# Patient Record
Sex: Male | Born: 1970 | Race: Black or African American | Hispanic: No | Marital: Married | Smoking: Never smoker
Health system: Southern US, Community
[De-identification: ages and names within clinical notes are randomized; demographics above are authoritative.]

## PROBLEM LIST (undated history)

## (undated) DIAGNOSIS — F319 Bipolar disorder, unspecified: Secondary | ICD-10-CM

## (undated) DIAGNOSIS — G629 Polyneuropathy, unspecified: Secondary | ICD-10-CM

## (undated) DIAGNOSIS — R0600 Dyspnea, unspecified: Secondary | ICD-10-CM

## (undated) DIAGNOSIS — G459 Transient cerebral ischemic attack, unspecified: Secondary | ICD-10-CM

## (undated) DIAGNOSIS — Z531 Procedure and treatment not carried out because of patient's decision for reasons of belief and group pressure: Secondary | ICD-10-CM

## (undated) DIAGNOSIS — J45909 Unspecified asthma, uncomplicated: Secondary | ICD-10-CM

## (undated) DIAGNOSIS — I6529 Occlusion and stenosis of unspecified carotid artery: Secondary | ICD-10-CM

## (undated) DIAGNOSIS — I5032 Chronic diastolic (congestive) heart failure: Secondary | ICD-10-CM

## (undated) DIAGNOSIS — E785 Hyperlipidemia, unspecified: Secondary | ICD-10-CM

## (undated) DIAGNOSIS — F32A Depression, unspecified: Secondary | ICD-10-CM

## (undated) DIAGNOSIS — K219 Gastro-esophageal reflux disease without esophagitis: Secondary | ICD-10-CM

## (undated) DIAGNOSIS — F329 Major depressive disorder, single episode, unspecified: Secondary | ICD-10-CM

## (undated) DIAGNOSIS — H409 Unspecified glaucoma: Secondary | ICD-10-CM

## (undated) DIAGNOSIS — E119 Type 2 diabetes mellitus without complications: Secondary | ICD-10-CM

## (undated) DIAGNOSIS — G4700 Insomnia, unspecified: Secondary | ICD-10-CM

## (undated) DIAGNOSIS — R079 Chest pain, unspecified: Secondary | ICD-10-CM

## (undated) DIAGNOSIS — E559 Vitamin D deficiency, unspecified: Secondary | ICD-10-CM

## (undated) DIAGNOSIS — D649 Anemia, unspecified: Secondary | ICD-10-CM

## (undated) DIAGNOSIS — K922 Gastrointestinal hemorrhage, unspecified: Secondary | ICD-10-CM

## (undated) DIAGNOSIS — I10 Essential (primary) hypertension: Secondary | ICD-10-CM

## (undated) DIAGNOSIS — F418 Other specified anxiety disorders: Secondary | ICD-10-CM

## (undated) DIAGNOSIS — K2900 Acute gastritis without bleeding: Secondary | ICD-10-CM

## (undated) DIAGNOSIS — IMO0001 Reserved for inherently not codable concepts without codable children: Secondary | ICD-10-CM

## (undated) HISTORY — PX: INGUINAL HERNIA REPAIR: SUR1180

## (undated) HISTORY — PX: TONSILLECTOMY: SUR1361

## (undated) HISTORY — DX: Unspecified glaucoma: H40.9

## (undated) HISTORY — DX: Acute gastritis without bleeding: K29.00

## (undated) HISTORY — PX: CIRCUMCISION: SUR203

---

## 2014-08-25 ENCOUNTER — Emergency Department (HOSPITAL_COMMUNITY): Payer: Self-pay

## 2014-08-25 ENCOUNTER — Encounter (HOSPITAL_COMMUNITY): Payer: Self-pay | Admitting: Emergency Medicine

## 2014-08-25 ENCOUNTER — Inpatient Hospital Stay (HOSPITAL_COMMUNITY)
Admission: EM | Admit: 2014-08-25 | Discharge: 2014-08-26 | DRG: 069 | Disposition: A | Discharge status: Home / Self Care | Payer: Self-pay | Attending: Internal Medicine | Admitting: Internal Medicine

## 2014-08-25 ENCOUNTER — Observation Stay (HOSPITAL_COMMUNITY): Payer: Self-pay

## 2014-08-25 DIAGNOSIS — I739 Peripheral vascular disease, unspecified: Secondary | ICD-10-CM | POA: Diagnosis present

## 2014-08-25 DIAGNOSIS — R531 Weakness: Secondary | ICD-10-CM | POA: Insufficient documentation

## 2014-08-25 DIAGNOSIS — G44011 Episodic cluster headache, intractable: Secondary | ICD-10-CM

## 2014-08-25 DIAGNOSIS — R519 Headache, unspecified: Secondary | ICD-10-CM

## 2014-08-25 DIAGNOSIS — K921 Melena: Secondary | ICD-10-CM | POA: Diagnosis present

## 2014-08-25 DIAGNOSIS — I160 Hypertensive urgency: Secondary | ICD-10-CM | POA: Diagnosis present

## 2014-08-25 DIAGNOSIS — G459 Transient cerebral ischemic attack, unspecified: Principal | ICD-10-CM

## 2014-08-25 DIAGNOSIS — R51 Headache: Secondary | ICD-10-CM

## 2014-08-25 DIAGNOSIS — E119 Type 2 diabetes mellitus without complications: Secondary | ICD-10-CM

## 2014-08-25 DIAGNOSIS — R471 Dysarthria and anarthria: Secondary | ICD-10-CM | POA: Diagnosis present

## 2014-08-25 DIAGNOSIS — M6289 Other specified disorders of muscle: Secondary | ICD-10-CM

## 2014-08-25 DIAGNOSIS — I10 Essential (primary) hypertension: Secondary | ICD-10-CM

## 2014-08-25 DIAGNOSIS — I2510 Atherosclerotic heart disease of native coronary artery without angina pectoris: Secondary | ICD-10-CM | POA: Diagnosis present

## 2014-08-25 DIAGNOSIS — Z9114 Patient's other noncompliance with medication regimen: Secondary | ICD-10-CM | POA: Diagnosis present

## 2014-08-25 DIAGNOSIS — E1169 Type 2 diabetes mellitus with other specified complication: Secondary | ICD-10-CM

## 2014-08-25 DIAGNOSIS — I6529 Occlusion and stenosis of unspecified carotid artery: Secondary | ICD-10-CM | POA: Insufficient documentation

## 2014-08-25 DIAGNOSIS — E785 Hyperlipidemia, unspecified: Secondary | ICD-10-CM | POA: Insufficient documentation

## 2014-08-25 DIAGNOSIS — E876 Hypokalemia: Secondary | ICD-10-CM | POA: Diagnosis present

## 2014-08-25 DIAGNOSIS — G8194 Hemiplegia, unspecified affecting left nondominant side: Secondary | ICD-10-CM | POA: Diagnosis present

## 2014-08-25 DIAGNOSIS — Z79899 Other long term (current) drug therapy: Secondary | ICD-10-CM

## 2014-08-25 DIAGNOSIS — G451 Carotid artery syndrome (hemispheric): Secondary | ICD-10-CM | POA: Insufficient documentation

## 2014-08-25 DIAGNOSIS — I639 Cerebral infarction, unspecified: Secondary | ICD-10-CM

## 2014-08-25 HISTORY — DX: Essential (primary) hypertension: I10

## 2014-08-25 LAB — CREATININE, SERUM: CREATININE: 0.72 mg/dL (ref 0.50–1.35)

## 2014-08-25 LAB — I-STAT CHEM 8, ED
BUN: 8 mg/dL (ref 6–23)
CALCIUM ION: 1.21 mmol/L (ref 1.12–1.23)
CREATININE: 0.8 mg/dL (ref 0.50–1.35)
Chloride: 102 mmol/L (ref 96–112)
Glucose, Bld: 291 mg/dL — ABNORMAL HIGH (ref 70–99)
HCT: 46 % (ref 39.0–52.0)
Hemoglobin: 15.6 g/dL (ref 13.0–17.0)
Potassium: 4 mmol/L (ref 3.5–5.1)
SODIUM: 141 mmol/L (ref 135–145)
TCO2: 27 mmol/L (ref 0–100)

## 2014-08-25 LAB — CBC WITH DIFFERENTIAL/PLATELET
Basophils Absolute: 0 10*3/uL (ref 0.0–0.1)
Basophils Relative: 1 % (ref 0–1)
EOS PCT: 1 % (ref 0–5)
Eosinophils Absolute: 0 10*3/uL (ref 0.0–0.7)
HCT: 42.1 % (ref 39.0–52.0)
Hemoglobin: 14.3 g/dL (ref 13.0–17.0)
LYMPHS ABS: 2 10*3/uL (ref 0.7–4.0)
Lymphocytes Relative: 31 % (ref 12–46)
MCH: 30 pg (ref 26.0–34.0)
MCHC: 34 g/dL (ref 30.0–36.0)
MCV: 88.3 fL (ref 78.0–100.0)
MONOS PCT: 6 % (ref 3–12)
Monocytes Absolute: 0.4 10*3/uL (ref 0.1–1.0)
NEUTROS ABS: 4.1 10*3/uL (ref 1.7–7.7)
Neutrophils Relative %: 61 % (ref 43–77)
Platelets: 210 10*3/uL (ref 150–400)
RBC: 4.77 MIL/uL (ref 4.22–5.81)
RDW: 12.4 % (ref 11.5–15.5)
WBC: 6.6 10*3/uL (ref 4.0–10.5)

## 2014-08-25 LAB — GLUCOSE, CAPILLARY
GLUCOSE-CAPILLARY: 210 mg/dL — AB (ref 70–99)
Glucose-Capillary: 163 mg/dL — ABNORMAL HIGH (ref 70–99)

## 2014-08-25 LAB — TROPONIN I

## 2014-08-25 LAB — I-STAT TROPONIN, ED: Troponin i, poc: 0 ng/mL (ref 0.00–0.08)

## 2014-08-25 MED ORDER — HYDRALAZINE HCL 20 MG/ML IJ SOLN
10.0000 mg | Freq: Four times a day (QID) | INTRAMUSCULAR | Status: DC | PRN
  Administered 2014-08-26: 10 mg via INTRAVENOUS
  Filled 2014-08-25: qty 1

## 2014-08-25 MED ORDER — ASPIRIN 81 MG PO CHEW
324.0000 mg | CHEWABLE_TABLET | Freq: Once | ORAL | Status: DC
  Filled 2014-08-25: qty 4

## 2014-08-25 MED ORDER — CARVEDILOL 6.25 MG PO TABS: 6.2500 mg | ORAL_TABLET | Freq: Two times a day (BID) | ORAL | Status: DC

## 2014-08-25 MED ORDER — GUAIFENESIN-DM 100-10 MG/5ML PO SYRP: 5.0000 mL | ORAL_SOLUTION | ORAL | Status: DC | PRN

## 2014-08-25 MED ORDER — HEPARIN SODIUM (PORCINE) 5000 UNIT/ML IJ SOLN
5000.0000 [IU] | Freq: Three times a day (TID) | INTRAMUSCULAR | Status: DC
  Administered 2014-08-25 – 2014-08-26 (×4): 5000 [IU] via SUBCUTANEOUS
  Filled 2014-08-25 (×4): qty 1

## 2014-08-25 MED ORDER — SODIUM CHLORIDE 0.9 % IJ SOLN
3.0000 mL | Freq: Two times a day (BID) | INTRAMUSCULAR | Status: DC
  Administered 2014-08-26: 3 mL via INTRAVENOUS

## 2014-08-25 MED ORDER — CARVEDILOL 3.125 MG PO TABS
3.1250 mg | ORAL_TABLET | Freq: Two times a day (BID) | ORAL | Status: DC
  Filled 2014-08-25 (×2): qty 1

## 2014-08-25 MED ORDER — MORPHINE SULFATE 4 MG/ML IJ SOLN
4.0000 mg | Freq: Once | INTRAMUSCULAR | Status: AC
  Administered 2014-08-25: 4 mg via INTRAVENOUS
  Filled 2014-08-25: qty 1

## 2014-08-25 MED ORDER — HYDRALAZINE HCL 20 MG/ML IJ SOLN: 10.0000 mg | Freq: Four times a day (QID) | INTRAMUSCULAR | Status: DC | PRN

## 2014-08-25 MED ORDER — HYDRALAZINE HCL 20 MG/ML IJ SOLN
5.0000 mg | Freq: Once | INTRAMUSCULAR | Status: AC
  Administered 2014-08-25: 5 mg via INTRAVENOUS
  Filled 2014-08-25: qty 1

## 2014-08-25 MED ORDER — ASPIRIN 81 MG PO CHEW
81.0000 mg | CHEWABLE_TABLET | Freq: Every day | ORAL | Status: DC
  Administered 2014-08-25 – 2014-08-26 (×2): 81 mg via ORAL
  Filled 2014-08-25: qty 1

## 2014-08-25 MED ORDER — ACETAMINOPHEN 325 MG PO TABS
650.0000 mg | ORAL_TABLET | ORAL | Status: DC | PRN
  Administered 2014-08-25 – 2014-08-26 (×3): 650 mg via ORAL
  Filled 2014-08-25 (×3): qty 2

## 2014-08-25 MED ORDER — STROKE: EARLY STAGES OF RECOVERY BOOK
Freq: Once | Status: AC
  Administered 2014-08-25: 18:00:00
  Filled 2014-08-25: qty 1

## 2014-08-25 MED ORDER — NITROGLYCERIN 0.4 MG SL SUBL
0.4000 mg | SUBLINGUAL_TABLET | SUBLINGUAL | Status: DC | PRN
  Administered 2014-08-25 – 2014-08-26 (×2): 0.4 mg via SUBLINGUAL
  Filled 2014-08-25 (×2): qty 1

## 2014-08-25 MED ORDER — ONDANSETRON HCL 4 MG PO TABS
4.0000 mg | ORAL_TABLET | Freq: Four times a day (QID) | ORAL | Status: DC | PRN
Start: 2014-08-25 — End: 2014-08-26

## 2014-08-25 MED ORDER — INSULIN GLARGINE 100 UNIT/ML ~~LOC~~ SOLN
10.0000 [IU] | Freq: Every day | SUBCUTANEOUS | Status: DC
  Administered 2014-08-25 – 2014-08-26 (×2): 10 [IU] via SUBCUTANEOUS
  Filled 2014-08-25 (×2): qty 0.1

## 2014-08-25 MED ORDER — SODIUM CHLORIDE 0.9 % IV BOLUS (SEPSIS)
1000.0000 mL | Freq: Once | INTRAVENOUS | Status: AC
  Administered 2014-08-25: 1000 mL via INTRAVENOUS

## 2014-08-25 MED ORDER — ALUM & MAG HYDROXIDE-SIMETH 200-200-20 MG/5ML PO SUSP: 30.0000 mL | Freq: Four times a day (QID) | ORAL | Status: DC | PRN

## 2014-08-25 MED ORDER — INSULIN ASPART 100 UNIT/ML ~~LOC~~ SOLN
0.0000 [IU] | Freq: Three times a day (TID) | SUBCUTANEOUS | Status: DC
  Administered 2014-08-25 – 2014-08-26 (×2): 3 [IU] via SUBCUTANEOUS
  Administered 2014-08-26: 2 [IU] via SUBCUTANEOUS
  Administered 2014-08-26: 5 [IU] via SUBCUTANEOUS

## 2014-08-25 MED ORDER — ONDANSETRON HCL 4 MG/2ML IJ SOLN: 4.0000 mg | Freq: Four times a day (QID) | INTRAMUSCULAR | Status: DC | PRN

## 2014-08-25 MED ORDER — POLYETHYLENE GLYCOL 3350 17 G PO PACK: 17.0000 g | PACK | Freq: Every day | ORAL | Status: DC | PRN

## 2014-08-25 MED ORDER — AMLODIPINE BESYLATE 5 MG PO TABS: 10.0000 mg | ORAL_TABLET | Freq: Every day | ORAL | Status: DC

## 2014-08-25 NOTE — H&P (Addendum)
Patient Demographics  Toddrick Sanna, is a 44 y.o. male  MRN: [id number]   DOB - [birth year]  Admit Date - 08/25/2014  Outpatient Primary MD for the patient is No primary care provider on file.   With History of -  Past Medical History  Diagnosis Date  . Hypertension   . Diabetes mellitus without complication   . Coronary artery disease       Past Surgical History  Procedure Laterality Date  . Hernia repair      in for   Chief Complaint  Patient presents with  . Numbness  . Hypertension     HPI  Bethany Cumming  is a 44 y.o. male,  with history of hypertension and type 2 diabetes mellitus for which she's been out of medications for the last 7 months, he denies any knowledge of CAD although it's listed, he comes in with 18 hour history of mild generalized headache, left-sided chest pressure which is constant, nonradiating, no shortness of breath, no relation with exertion, he started experiencing some left-sided tingling and numbness last night, this morning he woke up and he had some weakness in the left leg as well, came to the ER where his blood pressure was over 200/110, head CT unremarkable, EKG nonacute, blood work stable I was called to admit the patient for hypertensive urgency/crisis.   Patient besides above dictated problems no other subjective complaints.    Review of Systems    In addition to the HPI above,   No Fever-chills, No Headache, No changes with Vision or hearing, No problems swallowing food or Liquids, As above Chest pain, Cough or Shortness of Breath, No Abdominal pain, No Nausea or Vommitting, Bowel movements are regular, No Blood  Urine, has noticed some blood in stool intermittently No dysuria, No new skin rashes or bruises, No new joints pains-aches,  As above weakness,  tingling, numbness , No recent weight gain or loss, No polyuria, polydypsia or polyphagia, No significant Mental Stressors.  A full 10 point Review of Systems was done, except as stated above, all other Review of Systems were negative.   Social History History  Substance Use Topics  . Smoking status: Never Smoker   . Smokeless tobacco: Never Used  . Alcohol Use: No      Family History Family History  Problem Relation Age of Onset  . Hypertension Mother   . Diabetes Mother   . Hyperlipidemia Mother   . Diabetes Father   . Hypertension Father   . Stroke Father       Prior to Admission medications   Medication Sig Start Date End Date Taking? Authorizing Provider  ibuprofen (ADVIL,MOTRIN) 200 MG tablet Take 400 mg by mouth every 6 (six) hours as needed for mild pain or moderate pain.   Yes Historical Provider, MD    No Known Allergies  Physical Exam  Vitals  Blood pressure 164/98, pulse 95, temperature 98.5 F (  36.9 C), temperature source Oral, resp. rate 16, SpO2 96 %.   1. General middle-aged African-American male lying in bed in NAD,     2. Normal affect and insight, Not Suicidal or Homicidal, Awake Alert, Oriented X 3.  3. No F.N deficits, ALL C.Nerves Intact, Strength 5/5 all 4 extremities he does have mild pronator drift on the left side, some tingling numbness in the left leg, Plantars down going.  4. Ears and Eyes appear Normal, Conjunctivae clear, PERRLA. Moist Oral Mucosa.  5. Supple Neck, No JVD, No cervical lymphadenopathy appriciated, No Carotid Bruits.  6. Symmetrical Chest wall movement, Good air movement bilaterally, CTAB.  7. RRR, No Gallops, Rubs or Murmurs, No Parasternal Heave.  8. Positive Bowel Sounds, Abdomen Soft, No tenderness, No organomegaly appriciated,No rebound -guarding or rigidity.  9.  No Cyanosis, Normal Skin Turgor, No Skin Rash or Bruise.  10. Good muscle tone,  joints appear normal , no effusions, Normal ROM.  11. No  Palpable Lymph Nodes in Neck or Axillae     Data Review  CBC  Recent Labs Lab 08/25/14 1406 08/25/14 1419  WBC 6.6  --   HGB 14.3 15.6  HCT 42.1 46.0  PLT 210  --   MCV 88.3  --   MCH 30.0  --   MCHC 34.0  --   RDW 12.4  --   LYMPHSABS 2.0  --   MONOABS 0.4  --   EOSABS 0.0  --   BASOSABS 0.0  --    ------------------------------------------------------------------------------------------------------------------  Chemistries   Recent Labs Lab 08/25/14 1419  NA 141  K 4.0  CL 102  GLUCOSE 291*  BUN 8  CREATININE 0.80   ------------------------------------------------------------------------------------------------------------------ CrCl cannot be calculated (Unknown ideal weight.). ------------------------------------------------------------------------------------------------------------------ No results for input(s): TSH, T4TOTAL, T3FREE, THYROIDAB in the last 72 hours.  Invalid input(s): FREET3   Coagulation profile No results for input(s): INR, PROTIME in the last 168 hours. ------------------------------------------------------------------------------------------------------------------- No results for input(s): DDIMER in the last 72 hours. -------------------------------------------------------------------------------------------------------------------  Cardiac Enzymes No results for input(s): CKMB, TROPONINI, MYOGLOBIN in the last 168 hours.  Invalid input(s): CK ------------------------------------------------------------------------------------------------------------------ Invalid input(s): POCBNP   ---------------------------------------------------------------------------------------------------------------  Urinalysis No results found for: COLORURINE, APPEARANCEUR, LABSPEC, PHURINE, GLUCOSEU, HGBUR, BILIRUBINUR, KETONESUR, PROTEINUR, UROBILINOGEN, NITRITE,  LEUKOCYTESUR  ----------------------------------------------------------------------------------------------------------------  Imaging results:   Dg Chest 2 View  08/25/2014   CLINICAL DATA:  Midsternal chest pain since last night. Night sweats. Hypertension. Diabetes. Nonsmoker.  EXAM: CHEST  2 VIEW  COMPARISON:  None.  FINDINGS: Midline trachea.  Normal heart size and mediastinal contours.  Sharp costophrenic angles.  No pneumothorax.  Clear lungs.  IMPRESSION: No active cardiopulmonary disease.   Electronically Signed   By: Abigail Miyamoto M.D.   On: 08/25/2014 15:02   Ct Head Wo Contrast  08/25/2014   CLINICAL DATA:  44 year old with headache and blurry vision  EXAM: CT HEAD WITHOUT CONTRAST  TECHNIQUE: Contiguous axial images were obtained from the base of the skull through the vertex without intravenous contrast.  COMPARISON:  None.  FINDINGS: No acute cortical infarct, hemorrhage, or mass lesion ispresent. Ventricles are of normal size. No significant extra-axial fluid collection is present. The paranasal sinuses andmastoid air cells are clear. The osseous skull is intact.  IMPRESSION: 1. Normal brain.   Electronically Signed   By: Kerby Moors M.D.   On: 08/25/2014 14:58    My personal review of EKG: Rhythm NSR, Rate 90 /min, mild LVH , no Acute ST changes  Assessment & Plan  1. Left-sided tingling numbness and weakness. This likely is TIA versus CVA, although hypertensive crisis cannot be completely ruled out. He will be admitted to a telemetry bed, CT head is unremarkable, we will initiate for stroke protocol, MRI MRA brain, echogram, carotid duplex, PT-OT-speech eval. Check A1c lipid panel, Aspirin 81 mg daily. Will request neurology to evaluate as well.   2. Left-sided chest pressure. Now almost resolved, this likely is due to elevated blood pressures, EKG is nonacute, cycle troponin, obtain echogram, 81 of aspirin. Hydralazine when necessary as needed for blood pressure above  200/100 as he is having chest pain.   3. DM type II. Out of medications for the last 7 months. Check A1c, low-dose Lantus and sliding scale for now.   4. Essential hypertension with possible hypertensive crisis/urgency. This could be reactionary to TIA/CVA, hence when necessary hydralazine only for now, if stroke is completely ruled out we will initiate scheduled blood pressure medications and bring blood pressure down gently.   5. Blood in stool intermittently. On exam no external hemorrhoids, no rectal mass, we will request him to follow with a GI physician post discharge and get a colonoscopy. His paternal uncle did have colon cancer. He is currently not anemic.    DVT Prophylaxis Heparin   AM Labs Ordered, also please review Full Orders  Family Communication: Admission, patients condition and plan of care including tests being ordered have been discussed with the patient  who indicates understanding and agree with the plan and Code Status.  Code Status Full  Likely DC to  home  Condition Fair  Time spent in minutes : 35    Willy Pinkerton K M.D on 08/25/2014 at 3:54 PM  Between 7am to 7pm - Pager - [phone]  After 7pm go to [email or web address] - password [facility]  [facility]  Office  [phone]

## 2014-08-25 NOTE — Consult Note (Signed)
Referring Physician: Dr. Ronnie Derby    Chief Complaint: left hemiparesis, left hemisensory impairment, dysarthria  HPI:                                                                                                                                         Shane [name] is an 44 y.o. male with a past medical history significant for HTN, DM, hyperlipidemia, CAD, poor adherence to treatment, admitted to [facility] for further evaluation of the above stated symptoms. Shane [name] stated that last night he started having a moderately severe HA that was later on followed by chest pain. He went to sleep and woke up in the middle of the night with numbness of the left arm. Went back to sleep, and when he woke again this morning the left leg was also numb and weak as well as the left arm. Wife noticed that he was slurring his words but no evidence of face weakness, no vertigo, double vision, difficulty swallowing, language or vision impairment. He presented to the ED this afternoon and was noted to have blood pressure was over 200/110. CT brain was personally reviewed and showed no acute abnormality. EKG showed no ischemia or atrial fibrillation. Of note, patient informed me that he hasn't been taking his BP medications for several months.  Date last known well: 08/25/14 Time last known well: unknown tPA Given: no, late presentation   Past Medical History  Diagnosis Date  . Hypertension   . Diabetes mellitus without complication   . Coronary artery disease     Past Surgical History  Procedure Laterality Date  . Hernia repair      Family History  Problem Relation Age of Onset  . Hypertension Mother   . Diabetes Mother   . Hyperlipidemia Mother   . Diabetes Father   . Hypertension Father   . Stroke Father    Social History:  reports that he has never smoked. He has never used smokeless tobacco. He reports that he does not drink alcohol or use illicit drugs. Family history: no epilepsy, brain tumors, or  brain aneurysms Allergies: No Known Allergies  Medications:                                                                                                                           Scheduled: .  stroke: mapping our early stages of  recovery book   Does not apply Once  . aspirin  81 mg Oral Daily  . heparin  5,000 Units Subcutaneous 3 times per day  . insulin aspart  0-9 Units Subcutaneous TID WC  . insulin glargine  10 Units Subcutaneous Daily  . sodium chloride  3 mL Intravenous Q12H    ROS:                                                                                                                                       History obtained from the patient and chart review  General ROS: negative for - chills, fatigue, fever, night sweats, weight gain or weight loss Psychological ROS: negative for - behavioral disorder, hallucinations, memory difficulties, mood swings or suicidal ideation Ophthalmic ROS: negative for - blurry vision, double vision, eye pain or loss of vision ENT ROS: negative for - epistaxis, nasal discharge, oral lesions, sore throat, tinnitus or vertigo Allergy and Immunology ROS: negative for - hives or itchy/watery eyes Hematological and Lymphatic ROS: negative for - bleeding problems, bruising or swollen lymph nodes Endocrine ROS: negative for - galactorrhea, hair pattern changes, polydipsia/polyuria or temperature intolerance Respiratory ROS: negative for - cough, hemoptysis, shortness of breath or wheezing Cardiovascular ROS: negative for - dyspnea on exertion, edema or irregular heartbeat Gastrointestinal ROS: negative for - abdominal pain, diarrhea, hematemesis, nausea/vomiting or stool incontinence Genito-Urinary ROS: negative for - dysuria, hematuria, incontinence or urinary frequency/urgency Musculoskeletal ROS: negative for - joint swelling Neurological ROS: as noted in HPI Dermatological ROS: negative for rash and skin lesion changes  Physical exam:  pleasant male in no apparent distress. Blood pressure 176/104, pulse 88, temperature 98.1 F (36.7 C), temperature source Oral, resp. rate 16, SpO2 100 %. Head: normocephalic. Neck: supple, no bruits, no JVD. Cardiac: no murmurs. Lungs: clear. Abdomen: soft, no tender, no mass. Extremities: no edema. Skin: no rash Neurologic Examination:                                                                                                      General: Mental Status: Alert, oriented, thought content appropriate.  Speech fluent without evidence of aphasia.  Able to follow 3 step commands without difficulty. Cranial Nerves: II: Discs flat bilaterally; Visual fields grossly normal, pupils equal, round, reactive to light and accommodation III,IV, VI: ptosis not present, extra-ocular motions intact bilaterally V,VII: smile symmetric, facial light touch sensation impaired in hen left face VIII: hearing normal bilaterally IX,X: uvula rises symmetrically XI: bilateral  shoulder shrug XII: midline tongue extension without atrophy or fasciculations Motor: Significant for mild left hemiparesis Tone and bulk:normal tone throughout; no atrophy noted Sensory: Pinprick and light touch diminished in the left side Deep Tendon Reflexes:  Right: Upper Extremity   Left: Upper extremity   biceps (C-5 to C-6) 2/4   biceps (C-5 to C-6) 2/4 tricep (C7) 2/4    triceps (C7) 2/4 Brachioradialis (C6) 2/4  Brachioradialis (C6) 2/4  Lower Extremity Lower Extremity  quadriceps (L-2 to L-4) 2/4   quadriceps (L-2 to L-4) 2/4 Achilles (S1) 2/4   Achilles (S1) 2/4  Plantars: Right: downgoing   Left: downgoing Cerebellar: normal finger-to-nose,  normal heel-to-shin test Gait:  No tested for safety reasons.  Results for orders placed or performed during the hospital encounter of 08/25/14 (from the past 48 hour(s))  CBC with Differential/Platelet     Status: None   Collection Time: 08/25/14  2:06 PM  Result Value  Ref Range   WBC 6.6 4.0 - 10.5 K/uL   RBC 4.77 4.22 - 5.81 MIL/uL   Hemoglobin 14.3 13.0 - 17.0 g/dL   HCT 42.1 39.0 - 52.0 %   MCV 88.3 78.0 - 100.0 fL   MCH 30.0 26.0 - 34.0 pg   MCHC 34.0 30.0 - 36.0 g/dL   RDW 12.4 11.5 - 15.5 %   Platelets 210 150 - 400 K/uL   Neutrophils Relative % 61 43 - 77 %   Neutro Abs 4.1 1.7 - 7.7 K/uL   Lymphocytes Relative 31 12 - 46 %   Lymphs Abs 2.0 0.7 - 4.0 K/uL   Monocytes Relative 6 3 - 12 %   Monocytes Absolute 0.4 0.1 - 1.0 K/uL   Eosinophils Relative 1 0 - 5 %   Eosinophils Absolute 0.0 0.0 - 0.7 K/uL   Basophils Relative 1 0 - 1 %   Basophils Absolute 0.0 0.0 - 0.1 K/uL  I-stat troponin, ED     Status: None   Collection Time: 08/25/14  2:17 PM  Result Value Ref Range   Troponin i, poc 0.00 0.00 - 0.08 ng/mL   Comment 3            Comment: Due to the release kinetics of cTnI, a negative result within the first hours of the onset of symptoms does not rule out myocardial infarction with certainty. If myocardial infarction is still suspected, repeat the test at appropriate intervals.   I-stat chem 8, ed     Status: Abnormal   Collection Time: 08/25/14  2:19 PM  Result Value Ref Range   Sodium 141 135 - 145 mmol/L   Potassium 4.0 3.5 - 5.1 mmol/L   Chloride 102 96 - 112 mmol/L   BUN 8 6 - 23 mg/dL   Creatinine, Ser 0.80 0.50 - 1.35 mg/dL   Glucose, Bld 291 (H) 70 - 99 mg/dL   Calcium, Ion 1.21 1.12 - 1.23 mmol/L   TCO2 27 0 - 100 mmol/L   Hemoglobin 15.6 13.0 - 17.0 g/dL   HCT 46.0 39.0 - 52.0 %  Creatinine, serum     Status: None   Collection Time: 08/25/14  3:48 PM  Result Value Ref Range   Creatinine, Ser 0.72 0.50 - 1.35 mg/dL   GFR calc non Af Amer >90 >90 mL/min   GFR calc Af Amer >90 >90 mL/min    Comment: (NOTE) The eGFR has been calculated using the CKD EPI equation. This calculation has not been validated in all clinical  situations. eGFR's persistently <90 mL/min signify possible Chronic Kidney Disease.    Dg  Chest 2 View  08/25/2014   CLINICAL DATA:  Midsternal chest pain since last night. Night sweats. Hypertension. Diabetes. Nonsmoker.  EXAM: CHEST  2 VIEW  COMPARISON:  None.  FINDINGS: Midline trachea.  Normal heart size and mediastinal contours.  Sharp costophrenic angles.  No pneumothorax.  Clear lungs.  IMPRESSION: No active cardiopulmonary disease.   Electronically Signed   By: Abigail Miyamoto M.D.   On: 08/25/2014 15:02   Ct Head Wo Contrast  08/25/2014   CLINICAL DATA:  44 year old with headache and blurry vision  EXAM: CT HEAD WITHOUT CONTRAST  TECHNIQUE: Contiguous axial images were obtained from the base of the skull through the vertex without intravenous contrast.  COMPARISON:  None.  FINDINGS: No acute cortical infarct, hemorrhage, or mass lesion ispresent. Ventricles are of normal size. No significant extra-axial fluid collection is present. The paranasal sinuses andmastoid air cells are clear. The osseous skull is intact.  IMPRESSION: 1. Normal brain.   Electronically Signed   By: Kerby Moors M.D.   On: 08/25/2014 14:58    Assessment: 45 y.o. male with acute onset mild left hemiparesis and hemisensory impairment in the context of a hypertensive crisis. Likely right subcortical lacunar infarct resulting of small vessel disease, poorly controlled HTN. CT brain unremarkable. Patient is out of the window for treatment with thrombolytics. Complete stroke work up. Initiate aspirin and statin after passing swallowing evaluation. Patient with poor adherence to treatment. Stroke team will follow up tomorrow.    Stroke Risk Factors - HTN, DM, hyperlipidemia  Plan: 1. HgbA1c, fasting lipid panel 2. MRI, MRA  of the brain without contrast 3. Echocardiogram 4. Carotid dopplers 5. Prophylactic therapy-aspirin after passing swallowing evaluation 6. Risk factor modification 7. Telemetry monitoring. 8. Frequent neuro checks 9. PT/OT SLP   Dorian Pod, MD [facility] [phone]  08/25/2014, 5:29 PM

## 2014-08-25 NOTE — ED Provider Notes (Signed)
CSN: [id number]     Arrival date & time 08/25/14  1324 History   First MD Initiated Contact with Patient 08/25/14 1330     Chief Complaint  Patient presents with  . Numbness  . Hypertension     (Consider location/radiation/quality/duration/timing/severity/associated sxs/prior Treatment) HPI   44 year old male with history of hypertension and diabetes brought here via EMS for evaluation of stroke symptoms. Patient report last night around 8 PM while he was sitting and watching TV he developed acute onset of sharp throbbing headache to his forehead that radiates to the back of his head. He then developed gradual onset of pain to his chest. Pain is described as a pressure heavy sensation to left side of chest, radiates down to his left arm and he also expressing tingling and numbness sensation to both left arm and left leg. Also endorsed lightheadedness and dizziness and diaphoretic. He took an ibuprofen and went to sleep.  He was having difficulty sleeping throughout the night and this morning symptoms still persist. He also endorsed blurred vision to both eyes ongoing for about a month. Currently endorses a 8 out of 10 persistent chest pain worsening with breathing. States EMS has not given him any specific treatment. He denies any history of heart disease but does endorse a family history of heart disease. He is a nonsmoker. Furthermore patient reports for the past 2 years he has had significant weight loss total of 75 pounds which he cannot account for. He also reports intermittent rectal bleeding with bright red blood. Last year was seen by his PCP and was recommended to see a GI specialist. He was scheduled for endoscopy and colonoscopy however he lost his job and cannot go for the procedure. He has no significant risk factors for PE or DVT.  No past medical history on file. No past surgical history on file. No family history on file. History  Substance Use Topics  . Smoking status: Not on  file  . Smokeless tobacco: Not on file  . Alcohol Use: Not on file    Review of Systems  All other systems reviewed and are negative.     Allergies  Review of patient's allergies indicates not on file.  Home Medications   Prior to Admission medications   Not on File   There were no vitals taken for this visit. Physical Exam  Constitutional: He is oriented to person, place, and time. He appears well-developed and well-nourished. No distress.  HENT:  Head: Atraumatic.  No facial droop  Eyes: Conjunctivae and EOM are normal. Pupils are equal, round, and reactive to light.  Neck: Normal range of motion. Neck supple.  No nuchal rigidity  Cardiovascular: Normal rate, regular rhythm and intact distal pulses.   Pulmonary/Chest: Effort normal and breath sounds normal. No respiratory distress. He has no wheezes. He exhibits tenderness (Tenderness to left anterior chest wall on palpation without crepitus or emphysema. No overlying skin changes.).  Abdominal: Soft. There is no tenderness.  Musculoskeletal: He exhibits no edema.  5/5 strength to all 4 extremities.  Neurological: He is alert and oriented to person, place, and time.  Neurologic exam:  Speech clear, pupils equal round reactive to light, extraocular movements intact  Normal peripheral visual fields Cranial nerves III through XII normal including no facial droop Follows commands, moves all extremities x4, normal strength to bilateral upper and lower extremities at all major muscle groups including grip Sensation normal to light touch and pinprick Coordination intact, no limb ataxia, finger-nose-finger normal  Rapid alternating movements normal No pronator drift Gait normal   Skin: No rash noted.  Psychiatric: He has a normal mood and affect.  Nursing note and vitals reviewed.   ED Course  Procedures (including critical care time)  Patient here with strokelike symptoms and chest pain. He has no obvious focal neuro  deficit on exam, mild left arm weakness as compare to right. He has reproducible left chest wall pain without any obvious causative factor. Per EMS, patient has initial blood pressure of 218/120 not receiving any specific treatment. His blood pressure is 166/99 in ER. Workup initiated.  3:10 PM Blood pressure is fairly increasing to 194/115. Will give hydralazine 5 mg.  A CT without acute changes, chest x-ray shows no active cardiopulmonary disease. Chest pain is reproducible, likely atypical. At this time plan to consult to have patient admitted for hypertensive urgency. He may need brain MRI to rule out stroke.  Care discussed with Dr. Aline Brochure.    3:26 PM Appreciated consult from [place], Dr. Deno Etienne who agrees to admit pt to obs, tele for further care.   2:35 PM HEART score of 3.    3:59 PM Hospitalist, Dr. Deno Etienne evaluate patient and felt that this is concerning for TIA/stroke. Patient does have a left arm drift. He request neurology consultation. I will consult neurology as requested.  4:52 PM I have consulted neurologist Dr. Armida Sans who will see and evaluate pt further.    Labs Review Labs Reviewed  I-STAT CHEM 8, ED - Abnormal; Notable for the following:    Glucose, Bld 291 (*)    All other components within normal limits  CBC WITH DIFFERENTIAL/PLATELET  Randolm Idol, ED    Imaging Review Dg Chest 2 View  08/25/2014   CLINICAL DATA:  Midsternal chest pain since last night. Night sweats. Hypertension. Diabetes. Nonsmoker.  EXAM: CHEST  2 VIEW  COMPARISON:  None.  FINDINGS: Midline trachea.  Normal heart size and mediastinal contours.  Sharp costophrenic angles.  No pneumothorax.  Clear lungs.  IMPRESSION: No active cardiopulmonary disease.   Electronically Signed   By: Abigail Miyamoto M.D.   On: 08/25/2014 15:02   Ct Head Wo Contrast  08/25/2014   CLINICAL DATA:  44 year old with headache and blurry vision  EXAM: CT HEAD WITHOUT CONTRAST  TECHNIQUE: Contiguous axial  images were obtained from the base of the skull through the vertex without intravenous contrast.  COMPARISON:  None.  FINDINGS: No acute cortical infarct, hemorrhage, or mass lesion ispresent. Ventricles are of normal size. No significant extra-axial fluid collection is present. The paranasal sinuses andmastoid air cells are clear. The osseous skull is intact.  IMPRESSION: 1. Normal brain.   Electronically Signed   By: Kerby Moors M.D.   On: 08/25/2014 14:58     EKG Interpretation   Date/Time:  Thursday August 25 2014 13:42:33 EST Ventricular Rate:  90 PR Interval:  164 QRS Duration: 92 QT Interval:  368 QTC Calculation: 450 R Axis:   76 Text Interpretation:  Sinus rhythm Consider left atrial enlargement  Consider left ventricular hypertrophy Baseline wander in lead(s) II III  aVR aVL aVF Confirmed by HARRISON  MD, FORREST ([id number]) on 08/25/2014 1:51:47  PM      MDM   Final diagnoses:  Headache  Hypertensive urgency    BP 178/103 mmHg  Pulse 89  Temp(Src) 97.7 F (36.5 C) (Oral)  Resp 16  SpO2 96%  I have reviewed nursing notes and vital signs. I personally reviewed the imaging  tests through PACS system  I reviewed available ER/hospitalization records thought the EMR     Domenic Moras, PA-C 08/25/14 [place], PA-C 08/25/14 1653  Pamella Pert, MD 08/25/14 1711

## 2014-08-25 NOTE — ED Notes (Addendum)
Pt brought to Ed via EMS due to stroke symptoms and left sided chest pain. At 2100 last night pt had sudden onset of headache progressed to left arm and leg numbness, no difficulty with walking per EMS. Pt reports taking some ibuprofen last night to see if that would make symptoms go away, however pt woke up at 0500 am this morning and symptoms have gotten worse. Blurred vision in right eye and left side of face is numb per EMS. Pt reports calling EMS this morning due to left sided sharp chest pain that radiates into his back, worse with deep breathing. BP 218/120, pt has hx of HTN, however has not taken his BP meds in over 7 months. Pt is also diabetic and does not take any medication for that, when his "sugar feels high" he takes some of his wifes metformin. CBG 262 for EMS. Pt also reports "intermittent bloody diarrhea" was told by his PCP in [place] before moving here that it was possible he has colon cancer, pt has also experienced a large amount of unintentional weight loss of 75 lbs in two years.

## 2014-08-26 DIAGNOSIS — G451 Carotid artery syndrome (hemispheric): Secondary | ICD-10-CM

## 2014-08-26 DIAGNOSIS — E785 Hyperlipidemia, unspecified: Secondary | ICD-10-CM

## 2014-08-26 DIAGNOSIS — R079 Chest pain, unspecified: Secondary | ICD-10-CM

## 2014-08-26 DIAGNOSIS — E1149 Type 2 diabetes mellitus with other diabetic neurological complication: Secondary | ICD-10-CM

## 2014-08-26 DIAGNOSIS — I6529 Occlusion and stenosis of unspecified carotid artery: Secondary | ICD-10-CM | POA: Insufficient documentation

## 2014-08-26 DIAGNOSIS — I10 Essential (primary) hypertension: Secondary | ICD-10-CM

## 2014-08-26 DIAGNOSIS — I6523 Occlusion and stenosis of bilateral carotid arteries: Secondary | ICD-10-CM

## 2014-08-26 LAB — CBC
HCT: 40.8 % (ref 39.0–52.0)
Hemoglobin: 13.7 g/dL (ref 13.0–17.0)
MCH: 29.5 pg (ref 26.0–34.0)
MCHC: 33.6 g/dL (ref 30.0–36.0)
MCV: 87.9 fL (ref 78.0–100.0)
PLATELETS: 219 10*3/uL (ref 150–400)
RBC: 4.64 MIL/uL (ref 4.22–5.81)
RDW: 12.7 % (ref 11.5–15.5)
WBC: 6.9 10*3/uL (ref 4.0–10.5)

## 2014-08-26 LAB — BASIC METABOLIC PANEL
Anion gap: 9 (ref 5–15)
BUN: 7 mg/dL (ref 6–23)
CHLORIDE: 105 mmol/L (ref 96–112)
CO2: 26 mmol/L (ref 19–32)
Calcium: 9 mg/dL (ref 8.4–10.5)
Creatinine, Ser: 0.69 mg/dL (ref 0.50–1.35)
GFR calc non Af Amer: >90 mL/min (ref >90)
Glucose, Bld: 228 mg/dL — ABNORMAL HIGH (ref 70–99)
Potassium: 3.3 mmol/L — ABNORMAL LOW (ref 3.5–5.1)
Sodium: 140 mmol/L (ref 135–145)

## 2014-08-26 LAB — LIPID PANEL
CHOLESTEROL: 203 mg/dL — AB (ref 0–200)
HDL: 39 mg/dL — ABNORMAL LOW (ref >39)
LDL CALC: 123 mg/dL — AB (ref 0–99)
Total CHOL/HDL Ratio: 5.2 RATIO
Triglycerides: 204 mg/dL — ABNORMAL HIGH (ref <150)
VLDL: 41 mg/dL — ABNORMAL HIGH (ref 0–40)

## 2014-08-26 LAB — HIV ANTIBODY (ROUTINE TESTING W REFLEX): HIV SCREEN 4TH GENERATION: NONREACTIVE

## 2014-08-26 LAB — GLUCOSE, CAPILLARY
Glucose-Capillary: 234 mg/dL — ABNORMAL HIGH (ref 70–99)
Glucose-Capillary: 270 mg/dL — ABNORMAL HIGH (ref 70–99)
Glucose-Capillary: 160 mg/dL — ABNORMAL HIGH (ref 70–99)

## 2014-08-26 MED ORDER — BLOOD GLUCOSE MONITOR KIT: PACK | Status: DC

## 2014-08-26 MED ORDER — ASPIRIN 81 MG PO CHEW: 81.0000 mg | CHEWABLE_TABLET | Freq: Every day | ORAL | Status: DC

## 2014-08-26 MED ORDER — GLUCERNA SHAKE PO LIQD
237.0000 mL | Freq: Two times a day (BID) | ORAL | Status: DC
  Administered 2014-08-26: 237 mL via ORAL

## 2014-08-26 MED ORDER — HYDROCHLOROTHIAZIDE 12.5 MG PO CAPS: 12.5000 mg | ORAL_CAPSULE | Freq: Every day | ORAL | Status: DC

## 2014-08-26 MED ORDER — PRAVASTATIN SODIUM 40 MG PO TABS: 40.0000 mg | ORAL_TABLET | Freq: Every day | ORAL | Status: DC

## 2014-08-26 MED ORDER — PNEUMOCOCCAL VAC POLYVALENT 25 MCG/0.5ML IJ INJ: 0.5000 mL | INJECTION | INTRAMUSCULAR | Status: DC

## 2014-08-26 MED ORDER — INFLUENZA VAC SPLIT QUAD 0.5 ML IM SUSY: 0.5000 mL | PREFILLED_SYRINGE | INTRAMUSCULAR | Status: DC

## 2014-08-26 MED ORDER — CARVEDILOL 12.5 MG PO TABS: 12.5000 mg | ORAL_TABLET | Freq: Two times a day (BID) | ORAL | Status: DC

## 2014-08-26 MED ORDER — LISINOPRIL 20 MG PO TABS: 40.0000 mg | ORAL_TABLET | Freq: Every day | ORAL | Status: DC

## 2014-08-26 MED ORDER — ATORVASTATIN CALCIUM 40 MG PO TABS
40.0000 mg | ORAL_TABLET | Freq: Every day | ORAL | Status: DC
  Administered 2014-08-26: 40 mg via ORAL
  Filled 2014-08-26: qty 1

## 2014-08-26 MED ORDER — METFORMIN HCL 500 MG PO TABS: 500.0000 mg | ORAL_TABLET | Freq: Two times a day (BID) | ORAL | Status: DC

## 2014-08-26 NOTE — Progress Notes (Signed)
Discharge instruction reviewed with patient/family. All questions answered at this time. Transport by family.    Sigmund Morera, RN 

## 2014-08-26 NOTE — Progress Notes (Signed)
  Echocardiogram 2D Echocardiogram has been performed.  Shane [name] 08/26/2014, 12:03 PM

## 2014-08-26 NOTE — Discharge Summary (Signed)
Physician Discharge Summary  Shane [name] GYI:[id number] DOB: [birth year] DOA: 08/25/2014  PCP: No primary care provider on file.  Admit date: 08/25/2014 Discharge date: 08/26/2014  Time spent: 30 minutes  Recommendations for Outpatient Follow-up:  1. Close follow up to patient DM and BP; adjust medications as needed 2. Arrange outpatient referral with GI for colonoscopy screening 3. Repeat BMET to reassess electrolytes and renal fucntion (as patient started on antihypertensive agents and metformin) 4. Repeat LFT's and lipid panel in 3 months and adjust statins medications as needed   Discharge Diagnoses:  Active Problems:   Hypertensive urgency   TIA (transient ischemic attack)   DM2 (diabetes mellitus, type 2) HLD Intermittent BRBPR   Discharge Condition: stable and improved. Patient discharge home with instruction to follow at [facility] center to establish care and received follow up appointment. He didn't qualify for HHPT given lack of insurance; but per PT ok to get a cane for assistance and received instruction on activities to improve strength and overcome deficit.  Diet recommendation: heart healthy and low carbohydrates   Filed Weights   08/26/14 0500  Weight: 77.9 kg (171 lb 11.8 oz)    History of present illness:  44 y.o. male, with history of hypertension and type 2 diabetes mellitus for which she's been out of medications for the last 7 months, he denies any knowledge of CAD although it's listed, he comes in with 18 hour history of mild generalized headache, left-sided chest pressure which is constant, nonradiating, no shortness of breath, no relation with exertion, he started experiencing some left-sided tingling and numbness last night, this morning he woke up and he had some weakness in the left leg as well, came to the ER where his blood pressure was over 200/110, head CT unremarkable, EKG nonacute, blood work stable I was called to admit the patient for hypertensive  urgency/crisis.   Hospital Course:  1-TIA: with left sided weakness and numbness; most likely due to small vessel disease due to uncontrolled HTN -risk factors modifications (HTN, HLD, DM) -ASA for secondary prevention -patient to follow with neurology in 2 months as an outpatient for further evaluation and rec's  2-hypertensive urgency/accelerated HTN: controlled with medications -started at discharge on [employer] menu drugs -patient advise to take medications as prescribed and to follow heart healthy diet -discharge regimen includes HCTZ 12.5 mg, lisinopril 46m and coreg 12.5 mg BID  3-diabetes mellitus type 2: patient started on metformin BID -[facility] -he has not been taking any meds for > 7 months -advise to follow low carb diet  4-HLD: started on pravachol -he will follow low fat diet  5-hx of intermittent episode of BRBPR: none seen during admission -will need outpatient GI referral for colonoscopy screening  Procedures:  Carotid dopplers:1-39% stenosis bilaterally. Antegrade vertebral flow bilaterally.   2-D Echo: - Left ventricle: The cavity size was normal. Wall thickness was increased in a pattern of moderate LVH. Systolic function was normal. The estimated ejection fraction was in the range of 55% to 60%. Wall motion was normal; there were no regional wall motion abnormalities. Doppler parameters are consistent with abnormal left ventricular relaxation (grade 1 diastolic dysfunction). - Aortic valve: There was no stenosis. - Mitral valve: There was no significant regurgitation. - Right ventricle: The cavity size was normal. Systolic function was normal. - Pulmonary arteries: No complete TR doppler jet so unable to estimate PA systolic pressure. - Inferior vena cava: The vessel was  normal in size. The respirophasic diameter changes were in the normal range (>= 50%), consistent  with normal central venous pressure.  Impressions: - Normal LV size with moderate LV hypertrophy. EF 55-60%. Normal RV size and systolic function. No significant valvular abnormalities.  Consultations:  Neurology   Discharge Exam: Filed Vitals:   08/26/14 1300  BP: 124/83  Pulse: 94  Temp: 97.7 F (36.5 C)  Resp: 16    General: no HA's, feeling better; denies CP or SOB. Patient still with mild left sided weakness  Cardiovascular: no rubs or gallops; no JVD and no murmurs appreciated Respiratory: CTA bilaterally Abd: soft, NT, ND, positive BS Extremities: no edema or cyanosis Neurologic exam: no dysarthria, positive left sided weakness (MS 4/5 upper and lower extremities), no other focal deficit appreciated.   Discharge Instructions   Discharge Instructions    Diet - low sodium heart healthy    Complete by:  As directed      Discharge instructions    Complete by:  As directed   Take medications as prescribed Follow a low sodium (< 2.5-3 grams daily) and low carbohydrates diet Follow with Dr. Leonie Man (neurology in 2 months) Follow at [facility] center as instructed Please keep yourself well hydrated Check blood sugar BID (fasting and before bedtime)     Increase activity slowly    Complete by:  As directed           Current Discharge Medication List    START taking these medications   Details  aspirin 81 MG chewable tablet Chew 1 tablet (81 mg total) by mouth daily.    blood glucose meter kit and supplies KIT Dispense based on patient and insurance preference. Use up to four times daily as directed. (FOR ICD-9 250.00, 250.01). Qty: 1 each, Refills: 0    carvedilol (COREG) 12.5 MG tablet Take 1 tablet (12.5 mg total) by mouth 2 (two) times daily with a meal. Qty: 60 tablet, Refills: 1    hydrochlorothiazide (MICROZIDE) 12.5 MG capsule Take 1 capsule (12.5 mg total) by mouth daily. Qty: 30 capsule, Refills: 1    lisinopril (PRINIVIL,ZESTRIL) 20 MG tablet  Take 2 tablets (40 mg total) by mouth daily. Qty: 60 tablet, Refills: 1    metFORMIN (GLUCOPHAGE) 500 MG tablet Take 1 tablet (500 mg total) by mouth 2 (two) times daily with a meal. Qty: 60 tablet, Refills: 1    pravastatin (PRAVACHOL) 40 MG tablet Take 1 tablet (40 mg total) by mouth daily. Qty: 30 tablet, Refills: 1      STOP taking these medications     ibuprofen (ADVIL,MOTRIN) 200 MG tablet        No Known Allergies Follow-up Information    Follow up with [facility] In 1 week.   Why:  Walk-in appointments M-F, arrive at 8:30am. Bring all discharge paperwork to your appointment.   Contact information:   [address] [place] [address] [phone]      Follow up with SETHI,PRAMOD, MD. Call in 2 months.   Specialties:  Neurology, Radiology   Why:  contact office around this time to set up appointment   Contact information:   [address] [place] [address] [phone]       The results of significant diagnostics from this hospitalization (including imaging, microbiology, ancillary and laboratory) are listed below for reference.    Significant Diagnostic Studies: Dg Chest 2 View  08/25/2014   CLINICAL DATA:  Midsternal  chest pain since last night. Night sweats. Hypertension. Diabetes. Nonsmoker.  EXAM: CHEST  2 VIEW  COMPARISON:  None.  FINDINGS: Midline trachea.  Normal heart size and mediastinal contours.  Sharp costophrenic angles.  No pneumothorax.  Clear lungs.  IMPRESSION: No active cardiopulmonary disease.   Electronically Signed   By: Abigail Miyamoto M.D.   On: 08/25/2014 15:02   Ct Head Wo Contrast  08/25/2014   CLINICAL DATA:  44 year old with headache and blurry vision  EXAM: CT HEAD WITHOUT CONTRAST  TECHNIQUE: Contiguous axial images were obtained from the base of the skull through the vertex without intravenous contrast.  COMPARISON:  None.  FINDINGS: No acute cortical infarct, hemorrhage,  or mass lesion ispresent. Ventricles are of normal size. No significant extra-axial fluid collection is present. The paranasal sinuses andmastoid air cells are clear. The osseous skull is intact.  IMPRESSION: 1. Normal brain.   Electronically Signed   By: Kerby Moors M.D.   On: 08/25/2014 14:58   Mr Brain Wo Contrast  08/25/2014   CLINICAL DATA:  44 year old male with headache and right side weakness. Initial encounter.  EXAM: MRI HEAD WITHOUT CONTRAST  MRA HEAD WITHOUT CONTRAST  TECHNIQUE: Multiplanar, multiecho pulse sequences of the brain and surrounding structures were obtained without intravenous contrast. Angiographic images of the head were obtained using MRA technique without contrast.  COMPARISON:  Head CT without contrast 1446 hours today.  FINDINGS: MRI HEAD FINDINGS  Cerebral volume is normal. No restricted diffusion to suggest acute infarction. No midline shift, mass effect, evidence of mass lesion, ventriculomegaly, extra-axial collection or acute intracranial hemorrhage. Cervicomedullary junction and pituitary are within normal limits. Negative visualized cervical spine. Major intracranial vascular flow voids are within normal limits. [place] Cables and white matter signal is within normal limits throughout the brain.  Visible internal auditory structures appear normal. Mastoids are clear. Trace paranasal sinus mucosal thickening. Visualized orbit soft tissues are within normal limits. Visualized scalp soft tissues are within normal limits. Small right retropharyngeal lymph node is within normal limits. Normal bone marrow signal.  MRA HEAD FINDINGS  Antegrade flow in the posterior circulation with codominant distal vertebral arteries. Normal right PICA origin. Patent vertebrobasilar junction. Patent AICA origins. No basilar stenosis. SCA and PCA origins are within normal limits. Normal right posterior communicating artery, the left is diminutive or absent. Bilateral PCA branches are within normal limits.   Antegrade flow in both ICA siphons. No siphon stenosis. Ophthalmic and right posterior communicating artery origins are within normal limits. Normal carotid termini, MCA and ACA origins. Normal anterior communicating artery and visualized bilateral ACA branches. Normal visualized bilateral MCA branches.  IMPRESSION: 1.  Normal noncontrast MRI appearance of the brain. 2.  Negative intracranial MRA.   Electronically Signed   By: Genevie Ann M.D.   On: 08/25/2014 19:47   Mr Jodene Nam Head/brain Wo Cm  08/25/2014   CLINICAL DATA:  44 year old male with headache and right side weakness. Initial encounter.  EXAM: MRI HEAD WITHOUT CONTRAST  MRA HEAD WITHOUT CONTRAST  TECHNIQUE: Multiplanar, multiecho pulse sequences of the brain and surrounding structures were obtained without intravenous contrast. Angiographic images of the head were obtained using MRA technique without contrast.  COMPARISON:  Head CT without contrast 1446 hours today.  FINDINGS: MRI HEAD FINDINGS  Cerebral volume is normal. No restricted diffusion to suggest acute infarction. No midline shift, mass effect, evidence of mass lesion, ventriculomegaly, extra-axial collection or acute intracranial hemorrhage. Cervicomedullary junction and pituitary are within normal limits. Negative visualized cervical  spine. Major intracranial vascular flow voids are within normal limits. [place] Cables and white matter signal is within normal limits throughout the brain.  Visible internal auditory structures appear normal. Mastoids are clear. Trace paranasal sinus mucosal thickening. Visualized orbit soft tissues are within normal limits. Visualized scalp soft tissues are within normal limits. Small right retropharyngeal lymph node is within normal limits. Normal bone marrow signal.  MRA HEAD FINDINGS  Antegrade flow in the posterior circulation with codominant distal vertebral arteries. Normal right PICA origin. Patent vertebrobasilar junction. Patent AICA origins. No basilar stenosis. SCA  and PCA origins are within normal limits. Normal right posterior communicating artery, the left is diminutive or absent. Bilateral PCA branches are within normal limits.  Antegrade flow in both ICA siphons. No siphon stenosis. Ophthalmic and right posterior communicating artery origins are within normal limits. Normal carotid termini, MCA and ACA origins. Normal anterior communicating artery and visualized bilateral ACA branches. Normal visualized bilateral MCA branches.  IMPRESSION: 1.  Normal noncontrast MRI appearance of the brain. 2.  Negative intracranial MRA.   Electronically Signed   By: Genevie Ann M.D.   On: 08/25/2014 19:47   Labs: Basic Metabolic Panel:  Recent Labs Lab 08/25/14 1419 08/25/14 1548 08/26/14 0505  NA 141  --  140  K 4.0  --  3.3*  CL 102  --  105  CO2  --   --  26  GLUCOSE 291*  --  228*  BUN 8  --  7  CREATININE 0.80 0.72 0.69  CALCIUM  --   --  9.0   CBC:  Recent Labs Lab 08/25/14 1406 08/25/14 1419 08/26/14 0505  WBC 6.6  --  6.9  NEUTROABS 4.1  --   --   HGB 14.3 15.6 13.7  HCT 42.1 46.0 40.8  MCV 88.3  --  87.9  PLT 210  --  219   Cardiac Enzymes:  Recent Labs Lab 08/25/14 2111  TROPONINI <0.03   CBG:  Recent Labs Lab 08/25/14 1731 08/25/14 2221 08/26/14 0646 08/26/14 1138 08/26/14 1656  GLUCAP 210* 163* 234* 270* 160*    Signed:  Barton Dubois  [facility] 08/26/2014, 5:17 PM

## 2014-08-26 NOTE — Progress Notes (Signed)
STROKE TEAM PROGRESS NOTE   HISTORY Shane [name] is an 44 y.o. male with a past medical history significant for HTN, DM, hyperlipidemia, CAD, poor adherence to treatment, admitted to [facility] for further evaluation of left hemiparesis, left hemisensory impairment, and dysarthria. Shane [name] stated that last night he started having a moderately severe HA that was later on followed by chest pain. He went to sleep and woke up in the middle of the night with numbness of the left arm. Went back to sleep, and when he woke again this morning the left leg was also numb and weak as well as the left arm. Wife noticed that he was slurring his words but no evidence of face weakness, no vertigo, double vision, difficulty swallowing, language or vision impairment. He presented to the ED this afternoon and was noted to have blood pressure was over 200/110. CT brain was personally reviewed and showed no acute abnormality. EKG showed no ischemia or atrial fibrillation. Of note, patient informed me that he hasn't been taking his BP medications for several months.  Date last known well: 08/25/14 Time last known well: unknown tPA Given: no, late presentation   SUBJECTIVE (INTERVAL HISTORY) No family members present. The patient reports that he stopped taking his medications months ago because he had lost his job and could not afford to pay for the medications.   OBJECTIVE Temp:  [97.6 F (36.4 C)-98.5 F (36.9 C)] 97.8 F (36.6 C) (03/11 0515) Pulse Rate:  [81-95] 92 (03/11 0515) Cardiac Rhythm:  [-] Normal sinus rhythm (03/10 2005) Resp:  [0-24] 14 (03/11 0515) BP: (130-194)/(82-115) 173/104 mmHg (03/11 0515) SpO2:  [96 %-100 %] 98 % (03/11 0515) Weight:  [77.9 kg (171 lb 11.8 oz)] 77.9 kg (171 lb 11.8 oz) (03/11 0500)   Recent Labs Lab 08/25/14 1731 08/25/14 2221 08/26/14 0646  GLUCAP 210* 163* 234*    Recent Labs Lab 08/25/14 1419 08/25/14 1548 08/26/14 0505  NA 141  --  140  K 4.0  --  3.3*   CL 102  --  105  CO2  --   --  26  GLUCOSE 291*  --  228*  BUN 8  --  7  CREATININE 0.80 0.72 0.69  CALCIUM  --   --  9.0   No results for input(s): AST, ALT, ALKPHOS, BILITOT, PROT, ALBUMIN in the last 168 hours.  Recent Labs Lab 08/25/14 1406 08/25/14 1419 08/26/14 0505  WBC 6.6  --  6.9  NEUTROABS 4.1  --   --   HGB 14.3 15.6 13.7  HCT 42.1 46.0 40.8  MCV 88.3  --  87.9  PLT 210  --  219    Recent Labs Lab 08/25/14 2111  TROPONINI <0.03   No results for input(s): LABPROT, INR in the last 72 hours. No results for input(s): COLORURINE, LABSPEC, [place], GLUCOSEU, HGBUR, BILIRUBINUR, KETONESUR, PROTEINUR, UROBILINOGEN, NITRITE, LEUKOCYTESUR in the last 72 hours.  Invalid input(s): APPERANCEUR     Component Value Date/Time   CHOL 203* 08/26/2014 0505   TRIG 204* 08/26/2014 0505   HDL 39* 08/26/2014 0505   CHOLHDL 5.2 08/26/2014 0505   VLDL 41* 08/26/2014 0505   LDLCALC 123* 08/26/2014 0505   No results found for: HGBA1C No results found for: LABOPIA, COCAINSCRNUR, LABBENZ, AMPHETMU, THCU, LABBARB  No results for input(s): ETH in the last 168 hours.  Dg Chest 2 View 08/25/2014    No active cardiopulmonary disease.     Ct Head Wo Contrast 08/25/2014  Normal brain.   MRI / MRA Brain Wo Contrast 08/25/2014    1.  Normal noncontrast MRI appearance of the brain.  2.  Negative intracranial MRA.        PHYSICAL EXAM Pleasant young african [place] male not in distress. . Afebrile. Head is nontraumatic. Neck is supple without bruit.    Cardiac exam no murmur or gallop. Lungs are clear to auscultation. Distal pulses are well felt. Neurological Exam ;  Awake  Alert oriented x 3. Normal speech and language.eye movements full without nystagmus.fundi were not visualized. Vision acuity and fields appear normal. Hearing is normal. Palatal movements are normal. Face asymmetric when smiles with left nasolabial droop.. Tongue midline. Normal strength, tone, reflexes and  coordination. Except for mild diminished fine finger movements on the left and orbits right over left upper extremity. Normal sensation. Gait deferred.    ASSESSMENT/PLAN Shane [name] is a 44 y.o. male with history of  HTN, DM, hyperlipidemia, CAD, poor adherence to treatment, presenting with  left hemiparesis, left hemisensory impairment, and dysarthria. Marland Kitchen He did not receive IV t-PA due to late presentation.   TIA:  Non-dominant small vessel disease.  Resultant  deficits resolved  MRI  no infarct  MRA  negative  Carotid Doppler  pending  2D Echo  pending  LDL 123  HgbA1c pending  Subcutaneous heparin for VTE prophylaxis  Diet Carb Modified with thin liquids  no antithrombotic prior to admission, now on aspirin 81 mg orally every day  Patient counseled to be compliant with his antithrombotic medications  Ongoing aggressive stroke risk factor management  Therapy recommendations:  Pending  (no follow-up occupational therapy)  Disposition:  Pending  Hypertension  Home meds:  No antihypertensive medications prior to admission.  Blood pressure running high.   Recommend treating   Hyperlipidemia  Home meds: No lipid lowering medications prior to admission  LDL 123, goal < 70  Add - Lipitor 40 mg daily  Continue statin at discharge  Diabetes  HgbA1c pending, goal < 7.0  Uncontrolled  Other Stroke Risk Factors  Family hx stroke (Father)  Coronary artery disease   Other Active Problems  Compliance issues  Hypokalemia   Plan  Okay to discharge when workup is complete  Follow-up Dr. Leonie Man in 2 months  Social worker consult for financial issues.   Hospital day # 1  Shane Bussing PA-C [facility] Pager [phone] 08/26/2014, 8:09 AM I have personally examined this patient, reviewed notes, independently viewed imaging studies, participated in medical decision making and plan of care. I have made any additions or  clarifications directly to the above note. Agree with note above. He presented with a left hemiparesis and sensory loss likely due to suspected small right brain subcortical infarct not visualized on MRI. Etiology likely small vessel disease. He remains at risk for recurrent strokes, TIAs and needs ongoing stroke evaluation as well as aggressive risk factor modification. Start aspirin. Patient counseled to be compliant with his medications and to seek medical care and maintain healthy lifestyle.  Antony Contras, MD Medical Director [facility] Pager: [phone] 08/26/2014 11:26 AM     To contact Stroke Continuity provider, please refer to [email or web address] After hours, contact General Neurology

## 2014-08-26 NOTE — Care Management Note (Signed)
    Page 1 of 1   08/26/2014     9:52:23 AM CARE MANAGEMENT NOTE 08/26/2014  Patient:  Shane [name], Shane [name]   Account Number:  [id number]  Date Initiated:  08/26/2014  Documentation initiated by:  Lorne Skeens  Subjective/Objective Assessment:   Patient was admitted with hypertensive urgency.  Lives at home with spouse.     Action/Plan:   Will follow for discharge needs. Patient with recent job loss, uninsured.  Reports medication non-compliance due to cost.   Anticipated DC Date:  08/26/2014   Anticipated DC Plan:  HOME/SELF CARE  In-house referral  [place]  CM consult  [facility]  Medication Assistance      Choice offered to / List presented to:             Status of service:  Completed, signed off Medicare Important Message given?   (If response is "NO", the following Medicare IM given date fields will be blank) Date Medicare IM given:   Medicare IM given by:   Date Additional Medicare IM given:   Additional Medicare IM given by:    Discharge Disposition:    Per UR Regulation:  Reviewed for med. necessity/level of care/duration of stay  If discussed at [place] of Stay Meetings, dates discussed:    Comments:  08/26/14 [place] RN, MSN, CM- Met with patient to discuss discharge needs. Patient reports that he does not have a PCP.  He is interested in following up at the [facility]. Literature was provided. Patient is aware that all new patients will be seen on a walk-in basis and should arrive at the clinic by 0830 Monday through Friday.  Dr Dyann Kief to address medication cost by selecting medications from the $4 generic list, as approriate.  [place] with financial counseling was notified of patient's admission and will contact patient prior to his probable discharge later today.  Patient verbalized understanding and is in agreement with the plan of care.

## 2014-08-26 NOTE — Evaluation (Signed)
Physical Therapy Evaluation Patient Details Name: Shane [name] MRN: [id number] DOB: April 04, 1971 Today's Date: 08/26/2014   History of Present Illness  TIA with L side weakness, numbness and tingling  Clinical Impression  Pt was seen for evaluation of his balance and stability on the hallway and to assess the need for AD.  Stairs with one rail with min guard and cues for safety.  Pt is planning to go home with wife who will be available to him all the time.  Will continue therapy until pt goes home, as HHPT is not an option due to insurance.     Follow Up Recommendations Home health PT;Supervision - Intermittent    Equipment Recommendations  Cane    Recommendations for Other Services OT consult     Precautions / Restrictions Precautions Precautions: Fall Restrictions Weight Bearing Restrictions: No      Mobility  Bed Mobility               General bed mobility comments: up when PT entered  Transfers Overall transfer level: Needs assistance Equipment used: 1 person hand held assist Transfers: Sit to/from Stand;Stand Pivot Transfers Sit to Stand: Min guard Stand pivot transfers: Min guard       General transfer comment: pt guarded with sit - stand and during ambulation with slow pace  Ambulation/Gait Ambulation/Gait assistance: Min guard Ambulation Distance (Feet): 150 Feet Assistive device: 1 person hand held assist Gait Pattern/deviations: Step-through pattern;Decreased stride length;Wide base of support;Drifts right/left Gait velocity: reduced Gait velocity interpretation: Below normal speed for age/gender    Stairs Stairs: Yes Stairs assistance: Min guard Stair Management: One rail Right;Forwards Number of Stairs: 10 General stair comments: needs extra time and supervision to safely manage stairs  Wheelchair Mobility    Modified Rankin (Stroke Patients Only)       Balance Overall balance assessment: Needs assistance Sitting-balance support:  Feet supported Sitting balance-Leahy Scale: Good   Postural control: Posterior lean Standing balance support: Single extremity supported Standing balance-Leahy Scale: Fair Standing balance comment: fair- dynamic standing                             Pertinent Vitals/Pain Pain Assessment: No/denies pain Pain Score: 0-No pain Pain Location: chest Pain Descriptors / Indicators: Pressure Pain Intervention(s): Monitored during session    Home Living Family/patient expects to be discharged to:: Private residence Living Arrangements: Spouse/significant other Available Help at Discharge: Family;Available 24 hours/day Type of Home: House Home Access: Stairs to enter Entrance Stairs-Rails: Right Entrance Stairs-Number of Steps: 2 Home Layout: One level Home Equipment: None      Prior Function Level of Independence: Independent         Comments: Previously working full-time     Journalist, newspaper   Dominant Hand: Right    Extremity/Trunk Assessment   Upper Extremity Assessment: LUE deficits/detail       LUE Deficits / Details: weakness with use for handrails on stairs   Lower Extremity Assessment: Generalized weakness      Cervical / Trunk Assessment: Normal  Communication   Communication: No difficulties  Cognition Arousal/Alertness: Awake/alert Behavior During Therapy: WFL for tasks assessed/performed Overall Cognitive Status: Within Functional Limits for tasks assessed                      General Comments General comments (skin integrity, edema, etc.): Pt demonstrates minor safety issues with need for SPC to steady at home.  Talked  with pt about HHPT but he cannot qualify as he is uninsured    Exercises Other Exercises Other Exercises: Jules Schick was 4- to 4 hips, knees 4+ to 5,  and ankles 4+      Assessment/Plan    PT Assessment Patient needs continued PT services  PT Diagnosis Generalized weakness   PT Problem List Decreased  strength;Decreased range of motion;Decreased activity tolerance;Decreased balance;Decreased mobility;Decreased coordination;Decreased knowledge of use of DME  PT Treatment Interventions DME instruction;Gait training;Stair training;Functional mobility training;Therapeutic activities;Therapeutic exercise;Balance training;Neuromuscular re-education;Patient/family education   PT Goals (Current goals can be found in the Care Plan section) Acute Rehab PT Goals Patient Stated Goal: go home PT Goal Formulation: With patient Time For Goal Achievement: 09/02/14 Potential to Achieve Goals: Good    Frequency Min 3X/week   Barriers to discharge   Needs SPC to manage home transition    Co-evaluation               End of Session Equipment Utilized During Treatment: Gait belt Activity Tolerance: Patient tolerated treatment well;Patient limited by lethargy Patient left: in chair;with call bell/phone within reach;with chair alarm set Nurse Communication: Mobility status    Functional Assessment Tool Used: clinical judgment Functional Limitation: Mobility: Walking and moving around Mobility: Walking and Moving Around Current Status [phone]): At least 20 percent but less than 40 percent impaired, limited or restricted Mobility: Walking and Moving Around Goal Status [phone]): At least 1 percent but less than 20 percent impaired, limited or restricted    Time: 1112-1139 PT Time Calculation (min) (ACUTE ONLY): 27 min   Charges:   PT Evaluation $Initial PT Evaluation Tier I: 1 Procedure PT Treatments $Gait Training: 8-22 mins   PT G Codes:   PT G-Codes **NOT FOR INPATIENT CLASS** Functional Assessment Tool Used: clinical judgment Functional Limitation: Mobility: Walking and moving around Mobility: Walking and Moving Around Current Status (H1505): At least 20 percent but less than 40 percent impaired, limited or restricted Mobility: Walking and Moving Around Goal Status [phone]): At least 1  percent but less than 20 percent impaired, limited or restricted    Ramond Dial 08/26/2014, 12:49 PM  Mee Hives, PT MS Acute Rehab Dept. Number: [phone]

## 2014-08-26 NOTE — Evaluation (Signed)
Speech Language Pathology Evaluation Patient Details Name: Shane [name] MRN: [id number] DOB: 06-04-1971 Today's Date: 08/26/2014 Time: 4098-1191 SLP Time Calculation (min) (ACUTE ONLY): 22 min  Problem List:  Patient Active Problem List   Diagnosis Date Noted  . Hypertensive urgency 08/25/2014  . TIA (transient ischemic attack) 08/25/2014  . DM2 (diabetes mellitus, type 2) 08/25/2014  . Headache   . Left-sided weakness    Past Medical History:  Past Medical History  Diagnosis Date  . Hypertension   . Diabetes mellitus without complication   . Coronary artery disease    Past Surgical History:  Past Surgical History  Procedure Laterality Date  . Hernia repair     HPI:  Shane [name] is an 44 y.o. male with a past medical history significant for HTN, DM, hyperlipidemia, CAD, poor adherence to treatment, admitted to [facility] for further evaluation of left hemiparesis, left hemisensory impairment, and dysarthria.   Assessment / Plan / Recommendation Clinical Impression  Patient appears generally [name] for speech-language and cognitive functioning. He reported a 6 month history of having difficulty with concentrating and "remembering", but he attributes that to not taking his heart or diabetes medications in this 6 month period. Patient appears to have had a TIA, and although he exhibited intermittent hesitations when answering questions, he appeared to be intact cognitively. He did not exhibit any overt speech or language problems and was able to demonstrate comprehension and ability to fully express himself at conversational level.    SLP Assessment  Patient does not need any further Speech Lanaguage Pathology Services    Follow Up Recommendations  None    Frequency and Duration        Pertinent Vitals/Pain Pain Assessment: No/denies pain Pain Score: 0-No pain   SLP Goals     SLP Evaluation Prior Functioning  Cognitive/Linguistic Baseline: Baseline deficits Baseline deficit  details: patient stated he stopped taking his BP and diabetes meds 6 months ago secondary to losing his job/insurance. He stated that he has also noticed difficulty concentrating and remembering things. He himself said, "I think it's probably caused by not taking the medications". Type of Home: House  Lives With: Spouse Available Help at Discharge: Family;Available 24 hours/day Vocation: Unemployed   Cognition  Overall Cognitive Status: History of cognitive impairments - at baseline (6 month history of patient-reported difficulty concentrating/memory) Arousal/Alertness: Awake/alert Orientation Level: Oriented X4 Attention: Alternating Alternating Attention: Appears intact Memory: Appears intact Awareness: Appears intact Problem Solving: Appears intact Executive Function: Reasoning;Organizing Reasoning: Appears intact Organizing: Impaired Organizing Impairment: Verbal complex (mild hesitations at times when answering questions) Safety/Judgment: Appears intact    Comprehension  Auditory Comprehension Overall Auditory Comprehension: Appears within functional limits for tasks assessed    Expression Expression Primary Mode of Expression: Verbal Verbal Expression Overall Verbal Expression: Appears within functional limits for tasks assessed Written Expression Dominant Hand: Right   Oral / Motor Oral Motor/Sensory Function Overall Oral Motor/Sensory Function: Appears within functional limits for tasks assessed Motor Speech Overall Motor Speech: Appears within functional limits for tasks assessed   GO     Shane [name] 08/26/2014, 3:01 PM  Shane Monarch, MA, CCC-SLP

## 2014-08-26 NOTE — Evaluation (Signed)
Occupational Therapy Evaluation Patient Details Name: Shane [name] MRN: [id number] DOB: [birth year] Today's Date: 08/26/2014    History of Present Illness Sever HA, numbnes in L UE and L LE, chest pain   Clinical Impression   Pt is Mod I with ADLs and min guard A - sup with ADL mobility with decreased balance and L side weakness. Pt provided with L UE strengthening exercises and all education completed. Recommend PT consult for function mobility, balance and safety. No further acute OT services indicated at this time. OT will sign off    Follow Up Recommendations  No OT follow up    Equipment Recommendations  None recommended by OT    Recommendations for Other Services PT consult;Speech consult     Precautions / Restrictions Precautions Precautions: None Restrictions Weight Bearing Restrictions: No      Mobility Bed Mobility               General bed mobility comments: pt up in recliner  Transfers Overall transfer level: Needs assistance   Transfers: Sit to/from Stand Sit to Stand: Min guard         General transfer comment: pt guarded with sit - stand and during ambulation with slow pace    Balance Overall balance assessment: Needs assistance Sitting-balance support: No upper extremity supported;Feet supported Sitting balance-Leahy Scale: Good     Standing balance support: No upper extremity supported;During functional activity Standing balance-Leahy Scale: Fair                              ADL Overall ADL's : Modified independent                                       General ADL Comments: guarded, slow pace, holding onto wall to step into pants     Vision Wears glasses at al times Vision Assessment?: Vision impaired- to be further tested in functional context Additional Comments: blurry vison >10 -15 feet away              Pertinent Vitals/Pain Pain Assessment: 0-10 Pain Score: 6  Pain Location:  chest Pain Descriptors / Indicators: Pressure Pain Intervention(s): Monitored during session     Hand Dominance Right   Extremity/Trunk Assessment Upper Extremity Assessment Upper Extremity Assessment: Overall WFL for tasks assessed;Generalized weakness;LUE deficits/detail LUE Deficits / Details: weakness, grossly 3+/5   Lower Extremity Assessment Lower Extremity Assessment: Defer to PT evaluation   Cervical / Trunk Assessment Cervical / Trunk Assessment: Normal   Communication Communication: no difficulty during eval Communication:  (pt states that he has difficulty putting thoughts together and finding words at times even PTA)   Cognition Arousal/Alertness: Awake/alert Behavior During Therapy: WFL for tasks assessed/performed Overall Cognitive Status: Within Functional Limits for tasks assessed                     General Comments   pt very pleasant and cooperative    Exercises   Other Exercises Other Exercises: Pt provided with L UE strengthening exercises with  level 2 theraband and squeeze ball. Pt able to return demonstrate         Home Living Family/patient expects to be discharged to:: Private residence Living Arrangements: Spouse/significant other   Type of Home: House Home Access: Stairs to enter [employer] of Steps: 3 Entrance Stairs-Rails: Right  Home Layout: One level     Bathroom Shower/Tub: Teacher, early years/pre: Standard     Home Equipment: None          Prior Functioning/Environment Level of Independence: Independent             OT Diagnosis: Generalized weakness   OT Problem List: Decreased strength;Impaired balance (sitting and/or standing);Pain   OT Treatment/Interventions:      OT Goals(Current goals can be found in the care plan section) Acute Rehab OT Goals Patient Stated Goal: go home OT Goal Formulation: With patient  OT Frequency:  none   Barriers to D/C:  none                         End of Session    Activity Tolerance: Patient tolerated treatment well Patient left: in chair;with call bell/phone within reach;Other (comment) (with case mgr)   Time: 7121-9758 OT Time Calculation (min): 35 min Charges:  OT General Charges $OT Visit: 1 Procedure OT Evaluation $Initial OT Evaluation Tier I: 1 Procedure OT Treatments $Therapeutic Activity: 8-22 mins $Therapeutic Exercise: 8-22 mins G-Codes: OT G-codes **NOT FOR INPATIENT CLASS** Functional Limitation: Self care Self Care Current Status (I3254): At least 1 percent but less than 20 percent impaired, limited or restricted Self Care Goal Status (D8264): At least 1 percent but less than 20 percent impaired, limited or restricted Self Care Discharge Status [phone]): At least 1 percent but less than 20 percent impaired, limited or restricted  [name] 08/26/2014, 10:05 AM

## 2014-08-26 NOTE — Progress Notes (Signed)
VASCULAR LAB PRELIMINARY  PRELIMINARY  PRELIMINARY  PRELIMINARY  Carotid Duplex completed.    Preliminary report:  RT ICA/CCA ratio: 1.0                                 LT ICA/CCA ratio: 0.88                                 1-39% stenosis bilaterally.                                 Antegrade vertebral flow bilaterally.  August Albino, RVT 08/26/2014, 3:50 PM

## 2014-08-26 NOTE — Progress Notes (Addendum)
Inpatient Diabetes Program Recommendations  AACE/ADA: New Consensus Statement on Inpatient Glycemic Control (2013)  Target Ranges:  Prepandial:   less than 140 mg/dL      Peak postprandial:   less than 180 mg/dL (1-2 hours)      Critically ill patients:  140 - 180 mg/dL   Results for GEARALD, STONEBRAKER (MRN [id number]) as of 08/26/2014 07:44  Ref. Range 08/25/2014 17:31 08/25/2014 22:21 08/26/2014 06:46  Glucose-Capillary Latest Range: 70-99 mg/dL 210 (H) 163 (H) 234 (H)   Diabetes history: DM2 Outpatient Diabetes medications: According to H&P, out of DM medications for 7 months Current orders for Inpatient glycemic control: Lantus 10 units daily, Novolog 0-9 units TID with meals  Inpatient Diabetes Program Recommendations Insulin - Basal: Please consider increasing Lantus to 15 units daily. Correction (SSI): Please consider increasing Novolog correction to moderate scale and adding Novolog bedtime correction scale. HgbA1C: A1C in process.   Note 08/26/14@10 :50-Spoke with patient about diabetes and home regimen for diabetes control. Patient reports that he was taking Metformin 1000 mg BID as an outpatient for diabetes control up until about 7 months ago.  Case Management has talked with patient already and the plan will be for patient to follow up at the [facility] and [facility]  Inquired about knowledge about A1C and patient reports that he knows what an A1C is but does not recall his last A1C. Explained that an A1C has been ordered and results are pending at this time. Discussed how A1C relates to actual glucose values and discussed importance of maintaining good CBG control to prevent long-term and short-term complications. Patient reports that currently him nor his wife have a job and are experiencing financially difficulty.  Inquired about testing glucose and patient reports that he has used a glucometer in the past but currently does not have a glucometer to check his  glucose. Informed patient that he can take the prescription for the glucometer and testing supplies to the [facility] and they will provide him with a glucometer and testing supplies since he will following up there. Discussed in detail how uncontrolled diabetes leads to complications due to damage inside blood vessels which increases risk of complications. Inquired about whether patient has ever taken insulin and he reports that he has been on insulin in the past and was changed to oral medications for DM control. Patient is agreeable to taking insulin as an outpatient if necessary but he will need medication assistance in getting insulin.  Patient verbalized understanding of information discussed and he states that he has no further questions at this time related to diabetes.   At time of discharge, please be sure patient receives a prescription for a glucometer and testing supplies.  Thanks, Barnie Alderman, RN, MSN, CCRN, CDE Diabetes Coordinator Inpatient Diabetes Program [phone] (Team Pager) [phone] ([facility] office) [phone] [facility] office)

## 2014-08-26 NOTE — Progress Notes (Signed)
Nutrition Brief Note  Patient identified on the Malnutrition Screening Tool (MST) Report  Wt Readings from Last 15 Encounters:  08/26/14 171 lb 11.8 oz (77.9 kg)   Shane [name] is a 44 y.o. male, with history of hypertension and type 2 diabetes mellitus for which she's been out of medications for the last 7 months, he denies any knowledge of CAD although it's listed, he comes in with 18 hour history of mild generalized headache, left-sided chest pressure which is constant, nonradiating, no shortness of breath, no relation with exertion, he started experiencing some left-sided tingling and numbness last night, this morning he woke up and he had some weakness in the left leg as well, came to the ER where his blood pressure was over 200/110, head CT unremarkable, EKG nonacute, blood work stable I was called to admit the patient for hypertensive urgency/crisis.  Pt reports very good appetite presently and PTA. He denies any changes in intake or eating habits. He complains of hunger. He is requesting Glucerna Shakes between meals- RD to order.  He reports an approximate 75# wt loss over the past year, of unknown etiology. He reveals he has dropped from a XL size to a M size. He reports that he is very active at baseline, however, no changes in activity level. He reveals that his diabetes is very poorly controlled and he suspects this is partly the reason for his weight loss. Chart review reveals that pt is uninsured and has been without medications for 7 months.   Nutrition-focused physical exam reveals no signs of fat or muscle depletion.   He denies further nutrition education needs and is very grateful for RD visit.   BMI of 26.8 meets criteria for overweight.   Current diet order is carb modified, patient is consuming approximately 100% of meals at this time. Labs and medications reviewed.   No nutrition interventions warranted at this time. If nutrition issues arise, please consult RD.    Katira Dumais A. Jimmye Norman, RD, LDN, CDE Pager: [phone] After hours Pager: [phone]

## 2014-08-27 LAB — HEMOGLOBIN A1C
HEMOGLOBIN A1C: 11.9 % — AB (ref 4.8–5.6)
Mean Plasma Glucose: 295 mg/dL

## 2014-08-29 ENCOUNTER — Inpatient Hospital Stay: Payer: Self-pay

## 2014-08-29 DIAGNOSIS — G451 Carotid artery syndrome (hemispheric): Secondary | ICD-10-CM | POA: Insufficient documentation

## 2014-08-29 NOTE — Progress Notes (Signed)
UR complete.  Jamae Tison RN, MSN 

## 2014-08-31 ENCOUNTER — Ambulatory Visit: Payer: Self-pay | Attending: Internal Medicine | Admitting: Family Medicine

## 2014-08-31 VITALS — BP 123/84 | HR 92 | Temp 97.5°F | Resp 16 | Ht 67.0 in | Wt 162.0 lb

## 2014-08-31 DIAGNOSIS — E119 Type 2 diabetes mellitus without complications: Secondary | ICD-10-CM

## 2014-08-31 DIAGNOSIS — Z8673 Personal history of transient ischemic attack (TIA), and cerebral infarction without residual deficits: Secondary | ICD-10-CM | POA: Insufficient documentation

## 2014-08-31 DIAGNOSIS — I159 Secondary hypertension, unspecified: Secondary | ICD-10-CM

## 2014-08-31 DIAGNOSIS — I10 Essential (primary) hypertension: Secondary | ICD-10-CM | POA: Insufficient documentation

## 2014-08-31 DIAGNOSIS — Z7982 Long term (current) use of aspirin: Secondary | ICD-10-CM | POA: Insufficient documentation

## 2014-08-31 DIAGNOSIS — E1165 Type 2 diabetes mellitus with hyperglycemia: Secondary | ICD-10-CM | POA: Insufficient documentation

## 2014-08-31 DIAGNOSIS — Z79899 Other long term (current) drug therapy: Secondary | ICD-10-CM | POA: Insufficient documentation

## 2014-08-31 LAB — GLUCOSE, POCT (MANUAL RESULT ENTRY): POC GLUCOSE: 275 mg/dL — AB (ref 70–99)

## 2014-08-31 MED ORDER — METFORMIN HCL 1000 MG PO TABS: 1000.0000 mg | ORAL_TABLET | Freq: Two times a day (BID) | ORAL | Status: DC

## 2014-08-31 NOTE — Progress Notes (Signed)
Pt is here today to establish care. Pt states that he has tightness of his chest and he feels weak w/ blurred vision.  Pt has a history of HTN and diabetes.

## 2014-08-31 NOTE — Patient Instructions (Signed)
Increase metformin to 2 twice a day. Continue other medications as prescribed at hospital

## 2014-08-31 NOTE — Progress Notes (Signed)
wChief Complaint: Here to establish care and follow-up hypertension and diabetes. Subjective:  Patient presents for a hospital for hospital follow-up. He was admitted with a hypertensive urgency on March 10 and was discharged on March 11.  She has a history of hypertension and diabetes but has not been on medication in about 7 months.  He was also diagnosed with aTIA.   Hospital Note:  History of present illness:  44 y.o. male, with history of hypertension and type 2 diabetes mellitus for which she's been out of medications for the last 7 months, he denies any knowledge of CAD although it's listed, he comes in with 18 hour history of mild generalized headache, left-sided chest pressure which is constant, nonradiating, no shortness of breath, no relation with exertion, he started experiencing some left-sided tingling and numbness last night, this morning he woke up and he had some weakness in the left leg as well, came to the ER where his blood pressure was over 200/110, head CT unremarkable, EKG nonacute, blood work stable I was called to admit the patient for hypertensive urgency/crisis.   Hospital Course:  1-TIA: with left sided weakness and numbness; most likely due to small vessel disease due to uncontrolled HTN -risk factors modifications (HTN, HLD, DM) -ASA for secondary prevention -patient to follow with neurology in 2 months as an outpatient for further evaluation and rec's  2-hypertensive urgency/accelerated HTN: controlled with medications -started at discharge on 4 dollar menu drugs -patient advise to take medications as prescribed and to follow heart healthy diet -discharge regimen includes HCTZ 12.5 mg, lisinopril 76m and coreg 12.5 mg BID  3-diabetes mellitus type 2: patient started on metformin BID -[facility] -he has not been taking any meds for > 7 months -advise to follow low carb diet       ROS:   GEN:   Admits to feeling chilled and sweaty last night Skin:   Denies lesions or rashes HENT:   Denies  earache, epistaxis, sore throat, or neck pain,                           Admits: headaches                LUNGS:  Denies SOB with rest     Admits to mild SOB with exertion CV:   Denies CP or palpitations. Has some musculoskeletal discomfor in rib cage ABD:   Denies abdominal pain,    Admits: nausea but no vomiting            EXT:    Denies muscle spasms or swelling; no pain in lower ext, no weakness NEURO:   Denies numbness or tingling, denies sz, Hx of stroke.   Admits to some tingling of leg.  Objective:  Filed Vitals:   08/31/14 1005  BP: 123/84  Pulse: 92  Temp: 97.5 F (36.4 C)  TempSrc: Oral  Resp: 16  Height: 5' 7"  (1.702 m)  Weight: 162 lb (73.483 kg)  SpO2: 100%    Physical Exam:  General:  in no acute distress. HEENT:  no pallor, no icterus, moist oral mucosa, no  lymphadenopathy Heart:   Normal  s1 &s2  Regular rate and rhythm, without M,G,R Lungs:   Clear to auscultation bilaterally. Abdomen:  Soft, nontender, nondistended, positive bowel sounds. Exetremeties:  No pedal edema,pedal pulses normal. Neuro:   Alert, awake, oriented x3, nonfocal.  Pertinent  Lab Results:   Medications: Prior to Admission medications   Medication Sig Start Date End Date Taking? Authorizing Provider  aspirin 81 MG chewable tablet Chew 1 tablet (81 mg total) by mouth daily. 08/26/14  Yes Barton Dubois, MD  carvedilol (COREG) 12.5 MG tablet Take 1 tablet (12.5 mg total) by mouth 2 (two) times daily with a meal. 08/26/14  Yes Barton Dubois, MD  hydrochlorothiazide (MICROZIDE) 12.5 MG capsule Take 1 capsule (12.5 mg total) by mouth daily. 08/26/14  Yes Barton Dubois, MD  lisinopril (PRINIVIL,ZESTRIL) 20 MG tablet Take 2 tablets (40 mg total) by mouth daily. 08/26/14  Yes Barton Dubois, MD  pravastatin (PRAVACHOL) 40 MG tablet Take 1 tablet (40 mg total) by mouth daily. 08/26/14  Yes  Barton Dubois, MD  blood glucose meter kit and supplies KIT Dispense based on patient and insurance preference. Use up to four times daily as directed. (FOR ICD-9 250.00, 250.01). Patient not taking: Reported on 08/31/2014 08/26/14   Barton Dubois, MD  metFORMIN (GLUCOPHAGE) 1000 MG tablet Take 1 tablet (1,000 mg total) by mouth 2 (two) times daily with a meal. 08/31/14   Micheline Chapman, NP    Assessment: 1. Hypertension 2. Diabetes, type II, uncontrolled 3. Hx of recent TIA Plan: Continue treatment started at hospital for hypertension. Will increase metformin to 1000 mg BID (blood sugar 275 today).  He was given 20 units of Novolog.  Follow up:  The patient was given clear instructions to go to ER or return to medical center if symptoms don't improve, worsen or new problems develop. The patient verbalized understanding. The patient was told to call to get lab results if they haven't heard anything in the next week.   This note has been created with Surveyor, quantity. Any transcriptional errors are unintentional.   Micheline Chapman, FNP,BC 08/31/2014, 11:06 AM

## 2014-09-14 ENCOUNTER — Ambulatory Visit: Payer: Self-pay | Attending: Internal Medicine | Admitting: Internal Medicine

## 2014-09-14 ENCOUNTER — Encounter: Payer: Self-pay | Admitting: Internal Medicine

## 2014-09-14 VITALS — BP 142/96 | HR 105 | Temp 98.1°F | Resp 16 | Wt 161.8 lb

## 2014-09-14 DIAGNOSIS — I10 Essential (primary) hypertension: Secondary | ICD-10-CM | POA: Insufficient documentation

## 2014-09-14 DIAGNOSIS — Z23 Encounter for immunization: Secondary | ICD-10-CM | POA: Insufficient documentation

## 2014-09-14 DIAGNOSIS — E139 Other specified diabetes mellitus without complications: Secondary | ICD-10-CM

## 2014-09-14 DIAGNOSIS — I2510 Atherosclerotic heart disease of native coronary artery without angina pectoris: Secondary | ICD-10-CM | POA: Insufficient documentation

## 2014-09-14 DIAGNOSIS — Z7982 Long term (current) use of aspirin: Secondary | ICD-10-CM | POA: Insufficient documentation

## 2014-09-14 DIAGNOSIS — E119 Type 2 diabetes mellitus without complications: Secondary | ICD-10-CM | POA: Insufficient documentation

## 2014-09-14 DIAGNOSIS — Z8673 Personal history of transient ischemic attack (TIA), and cerebral infarction without residual deficits: Secondary | ICD-10-CM | POA: Insufficient documentation

## 2014-09-14 LAB — COMPLETE METABOLIC PANEL WITH GFR
ALBUMIN: 4.4 g/dL (ref 3.5–5.2)
ALK PHOS: 51 U/L (ref 39–117)
ALT: 14 U/L (ref 0–53)
AST: 16 U/L (ref 0–37)
BILIRUBIN TOTAL: 0.3 mg/dL (ref 0.2–1.2)
BUN: 15 mg/dL (ref 6–23)
CO2: 30 mEq/L (ref 19–32)
Calcium: 10.1 mg/dL (ref 8.4–10.5)
Chloride: 102 mEq/L (ref 96–112)
Creat: 0.89 mg/dL (ref 0.50–1.35)
GFR, Est African American: >89 mL/min
GFR, Est Non African American: >89 mL/min
GLUCOSE: 259 mg/dL — AB (ref 70–99)
POTASSIUM: 4.5 meq/L (ref 3.5–5.3)
Sodium: 141 mEq/L (ref 135–145)
Total Protein: 7.2 g/dL (ref 6.0–8.3)

## 2014-09-14 LAB — GLUCOSE, POCT (MANUAL RESULT ENTRY): POC Glucose: 208 mg/dl — AB (ref 70–99)

## 2014-09-14 MED ORDER — CARVEDILOL 12.5 MG PO TABS: 12.5000 mg | ORAL_TABLET | Freq: Two times a day (BID) | ORAL | Status: DC

## 2014-09-14 MED ORDER — METFORMIN HCL 1000 MG PO TABS: 1000.0000 mg | ORAL_TABLET | Freq: Two times a day (BID) | ORAL | Status: DC

## 2014-09-14 MED ORDER — INSULIN GLARGINE 100 UNIT/ML SOLOSTAR PEN: 10.0000 [IU] | PEN_INJECTOR | Freq: Every day | SUBCUTANEOUS | Status: DC

## 2014-09-14 NOTE — Progress Notes (Deleted)
Patient Demographics  Shane [name], is a 44 y.o. male  ZOX:[id number]  [id number]  DOB - October 13, 1970  CC:  Chief Complaint  Patient presents with  . Establish Care       HPI: Shane [name] is a 44 y.o. male here today to establish medical care. Patient has No headache, No chest pain, No abdominal pain - No Nausea, No new weakness tingling or numbness, No Cough - SOB.  No Known Allergies Past Medical History  Diagnosis Date  . Hypertension   . Diabetes mellitus without complication   . Coronary artery disease    Current Outpatient Prescriptions on File Prior to Visit  Medication Sig Dispense Refill  . hydrochlorothiazide (MICROZIDE) 12.5 MG capsule Take 1 capsule (12.5 mg total) by mouth daily. 30 capsule 1  . lisinopril (PRINIVIL,ZESTRIL) 20 MG tablet Take 2 tablets (40 mg total) by mouth daily. 60 tablet 1  . pravastatin (PRAVACHOL) 40 MG tablet Take 1 tablet (40 mg total) by mouth daily. 30 tablet 1  . aspirin 81 MG chewable tablet Chew 1 tablet (81 mg total) by mouth daily.    . blood glucose meter kit and supplies KIT Dispense based on patient and insurance preference. Use up to four times daily as directed. (FOR ICD-9 250.00, 250.01). (Patient not taking: Reported on 08/31/2014) 1 each 0   No current facility-administered medications on file prior to visit.   Family History  Problem Relation Age of Onset  . Hypertension Mother   . Diabetes Mother   . Hyperlipidemia Mother   . Heart disease Mother   . Diabetes Father   . Hypertension Father   . Stroke Father   . Heart disease Father    History   Social History  . Marital Status: Unknown    Spouse Name: N/A  . Number of Children: N/A  . Years of Education: N/A   Occupational History  . Not on file.   Social History Main Topics  . Smoking status: Never Smoker   . Smokeless tobacco: Never Used  . Alcohol Use: No  . Drug Use: No  . Sexual Activity: Not on file   Other Topics Concern  . Not on  file   Social History Narrative    Review of Systems: Constitutional: Negative for fever, chills, diaphoresis, activity change, appetite change and fatigue. HENT: Negative for ear pain, nosebleeds, congestion, facial swelling, rhinorrhea, neck pain, neck stiffness and ear discharge.  Eyes: Negative for pain, discharge, redness, itching and visual disturbance. Respiratory: Negative for cough, choking, chest tightness, shortness of breath, wheezing and stridor.  Cardiovascular: Negative for chest pain, palpitations and leg swelling. Gastrointestinal: Negative for abdominal distention. Genitourinary: Negative for dysuria, urgency, frequency, hematuria, flank pain, decreased urine volume, difficulty urinating and dyspareunia.  Musculoskeletal: Negative for back pain, joint swelling, arthralgia and gait problem. Neurological: Negative for dizziness, tremors, seizures, syncope, facial asymmetry, speech difficulty, weakness, light-headedness, numbness and headaches.  Hematological: Negative for adenopathy. Does not bruise/bleed easily. Psychiatric/Behavioral: Negative for hallucinations, behavioral problems, confusion, dysphoric mood, decreased concentration and agitation.    Objective:   Filed Vitals:   09/14/14 0908  BP: 142/96  Pulse: 105  Temp: 98.1 F (36.7 C)  Resp: 16    Physical Exam: Constitutional: Patient appears well-developed and well-nourished. No distress. HENT: Normocephalic, atraumatic, External right and left ear normal. Oropharynx is clear and moist.  Eyes: Conjunctivae and EOM are normal. PERRLA, no scleral icterus. Neck: Normal ROM. Neck supple. No JVD. No tracheal deviation.  No thyromegaly. CVS: RRR, S1/S2 +, no murmurs, no gallops, no carotid bruit.  Pulmonary: Effort and breath sounds normal, no stridor, rhonchi, wheezes, rales.  Abdominal: Soft. BS +, no distension, tenderness, rebound or guarding.  Musculoskeletal: Normal range of motion. No edema and no  tenderness.  Neuro: Alert. Normal reflexes, muscle tone coordination. No cranial nerve deficit. Skin: Skin is warm and dry. No rash noted. Not diaphoretic. No erythema. No pallor. Psychiatric: Normal mood and affect. Behavior, judgment, thought content normal.  Lab Results  Component Value Date   WBC 6.9 08/26/2014   HGB 13.7 08/26/2014   HCT 40.8 08/26/2014   MCV 87.9 08/26/2014   PLT 219 08/26/2014   Lab Results  Component Value Date   CREATININE 0.69 08/26/2014   BUN 7 08/26/2014   NA 140 08/26/2014   K 3.3* 08/26/2014   CL 105 08/26/2014   CO2 26 08/26/2014    Lab Results  Component Value Date   HGBA1C 11.9* 08/26/2014   Lipid Panel     Component Value Date/Time   CHOL 203* 08/26/2014 0505   TRIG 204* 08/26/2014 0505   HDL 39* 08/26/2014 0505   CHOLHDL 5.2 08/26/2014 0505   VLDL 41* 08/26/2014 0505   LDLCALC 123* 08/26/2014 0505       Assessment and plan:   1. Other specified diabetes mellitus without complications *** - Glucose (CBG)  2. Essential hypertension ***  3. Need for prophylactic vaccination against Streptococcus pneumoniae (pneumococcus) ***        Health Maintenance   -Vaccinations:  Pneumovax today   Return in about 3 months (around 12/15/2014) for diabetes, hypertension, hyperipidemia, BP and CBG check in 2 weeks/Nurse Visit.    The patient was given clear instructions to go to ER or return to medical center if symptoms don't improve, worsen or new problems develop. The patient verbalized understanding. The patient was told to call to get lab results if they haven't heard anything in the next week.    This note has been created with Surveyor, quantity. Any transcriptional errors are unintentional.   Lorayne Marek, MD

## 2014-09-14 NOTE — Progress Notes (Signed)
Patient here to establish care Currently taking medication for diabetes Hypertension and cholesterol

## 2014-09-14 NOTE — Progress Notes (Signed)
Patient Demographics  Shane [name], is a 44 y.o. male  [id number]  [id number]  DOB - 1971-01-15  CC:  Chief Complaint  Patient presents with  . Establish Care       HPI: Shane [name] is a 44 y.o. male here today to establish medical care.Patient has history of hypertension, type 2 diabetes, recently hospitalized with symptoms of generalized headache left-sided chest pain, left-sided weakness numbness EMR reviewed her his blood pressure was elevated acute findings, patient was diagnosed with TIA was slightly secondary to uncontrolled hypertension, he was started on aspirin, his medications were optimized and was discharged on hydrochlorothiazide, lisinopril, Coreg, Pravachol, he was also found to have uncontrolled diabetes and was started on metformin and recently it was increased to thousand milligram twice a day, as per patient he then when he was taking this medication in the past his A1c was not well controlled. His blood pressure is borderline elevated as per patient he has not received the Coreg medication and needs the prescription. Patient has No headache, No chest pain, No abdominal pain - No Nausea, No new weakness tingling or numbness, No Cough - SOB.  No Known Allergies Past Medical History  Diagnosis Date  . Hypertension   . Diabetes mellitus without complication   . Coronary artery disease    Current Outpatient Prescriptions on File Prior to Visit  Medication Sig Dispense Refill  . hydrochlorothiazide (MICROZIDE) 12.5 MG capsule Take 1 capsule (12.5 mg total) by mouth daily. 30 capsule 1  . lisinopril (PRINIVIL,ZESTRIL) 20 MG tablet Take 2 tablets (40 mg total) by mouth daily. 60 tablet 1  . pravastatin (PRAVACHOL) 40 MG tablet Take 1 tablet (40 mg total) by mouth daily. 30 tablet 1  . aspirin 81 MG chewable tablet Chew 1 tablet (81 mg total) by mouth daily.    . blood glucose meter kit and supplies KIT Dispense based on patient and insurance preference.  Use up to four times daily as directed. (FOR ICD-9 250.00, 250.01). (Patient not taking: Reported on 08/31/2014) 1 each 0   No current facility-administered medications on file prior to visit.   Family History  Problem Relation Age of Onset  . Hypertension Mother   . Diabetes Mother   . Hyperlipidemia Mother   . Heart disease Mother   . Diabetes Father   . Hypertension Father   . Stroke Father   . Heart disease Father    History   Social History  . Marital Status: Unknown    Spouse Name: N/A  . Number of Children: N/A  . Years of Education: N/A   Occupational History  . Not on file.   Social History Main Topics  . Smoking status: Never Smoker   . Smokeless tobacco: Never Used  . Alcohol Use: No  . Drug Use: No  . Sexual Activity: Not on file   Other Topics Concern  . Not on file   Social History Narrative    Review of Systems: Constitutional: Negative for fever, chills, diaphoresis, activity change, appetite change and fatigue. HENT: Negative for ear pain, nosebleeds, congestion, facial swelling, rhinorrhea, neck pain, neck stiffness and ear discharge.  Eyes: Negative for pain, discharge, redness, itching and visual disturbance. Respiratory: Negative for cough, choking, chest tightness, shortness of breath, wheezing and stridor.  Cardiovascular: Negative for chest pain, palpitations and leg swelling. Gastrointestinal: Negative for abdominal distention. Genitourinary: Negative for dysuria, urgency, frequency, hematuria, flank pain, decreased urine volume, difficulty urinating and dyspareunia.  Musculoskeletal: Negative  for back pain, joint swelling, arthralgia and gait problem. Neurological: Negative for dizziness, tremors, seizures, syncope, facial asymmetry, speech difficulty, weakness, light-headedness, numbness and headaches.  Hematological: Negative for adenopathy. Does not bruise/bleed easily. Psychiatric/Behavioral: Negative for hallucinations, behavioral  problems, confusion, dysphoric mood, decreased concentration and agitation.    Objective:   Filed Vitals:   09/14/14 0908  BP: 142/96  Pulse: 105  Temp: 98.1 F (36.7 C)  Resp: 16    Physical Exam: Constitutional: Patient appears well-developed and well-nourished. No distress. HENT: Normocephalic, atraumatic, External right and left ear normal. Oropharynx is clear and moist.  Eyes: Conjunctivae and EOM are normal. PERRLA, no scleral icterus. Neck: Normal ROM. Neck supple. No JVD. No tracheal deviation. No thyromegaly. CVS: RRR, S1/S2 +, no murmurs, no gallops, no carotid bruit.  Pulmonary: Effort and breath sounds normal, no stridor, rhonchi, wheezes, rales.  Abdominal: Soft. BS +, no distension, tenderness, rebound or guarding.  Musculoskeletal: Normal range of motion. No edema and no tenderness.  Neuro: Alert. Normal reflexes, muscle tone coordination. No cranial nerve deficit. Skin: Skin is warm and dry. No rash noted. Not diaphoretic. No erythema. No pallor. Psychiatric: Normal mood and affect. Behavior, judgment, thought content normal.  Lab Results  Component Value Date   WBC 6.9 08/26/2014   HGB 13.7 08/26/2014   HCT 40.8 08/26/2014   MCV 87.9 08/26/2014   PLT 219 08/26/2014   Lab Results  Component Value Date   CREATININE 0.69 08/26/2014   BUN 7 08/26/2014   NA 140 08/26/2014   K 3.3* 08/26/2014   CL 105 08/26/2014   CO2 26 08/26/2014    Lab Results  Component Value Date   HGBA1C 11.9* 08/26/2014   Lipid Panel     Component Value Date/Time   CHOL 203* 08/26/2014 0505   TRIG 204* 08/26/2014 0505   HDL 39* 08/26/2014 0505   CHOLHDL 5.2 08/26/2014 0505   VLDL 41* 08/26/2014 0505   LDLCALC 123* 08/26/2014 0505       Assessment and plan:   1. Other specified diabetes mellitus without complications Results for orders placed or performed in visit on 09/14/14  Glucose (CBG)  Result Value Ref Range   POC Glucose 208 (A) 70 - 99 mg/dl    Started  patient on lantus 10 units qhs , he will continue with metformin 1000 mg bid keep the fingerstick log he'll come back in 2 weeks for nurse visit  2. Essential hypertension Advised patient for DASH diet,continue with her lisinopril, hydrochlorothiazide, he is given refill on Coreg. - COMPLETE METABOLIC PANEL WITH GFR - Vit D  25 hydroxy (rtn osteoporosis monitoring)  3. Need for prophylactic vaccination against Streptococcus pneumoniae (pneumococcus) Pneumovax given today.  4. History of TIA (transient ischemic attack) Continue with ASA and statins  follow with neurology      Health Maintenance  -Vaccinations:  Pneumovax today   Return in about 3 months (around 12/15/2014) for diabetes, hypertension, hyperipidemia, BP and CBG check in 2 weeks/Nurse Visit.    The patient was given clear instructions to go to ER or return to medical center if symptoms don't improve, worsen or new problems develop. The patient verbalized understanding. The patient was told to call to get lab results if they haven't heard anything in the next week.    This note has been created with Surveyor, quantity. Any transcriptional errors are unintentional.   Lorayne Marek, MD

## 2014-09-14 NOTE — Patient Instructions (Signed)
Diabetes Mellitus and Food It is important for you to manage your blood sugar (glucose) level. Your blood glucose level can be greatly affected by what you eat. Eating healthier foods in the appropriate amounts throughout the day at about the same time each day will help you control your blood glucose level. It can also help slow or prevent worsening of your diabetes mellitus. Healthy eating may even help you improve the level of your blood pressure and reach or maintain a healthy weight.  HOW CAN FOOD AFFECT ME? Carbohydrates Carbohydrates affect your blood glucose level more than any other type of food. Your dietitian will help you determine how many carbohydrates to eat at each meal and teach you how to count carbohydrates. Counting carbohydrates is important to keep your blood glucose at a healthy level, especially if you are using insulin or taking certain medicines for diabetes mellitus. Alcohol Alcohol can cause sudden decreases in blood glucose (hypoglycemia), especially if you use insulin or take certain medicines for diabetes mellitus. Hypoglycemia can be a life-threatening condition. Symptoms of hypoglycemia (sleepiness, dizziness, and disorientation) are similar to symptoms of having too much alcohol.  If your health care provider has given you approval to drink alcohol, do so in moderation and use the following guidelines:  Women should not have more than one drink per day, and men should not have more than two drinks per day. One drink is equal to:  12 oz of beer.  5 oz of wine.  1 oz of hard liquor.  Do not drink on an empty stomach.  Keep yourself hydrated. Have water, diet soda, or unsweetened iced tea.  Regular soda, juice, and other mixers might contain a lot of carbohydrates and should be counted. WHAT FOODS ARE NOT RECOMMENDED? As you make food choices, it is important to remember that all foods are not the same. Some foods have fewer nutrients per serving than other  foods, even though they might have the same number of calories or carbohydrates. It is difficult to get your body what it needs when you eat foods with fewer nutrients. Examples of foods that you should avoid that are high in calories and carbohydrates but low in nutrients include:  Trans fats (most processed foods list trans fats on the Nutrition Facts label).  Regular soda.  Juice.  Candy.  Sweets, such as cake, pie, doughnuts, and cookies.  Fried foods. WHAT FOODS CAN I EAT? Have nutrient-rich foods, which will nourish your body and keep you healthy. The food you should eat also will depend on several factors, including:  The calories you need.  The medicines you take.  Your weight.  Your blood glucose level.  Your blood pressure level.  Your cholesterol level. You also should eat a variety of foods, including:  Protein, such as meat, poultry, fish, tofu, nuts, and seeds (lean animal proteins are best).  Fruits.  Vegetables.  Dairy products, such as milk, cheese, and yogurt (low fat is best).  Breads, grains, pasta, cereal, rice, and beans.  Fats such as olive oil, trans fat-free margarine, canola oil, avocado, and olives. DOES EVERYONE WITH DIABETES MELLITUS HAVE THE SAME MEAL PLAN? Because every person with diabetes mellitus is different, there is not one meal plan that works for everyone. It is very important that you meet with a dietitian who will help you create a meal plan that is just right for you. Document Released: 02/28/2005 Document Revised: 06/08/2013 Document Reviewed: 04/30/2013 Exit[employer] Patient Information 2015 [employer]. This   information is not intended to replace advice given to you by your health care provider. Make sure you discuss any questions you have with your health care provider. DASH Eating Plan DASH stands for "Dietary Approaches to Stop Hypertension." The DASH eating plan is a healthy eating plan that has been shown to reduce high  blood pressure (hypertension). Additional health benefits may include reducing the risk of type 2 diabetes mellitus, heart disease, and stroke. The DASH eating plan may also help with weight loss. WHAT DO I NEED TO KNOW ABOUT THE DASH EATING PLAN? For the DASH eating plan, you will follow these general guidelines:  Choose foods with a percent daily value for sodium of less than 5% (as listed on the food label).  Use salt-free seasonings or herbs instead of table salt or sea salt.  Check with your health care provider or pharmacist before using salt substitutes.  Eat lower-sodium products, often labeled as "lower sodium" or "no salt added."  Eat fresh foods.  Eat more vegetables, fruits, and low-fat dairy products.  Choose whole grains. Look for the word "whole" as the first word in the ingredient list.  Choose fish and skinless chicken or turkey more often than red meat. Limit fish, poultry, and meat to 6 oz (170 g) each day.  Limit sweets, desserts, sugars, and sugary drinks.  Choose heart-healthy fats.  Limit cheese to 1 oz (28 g) per day.  Eat more home-cooked food and less restaurant, buffet, and fast food.  Limit fried foods.  Cook foods using methods other than frying.  Limit canned vegetables. If you do use them, rinse them well to decrease the sodium.  When eating at a restaurant, ask that your food be prepared with less salt, or no salt if possible. WHAT FOODS CAN I EAT? Seek help from a dietitian for individual calorie needs. Grains Whole grain or whole wheat bread. Brown rice. Whole grain or whole wheat pasta. Quinoa, bulgur, and whole grain cereals. Low-sodium cereals. Corn or whole wheat flour tortillas. Whole grain cornbread. Whole grain crackers. Low-sodium crackers. Vegetables Fresh or frozen vegetables (raw, steamed, roasted, or grilled). Low-sodium or reduced-sodium tomato and vegetable juices. Low-sodium or reduced-sodium tomato sauce and paste. Low-sodium  or reduced-sodium canned vegetables.  Fruits All fresh, canned (in natural juice), or frozen fruits. Meat and Other Protein Products Ground beef (85% or leaner), grass-fed beef, or beef trimmed of fat. Skinless chicken or turkey. Ground chicken or turkey. Pork trimmed of fat. All fish and seafood. Eggs. Dried beans, peas, or lentils. Unsalted nuts and seeds. Unsalted canned beans. Dairy Low-fat dairy products, such as skim or 1% milk, 2% or reduced-fat cheeses, low-fat ricotta or cottage cheese, or plain low-fat yogurt. Low-sodium or reduced-sodium cheeses. Fats and Oils Tub margarines without trans fats. Light or reduced-fat mayonnaise and salad dressings (reduced sodium). Avocado. Safflower, olive, or canola oils. Natural peanut or almond butter. Other Unsalted popcorn and pretzels. The items listed above may not be a complete list of recommended foods or beverages. Contact your dietitian for more options. WHAT FOODS ARE NOT RECOMMENDED? Grains White bread. White pasta. White rice. Refined cornbread. Bagels and croissants. Crackers that contain trans fat. Vegetables Creamed or fried vegetables. Vegetables in a cheese sauce. Regular canned vegetables. Regular canned tomato sauce and paste. Regular tomato and vegetable juices. Fruits Dried fruits. Canned fruit in light or heavy syrup. Fruit juice. Meat and Other Protein Products Fatty cuts of meat. Ribs, chicken wings, bacon, sausage, bologna, salami, chitterlings, fatback, hot   dogs, bratwurst, and packaged luncheon meats. Salted nuts and seeds. Canned beans with salt. Dairy Whole or 2% milk, cream, half-and-half, and cream cheese. Whole-fat or sweetened yogurt. Full-fat cheeses or blue cheese. Nondairy creamers and whipped toppings. Processed cheese, cheese spreads, or cheese curds. Condiments Onion and garlic salt, seasoned salt, table salt, and sea salt. Canned and packaged gravies. Worcestershire sauce. Tartar sauce. Barbecue sauce.  Teriyaki sauce. Soy sauce, including reduced sodium. Steak sauce. Fish sauce. Oyster sauce. Cocktail sauce. Horseradish. Ketchup and mustard. Meat flavorings and tenderizers. Bouillon cubes. Hot sauce. Tabasco sauce. Marinades. Taco seasonings. Relishes. Fats and Oils Butter, stick margarine, lard, shortening, ghee, and bacon fat. Coconut, palm kernel, or palm oils. Regular salad dressings. Other Pickles and olives. Salted popcorn and pretzels. The items listed above may not be a complete list of foods and beverages to avoid. Contact your dietitian for more information. WHERE CAN I FIND MORE INFORMATION? [employer], [employer]: www.nhlbi.nih.gov/health/health-topics/topics/dash/ Document Released: 05/23/2011 Document Revised: 10/18/2013 Document Reviewed: 04/07/2013 Exit[employer] Patient Information 2015 [employer]. This information is not intended to replace advice given to you by your health care provider. Make sure you discuss any questions you have with your health care provider.  

## 2014-09-15 LAB — VITAMIN D 25 HYDROXY (VIT D DEFICIENCY, FRACTURES): Vit D, 25-Hydroxy: 20 ng/mL — ABNORMAL LOW (ref 30–100)

## 2014-09-23 ENCOUNTER — Telehealth: Payer: Self-pay

## 2014-09-23 MED ORDER — VITAMIN D (ERGOCALCIFEROL) 1.25 MG (50000 UNIT) PO CAPS
50000.0000 [IU] | ORAL_CAPSULE | ORAL | Status: DC
Start: 2014-09-23 — End: 2015-08-18

## 2014-09-23 NOTE — Telephone Encounter (Signed)
-----   Message from Lorayne Marek, MD sent at 09/15/2014 11:23 AM EDT ----- Blood work reviewed, noticed low vitamin D, call patient advise to start ergocalciferol 50,000 units once a week for the duration of  12 weeks. Also his blood sugar is elevated, advise patient to take the insulin regularly which he was started on the recent visit

## 2014-09-23 NOTE — Telephone Encounter (Signed)
Patient not available Left message on voice mail to return our call 

## 2014-09-27 ENCOUNTER — Ambulatory Visit: Payer: Self-pay | Attending: Internal Medicine

## 2014-10-04 ENCOUNTER — Ambulatory Visit: Payer: Self-pay | Attending: Internal Medicine | Admitting: *Deleted

## 2014-10-04 VITALS — BP 107/72 | HR 92 | Temp 97.8°F | Resp 14

## 2014-10-04 DIAGNOSIS — Z0130 Encounter for examination of blood pressure without abnormal findings: Secondary | ICD-10-CM

## 2014-10-04 DIAGNOSIS — R410 Disorientation, unspecified: Secondary | ICD-10-CM | POA: Insufficient documentation

## 2014-10-04 DIAGNOSIS — I10 Essential (primary) hypertension: Secondary | ICD-10-CM | POA: Insufficient documentation

## 2014-10-04 DIAGNOSIS — I2510 Atherosclerotic heart disease of native coronary artery without angina pectoris: Secondary | ICD-10-CM | POA: Insufficient documentation

## 2014-10-04 DIAGNOSIS — E119 Type 2 diabetes mellitus without complications: Secondary | ICD-10-CM | POA: Insufficient documentation

## 2014-10-04 DIAGNOSIS — R42 Dizziness and giddiness: Secondary | ICD-10-CM | POA: Insufficient documentation

## 2014-10-04 MED ORDER — GLUCOSE BLOOD VI STRP: ORAL_STRIP | Status: DC

## 2014-10-04 MED ORDER — HYDROCHLOROTHIAZIDE 12.5 MG PO CAPS: 12.5000 mg | ORAL_CAPSULE | Freq: Every day | ORAL | Status: DC

## 2014-10-04 NOTE — Progress Notes (Signed)
Patient presents for BP check Med list reviewed; states taking all meds as directed except  stopped lantus after 4 days due to N/V on day 2 and day 4 Discussed need for low sodium diet and using Mrs. Dash as alternative to salt Encouraged to choose foods with 5% or less of daily value for sodium. Patient walking 60 minutes per day for exercise Patient denies headaches, blurred vision, SHOB, chest pain or pressure Has had 5 recent episodes of lightheadedness and dizziness while outside walking or playing with grandchild lasting 5 minutes C/o episodes of confusion, difficulty concentrating, finding words since August 2015 Reports AM fasting BS ranging 106-178 Patient given blood sugar log and instructed on use. Instructed to bring to all future visits. Labs from last OV reviewed. Patient will begin Vit D  Filed Vitals:   10/04/14 1030  BP: 107/72  Pulse: 92  Temp: 97.8 F (36.6 C)  Resp: 14  SpO2 100%  Patient advised to call for med refills at least 7 days before running out so as not to go without.  Encouraged patient to meet with In-house Health Coach for diabetic meal planning  Patient aware that he is to f/u with PCP 3 months from last visit (Due 12/15/14) or sooner to discuss confusion  Patient given literature on O'Brien, Fat and Cholesterol Control Diet Diabetes and Food, Diabetes and Exercise, Basic Carb Counting, Hypoglycemia, and The Plate Method

## 2014-10-04 NOTE — Patient Instructions (Signed)
Hypoglycemia Hypoglycemia occurs when the glucose in your blood is too low. Glucose is a type of sugar that is your body's main energy source. Hormones, such as insulin and glucagon, control the level of glucose in the blood. Insulin lowers blood glucose and glucagon increases blood glucose. Having too much insulin in your blood stream, or not eating enough food containing sugar, can result in hypoglycemia. Hypoglycemia can happen to people with or without diabetes. It can develop quickly and can be a medical emergency.  CAUSES   Missing or delaying meals.  Not eating enough carbohydrates at meals.  Taking too much diabetes medicine.  Not timing your oral diabetes medicine or insulin doses with meals, snacks, and exercise.  Nausea and vomiting.  Certain medicines.  Severe illnesses, such as hepatitis, kidney disorders, and certain eating disorders.  Increased activity or exercise without eating something extra or adjusting medicines.  Drinking too much alcohol.  A nerve disorder that affects body functions like your heart rate, blood pressure, and digestion (autonomic neuropathy).  A condition where the stomach muscles do not function properly (gastroparesis). Therefore, medicines and food may not absorb properly.  Rarely, a tumor of the pancreas can produce too much insulin. SYMPTOMS   Hunger.  Sweating (diaphoresis).  Change in body temperature.  Shakiness.  Headache.  Anxiety.  Lightheadedness.  Irritability.  Difficulty concentrating.  Dry mouth.  Tingling or numbness in the hands or feet.  Restless sleep or sleep disturbances.  Altered speech and coordination.  Change in mental status.  Seizures or prolonged convulsions.  Combativeness.  Drowsiness (lethargic).  Weakness.  Increased heart rate or palpitations.  Confusion.  Pale, gray skin color.  Blurred or double vision.  Fainting. DIAGNOSIS  A physical exam and medical history will be  performed. Your caregiver may make a diagnosis based on your symptoms. Blood tests and other lab tests may be performed to confirm a diagnosis. Once the diagnosis is made, your caregiver will see if your signs and symptoms go away once your blood glucose is raised.  TREATMENT  Usually, you can easily treat your hypoglycemia when you notice symptoms.  Check your blood glucose. If it is less than 70 mg/dl, take one of the following:   3-4 glucose tablets.    cup juice.    cup regular soda.   1 cup skim milk.   -1 tube of glucose gel.   5-6 hard candies.   Avoid high-fat drinks or food that may delay a rise in blood glucose levels.  Do not take more than the recommended amount of sugary foods, drinks, gel, or tablets. Doing so will cause your blood glucose to go too high.   Wait 10-15 minutes and recheck your blood glucose. If it is still less than 70 mg/dl or below your target range, repeat treatment.   Eat a snack if it is more than 1 hour until your next meal.  There may be a time when your blood glucose may go so low that you are unable to treat yourself at home when you start to notice symptoms. You may need someone to help you. You may even faint or be unable to swallow. If you cannot treat yourself, someone will need to bring you to the hospital.  HOME CARE INSTRUCTIONS  If you have diabetes, follow your diabetes management plan by:  Taking your medicines as directed.  Following your exercise plan.  Following your meal plan. Do not skip meals. Eat on time.  Testing your blood   glucose regularly. Check your blood glucose before and after exercise. If you exercise longer or different than usual, be sure to check blood glucose more frequently.  Wearing your medical alert jewelry that says you have diabetes.  Identify the cause of your hypoglycemia. Then, develop ways to prevent the recurrence of hypoglycemia.  Do not take a hot bath or shower right after an  insulin shot.  Always carry treatment with you. Glucose tablets are the easiest to carry.  If you are going to drink alcohol, drink it only with meals.  Tell friends or family members ways to keep you safe during a seizure. This may include removing hard or sharp objects from the area or turning you on your side.  Maintain a healthy weight. SEEK MEDICAL CARE IF:   You are having problems keeping your blood glucose in your target range.  You are having frequent episodes of hypoglycemia.  You feel you might be having side effects from your medicines.  You are not sure why your blood glucose is dropping so low.  You notice a change in vision or a new problem with your vision. SEEK IMMEDIATE MEDICAL CARE IF:   Confusion develops.  A change in mental status occurs.  The inability to swallow develops.  Fainting occurs. Document Released: 06/03/2005 Document Revised: 06/08/2013 Document Reviewed: 09/30/2011 [facility] Patient Information 2015 [place], [place]. This information is not intended to replace advice given to you by your health care provider. Make sure you discuss any questions you have with your health care provider. Diabetes Mellitus and Food It is important for you to manage your blood sugar (glucose) level. Your blood glucose level can be greatly affected by what you eat. Eating healthier foods in the appropriate amounts throughout the day at about the same time each day will help you control your blood glucose level. It can also help slow or prevent worsening of your diabetes mellitus. Healthy eating may even help you improve the level of your blood pressure and reach or maintain a healthy weight.  HOW CAN FOOD AFFECT ME? Carbohydrates Carbohydrates affect your blood glucose level more than any other type of food. Your dietitian will help you determine how many carbohydrates to eat at each meal and teach you how to count carbohydrates. Counting carbohydrates is important to  keep your blood glucose at a healthy level, especially if you are using insulin or taking certain medicines for diabetes mellitus. Alcohol Alcohol can cause sudden decreases in blood glucose (hypoglycemia), especially if you use insulin or take certain medicines for diabetes mellitus. Hypoglycemia can be a life-threatening condition. Symptoms of hypoglycemia (sleepiness, dizziness, and disorientation) are similar to symptoms of having too much alcohol.  If your health care provider has given you approval to drink alcohol, do so in moderation and use the following guidelines:  Women should not have more than one drink per day, and men should not have more than two drinks per day. One drink is equal to:  12 oz of beer.  5 oz of wine.  1 oz of hard liquor.  Do not drink on an empty stomach.  Keep yourself hydrated. Have water, diet soda, or unsweetened iced tea.  Regular soda, juice, and other mixers might contain a lot of carbohydrates and should be counted. WHAT FOODS ARE NOT RECOMMENDED? As you make food choices, it is important to remember that all foods are not the same. Some foods have fewer nutrients per serving than other foods, even though they might have  the same number of calories or carbohydrates. It is difficult to get your body what it needs when you eat foods with fewer nutrients. Examples of foods that you should avoid that are high in calories and carbohydrates but low in nutrients include:  Trans fats (most processed foods list trans fats on the Nutrition Facts label).  Regular soda.  Juice.  Candy.  Sweets, such as cake, pie, doughnuts, and cookies.  Fried foods. WHAT FOODS CAN I EAT? Have nutrient-rich foods, which will nourish your body and keep you healthy. The food you should eat also will depend on several factors, including:  The calories you need.  The medicines you take.  Your weight.  Your blood glucose level.  Your blood pressure level.  Your  cholesterol level. You also should eat a variety of foods, including:  Protein, such as meat, poultry, fish, tofu, nuts, and seeds (lean animal proteins are best).  Fruits.  Vegetables.  Dairy products, such as milk, cheese, and yogurt (low fat is best).  Breads, grains, pasta, cereal, rice, and beans.  Fats such as olive oil, trans fat-free margarine, canola oil, avocado, and olives. DOES EVERYONE WITH DIABETES MELLITUS HAVE THE SAME MEAL PLAN? Because every person with diabetes mellitus is different, there is not one meal plan that works for everyone. It is very important that you meet with a dietitian who will help you create a meal plan that is just right for you. Document Released: 02/28/2005 Document Revised: 06/08/2013 Document Reviewed: 04/30/2013 [facility] Patient Information 2015 [place], [place]. This information is not intended to replace advice given to you by your health care provider. Make sure you discuss any questions you have with your health care provider. Diabetes and Exercise Exercising regularly is important. It is not just about losing weight. It has many health benefits, such as:  Improving your overall fitness, flexibility, and endurance.  Increasing your bone density.  Helping with weight control.  Decreasing your body fat.  Increasing your muscle strength.  Reducing stress and tension.  Improving your overall health. People with diabetes who exercise gain additional benefits because exercise:  Reduces appetite.  Improves the body's use of blood sugar (glucose).  Helps lower or control blood glucose.  Decreases blood pressure.  Helps control blood lipids (such as cholesterol and triglycerides).  Improves the body's use of the hormone insulin by:  Increasing the body's insulin sensitivity.  Reducing the body's insulin needs.  Decreases the risk for heart disease because exercising:  Lowers cholesterol and triglycerides levels.  Increases  the levels of good cholesterol (such as high-density lipoproteins [HDL]) in the body.  Lowers blood glucose levels. YOUR ACTIVITY PLAN  Choose an activity that you enjoy and set realistic goals. Your health care provider or diabetes educator can help you make an activity plan that works for you. Exercise regularly as directed by your health care provider. This includes:  Performing resistance training twice a week such as push-ups, sit-ups, lifting weights, or using resistance bands.  Performing 150 minutes of cardio exercises each week such as walking, running, or playing sports.  Staying active and spending no more than 90 minutes at one time being inactive. Even short bursts of exercise are good for you. Three 10-minute sessions spread throughout the day are just as beneficial as a single 30-minute session. Some exercise ideas include:  Taking the dog for a walk.  Taking the stairs instead of the elevator.  Dancing to your favorite song.  Doing an exercise video.  Doing your favorite exercise with a friend. RECOMMENDATIONS FOR EXERCISING WITH TYPE 1 OR TYPE 2 DIABETES   Check your blood glucose before exercising. If blood glucose levels are greater than 240 mg/dL, check for urine ketones. Do not exercise if ketones are present.  Avoid injecting insulin into areas of the body that are going to be exercised. For example, avoid injecting insulin into:  The arms when playing tennis.  The legs when jogging.  Keep a record of:  Food intake before and after you exercise.  Expected peak times of insulin action.  Blood glucose levels before and after you exercise.  The type and amount of exercise you have done.  Review your records with your health care provider. Your health care provider will help you to develop guidelines for adjusting food intake and insulin amounts before and after exercising.  If you take insulin or oral hypoglycemic agents, watch for signs and symptoms of  hypoglycemia. They include:  Dizziness.  Shaking.  Sweating.  Chills.  Confusion.  Drink plenty of water while you exercise to prevent dehydration or heat stroke. Body water is lost during exercise and must be replaced.  Talk to your health care provider before starting an exercise program to make sure it is safe for you. Remember, almost any type of activity is better than none. Document Released: 08/24/2003 Document Revised: 10/18/2013 Document Reviewed: 11/10/2012 [facility] Patient Information 2015 [place], [place]. This information is not intended to replace advice given to you by your health care provider. Make sure you discuss any questions you have with your health care provider. Basic Carbohydrate Counting for Diabetes Mellitus Carbohydrate counting is a method for keeping track of the amount of carbohydrates you eat. Eating carbohydrates naturally increases the level of sugar (glucose) in your blood, so it is important for you to know the amount that is okay for you to have in every meal. Carbohydrate counting helps keep the level of glucose in your blood within normal limits. The amount of carbohydrates allowed is different for every person. A dietitian can help you calculate the amount that is right for you. Once you know the amount of carbohydrates you can have, you can count the carbohydrates in the foods you want to eat. Carbohydrates are found in the following foods:  Grains, such as breads and cereals.  Dried beans and soy products.  Starchy vegetables, such as potatoes, peas, and corn.  Fruit and fruit juices.  Milk and yogurt.  Sweets and snack foods, such as cake, cookies, candy, chips, soft drinks, and fruit drinks. CARBOHYDRATE COUNTING There are two ways to count the carbohydrates in your food. You can use either of the methods or a combination of both. Reading the "Nutrition Facts" on [place] The "Nutrition Facts" is an area that is included on the labels  of almost all packaged food and beverages in the [place]. It includes the serving size of that food or beverage and information about the nutrients in each serving of the food, including the grams (g) of carbohydrate per serving.  Decide the number of servings of this food or beverage that you will be able to eat or drink. Multiply that number of servings by the number of grams of carbohydrate that is listed on the label for that serving. The total will be the amount of carbohydrates you will be having when you eat or drink this food or beverage. Learning Standard Serving Sizes of Food When you eat food that is not packaged  or does not include "Nutrition Facts" on the label, you need to measure the servings in order to count the amount of carbohydrates.A serving of most carbohydrate-rich foods contains about 15 g of carbohydrates. The following list includes serving sizes of carbohydrate-rich foods that provide 15 g ofcarbohydrate per serving:   1 slice of bread (1 oz) or 1 six-inch tortilla.    of a hamburger bun or English muffin.  4-6 crackers.   cup unsweetened dry cereal.    cup hot cereal.   cup rice or pasta.    cup mashed potatoes or  of a large baked potato.  1 cup fresh fruit or one small piece of fruit.    cup canned or frozen fruit or fruit juice.  1 cup milk.   cup plain fat-free yogurt or yogurt sweetened with artificial sweeteners.   cup cooked dried beans or starchy vegetable, such as peas, corn, or potatoes.  Decide the number of standard-size servings that you will eat. Multiply that number of servings by 15 (the grams of carbohydrates in that serving). For example, if you eat 2 cups of strawberries, you will have eaten 2 servings and 30 g of carbohydrates (2 servings x 15 g = 30 g). For foods such as soups and casseroles, in which more than one food is mixed in, you will need to count the carbohydrates in each food that is included. EXAMPLE OF  CARBOHYDRATE COUNTING Sample Dinner  3 oz chicken breast.   cup of brown rice.   cup of corn.  1 cup milk.   1 cup strawberries with sugar-free whipped topping.  Carbohydrate Calculation Step 1: Identify the foods that contain carbohydrates:   Rice.   Corn.   Milk.   Strawberries. Step 2:Calculate the number of servings eaten of each:   2 servings of rice.   1 serving of corn.   1 serving of milk.   1 serving of strawberries. Step 3: Multiply each of those number of servings by 15 g:   2 servings of rice x 15 g = 30 g.   1 serving of corn x 15 g = 15 g.   1 serving of milk x 15 g = 15 g.   1 serving of strawberries x 15 g = 15 g. Step 4: Add together all of the amounts to find the total grams of carbohydrates eaten: 30 g + 15 g + 15 g + 15 g = 75 g. Document Released: 06/03/2005 Document Revised: 10/18/2013 Document Reviewed: 04/30/2013 [facility] Patient Information 2015 [place], [place]. This information is not intended to replace advice given to you by your health care provider. Make sure you discuss any questions you have with your health care provider. Fat and Cholesterol Control Diet Fat and cholesterol levels in your blood and organs are influenced by your diet. High levels of fat and cholesterol may lead to diseases of the heart, small and large blood vessels, gallbladder, liver, and pancreas. CONTROLLING FAT AND CHOLESTEROL WITH DIET Although exercise and lifestyle factors are important, your diet is key. That is because certain foods are known to raise cholesterol and others to lower it. The goal is to balance foods for their effect on cholesterol and more importantly, to replace saturated and trans fat with other types of fat, such as monounsaturated fat, polyunsaturated fat, and omega-3 fatty acids. On average, a person should consume no more than 15 to 17 g of saturated fat daily. Saturated and trans fats are considered "bad" fats,  and they  will raise LDL cholesterol. Saturated fats are primarily found in animal products such as meats, butter, and cream. However, that does not mean you need to give up all your favorite foods. Today, there are good tasting, low-fat, low-cholesterol substitutes for most of the things you like to eat. Choose low-fat or nonfat alternatives. Choose round or loin cuts of red meat. These types of cuts are lowest in fat and cholesterol. Chicken (without the skin), fish, veal, and ground [place] breast are great choices. Eliminate fatty meats, such as hot dogs and salami. Even shellfish have little or no saturated fat. Have a 3 oz (85 g) portion when you eat lean meat, poultry, or fish. Trans fats are also called "partially hydrogenated oils." They are oils that have been scientifically manipulated so that they are solid at room temperature resulting in a longer shelf life and improved taste and texture of foods in which they are added. Trans fats are found in stick margarine, some tub margarines, cookies, crackers, and baked goods.  When baking and cooking, oils are a great substitute for butter. The monounsaturated oils are especially beneficial since it is believed they lower LDL and raise HDL. The oils you should avoid entirely are saturated tropical oils, such as coconut and palm.  Remember to eat a lot from food groups that are naturally free of saturated and trans fat, including fish, fruit, vegetables, beans, grains (barley, rice, couscous, bulgur wheat), and pasta (without cream sauces).  IDENTIFYING FOODS THAT LOWER FAT AND CHOLESTEROL  Soluble fiber may lower your cholesterol. This type of fiber is found in fruits such as apples, vegetables such as broccoli, potatoes, and carrots, legumes such as beans, peas, and lentils, and grains such as barley. Foods fortified with plant sterols (phytosterol) may also lower cholesterol. You should eat at least 2 g per day of these foods for a cholesterol lowering effect.   Read package labels to identify low-saturated fats, trans fat free, and low-fat foods at the supermarket. Select cheeses that have only 2 to 3 g saturated fat per ounce. Use a heart-healthy tub margarine that is free of trans fats or partially hydrogenated oil. When buying baked goods (cookies, crackers), avoid partially hydrogenated oils. Breads and muffins should be made from whole grains (whole-wheat or whole oat flour, instead of "flour" or "enriched flour"). Buy non-creamy canned soups with reduced salt and no added fats.  FOOD PREPARATION TECHNIQUES  Never deep-fry. If you must fry, either stir-fry, which uses very little fat, or use non-stick cooking sprays. When possible, broil, bake, or roast meats, and steam vegetables. Instead of putting butter or margarine on vegetables, use lemon and herbs, applesauce, and cinnamon (for squash and sweet potatoes). Use nonfat yogurt, salsa, and low-fat dressings for salads.  LOW-SATURATED FAT / LOW-FAT FOOD SUBSTITUTES Meats / Saturated Fat (g)  Avoid: Steak, marbled (3 oz/85 g) / 11 g  Choose: Steak, lean (3 oz/85 g) / 4 g  Avoid: Hamburger (3 oz/85 g) / 7 g  Choose: Hamburger, lean (3 oz/85 g) / 5 g  Avoid: Ham (3 oz/85 g) / 6 g  Choose: Ham, lean cut (3 oz/85 g) / 2.4 g  Avoid: Chicken, with skin, dark meat (3 oz/85 g) / 4 g  Choose: Chicken, skin removed, dark meat (3 oz/85 g) / 2 g  Avoid: Chicken, with skin, light meat (3 oz/85 g) / 2.5 g  Choose: Chicken, skin removed, light meat (3 oz/85 g) / 1 g Dairy /  Saturated Fat (g)  Avoid: Whole milk (1 cup) / 5 g  Choose: Low-fat milk, 2% (1 cup) / 3 g  Choose: Low-fat milk, 1% (1 cup) / 1.5 g  Choose: Skim milk (1 cup) / 0.3 g  Avoid: Hard cheese (1 oz/28 g) / 6 g  Choose: Skim milk cheese (1 oz/28 g) / 2 to 3 g  Avoid: Cottage cheese, 4% fat (1 cup) / 6.5 g  Choose: Low-fat cottage cheese, 1% fat (1 cup) / 1.5 g  Avoid: Ice cream (1 cup) / 9 g  Choose: Sherbet (1 cup) / 2.5  g  Choose: Nonfat frozen yogurt (1 cup) / 0.3 g  Choose: Frozen fruit bar / trace  Avoid: Whipped cream (1 tbs) / 3.5 g  Choose: Nondairy whipped topping (1 tbs) / 1 g Condiments / Saturated Fat (g)  Avoid: Mayonnaise (1 tbs) / 2 g  Choose: Low-fat mayonnaise (1 tbs) / 1 g  Avoid: Butter (1 tbs) / 7 g  Choose: Extra light margarine (1 tbs) / 1 g  Avoid: Coconut oil (1 tbs) / 11.8 g  Choose: Olive oil (1 tbs) / 1.8 g  Choose: Corn oil (1 tbs) / 1.7 g  Choose: Safflower oil (1 tbs) / 1.2 g  Choose: Sunflower oil (1 tbs) / 1.4 g  Choose: Soybean oil (1 tbs) / 2.4 g  Choose: Canola oil (1 tbs) / 1 g Document Released: 06/03/2005 Document Revised: 09/28/2012 Document Reviewed: 09/01/2013 [employer] Patient Information 2015 [place], [place]. This information is not intended to replace advice given to you by your health care provider. Make sure you discuss any questions you have with your health care provider. DASH Eating Plan DASH stands for "Dietary Approaches to Stop Hypertension." The DASH eating plan is a healthy eating plan that has been shown to reduce high blood pressure (hypertension). Additional health benefits may include reducing the risk of type 2 diabetes mellitus, heart disease, and stroke. The DASH eating plan may also help with weight loss. WHAT DO I NEED TO KNOW ABOUT THE DASH EATING PLAN? For the DASH eating plan, you will follow these general guidelines:  Choose foods with a percent daily value for sodium of less than 5% (as listed on the food label).  Use salt-free seasonings or herbs instead of table salt or sea salt.  Check with your health care provider or pharmacist before using salt substitutes.  Eat lower-sodium products, often labeled as "lower sodium" or "no salt added."  Eat fresh foods.  Eat more vegetables, fruits, and low-fat dairy products.  Choose whole grains. Look for the word "whole" as the first word in the ingredient list.  Choose  fish and skinless chicken or [place] more often than red meat. Limit fish, poultry, and meat to 6 oz (170 g) each day.  Limit sweets, desserts, sugars, and sugary drinks.  Choose heart-healthy fats.  Limit cheese to 1 oz (28 g) per day.  Eat more home-cooked food and less restaurant, buffet, and fast food.  Limit fried foods.  Cook foods using methods other than frying.  Limit canned vegetables. If you do use them, rinse them well to decrease the sodium.  When eating at a restaurant, ask that your food be prepared with less salt, or no salt if possible. WHAT FOODS CAN I EAT? Seek help from a dietitian for individual calorie needs. Grains Whole grain or whole wheat bread. Brown rice. Whole grain or whole wheat pasta. Quinoa, bulgur, and whole grain cereals. Low-sodium  cereals. Corn or whole wheat flour tortillas. Whole grain cornbread. Whole grain crackers. Low-sodium crackers. Vegetables Fresh or frozen vegetables (raw, steamed, roasted, or grilled). Low-sodium or reduced-sodium tomato and vegetable juices. Low-sodium or reduced-sodium tomato sauce and paste. Low-sodium or reduced-sodium canned vegetables.  Fruits All fresh, canned (in natural juice), or frozen fruits. Meat and Other Protein Products Ground beef (85% or leaner), grass-fed beef, or beef trimmed of fat. Skinless chicken or [place]. Ground chicken or [place]. Pork trimmed of fat. All fish and seafood. Eggs. Dried beans, peas, or lentils. Unsalted nuts and seeds. Unsalted canned beans. Dairy Low-fat dairy products, such as skim or 1% milk, 2% or reduced-fat cheeses, low-fat ricotta or cottage cheese, or plain low-fat yogurt. Low-sodium or reduced-sodium cheeses. Fats and Oils Tub margarines without trans fats. Light or reduced-fat mayonnaise and salad dressings (reduced sodium). Avocado. Safflower, olive, or canola oils. Natural peanut or almond butter. Other Unsalted popcorn and pretzels. The items listed above may not be  a complete list of recommended foods or beverages. Contact your dietitian for more options. WHAT FOODS ARE NOT RECOMMENDED? Grains White bread. White pasta. White rice. Refined cornbread. Bagels and croissants. Crackers that contain trans fat. Vegetables Creamed or fried vegetables. Vegetables in a cheese sauce. Regular canned vegetables. Regular canned tomato sauce and paste. Regular tomato and vegetable juices. Fruits Dried fruits. Canned fruit in light or heavy syrup. Fruit juice. Meat and Other Protein Products Fatty cuts of meat. Ribs, chicken wings, bacon, sausage, bologna, salami, chitterlings, fatback, hot dogs, bratwurst, and packaged luncheon meats. Salted nuts and seeds. Canned beans with salt. Dairy Whole or 2% milk, cream, half-and-half, and cream cheese. Whole-fat or sweetened yogurt. Full-fat cheeses or blue cheese. Nondairy creamers and whipped toppings. Processed cheese, cheese spreads, or cheese curds. Condiments Onion and garlic salt, seasoned salt, table salt, and sea salt. Canned and packaged gravies. Worcestershire sauce. Tartar sauce. Barbecue sauce. Teriyaki sauce. Soy sauce, including reduced sodium. Steak sauce. Fish sauce. Oyster sauce. Cocktail sauce. Horseradish. Ketchup and mustard. Meat flavorings and tenderizers. Bouillon cubes. Hot sauce. Tabasco sauce. Marinades. Taco seasonings. Relishes. Fats and Oils Butter, stick margarine, lard, shortening, ghee, and bacon fat. Coconut, palm kernel, or palm oils. Regular salad dressings. Other Pickles and olives. Salted popcorn and pretzels. The items listed above may not be a complete list of foods and beverages to avoid. Contact your dietitian for more information. WHERE CAN I FIND MORE INFORMATION? National Heart, Lung, and Blood Institute: travelstabloid.com Document Released: 05/23/2011 Document Revised: 10/18/2013 Document Reviewed: 04/07/2013 [facility] Patient Information 2015  [place], [place]. This information is not intended to replace advice given to you by your health care provider. Make sure you discuss any questions you have with your health care provider.

## 2014-11-23 ENCOUNTER — Encounter: Payer: Self-pay | Admitting: Internal Medicine

## 2014-11-23 ENCOUNTER — Ambulatory Visit: Payer: Self-pay | Attending: Internal Medicine | Admitting: Internal Medicine

## 2014-11-23 VITALS — BP 124/87 | HR 100 | Temp 98.0°F | Resp 16 | Wt 170.8 lb

## 2014-11-23 DIAGNOSIS — I2510 Atherosclerotic heart disease of native coronary artery without angina pectoris: Secondary | ICD-10-CM | POA: Insufficient documentation

## 2014-11-23 DIAGNOSIS — Z79899 Other long term (current) drug therapy: Secondary | ICD-10-CM | POA: Insufficient documentation

## 2014-11-23 DIAGNOSIS — E119 Type 2 diabetes mellitus without complications: Secondary | ICD-10-CM | POA: Insufficient documentation

## 2014-11-23 DIAGNOSIS — I10 Essential (primary) hypertension: Secondary | ICD-10-CM | POA: Insufficient documentation

## 2014-11-23 DIAGNOSIS — K219 Gastro-esophageal reflux disease without esophagitis: Secondary | ICD-10-CM | POA: Insufficient documentation

## 2014-11-23 DIAGNOSIS — R1013 Epigastric pain: Secondary | ICD-10-CM

## 2014-11-23 DIAGNOSIS — E139 Other specified diabetes mellitus without complications: Secondary | ICD-10-CM

## 2014-11-23 DIAGNOSIS — Z794 Long term (current) use of insulin: Secondary | ICD-10-CM | POA: Insufficient documentation

## 2014-11-23 DIAGNOSIS — Z7982 Long term (current) use of aspirin: Secondary | ICD-10-CM | POA: Insufficient documentation

## 2014-11-23 DIAGNOSIS — K649 Unspecified hemorrhoids: Secondary | ICD-10-CM | POA: Insufficient documentation

## 2014-11-23 LAB — GLUCOSE, POCT (MANUAL RESULT ENTRY): POC Glucose: 285 mg/dl — AB (ref 70–99)

## 2014-11-23 LAB — POCT GLYCOSYLATED HEMOGLOBIN (HGB A1C): Hemoglobin A1C: 10.8

## 2014-11-23 MED ORDER — HYDROCORTISONE ACETATE 25 MG RE SUPP: 25.0000 mg | Freq: Two times a day (BID) | RECTAL | Status: DC

## 2014-11-23 MED ORDER — HYDROCORTISONE ACE-PRAMOXINE 2.5-1 % EX CREA: TOPICAL_CREAM | CUTANEOUS | Status: DC

## 2014-11-23 MED ORDER — OMEPRAZOLE 20 MG PO CPDR: 20.0000 mg | DELAYED_RELEASE_CAPSULE | Freq: Every day | ORAL | Status: DC

## 2014-11-23 MED ORDER — GLIPIZIDE 5 MG PO TABS: 5.0000 mg | ORAL_TABLET | Freq: Two times a day (BID) | ORAL | Status: DC

## 2014-11-23 NOTE — Progress Notes (Signed)
Patient complains of having stomach issues for the past year Complains of feeling gassy, having some gurgling and bubbling sounds going on Patient states when he burps he has an awful odor Patient also states he has been having blood in his stool that started about a month ago

## 2014-11-23 NOTE — Progress Notes (Signed)
MRN: [id number] Name: Shane [name]  Sex: male Age: 44 y.o. DOB: 04-Sep-1970  Allergies: Review of patient's allergies indicates no known allergies.  Chief Complaint  Patient presents with  . Gastrophageal Reflux    HPI: Patient is 44 y.o. male who has history of diabetes hypertension hyperlipidemia comes today for followup, on the last visit he was started on Lantus and as per patient he could not tolerate it caused him nausea vomiting any discontinued, currently taking metformin, as per patient his fasting blood sugars between 165-265 mg a deciliter, his hemoglobin A1c has improved but still uncontrolled, she's also complaining of reflux symptoms bloating as well as he has noticed bright red blood per rectum for the last few weeks, currently denies any nausea vomiting any abdominal pain. Patient has tried over-the-counter heartburn medication without much improvement  Past Medical History  Diagnosis Date  . Hypertension   . Diabetes mellitus without complication   . Coronary artery disease     Past Surgical History  Procedure Laterality Date  . Hernia repair    . Tonsills         Medication List       This list is accurate as of: 11/23/14  4:48 PM.  Always use your most recent med list.               aspirin 81 MG chewable tablet  Chew 1 tablet (81 mg total) by mouth daily.     blood glucose meter kit and supplies Kit  Dispense based on patient and insurance preference. Use up to four times daily as directed. (FOR ICD-9 250.00, 250.01).     carvedilol 12.5 MG tablet  Commonly known as:  COREG  Take 1 tablet (12.5 mg total) by mouth 2 (two) times daily with a meal.     glipiZIDE 5 MG tablet  Commonly known as:  GLUCOTROL  Take 1 tablet (5 mg total) by mouth 2 (two) times daily before a meal.     glucose blood test strip  Commonly known as:  ACCU-CHEK AVIVA PLUS  Use as instructed     hydrochlorothiazide 12.5 MG capsule  Commonly known as:  MICROZIDE  Take 1  capsule (12.5 mg total) by mouth daily.     hydrocortisone 25 MG suppository  Commonly known as:  ANUSOL-HC  Place 1 suppository (25 mg total) rectally 2 (two) times daily.     Insulin Glargine 100 UNIT/ML Solostar Pen  Commonly known as:  LANTUS  Inject 10 Units into the skin daily at 10 pm.     lisinopril 20 MG tablet  Commonly known as:  PRINIVIL,ZESTRIL  Take 2 tablets (40 mg total) by mouth daily.     metFORMIN 1000 MG tablet  Commonly known as:  GLUCOPHAGE  Take 1 tablet (1,000 mg total) by mouth 2 (two) times daily with a meal.     omeprazole 20 MG capsule  Commonly known as:  PRILOSEC  Take 1 capsule (20 mg total) by mouth daily.     pravastatin 40 MG tablet  Commonly known as:  PRAVACHOL  Take 1 tablet (40 mg total) by mouth daily.     Vitamin D (Ergocalciferol) 50000 UNITS Caps capsule  Commonly known as:  DRISDOL  Take 1 capsule (50,000 Units total) by mouth every 7 (seven) days.        Meds ordered this encounter  Medications  . hydrocortisone (ANUSOL-HC) 25 MG suppository    Sig: Place 1 suppository (25 mg total)  rectally 2 (two) times daily.    Dispense:  12 suppository    Refill:  0  . omeprazole (PRILOSEC) 20 MG capsule    Sig: Take 1 capsule (20 mg total) by mouth daily.    Dispense:  30 capsule    Refill:  3  . glipiZIDE (GLUCOTROL) 5 MG tablet    Sig: Take 1 tablet (5 mg total) by mouth 2 (two) times daily before a meal.    Dispense:  60 tablet    Refill:  3    Immunization History  Administered Date(s) Administered  . Pneumococcal Polysaccharide-23 09/14/2014    Family History  Problem Relation Age of Onset  . Hypertension Mother   . Diabetes Mother   . Hyperlipidemia Mother   . Heart disease Mother   . Diabetes Father   . Hypertension Father   . Stroke Father   . Heart disease Father     History  Substance Use Topics  . Smoking status: Never Smoker   . Smokeless tobacco: Never Used  . Alcohol Use: No    Review of  Systems   As noted in HPI  Filed Vitals:   11/23/14 1624  BP: 124/87  Pulse: 100  Temp: 98 F (36.7 C)  Resp: 16    Physical Exam  Physical Exam  Constitutional: No distress.  Eyes: EOM are normal. Pupils are equal, round, and reactive to light.  Cardiovascular: Normal rate and regular rhythm.   Pulmonary/Chest: Breath sounds normal. No respiratory distress. He has no wheezes. He has no rales.  Abdominal: Soft. There is no tenderness. There is no rebound.    CBC    Component Value Date/Time   WBC 6.9 08/26/2014 0505   RBC 4.64 08/26/2014 0505   HGB 13.7 08/26/2014 0505   HCT 40.8 08/26/2014 0505   PLT 219 08/26/2014 0505   MCV 87.9 08/26/2014 0505   LYMPHSABS 2.0 08/25/2014 1406   MONOABS 0.4 08/25/2014 1406   EOSABS 0.0 08/25/2014 1406   BASOSABS 0.0 08/25/2014 1406    CMP     Component Value Date/Time   NA 141 09/14/2014 0949   K 4.5 09/14/2014 0949   CL 102 09/14/2014 0949   CO2 30 09/14/2014 0949   GLUCOSE 259* 09/14/2014 0949   BUN 15 09/14/2014 0949   CREATININE 0.89 09/14/2014 0949   CREATININE 0.69 08/26/2014 0505   CALCIUM 10.1 09/14/2014 0949   PROT 7.2 09/14/2014 0949   ALBUMIN 4.4 09/14/2014 0949   AST 16 09/14/2014 0949   ALT 14 09/14/2014 0949   ALKPHOS 51 09/14/2014 0949   BILITOT 0.3 09/14/2014 0949   GFRNONAA >89 09/14/2014 0949   GFRNONAA >90 08/26/2014 0505   GFRAA >89 09/14/2014 0949   GFRAA >90 08/26/2014 0505    Lab Results  Component Value Date/Time   CHOL 203* 08/26/2014 05:05 AM    Lab Results  Component Value Date/Time   HGBA1C 10.80 11/23/2014 04:22 PM   HGBA1C 11.9* 08/26/2014 05:05 AM    Lab Results  Component Value Date/Time   AST 16 09/14/2014 09:49 AM    Assessment and Plan  Other specified diabetes mellitus without complications - Plan:  Results for orders placed or performed in visit on 11/23/14  Glucose (CBG)  Result Value Ref Range   POC Glucose 285.0 (A) 70 - 99 mg/dl  HgB A1c  Result Value Ref  Range   Hemoglobin A1C 10.80    Hemoglobin A1c has trended down but is still 10.8%, patient will  continue with metformin I have added Glucotrol, advise patient to keep the fingerstick log, advise patient for diabetes meal planning, repeat HgB A1c in 3 months , glipiZIDE (GLUCOTROL) 5 MG tablet  Essential hypertension Blood pressure is well controlled, continue with current meds  Dyspepsia - Plan: Advised patient for lifestyle modification, trial of omeprazole (PRILOSEC) 20 MG capsule  Hemorrhoids, unspecified hemorrhoid type - Plan: hydrocortisone (ANUSOL-HC) 25 MG suppository  Fasting blood work on the next visit.  Return in about 3 months (around 02/23/2015), or if symptoms worsen or fail to improve.   This note has been created with Surveyor, quantity. Any transcriptional errors are unintentional.    Lorayne Marek, MD

## 2014-11-23 NOTE — Patient Instructions (Signed)
DASH Eating Plan DASH stands for "Dietary Approaches to Stop Hypertension." The DASH eating plan is a healthy eating plan that has been shown to reduce high blood pressure (hypertension). Additional health benefits may include reducing the risk of type 2 diabetes mellitus, heart disease, and stroke. The DASH eating plan may also help with weight loss. WHAT DO I NEED TO KNOW ABOUT THE DASH EATING PLAN? For the DASH eating plan, you will follow these general guidelines:  Choose foods with a percent daily value for sodium of less than 5% (as listed on the food label).  Use salt-free seasonings or herbs instead of table salt or sea salt.  Check with your health care provider or pharmacist before using salt substitutes.  Eat lower-sodium products, often labeled as "lower sodium" or "no salt added."  Eat fresh foods.  Eat more vegetables, fruits, and low-fat dairy products.  Choose whole grains. Look for the word "whole" as the first word in the ingredient list.  Choose fish and skinless chicken or turkey more often than red meat. Limit fish, poultry, and meat to 6 oz (170 g) each day.  Limit sweets, desserts, sugars, and sugary drinks.  Choose heart-healthy fats.  Limit cheese to 1 oz (28 g) per day.  Eat more home-cooked food and less restaurant, buffet, and fast food.  Limit fried foods.  Cook foods using methods other than frying.  Limit canned vegetables. If you do use them, rinse them well to decrease the sodium.  When eating at a restaurant, ask that your food be prepared with less salt, or no salt if possible. WHAT FOODS CAN I EAT? Seek help from a dietitian for individual calorie needs. Grains Whole grain or whole wheat bread. Brown rice. Whole grain or whole wheat pasta. Quinoa, bulgur, and whole grain cereals. Low-sodium cereals. Corn or whole wheat flour tortillas. Whole grain cornbread. Whole grain crackers. Low-sodium crackers. Vegetables Fresh or frozen vegetables  (raw, steamed, roasted, or grilled). Low-sodium or reduced-sodium tomato and vegetable juices. Low-sodium or reduced-sodium tomato sauce and paste. Low-sodium or reduced-sodium canned vegetables.  Fruits All fresh, canned (in natural juice), or frozen fruits. Meat and Other Protein Products Ground beef (85% or leaner), grass-fed beef, or beef trimmed of fat. Skinless chicken or turkey. Ground chicken or turkey. Pork trimmed of fat. All fish and seafood. Eggs. Dried beans, peas, or lentils. Unsalted nuts and seeds. Unsalted canned beans. Dairy Low-fat dairy products, such as skim or 1% milk, 2% or reduced-fat cheeses, low-fat ricotta or cottage cheese, or plain low-fat yogurt. Low-sodium or reduced-sodium cheeses. Fats and Oils Tub margarines without trans fats. Light or reduced-fat mayonnaise and salad dressings (reduced sodium). Avocado. Safflower, olive, or canola oils. Natural peanut or almond butter. Other Unsalted popcorn and pretzels. The items listed above may not be a complete list of recommended foods or beverages. Contact your dietitian for more options. WHAT FOODS ARE NOT RECOMMENDED? Grains White bread. White pasta. White rice. Refined cornbread. Bagels and croissants. Crackers that contain trans fat. Vegetables Creamed or fried vegetables. Vegetables in a cheese sauce. Regular canned vegetables. Regular canned tomato sauce and paste. Regular tomato and vegetable juices. Fruits Dried fruits. Canned fruit in light or heavy syrup. Fruit juice. Meat and Other Protein Products Fatty cuts of meat. Ribs, chicken wings, bacon, sausage, bologna, salami, chitterlings, fatback, hot dogs, bratwurst, and packaged luncheon meats. Salted nuts and seeds. Canned beans with salt. Dairy Whole or 2% milk, cream, half-and-half, and cream cheese. Whole-fat or sweetened yogurt. Full-fat   cheeses or blue cheese. Nondairy creamers and whipped toppings. Processed cheese, cheese spreads, or cheese  curds. Condiments Onion and garlic salt, seasoned salt, table salt, and sea salt. Canned and packaged gravies. Worcestershire sauce. Tartar sauce. Barbecue sauce. Teriyaki sauce. Soy sauce, including reduced sodium. Steak sauce. Fish sauce. Oyster sauce. Cocktail sauce. Horseradish. Ketchup and mustard. Meat flavorings and tenderizers. Bouillon cubes. Hot sauce. Tabasco sauce. Marinades. Taco seasonings. Relishes. Fats and Oils Butter, stick margarine, lard, shortening, ghee, and bacon fat. Coconut, palm kernel, or palm oils. Regular salad dressings. Other Pickles and olives. Salted popcorn and pretzels. The items listed above may not be a complete list of foods and beverages to avoid. Contact your dietitian for more information. WHERE CAN I FIND MORE INFORMATION? National Heart, Lung, and Blood Institute: www.nhlbi.nih.gov/health/health-topics/topics/dash/ Document Released: 05/23/2011 Document Revised: 10/18/2013 Document Reviewed: 04/07/2013 [employer] Patient Information 2015 [employer]. This information is not intended to replace advice given to you by your health care provider. Make sure you discuss any questions you have with your health care provider. Diabetes Mellitus and Food It is important for you to manage your blood sugar (glucose) level. Your blood glucose level can be greatly affected by what you eat. Eating healthier foods in the appropriate amounts throughout the day at about the same time each day will help you control your blood glucose level. It can also help slow or prevent worsening of your diabetes mellitus. Healthy eating may even help you improve the level of your blood pressure and reach or maintain a healthy weight.  HOW CAN FOOD AFFECT ME? Carbohydrates Carbohydrates affect your blood glucose level more than any other type of food. Your dietitian will help you determine how many carbohydrates to eat at each meal and teach you how to count carbohydrates. Counting  carbohydrates is important to keep your blood glucose at a healthy level, especially if you are using insulin or taking certain medicines for diabetes mellitus. Alcohol Alcohol can cause sudden decreases in blood glucose (hypoglycemia), especially if you use insulin or take certain medicines for diabetes mellitus. Hypoglycemia can be a life-threatening condition. Symptoms of hypoglycemia (sleepiness, dizziness, and disorientation) are similar to symptoms of having too much alcohol.  If your health care provider has given you approval to drink alcohol, do so in moderation and use the following guidelines:  Women should not have more than one drink per day, and men should not have more than two drinks per day. One drink is equal to:  12 oz of beer.  5 oz of wine.  1 oz of hard liquor.  Do not drink on an empty stomach.  Keep yourself hydrated. Have water, diet soda, or unsweetened iced tea.  Regular soda, juice, and other mixers might contain a lot of carbohydrates and should be counted. WHAT FOODS ARE NOT RECOMMENDED? As you make food choices, it is important to remember that all foods are not the same. Some foods have fewer nutrients per serving than other foods, even though they might have the same number of calories or carbohydrates. It is difficult to get your body what it needs when you eat foods with fewer nutrients. Examples of foods that you should avoid that are high in calories and carbohydrates but low in nutrients include:  Trans fats (most processed foods list trans fats on the Nutrition Facts label).  Regular soda.  Juice.  Candy.  Sweets, such as cake, pie, doughnuts, and cookies.  Fried foods. WHAT FOODS CAN I EAT? Have nutrient-rich foods,   which will nourish your body and keep you healthy. The food you should eat also will depend on several factors, including:  The calories you need.  The medicines you take.  Your weight.  Your blood glucose level.  Your  blood pressure level.  Your cholesterol level. You also should eat a variety of foods, including:  Protein, such as meat, poultry, fish, tofu, nuts, and seeds (lean animal proteins are best).  Fruits.  Vegetables.  Dairy products, such as milk, cheese, and yogurt (low fat is best).  Breads, grains, pasta, cereal, rice, and beans.  Fats such as olive oil, trans fat-free margarine, canola oil, avocado, and olives. DOES EVERYONE WITH DIABETES MELLITUS HAVE THE SAME MEAL PLAN? Because every person with diabetes mellitus is different, there is not one meal plan that works for everyone. It is very important that you meet with a dietitian who will help you create a meal plan that is just right for you. Document Released: 02/28/2005 Document Revised: 06/08/2013 Document Reviewed: 04/30/2013 [employer] Patient Information 2015 [employer]. This information is not intended to replace advice given to you by your health care provider. Make sure you discuss any questions you have with your health care provider.  

## 2014-11-29 ENCOUNTER — Encounter (HOSPITAL_COMMUNITY): Payer: Self-pay | Admitting: *Deleted

## 2014-11-29 ENCOUNTER — Emergency Department (HOSPITAL_COMMUNITY): Payer: Self-pay

## 2014-11-29 DIAGNOSIS — Z79899 Other long term (current) drug therapy: Secondary | ICD-10-CM | POA: Insufficient documentation

## 2014-11-29 DIAGNOSIS — E119 Type 2 diabetes mellitus without complications: Secondary | ICD-10-CM | POA: Insufficient documentation

## 2014-11-29 DIAGNOSIS — Z7982 Long term (current) use of aspirin: Secondary | ICD-10-CM | POA: Insufficient documentation

## 2014-11-29 DIAGNOSIS — R110 Nausea: Secondary | ICD-10-CM | POA: Insufficient documentation

## 2014-11-29 DIAGNOSIS — H53149 Visual discomfort, unspecified: Secondary | ICD-10-CM | POA: Insufficient documentation

## 2014-11-29 DIAGNOSIS — I10 Essential (primary) hypertension: Secondary | ICD-10-CM | POA: Insufficient documentation

## 2014-11-29 DIAGNOSIS — I2510 Atherosclerotic heart disease of native coronary artery without angina pectoris: Secondary | ICD-10-CM | POA: Insufficient documentation

## 2014-11-29 DIAGNOSIS — R51 Headache: Secondary | ICD-10-CM | POA: Insufficient documentation

## 2014-11-29 DIAGNOSIS — R42 Dizziness and giddiness: Secondary | ICD-10-CM | POA: Insufficient documentation

## 2014-11-29 DIAGNOSIS — R0789 Other chest pain: Secondary | ICD-10-CM | POA: Insufficient documentation

## 2014-11-29 LAB — BASIC METABOLIC PANEL
Anion gap: 8 (ref 5–15)
BUN: 13 mg/dL (ref 6–20)
CALCIUM: 9.4 mg/dL (ref 8.9–10.3)
CO2: 26 mmol/L (ref 22–32)
CREATININE: 0.76 mg/dL (ref 0.61–1.24)
Chloride: 106 mmol/L (ref 101–111)
GFR calc Af Amer: >60 mL/min (ref >60)
GLUCOSE: 178 mg/dL — AB (ref 65–99)
POTASSIUM: 3.7 mmol/L (ref 3.5–5.1)
Sodium: 140 mmol/L (ref 135–145)

## 2014-11-29 LAB — CBC
HEMATOCRIT: 35.6 % — AB (ref 39.0–52.0)
Hemoglobin: 12 g/dL — ABNORMAL LOW (ref 13.0–17.0)
MCH: 29.5 pg (ref 26.0–34.0)
MCHC: 33.7 g/dL (ref 30.0–36.0)
MCV: 87.5 fL (ref 78.0–100.0)
PLATELETS: 217 10*3/uL (ref 150–400)
RBC: 4.07 MIL/uL — ABNORMAL LOW (ref 4.22–5.81)
RDW: 12.9 % (ref 11.5–15.5)
WBC: 8 10*3/uL (ref 4.0–10.5)

## 2014-11-29 LAB — I-STAT TROPONIN, ED: TROPONIN I, POC: 0 ng/mL (ref 0.00–0.08)

## 2014-11-29 LAB — BRAIN NATRIURETIC PEPTIDE: B Natriuretic Peptide: 39.3 pg/mL (ref 0.0–100.0)

## 2014-11-29 NOTE — ED Notes (Signed)
Pt in c/o headache since yesterday with light sensitivity, this afternoon developed chest pain, pain is to center of chest pain, worse with taking a deep breath, no distress noted

## 2014-11-30 ENCOUNTER — Emergency Department (HOSPITAL_COMMUNITY)
Admission: EM | Admit: 2014-11-30 | Discharge: 2014-11-30 | Disposition: A | Discharge status: Home / Self Care | Payer: Self-pay | Attending: Emergency Medicine | Admitting: Emergency Medicine

## 2014-11-30 ENCOUNTER — Emergency Department (HOSPITAL_COMMUNITY): Payer: Self-pay

## 2014-11-30 DIAGNOSIS — R0789 Other chest pain: Secondary | ICD-10-CM

## 2014-11-30 DIAGNOSIS — R51 Headache: Secondary | ICD-10-CM

## 2014-11-30 DIAGNOSIS — R519 Headache, unspecified: Secondary | ICD-10-CM

## 2014-11-30 MED ORDER — ONDANSETRON 8 MG PO TBDP: 8.0000 mg | ORAL_TABLET | Freq: Three times a day (TID) | ORAL | Status: DC | PRN

## 2014-11-30 MED ORDER — METOCLOPRAMIDE HCL 5 MG/ML IJ SOLN
10.0000 mg | Freq: Once | INTRAMUSCULAR | Status: AC
  Administered 2014-11-30: 10 mg via INTRAVENOUS
  Filled 2014-11-30: qty 2

## 2014-11-30 MED ORDER — KETOROLAC TROMETHAMINE 30 MG/ML IJ SOLN
30.0000 mg | Freq: Once | INTRAMUSCULAR | Status: AC
  Administered 2014-11-30: 30 mg via INTRAVENOUS
  Filled 2014-11-30: qty 1

## 2014-11-30 MED ORDER — DIPHENHYDRAMINE HCL 50 MG/ML IJ SOLN
25.0000 mg | Freq: Once | INTRAMUSCULAR | Status: AC
  Administered 2014-11-30: 25 mg via INTRAVENOUS
  Filled 2014-11-30: qty 1

## 2014-11-30 NOTE — ED Provider Notes (Signed)
CSN: [id number]     Arrival date & time 11/29/14  2228 History  This chart was scribed for Linton Flemings, MD by Rayfield Citizen, ED Scribe. This patient was seen in room A13C/A13C and the patient's care was started at 2:19 AM.    Chief Complaint  Patient presents with  . Headache  . Chest Pain   The history is provided by the patient. No language interpreter was used.    HPI Comments: Shane [name] is a 44 y.o. male with past medical history of HTN, DM, CAD (stress test in March 2016) who presents to the Emergency Department complaining of 3 days of headache with associated nausea, dizziness and photophobia. He also notes constant chest pain, beginning around 17:00 on 6/14, worse with deep breathing and palpation. Patient states he took an ibuprofen the morning of 6/14 without relief.  He denies fevers, congestion, SOB, weakness, numbness, speech difficulty. He denies recent trauma or injury. He denies a personal or familial history of migraines; he denies frequent headaches in the past.  He takes Aspirin 81mg  daily; he began a new DM medication (Glucotrol 5mg  2x daily) on 6/11.   Past Medical History  Diagnosis Date  . Hypertension   . Diabetes mellitus without complication   . Coronary artery disease    Past Surgical History  Procedure Laterality Date  . Hernia repair    . Tonsills      Family History  Problem Relation Age of Onset  . Hypertension Mother   . Diabetes Mother   . Hyperlipidemia Mother   . Heart disease Mother   . Diabetes Father   . Hypertension Father   . Stroke Father   . Heart disease Father    History  Substance Use Topics  . Smoking status: Never Smoker   . Smokeless tobacco: Never Used  . Alcohol Use: No    Review of Systems  Constitutional: Negative for fever.  HENT: Negative for congestion.   Eyes: Positive for photophobia.  Respiratory: Negative for shortness of breath.   Cardiovascular: Positive for chest pain.  Gastrointestinal: Positive for  nausea.  Neurological: Positive for dizziness and headaches. Negative for speech difficulty, weakness and numbness.  All other systems reviewed and are negative.   Allergies  Review of patient's allergies indicates no known allergies.  Home Medications   Prior to Admission medications   Medication Sig Start Date End Date Taking? Authorizing Provider  aspirin EC 81 MG tablet Take 81 mg by mouth daily.   Yes Historical Provider, MD  carvedilol (COREG) 12.5 MG tablet Take 1 tablet (12.5 mg total) by mouth 2 (two) times daily with a meal. 09/14/14  Yes Deepak Advani, MD  glipiZIDE (GLUCOTROL) 5 MG tablet Take 1 tablet (5 mg total) by mouth 2 (two) times daily before a meal. 11/23/14  Yes Deepak Advani, MD  hydrochlorothiazide (MICROZIDE) 12.5 MG capsule Take 1 capsule (12.5 mg total) by mouth daily. 10/04/14  Yes Deepak Advani, MD  lisinopril (PRINIVIL,ZESTRIL) 20 MG tablet Take 2 tablets (40 mg total) by mouth daily. 08/26/14  Yes Barton Dubois, MD  metFORMIN (GLUCOPHAGE) 1000 MG tablet Take 1 tablet (1,000 mg total) by mouth 2 (two) times daily with a meal. 09/14/14  Yes Deepak Advani, MD  omeprazole (PRILOSEC) 20 MG capsule Take 1 capsule (20 mg total) by mouth daily. 11/23/14  Yes Deepak Advani, MD  pantoprazole (PROTONIX) 40 MG tablet Take 40 mg by mouth daily.   Yes Historical Provider, MD  Vitamin D, Ergocalciferol, (DRISDOL)  50000 UNITS CAPS capsule Take 1 capsule (50,000 Units total) by mouth every 7 (seven) days. 09/23/14  Yes Lorayne Marek, MD  hydrocortisone (ANUSOL-HC) 25 MG suppository Place 1 suppository (25 mg total) rectally 2 (two) times daily. Patient not taking: Reported on 11/30/2014 11/23/14   Lorayne Marek, MD  pravastatin (PRAVACHOL) 40 MG tablet Take 1 tablet (40 mg total) by mouth daily. Patient not taking: Reported on 11/30/2014 08/26/14   Barton Dubois, MD   BP 112/69 mmHg  Pulse 79  Temp(Src) 98.3 F (36.8 C) (Oral)  Resp 32  Ht 5\' 7"  (1.702 m)  Wt 170 lb (77.111 kg)  BMI  26.62 kg/m2  SpO2 100% Physical Exam  Constitutional: He is oriented to person, place, and time. He appears well-developed and well-nourished. No distress.  HENT:  Head: Normocephalic and atraumatic.  Mouth/Throat: Oropharynx is clear and moist. No oropharyngeal exudate.  Moist mucous membranes  Eyes: EOM are normal. Pupils are equal, round, and reactive to light.  Appears photophobic  Neck: Normal range of motion. Neck supple. No JVD present.  Cardiovascular: Normal rate, regular rhythm and normal heart sounds.  Exam reveals no gallop and no friction rub.   No murmur heard. Pulmonary/Chest: Effort normal and breath sounds normal. No respiratory distress. He has no wheezes. He has no rales. He exhibits tenderness.  Left lateral chest wall pain  Abdominal: Soft. Bowel sounds are normal. He exhibits no mass. There is no tenderness. There is no rebound and no guarding.  Musculoskeletal: Normal range of motion. He exhibits no edema.  Moves all extremities normally.   Lymphadenopathy:    He has no cervical adenopathy.  Neurological: He is alert and oriented to person, place, and time. He displays normal reflexes. No cranial nerve deficit.  Skin: Skin is warm and dry. No rash noted.  Psychiatric: He has a normal mood and affect. His behavior is normal.  Nursing note and vitals reviewed.   ED Course  Procedures   DIAGNOSTIC STUDIES: Oxygen Saturation is 99% on RA, normal by my interpretation.    COORDINATION OF CARE: 2:27 AM Discussed treatment plan with pt at bedside and pt agreed to plan.   Labs Review Labs Reviewed  CBC - Abnormal; Notable for the following:    RBC 4.07 (*)    Hemoglobin 12.0 (*)    HCT 35.6 (*)    All other components within normal limits  BASIC METABOLIC PANEL - Abnormal; Notable for the following:    Glucose, Bld 178 (*)    All other components within normal limits  BRAIN NATRIURETIC PEPTIDE  I-STAT TROPOININ, ED    Imaging Review Dg Chest 2  View  11/29/2014   CLINICAL DATA:  Chest pain, shortness of breath, and headache today.  EXAM: CHEST  2 VIEW  COMPARISON:  08/25/2014  FINDINGS: Shallow inspiration. The heart size and mediastinal contours are within normal limits. Both lungs are clear. The visualized skeletal structures are unremarkable.  IMPRESSION: No active cardiopulmonary disease.   Electronically Signed   By: Lucienne Capers M.D.   On: 11/29/2014 23:21   Ct Head Wo Contrast  11/30/2014   CLINICAL DATA:  Acute onset of headache. Light sensitivity. Initial encounter.  EXAM: CT HEAD WITHOUT CONTRAST  TECHNIQUE: Contiguous axial images were obtained from the base of the skull through the vertex without intravenous contrast.  COMPARISON:  CT of the head and MRI/MRA of the brain performed 08/25/2014  FINDINGS: There is no evidence of acute infarction, mass lesion, or intra-  or extra-axial hemorrhage on CT.  Vaguely decreased attenuation at the right pons is thought to be artifactual in nature.  The posterior fossa, including the cerebellum, brainstem and fourth ventricle, is within normal limits. The third and lateral ventricles, and basal ganglia are unremarkable in appearance. The cerebral hemispheres are symmetric in appearance, with normal gray-white differentiation. No mass effect or midline shift is seen.  There is no evidence of fracture; visualized osseous structures are unremarkable in appearance. The orbits are within normal limits. The paranasal sinuses and mastoid air cells are well-aerated. No significant soft tissue abnormalities are seen.  IMPRESSION: Unremarkable noncontrast CT of the head.   Electronically Signed   By: Garald Balding M.D.   On: 11/30/2014 03:00     EKG Interpretation   Date/Time:  Tuesday November 29 2014 22:40:22 EDT Ventricular Rate:  82 PR Interval:  162 QRS Duration: 98 QT Interval:  372 QTC Calculation: 434 R Axis:   111 Text Interpretation:  Normal sinus rhythm Left posterior fascicular block   Nonspecific ST abnormality Abnormal ECG Confirmed by Bernardette Waldron  MD, Shadae Reino  ([id number]) on 11/30/2014 1:58:15 AM      MDM   Final diagnoses:  Headache  Chest wall pain    I personally performed the services described in this documentation, which was scribed in my presence. The recorded information has been reviewed and is accurate.  44 year old male with headache since Sunday, not normally having headaches.  Started on Glucotrol Friday.  Headache seems migrainous in nature with photo and phonophobia with nausea.  Patient also developed sharp chest pain today which is reproducible with palpation.  Pain has been constant since 5 or 6:00.  troponin is negative, EKG without ischemic changes.  Chest x-ray negative.  Plan for head CT, given new onset of headaches, hypertension, diabetes.  Will treat with headache cocktail.  Will have patient stop Glucotrol.  Will send a note to his primary care doctor for reevaluation     Linton Flemings, MD 11/30/14 [phone]

## 2014-11-30 NOTE — Discharge Instructions (Signed)
Your workup in the ER has not shown a specific cause for your symptoms.  As you recently started the Glucotrol, it may be due to this medication.  Hold off taking the medication until you speak with your doctor.  Rest today.  Drink plenty of fluids.   Chest Wall Pain Chest wall pain is pain felt in or around the chest bones and muscles. It may take up to 6 weeks to get better. It may take longer if you are active. Chest wall pain can happen on its own. Other times, things like germs, injury, coughing, or exercise can cause the pain. HOME CARE   Avoid activities that make you tired or cause pain. Try not to use your chest, belly (abdominal), or side muscles. Do not use heavy weights.  Put ice on the sore area.  Put ice in a plastic bag.  Place a towel between your skin and the bag.  Leave the ice on for 15-20 minutes for the first 2 days.  Only take medicine as told by your doctor. GET HELP RIGHT AWAY IF:   You have more pain or are very uncomfortable.  You have a fever.  Your chest pain gets worse.  You have new problems.  You feel sick to your stomach (nauseous) or throw up (vomit).  You start to sweat or feel lightheaded.  You have a cough with mucus (phlegm).  You cough up blood. MAKE SURE YOU:   Understand these instructions.  Will watch your condition.  Will get help right away if you are not doing well or get worse. Document Released: 11/20/2007 Document Revised: 08/26/2011 Document Reviewed: 01/28/2011 [facility] Patient Information 2015 [place], [place]. This information is not intended to replace advice given to you by your health care provider. Make sure you discuss any questions you have with your health care provider.  General Headache Without Cause A headache is pain or discomfort felt around the head or neck area. The specific cause of a headache may not be found. There are many causes and types of headaches. A few common ones are:  Tension  headaches.  Migraine headaches.  Cluster headaches.  Chronic daily headaches. HOME CARE INSTRUCTIONS   Keep all follow-up appointments with your caregiver or any specialist referral.  Only take over-the-counter or prescription medicines for pain or discomfort as directed by your caregiver.  Lie down in a dark, quiet room when you have a headache.  Keep a headache journal to find out what may trigger your migraine headaches. For example, write down:  What you eat and drink.  How much sleep you get.  Any change to your diet or medicines.  Try massage or other relaxation techniques.  Put ice packs or heat on the head and neck. Use these 3 to 4 times per day for 15 to 20 minutes each time, or as needed.  Limit stress.  Sit up straight, and do not tense your muscles.  Quit smoking if you smoke.  Limit alcohol use.  Decrease the amount of caffeine you drink, or stop drinking caffeine.  Eat and sleep on a regular schedule.  Get 7 to 9 hours of sleep, or as recommended by your caregiver.  Keep lights dim if bright lights bother you and make your headaches worse. SEEK MEDICAL CARE IF:   You have problems with the medicines you were prescribed.  Your medicines are not working.  You have a change from the usual headache.  You have nausea or vomiting. SEEK IMMEDIATE  MEDICAL CARE IF:   Your headache becomes severe.  You have a fever.  You have a stiff neck.  You have loss of vision.  You have muscular weakness or loss of muscle control.  You start losing your balance or have trouble walking.  You feel faint or pass out.  You have severe symptoms that are different from your first symptoms. MAKE SURE YOU:   Understand these instructions.  Will watch your condition.  Will get help right away if you are not doing well or get worse. Document Released: 06/03/2005 Document Revised: 08/26/2011 Document Reviewed: 06/19/2011 [facility] Patient Information 2015  [place], [place]. This information is not intended to replace advice given to you by your health care provider. Make sure you discuss any questions you have with your health care provider.

## 2014-11-30 NOTE — ED Notes (Signed)
Pt back from CT with transporter

## 2015-01-22 ENCOUNTER — Encounter (HOSPITAL_COMMUNITY): Payer: Self-pay

## 2015-01-22 ENCOUNTER — Emergency Department (HOSPITAL_COMMUNITY)
Admission: EM | Admit: 2015-01-22 | Discharge: 2015-01-22 | Disposition: A | Discharge status: Home / Self Care | Payer: Worker's Compensation | Attending: Emergency Medicine | Admitting: Emergency Medicine

## 2015-01-22 DIAGNOSIS — Z7982 Long term (current) use of aspirin: Secondary | ICD-10-CM | POA: Diagnosis not present

## 2015-01-22 DIAGNOSIS — Z79899 Other long term (current) drug therapy: Secondary | ICD-10-CM | POA: Insufficient documentation

## 2015-01-22 DIAGNOSIS — E119 Type 2 diabetes mellitus without complications: Secondary | ICD-10-CM | POA: Insufficient documentation

## 2015-01-22 DIAGNOSIS — I2510 Atherosclerotic heart disease of native coronary artery without angina pectoris: Secondary | ICD-10-CM | POA: Insufficient documentation

## 2015-01-22 DIAGNOSIS — Y9289 Other specified places as the place of occurrence of the external cause: Secondary | ICD-10-CM | POA: Diagnosis not present

## 2015-01-22 DIAGNOSIS — Y288XXA Contact with other sharp object, undetermined intent, initial encounter: Secondary | ICD-10-CM | POA: Insufficient documentation

## 2015-01-22 DIAGNOSIS — Y9389 Activity, other specified: Secondary | ICD-10-CM | POA: Insufficient documentation

## 2015-01-22 DIAGNOSIS — S81012A Laceration without foreign body, left knee, initial encounter: Secondary | ICD-10-CM

## 2015-01-22 DIAGNOSIS — Z23 Encounter for immunization: Secondary | ICD-10-CM | POA: Diagnosis not present

## 2015-01-22 DIAGNOSIS — Y990 Civilian activity done for income or pay: Secondary | ICD-10-CM | POA: Diagnosis not present

## 2015-01-22 DIAGNOSIS — I10 Essential (primary) hypertension: Secondary | ICD-10-CM | POA: Insufficient documentation

## 2015-01-22 MED ORDER — LIDOCAINE HCL 2 % IJ SOLN: 20.0000 mL | Freq: Once | INTRAMUSCULAR | Status: DC

## 2015-01-22 MED ORDER — LIDOCAINE HCL 2 % IJ SOLN
INTRAMUSCULAR | Status: AC
Start: 2015-01-22 — End: 2015-01-22
  Administered 2015-01-22: 400 mg
  Filled 2015-01-22: qty 20

## 2015-01-22 MED ORDER — TETANUS-DIPHTH-ACELL PERTUSSIS 5-2.5-18.5 LF-MCG/0.5 IM SUSP
0.5000 mL | Freq: Once | INTRAMUSCULAR | Status: AC
  Administered 2015-01-22: 0.5 mL via INTRAMUSCULAR
  Filled 2015-01-22: qty 0.5

## 2015-01-22 NOTE — ED Notes (Signed)
Pt states he was at work and was cut with a razor blade by accident. Has approxiamtely 3-4 inch laceration on his left knee. Pain 6/10

## 2015-01-22 NOTE — ED Provider Notes (Signed)
CSN: [id number]     Arrival date & time 01/22/15  2105 History  This chart was scribed for non-physician practitioner Irena Cords, PA-C working with Forde Dandy, MD by Ludger Nutting, ED Scribe. This patient was seen in room WTR6/WTR6 and the patient's care was started at 9:23 PM.    Chief Complaint  Patient presents with  . Laceration    The history is provided by the patient. No language interpreter was used.     HPI Comments: Shane [name] is a 44 y.o. male who presents to the Emergency Department complaining of a laceration above the left knee with associated mild pain that occurred earlier today. Patient was at work when he was accidentally cut by a boxcutter. He states his pain is a 6/10 currently. Bleeding is controlled with pressure. He denies any other injuries or complaints at this time.   Past Medical History  Diagnosis Date  . Hypertension   . Diabetes mellitus without complication   . Coronary artery disease    Past Surgical History  Procedure Laterality Date  . Hernia repair    . Tonsills      Family History  Problem Relation Age of Onset  . Hypertension Mother   . Diabetes Mother   . Hyperlipidemia Mother   . Heart disease Mother   . Diabetes Father   . Hypertension Father   . Stroke Father   . Heart disease Father    History  Substance Use Topics  . Smoking status: Never Smoker   . Smokeless tobacco: Never Used  . Alcohol Use: No    Review of Systems  Skin: Positive for wound (laceration).  Neurological: Negative for weakness and numbness.      Allergies  Review of patient's allergies indicates no known allergies.  Home Medications   Prior to Admission medications   Medication Sig Start Date End Date Taking? Authorizing Provider  aspirin EC 81 MG tablet Take 81 mg by mouth daily.    Historical Provider, MD  carvedilol (COREG) 12.5 MG tablet Take 1 tablet (12.5 mg total) by mouth 2 (two) times daily with a meal. 09/14/14   Lorayne Marek, MD   glipiZIDE (GLUCOTROL) 5 MG tablet Take 1 tablet (5 mg total) by mouth 2 (two) times daily before a meal. 11/23/14   Lorayne Marek, MD  hydrochlorothiazide (MICROZIDE) 12.5 MG capsule Take 1 capsule (12.5 mg total) by mouth daily. 10/04/14   Lorayne Marek, MD  hydrocortisone (ANUSOL-HC) 25 MG suppository Place 1 suppository (25 mg total) rectally 2 (two) times daily. Patient not taking: Reported on 11/30/2014 11/23/14   Lorayne Marek, MD  lisinopril (PRINIVIL,ZESTRIL) 20 MG tablet Take 2 tablets (40 mg total) by mouth daily. 08/26/14   Barton Dubois, MD  metFORMIN (GLUCOPHAGE) 1000 MG tablet Take 1 tablet (1,000 mg total) by mouth 2 (two) times daily with a meal. 09/14/14   Lorayne Marek, MD  omeprazole (PRILOSEC) 20 MG capsule Take 1 capsule (20 mg total) by mouth daily. 11/23/14   Lorayne Marek, MD  ondansetron (ZOFRAN ODT) 8 MG disintegrating tablet Take 1 tablet (8 mg total) by mouth every 8 (eight) hours as needed for nausea or vomiting. 11/30/14   Linton Flemings, MD  pantoprazole (PROTONIX) 40 MG tablet Take 40 mg by mouth daily.    Historical Provider, MD  pravastatin (PRAVACHOL) 40 MG tablet Take 1 tablet (40 mg total) by mouth daily. Patient not taking: Reported on 11/30/2014 08/26/14   Barton Dubois, MD  Vitamin D, Ergocalciferol, (DRISDOL)  50000 UNITS CAPS capsule Take 1 capsule (50,000 Units total) by mouth every 7 (seven) days. 09/23/14   Lorayne Marek, MD   BP 146/92 mmHg  Pulse 88  Temp(Src) 97.5 F (36.4 C) (Oral)  Resp 16  Ht 5\' 7"  (1.702 m)  Wt 170 lb (77.111 kg)  BMI 26.62 kg/m2  SpO2 100% Physical Exam  Constitutional: He is oriented to person, place, and time. He appears well-developed and well-nourished.  HENT:  Head: Normocephalic and atraumatic.  Cardiovascular: Normal rate.   Pulmonary/Chest: Effort normal.  Abdominal: He exhibits no distension.  Neurological: He is alert and oriented to person, place, and time.  Skin: Skin is warm and dry. Laceration noted.  3 inch laceration  superior to the left patella   Psychiatric: He has a normal mood and affect.  Nursing note and vitals reviewed.   ED Course  Procedures (including critical care time)  DIAGNOSTIC STUDIES: Oxygen Saturation is 100% on RA, normal by my interpretation.    COORDINATION OF CARE: 9:27 PM Discussed treatment plan with pt at bedside and pt agreed to plan.  9:27 PM LACERATION REPAIR PROCEDURE NOTE The patient's identification was confirmed and consent was obtained. This procedure was performed by Kamrynn Melott W  at 9:27 PM. Site: area superior to the patella Sterile procedures observed Anesthetic used (type and amt): 5 ml Suture type/size:4-0 Prolene Length:5 cm # of Sutures:11 Technique:Simple Interrupted Complexity Normal Antibx ointment applied Tetanus UTD or ordered Site anesthetized, irrigated with NS, explored without evidence of foreign body, wound well approximated, site covered with dry, sterile dressing.  Patient tolerated procedure well without complications. Instructions for care discussed verbally and patient provided with additional written instructions for homecare and f/u.    I personally performed the services described in this documentation, which was scribed in my presence. The recorded information has been reviewed and is accurate.   Dalia Heading, PA-C 01/24/15 [place], MD 01/24/15 [phone]

## 2015-01-22 NOTE — Discharge Instructions (Signed)
Have sutures out in 14 days. Keep the area clean and dry.  Clean with soap and water.  Do not use Neosporin

## 2015-01-24 ENCOUNTER — Other Ambulatory Visit: Payer: Self-pay | Admitting: Internal Medicine

## 2015-01-25 NOTE — Telephone Encounter (Signed)
Patient called requesting medication refill on lisinopril (PRINIVIL,ZESTRIL) 20 MG tablet. Patient states he is completely out of medication. Please f/u

## 2015-01-27 NOTE — Telephone Encounter (Signed)
Patient called requesting medication for Lisinopril. Please follow up.

## 2015-01-31 ENCOUNTER — Other Ambulatory Visit: Payer: Self-pay

## 2015-01-31 MED ORDER — LISINOPRIL 20 MG PO TABS: 40.0000 mg | ORAL_TABLET | Freq: Every day | ORAL | Status: DC

## 2015-02-19 ENCOUNTER — Encounter (HOSPITAL_COMMUNITY): Payer: Self-pay | Admitting: *Deleted

## 2015-02-19 ENCOUNTER — Emergency Department (HOSPITAL_COMMUNITY): Payer: Self-pay

## 2015-02-19 ENCOUNTER — Emergency Department (HOSPITAL_COMMUNITY)
Admission: EM | Admit: 2015-02-19 | Discharge: 2015-02-20 | Disposition: A | Discharge status: Home / Self Care | Payer: Self-pay | Attending: Emergency Medicine | Admitting: Emergency Medicine

## 2015-02-19 DIAGNOSIS — I2510 Atherosclerotic heart disease of native coronary artery without angina pectoris: Secondary | ICD-10-CM | POA: Insufficient documentation

## 2015-02-19 DIAGNOSIS — R079 Chest pain, unspecified: Secondary | ICD-10-CM | POA: Insufficient documentation

## 2015-02-19 DIAGNOSIS — Z7982 Long term (current) use of aspirin: Secondary | ICD-10-CM | POA: Insufficient documentation

## 2015-02-19 DIAGNOSIS — I10 Essential (primary) hypertension: Secondary | ICD-10-CM | POA: Insufficient documentation

## 2015-02-19 DIAGNOSIS — E119 Type 2 diabetes mellitus without complications: Secondary | ICD-10-CM | POA: Insufficient documentation

## 2015-02-19 DIAGNOSIS — A084 Viral intestinal infection, unspecified: Secondary | ICD-10-CM | POA: Insufficient documentation

## 2015-02-19 DIAGNOSIS — Z79899 Other long term (current) drug therapy: Secondary | ICD-10-CM | POA: Insufficient documentation

## 2015-02-19 LAB — CBC
HCT: 39.6 % (ref 39.0–52.0)
HEMOGLOBIN: 13.1 g/dL (ref 13.0–17.0)
MCH: 30.1 pg (ref 26.0–34.0)
MCHC: 33.1 g/dL (ref 30.0–36.0)
MCV: 91 fL (ref 78.0–100.0)
PLATELETS: 258 10*3/uL (ref 150–400)
RBC: 4.35 MIL/uL (ref 4.22–5.81)
RDW: 12.2 % (ref 11.5–15.5)
WBC: 6.8 10*3/uL (ref 4.0–10.5)

## 2015-02-19 LAB — URINALYSIS, ROUTINE W REFLEX MICROSCOPIC
Bilirubin Urine: NEGATIVE
Glucose, UA: 250 mg/dL — AB
KETONES UR: NEGATIVE mg/dL
LEUKOCYTES UA: NEGATIVE
Nitrite: NEGATIVE
Protein, ur: NEGATIVE mg/dL
Specific Gravity, Urine: 1.01 (ref 1.005–1.030)
UROBILINOGEN UA: 0.2 mg/dL (ref 0.0–1.0)
pH: 5.5 (ref 5.0–8.0)

## 2015-02-19 LAB — COMPREHENSIVE METABOLIC PANEL
ALT: 16 U/L — AB (ref 17–63)
ANION GAP: 5 (ref 5–15)
AST: 16 U/L (ref 15–41)
Albumin: 4 g/dL (ref 3.5–5.0)
Alkaline Phosphatase: 50 U/L (ref 38–126)
BILIRUBIN TOTAL: 0.5 mg/dL (ref 0.3–1.2)
BUN: 13 mg/dL (ref 6–20)
CO2: 31 mmol/L (ref 22–32)
CREATININE: 0.92 mg/dL (ref 0.61–1.24)
Calcium: 9.9 mg/dL (ref 8.9–10.3)
Chloride: 103 mmol/L (ref 101–111)
GFR calc Af Amer: >60 mL/min (ref >60)
GFR calc non Af Amer: >60 mL/min (ref >60)
Glucose, Bld: 179 mg/dL — ABNORMAL HIGH (ref 65–99)
Potassium: 4.7 mmol/L (ref 3.5–5.1)
Sodium: 139 mmol/L (ref 135–145)
TOTAL PROTEIN: 7.5 g/dL (ref 6.5–8.1)

## 2015-02-19 LAB — URINE MICROSCOPIC-ADD ON

## 2015-02-19 LAB — LIPASE, BLOOD: Lipase: 22 U/L (ref 22–51)

## 2015-02-19 LAB — TROPONIN I

## 2015-02-19 MED ORDER — ONDANSETRON 4 MG PO TBDP
4.0000 mg | ORAL_TABLET | Freq: Once | ORAL | Status: AC | PRN
  Administered 2015-02-19: 4 mg via ORAL

## 2015-02-19 MED ORDER — ONDANSETRON 4 MG PO TBDP
ORAL_TABLET | ORAL | Status: AC
  Filled 2015-02-19: qty 1

## 2015-02-19 MED ORDER — SODIUM CHLORIDE 0.9 % IV BOLUS (SEPSIS)
1000.0000 mL | Freq: Once | INTRAVENOUS | Status: AC
  Administered 2015-02-19: 1000 mL via INTRAVENOUS

## 2015-02-19 NOTE — ED Provider Notes (Signed)
CSN: [id number]     Arrival date & time 02/19/15  1820 History   First MD Initiated Contact with Patient 02/19/15 2207     Chief Complaint  Patient presents with  . Diarrhea  . Chest Pain   HPI   44 year old male presents today with nausea vomiting diarrhea and headache. Patient reports since Thursday he is having intermittent diarrhea. He notes last night he developed a headache, nausea, vomiting, continued diarrhea. He reports today he's had approximately 8 episodes of diarrhea and vomiting. Patient notes he has diffuse abdominal discomfort, no focal pain. He reports starting today developed left anterior chest pain, worse with palpation, worse with upper body movements; no associated shortness of breath. Patient denies fever, chills, bloody vomiting or bloody diarrhea. No exposure to abnormal food and drinks, known in-house experiencing similar symptoms.  Past Medical History  Diagnosis Date  . Hypertension   . Diabetes mellitus without complication   . Coronary artery disease    Past Surgical History  Procedure Laterality Date  . Hernia repair    . Tonsills     . Tonsillectomy     Family History  Problem Relation Age of Onset  . Hypertension Mother   . Diabetes Mother   . Hyperlipidemia Mother   . Heart disease Mother   . Diabetes Father   . Hypertension Father   . Stroke Father   . Heart disease Father    Social History  Substance Use Topics  . Smoking status: Never Smoker   . Smokeless tobacco: Never Used  . Alcohol Use: No    Review of Systems  All other systems reviewed and are negative.  Allergies  Eggs or egg-derived products and Milk-related compounds  Home Medications   Prior to Admission medications   Medication Sig Start Date End Date Taking? Authorizing Provider  aspirin EC 81 MG tablet Take 81 mg by mouth daily.   Yes Historical Provider, MD  bismuth subsalicylate (PEPTO BISMOL) 262 MG/15ML suspension Take 30 mLs by mouth every 6 (six) hours as  needed for diarrhea or loose stools (nausea).   Yes Historical Provider, MD  carvedilol (COREG) 12.5 MG tablet Take 1 tablet (12.5 mg total) by mouth 2 (two) times daily with a meal. 09/14/14  Yes Deepak Advani, MD  glipiZIDE (GLUCOTROL) 5 MG tablet Take 1 tablet (5 mg total) by mouth 2 (two) times daily before a meal. 11/23/14  Yes Deepak Advani, MD  hydrochlorothiazide (MICROZIDE) 12.5 MG capsule TAKE 1 CAPSULE BY MOUTH DAILY. 01/31/15  Yes Tresa Garter, MD  hydrocortisone (ANUSOL-HC) 25 MG suppository Place 1 suppository (25 mg total) rectally 2 (two) times daily. Patient taking differently: Place 25 mg rectally 2 (two) times daily as needed for hemorrhoids.  11/23/14  Yes Lorayne Marek, MD  lisinopril (PRINIVIL,ZESTRIL) 20 MG tablet Take 2 tablets (40 mg total) by mouth daily. 01/31/15  Yes Tresa Garter, MD  metFORMIN (GLUCOPHAGE) 1000 MG tablet Take 1 tablet (1,000 mg total) by mouth 2 (two) times daily with a meal. 09/14/14  Yes Deepak Advani, MD  omeprazole (PRILOSEC) 20 MG capsule Take 1 capsule (20 mg total) by mouth daily. 11/23/14  Yes Lorayne Marek, MD  pravastatin (PRAVACHOL) 40 MG tablet Take 1 tablet (40 mg total) by mouth daily. 08/26/14  Yes Barton Dubois, MD  Vitamin D, Ergocalciferol, (DRISDOL) 50000 UNITS CAPS capsule Take 1 capsule (50,000 Units total) by mouth every 7 (seven) days. Patient taking differently: Take 50,000 Units by mouth every 7 (seven) days.  On Sundays 09/23/14  Yes Deepak Advani, MD  ondansetron (ZOFRAN ODT) 8 MG disintegrating tablet Take 1 tablet (8 mg total) by mouth every 8 (eight) hours as needed for nausea or vomiting. Patient not taking: Reported on 02/19/2015 11/30/14   Linton Flemings, MD  ondansetron (ZOFRAN) 4 MG tablet Take 1 tablet (4 mg total) by mouth every 6 (six) hours. 02/20/15   Kali Deadwyler, PA-C   BP 167/89 mmHg  Pulse 85  Temp(Src) 97.9 F (36.6 C) (Oral)  Resp 9  Ht 5\' 7"  (1.702 m)  Wt 164 lb (74.39 kg)  BMI 25.68 kg/m2  SpO2 100%    Physical Exam  Constitutional: He is oriented to person, place, and time. He appears well-developed and well-nourished.  HENT:  Head: Normocephalic and atraumatic.  Eyes: Conjunctivae are normal. Pupils are equal, round, and reactive to light. Right eye exhibits no discharge. Left eye exhibits no discharge. No scleral icterus.  Neck: Normal range of motion. Neck supple. No JVD present. No tracheal deviation present.  Cardiovascular: Normal rate, regular rhythm, normal heart sounds and intact distal pulses.  Exam reveals no gallop and no friction rub.   No murmur heard. Pulmonary/Chest: Effort normal and breath sounds normal. No stridor. No respiratory distress. He has no wheezes. He has no rales. He exhibits no tenderness.  Tenderness to palpation of left anterior chest wall, no signs of infection, no obvious deformities, no signs of trauma.  Abdominal: Soft. Bowel sounds are increased. There is no hepatosplenomegaly, splenomegaly or hepatomegaly. There is tenderness. There is no rigidity, no rebound, no guarding, no CVA tenderness, no tenderness at McBurney's point and negative Murphy's sign.  Diffuse abdominal discomfort to palpation, no focal tenderness  Neurological: He is alert and oriented to person, place, and time. Coordination normal.  Psychiatric: He has a normal mood and affect. His behavior is normal. Judgment and thought content normal.  Nursing note and vitals reviewed.   ED Course  Procedures (including critical care time) Labs Review Labs Reviewed  COMPREHENSIVE METABOLIC PANEL - Abnormal; Notable for the following:    Glucose, Bld 179 (*)    ALT 16 (*)    All other components within normal limits  URINALYSIS, ROUTINE W REFLEX MICROSCOPIC (NOT AT [facility]) - Abnormal; Notable for the following:    Glucose, UA 250 (*)    Hgb urine dipstick SMALL (*)    All other components within normal limits  LIPASE, BLOOD  CBC  TROPONIN I  URINE MICROSCOPIC-ADD ON    Imaging  Review Dg Chest 2 View  02/20/2015   CLINICAL DATA:  Chest pain, vomiting, and diarrhea for 3 days.  EXAM: CHEST  2 VIEW  COMPARISON:  11/29/2014  FINDINGS: The heart size and mediastinal contours are within normal limits. Both lungs are clear. The visualized skeletal structures are unremarkable.  IMPRESSION: No active cardiopulmonary disease.   Electronically Signed   By: Lucienne Capers M.D.   On: 02/20/2015 00:02   I have personally reviewed and evaluated these images and lab results as part of my medical decision-making.   EKG Interpretation   Date/Time:  Sunday February 19 2015 18:48:27 EDT Ventricular Rate:  89 PR Interval:  148 QRS Duration: 88 QT Interval:  336 QTC Calculation: 408 R Axis:   90 Text Interpretation:  Normal sinus rhythm Rightward axis Borderline ECG No  significant change was found Confirmed by Wyvonnia Dusky  MD, STEPHEN [id number]) on  02/19/2015 10:24:25 PM      MDM   Final diagnoses:  Viral gastroenteritis    Labs: Urinalysis, lipase, CMP, CBC, i-STAT troponin- no significant findings  Imaging: ED EKG, chest x-ray- no significant findings  Consults:  Therapeutics: Normal saline, Zofran  Discharge Meds: Zofran  Assessment/Plan: Patient presentation most likely consistent with viral gastroenteritis. Patient's chest pain is reproduced with palpation of the chest, repeat exam shows near nonexistent abdominal pain, no chest pain. Patient is nontoxic appearing, vital signs reassuring, laboratory studies reassuring. Patient is tolerating by mouth without difficulty. Patient will be discharged home with Zofran, instructions to follow-up with his primary care provider in 3 days for reevaluation. He is encouraged to return to emergency room if worsening signs or symptoms present. Patient and his wife both verbalized her understanding and agreement for today's plan and had no further questions or concerns at time of discharge.          Okey Regal,  PA-C 02/20/15 0009  Ezequiel Essex, MD 02/20/15 [phone]

## 2015-02-19 NOTE — ED Notes (Addendum)
Pt states diarrhea x several days, last night he began having abdominal pain, vomiting and then began experiencing chest pain.  Wife came back from [place] 2 weeks ago and was experiencing joint pain, headaches, fevers chills, but none of these s/s.

## 2015-02-19 NOTE — ED Notes (Signed)
PT transported to Xray

## 2015-02-20 MED ORDER — ONDANSETRON HCL 4 MG PO TABS: 4.0000 mg | ORAL_TABLET | Freq: Four times a day (QID) | ORAL | Status: DC

## 2015-02-20 NOTE — Discharge Instructions (Signed)
Please use medication as directed, please contact your primary care provider and schedule follow-up evaluation. Please return if new or worsening signs or symptoms present.

## 2015-03-20 ENCOUNTER — Ambulatory Visit: Payer: Self-pay | Admitting: Family Medicine

## 2015-04-02 ENCOUNTER — Emergency Department (HOSPITAL_COMMUNITY): Payer: Self-pay

## 2015-04-02 ENCOUNTER — Encounter (HOSPITAL_COMMUNITY): Payer: Self-pay | Admitting: *Deleted

## 2015-04-02 ENCOUNTER — Observation Stay (HOSPITAL_COMMUNITY)
Admission: EM | Admit: 2015-04-02 | Discharge: 2015-04-04 | Disposition: A | Discharge status: Home / Self Care | Payer: Self-pay | Attending: Family Medicine | Admitting: Family Medicine

## 2015-04-02 DIAGNOSIS — Z7982 Long term (current) use of aspirin: Secondary | ICD-10-CM | POA: Insufficient documentation

## 2015-04-02 DIAGNOSIS — I10 Essential (primary) hypertension: Secondary | ICD-10-CM | POA: Insufficient documentation

## 2015-04-02 DIAGNOSIS — Z79899 Other long term (current) drug therapy: Secondary | ICD-10-CM | POA: Insufficient documentation

## 2015-04-02 DIAGNOSIS — E785 Hyperlipidemia, unspecified: Secondary | ICD-10-CM | POA: Diagnosis present

## 2015-04-02 DIAGNOSIS — Y929 Unspecified place or not applicable: Secondary | ICD-10-CM | POA: Insufficient documentation

## 2015-04-02 DIAGNOSIS — Y939 Activity, unspecified: Secondary | ICD-10-CM | POA: Insufficient documentation

## 2015-04-02 DIAGNOSIS — R208 Other disturbances of skin sensation: Secondary | ICD-10-CM

## 2015-04-02 DIAGNOSIS — R29898 Other symptoms and signs involving the musculoskeletal system: Secondary | ICD-10-CM | POA: Diagnosis present

## 2015-04-02 DIAGNOSIS — I639 Cerebral infarction, unspecified: Principal | ICD-10-CM | POA: Insufficient documentation

## 2015-04-02 DIAGNOSIS — R209 Unspecified disturbances of skin sensation: Secondary | ICD-10-CM

## 2015-04-02 DIAGNOSIS — R072 Precordial pain: Secondary | ICD-10-CM

## 2015-04-02 DIAGNOSIS — E119 Type 2 diabetes mellitus without complications: Secondary | ICD-10-CM | POA: Insufficient documentation

## 2015-04-02 DIAGNOSIS — S60221A Contusion of right hand, initial encounter: Secondary | ICD-10-CM | POA: Insufficient documentation

## 2015-04-02 DIAGNOSIS — X58XXXA Exposure to other specified factors, initial encounter: Secondary | ICD-10-CM | POA: Insufficient documentation

## 2015-04-02 DIAGNOSIS — F3481 Disruptive mood dysregulation disorder: Secondary | ICD-10-CM | POA: Diagnosis present

## 2015-04-02 DIAGNOSIS — Y999 Unspecified external cause status: Secondary | ICD-10-CM | POA: Insufficient documentation

## 2015-04-02 DIAGNOSIS — I2510 Atherosclerotic heart disease of native coronary artery without angina pectoris: Secondary | ICD-10-CM | POA: Insufficient documentation

## 2015-04-02 HISTORY — DX: Transient cerebral ischemic attack, unspecified: G45.9

## 2015-04-02 LAB — RAPID URINE DRUG SCREEN, HOSP PERFORMED
Amphetamines: NOT DETECTED
Barbiturates: NOT DETECTED
Benzodiazepines: NOT DETECTED
Cocaine: NOT DETECTED
Opiates: POSITIVE — AB
Tetrahydrocannabinol: NOT DETECTED

## 2015-04-02 LAB — CBC
HCT: 38.5 % — ABNORMAL LOW (ref 39.0–52.0)
HEMOGLOBIN: 12.5 g/dL — AB (ref 13.0–17.0)
MCH: 29.5 pg (ref 26.0–34.0)
MCHC: 32.5 g/dL (ref 30.0–36.0)
MCV: 90.8 fL (ref 78.0–100.0)
PLATELETS: 279 10*3/uL (ref 150–400)
RBC: 4.24 MIL/uL (ref 4.22–5.81)
RDW: 12.6 % (ref 11.5–15.5)
WBC: 7.3 10*3/uL (ref 4.0–10.5)

## 2015-04-02 LAB — PROTIME-INR
INR: 1.06 (ref 0.00–1.49)
Prothrombin Time: 14 s (ref 11.6–15.2)

## 2015-04-02 LAB — BASIC METABOLIC PANEL
ANION GAP: 7 (ref 5–15)
BUN: 13 mg/dL (ref 6–20)
CHLORIDE: 101 mmol/L (ref 101–111)
CO2: 30 mmol/L (ref 22–32)
CREATININE: 0.87 mg/dL (ref 0.61–1.24)
Calcium: 9.2 mg/dL (ref 8.9–10.3)
GFR calc non Af Amer: >60 mL/min (ref >60)
Glucose, Bld: 148 mg/dL — ABNORMAL HIGH (ref 65–99)
POTASSIUM: 3.7 mmol/L (ref 3.5–5.1)
SODIUM: 138 mmol/L (ref 135–145)

## 2015-04-02 LAB — GLUCOSE, CAPILLARY: Glucose-Capillary: 137 mg/dL — ABNORMAL HIGH (ref 65–99)

## 2015-04-02 LAB — APTT: aPTT: 29 s (ref 24–37)

## 2015-04-02 LAB — I-STAT CHEM 8, ED
BUN: 16 mg/dL (ref 6–20)
Calcium, Ion: 1.26 mmol/L — ABNORMAL HIGH (ref 1.12–1.23)
Chloride: 102 mmol/L (ref 101–111)
Creatinine, Ser: 0.9 mg/dL (ref 0.61–1.24)
Glucose, Bld: 146 mg/dL — ABNORMAL HIGH (ref 65–99)
HCT: 41 % (ref 39.0–52.0)
Hemoglobin: 13.9 g/dL (ref 13.0–17.0)
Potassium: 3.7 mmol/L (ref 3.5–5.1)
Sodium: 144 mmol/L (ref 135–145)
TCO2: 27 mmol/L (ref 0–100)

## 2015-04-02 LAB — TROPONIN I

## 2015-04-02 LAB — URINALYSIS, ROUTINE W REFLEX MICROSCOPIC
Bilirubin Urine: NEGATIVE
Glucose, UA: NEGATIVE mg/dL
Hgb urine dipstick: NEGATIVE
Ketones, ur: 15 mg/dL — AB
Leukocytes, UA: NEGATIVE
Nitrite: NEGATIVE
Protein, ur: 100 mg/dL — AB
Specific Gravity, Urine: 1.019 (ref 1.005–1.030)
Urobilinogen, UA: 1 mg/dL (ref 0.0–1.0)
pH: 6.5 (ref 5.0–8.0)

## 2015-04-02 LAB — ETHANOL: Alcohol, Ethyl (B): <5 mg/dL

## 2015-04-02 LAB — I-STAT TROPONIN, ED: Troponin i, poc: 0 ng/mL (ref 0.00–0.08)

## 2015-04-02 LAB — URINE MICROSCOPIC-ADD ON

## 2015-04-02 MED ORDER — ASPIRIN 325 MG PO TABS
325.0000 mg | ORAL_TABLET | Freq: Every day | ORAL | Status: DC
  Administered 2015-04-03 – 2015-04-04 (×2): 325 mg via ORAL
  Filled 2015-04-02 (×2): qty 1

## 2015-04-02 MED ORDER — INSULIN ASPART 100 UNIT/ML ~~LOC~~ SOLN
0.0000 [IU] | Freq: Three times a day (TID) | SUBCUTANEOUS | Status: DC
  Administered 2015-04-03: 3 [IU] via SUBCUTANEOUS

## 2015-04-02 MED ORDER — VITAMIN D (ERGOCALCIFEROL) 1.25 MG (50000 UNIT) PO CAPS: 50000.0000 [IU] | ORAL_CAPSULE | ORAL | Status: DC

## 2015-04-02 MED ORDER — STROKE: EARLY STAGES OF RECOVERY BOOK
Freq: Once | Status: AC
  Administered 2015-04-03: 05:00:00
  Filled 2015-04-02: qty 1

## 2015-04-02 MED ORDER — INSULIN ASPART 100 UNIT/ML ~~LOC~~ SOLN: 0.0000 [IU] | Freq: Every day | SUBCUTANEOUS | Status: DC

## 2015-04-02 MED ORDER — ONDANSETRON HCL 4 MG/2ML IJ SOLN
4.0000 mg | Freq: Four times a day (QID) | INTRAMUSCULAR | Status: DC | PRN
Start: 2015-04-02 — End: 2015-04-04

## 2015-04-02 MED ORDER — MORPHINE SULFATE (PF) 4 MG/ML IV SOLN
4.0000 mg | Freq: Once | INTRAVENOUS | Status: AC
  Administered 2015-04-02: 4 mg via INTRAVENOUS
  Filled 2015-04-02: qty 1

## 2015-04-02 MED ORDER — ACETAMINOPHEN 650 MG RE SUPP: 650.0000 mg | RECTAL | Status: DC | PRN

## 2015-04-02 MED ORDER — PRAVASTATIN SODIUM 40 MG PO TABS
40.0000 mg | ORAL_TABLET | Freq: Every day | ORAL | Status: DC
  Administered 2015-04-03: 40 mg via ORAL
  Filled 2015-04-02: qty 1

## 2015-04-02 MED ORDER — SENNOSIDES-DOCUSATE SODIUM 8.6-50 MG PO TABS: 1.0000 | ORAL_TABLET | Freq: Every evening | ORAL | Status: DC | PRN

## 2015-04-02 MED ORDER — HYDROCODONE-ACETAMINOPHEN 5-325 MG PO TABS
1.0000 | ORAL_TABLET | Freq: Four times a day (QID) | ORAL | Status: DC | PRN
  Administered 2015-04-02 – 2015-04-03 (×4): 1 via ORAL
  Filled 2015-04-02 (×4): qty 1

## 2015-04-02 MED ORDER — ACETAMINOPHEN 325 MG PO TABS: 650.0000 mg | ORAL_TABLET | ORAL | Status: DC | PRN

## 2015-04-02 MED ORDER — ENOXAPARIN SODIUM 40 MG/0.4ML ~~LOC~~ SOLN
40.0000 mg | SUBCUTANEOUS | Status: DC
  Administered 2015-04-02 – 2015-04-03 (×2): 40 mg via SUBCUTANEOUS
  Filled 2015-04-02 (×2): qty 0.4

## 2015-04-02 MED ORDER — HYDRALAZINE HCL 20 MG/ML IJ SOLN
5.0000 mg | Freq: Four times a day (QID) | INTRAMUSCULAR | Status: DC | PRN
Start: 2015-04-02 — End: 2015-04-04

## 2015-04-02 NOTE — ED Notes (Signed)
Patient transported to MRI 

## 2015-04-02 NOTE — H&P (Signed)
History and Physical  Shane [name] CNO:[id number] DOB: November 15, 1970 DOA: 04/02/2015   PCP: Lorayne Marek, MD  Referring Physician: ED/ S. Dowless, PA-C  Chief Complaint: left face numbness and left arm weakness  HPI:  44 year old male with a history of hypertension, hyperlipidemia, and diabetes mellitus presents with one-day history of numbness of the left side of the face, left arm with associated weakness of the left arm and left leg. The symptoms began around 11 AM on the morning of 04/02/2015. The patient is a poor historian and not forthcoming regarding his history. Much of this history is obtained from speaking with the patient's wife at the bedside. At the time of my evaluation, the patient states that his symptoms have completely resolved. Apparently, the patient developed a bifrontal headache, left greater than right on 03/30/2015 that has progressively worsened. When he developed his numbness and weakness of the left side of his body, he presented to the emergency department. The patient has not had any syncope or dysarthria or word finding difficulty. The patient is unable to tell me the time of onset of his symptoms other than the began on the morning of 04/02/2015. In addition, the patient also developed chest discomfort on the morning of 04/02/2015 that was constant and nonradiating without any relation to exertion.  Again, this also started around 11 AM while he was laying in bed. At the time of my evaluation, the patient was pain-free. There was no shortness of breath, palpitations, dizziness.  In review of the medical records, the patient was admitted to the hospital on 08/26/2014 with almost identical presentation. At that time, the patient is also noted to have hypertensive urgency. He was seen by neurology at that time and deemed to have a TIA secondary to small vessel disease. The patient was discharged with aspirin 81 mg daily.  During this admission, the patient was also noted  to have right hand swelling and bruising. X-ray of his right hand revealed a right fifth metacarpal fracture. Unfortunately, the patient is not forthcoming regarding how the fracture occurred.  He only states that he woke up with right hand swelling on the morning of 04/02/2015.  In the emergency department, the patient was afebrile and hemodynamically stable. BMP and CBC were unremarkable. EKG shows sinus rhythm with nonspecific T-wave changes. INR was 1.06. Chest x-ray was negative. CT of the brain without contrast was negative.  Assessment/Plan: Left upper extremity and left lower extremity weakness with sensory disturbance -Concern about TIA versus stroke -CT brain negative -MRI brain--neg for stroke -LDL -Hemoglobin A1c -11/23/2014 hemoglobin A1c 10.8 -Carotid duplex -PT/ST/OT -Neurology has been consulted by the emergency department -Aspirin 325 mg daily -UDS-positive for opiates only Hypertension -Allow for permissive hypertension in the first 24 hours -Thereafter, plan to restart carvedilol and HCTZ lisinopril, Diabetes mellitus type 2, poorly controlled  -11/23/2014 hemoglobin A1c 10.8 -NovoLog sliding scale -Hold metformin and glipizide -May need to start on insulin Right metacarpal fracture -I spoke with Dr. Burney Gauze (Hand)--place pt in ulnar gutter splint and follow up in his office this week after discharge -Norco prn pain Hyperlipidemia -Continue statin GERD -Continue PPI Atypical chest pain  -Cycle troponins  -EKG with nonspecific T-wave changes  -Continue aspirin  -Hemoglobin A1c and lipids are being drawn         Past Medical History  Diagnosis Date  . Hypertension   . Diabetes mellitus without complication ([place])   . Coronary artery disease   . TIA (transient ischemic  attack)    Past Surgical History  Procedure Laterality Date  . Hernia repair    . Tonsills     . Tonsillectomy     Social History:  reports that he has never smoked. He has never  used smokeless tobacco. He reports that he does not drink alcohol or use illicit drugs.   Family History  Problem Relation Age of Onset  . Hypertension Mother   . Diabetes Mother   . Hyperlipidemia Mother   . Heart disease Mother   . Diabetes Father   . Hypertension Father   . Stroke Father   . Heart disease Father      Allergies  Allergen Reactions  . Eggs Or Egg-Derived Products Diarrhea  . Milk-Related Compounds Diarrhea and Other (See Comments)    ANY DAIRY PRODUCTS-bloating      Prior to Admission medications   Medication Sig Start Date End Date Taking? Authorizing Provider  aspirin EC 81 MG tablet Take 81 mg by mouth daily.   Yes Historical Provider, MD  bismuth subsalicylate (PEPTO BISMOL) 262 MG/15ML suspension Take 30 mLs by mouth every 6 (six) hours as needed for diarrhea or loose stools (nausea).   Yes Historical Provider, MD  carvedilol (COREG) 12.5 MG tablet Take 1 tablet (12.5 mg total) by mouth 2 (two) times daily with a meal. 09/14/14  Yes Deepak Advani, MD  glipiZIDE (GLUCOTROL) 5 MG tablet Take 1 tablet (5 mg total) by mouth 2 (two) times daily before a meal. 11/23/14  Yes Deepak Advani, MD  hydrochlorothiazide (MICROZIDE) 12.5 MG capsule TAKE 1 CAPSULE BY MOUTH DAILY. 01/31/15  Yes Tresa Garter, MD  ibuprofen (ADVIL,MOTRIN) 200 MG tablet Take 400 mg by mouth every 6 (six) hours as needed for moderate pain.   Yes Historical Provider, MD  lisinopril (PRINIVIL,ZESTRIL) 20 MG tablet Take 2 tablets (40 mg total) by mouth daily. 01/31/15  Yes Tresa Garter, MD  metFORMIN (GLUCOPHAGE) 1000 MG tablet Take 1 tablet (1,000 mg total) by mouth 2 (two) times daily with a meal. 09/14/14  Yes Deepak Advani, MD  omeprazole (PRILOSEC) 20 MG capsule Take 1 capsule (20 mg total) by mouth daily. 11/23/14  Yes Deepak Advani, MD  ondansetron (ZOFRAN) 4 MG tablet Take 1 tablet (4 mg total) by mouth every 6 (six) hours. Patient taking differently: Take 4 mg by mouth every 8  (eight) hours as needed for nausea or vomiting.  02/20/15  Yes Okey Regal, PA-C  pravastatin (PRAVACHOL) 40 MG tablet Take 1 tablet (40 mg total) by mouth daily. 08/26/14  Yes Barton Dubois, MD  Vitamin D, Ergocalciferol, (DRISDOL) 50000 UNITS CAPS capsule Take 1 capsule (50,000 Units total) by mouth every 7 (seven) days. Patient taking differently: Take 50,000 Units by mouth every 7 (seven) days. On Sundays 09/23/14  Yes Lorayne Marek, MD  hydrocortisone (ANUSOL-HC) 25 MG suppository Place 1 suppository (25 mg total) rectally 2 (two) times daily. Patient taking differently: Place 25 mg rectally 2 (two) times daily as needed for hemorrhoids.  11/23/14   Lorayne Marek, MD  ondansetron (ZOFRAN ODT) 8 MG disintegrating tablet Take 1 tablet (8 mg total) by mouth every 8 (eight) hours as needed for nausea or vomiting. Patient not taking: Reported on 02/19/2015 11/30/14   Linton Flemings, MD    Review of Systems:  Constitutional:  No weight loss, night sweats, Fevers, chills, fatigue.  Head&Eyes: No headache.  No vision loss.  No eye pain or scotoma ENT:  No Difficulty swallowing,Tooth/dental problems,Sore throat,  No ear ache, post nasal drip,  Cardio-vascular:  No  Orthopnea, PND, swelling in lower extremities,  dizziness, palpitations  GI:  No  abdominal pain, nausea, vomiting, diarrhea, loss of appetite, hematochezia, melena, heartburn, indigestion, Resp:  No  No cough. No coughing up of blood .No wheezing.No chest wall deformity  Skin:  no rash or lesions.  GU:  no dysuria, change in color of urine, no urgency or frequency. No flank pain.  Musculoskeletal:  No joint pain or swelling. No decreased range of motion. No back pain.  Psych:  No change in mood or affect. No depression or anxiety. Neurologic: Complains of a headache, left upper extremity, left face numbness. Also complains of left upper extremity and left lower extremity weakness.   Physical Exam: Filed Vitals:   04/02/15 1715  04/02/15 1730 04/02/15 1845 04/02/15 1901  BP: 154/96 143/90 154/99 173/98  Pulse: 88 85 86 87  Temp:      TempSrc:      Resp: 12 15 16 18   Height:      Weight:      SpO2: 100% 99% 98% 98%   General:  A&O x 3, NAD, nontoxic, pleasant/cooperative Head/Eye: No conjunctival hemorrhage, no icterus, [place]/AT, No nystagmus ENT:  No icterus,  No thrush, good dentition, no pharyngeal exudate Neck:  No masses, no lymphadenpathy, no bruits CV:  RRR, no rub, no gallop, no S3 Lung:  CTAB, good air movement, no wheeze, no rhonchi Abdomen: soft/NT, +BS, nondistended, no peritoneal signs Ext: No cyanosis, No rashes, No petechiae, No lymphangitis, No edema Neuro: CNII-XII intact, strength 4/5 in bilateral upper and lower extremities, no dysmetria  Labs on Admission:  Basic Metabolic Panel:  Recent Labs Lab 04/02/15 1443 04/02/15 1536  NA 138 144  K 3.7 3.7  CL 101 102  CO2 30  --   GLUCOSE 148* 146*  BUN 13 16  CREATININE 0.87 0.90  CALCIUM 9.2  --    Liver Function Tests: No results for input(s): AST, ALT, ALKPHOS, BILITOT, PROT, ALBUMIN in the last 168 hours. No results for input(s): LIPASE, AMYLASE in the last 168 hours. No results for input(s): AMMONIA in the last 168 hours. CBC:  Recent Labs Lab 04/02/15 1443 04/02/15 1536  WBC 7.3  --   HGB 12.5* 13.9  HCT 38.5* 41.0  MCV 90.8  --   PLT 279  --    Cardiac Enzymes: No results for input(s): CKTOTAL, CKMB, CKMBINDEX, TROPONINI in the last 168 hours. BNP: Invalid input(s): POCBNP CBG: No results for input(s): GLUCAP in the last 168 hours.  Radiological Exams on Admission: Dg Chest 2 View  04/02/2015  CLINICAL DATA:  Chest pain for 1 day with swelling of the right hand, lethargy, and drooping left eye EXAM: CHEST  2 VIEW COMPARISON:  02/19/2015 FINDINGS: The heart size and mediastinal contours are within normal limits. Both lungs are clear. The visualized skeletal structures are unremarkable. IMPRESSION: No active  cardiopulmonary disease. Electronically Signed   By: Skipper Cliche M.D.   On: 04/02/2015 15:15   Ct Head Wo Contrast  04/02/2015  CLINICAL DATA:  Left-sided headache.  Possible stroke EXAM: CT HEAD WITHOUT CONTRAST TECHNIQUE: Contiguous axial images were obtained from the base of the skull through the vertex without intravenous contrast. COMPARISON:  CT head 11/30/2014 FINDINGS: Ventricle size is normal. Negative for acute or chronic infarction. Negative for hemorrhage or fluid collection. Negative for mass or edema. No shift of the midline structures. Calvarium is intact. Mild mucosal  edema in the ethmoid sinuses. IMPRESSION: Normal CT of the head.  No acute abnormality. Electronically Signed   By: Franchot Gallo M.D.   On: 04/02/2015 16:41   Mr Brain Wo Contrast  04/02/2015  CLINICAL DATA:  Chest pain last night, woke up this morning with numbness in the left side of body and face. Arm drift. EXAM: MRI HEAD WITHOUT CONTRAST TECHNIQUE: Multiplanar, multiecho pulse sequences of the brain and surrounding structures were obtained without intravenous contrast. COMPARISON:  08/25/2014 FINDINGS: Calvarium and upper cervical spine: No focal marrow signal abnormality. Orbits: No significant findings. Sinuses and Mastoids: Clear. Brain: No acute infarct, hemorrhage, hydrocephalus, or mass lesion. No evidence of large vessel occlusion. Few hyperintense foci in the cerebral white matter are allowable for age; no indication of demyelinating process. Normal cerebral volume. IMPRESSION: Stable and unremarkable brain MRI. Electronically Signed   By: Monte Fantasia M.D.   On: 04/02/2015 18:32   Dg Hand Complete Right  04/02/2015  CLINICAL DATA:  Melinda Crutch to the right hand during a fall the night of 04/01/2015. Pain. Initial encounter. EXAM: RIGHT HAND - COMPLETE 3+ VIEW COMPARISON:  None. FINDINGS: The patient has a fracture through the neck of the fifth metacarpal with mild volar and radial angulation. Soft tissue  swelling is present about the fracture. No other acute bony or joint abnormality is seen. Mild ulnar minus variance is noted. IMPRESSION: Acute fracture neck of the right fifth metacarpal. Electronically Signed   By: Inge Rise M.D.   On: 04/02/2015 16:22    EKG: Independently reviewed. Sinus nonspecific T wave abnormalities    Time spent:60 minutes Code Status:   FULL Family Communication:   Wife updated at bedside   Naiomi Musto, DO  [facility] Pager [phone]  If 7PM-7AM, please contact night-coverage [email or web address] Password [email or web address] 04/02/2015, 7:03 PM

## 2015-04-02 NOTE — ED Notes (Signed)
Attempted report to 3 W. 

## 2015-04-02 NOTE — ED Notes (Signed)
Hospitalist MD at bedside. 

## 2015-04-02 NOTE — ED Provider Notes (Signed)
CSN: [id number]     Arrival date & time 04/02/15  1435 History   First MD Initiated Contact with Patient 04/02/15 1513     Chief Complaint  Patient presents with  . Chest Pain  . Numbness     (Consider location/radiation/quality/duration/timing/severity/associated sxs/prior Treatment) HPI   Shane [name] is a 44 y.o M with a pmhx of TIA in March 2016, CAD, DM, HTN who presents the emergency department stay complaining of headache and left-sided facial numbness since Thursday 4 days ago and chest pain. Patient states that he has been having a severe headache that is remained constant for 4 days ago with associated numbness of the left side of his face. Now having intermittent blurry vision. Patient states today that he woke up with numbness sensation now extending into his left upper and left lower extremity with weakness to the left side as well as chest pain. Pain does not radiate. Patient able to ambulate without difficulty. Denies facial drooping, slurred speech, dizziness, lightheadedness, paresthesias. Denies residual left-sided weakness from previous TIA. Patient states this is all new.  Patient also complained of right hand pain and swelling that began yesterday. Hand is very painful and swollen with extensive bruising. Patient states that he does not recall hitting his hand on anything, new trauma or injury.  Past Medical History  Diagnosis Date  . Hypertension   . Diabetes mellitus without complication ([place])   . Coronary artery disease   . TIA (transient ischemic attack)    Past Surgical History  Procedure Laterality Date  . Hernia repair    . Tonsills     . Tonsillectomy     Family History  Problem Relation Age of Onset  . Hypertension Mother   . Diabetes Mother   . Hyperlipidemia Mother   . Heart disease Mother   . Diabetes Father   . Hypertension Father   . Stroke Father   . Heart disease Father    Social History  Substance Use Topics  . Smoking status: Never  Smoker   . Smokeless tobacco: Never Used  . Alcohol Use: No    Review of Systems  All other systems reviewed and are negative.     Allergies  Eggs or egg-derived products and Milk-related compounds  Home Medications   Prior to Admission medications   Medication Sig Start Date End Date Taking? Authorizing Provider  aspirin EC 81 MG tablet Take 81 mg by mouth daily.   Yes Historical Provider, MD  bismuth subsalicylate (PEPTO BISMOL) 262 MG/15ML suspension Take 30 mLs by mouth every 6 (six) hours as needed for diarrhea or loose stools (nausea).   Yes Historical Provider, MD  carvedilol (COREG) 12.5 MG tablet Take 1 tablet (12.5 mg total) by mouth 2 (two) times daily with a meal. 09/14/14  Yes Deepak Advani, MD  glipiZIDE (GLUCOTROL) 5 MG tablet Take 1 tablet (5 mg total) by mouth 2 (two) times daily before a meal. 11/23/14  Yes Deepak Advani, MD  hydrochlorothiazide (MICROZIDE) 12.5 MG capsule TAKE 1 CAPSULE BY MOUTH DAILY. 01/31/15  Yes Tresa Garter, MD  ibuprofen (ADVIL,MOTRIN) 200 MG tablet Take 400 mg by mouth every 6 (six) hours as needed for moderate pain.   Yes Historical Provider, MD  lisinopril (PRINIVIL,ZESTRIL) 20 MG tablet Take 2 tablets (40 mg total) by mouth daily. 01/31/15  Yes Tresa Garter, MD  metFORMIN (GLUCOPHAGE) 1000 MG tablet Take 1 tablet (1,000 mg total) by mouth 2 (two) times daily with a meal. 09/14/14  Yes Lorayne Marek, MD  omeprazole (PRILOSEC) 20 MG capsule Take 1 capsule (20 mg total) by mouth daily. 11/23/14  Yes Deepak Advani, MD  ondansetron (ZOFRAN) 4 MG tablet Take 1 tablet (4 mg total) by mouth every 6 (six) hours. Patient taking differently: Take 4 mg by mouth every 8 (eight) hours as needed for nausea or vomiting.  02/20/15  Yes Okey Regal, PA-C  pravastatin (PRAVACHOL) 40 MG tablet Take 1 tablet (40 mg total) by mouth daily. 08/26/14  Yes Barton Dubois, MD  Vitamin D, Ergocalciferol, (DRISDOL) 50000 UNITS CAPS capsule Take 1 capsule (50,000  Units total) by mouth every 7 (seven) days. Patient taking differently: Take 50,000 Units by mouth every 7 (seven) days. On Sundays 09/23/14  Yes Lorayne Marek, MD  hydrocortisone (ANUSOL-HC) 25 MG suppository Place 1 suppository (25 mg total) rectally 2 (two) times daily. Patient taking differently: Place 25 mg rectally 2 (two) times daily as needed for hemorrhoids.  11/23/14   Lorayne Marek, MD  ondansetron (ZOFRAN ODT) 8 MG disintegrating tablet Take 1 tablet (8 mg total) by mouth every 8 (eight) hours as needed for nausea or vomiting. Patient not taking: Reported on 02/19/2015 11/30/14   Linton Flemings, MD   BP 173/98 mmHg  Pulse 87  Temp(Src) 98.4 F (36.9 C) (Oral)  Resp 18  Ht 5\' 8"  (1.727 m)  Wt 171 lb (77.565 kg)  BMI 26.01 kg/m2  SpO2 98% Physical Exam  Constitutional: He is oriented to person, place, and time. He appears well-developed and well-nourished. No distress.  HENT:  Head: Normocephalic and atraumatic.  Mouth/Throat: Oropharynx is clear and moist. No oropharyngeal exudate.  Eyes: Conjunctivae and EOM are normal. Pupils are equal, round, and reactive to light. Right eye exhibits no discharge. Left eye exhibits no discharge. No scleral icterus.  Neck: Normal range of motion. Neck supple.  Cardiovascular: Normal rate, regular rhythm, normal heart sounds and intact distal pulses.  Exam reveals no gallop and no friction rub.   No murmur heard. Pulmonary/Chest: Effort normal and breath sounds normal. No respiratory distress. He has no wheezes. He has no rales. He exhibits tenderness.  Abdominal: Soft. He exhibits no distension and no mass. There is no tenderness. There is no rebound and no guarding.  Musculoskeletal: Normal range of motion. He exhibits no edema.  Edema and ecchymosis of dorsal and palmar surface of the later R hand. TTP of 5th metacarpal. No decrease ROM of R digits or R wrist. No evidence of tendon injury.   Lymphadenopathy:    He has no cervical adenopathy.   Neurological: He is alert and oriented to person, place, and time.  Strength 3/5 in left upper and left lower extremity.   Decreased sensation on left side of face.   Positive Pronator drift of left hand.  Skin: Skin is warm and dry. No rash noted. He is not diaphoretic. No erythema. No pallor.  Psychiatric:  Strange affect  Nursing note and vitals reviewed.   ED Course  Procedures (including critical care time) Labs Review Labs Reviewed  BASIC METABOLIC PANEL - Abnormal; Notable for the following:    Glucose, Bld 148 (*)    All other components within normal limits  CBC - Abnormal; Notable for the following:    Hemoglobin 12.5 (*)    HCT 38.5 (*)    All other components within normal limits  URINE RAPID DRUG SCREEN, HOSP PERFORMED - Abnormal; Notable for the following:    Opiates POSITIVE (*)    All  other components within normal limits  URINALYSIS, ROUTINE W REFLEX MICROSCOPIC (NOT AT [facility]) - Abnormal; Notable for the following:    Ketones, ur 15 (*)    Protein, ur 100 (*)    All other components within normal limits  GLUCOSE, CAPILLARY - Abnormal; Notable for the following:    Glucose-Capillary 137 (*)    All other components within normal limits  I-STAT CHEM 8, ED - Abnormal; Notable for the following:    Glucose, Bld 146 (*)    Calcium, Ion 1.26 (*)    All other components within normal limits  ETHANOL  PROTIME-INR  APTT  URINE MICROSCOPIC-ADD ON  TROPONIN I  HEMOGLOBIN A1C  TROPONIN I  TROPONIN I  LIPID PANEL  I-STAT TROPOININ, ED    Imaging Review Dg Chest 2 View  04/02/2015  CLINICAL DATA:  Chest pain for 1 day with swelling of the right hand, lethargy, and drooping left eye EXAM: CHEST  2 VIEW COMPARISON:  02/19/2015 FINDINGS: The heart size and mediastinal contours are within normal limits. Both lungs are clear. The visualized skeletal structures are unremarkable. IMPRESSION: No active cardiopulmonary disease. Electronically Signed   By: Skipper Cliche  M.D.   On: 04/02/2015 15:15   Ct Head Wo Contrast  04/02/2015  CLINICAL DATA:  Left-sided headache.  Possible stroke EXAM: CT HEAD WITHOUT CONTRAST TECHNIQUE: Contiguous axial images were obtained from the base of the skull through the vertex without intravenous contrast. COMPARISON:  CT head 11/30/2014 FINDINGS: Ventricle size is normal. Negative for acute or chronic infarction. Negative for hemorrhage or fluid collection. Negative for mass or edema. No shift of the midline structures. Calvarium is intact. Mild mucosal edema in the ethmoid sinuses. IMPRESSION: Normal CT of the head.  No acute abnormality. Electronically Signed   By: Franchot Gallo M.D.   On: 04/02/2015 16:41   Mr Brain Wo Contrast  04/02/2015  CLINICAL DATA:  Chest pain last night, woke up this morning with numbness in the left side of body and face. Arm drift. EXAM: MRI HEAD WITHOUT CONTRAST TECHNIQUE: Multiplanar, multiecho pulse sequences of the brain and surrounding structures were obtained without intravenous contrast. COMPARISON:  08/25/2014 FINDINGS: Calvarium and upper cervical spine: No focal marrow signal abnormality. Orbits: No significant findings. Sinuses and Mastoids: Clear. Brain: No acute infarct, hemorrhage, hydrocephalus, or mass lesion. No evidence of large vessel occlusion. Few hyperintense foci in the cerebral white matter are allowable for age; no indication of demyelinating process. Normal cerebral volume. IMPRESSION: Stable and unremarkable brain MRI. Electronically Signed   By: Monte Fantasia M.D.   On: 04/02/2015 18:32   Dg Hand Complete Right  04/02/2015  CLINICAL DATA:  Melinda Crutch to the right hand during a fall the night of 04/01/2015. Pain. Initial encounter. EXAM: RIGHT HAND - COMPLETE 3+ VIEW COMPARISON:  None. FINDINGS: The patient has a fracture through the neck of the fifth metacarpal with mild volar and radial angulation. Soft tissue swelling is present about the fracture. No other acute bony or joint  abnormality is seen. Mild ulnar minus variance is noted. IMPRESSION: Acute fracture neck of the right fifth metacarpal. Electronically Signed   By: Inge Rise M.D.   On: 04/02/2015 16:22   I have personally reviewed and evaluated these images and lab results as part of my medical decision-making.   EKG Interpretation   Date/Time:  Sunday April 02 2015 14:43:34 EDT Ventricular Rate:  100 PR Interval:  140 QRS Duration: 98 QT Interval:  334 QTC Calculation:  430 R Axis:   111 Text Interpretation:  Normal sinus rhythm Left posterior fascicular block  Abnormal ECG SINCE LAST TRACING HEART RATE HAS INCREASED inverted T wave  lead III Confirmed by BELFI  MD, MELANIE ([id number]) on 04/02/2015 3:36:08 PM      MDM   Final diagnoses:  Stroke ([place])    Pt with new onset left sided weakness and left sided facial numbness, previous history of TIA in 08/2014. Last known normal was 4 days ago. Not a candidate for TPA. Concern for new stroke. CT head wnl. Spoke with neurology who recommends admission and MRI brain. MRI also negative.   Spoke with hospitalist who will admit pt to their service for further stroke workup.  Pt also c/o chest pain. EKG wnl. Troponin normal. Will hold aspirin until return from CT. Chest pain reproducible on exam. Low suspicion ACS.   Xray right hand reveals acute fracture neck of the right 5th metacarpal. Pt placed in ulnar gutter splint    Carlos Levering, PA-C 04/03/15 0008  Malvin Johns, MD 04/03/15 1430

## 2015-04-02 NOTE — ED Notes (Signed)
Pt is aware urine is needed, urinal at bedside.  

## 2015-04-02 NOTE — ED Notes (Signed)
Patient transported to CT 

## 2015-04-02 NOTE — ED Notes (Addendum)
Pt reports having chest pain last night. This am woke up with numbness sensation to left side of body and face. Also reports headaches since Thursday. Reports numbness sensation to left arm, leg and face. No facial droop noted, +arm drift noted. Pt ambulatory at triage. ekg done.

## 2015-04-02 NOTE — Progress Notes (Signed)
Orthopedic Tech Progress Note Patient Details:  Benz Vandenberghe Jun 13, 1971 [id number]  Ortho Devices Type of Ortho Device: Ace wrap, Ulna gutter splint Ortho Device/Splint Interventions: Application   [place] Pink 04/02/2015, 5:35 PM

## 2015-04-02 NOTE — ED Notes (Signed)
Patient returned from Radiology. 

## 2015-04-02 NOTE — ED Notes (Signed)
Patient returned from MRI.

## 2015-04-03 ENCOUNTER — Observation Stay (HOSPITAL_COMMUNITY): Payer: Self-pay

## 2015-04-03 ENCOUNTER — Observation Stay (HOSPITAL_BASED_OUTPATIENT_CLINIC_OR_DEPARTMENT_OTHER): Payer: Self-pay

## 2015-04-03 DIAGNOSIS — I6789 Other cerebrovascular disease: Secondary | ICD-10-CM

## 2015-04-03 DIAGNOSIS — F3481 Disruptive mood dysregulation disorder: Secondary | ICD-10-CM | POA: Diagnosis present

## 2015-04-03 DIAGNOSIS — R45851 Suicidal ideations: Secondary | ICD-10-CM

## 2015-04-03 LAB — GLUCOSE, CAPILLARY
GLUCOSE-CAPILLARY: 153 mg/dL — AB (ref 65–99)
Glucose-Capillary: 143 mg/dL — ABNORMAL HIGH (ref 65–99)
Glucose-Capillary: 174 mg/dL — ABNORMAL HIGH (ref 65–99)

## 2015-04-03 LAB — LIPID PANEL
CHOLESTEROL: 160 mg/dL (ref 0–200)
HDL: 32 mg/dL — ABNORMAL LOW (ref >40)
LDL Cholesterol: 109 mg/dL — ABNORMAL HIGH (ref 0–99)
Total CHOL/HDL Ratio: 5 RATIO
Triglycerides: 93 mg/dL (ref <150)
VLDL: 19 mg/dL (ref 0–40)

## 2015-04-03 LAB — TROPONIN I

## 2015-04-03 MED ORDER — PANTOPRAZOLE SODIUM 40 MG PO TBEC
40.0000 mg | DELAYED_RELEASE_TABLET | Freq: Every day | ORAL | Status: DC
  Administered 2015-04-03 – 2015-04-04 (×2): 40 mg via ORAL
  Filled 2015-04-03 (×2): qty 1

## 2015-04-03 MED ORDER — METFORMIN HCL 500 MG PO TABS
1000.0000 mg | ORAL_TABLET | Freq: Two times a day (BID) | ORAL | Status: DC
  Administered 2015-04-03 – 2015-04-04 (×2): 1000 mg via ORAL
  Filled 2015-04-03 (×2): qty 2

## 2015-04-03 MED ORDER — LISINOPRIL 40 MG PO TABS
40.0000 mg | ORAL_TABLET | Freq: Every day | ORAL | Status: DC
  Administered 2015-04-03 – 2015-04-04 (×2): 40 mg via ORAL
  Filled 2015-04-03 (×2): qty 1

## 2015-04-03 MED ORDER — DIVALPROEX SODIUM 500 MG PO DR TAB
500.0000 mg | DELAYED_RELEASE_TABLET | Freq: Every day | ORAL | Status: DC
  Administered 2015-04-03: 500 mg via ORAL
  Filled 2015-04-03: qty 1

## 2015-04-03 MED ORDER — HYDROCHLOROTHIAZIDE 12.5 MG PO CAPS
12.5000 mg | ORAL_CAPSULE | Freq: Every day | ORAL | Status: DC
  Administered 2015-04-03 – 2015-04-04 (×2): 12.5 mg via ORAL
  Filled 2015-04-03 (×2): qty 1

## 2015-04-03 MED ORDER — CARVEDILOL 12.5 MG PO TABS
12.5000 mg | ORAL_TABLET | Freq: Two times a day (BID) | ORAL | Status: DC
  Administered 2015-04-03 – 2015-04-04 (×2): 12.5 mg via ORAL
  Filled 2015-04-03 (×2): qty 1

## 2015-04-03 NOTE — Progress Notes (Signed)
Echocardiogram 2D Echocardiogram has been performed.  Tresa Res 04/03/2015, 12:04 PM

## 2015-04-03 NOTE — Consult Note (Signed)
Date: 04/03/2015               Patient Name:  Shane [name] MRN: [id number]  DOB: 07/17/1970 Age / Sex: 44 y.o., male   PCP: Lorayne Marek, MD         Requesting Physician: Dr. Nita Sells, MD    Consulting Reason:  Left sided numbness     Chief Complaint:   History of Present Illness: Shane [name] is a 44 year old male with PMH of HTN, HLD, DM type 2, TIA (7 months ago) here with c/o of headache and left face, arm and leg numbness.  His headaches started four days ago and he describes them at intermittent, sharp, beginning at back of head and radiating to front with associated nausea, photophobia and phonophobia.  Initial headache four days ago (Thursday) responded to motrin and he was okay on Friday, however, headache returned on Saturday and he spent most of the day laying down.  The motrin did not help for his headache at that time.  Saturday evening he had a "slight disagreement" with his wife and hit a wall with his right fist.  On Sunday morning he woke up with chest pain and numbness on the left side of his face and a little numbness in his left arm and leg.  He reports generalized weakness since last Thursday but no focal weakness.  He also notes blurred vision but denies loss of vision or double vision.  He notes intermittent tinnitus since his headaches started.  His chest pain and left sided numbness resolved within a few hours.  His headache has been intermittent but improves with pain medication he has been receiving during this admission.  The patient denies personal or family hx of migraine.  He reports similar headache when he was admitted for hypertensive emergency and TIA in March 2016 but no headache hx prior.  He feels his headache was brought on by increasing stresses of his new job and he is concerned that he feels he does not get to spend as much time with his family now.   He denies EtOH or illicit drug use.  He is a never smoker.   BP 110s-170/70-100 this admission;  Hgb A1c 10.8 (11/2014), current A1c is pending; LDL 109, HDL 32, TG 93  Meds: Current Facility-Administered Medications  Medication Dose Route Frequency Provider Last Rate Last Dose  . acetaminophen (TYLENOL) tablet 650 mg  650 mg Oral Q4H PRN Orson Eva, MD       Or  . acetaminophen (TYLENOL) suppository 650 mg  650 mg Rectal Q4H PRN Orson Eva, MD      . aspirin tablet 325 mg  325 mg Oral Daily Orson Eva, MD   325 mg at 04/03/15 0859  . carvedilol (COREG) tablet 12.5 mg  12.5 mg Oral BID WC Nita Sells, MD      . divalproex (DEPAKOTE) DR tablet 500 mg  500 mg Oral QHS Ambrose Finland, MD      . enoxaparin (LOVENOX) injection 40 mg  40 mg Subcutaneous Q24H Orson Eva, MD   40 mg at 04/02/15 2248  . hydrALAZINE (APRESOLINE) injection 5 mg  5 mg Intravenous Q6H PRN Orson Eva, MD      . hydrochlorothiazide (MICROZIDE) capsule 12.5 mg  12.5 mg Oral Daily Nita Sells, MD   12.5 mg at 04/03/15 1417  . HYDROcodone-acetaminophen (NORCO/VICODIN) 5-325 MG per tablet 1 tablet  1 tablet Oral Q6H PRN Orson Eva, MD   1 tablet at  04/03/15 0949  . lisinopril (PRINIVIL,ZESTRIL) tablet 40 mg  40 mg Oral Daily Nita Sells, MD   40 mg at 04/03/15 1417  . metFORMIN (GLUCOPHAGE) tablet 1,000 mg  1,000 mg Oral BID WC Nita Sells, MD      . ondansetron (ZOFRAN) injection 4 mg  4 mg Intravenous Q6H PRN Orson Eva, MD      . pantoprazole (PROTONIX) EC tablet 40 mg  40 mg Oral Daily Nita Sells, MD   40 mg at 04/03/15 1416  . pravastatin (PRAVACHOL) tablet 40 mg  40 mg Oral q1800 Orson Eva, MD      . senna-docusate (Senokot-S) tablet 1 tablet  1 tablet Oral QHS PRN Orson Eva, MD      . Derrill Memo ON 04/09/2015] Vitamin D (Ergocalciferol) (DRISDOL) capsule 50,000 Units  50,000 Units Oral Q7 days Orson Eva, MD        Allergies: Allergies as of 04/02/2015 - Review Complete 04/02/2015  Allergen Reaction Noted  . Eggs or egg-derived products Diarrhea 02/19/2015  . Milk-related  compounds Diarrhea and Other (See Comments) 02/19/2015   Past Medical History  Diagnosis Date  . Hypertension   . Diabetes mellitus without complication ([place])   . Coronary artery disease   . TIA (transient ischemic attack)    Past Surgical History  Procedure Laterality Date  . Hernia repair    . Tonsills     . Tonsillectomy     Family History  Problem Relation Age of Onset  . Hypertension Mother   . Diabetes Mother   . Hyperlipidemia Mother   . Heart disease Mother   . Diabetes Father   . Hypertension Father   . Stroke Father   . Heart disease Father    Social History   Social History  . Marital Status: Unknown    Spouse Name: N/A  . Number of Children: N/A  . Years of Education: N/A   Occupational History  . Not on file.   Social History Main Topics  . Smoking status: Never Smoker   . Smokeless tobacco: Never Used  . Alcohol Use: No  . Drug Use: No  . Sexual Activity: Not on file   Other Topics Concern  . Not on file   Social History Narrative    Review of Systems: General:  He reports unexplained wt loss, change in appetite, chills. HEENT:  Per HPI Cardiac:  He had CP this admission but denies CP on a regular basis; he denies leg swelling, PND or orthopnea Pulmonary:  He denies dyspnea GI:  He reports intermittent sharp abdominal pain, N/V, melena (once monthly) GU:  He reports decreased urinary frequency; he denies dark urine Neuro:  Per HPI; he reports 4 episodes of nocturnal bowel incontinence in the past few months; he denies bladder incontinence, seizure activity or hx  Physical Exam: Blood pressure 135/82, pulse 87, temperature 97.7 F (36.5 C), temperature source Oral, resp. rate 17, height 5\' 8"  (1.727 m), weight 162 lb (73.483 kg), SpO2 99 %.  General: resting in bed in NAD HEENT: EOMI, no scleral icterus Cardiac: RRR, no rubs, murmurs or gallops Pulm: clear to auscultation bilaterally, moving normal volumes of air Abd: soft, nontender,  nondistended, BS present Ext: warm and well perfused, no pedal edema, many healed scabs on B/L shins Psych:  Appropriate mood, affect and behavior  Neurologic Exam:  Mental Status: Alert, oriented, thought content appropriate.  Speech fluent without evidence of aphasia. Able to follow 3 step commands without difficulty.  Cranial  Nerves:  II: Discs flat bilaterally. Visual fields grossly intact.  III/IV/VI: Extraocular movements intact.  Pupils sluggish but reactive bilaterally.  V/VII: Smile symmetric. Facial light touch sensation normal bilaterally.  VIII: Grossly intact.  IX/X: Uvula midline  XI: Bilateral shoulder shrug normal.  XII: Midline tongue extension normal.  Motor:  5/5 bilaterally with normal tone and bulk  Sensory:  Light touch intact throughout, bilaterally  DTRs: 2+ and symmetric throughout  Plantars:  Downgoing bilaterally  Cerebellar: Normal finger-to-nose, normal rapid alternating movements and normal heel-to-shin test.  Normal gait and station.   Lab results: Basic Metabolic Panel:  Recent Labs  04/02/15 1443 04/02/15 1536  NA 138 144  K 3.7 3.7  CL 101 102  CO2 30  --   GLUCOSE 148* 146*  BUN 13 16  CREATININE 0.87 0.90  CALCIUM 9.2  --    CBC:  Recent Labs  04/02/15 1443 04/02/15 1536  WBC 7.3  --   HGB 12.5* 13.9  HCT 38.5* 41.0  MCV 90.8  --   PLT 279  --    Cardiac Enzymes:  Recent Labs  04/02/15 2029 04/03/15 0133 04/03/15 0735  TROPONINI <0.03 <0.03 <0.03   CBG:  Recent Labs  04/02/15 2022 04/03/15 0745 04/03/15 1118  GLUCAP 137* 153* 143*   Fasting Lipid Panel:  Recent Labs  04/03/15 0133  CHOL 160  HDL 32*  LDLCALC 109*  TRIG 93  CHOLHDL 5.0   Coagulation:  Recent Labs  04/02/15 1535  LABPROT 14.0  INR 1.06   Urine Drug Screen: Drugs of Abuse     Component Value Date/Time   LABOPIA POSITIVE* 04/02/2015 1713   COCAINSCRNUR NONE DETECTED 04/02/2015 1713   LABBENZ NONE  DETECTED 04/02/2015 1713   AMPHETMU NONE DETECTED 04/02/2015 1713   THCU NONE DETECTED 04/02/2015 1713   LABBARB NONE DETECTED 04/02/2015 1713    Alcohol Level:  Recent Labs  04/02/15 1534  ETH <5   Urinalysis:  Recent Labs  04/02/15 1713  COLORURINE YELLOW  LABSPEC 1.019  PHURINE 6.5  GLUCOSEU NEGATIVE  HGBUR NEGATIVE  BILIRUBINUR NEGATIVE  KETONESUR 15*  PROTEINUR 100*  UROBILINOGEN 1.0  NITRITE NEGATIVE  LEUKOCYTESUR NEGATIVE   Imaging results:  Dg Chest 2 View  04/02/2015  CLINICAL DATA:  Chest pain for 1 day with swelling of the right hand, lethargy, and drooping left eye EXAM: CHEST  2 VIEW COMPARISON:  02/19/2015 FINDINGS: The heart size and mediastinal contours are within normal limits. Both lungs are clear. The visualized skeletal structures are unremarkable. IMPRESSION: No active cardiopulmonary disease. Electronically Signed   By: Skipper Cliche M.D.   On: 04/02/2015 15:15   Ct Head Wo Contrast  04/02/2015  CLINICAL DATA:  Left-sided headache.  Possible stroke EXAM: CT HEAD WITHOUT CONTRAST TECHNIQUE: Contiguous axial images were obtained from the base of the skull through the vertex without intravenous contrast. COMPARISON:  CT head 11/30/2014 FINDINGS: Ventricle size is normal. Negative for acute or chronic infarction. Negative for hemorrhage or fluid collection. Negative for mass or edema. No shift of the midline structures. Calvarium is intact. Mild mucosal edema in the ethmoid sinuses. IMPRESSION: Normal CT of the head.  No acute abnormality. Electronically Signed   By: Franchot Gallo M.D.   On: 04/02/2015 16:41   Mr Brain Wo Contrast  04/02/2015  CLINICAL DATA:  Chest pain last night, woke up this morning with numbness in the left side of body and face. Arm drift. EXAM: MRI HEAD WITHOUT CONTRAST TECHNIQUE:  Multiplanar, multiecho pulse sequences of the brain and surrounding structures were obtained without intravenous contrast. COMPARISON:  08/25/2014  FINDINGS: Calvarium and upper cervical spine: No focal marrow signal abnormality. Orbits: No significant findings. Sinuses and Mastoids: Clear. Brain: No acute infarct, hemorrhage, hydrocephalus, or mass lesion. No evidence of large vessel occlusion. Few hyperintense foci in the cerebral white matter are allowable for age; no indication of demyelinating process. Normal cerebral volume. IMPRESSION: Stable and unremarkable brain MRI. Electronically Signed   By: Monte Fantasia M.D.   On: 04/02/2015 18:32   Dg Hand Complete Right  04/02/2015  CLINICAL DATA:  Melinda Crutch to the right hand during a fall the night of 04/01/2015. Pain. Initial encounter. EXAM: RIGHT HAND - COMPLETE 3+ VIEW COMPARISON:  None. FINDINGS: The patient has a fracture through the neck of the fifth metacarpal with mild volar and radial angulation. Soft tissue swelling is present about the fracture. No other acute bony or joint abnormality is seen. Mild ulnar minus variance is noted. IMPRESSION: Acute fracture neck of the right fifth metacarpal. Electronically Signed   By: Inge Rise M.D.   On: 04/02/2015 16:22    Assessment, Plan, & Recommendations by Problem: 44 year old male with PMH of HTN, HLD, DM type 2 here with headache, chest pain and left sided numbness.   Left sided numbness: symptoms concerning for possible right brain TIA versus complicated migraine.  Work-up underway, awaiting carotid dopplers.  - would continue 325mg  daily ASA at discharge since he reports compliance with 81mg  prior to admission - would recommend switch to high intensity statin based on ASCVD risk >7.5%  - continue to work on risk factor reduction (HTN, HLD, DM) - follow-up with neurology in 2 months  Nocturnal incontinence:  Intermittent loss of bowel control concerning but no other neurologic deficits on exam to suggest cord compression or need for urgent MRI.  He denies waking up with blood in his mouth (tongue biting) or with muscle aches and no hx  of seizure to suggest seizure.  He is also having intermittent abdominal pain and intermittent N/V.  He is on metformin for DM.  Uncontrolled DM could also cause loss of bowel control but he does not seem to have other DM complications so I think this is less likely.  - recommend outpatient colonoscopy given incontinence and melena - he wound need further work-up to r/o seizure if GI work-up is negative  Headache:  No acute MRI findings.  Likely stress related primary headache.   - continue prn treatment but would avoid opiate given potential for overuse headache and abuse potential in general  Chest pain:  Neg troponin and chest pain has resolved.  Per primary.   Signed: Francesca Oman, DO  [facility] PGY3 on Neurology Service 04/03/2015, 2:57 PM  Patient seen and examined together with Dr Redmond Pulling, [facility] PGY3 and I concur with the assessment and plan.  Dorian Pod, MD

## 2015-04-03 NOTE — Progress Notes (Signed)
Pt suicide precautions dc'd, PER MD order. Pt also states he believes he needs a colonoscopy during hospitalization. Will Let MD know

## 2015-04-03 NOTE — Progress Notes (Addendum)
Occupational Therapy Evaluation Patient Details Name: Shane [name] MRN: [id number] DOB: 05-16-1971 Today's Date: 04/03/2015    History of Present Illness 44 y.o M with a PMH of TIA in March 2016, CAD, DM, HTN who presented to the emergency department complaining of headache and left-sided facial numbness. MRI negative for acute infarct.   Clinical Impression   Pt admitted with above. Pt independent with ADLs, PTA. Feel pt will benefit from acute OT to increase independence prior to d/c. Do not feel pt will need follow up OT upon d/c.    Follow Up Recommendations  No OT follow up    Equipment Recommendations  None recommended by OT    Recommendations for Other Services Other (comment) (social work consult-OT spoke with him)     Precautions / Restrictions Precautions Precaution Comments: no pushing, pulling, lifting with RUE (can use three digits for light tasks).  Restrictions Weight Bearing Restrictions: Yes RUE Weight Bearing: Weight bear through elbow only      Mobility Bed Mobility Overal bed mobility: Modified Independent                Transfers Overall transfer level: Needs assistance   Transfers: Sit to/from Stand Sit to Stand: Supervision         General transfer comment: cue for NWB on Rt hand    Balance      Pt reports his balance feels off-reaching to hold vital sign cart in session. No LOB in session.                                      ADL Overall ADL's : Needs assistance/impaired                  Lower Body Bathing: Supervision; Setup; Standing   Lower Body Dressing: Minimal assistance;Sit to/from stand   Toilet Transfer: Supervision/safety;Ambulation (sit to stand from bed/chair)           Functional mobility during ADLs: Supervision/safety General ADL Comments: Educated on UB dressing technique. Recommended spouse pick up items off of floor at home. Educated on edema management techniques for RUE      Vision Pt wears glasses all the time; reports blurry vision Vision Assessment?: Yes Visual Fields: No apparent deficits   Perception     Praxis      Pertinent Vitals/Pain Pain Assessment: 0-10 Pain Score: 5  Pain Location: right hand Pain Descriptors / Indicators: Sore Pain Intervention(s): Monitored during session     Hand Dominance Right   Extremity/Trunk Assessment Upper Extremity Assessment Upper Extremity Assessment: RUE deficits/detail RUE: Unable to fully assess due to immobilization   Lower Extremity Assessment Lower Extremity Assessment: Defer to PT evaluation;Overall WFL for tasks assessed       Communication Communication Communication: No difficulties   Cognition Arousal/Alertness: Awake/alert Behavior During Therapy: WFL for tasks assessed/performed Overall Cognitive Status: Within Functional Limits for tasks assessed                     General Comments       Exercises  Instructed pt to be moving first three digits.     Shoulder Instructions      Home Living Family/patient expects to be discharged to:: Private residence Living Arrangements: Spouse/significant other;Other (Comment) (step granddaughter) Available Help at Discharge: Family;Available 24 hours/day Type of Home: House Home Access: Stairs to enter [employer] of Steps: 3 Entrance  Stairs-Rails: Right Home Layout: One level     Bathroom Shower/Tub: Teacher, early years/pre: Standard     Home Equipment: None          Prior Functioning/Environment Level of Independence: Independent        Comments: works    OT Diagnosis: Acute pain   OT Problem List: Decreased range of motion;Pain;Impaired UE functional use;Decreased knowledge of precautions;Decreased knowledge of use of DME or AE;Decreased coordination;Impaired vision/perception   OT Treatment/Interventions: Self-care/ADL training;Therapeutic activities;Therapeutic exercise;DME and/or  AE instruction;Visual/perceptual remediation/compensation;Patient/family education;Balance training    OT Goals(Current goals can be found in the care plan section) Acute Rehab OT Goals Patient Stated Goal: not stated OT Goal Formulation: With patient Time For Goal Achievement: 04/10/15 Potential to Achieve Goals: Good ADL Goals Pt Will Perform Lower Body Dressing: Independently;sit to/from stand Pt Will Transfer to Toilet: Independently;ambulating;regular height toilet Pt Will Perform Tub/Shower Transfer: Tub transfer;Independently;ambulating  OT Frequency: Min 2X/week   Barriers to D/C:            Co-evaluation              End of Session Equipment Utilized During Treatment: Gait belt Nurse Communication: Mobility status;Other (comment) (overheard pt asking OT for psych consult)  Activity Tolerance: Patient tolerated treatment well Patient left: in chair;with call bell/phone within reach   Time: 4917-9150 OT Time Calculation (min): 16 min Charges:  OT General Charges $OT Visit: 1 Procedure OT Evaluation $Initial OT Evaluation Tier I: 1 Procedure G-Codes: OT G-codes **NOT FOR INPATIENT CLASS** Functional Assessment Tool Used: clinical judgment Functional Limitation: Self care Self Care Current Status (V6979): At least 1 percent but less than 20 percent impaired, limited or restricted Self Care Goal Status (Y8016): 0 percent impaired, limited or restricted  Shane [name] OTR/L [phone] 04/03/2015, 10:25 AM

## 2015-04-03 NOTE — Progress Notes (Signed)
Shane [name] LKG:[id number] DOB: May 10, 1971 DOA: 04/02/2015 PCP: Lorayne Marek, MD  Brief narrative: 44 y/o ?  Prior to admission to hospital 3/16 Probable small subcortical lacunar TIA  + hypertensive urgency Diabetes mellitus ty 2 hld  prior history of rectal bleeding and scheduled for colonoscop at last admission  recently moved from [place].  Multiple life-altering events-divorce from ex-wife, remarried <1 year, sense of loss as not with his daughter, changed 3 jobs in last year as well  Readmitted 10/16-1 day history of left-sided facial arm and leg numbness Chest discomfort noted Right hand fracture secondary to punching the wall  MRI brain unremarkable for findings   Past medical history-As per Problem list Chart reviewed as below-   Consultants:  none  Procedures:  Mri as below  Ct as below  Echo carotid pending  Antibiotics:     Subjective   Doing fair Episodic L sided numbness is now better No cp/n/v/sob tol diet No recurrence   Objective    Interim History:   Telemetry: sinur brady   Objective: Filed Vitals:   04/03/15 0014 04/03/15 0442 04/03/15 0602 04/03/15 0746  BP: 120/74 149/100 135/82   Pulse: 78 80 87   Temp:  98.5 F (36.9 C)  97.7 F (36.5 C)  TempSrc:  Oral  Oral  Resp: 12 14 17    Height:      Weight:      SpO2: 98% 100% 99%     Intake/Output Summary (Last 24 hours) at 04/03/15 1211 Last data filed at 04/03/15 1121  Gross per 24 hour  Intake    480 ml  Output   1100 ml  Net   -620 ml    Exam:  General: eomi, ncat Cardiovascular:  s1 s 2no m/r/g Respiratory: clear no added sound Abdomen:  Soft nt nd no rebound Skin intact Neuropower 5/5/, reflexes 2/3.  Sensory grossly intact  Data Reviewed: Basic Metabolic Panel:  Recent Labs Lab 04/02/15 1443 04/02/15 1536  NA 138 144  K 3.7 3.7  CL 101 102  CO2 30  --   GLUCOSE 148* 146*  BUN 13 16  CREATININE 0.87 0.90  CALCIUM 9.2  --    Liver  Function Tests: No results for input(s): AST, ALT, ALKPHOS, BILITOT, PROT, ALBUMIN in the last 168 hours. No results for input(s): LIPASE, AMYLASE in the last 168 hours. No results for input(s): AMMONIA in the last 168 hours. CBC:  Recent Labs Lab 04/02/15 1443 04/02/15 1536  WBC 7.3  --   HGB 12.5* 13.9  HCT 38.5* 41.0  MCV 90.8  --   PLT 279  --    Cardiac Enzymes:  Recent Labs Lab 04/02/15 2029 04/03/15 0133 04/03/15 0735  TROPONINI <0.03 <0.03 <0.03   BNP: Invalid input(s): POCBNP CBG:  Recent Labs Lab 04/02/15 2022 04/03/15 0745 04/03/15 1118  GLUCAP 137* 153* 143*    No results found for this or any previous visit (from the past 240 hour(s)).   Studies:              All Imaging reviewed and is as per above notation   Scheduled Meds: . aspirin  325 mg Oral Daily  . carvedilol  12.5 mg Oral BID WC  . divalproex  500 mg Oral QHS  . enoxaparin (LOVENOX) injection  40 mg Subcutaneous Q24H  . hydrochlorothiazide  12.5 mg Oral Daily  . lisinopril  40 mg Oral Daily  . metFORMIN  1,000 mg Oral BID WC  .  pantoprazole  40 mg Oral Daily  . pravastatin  40 mg Oral q1800  . [START ON 04/09/2015] Vitamin D (Ergocalciferol)  50,000 Units Oral Q7 days   Continuous Infusions:    Assessment/Plan:  1. ? TIA vs atypical HA from stress-MRI and Ct neg.  Feels reasonable to hold off on echo and carotids but have already been performed.  Await result.  Risk factor modification--never follwed up with Dr. Leonie Man  As OP.  COtn ASA 325 daily at present 2. Adjustment d/o NOS-multiple life changing events-mentions depression and SUicidality.  Psychn to eval for risk of SI and make recommendations for medications and possible counseling services moving forward.  Probably would not need 1:1 sitter but defer until seen by Psychiatry 3. DM tyII-Hold Glipizide. May use metformin as no risk hypoglycemia. Hold SSI As sugars not above 200.  Await A1c 4. Htn-Resume home meds coreg 12.5  bid, HCTZ 12.5, Lisinopril 40--monitor trends    Verneita Griffes, MD  [facility] Pager [phone] 04/03/2015, 12:11 PM

## 2015-04-03 NOTE — Evaluation (Signed)
Physical Therapy Evaluation Patient Details Name: Shane [name] MRN: [id number] DOB: [birth year] Today's Date: 04/03/2015   History of Present Illness  44 y.o M with a PMH of TIA in March 2016, CAD, DM, HTN who presented to the emergency department complaining of headache and left-sided facial numbness. MRI negative for acute infarct.  Clinical Impression  Patient presents close to functional baseline and able to ambulate community distances and negotiate steps without difficulty. Tolerated higher level balance challenges without deviations in gait. Education re: elevation for RUE and avoiding WB if possible per MD. Pt does not require further skilled therapy services due to above. Discharge from therapy. Recommend follow up with Optomologist due to vision changes/concerns.    Follow Up Recommendations No PT follow up;Supervision - Intermittent    Equipment Recommendations  None recommended by PT    Recommendations for Other Services       Precautions / Restrictions Precautions Precautions: None Precaution Comments: no pushing, pulling, lifting with RUE (can use three digits for light tasks).  Restrictions Weight Bearing Restrictions: Yes RUE Weight Bearing: Weight bear through elbow only      Mobility  Bed Mobility Overal bed mobility: Modified Independent                Transfers Overall transfer level: Needs assistance Equipment used: None Transfers: Sit to/from Stand Sit to Stand: Modified independent (Device/Increase time)         General transfer comment: Cues for NWB through Rt hand upon standing. Transferred to chair post ambulation bout.  Ambulation/Gait Ambulation/Gait assistance: Modified independent (Device/Increase time) Ambulation Distance (Feet): 500 Feet Assistive device: None Gait Pattern/deviations: Step-through pattern   Gait velocity interpretation: at or above normal speed for age/gender General Gait Details: Steady gait. Tolerated higher  level balance challenges without deviations in gait or LOB. VSS. Reports HA.   Stairs Stairs: Yes Stairs assistance: Modified independent (Device/Increase time) Stair Management: One rail Left;Alternating pattern Number of Stairs: 3 General stair comments: Cues for safety and to use LUE on rail and not RUE.  Wheelchair Mobility    Modified Rankin (Stroke Patients Only) Modified Rankin (Stroke Patients Only) Pre-Morbid Rankin Score: No symptoms Modified Rankin: No significant disability     Balance Overall balance assessment: Needs assistance Sitting-balance support: Feet supported;No upper extremity supported Sitting balance-Leahy Scale: Normal     Standing balance support: During functional activity Standing balance-Leahy Scale: Good               High level balance activites: Direction changes;Turns;Sudden stops;Head turns;Backward walking High Level Balance Comments: Tolerated above higher level balance activities without deviations in gait or LOB. Able to step over objects. No LOB.             Pertinent Vitals/Pain Pain Assessment: Faces Faces Pain Scale: Hurts a little bit Pain Location: right hand and HA Pain Descriptors / Indicators: Sore;Headache Pain Intervention(s): Monitored during session    Home Living Family/patient expects to be discharged to:: Private residence Living Arrangements: Spouse/significant other;Other (Comment) Available Help at Discharge: Family;Available 24 hours/day Type of Home: House Home Access: Stairs to enter Entrance Stairs-Rails: Right Entrance Stairs-Number of Steps: 3 Home Layout: One level Home Equipment: None      Prior Function Level of Independence: Independent         Comments: 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 Encouraged follow up with eye doctor.    Exercises        Assessment/Plan    PT Assessment Patent does not need any further PT services  PT Diagnosis Difficulty walking   PT Problem List    PT Treatment Interventions     PT Goals (Current goals can be found in the Care Plan section) Acute Rehab PT Goals Patient Stated Goal: not stated PT Goal Formulation: All assessment and education complete, DC therapy    Frequency     Barriers to discharge        Co-evaluation               End of Session Equipment Utilized During Treatment: Gait belt Activity Tolerance: Patient tolerated treatment well Patient left: in chair;with call bell/phone within reach;with nursing/sitter in room Nurse Communication: Mobility status    Functional Assessment Tool Used: Clinical judgment. Functional Limitation: Mobility: Walking and moving around Mobility: Walking and Moving Around Current Status [phone]): At least 1 percent but less than 20 percent impaired, limited or restricted Mobility: Walking and Moving Around Goal Status [phone]): At least 1 percent but less than 20 percent impaired, limited or restricted Mobility: Walking and Moving Around Discharge Status [phone]): At least 1 percent but less than 20 percent impaired, limited or restricted    Time: 1342-1356 PT Time Calculation (min) (ACUTE ONLY): 14 min   Charges:   PT Evaluation $Initial PT Evaluation Tier I: 1 Procedure     PT G Codes:   PT G-Codes **NOT FOR INPATIENT  CLASS** Functional Assessment Tool Used: Clinical judgment. Functional Limitation: Mobility: Walking and moving around Mobility: Walking and Moving Around Current Status [phone]): At least 1 percent but less than 20 percent impaired, limited or restricted Mobility: Walking and Moving Around Goal Status [phone]): At least 1 percent but less than 20 percent impaired, limited or restricted Mobility: Walking and Moving Around Discharge Status [phone]): At least 1 percent but less than 20 percent impaired, limited or restricted    [facility] 04/03/2015, 2:33 PM Wray Kearns, [place], DPT [phone]

## 2015-04-03 NOTE — Care Management Note (Signed)
Case Management Note  Patient Details  Name: Aadin Gaut MRN: [id number] Date of Birth: 05-03-1971  Subjective/Objective:       Pt admitted for Left arm weakness.              Action/Plan: Psych did f/u with pt. Pt will be d/c on po Depakote. CM did call the [facility] and medication will cost $9.55. Pt uses the [facility] and cost will be $10.00. No further needs from CM at this.   Expected Discharge Date:                  Expected Discharge Plan:  Home/Self Care  In-House Referral:  NA  Discharge planning Services  CM Consult  Post Acute Care Choice:  NA Choice offered to:  NA  DME Arranged:  N/A DME Agency:  NA  HH Arranged:  NA HH Agency:  NA  Status of Service:  Completed, signed off  Medicare Important Message Given:    Date Medicare IM Given:    Medicare IM give by:    Date Additional Medicare IM Given:    Additional Medicare Important Message give by:     If discussed at [place] of Stay Meetings, dates discussed:    Additional Comments:  Bethena Roys, RN 04/03/2015, 3:16 PM

## 2015-04-03 NOTE — Consult Note (Signed)
[facility] Consult   Reason for Consult:  Mood swings and anger out burst Referring Physician:  Dr. Verlon Au Patient Identification: Shane [name] MRN:  [id number] Principal Diagnosis: DMDD (disruptive mood dysregulation disorder) [facility]) Diagnosis:   Patient Active Problem List   Diagnosis Date Noted  . DMDD (disruptive mood dysregulation disorder) ([place]) [F34.81] 04/03/2015  . Left arm weakness [R29.898] 04/02/2015  . Sensory disturbance [R20.8] 04/02/2015  . Essential hypertension [I10] 04/02/2015  . Precordial chest pain [R07.2] 04/02/2015  . 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 HLD (hyperlipidemia) [E78.5]   . Internal carotid artery stenosis [I65.29]   . Hypertensive urgency [I16.0] 08/25/2014  . TIA (transient ischemic attack) [G45.9] 08/25/2014  . DM2 (diabetes mellitus, type 2) ([place]) [E11.9] 08/25/2014  . Headache [R51]   . Left-sided weakness [M62.89]     Total Time spent with patient: 1 hour  Subjective:   Shane [name] is a 44 y.o. male patient admitted with episodic mood swings and anger out burst.  HPI:  Shane [name] is a 44 years old male admitted to [facility] with left arm weakness, sensory disturbance and precordial chest pain. Patient reported he has been suffering with episodic mood swings and anger outbursts after having a argument and verbal altercation with his wife. Patient stated that simple things makes arguments between them which leads to irritability, agitation, aggressive behaviors like punching the wall before came to the hospital. Patient came with right hand swelling and bruising. X-ray of his right hand revealed a right fifth metacarpal fracture. Patient also has multiple healed ulcers on his leg which he reportedly picks on himself when get angry or frustrated. Patient stated his wife is asking him to seek help for mental health but he does not have resources to see a private psychiatrist. Patient reported sometimes  is able to control his anger and other times he does not and his wife walks away from home with his stepdaughter who is 28 years old. Patient was previously married and has a 27 years old daughter lives in [place] with her mother. Patient reportedly. Child support when he can. Patient denies symptoms of depression, mania, psychosis, active suicidal/homicidal ideation, intention or plans. Patient also reported occasionally he thoughts about passive suicidal thoughts like if he is gone his problems will go. Patient has no history of suicidal attempts or acute psychiatric hospitalization. Patient has no previous outpatient medication management. Patient is willing to receive medication management and also outpatient medication treatment and medically discharged from the hospital. 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   Past Psychiatric History: Patient denied history of psychiatric illness, either inpatient or outpatient treatment.  Risk to Self: Is patient at risk for suicide?: No Risk to Others:   Prior Inpatient Therapy:   Prior Outpatient Therapy:    Past Medical History:  Past Medical History  Diagnosis Date  . Hypertension   . Diabetes mellitus without complication ([place])   . Coronary artery disease   . TIA (transient ischemic attack)     Past Surgical History  Procedure Laterality Date  . Hernia repair    . Tonsills     . Tonsillectomy     Family History:  Family History  Problem Relation Age of Onset  . Hypertension Mother   . Diabetes Mother   . Hyperlipidemia Mother   . Heart disease Mother   . Diabetes Father   .  Hypertension Father   . Stroke Father   . Heart disease Father    Family Psychiatric  History: Family history significant for mental illness and his brother who has diagnosis of bipolar disorder but reportedly not taking medication. Social History:  History  Alcohol Use No      History  Drug Use No    Social History   Social History  . Marital Status: Unknown    Spouse Name: N/A  . Number of Children: N/A  . Years of Education: N/A   Social History Main Topics  . Smoking status: Never Smoker   . Smokeless tobacco: Never Used  . Alcohol Use: No  . Drug Use: No  . Sexual Activity: Not Asked   Other Topics Concern  . None   Social History Narrative   Additional Social History: Patient lives with his wife and stepdaughter in [place] and works in [place]. Patient and his family came from [place] about a year ago to have a better life. Patient has denied drug of abuse and also alcoholism.     Allergies:   Allergies  Allergen Reactions  . Eggs Or Egg-Derived Products Diarrhea  . Milk-Related Compounds Diarrhea and Other (See Comments)    ANY DAIRY PRODUCTS-bloating    Labs:  Results for orders placed or performed during the hospital encounter of 04/02/15 (from the past 48 hour(s))  Basic metabolic panel     Status: Abnormal   Collection Time: 04/02/15  2:43 PM  Result Value Ref Range   Sodium 138 135 - 145 mmol/L   Potassium 3.7 3.5 - 5.1 mmol/L   Chloride 101 101 - 111 mmol/L   CO2 30 22 - 32 mmol/L   Glucose, Bld 148 (H) 65 - 99 mg/dL   BUN 13 6 - 20 mg/dL   Creatinine, Ser 0.87 0.61 - 1.24 mg/dL   Calcium 9.2 8.9 - 10.3 mg/dL   GFR calc non Af Amer >60 >60 mL/min   GFR calc Af Amer >60 >60 mL/min    Comment: (NOTE) The eGFR has been calculated using the CKD EPI equation. This calculation has not been validated in all clinical situations. eGFR's persistently <60 mL/min signify possible Chronic Kidney Disease.    Anion gap 7 5 - 15  CBC     Status: Abnormal   Collection Time: 04/02/15  2:43 PM  Result Value Ref Range   WBC 7.3 4.0 - 10.5 K/uL   RBC 4.24 4.22 - 5.81 MIL/uL   Hemoglobin 12.5 (L) 13.0 - 17.0 g/dL   HCT 38.5 (L) 39.0 - 52.0 %   MCV 90.8 78.0 - 100.0 fL   MCH 29.5 26.0 - 34.0 pg   MCHC 32.5 30.0 - 36.0  g/dL   RDW 12.6 11.5 - 15.5 %   Platelets 279 150 - 400 K/uL  I-stat troponin, ED     Status: None   Collection Time: 04/02/15  3:19 PM  Result Value Ref Range   Troponin i, poc 0.00 0.00 - 0.08 ng/mL   Comment 3            Comment: Due to the release kinetics of cTnI, a negative result within the first hours of the onset of symptoms does not rule out myocardial infarction with certainty. If myocardial infarction is still suspected, repeat the test at appropriate intervals.   Ethanol     Status: None   Collection Time: 04/02/15  3:34 PM  Result Value Ref Range   Alcohol, Ethyl (  B) <5 <5 mg/dL    Comment:        LOWEST DETECTABLE LIMIT FOR SERUM ALCOHOL IS 5 mg/dL FOR MEDICAL PURPOSES ONLY   Protime-INR     Status: None   Collection Time: 04/02/15  3:35 PM  Result Value Ref Range   Prothrombin Time 14.0 11.6 - 15.2 seconds   INR 1.06 0.00 - 1.49  APTT     Status: None   Collection Time: 04/02/15  3:35 PM  Result Value Ref Range   aPTT 29 24 - 37 seconds  I-Stat Chem 8, ED  (not at [facility])     Status: Abnormal   Collection Time: 04/02/15  3:36 PM  Result Value Ref Range   Sodium 144 135 - 145 mmol/L   Potassium 3.7 3.5 - 5.1 mmol/L   Chloride 102 101 - 111 mmol/L   BUN 16 6 - 20 mg/dL   Creatinine, Ser 0.90 0.61 - 1.24 mg/dL   Glucose, Bld 146 (H) 65 - 99 mg/dL   Calcium, Ion 1.26 (H) 1.12 - 1.23 mmol/L   TCO2 27 0 - 100 mmol/L   Hemoglobin 13.9 13.0 - 17.0 g/dL   HCT 41.0 39.0 - 52.0 %  Urine rapid drug screen (hosp performed)not at [facility]     Status: Abnormal   Collection Time: 04/02/15  5:13 PM  Result Value Ref Range   Opiates POSITIVE (A) NONE DETECTED   Cocaine NONE DETECTED NONE DETECTED   Benzodiazepines NONE DETECTED NONE DETECTED   Amphetamines NONE DETECTED NONE DETECTED   Tetrahydrocannabinol NONE DETECTED NONE DETECTED   Barbiturates NONE DETECTED NONE DETECTED    Comment:        DRUG SCREEN FOR MEDICAL PURPOSES ONLY.  IF CONFIRMATION IS  NEEDED FOR ANY PURPOSE, NOTIFY LAB WITHIN 5 DAYS.        LOWEST DETECTABLE LIMITS FOR URINE DRUG SCREEN Drug Class       Cutoff (ng/mL) Amphetamine      1000 Barbiturate      200 Benzodiazepine   093 Tricyclics       818 Opiates          300 Cocaine          300 THC              50   Urinalysis, Routine w reflex microscopic (not at [facility])     Status: Abnormal   Collection Time: 04/02/15  5:13 PM  Result Value Ref Range   Color, Urine YELLOW YELLOW   APPearance CLEAR CLEAR   Specific Gravity, Urine 1.019 1.005 - 1.030   pH 6.5 5.0 - 8.0   Glucose, UA NEGATIVE NEGATIVE mg/dL   Hgb urine dipstick NEGATIVE NEGATIVE   Bilirubin Urine NEGATIVE NEGATIVE   Ketones, ur 15 (A) NEGATIVE mg/dL   Protein, ur 100 (A) NEGATIVE mg/dL   Urobilinogen, UA 1.0 0.0 - 1.0 mg/dL   Nitrite NEGATIVE NEGATIVE   Leukocytes, UA NEGATIVE NEGATIVE  Urine microscopic-add on     Status: None   Collection Time: 04/02/15  5:13 PM  Result Value Ref Range   RBC / HPF 0-2 <3 RBC/hpf  Glucose, capillary     Status: Abnormal   Collection Time: 04/02/15  8:22 PM  Result Value Ref Range   Glucose-Capillary 137 (H) 65 - 99 mg/dL  Troponin I (q 6hr x 3)     Status: None   Collection Time: 04/02/15  8:29 PM  Result Value Ref Range   Troponin  I <0.03 <0.031 ng/mL    Comment:        NO INDICATION OF MYOCARDIAL INJURY.   Troponin I (q 6hr x 3)     Status: None   Collection Time: 04/03/15  1:33 AM  Result Value Ref Range   Troponin I <0.03 <0.031 ng/mL    Comment:        NO INDICATION OF MYOCARDIAL INJURY.   Lipid panel     Status: Abnormal   Collection Time: 04/03/15  1:33 AM  Result Value Ref Range   Cholesterol 160 0 - 200 mg/dL   Triglycerides 93 <150 mg/dL   HDL 32 (L) >40 mg/dL   Total CHOL/HDL Ratio 5.0 RATIO   VLDL 19 0 - 40 mg/dL   LDL Cholesterol 109 (H) 0 - 99 mg/dL    Comment:        Total Cholesterol/HDL:CHD Risk Coronary Heart Disease Risk Table                     Men   Women  1/2  Average Risk   3.4   3.3  Average Risk       5.0   4.4  2 X Average Risk   9.6   7.1  3 X Average Risk  23.4   11.0        Use the calculated Patient Ratio above and the CHD Risk Table to determine the patient's CHD Risk.        ATP III CLASSIFICATION (LDL):  <100     mg/dL   Optimal  100-129  mg/dL   Near or Above                    Optimal  130-159  mg/dL   Borderline  160-189  mg/dL   High  >190     mg/dL   Very High   Troponin I (q 6hr x 3)     Status: None   Collection Time: 04/03/15  7:35 AM  Result Value Ref Range   Troponin I <0.03 <0.031 ng/mL    Comment:        NO INDICATION OF MYOCARDIAL INJURY.   Glucose, capillary     Status: Abnormal   Collection Time: 04/03/15  7:45 AM  Result Value Ref Range   Glucose-Capillary 153 (H) 65 - 99 mg/dL   Comment 1 Notify RN    Comment 2 Document in Chart   Glucose, capillary     Status: Abnormal   Collection Time: 04/03/15 11:18 AM  Result Value Ref Range   Glucose-Capillary 143 (H) 65 - 99 mg/dL   Comment 1 Notify RN    Comment 2 Document in Chart     Current Facility-Administered Medications  Medication Dose Route Frequency Provider Last Rate Last Dose  . acetaminophen (TYLENOL) tablet 650 mg  650 mg Oral Q4H PRN Orson Eva, MD       Or  . acetaminophen (TYLENOL) suppository 650 mg  650 mg Rectal Q4H PRN Orson Eva, MD      . aspirin tablet 325 mg  325 mg Oral Daily Orson Eva, MD   325 mg at 04/03/15 0859  . carvedilol (COREG) tablet 12.5 mg  12.5 mg Oral BID WC Nita Sells, MD      . divalproex (DEPAKOTE) DR tablet 500 mg  500 mg Oral QHS Ambrose Finland, MD      . enoxaparin (LOVENOX) injection 40 mg  40 mg Subcutaneous Q24H  Orson Eva, MD   40 mg at 04/02/15 2248  . hydrALAZINE (APRESOLINE) injection 5 mg  5 mg Intravenous Q6H PRN Orson Eva, MD      . hydrochlorothiazide (MICROZIDE) capsule 12.5 mg  12.5 mg Oral Daily Nita Sells, MD      . HYDROcodone-acetaminophen (NORCO/VICODIN) 5-325 MG per  tablet 1 tablet  1 tablet Oral Q6H PRN Orson Eva, MD   1 tablet at 04/03/15 0949  . lisinopril (PRINIVIL,ZESTRIL) tablet 40 mg  40 mg Oral Daily Nita Sells, MD      . metFORMIN (GLUCOPHAGE) tablet 1,000 mg  1,000 mg Oral BID WC Nita Sells, MD      . ondansetron (ZOFRAN) injection 4 mg  4 mg Intravenous Q6H PRN Orson Eva, MD      . pantoprazole (PROTONIX) EC tablet 40 mg  40 mg Oral Daily Nita Sells, MD      . pravastatin (PRAVACHOL) tablet 40 mg  40 mg Oral q1800 Orson Eva, MD      . senna-docusate (Senokot-S) tablet 1 tablet  1 tablet Oral QHS PRN Orson Eva, MD      . Derrill Memo ON 04/09/2015] Vitamin D (Ergocalciferol) (DRISDOL) capsule 50,000 Units  50,000 Units Oral Q7 days Orson Eva, MD        Musculoskeletal: Strength & Muscle Tone: within normal limits Gait & Station: normal Patient leans: N/A  Psychiatric Specialty Exam: ROS left hand pain secondary to fracture.  No Fever-chills, No Headache, No changes with Vision or hearing, reports vertigo No problems swallowing food or Liquids, No Chest pain, Cough or Shortness of Breath, No Abdominal pain, No Nausea or Vommitting, Bowel movements are regular, No Blood in stool or Urine, No dysuria, No new skin rashes or bruises, No new joints pains-aches,  No new weakness, tingling, numbness in any extremity, No recent weight gain or loss, No polyuria, polydypsia or polyphagia,   A full 10 point Review of Systems was done, except as stated above, all other Review of Systems were negative.  Blood pressure 135/82, pulse 87, temperature 97.7 F (36.5 C), temperature source Oral, resp. rate 17, height _0  (1.727 m), weight 73.483 kg (162 lb), SpO2 99 %.Body mass index is 24.64 kg/(m^2).  General Appearance: Casual  Eye Contact::  Good  Speech:  Clear and Coherent  Volume:  Normal  Mood:  Euthymic  Affect:  Appropriate, Congruent and Depressed  Thought Process:  Coherent and Goal Directed  Orientation:  Full  (Time, Place, and Person)  Thought Content:  WDL  Suicidal Thoughts:  Yes.  without intent/plan  Homicidal Thoughts:  No  Memory:  Immediate;   Fair Recent;   Fair  Judgement:  Intact  Insight:  Fair  Psychomotor Activity:  Normal  Concentration:  Good  Recall:  Good  Fund of Knowledge:Good  Language: Good  Akathisia:  Negative  Handed:  Right  AIMS (if indicated):     Assets:  Communication Skills Desire for Improvement Financial Resources/Insurance Housing Intimacy Leisure Time Physical Health Resilience Social Support Talents/Skills Transportation Vocational/Educational  ADL's:  Intact  Cognition: WNL  Sleep:      Treatment Plan Summary: Patient has been suffering with episodic anger outbursts, mood swings arguments mostly once a month with his wife over minute things for differences. Patient has been suffering with diabetes, hypertension and status post TIA and also presented with fracture of right fifth metacarpal secondary to punching the wall. Patient denies current suicidal/homicidal ideation and psychosis and willing to receive medication management  and also for outpatient follow-up.   Daily contact with patient to assess and evaluate symptoms and progress in treatment and Medication management  Disposition: Patient has no safety concerns during this evaluation Will start Depakote DR 500 mg at bedtime which can be increased to thousand milligrams at bedtime as needed for mood swings, and anger outbursts  Monitor for the valproic acid level has outpatient 5 days from now Patient will be referred to the [place] waiting to see his primary care  Patient also will be referred to outpatient psychiatric services at [facility] Patient does not meet criteria for psychiatric inpatient admission. Supportive therapy provided about ongoing stressors. Appreciate psychiatric consultation Please contact [phone] or [phone] if needs  further assistance   Shane [name],JANARDHAHA R. 04/03/2015 1:09 PM

## 2015-04-03 NOTE — Clinical Social Work Note (Addendum)
Clinical Social Work Assessment  Patient Details  Name: Shane [name] MRN: [id number] Date of Birth: [birth year]  Date of referral:  04/03/15               Reason for consult:  Suicide Risk/Attempt, Mental Health Concerns                Permission sought to share information with:    Permission granted to share information::  No  Name::        Agency::     Relationship::     Contact Information:     Housing/Transportation Living arrangements for the past 2 months:  Single Family Home Source of Information:  Patient Patient Interpreter Needed:  None Criminal Activity/Legal Involvement Pertinent to Current Situation/Hospitalization:  No - Comment as needed Significant Relationships:  Spouse Lives with:  Spouse Do you feel safe going back to the place where you live?  Yes Need for family participation in patient care:  No (Coment)  Care giving concerns:  Patient reports that he is concerned about the symptoms he has been having. He states, "I think I am bipolar."   Facilities manager / plan:  CSW met with patient at bedside to complete assessment. Patient states that he and his wife are concerned about the "monthly symptoms" the patient has been having at home. The patient endorses feeling hopeless and insignificant. He feels like he hasn't accomplished anything with his life.He reports that he has had episodes of "deep depression" where he will not get out of bed, but states this doesn't usually last more than a day. When asked about any symptoms of mania the patient states that he does have episodes where he is unable to sleep well for many days. Hi symptoms align with those of depression. The patient states that his wife tells him that he is very "moody" at times. Given the patient's symptoms, CSW assessed the patient for homicidal and suicidal ideation. The patient denies homicidal ideation but he does endorse suicide ideation. The patient does have a plan, but seems to lack means  and intent. He states that he has been thinking about "overdosing on a bunch of pills." When asked if he has access to medication to do this he reports that he does not. CSW has made RN aware of situation and has recommended having psychiatry evaluate patient. CSW did provide patient with outpatient mental health resources. He reports having a supportive wife. Unit CSW signing off at this time as the patient does not appear to have any other CSW related needs.   Employment status:  Unemployed Forensic scientist:  Self Pay (Medicaid Pending) PT Recommendations:  Not assessed at this time Information / Referral to community resources:  Outpatient Psychiatric Care (Comment Required) (Patient has been given outpatient mental health resoureces, but psychiatry will need to be consulted given his current suicidal ideation.)  Patient/Family's Response to care:  Patient was appreciative of visit and is agreeable to psychiatry coming to evaluate him.  Patient/Family's Understanding of and Emotional Response to Diagnosis, Current Treatment, and Prognosis:  Patient appears to have good understanding of reason for admission and understands his post DC needs. He believes he may be "BiPolar".   Emotional Assessment Appearance:  Appears stated age Attitude/Demeanor/Rapport:  Other (Patient was appropriate and forthcoming with information with CSW) Affect (typically observed):  Flat, Calm, Sad Orientation:  Oriented to Self, Oriented to Place, Oriented to  Time, Oriented to Situation Alcohol / Substance use:  Never Used Psych  involvement (Current and /or in the community):  No (Comment)  Discharge Needs  Concerns to be addressed:  Mental Health Concerns Readmission within the last 30 days:  No Current discharge risk:  Other (? mental health concerns) Barriers to Discharge:  Continued Medical Work up   [employer] MSW, [place], [place], [phone]

## 2015-04-04 ENCOUNTER — Encounter: Payer: Self-pay | Admitting: Family Medicine

## 2015-04-04 ENCOUNTER — Encounter (HOSPITAL_COMMUNITY): Payer: Self-pay

## 2015-04-04 LAB — HEMOGLOBIN A1C
Hgb A1c MFr Bld: 9.4 % — ABNORMAL HIGH (ref 4.8–5.6)
Mean Plasma Glucose: 223 mg/dL

## 2015-04-04 LAB — GLUCOSE, CAPILLARY: Glucose-Capillary: 138 mg/dL — ABNORMAL HIGH (ref 65–99)

## 2015-04-04 MED ORDER — ATORVASTATIN CALCIUM 80 MG PO TABS: 80.0000 mg | ORAL_TABLET | Freq: Every day | ORAL | Status: DC

## 2015-04-04 MED ORDER — ASPIRIN 325 MG PO TABS: 325.0000 mg | ORAL_TABLET | Freq: Every day | ORAL | Status: DC

## 2015-04-04 MED ORDER — DIVALPROEX SODIUM 500 MG PO DR TAB: 500.0000 mg | DELAYED_RELEASE_TABLET | Freq: Every day | ORAL | Status: DC

## 2015-04-04 NOTE — Progress Notes (Signed)
STROKE TEAM PROGRESS NOTE   HISTORY [name] is a 44 year old male with PMH of HTN, HLD, DM type 2, TIA (7 months ago) here with c/o of headache and left face, arm and leg numbness. His headaches started four days ago and he describes them at intermittent, sharp, beginning at back of head and radiating to front with associated nausea, photophobia and phonophobia. Initial headache four days ago (Thursday 03/29/2105) responded to motrin and he was okay on Friday, however, headache returned on Saturday and he spent most of the day laying down. The motrin did not help for his headache at that time. Saturday evening he had a "slight disagreement" with his wife and hit a wall with his right fist. On Sunday morning he woke up with chest pain and numbness on the left side of his face and a little numbness in his left arm and leg. He reports generalized weakness since last Thursday but no focal weakness. He also notes blurred vision but denies loss of vision or double vision. He notes intermittent tinnitus since his headaches started. His chest pain and left sided numbness resolved within a few hours. His headache has been intermittent but improves with pain medication he has been receiving during this admission.  The patient denies personal or family hx of migraine. He reports similar headache when he was admitted for hypertensive emergency and TIA in March 2016 but no headache hx prior. He feels his headache was brought on by increasing stresses of his new job and he is concerned that he feels he does not get to spend as much time with his family now. He denies EtOH or illicit drug use. He is a never smoker.   BP 110s-170/70-100 this admission; Hgb A1c 10.8 (11/2014), current A1c is pending; LDL 109, HDL 32, TG 93  Patient was not administered TPA secondary to resolved symptoms. He was admitted  for further evaluation and treatment.   SUBJECTIVE (INTERVAL HISTORY) No family is at the bedside.   Overall he feels his condition is stable.    OBJECTIVE Temp:  [97.7 F (36.5 C)-99.1 F (37.3 C)] 98.1 F (36.7 C) (10/18 0755) Pulse Rate:  [93-94] 93 (10/18 0400) Cardiac Rhythm:  [-] Normal sinus rhythm (10/18 0700) Resp:  [18] 18 (10/18 0400) BP: (145-164)/(94-98) 145/95 mmHg (10/18 0400) SpO2:  [95 %-99 %] 95 % (10/18 0400) Weight:  [74.753 kg (164 lb 12.8 oz)] 74.753 kg (164 lb 12.8 oz) (10/18 0400)  CBC:   Recent Labs Lab 04/02/15 1443 04/02/15 1536  WBC 7.3  --   HGB 12.5* 13.9  HCT 38.5* 41.0  MCV 90.8  --   PLT 279  --     Basic Metabolic Panel:   Recent Labs Lab 04/02/15 1443 04/02/15 1536  NA 138 144  K 3.7 3.7  CL 101 102  CO2 30  --   GLUCOSE 148* 146*  BUN 13 16  CREATININE 0.87 0.90  CALCIUM 9.2  --     Lipid Panel:     Component Value Date/Time   CHOL 160 04/03/2015 0133   TRIG 93 04/03/2015 0133   HDL 32* 04/03/2015 0133   CHOLHDL 5.0 04/03/2015 0133   VLDL 19 04/03/2015 0133   LDLCALC 109* 04/03/2015 0133   HgbA1c:  Lab Results  Component Value Date   HGBA1C 9.4* 04/03/2015   Urine Drug Screen:     Component Value Date/Time   LABOPIA POSITIVE* 04/02/2015 1713   COCAINSCRNUR NONE DETECTED 04/02/2015 1713  LABBENZ NONE DETECTED 04/02/2015 1713   AMPHETMU NONE DETECTED 04/02/2015 1713   THCU NONE DETECTED 04/02/2015 1713   LABBARB NONE DETECTED 04/02/2015 1713      IMAGING  Dg Chest 2 View 04/02/2015  No active cardiopulmonary disease.   Ct Head Wo Contrast 04/02/2015  Normal CT of the head.  No acute abnormality.   Mr Brain Wo Contrast 04/02/2015    Stable and unremarkable brain MRI.   Dg Hand Complete Right 04/02/2015   Acute fracture neck of the right fifth metacarpal.   2D Echocardiogram   - Left ventricle: The cavity size was normal. Systolic function wasnormal. The estimated ejection fraction was in the range of 55%to 60%. Wall motion was normal; there were no regional wallmotion abnormalities. There was  an increased relativecontribution of atrial contraction to ventricular filling.Doppler parameters are consistent with abnormal left ventricularrelaxation (grade 1 diastolic dysfunction). - Aortic valve: Trileaflet; mildly thickened leaflets. - Mitral valve: There was mild regurgitation. - Atrial septum: A patent foramen ovale cannot be excluded. - Pulmonic valve: There was trivial regurgitation. - Recommendations: Agitated saline contrast study to rule out PFO.Recommendations: Agitated saline contrast study to rule out PFO.   PHYSICAL EXAM Pleasant young Hispanic male currently not in distress. Right hand and forearm is in a splint. . Afebrile. Head is nontraumatic. Neck is supple without bruit.    Cardiac exam no murmur or gallop. Lungs are clear to auscultation. Distal pulses are well felt. Neurological Exam : ;  Awake  Alert oriented x 3. Normal speech and language.eye movements full without nystagmus.fundi were not visualized. Vision acuity and fields appear normal. Hearing is normal. Palatal movements are normal. Face symmetric. Tongue midline. Normal strength, tone, reflexes and coordination. Normal sensation. Gait deferred. ASSESSMENT/PLAN [name] is a 44 y.o. male with history of HTN, HLD, DM type 2, TIA (7 months ago) here presenting with headache and left face, arm and leg numbness. He did not receive IV t-PA due to delay in arrival.   TIA vs migraine vs stress   Resultant  Neuro deficits resolved  MRI  No acute stroke  2D EF ok, no SOE, no need from stroke standpoint to do bubble study  No need to repeat normal carotid doppler done in March 2016  LDL 109  HgbA1c 9.4  Lovenox 40 mg sq daily for VTE prophylaxis Diet Carb Modified Fluid consistency:: Thin; Room service appropriate?: Yes Diet - low sodium heart healthy  aspirin 81 mg orally every day prior to admission, now on aspirin 325 mg orally every day. Continue on full dose at discharge  Patient  counseled to be compliant with his antithrombotic medications  Ongoing aggressive stroke risk factor management  Therapy recommendations:  No PT, no OT  Disposition:  Return home. 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 I have made any additions or clarifications directly to the above note. Agree with note above.  He presented with transient left body numbness as well as headache and possible right brain TIA doubt complicated migraine. He remains at risk for neurological worsening, recurrent stroke, TIA needs ongoing stroke evaluation and aggressive risk factor modification. Antony Contras, MD Medical Director [facility] Pager: [phone] 04/04/2015 5:57 PM    To contact Stroke Continuity provider, please refer to [email or web address] After hours, contact General Neurology

## 2015-04-04 NOTE — Discharge Instructions (Signed)
Valproic Acid, Divalproex Sodium delayed or extended-release tablets What is this medicine? DIVALPROEX SODIUM (dye VAL pro ex SO dee um) is used to prevent seizures caused by some forms of epilepsy. It is also used to treat bipolar mania and to prevent migraine headaches. This medicine may be used for other purposes; ask your health care provider or pharmacist if you have questions. What should I tell my health care provider before I take this medicine? They need to know if you have any of these conditions: -blood disease -brain damage or disease -kidney disease -liver disease -low blood proteins -mitochondrial disease -suicidal thoughts, plans, or attempt; a previous suicide attempt by you or a family member -urea cycle disorder (UCD) -an unusual or allergic reaction to divalproex sodium, other medicines, foods, dyes, or preservatives -pregnant or trying to get pregnant -breast-feeding How should I use this medicine? Take this medicine by mouth with a drink of water. Follow the directions on the prescription label. Do not crush or chew. If this medicine upsets your stomach, take it with food or milk. Take your medicine at regular intervals. Do not take it more often than directed. Talk to your pediatrician regarding the use of this medicine in children. Special care may be needed. Overdosage: If you think you have taken too much of this medicine contact a poison control center or emergency room at once. NOTE: This medicine is only for you. Do not share this medicine with others. What if I miss a dose? If you miss a dose, take it as soon as you can. If it is almost time for your next dose, take only that dose. Do not take double or extra doses. What may interact with this medicine? -aspirin -barbiturates, like phenobarbital -diazepam -isoniazid -medicines for depression, anxiety, or psychotic disturbances -medicines that treat or prevent blood clots like warfarin -meropenem -other  seizure medicines -rifampin -tolbutamide -zidovudine This list may not describe all possible interactions. Give your health care provider a list of all the medicines, herbs, non-prescription drugs, or dietary supplements you use. Also tell them if you smoke, drink alcohol, or use illegal drugs. Some items may interact with your medicine. What should I watch for while using this medicine? Visit your doctor or health care professional for regular checks on your progress. If you are taking this medicine to treat epilepsy (seizures), do not stop taking it suddenly. This increases the risk of seizures. Wear a medical identification bracelet or chain to say you have epilepsy or seizures, and carry a card that lists all your medicines. You may get drowsy, dizzy, or have blurred vision. Do not drive, use machinery, or do anything that needs mental alertness until you know how this medicine affects you. To reduce dizzy or fainting spells, do not sit or stand up quickly, especially if you are an older patient. Alcohol can increase drowsiness and dizziness. Avoid alcoholic drinks. This medicine can cause blood problems. This can mean slow healing and a risk of infection. Problems can arise if you need dental work, and in the day to day care of your teeth. Try to avoid damage to your teeth and gums when you brush or floss your teeth. This medicine can make you more sensitive to the sun. Keep out of the sun. If you cannot avoid being in the sun, wear protective clothing and use sunscreen. Do not use sun lamps or tanning beds/booths. The use of this medicine may increase the chance of suicidal thoughts or actions. Pay special attention to  how you are responding while on this medicine. Any worsening of mood, or thoughts of suicide or dying should be reported to your health care professional right away. Women who become pregnant while using this medicine may enroll in the [place] Pregnancy  Registry by calling [phone]. This registry collects information about the safety of antiepileptic drug use during pregnancy. Contact your doctor or healthcare professional if you notice any part of your medicine in your stool. Your healthcare provider may want to check the amount of medicine in your blood if this happens. What side effects may I notice from receiving this medicine? Side effects that you should report to your doctor or health care professional as soon as possible: -allergic reactions like skin rash, itching or hives, swelling of the face, lips, or tongue -changes in the frequency or severity of seizures -double vision or uncontrollable eye movements -nausea and vomiting -redness, blistering, peeling or loosening of the skin, including inside the mouth -stomach pain or cramps -trembling of hands or arms -unusual bleeding or bruising or pinpoint red spots on the skin -unusual swelling of the arms or legs -unusually weak or tired -worsening of mood, thoughts or actions of suicide or dying -yellowing of skin or eyes Side effects that usually do not require medical attention (report to your doctor or health care professional if they continue or are bothersome): -change in menstrual cycle -diarrhea or constipation -headache -loss of bladder control -loss of hair or unusual growth of hair -loss or increase in appetite -weight gain or loss This list may not describe all possible side effects. Call your doctor for medical advice about side effects. You may report side effects to FDA at 1-800-FDA-1088. Where should I keep my medicine? Keep out of reach of children. Store at room temperature between 15 and 30 degrees C (59 and 86 degrees F). Keep container tightly closed. Throw away any unused medicine after the expiration date. NOTE: This sheet is a summary. It may not cover all possible information. If you have questions about this medicine, talk to your doctor, pharmacist,  or health care provider.    2016, [employer]/Gold Standard. (2012-01-28 11:50:42)

## 2015-04-04 NOTE — Discharge Summary (Addendum)
Physician Discharge Summary  Shane [name] SHF:[id number] DOB: 03-06-1971 DOA: 04/02/2015  PCP: Lorayne Marek, MD  Admit date: 04/02/2015 Discharge date: 04/04/2015  Time spent: 45 minutes  Recommendations for Outpatient Follow-up:  1. New meds  2. Aspirin 325 daily 3. Depakoate 500 DR daily--note that depkote level is needed within 5-7 days at pcp office 4. OP follow up with Dr.  Burney Gauze in 1-2 days for follow up of your hand #  Discharge Diagnoses:  Principal Problem:   DMDD (disruptive mood dysregulation disorder) ([place]) Active Problems:   HLD (hyperlipidemia)   Left arm weakness   Sensory disturbance   Essential hypertension   Precordial chest pain   Discharge Condition: fair  Diet recommendation: hh diabetic  Filed Weights   04/02/15 1444 04/02/15 2010 04/04/15 0400  Weight: 77.565 kg (171 lb) 73.483 kg (162 lb) 74.753 kg (164 lb 12.8 oz)    History of present illness:  Brief narrative: 44 y/o ? Prior to admission to hospital 3/16 Probable small subcortical lacunar TIA + hypertensive urgency Diabetes mellitus ty 2 hld  prior history of rectal bleeding and scheduled for colonoscop at last admission  recently moved from [place]. Multiple life-altering events-divorce from ex-wife, remarried <1 year, sense of loss as not with his daughter, changed 3 jobs in last year as well  Readmitted 10/16-1 day history of left-sided facial arm and leg numbness Chest discomfort noted Right hand fracture secondary to punching the wall  MRI brain unremarkable for findings  Hospital Course:    1. ? TIA vs atypical HA from stress-MRI and Ct neg.echo unchanged-carotids penmding at time of d/c. Await result. Risk factor modification--never followed up with Dr. Leonie Man As OP. ASa81--> 325 daily  eper neurology and OP follow up 2. Adjustment d/o NOS with dysruptive mood dysreg disorder multiple life changing events-mentions depression and Suicidality. Psych  Did eval for  risk of SI--Felt he should start Depakote 500 DR.  Needs depakote level ~ 5 days post d/c-routed to PCP for head's up.  Needs OP counselling and S/W in hospital to assess/offer resources  3. DM ty II-A1c 9.6 this admit-initially Held Glipizide. May use metformin as no risk hypoglycemia. Hold SSI As sugars not above 200.WOdl consider initiation Insulin as OP as per PCP  4. Htn-Resume home meds coreg 12.5 bid, HCTZ 12.5, Lisinopril 40--monitor trends 5. HLd-LDL 109-change statin Pravachol-->Atrovastatin 80 this admit 6. Fecal incontinence episodic/Episodic dark stool-note on Senna and Anusol.  not thoguht to be a central process and no recurrence in hospital.  Needs OP GI follow up.  Name of MD for this given.  Hb 13.9 without bleeding on d/c home   Procedures: Mri/ct/echo  Consultations:  Neurology  Discharge Exam: Filed Vitals:   04/04/15 0755  BP:   Pulse:   Temp: 98.1 F (36.7 C)  Resp:     General: eomii ncat Cardiovascular: s1 s 2no m/r/g Respiratory: clea rno added sound Neuro intact No weakness, no numbness  Discharge Instructions  Follow-up Information    Follow up with [facility]. Go in 1 week.   Contact information:   [address] [place] [address] [phone]      Follow up with Schuyler Amor, MD. Go in 2 days.   Specialty:  Orthopedic Surgery   Contact information:   [address] [place] [address] [phone]       Follow up with SETHI,PRAMOD, MD. Schedule an appointment as soon as possible for a visit in 2  months.   Specialties:  Neurology, Radiology   Contact information:   [place] [address] [phone]       Follow up with Beryle Beams, MD. Go in 2 months.   Specialty:  Gastroenterology   Contact information:   [address] [place] [address] [phone]        Discharge Instructions    Diet - low sodium heart healthy     Complete by:  As directed      Discharge instructions    Complete by:  As directed   Main changes to your medications are ASA 325 daily [note change in dose] Addition of Depakote 500 mg-you will need labs to monitor this as an OP. We will refer you to primary care as well as GI and you shodl also follow up in 2 months with Neurology You should also see Orthopedics for your hand issues We will give you a note to excuse you from work for 1 week. You should see Orthopedics to determine what else needs to be done and another note can be given to you for this     Increase activity slowly    Complete by:  As directed           Current Discharge Medication List    START taking these medications   Details  aspirin 325 MG tablet Take 1 tablet (325 mg total) by mouth daily. Qty: 30 tablet, Refills: 0    atorvastatin (LIPITOR) 80 MG tablet Take 1 tablet (80 mg total) by mouth daily. Qty: 30 tablet, Refills: 12    divalproex (DEPAKOTE) 500 MG DR tablet Take 1 tablet (500 mg total) by mouth at bedtime. Qty: 30 tablet, Refills: 0      CONTINUE these medications which have NOT CHANGED   Details  bismuth subsalicylate (PEPTO BISMOL) 262 MG/15ML suspension Take 30 mLs by mouth every 6 (six) hours as needed for diarrhea or loose stools (nausea).    carvedilol (COREG) 12.5 MG tablet Take 1 tablet (12.5 mg total) by mouth 2 (two) times daily with a meal. Qty: 60 tablet, Refills: 3    glipiZIDE (GLUCOTROL) 5 MG tablet Take 1 tablet (5 mg total) by mouth 2 (two) times daily before a meal. Qty: 60 tablet, Refills: 3   Associated Diagnoses: Other specified diabetes mellitus without complications (HCC)    hydrochlorothiazide (MICROZIDE) 12.5 MG capsule TAKE 1 CAPSULE BY MOUTH DAILY. Qty: 30 capsule, Refills: 2    ibuprofen (ADVIL,MOTRIN) 200 MG tablet Take 400 mg by mouth every 6 (six) hours as needed for moderate pain.    lisinopril (PRINIVIL,ZESTRIL) 20 MG tablet Take 2 tablets (40 mg total)  by mouth daily. Qty: 60 tablet, Refills: 1    metFORMIN (GLUCOPHAGE) 1000 MG tablet Take 1 tablet (1,000 mg total) by mouth 2 (two) times daily with a meal. Qty: 180 tablet, Refills: 3    omeprazole (PRILOSEC) 20 MG capsule Take 1 capsule (20 mg total) by mouth daily. Qty: 30 capsule, Refills: 3   Associated Diagnoses: Dyspepsia    Vitamin D, Ergocalciferol, (DRISDOL) 50000 UNITS CAPS capsule Take 1 capsule (50,000 Units total) by mouth every 7 (seven) days. Qty: 12 capsule, Refills: 0    hydrocortisone (ANUSOL-HC) 25 MG suppository Place 1 suppository (25 mg total) rectally 2 (two) times daily. Qty: 12 suppository, Refills: 0   Associated Diagnoses: Hemorrhoids, unspecified hemorrhoid type    ondansetron (ZOFRAN ODT) 8 MG disintegrating tablet Take 1 tablet (8 mg  total) by mouth every 8 (eight) hours as needed for nausea or vomiting. Qty: 20 tablet, Refills: 0      STOP taking these medications     aspirin EC 81 MG tablet      ondansetron (ZOFRAN) 4 MG tablet      pravastatin (PRAVACHOL) 40 MG tablet        Allergies  Allergen Reactions  . Eggs Or Egg-Derived Products Diarrhea  . Milk-Related Compounds Diarrhea and Other (See Comments)    ANY DAIRY PRODUCTS-bloating      The results of significant diagnostics from this hospitalization (including imaging, microbiology, ancillary and laboratory) are listed below for reference.    Significant Diagnostic Studies: Dg Chest 2 View  04/02/2015  CLINICAL DATA:  Chest pain for 1 day with swelling of the right hand, lethargy, and drooping left eye EXAM: CHEST  2 VIEW COMPARISON:  02/19/2015 FINDINGS: The heart size and mediastinal contours are within normal limits. Both lungs are clear. The visualized skeletal structures are unremarkable. IMPRESSION: No active cardiopulmonary disease. Electronically Signed   By: Skipper Cliche M.D.   On: 04/02/2015 15:15   Ct Head Wo Contrast  04/02/2015  CLINICAL DATA:  Left-sided headache.   Possible stroke EXAM: CT HEAD WITHOUT CONTRAST TECHNIQUE: Contiguous axial images were obtained from the base of the skull through the vertex without intravenous contrast. COMPARISON:  CT head 11/30/2014 FINDINGS: Ventricle size is normal. Negative for acute or chronic infarction. Negative for hemorrhage or fluid collection. Negative for mass or edema. No shift of the midline structures. Calvarium is intact. Mild mucosal edema in the ethmoid sinuses. IMPRESSION: Normal CT of the head.  No acute abnormality. Electronically Signed   By: Franchot Gallo M.D.   On: 04/02/2015 16:41   Mr Brain Wo Contrast  04/02/2015  CLINICAL DATA:  Chest pain last night, woke up this morning with numbness in the left side of body and face. Arm drift. EXAM: MRI HEAD WITHOUT CONTRAST TECHNIQUE: Multiplanar, multiecho pulse sequences of the brain and surrounding structures were obtained without intravenous contrast. COMPARISON:  08/25/2014 FINDINGS: Calvarium and upper cervical spine: No focal marrow signal abnormality. Orbits: No significant findings. Sinuses and Mastoids: Clear. Brain: No acute infarct, hemorrhage, hydrocephalus, or mass lesion. No evidence of large vessel occlusion. Few hyperintense foci in the cerebral white matter are allowable for age; no indication of demyelinating process. Normal cerebral volume. IMPRESSION: Stable and unremarkable brain MRI. Electronically Signed   By: Monte Fantasia M.D.   On: 04/02/2015 18:32   Dg Hand Complete Right  04/02/2015  CLINICAL DATA:  Melinda Crutch to the right hand during a fall the night of 04/01/2015. Pain. Initial encounter. EXAM: RIGHT HAND - COMPLETE 3+ VIEW COMPARISON:  None. FINDINGS: The patient has a fracture through the neck of the fifth metacarpal with mild volar and radial angulation. Soft tissue swelling is present about the fracture. No other acute bony or joint abnormality is seen. Mild ulnar minus variance is noted. IMPRESSION: Acute fracture neck of the right fifth  metacarpal. Electronically Signed   By: Inge Rise M.D.   On: 04/02/2015 16:22    Microbiology: No results found for this or any previous visit (from the past 240 hour(s)).   Labs: Basic Metabolic Panel:  Recent Labs Lab 04/02/15 1443 04/02/15 1536  NA 138 144  K 3.7 3.7  CL 101 102  CO2 30  --   GLUCOSE 148* 146*  BUN 13 16  CREATININE 0.87 0.90  CALCIUM 9.2  --  Liver Function Tests: No results for input(s): AST, ALT, ALKPHOS, BILITOT, PROT, ALBUMIN in the last 168 hours. No results for input(s): LIPASE, AMYLASE in the last 168 hours. No results for input(s): AMMONIA in the last 168 hours. CBC:  Recent Labs Lab 04/02/15 1443 04/02/15 1536  WBC 7.3  --   HGB 12.5* 13.9  HCT 38.5* 41.0  MCV 90.8  --   PLT 279  --    Cardiac Enzymes:  Recent Labs Lab 04/02/15 2029 04/03/15 0133 04/03/15 0735  TROPONINI <0.03 <0.03 <0.03   BNP: BNP (last 3 results)  Recent Labs  11/29/14 2252  BNP 39.3    ProBNP (last 3 results) No results for input(s): PROBNP in the last 8760 hours.  CBG:  Recent Labs Lab 04/02/15 2022 04/03/15 0745 04/03/15 1118 04/03/15 2050 04/04/15 0747  GLUCAP 137* 153* 143* 174* 138*       Signed:  Nita Sells  [facility] 04/04/2015, 9:42 AM

## 2015-04-06 ENCOUNTER — Other Ambulatory Visit: Payer: Self-pay | Admitting: Internal Medicine

## 2015-04-07 ENCOUNTER — Ambulatory Visit: Payer: Self-pay | Attending: Family Medicine | Admitting: Family Medicine

## 2015-04-07 ENCOUNTER — Encounter (HOSPITAL_BASED_OUTPATIENT_CLINIC_OR_DEPARTMENT_OTHER): Payer: Self-pay | Admitting: Clinical

## 2015-04-07 ENCOUNTER — Encounter: Payer: Self-pay | Admitting: Family Medicine

## 2015-04-07 VITALS — BP 139/89 | HR 85 | Temp 97.6°F | Resp 16 | Ht 67.0 in | Wt 166.0 lb

## 2015-04-07 DIAGNOSIS — F32A Depression, unspecified: Secondary | ICD-10-CM

## 2015-04-07 DIAGNOSIS — E1149 Type 2 diabetes mellitus with other diabetic neurological complication: Secondary | ICD-10-CM | POA: Insufficient documentation

## 2015-04-07 DIAGNOSIS — Z7984 Long term (current) use of oral hypoglycemic drugs: Secondary | ICD-10-CM | POA: Insufficient documentation

## 2015-04-07 DIAGNOSIS — G459 Transient cerebral ischemic attack, unspecified: Secondary | ICD-10-CM

## 2015-04-07 DIAGNOSIS — F3481 Disruptive mood dysregulation disorder: Secondary | ICD-10-CM | POA: Insufficient documentation

## 2015-04-07 DIAGNOSIS — I10 Essential (primary) hypertension: Secondary | ICD-10-CM | POA: Insufficient documentation

## 2015-04-07 DIAGNOSIS — Z79899 Other long term (current) drug therapy: Secondary | ICD-10-CM | POA: Insufficient documentation

## 2015-04-07 DIAGNOSIS — Z23 Encounter for immunization: Secondary | ICD-10-CM | POA: Insufficient documentation

## 2015-04-07 DIAGNOSIS — F329 Major depressive disorder, single episode, unspecified: Secondary | ICD-10-CM

## 2015-04-07 DIAGNOSIS — Z8673 Personal history of transient ischemic attack (TIA), and cerebral infarction without residual deficits: Secondary | ICD-10-CM | POA: Insufficient documentation

## 2015-04-07 LAB — GLUCOSE, POCT (MANUAL RESULT ENTRY): POC GLUCOSE: 190 mg/dL — AB (ref 70–99)

## 2015-04-07 MED ORDER — ASPIRIN 325 MG PO TABS: 325.0000 mg | ORAL_TABLET | Freq: Every day | ORAL | Status: DC

## 2015-04-07 MED ORDER — SITAGLIPTIN PHOSPHATE 100 MG PO TABS: 100.0000 mg | ORAL_TABLET | Freq: Every day | ORAL | Status: DC

## 2015-04-07 NOTE — Progress Notes (Signed)
ASSESSMENT: Pt currently experiencing symptoms of depression, which is likely related to mood disorder. Pt needs to f/u with PCP, [facility], and establish [place] care with psychiatrist; would benefit from psychoeducation and supportive counseling regarding coping with symptoms of depression.  Stage of Change: contemplative  PLAN: 1. F/U with behavioral health consultant in next PCP visit 2. Psychiatric Medications: Depakote (for mood). 3. Behavioral recommendation(s):   -Go to [facility] walk-in clinic -Consider reading educational materials regarding coping with symptoms of depression -Call Mobile Crisis number 24/7 if needed SUBJECTIVE: Pt. referred by Dr Adrian Blackwater for symptoms of depression:  Pt. reports the following symptoms/concerns: Pt says his wife tells him that she thinks he may have bipolar disorder; his research into the condition leads him to suspect that she is correct. He saw a psychiatrist in the ER, after he had a stroke, and was prescribed Depakote at that time for mood disorder. He says he did not tell his wife that the night prior to the stroke, he took some of his wifes' pills, does not recall name, nor how many he took. He states that at least monthly he has episodes where he is very irritable, and angers very quickly, then immediately feels remorse about his behavior. He does not feel suicidal today, but does want help.  Duration of problem: At least one year Severity: severe  OBJECTIVE: Orientation & Cognition: Oriented x3. Thought processes normal and appropriate to situation. Mood: appropriate. Affect: appropriate Appearance: appropriate Risk of harm to self or others: low risk of harm to self or others today Substance use: none known Assessments administered: PHQ9: 18/ GAD7: 10  Diagnosis: Depression CPT Code: F32.9 -------------------------------------------- Other(s) present in the room: none  Time spent with patient in exam room: 20 minutes

## 2015-04-07 NOTE — Progress Notes (Signed)
Subjective:  Patient ID: Shane [name], male    DOB: 1970-12-18  Age: 44 y.o. MRN: [id number]  CC: Hospitalization Follow-up; Diabetes; and Hypertension   HPI Shane [name] presents for   1. CHRONIC DIABETES  Disease Monitoring  Blood Sugar Ranges: < 200  Polyuria: no   Visual problems: no   Medication Compliance: yes  Medication Side Effects  Hypoglycemia: no   Preventitive Health Care  Eye Exam:   Foot Exam: done today   Diet pattern:   Exercise:   2. CHRONIC HYPERTENSION  Disease Monitoring  Blood pressure range: not checking   Chest pain: no   Dyspnea: no   Claudication: no   Medication compliance: yes  Medication Side Effects  Lightheadedness: no   Urinary frequency: no   Edema: no   Impotence:    3. HFU: patient admitted from 10/16 -10/18 for L sided weakness. R hand fracture following hitting the wall. Also labile mood that was ongoing for months. Neuroimaging was negative. Patient being followed by ortho for R hand fracture. He is on depakote for his labile mood. He is amenable to mental health. He is feeling mellow now.   Social History  Substance Use Topics  . Smoking status: Never Smoker   . Smokeless tobacco: Never Used  . Alcohol Use: No    Outpatient Prescriptions Prior to Visit  Medication Sig Dispense Refill  . aspirin 325 MG tablet Take 1 tablet (325 mg total) by mouth daily. 30 tablet 0  . atorvastatin (LIPITOR) 80 MG tablet Take 1 tablet (80 mg total) by mouth daily. 30 tablet 12  . bismuth subsalicylate (PEPTO BISMOL) 262 MG/15ML suspension Take 30 mLs by mouth every 6 (six) hours as needed for diarrhea or loose stools (nausea).    . carvedilol (COREG) 12.5 MG tablet Take 1 tablet (12.5 mg total) by mouth 2 (two) times daily with a meal. 60 tablet 3  . divalproex (DEPAKOTE) 500 MG DR tablet Take 1 tablet (500 mg total) by mouth at bedtime. 30 tablet 0  . glipiZIDE (GLUCOTROL) 5 MG tablet Take 1 tablet (5 mg total) by mouth 2 (two) times  daily before a meal. 60 tablet 3  . hydrochlorothiazide (MICROZIDE) 12.5 MG capsule TAKE 1 CAPSULE BY MOUTH DAILY. 30 capsule 2  . hydrocortisone (ANUSOL-HC) 25 MG suppository Place 1 suppository (25 mg total) rectally 2 (two) times daily. (Patient taking differently: Place 25 mg rectally 2 (two) times daily as needed for hemorrhoids. ) 12 suppository 0  . ibuprofen (ADVIL,MOTRIN) 200 MG tablet Take 400 mg by mouth every 6 (six) hours as needed for moderate pain.    Marland Kitchen lisinopril (PRINIVIL,ZESTRIL) 20 MG tablet Take 2 tablets (40 mg total) by mouth daily. 60 tablet 1  . metFORMIN (GLUCOPHAGE) 1000 MG tablet Take 1 tablet (1,000 mg total) by mouth 2 (two) times daily with a meal. 180 tablet 3  . omeprazole (PRILOSEC) 20 MG capsule Take 1 capsule (20 mg total) by mouth daily. 30 capsule 3  . ondansetron (ZOFRAN ODT) 8 MG disintegrating tablet Take 1 tablet (8 mg total) by mouth every 8 (eight) hours as needed for nausea or vomiting. (Patient not taking: Reported on 02/19/2015) 20 tablet 0  . Vitamin D, Ergocalciferol, (DRISDOL) 50000 UNITS CAPS capsule Take 1 capsule (50,000 Units total) by mouth every 7 (seven) days. (Patient taking differently: Take 50,000 Units by mouth every 7 (seven) days. On Sundays) 12 capsule 0   No facility-administered medications prior to visit.  ROS Review of Systems  Constitutional: Negative for fever, chills, fatigue and unexpected weight change.  Eyes: Negative for visual disturbance.  Respiratory: Negative for cough and shortness of breath.   Cardiovascular: Negative for chest pain, palpitations and leg swelling.  Gastrointestinal: Negative for nausea, vomiting, abdominal pain, diarrhea, constipation and blood in stool.  Endocrine: Negative for polydipsia, polyphagia and polyuria.  Musculoskeletal: Positive for arthralgias. Negative for myalgias, back pain, gait problem and neck pain.  Skin: Negative for rash.  Allergic/Immunologic: Negative for immunocompromised  state.  Neurological: Positive for numbness (numbness and tingling in feet ).  Hematological: Negative for adenopathy. Does not bruise/bleed easily.  Psychiatric/Behavioral: Negative for suicidal ideas, sleep disturbance and dysphoric mood. The patient is not nervous/anxious.   GAD-7: score of 6. 2-2,3,4.   Objective:  BP 139/89 mmHg  Pulse 85  Temp(Src) 97.6 F (36.4 C) (Oral)  Resp 16  Ht 5\' 7"  (1.702 m)  Wt 166 lb (75.297 kg)  BMI 25.99 kg/m2  SpO2 100%  BP/Weight 04/07/2015 39/08/90 08/18/74  Systolic BP 226 333 545  Diastolic BP 89 95 91  Wt. (Lbs) 166 164.8 -  BMI 25.99 25.06 -                            Physical Exam  Constitutional: He appears well-developed and well-nourished. No distress.  HENT:  Head: Normocephalic and atraumatic.  Neck: Normal range of motion. Neck supple.  Cardiovascular: Normal rate, regular rhythm, normal heart sounds and intact distal pulses.   Pulmonary/Chest: Effort normal and breath sounds normal.  Musculoskeletal: He exhibits no edema.  Neurological: He is alert.  Skin: Skin is warm and dry. No rash noted. No erythema.  Psychiatric: He has a normal mood and affect.     Lab Results  Component Value Date   HGBA1C 9.4* 04/03/2015   CBG 190  Assessment & Plan:   Kathy was seen today for hospitalization follow-up, diabetes and hypertension.  Diagnoses and all orders for this visit:  Type 2 diabetes mellitus with other neurologic complication ([place]) -     POCT glucose (manual entry) -     Microalbumin, urine -     Flu Vaccine QUAD 36+ mos IM -     sitaGLIPtin (JANUVIA) 100 MG tablet; Take 1 tablet (100 mg total) by mouth daily.  Transient cerebral ischemia, unspecified transient cerebral ischemia type -     aspirin 325 MG tablet; Take 1 tablet (325 mg total) by mouth daily.  DMDD (disruptive mood dysregulation disorder) (HCC) -     Valproic acid level; Future     No orders of the defined types were placed in this  encounter.    Follow-up: No Follow-up on file.   Boykin Nearing MD

## 2015-04-07 NOTE — Assessment & Plan Note (Signed)
Referral to mental health Counseling services available at [facility] [place], [place] and [place].   Valproic acid level check early next week

## 2015-04-07 NOTE — Progress Notes (Signed)
HFU DM HTN Fx lt hand Pain scale #7

## 2015-04-07 NOTE — Patient Instructions (Signed)
Shane [name] was seen today for hospitalization follow-up, diabetes and hypertension.  Diagnoses and all orders for this visit:  Type 2 diabetes mellitus with other neurologic complication ([place]) -     POCT glucose (manual entry) -     Microalbumin, urine -     Flu Vaccine QUAD 36+ mos IM -     sitaGLIPtin (JANUVIA) 100 MG tablet; Take 1 tablet (100 mg total) by mouth daily.  Transient cerebral ischemia, unspecified transient cerebral ischemia type -     aspirin 325 MG tablet; Take 1 tablet (325 mg total) by mouth daily.  DMDD (disruptive mood dysregulation disorder) (HCC) -     Valproic acid level; Future  Mental health resources    F/u next week Mon, Tues, Wed for depakote level F/u with me in 6 weeks for mood and diabetes check up   Dr. Adrian Blackwater

## 2015-04-07 NOTE — Assessment & Plan Note (Signed)
Improving A1c down trending  Add januvia 100 mg dialy

## 2015-04-08 LAB — MICROALBUMIN, URINE: MICROALB UR: 9.4 mg/dL

## 2015-04-11 ENCOUNTER — Other Ambulatory Visit: Payer: Self-pay

## 2015-04-12 ENCOUNTER — Encounter (HOSPITAL_BASED_OUTPATIENT_CLINIC_OR_DEPARTMENT_OTHER): Payer: Self-pay | Admitting: Clinical

## 2015-04-12 ENCOUNTER — Other Ambulatory Visit: Payer: Self-pay

## 2015-04-12 ENCOUNTER — Other Ambulatory Visit: Payer: Self-pay | Admitting: *Deleted

## 2015-04-12 ENCOUNTER — Ambulatory Visit: Payer: Self-pay | Attending: Family Medicine

## 2015-04-12 DIAGNOSIS — F3481 Disruptive mood dysregulation disorder: Secondary | ICD-10-CM

## 2015-04-12 DIAGNOSIS — R1013 Epigastric pain: Secondary | ICD-10-CM

## 2015-04-12 MED ORDER — OMEPRAZOLE 20 MG PO CPDR: 20.0000 mg | DELAYED_RELEASE_CAPSULE | Freq: Every day | ORAL | Status: DC

## 2015-04-12 NOTE — Progress Notes (Signed)
ASSESSMENT: Pt currently experiencing decreased symptoms of depression since last visit; he needs to continue [facility] med management at [place], f/u with PCP, and would benefit from f/u with [facility] for supportive counseling and psychoeducation to learn additional coping strategies.  Stage of Change: action  PLAN: 1. Consider F/U with behavioral health consultant in as needed to learn additional coping strategies 2. Psychiatric Medications: Depakote (dosage change since last visit, does not recall dosage) 3. Behavioral recommendation(s):   -Continue to attend [facility] appointments  SUBJECTIVE: Pt. referred by self for update on [facility] care:  Pt. reports the following symptoms/concerns: Pt says he went to [facility], his Depakote dosage was increased, and he has appointments set up for f/u with psychiatrist and therapist; he feels "much more calm" and has no suicidal ideation. He says he came in to thank [place] for helping him, and for recommending him to [facility].  Duration of problem: one week Severity: mild  OBJECTIVE: Orientation & Cognition: Oriented x3. Thought processes normal and appropriate to situation. Mood: appropriate. Affect: appropriate Appearance: appropriate Risk of harm to self or others: no risk of harm to self or others Substance use: none Assessments administered: PHQ9: 4/ GAD7: 0  Diagnosis: DMDD (disruptive mood dysregulation disorder) CPT Code: F34.81 -------------------------------------------- Other(s) present in the room: none  Time spent with patient in exam room: 10 minutes

## 2015-04-13 ENCOUNTER — Ambulatory Visit: Payer: MEDICAID | Attending: Orthopedic Surgery | Admitting: Occupational Therapy

## 2015-04-13 ENCOUNTER — Encounter: Payer: Self-pay | Admitting: Occupational Therapy

## 2015-04-13 DIAGNOSIS — M79641 Pain in right hand: Secondary | ICD-10-CM | POA: Insufficient documentation

## 2015-04-13 DIAGNOSIS — M6289 Other specified disorders of muscle: Secondary | ICD-10-CM | POA: Insufficient documentation

## 2015-04-13 DIAGNOSIS — M25641 Stiffness of right hand, not elsewhere classified: Secondary | ICD-10-CM | POA: Insufficient documentation

## 2015-04-13 DIAGNOSIS — R29898 Other symptoms and signs involving the musculoskeletal system: Secondary | ICD-10-CM

## 2015-04-13 LAB — VALPROIC ACID LEVEL: Valproic Acid Lvl: 30.4 ug/mL — ABNORMAL LOW (ref 50.0–100.0)

## 2015-04-13 NOTE — Patient Instructions (Signed)
SPLINT WEAR AND CARE   WEARING SCHEDULE:  Wear splint at ALL times except for hygiene care   PURPOSE:  To prevent movement and for protection until injury can heal  CARE OF SPLINT:  Keep splint away from heat sources including: stove, radiator or furnace, or a car in sunlight. The splint can melt and will no longer fit you properly  Keep away from pets and children  Clean the splint with rubbing alcohol 1-2 times per day.  * During this time, make sure you also clean your hand/arm as instructed by your therapist and/or perform dressing changes as needed. Then dry hand/arm completely before replacing splint. (When cleaning hand/arm, keep it immobilized in same position until splint is replaced)  PRECAUTIONS/POTENTIAL PROBLEMS: *If you notice or experience increased pain, swelling, numbness, or a lingering reddened area from the splint: Contact your therapist immediately by calling [phone]. You must wear the splint for protection, but we will get you scheduled for adjustments as quickly as possible.  (If only straps or hooks need to be replaced and NO adjustments to the splint need to be made, just call the office ahead and let them know you are coming in)  If you have any medical concerns or signs of infection, please call your doctor immediately    

## 2015-04-13 NOTE — Therapy (Signed)
[facility] [address] [place], [place], [address] Phone: [phone]   Fax:  [phone]  Occupational Therapy Evaluation  Patient Details  Name: Shane [name] MRN: [id number] Date of Birth: 02-24-1971 Referring Provider: Dr. Charlotte Crumb  Encounter Date: 04/13/2015      OT End of Session - 04/13/15 1306    Visit Number 1   Number of Visits 8   Authorization Type self pay   OT Start Time 0800   OT Stop Time 0900   OT Time Calculation (min) 60 min   Activity Tolerance Patient tolerated treatment well      Past Medical History  Diagnosis Date  . Hypertension   . Diabetes mellitus without complication ([place])   . Coronary artery disease   . TIA (transient ischemic attack)     Past Surgical History  Procedure Laterality Date  . Hernia repair    . Tonsills     . Tonsillectomy      There were no vitals filed for this visit.  Visit Diagnosis:  Pain of right hand - Plan: Ot plan of care cert/re-cert  Stiffness of joint, hand, right - Plan: Ot plan of care cert/re-cert  Weakness of right hand - Plan: Ot plan of care cert/re-cert      Subjective Assessment - 04/13/15 0806    Subjective  I didn't realize it was broken at first   Pertinent History see epic snapshot   Patient Stated Goals get back to work and use of my Rt dominant hand   Currently in Pain? Yes   Pain Score 7    Pain Location Hand   Pain Orientation Right   Pain Descriptors / Indicators Sharp   Pain Type Acute pain   Pain Onset 1 to 4 weeks ago   Pain Frequency Intermittent   Aggravating Factors  accidentally hitting it, sleeping   Pain Relieving Factors OTC meds, rest           [facility] OT Assessment - 04/13/15 0001    Assessment   Diagnosis Rt small metacarpal fracture   Referring Provider Dr. Charlotte Crumb   Onset Date 04/01/15  NO SURGERY   Assessment Pt arrived protected in soft cast and ace wrap, but pt reports he  has taken off ace wrap prior to appointment today   Prior Therapy none   Precautions   Precautions Other (comment)   Precaution Comments splint on at all times (except hygiene care)   Required Braces or Orthoses Other Brace/Splint   Other Brace/Splint clamshell splint with MP's and PIP's in 30 degrees flexion (ring and small finger) per MD orders.    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 Cleaned and dried hand prior to splint fabrication. Reviewed precautions (motions  to avoid, no lifting, keeping involved joints immobolized), hygiene care, and splint wear and care   Splinting   Splinting Fabricated and fitted clamshell splint (ulnar gutter style) with MP and PIP joints of 4th and 5th fingers in 30 degrees flexion per MD orders. Issued splint               OT Education - 04/13/15 0858    Education provided Yes   Education Details Splint wear and care, precautions   Person(s) Educated Patient   Methods Explanation;Demonstration;Handout   Comprehension Verbalized understanding;Returned demonstration          OT Short Term Goals - 04/13/15 1309    OT SHORT TERM GOAL #1   Title Independent with splint wear and care - due 05/13/15   Time 4   Period Weeks   Status On-going   OT SHORT TERM GOAL #2   Title Pain less than or equal to 5/10 Rt hand    Baseline 7/10   Time 4   Period Weeks   Status New            OT Long Term Goals - 04/13/15 1310    OT LONG TERM GOAL #1   Title Independent with HEP (when cleared by MD) - due 06/11/15   Time 8   Period Weeks   Status New   OT LONG TERM GOAL #2   Title Pt to return to using Rt hand as dominant hand for BADLS   Time 8   Period Weeks   Status New   OT LONG TERM GOAL #3   Title Pt to demo 90% or greater full composite flexion and extension for grasping and releasing Rt hand    Time 8   Period Weeks   Status New               Plan - 04/13/15 1306    Clinical Impression Statement Pt is a 44 y.o. male who presents to outpatient rehab s/p Rt small metacarpal fx on10/15/16 with no surgery. Pt presents today for splinting purposes   Pt will benefit from skilled therapeutic intervention in order to improve on the following deficits (Retired) Decreased coordination;Decreased range of motion;Increased edema;Impaired sensation;Decreased knowledge of precautions;Impaired UE functional use;Pain;Decreased strength   Rehab Potential Good   OT Frequency 1x / week   OT Duration 8 weeks   OT Treatment/Interventions Self-care/ADL training;Therapeutic exercise;Patient/family education;Splinting;Manual Therapy;Ultrasound;Cryotherapy;Parrafin;DME and/or AE instruction;Compression bandaging;Therapeutic activities;Electrical Stimulation;Fluidtherapy;Scar mobilization;Moist Heat;Passive range of motion   Plan splint adjustments prn, may initiate further therapy when cleared by MD   Consulted and Agree with Plan of Care Patient        Problem List Patient Active Problem List   Diagnosis Date Noted  . DMDD (disruptive mood dysregulation disorder) ([place]) 04/03/2015  . Left arm weakness 04/02/2015  . Sensory disturbance 04/02/2015  . Essential hypertension 04/02/2015  . Hemispheric carotid artery syndrome   . HLD (hyperlipidemia)   . Internal carotid artery stenosis   . TIA (transient ischemic attack) 08/25/2014  . DM2 (diabetes mellitus, type  2) ([place]) 08/25/2014  . Headache     Carey Bullocks, OTR/L 04/13/2015, 1:18 PM  [facility] [address] [place], [place], [address] Phone: [phone]   Fax:  [phone]  Name: Shane [name] MRN: [id number] Date of Birth: December 27, 1970

## 2015-04-24 ENCOUNTER — Emergency Department (HOSPITAL_COMMUNITY): Payer: Self-pay

## 2015-04-24 ENCOUNTER — Encounter (HOSPITAL_COMMUNITY): Payer: Self-pay | Admitting: Emergency Medicine

## 2015-04-24 ENCOUNTER — Observation Stay (HOSPITAL_COMMUNITY)
Admission: EM | Admit: 2015-04-24 | Discharge: 2015-04-25 | Disposition: A | Discharge status: Home / Self Care | Payer: Self-pay | Attending: Internal Medicine | Admitting: Internal Medicine

## 2015-04-24 DIAGNOSIS — I10 Essential (primary) hypertension: Secondary | ICD-10-CM | POA: Insufficient documentation

## 2015-04-24 DIAGNOSIS — E785 Hyperlipidemia, unspecified: Secondary | ICD-10-CM | POA: Diagnosis present

## 2015-04-24 DIAGNOSIS — F3481 Disruptive mood dysregulation disorder: Secondary | ICD-10-CM | POA: Diagnosis present

## 2015-04-24 DIAGNOSIS — Z7982 Long term (current) use of aspirin: Secondary | ICD-10-CM | POA: Insufficient documentation

## 2015-04-24 DIAGNOSIS — E1149 Type 2 diabetes mellitus with other diabetic neurological complication: Secondary | ICD-10-CM

## 2015-04-24 DIAGNOSIS — R42 Dizziness and giddiness: Secondary | ICD-10-CM | POA: Insufficient documentation

## 2015-04-24 DIAGNOSIS — E119 Type 2 diabetes mellitus without complications: Secondary | ICD-10-CM

## 2015-04-24 DIAGNOSIS — R079 Chest pain, unspecified: Principal | ICD-10-CM | POA: Diagnosis present

## 2015-04-24 DIAGNOSIS — N179 Acute kidney failure, unspecified: Secondary | ICD-10-CM | POA: Diagnosis present

## 2015-04-24 DIAGNOSIS — D649 Anemia, unspecified: Secondary | ICD-10-CM | POA: Insufficient documentation

## 2015-04-24 DIAGNOSIS — H538 Other visual disturbances: Secondary | ICD-10-CM | POA: Insufficient documentation

## 2015-04-24 DIAGNOSIS — Z8673 Personal history of transient ischemic attack (TIA), and cerebral infarction without residual deficits: Secondary | ICD-10-CM | POA: Insufficient documentation

## 2015-04-24 DIAGNOSIS — I2510 Atherosclerotic heart disease of native coronary artery without angina pectoris: Secondary | ICD-10-CM | POA: Insufficient documentation

## 2015-04-24 DIAGNOSIS — Z79899 Other long term (current) drug therapy: Secondary | ICD-10-CM | POA: Insufficient documentation

## 2015-04-24 HISTORY — DX: Type 2 diabetes mellitus without complications: E11.9

## 2015-04-24 HISTORY — DX: Procedure and treatment not carried out because of patient's decision for reasons of belief and group pressure: Z53.1

## 2015-04-24 HISTORY — DX: Chronic diastolic (congestive) heart failure: I50.32

## 2015-04-24 HISTORY — DX: Hyperlipidemia, unspecified: E78.5

## 2015-04-24 HISTORY — DX: Major depressive disorder, single episode, unspecified: F32.9

## 2015-04-24 HISTORY — DX: Anemia, unspecified: D64.9

## 2015-04-24 HISTORY — DX: Reserved for inherently not codable concepts without codable children: IMO0001

## 2015-04-24 HISTORY — DX: Gastro-esophageal reflux disease without esophagitis: K21.9

## 2015-04-24 HISTORY — DX: Depression, unspecified: F32.A

## 2015-04-24 HISTORY — DX: Chest pain, unspecified: R07.9

## 2015-04-24 HISTORY — DX: Bipolar disorder, unspecified: F31.9

## 2015-04-24 LAB — IRON AND TIBC
IRON: 60 ug/dL (ref 45–182)
Saturation Ratios: 19 % (ref 17.9–39.5)
TIBC: 330 ug/dL (ref 250–450)
UIBC: 264 ug/dL

## 2015-04-24 LAB — TROPONIN I
Troponin I: <0.03 ng/mL (ref <0.031)
Troponin I: <0.03 ng/mL (ref <0.031)
Troponin I: <0.03 ng/mL (ref <0.031)
Troponin I: <0.03 ng/mL (ref <0.031)

## 2015-04-24 LAB — BASIC METABOLIC PANEL
ANION GAP: 8 (ref 5–15)
BUN: 28 mg/dL — AB (ref 6–20)
CALCIUM: 8.8 mg/dL — AB (ref 8.9–10.3)
CO2: 25 mmol/L (ref 22–32)
Chloride: 107 mmol/L (ref 101–111)
Creatinine, Ser: 1.76 mg/dL — ABNORMAL HIGH (ref 0.61–1.24)
GFR calc Af Amer: 52 mL/min — ABNORMAL LOW (ref >60)
GFR, EST NON AFRICAN AMERICAN: 45 mL/min — AB (ref >60)
GLUCOSE: 181 mg/dL — AB (ref 65–99)
Potassium: 4 mmol/L (ref 3.5–5.1)
SODIUM: 140 mmol/L (ref 135–145)

## 2015-04-24 LAB — URINE MICROSCOPIC-ADD ON

## 2015-04-24 LAB — POC OCCULT BLOOD, ED: Fecal Occult Bld: NEGATIVE

## 2015-04-24 LAB — GLUCOSE, CAPILLARY
Glucose-Capillary: 92 mg/dL (ref 65–99)
Glucose-Capillary: 138 mg/dL — ABNORMAL HIGH (ref 65–99)

## 2015-04-24 LAB — URINALYSIS, ROUTINE W REFLEX MICROSCOPIC
BILIRUBIN URINE: NEGATIVE
Glucose, UA: NEGATIVE mg/dL
KETONES UR: NEGATIVE mg/dL
Leukocytes, UA: NEGATIVE
NITRITE: NEGATIVE
PROTEIN: NEGATIVE mg/dL
Specific Gravity, Urine: 1.013 (ref 1.005–1.030)
UROBILINOGEN UA: 0.2 mg/dL (ref 0.0–1.0)
pH: 5 (ref 5.0–8.0)

## 2015-04-24 LAB — I-STAT CG4 LACTIC ACID, ED
Lactic Acid, Venous: 1.29 mmol/L (ref 0.5–2.0)
Lactic Acid, Venous: 0.64 mmol/L (ref 0.5–2.0)

## 2015-04-24 LAB — CBC
HCT: 30.2 % — ABNORMAL LOW (ref 39.0–52.0)
HEMATOCRIT: 34.7 % — AB (ref 39.0–52.0)
HEMOGLOBIN: 10.3 g/dL — AB (ref 13.0–17.0)
Hemoglobin: 11.1 g/dL — ABNORMAL LOW (ref 13.0–17.0)
MCH: 30.8 pg (ref 26.0–34.0)
MCH: 29.2 pg (ref 26.0–34.0)
MCHC: 34.1 g/dL (ref 30.0–36.0)
MCHC: 32 g/dL (ref 30.0–36.0)
MCV: 90.4 fL (ref 78.0–100.0)
MCV: 91.3 fL (ref 78.0–100.0)
Platelets: 193 10*3/uL (ref 150–400)
Platelets: 236 10*3/uL (ref 150–400)
RBC: 3.34 MIL/uL — ABNORMAL LOW (ref 4.22–5.81)
RBC: 3.8 MIL/uL — ABNORMAL LOW (ref 4.22–5.81)
RDW: 12.6 % (ref 11.5–15.5)
RDW: 12.7 % (ref 11.5–15.5)
WBC: 9.6 10*3/uL (ref 4.0–10.5)
WBC: 8 10*3/uL (ref 4.0–10.5)

## 2015-04-24 LAB — FOLATE: Folate: 19.6 ng/mL (ref >5.9)

## 2015-04-24 LAB — CREATININE, SERUM
Creatinine, Ser: 1.28 mg/dL — ABNORMAL HIGH (ref 0.61–1.24)
GFR calc Af Amer: >60 mL/min (ref >60)
GFR calc non Af Amer: >60 mL/min (ref >60)

## 2015-04-24 LAB — RETICULOCYTES
RBC.: 3.8 MIL/uL — ABNORMAL LOW (ref 4.22–5.81)
Retic Count, Absolute: 22.8 10*3/uL (ref 19.0–186.0)
Retic Ct Pct: 0.6 % (ref 0.4–3.1)

## 2015-04-24 LAB — I-STAT TROPONIN, ED: TROPONIN I, POC: 0 ng/mL (ref 0.00–0.08)

## 2015-04-24 LAB — VALPROIC ACID LEVEL: VALPROIC ACID LVL: 19 ug/mL — AB (ref 50.0–100.0)

## 2015-04-24 LAB — VITAMIN B12: Vitamin B-12: 431 pg/mL (ref 180–914)

## 2015-04-24 LAB — FERRITIN: FERRITIN: 373 ng/mL — AB (ref 24–336)

## 2015-04-24 MED ORDER — HEPARIN SODIUM (PORCINE) 5000 UNIT/ML IJ SOLN
5000.0000 [IU] | Freq: Three times a day (TID) | INTRAMUSCULAR | Status: DC
  Administered 2015-04-24 – 2015-04-25 (×2): 5000 [IU] via SUBCUTANEOUS
  Filled 2015-04-24 (×2): qty 1

## 2015-04-24 MED ORDER — SODIUM CHLORIDE 0.9 % IV SOLN
INTRAVENOUS | Status: DC
  Administered 2015-04-24: 16:00:00 via INTRAVENOUS

## 2015-04-24 MED ORDER — PANTOPRAZOLE SODIUM 40 MG IV SOLR
40.0000 mg | INTRAVENOUS | Status: DC
  Administered 2015-04-24: 40 mg via INTRAVENOUS
  Filled 2015-04-24: qty 40

## 2015-04-24 MED ORDER — INSULIN ASPART 100 UNIT/ML ~~LOC~~ SOLN: 0.0000 [IU] | Freq: Every day | SUBCUTANEOUS | Status: DC

## 2015-04-24 MED ORDER — ATORVASTATIN CALCIUM 80 MG PO TABS
80.0000 mg | ORAL_TABLET | Freq: Every day | ORAL | Status: DC
  Administered 2015-04-24: 80 mg via ORAL
  Filled 2015-04-24: qty 1

## 2015-04-24 MED ORDER — MORPHINE SULFATE (PF) 2 MG/ML IV SOLN
2.0000 mg | INTRAVENOUS | Status: DC | PRN
Start: 2015-04-24 — End: 2015-04-25

## 2015-04-24 MED ORDER — SODIUM CHLORIDE 0.9 % IV BOLUS (SEPSIS)
1000.0000 mL | Freq: Once | INTRAVENOUS | Status: AC
  Administered 2015-04-24: 1000 mL via INTRAVENOUS

## 2015-04-24 MED ORDER — ONDANSETRON HCL 4 MG/2ML IJ SOLN: 4.0000 mg | Freq: Four times a day (QID) | INTRAMUSCULAR | Status: DC | PRN

## 2015-04-24 MED ORDER — ACETAMINOPHEN 325 MG PO TABS
650.0000 mg | ORAL_TABLET | ORAL | Status: DC | PRN
Start: 2015-04-24 — End: 2015-04-25

## 2015-04-24 MED ORDER — INSULIN ASPART 100 UNIT/ML ~~LOC~~ SOLN
0.0000 [IU] | Freq: Three times a day (TID) | SUBCUTANEOUS | Status: DC
  Administered 2015-04-24: 1 [IU] via SUBCUTANEOUS
  Administered 2015-04-25: 2 [IU] via SUBCUTANEOUS

## 2015-04-24 MED ORDER — CARVEDILOL 12.5 MG PO TABS
12.5000 mg | ORAL_TABLET | Freq: Two times a day (BID) | ORAL | Status: DC
  Administered 2015-04-24 – 2015-04-25 (×2): 12.5 mg via ORAL
  Filled 2015-04-24 (×2): qty 1

## 2015-04-24 MED ORDER — DIVALPROEX SODIUM 500 MG PO DR TAB
500.0000 mg | DELAYED_RELEASE_TABLET | Freq: Every day | ORAL | Status: DC
  Administered 2015-04-24: 500 mg via ORAL
  Filled 2015-04-24: qty 1

## 2015-04-24 MED ORDER — GI COCKTAIL ~~LOC~~: 30.0000 mL | Freq: Four times a day (QID) | ORAL | Status: DC | PRN

## 2015-04-24 NOTE — ED Notes (Addendum)
Pt from work via [employer] with c/o H/A, bilateral blurred vision, dizziness, with chest pain and SOB, worse during inspiration, and left arm numbness starting at approx 6 am.  Pt reports hx of TIA and DM.   Neg stroke exam by EMS.  Denies N/V/D.  Initial BP 70 palpated.  Given 400 mL NS.  Warm and dry.  Pt in NAD, A&O.

## 2015-04-24 NOTE — H&P (Signed)
[facility] History and Physical  Adyan Palau WUJ:[id number] DOB: 28-May-1971 DOA: 04/24/2015  Referring physician: Audie Pinto PCP: Minerva Ends, MD   Chief Complaint: chest pain  HPI: Nazaiah Navarrete is a 44 y.o. male with past medical history that includes diabetes, hypertension, hyperlipidemia, CAD, recent hospitalization and workup for TIA as as to the emergency department with the chief complaint of left anterior chest pain. Initial evaluation reveals anemia, acute kidney injury and is somewhat concerning for ACS.  Patient reports he was at work this morning and developed left anterior chest pain. He describes his work as not exertional. He describes the pain as sharp constant radiating to his chest. Associated symptoms include shortness of breath diaphoresis nausea without vomiting. In addition he experienced some lightheadedness and near syncope. He denies dysuria hematuria frequency or urgency. He denies any cough fever recent travel or sick contacts.  Workup in the emergency department includes a CBC with a hemoglobin of 10.3 down from 13.93 weeks ago, basic metabolic panel significant for creatinine of 1.76 serum glucose 181 BUN 28. FOBT negative, urinealysis with moderate hemoglobin and Hylan casts. Lactic acid 1.29 and then 0.64. Chest x-ray stable and negative no acute cardiopulmonary abnormality EKG SR. In the emergency department he is afebrile hemodynamically stable and not hypoxic  Review of Systems:  10 point review of systems complete and all systems are negative except as indicated in the history of present illness Past Medical History  Diagnosis Date  . Hypertension   . Diabetes mellitus without complication ([place])   . Coronary artery disease   . TIA (transient ischemic attack)   . Chest pain   . Anemia    Past Surgical History  Procedure Laterality Date  . Hernia repair    . Tonsills     . Tonsillectomy     Social History:  reports that he has never  smoked. He has never used smokeless tobacco. He reports that he does not drink alcohol or use illicit drugs. 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 He is independent with ADLs Allergies  Allergen Reactions  . Eggs Or Egg-Derived Products Diarrhea  . Milk-Related Compounds Diarrhea and Other (See Comments)    ANY DAIRY PRODUCTS-bloating    Family History  Problem Relation Age of Onset  . Hypertension Mother   . Diabetes Mother   . Hyperlipidemia Mother   . Heart disease Mother   . Diabetes Father   . Hypertension Father   . Stroke Father   . Heart disease Father     Prior to Admission medications   Medication Sig Start Date End Date Taking? Authorizing Provider  aspirin 325 MG tablet Take 1 tablet (325 mg total) by mouth daily. 04/07/15  Yes Josalyn Funches, MD  atorvastatin (LIPITOR) 80 MG tablet Take 1 tablet (80 mg total) by mouth daily. Patient taking differently: Take 80 mg by mouth daily at 6 PM.  04/04/15  Yes Nita Sells, MD  carvedilol (COREG) 12.5 MG tablet Take 1 tablet (12.5 mg total) by mouth 2 (two) times daily with a meal. 09/14/14  Yes Deepak Advani, MD  divalproex (DEPAKOTE) 500 MG DR tablet Take 1 tablet (500 mg total) by mouth at bedtime. 04/04/15  Yes Nita Sells, MD  glipiZIDE (GLUCOTROL) 5 MG tablet Take 1 tablet (5 mg total) by mouth 2 (two) times daily before a meal. 11/23/14  Yes Deepak Advani, MD  hydrochlorothiazide (MICROZIDE) 12.5 MG capsule TAKE 1 CAPSULE BY MOUTH DAILY. 01/31/15  Yes Tresa Garter, MD  ibuprofen (ADVIL,MOTRIN) 200 MG tablet Take 400 mg by mouth every 6 (six) hours as needed for moderate pain.   Yes Historical Provider, MD  lisinopril (PRINIVIL,ZESTRIL) 20 MG tablet Take 2 tablets (40 mg total) by mouth daily. Patient taking differently: Take 20 mg by mouth 2 (two) times daily.  01/31/15  Yes Tresa Garter, MD  metFORMIN (GLUCOPHAGE) 1000 MG tablet Take 1 tablet (1,000 mg total) by  mouth 2 (two) times daily with a meal. 09/14/14  Yes Deepak Advani, MD  omeprazole (PRILOSEC) 20 MG capsule Take 1 capsule (20 mg total) by mouth daily. 04/12/15  Yes Josalyn Funches, MD  sitaGLIPtin (JANUVIA) 100 MG tablet Take 1 tablet (100 mg total) by mouth daily. 04/07/15  Yes Josalyn Funches, MD  Vitamin D, Ergocalciferol, (DRISDOL) 50000 UNITS CAPS capsule Take 1 capsule (50,000 Units total) by mouth every 7 (seven) days. Patient taking differently: Take 50,000 Units by mouth every 7 (seven) days. On Sundays 09/23/14  Yes Lorayne Marek, MD  hydrocortisone (ANUSOL-HC) 25 MG suppository Place 1 suppository (25 mg total) rectally 2 (two) times daily. Patient taking differently: Place 25 mg rectally 2 (two) times daily as needed for hemorrhoids.  11/23/14   Lorayne Marek, MD   Physical Exam: Filed Vitals:   04/24/15 1300 04/24/15 1330 04/24/15 1400 04/24/15 1430  BP: 138/88 153/83 134/82 138/87  Pulse: 91 95 98 92  Temp:      TempSrc:      Resp: 19 14 18 23   SpO2: 99% 100% 100% 99%    Wt Readings from Last 3 Encounters:  04/07/15 75.297 kg (166 lb)  04/04/15 74.753 kg (164 lb 12.8 oz)  02/19/15 74.39 kg (164 lb)    General:  Appears calm and comfortable Eyes: PERRL, normal lids, irises & conjunctiva ENT: grossly normal hearing, lips & tongue Neck: no LAD, masses or thyromegaly Cardiovascular: RRR, no m/r/g. No LE edema. Telemetry: SR, no arrhythmias  Respiratory: CTA bilaterally, no w/r/r. Normal respiratory effort. Abdomen: soft, ntnd Skin: no rash or induration seen on limited exam Musculoskeletal: grossly normal tone BUE/BLE Psychiatric: grossly normal mood and affect, speech fluent and appropriate Neurologic: grossly non-focal.          Labs on Admission:  Basic Metabolic Panel:  Recent Labs Lab 04/24/15 0912  NA 140  K 4.0  CL 107  CO2 25  GLUCOSE 181*  BUN 28*  CREATININE 1.76*  CALCIUM 8.8*   Liver Function Tests: No results for input(s): AST, ALT,  ALKPHOS, BILITOT, PROT, ALBUMIN in the last 168 hours. No results for input(s): LIPASE, AMYLASE in the last 168 hours. No results for input(s): AMMONIA in the last 168 hours. CBC:  Recent Labs Lab 04/24/15 0912  WBC 9.6  HGB 10.3*  HCT 30.2*  MCV 90.4  PLT 193   Cardiac Enzymes: No results for input(s): CKTOTAL, CKMB, CKMBINDEX, TROPONINI in the last 168 hours.  BNP (last 3 results)  Recent Labs  11/29/14 2252  BNP 39.3    ProBNP (last 3 results) No results for input(s): PROBNP in the last 8760 hours.  CBG: No results for input(s): GLUCAP in the last 168 hours.  Radiological Exams on Admission: Dg Chest 2 View  04/24/2015  CLINICAL DATA:  44 year old male with chest pain and shortness of breath onset today. Initial encounter. EXAM: CHEST  2 VIEW COMPARISON:  04/02/2015 and earlier. FINDINGS: Lung volumes remain stable. Normal cardiac size and mediastinal contours. Lungs remain clear. No pneumothorax  or pleural effusion. Negative visible bowel gas pattern. Negative visualized osseous structures. IMPRESSION: Stable and negative.  No acute cardiopulmonary abnormality. Electronically Signed   By: Genevie Ann M.D.   On: 04/24/2015 08:45    EKG: Independently reviewed as above  Assessment/Plan Principal Problem:   Chest pain Active Problems:   DM2 (diabetes mellitus, type 2) ([facility])   HLD (hyperlipidemia)   Essential hypertension   DMDD (disruptive mood dysregulation disorder) ([facility])   Anemia   AKI (acute kidney injury) ([place])   Diabetes mellitus without complication ([place])   #1. Chest pain at rest. Some typical and atypical features heart score is 3. Will admit for observation to telemetry to rule out. Initial troponin negative. We'll cycle troponin get serial EKGs. Will provide supportive therapy in the form of analgesia and antimanic. GI cocktail and Protonix. A benefit from outpatient stress test once he has ruled out.  2. Acute kidney injury. He doesn't appear particularly  dehydrated at the point of admission. Home medications include hydrochlorothiazide, lisinopril, I will hold these for now. We'll very gently hydrate monitor urine output. Will recheck in the morning.  #3. Anemia. Hemoglobin on admission 10.9. It was greater than 13 3 weeks ago. FOBT negative in the emergency department. Will obtain an anemia panel. May be related to chronic disease. No signs symptoms of obvious bleeding. Will hold aspirin for now  #4. Diabetes. He is on oral agents which I will hold as his appetite is somewhat unreliable. Will use sliding scale insulin for optimal control. Will obtain a hemoglobin A1c.  #5. DM DDD. Last hospitalization 3 weeks ago evaluated by behavioral health. Started on Depakote. I will continue this and obtain a level. [place] stable at baseline    Code Status: full DVT Prophylaxis: Family Communication: none present Disposition Plan: home hopefully 24 hours  Time spent: 48 minutes  [facility]

## 2015-04-24 NOTE — ED Notes (Signed)
Myself and Santiago Glad, RN undressed patient, in gown, on monitor, on continuous pulse oximetry and blood pressure cuff

## 2015-04-24 NOTE — ED Notes (Signed)
Patient transported to X-ray 

## 2015-04-24 NOTE — ED Provider Notes (Signed)
CSN: [id number]     Arrival date & time 04/24/15  0815 History   None    Chief Complaint  Patient presents with  . Chest Pain  . Dizziness  . Blurred Vision    HPI   44 year old male with a history of hypertension, diabetes, CAD, TIA presents today with chest pain. Patient reports that he was at work this morning at 6 AM when he started to develop chest pain, blurred vision, dizziness. He reports radiation into his left shoulder. He reports dizziness, blurred vision and shortness of breath resolved prior to arrival to the emergency room. Patient reports pain with deep inspiration, describes this as sharp, throughout his entire anterior chest wall. Patient reports pain with palpation of the chest wall, denies cough, fever, chills, nausea, vomiting, abdominal pain, history of GERD, lower extremity swelling or edema. Patient denies any history of DVT or PE, no recent surgeries or trauma, no prolonged immobilization, no history of malignancy. Patient reports history of the same previously, was admitted to the hospital on 04/02/2015 with similar complaints including left-sided numbness tingling and weakness. Patient had cardiac workup while here in the ED with no significant findings. Patient had echocardiogram on 04/03/2015 with LVEF of 55-60%. Patient had carotid duplex done on 08/26/2014, along with another echocardiogram showing no significant findings. Patient also notes that over the last year he's had red blood per rectum, reports some episodes of abdominal discomfort with "cramping" and large amounts of blood. He reports that he has not seen a GI specialist for this, although his last hospital admission he was encouraged to follow up. He has appointment scheduled in January, he does not know who this is with. Patient denies any blood per rectum today.  Past Medical History  Diagnosis Date  . Hypertension   . Diabetes mellitus without complication ([place])   . Coronary artery disease   . TIA  (transient ischemic attack)   . Chest pain   . Anemia    Past Surgical History  Procedure Laterality Date  . Hernia repair    . Tonsills     . Tonsillectomy     Family History  Problem Relation Age of Onset  . Hypertension Mother   . Diabetes Mother   . Hyperlipidemia Mother   . Heart disease Mother   . Diabetes Father   . Hypertension Father   . Stroke Father   . Heart disease Father    Social History  Substance Use Topics  . Smoking status: Never Smoker   . Smokeless tobacco: Never Used  . Alcohol Use: No    Review of Systems  All other systems reviewed and are negative.   Allergies  Eggs or egg-derived products and Milk-related compounds  Home Medications   Prior to Admission medications   Medication Sig Start Date End Date Taking? Authorizing Provider  aspirin 325 MG tablet Take 1 tablet (325 mg total) by mouth daily. 04/07/15  Yes Josalyn Funches, MD  atorvastatin (LIPITOR) 80 MG tablet Take 1 tablet (80 mg total) by mouth daily. Patient taking differently: Take 80 mg by mouth daily at 6 PM.  04/04/15  Yes Nita Sells, MD  carvedilol (COREG) 12.5 MG tablet Take 1 tablet (12.5 mg total) by mouth 2 (two) times daily with a meal. 09/14/14  Yes Deepak Advani, MD  divalproex (DEPAKOTE) 500 MG DR tablet Take 1 tablet (500 mg total) by mouth at bedtime. 04/04/15  Yes Nita Sells, MD  glipiZIDE (GLUCOTROL) 5 MG tablet Take 1 tablet (  5 mg total) by mouth 2 (two) times daily before a meal. 11/23/14  Yes Deepak Advani, MD  hydrochlorothiazide (MICROZIDE) 12.5 MG capsule TAKE 1 CAPSULE BY MOUTH DAILY. 01/31/15  Yes Tresa Garter, MD  ibuprofen (ADVIL,MOTRIN) 200 MG tablet Take 400 mg by mouth every 6 (six) hours as needed for moderate pain.   Yes Historical Provider, MD  lisinopril (PRINIVIL,ZESTRIL) 20 MG tablet Take 2 tablets (40 mg total) by mouth daily. Patient taking differently: Take 20 mg by mouth 2 (two) times daily.  01/31/15  Yes Tresa Garter, MD  metFORMIN (GLUCOPHAGE) 1000 MG tablet Take 1 tablet (1,000 mg total) by mouth 2 (two) times daily with a meal. 09/14/14  Yes Deepak Advani, MD  omeprazole (PRILOSEC) 20 MG capsule Take 1 capsule (20 mg total) by mouth daily. 04/12/15  Yes Josalyn Funches, MD  sitaGLIPtin (JANUVIA) 100 MG tablet Take 1 tablet (100 mg total) by mouth daily. 04/07/15  Yes Josalyn Funches, MD  Vitamin D, Ergocalciferol, (DRISDOL) 50000 UNITS CAPS capsule Take 1 capsule (50,000 Units total) by mouth every 7 (seven) days. Patient taking differently: Take 50,000 Units by mouth every 7 (seven) days. On Sundays 09/23/14  Yes Lorayne Marek, MD  hydrocortisone (ANUSOL-HC) 25 MG suppository Place 1 suppository (25 mg total) rectally 2 (two) times daily. Patient taking differently: Place 25 mg rectally 2 (two) times daily as needed for hemorrhoids.  11/23/14   Lorayne Marek, MD   BP 136/89 mmHg  Pulse 90  Temp(Src) 98.3 F (36.8 C) (Oral)  Resp 23  Ht 5\' 8"  (1.727 m)  Wt 161 lb 1.6 oz (73.074 kg)  BMI 24.50 kg/m2  SpO2 100%   Physical Exam  Constitutional: He is oriented to person, place, and time. He appears well-developed and well-nourished.  HENT:  Head: Normocephalic and atraumatic.  Eyes: Conjunctivae are normal. Pupils are equal, round, and reactive to light. Right eye exhibits no discharge. Left eye exhibits no discharge. No scleral icterus.  Neck: Normal range of motion. No JVD present. No tracheal deviation present.  Pulmonary/Chest: Effort normal. No stridor.  Abdominal: Soft. He exhibits no mass. There is no tenderness. There is no rebound and no guarding.  Musculoskeletal: Normal range of motion. He exhibits no edema or tenderness.  Neurological: He is alert and oriented to person, place, and time. He has normal strength. No cranial nerve deficit or sensory deficit. He displays a negative Romberg sign. Coordination and gait normal. GCS eye subscore is 4. GCS verbal subscore is 5. GCS motor subscore  is 6.  Skin: Skin is warm and dry. No rash noted. No erythema. No pallor.  Psychiatric: He has a normal mood and affect. His behavior is normal. Judgment and thought content normal.  Nursing note and vitals reviewed.     ED Course  Procedures (including critical care time) Labs Review Labs Reviewed  BASIC METABOLIC PANEL - Abnormal; Notable for the following:    Glucose, Bld 181 (*)    BUN 28 (*)    Creatinine, Ser 1.76 (*)    Calcium 8.8 (*)    GFR calc non Af Amer 45 (*)    GFR calc Af Amer 52 (*)    All other components within normal limits  CBC - Abnormal; Notable for the following:    RBC 3.34 (*)    Hemoglobin 10.3 (*)    HCT 30.2 (*)    All other components within normal limits  URINALYSIS, ROUTINE W REFLEX MICROSCOPIC (NOT AT [facility]) -  Abnormal; Notable for the following:    Hgb urine dipstick MODERATE (*)    All other components within normal limits  URINE MICROSCOPIC-ADD ON - Abnormal; Notable for the following:    Casts HYALINE CASTS (*)    All other components within normal limits  TROPONIN I  GLUCOSE, CAPILLARY  HEMOGLOBIN A1C  TROPONIN I  TROPONIN I  TROPONIN I  CBC  CREATININE, SERUM  VITAMIN B12  FOLATE  IRON AND TIBC  FERRITIN  RETICULOCYTES  VALPROIC ACID LEVEL  I-STAT TROPOININ, ED  POC OCCULT BLOOD, ED  I-STAT CG4 LACTIC ACID, ED  I-STAT CG4 LACTIC ACID, ED    Imaging Review Dg Chest 2 View  04/24/2015  CLINICAL DATA:  44 year old male with chest pain and shortness of breath onset today. Initial encounter. EXAM: CHEST  2 VIEW COMPARISON:  04/02/2015 and earlier. FINDINGS: Lung volumes remain stable. Normal cardiac size and mediastinal contours. Lungs remain clear. No pneumothorax or pleural effusion. Negative visible bowel gas pattern. Negative visualized osseous structures. IMPRESSION: Stable and negative.  No acute cardiopulmonary abnormality. Electronically Signed   By: Genevie Ann M.D.   On: 04/24/2015 08:45   I have personally reviewed and  evaluated these images and lab results as part of my medical decision-making.   EKG Interpretation   Date/Time:  Monday April 24 2015 08:22:37 EST Ventricular Rate:  94 PR Interval:  158 QRS Duration: 98 QT Interval:  369 QTC Calculation: 461 R Axis:   80 Text Interpretation:  Pacemaker spikes or artifacts Sinus rhythm Probable  left ventricular hypertrophy Baseline wander in lead(s) II III aVF  Confirmed by BEATON  MD, ROBERT ([id number]) on 04/24/2015 2:04:54 PM      MDM   Final diagnoses:  Chest pain, unspecified chest pain type    Labs: I-STAT lactic acid, troponin, urinalysis, CBC, BMP- creatinine of 1.76 from 0.9 and 1016, hemoglobin 10.3 down from 13.9 and 10/16  Imaging: DG chest  Consults: Hospitalist service  Therapeutics: Normal saline  Discharge Meds:   Assessment/Plan: 43 year old male with chest pain, dizziness. Initial troponin and EKG showed no signs of acute cardiac pathology. Due to patient's persistent chest pain, elevated creatinine, I and down trending hemoglobin hospitalist service consult for further evaluation and management. Patient remained stable while here in the ED.         Okey Regal, PA-C 04/24/15 1627  Leonard Schwartz, MD 04/26/15 [phone]

## 2015-04-25 ENCOUNTER — Observation Stay (HOSPITAL_COMMUNITY): Payer: MEDICAID

## 2015-04-25 ENCOUNTER — Encounter (HOSPITAL_COMMUNITY): Payer: Self-pay | Admitting: Nurse Practitioner

## 2015-04-25 ENCOUNTER — Observation Stay (HOSPITAL_BASED_OUTPATIENT_CLINIC_OR_DEPARTMENT_OTHER): Payer: Self-pay

## 2015-04-25 ENCOUNTER — Observation Stay (HOSPITAL_COMMUNITY): Payer: Self-pay

## 2015-04-25 DIAGNOSIS — E785 Hyperlipidemia, unspecified: Secondary | ICD-10-CM

## 2015-04-25 DIAGNOSIS — N179 Acute kidney failure, unspecified: Secondary | ICD-10-CM

## 2015-04-25 DIAGNOSIS — E1149 Type 2 diabetes mellitus with other diabetic neurological complication: Secondary | ICD-10-CM

## 2015-04-25 DIAGNOSIS — R072 Precordial pain: Secondary | ICD-10-CM

## 2015-04-25 DIAGNOSIS — I10 Essential (primary) hypertension: Secondary | ICD-10-CM

## 2015-04-25 DIAGNOSIS — R079 Chest pain, unspecified: Secondary | ICD-10-CM

## 2015-04-25 LAB — GLUCOSE, CAPILLARY
GLUCOSE-CAPILLARY: 154 mg/dL — AB (ref 65–99)
Glucose-Capillary: 82 mg/dL (ref 65–99)
Glucose-Capillary: 164 mg/dL — ABNORMAL HIGH (ref 65–99)

## 2015-04-25 LAB — BASIC METABOLIC PANEL
Anion gap: 9 (ref 5–15)
BUN: 12 mg/dL (ref 6–20)
CALCIUM: 8.8 mg/dL — AB (ref 8.9–10.3)
CHLORIDE: 105 mmol/L (ref 101–111)
CO2: 27 mmol/L (ref 22–32)
CREATININE: 0.87 mg/dL (ref 0.61–1.24)
GFR calc Af Amer: >60 mL/min (ref >60)
GFR calc non Af Amer: >60 mL/min (ref >60)
Glucose, Bld: 194 mg/dL — ABNORMAL HIGH (ref 65–99)
Potassium: 3.8 mmol/L (ref 3.5–5.1)
SODIUM: 141 mmol/L (ref 135–145)

## 2015-04-25 LAB — CBC
HCT: 31.7 % — ABNORMAL LOW (ref 39.0–52.0)
Hemoglobin: 10.3 g/dL — ABNORMAL LOW (ref 13.0–17.0)
MCH: 29.4 pg (ref 26.0–34.0)
MCHC: 32.5 g/dL (ref 30.0–36.0)
MCV: 90.6 fL (ref 78.0–100.0)
PLATELETS: 212 10*3/uL (ref 150–400)
RBC: 3.5 MIL/uL — ABNORMAL LOW (ref 4.22–5.81)
RDW: 12.6 % (ref 11.5–15.5)
WBC: 6.8 10*3/uL (ref 4.0–10.5)

## 2015-04-25 LAB — HEMOGLOBIN A1C
Hgb A1c MFr Bld: 8.7 % — ABNORMAL HIGH (ref 4.8–5.6)
Mean Plasma Glucose: 203 mg/dL

## 2015-04-25 MED ORDER — REGADENOSON 0.4 MG/5ML IV SOLN: 0.4000 mg | Freq: Once | INTRAVENOUS | Status: DC

## 2015-04-25 MED ORDER — REGADENOSON 0.4 MG/5ML IV SOLN
INTRAVENOUS | Status: AC
  Filled 2015-04-25: qty 5

## 2015-04-25 MED ORDER — REGADENOSON 0.4 MG/5ML IV SOLN
0.4000 mg | Freq: Once | INTRAVENOUS | Status: DC
  Filled 2015-04-25: qty 5

## 2015-04-25 NOTE — Progress Notes (Signed)
Discharge instructions received and discussed with pt. And family. No new prescriptions. Verbalized understanding of follow up instructions.

## 2015-04-25 NOTE — Consult Note (Signed)
CARDIOLOGY CONSULT NOTE   Patient ID: Shane [name] MRN: [id number], DOB/AGE: 46-Aug-1972   Admit date: 04/24/2015 Date of Consult: 04/25/2015   Primary Physician: Minerva Ends, MD Primary Cardiologist: new - seen by B. Stanford Breed, MD   Pt. Profile  71 rolled male without prior history of coronary artery disease who presented to the emergency department on the seventh with chest pain.  Problem List  Past Medical History  Diagnosis Date  . Hypertension     a. 08/2014 Admitted with hypertensive urgency.  . Chest pain     a. 2015 Reportedly normal stress test in [place].  Marland Kitchen TIA (transient ischemic attack) 08/2014; 03/2015    a. 08/2014 in setting of hypertensive urgency.  . Anemia   . Refusal of blood transfusions as patient is Jehovah's Witness   . Hyperlipidemia   . Chronic diastolic CHF (congestive heart failure) (HCC)     a.03/2015 Echo: EF 55-60%, Gr 1 DD, mild MR, triv PR.  . Type II diabetes mellitus ([place])   . Sleep apnea   . GERD (gastroesophageal reflux disease)   . Anxiety   . Depression   . Bipolar disorder [facility])     Past Surgical History  Procedure Laterality Date  . Inguinal hernia repair Bilateral ~ 1983- ~ 1986  . Tonsillectomy  ~ 1985     Allergies  Allergies  Allergen Reactions  . Eggs Or Egg-Derived Products Diarrhea  . Milk-Related Compounds Diarrhea and Other (See Comments)    ANY DAIRY PRODUCTS-bloating    HPI   8 rolled male with a prior history of hypertension, diabetes, depression, bipolar disorder, and TIAs (see March 2016 and again in October 2016). He also has a history of chest discomfort occurring several times per year over the past 2 years, generally associated with periods of stress and anxiety, lasting up to a day in duration, and resolving spontaneously. He says that he was admitted to a hospital in [place] in 2015 and had a negative stress test at that time. He's been admitted to [facility] twice this year with TIAs, most recently in  October 2016. Echocardiogram at that time showed normal LV function. He lives locally with his wife and reports being reasonably active. He works at an [employer] and says he has a fair amount of exertion at work. He works night shift and on the evening of November 6, he decided that he was going to walk/right his bike to work. This is an 8 mile journey. After getting to work at about 1:30 in the morning, he had some water to drink and ate some chips. He went on to work his usual activities for the next 4 hours. At 6 AM, he began to do more exertion and lifting of boxes. With that, he developed substernal chest pressure associated with mild dyspnea. Symptoms lasted 5-10 minutes and resolved with rest. When he went back to lifting boxes again, he developed recurrent substernal chest discomfort with dyspnea. This again lasted 5-10 minutes and resolved with rest. He told coworker and then EMS was called. [facility] was [facility] ED where ECG was nonacute and troponin was normal. He has since ruled out for myocardial infarction. He has had no recurrence of chest discomfort.  Of note, he reports a several year history of bright red blood per rectum as well as in unintentional weight loss. He says that he has a history of internal hemorrhoids status post hemorrhoidectomy several years ago.  Inpatient Medications  .  atorvastatin  80 mg Oral q1800  . carvedilol  12.5 mg Oral BID WC  . divalproex  500 mg Oral QHS  . heparin  5,000 Units Subcutaneous 3 times per day  . insulin aspart  0-5 Units Subcutaneous QHS  . insulin aspart  0-9 Units Subcutaneous TID WC  . pantoprazole (PROTONIX) IV  40 mg Intravenous Q24H    Family History Family History  Problem Relation Age of Onset  . Hypertension Mother   . Diabetes Mother   . Hyperlipidemia Mother   . Heart disease Mother     s/p pacemaker  . Diabetes Father   . Hypertension Father   . Stroke Father   . Heart attack Father     first MI @ 84.    . Stroke Brother      Social History Social History   Social History  . Marital Status: Married    Spouse Name: N/A  . Number of Children: N/A  . Years of Education: N/A   Occupational History  . Not on file.   Social History Main Topics  . Smoking status: Never Smoker   . Smokeless tobacco: Never Used  . Alcohol Use: No  . Drug Use: No  . Sexual Activity: No   Other Topics Concern  . Not on file   Social History Narrative   Lives in [place] with wife.  Active but doesn't routinely exercise.     Review of Systems  General:  No chills, fever, night sweats or weight changes.  Cardiovascular:  +++ chest pain w/ associated dyspnea.  No edema, orthopnea, palpitations, paroxysmal nocturnal dyspnea. Dermatological: No rash, lesions/masses Respiratory: No cough, +++ dyspnea prior to admission. Urologic: No hematuria, dysuria Abdominal:   No nausea, vomiting, diarrhea, bright red blood per rectum, melena, or hematemesis Neurologic:  No visual changes, wkns, changes in mental status. All other systems reviewed and are otherwise negative except as noted above.  Physical Exam  Blood pressure 136/80, pulse 85, temperature 98.5 F (36.9 C), temperature source Oral, resp. rate 18, height 5\' 8"  (1.727 m), weight 161 lb 1.6 oz (73.074 kg), SpO2 98 %.  General: Pleasant, NAD Psych: Normal affect. Neuro: Alert and oriented X 3. Moves all extremities spontaneously. HEENT: Normal  Neck: Supple without bruits or JVD. Lungs:  Resp regular and unlabored, CTA. Heart: RRR no s3, s4, or murmurs. Abdomen: Soft, non-tender, non-distended, BS + x 4.  Extremities: No clubbing, cyanosis or edema. DP/PT/Radials 2+ and equal bilaterally.  Labs   Recent Labs  04/24/15 1356 04/24/15 1555 04/24/15 1829 04/24/15 2049  TROPONINI <0.03 <0.03 <0.03 <0.03   Lab Results  Component Value Date   WBC 6.8 04/25/2015   HGB 10.3* 04/25/2015   HCT 31.7* 04/25/2015   MCV 90.6 04/25/2015   PLT 212  04/25/2015    Recent Labs Lab 04/25/15 0825  NA 141  K 3.8  CL 105  CO2 27  BUN 12  CREATININE 0.87  CALCIUM 8.8*  GLUCOSE 194*   Lab Results  Component Value Date   CHOL 160 04/03/2015   HDL 32* 04/03/2015   LDLCALC 109* 04/03/2015   TRIG 93 04/03/2015    Radiology/Studies  Dg Chest 2 View  04/24/2015  CLINICAL DATA:  44 year old male with chest pain and shortness of breath onset today. Initial encounter. EXAM: CHEST  2 VIEW COMPARISON:  04/02/2015 and earlier. FINDINGS: Lung volumes remain stable. Normal cardiac size and mediastinal contours. Lungs remain clear. No pneumothorax or pleural effusion. Negative visible  bowel gas pattern. Negative visualized osseous structures. IMPRESSION: Stable and negative.  No acute cardiopulmonary abnormality. Electronically Signed   By: Genevie Ann M.D.   On: 04/24/2015 08:45   Dg Chest 2 View  04/02/2015  CLINICAL DATA:  Chest pain for 1 day with swelling of the right hand, lethargy, and drooping left eye EXAM: CHEST  2 VIEW COMPARISON:  02/19/2015 FINDINGS: The heart size and mediastinal contours are within normal limits. Both lungs are clear. The visualized skeletal structures are unremarkable. IMPRESSION: No active cardiopulmonary disease. Electronically Signed   By: Skipper Cliche M.D.   On: 04/02/2015 15:15   Ct Head Wo Contrast  04/02/2015  CLINICAL DATA:  Left-sided headache.  Possible stroke EXAM: CT HEAD WITHOUT CONTRAST TECHNIQUE: Contiguous axial images were obtained from the base of the skull through the vertex without intravenous contrast. COMPARISON:  CT head 11/30/2014 FINDINGS: Ventricle size is normal. Negative for acute or chronic infarction. Negative for hemorrhage or fluid collection. Negative for mass or edema. No shift of the midline structures. Calvarium is intact. Mild mucosal edema in the ethmoid sinuses. IMPRESSION: Normal CT of the head.  No acute abnormality. Electronically Signed   By: Franchot Gallo M.D.   On:  04/02/2015 16:41   Mr Brain Wo Contrast  04/02/2015  CLINICAL DATA:  Chest pain last night, woke up this morning with numbness in the left side of body and face. Arm drift. EXAM: MRI HEAD WITHOUT CONTRAST TECHNIQUE: Multiplanar, multiecho pulse sequences of the brain and surrounding structures were obtained without intravenous contrast. COMPARISON:  08/25/2014 FINDINGS: Calvarium and upper cervical spine: No focal marrow signal abnormality. Orbits: No significant findings. Sinuses and Mastoids: Clear. Brain: No acute infarct, hemorrhage, hydrocephalus, or mass lesion. No evidence of large vessel occlusion. Few hyperintense foci in the cerebral white matter are allowable for age; no indication of demyelinating process. Normal cerebral volume. IMPRESSION: Stable and unremarkable brain MRI. Electronically Signed   By: Monte Fantasia M.D.   On: 04/02/2015 18:32   Dg Hand Complete Right  04/02/2015  CLINICAL DATA:  Melinda Crutch to the right hand during a fall the night of 04/01/2015. Pain. Initial encounter. EXAM: RIGHT HAND - COMPLETE 3+ VIEW COMPARISON:  None. FINDINGS: The patient has a fracture through the neck of the fifth metacarpal with mild volar and radial angulation. Soft tissue swelling is present about the fracture. No other acute bony or joint abnormality is seen. Mild ulnar minus variance is noted. IMPRESSION: Acute fracture neck of the right fifth metacarpal. Electronically Signed   By: Inge Rise M.D.   On: 04/02/2015 16:22    ECG  RSR, 83, no acute ST/T changes.  ASSESSMENT AND PLAN  1.  Unstable Angina:  Patient presented to the [facility] emergency  department on November 7 with complaints of exertional substernal chest discomfort associated with dyspnea and resolved with rest.  He has since ruled out for myocardial infarction with normal troponins and ECG. He has had no recurrent symptoms.  Given risk factors of hypertension, history of TIA with mild carotid disease, diabetes, and  family history of premature coronary artery disease (see father developed heart disease at age 52), we'll perform an exercise Cardiolite today to rule out ischemia. Continue statin therapy. He is not currently on aspirin and given history of bright red blood per rectum, agree with holding off on initiation until we have further objective evidence of ischemia. He is on a beta blocker this has been held this morning.  2.  Essential hypertension: Stable.  3. Hyperlipidemia: On statin. Most recent LDL was 109 in October. He is now on high potency statin.  4. Bright red Blood per rectum: This is been ongoing over the past year. He also reports unintentional weight loss. He has not had recent GI evaluation. We'll need to keep this in mind if he requires further ischemic evaluation beyond stress testing. Further workup per medicine.  5. Diabetes mellitus: Per medicine.  6. Bipolar disorder: Per medicine.   Signed, Murray Hodgkins, NP 04/25/2015, 10:56 AM   As above; patient seen and examined; briefly 44 yo male with PMH of DM, HTN, hyperlipidemia, TIA, bipolar disorder for evaluation of CP. Patient developed sharp CP at work associated with dyspnea; Not pleuritic, positional or clearly exertional. There was no nausea but there was mild diaphoresis. Pain lasted several hours and resolved. He typically does not have dyspnea on exertion or exertional chest pain. Patient has ruled out with no recurrent symptoms. Electrocardiogram shows no ST changes. Multiple risk factors including 17 years of diabetes mellitus. Plan stress nuclear study for risk stratification. Further evaluation of anemia per primary care. Notes some hematochezia per patient report. Kirk Ruths

## 2015-04-25 NOTE — Progress Notes (Signed)
Nutrition Brief Note  Patient identified on the Malnutrition Screening Tool (MST) Report. Patient with some weight loss, he thinks is related to his physical job. Nutrition focused physical exam completed.  No muscle or subcutaneous fat depletion noticed.  Wt Readings from Last 15 Encounters:  04/24/15 161 lb 1.6 oz (73.074 kg)  04/07/15 166 lb (75.297 kg)  04/04/15 164 lb 12.8 oz (74.753 kg)  02/19/15 164 lb (74.39 kg)  01/22/15 170 lb (77.111 kg)  11/29/14 170 lb (77.111 kg)  11/23/14 170 lb 12.8 oz (77.474 kg)  09/14/14 161 lb 12.8 oz (73.392 kg)  08/31/14 162 lb (73.483 kg)  08/26/14 171 lb 11.8 oz (77.9 kg)    Body mass index is 24.5 kg/(m^2). Patient meets criteria for normal weight based on current BMI.   Current diet order is heart healthy CHO modified, patient is consuming approximately 100% of meals at this time. Patient reports good appetite and good PO intake. Labs and medications reviewed.   No nutrition interventions warranted at this time. If nutrition issues arise, please consult RD.   Molli Barrows, RD, LDN, [place] Pager [phone] After Hours Pager [phone]

## 2015-04-25 NOTE — Discharge Instructions (Signed)
Follow with Primary MD  Minerva Ends, MD  and other consultant as instructed your Hospitalist MD  Please seek immediate medical attention if chronic intermittent rectal bleeding worsens  Please get a complete blood count and chemistry panel checked by your Primary MD at your next visit, and again as instructed by your Primary MD.  Get Medicines reviewed and adjusted. Please take all your medications with you for your next visit with your Primary MD  Please request your Primary MD to go over all hospital tests and procedure/radiological results at the follow up, please ask your Primary MD to get all Hospital records sent to his/her office.  If you experience worsening of your admission symptoms, develop shortness of breath, life threatening emergency, suicidal or homicidal thoughts you must seek medical attention immediately by calling 911 or calling your MD immediately  if symptoms less severe.  You must read complete instructions/literature along with all the possible adverse reactions/side effects for all the Medicines you take and that have been prescribed to you. Take any new Medicines after you have completely understood and accpet all the possible adverse reactions/side effects.   Do not drive when taking Pain medications or sleeping medications (Benzodaizepines)  Do not take more than prescribed Pain, Sleep and Anxiety Medications  Special Instructions: If you have smoked or chewed Tobacco  in the last 2 yrs please stop smoking, stop any regular Alcohol  and or any Recreational drug use.  Wear Seat belts while driving.  Please note  You were cared for by a hospitalist during your hospital stay. Once you are discharged, your primary care physician will handle any further medical issues. Please note that NO REFILLS for any discharge medications will be authorized once you are discharged, as it is imperative that you return to your primary care physician (or establish a  relationship with a primary care physician if you do not have one) for your aftercare needs so that they can reassess your need for medications and monitor your lab values.

## 2015-04-25 NOTE — Discharge Summary (Addendum)
PATIENT DETAILS Name: Shane [name] Age: 44 y.o. Sex: male Date of Birth: 1970/06/19 MRN: [id number]. Admitting Physician: Cristal Ford, DO LFY:[id number], Lennox Laity, MD  Admit Date: 04/24/2015 Discharge date: 04/25/2015  Recommendations for Outpatient Follow-up:  1. Ensure follow up with GI for Colonoscopy (per patient-has follow up in Jan 2017)  2. Repeat CBC and chemistries at next visit 3. Continue to follow CBCs closely  PRIMARY DISCHARGE DIAGNOSIS:  Principal Problem:   Chest pain Active Problems:   DM2 (diabetes mellitus, type 2) (HCC)   HLD (hyperlipidemia)   Essential hypertension   DMDD (disruptive mood dysregulation disorder) (HCC)   Anemia   AKI (acute kidney injury) ([place])   Diabetes mellitus without complication ([place])      PAST MEDICAL HISTORY: Past Medical History  Diagnosis Date  . Hypertension     a. 08/2014 Admitted with hypertensive urgency.  . Chest pain     a. 2015 Reportedly normal stress test in [place].  Marland Kitchen TIA (transient ischemic attack) 08/2014; 03/2015    a. 08/2014 in setting of hypertensive urgency.  . Anemia   . Refusal of blood transfusions as patient is Jehovah's Witness   . Hyperlipidemia   . Chronic diastolic CHF (congestive heart failure) (HCC)     a.03/2015 Echo: EF 55-60%, Gr 1 DD, mild MR, triv PR.  . Type II diabetes mellitus ([facility])   . Sleep apnea   . GERD (gastroesophageal reflux disease)   . Anxiety   . Depression   . Bipolar disorder ([place])     DISCHARGE MEDICATIONS: Current Discharge Medication List    CONTINUE these medications which have NOT CHANGED   Details  aspirin 325 MG tablet Take 1 tablet (325 mg total) by mouth daily. Qty: 30 tablet, Refills: 5   Associated Diagnoses: Transient cerebral ischemia, unspecified transient cerebral ischemia type    atorvastatin (LIPITOR) 80 MG tablet Take 1 tablet (80 mg total) by mouth daily. Qty: 30 tablet, Refills: 12    carvedilol (COREG) 12.5 MG tablet Take 1 tablet (12.5 mg  total) by mouth 2 (two) times daily with a meal. Qty: 60 tablet, Refills: 3    divalproex (DEPAKOTE) 500 MG DR tablet Take 1 tablet (500 mg total) by mouth at bedtime. Qty: 30 tablet, Refills: 0    glipiZIDE (GLUCOTROL) 5 MG tablet Take 1 tablet (5 mg total) by mouth 2 (two) times daily before a meal. Qty: 60 tablet, Refills: 3   Associated Diagnoses: Other specified diabetes mellitus without complications (HCC)    hydrochlorothiazide (MICROZIDE) 12.5 MG capsule TAKE 1 CAPSULE BY MOUTH DAILY. Qty: 30 capsule, Refills: 2    lisinopril (PRINIVIL,ZESTRIL) 20 MG tablet Take 2 tablets (40 mg total) by mouth daily. Qty: 60 tablet, Refills: 1    metFORMIN (GLUCOPHAGE) 1000 MG tablet Take 1 tablet (1,000 mg total) by mouth 2 (two) times daily with a meal. Qty: 180 tablet, Refills: 3    omeprazole (PRILOSEC) 20 MG capsule Take 1 capsule (20 mg total) by mouth daily. Qty: 30 capsule, Refills: 3   Associated Diagnoses: Dyspepsia    sitaGLIPtin (JANUVIA) 100 MG tablet Take 1 tablet (100 mg total) by mouth daily. Qty: 30 tablet, Refills: 11   Associated Diagnoses: Type 2 diabetes mellitus with other neurologic complication (HCC)    Vitamin D, Ergocalciferol, (DRISDOL) 50000 UNITS CAPS capsule Take 1 capsule (50,000 Units total) by mouth every 7 (seven) days. Qty: 12 capsule, Refills: 0    hydrocortisone (ANUSOL-HC) 25 MG suppository Place 1 suppository (25  mg total) rectally 2 (two) times daily. Qty: 12 suppository, Refills: 0   Associated Diagnoses: Hemorrhoids, unspecified hemorrhoid type      STOP taking these medications     ibuprofen (ADVIL,MOTRIN) 200 MG tablet         ALLERGIES:   Allergies  Allergen Reactions  . Eggs Or Egg-Derived Products Diarrhea  . Milk-Related Compounds Diarrhea and Other (See Comments)    ANY DAIRY PRODUCTS-bloating    BRIEF HPI:  See H&P, Labs, Consult and Test reports for all details in brief, patient was admitted for evaluation of chest  pain.  CONSULTATIONS:   cardiology  PERTINENT RADIOLOGIC STUDIES: Dg Chest 2 View  04/24/2015  CLINICAL DATA:  44 year old male with chest pain and shortness of breath onset today. Initial encounter. EXAM: CHEST  2 VIEW COMPARISON:  04/02/2015 and earlier. FINDINGS: Lung volumes remain stable. Normal cardiac size and mediastinal contours. Lungs remain clear. No pneumothorax or pleural effusion. Negative visible bowel gas pattern. Negative visualized osseous structures. IMPRESSION: Stable and negative.  No acute cardiopulmonary abnormality. Electronically Signed   By: Genevie Ann M.D.   On: 04/24/2015 08:45   Dg Chest 2 View  04/02/2015  CLINICAL DATA:  Chest pain for 1 day with swelling of the right hand, lethargy, and drooping left eye EXAM: CHEST  2 VIEW COMPARISON:  02/19/2015 FINDINGS: The heart size and mediastinal contours are within normal limits. Both lungs are clear. The visualized skeletal structures are unremarkable. IMPRESSION: No active cardiopulmonary disease. Electronically Signed   By: Skipper Cliche M.D.   On: 04/02/2015 15:15   Ct Head Wo Contrast  04/02/2015  CLINICAL DATA:  Left-sided headache.  Possible stroke EXAM: CT HEAD WITHOUT CONTRAST TECHNIQUE: Contiguous axial images were obtained from the base of the skull through the vertex without intravenous contrast. COMPARISON:  CT head 11/30/2014 FINDINGS: Ventricle size is normal. Negative for acute or chronic infarction. Negative for hemorrhage or fluid collection. Negative for mass or edema. No shift of the midline structures. Calvarium is intact. Mild mucosal edema in the ethmoid sinuses. IMPRESSION: Normal CT of the head.  No acute abnormality. Electronically Signed   By: Franchot Gallo M.D.   On: 04/02/2015 16:41   Mr Brain Wo Contrast  04/02/2015  CLINICAL DATA:  Chest pain last night, woke up this morning with numbness in the left side of body and face. Arm drift. EXAM: MRI HEAD WITHOUT CONTRAST TECHNIQUE: Multiplanar,  multiecho pulse sequences of the brain and surrounding structures were obtained without intravenous contrast. COMPARISON:  08/25/2014 FINDINGS: Calvarium and upper cervical spine: No focal marrow signal abnormality. Orbits: No significant findings. Sinuses and Mastoids: Clear. Brain: No acute infarct, hemorrhage, hydrocephalus, or mass lesion. No evidence of large vessel occlusion. Few hyperintense foci in the cerebral white matter are allowable for age; no indication of demyelinating process. Normal cerebral volume. IMPRESSION: Stable and unremarkable brain MRI. Electronically Signed   By: Monte Fantasia M.D.   On: 04/02/2015 18:32   Dg Hand Complete Right  04/02/2015  CLINICAL DATA:  Melinda Crutch to the right hand during a fall the night of 04/01/2015. Pain. Initial encounter. EXAM: RIGHT HAND - COMPLETE 3+ VIEW COMPARISON:  None. FINDINGS: The patient has a fracture through the neck of the fifth metacarpal with mild volar and radial angulation. Soft tissue swelling is present about the fracture. No other acute bony or joint abnormality is seen. Mild ulnar minus variance is noted. IMPRESSION: Acute fracture neck of the right fifth metacarpal. Electronically Signed  By: Inge Rise M.D.   On: 04/02/2015 16:22     PERTINENT LAB RESULTS: CBC:  Recent Labs  04/24/15 1555 04/25/15 0825  WBC 8.0 6.8  HGB 11.1* 10.3*  HCT 34.7* 31.7*  PLT 236 212   CMET CMP     Component Value Date/Time   NA 141 04/25/2015 0825   K 3.8 04/25/2015 0825   CL 105 04/25/2015 0825   CO2 27 04/25/2015 0825   GLUCOSE 194* 04/25/2015 0825   BUN 12 04/25/2015 0825   CREATININE 0.87 04/25/2015 0825   CREATININE 0.89 09/14/2014 0949   CALCIUM 8.8* 04/25/2015 0825   PROT 7.5 02/19/2015 1904   ALBUMIN 4.0 02/19/2015 1904   AST 16 02/19/2015 1904   ALT 16* 02/19/2015 1904   ALKPHOS 50 02/19/2015 1904   BILITOT 0.5 02/19/2015 1904   GFRNONAA >60 04/25/2015 0825   GFRNONAA >89 09/14/2014 0949   GFRAA >60  04/25/2015 0825   GFRAA >89 09/14/2014 0949    GFR Estimated Creatinine Clearance: 102.6 mL/min (by C-G formula based on Cr of 0.87). No results for input(s): LIPASE, AMYLASE in the last 72 hours.  Recent Labs  04/24/15 1555 04/24/15 1829 04/24/15 2049  TROPONINI <0.03 <0.03 <0.03   Invalid input(s): POCBNP No results for input(s): DDIMER in the last 72 hours.  Recent Labs  04/24/15 1555  HGBA1C 8.7*   No results for input(s): CHOL, HDL, LDLCALC, TRIG, CHOLHDL, LDLDIRECT in the last 72 hours. No results for input(s): TSH, T4TOTAL, T3FREE, THYROIDAB in the last 72 hours.  Invalid input(s): FREET3  Recent Labs  04/24/15 1555  VITAMINB12 431  FOLATE 19.6  FERRITIN 373*  TIBC 330  IRON 60  RETICCTPCT 0.6   Coags: No results for input(s): INR in the last 72 hours.  Invalid input(s): PT Microbiology: No results found for this or any previous visit (from the past 240 hour(s)).   BRIEF HOSPITAL COURSE:   Principal Problem: Chest pain: EKG stress cardiac enzymes were negative, patient was seen in consult by cardiology-underwent nuclear stress test which was negative. No further recommendations, continue aspirin on discharge. note no further chest pain since hospitalization.   Active Problems: Mild AKI: Secondary to dehydration, resolved with IV fluids. Continue-both lisinopril/HCTZ-close outpatient follow-up with PCP-please check electrolytes at next visit-to see if medications require further adjustment.  Intermittent hematochezia: FOBT negative on admission. No frank hematochezia since hospitalized. Per patient he has had intermittent hematochezia for the past one year-per patient he has a outpatient follow-up scheduled with gastroenterology in a few months-since this is a chronic issue-FOBT negative-suspect stable for discharge with close outpatient follow-up. Suspect internal hemorrhoids may be the culprit here-continue with hydrocortisone suppository. Have instructed  patient/family to seek immediate medical attention if hematochezia becomes heavy.  Anemia: Suspect secondary to chronic disease-repeat CBC at next visit   DM2 (diabetes mellitus, type 2): continue glipizide, Januvia and metformin. Further optimization deferred to the outpatient setting   HLD (hyperlipidemia): Continue with statin-further optimization deferred to the outpatient setting. Please check LDL in 3 months.  History of TIA: Continued aspirin and statin. Ensure patient keep appointment with neurology.  Hypertension: Controlled-continue with lisinopril, HCTZ and coreg discharge.   TODAY-DAY OF DISCHARGE:  Subjective:   Lochlann Mastrangelo today has no headache,no chest abdominal pain,no new weakness tingling or numbness, feels much better wants to go home today.   Objective:   Blood pressure 178/96, pulse 81, temperature 97.7 F (36.5 C), temperature source Oral, resp. rate 18, height 5\' 8"  (  1.727 m), weight 73.074 kg (161 lb 1.6 oz), SpO2 100 %.  Intake/Output Summary (Last 24 hours) at 04/25/15 1456 Last data filed at 04/25/15 1200  Gross per 24 hour  Intake   1775 ml  Output   1250 ml  Net    525 ml   Filed Weights   04/24/15 1510  Weight: 73.074 kg (161 lb 1.6 oz)    Exam Awake Alert, Oriented *3, No new F.N deficits, Normal affect [place].AT,PERRAL Supple Neck,No JVD, No cervical lymphadenopathy appriciated.  Symmetrical Chest wall movement, Good air movement bilaterally, CTAB RRR,No Gallops,Rubs or new Murmurs, No Parasternal Heave +ve B.Sounds, Abd Soft, Non tender, No organomegaly appriciated, No rebound -guarding or rigidity. No Cyanosis, Clubbing or edema, No new Rash or bruise  DISCHARGE CONDITION: Stable  DISPOSITION: Home  DISCHARGE INSTRUCTIONS:    Activity:  As tolerated  Get Medicines reviewed and adjusted: Please take all your medications with you for your next visit with your Primary MD  Please request your Primary MD to go over all hospital tests  and procedure/radiological results at the follow up, please ask your Primary MD to get all Hospital records sent to his/her office.  If you experience worsening of your admission symptoms, develop shortness of breath, life threatening emergency, suicidal or homicidal thoughts you must seek medical attention immediately by calling 911 or calling your MD immediately  if symptoms less severe.  You must read complete instructions/literature along with all the possible adverse reactions/side effects for all the Medicines you take and that have been prescribed to you. Take any new Medicines after you have completely understood and accpet all the possible adverse reactions/side effects.   Do not drive when taking Pain medications.   Do not take more than prescribed Pain, Sleep and Anxiety Medications  Special Instructions: If you have smoked or chewed Tobacco  in the last 2 yrs please stop smoking, stop any regular Alcohol  and or any Recreational drug use.  Wear Seat belts while driving.  Please note  You were cared for by a hospitalist during your hospital stay. Once you are discharged, your primary care physician will handle any further medical issues. Please note that NO REFILLS for any discharge medications will be authorized once you are discharged, as it is imperative that you return to your primary care physician (or establish a relationship with a primary care physician if you do not have one) for your aftercare needs so that they can reassess your need for medications and monitor your lab values.  Diet recommendation: Diabetic Diet Heart Healthy diet  Discharge Instructions    Call MD for:  persistant nausea and vomiting    Complete by:  As directed      Call MD for:  severe uncontrolled pain    Complete by:  As directed      Diet - low sodium heart healthy    Complete by:  As directed      Diet Carb Modified    Complete by:  As directed      Increase activity slowly    Complete by:   As directed            Follow-up Information    Follow up with Minerva Ends, MD. Schedule an appointment as soon as possible for a visit in 1 week.   Specialty:  Family Medicine   Contact information:   [place] [address] [phone]      Total Time spent  on discharge equals 25  minutes.  SignedOren Binet 04/25/2015 2:56 PM

## 2015-04-26 LAB — GLUCOSE, CAPILLARY: Glucose-Capillary: 143 mg/dL — ABNORMAL HIGH (ref 65–99)

## 2015-05-08 ENCOUNTER — Other Ambulatory Visit: Payer: Self-pay | Admitting: Internal Medicine

## 2015-05-15 ENCOUNTER — Other Ambulatory Visit: Payer: Self-pay | Admitting: Internal Medicine

## 2015-06-15 ENCOUNTER — Other Ambulatory Visit: Payer: Self-pay | Admitting: Internal Medicine

## 2015-06-15 DIAGNOSIS — E1149 Type 2 diabetes mellitus with other diabetic neurological complication: Secondary | ICD-10-CM

## 2015-06-15 MED ORDER — GLIPIZIDE 10 MG PO TABS: 10.0000 mg | ORAL_TABLET | Freq: Two times a day (BID) | ORAL | Status: DC

## 2015-07-04 ENCOUNTER — Encounter: Payer: Self-pay | Admitting: Family Medicine

## 2015-07-04 ENCOUNTER — Ambulatory Visit: Payer: Managed Care, Other (non HMO) | Attending: Family Medicine | Admitting: Family Medicine

## 2015-07-04 VITALS — BP 159/88 | HR 77 | Temp 97.9°F | Resp 18 | Ht 68.0 in | Wt 168.8 lb

## 2015-07-04 DIAGNOSIS — K922 Gastrointestinal hemorrhage, unspecified: Secondary | ICD-10-CM | POA: Diagnosis not present

## 2015-07-04 DIAGNOSIS — D649 Anemia, unspecified: Secondary | ICD-10-CM

## 2015-07-04 DIAGNOSIS — I10 Essential (primary) hypertension: Secondary | ICD-10-CM | POA: Diagnosis not present

## 2015-07-04 DIAGNOSIS — N529 Male erectile dysfunction, unspecified: Secondary | ICD-10-CM | POA: Insufficient documentation

## 2015-07-04 DIAGNOSIS — N521 Erectile dysfunction due to diseases classified elsewhere: Secondary | ICD-10-CM

## 2015-07-04 DIAGNOSIS — E1149 Type 2 diabetes mellitus with other diabetic neurological complication: Secondary | ICD-10-CM | POA: Diagnosis not present

## 2015-07-04 HISTORY — DX: Gastrointestinal hemorrhage, unspecified: K92.2

## 2015-07-04 LAB — GLUCOSE, POCT (MANUAL RESULT ENTRY): POC GLUCOSE: 145 mg/dL — AB (ref 70–99)

## 2015-07-04 MED ORDER — METFORMIN HCL 1000 MG PO TABS: 1000.0000 mg | ORAL_TABLET | Freq: Two times a day (BID) | ORAL | Status: DC

## 2015-07-04 MED ORDER — GLIPIZIDE 10 MG PO TABS: 10.0000 mg | ORAL_TABLET | Freq: Two times a day (BID) | ORAL | Status: DC

## 2015-07-04 MED ORDER — LISINOPRIL 40 MG PO TABS: 40.0000 mg | ORAL_TABLET | Freq: Every day | ORAL | Status: DC

## 2015-07-04 MED ORDER — FERROUS SULFATE 325 (65 FE) MG PO TABS
325.0000 mg | ORAL_TABLET | Freq: Two times a day (BID) | ORAL | Status: DC
Start: 2015-07-04 — End: 2015-08-18

## 2015-07-04 MED ORDER — AMLODIPINE BESYLATE 5 MG PO TABS: 5.0000 mg | ORAL_TABLET | Freq: Every day | ORAL | Status: DC

## 2015-07-04 MED FILL — FERROUS SULFATE 325 MG TAB: 325 (65 FE) | 30 days supply | Qty: 60 | Fill #0

## 2015-07-04 MED FILL — ATORVASTATIN 80 MG TABLET: 80 | 30 days supply | Qty: 30 | Fill #3

## 2015-07-04 MED FILL — ?CARVEDILOL 12.5 MG TABLET: 12.5 | 30 days supply | Qty: 60 | Fill #1

## 2015-07-04 MED FILL — LISINOPRIL 40 MG TABLET: 40 | 30 days supply | Qty: 30 | Fill #0

## 2015-07-04 MED FILL — ?OMEPRAZOLE DR 20 MG CAPSUL: 20 | 30 days supply | Qty: 30 | Fill #3

## 2015-07-04 MED FILL — ?AMLODIPINE BESYLATE 5 MG T: 5 | 30 days supply | Qty: 30 | Fill #0

## 2015-07-04 NOTE — Assessment & Plan Note (Signed)
CBGs at goal Removed januvia from med list F/u A1c in one month Continue current regimen

## 2015-07-04 NOTE — Assessment & Plan Note (Signed)
Remains above goal Med: compliant  D.c HCTZ and change to norvasc given ED Increase lisinopril from 20 mg to 40 mg daily

## 2015-07-04 NOTE — Patient Instructions (Addendum)
Shane [name] was seen today for hypertension, diabetes and anemia.  Diagnoses and all orders for this visit:  Type 2 diabetes mellitus with other neurologic complication ([place]) -     POCT glucose (manual entry) -     Ambulatory referral to Ophthalmology -     metFORMIN (GLUCOPHAGE) 1000 MG tablet; Take 1 tablet (1,000 mg total) by mouth 2 (two) times daily with a meal. -     glipiZIDE (GLUCOTROL) 10 MG tablet; Take 1 tablet (10 mg total) by mouth 2 (two) times daily before a meal.  Lower GI bleed -     CBC -     Cancel: Ambulatory referral to Gastroenterology -     ferrous sulfate 325 (65 FE) MG tablet; Take 1 tablet (325 mg total) by mouth 2 (two) times daily with a meal.  Essential hypertension -     Basic Metabolic Panel -     lisinopril (PRINIVIL,ZESTRIL) 40 MG tablet; Take 1 tablet (40 mg total) by mouth daily. -     amLODipine (NORVASC) 5 MG tablet; Take 1 tablet (5 mg total) by mouth daily.   Keep follow up with GI for next month Great job with sugars Stop HCTZ and replace with norvasc for BP/ED Increase lisinopril 40 mg  Call if you would like to try medications specific for erectile dysfunction  F/u in 4 weeks with pharmacist for A1c check and BP check F/u with me in 3 months for HTN and diabetes   Dr. Adrian Blackwater

## 2015-07-04 NOTE — Progress Notes (Signed)
Patient here for 3 month FU  Patient denies pain at this time.  Patient states he has had no thoughts of ending his life today. Patient admits to these thoughts being in the recent past and patient states he is now seeing a psychiatrist for these concerns.  Patient has not taken his medications today.  Patient would also like to know if he needs an iron supplement being he was diagnosed with Anemia in the ED.

## 2015-07-04 NOTE — Assessment & Plan Note (Signed)
Hx of LGIB without report of ongoing bleeding Patient to keep GI appt schedule for next month

## 2015-07-04 NOTE — Progress Notes (Signed)
Subjective:  Patient ID: Shane [name], male    DOB: [birth year]  Age: 45 y.o. MRN: [id number]  CC: Hypertension; Diabetes; and Anemia   HPI Samwise Wehri presents for   1. . CHRONIC HYPERTENSION  Disease Monitoring  Blood pressure range: not checking   Chest pain: no   Dyspnea: no   Claudication: no   Medication compliance: yes  Medication Side Effects  Lightheadedness: no   Urinary frequency: no   Edema: no   Impotence: yes    2. CHRONIC DIABETES  Disease Monitoring  Blood Sugar Ranges:  90-120s  Polyuria: no   Visual problems: yes   Medication Compliance: yes, except for januvia  Medication Side Effects  Hypoglycemia: no   Preventitive Health Care  Eye Exam: due   Foot Exam: done recently   Diet pattern: low carb   Exercise: minimal   3. Anemia: noted during last hospitalization. In setting of lower GI bleed. Patient denies blood per rectum. He admits to fatigue. GI f/u had to be rescheduled to next month due to recent snow.   Social History  Substance Use Topics  . Smoking status: Never Smoker   . Smokeless tobacco: Never Used  . Alcohol Use: No    Outpatient Prescriptions Prior to Visit  Medication Sig Dispense Refill  . aspirin 325 MG tablet Take 1 tablet (325 mg total) by mouth daily. 30 tablet 5  . atorvastatin (LIPITOR) 80 MG tablet Take 1 tablet (80 mg total) by mouth daily. (Patient taking differently: Take 80 mg by mouth daily at 6 PM. ) 30 tablet 12  . carvedilol (COREG) 12.5 MG tablet TAKE 1 TABLET BY MOUTH 2 TIMES DAILY WITH A MEAL 60 tablet 3  . divalproex (DEPAKOTE) 500 MG DR tablet Take 1 tablet (500 mg total) by mouth at bedtime. 30 tablet 0  . glipiZIDE (GLUCOTROL) 10 MG tablet Take 1 tablet (10 mg total) by mouth 2 (two) times daily before a meal. 60 tablet 3  . hydrochlorothiazide (MICROZIDE) 12.5 MG capsule TAKE 1 CAPSULE BY MOUTH DAILY. 30 capsule 2  . hydrocortisone (ANUSOL-HC) 25 MG suppository Place 1 suppository (25 mg total)  rectally 2 (two) times daily. (Patient taking differently: Place 25 mg rectally 2 (two) times daily as needed for hemorrhoids. ) 12 suppository 0  . metFORMIN (GLUCOPHAGE) 1000 MG tablet Take 1 tablet (1,000 mg total) by mouth 2 (two) times daily with a meal. 180 tablet 3  . omeprazole (PRILOSEC) 20 MG capsule Take 1 capsule (20 mg total) by mouth daily. 30 capsule 3  . sitaGLIPtin (JANUVIA) 100 MG tablet Take 1 tablet (100 mg total) by mouth daily. 30 tablet 11  . Vitamin D, Ergocalciferol, (DRISDOL) 50000 UNITS CAPS capsule Take 1 capsule (50,000 Units total) by mouth every 7 (seven) days. (Patient taking differently: Take 50,000 Units by mouth every 7 (seven) days. On Sundays) 12 capsule 0  . lisinopril (PRINIVIL,ZESTRIL) 20 MG tablet TAKE 2 TABLETS BY MOUTH DAILY. 60 tablet 1   No facility-administered medications prior to visit.    ROS Review of Systems  Constitutional: Negative for fever, chills, fatigue and unexpected weight change.  Eyes: Negative for visual disturbance.  Respiratory: Negative for cough and shortness of breath.   Cardiovascular: Negative for chest pain, palpitations and leg swelling.  Gastrointestinal: Negative for nausea, vomiting, abdominal pain, diarrhea, constipation, blood in stool, abdominal distention, anal bleeding and rectal pain.  Endocrine: Negative for polydipsia, polyphagia and polyuria.  Musculoskeletal: Negative for myalgias, back pain, arthralgias,  gait problem and neck pain.  Skin: Negative for rash.  Allergic/Immunologic: Negative for immunocompromised state.  Hematological: Negative for adenopathy. Does not bruise/bleed easily.  Psychiatric/Behavioral: Positive for dysphoric mood. Negative for suicidal ideas and sleep disturbance. The patient is not nervous/anxious.     Objective:  BP 159/88 mmHg  Pulse 77  Temp(Src) 97.9 F (36.6 C) (Oral)  Resp 18  Ht 5\' 8"  (1.727 m)  Wt 168 lb 12.8 oz (76.567 kg)  BMI 25.67 kg/m2  SpO2 100%  BP/Weight  07/04/2015 04/25/2015 [id number]  Systolic BP [id number] 0000000 -  Diastolic BP 88 88 -  Wt. (Lbs) 168.8 - 161.1  BMI 25.67 - 24.5   Physical Exam  Constitutional: He appears well-developed and well-nourished. No distress.  HENT:  Head: Normocephalic and atraumatic.  Neck: Normal range of motion. Neck supple.  Cardiovascular: Normal rate, regular rhythm, normal heart sounds and intact distal pulses.   Pulmonary/Chest: Effort normal and breath sounds normal.  Musculoskeletal: He exhibits no edema.  Neurological: He is alert.  Skin: Skin is warm and dry. No rash noted. No erythema.  Psychiatric: He has a normal mood and affect.   Lab Results  Component Value Date   HGBA1C 8.7* 04/24/2015   CBG 114  Assessment & Plan:   Problem List Items Addressed This Visit    Anemia (Chronic)   Relevant Medications   ferrous sulfate 325 (65 FE) MG tablet   Other Relevant Orders   CBC   DM2 (diabetes mellitus, type 2) (HCC) - Primary (Chronic)    CBGs at goal Removed januvia from med list F/u A1c in one month Continue current regimen       Relevant Medications   lisinopril (PRINIVIL,ZESTRIL) 40 MG tablet   metFORMIN (GLUCOPHAGE) 1000 MG tablet   glipiZIDE (GLUCOTROL) 10 MG tablet   Other Relevant Orders   POCT glucose (manual entry) (Completed)   Ambulatory referral to Ophthalmology   Erectile dysfunction   Essential hypertension (Chronic)    Remains above goal Med: compliant  D.c HCTZ and change to norvasc given ED Increase lisinopril from 20 mg to 40 mg daily       Relevant Medications   lisinopril (PRINIVIL,ZESTRIL) 40 MG tablet   amLODipine (NORVASC) 5 MG tablet   Other Relevant Orders   Basic Metabolic Panel   Lower GI bleed    Hx of LGIB without report of ongoing bleeding Patient to keep GI appt schedule for next month       Relevant Medications   ferrous sulfate 325 (65 FE) MG tablet   Other Relevant Orders   CBC      No orders of the defined types were placed in this  encounter.    Follow-up: No Follow-up on file.   Boykin Nearing MD

## 2015-07-05 LAB — CBC
HCT: 30.8 % — ABNORMAL LOW (ref 39.0–52.0)
Hemoglobin: 9.9 g/dL — ABNORMAL LOW (ref 13.0–17.0)
MCH: 29.8 pg (ref 26.0–34.0)
MCHC: 32.1 g/dL (ref 30.0–36.0)
MCV: 92.8 fL (ref 78.0–100.0)
MPV: 13.3 fL — ABNORMAL HIGH (ref 8.6–12.4)
PLATELETS: 206 10*3/uL (ref 150–400)
RBC: 3.32 MIL/uL — AB (ref 4.22–5.81)
RDW: 14.1 % (ref 11.5–15.5)
WBC: 7.5 10*3/uL (ref 4.0–10.5)

## 2015-07-05 LAB — BASIC METABOLIC PANEL
BUN: 34 mg/dL — ABNORMAL HIGH (ref 7–25)
CALCIUM: 9.1 mg/dL (ref 8.6–10.3)
CO2: 26 mmol/L (ref 20–31)
CREATININE: 0.96 mg/dL (ref 0.60–1.35)
Chloride: 106 mmol/L (ref 98–110)
GLUCOSE: 177 mg/dL — AB (ref 65–99)
Potassium: 4.7 mmol/L (ref 3.5–5.3)
SODIUM: 140 mmol/L (ref 135–146)

## 2015-07-12 ENCOUNTER — Encounter: Payer: Self-pay | Admitting: Occupational Therapy

## 2015-07-12 NOTE — Therapy (Signed)
[facility] [address] [place], [place], [address] Phone: [phone]   Fax:  [phone]  Patient Details  Name: Shane [name] MRN: [id number] Date of Birth: [birth year] Referring Provider:  No ref. provider found  Encounter Date: 07/12/2015   Will d/c episode of care at this time secondary to not returning to O.T. Services after initial evaluation and splinting on 04/13/15.  Carey Bullocks, OTR/L 07/12/2015, 9:16 AM  [facility] [address] [place], [place], [address] Phone: [phone]   Fax:  [phone]

## 2015-07-14 ENCOUNTER — Telehealth: Payer: Self-pay | Admitting: Family Medicine

## 2015-07-14 NOTE — Telephone Encounter (Signed)
Called patient Verified name and DOB Gave lab results and instructions to increase iron from current dose of once a day to twice a day

## 2015-07-14 NOTE — Telephone Encounter (Signed)
-----   Message from Boykin Nearing, MD sent at 07/06/2015  9:12 AM EST ----- Hgb lower at 9.9 Take iron Keep GI follow up

## 2015-07-15 ENCOUNTER — Encounter (HOSPITAL_COMMUNITY): Payer: Self-pay | Admitting: *Deleted

## 2015-07-15 ENCOUNTER — Emergency Department (HOSPITAL_COMMUNITY): Payer: Managed Care, Other (non HMO)

## 2015-07-15 ENCOUNTER — Emergency Department (HOSPITAL_COMMUNITY)
Admission: EM | Admit: 2015-07-15 | Discharge: 2015-07-15 | Disposition: A | Discharge status: Home / Self Care | Payer: Managed Care, Other (non HMO) | Attending: Emergency Medicine | Admitting: Emergency Medicine

## 2015-07-15 DIAGNOSIS — Z8673 Personal history of transient ischemic attack (TIA), and cerebral infarction without residual deficits: Secondary | ICD-10-CM | POA: Diagnosis not present

## 2015-07-15 DIAGNOSIS — Y9289 Other specified places as the place of occurrence of the external cause: Secondary | ICD-10-CM | POA: Insufficient documentation

## 2015-07-15 DIAGNOSIS — F419 Anxiety disorder, unspecified: Secondary | ICD-10-CM | POA: Diagnosis not present

## 2015-07-15 DIAGNOSIS — I5032 Chronic diastolic (congestive) heart failure: Secondary | ICD-10-CM | POA: Diagnosis not present

## 2015-07-15 DIAGNOSIS — Y998 Other external cause status: Secondary | ICD-10-CM | POA: Diagnosis not present

## 2015-07-15 DIAGNOSIS — Z7984 Long term (current) use of oral hypoglycemic drugs: Secondary | ICD-10-CM | POA: Insufficient documentation

## 2015-07-15 DIAGNOSIS — I10 Essential (primary) hypertension: Secondary | ICD-10-CM | POA: Insufficient documentation

## 2015-07-15 DIAGNOSIS — K219 Gastro-esophageal reflux disease without esophagitis: Secondary | ICD-10-CM | POA: Insufficient documentation

## 2015-07-15 DIAGNOSIS — Y9389 Activity, other specified: Secondary | ICD-10-CM | POA: Insufficient documentation

## 2015-07-15 DIAGNOSIS — S91112A Laceration without foreign body of left great toe without damage to nail, initial encounter: Secondary | ICD-10-CM | POA: Diagnosis not present

## 2015-07-15 DIAGNOSIS — X58XXXA Exposure to other specified factors, initial encounter: Secondary | ICD-10-CM | POA: Insufficient documentation

## 2015-07-15 DIAGNOSIS — E119 Type 2 diabetes mellitus without complications: Secondary | ICD-10-CM | POA: Insufficient documentation

## 2015-07-15 DIAGNOSIS — D649 Anemia, unspecified: Secondary | ICD-10-CM | POA: Insufficient documentation

## 2015-07-15 DIAGNOSIS — Z7982 Long term (current) use of aspirin: Secondary | ICD-10-CM | POA: Diagnosis not present

## 2015-07-15 DIAGNOSIS — F319 Bipolar disorder, unspecified: Secondary | ICD-10-CM | POA: Insufficient documentation

## 2015-07-15 DIAGNOSIS — E785 Hyperlipidemia, unspecified: Secondary | ICD-10-CM | POA: Diagnosis not present

## 2015-07-15 DIAGNOSIS — M79675 Pain in left toe(s): Secondary | ICD-10-CM

## 2015-07-15 LAB — CBC WITH DIFFERENTIAL/PLATELET
BASOS PCT: 0 %
Basophils Absolute: 0 10*3/uL (ref 0.0–0.1)
EOS ABS: 0.1 10*3/uL (ref 0.0–0.7)
EOS PCT: 2 %
HCT: 30.7 % — ABNORMAL LOW (ref 39.0–52.0)
HEMOGLOBIN: 9.9 g/dL — AB (ref 13.0–17.0)
LYMPHS ABS: 2 10*3/uL (ref 0.7–4.0)
Lymphocytes Relative: 30 %
MCH: 30.2 pg (ref 26.0–34.0)
MCHC: 32.2 g/dL (ref 30.0–36.0)
MCV: 93.6 fL (ref 78.0–100.0)
MONOS PCT: 4 %
Monocytes Absolute: 0.3 10*3/uL (ref 0.1–1.0)
NEUTROS PCT: 64 %
Neutro Abs: 4.3 10*3/uL (ref 1.7–7.7)
PLATELETS: 208 10*3/uL (ref 150–400)
RBC: 3.28 MIL/uL — ABNORMAL LOW (ref 4.22–5.81)
RDW: 13.8 % (ref 11.5–15.5)
WBC: 6.8 10*3/uL (ref 4.0–10.5)

## 2015-07-15 LAB — BASIC METABOLIC PANEL
ANION GAP: 6 (ref 5–15)
BUN: 19 mg/dL (ref 6–20)
CALCIUM: 9.1 mg/dL (ref 8.9–10.3)
CO2: 25 mmol/L (ref 22–32)
Chloride: 111 mmol/L (ref 101–111)
Creatinine, Ser: 0.8 mg/dL (ref 0.61–1.24)
Glucose, Bld: 269 mg/dL — ABNORMAL HIGH (ref 65–99)
Potassium: 4.5 mmol/L (ref 3.5–5.1)
Sodium: 142 mmol/L (ref 135–145)

## 2015-07-15 MED ORDER — OXYCODONE-ACETAMINOPHEN 5-325 MG PO TABS
1.0000 | ORAL_TABLET | Freq: Once | ORAL | Status: AC
  Administered 2015-07-15: 1 via ORAL

## 2015-07-15 MED ORDER — DOXYCYCLINE HYCLATE 100 MG PO CAPS: 100.0000 mg | ORAL_CAPSULE | Freq: Two times a day (BID) | ORAL | Status: DC

## 2015-07-15 MED ORDER — OXYCODONE-ACETAMINOPHEN 5-325 MG PO TABS
ORAL_TABLET | ORAL | Status: AC
  Filled 2015-07-15: qty 1

## 2015-07-15 NOTE — Discharge Instructions (Signed)
[name],  Nice meeting you! Please follow-up with your primary care provider regarding your diabetes medications and your foot wound. Return to the emergency department if you develop fevers, increased drainage, worsening pain. Feel better soon!  S. Wendie Simmer, PA-C

## 2015-07-15 NOTE — ED Provider Notes (Signed)
CSN: [id number]     Arrival date & time 07/15/15  1505 History   First MD Initiated Contact with Patient 07/15/15 1640     Chief Complaint  Patient presents with  . Toe Pain  . Wound Infection   HPI   Shane [name] is a 45 y.o. M PMH significant for HTN, TIA, DM, HLD, CHF, anxiety, bipolar disorder presenting with a 1 week history of left big toe wound. He endorses swelling, bruising, yellow green and light pink drainage. He denies fevers, chills, HA, CP, abdominal pain, N/V, leg pain, toe pain (cannot feel his toes due to neuropathy), inability to ambulate.   He is concerned because he was hospitalized for osteomyelitis of his left big toe about 3 years ago in [place]. They removed the nail due to infection.   Past Medical History  Diagnosis Date  . Hypertension     a. 08/2014 Admitted with hypertensive urgency.  . Chest pain     a. 2015 Reportedly normal stress test in [place].  Marland Kitchen TIA (transient ischemic attack) 08/2014; 03/2015    a. 08/2014 in setting of hypertensive urgency.  . Anemia   . Refusal of blood transfusions as patient is Jehovah's Witness   . Hyperlipidemia   . Chronic diastolic CHF (congestive heart failure) (HCC)     a.03/2015 Echo: EF 55-60%, Gr 1 DD, mild MR, triv PR.  . Type II diabetes mellitus ([place])   . Sleep apnea   . GERD (gastroesophageal reflux disease)   . Anxiety   . Depression   . Bipolar disorder [facility])    Past Surgical History  Procedure Laterality Date  . Inguinal hernia repair Bilateral ~ 1983- ~ 1986  . Tonsillectomy  ~ 1985   Family History  Problem Relation Age of Onset  . Hypertension Mother   . Diabetes Mother   . Hyperlipidemia Mother   . Heart disease Mother     s/p pacemaker  . Diabetes Father   . Hypertension Father   . Stroke Father   . Heart attack Father     first MI @ 74.  . Stroke Brother    Social History  Substance Use Topics  . Smoking status: Never Smoker   . Smokeless tobacco: Never Used  . Alcohol Use: No    Review  of Systems  Ten systems are reviewed and are negative for acute change except as noted in the HPI  Allergies  Eggs or egg-derived products; Other; and Milk-related compounds  Home Medications   Prior to Admission medications   Medication Sig Start Date End Date Taking? Authorizing Provider  amLODipine (NORVASC) 5 MG tablet Take 1 tablet (5 mg total) by mouth daily. 07/04/15  Yes Boykin Nearing, MD  aspirin 325 MG tablet Take 1 tablet (325 mg total) by mouth daily. 04/07/15  Yes Josalyn Funches, MD  atorvastatin (LIPITOR) 80 MG tablet Take 1 tablet (80 mg total) by mouth daily. 04/04/15  Yes Nita Sells, MD  carvedilol (COREG) 12.5 MG tablet TAKE 1 TABLET BY MOUTH 2 TIMES DAILY WITH A MEAL 05/17/15  Yes Josalyn Funches, MD  divalproex (DEPAKOTE) 500 MG DR tablet Take 1 tablet (500 mg total) by mouth at bedtime. 04/04/15  Yes Nita Sells, MD  ferrous sulfate 325 (65 FE) MG tablet Take 1 tablet (325 mg total) by mouth 2 (two) times daily with a meal. 07/04/15  Yes Josalyn Funches, MD  glipiZIDE (GLUCOTROL) 10 MG tablet Take 1 tablet (10 mg total) by mouth 2 (two) times  daily before a meal. 07/04/15  Yes Josalyn Funches, MD  ibuprofen (ADVIL,MOTRIN) 200 MG tablet Take 400 mg by mouth every 6 (six) hours as needed for headache (pain).   Yes Historical Provider, MD  lisinopril (PRINIVIL,ZESTRIL) 40 MG tablet Take 1 tablet (40 mg total) by mouth daily. 07/04/15  Yes Boykin Nearing, MD  metFORMIN (GLUCOPHAGE) 1000 MG tablet Take 1 tablet (1,000 mg total) by mouth 2 (two) times daily with a meal. 07/04/15  Yes Josalyn Funches, MD  omeprazole (PRILOSEC) 20 MG capsule Take 1 capsule (20 mg total) by mouth daily. 04/12/15  Yes Josalyn Funches, MD  Vitamin D, Ergocalciferol, (DRISDOL) 50000 UNITS CAPS capsule Take 1 capsule (50,000 Units total) by mouth every 7 (seven) days. Patient taking differently: Take 50,000 Units by mouth every 7 (seven) days. On Sundays 09/23/14  Yes Deepak Advani, MD    BP 124/86 mmHg  Pulse 73  Temp(Src) 98.2 F (36.8 C) (Oral)  Resp 18  SpO2 99% Physical Exam  Constitutional: He appears well-developed and well-nourished. No distress.  HENT:  Head: Normocephalic and atraumatic.  Mouth/Throat: Oropharynx is clear and moist. No oropharyngeal exudate.  Eyes: Conjunctivae are normal. Pupils are equal, round, and reactive to light. Right eye exhibits no discharge. Left eye exhibits no discharge. No scleral icterus.  Neck: No tracheal deviation present.  Cardiovascular: Normal rate, regular rhythm, normal heart sounds and intact distal pulses.  Exam reveals no gallop and no friction rub.   No murmur heard. Pulmonary/Chest: Effort normal and breath sounds normal. No respiratory distress. He has no wheezes. He has no rales. He exhibits no tenderness.  Abdominal: Soft. Bowel sounds are normal. He exhibits no distension and no mass. There is no tenderness. There is no rebound and no guarding.  Musculoskeletal: Normal range of motion. He exhibits no edema or tenderness.  Decreased sensation at lower extremities (chronic d/t neuropathy).   Lymphadenopathy:    He has no cervical adenopathy.  Neurological: He is alert. Coordination normal.  Skin: Skin is warm and dry. No rash noted. He is not diaphoretic. No erythema.  2.5 cm skin tear overlying left hallux nailbed. Underlying ecchymosis with congealed blood present. No purulent drainage.   Psychiatric: He has a normal mood and affect. His behavior is normal.  Nursing note and vitals reviewed.   ED Course  Procedures  Labs Review Labs Reviewed  CBC WITH DIFFERENTIAL/PLATELET - Abnormal; Notable for the following:    RBC 3.28 (*)    Hemoglobin 9.9 (*)    HCT 30.7 (*)    All other components within normal limits  BASIC METABOLIC PANEL - Abnormal; Notable for the following:    Glucose, Bld 269 (*)    All other components within normal limits    Imaging Review Dg Toe Great Left  07/15/2015  CLINICAL  DATA:  Left 8 toe wound for 1 week with pain. EXAM: LEFT GREAT TOE COMPARISON:  None. FINDINGS: Three views study shows soft tissue swelling of the great toe without underlying bony abnormality. Vascular calcifications suggest diabetes. There is a small bony erosion at the head of the first metatarsal which is probably chronic but correlation for overlying soft tissue infection recommended. IMPRESSION: No evidence for fracture or bony destruction in the phalanges the great toe. There is a cortical erosion in the head of the first metatarsal, likely chronic although with the soft tissue wound is in this region, osteomyelitis cannot be excluded. Electronically Signed   By: Misty Stanley M.D.   On:  07/15/2015 17:44   I have personally reviewed and evaluated these images and lab results as part of my medical decision-making.  MDM   Final diagnoses:  Pain of toe of left foot   Patient non-toxic appearing and VSS. Clinically unimpressive. Possibly ruptured blood blister. Patient isn't tachycardic, no white count. He's hyperglycemic at 269 but no purulent drainage noted. Plan to irrigate, apply bacitracin and d/c home with abx.  Consult ortho for xray review and possible follow-up in clinic.  Ortho evaluated xray and advised the cortical changes are more proximal than area of concern. Advised PCP follow-up.  Medications  oxyCODONE-acetaminophen (PERCOCET/ROXICET) 5-325 MG per tablet 1 tablet (1 tablet Oral Given 07/15/15 1527)   Patient feels improved after observation and/or treatment in ED.  Patient may be safely discharged home with  Discharge Medication List as of 07/15/2015  7:46 PM    START taking these medications   Details  doxycycline (VIBRAMYCIN) 100 MG capsule Take 1 capsule (100 mg total) by mouth 2 (two) times daily., Starting 07/15/2015, Until Discontinued, Print       Dr. Rex Kras evaluated patient as well and agrees with treatment plan.  Discussed reasons for return. Patient to  follow-up with primary care provider within one week. Stressed importance of glucose management. Patient in understanding and agreement with the plan.    Sadorus Lions, PA-C 07/17/15 [place], MD 07/18/15 [phone]

## 2015-07-15 NOTE — ED Notes (Signed)
Pt stable, ambulatory, states understanding of discharge instructions 

## 2015-07-15 NOTE — ED Notes (Signed)
Pt reports wound to left big toe x 1 week with pain, swelling and drainage.

## 2015-07-18 ENCOUNTER — Other Ambulatory Visit: Payer: Self-pay | Admitting: Family Medicine

## 2015-07-18 ENCOUNTER — Other Ambulatory Visit: Payer: Self-pay | Admitting: Internal Medicine

## 2015-07-19 ENCOUNTER — Other Ambulatory Visit: Payer: Self-pay | Admitting: Internal Medicine

## 2015-07-19 ENCOUNTER — Ambulatory Visit: Payer: Self-pay | Admitting: Neurology

## 2015-07-19 MED FILL — ?AMLODIPINE BESYLATE 5 MG T: 5 | 30 days supply | Qty: 30 | Fill #0

## 2015-07-28 ENCOUNTER — Emergency Department (HOSPITAL_COMMUNITY)
Admission: EM | Admit: 2015-07-28 | Discharge: 2015-07-28 | Disposition: A | Discharge status: Home / Self Care | Payer: Managed Care, Other (non HMO) | Attending: Emergency Medicine | Admitting: Emergency Medicine

## 2015-07-28 ENCOUNTER — Emergency Department (HOSPITAL_BASED_OUTPATIENT_CLINIC_OR_DEPARTMENT_OTHER)
Admit: 2015-07-28 | Discharge: 2015-07-28 | Disposition: A | Discharge status: Home / Self Care | Payer: Managed Care, Other (non HMO) | Attending: Emergency Medicine | Admitting: Emergency Medicine

## 2015-07-28 ENCOUNTER — Encounter (HOSPITAL_COMMUNITY): Payer: Self-pay | Admitting: Emergency Medicine

## 2015-07-28 ENCOUNTER — Emergency Department (HOSPITAL_COMMUNITY): Payer: Managed Care, Other (non HMO)

## 2015-07-28 DIAGNOSIS — M7989 Other specified soft tissue disorders: Secondary | ICD-10-CM

## 2015-07-28 DIAGNOSIS — L03032 Cellulitis of left toe: Secondary | ICD-10-CM | POA: Diagnosis not present

## 2015-07-28 DIAGNOSIS — I5032 Chronic diastolic (congestive) heart failure: Secondary | ICD-10-CM | POA: Insufficient documentation

## 2015-07-28 DIAGNOSIS — E785 Hyperlipidemia, unspecified: Secondary | ICD-10-CM | POA: Diagnosis not present

## 2015-07-28 DIAGNOSIS — F319 Bipolar disorder, unspecified: Secondary | ICD-10-CM | POA: Diagnosis not present

## 2015-07-28 DIAGNOSIS — Z7984 Long term (current) use of oral hypoglycemic drugs: Secondary | ICD-10-CM | POA: Insufficient documentation

## 2015-07-28 DIAGNOSIS — F419 Anxiety disorder, unspecified: Secondary | ICD-10-CM | POA: Diagnosis not present

## 2015-07-28 DIAGNOSIS — Z79899 Other long term (current) drug therapy: Secondary | ICD-10-CM | POA: Insufficient documentation

## 2015-07-28 DIAGNOSIS — Z792 Long term (current) use of antibiotics: Secondary | ICD-10-CM | POA: Diagnosis not present

## 2015-07-28 DIAGNOSIS — L089 Local infection of the skin and subcutaneous tissue, unspecified: Secondary | ICD-10-CM | POA: Diagnosis present

## 2015-07-28 DIAGNOSIS — I10 Essential (primary) hypertension: Secondary | ICD-10-CM | POA: Insufficient documentation

## 2015-07-28 DIAGNOSIS — Z8673 Personal history of transient ischemic attack (TIA), and cerebral infarction without residual deficits: Secondary | ICD-10-CM | POA: Diagnosis not present

## 2015-07-28 DIAGNOSIS — E119 Type 2 diabetes mellitus without complications: Secondary | ICD-10-CM | POA: Diagnosis not present

## 2015-07-28 DIAGNOSIS — M79609 Pain in unspecified limb: Secondary | ICD-10-CM

## 2015-07-28 DIAGNOSIS — K219 Gastro-esophageal reflux disease without esophagitis: Secondary | ICD-10-CM | POA: Diagnosis not present

## 2015-07-28 DIAGNOSIS — Z862 Personal history of diseases of the blood and blood-forming organs and certain disorders involving the immune mechanism: Secondary | ICD-10-CM | POA: Insufficient documentation

## 2015-07-28 LAB — CBC WITH DIFFERENTIAL/PLATELET
BASOS ABS: 0 10*3/uL (ref 0.0–0.1)
Basophils Relative: 0 %
EOS ABS: 0.1 10*3/uL (ref 0.0–0.7)
EOS PCT: 1 %
HCT: 26.5 % — ABNORMAL LOW (ref 39.0–52.0)
HEMOGLOBIN: 9 g/dL — AB (ref 13.0–17.0)
LYMPHS ABS: 1.9 10*3/uL (ref 0.7–4.0)
LYMPHS PCT: 17 %
MCH: 31.7 pg (ref 26.0–34.0)
MCHC: 34 g/dL (ref 30.0–36.0)
MCV: 93.3 fL (ref 78.0–100.0)
Monocytes Absolute: 0.8 10*3/uL (ref 0.1–1.0)
Monocytes Relative: 7 %
NEUTROS PCT: 75 %
Neutro Abs: 8.5 10*3/uL — ABNORMAL HIGH (ref 1.7–7.7)
PLATELETS: 271 10*3/uL (ref 150–400)
RBC: 2.84 MIL/uL — AB (ref 4.22–5.81)
RDW: 13 % (ref 11.5–15.5)
WBC: 11.4 10*3/uL — AB (ref 4.0–10.5)

## 2015-07-28 LAB — BASIC METABOLIC PANEL
ANION GAP: 9 (ref 5–15)
BUN: 21 mg/dL — AB (ref 6–20)
CHLORIDE: 104 mmol/L (ref 101–111)
CO2: 27 mmol/L (ref 22–32)
Calcium: 9 mg/dL (ref 8.9–10.3)
Creatinine, Ser: 0.94 mg/dL (ref 0.61–1.24)
GFR calc Af Amer: >60 mL/min (ref >60)
Glucose, Bld: 203 mg/dL — ABNORMAL HIGH (ref 65–99)
POTASSIUM: 4.5 mmol/L (ref 3.5–5.1)
SODIUM: 140 mmol/L (ref 135–145)

## 2015-07-28 MED ORDER — OXYCODONE-ACETAMINOPHEN 5-325 MG PO TABS
ORAL_TABLET | ORAL | Status: DC
Start: 2015-07-28 — End: 2015-07-28
  Filled 2015-07-28: qty 1

## 2015-07-28 MED ORDER — OXYCODONE-ACETAMINOPHEN 5-325 MG PO TABS
1.0000 | ORAL_TABLET | Freq: Once | ORAL | Status: AC
  Administered 2015-07-28: 1 via ORAL

## 2015-07-28 MED ORDER — OXYCODONE-ACETAMINOPHEN 5-325 MG PO TABS: 1.0000 | ORAL_TABLET | Freq: Four times a day (QID) | ORAL | Status: DC | PRN

## 2015-07-28 NOTE — Progress Notes (Signed)
*  PRELIMINARY RESULTS* Vascular Ultrasound Left lower extremity venous duplex has been completed.  Preliminary findings: No evidence of DVT or baker's cyst.    Landry Mellow, RDMS, RVT  07/28/2015, 3:51 PM

## 2015-07-28 NOTE — ED Notes (Signed)
Pt reports left great toe infection X 2days, hx of DM, no fever, no other complaints, NAD

## 2015-07-28 NOTE — ED Provider Notes (Signed)
CSN: [id number]     Arrival date & time 07/28/15  1112 History   First MD Initiated Contact with Patient 07/28/15 1331     Chief Complaint  Patient presents with  . Wound Infection     (Consider location/radiation/quality/duration/timing/severity/associated sxs/prior Treatment) Patient is a 45 y.o. male presenting with leg pain.  Leg Pain Location:  Leg Injury: no   Leg location:  L lower leg Pain details:    Quality:  Aching   Radiates to:  Does not radiate   Severity:  Mild   Onset quality:  Gradual   Timing:  Constant   Progression:  Worsening Chronicity:  New Dislocation: no   Prior injury to area:  No Relieved by:  None tried Worsened by:  Nothing tried Ineffective treatments:  None tried Associated symptoms: no back pain, no fever and no neck pain     Past Medical History  Diagnosis Date  . Hypertension     a. 08/2014 Admitted with hypertensive urgency.  . Chest pain     a. 2015 Reportedly normal stress test in [place].  Marland Kitchen TIA (transient ischemic attack) 08/2014; 03/2015    a. 08/2014 in setting of hypertensive urgency.  . Anemia   . Refusal of blood transfusions as patient is Jehovah's Witness   . Hyperlipidemia   . Chronic diastolic CHF (congestive heart failure) (HCC)     a.03/2015 Echo: EF 55-60%, Gr 1 DD, mild MR, triv PR.  . Type II diabetes mellitus ([place])   . Sleep apnea   . GERD (gastroesophageal reflux disease)   . Anxiety   . Depression   . Bipolar disorder [facility])    Past Surgical History  Procedure Laterality Date  . Inguinal hernia repair Bilateral ~ 1983- ~ 1986  . Tonsillectomy  ~ 1985   Family History  Problem Relation Age of Onset  . Hypertension Mother   . Diabetes Mother   . Hyperlipidemia Mother   . Heart disease Mother     s/p pacemaker  . Diabetes Father   . Hypertension Father   . Stroke Father   . Heart attack Father     first MI @ 56.  . Stroke Brother    Social History  Substance Use Topics  . Smoking status: Never Smoker     . Smokeless tobacco: Never Used  . Alcohol Use: No    Review of Systems  Constitutional: Negative for fever.  Eyes: Negative for pain.  Respiratory: Negative for cough and shortness of breath.   Gastrointestinal: Negative for abdominal pain.  Musculoskeletal: Negative for back pain and neck pain.  All other systems reviewed and are negative.     Allergies  Eggs or egg-derived products; Other; and Milk-related compounds  Home Medications   Prior to Admission medications   Medication Sig Start Date End Date Taking? Authorizing Provider  amLODipine (NORVASC) 5 MG tablet TAKE 1 TABLET BY MOUTH DAILY 07/18/15  Yes Josalyn Funches, MD  atorvastatin (LIPITOR) 80 MG tablet Take 1 tablet (80 mg total) by mouth daily. Patient taking differently: Take 80 mg by mouth every morning.  04/04/15  Yes Nita Sells, MD  carvedilol (COREG) 12.5 MG tablet TAKE 1 TABLET BY MOUTH 2 TIMES DAILY WITH A MEAL 05/17/15  Yes Josalyn Funches, MD  divalproex (DEPAKOTE) 500 MG DR tablet Take 1 tablet (500 mg total) by mouth at bedtime. 04/04/15  Yes Nita Sells, MD  ferrous sulfate 325 (65 FE) MG tablet Take 1 tablet (325 mg total) by mouth  2 (two) times daily with a meal. 07/04/15  Yes Josalyn Funches, MD  glipiZIDE (GLUCOTROL) 10 MG tablet Take 1 tablet (10 mg total) by mouth 2 (two) times daily before a meal. 07/04/15  Yes Josalyn Funches, MD  ibuprofen (ADVIL,MOTRIN) 200 MG tablet Take 400 mg by mouth every 6 (six) hours as needed for headache (pain).   Yes Historical Provider, MD  lisinopril (PRINIVIL,ZESTRIL) 40 MG tablet Take 1 tablet (40 mg total) by mouth daily. 07/04/15  Yes Boykin Nearing, MD  metFORMIN (GLUCOPHAGE) 1000 MG tablet Take 1 tablet (1,000 mg total) by mouth 2 (two) times daily with a meal. 07/04/15  Yes Josalyn Funches, MD  omeprazole (PRILOSEC) 20 MG capsule Take 1 capsule (20 mg total) by mouth daily. 04/12/15  Yes Josalyn Funches, MD  Vitamin D, Ergocalciferol, (DRISDOL)  50000 UNITS CAPS capsule Take 1 capsule (50,000 Units total) by mouth every 7 (seven) days. Patient taking differently: Take 50,000 Units by mouth every 7 (seven) days. On Sundays 09/23/14  Yes Lorayne Marek, MD  aspirin 325 MG tablet Take 1 tablet (325 mg total) by mouth daily. Patient not taking: Reported on 07/28/2015 04/07/15   Boykin Nearing, MD  doxycycline (VIBRAMYCIN) 100 MG capsule Take 1 capsule (100 mg total) by mouth 2 (two) times daily. 07/15/15   Elrosa Lions, PA-C  oxyCODONE-acetaminophen (PERCOCET/ROXICET) 5-325 MG tablet Take 1 tablet by mouth every 6 (six) hours as needed for severe pain. 07/28/15   Merrily Pew, MD   BP 133/77 mmHg  Pulse 86  Temp(Src) 98.5 F (36.9 C) (Oral)  Resp 16  Ht 5\' 8"  (1.727 m)  Wt 162 lb (73.483 kg)  BMI 24.64 kg/m2  SpO2 97% Physical Exam  Constitutional: He is oriented to person, place, and time. He appears well-developed and well-nourished.  HENT:  Head: Normocephalic and atraumatic.  Neck: Normal range of motion.  Cardiovascular: Normal rate.   Pulmonary/Chest: Effort normal. No respiratory distress.  Abdominal: He exhibits no distension.  Musculoskeletal: Normal range of motion. He exhibits edema (LLE).  Neurological: He is alert and oriented to person, place, and time.  Skin: Rash noted.  Nursing note and vitals reviewed.      ED Course  Procedures (including critical care time) Labs Review Labs Reviewed  CBC WITH DIFFERENTIAL/PLATELET - Abnormal; Notable for the following:    WBC 11.4 (*)    RBC 2.84 (*)    Hemoglobin 9.0 (*)    HCT 26.5 (*)    Neutro Abs 8.5 (*)    All other components within normal limits  BASIC METABOLIC PANEL - Abnormal; Notable for the following:    Glucose, Bld 203 (*)    BUN 21 (*)    All other components within normal limits    Imaging Review Dg Foot Complete Left  07/28/2015  CLINICAL DATA:  Left great toe infection over the past 2 days. Diabetes. EXAM: LEFT FOOT - COMPLETE 3+ VIEW  COMPARISON:  07/15/2015 FINDINGS: Small erosion or degenerative subcortical cystic lesion medially along the distal first metatarsal head. Atherosclerotic vascular calcifications. There is an oblique fracture extending from the medial portion of the distal metaphysis to the distal articular surface of the proximal phalanx of the small toe. No malalignment at the Lisfranc joint. Os trigonum noted clear. Achilles calcaneal spur. Dorsal spurring of the talonavicular articulation. IMPRESSION: 1. Subcortical cyst or gouty erosion of the medial margin of the head of the first metatarsal, no change from 07/15/2015. This would be unlikely to be due to  osteomyelitis based on the appearance. 2. I suspect oblique fracture of the distal portion of the proximal phalanx of the small toe, extending into the interphalangeal joint. Electronically Signed   By: Van Clines M.D.   On: 07/28/2015 14:29   I have personally reviewed and evaluated these images and lab results as part of my medical decision-making.   EKG Interpretation None      MDM   Final diagnoses:  Cellulitis of toe of left foot   45 year old male who is also started antibiotics a week ago for cellulitis of his left lower extremity but did not progressively worsening symptoms since that time and the wound that was there is not healed. Examination he has some denuded area underneath what appears to be an old blister with a little bit of granulation tissue. It is open but there is nothing draining out of it at this time. Minimal erythema redness around it. However patient does have left lower show any swelling greater than right so we'll get a DVT study to evaluate for a blood clot.  45 year old male likely has cellulitis. Has doxycycline at home which will continue. I'll give her referral to podiatry and asked to follow-up with his primary doctor as well. No evidence of DVT on his ultrasound however with consistent symptoms may need a repeat in a  week.    Merrily Pew, MD 07/28/15 [phone]

## 2015-07-31 ENCOUNTER — Ambulatory Visit: Payer: Self-pay | Admitting: Neurology

## 2015-08-01 ENCOUNTER — Encounter (HOSPITAL_COMMUNITY): Payer: Self-pay | Admitting: Neurology

## 2015-08-01 ENCOUNTER — Emergency Department (HOSPITAL_COMMUNITY): Payer: Managed Care, Other (non HMO)

## 2015-08-01 ENCOUNTER — Encounter: Payer: Self-pay | Admitting: Neurology

## 2015-08-01 ENCOUNTER — Inpatient Hospital Stay (HOSPITAL_COMMUNITY)
Admission: EM | Admit: 2015-08-01 | Discharge: 2015-08-04 | DRG: 041 | Disposition: A | Discharge status: Home / Self Care | Payer: Managed Care, Other (non HMO) | Attending: Internal Medicine | Admitting: Internal Medicine

## 2015-08-01 DIAGNOSIS — F319 Bipolar disorder, unspecified: Secondary | ICD-10-CM | POA: Diagnosis present

## 2015-08-01 DIAGNOSIS — Z8249 Family history of ischemic heart disease and other diseases of the circulatory system: Secondary | ICD-10-CM | POA: Diagnosis not present

## 2015-08-01 DIAGNOSIS — D649 Anemia, unspecified: Secondary | ICD-10-CM | POA: Diagnosis present

## 2015-08-01 DIAGNOSIS — M86672 Other chronic osteomyelitis, left ankle and foot: Secondary | ICD-10-CM | POA: Diagnosis present

## 2015-08-01 DIAGNOSIS — E785 Hyperlipidemia, unspecified: Secondary | ICD-10-CM | POA: Diagnosis present

## 2015-08-01 DIAGNOSIS — E1140 Type 2 diabetes mellitus with diabetic neuropathy, unspecified: Principal | ICD-10-CM | POA: Diagnosis present

## 2015-08-01 DIAGNOSIS — E1142 Type 2 diabetes mellitus with diabetic polyneuropathy: Secondary | ICD-10-CM | POA: Diagnosis present

## 2015-08-01 DIAGNOSIS — L089 Local infection of the skin and subcutaneous tissue, unspecified: Secondary | ICD-10-CM | POA: Diagnosis not present

## 2015-08-01 DIAGNOSIS — L97524 Non-pressure chronic ulcer of other part of left foot with necrosis of bone: Secondary | ICD-10-CM | POA: Diagnosis present

## 2015-08-01 DIAGNOSIS — Z833 Family history of diabetes mellitus: Secondary | ICD-10-CM | POA: Diagnosis not present

## 2015-08-01 DIAGNOSIS — I10 Essential (primary) hypertension: Secondary | ICD-10-CM | POA: Diagnosis not present

## 2015-08-01 DIAGNOSIS — E1169 Type 2 diabetes mellitus with other specified complication: Secondary | ICD-10-CM | POA: Diagnosis present

## 2015-08-01 DIAGNOSIS — L02612 Cutaneous abscess of left foot: Secondary | ICD-10-CM | POA: Diagnosis present

## 2015-08-01 DIAGNOSIS — L03116 Cellulitis of left lower limb: Secondary | ICD-10-CM | POA: Diagnosis present

## 2015-08-01 DIAGNOSIS — R609 Edema, unspecified: Secondary | ICD-10-CM | POA: Diagnosis not present

## 2015-08-01 DIAGNOSIS — I5032 Chronic diastolic (congestive) heart failure: Secondary | ICD-10-CM | POA: Diagnosis present

## 2015-08-01 DIAGNOSIS — Z79899 Other long term (current) drug therapy: Secondary | ICD-10-CM

## 2015-08-01 DIAGNOSIS — K219 Gastro-esophageal reflux disease without esophagitis: Secondary | ICD-10-CM | POA: Diagnosis present

## 2015-08-01 DIAGNOSIS — L02619 Cutaneous abscess of unspecified foot: Secondary | ICD-10-CM

## 2015-08-01 DIAGNOSIS — E11628 Type 2 diabetes mellitus with other skin complications: Secondary | ICD-10-CM | POA: Diagnosis present

## 2015-08-01 DIAGNOSIS — Z7984 Long term (current) use of oral hypoglycemic drugs: Secondary | ICD-10-CM | POA: Diagnosis not present

## 2015-08-01 DIAGNOSIS — R52 Pain, unspecified: Secondary | ICD-10-CM

## 2015-08-01 DIAGNOSIS — Z91018 Allergy to other foods: Secondary | ICD-10-CM

## 2015-08-01 DIAGNOSIS — F419 Anxiety disorder, unspecified: Secondary | ICD-10-CM | POA: Diagnosis present

## 2015-08-01 DIAGNOSIS — L97529 Non-pressure chronic ulcer of other part of left foot with unspecified severity: Secondary | ICD-10-CM | POA: Diagnosis present

## 2015-08-01 DIAGNOSIS — M869 Osteomyelitis, unspecified: Secondary | ICD-10-CM | POA: Diagnosis not present

## 2015-08-01 DIAGNOSIS — E119 Type 2 diabetes mellitus without complications: Secondary | ICD-10-CM

## 2015-08-01 DIAGNOSIS — Z8673 Personal history of transient ischemic attack (TIA), and cerebral infarction without residual deficits: Secondary | ICD-10-CM | POA: Diagnosis not present

## 2015-08-01 LAB — CBC
HEMATOCRIT: 27.4 % — AB (ref 39.0–52.0)
Hemoglobin: 9 g/dL — ABNORMAL LOW (ref 13.0–17.0)
MCH: 30.9 pg (ref 26.0–34.0)
MCHC: 32.8 g/dL (ref 30.0–36.0)
MCV: 94.2 fL (ref 78.0–100.0)
Platelets: 327 10*3/uL (ref 150–400)
RBC: 2.91 MIL/uL — ABNORMAL LOW (ref 4.22–5.81)
RDW: 13.1 % (ref 11.5–15.5)
WBC: 9 10*3/uL (ref 4.0–10.5)

## 2015-08-01 LAB — BASIC METABOLIC PANEL
ANION GAP: 9 (ref 5–15)
BUN: 17 mg/dL (ref 6–20)
CALCIUM: 9.1 mg/dL (ref 8.9–10.3)
CO2: 28 mmol/L (ref 22–32)
Chloride: 106 mmol/L (ref 101–111)
Creatinine, Ser: 0.94 mg/dL (ref 0.61–1.24)
GFR calc Af Amer: >60 mL/min (ref >60)
GLUCOSE: 183 mg/dL — AB (ref 65–99)
POTASSIUM: 4.4 mmol/L (ref 3.5–5.1)
SODIUM: 143 mmol/L (ref 135–145)

## 2015-08-01 LAB — BRAIN NATRIURETIC PEPTIDE: B NATRIURETIC PEPTIDE 5: 34.7 pg/mL (ref 0.0–100.0)

## 2015-08-01 LAB — CBG MONITORING, ED: GLUCOSE-CAPILLARY: 170 mg/dL — AB (ref 65–99)

## 2015-08-01 MED ORDER — SODIUM CHLORIDE 0.9 % IV BOLUS (SEPSIS)
1000.0000 mL | Freq: Once | INTRAVENOUS | Status: AC
  Administered 2015-08-01: 1000 mL via INTRAVENOUS

## 2015-08-01 MED ORDER — VANCOMYCIN HCL IN DEXTROSE 1-5 GM/200ML-% IV SOLN
1000.0000 mg | Freq: Once | INTRAVENOUS | Status: AC
  Administered 2015-08-01: 1000 mg via INTRAVENOUS
  Filled 2015-08-01: qty 200

## 2015-08-01 MED ORDER — PIPERACILLIN-TAZOBACTAM 3.375 G IVPB
3.3750 g | Freq: Once | INTRAVENOUS | Status: AC
  Administered 2015-08-01: 3.375 g via INTRAVENOUS
  Filled 2015-08-01: qty 50

## 2015-08-01 NOTE — ED Provider Notes (Addendum)
CSN: [id number]     Arrival date & time 08/01/15  1620 History   First MD Initiated Contact with Patient 08/01/15 1953     Chief Complaint  Patient presents with  . Foot Pain     (Consider location/radiation/quality/duration/timing/severity/associated sxs/prior Treatment) HPI Patient presents with left great toe and left lower extremity swelling. Patient has had known diabetic ulcer to the toe and has been taking doxycycline for the past 4 days. There've been no improvement. Patient has purulent discharge from the tip of the toe. Admits to nausea but denies any fever or chills. Patient also noticed swelling to the right lower extremity that started yesterday. Denies shortness of breath or chest pain. Past Medical History  Diagnosis Date  . Hypertension     a. 08/2014 Admitted with hypertensive urgency.  . Chest pain     a. 2015 Reportedly normal stress test in [place].  Marland Kitchen TIA (transient ischemic attack) 08/2014; 03/2015    a. 08/2014 in setting of hypertensive urgency.  . Anemia   . Refusal of blood transfusions as patient is Jehovah's Witness   . Hyperlipidemia   . Chronic diastolic CHF (congestive heart failure) (HCC)     a.03/2015 Echo: EF 55-60%, Gr 1 DD, mild MR, triv PR.  . Type II diabetes mellitus ([place])   . Sleep apnea   . GERD (gastroesophageal reflux disease)   . Anxiety   . Depression   . Bipolar disorder [facility])    Past Surgical History  Procedure Laterality Date  . Inguinal hernia repair Bilateral ~ 1983- ~ 1986  . Tonsillectomy  ~ 1985   Family History  Problem Relation Age of Onset  . Hypertension Mother   . Diabetes Mother   . Hyperlipidemia Mother   . Heart disease Mother     s/p pacemaker  . Diabetes Father   . Hypertension Father   . Stroke Father   . Heart attack Father     first MI @ 56.  . Stroke Brother    Social History  Substance Use Topics  . Smoking status: Never Smoker   . Smokeless tobacco: Never Used  . Alcohol Use: No    Review of Systems   Constitutional: Negative for fever and chills.  Respiratory: Negative for cough and shortness of breath.   Cardiovascular: Negative for chest pain.  Gastrointestinal: Positive for nausea. Negative for vomiting, abdominal pain and abdominal distention.  Musculoskeletal: Negative for myalgias, back pain, neck pain and neck stiffness.  Skin: Positive for wound. Negative for rash.  Neurological: Negative for dizziness, weakness, light-headedness, numbness and headaches.  All other systems reviewed and are negative.     Allergies  Eggs or egg-derived products; Other; and Milk-related compounds  Home Medications   Prior to Admission medications   Medication Sig Start Date End Date Taking? Authorizing Provider  amLODipine (NORVASC) 5 MG tablet TAKE 1 TABLET BY MOUTH DAILY 07/18/15  Yes Josalyn Funches, MD  atorvastatin (LIPITOR) 80 MG tablet Take 1 tablet (80 mg total) by mouth daily. Patient taking differently: Take 80 mg by mouth every morning.  04/04/15  Yes Nita Sells, MD  carvedilol (COREG) 12.5 MG tablet TAKE 1 TABLET BY MOUTH 2 TIMES DAILY WITH A MEAL 05/17/15  Yes Josalyn Funches, MD  divalproex (DEPAKOTE) 500 MG DR tablet Take 1 tablet (500 mg total) by mouth at bedtime. 04/04/15  Yes Nita Sells, MD  ferrous sulfate 325 (65 FE) MG tablet Take 1 tablet (325 mg total) by mouth 2 (two) times  daily with a meal. 07/04/15  Yes Josalyn Funches, MD  glipiZIDE (GLUCOTROL) 10 MG tablet Take 1 tablet (10 mg total) by mouth 2 (two) times daily before a meal. 07/04/15  Yes Josalyn Funches, MD  ibuprofen (ADVIL,MOTRIN) 200 MG tablet Take 400 mg by mouth every 6 (six) hours as needed for headache (pain).   Yes Historical Provider, MD  lisinopril (PRINIVIL,ZESTRIL) 40 MG tablet Take 1 tablet (40 mg total) by mouth daily. 07/04/15  Yes Boykin Nearing, MD  metFORMIN (GLUCOPHAGE) 1000 MG tablet Take 1 tablet (1,000 mg total) by mouth 2 (two) times daily with a meal. 07/04/15  Yes Josalyn  Funches, MD  omeprazole (PRILOSEC) 20 MG capsule Take 1 capsule (20 mg total) by mouth daily. 04/12/15  Yes Josalyn Funches, MD  oxyCODONE-acetaminophen (PERCOCET/ROXICET) 5-325 MG tablet Take 1 tablet by mouth every 6 (six) hours as needed for severe pain. 07/28/15  Yes Merrily Pew, MD  Vitamin D, Ergocalciferol, (DRISDOL) 50000 UNITS CAPS capsule Take 1 capsule (50,000 Units total) by mouth every 7 (seven) days. Patient taking differently: Take 50,000 Units by mouth every 7 (seven) days. On Sundays 09/23/14  Yes Deepak Advani, MD   BP 151/95 mmHg  Pulse 85  Temp(Src) 97.6 F (36.4 C) (Oral)  Resp 18  Wt 163 lb (73.936 kg)  SpO2 100% Physical Exam  Constitutional: He is oriented to person, place, and time. He appears well-developed and well-nourished. No distress.  HENT:  Head: Normocephalic and atraumatic.  Mouth/Throat: Oropharynx is clear and moist.  Eyes: EOM are normal. Pupils are equal, round, and reactive to light.  Neck: Normal range of motion. Neck supple.  Cardiovascular: Normal rate and regular rhythm.   Pulmonary/Chest: Effort normal and breath sounds normal. No respiratory distress. He has no wheezes. He has no rales. He exhibits no tenderness.  Abdominal: Soft. Bowel sounds are normal. He exhibits no distension and no mass. There is no tenderness. There is no rebound and no guarding.  Musculoskeletal: Normal range of motion. He exhibits edema. He exhibits no tenderness.  Patient with fluctuant mass to the tip of the great toe of the left foot. There is mild surrounding erythema. There is 3+ pitting edema up to the knee. Patient has 1+ pitting edema to the right lower extremity. Distal pulses intact.  Neurological: He is alert and oriented to person, place, and time.  5/5 motor in all extremities. Patient is insensate over the great toe of the left foot.  Skin: Skin is warm and dry. No rash noted. No erythema.  Psychiatric: He has a normal mood and affect. His behavior is  normal.  Nursing note and vitals reviewed.   ED Course  Procedures (including critical care time) Labs Review Labs Reviewed  BASIC METABOLIC PANEL - Abnormal; Notable for the following:    Glucose, Bld 183 (*)    All other components within normal limits  CBC - Abnormal; Notable for the following:    RBC 2.91 (*)    Hemoglobin 9.0 (*)    HCT 27.4 (*)    All other components within normal limits  CBG MONITORING, ED - Abnormal; Notable for the following:    Glucose-Capillary 170 (*)    All other components within normal limits  BRAIN NATRIURETIC PEPTIDE    Imaging Review Dg Foot Complete Left  08/01/2015  CLINICAL DATA:  45 year old male with abscess of the great toe. EXAM: LEFT FOOT - COMPLETE 3+ VIEW COMPARISON:  Radiograph dated 07/28/2015 go FINDINGS: Stable appearing linear lucency with  distal portion of the proximal phalanx of the fifth digit may represent an age indeterminate fracture. No new fracture identified. There is diffuse soft tissue swelling of the foot. No radiopaque foreign object or soft tissue gas. IMPRESSION: Diffuse soft tissue swelling of the foot. No radiopaque foreign object or soft tissue gas. Electronically Signed   By: Anner Crete M.D.   On: 08/01/2015 20:57   I have personally reviewed and evaluated these images and lab results as part of my medical decision-making.   EKG Interpretation None      MDM   Final diagnoses:  Diabetic infection of left foot ([place])   Abscess was incised with production of some purulence. Will start on broad spectrum antibiotics and get x-ray. Suspicion for osteomyelitis. With Dr.Kakrakandy and will admt to medsurg bed   Julianne Rice, MD 08/01/15 2221  Julianne Rice, MD 08/01/15 2221

## 2015-08-01 NOTE — ED Notes (Signed)
Pt reports left foot swelling and wound to left great toe. Is a diabetic, is taking doxycycline. Is ambulatory. Afebrile 97.6

## 2015-08-01 NOTE — H&P (Signed)
[facility] History and Physical  Shane [name] [id number] DOB: January 07, 1971 DOA: 08/01/2015  Referring physician: Dr. Lita Mains. PCP: Minerva Ends, MD  Specialists: None.  Chief Complaint: Left great toe pain and swelling.  HPI: Shane [name] is a 45 y.o. male with history of diabetes mellitus type 2, hypertension, TIA and bipolar disorder presents to the ER because of worsening swelling of the left great toe and foot. Patient has been having these symptoms over last week and a half. Patient had come to the ER last week and was prescribed doxycycline despite taking which patients swelling worsened with discharge. In the ER here for lesion had slightly last the left great toe and had put on discharge. X-rays shows soft tissue swelling. Patient has been admitted for further management of left foot cellulitis with abscess. Patient has decreased sensation in the lower extremity is. Also has noticed increasing swelling. Denies any fever or chills.   Review of Systems: As presented in the history of presenting illness, rest negative.  Past Medical History  Diagnosis Date  . Hypertension     a. 08/2014 Admitted with hypertensive urgency.  . Chest pain     a. 2015 Reportedly normal stress test in [place].  Marland Kitchen TIA (transient ischemic attack) 08/2014; 03/2015    a. 08/2014 in setting of hypertensive urgency.  . Anemia   . Refusal of blood transfusions as patient is Jehovah's Witness   . Hyperlipidemia   . Chronic diastolic CHF (congestive heart failure) (HCC)     a.03/2015 Echo: EF 55-60%, Gr 1 DD, mild MR, triv PR.  . Type II diabetes mellitus ([place])   . Sleep apnea   . GERD (gastroesophageal reflux disease)   . Anxiety   . Depression   . Bipolar disorder [facility])    Past Surgical History  Procedure Laterality Date  . Inguinal hernia repair Bilateral ~ 1983- ~ 1986  . Tonsillectomy  ~ 1985   Social History:  reports that he has never smoked. He has never used smokeless tobacco. He  reports that he does not drink alcohol or use illicit drugs. Where does patient live home. Can patient participate in ADLs? Yes.  Allergies  Allergen Reactions  . Eggs Or Egg-Derived Products Diarrhea  . Other Other (See Comments)    Red meat causes stomach pains, bloating and diarrhea  . Milk-Related Compounds Diarrhea and Other (See Comments)    Any dairy products  - diarrhea and bloating    Family History:  Family History  Problem Relation Age of Onset  . Hypertension Mother   . Diabetes Mother   . Hyperlipidemia Mother   . Heart disease Mother     s/p pacemaker  . Diabetes Father   . Hypertension Father   . Stroke Father   . Heart attack Father     first MI @ 54.  . Stroke Brother       Prior to Admission medications   Medication Sig Start Date End Date Taking? Authorizing Provider  amLODipine (NORVASC) 5 MG tablet TAKE 1 TABLET BY MOUTH DAILY 07/18/15  Yes Josalyn Funches, MD  atorvastatin (LIPITOR) 80 MG tablet Take 1 tablet (80 mg total) by mouth daily. Patient taking differently: Take 80 mg by mouth every morning.  04/04/15  Yes Nita Sells, MD  carvedilol (COREG) 12.5 MG tablet TAKE 1 TABLET BY MOUTH 2 TIMES DAILY WITH A MEAL 05/17/15  Yes Josalyn Funches, MD  divalproex (DEPAKOTE) 500 MG DR tablet Take 1 tablet (500 mg total) by  mouth at bedtime. 04/04/15  Yes Nita Sells, MD  ferrous sulfate 325 (65 FE) MG tablet Take 1 tablet (325 mg total) by mouth 2 (two) times daily with a meal. 07/04/15  Yes Josalyn Funches, MD  glipiZIDE (GLUCOTROL) 10 MG tablet Take 1 tablet (10 mg total) by mouth 2 (two) times daily before a meal. 07/04/15  Yes Josalyn Funches, MD  ibuprofen (ADVIL,MOTRIN) 200 MG tablet Take 400 mg by mouth every 6 (six) hours as needed for headache (pain).   Yes Historical Provider, MD  lisinopril (PRINIVIL,ZESTRIL) 40 MG tablet Take 1 tablet (40 mg total) by mouth daily. 07/04/15  Yes Boykin Nearing, MD  metFORMIN (GLUCOPHAGE) 1000 MG tablet  Take 1 tablet (1,000 mg total) by mouth 2 (two) times daily with a meal. 07/04/15  Yes Josalyn Funches, MD  omeprazole (PRILOSEC) 20 MG capsule Take 1 capsule (20 mg total) by mouth daily. 04/12/15  Yes Josalyn Funches, MD  oxyCODONE-acetaminophen (PERCOCET/ROXICET) 5-325 MG tablet Take 1 tablet by mouth every 6 (six) hours as needed for severe pain. 07/28/15  Yes Merrily Pew, MD  Vitamin D, Ergocalciferol, (DRISDOL) 50000 UNITS CAPS capsule Take 1 capsule (50,000 Units total) by mouth every 7 (seven) days. Patient taking differently: Take 50,000 Units by mouth every 7 (seven) days. On Sundays 09/23/14  Yes Lorayne Marek, MD    Physical Exam: Filed Vitals:   08/01/15 1635 08/01/15 2215 08/01/15 2230 08/01/15 2245  BP: 151/95 158/94 158/100 159/91  Pulse: 85 74 80 78  Temp: 97.6 F (36.4 C)     TempSrc: Oral     Resp: 18     Weight: 73.936 kg (163 lb)     SpO2: 100% 100% 100% 100%     General:  Moderately built and nourished.  Eyes: Anicteric no pallor.  ENT: No discharge from the ears eyes nose or mouth.  Neck: No mass felt.  Cardiovascular: S1 and S2 heard.  Respiratory: No rhonchi or crepitations.  Abdomen: Soft nontender bowel sounds present.  Skin: Left foot is erythematous with left great toe showing small cell with discoloration.  Musculoskeletal: Bilateral lower extremity edema.  Psychiatric: Appears normal.  Neurologic: Alert awake oriented to time place and person. Moves all extremities.  Labs on Admission:  Basic Metabolic Panel:  Recent Labs Lab 07/28/15 1151 08/01/15 1714  NA 140 143  K 4.5 4.4  CL 104 106  CO2 27 28  GLUCOSE 203* 183*  BUN 21* 17  CREATININE 0.94 0.94  CALCIUM 9.0 9.1   Liver Function Tests: No results for input(s): AST, ALT, ALKPHOS, BILITOT, PROT, ALBUMIN in the last 168 hours. No results for input(s): LIPASE, AMYLASE in the last 168 hours. No results for input(s): AMMONIA in the last 168 hours. CBC:  Recent Labs Lab  07/28/15 1151 08/01/15 1714  WBC 11.4* 9.0  NEUTROABS 8.5*  --   HGB 9.0* 9.0*  HCT 26.5* 27.4*  MCV 93.3 94.2  PLT 271 327   Cardiac Enzymes: No results for input(s): CKTOTAL, CKMB, CKMBINDEX, TROPONINI in the last 168 hours.  BNP (last 3 results)  Recent Labs  11/29/14 2252 08/01/15 2106  BNP 39.3 34.7    ProBNP (last 3 results) No results for input(s): PROBNP in the last 8760 hours.  CBG:  Recent Labs Lab 08/01/15 1639  GLUCAP 170*    Radiological Exams on Admission: Dg Foot Complete Left  08/01/2015  CLINICAL DATA:  45 year old male with abscess of the great toe. EXAM: LEFT FOOT - COMPLETE 3+ VIEW  COMPARISON:  Radiograph dated 07/28/2015 go FINDINGS: Stable appearing linear lucency with distal portion of the proximal phalanx of the fifth digit may represent an age indeterminate fracture. No new fracture identified. There is diffuse soft tissue swelling of the foot. No radiopaque foreign object or soft tissue gas. IMPRESSION: Diffuse soft tissue swelling of the foot. No radiopaque foreign object or soft tissue gas. Electronically Signed   By: Anner Crete M.D.   On: 08/01/2015 20:57     Assessment/Plan Principal Problem:   Foot abscess, left Active Problems:   DM2 (diabetes mellitus, type 2) (HCC)   Essential hypertension   Diabetic infection of left foot (HCC)   Chronic anemia   1. Left foot cellulitis with left great toe abscess - at this time I have ordered MRI of the left foot to check for any deep abscesses and also osteomyelitis. For now I have placed patient on empiric antibiotics vancomycin and Zosyn. Patient probably will need orthopedic consult in a.m. Follow MRI results. 2. Diabetes mellitus type 2 - will continue glipizide. Hold metformin while inpatient. Patient is on sliding scale coverage. 3. Hypertension - continue Norvasc and lisinopril and Coreg. 4. Chronic anemia - has follow-up with gastroenterologist as per the primary care  notes. 5. History of bipolar disorder on Depakote.  Patient is in no distress have also increasing lower extremity edema for which Dopplers have been ordered.   DVT Prophylaxis SCDs in anticipation of procedure.  Code Status: Full code.  Family Communication: Discussed with patient's wife.  Disposition Plan: Admit to inpatient.    Nakeesha Bowler N. [facility] Pager [phone].  If 7PM-7AM, please contact night-coverage [email or web address] Password [facility] 08/01/2015, 11:31 PM

## 2015-08-01 NOTE — ED Notes (Signed)
Patient transported to X-ray 

## 2015-08-02 ENCOUNTER — Inpatient Hospital Stay (HOSPITAL_COMMUNITY): Payer: Managed Care, Other (non HMO)

## 2015-08-02 ENCOUNTER — Encounter (HOSPITAL_COMMUNITY): Payer: Self-pay

## 2015-08-02 LAB — BASIC METABOLIC PANEL
ANION GAP: 8 (ref 5–15)
BUN: 11 mg/dL (ref 6–20)
CALCIUM: 9.1 mg/dL (ref 8.9–10.3)
CO2: 29 mmol/L (ref 22–32)
Chloride: 105 mmol/L (ref 101–111)
Creatinine, Ser: 0.9 mg/dL (ref 0.61–1.24)
GLUCOSE: 213 mg/dL — AB (ref 65–99)
POTASSIUM: 4.5 mmol/L (ref 3.5–5.1)
SODIUM: 142 mmol/L (ref 135–145)

## 2015-08-02 LAB — CBC
HEMATOCRIT: 28.3 % — AB (ref 39.0–52.0)
HEMOGLOBIN: 9.3 g/dL — AB (ref 13.0–17.0)
MCH: 31 pg (ref 26.0–34.0)
MCHC: 32.9 g/dL (ref 30.0–36.0)
MCV: 94.3 fL (ref 78.0–100.0)
Platelets: 333 10*3/uL (ref 150–400)
RBC: 3 MIL/uL — AB (ref 4.22–5.81)
RDW: 12.9 % (ref 11.5–15.5)
WBC: 7.6 10*3/uL (ref 4.0–10.5)

## 2015-08-02 LAB — GLUCOSE, CAPILLARY
GLUCOSE-CAPILLARY: 154 mg/dL — AB (ref 65–99)
GLUCOSE-CAPILLARY: 160 mg/dL — AB (ref 65–99)
GLUCOSE-CAPILLARY: 161 mg/dL — AB (ref 65–99)
GLUCOSE-CAPILLARY: 177 mg/dL — AB (ref 65–99)
GLUCOSE-CAPILLARY: 187 mg/dL — AB (ref 65–99)
GLUCOSE-CAPILLARY: 75 mg/dL (ref 65–99)

## 2015-08-02 LAB — SURGICAL PCR SCREEN
MRSA, PCR: NEGATIVE
Staphylococcus aureus: NEGATIVE

## 2015-08-02 MED ORDER — PANTOPRAZOLE SODIUM 40 MG PO TBEC
40.0000 mg | DELAYED_RELEASE_TABLET | Freq: Every day | ORAL | Status: DC
  Administered 2015-08-02 – 2015-08-04 (×3): 40 mg via ORAL
  Filled 2015-08-02 (×4): qty 1

## 2015-08-02 MED ORDER — ACETAMINOPHEN 325 MG PO TABS
650.0000 mg | ORAL_TABLET | Freq: Four times a day (QID) | ORAL | Status: DC | PRN
  Administered 2015-08-02: 650 mg via ORAL
  Filled 2015-08-02 (×2): qty 2

## 2015-08-02 MED ORDER — GLIPIZIDE 5 MG PO TABS
10.0000 mg | ORAL_TABLET | Freq: Two times a day (BID) | ORAL | Status: DC
  Administered 2015-08-02 – 2015-08-04 (×4): 10 mg via ORAL
  Filled 2015-08-02 (×4): qty 2

## 2015-08-02 MED ORDER — VITAMIN D (ERGOCALCIFEROL) 1.25 MG (50000 UNIT) PO CAPS: 50000.0000 [IU] | ORAL_CAPSULE | ORAL | Status: DC

## 2015-08-02 MED ORDER — PIPERACILLIN-TAZOBACTAM 3.375 G IVPB
3.3750 g | Freq: Three times a day (TID) | INTRAVENOUS | Status: DC
  Administered 2015-08-02 – 2015-08-04 (×7): 3.375 g via INTRAVENOUS
  Filled 2015-08-02 (×10): qty 50

## 2015-08-02 MED ORDER — DIVALPROEX SODIUM 500 MG PO DR TAB
500.0000 mg | DELAYED_RELEASE_TABLET | Freq: Every day | ORAL | Status: DC
  Administered 2015-08-02 – 2015-08-04 (×3): 500 mg via ORAL
  Filled 2015-08-02 (×3): qty 1

## 2015-08-02 MED ORDER — CARVEDILOL 12.5 MG PO TABS
12.5000 mg | ORAL_TABLET | Freq: Two times a day (BID) | ORAL | Status: DC
  Administered 2015-08-02 – 2015-08-04 (×5): 12.5 mg via ORAL
  Filled 2015-08-02 (×5): qty 1

## 2015-08-02 MED ORDER — ONDANSETRON HCL 4 MG/2ML IJ SOLN
4.0000 mg | Freq: Four times a day (QID) | INTRAMUSCULAR | Status: DC | PRN
  Administered 2015-08-03: 4 mg via INTRAVENOUS
  Filled 2015-08-02: qty 2

## 2015-08-02 MED ORDER — ATORVASTATIN CALCIUM 80 MG PO TABS
80.0000 mg | ORAL_TABLET | Freq: Every morning | ORAL | Status: DC
  Administered 2015-08-02 – 2015-08-04 (×3): 80 mg via ORAL
  Filled 2015-08-02 (×3): qty 1

## 2015-08-02 MED ORDER — ONDANSETRON HCL 4 MG PO TABS: 4.0000 mg | ORAL_TABLET | Freq: Four times a day (QID) | ORAL | Status: DC | PRN

## 2015-08-02 MED ORDER — ACETAMINOPHEN 650 MG RE SUPP: 650.0000 mg | Freq: Four times a day (QID) | RECTAL | Status: DC | PRN

## 2015-08-02 MED ORDER — SODIUM CHLORIDE 0.9 % IV SOLN
INTRAVENOUS | Status: DC
  Administered 2015-08-02: 01:00:00 via INTRAVENOUS

## 2015-08-02 MED ORDER — LISINOPRIL 40 MG PO TABS
40.0000 mg | ORAL_TABLET | Freq: Every day | ORAL | Status: DC
  Administered 2015-08-02 – 2015-08-04 (×3): 40 mg via ORAL
  Filled 2015-08-02 (×3): qty 1

## 2015-08-02 MED ORDER — SODIUM CHLORIDE 0.45 % IV SOLN
INTRAVENOUS | Status: DC
  Administered 2015-08-03: 05:00:00 via INTRAVENOUS

## 2015-08-02 MED ORDER — OXYCODONE-ACETAMINOPHEN 5-325 MG PO TABS
1.0000 | ORAL_TABLET | Freq: Four times a day (QID) | ORAL | Status: DC | PRN
  Administered 2015-08-02 – 2015-08-03 (×2): 1 via ORAL
  Filled 2015-08-02 (×2): qty 1

## 2015-08-02 MED ORDER — INSULIN ASPART 100 UNIT/ML ~~LOC~~ SOLN
0.0000 [IU] | Freq: Three times a day (TID) | SUBCUTANEOUS | Status: DC
Start: 2015-08-02 — End: 2015-08-04
  Administered 2015-08-02 – 2015-08-03 (×4): 2 [IU] via SUBCUTANEOUS
  Administered 2015-08-04: 5 [IU] via SUBCUTANEOUS

## 2015-08-02 MED ORDER — VANCOMYCIN HCL IN DEXTROSE 750-5 MG/150ML-% IV SOLN
750.0000 mg | Freq: Three times a day (TID) | INTRAVENOUS | Status: DC
  Administered 2015-08-02 – 2015-08-03 (×4): 750 mg via INTRAVENOUS
  Filled 2015-08-02 (×7): qty 150

## 2015-08-02 MED ORDER — FERROUS SULFATE 325 (65 FE) MG PO TABS
325.0000 mg | ORAL_TABLET | Freq: Two times a day (BID) | ORAL | Status: DC
  Administered 2015-08-02 – 2015-08-04 (×4): 325 mg via ORAL
  Filled 2015-08-02 (×4): qty 1

## 2015-08-02 MED ORDER — AMLODIPINE BESYLATE 5 MG PO TABS
5.0000 mg | ORAL_TABLET | Freq: Every day | ORAL | Status: DC
  Administered 2015-08-02 – 2015-08-04 (×3): 5 mg via ORAL
  Filled 2015-08-02 (×3): qty 1

## 2015-08-02 NOTE — Progress Notes (Signed)
Pharmacy Antibiotic Note  Golan Sego is a 45 y.o. male admitted on 08/01/2015 with cellulitis and diabetic foot wound.  Pharmacy has been consulted for Vancomycin and Zosyn dosing. WBC WNL. Renal function is good. Other labs reviewed.   Plan: -Vancomycin 750 mg IV q8h -Zosyn 3.375G IV q8h to be infused over 4 hours -Trend WBC, temp, renal function  -Drug levels as indicated   Weight: 163 lb (73.936 kg)  Temp (24hrs), Avg:97.6 F (36.4 C), Min:97.6 F (36.4 C), Max:97.6 F (36.4 C)   Recent Labs Lab 07/28/15 1151 08/01/15 1714  WBC 11.4* 9.0  CREATININE 0.94 0.94    Estimated Creatinine Clearance: 95 mL/min (by C-G formula based on Cr of 0.94).    Allergies  Allergen Reactions  . Eggs Or Egg-Derived Products Diarrhea  . Other Other (See Comments)    Red meat causes stomach pains, bloating and diarrhea  . Milk-Related Compounds Diarrhea and Other (See Comments)    Any dairy products  - diarrhea and bloating   Narda Bonds 08/02/2015 12:16 AM

## 2015-08-02 NOTE — Consult Note (Signed)
ORTHOPAEDIC CONSULTATION  REQUESTING PHYSICIAN: Delfina Redwood, MD  Chief Complaint: Ulceration with chronic osteomyelitis left great toe  HPI: Shane [name] is a 45 y.o. male who presents with patient is a 45 year old gentleman with diabetic insensate neuropathy who presents with a chronic history of swelling ulceration of the left great toe  Past Medical History  Diagnosis Date  . Hypertension     a. 08/2014 Admitted with hypertensive urgency.  . Chest pain     a. 2015 Reportedly normal stress test in [place].  Marland Kitchen TIA (transient ischemic attack) 08/2014; 03/2015    a. 08/2014 in setting of hypertensive urgency.  . Anemia   . Refusal of blood transfusions as patient is Jehovah's Witness   . Hyperlipidemia   . Chronic diastolic CHF (congestive heart failure) (HCC)     a.03/2015 Echo: EF 55-60%, Gr 1 DD, mild MR, triv PR.  . Type II diabetes mellitus ([place])   . Sleep apnea   . GERD (gastroesophageal reflux disease)   . Anxiety   . Depression   . Bipolar disorder [facility])    Past Surgical History  Procedure Laterality Date  . Inguinal hernia repair Bilateral ~ 1983- ~ 1986  . Tonsillectomy  ~ 42   Social History   Social History  . Marital Status: Married    Spouse Name: N/A  . Number of Children: N/A  . Years of Education: N/A   Social History Main Topics  . Smoking status: Never Smoker   . Smokeless tobacco: Never Used  . Alcohol Use: No  . Drug Use: No  . Sexual Activity: No   Other Topics Concern  . None   Social History Narrative   Lives in [place] with wife.  Active but doesn't routinely exercise.   Family History  Problem Relation Age of Onset  . Hypertension Mother   . Diabetes Mother   . Hyperlipidemia Mother   . Heart disease Mother     s/p pacemaker  . Diabetes Father   . Hypertension Father   . Stroke Father   . Heart attack Father     first MI @ 54.  . Stroke Brother    - negative except otherwise stated in the family history  section Allergies  Allergen Reactions  . Eggs Or Egg-Derived Products Diarrhea  . Other Other (See Comments)    Red meat causes stomach pains, bloating and diarrhea  . Milk-Related Compounds Diarrhea and Other (See Comments)    Any dairy products  - diarrhea and bloating   Prior to Admission medications   Medication Sig Start Date End Date Taking? Authorizing Provider  amLODipine (NORVASC) 5 MG tablet TAKE 1 TABLET BY MOUTH DAILY 07/18/15  Yes Josalyn Funches, MD  atorvastatin (LIPITOR) 80 MG tablet Take 1 tablet (80 mg total) by mouth daily. Patient taking differently: Take 80 mg by mouth every morning.  04/04/15  Yes Nita Sells, MD  carvedilol (COREG) 12.5 MG tablet TAKE 1 TABLET BY MOUTH 2 TIMES DAILY WITH A MEAL 05/17/15  Yes Josalyn Funches, MD  divalproex (DEPAKOTE) 500 MG DR tablet Take 1 tablet (500 mg total) by mouth at bedtime. 04/04/15  Yes Nita Sells, MD  ferrous sulfate 325 (65 FE) MG tablet Take 1 tablet (325 mg total) by mouth 2 (two) times daily with a meal. 07/04/15  Yes Josalyn Funches, MD  glipiZIDE (GLUCOTROL) 10 MG tablet Take 1 tablet (10 mg total) by mouth 2 (two) times daily before a meal. 07/04/15  Yes  Boykin Nearing, MD  ibuprofen (ADVIL,MOTRIN) 200 MG tablet Take 400 mg by mouth every 6 (six) hours as needed for headache (pain).   Yes Historical Provider, MD  lisinopril (PRINIVIL,ZESTRIL) 40 MG tablet Take 1 tablet (40 mg total) by mouth daily. 07/04/15  Yes Boykin Nearing, MD  metFORMIN (GLUCOPHAGE) 1000 MG tablet Take 1 tablet (1,000 mg total) by mouth 2 (two) times daily with a meal. 07/04/15  Yes Josalyn Funches, MD  omeprazole (PRILOSEC) 20 MG capsule Take 1 capsule (20 mg total) by mouth daily. 04/12/15  Yes Josalyn Funches, MD  oxyCODONE-acetaminophen (PERCOCET/ROXICET) 5-325 MG tablet Take 1 tablet by mouth every 6 (six) hours as needed for severe pain. 07/28/15  Yes Merrily Pew, MD  Vitamin D, Ergocalciferol, (DRISDOL) 50000 UNITS CAPS capsule  Take 1 capsule (50,000 Units total) by mouth every 7 (seven) days. Patient taking differently: Take 50,000 Units by mouth every 7 (seven) days. On Sundays 09/23/14  Yes Lorayne Marek, MD   Dg Ankle Complete Right  08/02/2015  CLINICAL DATA:  Swelling over RIGHT ankle, few days duration, no injury, history hypertension, diabetes mellitus, chronic diastolic CHF EXAM: RIGHT ANKLE - COMPLETE 3+ VIEW COMPARISON:  None FINDINGS: Osseous mineralization normal. Joint spaces preserved. Lateral soft tissue swelling. No acute fracture, dislocation or bone destruction. Atherosclerotic calcifications of the dorsalis pedis and posterior tibial arteries extending into foot. IMPRESSION: No acute osseous abnormalities. Small vessel vascular calcification likely related to history of diabetes mellitus. Electronically Signed   By: Lavonia Dana M.D.   On: 08/02/2015 10:14   Mr Foot Left Wo Contrast  08/02/2015  CLINICAL DATA:  Left foot and great toe swelling for approximately 10 days in a diabetic patient. Wound with discharge on the left great toe. EXAM: MRI OF THE LEFT FOREFOOT WITHOUT CONTRAST TECHNIQUE: Multiplanar, multisequence MR imaging was performed. No intravenous contrast was administered. COMPARISON:  Plain films left foot 08/01/2015. FINDINGS: There is increased T2 signal with corresponding decreased T1 signal throughout the distal phalanx of the great toe consistent with osteomyelitis. There is some first MTP degenerative disease with mild subchondral edema seen in the medial periphery of the first metatarsal head. Bone marrow signal is otherwise unremarkable. Only a trace amount of fluid is seen in the IP joint of the great toe. The patient has a very small first MTP joint effusion. Soft tissues demonstrate diffuse subcutaneous edema which is worst over the dorsum of the foot and about the great toe. No abscess is identified. Intrinsic musculature of the foot appears intact. There is no mass. IMPRESSION: Findings  consistent with cellulitis of the left foot and osteomyelitis throughout the distal phalanx of the great toe. Negative for abscess or septic joint. Mild first MTP osteoarthritis. Electronically Signed   By: Inge Rise M.D.   On: 08/02/2015 11:29   Dg Foot Complete Left  08/01/2015  CLINICAL DATA:  45 year old male with abscess of the great toe. EXAM: LEFT FOOT - COMPLETE 3+ VIEW COMPARISON:  Radiograph dated 07/28/2015 go FINDINGS: Stable appearing linear lucency with distal portion of the proximal phalanx of the fifth digit may represent an age indeterminate fracture. No new fracture identified. There is diffuse soft tissue swelling of the foot. No radiopaque foreign object or soft tissue gas. IMPRESSION: Diffuse soft tissue swelling of the foot. No radiopaque foreign object or soft tissue gas. Electronically Signed   By: Anner Crete M.D.   On: 08/01/2015 20:57   - pertinent xrays, CT, MRI studies were reviewed and independently interpreted  Positive ROS: All other systems have been reviewed and were otherwise negative with the exception of those mentioned in the HPI and as above.  Physical Exam: General: Alert, no acute distress Cardiovascular: No pedal edema Respiratory: No cyanosis, no use of accessory musculature GI: No organomegaly, abdomen is soft and non-tender Skin: No lesions in the area of chief complaint Neurologic: Sensation intact distally Psychiatric: Patient is competent for consent with normal mood and affect Lymphatic: No axillary or cervical lymphadenopathy  MUSCULOSKELETAL:  On examination patient does have redness and swelling of the left great toe he has an ulcer which extends down to bone. Review of the MRI scan shows osteomyelitis of the entire tuft of the left great toe. Review of the radiographs shows that it changes of the entire tuft of the great toe also consistent with chronic osteomyelitis. Examination of his legs he has brawny skin color changes with  venous ulcers involving both lower extremities. Patient does have a palpable dorsalis pedis pulse.  Assessment: Assessment: Diabetic insensate neuropathy with chronic osteomyelitis of the tuft of the left great toe.    Plan: Plan: We will plan for left great toe amputation tomorrow evening. Risk and benefits were discussed including infection neurovascular injury nonhealing the wound need for additional surgery. Patient states he understands wishes to proceed at this time.  Thank you for the consult and the opportunity to see [name], [place] [phone] 6:01 PM

## 2015-08-02 NOTE — Progress Notes (Signed)
[facility] PROGRESS NOTE  Lamarion Spedding [id number] DOB: 1970-09-01 DOA: 08/01/2015 PCP: Minerva Ends, MD  Summary 45 y.o. male with history of diabetes mellitus type 2, hypertension, TIA and bipolar disorder presents to the ER because of worsening swelling of the left great toe and foot. Patient has been having these symptoms over last week and a half. Patient had come to the ER last week and was prescribed doxycycline despite taking which patients swelling worsened with discharge. In the ER here for lesion had slightly last the left great toe and had put on discharge. X-rays shows soft tissue swelling. Patient has been admitted for further management of left foot cellulitis with abscess. Patient has decreased sensation in the lower extremity is. Also has noticed increasing swelling. Denies any fever or chills.   Assessment/Plan:  Principal Problem:  Left great toe infection with foot cellulitis. Area of fluctuance. MRI pending. Will consult Dr. Sharol Given for recs. Continue Vanc, zosyn Active Problems:   DM2 (diabetes mellitus, type 2) ([place]) with neurologic complications (peripheral neuropathy), controlled   Essential hypertension: continue current and adjust prn   Diabetic infection of left foot (HCC)   Chronic anemia Right ankle pain/swelling: check xray  Code Status:  full Family Communication:  Pt is lucid Disposition Plan:  Await MRI, ortho consult  Consultants:  Sharol Given  Procedures:     Antibiotics:  Vanc, zosyn  HPI/Subjective: No pain. C/o RIGHT ankle swelling and pain started a few weeks ago. No trauma to the area  Objective: Filed Vitals:   08/02/15 0018 08/02/15 0300  BP: 166/96 153/87  Pulse: 83 85  Temp:  98 F (36.7 C)  Resp:      Intake/Output Summary (Last 24 hours) at 08/02/15 0825 Last data filed at 08/02/15 [id number]  Gross per 24 hour  Intake  877.5 ml  Output   1900 ml  Net -1022.5 ml   Filed Weights   08/01/15 1635  Weight: 73.936 kg  (163 lb)    Exam:   General:  A and o, nontoxic  Cardiovascular: RRR without MGR  Respiratory: CTA without WRR  Abdomen: S, NT, ND  Ext: left foot indurated, warm, erythematous. Left great toe without nail, callous noted and fluctuance. No drainage or odor. Right ankle with lateral swelling, no erythema or warmth  Basic Metabolic Panel:  Recent Labs Lab 07/28/15 1151 08/01/15 1714  NA 140 143  K 4.5 4.4  CL 104 106  CO2 27 28  GLUCOSE 203* 183*  BUN 21* 17  CREATININE 0.94 0.94  CALCIUM 9.0 9.1   Liver Function Tests: No results for input(s): AST, ALT, ALKPHOS, BILITOT, PROT, ALBUMIN in the last 168 hours. No results for input(s): LIPASE, AMYLASE in the last 168 hours. No results for input(s): AMMONIA in the last 168 hours. CBC:  Recent Labs Lab 07/28/15 1151 08/01/15 1714  WBC 11.4* 9.0  NEUTROABS 8.5*  --   HGB 9.0* 9.0*  HCT 26.5* 27.4*  MCV 93.3 94.2  PLT 271 327   Cardiac Enzymes: No results for input(s): CKTOTAL, CKMB, CKMBINDEX, TROPONINI in the last 168 hours. BNP (last 3 results)  Recent Labs  11/29/14 2252 08/01/15 2106  BNP 39.3 34.7    ProBNP (last 3 results) No results for input(s): PROBNP in the last 8760 hours.  CBG:  Recent Labs Lab 08/01/15 1639 08/02/15 0029 08/02/15 0554 08/02/15 0702  GLUCAP 170* 75 161* 187*    No results found for this or any previous visit (from the past  240 hour(s)).   Studies: Dg Foot Complete Left  08/01/2015  CLINICAL DATA:  45 year old male with abscess of the great toe. EXAM: LEFT FOOT - COMPLETE 3+ VIEW COMPARISON:  Radiograph dated 07/28/2015 go FINDINGS: Stable appearing linear lucency with distal portion of the proximal phalanx of the fifth digit may represent an age indeterminate fracture. No new fracture identified. There is diffuse soft tissue swelling of the foot. No radiopaque foreign object or soft tissue gas. IMPRESSION: Diffuse soft tissue swelling of the foot. No radiopaque foreign  object or soft tissue gas. Electronically Signed   By: Anner Crete M.D.   On: 08/01/2015 20:57    Scheduled Meds: . amLODipine  5 mg Oral Daily  . atorvastatin  80 mg Oral q morning - 10a  . carvedilol  12.5 mg Oral BID WC  . divalproex  500 mg Oral QHS  . ferrous sulfate  325 mg Oral BID WC  . glipiZIDE  10 mg Oral BID AC  . insulin aspart  0-9 Units Subcutaneous TID WC  . lisinopril  40 mg Oral Daily  . pantoprazole  40 mg Oral Daily  . piperacillin-tazobactam (ZOSYN)  IV  3.375 g Intravenous 3 times per day  . vancomycin  750 mg Intravenous Q8H  . [START ON 08/06/2015] Vitamin D (Ergocalciferol)  50,000 Units Oral Q7 days   Continuous Infusions: . sodium chloride 50 mL/hr at 08/02/15 0118    Time spent: 25 minutes  [facility] [email or web address], password [facility] 08/02/2015, 8:25 AM  LOS: 1 day

## 2015-08-02 NOTE — Care Management (Signed)
Utilization review completed. Devyne Hauger, RN Case Manager [phone]. 

## 2015-08-03 ENCOUNTER — Encounter (HOSPITAL_COMMUNITY): Payer: Self-pay | Admitting: General Practice

## 2015-08-03 ENCOUNTER — Inpatient Hospital Stay (HOSPITAL_COMMUNITY): Payer: Managed Care, Other (non HMO)

## 2015-08-03 ENCOUNTER — Encounter (HOSPITAL_COMMUNITY)
Admission: EM | Disposition: A | Discharge status: Home / Self Care | Payer: Self-pay | Source: Home / Self Care | Attending: Internal Medicine

## 2015-08-03 ENCOUNTER — Inpatient Hospital Stay (HOSPITAL_COMMUNITY): Payer: Managed Care, Other (non HMO) | Admitting: Anesthesiology

## 2015-08-03 DIAGNOSIS — R609 Edema, unspecified: Secondary | ICD-10-CM

## 2015-08-03 HISTORY — PX: AMPUTATION: SHX166

## 2015-08-03 LAB — GLUCOSE, CAPILLARY
GLUCOSE-CAPILLARY: 140 mg/dL — AB (ref 65–99)
GLUCOSE-CAPILLARY: 89 mg/dL (ref 65–99)
Glucose-Capillary: 174 mg/dL — ABNORMAL HIGH (ref 65–99)
Glucose-Capillary: 126 mg/dL — ABNORMAL HIGH (ref 65–99)

## 2015-08-03 LAB — VANCOMYCIN, TROUGH: Vancomycin Tr: 13 ug/mL (ref 10.0–20.0)

## 2015-08-03 SURGERY — AMPUTATION, FOOT, RAY
Anesthesia: General | Site: Toe | Laterality: Left

## 2015-08-03 MED ORDER — ACETAMINOPHEN 650 MG RE SUPP: 650.0000 mg | Freq: Four times a day (QID) | RECTAL | Status: DC | PRN

## 2015-08-03 MED ORDER — LIDOCAINE HCL (CARDIAC) 20 MG/ML IV SOLN
INTRAVENOUS | Status: DC | PRN
  Administered 2015-08-03: 100 mg via INTRAVENOUS

## 2015-08-03 MED ORDER — ONDANSETRON HCL 4 MG/2ML IJ SOLN: 4.0000 mg | Freq: Once | INTRAMUSCULAR | Status: DC | PRN

## 2015-08-03 MED ORDER — PROPOFOL 10 MG/ML IV BOLUS
INTRAVENOUS | Status: DC | PRN
Start: 2015-08-03 — End: 2015-08-03
  Administered 2015-08-03: 50 mg via INTRAVENOUS
  Administered 2015-08-03: 150 mg via INTRAVENOUS

## 2015-08-03 MED ORDER — VANCOMYCIN HCL IN DEXTROSE 1-5 GM/200ML-% IV SOLN
1000.0000 mg | Freq: Three times a day (TID) | INTRAVENOUS | Status: DC
  Administered 2015-08-03 – 2015-08-04 (×3): 1000 mg via INTRAVENOUS
  Filled 2015-08-03 (×7): qty 200

## 2015-08-03 MED ORDER — ONDANSETRON HCL 4 MG PO TABS: 4.0000 mg | ORAL_TABLET | Freq: Four times a day (QID) | ORAL | Status: DC | PRN

## 2015-08-03 MED ORDER — METOCLOPRAMIDE HCL 5 MG PO TABS: 5.0000 mg | ORAL_TABLET | Freq: Three times a day (TID) | ORAL | Status: DC | PRN

## 2015-08-03 MED ORDER — PROPOFOL 10 MG/ML IV BOLUS
INTRAVENOUS | Status: AC
  Filled 2015-08-03: qty 20

## 2015-08-03 MED ORDER — MIDAZOLAM HCL 2 MG/2ML IJ SOLN
INTRAMUSCULAR | Status: AC
  Filled 2015-08-03: qty 2

## 2015-08-03 MED ORDER — FENTANYL CITRATE (PF) 100 MCG/2ML IJ SOLN
INTRAMUSCULAR | Status: DC | PRN
  Administered 2015-08-03: 50 ug via INTRAVENOUS

## 2015-08-03 MED ORDER — FENTANYL CITRATE (PF) 250 MCG/5ML IJ SOLN
INTRAMUSCULAR | Status: AC
  Filled 2015-08-03: qty 5

## 2015-08-03 MED ORDER — METHOCARBAMOL 1000 MG/10ML IJ SOLN
500.0000 mg | Freq: Four times a day (QID) | INTRAVENOUS | Status: DC | PRN
  Filled 2015-08-03: qty 5

## 2015-08-03 MED ORDER — ACETAMINOPHEN 325 MG PO TABS: 650.0000 mg | ORAL_TABLET | Freq: Four times a day (QID) | ORAL | Status: DC | PRN

## 2015-08-03 MED ORDER — ONDANSETRON HCL 4 MG/2ML IJ SOLN
INTRAMUSCULAR | Status: AC
  Filled 2015-08-03: qty 2

## 2015-08-03 MED ORDER — ONDANSETRON HCL 4 MG/2ML IJ SOLN
INTRAMUSCULAR | Status: DC | PRN
  Administered 2015-08-03: 4 mg via INTRAVENOUS

## 2015-08-03 MED ORDER — LIDOCAINE HCL (CARDIAC) 20 MG/ML IV SOLN
INTRAVENOUS | Status: AC
  Filled 2015-08-03: qty 5

## 2015-08-03 MED ORDER — METOCLOPRAMIDE HCL 5 MG/ML IJ SOLN: 5.0000 mg | Freq: Three times a day (TID) | INTRAMUSCULAR | Status: DC | PRN

## 2015-08-03 MED ORDER — SODIUM CHLORIDE 0.9 % IV SOLN: INTRAVENOUS | Status: DC

## 2015-08-03 MED ORDER — HYDROMORPHONE HCL 1 MG/ML IJ SOLN: 1.0000 mg | INTRAMUSCULAR | Status: DC | PRN

## 2015-08-03 MED ORDER — METHOCARBAMOL 500 MG PO TABS
500.0000 mg | ORAL_TABLET | Freq: Four times a day (QID) | ORAL | Status: DC | PRN
  Administered 2015-08-04 (×2): 500 mg via ORAL
  Filled 2015-08-03 (×2): qty 1

## 2015-08-03 MED ORDER — SODIUM CHLORIDE 0.9 % IV SOLN
INTRAVENOUS | Status: DC | PRN
  Administered 2015-08-03: 20:00:00 via INTRAVENOUS

## 2015-08-03 MED ORDER — ONDANSETRON HCL 4 MG/2ML IJ SOLN: 4.0000 mg | Freq: Four times a day (QID) | INTRAMUSCULAR | Status: DC | PRN

## 2015-08-03 MED ORDER — MIDAZOLAM HCL 5 MG/5ML IJ SOLN
INTRAMUSCULAR | Status: DC | PRN
  Administered 2015-08-03: 2 mg via INTRAVENOUS

## 2015-08-03 MED ORDER — 0.9 % SODIUM CHLORIDE (POUR BTL) OPTIME
TOPICAL | Status: DC | PRN
  Administered 2015-08-03: 1000 mL

## 2015-08-03 MED ORDER — HYDROMORPHONE HCL 1 MG/ML IJ SOLN: 0.2500 mg | INTRAMUSCULAR | Status: DC | PRN

## 2015-08-03 MED ORDER — OXYCODONE HCL 5 MG PO TABS
5.0000 mg | ORAL_TABLET | ORAL | Status: DC | PRN
  Administered 2015-08-04 (×2): 5 mg via ORAL
  Filled 2015-08-03 (×2): qty 1

## 2015-08-03 MED ORDER — MEPERIDINE HCL 25 MG/ML IJ SOLN: 6.2500 mg | INTRAMUSCULAR | Status: DC | PRN

## 2015-08-03 MED ORDER — SUCCINYLCHOLINE CHLORIDE 20 MG/ML IJ SOLN
INTRAMUSCULAR | Status: AC
  Filled 2015-08-03: qty 1

## 2015-08-03 SURGICAL SUPPLY — 33 items
BLADE SAW SGTL MED 73X18.5 STR (BLADE) IMPLANT
BNDG COHESIVE 4X5 TAN STRL (GAUZE/BANDAGES/DRESSINGS) ×3 IMPLANT
BNDG GAUZE ELAST 4 BULKY (GAUZE/BANDAGES/DRESSINGS) ×3 IMPLANT
COVER SURGICAL LIGHT HANDLE (MISCELLANEOUS) ×6 IMPLANT
DRAPE U-SHAPE 47X51 STRL (DRAPES) ×6 IMPLANT
DRSG ADAPTIC 3X8 NADH LF (GAUZE/BANDAGES/DRESSINGS) ×3 IMPLANT
DRSG PAD ABDOMINAL 8X10 ST (GAUZE/BANDAGES/DRESSINGS) ×3 IMPLANT
DURAPREP 26ML APPLICATOR (WOUND CARE) ×3 IMPLANT
ELECT REM PT RETURN 9FT ADLT (ELECTROSURGICAL) ×3
ELECTRODE REM PT RTRN 9FT ADLT (ELECTROSURGICAL) ×1 IMPLANT
GAUZE SPONGE 4X4 12PLY STRL (GAUZE/BANDAGES/DRESSINGS) ×3 IMPLANT
GLOVE BIOGEL PI IND STRL 6.5 (GLOVE) ×1 IMPLANT
GLOVE BIOGEL PI IND STRL 9 (GLOVE) ×1 IMPLANT
GLOVE BIOGEL PI INDICATOR 6.5 (GLOVE) ×2
GLOVE BIOGEL PI INDICATOR 9 (GLOVE) ×2
GLOVE SKINSENSE N 8.5 STRL (GLOVE) ×3 IMPLANT
GLOVE SURG ORTHO 9.0 STRL STRW (GLOVE) ×3 IMPLANT
GOWN STRL REUS W/ TWL LRG LVL3 (GOWN DISPOSABLE) ×1 IMPLANT
GOWN STRL REUS W/ TWL XL LVL3 (GOWN DISPOSABLE) ×2 IMPLANT
GOWN STRL REUS W/TWL LRG LVL3 (GOWN DISPOSABLE) ×2
GOWN STRL REUS W/TWL XL LVL3 (GOWN DISPOSABLE) ×4
KIT BASIN OR (CUSTOM PROCEDURE TRAY) ×3 IMPLANT
KIT ROOM TURNOVER OR (KITS) ×3 IMPLANT
NS IRRIG 1000ML POUR BTL (IV SOLUTION) ×3 IMPLANT
PACK ORTHO EXTREMITY (CUSTOM PROCEDURE TRAY) ×3 IMPLANT
PAD ARMBOARD 7.5X6 YLW CONV (MISCELLANEOUS) ×6 IMPLANT
SPONGE LAP 18X18 X RAY DECT (DISPOSABLE) ×3 IMPLANT
STOCKINETTE IMPERVIOUS LG (DRAPES) IMPLANT
SUT ETHILON 2 0 PSLX (SUTURE) ×6 IMPLANT
TOWEL OR 17X24 6PK STRL BLUE (TOWEL DISPOSABLE) ×3 IMPLANT
TOWEL OR 17X26 10 PK STRL BLUE (TOWEL DISPOSABLE) ×3 IMPLANT
UNDERPAD 30X30 INCONTINENT (UNDERPADS AND DIAPERS) ×3 IMPLANT
WATER STERILE IRR 1000ML POUR (IV SOLUTION) ×3 IMPLANT

## 2015-08-03 NOTE — Anesthesia Procedure Notes (Signed)
Procedure Name: LMA Insertion Date/Time: 08/03/2015 7:38 PM Performed by: Manus Gunning, Deannah Rossi J Pre-anesthesia Checklist: Patient identified, Timeout performed, Emergency Drugs available, Suction available and Patient being monitored Patient Re-evaluated:Patient Re-evaluated prior to inductionOxygen Delivery Method: Circle system utilized Preoxygenation: Pre-oxygenation with 100% oxygen Intubation Type: IV induction Ventilation: Mask ventilation without difficulty LMA: LMA inserted LMA Size: 5.0 Number of attempts: 1 Placement Confirmation: breath sounds checked- equal and bilateral and positive ETCO2 Tube secured with: Tape Dental Injury: Teeth and Oropharynx as per pre-operative assessment

## 2015-08-03 NOTE — Progress Notes (Signed)
Pharmacy Antibiotic Note  Shane [name] is a 45 y.o. male admitted on 08/01/2015 with cellulitis and diabetic foot wound.  Pharmacy has been consulted for vancomycin and Zosyn dosing, today is day #2. MRI revealed osteo in left great toe and he is going to OR for amputation today. Vancomycin trough is 13 and subtherapeutic for osteo.    Plan: Increase Vancomycin 1000 mg IV every 8 hours.  Goal trough 15-20 mcg/mL. Zosyn 3.375g IV q8h (4 hour infusion).  Check VT at steady-state if continued Monitor renal function  Weight: 163 lb (73.936 kg)  Temp (24hrs), Avg:98.1 F (36.7 C), Min:97.8 F (36.6 C), Max:98.3 F (36.8 C)   Recent Labs Lab 07/28/15 1151 08/01/15 1714 08/02/15 1346 08/03/15 1318  WBC 11.4* 9.0 7.6  --   CREATININE 0.94 0.94 0.90  --   VANCOTROUGH  --   --   --  13    Estimated Creatinine Clearance: 99.2 mL/min (by C-G formula based on Cr of 0.9).    Allergies  Allergen Reactions  . Eggs Or Egg-Derived Products Diarrhea  . Other Other (See Comments)    Red meat causes stomach pains, bloating and diarrhea  . Milk-Related Compounds Diarrhea and Other (See Comments)    Any dairy products  - diarrhea and bloating    Antimicrobials this admission: Vanc 2/15 >>  Zosyn 2/15 >>  Dose adjustments this admission:   Microbiology results: 2/15 MRSA PCR: neg  Thank you for allowing pharmacy to be a part of this patient's care.  [place], Pharm.D., BCPS Clinical Pharmacist Pager: [phone] 08/03/2015 2:19 PM

## 2015-08-03 NOTE — Anesthesia Preprocedure Evaluation (Signed)
Anesthesia Evaluation  Patient identified by MRN, date of birth, ID band Patient awake    Reviewed: Allergy & Precautions, NPO status , Patient's Chart, lab work & pertinent test results  Airway Mallampati: I  TM Distance: >3 FB Neck ROM: Full    Dental   Pulmonary    Pulmonary exam normal        Cardiovascular hypertension, Pt. on medications Normal cardiovascular exam     Neuro/Psych Anxiety Depression Bipolar Disorder TIA   GI/Hepatic GERD  Medicated and Controlled,  Endo/Other  diabetes, Type 2, Oral Hypoglycemic Agents  Renal/GU      Musculoskeletal   Abdominal   Peds  Hematology   Anesthesia Other Findings   Reproductive/Obstetrics                             Anesthesia Physical Anesthesia Plan  ASA: II  Anesthesia Plan: General   Post-op Pain Management:    Induction: Intravenous  Airway Management Planned: LMA  Additional Equipment:   Intra-op Plan:   Post-operative Plan: Extubation in OR  Informed Consent: I have reviewed the patients History and Physical, chart, labs and discussed the procedure including the risks, benefits and alternatives for the proposed anesthesia with the patient or authorized representative who has indicated his/her understanding and acceptance.     Plan Discussed with: CRNA and Surgeon  Anesthesia Plan Comments:         Anesthesia Quick Evaluation

## 2015-08-03 NOTE — Anesthesia Postprocedure Evaluation (Signed)
Anesthesia Post Note  Patient: Shane [name]  Procedure(s) Performed: Procedure(s) (LRB): AMPUTATION LEFT GREAT TOE (Left)  Patient location during evaluation: PACU Anesthesia Type: General Level of consciousness: awake and alert Pain management: pain level controlled Vital Signs Assessment: post-procedure vital signs reviewed and stable Respiratory status: spontaneous breathing, nonlabored ventilation, respiratory function stable and patient connected to nasal cannula oxygen Cardiovascular status: blood pressure returned to baseline and stable Postop Assessment: no signs of nausea or vomiting Anesthetic complications: no    Last Vitals:  Filed Vitals:   08/03/15 2100 08/03/15 2113  BP: 141/74 129/90  Pulse: 70 69  Temp:  36.8 C  Resp: 14 12    Last Pain:  Filed Vitals:   08/03/15 2121  PainSc: 0-No pain                 Cyrus Ramsburg DAVID

## 2015-08-03 NOTE — Op Note (Signed)
08/01/2015 - 08/03/2015  7:47 PM  PATIENT:  Antony Madura    PRE-OPERATIVE DIAGNOSIS:  OSTEOMYLITIS LEFT GREAT TOE  POST-OPERATIVE DIAGNOSIS:  Same  PROCEDURE:  AMPUTATION LEFT GREAT TOE  SURGEON:  Newt Minion, MD  PHYSICIAN ASSISTANT:None ANESTHESIA:   General  PREOPERATIVE INDICATIONS:  Braeden Addicks is a  45 y.o. male with a diagnosis of OSTEOMYLITIS LEFT GREAT TOE who failed conservative measures and elected for surgical management.    The risks benefits and alternatives were discussed with the patient preoperatively including but not limited to the risks of infection, bleeding, nerve injury, cardiopulmonary complications, the need for revision surgery, among others, and the patient was willing to proceed.  OPERATIVE IMPLANTS: None  OPERATIVE FINDINGS: Minimal petechial bleeding  OPERATIVE PROCEDURE: Patient was brought to the operating room and underwent a general anesthetic. After adequate levels anesthesia obtained patient's left lower extremity was prepped using DuraPrep draped into a sterile field. A timeout was called. Elliptical incision was made just distal to the MTP joint. The left great toe was amputated through the MTP joint. Electrocautery was used for hemostasis. The wound was irrigated with normal saline. The incision was closed using 2-0 nylon. A sterile compressive dressing was applied patient was extubated taken to the PACU in stable condition.

## 2015-08-03 NOTE — Progress Notes (Signed)
VASCULAR LAB PRELIMINARY  PRELIMINARY  PRELIMINARY  PRELIMINARY  Bilateral lower extremity venous has been completed.  Bilateral:  No evidence of DVT, superficial thrombosis, or Baker's Cyst.  Janifer Adie, RVT, RDMS 08/03/2015, 11:52 AM

## 2015-08-03 NOTE — Progress Notes (Signed)
Orthopedic Tech Progress Note Patient Details:  Shane [name] 11-27-1970 [id number] Left post op shoe with pt.'s nurse for use in AM. Ortho Devices Type of Ortho Device: Postop shoe/boot Ortho Device/Splint Location: LLE   Darrol Poke 08/03/2015, 8:57 PM

## 2015-08-03 NOTE — Progress Notes (Signed)
[facility] PROGRESS NOTE  Shane [name] [id number] DOB: Mar 10, 1971 DOA: 08/01/2015 PCP: Minerva Ends, MD  Summary 45 y.o. male with history of diabetes mellitus type 2, hypertension, TIA and bipolar disorder presents to the ER because of worsening swelling of the left great toe and foot. Patient has been having these symptoms over last week and a half. Patient had come to the ER last week and was prescribed doxycycline despite taking which patients swelling worsened with discharge. In the ER here for lesion had slightly last the left great toe and had put on discharge. X-rays shows soft tissue swelling. Patient has been admitted for further management of left foot cellulitis with abscess. Patient has decreased sensation in the lower extremity is. Also has noticed increasing swelling. Denies any fever or chills.   Assessment/Plan:  Principal Problem:  Left great osteomyelitis with foot cellulitis. For great toe amputation today. Continue Vanc, zosyn. Doppler negative for DVT Active Problems:   DM2 (diabetes mellitus, type 2) (HCC) with neurologic complications (peripheral neuropathy), controlled   Essential hypertension: continue current and adjust prn   Chronic anemia Right ankle pain/swelling: xray without bony abnormality  Code Status:  full Family Communication:  wife Disposition Plan:  Await MRI, ortho consult  Consultants:  Sharol Given  Procedures:     Antibiotics:  Vanc, zosyn  HPI/Subjective: No new complaints  Objective: Filed Vitals:   08/03/15 0522 08/03/15 0903  BP: 122/87 133/80  Pulse: 71 75  Temp: 98.3 F (36.8 C)   Resp: 18     Intake/Output Summary (Last 24 hours) at 08/03/15 1123 Last data filed at 08/03/15 0522  Gross per 24 hour  Intake    720 ml  Output   1400 ml  Net   -680 ml   Filed Weights   08/01/15 1635  Weight: 73.936 kg (163 lb)    Exam:   General:  A and o, nontoxic  Cardiovascular: RRR without MGR  Respiratory:  CTA without WRR  Abdomen: S, NT, ND  Ext: left foot indurated, warm, erythematous. Left great toe without nail, callous noted and fluctuance. No drainage or odor. Right ankle with lateral swelling, no erythema or warmth  Basic Metabolic Panel:  Recent Labs Lab 07/28/15 1151 08/01/15 1714 08/02/15 1346  NA 140 143 142  K 4.5 4.4 4.5  CL 104 106 105  CO2 27 28 29   GLUCOSE 203* 183* 213*  BUN 21* 17 11  CREATININE 0.94 0.94 0.90  CALCIUM 9.0 9.1 9.1   Liver Function Tests: No results for input(s): AST, ALT, ALKPHOS, BILITOT, PROT, ALBUMIN in the last 168 hours. No results for input(s): LIPASE, AMYLASE in the last 168 hours. No results for input(s): AMMONIA in the last 168 hours. CBC:  Recent Labs Lab 07/28/15 1151 08/01/15 1714 08/02/15 1346  WBC 11.4* 9.0 7.6  NEUTROABS 8.5*  --   --   HGB 9.0* 9.0* 9.3*  HCT 26.5* 27.4* 28.3*  MCV 93.3 94.2 94.3  PLT 271 327 333   Cardiac Enzymes: No results for input(s): CKTOTAL, CKMB, CKMBINDEX, TROPONINI in the last 168 hours. BNP (last 3 results)  Recent Labs  11/29/14 2252 08/01/15 2106  BNP 39.3 34.7    ProBNP (last 3 results) No results for input(s): PROBNP in the last 8760 hours.  CBG:  Recent Labs Lab 08/02/15 0702 08/02/15 1242 08/02/15 1655 08/02/15 2240 08/03/15 0705  GLUCAP 187* 160* 154* 177* 174*    Recent Results (from the past 240 hour(s))  Surgical pcr screen  Status: None   Collection Time: 08/02/15  9:22 PM  Result Value Ref Range Status   MRSA, PCR NEGATIVE NEGATIVE Final   Staphylococcus aureus NEGATIVE NEGATIVE Final    Comment:        The Xpert SA Assay (FDA approved for NASAL specimens in patients over 16 years of age), is one component of a comprehensive surveillance program.  Test performance has been validated by [facility] for patients greater than or equal to 81 year old. It is not intended to diagnose infection nor to guide or monitor treatment.      Studies: Dg  Ankle Complete Right  08/02/2015  CLINICAL DATA:  Swelling over RIGHT ankle, few days duration, no injury, history hypertension, diabetes mellitus, chronic diastolic CHF EXAM: RIGHT ANKLE - COMPLETE 3+ VIEW COMPARISON:  None FINDINGS: Osseous mineralization normal. Joint spaces preserved. Lateral soft tissue swelling. No acute fracture, dislocation or bone destruction. Atherosclerotic calcifications of the dorsalis pedis and posterior tibial arteries extending into foot. IMPRESSION: No acute osseous abnormalities. Small vessel vascular calcification likely related to history of diabetes mellitus. Electronically Signed   By: Lavonia Dana M.D.   On: 08/02/2015 10:14   Mr Foot Left Wo Contrast  08/02/2015  CLINICAL DATA:  Left foot and great toe swelling for approximately 10 days in a diabetic patient. Wound with discharge on the left great toe. EXAM: MRI OF THE LEFT FOREFOOT WITHOUT CONTRAST TECHNIQUE: Multiplanar, multisequence MR imaging was performed. No intravenous contrast was administered. COMPARISON:  Plain films left foot 08/01/2015. FINDINGS: There is increased T2 signal with corresponding decreased T1 signal throughout the distal phalanx of the great toe consistent with osteomyelitis. There is some first MTP degenerative disease with mild subchondral edema seen in the medial periphery of the first metatarsal head. Bone marrow signal is otherwise unremarkable. Only a trace amount of fluid is seen in the IP joint of the great toe. The patient has a very small first MTP joint effusion. Soft tissues demonstrate diffuse subcutaneous edema which is worst over the dorsum of the foot and about the great toe. No abscess is identified. Intrinsic musculature of the foot appears intact. There is no mass. IMPRESSION: Findings consistent with cellulitis of the left foot and osteomyelitis throughout the distal phalanx of the great toe. Negative for abscess or septic joint. Mild first MTP osteoarthritis. Electronically  Signed   By: Inge Rise M.D.   On: 08/02/2015 11:29   Dg Foot Complete Left  08/01/2015  CLINICAL DATA:  45 year old male with abscess of the great toe. EXAM: LEFT FOOT - COMPLETE 3+ VIEW COMPARISON:  Radiograph dated 07/28/2015 go FINDINGS: Stable appearing linear lucency with distal portion of the proximal phalanx of the fifth digit may represent an age indeterminate fracture. No new fracture identified. There is diffuse soft tissue swelling of the foot. No radiopaque foreign object or soft tissue gas. IMPRESSION: Diffuse soft tissue swelling of the foot. No radiopaque foreign object or soft tissue gas. Electronically Signed   By: Anner Crete M.D.   On: 08/01/2015 20:57    Scheduled Meds: . amLODipine  5 mg Oral Daily  . atorvastatin  80 mg Oral q morning - 10a  . carvedilol  12.5 mg Oral BID WC  . divalproex  500 mg Oral QHS  . ferrous sulfate  325 mg Oral BID WC  . glipiZIDE  10 mg Oral BID AC  . insulin aspart  0-9 Units Subcutaneous TID WC  . lisinopril  40 mg Oral Daily  .  pantoprazole  40 mg Oral Daily  . piperacillin-tazobactam (ZOSYN)  IV  3.375 g Intravenous 3 times per day  . vancomycin  750 mg Intravenous Q8H  . [START ON 08/06/2015] Vitamin D (Ergocalciferol)  50,000 Units Oral Q7 days   Continuous Infusions: . sodium chloride 75 mL/hr at 08/03/15 0511    Time spent: 15 minutes  [facility] [email or web address], password [facility] 08/03/2015, 11:23 AM  LOS: 2 days

## 2015-08-03 NOTE — Transfer of Care (Signed)
Immediate Anesthesia Transfer of Care Note  Patient: Shane [name]  Procedure(s) Performed: Procedure(s): AMPUTATION LEFT GREAT TOE (Left)  Patient Location: PACU  Anesthesia Type:General  Level of Consciousness: awake  Airway & Oxygen Therapy: Patient Spontanous Breathing  Post-op Assessment: Report given to RN and Post -op Vital signs reviewed and stable  Post vital signs: Reviewed and stable  Last Vitals:  Filed Vitals:   08/03/15 1328 08/03/15 1810  BP: 136/88 148/85  Pulse: 80 74  Temp: 36.7 C 36.6 C  Resp: 16 16    Complications: No apparent anesthesia complications

## 2015-08-03 NOTE — Progress Notes (Signed)
Patient ID: Shane [name], male   DOB: 1970/10/31, 45 y.o.   MRN: [id number] Plan for amputation left great toe at the MTP joint this evening after 5 PM. Patient is nothing by mouth today. Discussed with the patient risks and benefits of surgery the surgical procedure postoperative care patient states that all his questions are answered plan for surgery.

## 2015-08-04 DIAGNOSIS — I10 Essential (primary) hypertension: Secondary | ICD-10-CM

## 2015-08-04 DIAGNOSIS — L089 Local infection of the skin and subcutaneous tissue, unspecified: Secondary | ICD-10-CM

## 2015-08-04 DIAGNOSIS — E119 Type 2 diabetes mellitus without complications: Secondary | ICD-10-CM | POA: Insufficient documentation

## 2015-08-04 DIAGNOSIS — E1169 Type 2 diabetes mellitus with other specified complication: Secondary | ICD-10-CM

## 2015-08-04 DIAGNOSIS — D649 Anemia, unspecified: Secondary | ICD-10-CM

## 2015-08-04 DIAGNOSIS — M869 Osteomyelitis, unspecified: Secondary | ICD-10-CM

## 2015-08-04 DIAGNOSIS — L03116 Cellulitis of left lower limb: Secondary | ICD-10-CM | POA: Diagnosis present

## 2015-08-04 LAB — GLUCOSE, CAPILLARY
GLUCOSE-CAPILLARY: 288 mg/dL — AB (ref 65–99)
Glucose-Capillary: 167 mg/dL — ABNORMAL HIGH (ref 65–99)

## 2015-08-04 LAB — BASIC METABOLIC PANEL
Anion gap: 10 (ref 5–15)
BUN: 11 mg/dL (ref 6–20)
CO2: 28 mmol/L (ref 22–32)
Calcium: 8.9 mg/dL (ref 8.9–10.3)
Chloride: 105 mmol/L (ref 101–111)
Creatinine, Ser: 1.02 mg/dL (ref 0.61–1.24)
GFR calc Af Amer: >60 mL/min (ref >60)
GLUCOSE: 199 mg/dL — AB (ref 65–99)
POTASSIUM: 4.3 mmol/L (ref 3.5–5.1)
Sodium: 143 mmol/L (ref 135–145)

## 2015-08-04 LAB — CBC
HEMATOCRIT: 30.1 % — AB (ref 39.0–52.0)
HEMOGLOBIN: 9.6 g/dL — AB (ref 13.0–17.0)
MCH: 29.6 pg (ref 26.0–34.0)
MCHC: 31.9 g/dL (ref 30.0–36.0)
MCV: 92.9 fL (ref 78.0–100.0)
Platelets: 359 10*3/uL (ref 150–400)
RBC: 3.24 MIL/uL — ABNORMAL LOW (ref 4.22–5.81)
RDW: 12.9 % (ref 11.5–15.5)
WBC: 9.2 10*3/uL (ref 4.0–10.5)

## 2015-08-04 MED ORDER — ATORVASTATIN CALCIUM 80 MG PO TABS: 80.0000 mg | ORAL_TABLET | Freq: Every morning | ORAL | Status: DC

## 2015-08-04 MED ORDER — SULFAMETHOXAZOLE-TRIMETHOPRIM 800-160 MG PO TABS
1.0000 | ORAL_TABLET | Freq: Two times a day (BID) | ORAL | Status: DC
  Administered 2015-08-04: 1 via ORAL
  Filled 2015-08-04: qty 1

## 2015-08-04 MED ORDER — OXYCODONE-ACETAMINOPHEN 5-325 MG PO TABS: 1.0000 | ORAL_TABLET | Freq: Four times a day (QID) | ORAL | Status: DC | PRN

## 2015-08-04 MED ORDER — SULFAMETHOXAZOLE-TRIMETHOPRIM 800-160 MG PO TABS: 1.0000 | ORAL_TABLET | Freq: Two times a day (BID) | ORAL | Status: AC

## 2015-08-04 MED ORDER — INSULIN ASPART 100 UNIT/ML ~~LOC~~ SOLN
0.0000 [IU] | Freq: Three times a day (TID) | SUBCUTANEOUS | Status: DC
  Administered 2015-08-04: 3 [IU] via SUBCUTANEOUS

## 2015-08-04 NOTE — Progress Notes (Signed)
Antony Madura to be D/C'd Home per MD order. Discussed with the patient and all questions fully answered.    Medication List    TAKE these medications        amLODipine 5 MG tablet  Commonly known as:  NORVASC  TAKE 1 TABLET BY MOUTH DAILY     atorvastatin 80 MG tablet  Commonly known as:  LIPITOR  Take 1 tablet (80 mg total) by mouth every morning.     carvedilol 12.5 MG tablet  Commonly known as:  COREG  TAKE 1 TABLET BY MOUTH 2 TIMES DAILY WITH A MEAL     divalproex 500 MG DR tablet  Commonly known as:  DEPAKOTE  Take 1 tablet (500 mg total) by mouth at bedtime.     ferrous sulfate 325 (65 FE) MG tablet  Take 1 tablet (325 mg total) by mouth 2 (two) times daily with a meal.     glipiZIDE 10 MG tablet  Commonly known as:  GLUCOTROL  Take 1 tablet (10 mg total) by mouth 2 (two) times daily before a meal.     ibuprofen 200 MG tablet  Commonly known as:  ADVIL,MOTRIN  Take 400 mg by mouth every 6 (six) hours as needed for headache (pain).     lisinopril 40 MG tablet  Commonly known as:  PRINIVIL,ZESTRIL  Take 1 tablet (40 mg total) by mouth daily.     metFORMIN 1000 MG tablet  Commonly known as:  GLUCOPHAGE  Take 1 tablet (1,000 mg total) by mouth 2 (two) times daily with a meal.     omeprazole 20 MG capsule  Commonly known as:  PRILOSEC  Take 1 capsule (20 mg total) by mouth daily.     oxyCODONE-acetaminophen 5-325 MG tablet  Commonly known as:  PERCOCET/ROXICET  Take 1-2 tablets by mouth every 6 (six) hours as needed for severe pain.     sulfamethoxazole-trimethoprim 800-160 MG tablet  Commonly known as:  BACTRIM DS,SEPTRA DS  Take 1 tablet by mouth every 12 (twelve) hours.     Vitamin D (Ergocalciferol) 50000 units Caps capsule  Commonly known as:  DRISDOL  Take 1 capsule (50,000 Units total) by mouth every 7 (seven) days.        VVS, Skin clean, dry and intact without evidence of skin break down, no evidence of skin tears noted.  IV catheter discontinued  intact. Site without signs and symptoms of complications. Dressing and pressure applied.  An After Visit Summary was printed and given to the patient.  Patient escorted via [place], and D/C home via private auto.  [place] Numbers  08/04/2015 2:29 PM

## 2015-08-04 NOTE — Care Management Note (Signed)
Case Management Note  Patient Details  Name: Korvin Woodcock MRN: [id number] Date of Birth: 1970-08-12  Subjective/Objective:    45 yr old male s/p amputation of  Left great toe secondary to osteomyilitis.              Action/Plan: patient has no home health needs, Case manager will order rolling walker.    Expected Discharge Date:    08/04/15              Expected Discharge Plan:  Home/Self Care  In-House Referral:  NA  Discharge planning Services  CM Consult  Post Acute Care Choice:  Durable Medical Equipment Choice offered to:     DME Arranged:  Walker rolling DME Agency:  [place]:  NA [place] Agency:  NA  Status of Service:  Completed, signed off  Medicare Important Message Given:    Date Medicare IM Given:    Medicare IM give by:    Date Additional Medicare IM Given:    Additional Medicare Important Message give by:     If discussed at [place] of Stay Meetings, dates discussed:    Additional Comments:  Ninfa Meeker, RN 08/04/2015, 9:57 AM

## 2015-08-04 NOTE — Discharge Summary (Signed)
Physician Discharge Summary  Shane [name] [id number] DOB: [birth year] DOA: 08/01/2015  PCP: Minerva Ends, MD  Admit date: 08/01/2015 Discharge date: 08/04/2015  Time spent: 65 minutes  Recommendations for Outpatient Follow-up:  1. Follow-up with Dr. Sharol Given orthopedics 1 week. 2. Follow-up with Minerva Ends, MD in 1-2 weeks. On follow-up patient needs a basic metabolic profile done to follow-up on electrolytes and renal function. Patient's diabetes will need to be reassessed.   Discharge Diagnoses:  Principal Problem:   Toe osteomyelitis, left (HCC) Active Problems:   Cellulitis of left foot   DM2 (diabetes mellitus, type 2) (HCC)   HLD (hyperlipidemia)   Essential hypertension   Diabetic infection of left foot (HCC)   Chronic anemia   Discharge Condition: Stable and improved.  Diet recommendation: Carb modified diet.  Filed Weights   08/01/15 1635  Weight: 73.936 kg (163 lb)    History of present illness:  Per Dr Sandie Ano is a 45 y.o. male with history of diabetes mellitus type 2, hypertension, TIA and bipolar disorder presented to the ER because of worsening swelling of the left great toe and foot. Patient had been having these symptoms over last week and a half. Patient had come to the ER last week and was prescribed doxycycline despite taking which patients swelling worsened with discharge. X-rays showed soft tissue swelling. Patient was admitted for further management of left foot cellulitis with abscess. Patient had decreased sensation in the lower extremity. Also had noticed increasing swelling. Denied any fever or chills.  Hospital Course:  #1 left great toe osteomyelitis/left foot cellulitis Patient was admitted with a left foot cellulitis and concerns for left great toe abscess. Plain films which were done showed soft tissue swelling. Patient was placed empirically on IV vancomycin and IV Zosyn. MRI of the foot obtained was consistent with  cellulitis of the left foot and osteomyelitis throughout the distal phalanx of the great toe. Negative for abscess or septic joint. Orthopedics was consulted and patient was seen in consultation by Dr. Sharol Given who recommended left great toe amputation. Patient subsequently underwent left great toe amputation 08/03/2015 per Dr. Sharol Given. Patient remained afebrile and leukocytosis resolved. Lower extremity Dopplers negative for DVT. Patient improved clinically and patient will be discharged home on Bactrim DS 1 tab twice a day 2 weeks. Patient will follow-up with orthopedics as outpatient one week post discharge.  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 Continued on home regimen of glipizide. Patient's metformin was held during the hospitalization and be resumed on discharge.   #3 hypertension Stable. Continued on home regimen of Norvasc, Coreg, lisinopril.  #4 chronic anemia H&H stable. Continued on ferrous sulfate.   Procedures:  X-ray of the left foot 08/01/2015  X-ray of the right ankle 08/02/2015  MRI of the left foot 08/02/2015  Lower extremity Dopplers 08/03/2015  Amputation left great toe or Dr. Sharol Given 08/03/2015    Consultations:  Orthopedics: Dr. Sharol Given 08/02/2015  Discharge Exam: Filed Vitals:   08/04/15 0112 08/04/15 0646  BP: 147/84 133/81  Pulse: 82 82  Temp: 98 F (36.7 C) 98.4 F (36.9 C)  Resp: 16 16    General: NAD Cardiovascular: RRR Respiratory: CTAB  Discharge Instructions   Discharge Instructions    Diet Carb Modified    Complete by:  As directed      Discharge instructions    Complete by:  As directed   Follow up with Minerva Ends, MD in 1-2 weeks. Follow up with Dr  Duda in 1 week.     Increase activity slowly    Complete by:  As directed   Non weight bearing left foot. Use postop shoe.     Non weight bearing    Complete by:  As directed   Laterality:  left  Extremity:  Lower          Current Discharge Medication  List    START taking these medications   Details  sulfamethoxazole-trimethoprim (BACTRIM DS,SEPTRA DS) 800-160 MG tablet Take 1 tablet by mouth every 12 (twelve) hours. Qty: 28 tablet, Refills: 0      CONTINUE these medications which have CHANGED   Details  atorvastatin (LIPITOR) 80 MG tablet Take 1 tablet (80 mg total) by mouth every morning. Qty: 30 tablet, Refills: 12    oxyCODONE-acetaminophen (PERCOCET/ROXICET) 5-325 MG tablet Take 1-2 tablets by mouth every 6 (six) hours as needed for severe pain. Qty: 20 tablet, Refills: 0      CONTINUE these medications which have NOT CHANGED   Details  amLODipine (NORVASC) 5 MG tablet TAKE 1 TABLET BY MOUTH DAILY Qty: 30 tablet, Refills: 0    carvedilol (COREG) 12.5 MG tablet TAKE 1 TABLET BY MOUTH 2 TIMES DAILY WITH A MEAL Qty: 60 tablet, Refills: 3    divalproex (DEPAKOTE) 500 MG DR tablet Take 1 tablet (500 mg total) by mouth at bedtime. Qty: 30 tablet, Refills: 0    ferrous sulfate 325 (65 FE) MG tablet Take 1 tablet (325 mg total) by mouth 2 (two) times daily with a meal. Qty: 60 tablet, Refills: 3   Associated Diagnoses: Lower GI bleed; Anemia, unspecified anemia type    glipiZIDE (GLUCOTROL) 10 MG tablet Take 1 tablet (10 mg total) by mouth 2 (two) times daily before a meal. Qty: 60 tablet, Refills: 5   Associated Diagnoses: Type 2 diabetes mellitus with other neurologic complication (HCC)    ibuprofen (ADVIL,MOTRIN) 200 MG tablet Take 400 mg by mouth every 6 (six) hours as needed for headache (pain).    lisinopril (PRINIVIL,ZESTRIL) 40 MG tablet Take 1 tablet (40 mg total) by mouth daily. Qty: 90 tablet, Refills: 3   Associated Diagnoses: Essential hypertension    metFORMIN (GLUCOPHAGE) 1000 MG tablet Take 1 tablet (1,000 mg total) by mouth 2 (two) times daily with a meal. Qty: 60 tablet, Refills: 5   Associated Diagnoses: Type 2 diabetes mellitus with other neurologic complication (HCC)    omeprazole (PRILOSEC) 20 MG  capsule Take 1 capsule (20 mg total) by mouth daily. Qty: 30 capsule, Refills: 3   Associated Diagnoses: Dyspepsia    Vitamin D, Ergocalciferol, (DRISDOL) 50000 UNITS CAPS capsule Take 1 capsule (50,000 Units total) by mouth every 7 (seven) days. Qty: 12 capsule, Refills: 0       Allergies  Allergen Reactions  . Eggs Or Egg-Derived Products Diarrhea  . Other Other (See Comments)    Red meat causes stomach pains, bloating and diarrhea  . Milk-Related Compounds Diarrhea and Other (See Comments)    Any dairy products  - diarrhea and bloating   Follow-up Information    Follow up with DUDA,MARCUS V, MD In 1 week.   Specialty:  Orthopedic Surgery   Contact information:   [place] [id number] [phone]       Follow up with Minerva Ends, MD. Schedule an appointment as soon as possible for a visit in 2 weeks.   Specialty:  Family Medicine   Why:  f/u in 1-2 weeks  Contact information:   [place] [id number] [phone]        The results of significant diagnostics from this hospitalization (including imaging, microbiology, ancillary and laboratory) are listed below for reference.    Significant Diagnostic Studies: Dg Ankle Complete Right  08/02/2015  CLINICAL DATA:  Swelling over RIGHT ankle, few days duration, no injury, history hypertension, diabetes mellitus, chronic diastolic CHF EXAM: RIGHT ANKLE - COMPLETE 3+ VIEW COMPARISON:  None FINDINGS: Osseous mineralization normal. Joint spaces preserved. Lateral soft tissue swelling. No acute fracture, dislocation or bone destruction. Atherosclerotic calcifications of the dorsalis pedis and posterior tibial arteries extending into foot. IMPRESSION: No acute osseous abnormalities. Small vessel vascular calcification likely related to history of diabetes mellitus. Electronically Signed   By: Lavonia Dana M.D.   On: 08/02/2015 10:14   Mr Foot Left Wo Contrast  08/02/2015  CLINICAL DATA:   Left foot and great toe swelling for approximately 10 days in a diabetic patient. Wound with discharge on the left great toe. EXAM: MRI OF THE LEFT FOREFOOT WITHOUT CONTRAST TECHNIQUE: Multiplanar, multisequence MR imaging was performed. No intravenous contrast was administered. COMPARISON:  Plain films left foot 08/01/2015. FINDINGS: There is increased T2 signal with corresponding decreased T1 signal throughout the distal phalanx of the great toe consistent with osteomyelitis. There is some first MTP degenerative disease with mild subchondral edema seen in the medial periphery of the first metatarsal head. Bone marrow signal is otherwise unremarkable. Only a trace amount of fluid is seen in the IP joint of the great toe. The patient has a very small first MTP joint effusion. Soft tissues demonstrate diffuse subcutaneous edema which is worst over the dorsum of the foot and about the great toe. No abscess is identified. Intrinsic musculature of the foot appears intact. There is no mass. IMPRESSION: Findings consistent with cellulitis of the left foot and osteomyelitis throughout the distal phalanx of the great toe. Negative for abscess or septic joint. Mild first MTP osteoarthritis. Electronically Signed   By: Inge Rise M.D.   On: 08/02/2015 11:29   Dg Foot Complete Left  08/01/2015  CLINICAL DATA:  45 year old male with abscess of the great toe. EXAM: LEFT FOOT - COMPLETE 3+ VIEW COMPARISON:  Radiograph dated 07/28/2015 go FINDINGS: Stable appearing linear lucency with distal portion of the proximal phalanx of the fifth digit may represent an age indeterminate fracture. No new fracture identified. There is diffuse soft tissue swelling of the foot. No radiopaque foreign object or soft tissue gas. IMPRESSION: Diffuse soft tissue swelling of the foot. No radiopaque foreign object or soft tissue gas. Electronically Signed   By: Anner Crete M.D.   On: 08/01/2015 20:57   Dg Foot Complete Left  07/28/2015   CLINICAL DATA:  Left great toe infection over the past 2 days. Diabetes. EXAM: LEFT FOOT - COMPLETE 3+ VIEW COMPARISON:  07/15/2015 FINDINGS: Small erosion or degenerative subcortical cystic lesion medially along the distal first metatarsal head. Atherosclerotic vascular calcifications. There is an oblique fracture extending from the medial portion of the distal metaphysis to the distal articular surface of the proximal phalanx of the small toe. No malalignment at the Lisfranc joint. Os trigonum noted clear. Achilles calcaneal spur. Dorsal spurring of the talonavicular articulation. IMPRESSION: 1. Subcortical cyst or gouty erosion of the medial margin of the head of the first metatarsal, no change from 07/15/2015. This would be unlikely to be due to osteomyelitis based on the appearance. 2. I suspect oblique fracture  of the distal portion of the proximal phalanx of the small toe, extending into the interphalangeal joint. Electronically Signed   By: Van Clines M.D.   On: 07/28/2015 14:29   Dg Toe Great Left  07/15/2015  CLINICAL DATA:  Left 8 toe wound for 1 week with pain. EXAM: LEFT GREAT TOE COMPARISON:  None. FINDINGS: Three views study shows soft tissue swelling of the great toe without underlying bony abnormality. Vascular calcifications suggest diabetes. There is a small bony erosion at the head of the first metatarsal which is probably chronic but correlation for overlying soft tissue infection recommended. IMPRESSION: No evidence for fracture or bony destruction in the phalanges the great toe. There is a cortical erosion in the head of the first metatarsal, likely chronic although with the soft tissue wound is in this region, osteomyelitis cannot be excluded. Electronically Signed   By: Misty Stanley M.D.   On: 07/15/2015 17:44    Microbiology: Recent Results (from the past 240 hour(s))  Surgical pcr screen     Status: None   Collection Time: 08/02/15  9:22 PM  Result Value Ref Range  Status   MRSA, PCR NEGATIVE NEGATIVE Final   Staphylococcus aureus NEGATIVE NEGATIVE Final    Comment:        The Xpert SA Assay (FDA approved for NASAL specimens in patients over 7 years of age), is one component of a comprehensive surveillance program.  Test performance has been validated by [facility] for patients greater than or equal to 83 year old. It is not intended to diagnose infection nor to guide or monitor treatment.      Labs: Basic Metabolic Panel:  Recent Labs Lab 07/28/15 1151 08/01/15 1714 08/02/15 1346  NA 140 143 142  K 4.5 4.4 4.5  CL 104 106 105  CO2 27 28 29   GLUCOSE 203* 183* 213*  BUN 21* 17 11  CREATININE 0.94 0.94 0.90  CALCIUM 9.0 9.1 9.1   Liver Function Tests: No results for input(s): AST, ALT, ALKPHOS, BILITOT, PROT, ALBUMIN in the last 168 hours. No results for input(s): LIPASE, AMYLASE in the last 168 hours. No results for input(s): AMMONIA in the last 168 hours. CBC:  Recent Labs Lab 07/28/15 1151 08/01/15 1714 08/02/15 1346 08/04/15 0821  WBC 11.4* 9.0 7.6 9.2  NEUTROABS 8.5*  --   --   --   HGB 9.0* 9.0* 9.3* 9.6*  HCT 26.5* 27.4* 28.3* 30.1*  MCV 93.3 94.2 94.3 92.9  PLT 271 327 333 359   Cardiac Enzymes: No results for input(s): CKTOTAL, CKMB, CKMBINDEX, TROPONINI in the last 168 hours. BNP: BNP (last 3 results)  Recent Labs  11/29/14 2252 08/01/15 2106  BNP 39.3 34.7    ProBNP (last 3 results) No results for input(s): PROBNP in the last 8760 hours.  CBG:  Recent Labs Lab 08/03/15 0705 08/03/15 1109 08/03/15 1657 08/03/15 2010 08/04/15 0645  GLUCAP 174* 126* 140* 89 288*       Signed:  THOMPSON,DANIEL MD.  [facility] 08/04/2015, 9:37 AM

## 2015-08-04 NOTE — Progress Notes (Signed)
Patient ID: Shane [name], male   DOB: 1971/04/10, 45 y.o.   MRN: [id number] Postoperative day 1 great toe amputation. Patient is doing well no complaints plan for discharge to home once he is safe with physical therapy. I will follow-up in 1 week.

## 2015-08-04 NOTE — Evaluation (Signed)
Physical Therapy Evaluation Patient Details Name: Shane [name] MRN: [id number] DOB: 09/26/1970 Today's Date: 08/04/2015   History of Present Illness  45 yo admitted with left foot abscess s/p great toe amputation. PMHx: TIA, HTN, DM, bipolar, CAD  Clinical Impression  Pt very pleasant but slightly apprehensive about mobility. Pt educated for NWB status, gait, transfers and stairs with pt able to return demonstrate all. Pt educated for LLE HEP to maintain strength while NWB as well as progression of gait. Pt with decreased gait, function and mobility who will benefit from acute therapy to maximize mobility and independence. Pt able to don post op shoe without assist.     Follow Up Recommendations No PT follow up    Equipment Recommendations  Rolling walker with 5" wheels    Recommendations for Other Services       Precautions / Restrictions Precautions Precautions: Fall Restrictions Weight Bearing Restrictions: Yes LLE Weight Bearing: Non weight bearing      Mobility  Bed Mobility Overal bed mobility: Modified Independent                Transfers Overall transfer level: Needs assistance   Transfers: Sit to/from Stand Sit to Stand: Supervision         General transfer comment: cues for hand placement, sequence and safety  Ambulation/Gait Ambulation/Gait assistance: Supervision Ambulation Distance (Feet): 60 Feet Assistive device: Rolling walker (2 wheeled) Gait Pattern/deviations: Step-to pattern;Decreased stride length   Gait velocity interpretation: Below normal speed for age/gender General Gait Details: cues for RW use sequence and safety. Encouraged shoe on RLE to absorb impact  Stairs Stairs: Yes Stairs assistance: Min assist Stair Management: With walker;Backwards Number of Stairs: 4 General stair comments: cues for sequence with assist for RW stability and Handout provided  Wheelchair Mobility    Modified Rankin (Stroke Patients Only)        Balance                                             Pertinent Vitals/Pain Pain Assessment: 0-10 Pain Score: 8  Pain Location: left foot Pain Descriptors / Indicators: Aching;Throbbing Pain Intervention(s): Limited activity within patient's tolerance;Premedicated before session;Repositioned    Home Living Family/patient expects to be discharged to:: Private residence Living Arrangements: Spouse/significant other Available Help at Discharge: Family;Available 24 hours/day Type of Home: House Home Access: Stairs to enter Entrance Stairs-Rails: Right Entrance Stairs-Number of Steps: 3 Home Layout: One level Home Equipment: None      Prior Function Level of Independence: Independent         Comments: Works at [employer] in [employer]        Extremity/Trunk Assessment   Upper Extremity Assessment: Overall WFL for tasks assessed           Lower Extremity Assessment: Overall WFL for tasks assessed      Cervical / Trunk Assessment: Normal  Communication   Communication: No difficulties  Cognition Arousal/Alertness: Awake/alert Behavior During Therapy: WFL for tasks assessed/performed Overall Cognitive Status: Within Functional Limits for tasks assessed                      General Comments      Exercises        Assessment/Plan    PT Assessment Patient needs continued PT services  PT Diagnosis Difficulty walking;Acute  pain   PT Problem List Decreased activity tolerance;Decreased balance;Pain;Decreased knowledge of use of DME;Decreased mobility  PT Treatment Interventions Gait training;Stair training;Functional mobility training;Therapeutic activities;Therapeutic exercise;Patient/family education;DME instruction   PT Goals (Current goals can be found in the Care Plan section) Acute Rehab PT Goals Patient Stated Goal: return to work PT Goal Formulation: With patient Time For Goal Achievement:  08/11/15 Potential to Achieve Goals: Good    Frequency Min 3X/week   Barriers to discharge        Co-evaluation               End of Session Equipment Utilized During Treatment: Gait belt;Other (comment) (post op shoe) Activity Tolerance: Patient tolerated treatment well Patient left: in chair;with call bell/phone within reach Nurse Communication: Mobility status;Weight bearing status         Time: [id number] PT Time Calculation (min) (ACUTE ONLY): 22 min   Charges:   PT Evaluation $PT Eval Moderate Complexity: 1 Procedure     PT G CodesMelford Aase 08/04/2015, 9:23 AM Elwyn Reach, [place]

## 2015-08-07 ENCOUNTER — Encounter (HOSPITAL_COMMUNITY): Payer: Self-pay | Admitting: Orthopedic Surgery

## 2015-08-09 ENCOUNTER — Other Ambulatory Visit: Payer: Self-pay | Admitting: Family Medicine

## 2015-08-09 MED FILL — CARVEDILOL 12.5 MG TABLET: 12.5 | 30 days supply | Qty: 60 | Fill #2

## 2015-08-09 MED FILL — ATORVASTATIN 80 MG TABLET: 80 | 30 days supply | Qty: 30 | Fill #4

## 2015-08-09 MED FILL — FERROUS SULFATE 325 MG TAB: 325 (65 FE) | 30 days supply | Qty: 60 | Fill #1

## 2015-08-13 ENCOUNTER — Emergency Department (HOSPITAL_COMMUNITY)
Admission: EM | Admit: 2015-08-13 | Discharge: 2015-08-13 | Disposition: A | Discharge status: Home / Self Care | Payer: Managed Care, Other (non HMO) | Attending: Emergency Medicine | Admitting: Emergency Medicine

## 2015-08-13 ENCOUNTER — Encounter (HOSPITAL_COMMUNITY): Payer: Self-pay | Admitting: *Deleted

## 2015-08-13 ENCOUNTER — Emergency Department (HOSPITAL_COMMUNITY): Payer: Managed Care, Other (non HMO)

## 2015-08-13 DIAGNOSIS — Z79899 Other long term (current) drug therapy: Secondary | ICD-10-CM | POA: Diagnosis not present

## 2015-08-13 DIAGNOSIS — Z7984 Long term (current) use of oral hypoglycemic drugs: Secondary | ICD-10-CM | POA: Diagnosis not present

## 2015-08-13 DIAGNOSIS — F319 Bipolar disorder, unspecified: Secondary | ICD-10-CM | POA: Insufficient documentation

## 2015-08-13 DIAGNOSIS — F419 Anxiety disorder, unspecified: Secondary | ICD-10-CM | POA: Diagnosis not present

## 2015-08-13 DIAGNOSIS — W228XXA Striking against or struck by other objects, initial encounter: Secondary | ICD-10-CM | POA: Diagnosis not present

## 2015-08-13 DIAGNOSIS — Y658 Other specified misadventures during surgical and medical care: Secondary | ICD-10-CM | POA: Insufficient documentation

## 2015-08-13 DIAGNOSIS — Z89412 Acquired absence of left great toe: Secondary | ICD-10-CM | POA: Diagnosis not present

## 2015-08-13 DIAGNOSIS — D649 Anemia, unspecified: Secondary | ICD-10-CM | POA: Insufficient documentation

## 2015-08-13 DIAGNOSIS — Y9289 Other specified places as the place of occurrence of the external cause: Secondary | ICD-10-CM | POA: Diagnosis not present

## 2015-08-13 DIAGNOSIS — S99922A Unspecified injury of left foot, initial encounter: Secondary | ICD-10-CM | POA: Insufficient documentation

## 2015-08-13 DIAGNOSIS — I5032 Chronic diastolic (congestive) heart failure: Secondary | ICD-10-CM | POA: Diagnosis not present

## 2015-08-13 DIAGNOSIS — K219 Gastro-esophageal reflux disease without esophagitis: Secondary | ICD-10-CM | POA: Diagnosis not present

## 2015-08-13 DIAGNOSIS — Y9389 Activity, other specified: Secondary | ICD-10-CM | POA: Diagnosis not present

## 2015-08-13 DIAGNOSIS — Z8669 Personal history of other diseases of the nervous system and sense organs: Secondary | ICD-10-CM | POA: Insufficient documentation

## 2015-08-13 DIAGNOSIS — Y998 Other external cause status: Secondary | ICD-10-CM | POA: Diagnosis not present

## 2015-08-13 DIAGNOSIS — M79672 Pain in left foot: Secondary | ICD-10-CM

## 2015-08-13 DIAGNOSIS — T8131XA Disruption of external operation (surgical) wound, not elsewhere classified, initial encounter: Secondary | ICD-10-CM | POA: Diagnosis not present

## 2015-08-13 DIAGNOSIS — I10 Essential (primary) hypertension: Secondary | ICD-10-CM | POA: Diagnosis not present

## 2015-08-13 DIAGNOSIS — Z8673 Personal history of transient ischemic attack (TIA), and cerebral infarction without residual deficits: Secondary | ICD-10-CM | POA: Diagnosis not present

## 2015-08-13 DIAGNOSIS — E119 Type 2 diabetes mellitus without complications: Secondary | ICD-10-CM | POA: Insufficient documentation

## 2015-08-13 LAB — CBG MONITORING, ED: Glucose-Capillary: 152 mg/dL — ABNORMAL HIGH (ref 65–99)

## 2015-08-13 MED ORDER — OXYCODONE-ACETAMINOPHEN 5-325 MG PO TABS
1.0000 | ORAL_TABLET | Freq: Once | ORAL | Status: AC
  Administered 2015-08-13: 1 via ORAL
  Filled 2015-08-13: qty 1

## 2015-08-13 NOTE — ED Provider Notes (Signed)
CSN: [id number]     Arrival date & time 08/13/15  1726 History   First MD Initiated Contact with Patient 08/13/15 1731     Chief Complaint  Patient presents with  . Foot Injury     (Consider location/radiation/quality/duration/timing/severity/associated sxs/prior Treatment) HPI   Odin Chadwick is a 45 y.o. male, with a history of diabetes and hypertension, presenting to the ED with left foot injury and surgical wound dehiscence. Pt had left great toe amputation 2/16 by Dr. Sharol Given, sutures removed 2/24, tripped and hit the toe the same day, causing the wound opening. Rates his pain 10/10, throbbing, nonradiating.  Pt has not taken anything for the pain today, but has Percocet at home. Patient states that he did not contact Dr. Jess Barters office after this occurred. Patient denies fever/chills, nausea/vomiting, pain extending up the leg, discharge from the wound, neuro deficits, or any other complaints.    Past Medical History  Diagnosis Date  . Hypertension     a. 08/2014 Admitted with hypertensive urgency.  . Chest pain     a. 2015 Reportedly normal stress test in [place].  Marland Kitchen TIA (transient ischemic attack) 08/2014; 03/2015    a. 08/2014 in setting of hypertensive urgency.  . Anemia   . Refusal of blood transfusions as patient is Jehovah's Witness   . Hyperlipidemia   . Chronic diastolic CHF (congestive heart failure) (HCC)     a.03/2015 Echo: EF 55-60%, Gr 1 DD, mild MR, triv PR.  . Type II diabetes mellitus ([place])   . Sleep apnea   . GERD (gastroesophageal reflux disease)   . Anxiety   . Depression   . Bipolar disorder [facility])    Past Surgical History  Procedure Laterality Date  . Inguinal hernia repair Bilateral ~ 1983- ~ 1986  . Tonsillectomy  ~ 1985  . Circumcision    . Amputation Left 08/03/2015    Procedure: AMPUTATION LEFT GREAT TOE;  Surgeon: Newt Minion, MD;  Location: [place];  Service: Orthopedics;  Laterality: Left;   Family History  Problem Relation Age of Onset  .  Hypertension Mother   . Diabetes Mother   . Hyperlipidemia Mother   . Heart disease Mother     s/p pacemaker  . Diabetes Father   . Hypertension Father   . Stroke Father   . Heart attack Father     first MI @ 10.  . Stroke Brother    Social History  Substance Use Topics  . Smoking status: Never Smoker   . Smokeless tobacco: Never Used  . Alcohol Use: No    Review of Systems  Skin: Positive for wound (Surgical wound dehiscence).  Neurological: Negative for numbness.      Allergies  Eggs or egg-derived products; Other; and Milk-related compounds  Home Medications   Prior to Admission medications   Medication Sig Start Date End Date Taking? Authorizing Provider  amLODipine (NORVASC) 5 MG tablet TAKE 1 TABLET BY MOUTH DAILY 07/18/15   Boykin Nearing, MD  atorvastatin (LIPITOR) 80 MG tablet Take 1 tablet (80 mg total) by mouth every morning. 08/04/15   Eugenie Filler, MD  carvedilol (COREG) 12.5 MG tablet TAKE 1 TABLET BY MOUTH 2 TIMES DAILY WITH A MEAL 05/17/15   Josalyn Funches, MD  divalproex (DEPAKOTE) 500 MG DR tablet Take 1 tablet (500 mg total) by mouth at bedtime. 04/04/15   Nita Sells, MD  ferrous sulfate 325 (65 FE) MG tablet Take 1 tablet (325 mg total) by mouth 2 (  two) times daily with a meal. 07/04/15   Josalyn Funches, MD  glipiZIDE (GLUCOTROL) 10 MG tablet Take 1 tablet (10 mg total) by mouth 2 (two) times daily before a meal. 07/04/15   Josalyn Funches, MD  ibuprofen (ADVIL,MOTRIN) 200 MG tablet Take 400 mg by mouth every 6 (six) hours as needed for headache (pain).    Historical Provider, MD  lisinopril (PRINIVIL,ZESTRIL) 40 MG tablet Take 1 tablet (40 mg total) by mouth daily. 07/04/15   Boykin Nearing, MD  metFORMIN (GLUCOPHAGE) 1000 MG tablet Take 1 tablet (1,000 mg total) by mouth 2 (two) times daily with a meal. 07/04/15   Josalyn Funches, MD  omeprazole (PRILOSEC) 20 MG capsule TAKE 1 CAPSULE BY MOUTH DAILY. 08/11/15   Boykin Nearing, MD    oxyCODONE-acetaminophen (PERCOCET/ROXICET) 5-325 MG tablet Take 1-2 tablets by mouth every 6 (six) hours as needed for severe pain. 08/04/15   Eugenie Filler, MD  sulfamethoxazole-trimethoprim (BACTRIM DS,SEPTRA DS) 800-160 MG tablet Take 1 tablet by mouth every 12 (twelve) hours. 08/04/15 08/18/15  Eugenie Filler, MD  Vitamin D, Ergocalciferol, (DRISDOL) 50000 UNITS CAPS capsule Take 1 capsule (50,000 Units total) by mouth every 7 (seven) days. Patient taking differently: Take 50,000 Units by mouth every 7 (seven) days. On Sundays 09/23/14   Lorayne Marek, MD   BP 127/76 mmHg  Pulse 88  Temp(Src) 98.2 F (36.8 C) (Oral)  Resp 18  SpO2 100% Physical Exam  Constitutional: He appears well-developed and well-nourished. No distress.  HENT:  Head: Normocephalic and atraumatic.  Eyes: Conjunctivae are normal.  Cardiovascular: Normal rate and regular rhythm.   Pulmonary/Chest: Effort normal.  Neurological: He is alert.  Skin: Skin is warm and dry. He is not diaphoretic.  First toe of the left foot has been surgically removed. Surgical scar appears well-healed. Small area of dehiscence of the first layer of scar tissue. No open space noted. No active hemorrhage. No discharge. Some tenderness to the surgical site.  Nursing note and vitals reviewed.   ED Course  Procedures (including critical care time) Labs Review Labs Reviewed  CBG MONITORING, ED    Imaging Review Dg Foot Complete Left  08/13/2015  CLINICAL DATA:  Reason great toe amputation 08/02/2016 2017. Stitches removed 08/11/2015. Fall today, opening the surgical site. Swelling. EXAM: LEFT FOOT - COMPLETE 3+ VIEW COMPARISON:  08/02/2015 FINDINGS: Amputation of the first ray at the MTP joint. Overlying soft tissues normal in thickness. No gas tracking within the soft tissues. Small subcortical cyst or erosion along the medial head of the first metatarsal, unchanged from prior exams. I do not see a fracture. Vascular calcifications  noted. No malalignment at the Lisfranc joint. Dorsal spurring of the navicular. IMPRESSION: 1. No acute findings radiographically. No gas tracking in the soft tissues or new fracture. Electronically Signed   By: Van Clines M.D.   On: 08/13/2015 18:24   I have personally reviewed and evaluated these images as part of my medical decision-making.   EKG Interpretation None        Note on the photo: The depth of the wound is very superficial, just as it appears in the photo.  MDM   Final diagnoses:  Left foot pain    Fatima Shimomura presents with a left foot injury and surgical wound dehiscence that occurred 2 days ago.  Findings and plan of care discussed with Lacretia Leigh, MD.  This patient's wound dehiscence is minor at worst, especially since this occurred 2 days ago. There is  healthy granulation tissue noted with only the top most layers of skin pushed back. There is no open space. There is no suturable area. No signs of infection. Patient is nontoxic appearing, not tachycardic upon my exam, is afebrile, and is in no apparent distress. I suspect that the patient's initial pulse of 101 was due to pain. Patient's vital signs returned to normal following Percocet administration. Patient will need to follow-up with Dr. Sharol Given tomorrow. Return precautions discussed. Patient voices understanding of these instructions. Patient voices no additional complaints upon discharge.   Filed Vitals:   08/13/15 1739 08/13/15 1840  BP: 141/88 127/76  Pulse: 101 88  Temp: 97.8 F (36.6 C) 98.2 F (36.8 C)  TempSrc: Oral Oral  Resp: 18 18  SpO2: 100% 100%       Lorayne Bender, PA-C 08/13/15 1857  Lacretia Leigh, MD 08/13/15 2150

## 2015-08-13 NOTE — Discharge Instructions (Signed)
You have been seen today for foot pain and surgical wound dehiscence. Your imaging showed no abnormalities. There are no signs of infection. The inside of the scar tissue appears to be healing well. Follow up with Dr. Sharol Given by calling his office tomorrow, Monday, February 27. Follow up with PCP as needed. Return to ED should symptoms worsen.

## 2015-08-13 NOTE — ED Notes (Signed)
Pt states he had a toe amputation 2/16. States he had his stitches out 2/24. States that he tripped and stepped on the area and thinks he reopened the area.

## 2015-08-15 ENCOUNTER — Encounter: Payer: Self-pay | Admitting: Clinical

## 2015-08-15 MED FILL — ?AMLODIPINE BESYLATE 5 MG T: 5 | 30 days supply | Qty: 30 | Fill #1

## 2015-08-15 NOTE — Progress Notes (Signed)
Depression screen [facility] 2/9 07/04/2015 04/07/2015 09/14/2014 08/31/2014  Decreased Interest 1 2 1  0  Down, Depressed, Hopeless 1 2 0 1  PHQ - 2 Score 2 4 1 1   Altered sleeping 2 3 - -  Tired, decreased energy 1 2 - -  Change in appetite 2 2 - -  Feeling bad or failure about yourself  1 3 - -  Trouble concentrating 1 2 - -  Moving slowly or fidgety/restless 1 1 - -  Suicidal thoughts 0 1 - -  PHQ-9 Score 10 18 - -    GAD 7 : Generalized Anxiety Score 07/04/2015  Nervous, Anxious, on Edge 0  Control/stop worrying 0  Worry too much - different things 0  Trouble relaxing 0  Restless 0  Easily annoyed or irritable 1  Afraid - awful might happen 1  Total GAD 7 Score 2    04-07-15: GAD7- 10 04-12-15: PHQ9- 4/ GAD7-0

## 2015-08-18 ENCOUNTER — Ambulatory Visit: Payer: Managed Care, Other (non HMO) | Attending: Family Medicine | Admitting: Family Medicine

## 2015-08-18 ENCOUNTER — Encounter: Payer: Self-pay | Admitting: Family Medicine

## 2015-08-18 VITALS — BP 95/62 | HR 78 | Temp 97.7°F | Resp 16 | Ht 67.0 in | Wt 167.0 lb

## 2015-08-18 DIAGNOSIS — F3481 Disruptive mood dysregulation disorder: Secondary | ICD-10-CM | POA: Diagnosis not present

## 2015-08-18 DIAGNOSIS — Z7984 Long term (current) use of oral hypoglycemic drugs: Secondary | ICD-10-CM | POA: Insufficient documentation

## 2015-08-18 DIAGNOSIS — K922 Gastrointestinal hemorrhage, unspecified: Secondary | ICD-10-CM | POA: Insufficient documentation

## 2015-08-18 DIAGNOSIS — Z89422 Acquired absence of other left toe(s): Secondary | ICD-10-CM

## 2015-08-18 DIAGNOSIS — S98132A Complete traumatic amputation of one left lesser toe, initial encounter: Secondary | ICD-10-CM

## 2015-08-18 DIAGNOSIS — R519 Headache, unspecified: Secondary | ICD-10-CM

## 2015-08-18 DIAGNOSIS — E1149 Type 2 diabetes mellitus with other diabetic neurological complication: Secondary | ICD-10-CM

## 2015-08-18 DIAGNOSIS — E119 Type 2 diabetes mellitus without complications: Secondary | ICD-10-CM | POA: Diagnosis not present

## 2015-08-18 DIAGNOSIS — I10 Essential (primary) hypertension: Secondary | ICD-10-CM | POA: Insufficient documentation

## 2015-08-18 DIAGNOSIS — L84 Corns and callosities: Secondary | ICD-10-CM

## 2015-08-18 DIAGNOSIS — R51 Headache: Secondary | ICD-10-CM | POA: Insufficient documentation

## 2015-08-18 DIAGNOSIS — K219 Gastro-esophageal reflux disease without esophagitis: Secondary | ICD-10-CM | POA: Diagnosis not present

## 2015-08-18 DIAGNOSIS — Z89412 Acquired absence of left great toe: Secondary | ICD-10-CM | POA: Diagnosis not present

## 2015-08-18 DIAGNOSIS — Z79899 Other long term (current) drug therapy: Secondary | ICD-10-CM | POA: Insufficient documentation

## 2015-08-18 DIAGNOSIS — D649 Anemia, unspecified: Secondary | ICD-10-CM | POA: Insufficient documentation

## 2015-08-18 DIAGNOSIS — E11628 Type 2 diabetes mellitus with other skin complications: Secondary | ICD-10-CM

## 2015-08-18 LAB — GLUCOSE, POCT (MANUAL RESULT ENTRY): POC GLUCOSE: 124 mg/dL — AB (ref 70–99)

## 2015-08-18 LAB — POCT GLYCOSYLATED HEMOGLOBIN (HGB A1C): Hemoglobin A1C: 7.1

## 2015-08-18 MED ORDER — ATORVASTATIN CALCIUM 80 MG PO TABS: 80.0000 mg | ORAL_TABLET | Freq: Every morning | ORAL | Status: DC

## 2015-08-18 MED ORDER — LISINOPRIL 40 MG PO TABS: 40.0000 mg | ORAL_TABLET | Freq: Every day | ORAL | Status: DC

## 2015-08-18 MED ORDER — IBUPROFEN 200 MG PO TABS: 400.0000 mg | ORAL_TABLET | Freq: Four times a day (QID) | ORAL | Status: DC | PRN

## 2015-08-18 MED ORDER — FERROUS SULFATE 325 (65 FE) MG PO TABS: 325.0000 mg | ORAL_TABLET | Freq: Two times a day (BID) | ORAL | Status: DC

## 2015-08-18 MED ORDER — OMEPRAZOLE 20 MG PO CPDR: 20.0000 mg | DELAYED_RELEASE_CAPSULE | Freq: Every day | ORAL | Status: DC

## 2015-08-18 MED ORDER — AMLODIPINE BESYLATE 5 MG PO TABS: 5.0000 mg | ORAL_TABLET | Freq: Every day | ORAL | Status: DC

## 2015-08-18 MED ORDER — METFORMIN HCL 1000 MG PO TABS: 1000.0000 mg | ORAL_TABLET | Freq: Two times a day (BID) | ORAL | Status: DC

## 2015-08-18 MED ORDER — DIVALPROEX SODIUM 500 MG PO DR TAB: 500.0000 mg | DELAYED_RELEASE_TABLET | Freq: Every day | ORAL | Status: DC

## 2015-08-18 MED ORDER — GLIPIZIDE 10 MG PO TABS: 10.0000 mg | ORAL_TABLET | Freq: Two times a day (BID) | ORAL | Status: DC

## 2015-08-18 MED FILL — glipiZIDE 10 MG TABS: 10 | 30 days supply | Qty: 60 | Fill #0

## 2015-08-18 MED FILL — LISINOPRIL 40 MG TABLET: 40 | 30 days supply | Qty: 30 | Fill #0

## 2015-08-18 MED FILL — OMEPRAZOLE DR 20 MG CAPSULE: 20 | 30 days supply | Qty: 30 | Fill #0

## 2015-08-18 MED FILL — DIVALPROEX SOD DR 500 MG TA: 500 | 30 days supply | Qty: 30 | Fill #0

## 2015-08-18 MED FILL — ?METFORMIN HCL 1,000 MG TAB: 1000 | 30 days supply | Qty: 60 | Fill #0

## 2015-08-18 NOTE — Progress Notes (Signed)
Subjective:  Patient ID: Shane [name], male    DOB: Oct 11, 1970  Age: 45 y.o. MRN: [id number]  CC: Follow-up   HPI Nishawn Herston presents for    1. CHRONIC DIABETES  Disease Monitoring  Blood Sugar Ranges:   Polyuria: no   Visual problems: no   Medication Compliance: yes  Medication Side Effects  Hypoglycemia: no   Preventitive Health Care  Eye Exam: due   Foot Exam: don today    2. L great toe amputation:  Following cellulitis and osteomyelitis. 2 weeks post-op. He has minimal pain. Has seen ortho twice in outpatient follow up. Almost done to bactrim. Changing dressing regularly. Scant yellow tinged drainage. No pus. Post-op swelling improving. Requesting letter for work as he works 10-12 hrs standing at [employer] and does not believe he can currently do the job. No sitting positions available.   3. Chronic HTN: taking norvasc and lisinopril. BP low today. Patient does not report dizziness or lightheadedness.    Social History  Substance Use Topics  . Smoking status: Never Smoker   . Smokeless tobacco: Never Used  . Alcohol Use: No   Past Surgical History  Procedure Laterality Date  . Inguinal hernia repair Bilateral ~ 1983- ~ 1986  . Tonsillectomy  ~ 1985  . Circumcision    . Amputation Left 08/03/2015    Procedure: AMPUTATION LEFT GREAT TOE;  Surgeon: Newt Minion, MD;  Location: [place];  Service: Orthopedics;  Laterality: Left;   Outpatient Prescriptions Prior to Visit  Medication Sig Dispense Refill  . amLODipine (NORVASC) 5 MG tablet TAKE 1 TABLET BY MOUTH DAILY 30 tablet 0  . atorvastatin (LIPITOR) 80 MG tablet Take 1 tablet (80 mg total) by mouth every morning. 30 tablet 12  . carvedilol (COREG) 12.5 MG tablet TAKE 1 TABLET BY MOUTH 2 TIMES DAILY WITH A MEAL 60 tablet 3  . divalproex (DEPAKOTE) 500 MG DR tablet Take 1 tablet (500 mg total) by mouth at bedtime. 30 tablet 0  . ferrous sulfate 325 (65 FE) MG tablet Take 1 tablet (325 mg total) by mouth 2  (two) times daily with a meal. 60 tablet 3  . glipiZIDE (GLUCOTROL) 10 MG tablet Take 1 tablet (10 mg total) by mouth 2 (two) times daily before a meal. 60 tablet 5  . ibuprofen (ADVIL,MOTRIN) 200 MG tablet Take 400 mg by mouth every 6 (six) hours as needed for headache (pain).    Marland Kitchen lisinopril (PRINIVIL,ZESTRIL) 40 MG tablet Take 1 tablet (40 mg total) by mouth daily. 90 tablet 3  . metFORMIN (GLUCOPHAGE) 1000 MG tablet Take 1 tablet (1,000 mg total) by mouth 2 (two) times daily with a meal. 60 tablet 5  . omeprazole (PRILOSEC) 20 MG capsule TAKE 1 CAPSULE BY MOUTH DAILY. 30 capsule 2  . oxyCODONE-acetaminophen (PERCOCET/ROXICET) 5-325 MG tablet Take 1-2 tablets by mouth every 6 (six) hours as needed for severe pain. 20 tablet 0  . sulfamethoxazole-trimethoprim (BACTRIM DS,SEPTRA DS) 800-160 MG tablet Take 1 tablet by mouth every 12 (twelve) hours. 28 tablet 0  . Vitamin D, Ergocalciferol, (DRISDOL) 50000 UNITS CAPS capsule Take 1 capsule (50,000 Units total) by mouth every 7 (seven) days. (Patient not taking: Reported on 08/18/2015) 12 capsule 0   No facility-administered medications prior to visit.    ROS Review of Systems  Constitutional: Negative for fever, chills, fatigue and unexpected weight change.  Eyes: Negative for visual disturbance.  Respiratory: Negative for cough and shortness of breath.   Cardiovascular:  Negative for chest pain, palpitations and leg swelling.  Gastrointestinal: Negative for nausea, vomiting, abdominal pain, diarrhea, constipation and blood in stool.  Endocrine: Negative for polydipsia, polyphagia and polyuria.  Musculoskeletal: Negative for myalgias, back pain, arthralgias, gait problem and neck pain.  Skin: Positive for wound. Negative for rash.  Allergic/Immunologic: Negative for immunocompromised state.  Hematological: Negative for adenopathy. Does not bruise/bleed easily.  Psychiatric/Behavioral: Negative for suicidal ideas, sleep disturbance and dysphoric  mood. The patient is not nervous/anxious.     Objective:  BP 95/62 mmHg  Pulse 78  Temp(Src) 97.7 F (36.5 C) (Oral)  Resp 16  Ht 5\' 7"  (1.702 m)  Wt 167 lb (75.751 kg)  BMI 26.15 kg/m2  SpO2 100%  BP/Weight 08/18/2015 08/13/2015 [id number]  Systolic BP 95 [id number]  Diastolic BP 62 76 81  Wt. (Lbs) 167 - -  BMI 26.15 - -   Physical Exam  Constitutional: He appears well-developed and well-nourished. No distress.  HENT:  Head: Normocephalic and atraumatic.  Neck: Normal range of motion. Neck supple.  Cardiovascular:  Pulses:      Dorsalis pedis pulses are 1+ on the right side, and 2+ on the left side.       Posterior tibial pulses are 1+ on the right side, and 2+ on the left side.  Pulmonary/Chest: Effort normal and breath sounds normal.  Musculoskeletal: He exhibits no edema.       Feet:  Neurological: He is alert.  Skin: Skin is warm and dry. No rash noted. No erythema.  Psychiatric: He has a normal mood and affect.     Assessment & Plan:   Andris was seen today for follow-up.  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 -     glipiZIDE (GLUCOTROL) 10 MG tablet; Take 1 tablet (10 mg total) by mouth 2 (two) times daily before a meal. -     metFORMIN (GLUCOPHAGE) 1000 MG tablet; Take 1 tablet (1,000 mg total) by mouth 2 (two) times daily with a meal.  Lower GI bleed -     ferrous sulfate 325 (65 FE) MG tablet; Take 1 tablet (325 mg total) by mouth 2 (two) times daily with a meal.  Anemia, unspecified anemia type -     ferrous sulfate 325 (65 FE) MG tablet; Take 1 tablet (325 mg total) by mouth 2 (two) times daily with a meal.  Essential hypertension -     lisinopril (PRINIVIL,ZESTRIL) 40 MG tablet; Take 1 tablet (40 mg total) by mouth daily. -     Discontinue: amLODipine  (NORVASC) 5 MG tablet; Take 1 tablet (5 mg total) by mouth daily.  Gastroesophageal reflux disease without esophagitis -     omeprazole (PRILOSEC) 20 MG capsule; Take 1 capsule (20 mg total) by mouth daily.  DMDD (disruptive mood dysregulation disorder) (HCC) -     divalproex (DEPAKOTE) 500 MG DR tablet; Take 1 tablet (500 mg total) by mouth at bedtime.  Nonintractable headache, unspecified chronicity pattern, unspecified headache type -     ibuprofen (ADVIL,MOTRIN) 200 MG tablet; Take 2 tablets (400 mg total) by mouth every 6 (six) hours as needed for headache (pain).  Amputated toe of left foot ([place])  Type 2 diabetes mellitus with pressure callus (HCC) -  Ambulatory referral to Podiatry    No orders of the defined types were placed in this encounter.    Follow-up: No Follow-up on file.   Boykin Nearing MD

## 2015-08-18 NOTE — Progress Notes (Signed)
F/U toe surgery  Pain scale #4 No tobacco user  No suicidal thoughts in the past two weeks

## 2015-08-18 NOTE — Assessment & Plan Note (Signed)
A; improved. A1c at goal P: Continue current regimen

## 2015-08-18 NOTE — Assessment & Plan Note (Signed)
A: over treated P: Stop norvasc due to low BP

## 2015-08-18 NOTE — Patient Instructions (Addendum)
Shane [name] was seen today for follow-up.  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 -     glipiZIDE (GLUCOTROL) 10 MG tablet; Take 1 tablet (10 mg total) by mouth 2 (two) times daily before a meal. -     metFORMIN (GLUCOPHAGE) 1000 MG tablet; Take 1 tablet (1,000 mg total) by mouth 2 (two) times daily with a meal.  Lower GI bleed -     ferrous sulfate 325 (65 FE) MG tablet; Take 1 tablet (325 mg total) by mouth 2 (two) times daily with a meal.  Anemia, unspecified anemia type -     ferrous sulfate 325 (65 FE) MG tablet; Take 1 tablet (325 mg total) by mouth 2 (two) times daily with a meal.  Essential hypertension -     lisinopril (PRINIVIL,ZESTRIL) 40 MG tablet; Take 1 tablet (40 mg total) by mouth daily. -     Discontinue: amLODipine (NORVASC) 5 MG tablet; Take 1 tablet (5 mg total) by mouth daily.  Gastroesophageal reflux disease without esophagitis -     omeprazole (PRILOSEC) 20 MG capsule; Take 1 capsule (20 mg total) by mouth daily.  DMDD (disruptive mood dysregulation disorder) (HCC) -     divalproex (DEPAKOTE) 500 MG DR tablet; Take 1 tablet (500 mg total) by mouth at bedtime.  Nonintractable headache, unspecified chronicity pattern, unspecified headache type -     ibuprofen (ADVIL,MOTRIN) 200 MG tablet; Take 2 tablets (400 mg total) by mouth every 6 (six) hours as needed for headache (pain).   Your blood pressure is low today, stop amlodipine 5 mg daily  F/u in 3 weeks for HTN   Dr. Adrian Blackwater

## 2015-08-18 NOTE — Assessment & Plan Note (Addendum)
A: healing w/o s/s of post-op complications. No evidence of ongoing osteomyelitis or cellulitis  P: Finish bactrim Letter for work provided

## 2015-08-23 ENCOUNTER — Encounter: Payer: Self-pay | Admitting: Clinical

## 2015-08-23 NOTE — Progress Notes (Signed)
Depression screen [facility] 2/9 08/18/2015 07/04/2015 04/07/2015 09/14/2014 08/31/2014  Decreased Interest 0 1 2 1  0  Down, Depressed, Hopeless 2 1 2  0 1  PHQ - 2 Score 2 2 4 1 1   Altered sleeping 2 2 3  - -  Tired, decreased energy 2 1 2  - -  Change in appetite 2 2 2  - -  Feeling bad or failure about yourself  2 1 3  - -  Trouble concentrating 2 1 2  - -  Moving slowly or fidgety/restless 2 1 1  - -  Suicidal thoughts 0 0 1 - -  PHQ-9 Score 14 10 18  - -    GAD 7 : Generalized Anxiety Score 08/18/2015 07/04/2015  Nervous, Anxious, on Edge 2 0  Control/stop worrying 2 0  Worry too much - different things 2 0  Trouble relaxing 2 0  Restless 2 0  Easily annoyed or irritable 2 1  Afraid - awful might happen 2 1  Total GAD 7 Score 14 2

## 2015-08-26 ENCOUNTER — Emergency Department (HOSPITAL_COMMUNITY): Payer: 59

## 2015-08-26 ENCOUNTER — Emergency Department (HOSPITAL_COMMUNITY)
Admission: EM | Admit: 2015-08-26 | Discharge: 2015-08-26 | Disposition: A | Discharge status: Home / Self Care | Payer: 59 | Attending: Emergency Medicine | Admitting: Emergency Medicine

## 2015-08-26 ENCOUNTER — Encounter (HOSPITAL_COMMUNITY): Payer: Self-pay | Admitting: Emergency Medicine

## 2015-08-26 DIAGNOSIS — R0602 Shortness of breath: Secondary | ICD-10-CM | POA: Insufficient documentation

## 2015-08-26 DIAGNOSIS — E119 Type 2 diabetes mellitus without complications: Secondary | ICD-10-CM | POA: Insufficient documentation

## 2015-08-26 DIAGNOSIS — Z7984 Long term (current) use of oral hypoglycemic drugs: Secondary | ICD-10-CM | POA: Insufficient documentation

## 2015-08-26 DIAGNOSIS — R110 Nausea: Secondary | ICD-10-CM | POA: Insufficient documentation

## 2015-08-26 DIAGNOSIS — G459 Transient cerebral ischemic attack, unspecified: Secondary | ICD-10-CM

## 2015-08-26 DIAGNOSIS — F419 Anxiety disorder, unspecified: Secondary | ICD-10-CM | POA: Insufficient documentation

## 2015-08-26 DIAGNOSIS — I5032 Chronic diastolic (congestive) heart failure: Secondary | ICD-10-CM | POA: Insufficient documentation

## 2015-08-26 DIAGNOSIS — I10 Essential (primary) hypertension: Secondary | ICD-10-CM | POA: Insufficient documentation

## 2015-08-26 DIAGNOSIS — K219 Gastro-esophageal reflux disease without esophagitis: Secondary | ICD-10-CM | POA: Insufficient documentation

## 2015-08-26 DIAGNOSIS — Z79899 Other long term (current) drug therapy: Secondary | ICD-10-CM | POA: Insufficient documentation

## 2015-08-26 DIAGNOSIS — E785 Hyperlipidemia, unspecified: Secondary | ICD-10-CM | POA: Insufficient documentation

## 2015-08-26 DIAGNOSIS — F319 Bipolar disorder, unspecified: Secondary | ICD-10-CM | POA: Insufficient documentation

## 2015-08-26 DIAGNOSIS — D649 Anemia, unspecified: Secondary | ICD-10-CM | POA: Insufficient documentation

## 2015-08-26 DIAGNOSIS — R0789 Other chest pain: Secondary | ICD-10-CM

## 2015-08-26 LAB — BASIC METABOLIC PANEL
ANION GAP: 11 (ref 5–15)
BUN: 17 mg/dL (ref 6–20)
CALCIUM: 9.1 mg/dL (ref 8.9–10.3)
CHLORIDE: 106 mmol/L (ref 101–111)
CO2: 23 mmol/L (ref 22–32)
Creatinine, Ser: 0.96 mg/dL (ref 0.61–1.24)
GFR calc Af Amer: >60 mL/min (ref >60)
GFR calc non Af Amer: >60 mL/min (ref >60)
GLUCOSE: 91 mg/dL (ref 65–99)
POTASSIUM: 4.7 mmol/L (ref 3.5–5.1)
Sodium: 140 mmol/L (ref 135–145)

## 2015-08-26 LAB — CBC
HEMATOCRIT: 32.7 % — AB (ref 39.0–52.0)
HEMOGLOBIN: 10.2 g/dL — AB (ref 13.0–17.0)
MCH: 29.7 pg (ref 26.0–34.0)
MCHC: 31.2 g/dL (ref 30.0–36.0)
MCV: 95.1 fL (ref 78.0–100.0)
Platelets: 214 10*3/uL (ref 150–400)
RBC: 3.44 MIL/uL — ABNORMAL LOW (ref 4.22–5.81)
RDW: 13.7 % (ref 11.5–15.5)
WBC: 6.3 10*3/uL (ref 4.0–10.5)

## 2015-08-26 LAB — I-STAT TROPONIN, ED
Troponin i, poc: 0 ng/mL (ref 0.00–0.08)
Troponin i, poc: 0 ng/mL (ref 0.00–0.08)

## 2015-08-26 MED ORDER — ACETAMINOPHEN 325 MG PO TABS
650.0000 mg | ORAL_TABLET | Freq: Once | ORAL | Status: AC
  Administered 2015-08-26: 650 mg via ORAL
  Filled 2015-08-26: qty 2

## 2015-08-26 MED ORDER — GADOBENATE DIMEGLUMINE 529 MG/ML IV SOLN
15.0000 mL | Freq: Once | INTRAVENOUS | Status: AC | PRN
  Administered 2015-08-26: 15 mL via INTRAVENOUS

## 2015-08-26 NOTE — ED Notes (Signed)
Patient transported to MRI 

## 2015-08-26 NOTE — ED Provider Notes (Signed)
CSN: [id number]     Arrival date & time 08/26/15  1347 History   First MD Initiated Contact with Patient 08/26/15 1630     Chief Complaint  Patient presents with  . Chest Pain  . Foot Pain     (Consider location/radiation/quality/duration/timing/severity/associated sxs/prior Treatment) HPI 44 room now presenting with multiple complaints. He reports onset of chest pain radiating around his left side his back that began this morning. Pain is sharp worse with deep inspiration and reproduced with movement of the left arm as well as with palpation. He endorses shortness of breath "because it hurts when I take a deep breath." Denies wheezing cough or fevers. No history of coronary artery disease. No history of CHF. Additionally patient reports fatigue over the last day with "pain in all my joints." No joint redness or swelling. No prior history of arthritis. No history of gout. He endorses feeling of "wheeziness" in the left side of his face with some numbness to the left lower face that he woke up with this morning. Additionally endorses some mild blurry vision in the left eye. Patient has had a TIA in the past and had a full workup here last year including MRI brain with no acute findings. He is on 325 mg of aspirin daily and was supposed to follow-up with a neurologist but did not go to his appointment. No sensory disturbances to the remainder of the body and he denies weakness. Over the last couple of weeks he has noticed lack of sensation to his right great toe. He had a recent surgery for amputation of his left great toe 3 weeks ago secondary to diabetes. His pain is well controlled with his home Percocet. Past Medical History  Diagnosis Date  . Hypertension     a. 08/2014 Admitted with hypertensive urgency.  . Chest pain     a. 2015 Reportedly normal stress test in [place].  Marland Kitchen TIA (transient ischemic attack) 08/2014; 03/2015    a. 08/2014 in setting of hypertensive urgency.  . Anemia   . Refusal of  blood transfusions as patient is Jehovah's Witness   . Hyperlipidemia   . Chronic diastolic CHF (congestive heart failure) (HCC)     a.03/2015 Echo: EF 55-60%, Gr 1 DD, mild MR, triv PR.  . Type II diabetes mellitus ([place])   . Sleep apnea   . GERD (gastroesophageal reflux disease)   . Anxiety   . Depression   . Bipolar disorder [facility])    Past Surgical History  Procedure Laterality Date  . Inguinal hernia repair Bilateral ~ 1983- ~ 1986  . Tonsillectomy  ~ 1985  . Circumcision    . Amputation Left 08/03/2015    Procedure: AMPUTATION LEFT GREAT TOE;  Surgeon: Newt Minion, MD;  Location: [place];  Service: Orthopedics;  Laterality: Left;   Family History  Problem Relation Age of Onset  . Hypertension Mother   . Diabetes Mother   . Hyperlipidemia Mother   . Heart disease Mother     s/p pacemaker  . Diabetes Father   . Hypertension Father   . Stroke Father   . Heart attack Father     first MI @ 85.  . Stroke Brother    Social History  Substance Use Topics  . Smoking status: Never Smoker   . Smokeless tobacco: Never Used  . Alcohol Use: No    Review of Systems  Constitutional: Positive for fatigue. Negative for fever, chills, activity change and appetite change.  HENT:  Negative for congestion, facial swelling, rhinorrhea and sore throat.   Eyes: Negative for visual disturbance.  Respiratory: Positive for shortness of breath ("because it hurts when I take a deep breath"). Negative for cough and wheezing.   Cardiovascular: Positive for chest pain. Negative for palpitations and leg swelling.  Gastrointestinal: Positive for nausea. Negative for vomiting, abdominal pain, diarrhea, constipation and blood in stool.  Genitourinary: Negative for dysuria, frequency, hematuria, flank pain and difficulty urinating.  Musculoskeletal: Negative for myalgias, back pain, joint swelling, arthralgias, neck pain and neck stiffness.  Skin: Negative for rash.  Neurological: Positive for  light-headedness and numbness (R great toe, decreased sensation to L chin). Negative for dizziness, facial asymmetry, weakness and headaches.  Psychiatric/Behavioral: Negative for behavioral problems, confusion and agitation.      Allergies  Eggs or egg-derived products; Other; and Milk-related compounds  Home Medications   Prior to Admission medications   Medication Sig Start Date End Date Taking? Authorizing Provider  atorvastatin (LIPITOR) 80 MG tablet Take 1 tablet (80 mg total) by mouth every morning. 08/18/15   Josalyn Funches, MD  carvedilol (COREG) 12.5 MG tablet TAKE 1 TABLET BY MOUTH 2 TIMES DAILY WITH A MEAL 05/17/15   Josalyn Funches, MD  divalproex (DEPAKOTE) 500 MG DR tablet Take 1 tablet (500 mg total) by mouth at bedtime. 08/18/15   Josalyn Funches, MD  ferrous sulfate 325 (65 FE) MG tablet Take 1 tablet (325 mg total) by mouth 2 (two) times daily with a meal. 08/18/15   Josalyn Funches, MD  glipiZIDE (GLUCOTROL) 10 MG tablet Take 1 tablet (10 mg total) by mouth 2 (two) times daily before a meal. 08/18/15   Josalyn Funches, MD  ibuprofen (ADVIL,MOTRIN) 200 MG tablet Take 2 tablets (400 mg total) by mouth every 6 (six) hours as needed for headache (pain). 08/18/15   Josalyn Funches, MD  lisinopril (PRINIVIL,ZESTRIL) 40 MG tablet Take 1 tablet (40 mg total) by mouth daily. 08/18/15   Boykin Nearing, MD  metFORMIN (GLUCOPHAGE) 1000 MG tablet Take 1 tablet (1,000 mg total) by mouth 2 (two) times daily with a meal. 08/18/15   Josalyn Funches, MD  omeprazole (PRILOSEC) 20 MG capsule Take 1 capsule (20 mg total) by mouth daily. 08/18/15   Josalyn Funches, MD  oxyCODONE-acetaminophen (PERCOCET/ROXICET) 5-325 MG tablet Take 1-2 tablets by mouth every 6 (six) hours as needed for severe pain. 08/04/15   Eugenie Filler, MD   BP 125/80 mmHg  Pulse 82  Temp(Src) 98 F (36.7 C)  Resp 18  Ht 5\' 8"  (1.727 m)  Wt 75.751 kg  BMI 25.40 kg/m2  SpO2 100% Physical Exam  Constitutional: He is oriented  to person, place, and time. He appears well-developed and well-nourished. No distress.  HENT:  Head: Normocephalic and atraumatic.  Right Ear: External ear normal.  Left Ear: External ear normal.  Nose: Nose normal.  Mouth/Throat: Oropharynx is clear and moist. No oropharyngeal exudate.  Eyes: Conjunctivae are normal. Pupils are equal, round, and reactive to light. Right eye exhibits no discharge. Left eye exhibits no discharge. No scleral icterus.  Neck: Normal range of motion. Neck supple. No tracheal deviation present.  Cardiovascular: Normal rate and regular rhythm.  Exam reveals no gallop and no friction rub.   No murmur heard. Pulmonary/Chest: Effort normal and breath sounds normal. No respiratory distress. He has no wheezes. He has no rales.  Abdominal: Soft. Bowel sounds are normal. He exhibits no distension and no mass. There is no tenderness. There is no  rebound and no guarding.  Musculoskeletal: Normal range of motion. He exhibits no edema or tenderness.  Neurological: He is alert and oriented to person, place, and time. No cranial nerve deficit. Coordination normal.  Face symmetric, tongue midline. 5/5 strength in the proximal and distal upper and lower extremities bilaterally, with intact sensation to light touch. Normal finger to nose, heel to shin, and rapid alternating movements. Normal speech. Normal gait. Decreased sensation to the left lower face. Pt reports blurry vision in the L eye but has 20/20 vision bilaterally. Lack of sensation in the R great toe, with normal ROM.    Skin: Skin is warm and dry. No rash noted. He is not diaphoretic.  Psychiatric: He has a normal mood and affect. His behavior is normal. Judgment and thought content normal.    ED Course  Procedures (including critical care time) Labs Review Labs Reviewed  CBC - Abnormal; Notable for the following:    RBC 3.44 (*)    Hemoglobin 10.2 (*)    HCT 32.7 (*)    All other components within normal limits   BASIC METABOLIC PANEL  I-STAT TROPOININ, ED    Imaging Review Dg Chest 2 View  08/26/2015  CLINICAL DATA:  45 year old male with a history of chills and chest pain EXAM: CHEST - 2 VIEW COMPARISON:  04/24/2015 FINDINGS: Cardiomediastinal silhouette projects within normal limits in size and contour. No confluent airspace disease, pneumothorax, or pleural effusion. No displaced fracture. Unremarkable appearance of the upper abdomen. IMPRESSION: No radiographic evidence of acute cardiopulmonary disease. Signed, Dulcy Fanny. Earleen Newport, DO [facility] Electronically Signed   By: Corrie Mckusick D.O.   On: 08/26/2015 14:50   I have personally reviewed and evaluated these images and lab results as part of my medical decision-making.   EKG Interpretation None      MDM   Final diagnoses:  None   On arrival the patient is afebrile and hemodynamically stable. Generally well-appearing. Lungs CTAP. Chest x-ray with no indication of pneumonia. As chest pain is pleuritic worse with movement of the left arm and completely reproducible with palpation I suspect a musculoskeletal cause of his pain such as costochondritis. Doubt PE as he has SPO2 of 100% on room air and pain is reproducible as mentioned above. EKG with no ischemic findings. First troponin undetectable but will obtain delta troponin to rule out coronary artery disease. Doubt aortic dissection given description of the pain.  Given sensory disturbance it to the bottom of the face is him present since this morning we'll obtain CT head for further evaluation. Patient is outside of the window for TPA.  I suspect that the lack of sensation in his right great toe is secondary to diabetic neuropathy. He has full range of motion.  Labs including CBC and BMP unremarkable. Doubt anemia or electrolyte disturbances as the cause of his fatigue.  Plan is to reassess after CT head as well as delta  troponin.  10:47 PM On reassessment the patient reports that his left-sided facial numbness had resolved. MRI performed that showed no acute intracranial abnormalities. Doubt CVA. Suspect the patient's symptoms are likely secondary to a TIA which she has had in the past with similar symptoms. I recommend follow up with his PCP early next week for further workup. Patient should continue to take his daily 325 mg of aspirin in the meantime.  As mentioned above chest pain is is likely musculoskeletal as it is reproducible with palpation or movement.  Troponins 2 are 0. EKG with no ischemic changes. Patient is stable for discharge home with PCP follow-up.    Zipporah Plants, MD 08/26/15 2251  Forde Dandy, MD 08/27/15 [phone]

## 2015-08-26 NOTE — ED Notes (Addendum)
Pt c/o Woke up today with left side of face feeling "lazy", and blurred vision. Pt reports then experienced pain from back to center chest pain onset about 10 am. Pt also c/o fatigue and shortness of breath onset yesterday. Pt has surgery to left toe 3 weeks ago, c/o pain to bottom of foot.

## 2015-08-29 ENCOUNTER — Emergency Department (HOSPITAL_COMMUNITY)
Admission: EM | Admit: 2015-08-29 | Discharge: 2015-08-29 | Disposition: A | Discharge status: Home / Self Care | Payer: Medicaid Other | Attending: Emergency Medicine | Admitting: Emergency Medicine

## 2015-08-29 ENCOUNTER — Encounter (HOSPITAL_COMMUNITY): Payer: Self-pay

## 2015-08-29 DIAGNOSIS — I509 Heart failure, unspecified: Secondary | ICD-10-CM | POA: Insufficient documentation

## 2015-08-29 DIAGNOSIS — M7031 Other bursitis of elbow, right elbow: Secondary | ICD-10-CM | POA: Insufficient documentation

## 2015-08-29 DIAGNOSIS — I10 Essential (primary) hypertension: Secondary | ICD-10-CM | POA: Diagnosis not present

## 2015-08-29 DIAGNOSIS — Z79899 Other long term (current) drug therapy: Secondary | ICD-10-CM | POA: Insufficient documentation

## 2015-08-29 DIAGNOSIS — D649 Anemia, unspecified: Secondary | ICD-10-CM | POA: Diagnosis not present

## 2015-08-29 DIAGNOSIS — Z8673 Personal history of transient ischemic attack (TIA), and cerebral infarction without residual deficits: Secondary | ICD-10-CM | POA: Insufficient documentation

## 2015-08-29 DIAGNOSIS — K219 Gastro-esophageal reflux disease without esophagitis: Secondary | ICD-10-CM | POA: Diagnosis not present

## 2015-08-29 DIAGNOSIS — Y9389 Activity, other specified: Secondary | ICD-10-CM | POA: Diagnosis not present

## 2015-08-29 DIAGNOSIS — E785 Hyperlipidemia, unspecified: Secondary | ICD-10-CM | POA: Diagnosis not present

## 2015-08-29 DIAGNOSIS — F319 Bipolar disorder, unspecified: Secondary | ICD-10-CM | POA: Diagnosis not present

## 2015-08-29 DIAGNOSIS — E119 Type 2 diabetes mellitus without complications: Secondary | ICD-10-CM | POA: Insufficient documentation

## 2015-08-29 DIAGNOSIS — Z8669 Personal history of other diseases of the nervous system and sense organs: Secondary | ICD-10-CM | POA: Diagnosis not present

## 2015-08-29 DIAGNOSIS — M7021 Olecranon bursitis, right elbow: Secondary | ICD-10-CM

## 2015-08-29 DIAGNOSIS — F419 Anxiety disorder, unspecified: Secondary | ICD-10-CM | POA: Insufficient documentation

## 2015-08-29 DIAGNOSIS — Z7984 Long term (current) use of oral hypoglycemic drugs: Secondary | ICD-10-CM | POA: Insufficient documentation

## 2015-08-29 DIAGNOSIS — M7989 Other specified soft tissue disorders: Secondary | ICD-10-CM | POA: Diagnosis present

## 2015-08-29 MED ORDER — IBUPROFEN 400 MG PO TABS
600.0000 mg | ORAL_TABLET | Freq: Once | ORAL | Status: AC
  Administered 2015-08-29: 600 mg via ORAL
  Filled 2015-08-29: qty 1

## 2015-08-29 MED ORDER — IBUPROFEN 600 MG PO TABS: 600.0000 mg | ORAL_TABLET | Freq: Three times a day (TID) | ORAL | Status: DC | PRN

## 2015-08-29 NOTE — ED Notes (Signed)
EDP at the bedside.  ?

## 2015-08-29 NOTE — ED Provider Notes (Signed)
CSN: [id number]     Arrival date & time 08/29/15  [id number] History   First MD Initiated Contact with Patient 08/29/15 1150     Chief Complaint  Patient presents with  . elbow swelling      (Consider location/radiation/quality/duration/timing/severity/associated sxs/prior Treatment) HPI 45 year old male presents with right elbow swelling since he woke up this morning. Patient states he does not room any trauma or excessive use of his elbow. Has not been doing significant work that would cause his elbow to be underground a lot. Patient states there is some burning at the site of swelling but not significant pain. Denies weakness or numbness. No trouble bending his elbow. No fevers. Has not taken anything for the pain.  Past Medical History  Diagnosis Date  . Hypertension     a. 08/2014 Admitted with hypertensive urgency.  . Chest pain     a. 2015 Reportedly normal stress test in [place].  Marland Kitchen TIA (transient ischemic attack) 08/2014; 03/2015    a. 08/2014 in setting of hypertensive urgency.  . Anemia   . Refusal of blood transfusions as patient is Jehovah's Witness   . Hyperlipidemia   . Chronic diastolic CHF (congestive heart failure) (HCC)     a.03/2015 Echo: EF 55-60%, Gr 1 DD, mild MR, triv PR.  . Type II diabetes mellitus ([place])   . Sleep apnea   . GERD (gastroesophageal reflux disease)   . Anxiety   . Depression   . Bipolar disorder [facility]    Past Surgical History  Procedure Laterality Date  . Inguinal hernia repair Bilateral ~ 1983- ~ 1986  . Tonsillectomy  ~ 1985  . Circumcision    . Amputation Left 08/03/2015    Procedure: AMPUTATION LEFT GREAT TOE;  Surgeon: Newt Minion, MD;  Location: [place];  Service: Orthopedics;  Laterality: Left;   Family History  Problem Relation Age of Onset  . Hypertension Mother   . Diabetes Mother   . Hyperlipidemia Mother   . Heart disease Mother     s/p pacemaker  . Diabetes Father   . Hypertension Father   . Stroke Father   . Heart attack Father      first MI @ 21.  . Stroke Brother    Social History  Substance Use Topics  . Smoking status: Never Smoker   . Smokeless tobacco: Never Used  . Alcohol Use: No    Review of Systems  Musculoskeletal: Positive for joint swelling. Negative for arthralgias.  Skin: Negative for color change, rash and wound.  Neurological: Negative for weakness and numbness.  All other systems reviewed and are negative.     Allergies  Eggs or egg-derived products; Other; and Milk-related compounds  Home Medications   Prior to Admission medications   Medication Sig Start Date End Date Taking? Authorizing Provider  atorvastatin (LIPITOR) 80 MG tablet Take 1 tablet (80 mg total) by mouth every morning. 08/18/15   Josalyn Funches, MD  carvedilol (COREG) 12.5 MG tablet TAKE 1 TABLET BY MOUTH 2 TIMES DAILY WITH A MEAL 05/17/15   Josalyn Funches, MD  divalproex (DEPAKOTE) 500 MG DR tablet Take 1 tablet (500 mg total) by mouth at bedtime. 08/18/15   Josalyn Funches, MD  ferrous sulfate 325 (65 FE) MG tablet Take 1 tablet (325 mg total) by mouth 2 (two) times daily with a meal. 08/18/15   Josalyn Funches, MD  glipiZIDE (GLUCOTROL) 10 MG tablet Take 1 tablet (10 mg total) by mouth 2 (two) times daily before  a meal. 08/18/15   Boykin Nearing, MD  ibuprofen (ADVIL,MOTRIN) 600 MG tablet Take 1 tablet (600 mg total) by mouth every 8 (eight) hours as needed. 08/29/15   Sherwood Gambler, MD  lisinopril (PRINIVIL,ZESTRIL) 40 MG tablet Take 1 tablet (40 mg total) by mouth daily. 08/18/15   Boykin Nearing, MD  metFORMIN (GLUCOPHAGE) 1000 MG tablet Take 1 tablet (1,000 mg total) by mouth 2 (two) times daily with a meal. 08/18/15   Josalyn Funches, MD  omeprazole (PRILOSEC) 20 MG capsule Take 1 capsule (20 mg total) by mouth daily. 08/18/15   Josalyn Funches, MD  oxyCODONE-acetaminophen (PERCOCET/ROXICET) 5-325 MG tablet Take 1-2 tablets by mouth every 6 (six) hours as needed for severe pain. Patient not taking: Reported on 08/26/2015  08/04/15   Eugenie Filler, MD   BP 110/73 mmHg  Pulse 94  Temp(Src) 98.2 F (36.8 C) (Oral)  Resp 18  SpO2 100% Physical Exam  Constitutional: He is oriented to person, place, and time. He appears well-developed and well-nourished.  HENT:  Head: Normocephalic and atraumatic.  Right Ear: External ear normal.  Left Ear: External ear normal.  Nose: Nose normal.  Cardiovascular: Normal rate, regular rhythm and intact distal pulses.   Pulses:      Radial pulses are 2+ on the right side.  Pulmonary/Chest: Effort normal.  Abdominal: Soft. He exhibits no distension.  Musculoskeletal:       Right elbow: He exhibits swelling (boggy swelling c/w bursitis - no redness, warmth or tenderness). He exhibits normal range of motion (full active and passive ROM) and no effusion. No tenderness found.       Right upper arm: He exhibits no tenderness.       Right forearm: He exhibits no tenderness.  Neurological: He is alert and oriented to person, place, and time.  Skin: Skin is warm and dry. No erythema.  Nursing note and vitals reviewed.   ED Course  Procedures (including critical care time) Labs Review Labs Reviewed - No data to display  Imaging Review No results found. I have personally reviewed and evaluated these images and lab results as part of my medical decision-making.   EKG Interpretation None      MDM   Final diagnoses:  Bursitis of elbow, right    Patient's exam is consistent with uncomplicated elbow bursitis. Not c/w abscess. No apparent joint effusion and has full range of motion. Does not appear to be infected. No indication for x-ray imaging given no injury, this seems very consistent with bursitis and I highly doubt a joint effusion. Plan to treat conservatively with ibuprofen, ice, and follow-up with PCP. Discussed strict return precautions.    Sherwood Gambler, MD 08/29/15 1230

## 2015-08-29 NOTE — ED Notes (Signed)
Patient here with right elbow swelling x 1 day, denies trauma

## 2015-09-01 ENCOUNTER — Ambulatory Visit (INDEPENDENT_AMBULATORY_CARE_PROVIDER_SITE_OTHER): Payer: Medicaid Other

## 2015-09-01 ENCOUNTER — Encounter: Payer: Self-pay | Admitting: Podiatry

## 2015-09-01 ENCOUNTER — Ambulatory Visit (INDEPENDENT_AMBULATORY_CARE_PROVIDER_SITE_OTHER): Payer: Medicaid Other | Admitting: Podiatry

## 2015-09-01 VITALS — BP 130/83 | HR 92 | Resp 12

## 2015-09-01 DIAGNOSIS — M79671 Pain in right foot: Secondary | ICD-10-CM

## 2015-09-01 DIAGNOSIS — E1140 Type 2 diabetes mellitus with diabetic neuropathy, unspecified: Secondary | ICD-10-CM

## 2015-09-01 DIAGNOSIS — E1149 Type 2 diabetes mellitus with other diabetic neurological complication: Secondary | ICD-10-CM

## 2015-09-01 DIAGNOSIS — Q828 Other specified congenital malformations of skin: Secondary | ICD-10-CM

## 2015-09-01 NOTE — Progress Notes (Signed)
   Subjective:    Patient ID: Shane [name], male    DOB: December 09, 1970, 45 y.o.   MRN: [id number]  HPI   PT IS DIABETIC/AMPUTATED LT GREAT TOE DONE BY DR. DUDA  AND CONCERNING ABOUT RT FOOT GREAT TOE/JOINT DOES NOT HAVE ANY SENSATION FOR ABOUT 1 WEEK.  Review of Systems  Constitutional: Positive for fatigue.  Musculoskeletal: Positive for gait problem.  Hematological: Bruises/bleeds easily.       Objective:   Physical Exam        Assessment & Plan:

## 2015-09-01 NOTE — Progress Notes (Signed)
Subjective:     Patient ID: Shane [name], male   DOB: May 19, 1971, 45 y.o.   MRN: [id number]  HPI patient states I've had a 17 year history of diabetes with poor control for a long time creating nerve pain and loss of sensation. One month ago had amputation of my left big toe and now I'm starting to develop diminished feeling in my right big toe and I have in general numbness of my right and left foot   Review of Systems  All other systems reviewed and are negative.      Objective:   Physical Exam  Constitutional: He is oriented to person, place, and time.  Cardiovascular: Intact distal pulses.   Neurological: He is oriented to person, place, and time.  Skin: Skin is warm and dry.  Nursing note and vitals reviewed.  neurological status profoundly diminished with diminishment of sharp tall vibratory and DTR reflexes. Patient's found to have circulatory status intact with good PT pulses and mild diminishment of DP pulses with patient having keratotic lesion medial side right big toe. Patient's had amputation of the left big toe with dressing still on his foot and is wearing surgical shoe with a nonweightbearing status. He does feel like there is been a fundamental change in his left big toe and he wanted to get it checked. He does have perfusion adequate with 3 second cap fill time right and no other pathology     Assessment:     Profound neuropathy with at risk diabetic with history of amputation left with keratotic lesion right that could be pre-ulcerative in it's appearance with no drainage or redness currently    Plan:     H&P and x-ray of right foot reviewed with patient. Today using sharp sterile his mentation I debrided the lesion with no iatrogenic bleeding and applied cushioning and gave him strict instructions on daily inspections of his foot and if any redness any changes or drainage were to occur to consider this a medical emergency and either see [place] that day or go to the  emergency room. Reappoint for routine care  X-ray report right indicated no indications of ostial lysis or any indications of calcifications of artery

## 2015-09-04 ENCOUNTER — Telehealth: Payer: Self-pay

## 2015-09-04 ENCOUNTER — Ambulatory Visit: Payer: Medicaid Other | Attending: Internal Medicine | Admitting: Internal Medicine

## 2015-09-04 VITALS — BP 165/100 | HR 96 | Temp 97.7°F | Resp 18 | Ht 68.0 in | Wt 176.0 lb

## 2015-09-04 DIAGNOSIS — G459 Transient cerebral ischemic attack, unspecified: Secondary | ICD-10-CM

## 2015-09-04 DIAGNOSIS — F411 Generalized anxiety disorder: Secondary | ICD-10-CM | POA: Diagnosis not present

## 2015-09-04 DIAGNOSIS — G4730 Sleep apnea, unspecified: Secondary | ICD-10-CM | POA: Insufficient documentation

## 2015-09-04 DIAGNOSIS — I10 Essential (primary) hypertension: Secondary | ICD-10-CM | POA: Insufficient documentation

## 2015-09-04 DIAGNOSIS — E0842 Diabetes mellitus due to underlying condition with diabetic polyneuropathy: Secondary | ICD-10-CM

## 2015-09-04 DIAGNOSIS — Z7982 Long term (current) use of aspirin: Secondary | ICD-10-CM | POA: Insufficient documentation

## 2015-09-04 DIAGNOSIS — K219 Gastro-esophageal reflux disease without esophagitis: Secondary | ICD-10-CM | POA: Insufficient documentation

## 2015-09-04 DIAGNOSIS — Z7984 Long term (current) use of oral hypoglycemic drugs: Secondary | ICD-10-CM | POA: Insufficient documentation

## 2015-09-04 DIAGNOSIS — I5032 Chronic diastolic (congestive) heart failure: Secondary | ICD-10-CM | POA: Insufficient documentation

## 2015-09-04 DIAGNOSIS — Z888 Allergy status to other drugs, medicaments and biological substances status: Secondary | ICD-10-CM | POA: Insufficient documentation

## 2015-09-04 DIAGNOSIS — Z8673 Personal history of transient ischemic attack (TIA), and cerebral infarction without residual deficits: Secondary | ICD-10-CM | POA: Diagnosis not present

## 2015-09-04 DIAGNOSIS — E1142 Type 2 diabetes mellitus with diabetic polyneuropathy: Secondary | ICD-10-CM | POA: Insufficient documentation

## 2015-09-04 DIAGNOSIS — E1149 Type 2 diabetes mellitus with other diabetic neurological complication: Secondary | ICD-10-CM

## 2015-09-04 DIAGNOSIS — Z531 Procedure and treatment not carried out because of patient's decision for reasons of belief and group pressure: Secondary | ICD-10-CM | POA: Insufficient documentation

## 2015-09-04 DIAGNOSIS — E785 Hyperlipidemia, unspecified: Secondary | ICD-10-CM | POA: Diagnosis not present

## 2015-09-04 DIAGNOSIS — Z79899 Other long term (current) drug therapy: Secondary | ICD-10-CM | POA: Insufficient documentation

## 2015-09-04 DIAGNOSIS — E119 Type 2 diabetes mellitus without complications: Secondary | ICD-10-CM | POA: Insufficient documentation

## 2015-09-04 DIAGNOSIS — F319 Bipolar disorder, unspecified: Secondary | ICD-10-CM | POA: Insufficient documentation

## 2015-09-04 LAB — GLUCOSE, POCT (MANUAL RESULT ENTRY): POC Glucose: 139 mg/dl — AB (ref 70–99)

## 2015-09-04 MED ORDER — ASPIRIN EC 325 MG PO TBEC: 325.0000 mg | DELAYED_RELEASE_TABLET | Freq: Every day | ORAL | Status: DC

## 2015-09-04 MED ORDER — GABAPENTIN 100 MG PO CAPS: 100.0000 mg | ORAL_CAPSULE | Freq: Three times a day (TID) | ORAL | Status: DC

## 2015-09-04 MED ORDER — AMLODIPINE BESYLATE 5 MG PO TABS: 5.0000 mg | ORAL_TABLET | Freq: Every day | ORAL | Status: DC

## 2015-09-04 NOTE — Progress Notes (Signed)
Delete note. Entered in error.

## 2015-09-04 NOTE — Progress Notes (Signed)
Patient's here for hospital f/up for TIA.   Patient states he feels better today. But he's experienced some sweating and some chest discomfort Saturday night. Pain has since dissipated.  Rated pain 8/10, described as stabbing pain.   Patient requesting refills on Lipitor and omperazole.  Patient reports taking all medications this morning.  Per Dr. Janne Napoleon, patient will not be given clonidine.

## 2015-09-04 NOTE — Patient Instructions (Signed)
- [facility] 2 wks/ htn - pick up meds - fu w/ neurology  Panic Attacks Panic attacks are sudden, short-livedsurges of severe anxiety, fear, or discomfort. They may occur for no reason when you are relaxed, when you are anxious, or when you are sleeping. Panic attacks may occur for a number of reasons:   Healthy people occasionally have panic attacks in extreme, life-threatening situations, such as war or natural disasters. Normal anxiety is a protective mechanism of the body that helps [place] react to danger (fight or flight response).  Panic attacks are often seen with anxiety disorders, such as panic disorder, social anxiety disorder, generalized anxiety disorder, and phobias. Anxiety disorders cause excessive or uncontrollable anxiety. They may interfere with your relationships or other life activities.  Panic attacks are sometimes seen with other mental illnesses, such as depression and posttraumatic stress disorder.  Certain medical conditions, prescription medicines, and drugs of abuse can cause panic attacks. SYMPTOMS  Panic attacks start suddenly, peak within 20 minutes, and are accompanied by four or more of the following symptoms:  Pounding heart or fast heart rate (palpitations).  Sweating.  Trembling or shaking.  Shortness of breath or feeling smothered.  Feeling choked.  Chest pain or discomfort.  Nausea or strange feeling in your stomach.  Dizziness, light-headedness, or feeling like you will faint.  Chills or hot flushes.  Numbness or tingling in your lips or hands and feet.  Feeling that things are not real or feeling that you are not yourself.  Fear of losing control or going crazy.  Fear of dying. Some of these symptoms can mimic serious medical conditions. For example, you may think you are having a heart attack. Although panic attacks can be very scary, they are not life threatening. DIAGNOSIS  Panic attacks are diagnosed through an assessment  by your health care provider. Your health care provider will ask questions about your symptoms, such as where and when they occurred. Your health care provider will also ask about your medical history and use of alcohol and drugs, including prescription medicines. Your health care provider may order blood tests or other studies to rule out a serious medical condition. Your health care provider may refer you to a mental health professional for further evaluation. TREATMENT   Most healthy people who have one or two panic attacks in an extreme, life-threatening situation will not require treatment.  The treatment for panic attacks associated with anxiety disorders or other mental illness typically involves counseling with a mental health professional, medicine, or a combination of both. Your health care provider will help determine what treatment is best for you.  Panic attacks due to physical illness usually go away with treatment of the illness. If prescription medicine is causing panic attacks, talk with your health care provider about stopping the medicine, decreasing the dose, or substituting another medicine.  Panic attacks due to alcohol or drug abuse go away with abstinence. Some adults need professional help in order to stop drinking or using drugs. HOME CARE INSTRUCTIONS   Take all medicines as directed by your health care provider.   Schedule and attend follow-up visits as directed by your health care provider. It is important to keep all your appointments. SEEK MEDICAL CARE IF:  You are not able to take your medicines as prescribed.  Your symptoms do not improve or get worse. SEEK IMMEDIATE MEDICAL CARE IF:   You experience panic attack symptoms that are different than your usual symptoms.  You  have serious thoughts about hurting yourself or others.  You are taking medicine for panic attacks and have a serious side effect. MAKE SURE YOU:  Understand these instructions.  Will  watch your condition.  Will get help right away if you are not doing well or get worse.   This information is not intended to replace advice given to you by your health care provider. Make sure you discuss any questions you have with your health care provider.   Document Released: 06/03/2005 Document Revised: 06/08/2013 Document Reviewed: 01/15/2013 Elsevier Interactive Patient Education [employer].

## 2015-09-04 NOTE — Telephone Encounter (Signed)
CMA called patient, patient verified name and DOB. Patient was contacted about lab rework. Patient states he's having the BMP w/ fasting on his 44mo f/up along with f/up visit. Patient misunderstood that he's to be fasting with cholesterol on next visit and BMP is to be done on today's OV . Patient verbalized he understood with no further questions. Patient will return for lab visit. Patient was transferred to the front to make a lab visit only for tomorrow, 09/05/15.

## 2015-09-04 NOTE — Progress Notes (Addendum)
Shane [name], is a 45 y.o. male  [id number]  [id number]  DOB - 22-Jun-1970  CC:  Chief Complaint  Patient presents with  . Hospitalization Follow-up  . Transient Ischemic Attack       HPI: Shane [name] is a 45 y.o. male here today to establish medical care and hospital f/u for TIA 08/26/15.  No complaints currently.     Patient has No headache, No chest pain, No abdominal pain - No Nausea, No new weakness tingling or numbness, No Cough - SOB.  Allergies  Allergen Reactions  . Eggs Or Egg-Derived Products Diarrhea  . Other Other (See Comments)    Red meat causes stomach pains, bloating and diarrhea  . Milk-Related Compounds Diarrhea and Other (See Comments)    Any dairy products  - diarrhea and bloating   Past Medical History  Diagnosis Date  . Hypertension     a. 08/2014 Admitted with hypertensive urgency.  . Chest pain     a. 2015 Reportedly normal stress test in [place].  Marland Kitchen TIA (transient ischemic attack) 08/2014; 03/2015    a. 08/2014 in setting of hypertensive urgency.  . Anemia   . Refusal of blood transfusions as patient is Jehovah's Witness   . Hyperlipidemia   . Chronic diastolic CHF (congestive heart failure) (HCC)     a.03/2015 Echo: EF 55-60%, Gr 1 DD, mild MR, triv PR.  . Type II diabetes mellitus ([facility])   . Sleep apnea   . GERD (gastroesophageal reflux disease)   . Anxiety   . Depression   . Bipolar disorder [facility])    Current Outpatient Prescriptions on File Prior to Visit  Medication Sig Dispense Refill  . atorvastatin (LIPITOR) 80 MG tablet Take 1 tablet (80 mg total) by mouth every morning. 30 tablet 5  . carvedilol (COREG) 12.5 MG tablet TAKE 1 TABLET BY MOUTH 2 TIMES DAILY WITH A MEAL 60 tablet 3  . divalproex (DEPAKOTE) 500 MG DR tablet Take 1 tablet (500 mg total) by mouth at bedtime. 30 tablet 5  . ferrous sulfate 325 (65 FE) MG tablet Take 1 tablet (325 mg total) by mouth 2 (two) times daily with a meal. 60 tablet 5  . glipiZIDE (GLUCOTROL)  10 MG tablet Take 1 tablet (10 mg total) by mouth 2 (two) times daily before a meal. 60 tablet 5  . ibuprofen (ADVIL,MOTRIN) 600 MG tablet Take 1 tablet (600 mg total) by mouth every 8 (eight) hours as needed. 30 tablet 0  . lisinopril (PRINIVIL,ZESTRIL) 40 MG tablet Take 1 tablet (40 mg total) by mouth daily. 30 tablet 5  . metFORMIN (GLUCOPHAGE) 1000 MG tablet Take 1 tablet (1,000 mg total) by mouth 2 (two) times daily with a meal. 60 tablet 5  . omeprazole (PRILOSEC) 20 MG capsule Take 1 capsule (20 mg total) by mouth daily. 30 capsule 5   No current facility-administered medications on file prior to visit.   Family History  Problem Relation Age of Onset  . Hypertension Mother   . Diabetes Mother   . Hyperlipidemia Mother   . Heart disease Mother     s/p pacemaker  . Diabetes Father   . Hypertension Father   . Stroke Father   . Heart attack Father     first MI @ 63.  . Stroke Brother    Social History   Social History  . Marital Status: Married    Spouse Name: N/A  . Number of Children: N/A  . Years of Education:  N/A   Occupational History  . Not on file.   Social History Main Topics  . Smoking status: Never Smoker   . Smokeless tobacco: Never Used  . Alcohol Use: No  . Drug Use: No  . Sexual Activity: No   Other Topics Concern  . Not on file   Social History Narrative   Lives in [place] with wife.  Active but doesn't routinely exercise.    Review of Systems: Constitutional: Negative for fever, chills, diaphoresis, activity change, appetite change and fatigue. HENT: Negative for ear pain, nosebleeds, congestion, facial swelling, rhinorrhea, neck pain, neck stiffness and ear discharge.  Eyes: Negative for pain, discharge, redness, itching and visual disturbance. Respiratory: Negative for cough, choking, chest tightness, shortness of breath, wheezing and stridor.  Cardiovascular: Negative for chest pain, palpitations and leg swelling. Gastrointestinal: Negative for  abdominal distention. Genitourinary: Negative for dysuria, urgency, frequency, hematuria, flank pain, decreased urine volume, difficulty urinating and dyspareunia.  Musculoskeletal: Negative for back pain, joint swelling, arthralgia and gait problem. Neurological: Negative for dizziness, tremors, seizures, syncope, facial asymmetry, speech difficulty, weakness, light-headedness, numbness and headaches.  Hematological: Negative for adenopathy. Does not bruise/bleed easily. Psychiatric/Behavioral: Negative for hallucinations, behavioral problems, confusion, dysphoric mood, decreased concentration and agitation.    Objective:   Filed Vitals:   09/04/15 0938  BP: 165/100  Pulse: 96  Temp: 97.7 F (36.5 C)  Resp: 18    Physical Exam: Constitutional: Patient appears well-developed and well-nourished. No distress. Aaox3, pleasant. HENT: Normocephalic, atraumatic, External right and left ear normal. Oropharynx is clear and moist.  Eyes: Conjunctivae and EOM are normal. PERRL, no scleral icterus. Neck: Normal ROM. Neck supple. No JVD. No tracheal deviation. No thyromegaly. CVS: RRR, S1/S2 +, no murmurs, no gallops, no carotid bruit.  Pulmonary: Effort and breath sounds normal, no stridor, rhonchi, wheezes, rales.  Abdominal: Soft. BS +, no distension, tenderness, rebound or guarding.  Musculoskeletal: Normal range of motion. No edema and no tenderness.  Lymphadenopathy: No lymphadenopathy noted, cervical, inguinal or axillary Neuro: Alert. Normal reflexes, muscle tone coordination. No cranial nerve deficit grossly. Skin: Skin is warm and dry. No rash noted. Not diaphoretic. No erythema. No pallor. Psychiatric: Normal mood and affect. Behavior, judgment, thought content normal.  Lab Results  Component Value Date   WBC 5.9 09/08/2015   HGB 9.7* 09/08/2015   HCT 29.9* 09/08/2015   MCV 91.4 09/08/2015   PLT 217 09/08/2015   Lab Results  Component Value Date   CREATININE 0.80 09/08/2015    BUN 15 09/08/2015   NA 142 09/08/2015   K 4.3 09/08/2015   CL 106 09/08/2015   CO2 27 09/08/2015    Lab Results  Component Value Date   HGBA1C 7.1 08/18/2015   Lipid Panel     Component Value Date/Time   CHOL 111* 09/08/2015 0929   TRIG 45 09/08/2015 0929   HDL 41 09/08/2015 0929   CHOLHDL 2.7 09/08/2015 0929   VLDL 9 09/08/2015 0929   LDLCALC 61 09/08/2015 0929       Assessment and plan:   1. 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 Essential hypertension, uncontrolled - lisinopril 40, coreg - amLODipine (NORVASC) 5 MG tablet; Take 1 tablet (5 mg total) by mouth daily.  Dispense: 90 tablet; Refill: 3 - Basic Metabolic Panel  3. Hyperlipidemia - chk lipid panel - continue statins  4. Transient cerebral ischemia, unspecified transient  cerebral ischemia type rcd asa daily, better bp control - aspirin EC 325 MG tablet; Take 1 tablet (325 mg total) by mouth daily.  Dispense: 30 tablet; Refill: 0  5. Anxiety state/anxious - breathing exercises recd, increase physical activity  6. Diabetic polyneuropathy associated with diabetes mellitus due to underlying condition (HCC) trial - gabapentin (NEURONTIN) 100 MG capsule; Take 1 capsule (100 mg total) by mouth 3 (three) times daily.  Dispense: 90 capsule; Refill: 3   Return in about 3 months (around 12/05/2015).  The patient was given clear instructions to go to ER or return to medical center if symptoms don't improve, worsen or new problems develop. The patient verbalized understanding. The patient was told to call to get lab results if they haven't heard anything in the next week.      Maren Reamer, MD, [place], [place]   09/11/2015, 9:35 AM

## 2015-09-06 ENCOUNTER — Other Ambulatory Visit: Payer: Self-pay

## 2015-09-07 MED FILL — GABAPENTIN 100 MG CAPSULE: 100 | 30 days supply | Qty: 90 | Fill #0

## 2015-09-07 MED FILL — ?AMLODIPINE BESYLATE 5 MG T: 5 | 30 days supply | Qty: 30 | Fill #0

## 2015-09-08 ENCOUNTER — Ambulatory Visit: Payer: Managed Care, Other (non HMO) | Attending: Family Medicine

## 2015-09-08 DIAGNOSIS — Z0000 Encounter for general adult medical examination without abnormal findings: Secondary | ICD-10-CM | POA: Insufficient documentation

## 2015-09-08 LAB — CBC WITH DIFFERENTIAL/PLATELET
BASOS PCT: 0 % (ref 0–1)
Basophils Absolute: 0 10*3/uL (ref 0.0–0.1)
EOS ABS: 0.3 10*3/uL (ref 0.0–0.7)
Eosinophils Relative: 5 % (ref 0–5)
HCT: 29.9 % — ABNORMAL LOW (ref 39.0–52.0)
HEMOGLOBIN: 9.7 g/dL — AB (ref 13.0–17.0)
LYMPHS ABS: 1.9 10*3/uL (ref 0.7–4.0)
Lymphocytes Relative: 32 % (ref 12–46)
MCH: 29.7 pg (ref 26.0–34.0)
MCHC: 32.4 g/dL (ref 30.0–36.0)
MCV: 91.4 fL (ref 78.0–100.0)
MONO ABS: 0.3 10*3/uL (ref 0.1–1.0)
MONOS PCT: 5 % (ref 3–12)
MPV: 12.9 fL — ABNORMAL HIGH (ref 8.6–12.4)
NEUTROS ABS: 3.4 10*3/uL (ref 1.7–7.7)
NEUTROS PCT: 58 % (ref 43–77)
Platelets: 217 10*3/uL (ref 150–400)
RBC: 3.27 MIL/uL — ABNORMAL LOW (ref 4.22–5.81)
RDW: 13.9 % (ref 11.5–15.5)
WBC: 5.9 10*3/uL (ref 4.0–10.5)

## 2015-09-08 LAB — BASIC METABOLIC PANEL
BUN: 15 mg/dL (ref 7–25)
CALCIUM: 9.1 mg/dL (ref 8.6–10.3)
CO2: 27 mmol/L (ref 20–31)
Chloride: 106 mmol/L (ref 98–110)
Creat: 0.8 mg/dL (ref 0.60–1.35)
GLUCOSE: 130 mg/dL — AB (ref 65–99)
Potassium: 4.3 mmol/L (ref 3.5–5.3)
SODIUM: 142 mmol/L (ref 135–146)

## 2015-09-08 LAB — LIPID PANEL
CHOLESTEROL: 111 mg/dL — AB (ref 125–200)
HDL: 41 mg/dL (ref >=40)
LDL Cholesterol: 61 mg/dL (ref <130)
Total CHOL/HDL Ratio: 2.7 Ratio (ref <=5.0)
Triglycerides: 45 mg/dL (ref <150)
VLDL: 9 mg/dL (ref <30)

## 2015-09-08 MED FILL — OMEPRAZOLE DR 20 MG CAPSULE: 20 | 30 days supply | Qty: 30 | Fill #1

## 2015-09-08 MED FILL — ATORVASTATIN 80 MG TABLET: 80 | 30 days supply | Qty: 30 | Fill #5

## 2015-09-11 ENCOUNTER — Telehealth: Payer: Self-pay | Admitting: *Deleted

## 2015-09-11 NOTE — Telephone Encounter (Signed)
-----   Message from Maren Reamer, MD sent at 09/11/2015  9:45 AM EDT ----- Please call patient with lab results.  He cholesterol panel looked good. Continue low fat, low salt diet.  His glucose was elevated, and we will continue to adjust his diabetes meds as needed.   He remains anemic.  Please continue iron supplements (otc 325mg  ) 2x day, and we will rechk his iron levels at next visit.  He may need to take stool softerners /high fiber diet when taking the iron pills bc they will make him constipated. thanks

## 2015-09-11 NOTE — Telephone Encounter (Signed)
Spoke to patient,verified name and date of birth gave results and instructions.

## 2015-10-06 MED FILL — OMEPRAZOLE DR 20 MG CAPSULE: 20 | 30 days supply | Qty: 30 | Fill #2

## 2015-10-06 MED FILL — DIVALPROEX SOD DR 500 MG TA: 500 | 30 days supply | Qty: 30 | Fill #1

## 2015-10-06 MED FILL — ?AMLODIPINE BESYLATE 5 MG T: 5 | 30 days supply | Qty: 30 | Fill #1

## 2015-10-06 MED FILL — ATORVASTATIN 80 MG TABLET: 80 | 30 days supply | Qty: 30 | Fill #6

## 2015-10-06 MED FILL — LISINOPRIL 40 MG TABLET: 40 | 30 days supply | Qty: 30 | Fill #1

## 2015-10-06 MED FILL — FERROUS SULFATE 325 MG TAB: 325 (65 FE) | 30 days supply | Qty: 60 | Fill #0

## 2015-10-06 MED FILL — ?METFORMIN HCL 1,000 MG TAB: 1000 | 30 days supply | Qty: 60 | Fill #1

## 2015-10-09 ENCOUNTER — Ambulatory Visit: Payer: Managed Care, Other (non HMO) | Admitting: Neurology

## 2015-10-10 ENCOUNTER — Encounter: Payer: Self-pay | Admitting: Neurology

## 2015-10-31 ENCOUNTER — Telehealth: Payer: Self-pay | Admitting: Family Medicine

## 2015-10-31 NOTE — Telephone Encounter (Signed)
Pt. Called stating that his physiatrist has him taking 2 tablets of divalproex (DEPAKOTE) 500 MG DR tablet, and his Rx is for 1 tablet a day. He called his pharmacy and he has to wait 10 days before he can get a refill. Pt. Would like to speak to his nurse. Please f/u with pt.

## 2015-11-01 NOTE — Telephone Encounter (Signed)
Patient called to follow up on previous note

## 2015-11-03 MED FILL — DIVALPROEX SOD DR 500 MG TA: 500 | 30 days supply | Qty: 30 | Fill #2

## 2015-11-06 NOTE — Telephone Encounter (Signed)
Called patient left VM Requested that he ask his psychiatrist to send an updated Rx to the pharmacy. Advised that when the dose is changed that he request his psychiatrist send an update, preferably a new Rx or at least the OV note to reflect the change.  Called the onsite pharmacy, he picked up the Rx on 11/03/15 but it was still the previous sig of 500 mg at bedtime.   So patient has enough to take 1000 mg daily for 15 days. Will await update from his psychiatrist before changing Rx to verify dose.

## 2015-11-16 MED FILL — glipiZIDE 10 MG TABS: 10 | 30 days supply | Qty: 60 | Fill #1

## 2015-11-16 MED FILL — ?OMEPRAZOLE DR 20 MG CAPSUL: 20 | 30 days supply | Qty: 30 | Fill #3

## 2015-11-16 MED FILL — ATORVASTATIN 80 MG TABLET: 80 | 30 days supply | Qty: 30 | Fill #7

## 2015-11-16 MED FILL — GABAPENTIN 100 MG CAPSULE: 100 | 30 days supply | Qty: 90 | Fill #1

## 2015-11-16 MED FILL — ?AMLODIPINE BESYLATE 5 MG T: 5 | 30 days supply | Qty: 30 | Fill #2

## 2015-11-21 ENCOUNTER — Encounter: Payer: Self-pay | Admitting: Family Medicine

## 2015-11-21 ENCOUNTER — Ambulatory Visit (HOSPITAL_COMMUNITY)
Admission: RE | Admit: 2015-11-21 | Discharge: 2015-11-21 | Disposition: A | Discharge status: Home / Self Care | Payer: Medicaid Other | Source: Ambulatory Visit | Attending: Family Medicine | Admitting: Family Medicine

## 2015-11-21 ENCOUNTER — Ambulatory Visit: Payer: Managed Care, Other (non HMO) | Attending: Family Medicine | Admitting: Family Medicine

## 2015-11-21 VITALS — BP 128/84 | HR 72 | Temp 97.5°F | Resp 16 | Ht 68.0 in | Wt 175.0 lb

## 2015-11-21 DIAGNOSIS — E1149 Type 2 diabetes mellitus with other diabetic neurological complication: Secondary | ICD-10-CM

## 2015-11-21 DIAGNOSIS — M79672 Pain in left foot: Secondary | ICD-10-CM | POA: Insufficient documentation

## 2015-11-21 DIAGNOSIS — G4700 Insomnia, unspecified: Secondary | ICD-10-CM | POA: Diagnosis not present

## 2015-11-21 DIAGNOSIS — M7989 Other specified soft tissue disorders: Secondary | ICD-10-CM | POA: Insufficient documentation

## 2015-11-21 DIAGNOSIS — Z7984 Long term (current) use of oral hypoglycemic drugs: Secondary | ICD-10-CM | POA: Insufficient documentation

## 2015-11-21 DIAGNOSIS — Z79899 Other long term (current) drug therapy: Secondary | ICD-10-CM | POA: Insufficient documentation

## 2015-11-21 DIAGNOSIS — E119 Type 2 diabetes mellitus without complications: Secondary | ICD-10-CM | POA: Insufficient documentation

## 2015-11-21 DIAGNOSIS — Z7982 Long term (current) use of aspirin: Secondary | ICD-10-CM | POA: Insufficient documentation

## 2015-11-21 LAB — CBC
HCT: 33.5 % — ABNORMAL LOW (ref 38.5–50.0)
HEMOGLOBIN: 11.1 g/dL — AB (ref 13.2–17.1)
MCH: 30.2 pg (ref 27.0–33.0)
MCHC: 33.1 g/dL (ref 32.0–36.0)
MCV: 91 fL (ref 80.0–100.0)
MPV: 12.7 fL — ABNORMAL HIGH (ref 7.5–12.5)
Platelets: 202 10*3/uL (ref 140–400)
RBC: 3.68 MIL/uL — AB (ref 4.20–5.80)
RDW: 13.5 % (ref 11.0–15.0)
WBC: 6.4 10*3/uL (ref 3.8–10.8)

## 2015-11-21 LAB — POCT GLYCOSYLATED HEMOGLOBIN (HGB A1C): Hemoglobin A1C: 7.9

## 2015-11-21 LAB — GLUCOSE, POCT (MANUAL RESULT ENTRY): POC Glucose: 171 mg/dl — AB (ref 70–99)

## 2015-11-21 MED ORDER — AMOXICILLIN-POT CLAVULANATE 875-125 MG PO TABS: 1.0000 | ORAL_TABLET | Freq: Two times a day (BID) | ORAL | Status: DC

## 2015-11-21 MED ORDER — IBUPROFEN 800 MG PO TABS: 800.0000 mg | ORAL_TABLET | Freq: Three times a day (TID) | ORAL | Status: DC | PRN

## 2015-11-21 MED ORDER — TRAZODONE HCL 50 MG PO TABS: 25.0000 mg | ORAL_TABLET | Freq: Every evening | ORAL | Status: DC | PRN

## 2015-11-21 MED FILL — traZODone HCL 50 MG TABS: 50 | 30 days supply | Qty: 30 | Fill #0

## 2015-11-21 MED FILL — AMOX-CLAV 875-125 MG TABLET: 875-125 | 10 days supply | Qty: 20 | Fill #0

## 2015-11-21 MED FILL — IBUPROFEN 800 MG TABLET: 800 | 10 days supply | Qty: 30 | Fill #0

## 2015-11-21 NOTE — Progress Notes (Signed)
C/C lt foot pain  Swollen and worm to the touch  Pain since surgery 4 month ago Pain worsen with walk,  Pian scale # 8

## 2015-11-21 NOTE — Progress Notes (Signed)
Subjective:  Patient ID: Shane [name], male    DOB: 31-Oct-1970  Age: 45 y.o. MRN: [id number]  CC: Foot Pain   HPI Shane [name] presents for    1. Foot pain: L foot. Since amputation on 08/03/15. He has mild redness, swelling and pain. His incision site has healed well. He has no fever or chill. He continues to wear his post op shoe due the pain.   2. Insomnia: he takes depakote 1000 mg nightly. He has trouble falling asleep and staying asleep. He has pain in his foot.   3. DM2: compliant with medications. No low CBGs.     Past Surgical History  Procedure Laterality Date  . Inguinal hernia repair Bilateral ~ 1983- ~ 1986  . Tonsillectomy  ~ 1985  . Circumcision    . Amputation Left 08/03/2015    Procedure: AMPUTATION LEFT GREAT TOE;  Surgeon: Newt Minion, MD;  Location: [facility];  Service: Orthopedics;  Laterality: Left;    Outpatient Prescriptions Prior to Visit  Medication Sig Dispense Refill  . amLODipine (NORVASC) 5 MG tablet Take 1 tablet (5 mg total) by mouth daily. 90 tablet 3  . aspirin EC 325 MG tablet Take 1 tablet (325 mg total) by mouth daily. 30 tablet 0  . atorvastatin (LIPITOR) 80 MG tablet Take 1 tablet (80 mg total) by mouth every morning. 30 tablet 5  . carvedilol (COREG) 12.5 MG tablet TAKE 1 TABLET BY MOUTH 2 TIMES DAILY WITH A MEAL 60 tablet 3  . divalproex (DEPAKOTE) 500 MG DR tablet Take 1 tablet (500 mg total) by mouth at bedtime. (Patient taking differently: Take 1,000 mg by mouth at bedtime. ) 30 tablet 5  . ferrous sulfate 325 (65 FE) MG tablet Take 1 tablet (325 mg total) by mouth 2 (two) times daily with a meal. 60 tablet 5  . gabapentin (NEURONTIN) 100 MG capsule Take 1 capsule (100 mg total) by mouth 3 (three) times daily. 90 capsule 3  . glipiZIDE (GLUCOTROL) 10 MG tablet Take 1 tablet (10 mg total) by mouth 2 (two) times daily before a meal. 60 tablet 5  . lisinopril (PRINIVIL,ZESTRIL) 40 MG tablet Take 1 tablet (40 mg total) by mouth daily. 30  tablet 5  . metFORMIN (GLUCOPHAGE) 1000 MG tablet Take 1 tablet (1,000 mg total) by mouth 2 (two) times daily with a meal. 60 tablet 5  . omeprazole (PRILOSEC) 20 MG capsule Take 1 capsule (20 mg total) by mouth daily. 30 capsule 5  . ibuprofen (ADVIL,MOTRIN) 600 MG tablet Take 1 tablet (600 mg total) by mouth every 8 (eight) hours as needed. 30 tablet 0   No facility-administered medications prior to visit.    ROS Review of Systems  Constitutional: Negative for fever, chills, fatigue and unexpected weight change.  Eyes: Negative for visual disturbance.  Respiratory: Negative for cough and shortness of breath.   Cardiovascular: Negative for chest pain, palpitations and leg swelling.  Gastrointestinal: Negative for nausea, vomiting, abdominal pain, diarrhea, constipation and blood in stool.  Endocrine: Negative for polydipsia, polyphagia and polyuria.  Musculoskeletal: Positive for arthralgias and gait problem. Negative for myalgias, back pain and neck pain.  Skin: Negative for rash.  Allergic/Immunologic: Negative for immunocompromised state.  Hematological: Negative for adenopathy. Does not bruise/bleed easily.  Psychiatric/Behavioral: Positive for sleep disturbance. Negative for suicidal ideas and dysphoric mood. The patient is not nervous/anxious.     Objective:  BP 128/84 mmHg  Pulse 72  Temp(Src) 97.5 F (36.4 C) (Oral)  Resp 16  Ht 5\' 8"  (1.727 m)  Wt 175 lb (79.379 kg)  BMI 26.61 kg/m2  SpO2 100%  BP/Weight 11/21/2015 09/04/2015 [id number]  Systolic BP 0000000 [id number]  Diastolic BP 84 123XX123 83  Wt. (Lbs) 175 176 -  BMI 26.61 26.77 -    Physical Exam  Constitutional: He appears well-developed and well-nourished. No distress.  HENT:  Head: Normocephalic and atraumatic.  Neck: Normal range of motion. Neck supple.  Cardiovascular: Normal rate, regular rhythm, normal heart sounds and intact distal pulses.   Pulses:      Dorsalis pedis pulses are 1+ on the right side, and 2+ on  the left side.       Posterior tibial pulses are 1+ on the right side, and 2+ on the left side.  Pulmonary/Chest: Effort normal and breath sounds normal.  Musculoskeletal: He exhibits no edema.       Feet:  Neurological: He is alert.  Skin: Skin is warm and dry. No rash noted. No erythema.  Psychiatric: He has a normal mood and affect.    Lab Results  Component Value Date   HGBA1C 7.1 08/18/2015   Lab Results  Component Value Date   HGBA1C 7.9 11/21/2015    CBG 171 Assessment & Plan:  Shane [name] was seen today for foot pain.  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 -     ibuprofen (ADVIL,MOTRIN) 800 MG tablet; Take 1 tablet (800 mg total) by mouth every 8 (eight) hours as needed. -     VAS [place] LOWER EXTREMITY VENOUS (DVT); Future  Insomnia -     traZODone (DESYREL) 50 MG tablet; Take 0.5-1 tablets (25-50 mg total) by mouth at bedtime as needed for sleep.    No orders of the defined types were placed in this encounter.    Follow-up: Return in about 2 weeks (around 12/05/2015) for L foot pain and sweling .   Boykin Nearing MD

## 2015-11-21 NOTE — Patient Instructions (Addendum)
Shane [name] was seen today for foot pain.  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 -     ibuprofen (ADVIL,MOTRIN) 800 MG tablet; Take 1 tablet (800 mg total) by mouth every 8 (eight) hours as needed.  Insomnia -     traZODone (DESYREL) 50 MG tablet; Take 0.5-1 tablets (25-50 mg total) by mouth at bedtime as needed for sleep.   Call your psychiatrist and a new Rx for your prozac 10 mg and depakote 1000 mg  be sent to the on site pharmacy.   You will be called with lab results If D dimer (clotting marker) is elevated I will order a venous doppler of your left leg Elevated your leg at rest Take the augmentin antibiotic and ibuprofen Please complete foot x-ray  F/u in 2 weeks for recheck of L foot   Dr. Adrian Blackwater

## 2015-11-22 LAB — D-DIMER, QUANTITATIVE (NOT AT ARMC)

## 2015-11-23 ENCOUNTER — Encounter (HOSPITAL_COMMUNITY): Payer: Self-pay | Admitting: Family Medicine

## 2015-11-23 ENCOUNTER — Emergency Department (HOSPITAL_COMMUNITY)
Admission: EM | Admit: 2015-11-23 | Discharge: 2015-11-24 | Disposition: A | Discharge status: Home / Self Care | Payer: Medicaid Other | Attending: Emergency Medicine | Admitting: Emergency Medicine

## 2015-11-23 DIAGNOSIS — E119 Type 2 diabetes mellitus without complications: Secondary | ICD-10-CM | POA: Diagnosis not present

## 2015-11-23 DIAGNOSIS — Z791 Long term (current) use of non-steroidal anti-inflammatories (NSAID): Secondary | ICD-10-CM | POA: Insufficient documentation

## 2015-11-23 DIAGNOSIS — I5032 Chronic diastolic (congestive) heart failure: Secondary | ICD-10-CM | POA: Diagnosis not present

## 2015-11-23 DIAGNOSIS — Z8673 Personal history of transient ischemic attack (TIA), and cerebral infarction without residual deficits: Secondary | ICD-10-CM | POA: Insufficient documentation

## 2015-11-23 DIAGNOSIS — M79675 Pain in left toe(s): Secondary | ICD-10-CM | POA: Diagnosis not present

## 2015-11-23 DIAGNOSIS — Z89412 Acquired absence of left great toe: Secondary | ICD-10-CM | POA: Insufficient documentation

## 2015-11-23 DIAGNOSIS — I110 Hypertensive heart disease with heart failure: Secondary | ICD-10-CM | POA: Insufficient documentation

## 2015-11-23 DIAGNOSIS — E785 Hyperlipidemia, unspecified: Secondary | ICD-10-CM | POA: Insufficient documentation

## 2015-11-23 DIAGNOSIS — Z79899 Other long term (current) drug therapy: Secondary | ICD-10-CM | POA: Diagnosis not present

## 2015-11-23 DIAGNOSIS — Z7984 Long term (current) use of oral hypoglycemic drugs: Secondary | ICD-10-CM | POA: Diagnosis not present

## 2015-11-23 DIAGNOSIS — M79672 Pain in left foot: Secondary | ICD-10-CM

## 2015-11-23 NOTE — Discharge Instructions (Signed)
[name],  Nice meeting you! Please follow-up with your primary care provider and orthopedic surgeon as needed. Return to the emergency department if you develop fevers, chills, nausea/vomiting, chest pain, shortness of breath, increasing pain, new/worsening symptoms. Feel better soon!  S. Wendie Simmer, PA-C

## 2015-11-23 NOTE — ED Provider Notes (Signed)
CSN: [id number]     Arrival date & time 11/23/15  [id number] History   First MD Initiated Contact with Patient 11/23/15 1003     Chief Complaint  Patient presents with  . Foot Pain   HPI   Shane [name] is a 45 y.o. male PMH significant for DM II, HTN, hyperlipidemia presenting with a 1 month history of left second toe pain. He states 4 months ago, he had his left hallux amputated. He states since then, he has had pain at the base of his left hallux as well. He has associated discoloration at his left second toenail but denies injury. He describes his pain as constant, worse with walking, 9/10 pain scale, nonradiating, sharp. He has been followed by his primary care provider for this who started him on an antibiotic initially but he was switched to Augmentin 2 days ago. He states taking 800 mg ibuprofen helps his pain. He endorses subjective fevers at home. He denies chest pain, shortness of breath, nausea, vomiting.   Past Medical History  Diagnosis Date  . Hypertension     a. 08/2014 Admitted with hypertensive urgency.  . Chest pain     a. 2015 Reportedly normal stress test in [place].  Marland Kitchen TIA (transient ischemic attack) 08/2014; 03/2015    a. 08/2014 in setting of hypertensive urgency.  . Anemia   . Refusal of blood transfusions as patient is Jehovah's Witness   . Hyperlipidemia   . Chronic diastolic CHF (congestive heart failure) (HCC)     a.03/2015 Echo: EF 55-60%, Gr 1 DD, mild MR, triv PR.  . Type II diabetes mellitus ([place])   . Sleep apnea   . GERD (gastroesophageal reflux disease)   . Anxiety   . Depression   . Bipolar disorder [facility])    Past Surgical History  Procedure Laterality Date  . Inguinal hernia repair Bilateral ~ 1983- ~ 1986  . Tonsillectomy  ~ 1985  . Circumcision    . Amputation Left 08/03/2015    Procedure: AMPUTATION LEFT GREAT TOE;  Surgeon: Newt Minion, MD;  Location: [facility];  Service: Orthopedics;  Laterality: Left;   Family History  Problem Relation Age of Onset  .  Hypertension Mother   . Diabetes Mother   . Hyperlipidemia Mother   . Heart disease Mother     s/p pacemaker  . Diabetes Father   . Hypertension Father   . Stroke Father   . Heart attack Father     first MI @ 63.  . Stroke Brother    Social History  Substance Use Topics  . Smoking status: Never Smoker   . Smokeless tobacco: Never Used  . Alcohol Use: No    Review of Systems  Ten systems are reviewed and are negative for acute change except as noted in the HPI  Allergies  Eggs or egg-derived products; Other; and Milk-related compounds  Home Medications   Prior to Admission medications   Medication Sig Start Date End Date Taking? Authorizing Provider  amLODipine (NORVASC) 5 MG tablet Take 1 tablet (5 mg total) by mouth daily. 09/04/15   Maren Reamer, MD  amoxicillin-clavulanate (AUGMENTIN) 875-125 MG tablet Take 1 tablet by mouth 2 (two) times daily. 11/21/15   Boykin Nearing, MD  aspirin EC 325 MG tablet Take 1 tablet (325 mg total) by mouth daily. 09/04/15   Maren Reamer, MD  atorvastatin (LIPITOR) 80 MG tablet Take 1 tablet (80 mg total) by mouth every morning. 08/18/15   Josalyn  Funches, MD  carvedilol (COREG) 12.5 MG tablet TAKE 1 TABLET BY MOUTH 2 TIMES DAILY WITH A MEAL 05/17/15   Josalyn Funches, MD  divalproex (DEPAKOTE) 500 MG DR tablet Take 1 tablet (500 mg total) by mouth at bedtime. Patient taking differently: Take 1,000 mg by mouth at bedtime.  08/18/15   Josalyn Funches, MD  ferrous sulfate 325 (65 FE) MG tablet Take 1 tablet (325 mg total) by mouth 2 (two) times daily with a meal. 08/18/15   Josalyn Funches, MD  FLUoxetine (PROZAC) 10 MG tablet Take 10 mg by mouth daily.    Historical Provider, MD  gabapentin (NEURONTIN) 100 MG capsule Take 1 capsule (100 mg total) by mouth 3 (three) times daily. 09/04/15   Maren Reamer, MD  glipiZIDE (GLUCOTROL) 10 MG tablet Take 1 tablet (10 mg total) by mouth 2 (two) times daily before a meal. 08/18/15   Josalyn Funches, MD    ibuprofen (ADVIL,MOTRIN) 800 MG tablet Take 1 tablet (800 mg total) by mouth every 8 (eight) hours as needed. 11/21/15   Josalyn Funches, MD  lisinopril (PRINIVIL,ZESTRIL) 40 MG tablet Take 1 tablet (40 mg total) by mouth daily. 08/18/15   Boykin Nearing, MD  metFORMIN (GLUCOPHAGE) 1000 MG tablet Take 1 tablet (1,000 mg total) by mouth 2 (two) times daily with a meal. 08/18/15   Josalyn Funches, MD  omeprazole (PRILOSEC) 20 MG capsule Take 1 capsule (20 mg total) by mouth daily. 08/18/15   Josalyn Funches, MD  traZODone (DESYREL) 50 MG tablet Take 0.5-1 tablets (25-50 mg total) by mouth at bedtime as needed for sleep. 11/21/15   Josalyn Funches, MD   BP 96/69 mmHg  Pulse 80  Temp(Src) 97.6 F (36.4 C) (Oral)  Resp 16  SpO2 100% Physical Exam  Constitutional: He appears well-developed and well-nourished. No distress.  HENT:  Head: Normocephalic and atraumatic.  Mouth/Throat: Oropharynx is clear and moist. No oropharyngeal exudate.  Eyes: Conjunctivae are normal. Pupils are equal, round, and reactive to light. Right eye exhibits no discharge. Left eye exhibits no discharge. No scleral icterus.  Neck: No tracheal deviation present.  Cardiovascular: Normal rate, regular rhythm, normal heart sounds and intact distal pulses.  Exam reveals no gallop and no friction rub.   No murmur heard. Pulmonary/Chest: Effort normal and breath sounds normal. No respiratory distress. He has no wheezes. He has no rales. He exhibits no tenderness.  Abdominal: Soft. Bowel sounds are normal. He exhibits no distension and no mass. There is no tenderness. There is no rebound and no guarding.  Musculoskeletal: Normal range of motion. He exhibits tenderness. He exhibits no edema.  NVI BL.   Lymphadenopathy:    He has no cervical adenopathy.  Neurological: He is alert. Coordination normal.  Skin: Skin is warm and dry. No rash noted. He is not diaphoretic. No erythema.  Left 2nd digit toenail with discolorations. Left hallux  s/p amputation. No erythema, edema, purulent drainage  Psychiatric: He has a normal mood and affect. His behavior is normal.  Nursing note and vitals reviewed.           ED Course  Procedures  Imaging Review Dg Foot 2 Views Left  11/22/2015  CLINICAL DATA:  Left foot pain/ swelling at site of great toe amputation EXAM: LEFT FOOT - 2 VIEW COMPARISON:  08/13/2015 FINDINGS: Status post left 1st digit amputation at the MTP joint. No fracture or dislocation is seen. The joint spaces are preserved. Mild soft tissue swelling overlying the 1st digit. No  radiopaque foreign body is seen. IMPRESSION: Mild soft tissue swelling at the site of 1st digit amputation. No radiopaque foreign body is seen. No fracture or dislocation is seen. Electronically Signed   By: Julian Hy M.D.   On: 11/22/2015 09:36   I have personally reviewed and evaluated these images and lab results as part of my medical decision-making.  MDM   Final diagnoses:  Foot pain, left   Patient nontoxic appearing, VSS. Will hold off on x-rays and lab work today, as patient just received these 2 days ago. Doubt DVT given pain is localized to toes, patient does not have calf pain, no leg edema. X-ray on 6/7 demonstrates mild soft tissue swelling at the site of first digit amputation. CBC on 6/6 demonstrates no leukocytosis. Per record review, his A1c is within normal limits. Patient may be safely discharged home. Encouraged patient to continue plan with PCP, and follow-up with ortho surgeon as needed. Discussed reasons for return. Patient to follow-up with primary care provider within one week. Patient in understanding and agreement with the plan.  Hayward Lions, PA-C 11/30/15 [id number]  Veryl Speak, MD 11/30/15 [phone]

## 2015-11-23 NOTE — ED Notes (Signed)
Pt here with pain in left foot. Pt had amputation 4 months ago on great toe. Pt 2nd toe back around nail bed and per family left foot is darker in color. sts very painful

## 2015-11-23 NOTE — ED Notes (Signed)
Pt is in stable condition upon d/c and is escorted from ED via wheelchair. 

## 2015-11-24 DIAGNOSIS — M79672 Pain in left foot: Secondary | ICD-10-CM | POA: Insufficient documentation

## 2015-11-24 NOTE — Assessment & Plan Note (Addendum)
L foot pain s/p amputation Mild erythema and swelling Neuropathy vs venous stasis vs cellulitis vs DVT  Plan Course of Augmentin CBC NSAID D dimer did not result  Plan for LLE venous doppler  Elevation

## 2015-11-28 ENCOUNTER — Ambulatory Visit (HOSPITAL_COMMUNITY)
Admission: RE | Admit: 2015-11-28 | Discharge: 2015-11-28 | Disposition: A | Discharge status: Home / Self Care | Payer: Medicaid Other | Source: Ambulatory Visit | Attending: Family Medicine | Admitting: Family Medicine

## 2015-11-28 DIAGNOSIS — I5032 Chronic diastolic (congestive) heart failure: Secondary | ICD-10-CM | POA: Diagnosis not present

## 2015-11-28 DIAGNOSIS — G4730 Sleep apnea, unspecified: Secondary | ICD-10-CM | POA: Insufficient documentation

## 2015-11-28 DIAGNOSIS — M79672 Pain in left foot: Secondary | ICD-10-CM

## 2015-11-28 DIAGNOSIS — E785 Hyperlipidemia, unspecified: Secondary | ICD-10-CM | POA: Diagnosis not present

## 2015-11-28 DIAGNOSIS — E119 Type 2 diabetes mellitus without complications: Secondary | ICD-10-CM | POA: Diagnosis not present

## 2015-11-28 DIAGNOSIS — K219 Gastro-esophageal reflux disease without esophagitis: Secondary | ICD-10-CM | POA: Diagnosis not present

## 2015-11-28 DIAGNOSIS — F419 Anxiety disorder, unspecified: Secondary | ICD-10-CM | POA: Diagnosis not present

## 2015-11-28 DIAGNOSIS — I110 Hypertensive heart disease with heart failure: Secondary | ICD-10-CM | POA: Insufficient documentation

## 2015-11-28 DIAGNOSIS — F319 Bipolar disorder, unspecified: Secondary | ICD-10-CM | POA: Diagnosis not present

## 2015-11-28 NOTE — Progress Notes (Addendum)
*  Preliminary Results* Left lower extremity venous duplex completed. Left lower extremity is negative for deep vein thrombosis. There is no evidence of left Baker's cyst.  Incidental finding: There is a heterogenous area of the left groin measuring 1.1cm, suggestive of a possible prominent inguinal lymph node.   Of note: patient expressed his concern to me regarding the arterial circulation in his left leg, describing claudication symptoms (pain in left foot and leg when walking). I explained to him that we would need a doctor's order to do an arterial evaluation (ABI). If this test is needed in the future we would be happy to complete it with a doctor's order.  Attempted to call preliminary results to [phone], however there was no answer.  11/28/2015 1:22 PM  Maudry Mayhew, RVT, RDCS, RDMS

## 2015-11-30 ENCOUNTER — Telehealth: Payer: Self-pay | Admitting: Family Medicine

## 2015-11-30 ENCOUNTER — Encounter (HOSPITAL_COMMUNITY): Payer: Self-pay | Admitting: Emergency Medicine

## 2015-11-30 ENCOUNTER — Emergency Department (HOSPITAL_COMMUNITY): Payer: Medicaid Other

## 2015-11-30 ENCOUNTER — Inpatient Hospital Stay (HOSPITAL_COMMUNITY)
Admission: EM | Admit: 2015-11-30 | Discharge: 2015-12-04 | DRG: 617 | Disposition: A | Discharge status: Home / Self Care | Payer: Medicaid Other | Attending: Internal Medicine | Admitting: Internal Medicine

## 2015-11-30 DIAGNOSIS — E11628 Type 2 diabetes mellitus with other skin complications: Secondary | ICD-10-CM

## 2015-11-30 DIAGNOSIS — Z823 Family history of stroke: Secondary | ICD-10-CM

## 2015-11-30 DIAGNOSIS — G459 Transient cerebral ischemic attack, unspecified: Secondary | ICD-10-CM | POA: Diagnosis present

## 2015-11-30 DIAGNOSIS — L0291 Cutaneous abscess, unspecified: Secondary | ICD-10-CM | POA: Diagnosis present

## 2015-11-30 DIAGNOSIS — E119 Type 2 diabetes mellitus without complications: Secondary | ICD-10-CM

## 2015-11-30 DIAGNOSIS — R0789 Other chest pain: Secondary | ICD-10-CM | POA: Diagnosis present

## 2015-11-30 DIAGNOSIS — F319 Bipolar disorder, unspecified: Secondary | ICD-10-CM | POA: Diagnosis present

## 2015-11-30 DIAGNOSIS — Z91011 Allergy to milk products: Secondary | ICD-10-CM

## 2015-11-30 DIAGNOSIS — L089 Local infection of the skin and subcutaneous tissue, unspecified: Secondary | ICD-10-CM | POA: Diagnosis present

## 2015-11-30 DIAGNOSIS — M79672 Pain in left foot: Secondary | ICD-10-CM | POA: Diagnosis present

## 2015-11-30 DIAGNOSIS — Z8673 Personal history of transient ischemic attack (TIA), and cerebral infarction without residual deficits: Secondary | ICD-10-CM

## 2015-11-30 DIAGNOSIS — I5032 Chronic diastolic (congestive) heart failure: Secondary | ICD-10-CM | POA: Diagnosis present

## 2015-11-30 DIAGNOSIS — Z7984 Long term (current) use of oral hypoglycemic drugs: Secondary | ICD-10-CM

## 2015-11-30 DIAGNOSIS — Z7982 Long term (current) use of aspirin: Secondary | ICD-10-CM

## 2015-11-30 DIAGNOSIS — Z833 Family history of diabetes mellitus: Secondary | ICD-10-CM

## 2015-11-30 DIAGNOSIS — F3481 Disruptive mood dysregulation disorder: Secondary | ICD-10-CM

## 2015-11-30 DIAGNOSIS — Z888 Allergy status to other drugs, medicaments and biological substances status: Secondary | ICD-10-CM

## 2015-11-30 DIAGNOSIS — Z8249 Family history of ischemic heart disease and other diseases of the circulatory system: Secondary | ICD-10-CM

## 2015-11-30 DIAGNOSIS — G4733 Obstructive sleep apnea (adult) (pediatric): Secondary | ICD-10-CM | POA: Diagnosis present

## 2015-11-30 DIAGNOSIS — L02612 Cutaneous abscess of left foot: Secondary | ICD-10-CM | POA: Diagnosis present

## 2015-11-30 DIAGNOSIS — I10 Essential (primary) hypertension: Secondary | ICD-10-CM | POA: Diagnosis present

## 2015-11-30 DIAGNOSIS — Z89412 Acquired absence of left great toe: Secondary | ICD-10-CM

## 2015-11-30 DIAGNOSIS — K219 Gastro-esophageal reflux disease without esophagitis: Secondary | ICD-10-CM | POA: Diagnosis present

## 2015-11-30 DIAGNOSIS — F418 Other specified anxiety disorders: Secondary | ICD-10-CM | POA: Diagnosis present

## 2015-11-30 DIAGNOSIS — L0390 Cellulitis, unspecified: Secondary | ICD-10-CM

## 2015-11-30 DIAGNOSIS — L03116 Cellulitis of left lower limb: Secondary | ICD-10-CM | POA: Diagnosis present

## 2015-11-30 DIAGNOSIS — I110 Hypertensive heart disease with heart failure: Secondary | ICD-10-CM | POA: Diagnosis present

## 2015-11-30 DIAGNOSIS — Z91012 Allergy to eggs: Secondary | ICD-10-CM

## 2015-11-30 DIAGNOSIS — E785 Hyperlipidemia, unspecified: Secondary | ICD-10-CM | POA: Diagnosis present

## 2015-11-30 DIAGNOSIS — E1149 Type 2 diabetes mellitus with other diabetic neurological complication: Secondary | ICD-10-CM

## 2015-11-30 DIAGNOSIS — Z79899 Other long term (current) drug therapy: Secondary | ICD-10-CM

## 2015-11-30 DIAGNOSIS — R079 Chest pain, unspecified: Secondary | ICD-10-CM | POA: Diagnosis present

## 2015-11-30 LAB — BASIC METABOLIC PANEL
ANION GAP: 4 — AB (ref 5–15)
BUN: 16 mg/dL (ref 6–20)
CALCIUM: 9.3 mg/dL (ref 8.9–10.3)
CHLORIDE: 109 mmol/L (ref 101–111)
CO2: 29 mmol/L (ref 22–32)
CREATININE: 0.99 mg/dL (ref 0.61–1.24)
GFR calc Af Amer: >60 mL/min (ref >60)
GFR calc non Af Amer: >60 mL/min (ref >60)
GLUCOSE: 136 mg/dL — AB (ref 65–99)
Potassium: 4.7 mmol/L (ref 3.5–5.1)
Sodium: 142 mmol/L (ref 135–145)

## 2015-11-30 LAB — CBC WITH DIFFERENTIAL/PLATELET
BASOS ABS: 0 10*3/uL (ref 0.0–0.1)
BASOS PCT: 0 %
EOS ABS: 0.3 10*3/uL (ref 0.0–0.7)
Eosinophils Relative: 3 %
HEMATOCRIT: 32.1 % — AB (ref 39.0–52.0)
Hemoglobin: 10.3 g/dL — ABNORMAL LOW (ref 13.0–17.0)
Lymphocytes Relative: 22 %
Lymphs Abs: 2 10*3/uL (ref 0.7–4.0)
MCH: 29.6 pg (ref 26.0–34.0)
MCHC: 32.1 g/dL (ref 30.0–36.0)
MCV: 92.2 fL (ref 78.0–100.0)
MONO ABS: 0.4 10*3/uL (ref 0.1–1.0)
Monocytes Relative: 5 %
NEUTROS ABS: 6.4 10*3/uL (ref 1.7–7.7)
NEUTROS PCT: 70 %
Platelets: 206 10*3/uL (ref 150–400)
RBC: 3.48 MIL/uL — ABNORMAL LOW (ref 4.22–5.81)
RDW: 13 % (ref 11.5–15.5)
WBC: 9 10*3/uL (ref 4.0–10.5)

## 2015-11-30 LAB — CBG MONITORING, ED: GLUCOSE-CAPILLARY: 147 mg/dL — AB (ref 65–99)

## 2015-11-30 NOTE — ED Notes (Signed)
Pt reports that while he was waiting his foot pain "moved up my body to my back... radiates to central chest area...then the pain has gone to my head."  He reports that he has an infection in his toe that is now numb and his pain in his joints.

## 2015-11-30 NOTE — ED Notes (Signed)
PT st's he is diabetic and started having signs of infection in second toe on left foot approx 2 weeks  Pt has been on antibiotics for 5 days with no improvement.  Pt c/o pain and swelling to left foot

## 2015-11-30 NOTE — Telephone Encounter (Signed)
Shane [name] from [facility] called stating that a specimen was sent over on 11/21/15 and that they were unable to perform the test B/c the specimen was hemolyzed. Please f/u

## 2015-11-30 NOTE — ED Provider Notes (Signed)
CSN: [id number]     Arrival date & time 11/30/15  1538 History   First MD Initiated Contact with Patient 11/30/15 2325     Chief Complaint  Patient presents with  . Foot Pain  . Chest Pain     (Consider location/radiation/quality/duration/timing/severity/associated sxs/prior Treatment) HPI   PMH of hypertension, CP, TIA, anemia, hyperlipidemia, anemia, Type II  Diabetes, CHF, depression, anxiety, amputation to left hallux amputated. He was seen on 11/23/2015 for infection to the left foot. His PCP, Dr. Adrian Blackwater had started him on Augmentin on 11/21/2015. He says that the foot is becoming more painful. The pain is spreading up his leg, it becoming more red and more swollen.  He says that he now has pain going up his left leg, into his back, into his chest and into his head due to the wait times this afternoon. Denies fevers, SOB, nausea, vomiting or diarrhea.  Past Medical History  Diagnosis Date  . Hypertension     a. 08/2014 Admitted with hypertensive urgency.  . Chest pain     a. 2015 Reportedly normal stress test in [place].  Marland Kitchen TIA (transient ischemic attack) 08/2014; 03/2015    a. 08/2014 in setting of hypertensive urgency.  . Anemia   . Refusal of blood transfusions as patient is Jehovah's Witness   . Hyperlipidemia   . Chronic diastolic CHF (congestive heart failure) (HCC)     a.03/2015 Echo: EF 55-60%, Gr 1 DD, mild MR, triv PR.  . Type II diabetes mellitus ([place])   . Sleep apnea   . GERD (gastroesophageal reflux disease)   . Anxiety   . Depression   . Bipolar disorder [facility])    Past Surgical History  Procedure Laterality Date  . Inguinal hernia repair Bilateral ~ 1983- ~ 1986  . Tonsillectomy  ~ 1985  . Circumcision    . Amputation Left 08/03/2015    Procedure: AMPUTATION LEFT GREAT TOE;  Surgeon: Newt Minion, MD;  Location: [place];  Service: Orthopedics;  Laterality: Left;   Family History  Problem Relation Age of Onset  . Hypertension Mother   . Diabetes Mother   .  Hyperlipidemia Mother   . Heart disease Mother     s/p pacemaker  . Diabetes Father   . Hypertension Father   . Stroke Father   . Heart attack Father     first MI @ 53.  . Stroke Brother    Social History  Substance Use Topics  . Smoking status: Never Smoker   . Smokeless tobacco: Never Used  . Alcohol Use: No    Review of Systems  Review of Systems All other systems negative except as documented in the HPI. All pertinent positives and negatives as reviewed in the HPI.   Allergies  Eggs or egg-derived products; Other; and Milk-related compounds  Home Medications   Prior to Admission medications   Medication Sig Start Date End Date Taking? Authorizing Provider  amLODipine (NORVASC) 5 MG tablet Take 1 tablet (5 mg total) by mouth daily. 09/04/15  Yes Maren Reamer, MD  amoxicillin-clavulanate (AUGMENTIN) 875-125 MG tablet Take 1 tablet by mouth 2 (two) times daily. 11/21/15  Yes Boykin Nearing, MD  aspirin EC 325 MG tablet Take 1 tablet (325 mg total) by mouth daily. 09/04/15  Yes Maren Reamer, MD  atorvastatin (LIPITOR) 80 MG tablet Take 1 tablet (80 mg total) by mouth every morning. 08/18/15  Yes Josalyn Funches, MD  carvedilol (COREG) 12.5 MG tablet TAKE 1 TABLET  BY MOUTH 2 TIMES DAILY WITH A MEAL 05/17/15  Yes Josalyn Funches, MD  divalproex (DEPAKOTE) 500 MG DR tablet Take 1 tablet (500 mg total) by mouth at bedtime. Patient taking differently: Take 1,000 mg by mouth at bedtime.  08/18/15  Yes Josalyn Funches, MD  ferrous sulfate 325 (65 FE) MG tablet Take 1 tablet (325 mg total) by mouth 2 (two) times daily with a meal. 08/18/15  Yes Josalyn Funches, MD  FLUoxetine (PROZAC) 10 MG tablet Take 10 mg by mouth daily.   Yes Historical Provider, MD  gabapentin (NEURONTIN) 100 MG capsule Take 1 capsule (100 mg total) by mouth 3 (three) times daily. 09/04/15  Yes Dawn Lazarus Gowda, MD  glipiZIDE (GLUCOTROL) 10 MG tablet Take 1 tablet (10 mg total) by mouth 2 (two) times daily before a  meal. 08/18/15  Yes Josalyn Funches, MD  ibuprofen (ADVIL,MOTRIN) 800 MG tablet Take 1 tablet (800 mg total) by mouth every 8 (eight) hours as needed. 11/21/15  Yes Josalyn Funches, MD  lisinopril (PRINIVIL,ZESTRIL) 40 MG tablet Take 1 tablet (40 mg total) by mouth daily. 08/18/15  Yes Boykin Nearing, MD  metFORMIN (GLUCOPHAGE) 1000 MG tablet Take 1 tablet (1,000 mg total) by mouth 2 (two) times daily with a meal. 08/18/15  Yes Josalyn Funches, MD  omeprazole (PRILOSEC) 20 MG capsule Take 1 capsule (20 mg total) by mouth daily. 08/18/15  Yes Josalyn Funches, MD  traZODone (DESYREL) 50 MG tablet Take 0.5-1 tablets (25-50 mg total) by mouth at bedtime as needed for sleep. 11/21/15  Yes Josalyn Funches, MD   BP 169/91 mmHg  Pulse 68  Temp(Src) 97.7 F (36.5 C) (Oral)  Resp 18  Ht 5\' 8"  (1.727 m)  Wt 79.833 kg  BMI 26.77 kg/m2  SpO2 100% Physical Exam  Constitutional: He appears well-developed and well-nourished. No distress.  HENT:  Head: Normocephalic and atraumatic.  Eyes: Pupils are equal, round, and reactive to light.  Neck: Normal range of motion. Neck supple.  Cardiovascular: Normal rate and regular rhythm.   Pulmonary/Chest: Effort normal.  Abdominal: Soft.  Musculoskeletal:  Left second digit toenail is absent. Left great toe amuptation. Patient has associated erythema and induration w/ tenderness to palpation of the distal foot.  Neurological: He is alert.  Skin: Skin is warm and dry.  Nursing note and vitals reviewed.         ED Course  Procedures (including critical care time) Labs Review Labs Reviewed  CBC WITH DIFFERENTIAL/PLATELET - Abnormal; Notable for the following:    RBC 3.48 (*)    Hemoglobin 10.3 (*)    HCT 32.1 (*)    All other components within normal limits  BASIC METABOLIC PANEL - Abnormal; Notable for the following:    Glucose, Bld 136 (*)    Anion gap 4 (*)    All other components within normal limits  CBG MONITORING, ED - Abnormal; Notable for the  following:    Glucose-Capillary 147 (*)    All other components within normal limits  I-STAT TROPOININ, ED    Imaging Review Dg Chest 2 View  12/01/2015  CLINICAL DATA:  Chest pain for couple of hours. EXAM: CHEST  2 VIEW COMPARISON:  08/26/2015 FINDINGS: The heart size and mediastinal contours are within normal limits. Both lungs are clear. The visualized skeletal structures are unremarkable. IMPRESSION: No active cardiopulmonary disease. Electronically Signed   By: Lucienne Capers M.D.   On: 12/01/2015 01:37   Dg Foot Complete Left  12/01/2015  CLINICAL DATA:  Left foot wound. Diabetes. Second toe nail fell off 5 days ago. EXAM: LEFT FOOT - COMPLETE 3+ VIEW COMPARISON:  11/21/2015 FINDINGS: Previous amputation of the left first toe at the metatarsal-phalangeal joint level. Soft tissues at the site of resection or prominent but unchanged since previous study. No radiopaque soft tissue foreign bodies or gas collections. Vascular calcifications. No bone erosion or sclerosis to suggest osteomyelitis. No acute fracture or dislocation. IMPRESSION: Previous amputation of the left first toe. No evidence of osteomyelitis. Electronically Signed   By: Lucienne Capers M.D.   On: 12/01/2015 00:27   I have personally reviewed and evaluated these images and lab results as part of my medical decision-making.   EKG Interpretation None     MDM   Final diagnoses:  Diabetic foot infection ([place])   Normal cardiac eval - neg Trop, normal EKG.  Patient has failed outpatient treatment and will require admission for IV abx. Vancomycin and Zosyn ordered to dose per pharmacy.  Patient is afebrile and has otherwise normal vital signs. White count is 9.0 BG is 136.  Pt admit to obs, [facility], triad hospitalist, Dr. Blaine Hamper, Tele  Filed Vitals:   11/30/15 2351 11/30/15 2352  BP: 169/91   Pulse:  68  Temp:    Resp:        Delos Haring, PA-C 12/01/15 0216  Ripley Fraise, MD 12/01/15 [phone]

## 2015-11-30 NOTE — ED Notes (Signed)
Patient transported to X-ray 

## 2015-12-01 ENCOUNTER — Observation Stay (HOSPITAL_COMMUNITY): Payer: Self-pay

## 2015-12-01 ENCOUNTER — Observation Stay (HOSPITAL_BASED_OUTPATIENT_CLINIC_OR_DEPARTMENT_OTHER): Payer: Medicaid Other

## 2015-12-01 ENCOUNTER — Observation Stay (HOSPITAL_COMMUNITY): Payer: Medicaid Other

## 2015-12-01 ENCOUNTER — Emergency Department (HOSPITAL_COMMUNITY): Payer: Medicaid Other

## 2015-12-01 DIAGNOSIS — G459 Transient cerebral ischemic attack, unspecified: Secondary | ICD-10-CM

## 2015-12-01 DIAGNOSIS — I10 Essential (primary) hypertension: Secondary | ICD-10-CM

## 2015-12-01 DIAGNOSIS — M79672 Pain in left foot: Secondary | ICD-10-CM | POA: Diagnosis present

## 2015-12-01 DIAGNOSIS — M79609 Pain in unspecified limb: Secondary | ICD-10-CM | POA: Diagnosis not present

## 2015-12-01 DIAGNOSIS — E1149 Type 2 diabetes mellitus with other diabetic neurological complication: Secondary | ICD-10-CM

## 2015-12-01 DIAGNOSIS — Z823 Family history of stroke: Secondary | ICD-10-CM | POA: Diagnosis not present

## 2015-12-01 DIAGNOSIS — F418 Other specified anxiety disorders: Secondary | ICD-10-CM | POA: Diagnosis present

## 2015-12-01 DIAGNOSIS — R079 Chest pain, unspecified: Secondary | ICD-10-CM | POA: Diagnosis present

## 2015-12-01 DIAGNOSIS — F319 Bipolar disorder, unspecified: Secondary | ICD-10-CM | POA: Diagnosis present

## 2015-12-01 DIAGNOSIS — I5032 Chronic diastolic (congestive) heart failure: Secondary | ICD-10-CM | POA: Diagnosis present

## 2015-12-01 DIAGNOSIS — R0789 Other chest pain: Secondary | ICD-10-CM | POA: Diagnosis present

## 2015-12-01 DIAGNOSIS — L03116 Cellulitis of left lower limb: Secondary | ICD-10-CM | POA: Diagnosis present

## 2015-12-01 DIAGNOSIS — Z91012 Allergy to eggs: Secondary | ICD-10-CM | POA: Diagnosis not present

## 2015-12-01 DIAGNOSIS — K219 Gastro-esophageal reflux disease without esophagitis: Secondary | ICD-10-CM | POA: Diagnosis present

## 2015-12-01 DIAGNOSIS — Z91011 Allergy to milk products: Secondary | ICD-10-CM | POA: Diagnosis not present

## 2015-12-01 DIAGNOSIS — E11628 Type 2 diabetes mellitus with other skin complications: Secondary | ICD-10-CM | POA: Diagnosis present

## 2015-12-01 DIAGNOSIS — Z8249 Family history of ischemic heart disease and other diseases of the circulatory system: Secondary | ICD-10-CM | POA: Diagnosis not present

## 2015-12-01 DIAGNOSIS — E785 Hyperlipidemia, unspecified: Secondary | ICD-10-CM | POA: Diagnosis present

## 2015-12-01 DIAGNOSIS — I110 Hypertensive heart disease with heart failure: Secondary | ICD-10-CM | POA: Diagnosis present

## 2015-12-01 DIAGNOSIS — G4733 Obstructive sleep apnea (adult) (pediatric): Secondary | ICD-10-CM | POA: Diagnosis present

## 2015-12-01 DIAGNOSIS — Z8673 Personal history of transient ischemic attack (TIA), and cerebral infarction without residual deficits: Secondary | ICD-10-CM | POA: Diagnosis not present

## 2015-12-01 DIAGNOSIS — L02612 Cutaneous abscess of left foot: Secondary | ICD-10-CM | POA: Diagnosis present

## 2015-12-01 DIAGNOSIS — E1169 Type 2 diabetes mellitus with other specified complication: Secondary | ICD-10-CM

## 2015-12-01 DIAGNOSIS — Z888 Allergy status to other drugs, medicaments and biological substances status: Secondary | ICD-10-CM | POA: Diagnosis not present

## 2015-12-01 DIAGNOSIS — Z89412 Acquired absence of left great toe: Secondary | ICD-10-CM | POA: Diagnosis not present

## 2015-12-01 DIAGNOSIS — Z7984 Long term (current) use of oral hypoglycemic drugs: Secondary | ICD-10-CM | POA: Diagnosis not present

## 2015-12-01 DIAGNOSIS — Z833 Family history of diabetes mellitus: Secondary | ICD-10-CM | POA: Diagnosis not present

## 2015-12-01 DIAGNOSIS — L089 Local infection of the skin and subcutaneous tissue, unspecified: Secondary | ICD-10-CM | POA: Diagnosis present

## 2015-12-01 DIAGNOSIS — Z79899 Other long term (current) drug therapy: Secondary | ICD-10-CM | POA: Diagnosis not present

## 2015-12-01 DIAGNOSIS — Z7982 Long term (current) use of aspirin: Secondary | ICD-10-CM | POA: Diagnosis not present

## 2015-12-01 LAB — ECHOCARDIOGRAM COMPLETE
E decel time: 169 msec
E/e' ratio: 12.59
FS: 26 % — AB (ref 28–44)
HEIGHTINCHES: 68 in
IVS/LV PW RATIO, ED: 1.06
LA ID, A-P, ES: 34 mm
LA diam end sys: 34 mm
LA vol: 55.2 mL
LADIAMINDEX: 1.78 cm/m2
LAVOLA4C: 47.5 mL
LAVOLIN: 28.9 mL/m2
LV E/e' medial: 12.59
LV TDI E'MEDIAL: 7.07
LV dias vol index: 63 mL/m2
LV e' LATERAL: 7.94 cm/s
LVDIAVOL: 121 mL (ref 62–150)
LVEEAVG: 12.59
LVOT area: 3.8 cm2
LVOTD: 22 mm
LVSYSVOL: 56 mL (ref 21–61)
LVSYSVOLIN: 29 mL/m2
MV Dec: 169
MV Peak grad: 4 mmHg
MV pk A vel: 72.8 m/s
MV pk E vel: 100 m/s
PW: 10.7 mm — AB (ref 0.6–1.1)
RV TAPSE: 21.7 mm
Simpson's disk: 54
Stroke v: 66 ml
TDI e' lateral: 7.94
WEIGHTICAEL: 2719.59 [oz_av]

## 2015-12-01 LAB — CBC
HEMATOCRIT: 32.3 % — AB (ref 39.0–52.0)
HEMOGLOBIN: 10.2 g/dL — AB (ref 13.0–17.0)
MCH: 29 pg (ref 26.0–34.0)
MCHC: 31.6 g/dL (ref 30.0–36.0)
MCV: 91.8 fL (ref 78.0–100.0)
Platelets: 205 10*3/uL (ref 150–400)
RBC: 3.52 MIL/uL — AB (ref 4.22–5.81)
RDW: 13 % (ref 11.5–15.5)
WBC: 7.9 10*3/uL (ref 4.0–10.5)

## 2015-12-01 LAB — PROTIME-INR
INR: 1.07 (ref 0.00–1.49)
PROTHROMBIN TIME: 14.1 s (ref 11.6–15.2)

## 2015-12-01 LAB — GLUCOSE, CAPILLARY
Glucose-Capillary: 119 mg/dL — ABNORMAL HIGH (ref 65–99)
Glucose-Capillary: 156 mg/dL — ABNORMAL HIGH (ref 65–99)
Glucose-Capillary: 294 mg/dL — ABNORMAL HIGH (ref 65–99)
Glucose-Capillary: 152 mg/dL — ABNORMAL HIGH (ref 65–99)

## 2015-12-01 LAB — C-REACTIVE PROTEIN: CRP: <0.5 mg/dL (ref <1.0)

## 2015-12-01 LAB — I-STAT TROPONIN, ED: Troponin i, poc: 0 ng/mL (ref 0.00–0.08)

## 2015-12-01 LAB — BASIC METABOLIC PANEL
ANION GAP: 4 — AB (ref 5–15)
BUN: 17 mg/dL (ref 6–20)
CHLORIDE: 107 mmol/L (ref 101–111)
CO2: 27 mmol/L (ref 22–32)
Calcium: 9.2 mg/dL (ref 8.9–10.3)
Creatinine, Ser: 0.86 mg/dL (ref 0.61–1.24)
Glucose, Bld: 139 mg/dL — ABNORMAL HIGH (ref 65–99)
POTASSIUM: 4.3 mmol/L (ref 3.5–5.1)
SODIUM: 138 mmol/L (ref 135–145)

## 2015-12-01 LAB — D-DIMER, QUANTITATIVE (NOT AT ARMC): D DIMER QUANT: 0.32 ug{FEU}/mL (ref 0.00–0.50)

## 2015-12-01 LAB — TROPONIN I
Troponin I: <0.03 ng/mL (ref <0.031)
Troponin I: <0.03 ng/mL (ref <0.031)
Troponin I: <0.03 ng/mL (ref <0.031)

## 2015-12-01 LAB — LACTIC ACID, PLASMA
LACTIC ACID, VENOUS: 0.7 mmol/L (ref 0.5–2.0)
Lactic Acid, Venous: 0.7 mmol/L (ref 0.5–2.0)

## 2015-12-01 LAB — SEDIMENTATION RATE: Sed Rate: 13 mm/hr (ref 0–16)

## 2015-12-01 LAB — BRAIN NATRIURETIC PEPTIDE: B NATRIURETIC PEPTIDE 5: 18.2 pg/mL (ref 0.0–100.0)

## 2015-12-01 LAB — PROCALCITONIN

## 2015-12-01 LAB — APTT: aPTT: 29 seconds (ref 24–37)

## 2015-12-01 LAB — HIV ANTIBODY (ROUTINE TESTING W REFLEX): HIV Screen 4th Generation wRfx: NONREACTIVE

## 2015-12-01 MED ORDER — TRAZODONE HCL 50 MG PO TABS
25.0000 mg | ORAL_TABLET | Freq: Every evening | ORAL | Status: DC | PRN
  Administered 2015-12-02 – 2015-12-03 (×2): 50 mg via ORAL
  Filled 2015-12-01 (×2): qty 1

## 2015-12-01 MED ORDER — PIPERACILLIN-TAZOBACTAM 3.375 G IVPB
3.3750 g | Freq: Three times a day (TID) | INTRAVENOUS | Status: DC
  Administered 2015-12-01 – 2015-12-02 (×3): 3.375 g via INTRAVENOUS
  Filled 2015-12-01 (×5): qty 50

## 2015-12-01 MED ORDER — MORPHINE SULFATE (PF) 4 MG/ML IV SOLN
4.0000 mg | Freq: Once | INTRAVENOUS | Status: AC
  Administered 2015-12-01: 4 mg via INTRAVENOUS
  Filled 2015-12-01: qty 1

## 2015-12-01 MED ORDER — LISINOPRIL 40 MG PO TABS
40.0000 mg | ORAL_TABLET | Freq: Every day | ORAL | Status: DC
  Administered 2015-12-01 – 2015-12-04 (×4): 40 mg via ORAL
  Filled 2015-12-01: qty 2
  Filled 2015-12-01 (×4): qty 1

## 2015-12-01 MED ORDER — AMLODIPINE BESYLATE 5 MG PO TABS
5.0000 mg | ORAL_TABLET | Freq: Every day | ORAL | Status: DC
  Administered 2015-12-01 – 2015-12-04 (×4): 5 mg via ORAL
  Filled 2015-12-01 (×4): qty 1

## 2015-12-01 MED ORDER — SODIUM CHLORIDE 0.9 % IV SOLN
INTRAVENOUS | Status: DC
  Administered 2015-12-01 – 2015-12-02 (×4): via INTRAVENOUS

## 2015-12-01 MED ORDER — INSULIN ASPART 100 UNIT/ML ~~LOC~~ SOLN: 0.0000 [IU] | Freq: Every day | SUBCUTANEOUS | Status: DC

## 2015-12-01 MED ORDER — CARVEDILOL 12.5 MG PO TABS
12.5000 mg | ORAL_TABLET | Freq: Two times a day (BID) | ORAL | Status: DC
  Administered 2015-12-01 – 2015-12-04 (×7): 12.5 mg via ORAL
  Filled 2015-12-01 (×7): qty 1

## 2015-12-01 MED ORDER — VANCOMYCIN HCL IN DEXTROSE 1-5 GM/200ML-% IV SOLN
1000.0000 mg | INTRAVENOUS | Status: AC
  Administered 2015-12-01: 1000 mg via INTRAVENOUS
  Filled 2015-12-01: qty 200

## 2015-12-01 MED ORDER — ASPIRIN EC 325 MG PO TBEC
325.0000 mg | DELAYED_RELEASE_TABLET | Freq: Every day | ORAL | Status: DC
  Administered 2015-12-01 – 2015-12-02 (×2): 325 mg via ORAL
  Filled 2015-12-01 (×2): qty 1

## 2015-12-01 MED ORDER — ACETAMINOPHEN 325 MG PO TABS
650.0000 mg | ORAL_TABLET | Freq: Four times a day (QID) | ORAL | Status: DC | PRN
  Administered 2015-12-01: 650 mg via ORAL
  Filled 2015-12-01: qty 2

## 2015-12-01 MED ORDER — SODIUM CHLORIDE 0.9% FLUSH
3.0000 mL | Freq: Two times a day (BID) | INTRAVENOUS | Status: DC
  Administered 2015-12-01 – 2015-12-04 (×4): 3 mL via INTRAVENOUS

## 2015-12-01 MED ORDER — ONDANSETRON HCL 4 MG/2ML IJ SOLN
4.0000 mg | Freq: Three times a day (TID) | INTRAMUSCULAR | Status: DC | PRN
  Administered 2015-12-01 – 2015-12-02 (×2): 4 mg via INTRAVENOUS
  Filled 2015-12-01 (×2): qty 2

## 2015-12-01 MED ORDER — GABAPENTIN 100 MG PO CAPS
100.0000 mg | ORAL_CAPSULE | Freq: Three times a day (TID) | ORAL | Status: DC
  Administered 2015-12-01 – 2015-12-04 (×9): 100 mg via ORAL
  Filled 2015-12-01 (×10): qty 1

## 2015-12-01 MED ORDER — VANCOMYCIN HCL IN DEXTROSE 750-5 MG/150ML-% IV SOLN
750.0000 mg | Freq: Two times a day (BID) | INTRAVENOUS | Status: DC
  Filled 2015-12-01: qty 150

## 2015-12-01 MED ORDER — ATORVASTATIN CALCIUM 80 MG PO TABS
80.0000 mg | ORAL_TABLET | Freq: Every day | ORAL | Status: DC
  Administered 2015-12-01 – 2015-12-02 (×2): 80 mg via ORAL
  Filled 2015-12-01 (×2): qty 1

## 2015-12-01 MED ORDER — DIVALPROEX SODIUM 500 MG PO DR TAB
1000.0000 mg | DELAYED_RELEASE_TABLET | Freq: Every day | ORAL | Status: DC
  Administered 2015-12-01 – 2015-12-03 (×3): 1000 mg via ORAL
  Filled 2015-12-01: qty 2
  Filled 2015-12-01: qty 4
  Filled 2015-12-01 (×2): qty 2
  Filled 2015-12-01: qty 4

## 2015-12-01 MED ORDER — ACETAMINOPHEN 650 MG RE SUPP
650.0000 mg | Freq: Four times a day (QID) | RECTAL | Status: DC | PRN
Start: 2015-12-01 — End: 2015-12-02

## 2015-12-01 MED ORDER — INSULIN ASPART 100 UNIT/ML ~~LOC~~ SOLN
0.0000 [IU] | Freq: Three times a day (TID) | SUBCUTANEOUS | Status: DC
  Administered 2015-12-01: 5 [IU] via SUBCUTANEOUS
  Administered 2015-12-01 – 2015-12-03 (×5): 2 [IU] via SUBCUTANEOUS
  Administered 2015-12-04: 1 [IU] via SUBCUTANEOUS
  Administered 2015-12-04: 2 [IU] via SUBCUTANEOUS

## 2015-12-01 MED ORDER — VANCOMYCIN HCL IN DEXTROSE 750-5 MG/150ML-% IV SOLN
750.0000 mg | Freq: Three times a day (TID) | INTRAVENOUS | Status: DC
  Administered 2015-12-01 – 2015-12-04 (×10): 750 mg via INTRAVENOUS
  Filled 2015-12-01 (×12): qty 150

## 2015-12-01 MED ORDER — FERROUS SULFATE 325 (65 FE) MG PO TABS
325.0000 mg | ORAL_TABLET | Freq: Two times a day (BID) | ORAL | Status: DC
  Administered 2015-12-01 – 2015-12-04 (×7): 325 mg via ORAL
  Filled 2015-12-01 (×7): qty 1

## 2015-12-01 MED ORDER — OXYCODONE-ACETAMINOPHEN 5-325 MG PO TABS
2.0000 | ORAL_TABLET | Freq: Four times a day (QID) | ORAL | Status: DC | PRN
  Administered 2015-12-01 – 2015-12-04 (×8): 2 via ORAL
  Filled 2015-12-01 (×8): qty 2

## 2015-12-01 MED ORDER — PANTOPRAZOLE SODIUM 40 MG PO TBEC
40.0000 mg | DELAYED_RELEASE_TABLET | Freq: Every day | ORAL | Status: DC
  Administered 2015-12-01 – 2015-12-04 (×4): 40 mg via ORAL
  Filled 2015-12-01 (×4): qty 1

## 2015-12-01 MED ORDER — ENOXAPARIN SODIUM 40 MG/0.4ML ~~LOC~~ SOLN
40.0000 mg | SUBCUTANEOUS | Status: DC
  Administered 2015-12-01: 40 mg via SUBCUTANEOUS
  Filled 2015-12-01: qty 0.4

## 2015-12-01 MED ORDER — FLUOXETINE HCL 10 MG PO CAPS
10.0000 mg | ORAL_CAPSULE | Freq: Every day | ORAL | Status: DC
  Administered 2015-12-01 – 2015-12-04 (×4): 10 mg via ORAL
  Filled 2015-12-01 (×4): qty 1

## 2015-12-01 MED ORDER — NITROGLYCERIN 0.4 MG SL SUBL
0.4000 mg | SUBLINGUAL_TABLET | SUBLINGUAL | Status: DC | PRN
Start: 2015-12-01 — End: 2015-12-04
  Administered 2015-12-01 – 2015-12-04 (×4): 0.4 mg via SUBLINGUAL
  Filled 2015-12-01 (×2): qty 1

## 2015-12-01 MED ORDER — KETOROLAC TROMETHAMINE 30 MG/ML IJ SOLN
30.0000 mg | Freq: Four times a day (QID) | INTRAMUSCULAR | Status: DC | PRN
  Administered 2015-12-01: 30 mg via INTRAVENOUS
  Filled 2015-12-01 (×3): qty 1

## 2015-12-01 MED ORDER — ONDANSETRON HCL 4 MG/2ML IJ SOLN
4.0000 mg | Freq: Once | INTRAMUSCULAR | Status: AC
  Administered 2015-12-01: 4 mg via INTRAVENOUS
  Filled 2015-12-01: qty 2

## 2015-12-01 MED ORDER — PIPERACILLIN-TAZOBACTAM 3.375 G IVPB 30 MIN
3.3750 g | INTRAVENOUS | Status: AC
  Administered 2015-12-01: 3.375 g via INTRAVENOUS
  Filled 2015-12-01: qty 50

## 2015-12-01 NOTE — Progress Notes (Signed)
Progress Note    Shane [name]  ZCH:[id number] DOB: 12-16-1970  DOA: 11/30/2015 PCP: Minerva Ends, MD    Brief Narrative:   Vinod Mikesell is an 45 y.o. male with medical history significant of hypertension, hyperlipidemia, diabetes mellitus, GERD, depression, anxiety, TIA, diastolic dysfunction, OSA not on CPAP, bipolar disorder, lower GI bleeding, Who was admitted 11/30/15 with chief complaint of left foot swelling and pain as well as chest pain. Being treated for left foot cellulitis.   Assessment/Plan:   Principal problems:  Left foot cellulitis with pain X-ray did not show osteo. Patient had lower extremity venous Doppler on 6/13 by his PCP, which was negative for DVT. D-dimer not elevated, so doubt DVT. Continue empiric vancomycin and Zosyn. Follow-up blood cultures. ESR/CRP not elevated. Follow-up MRI of the left foot. Follow-up lower extremity Dopplers/ABI.  Chest pain Chest x-ray is negative for infiltration. Initial trop negative. EKG negative for ischemic changes. Continue ASA, lipitor and coreg, and prn NTG. Follow-up 2-D echocardiogram.Pain is very atypical and that it is brought on and relieved by certain movements.  Active problems:  History of TIA Continue aspirin, Lipitor.  DM-II Last A1c 7.9 on 11/21/15, not well controled. Patient is taking metformin and glipizide at home. Currently being managed with insulin sensitive SSI. CBGs 119-147.  HLD Last LDL was 61 on 09/08/15. Continue home medications: Lipitor.  HTN: Continue Coreg, amlodipine, lisinopril.  GERD Continue Protonix.  Depression with anxiety and bipolar Stable, no suicidal or homicidal ideations. Continue home medications: Prozac and Depakote.  Chronic diastolic congestive heart failure 2-D echo 04/03/15 showed EF of 55% with grade 1 diastolic dysfunction. Patient is not taking diuretics at home. Continue aspirin and Coreg. Chest x-ray clear.   Family Communication/Anticipated D/C date and  plan/Code Status   DVT prophylaxis: Lovenox ordered. Code Status: Full Code.  Family Communication: No family currently at the bedside. Disposition Plan: Home to-3 days if medically stable.   Medical Consultants:    None.   Procedures:    Anti-Infectives:   Vancomycin 12/01/15---> Zosyn 12/01/15--->   Subjective:    Alistair Senft tells me that he does not feel any pain in his foot given his history of neuropathy. He does report some chest pain and headache. Says he has had some blurred vision. Describes the pain in his chest is 5/10, sharp in quality, intermittent, and brought on or eased off by certain movements.  Objective:    Filed Vitals:   12/01/15 0200 12/01/15 0245 12/01/15 0330 12/01/15 0434  BP: 156/97 184/96 175/101 155/90  Pulse: 81 81 73 78  Temp:    97.5 F (36.4 C)  TempSrc:    Oral  Resp: 0  15 16  Height:    5' 8"  (1.727 m)  Weight:    77.1 kg (169 lb 15.6 oz)  SpO2: 96% 98% 98% 100%   No intake or output data in the 24 hours ending 12/01/15 0822 Filed Weights   11/30/15 1614 12/01/15 0434  Weight: 79.833 kg (176 lb) 77.1 kg (169 lb 15.6 oz)    Exam: General exam: Appears calm and comfortable.  Respiratory system: Clear to auscultation. Respiratory effort normal. Cardiovascular system: S1 & S2 heard, RRR. No JVD,  rubs, gallops or clicks. No murmurs. Gastrointestinal system: Abdomen is nondistended, soft and nontender. No organomegaly or masses felt. Normal bowel sounds heard. Central nervous system: Alert and oriented. No focal neurological deficits. Extremities: No clubbing, edema, or cyanosis.Left great toe amputation noted with second left  digit toenail absent, with erythema swelling and tenderness of the left foot and left lower leg. Pulses diminished. Skin: Left lower leg as described above, extensive scars both legs.  Psychiatry: Judgement and insight appear normal. Mood & affect appropriate.   Data Reviewed:   I have personally  reviewed following labs and imaging studies:  Labs: Basic Metabolic Panel:  Recent Labs Lab 11/30/15 1639 12/01/15 0431  NA 142 138  K 4.7 4.3  CL 109 107  CO2 29 27  GLUCOSE 136* 139*  BUN 16 17  CREATININE 0.99 0.86  CALCIUM 9.3 9.2   GFR Estimated Creatinine Clearance: 103.8 mL/min (by C-G formula based on Cr of 0.86). Liver Function Tests: No results for input(s): AST, ALT, ALKPHOS, BILITOT, PROT, ALBUMIN in the last 168 hours. No results for input(s): LIPASE, AMYLASE in the last 168 hours. No results for input(s): AMMONIA in the last 168 hours. Coagulation profile  Recent Labs Lab 12/01/15 0431  INR 1.07    CBC:  Recent Labs Lab 11/30/15 1639 12/01/15 0431  WBC 9.0 7.9  NEUTROABS 6.4  --   HGB 10.3* 10.2*  HCT 32.1* 32.3*  MCV 92.2 91.8  PLT 206 205   Cardiac Enzymes:  Recent Labs Lab 12/01/15 0431  TROPONINI <0.03   BNP (last 3 results) No results for input(s): PROBNP in the last 8760 hours. CBG:  Recent Labs Lab 11/30/15 1617 12/01/15 0758  GLUCAP 147* 119*   D-Dimer:  Recent Labs  12/01/15 0431  DDIMER 0.32   Sepsis Labs:  Recent Labs Lab 11/30/15 1639 12/01/15 0431 12/01/15 0457  PROCALCITON  --  <0.10  --   WBC 9.0 7.9  --   LATICACIDVEN  --   --  0.7   Urine analysis:    Component Value Date/Time   COLORURINE YELLOW 04/24/2015 1056   APPEARANCEUR CLEAR 04/24/2015 1056   LABSPEC 1.013 04/24/2015 1056   PHURINE 5.0 04/24/2015 1056   GLUCOSEU NEGATIVE 04/24/2015 1056   HGBUR MODERATE* 04/24/2015 1056   BILIRUBINUR NEGATIVE 04/24/2015 1056   KETONESUR NEGATIVE 04/24/2015 1056   PROTEINUR NEGATIVE 04/24/2015 1056   UROBILINOGEN 0.2 04/24/2015 1056   NITRITE NEGATIVE 04/24/2015 1056   LEUKOCYTESUR NEGATIVE 04/24/2015 1056   Microbiology Recent Results (from the past 240 hour(s))  Culture, blood (x 2)     Status: None (Preliminary result)   Collection Time: 12/01/15  4:45 AM  Result Value Ref Range Status    Specimen Description BLOOD RIGHT ARM  Final   Special Requests IN PEDIATRIC BOTTLE [facility]  Final   Culture PENDING  Incomplete   Report Status PENDING  Incomplete    Radiology: Dg Chest 2 View  12/01/2015  CLINICAL DATA:  Chest pain for couple of hours. EXAM: CHEST  2 VIEW COMPARISON:  08/26/2015 FINDINGS: The heart size and mediastinal contours are within normal limits. Both lungs are clear. The visualized skeletal structures are unremarkable. IMPRESSION: No active cardiopulmonary disease. Electronically Signed   By: Lucienne Capers M.D.   On: 12/01/2015 01:37   Dg Foot Complete Left  12/01/2015  CLINICAL DATA:  Left foot wound. Diabetes. Second toe nail fell off 5 days ago. EXAM: LEFT FOOT - COMPLETE 3+ VIEW COMPARISON:  11/21/2015 FINDINGS: Previous amputation of the left first toe at the metatarsal-phalangeal joint level. Soft tissues at the site of resection or prominent but unchanged since previous study. No radiopaque soft tissue foreign bodies or gas collections. Vascular calcifications. No bone erosion or sclerosis to suggest osteomyelitis. No acute  fracture or dislocation. IMPRESSION: Previous amputation of the left first toe. No evidence of osteomyelitis. Electronically Signed   By: Lucienne Capers M.D.   On: 12/01/2015 00:27    Medications:   . amLODipine  5 mg Oral Daily  . aspirin EC  325 mg Oral Daily  . atorvastatin  80 mg Oral q1800  . carvedilol  12.5 mg Oral BID WC  . divalproex  1,000 mg Oral QHS  . enoxaparin (LOVENOX) injection  40 mg Subcutaneous Q24H  . ferrous sulfate  325 mg Oral BID WC  . FLUoxetine  10 mg Oral Daily  . gabapentin  100 mg Oral TID  . insulin aspart  0-5 Units Subcutaneous QHS  . insulin aspart  0-9 Units Subcutaneous TID WC  . lisinopril  40 mg Oral Daily  . pantoprazole  40 mg Oral Daily  . piperacillin-tazobactam (ZOSYN)  IV  3.375 g Intravenous Q8H  . sodium chloride flush  3 mL Intravenous Q12H  . vancomycin  750 mg Intravenous Q12H    Continuous Infusions: . sodium chloride 75 mL/hr at 12/01/15 0445    Time spent: 35 minutes.  The patient is medically complex with multiple co-morbidities and is at high risk for clinical deterioration and requires high complexity decision making.   [facility] Pager [phone]. If unable to reach me by pager, please call my cell phone at [phone].  *Please refer to [email or web address], password [email or web address] to get updated schedule on who will round on this patient, as hospitalists switch teams weekly. If 7PM-7AM, please contact night-coverage at [email or web address], password [email or web address] for any overnight needs.  12/01/2015, 8:22 AM

## 2015-12-01 NOTE — Progress Notes (Signed)
   12/01/15 1000  Clinical Encounter Type  Visited With Patient  Visit Type Initial;Spiritual support  Referral From Nurse  Spiritual Encounters  Spiritual Needs Other (Comment) (Initial visit as per consult)  Stress Factors  Patient Stress Factors None identified

## 2015-12-01 NOTE — H&P (Addendum)
History and Physical    Shane [name] FBP:[id number] DOB: 1971/01/18 DOA: 11/30/2015  Referring MD/NP/PA:   PCP: Minerva Ends, MD   Patient coming from:  The patient is coming from home.  At baseline, pt is independent for most of ADL.     Chief Complaint: left foot swelling and pain, and chest pain  HPI: Shane [name] is a 45 y.o. male with medical history significant of hypertension, hyperlipidemia, diabetes mellitus, GERD, depression, anxiety, TIA, diastolic dysfunction, OSA not on CPAP, bipolar disorder, lower GI bleeding, who presents with left foot swelling and pain, and chest pain.  Patient reports that he has been having left foot swelling and pain in the past 2 weeks. His left foot is also red. Patient has subjective fever and chills. He also has nausea, but no vomiting, abdominal pain or diarrhea. He has been treated by his PCP with Augmentin. He has been taking Augmentin in the past 5 days without improvement. His foot pain and swelling have been progressively getting worse. Now he has also left lower leg swelling and tenderness over left calf area. He also reports chest pain. It is located in the left chest, sharp, nonradiating, constant, 7-8 out of 10 in severity. It is not aggravated or alleviated by any known factors. It is not pleuritic. He has some mild dry cough and mild shortness of breath. Patient does not have symptoms of UTI or unilateral weakness. Of note, his PCP did LE venous doppler to his left leg on 11/28/15, which is negative for DVT.  ED Course: pt was found to have WBC 9.0, temperature normal, no tachycardia, no tachypnea, electrolytes and renal function okay. Chest x-ray is negative. X-ray of left foot is negative for osteomyelitis. Pt is placed on tele bed.  Review of Systems:   General: no fevers, chills, no changes in body weight, has fatigue HEENT: no blurry vision, hearing changes or sore throat Pulm: has dyspnea, coughing, no wheezing CV: has chest  pain, no palpitations Abd: has nausea, no vomiting, abdominal pain, diarrhea, constipation GU: no dysuria, burning on urination, increased urinary frequency, hematuria  Ext: has left foot and left leg edema Neuro: no unilateral weakness, numbness, or tingling, no vision change or hearing loss Skin: no rash MSK: No muscle spasm, no deformity, no limitation of range of movement in spin Heme: No easy bruising.  Travel history: No recent long distant travel.  Allergy:  Allergies  Allergen Reactions  . Eggs Or Egg-Derived Products Diarrhea  . Other Other (See Comments)    Red meat causes stomach pains, bloating and diarrhea  . Milk-Related Compounds Diarrhea and Other (See Comments)    Any dairy products  - diarrhea and bloating    Past Medical History  Diagnosis Date  . Hypertension     a. 08/2014 Admitted with hypertensive urgency.  . Chest pain     a. 2015 Reportedly normal stress test in [place].  Marland Kitchen TIA (transient ischemic attack) 08/2014; 03/2015    a. 08/2014 in setting of hypertensive urgency.  . Anemia   . Refusal of blood transfusions as patient is Jehovah's Witness   . Hyperlipidemia   . Chronic diastolic CHF (congestive heart failure) (HCC)     a.03/2015 Echo: EF 55-60%, Gr 1 DD, mild MR, triv PR.  . Type II diabetes mellitus ([place])   . Sleep apnea   . GERD (gastroesophageal reflux disease)   . Anxiety   . Depression   . Bipolar disorder ([place])  Past Surgical History  Procedure Laterality Date  . Inguinal hernia repair Bilateral ~ 1983- ~ 1986  . Tonsillectomy  ~ 1985  . Circumcision    . Amputation Left 08/03/2015    Procedure: AMPUTATION LEFT GREAT TOE;  Surgeon: Newt Minion, MD;  Location: [facility];  Service: Orthopedics;  Laterality: Left;    Social History:  reports that he has never smoked. He has never used smokeless tobacco. He reports that he does not drink alcohol or use illicit drugs.  Family History:  Family History  Problem Relation Age of Onset  .  Hypertension Mother   . Diabetes Mother   . Hyperlipidemia Mother   . Heart disease Mother     s/p pacemaker  . Diabetes Father   . Hypertension Father   . Stroke Father   . Heart attack Father     first MI @ 87.  . Stroke Brother      Prior to Admission medications   Medication Sig Start Date End Date Taking? Authorizing Provider  amLODipine (NORVASC) 5 MG tablet Take 1 tablet (5 mg total) by mouth daily. 09/04/15  Yes Maren Reamer, MD  amoxicillin-clavulanate (AUGMENTIN) 875-125 MG tablet Take 1 tablet by mouth 2 (two) times daily. 11/21/15  Yes Boykin Nearing, MD  aspirin EC 325 MG tablet Take 1 tablet (325 mg total) by mouth daily. 09/04/15  Yes Maren Reamer, MD  atorvastatin (LIPITOR) 80 MG tablet Take 1 tablet (80 mg total) by mouth every morning. 08/18/15  Yes Josalyn Funches, MD  carvedilol (COREG) 12.5 MG tablet TAKE 1 TABLET BY MOUTH 2 TIMES DAILY WITH A MEAL 05/17/15  Yes Josalyn Funches, MD  divalproex (DEPAKOTE) 500 MG DR tablet Take 1 tablet (500 mg total) by mouth at bedtime. Patient taking differently: Take 1,000 mg by mouth at bedtime.  08/18/15  Yes Josalyn Funches, MD  ferrous sulfate 325 (65 FE) MG tablet Take 1 tablet (325 mg total) by mouth 2 (two) times daily with a meal. 08/18/15  Yes Josalyn Funches, MD  FLUoxetine (PROZAC) 10 MG tablet Take 10 mg by mouth daily.   Yes Historical Provider, MD  gabapentin (NEURONTIN) 100 MG capsule Take 1 capsule (100 mg total) by mouth 3 (three) times daily. 09/04/15  Yes Dawn Lazarus Gowda, MD  glipiZIDE (GLUCOTROL) 10 MG tablet Take 1 tablet (10 mg total) by mouth 2 (two) times daily before a meal. 08/18/15  Yes Josalyn Funches, MD  ibuprofen (ADVIL,MOTRIN) 800 MG tablet Take 1 tablet (800 mg total) by mouth every 8 (eight) hours as needed. 11/21/15  Yes Josalyn Funches, MD  lisinopril (PRINIVIL,ZESTRIL) 40 MG tablet Take 1 tablet (40 mg total) by mouth daily. 08/18/15  Yes Boykin Nearing, MD  metFORMIN (GLUCOPHAGE) 1000 MG tablet Take 1  tablet (1,000 mg total) by mouth 2 (two) times daily with a meal. 08/18/15  Yes Josalyn Funches, MD  omeprazole (PRILOSEC) 20 MG capsule Take 1 capsule (20 mg total) by mouth daily. 08/18/15  Yes Josalyn Funches, MD  traZODone (DESYREL) 50 MG tablet Take 0.5-1 tablets (25-50 mg total) by mouth at bedtime as needed for sleep. 11/21/15  Yes Boykin Nearing, MD    Physical Exam: Filed Vitals:   11/30/15 1614 11/30/15 2026 11/30/15 2351 11/30/15 2352  BP: 100/70 114/73 169/91   Pulse: 69 64  68  Temp: 97.7 F (36.5 C)     TempSrc: Oral     Resp: 18 18    Height: 5' 8"  (1.727 m)  Weight: 79.833 kg (176 lb)     SpO2: 98% 100%  100%   General: Not in acute distress HEENT:       Eyes: PERRL, EOMI, no scleral icterus.       ENT: No discharge from the ears and nose, no pharynx injection, no tonsillar enlargement.        Neck: No JVD, no bruit, no mass felt. Heme: No neck lymph node enlargement. Cardiac: S1/S2, RRR, No murmurs, No gallops or rubs. Pulm: No rales, wheezing, rhonchi or rubs. Abd: Soft, nondistended, nontender, no rebound pain, no organomegaly, BS present. GU: No hematuria Ext: s/p of Left great toe amuptation. Left second digit toenail is absent, but no active drainage. Has erythema, swelling and tenderness in left foot. Left lower leg is also swelling. Has tenderness over left calf area. Left DP/PT pulses are difficult to palpate, but can be easily detected using portal doppler (I personally performed a Doppler). Right DP/PT pulse normal.  2+DP/PT pulse bilaterally. Musculoskeletal: No joint deformities, No joint redness or warmth, no limitation of ROM in spin. Skin: No rashes.  Neuro: Alert, oriented X3, cranial nerves II-XII grossly intact, moves all extremities normally. Psych: Patient is not psychotic, no suicidal or hemocidal ideation.  Labs on Admission: I have personally reviewed following labs and imaging studies  CBC:  Recent Labs Lab 11/30/15 1639  WBC 9.0    NEUTROABS 6.4  HGB 10.3*  HCT 32.1*  MCV 92.2  PLT 938   Basic Metabolic Panel:  Recent Labs Lab 11/30/15 1639  NA 142  K 4.7  CL 109  CO2 29  GLUCOSE 136*  BUN 16  CREATININE 0.99  CALCIUM 9.3   GFR: Estimated Creatinine Clearance: 90.2 mL/min (by C-G formula based on Cr of 0.99). Liver Function Tests: No results for input(s): AST, ALT, ALKPHOS, BILITOT, PROT, ALBUMIN in the last 168 hours. No results for input(s): LIPASE, AMYLASE in the last 168 hours. No results for input(s): AMMONIA in the last 168 hours. Coagulation Profile: No results for input(s): INR, PROTIME in the last 168 hours. Cardiac Enzymes: No results for input(s): CKTOTAL, CKMB, CKMBINDEX, TROPONINI in the last 168 hours. BNP (last 3 results) No results for input(s): PROBNP in the last 8760 hours. HbA1C: No results for input(s): HGBA1C in the last 72 hours. CBG:  Recent Labs Lab 11/30/15 1617  GLUCAP 147*   Lipid Profile: No results for input(s): CHOL, HDL, LDLCALC, TRIG, CHOLHDL, LDLDIRECT in the last 72 hours. Thyroid Function Tests: No results for input(s): TSH, T4TOTAL, FREET4, T3FREE, THYROIDAB in the last 72 hours. Anemia Panel: No results for input(s): VITAMINB12, FOLATE, FERRITIN, TIBC, IRON, RETICCTPCT in the last 72 hours. Urine analysis:    Component Value Date/Time   COLORURINE YELLOW 04/24/2015 1056   APPEARANCEUR CLEAR 04/24/2015 1056   LABSPEC 1.013 04/24/2015 1056   PHURINE 5.0 04/24/2015 1056   GLUCOSEU NEGATIVE 04/24/2015 1056   HGBUR MODERATE* 04/24/2015 1056   BILIRUBINUR NEGATIVE 04/24/2015 1056   KETONESUR NEGATIVE 04/24/2015 1056   PROTEINUR NEGATIVE 04/24/2015 1056   UROBILINOGEN 0.2 04/24/2015 1056   NITRITE NEGATIVE 04/24/2015 1056   LEUKOCYTESUR NEGATIVE 04/24/2015 1056   Sepsis Labs: @LABRCNTIP (procalcitonin:4,lacticidven:4) )No results found for this or any previous visit (from the past 240 hour(s)).   Radiological Exams on Admission: Dg Chest 2  View  12/01/2015  CLINICAL DATA:  Chest pain for couple of hours. EXAM: CHEST  2 VIEW COMPARISON:  08/26/2015 FINDINGS: The heart size and mediastinal contours are within normal limits.  Both lungs are clear. The visualized skeletal structures are unremarkable. IMPRESSION: No active cardiopulmonary disease. Electronically Signed   By: Lucienne Capers M.D.   On: 12/01/2015 01:37   Dg Foot Complete Left  12/01/2015  CLINICAL DATA:  Left foot wound. Diabetes. Second toe nail fell off 5 days ago. EXAM: LEFT FOOT - COMPLETE 3+ VIEW COMPARISON:  11/21/2015 FINDINGS: Previous amputation of the left first toe at the metatarsal-phalangeal joint level. Soft tissues at the site of resection or prominent but unchanged since previous study. No radiopaque soft tissue foreign bodies or gas collections. Vascular calcifications. No bone erosion or sclerosis to suggest osteomyelitis. No acute fracture or dislocation. IMPRESSION: Previous amputation of the left first toe. No evidence of osteomyelitis. Electronically Signed   By: Lucienne Capers M.D.   On: 12/01/2015 00:27     EKG: Not done in ED, will get one.   Assessment/Plan Principal Problem:   Left foot pain Active Problems:   TIA (transient ischemic attack)   DM2 (diabetes mellitus, type 2) (HCC)   HLD (hyperlipidemia)   Essential hypertension   GERD (gastroesophageal reflux disease)   Depression with anxiety   Chest pain   Left foot pain: pt has left foot swelling, tenderness and redness, indicating possible cellulitis in left foot, patient does not have leukocytosis or fever. X-ray did not show osteo. Another potential differential diagnosis is DVT given tenderness over left calf areas. Patient had lower extremity venous Doppler on 6/13 by his PCP, which was negative for DVT.   -will place on tele bed for obs - Empiric antimicrobial treatment with vancomycin and Zosyn per pharmacy - PRN Zofran for nausea, and Percocet for pain - Blood cultures x 2   - ESR and CRP - MRI-left foot to r/o osto - will get Procalcitonin and trend lactic acid levels - IVF: NS 75 cc/h (patient has congestive heart failure, limiting aggressive IV fluids treatment). - LE doppler to r/o DVT and ABI  Chest pain: Chest x-ray is negative for infiltration. Potential differential diagnoses include angina and pulmonary embolism. Initial trop negataive. -EKG -trop x 3 -continue ASA, lipitor and coreg, and prn NTG. - 2d ehco -check D-dimer. If positive-->will get CTA to r/o PE. -prn percoect for pain  TIA: no acute new issues. -continue aspirin, Lipitor  DM-II: Last A1c 7.9 on 11/21/15, not well controled. Patient is taking metformin and glipizide at home -SSI -Check A1c  HLD: Last LDL was 61 on 09/08/15 -Continue home medications: Lipitor  HTN: -Continue Coreg, amlodipine, lisinopril  GERD: -Protonix  Depression with anxiety and bipolar: Stable, no suicidal or homicidal ideations. -Continue home medications: Prozac and Depakote  Chronic diastolic congestive heart failure: 2-D echo 04/03/15 showed EF of 55% with grade 1 diastolic dysfunction. Patient is not taking diuretics at home. No leg edema on the right side, no JVD. CHF is compensated. -Continue aspirin and Coreg -Check BNP   DVT ppx: SQ Lovenox Code Status: Full code Family Communication: None at bed side.  Disposition Plan:  Anticipate discharge back to previous home environment Consults called:  none Admission status: Obs / tele  Date of Service 12/01/2015    Ivor Costa [facility] Pager [phone]  If 7PM-7AM, please contact night-coverage [email or web address] Password [email or web address] 12/01/2015, 2:52 AM

## 2015-12-01 NOTE — Progress Notes (Signed)
[place] responded to consult about patient. Patient was resting when I arrived.The patient seemed to be in good spirits.  I introduced myself to the patient and asked if there were ways that I may support him. He indicated that he was doing well and had no concerns at the time. The patient did not indicate a need for prayer at this time. I will follow up as needed.  Darene Lamer Rosey Eide 10:48 AM

## 2015-12-01 NOTE — Progress Notes (Signed)
Echocardiogram 2D Echocardiogram has been performed.  Joelene Millin 12/01/2015, 10:21 AM

## 2015-12-01 NOTE — ED Provider Notes (Signed)
ED ECG REPORT   Date: 12/01/2015 00:06  Rate: 69  Rhythm: normal sinus rhythm  QRS Axis: normal  Intervals: normal  ST/T Wave abnormalities: early repolarization  Conduction Disutrbances:none  Narrative Interpretation:   Old EKG Reviewed: unchanged  I have personally reviewed the EKG tracing and agree with the computerized printout as noted.   Ripley Fraise, MD 12/01/15 [phone]

## 2015-12-01 NOTE — Progress Notes (Signed)
Pharmacy Antibiotic Note  Shane [name] is a 45 y.o. male admitted on 11/30/2015 with L foot infection.  Pharmacy has been consulted for Vancomycin and Zosyn dosing. Pt on Augmentin since 6/6.  Historical dosing: VT 13 mcg/ml on 750mg  IV q8h (07/2015), SCr 1.02  Plan: Zosyn 3.375gm IV now over 30 min then 3.375gm IV q8h - subsequent doses over 4 hours Vancomycin 1gm IV now then 750mg  IV q8h Will f/u micro data, renal function, and pt's clinical condition Vanc trough prn   Height: 5\' 8"  (172.7 cm) Weight: 176 lb (79.833 kg) IBW/kg (Calculated) : 68.4  Temp (24hrs), Avg:97.7 F (36.5 C), Min:97.7 F (36.5 C), Max:97.7 F (36.5 C)   Recent Labs Lab 11/30/15 1639  WBC 9.0  CREATININE 0.99    Estimated Creatinine Clearance: 90.2 mL/min (by C-G formula based on Cr of 0.99).    Allergies  Allergen Reactions  . Eggs Or Egg-Derived Products Diarrhea  . Other Other (See Comments)    Red meat causes stomach pains, bloating and diarrhea  . Milk-Related Compounds Diarrhea and Other (See Comments)    Any dairy products  - diarrhea and bloating    Antimicrobials this admission: 6/16 Vanc >>  6/16 Zosyn >>   Dose adjustments this admission: n/a  Microbiology results: BCx x2:   Thank you for allowing pharmacy to be a part of this patient's care.  Sherlon Handing, PharmD, BCPS Clinical pharmacist, pager [phone] 12/01/2015 2:38 AM

## 2015-12-02 ENCOUNTER — Encounter (HOSPITAL_COMMUNITY)
Admission: EM | Disposition: A | Discharge status: Home / Self Care | Payer: Self-pay | Source: Home / Self Care | Attending: Internal Medicine

## 2015-12-02 ENCOUNTER — Inpatient Hospital Stay (HOSPITAL_COMMUNITY): Payer: Medicaid Other | Admitting: Anesthesiology

## 2015-12-02 ENCOUNTER — Inpatient Hospital Stay (HOSPITAL_COMMUNITY): Payer: Medicaid Other

## 2015-12-02 ENCOUNTER — Encounter (HOSPITAL_COMMUNITY): Payer: Self-pay | Admitting: Anesthesiology

## 2015-12-02 DIAGNOSIS — M79609 Pain in unspecified limb: Secondary | ICD-10-CM

## 2015-12-02 DIAGNOSIS — L0390 Cellulitis, unspecified: Secondary | ICD-10-CM

## 2015-12-02 DIAGNOSIS — L089 Local infection of the skin and subcutaneous tissue, unspecified: Secondary | ICD-10-CM | POA: Diagnosis present

## 2015-12-02 DIAGNOSIS — L0291 Cutaneous abscess, unspecified: Secondary | ICD-10-CM | POA: Diagnosis present

## 2015-12-02 HISTORY — PX: I & D EXTREMITY: SHX5045

## 2015-12-02 HISTORY — PX: AMPUTATION: SHX166

## 2015-12-02 LAB — GLUCOSE, CAPILLARY
GLUCOSE-CAPILLARY: 176 mg/dL — AB (ref 65–99)
GLUCOSE-CAPILLARY: 155 mg/dL — AB (ref 65–99)
GLUCOSE-CAPILLARY: 162 mg/dL — AB (ref 65–99)
Glucose-Capillary: 170 mg/dL — ABNORMAL HIGH (ref 65–99)
Glucose-Capillary: 190 mg/dL — ABNORMAL HIGH (ref 65–99)

## 2015-12-02 LAB — PREALBUMIN: PREALBUMIN: 34.1 mg/dL (ref 18–38)

## 2015-12-02 SURGERY — IRRIGATION AND DEBRIDEMENT EXTREMITY
Anesthesia: General | Laterality: Left

## 2015-12-02 MED ORDER — VANCOMYCIN HCL 500 MG IV SOLR
INTRAVENOUS | Status: DC | PRN
  Administered 2015-12-02: 500 mg via TOPICAL

## 2015-12-02 MED ORDER — FENTANYL CITRATE (PF) 100 MCG/2ML IJ SOLN: 25.0000 ug | INTRAMUSCULAR | Status: DC | PRN

## 2015-12-02 MED ORDER — LIDOCAINE HCL (CARDIAC) 20 MG/ML IV SOLN
INTRAVENOUS | Status: DC | PRN
  Administered 2015-12-02: 60 mg via INTRAVENOUS

## 2015-12-02 MED ORDER — SUCCINYLCHOLINE CHLORIDE 20 MG/ML IJ SOLN
INTRAMUSCULAR | Status: DC | PRN
  Administered 2015-12-02: 80 mg via INTRAVENOUS

## 2015-12-02 MED ORDER — ACETAMINOPHEN 325 MG PO TABS: 325.0000 mg | ORAL_TABLET | ORAL | Status: DC | PRN

## 2015-12-02 MED ORDER — FENTANYL CITRATE (PF) 100 MCG/2ML IJ SOLN
INTRAMUSCULAR | Status: DC | PRN
  Administered 2015-12-02: 50 ug via INTRAVENOUS

## 2015-12-02 MED ORDER — VANCOMYCIN HCL 500 MG IV SOLR
INTRAVENOUS | Status: AC
  Filled 2015-12-02: qty 500

## 2015-12-02 MED ORDER — SODIUM CHLORIDE 0.9 % IR SOLN
Status: DC | PRN
  Administered 2015-12-02: 3000 mL

## 2015-12-02 MED ORDER — PHENYLEPHRINE HCL 10 MG/ML IJ SOLN
INTRAMUSCULAR | Status: DC | PRN
  Administered 2015-12-02 (×3): 40 ug via INTRAVENOUS
  Administered 2015-12-02: 80 ug via INTRAVENOUS
  Administered 2015-12-02: 40 ug via INTRAVENOUS
  Administered 2015-12-02: 80 ug via INTRAVENOUS

## 2015-12-02 MED ORDER — PROPOFOL 10 MG/ML IV BOLUS
INTRAVENOUS | Status: DC | PRN
  Administered 2015-12-02: 140 mg via INTRAVENOUS

## 2015-12-02 MED ORDER — ONDANSETRON HCL 4 MG PO TABS: 4.0000 mg | ORAL_TABLET | Freq: Four times a day (QID) | ORAL | Status: DC | PRN

## 2015-12-02 MED ORDER — ONDANSETRON HCL 4 MG/2ML IJ SOLN
4.0000 mg | Freq: Four times a day (QID) | INTRAMUSCULAR | Status: DC | PRN
  Administered 2015-12-03: 4 mg via INTRAVENOUS
  Filled 2015-12-02: qty 2

## 2015-12-02 MED ORDER — METRONIDAZOLE 500 MG PO TABS
500.0000 mg | ORAL_TABLET | Freq: Three times a day (TID) | ORAL | Status: DC
  Administered 2015-12-02 – 2015-12-04 (×7): 500 mg via ORAL
  Filled 2015-12-02 (×8): qty 1

## 2015-12-02 MED ORDER — DEXTROSE 5 % IV SOLN
2.0000 g | INTRAVENOUS | Status: DC
  Administered 2015-12-02 – 2015-12-04 (×3): 2 g via INTRAVENOUS
  Filled 2015-12-02 (×3): qty 2

## 2015-12-02 MED ORDER — METOCLOPRAMIDE HCL 5 MG PO TABS: 5.0000 mg | ORAL_TABLET | Freq: Three times a day (TID) | ORAL | Status: DC | PRN

## 2015-12-02 MED ORDER — MIDAZOLAM HCL 2 MG/2ML IJ SOLN
INTRAMUSCULAR | Status: AC
  Filled 2015-12-02: qty 2

## 2015-12-02 MED ORDER — LIDOCAINE HCL 4 % EX SOLN
CUTANEOUS | Status: DC | PRN
  Administered 2015-12-02: 2 mL via TOPICAL

## 2015-12-02 MED ORDER — ACETAMINOPHEN 650 MG RE SUPP: 650.0000 mg | Freq: Four times a day (QID) | RECTAL | Status: DC | PRN

## 2015-12-02 MED ORDER — LIDOCAINE 2% (20 MG/ML) 5 ML SYRINGE
INTRAMUSCULAR | Status: AC
  Filled 2015-12-02: qty 10

## 2015-12-02 MED ORDER — ACETAMINOPHEN 325 MG PO TABS
650.0000 mg | ORAL_TABLET | Freq: Four times a day (QID) | ORAL | Status: DC | PRN
  Administered 2015-12-03 – 2015-12-04 (×3): 650 mg via ORAL
  Filled 2015-12-02 (×3): qty 2

## 2015-12-02 MED ORDER — ONDANSETRON HCL 4 MG/2ML IJ SOLN
INTRAMUSCULAR | Status: AC
  Filled 2015-12-02: qty 2

## 2015-12-02 MED ORDER — MIDAZOLAM HCL 5 MG/5ML IJ SOLN
INTRAMUSCULAR | Status: DC | PRN
  Administered 2015-12-02: 2 mg via INTRAVENOUS

## 2015-12-02 MED ORDER — ROCURONIUM BROMIDE 50 MG/5ML IV SOLN
INTRAVENOUS | Status: AC
  Filled 2015-12-02: qty 1

## 2015-12-02 MED ORDER — EPHEDRINE 5 MG/ML INJ
INTRAVENOUS | Status: AC
  Filled 2015-12-02: qty 10

## 2015-12-02 MED ORDER — PHENYLEPHRINE 40 MCG/ML (10ML) SYRINGE FOR IV PUSH (FOR BLOOD PRESSURE SUPPORT)
PREFILLED_SYRINGE | INTRAVENOUS | Status: AC
  Filled 2015-12-02: qty 40

## 2015-12-02 MED ORDER — OXYCODONE HCL 5 MG/5ML PO SOLN: 5.0000 mg | Freq: Once | ORAL | Status: DC | PRN

## 2015-12-02 MED ORDER — ENOXAPARIN SODIUM 30 MG/0.3ML ~~LOC~~ SOLN
30.0000 mg | SUBCUTANEOUS | Status: DC
  Administered 2015-12-04: 30 mg via SUBCUTANEOUS
  Filled 2015-12-02 (×2): qty 0.3

## 2015-12-02 MED ORDER — ACETAMINOPHEN 160 MG/5ML PO SOLN: 325.0000 mg | ORAL | Status: DC | PRN

## 2015-12-02 MED ORDER — SUCCINYLCHOLINE CHLORIDE 200 MG/10ML IV SOSY
PREFILLED_SYRINGE | INTRAVENOUS | Status: AC
  Filled 2015-12-02: qty 10

## 2015-12-02 MED ORDER — FENTANYL CITRATE (PF) 250 MCG/5ML IJ SOLN
INTRAMUSCULAR | Status: AC
  Filled 2015-12-02: qty 5

## 2015-12-02 MED ORDER — METOCLOPRAMIDE HCL 5 MG/ML IJ SOLN
5.0000 mg | Freq: Three times a day (TID) | INTRAMUSCULAR | Status: DC | PRN
  Administered 2015-12-04: 10 mg via INTRAVENOUS
  Filled 2015-12-02: qty 2

## 2015-12-02 MED ORDER — ONDANSETRON HCL 4 MG/2ML IJ SOLN
INTRAMUSCULAR | Status: DC | PRN
  Administered 2015-12-02: 4 mg via INTRAVENOUS

## 2015-12-02 MED ORDER — OXYCODONE HCL 5 MG PO TABS: 5.0000 mg | ORAL_TABLET | Freq: Once | ORAL | Status: DC | PRN

## 2015-12-02 MED ORDER — PROPOFOL 10 MG/ML IV BOLUS
INTRAVENOUS | Status: AC
  Filled 2015-12-02: qty 20

## 2015-12-02 MED ORDER — VANCOMYCIN HCL 1000 MG IV SOLR
INTRAVENOUS | Status: AC
  Filled 2015-12-02: qty 1000

## 2015-12-02 MED ORDER — GLUCERNA SHAKE PO LIQD
237.0000 mL | Freq: Two times a day (BID) | ORAL | Status: DC
  Administered 2015-12-04 (×2): 237 mL via ORAL

## 2015-12-02 MED ORDER — EPHEDRINE SULFATE 50 MG/ML IJ SOLN
INTRAMUSCULAR | Status: DC | PRN
  Administered 2015-12-02 (×2): 10 mg via INTRAVENOUS

## 2015-12-02 SURGICAL SUPPLY — 49 items
BANDAGE ACE 4X5 VEL STRL LF (GAUZE/BANDAGES/DRESSINGS) ×3 IMPLANT
BANDAGE ACE 6X5 VEL STRL LF (GAUZE/BANDAGES/DRESSINGS) ×3 IMPLANT
BANDAGE ELASTIC 4 VELCRO ST LF (GAUZE/BANDAGES/DRESSINGS) IMPLANT
BNDG COHESIVE 4X5 TAN STRL (GAUZE/BANDAGES/DRESSINGS) IMPLANT
BNDG GAUZE ELAST 4 BULKY (GAUZE/BANDAGES/DRESSINGS) ×3 IMPLANT
COVER SURGICAL LIGHT HANDLE (MISCELLANEOUS) ×3 IMPLANT
DRAIN PENROSE 1/4X12 LTX STRL (WOUND CARE) ×3 IMPLANT
DRAPE U-SHAPE 47X51 STRL (DRAPES) ×6 IMPLANT
DRSG PAD ABDOMINAL 8X10 ST (GAUZE/BANDAGES/DRESSINGS) ×3 IMPLANT
ELECT REM PT RETURN 9FT ADLT (ELECTROSURGICAL)
ELECTRODE REM PT RTRN 9FT ADLT (ELECTROSURGICAL) IMPLANT
FACESHIELD WRAPAROUND (MASK) ×3 IMPLANT
GAUZE XEROFORM 1X8 LF (GAUZE/BANDAGES/DRESSINGS) ×3 IMPLANT
GLOVE BIOGEL PI IND STRL 7.5 (GLOVE) ×1 IMPLANT
GLOVE BIOGEL PI IND STRL 8 (GLOVE) ×1 IMPLANT
GLOVE BIOGEL PI INDICATOR 7.5 (GLOVE) ×2
GLOVE BIOGEL PI INDICATOR 8 (GLOVE) ×2
GLOVE SURG ORTHO 8.0 STRL STRW (GLOVE) ×3 IMPLANT
GLOVE SURG SS PI 7.0 STRL IVOR (GLOVE) ×3 IMPLANT
GOWN STRL REUS W/ TWL LRG LVL3 (GOWN DISPOSABLE) ×2 IMPLANT
GOWN STRL REUS W/ TWL XL LVL3 (GOWN DISPOSABLE) ×1 IMPLANT
GOWN STRL REUS W/TWL LRG LVL3 (GOWN DISPOSABLE) ×4
GOWN STRL REUS W/TWL XL LVL3 (GOWN DISPOSABLE) ×2
KIT BASIN OR (CUSTOM PROCEDURE TRAY) ×3 IMPLANT
KIT ROOM TURNOVER OR (KITS) ×3 IMPLANT
MANIFOLD NEPTUNE II (INSTRUMENTS) ×3 IMPLANT
NS IRRIG 1000ML POUR BTL (IV SOLUTION) ×3 IMPLANT
PACK ORTHO EXTREMITY (CUSTOM PROCEDURE TRAY) ×3 IMPLANT
PAD ARMBOARD 7.5X6 YLW CONV (MISCELLANEOUS) ×6 IMPLANT
PAD CAST 4YDX4 CTTN HI CHSV (CAST SUPPLIES) ×1 IMPLANT
PADDING CAST COTTON 4X4 STRL (CAST SUPPLIES) ×2
SPONGE GAUZE 4X4 12PLY STER LF (GAUZE/BANDAGES/DRESSINGS) ×3 IMPLANT
SPONGE LAP 18X18 X RAY DECT (DISPOSABLE) ×9 IMPLANT
SPONGE LAP 4X18 X RAY DECT (DISPOSABLE) ×3 IMPLANT
STOCKINETTE IMPERVIOUS 9X36 MD (GAUZE/BANDAGES/DRESSINGS) ×3 IMPLANT
SUT ETHILON 2 0 FS 18 (SUTURE) IMPLANT
SUT ETHILON 3 0 PS 1 (SUTURE) ×6 IMPLANT
SUT ETHILON 4 0 PS 2 18 (SUTURE) IMPLANT
SUT PROLENE 3 0 PS 2 (SUTURE) IMPLANT
SUT VIC AB 3-0 SH 27 (SUTURE) ×2
SUT VIC AB 3-0 SH 27X BRD (SUTURE) ×1 IMPLANT
TOWEL OR 17X24 6PK STRL BLUE (TOWEL DISPOSABLE) ×3 IMPLANT
TOWEL OR 17X26 10 PK STRL BLUE (TOWEL DISPOSABLE) ×3 IMPLANT
TUBE ANAEROBIC SPECIMEN COL (MISCELLANEOUS) IMPLANT
TUBE CONNECTING 12'X1/4 (SUCTIONS) ×1
TUBE CONNECTING 12X1/4 (SUCTIONS) ×2 IMPLANT
UNDERPAD 30X30 INCONTINENT (UNDERPADS AND DIAPERS) ×3 IMPLANT
WATER STERILE IRR 1000ML POUR (IV SOLUTION) ×3 IMPLANT
YANKAUER SUCT BULB TIP NO VENT (SUCTIONS) ×3 IMPLANT

## 2015-12-02 NOTE — Progress Notes (Signed)
*  PRELIMINARY RESULTS* Vascular Ultrasound Left lower extremity venous duplex has been completed.  Preliminary findings: No evidence of DVT or baker's cyst. Unchanged from 11/29/15.   ABI completed: within normal limits.    RIGHT    LEFT    PRESSURE WAVEFORM  PRESSURE WAVEFORM  BRACHIAL 175 Tri BRACHIAL 160 Tri  DP   DP    AT 192 Tri AT 184 Tri  PT 215 Tri PT 202 Tri  PER   PER    GREAT TOE  NA GREAT TOE  NA    RIGHT LEFT  ABI 1.23 1.15     Landry Mellow, RDMS, RVT  12/02/2015, 9:42 AM

## 2015-12-02 NOTE — Anesthesia Postprocedure Evaluation (Signed)
Anesthesia Post Note  Patient: Shane [name]  Procedure(s) Performed: Procedure(s) (LRB): IRRIGATION AND DEBRIDEMENT OF FOOT; LEFT SECOND TOE AMPUTATION (Left)  Patient location during evaluation: PACU Anesthesia Type: General Level of consciousness: awake Pain management: pain level controlled Vital Signs Assessment: post-procedure vital signs reviewed and stable Respiratory status: spontaneous breathing Cardiovascular status: stable Postop Assessment: no signs of nausea or vomiting Anesthetic complications: no    Last Vitals:  Filed Vitals:   12/02/15 1615 12/02/15 1630  BP: 158/89 175/94  Pulse: 73 76  Temp: 36.4 C 36.4 C  Resp: 11 10    Last Pain:  Filed Vitals:   12/02/15 1630  PainSc: 9                  Severiano Utsey

## 2015-12-02 NOTE — Consult Note (Signed)
Reason for Consult: Left foot infection Referring Physician: Dr. Tyler Aas Shane [name] is an 45 y.o. male.  HPI: Shane [name] is a 45 year old patient who underwent left great toe amputation earlier this year.  He is currently hospitalized with elevated white count and fevers which has not responded to antibiotic treatment.  MRI scan of the left foot demonstrates dorsal abscess.  No definite osteomyelitis particularly of the second toe.  He presents now for surgical consultation.  He last had Lovenox yesterday at 2 o'clock p.m.  He last ate this morning.  Past Medical History  Diagnosis Date  . Hypertension     a. 08/2014 Admitted with hypertensive urgency.  . Chest pain     a. 2015 Reportedly normal stress test in [place].  Marland Kitchen TIA (transient ischemic attack) 08/2014; 03/2015    a. 08/2014 in setting of hypertensive urgency.  . Anemia   . Refusal of blood transfusions as patient is Jehov[employer]'s Witness   . Hyperlipidemia   . Chronic diastolic CHF (congestive heart failure) (HCC)     a.03/2015 Echo: EF 55-60%, Gr 1 DD, mild MR, triv PR.  . Type II diabetes mellitus ([place])   . Sleep apnea   . GERD (gastroesophageal reflux disease)   . Anxiety   . Depression   . Bipolar disorder [facility])     Past Surgical History  Procedure Laterality Date  . Inguinal hernia repair Bilateral ~ 1983- ~ 1986  . Tonsillectomy  ~ 1985  . Circumcision    . Amputation Left 08/03/2015    Procedure: AMPUTATION LEFT GREAT TOE;  Surgeon: Newt Minion, MD;  Location: [place];  Service: Orthopedics;  Laterality: Left;    Family History  Problem Relation Age of Onset  . Hypertension Mother   . Diabetes Mother   . Hyperlipidemia Mother   . Heart disease Mother     s/p pacemaker  . Diabetes Father   . Hypertension Father   . Stroke Father   . Heart attack Father     first MI @ 70.  . Stroke Brother     Social History:  reports that he has never smoked. He has never used smokeless tobacco. He reports that he does not drink  alcohol or use illicit drugs.  Allergies:  Allergies  Allergen Reactions  . Eggs Or Egg-Derived Products Diarrhea  . Other Other (See Comments)    Red meat causes stomach pains, bloating and diarrhea  . Milk-Related Compounds Diarrhea and Other (See Comments)    Any dairy products  - diarrhea and bloating    Medications: I have reviewed the patient's current medications.  Results for orders placed or performed during the hospital encounter of 11/30/15 (from the past 48 hour(s))  POC CBG, ED     Status: Abnormal   Collection Time: 11/30/15  4:17 PM  Result Value Ref Range   Glucose-Capillary 147 (H) 65 - 99 mg/dL  CBC with Differential     Status: Abnormal   Collection Time: 11/30/15  4:39 PM  Result Value Ref Range   WBC 9.0 4.0 - 10.5 K/uL   RBC 3.48 (L) 4.22 - 5.81 MIL/uL   Hemoglobin 10.3 (L) 13.0 - 17.0 g/dL   HCT 32.1 (L) 39.0 - 52.0 %   MCV 92.2 78.0 - 100.0 fL   MCH 29.6 26.0 - 34.0 pg   MCHC 32.1 30.0 - 36.0 g/dL   RDW 13.0 11.5 - 15.5 %   Platelets 206 150 - 400 K/uL   Neutrophils  Relative % 70 %   Neutro Abs 6.4 1.7 - 7.7 K/uL   Lymphocytes Relative 22 %   Lymphs Abs 2.0 0.7 - 4.0 K/uL   Monocytes Relative 5 %   Monocytes Absolute 0.4 0.1 - 1.0 K/uL   Eosinophils Relative 3 %   Eosinophils Absolute 0.3 0.0 - 0.7 K/uL   Basophils Relative 0 %   Basophils Absolute 0.0 0.0 - 0.1 K/uL  Basic metabolic panel     Status: Abnormal   Collection Time: 11/30/15  4:39 PM  Result Value Ref Range   Sodium 142 135 - 145 mmol/L   Potassium 4.7 3.5 - 5.1 mmol/L   Chloride 109 101 - 111 mmol/L   CO2 29 22 - 32 mmol/L   Glucose, Bld 136 (H) 65 - 99 mg/dL   BUN 16 6 - 20 mg/dL   Creatinine, Ser 0.99 0.61 - 1.24 mg/dL   Calcium 9.3 8.9 - 10.3 mg/dL   GFR calc non Af Amer >60 >60 mL/min   GFR calc Af Amer >60 >60 mL/min    Comment: (NOTE) The eGFR has been calculated using the CKD EPI equation. This calculation has not been validated in all clinical situations. eGFR's  persistently <60 mL/min signify possible Chronic Kidney Disease.    Anion gap 4 (L) 5 - 15  I-stat troponin, ED     Status: None   Collection Time: 12/01/15  1:27 AM  Result Value Ref Range   Troponin i, poc 0.00 0.00 - 0.08 ng/mL   Comment 3            Comment: Due to the release kinetics of cTnI, a negative result within the first hours of the onset of symptoms does not rule out myocardial infarction with certainty. If myocardial infarction is still suspected, repeat the test at appropriate intervals.   D-dimer, quantitative (not at [facility])     Status: None   Collection Time: 12/01/15  4:31 AM  Result Value Ref Range   D-Dimer, Quant 0.32 0.00 - 0.50 ug/mL-FEU    Comment: (NOTE) At the manufacturer cut-off of 0.50 ug/mL FEU, this assay has been documented to exclude PE with a sensitivity and negative predictive value of 97 to 99%.  At this time, this assay has not been approved by the FDA to exclude DVT/VTE. Results should be correlated with clinical presentation.   Troponin I (q 6hr x 3)     Status: None   Collection Time: 12/01/15  4:31 AM  Result Value Ref Range   Troponin I <0.03 <0.031 ng/mL    Comment:        NO INDICATION OF MYOCARDIAL INJURY.   C-reactive protein     Status: None   Collection Time: 12/01/15  4:31 AM  Result Value Ref Range   CRP <0.5 <1.0 mg/dL  Sedimentation rate     Status: None   Collection Time: 12/01/15  4:31 AM  Result Value Ref Range   Sed Rate 13 0 - 16 mm/hr  Basic metabolic panel     Status: Abnormal   Collection Time: 12/01/15  4:31 AM  Result Value Ref Range   Sodium 138 135 - 145 mmol/L   Potassium 4.3 3.5 - 5.1 mmol/L   Chloride 107 101 - 111 mmol/L   CO2 27 22 - 32 mmol/L   Glucose, Bld 139 (H) 65 - 99 mg/dL   BUN 17 6 - 20 mg/dL   Creatinine, Ser 0.86 0.61 - 1.24 mg/dL   Calcium  9.2 8.9 - 10.3 mg/dL   GFR calc non Af Amer >60 >60 mL/min   GFR calc Af Amer >60 >60 mL/min    Comment: (NOTE) The eGFR has been calculated  using the CKD EPI equation. This calculation has not been validated in all clinical situations. eGFR's persistently <60 mL/min signify possible Chronic Kidney Disease.    Anion gap 4 (L) 5 - 15  CBC     Status: Abnormal   Collection Time: 12/01/15  4:31 AM  Result Value Ref Range   WBC 7.9 4.0 - 10.5 K/uL   RBC 3.52 (L) 4.22 - 5.81 MIL/uL   Hemoglobin 10.2 (L) 13.0 - 17.0 g/dL   HCT 32.3 (L) 39.0 - 52.0 %   MCV 91.8 78.0 - 100.0 fL   MCH 29.0 26.0 - 34.0 pg   MCHC 31.6 30.0 - 36.0 g/dL   RDW 13.0 11.5 - 15.5 %   Platelets 205 150 - 400 K/uL  Procalcitonin     Status: None   Collection Time: 12/01/15  4:31 AM  Result Value Ref Range   Procalcitonin <0.10 ng/mL    Comment:        Interpretation: PCT (Procalcitonin) <= 0.5 ng/mL: Systemic infection (sepsis) is not likely. Local bacterial infection is possible. REPEATED TO VERIFY (NOTE)         ICU PCT Algorithm               Non ICU PCT Algorithm    ----------------------------     ------------------------------         PCT < 0.25 ng/mL                 PCT < 0.1 ng/mL     Stopping of antibiotics            Stopping of antibiotics       strongly encouraged.               strongly encouraged.    ----------------------------     ------------------------------       PCT level decrease by               PCT < 0.25 ng/mL       >= 80% from peak PCT       OR PCT 0.25 - 0.5 ng/mL          Stopping of antibiotics                                             encouraged.     Stopping of antibiotics           encouraged.    ----------------------------     ------------------------------       PCT level decrease by              PCT >= 0.25 ng/mL       < 80% from peak PCT        AND PCT >= 0.5 ng/mL             Continuing antibiotics                                              encouraged.       Continuing antibiotics  encouraged.    ----------------------------     ------------------------------     PCT level increase  compared          PCT > 0.5 ng/mL         with peak PCT AND          PCT >= 0.5 ng/mL             Escalation of antibiotics                                          strongly encouraged.      Escalation of antibiotics        strongly encouraged.   Protime-INR     Status: None   Collection Time: 12/01/15  4:31 AM  Result Value Ref Range   Prothrombin Time 14.1 11.6 - 15.2 seconds   INR 1.07 0.00 - 1.49  APTT     Status: None   Collection Time: 12/01/15  4:31 AM  Result Value Ref Range   aPTT 29 24 - 37 seconds  HIV antibody     Status: None   Collection Time: 12/01/15  4:31 AM  Result Value Ref Range   HIV Screen 4th Generation wRfx Non Reactive Non Reactive    Comment: (NOTE) Performed At: [facility] [address] [place], [place] [id number] Lindon Romp MD [id number]   Culture, blood (x 2)     Status: None (Preliminary result)   Collection Time: 12/01/15  4:45 AM  Result Value Ref Range   Specimen Description BLOOD RIGHT ARM    Special Requests IN PEDIATRIC BOTTLE 3CC    Culture PENDING    Report Status PENDING   Lactic acid, plasma     Status: None   Collection Time: 12/01/15  4:57 AM  Result Value Ref Range   Lactic Acid, Venous 0.7 0.5 - 2.0 mmol/L  Glucose, capillary     Status: Abnormal   Collection Time: 12/01/15  7:58 AM  Result Value Ref Range   Glucose-Capillary 119 (H) 65 - 99 mg/dL   Comment 1 Notify RN   Lactic acid, plasma     Status: None   Collection Time: 12/01/15  8:01 AM  Result Value Ref Range   Lactic Acid, Venous 0.7 0.5 - 2.0 mmol/L  Troponin I (q 6hr x 3)     Status: None   Collection Time: 12/01/15  8:01 AM  Result Value Ref Range   Troponin I <0.03 <0.031 ng/mL    Comment:        NO INDICATION OF MYOCARDIAL INJURY.   Brain natriuretic peptide     Status: None   Collection Time: 12/01/15  8:01 AM  Result Value Ref Range   B Natriuretic Peptide 18.2 0.0 - 100.0 pg/mL  Glucose, capillary     Status: Abnormal    Collection Time: 12/01/15 11:21 AM  Result Value Ref Range   Glucose-Capillary 156 (H) 65 - 99 mg/dL   Comment 1 Notify RN   Troponin I (q 6hr x 3)     Status: None   Collection Time: 12/01/15  2:23 PM  Result Value Ref Range   Troponin I <0.03 <0.031 ng/mL    Comment:        NO INDICATION OF MYOCARDIAL INJURY.   Glucose, capillary     Status: Abnormal   Collection Time: 12/01/15  4:17 PM  Result Value Ref Range   Glucose-Capillary 294 (H) 65 - 99 mg/dL   Comment 1 Notify RN   Glucose, capillary     Status: Abnormal   Collection Time: 12/01/15 10:19 PM  Result Value Ref Range   Glucose-Capillary 152 (H) 65 - 99 mg/dL   Comment 1 Notify RN    Comment 2 Document in Chart   Glucose, capillary     Status: Abnormal   Collection Time: 12/02/15  6:34 AM  Result Value Ref Range   Glucose-Capillary 176 (H) 65 - 99 mg/dL  Glucose, capillary     Status: Abnormal   Collection Time: 12/02/15 11:17 AM  Result Value Ref Range   Glucose-Capillary 170 (H) 65 - 99 mg/dL   Comment 1 Notify RN   Prealbumin     Status: None   Collection Time: 12/02/15 11:53 AM  Result Value Ref Range   Prealbumin 34.1 18 - 38 mg/dL    Dg Chest 2 View  12/01/2015  CLINICAL DATA:  Chest pain for couple of hours. EXAM: CHEST  2 VIEW COMPARISON:  08/26/2015 FINDINGS: The heart size and mediastinal contours are within normal limits. Both lungs are clear. The visualized skeletal structures are unremarkable. IMPRESSION: No active cardiopulmonary disease. Electronically Signed   By: Lucienne Capers M.D.   On: 12/01/2015 01:37   Mr Foot Left Wo Contrast  12/02/2015  CLINICAL DATA:  Left foot pain and left second toe swelling for 2 weeks. Fever and chills. Question abscess or osteomyelitis. EXAM: MRI OF THE LEFT FOREFOOT WITHOUT CONTRAST TECHNIQUE: Multiplanar, multisequence MR imaging was performed. No intravenous contrast was administered. COMPARISON:  Plain films left foot 11/30/2015. MRI left foot 08/02/2015.  FINDINGS: Amputation of the great toe is identified as seen on comparison plain films. Marked soft tissue edema is seen over the dorsum of the foot. A large area of heterogeneous signal abnormality is seen in the subcutaneous soft tissues over the dorsum of the foot. This area measures up to 4.4 cm transverse at the level of the distal metatarsals by 1.8 cm craniocaudal by 5.6 cm long. Smaller areas of similar signal abnormality are seen extending to the level of the mid foot. The coronal STIR sequence demonstrates mild appearing edema distal phalanx of the second toe but this cannot be confirmed on other sequences. Bone marrow signal is otherwise unremarkable. IMPRESSION: Intense edema over the dorsum of the foot is compatible with cellulitis. Heterogeneous areas of signal abnormality in the dorsum of the foot is worrisome for abscess. Mildly increased T2 signal in the coronal STIR sequence in the distal phalanx of the second toe cannot be confirmed on other sequences. No convincing evidence of osteomyelitis is identified. Electronically Signed   By: Inge Rise M.D.   On: 12/02/2015 08:43   Dg Foot Complete Left  12/01/2015  CLINICAL DATA:  Left foot wound. Diabetes. Second toe nail fell off 5 days ago. EXAM: LEFT FOOT - COMPLETE 3+ VIEW COMPARISON:  11/21/2015 FINDINGS: Previous amputation of the left first toe at the metatarsal-phalangeal joint level. Soft tissues at the site of resection or prominent but unchanged since previous study. No radiopaque soft tissue foreign bodies or gas collections. Vascular calcifications. No bone erosion or sclerosis to suggest osteomyelitis. No acute fracture or dislocation. IMPRESSION: Previous amputation of the left first toe. No evidence of osteomyelitis. Electronically Signed   By: Lucienne Capers M.D.   On: 12/01/2015 00:27    Review of Systems  Constitutional: Positive for fever.  HENT:  Negative.   Eyes: Negative.   Respiratory: Negative.   Cardiovascular:  Negative.   Gastrointestinal: Negative.   Genitourinary: Negative.   Musculoskeletal: Positive for joint pain.  Skin: Negative.   Neurological: Negative.   Endo/Heme/Allergies: Negative.   Psychiatric/Behavioral: Negative.    Blood pressure 165/87, pulse 77, temperature 98 F (36.7 C), temperature source Oral, resp. rate 18, height 5' 8"  (1.727 m), weight 77.1 kg (169 lb 15.6 oz), SpO2 100 %. Physical Exam  Constitutional: He appears well-developed.  HENT:  Head: Normocephalic.  Eyes: Pupils are equal, round, and reactive to light.  Neck: Normal range of motion.  Cardiovascular: Normal rate.   Respiratory: Effort normal.  Neurological: He is alert.  Skin: Skin is warm.  Psychiatric: He has a normal mood and affect.   left foot is examined.  He has well-healed surgical incision from the left great tendon amputation.  Has dorsal fluctuance but no tissue crepitus above the ankle.  The area that is in question is about 4 x 4 centimeters.  Left second toenail is off that I'll detect any sinus tracts going down to the first phalanx.  Sensation is diminished on the medial side of the foot intact on the plantar surface.  Assessment/Plan: Impression is left foot dorsal abscess without evidence of osteomyelitis plan patient needs to have this drained.  We'll plan to do that today.  We'll make the patient nothing by mouth.  Hold Lovenox.  All QUESTIONS answered.  DEAN,GREGORY SCOTT 12/02/2015, 12:54 PM

## 2015-12-02 NOTE — Progress Notes (Signed)
Initial Nutrition Assessment  DOCUMENTATION CODES:   Not applicable  INTERVENTION:  Once diet advances, provide Glucerna Shake po BID, each supplement provides 220 kcal and 10 grams of protein.  RD to continue to monitor for needs.  NUTRITION DIAGNOSIS:   Increased nutrient needs related to wound healing as evidenced by estimated needs.  GOAL:   Patient will meet greater than or equal to 90% of their needs  MONITOR:   Diet advancement, Supplement acceptance, Weight trends, Labs, I & O's, Skin  REASON FOR ASSESSMENT:   Consult Wound healing  ASSESSMENT:   45 y.o. male with medical history significant of hypertension, hyperlipidemia, diabetes mellitus, GERD, depression, anxiety, TIA, diastolic dysfunction, OSA not on CPAP, bipolar disorder, lower GI bleeding, who presents with left foot swelling and pain, and chest pain. Per MD note, left foot dorsal abscess without evidence of osteomyelitis plan patient needs to have this drained.  Pt is currently NPO for procedure today. Pt is undergoing I&D on left foot. Pt is OR during attempted time of visit. RD unable to obtain nutrition history. Previous meal completion prior to NPO status has been 100%. Weight stable per Epic weight records. RD consulted for wound healing. RD to order nutritional supplements. Nursing staff to provide once diet advances.   Unable to complete Nutrition-Focused physical exam at this time.   Labs and medications reviewed.   Diet Order:  Diet NPO time specified  Skin:   (Incision L foot)  Last BM:  6/15  Height:   Ht Readings from Last 1 Encounters:  12/01/15 5\' 8"  (1.727 m)    Weight:   Wt Readings from Last 1 Encounters:  12/01/15 169 lb 15.6 oz (77.1 kg)    Ideal Body Weight:  70 kg  BMI:  Body mass index is 25.85 kg/(m^2).  Estimated Nutritional Needs:   Kcal:  2050-2250  Protein:  100-110 grams  Fluid:  2- 2.2 L/day  EDUCATION NEEDS:   No education needs identified at this  time  Corrin Parker, MS, RD, LDN Pager # [phone] After hours/ weekend pager # [phone]

## 2015-12-02 NOTE — Progress Notes (Signed)
Patient has second toe and toenail off.  The second toe is numb.  This is essentially the clinical scenario which occurred prior to his first toe amputation on the left foot earlier this year.  He even know the MRI scan doesn't show definite evidence of osteomyelitis distally I think because of that clinical history as well as the appearance of the toe itself it would be reasonable to proceed with partial second toe amputation just to eradicate that as a possible source of injury for further infection.  Patient actually desires this surgical intervention to potentially prevent further issues with  His toes.  I think it's reasonable based on clinical history of the first toe as well as the clinical appearance of the second toe at this time with neuropathic changes and loss of the toenail itself.  This wast an atraumatic loss of the toenail.

## 2015-12-02 NOTE — Progress Notes (Signed)
Inpatient Diabetes Program Recommendations  AACE/ADA: New Consensus Statement on Inpatient Glycemic Control (2015)  Target Ranges:  Prepandial:   less than 140 mg/dL      Peak postprandial:   less than 180 mg/dL (1-2 hours)      Critically ill patients:  140 - 180 mg/dL   Lab Results  Component Value Date   GLUCAP 170* 12/02/2015   HGBA1C 7.9 11/21/2015    Review of Glycemic Control  Inpatient Diabetes Program Recommendations:  Insulin - Basal: consider adding low dose basal Lantus or Levemir 10 units  Thank you  Raoul Pitch MSN, RN,CDE Inpatient Diabetes Coordinator [phone] (team pager)

## 2015-12-02 NOTE — Progress Notes (Addendum)
Progress Note    Shane [name]  BSW:[id number] DOB: 1970-06-29  DOA: 11/30/2015 PCP: Minerva Ends, MD    Brief Narrative:   Shane [name] is an 45 y.o. male with medical history significant of hypertension, hyperlipidemia, diabetes mellitus, GERD, depression, anxiety, TIA, diastolic dysfunction, OSA not on CPAP, bipolar disorder, lower GI bleeding, Who was admitted 11/30/15 with chief complaint of left foot swelling and pain as well as chest pain. Being treated for left foot cellulitis.   Assessment/Plan:   Principal problems:  Left foot cellulitis/abscess with pain X-ray did not show osteo. Patient had lower extremity venous Doppler on 6/13 by his PCP, which was negative for DVT. D-dimer not elevated, so doubt DVT. Follow-up blood cultures. ESR/CRP not elevated. MRI of the left foot showed possible abscess, but no convincing evidence for osteomyelitis.  Will ask Dr. Sharol Given to evaluate for surgical drainage of abscess. D/C Zosyn and start Rocephin/Flagyl. Doubt at risk for Pseudomonas.  Chest pain Chest x-ray is negative for infiltration. Troponins negative 3. EKG negative for ischemic changes. Continue ASA, lipitor and coreg, and prn NTG. 2-D echocardiogram Shows EF 55-60 percent with no regional wall motion abnormalities or evidence of diastolic dysfunction. Pain is very atypical and that it is brought on and relieved by certain movements. No further workup necessary at this time. Treat symptomatically.  Active problems:  History of TIA Continue aspirin, Lipitor.  DM-II Last A1c 7.9 on 11/21/15, not well controled. Patient is taking metformin and glipizide at home. Currently being managed with insulin sensitive SSI. CBGs [id number].  HLD Last LDL was 61 on 09/08/15. Continue home medications: Lipitor.  HTN: Continue Coreg, amlodipine, lisinopril.  GERD Continue Protonix.  Depression with anxiety and bipolar Stable, no suicidal or homicidal ideations. Continue home  medications: Prozac and Depakote.  Chronic diastolic congestive heart failure 2-D echo 04/03/15 showed EF of 55% with grade 1 diastolic dysfunction. Patient is not taking diuretics at home. Continue aspirin and Coreg. Chest x-ray clear.   Family Communication/Anticipated D/C date and plan/Code Status   DVT prophylaxis: Lovenox ordered. Code Status: Full Code.  Family Communication: No family currently at the bedside. Disposition Plan: Home 2-3 days if medically stable and depending on orthopedic input.   Medical Consultants:    Orthopedic Surgery.   Procedures:   2 D Echo 12/01/15:   Study Conclusions  - Left ventricle: The cavity size was mildly dilated. Systolic  function was normal. The estimated ejection fraction was in the  range of 55% to 60%. Wall motion was normal; there were no  regional wall motion abnormalities. Left ventricular diastolic  function parameters were normal.  Anti-Infectives:   Vancomycin 12/01/15---> Zosyn 12/01/15--->   Subjective:   Liston Thum continues to report some chest pain and had some nausea/vomiting last night. Bowels moved yesterday.  No dyspnea.  Objective:    Filed Vitals:   12/01/15 1515 12/01/15 2101 12/02/15 0508 12/02/15 0845  BP: 137/80 124/97 153/88 130/89  Pulse: 78 68 70 72  Temp: 97.7 F (36.5 C) 97.6 F (36.4 C) 98 F (36.7 C)   TempSrc: Oral Oral Oral   Resp: _0 Height:      Weight:      SpO2: 9% 100% 100% 100%    Intake/Output Summary (Last 24 hours) at 12/02/15 0915 Last data filed at 12/02/15 0635  Gross per 24 hour  Intake 1371.25 ml  Output   1400 ml  Net -28.75 ml   [employer]  11/30/15 1614 12/01/15 0434  Weight: 79.833 kg (176 lb) 77.1 kg (169 lb 15.6 oz)    Exam: General exam: Appears calm and comfortable.  Respiratory system: Clear to auscultation. Respiratory effort normal. Cardiovascular system: S1 & S2 heard, RRR. No JVD,  rubs, gallops or clicks. No  murmurs. Gastrointestinal system: Abdomen is nondistended, soft and nontender. No organomegaly or masses felt. Normal bowel sounds heard. Central nervous system: Alert and oriented. No focal neurological deficits. Extremities: No clubbing, edema, or cyanosis.Left great toe amputation noted with second left digit toenail absent, with erythema swelling and tenderness of the left foot and left lower leg. Pulses diminished. No change in swelling, but less erythematous. Skin: Left lower leg as described above, extensive scars both legs.  Psychiatry: Judgement and insight appear normal. Mood & affect appropriate.   Data Reviewed:   I have personally reviewed following labs and imaging studies:  Labs: Basic Metabolic Panel:  Recent Labs Lab 11/30/15 1639 12/01/15 0431  NA 142 138  K 4.7 4.3  CL 109 107  CO2 29 27  GLUCOSE 136* 139*  BUN 16 17  CREATININE 0.99 0.86  CALCIUM 9.3 9.2   GFR Estimated Creatinine Clearance: 103.8 mL/min (by C-G formula based on Cr of 0.86). Liver Function Tests: No results for input(s): AST, ALT, ALKPHOS, BILITOT, PROT, ALBUMIN in the last 168 hours. No results for input(s): LIPASE, AMYLASE in the last 168 hours. No results for input(s): AMMONIA in the last 168 hours. Coagulation profile  Recent Labs Lab 12/01/15 0431  INR 1.07    CBC:  Recent Labs Lab 11/30/15 1639 12/01/15 0431  WBC 9.0 7.9  NEUTROABS 6.4  --   HGB 10.3* 10.2*  HCT 32.1* 32.3*  MCV 92.2 91.8  PLT 206 205   Cardiac Enzymes:  Recent Labs Lab 12/01/15 0431 12/01/15 0801 12/01/15 1423  TROPONINI <0.03 <0.03 <0.03   BNP (last 3 results) No results for input(s): PROBNP in the last 8760 hours. CBG:  Recent Labs Lab 12/01/15 0758 12/01/15 1121 12/01/15 1617 12/01/15 2219 12/02/15 0634  GLUCAP 119* 156* 294* 152* 176*   D-Dimer:  Recent Labs  12/01/15 0431  DDIMER 0.32   Sepsis Labs:  Recent Labs Lab 11/30/15 1639 12/01/15 0431 12/01/15 0457  12/01/15 0801  PROCALCITON  --  <0.10  --   --   WBC 9.0 7.9  --   --   LATICACIDVEN  --   --  0.7 0.7   Urine analysis:    Component Value Date/Time   COLORURINE YELLOW 04/24/2015 1056   APPEARANCEUR CLEAR 04/24/2015 1056   LABSPEC 1.013 04/24/2015 1056   PHURINE 5.0 04/24/2015 1056   GLUCOSEU NEGATIVE 04/24/2015 1056   HGBUR MODERATE* 04/24/2015 1056   BILIRUBINUR NEGATIVE 04/24/2015 1056   KETONESUR NEGATIVE 04/24/2015 1056   PROTEINUR NEGATIVE 04/24/2015 1056   UROBILINOGEN 0.2 04/24/2015 1056   NITRITE NEGATIVE 04/24/2015 1056   LEUKOCYTESUR NEGATIVE 04/24/2015 1056   Microbiology Recent Results (from the past 240 hour(s))  Culture, blood (x 2)     Status: None (Preliminary result)   Collection Time: 12/01/15  4:45 AM  Result Value Ref Range Status   Specimen Description BLOOD RIGHT ARM  Final   Special Requests IN PEDIATRIC BOTTLE [facility]  Final   Culture PENDING  Incomplete   Report Status PENDING  Incomplete    Radiology: Dg Chest 2 View  12/01/2015  CLINICAL DATA:  Chest pain for couple of hours. EXAM: CHEST  2 VIEW COMPARISON:  08/26/2015  FINDINGS: The heart size and mediastinal contours are within normal limits. Both lungs are clear. The visualized skeletal structures are unremarkable. IMPRESSION: No active cardiopulmonary disease. Electronically Signed   By: Lucienne Capers M.D.   On: 12/01/2015 01:37   Mr Foot Left Wo Contrast  12/02/2015  CLINICAL DATA:  Left foot pain and left second toe swelling for 2 weeks. Fever and chills. Question abscess or osteomyelitis. EXAM: MRI OF THE LEFT FOREFOOT WITHOUT CONTRAST TECHNIQUE: Multiplanar, multisequence MR imaging was performed. No intravenous contrast was administered. COMPARISON:  Plain films left foot 11/30/2015. MRI left foot 08/02/2015. FINDINGS: Amputation of the great toe is identified as seen on comparison plain films. Marked soft tissue edema is seen over the dorsum of the foot. A large area of heterogeneous signal  abnormality is seen in the subcutaneous soft tissues over the dorsum of the foot. This area measures up to 4.4 cm transverse at the level of the distal metatarsals by 1.8 cm craniocaudal by 5.6 cm long. Smaller areas of similar signal abnormality are seen extending to the level of the mid foot. The coronal STIR sequence demonstrates mild appearing edema distal phalanx of the second toe but this cannot be confirmed on other sequences. Bone marrow signal is otherwise unremarkable. IMPRESSION: Intense edema over the dorsum of the foot is compatible with cellulitis. Heterogeneous areas of signal abnormality in the dorsum of the foot is worrisome for abscess. Mildly increased T2 signal in the coronal STIR sequence in the distal phalanx of the second toe cannot be confirmed on other sequences. No convincing evidence of osteomyelitis is identified. Electronically Signed   By: Inge Rise M.D.   On: 12/02/2015 08:43   Dg Foot Complete Left  12/01/2015  CLINICAL DATA:  Left foot wound. Diabetes. Second toe nail fell off 5 days ago. EXAM: LEFT FOOT - COMPLETE 3+ VIEW COMPARISON:  11/21/2015 FINDINGS: Previous amputation of the left first toe at the metatarsal-phalangeal joint level. Soft tissues at the site of resection or prominent but unchanged since previous study. No radiopaque soft tissue foreign bodies or gas collections. Vascular calcifications. No bone erosion or sclerosis to suggest osteomyelitis. No acute fracture or dislocation. IMPRESSION: Previous amputation of the left first toe. No evidence of osteomyelitis. Electronically Signed   By: Lucienne Capers M.D.   On: 12/01/2015 00:27    Medications:   . amLODipine  5 mg Oral Daily  . aspirin EC  325 mg Oral Daily  . atorvastatin  80 mg Oral q1800  . carvedilol  12.5 mg Oral BID WC  . divalproex  1,000 mg Oral QHS  . enoxaparin (LOVENOX) injection  40 mg Subcutaneous Q24H  . ferrous sulfate  325 mg Oral BID WC  . FLUoxetine  10 mg Oral Daily  .  gabapentin  100 mg Oral TID  . insulin aspart  0-5 Units Subcutaneous QHS  . insulin aspart  0-9 Units Subcutaneous TID WC  . lisinopril  40 mg Oral Daily  . pantoprazole  40 mg Oral Daily  . piperacillin-tazobactam (ZOSYN)  IV  3.375 g Intravenous Q8H  . sodium chloride flush  3 mL Intravenous Q12H  . vancomycin  750 mg Intravenous Q8H   Continuous Infusions: . sodium chloride Stopped (12/01/15 1859)    Time spent: 25 minutes.   [facility] Pager [phone]. If unable to reach me by pager, please call my cell phone at [phone].  *Please refer to [email or web address], password [email or web address] to get updated schedule on who will  round on this patient, as hospitalists switch teams weekly. If 7PM-7AM, please contact night-coverage at [email or web address], password [email or web address] for any overnight needs.  12/02/2015, 9:15 AM

## 2015-12-02 NOTE — Anesthesia Preprocedure Evaluation (Signed)
Anesthesia Evaluation  Patient identified by MRN, date of birth, ID band Patient awake    Reviewed: Allergy & Precautions, NPO status , Patient's Chart, lab work & pertinent test results, reviewed documented beta blocker date and time   History of Anesthesia Complications Negative for: history of anesthetic complications  Airway Mallampati: IV  TM Distance: >3 FB Neck ROM: Full    Dental  (+) Teeth Intact   Pulmonary neg shortness of breath, sleep apnea , neg recent URI, neg PE   breath sounds clear to auscultation       Cardiovascular hypertension, Pt. on medications and Pt. on home beta blockers (-) angina+ Peripheral Vascular Disease and +CHF  (-) Past MI  Rhythm:Regular + Systolic murmurs    Neuro/Psych neg Seizures PSYCHIATRIC DISORDERS Anxiety Depression Bipolar Disorder TIA Neuromuscular disease    GI/Hepatic Neg liver ROS, GERD  Medicated and Controlled,  Endo/Other  diabetes, Type 2, Insulin Dependent  Renal/GU negative Renal ROS     Musculoskeletal   Abdominal   Peds  Hematology  (+) anemia ,   Anesthesia Other Findings   Reproductive/Obstetrics                             Anesthesia Physical Anesthesia Plan  ASA: III  Anesthesia Plan: General   Post-op Pain Management:    Induction: Intravenous  Airway Management Planned: Oral ETT  Additional Equipment: None  Intra-op Plan:   Post-operative Plan: Extubation in OR  Informed Consent: I have reviewed the patients History and Physical, chart, labs and discussed the procedure including the risks, benefits and alternatives for the proposed anesthesia with the patient or authorized representative who has indicated his/her understanding and acceptance.   Dental advisory given  Plan Discussed with: CRNA and Surgeon  Anesthesia Plan Comments:         Anesthesia Quick Evaluation

## 2015-12-02 NOTE — Anesthesia Procedure Notes (Signed)
Procedure Name: Intubation Date/Time: 12/02/2015 2:43 PM Performed by: Suzy Bouchard Pre-anesthesia Checklist: Patient identified, Emergency Drugs available, Suction available, Timeout performed and Patient being monitored Patient Re-evaluated:Patient Re-evaluated prior to inductionOxygen Delivery Method: Circle system utilized Preoxygenation: Pre-oxygenation with 100% oxygen Intubation Type: IV induction Ventilation: Mask ventilation without difficulty Laryngoscope Size: Miller and 2 Grade View: Grade I Tube type: Oral Laser Tube: Cuffed inflated with minimal occlusive pressure - saline Tube size: 7.5 mm Number of attempts: 1 Airway Equipment and Method: Stylet and LTA kit utilized Placement Confirmation: ETT inserted through vocal cords under direct vision,  positive ETCO2 and breath sounds checked- equal and bilateral Secured at: 23 cm Tube secured with: Tape Dental Injury: Teeth and Oropharynx as per pre-operative assessment

## 2015-12-02 NOTE — Transfer of Care (Signed)
Immediate Anesthesia Transfer of Care Note  Patient: Shane [name]  Procedure(s) Performed: Procedure(s): IRRIGATION AND DEBRIDEMENT OF FOOT; LEFT SECOND TOE AMPUTATION (Left)  Patient Location: PACU  Anesthesia Type:General  Level of Consciousness: sedated  Airway & Oxygen Therapy: Patient Spontanous Breathing and Patient connected to nasal cannula oxygen  Post-op Assessment: Report given to RN, Post -op Vital signs reviewed and stable and Patient moving all extremities  Post vital signs: Reviewed and stable  Last Vitals:  Filed Vitals:   12/02/15 1320 12/02/15 1545  BP: 163/89 130/96  Pulse: 85 76  Temp: 36.3 C 36.5 C  Resp: 16 12    Last Pain:  Filed Vitals:   12/02/15 1554  PainSc: 0-No pain      Patients Stated Pain Goal: 3 ([id number])  Complications: No apparent anesthesia complications

## 2015-12-02 NOTE — Brief Op Note (Signed)
11/30/2015 - 12/02/2015  3:33 PM  PATIENT:  Shane [name]  45 y.o. male  PRE-OPERATIVE DIAGNOSIS:  DIABETIC FOOT INFECTION  POST-OPERATIVE DIAGNOSIS:  Diabetic foot infection*  PROCEDURE:  Procedure(s): IRRIGATION AND DEBRIDEMENT OF FOOT; LEFT SECOND TOE AMPUTATION  SURGEON:  Surgeon(s): Meredith Pel, MD  ASSISTANT: none  ANESTHESIA:   general  EBL: 3 ml    Total I/O In: 240 [P.O.:240] Out: 1100 [Urine:1100]  BLOOD ADMINISTERED: none  DRAINS: Penrose drain in the foot   LOCAL MEDICATIONS USED:  none  SPECIMEN:  Toe to path - dorsal foot abcess   COUNTS:  YES  TOURNIQUET:  * No tourniquets in log *  DICTATION: .Other Dictation: Dictation Number [id number]  PLAN OF CARE: Admit to inpatient   PATIENT DISPOSITION:  PACU - hemodynamically stable

## 2015-12-03 LAB — BASIC METABOLIC PANEL
ANION GAP: 5 (ref 5–15)
BUN: 13 mg/dL (ref 6–20)
CALCIUM: 8.5 mg/dL — AB (ref 8.9–10.3)
CO2: 26 mmol/L (ref 22–32)
CREATININE: 0.98 mg/dL (ref 0.61–1.24)
Chloride: 107 mmol/L (ref 101–111)
GLUCOSE: 181 mg/dL — AB (ref 65–99)
Potassium: 4.5 mmol/L (ref 3.5–5.1)
SODIUM: 138 mmol/L (ref 135–145)

## 2015-12-03 LAB — CBC
HEMATOCRIT: 27.5 % — AB (ref 39.0–52.0)
HEMOGLOBIN: 8.9 g/dL — AB (ref 13.0–17.0)
MCH: 29.5 pg (ref 26.0–34.0)
MCHC: 32.4 g/dL (ref 30.0–36.0)
MCV: 91.1 fL (ref 78.0–100.0)
Platelets: 175 10*3/uL (ref 150–400)
RBC: 3.02 MIL/uL — AB (ref 4.22–5.81)
RDW: 12.9 % (ref 11.5–15.5)
WBC: 9.9 10*3/uL (ref 4.0–10.5)

## 2015-12-03 LAB — GLUCOSE, CAPILLARY
GLUCOSE-CAPILLARY: 161 mg/dL — AB (ref 65–99)
Glucose-Capillary: 161 mg/dL — ABNORMAL HIGH (ref 65–99)
Glucose-Capillary: 129 mg/dL — ABNORMAL HIGH (ref 65–99)
Glucose-Capillary: 191 mg/dL — ABNORMAL HIGH (ref 65–99)

## 2015-12-03 NOTE — Evaluation (Signed)
Physical Therapy Evaluation Patient Details Name: Shane [name] MRN: [id number] DOB: January 14, 1971 Today's Date: 12/03/2015   History of Present Illness  Pt is a 45 y/o M s/p [name].  Pt's PMH includes Lt great toe amputation, TIA, anemia, depression, anxiety, bipolar disorder.    Clinical Impression  Pt admitted with above diagnosis. Pt currently with functional limitations due to the deficits listed below (see PT Problem List). PTA Shane [name] was Ind w/ all mobility and ADLs using cane for safe ambulation.  He currently requires supervision for safety w/ transfers and ambulation. Completed stair training this session.  Pt will have 24/7 assist/supervision available from his wife at d/c.  Pt will benefit from skilled PT to increase their independence and safety with mobility to allow discharge to the venue listed below.      Follow Up Recommendations Home health PT;Supervision for mobility/OOB    Equipment Recommendations  None recommended by PT    Recommendations for Other Services       Precautions / Restrictions Precautions Precautions: Fall Restrictions Weight Bearing Restrictions: Yes LLE Weight Bearing: Weight bearing as tolerated (encouraged WB through heel)      Mobility  Bed Mobility Overal bed mobility: Independent             General bed mobility comments: No cues or physical assist needed  Transfers Overall transfer level: Needs assistance Equipment used: Rolling walker (2 wheeled) Transfers: Sit to/from Stand Sit to Stand: Supervision         General transfer comment: Supervision for safety.  Pt w/ safe technique.  Ambulation/Gait Ambulation/Gait assistance: Supervision Ambulation Distance (Feet): 125 Feet Assistive device: Rolling walker (2 wheeled) Gait Pattern/deviations: Step-to pattern;Decreased stride length;Decreased step length - right;Decreased weight shift to left;Antalgic   Gait velocity interpretation: Below normal speed  for age/gender General Gait Details: Pt demonstrates TDWB due to pain, encouraged pt to WB through heel.  Pt's Bil UEs fatigue after ambualting 125 ft and requests to sit.   Stairs Stairs: Yes Stairs assistance: Supervision Stair Management: One rail Right;Sideways;Step to pattern Number of Stairs: 3 General stair comments: Cues for technique and to WB through heel.  Supervision for safety.  Wheelchair Mobility    Modified Rankin (Stroke Patients Only)       Balance Overall balance assessment: Needs assistance Sitting-balance support: No upper extremity supported;Feet supported Sitting balance-Leahy Scale: Good     Standing balance support: During functional activity;Bilateral upper extremity supported Standing balance-Leahy Scale: Poor Standing balance comment: Relies on UE support                             Pertinent Vitals/Pain Pain Assessment: 0-10 Pain Score: 8  Pain Location: Dorsum of Lt foot Pain Descriptors / Indicators: Aching Pain Intervention(s): Limited activity within patient's tolerance;Monitored during session;Repositioned    Home Living Family/patient expects to be discharged to:: Private residence Living Arrangements: Spouse/significant other;Other relatives (grandchild) Available Help at Discharge: Family;Available 24 hours/day Type of Home: House Home Access: Stairs to enter Entrance Stairs-Rails: Right Entrance Stairs-Number of Steps: 3 Home Layout: One level Home Equipment: Walker - 2 wheels;Cane - single point      Prior Function Level of Independence: Independent with assistive device(s)         Comments: Uses cane at all times     Hand Dominance        Extremity/Trunk Assessment   Upper Extremity Assessment: Overall WFL for tasks assessed  Lower Extremity Assessment: LLE deficits/detail   LLE Deficits / Details: s/p [name]  Cervical / Trunk Assessment: Normal  Communication    Communication: No difficulties  Cognition Arousal/Alertness: Awake/alert Behavior During Therapy: WFL for tasks assessed/performed Overall Cognitive Status: Within Functional Limits for tasks assessed                      General Comments      Exercises        Assessment/Plan    PT Assessment Patient needs continued PT services  PT Diagnosis Difficulty walking;Acute pain   PT Problem List Decreased activity tolerance;Decreased balance;Decreased knowledge of use of DME;Decreased safety awareness;Pain  PT Treatment Interventions DME instruction;Gait training;Stair training;Functional mobility training;Therapeutic activities;Therapeutic exercise;Balance training;Patient/family education   PT Goals (Current goals can be found in the Care Plan section) Acute Rehab PT Goals Patient Stated Goal: to go home PT Goal Formulation: With patient Time For Goal Achievement: 12/17/15 Potential to Achieve Goals: Good    Frequency Min 3X/week   Barriers to discharge        Co-evaluation               End of Session Equipment Utilized During Treatment: Gait belt Activity Tolerance: Patient tolerated treatment well Patient left: in bed;with call bell/phone within reach Nurse Communication: Mobility status         Time: [id number] PT Time Calculation (min) (ACUTE ONLY): 23 min   Charges:   PT Evaluation $PT Eval Low Complexity: 1 Procedure PT Treatments $Gait Training: 8-22 mins   PT G Codes:       Collie Siad PT, DPT  Pager: [phone] Phone: [phone] 12/03/2015, 10:31 AM

## 2015-12-03 NOTE — Progress Notes (Signed)
Progress Note    Shane [name]  ZOX:[id number] DOB: Jul 16, 1970  DOA: 11/30/2015 PCP: Minerva Ends, MD    Brief Narrative:   Shane [name] is an 45 y.o. male with medical history significant of hypertension, hyperlipidemia, diabetes mellitus, GERD, depression, anxiety, TIA, diastolic dysfunction, OSA not on CPAP, bipolar disorder, lower GI bleeding, Who was admitted 11/30/15 with chief complaint of left foot swelling and pain as well as chest pain. Being treated for left foot cellulitis.   Assessment/Plan:   Principal problems:  Left foot cellulitis/abscess with pain/Diabetic foot infection X-ray did not show osteo. Patient had lower extremity venous Doppler on 6/13 by his PCP, which was negative for DVT. D-dimer not elevated, so doubt DVT. Follow-up blood cultures. ESR/CRP not elevated. MRI of the left foot showed possible abscess, but no convincing evidence for osteomyelitis.  Evaluated by Dr. Marlou Sa of orthopedic surgery 12/02/15 and taken for irrigation and debridement of the foot with the left second toe amputation. Continue Rocephin, Flagyl and vancomycin for now. We'll discharge home on oral antibiotics 12/04/15 if stable. Follow-up blood and operative wound cultures.  Chest pain Chest x-ray is negative for infiltration. Troponins negative 3. EKG negative for ischemic changes. Continue ASA, lipitor and coreg, and prn NTG. 2-D echocardiogram showed EF 55-60 percent with no regional wall motion abnormalities or evidence of diastolic dysfunction. Pain is very atypical and that it is brought on and relieved by certain movements. No further workup necessary at this time. Treat symptomatically.Reports pain is resolved.  Active problems:  History of TIA Continue aspirin, Lipitor.  DM-II Last A1c 7.9 on 11/21/15, not well controled. Patient is taking metformin and glipizide at home. Currently being managed with insulin sensitive SSI. CBGs 155-190.  HLD Last LDL was 61 on 09/08/15.  Continue home medications: Lipitor.  HTN: Continue Coreg, amlodipine, lisinopril.  GERD Continue Protonix.  Depression with anxiety and bipolar Stable, no suicidal or homicidal ideations. Continue home medications: Prozac and Depakote.  Chronic diastolic congestive heart failure 2-D echo 04/03/15 showed EF of 55% with grade 1 diastolic dysfunction. Patient is not taking diuretics at home. Continue aspirin and Coreg. Chest x-ray clear.   Family Communication/Anticipated D/C date and plan/Code Status   DVT prophylaxis: Lovenox ordered. Code Status: Full Code.  Family Communication: No family currently at the bedside. Disposition Plan: Home 12/04/15.   Medical Consultants:    Orthopedic Surgery.   Procedures:   2 D Echo 12/01/15:   Study Conclusions  - Left ventricle: The cavity size was mildly dilated. Systolic  function was normal. The estimated ejection fraction was in the  range of 55% to 60%. Wall motion was normal; there were no  regional wall motion abnormalities. Left ventricular diastolic  function parameters were normal.  12/02/15 Procedure(s): IRRIGATION AND DEBRIDEMENT OF FOOT; LEFT SECOND TOE AMPUTATION  SURGEON: Surgeon(s): Meredith Pel, MD  Anti-Infectives:   Vancomycin 12/01/15---> Zosyn 12/01/15--->12/02/15 Rocephin 12/02/15---> Flagyl 12/02/15--->  Subjective:   Shane [name] denies any further chest pain. Has some foot pain. Appetite is fair. Reports that his bowels are moving.  Objective:    Filed Vitals:   12/02/15 1630 12/02/15 2019 12/03/15 0526 12/03/15 0819  BP: 175/94 161/91 141/84 153/89  Pulse: 76 88 88 94  Temp: 97.5 F (36.4 C) 97.5 F (36.4 C) 98.1 F (36.7 C)   TempSrc:  Oral Oral   Resp: 10 16 18    Height:      Weight:      SpO2: 98% 94%  94%     Intake/Output Summary (Last 24 hours) at 12/03/15 0830 Last data filed at 12/03/15 0600  Gross per 24 hour  Intake   1360 ml  Output   2910 ml  Net  -1550 ml    Filed Weights   11/30/15 1614 12/01/15 0434  Weight: 79.833 kg (176 lb) 77.1 kg (169 lb 15.6 oz)    Exam: General exam: Appears calm and comfortable.  Respiratory system: Clear to auscultation. Respiratory effort normal. Cardiovascular system: S1 & S2 heard, RRR. No JVD,  rubs, gallops or clicks. No murmurs. Gastrointestinal system: Abdomen is nondistended, soft and nontender. No organomegaly or masses felt. Normal bowel sounds heard. Central nervous system: Alert and oriented. No focal neurological deficits. Extremities: Left foot wrapped in a Ace wrap. Skin: As above, extensive scars both legs.  Psychiatry: Judgement and insight appear normal. Mood & affect appropriate.   Data Reviewed:   I have personally reviewed following labs and imaging studies:  Labs: Basic Metabolic Panel:  Recent Labs Lab 11/30/15 1639 12/01/15 0431 12/03/15 0223  NA 142 138 138  K 4.7 4.3 4.5  CL 109 107 107  CO2 29 27 26   GLUCOSE 136* 139* 181*  BUN 16 17 13   CREATININE 0.99 0.86 0.98  CALCIUM 9.3 9.2 8.5*   GFR Estimated Creatinine Clearance: 91.1 mL/min (by C-G formula based on Cr of 0.98). Liver Function Tests: No results for input(s): AST, ALT, ALKPHOS, BILITOT, PROT, ALBUMIN in the last 168 hours. No results for input(s): LIPASE, AMYLASE in the last 168 hours. No results for input(s): AMMONIA in the last 168 hours. Coagulation profile  Recent Labs Lab 12/01/15 0431  INR 1.07    CBC:  Recent Labs Lab 11/30/15 1639 12/01/15 0431 12/03/15 0223  WBC 9.0 7.9 9.9  NEUTROABS 6.4  --   --   HGB 10.3* 10.2* 8.9*  HCT 32.1* 32.3* 27.5*  MCV 92.2 91.8 91.1  PLT 206 205 175   Cardiac Enzymes:  Recent Labs Lab 12/01/15 0431 12/01/15 0801 12/01/15 1423  TROPONINI <0.03 <0.03 <0.03   BNP (last 3 results) No results for input(s): PROBNP in the last 8760 hours. CBG:  Recent Labs Lab 12/02/15 1117 12/02/15 1404 12/02/15 1551 12/02/15 2015 12/03/15 0628  GLUCAP  170* 190* 155* 162* 161*   D-Dimer:  Recent Labs  12/01/15 0431  DDIMER 0.32   Sepsis Labs:  Recent Labs Lab 11/30/15 1639 12/01/15 0431 12/01/15 0457 12/01/15 0801 12/03/15 0223  PROCALCITON  --  <0.10  --   --   --   WBC 9.0 7.9  --   --  9.9  LATICACIDVEN  --   --  0.7 0.7  --    Urine analysis:    Component Value Date/Time   COLORURINE YELLOW 04/24/2015 1056   APPEARANCEUR CLEAR 04/24/2015 1056   LABSPEC 1.013 04/24/2015 1056   PHURINE 5.0 04/24/2015 1056   GLUCOSEU NEGATIVE 04/24/2015 1056   HGBUR MODERATE* 04/24/2015 1056   BILIRUBINUR NEGATIVE 04/24/2015 1056   KETONESUR NEGATIVE 04/24/2015 1056   PROTEINUR NEGATIVE 04/24/2015 1056   UROBILINOGEN 0.2 04/24/2015 1056   NITRITE NEGATIVE 04/24/2015 1056   LEUKOCYTESUR NEGATIVE 04/24/2015 1056   Microbiology Recent Results (from the past 240 hour(s))  Culture, blood (x 2)     Status: None (Preliminary result)   Collection Time: 12/01/15  4:45 AM  Result Value Ref Range Status   Specimen Description BLOOD RIGHT ARM  Final   Special Requests IN PEDIATRIC BOTTLE 3CC  Final   Culture NO GROWTH 1 DAY  Final   Report Status PENDING  Incomplete  Culture, blood (x 2)     Status: None (Preliminary result)   Collection Time: 12/01/15  4:55 AM  Result Value Ref Range Status   Specimen Description BLOOD RIGHT HAND  Final   Special Requests IN PEDIATRIC BOTTLE 3CC  Final   Culture NO GROWTH 1 DAY  Final   Report Status PENDING  Incomplete  Aerobic/Anaerobic Culture (surgical/deep wound)     Status: None (Preliminary result)   Collection Time: 12/02/15  5:16 PM  Result Value Ref Range Status   Specimen Description WOUND  Final   Special Requests LEFT SECOND TOE  Final   Gram Stain   Final    RARE WBC PRESENT,BOTH PMN AND MONONUCLEAR NO ORGANISMS SEEN    Culture PENDING  Incomplete   Report Status PENDING  Incomplete    Radiology: Mr Foot Left Wo Contrast  12/02/2015  CLINICAL DATA:  Left foot pain and left  second toe swelling for 2 weeks. Fever and chills. Question abscess or osteomyelitis. EXAM: MRI OF THE LEFT FOREFOOT WITHOUT CONTRAST TECHNIQUE: Multiplanar, multisequence MR imaging was performed. No intravenous contrast was administered. COMPARISON:  Plain films left foot 11/30/2015. MRI left foot 08/02/2015. FINDINGS: Amputation of the great toe is identified as seen on comparison plain films. Marked soft tissue edema is seen over the dorsum of the foot. A large area of heterogeneous signal abnormality is seen in the subcutaneous soft tissues over the dorsum of the foot. This area measures up to 4.4 cm transverse at the level of the distal metatarsals by 1.8 cm craniocaudal by 5.6 cm long. Smaller areas of similar signal abnormality are seen extending to the level of the mid foot. The coronal STIR sequence demonstrates mild appearing edema distal phalanx of the second toe but this cannot be confirmed on other sequences. Bone marrow signal is otherwise unremarkable. IMPRESSION: Intense edema over the dorsum of the foot is compatible with cellulitis. Heterogeneous areas of signal abnormality in the dorsum of the foot is worrisome for abscess. Mildly increased T2 signal in the coronal STIR sequence in the distal phalanx of the second toe cannot be confirmed on other sequences. No convincing evidence of osteomyelitis is identified. Electronically Signed   By: Inge Rise M.D.   On: 12/02/2015 08:43    Medications:   . amLODipine  5 mg Oral Daily  . atorvastatin  80 mg Oral q1800  . carvedilol  12.5 mg Oral BID WC  . cefTRIAXone (ROCEPHIN)  IV  2 g Intravenous Q24H   And  . metroNIDAZOLE  500 mg Oral Q8H  . divalproex  1,000 mg Oral QHS  . enoxaparin (LOVENOX) injection  30 mg Subcutaneous Q24H  . feeding supplement (GLUCERNA SHAKE)  237 mL Oral BID BM  . ferrous sulfate  325 mg Oral BID WC  . FLUoxetine  10 mg Oral Daily  . gabapentin  100 mg Oral TID  . insulin aspart  0-5 Units Subcutaneous  QHS  . insulin aspart  0-9 Units Subcutaneous TID WC  . lisinopril  40 mg Oral Daily  . pantoprazole  40 mg Oral Daily  . sodium chloride flush  3 mL Intravenous Q12H  . vancomycin  750 mg Intravenous Q8H   Continuous Infusions:    Time spent: 25 minutes.   [facility] Pager [phone]. If unable to reach me by pager, please call my cell phone at [phone].  *  Please refer to [email or web address], password [email or web address] to get updated schedule on who will round on this patient, as hospitalists switch teams weekly. If 7PM-7AM, please contact night-coverage at [email or web address], password [email or web address] for any overnight needs.  12/03/2015, 8:30 AM

## 2015-12-03 NOTE — Progress Notes (Signed)
Pt stable  Drain removed Ok to [employer] home tomorrow on oral abx wbat left foot F/u with me next monday

## 2015-12-04 ENCOUNTER — Encounter (HOSPITAL_COMMUNITY): Payer: Self-pay | Admitting: Orthopedic Surgery

## 2015-12-04 ENCOUNTER — Other Ambulatory Visit: Payer: Self-pay

## 2015-12-04 LAB — GLUCOSE, CAPILLARY
GLUCOSE-CAPILLARY: 194 mg/dL — AB (ref 65–99)
Glucose-Capillary: 143 mg/dL — ABNORMAL HIGH (ref 65–99)

## 2015-12-04 MED ORDER — SULFAMETHOXAZOLE-TRIMETHOPRIM 800-160 MG PO TABS: 1.0000 | ORAL_TABLET | Freq: Two times a day (BID) | ORAL | Status: DC

## 2015-12-04 MED ORDER — AMOXICILLIN-POT CLAVULANATE 875-125 MG PO TABS: 1.0000 | ORAL_TABLET | Freq: Two times a day (BID) | ORAL | Status: DC

## 2015-12-04 MED ORDER — DIVALPROEX SODIUM 500 MG PO DR TAB: 1000.0000 mg | DELAYED_RELEASE_TABLET | Freq: Every day | ORAL | Status: DC

## 2015-12-04 MED ORDER — OXYCODONE-ACETAMINOPHEN 5-325 MG PO TABS: 2.0000 | ORAL_TABLET | Freq: Four times a day (QID) | ORAL | Status: DC | PRN

## 2015-12-04 MED ORDER — GI COCKTAIL ~~LOC~~
30.0000 mL | Freq: Once | ORAL | Status: AC
  Administered 2015-12-04: 30 mL via ORAL

## 2015-12-04 MED FILL — DIVALPROEX SOD DR 500 MG TA: 500 | 30 days supply | Qty: 30 | Fill #3

## 2015-12-04 MED FILL — SULFAMETHOXAZOLE/TMP DS TAB: 800-160 | 5 days supply | Qty: 10 | Fill #0

## 2015-12-04 MED FILL — AMOX-CLAV 875-125 MG TABLET: 875-125 | 5 days supply | Qty: 10 | Fill #0

## 2015-12-04 MED FILL — ?METFORMIN HCL 1,000 MG TAB: 1000 | 30 days supply | Qty: 60 | Fill #2

## 2015-12-04 NOTE — Op Note (Signed)
Shane [name], Shane [name]                ACCOUNT NO.:  [id number]  MEDICAL RECORD NO.:  [id number]  LOCATION:                                 FACILITY:  PHYSICIAN:  Anderson Malta, M.D.    DATE OF BIRTH:  05-14-1971  DATE OF PROCEDURE:  12/02/2015 DATE OF DISCHARGE:                              OPERATIVE REPORT   PREOPERATIVE DIAGNOSIS:  Left foot and toe infection.  POSTOPERATIVE DIAGNOSIS:  Left foot and toe infection.  PROCEDURE:  Left foot I and D ,2nd toe amputation to the PIP joint.  SURGEON:  Anderson Malta, M.D.  ASSISTANT:  None.  ANESTHESIA:  General.  INDICATIONS:  Shane [name] is a 45 year old patient with left dorsal foot abscess by MRI scan, along with possible left 2nd toe infection, followed the same clinical course as his left great toe infection, presents now for operative management after explanation of risks and benefits.  PROCEDURE IN DETAIL:  The patient was brought to the operating room where general anesthetic was induced.  Preoperative antibiotics were administered.  Time-out was called. Left foot was prescrubbed with alcohol, then prepped with Hibiclens, draped in sterile manner.  The towel clip was placed on his toe, the toenail was off, bone quality distal phalanx was poor, toe was then amputated by fishmouth incision around the PIP joint.  The distal aspect of the proximal phalanx was removed with a rongeur.  The reasonable skin bleeding was achieved. Thorough irrigation performed and this toe amputation was closed with 3- 0 Vicryl, 3-0 nylon.  Dorsal incision was then made over a fluctuant pocket.  Skin and subcutaneous tissue were sharply divided.  Hematoma with potential for some fluctuance was observed.  This was sent for culture.  Thorough irrigation and excisional debridement performed with a curette.  The irrigation was performed with 3 liters of irrigating solution, Vanc powder then placed into this cavity which was then closed over a drain  using 3-0 nylon.  Bulky dressing applied.  The patient tolerated procedure well without any immediate complication, transferred to the recovery room in stable condition.     Anderson Malta, M.D.   ______________________________ Shane [name]. Shane [name], M.D.    GSD/MEDQ  D:  12/02/2015  T:  12/02/2015  Job:  [id number]

## 2015-12-04 NOTE — Discharge Summary (Signed)
Physician Discharge Summary  Shane [name] IHW:[id number] DOB: March 27, 1971 DOA: 11/30/2015  PCP: Minerva Ends, MD  Admit date: 11/30/2015 Discharge date: 12/04/2015   Recommendations for Outpatient Follow-Up:   1. Patient will follow-up with Dr. Marlou Sa in 10 days. 2. Please follow-up on final wound and blood cultures.   Discharge Diagnosis:   Principal Problem:    Cellulitis and abscess, left foot Active Problems:    History of TIA (transient ischemic attack)    DM2 (diabetes mellitus, type 2) (HCC)    HLD (hyperlipidemia)    Essential hypertension    GERD (gastroesophageal reflux disease)    Left foot pain    Depression with anxiety    Chest pain    Diabetic foot infection ([place])    Foot infection   Discharge disposition:  Home.    Discharge Condition: Improved.  Diet recommendation: Carbohydrate-modified.    Wound care: Leave dressings in place until follow-up with Dr. Marlou Sa.   History of Present Illness:   Shane [name] is an 45 y.o. male with medical history significant of hypertension, hyperlipidemia, diabetes mellitus, GERD, depression, anxiety, TIA, diastolic dysfunction, OSA not on CPAP, bipolar disorder, lower GI bleeding, Who was admitted 11/30/15 with chief complaint of left foot swelling and pain as well as chest pain. Being treated for left foot cellulitis.    Hospital Course by Problem:   Principal problems:  Left foot cellulitis/abscess with pain/Diabetic foot infection X-ray did not show osteo. Patient had lower extremity venous Doppler on 6/13 by his PCP, which was negative for DVT. D-dimer not elevated, so doubt DVT. Follow-up blood cultures. ESR/CRP not elevated. MRI of the left foot showed possible abscess, but no convincing evidence for osteomyelitis. Evaluated by Dr. Marlou Sa of orthopedic surgery 12/02/15 and taken for irrigation and debridement of the foot with the left second toe amputation. Treated with Rocephin, Flagyl and vancomycin  while in the hospital, will discharge home on an additional 5 days of treatment with Augmentin/Septra. Wound and blood cultures negative to date.  Chest pain Chest x-ray is negative for infiltration. Troponins negative 3. EKG negative for ischemic changes. Continue ASA, lipitor and coreg, and prn NTG. 2-D echocardiogram showed EF 55-60 percent with no regional wall motion abnormalities or evidence of diastolic dysfunction. Pain is very atypical and that it is brought on and relieved by certain movements. No further workup necessary at this time. Treat symptomatically.  Active problems:  History of TIA Continue aspirin, Lipitor.  DM-II Last A1c 7.9 on 11/21/15, not well controled. Patient is taking metformin and glipizide at home. Managed with insulin sensitive SSI while in the hospital. Resume usual treatment at discharge with close follow-up with PCP to assess glycemic control which may have been suboptimal secondary to infection.  HLD Last LDL was 61 on 09/08/15. Continue home medications: Lipitor.  HTN: Continue Coreg, amlodipine, lisinopril.  GERD Continue Protonix.  Depression with anxiety and bipolar Stable, no suicidal or homicidal ideations. Continue home medications: Prozac and Depakote.  Chronic diastolic congestive heart failure 2-D echo 04/03/15 showed EF of 55% with grade 1 diastolic dysfunction. Patient is not taking diuretics at home. Continue aspirin and Coreg. Chest x-ray clear.   Medical Consultants:    Orthopedic Surgery.   Discharge Exam:   Filed Vitals:   12/04/15 0935 12/04/15 1502  BP: 114/65 92/47  Pulse:  80  Temp:  98.1 F (36.7 C)  Resp:  19   Filed Vitals:   12/04/15 4917 12/04/15 0729 12/04/15 0935 12/04/15 1502  BP:  95/56 114/65 92/47  Pulse:  90  80  Temp:  98.6 F (37 C)  98.1 F (36.7 C)  TempSrc:  Oral  Oral  Resp:    19  Height:      Weight:      SpO2: 95%   92%    Gen:  NAD Cardiovascular:  RRR, No M/R/G Respiratory:  Lungs CTAB Gastrointestinal: Abdomen soft, NT/ND with normal active bowel sounds. Extremities: No C/E/C   The results of significant diagnostics from this hospitalization (including imaging, microbiology, ancillary and laboratory) are listed below for reference.     Procedures and Diagnostic Studies:   Dg Chest 2 View  12/01/2015  CLINICAL DATA:  Chest pain for couple of hours. EXAM: CHEST  2 VIEW COMPARISON:  08/26/2015 FINDINGS: The heart size and mediastinal contours are within normal limits. Both lungs are clear. The visualized skeletal structures are unremarkable. IMPRESSION: No active cardiopulmonary disease. Electronically Signed   By: Lucienne Capers M.D.   On: 12/01/2015 01:37   Mr Foot Left Wo Contrast  12/02/2015  CLINICAL DATA:  Left foot pain and left second toe swelling for 2 weeks. Fever and chills. Question abscess or osteomyelitis. EXAM: MRI OF THE LEFT FOREFOOT WITHOUT CONTRAST TECHNIQUE: Multiplanar, multisequence MR imaging was performed. No intravenous contrast was administered. COMPARISON:  Plain films left foot 11/30/2015. MRI left foot 08/02/2015. FINDINGS: Amputation of the great toe is identified as seen on comparison plain films. Marked soft tissue edema is seen over the dorsum of the foot. A large area of heterogeneous signal abnormality is seen in the subcutaneous soft tissues over the dorsum of the foot. This area measures up to 4.4 cm transverse at the level of the distal metatarsals by 1.8 cm craniocaudal by 5.6 cm long. Smaller areas of similar signal abnormality are seen extending to the level of the mid foot. The coronal STIR sequence demonstrates mild appearing edema distal phalanx of the second toe but this cannot be confirmed on other sequences. Bone marrow signal is otherwise unremarkable. IMPRESSION: Intense edema over the dorsum of the foot is compatible with cellulitis. Heterogeneous areas of signal abnormality in the dorsum of the foot is worrisome for  abscess. Mildly increased T2 signal in the coronal STIR sequence in the distal phalanx of the second toe cannot be confirmed on other sequences. No convincing evidence of osteomyelitis is identified. Electronically Signed   By: Inge Rise M.D.   On: 12/02/2015 08:43   Dg Foot Complete Left  12/01/2015  CLINICAL DATA:  Left foot wound. Diabetes. Second toe nail fell off 5 days ago. EXAM: LEFT FOOT - COMPLETE 3+ VIEW COMPARISON:  11/21/2015 FINDINGS: Previous amputation of the left first toe at the metatarsal-phalangeal joint level. Soft tissues at the site of resection or prominent but unchanged since previous study. No radiopaque soft tissue foreign bodies or gas collections. Vascular calcifications. No bone erosion or sclerosis to suggest osteomyelitis. No acute fracture or dislocation. IMPRESSION: Previous amputation of the left first toe. No evidence of osteomyelitis. Electronically Signed   By: Lucienne Capers M.D.   On: 12/01/2015 00:27   2 D Echo 12/01/15:   Study Conclusions  - Left ventricle: The cavity size was mildly dilated. Systolic  function was normal. The estimated ejection fraction was in the  range of 55% to 60%. Wall motion was normal; there were no  regional wall motion abnormalities. Left ventricular diastolic  function parameters were normal.  12/02/15 Procedure(s): IRRIGATION AND DEBRIDEMENT OF FOOT;  LEFT SECOND TOE AMPUTATION  SURGEON: Surgeon(s): Meredith Pel, MD  Labs:   Basic Metabolic Panel:  Recent Labs Lab 11/30/15 1639 12/01/15 0431 12/03/15 0223  NA 142 138 138  K 4.7 4.3 4.5  CL 109 107 107  CO2 29 27 26   GLUCOSE 136* 139* 181*  BUN 16 17 13   CREATININE 0.99 0.86 0.98  CALCIUM 9.3 9.2 8.5*   GFR Estimated Creatinine Clearance: 91.1 mL/min (by C-G formula based on Cr of 0.98). Liver Function Tests: No results for input(s): AST, ALT, ALKPHOS, BILITOT, PROT, ALBUMIN in the last 168 hours. No results for input(s): LIPASE, AMYLASE  in the last 168 hours. No results for input(s): AMMONIA in the last 168 hours. Coagulation profile  Recent Labs Lab 12/01/15 0431  INR 1.07    CBC:  Recent Labs Lab 11/30/15 1639 12/01/15 0431 12/03/15 0223  WBC 9.0 7.9 9.9  NEUTROABS 6.4  --   --   HGB 10.3* 10.2* 8.9*  HCT 32.1* 32.3* 27.5*  MCV 92.2 91.8 91.1  PLT 206 205 175   Cardiac Enzymes:  Recent Labs Lab 12/01/15 0431 12/01/15 0801 12/01/15 1423  TROPONINI <0.03 <0.03 <0.03   CBG:  Recent Labs Lab 12/03/15 1107 12/03/15 1629 12/03/15 2051 12/04/15 0604 12/04/15 1116  GLUCAP 129* 191* 161* 143* 194*   Microbiology Recent Results (from the past 240 hour(s))  Culture, blood (x 2)     Status: None (Preliminary result)   Collection Time: 12/01/15  4:45 AM  Result Value Ref Range Status   Specimen Description BLOOD RIGHT ARM  Final   Special Requests IN PEDIATRIC BOTTLE 3CC  Final   Culture NO GROWTH 2 DAYS  Final   Report Status PENDING  Incomplete  Culture, blood (x 2)     Status: None (Preliminary result)   Collection Time: 12/01/15  4:55 AM  Result Value Ref Range Status   Specimen Description BLOOD RIGHT HAND  Final   Special Requests IN PEDIATRIC BOTTLE 3CC  Final   Culture NO GROWTH 2 DAYS  Final   Report Status PENDING  Incomplete  Aerobic/Anaerobic Culture (surgical/deep wound)     Status: None (Preliminary result)   Collection Time: 12/02/15  5:16 PM  Result Value Ref Range Status   Specimen Description WOUND  Final   Special Requests LEFT SECOND TOE  Final   Gram Stain   Final    RARE WBC PRESENT,BOTH PMN AND MONONUCLEAR NO ORGANISMS SEEN    Culture NO GROWTH 2 DAYS  Final   Report Status PENDING  Incomplete     Discharge Instructions:   Discharge Instructions    Call MD for:  redness, tenderness, or signs of infection (pain, swelling, redness, odor or green/yellow discharge around incision site)    Complete by:  As directed      Call MD for:  temperature >100.4    Complete  by:  As directed      Diet Carb Modified    Complete by:  As directed      Increase activity slowly    Complete by:  As directed      Leave dressing on - Keep it clean, dry, and intact until clinic visit    Complete by:  As directed             Medication List    TAKE these medications        amLODipine 5 MG tablet  Commonly known as:  NORVASC  Take 1 tablet (5  mg total) by mouth daily.     amoxicillin-clavulanate 875-125 MG tablet  Commonly known as:  AUGMENTIN  Take 1 tablet by mouth 2 (two) times daily.     aspirin EC 325 MG tablet  Take 1 tablet (325 mg total) by mouth daily.     atorvastatin 80 MG tablet  Commonly known as:  LIPITOR  Take 1 tablet (80 mg total) by mouth every morning.     carvedilol 12.5 MG tablet  Commonly known as:  COREG  TAKE 1 TABLET BY MOUTH 2 TIMES DAILY WITH A MEAL     divalproex 500 MG DR tablet  Commonly known as:  DEPAKOTE  Take 2 tablets (1,000 mg total) by mouth at bedtime.     ferrous sulfate 325 (65 FE) MG tablet  Take 1 tablet (325 mg total) by mouth 2 (two) times daily with a meal.     FLUoxetine 10 MG tablet  Commonly known as:  PROZAC  Take 10 mg by mouth daily.     gabapentin 100 MG capsule  Commonly known as:  NEURONTIN  Take 1 capsule (100 mg total) by mouth 3 (three) times daily.     glipiZIDE 10 MG tablet  Commonly known as:  GLUCOTROL  Take 1 tablet (10 mg total) by mouth 2 (two) times daily before a meal.     ibuprofen 800 MG tablet  Commonly known as:  ADVIL,MOTRIN  Take 1 tablet (800 mg total) by mouth every 8 (eight) hours as needed.     lisinopril 40 MG tablet  Commonly known as:  PRINIVIL,ZESTRIL  Take 1 tablet (40 mg total) by mouth daily.     metFORMIN 1000 MG tablet  Commonly known as:  GLUCOPHAGE  Take 1 tablet (1,000 mg total) by mouth 2 (two) times daily with a meal.     omeprazole 20 MG capsule  Commonly known as:  PRILOSEC  Take 1 capsule (20 mg total) by mouth daily.      oxyCODONE-acetaminophen 5-325 MG tablet  Commonly known as:  PERCOCET/ROXICET  Take 2 tablets by mouth every 6 (six) hours as needed for severe pain.     sulfamethoxazole-trimethoprim 800-160 MG tablet  Commonly known as:  BACTRIM DS,SEPTRA DS  Take 1 tablet by mouth 2 (two) times daily.     traZODone 50 MG tablet  Commonly known as:  DESYREL  Take 0.5-1 tablets (25-50 mg total) by mouth at bedtime as needed for sleep.           Follow-up Information    Follow up with Meredith Pel, MD In 10 days.   Specialty:  Orthopedic Surgery   Contact information:   [place] [address] [phone]       Please follow up.   Why:  Call office for appt       Time coordinating discharge: 35 minutes.  Signed:  Haskell Rihn  Pager [phone] [facility] 12/04/2015, 3:27 PM

## 2015-12-04 NOTE — Progress Notes (Signed)
Pt reported 8/10 chest pain, given 2 L oxygen via nasal cannula, given nitro x3 with no relief, MD notified, pt recently received percocet tab x2 for foot pain,  pt has hx of GERD, reglan given, RN will notify MD if pain is unchanged after reglan, RN will continue to monitor, pt call bell within reach and bed in lowest position.      Izola Price, RN 12/04/2015

## 2015-12-04 NOTE — Progress Notes (Signed)
Pt still reports 9/10 chest pain now despite previous interventions, MD notified and orders initiated, oncoming RN notified and will continue to monitor pt, pt call bell within reach and bed in lowest position.     Izola Price, RN 12/04/2015

## 2015-12-04 NOTE — Progress Notes (Signed)
12/04/2015 5:00 PM Discharge AVS meds taken today and those due this evening reviewed.  Follow-up appointments and when to call md reviewed.  D/C IV and TELE.  Questions and concerns addressed.   D/C home per orders. [place]

## 2015-12-04 NOTE — Progress Notes (Signed)
Pharmacy Antibiotic Note  Shane [name] is a 45 y.o. male admitted on 11/30/2015 with L foot infection.  Pharmacy has been consulted for Vancomycin and Zosyn dosing. Pt on Augmentin since 6/6.  Historical dosing: VT 13 mcg/ml on 750mg  IV q8h (07/2015), SCr 1.02  No fevers, wbc normal, scr remains normal. Still c/o chest pain. Plan is for discharge in the next 24 hours on oral abx so will continue to hold off on checking vancomycin level at this time.  Plan: Continue Vancomycin 750mg  IV q8h Will f/u micro data, renal function, and pt's clinical condition Vanc trough prn   Height: 5\' 8"  (172.7 cm) Weight: 169 lb 15.6 oz (77.1 kg) IBW/kg (Calculated) : 68.4  Temp (24hrs), Avg:99 F (37.2 C), Min:98.1 F (36.7 C), Max:101.3 F (38.5 C)   Recent Labs Lab 11/30/15 1639 12/01/15 0431 12/01/15 0457 12/01/15 0801 12/03/15 0223  WBC 9.0 7.9  --   --  9.9  CREATININE 0.99 0.86  --   --  0.98  LATICACIDVEN  --   --  0.7 0.7  --     Estimated Creatinine Clearance: 91.1 mL/min (by C-G formula based on Cr of 0.98).    Allergies  Allergen Reactions  . Eggs Or Egg-Derived Products Diarrhea  . Other Other (See Comments)    Red meat causes stomach pains, bloating and diarrhea  . Milk-Related Compounds Diarrhea and Other (See Comments)    Any dairy products  - diarrhea and bloating    Antimicrobials this admission: 6/16 Vanc >>  6/17 ceftriaxone>>  Dose adjustments this admission: n/a  Microbiology results: BCx x2: ngtd Wound cx ngtd  Thank you for allowing pharmacy to be a part of this patient's care.  Erin Hearing PharmD., BCPS Clinical Pharmacist Pager [phone] 12/04/2015 11:38 AM

## 2015-12-04 NOTE — Discharge Instructions (Signed)
Ok to weight bear on left foot Keep dressing dry Cellulitis Cellulitis is an infection of the skin and the tissue beneath it. The infected area is usually red and tender. Cellulitis occurs most often in the arms and lower legs.  CAUSES  Cellulitis is caused by bacteria that enter the skin through cracks or cuts in the skin. The most common types of bacteria that cause cellulitis are staphylococci and streptococci. SIGNS AND SYMPTOMS   Redness and warmth.  Swelling.  Tenderness or pain.  Fever. DIAGNOSIS  Your health care provider can usually determine what is wrong based on a physical exam. Blood tests may also be done. TREATMENT  Treatment usually involves taking an antibiotic medicine. HOME CARE INSTRUCTIONS   Take your antibiotic medicine as directed by your health care provider. Finish the antibiotic even if you start to feel better.  Keep the infected arm or leg elevated to reduce swelling.  Apply a warm cloth to the affected area up to 4 times per day to relieve pain.  Take medicines only as directed by your health care provider.  Keep all follow-up visits as directed by your health care provider. SEEK MEDICAL CARE IF:   You notice red streaks coming from the infected area.  Your red area gets larger or turns dark in color.  Your bone or joint underneath the infected area becomes painful after the skin has healed.  Your infection returns in the same area or another area.  You notice a swollen bump in the infected area.  You develop new symptoms.  You have a fever. SEEK IMMEDIATE MEDICAL CARE IF:   You feel very sleepy.  You develop vomiting or diarrhea.  You have a general ill feeling (malaise) with muscle aches and pains.   This information is not intended to replace advice given to you by your health care provider. Make sure you discuss any questions you have with your health care provider.   Document Released: 03/13/2005 Document Revised: 02/22/2015  Document Reviewed: 08/19/2011 Elsevier Interactive Patient Education [employer].

## 2015-12-05 LAB — HIV ANTIBODY (ROUTINE TESTING W REFLEX): HIV SCREEN 4TH GENERATION: NONREACTIVE

## 2015-12-05 MED FILL — CARVEDILOL 12.5 MG TABLET: 12.5 | 30 days supply | Qty: 60 | Fill #3

## 2015-12-05 MED FILL — LISINOPRIL 40 MG TABLET: 40 | 30 days supply | Qty: 30 | Fill #2

## 2015-12-06 LAB — CULTURE, BLOOD (ROUTINE X 2)
CULTURE: NO GROWTH
CULTURE: NO GROWTH

## 2015-12-07 LAB — AEROBIC/ANAEROBIC CULTURE W GRAM STAIN (SURGICAL/DEEP WOUND)

## 2015-12-07 LAB — AEROBIC/ANAEROBIC CULTURE (SURGICAL/DEEP WOUND): CULTURE: NO GROWTH

## 2015-12-10 ENCOUNTER — Other Ambulatory Visit: Payer: Self-pay

## 2015-12-10 ENCOUNTER — Observation Stay (HOSPITAL_COMMUNITY)
Admission: EM | Admit: 2015-12-10 | Discharge: 2015-12-12 | Disposition: A | Discharge status: Home / Self Care | Payer: Medicaid Other | Attending: Internal Medicine | Admitting: Internal Medicine

## 2015-12-10 ENCOUNTER — Emergency Department (HOSPITAL_COMMUNITY): Payer: Medicaid Other

## 2015-12-10 ENCOUNTER — Encounter (HOSPITAL_COMMUNITY): Payer: Self-pay | Admitting: *Deleted

## 2015-12-10 DIAGNOSIS — Z7982 Long term (current) use of aspirin: Secondary | ICD-10-CM | POA: Insufficient documentation

## 2015-12-10 DIAGNOSIS — G459 Transient cerebral ischemic attack, unspecified: Principal | ICD-10-CM | POA: Diagnosis present

## 2015-12-10 DIAGNOSIS — G4700 Insomnia, unspecified: Secondary | ICD-10-CM

## 2015-12-10 DIAGNOSIS — I639 Cerebral infarction, unspecified: Secondary | ICD-10-CM

## 2015-12-10 DIAGNOSIS — Z89429 Acquired absence of other toe(s), unspecified side: Secondary | ICD-10-CM | POA: Diagnosis not present

## 2015-12-10 DIAGNOSIS — Z89412 Acquired absence of left great toe: Secondary | ICD-10-CM | POA: Insufficient documentation

## 2015-12-10 DIAGNOSIS — R29898 Other symptoms and signs involving the musculoskeletal system: Secondary | ICD-10-CM

## 2015-12-10 DIAGNOSIS — E785 Hyperlipidemia, unspecified: Secondary | ICD-10-CM | POA: Diagnosis not present

## 2015-12-10 DIAGNOSIS — I5032 Chronic diastolic (congestive) heart failure: Secondary | ICD-10-CM | POA: Insufficient documentation

## 2015-12-10 DIAGNOSIS — R262 Difficulty in walking, not elsewhere classified: Secondary | ICD-10-CM | POA: Insufficient documentation

## 2015-12-10 DIAGNOSIS — M79675 Pain in left toe(s): Secondary | ICD-10-CM

## 2015-12-10 DIAGNOSIS — K219 Gastro-esophageal reflux disease without esophagitis: Secondary | ICD-10-CM | POA: Diagnosis not present

## 2015-12-10 DIAGNOSIS — D649 Anemia, unspecified: Secondary | ICD-10-CM | POA: Diagnosis present

## 2015-12-10 DIAGNOSIS — I6529 Occlusion and stenosis of unspecified carotid artery: Secondary | ICD-10-CM

## 2015-12-10 DIAGNOSIS — I110 Hypertensive heart disease with heart failure: Secondary | ICD-10-CM | POA: Diagnosis not present

## 2015-12-10 DIAGNOSIS — L02612 Cutaneous abscess of left foot: Secondary | ICD-10-CM | POA: Diagnosis not present

## 2015-12-10 DIAGNOSIS — L03116 Cellulitis of left lower limb: Secondary | ICD-10-CM | POA: Diagnosis not present

## 2015-12-10 DIAGNOSIS — G451 Carotid artery syndrome (hemispheric): Secondary | ICD-10-CM

## 2015-12-10 DIAGNOSIS — Z7984 Long term (current) use of oral hypoglycemic drugs: Secondary | ICD-10-CM | POA: Diagnosis not present

## 2015-12-10 DIAGNOSIS — G44019 Episodic cluster headache, not intractable: Secondary | ICD-10-CM

## 2015-12-10 DIAGNOSIS — R200 Anesthesia of skin: Secondary | ICD-10-CM | POA: Diagnosis present

## 2015-12-10 DIAGNOSIS — D508 Other iron deficiency anemias: Secondary | ICD-10-CM

## 2015-12-10 DIAGNOSIS — E1142 Type 2 diabetes mellitus with diabetic polyneuropathy: Secondary | ICD-10-CM | POA: Insufficient documentation

## 2015-12-10 DIAGNOSIS — R1115 Cyclical vomiting syndrome unrelated to migraine: Secondary | ICD-10-CM

## 2015-12-10 DIAGNOSIS — F319 Bipolar disorder, unspecified: Secondary | ICD-10-CM | POA: Diagnosis not present

## 2015-12-10 DIAGNOSIS — R519 Headache, unspecified: Secondary | ICD-10-CM | POA: Diagnosis present

## 2015-12-10 DIAGNOSIS — N289 Disorder of kidney and ureter, unspecified: Secondary | ICD-10-CM | POA: Diagnosis not present

## 2015-12-10 DIAGNOSIS — N179 Acute kidney failure, unspecified: Secondary | ICD-10-CM

## 2015-12-10 DIAGNOSIS — E119 Type 2 diabetes mellitus without complications: Secondary | ICD-10-CM

## 2015-12-10 DIAGNOSIS — E11628 Type 2 diabetes mellitus with other skin complications: Secondary | ICD-10-CM

## 2015-12-10 DIAGNOSIS — K922 Gastrointestinal hemorrhage, unspecified: Secondary | ICD-10-CM

## 2015-12-10 DIAGNOSIS — F418 Other specified anxiety disorders: Secondary | ICD-10-CM | POA: Insufficient documentation

## 2015-12-10 DIAGNOSIS — R51 Headache: Secondary | ICD-10-CM

## 2015-12-10 DIAGNOSIS — R0789 Other chest pain: Secondary | ICD-10-CM

## 2015-12-10 DIAGNOSIS — E1149 Type 2 diabetes mellitus with other diabetic neurological complication: Secondary | ICD-10-CM

## 2015-12-10 DIAGNOSIS — L089 Local infection of the skin and subcutaneous tissue, unspecified: Secondary | ICD-10-CM

## 2015-12-10 DIAGNOSIS — E1100 Type 2 diabetes mellitus with hyperosmolarity without nonketotic hyperglycemic-hyperosmolar coma (NKHHC): Secondary | ICD-10-CM

## 2015-12-10 DIAGNOSIS — S98132A Complete traumatic amputation of one left lesser toe, initial encounter: Secondary | ICD-10-CM

## 2015-12-10 DIAGNOSIS — L0291 Cutaneous abscess, unspecified: Secondary | ICD-10-CM

## 2015-12-10 DIAGNOSIS — I10 Essential (primary) hypertension: Secondary | ICD-10-CM

## 2015-12-10 DIAGNOSIS — R112 Nausea with vomiting, unspecified: Secondary | ICD-10-CM

## 2015-12-10 DIAGNOSIS — M79672 Pain in left foot: Secondary | ICD-10-CM

## 2015-12-10 DIAGNOSIS — R209 Unspecified disturbances of skin sensation: Secondary | ICD-10-CM

## 2015-12-10 DIAGNOSIS — L0390 Cellulitis, unspecified: Secondary | ICD-10-CM

## 2015-12-10 DIAGNOSIS — F411 Generalized anxiety disorder: Secondary | ICD-10-CM

## 2015-12-10 LAB — DIFFERENTIAL
Basophils Absolute: 0 10*3/uL (ref 0.0–0.1)
Basophils Relative: 0 %
EOS PCT: 2 %
Eosinophils Absolute: 0.1 10*3/uL (ref 0.0–0.7)
LYMPHS ABS: 1.6 10*3/uL (ref 0.7–4.0)
LYMPHS PCT: 19 %
MONOS PCT: 4 %
Monocytes Absolute: 0.3 10*3/uL (ref 0.1–1.0)
NEUTROS PCT: 75 %
Neutro Abs: 6.1 10*3/uL (ref 1.7–7.7)

## 2015-12-10 LAB — I-STAT CHEM 8, ED
BUN: 22 mg/dL — ABNORMAL HIGH (ref 6–20)
CREATININE: 1.3 mg/dL — AB (ref 0.61–1.24)
Calcium, Ion: 1.18 mmol/L (ref 1.12–1.23)
Chloride: 105 mmol/L (ref 101–111)
GLUCOSE: 98 mg/dL (ref 65–99)
HCT: 34 % — ABNORMAL LOW (ref 39.0–52.0)
HEMOGLOBIN: 11.6 g/dL — AB (ref 13.0–17.0)
POTASSIUM: 4.5 mmol/L (ref 3.5–5.1)
Sodium: 142 mmol/L (ref 135–145)
TCO2: 25 mmol/L (ref 0–100)

## 2015-12-10 LAB — COMPREHENSIVE METABOLIC PANEL
ALBUMIN: 3.3 g/dL — AB (ref 3.5–5.0)
ALK PHOS: 49 U/L (ref 38–126)
ALT: 16 U/L — ABNORMAL LOW (ref 17–63)
ANION GAP: 8 (ref 5–15)
AST: 17 U/L (ref 15–41)
BILIRUBIN TOTAL: 0.4 mg/dL (ref 0.3–1.2)
BUN: 20 mg/dL (ref 6–20)
CALCIUM: 9 mg/dL (ref 8.9–10.3)
CHLORIDE: 106 mmol/L (ref 101–111)
CO2: 23 mmol/L (ref 22–32)
CREATININE: 1.32 mg/dL — AB (ref 0.61–1.24)
GFR calc Af Amer: >60 mL/min (ref >60)
Glucose, Bld: 100 mg/dL — ABNORMAL HIGH (ref 65–99)
Potassium: 4.3 mmol/L (ref 3.5–5.1)
Sodium: 137 mmol/L (ref 135–145)
Total Protein: 6.9 g/dL (ref 6.5–8.1)

## 2015-12-10 LAB — CBC
HEMATOCRIT: 31.9 % — AB (ref 39.0–52.0)
HEMOGLOBIN: 10.1 g/dL — AB (ref 13.0–17.0)
MCH: 28.9 pg (ref 26.0–34.0)
MCHC: 31.7 g/dL (ref 30.0–36.0)
MCV: 91.4 fL (ref 78.0–100.0)
Platelets: 429 10*3/uL — ABNORMAL HIGH (ref 150–400)
RBC: 3.49 MIL/uL — AB (ref 4.22–5.81)
RDW: 13.1 % (ref 11.5–15.5)
WBC: 8.1 10*3/uL (ref 4.0–10.5)

## 2015-12-10 LAB — I-STAT TROPONIN, ED: TROPONIN I, POC: 0.01 ng/mL (ref 0.00–0.08)

## 2015-12-10 LAB — APTT: aPTT: 29 seconds (ref 24–37)

## 2015-12-10 LAB — PROTIME-INR
INR: 1.13 (ref 0.00–1.49)
Prothrombin Time: 14.7 seconds (ref 11.6–15.2)

## 2015-12-10 LAB — CBG MONITORING, ED: GLUCOSE-CAPILLARY: 97 mg/dL (ref 65–99)

## 2015-12-10 LAB — GLUCOSE, CAPILLARY: Glucose-Capillary: 117 mg/dL — ABNORMAL HIGH (ref 65–99)

## 2015-12-10 MED ORDER — INSULIN ASPART 100 UNIT/ML ~~LOC~~ SOLN
0.0000 [IU] | Freq: Three times a day (TID) | SUBCUTANEOUS | Status: DC
  Administered 2015-12-11: 1 [IU] via SUBCUTANEOUS

## 2015-12-10 MED ORDER — FERROUS SULFATE 325 (65 FE) MG PO TABS
325.0000 mg | ORAL_TABLET | Freq: Two times a day (BID) | ORAL | Status: DC
  Administered 2015-12-11 – 2015-12-12 (×3): 325 mg via ORAL
  Filled 2015-12-10 (×3): qty 1

## 2015-12-10 MED ORDER — ASPIRIN EC 325 MG PO TBEC
325.0000 mg | DELAYED_RELEASE_TABLET | Freq: Every day | ORAL | Status: DC
  Administered 2015-12-11 – 2015-12-12 (×2): 325 mg via ORAL
  Filled 2015-12-10 (×2): qty 1

## 2015-12-10 MED ORDER — GLIPIZIDE 5 MG PO TABS
10.0000 mg | ORAL_TABLET | Freq: Two times a day (BID) | ORAL | Status: DC
  Administered 2015-12-11 – 2015-12-12 (×3): 10 mg via ORAL
  Filled 2015-12-10 (×3): qty 2

## 2015-12-10 MED ORDER — AMLODIPINE BESYLATE 5 MG PO TABS
5.0000 mg | ORAL_TABLET | Freq: Every day | ORAL | Status: DC
  Administered 2015-12-11 – 2015-12-12 (×2): 5 mg via ORAL
  Filled 2015-12-10 (×2): qty 1

## 2015-12-10 MED ORDER — GABAPENTIN 100 MG PO CAPS
100.0000 mg | ORAL_CAPSULE | Freq: Three times a day (TID) | ORAL | Status: DC
  Administered 2015-12-11 – 2015-12-12 (×4): 100 mg via ORAL
  Filled 2015-12-10 (×4): qty 1

## 2015-12-10 MED ORDER — STROKE: EARLY STAGES OF RECOVERY BOOK
Freq: Once | Status: AC
  Administered 2015-12-11: 01:00:00
  Filled 2015-12-10: qty 1

## 2015-12-10 MED ORDER — DIVALPROEX SODIUM 500 MG PO DR TAB
1000.0000 mg | DELAYED_RELEASE_TABLET | Freq: Every day | ORAL | Status: DC
  Administered 2015-12-11 (×2): 1000 mg via ORAL
  Filled 2015-12-10 (×2): qty 2

## 2015-12-10 MED ORDER — SULFAMETHOXAZOLE-TRIMETHOPRIM 800-160 MG PO TABS
1.0000 | ORAL_TABLET | Freq: Two times a day (BID) | ORAL | Status: DC
  Administered 2015-12-11 – 2015-12-12 (×3): 1 via ORAL
  Filled 2015-12-10 (×3): qty 1

## 2015-12-10 MED ORDER — SODIUM CHLORIDE 0.9 % IV SOLN
INTRAVENOUS | Status: AC
  Administered 2015-12-10: via INTRAVENOUS

## 2015-12-10 MED ORDER — CARVEDILOL 12.5 MG PO TABS
12.5000 mg | ORAL_TABLET | Freq: Two times a day (BID) | ORAL | Status: DC
  Administered 2015-12-11 – 2015-12-12 (×3): 12.5 mg via ORAL
  Filled 2015-12-10 (×3): qty 1

## 2015-12-10 MED ORDER — AMOXICILLIN-POT CLAVULANATE 875-125 MG PO TABS
1.0000 | ORAL_TABLET | Freq: Two times a day (BID) | ORAL | Status: DC
  Administered 2015-12-11 – 2015-12-12 (×3): 1 via ORAL
  Filled 2015-12-10 (×3): qty 1

## 2015-12-10 MED ORDER — FLUOXETINE HCL 20 MG PO TABS
10.0000 mg | ORAL_TABLET | Freq: Every day | ORAL | Status: DC
  Filled 2015-12-10: qty 1

## 2015-12-10 MED ORDER — PANTOPRAZOLE SODIUM 40 MG PO TBEC
40.0000 mg | DELAYED_RELEASE_TABLET | Freq: Every day | ORAL | Status: DC
  Administered 2015-12-11 – 2015-12-12 (×2): 40 mg via ORAL
  Filled 2015-12-10 (×2): qty 1

## 2015-12-10 MED ORDER — METFORMIN HCL 500 MG PO TABS
1000.0000 mg | ORAL_TABLET | Freq: Two times a day (BID) | ORAL | Status: DC
  Administered 2015-12-11 – 2015-12-12 (×3): 1000 mg via ORAL
  Filled 2015-12-10 (×3): qty 2

## 2015-12-10 MED ORDER — LISINOPRIL 20 MG PO TABS
40.0000 mg | ORAL_TABLET | Freq: Every day | ORAL | Status: DC
  Administered 2015-12-11 – 2015-12-12 (×2): 40 mg via ORAL
  Filled 2015-12-10 (×2): qty 2

## 2015-12-10 MED ORDER — OXYCODONE-ACETAMINOPHEN 5-325 MG PO TABS
2.0000 | ORAL_TABLET | Freq: Four times a day (QID) | ORAL | Status: DC | PRN
  Administered 2015-12-11 – 2015-12-12 (×4): 2 via ORAL
  Filled 2015-12-10 (×4): qty 2

## 2015-12-10 MED ORDER — INSULIN ASPART 100 UNIT/ML ~~LOC~~ SOLN: 0.0000 [IU] | Freq: Every day | SUBCUTANEOUS | Status: DC

## 2015-12-10 MED ORDER — TRAZODONE HCL 50 MG PO TABS
25.0000 mg | ORAL_TABLET | Freq: Every evening | ORAL | Status: DC | PRN
  Administered 2015-12-11: 50 mg via ORAL
  Filled 2015-12-10: qty 1

## 2015-12-10 MED ORDER — ATORVASTATIN CALCIUM 80 MG PO TABS
80.0000 mg | ORAL_TABLET | Freq: Every morning | ORAL | Status: DC
  Administered 2015-12-11 – 2015-12-12 (×2): 80 mg via ORAL
  Filled 2015-12-10 (×2): qty 1

## 2015-12-10 NOTE — Consult Note (Signed)
Admission H&P    Chief Complaint: Weakness and numbness involving left side.  HPI: Shane [name] is an 45 y.o. male with a history of hypertension, hyperlipidemia, diabetes mellitus, heart failure and TIAs presenting with recurrent acute left side numbness and left extremity weakness. Patient is also complaining of headache. Sensory symptoms have been occurring intermittently since 12/09/2015. He was last known well at 7:45 PM tonight. Speech has remained unchanged. No facial droop noted. He's been taking aspirin daily. He status post left great toe amputation 8 days ago. CT scan of his head showed no acute intracranial abnormality. NIH stroke score was 3. He was not considered a candidate for lytic therapy with IV TPA because of minimal clinical findings.  LSN: 7:30 PM on 12/10/2015 tPA Given: No: As stated above mRankin:  Past Medical History  Diagnosis Date  . Hypertension     a. 08/2014 Admitted with hypertensive urgency.  . Chest pain     a. 2015 Reportedly normal stress test in [place].  Marland Kitchen TIA (transient ischemic attack) 08/2014; 03/2015    a. 08/2014 in setting of hypertensive urgency.  . Anemia   . Refusal of blood transfusions as patient is Jehovah's Witness   . Hyperlipidemia   . Chronic diastolic CHF (congestive heart failure) (HCC)     a.03/2015 Echo: EF 55-60%, Gr 1 DD, mild MR, triv PR.  . Type II diabetes mellitus ([place])   . Sleep apnea   . GERD (gastroesophageal reflux disease)   . Anxiety   . Depression   . Bipolar disorder [facility])     Past Surgical History  Procedure Laterality Date  . Inguinal hernia repair Bilateral ~ 1983- ~ 1986  . Tonsillectomy  ~ 1985  . Circumcision    . Amputation Left 08/03/2015    Procedure: AMPUTATION LEFT GREAT TOE;  Surgeon: Newt Minion, MD;  Location: [facility];  Service: Orthopedics;  Laterality: Left;  . I&d extremity Left 12/02/2015    Procedure: IRRIGATION AND DEBRIDEMENT OF FOOT; LEFT SECOND TOE AMPUTATION;  Surgeon: Meredith Pel, MD;   Location: [place] [facility];  Service: Orthopedics;  Laterality: Left;    Family History  Problem Relation Age of Onset  . Hypertension Mother   . Diabetes Mother   . Hyperlipidemia Mother   . Heart disease Mother     s/p pacemaker  . Diabetes Father   . Hypertension Father   . Stroke Father   . Heart attack Father     first MI @ 56.  . Stroke Brother    Social History:  reports that he has never smoked. He has never used smokeless tobacco. He reports that he does not drink alcohol or use illicit drugs.  Allergies:  Allergies  Allergen Reactions  . Eggs Or Egg-Derived Products Diarrhea  . Other Other (See Comments)    Red meat causes stomach pains, bloating and diarrhea  . Milk-Related Compounds Diarrhea and Other (See Comments)    Any dairy products  - diarrhea and bloating    Medications: Preadmission medications were reviewed by me.  ROS: History obtained from spouse and the patient  General ROS: negative for - chills, fatigue, fever, night sweats, weight gain or weight loss Psychological ROS: negative for - behavioral disorder, hallucinations, memory difficulties, mood swings or suicidal ideation Ophthalmic ROS: negative for - blurry vision, double vision, eye pain or loss of vision ENT ROS: negative for - epistaxis, nasal discharge, oral lesions, sore throat, tinnitus or vertigo Allergy and Immunology ROS: negative for -  hives or itchy/watery eyes Hematological and Lymphatic ROS: negative for - bleeding problems, bruising or swollen lymph nodes Endocrine ROS: negative for - galactorrhea, hair pattern changes, polydipsia/polyuria or temperature intolerance Respiratory ROS: negative for - cough, hemoptysis, shortness of breath or wheezing Cardiovascular ROS: negative for - chest pain, dyspnea on exertion, edema or irregular heartbeat Gastrointestinal ROS: negative for - abdominal pain, diarrhea, hematemesis, nausea/vomiting or stool incontinence Genito-Urinary ROS: negative for -  dysuria, hematuria, incontinence or urinary frequency/urgency Musculoskeletal ROS: Continued pain and left leg and foot since great toe amputation 8 days ago Neurological ROS: as noted in HPI Dermatological ROS: negative for rash and skin lesion changes  Physical Examination: Blood pressure 115/76, pulse 80, temperature 97.7 F (36.5 C), temperature source Oral, resp. rate 16, height 5\' 8"  (1.727 m), weight 76.318 kg (168 lb 4 oz), SpO2 100 %.  HEENT-  Normocephalic, no lesions, without obvious abnormality.  Normal external eye and conjunctiva.  Normal TM's bilaterally.  Normal auditory canals and external ears. Normal external nose, mucus membranes and septum.  Normal pharynx. Neck supple with no masses, nodes, nodules or enlargement. Cardiovascular - regular rate and rhythm, S1, S2 normal, no murmur, click, rub or gallop Lungs - chest clear, no wheezing, rales, normal symmetric air entry Abdomen - soft, non-tender; bowel sounds normal; no masses,  no organomegaly Extremities - absent left great toe  Neurologic Examination: Mental Status: Alert, oriented, no acute distress.  Speech fluent without evidence of aphasia. Able to follow commands without difficulty. Cranial Nerves: II-Visual fields were normal. III/IV/VI-Pupils were equal and reacted normally to light. Extraocular movements were full and conjugate.    V/VII-reduced perception of tactile sensation over left side of the face compared to the right; no facial weakness. VIII-normal. X-normal speech and symmetrical palatal movement. XI: trapezius strength/neck flexion strength normal bilaterally XII-midline tongue extension with normal strength. Motor: Mild drift of left upper extremity; motor exam otherwise unremarkable. Sensory: Reduced perception of tactile sensation over left extremities compared to right extremities. Deep Tendon Reflexes: Trace to 1+ and symmetric. Plantars: Mute on the right Cerebellar: Normal  finger-to-nose testing.  Results for orders placed or performed during the hospital encounter of 12/10/15 (from the past 48 hour(s))  Protime-INR     Status: None   Collection Time: 12/10/15  8:39 PM  Result Value Ref Range   Prothrombin Time 14.7 11.6 - 15.2 seconds   INR 1.13 0.00 - 1.49  APTT     Status: None   Collection Time: 12/10/15  8:39 PM  Result Value Ref Range   aPTT 29 24 - 37 seconds  CBC     Status: Abnormal   Collection Time: 12/10/15  8:39 PM  Result Value Ref Range   WBC 8.1 4.0 - 10.5 K/uL   RBC 3.49 (L) 4.22 - 5.81 MIL/uL   Hemoglobin 10.1 (L) 13.0 - 17.0 g/dL   HCT 31.9 (L) 39.0 - 52.0 %   MCV 91.4 78.0 - 100.0 fL   MCH 28.9 26.0 - 34.0 pg   MCHC 31.7 30.0 - 36.0 g/dL   RDW 13.1 11.5 - 15.5 %   Platelets 429 (H) 150 - 400 K/uL  Differential     Status: None   Collection Time: 12/10/15  8:39 PM  Result Value Ref Range   Neutrophils Relative % 75 %   Neutro Abs 6.1 1.7 - 7.7 K/uL   Lymphocytes Relative 19 %   Lymphs Abs 1.6 0.7 - 4.0 K/uL   Monocytes Relative 4 %  Monocytes Absolute 0.3 0.1 - 1.0 K/uL   Eosinophils Relative 2 %   Eosinophils Absolute 0.1 0.0 - 0.7 K/uL   Basophils Relative 0 %   Basophils Absolute 0.0 0.0 - 0.1 K/uL  I-Stat Chem 8, ED     Status: Abnormal   Collection Time: 12/10/15  8:53 PM  Result Value Ref Range   Sodium 142 135 - 145 mmol/L   Potassium 4.5 3.5 - 5.1 mmol/L   Chloride 105 101 - 111 mmol/L   BUN 22 (H) 6 - 20 mg/dL   Creatinine, Ser 1.30 (H) 0.61 - 1.24 mg/dL   Glucose, Bld 98 65 - 99 mg/dL   Calcium, Ion 1.18 1.12 - 1.23 mmol/L   TCO2 25 0 - 100 mmol/L   Hemoglobin 11.6 (L) 13.0 - 17.0 g/dL   HCT 34.0 (L) 39.0 - 52.0 %  I-stat troponin, ED     Status: None   Collection Time: 12/10/15  8:58 PM  Result Value Ref Range   Troponin i, poc 0.01 0.00 - 0.08 ng/mL   Comment 3            Comment: Due to the release kinetics of cTnI, a negative result within the first hours of the onset of symptoms does not rule  out myocardial infarction with certainty. If myocardial infarction is still suspected, repeat the test at appropriate intervals.   CBG monitoring, ED     Status: None   Collection Time: 12/10/15  9:01 PM  Result Value Ref Range   Glucose-Capillary 97 65 - 99 mg/dL   Ct Head Code Stroke W/o Cm  12/10/2015  CLINICAL DATA:  45 year old male with code stroke. EXAM: CT HEAD WITHOUT CONTRAST TECHNIQUE: Contiguous axial images were obtained from the base of the skull through the vertex without intravenous contrast. COMPARISON:  Head CT dated 08/26/2015 and MRI dated 08/26/2015 FINDINGS: The ventricles and the sulci are appropriate in size for the patient's age. There is no intracranial hemorrhage. No midline shift or mass effect identified. The gray-white matter differentiation is preserved. The visualized paranasal sinuses and mastoid air cells are well aerated. The calvarium is intact. IMPRESSION: No acute intracranial pathology. These results were called by telephone at the time of interpretation on 12/10/2015 at 9:08 pm to Dr. Nicole Kindred, who verbally acknowledged these results. Electronically Signed   By: Anner Crete M.D.   On: 12/10/2015 21:16    Assessment: 45 y.o. male with multiple risk factors for stroke as well as history of TIAs resent with possible recurrent right subcortical cerebral ischemic event, TIA or possible small vessel infarction.  Stroke Risk Factors - diabetes mellitus, family history, hyperlipidemia and hypertension  Plan: 1. HgbA1c, fasting lipid panel 2. MRI, MRA  of the brain without contrast 3. PT consult, OT consult, Speech consult 4. Carotid dopplers 5. Prophylactic therapy-Antiplatelet med: Aspirin  6. Risk factor modification 7. Telemetry monitoring  C.R. Nicole Kindred, MD [facility] [phone]  12/10/2015, 9:19 PM

## 2015-12-10 NOTE — Progress Notes (Signed)
Patient admitted into [facility] 03 for stroke work up. On arrival, patient verbal and oriented. Made comfortable in bed and Safety measures put in placed. Placed on cardiac monitor and oriented to the room.

## 2015-12-10 NOTE — ED Notes (Signed)
Dr Wyvonnia Dusky asked to check this pt

## 2015-12-10 NOTE — ED Provider Notes (Signed)
CSN: [id number]     Arrival date & time 12/10/15  2010 History   First MD Initiated Contact with Patient 12/10/15 2057     Chief Complaint  Patient presents with  . Numbness  . Code Stroke   HPI Patient states that last night he began having some numbness in his left lower extremity left upper extremity. He states he has baseline weakness on the left side secondary to a stroke but that over the last hour it became worse. He arrived and a triage physician called a code stroke and he was transported to the [facility] after airway was felt to be intact. Patient denies any fevers or chills but does state that his left lower extremity is now status post amputation of his second toe and that it has had increasing pain. He denies any fevers or chills and has not removed the bandage. He is currently taking antibiotics for this.  Past Medical History  Diagnosis Date  . Hypertension     a. 08/2014 Admitted with hypertensive urgency.  . Chest pain     a. 2015 Reportedly normal stress test in [place].  Marland Kitchen TIA (transient ischemic attack) 08/2014; 03/2015    a. 08/2014 in setting of hypertensive urgency.  . Anemia   . Refusal of blood transfusions as patient is Jehovah's Witness   . Hyperlipidemia   . Chronic diastolic CHF (congestive heart failure) (HCC)     a.03/2015 Echo: EF 55-60%, Gr 1 DD, mild MR, triv PR.  . Type II diabetes mellitus ([place])   . Sleep apnea   . GERD (gastroesophageal reflux disease)   . Anxiety   . Depression   . Bipolar disorder [facility])    Past Surgical History  Procedure Laterality Date  . Inguinal hernia repair Bilateral ~ 1983- ~ 1986  . Tonsillectomy  ~ 1985  . Circumcision    . Amputation Left 08/03/2015    Procedure: AMPUTATION LEFT GREAT TOE;  Surgeon: Newt Minion, MD;  Location: [facility];  Service: Orthopedics;  Laterality: Left;  . I&d extremity Left 12/02/2015    Procedure: IRRIGATION AND DEBRIDEMENT OF FOOT; LEFT SECOND TOE AMPUTATION;  Surgeon: Meredith Pel, MD;   Location: [place];  Service: Orthopedics;  Laterality: Left;  . Amputation Left 12/02/2015    Procedure: amputation of left 2nd digit   Family History  Problem Relation Age of Onset  . Hypertension Mother   . Diabetes Mother   . Hyperlipidemia Mother   . Heart disease Mother     s/p pacemaker  . Diabetes Father   . Hypertension Father   . Stroke Father   . Heart attack Father     first MI @ 19.  . Stroke Brother    Social History  Substance Use Topics  . Smoking status: Never Smoker   . Smokeless tobacco: Never Used  . Alcohol Use: No    Review of Systems  Constitutional: Negative for fever.  Respiratory: Negative for cough and shortness of breath.   Cardiovascular: Negative for chest pain.  Genitourinary: Negative for dysuria.  Neurological: Positive for weakness, numbness and headaches. Negative for speech difficulty.  All other systems reviewed and are negative.     Allergies  Eggs or egg-derived products; Other; and Milk-related compounds  Home Medications   Prior to Admission medications   Medication Sig Start Date End Date Taking? Authorizing Provider  amLODipine (NORVASC) 5 MG tablet Take 1 tablet (5 mg total) by mouth daily. 09/04/15  Yes Dawn  Lazarus Gowda, MD  amoxicillin-clavulanate (AUGMENTIN) 875-125 MG tablet Take 1 tablet by mouth 2 (two) times daily. 12/04/15  Yes Venetia Maxon Rama, MD  aspirin EC 325 MG tablet Take 1 tablet (325 mg total) by mouth daily. 09/04/15  Yes Maren Reamer, MD  atorvastatin (LIPITOR) 80 MG tablet Take 1 tablet (80 mg total) by mouth every morning. 08/18/15  Yes Josalyn Funches, MD  carvedilol (COREG) 12.5 MG tablet TAKE 1 TABLET BY MOUTH 2 TIMES DAILY WITH A MEAL Patient taking differently: TAKE 1 TABLET BY MOUTH IN THE MORNING. 05/17/15  Yes Josalyn Funches, MD  divalproex (DEPAKOTE) 500 MG DR tablet Take 2 tablets (1,000 mg total) by mouth at bedtime. 12/04/15  Yes Venetia Maxon Rama, MD  ferrous sulfate 325 (65 FE) MG tablet Take 1  tablet (325 mg total) by mouth 2 (two) times daily with a meal. Patient taking differently: Take 325 mg by mouth daily with breakfast.  08/18/15  Yes Josalyn Funches, MD  FLUoxetine (PROZAC) 10 MG tablet Take 10 mg by mouth daily.   Yes Historical Provider, MD  gabapentin (NEURONTIN) 100 MG capsule Take 1 capsule (100 mg total) by mouth 3 (three) times daily. Patient taking differently: Take 100 mg by mouth daily.  09/04/15  Yes Dawn Lazarus Gowda, MD  glipiZIDE (GLUCOTROL) 10 MG tablet Take 1 tablet (10 mg total) by mouth 2 (two) times daily before a meal. Patient taking differently: Take 10 mg by mouth daily before breakfast.  08/18/15  Yes Josalyn Funches, MD  ibuprofen (ADVIL,MOTRIN) 800 MG tablet Take 1 tablet (800 mg total) by mouth every 8 (eight) hours as needed. Patient taking differently: Take 800 mg by mouth every 8 (eight) hours as needed for moderate pain.  11/21/15  Yes Josalyn Funches, MD  lisinopril (PRINIVIL,ZESTRIL) 40 MG tablet Take 1 tablet (40 mg total) by mouth daily. 08/18/15  Yes Boykin Nearing, MD  metFORMIN (GLUCOPHAGE) 1000 MG tablet Take 1 tablet (1,000 mg total) by mouth 2 (two) times daily with a meal. Patient taking differently: Take 1,000 mg by mouth daily with breakfast.  08/18/15  Yes Josalyn Funches, MD  omeprazole (PRILOSEC) 20 MG capsule Take 1 capsule (20 mg total) by mouth daily. 08/18/15  Yes Josalyn Funches, MD  oxyCODONE-acetaminophen (PERCOCET/ROXICET) 5-325 MG tablet Take 2 tablets by mouth every 6 (six) hours as needed for severe pain. 12/04/15  Yes Christina P Rama, MD  sulfamethoxazole-trimethoprim (BACTRIM DS,SEPTRA DS) 800-160 MG tablet Take 1 tablet by mouth 2 (two) times daily. 12/04/15  Yes Venetia Maxon Rama, MD  traZODone (DESYREL) 50 MG tablet Take 0.5-1 tablets (25-50 mg total) by mouth at bedtime as needed for sleep. 11/21/15  Yes Josalyn Funches, MD   BP 131/78 mmHg  Pulse 74  Temp(Src) 98.4 F (36.9 C) (Oral)  Resp 18  Ht 5\' 8"  (1.727 m)  Wt 76.318 kg   BMI 25.59 kg/m2  SpO2 100% Physical Exam  Constitutional: He is oriented to person, place, and time. He appears well-developed and well-nourished. No distress.  HENT:  Head: Normocephalic and atraumatic.  Left Ear: External ear normal.  Eyes: Conjunctivae are normal. Pupils are equal, round, and reactive to light. Right eye exhibits no discharge. Left eye exhibits no discharge.  Neck: Normal range of motion. Neck supple.  Cardiovascular: Normal rate and regular rhythm.   No murmur heard. Pulmonary/Chest: Effort normal and breath sounds normal. No respiratory distress.  Abdominal: Soft. Bowel sounds are normal. He exhibits no distension and no mass. There is  no tenderness. There is no rebound and no guarding.  Musculoskeletal: He exhibits no edema.  Neurological: He is alert and oriented to person, place, and time.  Endorses left sided paresthesias, weakness left > right of upper and lower extremities, able to feel x4 but states feels different on left vs right. Left facial droop (family states stable)  Skin: Skin is warm. Rash (lower extremity with first two digits missing, xeroform on second digit with well appearing wound but dorsal wounds on same foot with erythema, black tissue concerning for gangrene. no pustular discharge. <2 sec cap refill. no joint effusion. ) noted. He is not diaphoretic.  Psychiatric: He has a normal mood and affect.  Nursing note and vitals reviewed.   ED Course  Procedures (including critical care time) Labs Review Labs Reviewed  CBC - Abnormal; Notable for the following:    RBC 3.49 (*)    Hemoglobin 10.1 (*)    HCT 31.9 (*)    Platelets 429 (*)    All other components within normal limits  COMPREHENSIVE METABOLIC PANEL - Abnormal; Notable for the following:    Glucose, Bld 100 (*)    Creatinine, Ser 1.32 (*)    Albumin 3.3 (*)    ALT 16 (*)    All other components within normal limits  I-STAT CHEM 8, ED - Abnormal; Notable for the following:    BUN  22 (*)    Creatinine, Ser 1.30 (*)    Hemoglobin 11.6 (*)    HCT 34.0 (*)    All other components within normal limits  PROTIME-INR  APTT  DIFFERENTIAL  I-STAT TROPOININ, ED  CBG MONITORING, ED    Imaging Review Ct Head Code Stroke W/o Cm  12/10/2015  CLINICAL DATA:  45 year old male with code stroke. EXAM: CT HEAD WITHOUT CONTRAST TECHNIQUE: Contiguous axial images were obtained from the base of the skull through the vertex without intravenous contrast. COMPARISON:  Head CT dated 08/26/2015 and MRI dated 08/26/2015 FINDINGS: The ventricles and the sulci are appropriate in size for the patient's age. There is no intracranial hemorrhage. No midline shift or mass effect identified. The gray-white matter differentiation is preserved. The visualized paranasal sinuses and mastoid air cells are well aerated. The calvarium is intact. IMPRESSION: No acute intracranial pathology. These results were called by telephone at the time of interpretation on 12/10/2015 at 9:08 pm to Dr. Nicole Kindred, who verbally acknowledged these results. Electronically Signed   By: Anner Crete M.D.   On: 12/10/2015 21:16   I have personally reviewed and evaluated these images and lab results as part of my medical decision-making.   EKG Interpretation None      MDM   Final diagnoses:  Numbness  Pain of toe of left foot    Code stroke. MRI negative for CVA. Neurology has seen and believes TIA versus migraine given headache. Will admit for observation. Labs reviewed with mild renal insufficiency. Patient does have what appears to be cellulitis with indeterminate gangrene of his lower extremities status post amputation on the dorsal aspect. Patient is hemodynamically stable however and his white count is decreasing with outpatient antibiotics. He does not appear septic. Recent MRI negative for osteomyelitis. Likely requires close monitoring and reevaluation by surgical service tomorrow. Patient will be admitted for  further monitoring and evaluation.    Karma Greaser, MD 12/10/15 2257  Noemi Chapel, MD 12/12/15 [phone]

## 2015-12-10 NOTE — ED Notes (Signed)
CBG is 97.

## 2015-12-10 NOTE — H&P (Signed)
TRH H&P   Patient Demographics:    Shane [name], is a 45 y.o. male  MRN: [id number]   DOB - 11/20/1970  Admit Date - 12/10/2015  Outpatient Primary MD for the patient is Minerva Ends, MD  Referring MD/NP/PA:  Noemi Chapel  Outpatient Specialists: Alphon[facility] Severance  Patient coming from: home  Chief Complaint  Patient presents with  . Numbness  . Code Stroke      HPI:    Shane [name]  is a 45 y.o. male, w c/o left sided pain, weakness, numbness,  Starting last nite on the left leg and then radiated up towards chest and face. Pt presented to ED for evaluation.    In ED, CT brain negative.  Neurology evaluated patient and requested medical admission for TIA/ Migraine.  Pt noted ot have Hgb 11.6, and mild renal insufficiency w creatinine 1.3    Review of systems:    In addition to the HPI above,  No Fever-chills, , No changes hearing, slight blurred vision left eye, + headache left frontal radiating to the back.  No problems swallowing food or Liquids, No Chest pain, Cough or Shortness of Breath, No Abdominal pain, No Nausea or Vommitting, Bowel movements are regular, No Blood in stool or Urine, No dysuria, No new skin rashes or bruises, No new joints pains-aches,  No recent weight gain or loss, No polyuria, polydypsia or polyphagia, No significant Mental Stressors.  A full 10 point Review of Systems was done, except as stated above, all other Review of Systems were negative.   With Past History of the following :    Past Medical History  Diagnosis Date  . Hypertension     a. 08/2014 Admitted with hypertensive urgency.  . Chest pain     a. 2015 Reportedly normal stress test in [place].  Marland Kitchen TIA (transient ischemic attack) 08/2014; 03/2015    a. 08/2014 in setting of hypertensive urgency.  . Anemia   . Refusal of blood transfusions as patient is Jehovah's Witness   .  Hyperlipidemia   . Chronic diastolic CHF (congestive heart failure) (HCC)     a.03/2015 Echo: EF 55-60%, Gr 1 DD, mild MR, triv PR.  . Type II diabetes mellitus ([place])   . Sleep apnea   . GERD (gastroesophageal reflux disease)   . Anxiety   . Depression   . Bipolar disorder [facility])       Past Surgical History  Procedure Laterality Date  . Inguinal hernia repair Bilateral ~ 1983- ~ 1986  . Tonsillectomy  ~ 1985  . Circumcision    . Amputation Left 08/03/2015    Procedure: AMPUTATION LEFT GREAT TOE;  Surgeon: Newt Minion, MD;  Location: [facility];  Service: Orthopedics;  Laterality: Left;  . I&d extremity Left 12/02/2015    Procedure: IRRIGATION AND DEBRIDEMENT OF FOOT; LEFT SECOND TOE AMPUTATION;  Surgeon: Tonna Corner  Marlou Sa, MD;  Location: [facility];  Service: Orthopedics;  Laterality: Left;  . Amputation Left 12/02/2015    Procedure: amputation of left 2nd digit      Social History:     Social History  Substance Use Topics  . Smoking status: Never Smoker   . Smokeless tobacco: Never Used  . Alcohol Use: No     Lives - at home  Mobility -  Ambulates , walking with walker   Family History :     Family History  Problem Relation Age of Onset  . Hypertension Mother   . Diabetes Mother   . Hyperlipidemia Mother   . Heart disease Mother     s/p pacemaker  . Diabetes Father   . Hypertension Father   . Stroke Father   . Heart attack Father     first MI @ 68.  . Stroke Brother       Home Medications:   Prior to Admission medications   Medication Sig Start Date End Date Taking? Authorizing Provider  amLODipine (NORVASC) 5 MG tablet Take 1 tablet (5 mg total) by mouth daily. 09/04/15   Maren Reamer, MD  amoxicillin-clavulanate (AUGMENTIN) 875-125 MG tablet Take 1 tablet by mouth 2 (two) times daily. 12/04/15   Venetia Maxon Rama, MD  aspirin EC 325 MG tablet Take 1 tablet (325 mg total) by mouth daily. 09/04/15   Maren Reamer, MD  atorvastatin (LIPITOR) 80 MG tablet Take  1 tablet (80 mg total) by mouth every morning. 08/18/15   Josalyn Funches, MD  carvedilol (COREG) 12.5 MG tablet TAKE 1 TABLET BY MOUTH 2 TIMES DAILY WITH A MEAL 05/17/15   Josalyn Funches, MD  divalproex (DEPAKOTE) 500 MG DR tablet Take 2 tablets (1,000 mg total) by mouth at bedtime. 12/04/15   Venetia Maxon Rama, MD  ferrous sulfate 325 (65 FE) MG tablet Take 1 tablet (325 mg total) by mouth 2 (two) times daily with a meal. 08/18/15   Josalyn Funches, MD  FLUoxetine (PROZAC) 10 MG tablet Take 10 mg by mouth daily.    Historical Provider, MD  gabapentin (NEURONTIN) 100 MG capsule Take 1 capsule (100 mg total) by mouth 3 (three) times daily. 09/04/15   Maren Reamer, MD  glipiZIDE (GLUCOTROL) 10 MG tablet Take 1 tablet (10 mg total) by mouth 2 (two) times daily before a meal. 08/18/15   Josalyn Funches, MD  ibuprofen (ADVIL,MOTRIN) 800 MG tablet Take 1 tablet (800 mg total) by mouth every 8 (eight) hours as needed. 11/21/15   Josalyn Funches, MD  lisinopril (PRINIVIL,ZESTRIL) 40 MG tablet Take 1 tablet (40 mg total) by mouth daily. 08/18/15   Boykin Nearing, MD  metFORMIN (GLUCOPHAGE) 1000 MG tablet Take 1 tablet (1,000 mg total) by mouth 2 (two) times daily with a meal. 08/18/15   Josalyn Funches, MD  omeprazole (PRILOSEC) 20 MG capsule Take 1 capsule (20 mg total) by mouth daily. 08/18/15   Boykin Nearing, MD  oxyCODONE-acetaminophen (PERCOCET/ROXICET) 5-325 MG tablet Take 2 tablets by mouth every 6 (six) hours as needed for severe pain. 12/04/15   Venetia Maxon Rama, MD  sulfamethoxazole-trimethoprim (BACTRIM DS,SEPTRA DS) 800-160 MG tablet Take 1 tablet by mouth 2 (two) times daily. 12/04/15   Venetia Maxon Rama, MD  traZODone (DESYREL) 50 MG tablet Take 0.5-1 tablets (25-50 mg total) by mouth at bedtime as needed for sleep. 11/21/15   Boykin Nearing, MD     Allergies:     Allergies  Allergen Reactions  .  Eggs Or Egg-Derived Products Diarrhea  . Other Other (See Comments)    Red meat causes stomach pains,  bloating and diarrhea  . Milk-Related Compounds Diarrhea and Other (See Comments)    Any dairy products  - diarrhea and bloating     Physical Exam:   Vitals  Blood pressure 141/87, pulse 76, temperature 98.4 F (36.9 C), temperature source Oral, resp. rate 16, height 5\' 8"  (1.727 m), weight 76.318 kg (168 lb 4 oz), SpO2 99 %.   1. General  lying in bed in NAD,    2. Normal affect and insight, Not Suicidal or Homicidal, Awake Alert, Oriented X 3.  3. No F.N deficits, ALL C.Nerves Intact, Strength 5/5 all 4 extremities, Sensation intact all 4 extremities, Plantars down going.  4. Ears and Eyes appear Normal, Conjunctivae clear, PERRLA. Moist Oral Mucosa.  5. Supple Neck, No JVD, No cervical lymphadenopathy appriciated, No Carotid Bruits.  6. Symmetrical Chest wall movement, Good air movement bilaterally, CTAB.  7. RRR, No Gallops, Rubs or Murmurs, No Parasternal Heave.  8. Positive Bowel Sounds, Abdomen Soft, No tenderness, No organomegaly appriciated,No rebound -guarding or rigidity.  9.  No Cyanosis, Normal Skin Turgor, No Skin Rash or Bruise.  10. Good muscle tone,  joints appear normal , no effusions, Normal ROM.  11. No Palpable Lymph Nodes in Neck or Axillae     Data Review:    CBC  Recent Labs Lab 12/10/15 2039 12/10/15 2053  WBC 8.1  --   HGB 10.1* 11.6*  HCT 31.9* 34.0*  PLT 429*  --   MCV 91.4  --   MCH 28.9  --   MCHC 31.7  --   RDW 13.1  --   LYMPHSABS 1.6  --   MONOABS 0.3  --   EOSABS 0.1  --   BASOSABS 0.0  --    ------------------------------------------------------------------------------------------------------------------  Chemistries   Recent Labs Lab 12/10/15 2039 12/10/15 2053  NA 137 142  K 4.3 4.5  CL 106 105  CO2 23  --   GLUCOSE 100* 98  BUN 20 22*  CREATININE 1.32* 1.30*  CALCIUM 9.0  --   AST 17  --   ALT 16*  --   ALKPHOS 49  --   BILITOT 0.4  --     ------------------------------------------------------------------------------------------------------------------ estimated creatinine clearance is 68.7 mL/min (by C-G formula based on Cr of 1.3). ------------------------------------------------------------------------------------------------------------------ No results for input(s): TSH, T4TOTAL, T3FREE, THYROIDAB in the last 72 hours.  Invalid input(s): FREET3  Coagulation profile  Recent Labs Lab 12/10/15 2039  INR 1.13   ------------------------------------------------------------------------------------------------------------------- No results for input(s): DDIMER in the last 72 hours. -------------------------------------------------------------------------------------------------------------------  Cardiac Enzymes No results for input(s): CKMB, TROPONINI, MYOGLOBIN in the last 168 hours.  Invalid input(s): CK ------------------------------------------------------------------------------------------------------------------    Component Value Date/Time   BNP 18.2 12/01/2015 0801     ---------------------------------------------------------------------------------------------------------------  Urinalysis    Component Value Date/Time   COLORURINE YELLOW 04/24/2015 1056   APPEARANCEUR CLEAR 04/24/2015 1056   LABSPEC 1.013 04/24/2015 1056   PHURINE 5.0 04/24/2015 1056   GLUCOSEU NEGATIVE 04/24/2015 1056   HGBUR MODERATE* 04/24/2015 1056   BILIRUBINUR NEGATIVE 04/24/2015 1056   KETONESUR NEGATIVE 04/24/2015 1056   PROTEINUR NEGATIVE 04/24/2015 1056   UROBILINOGEN 0.2 04/24/2015 1056   NITRITE NEGATIVE 04/24/2015 1056   LEUKOCYTESUR NEGATIVE 04/24/2015 1056    ----------------------------------------------------------------------------------------------------------------   Imaging Results:    Ct Head Code Stroke W/o Cm  12/10/2015  CLINICAL DATA:  45 year old male with code stroke.  EXAM: CT HEAD WITHOUT  CONTRAST TECHNIQUE: Contiguous axial images were obtained from the base of the skull through the vertex without intravenous contrast. COMPARISON:  Head CT dated 08/26/2015 and MRI dated 08/26/2015 FINDINGS: The ventricles and the sulci are appropriate in size for the patient's age. There is no intracranial hemorrhage. No midline shift or mass effect identified. The gray-white matter differentiation is preserved. The visualized paranasal sinuses and mastoid air cells are well aerated. The calvarium is intact. IMPRESSION: No acute intracranial pathology. These results were called by telephone at the time of interpretation on 12/10/2015 at 9:08 pm to Dr. Nicole Kindred, who verbally acknowledged these results. Electronically Signed   By: Anner Crete M.D.   On: 12/10/2015 21:16       Assessment & Plan:    Active Problems:   TIA (transient ischemic attack)   DM2 (diabetes mellitus, type 2) (HCC)   Headache   Anemia   Nausea & vomiting    1. Numbness, weakness, ? TIA vs atypical migraine Aspirin, statin,  Check MRI brain Check carotid ultrasound Check cardiac 2d echo Appreciate neurology input.   2. N/v Zofran  protonix  3.  Anemia Check cbc in am  4. Renal insufficiency Hydrate with ns iv Check cmp in am  5.  Dm2 fsbs ac and qhs, iss.   6. Amputation  DVT Prophylaxis  SCDs   AM Labs Ordered, also please review Full Orders  Family Communication: Admission, patients condition and plan of care including tests being ordered have been discussed with the patient who indicate understanding and agree with the plan and Code Status.  Code Status FULL CODE  Likely DC to  home  Condition GUARDED    Consults called: neurology by ED  Admission status: observation  Time spent in minutes : 84 minute   Jani Gravel M.D on 12/10/2015 at 9:50 PM  Between 7am to 7pm - Pager - [phone]. After 7pm go to [email or web address] - password [email or web address]  [facility] - Office  [phone]

## 2015-12-10 NOTE — ED Notes (Signed)
The pt is c/o some lt sided  Numbness since last night intermittently  He has numbness form his lt foot all the way up his entire lt side since  Last night.  He is now c/o lt chest [aon  He had his lt 2nd toe removed last Saturday also c/o a headache he  Has equal frips  No arm or leg drift

## 2015-12-11 ENCOUNTER — Observation Stay (HOSPITAL_BASED_OUTPATIENT_CLINIC_OR_DEPARTMENT_OTHER): Payer: Medicaid Other

## 2015-12-11 ENCOUNTER — Observation Stay (HOSPITAL_COMMUNITY): Payer: Medicaid Other

## 2015-12-11 DIAGNOSIS — G459 Transient cerebral ischemic attack, unspecified: Secondary | ICD-10-CM

## 2015-12-11 DIAGNOSIS — E1100 Type 2 diabetes mellitus with hyperosmolarity without nonketotic hyperglycemic-hyperosmolar coma (NKHHC): Secondary | ICD-10-CM

## 2015-12-11 DIAGNOSIS — R200 Anesthesia of skin: Secondary | ICD-10-CM

## 2015-12-11 DIAGNOSIS — M79675 Pain in left toe(s): Secondary | ICD-10-CM | POA: Insufficient documentation

## 2015-12-11 DIAGNOSIS — D508 Other iron deficiency anemias: Secondary | ICD-10-CM

## 2015-12-11 DIAGNOSIS — R1110 Vomiting, unspecified: Secondary | ICD-10-CM

## 2015-12-11 LAB — LIPID PANEL
Cholesterol: 107 mg/dL (ref 0–200)
HDL: 23 mg/dL — AB (ref >40)
LDL CALC: 58 mg/dL (ref 0–99)
TRIGLYCERIDES: 129 mg/dL (ref <150)
Total CHOL/HDL Ratio: 4.7 RATIO
VLDL: 26 mg/dL (ref 0–40)

## 2015-12-11 LAB — GLUCOSE, CAPILLARY
GLUCOSE-CAPILLARY: 140 mg/dL — AB (ref 65–99)
GLUCOSE-CAPILLARY: 183 mg/dL — AB (ref 65–99)
GLUCOSE-CAPILLARY: 124 mg/dL — AB (ref 65–99)
Glucose-Capillary: 90 mg/dL (ref 65–99)

## 2015-12-11 LAB — VAS US CAROTID
LCCAPDIAS: 18 cm/s
LCCAPSYS: 83 cm/s
LEFT ECA DIAS: -9 cm/s
LEFT VERTEBRAL DIAS: -5 cm/s
Left CCA dist dias: -27 cm/s
Left CCA dist sys: -77 cm/s
Left ICA dist dias: -24 cm/s
Left ICA dist sys: -64 cm/s
Left ICA prox dias: -26 cm/s
Left ICA prox sys: -71 cm/s
RCCAPDIAS: 21 cm/s
RIGHT ECA DIAS: -10 cm/s
RIGHT VERTEBRAL DIAS: -15 cm/s
Right CCA prox sys: 88 cm/s
Right cca dist sys: -77 cm/s

## 2015-12-11 LAB — ECHOCARDIOGRAM COMPLETE
CHL CUP MV DEC (S): 201
E/e' ratio: 17.68
EWDT: 201 ms
FS: 36 % (ref 28–44)
Height: 68 in
IV/PV OW: 1.18
LA ID, A-P, ES: 37 mm
LA diam end sys: 37 mm
LA vol A4C: 73.4 ml
LADIAMINDEX: 1.88 cm/m2
LAVOL: 61.7 mL
LAVOLIN: 31.4 mL/m2
LV E/e' medial: 17.68
LVEEAVG: 17.68
LVELAT: 5.77 cm/s
LVOT area: 3.8 cm2
LVOTD: 22 mm
Lateral S' vel: 13.2 cm/s
MV Peak grad: 4 mmHg
MV pk A vel: 87.8 m/s
MVPKEVEL: 102 m/s
PW: 11 mm — AB (ref 0.6–1.1)
TDI e' lateral: 5.77
TDI e' medial: 6.53
Weight: 2800.72 oz

## 2015-12-11 MED ORDER — ONDANSETRON HCL 4 MG/2ML IJ SOLN
4.0000 mg | Freq: Four times a day (QID) | INTRAMUSCULAR | Status: DC | PRN
  Administered 2015-12-11 – 2015-12-12 (×3): 4 mg via INTRAVENOUS
  Filled 2015-12-11 (×3): qty 2

## 2015-12-11 MED ORDER — FLUOXETINE HCL 10 MG PO CAPS
10.0000 mg | ORAL_CAPSULE | Freq: Every day | ORAL | Status: DC
  Administered 2015-12-11 – 2015-12-12 (×2): 10 mg via ORAL
  Filled 2015-12-11 (×2): qty 1

## 2015-12-11 MED ORDER — HEPARIN SODIUM (PORCINE) 5000 UNIT/ML IJ SOLN
5000.0000 [IU] | Freq: Three times a day (TID) | INTRAMUSCULAR | Status: DC
  Administered 2015-12-11 – 2015-12-12 (×3): 5000 [IU] via SUBCUTANEOUS
  Filled 2015-12-11 (×3): qty 1

## 2015-12-11 NOTE — Progress Notes (Signed)
[facility] PROGRESS NOTE  Shane [name] [id number] DOB: [birth year] DOA: 12/10/2015   PCP: Minerva Ends, MD     Assessment/Plan: Active Problems:   TIA (transient ischemic attack)   DM2 (diabetes mellitus, type 2) (HCC)   Headache   Anemia   Nausea & vomiting   Numbness   Pain of toe of left foot   Shane [name] is a 44 y.o. Male with history of hypertension, hyperlipidemia, diabetes mellitus, heart failure and TIAs presenting with recurrent acute left side numbness and left extremity weakness, w c/o left sided pain, weakness, numbness, Starting last nite on the left leg and then radiated up towards chest and face. Pt presented to ED for evaluation.  In ED, CT brain negative. Neurology evaluated patient and requested medical admission for TIA/ Migraine. Pt noted ot have Hgb 11.6, and mild renal insufficiency w creatinine 1.3   Assessment and plan TIA/possible recurrent right subcortical cerebral ischemic event 1. HgbA1c pending, fasting lipid panel shows LDL of 58, triglycerides 129 2. MRI, MRA of the brain without contrast negative for acute CVA 3. PT consult, OT consult,-home health Speech consult pending 4. Carotid dopplers pending 5. Prophylactic therapy-Antiplatelet med: Aspirin  6. Risk factor modification 7. Telemetry monitoring-NSR   Left foot cellulitis/abscess with pain/Diabetic foot infection Dr. Marlou Sa of orthopedic surgery on  12/02/15 did  irrigation and debridement of the foot with the left second toe amputation.  Was Treated with Rocephin, Flagyl and vancomycin while in the hospital,   discharged home on an additional 5 days of treatment with Augmentin/Septra, which he is still  receiving , WBC nl  Dr. Marlou Sa to reevaluate  DM-II Last A1c 7.9 on 11/21/15, not well controled. Patient is taking metformin and glipizide at home. Managed with insulin sensitive SSI while in the hospital. Resume usual treatment at discharge with close follow-up  with PCP to assess glycemic control which may have been suboptimal secondary to infection.  HLD   Continue home medications: Lipitor.  HTN: Continue Coreg, amlodipine, lisinopril.  GERD Continue Protonix.  Depression with anxiety and bipolar Stable, no suicidal or homicidal ideations. Continue home medications: Prozac and Depakote.  Chronic diastolic congestive heart failure 2-D echo 04/03/15 showed EF of 55% with grade 1 diastolic dysfunction. Patient is not taking diuretics at home. Continue aspirin and Coreg. Chest x-ray clear.   DVT prophylaxsis heparin  Code Status: Full code  Family Communication: Discussed in detail with the patient, all imaging results, lab results explained to the patient   Disposition Plan:  Anticipate discharge tomorrow      Consultants:  None   Procedures None  Antibiotics: Anti-infectives    Start     Dose/Rate Route Frequency Ordered Stop   12/10/15 2330  amoxicillin-clavulanate (AUGMENTIN) 875-125 MG per tablet 1 tablet     1 tablet Oral 2 times daily 12/10/15 2318     12/10/15 2330  sulfamethoxazole-trimethoprim (BACTRIM DS,SEPTRA DS) 800-160 MG per tablet 1 tablet     1 tablet Oral 2 times daily 12/10/15 2318           HPI/Subjective: Increased pain and drainage from the left to amputation site  Objective: Filed Vitals:   12/11/15 0520 12/11/15 0730 12/11/15 0929 12/11/15 1129  BP: 130/72 128/76 132/80 148/88  Pulse: 93 82 76 78  Temp: 98.6 F (37 C) 98.2 F (36.8 C) 98 F (36.7 C) 97.8 F (36.6 C)  TempSrc: Oral Oral Oral Oral  Resp: 20 20 18  18  Height:      Weight:      SpO2: 98% 99% 98% 99%    Intake/Output Summary (Last 24 hours) at 12/11/15 1241 Last data filed at 12/11/15 0120  Gross per 24 hour  Intake      0 ml  Output    300 ml  Net   -300 ml    Exam:  Examination:  General exam: Appears calm and comfortable  Respiratory system: Clear to auscultation. Respiratory effort  normal. Cardiovascular system: S1 & S2 heard, RRR. No JVD, murmurs, rubs, gallops or clicks. No pedal edema. Gastrointestinal system: Abdomen is nondistended, soft and nontender. No organomegaly or masses felt. Normal bowel sounds heard. Central nervous system: Alert and oriented. No focal neurological deficits. Extremities: Symmetric 5 x 5 power. Skin: No rashes, lesions or ulcers Psychiatry: Judgement and insight appear normal. Mood & affect appropriate.     Data Reviewed: I have personally reviewed following labs and imaging studies  Micro Results Recent Results (from the past 240 hour(s))  Aerobic/Anaerobic Culture (surgical/deep wound)     Status: None   Collection Time: 12/02/15  5:16 PM  Result Value Ref Range Status   Specimen Description WOUND  Final   Special Requests LEFT SECOND TOE  Final   Gram Stain   Final    RARE WBC PRESENT,BOTH PMN AND MONONUCLEAR NO ORGANISMS SEEN    Culture No growth aerobically or anaerobically.  Final   Report Status 12/07/2015 FINAL  Final    Radiology Reports Dg Chest 2 View  12/11/2015  CLINICAL DATA:  TIA EXAM: CHEST  2 VIEW COMPARISON:  12/01/2015 FINDINGS: Heart and mediastinal contours are within normal limits. No focal opacities or effusions. No acute bony abnormality. IMPRESSION: No active cardiopulmonary disease. Electronically Signed   By: Rolm Baptise M.D.   On: 12/11/2015 07:40   Dg Chest 2 View  12/01/2015  CLINICAL DATA:  Chest pain for couple of hours. EXAM: CHEST  2 VIEW COMPARISON:  08/26/2015 FINDINGS: The heart size and mediastinal contours are within normal limits. Both lungs are clear. The visualized skeletal structures are unremarkable. IMPRESSION: No active cardiopulmonary disease. Electronically Signed   By: Lucienne Capers M.D.   On: 12/01/2015 01:37   Mr Foot Left Wo Contrast  12/02/2015  CLINICAL DATA:  Left foot pain and left second toe swelling for 2 weeks. Fever and chills. Question abscess or osteomyelitis.  EXAM: MRI OF THE LEFT FOREFOOT WITHOUT CONTRAST TECHNIQUE: Multiplanar, multisequence MR imaging was performed. No intravenous contrast was administered. COMPARISON:  Plain films left foot 11/30/2015. MRI left foot 08/02/2015. FINDINGS: Amputation of the great toe is identified as seen on comparison plain films. Marked soft tissue edema is seen over the dorsum of the foot. A large area of heterogeneous signal abnormality is seen in the subcutaneous soft tissues over the dorsum of the foot. This area measures up to 4.4 cm transverse at the level of the distal metatarsals by 1.8 cm craniocaudal by 5.6 cm long. Smaller areas of similar signal abnormality are seen extending to the level of the mid foot. The coronal STIR sequence demonstrates mild appearing edema distal phalanx of the second toe but this cannot be confirmed on other sequences. Bone marrow signal is otherwise unremarkable. IMPRESSION: Intense edema over the dorsum of the foot is compatible with cellulitis. Heterogeneous areas of signal abnormality in the dorsum of the foot is worrisome for abscess. Mildly increased T2 signal in the coronal STIR sequence in the distal phalanx of the  second toe cannot be confirmed on other sequences. No convincing evidence of osteomyelitis is identified. Electronically Signed   By: Inge Rise M.D.   On: 12/02/2015 08:43   Dg Foot 2 Views Left  11/22/2015  CLINICAL DATA:  Left foot pain/ swelling at site of great toe amputation EXAM: LEFT FOOT - 2 VIEW COMPARISON:  08/13/2015 FINDINGS: Status post left 1st digit amputation at the MTP joint. No fracture or dislocation is seen. The joint spaces are preserved. Mild soft tissue swelling overlying the 1st digit. No radiopaque foreign body is seen. IMPRESSION: Mild soft tissue swelling at the site of 1st digit amputation. No radiopaque foreign body is seen. No fracture or dislocation is seen. Electronically Signed   By: Julian Hy M.D.   On: 11/22/2015 09:36   Dg  Foot Complete Left  12/01/2015  CLINICAL DATA:  Left foot wound. Diabetes. Second toe nail fell off 5 days ago. EXAM: LEFT FOOT - COMPLETE 3+ VIEW COMPARISON:  11/21/2015 FINDINGS: Previous amputation of the left first toe at the metatarsal-phalangeal joint level. Soft tissues at the site of resection or prominent but unchanged since previous study. No radiopaque soft tissue foreign bodies or gas collections. Vascular calcifications. No bone erosion or sclerosis to suggest osteomyelitis. No acute fracture or dislocation. IMPRESSION: Previous amputation of the left first toe. No evidence of osteomyelitis. Electronically Signed   By: Lucienne Capers M.D.   On: 12/01/2015 00:27   Ct Head Code Stroke W/o Cm  12/10/2015  CLINICAL DATA:  45 year old male with code stroke. EXAM: CT HEAD WITHOUT CONTRAST TECHNIQUE: Contiguous axial images were obtained from the base of the skull through the vertex without intravenous contrast. COMPARISON:  Head CT dated 08/26/2015 and MRI dated 08/26/2015 FINDINGS: The ventricles and the sulci are appropriate in size for the patient's age. There is no intracranial hemorrhage. No midline shift or mass effect identified. The gray-white matter differentiation is preserved. The visualized paranasal sinuses and mastoid air cells are well aerated. The calvarium is intact. IMPRESSION: No acute intracranial pathology. These results were called by telephone at the time of interpretation on 12/10/2015 at 9:08 pm to Dr. Nicole Kindred, who verbally acknowledged these results. Electronically Signed   By: Anner Crete M.D.   On: 12/10/2015 21:16     CBC  Recent Labs Lab 12/10/15 2039 12/10/15 2053  WBC 8.1  --   HGB 10.1* 11.6*  HCT 31.9* 34.0*  PLT 429*  --   MCV 91.4  --   MCH 28.9  --   MCHC 31.7  --   RDW 13.1  --   LYMPHSABS 1.6  --   MONOABS 0.3  --   EOSABS 0.1  --   BASOSABS 0.0  --     Chemistries   Recent Labs Lab 12/10/15 2039 12/10/15 2053  NA 137 142  K 4.3  4.5  CL 106 105  CO2 23  --   GLUCOSE 100* 98  BUN 20 22*  CREATININE 1.32* 1.30*  CALCIUM 9.0  --   AST 17  --   ALT 16*  --   ALKPHOS 49  --   BILITOT 0.4  --    ------------------------------------------------------------------------------------------------------------------ estimated creatinine clearance is 68.7 mL/min (by C-G formula based on Cr of 1.3). ------------------------------------------------------------------------------------------------------------------ No results for input(s): HGBA1C in the last 72 hours. ------------------------------------------------------------------------------------------------------------------  Recent Labs  12/11/15 0830  CHOL 107  HDL 23*  LDLCALC 58  TRIG 129  CHOLHDL 4.7   ------------------------------------------------------------------------------------------------------------------ No results for  input(s): TSH, T4TOTAL, T3FREE, THYROIDAB in the last 72 hours.  Invalid input(s): FREET3 ------------------------------------------------------------------------------------------------------------------ No results for input(s): VITAMINB12, FOLATE, FERRITIN, TIBC, IRON, RETICCTPCT in the last 72 hours.  Coagulation profile  Recent Labs Lab 12/10/15 2039  INR 1.13    No results for input(s): DDIMER in the last 72 hours.  Cardiac Enzymes No results for input(s): CKMB, TROPONINI, MYOGLOBIN in the last 168 hours.  Invalid input(s): CK ------------------------------------------------------------------------------------------------------------------ Invalid input(s): POCBNP   CBG:  Recent Labs Lab 12/10/15 2101 12/10/15 2349 12/11/15 0620 12/11/15 1140  GLUCAP 97 117* 140* 183*       Studies: Dg Chest 2 View  12/11/2015  CLINICAL DATA:  TIA EXAM: CHEST  2 VIEW COMPARISON:  12/01/2015 FINDINGS: Heart and mediastinal contours are within normal limits. No focal opacities or effusions. No acute bony abnormality.  IMPRESSION: No active cardiopulmonary disease. Electronically Signed   By: Rolm Baptise M.D.   On: 12/11/2015 07:40   Ct Head Code Stroke W/o Cm  12/10/2015  CLINICAL DATA:  45 year old male with code stroke. EXAM: CT HEAD WITHOUT CONTRAST TECHNIQUE: Contiguous axial images were obtained from the base of the skull through the vertex without intravenous contrast. COMPARISON:  Head CT dated 08/26/2015 and MRI dated 08/26/2015 FINDINGS: The ventricles and the sulci are appropriate in size for the patient's age. There is no intracranial hemorrhage. No midline shift or mass effect identified. The gray-white matter differentiation is preserved. The visualized paranasal sinuses and mastoid air cells are well aerated. The calvarium is intact. IMPRESSION: No acute intracranial pathology. These results were called by telephone at the time of interpretation on 12/10/2015 at 9:08 pm to Dr. Nicole Kindred, who verbally acknowledged these results. Electronically Signed   By: Anner Crete M.D.   On: 12/10/2015 21:16      Lab Results  Component Value Date   HGBA1C 7.9 11/21/2015   HGBA1C 7.1 08/18/2015   HGBA1C 8.7* 04/24/2015   Lab Results  Component Value Date   MICROALBUR 9.4 04/07/2015   LDLCALC 58 12/11/2015   CREATININE 1.30* 12/10/2015       Scheduled Meds: . amLODipine  5 mg Oral Daily  . amoxicillin-clavulanate  1 tablet Oral BID  . aspirin EC  325 mg Oral Daily  . atorvastatin  80 mg Oral q morning - 10a  . carvedilol  12.5 mg Oral BID WC  . divalproex  1,000 mg Oral QHS  . ferrous sulfate  325 mg Oral BID WC  . FLUoxetine  10 mg Oral Daily  . gabapentin  100 mg Oral TID  . glipiZIDE  10 mg Oral BID AC  . insulin aspart  0-5 Units Subcutaneous QHS  . insulin aspart  0-9 Units Subcutaneous TID WC  . lisinopril  40 mg Oral Daily  . metFORMIN  1,000 mg Oral BID WC  . pantoprazole  40 mg Oral Daily  . sulfamethoxazole-trimethoprim  1 tablet Oral BID   Continuous Infusions:       Time  spent: >30 MINS    [facility]  [facility] Pager [phone]. If 7PM-7AM, please contact night-coverage at [email or web address], password [facility] 12/11/2015, 12:41 PM

## 2015-12-11 NOTE — Progress Notes (Addendum)
STROKE TEAM PROGRESS NOTE   HISTORY OF PRESENT ILLNESS (per record) Shane [name] is an 45 y.o. male with a history of hypertension, hyperlipidemia, diabetes mellitus, heart failure and TIAs presenting with recurrent acute left side numbness and left extremity weakness. Patient is also complaining of headache. Sensory symptoms have been occurring intermittently since 12/09/2015. He was last known well at 7:45 PM tonight 12/10/2015. Speech has remained unchanged. No facial droop noted. He's been taking aspirin daily. He status post left great toe amputation 8 days ago. CT scan of his head showed no acute intracranial abnormality. NIH stroke score was 3. He was not considered a candidate for lytic therapy with IV TPA because of minimal clinical findings. He was admitted for further evaluation and treatment.   SUBJECTIVE (INTERVAL HISTORY) He presented with left foot pain spreading upand into chest followed by left hemibody numbness which has persisted   OBJECTIVE Temp:  [97.7 F (36.5 C)-98.6 F (37 C)] 97.8 F (36.6 C) (06/26 1129) Pulse Rate:  [73-93] 78 (06/26 1129) Cardiac Rhythm:  [-] Normal sinus rhythm (06/26 0803) Resp:  [16-22] 18 (06/26 1129) BP: (115-148)/(72-88) 148/88 mmHg (06/26 1129) SpO2:  [97 %-100 %] 99 % (06/26 1129) Weight:  [76.318 kg (168 lb 4 oz)-79.4 kg (175 lb 0.7 oz)] 79.4 kg (175 lb 0.7 oz) (06/26 0000)  CBC:  Recent Labs Lab 12/10/15 2039 12/10/15 2053  WBC 8.1  --   NEUTROABS 6.1  --   HGB 10.1* 11.6*  HCT 31.9* 34.0*  MCV 91.4  --   PLT 429*  --     Basic Metabolic Panel:  Recent Labs Lab 12/10/15 2039 12/10/15 2053  NA 137 142  K 4.3 4.5  CL 106 105  CO2 23  --   GLUCOSE 100* 98  BUN 20 22*  CREATININE 1.32* 1.30*  CALCIUM 9.0  --     Lipid Panel:    Component Value Date/Time   CHOL 107 12/11/2015 0830   TRIG 129 12/11/2015 0830   HDL 23* 12/11/2015 0830   CHOLHDL 4.7 12/11/2015 0830   VLDL 26 12/11/2015 0830   LDLCALC 58 12/11/2015  0830   HgbA1c:  Lab Results  Component Value Date   HGBA1C 7.9 11/21/2015   Urine Drug Screen:    Component Value Date/Time   LABOPIA POSITIVE* 04/02/2015 1713   COCAINSCRNUR NONE DETECTED 04/02/2015 1713   LABBENZ NONE DETECTED 04/02/2015 1713   AMPHETMU NONE DETECTED 04/02/2015 1713   THCU NONE DETECTED 04/02/2015 1713   LABBARB NONE DETECTED 04/02/2015 1713      IMAGING  Dg Chest 2 View  12/11/2015  CLINICAL DATA:  TIA EXAM: CHEST  2 VIEW COMPARISON:  12/01/2015 FINDINGS: Heart and mediastinal contours are within normal limits. No focal opacities or effusions. No acute bony abnormality. IMPRESSION: No active cardiopulmonary disease. Electronically Signed   By: Rolm Baptise M.D.   On: 12/11/2015 07:40   Mr Brain Wo Contrast  12/11/2015  CLINICAL DATA:  Recurrent acute LEFT-sided numbness and weakness. Headache. EXAM: MRI HEAD WITHOUT CONTRAST TECHNIQUE: Multiplanar, multiecho pulse sequences of the brain and surrounding structures were obtained without intravenous contrast. COMPARISON:  None. FINDINGS: No evidence for acute infarction, hemorrhage, mass lesion, hydrocephalus, or extra-axial fluid. Normal cerebral volume. No significant white matter disease. Pituitary, pineal, and cerebellar tonsils unremarkable. No upper cervical lesions. Flow voids are maintained throughout the carotid, basilar, and vertebral arteries. There are no areas of chronic hemorrhage. Visualized calvarium, skull base, and upper cervical osseous structures unremarkable.  Scalp and extracranial soft tissues, orbits, sinuses, and mastoids show no acute process. IMPRESSION: Negative exam.  No acute or focal intracranial findings. Electronically Signed   By: Staci Righter M.D.   On: 12/11/2015 13:34   Ct Head Code Stroke W/o Cm  12/10/2015  CLINICAL DATA:  45 year old male with code stroke. EXAM: CT HEAD WITHOUT CONTRAST TECHNIQUE: Contiguous axial images were obtained from the base of the skull through the vertex  without intravenous contrast. COMPARISON:  Head CT dated 08/26/2015 and MRI dated 08/26/2015 FINDINGS: The ventricles and the sulci are appropriate in size for the patient's age. There is no intracranial hemorrhage. No midline shift or mass effect identified. The gray-white matter differentiation is preserved. The visualized paranasal sinuses and mastoid air cells are well aerated. The calvarium is intact. IMPRESSION: No acute intracranial pathology. These results were called by telephone at the time of interpretation on 12/10/2015 at 9:08 pm to Dr. Nicole Kindred, who verbally acknowledged these results. Electronically Signed   By: Anner Crete M.D.   On: 12/10/2015 21:16   2D Echocardiogram  - Left ventricle: The cavity size was normal. There was mild concentric hypertrophy. Systolic function was normal. The estimated ejection fraction was in the range of 55% to 60%. Wall motion was normal; there were no regional wall motion abnormalities. Features are consistent with a pseudonormal left ventricular filling pattern, with concomitant abnormal relaxation and increased filling pressure (grade 2 diastolic dysfunction). Doppler parameters are consistent with elevated ventricular end-diastolic filling pressure. - Aortic valve: Trileaflet; normal thickness leaflets. There was no regurgitation. - Aortic root: The aortic root was normal in size. - Mitral valve: Structurally normal valve. There was mild regurgitation. - Left atrium: The atrium was mildly dilated. - Right ventricle: Systolic function was normal. - Right atrium: The atrium was normal in size. - Tricuspid valve: There was mild regurgitation. - Pulmonary arteries: Systolic pressure was within the normal range. - Inferior vena cava: The vessel was normal in size. - Pericardium, extracardiac: There was no pericardial effusion. Impressions:  No cardiac source of emboli was indentified.  Carotid Doppler   There is 1-39% bilateral ICA stenosis.  Vertebral artery flow is antegrade.     PHYSICAL EXAM Pleasant middle aged male not in distress. . Afebrile. Head is nontraumatic. Neck is supple without bruit.    Cardiac exam no murmur or gallop. Lungs are clear to auscultation. Distal pulses are well felt. Neurological Exam ;  Awake  Alert oriented x 3. Normal speech and language.eye movements full without nystagmus.fundi were not visualized. Vision acuity and fields appear normal. Hearing is normal. Palatal movements are normal. Face symmetric. Tongue midline. Normal strength, tone, reflexes and coordination. Diminished left hemibody touch.pinprick,vibration sensation with splitting of midline and forehead. Gait deferred. ASSESSMENT/PLAN Mr. Alvan Erstad is a 45 y.o. male with history of hypertension, hyperlipidemia, diabetes mellitus, heart failure and TIAs presenting with HA, left side numbness and left extremity weakness. He did not receive IV t-PA due to minimal findings.   Left body subjectve numbness with nonorganic features. No acute stroke  Exam / history not supportive of stroke or TIA  MRI  No acute stroke  Carotid Doppler  No significant stenosis   2D Echo  EF 55-60%. No source of embolus   LDL 58  HgbA1c 7.9  Heparin 5000 units sq tid for VTE prophylaxis  Diet Carb Modified Fluid consistency:: Thin; Room service appropriate?: Yes  aspirin 325 mg daily prior to admission, now on aspirin 325 mg daily  Patient counseled to be compliant with his antithrombotic medications  Ongoing aggressive stroke risk factor management  Therapy recommendations:  HH PT  Disposition:  Return home  Hypertension  Stable  Permissive hypertension (OK if < 220/120) but gradually normalize in 5-7 days  Long-term BP goal normotensive  Hyperlipidemia  Home meds:  lipitor 80, resumed in hospital  LDL 58, goal < 70  Continue statin at discharge  Diabetes  HgbA1c pending , goal < 7.0  Other Stroke Risk Factors  Hx  stroke/TIA  08/2014 TIA in setting of hypertensive urgency  Family hx stroke (father, brother)  Obstructive sleep apnea,   Chronic diastolic CHF  Other Active Problems  GERD  Depression w/ anxiety d/o  Hospital day #   [place] for Pager information 12/11/2015 3:54 PM  I have personally examined this patient, reviewed notes, independently viewed imaging studies, participated in medical decision making and plan of care. I have made any additions or clarifications directly to the above note. Agree with note above. He presented with acute onset left sided numbness and headaches following pain. Episodes sounds like strokelike symptoms though onset with pain is unusual. Recommend ongoing stroke evaluation. Greater than 50% time during this 25 minute visit was spent on counseling and coordination of care about stroke risk and prevention. Continue aspirin and stroke risk factor modification. Stroke team will sign off.   Antony Contras, MD Medical Director [facility] Pager: [phone] 12/11/2015 4:45 PM    To contact Stroke Continuity provider, please refer to [email or web address] After hours, contact General Neurology

## 2015-12-11 NOTE — Progress Notes (Signed)
Pt off unit when phlebotomy attempted venipuncture. Spoke with phlebotomy staff and asking for asap venipuncture for lipid profile. Wendee Copp

## 2015-12-11 NOTE — Evaluation (Signed)
Physical Therapy Evaluation Patient Details Name: Shane [name] MRN: [id number] DOB: 1970-07-05 Today's Date: 12/11/2015   History of Present Illness  Shane [name] is a 45 y.o. male, w c/o left sided pain, weakness, numbness, Starting last nite on the left leg and then radiated up towards chest and face.  Pt with recent L 2nd toe amputation 8 days ago by Dr. Marlou Sa. CT in ED negative.  Clinical Impression  Pt admitted with above. Pt with inconsistent exam, as pt demo'd L sided weakness and grip during MMT but was able to depend on UEs during ambulation due to L LE pain. Pt was also able to use hand to scoot self back and use L hand to pull up lever on chair to recline. Pt however had no response to noxious stimuli/deep nail bed pressure on L hand. Pt to benefit from post op shoe for L foot for protection and to allow for increased support during L LE WBing during ambulation.    Follow Up Recommendations Home health PT;Supervision for mobility/OOB    Equipment Recommendations  None recommended by PT    Recommendations for Other Services       Precautions / Restrictions Precautions Precautions: Fall Restrictions Weight Bearing Restrictions: Yes LLE Weight Bearing: Weight bearing as tolerated      Mobility  Bed Mobility Overal bed mobility: Independent             General bed mobility comments: used bed rail, increased time  Transfers Overall transfer level: Needs assistance Equipment used: Rolling walker (2 wheeled) Transfers: Sit to/from Stand Sit to Stand: Supervision         General transfer comment: pt maintain L LE TDWB, v/c's for safe hand placement, inceased time  Ambulation/Gait Ambulation/Gait assistance: Min guard Ambulation Distance (Feet): 15 Feet Assistive device: Rolling walker (2 wheeled) Gait Pattern/deviations: Step-to pattern   Gait velocity interpretation: Below normal speed for age/gender General Gait Details: instructed pt to WB through L  heal, pt only able to tolerate TDWB, pt essenstially hopping on R Foot.   Stairs            Wheelchair Mobility    Modified Rankin (Stroke Patients Only) Modified Rankin (Stroke Patients Only) Pre-Morbid Rankin Score: Moderate disability Modified Rankin: Moderate disability     Balance Overall balance assessment: Needs assistance Sitting-balance support: No upper extremity supported;Feet supported Sitting balance-Leahy Scale: Good     Standing balance support: Bilateral upper extremity supported Standing balance-Leahy Scale: Poor Standing balance comment: requires use of RW                             Pertinent Vitals/Pain Pain Assessment: 0-10 Pain Score: 8  Pain Location: L foot Pain Descriptors / Indicators: Aching Pain Intervention(s): Limited activity within patient's tolerance    Home Living Family/patient expects to be discharged to:: Private residence Living Arrangements: Spouse/significant other Available Help at Discharge: Family;Available 24 hours/day Type of Home: House Home Access: Stairs to enter Entrance Stairs-Rails: Right Entrance Stairs-Number of Steps: 3 Home Layout: One level Home Equipment: Walker - 2 wheels;Cane - single point      Prior Function Level of Independence: Needs assistance   Gait / Transfers Assistance Needed: uses RW due to limited WB on L LE  ADL's / Homemaking Assistance Needed: wife sets out clothes and set up bath but pt can dress and bath self in sitting        Hand Dominance  Dominant Hand: Right    Extremity/Trunk Assessment   Upper Extremity Assessment: LUE deficits/detail       LUE Deficits / Details: questionable effort however grip and elbow extension 3-/5 however able to use functionally when using RW and used L UE to pull up level to recline chair   Lower Extremity Assessment: LLE deficits/detail   LLE Deficits / Details: s/p [name]  Cervical / Trunk Assessment:  Normal  Communication   Communication: No difficulties  Cognition Arousal/Alertness: Lethargic (pt sleepy) Behavior During Therapy: Flat affect Overall Cognitive Status: History of cognitive impairments - at baseline       Memory: Decreased short-term memory (pt reports he had TIA in march and has memory deficits since)              General Comments      Exercises        Assessment/Plan    PT Assessment Patient needs continued PT services  PT Diagnosis Difficulty walking;Acute pain   PT Problem List Decreased strength;Decreased activity tolerance;Decreased balance;Decreased mobility;Decreased coordination;Decreased cognition;Decreased knowledge of use of DME  PT Treatment Interventions DME instruction;Gait training;Stair training;Functional mobility training;Therapeutic activities;Therapeutic exercise;Balance training;Patient/family education   PT Goals (Current goals can be found in the Care Plan section) Acute Rehab PT Goals Patient Stated Goal: didn't state    Frequency Min 3X/week   Barriers to discharge        Co-evaluation               End of Session Equipment Utilized During Treatment: Gait belt Activity Tolerance: Patient tolerated treatment well Patient left: in chair;with call bell/phone within reach;with chair alarm set Nurse Communication: Mobility status    Functional Assessment Tool Used: clinical judgement Functional Limitation: Mobility: Walking and moving around Mobility: Walking and Moving Around Current Status [id number]): At least 1 percent but less than 20 percent impaired, limited or restricted Mobility: Walking and Moving Around Goal Status [id number]): At least 1 percent but less than 20 percent impaired, limited or restricted    Time: 0912-0940 PT Time Calculation (min) (ACUTE ONLY): 28 min   Charges:   PT Evaluation $PT Eval Moderate Complexity: 1 Procedure PT Treatments $Gait Training: 8-22 mins   PT G Codes:   PT G-Codes **NOT  FOR INPATIENT CLASS** Functional Assessment Tool Used: clinical judgement Functional Limitation: Mobility: Walking and moving around Mobility: Walking and Moving Around Current Status [id number]): At least 1 percent but less than 20 percent impaired, limited or restricted Mobility: Walking and Moving Around Goal Status [phone]): At least 1 percent but less than 20 percent impaired, limited or restricted    Kingsley Callander 12/11/2015, 10:07 AM   Kittie Plater, PT, DPT Pager #: [phone] Office #: [phone]

## 2015-12-11 NOTE — Progress Notes (Signed)
Occupational Therapy Evaluation Patient Details Name: Shane [name] MRN: [id number] DOB: February 20, 1971 Today's Date: 12/11/2015    History of Present Illness Shane [name] is a 45 y.o. male, w c/o left sided pain, weakness, numbness, Starting last nite on the left leg and then radiated up towards chest and face.  Pt with recent L 2nd toe amputation 8 days ago by Dr. Marlou Sa. CT in ED negative.   Clinical Impression   PTA, pt mod I with ADL and mobility. Pt presents with L sided weakness per pt and complaints of numbness LUE. Pt with inconsistent performance. Pt with 2+/% L shoulder/elbow, 3/5 hand (difficulty with making full grasp)., however, able to use LUE functionally without apparent difficulty when presented with food/drink, use of cell phone. Pt also having difficulty scanning into L visual field, but then has no problem when name called or presented with stimulus from L side. Pt would benefit from use of tub bench. Will see pt to establish HEP for LUE and facilitate safe D/C home.    Follow Up Recommendations  Home health OT;Supervision/Assistance - 24 hour    Equipment Recommendations  Tub/shower bench    Recommendations for Other Services       Precautions / Restrictions Precautions Precautions: Fall Restrictions Weight Bearing Restrictions: Yes LLE Weight Bearing: Weight bearing as tolerated      Mobility Bed Mobility Overal bed mobility: Independent             General bed mobility comments: used bed rail, increased time  Transfers                 General transfer comment: unable to assess due to pt leaving for procedure.     Balance     Sitting balance-Leahy Scale: Good                                      ADL Overall ADL's : Needs assistance/impaired     Grooming: Set up;Sitting                               Functional mobility during ADLs:  (unable to assess) General ADL Comments: Pt appears overall set up for  ADL. Pt had opened all containers form meal and able to hodl can of soda with L hand without difficulty     Vision Vision Assessment?: Vision impaired- to be further tested in functional context Additional Comments: will further assess. Pt staes "it is hard to look to the L". Pt would only scan to midline, then slowly scn into L visual field   Perception     Praxis      Pertinent Vitals/Pain Pain Assessment: 0-10 Pain Score: 6  Pain Location: L foot Pain Descriptors / Indicators: Aching     Hand Dominance Right   Extremity/Trunk Assessment Upper Extremity Assessment Upper Extremity Assessment: LUE deficits/detail LUE Deficits / Details: weak grip. poor in hand manipulation. unable to complete full AROM L shoulder or hold against however, able to use funcitonally without difficulty LUE Sensation: decreased light touch (does not respond/withdraw from pain)   Lower Extremity Assessment Lower Extremity Assessment: Defer to PT evaluation LLE Deficits / Details: s/p [name]; reports LLE feels "funny" LLE Sensation: decreased light touch   Cervical / Trunk Assessment Cervical / Trunk Assessment: Other exceptions Cervical / Trunk Exceptions: reports sensory changes  of L sid eof back   Communication Communication Communication: No difficulties   Cognition Arousal/Alertness: Awake/alert (pt sleepy) Behavior During Therapy: Flat affect Overall Cognitive Status: History of cognitive impairments - at baseline       Memory: Decreased short-term memory (pt reports he had TIA in march and has memory deficits since)             General Comments       Exercises       Shoulder Instructions      Home Living Family/patient expects to be discharged to:: Private residence Living Arrangements: Spouse/significant other Available Help at Discharge: Family;Available 24 hours/day Type of Home: House Home Access: Stairs to enter [employer] of Steps:  3 Entrance Stairs-Rails: Right Home Layout: One level     Bathroom Shower/Tub: Tub/shower unit;Curtain   Biochemist, clinical: Standard     Home Equipment: Environmental consultant - 2 wheels;Cane - single point          Prior Functioning/Environment Level of Independence: Needs assistance  Gait / Transfers Assistance Needed: uses RW due to limited WB on L LE ADL's / Homemaking Assistance Needed: wife sets out clothes and set up bath but pt can dress and bath self in sitting        OT Diagnosis: Generalized weakness;Acute pain   OT Problem List: Decreased strength;Decreased range of motion;Decreased activity tolerance;Impaired balance (sitting and/or standing);Decreased coordination;Decreased safety awareness;Impaired sensation;Impaired UE functional use;Pain   OT Treatment/Interventions: Self-care/ADL training;Therapeutic exercise;Neuromuscular education;DME and/or AE instruction;Therapeutic activities;Visual/perceptual remediation/compensation;Patient/family education;Balance training    OT Goals(Current goals can be found in the care plan section) Acute Rehab OT Goals Patient Stated Goal: none stated OT Goal Formulation: With patient Time For Goal Achievement: 12/25/15 Potential to Achieve Goals: Good  OT Frequency: Min 2X/week   Barriers to D/C:            Co-evaluation              End of Session Nurse Communication: Mobility status;Other (comment) (inconsistencies withLUE)  Activity Tolerance: Patient tolerated treatment well Patient left: in bed;with call bell/phone within reach;Other (comment) (leaving with radiology)   Time: [id number] OT Time Calculation (min): 15 min Charges:  OT General Charges $OT Visit: 1 Procedure OT Evaluation $OT Eval Moderate Complexity: 1 Procedure G-Codes: OT G-codes **NOT FOR INPATIENT CLASS** Functional Assessment Tool Used: clinical judgement Functional Limitation: Self care Self Care Current Status [id number]): At least 1 percent but less  than 20 percent impaired, limited or restricted Self Care Goal Status [id number]): At least 1 percent but less than 20 percent impaired, limited or restricted  Kyannah Climer,HILLARY 12/11/2015, 3:59 PM   [facility], OTR/L  [phone] 12/11/2015

## 2015-12-11 NOTE — Progress Notes (Signed)
Physical therapy reports patient having nausea and vomiting of small amount thin emesis X 1. Paged MD to notify and ask for prn antiemetic. Wendee Copp

## 2015-12-11 NOTE — Care Management Note (Signed)
Case Management Note  Patient Details  Name: Shane [name] MRN: [id number] Date of Birth: [birth year]  Subjective/Objective:      Pt in to r/o CVA. He is from home with his spouse. Pt with medicaid potential and no active insurance.              Action/Plan: Awaiting PT/OT recommendations. Pt is already active with [facility]. CM following for d/c needs.   Expected Discharge Date:  12/12/15               Expected Discharge Plan:     In-House Referral:     Discharge planning Services     Post Acute Care Choice:    Choice offered to:     DME Arranged:    DME Agency:     HH Arranged:    HH Agency:     Status of Service:  In process, will continue to follow  If discussed at Long Length of Stay Meetings, dates discussed:    Additional Comments:  Pollie Friar, RN 12/11/2015, 1:48 PM

## 2015-12-11 NOTE — Progress Notes (Signed)
VASCULAR LAB PRELIMINARY  PRELIMINARY  PRELIMINARY  PRELIMINARY  Carotid duplex completed.    Preliminary report:  1-39% ICA plaquing.  Vertebral artery flow is antegrade.   Yalena Colon, RVT 12/11/2015, 3:16 PM

## 2015-12-11 NOTE — Progress Notes (Signed)
Left foot ok Toe amp healing Dorsal foot wound also healing but slower No obvious re infection Ok to dc - leave open tonight - dry dressing before dc - f/u with me friday

## 2015-12-11 NOTE — Progress Notes (Signed)
  Echocardiogram 2D Echocardiogram has been performed.  Shane [name] 12/11/2015, 3:00 PM

## 2015-12-12 DIAGNOSIS — Z89422 Acquired absence of other left toe(s): Secondary | ICD-10-CM

## 2015-12-12 DIAGNOSIS — F411 Generalized anxiety disorder: Secondary | ICD-10-CM

## 2015-12-12 DIAGNOSIS — N179 Acute kidney failure, unspecified: Secondary | ICD-10-CM

## 2015-12-12 LAB — CBC
HCT: 29.7 % — ABNORMAL LOW (ref 39.0–52.0)
Hemoglobin: 9.5 g/dL — ABNORMAL LOW (ref 13.0–17.0)
MCH: 29.2 pg (ref 26.0–34.0)
MCHC: 32 g/dL (ref 30.0–36.0)
MCV: 91.4 fL (ref 78.0–100.0)
PLATELETS: 396 10*3/uL (ref 150–400)
RBC: 3.25 MIL/uL — AB (ref 4.22–5.81)
RDW: 13 % (ref 11.5–15.5)
WBC: 8.3 10*3/uL (ref 4.0–10.5)

## 2015-12-12 LAB — COMPREHENSIVE METABOLIC PANEL
ALBUMIN: 3.2 g/dL — AB (ref 3.5–5.0)
ALT: 16 U/L — ABNORMAL LOW (ref 17–63)
AST: 16 U/L (ref 15–41)
Alkaline Phosphatase: 48 U/L (ref 38–126)
Anion gap: 9 (ref 5–15)
BUN: 20 mg/dL (ref 6–20)
CHLORIDE: 103 mmol/L (ref 101–111)
CO2: 25 mmol/L (ref 22–32)
Calcium: 9 mg/dL (ref 8.9–10.3)
Creatinine, Ser: 1.41 mg/dL — ABNORMAL HIGH (ref 0.61–1.24)
GFR calc Af Amer: >60 mL/min (ref >60)
GFR, EST NON AFRICAN AMERICAN: 58 mL/min — AB (ref >60)
Glucose, Bld: 146 mg/dL — ABNORMAL HIGH (ref 65–99)
POTASSIUM: 4.6 mmol/L (ref 3.5–5.1)
SODIUM: 137 mmol/L (ref 135–145)
Total Bilirubin: 0.3 mg/dL (ref 0.3–1.2)
Total Protein: 6.5 g/dL (ref 6.5–8.1)

## 2015-12-12 LAB — HEMOGLOBIN A1C
Hgb A1c MFr Bld: 8.3 % — ABNORMAL HIGH (ref 4.8–5.6)
Mean Plasma Glucose: 192 mg/dL

## 2015-12-12 MED ORDER — SULFAMETHOXAZOLE-TRIMETHOPRIM 800-160 MG PO TABS: 1.0000 | ORAL_TABLET | Freq: Two times a day (BID) | ORAL | Status: DC

## 2015-12-12 MED ORDER — AMOXICILLIN-POT CLAVULANATE 875-125 MG PO TABS: 1.0000 | ORAL_TABLET | Freq: Two times a day (BID) | ORAL | Status: DC

## 2015-12-12 MED FILL — ?AMOX-CLAV 875-125 MG TABLE: 875-125 | 5 days supply | Qty: 10 | Fill #0

## 2015-12-12 MED FILL — SULFAMETHOXAZOLE-TMP DS TAB: 800-160 | 5 days supply | Qty: 10 | Fill #0

## 2015-12-12 NOTE — Evaluation (Addendum)
Speech Language Pathology Evaluation Patient Details Name: Shane [name] MRN: [id number] DOB: 01/18/1971 Today's Date: 12/12/2015 Time: [id number] SLP Time Calculation (min) (ACUTE ONLY): 23 min  Problem List:  Patient Active Problem List   Diagnosis Date Noted  . Pain of toe of left foot   . Nausea & vomiting 12/10/2015  . Numbness 12/10/2015  . Cellulitis and abscess, left foot 12/02/2015  . Foot infection 12/02/2015  . Depression with anxiety 12/01/2015  . Chest pain 12/01/2015  . Diabetic foot infection ([place])   . Left foot pain 11/24/2015  . Insomnia 11/21/2015  . Anxiety state 09/04/2015  . GERD (gastroesophageal reflux disease) 08/18/2015  . Amputated toe of left foot ([place]) 08/18/2015  . Chronic anemia 08/01/2015  . Lower GI bleed 07/04/2015  . Erectile dysfunction 07/04/2015  . Anemia 04/24/2015  . AKI (acute kidney injury) ([place]) 04/24/2015  . Left arm weakness 04/02/2015  . Sensory disturbance 04/02/2015  . Essential hypertension 04/02/2015  . Hemispheric carotid artery syndrome   . HLD (hyperlipidemia)   . Internal carotid artery stenosis   . TIA (transient ischemic attack) 08/25/2014  . DM2 (diabetes mellitus, type 2) ([place]) 08/25/2014  . Headache    Past Medical History:  Past Medical History  Diagnosis Date  . Hypertension     a. 08/2014 Admitted with hypertensive urgency.  . Chest pain     a. 2015 Reportedly normal stress test in [place].  Marland Kitchen TIA (transient ischemic attack) 08/2014; 03/2015    a. 08/2014 in setting of hypertensive urgency.  . Anemia   . Refusal of blood transfusions as patient is Jehovah's Witness   . Hyperlipidemia   . Chronic diastolic CHF (congestive heart failure) (HCC)     a.03/2015 Echo: EF 55-60%, Gr 1 DD, mild MR, triv PR.  . Type II diabetes mellitus ([place])   . Sleep apnea   . GERD (gastroesophageal reflux disease)   . Anxiety   . Depression   . Bipolar disorder [facility])    Past Surgical History:  Past Surgical History  Procedure  Laterality Date  . Inguinal hernia repair Bilateral ~ 1983- ~ 1986  . Tonsillectomy  ~ 1985  . Circumcision    . Amputation Left 08/03/2015    Procedure: AMPUTATION LEFT GREAT TOE;  Surgeon: Newt Minion, MD;  Location: [facility];  Service: Orthopedics;  Laterality: Left;  . I&d extremity Left 12/02/2015    Procedure: IRRIGATION AND DEBRIDEMENT OF FOOT; LEFT SECOND TOE AMPUTATION;  Surgeon: Meredith Pel, MD;  Location: [facility];  Service: Orthopedics;  Laterality: Left;  . Amputation Left 12/02/2015    Procedure: amputation of left 2nd digit   HPI:  45 y.o. male, w c/o left sided pain, weakness, numbness, Starting last nite on the left leg and then radiated up towards chest and face. Pt with recent L 2nd toe amputation 8 days ago by Dr. Marlou Sa. CT in ED negative.   Assessment / Plan / Recommendation Clinical Impression  Pt appears to be at his cognitive-lingusitic baseline, which does include mild-moderate difficulties with selective attention and calculations. Min cues provided for retrieval of new information. He reports having had difficulty with his cognition since his prior TIA about one year ago, and does not believe that this has worsened with this current admission. Of note, pt believes he has significant trouble with word-finding and linguistic tasks, but he scored WFL on related subtests on the Cognistate. No overt speech/language difficulty observed during spontaneous communication. He appears to have adequate support  at home PTA to manage his cognitive function. No acute SLP needs identified, will sign off. Recommend 24/7 supervision upon initial d/c home.    SLP Assessment  Patient does not need any further Speech Lanaguage Pathology Services    Follow Up Recommendations  24 hour supervision/assistance    Frequency and Duration           SLP Evaluation Prior Functioning  Cognitive/Linguistic Baseline: Baseline deficits Baseline deficit details: pt describes difficulty with  attention, calculations, word-finding since TIA one year prior Type of Home: House  Lives With: Spouse Vocation: Unemployed   Cognition  Overall Cognitive Status: History of cognitive impairments - at baseline    Comprehension  Auditory Comprehension Overall Auditory Comprehension: Appears within functional limits for tasks assessed    Expression Expression Primary Mode of Expression: Verbal Verbal Expression Overall Verbal Expression: Appears within functional limits for tasks assessed   Oral / Motor  Oral Motor/Sensory Function Overall Oral Motor/Sensory Function: Within functional limits Motor Speech Overall Motor Speech: Appears within functional limits for tasks assessed   GO          Functional Assessment Tool Used: skilled clinical judgment Functional Limitations: Attention Attention Current Status [id number]): At least 20 percent but less than 40 percent impaired, limited or restricted Attention Goal Status [id number]): At least 20 percent but less than 40 percent impaired, limited or restricted Attention Discharge Status [phone]): At least 20 percent but less than 40 percent impaired, limited or restricted         Shane [name], M.A. CCC-SLP [phone]  Shane [name] 12/12/2015, 11:57 AM

## 2015-12-12 NOTE — Care Management Note (Signed)
Case Management Note  Patient Details  Name: Shane [name] MRN: [id number] Date of Birth: 1971-03-29  Subjective/Objective:                    Action/Plan: Patient discharging home with orders for [facility] services. Pt does not have insurance and with his diagnosis he does not qualify for charity services. MD and patient informed. Pt is already active with the [facility] and knows to use the pharmacy to assist with his medications. Bedside RN updated.   Expected Discharge Date:  12/12/15               Expected Discharge Plan:  [place]  In-House Referral:     Discharge planning Services  CM Consult  Post Acute Care Choice:    Choice offered to:     DME Arranged:    DME Agency:     HH Arranged:    [place] Agency:     Status of Service:  Completed, signed off  If discussed at [employer], dates discussed:    Additional Comments:  Pollie Friar, RN 12/12/2015, 2:51 PM

## 2015-12-12 NOTE — Discharge Summary (Addendum)
Physician Discharge Summary  Shane [name] MRN: [id number] DOB/AGE: [birth year] 45 y.o.  PCP: Minerva Ends, MD   Admit date: 12/10/2015 Discharge date: 12/12/2015  Discharge Diagnoses:     Active Problems:   TIA (transient ischemic attack)   DM2 (diabetes mellitus, type 2) (HCC)   Headache   Anemia   Nausea & vomiting   Numbness   Pain of toe of left foot    Follow-up recommendations Follow-up with PCP in 3-5 days , including all  additional recommended appointments as below Follow-up CBC, CMP in 3-5 days Patient to follow-up with Meredith Pel, MD on 7/7 Patient is being discharged with home health-to continue with dry dressings to the left foot      Current Discharge Medication List    CONTINUE these medications which have CHANGED   Details  amoxicillin-clavulanate (AUGMENTIN) 875-125 MG tablet Take 1 tablet by mouth 2 (two) times daily. Qty: 10 tablet, Refills: 0    sulfamethoxazole-trimethoprim (BACTRIM DS,SEPTRA DS) 800-160 MG tablet Take 1 tablet by mouth 2 (two) times daily. Qty: 10 tablet, Refills: 0      CONTINUE these medications which have NOT CHANGED   Details  amLODipine (NORVASC) 5 MG tablet Take 1 tablet (5 mg total) by mouth daily. Qty: 90 tablet, Refills: 3   Associated Diagnoses: Essential hypertension    aspirin EC 325 MG tablet Take 1 tablet (325 mg total) by mouth daily. Qty: 30 tablet, Refills: 0   Associated Diagnoses: Transient cerebral ischemia, unspecified transient cerebral ischemia type    atorvastatin (LIPITOR) 80 MG tablet Take 1 tablet (80 mg total) by mouth every morning. 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 Qty: 30 tablet, Refills: 5    Associated Diagnoses: DMDD (disruptive mood dysregulation disorder) (HCC)    ferrous sulfate 325 (65 FE) MG tablet Take 1 tablet (325 mg total) by mouth 2 (two) times daily with a meal. Qty: 60 tablet, Refills: 5   Associated Diagnoses: Lower GI bleed; Anemia, unspecified anemia type    FLUoxetine (PROZAC) 10 MG tablet Take 10 mg by mouth daily.    gabapentin (NEURONTIN) 100 MG capsule Take 1 capsule (100 mg total) by mouth 3 (three) times daily. Qty: 90 capsule, Refills: 3   Associated Diagnoses: Diabetic polyneuropathy associated with diabetes mellitus due to underlying condition (HCC)    glipiZIDE (GLUCOTROL) 10 MG tablet Take 1 tablet (10 mg total) by mouth 2 (two) times daily before a meal. 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 Qty: 30 tablet, Refills: 5   Associated Diagnoses: Essential hypertension    metFORMIN (GLUCOPHAGE) 1000 MG tablet Take 1 tablet (1,000 mg total) by mouth 2 (two) times daily with a meal. Qty: 60 tablet, Refills: 5   Associated Diagnoses: Type 2 diabetes mellitus with other neurologic complication (HCC)    omeprazole (PRILOSEC) 20 MG capsule Take 1 capsule (20 mg total) by mouth daily. Qty: 30 capsule, Refills: 5   Associated Diagnoses: Gastroesophageal reflux disease without esophagitis    oxyCODONE-acetaminophen (PERCOCET/ROXICET) 5-325 MG  tablet Take 2 tablets by mouth every 6 (six) hours as needed for severe pain. Qty: 20 tablet, Refills: 0    traZODone (DESYREL) 50 MG tablet Take 0.5-1 tablets (25-50 mg total) by mouth at bedtime as needed for sleep. Qty: 30 tablet, Refills: 3   Associated Diagnoses: Insomnia      STOP taking these medications     ibuprofen (ADVIL,MOTRIN) 800 MG tablet          Discharge Condition: Stable *  Discharge Instructions Get Medicines  reviewed and adjusted: Please take all your medications with you for your next visit with your Primary MD  Please request your Primary MD to go over all hospital tests and procedure/radiological results at the follow up, please ask your Primary MD to get all Hospital records sent to his/her office.  If you experience worsening of your admission symptoms, develop shortness of breath, life threatening emergency, suicidal or homicidal thoughts you must seek medical attention immediately by calling 911 or calling your MD immediately if symptoms less severe.  You must read complete instructions/literature along with all the possible adverse reactions/side effects for all the Medicines you take and that have been prescribed to you. Take any new Medicines after you have completely understood and accpet all the possible adverse reactions/side effects.   Do not drive when taking Pain medications.   Do not take more than prescribed Pain, Sleep and Anxiety Medications  Special Instructions: If you have smoked or chewed Tobacco in the last 2 yrs please stop smoking, stop any regular Alcohol and or any Recreational drug use.  Wear Seat belts while driving.  Please note  You were cared for by a hospitalist during your hospital stay. Once you are discharged, your primary care physician will handle any further medical issues. Please note that NO REFILLS for any discharge medications will be authorized once you are discharged, as it is imperative that you return to your primary care physician (or establish a relationship with a primary care physician if you do not have one) for your aftercare needs so that they can reassess your need for medications and monitor your lab values.  Discharge Instructions    Diet - low sodium heart healthy    Complete by:  As directed      Increase activity slowly    Complete by:  As directed             Allergies  Allergen Reactions  . Eggs Or Egg-Derived Products  Diarrhea  . Other Other (See Comments)    Red meat causes stomach pains, bloating and diarrhea  . Milk-Related Compounds Diarrhea and Other (See Comments)    Any dairy products  - diarrhea and bloating      Disposition: Home with home health   Consults:  Neurology Orthopedics    Significant Diagnostic Studies:  Dg Chest 2 View  12/11/2015  CLINICAL DATA:  TIA EXAM: CHEST  2 VIEW COMPARISON:  12/01/2015 FINDINGS: Heart and mediastinal contours are within normal limits. No focal opacities or effusions. No acute bony abnormality. IMPRESSION: No active cardiopulmonary disease. Electronically Signed   By: Rolm Baptise M.D.   On: 12/11/2015 07:40   Dg Chest 2 View  12/01/2015  CLINICAL DATA:  Chest pain for couple of hours. EXAM: CHEST  2 VIEW COMPARISON:  08/26/2015 FINDINGS: The heart size and mediastinal contours are within normal limits. Both lungs are clear. The visualized skeletal structures are unremarkable. IMPRESSION: No active cardiopulmonary disease. Electronically Signed  By: Lucienne Capers M.D.   On: 12/01/2015 01:37   Mr Brain Wo Contrast  12/11/2015  CLINICAL DATA:  Recurrent acute LEFT-sided numbness and weakness. Headache. EXAM: MRI HEAD WITHOUT CONTRAST TECHNIQUE: Multiplanar, multiecho pulse sequences of the brain and surrounding structures were obtained without intravenous contrast. COMPARISON:  None. FINDINGS: No evidence for acute infarction, hemorrhage, mass lesion, hydrocephalus, or extra-axial fluid. Normal cerebral volume. No significant white matter disease. Pituitary, pineal, and cerebellar tonsils unremarkable. No upper cervical lesions. Flow voids are maintained throughout the carotid, basilar, and vertebral arteries. There are no areas of chronic hemorrhage. Visualized calvarium, skull base, and upper cervical osseous structures unremarkable. Scalp and extracranial soft tissues, orbits, sinuses, and mastoids show no acute process. IMPRESSION: Negative exam.  No  acute or focal intracranial findings. Electronically Signed   By: Staci Righter M.D.   On: 12/11/2015 13:34   Mr Foot Left Wo Contrast  12/02/2015  CLINICAL DATA:  Left foot pain and left second toe swelling for 2 weeks. Fever and chills. Question abscess or osteomyelitis. EXAM: MRI OF THE LEFT FOREFOOT WITHOUT CONTRAST TECHNIQUE: Multiplanar, multisequence MR imaging was performed. No intravenous contrast was administered. COMPARISON:  Plain films left foot 11/30/2015. MRI left foot 08/02/2015. FINDINGS: Amputation of the great toe is identified as seen on comparison plain films. Marked soft tissue edema is seen over the dorsum of the foot. A large area of heterogeneous signal abnormality is seen in the subcutaneous soft tissues over the dorsum of the foot. This area measures up to 4.4 cm transverse at the level of the distal metatarsals by 1.8 cm craniocaudal by 5.6 cm long. Smaller areas of similar signal abnormality are seen extending to the level of the mid foot. The coronal STIR sequence demonstrates mild appearing edema distal phalanx of the second toe but this cannot be confirmed on other sequences. Bone marrow signal is otherwise unremarkable. IMPRESSION: Intense edema over the dorsum of the foot is compatible with cellulitis. Heterogeneous areas of signal abnormality in the dorsum of the foot is worrisome for abscess. Mildly increased T2 signal in the coronal STIR sequence in the distal phalanx of the second toe cannot be confirmed on other sequences. No convincing evidence of osteomyelitis is identified. Electronically Signed   By: Inge Rise M.D.   On: 12/02/2015 08:43   Dg Foot 2 Views Left  11/22/2015  CLINICAL DATA:  Left foot pain/ swelling at site of great toe amputation EXAM: LEFT FOOT - 2 VIEW COMPARISON:  08/13/2015 FINDINGS: Status post left 1st digit amputation at the MTP joint. No fracture or dislocation is seen. The joint spaces are preserved. Mild soft tissue swelling overlying  the 1st digit. No radiopaque foreign body is seen. IMPRESSION: Mild soft tissue swelling at the site of 1st digit amputation. No radiopaque foreign body is seen. No fracture or dislocation is seen. Electronically Signed   By: Julian Hy M.D.   On: 11/22/2015 09:36   Dg Foot Complete Left  12/01/2015  CLINICAL DATA:  Left foot wound. Diabetes. Second toe nail fell off 5 days ago. EXAM: LEFT FOOT - COMPLETE 3+ VIEW COMPARISON:  11/21/2015 FINDINGS: Previous amputation of the left first toe at the metatarsal-phalangeal joint level. Soft tissues at the site of resection or prominent but unchanged since previous study. No radiopaque soft tissue foreign bodies or gas collections. Vascular calcifications. No bone erosion or sclerosis to suggest osteomyelitis. No acute fracture or dislocation. IMPRESSION: Previous amputation of the left first toe. No evidence of  osteomyelitis. Electronically Signed   By: Lucienne Capers M.D.   On: 12/01/2015 00:27   Ct Head Code Stroke W/o Cm  12/10/2015  CLINICAL DATA:  45 year old male with code stroke. EXAM: CT HEAD WITHOUT CONTRAST TECHNIQUE: Contiguous axial images were obtained from the base of the skull through the vertex without intravenous contrast. COMPARISON:  Head CT dated 08/26/2015 and MRI dated 08/26/2015 FINDINGS: The ventricles and the sulci are appropriate in size for the patient's age. There is no intracranial hemorrhage. No midline shift or mass effect identified. The gray-white matter differentiation is preserved. The visualized paranasal sinuses and mastoid air cells are well aerated. The calvarium is intact. IMPRESSION: No acute intracranial pathology. These results were called by telephone at the time of interpretation on 12/10/2015 at 9:08 pm to Dr. Nicole Kindred, who verbally acknowledged these results. Electronically Signed   By: Anner Crete M.D.   On: 12/10/2015 21:16     2-D echo LV EF: 55% -  60%  ------------------------------------------------------------------- Indications: TIA 435.9.  ------------------------------------------------------------------- History: PMH: Sleep apnea. Bipolar disorder. Congestive heart failure. Risk factors: Hypertension. Diabetes mellitus. Dyslipidemia.  ------------------------------------------------------------------- Study Conclusions  - Left ventricle: The cavity size was normal. There was mild  concentric hypertrophy. Systolic function was normal. The  estimated ejection fraction was in the range of 55% to 60%. Wall  motion was normal; there were no regional wall motion  abnormalities. Features are consistent with a pseudonormal left  ventricular filling pattern, with concomitant abnormal relaxation  and increased filling pressure (grade 2 diastolic dysfunction).  Doppler parameters are consistent with elevated ventricular  end-diastolic filling pressure. - Aortic valve: Trileaflet; normal thickness leaflets. There was no  regurgitation. - Aortic root: The aortic root was normal in size. - Mitral valve: Structurally normal valve. There was mild  regurgitation. - Left atrium: The atrium was mildly dilated. - Right ventricle: Systolic function was normal. - Right atrium: The atrium was normal in size. - Tricuspid valve: There was mild regurgitation. - Pulmonary arteries: Systolic pressure was within the normal  range. - Inferior vena cava: The vessel was normal in size. - Pericardium, extracardiac: There was no pericardial effusion.  Impressions:  - No cardiac source of emboli was indentified.   Filed Weights   12/10/15 2033 12/11/15 0000  Weight: 76.318 kg (168 lb 4 oz) 79.4 kg (175 lb 0.7 oz)     Microbiology: Recent Results (from the past 240 hour(s))  Aerobic/Anaerobic Culture (surgical/deep wound)     Status: None   Collection Time: 12/02/15  5:16 PM  Result Value Ref Range Status    Specimen Description WOUND  Final   Special Requests LEFT SECOND TOE  Final   Gram Stain   Final    RARE WBC PRESENT,BOTH PMN AND MONONUCLEAR NO ORGANISMS SEEN    Culture No growth aerobically or anaerobically.  Final   Report Status 12/07/2015 FINAL  Final       Blood Culture    Component Value Date/Time   SDES WOUND 12/02/2015 1716   SPECREQUEST LEFT SECOND TOE 12/02/2015 1716   CULT No growth aerobically or anaerobically. 12/02/2015 1716   REPTSTATUS 12/07/2015 FINAL 12/02/2015 1716      Labs: Results for orders placed or performed during the hospital encounter of 12/10/15 (from the past 48 hour(s))  Protime-INR     Status: None   Collection Time: 12/10/15  8:39 PM  Result Value Ref Range   Prothrombin Time 14.7 11.6 - 15.2 seconds   INR 1.13 0.00 -  1.49  APTT     Status: None   Collection Time: 12/10/15  8:39 PM  Result Value Ref Range   aPTT 29 24 - 37 seconds  CBC     Status: Abnormal   Collection Time: 12/10/15  8:39 PM  Result Value Ref Range   WBC 8.1 4.0 - 10.5 K/uL   RBC 3.49 (L) 4.22 - 5.81 MIL/uL   Hemoglobin 10.1 (L) 13.0 - 17.0 g/dL   HCT 31.9 (L) 39.0 - 52.0 %   MCV 91.4 78.0 - 100.0 fL   MCH 28.9 26.0 - 34.0 pg   MCHC 31.7 30.0 - 36.0 g/dL   RDW 13.1 11.5 - 15.5 %   Platelets 429 (H) 150 - 400 K/uL  Differential     Status: None   Collection Time: 12/10/15  8:39 PM  Result Value Ref Range   Neutrophils Relative % 75 %   Neutro Abs 6.1 1.7 - 7.7 K/uL   Lymphocytes Relative 19 %   Lymphs Abs 1.6 0.7 - 4.0 K/uL   Monocytes Relative 4 %   Monocytes Absolute 0.3 0.1 - 1.0 K/uL   Eosinophils Relative 2 %   Eosinophils Absolute 0.1 0.0 - 0.7 K/uL   Basophils Relative 0 %   Basophils Absolute 0.0 0.0 - 0.1 K/uL  Comprehensive metabolic panel     Status: Abnormal   Collection Time: 12/10/15  8:39 PM  Result Value Ref Range   Sodium 137 135 - 145 mmol/L   Potassium 4.3 3.5 - 5.1 mmol/L   Chloride 106 101 - 111 mmol/L   CO2 23 22 - 32 mmol/L    Glucose, Bld 100 (H) 65 - 99 mg/dL   BUN 20 6 - 20 mg/dL   Creatinine, Ser 1.32 (H) 0.61 - 1.24 mg/dL   Calcium 9.0 8.9 - 10.3 mg/dL   Total Protein 6.9 6.5 - 8.1 g/dL   Albumin 3.3 (L) 3.5 - 5.0 g/dL   AST 17 15 - 41 U/L   ALT 16 (L) 17 - 63 U/L   Alkaline Phosphatase 49 38 - 126 U/L   Total Bilirubin 0.4 0.3 - 1.2 mg/dL   GFR calc non Af Amer >60 >60 mL/min   GFR calc Af Amer >60 >60 mL/min    Comment: (NOTE) The eGFR has been calculated using the CKD EPI equation. This calculation has not been validated in all clinical situations. eGFR's persistently <60 mL/min signify possible Chronic Kidney Disease.    Anion gap 8 5 - 15  I-Stat Chem 8, ED     Status: Abnormal   Collection Time: 12/10/15  8:53 PM  Result Value Ref Range   Sodium 142 135 - 145 mmol/L   Potassium 4.5 3.5 - 5.1 mmol/L   Chloride 105 101 - 111 mmol/L   BUN 22 (H) 6 - 20 mg/dL   Creatinine, Ser 1.30 (H) 0.61 - 1.24 mg/dL   Glucose, Bld 98 65 - 99 mg/dL   Calcium, Ion 1.18 1.12 - 1.23 mmol/L   TCO2 25 0 - 100 mmol/L   Hemoglobin 11.6 (L) 13.0 - 17.0 g/dL   HCT 34.0 (L) 39.0 - 52.0 %  I-stat troponin, ED     Status: None   Collection Time: 12/10/15  8:58 PM  Result Value Ref Range   Troponin i, poc 0.01 0.00 - 0.08 ng/mL   Comment 3            Comment: Due to the release kinetics of cTnI, a negative  result within the first hours of the onset of symptoms does not rule out myocardial infarction with certainty. If myocardial infarction is still suspected, repeat the test at appropriate intervals.   CBG monitoring, ED     Status: None   Collection Time: 12/10/15  9:01 PM  Result Value Ref Range   Glucose-Capillary 97 65 - 99 mg/dL  Glucose, capillary     Status: Abnormal   Collection Time: 12/10/15 11:49 PM  Result Value Ref Range   Glucose-Capillary 117 (H) 65 - 99 mg/dL   Comment 1 Notify RN    Comment 2 Document in Chart   Glucose, capillary     Status: Abnormal   Collection Time: 12/11/15  6:20 AM   Result Value Ref Range   Glucose-Capillary 140 (H) 65 - 99 mg/dL  Hemoglobin A1c     Status: Abnormal   Collection Time: 12/11/15  8:30 AM  Result Value Ref Range   Hgb A1c MFr Bld 8.3 (H) 4.8 - 5.6 %    Comment: (NOTE)         Pre-diabetes: 5.7 - 6.4         Diabetes: >6.4         Glycemic control for adults with diabetes: <7.0    Mean Plasma Glucose 192 mg/dL    Comment: (NOTE) Performed At: [facility] [place], [place] [id number] Lindon Romp MD TO:[id number]   Lipid panel     Status: Abnormal   Collection Time: 12/11/15  8:30 AM  Result Value Ref Range   Cholesterol 107 0 - 200 mg/dL   Triglycerides 129 <150 mg/dL   HDL 23 (L) >40 mg/dL   Total CHOL/HDL Ratio 4.7 RATIO   VLDL 26 0 - 40 mg/dL   LDL Cholesterol 58 0 - 99 mg/dL    Comment:        Total Cholesterol/HDL:CHD Risk Coronary Heart Disease Risk Table                     Men   Women  1/2 Average Risk   3.4   3.3  Average Risk       5.0   4.4  2 X Average Risk   9.6   7.1  3 X Average Risk  23.4   11.0        Use the calculated Patient Ratio above and the CHD Risk Table to determine the patient's CHD Risk.        ATP III CLASSIFICATION (LDL):  <100     mg/dL   Optimal  100-129  mg/dL   Near or Above                    Optimal  130-159  mg/dL   Borderline  160-189  mg/dL   High  >190     mg/dL   Very High   Glucose, capillary     Status: Abnormal   Collection Time: 12/11/15 11:40 AM  Result Value Ref Range   Glucose-Capillary 183 (H) 65 - 99 mg/dL   Comment 1 Notify RN    Comment 2 Document in Chart   Glucose, capillary     Status: Abnormal   Collection Time: 12/11/15  4:50 PM  Result Value Ref Range   Glucose-Capillary 124 (H) 65 - 99 mg/dL   Comment 1 Notify RN    Comment 2 Document in Chart   Glucose, capillary     Status: None  Collection Time: 12/11/15 10:04 PM  Result Value Ref Range   Glucose-Capillary 90 65 - 99 mg/dL   Comment 1 Notify RN    Comment 2  Document in Chart   CBC     Status: Abnormal   Collection Time: 12/12/15  5:17 AM  Result Value Ref Range   WBC 8.3 4.0 - 10.5 K/uL   RBC 3.25 (L) 4.22 - 5.81 MIL/uL   Hemoglobin 9.5 (L) 13.0 - 17.0 g/dL   HCT 29.7 (L) 39.0 - 52.0 %   MCV 91.4 78.0 - 100.0 fL   MCH 29.2 26.0 - 34.0 pg   MCHC 32.0 30.0 - 36.0 g/dL   RDW 13.0 11.5 - 15.5 %   Platelets 396 150 - 400 K/uL  Comprehensive metabolic panel     Status: Abnormal   Collection Time: 12/12/15  5:17 AM  Result Value Ref Range   Sodium 137 135 - 145 mmol/L   Potassium 4.6 3.5 - 5.1 mmol/L   Chloride 103 101 - 111 mmol/L   CO2 25 22 - 32 mmol/L   Glucose, Bld 146 (H) 65 - 99 mg/dL   BUN 20 6 - 20 mg/dL   Creatinine, Ser 1.41 (H) 0.61 - 1.24 mg/dL   Calcium 9.0 8.9 - 10.3 mg/dL   Total Protein 6.5 6.5 - 8.1 g/dL   Albumin 3.2 (L) 3.5 - 5.0 g/dL   AST 16 15 - 41 U/L   ALT 16 (L) 17 - 63 U/L   Alkaline Phosphatase 48 38 - 126 U/L   Total Bilirubin 0.3 0.3 - 1.2 mg/dL   GFR calc non Af Amer 58 (L) >60 mL/min   GFR calc Af Amer >60 >60 mL/min    Comment: (NOTE) The eGFR has been calculated using the CKD EPI equation. This calculation has not been validated in all clinical situations. eGFR's persistently <60 mL/min signify possible Chronic Kidney Disease.    Anion gap 9 5 - 15     Lipid Panel     Component Value Date/Time   CHOL 107 12/11/2015 0830   TRIG 129 12/11/2015 0830   HDL 23* 12/11/2015 0830   CHOLHDL 4.7 12/11/2015 0830   VLDL 26 12/11/2015 0830   LDLCALC 58 12/11/2015 0830     Lab Results  Component Value Date   HGBA1C 8.3* 12/11/2015   HGBA1C 7.9 11/21/2015   HGBA1C 7.1 08/18/2015       44 y.o. Male with history of hypertension, hyperlipidemia, diabetes mellitus, heart failure and TIAs presenting with recurrent acute left side numbness and left extremity weakness, w c/o left sided pain, weakness, numbness, Starting last nite on the left leg and then radiated up towards chest and face. Pt  presented to ED for evaluation.  In ED, CT brain negative. Neurology evaluated patient and requested medical admission for TIA/ Migraine. Pt noted ot have Hgb 11.6, and mild renal insufficiency w creatinine 1.3.He was not considered a candidate for lytic therapy with IV TPA because of minimal clinical findings. He was admitted for further evaluation and treatment.   Hospital course TIA/possible recurrent right subcortical cerebral ischemic event 1. HgbA1c 8.3, fasting lipid panel shows LDL of 58, triglycerides 129 2. MRI, MRA of the brain without contrast negative for acute CVA 3. PT consult, OT consult,-home health, Speech therapy recommended regular diet 4. Carotid dopplers  -39% ICA plaquing. Vertebral artery flow is antegrade 5. Prophylactic therapy-Antiplatelet med: Continue aspirin 325 mg a day 6. Risk factor modification 7. Telemetry monitoring-NSR  .  He presented with acute onset left sided numbness and headaches following pain. Episodes sounds like strokelike symptoms though onset with pain is unusual   Left foot cellulitis/abscess with pain/Diabetic foot infection Dr. Marlou Sa of orthopedic surgery on 12/02/15 did irrigation and debridement of the foot with the left second toe amputation.  Was Treated with Rocephin, Flagyl and vancomycin while in the hospital, discharged home on an additional 5 days of treatment with Augmentin/Septra on 6/19, I have refilled his antibiotics for another 5 days Dr. Marlou Sa has seen patient during this hospitalization and he recommends outpatient follow-up on Friday, continue with dry dressings  DM-II Last A1c 7.9 on 11/21/15, now 8.3 not well controled. Patient is taking metformin and glipizide at home. Managed with insulin sensitive SSI while in the hospital. Resume usual treatment at discharge with close follow-up with PCP to assess glycemic control which may have been suboptimal secondary to infection.  HLD  Continue home medications:  Lipitor.  HTN: Continue Coreg, amlodipine, lisinopril.  GERD Continue Protonix.  Depression with anxiety and bipolar Stable, no suicidal or homicidal ideations. Continue home medications: Prozac and Depakote.  Chronic diastolic congestive heart failure 2-D echo 04/03/15 showed EF of 55% with grade 1 diastolic dysfunction. Patient is not taking diuretics at home. Continue aspirin and Coreg. Chest x-ray clear.     Discharge Exam:   Blood pressure 141/78, pulse 91, temperature 98.6 F (37 C), temperature source Oral, resp. rate 20, height 5' 8"  (1.727 m), weight 79.4 kg (175 lb 0.7 oz), SpO2 98 %.   General exam: Appears calm and comfortable  Respiratory system: Clear to auscultation. Respiratory effort normal. Cardiovascular system: S1 & S2 heard, RRR. No JVD, murmurs, rubs, gallops or clicks. No pedal edema. Gastrointestinal system: Abdomen is nondistended, soft and nontender. No organomegaly or masses felt. Normal bowel sounds heard. Central nervous system: Alert and oriented. No focal neurological deficits. Extremities: Symmetric 5 x 5 power.Left foot ok,Toe amp healing Skin: No rashes, lesions or ulcers Psychiatry: Judgement and insight appear normal. Mood & affect appropriate.    Follow-up Information    Follow up with Minerva Ends, MD. Schedule an appointment as soon as possible for a visit in 3 days.   Specialty:  Family Medicine   Why:  Hospital follow-up   Contact information:   [place] [address] [phone]       Follow up with Meredith Pel, MD. Schedule an appointment as soon as possible for a visit on 12/22/2015.   Specialty:  Orthopedic Surgery   Why:  Please call to confirm appointment   Contact information:   [address] [place] [address] [phone]       Signed: Reyne Dumas 12/12/2015, 2:22 PM        Time spent >45 mins

## 2015-12-14 ENCOUNTER — Encounter (HOSPITAL_COMMUNITY): Payer: Self-pay | Admitting: Emergency Medicine

## 2015-12-14 ENCOUNTER — Emergency Department (HOSPITAL_COMMUNITY): Payer: Medicaid Other

## 2015-12-14 ENCOUNTER — Emergency Department (HOSPITAL_COMMUNITY)
Admission: EM | Admit: 2015-12-14 | Discharge: 2015-12-14 | Disposition: A | Discharge status: Home / Self Care | Payer: Medicaid Other | Attending: Emergency Medicine | Admitting: Emergency Medicine

## 2015-12-14 DIAGNOSIS — Z7982 Long term (current) use of aspirin: Secondary | ICD-10-CM | POA: Insufficient documentation

## 2015-12-14 DIAGNOSIS — R200 Anesthesia of skin: Secondary | ICD-10-CM | POA: Diagnosis present

## 2015-12-14 DIAGNOSIS — I5032 Chronic diastolic (congestive) heart failure: Secondary | ICD-10-CM | POA: Insufficient documentation

## 2015-12-14 DIAGNOSIS — Z7984 Long term (current) use of oral hypoglycemic drugs: Secondary | ICD-10-CM | POA: Insufficient documentation

## 2015-12-14 DIAGNOSIS — I110 Hypertensive heart disease with heart failure: Secondary | ICD-10-CM | POA: Diagnosis not present

## 2015-12-14 DIAGNOSIS — Z79899 Other long term (current) drug therapy: Secondary | ICD-10-CM | POA: Diagnosis not present

## 2015-12-14 DIAGNOSIS — R208 Other disturbances of skin sensation: Secondary | ICD-10-CM

## 2015-12-14 DIAGNOSIS — Z8673 Personal history of transient ischemic attack (TIA), and cerebral infarction without residual deficits: Secondary | ICD-10-CM | POA: Diagnosis not present

## 2015-12-14 DIAGNOSIS — E1140 Type 2 diabetes mellitus with diabetic neuropathy, unspecified: Secondary | ICD-10-CM | POA: Diagnosis not present

## 2015-12-14 NOTE — ED Provider Notes (Signed)
CSN: [id number]     Arrival date & time 12/14/15  1148 History   First MD Initiated Contact with Patient 12/14/15 1345     Chief Complaint  Patient presents with  . Numbness     (Consider location/radiation/quality/duration/timing/severity/associated sxs/prior Treatment) HPI   Shane [name] is a 45 y.o. male, with a history of DM, peripheral neuropathy, and CHF, presenting to the ED with left foot numbness that began this morning. Went to bed normal last night. Patient woke up this morning with numbness to the left foot. Patient had a left second toe amputation and dorsal foot I&D of an abscess on June 17. Patient just saw Dr. Marlou Sa for follow-up yesterday and was told things were going well. Patient denies fever/chills, pain, weakness, any other numbness, or any other complaints.  Past Medical History  Diagnosis Date  . Hypertension     a. 08/2014 Admitted with hypertensive urgency.  . Chest pain     a. 2015 Reportedly normal stress test in [place].  Marland Kitchen TIA (transient ischemic attack) 08/2014; 03/2015    a. 08/2014 in setting of hypertensive urgency.  . Anemia   . Refusal of blood transfusions as patient is Jehovah's Witness   . Hyperlipidemia   . Chronic diastolic CHF (congestive heart failure) (HCC)     a.03/2015 Echo: EF 55-60%, Gr 1 DD, mild MR, triv PR.  . Type II diabetes mellitus ([place])   . Sleep apnea   . GERD (gastroesophageal reflux disease)   . Anxiety   . Depression   . Bipolar disorder [facility])    Past Surgical History  Procedure Laterality Date  . Inguinal hernia repair Bilateral ~ 1983- ~ 1986  . Tonsillectomy  ~ 1985  . Circumcision    . Amputation Left 08/03/2015    Procedure: AMPUTATION LEFT GREAT TOE;  Surgeon: Newt Minion, MD;  Location: [place];  Service: Orthopedics;  Laterality: Left;  . I&d extremity Left 12/02/2015    Procedure: IRRIGATION AND DEBRIDEMENT OF FOOT; LEFT SECOND TOE AMPUTATION;  Surgeon: Meredith Pel, MD;  Location: [place];  Service: Orthopedics;   Laterality: Left;  . Amputation Left 12/02/2015    Procedure: amputation of left 2nd digit   Family History  Problem Relation Age of Onset  . Hypertension Mother   . Diabetes Mother   . Hyperlipidemia Mother   . Heart disease Mother     s/p pacemaker  . Diabetes Father   . Hypertension Father   . Stroke Father   . Heart attack Father     first MI @ 46.  . Stroke Brother    Social History  Substance Use Topics  . Smoking status: Never Smoker   . Smokeless tobacco: Never Used  . Alcohol Use: No    Review of Systems  Constitutional: Negative for fever and chills.  Neurological: Positive for numbness. Negative for weakness.  All other systems reviewed and are negative.     Allergies  Eggs or egg-derived products; Other; and Milk-related compounds  Home Medications   Prior to Admission medications   Medication Sig Start Date End Date Taking? Authorizing Provider  amLODipine (NORVASC) 5 MG tablet Take 1 tablet (5 mg total) by mouth daily. 09/04/15  Yes Maren Reamer, MD  amoxicillin-clavulanate (AUGMENTIN) 875-125 MG tablet Take 1 tablet by mouth 2 (two) times daily. 12/12/15  Yes Reyne Dumas, MD  aspirin EC 325 MG tablet Take 1 tablet (325 mg total) by mouth daily. 09/04/15  Yes Lester,  MD  atorvastatin (LIPITOR) 80 MG tablet Take 1 tablet (80 mg total) by mouth every morning. 08/18/15  Yes Josalyn Funches, MD  carvedilol (COREG) 12.5 MG tablet TAKE 1 TABLET BY MOUTH 2 TIMES DAILY WITH A MEAL Patient taking differently: TAKE 1 TABLET BY MOUTH IN THE MORNING. 05/17/15  Yes Josalyn Funches, MD  divalproex (DEPAKOTE) 500 MG DR tablet Take 2 tablets (1,000 mg total) by mouth at bedtime. 12/04/15  Yes Venetia Maxon Rama, MD  ferrous sulfate 325 (65 FE) MG tablet Take 1 tablet (325 mg total) by mouth 2 (two) times daily with a meal. Patient taking differently: Take 325 mg by mouth daily with breakfast.  08/18/15  Yes Josalyn Funches, MD  FLUoxetine (PROZAC) 10 MG tablet Take  10 mg by mouth daily.   Yes Historical Provider, MD  gabapentin (NEURONTIN) 100 MG capsule Take 1 capsule (100 mg total) by mouth 3 (three) times daily. Patient taking differently: Take 100 mg by mouth daily.  09/04/15  Yes Dawn Lazarus Gowda, MD  glipiZIDE (GLUCOTROL) 10 MG tablet Take 1 tablet (10 mg total) by mouth 2 (two) times daily before a meal. Patient taking differently: Take 10 mg by mouth daily before breakfast.  08/18/15  Yes Josalyn Funches, MD  lisinopril (PRINIVIL,ZESTRIL) 40 MG tablet Take 1 tablet (40 mg total) by mouth daily. 08/18/15  Yes Boykin Nearing, MD  metFORMIN (GLUCOPHAGE) 1000 MG tablet Take 1 tablet (1,000 mg total) by mouth 2 (two) times daily with a meal. Patient taking differently: Take 1,000 mg by mouth daily with breakfast.  08/18/15  Yes Josalyn Funches, MD  omeprazole (PRILOSEC) 20 MG capsule Take 1 capsule (20 mg total) by mouth daily. 08/18/15  Yes Josalyn Funches, MD  oxyCODONE-acetaminophen (PERCOCET/ROXICET) 5-325 MG tablet Take 2 tablets by mouth every 6 (six) hours as needed for severe pain. 12/04/15  Yes Christina P Rama, MD  sulfamethoxazole-trimethoprim (BACTRIM DS,SEPTRA DS) 800-160 MG tablet Take 1 tablet by mouth 2 (two) times daily. 12/12/15  Yes Reyne Dumas, MD  traZODone (DESYREL) 50 MG tablet Take 0.5-1 tablets (25-50 mg total) by mouth at bedtime as needed for sleep. 11/21/15  Yes Josalyn Funches, MD   BP 157/91 mmHg  Pulse 89  Temp(Src) 97.7 F (36.5 C) (Oral)  Resp 20  SpO2 98% Physical Exam  Constitutional: He appears well-developed and well-nourished. No distress.  HENT:  Head: Normocephalic and atraumatic.  Eyes: Conjunctivae are normal.  Neck: Neck supple.  Cardiovascular: Normal rate, regular rhythm and intact distal pulses.   Dorsalis pedis and posterior tibial pulses intact on the left. Circulation intact to the tips of the left toes with capillary refill under 2 seconds.  Pulmonary/Chest: Effort normal. No respiratory distress.    Abdominal: There is no guarding.  Musculoskeletal: He exhibits no edema or tenderness.  Patient has full range of motion at the ankle and in the toes.  Lymphadenopathy:    He has no cervical adenopathy.  Neurological: He is alert. He has normal reflexes.  Numbness to light touch, deep touch, and pain to the entire left foot inferior to the malleoli. Strength in the patient's entire ROM of the left foot and toes is 5 out of 5.  Skin: Skin is warm and dry. He is not diaphoretic.     Psychiatric: He has a normal mood and affect. His behavior is normal.  Nursing note and vitals reviewed.   ED Course  Procedures (including critical care time)  Imaging Review Dg Foot Complete Left  12/14/2015  CLINICAL DATA:  Awoke with complete numbness LEFT foot, no feeling, no pain, surgery 1 week ago with partial amputation of second toe and I & D of infected area, swelling, history type II diabetes mellitus, CHF, hyperlipidemia, hypertension EXAM: LEFT FOOT - COMPLETE 3+ VIEW COMPARISON:  11/30/2015 FINDINGS: Osseous mineralization normal. Prior amputation great toe. Interval amputation second toe through head of proximal phalanx. Soft tissue swelling of forefoot and second toe stump. No acute fracture, dislocation, or bone destruction. Scattered small vessel vascular calcification. IMPRESSION: Interval amputation of second toe through head of proximal phalanx. Prior amputation great toe. No acute osseous abnormalities. Electronically Signed   By: Lavonia Dana M.D.   On: 12/14/2015 15:15   I have personally reviewed and evaluated these images as part of my medical decision-making.   EKG Interpretation None      MDM   Final diagnoses:  Numbness of left foot    Shane [name] presents with left foot numbness beginning this morning.  Findings and plan of care discussed with Shane Pew, MD. Dr. Dayna Barker personally evaluated and examined this patient.  The patient's numbness this reaction does not seem  to follow a dermatomal pattern or known nerve distribution. Still has full motor and strength intact. The foot is swollen, but is soft to touch and pulses and circulation are intact. 2:34 PM Spoke with Dr. Cristobal Goldmann, Neurologist, who stated that the patient's numbness pattern could be due to tarsal tunnel. Recommends Xray then outpatient EMG and neurology follow-up. 3:41 PM Spoke with Dr. Ninfa Linden, orthopedic surgeon, who states as long as the patient has motor and circulation, it does not sound like an emergent issue and does not make sense for any acute dysfunction he is aware of. States that it does not seem to fit with a acute neuro deficit, but rather fits more with stocking glove distribution of neuropathy. Recommends at home observation, elevation and follow up with Dr. Marlou Sa in the office. The above information was communicated with the patient. Return precautions discussed. Patient voiced understanding of these instructions, accepts the plan, and is comfortable with discharge.    Lorayne Bender, PA-C 12/14/15 [place], PA-C 12/14/15 1619  Shane Pew, MD 12/15/15 [phone]

## 2015-12-14 NOTE — ED Notes (Signed)
Pt is in stable condition upon d/c and is escorted from ED via wheelchair. 

## 2015-12-14 NOTE — ED Notes (Signed)
Patient transported to X-ray 

## 2015-12-14 NOTE — Discharge Instructions (Signed)
You have been seen today for foot numbness. Your imaging showed no acute abnormalities. Follow-up with orthopedics as soon as possible to reevaluate the foot and the wound. Follow up with neurology as soon as possible to evaluate the numbness in the foot. Follow up with PCP as needed. Return to ED should symptoms worsen.  Do not bear weight on the foot and keep it elevated as much as possible.

## 2015-12-14 NOTE — ED Notes (Signed)
Pt here sts can not feel left foot after having great toe amputated 1 week ago; foot is warm to touch with good pulse but pt sts still can not feel foot; drainage noted from wound and pt is able to wiggle toes

## 2015-12-15 NOTE — ED Provider Notes (Signed)
Medical screening examination/treatment/procedure(s) were conducted as a shared visit with non-physician practitioner(s) and myself.  I personally evaluated the patient during the encounter.  45 year old male with numbness to his whole foot below the ankle. Recently was admitted for concern of stroke however ultimately found to not likely have had one. Has significant swelling to that foot and has an open wound to the top has been being taken care of by Dr. Marlou Sa.   on my exam he has swelling of the foot and lower leg. He has a normal DP pulse on that side. The wound overall appears to be clean and dry and is not draining at this time. Compartments are soft. Leg not painful.  A low suspicion for compartment syndrome, central nervous system disorders. we discussed the case with on-call neurology and he felt like is more consistent with tarsal tunnel syndrome secondary to swelling. They are recommending an x-ray to ensure we were not missing a fracture and have him follow up in neurology clinic.  Merrily Pew, MD 12/15/15 [phone]

## 2015-12-16 ENCOUNTER — Encounter (HOSPITAL_COMMUNITY): Payer: Self-pay

## 2015-12-16 ENCOUNTER — Emergency Department (HOSPITAL_COMMUNITY): Payer: Medicaid Other

## 2015-12-16 ENCOUNTER — Inpatient Hospital Stay (HOSPITAL_COMMUNITY)
Admission: EM | Admit: 2015-12-16 | Discharge: 2015-12-23 | DRG: 463 | Disposition: A | Discharge status: Home Health | Payer: Medicaid Other | Attending: Internal Medicine | Admitting: Internal Medicine

## 2015-12-16 DIAGNOSIS — Z91018 Allergy to other foods: Secondary | ICD-10-CM

## 2015-12-16 DIAGNOSIS — R000 Tachycardia, unspecified: Secondary | ICD-10-CM | POA: Diagnosis present

## 2015-12-16 DIAGNOSIS — R200 Anesthesia of skin: Secondary | ICD-10-CM | POA: Diagnosis present

## 2015-12-16 DIAGNOSIS — F319 Bipolar disorder, unspecified: Secondary | ICD-10-CM | POA: Diagnosis present

## 2015-12-16 DIAGNOSIS — Z8249 Family history of ischemic heart disease and other diseases of the circulatory system: Secondary | ICD-10-CM | POA: Diagnosis not present

## 2015-12-16 DIAGNOSIS — A419 Sepsis, unspecified organism: Secondary | ICD-10-CM | POA: Diagnosis present

## 2015-12-16 DIAGNOSIS — F3481 Disruptive mood dysregulation disorder: Secondary | ICD-10-CM | POA: Diagnosis present

## 2015-12-16 DIAGNOSIS — IMO0002 Reserved for concepts with insufficient information to code with codable children: Secondary | ICD-10-CM | POA: Diagnosis present

## 2015-12-16 DIAGNOSIS — D649 Anemia, unspecified: Secondary | ICD-10-CM | POA: Diagnosis present

## 2015-12-16 DIAGNOSIS — L03116 Cellulitis of left lower limb: Secondary | ICD-10-CM | POA: Diagnosis present

## 2015-12-16 DIAGNOSIS — E1151 Type 2 diabetes mellitus with diabetic peripheral angiopathy without gangrene: Secondary | ICD-10-CM | POA: Diagnosis present

## 2015-12-16 DIAGNOSIS — E118 Type 2 diabetes mellitus with unspecified complications: Secondary | ICD-10-CM

## 2015-12-16 DIAGNOSIS — G4730 Sleep apnea, unspecified: Secondary | ICD-10-CM | POA: Diagnosis present

## 2015-12-16 DIAGNOSIS — K219 Gastro-esophageal reflux disease without esophagitis: Secondary | ICD-10-CM | POA: Diagnosis present

## 2015-12-16 DIAGNOSIS — I959 Hypotension, unspecified: Secondary | ICD-10-CM | POA: Diagnosis present

## 2015-12-16 DIAGNOSIS — E785 Hyperlipidemia, unspecified: Secondary | ICD-10-CM | POA: Diagnosis present

## 2015-12-16 DIAGNOSIS — Z91011 Allergy to milk products: Secondary | ICD-10-CM

## 2015-12-16 DIAGNOSIS — Z7984 Long term (current) use of oral hypoglycemic drugs: Secondary | ICD-10-CM | POA: Diagnosis not present

## 2015-12-16 DIAGNOSIS — T8781 Dehiscence of amputation stump: Principal | ICD-10-CM | POA: Diagnosis present

## 2015-12-16 DIAGNOSIS — Z89422 Acquired absence of other left toe(s): Secondary | ICD-10-CM | POA: Diagnosis not present

## 2015-12-16 DIAGNOSIS — E1142 Type 2 diabetes mellitus with diabetic polyneuropathy: Secondary | ICD-10-CM | POA: Diagnosis present

## 2015-12-16 DIAGNOSIS — I5032 Chronic diastolic (congestive) heart failure: Secondary | ICD-10-CM | POA: Diagnosis present

## 2015-12-16 DIAGNOSIS — R531 Weakness: Secondary | ICD-10-CM

## 2015-12-16 DIAGNOSIS — R6510 Systemic inflammatory response syndrome (SIRS) of non-infectious origin without acute organ dysfunction: Secondary | ICD-10-CM | POA: Diagnosis present

## 2015-12-16 DIAGNOSIS — Z8673 Personal history of transient ischemic attack (TIA), and cerebral infarction without residual deficits: Secondary | ICD-10-CM | POA: Diagnosis not present

## 2015-12-16 DIAGNOSIS — Z7982 Long term (current) use of aspirin: Secondary | ICD-10-CM | POA: Diagnosis not present

## 2015-12-16 DIAGNOSIS — R29898 Other symptoms and signs involving the musculoskeletal system: Secondary | ICD-10-CM

## 2015-12-16 DIAGNOSIS — Z91012 Allergy to eggs: Secondary | ICD-10-CM

## 2015-12-16 DIAGNOSIS — F419 Anxiety disorder, unspecified: Secondary | ICD-10-CM | POA: Diagnosis present

## 2015-12-16 DIAGNOSIS — L03119 Cellulitis of unspecified part of limb: Secondary | ICD-10-CM | POA: Diagnosis present

## 2015-12-16 DIAGNOSIS — E1159 Type 2 diabetes mellitus with other circulatory complications: Secondary | ICD-10-CM

## 2015-12-16 DIAGNOSIS — E1165 Type 2 diabetes mellitus with hyperglycemia: Secondary | ICD-10-CM | POA: Diagnosis present

## 2015-12-16 DIAGNOSIS — T8130XA Disruption of wound, unspecified, initial encounter: Secondary | ICD-10-CM | POA: Diagnosis present

## 2015-12-16 DIAGNOSIS — E1149 Type 2 diabetes mellitus with other diabetic neurological complication: Secondary | ICD-10-CM

## 2015-12-16 DIAGNOSIS — I82402 Acute embolism and thrombosis of unspecified deep veins of left lower extremity: Secondary | ICD-10-CM

## 2015-12-16 DIAGNOSIS — I10 Essential (primary) hypertension: Secondary | ICD-10-CM

## 2015-12-16 DIAGNOSIS — Z6825 Body mass index (BMI) 25.0-25.9, adult: Secondary | ICD-10-CM | POA: Diagnosis not present

## 2015-12-16 DIAGNOSIS — I110 Hypertensive heart disease with heart failure: Secondary | ICD-10-CM | POA: Diagnosis present

## 2015-12-16 DIAGNOSIS — Z79899 Other long term (current) drug therapy: Secondary | ICD-10-CM

## 2015-12-16 DIAGNOSIS — L089 Local infection of the skin and subcutaneous tissue, unspecified: Secondary | ICD-10-CM | POA: Diagnosis present

## 2015-12-16 DIAGNOSIS — T148XXA Other injury of unspecified body region, initial encounter: Secondary | ICD-10-CM

## 2015-12-16 LAB — COMPREHENSIVE METABOLIC PANEL
ALT: 24 U/L (ref 17–63)
AST: 20 U/L (ref 15–41)
Albumin: 3.7 g/dL (ref 3.5–5.0)
Alkaline Phosphatase: 48 U/L (ref 38–126)
Anion gap: 8 (ref 5–15)
BILIRUBIN TOTAL: 0.4 mg/dL (ref 0.3–1.2)
BUN: 21 mg/dL — AB (ref 6–20)
CO2: 24 mmol/L (ref 22–32)
CREATININE: 1.34 mg/dL — AB (ref 0.61–1.24)
Calcium: 9.6 mg/dL (ref 8.9–10.3)
Chloride: 110 mmol/L (ref 101–111)
GFR calc Af Amer: >60 mL/min (ref >60)
Glucose, Bld: 90 mg/dL (ref 65–99)
POTASSIUM: 4.5 mmol/L (ref 3.5–5.1)
Sodium: 142 mmol/L (ref 135–145)
TOTAL PROTEIN: 7.2 g/dL (ref 6.5–8.1)

## 2015-12-16 LAB — CBC WITH DIFFERENTIAL/PLATELET
BASOS PCT: 0 %
Basophils Absolute: 0 10*3/uL (ref 0.0–0.1)
EOS ABS: 0.2 10*3/uL (ref 0.0–0.7)
EOS PCT: 2 %
HCT: 33.9 % — ABNORMAL LOW (ref 39.0–52.0)
Hemoglobin: 10.7 g/dL — ABNORMAL LOW (ref 13.0–17.0)
Lymphocytes Relative: 26 %
Lymphs Abs: 1.9 10*3/uL (ref 0.7–4.0)
MCH: 30.1 pg (ref 26.0–34.0)
MCHC: 31.6 g/dL (ref 30.0–36.0)
MCV: 95.5 fL (ref 78.0–100.0)
MONO ABS: 0.3 10*3/uL (ref 0.1–1.0)
MONOS PCT: 4 %
NEUTROS PCT: 68 %
Neutro Abs: 5 10*3/uL (ref 1.7–7.7)
PLATELETS: 442 10*3/uL — AB (ref 150–400)
RBC: 3.55 MIL/uL — ABNORMAL LOW (ref 4.22–5.81)
RDW: 13.4 % (ref 11.5–15.5)
WBC: 7.4 10*3/uL (ref 4.0–10.5)

## 2015-12-16 LAB — I-STAT CG4 LACTIC ACID, ED
LACTIC ACID, VENOUS: 3.64 mmol/L — AB (ref 0.5–1.9)
LACTIC ACID, VENOUS: 1.87 mmol/L (ref 0.5–1.9)

## 2015-12-16 LAB — GLUCOSE, CAPILLARY: GLUCOSE-CAPILLARY: 121 mg/dL — AB (ref 65–99)

## 2015-12-16 MED ORDER — TRAZODONE HCL 50 MG PO TABS
25.0000 mg | ORAL_TABLET | Freq: Every evening | ORAL | Status: DC | PRN
  Administered 2015-12-17 – 2015-12-21 (×3): 50 mg via ORAL
  Administered 2015-12-22: 25 mg via ORAL
  Filled 2015-12-16 (×6): qty 1

## 2015-12-16 MED ORDER — INSULIN ASPART 100 UNIT/ML ~~LOC~~ SOLN
0.0000 [IU] | Freq: Three times a day (TID) | SUBCUTANEOUS | Status: DC
  Administered 2015-12-17 – 2015-12-18 (×3): 2 [IU] via SUBCUTANEOUS
  Administered 2015-12-18: 3 [IU] via SUBCUTANEOUS
  Administered 2015-12-18: 2 [IU] via SUBCUTANEOUS
  Administered 2015-12-19: 5 [IU] via SUBCUTANEOUS
  Administered 2015-12-19 – 2015-12-20 (×4): 2 [IU] via SUBCUTANEOUS
  Administered 2015-12-20 – 2015-12-21 (×2): 3 [IU] via SUBCUTANEOUS
  Administered 2015-12-21: 2 [IU] via SUBCUTANEOUS
  Administered 2015-12-21: 3 [IU] via SUBCUTANEOUS
  Administered 2015-12-22: 5 [IU] via SUBCUTANEOUS
  Administered 2015-12-22: 3 [IU] via SUBCUTANEOUS
  Administered 2015-12-22: 5 [IU] via SUBCUTANEOUS
  Administered 2015-12-23: 2 [IU] via SUBCUTANEOUS
  Administered 2015-12-23: 3 [IU] via SUBCUTANEOUS

## 2015-12-16 MED ORDER — GLIPIZIDE 5 MG PO TABS
10.0000 mg | ORAL_TABLET | Freq: Every day | ORAL | Status: DC
  Administered 2015-12-17: 10 mg via ORAL
  Filled 2015-12-16: qty 2

## 2015-12-16 MED ORDER — PIPERACILLIN-TAZOBACTAM 3.375 G IVPB
3.3750 g | Freq: Three times a day (TID) | INTRAVENOUS | Status: DC
  Administered 2015-12-16 – 2015-12-21 (×12): 3.375 g via INTRAVENOUS
  Filled 2015-12-16 (×18): qty 50

## 2015-12-16 MED ORDER — ATORVASTATIN CALCIUM 80 MG PO TABS
80.0000 mg | ORAL_TABLET | Freq: Every day | ORAL | Status: DC
  Administered 2015-12-17 – 2015-12-23 (×7): 80 mg via ORAL
  Filled 2015-12-16 (×7): qty 1

## 2015-12-16 MED ORDER — PANTOPRAZOLE SODIUM 40 MG PO TBEC
40.0000 mg | DELAYED_RELEASE_TABLET | Freq: Every day | ORAL | Status: DC
Start: 2015-12-17 — End: 2015-12-23
  Administered 2015-12-17 – 2015-12-23 (×7): 40 mg via ORAL
  Filled 2015-12-16 (×7): qty 1

## 2015-12-16 MED ORDER — ONDANSETRON HCL 4 MG PO TABS: 4.0000 mg | ORAL_TABLET | Freq: Four times a day (QID) | ORAL | Status: DC | PRN

## 2015-12-16 MED ORDER — ONDANSETRON HCL 4 MG/2ML IJ SOLN: 4.0000 mg | Freq: Four times a day (QID) | INTRAMUSCULAR | Status: DC | PRN

## 2015-12-16 MED ORDER — VANCOMYCIN HCL 10 G IV SOLR
1750.0000 mg | Freq: Once | INTRAVENOUS | Status: AC
  Administered 2015-12-16: 1750 mg via INTRAVENOUS
  Filled 2015-12-16: qty 1750

## 2015-12-16 MED ORDER — FERROUS SULFATE 325 (65 FE) MG PO TABS
325.0000 mg | ORAL_TABLET | Freq: Every day | ORAL | Status: DC
  Administered 2015-12-17 – 2015-12-23 (×7): 325 mg via ORAL
  Filled 2015-12-16 (×7): qty 1

## 2015-12-16 MED ORDER — SODIUM CHLORIDE 0.9 % IV SOLN
INTRAVENOUS | Status: DC
  Administered 2015-12-16 – 2015-12-17 (×2): via INTRAVENOUS

## 2015-12-16 MED ORDER — DIVALPROEX SODIUM 500 MG PO DR TAB
1000.0000 mg | DELAYED_RELEASE_TABLET | Freq: Every day | ORAL | Status: DC
  Administered 2015-12-16 – 2015-12-22 (×7): 1000 mg via ORAL
  Filled 2015-12-16 (×9): qty 2

## 2015-12-16 MED ORDER — SODIUM CHLORIDE 0.9 % IV BOLUS (SEPSIS)
1000.0000 mL | Freq: Once | INTRAVENOUS | Status: AC
  Administered 2015-12-16: 1000 mL via INTRAVENOUS

## 2015-12-16 MED ORDER — OXYCODONE-ACETAMINOPHEN 5-325 MG PO TABS
1.0000 | ORAL_TABLET | Freq: Every day | ORAL | Status: DC | PRN
  Administered 2015-12-20: 1 via ORAL
  Filled 2015-12-16: qty 1

## 2015-12-16 MED ORDER — PIPERACILLIN-TAZOBACTAM 3.375 G IVPB
3.3750 g | Freq: Once | INTRAVENOUS | Status: AC
  Administered 2015-12-16: 3.375 g via INTRAVENOUS
  Filled 2015-12-16: qty 50

## 2015-12-16 MED ORDER — FLUOXETINE HCL 20 MG PO TABS
10.0000 mg | ORAL_TABLET | Freq: Every day | ORAL | Status: DC
  Filled 2015-12-16: qty 1

## 2015-12-16 MED ORDER — VANCOMYCIN HCL IN DEXTROSE 1-5 GM/200ML-% IV SOLN
1000.0000 mg | Freq: Two times a day (BID) | INTRAVENOUS | Status: DC
  Administered 2015-12-17 – 2015-12-22 (×11): 1000 mg via INTRAVENOUS
  Filled 2015-12-16 (×13): qty 200

## 2015-12-16 MED ORDER — GABAPENTIN 100 MG PO CAPS
100.0000 mg | ORAL_CAPSULE | Freq: Every day | ORAL | Status: DC
  Administered 2015-12-17 – 2015-12-22 (×6): 100 mg via ORAL
  Filled 2015-12-16 (×6): qty 1

## 2015-12-16 MED ORDER — ACETAMINOPHEN 325 MG PO TABS: 650.0000 mg | ORAL_TABLET | Freq: Four times a day (QID) | ORAL | Status: DC | PRN

## 2015-12-16 MED ORDER — ACETAMINOPHEN 650 MG RE SUPP: 650.0000 mg | Freq: Four times a day (QID) | RECTAL | Status: DC | PRN

## 2015-12-16 MED ORDER — SODIUM CHLORIDE 0.9 % IV BOLUS (SEPSIS): 250.0000 mL | Freq: Once | INTRAVENOUS | Status: DC

## 2015-12-16 NOTE — ED Notes (Signed)
Called 5W, RN to call back for report

## 2015-12-16 NOTE — ED Notes (Addendum)
patient here for wound re-check. Seen on Thursday in ED for same, has ongoing numbness to same. Arrived in wooden shoe. Patient slightly diaphoretic, denies new drainage from foot, had toe amputation several days ago.

## 2015-12-16 NOTE — ED Notes (Signed)
Pt returns from XR.

## 2015-12-16 NOTE — ED Notes (Signed)
[facility] contacted to call Code Sepsis 

## 2015-12-16 NOTE — Progress Notes (Signed)
Pharmacy Antibiotic Note  Shane [name] is a 45 y.o. male admitted on 12/16/2015 with wound infection.  Pharmacy has been consulted for zosyn and vancomycin dosing.  Plan: Zosyn 3.375 gm iv q8h Vancomycin 1750 mg x 1 then 1q 12h Monitor renal fx, cx, vt prn  Height: 5\' 8"  (172.7 cm) Weight: 168 lb (76.204 kg) IBW/kg (Calculated) : 68.4  Temp (24hrs), Avg:98.1 F (36.7 C), Min:98 F (36.7 C), Max:98.1 F (36.7 C)   Recent Labs Lab 12/10/15 2039 12/10/15 2053 12/12/15 0517 12/16/15 1515 12/16/15 1537  WBC 8.1  --  8.3 7.4  --   CREATININE 1.32* 1.30* 1.41* 1.34*  --   LATICACIDVEN  --   --   --   --  3.64*    Estimated Creatinine Clearance: 66.6 mL/min (by C-G formula based on Cr of 1.34).    Allergies  Allergen Reactions  . Eggs Or Egg-Derived Products Diarrhea  . Other Other (See Comments)    Red meat causes stomach pains, bloating and diarrhea  . Milk-Related Compounds Diarrhea and Other (See Comments)    Any dairy products  - diarrhea and bloating    Antimicrobials this admission: Zosyn 7/1>> Vancomycin 7/1>>  Microbiology results: 7/1 Blood x2 7/1 Urine  Levester Fresh, PharmD, BCPS, [facility] Clinical Pharmacist Pager [phone] 12/16/2015 5:17 PM

## 2015-12-16 NOTE — ED Notes (Signed)
PA requests that I hold the last 2108ml of the NS Bolus at this time

## 2015-12-16 NOTE — ED Provider Notes (Signed)
CSN: [id number]     Arrival date & time 12/16/15  1454 History   First MD Initiated Contact with Patient 12/16/15 1519     Chief Complaint  Patient presents with  . foot infection    (Consider location/radiation/quality/duration/timing/severity/associated sxs/prior Treatment) HPI 45 y.o. male with a hx of DM Type 2, HTN, presents to the Emergency Department today complaining of worsening left foot numbness. Seen on 12-14-15 for similar due to new onset numbness s/p amputation x 1 week ago by Dr. Marlou Sa. Pt had second toe amputation and dorsal foot I&D of an abscess on June 17th. Visit on 12-14-15 had neurologist consulted and told that it could be tarsal tunnel. Today, pt presents due to continued symptoms. Reports ongoing chills/nightsweats. No fevers. No CP/SOB/ABD pain. Pt is unable to feel foot from below ankle. No pain. Given ABX Augmentin/Septra on 6/19 x 5day course. Of note, pt does not endorse decrease in PO intake, was not outside in heat, no N/V/D to explain hypotension on arrival. No other symptoms noted.   Past Medical History  Diagnosis Date  . Hypertension     a. 08/2014 Admitted with hypertensive urgency.  . Chest pain     a. 2015 Reportedly normal stress test in [place].  Marland Kitchen TIA (transient ischemic attack) 08/2014; 03/2015    a. 08/2014 in setting of hypertensive urgency.  . Anemia   . Refusal of blood transfusions as patient is Jehovah's Witness   . Hyperlipidemia   . Chronic diastolic CHF (congestive heart failure) (HCC)     a.03/2015 Echo: EF 55-60%, Gr 1 DD, mild MR, triv PR.  . Type II diabetes mellitus ([place])   . Sleep apnea   . GERD (gastroesophageal reflux disease)   . Anxiety   . Depression   . Bipolar disorder [facility])    Past Surgical History  Procedure Laterality Date  . Inguinal hernia repair Bilateral ~ 1983- ~ 1986  . Tonsillectomy  ~ 1985  . Circumcision    . Amputation Left 08/03/2015    Procedure: AMPUTATION LEFT GREAT TOE;  Surgeon: Newt Minion, MD;  Location:  [place];  Service: Orthopedics;  Laterality: Left;  . I&d extremity Left 12/02/2015    Procedure: IRRIGATION AND DEBRIDEMENT OF FOOT; LEFT SECOND TOE AMPUTATION;  Surgeon: Meredith Pel, MD;  Location: [place];  Service: Orthopedics;  Laterality: Left;  . Amputation Left 12/02/2015    Procedure: amputation of left 2nd digit   Family History  Problem Relation Age of Onset  . Hypertension Mother   . Diabetes Mother   . Hyperlipidemia Mother   . Heart disease Mother     s/p pacemaker  . Diabetes Father   . Hypertension Father   . Stroke Father   . Heart attack Father     first MI @ 15.  . Stroke Brother    Social History  Substance Use Topics  . Smoking status: Never Smoker   . Smokeless tobacco: Never Used  . Alcohol Use: No    Review of Systems ROS reviewed and all are negative for acute change except as noted in the HPI.   Allergies  Eggs or egg-derived products; Other; and Milk-related compounds  Home Medications   Prior to Admission medications   Medication Sig Start Date End Date Taking? Authorizing Provider  amLODipine (NORVASC) 5 MG tablet Take 1 tablet (5 mg total) by mouth daily. 09/04/15  Yes Maren Reamer, MD  amoxicillin-clavulanate (AUGMENTIN) 875-125 MG tablet Take 1 tablet by mouth  2 (two) times daily. 12/12/15  Yes Reyne Dumas, MD  aspirin EC 325 MG tablet Take 1 tablet (325 mg total) by mouth daily. Patient taking differently: Take 325 mg by mouth every morning.  09/04/15  Yes Maren Reamer, MD  atorvastatin (LIPITOR) 80 MG tablet Take 1 tablet (80 mg total) by mouth every morning. 08/18/15  Yes Josalyn Funches, MD  carvedilol (COREG) 12.5 MG tablet TAKE 1 TABLET BY MOUTH 2 TIMES DAILY WITH A MEAL Patient taking differently: TAKE 1 TABLET BY MOUTH IN THE MORNING. 05/17/15  Yes Josalyn Funches, MD  divalproex (DEPAKOTE) 500 MG DR tablet Take 2 tablets (1,000 mg total) by mouth at bedtime. 12/04/15  Yes Venetia Maxon Rama, MD  ferrous sulfate 325 (65 FE) MG  tablet Take 1 tablet (325 mg total) by mouth 2 (two) times daily with a meal. Patient taking differently: Take 325 mg by mouth daily with breakfast.  08/18/15  Yes Josalyn Funches, MD  FLUoxetine (PROZAC) 10 MG tablet Take 10 mg by mouth daily.   Yes Historical Provider, MD  gabapentin (NEURONTIN) 100 MG capsule Take 1 capsule (100 mg total) by mouth 3 (three) times daily. Patient taking differently: Take 100 mg by mouth daily.  09/04/15  Yes Dawn Lazarus Gowda, MD  glipiZIDE (GLUCOTROL) 10 MG tablet Take 1 tablet (10 mg total) by mouth 2 (two) times daily before a meal. Patient taking differently: Take 10 mg by mouth daily before breakfast.  08/18/15  Yes Josalyn Funches, MD  lisinopril (PRINIVIL,ZESTRIL) 40 MG tablet Take 1 tablet (40 mg total) by mouth daily. 08/18/15  Yes Boykin Nearing, MD  metFORMIN (GLUCOPHAGE) 1000 MG tablet Take 1 tablet (1,000 mg total) by mouth 2 (two) times daily with a meal. Patient taking differently: Take 1,000 mg by mouth daily with breakfast.  08/18/15  Yes Josalyn Funches, MD  omeprazole (PRILOSEC) 20 MG capsule Take 1 capsule (20 mg total) by mouth daily. 08/18/15  Yes Josalyn Funches, MD  oxyCODONE-acetaminophen (PERCOCET/ROXICET) 5-325 MG tablet Take 2 tablets by mouth every 6 (six) hours as needed for severe pain. Patient taking differently: Take 1 tablet by mouth daily as needed for moderate pain or severe pain.  12/04/15  Yes Christina P Rama, MD  sulfamethoxazole-trimethoprim (BACTRIM DS,SEPTRA DS) 800-160 MG tablet Take 1 tablet by mouth 2 (two) times daily. 12/12/15  Yes Reyne Dumas, MD  traZODone (DESYREL) 50 MG tablet Take 0.5-1 tablets (25-50 mg total) by mouth at bedtime as needed for sleep. 11/21/15  Yes Josalyn Funches, MD   BP 103/66 mmHg  Pulse 83  Temp(Src) 98 F (36.7 C)  Resp 18  Ht 5\' 8"  (1.727 m)  Wt 76.204 kg  BMI 25.55 kg/m2  SpO2 100%   Physical Exam  Constitutional: He is oriented to person, place, and time. He appears well-developed and  well-nourished.  HENT:  Head: Normocephalic and atraumatic.  Eyes: EOM are normal. Pupils are equal, round, and reactive to light.  Neck: Normal range of motion. Neck supple.  Cardiovascular: Normal rate, regular rhythm, normal heart sounds and intact distal pulses.   No murmur heard. Pulmonary/Chest: Effort normal and breath sounds normal. No respiratory distress. He has no wheezes. He has no rales. He exhibits no tenderness.  Abdominal: Soft.  Musculoskeletal: Normal range of motion.       Left ankle: Normal.  Distal pulses intact. Capillary refill <2 sec. Full ROM ankle and toes. Compartments soft.   Neurological: He is alert and oriented to person, place, and time.  Numbness to light/deep touch. Strength 5/5.   Skin: Skin is warm and dry.  I&D site noted on dorsum of right foot. Slight erythema. No purulence. Swelling noted. 1st and 2nd Digits surgically absent   Psychiatric: He has a normal mood and affect. His behavior is normal. Thought content normal.  Nursing note and vitals reviewed.  ED Course  Procedures (including critical care time) Labs Review Labs Reviewed  COMPREHENSIVE METABOLIC PANEL - Abnormal; Notable for the following:    BUN 21 (*)    Creatinine, Ser 1.34 (*)    All other components within normal limits  CBC WITH DIFFERENTIAL/PLATELET - Abnormal; Notable for the following:    RBC 3.55 (*)    Hemoglobin 10.7 (*)    HCT 33.9 (*)    Platelets 442 (*)    All other components within normal limits  I-STAT CG4 LACTIC ACID, ED - Abnormal; Notable for the following:    Lactic Acid, Venous 3.64 (*)    All other components within normal limits  CULTURE, BLOOD (ROUTINE X 2)  CULTURE, BLOOD (ROUTINE X 2)  URINE CULTURE  URINALYSIS, ROUTINE W REFLEX MICROSCOPIC (NOT AT [facility])  I-STAT CG4 LACTIC ACID, ED   Imaging Review Dg Chest 2 View  12/16/2015  CLINICAL DATA:  Toe amputation recently.  Possible sepsis. EXAM: CHEST  2 VIEW COMPARISON:  12/11/2015 FINDINGS: Lungs  are adequately inflated without focal consolidation, effusion or pneumothorax. Cardiomediastinal silhouette and remainder of the exam is unchanged. IMPRESSION: No acute disease. Electronically Signed   By: Marin Olp M.D.   On: 12/16/2015 18:15   Dg Foot Complete Left  12/16/2015  CLINICAL DATA:  Left foot numbness.  Rule out osteomyelitis EXAM: LEFT FOOT - COMPLETE 3+ VIEW COMPARISON:  12/14/2015 FINDINGS: Amputation of the great toe at the level of the MTP joint. Amputation of the second toe at the level of the distal aspect of the proximal phalanx. Negative for osteomyelitis. No fracture or arthropathy. No change from the recent study. Arterial calcification. Wound on the dorsum of the foot. IMPRESSION: No interval change.  Negative for osteomyelitis. Electronically Signed   By: Franchot Gallo M.D.   On: 12/16/2015 19:11   I have personally reviewed and evaluated these images and lab results as part of my medical decision-making.   EKG Interpretation None      MDM  I have reviewed and evaluated the relevant laboratory values I have reviewed and evaluated the relevant imaging studies.  I have reviewed the relevant previous healthcare records. I obtained HPI from historian. Patient discussed with supervising physician  ED Course:  Assessment: Pt is a 44yM with hx DM, peripheral neuropathy, and CHF who presents with left foot numbness. Seen on 12-14-15 for similar. Neuro consult with possible tarsal tunnel at that time. Returns due to recurrent numbness. On exam, pt in Nontoxic/nonseptic appearing. VS with hypotension 85/68. Afebrile. Lungs CTA. Heart RRR. Abdomen nontender soft. Left foot with I+D noted. Absent 1st and 2nd digits due to surgical amputation. Slight erythema and welling. Minimal drainage on bandage. iStat Lactate 3.64. WBC 7.4. Code Sepsis initiated due to hypotension, tachycardia and possible infection. Cultures drawn. Made NPO. Given Vanc/Zosyn. Given 2L NS Bolus. CXR negative.  DG left foot negative for osteomyelitis. Discussed patient with supervising physician. Sepsis reassessment was completed at this time with improvement in BP with fluids. Will admit to medicine due to possible infection with possible source on open wound of left foot. Hypotension possibly related to infection.  Disposition/Plan:  Admit Pt acknowledges and agrees with plan  Supervising Physician Gareth Morgan, MD   Final diagnoses:  Wound infection [facility])      Shary Decamp, PA-C 12/16/15 1932  Gareth Morgan, MD 12/17/15 1513

## 2015-12-16 NOTE — ED Notes (Signed)
Call to 5W, RN to call back 

## 2015-12-16 NOTE — H&P (Signed)
History and Physical    Shane [name] [id number] DOB: [birth year] DOA: 12/16/2015  PCP: Minerva Ends, MD  Patient coming from: Home.  Chief Complaint: Left foot numbness and swelling and bloody discharge.  HPI: Shane [name] is a 45 y.o. male with diabetes mellitus type 2, hypertension, hyperlipidemia who was discharged on June 19 after being treated for left foot wound infection requiring second toe amputation and wound debridement and was again admitted and discharged 3 days ago for TIA presents to the ER for the second time with complaints of increasing swelling bloody discharge and numbness of the left foot for the last 3 days. Denies any weakness of upper or lower extremities. There is no foot drop. On exam there is mild discharge on the dressing on the left foot. Left foot looks swollen. Patient states he has been also having subjective feeling of fever chills. Patient has been admitted for further management. Patient states he did follow-up with orthopedic surgeon Dr. Randel Pigg office on Friday and had dressings changed.  ED Course: Patient has been empirically started on antibiotics. On arrival patient was hypotensive tachycardic which improved with IV fluid boluses for sepsis.  Review of Systems: As per HPI, rest all negative.   Past Medical History  Diagnosis Date  . Hypertension     a. 08/2014 Admitted with hypertensive urgency.  . Chest pain     a. 2015 Reportedly normal stress test in [place].  Marland Kitchen TIA (transient ischemic attack) 08/2014; 03/2015    a. 08/2014 in setting of hypertensive urgency.  . Anemia   . Refusal of blood transfusions as patient is Jehovah's Witness   . Hyperlipidemia   . Chronic diastolic CHF (congestive heart failure) (HCC)     a.03/2015 Echo: EF 55-60%, Gr 1 DD, mild MR, triv PR.  . Type II diabetes mellitus ([place])   . Sleep apnea   . GERD (gastroesophageal reflux disease)   . Anxiety   . Depression   . Bipolar disorder [facility])     Past Surgical  History  Procedure Laterality Date  . Inguinal hernia repair Bilateral ~ 1983- ~ 1986  . Tonsillectomy  ~ 1985  . Circumcision    . Amputation Left 08/03/2015    Procedure: AMPUTATION LEFT GREAT TOE;  Surgeon: Newt Minion, MD;  Location: [place];  Service: Orthopedics;  Laterality: Left;  . I&d extremity Left 12/02/2015    Procedure: IRRIGATION AND DEBRIDEMENT OF FOOT; LEFT SECOND TOE AMPUTATION;  Surgeon: Meredith Pel, MD;  Location: [place];  Service: Orthopedics;  Laterality: Left;  . Amputation Left 12/02/2015    Procedure: amputation of left 2nd digit     reports that he has never smoked. He has never used smokeless tobacco. He reports that he does not drink alcohol or use illicit drugs.  Allergies  Allergen Reactions  . Eggs Or Egg-Derived Products Diarrhea  . Other Other (See Comments)    Red meat causes stomach pains, bloating and diarrhea  . Milk-Related Compounds Diarrhea and Other (See Comments)    Any dairy products  - diarrhea and bloating    Family History  Problem Relation Age of Onset  . Hypertension Mother   . Diabetes Mother   . Hyperlipidemia Mother   . Heart disease Mother     s/p pacemaker  . Diabetes Father   . Hypertension Father   . Stroke Father   . Heart attack Father     first MI @ 76.  . Stroke Brother  Prior to Admission medications   Medication Sig Start Date End Date Taking? Authorizing Provider  amLODipine (NORVASC) 5 MG tablet Take 1 tablet (5 mg total) by mouth daily. 09/04/15  Yes Maren Reamer, MD  amoxicillin-clavulanate (AUGMENTIN) 875-125 MG tablet Take 1 tablet by mouth 2 (two) times daily. 12/12/15  Yes Reyne Dumas, MD  aspirin EC 325 MG tablet Take 1 tablet (325 mg total) by mouth daily. Patient taking differently: Take 325 mg by mouth every morning.  09/04/15  Yes Maren Reamer, MD  atorvastatin (LIPITOR) 80 MG tablet Take 1 tablet (80 mg total) by mouth every morning. 08/18/15  Yes Josalyn Funches, MD  carvedilol (COREG)  12.5 MG tablet TAKE 1 TABLET BY MOUTH 2 TIMES DAILY WITH A MEAL Patient taking differently: TAKE 1 TABLET BY MOUTH IN THE MORNING. 05/17/15  Yes Josalyn Funches, MD  divalproex (DEPAKOTE) 500 MG DR tablet Take 2 tablets (1,000 mg total) by mouth at bedtime. 12/04/15  Yes Venetia Maxon Rama, MD  ferrous sulfate 325 (65 FE) MG tablet Take 1 tablet (325 mg total) by mouth 2 (two) times daily with a meal. Patient taking differently: Take 325 mg by mouth daily with breakfast.  08/18/15  Yes Josalyn Funches, MD  FLUoxetine (PROZAC) 10 MG tablet Take 10 mg by mouth daily.   Yes Historical Provider, MD  gabapentin (NEURONTIN) 100 MG capsule Take 1 capsule (100 mg total) by mouth 3 (three) times daily. Patient taking differently: Take 100 mg by mouth daily.  09/04/15  Yes Dawn Lazarus Gowda, MD  glipiZIDE (GLUCOTROL) 10 MG tablet Take 1 tablet (10 mg total) by mouth 2 (two) times daily before a meal. Patient taking differently: Take 10 mg by mouth daily before breakfast.  08/18/15  Yes Josalyn Funches, MD  lisinopril (PRINIVIL,ZESTRIL) 40 MG tablet Take 1 tablet (40 mg total) by mouth daily. 08/18/15  Yes Boykin Nearing, MD  metFORMIN (GLUCOPHAGE) 1000 MG tablet Take 1 tablet (1,000 mg total) by mouth 2 (two) times daily with a meal. Patient taking differently: Take 1,000 mg by mouth daily with breakfast.  08/18/15  Yes Josalyn Funches, MD  omeprazole (PRILOSEC) 20 MG capsule Take 1 capsule (20 mg total) by mouth daily. 08/18/15  Yes Josalyn Funches, MD  oxyCODONE-acetaminophen (PERCOCET/ROXICET) 5-325 MG tablet Take 2 tablets by mouth every 6 (six) hours as needed for severe pain. Patient taking differently: Take 1 tablet by mouth daily as needed for moderate pain or severe pain.  12/04/15  Yes Christina P Rama, MD  sulfamethoxazole-trimethoprim (BACTRIM DS,SEPTRA DS) 800-160 MG tablet Take 1 tablet by mouth 2 (two) times daily. 12/12/15  Yes Reyne Dumas, MD  traZODone (DESYREL) 50 MG tablet Take 0.5-1 tablets (25-50 mg  total) by mouth at bedtime as needed for sleep. 11/21/15  Yes Boykin Nearing, MD    Physical Exam: Filed Vitals:   12/16/15 1945 12/16/15 2045 12/16/15 2103 12/16/15 2151  BP: 144/84 154/97  169/94  Pulse: 86 87  83  Temp:   97.8 F (36.6 C) 97.8 F (36.6 C)  TempSrc:   Oral   Resp: 20 18  18   Height:      Weight:      SpO2: 97% 100% 100% 100%      Constitutional: Not in distress. Filed Vitals:   12/16/15 1945 12/16/15 2045 12/16/15 2103 12/16/15 2151  BP: 144/84 154/97  169/94  Pulse: 86 87  83  Temp:   97.8 F (36.6 C) 97.8 F (36.6 C)  TempSrc:  Oral   Resp: 20 18  18   Height:      Weight:      SpO2: 97% 100% 100% 100%   Eyes: Anicteric no pallor. ENMT: No discharge from the ears eyes nose or mouth. Neck: No mass felt. No JVD appreciated. Respiratory: No rhonchi or crepitations. Cardiovascular: S1 and S2 heard. Abdomen: Soft nontender bowel sounds present. Musculoskeletal: Left foot looks swollen able to dorsi and plantar flex without difficulty and is mildly discharged on stressing which appears bloody. Skin: Erythema and swelling of the left foot. Neurologic: Alert awake oriented to time place and person, moves all extremities 5 final facial asymmetry. Tongue is midline. Psychiatric: Appears normal.   Labs on Admission: I have personally reviewed following labs and imaging studies  CBC:  Recent Labs Lab 12/10/15 2039 12/10/15 2053 12/12/15 0517 12/16/15 1515  WBC 8.1  --  8.3 7.4  NEUTROABS 6.1  --   --  5.0  HGB 10.1* 11.6* 9.5* 10.7*  HCT 31.9* 34.0* 29.7* 33.9*  MCV 91.4  --  91.4 95.5  PLT 429*  --  396 [id number]*   Basic Metabolic Panel:  Recent Labs Lab 12/10/15 2039 12/10/15 2053 12/12/15 0517 12/16/15 1515  NA 137 142 137 142  K 4.3 4.5 4.6 4.5  CL 106 105 103 110  CO2 23  --  25 24  GLUCOSE 100* 98 146* 90  BUN 20 22* 20 21*  CREATININE 1.32* 1.30* 1.41* 1.34*  CALCIUM 9.0  --  9.0 9.6   GFR: Estimated Creatinine Clearance: 66.6  mL/min (by C-G formula based on Cr of 1.34). Liver Function Tests:  Recent Labs Lab 12/10/15 2039 12/12/15 0517 12/16/15 1515  AST 17 16 20   ALT 16* 16* 24  ALKPHOS 49 48 48  BILITOT 0.4 0.3 0.4  PROT 6.9 6.5 7.2  ALBUMIN 3.3* 3.2* 3.7   No results for input(s): LIPASE, AMYLASE in the last 168 hours. No results for input(s): AMMONIA in the last 168 hours. Coagulation Profile:  Recent Labs Lab 12/10/15 2039  INR 1.13   Cardiac Enzymes: No results for input(s): CKTOTAL, CKMB, CKMBINDEX, TROPONINI in the last 168 hours. BNP (last 3 results) No results for input(s): PROBNP in the last 8760 hours. HbA1C: No results for input(s): HGBA1C in the last 72 hours. CBG:  Recent Labs Lab 12/10/15 2349 12/11/15 0620 12/11/15 1140 12/11/15 1650 12/11/15 2204  GLUCAP 117* 140* 183* 124* 90   Lipid Profile: No results for input(s): CHOL, HDL, LDLCALC, TRIG, CHOLHDL, LDLDIRECT in the last 72 hours. Thyroid Function Tests: No results for input(s): TSH, T4TOTAL, FREET4, T3FREE, THYROIDAB in the last 72 hours. Anemia Panel: No results for input(s): VITAMINB12, FOLATE, FERRITIN, TIBC, IRON, RETICCTPCT in the last 72 hours. Urine analysis:    Component Value Date/Time   COLORURINE YELLOW 04/24/2015 1056   APPEARANCEUR CLEAR 04/24/2015 1056   LABSPEC 1.013 04/24/2015 1056   PHURINE 5.0 04/24/2015 1056   GLUCOSEU NEGATIVE 04/24/2015 1056   HGBUR MODERATE* 04/24/2015 1056   BILIRUBINUR NEGATIVE 04/24/2015 1056   KETONESUR NEGATIVE 04/24/2015 1056   PROTEINUR NEGATIVE 04/24/2015 1056   UROBILINOGEN 0.2 04/24/2015 1056   NITRITE NEGATIVE 04/24/2015 1056   LEUKOCYTESUR NEGATIVE 04/24/2015 1056   Sepsis Labs: @LABRCNTIP (procalcitonin:4,lacticidven:4) )No results found for this or any previous visit (from the past 240 hour(s)).   Radiological Exams on Admission: Dg Chest 2 View  12/16/2015  CLINICAL DATA:  Toe amputation recently.  Possible sepsis. EXAM: CHEST  2 VIEW COMPARISON:  12/11/2015 FINDINGS: Lungs are adequately inflated without focal consolidation, effusion or pneumothorax. Cardiomediastinal silhouette and remainder of the exam is unchanged. IMPRESSION: No acute disease. Electronically Signed   By: Marin Olp M.D.   On: 12/16/2015 18:15   Dg Foot Complete Left  12/16/2015  CLINICAL DATA:  Left foot numbness.  Rule out osteomyelitis EXAM: LEFT FOOT - COMPLETE 3+ VIEW COMPARISON:  12/14/2015 FINDINGS: Amputation of the great toe at the level of the MTP joint. Amputation of the second toe at the level of the distal aspect of the proximal phalanx. Negative for osteomyelitis. No fracture or arthropathy. No change from the recent study. Arterial calcification. Wound on the dorsum of the foot. IMPRESSION: No interval change.  Negative for osteomyelitis. Electronically Signed   By: Franchot Gallo M.D.   On: 12/16/2015 19:11     Assessment/Plan Principal Problem:   SIRS (systemic inflammatory response syndrome) (HCC) Active Problems:   Essential hypertension   Chronic anemia   Wound infection ([place])   Type 2 diabetes mellitus with vascular disease ([place])    1. SIRS most likely source could be left foot infection - foot looks swollen with some bloody discharge. I have ordered MRI to check where there is any deep abscess. For now we will place patient on empiric antibiotics. 2. Left foot numbness - patient is able to plantar and dorsiflex the foot. Patient's symptoms have been ongoing for 3 days. Patient's foot does look swollen and did discuss with on-call neurologist Dr. Wallie Char who at this time feels patient's symptoms are probably from his foot infection with some compressive neuropathy. 3. Diabetes mellitus type 2 - last hemoglobin A1c checked in 3 days ago was 8.3. Continue Glucotrol and sliding scale coverage. 4. Hypertension - continue Coreg amlodipine and lisinopril. 5. History of TIA - presently holding of aspirin due to some bloody discharge. Patient  agreeable. 6. Chronic anemia - follow CBC closely. 7. Hyperlipidemia on statins. 8. Peripheral neuropathy on Neurontin.   DVT prophylaxis: SCDs. Code Status: Full code.  Family Communication: Patient's wife at the bedside.  Disposition Plan: Home.  Consults called: None. Did discuss with neurologist.  Admission status: Inpatient. Likely stay 2-3 days.    Rise Patience MD [facility] Pager [phone].  If 7PM-7AM, please contact night-coverage [email or web address] Password [email or web address]  12/16/2015, 10:13 PM

## 2015-12-17 ENCOUNTER — Inpatient Hospital Stay (HOSPITAL_COMMUNITY): Payer: Medicaid Other

## 2015-12-17 DIAGNOSIS — T148 Other injury of unspecified body region: Secondary | ICD-10-CM

## 2015-12-17 DIAGNOSIS — T8130XA Disruption of wound, unspecified, initial encounter: Secondary | ICD-10-CM | POA: Diagnosis present

## 2015-12-17 DIAGNOSIS — T8131XA Disruption of external operation (surgical) wound, not elsewhere classified, initial encounter: Secondary | ICD-10-CM

## 2015-12-17 DIAGNOSIS — L089 Local infection of the skin and subcutaneous tissue, unspecified: Secondary | ICD-10-CM

## 2015-12-17 DIAGNOSIS — L03119 Cellulitis of unspecified part of limb: Secondary | ICD-10-CM | POA: Diagnosis present

## 2015-12-17 DIAGNOSIS — L03116 Cellulitis of left lower limb: Secondary | ICD-10-CM

## 2015-12-17 LAB — BASIC METABOLIC PANEL
Anion gap: 6 (ref 5–15)
BUN: 15 mg/dL (ref 6–20)
CHLORIDE: 108 mmol/L (ref 101–111)
CO2: 26 mmol/L (ref 22–32)
CREATININE: 0.99 mg/dL (ref 0.61–1.24)
Calcium: 9.2 mg/dL (ref 8.9–10.3)
Glucose, Bld: 199 mg/dL — ABNORMAL HIGH (ref 65–99)
POTASSIUM: 4.7 mmol/L (ref 3.5–5.1)
SODIUM: 140 mmol/L (ref 135–145)

## 2015-12-17 LAB — SEDIMENTATION RATE: SED RATE: 37 mm/h — AB (ref 0–16)

## 2015-12-17 LAB — CBC
HCT: 32.5 % — ABNORMAL LOW (ref 39.0–52.0)
Hemoglobin: 10.3 g/dL — ABNORMAL LOW (ref 13.0–17.0)
MCH: 30.2 pg (ref 26.0–34.0)
MCHC: 31.7 g/dL (ref 30.0–36.0)
MCV: 95.3 fL (ref 78.0–100.0)
PLATELETS: 411 10*3/uL — AB (ref 150–400)
RBC: 3.41 MIL/uL — AB (ref 4.22–5.81)
RDW: 13.6 % (ref 11.5–15.5)
WBC: 9.5 10*3/uL (ref 4.0–10.5)

## 2015-12-17 LAB — GLUCOSE, CAPILLARY
GLUCOSE-CAPILLARY: 167 mg/dL — AB (ref 65–99)
GLUCOSE-CAPILLARY: 185 mg/dL — AB (ref 65–99)
GLUCOSE-CAPILLARY: 125 mg/dL — AB (ref 65–99)
Glucose-Capillary: 203 mg/dL — ABNORMAL HIGH (ref 65–99)

## 2015-12-17 LAB — PROCALCITONIN: Procalcitonin: <0.1 ng/mL

## 2015-12-17 MED ORDER — FLUOXETINE HCL 10 MG PO CAPS
10.0000 mg | ORAL_CAPSULE | Freq: Every day | ORAL | Status: DC
  Administered 2015-12-17 – 2015-12-23 (×7): 10 mg via ORAL
  Filled 2015-12-17 (×7): qty 1

## 2015-12-17 MED ORDER — AMLODIPINE BESYLATE 5 MG PO TABS
5.0000 mg | ORAL_TABLET | Freq: Every day | ORAL | Status: DC
  Administered 2015-12-17 – 2015-12-23 (×7): 5 mg via ORAL
  Filled 2015-12-17 (×7): qty 1

## 2015-12-17 NOTE — Progress Notes (Signed)
Physical Therapy Wound Treatment Patient Details  Name: Shane [name] MRN: [id number] Date of Birth: 12/17/1970  Today's Date: 12/17/2015 Time: 1010-1105 Time Calculation (min): 55 min  Subjective  Subjective: Pt pleasant and agreeable to hydrotherapy Patient and Family Stated Goals: Heal wound, not have to amputate Date of Onset:  (PTA) Prior Treatments: I&D PTA per pt  Pain Score:  Pt reports no pain throughout session.   Wound Assessment  Wound / Incision (Open or Dehisced) 12/17/15 Incision - Open Foot Left Dorsum (Active)  Dressing Type Compression wrap;Gauze (Comment);Moist to dry 12/17/2015 11:23 AM  Dressing Changed New 12/17/2015 11:23 AM  Dressing Status Clean;Dry;Intact 12/17/2015 11:23 AM  Dressing Change Frequency Daily 12/17/2015 11:23 AM  Site / Wound Assessment Black;Red;Yellow 12/17/2015 11:23 AM  % Wound base Red or Granulating 75% 12/17/2015 11:23 AM  % Wound base Yellow 5% 12/17/2015 11:23 AM  % Wound base Black 20% 12/17/2015 11:23 AM  % Wound base Other (Comment) 0% 12/17/2015 11:23 AM  Peri-wound Assessment Edema;Intact 12/17/2015 11:23 AM  Wound Length (cm) 4.5 cm 12/17/2015 11:23 AM  Wound Width (cm) 3.9 cm 12/17/2015 11:23 AM  Wound Depth (cm) 0.3 cm 12/17/2015 11:23 AM  Undermining (cm) 0.5 at 2 o'clock 12/17/2015 11:23 AM  Margins Unattached edges (unapproximated) 12/17/2015 11:23 AM  Closure None 12/17/2015 11:23 AM  Drainage Amount Scant 12/17/2015 11:23 AM  Drainage Description Serous 12/17/2015 11:23 AM  Treatment Debridement (Selective);Hydrotherapy (Pulse lavage);Packing (Saline gauze) 12/17/2015 11:23 AM   Hydrotherapy Pulsed lavage therapy - wound location: L dorsum of foot Pulsed Lavage with Suction (psi): 12 psi Pulsed Lavage with Suction - Normal Saline Used: 1000 mL Pulsed Lavage Tip: Tip with splash shield Selective Debridement Selective Debridement - Location: L dorsum of foot Selective Debridement - Tools Used: Forceps;Scissors Selective Debridement - Tissue Removed:  Black and yellow necrotic tissue   Wound Assessment and Plan  Wound Therapy - Assess/Plan/Recommendations Wound Therapy - Clinical Statement: Pt presents to hydrotherapy with an open wound on the dorsum of the L foot and numbness through L foot and ankle. Per RN, to start 7/2 if able. Pt will benefit from continued hydrotherapy for selective debridement of necrotic tissue and to decrease bioburden of wound bed.  Wound Therapy - Functional Problem List: Decreased tolerance for OOB due to numbness in L foot.  Factors Delaying/Impairing Wound Healing: Altered sensation;Infection - systemic/local;Multiple medical problems Hydrotherapy Plan: Debridement;Dressing change;Patient/family education;Pulsatile lavage with suction Wound Therapy - Frequency: 6X / week Wound Therapy - Follow Up Recommendations: Home health RN Wound Plan: See above  Wound Therapy Goals- Improve the function of patient's integumentary system by progressing the wound(s) through the phases of wound healing (inflammation - proliferation - remodeling) by: Decrease Necrotic Tissue to: 0% Decrease Necrotic Tissue - Progress: Goal set today Increase Granulation Tissue to: 100% Increase Granulation Tissue - Progress: Goal set today Goals/treatment plan/discharge plan were made with and agreed upon by patient/family: Yes Time For Goal Achievement: 7 days Wound Therapy - Potential for Goals: Excellent  Goals will be updated until maximal potential achieved or discharge criteria met.  Discharge criteria: when goals achieved, discharge from hospital, MD decision/surgical intervention, no progress towards goals, refusal/missing three consecutive treatments without notification or medical reason.  GP     Rolinda Roan 12/17/2015, 11:44 AM  Rolinda Roan, PT, DPT Acute Rehabilitation Services Pager: [phone]

## 2015-12-17 NOTE — Consult Note (Signed)
ORTHOPAEDIC CONSULTATION  REQUESTING PHYSICIAN: Eugenie Filler, MD  Chief Complaint: Left foot wound  HPI: Shane [name] is a 45 y.o. male who presents with left foot wound for the last 2 days.  Was seen earlier this week by our office and sutures were removed from the dorsal foot wound.  He states the swelling is worse and the wound has started to gape open.  Endorses subjective fevers and chills.  Denies any pus.  Ortho consulted.  Past Medical History  Diagnosis Date  . Hypertension     a. 08/2014 Admitted with hypertensive urgency.  . Chest pain     a. 2015 Reportedly normal stress test in [place].  Marland Kitchen TIA (transient ischemic attack) 08/2014; 03/2015    a. 08/2014 in setting of hypertensive urgency.  . Anemia   . Refusal of blood transfusions as patient is Jehovah's Witness   . Hyperlipidemia   . Chronic diastolic CHF (congestive heart failure) (HCC)     a.03/2015 Echo: EF 55-60%, Gr 1 DD, mild MR, triv PR.  . Type II diabetes mellitus ([place])   . Sleep apnea   . GERD (gastroesophageal reflux disease)   . Anxiety   . Depression   . Bipolar disorder [facility])    Past Surgical History  Procedure Laterality Date  . Inguinal hernia repair Bilateral ~ 1983- ~ 1986  . Tonsillectomy  ~ 1985  . Circumcision    . Amputation Left 08/03/2015    Procedure: AMPUTATION LEFT GREAT TOE;  Surgeon: Newt Minion, MD;  Location: [place];  Service: Orthopedics;  Laterality: Left;  . I&d extremity Left 12/02/2015    Procedure: IRRIGATION AND DEBRIDEMENT OF FOOT; LEFT SECOND TOE AMPUTATION;  Surgeon: Meredith Pel, MD;  Location: [place];  Service: Orthopedics;  Laterality: Left;  . Amputation Left 12/02/2015    Procedure: amputation of left 2nd digit   Social History   Social History  . Marital Status: Married    Spouse Name: N/A  . Number of Children: N/A  . Years of Education: N/A   Social History Main Topics  . Smoking status: Never Smoker   . Smokeless tobacco: Never Used  . Alcohol Use:  No  . Drug Use: No  . Sexual Activity: No   Other Topics Concern  . None   Social History Narrative   Lives in [place] with wife.  Active but doesn't routinely exercise.   Family History  Problem Relation Age of Onset  . Hypertension Mother   . Diabetes Mother   . Hyperlipidemia Mother   . Heart disease Mother     s/p pacemaker  . Diabetes Father   . Hypertension Father   . Stroke Father   . Heart attack Father     first MI @ 51.  . Stroke Brother    - negative except otherwise stated in the family history section Allergies  Allergen Reactions  . Eggs Or Egg-Derived Products Diarrhea  . Other Other (See Comments)    Red meat causes stomach pains, bloating and diarrhea  . Milk-Related Compounds Diarrhea and Other (See Comments)    Any dairy products  - diarrhea and bloating   Prior to Admission medications   Medication Sig Start Date End Date Taking? Authorizing Provider  amLODipine (NORVASC) 5 MG tablet Take 1 tablet (5 mg total) by mouth daily. 09/04/15  Yes Maren Reamer, MD  amoxicillin-clavulanate (AUGMENTIN) 875-125 MG tablet Take 1 tablet by mouth 2 (two) times daily. 12/12/15  Yes  Reyne Dumas, MD  aspirin EC 325 MG tablet Take 1 tablet (325 mg total) by mouth daily. Patient taking differently: Take 325 mg by mouth every morning.  09/04/15  Yes Maren Reamer, MD  atorvastatin (LIPITOR) 80 MG tablet Take 1 tablet (80 mg total) by mouth every morning. 08/18/15  Yes Josalyn Funches, MD  carvedilol (COREG) 12.5 MG tablet TAKE 1 TABLET BY MOUTH 2 TIMES DAILY WITH A MEAL Patient taking differently: TAKE 1 TABLET BY MOUTH IN THE MORNING. 05/17/15  Yes Josalyn Funches, MD  divalproex (DEPAKOTE) 500 MG DR tablet Take 2 tablets (1,000 mg total) by mouth at bedtime. 12/04/15  Yes Venetia Maxon Rama, MD  ferrous sulfate 325 (65 FE) MG tablet Take 1 tablet (325 mg total) by mouth 2 (two) times daily with a meal. Patient taking differently: Take 325 mg by mouth daily with breakfast.   08/18/15  Yes Josalyn Funches, MD  FLUoxetine (PROZAC) 10 MG tablet Take 10 mg by mouth daily.   Yes Historical Provider, MD  gabapentin (NEURONTIN) 100 MG capsule Take 1 capsule (100 mg total) by mouth 3 (three) times daily. Patient taking differently: Take 100 mg by mouth daily.  09/04/15  Yes Dawn Lazarus Gowda, MD  glipiZIDE (GLUCOTROL) 10 MG tablet Take 1 tablet (10 mg total) by mouth 2 (two) times daily before a meal. Patient taking differently: Take 10 mg by mouth daily before breakfast.  08/18/15  Yes Josalyn Funches, MD  lisinopril (PRINIVIL,ZESTRIL) 40 MG tablet Take 1 tablet (40 mg total) by mouth daily. 08/18/15  Yes Boykin Nearing, MD  metFORMIN (GLUCOPHAGE) 1000 MG tablet Take 1 tablet (1,000 mg total) by mouth 2 (two) times daily with a meal. Patient taking differently: Take 1,000 mg by mouth daily with breakfast.  08/18/15  Yes Josalyn Funches, MD  omeprazole (PRILOSEC) 20 MG capsule Take 1 capsule (20 mg total) by mouth daily. 08/18/15  Yes Josalyn Funches, MD  oxyCODONE-acetaminophen (PERCOCET/ROXICET) 5-325 MG tablet Take 2 tablets by mouth every 6 (six) hours as needed for severe pain. Patient taking differently: Take 1 tablet by mouth daily as needed for moderate pain or severe pain.  12/04/15  Yes Christina P Rama, MD  sulfamethoxazole-trimethoprim (BACTRIM DS,SEPTRA DS) 800-160 MG tablet Take 1 tablet by mouth 2 (two) times daily. 12/12/15  Yes Reyne Dumas, MD  traZODone (DESYREL) 50 MG tablet Take 0.5-1 tablets (25-50 mg total) by mouth at bedtime as needed for sleep. 11/21/15  Yes Boykin Nearing, MD   Dg Chest 2 View  12/16/2015  CLINICAL DATA:  Toe amputation recently.  Possible sepsis. EXAM: CHEST  2 VIEW COMPARISON:  12/11/2015 FINDINGS: Lungs are adequately inflated without focal consolidation, effusion or pneumothorax. Cardiomediastinal silhouette and remainder of the exam is unchanged. IMPRESSION: No acute disease. Electronically Signed   By: Marin Olp M.D.   On: 12/16/2015 18:15     Dg Foot Complete Left  12/16/2015  CLINICAL DATA:  Left foot numbness.  Rule out osteomyelitis EXAM: LEFT FOOT - COMPLETE 3+ VIEW COMPARISON:  12/14/2015 FINDINGS: Amputation of the great toe at the level of the MTP joint. Amputation of the second toe at the level of the distal aspect of the proximal phalanx. Negative for osteomyelitis. No fracture or arthropathy. No change from the recent study. Arterial calcification. Wound on the dorsum of the foot. IMPRESSION: No interval change.  Negative for osteomyelitis. Electronically Signed   By: Franchot Gallo M.D.   On: 12/16/2015 19:11   - pertinent xrays, CT, MRI  studies were reviewed and independently interpreted  Positive ROS: All other systems have been reviewed and were otherwise negative with the exception of those mentioned in the HPI and as above.  Physical Exam: General: Alert, no acute distress Cardiovascular: No pedal edema Respiratory: No cyanosis, no use of accessory musculature GI: No organomegaly, abdomen is soft and non-tender Skin: No lesions in the area of chief complaint Neurologic: Sensation intact distally Psychiatric: Patient is competent for consent with normal mood and affect Lymphatic: No axillary or cervical lymphadenopathy  MUSCULOSKELETAL:  - 3-4 cm open wound on dorsum of foot with good bed of tissue, scant serous drainage, no frank pus - generalized edema of foot - foot is wwp, strong pulses - sensation decreased  Assessment: Left foot wound dehiscence  Plan: - agree with empiric abx  - will treat with local wound care for now, wet to dry and hydrotherapy, and allow for healing by secondary intention - will have Dr. Marlou Sa reevaluate in the morning  Thank you for the consult and the opportunity to see Shane [name] Eduard Roux, MD [place] 9:24 AM

## 2015-12-17 NOTE — Progress Notes (Signed)
PROGRESS NOTE    Shane [name]  [id number] DOB: 05/09/1971 DOA: 12/16/2015 PCP: Minerva Ends, MD    Brief Narrative:   Ewell Defusco is a 45 y.o. male with diabetes mellitus type 2, hypertension, hyperlipidemia who was discharged on June 19 after being treated for left foot wound infection requiring second toe amputation and wound debridement and was again admitted and discharged 3 days ago for TIA presents to the ER for the second time with complaints of increasing swelling bloody discharge and numbness of the left foot for the last 3 days. Denies any weakness of upper or lower extremities. There is no foot drop. On exam there is mild discharge on the dressing on the left foot. Left foot looks swollen. Patient states he has been also having subjective feeling of fever chills. Patient has been admitted for further management. Patient states he did follow-up with orthopedic surgeon Dr. Randel Pigg office on Friday and had dressings changed.  ED Course: Patient has been empirically started on antibiotics. On arrival patient was hypotensive tachycardic which improved with IV fluid boluses for sepsis.  Assessment & Plan:   Principal Problem:   SIRS (systemic inflammatory response syndrome) (HCC) Active Problems:   Wound dehiscence   Essential hypertension   Chronic anemia   Wound infection ([place])   Type 2 diabetes mellitus with vascular disease (HCC)   Cellulitis of left foot  #1 systemic inflammatory response syndrome Likely secondary to left foot infection. Left foot swollen. MRI of the left foot with marked improvement in subcutaneous abscess on the dorsum of the foot. Residual abscess deep to the incision. Blood cultures pending. Continue empiric Antibiotics. Follow.  #2 left foot wound infection/wound dehiscence MRI left foot with significant improvement in subcutaneous abscess on the dorsum of the foot. Residual abscess deep to the incision. Blood cultures pending. Patient has been  seen in consultation by orthopedics who agree on continue empiric IV antibiotics and local wound care with wet-to-dry dressing changes and hydrotherapy. Per orthopedics.  #3 hypertension Continue Coreg, Norvasc, lisinopril.  #4 diabetes mellitus type 2 Hemoglobin A1c is 8.3. CBGs have ranged from 125-185. Hold oral hypoglycemic agents. Sliding scale insulin.  #5 history of TIA Aspirin on hold secondary to bloody discharge noted on left foot.  #6 chronic anemia Follow H&H.  #7 peripheral neuropathy Neurontin.  #8 hyperlipidemia Continue statin.   DVT prophylaxis: SCDs Code Status: Full Family Communication: Updated patient. No family at bedside. Disposition Plan: Pending orthopedic evaluation.   Consultants:   Orthopedics: Dr Erlinda Hong 12/17/2015  Procedures:   MRI left foot 12/17/2015  Antimicrobials:   IV Zosyn 12/16/2015  IV vancomycin 12/16/2015   Subjective: Patient c/o numbness in RLE. No CP. No SOB.  Objective: Filed Vitals:   12/16/15 2103 12/16/15 2151 12/17/15 0616 12/17/15 1237  BP:  169/94 149/94 167/96  Pulse:  83 91 83  Temp: 97.8 F (36.6 C) 97.8 F (36.6 C) 97.4 F (36.3 C) 97.6 F (36.4 C)  TempSrc: Oral   Oral  Resp:  18 18 18   Height:      Weight:      SpO2: 100% 100% 99% 100%    Intake/Output Summary (Last 24 hours) at 12/17/15 1854 Last data filed at 12/17/15 1821  Gross per 24 hour  Intake 3481.67 ml  Output   4050 ml  Net -568.33 ml   Filed Weights   12/16/15 1502 12/16/15 1559  Weight: 72.576 kg (160 lb) 76.204 kg (168 lb)    Examination:  General  exam: Appears calm and comfortable  Respiratory system: Clear to auscultation. Respiratory effort normal. Cardiovascular system: S1 & S2 heard, RRR. No JVD, murmurs, rubs, gallops or clicks. No pedal edema. Gastrointestinal system: Abdomen is nondistended, soft and nontender. No organomegaly or masses felt. Normal bowel sounds heard. Central nervous system: Alert and oriented. No  focal neurological deficits. Extremities: LLE in bandage. Skin: No rashes, lesions or ulcers Psychiatry: Judgement and insight appear normal. Mood & affect appropriate.     Data Reviewed: I have personally reviewed following labs and imaging studies  CBC:  Recent Labs Lab 12/10/15 2039 12/10/15 2053 12/12/15 0517 12/16/15 1515 12/17/15 0405  WBC 8.1  --  8.3 7.4 9.5  NEUTROABS 6.1  --   --  5.0  --   HGB 10.1* 11.6* 9.5* 10.7* 10.3*  HCT 31.9* 34.0* 29.7* 33.9* 32.5*  MCV 91.4  --  91.4 95.5 95.3  PLT 429*  --  396 442* 123456*   Basic Metabolic Panel:  Recent Labs Lab 12/10/15 2039 12/10/15 2053 12/12/15 0517 12/16/15 1515 12/17/15 0405  NA 137 142 137 142 140  K 4.3 4.5 4.6 4.5 4.7  CL 106 105 103 110 108  CO2 23  --  25 24 26   GLUCOSE 100* 98 146* 90 199*  BUN 20 22* 20 21* 15  CREATININE 1.32* 1.30* 1.41* 1.34* 0.99  CALCIUM 9.0  --  9.0 9.6 9.2   GFR: Estimated Creatinine Clearance: 90.2 mL/min (by C-G formula based on Cr of 0.99). Liver Function Tests:  Recent Labs Lab 12/10/15 2039 12/12/15 0517 12/16/15 1515  AST 17 16 20   ALT 16* 16* 24  ALKPHOS 49 48 48  BILITOT 0.4 0.3 0.4  PROT 6.9 6.5 7.2  ALBUMIN 3.3* 3.2* 3.7   No results for input(s): LIPASE, AMYLASE in the last 168 hours. No results for input(s): AMMONIA in the last 168 hours. Coagulation Profile:  Recent Labs Lab 12/10/15 2039  INR 1.13   Cardiac Enzymes: No results for input(s): CKTOTAL, CKMB, CKMBINDEX, TROPONINI in the last 168 hours. BNP (last 3 results) No results for input(s): PROBNP in the last 8760 hours. HbA1C: No results for input(s): HGBA1C in the last 72 hours. CBG:  Recent Labs Lab 12/11/15 2204 12/16/15 2310 12/17/15 0756 12/17/15 1240 12/17/15 1743  GLUCAP 90 121* 167* 185* 125*   Lipid Profile: No results for input(s): CHOL, HDL, LDLCALC, TRIG, CHOLHDL, LDLDIRECT in the last 72 hours. Thyroid Function Tests: No results for input(s): TSH, T4TOTAL,  FREET4, T3FREE, THYROIDAB in the last 72 hours. Anemia Panel: No results for input(s): VITAMINB12, FOLATE, FERRITIN, TIBC, IRON, RETICCTPCT in the last 72 hours. Sepsis Labs:  Recent Labs Lab 12/16/15 1537 12/16/15 1831 12/16/15 2234  PROCALCITON  --   --  <0.10  LATICACIDVEN 3.64* 1.87  --     Recent Results (from the past 240 hour(s))  Blood Culture (routine x 2)     Status: None (Preliminary result)   Collection Time: 12/16/15  3:50 PM  Result Value Ref Range Status   Specimen Description BLOOD RIGHT ANTECUBITAL  Final   Special Requests BOTTLES DRAWN AEROBIC AND ANAEROBIC 5CC  Final   Culture NO GROWTH < 24 HOURS  Final   Report Status PENDING  Incomplete  Blood Culture (routine x 2)     Status: None (Preliminary result)   Collection Time: 12/16/15  3:55 PM  Result Value Ref Range Status   Specimen Description BLOOD LEFT ANTECUBITAL  Final   Special Requests BOTTLES  DRAWN AEROBIC ONLY 10CC  Final   Culture NO GROWTH < 24 HOURS  Final   Report Status PENDING  Incomplete         Radiology Studies: Dg Chest 2 View  12/16/2015  CLINICAL DATA:  Toe amputation recently.  Possible sepsis. EXAM: CHEST  2 VIEW COMPARISON:  12/11/2015 FINDINGS: Lungs are adequately inflated without focal consolidation, effusion or pneumothorax. Cardiomediastinal silhouette and remainder of the exam is unchanged. IMPRESSION: No acute disease. Electronically Signed   By: Marin Olp M.D.   On: 12/16/2015 18:15   Mr Foot Left Wo Contrast  12/17/2015  CLINICAL DATA:  Cellulitis of the left foot. Drainage of an abscess on December 02, 2015. EXAM: MRI OF THE LEFT FOOT WITHOUT CONTRAST TECHNIQUE: Multiplanar, multisequence MR imaging was performed. No intravenous contrast was administered. COMPARISON:  Radiographs dated 12/16/2015 and MRI dated 12/01/2015 FINDINGS: There has been marked reduction in the size of the subcutaneous abscess on the dorsum of the left foot. The abscess now measures approximately 4.3  cm in width and 3.9 cm in length and approximately 6 mm in depth. This communicates with the soft tissue wound on the dorsum of the foot which is centered over the abscess. Marked reduction in the subcutaneous edema on the dorsum of the foot. There is no osteomyelitis or joint effusions.  No myositis. IMPRESSION: Marked improvement in the subcutaneous abscess on the dorsum of the foot. Residual abscess deep to the incision as described. Electronically Signed   By: Lorriane Shire M.D.   On: 12/17/2015 12:16   Dg Foot Complete Left  12/16/2015  CLINICAL DATA:  Left foot numbness.  Rule out osteomyelitis EXAM: LEFT FOOT - COMPLETE 3+ VIEW COMPARISON:  12/14/2015 FINDINGS: Amputation of the great toe at the level of the MTP joint. Amputation of the second toe at the level of the distal aspect of the proximal phalanx. Negative for osteomyelitis. No fracture or arthropathy. No change from the recent study. Arterial calcification. Wound on the dorsum of the foot. IMPRESSION: No interval change.  Negative for osteomyelitis. Electronically Signed   By: Franchot Gallo M.D.   On: 12/16/2015 19:11        Scheduled Meds: . amLODipine  5 mg Oral Daily  . atorvastatin  80 mg Oral q1800  . divalproex  1,000 mg Oral QHS  . ferrous sulfate  325 mg Oral Q breakfast  . FLUoxetine  10 mg Oral Daily  . gabapentin  100 mg Oral Daily  . glipiZIDE  10 mg Oral QAC breakfast  . insulin aspart  0-9 Units Subcutaneous TID WC  . pantoprazole  40 mg Oral Daily  . piperacillin-tazobactam (ZOSYN)  IV  3.375 g Intravenous Q8H  . sodium chloride  250 mL Intravenous Once  . vancomycin  1,000 mg Intravenous Q12H   Continuous Infusions: . sodium chloride 100 mL/hr at 12/17/15 1807     LOS: 1 day    Time spent: 88 mins    Latrise Bowland, MD [facility] Pager [phone]  If 7PM-7AM, please contact night-coverage [email or web address] Password [facility] 12/17/2015, 6:54 PM

## 2015-12-18 DIAGNOSIS — T8131XD Disruption of external operation (surgical) wound, not elsewhere classified, subsequent encounter: Secondary | ICD-10-CM

## 2015-12-18 DIAGNOSIS — D649 Anemia, unspecified: Secondary | ICD-10-CM

## 2015-12-18 LAB — BASIC METABOLIC PANEL
Anion gap: 18 — ABNORMAL HIGH (ref 5–15)
BUN: 12 mg/dL (ref 6–20)
CALCIUM: 10.1 mg/dL (ref 8.9–10.3)
CO2: 27 mmol/L (ref 22–32)
CREATININE: 1.02 mg/dL (ref 0.61–1.24)
Chloride: 100 mmol/L — ABNORMAL LOW (ref 101–111)
GFR calc Af Amer: >60 mL/min (ref >60)
Glucose, Bld: 156 mg/dL — ABNORMAL HIGH (ref 65–99)
Potassium: 3.9 mmol/L (ref 3.5–5.1)
SODIUM: 145 mmol/L (ref 135–145)

## 2015-12-18 LAB — CBC
HCT: 30.3 % — ABNORMAL LOW (ref 39.0–52.0)
Hemoglobin: 9.8 g/dL — ABNORMAL LOW (ref 13.0–17.0)
MCH: 30.2 pg (ref 26.0–34.0)
MCHC: 32.3 g/dL (ref 30.0–36.0)
MCV: 93.5 fL (ref 78.0–100.0)
PLATELETS: 355 10*3/uL (ref 150–400)
RBC: 3.24 MIL/uL — ABNORMAL LOW (ref 4.22–5.81)
RDW: 13.4 % (ref 11.5–15.5)
WBC: 7.6 10*3/uL (ref 4.0–10.5)

## 2015-12-18 LAB — PROCALCITONIN: Procalcitonin: <0.1 ng/mL

## 2015-12-18 LAB — GLUCOSE, CAPILLARY
GLUCOSE-CAPILLARY: 170 mg/dL — AB (ref 65–99)
GLUCOSE-CAPILLARY: 212 mg/dL — AB (ref 65–99)
GLUCOSE-CAPILLARY: 193 mg/dL — AB (ref 65–99)
Glucose-Capillary: 159 mg/dL — ABNORMAL HIGH (ref 65–99)

## 2015-12-18 NOTE — Progress Notes (Signed)
Left foot possible deep recurrent infection Plan or tomorrow with abx bead placement and wound vac

## 2015-12-18 NOTE — Progress Notes (Signed)
PROGRESS NOTE    Shane [name]  [id number] DOB: 08-May-1971 DOA: 12/16/2015 PCP: Minerva Ends, MD    Brief Narrative:   Shane [name] is a 45 y.o. male with diabetes mellitus type 2, hypertension, hyperlipidemia who was discharged on June 19 after being treated for left foot wound infection requiring second toe amputation and wound debridement and was again admitted and discharged 3 days ago for TIA presents to the ER for the second time with complaints of increasing swelling bloody discharge and numbness of the left foot for the last 3 days. Denies any weakness of upper or lower extremities. There is no foot drop. On exam there is mild discharge on the dressing on the left foot. Left foot looks swollen. Patient states he has been also having subjective feeling of fever chills. Patient has been admitted for further management. Patient states he did follow-up with orthopedic surgeon Dr. Randel Pigg office on Friday and had dressings changed.  ED Course: Patient has been empirically started on antibiotics. On arrival patient was hypotensive tachycardic which improved with IV fluid boluses for sepsis.  Assessment & Plan:   Principal Problem:   SIRS (systemic inflammatory response syndrome) (HCC) Active Problems:   Wound dehiscence   Essential hypertension   Chronic anemia   Wound infection ([place])   Type 2 diabetes mellitus with vascular disease (HCC)   Cellulitis of left foot  #1 systemic inflammatory response syndrome Likely secondary to left foot infection. Left foot swollen. MRI of the left foot with marked improvement in subcutaneous abscess on the dorsum of the foot. Residual abscess deep to the incision. Blood cultures pending. Continue empiric Antibiotics. Follow.  #2 left foot wound infection/wound dehiscence MRI left foot with significant improvement in subcutaneous abscess on the dorsum of the foot. Residual abscess deep to the incision. Blood cultures pending. Patient has been  seen in consultation by orthopedics who agree on continue empiric IV antibiotics and local wound care with wet-to-dry dressing changes and hydrotherapy. Per orthopedics.  #3 hypertension Continue Coreg, Norvasc, lisinopril.  #4 diabetes mellitus type 2 Hemoglobin A1c is 8.3. CBGs have ranged from 125-185. Hold oral hypoglycemic agents. Sliding scale insulin.  #5 history of TIA Aspirin on hold secondary to bloody discharge noted on left foot.  #6 chronic anemia Follow H&H.  #7 peripheral neuropathy Neurontin.  #8 hyperlipidemia Continue statin.   DVT prophylaxis: SCDs Code Status: Full Family Communication: Updated patient and wife at bedside. Disposition Plan: Pending orthopedic evaluation.   Consultants:   Orthopedics: Dr Erlinda Hong 12/17/2015  Procedures:   MRI left foot 12/17/2015  ABIs pending  Antimicrobials:   IV Zosyn 12/16/2015  IV vancomycin 12/16/2015   Subjective: Patient c/o numbness in RLE from his foot to his calf region. No CP. No SOB.  Objective: Filed Vitals:   12/17/15 1237 12/17/15 2129 12/18/15 0522 12/18/15 1431  BP: 167/96 154/90 124/76 116/80  Pulse: 83 79 79 80  Temp: 97.6 F (36.4 C) 98.3 F (36.8 C) 98.3 F (36.8 C) 98.5 F (36.9 C)  TempSrc: Oral Oral Oral Oral  Resp: 18 18 18 16   Height:      Weight:      SpO2: 100% 100% 100% 97%    Intake/Output Summary (Last 24 hours) at 12/18/15 1701 Last data filed at 12/18/15 1526  Gross per 24 hour  Intake 2751.67 ml  Output   2250 ml  Net 501.67 ml   Filed Weights   12/16/15 1502 12/16/15 1559  Weight: 72.576 kg (160 lb)  76.204 kg (168 lb)    Examination:  General exam: Appears calm and comfortable  Respiratory system: Clear to auscultation. Respiratory effort normal. Cardiovascular system: S1 & S2 heard, RRR. No JVD, murmurs, rubs, gallops or clicks. No pedal edema. Gastrointestinal system: Abdomen is nondistended, soft and nontender. No organomegaly or masses felt. Normal bowel  sounds heard. Central nervous system: Alert and oriented. No focal neurological deficits. Extremities: LLE in bandage. Skin: No rashes, lesions or ulcers Psychiatry: Judgement and insight appear normal. Mood & affect appropriate.     Data Reviewed: I have personally reviewed following labs and imaging studies  CBC:  Recent Labs Lab 12/12/15 0517 12/16/15 1515 12/17/15 0405 12/18/15 0454  WBC 8.3 7.4 9.5 7.6  NEUTROABS  --  5.0  --   --   HGB 9.5* 10.7* 10.3* 9.8*  HCT 29.7* 33.9* 32.5* 30.3*  MCV 91.4 95.5 95.3 93.5  PLT 396 442* 411* [id number]   Basic Metabolic Panel:  Recent Labs Lab 12/12/15 0517 12/16/15 1515 12/17/15 0405 12/18/15 0454  NA 137 142 140 145  K 4.6 4.5 4.7 3.9  CL 103 110 108 100*  CO2 25 24 26 27   GLUCOSE 146* 90 199* 156*  BUN 20 21* 15 12  CREATININE 1.41* 1.34* 0.99 1.02  CALCIUM 9.0 9.6 9.2 10.1   GFR: Estimated Creatinine Clearance: 87.5 mL/min (by C-G formula based on Cr of 1.02). Liver Function Tests:  Recent Labs Lab 12/12/15 0517 12/16/15 1515  AST 16 20  ALT 16* 24  ALKPHOS 48 48  BILITOT 0.3 0.4  PROT 6.5 7.2  ALBUMIN 3.2* 3.7   No results for input(s): LIPASE, AMYLASE in the last 168 hours. No results for input(s): AMMONIA in the last 168 hours. Coagulation Profile: No results for input(s): INR, PROTIME in the last 168 hours. Cardiac Enzymes: No results for input(s): CKTOTAL, CKMB, CKMBINDEX, TROPONINI in the last 168 hours. BNP (last 3 results) No results for input(s): PROBNP in the last 8760 hours. HbA1C: No results for input(s): HGBA1C in the last 72 hours. CBG:  Recent Labs Lab 12/17/15 1240 12/17/15 1743 12/17/15 2125 12/18/15 0754 12/18/15 1202  GLUCAP 185* 125* 203* 159* 170*   Lipid Profile: No results for input(s): CHOL, HDL, LDLCALC, TRIG, CHOLHDL, LDLDIRECT in the last 72 hours. Thyroid Function Tests: No results for input(s): TSH, T4TOTAL, FREET4, T3FREE, THYROIDAB in the last 72 hours. Anemia  Panel: No results for input(s): VITAMINB12, FOLATE, FERRITIN, TIBC, IRON, RETICCTPCT in the last 72 hours. Sepsis Labs:  Recent Labs Lab 12/16/15 1537 12/16/15 1831 12/16/15 2234 12/18/15 0454  PROCALCITON  --   --  <0.10 <0.10  LATICACIDVEN 3.64* 1.87  --   --     Recent Results (from the past 240 hour(s))  Blood Culture (routine x 2)     Status: None (Preliminary result)   Collection Time: 12/16/15  3:50 PM  Result Value Ref Range Status   Specimen Description BLOOD RIGHT ANTECUBITAL  Final   Special Requests BOTTLES DRAWN AEROBIC AND ANAEROBIC 5CC  Final   Culture NO GROWTH 2 DAYS  Final   Report Status PENDING  Incomplete  Blood Culture (routine x 2)     Status: None (Preliminary result)   Collection Time: 12/16/15  3:55 PM  Result Value Ref Range Status   Specimen Description BLOOD LEFT ANTECUBITAL  Final   Special Requests BOTTLES DRAWN AEROBIC ONLY 10CC  Final   Culture NO GROWTH 2 DAYS  Final   Report Status PENDING  Incomplete         Radiology Studies: Dg Chest 2 View  12/16/2015  CLINICAL DATA:  Toe amputation recently.  Possible sepsis. EXAM: CHEST  2 VIEW COMPARISON:  12/11/2015 FINDINGS: Lungs are adequately inflated without focal consolidation, effusion or pneumothorax. Cardiomediastinal silhouette and remainder of the exam is unchanged. IMPRESSION: No acute disease. Electronically Signed   By: Marin Olp M.D.   On: 12/16/2015 18:15   Mr Foot Left Wo Contrast  12/17/2015  CLINICAL DATA:  Cellulitis of the left foot. Drainage of an abscess on December 02, 2015. EXAM: MRI OF THE LEFT FOOT WITHOUT CONTRAST TECHNIQUE: Multiplanar, multisequence MR imaging was performed. No intravenous contrast was administered. COMPARISON:  Radiographs dated 12/16/2015 and MRI dated 12/01/2015 FINDINGS: There has been marked reduction in the size of the subcutaneous abscess on the dorsum of the left foot. The abscess now measures approximately 4.3 cm in width and 3.9 cm in length and  approximately 6 mm in depth. This communicates with the soft tissue wound on the dorsum of the foot which is centered over the abscess. Marked reduction in the subcutaneous edema on the dorsum of the foot. There is no osteomyelitis or joint effusions.  No myositis. IMPRESSION: Marked improvement in the subcutaneous abscess on the dorsum of the foot. Residual abscess deep to the incision as described. Electronically Signed   By: Lorriane Shire M.D.   On: 12/17/2015 12:16   Dg Foot Complete Left  12/16/2015  CLINICAL DATA:  Left foot numbness.  Rule out osteomyelitis EXAM: LEFT FOOT - COMPLETE 3+ VIEW COMPARISON:  12/14/2015 FINDINGS: Amputation of the great toe at the level of the MTP joint. Amputation of the second toe at the level of the distal aspect of the proximal phalanx. Negative for osteomyelitis. No fracture or arthropathy. No change from the recent study. Arterial calcification. Wound on the dorsum of the foot. IMPRESSION: No interval change.  Negative for osteomyelitis. Electronically Signed   By: Franchot Gallo M.D.   On: 12/16/2015 19:11        Scheduled Meds: . amLODipine  5 mg Oral Daily  . atorvastatin  80 mg Oral q1800  . divalproex  1,000 mg Oral QHS  . ferrous sulfate  325 mg Oral Q breakfast  . FLUoxetine  10 mg Oral Daily  . gabapentin  100 mg Oral Daily  . insulin aspart  0-9 Units Subcutaneous TID WC  . pantoprazole  40 mg Oral Daily  . piperacillin-tazobactam (ZOSYN)  IV  3.375 g Intravenous Q8H  . sodium chloride  250 mL Intravenous Once  . vancomycin  1,000 mg Intravenous Q12H   Continuous Infusions:     LOS: 2 days    Time spent: 30 mins    Layman Gully, MD [facility] Pager [phone]  If 7PM-7AM, please contact night-coverage [email or web address] Password [email or web address] 12/18/2015, 5:01 PM

## 2015-12-18 NOTE — Progress Notes (Signed)
Physical Therapy Wound Treatment Patient Details  Name: Shane [name] MRN: [id number] Date of Birth: [birth year]  Today's Date: 12/18/2015 Time: 0923-3007 Time Calculation (min): 17 min  Subjective  Subjective: Pt reportind the numbness in RLE is from just below knee all the way down and including foot. Patient and Family Stated Goals: Heal wound, not have to amputate Date of Onset:  (PTA) Prior Treatments: I&D PTA per pt  Pain Score: Pain Score: 0-No pain  Wound Assessment  Wound / Incision (Open or Dehisced) 12/17/15 Incision - Open Foot Left Dorsum (Active)  Dressing Type Compression wrap;Gauze (Comment);Moist to dry 12/18/2015 12:08 PM  Dressing Changed Changed 12/18/2015 12:08 PM  Dressing Status Clean;Dry;Intact 12/18/2015 12:08 PM  Dressing Change Frequency Daily 12/18/2015 12:08 PM  Site / Wound Assessment Black;Red;Yellow 12/18/2015 12:08 PM  % Wound base Red or Granulating 75% 12/18/2015 12:08 PM  % Wound base Yellow 20% 12/18/2015 12:08 PM  % Wound base Black 5% 12/18/2015 12:08 PM  % Wound base Other (Comment) 0% 12/18/2015 12:08 PM  Peri-wound Assessment Edema;Intact 12/18/2015 12:08 PM  Wound Length (cm) 4.5 cm 12/17/2015 11:23 AM  Wound Width (cm) 3.9 cm 12/17/2015 11:23 AM  Wound Depth (cm) 0.3 cm 12/17/2015 11:23 AM  Undermining (cm) 0.5 at 2 o'clock 12/17/2015 11:23 AM  Margins Unattached edges (unapproximated) 12/18/2015 12:08 PM  Closure None 12/18/2015 12:08 PM  Drainage Amount Scant 12/18/2015 12:08 PM  Drainage Description Serous 12/18/2015 12:08 PM  Treatment Debridement (Selective);Hydrotherapy (Pulse lavage);Packing (Saline gauze) 12/18/2015 12:08 PM   Hydrotherapy Pulsed lavage therapy - wound location: L dorsum of foot Pulsed Lavage with Suction (psi): 8 psi Pulsed Lavage with Suction - Normal Saline Used: 1000 mL Pulsed Lavage Tip: Tip with splash shield Selective Debridement Selective Debridement - Location: L dorsum of foot Selective Debridement - Tools Used:  Forceps;Scissors Selective Debridement - Tissue Removed: Black and yellow necrotic tissue   Wound Assessment and Plan  Wound Therapy - Assess/Plan/Recommendations Wound Therapy - Clinical Statement: Wound bed looks a little dry. May benefit from hydrogel to keep wound bed moist. Wound Therapy - Functional Problem List: Decreased tolerance for OOB due to numbness in L foot.  Factors Delaying/Impairing Wound Healing: Altered sensation;Infection - systemic/local;Multiple medical problems Hydrotherapy Plan: Debridement;Dressing change;Patient/family education;Pulsatile lavage with suction Wound Therapy - Frequency: 6X / week Wound Therapy - Follow Up Recommendations: Home health RN Wound Plan: See above  Wound Therapy Goals- Improve the function of patient's integumentary system by progressing the wound(s) through the phases of wound healing (inflammation - proliferation - remodeling) by: Decrease Necrotic Tissue to: 0% Decrease Necrotic Tissue - Progress: Progressing toward goal Increase Granulation Tissue to: 100% Increase Granulation Tissue - Progress: Progressing toward goal  Goals will be updated until maximal potential achieved or discharge criteria met.  Discharge criteria: when goals achieved, discharge from hospital, MD decision/surgical intervention, no progress towards goals, refusal/missing three consecutive treatments without notification or medical reason.  [name] 12/18/2015, 12:15 PM [place]

## 2015-12-19 ENCOUNTER — Inpatient Hospital Stay (HOSPITAL_COMMUNITY): Payer: Medicaid Other | Admitting: Anesthesiology

## 2015-12-19 ENCOUNTER — Encounter (HOSPITAL_COMMUNITY)
Admission: EM | Disposition: A | Discharge status: Home Health | Payer: Self-pay | Source: Home / Self Care | Attending: Internal Medicine

## 2015-12-19 ENCOUNTER — Encounter (HOSPITAL_COMMUNITY): Payer: Self-pay | Admitting: Anesthesiology

## 2015-12-19 DIAGNOSIS — R200 Anesthesia of skin: Secondary | ICD-10-CM

## 2015-12-19 HISTORY — PX: APPLICATION OF WOUND VAC: SHX5189

## 2015-12-19 HISTORY — PX: I & D EXTREMITY: SHX5045

## 2015-12-19 LAB — GLUCOSE, CAPILLARY
GLUCOSE-CAPILLARY: 177 mg/dL — AB (ref 65–99)
GLUCOSE-CAPILLARY: 206 mg/dL — AB (ref 65–99)
Glucose-Capillary: 158 mg/dL — ABNORMAL HIGH (ref 65–99)
Glucose-Capillary: 160 mg/dL — ABNORMAL HIGH (ref 65–99)
Glucose-Capillary: 253 mg/dL — ABNORMAL HIGH (ref 65–99)

## 2015-12-19 LAB — CBC
HCT: 32.2 % — ABNORMAL LOW (ref 39.0–52.0)
Hemoglobin: 10.3 g/dL — ABNORMAL LOW (ref 13.0–17.0)
MCH: 30 pg (ref 26.0–34.0)
MCHC: 32 g/dL (ref 30.0–36.0)
MCV: 93.9 fL (ref 78.0–100.0)
PLATELETS: 384 10*3/uL (ref 150–400)
RBC: 3.43 MIL/uL — AB (ref 4.22–5.81)
RDW: 13.5 % (ref 11.5–15.5)
WBC: 7.8 10*3/uL (ref 4.0–10.5)

## 2015-12-19 LAB — BASIC METABOLIC PANEL
Anion gap: 7 (ref 5–15)
BUN: 14 mg/dL (ref 6–20)
CALCIUM: 9.3 mg/dL (ref 8.9–10.3)
CHLORIDE: 105 mmol/L (ref 101–111)
CO2: 28 mmol/L (ref 22–32)
CREATININE: 1.09 mg/dL (ref 0.61–1.24)
Glucose, Bld: 142 mg/dL — ABNORMAL HIGH (ref 65–99)
Potassium: 4.1 mmol/L (ref 3.5–5.1)
SODIUM: 140 mmol/L (ref 135–145)

## 2015-12-19 LAB — SURGICAL PCR SCREEN
MRSA, PCR: NEGATIVE
STAPHYLOCOCCUS AUREUS: NEGATIVE

## 2015-12-19 LAB — MRSA PCR SCREENING: MRSA BY PCR: NEGATIVE

## 2015-12-19 SURGERY — IRRIGATION AND DEBRIDEMENT EXTREMITY
Anesthesia: General | Site: Foot | Laterality: Left

## 2015-12-19 MED ORDER — LIDOCAINE HCL (CARDIAC) 20 MG/ML IV SOLN
INTRAVENOUS | Status: DC | PRN
  Administered 2015-12-19: 60 mg via INTRAVENOUS

## 2015-12-19 MED ORDER — GENTAMICIN SULFATE 40 MG/ML IJ SOLN
INTRAMUSCULAR | Status: AC
  Filled 2015-12-19: qty 2

## 2015-12-19 MED ORDER — ASPIRIN 325 MG PO TABS
325.0000 mg | ORAL_TABLET | Freq: Every day | ORAL | Status: DC
  Administered 2015-12-19 – 2015-12-23 (×5): 325 mg via ORAL
  Filled 2015-12-19 (×5): qty 1

## 2015-12-19 MED ORDER — LACTATED RINGERS IV SOLN
INTRAVENOUS | Status: DC | PRN
  Administered 2015-12-19: 10:00:00 via INTRAVENOUS

## 2015-12-19 MED ORDER — METOCLOPRAMIDE HCL 5 MG/ML IJ SOLN: 5.0000 mg | Freq: Three times a day (TID) | INTRAMUSCULAR | Status: DC | PRN

## 2015-12-19 MED ORDER — GENTAMICIN SULFATE 40 MG/ML IJ SOLN
INTRAMUSCULAR | Status: DC | PRN
  Administered 2015-12-19: 160 mg

## 2015-12-19 MED ORDER — MIDAZOLAM HCL 5 MG/5ML IJ SOLN
INTRAMUSCULAR | Status: DC | PRN
  Administered 2015-12-19: 2 mg via INTRAVENOUS

## 2015-12-19 MED ORDER — PROPOFOL 10 MG/ML IV BOLUS
INTRAVENOUS | Status: DC | PRN
  Administered 2015-12-19: 150 mg via INTRAVENOUS

## 2015-12-19 MED ORDER — VANCOMYCIN HCL 500 MG IV SOLR
INTRAVENOUS | Status: AC
  Filled 2015-12-19: qty 500

## 2015-12-19 MED ORDER — FENTANYL CITRATE (PF) 100 MCG/2ML IJ SOLN: 25.0000 ug | INTRAMUSCULAR | Status: DC | PRN

## 2015-12-19 MED ORDER — ROCURONIUM BROMIDE 50 MG/5ML IV SOLN
INTRAVENOUS | Status: AC
  Filled 2015-12-19: qty 1

## 2015-12-19 MED ORDER — SODIUM CHLORIDE 0.9 % IR SOLN
Status: DC | PRN
Start: 2015-12-19 — End: 2015-12-19
  Administered 2015-12-19 (×2): 1000 mL

## 2015-12-19 MED ORDER — ONDANSETRON HCL 4 MG PO TABS: 4.0000 mg | ORAL_TABLET | Freq: Four times a day (QID) | ORAL | Status: DC | PRN

## 2015-12-19 MED ORDER — HYDROMORPHONE HCL 1 MG/ML IJ SOLN: 0.2500 mg | INTRAMUSCULAR | Status: DC | PRN

## 2015-12-19 MED ORDER — ONDANSETRON HCL 4 MG/2ML IJ SOLN
INTRAMUSCULAR | Status: DC | PRN
  Administered 2015-12-19: 4 mg via INTRAVENOUS

## 2015-12-19 MED ORDER — METOCLOPRAMIDE HCL 5 MG PO TABS
5.0000 mg | ORAL_TABLET | Freq: Three times a day (TID) | ORAL | Status: DC | PRN
  Filled 2015-12-19: qty 2

## 2015-12-19 MED ORDER — ONDANSETRON HCL 4 MG/2ML IJ SOLN: 4.0000 mg | Freq: Four times a day (QID) | INTRAMUSCULAR | Status: DC | PRN

## 2015-12-19 MED ORDER — MIDAZOLAM HCL 2 MG/2ML IJ SOLN
INTRAMUSCULAR | Status: AC
  Filled 2015-12-19: qty 2

## 2015-12-19 MED ORDER — PROMETHAZINE HCL 25 MG/ML IJ SOLN: 6.2500 mg | INTRAMUSCULAR | Status: DC | PRN

## 2015-12-19 MED ORDER — FENTANYL CITRATE (PF) 250 MCG/5ML IJ SOLN
INTRAMUSCULAR | Status: AC
  Filled 2015-12-19: qty 5

## 2015-12-19 MED ORDER — ACETAMINOPHEN 325 MG PO TABS: 650.0000 mg | ORAL_TABLET | Freq: Four times a day (QID) | ORAL | Status: DC | PRN

## 2015-12-19 MED ORDER — FENTANYL CITRATE (PF) 100 MCG/2ML IJ SOLN
INTRAMUSCULAR | Status: DC | PRN
  Administered 2015-12-19: 50 ug via INTRAVENOUS

## 2015-12-19 MED ORDER — PROPOFOL 10 MG/ML IV BOLUS
INTRAVENOUS | Status: AC
  Filled 2015-12-19: qty 20

## 2015-12-19 MED ORDER — VANCOMYCIN HCL 500 MG IV SOLR
INTRAVENOUS | Status: DC | PRN
  Administered 2015-12-19: 1000 mg

## 2015-12-19 MED ORDER — PHENYLEPHRINE HCL 10 MG/ML IJ SOLN
INTRAMUSCULAR | Status: DC | PRN
  Administered 2015-12-19: 80 ug via INTRAVENOUS
  Administered 2015-12-19: 120 ug via INTRAVENOUS

## 2015-12-19 MED ORDER — ONDANSETRON HCL 4 MG/2ML IJ SOLN
INTRAMUSCULAR | Status: AC
  Filled 2015-12-19: qty 2

## 2015-12-19 MED ORDER — ACETAMINOPHEN 650 MG RE SUPP: 650.0000 mg | Freq: Four times a day (QID) | RECTAL | Status: DC | PRN

## 2015-12-19 MED ORDER — EPHEDRINE SULFATE 50 MG/ML IJ SOLN
INTRAMUSCULAR | Status: DC | PRN
  Administered 2015-12-19 (×2): 10 mg via INTRAVENOUS

## 2015-12-19 SURGICAL SUPPLY — 40 items
BANDAGE ACE 4X5 VEL STRL LF (GAUZE/BANDAGES/DRESSINGS) ×2 IMPLANT
BNDG GAUZE ELAST 4 BULKY (GAUZE/BANDAGES/DRESSINGS) IMPLANT
CANISTER WOUND CARE 500ML ATS (WOUND CARE) ×2 IMPLANT
COVER SURGICAL LIGHT HANDLE (MISCELLANEOUS) ×2 IMPLANT
DRAPE INCISE IOBAN 66X45 STRL (DRAPES) ×2 IMPLANT
DRAPE U-SHAPE 47X51 STRL (DRAPES) ×2 IMPLANT
DRSG PAD ABDOMINAL 8X10 ST (GAUZE/BANDAGES/DRESSINGS) IMPLANT
DRSG VAC ATS MED SENSATRAC (GAUZE/BANDAGES/DRESSINGS) ×2 IMPLANT
DRSG VAC ATS SM SENSATRAC (GAUZE/BANDAGES/DRESSINGS) ×2 IMPLANT
DRSG VERAFLO VAC MED (GAUZE/BANDAGES/DRESSINGS) ×2 IMPLANT
DURAPREP 26ML APPLICATOR (WOUND CARE) ×2 IMPLANT
GAUZE SPONGE 4X4 12PLY STRL (GAUZE/BANDAGES/DRESSINGS) IMPLANT
GAUZE XEROFORM 5X9 LF (GAUZE/BANDAGES/DRESSINGS) IMPLANT
GLOVE BIO SURGEON STRL SZ 6.5 (GLOVE) ×2 IMPLANT
GLOVE BIOGEL PI IND STRL 7.0 (GLOVE) ×3 IMPLANT
GLOVE BIOGEL PI IND STRL 8 (GLOVE) ×1 IMPLANT
GLOVE BIOGEL PI INDICATOR 7.0 (GLOVE) ×3
GLOVE BIOGEL PI INDICATOR 8 (GLOVE) ×1
GLOVE SURG ORTHO 8.0 STRL STRW (GLOVE) ×2 IMPLANT
GOWN STRL REUS W/ TWL LRG LVL3 (GOWN DISPOSABLE) ×2 IMPLANT
GOWN STRL REUS W/ TWL XL LVL3 (GOWN DISPOSABLE) ×1 IMPLANT
GOWN STRL REUS W/TWL LRG LVL3 (GOWN DISPOSABLE) ×2
GOWN STRL REUS W/TWL XL LVL3 (GOWN DISPOSABLE) ×1
KIT BASIN OR (CUSTOM PROCEDURE TRAY) ×2 IMPLANT
KIT ROOM TURNOVER OR (KITS) ×2 IMPLANT
KIT STIMULAN RAPID CURE 5CC (Orthopedic Implant) ×2 IMPLANT
MANIFOLD NEPTUNE II (INSTRUMENTS) ×2 IMPLANT
NS IRRIG 1000ML POUR BTL (IV SOLUTION) ×2 IMPLANT
PACK ORTHO EXTREMITY (CUSTOM PROCEDURE TRAY) ×2 IMPLANT
PAD ARMBOARD 7.5X6 YLW CONV (MISCELLANEOUS) ×2 IMPLANT
PAD CAST 4YDX4 CTTN HI CHSV (CAST SUPPLIES) IMPLANT
PADDING CAST COTTON 4X4 STRL (CAST SUPPLIES)
SPONGE LAP 18X18 X RAY DECT (DISPOSABLE) ×2 IMPLANT
STOCKINETTE IMPERVIOUS 9X36 MD (GAUZE/BANDAGES/DRESSINGS) ×2 IMPLANT
TOWEL OR 17X24 6PK STRL BLUE (TOWEL DISPOSABLE) ×2 IMPLANT
TOWEL OR 17X26 10 PK STRL BLUE (TOWEL DISPOSABLE) ×2 IMPLANT
TUBE CONNECTING 12X1/4 (SUCTIONS) ×2 IMPLANT
UNDERPAD 30X30 INCONTINENT (UNDERPADS AND DIAPERS) ×2 IMPLANT
WATER STERILE IRR 1000ML POUR (IV SOLUTION) ×2 IMPLANT
YANKAUER SUCT BULB TIP NO VENT (SUCTIONS) ×2 IMPLANT

## 2015-12-19 NOTE — Progress Notes (Signed)
Pharmacy Antibiotic Note  Shane [name] is a 45 y.o. male admitted on 12/16/2015 with wound infection.  Pharmacy has been consulted for zosyn and vancomycin dosing. Ortho excisional debridement, placement of antibiotic beads and irrigating wound VAC on 7/4  Lac 3.64>1.87, WBC wnl, AFeb SCr 1.34>0.99>1.02>1.09  Plan: -Zosyn 3.375g IV q8h -Vancomycin 1g IV 12h -Monitor renal fx, cx, vanc trough at SS    Height: 5\' 8"  (172.7 cm) Weight: 168 lb (76.204 kg) IBW/kg (Calculated) : 68.4  Temp (24hrs), Avg:98.1 F (36.7 C), Min:97.5 F (36.4 C), Max:98.5 F (36.9 C)   Recent Labs Lab 12/16/15 1515 12/16/15 1537 12/16/15 1831 12/17/15 0405 12/18/15 0454 12/19/15 0539  WBC 7.4  --   --  9.5 7.6 7.8  CREATININE 1.34*  --   --  0.99 1.02 1.09  LATICACIDVEN  --  3.64* 1.87  --   --   --     Estimated Creatinine Clearance: 81.9 mL/min (by C-G formula based on Cr of 1.09).    Allergies  Allergen Reactions  . Eggs Or Egg-Derived Products Diarrhea  . Other Other (See Comments)    Red meat causes stomach pains, bloating and diarrhea  . Milk-Related Compounds Diarrhea and Other (See Comments)    Any dairy products  - diarrhea and bloating    Antimicrobials this admission: Zosyn 7/1>> Vancomycin 7/1>>  Microbiology results: 7/1 Blood x2: ngtd 7/1 Urine: sent 7/4 MRSA PCR neg  Bennye Alm, PharmD Pharmacy Resident [phone]  12/19/2015 12:37 PM

## 2015-12-19 NOTE — Op Note (Signed)
[name], Shane [name]                ACCOUNT NO.:  [id number]  MEDICAL RECORD NO.:  [id number]  LOCATION:  [facility]                         FACILITY:  [facility]  PHYSICIAN:  Anderson Malta, M.D.    DATE OF BIRTH:  03-17-1971  DATE OF PROCEDURE: DATE OF DISCHARGE:                              OPERATIVE REPORT   PREOPERATIVE DIAGNOSIS:  Left foot infection.  POSTOP DIAGNOSIS:  Left foot infection.  PROCEDURE:  Left foot dorsal excisional debridement, placement of antibiotic beads, placement of irrigating wound VAC.  SURGEONS:  Anderson Malta, M.D.  ASSISTANT:  None.  ANESTHESIA:  General.  INDICATIONS:  [name] is a 45 year old patient with left foot infection, presents for operative management after explanation of risks and benefits.  PROCEDURE IN DETAIL:  The patient was brought to the operating room, where general anesthetic was induced.  Perioperative IV antibiotics were maintained.  The patient had a dorsal incision.  The dorsal wound on his foot at the level of 2nd, 3rd, and 4th metatarsal just proximal by about 2 cm.  Measured about 4 x 4 cm.  This area was expanded and devitalized. Skin, subcutaneous tissue and fascia was debrided both sharply and with a curette back to healthy-appearing bleeding bone.  Then extended distally to the base of the toes as well as proximally.  There was no overt purulence pocket encounter.  Deep fascia between metatarsals 2 and 3 also opened and muscle appeared viable without any evidence of infection or myositis.  This area was then irrigated and then peripherally where the skin was undermined from previous hematoma, yet viable was packed with antibiotic beads including the deep compartment, which was opened for inspection.  Then a Prevena irrigating wound VAC was placed.  Although, the patient does have palpable pedal pulses. Some of the tissue in the dorsal aspect of the foot appeared nonviable. The patient will be weightbearing as tolerated.  He  will keep that [place] wound VAC on for at least a week.     Anderson Malta, M.D.     GSD/MEDQ  D:  12/19/2015  T:  12/19/2015  Job:  [id number]

## 2015-12-19 NOTE — Anesthesia Preprocedure Evaluation (Signed)
Anesthesia Evaluation  Patient identified by MRN, date of birth, ID band Patient awake    Reviewed: Allergy & Precautions, NPO status , Patient's Chart, lab work & pertinent test results  Airway Mallampati: II  TM Distance: >3 FB Neck ROM: Full    Dental no notable dental hx.    Pulmonary neg pulmonary ROS,    Pulmonary exam normal breath sounds clear to auscultation       Cardiovascular hypertension, Pt. on medications + Peripheral Vascular Disease  Normal cardiovascular exam Rhythm:Regular Rate:Normal     Neuro/Psych Bipolar Disorder TIA   GI/Hepatic Neg liver ROS, GERD  ,  Endo/Other  diabetes, Insulin Dependent  Renal/GU Renal diseasenegative Renal ROS  negative genitourinary   Musculoskeletal negative musculoskeletal ROS (+)   Abdominal   Peds negative pediatric ROS (+)  Hematology  (+) anemia ,   Anesthesia Other Findings   Reproductive/Obstetrics negative OB ROS                             Anesthesia Physical Anesthesia Plan  ASA: III  Anesthesia Plan: General   Post-op Pain Management:    Induction: Intravenous  Airway Management Planned: LMA  Additional Equipment:   Intra-op Plan:   Post-operative Plan: Extubation in OR  Informed Consent: I have reviewed the patients History and Physical, chart, labs and discussed the procedure including the risks, benefits and alternatives for the proposed anesthesia with the patient or authorized representative who has indicated his/her understanding and acceptance.   Dental advisory given  Plan Discussed with: CRNA and Surgeon  Anesthesia Plan Comments:         Anesthesia Quick Evaluation

## 2015-12-19 NOTE — Transfer of Care (Signed)
Immediate Anesthesia Transfer of Care Note  Patient: Shane [name]  Procedure(s) Performed: Procedure(s): I & D LEFT FOOT WITH BEADS  (Left) APPLICATION OF WOUND VAC (Left)  Patient Location: PACU  Anesthesia Type:General  Level of Consciousness: awake, alert , oriented and patient cooperative  Airway & Oxygen Therapy: Patient Spontanous Breathing and Patient connected to nasal cannula oxygen  Post-op Assessment: Report given to RN and Post -op Vital signs reviewed and stable  Post vital signs: Reviewed and stable  Last Vitals:  Filed Vitals:   12/19/15 1114 12/19/15 1115  BP:  140/85  Pulse: 88 88  Temp: 36.6 C   Resp: 15 18    Last Pain:  Filed Vitals:   12/19/15 1117  PainSc: 0-No pain         Complications: No apparent anesthesia complications

## 2015-12-19 NOTE — Progress Notes (Signed)
Patient had recent ABI 12/02/15 within normal limits at rest.  Per Dr. Grandville Silos, place order on hold given recent normal results and wound location which may limit the ability to complete ABI.  12/19/2015 8:25 AM Maudry Mayhew, RVT, RDCS, RDMS

## 2015-12-19 NOTE — Brief Op Note (Signed)
12/16/2015 - 12/19/2015  11:11 AM  PATIENT:  Antony Madura  45 y.o. male  PRE-OPERATIVE DIAGNOSIS:  Infected Left Foot  POST-OPERATIVE DIAGNOSIS:  Infected Left Foot  PROCEDURE:  Procedure(s): I & D LEFT FOOT WITH BEADS  APPLICATION OF WOUND VAC  SURGEON:  Surgeon(s): Meredith Pel, MD  ASSISTANT: none  ANESTHESIA:   general  EBL: 15 ml    Total I/O In: -  Out: 150 [Urine:150]  BLOOD ADMINISTERED: none  DRAINS: provena irrigating vac   LOCAL MEDICATIONS USED:  none  SPECIMEN:  No Specimen  COUNTS:  YES  TOURNIQUET:  * No tourniquets in log *  DICTATION: .Other Dictation: Dictation Number [id number]  PLAN OF CARE: Admit to inpatient   PATIENT DISPOSITION:  PACU - hemodynamically stable

## 2015-12-19 NOTE — Progress Notes (Signed)
PT Cancellation Note  Patient Details Name: Shane [name] MRN: [id number] DOB: Aug 23, 1970   Cancelled Treatment:    Reason Eval/Treat Not Completed: Patient at procedure or test/unavailable. Pt off unit at this time. Per chart review, pt to go to OR today for placement of antibiotic beads and wound vac placement. Hydrotherapy will continue to follow for appropriateness to continue with current POC.   Rolinda Roan 12/19/2015, 9:25 AM   Rolinda Roan, PT, DPT Acute Rehabilitation Services Pager: [phone]

## 2015-12-19 NOTE — Anesthesia Postprocedure Evaluation (Signed)
Anesthesia Post Note  Patient: Shane [name]  Procedure(s) Performed: Procedure(s) (LRB): I & D LEFT FOOT WITH BEADS  (Left) APPLICATION OF WOUND VAC (Left)  Patient location during evaluation: PACU Anesthesia Type: General Level of consciousness: awake and alert Pain management: pain level controlled Vital Signs Assessment: post-procedure vital signs reviewed and stable Respiratory status: spontaneous breathing, nonlabored ventilation, respiratory function stable and patient connected to nasal cannula oxygen Cardiovascular status: blood pressure returned to baseline and stable Postop Assessment: no signs of nausea or vomiting Anesthetic complications: no    Last Vitals:  Filed Vitals:   12/19/15 1114 12/19/15 1115  BP:  140/85  Pulse: 88 88  Temp: 36.6 C   Resp: 15 18    Last Pain:  Filed Vitals:   12/19/15 1117  PainSc: 0-No pain                 Italo Banton S

## 2015-12-19 NOTE — Progress Notes (Signed)
PROGRESS NOTE    Shane [name]  [id number] DOB: 1971/01/12 DOA: 12/16/2015 PCP: Minerva Ends, MD    Brief Narrative:   Shane [name] is a 45 y.o. male with diabetes mellitus type 2, hypertension, hyperlipidemia who was discharged on June 19 after being treated for left foot wound infection requiring second toe amputation and wound debridement and was again admitted and discharged 3 days ago for TIA presents to the ER for the second time with complaints of increasing swelling bloody discharge and numbness of the left foot for the last 3 days. Denies any weakness of upper or lower extremities. There is no foot drop. On exam there is mild discharge on the dressing on the left foot. Left foot looks swollen. Patient states he has been also having subjective feeling of fever chills. Patient has been admitted for further management. Patient states he did follow-up with orthopedic surgeon Dr. Randel Pigg office on Friday and had dressings changed.  ED Course: Patient has been empirically started on antibiotics. On arrival patient was hypotensive tachycardic which improved with IV fluid boluses for sepsis.  Patient was admitted orthopedic was consulted who assessed the patient recommended wet-to-dry dressing changes as well as hydrotherapy. Patient to the OR today, 12/19/2015 for antibiotic be placement and wound VAC per orthopedics.  Assessment & Plan:   Principal Problem:   SIRS (systemic inflammatory response syndrome) (HCC) Active Problems:   Wound dehiscence   Essential hypertension   Chronic anemia   Wound infection ([place])   Type 2 diabetes mellitus with vascular disease (HCC)   Cellulitis of left foot  #1 systemic inflammatory response syndrome Likely secondary to left foot infection. Left foot swollen. MRI of the left foot with marked improvement in subcutaneous abscess on the dorsum of the foot. Residual abscess deep to the incision. Blood cultures pending. Continue empiric Antibiotics.  Patient has been seen by orthopedics and patient for antibiotic be placement and wound VAC per orthopedics today. Follow.  #2 left foot recurrent deep wound infection/wound dehiscence MRI left foot with significant improvement in subcutaneous abscess on the dorsum of the foot. Residual abscess deep to the incision. Blood cultures pending. Patient has been seen in consultation by orthopedics who agree on continue empiric IV antibiotics and local wound care with wet-to-dry dressing changes and hydrotherapy. Patient scheduled for antibiotic be placement and wound VAC today per orthopedics. Per orthopedics.  #3 left lower extremity numbness Patient complaining of numbness from foot to the calf region. Likely secondary to problem #2 with swelling. Patient had recent ABIs done 12/02/2015 which are within normal limits at rest. Will cancel order for ABI.  #4 hypertension Continue Norvasc. Coreg and lisinopril on hold.  #5 diabetes mellitus type 2 Hemoglobin A1c is 8.3. CBGs have ranged from 158-193. Hold oral hypoglycemic agents. Sliding scale insulin.  #6 history of TIA Aspirin on hold secondary to bloody discharge noted on left foot. Orthopedics to advise when aspirin may be resumed.  #7 chronic anemia Follow H&H.  #8 peripheral neuropathy Neurontin.  #9 hyperlipidemia Continue statin.    DVT prophylaxis: SCDs Code Status: Full Family Communication: Updated patient. No family at bedside. Disposition Plan: Pending orthopedic evaluation.   Consultants:   Orthopedics: Dr Erlinda Hong 12/17/2015  Procedures:   MRI left foot 12/17/2015  Antimicrobials:   IV Zosyn 12/16/2015  IV vancomycin 12/16/2015   Subjective: Patient c/o numbness in RLE from his foot to his calf region. No CP. No SOB.  Objective: Filed Vitals:   12/18/15 0522 12/18/15 1431  12/18/15 2148 12/19/15 0600  BP: 124/76 116/80 146/94 115/63  Pulse: 79 80 84 80  Temp: 98.3 F (36.8 C) 98.5 F (36.9 C) 98.2 F (36.8 C)  98.4 F (36.9 C)  TempSrc: Oral Oral Oral Oral  Resp: 18 16 17 16   Height:      Weight:      SpO2: 100% 97% 100% 98%    Intake/Output Summary (Last 24 hours) at 12/19/15 0910 Last data filed at 12/18/15 2357  Gross per 24 hour  Intake    760 ml  Output    420 ml  Net    340 ml   Filed Weights   12/16/15 1502 12/16/15 1559  Weight: 72.576 kg (160 lb) 76.204 kg (168 lb)    Examination:  General exam: Appears calm and comfortable  Respiratory system: Clear to auscultation. Respiratory effort normal. Cardiovascular system: S1 & S2 heard, RRR. No JVD, murmurs, rubs, gallops or clicks. No pedal edema. Gastrointestinal system: Abdomen is nondistended, soft and nontender. No organomegaly or masses felt. Normal bowel sounds heard. Central nervous system: Alert and oriented. No focal neurological deficits. Extremities: LLE in bandage. Skin: No rashes, lesions or ulcers Psychiatry: Judgement and insight appear normal. Mood & affect appropriate.     Data Reviewed: I have personally reviewed following labs and imaging studies  CBC:  Recent Labs Lab 12/16/15 1515 12/17/15 0405 12/18/15 0454 12/19/15 0539  WBC 7.4 9.5 7.6 7.8  NEUTROABS 5.0  --   --   --   HGB 10.7* 10.3* 9.8* 10.3*  HCT 33.9* 32.5* 30.3* 32.2*  MCV 95.5 95.3 93.5 93.9  PLT 442* 411* 355 0000000   Basic Metabolic Panel:  Recent Labs Lab 12/16/15 1515 12/17/15 0405 12/18/15 0454 12/19/15 0539  NA 142 140 145 140  K 4.5 4.7 3.9 4.1  CL 110 108 100* 105  CO2 24 26 27 28   GLUCOSE 90 199* 156* 142*  BUN 21* 15 12 14   CREATININE 1.34* 0.99 1.02 1.09  CALCIUM 9.6 9.2 10.1 9.3   GFR: Estimated Creatinine Clearance: 81.9 mL/min (by C-G formula based on Cr of 1.09). Liver Function Tests:  Recent Labs Lab 12/16/15 1515  AST 20  ALT 24  ALKPHOS 48  BILITOT 0.4  PROT 7.2  ALBUMIN 3.7   No results for input(s): LIPASE, AMYLASE in the last 168 hours. No results for input(s): AMMONIA in the last 168  hours. Coagulation Profile: No results for input(s): INR, PROTIME in the last 168 hours. Cardiac Enzymes: No results for input(s): CKTOTAL, CKMB, CKMBINDEX, TROPONINI in the last 168 hours. BNP (last 3 results) No results for input(s): PROBNP in the last 8760 hours. HbA1C: No results for input(s): HGBA1C in the last 72 hours. CBG:  Recent Labs Lab 12/18/15 1202 12/18/15 1711 12/18/15 2107 12/19/15 0747 12/19/15 0854  GLUCAP 170* 212* 193* 177* 158*   Lipid Profile: No results for input(s): CHOL, HDL, LDLCALC, TRIG, CHOLHDL, LDLDIRECT in the last 72 hours. Thyroid Function Tests: No results for input(s): TSH, T4TOTAL, FREET4, T3FREE, THYROIDAB in the last 72 hours. Anemia Panel: No results for input(s): VITAMINB12, FOLATE, FERRITIN, TIBC, IRON, RETICCTPCT in the last 72 hours. Sepsis Labs:  Recent Labs Lab 12/16/15 1537 12/16/15 1831 12/16/15 2234 12/18/15 0454  PROCALCITON  --   --  <0.10 <0.10  LATICACIDVEN 3.64* 1.87  --   --     Recent Results (from the past 240 hour(s))  Blood Culture (routine x 2)     Status: None (Preliminary  result)   Collection Time: 12/16/15  3:50 PM  Result Value Ref Range Status   Specimen Description BLOOD RIGHT ANTECUBITAL  Final   Special Requests BOTTLES DRAWN AEROBIC AND ANAEROBIC 5CC  Final   Culture NO GROWTH 2 DAYS  Final   Report Status PENDING  Incomplete  Blood Culture (routine x 2)     Status: None (Preliminary result)   Collection Time: 12/16/15  3:55 PM  Result Value Ref Range Status   Specimen Description BLOOD LEFT ANTECUBITAL  Final   Special Requests BOTTLES DRAWN AEROBIC ONLY 10CC  Final   Culture NO GROWTH 2 DAYS  Final   Report Status PENDING  Incomplete  MRSA PCR Screening     Status: None   Collection Time: 12/18/15 10:43 PM  Result Value Ref Range Status   MRSA by PCR NEGATIVE NEGATIVE Final    Comment:        The GeneXpert MRSA Assay (FDA approved for NASAL specimens only), is one component of  a comprehensive MRSA colonization surveillance program. It is not intended to diagnose MRSA infection nor to guide or monitor treatment for MRSA infections.   Surgical pcr screen     Status: None   Collection Time: 12/19/15  7:03 AM  Result Value Ref Range Status   MRSA, PCR NEGATIVE NEGATIVE Final   Staphylococcus aureus NEGATIVE NEGATIVE Final    Comment:        The Xpert SA Assay (FDA approved for NASAL specimens in patients over 64 years of age), is one component of a comprehensive surveillance program.  Test performance has been validated by [facility] for patients greater than or equal to 29 year old. It is not intended to diagnose infection nor to guide or monitor treatment.          Radiology Studies: No results found.      Scheduled Meds: . amLODipine  5 mg Oral Daily  . atorvastatin  80 mg Oral q1800  . divalproex  1,000 mg Oral QHS  . ferrous sulfate  325 mg Oral Q breakfast  . FLUoxetine  10 mg Oral Daily  . gabapentin  100 mg Oral Daily  . insulin aspart  0-9 Units Subcutaneous TID WC  . pantoprazole  40 mg Oral Daily  . piperacillin-tazobactam (ZOSYN)  IV  3.375 g Intravenous Q8H  . sodium chloride  250 mL Intravenous Once  . vancomycin  1,000 mg Intravenous Q12H   Continuous Infusions:     LOS: 3 days    Time spent: 54 mins    THOMPSON,DANIEL, MD [facility] Pager [phone]  If 7PM-7AM, please contact night-coverage [email or web address] Password [email or web address] 12/19/2015, 9:10 AM

## 2015-12-20 ENCOUNTER — Encounter (HOSPITAL_COMMUNITY): Payer: Self-pay | Admitting: Orthopedic Surgery

## 2015-12-20 ENCOUNTER — Inpatient Hospital Stay (HOSPITAL_COMMUNITY): Payer: Medicaid Other

## 2015-12-20 LAB — GLUCOSE, CAPILLARY
GLUCOSE-CAPILLARY: 242 mg/dL — AB (ref 65–99)
GLUCOSE-CAPILLARY: 153 mg/dL — AB (ref 65–99)
GLUCOSE-CAPILLARY: 258 mg/dL — AB (ref 65–99)
Glucose-Capillary: 168 mg/dL — ABNORMAL HIGH (ref 65–99)

## 2015-12-20 LAB — BASIC METABOLIC PANEL
ANION GAP: 7 (ref 5–15)
BUN: 15 mg/dL (ref 6–20)
CO2: 27 mmol/L (ref 22–32)
Calcium: 9 mg/dL (ref 8.9–10.3)
Chloride: 103 mmol/L (ref 101–111)
Creatinine, Ser: 1.1 mg/dL (ref 0.61–1.24)
GFR calc non Af Amer: >60 mL/min (ref >60)
GLUCOSE: 206 mg/dL — AB (ref 65–99)
POTASSIUM: 4.1 mmol/L (ref 3.5–5.1)
Sodium: 137 mmol/L (ref 135–145)

## 2015-12-20 LAB — CBC
HCT: 32.6 % — ABNORMAL LOW (ref 39.0–52.0)
Hemoglobin: 10.3 g/dL — ABNORMAL LOW (ref 13.0–17.0)
MCH: 30.4 pg (ref 26.0–34.0)
MCHC: 31.6 g/dL (ref 30.0–36.0)
MCV: 96.2 fL (ref 78.0–100.0)
PLATELETS: 340 10*3/uL (ref 150–400)
RBC: 3.39 MIL/uL — ABNORMAL LOW (ref 4.22–5.81)
RDW: 13.4 % (ref 11.5–15.5)
WBC: 8 10*3/uL (ref 4.0–10.5)

## 2015-12-20 LAB — PROCALCITONIN: Procalcitonin: <0.1 ng/mL

## 2015-12-20 MED ORDER — GADOBENATE DIMEGLUMINE 529 MG/ML IV SOLN
15.0000 mL | Freq: Once | INTRAVENOUS | Status: AC | PRN
  Administered 2015-12-20: 15 mL via INTRAVENOUS

## 2015-12-20 NOTE — Progress Notes (Signed)
Ascending paresthesias and leg weakness left May be DM manifestation vs disc vs lsp infection Needs lsp mri today

## 2015-12-20 NOTE — Progress Notes (Signed)
PT Cancellation Note  Patient Details Name: Shane [name] MRN: [id number] DOB: 24-Apr-1971   Cancelled Treatment:    Reason Eval/Treat Not Completed: Patient declined, no reason specified;Medical issues which prohibited therapy.  Pt stating that he now is unable to feel anything from waist down on ?R and awaiting MRI.  Pt asks to wait until results are back and understood before proceeding with evaluation. 12/20/2015  Donnella Sham, [place] [phone]  (pager)   Shamari Trostel, Tessie Fass 12/20/2015, 2:59 PM

## 2015-12-20 NOTE — Progress Notes (Signed)
Pt said he was reaching for his urinal and hit his left arm against the bedside table and the arm feel sore, i have given him PRN oxycodone and will continue to monitor

## 2015-12-20 NOTE — Progress Notes (Signed)
[facility]                                                                              Patient Demographics  Shane [name], is a 45 y.o. male, DOB - 26-May-1971, CK:[id number]  Admit date - 12/16/2015   Admitting Physician Rise Patience, MD  Outpatient Primary MD for the patient is Minerva Ends, MD  Outpatient specialists:   LOS - 4  days    Chief Complaint  Patient presents with  . foot infection        Brief summary   Shane [name] is a 45 y.o. male with diabetes mellitus type 2, hypertension, hyperlipidemia who was discharged on June 19 after being treated for left foot wound infection requiring second toe amputation and wound debridement and was again admitted and discharged 3 days ago for TIA presents to the ER for the second time with complaints of increasing swelling bloody discharge and numbness of the left foot for the last 3 days. Denies any weakness of upper or lower extremities. There is no foot drop. On exam there was mild discharge on the dressing on the left foot. Left foot looked swollen. Patient reported subjective feeling of fever chills. Patient has been admitted for further management. Patient states he did follow-up with orthopedic surgeon Dr. Randel Pigg office on Friday and had dressings changed. Patient has been empirically started on antibiotics. On arrival patient was hypotensive tachycardic which improved with IV fluid boluses for sepsis. Patient was admitted orthopedic was consulted who assessed the patient recommended wet-to-dry dressing changes as well as hydrotherapy. Patient to the OR today, 12/19/2015 for antibiotic be placement and wound VAC per orthopedics.   Assessment & Plan    Principal Problem:   SIRS (systemic inflammatory response syndrome) (HCC) - Likely secondary to left foot infection. MRI of the left foot with marked improvement in subcutaneous abscess on the dorsum of the foot. Residual abscess deep to the  incision.  - Orthopedics was consulted and patient underwent left foot dorsal excisional debridement, placement of antibiotic beads and irrigation wound VAC.  - Blood cultures negative so far. Continue IV vancomycin and Zosyn.   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 Residual abscess deep to the incision. - Underwent debridement and wound VAC placement - Follow cultures, continue IV antibiotics.   left lower extremity numbness, likely due to neuropathy and circulation Patient complaining of numbness from foot to the calf region. Likely secondary to problem #2 with swelling. Patient had recent ABIs done 12/02/2015 which are within normal limits at rest.    hypertension Continue Norvasc. Coreg and lisinopril on hold.  diabetes mellitus type 2 Hemoglobin A1c is 8.3. CBGs have ranged from 158-193. Hold oral hypoglycemic agents. Sliding scale insulin.   history of TIA Continue aspirin, statin   chronic anemia Follow H&H.   peripheral neuropathy Neurontin.   hyperlipidemia Continue statin.  Code Status: Full code DVT Prophylaxis:  SCD's Family Communication: Discussed in detail with the patient, all imaging results, lab results explained to the patient  Disposition Plan:   Time Spent in minutes   25 minutes  Procedures:  Excisional debridement, placement of antibiotic beads, wound VAC on 7/4  Consultants:   Orthopedics  Antimicrobials:   IV vancomycin  IV Zosyn   Medications  Scheduled Meds: . amLODipine  5 mg Oral Daily  . aspirin  325 mg Oral Daily  . atorvastatin  80 mg Oral q1800  . divalproex  1,000 mg Oral QHS  . ferrous sulfate  325 mg Oral Q breakfast  . FLUoxetine  10 mg Oral Daily  . gabapentin  100 mg Oral Daily  . insulin aspart  0-9 Units Subcutaneous TID WC  . pantoprazole  40 mg Oral Daily  . piperacillin-tazobactam (ZOSYN)  IV   3.375 g Intravenous Q8H  . sodium chloride  250 mL Intravenous Once  . vancomycin  1,000 mg Intravenous Q12H   Continuous Infusions:  PRN Meds:.acetaminophen **OR** acetaminophen, metoCLOPramide **OR** metoCLOPramide (REGLAN) injection, ondansetron **OR** ondansetron (ZOFRAN) IV, oxyCODONE-acetaminophen, traZODone   Antibiotics   Anti-infectives    Start     Dose/Rate Route Frequency Ordered Stop   12/19/15 0956  gentamicin (GARAMYCIN) injection  Status:  Discontinued       As needed 12/19/15 0957 12/19/15 1110   12/19/15 0949  vancomycin (VANCOCIN) powder  Status:  Discontinued       As needed 12/19/15 0949 12/19/15 1110   12/17/15 0400  vancomycin (VANCOCIN) IVPB 1000 mg/200 mL premix     1,000 mg 200 mL/hr over 60 Minutes Intravenous Every 12 hours 12/16/15 1716     12/17/15 0000  piperacillin-tazobactam (ZOSYN) IVPB 3.375 g     3.375 g 12.5 mL/hr over 240 Minutes Intravenous Every 8 hours 12/16/15 1716     12/16/15 1600  vancomycin (VANCOCIN) 1,750 mg in sodium chloride 0.9 % 500 mL IVPB     1,750 mg 250 mL/hr over 120 Minutes Intravenous  Once 12/16/15 1550 12/16/15 1828   12/16/15 1600  piperacillin-tazobactam (ZOSYN) IVPB 3.375 g     3.375 g 12.5 mL/hr over 240 Minutes Intravenous  Once 12/16/15 1550 12/16/15 1827        Subjective:   Shane [name] was seen and examined today.  Dressing intact on the left lower extremity, with wound VAC on, still feels numbness in his left lower leg.Patient denies dizziness, chest pain, shortness of breath, abdominal pain, N/V/D/C, new weakness, numbess, tingling. No acute events overnight.    Objective:   Filed Vitals:   12/19/15 1155 12/19/15 1416 12/19/15 2127 12/20/15 0633  BP: 162/86 157/97 135/78 155/89  Pulse: 88 91 87 77  Temp: 97.5 F (36.4 C) 97.9 F (36.6 C) 97.9 F (36.6 C) 98 F (36.7 C)  TempSrc: Oral   Oral  Resp: 20 16 18 18   Height:      Weight:      SpO2: 98% 97% 98% 99%    Intake/Output Summary (Last 24  hours) at 12/20/15 1227 Last data filed at 12/20/15 1220  Gross per 24 hour  Intake   1080 ml  Output   1000 ml  Net     80 ml     Wt Readings from Last 3 Encounters:  12/16/15 76.204 kg (168 lb)  12/11/15 79.4 kg (175 lb 0.7 oz)  12/01/15 77.1 kg (169 lb 15.6 oz)     Exam  General: Alert and oriented x 3, NAD  HEENT:  PERRLA, EOMI, Anicteric Sclera, mucous membranes moist.   Neck: Supple, no JVD, no masses  Cardiovascular: S1 S2 auscultated, no rubs, murmurs or gallops. Regular rate and rhythm.  Respiratory: Clear to auscultation bilaterally, no wheezing, rales or rhonchi  Gastrointestinal: Soft, nontender, nondistended, + bowel sounds  Ext: no cyanosis clubbing. Left leg dressing intact with wound VAC  Neuro: AAOx3, Cr N's II- XII. Strength 5/5 upper and lower extremities bilaterally  Skin: No rashes  Psych: Normal affect and demeanor, alert and oriented x3    Data Reviewed:  I have personally reviewed following labs and imaging studies  Micro Results Recent Results (from the past 240 hour(s))  Blood Culture (routine x 2)     Status: None (Preliminary result)   Collection Time: 12/16/15  3:50 PM  Result Value Ref Range Status   Specimen Description BLOOD RIGHT ANTECUBITAL  Final   Special Requests BOTTLES DRAWN AEROBIC AND ANAEROBIC 5CC  Final   Culture NO GROWTH 3 DAYS  Final   Report Status PENDING  Incomplete  Blood Culture (routine x 2)     Status: None (Preliminary result)   Collection Time: 12/16/15  3:55 PM  Result Value Ref Range Status   Specimen Description BLOOD LEFT ANTECUBITAL  Final   Special Requests BOTTLES DRAWN AEROBIC ONLY 10CC  Final   Culture NO GROWTH 3 DAYS  Final   Report Status PENDING  Incomplete  MRSA PCR Screening     Status: None   Collection Time: 12/18/15 10:43 PM  Result Value Ref Range Status   MRSA by PCR NEGATIVE NEGATIVE Final    Comment:        The GeneXpert MRSA Assay (FDA approved for NASAL specimens only), is  one component of a comprehensive MRSA colonization surveillance program. It is not intended to diagnose MRSA infection nor to guide or monitor treatment for MRSA infections.   Surgical pcr screen     Status: None   Collection Time: 12/19/15  7:03 AM  Result Value Ref Range Status   MRSA, PCR NEGATIVE NEGATIVE Final   Staphylococcus aureus NEGATIVE NEGATIVE Final    Comment:        The Xpert SA Assay (FDA approved for NASAL specimens in patients over 31 years of age), is one component of a comprehensive surveillance program.  Test performance has been validated by [facility] for patients greater than or equal to 66 year old. It is not intended to diagnose infection nor to guide or monitor treatment.     Radiology Reports Dg Chest 2 View  12/16/2015  CLINICAL DATA:  Toe amputation recently.  Possible sepsis. EXAM: CHEST  2 VIEW COMPARISON:  12/11/2015 FINDINGS: Lungs are adequately inflated without focal consolidation, effusion or pneumothorax. Cardiomediastinal silhouette and remainder of the exam is unchanged. IMPRESSION: No acute disease. Electronically Signed   By: Marin Olp M.D.   On: 12/16/2015 18:15   Dg Chest 2 View  12/11/2015  CLINICAL DATA:  TIA EXAM: CHEST  2 VIEW COMPARISON:  12/01/2015 FINDINGS: Heart and mediastinal contours are within normal limits. No focal opacities or effusions. No acute bony abnormality. IMPRESSION: No active cardiopulmonary disease. Electronically Signed   By: Rolm Baptise M.D.   On: 12/11/2015 07:40   Dg Chest 2 View  12/01/2015  CLINICAL DATA:  Chest pain for couple of hours. EXAM: CHEST  2 VIEW COMPARISON:  08/26/2015 FINDINGS: The heart size and mediastinal contours are within normal limits. Both lungs are clear. The visualized skeletal structures are unremarkable. IMPRESSION: No active cardiopulmonary disease. Electronically Signed   By: Oren Beckmann.D.  On: 12/01/2015 01:37   Mr Brain Wo Contrast  12/11/2015  CLINICAL DATA:   Recurrent acute LEFT-sided numbness and weakness. Headache. EXAM: MRI HEAD WITHOUT CONTRAST TECHNIQUE: Multiplanar, multiecho pulse sequences of the brain and surrounding structures were obtained without intravenous contrast. COMPARISON:  None. FINDINGS: No evidence for acute infarction, hemorrhage, mass lesion, hydrocephalus, or extra-axial fluid. Normal cerebral volume. No significant white matter disease. Pituitary, pineal, and cerebellar tonsils unremarkable. No upper cervical lesions. Flow voids are maintained throughout the carotid, basilar, and vertebral arteries. There are no areas of chronic hemorrhage. Visualized calvarium, skull base, and upper cervical osseous structures unremarkable. Scalp and extracranial soft tissues, orbits, sinuses, and mastoids show no acute process. IMPRESSION: Negative exam.  No acute or focal intracranial findings. Electronically Signed   By: Staci Righter M.D.   On: 12/11/2015 13:34   Mr Foot Left Wo Contrast  12/17/2015  CLINICAL DATA:  Cellulitis of the left foot. Drainage of an abscess on December 02, 2015. EXAM: MRI OF THE LEFT FOOT WITHOUT CONTRAST TECHNIQUE: Multiplanar, multisequence MR imaging was performed. No intravenous contrast was administered. COMPARISON:  Radiographs dated 12/16/2015 and MRI dated 12/01/2015 FINDINGS: There has been marked reduction in the size of the subcutaneous abscess on the dorsum of the left foot. The abscess now measures approximately 4.3 cm in width and 3.9 cm in length and approximately 6 mm in depth. This communicates with the soft tissue wound on the dorsum of the foot which is centered over the abscess. Marked reduction in the subcutaneous edema on the dorsum of the foot. There is no osteomyelitis or joint effusions.  No myositis. IMPRESSION: Marked improvement in the subcutaneous abscess on the dorsum of the foot. Residual abscess deep to the incision as described. Electronically Signed   By: Lorriane Shire M.D.   On: 12/17/2015 12:16     Mr Foot Left Wo Contrast  12/02/2015  CLINICAL DATA:  Left foot pain and left second toe swelling for 2 weeks. Fever and chills. Question abscess or osteomyelitis. EXAM: MRI OF THE LEFT FOREFOOT WITHOUT CONTRAST TECHNIQUE: Multiplanar, multisequence MR imaging was performed. No intravenous contrast was administered. COMPARISON:  Plain films left foot 11/30/2015. MRI left foot 08/02/2015. FINDINGS: Amputation of the great toe is identified as seen on comparison plain films. Marked soft tissue edema is seen over the dorsum of the foot. A large area of heterogeneous signal abnormality is seen in the subcutaneous soft tissues over the dorsum of the foot. This area measures up to 4.4 cm transverse at the level of the distal metatarsals by 1.8 cm craniocaudal by 5.6 cm long. Smaller areas of similar signal abnormality are seen extending to the level of the mid foot. The coronal STIR sequence demonstrates mild appearing edema distal phalanx of the second toe but this cannot be confirmed on other sequences. Bone marrow signal is otherwise unremarkable. IMPRESSION: Intense edema over the dorsum of the foot is compatible with cellulitis. Heterogeneous areas of signal abnormality in the dorsum of the foot is worrisome for abscess. Mildly increased T2 signal in the coronal STIR sequence in the distal phalanx of the second toe cannot be confirmed on other sequences. No convincing evidence of osteomyelitis is identified. Electronically Signed   By: Inge Rise M.D.   On: 12/02/2015 08:43   Dg Foot 2 Views Left  11/22/2015  CLINICAL DATA:  Left foot pain/ swelling at site of great toe amputation EXAM: LEFT FOOT - 2 VIEW COMPARISON:  08/13/2015 FINDINGS: Status post left 1st  digit amputation at the MTP joint. No fracture or dislocation is seen. The joint spaces are preserved. Mild soft tissue swelling overlying the 1st digit. No radiopaque foreign body is seen. IMPRESSION: Mild soft tissue swelling at the site of 1st  digit amputation. No radiopaque foreign body is seen. No fracture or dislocation is seen. Electronically Signed   By: Julian Hy M.D.   On: 11/22/2015 09:36   Dg Foot Complete Left  12/16/2015  CLINICAL DATA:  Left foot numbness.  Rule out osteomyelitis EXAM: LEFT FOOT - COMPLETE 3+ VIEW COMPARISON:  12/14/2015 FINDINGS: Amputation of the great toe at the level of the MTP joint. Amputation of the second toe at the level of the distal aspect of the proximal phalanx. Negative for osteomyelitis. No fracture or arthropathy. No change from the recent study. Arterial calcification. Wound on the dorsum of the foot. IMPRESSION: No interval change.  Negative for osteomyelitis. Electronically Signed   By: Franchot Gallo M.D.   On: 12/16/2015 19:11   Dg Foot Complete Left  12/14/2015  CLINICAL DATA:  Awoke with complete numbness LEFT foot, no feeling, no pain, surgery 1 week ago with partial amputation of second toe and I & D of infected area, swelling, history type II diabetes mellitus, CHF, hyperlipidemia, hypertension EXAM: LEFT FOOT - COMPLETE 3+ VIEW COMPARISON:  11/30/2015 FINDINGS: Osseous mineralization normal. Prior amputation great toe. Interval amputation second toe through head of proximal phalanx. Soft tissue swelling of forefoot and second toe stump. No acute fracture, dislocation, or bone destruction. Scattered small vessel vascular calcification. IMPRESSION: Interval amputation of second toe through head of proximal phalanx. Prior amputation great toe. No acute osseous abnormalities. Electronically Signed   By: Lavonia Dana M.D.   On: 12/14/2015 15:15   Dg Foot Complete Left  12/01/2015  CLINICAL DATA:  Left foot wound. Diabetes. Second toe nail fell off 5 days ago. EXAM: LEFT FOOT - COMPLETE 3+ VIEW COMPARISON:  11/21/2015 FINDINGS: Previous amputation of the left first toe at the metatarsal-phalangeal joint level. Soft tissues at the site of resection or prominent but unchanged since previous  study. No radiopaque soft tissue foreign bodies or gas collections. Vascular calcifications. No bone erosion or sclerosis to suggest osteomyelitis. No acute fracture or dislocation. IMPRESSION: Previous amputation of the left first toe. No evidence of osteomyelitis. Electronically Signed   By: Lucienne Capers M.D.   On: 12/01/2015 00:27   Ct Head Code Stroke W/o Cm  12/10/2015  CLINICAL DATA:  45 year old male with code stroke. EXAM: CT HEAD WITHOUT CONTRAST TECHNIQUE: Contiguous axial images were obtained from the base of the skull through the vertex without intravenous contrast. COMPARISON:  Head CT dated 08/26/2015 and MRI dated 08/26/2015 FINDINGS: The ventricles and the sulci are appropriate in size for the patient's age. There is no intracranial hemorrhage. No midline shift or mass effect identified. The gray-white matter differentiation is preserved. The visualized paranasal sinuses and mastoid air cells are well aerated. The calvarium is intact. IMPRESSION: No acute intracranial pathology. These results were called by telephone at the time of interpretation on 12/10/2015 at 9:08 pm to Dr. Nicole Kindred, who verbally acknowledged these results. Electronically Signed   By: Anner Crete M.D.   On: 12/10/2015 21:16    Lab Data:  CBC:  Recent Labs Lab 12/16/15 1515 12/17/15 0405 12/18/15 0454 12/19/15 0539 12/20/15 0531  WBC 7.4 9.5 7.6 7.8 8.0  NEUTROABS 5.0  --   --   --   --   HGB  10.7* 10.3* 9.8* 10.3* 10.3*  HCT 33.9* 32.5* 30.3* 32.2* 32.6*  MCV 95.5 95.3 93.5 93.9 96.2  PLT 442* 411* 355 384 [id number]   Basic Metabolic Panel:  Recent Labs Lab 12/16/15 1515 12/17/15 0405 12/18/15 0454 12/19/15 0539 12/20/15 0531  NA 142 140 145 140 137  K 4.5 4.7 3.9 4.1 4.1  CL 110 108 100* 105 103  CO2 24 26 27 28 27   GLUCOSE 90 199* 156* 142* 206*  BUN 21* 15 12 14 15   CREATININE 1.34* 0.99 1.02 1.09 1.10  CALCIUM 9.6 9.2 10.1 9.3 9.0   GFR: Estimated Creatinine Clearance: 81.2 mL/min  (by C-G formula based on Cr of 1.1). Liver Function Tests:  Recent Labs Lab 12/16/15 1515  AST 20  ALT 24  ALKPHOS 48  BILITOT 0.4  PROT 7.2  ALBUMIN 3.7   No results for input(s): LIPASE, AMYLASE in the last 168 hours. No results for input(s): AMMONIA in the last 168 hours. Coagulation Profile: No results for input(s): INR, PROTIME in the last 168 hours. Cardiac Enzymes: No results for input(s): CKTOTAL, CKMB, CKMBINDEX, TROPONINI in the last 168 hours. BNP (last 3 results) No results for input(s): PROBNP in the last 8760 hours. HbA1C: No results for input(s): HGBA1C in the last 72 hours. CBG:  Recent Labs Lab 12/19/15 0854 12/19/15 1120 12/19/15 1652 12/19/15 2126 12/20/15 0802  GLUCAP 158* 160* 253* 206* 168*   Lipid Profile: No results for input(s): CHOL, HDL, LDLCALC, TRIG, CHOLHDL, LDLDIRECT in the last 72 hours. Thyroid Function Tests: No results for input(s): TSH, T4TOTAL, FREET4, T3FREE, THYROIDAB in the last 72 hours. Anemia Panel: No results for input(s): VITAMINB12, FOLATE, FERRITIN, TIBC, IRON, RETICCTPCT in the last 72 hours. Urine analysis:    Component Value Date/Time   COLORURINE YELLOW 04/24/2015 1056   APPEARANCEUR CLEAR 04/24/2015 1056   LABSPEC 1.013 04/24/2015 1056   PHURINE 5.0 04/24/2015 1056   GLUCOSEU NEGATIVE 04/24/2015 1056   HGBUR MODERATE* 04/24/2015 1056   BILIRUBINUR NEGATIVE 04/24/2015 1056   KETONESUR NEGATIVE 04/24/2015 1056   PROTEINUR NEGATIVE 04/24/2015 1056   UROBILINOGEN 0.2 04/24/2015 1056   NITRITE NEGATIVE 04/24/2015 1056   LEUKOCYTESUR NEGATIVE 04/24/2015 [place] M.D. [facility] 12/20/2015, 12:27 PM  Pager: [phone] Between 7am to 7pm - call Pager - [phone]  After 7pm go to [email or web address] - password [email or web address]  Call night coverage person covering after 7pm

## 2015-12-21 ENCOUNTER — Inpatient Hospital Stay: Payer: Self-pay | Admitting: Family Medicine

## 2015-12-21 LAB — CBC
HEMATOCRIT: 31.8 % — AB (ref 39.0–52.0)
HEMOGLOBIN: 10.1 g/dL — AB (ref 13.0–17.0)
MCH: 29.9 pg (ref 26.0–34.0)
MCHC: 31.8 g/dL (ref 30.0–36.0)
MCV: 94.1 fL (ref 78.0–100.0)
Platelets: 315 10*3/uL (ref 150–400)
RBC: 3.38 MIL/uL — AB (ref 4.22–5.81)
RDW: 13.3 % (ref 11.5–15.5)
WBC: 7.4 10*3/uL (ref 4.0–10.5)

## 2015-12-21 LAB — COMPREHENSIVE METABOLIC PANEL
ALK PHOS: 46 U/L (ref 38–126)
ALT: 16 U/L — AB (ref 17–63)
AST: 13 U/L — ABNORMAL LOW (ref 15–41)
Albumin: 3.2 g/dL — ABNORMAL LOW (ref 3.5–5.0)
Anion gap: 5 (ref 5–15)
BILIRUBIN TOTAL: 0.2 mg/dL — AB (ref 0.3–1.2)
BUN: 13 mg/dL (ref 6–20)
CALCIUM: 9.4 mg/dL (ref 8.9–10.3)
CO2: 26 mmol/L (ref 22–32)
Chloride: 107 mmol/L (ref 101–111)
Creatinine, Ser: 1.08 mg/dL (ref 0.61–1.24)
GLUCOSE: 215 mg/dL — AB (ref 65–99)
Potassium: 4.2 mmol/L (ref 3.5–5.1)
Sodium: 138 mmol/L (ref 135–145)
Total Protein: 6.4 g/dL — ABNORMAL LOW (ref 6.5–8.1)

## 2015-12-21 LAB — CULTURE, BLOOD (ROUTINE X 2)
CULTURE: NO GROWTH
CULTURE: NO GROWTH

## 2015-12-21 LAB — FOLATE: Folate: 16.4 ng/mL (ref >5.9)

## 2015-12-21 LAB — GLUCOSE, CAPILLARY
GLUCOSE-CAPILLARY: 191 mg/dL — AB (ref 65–99)
Glucose-Capillary: 214 mg/dL — ABNORMAL HIGH (ref 65–99)
Glucose-Capillary: 215 mg/dL — ABNORMAL HIGH (ref 65–99)
Glucose-Capillary: 197 mg/dL — ABNORMAL HIGH (ref 65–99)

## 2015-12-21 LAB — VITAMIN B12: Vitamin B-12: 343 pg/mL (ref 180–914)

## 2015-12-21 MED ORDER — LABETALOL HCL 5 MG/ML IV SOLN
10.0000 mg | Freq: Once | INTRAVENOUS | Status: AC
  Administered 2015-12-21: 10 mg via INTRAVENOUS
  Filled 2015-12-21: qty 4

## 2015-12-21 MED ORDER — PIPERACILLIN-TAZOBACTAM 3.375 G IVPB
3.3750 g | Freq: Three times a day (TID) | INTRAVENOUS | Status: DC
  Administered 2015-12-21 – 2015-12-22 (×2): 3.375 g via INTRAVENOUS
  Filled 2015-12-21 (×4): qty 50

## 2015-12-21 NOTE — Progress Notes (Signed)
[facility]                                                                              Patient Demographics  Shane [name], is a 45 y.o. male, DOB - 1970-11-14, [id number]  Admit date - 12/16/2015   Admitting Physician Rise Patience, MD  Outpatient Primary MD for the patient is Minerva Ends, MD  Outpatient specialists:   LOS - 5  days    Chief Complaint  Patient presents with  . foot infection        Brief summary   Shane [name] is a 45 y.o. male with diabetes mellitus type 2, hypertension, hyperlipidemia who was discharged on June 19 after being treated for left foot wound infection requiring second toe amputation and wound debridement and was again admitted and discharged 3 days ago for TIA presents to the ER for the second time with complaints of increasing swelling bloody discharge and numbness of the left foot for the last 3 days. Denies any weakness of upper or lower extremities. There is no foot drop. On exam there was mild discharge on the dressing on the left foot. Left foot looked swollen. Patient reported subjective feeling of fever chills. Patient has been admitted for further management. Patient states he did follow-up with orthopedic surgeon Dr. Randel Pigg office on Friday and had dressings changed. Patient has been empirically started on antibiotics. On arrival patient was hypotensive tachycardic which improved with IV fluid boluses for sepsis. Patient was admitted orthopedic was consulted who assessed the patient recommended wet-to-dry dressing changes as well as hydrotherapy. Patient to the OR today, 12/19/2015 for antibiotic be placement and wound VAC per orthopedics.   Assessment & Plan    Principal Problem:   SIRS (systemic inflammatory response syndrome) (HCC) - Likely secondary to left foot infection. MRI of the left foot with marked improvement in subcutaneous abscess on the dorsum of the foot. Residual abscess deep to the  incision.  - Orthopedics was consulted and patient underwent left foot dorsal excisional debridement, placement of antibiotic beads and irrigation wound VAC.  - Blood cultures negative so far. Continue IV vancomycin and Zosyn.   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 Residual abscess deep to the incision. - Underwent debridement and wound VAC placement on 7/4 - Follow cultures, continue IV antibiotics.   left lower extremity numbness, weakness: Per patient worsening today Patient complaining of numbness from foot to the calf region, per patient worsening after the surgery. He was recently admitted with TIA and with left-sided numbness and weakness and was discharged on 6/27. MRI of the brain 7/2 was negative for stroke. - MRI of the lumbar spine 7/5 showed mild disc degeneration at L4-5 with mild foraminal narrowing bilaterally, no disc protrusion or spinal stenosis - Patient had recent ABIs done 12/02/2015 which are within normal limits at rest.  - Discussed with neurology, will see patient for consult and recommendations.   hypertension Continue Norvasc. Coreg and lisinopril on hold.  diabetes mellitus type 2 Hemoglobin A1c is 8.3. CBGs have ranged  from 158-193. Hold oral hypoglycemic agents. Sliding scale insulin.   history of TIA Continue aspirin, statin   chronic anemia Follow H&H.   peripheral neuropathy Neurontin.   hyperlipidemia Continue statin.  Code Status: Full code DVT Prophylaxis:  SCD's Family Communication: Discussed in detail with the patient, all imaging results, lab results explained to the patient    Disposition Plan:   Time Spent in minutes   25 minutes  Procedures:  Excisional debridement, placement of antibiotic beads, wound VAC on 7/4  Consultants:   Orthopedics Neurology  Antimicrobials:   IV vancomycin  IV  Zosyn   Medications  Scheduled Meds: . amLODipine  5 mg Oral Daily  . aspirin  325 mg Oral Daily  . atorvastatin  80 mg Oral q1800  . divalproex  1,000 mg Oral QHS  . ferrous sulfate  325 mg Oral Q breakfast  . FLUoxetine  10 mg Oral Daily  . gabapentin  100 mg Oral Daily  . insulin aspart  0-9 Units Subcutaneous TID WC  . pantoprazole  40 mg Oral Daily  . piperacillin-tazobactam (ZOSYN)  IV  3.375 g Intravenous Q8H  . sodium chloride  250 mL Intravenous Once  . vancomycin  1,000 mg Intravenous Q12H   Continuous Infusions:  PRN Meds:.acetaminophen **OR** acetaminophen, metoCLOPramide **OR** metoCLOPramide (REGLAN) injection, ondansetron **OR** ondansetron (ZOFRAN) IV, oxyCODONE-acetaminophen, traZODone   Antibiotics   Anti-infectives    Start     Dose/Rate Route Frequency Ordered Stop   12/19/15 0956  gentamicin (GARAMYCIN) injection  Status:  Discontinued       As needed 12/19/15 0957 12/19/15 1110   12/19/15 0949  vancomycin (VANCOCIN) powder  Status:  Discontinued       As needed 12/19/15 0949 12/19/15 1110   12/17/15 0400  vancomycin (VANCOCIN) IVPB 1000 mg/200 mL premix     1,000 mg 200 mL/hr over 60 Minutes Intravenous Every 12 hours 12/16/15 1716     12/17/15 0000  piperacillin-tazobactam (ZOSYN) IVPB 3.375 g     3.375 g 12.5 mL/hr over 240 Minutes Intravenous Every 8 hours 12/16/15 1716     12/16/15 1600  vancomycin (VANCOCIN) 1,750 mg in sodium chloride 0.9 % 500 mL IVPB     1,750 mg 250 mL/hr over 120 Minutes Intravenous  Once 12/16/15 1550 12/16/15 1828   12/16/15 1600  piperacillin-tazobactam (ZOSYN) IVPB 3.375 g     3.375 g 12.5 mL/hr over 240 Minutes Intravenous  Once 12/16/15 1550 12/16/15 1827        Subjective:   Shane [name] was seen and examined today.  Containing of left leg numbness and weakness worsening today. Patient denies dizziness, chest pain, shortness of breath, abdominal pain, N/V/D/C.  Objective:   Filed Vitals:   12/20/15 0633  12/20/15 1420 12/20/15 2131 12/21/15 0530  BP: 155/89 135/84 164/90 135/79  Pulse: 77 84 90 77  Temp: 98 F (36.7 C) 98.2 F (36.8 C) 97.4 F (36.3 C) 97.8 F (36.6 C)  TempSrc: Oral     Resp: 18 17 18 18   Height:      Weight:      SpO2: 99% 99% 100% 97%    Intake/Output Summary (Last 24 hours) at 12/21/15 1200 Last data filed at 12/21/15 0900  Gross per 24 hour  Intake    740 ml  Output   1625 ml  Net   -885 ml     Wt Readings from Last 3 Encounters:  12/16/15 76.204 kg (168 lb)  12/11/15 79.4 kg (175  lb 0.7 oz)  12/01/15 77.1 kg (169 lb 15.6 oz)     Exam  General: Alert and oriented x 3, NAD  HEENT:  Neck:   Cardiovascular: S1 S2 auscultated, no rubs, murmurs or gallops. Regular rate and rhythm.  Respiratory: Clear to auscultation bilaterally, no wheezing, rales or rhonchi  Gastrointestinal: Soft, nontender, nondistended, + bowel sounds  Ext: no cyanosis clubbing. Left leg dressing intact with wound VAC  Neuro: Strength 5/5 in upper extremities, right lower extremity 5/5, left LE 2/5, able to lift up some however sensation decreased  Skin: No rashes  Psych: Normal affect and demeanor, alert and oriented x3    Data Reviewed:  I have personally reviewed following labs and imaging studies  Micro Results Recent Results (from the past 240 hour(s))  Blood Culture (routine x 2)     Status: None (Preliminary result)   Collection Time: 12/16/15  3:50 PM  Result Value Ref Range Status   Specimen Description BLOOD RIGHT ANTECUBITAL  Final   Special Requests BOTTLES DRAWN AEROBIC AND ANAEROBIC 5CC  Final   Culture NO GROWTH 4 DAYS  Final   Report Status PENDING  Incomplete  Blood Culture (routine x 2)     Status: None (Preliminary result)   Collection Time: 12/16/15  3:55 PM  Result Value Ref Range Status   Specimen Description BLOOD LEFT ANTECUBITAL  Final   Special Requests BOTTLES DRAWN AEROBIC ONLY 10CC  Final   Culture NO GROWTH 4 DAYS  Final   Report  Status PENDING  Incomplete  MRSA PCR Screening     Status: None   Collection Time: 12/18/15 10:43 PM  Result Value Ref Range Status   MRSA by PCR NEGATIVE NEGATIVE Final    Comment:        The GeneXpert MRSA Assay (FDA approved for NASAL specimens only), is one component of a comprehensive MRSA colonization surveillance program. It is not intended to diagnose MRSA infection nor to guide or monitor treatment for MRSA infections.   Surgical pcr screen     Status: None   Collection Time: 12/19/15  7:03 AM  Result Value Ref Range Status   MRSA, PCR NEGATIVE NEGATIVE Final   Staphylococcus aureus NEGATIVE NEGATIVE Final    Comment:        The Xpert SA Assay (FDA approved for NASAL specimens in patients over 65 years of age), is one component of a comprehensive surveillance program.  Test performance has been validated by [facility] for patients greater than or equal to 1 year old. It is not intended to diagnose infection nor to guide or monitor treatment.     Radiology Reports Dg Chest 2 View  12/16/2015  CLINICAL DATA:  Toe amputation recently.  Possible sepsis. EXAM: CHEST  2 VIEW COMPARISON:  12/11/2015 FINDINGS: Lungs are adequately inflated without focal consolidation, effusion or pneumothorax. Cardiomediastinal silhouette and remainder of the exam is unchanged. IMPRESSION: No acute disease. Electronically Signed   By: Marin Olp M.D.   On: 12/16/2015 18:15   Dg Chest 2 View  12/11/2015  CLINICAL DATA:  TIA EXAM: CHEST  2 VIEW COMPARISON:  12/01/2015 FINDINGS: Heart and mediastinal contours are within normal limits. No focal opacities or effusions. No acute bony abnormality. IMPRESSION: No active cardiopulmonary disease. Electronically Signed   By: Rolm Baptise M.D.   On: 12/11/2015 07:40   Dg Chest 2 View  12/01/2015  CLINICAL DATA:  Chest pain for couple of hours. EXAM: CHEST  2 VIEW COMPARISON:  08/26/2015 FINDINGS: The heart size and mediastinal contours are within  normal limits. Both lungs are clear. The visualized skeletal structures are unremarkable. IMPRESSION: No active cardiopulmonary disease. Electronically Signed   By: Lucienne Capers M.D.   On: 12/01/2015 01:37   Mr Brain Wo Contrast  12/11/2015  CLINICAL DATA:  Recurrent acute LEFT-sided numbness and weakness. Headache. EXAM: MRI HEAD WITHOUT CONTRAST TECHNIQUE: Multiplanar, multiecho pulse sequences of the brain and surrounding structures were obtained without intravenous contrast. COMPARISON:  None. FINDINGS: No evidence for acute infarction, hemorrhage, mass lesion, hydrocephalus, or extra-axial fluid. Normal cerebral volume. No significant white matter disease. Pituitary, pineal, and cerebellar tonsils unremarkable. No upper cervical lesions. Flow voids are maintained throughout the carotid, basilar, and vertebral arteries. There are no areas of chronic hemorrhage. Visualized calvarium, skull base, and upper cervical osseous structures unremarkable. Scalp and extracranial soft tissues, orbits, sinuses, and mastoids show no acute process. IMPRESSION: Negative exam.  No acute or focal intracranial findings. Electronically Signed   By: Staci Righter M.D.   On: 12/11/2015 13:34   Mr Lumbar Spine W Wo Contrast  12/20/2015  CLINICAL DATA:  Left foot wound with recent toe amputation. Diabetes, hypertension, hyperlipidemia. Left leg weakness EXAM: MRI LUMBAR SPINE WITHOUT AND WITH CONTRAST TECHNIQUE: Multiplanar and multiecho pulse sequences of the lumbar spine were obtained without and with intravenous contrast. CONTRAST:  67mL MULTIHANCE GADOBENATE DIMEGLUMINE 529 MG/ML IV SOLN COMPARISON:  None. FINDINGS: Segmentation:  Normal segmentation.  Lowest disc space L5-S1. Alignment:  Normal Vertebrae: Negative for fracture or mass. No evidence of spinal infection. Normal enhancement of the lumbar spine Conus medullaris: Extends to the L1 level and appears normal. Paraspinal and other soft tissues: Paraspinous muscles  normal. No retroperitoneal mass or fluid collection. Diffuse bladder wall thickening, question cystitis. Disc levels: L1-2:  Negative L2-3:  Negative L3-4:  Negative L4-5: Mild disc and mild facet degeneration. Mild foraminal narrowing bilaterally. No significant spinal stenosis L5-S1:  Negative IMPRESSION: Mild disc degeneration at L4-5 with mild foraminal narrowing bilaterally. No disc protrusion or spinal stenosis Bladder wall thickening, question cystitis Electronically Signed   By: Franchot Gallo M.D.   On: 12/20/2015 19:13   Mr Foot Left Wo Contrast  12/17/2015  CLINICAL DATA:  Cellulitis of the left foot. Drainage of an abscess on December 02, 2015. EXAM: MRI OF THE LEFT FOOT WITHOUT CONTRAST TECHNIQUE: Multiplanar, multisequence MR imaging was performed. No intravenous contrast was administered. COMPARISON:  Radiographs dated 12/16/2015 and MRI dated 12/01/2015 FINDINGS: There has been marked reduction in the size of the subcutaneous abscess on the dorsum of the left foot. The abscess now measures approximately 4.3 cm in width and 3.9 cm in length and approximately 6 mm in depth. This communicates with the soft tissue wound on the dorsum of the foot which is centered over the abscess. Marked reduction in the subcutaneous edema on the dorsum of the foot. There is no osteomyelitis or joint effusions.  No myositis. IMPRESSION: Marked improvement in the subcutaneous abscess on the dorsum of the foot. Residual abscess deep to the incision as described. Electronically Signed   By: Lorriane Shire M.D.   On: 12/17/2015 12:16   Mr Foot Left Wo Contrast  12/02/2015  CLINICAL DATA:  Left foot pain and left second toe swelling for 2 weeks. Fever and chills. Question abscess or osteomyelitis. EXAM: MRI OF THE LEFT FOREFOOT WITHOUT CONTRAST TECHNIQUE: Multiplanar, multisequence MR imaging was performed. No intravenous contrast was administered. COMPARISON:  Plain films left foot  11/30/2015. MRI left foot 08/02/2015.  FINDINGS: Amputation of the great toe is identified as seen on comparison plain films. Marked soft tissue edema is seen over the dorsum of the foot. A large area of heterogeneous signal abnormality is seen in the subcutaneous soft tissues over the dorsum of the foot. This area measures up to 4.4 cm transverse at the level of the distal metatarsals by 1.8 cm craniocaudal by 5.6 cm long. Smaller areas of similar signal abnormality are seen extending to the level of the mid foot. The coronal STIR sequence demonstrates mild appearing edema distal phalanx of the second toe but this cannot be confirmed on other sequences. Bone marrow signal is otherwise unremarkable. IMPRESSION: Intense edema over the dorsum of the foot is compatible with cellulitis. Heterogeneous areas of signal abnormality in the dorsum of the foot is worrisome for abscess. Mildly increased T2 signal in the coronal STIR sequence in the distal phalanx of the second toe cannot be confirmed on other sequences. No convincing evidence of osteomyelitis is identified. Electronically Signed   By: Inge Rise M.D.   On: 12/02/2015 08:43   Dg Foot 2 Views Left  11/22/2015  CLINICAL DATA:  Left foot pain/ swelling at site of great toe amputation EXAM: LEFT FOOT - 2 VIEW COMPARISON:  08/13/2015 FINDINGS: Status post left 1st digit amputation at the MTP joint. No fracture or dislocation is seen. The joint spaces are preserved. Mild soft tissue swelling overlying the 1st digit. No radiopaque foreign body is seen. IMPRESSION: Mild soft tissue swelling at the site of 1st digit amputation. No radiopaque foreign body is seen. No fracture or dislocation is seen. Electronically Signed   By: Julian Hy M.D.   On: 11/22/2015 09:36   Dg Foot Complete Left  12/16/2015  CLINICAL DATA:  Left foot numbness.  Rule out osteomyelitis EXAM: LEFT FOOT - COMPLETE 3+ VIEW COMPARISON:  12/14/2015 FINDINGS: Amputation of the great toe at the level of the MTP joint.  Amputation of the second toe at the level of the distal aspect of the proximal phalanx. Negative for osteomyelitis. No fracture or arthropathy. No change from the recent study. Arterial calcification. Wound on the dorsum of the foot. IMPRESSION: No interval change.  Negative for osteomyelitis. Electronically Signed   By: Franchot Gallo M.D.   On: 12/16/2015 19:11   Dg Foot Complete Left  12/14/2015  CLINICAL DATA:  Awoke with complete numbness LEFT foot, no feeling, no pain, surgery 1 week ago with partial amputation of second toe and I & D of infected area, swelling, history type II diabetes mellitus, CHF, hyperlipidemia, hypertension EXAM: LEFT FOOT - COMPLETE 3+ VIEW COMPARISON:  11/30/2015 FINDINGS: Osseous mineralization normal. Prior amputation great toe. Interval amputation second toe through head of proximal phalanx. Soft tissue swelling of forefoot and second toe stump. No acute fracture, dislocation, or bone destruction. Scattered small vessel vascular calcification. IMPRESSION: Interval amputation of second toe through head of proximal phalanx. Prior amputation great toe. No acute osseous abnormalities. Electronically Signed   By: Lavonia Dana M.D.   On: 12/14/2015 15:15   Dg Foot Complete Left  12/01/2015  CLINICAL DATA:  Left foot wound. Diabetes. Second toe nail fell off 5 days ago. EXAM: LEFT FOOT - COMPLETE 3+ VIEW COMPARISON:  11/21/2015 FINDINGS: Previous amputation of the left first toe at the metatarsal-phalangeal joint level. Soft tissues at the site of resection or prominent but unchanged since previous study. No radiopaque soft tissue foreign bodies or gas collections. Vascular  calcifications. No bone erosion or sclerosis to suggest osteomyelitis. No acute fracture or dislocation. IMPRESSION: Previous amputation of the left first toe. No evidence of osteomyelitis. Electronically Signed   By: Lucienne Capers M.D.   On: 12/01/2015 00:27   Ct Head Code Stroke W/o Cm  12/10/2015  CLINICAL  DATA:  45 year old male with code stroke. EXAM: CT HEAD WITHOUT CONTRAST TECHNIQUE: Contiguous axial images were obtained from the base of the skull through the vertex without intravenous contrast. COMPARISON:  Head CT dated 08/26/2015 and MRI dated 08/26/2015 FINDINGS: The ventricles and the sulci are appropriate in size for the patient's age. There is no intracranial hemorrhage. No midline shift or mass effect identified. The gray-white matter differentiation is preserved. The visualized paranasal sinuses and mastoid air cells are well aerated. The calvarium is intact. IMPRESSION: No acute intracranial pathology. These results were called by telephone at the time of interpretation on 12/10/2015 at 9:08 pm to Dr. Nicole Kindred, who verbally acknowledged these results. Electronically Signed   By: Anner Crete M.D.   On: 12/10/2015 21:16    Lab Data:  CBC:  Recent Labs Lab 12/16/15 1515 12/17/15 0405 12/18/15 0454 12/19/15 0539 12/20/15 0531 12/21/15 0728  WBC 7.4 9.5 7.6 7.8 8.0 7.4  NEUTROABS 5.0  --   --   --   --   --   HGB 10.7* 10.3* 9.8* 10.3* 10.3* 10.1*  HCT 33.9* 32.5* 30.3* 32.2* 32.6* 31.8*  MCV 95.5 95.3 93.5 93.9 96.2 94.1  PLT 442* 411* 355 384 340 [id number]   Basic Metabolic Panel:  Recent Labs Lab 12/17/15 0405 12/18/15 0454 12/19/15 0539 12/20/15 0531 12/21/15 0728  NA 140 145 140 137 138  K 4.7 3.9 4.1 4.1 4.2  CL 108 100* 105 103 107  CO2 26 27 28 27 26   GLUCOSE 199* 156* 142* 206* 215*  BUN 15 12 14 15 13   CREATININE 0.99 1.02 1.09 1.10 1.08  CALCIUM 9.2 10.1 9.3 9.0 9.4   GFR: Estimated Creatinine Clearance: 82.7 mL/min (by C-G formula based on Cr of 1.08). Liver Function Tests:  Recent Labs Lab 12/16/15 1515 12/21/15 0728  AST 20 13*  ALT 24 16*  ALKPHOS 48 46  BILITOT 0.4 0.2*  PROT 7.2 6.4*  ALBUMIN 3.7 3.2*   No results for input(s): LIPASE, AMYLASE in the last 168 hours. No results for input(s): AMMONIA in the last 168 hours. Coagulation  Profile: No results for input(s): INR, PROTIME in the last 168 hours. Cardiac Enzymes: No results for input(s): CKTOTAL, CKMB, CKMBINDEX, TROPONINI in the last 168 hours. BNP (last 3 results) No results for input(s): PROBNP in the last 8760 hours. HbA1C: No results for input(s): HGBA1C in the last 72 hours. CBG:  Recent Labs Lab 12/20/15 0802 12/20/15 1211 12/20/15 1722 12/20/15 2137 12/21/15 0744  GLUCAP 168* 242* 153* 258* 214*   Lipid Profile: No results for input(s): CHOL, HDL, LDLCALC, TRIG, CHOLHDL, LDLDIRECT in the last 72 hours. Thyroid Function Tests: No results for input(s): TSH, T4TOTAL, FREET4, T3FREE, THYROIDAB in the last 72 hours. Anemia Panel:  Recent Labs  12/21/15 0728 12/21/15 0729  VITAMINB12 343  --   FOLATE  --  16.4   Urine analysis:    Component Value Date/Time   COLORURINE YELLOW 04/24/2015 [place] 04/24/2015 1056   LABSPEC 1.013 04/24/2015 1056   PHURINE 5.0 04/24/2015 1056   GLUCOSEU NEGATIVE 04/24/2015 1056   HGBUR MODERATE* 04/24/2015 1056   [place] 04/24/2015 1056  KETONESUR NEGATIVE 04/24/2015 1056   PROTEINUR NEGATIVE 04/24/2015 1056   UROBILINOGEN 0.2 04/24/2015 1056   NITRITE NEGATIVE 04/24/2015 1056   LEUKOCYTESUR NEGATIVE 04/24/2015 [place] M.D. [facility] 12/21/2015, 12:00 PM  Pager: [phone] Between 7am to 7pm - call Pager - [phone]  After 7pm go to [email or web address] - password [email or web address]  Call night coverage person covering after 7pm

## 2015-12-21 NOTE — Progress Notes (Signed)
Inpatient Diabetes Program Recommendations  AACE/ADA: New Consensus Statement on Inpatient Glycemic Control (2015)  Target Ranges:  Prepandial:   less than 140 mg/dL      Peak postprandial:   less than 180 mg/dL (1-2 hours)      Critically ill patients:  140 - 180 mg/dL   Lab Results  Component Value Date   GLUCAP 214* 12/21/2015   HGBA1C 8.3* 12/11/2015   Results for TIJON, ASEL (MRN [id number]) as of 12/21/2015 12:11  Ref. Range 12/20/2015 12:11 12/20/2015 17:22 12/20/2015 21:37 12/21/2015 07:44  Glucose-Capillary Latest Ref Range: 65-99 mg/dL 242 (H) 153 (H) 258 (H) 214 (H)   Review of Glycemic Control  Blood sugars in 200s. May benefit from low dose basal insulin.  Inpatient Diabetes Program Recommendations:    Add Lantus 10 units QHS  Will continue to follow. Thank you. Lorenda Peck, RD, LDN, CDE Inpatient Diabetes Coordinator [phone]

## 2015-12-21 NOTE — Progress Notes (Signed)
l sp mri neg Ok for dc from ortho standpoint Needs wound vac until next Thursday F/u with me next thursday

## 2015-12-21 NOTE — Discharge Instructions (Signed)
Weight bearing as tolerated with assist Wound vac until follow up next week

## 2015-12-21 NOTE — Hospital Discharge Follow-Up (Signed)
Transitional Care Clinic Care Coordination Note:  Admit date: 12/16/15 Discharge date: TBD Discharge Disposition: Home when stable Patient contact: [phone] (mobile) Emergency contact(s): Zohar Maroney (spouse)-#[phone]  This Case Manager reviewed patient's EMR and determined patient would benefit from post-discharge medical management and chronic care management services through the [facility]. Patient has a history of diabetes mellitus type 2, HTN, anemia, peripheral neuropathy. 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 Patient has had 4 inpatient admissions and 7 ED visits in the last six months. This Case Manager met with patient to discuss the services and medical management that can be provided at the [facility]. Patient verbalized understanding and agreed to receive post-discharge care at the [facility].   Patient scheduled for Transitional Care appointment on 12/28/15 at 1115 with Dr. Jarold Song.  Clinic information and appointment time provided to patient. Appointment information also placed on AVS.  Assessment:       Home Environment: Patient lives with his spouse in a private residence.       Support System: Spouse       Level of functioning: Independent       Home DME: none though patient indicates he has glucometer and diabetes testing supplies.        Home care services: none       Transportation: Patient indicates his spouse drives him to his medical appointments. No transportation barriers noted.        Food/Nutrition: Patient states he has access to needed food.        Medications: Patient gets his medications from the pharmacy at [facility]. Patient denies problems obtaining or affording needed medications.        Identified Barriers: uninsured-Patient indicates he has applied for Medicaid and application pending. Patient also  indicates he has met with Development worker, community at [place] but had not heard if eligible for the [employer] or [employer]. This Case Manager will discuss with Development worker, community and follow-up with patient.        PCP: Dr. Lenard [facility] [place]            Arranged services:        Services communicated to Whitman Hero, RN CM

## 2015-12-21 NOTE — Progress Notes (Signed)
Physical Therapy Evaluation Patient Details Name: Shane [name] MRN: [id number] DOB: 1970-12-09 Today's Date: 12/21/2015   History of Present Illness  Pt is a 45 y/o M s/p [name]. Pt's PMH includes Lt great toe amputation, TIA, anemia, depression, anxiety, bipolar disorder.  Pt returns to ED with reports of pain and numbness in the L foot.  pt s/p I and D for infection with antibiotic beeds added.  Pt reports since then that numbness has progressed up his leg to hip with associated inability to move his leg.  Clinical Impression  Pt admitted with/for L leg wound infection and leg numbness and progressive weakness.  Pt currently limited functionally due to the problems listed below.  (see problems list.)  Pt will benefit from PT to maximize function and safety to be able to get home safely with available assist .     Follow Up Recommendations Home health PT;Other (comment);Supervision/Assistance - 24 hour (vs SNF if not )    Equipment Recommendations  None recommended by PT    Recommendations for Other Services       Precautions / Restrictions Precautions Precautions: Fall      Mobility  Bed Mobility Overal bed mobility: Needs Assistance Bed Mobility: Supine to Sit;Sit to Supine     Supine to sit: Supervision Sit to supine: Min assist   General bed mobility comments: doesn't voluntarily move his L LE, but sometimes notice moving automatically  Transfers Overall transfer level: Needs assistance Equipment used: Rolling walker (2 wheeled) Transfers: Sit to/from Stand Sit to Stand: Min assist         General transfer comment: stability  and assist to come forward  Ambulation/Gait             General Gait Details: pregait activity in RW with pt stepping forward and back with R lower ext and automatically accepting weight on L LE.  Less movement to step forw/back with L LE  Stairs            Wheelchair Mobility    Modified Rankin (Stroke  Patients Only)       Balance     Sitting balance-Leahy Scale: Good       Standing balance-Leahy Scale: Poor                               Pertinent Vitals/Pain Pain Assessment: No/denies pain (but says L leg is numb)    Home Living Family/patient expects to be discharged to:: Private residence Living Arrangements: Alone;Spouse/significant other Available Help at Discharge: Family;Available 24 hours/day Type of Home: House Home Access: Stairs to enter Entrance Stairs-Rails: Right Entrance Stairs-Number of Steps: 3 Home Layout: One level Home Equipment: Walker - 2 wheels;Cane - single point      Prior Function Level of Independence: Needs assistance   Gait / Transfers Assistance Needed: uses RW due to limited WB on L LE  ADL's / Homemaking Assistance Needed: wife sets out clothes and set up bath but pt can dress and bath self in sitting        Hand Dominance   Dominant Hand: Right    Extremity/Trunk Assessment   Upper Extremity Assessment: Defer to OT evaluation (both UE's test weak but are functional)           Lower Extremity Assessment: RLE deficits/detail;LLE deficits/detail RLE Deficits / Details: tests mildly weak , but is functional LLE Deficits / Details: Initially resistance to flexion/ext.  Later pt's L LE bore weight in standing and moved when asked to put on his sock, but does not move voluntarily     Communication   Communication: No difficulties  Cognition Arousal/Alertness: Awake/alert Behavior During Therapy: Flat affect;WFL for tasks assessed/performed Overall Cognitive Status: Within Functional Limits for tasks assessed                      General Comments      Exercises        Assessment/Plan    PT Assessment Patient needs continued PT services  PT Diagnosis Difficulty walking;Generalized weakness   PT Problem List Decreased strength;Decreased activity tolerance;Decreased balance;Decreased  coordination;Decreased knowledge of use of DME;Decreased safety awareness;Impaired sensation  PT Treatment Interventions DME instruction;Gait training;Functional mobility training;Therapeutic activities;Balance training;Patient/family education   PT Goals (Current goals can be found in the Care Plan section) Acute Rehab PT Goals Patient Stated Goal: none stated PT Goal Formulation: With patient Time For Goal Achievement: 01/04/16 Potential to Achieve Goals: Good    Frequency Min 3X/week   Barriers to discharge        Co-evaluation               End of Session   Activity Tolerance: Patient tolerated treatment well Patient left: in bed;with call bell/phone within reach;with family/visitor present Nurse Communication: Mobility status         Time: [id number] PT Time Calculation (min) (ACUTE ONLY): 36 min   Charges:   PT Evaluation $PT Eval Moderate Complexity: 1 Procedure PT Treatments $Therapeutic Activity: 8-22 mins   PT G Codes:        Nadalee Neiswender, Tessie Fass 12/21/2015, 3:05 PM 12/21/2015  Donnella Sham, [place] [phone]  (pager)

## 2015-12-21 NOTE — Consult Note (Signed)
Neurology Consult Note  Reason for Consultation: LLE numbness  Requesting provider: Ripudeep Rai  CC: "My leg is numb"  HPI: This is a 45-yo RH man who was admitted on 12/16/15 for left foot numbness and swelling in setting of known diabetic wound infection. He underwent I and D of the wound with amputation of the left second toe on 12/02/15. He reports that immediately after this surgery he noted that he had numbness in his left foot to roughly the level of his ankle which has persisted. On 12/10/15, he presented to the ED complaining of weakness and numbness involving the left side. At that time, he was seen by Dr. Wallie Char, neurology, for consultation. According to Dr. Les Pou note, the patient reported headache with sensory symptoms occurring intermittently since 12/09/15. He was noted to have decreased light touch on the left side and a mild left pronator drift but no other deficits. He was admitted for evaluation of possible TIA/stroke. Workup included carotid Dopplers which showed no significant stenosis. TTE showed EF 0000000, grade 2 diastolic dysfunction, and normal wall motion and valves. MRI brain showed no acute ischemia. He was discharged on 12/12/15 with a diagnosis of TIA.   On 7/1, he returned to the ED c/o increased swelling and discharge from his left foot. He as found to have SIRS and admitted for management of his foot infection. He was placed on antibiotics with local wound care. PT Wound Treatment notes indicate that the patient had been reporting numbness in the RLE (? Side error) from the knee down. MRI of the foot was obtained and showed possible recurrent infection deep in his foot. He was taken to the OR by ortho on 7/4 for excisional debridement, placement of antibiotic beads, and a wound VAC.   The patient tells me today that his left foot was initially numb to the level of the ankle but immediately after his surgery on 7/4 the numbness extended up to the level of the knee  in a circumferential manner. He states that on the evening of 7/4, he was in bed and tried to get up and get his urinal from a table when he fell. Per RN notes at the time of this event, however, they indicate that he was reaching for his urinal and hit his left arm against the table. He reported to the RN at the time of the incident that his arm was sore and was given his PRN oxycodone. He tells me today, though, that his left leg numbness moved up to his hip after that episode. It has been unchanged since and he says that he has absolutely no sensation in the LLE below the hip. This does not involved the genitals or the buttock. In addition, he tells me that he can no longer move the leg, either. Neurology is now consulted for recommendations.   PMH:  Past Medical History  Diagnosis Date  . Hypertension     a. 08/2014 Admitted with hypertensive urgency.  . Chest pain     a. 2015 Reportedly normal stress test in [place].  Marland Kitchen TIA (transient ischemic attack) 08/2014; 03/2015    a. 08/2014 in setting of hypertensive urgency.  . Anemia   . Refusal of blood transfusions as patient is Jehovah's Witness   . Hyperlipidemia   . Chronic diastolic CHF (congestive heart failure) (HCC)     a.03/2015 Echo: EF 55-60%, Gr 1 DD, mild MR, triv PR.  . Type II diabetes mellitus ([place])   . Sleep  apnea   . GERD (gastroesophageal reflux disease)   . Anxiety   . Depression   . Bipolar disorder ([place])     PSH:  Past Surgical History  Procedure Laterality Date  . Inguinal hernia repair Bilateral ~ 1983- ~ 1986  . Tonsillectomy  ~ 1985  . Circumcision    . Amputation Left 08/03/2015    Procedure: AMPUTATION LEFT GREAT TOE;  Surgeon: Newt Minion, MD;  Location: [place];  Service: Orthopedics;  Laterality: Left;  . I&d extremity Left 12/02/2015    Procedure: IRRIGATION AND DEBRIDEMENT OF FOOT; LEFT SECOND TOE AMPUTATION;  Surgeon: Meredith Pel, MD;  Location: [place];  Service: Orthopedics;  Laterality: Left;  .  Amputation Left 12/02/2015    Procedure: amputation of left 2nd digit  . I&d extremity Left 12/19/2015    Procedure: I & D LEFT FOOT WITH BEADS ;  Surgeon: Meredith Pel, MD;  Location: [place];  Service: Orthopedics;  Laterality: Left;  . Application of wound vac Left 12/19/2015    Procedure: APPLICATION OF WOUND VAC;  Surgeon: Meredith Pel, MD;  Location: [place];  Service: Orthopedics;  Laterality: Left;    Family history: Family History  Problem Relation Age of Onset  . Hypertension Mother   . Diabetes Mother   . Hyperlipidemia Mother   . Heart disease Mother     s/p pacemaker  . Diabetes Father   . Hypertension Father   . Stroke Father   . Heart attack Father     first MI @ 53.  . Stroke Brother     Social history:  Social History   Social History  . Marital Status: Married    Spouse Name: N/A  . Number of Children: N/A  . Years of Education: N/A   Occupational History  . Not on file.   Social History Main Topics  . Smoking status: Never Smoker   . Smokeless tobacco: Never Used  . Alcohol Use: No  . Drug Use: No  . Sexual Activity: No   Other Topics Concern  . Not on file   Social History Narrative   Lives in [place] with wife.  Active but doesn't routinely exercise.    Current outpatient meds: Current Meds  Medication Sig  . amLODipine (NORVASC) 5 MG tablet Take 1 tablet (5 mg total) by mouth daily.  Marland Kitchen amoxicillin-clavulanate (AUGMENTIN) 875-125 MG tablet Take 1 tablet by mouth 2 (two) times daily.  Marland Kitchen aspirin EC 325 MG tablet Take 1 tablet (325 mg total) by mouth daily. (Patient taking differently: Take 325 mg by mouth every morning. )  . atorvastatin (LIPITOR) 80 MG tablet Take 1 tablet (80 mg total) by mouth every morning.  . carvedilol (COREG) 12.5 MG tablet TAKE 1 TABLET BY MOUTH 2 TIMES DAILY WITH A MEAL (Patient taking differently: TAKE 1 TABLET BY MOUTH IN THE MORNING.)  . divalproex (DEPAKOTE) 500 MG DR tablet Take 2 tablets (1,000 mg total) by  mouth at bedtime.  . ferrous sulfate 325 (65 FE) MG tablet Take 1 tablet (325 mg total) by mouth 2 (two) times daily with a meal. (Patient taking differently: Take 325 mg by mouth daily with breakfast. )  . FLUoxetine (PROZAC) 10 MG tablet Take 10 mg by mouth daily.  Marland Kitchen gabapentin (NEURONTIN) 100 MG capsule Take 1 capsule (100 mg total) by mouth 3 (three) times daily. (Patient taking differently: Take 100 mg by mouth daily. )  . glipiZIDE (GLUCOTROL) 10 MG tablet Take  1 tablet (10 mg total) by mouth 2 (two) times daily before a meal. (Patient taking differently: Take 10 mg by mouth daily before breakfast. )  . lisinopril (PRINIVIL,ZESTRIL) 40 MG tablet Take 1 tablet (40 mg total) by mouth daily.  . metFORMIN (GLUCOPHAGE) 1000 MG tablet Take 1 tablet (1,000 mg total) by mouth 2 (two) times daily with a meal. (Patient taking differently: Take 1,000 mg by mouth daily with breakfast. )  . omeprazole (PRILOSEC) 20 MG capsule Take 1 capsule (20 mg total) by mouth daily.  Marland Kitchen oxyCODONE-acetaminophen (PERCOCET/ROXICET) 5-325 MG tablet Take 2 tablets by mouth every 6 (six) hours as needed for severe pain. (Patient taking differently: Take 1 tablet by mouth daily as needed for moderate pain or severe pain. )  . sulfamethoxazole-trimethoprim (BACTRIM DS,SEPTRA DS) 800-160 MG tablet Take 1 tablet by mouth 2 (two) times daily.  . traZODone (DESYREL) 50 MG tablet Take 0.5-1 tablets (25-50 mg total) by mouth at bedtime as needed for sleep.    Current inpatient meds:  Current Facility-Administered Medications  Medication Dose Route Frequency Provider Last Rate Last Dose  . acetaminophen (TYLENOL) tablet 650 mg  650 mg Oral Q6H PRN Meredith Pel, MD       Or  . acetaminophen (TYLENOL) suppository 650 mg  650 mg Rectal Q6H PRN Meredith Pel, MD      . amLODipine (NORVASC) tablet 5 mg  5 mg Oral Daily Eugenie Filler, MD   5 mg at 12/21/15 0818  . aspirin tablet 325 mg  325 mg Oral Daily Meredith Pel, MD   325 mg at 12/21/15 0817  . atorvastatin (LIPITOR) tablet 80 mg  80 mg Oral q1800 Rise Patience, MD   80 mg at 12/20/15 1752  . divalproex (DEPAKOTE) DR tablet 1,000 mg  1,000 mg Oral QHS Rise Patience, MD   1,000 mg at 12/20/15 2142  . ferrous sulfate tablet 325 mg  325 mg Oral Q breakfast Rise Patience, MD   325 mg at 12/21/15 0817  . FLUoxetine (PROZAC) capsule 10 mg  10 mg Oral Daily Rise Patience, MD   10 mg at 12/21/15 0817  . gabapentin (NEURONTIN) capsule 100 mg  100 mg Oral Daily Rise Patience, MD   100 mg at 12/21/15 0818  . insulin aspart (novoLOG) injection 0-9 Units  0-9 Units Subcutaneous TID WC Rise Patience, MD   3 Units at 12/21/15 [phone]  . metoCLOPramide (REGLAN) tablet 5-10 mg  5-10 mg Oral Q8H PRN Meredith Pel, MD       Or  . metoCLOPramide (REGLAN) injection 5-10 mg  5-10 mg Intravenous Q8H PRN Meredith Pel, MD      . ondansetron [facility]) tablet 4 mg  4 mg Oral Q6H PRN Meredith Pel, MD       Or  . ondansetron [facility]) injection 4 mg  4 mg Intravenous Q6H PRN Meredith Pel, MD      . oxyCODONE-acetaminophen (PERCOCET/ROXICET) 5-325 MG per tablet 1 tablet  1 tablet Oral Daily PRN Rise Patience, MD   1 tablet at 12/20/15 0031  . pantoprazole (PROTONIX) EC tablet 40 mg  40 mg Oral Daily Rise Patience, MD   40 mg at 12/21/15 0817  . piperacillin-tazobactam (ZOSYN) IVPB 3.375 g  3.375 g Intravenous Q8H Wynell Balloon, RPH   3.375 g at 12/21/15 0818  . sodium chloride 0.9 % bolus 250 mL  250 mL Intravenous  Once Shary Decamp, PA-C   250 mL at 12/16/15 1710  . traZODone (DESYREL) tablet 25-50 mg  25-50 mg Oral QHS PRN Rise Patience, MD   50 mg at 12/21/15 0017  . vancomycin (VANCOCIN) IVPB 1000 mg/200 mL premix  1,000 mg Intravenous Q12H Wynell Balloon, RPH   1,000 mg at 12/21/15 0414    Allergies: Allergies  Allergen Reactions  . Eggs Or Egg-Derived Products Diarrhea  . Other Other (See  Comments)    Red meat causes stomach pains, bloating and diarrhea  . Milk-Related Compounds Diarrhea and Other (See Comments)    Any dairy products  - diarrhea and bloating    ROS: As per HPI. A full 14-point review of systems was performed and is otherwise unremarkable.   PE:  BP 135/79 mmHg  Pulse 77  Temp(Src) 97.8 F (36.6 C) (Oral)  Resp 18  Ht 5\' 8"  (1.727 m)  Wt 76.204 kg (168 lb)  BMI 25.55 kg/m2  SpO2 97%  General: WDWN, no acute distress. AAO x4. Speech clear, no dysarthria. No aphasia. Follows commands briskly. Affect is mildly restricted. Comportment is normal.  HEENT: Normocephalic. Neck supple without LAD. MMM, OP clear. Dentition good. Sclerae anicteric. No conjunctival injection.  CV: Regular, no murmur. Carotid pulses full and symmetric, no bruits. Distal pulses 2+ on R. Dressing with wound vac in place on L foot.  Lungs: CTAB.  Abdomen: Soft, non-distended, non-tender. Bowel sounds present x4.  Extremities: Mottled hyperpigmentation over BLE, L>R Neuro:  CN: Pupils are equal and round. They are symmetrically reactive from 3-->2 mm. EOMI without nystagmus. No reported diplopia. Facial sensation is intact to light touch. Face is symmetric at rest with normal strength an mobility. Hearing is intact to conversational voice. Palate elevates symmetrically and uvula is midline. Voice is normal in tone, pitch and quality. Bilateral SCM and trapezii are 5/5. Tongue is midline with normal bulk and mobility.  Motor: Normal bulk, tone, and strength with the exception of the LLE. He does not move the LLE to command during confrontational testing. With passive movement of the LLE, however, he actively resists me. He has a positive Hoover's on the L. He has strong L hip extension when raising the RLE off the bed. No tremor or other abnormal movements. No drift.  DTRs: 2+ in BUE, 1+ B knees, absent R ankle. L ankle not tested.  Sensation: intact to light touch and pinprick throughout  except for the LLE. He indicates absence of light touch and pinprick circumferentially from the foot to the inguinal crease anteriorly and to the gluteal fold posteriorly.  Coordination: Finger-to-nose is without dysmetria. Finger taps are normal in amplitude and speed, no decrement.  Gait: Deferred due to L foot wound VAC.    Labs:  Lab Results  Component Value Date   WBC 7.4 12/21/2015   HGB 10.1* 12/21/2015   HCT 31.8* 12/21/2015   PLT 315 12/21/2015   GLUCOSE 215* 12/21/2015   CHOL 107 12/11/2015   TRIG 129 12/11/2015   HDL 23* 12/11/2015   LDLCALC 58 12/11/2015   ALT 16* 12/21/2015   AST 13* 12/21/2015   NA 138 12/21/2015   K 4.2 12/21/2015   CL 107 12/21/2015   CREATININE 1.08 12/21/2015   BUN 13 12/21/2015   CO2 26 12/21/2015   INR 1.13 12/10/2015   HGBA1C 8.3* 12/11/2015   MICROALBUR 9.4 04/07/2015    Imaging: I have personally and independently reviewed the MRI of the brain without contrast from  12/11/15. This is unremarkable.   I have reviewed the MRI of the lumbar spine with and without contrast from 12/20/15. This reveals slight degenerative disk disease at L4-5.    Assessment and Plan:  1. LLE numbness: His pattern of numbness on today's exam in non-physiologic, as is his LLE weakness. While he almost certainly has diabetic polyneuropathy, this would not explain his complaint. This is most consistent with possible conversion disorder. MRI of the L spine is unrevealing. At this point, I shared with the patient that his symptoms do not indicate a direct neurologic pathology. Recommend PT to evaluate and treat when able from ortho perspective. Ensure tight glycemic control to minimize further peripheral nerve injury.   This was discussed with the patient. He is in agreement with the plan as stated and was given the opportunity to ask any questions. These were addressed to his satisfaction.   Melba Coon, M.D. Neurology

## 2015-12-22 ENCOUNTER — Inpatient Hospital Stay (HOSPITAL_COMMUNITY): Payer: Medicaid Other

## 2015-12-22 ENCOUNTER — Encounter (HOSPITAL_COMMUNITY): Payer: Self-pay

## 2015-12-22 DIAGNOSIS — I82402 Acute embolism and thrombosis of unspecified deep veins of left lower extremity: Secondary | ICD-10-CM

## 2015-12-22 LAB — GLUCOSE, CAPILLARY
GLUCOSE-CAPILLARY: 251 mg/dL — AB (ref 65–99)
Glucose-Capillary: 219 mg/dL — ABNORMAL HIGH (ref 65–99)
Glucose-Capillary: 288 mg/dL — ABNORMAL HIGH (ref 65–99)
Glucose-Capillary: 180 mg/dL — ABNORMAL HIGH (ref 65–99)

## 2015-12-22 MED ORDER — OXYCODONE-ACETAMINOPHEN 5-325 MG PO TABS: 1.0000 | ORAL_TABLET | Freq: Four times a day (QID) | ORAL | Status: DC | PRN

## 2015-12-22 MED ORDER — METFORMIN HCL 500 MG PO TABS
1000.0000 mg | ORAL_TABLET | Freq: Two times a day (BID) | ORAL | Status: DC
Start: 2015-12-22 — End: 2015-12-23
  Administered 2015-12-22 – 2015-12-23 (×3): 1000 mg via ORAL
  Filled 2015-12-22 (×3): qty 2

## 2015-12-22 MED ORDER — DOXYCYCLINE HYCLATE 100 MG PO TABS
100.0000 mg | ORAL_TABLET | Freq: Two times a day (BID) | ORAL | Status: DC
  Administered 2015-12-22 – 2015-12-23 (×3): 100 mg via ORAL
  Filled 2015-12-22 (×3): qty 1

## 2015-12-22 MED ORDER — DOXYCYCLINE HYCLATE 100 MG PO TABS: 100.0000 mg | ORAL_TABLET | Freq: Two times a day (BID) | ORAL | Status: DC

## 2015-12-22 MED ORDER — METFORMIN HCL 1000 MG PO TABS: 1000.0000 mg | ORAL_TABLET | Freq: Two times a day (BID) | ORAL | Status: DC

## 2015-12-22 NOTE — Progress Notes (Signed)
[facility]                                                                              Patient Demographics  Shane [name], is a 45 y.o. male, DOB - March 14, 1971, [id number]  Admit date - 12/16/2015   Admitting Physician Rise Patience, MD  Outpatient Primary MD for the patient is Minerva Ends, MD  Outpatient specialists:   LOS - 6  days    Chief Complaint  Patient presents with  . foot infection        Brief summary   Shane [name] is a 45 y.o. male with diabetes mellitus type 2, hypertension, hyperlipidemia who was discharged on June 19 after being treated for left foot wound infection requiring second toe amputation and wound debridement and was again admitted and discharged 3 days ago for TIA presents to the ER for the second time with complaints of increasing swelling bloody discharge and numbness of the left foot for the last 3 days. Denies any weakness of upper or lower extremities. There is no foot drop. On exam there was mild discharge on the dressing on the left foot. Left foot looked swollen. Patient reported subjective feeling of fever chills. Patient has been admitted for further management. Patient states he did follow-up with orthopedic surgeon Dr. Randel Pigg office on Friday and had dressings changed. Patient has been empirically started on antibiotics. On arrival patient was hypotensive tachycardic which improved with IV fluid boluses for sepsis. Patient was admitted orthopedic was consulted who assessed the patient recommended wet-to-dry dressing changes as well as hydrotherapy. Patient to the OR today, 12/19/2015 for antibiotic be placement and wound VAC per orthopedics.   Assessment & Plan    Principal Problem:   SIRS (systemic inflammatory response syndrome) (HCC) - Likely secondary to left foot infection. MRI of the left foot with marked improvement in subcutaneous abscess on the dorsum of the foot. Residual abscess deep to the  incision.  - Orthopedics was consulted and patient underwent left foot dorsal excisional debridement, placement of antibiotic beads and irrigation wound VAC.  - Blood cultures negative so far. Continue IV vancomycin and Zosyn. Will change to oral doxycycline upon dc.    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 Residual abscess deep to the incision. - Underwent debridement and wound VAC placement on 7/4 - Blood cultures negative so far, continue IV antibiotics. - Patient requesting second opinion for the left foot wound and requested for Dr. Sharol Given. D/w Dr Sharol Given who will see patient later today   left lower extremity numbness, weakness: Per patient worsening today Patient complaining of numbness from foot to the calf region, per patient worsening after the surgery. He was recently admitted with TIA and with left-sided numbness and weakness and was discharged on 6/27. MRI of the brain 7/2 was negative for stroke. - MRI of the lumbar spine 7/5 showed mild disc degeneration at L4-5 with mild foraminal narrowing bilaterally, no disc protrusion or spinal stenosis - Patient had recent ABIs done 12/02/2015 which are within normal limits at  rest.  - Doppler ultrasound of the LLE negative for DVT - Appreciate neurology recommendations, feels that patient's symptoms of left lower extremity numbness and weakness are possible conversion disorder . Recommended PT to evaluate   hypertension Continue Norvasc. Coreg and lisinopril on hold.  diabetes mellitus type 2 Hemoglobin A1c is 8.3. CBGs have ranged from 158-193. Hold oral hypoglycemic agents. Sliding scale insulin.   history of TIA Continue aspirin, statin   chronic anemia Follow H&H.   peripheral neuropathy Neurontin.   hyperlipidemia Continue statin.  Code Status: Full code DVT Prophylaxis:  SCD's Family Communication: Discussed in  detail with the patient, all imaging results, lab results explained to the patient    Disposition Plan:   Time Spent in minutes   25 minutes  Procedures:  Excisional debridement, placement of antibiotic beads, wound VAC on 7/4  Consultants:   Orthopedics Neurology  Antimicrobials:   IV vancomycin  IV Zosyn   Medications  Scheduled Meds: . amLODipine  5 mg Oral Daily  . aspirin  325 mg Oral Daily  . atorvastatin  80 mg Oral q1800  . divalproex  1,000 mg Oral QHS  . doxycycline  100 mg Oral Q12H  . ferrous sulfate  325 mg Oral Q breakfast  . FLUoxetine  10 mg Oral Daily  . gabapentin  100 mg Oral Daily  . insulin aspart  0-9 Units Subcutaneous TID WC  . metFORMIN  1,000 mg Oral BID WC  . pantoprazole  40 mg Oral Daily  . sodium chloride  250 mL Intravenous Once   Continuous Infusions:  PRN Meds:.acetaminophen **OR** acetaminophen, metoCLOPramide **OR** metoCLOPramide (REGLAN) injection, ondansetron **OR** ondansetron (ZOFRAN) IV, oxyCODONE-acetaminophen, traZODone   Antibiotics   Anti-infectives    Start     Dose/Rate Route Frequency Ordered Stop   12/22/15 1015  doxycycline (VIBRA-TABS) tablet 100 mg     100 mg Oral Every 12 hours 12/22/15 1007     12/22/15 0000  doxycycline (VIBRA-TABS) 100 MG tablet     100 mg Oral 2 times daily 12/22/15 1010     12/21/15 2000  piperacillin-tazobactam (ZOSYN) IVPB 3.375 g  Status:  Discontinued     3.375 g 12.5 mL/hr over 240 Minutes Intravenous Every 8 hours 12/21/15 1933 12/22/15 1007   12/19/15 0956  gentamicin (GARAMYCIN) injection  Status:  Discontinued       As needed 12/19/15 0957 12/19/15 1110   12/19/15 0949  vancomycin (VANCOCIN) powder  Status:  Discontinued       As needed 12/19/15 0949 12/19/15 1110   12/17/15 0400  vancomycin (VANCOCIN) IVPB 1000 mg/200 mL premix  Status:  Discontinued     1,000 mg 200 mL/hr over 60 Minutes Intravenous Every 12 hours 12/16/15 1716 12/22/15 1007   12/17/15 0000   piperacillin-tazobactam (ZOSYN) IVPB 3.375 g  Status:  Discontinued     3.375 g 12.5 mL/hr over 240 Minutes Intravenous Every 8 hours 12/16/15 1716 12/21/15 1933   12/16/15 1600  vancomycin (VANCOCIN) 1,750 mg in sodium chloride 0.9 % 500 mL IVPB     1,750 mg 250 mL/hr over 120 Minutes Intravenous  Once 12/16/15 1550 12/16/15 1828   12/16/15 1600  piperacillin-tazobactam (ZOSYN) IVPB 3.375 g     3.375 g 12.5 mL/hr over 240 Minutes Intravenous  Once 12/16/15 1550 12/16/15 1827        Subjective:   Shane [name] was seen and examined today.  Continues to complain of left leg numbness and weakness, no significant improvement. Patient denies  dizziness, chest pain, shortness of breath, abdominal pain, N/V/D/C.  Objective:   Filed Vitals:   12/21/15 2257 12/21/15 2358 12/22/15 0051 12/22/15 0525  BP: 179/96 165/92 147/84 146/84  Pulse: 84  95 91  Temp:  98.2 F (36.8 C) 97.9 F (36.6 C) 97.7 F (36.5 C)  TempSrc:  Oral Oral   Resp:  17 18 19   Height:      Weight:      SpO2:  99% 100% 100%    Intake/Output Summary (Last 24 hours) at 12/22/15 1305 Last data filed at 12/22/15 0938  Gross per 24 hour  Intake    549 ml  Output   2350 ml  Net  -1801 ml     Wt Readings from Last 3 Encounters:  12/16/15 76.204 kg (168 lb)  12/11/15 79.4 kg (175 lb 0.7 oz)  12/01/15 77.1 kg (169 lb 15.6 oz)     Exam  General: Alert and oriented x 3, NAD  HEENT:  Neck:   Cardiovascular: S1 S2 clear, RRR  Respiratory: CTAB  Gastrointestinal: Soft, nontender, nondistended, + bowel sounds  Ext: no cyanosis clubbing. Left leg dressing intact with wound VAC  Neuro: no new deficits  Skin: No rashes  Psych: Normal affect and demeanor, alert and oriented x3    Data Reviewed:  I have personally reviewed following labs and imaging studies  Micro Results Recent Results (from the past 240 hour(s))  Blood Culture (routine x 2)     Status: None   Collection Time: 12/16/15  3:50 PM   Result Value Ref Range Status   Specimen Description BLOOD RIGHT ANTECUBITAL  Final   Special Requests BOTTLES DRAWN AEROBIC AND ANAEROBIC 5CC  Final   Culture NO GROWTH 5 DAYS  Final   Report Status 12/21/2015 FINAL  Final  Blood Culture (routine x 2)     Status: None   Collection Time: 12/16/15  3:55 PM  Result Value Ref Range Status   Specimen Description BLOOD LEFT ANTECUBITAL  Final   Special Requests BOTTLES DRAWN AEROBIC ONLY 10CC  Final   Culture NO GROWTH 5 DAYS  Final   Report Status 12/21/2015 FINAL  Final  MRSA PCR Screening     Status: None   Collection Time: 12/18/15 10:43 PM  Result Value Ref Range Status   MRSA by PCR NEGATIVE NEGATIVE Final    Comment:        The GeneXpert MRSA Assay (FDA approved for NASAL specimens only), is one component of a comprehensive MRSA colonization surveillance program. It is not intended to diagnose MRSA infection nor to guide or monitor treatment for MRSA infections.   Surgical pcr screen     Status: None   Collection Time: 12/19/15  7:03 AM  Result Value Ref Range Status   MRSA, PCR NEGATIVE NEGATIVE Final   Staphylococcus aureus NEGATIVE NEGATIVE Final    Comment:        The Xpert SA Assay (FDA approved for NASAL specimens in patients over 76 years of age), is one component of a comprehensive surveillance program.  Test performance has been validated by [facility] for patients greater than or equal to 16 year old. It is not intended to diagnose infection nor to guide or monitor treatment.     Radiology Reports Dg Chest 2 View  12/16/2015  CLINICAL DATA:  Toe amputation recently.  Possible sepsis. EXAM: CHEST  2 VIEW COMPARISON:  12/11/2015 FINDINGS: Lungs are adequately inflated without focal consolidation, effusion or pneumothorax. Cardiomediastinal  silhouette and remainder of the exam is unchanged. IMPRESSION: No acute disease. Electronically Signed   By: Marin Olp M.D.   On: 12/16/2015 18:15   Dg Chest 2  View  12/11/2015  CLINICAL DATA:  TIA EXAM: CHEST  2 VIEW COMPARISON:  12/01/2015 FINDINGS: Heart and mediastinal contours are within normal limits. No focal opacities or effusions. No acute bony abnormality. IMPRESSION: No active cardiopulmonary disease. Electronically Signed   By: Rolm Baptise M.D.   On: 12/11/2015 07:40   Dg Chest 2 View  12/01/2015  CLINICAL DATA:  Chest pain for couple of hours. EXAM: CHEST  2 VIEW COMPARISON:  08/26/2015 FINDINGS: The heart size and mediastinal contours are within normal limits. Both lungs are clear. The visualized skeletal structures are unremarkable. IMPRESSION: No active cardiopulmonary disease. Electronically Signed   By: Lucienne Capers M.D.   On: 12/01/2015 01:37   Mr Brain Wo Contrast  12/11/2015  CLINICAL DATA:  Recurrent acute LEFT-sided numbness and weakness. Headache. EXAM: MRI HEAD WITHOUT CONTRAST TECHNIQUE: Multiplanar, multiecho pulse sequences of the brain and surrounding structures were obtained without intravenous contrast. COMPARISON:  None. FINDINGS: No evidence for acute infarction, hemorrhage, mass lesion, hydrocephalus, or extra-axial fluid. Normal cerebral volume. No significant white matter disease. Pituitary, pineal, and cerebellar tonsils unremarkable. No upper cervical lesions. Flow voids are maintained throughout the carotid, basilar, and vertebral arteries. There are no areas of chronic hemorrhage. Visualized calvarium, skull base, and upper cervical osseous structures unremarkable. Scalp and extracranial soft tissues, orbits, sinuses, and mastoids show no acute process. IMPRESSION: Negative exam.  No acute or focal intracranial findings. Electronically Signed   By: Staci Righter M.D.   On: 12/11/2015 13:34   Mr Lumbar Spine W Wo Contrast  12/20/2015  CLINICAL DATA:  Left foot wound with recent toe amputation. Diabetes, hypertension, hyperlipidemia. Left leg weakness EXAM: MRI LUMBAR SPINE WITHOUT AND WITH CONTRAST TECHNIQUE: Multiplanar  and multiecho pulse sequences of the lumbar spine were obtained without and with intravenous contrast. CONTRAST:  67mL MULTIHANCE GADOBENATE DIMEGLUMINE 529 MG/ML IV SOLN COMPARISON:  None. FINDINGS: Segmentation:  Normal segmentation.  Lowest disc space L5-S1. Alignment:  Normal Vertebrae: Negative for fracture or mass. No evidence of spinal infection. Normal enhancement of the lumbar spine Conus medullaris: Extends to the L1 level and appears normal. Paraspinal and other soft tissues: Paraspinous muscles normal. No retroperitoneal mass or fluid collection. Diffuse bladder wall thickening, question cystitis. Disc levels: L1-2:  Negative L2-3:  Negative L3-4:  Negative L4-5: Mild disc and mild facet degeneration. Mild foraminal narrowing bilaterally. No significant spinal stenosis L5-S1:  Negative IMPRESSION: Mild disc degeneration at L4-5 with mild foraminal narrowing bilaterally. No disc protrusion or spinal stenosis Bladder wall thickening, question cystitis Electronically Signed   By: Franchot Gallo M.D.   On: 12/20/2015 19:13   Mr Foot Left Wo Contrast  12/17/2015  CLINICAL DATA:  Cellulitis of the left foot. Drainage of an abscess on December 02, 2015. EXAM: MRI OF THE LEFT FOOT WITHOUT CONTRAST TECHNIQUE: Multiplanar, multisequence MR imaging was performed. No intravenous contrast was administered. COMPARISON:  Radiographs dated 12/16/2015 and MRI dated 12/01/2015 FINDINGS: There has been marked reduction in the size of the subcutaneous abscess on the dorsum of the left foot. The abscess now measures approximately 4.3 cm in width and 3.9 cm in length and approximately 6 mm in depth. This communicates with the soft tissue wound on the dorsum of the foot which is centered over the abscess. Marked reduction in the subcutaneous  edema on the dorsum of the foot. There is no osteomyelitis or joint effusions.  No myositis. IMPRESSION: Marked improvement in the subcutaneous abscess on the dorsum of the foot. Residual  abscess deep to the incision as described. Electronically Signed   By: Lorriane Shire M.D.   On: 12/17/2015 12:16   Mr Foot Left Wo Contrast  12/02/2015  CLINICAL DATA:  Left foot pain and left second toe swelling for 2 weeks. Fever and chills. Question abscess or osteomyelitis. EXAM: MRI OF THE LEFT FOREFOOT WITHOUT CONTRAST TECHNIQUE: Multiplanar, multisequence MR imaging was performed. No intravenous contrast was administered. COMPARISON:  Plain films left foot 11/30/2015. MRI left foot 08/02/2015. FINDINGS: Amputation of the great toe is identified as seen on comparison plain films. Marked soft tissue edema is seen over the dorsum of the foot. A large area of heterogeneous signal abnormality is seen in the subcutaneous soft tissues over the dorsum of the foot. This area measures up to 4.4 cm transverse at the level of the distal metatarsals by 1.8 cm craniocaudal by 5.6 cm long. Smaller areas of similar signal abnormality are seen extending to the level of the mid foot. The coronal STIR sequence demonstrates mild appearing edema distal phalanx of the second toe but this cannot be confirmed on other sequences. Bone marrow signal is otherwise unremarkable. IMPRESSION: Intense edema over the dorsum of the foot is compatible with cellulitis. Heterogeneous areas of signal abnormality in the dorsum of the foot is worrisome for abscess. Mildly increased T2 signal in the coronal STIR sequence in the distal phalanx of the second toe cannot be confirmed on other sequences. No convincing evidence of osteomyelitis is identified. Electronically Signed   By: Inge Rise M.D.   On: 12/02/2015 08:43   Dg Foot Complete Left  12/16/2015  CLINICAL DATA:  Left foot numbness.  Rule out osteomyelitis EXAM: LEFT FOOT - COMPLETE 3+ VIEW COMPARISON:  12/14/2015 FINDINGS: Amputation of the great toe at the level of the MTP joint. Amputation of the second toe at the level of the distal aspect of the proximal phalanx. Negative  for osteomyelitis. No fracture or arthropathy. No change from the recent study. Arterial calcification. Wound on the dorsum of the foot. IMPRESSION: No interval change.  Negative for osteomyelitis. Electronically Signed   By: Franchot Gallo M.D.   On: 12/16/2015 19:11   Dg Foot Complete Left  12/14/2015  CLINICAL DATA:  Awoke with complete numbness LEFT foot, no feeling, no pain, surgery 1 week ago with partial amputation of second toe and I & D of infected area, swelling, history type II diabetes mellitus, CHF, hyperlipidemia, hypertension EXAM: LEFT FOOT - COMPLETE 3+ VIEW COMPARISON:  11/30/2015 FINDINGS: Osseous mineralization normal. Prior amputation great toe. Interval amputation second toe through head of proximal phalanx. Soft tissue swelling of forefoot and second toe stump. No acute fracture, dislocation, or bone destruction. Scattered small vessel vascular calcification. IMPRESSION: Interval amputation of second toe through head of proximal phalanx. Prior amputation great toe. No acute osseous abnormalities. Electronically Signed   By: Lavonia Dana M.D.   On: 12/14/2015 15:15   Dg Foot Complete Left  12/01/2015  CLINICAL DATA:  Left foot wound. Diabetes. Second toe nail fell off 5 days ago. EXAM: LEFT FOOT - COMPLETE 3+ VIEW COMPARISON:  11/21/2015 FINDINGS: Previous amputation of the left first toe at the metatarsal-phalangeal joint level. Soft tissues at the site of resection or prominent but unchanged since previous study. No radiopaque soft tissue foreign bodies or  gas collections. Vascular calcifications. No bone erosion or sclerosis to suggest osteomyelitis. No acute fracture or dislocation. IMPRESSION: Previous amputation of the left first toe. No evidence of osteomyelitis. Electronically Signed   By: Lucienne Capers M.D.   On: 12/01/2015 00:27   Ct Head Code Stroke W/o Cm  12/10/2015  CLINICAL DATA:  45 year old male with code stroke. EXAM: CT HEAD WITHOUT CONTRAST TECHNIQUE: Contiguous  axial images were obtained from the base of the skull through the vertex without intravenous contrast. COMPARISON:  Head CT dated 08/26/2015 and MRI dated 08/26/2015 FINDINGS: The ventricles and the sulci are appropriate in size for the patient's age. There is no intracranial hemorrhage. No midline shift or mass effect identified. The gray-white matter differentiation is preserved. The visualized paranasal sinuses and mastoid air cells are well aerated. The calvarium is intact. IMPRESSION: No acute intracranial pathology. These results were called by telephone at the time of interpretation on 12/10/2015 at 9:08 pm to Dr. Nicole Kindred, who verbally acknowledged these results. Electronically Signed   By: Anner Crete M.D.   On: 12/10/2015 21:16    Lab Data:  CBC:  Recent Labs Lab 12/16/15 1515 12/17/15 0405 12/18/15 0454 12/19/15 0539 12/20/15 0531 12/21/15 0728  WBC 7.4 9.5 7.6 7.8 8.0 7.4  NEUTROABS 5.0  --   --   --   --   --   HGB 10.7* 10.3* 9.8* 10.3* 10.3* 10.1*  HCT 33.9* 32.5* 30.3* 32.2* 32.6* 31.8*  MCV 95.5 95.3 93.5 93.9 96.2 94.1  PLT 442* 411* 355 384 340 [id number]   Basic Metabolic Panel:  Recent Labs Lab 12/17/15 0405 12/18/15 0454 12/19/15 0539 12/20/15 0531 12/21/15 0728  NA 140 145 140 137 138  K 4.7 3.9 4.1 4.1 4.2  CL 108 100* 105 103 107  CO2 26 27 28 27 26   GLUCOSE 199* 156* 142* 206* 215*  BUN 15 12 14 15 13   CREATININE 0.99 1.02 1.09 1.10 1.08  CALCIUM 9.2 10.1 9.3 9.0 9.4   GFR: Estimated Creatinine Clearance: 82.7 mL/min (by C-G formula based on Cr of 1.08). Liver Function Tests:  Recent Labs Lab 12/16/15 1515 12/21/15 0728  AST 20 13*  ALT 24 16*  ALKPHOS 48 46  BILITOT 0.4 0.2*  PROT 7.2 6.4*  ALBUMIN 3.7 3.2*   No results for input(s): LIPASE, AMYLASE in the last 168 hours. No results for input(s): AMMONIA in the last 168 hours. Coagulation Profile: No results for input(s): INR, PROTIME in the last 168 hours. Cardiac Enzymes: No results  for input(s): CKTOTAL, CKMB, CKMBINDEX, TROPONINI in the last 168 hours. BNP (last 3 results) No results for input(s): PROBNP in the last 8760 hours. HbA1C: No results for input(s): HGBA1C in the last 72 hours. CBG:  Recent Labs Lab 12/21/15 1203 12/21/15 1657 12/21/15 2054 12/22/15 0757 12/22/15 1150  GLUCAP 215* 191* 197* 219* 288*   Lipid Profile: No results for input(s): CHOL, HDL, LDLCALC, TRIG, CHOLHDL, LDLDIRECT in the last 72 hours. Thyroid Function Tests: No results for input(s): TSH, T4TOTAL, FREET4, T3FREE, THYROIDAB in the last 72 hours. Anemia Panel:  Recent Labs  12/21/15 0728 12/21/15 0729  VITAMINB12 343  --   FOLATE  --  16.4   Urine analysis:    Component Value Date/Time   COLORURINE YELLOW 04/24/2015 1056   APPEARANCEUR CLEAR 04/24/2015 1056   LABSPEC 1.013 04/24/2015 1056   PHURINE 5.0 04/24/2015 1056   GLUCOSEU NEGATIVE 04/24/2015 1056   HGBUR MODERATE* 04/24/2015 1056   BILIRUBINUR NEGATIVE  04/24/2015 [place] 04/24/2015 1056   PROTEINUR NEGATIVE 04/24/2015 1056   UROBILINOGEN 0.2 04/24/2015 1056   NITRITE NEGATIVE 04/24/2015 1056   LEUKOCYTESUR NEGATIVE 04/24/2015 Joseph M.D. [facility] 12/22/2015, 1:05 PM  Pager: [phone] Between 7am to 7pm - call Pager - [phone]  After 7pm go to [email or web address] - password [email or web address]  Call night coverage person covering after 7pm

## 2015-12-22 NOTE — Progress Notes (Signed)
Physical Therapy Treatment Patient Details Name: Shane [name] MRN: [id number] DOB: 11-05-1970 Today's Date: 12/22/2015    History of Present Illness Pt is a 45 y/o M s/p [name]. Pt's PMH includes Lt great toe amputation, TIA, anemia, depression, anxiety, bipolar disorder.  Pt returns to ED with reports of pain and numbness in the L foot.  pt s/p I and D for infection with antibiotic beeds added.  Pt reports since then that numbness has progressed up his leg to hip with associated inability to move his leg.    PT Comments    Patient progressing slowly towards PT goals. Reports numbness throughout LLE from thigh to foot- does not respond to noxious stimuli or pressure. Not able to perform active movement on command but movement noted during functional activities. Inconsistent findings noted throughout session. Tolerated gait training with Min guard assist to manage IV pole/wound vac, fatigues quickly. Will be staying in hospital to get a second opinion from Dr. Sharol Given. Will continue to follow.   Follow Up Recommendations  Home health PT;Other (comment);Supervision/Assistance - 24 hour (vs SNF if not)     Equipment Recommendations  None recommended by PT    Recommendations for Other Services OT consult     Precautions / Restrictions Precautions Precautions: Fall Required Braces or Orthoses: Other Brace/Splint Other Brace/Splint: post op shoe Restrictions Weight Bearing Restrictions: Yes LLE Weight Bearing: Weight bearing as tolerated    Mobility  Bed Mobility Overal bed mobility: Needs Assistance Bed Mobility: Supine to Sit;Sit to Supine     Supine to sit: Supervision Sit to supine: Min assist   General bed mobility comments: doesn't voluntarily move his L LE, but sometimes notice moving automatically; cues to use RLE to hook under LLE to bring into bed. ABle to bring BLEs to EOB without assist.   Transfers Overall transfer level: Needs assistance Equipment  used: Rolling walker (2 wheeled) Transfers: Sit to/from Stand Sit to Stand: Min guard         General transfer comment: Min guard for safety upon standing.   Ambulation/Gait Ambulation/Gait assistance: Min guard Ambulation Distance (Feet): 20 Feet Assistive device: Rolling walker (2 wheeled) Gait Pattern/deviations: Step-to pattern;Shuffle   Gait velocity interpretation: Below normal speed for age/gender General Gait Details: Pt dragging LLE but able to advance without assist; step to gait with WB through LLE without knee buckling noted. Heavy use of UEs on RW. 2/4 DOE.    Stairs            Wheelchair Mobility    Modified Rankin (Stroke Patients Only)       Balance Overall balance assessment: Needs assistance Sitting-balance support: Feet supported;No upper extremity supported Sitting balance-Leahy Scale: Good     Standing balance support: During functional activity Standing balance-Leahy Scale: Poor Standing balance comment: Reliant on BUEs for support on RW.                     Cognition Arousal/Alertness: Awake/alert Behavior During Therapy: Flat affect Overall Cognitive Status: Within Functional Limits for tasks assessed                      Exercises      General Comments General comments (skin integrity, edema, etc.): Wife present in room. Dr. Sharol Given entering room to end session.      Pertinent Vitals/Pain Pain Assessment: No/denies pain (says left leg is numb)    Home Living  Prior Function            PT Goals (current goals can now be found in the care plan section) Progress towards PT goals: Progressing toward goals (slowly)    Frequency  Min 3X/week    PT Plan Current plan remains appropriate    Co-evaluation             End of Session Equipment Utilized During Treatment: Gait belt Activity Tolerance: Patient limited by fatigue;Patient tolerated treatment well Patient left: in  bed;with call bell/phone within reach;Other (comment);with family/visitor present (Dr. Sharol Given present in room.)     Time: 1405 (-1415)-1449 (-1503) PT Time Calculation (min) (ACUTE ONLY): 44 min  Charges:  $Gait Training: 8-22 mins (had to leave room when wound vac RN came in, returned to finish session)                    G Codes:      [place] 12/22/2015, 3:06 PM Wray Kearns, [place], DPT [phone]

## 2015-12-22 NOTE — Consult Note (Signed)
ORTHOPAEDIC CONSULTATION  REQUESTING PHYSICIAN: Ripudeep Krystal Eaton, MD  Chief Complaint: Lack of sensory and motor function left lower extremity.  HPI: Shane [name] is a 45 y.o. male who presents with diabetic insensate neuropathy with a abscess involving the dorsum of the left foot. Patient underwent irrigation and debridement of the abscess placement of antibiotic beads and placement of an infusion wound VAC with Dr. Marlou Sa on July 4. Since that time patient's wife states that he has had a progressive numbness that is started in his foot and extended all the way up to his hip. Patient states that this time he has no sensation in the entire left lower extremity and has no motor function.  Past Medical History  Diagnosis Date  . Hypertension     a. 08/2014 Admitted with hypertensive urgency.  . Chest pain     a. 2015 Reportedly normal stress test in [place].  Marland Kitchen TIA (transient ischemic attack) 08/2014; 03/2015    a. 08/2014 in setting of hypertensive urgency.  . Anemia   . Refusal of blood transfusions as patient is Jehovah's Witness   . Hyperlipidemia   . Chronic diastolic CHF (congestive heart failure) (HCC)     a.03/2015 Echo: EF 55-60%, Gr 1 DD, mild MR, triv PR.  . Type II diabetes mellitus ([place])   . Sleep apnea   . GERD (gastroesophageal reflux disease)   . Anxiety   . Depression   . Bipolar disorder [facility])    Past Surgical History  Procedure Laterality Date  . Inguinal hernia repair Bilateral ~ 1983- ~ 1986  . Tonsillectomy  ~ 1985  . Circumcision    . Amputation Left 08/03/2015    Procedure: AMPUTATION LEFT GREAT TOE;  Surgeon: Newt Minion, MD;  Location: [facility];  Service: Orthopedics;  Laterality: Left;  . I&d extremity Left 12/02/2015    Procedure: IRRIGATION AND DEBRIDEMENT OF FOOT; LEFT SECOND TOE AMPUTATION;  Surgeon: Meredith Pel, MD;  Location: [facility];  Service: Orthopedics;  Laterality: Left;  . Amputation Left 12/02/2015    Procedure: amputation of left 2nd digit  .  I&d extremity Left 12/19/2015    Procedure: I & D LEFT FOOT WITH BEADS ;  Surgeon: Meredith Pel, MD;  Location: [facility];  Service: Orthopedics;  Laterality: Left;  . Application of wound vac Left 12/19/2015    Procedure: APPLICATION OF WOUND VAC;  Surgeon: Meredith Pel, MD;  Location: [facility];  Service: Orthopedics;  Laterality: Left;   Social History   Social History  . Marital Status: Married    Spouse Name: N/A  . Number of Children: N/A  . Years of Education: N/A   Social History Main Topics  . Smoking status: Never Smoker   . Smokeless tobacco: Never Used  . Alcohol Use: No  . Drug Use: No  . Sexual Activity: No   Other Topics Concern  . None   Social History Narrative   Lives in [place] with wife.  Active but doesn't routinely exercise.   Family History  Problem Relation Age of Onset  . Hypertension Mother   . Diabetes Mother   . Hyperlipidemia Mother   . Heart disease Mother     s/p pacemaker  . Diabetes Father   . Hypertension Father   . Stroke Father   . Heart attack Father     first MI @ 45.  . Stroke Brother    - negative except otherwise stated in the family history section  Allergies  Allergen Reactions  . Eggs Or Egg-Derived Products Diarrhea  . Other Other (See Comments)    Red meat causes stomach pains, bloating and diarrhea  . Milk-Related Compounds Diarrhea and Other (See Comments)    Any dairy products  - diarrhea and bloating   Prior to Admission medications   Medication Sig Start Date End Date Taking? Authorizing Provider  amLODipine (NORVASC) 5 MG tablet Take 1 tablet (5 mg total) by mouth daily. 09/04/15  Yes Maren Reamer, MD  amoxicillin-clavulanate (AUGMENTIN) 875-125 MG tablet Take 1 tablet by mouth 2 (two) times daily. 12/12/15  Yes Reyne Dumas, MD  aspirin EC 325 MG tablet Take 1 tablet (325 mg total) by mouth daily. Patient taking differently: Take 325 mg by mouth every morning.  09/04/15  Yes Maren Reamer, MD  atorvastatin  (LIPITOR) 80 MG tablet Take 1 tablet (80 mg total) by mouth every morning. 08/18/15  Yes Josalyn Funches, MD  carvedilol (COREG) 12.5 MG tablet TAKE 1 TABLET BY MOUTH 2 TIMES DAILY WITH A MEAL Patient taking differently: TAKE 1 TABLET BY MOUTH IN THE MORNING. 05/17/15  Yes Josalyn Funches, MD  divalproex (DEPAKOTE) 500 MG DR tablet Take 2 tablets (1,000 mg total) by mouth at bedtime. 12/04/15  Yes Venetia Maxon Rama, MD  ferrous sulfate 325 (65 FE) MG tablet Take 1 tablet (325 mg total) by mouth 2 (two) times daily with a meal. Patient taking differently: Take 325 mg by mouth daily with breakfast.  08/18/15  Yes Josalyn Funches, MD  FLUoxetine (PROZAC) 10 MG tablet Take 10 mg by mouth daily.   Yes Historical Provider, MD  gabapentin (NEURONTIN) 100 MG capsule Take 1 capsule (100 mg total) by mouth 3 (three) times daily. Patient taking differently: Take 100 mg by mouth daily.  09/04/15  Yes Dawn Lazarus Gowda, MD  glipiZIDE (GLUCOTROL) 10 MG tablet Take 1 tablet (10 mg total) by mouth 2 (two) times daily before a meal. Patient taking differently: Take 10 mg by mouth daily before breakfast.  08/18/15  Yes Josalyn Funches, MD  lisinopril (PRINIVIL,ZESTRIL) 40 MG tablet Take 1 tablet (40 mg total) by mouth daily. 08/18/15  Yes Josalyn Funches, MD  omeprazole (PRILOSEC) 20 MG capsule Take 1 capsule (20 mg total) by mouth daily. 08/18/15  Yes Josalyn Funches, MD  sulfamethoxazole-trimethoprim (BACTRIM DS,SEPTRA DS) 800-160 MG tablet Take 1 tablet by mouth 2 (two) times daily. 12/12/15  Yes Reyne Dumas, MD  traZODone (DESYREL) 50 MG tablet Take 0.5-1 tablets (25-50 mg total) by mouth at bedtime as needed for sleep. 11/21/15  Yes Josalyn Funches, MD  doxycycline (VIBRA-TABS) 100 MG tablet Take 1 tablet (100 mg total) by mouth 2 (two) times daily. X 2 weeks 12/22/15   Ripudeep Krystal Eaton, MD  metFORMIN (GLUCOPHAGE) 1000 MG tablet Take 1 tablet (1,000 mg total) by mouth 2 (two) times daily with a meal. 12/22/15   Ripudeep Krystal Eaton, MD    oxyCODONE-acetaminophen (PERCOCET/ROXICET) 5-325 MG tablet Take 1 tablet by mouth every 6 (six) hours as needed for severe pain. 12/22/15   Ripudeep Krystal Eaton, MD   Mr Lumbar Spine W Wo Contrast  12/20/2015  CLINICAL DATA:  Left foot wound with recent toe amputation. Diabetes, hypertension, hyperlipidemia. Left leg weakness EXAM: MRI LUMBAR SPINE WITHOUT AND WITH CONTRAST TECHNIQUE: Multiplanar and multiecho pulse sequences of the lumbar spine were obtained without and with intravenous contrast. CONTRAST:  60mL MULTIHANCE GADOBENATE DIMEGLUMINE 529 MG/ML IV SOLN COMPARISON:  None. FINDINGS: Segmentation:  Normal  segmentation.  Lowest disc space L5-S1. Alignment:  Normal Vertebrae: Negative for fracture or mass. No evidence of spinal infection. Normal enhancement of the lumbar spine Conus medullaris: Extends to the L1 level and appears normal. Paraspinal and other soft tissues: Paraspinous muscles normal. No retroperitoneal mass or fluid collection. Diffuse bladder wall thickening, question cystitis. Disc levels: L1-2:  Negative L2-3:  Negative L3-4:  Negative L4-5: Mild disc and mild facet degeneration. Mild foraminal narrowing bilaterally. No significant spinal stenosis L5-S1:  Negative IMPRESSION: Mild disc degeneration at L4-5 with mild foraminal narrowing bilaterally. No disc protrusion or spinal stenosis Bladder wall thickening, question cystitis Electronically Signed   By: Franchot Gallo M.D.   On: 12/20/2015 19:13   - pertinent xrays, CT, MRI studies were reviewed and independently interpreted  Positive ROS: All other systems have been reviewed and were otherwise negative with the exception of those mentioned in the HPI and as above.  Physical Exam: General: Alert, no acute distress Cardiovascular: No pedal edema Respiratory: No cyanosis, no use of accessory musculature GI: No organomegaly, abdomen is soft and non-tender Skin: No ischemic lesions left foot. Patient's foot is warm no skin color or  temperature changes. No cellulitis. Neurologic: Patient has sensation in the right lower extremity but has no sensation involving the entire thigh and calf and foot circumferential left lower extremity. Psychiatric: Patient is competent for consent with normal mood and affect Lymphatic: No axillary or cervical lymphadenopathy  MUSCULOSKELETAL:  On examination patient has a good dorsalis pedis pulse is no evidence of any ischemic problems. His foot is warm of legs have the same color consistency and turgor. The temperature is equal in both lower extremities. With pinching and light touch patient states he has no feeling in the entire left lower extremity from his hip to the tip of his toes circumferential. He has no active motor strength in any motor groups of the left lower extremity. Patient has had an MRI scan which shows no evidence of a herniated disc no nerve root compression.  Assessment: Loss of motor and sensory function left lower extremity status post irrigation debridement abscess dorsum of the left foot.  Plan: Plan: Discussed with the patient and his wife etiology of loss of motor and sensory function. Discussed that it would be highly unlikely for the surgical debridement of the foot to affect proximal motor and sensory function. Discussed that it could affect motor and sensory function distal to the surgical site. Discussed that this most likely need to be further evaluated from a neurologic standpoint. There could be a vascular compromise to the nerve causing strokelike symptoms or a demyelinating disease.  Thank you for the consult and the opportunity to see Mr. Benjaman Pott, [place] [phone] 5:03 PM

## 2015-12-22 NOTE — Progress Notes (Signed)
CM spoke with Page Alexia Freestone @ [phone] regarding charity case for wound vac. CM faxed face sheet, H & P, and operative report (wound dimensions) to Page @ [phone]. CM instructed nurse to have prescribing MD sign  [facility] Therapy Insurance Authorization form(v.7.0), section 2 and once completed nurse is to faxed form to [phone]. Whitman Hero RN,BSN,CM [phone]

## 2015-12-22 NOTE — Progress Notes (Signed)
Ortho saw pt per pt request. Ortho recommended pt to be seen again by neurology for numbness to LLE. Rai, MD contacted and notified of recommendation.

## 2015-12-22 NOTE — Hospital Discharge Follow-Up (Signed)
Transitional [facility] Clinic at [place]:  Patient agreeable to follow-up, case management services with the [facility] after discharge. Appointment scheduled for 12/28/15 at 1115 with Dr. Jarold Song. Appointment on AVS. Patient aware of resources available at pharmacy at [facility]. Met with patient once again at bedside to follow-up on [facility] application as patient informed this Case Manager on 12/21/15 that he had not heard about status of his [employer]. This Case Manager discussed with Development worker, community at [employer] and [employer] who indicated patient had not applied for [employer] or [employer]. Updated patient, and informed patient he will need to maintain close contact with inpatient Financial Counselor regarding his pending Medicaid application. Patient verbalized understanding and indicated he had contact number for Financial Counselor handling his case. Will continue to follow patient's medical progress closely.

## 2015-12-22 NOTE — Progress Notes (Signed)
PT Cancellation Note  Patient Details Name: Shane [name] MRN: [id number] DOB: [birth year]   Cancelled Treatment:    Reason Eval/Treat Not Completed: Other (comment)  Spent 10 minutes getting pt to EOB and discussing changes in LLE function- reporting no sensation from thigh to foot; someone came in to look at wound and check wound vac. Will follow up as time allows.  Marguarite Arbour A Tierney Behl 12/22/2015, 2:33 PM Wray Kearns, [place], DPT [phone]

## 2015-12-22 NOTE — Progress Notes (Signed)
VASCULAR LAB PRELIMINARY  PRELIMINARY  PRELIMINARY  PRELIMINARY  Left lower extremity venous duplex has bee completed.    Preliminary report:  Left:  No evidence of DVT, superficial thrombosis, or Baker's cyst.   Janifer Adie, RVT, RDMS 12/22/2015, 1:03 PM

## 2015-12-22 NOTE — Care Management Note (Addendum)
Case Management Note  Patient Details  Name: Shane [name] MRN: [id number] Date of Birth: 1971/01/29  Subjective/Objective:                 Pt is a 45 y/o M s/p [name]. Pt's PMH includes Lt great toe amputation, TIA, anemia, depression, anxiety, bipolar disorder. Pt returns to ED with reports of pain and numbness in the L foot. pt s/p I and D for infection with antibiotic beeds added 12/19/2015.  Action/Plan: Plan is to discharge to home today with wound vac(Prevena).  Expected Discharge Date:    12/22/2015          Expected Discharge Plan:  Home/Self Care  In-House Referral:     Discharge planning Services  CM Consult  Post Acute Care Choice:    Choice offered to:     DME Arranged:   Prevena wound vac  DME Agency:   [facility]/ referral made with Page Walters([phone]) regarding wound vac(Prevena) for charity. Pt without insurance. Page to f/u with CM after speaking with Dr.Dean.  HH Arranged:    [place] Agency:     Status of Service:  Completed, signed off  If discussed at [place] of Stay Meetings, dates discussed:    Additional Comments:  Patient agreeable to follow-up with the [facility] after discharge. Appointment scheduled for 12/28/15 at 1115 with Dr. Jarold Song. Appointment on AVS.   Whitman Hero [place], [place] [phone] 12/22/2015, 11:44 AM

## 2015-12-23 ENCOUNTER — Inpatient Hospital Stay (HOSPITAL_COMMUNITY): Payer: Medicaid Other

## 2015-12-23 LAB — GLUCOSE, CAPILLARY
GLUCOSE-CAPILLARY: 240 mg/dL — AB (ref 65–99)
GLUCOSE-CAPILLARY: 151 mg/dL — AB (ref 65–99)
Glucose-Capillary: 95 mg/dL (ref 65–99)

## 2015-12-23 MED ORDER — GABAPENTIN 100 MG PO CAPS: 100.0000 mg | ORAL_CAPSULE | Freq: Three times a day (TID) | ORAL | Status: DC

## 2015-12-23 MED ORDER — GLIPIZIDE 5 MG PO TABS
10.0000 mg | ORAL_TABLET | Freq: Every day | ORAL | Status: DC
  Administered 2015-12-23: 10 mg via ORAL
  Filled 2015-12-23: qty 2

## 2015-12-23 MED ORDER — GABAPENTIN 100 MG PO CAPS
100.0000 mg | ORAL_CAPSULE | Freq: Three times a day (TID) | ORAL | Status: DC
  Administered 2015-12-23 (×2): 100 mg via ORAL
  Filled 2015-12-23 (×2): qty 1

## 2015-12-23 NOTE — Progress Notes (Signed)
Wife in room with patient and conversation held re:  Discharge.  Wife appears confused and adamant that patient not be discharged "because he needs an MRI of his foot to see why he has numbness".  She states that every physician she speaks with "passes it off to someone else and doesn't give me a straight answer".  She states she recorded Dr. Jess Barters conversation without his knowledge where he stated the patient  "definitely had a problem" and she was not going to go home until the "problem" has been thoroughly diagnosed.  Neither she nor the patient are willing to accept explanations for the numbness.  Dr. [name] spoke with wife via the phone and she was not satisfied with the outcome of their discussion.  She continues to state that if patient is "forced to be discharged, than we will get a lawyer".  Patient has positive pulses in BLE, which are warm to touch with excellent cap. refill. Awaiting wound vac tech to bring supplies for home therapy and to discuss this with patient and wife.  AC made aware of progression of situation.  Patient and spouse both supported throughout process.

## 2015-12-23 NOTE — Progress Notes (Signed)
Shane [name] discharged Home per MD order.  Discharge instructions reviewed and discussed with the patient, all questions and concerns answered. Copy of instructions and scripts given to patient.    Medication List    STOP taking these medications        amoxicillin-clavulanate 875-125 MG tablet  Commonly known as:  AUGMENTIN     sulfamethoxazole-trimethoprim 800-160 MG tablet  Commonly known as:  BACTRIM DS,SEPTRA DS      TAKE these medications        amLODipine 5 MG tablet  Commonly known as:  NORVASC  Take 1 tablet (5 mg total) by mouth daily.     aspirin EC 325 MG tablet  Take 1 tablet (325 mg total) by mouth daily.     atorvastatin 80 MG tablet  Commonly known as:  LIPITOR  Take 1 tablet (80 mg total) by mouth every morning.     carvedilol 12.5 MG tablet  Commonly known as:  COREG  TAKE 1 TABLET BY MOUTH 2 TIMES DAILY WITH A MEAL     divalproex 500 MG DR tablet  Commonly known as:  DEPAKOTE  Take 2 tablets (1,000 mg total) by mouth at bedtime.     doxycycline 100 MG tablet  Commonly known as:  VIBRA-TABS  Take 1 tablet (100 mg total) by mouth 2 (two) times daily. X 2 weeks     ferrous sulfate 325 (65 FE) MG tablet  Take 1 tablet (325 mg total) by mouth 2 (two) times daily with a meal.     FLUoxetine 10 MG tablet  Commonly known as:  PROZAC  Take 10 mg by mouth daily.     gabapentin 100 MG capsule  Commonly known as:  NEURONTIN  Take 1 capsule (100 mg total) by mouth 3 (three) times daily.     glipiZIDE 10 MG tablet  Commonly known as:  GLUCOTROL  Take 1 tablet (10 mg total) by mouth 2 (two) times daily before a meal.     lisinopril 40 MG tablet  Commonly known as:  PRINIVIL,ZESTRIL  Take 1 tablet (40 mg total) by mouth daily.     metFORMIN 1000 MG tablet  Commonly known as:  GLUCOPHAGE  Take 1 tablet (1,000 mg total) by mouth 2 (two) times daily with a meal.     omeprazole 20 MG capsule  Commonly known as:  PRILOSEC  Take 1 capsule (20 mg total) by  mouth daily.     oxyCODONE-acetaminophen 5-325 MG tablet  Commonly known as:  PERCOCET/ROXICET  Take 1 tablet by mouth every 6 (six) hours as needed for severe pain.     traZODone 50 MG tablet  Commonly known as:  DESYREL  Take 0.5-1 tablets (25-50 mg total) by mouth at bedtime as needed for sleep.        Patients skin is clean and dry.  Portable wound vac placed, and all questions answered in regard to its proper use. IV site discontinued and catheter remains intact. Site without signs and symptoms of complications. Dressing and pressure applied.  Patient escorted to car by NT in a wheelchair,  no distress noted upon discharge.  [name] 12/23/2015 8:28 PM

## 2015-12-23 NOTE — Care Management Note (Addendum)
Case Management Note  Patient Details  Name: Yvonne Stueber MRN: [id number] Date of Birth: Sep 22, 1970  Subjective/Objective:                  I & D LEFT FOOT WITH BEADS  APPLICATION OF WOUND VAC  Action/Plan: Cm spoke to patient at the bedside. [facility] nurse at the bedside for wound vac care. Patient said that he lives at home with his spouse and his 47 year old granddaughter. Patient's spouse able to provide needed support per the patient; he said that she is home most of the day unless she has to run out for errands. CM offered patient a list of private duty nursing agencies and patient declined. Patient to follow up with [facility] and already has an appointment. CM explained that he can get his medications filled here in the future.  [place] letter provided for current medications needed at discharge as [facility] pharmacy closed and patient said that he can't afford the out of pocket amount even with the goodrx coupon but can afford the $3/medication with [facility]. [place] letter provided to patient and program explained.[facility] accepted patient for RN and SW for charity case and patient agreeable. CM explained to patient that Aide, PT/OT is not covered by charity but will send referral for outpatient PT/OT to [place].  Patient has a walker at home but does not have a 3N1 and feels that would assist with maintaining safety. Cm called Jeneen Rinks with [facility] to request 3N1 be delivered to the bedside.   CM remains available should additional needs arise.  Expected Discharge Date:  12/23/15               Expected Discharge Plan:  [place]  In-House Referral:     Discharge planning Services  CM Consult, Medication Assistance, [facility], [facility]  Post Acute Care Choice:  Durable Medical Equipment Choice offered to:  Patient  DME Arranged:  3-N-1 DME Agency:  [place]:  RN [facility] Agency:  [place]  Status of Service:   Completed, signed off  If discussed at [place] of Stay Meetings, dates discussed:    Additional Comments:  Guido Sander, RN 12/23/2015, 12:33 PM

## 2015-12-23 NOTE — Progress Notes (Addendum)
Cm spoke to patient at the bedside along with spouse on the phone who said that she was concerned about the patient discharging today because of the left leg weakness and N/T. RN, Marge, states that she called Dr. Tana Coast to advise that patient and family would like for her to come by and speak with the patient. Cm explained to patient that outpatient PT/OT orders sent to neurorehab center as patient did not qualify for [facility] PT/OT via fax and confirmation received. RN and SW with [facility] arranged for diabetes education and wound care/wound changes. Estrella Myrtle with [place] made aware and referral accepted. Ezra Sites with [facility] contacted and faxed Vac therapy Authorization form at which she said that she received it and was going to expedite it as she knows that patient is due for discharge home today with [facility] RN and SW. Cm called Dr. [name] regarding wound vac changes and per Ezra Sites with [facility], wound vac was not a Pro[facility]a wound vac and will need dressing changes M/W/F at which Dr. [name] was agreeable. No further CM needs communicated at this time and patient should be ready for discharge per MD orders once Wound vac placed.

## 2015-12-23 NOTE — Discharge Summary (Addendum)
Physician Discharge Summary   Patient ID: Shane [name] MRN: [id number] DOB/AGE: 1970/07/01 45 y.o.  Admit date: 12/16/2015 Discharge date: 12/23/2015  Primary Care Physician:  Minerva Ends, MD  Discharge Diagnoses:   . Cellulitis of left foot . Wound dehiscence . Foot infection . Left-sided weakness, numbness, possibly conversion disorder  . SIRS (systemic inflammatory response syndrome) (HCC) . Essential hypertension . Type 2 diabetes mellitus with vascular disease ([place]) . Chronic anemia   Consults:  Orthopedics Neurology, Dr. Shon Hale  Recommendations for Outpatient Follow-up:  1. Home health PT, OT, home health aide, RN 2. Wound VAC to stay on till 7/13 3. Please repeat CBC/BMET at next visit  DIET: Carb modified diet    Allergies:   Allergies  Allergen Reactions  . Eggs Or Egg-Derived Products Diarrhea  . Other Other (See Comments)    Red meat causes stomach pains, bloating and diarrhea  . Milk-Related Compounds Diarrhea and Other (See Comments)    Any dairy products  - diarrhea and bloating     DISCHARGE MEDICATIONS: Current Discharge Medication List    START taking these medications   Details  doxycycline (VIBRA-TABS) 100 MG tablet Take 1 tablet (100 mg total) by mouth 2 (two) times daily. X 2 weeks Qty: 28 tablet, Refills: 0      CONTINUE these medications which have CHANGED   Details  gabapentin (NEURONTIN) 100 MG capsule Take 1 capsule (100 mg total) by mouth 3 (three) times daily. Qty: 90 capsule, Refills: 3    metFORMIN (GLUCOPHAGE) 1000 MG tablet Take 1 tablet (1,000 mg total) by mouth 2 (two) times daily with a meal. Qty: 60 tablet, Refills: 5   Associated Diagnoses: Type 2 diabetes mellitus with other neurologic complication (HCC)    oxyCODONE-acetaminophen (PERCOCET/ROXICET) 5-325 MG tablet Take 1 tablet by mouth every 6 (six) hours as needed for severe pain. Qty: 10 tablet, Refills: 0      CONTINUE these medications which have NOT  CHANGED   Details  amLODipine (NORVASC) 5 MG tablet Take 1 tablet (5 mg total) by mouth daily. Qty: 90 tablet, Refills: 3   Associated Diagnoses: Essential hypertension    aspirin EC 325 MG tablet Take 1 tablet (325 mg total) by mouth daily. Qty: 30 tablet, Refills: 0   Associated Diagnoses: Transient cerebral ischemia, unspecified transient cerebral ischemia type    atorvastatin (LIPITOR) 80 MG tablet Take 1 tablet (80 mg total) by mouth every morning. 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 Qty: 30 tablet, Refills: 5   Associated Diagnoses: DMDD (disruptive mood dysregulation disorder) (HCC)    ferrous sulfate 325 (65 FE) MG tablet Take 1 tablet (325 mg total) by mouth 2 (two) times daily with a meal. Qty: 60 tablet, Refills: 5   Associated Diagnoses: Lower GI bleed; Anemia, unspecified anemia type    FLUoxetine (PROZAC) 10 MG tablet Take 10 mg by mouth daily.    glipiZIDE (GLUCOTROL) 10 MG tablet Take 1 tablet (10 mg total) by mouth 2 (two) times daily before a meal. Qty: 60 tablet, Refills: 5   Associated Diagnoses: Type 2 diabetes mellitus with other neurologic complication (HCC)    lisinopril (PRINIVIL,ZESTRIL) 40 MG tablet Take  1 tablet (40 mg total) by mouth daily. Qty: 30 tablet, Refills: 5   Associated Diagnoses: Essential hypertension    omeprazole (PRILOSEC) 20 MG capsule Take 1 capsule (20 mg total) by mouth daily. Qty: 30 capsule, Refills: 5   Associated Diagnoses: Gastroesophageal reflux disease without esophagitis    traZODone (DESYREL) 50 MG tablet Take 0.5-1 tablets (25-50 mg total) by mouth at bedtime as needed for sleep. Qty: 30 tablet, Refills: 3   Associated Diagnoses:  Insomnia      STOP taking these medications     amoxicillin-clavulanate (AUGMENTIN) 875-125 MG tablet      sulfamethoxazole-trimethoprim (BACTRIM DS,SEPTRA DS) 800-160 MG tablet          Brief H and P: For complete details please refer to admission H and P, but in briefEdwin [name] is a 45 y.o. male with diabetes mellitus type 2, hypertension, hyperlipidemia who was discharged on June 19 after being treated for left foot wound infection requiring second toe amputation and wound debridement and was again admitted and discharged 3 days ago for TIA presents to the ER for the second time with complaints of increasing swelling bloody discharge and numbness of the left foot for the last 3 days. Denies any weakness of upper or lower extremities. There is no foot drop. On exam there was mild discharge on the dressing on the left foot. Left foot looked swollen. Patient reported subjective feeling of fever chills. Patient has been admitted for further management. Patient states he did follow-up with orthopedic surgeon Dr. Randel Pigg office on Friday and had dressings changed. Patient has been empirically started on antibiotics. On arrival patient was hypotensive tachycardic which improved with IV fluid boluses for sepsis. Patient was admitted orthopedic was consulted who assessed the patient recommended wet-to-dry dressing changes as well as hydrotherapy, surgery.   Hospital Course:  SIRS (systemic inflammatory response syndrome) (HCC) resolved - Likely secondary to left foot infection. MRI of the left foot with marked improvement in subcutaneous abscess on the dorsum of the foot. Residual abscess deep to the incision.  - Orthopedics was consulted and patient underwent left foot dorsal excisional debridement, placement of antibiotic beads and irrigation wound VAC.  - Blood cultures negative so far, hence sepsis ruled out. Patient was placed on IV vancomycin and Zosyn, transitioned to oral  doxycycline.  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 Residual abscess deep to the incision. - Underwent debridement and wound VAC placement on 7/4 - Blood cultures negative so far. Patient to continue oral doxycycline. Outpatient follow-up with Dr. Randel Pigg scheduled  left lower extremity numbness, weakness: Per patient worsening today Patient complaining of numbness from foot to the calf region, per patient worsening after the surgery. He was recently admitted with TIA and with left-sided numbness and weakness and was discharged on 6/27. MRI of the brain 7/2 was negative for stroke. - MRI of the lumbar spine 7/5 showed mild disc degeneration at L4-5 with mild foraminal narrowing bilaterally, no disc protrusion or spinal stenosis - Patient had recent ABIs done 12/02/2015 which are within normal limits at rest. - Doppler ultrasound of the LLE negative for DVT - Appreciate neurology (Dr Shon Hale) recommendations, feels that patient's symptoms of left lower extremity numbness and weakness are possible conversion disorder, recommended physical therapy. MRI of the brain was negative for any acute CVA. I again discussed with Dr Shon Hale today prior to the discharge who recommended physical  therapy and no further neurology workup. Per Dr. Shon Hale, patient's neurological examination was inconsistent and strongly feels that his weakness is nonphysiologic and likely conversion disorder. Per Dr. Shon Hale, patient had positive Hoover's sign on the left, strong left hip extension when raising the right lower extremity off the bed. He did not move the left lower extremity to command however actively resists on passive movements of left lower extremity. Explained the neurology recommendations to the patient who understands.  hypertension Continue outpatient antihypertensives  diabetes mellitus type 2 Hemoglobin  A1c is 8.3. Continue metformin, glipizide  history of TIA Continue aspirin, statin  chronic anemia Follow H&H outpatient.  peripheral neuropathy Continue Neurontin, increased to 100 mg 3 times a day   hyperlipidemia Continue statin.   Day of Discharge BP 142/84 mmHg  Pulse 95  Temp(Src) 97.7 F (36.5 C) (Oral)  Resp 18  Ht 5\' 8"  (1.727 m)  Wt 76.204 kg (168 lb)  BMI 25.55 kg/m2  SpO2 100%  Physical Exam: General: Alert and awake oriented x3 not in any acute distress. HEENT: anicteric sclera, pupils reactive to light and accommodation CVS: S1-S2 clear no murmur rubs or gallops Chest: clear to auscultation bilaterally, no wheezing rales or rhonchi Abdomen: soft nontender, nondistended, normal bowel sounds Extremities: no cyanosis, clubbing or edema noted bilaterally, left foot wound VAC    The results of significant diagnostics from this hospitalization (including imaging, microbiology, ancillary and laboratory) are listed below for reference.    LAB RESULTS: Basic Metabolic Panel:  Recent Labs Lab 12/20/15 0531 12/21/15 0728  NA 137 138  K 4.1 4.2  CL 103 107  CO2 27 26  GLUCOSE 206* 215*  BUN 15 13  CREATININE 1.10 1.08  CALCIUM 9.0 9.4   Liver Function Tests:  Recent Labs Lab 12/16/15 1515 12/21/15 0728  AST 20 13*  ALT 24 16*  ALKPHOS 48 46  BILITOT 0.4 0.2*  PROT 7.2 6.4*  ALBUMIN 3.7 3.2*   No results for input(s): LIPASE, AMYLASE in the last 168 hours. No results for input(s): AMMONIA in the last 168 hours. CBC:  Recent Labs Lab 12/16/15 1515  12/20/15 0531 12/21/15 0728  WBC 7.4  < > 8.0 7.4  NEUTROABS 5.0  --   --   --   HGB 10.7*  < > 10.3* 10.1*  HCT 33.9*  < > 32.6* 31.8*  MCV 95.5  < > 96.2 94.1  PLT 442*  < > 340 315  < > = values in this interval not displayed. Cardiac Enzymes: No results for input(s): CKTOTAL, CKMB, CKMBINDEX, TROPONINI in the last 168 hours. BNP: Invalid input(s): POCBNP CBG:  Recent Labs Lab  12/23/15 0817 12/23/15 1204  GLUCAP 240* 95    Significant Diagnostic Studies:  Dg Chest 2 View  12/16/2015  CLINICAL DATA:  Toe amputation recently.  Possible sepsis. EXAM: CHEST  2 VIEW COMPARISON:  12/11/2015 FINDINGS: Lungs are adequately inflated without focal consolidation, effusion or pneumothorax. Cardiomediastinal silhouette and remainder of the exam is unchanged. IMPRESSION: No acute disease. Electronically Signed   By: Marin Olp M.D.   On: 12/16/2015 18:15   Mr Foot Left Wo Contrast  12/17/2015  CLINICAL DATA:  Cellulitis of the left foot. Drainage of an abscess on December 02, 2015. EXAM: MRI OF THE LEFT FOOT WITHOUT CONTRAST TECHNIQUE: Multiplanar, multisequence MR imaging was performed. No intravenous contrast was administered. COMPARISON:  Radiographs dated 12/16/2015 and MRI dated 12/01/2015 FINDINGS: There has been marked reduction in the size of the  subcutaneous abscess on the dorsum of the left foot. The abscess now measures approximately 4.3 cm in width and 3.9 cm in length and approximately 6 mm in depth. This communicates with the soft tissue wound on the dorsum of the foot which is centered over the abscess. Marked reduction in the subcutaneous edema on the dorsum of the foot. There is no osteomyelitis or joint effusions.  No myositis. IMPRESSION: Marked improvement in the subcutaneous abscess on the dorsum of the foot. Residual abscess deep to the incision as described. Electronically Signed   By: Lorriane Shire M.D.   On: 12/17/2015 12:16   Dg Foot Complete Left  12/16/2015  CLINICAL DATA:  Left foot numbness.  Rule out osteomyelitis EXAM: LEFT FOOT - COMPLETE 3+ VIEW COMPARISON:  12/14/2015 FINDINGS: Amputation of the great toe at the level of the MTP joint. Amputation of the second toe at the level of the distal aspect of the proximal phalanx. Negative for osteomyelitis. No fracture or arthropathy. No change from the recent study. Arterial calcification. Wound on the dorsum of  the foot. IMPRESSION: No interval change.  Negative for osteomyelitis. Electronically Signed   By: Franchot Gallo M.D.   On: 12/16/2015 19:11    MRI BRAIN 12/23/15 COMPARISON: Brain MRI 12/11/2015, and earlier.  FINDINGS: Major intracranial vascular flow voids are stable. No restricted diffusion to suggest acute infarction. No midline shift, mass effect, evidence of mass lesion, ventriculomegaly, extra-axial collection or acute intracranial hemorrhage. Cervicomedullary junction and pituitary are within normal limits. Cerebral volume is normal. Pearline Cables and white matter signal remains normal. No chronic cerebral blood products.  Visible internal auditory structures appear normal. Visualized paranasal sinuses and mastoids are stable and well pneumatized. Negative orbit and scalp soft tissues. Negative visualized cervical spine. Normal visible bone marrow signal.  IMPRESSION: Stable and normal noncontrast MRI appearance of the brain.  Disposition and Follow-up: Discharge Instructions    Diet - low sodium heart healthy    Complete by:  As directed      Discharge instructions    Complete by:  As directed   Needs wound vac until next Thursday. Please follow up with [name] in 1 week.     Increase activity slowly    Complete by:  As directed             DISPOSITION: Home with home physical therapy   DISCHARGE FOLLOW-UP Follow-up Information    Follow up with [facility] On 12/28/2015.   Why:  [facility] appointment on 12/28/15 at 11:15 am with Dr. Jarold Song.   Contact information:   [address] [place] [phone]      Follow up with Meredith Pel, MD On 12/27/2015.   Specialty:  Orthopedic Surgery   Why:  for hospital follow-up///Appointment with Dr. Marlou Sa is on 12/27/15 at 10:15am   Contact information:   [place] [address] [phone]       Follow up with [facility].   Why:  Home health for registered nurse and social work.   Contact information:   [address] [place]  [address] [phone]        Time spent on Discharge: 40 minutes  Signed:   RAI,RIPUDEEP M.D. [facility] 12/23/2015, 2:47 PM Pager: [phone]

## 2015-12-23 NOTE — Progress Notes (Signed)
Spoke with patient, who stated he was "not comfortable with going home at this time" and asked that he be allowed to stay here until he is required to see his physician on Wednesday.  He is otherwise independent with his ADLs and does not require any special equipment.  He states he has "numbness" in his left leg from the hip down, but is able to ambulate on that leg.  Color is comparable to other extremity and pulses are strong.  Bilateral LE are warm to touch.  Denies pain.  Displays anxiety over prospect of having to care for wound vac himself at home, despite multiple resources available to him.  CM, MD, and wound vac representative in to speak with him.

## 2015-12-25 ENCOUNTER — Telehealth: Payer: Self-pay

## 2015-12-25 NOTE — Telephone Encounter (Signed)
Transitional [facility] Clinic Post-discharge Follow-Up Phone Call:  Date of Discharge: 12/23/2015 Principal Discharge Diagnosis(es): cellulitis left foot, wound dehiscence, SIRS, HTN, DM,  - type 2, chronic anemia Post-discharge Communication: (Clearly document all attempts clearly and date contact made)  - Call placed to the patient Call Completed: Yes                   With Whom: Patient Interpreter Needed:No     Please check all that apply:  X  Patient is knowledgeable of his/her condition(s) and/or treatment. ? Patient is caring for self at home.  X  Patient is receiving assist at home from family and/or caregiver. Family and/or caregiver is knowledgeable of patient's condition(s) and/or treatment. - He stated that he has help from his wife as needed and she is aware of his medical needs.  X  Patient is receiving home health services. If so, name of agency. - [facility]  - nurse: [name]. She saw the patient today for a VAC dressing change. The VAC is to be in place until 12/28/15.      Medication Reconciliation:  X  Medication list reviewed with patient. - he noted that his medications were reviewed by the home health nurse, [name]. He did not report any need to review the medication list at this time.  X  Patient obtained all discharge medications. If not, why? He stated that he has all of his medications including the doxycycline. He confirmed that he understands the current orders for the metformin and gabapentin. He stated that he has enough prozac at this time but will need to contact [facility] about obtaining a refill.   He said that she has the percocet but has not needed it as he does not " feel any pain."  He said that he has been checking his blood sugars and they have been " good." He stated that his blood sugars are usually "93" with a recent high of 150.    Activities of Daily Living:  ? Independent X  Needs assist (describe; ? home DME used) he [facility] 3:[facility]  and also stated that he has his walker and a wheelchair that he is borrowing from a friend.  He noted that he was getting weak using the walker and his right foot was getting tired and starting to swell as he used the walker and is not bearing weight on his left foot. He said that it is easier to use the wheelchair.  He noted that he has enough food in the home.  ? [facility] (describe, ? home DME used)   Community resources in place for patient:  ? None  X  Home Health/Home DME - [facility] - RN and SW have been ordered.  ? Assisted Living ? Support Group          Patient Education: Instructed about the signs and symptoms of infection and possible complications of his wound. He stated that he is feeling " good" but has concerns about his left foot. He noted that 3 toenails feel off of the left foot since he has been home and he has no feeling in that foot. He said that he reported these concerns to [place], [employer] and he has reported the lack of feeling  in his foot to the physicians when he was in the hospital. He said that he is concerned that he will end up in the hospital again as "this is how it started before." referring  to the cellulitus, surgeries and hospitalization. He said that the home health nurse, [name], changed the wound VAC dressing today and stated that the wound was " looking good."  He said that that the left foot remains swollen but there is no sign of infection. He stated that he is very frustrated that he still does not have any feeling in the left foot and was evaluated by ortho as well as neuro when he was in the hospital. He said that [facility] took pictures of the wound and called and left a message for [name] this morning. He again verbalized his concerns about the loss of feeling in his foot. This CM encouraged him to call [name] himself to report his symptoms/ concerns. He stated that he would call [name] and he also stated that he understood when he would need to seek medical attention  in the ED. Informed him that this CM would call [name], RN to inquire about her visit with the patient this morning and check if she has heard from [name] office.         Questions/Concerns discussed: Reminded him that he has an appointment at the [facility] on 12/28/15 @ 1115.  He stated that he was very appreciative of the call. Again encouraged him to contact [name] office with any questions about his surgery/wound.

## 2015-12-25 NOTE — Telephone Encounter (Signed)
Call placed to [facility] and spoke to [facility], nurse who saw the patient this morning. She said that she is aware of the patient's complaint of lack of feeling in his left foot and the fact that he lost 3 toenails since coming home. She noted that the wound shows no signs of infection , no redness. She did state that it remains swollen. She noted that she left a message for [name] this morning and will she will call the patient back after hearing from the doctor.

## 2015-12-27 ENCOUNTER — Telehealth: Payer: Self-pay

## 2015-12-27 MED FILL — ATORVASTATIN 80 MG TABLET: 80 | 30 days supply | Qty: 30 | Fill #8

## 2015-12-27 MED FILL — FERROUS SULFATE 325 MG TAB: 325 (65 FE) | 30 days supply | Qty: 60 | Fill #1

## 2015-12-27 MED FILL — SSD 1% CREAM: 1 | 30 days supply | Qty: 400 | Fill #0

## 2015-12-27 MED FILL — OMEPRAZOLE DR 20 MG CAPSULE: 20 | 30 days supply | Qty: 30 | Fill #4

## 2015-12-27 MED FILL — AMLODIPINE BESYLATE 5 MG TA: 5 | 30 days supply | Qty: 30 | Fill #3

## 2015-12-27 NOTE — Telephone Encounter (Signed)
Call placed to the patient and confirmed his appointment for tomorrow,12/28/15 @ 1115 at the [facility].  He also confirmed that he has transportation to the clinic.  Reminded him to bring all of his medications and his blood sugar log  to his appointment for the doctor to review and he verbalized understanding.   He stated that he saw [name] today and the [facility] was removed. He noted that he was instructed how to do the wound care going forward and stated that he dropped a prescription off at [facility] for " cream " that is to be applied to the wound daily. He also stated that he will need to obtain gauze dressings. He did now know the name of the cream that was ordered.   He reported that overall he is " doing okay."  He said that the numbness of his foot has not improved and it was not discussed at the appointment.  The patient stated that the nerves in his foot are just " too damaged."  He said that he will follow up with [name] in 3 weeks. He also reported that he will need to contact [facility] to pick up the wound VAC and stated that they will not be providing nursing services now that the wound VAC has been removed.   No other problems/questions reported at this time.

## 2015-12-27 NOTE — Telephone Encounter (Signed)
Call placed to [facility] and spoke to [place], the patient's nurse. She confirmed that the orders for RN services are 2x/week x 1 week and 3x/week x 8 weeks She stated that they can continue to provide nursing services as long as they have orders for wound care from the doctor. She also noted that they will be able to provide wound care supplies while they are seeing him.

## 2015-12-28 ENCOUNTER — Telehealth: Payer: Self-pay | Admitting: *Deleted

## 2015-12-28 ENCOUNTER — Ambulatory Visit: Payer: MEDICAID | Attending: Family Medicine | Admitting: Family Medicine

## 2015-12-28 ENCOUNTER — Inpatient Hospital Stay: Payer: Self-pay | Admitting: Family Medicine

## 2015-12-28 VITALS — BP 108/70 | HR 83 | Temp 98.0°F | Resp 18 | Ht 68.0 in | Wt 159.0 lb

## 2015-12-28 DIAGNOSIS — R208 Other disturbances of skin sensation: Secondary | ICD-10-CM

## 2015-12-28 DIAGNOSIS — L0291 Cutaneous abscess, unspecified: Secondary | ICD-10-CM

## 2015-12-28 DIAGNOSIS — R200 Anesthesia of skin: Secondary | ICD-10-CM

## 2015-12-28 DIAGNOSIS — L97529 Non-pressure chronic ulcer of other part of left foot with unspecified severity: Secondary | ICD-10-CM

## 2015-12-28 DIAGNOSIS — L0390 Cellulitis, unspecified: Secondary | ICD-10-CM

## 2015-12-28 DIAGNOSIS — E11621 Type 2 diabetes mellitus with foot ulcer: Secondary | ICD-10-CM

## 2015-12-28 DIAGNOSIS — E1100 Type 2 diabetes mellitus with hyperosmolarity without nonketotic hyperglycemic-hyperosmolar coma (NKHHC): Secondary | ICD-10-CM

## 2015-12-28 LAB — GLUCOSE, POCT (MANUAL RESULT ENTRY): POC Glucose: 189 mg/dl — AB (ref 70–99)

## 2015-12-28 MED ORDER — INSULIN GLARGINE 100 UNIT/ML SOLOSTAR PEN: 10.0000 [IU] | PEN_INJECTOR | Freq: Every day | SUBCUTANEOUS | Status: DC

## 2015-12-28 MED ORDER — INSULIN PEN NEEDLE 31G X 8 MM MISC: 1.0000 "application " | Freq: Every day | Status: DC

## 2015-12-28 NOTE — Telephone Encounter (Signed)
Patient verified DOB Patient is aware of appointment with vein and vascular of [facility] on 01/05/16 at 11:30am. Patient received the address and contact number as well. Patient had no further questions at this time.

## 2015-12-28 NOTE — Progress Notes (Signed)
Subjective:  Patient ID: Shane [name], male    DOB: 1971-05-22  Age: 45 y.o. MRN: [id number]  CC: Diabetes and Foot Ulcer   HPI Cordy Box presents for    1. L foot wound: HFU for amputation of L 2nd toe, also ID of L dorsal foot abscess. Numbness in L foot. No pain. Nails have fallen off of toes. He is followed closely by ortho. He continues daily wound changes. He continues doxycyline. He had LE venous doppler in hospital that was negative. He has b/l ABI that was normal with values >1.   2. DM2: compliant with medications. CBG 68 x 1 last week. No CBGs > 200.     Past Surgical History  Procedure Laterality Date  . Inguinal hernia repair Bilateral ~ 1983- ~ 1986  . Tonsillectomy  ~ 1985  . Circumcision    . Amputation Left 08/03/2015    Procedure: AMPUTATION LEFT GREAT TOE;  Surgeon: Newt Minion, MD;  Location: [place];  Service: Orthopedics;  Laterality: Left;  . I&d extremity Left 12/02/2015    Procedure: IRRIGATION AND DEBRIDEMENT OF FOOT; LEFT SECOND TOE AMPUTATION;  Surgeon: Meredith Pel, MD;  Location: [place];  Service: Orthopedics;  Laterality: Left;  . Amputation Left 12/02/2015    Procedure: amputation of left 2nd digit  . I&d extremity Left 12/19/2015    Procedure: I & D LEFT FOOT WITH BEADS ;  Surgeon: Meredith Pel, MD;  Location: [place];  Service: Orthopedics;  Laterality: Left;  . Application of wound vac Left 12/19/2015    Procedure: APPLICATION OF WOUND VAC;  Surgeon: Meredith Pel, MD;  Location: [place];  Service: Orthopedics;  Laterality: Left;    Outpatient Prescriptions Prior to Visit  Medication Sig Dispense Refill  . amLODipine (NORVASC) 5 MG tablet Take 1 tablet (5 mg total) by mouth daily. 90 tablet 3  . aspirin EC 325 MG tablet Take 1 tablet (325 mg total) by mouth daily. (Patient taking differently: Take 325 mg by mouth every morning. ) 30 tablet 0  . atorvastatin (LIPITOR) 80 MG tablet Take 1 tablet (80 mg total) by mouth every morning. 30  tablet 5  . carvedilol (COREG) 12.5 MG tablet TAKE 1 TABLET BY MOUTH 2 TIMES DAILY WITH A MEAL (Patient taking differently: TAKE 1 TABLET BY MOUTH IN THE MORNING.) 60 tablet 3  . divalproex (DEPAKOTE) 500 MG DR tablet Take 2 tablets (1,000 mg total) by mouth at bedtime. 30 tablet 5  . doxycycline (VIBRA-TABS) 100 MG tablet Take 1 tablet (100 mg total) by mouth 2 (two) times daily. X 2 weeks 28 tablet 0  . ferrous sulfate 325 (65 FE) MG tablet Take 1 tablet (325 mg total) by mouth 2 (two) times daily with a meal. (Patient taking differently: Take 325 mg by mouth daily with breakfast. ) 60 tablet 5  . FLUoxetine (PROZAC) 10 MG tablet Take 10 mg by mouth daily.    Marland Kitchen gabapentin (NEURONTIN) 100 MG capsule Take 1 capsule (100 mg total) by mouth 3 (three) times daily. 90 capsule 3  . glipiZIDE (GLUCOTROL) 10 MG tablet Take 1 tablet (10 mg total) by mouth 2 (two) times daily before a meal. (Patient taking differently: Take 10 mg by mouth daily before breakfast. ) 60 tablet 5  . lisinopril (PRINIVIL,ZESTRIL) 40 MG tablet Take 1 tablet (40 mg total) by mouth daily. 30 tablet 5  . metFORMIN (GLUCOPHAGE) 1000 MG tablet Take 1 tablet (1,000 mg total) by mouth  2 (two) times daily with a meal. 60 tablet 5  . omeprazole (PRILOSEC) 20 MG capsule Take 1 capsule (20 mg total) by mouth daily. 30 capsule 5  . oxyCODONE-acetaminophen (PERCOCET/ROXICET) 5-325 MG tablet Take 1 tablet by mouth every 6 (six) hours as needed for severe pain. 10 tablet 0  . traZODone (DESYREL) 50 MG tablet Take 0.5-1 tablets (25-50 mg total) by mouth at bedtime as needed for sleep. 30 tablet 3   No facility-administered medications prior to visit.    ROS Review of Systems  Constitutional: Positive for fatigue. Negative for fever, chills and unexpected weight change.  Eyes: Negative for visual disturbance.  Respiratory: Positive for cough and shortness of breath.   Cardiovascular: Positive for leg swelling. Negative for chest pain and  palpitations.  Gastrointestinal: Negative for nausea, vomiting, abdominal pain, diarrhea, constipation and blood in stool.  Endocrine: Negative for polydipsia, polyphagia and polyuria.  Musculoskeletal: Positive for arthralgias and gait problem. Negative for myalgias, back pain and neck pain.  Skin: Positive for wound. Negative for rash.  Allergic/Immunologic: Negative for immunocompromised state.  Hematological: Negative for adenopathy. Does not bruise/bleed easily.  Psychiatric/Behavioral: Negative for suicidal ideas and dysphoric mood. The patient is not nervous/anxious.     Objective:  BP 108/70 mmHg  Pulse 83  Temp(Src) 98 F (36.7 C) (Oral)  Resp 18  Ht 5\' 8"  (1.727 m)  Wt 159 lb (72.122 kg)  BMI 24.18 kg/m2  SpO2 99%  BP/Weight 12/28/2015 0000000 [id number]  Systolic BP 123XX123 [id number] -  Diastolic BP 70 84 -  Wt. (Lbs) 159 - 168  BMI 24.18 - 25.55    Physical Exam  Constitutional: He appears well-developed and well-nourished. No distress.  HENT:  Head: Normocephalic and atraumatic.  Neck: Normal range of motion. Neck supple.  Cardiovascular: Normal rate, regular rhythm, normal heart sounds and intact distal pulses.   Pulses:      Femoral pulses are 1+ on the right side, and 1+ on the left side.      Popliteal pulses are 1+ on the right side, and 1+ on the left side.       Dorsalis pedis pulses are 1+ on the right side, and 1+ on the left side.       Posterior tibial pulses are 1+ on the right side, and 1+ on the left side.  Pulmonary/Chest: Effort normal and breath sounds normal.  Musculoskeletal: He exhibits no edema.       Feet:  Neurological: He is alert.  Skin: Skin is warm and dry. No rash noted. No erythema.  Psychiatric: He has a normal mood and affect.    Lab Results  Component Value Date   HGBA1C 8.3* 12/11/2015    CBG 189 Assessment & Plan:  Elray was seen today for diabetes and foot ulcer.  Diagnoses and all orders for this visit:  Type 2 diabetes  mellitus with hyperosmolarity without coma, without long-term current use of insulin (HCC) -     Glucose (CBG) -     Insulin Glargine (LANTUS SOLOSTAR) 100 UNIT/ML Solostar Pen; Inject 10 Units into the skin daily at 10 pm. -     Insulin Pen Needle (B-D ULTRAFINE III SHORT PEN) 31G X 8 MM MISC; 1 application by Does not apply route daily.  Numbness of left foot -     Vitamin B12  Diabetic ulcer of left foot associated with type 2 diabetes mellitus ([place]) -     VAS [place] LOWER EXTREMITY ARTERIAL DUPLEX; Future  No orders of the defined types were placed in this encounter.    Follow-up: No Follow-up on file.   Boykin Nearing MD

## 2015-12-28 NOTE — Patient Instructions (Addendum)
Shane [name] was seen today for follow-up.  Diagnoses and all orders for this visit:  Type 2 diabetes mellitus with hyperosmolarity without coma, without long-term current use of insulin (HCC) -     Glucose (CBG) -     Insulin Glargine (LANTUS SOLOSTAR) 100 UNIT/ML Solostar Pen; Inject 10 Units into the skin daily at 10 pm. -     Insulin Pen Needle (B-D ULTRAFINE III SHORT PEN) 31G X 8 MM MISC; 1 application by Does not apply route daily.  Numbness of left foot -     Vitamin B12  Diabetic ulcer of left foot associated with type 2 diabetes mellitus ([place]) -     VAS [place] LOWER EXTREMITY ARTERIAL DUPLEX; Future    Arterial dopplers ordered Plan for vascular surgery referral if abnormal  Start lantus 10 U daily Decrease glipizide to 5 mg BID if you have low blood sugar < 70  Diabetes blood sugar goals  Fasting (in AM before breakfast, 8 hrs of no eating or drinking (except water or unsweetened coffee or tea): 90-110 2 hrs after meals: < 160,   No low sugars: nothing < 70   F/u in 4 weeks blood sugar review  Dr. Adrian Blackwater

## 2015-12-29 ENCOUNTER — Encounter: Payer: Self-pay | Admitting: Family Medicine

## 2015-12-29 ENCOUNTER — Other Ambulatory Visit: Payer: Self-pay | Admitting: Pharmacist

## 2015-12-29 LAB — VITAMIN B12: Vitamin B-12: 440 pg/mL (ref 200–1100)

## 2015-12-29 MED ORDER — GLUCOSE BLOOD VI STRP
ORAL_STRIP | Status: DC
Start: 2015-12-29 — End: 2016-06-13

## 2015-12-29 MED ORDER — TRUEPLUS LANCETS 28G MISC
Status: DC
Start: 2015-12-29 — End: 2016-06-13

## 2015-12-29 MED ORDER — TRUE METRIX METER W/DEVICE KIT
PACK | Status: DC
Start: 2015-12-29 — End: 2016-02-20

## 2015-12-29 MED FILL — TRUEplus LANCETS 28G MISC: 25 days supply | Qty: 100 | Fill #0

## 2015-12-29 MED FILL — !TRUE METRIX BLOOD GLUCOSE: 365 days supply | Qty: 1 | Fill #0

## 2015-12-29 MED FILL — ULTICARE PEN NDL 4MM 32G: 32G X 4 MM | 50 days supply | Qty: 50 | Fill #0

## 2015-12-29 MED FILL — !LANTUS SOLOSTAR 100UNITS/M: 100 | 30 days supply | Qty: 3 | Fill #0

## 2015-12-29 MED FILL — TRUE METRIX TEST STRIP: 25 days supply | Qty: 100 | Fill #0

## 2015-12-29 NOTE — Assessment & Plan Note (Signed)
S/p ID and amputation  Continue doxycycline Arterial duplex Continue home wound care

## 2015-12-29 NOTE — Assessment & Plan Note (Signed)
A: diabetes with A1c above goal and vascular complications P: Add lantus 10 U daily Decrease glipizide from 10 to 5 mg BID prn hypoglycemia

## 2015-12-30 ENCOUNTER — Inpatient Hospital Stay (HOSPITAL_COMMUNITY)
Admission: EM | Admit: 2015-12-30 | Discharge: 2016-01-02 | DRG: 638 | Disposition: A | Discharge status: Home / Self Care | Payer: Medicaid Other | Attending: Family Medicine | Admitting: Family Medicine

## 2015-12-30 ENCOUNTER — Emergency Department (HOSPITAL_COMMUNITY): Payer: Medicaid Other

## 2015-12-30 ENCOUNTER — Encounter (HOSPITAL_COMMUNITY): Payer: Self-pay

## 2015-12-30 ENCOUNTER — Inpatient Hospital Stay (HOSPITAL_COMMUNITY): Payer: Medicaid Other

## 2015-12-30 DIAGNOSIS — D500 Iron deficiency anemia secondary to blood loss (chronic): Secondary | ICD-10-CM | POA: Diagnosis present

## 2015-12-30 DIAGNOSIS — E1165 Type 2 diabetes mellitus with hyperglycemia: Secondary | ICD-10-CM | POA: Diagnosis present

## 2015-12-30 DIAGNOSIS — I110 Hypertensive heart disease with heart failure: Secondary | ICD-10-CM | POA: Diagnosis present

## 2015-12-30 DIAGNOSIS — R0609 Other forms of dyspnea: Secondary | ICD-10-CM | POA: Diagnosis present

## 2015-12-30 DIAGNOSIS — Z91012 Allergy to eggs: Secondary | ICD-10-CM | POA: Diagnosis not present

## 2015-12-30 DIAGNOSIS — I5032 Chronic diastolic (congestive) heart failure: Secondary | ICD-10-CM | POA: Diagnosis present

## 2015-12-30 DIAGNOSIS — E11621 Type 2 diabetes mellitus with foot ulcer: Secondary | ICD-10-CM | POA: Diagnosis present

## 2015-12-30 DIAGNOSIS — R0600 Dyspnea, unspecified: Secondary | ICD-10-CM

## 2015-12-30 DIAGNOSIS — E785 Hyperlipidemia, unspecified: Secondary | ICD-10-CM | POA: Diagnosis present

## 2015-12-30 DIAGNOSIS — Z7982 Long term (current) use of aspirin: Secondary | ICD-10-CM

## 2015-12-30 DIAGNOSIS — I10 Essential (primary) hypertension: Secondary | ICD-10-CM | POA: Diagnosis present

## 2015-12-30 DIAGNOSIS — Z89422 Acquired absence of other left toe(s): Secondary | ICD-10-CM | POA: Diagnosis not present

## 2015-12-30 DIAGNOSIS — L0291 Cutaneous abscess, unspecified: Secondary | ICD-10-CM | POA: Diagnosis present

## 2015-12-30 DIAGNOSIS — Z833 Family history of diabetes mellitus: Secondary | ICD-10-CM | POA: Diagnosis not present

## 2015-12-30 DIAGNOSIS — F419 Anxiety disorder, unspecified: Secondary | ICD-10-CM | POA: Diagnosis present

## 2015-12-30 DIAGNOSIS — Z91011 Allergy to milk products: Secondary | ICD-10-CM | POA: Diagnosis not present

## 2015-12-30 DIAGNOSIS — D649 Anemia, unspecified: Secondary | ICD-10-CM | POA: Diagnosis present

## 2015-12-30 DIAGNOSIS — T148 Other injury of unspecified body region: Secondary | ICD-10-CM

## 2015-12-30 DIAGNOSIS — R208 Other disturbances of skin sensation: Secondary | ICD-10-CM

## 2015-12-30 DIAGNOSIS — E118 Type 2 diabetes mellitus with unspecified complications: Secondary | ICD-10-CM

## 2015-12-30 DIAGNOSIS — Z8249 Family history of ischemic heart disease and other diseases of the circulatory system: Secondary | ICD-10-CM

## 2015-12-30 DIAGNOSIS — Z79899 Other long term (current) drug therapy: Secondary | ICD-10-CM | POA: Diagnosis not present

## 2015-12-30 DIAGNOSIS — L03116 Cellulitis of left lower limb: Secondary | ICD-10-CM

## 2015-12-30 DIAGNOSIS — G4733 Obstructive sleep apnea (adult) (pediatric): Secondary | ICD-10-CM | POA: Diagnosis present

## 2015-12-30 DIAGNOSIS — L02612 Cutaneous abscess of left foot: Secondary | ICD-10-CM | POA: Diagnosis present

## 2015-12-30 DIAGNOSIS — IMO0002 Reserved for concepts with insufficient information to code with codable children: Secondary | ICD-10-CM | POA: Diagnosis present

## 2015-12-30 DIAGNOSIS — F319 Bipolar disorder, unspecified: Secondary | ICD-10-CM | POA: Diagnosis present

## 2015-12-30 DIAGNOSIS — K219 Gastro-esophageal reflux disease without esophagitis: Secondary | ICD-10-CM | POA: Diagnosis present

## 2015-12-30 DIAGNOSIS — L0390 Cellulitis, unspecified: Secondary | ICD-10-CM

## 2015-12-30 DIAGNOSIS — Z794 Long term (current) use of insulin: Secondary | ICD-10-CM

## 2015-12-30 DIAGNOSIS — R209 Unspecified disturbances of skin sensation: Secondary | ICD-10-CM

## 2015-12-30 DIAGNOSIS — Z8673 Personal history of transient ischemic attack (TIA), and cerebral infarction without residual deficits: Secondary | ICD-10-CM

## 2015-12-30 DIAGNOSIS — B999 Unspecified infectious disease: Secondary | ICD-10-CM

## 2015-12-30 DIAGNOSIS — Z531 Procedure and treatment not carried out because of patient's decision for reasons of belief and group pressure: Secondary | ICD-10-CM | POA: Diagnosis present

## 2015-12-30 DIAGNOSIS — E11628 Type 2 diabetes mellitus with other skin complications: Principal | ICD-10-CM | POA: Diagnosis present

## 2015-12-30 DIAGNOSIS — L089 Local infection of the skin and subcutaneous tissue, unspecified: Secondary | ICD-10-CM | POA: Diagnosis present

## 2015-12-30 DIAGNOSIS — E1159 Type 2 diabetes mellitus with other circulatory complications: Secondary | ICD-10-CM

## 2015-12-30 DIAGNOSIS — T148XXA Other injury of unspecified body region, initial encounter: Secondary | ICD-10-CM

## 2015-12-30 DIAGNOSIS — E119 Type 2 diabetes mellitus without complications: Secondary | ICD-10-CM

## 2015-12-30 DIAGNOSIS — L97509 Non-pressure chronic ulcer of other part of unspecified foot with unspecified severity: Secondary | ICD-10-CM

## 2015-12-30 LAB — CBC
HCT: 31 % — ABNORMAL LOW (ref 39.0–52.0)
HEMOGLOBIN: 9.7 g/dL — AB (ref 13.0–17.0)
MCH: 29.8 pg (ref 26.0–34.0)
MCHC: 31.3 g/dL (ref 30.0–36.0)
MCV: 95.4 fL (ref 78.0–100.0)
PLATELETS: 235 10*3/uL (ref 150–400)
RBC: 3.25 MIL/uL — ABNORMAL LOW (ref 4.22–5.81)
RDW: 13.2 % (ref 11.5–15.5)
WBC: 8.1 10*3/uL (ref 4.0–10.5)

## 2015-12-30 LAB — COMPREHENSIVE METABOLIC PANEL
ALBUMIN: 3.6 g/dL (ref 3.5–5.0)
ALK PHOS: 59 U/L (ref 38–126)
ALT: 17 U/L (ref 17–63)
ANION GAP: 7 (ref 5–15)
AST: 20 U/L (ref 15–41)
BILIRUBIN TOTAL: 0.5 mg/dL (ref 0.3–1.2)
BUN: 18 mg/dL (ref 6–20)
CALCIUM: 9 mg/dL (ref 8.9–10.3)
CO2: 24 mmol/L (ref 22–32)
CREATININE: 1.1 mg/dL (ref 0.61–1.24)
Chloride: 106 mmol/L (ref 101–111)
GFR calc Af Amer: >60 mL/min (ref >60)
GFR calc non Af Amer: >60 mL/min (ref >60)
GLUCOSE: 194 mg/dL — AB (ref 65–99)
Potassium: 4.4 mmol/L (ref 3.5–5.1)
Sodium: 137 mmol/L (ref 135–145)
TOTAL PROTEIN: 6.7 g/dL (ref 6.5–8.1)

## 2015-12-30 LAB — GLUCOSE, CAPILLARY: Glucose-Capillary: 126 mg/dL — ABNORMAL HIGH (ref 65–99)

## 2015-12-30 LAB — I-STAT TROPONIN, ED: TROPONIN I, POC: 0 ng/mL (ref 0.00–0.08)

## 2015-12-30 LAB — CG4 I-STAT (LACTIC ACID): Lactic Acid, Venous: 1.05 mmol/L (ref 0.5–1.9)

## 2015-12-30 MED ORDER — ACETAMINOPHEN 325 MG PO TABS: 650.0000 mg | ORAL_TABLET | Freq: Four times a day (QID) | ORAL | Status: DC | PRN

## 2015-12-30 MED ORDER — GABAPENTIN 100 MG PO CAPS
100.0000 mg | ORAL_CAPSULE | Freq: Three times a day (TID) | ORAL | Status: DC
  Administered 2015-12-30 – 2016-01-02 (×8): 100 mg via ORAL
  Filled 2015-12-30 (×10): qty 1

## 2015-12-30 MED ORDER — ATORVASTATIN CALCIUM 80 MG PO TABS
80.0000 mg | ORAL_TABLET | Freq: Every morning | ORAL | Status: DC
  Administered 2015-12-31 – 2016-01-02 (×3): 80 mg via ORAL
  Filled 2015-12-30 (×4): qty 1

## 2015-12-30 MED ORDER — FLUOXETINE HCL 10 MG PO CAPS
10.0000 mg | ORAL_CAPSULE | Freq: Every day | ORAL | Status: DC
  Administered 2015-12-31 – 2016-01-02 (×3): 10 mg via ORAL
  Filled 2015-12-30 (×3): qty 1

## 2015-12-30 MED ORDER — VANCOMYCIN HCL IN DEXTROSE 1-5 GM/200ML-% IV SOLN
1000.0000 mg | Freq: Two times a day (BID) | INTRAVENOUS | Status: DC
  Administered 2015-12-31 – 2016-01-02 (×5): 1000 mg via INTRAVENOUS
  Filled 2015-12-30 (×7): qty 200

## 2015-12-30 MED ORDER — ACETAMINOPHEN 650 MG RE SUPP: 650.0000 mg | Freq: Four times a day (QID) | RECTAL | Status: DC | PRN

## 2015-12-30 MED ORDER — VANCOMYCIN HCL 10 G IV SOLR
1500.0000 mg | INTRAVENOUS | Status: AC
  Administered 2015-12-30: 1500 mg via INTRAVENOUS
  Filled 2015-12-30: qty 1500

## 2015-12-30 MED ORDER — CARVEDILOL 12.5 MG PO TABS
12.5000 mg | ORAL_TABLET | Freq: Two times a day (BID) | ORAL | Status: DC
Start: 2015-12-31 — End: 2016-01-03
  Administered 2015-12-31 – 2016-01-02 (×6): 12.5 mg via ORAL
  Filled 2015-12-30 (×6): qty 1

## 2015-12-30 MED ORDER — TRAZODONE HCL 50 MG PO TABS: 25.0000 mg | ORAL_TABLET | Freq: Every evening | ORAL | Status: DC | PRN

## 2015-12-30 MED ORDER — INSULIN GLARGINE 100 UNIT/ML ~~LOC~~ SOLN
5.0000 [IU] | Freq: Every day | SUBCUTANEOUS | Status: DC
  Administered 2015-12-30 – 2016-01-01 (×3): 5 [IU] via SUBCUTANEOUS
  Filled 2015-12-30 (×4): qty 0.05

## 2015-12-30 MED ORDER — HYDROCODONE-ACETAMINOPHEN 5-325 MG PO TABS
1.0000 | ORAL_TABLET | ORAL | Status: DC | PRN
  Administered 2015-12-30: 2 via ORAL
  Filled 2015-12-30 (×3): qty 2

## 2015-12-30 MED ORDER — FERROUS SULFATE 325 (65 FE) MG PO TABS
325.0000 mg | ORAL_TABLET | Freq: Every day | ORAL | Status: DC
  Filled 2015-12-30: qty 1

## 2015-12-30 MED ORDER — METRONIDAZOLE IN NACL 5-0.79 MG/ML-% IV SOLN
500.0000 mg | Freq: Three times a day (TID) | INTRAVENOUS | Status: DC
  Administered 2015-12-31 – 2016-01-02 (×8): 500 mg via INTRAVENOUS
  Filled 2015-12-30 (×12): qty 100

## 2015-12-30 MED ORDER — DEXTROSE 5 % IV SOLN
2.0000 g | Freq: Two times a day (BID) | INTRAVENOUS | Status: DC
  Administered 2015-12-31 – 2016-01-02 (×6): 2 g via INTRAVENOUS
  Filled 2015-12-30 (×8): qty 2

## 2015-12-30 MED ORDER — SODIUM CHLORIDE 0.9 % IV SOLN
INTRAVENOUS | Status: AC
  Administered 2015-12-31: 50 mL/h via INTRAVENOUS

## 2015-12-30 MED ORDER — ONDANSETRON 4 MG PO TBDP
4.0000 mg | ORAL_TABLET | Freq: Once | ORAL | Status: AC | PRN
  Administered 2015-12-30: 4 mg via ORAL

## 2015-12-30 MED ORDER — INSULIN ASPART 100 UNIT/ML ~~LOC~~ SOLN
0.0000 [IU] | SUBCUTANEOUS | Status: DC
  Administered 2015-12-30: 1 [IU] via SUBCUTANEOUS
  Administered 2015-12-31: 5 [IU] via SUBCUTANEOUS
  Administered 2015-12-31: 2 [IU] via SUBCUTANEOUS
  Administered 2015-12-31: 5 [IU] via SUBCUTANEOUS
  Administered 2015-12-31: 3 [IU] via SUBCUTANEOUS

## 2015-12-30 MED ORDER — ONDANSETRON HCL 4 MG/2ML IJ SOLN: 4.0000 mg | Freq: Four times a day (QID) | INTRAMUSCULAR | Status: DC | PRN

## 2015-12-30 MED ORDER — ONDANSETRON 4 MG PO TBDP
ORAL_TABLET | ORAL | Status: AC
  Filled 2015-12-30: qty 1

## 2015-12-30 MED ORDER — ONDANSETRON HCL 4 MG PO TABS: 4.0000 mg | ORAL_TABLET | Freq: Four times a day (QID) | ORAL | Status: DC | PRN

## 2015-12-30 MED ORDER — PANTOPRAZOLE SODIUM 40 MG PO TBEC
40.0000 mg | DELAYED_RELEASE_TABLET | Freq: Every day | ORAL | Status: DC
  Administered 2015-12-31 – 2016-01-02 (×3): 40 mg via ORAL
  Filled 2015-12-30 (×4): qty 1

## 2015-12-30 MED ORDER — FLUOXETINE HCL 20 MG PO TABS
10.0000 mg | ORAL_TABLET | ORAL | Status: DC
  Filled 2015-12-30: qty 1

## 2015-12-30 MED ORDER — DIVALPROEX SODIUM ER 500 MG PO TB24
1000.0000 mg | ORAL_TABLET | Freq: Every day | ORAL | Status: DC
  Administered 2015-12-30 – 2016-01-01 (×3): 1000 mg via ORAL
  Filled 2015-12-30 (×4): qty 2

## 2015-12-30 MED ORDER — LISINOPRIL 40 MG PO TABS
40.0000 mg | ORAL_TABLET | ORAL | Status: DC
  Administered 2015-12-31 – 2016-01-02 (×3): 40 mg via ORAL
  Filled 2015-12-30 (×3): qty 1

## 2015-12-30 NOTE — Progress Notes (Signed)
Attempted report x 1. Please call [phone]. Thanks.

## 2015-12-30 NOTE — H&P (Signed)
Shane [name] [id number] DOB: 1971-03-24 DOA: 12/30/2015     PCP: Minerva Ends, MD   Outpatient Specialists: [facility]  Patient coming from: home Lives   With family    Chief Complaint: left foot wound infection  HPI: Shane [name] is a 45 y.o. male with medical history significant of DM 2, chronic left foot wound, hypertension, anemia, left side left leg numbness and weakness thought to  be secondary to conversion disorder, OSA not on C Pap, diastolic dysfunction, TIA, hyperlipidemia and anxiety    Presented with bleeding from surgical site his wound VAC has been removed on 12 July Complaining of some nausea vomiting chills and fever up to 102 at home. He endorses dyspnea on exertion ever since his discharge but no chest pain.  Patient with With known left foot diabetic ulcer status post amputation of second 2 and I&D of left dorsal foot abscess followed closely by orthopedics Dr. Marlou Sa. Has been recently admitted currently on doxycycline he had had workup in the past including penis Dopplers which were negative for DVT and bilateral ABI which were normal.> 1  blood cultures have been negative patient had 20 VAC placed on July 4 in sinus been discontinued. Patient had developed progressive numbness of his foot which extended all over up to his hip. He has undergone MRI which showed no evidence of herniated disc or nerve root compression. MRI of the brain was unremarkable he was seen in consult by neurology felt that was secondary to conversion disorder with inconsistent physical exam   IN ER: Afebrile heart rate 87 blood pressure 141/84 WBC 8.1 hemoglobin 9.7 creatinine 1.1 glucose 194 Plain film showing amputation of great toe and distal aspect of second toe diffuse soft tissue swelling and soft tissue gas in the dorsal foot may represent abscess Patient was started on cefepime and Flagyl and vancomycin   Assessment discussed by EER provider with orthopedics will see  patient in consult in AM Hospitalist was called for admission for diabetic foot ulcer  Review of Systems:    Pertinent positives include:  Fevers, chills, fatigue, nausea, foot pain and swelling, dyspnea on exertion,  Constitutional:  No weight loss, night sweats,weight loss  HEENT:  No headaches, Difficulty swallowing,Tooth/dental problems,Sore throat,  No sneezing, itching, ear ache, nasal congestion, post nasal drip,  Cardio-vascular:  No chest pain, Orthopnea, PND, anasarca, dizziness, palpitations.no Bilateral lower extremity swelling  GI:  No heartburn, indigestion, abdominal pain, vomiting, diarrhea, change in bowel habits, loss of appetite, melena, blood in stool, hematemesis Resp:  no shortness of breath at rest. No No excess mucus, no productive cough, No non-productive cough, No coughing up of blood.No change in color of mucus.No wheezing. Skin:  no rash or lesions. No jaundice GU:  no dysuria, change in color of urine, no urgency or frequency. No straining to urinate.  No flank pain.  Musculoskeletal:  No joint pain or no joint swelling. No decreased range of motion. No back pain.  Psych:  No change in mood or affect. No depression or anxiety. No memory loss.  Neuro: no localizing neurological complaints, no tingling, no weakness, no double vision, no gait abnormality, no slurred speech, no confusion  As per HPI otherwise 10 point review of systems negative.   Past Medical History: Past Medical History  Diagnosis Date  . Hypertension     a. 08/2014 Admitted with hypertensive urgency.  . Chest pain     a. 2015 Reportedly normal stress test in [place].  Marland Kitchen  TIA (transient ischemic attack) 08/2014; 03/2015    a. 08/2014 in setting of hypertensive urgency.  . Anemia   . Refusal of blood transfusions as patient is Jehovah's Witness   . Hyperlipidemia   . Chronic diastolic CHF (congestive heart failure) (HCC)     a.03/2015 Echo: EF 55-60%, Gr 1 DD, mild MR, triv PR.  . Type  II diabetes mellitus ([place])   . Sleep apnea   . GERD (gastroesophageal reflux disease)   . Anxiety   . Depression   . Bipolar disorder [facility])    Past Surgical History  Procedure Laterality Date  . Inguinal hernia repair Bilateral ~ 1983- ~ 1986  . Tonsillectomy  ~ 1985  . Circumcision    . Amputation Left 08/03/2015    Procedure: AMPUTATION LEFT GREAT TOE;  Surgeon: Newt Minion, MD;  Location: [place];  Service: Orthopedics;  Laterality: Left;  . I&d extremity Left 12/02/2015    Procedure: IRRIGATION AND DEBRIDEMENT OF FOOT; LEFT SECOND TOE AMPUTATION;  Surgeon: Meredith Pel, MD;  Location: [place];  Service: Orthopedics;  Laterality: Left;  . Amputation Left 12/02/2015    Procedure: amputation of left 2nd digit  . I&d extremity Left 12/19/2015    Procedure: I & D LEFT FOOT WITH BEADS ;  Surgeon: Meredith Pel, MD;  Location: [place];  Service: Orthopedics;  Laterality: Left;  . Application of wound vac Left 12/19/2015    Procedure: APPLICATION OF WOUND VAC;  Surgeon: Meredith Pel, MD;  Location: [place];  Service: Orthopedics;  Laterality: Left;     Social History:  Ambulatory   Gilford Rile     reports that he has never smoked. He has never used smokeless tobacco. He reports that he does not drink alcohol or use illicit drugs.  Allergies:   Allergies  Allergen Reactions  . Eggs Or Egg-Derived Products Diarrhea  . Other Other (See Comments)    Red meat causes stomach pains, bloating and diarrhea  . Milk-Related Compounds Diarrhea and Other (See Comments)    Any dairy products  - diarrhea and bloating       Family History:    Family History  Problem Relation Age of Onset  . Hypertension Mother   . Diabetes Mother   . Hyperlipidemia Mother   . Heart disease Mother     s/p pacemaker  . Diabetes Father   . Hypertension Father   . Stroke Father   . Heart attack Father     first MI @ 78.  . Stroke Brother     Medications: Prior to Admission medications     Medication Sig Start Date End Date Taking? Authorizing Provider  aspirin EC 325 MG tablet Take 1 tablet (325 mg total) by mouth daily. Patient taking differently: Take 325 mg by mouth every morning.  09/04/15   Maren Reamer, MD  atorvastatin (LIPITOR) 80 MG tablet Take 1 tablet (80 mg total) by mouth every morning. 08/18/15   Josalyn Funches, MD  Blood Glucose Monitoring Suppl (TRUE METRIX METER) w/Device KIT Use as directed 12/29/15   Josalyn Funches, MD  carvedilol (COREG) 12.5 MG tablet TAKE 1 TABLET BY MOUTH 2 TIMES DAILY WITH A MEAL Patient taking differently: TAKE 1 TABLET BY MOUTH IN THE MORNING. 05/17/15   Josalyn Funches, MD  divalproex (DEPAKOTE) 500 MG DR tablet Take 2 tablets (1,000 mg total) by mouth at bedtime. 12/04/15   Venetia Maxon Rama, MD  doxycycline (VIBRA-TABS) 100 MG tablet Take 1 tablet (  100 mg total) by mouth 2 (two) times daily. X 2 weeks 12/22/15   Ripudeep Krystal Eaton, MD  ferrous sulfate 325 (65 FE) MG tablet Take 1 tablet (325 mg total) by mouth 2 (two) times daily with a meal. Patient taking differently: Take 325 mg by mouth daily with breakfast.  08/18/15   Boykin Nearing, MD  FLUoxetine (PROZAC) 10 MG tablet Take 10 mg by mouth daily.    Historical Provider, MD  gabapentin (NEURONTIN) 100 MG capsule Take 1 capsule (100 mg total) by mouth 3 (three) times daily. 12/23/15   Ripudeep Krystal Eaton, MD  glipiZIDE (GLUCOTROL) 10 MG tablet Take 1 tablet (10 mg total) by mouth 2 (two) times daily before a meal. Patient taking differently: Take 10 mg by mouth daily before breakfast.  08/18/15   Boykin Nearing, MD  glucose blood (TRUE METRIX BLOOD GLUCOSE TEST) test strip Use as instructed 12/29/15   Boykin Nearing, MD  Insulin Glargine (LANTUS SOLOSTAR) 100 UNIT/ML Solostar Pen Inject 10 Units into the skin daily at 10 pm. 12/28/15   Josalyn Funches, MD  Insulin Pen Needle (B-D ULTRAFINE III SHORT PEN) 31G X 8 MM MISC 1 application by Does not apply route daily. 12/28/15   Josalyn Funches, MD   lisinopril (PRINIVIL,ZESTRIL) 40 MG tablet Take 1 tablet (40 mg total) by mouth daily. 08/18/15   Boykin Nearing, MD  metFORMIN (GLUCOPHAGE) 1000 MG tablet Take 1 tablet (1,000 mg total) by mouth 2 (two) times daily with a meal. 12/22/15   Ripudeep K Rai, MD  omeprazole (PRILOSEC) 20 MG capsule Take 1 capsule (20 mg total) by mouth daily. 08/18/15   Josalyn Funches, MD  oxyCODONE-acetaminophen (PERCOCET/ROXICET) 5-325 MG tablet Take 1 tablet by mouth every 6 (six) hours as needed for severe pain. 12/22/15   Ripudeep Krystal Eaton, MD  traZODone (DESYREL) 50 MG tablet Take 0.5-1 tablets (25-50 mg total) by mouth at bedtime as needed for sleep. 11/21/15   Boykin Nearing, MD  TRUEPLUS LANCETS 28G MISC Use as directed 12/29/15   Boykin Nearing, MD    Physical Exam: Patient Vitals for the past 24 hrs:  BP Temp Temp src Pulse Resp SpO2 Height Weight  12/30/15 1815 141/84 mmHg - - 87 - 99 % - -  12/30/15 1745 111/74 mmHg - - 83 - 100 % - -  12/30/15 1732 121/82 mmHg - - 84 18 99 % - -  12/30/15 1730 121/82 mmHg - - - - - - -  12/30/15 1513 107/63 mmHg 97.6 F (36.4 C) Oral 88 18 100 % 5' 8"  (1.727 m) 78.926 kg (174 lb)    1. General:  in No Acute distress 2. Psychological: Alert and   Oriented 3. Head/ENT:    Dry Mucous Membranes                          Head Non traumatic, neck supple                           Poor Dentition 4. SKIN: normal  Skin turgor,  Skin clean Dry  Ulceration of the top of the foot with swelling noted    5. Heart: Regular rate and rhythm no Murmur, Rub or gallop 6. Lungs:   Clear to auscultation bilaterally, no wheezes or crackles   7. Abdomen: Soft, non-tender, Non distended 8. Lower extremities: no clubbing, cyanosis, or edema 9. Neurologically Grossly intact, moving all 4  extremities equally 10. MSK: Normal range of motion   body mass index is 26.46 kg/(m^2).  Labs on Admission:   Labs on Admission: I have personally reviewed following labs and imaging  studies  CBC:  Recent Labs Lab 12/30/15 1521  WBC 8.1  HGB 9.7*  HCT 31.0*  MCV 95.4  PLT 546   Basic Metabolic Panel:  Recent Labs Lab 12/30/15 1521  NA 137  K 4.4  CL 106  CO2 24  GLUCOSE 194*  BUN 18  CREATININE 1.10  CALCIUM 9.0   GFR: Estimated Creatinine Clearance: 81.2 mL/min (by C-G formula based on Cr of 1.1). Liver Function Tests:  Recent Labs Lab 12/30/15 1521  AST 20  ALT 17  ALKPHOS 59  BILITOT 0.5  PROT 6.7  ALBUMIN 3.6   No results for input(s): LIPASE, AMYLASE in the last 168 hours. No results for input(s): AMMONIA in the last 168 hours. Coagulation Profile: No results for input(s): INR, PROTIME in the last 168 hours. Cardiac Enzymes: No results for input(s): CKTOTAL, CKMB, CKMBINDEX, TROPONINI in the last 168 hours. BNP (last 3 results) No results for input(s): PROBNP in the last 8760 hours. HbA1C: No results for input(s): HGBA1C in the last 72 hours. CBG: No results for input(s): GLUCAP in the last 168 hours. Lipid Profile: No results for input(s): CHOL, HDL, LDLCALC, TRIG, CHOLHDL, LDLDIRECT in the last 72 hours. Thyroid Function Tests: No results for input(s): TSH, T4TOTAL, FREET4, T3FREE, THYROIDAB in the last 72 hours. Anemia Panel:  Recent Labs  12/28/15 1448  VITAMINB12 440   Urine analysis:    Component Value Date/Time   COLORURINE YELLOW 04/24/2015 1056   APPEARANCEUR CLEAR 04/24/2015 1056   LABSPEC 1.013 04/24/2015 1056   PHURINE 5.0 04/24/2015 1056   GLUCOSEU NEGATIVE 04/24/2015 1056   HGBUR MODERATE* 04/24/2015 1056   BILIRUBINUR NEGATIVE 04/24/2015 1056   KETONESUR NEGATIVE 04/24/2015 1056   PROTEINUR NEGATIVE 04/24/2015 1056   UROBILINOGEN 0.2 04/24/2015 1056   NITRITE NEGATIVE 04/24/2015 1056   LEUKOCYTESUR NEGATIVE 04/24/2015 1056   Sepsis Labs: @LABRCNTIP (procalcitonin:4,lacticidven:4) )No results found for this or any previous visit (from the past 240 hour(s)).     UA not obtained  Lab Results   Component Value Date   HGBA1C 8.3* 12/11/2015    Estimated Creatinine Clearance: 81.2 mL/min (by C-G formula based on Cr of 1.1).  BNP (last 3 results) No results for input(s): PROBNP in the last 8760 hours.   ECG REPORT Not obtained  Filed Weights   12/30/15 1513  Weight: 78.926 kg (174 lb)     Cultures:    Component Value Date/Time   SDES BLOOD LEFT ANTECUBITAL 12/16/2015 1555   SPECREQUEST BOTTLES DRAWN AEROBIC ONLY 10CC 12/16/2015 1555   CULT NO GROWTH 5 DAYS 12/16/2015 1555   REPTSTATUS 12/21/2015 FINAL 12/16/2015 1555     Radiological Exams on Admission: Dg Foot Complete Left  12/30/2015  CLINICAL DATA:  44 year old male with amputation of the left foot 3 weeks ago presenting with swelling. EXAM: LEFT FOOT - COMPLETE 3+ VIEW COMPARISON:  Left foot MRI dated 12/17/2015 FINDINGS: There is amputation of the great toe at the metatarsophalangeal joint. There is also amputation of the distal aspect of the proximal phalanx of the second toe. There is no acute fracture or dislocation. There is no bony erosions or periosteal reaction. No significant arthropathy. There is diffuse soft tissue swelling of the forefoot and over the first metatarsal head. On the lateral projection there is an area of  lucency in the soft tissues of the dorsum of the foot which may represent a skin ulcer or air within the soft tissue concerning for an abscess. Clinical correlation is recommended. No radiopaque foreign object identified. IMPRESSION: Amputation of the great toe and distal aspect of the second toe. Diffuse soft tissue swelling of the forefoot with an area of skin ulceration and soft tissue gas in the dorsum of the foot which may represent an abscess. Clinical correlation is recommended. Electronically Signed   By: Anner Crete M.D.   On: 12/30/2015 18:43    Chart has been reviewed   Assessment/Plan  45 y.o. male with medical history significant of DM 2, chronic left foot wound,  hypertension, anemia, left side left leg numbness and weakness thought to  be secondary to conversion disorder here with reccurent diabetic foot infection.     Orthopedics consult aware we'll see patient in the morning Present on Admission:  . Diabetic foot infection (HCC)/ Cellulitis and abscess, left foot - appreciate orthopedics consult. Defer to orthopedics regarding the need for MRI versus proceeding to debridement, make nothing by mouth post midnight.  . Anemia chronic patient is Jehovah's Witness continue to follow CBC, obtain anemia panel,  Diabetes mellitus with neurological complication - will order sliding scale hold by mouth medications, Lantus at 5 Hypertension - continue home medications Dyspnea likely secondary to symptomatic anemia and deconditioning no chest pain. Obtain chest x-ray start the workup History of left lower extremity numbness still reports intermittent numbness but she is able to ambulate with a walker. History of diastolic dysfunction currently appears to be euvolemic will check CXR given dyspnea Bipolar disorder continue home medications Other plan as per orders.  DVT prophylaxis:  SCD     Code Status:  FULL CODE as per patient   Jehovah's Witness no blood products  Family Communication:   Family  at  Bedside  plan of care was discussed with  Wife flora Ramero [phone]  Disposition Plan:    likely will need placement for rehabilitation                        Consults called: Orthopedics Dr. [name]  Admission status:    inpatient       Level of care    medical floor      I have spent a total of 57 min on this admission    Shane [name] 12/30/2015, 8:01 PM    [facility]  Pager [phone]   after 2 AM please page floor coverage PA If 7AM-7PM, please contact the day team taking care of the patient  [email or web address]  Password [email or web address]

## 2015-12-30 NOTE — Progress Notes (Signed)
ANTIBIOTIC CONSULT NOTE - INITIAL  Pharmacy Consult for vanc/cefepime Indication: cellulitis  Allergies  Allergen Reactions  . Eggs Or Egg-Derived Products Diarrhea  . Other Other (See Comments)    Red meat causes stomach pains, bloating and diarrhea  . Milk-Related Compounds Diarrhea and Other (See Comments)    Any dairy products  - diarrhea and bloating    Patient Measurements: Height: 5\' 8"  (172.7 cm) Weight: 174 lb (78.926 kg) IBW/kg (Calculated) : 68.4 Adjusted Body Weight:   Vital Signs: Temp: 97.6 F (36.4 C) (07/15 1513) Temp Source: Oral (07/15 1513) BP: 121/82 mmHg (07/15 1732) Pulse Rate: 84 (07/15 1732) Intake/Output from previous day:   Intake/Output from this shift:    Labs:  Recent Labs  12/30/15 1521  WBC 8.1  HGB 9.7*  PLT 235  CREATININE 1.10   Estimated Creatinine Clearance: 81.2 mL/min (by C-G formula based on Cr of 1.1). No results for input(s): VANCOTROUGH, VANCOPEAK, VANCORANDOM, GENTTROUGH, GENTPEAK, GENTRANDOM, TOBRATROUGH, TOBRAPEAK, TOBRARND, AMIKACINPEAK, AMIKACINTROU, AMIKACIN in the last 72 hours.   Microbiology: Recent Results (from the past 720 hour(s))  Culture, blood (x 2)     Status: None   Collection Time: 12/01/15  4:45 AM  Result Value Ref Range Status   Specimen Description BLOOD RIGHT ARM  Final   Special Requests IN PEDIATRIC BOTTLE 3CC  Final   Culture NO GROWTH 5 DAYS  Final   Report Status 12/06/2015 FINAL  Final  Culture, blood (x 2)     Status: None   Collection Time: 12/01/15  4:55 AM  Result Value Ref Range Status   Specimen Description BLOOD RIGHT HAND  Final   Special Requests IN PEDIATRIC BOTTLE 3CC  Final   Culture NO GROWTH 5 DAYS  Final   Report Status 12/06/2015 FINAL  Final  Aerobic/Anaerobic Culture (surgical/deep wound)     Status: None   Collection Time: 12/02/15  5:16 PM  Result Value Ref Range Status   Specimen Description WOUND  Final   Special Requests LEFT SECOND TOE  Final   Gram Stain    Final    RARE WBC PRESENT,BOTH PMN AND MONONUCLEAR NO ORGANISMS SEEN    Culture No growth aerobically or anaerobically.  Final   Report Status 12/07/2015 FINAL  Final  Blood Culture (routine x 2)     Status: None   Collection Time: 12/16/15  3:50 PM  Result Value Ref Range Status   Specimen Description BLOOD RIGHT ANTECUBITAL  Final   Special Requests BOTTLES DRAWN AEROBIC AND ANAEROBIC 5CC  Final   Culture NO GROWTH 5 DAYS  Final   Report Status 12/21/2015 FINAL  Final  Blood Culture (routine x 2)     Status: None   Collection Time: 12/16/15  3:55 PM  Result Value Ref Range Status   Specimen Description BLOOD LEFT ANTECUBITAL  Final   Special Requests BOTTLES DRAWN AEROBIC ONLY 10CC  Final   Culture NO GROWTH 5 DAYS  Final   Report Status 12/21/2015 FINAL  Final  MRSA PCR Screening     Status: None   Collection Time: 12/18/15 10:43 PM  Result Value Ref Range Status   MRSA by PCR NEGATIVE NEGATIVE Final    Comment:        The GeneXpert MRSA Assay (FDA approved for NASAL specimens only), is one component of a comprehensive MRSA colonization surveillance program. It is not intended to diagnose MRSA infection nor to guide or monitor treatment for MRSA infections.   Surgical pcr screen  Status: None   Collection Time: 12/19/15  7:03 AM  Result Value Ref Range Status   MRSA, PCR NEGATIVE NEGATIVE Final   Staphylococcus aureus NEGATIVE NEGATIVE Final    Comment:        The Xpert SA Assay (FDA approved for NASAL specimens in patients over 39 years of age), is one component of a comprehensive surveillance program.  Test performance has been validated by [facility] for patients greater than or equal to 2 year old. It is not intended to diagnose infection nor to guide or monitor treatment.     Medical History: Past Medical History  Diagnosis Date  . Hypertension     a. 08/2014 Admitted with hypertensive urgency.  . Chest pain     a. 2015 Reportedly normal stress  test in [place].  Marland Kitchen TIA (transient ischemic attack) 08/2014; 03/2015    a. 08/2014 in setting of hypertensive urgency.  . Anemia   . Refusal of blood transfusions as patient is [employer]   . Hyperlipidemia   . Chronic diastolic CHF (congestive heart failure) (HCC)     a.03/2015 Echo: EF 55-60%, Gr 1 DD, mild MR, triv PR.  . Type II diabetes mellitus ([place])   . Sleep apnea   . GERD (gastroesophageal reflux disease)   . Anxiety   . Depression   . Bipolar disorder ([place])     Medications:  Scheduled:  . ondansetron       Assessment: 45 yo who had a recent toe amputation due to diabetic foot infection. He also got wound debridement during the last admission. He has been on doxy after discharge. He presented here with bleeding from the site again with fever and chills. Empiric abx were ordered. D/w with Nicki Reaper, PA in the ED, we are going to use vanc/cefepime/flagyl instead of zosyn.   Goal of Therapy:  Vancomycin trough level 10-15 mcg/ml  Plan:   Vanc 1.5g IV x1 then 1g IV q12 Cefepime 2g IV q12 Flagyl 500mg  IV q8  Onnie Boer, PharmD Pager: [phone] 12/30/2015 6:29 PM

## 2015-12-30 NOTE — ED Notes (Signed)
Pt here c/o bleeding from surgical site. He had left foot surgery 2 weeks ago and had a wound vac removed on Wednesday and has been having bleeding at the site, n/v, chills, and fever per wife. Foot is in post-op boot, no obvious bleeding. Vital signs stable at this time.

## 2015-12-30 NOTE — ED Provider Notes (Signed)
CSN: [id number]     Arrival date & time 12/30/15  1506 History   First MD Initiated Contact with Patient 12/30/15 1731     Chief Complaint  Patient presents with  . Post-op Problem     (Consider location/radiation/quality/duration/timing/severity/associated sxs/prior Treatment) HPI Comments: Patient presents to the ED with a chief complaint of worsening foot swelling.  He has had several recent surgeries on his foot including amputations and I&D for abscess.  States that he had a wound vac on until earlier this week.  States that since having the wound vac removed he has had increased swelling and bleeding of the foot.  He reports fever to 102 at home with associated chills and nausea and vomiting.  He reports having associated throbbing pain.  The history is provided by the patient. No language interpreter was used.    Past Medical History  Diagnosis Date  . Hypertension     a. 08/2014 Admitted with hypertensive urgency.  . Chest pain     a. 2015 Reportedly normal stress test in [place].  Marland Kitchen TIA (transient ischemic attack) 08/2014; 03/2015    a. 08/2014 in setting of hypertensive urgency.  . Anemia   . Refusal of blood transfusions as patient is Jehovah's Witness   . Hyperlipidemia   . Chronic diastolic CHF (congestive heart failure) (HCC)     a.03/2015 Echo: EF 55-60%, Gr 1 DD, mild MR, triv PR.  . Type II diabetes mellitus ([place])   . Sleep apnea   . GERD (gastroesophageal reflux disease)   . Anxiety   . Depression   . Bipolar disorder [facility] [place])    Past Surgical History  Procedure Laterality Date  . Inguinal hernia repair Bilateral ~ 1983- ~ 1986  . Tonsillectomy  ~ 1985  . Circumcision    . Amputation Left 08/03/2015    Procedure: AMPUTATION LEFT GREAT TOE;  Surgeon: Newt Minion, MD;  Location: [place];  Service: Orthopedics;  Laterality: Left;  . I&d extremity Left 12/02/2015    Procedure: IRRIGATION AND DEBRIDEMENT OF FOOT; LEFT SECOND TOE AMPUTATION;  Surgeon: Meredith Pel, MD;   Location: [place];  Service: Orthopedics;  Laterality: Left;  . Amputation Left 12/02/2015    Procedure: amputation of left 2nd digit  . I&d extremity Left 12/19/2015    Procedure: I & D LEFT FOOT WITH BEADS ;  Surgeon: Meredith Pel, MD;  Location: [place];  Service: Orthopedics;  Laterality: Left;  . Application of wound vac Left 12/19/2015    Procedure: APPLICATION OF WOUND VAC;  Surgeon: Meredith Pel, MD;  Location: [place];  Service: Orthopedics;  Laterality: Left;   Family History  Problem Relation Age of Onset  . Hypertension Mother   . Diabetes Mother   . Hyperlipidemia Mother   . Heart disease Mother     s/p pacemaker  . Diabetes Father   . Hypertension Father   . Stroke Father   . Heart attack Father     first MI @ 59.  . Stroke Brother    Social History  Substance Use Topics  . Smoking status: Never Smoker   . Smokeless tobacco: Never Used  . Alcohol Use: No    Review of Systems  Musculoskeletal: Positive for joint swelling and arthralgias.  Skin: Positive for wound.  All other systems reviewed and are negative.     Allergies  Eggs or egg-derived products; Other; and Milk-related compounds  Home Medications   Prior to Admission medications  Medication Sig Start Date End Date Taking? Authorizing Provider  aspirin EC 325 MG tablet Take 1 tablet (325 mg total) by mouth daily. Patient taking differently: Take 325 mg by mouth every morning.  09/04/15   Maren Reamer, MD  atorvastatin (LIPITOR) 80 MG tablet Take 1 tablet (80 mg total) by mouth every morning. 08/18/15   Josalyn Funches, MD  Blood Glucose Monitoring Suppl (TRUE METRIX METER) w/Device KIT Use as directed 12/29/15   Josalyn Funches, MD  carvedilol (COREG) 12.5 MG tablet TAKE 1 TABLET BY MOUTH 2 TIMES DAILY WITH A MEAL Patient taking differently: TAKE 1 TABLET BY MOUTH IN THE MORNING. 05/17/15   Josalyn Funches, MD  divalproex (DEPAKOTE) 500 MG DR tablet Take 2 tablets (1,000 mg total) by mouth at  bedtime. 12/04/15   Venetia Maxon Rama, MD  doxycycline (VIBRA-TABS) 100 MG tablet Take 1 tablet (100 mg total) by mouth 2 (two) times daily. X 2 weeks 12/22/15   Ripudeep Krystal Eaton, MD  ferrous sulfate 325 (65 FE) MG tablet Take 1 tablet (325 mg total) by mouth 2 (two) times daily with a meal. Patient taking differently: Take 325 mg by mouth daily with breakfast.  08/18/15   Boykin Nearing, MD  FLUoxetine (PROZAC) 10 MG tablet Take 10 mg by mouth daily.    Historical Provider, MD  gabapentin (NEURONTIN) 100 MG capsule Take 1 capsule (100 mg total) by mouth 3 (three) times daily. 12/23/15   Ripudeep Krystal Eaton, MD  glipiZIDE (GLUCOTROL) 10 MG tablet Take 1 tablet (10 mg total) by mouth 2 (two) times daily before a meal. Patient taking differently: Take 10 mg by mouth daily before breakfast.  08/18/15   Boykin Nearing, MD  glucose blood (TRUE METRIX BLOOD GLUCOSE TEST) test strip Use as instructed 12/29/15   Boykin Nearing, MD  Insulin Glargine (LANTUS SOLOSTAR) 100 UNIT/ML Solostar Pen Inject 10 Units into the skin daily at 10 pm. 12/28/15   Josalyn Funches, MD  Insulin Pen Needle (B-D ULTRAFINE III SHORT PEN) 31G X 8 MM MISC 1 application by Does not apply route daily. 12/28/15   Josalyn Funches, MD  lisinopril (PRINIVIL,ZESTRIL) 40 MG tablet Take 1 tablet (40 mg total) by mouth daily. 08/18/15   Boykin Nearing, MD  metFORMIN (GLUCOPHAGE) 1000 MG tablet Take 1 tablet (1,000 mg total) by mouth 2 (two) times daily with a meal. 12/22/15   Ripudeep K Rai, MD  omeprazole (PRILOSEC) 20 MG capsule Take 1 capsule (20 mg total) by mouth daily. 08/18/15   Josalyn Funches, MD  oxyCODONE-acetaminophen (PERCOCET/ROXICET) 5-325 MG tablet Take 1 tablet by mouth every 6 (six) hours as needed for severe pain. 12/22/15   Ripudeep Krystal Eaton, MD  traZODone (DESYREL) 50 MG tablet Take 0.5-1 tablets (25-50 mg total) by mouth at bedtime as needed for sleep. 11/21/15   Boykin Nearing, MD  TRUEPLUS LANCETS 28G MISC Use as directed 12/29/15   Boykin Nearing, MD   BP 121/82 mmHg  Pulse 84  Temp(Src) 97.6 F (36.4 C) (Oral)  Resp 18  Ht 5' 8"  (1.727 m)  Wt 78.926 kg  BMI 26.46 kg/m2  SpO2 99% Physical Exam  Constitutional: He is oriented to person, place, and time. He appears well-developed and well-nourished.  HENT:  Head: Normocephalic and atraumatic.  Eyes: Conjunctivae and EOM are normal. Pupils are equal, round, and reactive to light. Right eye exhibits no discharge. Left eye exhibits no discharge. No scleral icterus.  Neck: Normal range of motion. Neck supple. No JVD  present.  Cardiovascular: Normal rate, regular rhythm and normal heart sounds.  Exam reveals no gallop and no friction rub.   No murmur heard. Pulmonary/Chest: Effort normal and breath sounds normal. No respiratory distress. He has no wheezes. He has no rales. He exhibits no tenderness.  Abdominal: Soft. He exhibits no distension and no mass. There is no tenderness. There is no rebound and no guarding.  No focal abdominal tenderness, no RLQ tenderness or pain at McBurney's point, no RUQ tenderness or Murphy's sign, no left-sided abdominal tenderness, no fluid wave, or signs of peritonitis   Musculoskeletal: Normal range of motion. He exhibits edema. He exhibits no tenderness.  As pictured below Left foot is very swollen, no obvious purulent discharge  Neurological: He is alert and oriented to person, place, and time.  Skin: Skin is warm and dry.  Open wound on dorsum of left foot as pictured below  Psychiatric: He has a normal mood and affect. His behavior is normal. Judgment and thought content normal.  Nursing note and vitals reviewed.   ED Course  Procedures (including critical care time) Results for orders placed or performed during the hospital encounter of 12/30/15  Comprehensive metabolic panel  Result Value Ref Range   Sodium 137 135 - 145 mmol/L   Potassium 4.4 3.5 - 5.1 mmol/L   Chloride 106 101 - 111 mmol/L   CO2 24 22 - 32 mmol/L   Glucose,  Bld 194 (H) 65 - 99 mg/dL   BUN 18 6 - 20 mg/dL   Creatinine, Ser 1.10 0.61 - 1.24 mg/dL   Calcium 9.0 8.9 - 10.3 mg/dL   Total Protein 6.7 6.5 - 8.1 g/dL   Albumin 3.6 3.5 - 5.0 g/dL   AST 20 15 - 41 U/L   ALT 17 17 - 63 U/L   Alkaline Phosphatase 59 38 - 126 U/L   Total Bilirubin 0.5 0.3 - 1.2 mg/dL   GFR calc non Af Amer >60 >60 mL/min   GFR calc Af Amer >60 >60 mL/min   Anion gap 7 5 - 15  CBC  Result Value Ref Range   WBC 8.1 4.0 - 10.5 K/uL   RBC 3.25 (L) 4.22 - 5.81 MIL/uL   Hemoglobin 9.7 (L) 13.0 - 17.0 g/dL   HCT 31.0 (L) 39.0 - 52.0 %   MCV 95.4 78.0 - 100.0 fL   MCH 29.8 26.0 - 34.0 pg   MCHC 31.3 30.0 - 36.0 g/dL   RDW 13.2 11.5 - 15.5 %   Platelets 235 150 - 400 K/uL  I-stat troponin, ED  Result Value Ref Range   Troponin i, poc 0.00 0.00 - 0.08 ng/mL   Comment 3           Dg Chest 2 View  12/16/2015  CLINICAL DATA:  Toe amputation recently.  Possible sepsis. EXAM: CHEST  2 VIEW COMPARISON:  12/11/2015 FINDINGS: Lungs are adequately inflated without focal consolidation, effusion or pneumothorax. Cardiomediastinal silhouette and remainder of the exam is unchanged. IMPRESSION: No acute disease. Electronically Signed   By: Marin Olp M.D.   On: 12/16/2015 18:15   Dg Chest 2 View  12/11/2015  CLINICAL DATA:  TIA EXAM: CHEST  2 VIEW COMPARISON:  12/01/2015 FINDINGS: Heart and mediastinal contours are within normal limits. No focal opacities or effusions. No acute bony abnormality. IMPRESSION: No active cardiopulmonary disease. Electronically Signed   By: Rolm Baptise M.D.   On: 12/11/2015 07:40   Dg Chest 2 View  12/01/2015  CLINICAL DATA:  Chest pain for couple of hours. EXAM: CHEST  2 VIEW COMPARISON:  08/26/2015 FINDINGS: The heart size and mediastinal contours are within normal limits. Both lungs are clear. The visualized skeletal structures are unremarkable. IMPRESSION: No active cardiopulmonary disease. Electronically Signed   By: Lucienne Capers M.D.   On:  12/01/2015 01:37   Mr Brain Wo Contrast  12/23/2015  CLINICAL DATA:  45 year old male with progressive foot numbness following surgery for abscess. Abnormal sensation tracking now all up to the hip and left lower extremity weakness. Initial encounter. EXAM: MRI HEAD WITHOUT CONTRAST TECHNIQUE: Multiplanar, multiecho pulse sequences of the brain and surrounding structures were obtained without intravenous contrast. COMPARISON:  Brain MRI 12/11/2015, and earlier. FINDINGS: Major intracranial vascular flow voids are stable. No restricted diffusion to suggest acute infarction. No midline shift, mass effect, evidence of mass lesion, ventriculomegaly, extra-axial collection or acute intracranial hemorrhage. Cervicomedullary junction and pituitary are within normal limits. Cerebral volume is normal. Pearline Cables and white matter signal remains normal. No chronic cerebral blood products. Visible internal auditory structures appear normal. Visualized paranasal sinuses and mastoids are stable and well pneumatized. Negative orbit and scalp soft tissues. Negative visualized cervical spine. Normal visible bone marrow signal. IMPRESSION: Stable and normal noncontrast MRI appearance of the brain. Electronically Signed   By: Genevie Ann M.D.   On: 12/23/2015 11:17   Mr Brain Wo Contrast  12/11/2015  CLINICAL DATA:  Recurrent acute LEFT-sided numbness and weakness. Headache. EXAM: MRI HEAD WITHOUT CONTRAST TECHNIQUE: Multiplanar, multiecho pulse sequences of the brain and surrounding structures were obtained without intravenous contrast. COMPARISON:  None. FINDINGS: No evidence for acute infarction, hemorrhage, mass lesion, hydrocephalus, or extra-axial fluid. Normal cerebral volume. No significant white matter disease. Pituitary, pineal, and cerebellar tonsils unremarkable. No upper cervical lesions. Flow voids are maintained throughout the carotid, basilar, and vertebral arteries. There are no areas of chronic hemorrhage. Visualized  calvarium, skull base, and upper cervical osseous structures unremarkable. Scalp and extracranial soft tissues, orbits, sinuses, and mastoids show no acute process. IMPRESSION: Negative exam.  No acute or focal intracranial findings. Electronically Signed   By: Staci Righter M.D.   On: 12/11/2015 13:34   Mr Lumbar Spine W Wo Contrast  12/20/2015  CLINICAL DATA:  Left foot wound with recent toe amputation. Diabetes, hypertension, hyperlipidemia. Left leg weakness EXAM: MRI LUMBAR SPINE WITHOUT AND WITH CONTRAST TECHNIQUE: Multiplanar and multiecho pulse sequences of the lumbar spine were obtained without and with intravenous contrast. CONTRAST:  77m MULTIHANCE GADOBENATE DIMEGLUMINE 529 MG/ML IV SOLN COMPARISON:  None. FINDINGS: Segmentation:  Normal segmentation.  Lowest disc space L5-S1. Alignment:  Normal Vertebrae: Negative for fracture or mass. No evidence of spinal infection. Normal enhancement of the lumbar spine Conus medullaris: Extends to the L1 level and appears normal. Paraspinal and other soft tissues: Paraspinous muscles normal. No retroperitoneal mass or fluid collection. Diffuse bladder wall thickening, question cystitis. Disc levels: L1-2:  Negative L2-3:  Negative L3-4:  Negative L4-5: Mild disc and mild facet degeneration. Mild foraminal narrowing bilaterally. No significant spinal stenosis L5-S1:  Negative IMPRESSION: Mild disc degeneration at L4-5 with mild foraminal narrowing bilaterally. No disc protrusion or spinal stenosis Bladder wall thickening, question cystitis Electronically Signed   By: CFranchot GalloM.D.   On: 12/20/2015 19:13   Mr Foot Left Wo Contrast  12/17/2015  CLINICAL DATA:  Cellulitis of the left foot. Drainage of an abscess on December 02, 2015. EXAM: MRI OF THE LEFT FOOT WITHOUT CONTRAST TECHNIQUE: Multiplanar,  multisequence MR imaging was performed. No intravenous contrast was administered. COMPARISON:  Radiographs dated 12/16/2015 and MRI dated 12/01/2015 FINDINGS: There  has been marked reduction in the size of the subcutaneous abscess on the dorsum of the left foot. The abscess now measures approximately 4.3 cm in width and 3.9 cm in length and approximately 6 mm in depth. This communicates with the soft tissue wound on the dorsum of the foot which is centered over the abscess. Marked reduction in the subcutaneous edema on the dorsum of the foot. There is no osteomyelitis or joint effusions.  No myositis. IMPRESSION: Marked improvement in the subcutaneous abscess on the dorsum of the foot. Residual abscess deep to the incision as described. Electronically Signed   By: Lorriane Shire M.D.   On: 12/17/2015 12:16   Mr Foot Left Wo Contrast  12/02/2015  CLINICAL DATA:  Left foot pain and left second toe swelling for 2 weeks. Fever and chills. Question abscess or osteomyelitis. EXAM: MRI OF THE LEFT FOREFOOT WITHOUT CONTRAST TECHNIQUE: Multiplanar, multisequence MR imaging was performed. No intravenous contrast was administered. COMPARISON:  Plain films left foot 11/30/2015. MRI left foot 08/02/2015. FINDINGS: Amputation of the great toe is identified as seen on comparison plain films. Marked soft tissue edema is seen over the dorsum of the foot. A large area of heterogeneous signal abnormality is seen in the subcutaneous soft tissues over the dorsum of the foot. This area measures up to 4.4 cm transverse at the level of the distal metatarsals by 1.8 cm craniocaudal by 5.6 cm long. Smaller areas of similar signal abnormality are seen extending to the level of the mid foot. The coronal STIR sequence demonstrates mild appearing edema distal phalanx of the second toe but this cannot be confirmed on other sequences. Bone marrow signal is otherwise unremarkable. IMPRESSION: Intense edema over the dorsum of the foot is compatible with cellulitis. Heterogeneous areas of signal abnormality in the dorsum of the foot is worrisome for abscess. Mildly increased T2 signal in the coronal STIR  sequence in the distal phalanx of the second toe cannot be confirmed on other sequences. No convincing evidence of osteomyelitis is identified. Electronically Signed   By: Inge Rise M.D.   On: 12/02/2015 08:43   Dg Foot Complete Left  12/30/2015  CLINICAL DATA:  45 year old male with amputation of the left foot 3 weeks ago presenting with swelling. EXAM: LEFT FOOT - COMPLETE 3+ VIEW COMPARISON:  Left foot MRI dated 12/17/2015 FINDINGS: There is amputation of the great toe at the metatarsophalangeal joint. There is also amputation of the distal aspect of the proximal phalanx of the second toe. There is no acute fracture or dislocation. There is no bony erosions or periosteal reaction. No significant arthropathy. There is diffuse soft tissue swelling of the forefoot and over the first metatarsal head. On the lateral projection there is an area of lucency in the soft tissues of the dorsum of the foot which may represent a skin ulcer or air within the soft tissue concerning for an abscess. Clinical correlation is recommended. No radiopaque foreign object identified. IMPRESSION: Amputation of the great toe and distal aspect of the second toe. Diffuse soft tissue swelling of the forefoot with an area of skin ulceration and soft tissue gas in the dorsum of the foot which may represent an abscess. Clinical correlation is recommended. Electronically Signed   By: Anner Crete M.D.   On: 12/30/2015 18:43   Dg Foot Complete Left  12/16/2015  CLINICAL DATA:  Left foot numbness.  Rule out osteomyelitis EXAM: LEFT FOOT - COMPLETE 3+ VIEW COMPARISON:  12/14/2015 FINDINGS: Amputation of the great toe at the level of the MTP joint. Amputation of the second toe at the level of the distal aspect of the proximal phalanx. Negative for osteomyelitis. No fracture or arthropathy. No change from the recent study. Arterial calcification. Wound on the dorsum of the foot. IMPRESSION: No interval change.  Negative for  osteomyelitis. Electronically Signed   By: Franchot Gallo M.D.   On: 12/16/2015 19:11   Dg Foot Complete Left  12/14/2015  CLINICAL DATA:  Awoke with complete numbness LEFT foot, no feeling, no pain, surgery 1 week ago with partial amputation of second toe and I & D of infected area, swelling, history type II diabetes mellitus, CHF, hyperlipidemia, hypertension EXAM: LEFT FOOT - COMPLETE 3+ VIEW COMPARISON:  11/30/2015 FINDINGS: Osseous mineralization normal. Prior amputation great toe. Interval amputation second toe through head of proximal phalanx. Soft tissue swelling of forefoot and second toe stump. No acute fracture, dislocation, or bone destruction. Scattered small vessel vascular calcification. IMPRESSION: Interval amputation of second toe through head of proximal phalanx. Prior amputation great toe. No acute osseous abnormalities. Electronically Signed   By: Lavonia Dana M.D.   On: 12/14/2015 15:15   Dg Foot Complete Left  12/01/2015  CLINICAL DATA:  Left foot wound. Diabetes. Second toe nail fell off 5 days ago. EXAM: LEFT FOOT - COMPLETE 3+ VIEW COMPARISON:  11/21/2015 FINDINGS: Previous amputation of the left first toe at the metatarsal-phalangeal joint level. Soft tissues at the site of resection or prominent but unchanged since previous study. No radiopaque soft tissue foreign bodies or gas collections. Vascular calcifications. No bone erosion or sclerosis to suggest osteomyelitis. No acute fracture or dislocation. IMPRESSION: Previous amputation of the left first toe. No evidence of osteomyelitis. Electronically Signed   By: Lucienne Capers M.D.   On: 12/01/2015 00:27   Ct Head Code Stroke W/o Cm  12/10/2015  CLINICAL DATA:  45 year old male with code stroke. EXAM: CT HEAD WITHOUT CONTRAST TECHNIQUE: Contiguous axial images were obtained from the base of the skull through the vertex without intravenous contrast. COMPARISON:  Head CT dated 08/26/2015 and MRI dated 08/26/2015 FINDINGS: The  ventricles and the sulci are appropriate in size for the patient's age. There is no intracranial hemorrhage. No midline shift or mass effect identified. The gray-white matter differentiation is preserved. The visualized paranasal sinuses and mastoid air cells are well aerated. The calvarium is intact. IMPRESSION: No acute intracranial pathology. These results were called by telephone at the time of interpretation on 12/10/2015 at 9:08 pm to Dr. Nicole Kindred, who verbally acknowledged these results. Electronically Signed   By: Anner Crete M.D.   On: 12/10/2015 21:16    I have personally reviewed and evaluated these images and lab results as part of my medical decision-making.       MDM   Final diagnoses:  Cellulitis of left foot    Patient with large swollen left foot.  Multiple recent surgeries for infection and amputation.  Followed by Dr. Marlou Sa of orthopedic surgery.  Had wound vac removed earlier this week and was told to carefully watch swelling and fever.    Pt. Reports fever to 102 with significant new swelling.  Patient discussed with Dr. Roderic Palau, who agrees with plan to consult orthopedics.  Discussed with Dr. [name] from ortho, who recommends admission and will consult. Start abx and check x-ray and maybe MRI.  6:19 PM Pharmacy recommends Vanc, cefepime, and Flagyl.  7:03 PM Appreciate Dr. Roel Cluck for admitting the patient.  Dr. [name] to consult in the morning.  Will hold off on MRI until ortho evaluation.   Montine Circle, PA-C 12/30/15 1903  Milton Ferguson, MD 12/30/15 2249

## 2015-12-30 NOTE — ED Notes (Signed)
Pt had surg on left foot and had wound vac removed on Wed.  Has had bleeding off and on since then.  Pt was told to come to ED if any problems   Pt here for admission

## 2015-12-31 ENCOUNTER — Inpatient Hospital Stay (HOSPITAL_COMMUNITY): Payer: Medicaid Other

## 2015-12-31 DIAGNOSIS — I10 Essential (primary) hypertension: Secondary | ICD-10-CM

## 2015-12-31 DIAGNOSIS — L0291 Cutaneous abscess, unspecified: Secondary | ICD-10-CM

## 2015-12-31 DIAGNOSIS — L0390 Cellulitis, unspecified: Secondary | ICD-10-CM

## 2015-12-31 DIAGNOSIS — D508 Other iron deficiency anemias: Secondary | ICD-10-CM

## 2015-12-31 DIAGNOSIS — E1169 Type 2 diabetes mellitus with other specified complication: Secondary | ICD-10-CM

## 2015-12-31 DIAGNOSIS — L089 Local infection of the skin and subcutaneous tissue, unspecified: Secondary | ICD-10-CM

## 2015-12-31 DIAGNOSIS — I5032 Chronic diastolic (congestive) heart failure: Secondary | ICD-10-CM

## 2015-12-31 LAB — GLUCOSE, CAPILLARY
GLUCOSE-CAPILLARY: 266 mg/dL — AB (ref 65–99)
Glucose-Capillary: 258 mg/dL — ABNORMAL HIGH (ref 65–99)
Glucose-Capillary: 165 mg/dL — ABNORMAL HIGH (ref 65–99)
Glucose-Capillary: 238 mg/dL — ABNORMAL HIGH (ref 65–99)

## 2015-12-31 LAB — COMPREHENSIVE METABOLIC PANEL
ALK PHOS: 52 U/L (ref 38–126)
ALT: 14 U/L — AB (ref 17–63)
AST: 13 U/L — AB (ref 15–41)
Albumin: 2.8 g/dL — ABNORMAL LOW (ref 3.5–5.0)
Anion gap: 4 — ABNORMAL LOW (ref 5–15)
BUN: 16 mg/dL (ref 6–20)
CALCIUM: 8.7 mg/dL — AB (ref 8.9–10.3)
CHLORIDE: 107 mmol/L (ref 101–111)
CO2: 28 mmol/L (ref 22–32)
CREATININE: 1.07 mg/dL (ref 0.61–1.24)
GFR calc Af Amer: >60 mL/min (ref >60)
Glucose, Bld: 277 mg/dL — ABNORMAL HIGH (ref 65–99)
Potassium: 4.8 mmol/L (ref 3.5–5.1)
Sodium: 139 mmol/L (ref 135–145)
Total Bilirubin: 0.3 mg/dL (ref 0.3–1.2)
Total Protein: 5.4 g/dL — ABNORMAL LOW (ref 6.5–8.1)

## 2015-12-31 LAB — RETICULOCYTES
RBC.: 3.01 MIL/uL — AB (ref 4.22–5.81)
RETIC COUNT ABSOLUTE: 30.1 10*3/uL (ref 19.0–186.0)
RETIC CT PCT: 1 % (ref 0.4–3.1)

## 2015-12-31 LAB — CBC
HCT: 28.4 % — ABNORMAL LOW (ref 39.0–52.0)
Hemoglobin: 9.1 g/dL — ABNORMAL LOW (ref 13.0–17.0)
MCH: 30.2 pg (ref 26.0–34.0)
MCHC: 32 g/dL (ref 30.0–36.0)
MCV: 94.4 fL (ref 78.0–100.0)
PLATELETS: 206 10*3/uL (ref 150–400)
RBC: 3.01 MIL/uL — ABNORMAL LOW (ref 4.22–5.81)
RDW: 13.2 % (ref 11.5–15.5)
WBC: 7.7 10*3/uL (ref 4.0–10.5)

## 2015-12-31 LAB — FOLATE: FOLATE: 12.3 ng/mL (ref >5.9)

## 2015-12-31 LAB — FERRITIN: FERRITIN: 284 ng/mL (ref 24–336)

## 2015-12-31 LAB — VITAMIN B12: Vitamin B-12: 329 pg/mL (ref 180–914)

## 2015-12-31 LAB — IRON AND TIBC
Iron: 30 ug/dL — ABNORMAL LOW (ref 45–182)
Saturation Ratios: 13 % — ABNORMAL LOW (ref 17.9–39.5)
TIBC: 224 ug/dL — ABNORMAL LOW (ref 250–450)
UIBC: 194 ug/dL

## 2015-12-31 LAB — TSH: TSH: 0.408 u[IU]/mL (ref 0.350–4.500)

## 2015-12-31 LAB — MAGNESIUM: Magnesium: 1.6 mg/dL — ABNORMAL LOW (ref 1.7–2.4)

## 2015-12-31 LAB — C-REACTIVE PROTEIN: CRP: 0.8 mg/dL (ref <1.0)

## 2015-12-31 LAB — SEDIMENTATION RATE: SED RATE: 20 mm/h — AB (ref 0–16)

## 2015-12-31 LAB — PREALBUMIN: PREALBUMIN: 30.7 mg/dL (ref 18–38)

## 2015-12-31 LAB — PHOSPHORUS: Phosphorus: 3 mg/dL (ref 2.5–4.6)

## 2015-12-31 MED ORDER — ENOXAPARIN SODIUM 40 MG/0.4ML ~~LOC~~ SOLN
40.0000 mg | SUBCUTANEOUS | Status: DC
  Administered 2015-12-31 – 2016-01-02 (×3): 40 mg via SUBCUTANEOUS
  Filled 2015-12-31 (×3): qty 0.4

## 2015-12-31 MED ORDER — FERROUS GLUCONATE 324 (38 FE) MG PO TABS
324.0000 mg | ORAL_TABLET | Freq: Three times a day (TID) | ORAL | Status: DC
  Administered 2015-12-31 – 2016-01-02 (×6): 324 mg via ORAL
  Filled 2015-12-31 (×9): qty 1

## 2015-12-31 NOTE — Progress Notes (Signed)
Patient ID: Shane [name], male   DOB: 24-Nov-1970, 45 y.o.   MRN: [id number] No definite osteo on MRI. Dr. Alphonzo Severance to see in AM. Continue IV ABX and elevation of foot.

## 2015-12-31 NOTE — Progress Notes (Signed)
[facility]  PROGRESS NOTE  Shane [name] [id number] DOB: [birth year] DOA: 12/30/2015 PCP: Minerva Ends, MD    Brief HPI:  45 y.o. male with medical history significant of DM 2, chronic left foot wound, hypertension, anemia, left side left leg numbness and weakness thought to be secondary to conversion disorder, OSA not on C Pap, diastolic dysfunction, TIA, hyperlipidemia and anxiety Presented with bleeding from surgical site his wound VAC has been removed on 12 July Complaining of some nausea vomiting chills and fever up to 102 at home. He endorses dyspnea on exertion ever since his discharge but no chest pain.  Patient with With known left foot diabetic ulcer status post amputation of second 2 and I&D of left dorsal foot abscess followed closely by orthopedics Dr. Marlou Sa. Has been recently admitted currently on doxycycline he had had workup in the past including penis Dopplers which were negative for DVT and bilateral ABI which were normal.> 1 blood cultures have been negative patient had 20 VAC placed on July 4 in sinus been discontinued. Patient had developed progressive numbness of his foot which extended all over up to his hip. He has undergone MRI which showed no evidence of herniated disc or nerve root compression. MRI of the brain was unremarkable he was seen in consult by neurology felt that was secondary to conversion disorder with inconsistent physical exam  Active Problems:   DM2 (diabetes mellitus, type 2) (HCC)   Sensory disturbance   Essential hypertension   Anemia   Diabetic foot infection ([place])   Cellulitis and abscess, left foot   Wound infection ([place])   Type 2 diabetes mellitus with vascular disease ([place])   Left foot infection   Diabetic foot ulcer (HCC)   Chronic diastolic CHF (congestive heart failure), NYHA class 1 (HCC)   Assessment/Plan: 1. Diabetic foot infection- orthopedics consulted, continue IV antibiotics vancomycin and Flagyl. 2. Chronic  anemia- hemoglobin 9.1 today, patient is Jehovah's Witness and does not want blood transfusion. Anemia panel obtained showed iron 30, saturation 13%, TIBC 224. We'll start  on ferrous gluconate 324 milligrams 3 times a day. 3. Hypertension- blood pressure is stable, continue lisinopril, Coreg 4. Diabetes mellitus- hold metformin, continue Lantus and sliding scale insulin with NovoLog 5. Bipolar disorder- continue Prozac, trazodone, Depakote. 6. History of diastolic dysfunction- currently euvolemic.    DVT prophylaxis: Lovenox Code Status: Full code Family Communication: Discussed with wife at bedside Disposition Plan: pending improvement in infection   Consultants:  Orthopedics  Procedures:  None   Antibiotics:  Vancomycin  Flagyl  Subjective: Patient seen and examined, denies pain this morning.  Objective: Filed Vitals:   12/30/15 1930 12/30/15 1959 12/31/15 0357 12/31/15 1026  BP: 140/91 154/87 133/80 159/89  Pulse: 85 91 87 85  Temp:  97.9 F (36.6 C) 97.3 F (36.3 C)   TempSrc:  Oral Oral   Resp:  18 18   Height:  5\' 8"  (1.727 m)    Weight:  72.12 kg (158 lb 15.9 oz)    SpO2: 100% 100% 99%     Intake/Output Summary (Last 24 hours) at 12/31/15 1116 Last data filed at 12/31/15 1100  Gross per 24 hour  Intake      0 ml  Output   1250 ml  Net  -1250 ml   Filed Weights   12/30/15 1513 12/30/15 1959  Weight: 78.926 kg (174 lb) 72.12 kg (158 lb 15.9 oz)    Examination:  General exam: Appears calm and comfortable  Respiratory  system: Clear to auscultation. Respiratory effort normal. Cardiovascular system: S1 & S2 heard, RRR. No JVD, murmurs, rubs, gallops or clicks. No pedal edema. Gastrointestinal system: Abdomen is nondistended, soft and nontender. No organomegaly or masses felt. Normal bowel sounds heard. Central nervous system: Alert and oriented. No focal neurological deficits. Extremities: Large ulcer noted on the dorsum of the left foot Skin: No  rashes, lesions or ulcers Psychiatry: Judgement and insight appear normal. Mood & affect appropriate.    Data Reviewed: I have personally reviewed following labs and imaging studies Basic Metabolic Panel:  Recent Labs Lab 12/30/15 1521 12/31/15 0233  NA 137 139  K 4.4 4.8  CL 106 107  CO2 24 28  GLUCOSE 194* 277*  BUN 18 16  CREATININE 1.10 1.07  CALCIUM 9.0 8.7*  MG  --  1.6*  PHOS  --  3.0   Liver Function Tests:  Recent Labs Lab 12/30/15 1521 12/31/15 0233  AST 20 13*  ALT 17 14*  ALKPHOS 59 52  BILITOT 0.5 0.3  PROT 6.7 5.4*  ALBUMIN 3.6 2.8*   CBC:  Recent Labs Lab 12/30/15 1521 12/31/15 0233  WBC 8.1 7.7  HGB 9.7* 9.1*  HCT 31.0* 28.4*  MCV 95.4 94.4  PLT 235 206   BNP (last 3 results)  Recent Labs  08/01/15 2106 12/01/15 0801  BNP 34.7 18.2    ProBNP (last 3 results) No results for input(s): PROBNP in the last 8760 hours.  CBG:  Recent Labs Lab 12/30/15 2046 12/31/15 0357  GLUCAP 126* 258*    Studies: Dg Chest 2 View  12/30/2015  CLINICAL DATA:  45 year old male with dyspnea EXAM: CHEST  2 VIEW COMPARISON:  Chest radiograph dated 12/16/2015 FINDINGS: The heart size and mediastinal contours are within normal limits. Both lungs are clear. The visualized skeletal structures are unremarkable. IMPRESSION: No active cardiopulmonary disease. Electronically Signed   By: Anner Crete M.D.   On: 12/30/2015 21:56   Dg Foot Complete Left  12/30/2015  CLINICAL DATA:  45 year old male with amputation of the left foot 3 weeks ago presenting with swelling. EXAM: LEFT FOOT - COMPLETE 3+ VIEW COMPARISON:  Left foot MRI dated 12/17/2015 FINDINGS: There is amputation of the great toe at the metatarsophalangeal joint. There is also amputation of the distal aspect of the proximal phalanx of the second toe. There is no acute fracture or dislocation. There is no bony erosions or periosteal reaction. No significant arthropathy. There is diffuse soft tissue  swelling of the forefoot and over the first metatarsal head. On the lateral projection there is an area of lucency in the soft tissues of the dorsum of the foot which may represent a skin ulcer or air within the soft tissue concerning for an abscess. Clinical correlation is recommended. No radiopaque foreign object identified. IMPRESSION: Amputation of the great toe and distal aspect of the second toe. Diffuse soft tissue swelling of the forefoot with an area of skin ulceration and soft tissue gas in the dorsum of the foot which may represent an abscess. Clinical correlation is recommended. Electronically Signed   By: Anner Crete M.D.   On: 12/30/2015 18:43    Scheduled Meds: . atorvastatin  80 mg Oral q morning - 10a  . carvedilol  12.5 mg Oral BID WC  . ceFEPime (MAXIPIME) IV  2 g Intravenous Q12H  . divalproex  1,000 mg Oral QHS  . ferrous sulfate  325 mg Oral Q breakfast  . FLUoxetine  10 mg Oral Daily  .  gabapentin  100 mg Oral TID  . insulin aspart  0-9 Units Subcutaneous Q4H  . insulin glargine  5 Units Subcutaneous Q2200  . lisinopril  40 mg Oral BH-q7a  . metronidazole  500 mg Intravenous Q8H  . pantoprazole  40 mg Oral Daily  . vancomycin  1,000 mg Intravenous Q12H   Continuous Infusions:      Time spent: 25 min    [facility] Pager [phone]. If 7PM-7AM, please contact night-coverage at [email or web address], Office  [phone]  password [email or web address] 12/31/2015, 11:16 AM  LOS: 1 day

## 2016-01-01 DIAGNOSIS — E1140 Type 2 diabetes mellitus with diabetic neuropathy, unspecified: Secondary | ICD-10-CM

## 2016-01-01 LAB — BASIC METABOLIC PANEL
ANION GAP: 5 (ref 5–15)
BUN: 13 mg/dL (ref 6–20)
CALCIUM: 8.8 mg/dL — AB (ref 8.9–10.3)
CO2: 28 mmol/L (ref 22–32)
CREATININE: 0.98 mg/dL (ref 0.61–1.24)
Chloride: 105 mmol/L (ref 101–111)
GLUCOSE: 173 mg/dL — AB (ref 65–99)
Potassium: 4.5 mmol/L (ref 3.5–5.1)
Sodium: 138 mmol/L (ref 135–145)

## 2016-01-01 LAB — CBC
HCT: 30.1 % — ABNORMAL LOW (ref 39.0–52.0)
Hemoglobin: 9.5 g/dL — ABNORMAL LOW (ref 13.0–17.0)
MCH: 29.8 pg (ref 26.0–34.0)
MCHC: 31.6 g/dL (ref 30.0–36.0)
MCV: 94.4 fL (ref 78.0–100.0)
PLATELETS: 217 10*3/uL (ref 150–400)
RBC: 3.19 MIL/uL — ABNORMAL LOW (ref 4.22–5.81)
RDW: 13.1 % (ref 11.5–15.5)
WBC: 6.8 10*3/uL (ref 4.0–10.5)

## 2016-01-01 LAB — GLUCOSE, CAPILLARY
GLUCOSE-CAPILLARY: 133 mg/dL — AB (ref 65–99)
Glucose-Capillary: 145 mg/dL — ABNORMAL HIGH (ref 65–99)
Glucose-Capillary: 250 mg/dL — ABNORMAL HIGH (ref 65–99)
Glucose-Capillary: 278 mg/dL — ABNORMAL HIGH (ref 65–99)

## 2016-01-01 LAB — HEMOGLOBIN A1C
Hgb A1c MFr Bld: 8 % — ABNORMAL HIGH (ref 4.8–5.6)
MEAN PLASMA GLUCOSE: 183 mg/dL

## 2016-01-01 MED ORDER — GLUCERNA SHAKE PO LIQD
237.0000 mL | Freq: Two times a day (BID) | ORAL | Status: DC
  Administered 2016-01-01 – 2016-01-02 (×3): 237 mL via ORAL

## 2016-01-01 MED ORDER — INSULIN ASPART 100 UNIT/ML ~~LOC~~ SOLN
0.0000 [IU] | Freq: Three times a day (TID) | SUBCUTANEOUS | Status: DC
  Administered 2016-01-02: 5 [IU] via SUBCUTANEOUS
  Administered 2016-01-02: 2 [IU] via SUBCUTANEOUS
  Administered 2016-01-02: 3 [IU] via SUBCUTANEOUS

## 2016-01-01 NOTE — Progress Notes (Signed)
Initial Nutrition Assessment  DOCUMENTATION CODES:   Not applicable  INTERVENTION:  Provide Glucerna Shake po BID, each supplement provides 220 kcal and 10 grams of protein.  Encourage adequate PO intake.   NUTRITION DIAGNOSIS:   Increased nutrient needs related to wound healing as evidenced by estimated needs.  GOAL:   Patient will meet greater than or equal to 90% of their needs  MONITOR:   PO intake, Supplement acceptance, Weight trends, Labs, I & O's, Skin  REASON FOR ASSESSMENT:   Consult Wound healing  ASSESSMENT:   45 y.o. male with medical history significant of DM 2, chronic left foot wound, hypertension, anemia, left side left leg numbness and weakness thought to be secondary to conversion disorder, OSA not on C Pap, diastolic dysfunction, TIA, hyperlipidemia and anxiety. Presents with left foot wound infection  Pt reports having a good appetite currently and PTA with usual intake of 3 meals a day with no other difficulties. During time of visit, pt has not eaten yet however reports hunger. Usual body weight reported to be ~179 lbs and reports weight has been stable. Pt was weighed on bed scale during time of visit which revealed 180 lbs. Question accuracy of most recently recorded weight. Pt with left foot wound. Pt is agreeable to nutritional supplements to aid in wound healing. RD to order.   Pt with no observed significant fat or muscle mass loss.   Labs and medications reviewed.   Diet Order:  Diet Carb Modified Fluid consistency:: Thin; Room service appropriate?: Yes  Skin:  Wound (see comment) (L foot wound infection with wound VAC)  Last BM:  7/14  Height:   Ht Readings from Last 1 Encounters:  12/30/15 5\' 8"  (1.727 m)    Weight:   Wt Readings from Last 1 Encounters:  12/30/15 158 lb 15.9 oz (72.12 kg)    Ideal Body Weight:  70 kg  BMI:  Body mass index is 24.18 kg/(m^2).  Estimated Nutritional Needs:   Kcal:  2000-2300  Protein:   100-110 grams  Fluid:  2- 2.3 L/day  EDUCATION NEEDS:   No education needs identified at this time  Corrin Parker, MS, RD, LDN Pager # [phone] After hours/ weekend pager # [phone]

## 2016-01-01 NOTE — Consult Note (Addendum)
WOC consult requested prior to ortho service involvement.  Consult note in EMR indicates [name] will follow for assessment and plan of care for foot wound; please refer to their service for further questions. Please re-consult if further assistance is needed.  Thank-you,  Julien Girt MSN, [place], [place], [place], [place]

## 2016-01-01 NOTE — Progress Notes (Signed)
Left foot looks ok Mri neg for osteo Swelling amount appropriate Gel foam and soft wrap today then 7 day provena

## 2016-01-01 NOTE — Progress Notes (Signed)
[facility]  PROGRESS NOTE  Shane [name] [id number] DOB: 10/27/1970 DOA: 12/30/2015 PCP: Minerva Ends, MD    Brief HPI:  45 y.o. male with medical history significant of DM 2, chronic left foot wound, hypertension, anemia, left side left leg numbness and weakness thought to be secondary to conversion disorder, OSA not on C Pap, diastolic dysfunction, TIA, hyperlipidemia and anxiety Presented with bleeding from surgical site his wound VAC has been removed on 12 July Complaining of some nausea vomiting chills and fever up to 102 at home. He endorses dyspnea on exertion ever since his discharge but no chest pain.  Patient with With known left foot diabetic ulcer status post amputation of second 2 and I&D of left dorsal foot abscess followed closely by orthopedics Dr. Marlou Sa. Has been recently admitted currently on doxycycline he had had workup in the past including penis Dopplers which were negative for DVT and bilateral ABI which were normal.> 1 blood cultures have been negative patient had 20 VAC placed on July 4 in sinus been discontinued. Patient had developed progressive numbness of his foot which extended all over up to his hip. He has undergone MRI which showed no evidence of herniated disc or nerve root compression. MRI of the brain was unremarkable he was seen in consult by neurology felt that was secondary to conversion disorder with inconsistent physical exam  Active Problems:   DM2 (diabetes mellitus, type 2) (HCC)   Sensory disturbance   Essential hypertension   Anemia   Diabetic foot infection ([place])   Cellulitis and abscess, left foot   Wound infection ([place])   Type 2 diabetes mellitus with vascular disease ([place])   Left foot infection   Diabetic foot ulcer (HCC)   Chronic diastolic CHF (congestive heart failure), NYHA class 1 (HCC)   Assessment/Plan: 1. Diabetic foot infection- orthopedics consulted, continue IV antibiotics vancomycin and Flagyl. [name] to see  the patient today. 2. Chronic anemia- hemoglobin 9.1 today, patient is Jehovah's Witness and does not want blood transfusion. Anemia panel obtained showed iron 30, saturation 13%, TIBC 224. We'll start  on ferrous gluconate 324 milligrams 3 times a day. 3. Hypertension- blood pressure is stable, continue lisinopril, Coreg 4. Diabetes mellitus- hold metformin, continue Lantus and sliding scale insulin with NovoLog 5. Bipolar disorder- continue Prozac, trazodone, Depakote. 6. History of diastolic dysfunction- currently euvolemic.    DVT prophylaxis: Lovenox Code Status: Full code Family Communication: Discussed with wife at bedside Disposition Plan: pending improvement in infection   Consultants:  Orthopedics  Procedures:  None   Antibiotics:  Vancomycin  Flagyl  Subjective: Patient seen and examined, denies pain this morning. Was sen by Dr [name] last night.  Objective: Filed Vitals:   12/31/15 1745 12/31/15 1749 12/31/15 2002 01/01/16 0525  BP: 174/99  157/97 151/87  Pulse:  85 82 81  Temp:   97.7 F (36.5 C) 97.9 F (36.6 C)  TempSrc:   Oral Oral  Resp:   18 18  Height:      Weight:      SpO2:   100% 98%    Intake/Output Summary (Last 24 hours) at 01/01/16 1443 Last data filed at 01/01/16 1100  Gross per 24 hour  Intake    480 ml  Output   2100 ml  Net  -1620 ml   Filed Weights   12/30/15 1513 12/30/15 1959  Weight: 78.926 kg (174 lb) 72.12 kg (158 lb 15.9 oz)    Examination:  General exam: Appears calm  and comfortable  Respiratory system: Clear to auscultation. Respiratory effort normal. Cardiovascular system: S1 & S2 heard, RRR. No JVD, murmurs, rubs, gallops or clicks. No pedal edema. Gastrointestinal system: Abdomen is nondistended, soft and nontender. No organomegaly or masses felt. Normal bowel sounds heard. Central nervous system: Alert and oriented. No focal neurological deficits. Extremities: Large ulcer noted on the dorsum of the left  foot Skin: No rashes, lesions or ulcers Psychiatry: Judgement and insight appear normal. Mood & affect appropriate.    Data Reviewed: I have personally reviewed following labs and imaging studies Basic Metabolic Panel:  Recent Labs Lab 12/30/15 1521 12/31/15 0233 01/01/16 0233  NA 137 139 138  K 4.4 4.8 4.5  CL 106 107 105  CO2 24 28 28   GLUCOSE 194* 277* 173*  BUN 18 16 13   CREATININE 1.10 1.07 0.98  CALCIUM 9.0 8.7* 8.8*  MG  --  1.6*  --   PHOS  --  3.0  --    Liver Function Tests:  Recent Labs Lab 12/30/15 1521 12/31/15 0233  AST 20 13*  ALT 17 14*  ALKPHOS 59 52  BILITOT 0.5 0.3  PROT 6.7 5.4*  ALBUMIN 3.6 2.8*   CBC:  Recent Labs Lab 12/30/15 1521 12/31/15 0233 01/01/16 0233  WBC 8.1 7.7 6.8  HGB 9.7* 9.1* 9.5*  HCT 31.0* 28.4* 30.1*  MCV 95.4 94.4 94.4  PLT 235 206 217   BNP (last 3 results)  Recent Labs  08/01/15 2106 12/01/15 0801  BNP 34.7 18.2    ProBNP (last 3 results) No results for input(s): PROBNP in the last 8760 hours.  CBG:  Recent Labs Lab 12/31/15 1010 12/31/15 1616 12/31/15 1958 01/01/16 0607 01/01/16 1218  GLUCAP 165* 266* 238* 145* 133*    Studies: Dg Chest 2 View  12/30/2015  CLINICAL DATA:  45 year old male with dyspnea EXAM: CHEST  2 VIEW COMPARISON:  Chest radiograph dated 12/16/2015 FINDINGS: The heart size and mediastinal contours are within normal limits. Both lungs are clear. The visualized skeletal structures are unremarkable. IMPRESSION: No active cardiopulmonary disease. Electronically Signed   By: Anner Crete M.D.   On: 12/30/2015 21:56   Mr Foot Left Wo Contrast  12/31/2015  CLINICAL DATA:  Bleeding at the surgical site. Fever, chills, diabetic foot ulcer EXAM: MRI OF THE LEFT FOREFOOT WITHOUT CONTRAST; MRI OF THE LEFT ANKLE WITHOUT CONTRAST TECHNIQUE: Multiplanar, multisequence MR imaging was performed. No intravenous contrast was administered. COMPARISON:  12/17/2015, 12/01/2015 FINDINGS: TENDONS  Peroneal: Peroneal longus tendon intact. Peroneal brevis intact. Posteromedial: Posterior tibial tendon intact. Flexor hallucis longus tendon intact. Flexor digitorum longus tendon intact. Anterior: Tibialis anterior tendon intact. Extensor hallucis longus tendon intact Extensor digitorum longus tendon intact. Achilles:  Intact. Plantar Fascia: Intact. LIGAMENTS Lateral: Thickening of the anterior and posterior talofibular ligaments likely secondary to prior injury without disruption. Anterior and posterior tibiofibular ligaments intact. Medial: Deltoid ligament intact. Spring ligament intact. CARTILAGE Ankle Joint: No joint effusion. Normal ankle mortise. No chondral defect. Subtalar Joints/Sinus Tarsi: Normal subtalar joints. No subtalar joint effusion. Normal sinus tarsi. Bones: Amputation of the first phalanx. Mild subcortical reactive marrow edema in the first metatarsal head without significant T1 marrow signal abnormality or cortical destruction suggesting reactive marrow edema. Amputation of the second middle and distal phalanx. Cortical irregularity of the distal aspect of the second proximal phalanx with marrow edema which may reflect osteomyelitis versus postsurgical changes. No other marrow signal abnormality. No acute fracture or dislocation. Soft Tissue: Soft tissue wound along  the dorsal aspect of the foot with underlying mild soft tissue edema involving the dorsal aspect of the foot extending into the ankle. Small amount of air is seen tracking along the dorsal soft tissues. There is severe soft tissue edema between the first and second and second and third metatarsals concerning for infectious myositis. There is diffuse T2 signal throughout the plantar musculature likely neurogenic. IMPRESSION: 1. Soft tissue wound along the dorsal aspect of the foot with underlying mild soft tissue edema involving the dorsal aspect of the foot extending into the ankle concerning for cellulitis. Small amount of air  is seen tracking along the dorsal soft tissues. Severe soft tissue edema between the first and second and second and third metatarsals concerning for infectious myositis. 2. Amputation of the first phalanx. Mild subcortical reactive marrow edema in the first metatarsal head without significant T1 marrow signal abnormality or cortical destruction suggesting reactive marrow edema. 3. Amputation of the second middle and distal phalanx. Cortical irregularity of the distal aspect of the second proximal phalanx with marrow edema which may reflect osteomyelitis versus postsurgical changes. 4. There is diffuse T2 signal throughout the plantar musculature likely neurogenic. Electronically Signed   By: Kathreen Devoid   On: 12/31/2015 15:33   Mr Ankle Left  Wo Contrast  12/31/2015  CLINICAL DATA:  Bleeding at the surgical site. Fever, chills, diabetic foot ulcer EXAM: MRI OF THE LEFT FOREFOOT WITHOUT CONTRAST; MRI OF THE LEFT ANKLE WITHOUT CONTRAST TECHNIQUE: Multiplanar, multisequence MR imaging was performed. No intravenous contrast was administered. COMPARISON:  12/17/2015, 12/01/2015 FINDINGS: TENDONS Peroneal: Peroneal longus tendon intact. Peroneal brevis intact. Posteromedial: Posterior tibial tendon intact. Flexor hallucis longus tendon intact. Flexor digitorum longus tendon intact. Anterior: Tibialis anterior tendon intact. Extensor hallucis longus tendon intact Extensor digitorum longus tendon intact. Achilles:  Intact. Plantar Fascia: Intact. LIGAMENTS Lateral: Thickening of the anterior and posterior talofibular ligaments likely secondary to prior injury without disruption. Anterior and posterior tibiofibular ligaments intact. Medial: Deltoid ligament intact. Spring ligament intact. CARTILAGE Ankle Joint: No joint effusion. Normal ankle mortise. No chondral defect. Subtalar Joints/Sinus Tarsi: Normal subtalar joints. No subtalar joint effusion. Normal sinus tarsi. Bones: Amputation of the first phalanx. Mild  subcortical reactive marrow edema in the first metatarsal head without significant T1 marrow signal abnormality or cortical destruction suggesting reactive marrow edema. Amputation of the second middle and distal phalanx. Cortical irregularity of the distal aspect of the second proximal phalanx with marrow edema which may reflect osteomyelitis versus postsurgical changes. No other marrow signal abnormality. No acute fracture or dislocation. Soft Tissue: Soft tissue wound along the dorsal aspect of the foot with underlying mild soft tissue edema involving the dorsal aspect of the foot extending into the ankle. Small amount of air is seen tracking along the dorsal soft tissues. There is severe soft tissue edema between the first and second and second and third metatarsals concerning for infectious myositis. There is diffuse T2 signal throughout the plantar musculature likely neurogenic. IMPRESSION: 1. Soft tissue wound along the dorsal aspect of the foot with underlying mild soft tissue edema involving the dorsal aspect of the foot extending into the ankle concerning for cellulitis. Small amount of air is seen tracking along the dorsal soft tissues. Severe soft tissue edema between the first and second and second and third metatarsals concerning for infectious myositis. 2. Amputation of the first phalanx. Mild subcortical reactive marrow edema in the first metatarsal head without significant T1 marrow signal abnormality or cortical destruction suggesting reactive marrow  edema. 3. Amputation of the second middle and distal phalanx. Cortical irregularity of the distal aspect of the second proximal phalanx with marrow edema which may reflect osteomyelitis versus postsurgical changes. 4. There is diffuse T2 signal throughout the plantar musculature likely neurogenic. Electronically Signed   By: Kathreen Devoid   On: 12/31/2015 15:33   Dg Foot Complete Left  12/30/2015  CLINICAL DATA:  45 year old male with amputation of  the left foot 3 weeks ago presenting with swelling. EXAM: LEFT FOOT - COMPLETE 3+ VIEW COMPARISON:  Left foot MRI dated 12/17/2015 FINDINGS: There is amputation of the great toe at the metatarsophalangeal joint. There is also amputation of the distal aspect of the proximal phalanx of the second toe. There is no acute fracture or dislocation. There is no bony erosions or periosteal reaction. No significant arthropathy. There is diffuse soft tissue swelling of the forefoot and over the first metatarsal head. On the lateral projection there is an area of lucency in the soft tissues of the dorsum of the foot which may represent a skin ulcer or air within the soft tissue concerning for an abscess. Clinical correlation is recommended. No radiopaque foreign object identified. IMPRESSION: Amputation of the great toe and distal aspect of the second toe. Diffuse soft tissue swelling of the forefoot with an area of skin ulceration and soft tissue gas in the dorsum of the foot which may represent an abscess. Clinical correlation is recommended. Electronically Signed   By: Anner Crete M.D.   On: 12/30/2015 18:43    Scheduled Meds: . atorvastatin  80 mg Oral q morning - 10a  . carvedilol  12.5 mg Oral BID WC  . ceFEPime (MAXIPIME) IV  2 g Intravenous Q12H  . divalproex  1,000 mg Oral QHS  . enoxaparin (LOVENOX) injection  40 mg Subcutaneous Q24H  . feeding supplement (GLUCERNA SHAKE)  237 mL Oral BID BM  . ferrous gluconate  324 mg Oral TID WC  . FLUoxetine  10 mg Oral Daily  . gabapentin  100 mg Oral TID  . insulin glargine  5 Units Subcutaneous Q2200  . lisinopril  40 mg Oral BH-q7a  . metronidazole  500 mg Intravenous Q8H  . pantoprazole  40 mg Oral Daily  . vancomycin  1,000 mg Intravenous Q12H   Continuous Infusions:    Time spent: 25 min    [facility] Pager [phone]. If 7PM-7AM, please contact night-coverage at [email or web address], Office  [phone]  password  TRH1 01/01/2016, 2:43 PM  LOS: 2 days

## 2016-01-01 NOTE — Progress Notes (Signed)
Inpatient Diabetes Program Recommendations  AACE/ADA: New Consensus Statement on Inpatient Glycemic Control (2015)  Target Ranges:  Prepandial:   less than 140 mg/dL      Peak postprandial:   less than 180 mg/dL (1-2 hours)      Critically ill patients:  140 - 180 mg/dL   Results for TRAEGER, DEMARCE (MRN [id number]) as of 01/01/2016 10:13  Ref. Range 12/31/2015 03:57 12/31/2015 10:10 12/31/2015 16:16 12/31/2015 19:58 01/01/2016 06:07  Glucose-Capillary Latest Ref Range: 65-99 mg/dL 258 (H) 165 (H) 266 (H) 238 (H) 145 (H)   Review of Glycemic Control  Diabetes history: DM2 Outpatient Diabetes medications: Glipizide 10 mg QAM, Metformin 1000 BID, Lantus 10 QHS (home med list has not that Lantus has not been started by patient yet) Current orders for Inpatient glycemic control:  Lantus 5 units QHS   Inpatient Diabetes Program Recommendations: Insulin - Meal Coverage: Please consider ordering Novolog 3 units TID with meals for meal coverage. Insulin-Correction: Was ordered Novolog correction scale which has been discontinued. While inpatient, please consider re-ordering Novolog correction ACHS.  Thanks, Barnie Alderman, RN, MSN, CDE Diabetes Coordinator Inpatient Diabetes Program [phone] (Team Pager from [place] to [place]) [phone] ([facility] office) [phone] [facility] office) [phone] [facility] office)

## 2016-01-01 NOTE — Hospital Discharge Follow-Up (Signed)
  Informed Ricki Miller, RN CM that the patient is known to the [facility] and had been referred to the [facility].  Met with him this afternoon and he explained that he will be discharged home with a wound VAC.  He said that he has an appointment with vascular scheduled for 01/05/16 and does not want to schedule an appointment with his PCP or the [facility] at this time.

## 2016-01-02 ENCOUNTER — Telehealth: Payer: Self-pay

## 2016-01-02 DIAGNOSIS — E11621 Type 2 diabetes mellitus with foot ulcer: Secondary | ICD-10-CM

## 2016-01-02 DIAGNOSIS — L97529 Non-pressure chronic ulcer of other part of left foot with unspecified severity: Secondary | ICD-10-CM

## 2016-01-02 LAB — HEMOGLOBIN A1C
HEMOGLOBIN A1C: 8 % — AB (ref 4.8–5.6)
Mean Plasma Glucose: 183 mg/dL

## 2016-01-02 LAB — GLUCOSE, CAPILLARY
Glucose-Capillary: 279 mg/dL — ABNORMAL HIGH (ref 65–99)
Glucose-Capillary: 244 mg/dL — ABNORMAL HIGH (ref 65–99)
Glucose-Capillary: 200 mg/dL — ABNORMAL HIGH (ref 65–99)

## 2016-01-02 MED ORDER — FLUOXETINE HCL 10 MG PO TABS: 10.0000 mg | ORAL_TABLET | ORAL | Status: DC

## 2016-01-02 MED FILL — ?FLUOXETINE HCL 10 MG TABLE: 10 | 30 days supply | Qty: 30 | Fill #0

## 2016-01-02 NOTE — Consult Note (Signed)
[facility] Nurse wound consult note Reason for Consult: surgical wound Notes reviewed after original consult on 01/01/16 at which time it was determined that the patient would be seen in consultation per ortho.  WOC reviewed ortho notes this am and it was noted that patient was to have [place] placed today, however this patient has an open wound and the Prevena is for closed incisional NPWT management.  Additionally the vendor for the NPWT devices was contacted to verify if she was aware of the patient's needs.  Reported that patient was not appropriate for Prevena but she thinks Dr. Marlou Sa wanted instillation therapy with VeraFlow device.  Attempted to contact Dr. Marlou Sa by phone at [place] decided to place NPWT with VAC Ulta and Veraflow dressing, this will give Dr. Marlou Sa the ability to use instillation therapy if he decides this is needed.   Additionally ordered Veraflow cassette for room in case needed for therapy. Contacted CM to notify of need for home VAC, patient has returned charity VAC that he had previously.  [employer] representative to work with CM to obtain new VAC for home at DC if deemed necessary.  Spoke with patient and he is concerned about DC plan, I have let him know medical and ortho team will make this decision. He is also concerned about appt. With vascular and has requested that the see him while inpatient. Reports no feeling in the LLE since this started.   Wound type: surgical  Measurement: 4.5cm x 3.5cm x 0.5cm  Wound bed: clean, pink, full thickness  Drainage (amount, consistency, odor) serosanguinous  Periwound: slightly macerated at wound edges, edematous including toes, palpable pulse.  Dressing procedure/placement/frequency: Window framed the periwound to protect this skin 1pc of Veraflow foam used to cover wound, sealed at 125mmHG. Patient tolerated well, reports he does not feel me manipulating the foot and wound.  Bolstered tubing off the skin with gauze.  ACE wrap used to cover dressing  and protect tubing from any pulling.  Patient aware to be careful when he is up.   Patient is aware he is not supposed to bear weight on the LLE, he reports having walker, but does not follow weight bearing restriction when I am in the room, up to bathroom.  WOC will follow along with you if VAC dressing to be changed Thursday and patient inpatient.  Will need bedside nurse to connect to home [facility] for DC to home when appropriate.  5N staff aware of procedure for instillation therapy.   Discussed POC with patient and bedside nurse.  Re consult if needed, will not follow at this time. Thanks  Jairon Ripberger [employer], RN,CNS, [place] [phone])

## 2016-01-02 NOTE — Discharge Summary (Signed)
Physician Discharge Summary  Shane [name] WYO:[id number] DOB: [birth year] DOA: 12/30/2015  PCP: Minerva Ends, MD  Admit date: 12/30/2015 Discharge date: 01/02/2016  Time spent: 25* minutes  Recommendations for Outpatient Follow-up:  1. Follow up [name] as outpatient  Discharge Diagnoses:  Active Problems:   DM2 (diabetes mellitus, type 2) (HCC)   Sensory disturbance   Essential hypertension   Anemia   Diabetic foot infection ([place])   Cellulitis and abscess, left foot   Wound infection ([place])   Type 2 diabetes mellitus with vascular disease (HCC)   Left foot infection   Diabetic foot ulcer (HCC)   Chronic diastolic CHF (congestive heart failure), NYHA class 1 ([place])   Discharge Condition: Stable  Diet recommendation: Diabetic diet  Filed Weights   12/30/15 1513 12/30/15 1959  Weight: 78.926 kg (174 lb) 72.12 kg (158 lb 15.9 oz)    History of present illness:  45 y.o. male with medical history significant of DM 2, chronic left foot wound, hypertension, anemia, left side left leg numbness and weakness thought to be secondary to conversion disorder, OSA not on C Pap, diastolic dysfunction, TIA, hyperlipidemia and anxiety   Presented with bleeding from surgical site his wound VAC has been removed on 12 July Complaining of some nausea vomiting chills and fever up to 102 at home. He endorses dyspnea on exertion ever since his discharge but no chest pain.  Patient with With known left foot diabetic ulcer status post amputation of second 2 and I&D of left dorsal foot abscess followed closely by orthopedics Dr. Marlou Sa. Has been recently admitted currently on doxycycline he had had workup in the past including penis Dopplers which were negative for DVT and bilateral ABI which were normal.> 1 blood cultures have been negative patient had 20 VAC placed on July 4 in sinus been discontinued. Patient had developed progressive numbness of his foot which extended all over up to his hip. He  has undergone MRI which showed no evidence of herniated disc or nerve root compression. MRI of the brain was unremarkable he was seen in consult by neurology felt that was secondary to conversion disorder with inconsistent physical exam  Hospital Course:  1. Diabetic foot infection- orthopedics consulted, continue IV antibiotics vancomycin and Flagyl. [name] saw the patient, MRI negative for osteomyelitis.Will discharge the patient on wound vac. Continue to take Doxycycline at home, he has two pills left. 2. Chronic anemia- hemoglobin 9.1 today, patient is [employer] and does not want blood transfusion. Anemia panel obtained showed iron 30, saturation 13%, TIBC 224. We'll start on ferrous gluconate 324 milligrams 3 times a day. 3. Hypertension- blood pressure is stable, continue lisinopril, Coreg 4. Diabetes mellitus- hold metformin, continue Lantus and sliding scale insulin with NovoLog 5. Bipolar disorder- continue Prozac, trazodone, Depakote. 6. History of diastolic dysfunction- currently euvolemic   Procedures    none  Consultations:  orthopedics  Discharge Exam: Filed Vitals:   01/02/16 0500 01/02/16 1330  BP: 166/92 167/90  Pulse: 93 89  Temp: 98 F (36.7 C)   Resp: 16 18    General: Appears in no acute distress Cardiovascular: S1s2 rrr Respiratory: clear bilaterally  Discharge Instructions   Discharge Instructions    Diet - low sodium heart healthy    Complete by:  As directed      Increase activity slowly    Complete by:  As directed           Current Discharge Medication List    CONTINUE  these medications which have CHANGED   Details  FLUoxetine (PROZAC) 10 MG tablet Take 1 tablet (10 mg total) by mouth every morning. Qty: 30 tablet, Refills: 3      CONTINUE these medications which have NOT CHANGED   Details  aspirin EC 325 MG tablet Take 1 tablet (325 mg total) by mouth daily. Qty: 30 tablet, Refills: 0   Associated Diagnoses: Transient  cerebral ischemia, unspecified transient cerebral ischemia type    atorvastatin (LIPITOR) 80 MG tablet Take 1 tablet (80 mg total) by mouth every morning. Qty: 30 tablet, Refills: 5   Associated Diagnoses: Type 2 diabetes mellitus with other neurologic complication (HCC)    Blood Glucose Monitoring Suppl (TRUE METRIX METER) w/Device KIT Use as directed Qty: 1 kit, Refills: 0    carvedilol (COREG) 12.5 MG tablet TAKE 1 TABLET BY MOUTH 2 TIMES DAILY WITH A MEAL Qty: 60 tablet, Refills: 3    divalproex (DEPAKOTE) 500 MG DR tablet Take 2 tablets (1,000 mg total) by mouth at bedtime. Qty: 30 tablet, Refills: 5   Associated Diagnoses: DMDD (disruptive mood dysregulation disorder) (HCC)    doxycycline (VIBRA-TABS) 100 MG tablet Take 1 tablet (100 mg total) by mouth 2 (two) times daily. X 2 weeks Qty: 28 tablet, Refills: 0    ferrous sulfate 325 (65 FE) MG tablet Take 1 tablet (325 mg total) by mouth 2 (two) times daily with a meal. Qty: 60 tablet, Refills: 5   Associated Diagnoses: Lower GI bleed; Anemia, unspecified anemia type    gabapentin (NEURONTIN) 100 MG capsule Take 1 capsule (100 mg total) by mouth 3 (three) times daily. Qty: 90 capsule, Refills: 3    glipiZIDE (GLUCOTROL) 10 MG tablet Take 1 tablet (10 mg total) by mouth 2 (two) times daily before a meal. Qty: 60 tablet, Refills: 5   Associated Diagnoses: Type 2 diabetes mellitus with other neurologic complication (HCC)    glucose blood (TRUE METRIX BLOOD GLUCOSE TEST) test strip Use as instructed Qty: 100 each, Refills: 12    Insulin Pen Needle (B-D ULTRAFINE III SHORT PEN) 31G X 8 MM MISC 1 application by Does not apply route daily. Qty: 30 each, Refills: 11   Associated Diagnoses: Type 2 diabetes mellitus with hyperosmolarity without coma, without long-term current use of insulin (HCC)    lisinopril (PRINIVIL,ZESTRIL) 40 MG tablet Take 1 tablet (40 mg total) by mouth daily. Qty: 30 tablet, Refills: 5   Associated  Diagnoses: Essential hypertension    metFORMIN (GLUCOPHAGE) 1000 MG tablet Take 1 tablet (1,000 mg total) by mouth 2 (two) times daily with a meal. Qty: 60 tablet, Refills: 5   Associated Diagnoses: Type 2 diabetes mellitus with other neurologic complication (HCC)    omeprazole (PRILOSEC) 20 MG capsule Take 1 capsule (20 mg total) by mouth daily. Qty: 30 capsule, Refills: 5   Associated Diagnoses: Gastroesophageal reflux disease without esophagitis    traZODone (DESYREL) 50 MG tablet Take 0.5-1 tablets (25-50 mg total) by mouth at bedtime as needed for sleep. Qty: 30 tablet, Refills: 3   Associated Diagnoses: Insomnia    TRUEPLUS LANCETS 28G MISC Use as directed Qty: 100 each, Refills: 12    Insulin Glargine (LANTUS SOLOSTAR) 100 UNIT/ML Solostar Pen Inject 10 Units into the skin daily at 10 pm. Qty: 5 pen, Refills: 3   Associated Diagnoses: Type 2 diabetes mellitus with hyperosmolarity without coma, without long-term current use of insulin (HCC)       Allergies  Allergen Reactions  . Other  Other (See Comments)    Red meat causes stomach pains, bloating and diarrhea  . Milk-Related Compounds Diarrhea and Other (See Comments)    Any dairy products  - diarrhea and bloating      The results of significant diagnostics from this hospitalization (including imaging, microbiology, ancillary and laboratory) are listed below for reference.    Significant Diagnostic Studies: Dg Chest 2 View  12/30/2015  CLINICAL DATA:  45 year old male with dyspnea EXAM: CHEST  2 VIEW COMPARISON:  Chest radiograph dated 12/16/2015 FINDINGS: The heart size and mediastinal contours are within normal limits. Both lungs are clear. The visualized skeletal structures are unremarkable. IMPRESSION: No active cardiopulmonary disease. Electronically Signed   By: Anner Crete M.D.   On: 12/30/2015 21:56   Dg Chest 2 View  12/16/2015  CLINICAL DATA:  Toe amputation recently.  Possible sepsis. EXAM: CHEST  2 VIEW  COMPARISON:  12/11/2015 FINDINGS: Lungs are adequately inflated without focal consolidation, effusion or pneumothorax. Cardiomediastinal silhouette and remainder of the exam is unchanged. IMPRESSION: No acute disease. Electronically Signed   By: Marin Olp M.D.   On: 12/16/2015 18:15   Dg Chest 2 View  12/11/2015  CLINICAL DATA:  TIA EXAM: CHEST  2 VIEW COMPARISON:  12/01/2015 FINDINGS: Heart and mediastinal contours are within normal limits. No focal opacities or effusions. No acute bony abnormality. IMPRESSION: No active cardiopulmonary disease. Electronically Signed   By: Rolm Baptise M.D.   On: 12/11/2015 07:40   Mr Brain Wo Contrast  12/23/2015  CLINICAL DATA:  45 year old male with progressive foot numbness following surgery for abscess. Abnormal sensation tracking now all up to the hip and left lower extremity weakness. Initial encounter. EXAM: MRI HEAD WITHOUT CONTRAST TECHNIQUE: Multiplanar, multiecho pulse sequences of the brain and surrounding structures were obtained without intravenous contrast. COMPARISON:  Brain MRI 12/11/2015, and earlier. FINDINGS: Major intracranial vascular flow voids are stable. No restricted diffusion to suggest acute infarction. No midline shift, mass effect, evidence of mass lesion, ventriculomegaly, extra-axial collection or acute intracranial hemorrhage. Cervicomedullary junction and pituitary are within normal limits. Cerebral volume is normal. [place] Cables and white matter signal remains normal. No chronic cerebral blood products. Visible internal auditory structures appear normal. Visualized paranasal sinuses and mastoids are stable and well pneumatized. Negative orbit and scalp soft tissues. Negative visualized cervical spine. Normal visible bone marrow signal. IMPRESSION: Stable and normal noncontrast MRI appearance of the brain. Electronically Signed   By: Genevie Ann M.D.   On: 12/23/2015 11:17   Mr Brain Wo Contrast  12/11/2015  CLINICAL DATA:  Recurrent acute  LEFT-sided numbness and weakness. Headache. EXAM: MRI HEAD WITHOUT CONTRAST TECHNIQUE: Multiplanar, multiecho pulse sequences of the brain and surrounding structures were obtained without intravenous contrast. COMPARISON:  None. FINDINGS: No evidence for acute infarction, hemorrhage, mass lesion, hydrocephalus, or extra-axial fluid. Normal cerebral volume. No significant white matter disease. Pituitary, pineal, and cerebellar tonsils unremarkable. No upper cervical lesions. Flow voids are maintained throughout the carotid, basilar, and vertebral arteries. There are no areas of chronic hemorrhage. Visualized calvarium, skull base, and upper cervical osseous structures unremarkable. Scalp and extracranial soft tissues, orbits, sinuses, and mastoids show no acute process. IMPRESSION: Negative exam.  No acute or focal intracranial findings. Electronically Signed   By: Staci Righter M.D.   On: 12/11/2015 13:34   Mr Lumbar Spine W Wo Contrast  12/20/2015  CLINICAL DATA:  Left foot wound with recent toe amputation. Diabetes, hypertension, hyperlipidemia. Left leg weakness EXAM: MRI LUMBAR SPINE WITHOUT  AND WITH CONTRAST TECHNIQUE: Multiplanar and multiecho pulse sequences of the lumbar spine were obtained without and with intravenous contrast. CONTRAST:  45m MULTIHANCE GADOBENATE DIMEGLUMINE 529 MG/ML IV SOLN COMPARISON:  None. FINDINGS: Segmentation:  Normal segmentation.  Lowest disc space L5-S1. Alignment:  Normal Vertebrae: Negative for fracture or mass. No evidence of spinal infection. Normal enhancement of the lumbar spine Conus medullaris: Extends to the L1 level and appears normal. Paraspinal and other soft tissues: Paraspinous muscles normal. No retroperitoneal mass or fluid collection. Diffuse bladder wall thickening, question cystitis. Disc levels: L1-2:  Negative L2-3:  Negative L3-4:  Negative L4-5: Mild disc and mild facet degeneration. Mild foraminal narrowing bilaterally. No significant spinal stenosis  L5-S1:  Negative IMPRESSION: Mild disc degeneration at L4-5 with mild foraminal narrowing bilaterally. No disc protrusion or spinal stenosis Bladder wall thickening, question cystitis Electronically Signed   By: CFranchot GalloM.D.   On: 12/20/2015 19:13   Mr Foot Left Wo Contrast  12/31/2015  CLINICAL DATA:  Bleeding at the surgical site. Fever, chills, diabetic foot ulcer EXAM: MRI OF THE LEFT FOREFOOT WITHOUT CONTRAST; MRI OF THE LEFT ANKLE WITHOUT CONTRAST TECHNIQUE: Multiplanar, multisequence MR imaging was performed. No intravenous contrast was administered. COMPARISON:  12/17/2015, 12/01/2015 FINDINGS: TENDONS Peroneal: Peroneal longus tendon intact. Peroneal brevis intact. Posteromedial: Posterior tibial tendon intact. Flexor hallucis longus tendon intact. Flexor digitorum longus tendon intact. Anterior: Tibialis anterior tendon intact. Extensor hallucis longus tendon intact Extensor digitorum longus tendon intact. Achilles:  Intact. Plantar Fascia: Intact. LIGAMENTS Lateral: Thickening of the anterior and posterior talofibular ligaments likely secondary to prior injury without disruption. Anterior and posterior tibiofibular ligaments intact. Medial: Deltoid ligament intact. Spring ligament intact. CARTILAGE Ankle Joint: No joint effusion. Normal ankle mortise. No chondral defect. Subtalar Joints/Sinus Tarsi: Normal subtalar joints. No subtalar joint effusion. Normal sinus tarsi. Bones: Amputation of the first phalanx. Mild subcortical reactive marrow edema in the first metatarsal head without significant T1 marrow signal abnormality or cortical destruction suggesting reactive marrow edema. Amputation of the second middle and distal phalanx. Cortical irregularity of the distal aspect of the second proximal phalanx with marrow edema which may reflect osteomyelitis versus postsurgical changes. No other marrow signal abnormality. No acute fracture or dislocation. Soft Tissue: Soft tissue wound along the  dorsal aspect of the foot with underlying mild soft tissue edema involving the dorsal aspect of the foot extending into the ankle. Small amount of air is seen tracking along the dorsal soft tissues. There is severe soft tissue edema between the first and second and second and third metatarsals concerning for infectious myositis. There is diffuse T2 signal throughout the plantar musculature likely neurogenic. IMPRESSION: 1. Soft tissue wound along the dorsal aspect of the foot with underlying mild soft tissue edema involving the dorsal aspect of the foot extending into the ankle concerning for cellulitis. Small amount of air is seen tracking along the dorsal soft tissues. Severe soft tissue edema between the first and second and second and third metatarsals concerning for infectious myositis. 2. Amputation of the first phalanx. Mild subcortical reactive marrow edema in the first metatarsal head without significant T1 marrow signal abnormality or cortical destruction suggesting reactive marrow edema. 3. Amputation of the second middle and distal phalanx. Cortical irregularity of the distal aspect of the second proximal phalanx with marrow edema which may reflect osteomyelitis versus postsurgical changes. 4. There is diffuse T2 signal throughout the plantar musculature likely neurogenic. Electronically Signed   By: HKathreen Devoid  On: 12/31/2015  15:33   Mr Foot Left Wo Contrast  12/17/2015  CLINICAL DATA:  Cellulitis of the left foot. Drainage of an abscess on December 02, 2015. EXAM: MRI OF THE LEFT FOOT WITHOUT CONTRAST TECHNIQUE: Multiplanar, multisequence MR imaging was performed. No intravenous contrast was administered. COMPARISON:  Radiographs dated 12/16/2015 and MRI dated 12/01/2015 FINDINGS: There has been marked reduction in the size of the subcutaneous abscess on the dorsum of the left foot. The abscess now measures approximately 4.3 cm in width and 3.9 cm in length and approximately 6 mm in depth. This  communicates with the soft tissue wound on the dorsum of the foot which is centered over the abscess. Marked reduction in the subcutaneous edema on the dorsum of the foot. There is no osteomyelitis or joint effusions.  No myositis. IMPRESSION: Marked improvement in the subcutaneous abscess on the dorsum of the foot. Residual abscess deep to the incision as described. Electronically Signed   By: Lorriane Shire M.D.   On: 12/17/2015 12:16   Mr Ankle Left  Wo Contrast  12/31/2015  CLINICAL DATA:  Bleeding at the surgical site. Fever, chills, diabetic foot ulcer EXAM: MRI OF THE LEFT FOREFOOT WITHOUT CONTRAST; MRI OF THE LEFT ANKLE WITHOUT CONTRAST TECHNIQUE: Multiplanar, multisequence MR imaging was performed. No intravenous contrast was administered. COMPARISON:  12/17/2015, 12/01/2015 FINDINGS: TENDONS Peroneal: Peroneal longus tendon intact. Peroneal brevis intact. Posteromedial: Posterior tibial tendon intact. Flexor hallucis longus tendon intact. Flexor digitorum longus tendon intact. Anterior: Tibialis anterior tendon intact. Extensor hallucis longus tendon intact Extensor digitorum longus tendon intact. Achilles:  Intact. Plantar Fascia: Intact. LIGAMENTS Lateral: Thickening of the anterior and posterior talofibular ligaments likely secondary to prior injury without disruption. Anterior and posterior tibiofibular ligaments intact. Medial: Deltoid ligament intact. Spring ligament intact. CARTILAGE Ankle Joint: No joint effusion. Normal ankle mortise. No chondral defect. Subtalar Joints/Sinus Tarsi: Normal subtalar joints. No subtalar joint effusion. Normal sinus tarsi. Bones: Amputation of the first phalanx. Mild subcortical reactive marrow edema in the first metatarsal head without significant T1 marrow signal abnormality or cortical destruction suggesting reactive marrow edema. Amputation of the second middle and distal phalanx. Cortical irregularity of the distal aspect of the second proximal phalanx with  marrow edema which may reflect osteomyelitis versus postsurgical changes. No other marrow signal abnormality. No acute fracture or dislocation. Soft Tissue: Soft tissue wound along the dorsal aspect of the foot with underlying mild soft tissue edema involving the dorsal aspect of the foot extending into the ankle. Small amount of air is seen tracking along the dorsal soft tissues. There is severe soft tissue edema between the first and second and second and third metatarsals concerning for infectious myositis. There is diffuse T2 signal throughout the plantar musculature likely neurogenic. IMPRESSION: 1. Soft tissue wound along the dorsal aspect of the foot with underlying mild soft tissue edema involving the dorsal aspect of the foot extending into the ankle concerning for cellulitis. Small amount of air is seen tracking along the dorsal soft tissues. Severe soft tissue edema between the first and second and second and third metatarsals concerning for infectious myositis. 2. Amputation of the first phalanx. Mild subcortical reactive marrow edema in the first metatarsal head without significant T1 marrow signal abnormality or cortical destruction suggesting reactive marrow edema. 3. Amputation of the second middle and distal phalanx. Cortical irregularity of the distal aspect of the second proximal phalanx with marrow edema which may reflect osteomyelitis versus postsurgical changes. 4. There is diffuse T2 signal throughout the plantar  musculature likely neurogenic. Electronically Signed   By: Kathreen Devoid   On: 12/31/2015 15:33   Dg Foot Complete Left  12/30/2015  CLINICAL DATA:  45 year old male with amputation of the left foot 3 weeks ago presenting with swelling. EXAM: LEFT FOOT - COMPLETE 3+ VIEW COMPARISON:  Left foot MRI dated 12/17/2015 FINDINGS: There is amputation of the great toe at the metatarsophalangeal joint. There is also amputation of the distal aspect of the proximal phalanx of the second toe.  There is no acute fracture or dislocation. There is no bony erosions or periosteal reaction. No significant arthropathy. There is diffuse soft tissue swelling of the forefoot and over the first metatarsal head. On the lateral projection there is an area of lucency in the soft tissues of the dorsum of the foot which may represent a skin ulcer or air within the soft tissue concerning for an abscess. Clinical correlation is recommended. No radiopaque foreign object identified. IMPRESSION: Amputation of the great toe and distal aspect of the second toe. Diffuse soft tissue swelling of the forefoot with an area of skin ulceration and soft tissue gas in the dorsum of the foot which may represent an abscess. Clinical correlation is recommended. Electronically Signed   By: Anner Crete M.D.   On: 12/30/2015 18:43   Dg Foot Complete Left  12/16/2015  CLINICAL DATA:  Left foot numbness.  Rule out osteomyelitis EXAM: LEFT FOOT - COMPLETE 3+ VIEW COMPARISON:  12/14/2015 FINDINGS: Amputation of the great toe at the level of the MTP joint. Amputation of the second toe at the level of the distal aspect of the proximal phalanx. Negative for osteomyelitis. No fracture or arthropathy. No change from the recent study. Arterial calcification. Wound on the dorsum of the foot. IMPRESSION: No interval change.  Negative for osteomyelitis. Electronically Signed   By: Franchot Gallo M.D.   On: 12/16/2015 19:11   Dg Foot Complete Left  12/14/2015  CLINICAL DATA:  Awoke with complete numbness LEFT foot, no feeling, no pain, surgery 1 week ago with partial amputation of second toe and I & D of infected area, swelling, history type II diabetes mellitus, CHF, hyperlipidemia, hypertension EXAM: LEFT FOOT - COMPLETE 3+ VIEW COMPARISON:  11/30/2015 FINDINGS: Osseous mineralization normal. Prior amputation great toe. Interval amputation second toe through head of proximal phalanx. Soft tissue swelling of forefoot and second toe stump. No  acute fracture, dislocation, or bone destruction. Scattered small vessel vascular calcification. IMPRESSION: Interval amputation of second toe through head of proximal phalanx. Prior amputation great toe. No acute osseous abnormalities. Electronically Signed   By: Lavonia Dana M.D.   On: 12/14/2015 15:15   Ct Head Code Stroke W/o Cm  12/10/2015  CLINICAL DATA:  45 year old male with code stroke. EXAM: CT HEAD WITHOUT CONTRAST TECHNIQUE: Contiguous axial images were obtained from the base of the skull through the vertex without intravenous contrast. COMPARISON:  Head CT dated 08/26/2015 and MRI dated 08/26/2015 FINDINGS: The ventricles and the sulci are appropriate in size for the patient's age. There is no intracranial hemorrhage. No midline shift or mass effect identified. The gray-white matter differentiation is preserved. The visualized paranasal sinuses and mastoid air cells are well aerated. The calvarium is intact. IMPRESSION: No acute intracranial pathology. These results were called by telephone at the time of interpretation on 12/10/2015 at 9:08 pm to Dr. Nicole Kindred, who verbally acknowledged these results. Electronically Signed   By: Anner Crete M.D.   On: 12/10/2015 21:16    Microbiology:  No results found for this or any previous visit (from the past 240 hour(s)).   Labs: Basic Metabolic Panel:  Recent Labs Lab 12/30/15 1521 12/31/15 0233 01/01/16 0233  NA 137 139 138  K 4.4 4.8 4.5  CL 106 107 105  CO2 24 28 28   GLUCOSE 194* 277* 173*  BUN 18 16 13   CREATININE 1.10 1.07 0.98  CALCIUM 9.0 8.7* 8.8*  MG  --  1.6*  --   PHOS  --  3.0  --    Liver Function Tests:  Recent Labs Lab 12/30/15 1521 12/31/15 0233  AST 20 13*  ALT 17 14*  ALKPHOS 59 52  BILITOT 0.5 0.3  PROT 6.7 5.4*  ALBUMIN 3.6 2.8*   No results for input(s): LIPASE, AMYLASE in the last 168 hours. No results for input(s): AMMONIA in the last 168 hours. CBC:  Recent Labs Lab 12/30/15 1521  12/31/15 0233 01/01/16 0233  WBC 8.1 7.7 6.8  HGB 9.7* 9.1* 9.5*  HCT 31.0* 28.4* 30.1*  MCV 95.4 94.4 94.4  PLT 235 206 217   Cardiac Enzymes: No results for input(s): CKTOTAL, CKMB, CKMBINDEX, TROPONINI in the last 168 hours. BNP: BNP (last 3 results)  Recent Labs  08/01/15 2106 12/01/15 0801  BNP 34.7 18.2    ProBNP (last 3 results) No results for input(s): PROBNP in the last 8760 hours.  CBG:  Recent Labs Lab 01/01/16 1218 01/01/16 1641 01/01/16 2125 01/02/16 0621 01/02/16 1105  GLUCAP 133* 250* 278* 279* 244*       Signed:  Oswald Hillock MD.  [facility] 01/02/2016, 3:33 PM

## 2016-01-02 NOTE — Progress Notes (Signed)
Inpatient Diabetes Program Recommendations  AACE/ADA: New Consensus Statement on Inpatient Glycemic Control (2015)  Target Ranges:  Prepandial:   less than 140 mg/dL      Peak postprandial:   less than 180 mg/dL (1-2 hours)      Critically ill patients:  140 - 180 mg/dL   Lab Results  Component Value Date   GLUCAP 279* 01/02/2016   HGBA1C 8.0* 01/01/2016    Review of Glycemic Control  Diabetes history: DM2 Outpatient Diabetes medications: Glipizide 10 mg QAM, Metformin 1000 BID, Lantus 10 QHS (home med list has not that Lantus has not been started by patient yet) Current orders for Inpatient glycemic control: Lantus 5 units QHS + Novolog correction 0-9 units tid  Inpatient Diabetes Program Recommendations: Insulin - Meal Coverage: Noted post prandial CBGs elevated. Please consider ordering Novolog 3 units TID with meals for meal coverage if eats > 50%.  Thank you, Nani Gasser. Joram Venson, RN, MSN, CDE Inpatient Glycemic Control Team Team Pager [phone] (8am-5pm) 01/02/2016 9:50 AM

## 2016-01-02 NOTE — Progress Notes (Signed)
Discharge instructions given, patient stated he understood, waiting on wound vac to arrive for patient to go home.

## 2016-01-02 NOTE — Telephone Encounter (Signed)
Call placed to Ricki Miller, RN CM informing her that the patient does not want to schedule an appointment with his PCP at this time. He wants to see vascular first. Inquired if if was possible for him to see vascular while he is still hospitalized.

## 2016-01-02 NOTE — Progress Notes (Signed)
Pharmacy Antibiotic Note Shane [name] is a 45 y.o. male admitted on 12/30/2015 with cellulitis surrounding recent L great toe amputation.  No definite osteo on MRI.  Currently on day 3 of Cefepime, Flagyl and vancomycin.   Plan: 1. Continue Cefepime 2 grams IV every 12 hours 2. Continue vancomycin 1000 mg every 12 hours; if remains stable will likely obtain VT on 7/19 to evaluate current dosing regimen; goal trough 10 - 15   Height: 5\' 8"  (172.7 cm) Weight: 158 lb 15.9 oz (72.12 kg) IBW/kg (Calculated) : 68.4  Temp (24hrs), Avg:98.4 F (36.9 C), Min:98 F (36.7 C), Max:98.9 F (37.2 C)   Recent Labs Lab 12/30/15 1521 12/30/15 1958 12/31/15 0233 01/01/16 0233  WBC 8.1  --  7.7 6.8  CREATININE 1.10  --  1.07 0.98  LATICACIDVEN  --  1.05  --   --     Estimated Creatinine Clearance: 91.1 mL/min (by C-G formula based on Cr of 0.98).    Allergies  Allergen Reactions  . Other Other (See Comments)    Red meat causes stomach pains, bloating and diarrhea  . Milk-Related Compounds Diarrhea and Other (See Comments)    Any dairy products  - diarrhea and bloating    Antimicrobials this admission: 7/15 Cefepime >>  7/15 Vancomycin  >>  7/15 Flagyl >>  Dose adjustments this admission: n/a  Microbiology results: None obtained this admission, prior cx's reviewed   Thank you for allowing pharmacy to be a part of this patient's care.  Vincenza Hews, PharmD, BCPS 01/02/2016, 9:04 AM Pager: [phone]

## 2016-01-02 NOTE — Progress Notes (Addendum)
Patient discharged home. All questions answered. Discharged with home wound vac. IV access removed. Transported home by wife.

## 2016-01-05 ENCOUNTER — Ambulatory Visit (HOSPITAL_COMMUNITY)
Admission: RE | Admit: 2016-01-05 | Discharge: 2016-01-05 | Disposition: A | Discharge status: Home / Self Care | Payer: Medicaid Other | Source: Ambulatory Visit | Attending: Vascular Surgery | Admitting: Vascular Surgery

## 2016-01-05 DIAGNOSIS — L97529 Non-pressure chronic ulcer of other part of left foot with unspecified severity: Secondary | ICD-10-CM | POA: Insufficient documentation

## 2016-01-05 DIAGNOSIS — E785 Hyperlipidemia, unspecified: Secondary | ICD-10-CM | POA: Insufficient documentation

## 2016-01-05 DIAGNOSIS — E11621 Type 2 diabetes mellitus with foot ulcer: Secondary | ICD-10-CM | POA: Diagnosis not present

## 2016-01-05 DIAGNOSIS — K219 Gastro-esophageal reflux disease without esophagitis: Secondary | ICD-10-CM | POA: Diagnosis not present

## 2016-01-05 DIAGNOSIS — F319 Bipolar disorder, unspecified: Secondary | ICD-10-CM | POA: Diagnosis not present

## 2016-01-05 DIAGNOSIS — E119 Type 2 diabetes mellitus without complications: Secondary | ICD-10-CM | POA: Insufficient documentation

## 2016-01-05 DIAGNOSIS — I110 Hypertensive heart disease with heart failure: Secondary | ICD-10-CM | POA: Insufficient documentation

## 2016-01-05 DIAGNOSIS — G4730 Sleep apnea, unspecified: Secondary | ICD-10-CM | POA: Insufficient documentation

## 2016-01-05 DIAGNOSIS — I5032 Chronic diastolic (congestive) heart failure: Secondary | ICD-10-CM | POA: Insufficient documentation

## 2016-01-05 DIAGNOSIS — F419 Anxiety disorder, unspecified: Secondary | ICD-10-CM | POA: Insufficient documentation

## 2016-01-08 MED FILL — DIVALPROEX SOD DR 500 MG TA: 500 | 30 days supply | Qty: 30 | Fill #4

## 2016-01-08 MED FILL — traZODone HCL 50 MG TABS: 50 | 30 days supply | Qty: 30 | Fill #1

## 2016-01-09 ENCOUNTER — Telehealth: Payer: Self-pay | Admitting: Family Medicine

## 2016-01-09 NOTE — Telephone Encounter (Signed)
Will forward to pcp

## 2016-01-09 NOTE — Telephone Encounter (Signed)
Patient states that he still has no feeling in left foot, and left foot is bleeding constantly and the left foot is starting to swell.

## 2016-01-09 NOTE — Telephone Encounter (Signed)
Please inform patient that arterial doppler of L from 01/05/16  was normal Normal blood flow to left leg

## 2016-01-09 NOTE — Telephone Encounter (Signed)
Patient should return to his orthopaedic surgeon

## 2016-01-09 NOTE — Telephone Encounter (Signed)
Pt. Called requesting his ultrasound results. Please f/u

## 2016-01-11 ENCOUNTER — Emergency Department (HOSPITAL_COMMUNITY): Payer: Medicaid Other

## 2016-01-11 ENCOUNTER — Inpatient Hospital Stay (HOSPITAL_COMMUNITY)
Admission: EM | Admit: 2016-01-11 | Discharge: 2016-01-23 | DRG: 987 | Disposition: A | Discharge status: Home Health | Payer: Medicaid Other | Attending: Internal Medicine | Admitting: Internal Medicine

## 2016-01-11 ENCOUNTER — Encounter (HOSPITAL_COMMUNITY): Payer: Self-pay | Admitting: Emergency Medicine

## 2016-01-11 DIAGNOSIS — E11621 Type 2 diabetes mellitus with foot ulcer: Principal | ICD-10-CM

## 2016-01-11 DIAGNOSIS — E11628 Type 2 diabetes mellitus with other skin complications: Secondary | ICD-10-CM | POA: Diagnosis present

## 2016-01-11 DIAGNOSIS — R079 Chest pain, unspecified: Secondary | ICD-10-CM

## 2016-01-11 DIAGNOSIS — I110 Hypertensive heart disease with heart failure: Secondary | ICD-10-CM | POA: Diagnosis present

## 2016-01-11 DIAGNOSIS — Z792 Long term (current) use of antibiotics: Secondary | ICD-10-CM

## 2016-01-11 DIAGNOSIS — L02611 Cutaneous abscess of right foot: Secondary | ICD-10-CM | POA: Diagnosis present

## 2016-01-11 DIAGNOSIS — L089 Local infection of the skin and subcutaneous tissue, unspecified: Secondary | ICD-10-CM

## 2016-01-11 DIAGNOSIS — IMO0002 Reserved for concepts with insufficient information to code with codable children: Secondary | ICD-10-CM | POA: Diagnosis present

## 2016-01-11 DIAGNOSIS — D649 Anemia, unspecified: Secondary | ICD-10-CM | POA: Diagnosis present

## 2016-01-11 DIAGNOSIS — F411 Generalized anxiety disorder: Secondary | ICD-10-CM

## 2016-01-11 DIAGNOSIS — M25474 Effusion, right foot: Secondary | ICD-10-CM

## 2016-01-11 DIAGNOSIS — G4730 Sleep apnea, unspecified: Secondary | ICD-10-CM | POA: Diagnosis present

## 2016-01-11 DIAGNOSIS — Z7982 Long term (current) use of aspirin: Secondary | ICD-10-CM

## 2016-01-11 DIAGNOSIS — M25476 Effusion, unspecified foot: Secondary | ICD-10-CM

## 2016-01-11 DIAGNOSIS — L0291 Cutaneous abscess, unspecified: Secondary | ICD-10-CM | POA: Diagnosis present

## 2016-01-11 DIAGNOSIS — E1165 Type 2 diabetes mellitus with hyperglycemia: Secondary | ICD-10-CM | POA: Diagnosis present

## 2016-01-11 DIAGNOSIS — E118 Type 2 diabetes mellitus with unspecified complications: Secondary | ICD-10-CM

## 2016-01-11 DIAGNOSIS — I5032 Chronic diastolic (congestive) heart failure: Secondary | ICD-10-CM | POA: Diagnosis present

## 2016-01-11 DIAGNOSIS — F419 Anxiety disorder, unspecified: Secondary | ICD-10-CM | POA: Diagnosis present

## 2016-01-11 DIAGNOSIS — K648 Other hemorrhoids: Secondary | ICD-10-CM | POA: Diagnosis present

## 2016-01-11 DIAGNOSIS — L97519 Non-pressure chronic ulcer of other part of right foot with unspecified severity: Secondary | ICD-10-CM | POA: Diagnosis present

## 2016-01-11 DIAGNOSIS — D509 Iron deficiency anemia, unspecified: Secondary | ICD-10-CM

## 2016-01-11 DIAGNOSIS — L97509 Non-pressure chronic ulcer of other part of unspecified foot with unspecified severity: Secondary | ICD-10-CM

## 2016-01-11 DIAGNOSIS — Z79899 Other long term (current) drug therapy: Secondary | ICD-10-CM

## 2016-01-11 DIAGNOSIS — E1140 Type 2 diabetes mellitus with diabetic neuropathy, unspecified: Secondary | ICD-10-CM | POA: Diagnosis present

## 2016-01-11 DIAGNOSIS — E1143 Type 2 diabetes mellitus with diabetic autonomic (poly)neuropathy: Secondary | ICD-10-CM | POA: Diagnosis present

## 2016-01-11 DIAGNOSIS — Z8673 Personal history of transient ischemic attack (TIA), and cerebral infarction without residual deficits: Secondary | ICD-10-CM

## 2016-01-11 DIAGNOSIS — Z91018 Allergy to other foods: Secondary | ICD-10-CM

## 2016-01-11 DIAGNOSIS — E8770 Fluid overload, unspecified: Secondary | ICD-10-CM | POA: Diagnosis not present

## 2016-01-11 DIAGNOSIS — E08621 Diabetes mellitus due to underlying condition with foot ulcer: Secondary | ICD-10-CM

## 2016-01-11 DIAGNOSIS — E785 Hyperlipidemia, unspecified: Secondary | ICD-10-CM | POA: Diagnosis present

## 2016-01-11 DIAGNOSIS — L02612 Cutaneous abscess of left foot: Secondary | ICD-10-CM | POA: Diagnosis present

## 2016-01-11 DIAGNOSIS — L03116 Cellulitis of left lower limb: Secondary | ICD-10-CM | POA: Diagnosis present

## 2016-01-11 DIAGNOSIS — Z09 Encounter for follow-up examination after completed treatment for conditions other than malignant neoplasm: Secondary | ICD-10-CM

## 2016-01-11 DIAGNOSIS — Z794 Long term (current) use of insulin: Secondary | ICD-10-CM

## 2016-01-11 DIAGNOSIS — J9601 Acute respiratory failure with hypoxia: Secondary | ICD-10-CM | POA: Diagnosis not present

## 2016-01-11 DIAGNOSIS — K3184 Gastroparesis: Secondary | ICD-10-CM | POA: Diagnosis present

## 2016-01-11 DIAGNOSIS — Z8249 Family history of ischemic heart disease and other diseases of the circulatory system: Secondary | ICD-10-CM

## 2016-01-11 DIAGNOSIS — L97529 Non-pressure chronic ulcer of other part of left foot with unspecified severity: Secondary | ICD-10-CM | POA: Diagnosis present

## 2016-01-11 DIAGNOSIS — I10 Essential (primary) hypertension: Secondary | ICD-10-CM | POA: Diagnosis present

## 2016-01-11 DIAGNOSIS — F319 Bipolar disorder, unspecified: Secondary | ICD-10-CM | POA: Diagnosis present

## 2016-01-11 DIAGNOSIS — K921 Melena: Secondary | ICD-10-CM | POA: Diagnosis not present

## 2016-01-11 DIAGNOSIS — K219 Gastro-esophageal reflux disease without esophagitis: Secondary | ICD-10-CM | POA: Diagnosis present

## 2016-01-11 DIAGNOSIS — L03115 Cellulitis of right lower limb: Secondary | ICD-10-CM

## 2016-01-11 DIAGNOSIS — Z89422 Acquired absence of other left toe(s): Secondary | ICD-10-CM

## 2016-01-11 DIAGNOSIS — L0390 Cellulitis, unspecified: Secondary | ICD-10-CM

## 2016-01-11 DIAGNOSIS — R0789 Other chest pain: Secondary | ICD-10-CM

## 2016-01-11 DIAGNOSIS — Z91011 Allergy to milk products: Secondary | ICD-10-CM

## 2016-01-11 HISTORY — DX: Other specified anxiety disorders: F41.8

## 2016-01-11 HISTORY — DX: Vitamin D deficiency, unspecified: E55.9

## 2016-01-11 NOTE — ED Triage Notes (Signed)
Pt. tripped and injured his right foot this evening , reports pain with swelling , pain increases with movement .

## 2016-01-12 ENCOUNTER — Encounter (HOSPITAL_COMMUNITY): Payer: Self-pay

## 2016-01-12 ENCOUNTER — Inpatient Hospital Stay (HOSPITAL_COMMUNITY): Payer: Medicaid Other

## 2016-01-12 DIAGNOSIS — K219 Gastro-esophageal reflux disease without esophagitis: Secondary | ICD-10-CM | POA: Diagnosis present

## 2016-01-12 DIAGNOSIS — E8770 Fluid overload, unspecified: Secondary | ICD-10-CM | POA: Diagnosis not present

## 2016-01-12 DIAGNOSIS — J9601 Acute respiratory failure with hypoxia: Secondary | ICD-10-CM | POA: Diagnosis not present

## 2016-01-12 DIAGNOSIS — I10 Essential (primary) hypertension: Secondary | ICD-10-CM | POA: Diagnosis not present

## 2016-01-12 DIAGNOSIS — L02612 Cutaneous abscess of left foot: Secondary | ICD-10-CM | POA: Diagnosis present

## 2016-01-12 DIAGNOSIS — E08621 Diabetes mellitus due to underlying condition with foot ulcer: Secondary | ICD-10-CM | POA: Diagnosis not present

## 2016-01-12 DIAGNOSIS — F419 Anxiety disorder, unspecified: Secondary | ICD-10-CM | POA: Diagnosis present

## 2016-01-12 DIAGNOSIS — I5032 Chronic diastolic (congestive) heart failure: Secondary | ICD-10-CM | POA: Diagnosis present

## 2016-01-12 DIAGNOSIS — L97519 Non-pressure chronic ulcer of other part of right foot with unspecified severity: Secondary | ICD-10-CM | POA: Diagnosis present

## 2016-01-12 DIAGNOSIS — Z7982 Long term (current) use of aspirin: Secondary | ICD-10-CM | POA: Diagnosis not present

## 2016-01-12 DIAGNOSIS — L03115 Cellulitis of right lower limb: Secondary | ICD-10-CM | POA: Diagnosis present

## 2016-01-12 DIAGNOSIS — Z8673 Personal history of transient ischemic attack (TIA), and cerebral infarction without residual deficits: Secondary | ICD-10-CM | POA: Diagnosis not present

## 2016-01-12 DIAGNOSIS — K921 Melena: Secondary | ICD-10-CM | POA: Diagnosis not present

## 2016-01-12 DIAGNOSIS — L089 Local infection of the skin and subcutaneous tissue, unspecified: Secondary | ICD-10-CM | POA: Diagnosis not present

## 2016-01-12 DIAGNOSIS — L03116 Cellulitis of left lower limb: Secondary | ICD-10-CM | POA: Diagnosis present

## 2016-01-12 DIAGNOSIS — E785 Hyperlipidemia, unspecified: Secondary | ICD-10-CM | POA: Diagnosis present

## 2016-01-12 DIAGNOSIS — E11621 Type 2 diabetes mellitus with foot ulcer: Secondary | ICD-10-CM | POA: Diagnosis present

## 2016-01-12 DIAGNOSIS — L97529 Non-pressure chronic ulcer of other part of left foot with unspecified severity: Secondary | ICD-10-CM | POA: Diagnosis present

## 2016-01-12 DIAGNOSIS — F319 Bipolar disorder, unspecified: Secondary | ICD-10-CM | POA: Diagnosis present

## 2016-01-12 DIAGNOSIS — D649 Anemia, unspecified: Secondary | ICD-10-CM | POA: Diagnosis not present

## 2016-01-12 DIAGNOSIS — Z89422 Acquired absence of other left toe(s): Secondary | ICD-10-CM | POA: Diagnosis not present

## 2016-01-12 DIAGNOSIS — E1159 Type 2 diabetes mellitus with other circulatory complications: Secondary | ICD-10-CM | POA: Diagnosis not present

## 2016-01-12 DIAGNOSIS — Z794 Long term (current) use of insulin: Secondary | ICD-10-CM | POA: Diagnosis not present

## 2016-01-12 DIAGNOSIS — E1140 Type 2 diabetes mellitus with diabetic neuropathy, unspecified: Secondary | ICD-10-CM | POA: Diagnosis present

## 2016-01-12 DIAGNOSIS — D509 Iron deficiency anemia, unspecified: Secondary | ICD-10-CM | POA: Diagnosis not present

## 2016-01-12 DIAGNOSIS — L0390 Cellulitis, unspecified: Secondary | ICD-10-CM | POA: Diagnosis not present

## 2016-01-12 DIAGNOSIS — L02611 Cutaneous abscess of right foot: Secondary | ICD-10-CM | POA: Diagnosis present

## 2016-01-12 DIAGNOSIS — Z792 Long term (current) use of antibiotics: Secondary | ICD-10-CM | POA: Diagnosis not present

## 2016-01-12 DIAGNOSIS — E1169 Type 2 diabetes mellitus with other specified complication: Secondary | ICD-10-CM | POA: Diagnosis not present

## 2016-01-12 DIAGNOSIS — L0291 Cutaneous abscess, unspecified: Secondary | ICD-10-CM | POA: Diagnosis not present

## 2016-01-12 DIAGNOSIS — I110 Hypertensive heart disease with heart failure: Secondary | ICD-10-CM | POA: Diagnosis present

## 2016-01-12 DIAGNOSIS — G4730 Sleep apnea, unspecified: Secondary | ICD-10-CM | POA: Diagnosis present

## 2016-01-12 DIAGNOSIS — K3184 Gastroparesis: Secondary | ICD-10-CM | POA: Diagnosis present

## 2016-01-12 LAB — URINE MICROSCOPIC-ADD ON: WBC, UA: NONE SEEN WBC/hpf (ref 0–5)

## 2016-01-12 LAB — GLUCOSE, CAPILLARY
GLUCOSE-CAPILLARY: 271 mg/dL — AB (ref 65–99)
GLUCOSE-CAPILLARY: 239 mg/dL — AB (ref 65–99)
Glucose-Capillary: 244 mg/dL — ABNORMAL HIGH (ref 65–99)
Glucose-Capillary: 275 mg/dL — ABNORMAL HIGH (ref 65–99)

## 2016-01-12 LAB — CBC WITH DIFFERENTIAL/PLATELET
BASOS ABS: 0 10*3/uL (ref 0.0–0.1)
Basophils Relative: 0 %
EOS PCT: 3 %
Eosinophils Absolute: 0.3 10*3/uL (ref 0.0–0.7)
HEMATOCRIT: 28.3 % — AB (ref 39.0–52.0)
HEMOGLOBIN: 9.3 g/dL — AB (ref 13.0–17.0)
LYMPHS ABS: 1.9 10*3/uL (ref 0.7–4.0)
LYMPHS PCT: 21 %
MCH: 30.7 pg (ref 26.0–34.0)
MCHC: 32.9 g/dL (ref 30.0–36.0)
MCV: 93.4 fL (ref 78.0–100.0)
MONOS PCT: 8 %
Monocytes Absolute: 0.7 10*3/uL (ref 0.1–1.0)
Neutro Abs: 6.2 10*3/uL (ref 1.7–7.7)
Neutrophils Relative %: 68 %
PLATELETS: 273 10*3/uL (ref 150–400)
RBC: 3.03 MIL/uL — AB (ref 4.22–5.81)
RDW: 13.1 % (ref 11.5–15.5)
WBC: 9.1 10*3/uL (ref 4.0–10.5)

## 2016-01-12 LAB — BASIC METABOLIC PANEL
ANION GAP: 8 (ref 5–15)
BUN: 16 mg/dL (ref 6–20)
CHLORIDE: 105 mmol/L (ref 101–111)
CO2: 26 mmol/L (ref 22–32)
Calcium: 9.2 mg/dL (ref 8.9–10.3)
Creatinine, Ser: 1.12 mg/dL (ref 0.61–1.24)
GFR calc Af Amer: >60 mL/min (ref >60)
GFR calc non Af Amer: >60 mL/min (ref >60)
GLUCOSE: 288 mg/dL — AB (ref 65–99)
POTASSIUM: 4.9 mmol/L (ref 3.5–5.1)
Sodium: 139 mmol/L (ref 135–145)

## 2016-01-12 LAB — URINALYSIS, ROUTINE W REFLEX MICROSCOPIC
BILIRUBIN URINE: NEGATIVE
Glucose, UA: >1000 mg/dL — AB
Hgb urine dipstick: NEGATIVE
KETONES UR: NEGATIVE mg/dL
LEUKOCYTES UA: NEGATIVE
NITRITE: NEGATIVE
PH: 6 (ref 5.0–8.0)
PROTEIN: 30 mg/dL — AB
Specific Gravity, Urine: 1.022 (ref 1.005–1.030)

## 2016-01-12 LAB — I-STAT CG4 LACTIC ACID, ED: Lactic Acid, Venous: 1.14 mmol/L (ref 0.5–1.9)

## 2016-01-12 MED ORDER — METRONIDAZOLE 500 MG PO TABS
500.0000 mg | ORAL_TABLET | Freq: Three times a day (TID) | ORAL | Status: DC
  Administered 2016-01-12 (×2): 500 mg via ORAL
  Filled 2016-01-12 (×2): qty 1

## 2016-01-12 MED ORDER — LISINOPRIL 40 MG PO TABS
40.0000 mg | ORAL_TABLET | Freq: Every day | ORAL | Status: DC
  Administered 2016-01-12 – 2016-01-23 (×11): 40 mg via ORAL
  Filled 2016-01-12 (×11): qty 1

## 2016-01-12 MED ORDER — ATORVASTATIN CALCIUM 80 MG PO TABS
80.0000 mg | ORAL_TABLET | Freq: Every day | ORAL | Status: DC
  Administered 2016-01-12 – 2016-01-22 (×11): 80 mg via ORAL
  Filled 2016-01-12 (×11): qty 1

## 2016-01-12 MED ORDER — CEFAZOLIN IN D5W 1 GM/50ML IV SOLN
1.0000 g | Freq: Three times a day (TID) | INTRAVENOUS | Status: DC
  Administered 2016-01-12 – 2016-01-15 (×9): 1 g via INTRAVENOUS
  Filled 2016-01-12 (×12): qty 50

## 2016-01-12 MED ORDER — GABAPENTIN 100 MG PO CAPS
100.0000 mg | ORAL_CAPSULE | Freq: Three times a day (TID) | ORAL | Status: DC
  Administered 2016-01-12 – 2016-01-16 (×13): 100 mg via ORAL
  Filled 2016-01-12 (×13): qty 1

## 2016-01-12 MED ORDER — PIPERACILLIN-TAZOBACTAM 3.375 G IVPB 30 MIN
3.3750 g | Freq: Once | INTRAVENOUS | Status: AC
  Administered 2016-01-12: 3.375 g via INTRAVENOUS
  Filled 2016-01-12: qty 50

## 2016-01-12 MED ORDER — ASPIRIN EC 325 MG PO TBEC
325.0000 mg | DELAYED_RELEASE_TABLET | Freq: Every day | ORAL | Status: DC
  Administered 2016-01-12 – 2016-01-15 (×4): 325 mg via ORAL
  Filled 2016-01-12 (×4): qty 1

## 2016-01-12 MED ORDER — INSULIN ASPART 100 UNIT/ML ~~LOC~~ SOLN
0.0000 [IU] | Freq: Three times a day (TID) | SUBCUTANEOUS | Status: DC
  Administered 2016-01-12: 5 [IU] via SUBCUTANEOUS
  Administered 2016-01-12 (×2): 8 [IU] via SUBCUTANEOUS
  Administered 2016-01-13: 3 [IU] via SUBCUTANEOUS
  Administered 2016-01-13: 2 [IU] via SUBCUTANEOUS
  Administered 2016-01-13: 11 [IU] via SUBCUTANEOUS
  Administered 2016-01-14: 3 [IU] via SUBCUTANEOUS
  Administered 2016-01-14: 5 [IU] via SUBCUTANEOUS
  Administered 2016-01-14: 3 [IU] via SUBCUTANEOUS
  Administered 2016-01-15: 2 [IU] via SUBCUTANEOUS
  Administered 2016-01-15: 3 [IU] via SUBCUTANEOUS
  Administered 2016-01-15: 5 [IU] via SUBCUTANEOUS
  Administered 2016-01-16: 2 [IU] via SUBCUTANEOUS
  Administered 2016-01-16: 3 [IU] via SUBCUTANEOUS
  Administered 2016-01-17: 11 [IU] via SUBCUTANEOUS
  Administered 2016-01-17 – 2016-01-18 (×3): 2 [IU] via SUBCUTANEOUS
  Administered 2016-01-19: 3 [IU] via SUBCUTANEOUS
  Administered 2016-01-19: 2 [IU] via SUBCUTANEOUS
  Administered 2016-01-23: 3 [IU] via SUBCUTANEOUS

## 2016-01-12 MED ORDER — FERROUS SULFATE 325 (65 FE) MG PO TABS
325.0000 mg | ORAL_TABLET | Freq: Every day | ORAL | Status: DC
  Administered 2016-01-12 – 2016-01-18 (×6): 325 mg via ORAL
  Filled 2016-01-12 (×6): qty 1

## 2016-01-12 MED ORDER — METFORMIN HCL 500 MG PO TABS: 1000.0000 mg | ORAL_TABLET | Freq: Two times a day (BID) | ORAL | Status: DC

## 2016-01-12 MED ORDER — VANCOMYCIN HCL IN DEXTROSE 1-5 GM/200ML-% IV SOLN
1000.0000 mg | Freq: Once | INTRAVENOUS | Status: AC
  Administered 2016-01-12: 1000 mg via INTRAVENOUS
  Filled 2016-01-12: qty 200

## 2016-01-12 MED ORDER — ENOXAPARIN SODIUM 40 MG/0.4ML ~~LOC~~ SOLN
40.0000 mg | SUBCUTANEOUS | Status: DC
  Administered 2016-01-12 – 2016-01-15 (×4): 40 mg via SUBCUTANEOUS
  Filled 2016-01-12 (×4): qty 0.4

## 2016-01-12 MED ORDER — SACCHAROMYCES BOULARDII 250 MG PO CAPS
250.0000 mg | ORAL_CAPSULE | Freq: Two times a day (BID) | ORAL | Status: DC
  Administered 2016-01-12 – 2016-01-23 (×19): 250 mg via ORAL
  Filled 2016-01-12 (×19): qty 1

## 2016-01-12 MED ORDER — TRAZODONE HCL 50 MG PO TABS
25.0000 mg | ORAL_TABLET | Freq: Every evening | ORAL | Status: DC | PRN
  Administered 2016-01-15: 50 mg via ORAL
  Filled 2016-01-12: qty 1

## 2016-01-12 MED ORDER — DEXTROSE 5 % IV SOLN
2.0000 g | INTRAVENOUS | Status: DC
  Administered 2016-01-12: 2 g via INTRAVENOUS
  Filled 2016-01-12 (×2): qty 2

## 2016-01-12 MED ORDER — CARVEDILOL 12.5 MG PO TABS
12.5000 mg | ORAL_TABLET | Freq: Every day | ORAL | Status: DC
  Administered 2016-01-12 – 2016-01-21 (×10): 12.5 mg via ORAL
  Filled 2016-01-12 (×11): qty 1

## 2016-01-12 MED ORDER — HYDROCODONE-ACETAMINOPHEN 5-325 MG PO TABS
1.0000 | ORAL_TABLET | ORAL | Status: AC | PRN
  Administered 2016-01-12 – 2016-01-15 (×11): 2 via ORAL
  Filled 2016-01-12 (×12): qty 2

## 2016-01-12 MED ORDER — GLUCERNA SHAKE PO LIQD
237.0000 mL | Freq: Two times a day (BID) | ORAL | Status: DC
  Administered 2016-01-13 – 2016-01-18 (×9): 237 mL via ORAL

## 2016-01-12 MED ORDER — FLUOXETINE HCL 10 MG PO CAPS
10.0000 mg | ORAL_CAPSULE | Freq: Every day | ORAL | Status: DC
  Administered 2016-01-12 – 2016-01-16 (×5): 10 mg via ORAL
  Filled 2016-01-12 (×5): qty 1

## 2016-01-12 MED ORDER — PANTOPRAZOLE SODIUM 40 MG PO TBEC
40.0000 mg | DELAYED_RELEASE_TABLET | Freq: Every day | ORAL | Status: DC
Start: 2016-01-12 — End: 2016-01-23
  Administered 2016-01-12 – 2016-01-23 (×10): 40 mg via ORAL
  Filled 2016-01-12 (×10): qty 1

## 2016-01-12 MED ORDER — INSULIN GLARGINE 100 UNIT/ML ~~LOC~~ SOLN
10.0000 [IU] | Freq: Every day | SUBCUTANEOUS | Status: DC
  Administered 2016-01-12: 10 [IU] via SUBCUTANEOUS
  Filled 2016-01-12 (×2): qty 0.1

## 2016-01-12 MED ORDER — DIVALPROEX SODIUM 500 MG PO DR TAB
1000.0000 mg | DELAYED_RELEASE_TABLET | Freq: Every day | ORAL | Status: DC
  Administered 2016-01-12 – 2016-01-22 (×11): 1000 mg via ORAL
  Filled 2016-01-12 (×11): qty 2

## 2016-01-12 MED ORDER — VANCOMYCIN HCL IN DEXTROSE 750-5 MG/150ML-% IV SOLN
750.0000 mg | Freq: Three times a day (TID) | INTRAVENOUS | Status: DC
  Administered 2016-01-12 – 2016-01-16 (×12): 750 mg via INTRAVENOUS
  Filled 2016-01-12 (×15): qty 150

## 2016-01-12 MED ORDER — GLIPIZIDE 10 MG PO TABS: 10.0000 mg | ORAL_TABLET | Freq: Every day | ORAL | Status: DC

## 2016-01-12 NOTE — ED Provider Notes (Signed)
[facility] DEPT Provider Note   CSN: [id number] Arrival date & time: 01/11/16  2324  First Provider Contact:  First MD Initiated Contact with Patient 01/12/16 0021      History   Chief Complaint Chief Complaint  Patient presents with  . Foot Pain    HPI Shane [name] is a 45 y.o. male.  The history is provided by the patient, medical records and the spouse. No language interpreter was used.     Shane [name] is a 45 y.o. male  with a PMH of DM2, HTN, HLD, CHF who presents to the Emergency Department complaining of right foot pain that began tonight. Patient states he was trying to move from wheelchair when he tripped. He did not think that he had sustained an injury at that time, but approximately an hour later he noticed that his right foot was throbbing. When he looked at the foot, it was red with some bruising and very swollen which prompted him to come to ED for evaluation. Admits to warmth to the touch of the area. Denies fever, chills. Of note, patient was admitted on 7/15 and discharged on 7/18 for cellulitis and abscess of the LEFT foot where he was on IV vanc and flagyl, then discharged home on Doxycycline which he has successfully completed. Sugar's running in 300's the last few days per wife at bedside.    Past Medical History:  Diagnosis Date  . Anemia   . Anxiety   . Bipolar disorder ([place])   . Chest pain    a. 2015 Reportedly normal stress test in [place].  Marland Kitchen Chronic diastolic CHF (congestive heart failure) (HCC)    a.03/2015 Echo: EF 55-60%, Gr 1 DD, mild MR, triv PR.  . Depression   . GERD (gastroesophageal reflux disease)   . Hyperlipidemia   . Hypertension    a. 08/2014 Admitted with hypertensive urgency.  . Refusal of blood transfusions as patient is Jehovah's Witness   . Sleep apnea   . TIA (transient ischemic attack) 08/2014; 03/2015   a. 08/2014 in setting of hypertensive urgency.  . Type II diabetes mellitus [facility])     Patient Active Problem List   Diagnosis Date Noted  . Left foot infection 12/30/2015  . Diabetic foot ulcer ([place]) 12/30/2015  . Chronic diastolic CHF (congestive heart failure), NYHA class 1 ([place]) 12/30/2015  . Cellulitis of left foot   . Wound dehiscence   . Wound infection ([place]) 12/16/2015  . SIRS (systemic inflammatory response syndrome) ([place]) 12/16/2015  . Type 2 diabetes mellitus with vascular disease ([place]) 12/16/2015  . Pain of toe of left foot   . Nausea & vomiting 12/10/2015  . Numbness 12/10/2015  . Cellulitis and abscess, left foot 12/02/2015  . Foot infection 12/02/2015  . Depression with anxiety 12/01/2015  . Chest pain 12/01/2015  . Diabetic foot infection ([place])   . Left foot pain 11/24/2015  . Insomnia 11/21/2015  . Anxiety state 09/04/2015  . GERD (gastroesophageal reflux disease) 08/18/2015  . Amputated toe of left foot ([place]) 08/18/2015  . Chronic anemia 08/01/2015  . Lower GI bleed 07/04/2015  . Erectile dysfunction 07/04/2015  . Anemia 04/24/2015  . AKI (acute kidney injury) ([place]) 04/24/2015  . Left arm weakness 04/02/2015  . Sensory disturbance 04/02/2015  . Essential hypertension 04/02/2015  . Hemispheric carotid artery syndrome   . HLD (hyperlipidemia)   . Internal carotid artery stenosis   . TIA (transient ischemic attack) 08/25/2014  . DM2 (diabetes mellitus, type 2) ([place]) 08/25/2014  .  Headache     Past Surgical History:  Procedure Laterality Date  . AMPUTATION Left 08/03/2015   Procedure: AMPUTATION LEFT GREAT TOE;  Surgeon: Newt Minion, MD;  Location: [facility];  Service: Orthopedics;  Laterality: Left;  . AMPUTATION Left 12/02/2015   Procedure: amputation of left 2nd digit  . APPLICATION OF WOUND VAC Left 12/19/2015   Procedure: APPLICATION OF WOUND VAC;  Surgeon: Meredith Pel, MD;  Location: [facility];  Service: Orthopedics;  Laterality: Left;  . CIRCUMCISION    . I&D EXTREMITY Left 12/02/2015   Procedure: IRRIGATION AND DEBRIDEMENT OF FOOT; LEFT SECOND TOE AMPUTATION;   Surgeon: Meredith Pel, MD;  Location: [facility];  Service: Orthopedics;  Laterality: Left;  . I&D EXTREMITY Left 12/19/2015   Procedure: I & D LEFT FOOT WITH BEADS ;  Surgeon: Meredith Pel, MD;  Location: [facility];  Service: Orthopedics;  Laterality: Left;  . INGUINAL HERNIA REPAIR Bilateral ~ 1983- ~ 1986  . TONSILLECTOMY  ~ 1985       Home Medications    Prior to Admission medications   Medication Sig Start Date End Date Taking? Authorizing Provider  aspirin EC 325 MG tablet Take 1 tablet (325 mg total) by mouth daily. Patient taking differently: Take 325 mg by mouth every morning.  09/04/15   Maren Reamer, MD  atorvastatin (LIPITOR) 80 MG tablet Take 1 tablet (80 mg total) by mouth every morning. 08/18/15   Josalyn Funches, MD  Blood Glucose Monitoring Suppl (TRUE METRIX METER) w/Device KIT Use as directed 12/29/15   Josalyn Funches, MD  carvedilol (COREG) 12.5 MG tablet TAKE 1 TABLET BY MOUTH 2 TIMES DAILY WITH A MEAL Patient taking differently: TAKE 1 TABLET BY MOUTH IN THE MORNING. 05/17/15   Josalyn Funches, MD  divalproex (DEPAKOTE) 500 MG DR tablet Take 2 tablets (1,000 mg total) by mouth at bedtime. 12/04/15   Venetia Maxon Rama, MD  doxycycline (VIBRA-TABS) 100 MG tablet Take 1 tablet (100 mg total) by mouth 2 (two) times daily. X 2 weeks 12/22/15   Ripudeep Krystal Eaton, MD  ferrous sulfate 325 (65 FE) MG tablet Take 1 tablet (325 mg total) by mouth 2 (two) times daily with a meal. Patient taking differently: Take 325 mg by mouth daily with breakfast.  08/18/15   Boykin Nearing, MD  FLUoxetine (PROZAC) 10 MG tablet Take 1 tablet (10 mg total) by mouth every morning. 01/02/16   Oswald Hillock, MD  gabapentin (NEURONTIN) 100 MG capsule Take 1 capsule (100 mg total) by mouth 3 (three) times daily. 12/23/15   Ripudeep Krystal Eaton, MD  glipiZIDE (GLUCOTROL) 10 MG tablet Take 1 tablet (10 mg total) by mouth 2 (two) times daily before a meal. Patient taking differently: Take 10 mg by mouth daily before  breakfast.  08/18/15   Boykin Nearing, MD  glucose blood (TRUE METRIX BLOOD GLUCOSE TEST) test strip Use as instructed 12/29/15   Boykin Nearing, MD  Insulin Glargine (LANTUS SOLOSTAR) 100 UNIT/ML Solostar Pen Inject 10 Units into the skin daily at 10 pm. 12/28/15   Josalyn Funches, MD  Insulin Pen Needle (B-D ULTRAFINE III SHORT PEN) 31G X 8 MM MISC 1 application by Does not apply route daily. 12/28/15   Josalyn Funches, MD  lisinopril (PRINIVIL,ZESTRIL) 40 MG tablet Take 1 tablet (40 mg total) by mouth daily. Patient taking differently: Take 40 mg by mouth every morning.  08/18/15   Josalyn Funches, MD  metFORMIN (GLUCOPHAGE) 1000 MG tablet Take 1  tablet (1,000 mg total) by mouth 2 (two) times daily with a meal. 12/22/15   Ripudeep Krystal Eaton, MD  omeprazole (PRILOSEC) 20 MG capsule Take 1 capsule (20 mg total) by mouth daily. 08/18/15   Josalyn Funches, MD  traZODone (DESYREL) 50 MG tablet Take 0.5-1 tablets (25-50 mg total) by mouth at bedtime as needed for sleep. 11/21/15   Boykin Nearing, MD  TRUEPLUS LANCETS 28G MISC Use as directed 12/29/15   Boykin Nearing, MD    Family History Family History  Problem Relation Age of Onset  . Hypertension Mother   . Diabetes Mother   . Hyperlipidemia Mother   . Heart disease Mother     s/p pacemaker  . Diabetes Father   . Hypertension Father   . Stroke Father   . Heart attack Father     first MI @ 45.  . Stroke Brother     Social History Social History  Substance Use Topics  . Smoking status: Never Smoker  . Smokeless tobacco: Never Used  . Alcohol use No     Allergies   Other and Milk-related compounds   Review of Systems Review of Systems  Constitutional: Negative for chills and fever.  Cardiovascular: Positive for leg swelling. Negative for chest pain and palpitations.  Musculoskeletal: Positive for arthralgias.  Skin: Positive for color change.   10 Systems reviewed and are negative for acute change except as noted in the  HPI.   Physical Exam Updated Vital Signs BP 132/86 (BP Location: Right Arm)   Pulse 95   Temp 97.8 F (36.6 C) (Oral)   SpO2 99%   Physical Exam  Constitutional: He is oriented to person, place, and time. He appears well-developed and well-nourished. No distress.  HENT:  Head: Normocephalic and atraumatic.  Cardiovascular: Normal rate, regular rhythm, normal heart sounds and intact distal pulses.   Pulmonary/Chest: Effort normal and breath sounds normal. No respiratory distress.  Abdominal: Soft. Bowel sounds are normal. He exhibits no distension. There is no tenderness.  Musculoskeletal:  Right foot with diffuse swelling, erythema, and warmth. Full ROM. 2+ DP pulse. Sensation intact.   Neurological: He is alert and oriented to person, place, and time.  Skin: Skin is warm and dry. He is not diaphoretic.  Nursing note and vitals reviewed.    ED Treatments / Results  Labs (all labs ordered are listed, but only abnormal results are displayed) Labs Reviewed  CBC WITH DIFFERENTIAL/PLATELET  BASIC METABOLIC PANEL    EKG  EKG Interpretation None       Radiology Dg Foot Complete Right  Result Date: 01/11/2016 CLINICAL DATA:  45 year old male with right foot pain and swelling. EXAM: RIGHT FOOT COMPLETE - 3+ VIEW COMPARISON:  Radiograph dated 09/01/2015 the FINDINGS: There is no acute fracture or dislocation. The bones are well mineralized. No arthritic changes. There is diffuse soft tissue swelling of the foot. No radiopaque foreign object or soft tissue gas identified. IMPRESSION: No acute osseous pathology. Diffuse soft tissue swelling of the foot. Electronically Signed   By: Anner Crete M.D.   On: 01/11/2016 23:59   Procedures Procedures (including critical care time)  Medications Ordered in ED Medications - No data to display   Initial Impression / Assessment and Plan / ED Course  I have reviewed the triage vital signs and the nursing notes.  Pertinent labs &  imaging results that were available during my care of the patient were reviewed by me and considered in my medical decision making (see  chart for details).  Clinical Course   Gilles Trimpe presents to ED fast track with complaints of right foot pain. On exam, right foot with diffuse swelling, erythema, and tenderness concerning for infection. Patient with PMH of DM and recent left foot infection and abscess requiring admission. X-ray obtained which was reviewed, showing no bony abnormalities but diffuse soft tissue swelling of the foot. Given exam findings and patient's extensive past medical history, will transfer to acute side of emergency department for further workup and evaluation. Case discussed with attending Dr. Betsey Holiday.   Final Clinical Impressions(s) / ED Diagnoses   Final diagnoses:  None    New Prescriptions New Prescriptions   No medications on file     [facility] Schuyler Olden, PA-C 01/12/16 0111    Orpah Greek, MD 01/12/16 [id number]

## 2016-01-12 NOTE — H&P (Addendum)
History and Physical    Shane [name] MRN:[id number] DOB: 03/05/1971 DOA: 01/11/2016   PCP: FUNCHES, JOSALYN C, MD Chief Complaint:  Chief Complaint  Patient presents with  . Foot Pain    HPI: Shane [name] is a 44 y.o. male with medical history significant of DM2.  Patient recently hospitalized at the start of this month for cellulitis and abscess of the left foot, has open wound that has been being treated with wound vac to dorsal surface.  Seeing Dr. Dean and Dr. Duda.  He has been on Doxycyline since discharge he reports.  He does report that recently he has been noticing more drainage from the LEFT wound site; however, what really brings him in tonight is that he now has erythema, edema, to the RIGHT foot and ankle on the dorsal surface (contralateral foot) that he noticed tonight.  ED Course: Patient started on vancomycin.  Review of Systems: As per HPI otherwise 10 point review of systems negative.    Past Medical History:  Diagnosis Date  . Anemia   . Anxiety   . Bipolar disorder (HCC)   . Chest pain    a. 2015 Reportedly normal stress test in [place].  . Chronic diastolic CHF (congestive heart failure) (HCC)    a.03/2015 Echo: EF 55-60%, Gr 1 DD, mild MR, triv PR.  . Depression   . GERD (gastroesophageal reflux disease)   . Hyperlipidemia   . Hypertension    a. 08/2014 Admitted with hypertensive urgency.  . Refusal of blood transfusions as patient is Jehovah's Witness   . Sleep apnea   . TIA (transient ischemic attack) 08/2014; 03/2015   a. 08/2014 in setting of hypertensive urgency.  . Type II diabetes mellitus (HCC)     Past Surgical History:  Procedure Laterality Date  . AMPUTATION Left 08/03/2015   Procedure: AMPUTATION LEFT GREAT TOE;  Surgeon: Marcus Duda V, MD;  Location: [facility] OR;  Service: Orthopedics;  Laterality: Left;  . AMPUTATION Left 12/02/2015   Procedure: amputation of left 2nd digit  . APPLICATION OF WOUND VAC Left 12/19/2015   Procedure: APPLICATION OF WOUND  VAC;  Surgeon: Scott Gregory Dean, MD;  Location: [facility] OR;  Service: Orthopedics;  Laterality: Left;  . CIRCUMCISION    . I&D EXTREMITY Left 12/02/2015   Procedure: IRRIGATION AND DEBRIDEMENT OF FOOT; LEFT SECOND TOE AMPUTATION;  Surgeon: Scott Gregory Dean, MD;  Location: [facility] OR;  Service: Orthopedics;  Laterality: Left;  . I&D EXTREMITY Left 12/19/2015   Procedure: I & D LEFT FOOT WITH BEADS ;  Surgeon: Scott Gregory Dean, MD;  Location: [facility] OR;  Service: Orthopedics;  Laterality: Left;  . INGUINAL HERNIA REPAIR Bilateral ~ 1983- ~ 1986  . TONSILLECTOMY  ~ 1985     reports that he has never smoked. He has never used smokeless tobacco. He reports that he does not drink alcohol or use drugs.  Allergies  Allergen Reactions  . Other Other (See Comments)    Red meat causes stomach pains, bloating and diarrhea  . Milk-Related Compounds Diarrhea and Other (See Comments)    Any dairy products  - diarrhea and bloating    Family History  Problem Relation Age of Onset  . Hypertension Mother   . Diabetes Mother   . Hyperlipidemia Mother   . Heart disease Mother     s/p pacemaker  . Diabetes Father   . Hypertension Father   . Stroke Father   . Heart attack Father     first MI @   49.  . Stroke Brother      Prior to Admission medications   Medication Sig Start Date End Date Taking? Authorizing Provider  aspirin EC 325 MG tablet Take 1 tablet (325 mg total) by mouth daily. 09/04/15  Yes Dawn T Langeland, MD  atorvastatin (LIPITOR) 80 MG tablet Take 1 tablet (80 mg total) by mouth every morning. 08/18/15  Yes Josalyn Funches, MD  carvedilol (COREG) 12.5 MG tablet TAKE 1 TABLET BY MOUTH 2 TIMES DAILY WITH A MEAL Patient taking differently: TAKE 1 TABLET BY MOUTH IN THE MORNING. 05/17/15  Yes Josalyn Funches, MD  divalproex (DEPAKOTE) 500 MG DR tablet Take 2 tablets (1,000 mg total) by mouth at bedtime. 12/04/15  Yes Christina P Rama, MD  ferrous sulfate 325 (65 FE) MG tablet Take 1 tablet (325 mg  total) by mouth 2 (two) times daily with a meal. Patient taking differently: Take 325 mg by mouth daily with breakfast.  08/18/15  Yes Josalyn Funches, MD  FLUoxetine (PROZAC) 10 MG tablet Take 1 tablet (10 mg total) by mouth every morning. 01/02/16  Yes Gagan S Lama, MD  gabapentin (NEURONTIN) 100 MG capsule Take 1 capsule (100 mg total) by mouth 3 (three) times daily. 12/23/15  Yes Ripudeep K Rai, MD  glipiZIDE (GLUCOTROL) 10 MG tablet Take 1 tablet (10 mg total) by mouth 2 (two) times daily before a meal. Patient taking differently: Take 10 mg by mouth daily before breakfast.  08/18/15  Yes Josalyn Funches, MD  Insulin Glargine (LANTUS SOLOSTAR) 100 UNIT/ML Solostar Pen Inject 10 Units into the skin daily at 10 pm. 12/28/15  Yes Josalyn Funches, MD  lisinopril (PRINIVIL,ZESTRIL) 40 MG tablet Take 1 tablet (40 mg total) by mouth daily. 08/18/15  Yes Josalyn Funches, MD  metFORMIN (GLUCOPHAGE) 1000 MG tablet Take 1 tablet (1,000 mg total) by mouth 2 (two) times daily with a meal. 12/22/15  Yes Ripudeep K Rai, MD  omeprazole (PRILOSEC) 20 MG capsule Take 1 capsule (20 mg total) by mouth daily. 08/18/15  Yes Josalyn Funches, MD  traZODone (DESYREL) 50 MG tablet Take 0.5-1 tablets (25-50 mg total) by mouth at bedtime as needed for sleep. 11/21/15  Yes Josalyn Funches, MD  Blood Glucose Monitoring Suppl (TRUE METRIX METER) w/Device KIT Use as directed 12/29/15   Josalyn Funches, MD  glucose blood (TRUE METRIX BLOOD GLUCOSE TEST) test strip Use as instructed 12/29/15   Josalyn Funches, MD  Insulin Pen Needle (B-D ULTRAFINE III SHORT PEN) 31G X 8 MM MISC 1 application by Does not apply route daily. 12/28/15   Josalyn Funches, MD  TRUEPLUS LANCETS 28G MISC Use as directed 12/29/15   Josalyn Funches, MD    Physical Exam: Vitals:   01/12/16 0245 01/12/16 0300 01/12/16 0315 01/12/16 0330  BP: 105/70 144/97 137/97 132/87  Pulse: 87 87 87 87  Temp:      TempSrc:      SpO2: 99% 98% 100% 99%      Constitutional: NAD,  calm, comfortable Eyes: PERRL, lids and conjunctivae normal ENMT: Mucous membranes are moist. Posterior pharynx clear of any exudate or lesions.Normal dentition.  Neck: normal, supple, no masses, no thyromegaly Respiratory: clear to auscultation bilaterally, no wheezing, no crackles. Normal respiratory effort. No accessory muscle use.  Cardiovascular: Regular rate and rhythm, no murmurs / rubs / gallops. No extremity edema. 2+ pedal pulses. No carotid bruits.  Abdomen: no tenderness, no masses palpated. No hepatosplenomegaly. Bowel sounds positive.  Musculoskeletal: no clubbing / cyanosis. No joint deformity upper   and lower extremities. Good ROM, no contractures. Normal muscle tone.  Skin: Left foot has ulcer with granulation tissue and some clear discharge on dorsal surface, significant edema.  Right foot has no ulcer, but has erythema of foot, ankle, and shin. Neurologic: CN 2-12 grossly intact. Sensation intact, DTR normal. Strength 5/5 in all 4.  Psychiatric: Normal judgment and insight. Alert and oriented x 3. Normal mood.    Labs on Admission: I have personally reviewed following labs and imaging studies  CBC:  Recent Labs Lab 01/12/16 0110  WBC 9.1  NEUTROABS 6.2  HGB 9.3*  HCT 28.3*  MCV 93.4  PLT 273   Basic Metabolic Panel:  Recent Labs Lab 01/12/16 0110  NA 139  K 4.9  CL 105  CO2 26  GLUCOSE 288*  BUN 16  CREATININE 1.12  CALCIUM 9.2   GFR: Estimated Creatinine Clearance: 79.7 mL/min (by C-G formula based on SCr of 1.12 mg/dL). Liver Function Tests: No results for input(s): AST, ALT, ALKPHOS, BILITOT, PROT, ALBUMIN in the last 168 hours. No results for input(s): LIPASE, AMYLASE in the last 168 hours. No results for input(s): AMMONIA in the last 168 hours. Coagulation Profile: No results for input(s): INR, PROTIME in the last 168 hours. Cardiac Enzymes: No results for input(s): CKTOTAL, CKMB, CKMBINDEX, TROPONINI in the last 168 hours. BNP (last 3  results) No results for input(s): PROBNP in the last 8760 hours. HbA1C: No results for input(s): HGBA1C in the last 72 hours. CBG: No results for input(s): GLUCAP in the last 168 hours. Lipid Profile: No results for input(s): CHOL, HDL, LDLCALC, TRIG, CHOLHDL, LDLDIRECT in the last 72 hours. Thyroid Function Tests: No results for input(s): TSH, T4TOTAL, FREET4, T3FREE, THYROIDAB in the last 72 hours. Anemia Panel: No results for input(s): VITAMINB12, FOLATE, FERRITIN, TIBC, IRON, RETICCTPCT in the last 72 hours. Urine analysis:    Component Value Date/Time   COLORURINE YELLOW 04/24/2015 1056   APPEARANCEUR CLEAR 04/24/2015 1056   LABSPEC 1.013 04/24/2015 1056   PHURINE 5.0 04/24/2015 1056   GLUCOSEU NEGATIVE 04/24/2015 1056   HGBUR MODERATE (A) 04/24/2015 1056   BILIRUBINUR NEGATIVE 04/24/2015 1056   KETONESUR NEGATIVE 04/24/2015 1056   PROTEINUR NEGATIVE 04/24/2015 1056   UROBILINOGEN 0.2 04/24/2015 1056   NITRITE NEGATIVE 04/24/2015 1056   LEUKOCYTESUR NEGATIVE 04/24/2015 1056   Sepsis Labs: @LABRCNTIP(procalcitonin:4,lacticidven:4) )No results found for this or any previous visit (from the past 240 hour(s)).   Radiological Exams on Admission: Dg Foot Complete Right  Result Date: 01/11/2016 CLINICAL DATA:  44-year-old male with right foot pain and swelling. EXAM: RIGHT FOOT COMPLETE - 3+ VIEW COMPARISON:  Radiograph dated 09/01/2015 the FINDINGS: There is no acute fracture or dislocation. The bones are well mineralized. No arthritic changes. There is diffuse soft tissue swelling of the foot. No radiopaque foreign object or soft tissue gas identified. IMPRESSION: No acute osseous pathology. Diffuse soft tissue swelling of the foot. Electronically Signed   By: Arash  Radparvar M.D.   On: 01/11/2016 23:59   EKG: Independently reviewed.  Assessment/Plan Principal Problem:   Cellulitis of right ankle Active Problems:   Essential hypertension   Diabetic foot infection (HCC)    Cellulitis and abscess, left foot   Type 2 diabetes mellitus with vascular disease (HCC)   Cellulitis of left ankle   Cellulitis of right ankle -  Occurred while still on doxycycline to treat cellulitis and abscess of left ankle from hospital stay earlier this month  Putting patient on broad spectrum coverage   with rocephin, flagyl, and vanc.  BCx ordered but these will be collected post vanc that he has already received in the ED  X ray of right foot is unimpressive other than soft tissue swelling   Increased drainage from left foot wound -  Re-imaging  Increased ABx coverage as above  Diabetic foot pathway and wound care  Day team may wish to d/w Dr. Sharol Given depending especially on results of X-ray of left foot which is pending.  HTN - continue home meds  DM2 - BGLs running higher than usual at home over recent days  Holding metformin and glipizide  Continue lantus 10  Adding moderate scale SSI AC/HS   DVT prophylaxis: Lovenox Code Status: Full Family Communication: No family at bedside Consults called: None Admission status: Admit to inpatient   Etta Quill DO [facility] Pager [phone] from 7PM-7AM  If 7AM-7PM, please contact the day physician for the patient [email or web address] Password [facility]  01/12/2016, 4:19 AM

## 2016-01-12 NOTE — Progress Notes (Signed)
Received order for ABI. Patient had ABI 12/02/15 that was within normal limits.   Landry Mellow, RDMS, RVT Vascular lab

## 2016-01-12 NOTE — ED Notes (Signed)
Pt's foot cellulitic.  Pt will require IV maintenance and possible MRI.  MD given report by PA

## 2016-01-12 NOTE — ED Notes (Signed)
Patient transported to X-ray 

## 2016-01-12 NOTE — Consult Note (Addendum)
Shane [name] wound consult note Reason for Consult: Right leg and foot with cellulitis; generalized edema and erythremia, no open wounds.  Pt is already on IV systemic coverage and no topical treatment is needed at this time for RLE.  Consult requested for left foot wound.  Pt has been followed by Dr Sharol Given prior to admission and wound was assessed in his office on Monday.   Wound type: Chronic full thickness wound to left anterior foot Measurement: 5X4X.3cm Wound bed: 85% red, interspersed with 15% yellow throughout the wound bed Drainage (amount, consistency, odor) Small amt yellow drainage, no odor Periwound: Intact skin surrounding Dressing procedure/placement/frequency: Pt is well informed regarding Dr Jess Barters plan of care; he ordered special wound healing socks and a dressing is not supposed to be used underneath.  Pt states his wife is bringing the stockings in from home.  Please refer to Dr Sharol Given for further questions regarding plan of care. Please re-consult if further assistance is needed.  Thank-you,  Julien Girt MSN, [place], [place], [place], [place]

## 2016-01-12 NOTE — ED Notes (Signed)
Attempted to call report

## 2016-01-12 NOTE — Progress Notes (Signed)
Pharmacy Antibiotic Note  Shane [name] is a 45 y.o. male admitted on 01/11/2016 with diabetic infection w/ high risk of MRSA.  Pharmacy has been consulted for vancomycin dosing.  Has previously been hospitalized for cellulitis and abscess, was sent home on doxycycline, course not yet completed.  Plan: Vancomycin 1000mg  in ED then 750mg  IV every 8 hours.  Goal trough 15-20 mcg/mL.   Temp (24hrs), Avg:97.8 F (36.6 C), Min:97.8 F (36.6 C), Max:97.8 F (36.6 C)   Recent Labs Lab 01/12/16 0110 01/12/16 0256  WBC 9.1  --   CREATININE 1.12  --   LATICACIDVEN  --  1.14    Estimated Creatinine Clearance: 79.7 mL/min (by C-G formula based on SCr of 1.12 mg/dL).    Allergies  Allergen Reactions  . Other Other (See Comments)    Red meat causes stomach pains, bloating and diarrhea  . Milk-Related Compounds Diarrhea and Other (See Comments)    Any dairy products  - diarrhea and bloating    Thank you for allowing pharmacy to be a part of this patient's care.  Wynona Neat, PharmD, BCPS  01/12/2016 4:15 AM

## 2016-01-12 NOTE — Progress Notes (Signed)
Pharmacy Antibiotic Note  Shane [name] is a 45 y.o. male admitted on 01/11/2016 with diabetic infection w/ high risk of MRSA.  Pharmacy has been consulted for vancomycin dosing.  Has previously been hospitalized for cellulitis and abscess, was sent home on doxycycline, course not yet completed.   Pharmacy is consulted to dose cefazolin for cellulitis. Per Dr. Rise Mu note, will continue vancomycin for 1 more day then narrow.   Plan: Vancomycin 750mg  IV every 8 hours.  Goal trough 15-20 mcg/mL. Cefazolin 1g IV q8h  Monitor culture data, renal function and clinical course   Temp (24hrs), Avg:98.1 F (36.7 C), Min:97.8 F (36.6 C), Max:98.4 F (36.9 C)   Recent Labs Lab 01/12/16 0110 01/12/16 0256  WBC 9.1  --   CREATININE 1.12  --   LATICACIDVEN  --  1.14    Estimated Creatinine Clearance: 79.7 mL/min (by C-G formula based on SCr of 1.12 mg/dL).    Allergies  Allergen Reactions  . Other Other (See Comments)    Red meat causes stomach pains, bloating and diarrhea  . Milk-Related Compounds Diarrhea and Other (See Comments)    Any dairy products  - diarrhea and bloating     Andrey Cota. Diona Foley, PharmD, [place] Clinical Pharmacist Pager [phone] 01/12/2016 4:20 PM

## 2016-01-12 NOTE — Progress Notes (Signed)
PROGRESS NOTE  Shane [name]  [id number] DOB: [birth year] DOA: 01/11/2016 PCP: Minerva Ends, MD  Brief Narrative:   Shane [name] is a 45 y.o. male with medical history significant of DM2.  Patient recently hospitalized at the start of this month for cellulitis and abscess of the left foot and had an open wound that was treated with wound vac to dorsal surface.  Seeing Dr. Marlou Sa and Dr. Sharol Given.  He was discharged on Doxycyline.  He reports increased ulceration of the distal portion of the ulcer which previously was flush with the surface of his skin, but no increased erythema, induration, odor, or discharge.  He presented due to increased erythema of the right lower extremity which he had injured on his wheelchair.    Assessment & Plan:   Principal Problem:   Cellulitis of right ankle Active Problems:   Essential hypertension   Diabetic foot infection (HCC)   Cellulitis and abscess, left foot   Type 2 diabetes mellitus with vascular disease (HCC)   Cellulitis of left ankle  Cellulitis of the right ankle, line drawn to mark area of the erythema.  Source likely the traumatic wound to the dorsum of his foot.  Most likely this reflects strep infection which is not as well treated by doxycycline -  Change ceftriaxone and flagyl to ancef  -  Continue vancomycin until tomorrow, then resume doxycycline if cultures are negative -  Start florastor -  Start hydrocodone for pain  Possible increased drainage and worsening ulceration of the left foot -  Dr. Sharol Given notified -  X-ray negative for bony involvement or subcutaneous air  Hypertension, blood pressures stable -  Continue carvedilol and lisinopril  Diabetes mellitus type 2, CBG higher than usual over the last few days.  Diabetic neuropathy -  Hold metformin and glipizide -  Continue lantus 10 units and SSI -  Continue gabapentin  GERD, stable, continue PPI  Depression/anxiety, stable, continue fluoxetine  Urinary frequency,  may be due to elevated CBG causing osmotic diuresis -  Check UA and urine culture  DVT prophylaxis:  lovenox Code Status:  full Family Communication:  Patient alone Disposition Plan:  Pending clinical improvement in cellulitis of the right foot.  Dr. Sharol Given to reevaluate the left foot   Consultants:   Dr. Sharol Given, Orthopedics  Procedures:  none  Antimicrobials:   Vancomycin 7/28 >   Ceftriaxone and flagyl 7/28  Ancef 7/28 >     Subjective:  Persistent erythema of the right foot which does not appear to be worsening.  Pain in the right foot.  Denies increased discharge to the left foot ulcer, redness of the left foot ulcer.  Has been having some increased urinary frequency this week and chills.  More frequent BMs but not diarrhea.    Objective: Vitals:   01/12/16 0415 01/12/16 0430 01/12/16 0520 01/12/16 1400  BP: 143/81 138/95 138/79 109/61  Pulse: 87 84 85 92  Resp:   16 18  Temp:   98 F (36.7 C) 98.4 F (36.9 C)  TempSrc:   Oral   SpO2: 100% 100% 100% 95%   No intake or output data in the 24 hours ending 01/12/16 1606 There were no vitals filed for this visit.  Examination:  General exam:  Adult male.  No acute distress.  HEENT:  NCAT, MMM Respiratory system: Clear to auscultation bilaterally Cardiovascular system: Regular rate and rhythm, normal S1/S2. No murmurs, rubs, gallops or clicks.  Warm extremities Gastrointestinal system: Normal active  bowel sounds, soft, nondistended, nontender. MSK:  Pink erythema of the right foot sparing the dorsum of the foot where he has some partially healed abrasion with surrounding bruising.  Pinkness extends up the leg to the mid-calf region and was marked at the time of my exam.  Left foot ulcer is about 5cm in diameter with overall healthy appearing granulation tissue but does have some ulceration/early tunneling of the distal portion of the ulcer.  No foul odor.   Neuro:  Grossly intact    Data Reviewed: I have personally  reviewed following labs and imaging studies  CBC:  Recent Labs Lab 01/12/16 0110  WBC 9.1  NEUTROABS 6.2  HGB 9.3*  HCT 28.3*  MCV 93.4  PLT 123456   Basic Metabolic Panel:  Recent Labs Lab 01/12/16 0110  NA 139  K 4.9  CL 105  CO2 26  GLUCOSE 288*  BUN 16  CREATININE 1.12  CALCIUM 9.2   GFR: Estimated Creatinine Clearance: 79.7 mL/min (by C-G formula based on SCr of 1.12 mg/dL). Liver Function Tests: No results for input(s): AST, ALT, ALKPHOS, BILITOT, PROT, ALBUMIN in the last 168 hours. No results for input(s): LIPASE, AMYLASE in the last 168 hours. No results for input(s): AMMONIA in the last 168 hours. Coagulation Profile: No results for input(s): INR, PROTIME in the last 168 hours. Cardiac Enzymes: No results for input(s): CKTOTAL, CKMB, CKMBINDEX, TROPONINI in the last 168 hours. BNP (last 3 results) No results for input(s): PROBNP in the last 8760 hours. HbA1C: No results for input(s): HGBA1C in the last 72 hours. CBG:  Recent Labs Lab 01/12/16 0706 01/12/16 1150  GLUCAP 271* 244*   Lipid Profile: No results for input(s): CHOL, HDL, LDLCALC, TRIG, CHOLHDL, LDLDIRECT in the last 72 hours. Thyroid Function Tests: No results for input(s): TSH, T4TOTAL, FREET4, T3FREE, THYROIDAB in the last 72 hours. Anemia Panel: No results for input(s): VITAMINB12, FOLATE, FERRITIN, TIBC, IRON, RETICCTPCT in the last 72 hours. Urine analysis:    Component Value Date/Time   COLORURINE YELLOW 04/24/2015 1056   APPEARANCEUR CLEAR 04/24/2015 1056   LABSPEC 1.013 04/24/2015 1056   PHURINE 5.0 04/24/2015 1056   GLUCOSEU NEGATIVE 04/24/2015 1056   HGBUR MODERATE (A) 04/24/2015 1056   BILIRUBINUR NEGATIVE 04/24/2015 1056   KETONESUR NEGATIVE 04/24/2015 1056   PROTEINUR NEGATIVE 04/24/2015 1056   UROBILINOGEN 0.2 04/24/2015 1056   NITRITE NEGATIVE 04/24/2015 1056   LEUKOCYTESUR NEGATIVE 04/24/2015 1056   Sepsis Labs: @LABRCNTIP (procalcitonin:4,lacticidven:4)  )No  results found for this or any previous visit (from the past 240 hour(s)).    Radiology Studies: Dg Foot Complete Left  Result Date: 01/12/2016 CLINICAL DATA:  45 year old male with open diabetic ulcer on top of the left foot EXAM: LEFT FOOT - COMPLETE 3+ VIEW COMPARISON:  Foot MRI dated 12/31/2015 and radiograph dated 12/30/2015 FINDINGS: There is amputation of the first metatarsophalangeal joint as well as amputation of the distal aspect of the proximal phalanx of the second toe. There is soft tissue thickening over the amputation site without soft tissue gas. There is no periosteal reaction or bony erosion to suggest osteomyelitis. No acute fracture or dislocation identified. There is again diffuse soft tissue swelling of the foot primarily over the dorsum of the foot. There is a skin wound in the dorsum of the forefoot as seen on the prior study. No radiopaque foreign object identified. IMPRESSION: Diffuse soft tissue swelling of the foot with a wound in the dorsal aspect of the forefoot as seen on the  prior radiograph. No interval change since the prior radiograph. Electronically Signed   By: Anner Crete M.D.   On: 01/12/2016 05:31  Dg Foot Complete Right  Result Date: 01/11/2016 CLINICAL DATA:  45 year old male with right foot pain and swelling. EXAM: RIGHT FOOT COMPLETE - 3+ VIEW COMPARISON:  Radiograph dated 09/01/2015 the FINDINGS: There is no acute fracture or dislocation. The bones are well mineralized. No arthritic changes. There is diffuse soft tissue swelling of the foot. No radiopaque foreign object or soft tissue gas identified. IMPRESSION: No acute osseous pathology. Diffuse soft tissue swelling of the foot. Electronically Signed   By: Anner Crete M.D.   On: 01/11/2016 23:59    Scheduled Meds: . aspirin EC  325 mg Oral Daily  . atorvastatin  80 mg Oral q1800  . carvedilol  12.5 mg Oral Q breakfast  . cefTRIAXone (ROCEPHIN)  IV  2 g Intravenous Q24H   And  . metroNIDAZOLE   500 mg Oral Q8H  . divalproex  1,000 mg Oral QHS  . enoxaparin (LOVENOX) injection  40 mg Subcutaneous Q24H  . feeding supplement (GLUCERNA SHAKE)  237 mL Oral BID BM  . ferrous sulfate  325 mg Oral Q breakfast  . FLUoxetine  10 mg Oral Daily  . gabapentin  100 mg Oral TID  . insulin aspart  0-15 Units Subcutaneous TID WC  . insulin glargine  10 Units Subcutaneous Q2200  . lisinopril  40 mg Oral Daily  . pantoprazole  40 mg Oral Daily  . vancomycin  750 mg Intravenous Q8H   Continuous Infusions:    LOS: 0 days    Time spent: 30 min    Janece Canterbury, MD [facility] Pager [phone]  If 7PM-7AM, please contact night-coverage [email or web address] Password [email or web address] 01/12/2016, 4:06 PM

## 2016-01-12 NOTE — Consult Note (Signed)
ORTHOPAEDIC CONSULTATION  REQUESTING PHYSICIAN: Janece Canterbury, MD  Chief Complaint: Cellulitis swelling pain right foot  HPI: Shane [name] is a 45 y.o. male who presents with diabetic insensate neuropathy status post foot salvage intervention on the left with toe amputations who presents with cellulitis swelling and pain of the right foot. Patient denies any acute trauma. States that his sugars were running in the 300s at home and now they're in the 200s in the hospital.  Past Medical History:  Diagnosis Date  . Anemia   . Anxiety   . Bipolar disorder ([place])   . Chest pain    a. 2015 Reportedly normal stress test in [place].  Marland Kitchen Chronic diastolic CHF (congestive heart failure) (HCC)    a.03/2015 Echo: EF 55-60%, Gr 1 DD, mild MR, triv PR.  . Depression   . GERD (gastroesophageal reflux disease)   . Hyperlipidemia   . Hypertension    a. 08/2014 Admitted with hypertensive urgency.  . Refusal of blood transfusions as patient is Jehovah's Witness   . Sleep apnea   . TIA (transient ischemic attack) 08/2014; 03/2015   a. 08/2014 in setting of hypertensive urgency.  . Type II diabetes mellitus ([place])    Past Surgical History:  Procedure Laterality Date  . AMPUTATION Left 08/03/2015   Procedure: AMPUTATION LEFT GREAT TOE;  Surgeon: Newt Minion, MD;  Location: [facility];  Service: Orthopedics;  Laterality: Left;  . AMPUTATION Left 12/02/2015   Procedure: amputation of left 2nd digit  . APPLICATION OF WOUND VAC Left 12/19/2015   Procedure: APPLICATION OF WOUND VAC;  Surgeon: Meredith Pel, MD;  Location: [facility];  Service: Orthopedics;  Laterality: Left;  . CIRCUMCISION    . I&D EXTREMITY Left 12/02/2015   Procedure: IRRIGATION AND DEBRIDEMENT OF FOOT; LEFT SECOND TOE AMPUTATION;  Surgeon: Meredith Pel, MD;  Location: [facility];  Service: Orthopedics;  Laterality: Left;  . I&D EXTREMITY Left 12/19/2015   Procedure: I & D LEFT FOOT WITH BEADS ;  Surgeon: Meredith Pel, MD;  Location: [facility];  Service: Orthopedics;  Laterality: Left;  . INGUINAL HERNIA REPAIR Bilateral ~ 1983- ~ 1986  . TONSILLECTOMY  ~ 64   Social History   Social History  . Marital status: Married    Spouse name: N/A  . Number of children: N/A  . Years of education: N/A   Social History Main Topics  . Smoking status: Never Smoker  . Smokeless tobacco: Never Used  . Alcohol use No  . Drug use: No  . Sexual activity: No   Other Topics Concern  . None   Social History Narrative   Lives in [place] with wife.  Active but doesn't routinely exercise.   Family History  Problem Relation Age of Onset  . Hypertension Mother   . Diabetes Mother   . Hyperlipidemia Mother   . Heart disease Mother     s/p pacemaker  . Diabetes Father   . Hypertension Father   . Stroke Father   . Heart attack Father     first MI @ 62.  . Stroke Brother    - negative except otherwise stated in the family history section Allergies  Allergen Reactions  . Other Other (See Comments)    Red meat causes stomach pains, bloating and diarrhea  . Milk-Related Compounds Diarrhea and Other (See Comments)    Any dairy products  - diarrhea and bloating   Prior to Admission medications   Medication Sig  Start Date End Date Taking? Authorizing Provider  aspirin EC 325 MG tablet Take 1 tablet (325 mg total) by mouth daily. 09/04/15  Yes Maren Reamer, MD  atorvastatin (LIPITOR) 80 MG tablet Take 1 tablet (80 mg total) by mouth every morning. 08/18/15  Yes Josalyn Funches, MD  carvedilol (COREG) 12.5 MG tablet TAKE 1 TABLET BY MOUTH 2 TIMES DAILY WITH A MEAL Patient taking differently: TAKE 1 TABLET BY MOUTH IN THE MORNING. 05/17/15  Yes Josalyn Funches, MD  divalproex (DEPAKOTE) 500 MG DR tablet Take 2 tablets (1,000 mg total) by mouth at bedtime. 12/04/15  Yes Venetia Maxon Rama, MD  ferrous sulfate 325 (65 FE) MG tablet Take 1 tablet (325 mg total) by mouth 2 (two) times daily with a meal. Patient taking differently: Take 325 mg by  mouth daily with breakfast.  08/18/15  Yes Josalyn Funches, MD  FLUoxetine (PROZAC) 10 MG tablet Take 1 tablet (10 mg total) by mouth every morning. 01/02/16  Yes Oswald Hillock, MD  gabapentin (NEURONTIN) 100 MG capsule Take 1 capsule (100 mg total) by mouth 3 (three) times daily. 12/23/15  Yes Ripudeep Krystal Eaton, MD  glipiZIDE (GLUCOTROL) 10 MG tablet Take 1 tablet (10 mg total) by mouth 2 (two) times daily before a meal. Patient taking differently: Take 10 mg by mouth daily before breakfast.  08/18/15  Yes Josalyn Funches, MD  Insulin Glargine (LANTUS SOLOSTAR) 100 UNIT/ML Solostar Pen Inject 10 Units into the skin daily at 10 pm. 12/28/15  Yes Josalyn Funches, MD  lisinopril (PRINIVIL,ZESTRIL) 40 MG tablet Take 1 tablet (40 mg total) by mouth daily. 08/18/15  Yes Boykin Nearing, MD  metFORMIN (GLUCOPHAGE) 1000 MG tablet Take 1 tablet (1,000 mg total) by mouth 2 (two) times daily with a meal. 12/22/15  Yes Ripudeep K Rai, MD  omeprazole (PRILOSEC) 20 MG capsule Take 1 capsule (20 mg total) by mouth daily. 08/18/15  Yes Josalyn Funches, MD  traZODone (DESYREL) 50 MG tablet Take 0.5-1 tablets (25-50 mg total) by mouth at bedtime as needed for sleep. 11/21/15  Yes Josalyn Funches, MD  Blood Glucose Monitoring Suppl (TRUE METRIX METER) w/Device KIT Use as directed 12/29/15   Boykin Nearing, MD  glucose blood (TRUE METRIX BLOOD GLUCOSE TEST) test strip Use as instructed 12/29/15   Josalyn Funches, MD  Insulin Pen Needle (B-D ULTRAFINE III SHORT PEN) 31G X 8 MM MISC 1 application by Does not apply route daily. 12/28/15   Boykin Nearing, MD  TRUEPLUS LANCETS 28G MISC Use as directed 12/29/15   Boykin Nearing, MD   Dg Foot Complete Left  Result Date: 01/12/2016 CLINICAL DATA:  45 year old male with open diabetic ulcer on top of the left foot EXAM: LEFT FOOT - COMPLETE 3+ VIEW COMPARISON:  Foot MRI dated 12/31/2015 and radiograph dated 12/30/2015 FINDINGS: There is amputation of the first metatarsophalangeal joint as well as  amputation of the distal aspect of the proximal phalanx of the second toe. There is soft tissue thickening over the amputation site without soft tissue gas. There is no periosteal reaction or bony erosion to suggest osteomyelitis. No acute fracture or dislocation identified. There is again diffuse soft tissue swelling of the foot primarily over the dorsum of the foot. There is a skin wound in the dorsum of the forefoot as seen on the prior study. No radiopaque foreign object identified. IMPRESSION: Diffuse soft tissue swelling of the foot with a wound in the dorsal aspect of the forefoot as seen on the prior radiograph.  No interval change since the prior radiograph. Electronically Signed   By: Anner Crete M.D.   On: 01/12/2016 05:31  Dg Foot Complete Right  Result Date: 01/11/2016 CLINICAL DATA:  45 year old male with right foot pain and swelling. EXAM: RIGHT FOOT COMPLETE - 3+ VIEW COMPARISON:  Radiograph dated 09/01/2015 the FINDINGS: There is no acute fracture or dislocation. The bones are well mineralized. No arthritic changes. There is diffuse soft tissue swelling of the foot. No radiopaque foreign object or soft tissue gas identified. IMPRESSION: No acute osseous pathology. Diffuse soft tissue swelling of the foot. Electronically Signed   By: Anner Crete M.D.   On: 01/11/2016 23:59  - pertinent xrays, CT, MRI studies were reviewed and independently interpreted  Positive ROS: All other systems have been reviewed and were otherwise negative with the exception of those mentioned in the HPI and as above.  Physical Exam: General: Alert, no acute distress Cardiovascular: Edema right foot Respiratory: No cyanosis, no use of accessory musculature GI: No organomegaly, abdomen is soft and non-tender Skin: Cellulitis right foot and ankle with blistering dorsally on the foot. Neurologic: Patient does not have protective sensation. Psychiatric: Patient is competent for consent with normal mood  and affect Lymphatic: No axillary or cervical lymphadenopathy  MUSCULOSKELETAL:  On examination patient does have a palpable dorsalis pedis pulse he has a fluctuant swelling of the right foot dorsally with cellulitis just above the ankle. There is no induration of the skin. I cannot localize an abscess there is no open wounds. No signs of any penetrating trauma. After informed consent an 18-gauge needle was inserted into the foot after sterile prepping over the area of fluctuance to see if patient had an abscess. Clear serous sanguinous fluid was drained from the foot with some massage no further fluid other than clear serosanguineous fluid could be expressed from the foot. There is no clinical signs of an abscess. Radiographs shows no free air.  Assessment: Assessment: Diabetic insensate neuropathy with cellulitis of the right foot.  Plan: Plan: Would continue IV antibiotics. I will reevaluate the patient on Monday. If he is still symptomatic on Monday would recommend ordering an MRI scan on Monday to evaluate for osteomyelitis or abscess right foot  Thank you for the consult and the opportunity to see Mr. Benjaman Pott, MD [facility] [phone] 5:01 PM

## 2016-01-12 NOTE — Progress Notes (Signed)
Initial Nutrition Assessment  DOCUMENTATION CODES:   Not applicable  INTERVENTION:  Provide Glucerna Shake po BID, each supplement provides 220 kcal and 10 grams of protein.  Encourage adequate PO intake.   Recommend obtaining new weight to fully assess weight trends.   NUTRITION DIAGNOSIS:   Increased nutrient needs related to wound healing as evidenced by estimated needs.  GOAL:   Patient will meet greater than or equal to 90% of their needs  MONITOR:   PO intake, Supplement acceptance, Labs, Weight trends, Skin, I & O's  REASON FOR ASSESSMENT:   Consult Wound healing  ASSESSMENT:   45 y.o. male with medical history significant of DM2.  Patient recently hospitalized at the start of this month for cellulitis and abscess of the left foot, has open wound that has been being treated with wound vac to dorsal surface. He does report that recently he has been noticing more drainage from the LEFT wound site; however, what really brings him in is that he now has erythema, edema, to the RIGHT foot and ankle on the dorsal surface (contralateral foot)  Pt familiar to this RD from previous hospital admission. Pt reports having a good appetite currently and PTA with usual consumption of at least 3 meals a day with no other difficulties. No percent meal completion recorded, however pt reports eating well at meals. Noted no new weight recorded. Pt reports weight has been stable at ~180 lbs. Pt is agreeable to nutritional supplements to aid in wound healing. RD to order.   Pt with no observed significant fat or muscle mass loss.   Labs and medications reviewed.   Diet Order:  Diet Carb Modified Fluid consistency: Thin; Room service appropriate? Yes  Skin:  Wound (see comment) (L foot wound, R foot ankle cellulitis)  Last BM:  Unknown  Height:   Ht Readings from Last 1 Encounters:  12/30/15 5\' 8"  (1.727 m)    Weight:   Wt Readings from Last 1 Encounters:  12/30/15 158 lb 15.9  oz (72.1 kg)    Ideal Body Weight:  70 kg  BMI:  There is no height or weight on file to calculate BMI.  Estimated Nutritional Needs:   Kcal:  2000-2300  Protein:  100-110 grams  Fluid:  2- 2.3 L/day  EDUCATION NEEDS:   No education needs identified at this time  Corrin Parker, MS, RD, LDN Pager # [phone] After hours/ weekend pager # [phone]

## 2016-01-12 NOTE — Progress Notes (Signed)
Inpatient Diabetes Program Recommendations  AACE/ADA: New Consensus Statement on Inpatient Glycemic Control (2015)  Target Ranges:  Prepandial:   less than 140 mg/dL      Peak postprandial:   less than 180 mg/dL (1-2 hours)      Critically ill patients:  140 - 180 mg/dL   Results for Shane [name], Shane [name] (MRN [id number]) as of 01/12/2016 12:04  Ref. Range 01/12/2016 07:06  Glucose-Capillary Latest Ref Range: 65 - 99 mg/dL 271 (H)   Results for Shane [name], Shane [name] (MRN [id number]) as of 01/12/2016 12:04  Ref. Range 01/01/2016 02:33  Hemoglobin A1C Latest Ref Range: 4.8 - 5.6 % 8.0 (H)    Admit with: Cellulitis R Ankle  History: DM, CHF  Home DM Meds: Glipizide 10 mg daily       Metformin 1000 mg bid       Lantus 10 units QHS  Current Insulin Orders: Lantus 10 units QHS      Novolog Moderate Correction Scale/ SSI (0-15 units) TID AC    -Through Chart Review: Patient saw PCP at [facility] on 12/28/15.  Lantus 10 units daily was started at that PCP visit.  No insulin at home prior to 12/28/15.  -Patient hospitalized 07/15-07/18 for Foot ulcer.  -Note Lantus and Novolog started today.   --Will follow patient during hospitalization--  Wyn Quaker RN, MSN, CDE Diabetes Coordinator Inpatient Glycemic Control Team Team Pager: [phone] (8a-5p)

## 2016-01-13 LAB — CBC
HCT: 24.5 % — ABNORMAL LOW (ref 39.0–52.0)
HEMATOCRIT: 28.2 % — AB (ref 39.0–52.0)
HEMOGLOBIN: 7.7 g/dL — AB (ref 13.0–17.0)
HEMOGLOBIN: 8.9 g/dL — AB (ref 13.0–17.0)
MCH: 29.6 pg (ref 26.0–34.0)
MCH: 30 pg (ref 26.0–34.0)
MCHC: 31.4 g/dL (ref 30.0–36.0)
MCHC: 31.6 g/dL (ref 30.0–36.0)
MCV: 94.2 fL (ref 78.0–100.0)
MCV: 94.9 fL (ref 78.0–100.0)
Platelets: 256 10*3/uL (ref 150–400)
Platelets: 293 10*3/uL (ref 150–400)
RBC: 2.6 MIL/uL — AB (ref 4.22–5.81)
RBC: 2.97 MIL/uL — ABNORMAL LOW (ref 4.22–5.81)
RDW: 13.1 % (ref 11.5–15.5)
RDW: 13 % (ref 11.5–15.5)
WBC: 7.3 10*3/uL (ref 4.0–10.5)
WBC: 9.9 10*3/uL (ref 4.0–10.5)

## 2016-01-13 LAB — GLUCOSE, CAPILLARY
GLUCOSE-CAPILLARY: 141 mg/dL — AB (ref 65–99)
Glucose-Capillary: 196 mg/dL — ABNORMAL HIGH (ref 65–99)
Glucose-Capillary: 307 mg/dL — ABNORMAL HIGH (ref 65–99)
Glucose-Capillary: 230 mg/dL — ABNORMAL HIGH (ref 65–99)

## 2016-01-13 LAB — URINE CULTURE: Culture: NO GROWTH

## 2016-01-13 MED ORDER — CYANOCOBALAMIN 1000 MCG/ML IJ SOLN
1000.0000 ug | Freq: Once | INTRAMUSCULAR | Status: AC
  Administered 2016-01-13: 1000 ug via INTRAMUSCULAR
  Filled 2016-01-13: qty 1

## 2016-01-13 MED ORDER — ADULT MULTIVITAMIN W/MINERALS CH
1.0000 | ORAL_TABLET | Freq: Every day | ORAL | Status: DC
  Administered 2016-01-13 – 2016-01-23 (×8): 1 via ORAL
  Filled 2016-01-13 (×8): qty 1

## 2016-01-13 MED ORDER — INSULIN GLARGINE 100 UNIT/ML ~~LOC~~ SOLN
15.0000 [IU] | Freq: Every day | SUBCUTANEOUS | Status: DC
  Administered 2016-01-13: 15 [IU] via SUBCUTANEOUS
  Filled 2016-01-13 (×2): qty 0.15

## 2016-01-13 MED ORDER — INSULIN ASPART 100 UNIT/ML ~~LOC~~ SOLN
3.0000 [IU] | Freq: Three times a day (TID) | SUBCUTANEOUS | Status: DC
  Administered 2016-01-13 – 2016-01-15 (×6): 3 [IU] via SUBCUTANEOUS

## 2016-01-13 MED ORDER — SODIUM CHLORIDE 0.9 % IV SOLN
510.0000 mg | Freq: Once | INTRAVENOUS | Status: AC
  Administered 2016-01-13: 510 mg via INTRAVENOUS
  Filled 2016-01-13: qty 17

## 2016-01-13 NOTE — Progress Notes (Signed)
PROGRESS NOTE  Shane [name]  [id number] DOB: [birth year] DOA: 01/11/2016 PCP: Minerva Ends, MD  Brief Narrative:   Shane [name] is a 45 y.o. male with medical history significant of DM2.  Patient recently hospitalized at the start of this month for cellulitis and abscess of the left foot and had an open wound that was treated with wound vac to dorsal surface.  Seeing Dr. Marlou Sa and Dr. Sharol Given.  He was discharged on Doxycyline.  He reports increased ulceration of the distal portion of the ulcer which previously was flush with the surface of his skin, but no increased erythema, induration, odor, or discharge.  He presented due to increased erythema of the right lower extremity which he had injured on his wheelchair.    Assessment & Plan:   Principal Problem:   Cellulitis of right ankle Active Problems:   Essential hypertension   Diabetic foot infection (HCC)   Cellulitis and abscess, left foot   Type 2 diabetes mellitus with vascular disease (HCC)   Cellulitis of left ankle  Cellulitis of the right ankle, line drawn to mark area of the erythema.  Source likely the traumatic wound to the dorsum of his foot.  Most likely this reflects strep infection which is not as well treated by doxycycline -  Continue ancef  -  Started florastor -  Continue hydrocodone for pain -  Bedside drainage of serosanguinous fluid by Dr. Sharol Given on 7/28 -  Increased fluctuance to dorsum of foot today with blistering areas -  Continue IV abx for additional 24 hours and will obtain MRI later on Sunday so results are available for orthopedics to review on Monday for possible I&D  Left foot ulcer appears stable.  -  Dr. Sharol Given notified -  X-ray negative for bony involvement or subcutaneous air -  Continue vancomycin for now as may need surgical debridement of the right foot  Hypertension, blood pressures stable -  Continue carvedilol and lisinopril  Diabetes mellitus type 2, CBG higher than usual over the  last few days.  Diabetic neuropathy -  Hold metformin and glipizide -  Increase lantus to 15 units -  Add standing aspart 3 units with meals -  Continue SSI -  Continue gabapentin  GERD, stable, continue PPI  Depression/anxiety, stable, continue fluoxetine  Urinary frequency, may be due to elevated CBG causing osmotic diuresis.  UA negative for infection but demonstrates copious glucosuria.    DVT prophylaxis:  lovenox Code Status:  full Family Communication:  Patient alone Disposition Plan:   Continue IV antibiotics and except inpatient through at least Monday and suspect he will need a surgical debridement by orthopedics   Consultants:   Dr. Sharol Given, Orthopedics  Procedures:  none  Antimicrobials:   Vancomycin 7/28 >   Ceftriaxone and flagyl 7/28  Ancef 7/28 >     Subjective:  Erythema of the right foot is improving, but the swelling on the dorsum of the foot has worsened.    Objective: Vitals:   01/12/16 0520 01/12/16 1400 01/12/16 1937 01/13/16 0546  BP: 138/79 109/61 (!) 107/59 127/77  Pulse: 85 92 92 87  Resp: 16 18 18 18   Temp: 98 F (36.7 C) 98.4 F (36.9 C) 98.5 F (36.9 C) 97.5 F (36.4 C)  TempSrc: Oral  Oral Oral  SpO2: 100% 95% 99% 93%    Intake/Output Summary (Last 24 hours) at 01/13/16 1253 Last data filed at 01/13/16 0900  Gross per 24 hour  Intake  480 ml  Output              300 ml  Net              180 ml   There were no vitals filed for this visit.  Examination:  General exam:  Adult male.  No acute distress.  HEENT:  NCAT, MMM Respiratory system: Clear to auscultation bilaterally Cardiovascular system: Regular rate and rhythm, normal S1/S2. No murmurs, rubs, gallops or clicks.  Warm extremities Gastrointestinal system: Normal active bowel sounds, soft, nondistended, nontender. MSK:  Pink erythema of the right foot is receding.  Fluctuance of the dorsum of the right foot.  Two areas where needle was inserted by Dr.  Sharol Given yesterday oozing serosanguinous fluid.   Neuro:  Grossly intact    Data Reviewed: I have personally reviewed following labs and imaging studies  CBC:  Recent Labs Lab 01/12/16 0110 01/13/16 0252 01/13/16 1156  WBC 9.1 7.3 9.9  NEUTROABS 6.2  --   --   HGB 9.3* 7.7* 8.9*  HCT 28.3* 24.5* 28.2*  MCV 93.4 94.2 94.9  PLT 273 256 0000000   Basic Metabolic Panel:  Recent Labs Lab 01/12/16 0110  NA 139  K 4.9  CL 105  CO2 26  GLUCOSE 288*  BUN 16  CREATININE 1.12  CALCIUM 9.2   GFR: Estimated Creatinine Clearance: 79.7 mL/min (by C-G formula based on SCr of 1.12 mg/dL). Liver Function Tests: No results for input(s): AST, ALT, ALKPHOS, BILITOT, PROT, ALBUMIN in the last 168 hours. No results for input(s): LIPASE, AMYLASE in the last 168 hours. No results for input(s): AMMONIA in the last 168 hours. Coagulation Profile: No results for input(s): INR, PROTIME in the last 168 hours. Cardiac Enzymes: No results for input(s): CKTOTAL, CKMB, CKMBINDEX, TROPONINI in the last 168 hours. BNP (last 3 results) No results for input(s): PROBNP in the last 8760 hours. HbA1C: No results for input(s): HGBA1C in the last 72 hours. CBG:  Recent Labs Lab 01/12/16 1150 01/12/16 1747 01/12/16 2118 01/13/16 0609 01/13/16 1134  GLUCAP 244* 275* 239* 196* 307*   Lipid Profile: No results for input(s): CHOL, HDL, LDLCALC, TRIG, CHOLHDL, LDLDIRECT in the last 72 hours. Thyroid Function Tests: No results for input(s): TSH, T4TOTAL, FREET4, T3FREE, THYROIDAB in the last 72 hours. Anemia Panel: No results for input(s): VITAMINB12, FOLATE, FERRITIN, TIBC, IRON, RETICCTPCT in the last 72 hours. Urine analysis:    Component Value Date/Time   COLORURINE YELLOW 01/12/2016 1756   APPEARANCEUR CLEAR 01/12/2016 1756   LABSPEC 1.022 01/12/2016 1756   PHURINE 6.0 01/12/2016 1756   GLUCOSEU >1000 (A) 01/12/2016 1756   HGBUR NEGATIVE 01/12/2016 1756   BILIRUBINUR NEGATIVE 01/12/2016 1756    KETONESUR NEGATIVE 01/12/2016 1756   PROTEINUR 30 (A) 01/12/2016 1756   UROBILINOGEN 0.2 04/24/2015 1056   NITRITE NEGATIVE 01/12/2016 1756   LEUKOCYTESUR NEGATIVE 01/12/2016 1756   Sepsis Labs: @LABRCNTIP (procalcitonin:4,lacticidven:4)  ) Recent Results (from the past 240 hour(s))  Culture, blood (Routine X 2) w Reflex to ID Panel     Status: None (Preliminary result)   Collection Time: 01/12/16  2:45 AM  Result Value Ref Range Status   Specimen Description BLOOD RIGHT ARM  Final   Special Requests BOTTLES DRAWN AEROBIC AND ANAEROBIC 10ML  Final   Culture NO GROWTH 1 DAY  Final   Report Status PENDING  Incomplete  Culture, blood (Routine X 2) w Reflex to ID Panel     Status: None (Preliminary result)  Collection Time: 01/12/16  2:55 AM  Result Value Ref Range Status   Specimen Description BLOOD RIGHT ARM  Final   Special Requests IN PEDIATRIC BOTTLE 4ML  Final   Culture NO GROWTH 1 DAY  Final   Report Status PENDING  Incomplete      Radiology Studies: Dg Foot Complete Left  Result Date: 01/12/2016 CLINICAL DATA:  45 year old male with open diabetic ulcer on top of the left foot EXAM: LEFT FOOT - COMPLETE 3+ VIEW COMPARISON:  Foot MRI dated 12/31/2015 and radiograph dated 12/30/2015 FINDINGS: There is amputation of the first metatarsophalangeal joint as well as amputation of the distal aspect of the proximal phalanx of the second toe. There is soft tissue thickening over the amputation site without soft tissue gas. There is no periosteal reaction or bony erosion to suggest osteomyelitis. No acute fracture or dislocation identified. There is again diffuse soft tissue swelling of the foot primarily over the dorsum of the foot. There is a skin wound in the dorsum of the forefoot as seen on the prior study. No radiopaque foreign object identified. IMPRESSION: Diffuse soft tissue swelling of the foot with a wound in the dorsal aspect of the forefoot as seen on the prior radiograph. No  interval change since the prior radiograph. Electronically Signed   By: Anner Crete M.D.   On: 01/12/2016 05:31  Dg Foot Complete Right  Result Date: 01/11/2016 CLINICAL DATA:  45 year old male with right foot pain and swelling. EXAM: RIGHT FOOT COMPLETE - 3+ VIEW COMPARISON:  Radiograph dated 09/01/2015 the FINDINGS: There is no acute fracture or dislocation. The bones are well mineralized. No arthritic changes. There is diffuse soft tissue swelling of the foot. No radiopaque foreign object or soft tissue gas identified. IMPRESSION: No acute osseous pathology. Diffuse soft tissue swelling of the foot. Electronically Signed   By: Anner Crete M.D.   On: 01/11/2016 23:59    Scheduled Meds: . aspirin EC  325 mg Oral Daily  . atorvastatin  80 mg Oral q1800  . carvedilol  12.5 mg Oral Q breakfast  .  ceFAZolin (ANCEF) IV  1 g Intravenous Q8H  . divalproex  1,000 mg Oral QHS  . enoxaparin (LOVENOX) injection  40 mg Subcutaneous Q24H  . feeding supplement (GLUCERNA SHAKE)  237 mL Oral BID BM  . ferrous sulfate  325 mg Oral Q breakfast  . FLUoxetine  10 mg Oral Daily  . gabapentin  100 mg Oral TID  . insulin aspart  0-15 Units Subcutaneous TID WC  . insulin glargine  10 Units Subcutaneous Q2200  . lisinopril  40 mg Oral Daily  . multivitamin with minerals  1 tablet Oral Daily  . pantoprazole  40 mg Oral Daily  . saccharomyces boulardii  250 mg Oral BID  . vancomycin  750 mg Intravenous Q8H   Continuous Infusions:    LOS: 1 day    Time spent: 30 min    Janece Canterbury, MD [facility] Pager [phone]  If 7PM-7AM, please contact night-coverage [email or web address] Password [facility] 01/13/2016, 12:53 PM

## 2016-01-14 ENCOUNTER — Inpatient Hospital Stay (HOSPITAL_COMMUNITY): Payer: Medicaid Other

## 2016-01-14 LAB — GLUCOSE, CAPILLARY
GLUCOSE-CAPILLARY: 157 mg/dL — AB (ref 65–99)
Glucose-Capillary: 220 mg/dL — ABNORMAL HIGH (ref 65–99)
Glucose-Capillary: 179 mg/dL — ABNORMAL HIGH (ref 65–99)
Glucose-Capillary: 198 mg/dL — ABNORMAL HIGH (ref 65–99)

## 2016-01-14 LAB — VANCOMYCIN, TROUGH: Vancomycin Tr: 16 ug/mL (ref 15–20)

## 2016-01-14 MED ORDER — GADOBENATE DIMEGLUMINE 529 MG/ML IV SOLN
17.0000 mL | Freq: Once | INTRAVENOUS | Status: AC | PRN
  Administered 2016-01-14: 17 mL via INTRAVENOUS

## 2016-01-14 MED ORDER — SENNA 8.6 MG PO TABS
2.0000 | ORAL_TABLET | Freq: Every day | ORAL | Status: DC
  Administered 2016-01-14 – 2016-01-21 (×7): 17.2 mg via ORAL
  Filled 2016-01-14 (×9): qty 2

## 2016-01-14 MED ORDER — INSULIN GLARGINE 100 UNIT/ML ~~LOC~~ SOLN
20.0000 [IU] | Freq: Every day | SUBCUTANEOUS | Status: DC
  Administered 2016-01-14: 20 [IU] via SUBCUTANEOUS
  Filled 2016-01-14 (×2): qty 0.2

## 2016-01-14 MED ORDER — POLYETHYLENE GLYCOL 3350 17 G PO PACK
17.0000 g | PACK | Freq: Every day | ORAL | Status: DC
  Administered 2016-01-16: 17 g via ORAL
  Filled 2016-01-14 (×3): qty 1

## 2016-01-14 NOTE — Progress Notes (Signed)
Pharmacy Antibiotic Note Shane [name] is a 46 y.o. male admitted on 01/11/2016 with diabetic infection w/ high risk of MRSA. Currently on day 4 of Ancef and vancomycin.  VT this evening is 16, drawn correctly on dose of vancomycin 750 mg every 8 hours. SCr 1.12    Plan: 1. Continue Vancomycin 750 mg every 8 hours for now 2. Weekly vancomycin trough unless clinical course dictates otherwise 3. BMP for 7/31      Temp (24hrs), Avg:98.7 F (37.1 C), Min:98.3 F (36.8 C), Max:99.5 F (37.5 C)   Recent Labs Lab 01/12/16 0110 01/12/16 0256 01/13/16 0252 01/13/16 1156 01/14/16 1957  WBC 9.1  --  7.3 9.9  --   CREATININE 1.12  --   --   --   --   LATICACIDVEN  --  1.14  --   --   --   VANCOTROUGH  --   --   --   --  16    CrCl cannot be calculated (Unknown ideal weight.).    Allergies  Allergen Reactions  . Other Other (See Comments)    Red meat causes stomach pains, bloating and diarrhea  . Milk-Related Compounds Diarrhea and Other (See Comments)    Any dairy products  - diarrhea and bloating   Antibiotics this admission:  Zosyn 7/28 x1 CTX 7/28 x1 Flagyl 7/2 x2  Vanc 7/28>>   Ancef 7/28>>  Pertinent culture data:  7/28 BCx: ngtd 7/28 UCx: ngF  Vincenza Hews, PharmD, BCPS 01/14/2016, 9:06 PM Pager: [phone]

## 2016-01-14 NOTE — Progress Notes (Signed)
PROGRESS NOTE  Shane [name]  [id number] DOB: Aug 10, 1970 DOA: 01/11/2016 PCP: Minerva Ends, MD  Brief Narrative:   Shane [name] is a 45 y.o. male with medical history significant of DM2.  Patient recently hospitalized at the start of this month for cellulitis and abscess of the left foot and had an open wound that was treated with wound vac to dorsal surface.  Seeing Dr. Marlou Sa and Dr. Sharol Given.  He was discharged on Doxycyline.  He reports increased ulceration of the distal portion of the ulcer which previously was flush with the surface of his skin, but no increased erythema, induration, odor, or discharge.  He presented due to increased erythema of the right lower extremity which he had injured on his wheelchair.    Assessment & Plan:   Principal Problem:   Cellulitis of right ankle Active Problems:   Essential hypertension   Diabetic foot infection (HCC)   Cellulitis and abscess, left foot   Type 2 diabetes mellitus with vascular disease (HCC)   Cellulitis of left ankle  Cellulitis of the right ankle.  Source likely the traumatic wound to the dorsum of his foot.  Most likely this reflects strep infection which is not as well treated by doxycycline.  Erythema resolving, but area of fluctuance developing on dorsum of foot -  Continue ancef  -  Started florastor -  Continue hydrocodone for pain -  Bedside drainage of serosanguinous fluid by Dr. Sharol Given on 7/28 -  MRI today -  Appreciate orthopedics assistance  Left foot ulcer appears stable.  -  X-ray negative for bony involvement or subcutaneous air -  Continue vancomycin for now as may need surgical debridement of the right foot  Hypertension, blood pressures stable -  Continue carvedilol and lisinopril  Diabetes mellitus type 2, CBG higher than usual over the last few days.  Diabetic neuropathy -  Hold metformin and glipizide -  Increase lantus to 20 units -  Add standing aspart 3 units with meals -  Continue SSI -   Continue gabapentin  GERD, stable, continue PPI  Depression/anxiety, stable, continue fluoxetine  Urinary frequency, may be due to elevated CBG causing osmotic diuresis.  UA negative for infection but demonstrates copious glucosuria.    DVT prophylaxis:  lovenox Code Status:  full Family Communication:  Patient alone Disposition Plan:   Continue IV antibiotics and except inpatient through at least Monday and suspect he will need a surgical debridement by orthopedics   Consultants:   Dr. Sharol Given, Orthopedics  Procedures:  none  Antimicrobials:   Vancomycin 7/28 >   Ceftriaxone and flagyl 7/28  Ancef 7/28 >     Subjective:  Erythema of the right foot is improving, but the swelling on the dorsum of the foot has worsened.    Objective: Vitals:   01/13/16 0546 01/13/16 1300 01/13/16 1946 01/14/16 0354  BP: 127/77 130/70 136/79 131/65  Pulse: 87 88 (!) 101 80  Resp: 18 18 18 16   Temp: 97.5 F (36.4 C) 98.4 F (36.9 C) 98.2 F (36.8 C) 98.4 F (36.9 C)  TempSrc: Oral Oral Oral Oral  SpO2: 93% 93% 99% 95%    Intake/Output Summary (Last 24 hours) at 01/14/16 1218 Last data filed at 01/14/16 0842  Gross per 24 hour  Intake             2040 ml  Output             2650 ml  Net             -  610 ml   There were no vitals filed for this visit.  Examination:  General exam:  Adult male.  No acute distress.  HEENT:  NCAT, MMM Respiratory system: Clear to auscultation bilaterally Cardiovascular system: Regular rate and rhythm, normal S1/S2. No murmurs, rubs, gallops or clicks.  Warm extremities Gastrointestinal system: Normal active bowel sounds, soft, nondistended, nontender. MSK:  Pink erythema of the right foot is receding.  Increasing fluctuance of the dorsum of the right foot.  Two areas where needle was inserted by Dr. Sharol Given still oozing serosanguinous fluid.  Top of foot is denuded.   Neuro:  Grossly intact    Data Reviewed: I have personally reviewed following  labs and imaging studies  CBC:  Recent Labs Lab 01/12/16 0110 01/13/16 0252 01/13/16 1156  WBC 9.1 7.3 9.9  NEUTROABS 6.2  --   --   HGB 9.3* 7.7* 8.9*  HCT 28.3* 24.5* 28.2*  MCV 93.4 94.2 94.9  PLT 273 256 0000000   Basic Metabolic Panel:  Recent Labs Lab 01/12/16 0110  NA 139  K 4.9  CL 105  CO2 26  GLUCOSE 288*  BUN 16  CREATININE 1.12  CALCIUM 9.2   GFR: CrCl cannot be calculated (Unknown ideal weight.). Liver Function Tests: No results for input(s): AST, ALT, ALKPHOS, BILITOT, PROT, ALBUMIN in the last 168 hours. No results for input(s): LIPASE, AMYLASE in the last 168 hours. No results for input(s): AMMONIA in the last 168 hours. Coagulation Profile: No results for input(s): INR, PROTIME in the last 168 hours. Cardiac Enzymes: No results for input(s): CKTOTAL, CKMB, CKMBINDEX, TROPONINI in the last 168 hours. BNP (last 3 results) No results for input(s): PROBNP in the last 8760 hours. HbA1C: No results for input(s): HGBA1C in the last 72 hours. CBG:  Recent Labs Lab 01/13/16 1134 01/13/16 1642 01/13/16 2103 01/14/16 0607 01/14/16 1115  GLUCAP 307* 141* 230* 220* 179*   Lipid Profile: No results for input(s): CHOL, HDL, LDLCALC, TRIG, CHOLHDL, LDLDIRECT in the last 72 hours. Thyroid Function Tests: No results for input(s): TSH, T4TOTAL, FREET4, T3FREE, THYROIDAB in the last 72 hours. Anemia Panel: No results for input(s): VITAMINB12, FOLATE, FERRITIN, TIBC, IRON, RETICCTPCT in the last 72 hours. Urine analysis:    Component Value Date/Time   COLORURINE YELLOW 01/12/2016 1756   APPEARANCEUR CLEAR 01/12/2016 1756   LABSPEC 1.022 01/12/2016 1756   PHURINE 6.0 01/12/2016 1756   GLUCOSEU >1000 (A) 01/12/2016 1756   HGBUR NEGATIVE 01/12/2016 1756   BILIRUBINUR NEGATIVE 01/12/2016 1756   KETONESUR NEGATIVE 01/12/2016 1756   PROTEINUR 30 (A) 01/12/2016 1756   UROBILINOGEN 0.2 04/24/2015 1056   NITRITE NEGATIVE 01/12/2016 1756   LEUKOCYTESUR  NEGATIVE 01/12/2016 1756   Sepsis Labs: @LABRCNTIP (procalcitonin:4,lacticidven:4)  ) Recent Results (from the past 240 hour(s))  Culture, blood (Routine X 2) w Reflex to ID Panel     Status: None (Preliminary result)   Collection Time: 01/12/16  2:45 AM  Result Value Ref Range Status   Specimen Description BLOOD RIGHT ARM  Final   Special Requests BOTTLES DRAWN AEROBIC AND ANAEROBIC 10ML  Final   Culture NO GROWTH 2 DAYS  Final   Report Status PENDING  Incomplete  Culture, blood (Routine X 2) w Reflex to ID Panel     Status: None (Preliminary result)   Collection Time: 01/12/16  2:55 AM  Result Value Ref Range Status   Specimen Description BLOOD RIGHT ARM  Final   Special Requests IN PEDIATRIC BOTTLE 4ML  Final  Culture NO GROWTH 2 DAYS  Final   Report Status PENDING  Incomplete  Culture, Urine     Status: None   Collection Time: 01/12/16  5:56 PM  Result Value Ref Range Status   Specimen Description URINE, RANDOM  Final   Special Requests NONE  Final   Culture NO GROWTH  Final   Report Status 01/13/2016 FINAL  Final      Radiology Studies: No results found.   Scheduled Meds: . aspirin EC  325 mg Oral Daily  . atorvastatin  80 mg Oral q1800  . carvedilol  12.5 mg Oral Q breakfast  .  ceFAZolin (ANCEF) IV  1 g Intravenous Q8H  . divalproex  1,000 mg Oral QHS  . enoxaparin (LOVENOX) injection  40 mg Subcutaneous Q24H  . feeding supplement (GLUCERNA SHAKE)  237 mL Oral BID BM  . ferrous sulfate  325 mg Oral Q breakfast  . FLUoxetine  10 mg Oral Daily  . gabapentin  100 mg Oral TID  . insulin aspart  0-15 Units Subcutaneous TID WC  . insulin aspart  3 Units Subcutaneous TID WC  . insulin glargine  15 Units Subcutaneous Q2200  . lisinopril  40 mg Oral Daily  . multivitamin with minerals  1 tablet Oral Daily  . pantoprazole  40 mg Oral Daily  . polyethylene glycol  17 g Oral Daily  . saccharomyces boulardii  250 mg Oral BID  . senna  2 tablet Oral QHS  . vancomycin   750 mg Intravenous Q8H   Continuous Infusions:    LOS: 2 days    Time spent: 30 min    Janece Canterbury, MD [facility] Pager [phone]  If 7PM-7AM, please contact night-coverage [email or web address] Password [email or web address] 01/14/2016, 12:18 PM

## 2016-01-15 LAB — GLUCOSE, CAPILLARY
GLUCOSE-CAPILLARY: 237 mg/dL — AB (ref 65–99)
Glucose-Capillary: 186 mg/dL — ABNORMAL HIGH (ref 65–99)
Glucose-Capillary: 129 mg/dL — ABNORMAL HIGH (ref 65–99)
Glucose-Capillary: 264 mg/dL — ABNORMAL HIGH (ref 65–99)

## 2016-01-15 LAB — BASIC METABOLIC PANEL
ANION GAP: 7 (ref 5–15)
BUN: 17 mg/dL (ref 6–20)
CHLORIDE: 103 mmol/L (ref 101–111)
CO2: 28 mmol/L (ref 22–32)
Calcium: 8.9 mg/dL (ref 8.9–10.3)
Creatinine, Ser: 1.07 mg/dL (ref 0.61–1.24)
Glucose, Bld: 225 mg/dL — ABNORMAL HIGH (ref 65–99)
POTASSIUM: 4.3 mmol/L (ref 3.5–5.1)
SODIUM: 138 mmol/L (ref 135–145)

## 2016-01-15 LAB — CBC
HEMATOCRIT: 24.8 % — AB (ref 39.0–52.0)
HEMOGLOBIN: 8 g/dL — AB (ref 13.0–17.0)
MCH: 30.3 pg (ref 26.0–34.0)
MCHC: 32.3 g/dL (ref 30.0–36.0)
MCV: 93.9 fL (ref 78.0–100.0)
Platelets: 274 10*3/uL (ref 150–400)
RBC: 2.64 MIL/uL — AB (ref 4.22–5.81)
RDW: 13.2 % (ref 11.5–15.5)
WBC: 8.1 10*3/uL (ref 4.0–10.5)

## 2016-01-15 MED ORDER — HYDROCODONE-ACETAMINOPHEN 5-325 MG PO TABS
1.0000 | ORAL_TABLET | Freq: Four times a day (QID) | ORAL | Status: DC | PRN
  Administered 2016-01-15 – 2016-01-16 (×4): 1 via ORAL
  Filled 2016-01-15 (×4): qty 1

## 2016-01-15 MED ORDER — AMOXICILLIN-POT CLAVULANATE 875-125 MG PO TABS: 1.0000 | ORAL_TABLET | Freq: Two times a day (BID) | ORAL | 0 refills | Status: DC

## 2016-01-15 MED ORDER — SACCHAROMYCES BOULARDII 250 MG PO CAPS: 250.0000 mg | ORAL_CAPSULE | Freq: Two times a day (BID) | ORAL | 3 refills | Status: DC

## 2016-01-15 MED ORDER — IBUPROFEN 200 MG PO TABS: 400.0000 mg | ORAL_TABLET | Freq: Three times a day (TID) | ORAL | Status: DC

## 2016-01-15 MED ORDER — SODIUM CHLORIDE 0.9 % IV SOLN
1.5000 g | Freq: Four times a day (QID) | INTRAVENOUS | Status: DC
  Administered 2016-01-15 – 2016-01-16 (×2): 1.5 g via INTRAVENOUS
  Filled 2016-01-15 (×5): qty 1.5

## 2016-01-15 MED ORDER — MUPIROCIN 2 % EX OINT
TOPICAL_OINTMENT | Freq: Every day | CUTANEOUS | Status: DC
Start: 2016-01-15 — End: 2016-01-17
  Administered 2016-01-15 – 2016-01-16 (×2): via TOPICAL
  Filled 2016-01-15: qty 22

## 2016-01-15 MED ORDER — INSULIN ASPART 100 UNIT/ML ~~LOC~~ SOLN
5.0000 [IU] | Freq: Three times a day (TID) | SUBCUTANEOUS | Status: DC
  Administered 2016-01-15 – 2016-01-23 (×10): 5 [IU] via SUBCUTANEOUS

## 2016-01-15 MED ORDER — IBUPROFEN 200 MG PO TABS
400.0000 mg | ORAL_TABLET | Freq: Three times a day (TID) | ORAL | Status: DC
  Administered 2016-01-15 (×3): 400 mg via ORAL
  Filled 2016-01-15 (×3): qty 2

## 2016-01-15 MED ORDER — HYDROCODONE-ACETAMINOPHEN 5-325 MG PO TABS: 1.0000 | ORAL_TABLET | Freq: Four times a day (QID) | ORAL | Status: DC | PRN

## 2016-01-15 MED ORDER — INSULIN GLARGINE 100 UNIT/ML ~~LOC~~ SOLN
25.0000 [IU] | Freq: Every day | SUBCUTANEOUS | Status: DC
  Administered 2016-01-15 – 2016-01-23 (×8): 25 [IU] via SUBCUTANEOUS
  Filled 2016-01-15 (×9): qty 0.25

## 2016-01-15 MED ORDER — MUPIROCIN 2 % EX OINT: TOPICAL_OINTMENT | Freq: Every day | CUTANEOUS | 0 refills | Status: DC

## 2016-01-15 NOTE — Progress Notes (Signed)
Patient ID: Shane [name], male   DOB: [birth year], 45 y.o.   MRN: [id number] Patient seen in follow-up for cellulitis right foot. Examination patient does have thin blistering of the skin dorsally. The fluctuance in the foot has improved. His initial aspirate on Friday showed clear serosanguineous fluid no signs of abscess. The MRI scan was reviewed this morning which also shows the cellulitis but shows no discrete abscess no osteomyelitis.  Assessment improving cellulitis right foot.  Plan: Would continue the IV antibiotics. Patient has thin blistering skin dorsally and any surgical incision would compromise the circulation and patient could be at risk of a transtibial amputation with loss of the dorsal soft tissue envelope. I will follow-up during his hospitalization and as an outpatient.

## 2016-01-15 NOTE — Progress Notes (Addendum)
Pharmacy Antibiotic Note  Shane [name] is a 45 y.o. male admitted on 01/11/2016 with diabetic infection w/ high risk of MRSA. Currently on day #4 of Ancef and vancomycin.  Ancef discontinued tonight and pharmacy consulted for Unasyn dosing for cellulitis.   Plan: Start Unasyn 1.5 gm IV q6h Continue Vancomycin 750mg  IV q8h  Weekly vancomycin trough unless clinical course dictates otherwise   Height: 5\' 8"  (172.7 cm) (recorded on 12/30/15) Weight: 158 lb 15.9 oz (72.1 kg) (recorded on 12/30/15) IBW/kg (Calculated) : 68.4  Temp (24hrs), Avg:98.9 F (37.2 C), Min:98.3 F (36.8 C), Max:99.5 F (37.5 C)   Recent Labs Lab 01/12/16 0110 01/12/16 0256 01/13/16 0252 01/13/16 1156 01/14/16 1957 01/15/16 0322  WBC 9.1  --  7.3 9.9  --  8.1  CREATININE 1.12  --   --   --   --  1.07  LATICACIDVEN  --  1.14  --   --   --   --   VANCOTROUGH  --   --   --   --  16  --     Estimated Creatinine Clearance: 83.5 mL/min (by C-G formula based on SCr of 1.07 mg/dL).    Allergies  Allergen Reactions  . Other Other (See Comments)    Red meat causes stomach pains, bloating and diarrhea  . Milk-Related Compounds Diarrhea and Other (See Comments)    Any dairy products  - diarrhea and bloating    Antimicrobials this admission: Zosyn 7/28 x1 CTX 7/28 x1 Flagyl 7/2 x2  Vanc 7/28>>  Ancef 7/28>>7/31  Unasyn 7/31>>  Vancomycin trough levels:  VT 16 on 01/14/16, drawn correctly on dose of vancomycin 750 mg every 8 hours. SCr 1.12   Dose adjustments this admission:  No adjustments   Microbiology results: 7/28 BCx: ngtd 7/28 UCx: ngF  Thank you for allowing pharmacy to be a part of this patient's care.  Nicole Cella, RPh Clinical Pharmacist Pager: [phone] 01/15/2016 6:35 PM

## 2016-01-15 NOTE — Discharge Instructions (Signed)

## 2016-01-15 NOTE — Progress Notes (Signed)
PROGRESS NOTE  Shane [name]  [id number] DOB: 09-Dec-1970 DOA: 01/11/2016 PCP: Minerva Ends, MD  Brief Narrative:   Shane [name] is a 45 y.o. male with medical history significant of DM2.  Patient recently hospitalized at the start of this month for cellulitis and abscess of the left foot and had an open wound that was treated with wound vac to dorsal surface.  Seeing Dr. Marlou Sa and Dr. Sharol Given.  He was discharged on Doxycyline.  He reports increased ulceration of the distal portion of the ulcer which previously was flush with the surface of his skin, but no increased erythema, induration, odor, or discharge.  He presented due to increased erythema of the right lower extremity which he had injured on his wheelchair.    Assessment & Plan:   Principal Problem:   Cellulitis of right ankle Active Problems:   Essential hypertension   Diabetic foot infection (HCC)   Cellulitis and abscess, left foot   Type 2 diabetes mellitus with vascular disease (HCC)   Cellulitis of left ankle  Cellulitis of the right ankle.  Source likely the traumatic wound to the dorsum of his foot.  Most likely this reflects strep infection which is not as well treated by doxycycline.  Erythema resolving, but area of fluctuance developing on dorsum of foot -  Continue ancef  -  Started florastor -  Continue hydrocodone for pain -  Bedside drainage of serosanguinous fluid by Dr. Sharol Given on 7/28 -  MRI demonstrates some subcutaneous air (may have been introduced by needle), but no fluid collection  -  Appreciate orthopedics assistance  Left foot ulcer appears stable.  -  X-ray negative for bony involvement or subcutaneous air -  Continue vancomycin for now as may need surgical debridement of the right foot  Hypertension, blood pressures stable -  Continue carvedilol and lisinopril  Diabetes mellitus type 2, CBG still elevated.  Diabetic neuropathy -  Hold metformin and glipizide -  Increase lantus to 25 units -   Increase to standing aspart 5 units with meals -  Continue SSI -  Continue gabapentin  GERD, stable, continue PPI  Depression/anxiety, stable, continue fluoxetine  Urinary frequency, may be due to elevated CBG causing osmotic diuresis.  UA negative for infection but demonstrates copious glucosuria.    DVT prophylaxis:  lovenox Code Status:  full Family Communication:  Patient alone Disposition Plan:   Continue IV antibiotics   Consultants:   Dr. Sharol Given, Orthopedics  Procedures:  none  Antimicrobials:   Vancomycin 7/28 >   Ceftriaxone and flagyl 7/28  Ancef 7/28 >     Subjective:  Foot is hurting more today.  (Vicodin order expired).  Disappointed that he will not be having surgery to relieve his infection.    Objective: Vitals:   01/14/16 0354 01/14/16 1431 01/14/16 2022 01/15/16 0400  BP: 131/65 134/72 (!) 143/85 (!) 166/96  Pulse: 80 84 93 89  Resp: 16 16    Temp: 98.4 F (36.9 C) 98.3 F (36.8 C) 99.5 F (37.5 C) 99 F (37.2 C)  TempSrc: Oral Oral Oral Oral  SpO2: 95% 96% 99% 96%    Intake/Output Summary (Last 24 hours) at 01/15/16 1226 Last data filed at 01/14/16 2300  Gross per 24 hour  Intake              880 ml  Output                0 ml  Net  880 ml   There were no vitals filed for this visit.  Examination:  General exam:  Adult male.  No acute distress.  HEENT:  NCAT, MMM Respiratory system: Clear to auscultation bilaterally Cardiovascular system: Regular rate and rhythm, normal S1/S2. No murmurs, rubs, gallops or clicks.  Warm extremities Gastrointestinal system: Normal active bowel sounds, soft, nondistended, nontender. MSK:  RIGHT:  Pink erythema of the right foot localized to dorsum of foot.  Edema on top of foot feels firmer today.  Top of foot, skin is sloughing and oozing serosanguinous fluid.  LEFT:  5cm dorsum ulcer appears to have healthy granulation tissue proximally and some slough along the distal portion Neuro:   Grossly intact    Data Reviewed: I have personally reviewed following labs and imaging studies  CBC:  Recent Labs Lab 01/12/16 0110 01/13/16 0252 01/13/16 1156 01/15/16 0322  WBC 9.1 7.3 9.9 8.1  NEUTROABS 6.2  --   --   --   HGB 9.3* 7.7* 8.9* 8.0*  HCT 28.3* 24.5* 28.2* 24.8*  MCV 93.4 94.2 94.9 93.9  PLT 273 256 293 [id number]   Basic Metabolic Panel:  Recent Labs Lab 01/12/16 0110 01/15/16 0322  NA 139 138  K 4.9 4.3  CL 105 103  CO2 26 28  GLUCOSE 288* 225*  BUN 16 17  CREATININE 1.12 1.07  CALCIUM 9.2 8.9   GFR: CrCl cannot be calculated (Unknown ideal weight.). Liver Function Tests: No results for input(s): AST, ALT, ALKPHOS, BILITOT, PROT, ALBUMIN in the last 168 hours. No results for input(s): LIPASE, AMYLASE in the last 168 hours. No results for input(s): AMMONIA in the last 168 hours. Coagulation Profile: No results for input(s): INR, PROTIME in the last 168 hours. Cardiac Enzymes: No results for input(s): CKTOTAL, CKMB, CKMBINDEX, TROPONINI in the last 168 hours. BNP (last 3 results) No results for input(s): PROBNP in the last 8760 hours. HbA1C: No results for input(s): HGBA1C in the last 72 hours. CBG:  Recent Labs Lab 01/14/16 1115 01/14/16 1638 01/14/16 2052 01/15/16 0710 01/15/16 1140  GLUCAP 179* 157* 198* 186* 237*   Lipid Profile: No results for input(s): CHOL, HDL, LDLCALC, TRIG, CHOLHDL, LDLDIRECT in the last 72 hours. Thyroid Function Tests: No results for input(s): TSH, T4TOTAL, FREET4, T3FREE, THYROIDAB in the last 72 hours. Anemia Panel: No results for input(s): VITAMINB12, FOLATE, FERRITIN, TIBC, IRON, RETICCTPCT in the last 72 hours. Urine analysis:    Component Value Date/Time   COLORURINE YELLOW 01/12/2016 1756   APPEARANCEUR CLEAR 01/12/2016 1756   LABSPEC 1.022 01/12/2016 1756   PHURINE 6.0 01/12/2016 1756   GLUCOSEU >1000 (A) 01/12/2016 1756   HGBUR NEGATIVE 01/12/2016 1756   BILIRUBINUR NEGATIVE 01/12/2016 1756    KETONESUR NEGATIVE 01/12/2016 1756   PROTEINUR 30 (A) 01/12/2016 1756   UROBILINOGEN 0.2 04/24/2015 1056   NITRITE NEGATIVE 01/12/2016 1756   LEUKOCYTESUR NEGATIVE 01/12/2016 1756   Sepsis Labs: @LABRCNTIP (procalcitonin:4,lacticidven:4)  ) Recent Results (from the past 240 hour(s))  Culture, blood (Routine X 2) w Reflex to ID Panel     Status: None (Preliminary result)   Collection Time: 01/12/16  2:45 AM  Result Value Ref Range Status   Specimen Description BLOOD RIGHT ARM  Final   Special Requests BOTTLES DRAWN AEROBIC AND ANAEROBIC 10ML  Final   Culture NO GROWTH 2 DAYS  Final   Report Status PENDING  Incomplete  Culture, blood (Routine X 2) w Reflex to ID Panel     Status: None (Preliminary result)  Collection Time: 01/12/16  2:55 AM  Result Value Ref Range Status   Specimen Description BLOOD RIGHT ARM  Final   Special Requests IN PEDIATRIC BOTTLE 4ML  Final   Culture NO GROWTH 2 DAYS  Final   Report Status PENDING  Incomplete  Culture, Urine     Status: None   Collection Time: 01/12/16  5:56 PM  Result Value Ref Range Status   Specimen Description URINE, RANDOM  Final   Special Requests NONE  Final   Culture NO GROWTH  Final   Report Status 01/13/2016 FINAL  Final      Radiology Studies: Mr Foot Right W Wo Contrast  Result Date: 01/14/2016 CLINICAL DATA:  Pain and swelling. Diabetic foot ulcer on the dorsum of the foot. EXAM: MRI OF THE RIGHT FOREFOOT WITHOUT AND WITH CONTRAST TECHNIQUE: Multiplanar, multisequence MR imaging was performed both before and after administration of intravenous contrast. CONTRAST:  20mL MULTIHANCE GADOBENATE DIMEGLUMINE 529 MG/ML IV SOLN COMPARISON:  Radiographs 01/11/2016. FINDINGS: Diffuse subcutaneous soft tissue swelling/ edema suggesting cellulitis. Gas is noted in the dorsal soft tissues which could be due to the open wound for gas producing infection. No discrete drainable soft tissue abscess. No definite MR findings for septic arthritis  or osteomyelitis. Mild myofasciitis. No findings for pyomyositis. IMPRESSION: Diffuse and severe cellulitis along the dorsum of the foot without discrete drainable soft tissue abscess, pyomyositis, septic arthritis or osteomyelitis. As demonstrated on the plain films there is gas in the dorsal subcutaneous tissues. Electronically Signed   By: Marijo Sanes M.D.   On: 01/14/2016 13:03    Scheduled Meds: . aspirin EC  325 mg Oral Daily  . atorvastatin  80 mg Oral q1800  . carvedilol  12.5 mg Oral Q breakfast  .  ceFAZolin (ANCEF) IV  1 g Intravenous Q8H  . divalproex  1,000 mg Oral QHS  . enoxaparin (LOVENOX) injection  40 mg Subcutaneous Q24H  . feeding supplement (GLUCERNA SHAKE)  237 mL Oral BID BM  . ferrous sulfate  325 mg Oral Q breakfast  . FLUoxetine  10 mg Oral Daily  . gabapentin  100 mg Oral TID  . ibuprofen  400 mg Oral TID  . insulin aspart  0-15 Units Subcutaneous TID WC  . insulin aspart  3 Units Subcutaneous TID WC  . insulin glargine  20 Units Subcutaneous Q2200  . lisinopril  40 mg Oral Daily  . multivitamin with minerals  1 tablet Oral Daily  . mupirocin ointment   Topical Daily  . pantoprazole  40 mg Oral Daily  . polyethylene glycol  17 g Oral Daily  . saccharomyces boulardii  250 mg Oral BID  . senna  2 tablet Oral QHS  . vancomycin  750 mg Intravenous Q8H   Continuous Infusions:    LOS: 3 days    Time spent: 30 min    Janece Canterbury, MD [facility] Pager [phone]  If 7PM-7AM, please contact night-coverage [email or web address] Password [facility] 01/15/2016, 12:26 PM

## 2016-01-16 DIAGNOSIS — L97529 Non-pressure chronic ulcer of other part of left foot with unspecified severity: Secondary | ICD-10-CM

## 2016-01-16 DIAGNOSIS — L97519 Non-pressure chronic ulcer of other part of right foot with unspecified severity: Secondary | ICD-10-CM

## 2016-01-16 DIAGNOSIS — E11621 Type 2 diabetes mellitus with foot ulcer: Principal | ICD-10-CM

## 2016-01-16 DIAGNOSIS — B9689 Other specified bacterial agents as the cause of diseases classified elsewhere: Secondary | ICD-10-CM

## 2016-01-16 DIAGNOSIS — M25476 Effusion, unspecified foot: Secondary | ICD-10-CM

## 2016-01-16 DIAGNOSIS — L03115 Cellulitis of right lower limb: Secondary | ICD-10-CM

## 2016-01-16 LAB — CBC WITH DIFFERENTIAL/PLATELET
BASOS ABS: 0 10*3/uL (ref 0.0–0.1)
BASOS PCT: 0 %
EOS ABS: 0.4 10*3/uL (ref 0.0–0.7)
EOS PCT: 4 %
HCT: 26.1 % — ABNORMAL LOW (ref 39.0–52.0)
Hemoglobin: 8.3 g/dL — ABNORMAL LOW (ref 13.0–17.0)
LYMPHS ABS: 2 10*3/uL (ref 0.7–4.0)
Lymphocytes Relative: 21 %
MCH: 30.2 pg (ref 26.0–34.0)
MCHC: 31.8 g/dL (ref 30.0–36.0)
MCV: 94.9 fL (ref 78.0–100.0)
Monocytes Absolute: 0.7 10*3/uL (ref 0.1–1.0)
Monocytes Relative: 7 %
NEUTROS PCT: 69 %
Neutro Abs: 6.7 10*3/uL (ref 1.7–7.7)
PLATELETS: 327 10*3/uL (ref 150–400)
RBC: 2.75 MIL/uL — AB (ref 4.22–5.81)
RDW: 13.3 % (ref 11.5–15.5)
WBC: 9.7 10*3/uL (ref 4.0–10.5)

## 2016-01-16 LAB — BASIC METABOLIC PANEL
Anion gap: 6 (ref 5–15)
BUN: 20 mg/dL (ref 6–20)
CHLORIDE: 107 mmol/L (ref 101–111)
CO2: 28 mmol/L (ref 22–32)
CREATININE: 1.04 mg/dL (ref 0.61–1.24)
Calcium: 9.1 mg/dL (ref 8.9–10.3)
GFR calc Af Amer: >60 mL/min (ref >60)
GFR calc non Af Amer: >60 mL/min (ref >60)
GLUCOSE: 114 mg/dL — AB (ref 65–99)
POTASSIUM: 4.3 mmol/L (ref 3.5–5.1)
SODIUM: 141 mmol/L (ref 135–145)

## 2016-01-16 LAB — PROTIME-INR
INR: 1.06
PROTHROMBIN TIME: 13.8 s (ref 11.4–15.2)

## 2016-01-16 LAB — CBC
HEMATOCRIT: 20.6 % — AB (ref 39.0–52.0)
Hemoglobin: 6.5 g/dL — CL (ref 13.0–17.0)
MCH: 29.7 pg (ref 26.0–34.0)
MCHC: 31.6 g/dL (ref 30.0–36.0)
MCV: 94.1 fL (ref 78.0–100.0)
PLATELETS: 383 10*3/uL (ref 150–400)
RBC: 2.19 MIL/uL — ABNORMAL LOW (ref 4.22–5.81)
RDW: 13.5 % (ref 11.5–15.5)
WBC: 10.3 10*3/uL (ref 4.0–10.5)

## 2016-01-16 LAB — RETICULOCYTES
RBC.: 2.75 MIL/uL — ABNORMAL LOW (ref 4.22–5.81)
RETIC CT PCT: 2.1 % (ref 0.4–3.1)
Retic Count, Absolute: 57.8 10*3/uL (ref 19.0–186.0)

## 2016-01-16 LAB — GLUCOSE, CAPILLARY
GLUCOSE-CAPILLARY: 120 mg/dL — AB (ref 65–99)
GLUCOSE-CAPILLARY: 141 mg/dL — AB (ref 65–99)
GLUCOSE-CAPILLARY: 186 mg/dL — AB (ref 65–99)
Glucose-Capillary: 209 mg/dL — ABNORMAL HIGH (ref 65–99)

## 2016-01-16 LAB — FIBRINOGEN: Fibrinogen: 759 mg/dL — ABNORMAL HIGH (ref 210–475)

## 2016-01-16 LAB — DIRECT ANTIGLOBULIN TEST (NOT AT ARMC)
DAT, IgG: NEGATIVE
DAT, complement: NEGATIVE

## 2016-01-16 LAB — SAVE SMEAR

## 2016-01-16 LAB — LACTATE DEHYDROGENASE: LDH: 265 U/L — AB (ref 98–192)

## 2016-01-16 MED ORDER — ENOXAPARIN SODIUM 40 MG/0.4ML ~~LOC~~ SOLN
40.0000 mg | SUBCUTANEOUS | Status: DC
  Administered 2016-01-16 – 2016-01-17 (×2): 40 mg via SUBCUTANEOUS
  Filled 2016-01-16 (×2): qty 0.4

## 2016-01-16 MED ORDER — IBUPROFEN 200 MG PO TABS
400.0000 mg | ORAL_TABLET | Freq: Three times a day (TID) | ORAL | Status: DC
Start: 2016-01-16 — End: 2016-01-19
  Administered 2016-01-17 – 2016-01-19 (×5): 400 mg via ORAL
  Filled 2016-01-16 (×6): qty 2

## 2016-01-16 MED ORDER — PIPERACILLIN-TAZOBACTAM 3.375 G IVPB
3.3750 g | Freq: Three times a day (TID) | INTRAVENOUS | Status: DC
  Administered 2016-01-16 – 2016-01-19 (×10): 3.375 g via INTRAVENOUS
  Filled 2016-01-16 (×13): qty 50

## 2016-01-16 MED ORDER — CHLORHEXIDINE GLUCONATE 4 % EX LIQD
60.0000 mL | Freq: Once | CUTANEOUS | Status: AC
  Administered 2016-01-16: 4 via TOPICAL
  Filled 2016-01-16: qty 60

## 2016-01-16 MED ORDER — SODIUM CHLORIDE 0.45 % IV SOLN
INTRAVENOUS | Status: DC
  Administered 2016-01-16: 22:00:00 via INTRAVENOUS

## 2016-01-16 MED ORDER — LINEZOLID 600 MG/300ML IV SOLN
600.0000 mg | Freq: Two times a day (BID) | INTRAVENOUS | Status: DC
  Administered 2016-01-16 – 2016-01-22 (×12): 600 mg via INTRAVENOUS
  Filled 2016-01-16 (×15): qty 300

## 2016-01-16 MED ORDER — HYDROCODONE-ACETAMINOPHEN 5-325 MG PO TABS
2.0000 | ORAL_TABLET | Freq: Four times a day (QID) | ORAL | Status: AC | PRN
  Administered 2016-01-16 – 2016-01-17 (×5): 2 via ORAL
  Filled 2016-01-16 (×5): qty 2

## 2016-01-16 MED ORDER — GABAPENTIN 100 MG PO CAPS
200.0000 mg | ORAL_CAPSULE | Freq: Three times a day (TID) | ORAL | Status: DC
  Administered 2016-01-16 – 2016-01-23 (×18): 200 mg via ORAL
  Filled 2016-01-16 (×21): qty 2

## 2016-01-16 NOTE — Progress Notes (Signed)
Pharmacy Antibiotic Note  Shane [name] is a 45 y.o. male admitted on 01/11/2016 for R ankle cellulitis. Recent left foot cellulitis and abscess for which he was discharged on doxycycline. Pharmacy consulted for vancomycin dosing and to change Unasyn to Zosyn. He remains afebrile, WBC down to normal, and renal function is stable.  Plan: Continue vancomycin 750 mg IV q8h  Zosyn 3.375 g IV q8h to be infused over 4 hours Monitor renal function, clinical progress, and culture data Weekly vancomycin trough unless clinical course dictates otherwise   Height: 5\' 8"  (172.7 cm) (recorded on 12/30/15) Weight: 158 lb 15.9 oz (72.1 kg) (recorded on 12/30/15) IBW/kg (Calculated) : 68.4  Temp (24hrs), Avg:98 F (36.7 C), Min:97.7 F (36.5 C), Max:98.3 F (36.8 C)   Recent Labs Lab 01/12/16 0110 01/12/16 0256 01/13/16 0252 01/13/16 1156 01/14/16 1957 01/15/16 0322 01/16/16 0609 01/16/16 0914  WBC 9.1  --  7.3 9.9  --  8.1 10.3 9.7  CREATININE 1.12  --   --   --   --  1.07  --   --   LATICACIDVEN  --  1.14  --   --   --   --   --   --   VANCOTROUGH  --   --   --   --  16  --   --   --     Estimated Creatinine Clearance: 83.5 mL/min (by C-G formula based on SCr of 1.07 mg/dL).    Allergies  Allergen Reactions  . Other Other (See Comments)    Red meat causes stomach pains, bloating and diarrhea  . Milk-Related Compounds Diarrhea and Other (See Comments)    Any dairy products  - diarrhea and bloating    Antimicrobials this admission: Zosyn 7/28 x1, 8/1>> CTX 7/28 x1 Flagyl 7/2 x2 Vanc 7/28 >>     7/30 VT = 16 (Continue 750mg  IV Q8) Ancef 7/28 >>7/31 Unasyn 7/31>>8/1  Vancomycin trough levels:  VT 16 on 01/14/16, drawn correctly on dose of vancomycin 750 mg every 8 hours. SCr 1.12   Dose adjustments this admission:  No adjustments   Microbiology results: 7/28 BCx: ngtd 7/28 UCx: ngF  Thank you for allowing pharmacy to be a part of this patient's care.  Renold Genta,  PharmD, BCPS Clinical Pharmacist Phone for today - [place] - [phone] 01/16/2016 11:10 AM

## 2016-01-16 NOTE — Progress Notes (Signed)
ANTICOAGULATION CONSULT NOTE - Initial Consult  Pharmacy Consult for Lovenox  Indication: VTE prophylaxis  Allergies  Allergen Reactions  . Other Other (See Comments)    Red meat causes stomach pains, bloating and diarrhea  . Milk-Related Compounds Diarrhea and Other (See Comments)    Any dairy products  - diarrhea and bloating    Patient Measurements: Height: _0  (172.7 cm) (recorded on 12/30/15) Weight: 158 lb 15.9 oz (72.1 kg) (recorded on 12/30/15) IBW/kg (Calculated) : 68.4 Heparin Dosing Weight:   Vital Signs: Temp: 99.1 F (37.3 C) (08/01 1300) BP: 149/84 (08/01 1300) Pulse Rate: 104 (08/01 1300)  Labs:  Recent Labs  01/15/16 0322 01/16/16 0609 01/16/16 0914  HGB 8.0* 6.5* 8.3*  HCT 24.8* 20.6* 26.1*  PLT 274 383 327  LABPROT  --   --  13.8  INR  --   --  1.06  CREATININE 1.07 1.04  --     Estimated Creatinine Clearance: 85.9 mL/min (by C-G formula based on SCr of 1.04 mg/dL).   Medical History: Past Medical History:  Diagnosis Date  . Anemia   . Anxiety   . Bipolar disorder ([place])   . Chest pain    a. 2015 Reportedly normal stress test in [place].  Marland Kitchen Chronic diastolic CHF (congestive heart failure) (HCC)    a.03/2015 Echo: EF 55-60%, Gr 1 DD, mild MR, triv PR.  . Depression   . GERD (gastroesophageal reflux disease)   . Hyperlipidemia   . Hypertension    a. 08/2014 Admitted with hypertensive urgency.  . Refusal of blood transfusions as patient is Jehovah's Witness   . Sleep apnea   . TIA (transient ischemic attack) 08/2014; 03/2015   a. 08/2014 in setting of hypertensive urgency.  . Type II diabetes mellitus ([place])     Medications:  Prescriptions Prior to Admission  Medication Sig Dispense Refill Last Dose  . aspirin EC 325 MG tablet Take 1 tablet (325 mg total) by mouth daily. 30 tablet 0 01/11/2016 at Unknown time  . atorvastatin (LIPITOR) 80 MG tablet Take 1 tablet (80 mg total) by mouth every morning. 30 tablet 5 01/11/2016 at Unknown time  .  carvedilol (COREG) 12.5 MG tablet TAKE 1 TABLET BY MOUTH 2 TIMES DAILY WITH A MEAL (Patient taking differently: TAKE 1 TABLET BY MOUTH IN THE MORNING.) 60 tablet 3 01/11/2016 at 0800  . divalproex (DEPAKOTE) 500 MG DR tablet Take 2 tablets (1,000 mg total) by mouth at bedtime. 30 tablet 5 01/11/2016 at Unknown time  . ferrous sulfate 325 (65 FE) MG tablet Take 1 tablet (325 mg total) by mouth 2 (two) times daily with a meal. (Patient taking differently: Take 325 mg by mouth daily with breakfast. ) 60 tablet 5 01/11/2016 at Unknown time  . FLUoxetine (PROZAC) 10 MG tablet Take 1 tablet (10 mg total) by mouth every morning. 30 tablet 3 01/11/2016 at Unknown time  . gabapentin (NEURONTIN) 100 MG capsule Take 1 capsule (100 mg total) by mouth 3 (three) times daily. 90 capsule 3 01/11/2016 at Unknown time  . glipiZIDE (GLUCOTROL) 10 MG tablet Take 1 tablet (10 mg total) by mouth 2 (two) times daily before a meal. (Patient taking differently: Take 10 mg by mouth daily before breakfast. ) 60 tablet 5 01/11/2016 at Unknown time  . Insulin Glargine (LANTUS SOLOSTAR) 100 UNIT/ML Solostar Pen Inject 10 Units into the skin daily at 10 pm. 5 pen 3 01/11/2016 at Unknown time  . lisinopril (PRINIVIL,ZESTRIL) 40 MG tablet Take  1 tablet (40 mg total) by mouth daily. 30 tablet 5 01/11/2016 at Unknown time  . metFORMIN (GLUCOPHAGE) 1000 MG tablet Take 1 tablet (1,000 mg total) by mouth 2 (two) times daily with a meal. 60 tablet 5 01/11/2016 at Unknown time  . omeprazole (PRILOSEC) 20 MG capsule Take 1 capsule (20 mg total) by mouth daily. 30 capsule 5 01/11/2016 at Unknown time  . traZODone (DESYREL) 50 MG tablet Take 0.5-1 tablets (25-50 mg total) by mouth at bedtime as needed for sleep. 30 tablet 3 01/11/2016 at Unknown time  . Blood Glucose Monitoring Suppl (TRUE METRIX METER) w/Device KIT Use as directed 1 kit 0   . glucose blood (TRUE METRIX BLOOD GLUCOSE TEST) test strip Use as instructed 100 each 12   . Insulin Pen Needle  (B-D ULTRAFINE III SHORT PEN) 31G X 8 MM MISC 1 application by Does not apply route daily. 30 each 11   . TRUEPLUS LANCETS 28G MISC Use as directed 100 each 12     Assessment: 45 y.o male here with cellultis of right ankle and he was admitted on 01/11/16. He has received lovenox 26m SQ q24h for VTE prophylaxis since 01/13/16. Hgb this AM was 6.5 and lovenox was discontinued this morning.   Last given on 7/31 @ 17:15.  Hgb rechecked today was 8.3. Pharmacy consulted tonight for lovenox dosing for VTE prophylaxis.  Pltc wnl and SCr = 1.04, CrCl 85 ml/min, weight = 72kg.   Goal of Therapy:  antiXa level 0.3-0.6 units/ml 4 hours after LMWH dose Monitor platelets by anticoagulation protocol: Yes   Plan:  Resume previous order for lovenox 445mSQ q24h for VTE prophylaxis.  Pharmacy will sign off.  RuNicole CellaRPh Clinical Pharmacist Pager: [phone]/06/2015,8:17 PM

## 2016-01-16 NOTE — Progress Notes (Signed)
CRITICAL VALUE ALERT  Critical value received:  hemoglbin 6.5  Date of notification:  01/16/16  Time of notification: 0712  Critical value read back:Yes.    Nurse who received alert:  Lowella Grip  MD notified (1st page):  Short.M TRH  Time of first page:  0715  MD notified (2nd page): Short M TRH    Time of second page: [phone]  Responding MD:  Short. M  Time MD responded:  0825; at bedside

## 2016-01-16 NOTE — Consult Note (Signed)
Date of Admission:  01/11/2016  Date of Consult:  01/16/2016  Reason for Consult: Worsening diabetic foot infection with likely abscess development Referring Physician: Dr. Sheran Fava   HPI: Shane [name] is an 45 y.o. male with PMHx signifcant for DM with diabetic foot ulcers with need for a first toe amputation second toe partially dictation having been performed by Dr. Sharol Given and the past 2 was admitted to the hospital with onset of a blistering diabetic foot ulcer on the dorsum of his right foot. He had aggressive imaging with an MRI that did not show evidence of osteomyelitis. Was no abscess seen either though there was gas seen in the subcutaneous tissues both on plain films and by MRI. The patient was seen by Dr. Sharol Given who felt that surgical intervention when he saw the patient could've been deleterious to the patient and results of the patient requiring amputation. However since having been seen the patient's condition has worsened dramatically particularly the last 24 hours. His foot has swollen up dramatically as has his ankle and his toenail popped off this morning is had increased drainage from his wound is well and he has had fevers despite appropriate antibiotics by his were broadened from recent antibiotic therapy with vancomycin and cefazolin then vancomycin and use of Unasyn to vancomycin and Zosyn today.  I do not think that rearranged antibiotics this point times would make any difference I think unfortunate patient does need a surgical intervention at I think he likely has a developing abscess in his foot that will require debridement at which point time deep cultures can be taken to guide further therapy.  Past Medical History:  Diagnosis Date  . Anemia   . Anxiety   . Bipolar disorder ([place])   . Chest pain    a. 2015 Reportedly normal stress test in [place].  Marland Kitchen Chronic diastolic CHF (congestive heart failure) (HCC)    a.03/2015 Echo: EF 55-60%, Gr 1 DD, mild MR, triv PR.  .  Depression   . GERD (gastroesophageal reflux disease)   . Hyperlipidemia   . Hypertension    a. 08/2014 Admitted with hypertensive urgency.  . Refusal of blood transfusions as patient is Jehovah's Witness   . Sleep apnea   . TIA (transient ischemic attack) 08/2014; 03/2015   a. 08/2014 in setting of hypertensive urgency.  . Type II diabetes mellitus ([place])     Past Surgical History:  Procedure Laterality Date  . AMPUTATION Left 08/03/2015   Procedure: AMPUTATION LEFT GREAT TOE;  Surgeon: Newt Minion, MD;  Location: [facility];  Service: Orthopedics;  Laterality: Left;  . AMPUTATION Left 12/02/2015   Procedure: amputation of left 2nd digit  . APPLICATION OF WOUND VAC Left 12/19/2015   Procedure: APPLICATION OF WOUND VAC;  Surgeon: Meredith Pel, MD;  Location: [facility];  Service: Orthopedics;  Laterality: Left;  . CIRCUMCISION    . I&D EXTREMITY Left 12/02/2015   Procedure: IRRIGATION AND DEBRIDEMENT OF FOOT; LEFT SECOND TOE AMPUTATION;  Surgeon: Meredith Pel, MD;  Location: [facility];  Service: Orthopedics;  Laterality: Left;  . I&D EXTREMITY Left 12/19/2015   Procedure: I & D LEFT FOOT WITH BEADS ;  Surgeon: Meredith Pel, MD;  Location: [facility];  Service: Orthopedics;  Laterality: Left;  . INGUINAL HERNIA REPAIR Bilateral ~ 1983- ~ 1986  . TONSILLECTOMY  ~ 24    Social History:  reports that he has never smoked. He has never used smokeless tobacco.  He reports that he does not drink alcohol or use drugs.   Family History  Problem Relation Age of Onset  . Hypertension Mother   . Diabetes Mother   . Hyperlipidemia Mother   . Heart disease Mother     s/p pacemaker  . Diabetes Father   . Hypertension Father   . Stroke Father   . Heart attack Father     first MI @ 89.  . Stroke Brother     Allergies  Allergen Reactions  . Other Other (See Comments)    Red meat causes stomach pains, bloating and diarrhea  . Milk-Related Compounds Diarrhea and Other (See Comments)    Any  dairy products  - diarrhea and bloating     Medications: I have reviewed patients current medications as documented in Epic Anti-infectives    Start     Dose/Rate Route Frequency Ordered Stop   01/16/16 1000  piperacillin-tazobactam (ZOSYN) IVPB 3.375 g     3.375 g 12.5 mL/hr over 240 Minutes Intravenous Every 8 hours 01/16/16 0850     01/15/16 1930  ampicillin-sulbactam (UNASYN) 1.5 g in sodium chloride 0.9 % 50 mL IVPB  Status:  Discontinued     1.5 g 100 mL/hr over 30 Minutes Intravenous Every 6 hours 01/15/16 1841 01/16/16 0849   01/15/16 0000  amoxicillin-clavulanate (AUGMENTIN) 875-125 MG tablet     1 tablet Oral 2 times daily 01/15/16 1629     01/12/16 1700  ceFAZolin (ANCEF) IVPB 1 g/50 mL premix  Status:  Discontinued     1 g 100 mL/hr over 30 Minutes Intravenous Every 8 hours 01/12/16 1624 01/15/16 1808   01/12/16 1200  vancomycin (VANCOCIN) IVPB 750 mg/150 ml premix     750 mg 150 mL/hr over 60 Minutes Intravenous Every 8 hours 01/12/16 0422     01/12/16 0600  metroNIDAZOLE (FLAGYL) tablet 500 mg  Status:  Discontinued     500 mg Oral Every 8 hours 01/12/16 0413 01/12/16 1613   01/12/16 0500  cefTRIAXone (ROCEPHIN) 2 g in dextrose 5 % 50 mL IVPB  Status:  Discontinued     2 g 100 mL/hr over 30 Minutes Intravenous Every 24 hours 01/12/16 0413 01/12/16 1613   01/12/16 0245  vancomycin (VANCOCIN) IVPB 1000 mg/200 mL premix     1,000 mg 200 mL/hr over 60 Minutes Intravenous  Once 01/12/16 0238 01/12/16 0426   01/12/16 0245  piperacillin-tazobactam (ZOSYN) IVPB 3.375 g     3.375 g 100 mL/hr over 30 Minutes Intravenous  Once 01/12/16 0238 01/12/16 0343         ROS:  as in HPI otherwise remainder of 12 point Review of Systems is negative   Blood pressure (!) 149/84, pulse (!) 104, temperature 99.1 F (37.3 C), resp. rate 18, height 5' 8"  (1.727 m), weight 158 lb 15.9 oz (72.1 kg), SpO2 98 %. General: Alert and awake, oriented x3, not in any acute distress. HEENT:  anicteric sclera,  EOMI, oropharynx clear and without exudate Cardiovascular: regular rate, normal r,  no murmur rubs or gallops Pulmonary: clear to auscultation bilaterally, no wheezing, rales or rhonchi Gastrointestinal: soft nontender, nondistended, normal bowel sounds, Musculoskeletal:Skin,  01/16/16:  Right foot       Neuro: nonfocal, strength and sensation intact   Results for orders placed or performed during the hospital encounter of 01/11/16 (from the past 48 hour(s))  Glucose, capillary     Status: Abnormal   Collection Time: 01/14/16  4:38 PM  Result Value  Ref Range   Glucose-Capillary 157 (H) 65 - 99 mg/dL   Comment 1 Notify RN   Vancomycin, trough     Status: None   Collection Time: 01/14/16  7:57 PM  Result Value Ref Range   Vancomycin Tr 16 15 - 20 ug/mL  Glucose, capillary     Status: Abnormal   Collection Time: 01/14/16  8:52 PM  Result Value Ref Range   Glucose-Capillary 198 (H) 65 - 99 mg/dL  Basic metabolic panel     Status: Abnormal   Collection Time: 01/15/16  3:22 AM  Result Value Ref Range   Sodium 138 135 - 145 mmol/L   Potassium 4.3 3.5 - 5.1 mmol/L   Chloride 103 101 - 111 mmol/L   CO2 28 22 - 32 mmol/L   Glucose, Bld 225 (H) 65 - 99 mg/dL   BUN 17 6 - 20 mg/dL   Creatinine, Ser 1.07 0.61 - 1.24 mg/dL   Calcium 8.9 8.9 - 10.3 mg/dL   GFR calc non Af Amer >60 >60 mL/min   GFR calc Af Amer >60 >60 mL/min    Comment: (NOTE) The eGFR has been calculated using the CKD EPI equation. This calculation has not been validated in all clinical situations. eGFR's persistently <60 mL/min signify possible Chronic Kidney Disease.    Anion gap 7 5 - 15  CBC     Status: Abnormal   Collection Time: 01/15/16  3:22 AM  Result Value Ref Range   WBC 8.1 4.0 - 10.5 K/uL   RBC 2.64 (L) 4.22 - 5.81 MIL/uL   Hemoglobin 8.0 (L) 13.0 - 17.0 g/dL   HCT 24.8 (L) 39.0 - 52.0 %   MCV 93.9 78.0 - 100.0 fL   MCH 30.3 26.0 - 34.0 pg   MCHC 32.3 30.0 - 36.0 g/dL    RDW 13.2 11.5 - 15.5 %   Platelets 274 150 - 400 K/uL  Glucose, capillary     Status: Abnormal   Collection Time: 01/15/16  7:10 AM  Result Value Ref Range   Glucose-Capillary 186 (H) 65 - 99 mg/dL  Glucose, capillary     Status: Abnormal   Collection Time: 01/15/16 11:40 AM  Result Value Ref Range   Glucose-Capillary 237 (H) 65 - 99 mg/dL   Comment 1 Repeat Test    Comment 2 Document in Chart   Glucose, capillary     Status: Abnormal   Collection Time: 01/15/16  5:06 PM  Result Value Ref Range   Glucose-Capillary 129 (H) 65 - 99 mg/dL   Comment 1 Repeat Test    Comment 2 Document in Chart   Glucose, capillary     Status: Abnormal   Collection Time: 01/15/16 10:55 PM  Result Value Ref Range   Glucose-Capillary 264 (H) 65 - 99 mg/dL   Comment 1 Notify RN    Comment 2 Document in Chart   Glucose, capillary     Status: Abnormal   Collection Time: 01/16/16  5:50 AM  Result Value Ref Range   Glucose-Capillary 120 (H) 65 - 99 mg/dL   Comment 1 Notify RN    Comment 2 Document in Chart   Basic metabolic panel     Status: Abnormal   Collection Time: 01/16/16  6:09 AM  Result Value Ref Range   Sodium 141 135 - 145 mmol/L   Potassium 4.3 3.5 - 5.1 mmol/L   Chloride 107 101 - 111 mmol/L   CO2 28 22 - 32 mmol/L   Glucose,  Bld 114 (H) 65 - 99 mg/dL   BUN 20 6 - 20 mg/dL   Creatinine, Ser 1.04 0.61 - 1.24 mg/dL   Calcium 9.1 8.9 - 10.3 mg/dL   GFR calc non Af Amer >60 >60 mL/min   GFR calc Af Amer >60 >60 mL/min    Comment: (NOTE) The eGFR has been calculated using the CKD EPI equation. This calculation has not been validated in all clinical situations. eGFR's persistently <60 mL/min signify possible Chronic Kidney Disease.    Anion gap 6 5 - 15  CBC     Status: Abnormal   Collection Time: 01/16/16  6:09 AM  Result Value Ref Range   WBC 10.3 4.0 - 10.5 K/uL   RBC 2.19 (L) 4.22 - 5.81 MIL/uL   Hemoglobin 6.5 (LL) 13.0 - 17.0 g/dL    Comment: REPEATED TO VERIFY SPECIMEN  CHECKED FOR CLOTS CRITICAL RESULT CALLED TO, READ BACK BY AND VERIFIED WITH: E ECHI,RN BY KBARR 01/16/16 BY K BARR CALLED AT 0713 01/16/16    HCT 20.6 (L) 39.0 - 52.0 %   MCV 94.1 78.0 - 100.0 fL   MCH 29.7 26.0 - 34.0 pg   MCHC 31.6 30.0 - 36.0 g/dL   RDW 13.5 11.5 - 15.5 %   Platelets 383 150 - 400 K/uL  CBC with Differential/Platelet     Status: Abnormal   Collection Time: 01/16/16  9:14 AM  Result Value Ref Range   WBC 9.7 4.0 - 10.5 K/uL   RBC 2.75 (L) 4.22 - 5.81 MIL/uL   Hemoglobin 8.3 (L) 13.0 - 17.0 g/dL    Comment: REPEATED TO VERIFY RESULTS VERIFIED VIA RECOLLECT    HCT 26.1 (L) 39.0 - 52.0 %   MCV 94.9 78.0 - 100.0 fL   MCH 30.2 26.0 - 34.0 pg   MCHC 31.8 30.0 - 36.0 g/dL   RDW 13.3 11.5 - 15.5 %   Platelets 327 150 - 400 K/uL   Neutrophils Relative % 69 %   Neutro Abs 6.7 1.7 - 7.7 K/uL   Lymphocytes Relative 21 %   Lymphs Abs 2.0 0.7 - 4.0 K/uL   Monocytes Relative 7 %   Monocytes Absolute 0.7 0.1 - 1.0 K/uL   Eosinophils Relative 4 %   Eosinophils Absolute 0.4 0.0 - 0.7 K/uL   Basophils Relative 0 %   Basophils Absolute 0.0 0.0 - 0.1 K/uL  Save smear     Status: None   Collection Time: 01/16/16  9:14 AM  Result Value Ref Range   Smear Review SMEAR STAINED AND AVAILABLE FOR REVIEW   Lactate dehydrogenase     Status: Abnormal   Collection Time: 01/16/16  9:14 AM  Result Value Ref Range   LDH 265 (H) 98 - 192 U/L  Fibrinogen     Status: Abnormal   Collection Time: 01/16/16  9:14 AM  Result Value Ref Range   Fibrinogen 759 (H) 210 - 475 mg/dL  Protime-INR     Status: None   Collection Time: 01/16/16  9:14 AM  Result Value Ref Range   Prothrombin Time 13.8 11.4 - 15.2 seconds   INR 1.06   Direct antiglobulin test (not at [facility])     Status: None   Collection Time: 01/16/16  9:14 AM  Result Value Ref Range   DAT, complement NEG    DAT, IgG NEG   Reticulocytes     Status: Abnormal   Collection Time: 01/16/16  9:14 AM  Result Value Ref Range  Retic Ct  Pct 2.1 0.4 - 3.1 %   RBC. 2.75 (L) 4.22 - 5.81 MIL/uL   Retic Count, Manual 57.8 19.0 - 186.0 K/uL  Glucose, capillary     Status: Abnormal   Collection Time: 01/16/16 11:34 AM  Result Value Ref Range   Glucose-Capillary 141 (H) 65 - 99 mg/dL   Comment 1 Notify RN    @BRIEFLABTABLE (sdes,specrequest,cult,reptstatus)   ) Recent Results (from the past 720 hour(s))  MRSA PCR Screening     Status: None   Collection Time: 12/18/15 10:43 PM  Result Value Ref Range Status   MRSA by PCR NEGATIVE NEGATIVE Final    Comment:        The GeneXpert MRSA Assay (FDA approved for NASAL specimens only), is one component of a comprehensive MRSA colonization surveillance program. It is not intended to diagnose MRSA infection nor to guide or monitor treatment for MRSA infections.   Surgical pcr screen     Status: None   Collection Time: 12/19/15  7:03 AM  Result Value Ref Range Status   MRSA, PCR NEGATIVE NEGATIVE Final   Staphylococcus aureus NEGATIVE NEGATIVE Final    Comment:        The Xpert SA Assay (FDA approved for NASAL specimens in patients over 18 years of age), is one component of a comprehensive surveillance program.  Test performance has been validated by [facility] for patients greater than or equal to 75 year old. It is not intended to diagnose infection nor to guide or monitor treatment.   Culture, blood (Routine X 2) w Reflex to ID Panel     Status: None (Preliminary result)   Collection Time: 01/12/16  2:45 AM  Result Value Ref Range Status   Specimen Description BLOOD RIGHT ARM  Final   Special Requests BOTTLES DRAWN AEROBIC AND ANAEROBIC 10ML  Final   Culture NO GROWTH 4 DAYS  Final   Report Status PENDING  Incomplete  Culture, blood (Routine X 2) w Reflex to ID Panel     Status: None (Preliminary result)   Collection Time: 01/12/16  2:55 AM  Result Value Ref Range Status   Specimen Description BLOOD RIGHT ARM  Final   Special Requests IN PEDIATRIC BOTTLE 4ML   Final   Culture NO GROWTH 4 DAYS  Final   Report Status PENDING  Incomplete  Culture, Urine     Status: None   Collection Time: 01/12/16  5:56 PM  Result Value Ref Range Status   Specimen Description URINE, RANDOM  Final   Special Requests NONE  Final   Culture NO GROWTH  Final   Report Status 01/13/2016 FINAL  Final     Impression/Recommendation  Principal Problem:   Cellulitis of right ankle Active Problems:   Essential hypertension   Diabetic foot infection (HCC)   Cellulitis and abscess, left foot   Type 2 diabetes mellitus with vascular disease (HCC)   Cellulitis of left ankle   Shane [name] is a 45 y.o. male with  Diabetic foot infection and progression of his soft tissue infection despite appropriate broad spectrum antimicrobials  #1 Worsening DFU:  The most critical issue is to re-engage Dr. Sharol Given and I think this patient will need debridement in the OR  I will change his vancomycin to zyvox to engage the toxin inhibiting properties of this abx at level of the ribosome  I VERY much doubt that he requires pseudomonal coverage and we could very likely change back from zosyn to unasyn soon  I hope we can get some preoperative culture to help guide therapy.  #2 screening he has been screen multiple times for HIV able check for viral hepatitides.     01/16/2016, 3:59 PM   Thank you so much for this interesting consult  [place] [phone] (pager) [phone] (office) 01/16/2016, 3:59 PM  Rhina [place] 01/16/2016, 3:59 PM

## 2016-01-16 NOTE — Progress Notes (Signed)
PROGRESS NOTE  Shane [name]  [id number] DOB: 26-Feb-1971 DOA: 01/11/2016 PCP: Minerva Ends, MD  Brief Narrative:   Shane [name] is a 45 y.o. male with medical history significant of DM2.  Patient recently hospitalized at the start of this month for cellulitis and abscess of the left foot and had an open wound that was treated with wound vac to dorsal surface.  Seeing Dr. Marlou Sa and Dr. Sharol Given.  He was discharged on Doxycyline.  He reports increased ulceration of the distal portion of the ulcer which previously was flush with the surface of his skin, but no increased erythema, induration, odor, or discharge.  He presented due to increased erythema of the right lower extremity which he had injured on his wheelchair.    Assessment & Plan:   Principal Problem:   Cellulitis of right ankle Active Problems:   Essential hypertension   Diabetic foot infection (HCC)   Cellulitis and abscess, left foot   Type 2 diabetes mellitus with vascular disease (HCC)   Cellulitis of left ankle   Diabetic ulcer of left foot associated with type 2 diabetes mellitus (HCC)   Swelling of foot joint  Cellulitis of the right ankle.  Erythema, pain and swelling dramatically worse over the last 24-48h.  Toe nail fell off on 2nd digit.   -  ID consult appreciated:  Vancomycin changed to zyvox, continued zosyn  -  Continue florastor -  Elevate extremity -  Increase to 2 tabs hydrocodone for pain -  Increase gabapentin -  Notified Dr. Sharol Given of worsening condition: will reevaluate on 8/2 -  MRI 7/30 demonstrated some subcutaneous air (may have been introduced by needle), but no fluid collection   Left foot ulcer appears stable.  -  X-ray negative for bony involvement or subcutaneous air -  Continue vancomycin for now as may need surgical debridement of the right foot  Hypertension, blood pressures elevated, likely due to pain -  Control pain -  Continue carvedilol and lisinopril  Diabetes mellitus type 2,  CBG better controlled.  Diabetic neuropathy -  Hold metformin and glipizide -  Continue lantus to 25 units -  Continue standing aspart 5 units with meals -  Continue SSI  GERD, stable, continue PPI  Depression/anxiety, stable, continue fluoxetine  Urinary frequency, may be due to elevated CBG causing osmotic diuresis.  UA negative for infection but demonstrates copious glucosuria.    Normocytic anemia, likely due to chronic disease.  Iron studies suggest possible iron deficiency, vitamin b12 329, folate and TSH wnl.  Jehovah's Witness.  Acute drop in hgb this AM.   -  Received feraheme on 7/29 -  Repeat CBC:  Stable, AM labs were spurious -  No evidence of hemolysis or DIC  DVT prophylaxis:  lovenox Code Status:  full Family Communication:  Patient alone Disposition Plan:   Continue IV antibiotics   Consultants:   Dr. Sharol Given, Orthopedics  Infectious disease  Procedures:  none  Antimicrobials:   Vancomycin 7/28 > 8/1  Ceftriaxone and flagyl 7/28  Ancef 7/28 > 7/31  Unasyn 7/31 > 8/1  Zosyn 8/1 >   Linezolid 8/1 >    Subjective:  Foot is hurting a lot more today, oozing more and 2nd toe nail fell off.  Redness continues to spread despite escalation of antibiotics yesterday.  Has been very fatigued.    Objective: Vitals:   01/15/16 1800 01/15/16 2100 01/16/16 0445 01/16/16 1300  BP:  (!) 155/88 (!) 176/92 (!) 149/84  Pulse:  92 84 (!) 104  Resp:  18 18 18   Temp:  98.1 F (36.7 C) 97.7 F (36.5 C) 99.1 F (37.3 C)  TempSrc:  Oral Oral   SpO2:  95% 100% 98%  Weight: 72.1 kg (158 lb 15.9 oz)     Height: 5\' 8"  (1.727 m)       Intake/Output Summary (Last 24 hours) at 01/16/16 1954 Last data filed at 01/16/16 1700  Gross per 24 hour  Intake             1450 ml  Output              280 ml  Net             1170 ml   Filed Weights   01/15/16 1800  Weight: 72.1 kg (158 lb 15.9 oz)    Examination:  General exam:  Adult male.  No acute distress.  HEENT:   NCAT, MMM Respiratory system: Clear to auscultation bilaterally Cardiovascular system: Regular rate and rhythm, normal S1/S2. No murmurs, rubs, gallops or clicks.  Warm extremities Gastrointestinal system: Normal active bowel sounds, soft, nondistended, nontender. MSK:  RIGHT:  Erythema along lateral and anterior ankle, over dorsum of foot, extending down first and second and third toes is new compared to two days ago.  Top of foot, skin is sloughing and oozing serosanguinous fluid.  2nd toe nail fell off and is oozing blood.  LEFT:  5cm dorsum ulcer appears to have healthy granulation tissue proximally and some slough along the distal portion Neuro:  Grossly intact    Data Reviewed: I have personally reviewed following labs and imaging studies  CBC:  Recent Labs Lab 01/12/16 0110 01/13/16 0252 01/13/16 1156 01/15/16 0322 01/16/16 0609 01/16/16 0914  WBC 9.1 7.3 9.9 8.1 10.3 9.7  NEUTROABS 6.2  --   --   --   --  6.7  HGB 9.3* 7.7* 8.9* 8.0* 6.5* 8.3*  HCT 28.3* 24.5* 28.2* 24.8* 20.6* 26.1*  MCV 93.4 94.2 94.9 93.9 94.1 94.9  PLT 273 256 293 274 383 [id number]   Basic Metabolic Panel:  Recent Labs Lab 01/12/16 0110 01/15/16 0322 01/16/16 0609  NA 139 138 141  K 4.9 4.3 4.3  CL 105 103 107  CO2 26 28 28   GLUCOSE 288* 225* 114*  BUN 16 17 20   CREATININE 1.12 1.07 1.04  CALCIUM 9.2 8.9 9.1   GFR: Estimated Creatinine Clearance: 85.9 mL/min (by C-G formula based on SCr of 1.04 mg/dL). Liver Function Tests: No results for input(s): AST, ALT, ALKPHOS, BILITOT, PROT, ALBUMIN in the last 168 hours. No results for input(s): LIPASE, AMYLASE in the last 168 hours. No results for input(s): AMMONIA in the last 168 hours. Coagulation Profile:  Recent Labs Lab 01/16/16 0914  INR 1.06   Cardiac Enzymes: No results for input(s): CKTOTAL, CKMB, CKMBINDEX, TROPONINI in the last 168 hours. BNP (last 3 results) No results for input(s): PROBNP in the last 8760 hours. HbA1C: No  results for input(s): HGBA1C in the last 72 hours. CBG:  Recent Labs Lab 01/15/16 1706 01/15/16 2255 01/16/16 0550 01/16/16 1134 01/16/16 1609  GLUCAP 129* 264* 120* 141* 186*   Lipid Profile: No results for input(s): CHOL, HDL, LDLCALC, TRIG, CHOLHDL, LDLDIRECT in the last 72 hours. Thyroid Function Tests: No results for input(s): TSH, T4TOTAL, FREET4, T3FREE, THYROIDAB in the last 72 hours. Anemia Panel:  Recent Labs  01/16/16 0914  RETICCTPCT 2.1   Urine analysis:  Component Value Date/Time   COLORURINE YELLOW 01/12/2016 1756   APPEARANCEUR CLEAR 01/12/2016 1756   LABSPEC 1.022 01/12/2016 1756   PHURINE 6.0 01/12/2016 1756   GLUCOSEU >1000 (A) 01/12/2016 1756   HGBUR NEGATIVE 01/12/2016 1756   BILIRUBINUR NEGATIVE 01/12/2016 1756   KETONESUR NEGATIVE 01/12/2016 1756   PROTEINUR 30 (A) 01/12/2016 1756   UROBILINOGEN 0.2 04/24/2015 1056   NITRITE NEGATIVE 01/12/2016 1756   LEUKOCYTESUR NEGATIVE 01/12/2016 1756   Sepsis Labs: @LABRCNTIP (procalcitonin:4,lacticidven:4)  ) Recent Results (from the past 240 hour(s))  Culture, blood (Routine X 2) w Reflex to ID Panel     Status: None (Preliminary result)   Collection Time: 01/12/16  2:45 AM  Result Value Ref Range Status   Specimen Description BLOOD RIGHT ARM  Final   Special Requests BOTTLES DRAWN AEROBIC AND ANAEROBIC 10ML  Final   Culture NO GROWTH 4 DAYS  Final   Report Status PENDING  Incomplete  Culture, blood (Routine X 2) w Reflex to ID Panel     Status: None (Preliminary result)   Collection Time: 01/12/16  2:55 AM  Result Value Ref Range Status   Specimen Description BLOOD RIGHT ARM  Final   Special Requests IN PEDIATRIC BOTTLE 4ML  Final   Culture NO GROWTH 4 DAYS  Final   Report Status PENDING  Incomplete  Culture, Urine     Status: None   Collection Time: 01/12/16  5:56 PM  Result Value Ref Range Status   Specimen Description URINE, RANDOM  Final   Special Requests NONE  Final   Culture NO  GROWTH  Final   Report Status 01/13/2016 FINAL  Final      Radiology Studies: No results found.   Scheduled Meds: . atorvastatin  80 mg Oral q1800  . carvedilol  12.5 mg Oral Q breakfast  . divalproex  1,000 mg Oral QHS  . feeding supplement (GLUCERNA SHAKE)  237 mL Oral BID BM  . ferrous sulfate  325 mg Oral Q breakfast  . gabapentin  200 mg Oral TID  . insulin aspart  0-15 Units Subcutaneous TID WC  . insulin aspart  5 Units Subcutaneous TID WC  . insulin glargine  25 Units Subcutaneous Q2200  . linezolid (ZYVOX) IV  600 mg Intravenous Q12H  . lisinopril  40 mg Oral Daily  . multivitamin with minerals  1 tablet Oral Daily  . mupirocin ointment   Topical Daily  . pantoprazole  40 mg Oral Daily  . piperacillin-tazobactam (ZOSYN)  IV  3.375 g Intravenous Q8H  . polyethylene glycol  17 g Oral Daily  . saccharomyces boulardii  250 mg Oral BID  . senna  2 tablet Oral QHS   Continuous Infusions:    LOS: 4 days    Time spent: 30 min    Janece Canterbury, MD [facility] Pager [phone]  If 7PM-7AM, please contact night-coverage [email or web address] Password [facility] 01/16/2016, 7:54 PM

## 2016-01-16 NOTE — Progress Notes (Signed)
Physician at bedside; updated Short on pt status this morning and critical lab value.

## 2016-01-17 ENCOUNTER — Inpatient Hospital Stay (HOSPITAL_COMMUNITY): Payer: Medicaid Other | Admitting: Anesthesiology

## 2016-01-17 ENCOUNTER — Encounter (HOSPITAL_COMMUNITY)
Admission: EM | Disposition: A | Discharge status: Home Health | Payer: Self-pay | Source: Home / Self Care | Attending: Internal Medicine

## 2016-01-17 HISTORY — PX: INCISION AND DRAINAGE FOOT: SHX1800

## 2016-01-17 HISTORY — PX: I & D EXTREMITY: SHX5045

## 2016-01-17 LAB — CULTURE, BLOOD (ROUTINE X 2)
Culture: NO GROWTH
Culture: NO GROWTH

## 2016-01-17 LAB — CBC
HCT: 23.7 % — ABNORMAL LOW (ref 39.0–52.0)
Hemoglobin: 7.3 g/dL — ABNORMAL LOW (ref 13.0–17.0)
MCH: 29.4 pg (ref 26.0–34.0)
MCHC: 30.8 g/dL (ref 30.0–36.0)
MCV: 95.6 fL (ref 78.0–100.0)
PLATELETS: 340 10*3/uL (ref 150–400)
RBC: 2.48 MIL/uL — AB (ref 4.22–5.81)
RDW: 13.6 % (ref 11.5–15.5)
WBC: 10.1 10*3/uL (ref 4.0–10.5)

## 2016-01-17 LAB — BASIC METABOLIC PANEL
Anion gap: 6 (ref 5–15)
BUN: 21 mg/dL — AB (ref 6–20)
CALCIUM: 8.4 mg/dL — AB (ref 8.9–10.3)
CHLORIDE: 103 mmol/L (ref 101–111)
CO2: 29 mmol/L (ref 22–32)
CREATININE: 1.21 mg/dL (ref 0.61–1.24)
Glucose, Bld: 150 mg/dL — ABNORMAL HIGH (ref 65–99)
POTASSIUM: 4.1 mmol/L (ref 3.5–5.1)
Sodium: 138 mmol/L (ref 135–145)

## 2016-01-17 LAB — GLUCOSE, CAPILLARY
GLUCOSE-CAPILLARY: 137 mg/dL — AB (ref 65–99)
Glucose-Capillary: 155 mg/dL — ABNORMAL HIGH (ref 65–99)
Glucose-Capillary: 211 mg/dL — ABNORMAL HIGH (ref 65–99)
Glucose-Capillary: 324 mg/dL — ABNORMAL HIGH (ref 65–99)

## 2016-01-17 LAB — HAPTOGLOBIN: HAPTOGLOBIN: 220 mg/dL — AB (ref 34–200)

## 2016-01-17 LAB — SURGICAL PCR SCREEN
MRSA, PCR: NEGATIVE
STAPHYLOCOCCUS AUREUS: POSITIVE — AB

## 2016-01-17 SURGERY — IRRIGATION AND DEBRIDEMENT EXTREMITY
Anesthesia: General | Laterality: Right

## 2016-01-17 MED ORDER — METOCLOPRAMIDE HCL 5 MG PO TABS: 5.0000 mg | ORAL_TABLET | Freq: Three times a day (TID) | ORAL | Status: DC | PRN

## 2016-01-17 MED ORDER — ONDANSETRON HCL 4 MG/2ML IJ SOLN
4.0000 mg | Freq: Four times a day (QID) | INTRAMUSCULAR | Status: DC | PRN
  Administered 2016-01-19: 4 mg via INTRAVENOUS
  Filled 2016-01-17: qty 2

## 2016-01-17 MED ORDER — PROPOFOL 10 MG/ML IV BOLUS
INTRAVENOUS | Status: AC
  Filled 2016-01-17: qty 20

## 2016-01-17 MED ORDER — ACETAMINOPHEN 325 MG PO TABS
650.0000 mg | ORAL_TABLET | Freq: Four times a day (QID) | ORAL | Status: DC | PRN
Start: 2016-01-17 — End: 2016-01-23
  Administered 2016-01-20: 650 mg via ORAL
  Filled 2016-01-17: qty 2

## 2016-01-17 MED ORDER — METHOCARBAMOL 500 MG PO TABS
500.0000 mg | ORAL_TABLET | Freq: Four times a day (QID) | ORAL | Status: DC | PRN
  Administered 2016-01-18 – 2016-01-21 (×4): 500 mg via ORAL
  Filled 2016-01-17 (×5): qty 1

## 2016-01-17 MED ORDER — ROCURONIUM BROMIDE 50 MG/5ML IV SOLN
INTRAVENOUS | Status: AC
  Filled 2016-01-17: qty 1

## 2016-01-17 MED ORDER — SODIUM CHLORIDE 0.9 % IV SOLN: INTRAVENOUS | Status: DC

## 2016-01-17 MED ORDER — ONDANSETRON HCL 4 MG/2ML IJ SOLN
INTRAMUSCULAR | Status: AC
  Filled 2016-01-17: qty 4

## 2016-01-17 MED ORDER — LIDOCAINE HCL (CARDIAC) 20 MG/ML IV SOLN
INTRAVENOUS | Status: DC | PRN
  Administered 2016-01-17: 60 mg via INTRATRACHEAL

## 2016-01-17 MED ORDER — LIDOCAINE 2% (20 MG/ML) 5 ML SYRINGE
INTRAMUSCULAR | Status: AC
  Filled 2016-01-17: qty 10

## 2016-01-17 MED ORDER — PHENYLEPHRINE 40 MCG/ML (10ML) SYRINGE FOR IV PUSH (FOR BLOOD PRESSURE SUPPORT)
PREFILLED_SYRINGE | INTRAVENOUS | Status: AC
  Filled 2016-01-17: qty 10

## 2016-01-17 MED ORDER — SODIUM CHLORIDE 0.9 % IR SOLN
Status: DC | PRN
  Administered 2016-01-17: 3000 mL

## 2016-01-17 MED ORDER — MUPIROCIN 2 % EX OINT
TOPICAL_OINTMENT | Freq: Two times a day (BID) | CUTANEOUS | Status: DC
  Administered 2016-01-17 – 2016-01-23 (×10): via NASAL
  Filled 2016-01-17: qty 22

## 2016-01-17 MED ORDER — ONDANSETRON HCL 4 MG/2ML IJ SOLN
INTRAMUSCULAR | Status: DC | PRN
  Administered 2016-01-17: 4 mg via INTRAVENOUS

## 2016-01-17 MED ORDER — ACETAMINOPHEN 650 MG RE SUPP: 650.0000 mg | Freq: Four times a day (QID) | RECTAL | Status: DC | PRN

## 2016-01-17 MED ORDER — OXYCODONE HCL 5 MG PO TABS
5.0000 mg | ORAL_TABLET | ORAL | Status: DC | PRN
  Administered 2016-01-18: 10 mg via ORAL
  Administered 2016-01-18 (×2): 5 mg via ORAL
  Filled 2016-01-17: qty 1
  Filled 2016-01-17: qty 2
  Filled 2016-01-17: qty 1

## 2016-01-17 MED ORDER — ONDANSETRON HCL 4 MG PO TABS: 4.0000 mg | ORAL_TABLET | Freq: Four times a day (QID) | ORAL | Status: DC | PRN

## 2016-01-17 MED ORDER — PROPOFOL 10 MG/ML IV BOLUS
INTRAVENOUS | Status: DC | PRN
  Administered 2016-01-17: 150 mg via INTRAVENOUS

## 2016-01-17 MED ORDER — 0.9 % SODIUM CHLORIDE (POUR BTL) OPTIME
TOPICAL | Status: DC | PRN
  Administered 2016-01-17: 1000 mL

## 2016-01-17 MED ORDER — DEXTROSE 5 % IV SOLN
500.0000 mg | Freq: Four times a day (QID) | INTRAVENOUS | Status: DC | PRN
  Filled 2016-01-17: qty 5

## 2016-01-17 MED ORDER — FENTANYL CITRATE (PF) 250 MCG/5ML IJ SOLN
INTRAMUSCULAR | Status: AC
  Filled 2016-01-17: qty 5

## 2016-01-17 MED ORDER — METOCLOPRAMIDE HCL 5 MG/ML IJ SOLN: 5.0000 mg | Freq: Three times a day (TID) | INTRAMUSCULAR | Status: DC | PRN

## 2016-01-17 MED ORDER — FENTANYL CITRATE (PF) 250 MCG/5ML IJ SOLN
INTRAMUSCULAR | Status: DC | PRN
  Administered 2016-01-17: 100 ug via INTRAVENOUS

## 2016-01-17 MED ORDER — LACTATED RINGERS IV SOLN
INTRAVENOUS | Status: DC
Start: 2016-01-17 — End: 2016-01-23
  Administered 2016-01-17: 10:00:00 via INTRAVENOUS

## 2016-01-17 SURGICAL SUPPLY — 40 items
BLADE SURG 10 STRL SS (BLADE) IMPLANT
BLADE SURG 21 STRL SS (BLADE) ×3 IMPLANT
BNDG COHESIVE 4X5 TAN STRL (GAUZE/BANDAGES/DRESSINGS) IMPLANT
BNDG COHESIVE 6X5 TAN STRL LF (GAUZE/BANDAGES/DRESSINGS) IMPLANT
BNDG GAUZE ELAST 4 BULKY (GAUZE/BANDAGES/DRESSINGS) ×6 IMPLANT
COVER SURGICAL LIGHT HANDLE (MISCELLANEOUS) ×6 IMPLANT
DRAPE U-SHAPE 47X51 STRL (DRAPES) ×3 IMPLANT
DRSG ADAPTIC 3X8 NADH LF (GAUZE/BANDAGES/DRESSINGS) ×3 IMPLANT
DURAPREP 26ML APPLICATOR (WOUND CARE) ×3 IMPLANT
ELECT CAUTERY BLADE 6.4 (BLADE) IMPLANT
ELECT REM PT RETURN 9FT ADLT (ELECTROSURGICAL)
ELECTRODE REM PT RTRN 9FT ADLT (ELECTROSURGICAL) IMPLANT
GAUZE SPONGE 4X4 12PLY STRL (GAUZE/BANDAGES/DRESSINGS) ×3 IMPLANT
GLOVE BIOGEL PI IND STRL 9 (GLOVE) ×1 IMPLANT
GLOVE BIOGEL PI INDICATOR 9 (GLOVE) ×2
GLOVE SURG ORTHO 9.0 STRL STRW (GLOVE) ×3 IMPLANT
GOWN STRL REUS W/ TWL LRG LVL3 (GOWN DISPOSABLE) ×1 IMPLANT
GOWN STRL REUS W/ TWL XL LVL3 (GOWN DISPOSABLE) ×2 IMPLANT
GOWN STRL REUS W/TWL LRG LVL3 (GOWN DISPOSABLE) ×2
GOWN STRL REUS W/TWL XL LVL3 (GOWN DISPOSABLE) ×4
HANDPIECE INTERPULSE COAX TIP (DISPOSABLE)
KIT BASIN OR (CUSTOM PROCEDURE TRAY) ×3 IMPLANT
KIT ROOM TURNOVER OR (KITS) ×3 IMPLANT
MANIFOLD NEPTUNE II (INSTRUMENTS) ×3 IMPLANT
NS IRRIG 1000ML POUR BTL (IV SOLUTION) ×3 IMPLANT
PACK ORTHO EXTREMITY (CUSTOM PROCEDURE TRAY) ×3 IMPLANT
PAD ARMBOARD 7.5X6 YLW CONV (MISCELLANEOUS) ×6 IMPLANT
PREVENA INCISION MGT 90 150 (MISCELLANEOUS) ×3 IMPLANT
SET HNDPC FAN SPRY TIP SCT (DISPOSABLE) IMPLANT
STOCKINETTE IMPERVIOUS 9X36 MD (GAUZE/BANDAGES/DRESSINGS) IMPLANT
SUT ETHILON 2 0 PSLX (SUTURE) ×3 IMPLANT
SWAB COLLECTION DEVICE MRSA (MISCELLANEOUS) ×3 IMPLANT
TOWEL OR 17X24 6PK STRL BLUE (TOWEL DISPOSABLE) ×3 IMPLANT
TOWEL OR 17X26 10 PK STRL BLUE (TOWEL DISPOSABLE) ×3 IMPLANT
TUBE ANAEROBIC SPECIMEN COL (MISCELLANEOUS) IMPLANT
TUBE CONNECTING 12'X1/4 (SUCTIONS) ×1
TUBE CONNECTING 12X1/4 (SUCTIONS) ×2 IMPLANT
UNDERPAD 30X30 INCONTINENT (UNDERPADS AND DIAPERS) ×3 IMPLANT
WATER STERILE IRR 1000ML POUR (IV SOLUTION) ×3 IMPLANT
YANKAUER SUCT BULB TIP NO VENT (SUCTIONS) ×3 IMPLANT

## 2016-01-17 NOTE — H&P (View-Only) (Signed)
Patient ID: Shane [name], male   DOB: [birth year], 45 y.o.   MRN: [id number] Clinically patient's right foot is getting worse. There is increased blistering dorsally on the foot. We will plan for irrigation and debridement surgery today. Possibly this morning if operating time is available otherwise after 5 PM.

## 2016-01-17 NOTE — Op Note (Signed)
01/11/2016 - 01/17/2016  11:56 AM  PATIENT:  Shane [name]    PRE-OPERATIVE DIAGNOSIS:  right foot abscess  POST-OPERATIVE DIAGNOSIS:  Same  PROCEDURE:  IRRIGATION AND DEBRIDEMENT RIGHT FOOT Application Prevena wound VAC Hematoma sent for cultures  SURGEON:  Newt Minion, MD  PHYSICIAN ASSISTANT:None ANESTHESIA:   General  PREOPERATIVE INDICATIONS:  Tell Demchak is a  45 y.o. male with a diagnosis of right foot abscess who failed conservative measures and elected for surgical management.    The risks benefits and alternatives were discussed with the patient preoperatively including but not limited to the risks of infection, bleeding, nerve injury, cardiopulmonary complications, the need for revision surgery, among others, and the patient was willing to proceed.  OPERATIVE IMPLANTS: Prevena wound VAC  OPERATIVE FINDINGS: Patient had a large hematoma there was no purulence no abscess. Hematoma was sent for cultures.  OPERATIVE PROCEDURE: Patient was brought to the operating room and underwent a general anesthetic. After adequate levels anesthesia obtained patient's right lower extremity was prepped using DuraPrep draped into a sterile field a timeout was called. A dorsal incision was made along the first webspace. Patient had a large hematoma this was evacuated and hematoma sent for cultures. The wound was irrigated with pulsatile lavage. Nonviable soft tissue was removed. Electrocautery was used for hemostasis. There was no acute blood loss. After the entire hematoma was evacuated the wound was closed using 2-0 nylon a Prevena wound VAC was applied set to 75 mm of suction continuous. And anticipate discharge with the wound VAC in place.

## 2016-01-17 NOTE — Anesthesia Preprocedure Evaluation (Addendum)
Anesthesia Evaluation  Patient identified by MRN, date of birth, ID band Patient awake    Airway Mallampati: I  TM Distance: >3 FB Neck ROM: Full    Dental  (+) Teeth Intact   Pulmonary sleep apnea ,    breath sounds clear to auscultation       Cardiovascular hypertension, + Peripheral Vascular Disease and +CHF   Rhythm:Regular Rate:Normal     Neuro/Psych TIA   GI/Hepatic GERD  ,  Endo/Other  diabetes  Renal/GU      Musculoskeletal   Abdominal (+) + obese,   Peds  Hematology   Anesthesia Other Findings   Reproductive/Obstetrics                            Anesthesia Physical Anesthesia Plan  ASA: III  Anesthesia Plan: General   Post-op Pain Management:    Induction: Intravenous  Airway Management Planned: LMA  Additional Equipment:   Intra-op Plan:   Post-operative Plan: Extubation in OR  Informed Consent:   Plan Discussed with: CRNA  Anesthesia Plan Comments:         Anesthesia Quick Evaluation

## 2016-01-17 NOTE — Anesthesia Postprocedure Evaluation (Signed)
Anesthesia Post Note  Patient: Shane [name]  Procedure(s) Performed: Procedure(s) (LRB): IRRIGATION AND DEBRIDEMENT RIGHT FOOT (Right)  Patient location during evaluation: PACU Anesthesia Type: General Level of consciousness: awake and alert Pain management: pain level controlled Vital Signs Assessment: post-procedure vital signs reviewed and stable Respiratory status: spontaneous breathing, nonlabored ventilation, respiratory function stable and patient connected to nasal cannula oxygen Cardiovascular status: blood pressure returned to baseline and stable Postop Assessment: no signs of nausea or vomiting Anesthetic complications: no    Last Vitals:  Vitals:   01/17/16 1230 01/17/16 1245  BP: 112/68 115/70  Pulse: 74 75  Resp: 11 14  Temp:  36.8 C    Last Pain:  Vitals:   01/17/16 1245  TempSrc:   PainSc: 1         RLE Motor Response: Purposeful movement (01/17/16 1245) RLE Sensation: No tingling;No numbness;No pain (01/17/16 1245)      Nissan Frazzini,JAMES TERRILL

## 2016-01-17 NOTE — Progress Notes (Signed)
Rept to Dr Sharol Given and Dr. Karleen Hampshire. Pt's hemoglobin this AM 7.3. Hgb 8/1 8.3. Pt is a Jehovah's Witness. Pt refused blood. No new orders at this time. Will continue to monitor.

## 2016-01-17 NOTE — Progress Notes (Signed)
Nutrition Follow-up  DOCUMENTATION CODES:   Not applicable  INTERVENTION:  Continue Glucerna Shake po BID, each supplement provides 220 kcal and 10 grams of protein.  Encourage adequate PO intake.   NUTRITION DIAGNOSIS:   Increased nutrient needs related to wound healing as evidenced by estimated needs; ongoing  GOAL:   Patient will meet greater than or equal to 90% of their needs; progressing  MONITOR:   PO intake, Supplement acceptance, Labs, Weight trends, Skin, I & O's  REASON FOR ASSESSMENT:   Consult Wound healing  ASSESSMENT:   45 y.o. male with medical history significant of DM2.  Patient recently hospitalized at the start of this month for cellulitis and abscess of the left foot, has open wound that has been being treated with wound vac to dorsal surface. He does report that recently he has been noticing more drainage from the LEFT wound site; however, what really brings him in is that he now has erythema, edema, to the RIGHT foot and ankle on the dorsal surface (contralateral foot)  Procedure (8/2): IRRIGATION AND DEBRIDEMENT RIGHT FOOT (Right)  Pt was in procedure during attempted time of visit. Diet has been advanced. Meal completion since admission has been 50-100% with most intake usually at 100%. Pt has Glucerna Shake ordered and has been consuming them. RD to continue with current orders to aid in wound healing. Labs and medications reviewed.   Diet Order:  Diet Carb Modified Fluid consistency: Thin; Room service appropriate? Yes  Skin:  Wound (see comment) (Incision on L foot with wound VAC)  Last BM:  8/1  Height:   Ht Readings from Last 1 Encounters:  01/15/16 5\' 8"  (1.727 m)    Weight:   Wt Readings from Last 1 Encounters:  01/15/16 158 lb 15.9 oz (72.1 kg)    Ideal Body Weight:  70 kg  BMI:  Body mass index is 24.18 kg/m.  Estimated Nutritional Needs:   Kcal:  2000-2300  Protein:  100-110 grams  Fluid:  2- 2.3 L/day  EDUCATION  NEEDS:   No education needs identified at this time  Corrin Parker, MS, RD, LDN Pager # [phone] After hours/ weekend pager # [phone]

## 2016-01-17 NOTE — Progress Notes (Signed)
Pt transferred to preop holding area for I and D of R foot to be done by Dr. Sharol Given via hospital bed.

## 2016-01-17 NOTE — Progress Notes (Signed)
Rept to [employer] in the preop holding area.

## 2016-01-17 NOTE — Progress Notes (Signed)
PROGRESS NOTE    Shane [name]  [id number] DOB: [birth year] DOA: 01/11/2016 PCP: Minerva Ends, MD    Brief Narrative: Shane [name] a 45 y.o.malewith medical history significant of DM2 admitted for a diabetic right foot ulcer.    Assessment & Plan:   Principal Problem:   Cellulitis of right ankle Active Problems:   Essential hypertension   Chronic anemia   Diabetic foot infection (HCC)   Cellulitis and abscess, left foot   Type 2 diabetes mellitus with vascular disease (HCC)   Cellulitis of left ankle   Diabetic ulcer of left foot associated with type 2 diabetes mellitus (HCC)   Swelling of foot joint   Cellulitis and possible abscess of the right foot: S/p I&D on 8/2, resume IV antibiotics and wound vac, he will need another exploration and debridement on Friday as per Dr Sharol Given.  Pain control.   Left foot ulcer stable.   Hypertension: Better controlled today.  Resume lisinopril and coreg.    Iron deficient/ normocytic anemia: Get iron levels , he is Jehovah's witness and will not take any blood products, but will use IV iron if needed. Continue to monitor hemoglobin.  Discussed the risks of having anemia .    Diabetes mellitus: CBG (last 3)   Recent Labs  01/17/16 0833 01/17/16 1203 01/17/16 1632  GLUCAP 155* 211* 324*    Resume SSI. Continue the lantus of 25 units and novolog TIDAC.    DVT prophylaxis: (Lovenox) Code Status: (Full/ Family Communication: family at bedside.  Disposition Plan: pending further eval.   Consultants:   Orthopedics Dr Sharol Given  ID   Procedures: I&D on 8/2   Antimicrobials:  Vancomycin 7/28 > 8/1  Ceftriaxone and flagyl 7/28  Ancef 7/28 > 7/31  Unasyn 7/31 > 8/1  Zosyn 8/1 >   Linezolid 8/1 >  Subjective: Reports painmeds are wearing off,   Objective: Vitals:   01/17/16 1157 01/17/16 1215 01/17/16 1230 01/17/16 1245  BP: 103/67 111/66 112/68 115/70  Pulse: 77 75 74 75  Resp: 10 11 11 14     Temp: 98.3 F (36.8 C)   98.2 F (36.8 C)  TempSrc:      SpO2: 99% 99% 97% 95%  Weight:      Height:        Intake/Output Summary (Last 24 hours) at 01/17/16 1842 Last data filed at 01/17/16 1157  Gross per 24 hour  Intake              325 ml  Output              460 ml  Net             -135 ml   Filed Weights   01/15/16 1800  Weight: 72.1 kg (158 lb 15.9 oz)    Examination:  General exam: Appears calm and comfortable  Respiratory system: Clear to auscultation. Respiratory effort normal. Cardiovascular system: S1 & S2 heard, RRR. No JVD, murmurs, rubs, gallops or clicks. No pedal edema. Gastrointestinal system: Abdomen is nondistended, soft and nontender. No organomegaly or masses felt. Normal bowel sounds heard. Central nervous system: Alert and oriented. No focal neurological deficits. Extremities: right lower extremity erythematous, s/p I&D , wound vac in place.  Left lower extremity  Ulcer  Psychiatry: Judgement and insight appear normal. Mood & affect appropriate.     Data Reviewed: I have personally reviewed following labs and imaging studies  CBC:  Recent Labs Lab 01/12/16 0110  01/13/16 1156 01/15/16  [id number] 01/16/16 0609 01/16/16 0914 01/17/16 0620  WBC 9.1  < > 9.9 8.1 10.3 9.7 10.1  NEUTROABS 6.2  --   --   --   --  6.7  --   HGB 9.3*  < > 8.9* 8.0* 6.5* 8.3* 7.3*  HCT 28.3*  < > 28.2* 24.8* 20.6* 26.1* 23.7*  MCV 93.4  < > 94.9 93.9 94.1 94.9 95.6  PLT 273  < > 293 274 383 327 340  < > = values in this interval not displayed. Basic Metabolic Panel:  Recent Labs Lab 01/12/16 0110 01/15/16 0322 01/16/16 0609 01/17/16 0620  NA 139 138 141 138  K 4.9 4.3 4.3 4.1  CL 105 103 107 103  CO2 26 28 28 29   GLUCOSE 288* 225* 114* 150*  BUN 16 17 20  21*  CREATININE 1.12 1.07 1.04 1.21  CALCIUM 9.2 8.9 9.1 8.4*   GFR: Estimated Creatinine Clearance: 73.8 mL/min (by C-G formula based on SCr of 1.21 mg/dL). Liver Function Tests: No results for  input(s): AST, ALT, ALKPHOS, BILITOT, PROT, ALBUMIN in the last 168 hours. No results for input(s): LIPASE, AMYLASE in the last 168 hours. No results for input(s): AMMONIA in the last 168 hours. Coagulation Profile:  Recent Labs Lab 01/16/16 0914  INR 1.06   Cardiac Enzymes: No results for input(s): CKTOTAL, CKMB, CKMBINDEX, TROPONINI in the last 168 hours. BNP (last 3 results) No results for input(s): PROBNP in the last 8760 hours. HbA1C: No results for input(s): HGBA1C in the last 72 hours. CBG:  Recent Labs Lab 01/16/16 2136 01/17/16 0638 01/17/16 0833 01/17/16 1203 01/17/16 1632  GLUCAP 209* 137* 155* 211* 324*   Lipid Profile: No results for input(s): CHOL, HDL, LDLCALC, TRIG, CHOLHDL, LDLDIRECT in the last 72 hours. Thyroid Function Tests: No results for input(s): TSH, T4TOTAL, FREET4, T3FREE, THYROIDAB in the last 72 hours. Anemia Panel:  Recent Labs  01/16/16 0914  RETICCTPCT 2.1   Sepsis Labs:  Recent Labs Lab 01/12/16 0256  LATICACIDVEN 1.14    Recent Results (from the past 240 hour(s))  Culture, blood (Routine X 2) w Reflex to ID Panel     Status: None   Collection Time: 01/12/16  2:45 AM  Result Value Ref Range Status   Specimen Description BLOOD RIGHT ARM  Final   Special Requests BOTTLES DRAWN AEROBIC AND ANAEROBIC 10ML  Final   Culture NO GROWTH 5 DAYS  Final   Report Status 01/17/2016 FINAL  Final  Culture, blood (Routine X 2) w Reflex to ID Panel     Status: None   Collection Time: 01/12/16  2:55 AM  Result Value Ref Range Status   Specimen Description BLOOD RIGHT ARM  Final   Special Requests IN PEDIATRIC BOTTLE 4ML  Final   Culture NO GROWTH 5 DAYS  Final   Report Status 01/17/2016 FINAL  Final  Culture, Urine     Status: None   Collection Time: 01/12/16  5:56 PM  Result Value Ref Range Status   Specimen Description URINE, RANDOM  Final   Special Requests NONE  Final   Culture NO GROWTH  Final   Report Status 01/13/2016 FINAL   Final  Surgical pcr screen     Status: Abnormal   Collection Time: 01/16/16 11:43 PM  Result Value Ref Range Status   MRSA, PCR NEGATIVE NEGATIVE Final   Staphylococcus aureus POSITIVE (A) NEGATIVE Final    Comment:        The Xpert SA Assay (FDA  approved for NASAL specimens in patients over 21 years of age), is one component of a comprehensive surveillance program.  Test performance has been validated by [facility] for patients greater than or equal to 21 year old. It is not intended to diagnose infection nor to guide or monitor treatment.   Aerobic/Anaerobic Culture (surgical/deep wound)     Status: None (Preliminary result)   Collection Time: 01/17/16 11:26 AM  Result Value Ref Range Status   Specimen Description ABSCESS RIGHT FOOT  Final   Special Requests POF ZOSYN  Final   Gram Stain   Final    MODERATE WBC PRESENT,BOTH PMN AND MONONUCLEAR NO ORGANISMS SEEN    Culture PENDING  Incomplete   Report Status PENDING  Incomplete         Radiology Studies: No results found.      Scheduled Meds: . atorvastatin  80 mg Oral q1800  . carvedilol  12.5 mg Oral Q breakfast  . divalproex  1,000 mg Oral QHS  . enoxaparin (LOVENOX) injection  40 mg Subcutaneous Q24H  . feeding supplement (GLUCERNA SHAKE)  237 mL Oral BID BM  . ferrous sulfate  325 mg Oral Q breakfast  . gabapentin  200 mg Oral TID  . ibuprofen  400 mg Oral TID  . insulin aspart  0-15 Units Subcutaneous TID WC  . insulin aspart  5 Units Subcutaneous TID WC  . insulin glargine  25 Units Subcutaneous Q2200  . linezolid (ZYVOX) IV  600 mg Intravenous Q12H  . lisinopril  40 mg Oral Daily  . multivitamin with minerals  1 tablet Oral Daily  . mupirocin ointment   Topical Daily  . mupirocin ointment   Nasal BID  . pantoprazole  40 mg Oral Daily  . piperacillin-tazobactam (ZOSYN)  IV  3.375 g Intravenous Q8H  . polyethylene glycol  17 g Oral Daily  . saccharomyces boulardii  250 mg Oral BID  . senna  2  tablet Oral QHS   Continuous Infusions: . lactated ringers 10 mL/hr at 01/17/16 0940     LOS: 5 days    Time spent: 30 minutes.     Hosie Poisson, MD [facility] Pager [phone]  If 7PM-7AM, please contact night-coverage [email or web address] Password [facility] 01/17/2016, 6:42 PM

## 2016-01-17 NOTE — Transfer of Care (Signed)
Immediate Anesthesia Transfer of Care Note  Patient: Verden Donini  Procedure(s) Performed: Procedure(s): IRRIGATION AND DEBRIDEMENT RIGHT FOOT (Right)  Patient Location: PACU  Anesthesia Type:General  Level of Consciousness: awake, alert  and oriented  Airway & Oxygen Therapy: Patient Spontanous Breathing and Patient connected to nasal cannula oxygen  Post-op Assessment: Report given to RN, Post -op Vital signs reviewed and stable and Patient moving all extremities X 4  Post vital signs: Reviewed and stable  Last Vitals:  Vitals:   01/17/16 0834 01/17/16 1157  BP: (!) 161/83   Pulse: 92 77  Resp: 20   Temp: 37 C 36.8 C    Last Pain:  Vitals:   01/17/16 0834  TempSrc: Oral  PainSc:       Patients Stated Pain Goal: 0 ([id number])  Complications: No apparent anesthesia complications

## 2016-01-17 NOTE — Anesthesia Procedure Notes (Signed)
Procedure Name: LMA Insertion Date/Time: 01/17/2016 11:14 AM Performed by: Mariea Clonts Pre-anesthesia Checklist: Patient identified, Emergency Drugs available, Suction available, Patient being monitored and Timeout performed Patient Re-evaluated:Patient Re-evaluated prior to inductionOxygen Delivery Method: Circle system utilized Preoxygenation: Pre-oxygenation with 100% oxygen Intubation Type: IV induction LMA: LMA inserted LMA Size: 5.0 Placement Confirmation: positive ETCO2 and breath sounds checked- equal and bilateral Tube secured with: Tape Dental Injury: Teeth and Oropharynx as per pre-operative assessment

## 2016-01-17 NOTE — Progress Notes (Signed)
Patient ID: Shane [name], male   DOB: [birth year], 45 y.o.   MRN: [id number] Clinically patient's right foot is getting worse. There is increased blistering dorsally on the foot. We will plan for irrigation and debridement surgery today. Possibly this morning if operating time is available otherwise after 5 PM.

## 2016-01-17 NOTE — Plan of Care (Signed)
Problem: Safety: Goal: Ability to remain free from injury will improve Outcome: Progressing No fall or injury noted, this shift, fall preventions and safety precautions maintaind  Problem: Physical Regulation: Goal: Ability to maintain clinical measurements within normal limits will improve Outcome: Progressing Patient is very weak and has difficulty moving in the bed Goal: Will remain free from infection Outcome: Progressing No S/S of infection noted, VS WNL  Problem: Skin Integrity: Goal: Risk for impaired skin integrity will decrease Outcome: Progressing Both feet with wounds, left foot with a wound on the top, wound drained moderate amount of serosanguinous drainage, wound cleaned with NS and covered with Tela and ACE wrap, tissues look beefy red, no odor noted  Problem: Bowel/Gastric: Goal: Will not experience complications related to bowel motility No bowel issues reported

## 2016-01-17 NOTE — Interval H&P Note (Signed)
History and Physical Interval Note:  01/17/2016 6:54 AM  Shane [name]  has presented today for surgery, with the diagnosis of right foot abscess  The various methods of treatment have been discussed with the patient and family. After consideration of risks, benefits and other options for treatment, the patient has consented to  Procedure(s): IRRIGATION AND DEBRIDEMENT RIGHT FOOT (Right) as a surgical intervention .  The patient's history has been reviewed, patient examined, no change in status, stable for surgery.  I have reviewed the patient's chart and labs.  Questions were answered to the patient's satisfaction.     Darron Stuck V

## 2016-01-18 ENCOUNTER — Encounter (HOSPITAL_COMMUNITY): Payer: Self-pay | Admitting: General Practice

## 2016-01-18 ENCOUNTER — Inpatient Hospital Stay (HOSPITAL_COMMUNITY): Payer: Medicaid Other

## 2016-01-18 DIAGNOSIS — K921 Melena: Secondary | ICD-10-CM

## 2016-01-18 DIAGNOSIS — D509 Iron deficiency anemia, unspecified: Secondary | ICD-10-CM

## 2016-01-18 DIAGNOSIS — L089 Local infection of the skin and subcutaneous tissue, unspecified: Secondary | ICD-10-CM

## 2016-01-18 DIAGNOSIS — E1169 Type 2 diabetes mellitus with other specified complication: Secondary | ICD-10-CM

## 2016-01-18 LAB — HEPATITIS PANEL, ACUTE
HEP A IGM: NEGATIVE
HEP B C IGM: NEGATIVE
Hepatitis B Surface Ag: NEGATIVE

## 2016-01-18 LAB — GLUCOSE, CAPILLARY
GLUCOSE-CAPILLARY: 143 mg/dL — AB (ref 65–99)
GLUCOSE-CAPILLARY: 150 mg/dL — AB (ref 65–99)
Glucose-Capillary: 122 mg/dL — ABNORMAL HIGH (ref 65–99)
Glucose-Capillary: 126 mg/dL — ABNORMAL HIGH (ref 65–99)
Glucose-Capillary: 230 mg/dL — ABNORMAL HIGH (ref 65–99)

## 2016-01-18 LAB — CBC
HCT: 22.8 % — ABNORMAL LOW (ref 39.0–52.0)
Hemoglobin: 7.1 g/dL — ABNORMAL LOW (ref 13.0–17.0)
MCH: 30 pg (ref 26.0–34.0)
MCHC: 31.1 g/dL (ref 30.0–36.0)
MCV: 96.2 fL (ref 78.0–100.0)
Platelets: 323 10*3/uL (ref 150–400)
RBC: 2.37 MIL/uL — ABNORMAL LOW (ref 4.22–5.81)
RDW: 13.9 % (ref 11.5–15.5)
WBC: 9.6 10*3/uL (ref 4.0–10.5)

## 2016-01-18 LAB — BASIC METABOLIC PANEL WITH GFR
Anion gap: 7 (ref 5–15)
BUN: 20 mg/dL (ref 6–20)
CO2: 29 mmol/L (ref 22–32)
Calcium: 8.4 mg/dL — ABNORMAL LOW (ref 8.9–10.3)
Chloride: 104 mmol/L (ref 101–111)
Creatinine, Ser: 1.07 mg/dL (ref 0.61–1.24)
GFR calc Af Amer: >60 mL/min
GFR calc non Af Amer: >60 mL/min
Glucose, Bld: 130 mg/dL — ABNORMAL HIGH (ref 65–99)
Potassium: 4.7 mmol/L (ref 3.5–5.1)
Sodium: 140 mmol/L (ref 135–145)

## 2016-01-18 MED ORDER — MORPHINE SULFATE (PF) 2 MG/ML IV SOLN
0.5000 mg | Freq: Once | INTRAVENOUS | Status: AC
  Administered 2016-01-19: 0.5 mg via INTRAVENOUS
  Filled 2016-01-18: qty 1

## 2016-01-18 MED ORDER — GLUCERNA SHAKE PO LIQD
237.0000 mL | Freq: Two times a day (BID) | ORAL | Status: DC
  Administered 2016-01-23: 237 mL via ORAL

## 2016-01-18 MED ORDER — ENOXAPARIN SODIUM 40 MG/0.4ML ~~LOC~~ SOLN: 40.0000 mg | SUBCUTANEOUS | Status: DC

## 2016-01-18 MED ORDER — HYDROCODONE-ACETAMINOPHEN 5-325 MG PO TABS
1.0000 | ORAL_TABLET | ORAL | Status: DC | PRN
  Administered 2016-01-18 – 2016-01-19 (×3): 2 via ORAL
  Filled 2016-01-18 (×3): qty 2

## 2016-01-18 MED ORDER — PEG-KCL-NACL-NASULF-NA ASC-C 100 G PO SOLR
0.5000 | Freq: Once | ORAL | Status: AC
  Administered 2016-01-19: 100 g via ORAL
  Filled 2016-01-18: qty 1

## 2016-01-18 MED ORDER — LABETALOL HCL 5 MG/ML IV SOLN
5.0000 mg | INTRAVENOUS | Status: DC | PRN
  Administered 2016-01-19 – 2016-01-22 (×3): 5 mg via INTRAVENOUS
  Filled 2016-01-18 (×8): qty 4

## 2016-01-18 MED ORDER — PEG-KCL-NACL-NASULF-NA ASC-C 100 G PO SOLR: 1.0000 | Freq: Once | ORAL | Status: DC

## 2016-01-18 MED ORDER — PEG-KCL-NACL-NASULF-NA ASC-C 100 G PO SOLR
0.5000 | Freq: Once | ORAL | Status: AC
  Administered 2016-01-18: 100 g via ORAL
  Filled 2016-01-18: qty 1

## 2016-01-18 NOTE — Progress Notes (Signed)
Test paged [facility] regarding pt having blurry vision, faintness, and slightly less chest pressure. BP 155/97. Awaiting return call and/or orders.

## 2016-01-18 NOTE — Progress Notes (Signed)
Rapid Response RN at bedside.  

## 2016-01-18 NOTE — Progress Notes (Signed)
Patient ID: Shane [name], male   DOB: February 09, 1971, 45 y.o.   MRN: [id number] Postoperative day 1 status post irrigation and debridement abscess dorsum of the right foot. Patient had a large hematoma there was no abscess no purulence. The hematoma was sent for cultures. Postoperatively patient has 150 mL in the wound VAC canister. Patient can be discharged with the incisional wound VAC with the portable pop. I will follow-up in the office in 1 week. Do not see an indication for returning to the operating room at this time.

## 2016-01-18 NOTE — Progress Notes (Signed)
Pt called to nurses station and stated he thinks his wound vac is bleeding. Upon entry into patient small drops of blood seen on floor. Patient stated he got out of bed and went to the bathroom and then back to bed and that's when he noticed the blood on the floor. Suction cap of tubing was noted to be partially dislodged from the wound vac dressing. The wound vac was turned off to find the source of the leak. The wound suction cap tubing was reinforced with tegaderm dressing. The wound vac was turned back on and seal leakage noted as low with active drainage going into the canister. No active noted around wound vac dressing site. Will continue to monitor for signs leakage around the wound vac site.

## 2016-01-18 NOTE — Progress Notes (Signed)
Charge RN called [facility] regarding inability to reach [facility] oncall. Attemped secondary avenue of contacting medical provider through [facility]. Awaiting response at this time.   Pt currently watching tv and rating chest pressure/tightness 5/10. Still c/o blurred vision and HA. BP 162/93.  Will continue to assess.

## 2016-01-18 NOTE — Evaluation (Signed)
Physical Therapy Evaluation Patient Details Name: Shane [name] MRN: [id number] DOB: Dec 11, 1970 Today's Date: 01/18/2016   History of Present Illness  45 y.o. male now s/p status post I&D abscess dorsum of the right foot on 01/18/16. PMH: DM, TIA, hypertension, CHF,bipolar disorder.   Clinical Impression  Pt mobilizing well during initial PT session, able to ambulate 5 ft with rw and min guard assist. Pt reporting that he is planning to D/C to home with his wife's assistance. He describes that he uses a w/c for the majority of his mobility at home and uses his rw for only short distances and transfers. Pt will need to attempt stairs prior to D/C. PT to continue to follow and progress mobility as tolerated.     Follow Up Recommendations Home health PT;Supervision for mobility/OOB    Equipment Recommendations  Crutches (anticipate need for stairs)    Recommendations for Other Services       Precautions / Restrictions Precautions Precautions: Fall Required Braces or Orthoses: Other Brace/Splint Other Brace/Splint: post op shoe Restrictions Weight Bearing Restrictions: Yes RLE Weight Bearing: Touchdown weight bearing      Mobility  Bed Mobility Overal bed mobility: Needs Assistance Bed Mobility: Supine to Sit     Supine to sit: Supervision     General bed mobility comments: supervision for safety  Transfers Overall transfer level: Needs assistance Equipment used: Rolling walker (2 wheeled) Transfers: Sit to/from Stand Sit to Stand: Min guard         General transfer comment: safe technique demonstrated  Ambulation/Gait Ambulation/Gait assistance: Min guard Ambulation Distance (Feet): 5 Feet Assistive device: Rolling walker (2 wheeled) Gait Pattern/deviations:  (swing-to pattern) Gait velocity: decreased   General Gait Details: Pt NWB through Rt LE during ambulation.   Stairs            Wheelchair Mobility    Modified Rankin (Stroke Patients Only)        Balance Overall balance assessment: Needs assistance Sitting-balance support: No upper extremity supported Sitting balance-Leahy Scale: Good     Standing balance support: Bilateral upper extremity supported Standing balance-Leahy Scale: Poor Standing balance comment: using rw                             Pertinent Vitals/Pain Pain Assessment: 0-10 Pain Score: 8  Pain Location: Rt foot Pain Descriptors / Indicators: Sore Pain Intervention(s): Limited activity within patient's tolerance;Monitored during session    Home Living Family/patient expects to be discharged to:: Private residence Living Arrangements: Spouse/significant other;Other relatives Available Help at Discharge: Family;Available 24 hours/day Type of Home: House Home Access: Stairs to enter Entrance Stairs-Rails: Right Entrance Stairs-Number of Steps: 3 Home Layout: One level Home Equipment: Walker - 2 wheels;Cane - single point;Wheelchair - manual      Prior Function Level of Independence: Needs assistance   Gait / Transfers Assistance Needed: using rw for short ambulation, primarily using w/c inside home.   ADL's / Homemaking Assistance Needed: states that his wife assists with dressing and bathing tasks.         Hand Dominance        Extremity/Trunk Assessment   Upper Extremity Assessment: Overall WFL for tasks assessed           Lower Extremity Assessment: RLE deficits/detail RLE Deficits / Details: able to lift and move independently.        Communication   Communication: No difficulties  Cognition Arousal/Alertness: Awake/alert Behavior During Therapy: [facility]  for tasks assessed/performed Overall Cognitive Status: Within Functional Limits for tasks assessed                      General Comments      Exercises        Assessment/Plan    PT Assessment Patient needs continued PT services  PT Diagnosis Difficulty walking   PT Problem List Decreased  strength;Decreased range of motion;Decreased activity tolerance;Decreased balance;Decreased mobility;Decreased safety awareness  PT Treatment Interventions DME instruction;Gait training;Stair training;Functional mobility training;Therapeutic activities;Therapeutic exercise;Patient/family education   PT Goals (Current goals can be found in the Care Plan section) Acute Rehab PT Goals Patient Stated Goal: go home PT Goal Formulation: With patient Time For Goal Achievement: 02/01/16 Potential to Achieve Goals: Good    Frequency Min 3X/week   Barriers to discharge        Co-evaluation               End of Session Equipment Utilized During Treatment: Gait belt Activity Tolerance: Patient tolerated treatment well Patient left: in chair;with call bell/phone within reach (Rt LE elevated) Nurse Communication: Mobility status;Weight bearing status         Time: [id number] PT Time Calculation (min) (ACUTE ONLY): 23 min   Charges:   PT Evaluation $PT Eval Moderate Complexity: 1 Procedure PT Treatments $Therapeutic Activity: 8-22 mins   PT G Codes:        Cassell Clement, PT, CSCS Pager [phone] Office [phone]  01/18/2016, 1:36 PM

## 2016-01-18 NOTE — Progress Notes (Signed)
Orthopedic Tech Progress Note Patient Details:  Shane [name] April 11, 1971 [id number]  Ortho Devices Type of Ortho Device: Postop shoe/boot Ortho Device/Splint Interventions: Application   Shane [name] 01/18/2016, 5:31 PM

## 2016-01-18 NOTE — Progress Notes (Signed)
PROGRESS NOTE    Shane [name]  [id number] DOB: 1971-03-05 DOA: 01/11/2016 PCP: Minerva Ends, MD    Brief Narrative: Shane [name] a 45 y.o.malewith medical history significant of DM2 admitted for a diabetic right foot ulcer.    Assessment & Plan:   Principal Problem:   Cellulitis of right ankle Active Problems:   Essential hypertension   Chronic anemia   Diabetic foot infection (HCC)   Cellulitis and abscess, left foot   Type 2 diabetes mellitus with vascular disease (HCC)   Cellulitis of left ankle   Diabetic ulcer of left foot associated with type 2 diabetes mellitus (HCC)   Swelling of foot joint   Cellulitis and possible abscess of the right foot: S/p I&D on 8/2, resume IV antibiotics and wound vac,  Pain control.  Changed oxy to vicodin.   Left foot ulcer stable.   Hypertension: Well controlled.  Resume lisinopril and coreg.    Iron deficient/ normocytic anemia: Low iron levels from 7/16, he is Jehovah's witness and will not take any blood products, but will use IV iron if needed.  Pt reports blood in the stools occasionally has been going on for a year now, never had any work up done with an endoscopy or colonoscopy.  Stool for occult blood ordered. Gastroenterology consulted , to see if needs EGD or colonoscopy .    Diabetes mellitus: CBG (last 3)   Recent Labs  01/17/16 2131 01/18/16 0616 01/18/16 1135  GLUCAP 143* 122* 150*    Resume SSI. Continue the lantus of 25 units and novolog TIDAC.  No new changes to meds.    DVT prophylaxis: (Lovenox) Code Status: (Full/ Family Communication: family at bedside.  Disposition Plan: pending further eval.   Consultants:   Orthopedics Dr Sharol Given  ID  gastroenterology   Procedures: I&D on 8/2   Antimicrobials:  Vancomycin 7/28 > 8/1  Ceftriaxone and flagyl 7/28  Ancef 7/28 > 7/31  Unasyn 7/31 > 8/1  Zosyn 8/1 >   Linezolid 8/1 >  Subjective: Reports oxy is not working,  requesting for vicodin.   Objective: Vitals:   01/17/16 2011 01/18/16 0039 01/18/16 0355 01/18/16 1300  BP: 127/71 (!) 158/85 (!) 167/86 139/78  Pulse: 86 85 80 81  Resp: 16 18 18 20   Temp: 98.2 F (36.8 C) 98 F (36.7 C) 98 F (36.7 C) 97.8 F (36.6 C)  TempSrc: Oral Oral Oral   SpO2: 97% 99% 98% 97%  Weight:      Height:        Intake/Output Summary (Last 24 hours) at 01/18/16 1531 Last data filed at 01/18/16 1300  Gross per 24 hour  Intake              840 ml  Output             1400 ml  Net             -560 ml   Filed Weights   01/15/16 1800  Weight: 72.1 kg (158 lb 15.9 oz)    Examination:  General exam: Appears calm and comfortable  Respiratory system: Clear to auscultation. Respiratory effort normal. Cardiovascular system: S1 & S2 heard, RRR. No JVD, murmurs, rubs, gallops or clicks. No pedal edema. Gastrointestinal system: Abdomen is nondistended, soft and nontender. No organomegaly or masses felt. Normal bowel sounds heard. Central nervous system: Alert and oriented. No focal neurological deficits. Extremities: right lower extremity erythematous, s/p I&D , wound vac in place SLIGHTLY leaking.  Psychiatry: Judgement and insight appear normal. Mood & affect appropriate.     Data Reviewed: I have personally reviewed following labs and imaging studies  CBC:  Recent Labs Lab 01/12/16 0110  01/15/16 0322 01/16/16 0609 01/16/16 0914 01/17/16 0620 01/18/16 0435  WBC 9.1  < > 8.1 10.3 9.7 10.1 9.6  NEUTROABS 6.2  --   --   --  6.7  --   --   HGB 9.3*  < > 8.0* 6.5* 8.3* 7.3* 7.1*  HCT 28.3*  < > 24.8* 20.6* 26.1* 23.7* 22.8*  MCV 93.4  < > 93.9 94.1 94.9 95.6 96.2  PLT 273  < > 274 383 327 340 323  < > = values in this interval not displayed. Basic Metabolic Panel:  Recent Labs Lab 01/12/16 0110 01/15/16 0322 01/16/16 0609 01/17/16 0620 01/18/16 0435  NA 139 138 141 138 140  K 4.9 4.3 4.3 4.1 4.7  CL 105 103 107 103 104  CO2 26 28 28 29 29     GLUCOSE 288* 225* 114* 150* 130*  BUN 16 17 20  21* 20  CREATININE 1.12 1.07 1.04 1.21 1.07  CALCIUM 9.2 8.9 9.1 8.4* 8.4*   GFR: Estimated Creatinine Clearance: 83.5 mL/min (by C-G formula based on SCr of 1.07 mg/dL). Liver Function Tests: No results for input(s): AST, ALT, ALKPHOS, BILITOT, PROT, ALBUMIN in the last 168 hours. No results for input(s): LIPASE, AMYLASE in the last 168 hours. No results for input(s): AMMONIA in the last 168 hours. Coagulation Profile:  Recent Labs Lab 01/16/16 0914  INR 1.06   Cardiac Enzymes: No results for input(s): CKTOTAL, CKMB, CKMBINDEX, TROPONINI in the last 168 hours. BNP (last 3 results) No results for input(s): PROBNP in the last 8760 hours. HbA1C: No results for input(s): HGBA1C in the last 72 hours. CBG:  Recent Labs Lab 01/17/16 1203 01/17/16 1632 01/17/16 2131 01/18/16 0616 01/18/16 1135  GLUCAP 211* 324* 143* 122* 150*   Lipid Profile: No results for input(s): CHOL, HDL, LDLCALC, TRIG, CHOLHDL, LDLDIRECT in the last 72 hours. Thyroid Function Tests: No results for input(s): TSH, T4TOTAL, FREET4, T3FREE, THYROIDAB in the last 72 hours. Anemia Panel:  Recent Labs  01/16/16 0914  RETICCTPCT 2.1   Sepsis Labs:  Recent Labs Lab 01/12/16 0256  LATICACIDVEN 1.14    Recent Results (from the past 240 hour(s))  Culture, blood (Routine X 2) w Reflex to ID Panel     Status: None   Collection Time: 01/12/16  2:45 AM  Result Value Ref Range Status   Specimen Description BLOOD RIGHT ARM  Final   Special Requests BOTTLES DRAWN AEROBIC AND ANAEROBIC 10ML  Final   Culture NO GROWTH 5 DAYS  Final   Report Status 01/17/2016 FINAL  Final  Culture, blood (Routine X 2) w Reflex to ID Panel     Status: None   Collection Time: 01/12/16  2:55 AM  Result Value Ref Range Status   Specimen Description BLOOD RIGHT ARM  Final   Special Requests IN PEDIATRIC BOTTLE 4ML  Final   Culture NO GROWTH 5 DAYS  Final   Report Status  01/17/2016 FINAL  Final  Culture, Urine     Status: None   Collection Time: 01/12/16  5:56 PM  Result Value Ref Range Status   Specimen Description URINE, RANDOM  Final   Special Requests NONE  Final   Culture NO GROWTH  Final   Report Status 01/13/2016 FINAL  Final  Surgical pcr screen  Status: Abnormal   Collection Time: 01/16/16 11:43 PM  Result Value Ref Range Status   MRSA, PCR NEGATIVE NEGATIVE Final   Staphylococcus aureus POSITIVE (A) NEGATIVE Final    Comment:        The Xpert SA Assay (FDA approved for NASAL specimens in patients over 34 years of age), is one component of a comprehensive surveillance program.  Test performance has been validated by [facility] for patients greater than or equal to 72 year old. It is not intended to diagnose infection nor to guide or monitor treatment.   Aerobic/Anaerobic Culture (surgical/deep wound)     Status: None (Preliminary result)   Collection Time: 01/17/16 11:26 AM  Result Value Ref Range Status   Specimen Description ABSCESS RIGHT FOOT  Final   Special Requests POF ZOSYN  Final   Gram Stain   Final    MODERATE WBC PRESENT,BOTH PMN AND MONONUCLEAR NO ORGANISMS SEEN    Culture NO GROWTH < 24 HOURS  Final   Report Status PENDING  Incomplete         Radiology Studies: No results found.      Scheduled Meds: . atorvastatin  80 mg Oral q1800  . carvedilol  12.5 mg Oral Q breakfast  . divalproex  1,000 mg Oral QHS  . enoxaparin (LOVENOX) injection  40 mg Subcutaneous Q24H  . feeding supplement (GLUCERNA SHAKE)  237 mL Oral BID BM  . ferrous sulfate  325 mg Oral Q breakfast  . gabapentin  200 mg Oral TID  . ibuprofen  400 mg Oral TID  . insulin aspart  0-15 Units Subcutaneous TID WC  . insulin aspart  5 Units Subcutaneous TID WC  . insulin glargine  25 Units Subcutaneous Q2200  . linezolid (ZYVOX) IV  600 mg Intravenous Q12H  . lisinopril  40 mg Oral Daily  . multivitamin with minerals  1 tablet Oral Daily   . mupirocin ointment   Nasal BID  . pantoprazole  40 mg Oral Daily  . piperacillin-tazobactam (ZOSYN)  IV  3.375 g Intravenous Q8H  . polyethylene glycol  17 g Oral Daily  . saccharomyces boulardii  250 mg Oral BID  . senna  2 tablet Oral QHS   Continuous Infusions: . sodium chloride    . lactated ringers 10 mL/hr at 01/17/16 0940     LOS: 6 days    Time spent: 30 minutes.     Hosie Poisson, MD [facility] Pager [phone]  If 7PM-7AM, please contact night-coverage [email or web address] Password [email or web address] 01/18/2016, 3:31 PM

## 2016-01-18 NOTE — Consult Note (Signed)
Referring Provider: Hosie Poisson, MD Primary Care Physician:  Minerva Ends, MD Primary Gastroenterologist:  Althia Forts   Reason for Consultation:  Iron deficiency anemia  HPI: Shane [name] is a 45 y.o. male with a history of DM2, HTN, CHF, and TIA. Was found to have cellulitis on 01/12/16 that did not respond well to conservative measures requiring surgical management yesterday, followed by wound VAC. Now found to have normocytic anemia and refuses blood transfusion as he is a Jehovah's witness.  He endorses that he has cramping abdominal, diarrhea streaked with bright red blood  3 x a month for the past 2 years, only lasting for 1 bowel movement at a time. He has also noticed 8 episodes of fecal incontinence over night for the past year, described as diarrhea.He associates fecal incontinence when he eats fried foods or corn.  His uncle had colon cancer and he feels like he needs a colonoscopy. Since the beginning of this year his PCP started him on iron supplementation for his anemia. Since then he has noticed that his bowel movements have been hard, small, round balls in between diarrhea episodes. He denies any nausea or vomiting, but foul smelling burps describes as "something died".  Has intermittent chills and night sweats for the past year. He is tired most days and worse when he get active since his diagnosis of anemia.      Past Medical History:  Diagnosis Date  . Anemia   . Anxiety   . Bipolar disorder ([place])   . Chest pain    a. 2015 Reportedly normal stress test in [place].  Marland Kitchen Chronic diastolic CHF (congestive heart failure) (HCC)    a.03/2015 Echo: EF 55-60%, Gr 1 DD, mild MR, triv PR.  . Depression   . GERD (gastroesophageal reflux disease)   . Hyperlipidemia   . Hypertension    a. 08/2014 Admitted with hypertensive urgency.  . Refusal of blood transfusions as patient is Jehovah's Witness   . Sleep apnea   . TIA (transient ischemic attack) 08/2014; 03/2015   a. 08/2014 in  setting of hypertensive urgency.  . Type II diabetes mellitus ([place])     Past Surgical History:  Procedure Laterality Date  . AMPUTATION Left 08/03/2015   Procedure: AMPUTATION LEFT GREAT TOE;  Surgeon: Newt Minion, MD;  Location: [facility];  Service: Orthopedics;  Laterality: Left;  . AMPUTATION Left 12/02/2015   Procedure: amputation of left 2nd digit  . APPLICATION OF WOUND VAC Left 12/19/2015   Procedure: APPLICATION OF WOUND VAC;  Surgeon: Meredith Pel, MD;  Location: [facility];  Service: Orthopedics;  Laterality: Left;  . CIRCUMCISION    . I&D EXTREMITY Left 12/02/2015   Procedure: IRRIGATION AND DEBRIDEMENT OF FOOT; LEFT SECOND TOE AMPUTATION;  Surgeon: Meredith Pel, MD;  Location: [facility];  Service: Orthopedics;  Laterality: Left;  . I&D EXTREMITY Left 12/19/2015   Procedure: I & D LEFT FOOT WITH BEADS ;  Surgeon: Meredith Pel, MD;  Location: [facility];  Service: Orthopedics;  Laterality: Left;  . I&D EXTREMITY Right 01/17/2016   Procedure: IRRIGATION AND DEBRIDEMENT RIGHT FOOT;  Surgeon: Newt Minion, MD;  Location: [facility];  Service: Orthopedics;  Laterality: Right;  . INCISION AND DRAINAGE FOOT Right 01/17/2016  . INGUINAL HERNIA REPAIR Bilateral ~ 1983- ~ 1986  . TONSILLECTOMY  ~ 1985    Prior to Admission medications   Medication Sig Start Date End Date Taking? Authorizing Provider  aspirin EC 325 MG tablet  Take 1 tablet (325 mg total) by mouth daily. 09/04/15  Yes Pete Glatter, MD  atorvastatin (LIPITOR) 80 MG tablet Take 1 tablet (80 mg total) by mouth every morning. 08/18/15  Yes Josalyn Funches, MD  carvedilol (COREG) 12.5 MG tablet TAKE 1 TABLET BY MOUTH 2 TIMES DAILY WITH A MEAL Patient taking differently: TAKE 1 TABLET BY MOUTH IN THE MORNING. 05/17/15  Yes Josalyn Funches, MD  divalproex (DEPAKOTE) 500 MG DR tablet Take 2 tablets (1,000 mg total) by mouth at bedtime. 12/04/15  Yes Maryruth Bun Rama, MD  ferrous sulfate 325 (65 FE) MG tablet Take 1 tablet (325 mg total)  by mouth 2 (two) times daily with a meal. Patient taking differently: Take 325 mg by mouth daily with breakfast.  08/18/15  Yes Josalyn Funches, MD  FLUoxetine (PROZAC) 10 MG tablet Take 1 tablet (10 mg total) by mouth every morning. 01/02/16  Yes Meredeth Ide, MD  gabapentin (NEURONTIN) 100 MG capsule Take 1 capsule (100 mg total) by mouth 3 (three) times daily. 12/23/15  Yes Ripudeep Jenna Luo, MD  glipiZIDE (GLUCOTROL) 10 MG tablet Take 1 tablet (10 mg total) by mouth 2 (two) times daily before a meal. Patient taking differently: Take 10 mg by mouth daily before breakfast.  08/18/15  Yes Josalyn Funches, MD  Insulin Glargine (LANTUS SOLOSTAR) 100 UNIT/ML Solostar Pen Inject 10 Units into the skin daily at 10 pm. 12/28/15  Yes Josalyn Funches, MD  lisinopril (PRINIVIL,ZESTRIL) 40 MG tablet Take 1 tablet (40 mg total) by mouth daily. 08/18/15  Yes Dessa Phi, MD  metFORMIN (GLUCOPHAGE) 1000 MG tablet Take 1 tablet (1,000 mg total) by mouth 2 (two) times daily with a meal. 12/22/15  Yes Ripudeep K Rai, MD  omeprazole (PRILOSEC) 20 MG capsule Take 1 capsule (20 mg total) by mouth daily. 08/18/15  Yes Josalyn Funches, MD  traZODone (DESYREL) 50 MG tablet Take 0.5-1 tablets (25-50 mg total) by mouth at bedtime as needed for sleep. 11/21/15  Yes Josalyn Funches, MD  amoxicillin-clavulanate (AUGMENTIN) 875-125 MG tablet Take 1 tablet by mouth 2 (two) times daily. 01/15/16   Renae Fickle, MD  Blood Glucose Monitoring Suppl (TRUE METRIX METER) w/Device KIT Use as directed 12/29/15   Dessa Phi, MD  glucose blood (TRUE METRIX BLOOD GLUCOSE TEST) test strip Use as instructed 12/29/15   Josalyn Funches, MD  Insulin Pen Needle (B-D ULTRAFINE III SHORT PEN) 31G X 8 MM MISC 1 application by Does not apply route daily. 12/28/15   Dessa Phi, MD  mupirocin ointment (BACTROBAN) 2 % Apply topically daily. Tops of feet 01/15/16   Renae Fickle, MD  saccharomyces boulardii (FLORASTOR) 250 MG capsule Take 1 capsule (250 mg  total) by mouth 2 (two) times daily. 01/15/16   Renae Fickle, MD  TRUEPLUS LANCETS 28G MISC Use as directed 12/29/15   Dessa Phi, MD    Current Facility-Administered Medications  Medication Dose Route Frequency Provider Last Rate Last Dose  . 0.9 %  sodium chloride infusion   Intravenous Continuous Nadara Mustard, MD      . acetaminophen (TYLENOL) tablet 650 mg  650 mg Oral Q6H PRN Nadara Mustard, MD       Or  . acetaminophen (TYLENOL) suppository 650 mg  650 mg Rectal Q6H PRN Nadara Mustard, MD      . atorvastatin (LIPITOR) tablet 80 mg  80 mg Oral q1800 Hillary Bow, DO   80 mg at 01/17/16 1718  . carvedilol (COREG) tablet  12.5 mg  12.5 mg Oral Q breakfast Etta Quill, DO   12.5 mg at 01/18/16 8299  . divalproex (DEPAKOTE) DR tablet 1,000 mg  1,000 mg Oral QHS Etta Quill, DO   1,000 mg at 01/17/16 2246  . enoxaparin (LOVENOX) injection 40 mg  40 mg Subcutaneous Q24H Janece Canterbury, MD   40 mg at 01/17/16 2258  . feeding supplement (GLUCERNA SHAKE) (GLUCERNA SHAKE) liquid 237 mL  237 mL Oral BID BM Janece Canterbury, MD   237 mL at 01/18/16 0941  . ferrous sulfate tablet 325 mg  325 mg Oral Q breakfast Etta Quill, DO   325 mg at 01/18/16 3716  . gabapentin (NEURONTIN) capsule 200 mg  200 mg Oral TID Janece Canterbury, MD   200 mg at 01/18/16 0940  . HYDROcodone-acetaminophen (NORCO/VICODIN) 5-325 MG per tablet 1-2 tablet  1-2 tablet Oral Q4H PRN Hosie Poisson, MD      . ibuprofen (ADVIL,MOTRIN) tablet 400 mg  400 mg Oral TID Janece Canterbury, MD   400 mg at 01/18/16 0940  . insulin aspart (novoLOG) injection 0-15 Units  0-15 Units Subcutaneous TID WC Etta Quill, DO   2 Units at 01/18/16 1304  . insulin aspart (novoLOG) injection 5 Units  5 Units Subcutaneous TID WC Janece Canterbury, MD   5 Units at 01/18/16 1304  . insulin glargine (LANTUS) injection 25 Units  25 Units Subcutaneous Q2200 Janece Canterbury, MD   25 Units at 01/17/16 2247  . lactated ringers infusion    Intravenous Continuous Newt Minion, MD 10 mL/hr at 01/17/16 0940    . linezolid (ZYVOX) IVPB 600 mg  600 mg Intravenous Q12H Truman Hayward, MD   600 mg at 01/18/16 9678  . lisinopril (PRINIVIL,ZESTRIL) tablet 40 mg  40 mg Oral Daily Etta Quill, DO   40 mg at 01/18/16 0940  . methocarbamol (ROBAXIN) tablet 500 mg  500 mg Oral Q6H PRN Newt Minion, MD   500 mg at 01/18/16 9381   Or  . methocarbamol (ROBAXIN) 500 mg in dextrose 5 % 50 mL IVPB  500 mg Intravenous Q6H PRN Newt Minion, MD      . metoCLOPramide (REGLAN) tablet 5-10 mg  5-10 mg Oral Q8H PRN Newt Minion, MD       Or  . metoCLOPramide (REGLAN) injection 5-10 mg  5-10 mg Intravenous Q8H PRN Newt Minion, MD      . multivitamin with minerals tablet 1 tablet  1 tablet Oral Daily Janece Canterbury, MD   1 tablet at 01/18/16 0940  . mupirocin ointment (BACTROBAN) 2 %   Nasal BID Newt Minion, MD      . ondansetron [facility]) tablet 4 mg  4 mg Oral Q6H PRN Newt Minion, MD       Or  . ondansetron [facility]) injection 4 mg  4 mg Intravenous Q6H PRN Newt Minion, MD      . pantoprazole (PROTONIX) EC tablet 40 mg  40 mg Oral Daily Etta Quill, DO   40 mg at 01/18/16 0940  . piperacillin-tazobactam (ZOSYN) IVPB 3.375 g  3.375 g Intravenous Q8H Donalynn Furlong Carlyss, RPH   3.375 g at 01/18/16 0175  . polyethylene glycol (MIRALAX / GLYCOLAX) packet 17 g  17 g Oral Daily Janece Canterbury, MD   17 g at 01/16/16 0854  . saccharomyces boulardii (FLORASTOR) capsule 250 mg  250 mg Oral BID Janece Canterbury, MD   250  mg at 01/18/16 0940  . senna (SENOKOT) tablet 17.2 mg  2 tablet Oral QHS Janece Canterbury, MD   17.2 mg at 01/17/16 2246  . traZODone (DESYREL) tablet 25-50 mg  25-50 mg Oral QHS PRN Etta Quill, DO   50 mg at 01/15/16 2246    Allergies as of 01/11/2016 - Review Complete 01/11/2016  Allergen Reaction Noted  . Other Other (See Comments) 07/15/2015  . Milk-related compounds Diarrhea and Other (See Comments) 02/19/2015     Family History  Problem Relation Age of Onset  . Hypertension Mother   . Diabetes Mother   . Hyperlipidemia Mother   . Heart disease Mother     s/p pacemaker  . Diabetes Father   . Hypertension Father   . Stroke Father   . Heart attack Father     first MI @ 73.  . Stroke Brother     Social History   Social History  . Marital status: Married    Spouse name: N/A  . Number of children: N/A  . Years of education: N/A   Occupational History  . Not on file.   Social History Main Topics  . Smoking status: Never Smoker  . Smokeless tobacco: Never Used  . Alcohol use No  . Drug use: No  . Sexual activity: No   Other Topics Concern  . Not on file   Social History Narrative   Lives in [place] with wife.  Active but doesn't routinely exercise.    Review of Systems: Gen: chills, tiredness, and night sweats CV: Denies chest pain Resp: Denies dyspnea GI: Denies vomiting blood, jaundice   Denies dysphagia or odynophagia. GU : Denies urinary burning, blood in urine, urinary frequency MS: Denies joint pain.  Derm: Denies rash Psych: Denies confusion or memory loss Heme: rectal bleeding Neuro:  Denies any headaches and  dizziness   Physical Exam: Vital signs in last 24 hours: Temp:  [98 F (36.7 C)-98.2 F (36.8 C)] 98 F (36.7 C) (08/03 0355) Pulse Rate:  [80-86] 80 (08/03 0355) Resp:  [16-18] 18 (08/03 0355) BP: (127-167)/(71-86) 167/86 (08/03 0355) SpO2:  [97 %-99 %] 98 % (08/03 0355) Last BM Date: 01/16/16 General:   Alert,  Well-developed, well-nourished, pleasant and cooperative in NAD Head:  Normocephalic and atraumatic. Eyes:  Sclera clear, no icterus.   Conjunctiva pallor. Ears:  Normal auditory acuity. Nose:  No deformity, discharge,  or lesions. Mouth:  No deformity or lesions.   Neck:  Supple; no masses or thyromegaly. Lungs:  Clear throughout to auscultation.   No wheezes, crackles, or rhonchi. Heart:  Regular rate and rhythm; no murmurs, clicks,  rubs,  or gallops. Abdomen: diffuse tenderness and mildly distented, BS active,nonpalp mass or hsm.   Rectal:  Deferred  Msk:  Symmetrical without gross deformities. . Pulses:  Normal pulses noted. Extremities:  Without clubbing or edema. Partial left foot amputation Neurologic:  Alert and  oriented x3;  grossly normal neurologically. Skin:  Intact without significant lesions or rashes.. Psych:  Alert and cooperative. Normal mood and affect.  Intake/Output from previous day: 08/02 0701 - 08/03 0700 In: 445 [I.V.:445] Out: 1060 [Urine:950; Drains:100; Blood:10] Intake/Output this shift: Total I/O In: -  Out: 550 [Urine:550]  Lab Results:  Recent Labs  01/16/16 0914 01/17/16 0620 01/18/16 0435  WBC 9.7 10.1 9.6  HGB 8.3* 7.3* 7.1*  HCT 26.1* 23.7* 22.8*  PLT 327 340 323   BMET  Recent Labs  01/16/16 0609 01/17/16 0620 01/18/16 0435  NA 141 138 140  K 4.3 4.1 4.7  CL 107 103 104  CO2 _0 GLUCOSE 114* 150* 130*  BUN 20 21* 20  CREATININE 1.04 1.21 1.07  CALCIUM 9.1 8.4* 8.4*   LFT No results for input(s): PROT, ALBUMIN, AST, ALT, ALKPHOS, BILITOT, BILIDIR, IBILI in the last 72 hours. PT/INR  Recent Labs  01/16/16 0914  LABPROT 13.8  INR 1.06   Hepatitis Panel  Recent Labs  01/17/16 0620  HEPBSAG Negative  HCVAB <0.1  HEPAIGM Negative  HEPBIGM Negative      Studies/Results: No results found.  IMPRESSION:  * Rectal Bleeding: appears to be present for the past 2 years. Hemoglobin is 7.1 today.  Vitals ar stable.   *Normocytic anemia: hemoglobin; 8 on 7/31>>> 6.5 on 8/1>>> today 7.1. Ferritin from 12/31/15 within normal limits. Likely secondary to rectal bleeding.   PLAN: *Discuss EGD and colonoscopy with Dr. Havery Moros.  Grayland Ormond PA-S 01/18/2016, 2:01 PM

## 2016-01-18 NOTE — Progress Notes (Addendum)
Charge RN text paged [facility] on-call regard patient status. 9/10 chest pressure and feeling fatigued. Patient states this has happened about a year ago. NT to obtain EKG. Vitals HR 88, 163/93. Rapid Response RN called and updated about situation. Will continue to assess.

## 2016-01-19 ENCOUNTER — Inpatient Hospital Stay (HOSPITAL_COMMUNITY): Payer: Medicaid Other | Admitting: Certified Registered Nurse Anesthetist

## 2016-01-19 ENCOUNTER — Encounter (HOSPITAL_COMMUNITY)
Admission: EM | Disposition: A | Discharge status: Home Health | Payer: Self-pay | Source: Home / Self Care | Attending: Internal Medicine

## 2016-01-19 ENCOUNTER — Inpatient Hospital Stay (HOSPITAL_COMMUNITY): Payer: Medicaid Other

## 2016-01-19 ENCOUNTER — Encounter (HOSPITAL_COMMUNITY): Payer: Self-pay | Admitting: Certified Registered Nurse Anesthetist

## 2016-01-19 DIAGNOSIS — I10 Essential (primary) hypertension: Secondary | ICD-10-CM

## 2016-01-19 DIAGNOSIS — L02611 Cutaneous abscess of right foot: Secondary | ICD-10-CM

## 2016-01-19 DIAGNOSIS — Z9889 Other specified postprocedural states: Secondary | ICD-10-CM

## 2016-01-19 DIAGNOSIS — D649 Anemia, unspecified: Secondary | ICD-10-CM

## 2016-01-19 HISTORY — PX: ESOPHAGOGASTRODUODENOSCOPY: SHX5428

## 2016-01-19 LAB — COMPREHENSIVE METABOLIC PANEL
ALBUMIN: 3.1 g/dL — AB (ref 3.5–5.0)
ALK PHOS: 45 U/L (ref 38–126)
ALT: 25 U/L (ref 17–63)
ANION GAP: 7 (ref 5–15)
AST: 22 U/L (ref 15–41)
BILIRUBIN TOTAL: 0.2 mg/dL — AB (ref 0.3–1.2)
BUN: 19 mg/dL (ref 6–20)
CO2: 28 mmol/L (ref 22–32)
Calcium: 8.4 mg/dL — ABNORMAL LOW (ref 8.9–10.3)
Chloride: 105 mmol/L (ref 101–111)
Creatinine, Ser: 1.22 mg/dL (ref 0.61–1.24)
GFR calc Af Amer: >60 mL/min (ref >60)
GLUCOSE: 239 mg/dL — AB (ref 65–99)
Potassium: 4.6 mmol/L (ref 3.5–5.1)
Sodium: 140 mmol/L (ref 135–145)
TOTAL PROTEIN: 6.9 g/dL (ref 6.5–8.1)

## 2016-01-19 LAB — BASIC METABOLIC PANEL
ANION GAP: 6 (ref 5–15)
BUN: 17 mg/dL (ref 6–20)
CHLORIDE: 105 mmol/L (ref 101–111)
CO2: 29 mmol/L (ref 22–32)
Calcium: 8.4 mg/dL — ABNORMAL LOW (ref 8.9–10.3)
Creatinine, Ser: 1.1 mg/dL (ref 0.61–1.24)
GFR calc non Af Amer: >60 mL/min (ref >60)
GLUCOSE: 146 mg/dL — AB (ref 65–99)
Potassium: 4.6 mmol/L (ref 3.5–5.1)
Sodium: 140 mmol/L (ref 135–145)

## 2016-01-19 LAB — GLUCOSE, CAPILLARY
GLUCOSE-CAPILLARY: 138 mg/dL — AB (ref 65–99)
GLUCOSE-CAPILLARY: 188 mg/dL — AB (ref 65–99)
GLUCOSE-CAPILLARY: 112 mg/dL — AB (ref 65–99)
Glucose-Capillary: 137 mg/dL — ABNORMAL HIGH (ref 65–99)

## 2016-01-19 LAB — TROPONIN I: Troponin I: <0.03 ng/mL (ref <0.03)

## 2016-01-19 LAB — CBC
HEMATOCRIT: 23.4 % — AB (ref 39.0–52.0)
HEMATOCRIT: 24.4 % — AB (ref 39.0–52.0)
HEMOGLOBIN: 7.3 g/dL — AB (ref 13.0–17.0)
Hemoglobin: 7.6 g/dL — ABNORMAL LOW (ref 13.0–17.0)
MCH: 30.3 pg (ref 26.0–34.0)
MCH: 30.5 pg (ref 26.0–34.0)
MCHC: 31.1 g/dL (ref 30.0–36.0)
MCHC: 31.2 g/dL (ref 30.0–36.0)
MCV: 97.1 fL (ref 78.0–100.0)
MCV: 98 fL (ref 78.0–100.0)
Platelets: 343 10*3/uL (ref 150–400)
Platelets: 345 10*3/uL (ref 150–400)
RBC: 2.41 MIL/uL — AB (ref 4.22–5.81)
RBC: 2.49 MIL/uL — ABNORMAL LOW (ref 4.22–5.81)
RDW: 14 % (ref 11.5–15.5)
RDW: 14.2 % (ref 11.5–15.5)
WBC: 10.1 10*3/uL (ref 4.0–10.5)
WBC: 8.7 10*3/uL (ref 4.0–10.5)

## 2016-01-19 LAB — D-DIMER, QUANTITATIVE: D-Dimer, Quant: 2.35 ug/mL-FEU — ABNORMAL HIGH (ref 0.00–0.50)

## 2016-01-19 LAB — BRAIN NATRIURETIC PEPTIDE: B Natriuretic Peptide: 131.1 pg/mL — ABNORMAL HIGH (ref 0.0–100.0)

## 2016-01-19 SURGERY — EGD (ESOPHAGOGASTRODUODENOSCOPY)
Anesthesia: Monitor Anesthesia Care

## 2016-01-19 MED ORDER — LACTATED RINGERS IV SOLN
INTRAVENOUS | Status: DC
  Administered 2016-01-19: 1000 mL via INTRAVENOUS

## 2016-01-19 MED ORDER — SODIUM CHLORIDE 0.9 % IV SOLN
3.0000 g | Freq: Three times a day (TID) | INTRAVENOUS | Status: DC
  Administered 2016-01-19 – 2016-01-21 (×6): 3 g via INTRAVENOUS
  Filled 2016-01-19 (×8): qty 3

## 2016-01-19 MED ORDER — PROPOFOL 500 MG/50ML IV EMUL
INTRAVENOUS | Status: DC | PRN
  Administered 2016-01-19: 75 ug/kg/min via INTRAVENOUS

## 2016-01-19 MED ORDER — GI COCKTAIL ~~LOC~~
30.0000 mL | Freq: Two times a day (BID) | ORAL | Status: DC | PRN
  Filled 2016-01-19: qty 30

## 2016-01-19 MED ORDER — BUTAMBEN-TETRACAINE-BENZOCAINE 2-2-14 % EX AERO
INHALATION_SPRAY | CUTANEOUS | Status: DC | PRN
  Administered 2016-01-19: 2 via TOPICAL

## 2016-01-19 MED ORDER — IOPAMIDOL (ISOVUE-370) INJECTION 76%
INTRAVENOUS | Status: AC
  Administered 2016-01-19: 100 mL
  Filled 2016-01-19: qty 100

## 2016-01-19 MED ORDER — LACTATED RINGERS IV SOLN
INTRAVENOUS | Status: DC | PRN
  Administered 2016-01-19: 08:00:00 via INTRAVENOUS

## 2016-01-19 MED ORDER — ENOXAPARIN SODIUM 40 MG/0.4ML ~~LOC~~ SOLN
40.0000 mg | SUBCUTANEOUS | Status: DC
  Administered 2016-01-20 – 2016-01-22 (×3): 40 mg via SUBCUTANEOUS
  Filled 2016-01-19 (×3): qty 0.4

## 2016-01-19 MED ORDER — PEG-KCL-NACL-NASULF-NA ASC-C 100 G PO SOLR
1.0000 | Freq: Once | ORAL | Status: AC
  Administered 2016-01-19: 200 g via ORAL
  Filled 2016-01-19: qty 1

## 2016-01-19 MED ORDER — GI COCKTAIL ~~LOC~~
30.0000 mL | Freq: Once | ORAL | Status: AC
Start: 2016-01-19 — End: 2016-01-19
  Administered 2016-01-19: 30 mL via ORAL
  Filled 2016-01-19 (×2): qty 30

## 2016-01-19 MED ORDER — SODIUM CHLORIDE 0.9 % IV SOLN: INTRAVENOUS | Status: DC

## 2016-01-19 MED ORDER — LIDOCAINE 2% (20 MG/ML) 5 ML SYRINGE
INTRAMUSCULAR | Status: DC | PRN
  Administered 2016-01-19: 40 mg via INTRAVENOUS

## 2016-01-19 MED ORDER — NITROGLYCERIN 0.4 MG SL SUBL: 0.4000 mg | SUBLINGUAL_TABLET | SUBLINGUAL | Status: DC | PRN

## 2016-01-19 MED ORDER — METOCLOPRAMIDE HCL 5 MG/ML IJ SOLN
10.0000 mg | Freq: Four times a day (QID) | INTRAMUSCULAR | Status: AC
  Administered 2016-01-19 – 2016-01-20 (×2): 10 mg via INTRAVENOUS
  Filled 2016-01-19 (×2): qty 2

## 2016-01-19 MED ORDER — FUROSEMIDE 10 MG/ML IJ SOLN
40.0000 mg | Freq: Once | INTRAMUSCULAR | Status: AC
  Administered 2016-01-19: 40 mg via INTRAVENOUS
  Filled 2016-01-19: qty 4

## 2016-01-19 NOTE — Interval H&P Note (Signed)
History and Physical Interval Note:  01/19/2016 8:41 AM  Shane [name]  has presented today for surgery, with the diagnosis of anemia, rectal bleeding  The various methods of treatment have been discussed with the patient and family. After consideration of risks, benefits and other options for treatment, the patient has consented to  Procedure(s): ESOPHAGOGASTRODUODENOSCOPY (EGD) (N/A) COLONOSCOPY (N/A) as a surgical intervention .  The patient's history has been reviewed, patient examined, no change in status, stable for surgery.  I have reviewed the patient's chart and labs.  Questions were answered to the patient's satisfaction.     [name]

## 2016-01-19 NOTE — H&P (View-Only) (Signed)
Referring Provider: Hosie Poisson, MD Primary Care Physician:  Minerva Ends, MD Primary Gastroenterologist:  Althia Forts   Reason for Consultation:  Iron deficiency anemia  HPI: Shane [name] is a 45 y.o. male with a history of DM2, HTN, CHF, and TIA. Was found to have cellulitis on 01/12/16 that did not respond well to conservative measures requiring surgical management yesterday, followed by wound VAC. Now found to have normocytic anemia and refuses blood transfusion as he is a Jehovah's witness.  He endorses that he has cramping abdominal, diarrhea streaked with bright red blood  3 x a month for the past 2 years, only lasting for 1 bowel movement at a time. He has also noticed 8 episodes of fecal incontinence over night for the past year, described as diarrhea.He associates fecal incontinence when he eats fried foods or corn.  His uncle had colon cancer and he feels like he needs a colonoscopy. Since the beginning of this year his PCP started him on iron supplementation for his anemia. Since then he has noticed that his bowel movements have been hard, small, round balls in between diarrhea episodes. He denies any nausea or vomiting, but foul smelling burps describes as "something died".  Has intermittent chills and night sweats for the past year. He is tired most days and worse when he get active since his diagnosis of anemia.      Past Medical History:  Diagnosis Date  . Anemia   . Anxiety   . Bipolar disorder ([place])   . Chest pain    a. 2015 Reportedly normal stress test in [place].  Marland Kitchen Chronic diastolic CHF (congestive heart failure) (HCC)    a.03/2015 Echo: EF 55-60%, Gr 1 DD, mild MR, triv PR.  . Depression   . GERD (gastroesophageal reflux disease)   . Hyperlipidemia   . Hypertension    a. 08/2014 Admitted with hypertensive urgency.  . Refusal of blood transfusions as patient is Jehovah's Witness   . Sleep apnea   . TIA (transient ischemic attack) 08/2014; 03/2015   a. 08/2014 in  setting of hypertensive urgency.  . Type II diabetes mellitus ([place])     Past Surgical History:  Procedure Laterality Date  . AMPUTATION Left 08/03/2015   Procedure: AMPUTATION LEFT GREAT TOE;  Surgeon: Newt Minion, MD;  Location: [place];  Service: Orthopedics;  Laterality: Left;  . AMPUTATION Left 12/02/2015   Procedure: amputation of left 2nd digit  . APPLICATION OF WOUND VAC Left 12/19/2015   Procedure: APPLICATION OF WOUND VAC;  Surgeon: Meredith Pel, MD;  Location: [facility];  Service: Orthopedics;  Laterality: Left;  . CIRCUMCISION    . I&D EXTREMITY Left 12/02/2015   Procedure: IRRIGATION AND DEBRIDEMENT OF FOOT; LEFT SECOND TOE AMPUTATION;  Surgeon: Meredith Pel, MD;  Location: [facility];  Service: Orthopedics;  Laterality: Left;  . I&D EXTREMITY Left 12/19/2015   Procedure: I & D LEFT FOOT WITH BEADS ;  Surgeon: Meredith Pel, MD;  Location: [facility];  Service: Orthopedics;  Laterality: Left;  . I&D EXTREMITY Right 01/17/2016   Procedure: IRRIGATION AND DEBRIDEMENT RIGHT FOOT;  Surgeon: Newt Minion, MD;  Location: [facility];  Service: Orthopedics;  Laterality: Right;  . INCISION AND DRAINAGE FOOT Right 01/17/2016  . INGUINAL HERNIA REPAIR Bilateral ~ 1983- ~ 1986  . TONSILLECTOMY  ~ 1985    Prior to Admission medications   Medication Sig Start Date End Date Taking? Authorizing Provider  aspirin EC 325 MG tablet  Take 1 tablet (325 mg total) by mouth daily. 09/04/15  Yes Maren Reamer, MD  atorvastatin (LIPITOR) 80 MG tablet Take 1 tablet (80 mg total) by mouth every morning. 08/18/15  Yes Josalyn Funches, MD  carvedilol (COREG) 12.5 MG tablet TAKE 1 TABLET BY MOUTH 2 TIMES DAILY WITH A MEAL Patient taking differently: TAKE 1 TABLET BY MOUTH IN THE MORNING. 05/17/15  Yes Josalyn Funches, MD  divalproex (DEPAKOTE) 500 MG DR tablet Take 2 tablets (1,000 mg total) by mouth at bedtime. 12/04/15  Yes Venetia Maxon Rama, MD  ferrous sulfate 325 (65 FE) MG tablet Take 1 tablet (325 mg total)  by mouth 2 (two) times daily with a meal. Patient taking differently: Take 325 mg by mouth daily with breakfast.  08/18/15  Yes Josalyn Funches, MD  FLUoxetine (PROZAC) 10 MG tablet Take 1 tablet (10 mg total) by mouth every morning. 01/02/16  Yes Oswald Hillock, MD  gabapentin (NEURONTIN) 100 MG capsule Take 1 capsule (100 mg total) by mouth 3 (three) times daily. 12/23/15  Yes Ripudeep Krystal Eaton, MD  glipiZIDE (GLUCOTROL) 10 MG tablet Take 1 tablet (10 mg total) by mouth 2 (two) times daily before a meal. Patient taking differently: Take 10 mg by mouth daily before breakfast.  08/18/15  Yes Josalyn Funches, MD  Insulin Glargine (LANTUS SOLOSTAR) 100 UNIT/ML Solostar Pen Inject 10 Units into the skin daily at 10 pm. 12/28/15  Yes Josalyn Funches, MD  lisinopril (PRINIVIL,ZESTRIL) 40 MG tablet Take 1 tablet (40 mg total) by mouth daily. 08/18/15  Yes Boykin Nearing, MD  metFORMIN (GLUCOPHAGE) 1000 MG tablet Take 1 tablet (1,000 mg total) by mouth 2 (two) times daily with a meal. 12/22/15  Yes Ripudeep K Rai, MD  omeprazole (PRILOSEC) 20 MG capsule Take 1 capsule (20 mg total) by mouth daily. 08/18/15  Yes Josalyn Funches, MD  traZODone (DESYREL) 50 MG tablet Take 0.5-1 tablets (25-50 mg total) by mouth at bedtime as needed for sleep. 11/21/15  Yes Josalyn Funches, MD  amoxicillin-clavulanate (AUGMENTIN) 875-125 MG tablet Take 1 tablet by mouth 2 (two) times daily. 01/15/16   Janece Canterbury, MD  Blood Glucose Monitoring Suppl (TRUE METRIX METER) w/Device KIT Use as directed 12/29/15   Boykin Nearing, MD  glucose blood (TRUE METRIX BLOOD GLUCOSE TEST) test strip Use as instructed 12/29/15   Josalyn Funches, MD  Insulin Pen Needle (B-D ULTRAFINE III SHORT PEN) 31G X 8 MM MISC 1 application by Does not apply route daily. 12/28/15   Boykin Nearing, MD  mupirocin ointment (BACTROBAN) 2 % Apply topically daily. Tops of feet 01/15/16   Janece Canterbury, MD  saccharomyces boulardii (FLORASTOR) 250 MG capsule Take 1 capsule (250 mg  total) by mouth 2 (two) times daily. 01/15/16   Janece Canterbury, MD  TRUEPLUS LANCETS 28G MISC Use as directed 12/29/15   Boykin Nearing, MD    Current Facility-Administered Medications  Medication Dose Route Frequency Provider Last Rate Last Dose  . 0.9 %  sodium chloride infusion   Intravenous Continuous Newt Minion, MD      . acetaminophen (TYLENOL) tablet 650 mg  650 mg Oral Q6H PRN Newt Minion, MD       Or  . acetaminophen (TYLENOL) suppository 650 mg  650 mg Rectal Q6H PRN Newt Minion, MD      . atorvastatin (LIPITOR) tablet 80 mg  80 mg Oral q1800 Etta Quill, DO   80 mg at 01/17/16 1718  . carvedilol (COREG) tablet  12.5 mg  12.5 mg Oral Q breakfast Etta Quill, DO   12.5 mg at 01/18/16 8299  . divalproex (DEPAKOTE) DR tablet 1,000 mg  1,000 mg Oral QHS Etta Quill, DO   1,000 mg at 01/17/16 2246  . enoxaparin (LOVENOX) injection 40 mg  40 mg Subcutaneous Q24H Janece Canterbury, MD   40 mg at 01/17/16 2258  . feeding supplement (GLUCERNA SHAKE) (GLUCERNA SHAKE) liquid 237 mL  237 mL Oral BID BM Janece Canterbury, MD   237 mL at 01/18/16 0941  . ferrous sulfate tablet 325 mg  325 mg Oral Q breakfast Etta Quill, DO   325 mg at 01/18/16 3716  . gabapentin (NEURONTIN) capsule 200 mg  200 mg Oral TID Janece Canterbury, MD   200 mg at 01/18/16 0940  . HYDROcodone-acetaminophen (NORCO/VICODIN) 5-325 MG per tablet 1-2 tablet  1-2 tablet Oral Q4H PRN Hosie Poisson, MD      . ibuprofen (ADVIL,MOTRIN) tablet 400 mg  400 mg Oral TID Janece Canterbury, MD   400 mg at 01/18/16 0940  . insulin aspart (novoLOG) injection 0-15 Units  0-15 Units Subcutaneous TID WC Etta Quill, DO   2 Units at 01/18/16 1304  . insulin aspart (novoLOG) injection 5 Units  5 Units Subcutaneous TID WC Janece Canterbury, MD   5 Units at 01/18/16 1304  . insulin glargine (LANTUS) injection 25 Units  25 Units Subcutaneous Q2200 Janece Canterbury, MD   25 Units at 01/17/16 2247  . lactated ringers infusion    Intravenous Continuous Newt Minion, MD 10 mL/hr at 01/17/16 0940    . linezolid (ZYVOX) IVPB 600 mg  600 mg Intravenous Q12H Truman Hayward, MD   600 mg at 01/18/16 9678  . lisinopril (PRINIVIL,ZESTRIL) tablet 40 mg  40 mg Oral Daily Etta Quill, DO   40 mg at 01/18/16 0940  . methocarbamol (ROBAXIN) tablet 500 mg  500 mg Oral Q6H PRN Newt Minion, MD   500 mg at 01/18/16 9381   Or  . methocarbamol (ROBAXIN) 500 mg in dextrose 5 % 50 mL IVPB  500 mg Intravenous Q6H PRN Newt Minion, MD      . metoCLOPramide (REGLAN) tablet 5-10 mg  5-10 mg Oral Q8H PRN Newt Minion, MD       Or  . metoCLOPramide (REGLAN) injection 5-10 mg  5-10 mg Intravenous Q8H PRN Newt Minion, MD      . multivitamin with minerals tablet 1 tablet  1 tablet Oral Daily Janece Canterbury, MD   1 tablet at 01/18/16 0940  . mupirocin ointment (BACTROBAN) 2 %   Nasal BID Newt Minion, MD      . ondansetron [facility]) tablet 4 mg  4 mg Oral Q6H PRN Newt Minion, MD       Or  . ondansetron [facility]) injection 4 mg  4 mg Intravenous Q6H PRN Newt Minion, MD      . pantoprazole (PROTONIX) EC tablet 40 mg  40 mg Oral Daily Etta Quill, DO   40 mg at 01/18/16 0940  . piperacillin-tazobactam (ZOSYN) IVPB 3.375 g  3.375 g Intravenous Q8H Donalynn Furlong Carlyss, RPH   3.375 g at 01/18/16 0175  . polyethylene glycol (MIRALAX / GLYCOLAX) packet 17 g  17 g Oral Daily Janece Canterbury, MD   17 g at 01/16/16 0854  . saccharomyces boulardii (FLORASTOR) capsule 250 mg  250 mg Oral BID Janece Canterbury, MD   250  mg at 01/18/16 0940  . senna (SENOKOT) tablet 17.2 mg  2 tablet Oral QHS Janece Canterbury, MD   17.2 mg at 01/17/16 2246  . traZODone (DESYREL) tablet 25-50 mg  25-50 mg Oral QHS PRN Etta Quill, DO   50 mg at 01/15/16 2246    Allergies as of 01/11/2016 - Review Complete 01/11/2016  Allergen Reaction Noted  . Other Other (See Comments) 07/15/2015  . Milk-related compounds Diarrhea and Other (See Comments) 02/19/2015     Family History  Problem Relation Age of Onset  . Hypertension Mother   . Diabetes Mother   . Hyperlipidemia Mother   . Heart disease Mother     s/p pacemaker  . Diabetes Father   . Hypertension Father   . Stroke Father   . Heart attack Father     first MI @ 73.  . Stroke Brother     Social History   Social History  . Marital status: Married    Spouse name: N/A  . Number of children: N/A  . Years of education: N/A   Occupational History  . Not on file.   Social History Main Topics  . Smoking status: Never Smoker  . Smokeless tobacco: Never Used  . Alcohol use No  . Drug use: No  . Sexual activity: No   Other Topics Concern  . Not on file   Social History Narrative   Lives in [place] with wife.  Active but doesn't routinely exercise.    Review of Systems: Gen: chills, tiredness, and night sweats CV: Denies chest pain Resp: Denies dyspnea GI: Denies vomiting blood, jaundice   Denies dysphagia or odynophagia. GU : Denies urinary burning, blood in urine, urinary frequency MS: Denies joint pain.  Derm: Denies rash Psych: Denies confusion or memory loss Heme: rectal bleeding Neuro:  Denies any headaches and  dizziness   Physical Exam: Vital signs in last 24 hours: Temp:  [98 F (36.7 C)-98.2 F (36.8 C)] 98 F (36.7 C) (08/03 0355) Pulse Rate:  [80-86] 80 (08/03 0355) Resp:  [16-18] 18 (08/03 0355) BP: (127-167)/(71-86) 167/86 (08/03 0355) SpO2:  [97 %-99 %] 98 % (08/03 0355) Last BM Date: 01/16/16 General:   Alert,  Well-developed, well-nourished, pleasant and cooperative in NAD Head:  Normocephalic and atraumatic. Eyes:  Sclera clear, no icterus.   Conjunctiva pallor. Ears:  Normal auditory acuity. Nose:  No deformity, discharge,  or lesions. Mouth:  No deformity or lesions.   Neck:  Supple; no masses or thyromegaly. Lungs:  Clear throughout to auscultation.   No wheezes, crackles, or rhonchi. Heart:  Regular rate and rhythm; no murmurs, clicks,  rubs,  or gallops. Abdomen: diffuse tenderness and mildly distented, BS active,nonpalp mass or hsm.   Rectal:  Deferred  Msk:  Symmetrical without gross deformities. . Pulses:  Normal pulses noted. Extremities:  Without clubbing or edema. Partial left foot amputation Neurologic:  Alert and  oriented x3;  grossly normal neurologically. Skin:  Intact without significant lesions or rashes.. Psych:  Alert and cooperative. Normal mood and affect.  Intake/Output from previous day: 08/02 0701 - 08/03 0700 In: 445 [I.V.:445] Out: 1060 [Urine:950; Drains:100; Blood:10] Intake/Output this shift: Total I/O In: -  Out: 550 [Urine:550]  Lab Results:  Recent Labs  01/16/16 0914 01/17/16 0620 01/18/16 0435  WBC 9.7 10.1 9.6  HGB 8.3* 7.3* 7.1*  HCT 26.1* 23.7* 22.8*  PLT 327 340 323   BMET  Recent Labs  01/16/16 0609 01/17/16 0620 01/18/16 0435  NA 141 138 140  K 4.3 4.1 4.7  CL 107 103 104  CO2 _0 GLUCOSE 114* 150* 130*  BUN 20 21* 20  CREATININE 1.04 1.21 1.07  CALCIUM 9.1 8.4* 8.4*   LFT No results for input(s): PROT, ALBUMIN, AST, ALT, ALKPHOS, BILITOT, BILIDIR, IBILI in the last 72 hours. PT/INR  Recent Labs  01/16/16 0914  LABPROT 13.8  INR 1.06   Hepatitis Panel  Recent Labs  01/17/16 0620  HEPBSAG Negative  HCVAB <0.1  HEPAIGM Negative  HEPBIGM Negative      Studies/Results: No results found.  IMPRESSION:  * Rectal Bleeding: appears to be present for the past 2 years. Hemoglobin is 7.1 today.  Vitals ar stable.   *Normocytic anemia: hemoglobin; 8 on 7/31>>> 6.5 on 8/1>>> today 7.1. Ferritin from 12/31/15 within normal limits. Likely secondary to rectal bleeding.   PLAN: *Discuss EGD and colonoscopy with Dr. Havery Moros.  Grayland Ormond PA-S 01/18/2016, 2:01 PM

## 2016-01-19 NOTE — Progress Notes (Signed)
Pharmacy Antibiotic Note  Shane [name] is a 45 y.o. male admitted on 01/11/2016 with a diabetic foot infection. Day #8 of abx currently on Zosyn and Zyvox. S/P I&D of right foot abscess 8/2. Found to have a large hematoma; no abscess, no purulence. Hematoma sent for cultures - NG x2 days. All other cultures neg. Wound vac in place. Remains afeb, WBC WNL.   Plan: -Continue Zyvox 600 q12  -Continue Zosyn 3.375g q8h  -Can likely de-escalated ABX   Empiric Zosyn for short term pseudomonal coverage, likely change back to unasyn per ID  No abscess/purulence found per ortho - Cultures remain neg x2 days -F/U C&S, abx deescalation / LOT   Height: 5\' 8"  (172.7 cm) (recorded on 12/30/15) Weight: 158 lb 15.9 oz (72.1 kg) (recorded on 12/30/15) IBW/kg (Calculated) : 68.4  Temp (24hrs), Avg:98.1 F (36.7 C), Min:97.5 F (36.4 C), Max:98.5 F (36.9 C)   Recent Labs Lab 01/14/16 1957  01/16/16 0609 01/16/16 0914 01/17/16 0620 01/18/16 0435 01/19/16 0000 01/19/16 0511  WBC  --   < > 10.3 9.7 10.1 9.6 10.1 8.7  CREATININE  --   < > 1.04  --  1.21 1.07 1.22 1.10  VANCOTROUGH 16  --   --   --   --   --   --   --   < > = values in this interval not displayed.  Estimated Creatinine Clearance: 81.2 mL/min (by C-G formula based on SCr of 1.1 mg/dL).    Allergies  Allergen Reactions  . Other Other (See Comments)    Red meat causes stomach pains, bloating and diarrhea  . Milk-Related Compounds Diarrhea and Other (See Comments)    Any dairy products  - diarrhea and bloating    Antimicrobials this admission: Zosyn 7/28 x1, 8/1>> CTX 7/28 x1 Flagyl 7/2 x2 Vanc 7/28 >> 8/1    7/30 VT = 16 (Continue 750mg  IV Q8) Ancef 7/28 >>7/31 Unasyn 7/31>>8/1 Zyvox 8/1>>  Microbiology results: 7/28 BCx: negF 7/28 UCx: negF 8/2 tissue/abscess cx: ngtd 8/1 surgical pcr: mrsa neg, SA +  Thank you for allowing pharmacy to be a part of this patient's care.  Stephens November, PharmD Clinical  Pharmacist 2:48 PM, 01/19/2016

## 2016-01-19 NOTE — Progress Notes (Signed)
Patient c/o of sob, oxygen is 100% on 3 L, fine crackles in bilateral bases, paged MD

## 2016-01-19 NOTE — Progress Notes (Signed)
Patient c/o of chest pain, stating it hurts more when Its pushed on, bp 172/91, heart rate of 104, paged Dr Karleen Hampshire with patients symptoms, new orders received

## 2016-01-19 NOTE — Progress Notes (Signed)
MD made aware of BNP result and BP post labetalol. Order: Once off commode, recheck BP. If SBP > 160 give one time dose IV labetalol 5mg .

## 2016-01-19 NOTE — Progress Notes (Signed)
Pt transferred off floor to CTA. VSS. Transported on 2L O2.

## 2016-01-19 NOTE — Progress Notes (Addendum)
Pharmacy Antibiotic Note  Shane Arnau is a 45 y.o. male admitted on 01/11/2016 with Diabetic foot infection.  Pharmacy has been consulted for Unasyn dosing, transitioning from Zosyn as planned per ID.  No renal adjustments necessary.  Last dose of Zosyn hung at ~3PM & will infuse over 4hr.  Plan: Complete last Zosyn dose (communicated with RN) Unasyn 3g IV q8, start at 10PM  Height: 5\' 8"  (172.7 cm) (recorded on 12/30/15) Weight: 158 lb 15.9 oz (72.1 kg) (recorded on 12/30/15) IBW/kg (Calculated) : 68.4  Temp (24hrs), Avg:98.1 F (36.7 C), Min:97.5 F (36.4 C), Max:98.5 F (36.9 C)   Recent Labs Lab 01/14/16 1957  01/16/16 0609 01/16/16 0914 01/17/16 0620 01/18/16 0435 01/19/16 0000 01/19/16 0511  WBC  --   < > 10.3 9.7 10.1 9.6 10.1 8.7  CREATININE  --   < > 1.04  --  1.21 1.07 1.22 1.10  VANCOTROUGH 16  --   --   --   --   --   --   --   < > = values in this interval not displayed.  Estimated Creatinine Clearance: 81.2 mL/min (by C-G formula based on SCr of 1.1 mg/dL).    Allergies  Allergen Reactions  . Other Other (See Comments)    Red meat causes stomach pains, bloating and diarrhea  . Milk-Related Compounds Diarrhea and Other (See Comments)    Any dairy products  - diarrhea and bloating     Thank you for allowing pharmacy to be a part of this patient's care.   Gracy Bruins, PharmD Clinical Pharmacist [facility]

## 2016-01-19 NOTE — Transfer of Care (Signed)
Immediate Anesthesia Transfer of Care Note  Patient: Shane [name]  Procedure(s) Performed: Procedure(s): ESOPHAGOGASTRODUODENOSCOPY (EGD) (N/A) COLONOSCOPY (N/A)  Patient Location: Endoscopy Unit  Anesthesia Type:MAC  Level of Consciousness: awake, alert , oriented and patient cooperative  Airway & Oxygen Therapy: Patient Spontanous Breathing and Patient connected to nasal cannula oxygen  Post-op Assessment: Report given to RN and Post -op Vital signs reviewed and stable  Post vital signs: Reviewed and stable  Last Vitals:  Vitals:   01/19/16 0634 01/19/16 0829  BP: (!) 152/78 (!) 183/93  Pulse:    Resp:  15  Temp:  36.8 C    Last Pain:  Vitals:   01/19/16 0829  TempSrc: Oral  PainSc: 8       Patients Stated Pain Goal: 5 ([id number])  Complications: No apparent anesthesia complications

## 2016-01-19 NOTE — Progress Notes (Signed)
    Called by Dr. Karleen Hampshire. To discuss this patient with atypical CP   Patient was admitted with a diabetic foot ulcer  Has had some atypical CP Pain is internittant.   Seems to be relieved with GI cocktail.  Troponin levels are negative.  ECG shows no ECG changes  Will get a stress myoview Saturday am.  Dr. Karleen Hampshire will call us back if the myoview is abnormal .     Mertie Moores, MD  01/19/2016 5:51 PM    [address],  [place], [place]  [id number] Pager [phone] Phone: [phone]; Fax: [phone]

## 2016-01-19 NOTE — Anesthesia Procedure Notes (Signed)
Procedure Name: MAC Date/Time: 01/19/2016 8:57 AM Performed by: Bethel Born Pre-anesthesia Checklist: Patient identified, Emergency Drugs available, Suction available and Patient being monitored Patient Re-evaluated:Patient Re-evaluated prior to inductionOxygen Delivery Method: Nasal cannula

## 2016-01-19 NOTE — Progress Notes (Signed)
PROGRESS NOTE    Shane [name]  JME:[id number] DOB: November 21, 1970 DOA: 01/11/2016 PCP: Minerva Ends, MD    Brief Narrative: [name] a 45 y.o.malewith medical history significant of DM2 admitted for a diabetic right foot ulcer.    Assessment & Plan:   Principal Problem:   Cellulitis of right ankle Active Problems:   Essential hypertension   Chronic anemia   Diabetic foot infection (HCC)   Cellulitis and abscess, left foot   Type 2 diabetes mellitus with vascular disease (HCC)   Cellulitis of left ankle   Diabetic ulcer of left foot associated with type 2 diabetes mellitus (HCC)   Swelling of foot joint   Anemia, iron deficiency   Hematochezia   Cellulitis and possible abscess of the right foot: S/p I&D on 8/2, resume IV antibiotics and wound vac,  Pain control.  Changed oxy to vicodin.  Improving.   Left foot ulcer stable.   Hypertension: Well controlled.  Resume lisinopril and coreg.    Iron deficient/ normocytic anemia: Low iron levels from 7/16, he is Jehovah's witness and will not take any blood products, but will use IV iron if needed.  Pt reports blood in the stools occasionally has been going on for a year now, never had any work up done with an endoscopy or colonoscopy.  Stool for occult blood ordered. Gastroenterology consulted , to see if needs EGD or colonoscopy . Plan EGD in am.   Intermittent chest pain; very atypical, ? GERD vs musculoskeletal.  Serial troponins are negative.  EKG does not show any ischemic changes.  Resolves spontaneously and sometimes with gi cocktail.  Requested cardiology for assistance.  Echocardiogram ordered.    Diabetes mellitus: CBG (last 3)   Recent Labs  01/19/16 0727 01/19/16 1223 01/19/16 1648  GLUCAP 138* 188* 137*    Resume SSI. Continue the lantus of 25 units and novolog TIDAC.  No new changes to meds.    DVT prophylaxis: (Lovenox) Code Status: (Full/ Family Communication: family at  bedside.  Disposition Plan: pending further eval.   Consultants:   Orthopedics Dr Sharol Given  ID  gastroenterology   Procedures: I&D on 8/2   Antimicrobials:  Vancomycin 7/28 > 8/1  Ceftriaxone and flagyl 7/28  Ancef 7/28 > 7/31  Unasyn 7/31 > 8/1  Zosyn 8/1 >   Linezolid 8/1 >  Subjective: Reports intermittent chest pain.   Objective: Vitals:   01/19/16 1030 01/19/16 1119 01/19/16 1222 01/19/16 1715  BP: (!) 162/91 (!) 172/91 123/72 132/79  Pulse: (!) 107 (!) 104 99 82  Resp:   18 18  Temp:      TempSrc:      SpO2: (!) 9%  94% 94%  Weight:      Height:        Intake/Output Summary (Last 24 hours) at 01/19/16 1734 Last data filed at 01/19/16 1100  Gross per 24 hour  Intake             1920 ml  Output             1100 ml  Net              820 ml   Filed Weights   01/15/16 1800  Weight: 72.1 kg (158 lb 15.9 oz)    Examination:  General exam: Appears calm and comfortable  Respiratory system: Clear to auscultation. Respiratory effort normal. Cardiovascular system: S1 & S2 heard, RRR. No JVD, murmurs, rubs, gallops or clicks. No pedal edema. Gastrointestinal  system: Abdomen is nondistended, soft and nontender. No organomegaly or masses felt. Normal bowel sounds heard. Central nervous system: Alert and oriented. No focal neurological deficits. Extremities: right lower extremity erythematous, s/p I&D , wound vac in place SLIGHTLY leaking.  Psychiatry: Judgement and insight appear normal. Mood & affect appropriate.     Data Reviewed: I have personally reviewed following labs and imaging studies  CBC:  Recent Labs Lab 01/16/16 0914 01/17/16 0620 01/18/16 0435 01/19/16 0000 01/19/16 0511  WBC 9.7 10.1 9.6 10.1 8.7  NEUTROABS 6.7  --   --   --   --   HGB 8.3* 7.3* 7.1* 7.6* 7.3*  HCT 26.1* 23.7* 22.8* 24.4* 23.4*  MCV 94.9 95.6 96.2 98.0 97.1  PLT 327 340 323 345 754   Basic Metabolic Panel:  Recent Labs Lab 01/16/16 0609 01/17/16 0620  01/18/16 0435 01/19/16 0000 01/19/16 0511  NA 141 138 140 140 140  K 4.3 4.1 4.7 4.6 4.6  CL 107 103 104 105 105  CO2 28 29 29 28 29   GLUCOSE 114* 150* 130* 239* 146*  BUN 20 21* 20 19 17   CREATININE 1.04 1.21 1.07 1.22 1.10  CALCIUM 9.1 8.4* 8.4* 8.4* 8.4*   GFR: Estimated Creatinine Clearance: 81.2 mL/min (by C-G formula based on SCr of 1.1 mg/dL). Liver Function Tests:  Recent Labs Lab 01/19/16 0000  AST 22  ALT 25  ALKPHOS 45  BILITOT 0.2*  PROT 6.9  ALBUMIN 3.1*   No results for input(s): LIPASE, AMYLASE in the last 168 hours. No results for input(s): AMMONIA in the last 168 hours. Coagulation Profile:  Recent Labs Lab 01/16/16 0914  INR 1.06   Cardiac Enzymes:  Recent Labs Lab 01/19/16 0000 01/19/16 0511 01/19/16 1105  TROPONINI <0.03 <0.03 <0.03   BNP (last 3 results) No results for input(s): PROBNP in the last 8760 hours. HbA1C: No results for input(s): HGBA1C in the last 72 hours. CBG:  Recent Labs Lab 01/18/16 1606 01/18/16 2136 01/19/16 0727 01/19/16 1223 01/19/16 1648  GLUCAP 126* 230* 138* 188* 137*   Lipid Profile: No results for input(s): CHOL, HDL, LDLCALC, TRIG, CHOLHDL, LDLDIRECT in the last 72 hours. Thyroid Function Tests: No results for input(s): TSH, T4TOTAL, FREET4, T3FREE, THYROIDAB in the last 72 hours. Anemia Panel: No results for input(s): VITAMINB12, FOLATE, FERRITIN, TIBC, IRON, RETICCTPCT in the last 72 hours. Sepsis Labs: No results for input(s): PROCALCITON, LATICACIDVEN in the last 168 hours.  Recent Results (from the past 240 hour(s))  Culture, blood (Routine X 2) w Reflex to ID Panel     Status: None   Collection Time: 01/12/16  2:45 AM  Result Value Ref Range Status   Specimen Description BLOOD RIGHT ARM  Final   Special Requests BOTTLES DRAWN AEROBIC AND ANAEROBIC 10ML  Final   Culture NO GROWTH 5 DAYS  Final   Report Status 01/17/2016 FINAL  Final  Culture, blood (Routine X 2) w Reflex to ID Panel      Status: None   Collection Time: 01/12/16  2:55 AM  Result Value Ref Range Status   Specimen Description BLOOD RIGHT ARM  Final   Special Requests IN PEDIATRIC BOTTLE 4ML  Final   Culture NO GROWTH 5 DAYS  Final   Report Status 01/17/2016 FINAL  Final  Culture, Urine     Status: None   Collection Time: 01/12/16  5:56 PM  Result Value Ref Range Status   Specimen Description URINE, RANDOM  Final   Special Requests  NONE  Final   Culture NO GROWTH  Final   Report Status 01/13/2016 FINAL  Final  Surgical pcr screen     Status: Abnormal   Collection Time: 01/16/16 11:43 PM  Result Value Ref Range Status   MRSA, PCR NEGATIVE NEGATIVE Final   Staphylococcus aureus POSITIVE (A) NEGATIVE Final    Comment:        The Xpert SA Assay (FDA approved for NASAL specimens in patients over 27 years of age), is one component of a comprehensive surveillance program.  Test performance has been validated by [facility] for patients greater than or equal to 73 year old. It is not intended to diagnose infection nor to guide or monitor treatment.   Aerobic/Anaerobic Culture (surgical/deep wound)     Status: None (Preliminary result)   Collection Time: 01/17/16 11:26 AM  Result Value Ref Range Status   Specimen Description ABSCESS RIGHT FOOT  Final   Special Requests POF ZOSYN  Final   Gram Stain   Final    MODERATE WBC PRESENT,BOTH PMN AND MONONUCLEAR NO ORGANISMS SEEN    Culture NO GROWTH 2 DAYS  Final   Report Status PENDING  Incomplete         Radiology Studies: Ct Angio Chest Pe W Or Wo Contrast  Result Date: 01/19/2016 CLINICAL DATA:  Chest pain. Status post recent amputation lower extremity. History of diabetes, hypertension. EXAM: CT ANGIOGRAPHY CHEST WITH CONTRAST TECHNIQUE: Multidetector CT imaging of the chest was performed using the standard protocol during bolus administration of intravenous contrast. Multiplanar CT image reconstructions and MIPs were obtained to evaluate the  vascular anatomy. CONTRAST:  100 cc Isovue 370 COMPARISON:  Chest radiograph January 18, 2016 FINDINGS: PULMONARY ARTERY: Adequate contrast opacification of the pulmonary artery's. Main pulmonary artery is not enlarged. No pulmonary arterial filling defects to the level of the subsegmental branches. MEDIASTINUM: Heart is mildly enlarged, no right heart strain. No pericardial effusion. Thoracic aorta is normal course and caliber, unremarkable. No lymphadenopathy by CT size criteria. LUNGS: Tracheobronchial tree is patent, no pneumothorax. Diffuse bronchial wall thickening. Interlobular septal thickening with patchy ground-glass opacities, scattered small centrilobular nodules. Small bilateral pleural effusions. Discoid atelectasis LEFT lower lobe. SOFT TISSUES AND OSSEOUS STRUCTURES: Included view of the abdomen is nonacute, and small hiatal hernia and gas distended esophagus associated with reflux disease. Visualized soft tissues and included osseous structures appear normal. Review of the MIP images confirms the above findings. IMPRESSION: No acute pulmonary embolism. Mild cardiomegaly and CT findings of pulmonary edema with small pleural effusions. Electronically Signed   By: Elon Alas M.D.   On: 01/19/2016 04:29   Dg Chest Port 1 View  Result Date: 01/19/2016 CLINICAL DATA:  Chest pain tonight. EXAM: PORTABLE CHEST 1 VIEW COMPARISON:  12/30/2015 FINDINGS: Lung volumes are low. Development of diffuse reticular opacities and peribronchial cuffing from prior exam. Linear atelectasis at both lung bases. The heart size and mediastinal contours are unchanged. No evidence of pleural effusion or pneumothorax. Unchanged osseous structures. IMPRESSION: Low lung volumes with development of diffuse reticular opacities and peribronchial cuffing suspicious for pulmonary edema. Atypical infection could have a similar appearance. Bibasilar atelectasis. Electronically Signed   By: Jeb Levering M.D.   On: 01/19/2016  00:01        Scheduled Meds: . ampicillin-sulbactam (UNASYN) IV  3 g Intravenous Q8H  . atorvastatin  80 mg Oral q1800  . carvedilol  12.5 mg Oral Q breakfast  . divalproex  1,000 mg Oral QHS  . [  START ON 01/20/2016] enoxaparin (LOVENOX) injection  40 mg Subcutaneous Q24H  . [START ON 01/20/2016] feeding supplement (GLUCERNA SHAKE)  237 mL Oral BID BM  . gabapentin  200 mg Oral TID  . insulin aspart  0-15 Units Subcutaneous TID WC  . insulin aspart  5 Units Subcutaneous TID WC  . insulin glargine  25 Units Subcutaneous Q2200  . linezolid (ZYVOX) IV  600 mg Intravenous Q12H  . lisinopril  40 mg Oral Daily  . metoCLOPramide (REGLAN) injection  10 mg Intravenous Q6H  . multivitamin with minerals  1 tablet Oral Daily  . mupirocin ointment   Nasal BID  . pantoprazole  40 mg Oral Daily  . peg 3350 powder  1 kit Oral Once  . polyethylene glycol  17 g Oral Daily  . saccharomyces boulardii  250 mg Oral BID  . senna  2 tablet Oral QHS   Continuous Infusions: . sodium chloride    . lactated ringers 10 mL/hr at 01/17/16 0940     LOS: 7 days    Time spent: 30 minutes.     Hosie Poisson, MD [facility] Pager [phone]  If 7PM-7AM, please contact night-coverage [email or web address] Password [facility] 01/19/2016, 5:34 PM

## 2016-01-19 NOTE — Progress Notes (Signed)
Pt returned to floor and placed back on telemetry.

## 2016-01-19 NOTE — Significant Event (Signed)
Rapid Response Event Note Call received per floor RN regarding Pt with chest pressure/tightness/pain. EKG in process, initial VS stable. RN request I see Patient while rounding. [facility] Provider D. Crosley text paged.  Overview: Time Called: 2200 Arrival Time: 2230 Event Type: Cardiac  Initial Focused Assessment: Pt seen at 2230, resting in bed with eyes closed, awakens easily and complains of medial  chest pain and mid back pain non radiating 8/10. EKG reviewed NSR. Alert oriented x4, MAE well except right foot weakness. Complains of blurred vision. Right foot wound vac intact. HR 87, BP 155/97, RR 15, Po2 100% on 4 LNC. Lungs clear, heart tones WNL.  ABD soft mildly distended, mild tenderness to touch only.   Interventions: RN advised to Page [facility] a third time and notify  Hospital Administrator and myself if no return call received. Pt left resting in bed, RN to call if patient worsens while awaiting MD.   2345-Dr. Hal Hope reached and orders received for labs, repeat EKG, CXR, morphine and labetalol PRN. Morphine 0.5 mg given  0045-Pt seen in bed lethargic, falls asleep easily. D.dimer elevated 2.3, trop negative,  Dr. Hal Hope paged per floor RN and updated on all labs and repeat EKG, MD to place STAT order for CTA. Pt resting, awakened to complain of pain 5/10 but drifted off to sleep easily. RN advised to monitor closely and update Provider and myself for worsening changes and results.   Plan of Care (if not transferred): Pt for CTA, Plan of care pending results. RN advised to monitor closely and update Provider and myself for worsening changes and results.   Event Summary: Name of Physician Notified: [facility] Provider paged at 2150 and 2220 per floor RN at      at       Event End Time: [place]

## 2016-01-19 NOTE — Progress Notes (Signed)
Complete EGD unable to be done due to retained food in stomach.  Colonoscopy cancelled due to same.  Will be rescheduled.  Vista Lawman, RN

## 2016-01-19 NOTE — Progress Notes (Signed)
PT Cancellation Note  Patient Details Name: Yong Zahorik MRN: [id number] DOB: 09-06-1970   Cancelled Treatment:    Reason Eval/Treat Not Completed: Medical issues which prohibited therapy. Nursing reports that patient having chest pain again, will hold PT at this time. Will continue to follow.    Cassell Clement, PT, CSCS Pager [phone] Office [phone]  01/19/2016, 12:41 PM

## 2016-01-19 NOTE — Progress Notes (Signed)
MD aware of BP: 189/99. RN to give labetalol once dose received from pharmacy.

## 2016-01-19 NOTE — Progress Notes (Signed)
Subjective: No new complaints   Antibiotics:  Anti-infectives    Start     Dose/Rate Route Frequency Ordered Stop   01/19/16 2200  Ampicillin-Sulbactam (UNASYN) 3 g in sodium chloride 0.9 % 100 mL IVPB     3 g 100 mL/hr over 60 Minutes Intravenous Every 8 hours 01/19/16 1640     01/16/16 1800  linezolid (ZYVOX) IVPB 600 mg     600 mg 300 mL/hr over 60 Minutes Intravenous Every 12 hours 01/16/16 1608     01/16/16 1000  piperacillin-tazobactam (ZOSYN) IVPB 3.375 g  Status:  Discontinued     3.375 g 12.5 mL/hr over 240 Minutes Intravenous Every 8 hours 01/16/16 0850 01/19/16 1628   01/15/16 1930  ampicillin-sulbactam (UNASYN) 1.5 g in sodium chloride 0.9 % 50 mL IVPB  Status:  Discontinued     1.5 g 100 mL/hr over 30 Minutes Intravenous Every 6 hours 01/15/16 1841 01/16/16 0849   01/15/16 0000  amoxicillin-clavulanate (AUGMENTIN) 875-125 MG tablet     1 tablet Oral 2 times daily 01/15/16 1629     01/12/16 1700  ceFAZolin (ANCEF) IVPB 1 g/50 mL premix  Status:  Discontinued     1 g 100 mL/hr over 30 Minutes Intravenous Every 8 hours 01/12/16 1624 01/15/16 1808   01/12/16 1200  vancomycin (VANCOCIN) IVPB 750 mg/150 ml premix  Status:  Discontinued     750 mg 150 mL/hr over 60 Minutes Intravenous Every 8 hours 01/12/16 0422 01/16/16 1608   01/12/16 0600  metroNIDAZOLE (FLAGYL) tablet 500 mg  Status:  Discontinued     500 mg Oral Every 8 hours 01/12/16 0413 01/12/16 1613   01/12/16 0500  cefTRIAXone (ROCEPHIN) 2 g in dextrose 5 % 50 mL IVPB  Status:  Discontinued     2 g 100 mL/hr over 30 Minutes Intravenous Every 24 hours 01/12/16 0413 01/12/16 1613   01/12/16 0245  vancomycin (VANCOCIN) IVPB 1000 mg/200 mL premix     1,000 mg 200 mL/hr over 60 Minutes Intravenous  Once 01/12/16 0238 01/12/16 0426   01/12/16 0245  piperacillin-tazobactam (ZOSYN) IVPB 3.375 g     3.375 g 100 mL/hr over 30 Minutes Intravenous  Once 01/12/16 0238 01/12/16 0343       Medications: Scheduled Meds: . ampicillin-sulbactam (UNASYN) IV  3 g Intravenous Q8H  . atorvastatin  80 mg Oral q1800  . carvedilol  12.5 mg Oral Q breakfast  . divalproex  1,000 mg Oral QHS  . [START ON 01/20/2016] enoxaparin (LOVENOX) injection  40 mg Subcutaneous Q24H  . [START ON 01/20/2016] feeding supplement (GLUCERNA SHAKE)  237 mL Oral BID BM  . gabapentin  200 mg Oral TID  . insulin aspart  0-15 Units Subcutaneous TID WC  . insulin aspart  5 Units Subcutaneous TID WC  . insulin glargine  25 Units Subcutaneous Q2200  . linezolid (ZYVOX) IV  600 mg Intravenous Q12H  . lisinopril  40 mg Oral Daily  . metoCLOPramide (REGLAN) injection  10 mg Intravenous Q6H  . multivitamin with minerals  1 tablet Oral Daily  . mupirocin ointment   Nasal BID  . pantoprazole  40 mg Oral Daily  . polyethylene glycol  17 g Oral Daily  . saccharomyces boulardii  250 mg Oral BID  . senna  2 tablet Oral QHS   Continuous Infusions: . sodium chloride    . lactated ringers 10 mL/hr at 01/17/16 0940   PRN Meds:.acetaminophen **OR** acetaminophen, gi cocktail, HYDROcodone-acetaminophen,  labetalol, methocarbamol **OR** methocarbamol (ROBAXIN)  IV, metoCLOPramide **OR** metoCLOPramide (REGLAN) injection, nitroGLYCERIN, ondansetron **OR** ondansetron (ZOFRAN) IV, traZODone    Objective: Weight change:   Intake/Output Summary (Last 24 hours) at 01/19/16 1831 Last data filed at 01/19/16 1530  Gross per 24 hour  Intake             2490 ml  Output             1100 ml  Net             1390 ml   Blood pressure 132/79, pulse 82, temperature 98.3 F (36.8 C), temperature source Oral, resp. rate 18, height 5\' 8"  (1.727 m), weight 158 lb 15.9 oz (72.1 kg), SpO2 94 %. Temp:  [97.5 F (36.4 C)-98.5 F (36.9 C)] 98.3 F (36.8 C) (08/04 0829) Pulse Rate:  [81-107] 82 (08/04 1715) Resp:  [15-18] 18 (08/04 1715) BP: (123-189)/(72-109) 132/79 (08/04 1715) SpO2:  [9 %-100 %] 94 % (08/04 1715)  Physical  Exam: General: Alert and awake, oriented x3, not in any acute distress. HEENT: anicteric sclera, , EOMI CVS regular rate, normal r,  no murmur rubs or gallops Chest: , no wheezingAbdomen: soft nontender Skin: vacuum dressing on the right Neuro: nonfocal  CBC:  CBC Latest Ref Rng & Units 01/19/2016 01/19/2016 01/18/2016  WBC 4.0 - 10.5 K/uL 8.7 10.1 9.6  Hemoglobin 13.0 - 17.0 g/dL 7.3(L) 7.6(L) 7.1(L)  Hematocrit 39.0 - 52.0 % 23.4(L) 24.4(L) 22.8(L)  Platelets 150 - 400 K/uL 343 345 323     BMET  Recent Labs  01/19/16 0000 01/19/16 0511  NA 140 140  K 4.6 4.6  CL 105 105  CO2 28 29  GLUCOSE 239* 146*  BUN 19 17  CREATININE 1.22 1.10  CALCIUM 8.4* 8.4*     Liver Panel   Recent Labs  01/19/16 0000  PROT 6.9  ALBUMIN 3.1*  AST 22  ALT 25  ALKPHOS 45  BILITOT 0.2*       Sedimentation Rate No results for input(s): ESRSEDRATE in the last 72 hours. C-Reactive Protein No results for input(s): CRP in the last 72 hours.  Micro Results: Recent Results (from the past 720 hour(s))  Culture, blood (Routine X 2) w Reflex to ID Panel     Status: None   Collection Time: 01/12/16  2:45 AM  Result Value Ref Range Status   Specimen Description BLOOD RIGHT ARM  Final   Special Requests BOTTLES DRAWN AEROBIC AND ANAEROBIC 10ML  Final   Culture NO GROWTH 5 DAYS  Final   Report Status 01/17/2016 FINAL  Final  Culture, blood (Routine X 2) w Reflex to ID Panel     Status: None   Collection Time: 01/12/16  2:55 AM  Result Value Ref Range Status   Specimen Description BLOOD RIGHT ARM  Final   Special Requests IN PEDIATRIC BOTTLE 4ML  Final   Culture NO GROWTH 5 DAYS  Final   Report Status 01/17/2016 FINAL  Final  Culture, Urine     Status: None   Collection Time: 01/12/16  5:56 PM  Result Value Ref Range Status   Specimen Description URINE, RANDOM  Final   Special Requests NONE  Final   Culture NO GROWTH  Final   Report Status 01/13/2016 FINAL  Final  Surgical pcr  screen     Status: Abnormal   Collection Time: 01/16/16 11:43 PM  Result Value Ref Range Status   MRSA, PCR NEGATIVE NEGATIVE Final  Staphylococcus aureus POSITIVE (A) NEGATIVE Final    Comment:        The Xpert SA Assay (FDA approved for NASAL specimens in patients over 9 years of age), is one component of a comprehensive surveillance program.  Test performance has been validated by [facility] for patients greater than or equal to 81 year old. It is not intended to diagnose infection nor to guide or monitor treatment.   Aerobic/Anaerobic Culture (surgical/deep wound)     Status: None (Preliminary result)   Collection Time: 01/17/16 11:26 AM  Result Value Ref Range Status   Specimen Description ABSCESS RIGHT FOOT  Final   Special Requests POF ZOSYN  Final   Gram Stain   Final    MODERATE WBC PRESENT,BOTH PMN AND MONONUCLEAR NO ORGANISMS SEEN    Culture NO GROWTH 2 DAYS  Final   Report Status PENDING  Incomplete    Studies/Results: Ct Angio Chest Pe W Or Wo Contrast  Result Date: 01/19/2016 CLINICAL DATA:  Chest pain. Status post recent amputation lower extremity. History of diabetes, hypertension. EXAM: CT ANGIOGRAPHY CHEST WITH CONTRAST TECHNIQUE: Multidetector CT imaging of the chest was performed using the standard protocol during bolus administration of intravenous contrast. Multiplanar CT image reconstructions and MIPs were obtained to evaluate the vascular anatomy. CONTRAST:  100 cc Isovue 370 COMPARISON:  Chest radiograph January 18, 2016 FINDINGS: PULMONARY ARTERY: Adequate contrast opacification of the pulmonary artery's. Main pulmonary artery is not enlarged. No pulmonary arterial filling defects to the level of the subsegmental branches. MEDIASTINUM: Heart is mildly enlarged, no right heart strain. No pericardial effusion. Thoracic aorta is normal course and caliber, unremarkable. No lymphadenopathy by CT size criteria. LUNGS: Tracheobronchial tree is patent, no  pneumothorax. Diffuse bronchial wall thickening. Interlobular septal thickening with patchy ground-glass opacities, scattered small centrilobular nodules. Small bilateral pleural effusions. Discoid atelectasis LEFT lower lobe. SOFT TISSUES AND OSSEOUS STRUCTURES: Included view of the abdomen is nonacute, and small hiatal hernia and gas distended esophagus associated with reflux disease. Visualized soft tissues and included osseous structures appear normal. Review of the MIP images confirms the above findings. IMPRESSION: No acute pulmonary embolism. Mild cardiomegaly and CT findings of pulmonary edema with small pleural effusions. Electronically Signed   By: Elon Alas M.D.   On: 01/19/2016 04:29   Dg Chest Port 1 View  Result Date: 01/19/2016 CLINICAL DATA:  Chest pain tonight. EXAM: PORTABLE CHEST 1 VIEW COMPARISON:  12/30/2015 FINDINGS: Lung volumes are low. Development of diffuse reticular opacities and peribronchial cuffing from prior exam. Linear atelectasis at both lung bases. The heart size and mediastinal contours are unchanged. No evidence of pleural effusion or pneumothorax. Unchanged osseous structures. IMPRESSION: Low lung volumes with development of diffuse reticular opacities and peribronchial cuffing suspicious for pulmonary edema. Atypical infection could have a similar appearance. Bibasilar atelectasis. Electronically Signed   By: Jeb Levering M.D.   On: 01/19/2016 00:01      Assessment/Plan:  INTERVAL HISTORY:   Sp surgery by Dr. Sharol Given  Sp EGD today   Principal Problem:   Cellulitis of right ankle Active Problems:   Essential hypertension   Chronic anemia   Diabetic foot infection ([place])   Cellulitis and abscess, left foot   Type 2 diabetes mellitus with vascular disease ([place])   Cellulitis of left ankle   Diabetic ulcer of left foot associated with type 2 diabetes mellitus (HCC)   Swelling of foot joint   Anemia, iron deficiency   Hematochezia    Christean Grief  Nolette is a 45 y.o. male with  Diabetic foot infection and progression of his soft tissue infection despite appropriate broad spectrum antimicrobials now improved after I and D of foot abscess  #1 DFU with abscess sp I and D: cultures unrevealing  --narrowing to unasyn and zyvox  Dr. Megan Salon available on the weekend for questions.      LOS: 7 days   Alcide Evener 01/19/2016, 6:31 PM

## 2016-01-19 NOTE — Op Note (Signed)
[facility] Patient Name: Shane [name] Date : 01/19/2016 MRN: [id number] Attending MD: Shane [name]. Shane [name] , MD Date of Birth: 1970/12/18 CSN: [id number] Age: 45 Admit Type: Inpatient [name]:                Upper GI endoscopy Indications:              Iron deficiency anemia Providers:                Shane Lipps P. Shane Moros, MD, Shane Lawman, RN, Shane [name], Technician, Shane Argue CRNA, CRNA Referring MD:              Medicines:                Monitored Anesthesia Care Complications:            No immediate complications. Estimated blood loss:                            None. Estimated Blood Loss:     Estimated blood loss: none. [name]:                Pre-Anesthesia Assessment:                           - Prior to the [name], a History and Physical                            was performed, and patient medications and                            allergies were reviewed. The patient's tolerance of                            previous anesthesia was also reviewed. The risks                            and benefits of the [name] and the sedation                            options and risks were discussed with the patient.                            All questions were answered, and informed consent                            was obtained. Prior Anticoagulants: The patient has                            taken no previous anticoagulant or antiplatelet                            agents. ASA Grade Assessment: III - A patient with  severe systemic disease. After reviewing the risks                            and benefits, the patient was deemed in                            satisfactory condition to undergo the [name].                           After obtaining informed consent, the endoscope was                            passed under direct vision. Throughout the   [name], the patient's blood pressure, pulse, and                            oxygen saturations were monitored continuously. The                            [id number]) scope was introduced through the                            mouth, and advanced to the antrum of the stomach.                            The upper GI endoscopy was accomplished without                            difficulty. The patient tolerated the [name]                            well. Scope In: Scope Out: Findings:      The examined esophagus was normal.      A large amount of food (residue) was found in the entire examined       stomach, concerning for underlying gastroparesis. [name] aborted in       this light and colonoscopy cancelled. Impression:               - Normal esophagus.                           - A large amount of food (residue) in the stomach,                            concerning for gastroparesis. Moderate Sedation:      No moderate sedation, case performed with MAC Recommendation:           - Return patient to hospital ward for ongoing care.                           - Clear liquid diet today                           - Repeat bowel prep this this evening                           -  Trial of reglan today                           - Repeat EGD and colonoscopy tomorrow                           - Continue present medications [name] Code(s):        --- Professional ---                           [phone], 52, Esophagogastroduodenoscopy, flexible,                            transoral; diagnostic, including collection of                            specimen(s) by brushing or washing, when performed                            (separate [name]) Diagnosis Code(s):        --- Professional ---                           D50.9, Iron deficiency anemia, unspecified CPT copyright 2016 [employer]. All rights reserved. The codes documented in this report are preliminary and upon  coder review may  be revised to meet current compliance requirements. Shane Lipps P. Brielynn Sekula, MD 01/19/2016 9:23:06 AM This report has been signed electronically. Number of Addenda: 0

## 2016-01-19 NOTE — Anesthesia Postprocedure Evaluation (Signed)
Anesthesia Post Note  Patient: Shane [name]  Procedure(s) Performed: Procedure(s) (LRB): ESOPHAGOGASTRODUODENOSCOPY (EGD) (N/A) CANCELLED PROCEDURE  Patient location during evaluation: PACU Anesthesia Type: MAC Level of consciousness: awake and alert Pain management: pain level controlled Vital Signs Assessment: post-procedure vital signs reviewed and stable Respiratory status: spontaneous breathing, nonlabored ventilation, respiratory function stable and patient connected to nasal cannula oxygen Cardiovascular status: stable and blood pressure returned to baseline Anesthetic complications: no    Last Vitals:  Vitals:   01/19/16 1119 01/19/16 1222  BP: (!) 172/91 123/72  Pulse: (!) 104 99  Resp:  18  Temp:      Last Pain:  Vitals:   01/19/16 1032  TempSrc:   PainSc: 6                  Amiliah Campisi J

## 2016-01-19 NOTE — Anesthesia Preprocedure Evaluation (Signed)
Anesthesia Evaluation  Patient identified by MRN, date of birth, ID band Patient awake    Airway Mallampati: I  TM Distance: >3 FB Neck ROM: Full    Dental  (+) Teeth Intact   Pulmonary sleep apnea ,    breath sounds clear to auscultation       Cardiovascular hypertension, + Peripheral Vascular Disease and +CHF   Rhythm:Regular Rate:Normal     Neuro/Psych PSYCHIATRIC DISORDERS Anxiety Bipolar Disorder TIA   GI/Hepatic GERD  ,  Endo/Other  diabetes  Renal/GU Renal disease     Musculoskeletal   Abdominal (+) + obese,   Peds  Hematology  (+) anemia , REFUSES BLOOD PRODUCTS,   Anesthesia Other Findings   Reproductive/Obstetrics                             Anesthesia Physical  Anesthesia Plan  ASA: III  Anesthesia Plan: MAC   Post-op Pain Management:    Induction: Intravenous  Airway Management Planned: Nasal Cannula  Additional Equipment:   Intra-op Plan:   Post-operative Plan: Extubation in OR  Informed Consent: I have reviewed the patients History and Physical, chart, labs and discussed the procedure including the risks, benefits and alternatives for the proposed anesthesia with the patient or authorized representative who has indicated his/her understanding and acceptance.     Plan Discussed with: CRNA  Anesthesia Plan Comments:         Anesthesia Quick Evaluation

## 2016-01-20 ENCOUNTER — Encounter (HOSPITAL_COMMUNITY)
Admission: EM | Disposition: A | Discharge status: Home Health | Payer: Self-pay | Source: Home / Self Care | Attending: Internal Medicine

## 2016-01-20 ENCOUNTER — Encounter (HOSPITAL_COMMUNITY): Payer: Self-pay

## 2016-01-20 ENCOUNTER — Inpatient Hospital Stay (HOSPITAL_COMMUNITY): Payer: Medicaid Other

## 2016-01-20 DIAGNOSIS — R079 Chest pain, unspecified: Secondary | ICD-10-CM

## 2016-01-20 LAB — ECHOCARDIOGRAM COMPLETE
Height: 68 in
WEIGHTICAEL: 2543.93 [oz_av]

## 2016-01-20 LAB — BASIC METABOLIC PANEL
Anion gap: 6 (ref 5–15)
BUN: 8 mg/dL (ref 6–20)
CHLORIDE: 109 mmol/L (ref 101–111)
CO2: 28 mmol/L (ref 22–32)
Calcium: 8.7 mg/dL — ABNORMAL LOW (ref 8.9–10.3)
Creatinine, Ser: 1.02 mg/dL (ref 0.61–1.24)
GFR calc Af Amer: >60 mL/min (ref >60)
GFR calc non Af Amer: >60 mL/min (ref >60)
GLUCOSE: 92 mg/dL (ref 65–99)
POTASSIUM: 3.8 mmol/L (ref 3.5–5.1)
Sodium: 143 mmol/L (ref 135–145)

## 2016-01-20 LAB — NM MYOCAR MULTI W/SPECT W/WALL MOTION / EF
CSEPEW: 1 METS
CSEPPHR: 114 {beats}/min
Exercise duration (min): 0 min
Exercise duration (sec): 0 s
MPHR: 174 {beats}/min
Percent HR: 65 %
Rest HR: 100 {beats}/min

## 2016-01-20 LAB — CBC
HEMATOCRIT: 22.4 % — AB (ref 39.0–52.0)
Hemoglobin: 7 g/dL — ABNORMAL LOW (ref 13.0–17.0)
MCH: 30.2 pg (ref 26.0–34.0)
MCHC: 31.3 g/dL (ref 30.0–36.0)
MCV: 96.6 fL (ref 78.0–100.0)
Platelets: 358 10*3/uL (ref 150–400)
RBC: 2.32 MIL/uL — ABNORMAL LOW (ref 4.22–5.81)
RDW: 14.6 % (ref 11.5–15.5)
WBC: 8.5 10*3/uL (ref 4.0–10.5)

## 2016-01-20 LAB — GLUCOSE, CAPILLARY
GLUCOSE-CAPILLARY: 82 mg/dL (ref 65–99)
GLUCOSE-CAPILLARY: 205 mg/dL — AB (ref 65–99)
Glucose-Capillary: 87 mg/dL (ref 65–99)

## 2016-01-20 SURGERY — CANCELLED PROCEDURE

## 2016-01-20 MED ORDER — REGADENOSON 0.4 MG/5ML IV SOLN
INTRAVENOUS | Status: AC
  Administered 2016-01-20: 0.4 mg via INTRAVENOUS
  Filled 2016-01-20: qty 5

## 2016-01-20 MED ORDER — TECHNETIUM TC 99M TETROFOSMIN IV KIT
10.0000 | PACK | Freq: Once | INTRAVENOUS | Status: AC | PRN
  Administered 2016-01-20: 10 via INTRAVENOUS

## 2016-01-20 MED ORDER — FUROSEMIDE 10 MG/ML IJ SOLN
40.0000 mg | Freq: Every day | INTRAMUSCULAR | Status: DC
  Administered 2016-01-20 – 2016-01-21 (×2): 40 mg via INTRAVENOUS
  Filled 2016-01-20 (×2): qty 4

## 2016-01-20 MED ORDER — TECHNETIUM TC 99M TETROFOSMIN IV KIT
30.0000 | PACK | Freq: Once | INTRAVENOUS | Status: AC | PRN
  Administered 2016-01-20: 30 via INTRAVENOUS

## 2016-01-20 MED ORDER — HYDROCODONE-ACETAMINOPHEN 5-325 MG PO TABS
1.0000 | ORAL_TABLET | Freq: Four times a day (QID) | ORAL | Status: DC | PRN
  Administered 2016-01-20 – 2016-01-23 (×5): 1 via ORAL
  Filled 2016-01-20 (×5): qty 1

## 2016-01-20 MED ORDER — REGADENOSON 0.4 MG/5ML IV SOLN
0.4000 mg | Freq: Once | INTRAVENOUS | Status: AC
  Administered 2016-01-20: 0.4 mg via INTRAVENOUS
  Filled 2016-01-20: qty 5

## 2016-01-20 NOTE — Progress Notes (Signed)
Patient attached to monitor.  O2 saturation was found to be 71%  on room air.  Oxygen applied via [place].  Pt now on 6L and a saturation of 98% MD at bedside and spoke with patient about cancelling for now d/t safety concerns with his heart and oxygen levels.  Patient stated understanding.  Report called to nurse on floor and patient returned to unit.

## 2016-01-20 NOTE — Progress Notes (Signed)
GI UPDATE:  See my prior note from this AM for details of his case. New oxygen requirement overnight, appears volume overloaded and getting lasix. Also with recent chest pain and undergoing stress test and echocardiogram today. Given these issues his EGD / colonscopy was cancelled this morning.   Spoke with Dr. Karleen Hampshire about his case. I think we can give him a clear liquid diet this afternoon and reassess him in the morning. Pending his stress test / echo result and his response to lasix, we may be able to perform his procedures tomorrow. If he has an active cardiac issue we will have to assess when he is ready for endoscopy. Would keep on clear liquids today / tonight in case we proceed with procedures tomorrow.   Sims Cellar, MD [facility] Gastroenterology Pager [phone]

## 2016-01-20 NOTE — Progress Notes (Signed)
  Echocardiogram 2D Echocardiogram has been performed.  Jennette Dubin 01/20/2016, 2:51 PM

## 2016-01-20 NOTE — Progress Notes (Signed)
Progress Note   Subjective  Patient had episode of chest pain last night. CT ruled out PE, cardiology consulted. CT shows some pulmonary edema with pleural effusions. He is scheduled for a stress myoview this AM. HE denies chest pain currently but noted to have hypoxia in endoscopy requiring supplemental oxygen   Objective   Vital signs in last 24 hours: Temp:  [98.3 F (36.8 C)-99.5 F (37.5 C)] 99.5 F (37.5 C) (08/05 0514) Pulse Rate:  [82-121] 121 (08/05 0514) Resp:  [14-18] 14 (08/05 0514) BP: (123-186)/(72-109) 153/84 (08/05 0514) SpO2:  [9 %-98 %] 98 % (08/05 0719) Last BM Date: 01/19/16 General:    AA male in NAD Heart:  Tachycardia, regular ; no murmurs Lungs: Respirations even and unlabored, decreased BS at bases B Abdomen:  Soft, nontender and nondistended. Normal bowel sounds. Extremities:  Without edema. Neurologic:  Alert and oriented,  grossly normal neurologically. Psych:  Cooperative. Normal mood and affect.  Intake/Output from previous day: 08/04 0701 - 08/05 0700 In: [id number] [P.O.:570; I.V.:300] Out: 325 [Urine:325] Intake/Output this shift: No intake/output data recorded.  Lab Results:  Recent Labs  01/19/16 0000 01/19/16 0511 01/20/16 0504  WBC 10.1 8.7 8.5  HGB 7.6* 7.3* 7.0*  HCT 24.4* 23.4* 22.4*  PLT 345 343 358   BMET  Recent Labs  01/19/16 0000 01/19/16 0511 01/20/16 0504  NA 140 140 143  K 4.6 4.6 3.8  CL 105 105 109  CO2 28 29 28   GLUCOSE 239* 146* 92  BUN 19 17 8   CREATININE 1.22 1.10 1.02  CALCIUM 8.4* 8.4* 8.7*   LFT  Recent Labs  01/19/16 0000  PROT 6.9  ALBUMIN 3.1*  AST 22  ALT 25  ALKPHOS 45  BILITOT 0.2*   PT/INR No results for input(s): LABPROT, INR in the last 72 hours.  Studies/Results: Ct Angio Chest Pe W Or Wo Contrast  Result Date: 01/19/2016 CLINICAL DATA:  Chest pain. Status post recent amputation lower extremity. History of diabetes, hypertension. EXAM: CT ANGIOGRAPHY CHEST WITH CONTRAST  TECHNIQUE: Multidetector CT imaging of the chest was performed using the standard protocol during bolus administration of intravenous contrast. Multiplanar CT image reconstructions and MIPs were obtained to evaluate the vascular anatomy. CONTRAST:  100 cc Isovue 370 COMPARISON:  Chest radiograph January 18, 2016 FINDINGS: PULMONARY ARTERY: Adequate contrast opacification of the pulmonary artery's. Main pulmonary artery is not enlarged. No pulmonary arterial filling defects to the level of the subsegmental branches. MEDIASTINUM: Heart is mildly enlarged, no right heart strain. No pericardial effusion. Thoracic aorta is normal course and caliber, unremarkable. No lymphadenopathy by CT size criteria. LUNGS: Tracheobronchial tree is patent, no pneumothorax. Diffuse bronchial wall thickening. Interlobular septal thickening with patchy ground-glass opacities, scattered small centrilobular nodules. Small bilateral pleural effusions. Discoid atelectasis LEFT lower lobe. SOFT TISSUES AND OSSEOUS STRUCTURES: Included view of the abdomen is nonacute, and small hiatal hernia and gas distended esophagus associated with reflux disease. Visualized soft tissues and included osseous structures appear normal. Review of the MIP images confirms the above findings. IMPRESSION: No acute pulmonary embolism. Mild cardiomegaly and CT findings of pulmonary edema with small pleural effusions. Electronically Signed   By: Elon Alas M.D.   On: 01/19/2016 04:29   Dg Chest Port 1 View  Result Date: 01/19/2016 CLINICAL DATA:  Chest pain tonight. EXAM: PORTABLE CHEST 1 VIEW COMPARISON:  12/30/2015 FINDINGS: Lung volumes are low. Development of diffuse reticular opacities and peribronchial cuffing from prior exam. Linear atelectasis  at both lung bases. The heart size and mediastinal contours are unchanged. No evidence of pleural effusion or pneumothorax. Unchanged osseous structures. IMPRESSION: Low lung volumes with development of diffuse  reticular opacities and peribronchial cuffing suspicious for pulmonary edema. Atypical infection could have a similar appearance. Bibasilar atelectasis. Electronically Signed   By: Jeb Levering M.D.   On: 01/19/2016 00:01       Assessment / Plan:   45 y/o male Jahova's witness with normocytic anemia progressing to Hgb of 6-7s. He is declining blood transfusions. Iron levels in July were low, with normal ferritin at that time. He has some longstanding intermittent rectal bleeding, and rare "dark stools"  His EGD was aborted yesterday given significant retention of food in the stomach, with aspiration risk. He had another bowel prep yesterday in preparation for EGD / colonoscopy today. Overnight events noted, cardiology recommending stress myoview. He has a new oxygen requirement.   At this time, recommend he hold off on EGD / colonoscopy until his cardiac workup is complete. If stress test is normal this AM, pending his status we may be able to proceed with EGD and colonoscopy later today. Please keep him NPO in this light and we will reassess him later.    Cellar, MD [facility] Gastroenterology Pager [phone]

## 2016-01-20 NOTE — Progress Notes (Addendum)
IMPRESSION: 1. No reversible ischemia or infarction.  2. Normal left ventricular wall motion. Mild LEFT ventricular dilatation  3. Left ventricular ejection fraction 44%  4. Non invasive risk stratification*: Low  Myoview low risk. Despite having mildly low EF on myoview, but his echo on 12/11/2015 showed normal EF. Myoview tend to over or underestimate true EF.  Addendum: Echo today shows normal EF, echo is more accurate in assess EF than myoview.   Hilbert Corrigan PA Pager: [phone]

## 2016-01-20 NOTE — Progress Notes (Signed)
PT Cancellation Note  Patient Details Name: Shane [name] MRN: [id number] DOB: 1971/05/27   Cancelled Treatment:    Reason Eval/Treat Not Completed: Medical issues which prohibited therapy;Patient at procedure or test/unavailable. Will check back this afternoon.    [place], [place] 01/20/2016, 11:34 AM

## 2016-01-20 NOTE — Progress Notes (Signed)
PROGRESS NOTE    Shane [name]  [id number] DOB: [birth year] DOA: 01/11/2016 PCP: Shane Ends, MD    Brief Narrative: Shane [name] a 45 y.o.malewith medical history significant of DM2 admitted for a diabetic right foot ulcer. He underwent I&D on 8/2 and was to be discharged the next day. But his hemoglobin started dropping and GI consulted for evaluation of iron deficiency anemia. On 8/4 pt started having  Intermittent substernal chest pain resolved with GI cocktail and sometimes spontaneously. Stress test was ordered for 8/5 .    Assessment & Plan:   Principal Problem:   Cellulitis of right ankle Active Problems:   Essential hypertension   Chronic anemia   Diabetic foot infection (HCC)   Cellulitis and abscess, left foot   Type 2 diabetes mellitus with vascular disease (HCC)   Cellulitis of left ankle   Diabetic ulcer of left foot associated with type 2 diabetes mellitus (HCC)   Swelling of foot joint   Anemia, iron deficiency   Hematochezia   Cellulitis and possible abscess of the right foot: S/p I&D on 8/2, resume IV antibiotics and wound vac,  Pain control. Improving.   Left foot ulcer stable.   Hypertension: Well controlled.  Resume lisinopril and coreg.    Iron deficient/ normocytic anemia: Low iron levels from 7/16, he is Jehovah's witness and will not take any blood products, but will use IV iron if needed.  Pt reports blood in the stools occasionally has been going on for a year now, never had any work up done with an endoscopy or colonoscopy.  Stool for occult blood ordered. Gastroenterology consulted , to see if needs EGD or colonoscopy .  Hemoglobin this am is 7.   Intermittent chest pain; very atypical, ? GERD vs musculoskeletal.  Serial troponins are negative.  EKG does not show any ischemic changes.  Resolves spontaneously and sometimes with gi cocktail.  Requested cardiology for assistance.  Echocardiogram ordered.  Stress test in am.    NPO after midnight, and if stress is negative , can go for the EGD and colonoscopy later on today.    Diabetes mellitus: CBG (last 3)   Recent Labs  01/19/16 1648 01/19/16 2227 01/20/16 0816  GLUCAP 137* 112* 87    Resume SSI. Continue the lantus of 25 units and novolog TIDAC.  No new changes to meds.   Hypoxia/ acute respiratory failure with hypoxia: CT chest shows pulmonary edema, lasix 40 mg IV ordered on 8/4, repeat CXR today . D dimer was elevated and CT angio ruled out pulmonary embolism, elevated bnp 131.   Currently requiring up to 6 lt of [place] oxygen.  ? Chf, serial troponins negative, and echocardiogram pending.  No pedal edema.  Continue to monitor.    DVT prophylaxis: (Lovenox) Code Status: (Full/ Family Communication: family at bedside.  Disposition Plan: pending further eval.   Consultants:   Orthopedics Dr Shane [name]  ID  Gastroenterology  Dr Shane [name]   Procedures: I&D on 8/2   Antimicrobials:  Vancomycin 7/28 > 8/1  Ceftriaxone and flagyl 7/28  Ancef 7/28 > 7/31  Unasyn 7/31 > 8/1  Zosyn 8/1 >   Linezolid 8/1 >  Subjective: Sleepy, reports having a sleepless night.   Objective: Vitals:   01/19/16 2110 01/20/16 0514 01/20/16 0710 01/20/16 0719  BP: (!) 171/95 (!) 153/84    Pulse: (!) 112 (!) 121    Resp: 16 14    Temp: 98.4 F (36.9 C) 99.5 F (37.5 C)  TempSrc: Axillary Axillary    SpO2: (!) 86% (!) 88% (!) 71% 98%  Weight:      Height:        Intake/Output Summary (Last 24 hours) at 01/20/16 1013 Last data filed at 01/19/16 1530  Gross per 24 hour  Intake              450 ml  Output              325 ml  Net              125 ml   Filed Weights   01/15/16 1800  Weight: 72.1 kg (158 lb 15.9 oz)    Examination:  General exam: Appears calm and comfortable on 5 lit of [place] oxygen.  Respiratory system: Clear to auscultation. No wheezing or rhonchi.  Cardiovascular system: S1 & S2 heard, RRR. No JVD, murmurs, rubs, gallops  or clicks. No pedal edema. Gastrointestinal system: Abdomen is nondistended, soft and nontender. No organomegaly or masses felt. Normal bowel sounds heard. Central nervous system: Alert and oriented. No focal neurological deficits. Extremities: right lower extremity s/p I&D , wound vac in place, no leakage.      Data Reviewed: I have personally reviewed following labs and imaging studies  CBC:  Recent Labs Lab 01/16/16 0914 01/17/16 0620 01/18/16 0435 01/19/16 0000 01/19/16 0511 01/20/16 0504  WBC 9.7 10.1 9.6 10.1 8.7 8.5  NEUTROABS 6.7  --   --   --   --   --   HGB 8.3* 7.3* 7.1* 7.6* 7.3* 7.0*  HCT 26.1* 23.7* 22.8* 24.4* 23.4* 22.4*  MCV 94.9 95.6 96.2 98.0 97.1 96.6  PLT 327 340 323 345 343 [id number]   Basic Metabolic Panel:  Recent Labs Lab 01/17/16 0620 01/18/16 0435 01/19/16 0000 01/19/16 0511 01/20/16 0504  NA 138 140 140 140 143  K 4.1 4.7 4.6 4.6 3.8  CL 103 104 105 105 109  CO2 29 29 28 29 28   GLUCOSE 150* 130* 239* 146* 92  BUN 21* 20 19 17 8   CREATININE 1.21 1.07 1.22 1.10 1.02  CALCIUM 8.4* 8.4* 8.4* 8.4* 8.7*   GFR: Estimated Creatinine Clearance: 87.5 mL/min (by C-G formula based on SCr of 1.02 mg/dL). Liver Function Tests:  Recent Labs Lab 01/19/16 0000  AST 22  ALT 25  ALKPHOS 45  BILITOT 0.2*  PROT 6.9  ALBUMIN 3.1*   No results for input(s): LIPASE, AMYLASE in the last 168 hours. No results for input(s): AMMONIA in the last 168 hours. Coagulation Profile:  Recent Labs Lab 01/16/16 0914  INR 1.06   Cardiac Enzymes:  Recent Labs Lab 01/19/16 0000 01/19/16 0511 01/19/16 1105  TROPONINI <0.03 <0.03 <0.03   BNP (last 3 results) No results for input(s): PROBNP in the last 8760 hours. HbA1C: No results for input(s): HGBA1C in the last 72 hours. CBG:  Recent Labs Lab 01/19/16 0727 01/19/16 1223 01/19/16 1648 01/19/16 2227 01/20/16 0816  GLUCAP 138* 188* 137* 112* 87   Lipid Profile: No results for input(s): CHOL, HDL,  LDLCALC, TRIG, CHOLHDL, LDLDIRECT in the last 72 hours. Thyroid Function Tests: No results for input(s): TSH, T4TOTAL, FREET4, T3FREE, THYROIDAB in the last 72 hours. Anemia Panel: No results for input(s): VITAMINB12, FOLATE, FERRITIN, TIBC, IRON, RETICCTPCT in the last 72 hours. Sepsis Labs: No results for input(s): PROCALCITON, LATICACIDVEN in the last 168 hours.  Recent Results (from the past 240 hour(s))  Culture, blood (Routine X 2) w Reflex to ID Panel  Status: None   Collection Time: 01/12/16  2:45 AM  Result Value Ref Range Status   Specimen Description BLOOD RIGHT ARM  Final   Special Requests BOTTLES DRAWN AEROBIC AND ANAEROBIC 10ML  Final   Culture NO GROWTH 5 DAYS  Final   Report Status 01/17/2016 FINAL  Final  Culture, blood (Routine X 2) w Reflex to ID Panel     Status: None   Collection Time: 01/12/16  2:55 AM  Result Value Ref Range Status   Specimen Description BLOOD RIGHT ARM  Final   Special Requests IN PEDIATRIC BOTTLE 4ML  Final   Culture NO GROWTH 5 DAYS  Final   Report Status 01/17/2016 FINAL  Final  Culture, Urine     Status: None   Collection Time: 01/12/16  5:56 PM  Result Value Ref Range Status   Specimen Description URINE, RANDOM  Final   Special Requests NONE  Final   Culture NO GROWTH  Final   Report Status 01/13/2016 FINAL  Final  Surgical pcr screen     Status: Abnormal   Collection Time: 01/16/16 11:43 PM  Result Value Ref Range Status   MRSA, PCR NEGATIVE NEGATIVE Final   Staphylococcus aureus POSITIVE (A) NEGATIVE Final    Comment:        The Xpert SA Assay (FDA approved for NASAL specimens in patients over 43 years of age), is one component of a comprehensive surveillance program.  Test performance has been validated by [facility] for patients greater than or equal to 44 year old. It is not intended to diagnose infection nor to guide or monitor treatment.   Aerobic/Anaerobic Culture (surgical/deep wound)     Status: None  (Preliminary result)   Collection Time: 01/17/16 11:26 AM  Result Value Ref Range Status   Specimen Description ABSCESS RIGHT FOOT  Final   Special Requests POF ZOSYN  Final   Gram Stain   Final    MODERATE WBC PRESENT,BOTH PMN AND MONONUCLEAR NO ORGANISMS SEEN    Culture NO GROWTH 2 DAYS  Final   Report Status PENDING  Incomplete         Radiology Studies: Ct Angio Chest Pe W Or Wo Contrast  Result Date: 01/19/2016 CLINICAL DATA:  Chest pain. Status post recent amputation lower extremity. History of diabetes, hypertension. EXAM: CT ANGIOGRAPHY CHEST WITH CONTRAST TECHNIQUE: Multidetector CT imaging of the chest was performed using the standard protocol during bolus administration of intravenous contrast. Multiplanar CT image reconstructions and MIPs were obtained to evaluate the vascular anatomy. CONTRAST:  100 cc Isovue 370 COMPARISON:  Chest radiograph January 18, 2016 FINDINGS: PULMONARY ARTERY: Adequate contrast opacification of the pulmonary artery's. Main pulmonary artery is not enlarged. No pulmonary arterial filling defects to the level of the subsegmental branches. MEDIASTINUM: Heart is mildly enlarged, no right heart strain. No pericardial effusion. Thoracic aorta is normal course and caliber, unremarkable. No lymphadenopathy by CT size criteria. LUNGS: Tracheobronchial tree is patent, no pneumothorax. Diffuse bronchial wall thickening. Interlobular septal thickening with patchy ground-glass opacities, scattered small centrilobular nodules. Small bilateral pleural effusions. Discoid atelectasis LEFT lower lobe. SOFT TISSUES AND OSSEOUS STRUCTURES: Included view of the abdomen is nonacute, and small hiatal hernia and gas distended esophagus associated with reflux disease. Visualized soft tissues and included osseous structures appear normal. Review of the MIP images confirms the above findings. IMPRESSION: No acute pulmonary embolism. Mild cardiomegaly and CT findings of pulmonary edema  with small pleural effusions. Electronically Signed   By: Sandie Ano  Bloomer M.D.   On: 01/19/2016 04:29   Dg Chest Port 1 View  Result Date: 01/19/2016 CLINICAL DATA:  Chest pain tonight. EXAM: PORTABLE CHEST 1 VIEW COMPARISON:  12/30/2015 FINDINGS: Lung volumes are low. Development of diffuse reticular opacities and peribronchial cuffing from prior exam. Linear atelectasis at both lung bases. The heart size and mediastinal contours are unchanged. No evidence of pleural effusion or pneumothorax. Unchanged osseous structures. IMPRESSION: Low lung volumes with development of diffuse reticular opacities and peribronchial cuffing suspicious for pulmonary edema. Atypical infection could have a similar appearance. Bibasilar atelectasis. Electronically Signed   By: Jeb Levering M.D.   On: 01/19/2016 00:01        Scheduled Meds: . ampicillin-sulbactam (UNASYN) IV  3 g Intravenous Q8H  . atorvastatin  80 mg Oral q1800  . carvedilol  12.5 mg Oral Q breakfast  . divalproex  1,000 mg Oral QHS  . enoxaparin (LOVENOX) injection  40 mg Subcutaneous Q24H  . feeding supplement (GLUCERNA SHAKE)  237 mL Oral BID BM  . gabapentin  200 mg Oral TID  . insulin aspart  0-15 Units Subcutaneous TID WC  . insulin aspart  5 Units Subcutaneous TID WC  . insulin glargine  25 Units Subcutaneous Q2200  . linezolid (ZYVOX) IV  600 mg Intravenous Q12H  . lisinopril  40 mg Oral Daily  . metoCLOPramide (REGLAN) injection  10 mg Intravenous Q6H  . multivitamin with minerals  1 tablet Oral Daily  . mupirocin ointment   Nasal BID  . pantoprazole  40 mg Oral Daily  . polyethylene glycol  17 g Oral Daily  . saccharomyces boulardii  250 mg Oral BID  . senna  2 tablet Oral QHS   Continuous Infusions: . sodium chloride    . lactated ringers 10 mL/hr at 01/17/16 0940     LOS: 8 days    Time spent: 30 minutes.     Hosie Poisson, MD [facility] Pager [phone]  If 7PM-7AM, please contact  night-coverage [email or web address] Password [email or web address] 01/20/2016, 10:13 AM

## 2016-01-21 ENCOUNTER — Encounter (HOSPITAL_COMMUNITY)
Admission: EM | Disposition: A | Discharge status: Home Health | Payer: Self-pay | Source: Home / Self Care | Attending: Internal Medicine

## 2016-01-21 ENCOUNTER — Encounter (HOSPITAL_COMMUNITY): Payer: Self-pay | Admitting: Gastroenterology

## 2016-01-21 LAB — GLUCOSE, CAPILLARY
GLUCOSE-CAPILLARY: 108 mg/dL — AB (ref 65–99)
Glucose-Capillary: 60 mg/dL — ABNORMAL LOW (ref 65–99)
Glucose-Capillary: 78 mg/dL (ref 65–99)
Glucose-Capillary: 63 mg/dL — ABNORMAL LOW (ref 65–99)
Glucose-Capillary: 144 mg/dL — ABNORMAL HIGH (ref 65–99)
Glucose-Capillary: 199 mg/dL — ABNORMAL HIGH (ref 65–99)

## 2016-01-21 LAB — CBC
HEMATOCRIT: 22.6 % — AB (ref 39.0–52.0)
Hemoglobin: 7 g/dL — ABNORMAL LOW (ref 13.0–17.0)
MCH: 29.9 pg (ref 26.0–34.0)
MCHC: 31 g/dL (ref 30.0–36.0)
MCV: 96.6 fL (ref 78.0–100.0)
PLATELETS: 345 10*3/uL (ref 150–400)
RBC: 2.34 MIL/uL — ABNORMAL LOW (ref 4.22–5.81)
RDW: 14.5 % (ref 11.5–15.5)
WBC: 6.7 10*3/uL (ref 4.0–10.5)

## 2016-01-21 LAB — BASIC METABOLIC PANEL
Anion gap: 6 (ref 5–15)
BUN: 6 mg/dL (ref 6–20)
CO2: 30 mmol/L (ref 22–32)
CREATININE: 1.01 mg/dL (ref 0.61–1.24)
Calcium: 8.7 mg/dL — ABNORMAL LOW (ref 8.9–10.3)
Chloride: 106 mmol/L (ref 101–111)
GFR calc Af Amer: >60 mL/min (ref >60)
GLUCOSE: 67 mg/dL (ref 65–99)
POTASSIUM: 3.8 mmol/L (ref 3.5–5.1)
SODIUM: 142 mmol/L (ref 135–145)

## 2016-01-21 SURGERY — CANCELLED PROCEDURE

## 2016-01-21 MED ORDER — EPINEPHRINE HCL 0.1 MG/ML IJ SOSY
PREFILLED_SYRINGE | INTRAMUSCULAR | Status: AC
  Filled 2016-01-21: qty 10

## 2016-01-21 MED ORDER — LABETALOL HCL 5 MG/ML IV SOLN
INTRAVENOUS | Status: AC
  Filled 2016-01-21: qty 4

## 2016-01-21 MED ORDER — PEG-KCL-NACL-NASULF-NA ASC-C 100 G PO SOLR
0.5000 | Freq: Once | ORAL | Status: DC
  Filled 2016-01-21 (×2): qty 1

## 2016-01-21 MED ORDER — AMLODIPINE BESYLATE 5 MG PO TABS: 5.0000 mg | ORAL_TABLET | Freq: Every day | ORAL | Status: DC

## 2016-01-21 MED ORDER — MIDAZOLAM HCL 5 MG/ML IJ SOLN
INTRAMUSCULAR | Status: AC
  Filled 2016-01-21: qty 2

## 2016-01-21 MED ORDER — LABETALOL HCL 5 MG/ML IV SOLN
5.0000 mg | Freq: Once | INTRAVENOUS | Status: AC
  Administered 2016-01-21: 5 mg via INTRAVENOUS

## 2016-01-21 MED ORDER — PEG-KCL-NACL-NASULF-NA ASC-C 100 G PO SOLR: 0.5000 | Freq: Once | ORAL | Status: DC

## 2016-01-21 MED ORDER — CARVEDILOL 12.5 MG PO TABS
12.5000 mg | ORAL_TABLET | Freq: Two times a day (BID) | ORAL | Status: DC
  Administered 2016-01-21 – 2016-01-23 (×4): 12.5 mg via ORAL
  Filled 2016-01-21 (×4): qty 1

## 2016-01-21 MED ORDER — SPOT INK MARKER SYRINGE KIT
PACK | SUBMUCOSAL | Status: AC
  Filled 2016-01-21: qty 5

## 2016-01-21 MED ORDER — DIPHENHYDRAMINE HCL 50 MG/ML IJ SOLN
INTRAMUSCULAR | Status: AC
  Filled 2016-01-21: qty 1

## 2016-01-21 MED ORDER — FENTANYL CITRATE (PF) 100 MCG/2ML IJ SOLN
INTRAMUSCULAR | Status: AC
  Filled 2016-01-21: qty 4

## 2016-01-21 MED ORDER — DEXTROSE 50 % IV SOLN
INTRAVENOUS | Status: AC
  Administered 2016-01-21: 50 mL
  Filled 2016-01-21: qty 50

## 2016-01-21 MED ORDER — GLUCAGON HCL RDNA (DIAGNOSTIC) 1 MG IJ SOLR
INTRAMUSCULAR | Status: AC
  Filled 2016-01-21: qty 1

## 2016-01-21 NOTE — Progress Notes (Signed)
GI UPDATE:  Patient came to endoscopy for EGD / colonscopy. BPs elevated to 200-230s/110s. Labetalol given x 2 without any significant change. Unfortunately procedures cancelled due to hypertension, unsafe to sedate with this degree of hypertension. Will discuss options with primary team. Patient wishes to eat at this time, given fasting for the past few days, can consider allowing him to eat and prep again tonight for procedures tomorrow.   [place]town Cellar, MD [facility]

## 2016-01-21 NOTE — Progress Notes (Signed)
Hypoglycemia Patients finger stick blood sugar is 60 this morning. Patient is asymptomatic and orange juice was offered. Patient refused the orange juice because he does not want the G.I procedure he will be having this morning to be cancelled.  D50 was administered instered. Will continue to monitor blood sugar.

## 2016-01-21 NOTE — Progress Notes (Signed)
BP 202/109 after second dose of IV labetalol, Dr. Havery Moros aware, he dicussed with patient need to cancel procedure at this time due to elevated BP. Patient understands and agrees. Dr. Havery Moros will discuss plan with hospitalist and update patient at later time. Report called to primary RN, Mickel Baas, RN.

## 2016-01-21 NOTE — Progress Notes (Signed)
Patient ID: Shane [name], male   DOB: 05/09/1971, 45 y.o.   MRN: [id number]    Progress Note   Subjective   Frustrated with waiting for procedures, hungry and weak Says breathing fine and has been diuresing- off oxygen - -sat 95 % on RA earlier  Stress Myoview low risk EF was 40% on myoview but felt normal on echo HGB 7.0   Objective   Vital signs in last 24 hours: Temp:  [98.1 F (36.7 C)-98.9 F (37.2 C)] 98.1 F (36.7 C) (08/06 0642) Pulse Rate:  [96-113] 96 (08/06 0642) Resp:  [16-18] 18 (08/06 0642) BP: (133-187)/(73-101) 187/99 (08/06 0642) SpO2:  [91 %-99 %] 95 % (08/06 0642) Last BM Date: 01/20/16 General:   hispanic male in NAD Heart:  Regular rate and rhythm; no murmurs Lungs: Respirations even and unlabored -fine basilar rales bilat Abdomen:  Soft, nontender and nondistended. Normal bowel sounds. Extremities:  Without edema. Neurologic:  Alert and oriented,  grossly normal neurologically. Psych:  Cooperative. Normal mood and affect.  Intake/Output from previous day: 08/05 0701 - 08/06 0700 In: 260 [P.O.:260] Out: 360 [Urine:360] Intake/Output this shift: No intake/output data recorded.  Lab Results:  Recent Labs  01/19/16 0511 01/20/16 0504 01/21/16 0535  WBC 8.7 8.5 6.7  HGB 7.3* 7.0* 7.0*  HCT 23.4* 22.4* 22.6*  PLT 343 358 345   BMET  Recent Labs  01/19/16 0511 01/20/16 0504 01/21/16 0535  NA 140 143 142  K 4.6 3.8 3.8  CL 105 109 106  CO2 29 28 30   GLUCOSE 146* 92 67  BUN 17 8 6   CREATININE 1.10 1.02 1.01  CALCIUM 8.4* 8.7* 8.7*   LFT  Recent Labs  01/19/16 0000  PROT 6.9  ALBUMIN 3.1*  AST 22  ALT 25  ALKPHOS 45  BILITOT 0.2*   PT/INR No results for input(s): LABPROT, INR in the last 72 hours.  Studies/Results: Nm Myocar Multi W/spect W/wall Motion / Ef  Result Date: 01/20/2016 CLINICAL DATA:  For 70-year-old male with chest pain and diabetes. EXAM: MYOCARDIAL IMAGING WITH SPECT (REST AND PHARMACOLOGIC-STRESS) GATED  LEFT VENTRICULAR WALL MOTION STUDY LEFT VENTRICULAR EJECTION FRACTION TECHNIQUE: Standard myocardial SPECT imaging was performed after resting intravenous injection of 10 mCi Tc-38m tetrofosmin. Subsequently, intravenous infusion of Lexiscan was performed under the supervision of the Cardiology staff. At peak effect of the drug, 30 mCi Tc-71m tetrofosmin was injected intravenously and standard myocardial SPECT imaging was performed. Quantitative gated imaging was also performed to evaluate left ventricular wall motion, and estimate left ventricular ejection fraction. COMPARISON:  Nuclear myocardial stress exam 04/25/2015 FINDINGS: Perfusion: No decreased activity in the left ventricle on stress imaging to suggest reversible ischemia or infarction. Decrease counts in the inferior wall on rest and stress are favored diaphragmatic attenuation. Wall Motion: Normal left ventricular wall motion. Mild LEFT ventricular dilatation. Left Ventricular Ejection Fraction: 44 % End diastolic volume 0000000 ml End systolic volume 76 ml IMPRESSION: 1. No reversible ischemia or infarction. 2. Normal left ventricular wall motion. Mild LEFT ventricular dilatation 3. Left ventricular ejection fraction 44% 4. Non invasive risk stratification*: Low *2012 Appropriate Use Criteria for Coronary Revascularization Focused Update: J Am Coll Cardiol. [id number]. http://content.airportbarriers.com.aspx?articleid=[email or web address] Electronically Signed   By: Suzy Bouchard M.D.   On: 01/20/2016 14:00   Dg Chest Port 1 View  Result Date: 01/20/2016 CLINICAL DATA:  45 year old male with a history of pulmonary edema and shortness of breath EXAM: PORTABLE CHEST 1 VIEW COMPARISON:  CT chest 01/19/2016, chest x-ray  01/18/2016. FINDINGS: Cardiomediastinal silhouette unchanged. Lung volumes remain low. Diffuse interlobular septal thickening with mixed interstitial airspace opacities at the lung bases. No large pleural effusion. No pneumothorax.  IMPRESSION: Evidence of persisting edema.  No large pleural effusion. Signed, Dulcy Fanny. Earleen Newport, DO [facility] Electronically Signed   By: Corrie Mckusick D.O.   On: 01/20/2016 11:06       Assessment / Plan:    #1 45 yo male with iron deficiency anemia- hgb 7.0 #2 Pulm edema- resolving - sats fine on room air, exam still with fine rales #3 diabetic foot ulcer  #4 aytpical CP- cardiac workup negative with echo, myoview  Plan; will proceed with colon /EGD today     Principal Problem:   Cellulitis of right ankle Active Problems:   Essential hypertension   Chronic anemia   Diabetic foot infection (HCC)   Cellulitis and abscess, left foot   Type 2 diabetes mellitus with vascular disease (HCC)   Cellulitis of left ankle   Diabetic ulcer of left foot associated with type 2 diabetes mellitus (HCC)   Swelling of foot joint   Anemia, iron deficiency   Hematochezia     LOS: 9 days   Abby Tucholski  01/21/2016, 9:30 AM

## 2016-01-21 NOTE — Progress Notes (Signed)
PT Cancellation Note  Patient Details Name: Shane [name] MRN: [id number] DOB: 08-28-1970   Cancelled Treatment:    Reason Eval/Treat Not Completed: Patient at procedure or test/unavailable, will follow as able .    Cassell Clement, PT, CSCS Pager [phone] Office [phone]  01/21/2016, 12:56 PM

## 2016-01-21 NOTE — Progress Notes (Signed)
PT Cancellation Note  Patient Details Name: Shane [name] MRN: [id number] DOB: 09-Sep-1970   Cancelled Treatment:    Reason Eval/Treat Not Completed: Patient declined, states that he does not want to get up right now. PT to continue to follow as able.    Cassell Clement, PT, CSCS Pager [phone] Office [phone]  01/21/2016, 3:59 PM

## 2016-01-21 NOTE — Progress Notes (Signed)
BP 238/122 HR 96 in sinus tach, 10 minutes after Labetalol 5 mg IV given. Dr. Havery Moros notified, verbal order to give second dose of Labetalol 5 mg IV x1 now. This was given. Will await effect.

## 2016-01-21 NOTE — Progress Notes (Signed)
Patient arrived to endoscopy for EGD and colonoscopy with Dr. Havery Moros. BP 224/119 HR 108 rhythm sinus tachycardia. Patient denies any pain at this time. Dr. Havery Moros notified. Labetalol 5mg  IV given per existing PRN order. Will await effect. Dr. Havery Moros discussed with patient need for controlling BP prior to proceeding with procedure.

## 2016-01-21 NOTE — Progress Notes (Addendum)
PROGRESS NOTE    Shane [name]  [id number] DOB: 01-08-1971 DOA: 01/11/2016 PCP: Minerva Ends, MD    Brief Narrative: Shane [name] a 45 y.o.malewith medical history significant of DM2 admitted for a diabetic right foot ulcer. He underwent I&D on 8/2 and was to be discharged the next day. But his hemoglobin started dropping and GI consulted for evaluation of iron deficiency anemia. On 8/4 pt started having  Intermittent substernal chest pain resolved with GI cocktail and sometimes spontaneously. Stress test was ordered for 8/5 .    Assessment & Plan:   Principal Problem:   Cellulitis of right ankle Active Problems:   Essential hypertension   Chronic anemia   Diabetic foot infection (HCC)   Cellulitis and abscess, left foot   Type 2 diabetes mellitus with vascular disease (HCC)   Cellulitis of left ankle   Diabetic ulcer of left foot associated with type 2 diabetes mellitus (HCC)   Swelling of foot joint   Anemia, iron deficiency   Hematochezia   Cellulitis and possible abscess of the right foot: S/p I&D on 8/2, resume IV antibiotics and wound vac,  Pain control. Improving.   Left foot ulcer stable.   Hypertension: Not well controlled, increased coreg to [facility],  Resume lisinopril and added on norvasc.    Iron deficient/ normocytic anemia: Low iron levels from 7/16, he is Jehovah's witness and will not take any blood products, but will use IV iron if needed.  Pt reports blood in the stools occasionally has been going on for a year now, never had any work up done with an endoscopy or colonoscopy.  Stool for occult blood ordered. Gastroenterology consulted , to see if needs EGD or colonoscopy . EGD and colonoscopy planned for today, but couldn't be done as his BP was elevated and it wouldnot improve even after 2 doses of labetalol.  Hemoglobin this am is 7.   Intermittent chest pain; very atypical, Resolved. ? GERD vs musculoskeletal.  Serial troponins are  negative.  EKG does not show any ischemic changes.  Resolves spontaneously and sometimes with gi cocktail.  Requested cardiology for assistance.  Echocardiogram ordered, showed normal LVEF and no regional wall abn.   Stress test is negative for inducible ischemia and low yield study.     Diabetes mellitus: CBG (last 3)   Recent Labs  01/21/16 0646 01/21/16 0728 01/21/16 1133  GLUCAP 60* 108* 78    Resume SSI. Continue the lantus of 25 units and novolog TIDAC.  No new changes to meds.   Hypoxia/ acute respiratory failure with hypoxia: CT chest shows pulmonary edema, lasix 40 mg IV ordered on 8/4, repeat CXR today . D dimer was elevated and CT angio ruled out pulmonary embolism, elevated bnp 131.   Was able to wean off oxygen.  ? Chf, serial troponins negative, and echocardiogram shows normal EF,  No pedal edema.  Continue to monitor.    DVT prophylaxis: (Lovenox) Code Status: (Full/ Family Communication: family at bedside.  Disposition Plan: pending further eval.   Consultants:   Orthopedics Dr Sharol Given  ID  Gastroenterology  Dr Acie Fredrickson   Procedures: I&D on 8/2  EGD and colonoscopy scheduled on 8/7.    Antimicrobials:  Vancomycin 7/28 > 8/1  Ceftriaxone and flagyl 7/28  Ancef 7/28 > 7/31  Unasyn 7/31 > 8/1  Zosyn 8/1 >   Linezolid 8/1 >  Subjective: RESTLESS and frustrated /   Objective: Vitals:   01/21/16 1250 01/21/16 1255 01/21/16 1300 01/21/16 1425  BP: (!) 220/112 (!) 214/115 (!) 202/109 (!) 157/80  Pulse: 95 94 93 (!) 104  Resp: (!) 22 20 19 20   Temp:    98.3 F (36.8 C)  TempSrc:      SpO2: 98% 98% 100% 99%  Weight:      Height:        Intake/Output Summary (Last 24 hours) at 01/21/16 1536 Last data filed at 01/21/16 1425  Gross per 24 hour  Intake              660 ml  Output             2760 ml  Net            -2100 ml   Filed Weights   01/15/16 1800  Weight: 72.1 kg (158 lb 15.9 oz)    Examination:  General exam:  Appears calm and comfortable on RA Respiratory system: Clear to auscultation. No wheezing or rhonchi.  Cardiovascular system: S1 & S2 heard, RRR. No JVD, murmurs, rubs, gallops or clicks. No pedal edema. Gastrointestinal system: Abdomen is nondistended, soft and nontender. No organomegaly or masses felt. Normal bowel sounds heard. Central nervous system: Alert and oriented. No focal neurological deficits. Extremities: right lower extremity s/p I&D , wound vac in place, no leakage.      Data Reviewed: I have personally reviewed following labs and imaging studies  CBC:  Recent Labs Lab 01/16/16 0914  01/18/16 0435 01/19/16 0000 01/19/16 0511 01/20/16 0504 01/21/16 0535  WBC 9.7  < > 9.6 10.1 8.7 8.5 6.7  NEUTROABS 6.7  --   --   --   --   --   --   HGB 8.3*  < > 7.1* 7.6* 7.3* 7.0* 7.0*  HCT 26.1*  < > 22.8* 24.4* 23.4* 22.4* 22.6*  MCV 94.9  < > 96.2 98.0 97.1 96.6 96.6  PLT 327  < > 323 345 343 358 345  < > = values in this interval not displayed. Basic Metabolic Panel:  Recent Labs Lab 01/18/16 0435 01/19/16 0000 01/19/16 0511 01/20/16 0504 01/21/16 0535  NA 140 140 140 143 142  K 4.7 4.6 4.6 3.8 3.8  CL 104 105 105 109 106  CO2 29 28 29 28 30   GLUCOSE 130* 239* 146* 92 67  BUN 20 19 17 8 6   CREATININE 1.07 1.22 1.10 1.02 1.01  CALCIUM 8.4* 8.4* 8.4* 8.7* 8.7*   GFR: Estimated Creatinine Clearance: 88.4 mL/min (by C-G formula based on SCr of 1.01 mg/dL). Liver Function Tests:  Recent Labs Lab 01/19/16 0000  AST 22  ALT 25  ALKPHOS 45  BILITOT 0.2*  PROT 6.9  ALBUMIN 3.1*   No results for input(s): LIPASE, AMYLASE in the last 168 hours. No results for input(s): AMMONIA in the last 168 hours. Coagulation Profile:  Recent Labs Lab 01/16/16 0914  INR 1.06   Cardiac Enzymes:  Recent Labs Lab 01/19/16 0000 01/19/16 0511 01/19/16 1105  TROPONINI <0.03 <0.03 <0.03   BNP (last 3 results) No results for input(s): PROBNP in the last 8760  hours. HbA1C: No results for input(s): HGBA1C in the last 72 hours. CBG:  Recent Labs Lab 01/20/16 1606 01/20/16 2125 01/21/16 0646 01/21/16 0728 01/21/16 1133  GLUCAP 82 205* 60* 108* 78   Lipid Profile: No results for input(s): CHOL, HDL, LDLCALC, TRIG, CHOLHDL, LDLDIRECT in the last 72 hours. Thyroid Function Tests: No results for input(s): TSH, T4TOTAL, FREET4, T3FREE, THYROIDAB in the last 72 hours.  Anemia Panel: No results for input(s): VITAMINB12, FOLATE, FERRITIN, TIBC, IRON, RETICCTPCT in the last 72 hours. Sepsis Labs: No results for input(s): PROCALCITON, LATICACIDVEN in the last 168 hours.  Recent Results (from the past 240 hour(s))  Culture, blood (Routine X 2) w Reflex to ID Panel     Status: None   Collection Time: 01/12/16  2:45 AM  Result Value Ref Range Status   Specimen Description BLOOD RIGHT ARM  Final   Special Requests BOTTLES DRAWN AEROBIC AND ANAEROBIC 10ML  Final   Culture NO GROWTH 5 DAYS  Final   Report Status 01/17/2016 FINAL  Final  Culture, blood (Routine X 2) w Reflex to ID Panel     Status: None   Collection Time: 01/12/16  2:55 AM  Result Value Ref Range Status   Specimen Description BLOOD RIGHT ARM  Final   Special Requests IN PEDIATRIC BOTTLE 4ML  Final   Culture NO GROWTH 5 DAYS  Final   Report Status 01/17/2016 FINAL  Final  Culture, Urine     Status: None   Collection Time: 01/12/16  5:56 PM  Result Value Ref Range Status   Specimen Description URINE, RANDOM  Final   Special Requests NONE  Final   Culture NO GROWTH  Final   Report Status 01/13/2016 FINAL  Final  Surgical pcr screen     Status: Abnormal   Collection Time: 01/16/16 11:43 PM  Result Value Ref Range Status   MRSA, PCR NEGATIVE NEGATIVE Final   Staphylococcus aureus POSITIVE (A) NEGATIVE Final    Comment:        The Xpert SA Assay (FDA approved for NASAL specimens in patients over 28 years of age), is one component of a comprehensive surveillance program.  Test  performance has been validated by [facility] for patients greater than or equal to 17 year old. It is not intended to diagnose infection nor to guide or monitor treatment.   Aerobic/Anaerobic Culture (surgical/deep wound)     Status: None (Preliminary result)   Collection Time: 01/17/16 11:26 AM  Result Value Ref Range Status   Specimen Description ABSCESS RIGHT FOOT  Final   Special Requests POF ZOSYN  Final   Gram Stain   Final    MODERATE WBC PRESENT,BOTH PMN AND MONONUCLEAR NO ORGANISMS SEEN    Culture NO GROWTH 2 DAYS  Final   Report Status PENDING  Incomplete         Radiology Studies: Nm Myocar Multi W/spect W/wall Motion / Ef  Result Date: 01/20/2016 CLINICAL DATA:  For 3-year-old male with chest pain and diabetes. EXAM: MYOCARDIAL IMAGING WITH SPECT (REST AND PHARMACOLOGIC-STRESS) GATED LEFT VENTRICULAR WALL MOTION STUDY LEFT VENTRICULAR EJECTION FRACTION TECHNIQUE: Standard myocardial SPECT imaging was performed after resting intravenous injection of 10 mCi Tc-49m tetrofosmin. Subsequently, intravenous infusion of Lexiscan was performed under the supervision of the Cardiology staff. At peak effect of the drug, 30 mCi Tc-44m tetrofosmin was injected intravenously and standard myocardial SPECT imaging was performed. Quantitative gated imaging was also performed to evaluate left ventricular wall motion, and estimate left ventricular ejection fraction. COMPARISON:  Nuclear myocardial stress exam 04/25/2015 FINDINGS: Perfusion: No decreased activity in the left ventricle on stress imaging to suggest reversible ischemia or infarction. Decrease counts in the inferior wall on rest and stress are favored diaphragmatic attenuation. Wall Motion: Normal left ventricular wall motion. Mild LEFT ventricular dilatation. Left Ventricular Ejection Fraction: 44 % End diastolic volume 0000000 ml End systolic volume 76 ml IMPRESSION: 1.  No reversible ischemia or infarction. 2. Normal left ventricular  wall motion. Mild LEFT ventricular dilatation 3. Left ventricular ejection fraction 44% 4. Non invasive risk stratification*: Low *2012 Appropriate Use Criteria for Coronary Revascularization Focused Update: J Am Coll Cardiol. [id number]. http://content.airportbarriers.com.aspx?articleid=[email or web address] Electronically Signed   By: Suzy Bouchard M.D.   On: 01/20/2016 14:00   Dg Chest Port 1 View  Result Date: 01/20/2016 CLINICAL DATA:  45 year old male with a history of pulmonary edema and shortness of breath EXAM: PORTABLE CHEST 1 VIEW COMPARISON:  CT chest 01/19/2016, chest x-ray 01/18/2016. FINDINGS: Cardiomediastinal silhouette unchanged. Lung volumes remain low. Diffuse interlobular septal thickening with mixed interstitial airspace opacities at the lung bases. No large pleural effusion. No pneumothorax. IMPRESSION: Evidence of persisting edema.  No large pleural effusion. Signed, Dulcy Fanny. Earleen Newport, DO [facility] Radiology Electronically Signed   By: Corrie Mckusick D.O.   On: 01/20/2016 11:06        Scheduled Meds: . ampicillin-sulbactam (UNASYN) IV  3 g Intravenous Q8H  . atorvastatin  80 mg Oral q1800  . carvedilol  12.5 mg Oral Q breakfast  . divalproex  1,000 mg Oral QHS  . enoxaparin (LOVENOX) injection  40 mg Subcutaneous Q24H  . feeding supplement (GLUCERNA SHAKE)  237 mL Oral BID BM  . furosemide  40 mg Intravenous Daily  . gabapentin  200 mg Oral TID  . insulin aspart  0-15 Units Subcutaneous TID WC  . insulin aspart  5 Units Subcutaneous TID WC  . insulin glargine  25 Units Subcutaneous Q2200  . linezolid (ZYVOX) IV  600 mg Intravenous Q12H  . lisinopril  40 mg Oral Daily  . multivitamin with minerals  1 tablet Oral Daily  . mupirocin ointment   Nasal BID  . pantoprazole  40 mg Oral Daily  . polyethylene glycol  17 g Oral Daily  . saccharomyces boulardii  250 mg Oral BID  . senna  2 tablet Oral QHS   Continuous  Infusions: . sodium chloride    . lactated ringers 10 mL/hr at 01/17/16 0940     LOS: 9 days    Time spent: 30 minutes.     Hosie Poisson, MD [facility] Pager [phone]  If 7PM-7AM, please contact night-coverage [email or web address] Password [facility] 01/21/2016, 3:36 PM

## 2016-01-22 ENCOUNTER — Encounter (HOSPITAL_COMMUNITY)
Admission: EM | Disposition: A | Discharge status: Home Health | Payer: Self-pay | Source: Home / Self Care | Attending: Internal Medicine

## 2016-01-22 ENCOUNTER — Encounter (HOSPITAL_COMMUNITY): Payer: Self-pay

## 2016-01-22 ENCOUNTER — Inpatient Hospital Stay (HOSPITAL_COMMUNITY): Payer: Medicaid Other | Admitting: Critical Care Medicine

## 2016-01-22 ENCOUNTER — Telehealth: Payer: Self-pay | Admitting: Family Medicine

## 2016-01-22 DIAGNOSIS — L0291 Cutaneous abscess, unspecified: Secondary | ICD-10-CM

## 2016-01-22 DIAGNOSIS — E08621 Diabetes mellitus due to underlying condition with foot ulcer: Secondary | ICD-10-CM

## 2016-01-22 DIAGNOSIS — L97529 Non-pressure chronic ulcer of other part of left foot with unspecified severity: Secondary | ICD-10-CM

## 2016-01-22 HISTORY — PX: ESOPHAGOGASTRODUODENOSCOPY: SHX5428

## 2016-01-22 HISTORY — PX: COLONOSCOPY: SHX5424

## 2016-01-22 LAB — GLUCOSE, CAPILLARY
GLUCOSE-CAPILLARY: 76 mg/dL (ref 65–99)
GLUCOSE-CAPILLARY: 75 mg/dL (ref 65–99)
GLUCOSE-CAPILLARY: 71 mg/dL (ref 65–99)
Glucose-Capillary: 64 mg/dL — ABNORMAL LOW (ref 65–99)

## 2016-01-22 LAB — CBC
HCT: 21.7 % — ABNORMAL LOW (ref 39.0–52.0)
HEMOGLOBIN: 6.9 g/dL — AB (ref 13.0–17.0)
MCH: 30.5 pg (ref 26.0–34.0)
MCHC: 31.8 g/dL (ref 30.0–36.0)
MCV: 96 fL (ref 78.0–100.0)
PLATELETS: 362 10*3/uL (ref 150–400)
RBC: 2.26 MIL/uL — AB (ref 4.22–5.81)
RDW: 14.4 % (ref 11.5–15.5)
WBC: 6.7 10*3/uL (ref 4.0–10.5)

## 2016-01-22 LAB — BASIC METABOLIC PANEL
ANION GAP: 6 (ref 5–15)
BUN: 5 mg/dL — ABNORMAL LOW (ref 6–20)
CALCIUM: 8.6 mg/dL — AB (ref 8.9–10.3)
CO2: 29 mmol/L (ref 22–32)
CREATININE: 0.99 mg/dL (ref 0.61–1.24)
Chloride: 105 mmol/L (ref 101–111)
Glucose, Bld: 87 mg/dL (ref 65–99)
Potassium: 3.9 mmol/L (ref 3.5–5.1)
SODIUM: 140 mmol/L (ref 135–145)

## 2016-01-22 LAB — AEROBIC/ANAEROBIC CULTURE (SURGICAL/DEEP WOUND): CULTURE: NO GROWTH

## 2016-01-22 LAB — AEROBIC/ANAEROBIC CULTURE W GRAM STAIN (SURGICAL/DEEP WOUND)

## 2016-01-22 SURGERY — EGD (ESOPHAGOGASTRODUODENOSCOPY)
Anesthesia: Monitor Anesthesia Care

## 2016-01-22 MED ORDER — PROPOFOL 10 MG/ML IV BOLUS
INTRAVENOUS | Status: DC | PRN
  Administered 2016-01-22 (×2): 10 mg via INTRAVENOUS

## 2016-01-22 MED ORDER — AMLODIPINE BESYLATE 10 MG PO TABS
10.0000 mg | ORAL_TABLET | Freq: Every day | ORAL | Status: DC
Start: 2016-01-22 — End: 2016-01-23
  Administered 2016-01-23: 10 mg via ORAL
  Filled 2016-01-22: qty 1

## 2016-01-22 MED ORDER — BUTAMBEN-TETRACAINE-BENZOCAINE 2-2-14 % EX AERO
INHALATION_SPRAY | CUTANEOUS | Status: DC | PRN
  Administered 2016-01-22: 2 via TOPICAL

## 2016-01-22 MED ORDER — PROPOFOL 500 MG/50ML IV EMUL
INTRAVENOUS | Status: DC | PRN
  Administered 2016-01-22: 100 ug/kg/min via INTRAVENOUS

## 2016-01-22 MED ORDER — LINEZOLID 600 MG PO TABS
600.0000 mg | ORAL_TABLET | Freq: Two times a day (BID) | ORAL | Status: DC
  Administered 2016-01-22 – 2016-01-23 (×2): 600 mg via ORAL
  Filled 2016-01-22 (×3): qty 1

## 2016-01-22 NOTE — Progress Notes (Signed)
Inpatient Diabetes Program Recommendations  AACE/ADA: New Consensus Statement on Inpatient Glycemic Control (2015)  Target Ranges:  Prepandial:   less than 140 mg/dL      Peak postprandial:   less than 180 mg/dL (1-2 hours)      Critically ill patients:  140 - 180 mg/dL  Results for PARVIN, CRUZE (MRN [id number]) as of 01/22/2016 10:26  Ref. Range 01/21/2016 06:46 01/21/2016 07:28 01/21/2016 11:33 01/21/2016 16:39 01/21/2016 17:41 01/21/2016 21:35 01/22/2016 06:38  Glucose-Capillary Latest Ref Range: 65 - 99 mg/dL 60 (L) 108 (H) 78 63 (L) 144 (H) 199 (H) 76    Inpatient Diabetes Program Recommendations:  Reviewed CBGs. Please consider: -Decrease in Lantus insulin to 20 units daily -Decrease meal coverage to 4 units tid if eats 50% -Decrease in Novolog correction to sensitive 0-9 units tid  Thank you, Nani Gasser. Whitaker Holderman, RN, MSN, CDE Inpatient Glycemic Control Team Team Pager [phone] (8am-5pm) 01/22/2016 10:34 AM

## 2016-01-22 NOTE — Progress Notes (Signed)
Black wound sock removed from left foot, pink foam dressing applied over ulcer on anterior left foot Shane [name] A Navon Kotowski, RN

## 2016-01-22 NOTE — Anesthesia Postprocedure Evaluation (Signed)
Anesthesia Post Note  Patient: Shane [name]  Procedure(s) Performed: Procedure(s) (LRB): ESOPHAGOGASTRODUODENOSCOPY (EGD) (N/A) COLONOSCOPY (N/A)  Patient location during evaluation: PACU Anesthesia Type: MAC Level of consciousness: awake and alert Pain management: pain level controlled Vital Signs Assessment: post-procedure vital signs reviewed and stable Respiratory status: spontaneous breathing, nonlabored ventilation, respiratory function stable and patient connected to nasal cannula oxygen Cardiovascular status: stable and blood pressure returned to baseline Anesthetic complications: no    Last Vitals:  Vitals:   01/22/16 1457 01/22/16 1510  BP: (!) 188/100 (!) 198/102  Pulse: 88 93  Resp: 18 (!) 23  Temp:      Last Pain:  Vitals:   01/22/16 1448  TempSrc: Oral  PainSc:                  Milcah Dulany DAVID

## 2016-01-22 NOTE — Anesthesia Preprocedure Evaluation (Addendum)
Anesthesia Evaluation  Patient identified by MRN, date of birth, ID band Patient awake    Airway Mallampati: I  TM Distance: >3 FB Neck ROM: Full    Dental  (+) Dental Advisory Given, Teeth Intact   Pulmonary sleep apnea ,    Pulmonary exam normal        Cardiovascular hypertension, + Peripheral Vascular Disease and +CHF  Normal cardiovascular exam     Neuro/Psych  Headaches, Anxiety Bipolar Disorder TIA   GI/Hepatic GERD  ,  Endo/Other  diabetes  Renal/GU      Musculoskeletal   Abdominal   Peds  Hematology  (+) Blood dyscrasia, anemia , Hgb 6.9 - refusal of blood products   Anesthesia Other Findings   Reproductive/Obstetrics                          Anesthesia Physical Anesthesia Plan  ASA: III  Anesthesia Plan: MAC   Post-op Pain Management:    Induction: Intravenous  Airway Management Planned: Natural Airway and Nasal Cannula  Additional Equipment:   Intra-op Plan:   Post-operative Plan:   Informed Consent: I have reviewed the patients History and Physical, chart, labs and discussed the procedure including the risks, benefits and alternatives for the proposed anesthesia with the patient or authorized representative who has indicated his/her understanding and acceptance.   Dental advisory given  Plan Discussed with: Surgeon and CRNA  Anesthesia Plan Comments:        Anesthesia Quick Evaluation

## 2016-01-22 NOTE — Telephone Encounter (Signed)
Pt's wife came to the facility requesting to have forms from [employer] filled out for patient. Forms need to be filled out by 01/25/16 and faxed [phone]). Forms will be placed in mailbox. Please f/u with pt.  Thank you, Johanna B.

## 2016-01-22 NOTE — Op Note (Signed)
[facility] Patient Name: Shane [name] Procedure Date : 01/22/2016 MRN: [id number] Attending MD: Gatha Mayer , MD Date of Birth: 1970/07/31 CSN: [id number] Age: 45 Admit Type: Inpatient Procedure:                Upper GI endoscopy Indications:              Hematochezia Providers:                Gatha Mayer, MD, Kingsley Plan, RN, Corliss Parish, Technician Referring MD:              Medicines:                Monitored Anesthesia Care Complications:            No immediate complications. Estimated Blood Loss:     Estimated blood loss: none. Procedure:                Pre-Anesthesia Assessment:                           - Prior to the procedure, a History and Physical                            was performed, and patient medications and                            allergies were reviewed. The patient's tolerance of                            previous anesthesia was also reviewed. The risks                            and benefits of the procedure and the sedation                            options and risks were discussed with the patient.                            All questions were answered, and informed consent                            was obtained. Prior Anticoagulants: The patient has                            taken no previous anticoagulant or antiplatelet                            agents. ASA Grade Assessment: III - A patient with                            severe systemic disease. After reviewing the risks  and benefits, the patient was deemed in                            satisfactory condition to undergo the procedure.                           After obtaining informed consent, the endoscope was                            passed under direct vision. Throughout the                            procedure, the patient's blood pressure, pulse, and                            oxygen saturations were  monitored continuously. The                            [id number]) scope was introduced through the                            mouth, and advanced to the second part of duodenum.                            The upper GI endoscopy was accomplished without                            difficulty. The patient tolerated the procedure                            well. Scope In: Scope Out: Findings:      The esophagus was normal.      The stomach was normal.      The examined duodenum was normal. Impression:               - Normal esophagus.                           - Normal stomach.                           - Normal examined duodenum.                           - No specimens collected. Moderate Sedation:      Please see anesthesia notes, moderate sedation not given Recommendation:           - See the other procedure note for documentation of                            additional recommendations. Colonoscopy was next                           - Resume regular diet and diabetic (ADA) diet                            [  Duration].                           - Continue present medications. Procedure Code(s):        --- Professional ---                           [phone], Esophagogastroduodenoscopy, flexible,                            transoral; diagnostic, including collection of                            specimen(s) by brushing or washing, when performed                            (separate procedure) Diagnosis Code(s):        --- Professional ---                           K92.1, Melena (includes Hematochezia) CPT copyright 2016 American Medical Association. All rights reserved. The codes documented in this report are preliminary and upon coder review may  be revised to meet current compliance requirements. Gatha Mayer, MD 01/22/2016 2:47:46 PM This report has been signed electronically. Number of Addenda: 0

## 2016-01-22 NOTE — Op Note (Signed)
[facility] Patient Name: Shane [name] Procedure Date : 01/22/2016 MRN: [id number] Attending MD: Gatha Mayer , MD Date of Birth: [birth year] CSN: [id number] Age: 45 Admit Type: Inpatient Procedure:                Colonoscopy Indications:              Hematochezia Providers:                Gatha Mayer, MD, Kingsley Plan, RN, Corliss Parish, Technician Referring MD:              Medicines:                Propofol per Anesthesia, Monitored Anesthesia Care Complications:            No immediate complications. Estimated blood loss:                            None. Estimated Blood Loss:     Estimated blood loss: none. Procedure:                Pre-Anesthesia Assessment:                           - Prior to the procedure, a History and Physical                            was performed, and patient medications and                            allergies were reviewed. The patient's tolerance of                            previous anesthesia was also reviewed. The risks                            and benefits of the procedure and the sedation                            options and risks were discussed with the patient.                            All questions were answered, and informed consent                            was obtained. Prior Anticoagulants: The patient has                            taken no previous anticoagulant or antiplatelet                            agents. ASA Grade Assessment: III - A patient with                            severe  systemic disease. After reviewing the risks                            and benefits, the patient was deemed in                            satisfactory condition to undergo the procedure.                           After obtaining informed consent, the colonoscope                            was passed under direct vision. Throughout the                            procedure, the patient's blood  pressure, pulse, and                            oxygen saturations were monitored continuously. The                            [id number]) scope was introduced through                            the anus and advanced to the the cecum, identified                            by appendiceal orifice and ileocecal valve. The                            quality of the bowel preparation was adequate to                            identify polyps 6 mm and larger in size. The                            colonoscopy was performed without difficulty. The                            patient tolerated the procedure well. The bowel                            preparation used was Miralax. The ileocecal valve,                            appendiceal orifice, and rectum were photographed. Scope In: 2:22:52 PM Scope Out: 2:30:29 PM Scope Withdrawal Time: 0 hours 0 minutes 13 seconds  Total Procedure Duration: 0 hours 7 minutes 37 seconds  Findings:      The perianal and digital rectal examinations were normal.      The colon (entire examined portion) appeared normal.      Internal hemorrhoids were found during retroflexion. Impression:               - The entire examined colon  is normal.                           - Internal hemorrhoids.                           - No specimens collected. Moderate Sedation:      Please see anesthesia notes, moderate sedation not given Recommendation:           - Repeat colonoscopy in 5 years for screening                            purposes. Dr. Havery Moros recall                           - Patient has a contact number available for                            emergencies. The signs and symptoms of potential                            delayed complications were discussed with the                            patient. Return to normal activities tomorrow.                            Written discharge instructions were provided to the                            patient.                            - Resume previous diet.                           - Continue present medications.                           GI signing off Procedure Code(s):        --- Professional ---                           [phone], Colonoscopy, flexible; diagnostic, including                            collection of specimen(s) by brushing or washing,                            when performed (separate procedure) Diagnosis Code(s):        --- Professional ---                           K92.1, Melena (includes Hematochezia) CPT copyright 2016 American Medical Association. All rights reserved. The codes documented in this report are preliminary and upon coder review may  be revised to meet current compliance requirements. Gatha Mayer, MD 01/22/2016 2:50:41 PM This  report has been signed electronically. Number of Addenda: 0

## 2016-01-22 NOTE — Progress Notes (Signed)
Crackers and soda given for low blood sugar, dinner ordered

## 2016-01-22 NOTE — Progress Notes (Signed)
Physical Therapy Treatment Patient Details Name: Shane [name] MRN: [id number] DOB: 1970-11-12 Today's Date: 01/22/2016    History of Present Illness 45 y.o. male now s/p status post I&D abscess dorsum of the right foot on 01/18/16. PMH: DM, TIA, hypertension, CHF,bipolar disorder.     PT Comments    Pt performed increased mobility and encouragement to perform gait training.  Pt required repeated education on weight bearing and safety with mobility.  Follow Up Recommendations  Home health PT;Supervision for mobility/OOB     Equipment Recommendations  Rolling walker with 5" wheels    Recommendations for Other Services       Precautions / Restrictions Precautions Precautions: Fall Required Braces or Orthoses: Other Brace/Splint Other Brace/Splint: post op shoe Restrictions Weight Bearing Restrictions: Yes RLE Weight Bearing: Touchdown weight bearing    Mobility  Bed Mobility Overal bed mobility: Needs Assistance Bed Mobility: Supine to Sit     Supine to sit: Supervision Sit to supine: Supervision   General bed mobility comments: Supervision for safety and cues for sequencing to maintain R TDWB.    Transfers Overall transfer level: Needs assistance Equipment used: Rolling walker (2 wheeled) Transfers: Sit to/from Stand Sit to Stand: Min guard         General transfer comment: Mildly unsteady catching balance on back of bed to prevent posterior LOB.  Pt educated to stand with device tomaintain balance/safety  Ambulation/Gait Ambulation/Gait assistance: Min guard Ambulation Distance (Feet): 30 Feet Assistive device: Rolling walker (2 wheeled) Gait Pattern/deviations: Step-to pattern;Trunk flexed Gait velocity: decreased Gait velocity interpretation: Below normal speed for age/gender General Gait Details: Unsteady with gait but no true LOB during gait.  Pt remains to present with R NWB with RW.  Pt performs hop to gait pattern.     Stairs Stairs: Yes Stairs  assistance: Min assist Stair Management: One rail Right;No rails;With walker (Performed first time with R rail and unable to manage weight bearing status.  Next performed with RW and improved safety noted with improved ability to maintain NWB.  ) Number of Stairs: 6 (x3 with R rail and x3 with RW.  ) General stair comments: Cues for sequencing, RW placement, hand placement, maintaining NWB and maintaining safety.  Pt educated and issued hand out on stair negotiation.  PTA re-emphasized need for 2nd person to stabilize RW.    Wheelchair Mobility    Modified Rankin (Stroke Patients Only)       Balance     Sitting balance-Leahy Scale: Good       Standing balance-Leahy Scale: Poor                      Cognition Arousal/Alertness: Awake/alert Behavior During Therapy: WFL for tasks assessed/performed Overall Cognitive Status: Within Functional Limits for tasks assessed                      Exercises      General Comments        Pertinent Vitals/Pain Pain Assessment: 0-10 Pain Score: 6  Pain Location: R foot and HA.   Pain Descriptors / Indicators: Sore Pain Intervention(s): Monitored during session;Repositioned    Home Living                      Prior Function            PT Goals (current goals can now be found in the care plan section) Acute Rehab PT Goals Patient Stated Goal:  go home Potential to Achieve Goals: Good Progress towards PT goals: Progressing toward goals    Frequency  Min 3X/week    PT Plan Current plan remains appropriate    Co-evaluation             End of Session Equipment Utilized During Treatment: Gait belt Activity Tolerance: Patient tolerated treatment well Patient left: in chair;with call bell/phone within reach     Time: 1007-1027 PT Time Calculation (min) (ACUTE ONLY): 20 min  Charges:  $Gait Training: 8-22 mins                    G Codes:      Cristela Blue Feb 09, 2016, 11:42 AM Governor Rooks, PTA pager [phone]

## 2016-01-22 NOTE — Progress Notes (Signed)
Per Dr Sharol Given remove wound vac put 4 x 4 on and wrap in a ace wrap, pt foot had been in a wound vac with no wound vac attachment since this am, pt states infectious disease dr disconnected the tube from the machine, wound vac and sponge remove with necrosis odor and color.

## 2016-01-22 NOTE — Progress Notes (Signed)
Subjective:   C/o bleeding from operative site   Antibiotics:  Anti-infectives    Start     Dose/Rate Route Frequency Ordered Stop   01/19/16 2200  Ampicillin-Sulbactam (UNASYN) 3 g in sodium chloride 0.9 % 100 mL IVPB  Status:  Discontinued     3 g 100 mL/hr over 60 Minutes Intravenous Every 8 hours 01/19/16 1640 01/21/16 1624   01/16/16 1800  linezolid (ZYVOX) IVPB 600 mg     600 mg 300 mL/hr over 60 Minutes Intravenous Every 12 hours 01/16/16 1608     01/16/16 1000  piperacillin-tazobactam (ZOSYN) IVPB 3.375 g  Status:  Discontinued     3.375 g 12.5 mL/hr over 240 Minutes Intravenous Every 8 hours 01/16/16 0850 01/19/16 1628   01/15/16 1930  ampicillin-sulbactam (UNASYN) 1.5 g in sodium chloride 0.9 % 50 mL IVPB  Status:  Discontinued     1.5 g 100 mL/hr over 30 Minutes Intravenous Every 6 hours 01/15/16 1841 01/16/16 0849   01/15/16 0000  amoxicillin-clavulanate (AUGMENTIN) 875-125 MG tablet     1 tablet Oral 2 times daily 01/15/16 1629     01/12/16 1700  ceFAZolin (ANCEF) IVPB 1 g/50 mL premix  Status:  Discontinued     1 g 100 mL/hr over 30 Minutes Intravenous Every 8 hours 01/12/16 1624 01/15/16 1808   01/12/16 1200  vancomycin (VANCOCIN) IVPB 750 mg/150 ml premix  Status:  Discontinued     750 mg 150 mL/hr over 60 Minutes Intravenous Every 8 hours 01/12/16 0422 01/16/16 1608   01/12/16 0600  metroNIDAZOLE (FLAGYL) tablet 500 mg  Status:  Discontinued     500 mg Oral Every 8 hours 01/12/16 0413 01/12/16 1613   01/12/16 0500  cefTRIAXone (ROCEPHIN) 2 g in dextrose 5 % 50 mL IVPB  Status:  Discontinued     2 g 100 mL/hr over 30 Minutes Intravenous Every 24 hours 01/12/16 0413 01/12/16 1613   01/12/16 0245  vancomycin (VANCOCIN) IVPB 1000 mg/200 mL premix     1,000 mg 200 mL/hr over 60 Minutes Intravenous  Once 01/12/16 0238 01/12/16 0426   01/12/16 0245  piperacillin-tazobactam (ZOSYN) IVPB 3.375 g     3.375 g 100 mL/hr over 30 Minutes Intravenous  Once  01/12/16 0238 01/12/16 0343      Medications: Scheduled Meds: . amLODipine  10 mg Oral Daily  . atorvastatin  80 mg Oral q1800  . carvedilol  12.5 mg Oral BID WC  . divalproex  1,000 mg Oral QHS  . enoxaparin (LOVENOX) injection  40 mg Subcutaneous Q24H  . feeding supplement (GLUCERNA SHAKE)  237 mL Oral BID BM  . gabapentin  200 mg Oral TID  . insulin aspart  0-15 Units Subcutaneous TID WC  . insulin aspart  5 Units Subcutaneous TID WC  . insulin glargine  25 Units Subcutaneous Q2200  . linezolid (ZYVOX) IV  600 mg Intravenous Q12H  . lisinopril  40 mg Oral Daily  . multivitamin with minerals  1 tablet Oral Daily  . mupirocin ointment   Nasal BID  . pantoprazole  40 mg Oral Daily  . peg 3350 powder  0.5 kit Oral Once  . polyethylene glycol  17 g Oral Daily  . saccharomyces boulardii  250 mg Oral BID  . senna  2 tablet Oral QHS   Continuous Infusions: . sodium chloride    . lactated ringers 10 mL/hr at 01/17/16 0940   PRN Meds:.acetaminophen **OR** acetaminophen, gi cocktail, HYDROcodone-acetaminophen,  labetalol, methocarbamol **OR** methocarbamol (ROBAXIN)  IV, metoCLOPramide **OR** metoCLOPramide (REGLAN) injection, nitroGLYCERIN, ondansetron **OR** ondansetron (ZOFRAN) IV, traZODone    Objective: Weight change:   Intake/Output Summary (Last 24 hours) at 01/22/16 2004 Last data filed at 01/22/16 1935  Gross per 24 hour  Intake             1520 ml  Output              880 ml  Net              640 ml   Blood pressure (!) 189/97, pulse 93, temperature 97.9 F (36.6 C), temperature source Oral, resp. rate 20, height 5' 8"  (1.727 m), weight 158 lb 15.9 oz (72.1 kg), SpO2 95 %. Temp:  [97.9 F (36.6 C)-98.6 F (37 C)] 97.9 F (36.6 C) (08/07 1800) Pulse Rate:  [85-93] 93 (08/07 1800) Resp:  [16-23] 20 (08/07 1800) BP: (153-198)/(89-102) 189/97 (08/07 1800) SpO2:  [92 %-98 %] 95 % (08/07 1800)  Physical Exam: General: Alert and awake, oriented x3, not in any acute  distress. HEENT: anicteric sclera, , EOMI CVS regular rate, normal r,  no murmur rubs or gallops Chest: , no wheezingAbdomen: soft nontender Skin: vacuum dressing on the right  Neuro: nonfocal  CBC:  CBC Latest Ref Rng & Units 01/22/2016 01/21/2016 01/20/2016  WBC 4.0 - 10.5 K/uL 6.7 6.7 8.5  Hemoglobin 13.0 - 17.0 g/dL 6.9(LL) 7.0(L) 7.0(L)  Hematocrit 39.0 - 52.0 % 21.7(L) 22.6(L) 22.4(L)  Platelets 150 - 400 K/uL 362 345 358     BMET  Recent Labs  01/21/16 0535 01/22/16 0432  NA 142 140  K 3.8 3.9  CL 106 105  CO2 30 29  GLUCOSE 67 87  BUN 6 5*  CREATININE 1.01 0.99  CALCIUM 8.7* 8.6*     Liver Panel  No results for input(s): PROT, ALBUMIN, AST, ALT, ALKPHOS, BILITOT, BILIDIR, IBILI in the last 72 hours.     Sedimentation Rate No results for input(s): ESRSEDRATE in the last 72 hours. C-Reactive Protein No results for input(s): CRP in the last 72 hours.  Micro Results: Recent Results (from the past 720 hour(s))  Culture, blood (Routine X 2) w Reflex to ID Panel     Status: None   Collection Time: 01/12/16  2:45 AM  Result Value Ref Range Status   Specimen Description BLOOD RIGHT ARM  Final   Special Requests BOTTLES DRAWN AEROBIC AND ANAEROBIC 10ML  Final   Culture NO GROWTH 5 DAYS  Final   Report Status 01/17/2016 FINAL  Final  Culture, blood (Routine X 2) w Reflex to ID Panel     Status: None   Collection Time: 01/12/16  2:55 AM  Result Value Ref Range Status   Specimen Description BLOOD RIGHT ARM  Final   Special Requests IN PEDIATRIC BOTTLE 4ML  Final   Culture NO GROWTH 5 DAYS  Final   Report Status 01/17/2016 FINAL  Final  Culture, Urine     Status: None   Collection Time: 01/12/16  5:56 PM  Result Value Ref Range Status   Specimen Description URINE, RANDOM  Final   Special Requests NONE  Final   Culture NO GROWTH  Final   Report Status 01/13/2016 FINAL  Final  Surgical pcr screen     Status: Abnormal   Collection Time: 01/16/16 11:43 PM    Result Value Ref Range Status   MRSA, PCR NEGATIVE NEGATIVE Final   Staphylococcus aureus POSITIVE (A)  NEGATIVE Final    Comment:        The Xpert SA Assay (FDA approved for NASAL specimens in patients over 71 years of age), is one component of a comprehensive surveillance program.  Test performance has been validated by [facility] for patients greater than or equal to 52 year old. It is not intended to diagnose infection nor to guide or monitor treatment.   Aerobic/Anaerobic Culture (surgical/deep wound)     Status: None   Collection Time: 01/17/16 11:26 AM  Result Value Ref Range Status   Specimen Description ABSCESS RIGHT FOOT  Final   Special Requests POF ZOSYN  Final   Gram Stain   Final    MODERATE WBC PRESENT,BOTH PMN AND MONONUCLEAR NO ORGANISMS SEEN    Culture No growth aerobically or anaerobically.  Final   Report Status 01/22/2016 FINAL  Final    Studies/Results: No results found.    Assessment/Plan:  INTERVAL HISTORY:   Sp surgery by Dr. Sharol Given  Sp EGD today   Principal Problem:   Cellulitis of right ankle Active Problems:   Essential hypertension   Chronic anemia   Diabetic foot infection ([place])   Cellulitis and abscess, left foot   Type 2 diabetes mellitus with vascular disease ([place])   Cellulitis of left ankle   Diabetic ulcer of left foot associated with type 2 diabetes mellitus (HCC)   Swelling of foot joint   Anemia, iron deficiency   Hematochezia    Shane [name] is a 45 y.o. male with  Diabetic foot infection and progression of his soft tissue infection despite appropriate broad spectrum antimicrobials now improved after I and D of foot abscess  #1 DFU with abscess sp I and D: cultures unrevealing  ---narrowed to zyvox and will convert po and would give 10-14 days of post op zyvox  I will sign off for now  Please call with further questions.        LOS: 10 days   Alcide Evener 01/22/2016, 8:04 PM

## 2016-01-22 NOTE — Care Management Note (Signed)
Case Management Note  Patient Details  Name: Shaunte Erdman MRN: [id number] Date of Birth: 21-Aug-1970  Subjective/Objective:  45 yr old gentleman  Admitted with cellulitis of right foot and ankle, s/p I & D.                 Action/Plan: Patient is active with [facility], they will resume his care at discharge.  Expected Discharge Date:    01/24/16              Expected Discharge Plan:  [place]  In-House Referral:     Discharge planning Services  CM Consult  Post Acute Care Choice:  Home Health, Resumption of Svcs/PTA Provider Choice offered to:  Patient  DME Arranged:    DME Agency:     HH Arranged:  RN [place] Agency:  [facility]  Status of Service:     If discussed at [place] of Stay Meetings, dates discussed:    Additional Comments:  Ninfa Meeker, RN 01/22/2016, 11:35 AM

## 2016-01-22 NOTE — Transfer of Care (Signed)
Immediate Anesthesia Transfer of Care Note  Patient: Shane [name]  Procedure(s) Performed: Procedure(s): ESOPHAGOGASTRODUODENOSCOPY (EGD) (N/A) COLONOSCOPY (N/A)  Patient Location: Endoscopy Unit  Anesthesia Type:MAC  Level of Consciousness: awake, alert  and oriented  Airway & Oxygen Therapy: Patient Spontanous Breathing and Patient connected to nasal cannula oxygen  Post-op Assessment: Report given to RN and Post -op Vital signs reviewed and stable  Post vital signs: Reviewed and stable  Last Vitals:  Vitals:   01/22/16 0639 01/22/16 1253  BP: (!) 160/94 (!) 190/100  Pulse: 89 85  Resp: 16 16  Temp: 37 C 36.8 C    Last Pain:  Vitals:   01/22/16 1253  TempSrc: Oral  PainSc: 7       Patients Stated Pain Goal: 2 ([id number])  Complications: No apparent anesthesia complications

## 2016-01-22 NOTE — Progress Notes (Addendum)
PROGRESS NOTE    Shane [name]  [id number]:[id number] DOB: 1970-09-18 DOA: 01/11/2016 PCP: Minerva Ends, MD    Brief Narrative: Shane [name] a 45 y.o.malewith medical history significant of DM2 admitted for a diabetic right foot ulcer. He underwent I&D on 8/2 and was to be discharged the next day. But his hemoglobin started dropping and GI consulted for evaluation of iron deficiency anemia. On 8/4 pt started having  Intermittent substernal chest pain resolved with GI cocktail and sometimes spontaneously. Stress test was ordered for 8/5, was negative for inducible ischemia .    Assessment & Plan:   Principal Problem:   Cellulitis of right ankle Active Problems:   Essential hypertension   Chronic anemia   Diabetic foot infection (HCC)   Cellulitis and abscess, left foot   Type 2 diabetes mellitus with vascular disease (HCC)   Cellulitis of left ankle   Diabetic ulcer of left foot associated with type 2 diabetes mellitus (HCC)   Swelling of foot joint   Anemia, iron deficiency   Hematochezia   Cellulitis and possible abscess of the right foot: S/p I&D on 8/2, resume IV antibiotics and wound vac,  Pain control. Improving.  Further recommendations as per Dr Sharol Given.  Left foot ulcer stable.   Hypertension: Not well controlled, increased coreg to [facility],  Resume lisinopril and added on norvasc.    Iron deficient/ normocytic anemia: Low iron levels from 7/16, he is Jehovah's witness and will not take any blood products, but will use IV iron if needed.  Pt reports blood in the stools occasionally has been going on for a year now, never had any work up done with an endoscopy or colonoscopy.  Stool for occult blood ordered. Gastroenterology consulted , to see if needs EGD or colonoscopy . EGD and colonoscopy planned for today, but couldn't be done as his BP was elevated and it wouldnot improve even after 2 doses of labetalol.  He underwent EGD and colonoscopy today, which were  essentially normal except for internal hemorrhoids.  Resumed his carb modified diet.  His hemoglobin is 6.9, but he is refusing any blood transfusion.  Will continue to watch his hemoglobin.   Intermittent chest pain; very atypical, Resolved. ? GERD vs musculoskeletal.  Serial troponins are negative.  EKG does not show any ischemic changes.  Resolves spontaneously and sometimes with gi cocktail.  Requested cardiology for assistance.  Echocardiogram ordered, showed normal LVEF and no regional wall abn.   Stress test is negative for inducible ischemia and low yield study.     Diabetes mellitus: CBG (last 3)   Recent Labs  01/22/16 1147 01/22/16 1302 01/22/16 1625  GLUCAP 75 71 64*    Resume SSI. Continue the lantus of 25 units and novolog TIDAC.  No new changes to meds.   Hypoxia/ acute respiratory failure with hypoxia: CT chest shows pulmonary edema, lasix 40 mg IV ordered on 8/4, repeat CXR shows some pulm edema.  D dimer was elevated and CT angio ruled out pulmonary embolism, elevated bnp 131.   Was able to wean off oxygen.  ? Chf, serial troponins negative, and echocardiogram shows normal EF,  No pedal edema. Discontinued the IVlasix.   DVT prophylaxis: (Lovenox) Code Status: (Full/ Family Communication: family at bedside.  Disposition Plan: pending further eval.   Consultants:   Orthopedics Dr Sharol Given  ID  Gastroenterology  Dr Acie Fredrickson   Procedures: I&D on 8/2  EGD and colonoscopy scheduled on 8/7.    Antimicrobials:  Vancomycin 7/28 >  8/1  Ceftriaxone and flagyl 7/28  Ancef 7/28 > 7/31  Unasyn 7/31 > 8/1  Zosyn 8/1 >   Linezolid 8/1 >  Subjective: Reports he feels good today.  Looking forward to the procedure.   Objective: Vitals:   01/22/16 1253 01/22/16 1448 01/22/16 1457 01/22/16 1510  BP: (!) 190/100 (!) 187/99 (!) 188/100 (!) 198/102  Pulse: 85 89 88 93  Resp: 16 18 18  (!) 23  Temp: 98.2 F (36.8 C) 98.1 F (36.7 C)      TempSrc: Oral Oral    SpO2: 95% 98% 95% 93%  Weight:      Height:        Intake/Output Summary (Last 24 hours) at 01/22/16 1842 Last data filed at 01/22/16 1734  Gross per 24 hour  Intake              440 ml  Output              880 ml  Net             -440 ml   Filed Weights   01/15/16 1800  Weight: 72.1 kg (158 lb 15.9 oz)    Examination:  General exam: Appears calm and comfortable on RA Respiratory system: Clear to auscultation. No wheezing or rhonchi.  Cardiovascular system: S1 & S2 heard, RRR. No JVD, murmurs, rubs, gallops or clicks. No pedal edema. Gastrointestinal system: Abdomen is nondistended, soft and nontender. No organomegaly or masses felt. Normal bowel sounds heard. Central nervous system: Alert and oriented. No focal neurological deficits. Extremities: right lower extremity s/p I&D , wound vac in place, no leakage.      Data Reviewed: I have personally reviewed following labs and imaging studies  CBC:  Recent Labs Lab 01/16/16 0914  01/19/16 0000 01/19/16 0511 01/20/16 0504 01/21/16 0535 01/22/16 0432  WBC 9.7  < > 10.1 8.7 8.5 6.7 6.7  NEUTROABS 6.7  --   --   --   --   --   --   HGB 8.3*  < > 7.6* 7.3* 7.0* 7.0* 6.9*  HCT 26.1*  < > 24.4* 23.4* 22.4* 22.6* 21.7*  MCV 94.9  < > 98.0 97.1 96.6 96.6 96.0  PLT 327  < > 345 343 358 345 362  < > = values in this interval not displayed. Basic Metabolic Panel:  Recent Labs Lab 01/19/16 0000 01/19/16 0511 01/20/16 0504 01/21/16 0535 01/22/16 0432  NA 140 140 143 142 140  K 4.6 4.6 3.8 3.8 3.9  CL 105 105 109 106 105  CO2 28 29 28 30 29   GLUCOSE 239* 146* 92 67 87  BUN 19 17 8 6  5*  CREATININE 1.22 1.10 1.02 1.01 0.99  CALCIUM 8.4* 8.4* 8.7* 8.7* 8.6*   GFR: Estimated Creatinine Clearance: 90.2 mL/min (by C-G formula based on SCr of 0.99 mg/dL). Liver Function Tests:  Recent Labs Lab 01/19/16 0000  AST 22  ALT 25  ALKPHOS 45  BILITOT 0.2*  PROT 6.9  ALBUMIN 3.1*   No results  for input(s): LIPASE, AMYLASE in the last 168 hours. No results for input(s): AMMONIA in the last 168 hours. Coagulation Profile:  Recent Labs Lab 01/16/16 0914  INR 1.06   Cardiac Enzymes:  Recent Labs Lab 01/19/16 0000 01/19/16 0511 01/19/16 1105  TROPONINI <0.03 <0.03 <0.03   BNP (last 3 results) No results for input(s): PROBNP in the last 8760 hours. HbA1C: No results for input(s): HGBA1C in the last 72 hours.  CBG:  Recent Labs Lab 01/21/16 2135 01/22/16 0638 01/22/16 1147 01/22/16 1302 01/22/16 1625  GLUCAP 199* 76 75 71 64*   Lipid Profile: No results for input(s): CHOL, HDL, LDLCALC, TRIG, CHOLHDL, LDLDIRECT in the last 72 hours. Thyroid Function Tests: No results for input(s): TSH, T4TOTAL, FREET4, T3FREE, THYROIDAB in the last 72 hours. Anemia Panel: No results for input(s): VITAMINB12, FOLATE, FERRITIN, TIBC, IRON, RETICCTPCT in the last 72 hours. Sepsis Labs: No results for input(s): PROCALCITON, LATICACIDVEN in the last 168 hours.  Recent Results (from the past 240 hour(s))  Surgical pcr screen     Status: Abnormal   Collection Time: 01/16/16 11:43 PM  Result Value Ref Range Status   MRSA, PCR NEGATIVE NEGATIVE Final   Staphylococcus aureus POSITIVE (A) NEGATIVE Final    Comment:        The Xpert SA Assay (FDA approved for NASAL specimens in patients over 15 years of age), is one component of a comprehensive surveillance program.  Test performance has been validated by [facility] for patients greater than or equal to 89 year old. It is not intended to diagnose infection nor to guide or monitor treatment.   Aerobic/Anaerobic Culture (surgical/deep wound)     Status: None   Collection Time: 01/17/16 11:26 AM  Result Value Ref Range Status   Specimen Description ABSCESS RIGHT FOOT  Final   Special Requests POF ZOSYN  Final   Gram Stain   Final    MODERATE WBC PRESENT,BOTH PMN AND MONONUCLEAR NO ORGANISMS SEEN    Culture No growth  aerobically or anaerobically.  Final   Report Status 01/22/2016 FINAL  Final         Radiology Studies: No results found.      Scheduled Meds: . amLODipine  10 mg Oral Daily  . atorvastatin  80 mg Oral q1800  . carvedilol  12.5 mg Oral BID WC  . divalproex  1,000 mg Oral QHS  . enoxaparin (LOVENOX) injection  40 mg Subcutaneous Q24H  . feeding supplement (GLUCERNA SHAKE)  237 mL Oral BID BM  . furosemide  40 mg Intravenous Daily  . gabapentin  200 mg Oral TID  . insulin aspart  0-15 Units Subcutaneous TID WC  . insulin aspart  5 Units Subcutaneous TID WC  . insulin glargine  25 Units Subcutaneous Q2200  . linezolid (ZYVOX) IV  600 mg Intravenous Q12H  . lisinopril  40 mg Oral Daily  . multivitamin with minerals  1 tablet Oral Daily  . mupirocin ointment   Nasal BID  . pantoprazole  40 mg Oral Daily  . peg 3350 powder  0.5 kit Oral Once  . polyethylene glycol  17 g Oral Daily  . saccharomyces boulardii  250 mg Oral BID  . senna  2 tablet Oral QHS   Continuous Infusions: . sodium chloride    . lactated ringers 10 mL/hr at 01/17/16 0940     LOS: 10 days    Time spent: 30 minutes.     Hosie Poisson, MD [facility] Pager [phone]  If 7PM-7AM, please contact night-coverage [email or web address] Password [email or web address] 01/22/2016, 6:42 PM

## 2016-01-22 NOTE — Progress Notes (Signed)
Called Dr Sharol Given regarding Mr Kuder wound Vac and his ulcer on his right foot was stuck to his wound sock according to the pt.is suppose to wear daily.  Left message with Dr Sharol Given regarding these situations.

## 2016-01-22 NOTE — Anesthesia Procedure Notes (Signed)
Procedure Name: MAC Date/Time: 01/22/2016 2:07 PM Performed by: Merrilyn Puma B Pre-anesthesia Checklist: Patient identified, Emergency Drugs available, Suction available, Patient being monitored and Timeout performed Patient Re-evaluated:Patient Re-evaluated prior to inductionOxygen Delivery Method: Nasal cannula Intubation Type: IV induction Placement Confirmation: CO2 detector,  positive ETCO2 and breath sounds checked- equal and bilateral Dental Injury: Teeth and Oropharynx as per pre-operative assessment

## 2016-01-23 ENCOUNTER — Encounter (HOSPITAL_COMMUNITY): Payer: Self-pay | Admitting: Internal Medicine

## 2016-01-23 DIAGNOSIS — L0390 Cellulitis, unspecified: Secondary | ICD-10-CM

## 2016-01-23 LAB — CBC
HEMATOCRIT: 24.9 % — AB (ref 39.0–52.0)
HEMOGLOBIN: 7.7 g/dL — AB (ref 13.0–17.0)
MCH: 30 pg (ref 26.0–34.0)
MCHC: 30.9 g/dL (ref 30.0–36.0)
MCV: 96.9 fL (ref 78.0–100.0)
Platelets: 400 10*3/uL (ref 150–400)
RBC: 2.57 MIL/uL — AB (ref 4.22–5.81)
RDW: 14.5 % (ref 11.5–15.5)
WBC: 8.3 10*3/uL (ref 4.0–10.5)

## 2016-01-23 LAB — BASIC METABOLIC PANEL
ANION GAP: 8 (ref 5–15)
BUN: 8 mg/dL (ref 6–20)
CALCIUM: 8.7 mg/dL — AB (ref 8.9–10.3)
CO2: 27 mmol/L (ref 22–32)
Chloride: 105 mmol/L (ref 101–111)
Creatinine, Ser: 1.16 mg/dL (ref 0.61–1.24)
GLUCOSE: 213 mg/dL — AB (ref 65–99)
Potassium: 4.3 mmol/L (ref 3.5–5.1)
Sodium: 140 mmol/L (ref 135–145)

## 2016-01-23 LAB — GLUCOSE, CAPILLARY
GLUCOSE-CAPILLARY: 108 mg/dL — AB (ref 65–99)
GLUCOSE-CAPILLARY: 177 mg/dL — AB (ref 65–99)
GLUCOSE-CAPILLARY: 222 mg/dL — AB (ref 65–99)
GLUCOSE-CAPILLARY: 287 mg/dL — AB (ref 65–99)

## 2016-01-23 MED ORDER — GABAPENTIN 100 MG PO CAPS: 200.0000 mg | ORAL_CAPSULE | Freq: Three times a day (TID) | ORAL | 0 refills | Status: DC

## 2016-01-23 MED ORDER — AMLODIPINE BESYLATE 10 MG PO TABS: 10.0000 mg | ORAL_TABLET | Freq: Every day | ORAL | 0 refills | Status: DC

## 2016-01-23 MED ORDER — HYDROCODONE-ACETAMINOPHEN 5-325 MG PO TABS: 1.0000 | ORAL_TABLET | Freq: Four times a day (QID) | ORAL | 0 refills | Status: DC | PRN

## 2016-01-23 MED ORDER — GLUCERNA SHAKE PO LIQD: 237.0000 mL | Freq: Two times a day (BID) | ORAL | 0 refills | Status: DC

## 2016-01-23 MED ORDER — LINEZOLID 600 MG PO TABS: 600.0000 mg | ORAL_TABLET | Freq: Two times a day (BID) | ORAL | 0 refills | Status: DC

## 2016-01-23 MED FILL — GABAPENTIN 100 MG CAPSULE: 100 | 15 days supply | Qty: 90 | Fill #0

## 2016-01-23 MED FILL — ?AMLODIPINE BESYLATE 10 MG: 10 | 30 days supply | Qty: 30 | Fill #0

## 2016-01-23 NOTE — Care Management (Signed)
Case manager notified Pamala Hurry, [place] Liaison of patient's discharge scheduled for today. The will resume Home Health services.

## 2016-01-23 NOTE — Progress Notes (Signed)
Discharge instruction RX's and follow up appts explained and provided to patient, verbalized understanding. Patient left floor via wheelchair accompanied by staff no c/o pain or shortness of breath at discharge. Advanced home care set up for patient contact information provided to patient.  Hannah Strader, Tivis Ringer, RN

## 2016-01-23 NOTE — Progress Notes (Signed)
Patient ID: Shane [name], male   DOB: Mar 19, 1971, 45 y.o.   MRN: [id number] Right foot dressing was changed this morning. Patient's incision is intact still has a small amount of serosanguineous drainage. Dry dressing with 4 x 4 and Ace wrap applied. This is to be changed daily. Patient's final cultures were negative for the deep wound hematoma. Patient's left foot is healing well with wearing the medical compression sock. Patient should change the sock where it becomes soiled. Patient is pleased with the progress of the wound healing with the left foot.

## 2016-01-23 NOTE — Discharge Summary (Signed)
Physician Discharge Summary  Shane [name] GUR:[id number] DOB: 1970/12/15 DOA: 01/11/2016  PCP: Minerva Ends, MD  Admit date: 01/11/2016 Discharge date: 01/23/2016  Admitted From: home.  Disposition: home.   Recommendations for Outpatient Follow-up:  1. Follow up with PCP in 1-2 weeks 2. Please obtain BMP/CBC in one week 3. Please follow up with hematology for iron deficiency anemia 4. Please follow upw ith orthopedics as recommended.   Home Health:yes   Discharge Condition:stable.  CODE STATUS:full code.  Diet recommendation: Heart Healthy / Carb Modified /  Brief/Interim Summary: Shane Romerois a 45 y.o.malewith medical history significant of DM2 admitted for a diabetic right foot ulcer. He underwent I&D on 8/2 and was to be discharged the next day. But his hemoglobin started dropping and GI consulted for evaluation of iron deficiency anemia. On 8/4 pt started having  Intermittent substernal chest pain resolved with GI cocktail and sometimes spontaneously. Stress test was ordered for 8/5, was negative for inducible ischemia .   Discharge Diagnoses:  Principal Problem:   Cellulitis of right ankle Active Problems:   Essential hypertension   Chronic anemia   Diabetic foot infection (HCC)   Cellulitis and abscess, left foot   Type 2 diabetes mellitus with vascular disease (HCC)   Cellulitis of left ankle   Diabetic ulcer of left foot associated with type 2 diabetes mellitus (HCC)   Swelling of foot joint   Anemia, iron deficiency   Hematochezia  Cellulitis and possible abscess of the right foot: S/p I&D on 8/2, resume IV antibiotics and wound vac,  Pain control. Improving.  Further recommendations as per Dr Sharol Given.discharged on oral antibiotics to complete the course.   Left foot ulcer stable.   Hypertension: Not well controlled, increased coreg to [facility],  Resume lisinopril and added on norvasc.    Iron deficient/ normocytic anemia: Low iron levels from 7/16,  he is Jehovah's witness and will not take any blood products, but will use IV iron if needed.  Pt reports blood in the stools occasionally has been going on for a year now, never had any work up done with an endoscopy or colonoscopy.  Stool for occult blood ordered. Gastroenterology consulted , to see if needs EGD or colonoscopy . EGD and colonoscopy planned for today, but couldn't be done as his BP was elevated and it wouldnot improve even after 2 doses of labetalol.  He underwent EGD and colonoscopy today, which were essentially normal except for internal hemorrhoids.  Resumed his carb modified diet.  Repeat hemoglobin at7.7 without any transfusions.  Recommend outpatient follow up with hematology. Referral provided.   Intermittent chest pain; very atypical, Resolved. ? GERD vs musculoskeletal.  Serial troponins are negative.  EKG does not show any ischemic changes.  Resolves spontaneously and sometimes with gi cocktail.  Requested cardiology for assistance.  Echocardiogram ordered, showed normal LVEF and no regional wall abn.   Stress test is negative for inducible ischemia and low yield study.     Diabetes mellitus: CBG (last 3)   Recent Labs (last 2 labs)    Recent Labs  01/22/16 1147 01/22/16 1302 01/22/16 1625  GLUCAP 75 71 64*      Resume SSI. Continue the lantus of 25 units and novolog TIDAC.  No new changes to meds.   Hypoxia/ acute respiratory failure with hypoxia: CT chest shows pulmonary edema, lasix 40 mg IV ordered on 8/4, repeat CXR shows some pulm edema.  D dimer was elevated and CT angio ruled out pulmonary embolism,  elevated bnp 131.   Was able to wean off oxygen.  ? Chf, serial troponins negative, and echocardiogram shows normal EF,  No pedal edema. Weaned him off oxygen , and discontinued the lasix.  The pulm edema probably from the fluids.    Discharge Instructions  Discharge Instructions    Diet - low sodium heart healthy    Complete  by:  As directed   Discharge instructions    Complete by:  As directed   Follow up with hematologist in 1 to 2 weeks.  Please follow up with PCP in one week.  Please obtain cbc in one week to check hemoglobin.       Medication List    TAKE these medications   amLODipine 10 MG tablet Commonly known as:  NORVASC Take 1 tablet (10 mg total) by mouth daily.   aspirin EC 325 MG tablet Take 1 tablet (325 mg total) by mouth daily.   atorvastatin 80 MG tablet Commonly known as:  LIPITOR Take 1 tablet (80 mg total) by mouth every morning.   carvedilol 12.5 MG tablet Commonly known as:  COREG TAKE 1 TABLET BY MOUTH 2 TIMES DAILY WITH A MEAL What changed:  See the new instructions.   divalproex 500 MG DR tablet Commonly known as:  DEPAKOTE Take 2 tablets (1,000 mg total) by mouth at bedtime.   feeding supplement (GLUCERNA SHAKE) Liqd Take 237 mLs by mouth 2 (two) times daily between meals.   ferrous sulfate 325 (65 FE) MG tablet Take 1 tablet (325 mg total) by mouth 2 (two) times daily with a meal. What changed:  when to take this   FLUoxetine 10 MG tablet Commonly known as:  PROZAC Take 1 tablet (10 mg total) by mouth every morning.   gabapentin 100 MG capsule Commonly known as:  NEURONTIN Take 2 capsules (200 mg total) by mouth 3 (three) times daily. What changed:  how much to take   glipiZIDE 10 MG tablet Commonly known as:  GLUCOTROL Take 1 tablet (10 mg total) by mouth 2 (two) times daily before a meal. What changed:  when to take this   glucose blood test strip Commonly known as:  TRUE METRIX BLOOD GLUCOSE TEST Use as instructed   HYDROcodone-acetaminophen 5-325 MG tablet Commonly known as:  NORCO/VICODIN Take 1 tablet by mouth every 6 (six) hours as needed for moderate pain.   Insulin Glargine 100 UNIT/ML Solostar Pen Commonly known as:  LANTUS SOLOSTAR Inject 10 Units into the skin daily at 10 pm.   Insulin Pen Needle 31G X 8 MM Misc Commonly known as:   B-D ULTRAFINE III SHORT PEN 1 application by Does not apply route daily.   linezolid 600 MG tablet Commonly known as:  ZYVOX Take 1 tablet (600 mg total) by mouth 2 (two) times daily.   lisinopril 40 MG tablet Commonly known as:  PRINIVIL,ZESTRIL Take 1 tablet (40 mg total) by mouth daily.   metFORMIN 1000 MG tablet Commonly known as:  GLUCOPHAGE Take 1 tablet (1,000 mg total) by mouth 2 (two) times daily with a meal.   mupirocin ointment 2 % Commonly known as:  BACTROBAN Apply topically daily. Tops of feet   omeprazole 20 MG capsule Commonly known as:  PRILOSEC Take 1 capsule (20 mg total) by mouth daily.   saccharomyces boulardii 250 MG capsule Commonly known as:  FLORASTOR Take 1 capsule (250 mg total) by mouth 2 (two) times daily.   traZODone 50 MG tablet Commonly known as:  DESYREL Take 0.5-1 tablets (25-50 mg total) by mouth at bedtime as needed for sleep.   TRUE METRIX METER w/Device Kit Use as directed   TRUEPLUS LANCETS 28G Misc Use as directed      Follow-up Information    DUDA,MARCUS V, MD Follow up in 1 week(s).   Specialty:  Orthopedic Surgery Contact information: [place] [address] [phone]        [place] .   Why:  [facility] will contact you to resume your services.  Contact information: [address] [place] [address] [phone]        Minerva Ends, MD. Schedule an appointment as soon as possible for a visit in 1 week(s).   Specialty:  Family Medicine Why:  please check cbc inone week.  you will need referral for hematologist for evaluation of anemia in 1 to 2 weeks.  Contact information: [place] [address] [phone]          Allergies  Allergen Reactions  . Other Other (See Comments)    Red meat causes stomach pains, bloating and diarrhea  . Milk-Related Compounds Diarrhea and Other (See Comments)    Any dairy products  -  diarrhea and bloating    Consultations: Orthopedics.  Gastroenterology   Procedures/Studies: Dg Chest 2 View  Result Date: 12/30/2015 CLINICAL DATA:  46 year old male with dyspnea EXAM: CHEST  2 VIEW COMPARISON:  Chest radiograph dated 12/16/2015 FINDINGS: The heart size and mediastinal contours are within normal limits. Both lungs are clear. The visualized skeletal structures are unremarkable. IMPRESSION: No active cardiopulmonary disease. Electronically Signed   By: Anner Crete M.D.   On: 12/30/2015 21:56   Ct Angio Chest Pe W Or Wo Contrast  Result Date: 01/19/2016 CLINICAL DATA:  Chest pain. Status post recent amputation lower extremity. History of diabetes, hypertension. EXAM: CT ANGIOGRAPHY CHEST WITH CONTRAST TECHNIQUE: Multidetector CT imaging of the chest was performed using the standard protocol during bolus administration of intravenous contrast. Multiplanar CT image reconstructions and MIPs were obtained to evaluate the vascular anatomy. CONTRAST:  100 cc Isovue 370 COMPARISON:  Chest radiograph January 18, 2016 FINDINGS: PULMONARY ARTERY: Adequate contrast opacification of the pulmonary artery's. Main pulmonary artery is not enlarged. No pulmonary arterial filling defects to the level of the subsegmental branches. MEDIASTINUM: Heart is mildly enlarged, no right heart strain. No pericardial effusion. Thoracic aorta is normal course and caliber, unremarkable. No lymphadenopathy by CT size criteria. LUNGS: Tracheobronchial tree is patent, no pneumothorax. Diffuse bronchial wall thickening. Interlobular septal thickening with patchy ground-glass opacities, scattered small centrilobular nodules. Small bilateral pleural effusions. Discoid atelectasis LEFT lower lobe. SOFT TISSUES AND OSSEOUS STRUCTURES: Included view of the abdomen is nonacute, and small hiatal hernia and gas distended esophagus associated with reflux disease. Visualized soft tissues and included osseous structures appear  normal. Review of the MIP images confirms the above findings. IMPRESSION: No acute pulmonary embolism. Mild cardiomegaly and CT findings of pulmonary edema with small pleural effusions. Electronically Signed   By: Elon Alas M.D.   On: 01/19/2016 04:29   Mr Foot Left Wo Contrast  Result Date: 12/31/2015 CLINICAL DATA:  Bleeding at the surgical site. Fever, chills, diabetic foot ulcer EXAM: MRI OF THE LEFT FOREFOOT WITHOUT CONTRAST; MRI OF THE LEFT ANKLE WITHOUT CONTRAST TECHNIQUE: Multiplanar, multisequence MR imaging was performed. No intravenous contrast was administered. COMPARISON:  12/17/2015, 12/01/2015 FINDINGS: TENDONS Peroneal: Peroneal longus tendon intact. Peroneal brevis intact.  Posteromedial: Posterior tibial tendon intact. Flexor hallucis longus tendon intact. Flexor digitorum longus tendon intact. Anterior: Tibialis anterior tendon intact. Extensor hallucis longus tendon intact Extensor digitorum longus tendon intact. Achilles:  Intact. Plantar Fascia: Intact. LIGAMENTS Lateral: Thickening of the anterior and posterior talofibular ligaments likely secondary to prior injury without disruption. Anterior and posterior tibiofibular ligaments intact. Medial: Deltoid ligament intact. Spring ligament intact. CARTILAGE Ankle Joint: No joint effusion. Normal ankle mortise. No chondral defect. Subtalar Joints/Sinus Tarsi: Normal subtalar joints. No subtalar joint effusion. Normal sinus tarsi. Bones: Amputation of the first phalanx. Mild subcortical reactive marrow edema in the first metatarsal head without significant T1 marrow signal abnormality or cortical destruction suggesting reactive marrow edema. Amputation of the second middle and distal phalanx. Cortical irregularity of the distal aspect of the second proximal phalanx with marrow edema which may reflect osteomyelitis versus postsurgical changes. No other marrow signal abnormality. No acute fracture or dislocation. Soft Tissue: Soft tissue  wound along the dorsal aspect of the foot with underlying mild soft tissue edema involving the dorsal aspect of the foot extending into the ankle. Small amount of air is seen tracking along the dorsal soft tissues. There is severe soft tissue edema between the first and second and second and third metatarsals concerning for infectious myositis. There is diffuse T2 signal throughout the plantar musculature likely neurogenic. IMPRESSION: 1. Soft tissue wound along the dorsal aspect of the foot with underlying mild soft tissue edema involving the dorsal aspect of the foot extending into the ankle concerning for cellulitis. Small amount of air is seen tracking along the dorsal soft tissues. Severe soft tissue edema between the first and second and second and third metatarsals concerning for infectious myositis. 2. Amputation of the first phalanx. Mild subcortical reactive marrow edema in the first metatarsal head without significant T1 marrow signal abnormality or cortical destruction suggesting reactive marrow edema. 3. Amputation of the second middle and distal phalanx. Cortical irregularity of the distal aspect of the second proximal phalanx with marrow edema which may reflect osteomyelitis versus postsurgical changes. 4. There is diffuse T2 signal throughout the plantar musculature likely neurogenic. Electronically Signed   By: Kathreen Devoid   On: 12/31/2015 15:33   Mr Foot Right W Wo Contrast  Result Date: 01/14/2016 CLINICAL DATA:  Pain and swelling. Diabetic foot ulcer on the dorsum of the foot. EXAM: MRI OF THE RIGHT FOREFOOT WITHOUT AND WITH CONTRAST TECHNIQUE: Multiplanar, multisequence MR imaging was performed both before and after administration of intravenous contrast. CONTRAST:  67m MULTIHANCE GADOBENATE DIMEGLUMINE 529 MG/ML IV SOLN COMPARISON:  Radiographs 01/11/2016. FINDINGS: Diffuse subcutaneous soft tissue swelling/ edema suggesting cellulitis. Gas is noted in the dorsal soft tissues which could  be due to the open wound for gas producing infection. No discrete drainable soft tissue abscess. No definite MR findings for septic arthritis or osteomyelitis. Mild myofasciitis. No findings for pyomyositis. IMPRESSION: Diffuse and severe cellulitis along the dorsum of the foot without discrete drainable soft tissue abscess, pyomyositis, septic arthritis or osteomyelitis. As demonstrated on the plain films there is gas in the dorsal subcutaneous tissues. Electronically Signed   By: PMarijo SanesM.D.   On: 01/14/2016 13:03  Mr Ankle Left  Wo Contrast  Result Date: 12/31/2015 CLINICAL DATA:  Bleeding at the surgical site. Fever, chills, diabetic foot ulcer EXAM: MRI OF THE LEFT FOREFOOT WITHOUT CONTRAST; MRI OF THE LEFT ANKLE WITHOUT CONTRAST TECHNIQUE: Multiplanar, multisequence MR imaging was performed. No intravenous contrast was administered. COMPARISON:  12/17/2015, 12/01/2015  FINDINGS: TENDONS Peroneal: Peroneal longus tendon intact. Peroneal brevis intact. Posteromedial: Posterior tibial tendon intact. Flexor hallucis longus tendon intact. Flexor digitorum longus tendon intact. Anterior: Tibialis anterior tendon intact. Extensor hallucis longus tendon intact Extensor digitorum longus tendon intact. Achilles:  Intact. Plantar Fascia: Intact. LIGAMENTS Lateral: Thickening of the anterior and posterior talofibular ligaments likely secondary to prior injury without disruption. Anterior and posterior tibiofibular ligaments intact. Medial: Deltoid ligament intact. Spring ligament intact. CARTILAGE Ankle Joint: No joint effusion. Normal ankle mortise. No chondral defect. Subtalar Joints/Sinus Tarsi: Normal subtalar joints. No subtalar joint effusion. Normal sinus tarsi. Bones: Amputation of the first phalanx. Mild subcortical reactive marrow edema in the first metatarsal head without significant T1 marrow signal abnormality or cortical destruction suggesting reactive marrow edema. Amputation of the second middle  and distal phalanx. Cortical irregularity of the distal aspect of the second proximal phalanx with marrow edema which may reflect osteomyelitis versus postsurgical changes. No other marrow signal abnormality. No acute fracture or dislocation. Soft Tissue: Soft tissue wound along the dorsal aspect of the foot with underlying mild soft tissue edema involving the dorsal aspect of the foot extending into the ankle. Small amount of air is seen tracking along the dorsal soft tissues. There is severe soft tissue edema between the first and second and second and third metatarsals concerning for infectious myositis. There is diffuse T2 signal throughout the plantar musculature likely neurogenic. IMPRESSION: 1. Soft tissue wound along the dorsal aspect of the foot with underlying mild soft tissue edema involving the dorsal aspect of the foot extending into the ankle concerning for cellulitis. Small amount of air is seen tracking along the dorsal soft tissues. Severe soft tissue edema between the first and second and second and third metatarsals concerning for infectious myositis. 2. Amputation of the first phalanx. Mild subcortical reactive marrow edema in the first metatarsal head without significant T1 marrow signal abnormality or cortical destruction suggesting reactive marrow edema. 3. Amputation of the second middle and distal phalanx. Cortical irregularity of the distal aspect of the second proximal phalanx with marrow edema which may reflect osteomyelitis versus postsurgical changes. 4. There is diffuse T2 signal throughout the plantar musculature likely neurogenic. Electronically Signed   By: Kathreen Devoid   On: 12/31/2015 15:33   Nm Myocar Multi W/spect Tamela Oddi Motion / Ef  Result Date: 01/20/2016 CLINICAL DATA:  For 62-year-old male with chest pain and diabetes. EXAM: MYOCARDIAL IMAGING WITH SPECT (REST AND PHARMACOLOGIC-STRESS) GATED LEFT VENTRICULAR WALL MOTION STUDY LEFT VENTRICULAR EJECTION FRACTION TECHNIQUE:  Standard myocardial SPECT imaging was performed after resting intravenous injection of 10 mCi Tc-55mtetrofosmin. Subsequently, intravenous infusion of Lexiscan was performed under the supervision of the Cardiology staff. At peak effect of the drug, 30 mCi Tc-947metrofosmin was injected intravenously and standard myocardial SPECT imaging was performed. Quantitative gated imaging was also performed to evaluate left ventricular wall motion, and estimate left ventricular ejection fraction. COMPARISON:  Nuclear myocardial stress exam 04/25/2015 FINDINGS: Perfusion: No decreased activity in the left ventricle on stress imaging to suggest reversible ischemia or infarction. Decrease counts in the inferior wall on rest and stress are favored diaphragmatic attenuation. Wall Motion: Normal left ventricular wall motion. Mild LEFT ventricular dilatation. Left Ventricular Ejection Fraction: 44 % End diastolic volume 13579l End systolic volume 76 ml IMPRESSION: 1. No reversible ischemia or infarction. 2. Normal left ventricular wall motion. Mild LEFT ventricular dilatation 3. Left ventricular ejection fraction 44% 4. Non invasive risk stratification*: Low *2012 Appropriate Use Criteria for  Coronary Revascularization Focused Update: J Am Coll Cardiol. 1610;96(0):454-098. http://content.[email or web address].aspx?articleid=[email or web address] Electronically Signed   By: Suzy Bouchard M.D.   On: 01/20/2016 14:00   Dg Chest Port 1 View  Result Date: 01/20/2016 CLINICAL DATA:  45 year old male with a history of pulmonary edema and shortness of breath EXAM: PORTABLE CHEST 1 VIEW COMPARISON:  CT chest 01/19/2016, chest x-ray 01/18/2016. FINDINGS: Cardiomediastinal silhouette unchanged. Lung volumes remain low. Diffuse interlobular septal thickening with mixed interstitial airspace opacities at the lung bases. No large pleural effusion. No pneumothorax. IMPRESSION: Evidence of persisting edema.  No large pleural effusion. Signed, Dulcy Fanny.  Earleen Newport, DO [facility] Electronically Signed   By: Corrie Mckusick D.O.   On: 01/20/2016 11:06   Dg Chest Port 1 View  Result Date: 01/19/2016 CLINICAL DATA:  Chest pain tonight. EXAM: PORTABLE CHEST 1 VIEW COMPARISON:  12/30/2015 FINDINGS: Lung volumes are low. Development of diffuse reticular opacities and peribronchial cuffing from prior exam. Linear atelectasis at both lung bases. The heart size and mediastinal contours are unchanged. No evidence of pleural effusion or pneumothorax. Unchanged osseous structures. IMPRESSION: Low lung volumes with development of diffuse reticular opacities and peribronchial cuffing suspicious for pulmonary edema. Atypical infection could have a similar appearance. Bibasilar atelectasis. Electronically Signed   By: Jeb Levering M.D.   On: 01/19/2016 00:01   Dg Foot Complete Left  Result Date: 01/12/2016 CLINICAL DATA:  45 year old male with open diabetic ulcer on top of the left foot EXAM: LEFT FOOT - COMPLETE 3+ VIEW COMPARISON:  Foot MRI dated 12/31/2015 and radiograph dated 12/30/2015 FINDINGS: There is amputation of the first metatarsophalangeal joint as well as amputation of the distal aspect of the proximal phalanx of the second toe. There is soft tissue thickening over the amputation site without soft tissue gas. There is no periosteal reaction or bony erosion to suggest osteomyelitis. No acute fracture or dislocation identified. There is again diffuse soft tissue swelling of the foot primarily over the dorsum of the foot. There is a skin wound in the dorsum of the forefoot as seen on the prior study. No radiopaque foreign object identified. IMPRESSION: Diffuse soft tissue swelling of the foot with a wound in the dorsal aspect of the forefoot as seen on the prior radiograph. No interval change since the prior radiograph. Electronically Signed   By: Anner Crete M.D.   On: 01/12/2016 05:31  Dg Foot  Complete Left  Result Date: 12/30/2015 CLINICAL DATA:  45 year old male with amputation of the left foot 3 weeks ago presenting with swelling. EXAM: LEFT FOOT - COMPLETE 3+ VIEW COMPARISON:  Left foot MRI dated 12/17/2015 FINDINGS: There is amputation of the great toe at the metatarsophalangeal joint. There is also amputation of the distal aspect of the proximal phalanx of the second toe. There is no acute fracture or dislocation. There is no bony erosions or periosteal reaction. No significant arthropathy. There is diffuse soft tissue swelling of the forefoot and over the first metatarsal head. On the lateral projection there is an area of lucency in the soft tissues of the dorsum of the foot which may represent a skin ulcer or air within the soft tissue concerning for an abscess. Clinical correlation is recommended. No radiopaque foreign object identified. IMPRESSION: Amputation of the great toe and distal aspect of the second toe. Diffuse soft tissue swelling of the forefoot with an area of skin ulceration and soft tissue gas in the dorsum of the foot which may represent an  abscess. Clinical correlation is recommended. Electronically Signed   By: Anner Crete M.D.   On: 12/30/2015 18:43   Dg Foot Complete Right  Result Date: 01/11/2016 CLINICAL DATA:  45 year old male with right foot pain and swelling. EXAM: RIGHT FOOT COMPLETE - 3+ VIEW COMPARISON:  Radiograph dated 09/01/2015 the FINDINGS: There is no acute fracture or dislocation. The bones are well mineralized. No arthritic changes. There is diffuse soft tissue swelling of the foot. No radiopaque foreign object or soft tissue gas identified. IMPRESSION: No acute osseous pathology. Diffuse soft tissue swelling of the foot. Electronically Signed   By: Anner Crete M.D.   On: 01/11/2016 23:59      Subjective: No new complaints.   Discharge Exam: Vitals:   01/23/16 0016 01/23/16 0616  BP: (!) 152/85 (!) 149/90  Pulse: 91 100  Resp: 18  17  Temp:  98.1 F (36.7 C)   Vitals:   01/22/16 1800 01/22/16 2020 01/23/16 0016 01/23/16 0616  BP: (!) 189/97 (!) 165/85 (!) 152/85 (!) 149/90  Pulse: 93 99 91 100  Resp: 20 19 18 17   Temp: 97.9 F (36.6 C) 97.3 F (36.3 C)  98.1 F (36.7 C)  TempSrc: Oral Oral  Oral  SpO2: 95% 97% 95% 94%  Weight:      Height:        General: Pt is alert, awake, not in acute distress Cardiovascular: RRR, S1/S2 +, no rubs, no gallops Respiratory: CTA bilaterally, no wheezing, no rhonchi Abdominal: Soft, NT, ND, bowel sounds + Extremities: no edema, no cyanosis    The results of significant diagnostics from this hospitalization (including imaging, microbiology, ancillary and laboratory) are listed below for reference.     Microbiology: Recent Results (from the past 240 hour(s))  Surgical pcr screen     Status: Abnormal   Collection Time: 01/16/16 11:43 PM  Result Value Ref Range Status   MRSA, PCR NEGATIVE NEGATIVE Final   Staphylococcus aureus POSITIVE (A) NEGATIVE Final    Comment:        The Xpert SA Assay (FDA approved for NASAL specimens in patients over 68 years of age), is one component of a comprehensive surveillance program.  Test performance has been validated by [facility] for patients greater than or equal to 61 year old. It is not intended to diagnose infection nor to guide or monitor treatment.   Aerobic/Anaerobic Culture (surgical/deep wound)     Status: None   Collection Time: 01/17/16 11:26 AM  Result Value Ref Range Status   Specimen Description ABSCESS RIGHT FOOT  Final   Special Requests POF ZOSYN  Final   Gram Stain   Final    MODERATE WBC PRESENT,BOTH PMN AND MONONUCLEAR NO ORGANISMS SEEN    Culture No growth aerobically or anaerobically.  Final   Report Status 01/22/2016 FINAL  Final     Labs: BNP (last 3 results)  Recent Labs  08/01/15 2106 12/01/15 0801 01/19/16 0511  BNP 34.7 18.2 248.2*   Basic Metabolic Panel:  Recent Labs Lab  01/19/16 0511 01/20/16 0504 01/21/16 0535 01/22/16 0432 01/23/16 0300  NA 140 143 142 140 140  K 4.6 3.8 3.8 3.9 4.3  CL 105 109 106 105 105  CO2 29 28 30 29 27   GLUCOSE 146* 92 67 87 213*  BUN 17 8 6  5* 8  CREATININE 1.10 1.02 1.01 0.99 1.16  CALCIUM 8.4* 8.7* 8.7* 8.6* 8.7*   Liver Function Tests:  Recent Labs Lab 01/19/16 0000  AST  22  ALT 25  ALKPHOS 45  BILITOT 0.2*  PROT 6.9  ALBUMIN 3.1*   No results for input(s): LIPASE, AMYLASE in the last 168 hours. No results for input(s): AMMONIA in the last 168 hours. CBC:  Recent Labs Lab 01/19/16 0511 01/20/16 0504 01/21/16 0535 01/22/16 0432 01/23/16 0300  WBC 8.7 8.5 6.7 6.7 8.3  HGB 7.3* 7.0* 7.0* 6.9* 7.7*  HCT 23.4* 22.4* 22.6* 21.7* 24.9*  MCV 97.1 96.6 96.6 96.0 96.9  PLT 343 358 345 362 400   Cardiac Enzymes:  Recent Labs Lab 01/19/16 0000 01/19/16 0511 01/19/16 1105  TROPONINI <0.03 <0.03 <0.03   BNP: Invalid input(s): POCBNP CBG:  Recent Labs Lab 01/22/16 1625 01/22/16 2212 01/23/16 0036 01/23/16 0716 01/23/16 1134  GLUCAP 64* 222* 287* 108* 177*   D-Dimer No results for input(s): DDIMER in the last 72 hours. Hgb A1c No results for input(s): HGBA1C in the last 72 hours. Lipid Profile No results for input(s): CHOL, HDL, LDLCALC, TRIG, CHOLHDL, LDLDIRECT in the last 72 hours. Thyroid function studies No results for input(s): TSH, T4TOTAL, T3FREE, THYROIDAB in the last 72 hours.  Invalid input(s): FREET3 Anemia work up No results for input(s): VITAMINB12, FOLATE, FERRITIN, TIBC, IRON, RETICCTPCT in the last 72 hours. Urinalysis    Component Value Date/Time   COLORURINE YELLOW 01/12/2016 1756   APPEARANCEUR CLEAR 01/12/2016 1756   LABSPEC 1.022 01/12/2016 1756   PHURINE 6.0 01/12/2016 1756   GLUCOSEU >1000 (A) 01/12/2016 1756   HGBUR NEGATIVE 01/12/2016 1756   BILIRUBINUR NEGATIVE 01/12/2016 1756   KETONESUR NEGATIVE 01/12/2016 1756   PROTEINUR 30 (A) 01/12/2016 1756    UROBILINOGEN 0.2 04/24/2015 1056   NITRITE NEGATIVE 01/12/2016 1756   LEUKOCYTESUR NEGATIVE 01/12/2016 1756   Sepsis Labs Invalid input(s): PROCALCITONIN,  WBC,  LACTICIDVEN Microbiology Recent Results (from the past 240 hour(s))  Surgical pcr screen     Status: Abnormal   Collection Time: 01/16/16 11:43 PM  Result Value Ref Range Status   MRSA, PCR NEGATIVE NEGATIVE Final   Staphylococcus aureus POSITIVE (A) NEGATIVE Final    Comment:        The Xpert SA Assay (FDA approved for NASAL specimens in patients over 57 years of age), is one component of a comprehensive surveillance program.  Test performance has been validated by [facility] for patients greater than or equal to 36 year old. It is not intended to diagnose infection nor to guide or monitor treatment.   Aerobic/Anaerobic Culture (surgical/deep wound)     Status: None   Collection Time: 01/17/16 11:26 AM  Result Value Ref Range Status   Specimen Description ABSCESS RIGHT FOOT  Final   Special Requests POF ZOSYN  Final   Gram Stain   Final    MODERATE WBC PRESENT,BOTH PMN AND MONONUCLEAR NO ORGANISMS SEEN    Culture No growth aerobically or anaerobically.  Final   Report Status 01/22/2016 FINAL  Final     Time coordinating discharge: Over 30 minutes  SIGNED:   Hosie Poisson, MD  [facility] 01/23/2016, 2:07 PM Pager   If 7PM-7AM, please contact night-coverage [email or web address] Password [email or web address]

## 2016-01-24 NOTE — Telephone Encounter (Signed)
Form completed and needs to be faxed and scanned into patient's chart Note,  form is front and back.  Please also call patient to ask if he is L or R handed.

## 2016-01-25 NOTE — Telephone Encounter (Signed)
Patient was informed of faxed paperwork

## 2016-01-29 ENCOUNTER — Other Ambulatory Visit: Payer: Self-pay | Admitting: Family Medicine

## 2016-01-29 ENCOUNTER — Inpatient Hospital Stay: Payer: Self-pay

## 2016-01-29 MED FILL — ATORVASTATIN 80 MG TABLET: 80 | 30 days supply | Qty: 30 | Fill #9

## 2016-01-29 MED FILL — FERROUS SULFATE 325 MG TAB: 325 (65 FE) | 30 days supply | Qty: 60 | Fill #2

## 2016-01-29 MED FILL — ?OMEPRAZOLE DR 20 MG CAPSUL: 20 | 30 days supply | Qty: 30 | Fill #5

## 2016-01-29 MED FILL — ?LISINOPRIL 40 MG TABLET: 40 MG | 30 days supply | Qty: 30 | Fill #3

## 2016-01-29 MED FILL — glipiZIDE 10 MG TABS: 10 | 30 days supply | Qty: 60 | Fill #2

## 2016-01-30 MED FILL — FLUoxetine HCL 10 MG CAPS: 10 | 30 days supply | Qty: 30 | Fill #0

## 2016-01-30 MED FILL — ?CARVEDILOL 12.5 MG TABLET: 12.5 | 30 days supply | Qty: 60 | Fill #0

## 2016-01-30 MED FILL — traZODone HCL 50 MG TABS: 50 | 30 days supply | Qty: 30 | Fill #2

## 2016-01-31 MED FILL — TRUE METRIX TEST STRIP: 25 days supply | Qty: 100 | Fill #1

## 2016-02-06 ENCOUNTER — Telehealth: Payer: Self-pay | Admitting: Hematology and Oncology

## 2016-02-06 NOTE — Telephone Encounter (Signed)
Patient returned Sara's call about an appt. Appt has been scheduled with Gudena on 8/25 at 9am. Patient aware to arrive 30 minutes early. Directions was given.

## 2016-02-07 MED FILL — ?METFORMIN HCL 1,000 MG TAB: 1000 | 30 days supply | Qty: 60 | Fill #3

## 2016-02-09 ENCOUNTER — Telehealth: Payer: Self-pay

## 2016-02-09 ENCOUNTER — Encounter: Payer: Self-pay | Admitting: Hematology and Oncology

## 2016-02-09 ENCOUNTER — Ambulatory Visit (HOSPITAL_BASED_OUTPATIENT_CLINIC_OR_DEPARTMENT_OTHER): Payer: Medicaid Other

## 2016-02-09 ENCOUNTER — Telehealth: Payer: Self-pay | Admitting: Hematology and Oncology

## 2016-02-09 ENCOUNTER — Ambulatory Visit (HOSPITAL_BASED_OUTPATIENT_CLINIC_OR_DEPARTMENT_OTHER): Payer: Medicaid Other | Admitting: Hematology and Oncology

## 2016-02-09 DIAGNOSIS — E785 Hyperlipidemia, unspecified: Secondary | ICD-10-CM | POA: Diagnosis not present

## 2016-02-09 DIAGNOSIS — D638 Anemia in other chronic diseases classified elsewhere: Secondary | ICD-10-CM

## 2016-02-09 DIAGNOSIS — E119 Type 2 diabetes mellitus without complications: Secondary | ICD-10-CM | POA: Diagnosis not present

## 2016-02-09 DIAGNOSIS — B999 Unspecified infectious disease: Secondary | ICD-10-CM

## 2016-02-09 DIAGNOSIS — I5032 Chronic diastolic (congestive) heart failure: Secondary | ICD-10-CM | POA: Diagnosis not present

## 2016-02-09 DIAGNOSIS — I10 Essential (primary) hypertension: Secondary | ICD-10-CM | POA: Diagnosis not present

## 2016-02-09 LAB — CBC & DIFF AND RETIC
BASO%: 0.7 % (ref 0.0–2.0)
BASOS ABS: 0.1 10*3/uL (ref 0.0–0.1)
EOS%: 3.5 % (ref 0.0–7.0)
Eosinophils Absolute: 0.3 10*3/uL (ref 0.0–0.5)
HEMATOCRIT: 30.7 % — AB (ref 38.4–49.9)
HGB: 9.8 g/dL — ABNORMAL LOW (ref 13.0–17.1)
Immature Retic Fract: 4 % (ref 3.00–10.60)
LYMPH#: 1.9 10*3/uL (ref 0.9–3.3)
LYMPH%: 25.1 % (ref 14.0–49.0)
MCH: 30.2 pg (ref 27.2–33.4)
MCHC: 31.9 g/dL — ABNORMAL LOW (ref 32.0–36.0)
MCV: 94.5 fL (ref 79.3–98.0)
MONO#: 0.4 10*3/uL (ref 0.1–0.9)
MONO%: 5.3 % (ref 0.0–14.0)
NEUT#: 4.9 10*3/uL (ref 1.5–6.5)
NEUT%: 65.4 % (ref 39.0–75.0)
PLATELETS: 296 10*3/uL (ref 140–400)
RBC: 3.25 10*6/uL — ABNORMAL LOW (ref 4.20–5.82)
RDW: 14.3 % (ref 11.0–14.6)
RETIC CT ABS: 33.48 10*3/uL — AB (ref 34.80–93.90)
Retic %: 1.03 % (ref 0.80–1.80)
WBC: 7.5 10*3/uL (ref 4.0–10.3)

## 2016-02-09 LAB — IRON AND TIBC
%SAT: 28 % (ref 20–55)
IRON: 66 ug/dL (ref 42–163)
TIBC: 239 ug/dL (ref 202–409)
UIBC: 172 ug/dL (ref 117–376)

## 2016-02-09 LAB — FERRITIN: Ferritin: 560 ng/ml — ABNORMAL HIGH (ref 22–316)

## 2016-02-09 NOTE — Telephone Encounter (Signed)
Per Dr. Lindi Adie, let pt know IV iron not needed.  Pt to remain on oral iron supplement.  Let pt know we will call him when erythropoietin results avail and can make a determination on aranesp.  Let pt know Dr. Lindi Adie wants to see him in one month.  Pt voiced understanding.

## 2016-02-09 NOTE — Telephone Encounter (Signed)
appt made and avs printed °

## 2016-02-09 NOTE — Telephone Encounter (Signed)
03/07/2016 APPOINTMENT RESCHEDULED FOR 03/08/2016 PER PATIENT REQUEST. SCHEDULING CONVENIENCE.

## 2016-02-09 NOTE — Assessment & Plan Note (Signed)
Patient with diabetes mellitus, essential hypertension, CHF, recent left foot cellulitis and abscess Hospitalization 01/11/2016 to 01/23/2016 Upper endoscopy and colonoscopy were normal Cardiac stress test 01/20/2016 was negative for ischemia [employer] Iron studies reveal moderate iron deficiency.  Recommendation: 1. IV iron therapy 2. treat underlying infection from cellulitis 3. Better control of diabetes and hypertension  Return to clinic 1 month after IV iron therapy to recheck on the response to treatment.

## 2016-02-09 NOTE — Progress Notes (Signed)
[facility] NOTE  Patient Care Team: Boykin Nearing, MD as PCP - General (Family Medicine)  CHIEF COMPLAINTS/PURPOSE OF CONSULTATION:  Normocytic anemia  HISTORY OF PRESENTING ILLNESS:  Shane [name] 45 y.o. male is here because of recent diagnosis of severe normocytic anemia. 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 He had normal hemoglobin about 10 months ago and ever since his foot infection started his hemoglobin declined over time. The lowest when he was in the hospital was 6.5. He was given IV iron 2 doses in the hospital and apparently it helped it a little bit but then it went down. Patient was discharged home but he continues to feel fatigue and shortness of breath to minimal exertion. He also underwent upper endoscopy and colonoscopy both of which were normal. He is a diabetic and also has hypertension and hyperlipidemia. Chronic leg cellulitis. He also has chronic diastolic heart failure.  MEDICAL HISTORY:  Past Medical History:  Diagnosis Date  . Anemia   . Bipolar disorder ([place])   . Chest pain    a. 2015 Reportedly normal stress test in [place].  Marland Kitchen Chronic diastolic CHF (congestive heart failure) (HCC)    a.03/2015 Echo: EF 55-60%, Gr 1 DD, mild MR, triv PR.  . Depression with anxiety   . GERD (gastroesophageal reflux disease)   . Hyperlipidemia   . Hypertension    a. 08/2014 Admitted with hypertensive urgency.  . Refusal of blood transfusions as patient is Jehovah's Witness   . Sleep apnea   . TIA (transient ischemic attack) 08/2014; 03/2015   a. 08/2014 in setting of hypertensive urgency.  . Type II diabetes mellitus ([place])    started insulin spring 2016  . Vitamin D deficiency spring 2016    SURGICAL HISTORY: Past Surgical History:  Procedure Laterality Date  . AMPUTATION Left 08/03/2015   Procedure: AMPUTATION LEFT GREAT TOE;  Surgeon: Newt Minion, MD;  Location: [place];   Service: Orthopedics;  Laterality: Left;  . AMPUTATION Left 12/02/2015   Procedure: amputation of left 2nd digit  . APPLICATION OF WOUND VAC Left 12/19/2015   Procedure: APPLICATION OF WOUND VAC;  Surgeon: Meredith Pel, MD;  Location: [facility];  Service: Orthopedics;  Laterality: Left;  . CIRCUMCISION    . COLONOSCOPY N/A 01/22/2016   Procedure: COLONOSCOPY;  Surgeon: Gatha Mayer, MD;  Location: [place];  Service: Endoscopy;  Laterality: N/A;  . ESOPHAGOGASTRODUODENOSCOPY N/A 01/19/2016   Procedure: ESOPHAGOGASTRODUODENOSCOPY (EGD);  Surgeon: Manus Gunning, MD;  Location: [facility];  Service: Gastroenterology;  Laterality: N/A;  . ESOPHAGOGASTRODUODENOSCOPY N/A 01/22/2016   Procedure: ESOPHAGOGASTRODUODENOSCOPY (EGD);  Surgeon: Gatha Mayer, MD;  Location[facility] ENDOSCOPY;  Service: Endoscopy;  Laterality: N/A;  . I&D EXTREMITY Left 12/02/2015   Procedure: IRRIGATION AND DEBRIDEMENT OF FOOT; LEFT SECOND TOE AMPUTATION;  Surgeon: Meredith Pel, MD;  Location: [facility];  Service: Orthopedics;  Laterality: Left;  . I&D EXTREMITY Left 12/19/2015   Procedure: I & D LEFT FOOT WITH BEADS ;  Surgeon: Meredith Pel, MD;  Location: [facility];  Service: Orthopedics;  Laterality: Left;  . I&D EXTREMITY Right 01/17/2016   Procedure: IRRIGATION AND DEBRIDEMENT RIGHT FOOT;  Surgeon: Newt Minion, MD;  Location: [place];  Service: Orthopedics;  Laterality: Right;  . INCISION AND DRAINAGE FOOT Right 01/17/2016  . INGUINAL HERNIA REPAIR Bilateral ~ 1983- ~ 1986  . TONSILLECTOMY  ~ 1985  SOCIAL HISTORY: Social History   Social History  . Marital status: Married    Spouse name: N/A  . Number of children: N/A  . Years of education: N/A   Occupational History  . Not on file.   Social History Main Topics  . Smoking status: Never Smoker  . Smokeless tobacco: Never Used  . Alcohol use No  . Drug use: No  . Sexual activity: No   Other Topics Concern  . Not on file   Social History  Narrative   Lives in [place] with wife.  Active but doesn't routinely exercise.    FAMILY HISTORY: Family History  Problem Relation Age of Onset  . Hypertension Mother   . Diabetes Mother   . Hyperlipidemia Mother   . Heart disease Mother     s/p pacemaker  . Diabetes Father   . Hypertension Father   . Stroke Father   . Heart attack Father     first MI @ 70.  . Stroke Brother     ALLERGIES:  is allergic to other and milk-related compounds.  MEDICATIONS:  Current Outpatient Prescriptions  Medication Sig Dispense Refill  . amLODipine (NORVASC) 10 MG tablet Take 1 tablet (10 mg total) by mouth daily. 30 tablet 0  . aspirin EC 325 MG tablet Take 1 tablet (325 mg total) by mouth daily. 30 tablet 0  . atorvastatin (LIPITOR) 80 MG tablet Take 1 tablet (80 mg total) by mouth every morning. 30 tablet 5  . Blood Glucose Monitoring Suppl (TRUE METRIX METER) w/Device KIT Use as directed 1 kit 0  . carvedilol (COREG) 12.5 MG tablet TAKE 1 TABLET BY MOUTH 2 TIMES DAILY WITH A MEAL 60 tablet 3  . divalproex (DEPAKOTE) 500 MG DR tablet Take 2 tablets (1,000 mg total) by mouth at bedtime. 30 tablet 5  . feeding supplement, GLUCERNA SHAKE, (GLUCERNA SHAKE) LIQD Take 237 mLs by mouth 2 (two) times daily between meals.  0  . ferrous sulfate 325 (65 FE) MG tablet Take 1 tablet (325 mg total) by mouth 2 (two) times daily with a meal. (Patient taking differently: Take 325 mg by mouth daily with breakfast. ) 60 tablet 5  . FLUoxetine (PROZAC) 10 MG tablet Take 1 tablet (10 mg total) by mouth every morning. 30 tablet 3  . gabapentin (NEURONTIN) 100 MG capsule Take 2 capsules (200 mg total) by mouth 3 (three) times daily. 90 capsule 0  . glipiZIDE (GLUCOTROL) 10 MG tablet Take 1 tablet (10 mg total) by mouth 2 (two) times daily before a meal. (Patient taking differently: Take 10 mg by mouth daily before breakfast. ) 60 tablet 5  . glucose blood (TRUE METRIX BLOOD GLUCOSE TEST) test strip Use as instructed 100  each 12  . HYDROcodone-acetaminophen (NORCO/VICODIN) 5-325 MG tablet Take 1 tablet by mouth every 6 (six) hours as needed for moderate pain. 12 tablet 0  . Insulin Glargine (LANTUS SOLOSTAR) 100 UNIT/ML Solostar Pen Inject 10 Units into the skin daily at 10 pm. 5 pen 3  . Insulin Pen Needle (B-D ULTRAFINE III SHORT PEN) 31G X 8 MM MISC 1 application by Does not apply route daily. 30 each 11  . linezolid (ZYVOX) 600 MG tablet Take 1 tablet (600 mg total) by mouth 2 (two) times daily. 14 tablet 0  . lisinopril (PRINIVIL,ZESTRIL) 40 MG tablet Take 1 tablet (40 mg total) by mouth daily. 30 tablet 5  . metFORMIN (GLUCOPHAGE) 1000 MG tablet Take 1 tablet (1,000 mg total) by  mouth 2 (two) times daily with a meal. 60 tablet 5  . mupirocin ointment (BACTROBAN) 2 % Apply topically daily. Tops of feet 22 g 0  . omeprazole (PRILOSEC) 20 MG capsule Take 1 capsule (20 mg total) by mouth daily. 30 capsule 5  . saccharomyces boulardii (FLORASTOR) 250 MG capsule Take 1 capsule (250 mg total) by mouth 2 (two) times daily. 60 capsule 3  . traZODone (DESYREL) 50 MG tablet Take 0.5-1 tablets (25-50 mg total) by mouth at bedtime as needed for sleep. 30 tablet 3  . TRUEPLUS LANCETS 28G MISC Use as directed 100 each 12   No current facility-administered medications for this visit.     REVIEW OF SYSTEMS:   Constitutional: Denies fevers, chills or abnormal night sweats Eyes: Denies blurriness of vision, double vision or watery eyes Ears, nose, mouth, throat, and face: Denies mucositis or sore throat Respiratory: Denies cough, dyspnea or wheezes Cardiovascular: Denies palpitation, chest discomfort or lower extremity swelling Gastrointestinal:  Denies nausea, heartburn or change in bowel habits Skin: Denies abnormal skin rashes Lymphatics: Denies new lymphadenopathy or easy bruising Neurological:Neuropathy and extremities Behavioral/Psych: Mood is stable, no new changes  All other systems were reviewed with the  patient and are negative.  PHYSICAL EXAMINATION: ECOG PERFORMANCE STATUS: 1 - Symptomatic but completely ambulatory  Vitals:   02/09/16 0851  BP: (!) 177/96  Pulse: 92  Resp: 19  Temp: 98.2 F (36.8 C)   Filed Weights   02/09/16 0851  Weight: 183 lb 12.8 oz (83.4 kg)    GENERAL:alert, no distress and comfortable SKIN: skin color, texture, turgor are normal, no rashes or significant lesions EYES: normal, conjunctiva are pink and non-injected, sclera clear OROPHARYNX:no exudate, no erythema and lips, buccal mucosa, and tongue normal  NECK: supple, thyroid normal size, non-tender, without nodularity LYMPH:  no palpable lymphadenopathy in the cervical, axillary or inguinal LUNGS: clear to auscultation and percussion with normal breathing effort HEART: regular rate & rhythm and no murmurs and no lower extremity edema ABDOMEN:abdomen soft, non-tender and normal bowel sounds Musculoskeletal:no cyanosis of digits and no clubbing  PSYCH: alert & oriented x 3 with fluent speech NEURO: no focal motor/sensory deficits   LABORATORY DATA:  I have reviewed the data as listed Lab Results  Component Value Date   WBC 7.5 02/09/2016   HGB 9.8 (L) 02/09/2016   HCT 30.7 (L) 02/09/2016   MCV 94.5 02/09/2016   PLT 296 02/09/2016   Lab Results  Component Value Date   NA 140 01/23/2016   K 4.3 01/23/2016   CL 105 01/23/2016   CO2 27 01/23/2016    RADIOGRAPHIC STUDIES: I have personally reviewed the radiological reports and agreed with the findings in the report.  ASSESSMENT AND PLAN:  Anemia of infection and chronic disease Patient with diabetes mellitus, essential hypertension, CHF, recent left foot cellulitis and abscess Hospitalization 01/11/2016 to 01/23/2016 Upper endoscopy and colonoscopy were normal Cardiac stress test 01/20/2016 was negative for ischemia [employer] Witness Iron studies reveal moderate iron deficiency In the hospital and he received IV iron and is currently  on oral iron.  Labs from today: Hemoglobin 9.8, reticulocyte count 33.48, 1%, ferritin 560, iron saturation 28%, normal WBC and normal platelet counts  Recommendation: 1. No indication for IV iron therapy. I recommended continuing oral iron 2. treat underlying infection from cellulitis 3. Better control of diabetes and hypertension 4. I'm checking erythropoietin level. If his erythropoietin level is below 50 we will consider giving him Aranesp injections.  Return to clinic 1 month to recheck on the response to treatment.  All questions were answered. The patient knows to call the clinic with any problems, questions or concerns.    Rulon Eisenmenger, MD 02/09/16

## 2016-02-10 LAB — ERYTHROPOIETIN: Erythropoietin: 17.4 m[IU]/mL (ref 2.6–18.5)

## 2016-02-10 LAB — VITAMIN B12: Vitamin B12: 534 pg/mL (ref 211–946)

## 2016-02-12 ENCOUNTER — Other Ambulatory Visit: Payer: Self-pay | Admitting: Family Medicine

## 2016-02-12 DIAGNOSIS — M79672 Pain in left foot: Secondary | ICD-10-CM

## 2016-02-12 MED FILL — IBUPROFEN 800 MG TABLET: 800 | 10 days supply | Qty: 30 | Fill #0

## 2016-02-12 MED FILL — DIVALPROEX SOD DR 500 MG TA: 500 | 30 days supply | Qty: 30 | Fill #5

## 2016-02-20 ENCOUNTER — Encounter (HOSPITAL_COMMUNITY): Payer: Self-pay | Admitting: *Deleted

## 2016-02-20 ENCOUNTER — Emergency Department (HOSPITAL_COMMUNITY): Payer: Medicaid Other

## 2016-02-20 ENCOUNTER — Inpatient Hospital Stay (HOSPITAL_COMMUNITY)
Admission: EM | Admit: 2016-02-20 | Discharge: 2016-02-23 | DRG: 920 | Disposition: A | Discharge status: Home / Self Care | Payer: Medicaid Other | Attending: Internal Medicine | Admitting: Internal Medicine

## 2016-02-20 DIAGNOSIS — F418 Other specified anxiety disorders: Secondary | ICD-10-CM | POA: Diagnosis present

## 2016-02-20 DIAGNOSIS — Z9119 Patient's noncompliance with other medical treatment and regimen: Secondary | ICD-10-CM

## 2016-02-20 DIAGNOSIS — IMO0002 Reserved for concepts with insufficient information to code with codable children: Secondary | ICD-10-CM | POA: Diagnosis present

## 2016-02-20 DIAGNOSIS — D509 Iron deficiency anemia, unspecified: Secondary | ICD-10-CM | POA: Diagnosis present

## 2016-02-20 DIAGNOSIS — E559 Vitamin D deficiency, unspecified: Secondary | ICD-10-CM | POA: Diagnosis present

## 2016-02-20 DIAGNOSIS — L97519 Non-pressure chronic ulcer of other part of right foot with unspecified severity: Secondary | ICD-10-CM | POA: Diagnosis present

## 2016-02-20 DIAGNOSIS — T8131XD Disruption of external operation (surgical) wound, not elsewhere classified, subsequent encounter: Secondary | ICD-10-CM

## 2016-02-20 DIAGNOSIS — T8131XA Disruption of external operation (surgical) wound, not elsewhere classified, initial encounter: Principal | ICD-10-CM | POA: Diagnosis present

## 2016-02-20 DIAGNOSIS — Z8673 Personal history of transient ischemic attack (TIA), and cerebral infarction without residual deficits: Secondary | ICD-10-CM

## 2016-02-20 DIAGNOSIS — I5032 Chronic diastolic (congestive) heart failure: Secondary | ICD-10-CM | POA: Diagnosis present

## 2016-02-20 DIAGNOSIS — E119 Type 2 diabetes mellitus without complications: Secondary | ICD-10-CM

## 2016-02-20 DIAGNOSIS — E1151 Type 2 diabetes mellitus with diabetic peripheral angiopathy without gangrene: Secondary | ICD-10-CM | POA: Diagnosis present

## 2016-02-20 DIAGNOSIS — F319 Bipolar disorder, unspecified: Secondary | ICD-10-CM | POA: Diagnosis present

## 2016-02-20 DIAGNOSIS — E1140 Type 2 diabetes mellitus with diabetic neuropathy, unspecified: Secondary | ICD-10-CM | POA: Diagnosis present

## 2016-02-20 DIAGNOSIS — E1165 Type 2 diabetes mellitus with hyperglycemia: Secondary | ICD-10-CM | POA: Diagnosis present

## 2016-02-20 DIAGNOSIS — I110 Hypertensive heart disease with heart failure: Secondary | ICD-10-CM | POA: Diagnosis present

## 2016-02-20 DIAGNOSIS — E118 Type 2 diabetes mellitus with unspecified complications: Secondary | ICD-10-CM

## 2016-02-20 DIAGNOSIS — K219 Gastro-esophageal reflux disease without esophagitis: Secondary | ICD-10-CM | POA: Diagnosis present

## 2016-02-20 DIAGNOSIS — L97513 Non-pressure chronic ulcer of other part of right foot with necrosis of muscle: Secondary | ICD-10-CM

## 2016-02-20 DIAGNOSIS — L03115 Cellulitis of right lower limb: Secondary | ICD-10-CM | POA: Diagnosis present

## 2016-02-20 DIAGNOSIS — E785 Hyperlipidemia, unspecified: Secondary | ICD-10-CM | POA: Diagnosis present

## 2016-02-20 DIAGNOSIS — T8130XA Disruption of wound, unspecified, initial encounter: Secondary | ICD-10-CM | POA: Diagnosis present

## 2016-02-20 DIAGNOSIS — Z7982 Long term (current) use of aspirin: Secondary | ICD-10-CM

## 2016-02-20 DIAGNOSIS — I10 Essential (primary) hypertension: Secondary | ICD-10-CM | POA: Diagnosis present

## 2016-02-20 DIAGNOSIS — Z7984 Long term (current) use of oral hypoglycemic drugs: Secondary | ICD-10-CM

## 2016-02-20 DIAGNOSIS — Z23 Encounter for immunization: Secondary | ICD-10-CM

## 2016-02-20 DIAGNOSIS — G4730 Sleep apnea, unspecified: Secondary | ICD-10-CM | POA: Diagnosis present

## 2016-02-20 DIAGNOSIS — E11621 Type 2 diabetes mellitus with foot ulcer: Secondary | ICD-10-CM | POA: Diagnosis present

## 2016-02-20 HISTORY — DX: Gastrointestinal hemorrhage, unspecified: K92.2

## 2016-02-20 HISTORY — DX: Occlusion and stenosis of unspecified carotid artery: I65.29

## 2016-02-20 LAB — CBC WITH DIFFERENTIAL/PLATELET
BASOS PCT: 0 %
Basophils Absolute: 0 10*3/uL (ref 0.0–0.1)
Eosinophils Absolute: 0.2 10*3/uL (ref 0.0–0.7)
Eosinophils Relative: 2 %
HEMATOCRIT: 28.7 % — AB (ref 39.0–52.0)
HEMOGLOBIN: 9.1 g/dL — AB (ref 13.0–17.0)
Lymphocytes Relative: 19 %
Lymphs Abs: 2.1 10*3/uL (ref 0.7–4.0)
MCH: 29.9 pg (ref 26.0–34.0)
MCHC: 31.7 g/dL (ref 30.0–36.0)
MCV: 94.4 fL (ref 78.0–100.0)
MONOS PCT: 5 %
Monocytes Absolute: 0.5 10*3/uL (ref 0.1–1.0)
NEUTROS ABS: 7.9 10*3/uL — AB (ref 1.7–7.7)
NEUTROS PCT: 74 %
Platelets: 278 10*3/uL (ref 150–400)
RBC: 3.04 MIL/uL — ABNORMAL LOW (ref 4.22–5.81)
RDW: 14.4 % (ref 11.5–15.5)
WBC: 10.6 10*3/uL — ABNORMAL HIGH (ref 4.0–10.5)

## 2016-02-20 LAB — COMPREHENSIVE METABOLIC PANEL
ALBUMIN: 3.5 g/dL (ref 3.5–5.0)
ALK PHOS: 57 U/L (ref 38–126)
ALT: 14 U/L — ABNORMAL LOW (ref 17–63)
AST: 18 U/L (ref 15–41)
Anion gap: 6 (ref 5–15)
BILIRUBIN TOTAL: 0.4 mg/dL (ref 0.3–1.2)
BUN: 25 mg/dL — AB (ref 6–20)
CALCIUM: 9.1 mg/dL (ref 8.9–10.3)
CO2: 25 mmol/L (ref 22–32)
CREATININE: 1.11 mg/dL (ref 0.61–1.24)
Chloride: 107 mmol/L (ref 101–111)
GFR calc Af Amer: >60 mL/min (ref >60)
GFR calc non Af Amer: >60 mL/min (ref >60)
GLUCOSE: 135 mg/dL — AB (ref 65–99)
Potassium: 4.5 mmol/L (ref 3.5–5.1)
Sodium: 138 mmol/L (ref 135–145)
TOTAL PROTEIN: 6.7 g/dL (ref 6.5–8.1)

## 2016-02-20 LAB — SEDIMENTATION RATE: Sed Rate: 48 mm/hr — ABNORMAL HIGH (ref 0–16)

## 2016-02-20 LAB — C-REACTIVE PROTEIN: CRP: 0.5 mg/dL (ref <1.0)

## 2016-02-20 NOTE — ED Triage Notes (Signed)
Pt reports that he has surgery in June on his foot. Pt states that he has had pain, bleeding, swelling starting today. Pt states that he has been seeing ortho since surgery. Pt noted to have dressing that has been soaked through with blood in 30 minutes,. Pt reports decreased sensation in toes starting today. Warmth and swelling also noted.

## 2016-02-21 ENCOUNTER — Encounter (HOSPITAL_COMMUNITY): Payer: Self-pay | Admitting: Internal Medicine

## 2016-02-21 DIAGNOSIS — E11621 Type 2 diabetes mellitus with foot ulcer: Secondary | ICD-10-CM

## 2016-02-21 DIAGNOSIS — L97513 Non-pressure chronic ulcer of other part of right foot with necrosis of muscle: Secondary | ICD-10-CM | POA: Diagnosis not present

## 2016-02-21 DIAGNOSIS — E1159 Type 2 diabetes mellitus with other circulatory complications: Secondary | ICD-10-CM | POA: Diagnosis not present

## 2016-02-21 DIAGNOSIS — D509 Iron deficiency anemia, unspecified: Secondary | ICD-10-CM | POA: Diagnosis present

## 2016-02-21 DIAGNOSIS — I5032 Chronic diastolic (congestive) heart failure: Secondary | ICD-10-CM | POA: Diagnosis present

## 2016-02-21 DIAGNOSIS — E1140 Type 2 diabetes mellitus with diabetic neuropathy, unspecified: Secondary | ICD-10-CM | POA: Diagnosis not present

## 2016-02-21 DIAGNOSIS — Z9119 Patient's noncompliance with other medical treatment and regimen: Secondary | ICD-10-CM | POA: Diagnosis not present

## 2016-02-21 DIAGNOSIS — E1151 Type 2 diabetes mellitus with diabetic peripheral angiopathy without gangrene: Secondary | ICD-10-CM | POA: Diagnosis present

## 2016-02-21 DIAGNOSIS — L97519 Non-pressure chronic ulcer of other part of right foot with unspecified severity: Secondary | ICD-10-CM | POA: Diagnosis present

## 2016-02-21 DIAGNOSIS — K219 Gastro-esophageal reflux disease without esophagitis: Secondary | ICD-10-CM | POA: Diagnosis present

## 2016-02-21 DIAGNOSIS — L03115 Cellulitis of right lower limb: Secondary | ICD-10-CM | POA: Diagnosis present

## 2016-02-21 DIAGNOSIS — E785 Hyperlipidemia, unspecified: Secondary | ICD-10-CM | POA: Diagnosis present

## 2016-02-21 DIAGNOSIS — Z23 Encounter for immunization: Secondary | ICD-10-CM | POA: Diagnosis not present

## 2016-02-21 DIAGNOSIS — Z7984 Long term (current) use of oral hypoglycemic drugs: Secondary | ICD-10-CM | POA: Diagnosis not present

## 2016-02-21 DIAGNOSIS — Z8673 Personal history of transient ischemic attack (TIA), and cerebral infarction without residual deficits: Secondary | ICD-10-CM | POA: Diagnosis not present

## 2016-02-21 DIAGNOSIS — Z7982 Long term (current) use of aspirin: Secondary | ICD-10-CM | POA: Diagnosis not present

## 2016-02-21 DIAGNOSIS — T8131XA Disruption of external operation (surgical) wound, not elsewhere classified, initial encounter: Secondary | ICD-10-CM | POA: Diagnosis present

## 2016-02-21 DIAGNOSIS — G4730 Sleep apnea, unspecified: Secondary | ICD-10-CM | POA: Diagnosis present

## 2016-02-21 DIAGNOSIS — M79671 Pain in right foot: Secondary | ICD-10-CM | POA: Diagnosis not present

## 2016-02-21 DIAGNOSIS — I110 Hypertensive heart disease with heart failure: Secondary | ICD-10-CM | POA: Diagnosis present

## 2016-02-21 DIAGNOSIS — E559 Vitamin D deficiency, unspecified: Secondary | ICD-10-CM | POA: Diagnosis present

## 2016-02-21 DIAGNOSIS — F319 Bipolar disorder, unspecified: Secondary | ICD-10-CM | POA: Diagnosis present

## 2016-02-21 DIAGNOSIS — F418 Other specified anxiety disorders: Secondary | ICD-10-CM | POA: Diagnosis not present

## 2016-02-21 LAB — GLUCOSE, CAPILLARY
GLUCOSE-CAPILLARY: 180 mg/dL — AB (ref 65–99)
GLUCOSE-CAPILLARY: 212 mg/dL — AB (ref 65–99)
Glucose-Capillary: 96 mg/dL (ref 65–99)
Glucose-Capillary: 246 mg/dL — ABNORMAL HIGH (ref 65–99)

## 2016-02-21 MED ORDER — OXYCODONE HCL 5 MG PO TABS
10.0000 mg | ORAL_TABLET | ORAL | Status: DC | PRN
  Administered 2016-02-21 – 2016-02-23 (×6): 10 mg via ORAL
  Filled 2016-02-21 (×7): qty 2

## 2016-02-21 MED ORDER — OXYCODONE HCL 5 MG PO TABS
5.0000 mg | ORAL_TABLET | ORAL | Status: DC | PRN
  Administered 2016-02-21: 5 mg via ORAL
  Filled 2016-02-21: qty 1

## 2016-02-21 MED ORDER — PIPERACILLIN-TAZOBACTAM 3.375 G IVPB 30 MIN
3.3750 g | Freq: Once | INTRAVENOUS | Status: AC
  Administered 2016-02-21: 3.375 g via INTRAVENOUS
  Filled 2016-02-21: qty 50

## 2016-02-21 MED ORDER — VANCOMYCIN HCL IN DEXTROSE 1-5 GM/200ML-% IV SOLN
1000.0000 mg | Freq: Two times a day (BID) | INTRAVENOUS | Status: DC
  Administered 2016-02-21 – 2016-02-23 (×4): 1000 mg via INTRAVENOUS
  Filled 2016-02-21 (×5): qty 200

## 2016-02-21 MED ORDER — SACCHAROMYCES BOULARDII 250 MG PO CAPS
250.0000 mg | ORAL_CAPSULE | Freq: Two times a day (BID) | ORAL | Status: DC
  Administered 2016-02-21 – 2016-02-23 (×5): 250 mg via ORAL
  Filled 2016-02-21 (×5): qty 1

## 2016-02-21 MED ORDER — INSULIN ASPART 100 UNIT/ML ~~LOC~~ SOLN
0.0000 [IU] | Freq: Three times a day (TID) | SUBCUTANEOUS | Status: DC
  Administered 2016-02-21: 3 [IU] via SUBCUTANEOUS
  Administered 2016-02-21: 5 [IU] via SUBCUTANEOUS
  Administered 2016-02-22: 3 [IU] via SUBCUTANEOUS

## 2016-02-21 MED ORDER — TRAMADOL HCL 50 MG PO TABS
100.0000 mg | ORAL_TABLET | Freq: Four times a day (QID) | ORAL | Status: DC | PRN
  Administered 2016-02-22: 100 mg via ORAL
  Filled 2016-02-21: qty 2

## 2016-02-21 MED ORDER — LISINOPRIL 40 MG PO TABS
40.0000 mg | ORAL_TABLET | Freq: Every day | ORAL | Status: DC
  Administered 2016-02-21 – 2016-02-23 (×3): 40 mg via ORAL
  Filled 2016-02-21 (×3): qty 1

## 2016-02-21 MED ORDER — ONDANSETRON HCL 4 MG/2ML IJ SOLN
4.0000 mg | Freq: Once | INTRAMUSCULAR | Status: AC
  Administered 2016-02-21: 4 mg via INTRAVENOUS
  Filled 2016-02-21: qty 2

## 2016-02-21 MED ORDER — DIVALPROEX SODIUM 500 MG PO DR TAB
1000.0000 mg | DELAYED_RELEASE_TABLET | Freq: Every day | ORAL | Status: DC
  Administered 2016-02-21 – 2016-02-22 (×2): 1000 mg via ORAL
  Filled 2016-02-21 (×2): qty 2

## 2016-02-21 MED ORDER — ENOXAPARIN SODIUM 40 MG/0.4ML ~~LOC~~ SOLN
40.0000 mg | SUBCUTANEOUS | Status: DC
  Administered 2016-02-21 – 2016-02-23 (×3): 40 mg via SUBCUTANEOUS
  Filled 2016-02-21 (×3): qty 0.4

## 2016-02-21 MED ORDER — SILVER SULFADIAZINE 1 % EX CREA
TOPICAL_CREAM | Freq: Every day | CUTANEOUS | Status: DC
  Administered 2016-02-21 – 2016-02-23 (×3): via TOPICAL
  Filled 2016-02-21: qty 85

## 2016-02-21 MED ORDER — SODIUM CHLORIDE 0.9 % IV SOLN
1500.0000 mg | Freq: Once | INTRAVENOUS | Status: AC
  Administered 2016-02-21: 1500 mg via INTRAVENOUS
  Filled 2016-02-21: qty 1500

## 2016-02-21 MED ORDER — ATORVASTATIN CALCIUM 80 MG PO TABS
80.0000 mg | ORAL_TABLET | Freq: Every day | ORAL | Status: DC
  Administered 2016-02-21 – 2016-02-22 (×2): 80 mg via ORAL
  Filled 2016-02-21 (×2): qty 1

## 2016-02-21 MED ORDER — FERROUS SULFATE 325 (65 FE) MG PO TABS
325.0000 mg | ORAL_TABLET | Freq: Every day | ORAL | Status: DC
  Administered 2016-02-21 – 2016-02-23 (×3): 325 mg via ORAL
  Filled 2016-02-21 (×3): qty 1

## 2016-02-21 MED ORDER — GLIPIZIDE 10 MG PO TABS
10.0000 mg | ORAL_TABLET | Freq: Every day | ORAL | Status: DC
  Administered 2016-02-21: 10 mg via ORAL
  Filled 2016-02-21: qty 2
  Filled 2016-02-21: qty 1

## 2016-02-21 MED ORDER — MORPHINE SULFATE (PF) 4 MG/ML IV SOLN
4.0000 mg | Freq: Once | INTRAVENOUS | Status: AC
  Administered 2016-02-21: 4 mg via INTRAVENOUS
  Filled 2016-02-21: qty 1

## 2016-02-21 MED ORDER — TRAZODONE HCL 50 MG PO TABS: 25.0000 mg | ORAL_TABLET | Freq: Every evening | ORAL | Status: DC | PRN

## 2016-02-21 MED ORDER — ASPIRIN EC 325 MG PO TBEC
325.0000 mg | DELAYED_RELEASE_TABLET | Freq: Every day | ORAL | Status: DC
  Administered 2016-02-21 – 2016-02-23 (×3): 325 mg via ORAL
  Filled 2016-02-21 (×4): qty 1

## 2016-02-21 MED ORDER — FLUOXETINE HCL 10 MG PO CAPS
10.0000 mg | ORAL_CAPSULE | Freq: Every day | ORAL | Status: DC
  Administered 2016-02-21 – 2016-02-23 (×3): 10 mg via ORAL
  Filled 2016-02-21 (×4): qty 1

## 2016-02-21 MED ORDER — KETOROLAC TROMETHAMINE 15 MG/ML IJ SOLN
15.0000 mg | Freq: Four times a day (QID) | INTRAMUSCULAR | Status: DC | PRN
Start: 2016-02-21 — End: 2016-02-23
  Administered 2016-02-21 – 2016-02-22 (×2): 15 mg via INTRAVENOUS
  Filled 2016-02-21 (×2): qty 1

## 2016-02-21 MED ORDER — INSULIN ASPART 100 UNIT/ML ~~LOC~~ SOLN
0.0000 [IU] | Freq: Every day | SUBCUTANEOUS | Status: DC
  Administered 2016-02-21: 2 [IU] via SUBCUTANEOUS

## 2016-02-21 MED ORDER — IBUPROFEN 200 MG PO TABS: 800.0000 mg | ORAL_TABLET | Freq: Three times a day (TID) | ORAL | Status: DC | PRN

## 2016-02-21 MED ORDER — PANTOPRAZOLE SODIUM 40 MG PO TBEC
40.0000 mg | DELAYED_RELEASE_TABLET | Freq: Every day | ORAL | Status: DC
  Administered 2016-02-21 – 2016-02-23 (×3): 40 mg via ORAL
  Filled 2016-02-21 (×3): qty 1

## 2016-02-21 MED ORDER — AMLODIPINE BESYLATE 10 MG PO TABS
10.0000 mg | ORAL_TABLET | Freq: Every day | ORAL | Status: DC
  Administered 2016-02-21 – 2016-02-23 (×3): 10 mg via ORAL
  Filled 2016-02-21 (×2): qty 1

## 2016-02-21 MED ORDER — CARVEDILOL 12.5 MG PO TABS
12.5000 mg | ORAL_TABLET | Freq: Two times a day (BID) | ORAL | Status: DC
  Administered 2016-02-21 – 2016-02-23 (×5): 12.5 mg via ORAL
  Filled 2016-02-21 (×5): qty 1

## 2016-02-21 MED ORDER — GABAPENTIN 100 MG PO CAPS
200.0000 mg | ORAL_CAPSULE | Freq: Three times a day (TID) | ORAL | Status: DC
  Administered 2016-02-21 – 2016-02-23 (×7): 200 mg via ORAL
  Filled 2016-02-21 (×7): qty 2

## 2016-02-21 MED ORDER — PIPERACILLIN-TAZOBACTAM 3.375 G IVPB
3.3750 g | Freq: Three times a day (TID) | INTRAVENOUS | Status: DC
  Administered 2016-02-21 – 2016-02-23 (×7): 3.375 g via INTRAVENOUS
  Filled 2016-02-21 (×10): qty 50

## 2016-02-21 MED ORDER — METFORMIN HCL 500 MG PO TABS: 1000.0000 mg | ORAL_TABLET | Freq: Two times a day (BID) | ORAL | Status: DC

## 2016-02-21 MED ORDER — SODIUM CHLORIDE 0.9 % IV BOLUS (SEPSIS)
1000.0000 mL | Freq: Once | INTRAVENOUS | Status: AC
  Administered 2016-02-21: 1000 mL via INTRAVENOUS

## 2016-02-21 NOTE — ED Provider Notes (Signed)
[facility] DEPT Provider Note   CSN: [id number] Arrival date & time: 02/20/16  1757     History   Chief Complaint Chief Complaint  Patient presents with  . Foot Pain    HPI Shane [name] is a 45 y.o. male.  HPI   Patient is a 45 year old male history of diabetes, hyperlipidemia, hypertension, anemia, peripheral vascular disease, diabetic ulcers to bilateral feet, status post left first and second toe amputation, he presents to the emergency Department complaining of bilateral foot pain and swelling, worse in the right foot than in the left that began yesterday. He states that he is supposed to be nonweightbearing on his right foot however yesterday he did walk and do yard work. He then had gradual onset of severe pain and new bleeding and drainage from bilateral dorsal foot wounds. The drainage is clear and bloody. He states that he has soaked through the gauze dressing in 30 minutes. His pain is rated 10 out of 10, worse with touch, movement or ambulation. He has taken ibuprofen without much relief. He has had sweats and subjective fever that began last night, and feels generally unwell. He has no other associated symptoms, including no shortness of breath, chest pain, near syncope, weakness, nausea, vomiting.  Past Medical History:  Diagnosis Date  . Anemia   . Bipolar disorder ([place])   . Chest pain    a. 2015 Reportedly normal stress test in [place].  Marland Kitchen Chronic diastolic CHF (congestive heart failure) (HCC)    a.03/2015 Echo: EF 55-60%, Gr 1 DD, mild MR, triv PR.  . Depression with anxiety   . GERD (gastroesophageal reflux disease)   . Hyperlipidemia   . Hypertension    a. 08/2014 Admitted with hypertensive urgency.  . Refusal of blood transfusions as patient is Jehovah's Witness   . Sleep apnea   . TIA (transient ischemic attack) 08/2014; 03/2015   a. 08/2014 in setting of hypertensive urgency.  . Type II diabetes mellitus ([place])    started insulin spring 2016  . Vitamin D  deficiency spring 2016    Patient Active Problem List   Diagnosis Date Noted  . Anemia, iron deficiency   . Hematochezia   . Diabetic ulcer of left foot associated with type 2 diabetes mellitus ([place])   . Swelling of foot joint   . Cellulitis of right ankle 01/12/2016  . Cellulitis of left ankle 01/12/2016  . Left foot infection 12/30/2015  . Diabetic foot ulcer ([place]) 12/30/2015  . Chronic diastolic CHF (congestive heart failure), NYHA class 1 ([place]) 12/30/2015  . Cellulitis of left foot   . Wound dehiscence   . Wound infection ([place]) 12/16/2015  . SIRS (systemic inflammatory response syndrome) ([place]) 12/16/2015  . Type 2 diabetes mellitus with vascular disease ([place]) 12/16/2015  . Pain of toe of left foot   . Nausea & vomiting 12/10/2015  . Numbness 12/10/2015  . Cellulitis and abscess, left foot 12/02/2015  . Foot infection 12/02/2015  . Depression with anxiety 12/01/2015  . Chest pain 12/01/2015  . Diabetic foot infection ([place])   . Left foot pain 11/24/2015  . Insomnia 11/21/2015  . Anxiety state 09/04/2015  . GERD (gastroesophageal reflux disease) 08/18/2015  . Amputated toe of left foot ([place]) 08/18/2015  . Lower GI bleed 07/04/2015  . Erectile dysfunction 07/04/2015  . Anemia of infection and chronic disease 04/24/2015  . AKI (acute kidney injury) ([place]) 04/24/2015  . Left arm weakness 04/02/2015  . Sensory disturbance 04/02/2015  . Essential hypertension  04/02/2015  . Hemispheric carotid artery syndrome   . HLD (hyperlipidemia)   . Internal carotid artery stenosis   . TIA (transient ischemic attack) 08/25/2014  . DM2 (diabetes mellitus, type 2) ([place]) 08/25/2014  . Headache     Past Surgical History:  Procedure Laterality Date  . AMPUTATION Left 08/03/2015   Procedure: AMPUTATION LEFT GREAT TOE;  Surgeon: Newt Minion, MD;  Location: [facility];  Service: Orthopedics;  Laterality: Left;  . AMPUTATION Left 12/02/2015   Procedure: amputation of left 2nd digit  .  APPLICATION OF WOUND VAC Left 12/19/2015   Procedure: APPLICATION OF WOUND VAC;  Surgeon: Meredith Pel, MD;  Location: [facility];  Service: Orthopedics;  Laterality: Left;  . CIRCUMCISION    . COLONOSCOPY N/A 01/22/2016   Procedure: COLONOSCOPY;  Surgeon: Gatha Mayer, MD;  Location: [facility];  Service: Endoscopy;  Laterality: N/A;  . ESOPHAGOGASTRODUODENOSCOPY N/A 01/19/2016   Procedure: ESOPHAGOGASTRODUODENOSCOPY (EGD);  Surgeon: Manus Gunning, MD;  Location: [facility];  Service: Gastroenterology;  Laterality: N/A;  . ESOPHAGOGASTRODUODENOSCOPY N/A 01/22/2016   Procedure: ESOPHAGOGASTRODUODENOSCOPY (EGD);  Surgeon: Gatha Mayer, MD;  Location[facility] ENDOSCOPY;  Service: Endoscopy;  Laterality: N/A;  . I&D EXTREMITY Left 12/02/2015   Procedure: IRRIGATION AND DEBRIDEMENT OF FOOT; LEFT SECOND TOE AMPUTATION;  Surgeon: Meredith Pel, MD;  Location: [facility];  Service: Orthopedics;  Laterality: Left;  . I&D EXTREMITY Left 12/19/2015   Procedure: I & D LEFT FOOT WITH BEADS ;  Surgeon: Meredith Pel, MD;  Location: [facility];  Service: Orthopedics;  Laterality: Left;  . I&D EXTREMITY Right 01/17/2016   Procedure: IRRIGATION AND DEBRIDEMENT RIGHT FOOT;  Surgeon: Newt Minion, MD;  Location: [facility];  Service: Orthopedics;  Laterality: Right;  . INCISION AND DRAINAGE FOOT Right 01/17/2016  . INGUINAL HERNIA REPAIR Bilateral ~ 1983- ~ 1986  . TONSILLECTOMY  ~ 1985       Home Medications    Prior to Admission medications   Medication Sig Start Date End Date Taking? Authorizing Provider  amLODipine (NORVASC) 10 MG tablet Take 1 tablet (10 mg total) by mouth daily. 01/23/16  Yes Hosie Poisson, MD  aspirin EC 325 MG tablet Take 1 tablet (325 mg total) by mouth daily. 09/04/15  Yes Maren Reamer, MD  atorvastatin (LIPITOR) 80 MG tablet Take 1 tablet (80 mg total) by mouth every morning. 08/18/15  Yes Josalyn Funches, MD  carvedilol (COREG) 12.5 MG tablet TAKE 1 TABLET BY MOUTH 2 TIMES DAILY  WITH A MEAL 01/30/16  Yes Josalyn Funches, MD  divalproex (DEPAKOTE) 500 MG DR tablet Take 2 tablets (1,000 mg total) by mouth at bedtime. 12/04/15  Yes Venetia Maxon Rama, MD  ferrous sulfate 325 (65 FE) MG tablet Take 1 tablet (325 mg total) by mouth 2 (two) times daily with a meal. Patient taking differently: Take 325 mg by mouth daily with breakfast.  08/18/15  Yes Josalyn Funches, MD  FLUoxetine (PROZAC) 10 MG tablet Take 1 tablet (10 mg total) by mouth every morning. 01/02/16  Yes Oswald Hillock, MD  gabapentin (NEURONTIN) 100 MG capsule Take 2 capsules (200 mg total) by mouth 3 (three) times daily. 01/23/16  Yes Hosie Poisson, MD  glipiZIDE (GLUCOTROL) 10 MG tablet Take 1 tablet (10 mg total) by mouth 2 (two) times daily before a meal. Patient taking differently: Take 10 mg by mouth daily before breakfast.  08/18/15  Yes Josalyn Funches, MD  glucose blood (TRUE METRIX BLOOD GLUCOSE TEST)  test strip Use as instructed 12/29/15  Yes Josalyn Funches, MD  HYDROcodone-acetaminophen (NORCO/VICODIN) 5-325 MG tablet Take 1 tablet by mouth every 6 (six) hours as needed for moderate pain. 01/23/16  Yes Hosie Poisson, MD  ibuprofen (ADVIL,MOTRIN) 800 MG tablet TAKE 1 TABLET BY MOUTH EVERY 8 HOURS AS NEEDED. Patient taking differently: TAKE 1 TABLET BY MOUTH EVERY 8 HOURS AS NEEDED FOR PAIN 02/12/16  Yes Josalyn Funches, MD  lisinopril (PRINIVIL,ZESTRIL) 40 MG tablet Take 1 tablet (40 mg total) by mouth daily. 08/18/15  Yes Boykin Nearing, MD  metFORMIN (GLUCOPHAGE) 1000 MG tablet Take 1 tablet (1,000 mg total) by mouth 2 (two) times daily with a meal. 12/22/15  Yes Ripudeep K Rai, MD  omeprazole (PRILOSEC) 20 MG capsule Take 1 capsule (20 mg total) by mouth daily. 08/18/15  Yes Josalyn Funches, MD  saccharomyces boulardii (FLORASTOR) 250 MG capsule Take 1 capsule (250 mg total) by mouth 2 (two) times daily. 01/15/16  Yes Janece Canterbury, MD  traZODone (DESYREL) 50 MG tablet Take 0.5-1 tablets (25-50 mg total) by mouth at bedtime  as needed for sleep. 11/21/15  Yes Boykin Nearing, MD  TRUEPLUS LANCETS 28G MISC Use as directed 12/29/15  Yes Boykin Nearing, MD    Family History Family History  Problem Relation Age of Onset  . Hypertension Mother   . Diabetes Mother   . Hyperlipidemia Mother   . Heart disease Mother     s/p pacemaker  . Diabetes Father   . Hypertension Father   . Stroke Father   . Heart attack Father     first MI @ 37.  . Stroke Brother     Social History Social History  Substance Use Topics  . Smoking status: Never Smoker  . Smokeless tobacco: Never Used  . Alcohol use No     Allergies   Other and Milk-related compounds   Review of Systems Review of Systems  All other systems reviewed and are negative.    Physical Exam Updated Vital Signs BP 154/93   Pulse 88   Temp 98 F (36.7 C) (Oral)   Resp 18   Ht 5\' 8"  (1.727 m)   Wt 78.5 kg   SpO2 99%   BMI 26.30 kg/m   Physical Exam  Constitutional: He is oriented to person, place, and time. He appears well-developed and well-nourished. No distress.  HENT:  Head: Normocephalic and atraumatic.  Right Ear: External ear normal.  Left Ear: External ear normal.  Nose: Nose normal.  Mouth/Throat: Oropharynx is clear and moist. No oropharyngeal exudate.  Eyes: Conjunctivae and EOM are normal. Pupils are equal, round, and reactive to light. Right eye exhibits no discharge. Left eye exhibits no discharge. No scleral icterus.  Neck: Normal range of motion. Neck supple. No tracheal deviation present.  Cardiovascular: Normal rate and regular rhythm.   Pulmonary/Chest: Effort normal and breath sounds normal. No stridor. No respiratory distress.  Musculoskeletal: He exhibits edema and tenderness.  Lymphadenopathy:    He has no cervical adenopathy.  Neurological: He is alert and oriented to person, place, and time. He exhibits normal muscle tone. Coordination normal.  Skin: Skin is warm. No rash noted. He is not diaphoretic. There is  erythema. No pallor.  Psychiatric: He has a normal mood and affect. His behavior is normal. Judgment and thought content normal.  Nursing note and vitals reviewed.        ED Treatments / Results  Labs (all labs ordered are listed, but only abnormal results are  displayed) Labs Reviewed  CBC WITH DIFFERENTIAL/PLATELET - Abnormal; Notable for the following:       Result Value   WBC 10.6 (*)    RBC 3.04 (*)    Hemoglobin 9.1 (*)    HCT 28.7 (*)    Neutro Abs 7.9 (*)    All other components within normal limits  COMPREHENSIVE METABOLIC PANEL - Abnormal; Notable for the following:    Glucose, Bld 135 (*)    BUN 25 (*)    ALT 14 (*)    All other components within normal limits  SEDIMENTATION RATE - Abnormal; Notable for the following:    Sed Rate 48 (*)    All other components within normal limits  C-REACTIVE PROTEIN    EKG  EKG Interpretation None       Radiology Dg Foot Complete Right  Result Date: 02/20/2016 CLINICAL DATA:  Right foot pain. EXAM: RIGHT FOOT COMPLETE - 3+ VIEW COMPARISON:  Radiographs of January 11, 2016. FINDINGS: There is no evidence of fracture or dislocation. There is no evidence of arthropathy or other focal bone abnormality. Vascular calcifications are noted. Dressing is seen overlying dorsal soft tissues. IMPRESSION: No definite osseous abnormality seen. Dorsal soft tissue swelling is noted. Electronically Signed   By: Marijo Conception, M.D.   On: 02/20/2016 19:15    Procedures Procedures (including critical care time)  Medications Ordered in ED Medications  piperacillin-tazobactam (ZOSYN) IVPB 3.375 g (not administered)  sodium chloride 0.9 % bolus 1,000 mL (not administered)  ondansetron (ZOFRAN) injection 4 mg (not administered)  morphine 4 MG/ML injection 4 mg (not administered)     Initial Impression / Assessment and Plan / ED Course  I have reviewed the triage vital signs and the nursing notes.  Pertinent labs & imaging results that  were available during my care of the patient were reviewed by me and considered in my medical decision making (see chart for details).  Clinical Course  The patient with diabetes, peripheral vascular disease, also bilaterally and feet, with rapid onset yesterday of right foot pain and swelling with bleeding and clear to bloody drainage from wound on dorsum of his foot. He has stable wound healing on his left foot. He states he's been working with Dr. Sharol Given, and wound clinic for several months, however he ambulated yesterday when he was instructed not to, and had severe pain and swelling and drainage in his right foot.   Appears concerning for cellulitis, he is high risk given his diabetes, multiple ulcers and abscesses. Basic lab work is pertinent for mild leukocytosis, plain films of right foot pertinent for dorsal soft tissue swelling, no evidence of arthropathy or focal bone abnormality, no fracture dislocation.  Patient will be admitted by Dr. Joette Catching for cellulitis, wound culture obtained of right foot ulcer and patient was given vague and Zosyn IV.  Will need orthopedic consult in the morning.     Final Clinical Impressions(s) / ED Diagnoses   Final diagnoses:  Cellulitis of right lower extremity    New Prescriptions New Prescriptions   No medications on file     Delsa Grana, PA-C 02/22/16 0545    Merryl Hacker, MD 02/23/16 [phone]

## 2016-02-21 NOTE — H&P (Signed)
History and Physical    Shane [name] [id number] DOB: [birth year] DOA: 02/20/2016   PCP: Minerva Ends, MD Chief Complaint:  Chief Complaint  Patient presents with  . Foot Pain    HPI: Shane [name] is a 45 y.o. male with medical history significant of diabetes, hyperlipidemia, hypertension, anemia, peripheral vascular disease, diabetic ulcers to bilateral feet, status post left first and second toe amputation.  In early July he developed cellulitis and abscess of his LEFT foot dorsum.  This was ultimately treated with I+D followed by wound dehiscence, surgical wound vac.  This done by Dr. Marlou Sa.  In late July despite ongoing slow healing of the LEFT foot wound (which has continued through today), he developed cellulitis and abscess of his RIGHT foot dorsum.  This was also treated with I+D, he was discharged on 8/8, he followed up with Dr. Sharol Given (who performed this surgery), and had stitches removed in office.  After stitches were removed the wound began to dehiss and he has been seeing wound care since that time.  He is supposed to be NWB on that foot.  Yesterday, however, he was outside bearing weight on the foot and doing yard work (which he now admits he wasn't supposed to be doing).  He then had gradual onset of severe pain and now bleeding and drainage from the foot wound on the RIGHT foot.  Pain is severe, 10/10 worse with touch, movement, ambulation.  Sweats and subjective fever that onset last night.  ED Course: Started on zosyn and vanc.  Review of Systems: As per HPI otherwise 10 point review of systems negative.    Past Medical History:  Diagnosis Date  . Anemia   . Bipolar disorder ([place])   . Chest pain    a. 2015 Reportedly normal stress test in [place].  Marland Kitchen Chronic diastolic CHF (congestive heart failure) (HCC)    a.03/2015 Echo: EF 55-60%, Gr 1 DD, mild MR, triv PR.  . Depression with anxiety   . GERD (gastroesophageal reflux disease)   . Hyperlipidemia   .  Hypertension    a. 08/2014 Admitted with hypertensive urgency.  . Refusal of blood transfusions as patient is Jehovah's Witness   . Sleep apnea   . TIA (transient ischemic attack) 08/2014; 03/2015   a. 08/2014 in setting of hypertensive urgency.  . Type II diabetes mellitus ([place])    started insulin spring 2016  . Vitamin D deficiency spring 2016    Past Surgical History:  Procedure Laterality Date  . AMPUTATION Left 08/03/2015   Procedure: AMPUTATION LEFT GREAT TOE;  Surgeon: Newt Minion, MD;  Location: [facility];  Service: Orthopedics;  Laterality: Left;  . AMPUTATION Left 12/02/2015   Procedure: amputation of left 2nd digit  . APPLICATION OF WOUND VAC Left 12/19/2015   Procedure: APPLICATION OF WOUND VAC;  Surgeon: Meredith Pel, MD;  Location: [facility];  Service: Orthopedics;  Laterality: Left;  . CIRCUMCISION    . COLONOSCOPY N/A 01/22/2016   Procedure: COLONOSCOPY;  Surgeon: Gatha Mayer, MD;  Location: [facility];  Service: Endoscopy;  Laterality: N/A;  . ESOPHAGOGASTRODUODENOSCOPY N/A 01/19/2016   Procedure: ESOPHAGOGASTRODUODENOSCOPY (EGD);  Surgeon: Manus Gunning, MD;  Location: [facility];  Service: Gastroenterology;  Laterality: N/A;  . ESOPHAGOGASTRODUODENOSCOPY N/A 01/22/2016   Procedure: ESOPHAGOGASTRODUODENOSCOPY (EGD);  Surgeon: Gatha Mayer, MD;  [facility] ENDOSCOPY;  Service: Endoscopy;  Laterality: N/A;  . I&D EXTREMITY Left 12/02/2015   Procedure: IRRIGATION AND DEBRIDEMENT OF FOOT; LEFT SECOND TOE  AMPUTATION;  Surgeon: Meredith Pel, MD;  Location: [facility];  Service: Orthopedics;  Laterality: Left;  . I&D EXTREMITY Left 12/19/2015   Procedure: I & D LEFT FOOT WITH BEADS ;  Surgeon: Meredith Pel, MD;  Location: [facility];  Service: Orthopedics;  Laterality: Left;  . I&D EXTREMITY Right 01/17/2016   Procedure: IRRIGATION AND DEBRIDEMENT RIGHT FOOT;  Surgeon: Newt Minion, MD;  Location: [facility];  Service: Orthopedics;  Laterality: Right;  . INCISION AND  DRAINAGE FOOT Right 01/17/2016  . INGUINAL HERNIA REPAIR Bilateral ~ 1983- ~ 1986  . TONSILLECTOMY  ~ 1985     reports that he has never smoked. He has never used smokeless tobacco. He reports that he does not drink alcohol or use drugs.  Allergies  Allergen Reactions  . Other Other (See Comments)    Red meat causes stomach pains, bloating and diarrhea  . Milk-Related Compounds Diarrhea and Other (See Comments)    Any dairy products  - diarrhea and bloating    Family History  Problem Relation Age of Onset  . Hypertension Mother   . Diabetes Mother   . Hyperlipidemia Mother   . Heart disease Mother     s/p pacemaker  . Diabetes Father   . Hypertension Father   . Stroke Father   . Heart attack Father     first MI @ 83.  . Stroke Brother       Prior to Admission medications   Medication Sig Start Date End Date Taking? Authorizing Provider  amLODipine (NORVASC) 10 MG tablet Take 1 tablet (10 mg total) by mouth daily. 01/23/16  Yes Hosie Poisson, MD  aspirin EC 325 MG tablet Take 1 tablet (325 mg total) by mouth daily. 09/04/15  Yes Maren Reamer, MD  atorvastatin (LIPITOR) 80 MG tablet Take 1 tablet (80 mg total) by mouth every morning. 08/18/15  Yes Josalyn Funches, MD  carvedilol (COREG) 12.5 MG tablet TAKE 1 TABLET BY MOUTH 2 TIMES DAILY WITH A MEAL 01/30/16  Yes Josalyn Funches, MD  divalproex (DEPAKOTE) 500 MG DR tablet Take 2 tablets (1,000 mg total) by mouth at bedtime. 12/04/15  Yes Venetia Maxon Rama, MD  ferrous sulfate 325 (65 FE) MG tablet Take 1 tablet (325 mg total) by mouth 2 (two) times daily with a meal. Patient taking differently: Take 325 mg by mouth daily with breakfast.  08/18/15  Yes Josalyn Funches, MD  FLUoxetine (PROZAC) 10 MG tablet Take 1 tablet (10 mg total) by mouth every morning. 01/02/16  Yes Oswald Hillock, MD  gabapentin (NEURONTIN) 100 MG capsule Take 2 capsules (200 mg total) by mouth 3 (three) times daily. 01/23/16  Yes Hosie Poisson, MD  glipiZIDE  (GLUCOTROL) 10 MG tablet Take 1 tablet (10 mg total) by mouth 2 (two) times daily before a meal. Patient taking differently: Take 10 mg by mouth daily before breakfast.  08/18/15  Yes Josalyn Funches, MD  glucose blood (TRUE METRIX BLOOD GLUCOSE TEST) test strip Use as instructed 12/29/15  Yes Josalyn Funches, MD  HYDROcodone-acetaminophen (NORCO/VICODIN) 5-325 MG tablet Take 1 tablet by mouth every 6 (six) hours as needed for moderate pain. 01/23/16  Yes Hosie Poisson, MD  ibuprofen (ADVIL,MOTRIN) 800 MG tablet TAKE 1 TABLET BY MOUTH EVERY 8 HOURS AS NEEDED. Patient taking differently: TAKE 1 TABLET BY MOUTH EVERY 8 HOURS AS NEEDED FOR PAIN 02/12/16  Yes Josalyn Funches, MD  lisinopril (PRINIVIL,ZESTRIL) 40 MG tablet Take 1 tablet (40 mg total) by mouth  daily. 08/18/15  Yes Boykin Nearing, MD  metFORMIN (GLUCOPHAGE) 1000 MG tablet Take 1 tablet (1,000 mg total) by mouth 2 (two) times daily with a meal. 12/22/15  Yes Ripudeep K Rai, MD  omeprazole (PRILOSEC) 20 MG capsule Take 1 capsule (20 mg total) by mouth daily. 08/18/15  Yes Josalyn Funches, MD  saccharomyces boulardii (FLORASTOR) 250 MG capsule Take 1 capsule (250 mg total) by mouth 2 (two) times daily. 01/15/16  Yes Janece Canterbury, MD  traZODone (DESYREL) 50 MG tablet Take 0.5-1 tablets (25-50 mg total) by mouth at bedtime as needed for sleep. 11/21/15  Yes Boykin Nearing, MD  TRUEPLUS LANCETS 28G MISC Use as directed 12/29/15  Yes Boykin Nearing, MD    Physical Exam: Vitals:   02/21/16 0100 02/21/16 0115 02/21/16 0130 02/21/16 0145  BP: 161/99 165/98 168/97 152/87  Pulse: 86 89 91 89  Resp:      Temp:      TempSrc:      SpO2: 100% 100% 98% 97%  Weight:      Height:          Constitutional: NAD, calm, comfortable Eyes: PERRL, lids and conjunctivae normal ENMT: Mucous membranes are moist. Posterior pharynx clear of any exudate or lesions.Normal dentition.  Neck: normal, supple, no masses, no thyromegaly Respiratory: clear to auscultation  bilaterally, no wheezing, no crackles. Normal respiratory effort. No accessory muscle use.  Cardiovascular: Regular rate and rhythm, no murmurs / rubs / gallops. No extremity edema. Cannot palpate DP on the R side due to massive wound in the way. No carotid bruits.  Abdomen: no tenderness, no masses palpated. No hepatosplenomegaly. Bowel sounds positive.  Musculoskeletal: no clubbing / cyanosis. No joint deformity upper and lower extremities. Good ROM, no contractures. Normal muscle tone.  Skin:    Neurologic: CN 2-12 grossly intact. Sensation intact, DTR normal. Strength 5/5 in all 4.  Psychiatric: Normal judgment and insight. Alert and oriented x 3. Normal mood.    Labs on Admission: I have personally reviewed following labs and imaging studies  CBC:  Recent Labs Lab 02/20/16 1810  WBC 10.6*  NEUTROABS 7.9*  HGB 9.1*  HCT 28.7*  MCV 94.4  PLT 0000000   Basic Metabolic Panel:  Recent Labs Lab 02/20/16 1810  NA 138  K 4.5  CL 107  CO2 25  GLUCOSE 135*  BUN 25*  CREATININE 1.11  CALCIUM 9.1   GFR: Estimated Creatinine Clearance: 80.5 mL/min (by C-G formula based on SCr of 1.11 mg/dL). Liver Function Tests:  Recent Labs Lab 02/20/16 1810  AST 18  ALT 14*  ALKPHOS 57  BILITOT 0.4  PROT 6.7  ALBUMIN 3.5   No results for input(s): LIPASE, AMYLASE in the last 168 hours. No results for input(s): AMMONIA in the last 168 hours. Coagulation Profile: No results for input(s): INR, PROTIME in the last 168 hours. Cardiac Enzymes: No results for input(s): CKTOTAL, CKMB, CKMBINDEX, TROPONINI in the last 168 hours. BNP (last 3 results) No results for input(s): PROBNP in the last 8760 hours. HbA1C: No results for input(s): HGBA1C in the last 72 hours. CBG: No results for input(s): GLUCAP in the last 168 hours. Lipid Profile: No results for input(s): CHOL, HDL, LDLCALC, TRIG, CHOLHDL, LDLDIRECT in the last 72 hours. Thyroid Function Tests: No results for input(s): TSH,  T4TOTAL, FREET4, T3FREE, THYROIDAB in the last 72 hours. Anemia Panel: No results for input(s): VITAMINB12, FOLATE, FERRITIN, TIBC, IRON, RETICCTPCT in the last 72 hours. Urine analysis:    Component  Value Date/Time   COLORURINE YELLOW 01/12/2016 1756   APPEARANCEUR CLEAR 01/12/2016 1756   LABSPEC 1.022 01/12/2016 1756   PHURINE 6.0 01/12/2016 1756   GLUCOSEU >1000 (A) 01/12/2016 1756   HGBUR NEGATIVE 01/12/2016 1756   BILIRUBINUR NEGATIVE 01/12/2016 1756   KETONESUR NEGATIVE 01/12/2016 1756   PROTEINUR 30 (A) 01/12/2016 1756   UROBILINOGEN 0.2 04/24/2015 1056   NITRITE NEGATIVE 01/12/2016 1756   LEUKOCYTESUR NEGATIVE 01/12/2016 1756   Sepsis Labs: @LABRCNTIP (procalcitonin:4,lacticidven:4) )No results found for this or any previous visit (from the past 240 hour(s)).   Radiological Exams on Admission: Dg Foot Complete Right  Result Date: 02/20/2016 CLINICAL DATA:  Right foot pain. EXAM: RIGHT FOOT COMPLETE - 3+ VIEW COMPARISON:  Radiographs of January 11, 2016. FINDINGS: There is no evidence of fracture or dislocation. There is no evidence of arthropathy or other focal bone abnormality. Vascular calcifications are noted. Dressing is seen overlying dorsal soft tissues. IMPRESSION: No definite osseous abnormality seen. Dorsal soft tissue swelling is noted. Electronically Signed   By: Marijo Conception, M.D.   On: 02/20/2016 19:15    EKG: Independently reviewed.  Assessment/Plan Principal Problem:   Diabetic ulcer of right foot associated with type 2 diabetes mellitus, with necrosis of muscle (HCC) Active Problems:   DM2 (diabetes mellitus, type 2) ([place])   Surgical wound dehiscence    1. Surgical wound dehissance of R foot wound - 1. Empiric zosyn and vanc for the moment 2. Call Dr. Sharol Given in AM 3. Wound care consult 4. Has okay capillary refill on the foot, obvious bleeding from wound, and sensation in toes is intact but cannot assess DP pulse due to ulcer being in the  way. 2. Surgical wound dehissance of L foot wound - 1. Although healing is very slow it does look slightly better than it looked when he was admitted back a month ago 2. Continue wound care for now 3. DM2 - continue home meds for now   DVT prophylaxis: Lovenox Code Status: Full Family Communication: No family in room Consults called: None Admission status: Admit to inpatient   Etta Quill DO [facility] Pager [phone] from 7PM-7AM  If 7AM-7PM, please contact the day physician for the patient [email or web address] Password [email or web address]  02/21/2016, 3:19 AM

## 2016-02-21 NOTE — ED Notes (Signed)
Attempted report 

## 2016-02-21 NOTE — Progress Notes (Signed)
Progress Note    Shane [name]  [id number] DOB: 08-11-1970  DOA: 02/20/2016 PCP: Shane Ends, MD    Brief Narrative:    Chief complaint: Right foot pain secondary to wound dehise  Shane [name] is an 45 y.o. male with a PMH of suboptimally controlled diabetes (last hemoglobin A1c 8% on 01/01/16), peripheral vascular disease complicated by diabetic foot ulcers bilaterally and a history of left first and second toe amputation, and hypertension who was admitted 02/20/16 with right foot pain. Of note, the patient was recently admitted to the hospital for cellulitis and abscess of the right dorsal foot, status post I&D by Shane [name]. It was recommended that the patient be NWB however he has been noncompliant with this. In the ED, he was started on vancomycin and Zosyn.  Assessment/Plan:   Principal Problem:   Diabetic ulcer of right foot associated with type 2 diabetes mellitus, with necrosis of muscle (Shane [name]) and surgical wound dehiscence Continue empiric vancomycin and Zosyn. Wound care team has deferred wound care recommendations to orthopedic surgeon. Consult orthopedic surgery.Spoke with Shane [name], Shane [name] P.A. Who will see the patient in consultation. Follow-up wound cultures. Plain films personally reviewed. Soft tissue swelling evident, but no evidence of bony destruction indicative of osteomyelitis.  Active Problems:   Iron deficiency anemia Evaluated by hematologist on 02/09/16. Iron supplementation initiated. Erythropoietin level 17.4. Hemoglobin currently stable with no current indication for transfusion. Follow-up with Shane [name].    DM2 (diabetes mellitus, type 2) ([place]) with complications Hold Glucotrol and metformin. Use moderate scale SSI. Continue Neurontin for diabetic neuropathy.    Hypertension Continue lisinopril, Norvasc and Coreg.    Hyperlipidemia Continue Lipitor.   Family Communication/Anticipated D/C date and plan/Code Status   DVT prophylaxis:  Lovenox ordered. Code Status: Full Code.  Family Communication: Wife, Shane [name], updated at bedside. Disposition Plan: Anticipate another 2-3 days in the hospital depending on orthopedic surgery's recommendations.   Medical Consultants:    Orthopedic Surgery   Procedures:    None  Anti-Infectives:    Vancomycin 02/20/16 --->  Zosyn 02/20/16 --->  Subjective:   The patient reports that He continues to have pain, sharp in quality, rated 9/10 in intensity, and that increases with movement. Review of symptoms is negative for nausea, vomiting, and diarrhea. Reports that his appetite is fair and that his bowels last moved on 02/19/16.  Objective:    Vitals:   02/21/16 0345 02/21/16 0400 02/21/16 0415 02/21/16 0451  BP: 152/89 144/87 145/93 (!) 159/93  Pulse: 87 86  87  Resp:    18  Temp:    97.7 F (36.5 C)  TempSrc:    Oral  SpO2: 100% 98%  100%  Weight:      Height:        Intake/Output Summary (Last 24 hours) at 02/21/16 0737 Last data filed at 02/21/16 0650  Gross per 24 hour  Intake                0 ml  Output              300 ml  Net             -300 ml   Filed Weights   02/20/16 1802  Weight: 78.5 kg (173 lb)    Exam: General exam: Appears calm and comfortable.  Respiratory system: Clear to auscultation. Respiratory effort normal. Cardiovascular system: S1 & S2 heard, RRR. No JVD,  rubs, gallops or clicks. No murmurs.  Gastrointestinal system: Abdomen is nondistended, soft and nontender. No organomegaly or masses felt. Normal bowel sounds heard. Central nervous system: Alert and oriented. No focal neurological deficits. Extremities: No clubbing,  or cyanosis. No edema. Skin: Diabetic wounds as pictured below.  Psychiatry: Judgement and insight appear normal. Mood & affect appropriate.        Data Reviewed:   I have personally reviewed following labs and imaging studies:  Labs: Basic Metabolic Panel:  Recent Labs Lab 02/20/16 1810  NA 138  K 4.5    CL 107  CO2 25  GLUCOSE 135*  BUN 25*  CREATININE 1.11  CALCIUM 9.1   GFR Estimated Creatinine Clearance: 80.5 mL/min (by C-G formula based on SCr of 1.11 mg/dL). Liver Function Tests:  Recent Labs Lab 02/20/16 1810  AST 18  ALT 14*  ALKPHOS 57  BILITOT 0.4  PROT 6.7  ALBUMIN 3.5   CBC:  Recent Labs Lab 02/20/16 1810  WBC 10.6*  NEUTROABS 7.9*  HGB 9.1*  HCT 28.7*  MCV 94.4  PLT 278   CBG:  Recent Labs Lab 02/21/16 0650  GLUCAP 180*    Microbiology Recent Results (from the past 240 hour(s))  Wound or Superficial Culture     Status: None (Preliminary result)   Collection Time: 02/21/16  1:04 AM  Result Value Ref Range Status   Specimen Description WOUND  Final   Special Requests RIGHT DORSAL FOOT  Final   Gram Stain   Final    RARE WBC PRESENT, PREDOMINANTLY MONONUCLEAR RARE GRAM POSITIVE COCCI IN PAIRS    Culture PENDING  Incomplete   Report Status PENDING  Incomplete    Radiology: Dg Foot Complete Right  Result Date: 02/20/2016 CLINICAL DATA:  Right foot pain. EXAM: RIGHT FOOT COMPLETE - 3+ VIEW COMPARISON:  Radiographs of January 11, 2016. FINDINGS: There is no evidence of fracture or dislocation. There is no evidence of arthropathy or other focal bone abnormality. Vascular calcifications are noted. Dressing is seen overlying dorsal soft tissues. IMPRESSION: No definite osseous abnormality seen. Dorsal soft tissue swelling is noted. Electronically Signed   By: Marijo Conception, M.D.   On: 02/20/2016 19:15    Medications:   . amLODipine  10 mg Oral Daily  . aspirin EC  325 mg Oral Daily  . atorvastatin  80 mg Oral q1800  . carvedilol  12.5 mg Oral BID WC  . divalproex  1,000 mg Oral QHS  . enoxaparin (LOVENOX) injection  40 mg Subcutaneous Q24H  . ferrous sulfate  325 mg Oral Q breakfast  . FLUoxetine  10 mg Oral Daily  . gabapentin  200 mg Oral TID  . glipiZIDE  10 mg Oral QAC breakfast  . lisinopril  40 mg Oral Daily  . metFORMIN  1,000 mg  Oral BID WC  . pantoprazole  40 mg Oral Daily  . piperacillin-tazobactam (ZOSYN)  IV  3.375 g Intravenous Q8H  . saccharomyces boulardii  250 mg Oral BID  . vancomycin  1,000 mg Intravenous Q12H   Continuous Infusions:   Medical decision making is of high complexity and this patient is at high risk of deterioration,however the patient was seen after midnight therefore this is a follow-up visit with no charge.    LOS: 0 days   Rozelle Caudle  [facility] Pager [phone]. If unable to reach me by pager, please call my cell phone at [phone].  *Please refer to [email or web address], password [email or web address] to get updated schedule on who will round on this patient,  as hospitalists switch teams weekly. If 7PM-7AM, please contact night-coverage at [email or web address], password [email or web address] for any overnight needs.  02/21/2016, 7:37 AM

## 2016-02-21 NOTE — Consult Note (Addendum)
[facility] Nurse wound consult note WOC consult requested for bilat feet.  Dr Sharol Given has followed pt prior to admission, according to the EMR. Dressing procedure/placement/frequency: Dr. Rockne Menghini informed patient that Dr. Sharol Given has been called and his assistant, Junie Panning will be seeing him this am.  Please refer to ortho team for further plan of care and  re-consult if further assistance is needed.  Thank-you,  Julien Girt MSN, [place], [place], [place], [place]

## 2016-02-21 NOTE — Consult Note (Signed)
ORTHOPAEDIC CONSULTATION  REQUESTING PHYSICIAN: Venetia Maxon Rama, MD  Chief Complaint: bilateral dorsal foot ulcers  HPI: Shane [name] is a 45 y.o. male with medical history significant of diabetes, hyperlipidemia, hypertension, anemia, peripheral vascular disease, diabetic ulcers to bilateral feet, status post left first and second toe amputation.  In early July he developed cellulitis and abscess of his LEFT foot dorsum.  This was ultimately treated with I+D followed by wound dehiscence, surgical wound vac.  This done by Dr. Marlou Sa.  In late July despite ongoing slow healing of the LEFT foot wound (which has continued through today), he developed cellulitis and abscess of his RIGHT foot dorsum.  This was also treated with I+D, he was discharged on 8/8, he followed up with Dr. Sharol Given (who performed this surgery), and had stitches removed in office.  After stitches were removed the wound began to dehiss and he has been seeing wound care since that time.  Has been noncompliant with NWB. Yesterday, however, he was outside bearing weight on the foot and doing yard work (which he now admits he wasn't supposed to be doing).  He then had gradual onset of severe pain and now bleeding and drainage from the foot wound on the RIGHT foot.  Pain is severe, 10/10 worse with touch, movement, ambulation.  Sweats and subjective fever that onset last night.    Past Medical History:  Diagnosis Date  . Anemia   . Bipolar disorder ([place])   . Chest pain    a. 2015 Reportedly normal stress test in [place].  Marland Kitchen Chronic diastolic CHF (congestive heart failure) (HCC)    a.03/2015 Echo: EF 55-60%, Gr 1 DD, mild MR, triv PR.  . Depression with anxiety   . GERD (gastroesophageal reflux disease)   . Hyperlipidemia   . Hypertension    a. 08/2014 Admitted with hypertensive urgency.  . Internal carotid artery stenosis   . Lower GI bleed 07/04/2015  . Refusal of blood transfusions as patient is Jehovah's Witness   . Sleep  apnea   . TIA (transient ischemic attack) 08/2014; 03/2015   a. 08/2014 in setting of hypertensive urgency.  . Type II diabetes mellitus ([place])    started insulin spring 2016  . Vitamin D deficiency spring 2016   Past Surgical History:  Procedure Laterality Date  . AMPUTATION Left 08/03/2015   Procedure: AMPUTATION LEFT GREAT TOE;  Surgeon: Newt Minion, MD;  Location: [facility];  Service: Orthopedics;  Laterality: Left;  . AMPUTATION Left 12/02/2015   Procedure: amputation of left 2nd digit  . APPLICATION OF WOUND VAC Left 12/19/2015   Procedure: APPLICATION OF WOUND VAC;  Surgeon: Meredith Pel, MD;  Location: [facility];  Service: Orthopedics;  Laterality: Left;  . CIRCUMCISION    . COLONOSCOPY N/A 01/22/2016   Procedure: COLONOSCOPY;  Surgeon: Gatha Mayer, MD;  Location: [facility];  Service: Endoscopy;  Laterality: N/A;  . ESOPHAGOGASTRODUODENOSCOPY N/A 01/19/2016   Procedure: ESOPHAGOGASTRODUODENOSCOPY (EGD);  Surgeon: Manus Gunning, MD;  Location: [facility];  Service: Gastroenterology;  Laterality: N/A;  . ESOPHAGOGASTRODUODENOSCOPY N/A 01/22/2016   Procedure: ESOPHAGOGASTRODUODENOSCOPY (EGD);  Surgeon: Gatha Mayer, MD;  Location: [facility] ENDOSCOPY;  Service: Endoscopy;  Laterality: N/A;  . I&D EXTREMITY Left 12/02/2015   Procedure: IRRIGATION AND DEBRIDEMENT OF FOOT; LEFT SECOND TOE AMPUTATION;  Surgeon: Meredith Pel, MD;  Location: [facility];  Service: Orthopedics;  Laterality: Left;  . I&D EXTREMITY Left 12/19/2015   Procedure: I & D LEFT FOOT WITH BEADS ;  Surgeon: Meredith Pel, MD;  Location: [facility];  Service: Orthopedics;  Laterality: Left;  . I&D EXTREMITY Right 01/17/2016   Procedure: IRRIGATION AND DEBRIDEMENT RIGHT FOOT;  Surgeon: Newt Minion, MD;  Location: [facility];  Service: Orthopedics;  Laterality: Right;  . INCISION AND DRAINAGE FOOT Right 01/17/2016  . INGUINAL HERNIA REPAIR Bilateral ~ 1983- ~ 1986  . TONSILLECTOMY  ~ 62   Social History   Social History    . Marital status: Married    Spouse name: N/A  . Number of children: N/A  . Years of education: N/A   Social History Main Topics  . Smoking status: Never Smoker  . Smokeless tobacco: Never Used  . Alcohol use No  . Drug use: No  . Sexual activity: No   Other Topics Concern  . None   Social History Narrative   Lives in [place] with wife.  Active but doesn't routinely exercise.   Family History  Problem Relation Age of Onset  . Hypertension Mother   . Diabetes Mother   . Hyperlipidemia Mother   . Heart disease Mother     s/p pacemaker  . Diabetes Father   . Hypertension Father   . Stroke Father   . Heart attack Father     first MI @ 46.  . Stroke Brother    - negative except otherwise stated in the family history section Allergies  Allergen Reactions  . Other Other (See Comments)    Red meat causes stomach pains, bloating and diarrhea  . Milk-Related Compounds Diarrhea and Other (See Comments)    Any dairy products  - diarrhea and bloating   Prior to Admission medications   Medication Sig Start Date End Date Taking? Authorizing Provider  amLODipine (NORVASC) 10 MG tablet Take 1 tablet (10 mg total) by mouth daily. 01/23/16  Yes Hosie Poisson, MD  aspirin EC 325 MG tablet Take 1 tablet (325 mg total) by mouth daily. 09/04/15  Yes Maren Reamer, MD  atorvastatin (LIPITOR) 80 MG tablet Take 1 tablet (80 mg total) by mouth every morning. 08/18/15  Yes Josalyn Funches, MD  carvedilol (COREG) 12.5 MG tablet TAKE 1 TABLET BY MOUTH 2 TIMES DAILY WITH A MEAL 01/30/16  Yes Josalyn Funches, MD  divalproex (DEPAKOTE) 500 MG DR tablet Take 2 tablets (1,000 mg total) by mouth at bedtime. 12/04/15  Yes Venetia Maxon Rama, MD  ferrous sulfate 325 (65 FE) MG tablet Take 1 tablet (325 mg total) by mouth 2 (two) times daily with a meal. Patient taking differently: Take 325 mg by mouth daily with breakfast.  08/18/15  Yes Josalyn Funches, MD  FLUoxetine (PROZAC) 10 MG tablet Take 1 tablet (10 mg  total) by mouth every morning. 01/02/16  Yes Oswald Hillock, MD  gabapentin (NEURONTIN) 100 MG capsule Take 2 capsules (200 mg total) by mouth 3 (three) times daily. 01/23/16  Yes Hosie Poisson, MD  glipiZIDE (GLUCOTROL) 10 MG tablet Take 1 tablet (10 mg total) by mouth 2 (two) times daily before a meal. Patient taking differently: Take 10 mg by mouth daily before breakfast.  08/18/15  Yes Josalyn Funches, MD  glucose blood (TRUE METRIX BLOOD GLUCOSE TEST) test strip Use as instructed 12/29/15  Yes Josalyn Funches, MD  HYDROcodone-acetaminophen (NORCO/VICODIN) 5-325 MG tablet Take 1 tablet by mouth every 6 (six) hours as needed for moderate pain. 01/23/16  Yes Hosie Poisson, MD  ibuprofen (ADVIL,MOTRIN) 800 MG tablet TAKE 1 TABLET BY MOUTH EVERY 8 HOURS AS  NEEDED. Patient taking differently: TAKE 1 TABLET BY MOUTH EVERY 8 HOURS AS NEEDED FOR PAIN 02/12/16  Yes Josalyn Funches, MD  lisinopril (PRINIVIL,ZESTRIL) 40 MG tablet Take 1 tablet (40 mg total) by mouth daily. 08/18/15  Yes Boykin Nearing, MD  metFORMIN (GLUCOPHAGE) 1000 MG tablet Take 1 tablet (1,000 mg total) by mouth 2 (two) times daily with a meal. 12/22/15  Yes Ripudeep K Rai, MD  omeprazole (PRILOSEC) 20 MG capsule Take 1 capsule (20 mg total) by mouth daily. 08/18/15  Yes Josalyn Funches, MD  saccharomyces boulardii (FLORASTOR) 250 MG capsule Take 1 capsule (250 mg total) by mouth 2 (two) times daily. 01/15/16  Yes Janece Canterbury, MD  traZODone (DESYREL) 50 MG tablet Take 0.5-1 tablets (25-50 mg total) by mouth at bedtime as needed for sleep. 11/21/15  Yes Boykin Nearing, MD  TRUEPLUS LANCETS 28G MISC Use as directed 12/29/15  Yes Boykin Nearing, MD   Dg Foot Complete Right  Result Date: 02/20/2016 CLINICAL DATA:  Right foot pain. EXAM: RIGHT FOOT COMPLETE - 3+ VIEW COMPARISON:  Radiographs of January 11, 2016. FINDINGS: There is no evidence of fracture or dislocation. There is no evidence of arthropathy or other focal bone abnormality. Vascular  calcifications are noted. Dressing is seen overlying dorsal soft tissues. IMPRESSION: No definite osseous abnormality seen. Dorsal soft tissue swelling is noted. Electronically Signed   By: Marijo Conception, M.D.   On: 02/20/2016 19:15   - pertinent xrays, CT, MRI studies were reviewed and independently interpreted  Positive ROS: All other systems have been reviewed and were otherwise negative with the exception of those mentioned in the HPI and as above.  Physical Exam: General: Alert, no acute distress Cardiovascular: No pedal edema Respiratory: No cyanosis, no use of accessory musculature GI: No organomegaly, abdomen is soft and non-tender Skin: No lesions in the area of chief complaint Neurologic: Sensation intact distally Psychiatric: Patient is competent for consent with normal mood and affect Lymphatic: No axillary or cervical lymphadenopathy  MUSCULOSKELETAL:  Left foot: Dorsal ulcer that is 2 cm in diameter. 90% beefy granulation tissue. No drainage, surrounding erythema, swelling or pain. No sign of infection. PT 2+. Right foot: Moderate swelling and erythema to foot. Dorsal ulcer that is approximately 5 cm x 3 cm. There is tendon exposure. There is 75% fibrinous exudative tissue. No purulence. No ascending cellulitis. PT 2+.  Assessment: Cellulitis to right foot. Wagner grade 3 ulcer dorsum, right foot.  Wagner grade 1 ulcer, left dorsal foot.  Plan: Daily wound cleansing with dial soap to bilateral dorsal foot wounds. Will wear the Vive compression sock on left lower extremity. Directly over the wound. Silvadene dressings to the right foot ulcer.  Transition to oral antibiotics.  Will see him back in the office in one week.  Thank you for the consult and the opportunity to see Shane [name], [place] [phone] 2:55 PM

## 2016-02-21 NOTE — Progress Notes (Signed)
Pharmacy Antibiotic Note  Chales Umemoto is a 45 y.o. male admitted on 02/20/2016 with R foot wound infection.  Pharmacy has been consulted for Vancomycin and Zosyn dosing.  Pt received Vanc 1500mg  and Zosyn 3.375gm in ED ~0100.  Plan: Zosyn 3.375gm IV q8h - doses over 4 hours Vancomycin 1gm IV q12h Will f/u micro data, renal function, and pt's clinical condition Vanc trough prn  Height: 5\' 8"  (172.7 cm) Weight: 173 lb (78.5 kg) IBW/kg (Calculated) : 68.4  Temp (24hrs), Avg:97.9 F (36.6 C), Min:97.7 F (36.5 C), Max:98 F (36.7 C)   Recent Labs Lab 02/20/16 1810  WBC 10.6*  CREATININE 1.11    Estimated Creatinine Clearance: 80.5 mL/min (by C-G formula based on SCr of 1.11 mg/dL).    Allergies  Allergen Reactions  . Other Other (See Comments)    Red meat causes stomach pains, bloating and diarrhea  . Milk-Related Compounds Diarrhea and Other (See Comments)    Any dairy products  - diarrhea and bloating    Antimicrobials this admission: 9/6 Vanc >>  9/6 Zosyn >>   Dose adjustments this admission: n/a  Microbiology results: 9/6 Wound cx:  Thank you for allowing pharmacy to be a part of this patient's care.  Sherlon Handing, PharmD, BCPS Clinical pharmacist, pager [phone] 02/21/2016 3:11 AM

## 2016-02-22 DIAGNOSIS — F418 Other specified anxiety disorders: Secondary | ICD-10-CM

## 2016-02-22 DIAGNOSIS — E1159 Type 2 diabetes mellitus with other circulatory complications: Secondary | ICD-10-CM

## 2016-02-22 DIAGNOSIS — D509 Iron deficiency anemia, unspecified: Secondary | ICD-10-CM

## 2016-02-22 LAB — GLUCOSE, CAPILLARY
GLUCOSE-CAPILLARY: 135 mg/dL — AB (ref 65–99)
Glucose-Capillary: 152 mg/dL — ABNORMAL HIGH (ref 65–99)
Glucose-Capillary: 189 mg/dL — ABNORMAL HIGH (ref 65–99)
Glucose-Capillary: 118 mg/dL — ABNORMAL HIGH (ref 65–99)

## 2016-02-22 MED ORDER — INSULIN ASPART 100 UNIT/ML ~~LOC~~ SOLN
4.0000 [IU] | Freq: Three times a day (TID) | SUBCUTANEOUS | Status: DC
  Administered 2016-02-22 – 2016-02-23 (×4): 4 [IU] via SUBCUTANEOUS

## 2016-02-22 MED ORDER — INSULIN ASPART 100 UNIT/ML ~~LOC~~ SOLN
0.0000 [IU] | Freq: Three times a day (TID) | SUBCUTANEOUS | Status: DC
  Administered 2016-02-22 – 2016-02-23 (×3): 3 [IU] via SUBCUTANEOUS

## 2016-02-22 MED ORDER — INSULIN ASPART 100 UNIT/ML ~~LOC~~ SOLN: 0.0000 [IU] | Freq: Every day | SUBCUTANEOUS | Status: DC

## 2016-02-22 MED ORDER — DIPHENHYDRAMINE HCL 25 MG PO CAPS
25.0000 mg | ORAL_CAPSULE | ORAL | Status: DC | PRN
  Administered 2016-02-22 (×2): 25 mg via ORAL
  Filled 2016-02-22 (×2): qty 1

## 2016-02-22 MED ORDER — PNEUMOCOCCAL VAC POLYVALENT 25 MCG/0.5ML IJ INJ
0.5000 mL | INJECTION | INTRAMUSCULAR | Status: AC
  Administered 2016-02-23: 0.5 mL via INTRAMUSCULAR
  Filled 2016-02-22: qty 0.5

## 2016-02-22 NOTE — Plan of Care (Signed)
Problem: Pain Managment: Goal: General experience of comfort will improve Outcome: Progressing Medicated twice for pain this shift  Problem: Activity: Goal: Risk for activity intolerance will decrease Outcome: Progressing Resting in the bed this shift  Problem: Bowel/Gastric: Goal: Will not experience complications related to bowel motility Outcome: Progressing No bowel issues noted

## 2016-02-22 NOTE — Progress Notes (Signed)
Progress Note    Shane [name]  [id number] DOB: 1970-11-08  DOA: 02/20/2016 PCP: Minerva Ends, MD    Brief Narrative:    Chief complaint: Right foot pain secondary to wound dehise  Ki Akerman is an 45 y.o. male with a PMH of suboptimally controlled diabetes (last hemoglobin A1c 8% on 01/01/16), peripheral vascular disease complicated by diabetic foot ulcers bilaterally and a history of left first and second toe amputation, and hypertension who was admitted 02/20/16 with right foot pain. Of note, the patient was recently admitted to the hospital for cellulitis and abscess of the right dorsal foot, status post I&D by Dr. Sharol Given. It was recommended that the patient be NWB however he has been noncompliant with this. In the ED, he was started on vancomycin and Zosyn.  Assessment/Plan:   Principal Problem:   Diabetic ulcer of right foot associated with type 2 diabetes mellitus, with necrosis of muscle ([place]) and surgical wound dehiscence Continue empiric vancomycin and Zosyn.Plain films personally reviewed. Soft tissue swelling evident, but no evidence of bony destruction indicative of osteomyelitis. Evaluated by orthopedic surgery P.A. with recommendations for wound care and transition to oral antibiotics, however wound still looks infected and there is erythema along the right medial forefoot/right great toe, so I will keep on IV antibiotics for now while awaiting culture results. Check renal function in the morning given risk of Vancomycin renal toxicity. Continue wound care as outlined by ortho.  Active Problems:   Iron deficiency anemia Evaluated by hematologist on 02/09/16. Iron supplementation initiated. Erythropoietin level 17.4. Hemoglobin currently stable with no current indication for transfusion. Follow-up with Dr. Lindi Adie.    DM2 (diabetes mellitus, type 2) ([place]) with complications Hold Glucotrol and metformin. Currently managing with moderate scale SSI. CBGs 96-246. Add 4  units meal coverage Q AC. Continue Neurontin for diabetic neuropathy.    Hypertension Continue lisinopril, Norvasc and Coreg.    Hyperlipidemia Continue Lipitor.   Family Communication/Anticipated D/C date and plan/Code Status   DVT prophylaxis: Lovenox ordered. Code Status: Full Code.  Family Communication: Wife, [name], updated at bedside 02/21/16. Disposition Plan: Anticipate another 1-2 days in the hospital due to ongoing need for IV antibiotics.   Medical Consultants:    Orthopedic Surgery   Procedures:    None  Anti-Infectives:    Vancomycin 02/20/16 --->  Zosyn 02/20/16 --->  Subjective:   The patient reports that He continues to have pain, sharp in quality, rated 10+/10 in intensity, and that increases with movement. Pain meds bring pain down to a level 6/10. Review of symptoms is negative for fever/chills, nausea, vomiting, and diarrhea. Reports that his appetite is good and that his bowels last moved on 02/21/16.  Objective:    Vitals:   02/21/16 0451 02/21/16 1548 02/21/16 2025 02/22/16 0500  BP: (!) 159/93 123/77 129/84 124/75  Pulse: 87 82 86 90  Resp: 18 18 20 20   Temp: 97.7 F (36.5 C) 97.9 F (36.6 C) 97.3 F (36.3 C) 98.5 F (36.9 C)  TempSrc: Oral Oral Oral Oral  SpO2: 100% 98% 100% 93%  Weight:      Height:        Intake/Output Summary (Last 24 hours) at 02/22/16 0819 Last data filed at 02/22/16 0635  Gross per 24 hour  Intake             1870 ml  Output             3650 ml  Net            -  1780 ml   Filed Weights   02/20/16 1802  Weight: 78.5 kg (173 lb)    Exam: General exam: Appears calm and comfortable.  Respiratory system: Clear to auscultation. Respiratory effort normal. Cardiovascular system: S1 & S2 heard, RRR. No JVD,  rubs, gallops or clicks. No murmurs. Gastrointestinal system: Abdomen is nondistended, soft and nontender. No organomegaly or masses felt. Normal bowel sounds heard. Central nervous system: Alert and  oriented. No focal neurological deficits. Extremities: BLE wounds, right wound pictured below. Skin: Diabetic wound RLE as pictured below.  Psychiatry: Judgement and insight appear normal. Mood & affect appropriate.      Data Reviewed:   I have personally reviewed following labs and imaging studies:  Labs: Basic Metabolic Panel:  Recent Labs Lab 02/20/16 1810  NA 138  K 4.5  CL 107  CO2 25  GLUCOSE 135*  BUN 25*  CREATININE 1.11  CALCIUM 9.1   GFR Estimated Creatinine Clearance: 80.5 mL/min (by C-G formula based on SCr of 1.11 mg/dL). Liver Function Tests:  Recent Labs Lab 02/20/16 1810  AST 18  ALT 14*  ALKPHOS 57  BILITOT 0.4  PROT 6.7  ALBUMIN 3.5   CBC:  Recent Labs Lab 02/20/16 1810  WBC 10.6*  NEUTROABS 7.9*  HGB 9.1*  HCT 28.7*  MCV 94.4  PLT 278   CBG:  Recent Labs Lab 02/21/16 0650 02/21/16 1124 02/21/16 1626 02/21/16 2127 02/22/16 0705  GLUCAP 180* 212* 96 246* 152*    Microbiology Recent Results (from the past 240 hour(s))  Wound or Superficial Culture     Status: None (Preliminary result)   Collection Time: 02/21/16  1:04 AM  Result Value Ref Range Status   Specimen Description WOUND  Final   Special Requests RIGHT DORSAL FOOT  Final   Gram Stain   Final    RARE WBC PRESENT, PREDOMINANTLY MONONUCLEAR RARE GRAM POSITIVE COCCI IN PAIRS    Culture PENDING  Incomplete   Report Status PENDING  Incomplete    Radiology: Dg Foot Complete Right  Result Date: 02/20/2016 CLINICAL DATA:  Right foot pain. EXAM: RIGHT FOOT COMPLETE - 3+ VIEW COMPARISON:  Radiographs of January 11, 2016. FINDINGS: There is no evidence of fracture or dislocation. There is no evidence of arthropathy or other focal bone abnormality. Vascular calcifications are noted. Dressing is seen overlying dorsal soft tissues. IMPRESSION: No definite osseous abnormality seen. Dorsal soft tissue swelling is noted. Electronically Signed   By: Marijo Conception, M.D.   On:  02/20/2016 19:15    Medications:   . amLODipine  10 mg Oral Daily  . aspirin EC  325 mg Oral Daily  . atorvastatin  80 mg Oral q1800  . carvedilol  12.5 mg Oral BID WC  . divalproex  1,000 mg Oral QHS  . enoxaparin (LOVENOX) injection  40 mg Subcutaneous Q24H  . ferrous sulfate  325 mg Oral Q breakfast  . FLUoxetine  10 mg Oral Daily  . gabapentin  200 mg Oral TID  . insulin aspart  0-15 Units Subcutaneous TID WC  . insulin aspart  0-5 Units Subcutaneous QHS  . lisinopril  40 mg Oral Daily  . pantoprazole  40 mg Oral Daily  . piperacillin-tazobactam (ZOSYN)  IV  3.375 g Intravenous Q8H  . saccharomyces boulardii  250 mg Oral BID  . silver sulfADIAZINE   Topical Daily  . vancomycin  1,000 mg Intravenous Q12H   Continuous Infusions:   Medical decision making is of high complexity and  this patient is at high risk of deterioration, with no change in presenting problem, uncontrolled DM requiring insulin adjustment and stable chronic problems.    LOS: 1 day   Elesia Pemberton  [facility] Pager [phone]. If unable to reach me by pager, please call my cell phone at [phone].  *Please refer to [email or web address], password [email or web address] to get updated schedule on who will round on this patient, as hospitalists switch teams weekly. If 7PM-7AM, please contact night-coverage at [email or web address], password [email or web address] for any overnight needs.  02/22/2016, 8:19 AM

## 2016-02-23 DIAGNOSIS — T8131XD Disruption of external operation (surgical) wound, not elsewhere classified, subsequent encounter: Secondary | ICD-10-CM

## 2016-02-23 DIAGNOSIS — I10 Essential (primary) hypertension: Secondary | ICD-10-CM

## 2016-02-23 DIAGNOSIS — E785 Hyperlipidemia, unspecified: Secondary | ICD-10-CM

## 2016-02-23 LAB — GLUCOSE, CAPILLARY
GLUCOSE-CAPILLARY: 173 mg/dL — AB (ref 65–99)
Glucose-Capillary: 165 mg/dL — ABNORMAL HIGH (ref 65–99)
Glucose-Capillary: 160 mg/dL — ABNORMAL HIGH (ref 65–99)

## 2016-02-23 LAB — AEROBIC CULTURE W GRAM STAIN (SUPERFICIAL SPECIMEN)

## 2016-02-23 LAB — AEROBIC CULTURE  (SUPERFICIAL SPECIMEN)

## 2016-02-23 LAB — BASIC METABOLIC PANEL
ANION GAP: 8 (ref 5–15)
BUN: 18 mg/dL (ref 6–20)
CALCIUM: 8.3 mg/dL — AB (ref 8.9–10.3)
CO2: 29 mmol/L (ref 22–32)
CREATININE: 1.22 mg/dL (ref 0.61–1.24)
Chloride: 102 mmol/L (ref 101–111)
Glucose, Bld: 186 mg/dL — ABNORMAL HIGH (ref 65–99)
Potassium: 4.2 mmol/L (ref 3.5–5.1)
SODIUM: 139 mmol/L (ref 135–145)

## 2016-02-23 MED ORDER — SILVER SULFADIAZINE 1 % EX CREA: TOPICAL_CREAM | Freq: Every day | CUTANEOUS | 0 refills | Status: DC

## 2016-02-23 MED ORDER — DOXYCYCLINE HYCLATE 100 MG PO TABS
100.0000 mg | ORAL_TABLET | Freq: Two times a day (BID) | ORAL | 0 refills | Status: DC
Start: 2016-02-23 — End: 2016-04-26

## 2016-02-23 MED ORDER — DOXYCYCLINE HYCLATE 100 MG PO TABS
100.0000 mg | ORAL_TABLET | Freq: Two times a day (BID) | ORAL | Status: DC
  Administered 2016-02-23: 100 mg via ORAL
  Filled 2016-02-23: qty 1

## 2016-02-23 NOTE — Discharge Instructions (Signed)

## 2016-02-23 NOTE — Progress Notes (Signed)
This patient is no longer active with [facility]. Unable to reach Case Manager to inform her at this time.  Edwinna Areola [phone]

## 2016-02-23 NOTE — Discharge Summary (Signed)
Physician Discharge Summary  Shane [name] [id number] DOB: 1970/07/23 DOA: 02/20/2016  PCP: Minerva Ends, MD  Admit date: 02/20/2016 Discharge date: 02/23/2016  Admitted From: Home Discharge disposition: Home   Recommendations for Outpatient Follow-Up:   1. Home health nurse set up. The patient has scheduled follow-up with Dr. Sharol Given.   Discharge Diagnosis:   Principal Problem:    Diabetic ulcer of right foot associated with type 2 diabetes mellitus, with necrosis of muscle ([place]) Active Problems:    HLD (hyperlipidemia)    Essential hypertension    Depression with anxiety    Type 2 diabetes mellitus with vascular disease (HCC)    Anemia, iron deficiency    Surgical wound dehiscence  Discharge Condition: Improved.  Diet recommendation: Low sodium, heart healthy.    Wound care: Cleanse with Dial soap, apply Silvadene cream to wound and cover with dry dressing daily.   History of Present Illness:   Shane [name] is an 45 y.o. male with a PMH of suboptimally controlled diabetes (last hemoglobin A1c 8% on 01/01/16), peripheral vascular disease complicated by diabetic foot ulcers bilaterally and a history of left first and second toe amputation, and hypertension who was admitted 02/20/16 with right foot pain. Of note, the patient was recently admitted to the hospital for cellulitis and abscess of the right dorsal foot, status post I&D by Dr. Sharol Given. It was recommended that the patient be NWB however he has been noncompliant with this. In the ED, he was started on vancomycin and Zosyn.  Hospital Course by Problem:   Principal Problem:   Diabetic ulcer of right foot associated with type 2 diabetes mellitus, with necrosis of muscle ([place]) and surgical wound dehiscence Soft tissue swelling evident on plain films, but no evidence of bony destruction indicative of osteomyelitis. Initially treated with IV vancomycin and Zosyn. Evaluated by orthopedic surgery P.A. with  recommendations for wound care and transition to oral antibiotics. Wound cultures growing MSSA, doxycycline sensitive. We'll discharge home on 1 week of therapy with doxycycline.  Active Problems:   Iron deficiency anemia Evaluated by hematologist on 02/09/16. Iron supplementation initiated. Erythropoietin level 17.4. Hemoglobin currently stable with no current indication for transfusion. Follow-up with Dr. Lindi Adie.    DM2 (diabetes mellitus, type 2) ([place]) with complications Hold Glucotrol and metformin. Currently managing with moderate scale SSI and 4 units of meal coverage. CBGs 118-189.  Continue Neurontin for diabetic neuropathy. Resume home regimen at discharge.    Hypertension Continue lisinopril, Norvasc and Coreg.    Hyperlipidemia Continue Lipitor.   Medical Consultants:    Orthopedic Surgery   Discharge Exam:   Vitals:   02/23/16 0455 02/23/16 1355  BP: (!) 146/88 (!) 156/95  Pulse: 88 88  Resp: 18 19  Temp: 98.3 F (36.8 C) 97.7 F (36.5 C)   Vitals:   02/22/16 1500 02/22/16 2050 02/23/16 0455 02/23/16 1355  BP: 116/79 139/80 (!) 146/88 (!) 156/95  Pulse: 89 94 88 88  Resp: 19 18 18 19   Temp: 98.9 F (37.2 C) 98.7 F (37.1 C) 98.3 F (36.8 C) 97.7 F (36.5 C)  TempSrc: Oral Oral Oral Oral  SpO2: 98% 92% 94% 94%  Weight:      Height:       General exam: Appears calm and comfortable.  Respiratory system: Clear to auscultation. Respiratory effort normal. Cardiovascular system: S1 & S2 heard, RRR. No JVD,  rubs, gallops or clicks. No murmurs. Gastrointestinal system: Abdomen is nondistended, soft and nontender. No organomegaly or masses felt.  Normal bowel sounds heard. Central nervous system: Alert and oriented. No focal neurological deficits. Extremities: BLE wounds, right wound pictured below. Skin: Diabetic wound RLE as pictured below.  Psychiatry: Judgement and insight appear normal. Mood & affect appropriate.      The results of significant  diagnostics from this hospitalization (including imaging, microbiology, ancillary and laboratory) are listed below for reference.     Procedures and Diagnostic Studies:   Dg Foot Complete Right  Result Date: 02/20/2016 CLINICAL DATA:  Right foot pain. EXAM: RIGHT FOOT COMPLETE - 3+ VIEW COMPARISON:  Radiographs of January 11, 2016. FINDINGS: There is no evidence of fracture or dislocation. There is no evidence of arthropathy or other focal bone abnormality. Vascular calcifications are noted. Dressing is seen overlying dorsal soft tissues. IMPRESSION: No definite osseous abnormality seen. Dorsal soft tissue swelling is noted. Electronically Signed   By: Marijo Conception, M.D.   On: 02/20/2016 19:15     Labs:   Basic Metabolic Panel:  Recent Labs Lab 02/20/16 1810 02/23/16 0444  NA 138 139  K 4.5 4.2  CL 107 102  CO2 25 29  GLUCOSE 135* 186*  BUN 25* 18  CREATININE 1.11 1.22  CALCIUM 9.1 8.3*   GFR Estimated Creatinine Clearance: 73.2 mL/min (by C-G formula based on SCr of 1.22 mg/dL). Liver Function Tests:  Recent Labs Lab 02/20/16 1810  AST 18  ALT 14*  ALKPHOS 57  BILITOT 0.4  PROT 6.7  ALBUMIN 3.5   CBC:  Recent Labs Lab 02/20/16 1810  WBC 10.6*  NEUTROABS 7.9*  HGB 9.1*  HCT 28.7*  MCV 94.4  PLT 278   CBG:  Recent Labs Lab 02/22/16 1123 02/22/16 1702 02/22/16 2134 02/23/16 0628 02/23/16 1140  GLUCAP 189* 118* 135* 165* 173*   Microbiology Recent Results (from the past 240 hour(s))  Wound or Superficial Culture     Status: None   Collection Time: 02/21/16  1:04 AM  Result Value Ref Range Status   Specimen Description WOUND  Final   Special Requests RIGHT DORSAL FOOT  Final   Gram Stain   Final    RARE WBC PRESENT, PREDOMINANTLY MONONUCLEAR RARE GRAM POSITIVE COCCI IN PAIRS    Culture   Final    ABUNDANT STAPHYLOCOCCUS AUREUS MODERATE STREPTOCOCCUS AGALACTIAE TESTING AGAINST S. AGALACTIAE NOT ROUTINELY PERFORMED DUE TO PREDICTABILITY OF  AMP/PEN/VAN SUSCEPTIBILITY. MULTIPLE ORGANISMS PRESENT, NONE PREDOMINANT    Report Status 02/23/2016 FINAL  Final   Organism ID, Bacteria STAPHYLOCOCCUS AUREUS  Final      Susceptibility   Staphylococcus aureus - MIC*    CIPROFLOXACIN <=0.5 SENSITIVE Sensitive     ERYTHROMYCIN <=0.25 SENSITIVE Sensitive     GENTAMICIN <=0.5 SENSITIVE Sensitive     OXACILLIN 0.5 SENSITIVE Sensitive     TETRACYCLINE <=1 SENSITIVE Sensitive     VANCOMYCIN <=0.5 SENSITIVE Sensitive     TRIMETH/SULFA <=10 SENSITIVE Sensitive     CLINDAMYCIN <=0.25 SENSITIVE Sensitive     RIFAMPIN <=0.5 SENSITIVE Sensitive     Inducible Clindamycin NEGATIVE Sensitive     * ABUNDANT STAPHYLOCOCCUS AUREUS     Discharge Instructions:   Discharge Instructions    Call MD for:  severe uncontrolled pain    Complete by:  As directed   Call MD for:  temperature >100.4    Complete by:  As directed   Diet - low sodium heart healthy    Complete by:  As directed   Discharge wound care:    Complete by:  As directed   Wash with dial soap, apply silvadene daily and keep wound dressed with clean and dry gauze.   Face-to-face encounter (required for Medicare/Medicaid patients)    Complete by:  As directed   I Alliya Marcon certify that this patient is under my care and that I, or a nurse practitioner or physician's assistant working with me, had a face-to-face encounter that meets the physician face-to-face encounter requirements with this patient on 02/23/2016. The encounter with the patient was in whole, or in part for the following medical condition(s) which is the primary reason for home health care (List medical condition):  Foot wounds, needs skilled RN to assess, teach how to care for wound.  Wash with dial soap daily, apply silvadene and a dry dressing.   The encounter with the patient was in whole, or in part, for the following medical condition, which is the primary reason for home health care:  staph wound infection foot   I  certify that, based on my findings, the following services are medically necessary home health services:  Nursing   Reason for Medically Necessary Home Health Services:  Skilled Nursing- Assessment and Observation of Wound Status   My clinical findings support the need for the above services:  Unable to leave home safely without assistance and/or assistive device   Further, I certify that my clinical findings support that this patient is homebound due to:  Unable to leave home safely without assistance   Home Health    Complete by:  As directed   To provide the following care/treatments:  RN   Increase activity slowly    Complete by:  As directed   No weight bearing on right foot.       Medication List    TAKE these medications   amLODipine 10 MG tablet Commonly known as:  NORVASC Take 1 tablet (10 mg total) by mouth daily.   aspirin EC 325 MG tablet Take 1 tablet (325 mg total) by mouth daily.   atorvastatin 80 MG tablet Commonly known as:  LIPITOR Take 1 tablet (80 mg total) by mouth every morning.   carvedilol 12.5 MG tablet Commonly known as:  COREG TAKE 1 TABLET BY MOUTH 2 TIMES DAILY WITH A MEAL   divalproex 500 MG DR tablet Commonly known as:  DEPAKOTE Take 2 tablets (1,000 mg total) by mouth at bedtime.   doxycycline 100 MG tablet Commonly known as:  VIBRA-TABS Take 1 tablet (100 mg total) by mouth 2 (two) times daily.   ferrous sulfate 325 (65 FE) MG tablet Take 1 tablet (325 mg total) by mouth 2 (two) times daily with a meal. What changed:  when to take this   FLUoxetine 10 MG tablet Commonly known as:  PROZAC Take 1 tablet (10 mg total) by mouth every morning.   gabapentin 100 MG capsule Commonly known as:  NEURONTIN Take 2 capsules (200 mg total) by mouth 3 (three) times daily.   glipiZIDE 10 MG tablet Commonly known as:  GLUCOTROL Take 1 tablet (10 mg total) by mouth 2 (two) times daily before a meal. What changed:  when to take this   glucose blood  test strip Commonly known as:  TRUE METRIX BLOOD GLUCOSE TEST Use as instructed   HYDROcodone-acetaminophen 5-325 MG tablet Commonly known as:  NORCO/VICODIN Take 1 tablet by mouth every 6 (six) hours as needed for moderate pain.   ibuprofen 800 MG tablet Commonly known as:  ADVIL,MOTRIN TAKE 1 TABLET BY MOUTH EVERY 8  HOURS AS NEEDED. What changed:  See the new instructions.   lisinopril 40 MG tablet Commonly known as:  PRINIVIL,ZESTRIL Take 1 tablet (40 mg total) by mouth daily.   metFORMIN 1000 MG tablet Commonly known as:  GLUCOPHAGE Take 1 tablet (1,000 mg total) by mouth 2 (two) times daily with a meal.   omeprazole 20 MG capsule Commonly known as:  PRILOSEC Take 1 capsule (20 mg total) by mouth daily.   saccharomyces boulardii 250 MG capsule Commonly known as:  FLORASTOR Take 1 capsule (250 mg total) by mouth 2 (two) times daily.   silver sulfADIAZINE 1 % cream Commonly known as:  SILVADENE Apply topically daily.   traZODone 50 MG tablet Commonly known as:  DESYREL Take 0.5-1 tablets (25-50 mg total) by mouth at bedtime as needed for sleep.   TRUEPLUS LANCETS 28G Misc Use as directed      Follow-up Information    Newt Minion, MD Follow up on 02/29/2016.   Specialty:  Orthopedic Surgery Why:  At your scheduled appt time. Contact information: [place] [address] [place], [place] .   Specialty:  Orthopedic Surgery Contact information: [place] [address] [phone]            Time coordinating discharge: Greater than 35 minutes.  Signed:  Scheryl Sanborn  Pager [phone] [facility] 02/23/2016, 2:51 PM

## 2016-02-23 NOTE — Care Management Note (Signed)
Case Management Note  Patient Details  Name: Shane [name] MRN: [id number] Date of Birth: 23-Apr-1971  Subjective/Objective:  33 yr gentleman admitted with right foot wound deheiscence.                  Action/Plan: Case manager spoke with concerning home health needs. He is currently active with [facility], No changes. Case manager called to notiffy [place] of patient's discharge.   Expected Discharge Date:    02/23/16              Expected Discharge Plan:  [place]  In-House Referral:     Discharge planning Services  CM Consult  Post Acute Care Choice:  Durable Medical Equipment, Home Health, Resumption of Svcs/PTA Provider Choice offered to:  Patient  DME Arranged:  [facility] DME Agency:  [place] Arranged:  RN, Disease Management [place] Agency:  [place]  Status of Service:  Completed, signed off  If discussed at [place] of Stay Meetings, dates discussed:    Additional Comments:  Ninfa Meeker, RN 02/23/2016, 4:11 PM

## 2016-02-23 NOTE — Plan of Care (Signed)
Problem: Physical Regulation: Goal: Will remain free from infection Outcome: Progressing No s/s of infection noted, patient is on IV antibiotics  Problem: Skin Integrity: Goal: Risk for impaired skin integrity will decrease Outcome: Not Progressing Ulceration to right foot  Problem: Nutrition: Goal: Adequate nutrition will be maintained Outcome: Progressing Good appetite  Problem: Bowel/Gastric: Goal: Will not experience complications related to bowel motility Outcome: Progressing No bowel issues reported

## 2016-02-23 NOTE — Progress Notes (Signed)
Discharge teaching completed this time. Patient is receptive to discharge teaching and express understanding of teaching. Patient will be going home with home health

## 2016-02-26 ENCOUNTER — Encounter: Payer: Self-pay | Admitting: Family Medicine

## 2016-02-26 ENCOUNTER — Other Ambulatory Visit: Payer: Self-pay

## 2016-02-26 ENCOUNTER — Ambulatory Visit: Payer: Medicaid Other | Attending: Family Medicine | Admitting: Family Medicine

## 2016-02-26 VITALS — BP 129/80 | HR 73 | Temp 97.8°F | Resp 18 | Ht 68.0 in | Wt 187.4 lb

## 2016-02-26 DIAGNOSIS — IMO0002 Reserved for concepts with insufficient information to code with codable children: Secondary | ICD-10-CM

## 2016-02-26 DIAGNOSIS — K219 Gastro-esophageal reflux disease without esophagitis: Secondary | ICD-10-CM

## 2016-02-26 DIAGNOSIS — Z7984 Long term (current) use of oral hypoglycemic drugs: Secondary | ICD-10-CM | POA: Insufficient documentation

## 2016-02-26 DIAGNOSIS — R0602 Shortness of breath: Secondary | ICD-10-CM | POA: Diagnosis not present

## 2016-02-26 DIAGNOSIS — L97513 Non-pressure chronic ulcer of other part of right foot with necrosis of muscle: Secondary | ICD-10-CM

## 2016-02-26 DIAGNOSIS — Z79899 Other long term (current) drug therapy: Secondary | ICD-10-CM | POA: Diagnosis not present

## 2016-02-26 DIAGNOSIS — E11621 Type 2 diabetes mellitus with foot ulcer: Secondary | ICD-10-CM | POA: Diagnosis not present

## 2016-02-26 DIAGNOSIS — G4700 Insomnia, unspecified: Secondary | ICD-10-CM

## 2016-02-26 DIAGNOSIS — Z7982 Long term (current) use of aspirin: Secondary | ICD-10-CM | POA: Insufficient documentation

## 2016-02-26 DIAGNOSIS — E118 Type 2 diabetes mellitus with unspecified complications: Secondary | ICD-10-CM | POA: Diagnosis not present

## 2016-02-26 DIAGNOSIS — Z89422 Acquired absence of other left toe(s): Secondary | ICD-10-CM

## 2016-02-26 DIAGNOSIS — R42 Dizziness and giddiness: Secondary | ICD-10-CM

## 2016-02-26 DIAGNOSIS — D509 Iron deficiency anemia, unspecified: Secondary | ICD-10-CM | POA: Diagnosis not present

## 2016-02-26 DIAGNOSIS — S98132A Complete traumatic amputation of one left lesser toe, initial encounter: Secondary | ICD-10-CM

## 2016-02-26 DIAGNOSIS — E1165 Type 2 diabetes mellitus with hyperglycemia: Secondary | ICD-10-CM

## 2016-02-26 DIAGNOSIS — R079 Chest pain, unspecified: Secondary | ICD-10-CM | POA: Insufficient documentation

## 2016-02-26 LAB — CBC
HCT: 31.3 % — ABNORMAL LOW (ref 38.5–50.0)
HEMOGLOBIN: 10 g/dL — AB (ref 13.2–17.1)
MCH: 29.7 pg (ref 27.0–33.0)
MCHC: 31.9 g/dL — ABNORMAL LOW (ref 32.0–36.0)
MCV: 92.9 fL (ref 80.0–100.0)
MPV: 12.9 fL — ABNORMAL HIGH (ref 7.5–12.5)
Platelets: 316 10*3/uL (ref 140–400)
RBC: 3.37 MIL/uL — AB (ref 4.20–5.80)
RDW: 14.3 % (ref 11.0–15.0)
WBC: 8.5 10*3/uL (ref 3.8–10.8)

## 2016-02-26 LAB — GLUCOSE, POCT (MANUAL RESULT ENTRY): POC Glucose: 178 mg/dl — AB (ref 70–99)

## 2016-02-26 MED ORDER — ONDANSETRON 8 MG PO TBDP: 8.0000 mg | ORAL_TABLET | Freq: Three times a day (TID) | ORAL | 0 refills | Status: DC | PRN

## 2016-02-26 MED ORDER — PANTOPRAZOLE SODIUM 40 MG PO TBEC: 40.0000 mg | DELAYED_RELEASE_TABLET | Freq: Every day | ORAL | 5 refills | Status: DC

## 2016-02-26 MED ORDER — TRAZODONE HCL 100 MG PO TABS: 100.0000 mg | ORAL_TABLET | Freq: Every day | ORAL | 5 refills | Status: DC

## 2016-02-26 MED FILL — DOXYCYCLINE 100 MG TABLET: 100 | 7 days supply | Qty: 14 | Fill #0

## 2016-02-26 MED FILL — SILVER SULFADIAZINE 1% CRM: 1 | 20 days supply | Qty: 50 | Fill #0

## 2016-02-26 NOTE — Patient Instructions (Addendum)
Shane [name] was seen today for hospitalization follow-up.  Diagnoses and all orders for this visit:  Diabetic ulcer of right foot associated with type 2 diabetes mellitus, with necrosis of muscle (HCC) -     Glucose (CBG)  Gastroesophageal reflux disease without esophagitis -     ondansetron (ZOFRAN ODT) 8 MG disintegrating tablet; Take 1 tablet (8 mg total) by mouth every 8 (eight) hours as needed for nausea or vomiting. -     pantoprazole (PROTONIX) 40 MG tablet; Take 1 tablet (40 mg total) by mouth daily.  Chest pain, unspecified chest pain type -     EKG 12-Lead  Dizziness -     CBC -     Vitamin D, 25-hydroxy  Insomnia -     traZODone (DESYREL) 100 MG tablet; Take 1 tablet (100 mg total) by mouth at bedtime.  Shortness of breath -     DG Chest 2 View; Future -     Brain natriuretic peptide  EKG is normal I suspect chest pain is GERD related and shortness of breath is anemia related Will assess for fluid in lungs with chest x-ray  Checking labs as well, CBC and vit D and BNP  stop omeprazole and take Protonix  Return to ED if chest pains or shortness of breath become severe and do not resolve with rest   F/u in  2 weeks for chest pain and shortness of breath   Dr. Adrian Blackwater

## 2016-02-26 NOTE — Progress Notes (Signed)
Subjective:  Patient ID: Shane [name], male    DOB: 09/16/1970  Age: 45 y.o. MRN: [id number]  CC: Hospitalization Follow-up   HPI Shane [name] has depression, poorly controlled diabetes, insomnia,  HTN,   presents for    1. HFU diabetic foot ulcers wounds: he has b/l dorsal diabetic foot wounds. His R foot wound grew out MSSA he is taking doxycycline. He was hospitalized from 9/5-02/23/2016. He has f/u with ortho in 3 days. He continues to take doxycycline.  Today he reports dizziness, chest pains, shortness of breath with activity, pain and bleeding from dorsum of his feet. He reports he had a fall yesterday while walking around with walker. He denies fever and GI bleed. He has known IDA and is compliant with iron therapy. His chest pain is with rest and exacerbated by lying flat. He has GERD and is taking omeprazole. His lowest blood sugar at home has been  98.   2. DM2: compliant with glipizide 10 mg BID and metformin 1000 mg BID.  CBG 68 x 1 last week. No CBGs > 200.     Social History  Substance Use Topics  . Smoking status: Never Smoker  . Smokeless tobacco: Never Used  . Alcohol use No   Past Surgical History:  Procedure Laterality Date  . AMPUTATION Left 08/03/2015   Procedure: AMPUTATION LEFT GREAT TOE;  Surgeon: Newt Minion, MD;  Location: [facility];  Service: Orthopedics;  Laterality: Left;  . AMPUTATION Left 12/02/2015   Procedure: amputation of left 2nd digit  . APPLICATION OF WOUND VAC Left 12/19/2015   Procedure: APPLICATION OF WOUND VAC;  Surgeon: Meredith Pel, MD;  Location: [facility];  Service: Orthopedics;  Laterality: Left;  . CIRCUMCISION    . COLONOSCOPY N/A 01/22/2016   Procedure: COLONOSCOPY;  Surgeon: Gatha Mayer, MD;  Location: [facility];  Service: Endoscopy;  Laterality: N/A;  . ESOPHAGOGASTRODUODENOSCOPY N/A 01/19/2016   Procedure: ESOPHAGOGASTRODUODENOSCOPY (EGD);  Surgeon: Manus Gunning, MD;  Location: [facility];  Service: Gastroenterology;   Laterality: N/A;  . ESOPHAGOGASTRODUODENOSCOPY N/A 01/22/2016   Procedure: ESOPHAGOGASTRODUODENOSCOPY (EGD);  Surgeon: Gatha Mayer, MD;  Location[facility] END[facility]COPY;  Service: Endoscopy;  Laterality: N/A;  . I&D EXTREMITY Left 12/02/2015   Procedure: IRRIGATION AND DEBRIDEMENT OF FOOT; LEFT SECOND TOE AMPUTATION;  Surgeon: Meredith Pel, MD;  Location: [facility];  Service: Orthopedics;  Laterality: Left;  . I&D EXTREMITY Left 12/19/2015   Procedure: I & D LEFT FOOT WITH BEADS ;  Surgeon: Meredith Pel, MD;  Location: [facility];  Service: Orthopedics;  Laterality: Left;  . I&D EXTREMITY Right 01/17/2016   Procedure: IRRIGATION AND DEBRIDEMENT RIGHT FOOT;  Surgeon: Newt Minion, MD;  Location: [facility];  Service: Orthopedics;  Laterality: Right;  . INCISION AND DRAINAGE FOOT Right 01/17/2016  . INGUINAL HERNIA REPAIR Bilateral ~ 1983- ~ 1986  . TONSILLECTOMY  ~ 1985    Outpatient Medications Prior to Visit  Medication Sig Dispense Refill  . amLODipine (NORVASC) 10 MG tablet Take 1 tablet (10 mg total) by mouth daily. 30 tablet 0  . aspirin EC 325 MG tablet Take 1 tablet (325 mg total) by mouth daily. 30 tablet 0  . atorvastatin (LIPITOR) 80 MG tablet Take 1 tablet (80 mg total) by mouth every morning. 30 tablet 5  . carvedilol (COREG) 12.5 MG tablet TAKE 1 TABLET BY MOUTH 2 TIMES DAILY WITH A MEAL 60 tablet 3  . divalproex (DEPAKOTE) 500 MG DR tablet Take  2 tablets (1,000 mg total) by mouth at bedtime. 30 tablet 5  . doxycycline (VIBRA-TABS) 100 MG tablet Take 1 tablet (100 mg total) by mouth 2 (two) times daily. 14 tablet 0  . ferrous sulfate 325 (65 FE) MG tablet Take 1 tablet (325 mg total) by mouth 2 (two) times daily with a meal. (Patient taking differently: Take 325 mg by mouth daily with breakfast. ) 60 tablet 5  . FLUoxetine (PROZAC) 10 MG tablet Take 1 tablet (10 mg total) by mouth every morning. 30 tablet 3  . gabapentin (NEURONTIN) 100 MG capsule Take 2 capsules (200 mg total) by mouth 3  (three) times daily. 90 capsule 0  . glipiZIDE (GLUCOTROL) 10 MG tablet Take 1 tablet (10 mg total) by mouth 2 (two) times daily before a meal. (Patient taking differently: Take 10 mg by mouth daily before breakfast. ) 60 tablet 5  . glucose blood (TRUE METRIX BLOOD GLUCOSE TEST) test strip Use as instructed 100 each 12  . HYDROcodone-acetaminophen (NORCO/VICODIN) 5-325 MG tablet Take 1 tablet by mouth every 6 (six) hours as needed for moderate pain. 12 tablet 0  . ibuprofen (ADVIL,MOTRIN) 800 MG tablet TAKE 1 TABLET BY MOUTH EVERY 8 HOURS AS NEEDED. (Patient taking differently: TAKE 1 TABLET BY MOUTH EVERY 8 HOURS AS NEEDED FOR PAIN) 30 tablet 0  . lisinopril (PRINIVIL,ZESTRIL) 40 MG tablet Take 1 tablet (40 mg total) by mouth daily. 30 tablet 5  . metFORMIN (GLUCOPHAGE) 1000 MG tablet Take 1 tablet (1,000 mg total) by mouth 2 (two) times daily with a meal. 60 tablet 5  . omeprazole (PRILOSEC) 20 MG capsule Take 1 capsule (20 mg total) by mouth daily. 30 capsule 5  . saccharomyces boulardii (FLORASTOR) 250 MG capsule Take 1 capsule (250 mg total) by mouth 2 (two) times daily. 60 capsule 3  . silver sulfADIAZINE (SILVADENE) 1 % cream Apply topically daily. 50 g 0  . traZODone (DESYREL) 50 MG tablet Take 0.5-1 tablets (25-50 mg total) by mouth at bedtime as needed for sleep. 30 tablet 3  . TRUEPLUS LANCETS 28G MISC Use as directed 100 each 12   No facility-administered medications prior to visit.     ROS Review of Systems  Constitutional: Positive for fatigue. Negative for chills, fever and unexpected weight change.  Eyes: Negative for visual disturbance.  Respiratory: Positive for cough and shortness of breath.   Cardiovascular: Positive for chest pain and leg swelling. Negative for palpitations.  Gastrointestinal: Negative for abdominal pain, blood in stool, constipation, diarrhea, nausea and vomiting.  Endocrine: Negative for polydipsia, polyphagia and polyuria.  Musculoskeletal: Positive  for arthralgias and gait problem. Negative for back pain, myalgias and neck pain.  Skin: Positive for wound. Negative for rash.  Allergic/Immunologic: Negative for immunocompromised state.  Neurological: Positive for dizziness.  Hematological: Negative for adenopathy. Does not bruise/bleed easily.  Psychiatric/Behavioral: Negative for dysphoric mood and suicidal ideas. The patient is not nervous/anxious.     Objective:  BP 129/80 (BP Location: Right Arm, Patient Position: Sitting, Cuff Size: Normal)   Pulse 73   Temp 97.8 F (36.6 C) (Oral)   Resp 18   Ht 5\' 8"  (1.727 m)   Wt 187 lb 6.4 oz (85 kg)   SpO2 99%   BMI 28.49 kg/m   BP/Weight 02/26/2016 [id number]  Systolic BP [id number] -  Diastolic BP 80 95 -  Wt. (Lbs) 187.4 - 173  BMI 28.49 - 26.3    Physical Exam  Constitutional: He appears  well-developed and well-nourished. No distress.  HENT:  Head: Normocephalic and atraumatic.  Neck: Normal range of motion. Neck supple.  Cardiovascular: Normal rate, regular rhythm, normal heart sounds and intact distal pulses.   Pulses:      Femoral pulses are 1+ on the right side, and 1+ on the left side.      Popliteal pulses are 1+ on the right side, and 1+ on the left side.       Dorsalis pedis pulses are 1+ on the right side, and 1+ on the left side.       Posterior tibial pulses are 1+ on the right side, and 1+ on the left side.  Pulmonary/Chest: Effort normal and breath sounds normal.  Musculoskeletal: He exhibits no edema.       Feet:  Neurological: He is alert.  Skin: Skin is warm and dry. No rash noted. No erythema.  Psychiatric: He has a normal mood and affect.    Lab Results  Component Value Date   HGBA1C 8.0 (H) 01/01/2016    CBG 189  EKG: normal EKG, normal sinus rhythm, unchanged from previous tracings.  Assessment & Plan:  Shane [name] was seen today for hospitalization follow-up.  Diagnoses and all orders for this visit:  Uncontrolled type 2 diabetes  mellitus with complication, without long-term current use of insulin (HCC) -     Glucose (CBG) -     Insulin Glargine (LANTUS SOLOSTAR) 100 UNIT/ML Solostar Pen; Inject 10 Units into the skin daily. -     glipiZIDE (GLIPIZIDE XL) 10 MG 24 hr tablet; Take 1 tablet (10 mg total) by mouth daily with breakfast. -     Insulin Pen Needle (B-D ULTRAFINE III SHORT PEN) 31G X 8 MM MISC; 1 application by Does not apply route daily.  Diabetic ulcer of right foot associated with type 2 diabetes mellitus, with necrosis of muscle (HCC)  Gastroesophageal reflux disease without esophagitis -     ondansetron (ZOFRAN ODT) 8 MG disintegrating tablet; Take 1 tablet (8 mg total) by mouth every 8 (eight) hours as needed for nausea or vomiting. -     pantoprazole (PROTONIX) 40 MG tablet; Take 1 tablet (40 mg total) by mouth daily.  Chest pain, unspecified chest pain type -     EKG 12-Lead -     Ambulatory referral to Cardiology  Dizziness -     CBC -     Vitamin D, 25-hydroxy  Insomnia -     traZODone (DESYREL) 100 MG tablet; Take 1 tablet (100 mg total) by mouth at bedtime.  Shortness of breath -     DG Chest 2 View; Future -     Brain natriuretic peptide  Amputated toe of left foot (HCC)  Anemia, iron deficiency    No orders of the defined types were placed in this encounter.   Follow-up: Return in about 2 weeks (around 03/11/2016) for chest pains and shortness of breath .   Boykin Nearing MD

## 2016-02-26 NOTE — Progress Notes (Signed)
Patient is here for HFU  Patient complains of bilateral foot pain at his wound sites. Pain is scaled at a 5.  Patient has taken medication today and patient has eaten today.  Patient had a fall yesterday which caused his wounds to bleed.

## 2016-02-27 LAB — VITAMIN D 25 HYDROXY (VIT D DEFICIENCY, FRACTURES): Vit D, 25-Hydroxy: 25 ng/mL — ABNORMAL LOW (ref 30–100)

## 2016-02-27 LAB — BRAIN NATRIURETIC PEPTIDE: Brain Natriuretic Peptide: 22.4 pg/mL (ref <100)

## 2016-02-27 MED FILL — PANTOPRAZOLE SOD DR 40 MG T: 40 | 30 days supply | Qty: 30 | Fill #0

## 2016-02-27 MED FILL — ONDANSETRON ODT 8 MG TABLET: 8 | 10 days supply | Qty: 30 | Fill #0

## 2016-02-27 MED FILL — traZODone HCL 100 MG TABS: 100 | 30 days supply | Qty: 30 | Fill #0

## 2016-02-28 ENCOUNTER — Telehealth: Payer: Self-pay | Admitting: Family Medicine

## 2016-02-28 ENCOUNTER — Telehealth: Payer: Self-pay | Admitting: Hematology and Oncology

## 2016-02-28 DIAGNOSIS — L97513 Non-pressure chronic ulcer of other part of right foot with necrosis of muscle: Principal | ICD-10-CM

## 2016-02-28 DIAGNOSIS — E11621 Type 2 diabetes mellitus with foot ulcer: Secondary | ICD-10-CM

## 2016-02-28 MED FILL — NITROGLYCERIN 0.2 MG/HR PTC: 0.2 | 30 days supply | Qty: 30 | Fill #0

## 2016-02-28 NOTE — Telephone Encounter (Signed)
03/08/2016 Appointment rescheduled to 03/12/2016 per patient request. Patient asked to be schedule later in afternoon.

## 2016-02-28 NOTE — Telephone Encounter (Signed)
Will route to PCP. He needs this referral for his feet

## 2016-02-28 NOTE — Telephone Encounter (Signed)
Pt called requesting a call back from nurse, states he need to be referred out to infectious disease. Please f/up

## 2016-02-29 ENCOUNTER — Ambulatory Visit (HOSPITAL_COMMUNITY)
Admission: RE | Admit: 2016-02-29 | Discharge: 2016-02-29 | Disposition: A | Discharge status: Home / Self Care | Payer: Medicaid Other | Source: Ambulatory Visit | Attending: Family Medicine | Admitting: Family Medicine

## 2016-02-29 DIAGNOSIS — R0602 Shortness of breath: Secondary | ICD-10-CM | POA: Diagnosis present

## 2016-02-29 DIAGNOSIS — R918 Other nonspecific abnormal finding of lung field: Secondary | ICD-10-CM | POA: Diagnosis not present

## 2016-02-29 NOTE — Telephone Encounter (Signed)
ID referral placed

## 2016-03-03 ENCOUNTER — Telehealth: Payer: Self-pay | Admitting: Family Medicine

## 2016-03-03 DIAGNOSIS — E559 Vitamin D deficiency, unspecified: Secondary | ICD-10-CM

## 2016-03-03 DIAGNOSIS — R42 Dizziness and giddiness: Secondary | ICD-10-CM | POA: Insufficient documentation

## 2016-03-03 MED ORDER — INSULIN PEN NEEDLE 31G X 8 MM MISC: 1.0000 "application " | Freq: Every day | 11 refills | Status: DC

## 2016-03-03 MED ORDER — INSULIN GLARGINE 100 UNIT/ML SOLOSTAR PEN: 10.0000 [IU] | PEN_INJECTOR | Freq: Every day | SUBCUTANEOUS | 5 refills | Status: DC

## 2016-03-03 MED ORDER — GLIPIZIDE ER 10 MG PO TB24: 10.0000 mg | ORAL_TABLET | Freq: Every day | ORAL | 3 refills | Status: DC

## 2016-03-03 NOTE — Assessment & Plan Note (Signed)
11/2015 ABI all > 1 12/2015 normal vascular ultrasound

## 2016-03-03 NOTE — Assessment & Plan Note (Signed)
Dizziness with reported hypoglycemia Hemodynamically stable hgb stable Vit D improved to 25  Plan: Stop glipizide 10 mg BID Change to XL glipizide

## 2016-03-03 NOTE — Assessment & Plan Note (Signed)
Diabetic foot ulcers in both feet Plan; F/u with ortho Infectious disease referral placed

## 2016-03-03 NOTE — Assessment & Plan Note (Signed)
A; insomnia with depression  P: Increase trazodone to 100 mg nightly

## 2016-03-03 NOTE — Assessment & Plan Note (Signed)
A: severe anemia on iron therapy P:  repeat CBC stable  Continue iron therapy

## 2016-03-03 NOTE — Telephone Encounter (Signed)
Please call patient 1. Changed glipizide to 10 mg XL once daily in order to avoid hypoglycemia 2. Added lantus 10 U once daily insulin to reach A1c goal < 7 3. Placed referral to cardiology for chest pains in order to have coronary arteries checked

## 2016-03-03 NOTE — Assessment & Plan Note (Signed)
Complaint of CP in diabetes, HTN, HLD patient with normal exam Stable EKG Normal bnp Stable anemia Suspect GERD, patient is high risk for CAD. Taking statin, ASA and beta blocker  Plan: Change prilosec to protonix Card referral

## 2016-03-03 NOTE — Assessment & Plan Note (Signed)
A: uncontrolled diabetes with diabetic foot wounds and hypoglycemic episode P: Change glipizide from 10 mg BID to 10 mg XL daily Continue metformin 1000 mg BID Add lantus 10 U daily  Goal: A1c < 7

## 2016-03-04 MED ORDER — VITAMIN D (ERGOCALCIFEROL) 1.25 MG (50000 UNIT) PO CAPS: 50000.0000 [IU] | ORAL_CAPSULE | ORAL | 0 refills | Status: DC

## 2016-03-04 MED FILL — LANTUS SOLOSTAR 100 UNITS/M: 100 | 30 days supply | Qty: 3 | Fill #0

## 2016-03-04 MED FILL — glipiZIDE ER 10 MG TB24: 10 | 30 days supply | Qty: 30 | Fill #0

## 2016-03-04 MED FILL — BD PEN NDL MINI 31GX5MM: 31G X 5 MM | 30 days supply | Qty: 100 | Fill #0

## 2016-03-04 NOTE — Telephone Encounter (Signed)
Called patient Verified name and DOB  Gave update plan regarding diabetes and chest pain, patient agreed with plan and voiced understanding.  Please call patient 1. Changed glipizide to 10 mg XL once daily in order to avoid hypoglycemia 2. Added lantus 10 U once daily insulin to reach A1c goal < 7 3. Placed referral to cardiology for chest pains in order to have coronary arteries checked. Patient informed me he had a normal stress test during one of his hospitalization from 01/11/16 to 01/23/2016 "Stress test is negative for inducible ischemia and low yield study"  Referral canceled.   Gave CXR results and instructions X ray with low lung volumes but no bronchitis or pneumonia Use incentive spirometry to help open airway this is a clear tube with a big straw on the end you suck on You may have gotten one from the hospital, if not you can get it for $15 at [place] with One-Way Valve   Gave lab results  Vit D has improved to 25, continue vitamin D aiming  Added vit D 3 50000 IU monthly   Hemoglobin has improved to 10, continue iron  Be sure to drink plenty of water Up as tolerated only Elevate feet as much as possible to reduce swelling and promote wound healing  Normal BNP No sign of heart failure

## 2016-03-07 ENCOUNTER — Ambulatory Visit: Payer: Self-pay | Admitting: Hematology and Oncology

## 2016-03-08 ENCOUNTER — Emergency Department (HOSPITAL_COMMUNITY): Payer: Medicaid Other

## 2016-03-08 ENCOUNTER — Observation Stay (HOSPITAL_COMMUNITY)
Admission: EM | Admit: 2016-03-08 | Discharge: 2016-03-09 | Disposition: A | Discharge status: Home / Self Care | Payer: Medicaid Other | Attending: Internal Medicine | Admitting: Internal Medicine

## 2016-03-08 ENCOUNTER — Observation Stay (HOSPITAL_COMMUNITY): Payer: Medicaid Other

## 2016-03-08 ENCOUNTER — Ambulatory Visit: Payer: Self-pay | Admitting: Hematology and Oncology

## 2016-03-08 ENCOUNTER — Encounter (HOSPITAL_COMMUNITY): Payer: Self-pay | Admitting: Emergency Medicine

## 2016-03-08 DIAGNOSIS — R55 Syncope and collapse: Secondary | ICD-10-CM | POA: Diagnosis not present

## 2016-03-08 DIAGNOSIS — R531 Weakness: Secondary | ICD-10-CM

## 2016-03-08 DIAGNOSIS — Z7982 Long term (current) use of aspirin: Secondary | ICD-10-CM | POA: Diagnosis not present

## 2016-03-08 DIAGNOSIS — Z7984 Long term (current) use of oral hypoglycemic drugs: Secondary | ICD-10-CM | POA: Diagnosis not present

## 2016-03-08 DIAGNOSIS — M6281 Muscle weakness (generalized): Secondary | ICD-10-CM | POA: Diagnosis not present

## 2016-03-08 DIAGNOSIS — Z794 Long term (current) use of insulin: Secondary | ICD-10-CM | POA: Insufficient documentation

## 2016-03-08 DIAGNOSIS — W1830XA Fall on same level, unspecified, initial encounter: Secondary | ICD-10-CM | POA: Diagnosis not present

## 2016-03-08 DIAGNOSIS — L97413 Non-pressure chronic ulcer of right heel and midfoot with necrosis of muscle: Secondary | ICD-10-CM | POA: Insufficient documentation

## 2016-03-08 DIAGNOSIS — E1165 Type 2 diabetes mellitus with hyperglycemia: Secondary | ICD-10-CM | POA: Diagnosis not present

## 2016-03-08 DIAGNOSIS — Z79899 Other long term (current) drug therapy: Secondary | ICD-10-CM | POA: Insufficient documentation

## 2016-03-08 DIAGNOSIS — M6289 Other specified disorders of muscle: Secondary | ICD-10-CM | POA: Diagnosis not present

## 2016-03-08 DIAGNOSIS — L03119 Cellulitis of unspecified part of limb: Secondary | ICD-10-CM | POA: Diagnosis present

## 2016-03-08 DIAGNOSIS — R0789 Other chest pain: Secondary | ICD-10-CM | POA: Insufficient documentation

## 2016-03-08 DIAGNOSIS — E11621 Type 2 diabetes mellitus with foot ulcer: Secondary | ICD-10-CM | POA: Insufficient documentation

## 2016-03-08 DIAGNOSIS — I10 Essential (primary) hypertension: Secondary | ICD-10-CM | POA: Diagnosis present

## 2016-03-08 DIAGNOSIS — I5032 Chronic diastolic (congestive) heart failure: Secondary | ICD-10-CM | POA: Insufficient documentation

## 2016-03-08 DIAGNOSIS — E118 Type 2 diabetes mellitus with unspecified complications: Secondary | ICD-10-CM | POA: Diagnosis not present

## 2016-03-08 DIAGNOSIS — I110 Hypertensive heart disease with heart failure: Secondary | ICD-10-CM | POA: Diagnosis not present

## 2016-03-08 DIAGNOSIS — Y939 Activity, unspecified: Secondary | ICD-10-CM | POA: Diagnosis not present

## 2016-03-08 DIAGNOSIS — R079 Chest pain, unspecified: Secondary | ICD-10-CM

## 2016-03-08 DIAGNOSIS — Y929 Unspecified place or not applicable: Secondary | ICD-10-CM | POA: Insufficient documentation

## 2016-03-08 DIAGNOSIS — S99921A Unspecified injury of right foot, initial encounter: Secondary | ICD-10-CM | POA: Diagnosis present

## 2016-03-08 DIAGNOSIS — F418 Other specified anxiety disorders: Secondary | ICD-10-CM | POA: Diagnosis present

## 2016-03-08 DIAGNOSIS — IMO0002 Reserved for concepts with insufficient information to code with codable children: Secondary | ICD-10-CM | POA: Diagnosis present

## 2016-03-08 DIAGNOSIS — S91301A Unspecified open wound, right foot, initial encounter: Secondary | ICD-10-CM | POA: Diagnosis not present

## 2016-03-08 DIAGNOSIS — Y999 Unspecified external cause status: Secondary | ICD-10-CM | POA: Insufficient documentation

## 2016-03-08 DIAGNOSIS — Z8673 Personal history of transient ischemic attack (TIA), and cerebral infarction without residual deficits: Secondary | ICD-10-CM | POA: Insufficient documentation

## 2016-03-08 DIAGNOSIS — I639 Cerebral infarction, unspecified: Secondary | ICD-10-CM

## 2016-03-08 LAB — URINALYSIS, ROUTINE W REFLEX MICROSCOPIC
Bilirubin Urine: NEGATIVE
Glucose, UA: 100 mg/dL — AB
Ketones, ur: NEGATIVE mg/dL
Leukocytes, UA: NEGATIVE
Nitrite: NEGATIVE
Protein, ur: 100 mg/dL — AB
Specific Gravity, Urine: 1.04 — ABNORMAL HIGH (ref 1.005–1.030)
pH: 6.5 (ref 5.0–8.0)

## 2016-03-08 LAB — DIFFERENTIAL
Basophils Absolute: 0 10*3/uL (ref 0.0–0.1)
Basophils Relative: 0 %
Eosinophils Absolute: 0.1 10*3/uL (ref 0.0–0.7)
Eosinophils Relative: 2 %
Lymphocytes Relative: 27 %
Lymphs Abs: 1.6 10*3/uL (ref 0.7–4.0)
Monocytes Absolute: 0.4 10*3/uL (ref 0.1–1.0)
Monocytes Relative: 6 %
Neutro Abs: 3.9 10*3/uL (ref 1.7–7.7)
Neutrophils Relative %: 65 %

## 2016-03-08 LAB — RAPID URINE DRUG SCREEN, HOSP PERFORMED
Amphetamines: NOT DETECTED
Barbiturates: NOT DETECTED
Benzodiazepines: NOT DETECTED
Cocaine: NOT DETECTED
Opiates: NOT DETECTED
Tetrahydrocannabinol: NOT DETECTED

## 2016-03-08 LAB — CBC
HCT: 31.8 % — ABNORMAL LOW (ref 39.0–52.0)
Hemoglobin: 10 g/dL — ABNORMAL LOW (ref 13.0–17.0)
MCH: 29.9 pg (ref 26.0–34.0)
MCHC: 31.4 g/dL (ref 30.0–36.0)
MCV: 94.9 fL (ref 78.0–100.0)
Platelets: 241 10*3/uL (ref 150–400)
RBC: 3.35 MIL/uL — ABNORMAL LOW (ref 4.22–5.81)
RDW: 14.3 % (ref 11.5–15.5)
WBC: 6 10*3/uL (ref 4.0–10.5)

## 2016-03-08 LAB — I-STAT TROPONIN, ED: Troponin i, poc: 0.01 ng/mL (ref 0.00–0.08)

## 2016-03-08 LAB — COMPREHENSIVE METABOLIC PANEL
ALT: 12 U/L — ABNORMAL LOW (ref 17–63)
AST: 17 U/L (ref 15–41)
Albumin: 3.3 g/dL — ABNORMAL LOW (ref 3.5–5.0)
Alkaline Phosphatase: 54 U/L (ref 38–126)
Anion gap: 10 (ref 5–15)
BUN: 22 mg/dL — ABNORMAL HIGH (ref 6–20)
CO2: 23 mmol/L (ref 22–32)
Calcium: 9.1 mg/dL (ref 8.9–10.3)
Chloride: 106 mmol/L (ref 101–111)
Creatinine, Ser: 1.21 mg/dL (ref 0.61–1.24)
GFR calc Af Amer: >60 mL/min (ref >60)
GFR calc non Af Amer: >60 mL/min (ref >60)
Glucose, Bld: 224 mg/dL — ABNORMAL HIGH (ref 65–99)
Potassium: 4.8 mmol/L (ref 3.5–5.1)
Sodium: 139 mmol/L (ref 135–145)
Total Bilirubin: 0.2 mg/dL — ABNORMAL LOW (ref 0.3–1.2)
Total Protein: 6.5 g/dL (ref 6.5–8.1)

## 2016-03-08 LAB — I-STAT CHEM 8, ED
BUN: 25 mg/dL — ABNORMAL HIGH (ref 6–20)
Calcium, Ion: 1.14 mmol/L — ABNORMAL LOW (ref 1.15–1.40)
Chloride: 103 mmol/L (ref 101–111)
Creatinine, Ser: 1.2 mg/dL (ref 0.61–1.24)
Glucose, Bld: 222 mg/dL — ABNORMAL HIGH (ref 65–99)
HCT: 32 % — ABNORMAL LOW (ref 39.0–52.0)
Hemoglobin: 10.9 g/dL — ABNORMAL LOW (ref 13.0–17.0)
Potassium: 4.7 mmol/L (ref 3.5–5.1)
Sodium: 140 mmol/L (ref 135–145)
TCO2: 25 mmol/L (ref 0–100)

## 2016-03-08 LAB — URINE MICROSCOPIC-ADD ON: Squamous Epithelial / LPF: NONE SEEN

## 2016-03-08 LAB — ETHANOL: Alcohol, Ethyl (B): <5 mg/dL (ref <5)

## 2016-03-08 LAB — CBG MONITORING, ED
GLUCOSE-CAPILLARY: 134 mg/dL — AB (ref 65–99)
Glucose-Capillary: 214 mg/dL — ABNORMAL HIGH (ref 65–99)

## 2016-03-08 LAB — PROTIME-INR
INR: 1.08
Prothrombin Time: 14.1 seconds (ref 11.4–15.2)

## 2016-03-08 LAB — TROPONIN I
Troponin I: <0.03 ng/mL (ref <0.03)
Troponin I: <0.03 ng/mL (ref <0.03)

## 2016-03-08 LAB — GLUCOSE, CAPILLARY
GLUCOSE-CAPILLARY: 126 mg/dL — AB (ref 65–99)
Glucose-Capillary: 114 mg/dL — ABNORMAL HIGH (ref 65–99)

## 2016-03-08 LAB — APTT: aPTT: 26 seconds (ref 24–36)

## 2016-03-08 LAB — VALPROIC ACID LEVEL: Valproic Acid Lvl: 21 ug/mL — ABNORMAL LOW (ref 50.0–100.0)

## 2016-03-08 MED ORDER — ACETAMINOPHEN 325 MG PO TABS: 650.0000 mg | ORAL_TABLET | ORAL | Status: DC | PRN

## 2016-03-08 MED ORDER — SACCHAROMYCES BOULARDII 250 MG PO CAPS
250.0000 mg | ORAL_CAPSULE | Freq: Two times a day (BID) | ORAL | Status: DC
  Administered 2016-03-08 – 2016-03-09 (×2): 250 mg via ORAL
  Filled 2016-03-08 (×2): qty 1

## 2016-03-08 MED ORDER — INSULIN GLARGINE 100 UNIT/ML ~~LOC~~ SOLN
10.0000 [IU] | Freq: Every day | SUBCUTANEOUS | Status: DC
  Filled 2016-03-08: qty 0.1

## 2016-03-08 MED ORDER — HYDROCODONE-ACETAMINOPHEN 5-325 MG PO TABS
1.0000 | ORAL_TABLET | Freq: Four times a day (QID) | ORAL | Status: DC | PRN
  Administered 2016-03-09 (×2): 1 via ORAL
  Filled 2016-03-08 (×2): qty 1

## 2016-03-08 MED ORDER — ONDANSETRON HCL 4 MG/2ML IJ SOLN: 4.0000 mg | Freq: Four times a day (QID) | INTRAMUSCULAR | Status: DC | PRN

## 2016-03-08 MED ORDER — IOPAMIDOL (ISOVUE-370) INJECTION 76%
INTRAVENOUS | Status: AC
  Administered 2016-03-08: 50 mL
  Filled 2016-03-08: qty 50

## 2016-03-08 MED ORDER — LISINOPRIL 40 MG PO TABS
40.0000 mg | ORAL_TABLET | Freq: Every day | ORAL | Status: DC
  Administered 2016-03-09: 40 mg via ORAL
  Filled 2016-03-08: qty 1

## 2016-03-08 MED ORDER — CARVEDILOL 12.5 MG PO TABS
12.5000 mg | ORAL_TABLET | Freq: Two times a day (BID) | ORAL | Status: DC
  Administered 2016-03-09: 12.5 mg via ORAL
  Filled 2016-03-08: qty 1

## 2016-03-08 MED ORDER — ENOXAPARIN SODIUM 40 MG/0.4ML ~~LOC~~ SOLN
40.0000 mg | SUBCUTANEOUS | Status: DC
  Administered 2016-03-08: 40 mg via SUBCUTANEOUS
  Filled 2016-03-08: qty 0.4

## 2016-03-08 MED ORDER — DIVALPROEX SODIUM 500 MG PO DR TAB
1000.0000 mg | DELAYED_RELEASE_TABLET | Freq: Every day | ORAL | Status: DC
  Administered 2016-03-08: 1000 mg via ORAL
  Filled 2016-03-08: qty 2

## 2016-03-08 MED ORDER — TRAZODONE HCL 100 MG PO TABS
100.0000 mg | ORAL_TABLET | Freq: Every day | ORAL | Status: DC
  Administered 2016-03-08: 100 mg via ORAL
  Filled 2016-03-08: qty 1

## 2016-03-08 MED ORDER — FLUOXETINE HCL 10 MG PO CAPS
10.0000 mg | ORAL_CAPSULE | ORAL | Status: DC
  Administered 2016-03-09: 10 mg via ORAL
  Filled 2016-03-08: qty 1

## 2016-03-08 MED ORDER — GABAPENTIN 100 MG PO CAPS
200.0000 mg | ORAL_CAPSULE | Freq: Three times a day (TID) | ORAL | Status: DC
  Administered 2016-03-08 – 2016-03-09 (×2): 200 mg via ORAL
  Filled 2016-03-08 (×2): qty 2

## 2016-03-08 MED ORDER — INSULIN ASPART 100 UNIT/ML ~~LOC~~ SOLN
0.0000 [IU] | SUBCUTANEOUS | Status: DC
  Administered 2016-03-08: 1 [IU] via SUBCUTANEOUS
  Administered 2016-03-09: 7 [IU] via SUBCUTANEOUS
  Administered 2016-03-09: 3 [IU] via SUBCUTANEOUS
  Administered 2016-03-09: 1 [IU] via SUBCUTANEOUS
  Administered 2016-03-09: 5 [IU] via SUBCUTANEOUS

## 2016-03-08 MED ORDER — HYDRALAZINE HCL 20 MG/ML IJ SOLN
10.0000 mg | Freq: Four times a day (QID) | INTRAMUSCULAR | Status: DC | PRN
Start: 2016-03-08 — End: 2016-03-09

## 2016-03-08 MED ORDER — DOXYCYCLINE HYCLATE 100 MG PO TABS
100.0000 mg | ORAL_TABLET | Freq: Two times a day (BID) | ORAL | Status: DC
  Administered 2016-03-08 – 2016-03-09 (×2): 100 mg via ORAL
  Filled 2016-03-08 (×2): qty 1

## 2016-03-08 MED ORDER — FLUOXETINE HCL 20 MG PO TABS
10.0000 mg | ORAL_TABLET | ORAL | Status: DC
  Filled 2016-03-08: qty 1

## 2016-03-08 MED ORDER — ASPIRIN EC 325 MG PO TBEC
325.0000 mg | DELAYED_RELEASE_TABLET | Freq: Every day | ORAL | Status: DC
  Administered 2016-03-09: 325 mg via ORAL
  Filled 2016-03-08: qty 1

## 2016-03-08 MED ORDER — ASPIRIN 81 MG PO CHEW: 324.0000 mg | CHEWABLE_TABLET | Freq: Once | ORAL | Status: DC

## 2016-03-08 MED ORDER — ATORVASTATIN CALCIUM 80 MG PO TABS
80.0000 mg | ORAL_TABLET | Freq: Every morning | ORAL | Status: DC
  Administered 2016-03-09: 80 mg via ORAL
  Filled 2016-03-08: qty 1

## 2016-03-08 MED ORDER — IBUPROFEN 800 MG PO TABS: 800.0000 mg | ORAL_TABLET | Freq: Three times a day (TID) | ORAL | Status: DC | PRN

## 2016-03-08 MED ORDER — PANTOPRAZOLE SODIUM 40 MG PO TBEC
40.0000 mg | DELAYED_RELEASE_TABLET | Freq: Every day | ORAL | Status: DC
  Administered 2016-03-09: 40 mg via ORAL
  Filled 2016-03-08: qty 1

## 2016-03-08 MED ORDER — AMLODIPINE BESYLATE 10 MG PO TABS
10.0000 mg | ORAL_TABLET | Freq: Every day | ORAL | Status: DC
  Administered 2016-03-09: 10 mg via ORAL
  Filled 2016-03-08: qty 1

## 2016-03-08 NOTE — Consult Note (Signed)
Requesting Physician: Dr. Wilson Singer    Chief Complaint: code stroke  History obtained from:  Patient    HPI:                                                                                                                                         Shane [name] is an 45 y.o. male with a hx of TIA's, DM and HLP presents today after feeling dizzy and falling, following the fall realized he had L arm and L leg weakness along with CP  Date last known well: 03/08/16 Time last known well: 0900 tPA Given: No: exam inconsistent, MRI negative  Past Medical History:  Diagnosis Date  . Anemia   . Bipolar disorder ([place])   . Chest pain    a. 2015 Reportedly normal stress test in [place].  Marland Kitchen Chronic diastolic CHF (congestive heart failure) ([facility])    a.03/2015 Echo: EF 55-60%, Gr 1 DD, mild MR, triv PR.  . Depression with anxiety   . GERD (gastroesophageal reflux disease)   . Hyperlipidemia   . Hypertension    a. 08/2014 Admitted with hypertensive urgency.  . Internal carotid artery stenosis   . Lower GI bleed 07/04/2015  . Refusal of blood transfusions as patient is Jehov[employer]'s Witness   . Sleep apnea   . TIA (transient ischemic attack) 08/2014; 03/2015   a. 08/2014 in setting of hypertensive urgency.  . Type II diabetes mellitus ([place])    started insulin spring 2016  . Vitamin D deficiency spring 2016    Past Surgical History:  Procedure Laterality Date  . AMPUTATION Left 08/03/2015   Procedure: AMPUTATION LEFT GREAT TOE;  Surgeon: Newt Minion, MD;  Location: [place];  Service: Orthopedics;  Laterality: Left;  . AMPUTATION Left 12/02/2015   Procedure: amputation of left 2nd digit  . APPLICATION OF WOUND VAC Left 12/19/2015   Procedure: APPLICATION OF WOUND VAC;  Surgeon: Meredith Pel, MD;  Location: [place];  Service: Orthopedics;  Laterality: Left;  . CIRCUMCISION    . COLONOSCOPY N/A 01/22/2016   Procedure: COLONOSCOPY;  Surgeon: Gatha Mayer, MD;  Location: [place];  Service: Endoscopy;   Laterality: N/A;  . ESOPHAGOGASTRODUODENOSCOPY N/A 01/19/2016   Procedure: ESOPHAGOGASTRODUODENOSCOPY (EGD);  Surgeon: Manus Gunning, MD;  Location: [place];  Service: Gastroenterology;  Laterality: N/A;  . ESOPHAGOGASTRODUODENOSCOPY N/A 01/22/2016   Procedure: ESOPHAGOGASTRODUODENOSCOPY (EGD);  Surgeon: Gatha Mayer, MD;  Location[facility] ENDOSCOPY;  Service: Endoscopy;  Laterality: N/A;  . I&D EXTREMITY Left 12/02/2015   Procedure: IRRIGATION AND DEBRIDEMENT OF FOOT; LEFT SECOND TOE AMPUTATION;  Surgeon: Meredith Pel, MD;  Location: [place];  Service: Orthopedics;  Laterality: Left;  . I&D EXTREMITY Left 12/19/2015   Procedure: I & D LEFT FOOT WITH BEADS ;  Surgeon: Meredith Pel, MD;  Location: [place];  Service: Orthopedics;  Laterality: Left;  . I&D EXTREMITY  Right 01/17/2016   Procedure: IRRIGATION AND DEBRIDEMENT RIGHT FOOT;  Surgeon: Newt Minion, MD;  Location: [place];  Service: Orthopedics;  Laterality: Right;  . INCISION AND DRAINAGE FOOT Right 01/17/2016  . INGUINAL HERNIA REPAIR Bilateral ~ 1983- ~ 1986  . TONSILLECTOMY  ~ 36    Family History  Problem Relation Age of Onset  . Hypertension Mother   . Diabetes Mother   . Hyperlipidemia Mother   . Heart disease Mother     s/p pacemaker  . Diabetes Father   . Hypertension Father   . Stroke Father   . Heart attack Father     first MI @ 67.  . Stroke Brother    Social History:  reports that he has never smoked. He has never used smokeless tobacco. He reports that he does not drink alcohol or use drugs.  Allergies:  Allergies  Allergen Reactions  . Other Other (See Comments)    Red meat causes stomach pains, bloating and diarrhea  . Milk-Related Compounds Diarrhea and Other (See Comments)    Any dairy products  - diarrhea and bloating    Medications:                                                                                                                           I have reviewed the patient's  current medications.  ROS:                                                                                                                                       History obtained from chart review and the patient  General ROS: negative for - chills, fatigue, fever, night sweats, weight gain or weight loss Psychological ROS: negative for - behavioral disorder, hallucinations, memory difficulties, mood swings or suicidal ideation Ophthalmic ROS: negative for - blurry vision, double vision, eye pain or loss of vision ENT ROS: negative for - epistaxis, nasal discharge, oral lesions, sore throat, tinnitus or vertigo Allergy and Immunology ROS: negative for - hives or itchy/watery eyes Hematological and Lymphatic ROS: negative for - bleeding problems, bruising or swollen lymph nodes Endocrine ROS: negative for - galactorrhea, hair pattern changes, polydipsia/polyuria or temperature intolerance Respiratory ROS: negative for - cough, hemoptysis, shortness of breath or wheezing Cardiovascular ROS: negative for - chest pain, dyspnea on exertion, edema or irregular heartbeat Gastrointestinal ROS: negative for -  abdominal pain, diarrhea, hematemesis, nausea/vomiting or stool incontinence Genito-Urinary ROS: negative for - dysuria, hematuria, incontinence or urinary frequency/urgency Musculoskeletal ROS: negative for - joint swelling or muscular weakness Neurological ROS: as noted in HPI Dermatological ROS: negative for rash and skin lesion changes  Neurologic Examination:                                                                                                      Height 5\' 8"  (1.727 m), weight 85.2 kg (187 lb 13.3 oz).  HEENT-  Normocephalic, no lesions, without obvious abnormality.  Normal external eye and conjunctiva.  Normal TM's bilaterally.  Normal auditory canals and external ears. Normal external nose, mucus membranes and septum.  Normal pharynx. Cardiovascular- regular rate and rhythm, S1,  S2 normal, no murmur, click, rub or gallop, pulses palpable throughout   Lungs- chest clear, no wheezing, rales, normal symmetric air entry, Heart exam - S1, S2 normal, no murmur, no gallop, rate regular Abdomen- soft, non-tender; bowel sounds normal; no masses,  no organomegaly   Neurological Examination Mental Status: Alert, oriented, thought content appropriate.  Speech fluent without evidence of aphasia.  Able to follow 3 step commands without difficulty. Cranial Nerves: II:  Visual fields grossly normal, pupils equal, round, reactive to light and accommodation III,IV, VI: ptosis not present, extra-ocular motions intact bilaterally V,VII: smile symmetric, facial light touch sensation normal bilaterally VIII: hearing normal bilaterally IX,X: uvula rises symmetrically XI: bilateral shoulder shrug XII: midline tongue extension Motor: Right : Upper extremity   5/5    Left:     Upper extremity   2/5  Lower extremity   5/5     Lower extremity   2/5 Tone and bulk:normal tone throughout; no atrophy noted Sensory: Diminished L hemibody  Cerebellar: normal finger-to-nose on the R  NIHSS 6  Exam had some inconsistent weakness, giveaway, will get MRI         Lab Results: Basic Metabolic Panel:  Recent Labs Lab 03/08/16 1057  NA 140  K 4.7  CL 103  GLUCOSE 222*  BUN 25*  CREATININE 1.20    Liver Function Tests: No results for input(s): AST, ALT, ALKPHOS, BILITOT, PROT, ALBUMIN in the last 168 hours. No results for input(s): LIPASE, AMYLASE in the last 168 hours. No results for input(s): AMMONIA in the last 168 hours.  CBC:  Recent Labs Lab 03/08/16 1049 03/08/16 1057  WBC 6.0  --   NEUTROABS 3.9  --   HGB 10.0* 10.9*  HCT 31.8* 32.0*  MCV 94.9  --   PLT 241  --     Cardiac Enzymes: No results for input(s): CKTOTAL, CKMB, CKMBINDEX, TROPONINI in the last 168 hours.  Lipid Panel: No results for input(s): CHOL, TRIG, HDL, CHOLHDL, VLDL, LDLCALC in the last  168 hours.  CBG:  Recent Labs Lab 03/08/16 1047  GLUCAP 214*    Microbiology: Results for orders placed or performed during the hospital encounter of 02/20/16  Wound or Superficial Culture     Status: None   Collection Time: 02/21/16  1:04 AM  Result  Value Ref Range Status   Specimen Description WOUND  Final   Special Requests RIGHT DORSAL FOOT  Final   Gram Stain   Final    RARE WBC PRESENT, PREDOMINANTLY MONONUCLEAR RARE GRAM POSITIVE COCCI IN PAIRS    Culture   Final    ABUNDANT STAPHYLOCOCCUS AUREUS MODERATE STREPTOCOCCUS AGALACTIAE TESTING AGAINST S. AGALACTIAE NOT ROUTINELY PERFORMED DUE TO PREDICTABILITY OF AMP/PEN/VAN SUSCEPTIBILITY. MULTIPLE ORGANISMS PRESENT, NONE PREDOMINANT    Report Status 02/23/2016 FINAL  Final   Organism ID, Bacteria STAPHYLOCOCCUS AUREUS  Final      Susceptibility   Staphylococcus aureus - MIC*    CIPROFLOXACIN <=0.5 SENSITIVE Sensitive     ERYTHROMYCIN <=0.25 SENSITIVE Sensitive     GENTAMICIN <=0.5 SENSITIVE Sensitive     OXACILLIN 0.5 SENSITIVE Sensitive     TETRACYCLINE <=1 SENSITIVE Sensitive     VANCOMYCIN <=0.5 SENSITIVE Sensitive     TRIMETH/SULFA <=10 SENSITIVE Sensitive     CLINDAMYCIN <=0.25 SENSITIVE Sensitive     RIFAMPIN <=0.5 SENSITIVE Sensitive     Inducible Clindamycin NEGATIVE Sensitive     * ABUNDANT STAPHYLOCOCCUS AUREUS    Coagulation Studies:  Recent Labs  03/08/16 1049  LABPROT 14.1  INR 1.08    Imaging: Ct Head Code Stroke W/o Cm  Result Date: 03/08/2016 CLINICAL DATA:  Code stroke. Left-sided weakness beginning a acutely with fall. EXAM: CT HEAD WITHOUT CONTRAST TECHNIQUE: Contiguous axial images were obtained from the base of the skull through the vertex without intravenous contrast. COMPARISON:  MRI 12/23/2015.  CT 12/10/2015. FINDINGS: The brain has a normal appearance without evidence for hemorrhage, acute infarction, hydrocephalus, or mass lesion. There is no extra axial fluid collection. The skull  and paranasal sinuses are normal. ASPECTS ([place] Stroke Program Early CT Score) - Ganglionic level infarction (caudate, lentiform nuclei, internal capsule, insula, M1-M3 cortex): 7 - Supraganglionic infarction (M4-M6 cortex): 3 Total score (0-10 with 10 being normal): 10 IMPRESSION: 1. Normal head CT 2. ASPECTS is 10 These results were called by telephone at the time of interpretation on 03/08/2016 at 10:57 am to Dr. Norman Clay , who verbally acknowledged these results. Electronically Signed   By: Nelson Chimes M.D.   On: 03/08/2016 10:59        Assessment: 45 y.o. male with a hx of TIA's, DM and HLP presents today after feeling dizzy and falling, following the fall realized he had L arm and L leg weakness along with CP  CTH/CTA negative  MRI diffusion pending for possible TPA  Addendum- MRI negative for acute stroke  Stroke Risk Factors - diabetes mellitus, hyperlipidemia and hypertension   1. HgbA1c, fasting lipid panel 2. MRI, MRA  of the brain without contrast 3. PT consult, OT consult, Speech consult 4. Echocardiogram 5. Carotid dopplers 6. Prophylactic therapy-Antiplatelet med: Aspirin - dose 325 7. Risk factor modification 8. Telemetry monitoring 9. Frequent neuro checks 10 NPO until passes stroke swallow screen 11 please page stroke NP  Or  PA  Or MD from 8am -4 pm  as this patient from this time will be  followed by the stroke.   You can look them up on [email or web address]  Password [email or web address] 12. Will need CP workup, trend Troponis and EKG

## 2016-03-08 NOTE — ED Notes (Signed)
Pt returned from MRI °

## 2016-03-08 NOTE — ED Triage Notes (Signed)
Pt to ER BIB [facility] from home as activated code stroke after experiencing a fall this morning due to left sided weakness and dizziness, no LOC. On arrival patient is showing weakness to left side but also complaining of 6/10 substernal chest pain non-radiating. Pt received 324 mg aspirin in route as well as 3 nitro. Pt has known diabetic foot ulcer to right foot, re-injured foot this morning with fall. A/O x4.

## 2016-03-08 NOTE — H&P (Signed)
History and Physical    Shane [name] [id number] DOB: 1971/03/06 DOA: 03/08/2016  PCP: Minerva Ends, MD  Patient coming from:   Home  Chief Complaint:  Left sided weakness  HPI: Shane [name] is a 45 y.o. male with a medical history significant for, but not  limited to, diabetes, depression, hypertension, and chronic diastolic heart failure. He was PVD is s/p amputations of some of his left toes. He was hospitalized last month with cellulitis /abscess of right foot, s/p I+D. Patient uses walker at home for ambulation. This am after taking out the trash he lost his balance and fell. Patient states he hit his head on the cement and sometime after that noticed left sided weakness and chest pain. Chest pain worse with deep inspiration and cough. He received ASA and 3 NTG on way to ED. He had similar right sided weakness in March 2016 with negative workup. In October 2016 he had right sided weakness, again with negative workup.   ED Course:  Vitals stable on arrival to ED. BP has since been elevated, most recent reading 175/100.  CTA head / neck and brain MRI done and unrevealing.  Persistent left sided weakness.  Review of Systems: As per HPI, otherwise 10 point review of systems negative.    Past Medical History:  Diagnosis Date  . Anemia   . Bipolar disorder ([place])   . Chest pain    a. 2015 Reportedly normal stress test in [place].  Marland Kitchen Chronic diastolic CHF (congestive heart failure) (HCC)    a.03/2015 Echo: EF 55-60%, Gr 1 DD, mild MR, triv PR.  . Depression with anxiety   . GERD (gastroesophageal reflux disease)   . Hyperlipidemia   . Hypertension    a. 08/2014 Admitted with hypertensive urgency.  . Internal carotid artery stenosis   . Lower GI bleed 07/04/2015  . Refusal of blood transfusions as patient is Jehovah's Witness   . Sleep apnea   . TIA (transient ischemic attack) 08/2014; 03/2015   a. 08/2014 in setting of hypertensive urgency.  . Type II diabetes mellitus ([place])    started insulin spring 2016  . Vitamin D deficiency spring 2016    Past Surgical History:  Procedure Laterality Date  . AMPUTATION Left 08/03/2015   Procedure: AMPUTATION LEFT GREAT TOE;  Surgeon: Newt Minion, MD;  Location: [place];  Service: Orthopedics;  Laterality: Left;  . AMPUTATION Left 12/02/2015   Procedure: amputation of left 2nd digit  . APPLICATION OF WOUND VAC Left 12/19/2015   Procedure: APPLICATION OF WOUND VAC;  Surgeon: Meredith Pel, MD;  Location: [place];  Service: Orthopedics;  Laterality: Left;  . CIRCUMCISION    . COLONOSCOPY N/A 01/22/2016   Procedure: COLONOSCOPY;  Surgeon: Gatha Mayer, MD;  Location: [place];  Service: Endoscopy;  Laterality: N/A;  . ESOPHAGOGASTRODUODENOSCOPY N/A 01/19/2016   Procedure: ESOPHAGOGASTRODUODENOSCOPY (EGD);  Surgeon: Manus Gunning, MD;  Location: [place];  Service: Gastroenterology;  Laterality: N/A;  . ESOPHAGOGASTRODUODENOSCOPY N/A 01/22/2016   Procedure: ESOPHAGOGASTRODUODENOSCOPY (EGD);  Surgeon: Gatha Mayer, MD;  [facility];  Service: Endoscopy;  Laterality: N/A;  . I&D EXTREMITY Left 12/02/2015   Procedure: IRRIGATION AND DEBRIDEMENT OF FOOT; LEFT SECOND TOE AMPUTATION;  Surgeon: Meredith Pel, MD;  Location: [place];  Service: Orthopedics;  Laterality: Left;  . I&D EXTREMITY Left 12/19/2015   Procedure: I & D LEFT FOOT WITH BEADS ;  Surgeon: Meredith Pel, MD;  Location: [place];  Service: Orthopedics;  Laterality: Left;  . I&D EXTREMITY Right 01/17/2016   Procedure: IRRIGATION AND DEBRIDEMENT RIGHT FOOT;  Surgeon: Newt Minion, MD;  Location: [place];  Service: Orthopedics;  Laterality: Right;  . INCISION AND DRAINAGE FOOT Right 01/17/2016  . INGUINAL HERNIA REPAIR Bilateral ~ 1983- ~ 1986  . TONSILLECTOMY  ~ 61    Social History   Social History  . Marital status: Married    Spouse name: N/A  . Number of children: N/A  . Years of education: N/A   Occupational History  . Not on  file.   Social History Main Topics  . Smoking status: Never Smoker  . Smokeless tobacco: Never Used  . Alcohol use No  . Drug use: No  . Sexual activity: No   Other Topics Concern  . Not on file   Social History Narrative   Lives in [place] with wife.  Active but doesn't routinely exercise.   Lives at home with wife. Uses walker for ambulation Allergies  Allergen Reactions  . Other Other (See Comments)    Red meat causes stomach pains, bloating and diarrhea  . Milk-Related Compounds Diarrhea and Other (See Comments)    Any dairy products  - diarrhea and bloating    Family History  Problem Relation Age of Onset  . Hypertension Mother   . Diabetes Mother   . Hyperlipidemia Mother   . Heart disease Mother     s/p pacemaker  . Diabetes Father   . Hypertension Father   . Stroke Father   . Heart attack Father     first MI @ 41.  . Stroke Brother     Prior to Admission medications   Medication Sig Start Date End Date Taking? Authorizing Provider  amLODipine (NORVASC) 10 MG tablet Take 1 tablet (10 mg total) by mouth daily. 01/23/16  Yes Hosie Poisson, MD  aspirin EC 325 MG tablet Take 1 tablet (325 mg total) by mouth daily. 09/04/15  Yes Maren Reamer, MD  atorvastatin (LIPITOR) 80 MG tablet Take 1 tablet (80 mg total) by mouth every morning. 08/18/15  Yes Josalyn Funches, MD  carvedilol (COREG) 12.5 MG tablet TAKE 1 TABLET BY MOUTH 2 TIMES DAILY WITH A MEAL 01/30/16  Yes Josalyn Funches, MD  divalproex (DEPAKOTE) 500 MG DR tablet Take 2 tablets (1,000 mg total) by mouth at bedtime. 12/04/15  Yes Venetia Maxon Rama, MD  doxycycline (VIBRA-TABS) 100 MG tablet Take 1 tablet (100 mg total) by mouth 2 (two) times daily. 02/23/16  Yes Venetia Maxon Rama, MD  ferrous sulfate 325 (65 FE) MG tablet Take 1 tablet (325 mg total) by mouth 2 (two) times daily with a meal. Patient taking differently: Take 325 mg by mouth daily with breakfast.  08/18/15  Yes Josalyn Funches, MD  FLUoxetine (PROZAC) 10 MG  tablet Take 1 tablet (10 mg total) by mouth every morning. 01/02/16  Yes Oswald Hillock, MD  gabapentin (NEURONTIN) 100 MG capsule Take 2 capsules (200 mg total) by mouth 3 (three) times daily. 01/23/16  Yes Hosie Poisson, MD  glipiZIDE (GLIPIZIDE XL) 10 MG 24 hr tablet Take 1 tablet (10 mg total) by mouth daily with breakfast. 03/03/16  Yes Boykin Nearing, MD  HYDROcodone-acetaminophen (NORCO/VICODIN) 5-325 MG tablet Take 1 tablet by mouth every 6 (six) hours as needed for moderate pain. 01/23/16  Yes Hosie Poisson, MD  ibuprofen (ADVIL,MOTRIN) 800 MG tablet TAKE 1 TABLET BY MOUTH EVERY 8 HOURS AS NEEDED. Patient taking differently: TAKE 1 TABLET BY  MOUTH EVERY 8 HOURS AS NEEDED FOR PAIN 02/12/16  Yes Josalyn Funches, MD  Insulin Glargine (LANTUS SOLOSTAR) 100 UNIT/ML Solostar Pen Inject 10 Units into the skin daily. 03/03/16  Yes Josalyn Funches, MD  lisinopril (PRINIVIL,ZESTRIL) 40 MG tablet Take 1 tablet (40 mg total) by mouth daily. 08/18/15  Yes Boykin Nearing, MD  metFORMIN (GLUCOPHAGE) 1000 MG tablet Take 1 tablet (1,000 mg total) by mouth 2 (two) times daily with a meal. 12/22/15  Yes Ripudeep K Rai, MD  ondansetron (ZOFRAN ODT) 8 MG disintegrating tablet Take 1 tablet (8 mg total) by mouth every 8 (eight) hours as needed for nausea or vomiting. 02/26/16  Yes Josalyn Funches, MD  pantoprazole (PROTONIX) 40 MG tablet Take 1 tablet (40 mg total) by mouth daily. 02/26/16  Yes Josalyn Funches, MD  saccharomyces boulardii (FLORASTOR) 250 MG capsule Take 1 capsule (250 mg total) by mouth 2 (two) times daily. 01/15/16  Yes Janece Canterbury, MD  traZODone (DESYREL) 100 MG tablet Take 1 tablet (100 mg total) by mouth at bedtime. 02/26/16  Yes Josalyn Funches, MD  Vitamin D, Ergocalciferol, (DRISDOL) 50000 units CAPS capsule Take 1 capsule (50,000 Units total) by mouth every 30 (thirty) days. Patient taking differently: Take 50,000 Units by mouth every 30 (thirty) days. Every 30 days 03/04/16  Yes Josalyn Funches, MD    glucose blood (TRUE METRIX BLOOD GLUCOSE TEST) test strip Use as instructed 12/29/15   Josalyn Funches, MD  Insulin Pen Needle (B-D ULTRAFINE III SHORT PEN) 31G X 8 MM MISC 1 application by Does not apply route daily. 03/03/16   Josalyn Funches, MD  silver sulfADIAZINE (SILVADENE) 1 % cream Apply topically daily. Patient not taking: Reported on 03/08/2016 02/23/16   Venetia Maxon Rama, MD  TRUEPLUS LANCETS 28G MISC Use as directed Patient not taking: Reported on 03/08/2016 12/29/15   Boykin Nearing, MD    Physical Exam: Vitals:   03/08/16 1545 03/08/16 1615 03/08/16 1645 03/08/16 1700  BP: (!) 181/104 (!) 188/103 (!) 188/103 (!) 175/110  Pulse: 82 82 84 83  Resp: 18 21 12 14   SpO2: 100% 100% 100% 100%  Weight:      Height:        Constitutional:  NAD, calm, comfortable Vitals:   03/08/16 1545 03/08/16 1615 03/08/16 1645 03/08/16 1700  BP: (!) 181/104 (!) 188/103 (!) 188/103 (!) 175/110  Pulse: 82 82 84 83  Resp: 18 21 12 14   SpO2: 100% 100% 100% 100%  Weight:      Height:       Eyes: PER, lids and conjunctivae normal ENMT: Mucous membranes are moist. Posterior pharynx clear of any exudate or lesions..  Neck: normal, supple, no masses Respiratory: clear to auscultation bilaterally, no wheezing, no crackles. Normal respiratory effort. No accessory muscle use.  Cardiovascular: Regular rate and rhythm, no murmurs / rubs / gallops. No extremity edema. 2+ dorsal pedis pulses.   Abdomen: no tenderness, no masses palpated. No hepatomegaly. Bowel sounds positive.  Musculoskeletal: no clubbing / cyanosis. No joint deformity upper and lower extremities. Good ROM, no contractures. Normal muscle tone.  Skin: no rashes, lesions, ulcers.  Neurologic: CN 2-12 grossly intact. Sensation intact, Strength 5/5 in all 4.  Psychiatric: Normal judgment and insight. Alert and oriented x 3. Normal mood.    Labs on Admission: I have personally reviewed following labs and imaging studies  Urine analysis:     Component Value Date/Time   COLORURINE YELLOW 03/08/2016 [place] 03/08/2016 1232  LABSPEC 1.040 (H) 03/08/2016 1232   PHURINE 6.5 03/08/2016 1232   GLUCOSEU 100 (A) 03/08/2016 1232   HGBUR TRACE (A) 03/08/2016 [place] 03/08/2016 1232   KETONESUR NEGATIVE 03/08/2016 1232   PROTEINUR 100 (A) 03/08/2016 1232   UROBILINOGEN 0.2 04/24/2015 1056   NITRITE NEGATIVE 03/08/2016 1232   LEUKOCYTESUR NEGATIVE 03/08/2016 1232    Radiological Exams on Admission: Ct Angio Head W Or Wo Contrast  Result Date: 03/08/2016 CLINICAL DATA:  Acute presentation with left-sided weakness. EXAM: CT ANGIOGRAPHY HEAD AND NECK TECHNIQUE: Multidetector CT imaging of the head and neck was performed using the standard protocol during bolus administration of intravenous contrast. Multiplanar CT image reconstructions and MIPs were obtained to evaluate the vascular anatomy. Carotid stenosis measurements (when applicable) are obtained utilizing NASCET criteria, using the distal internal carotid diameter as the denominator. CONTRAST:  50 cc Isovue 370 COMPARISON:  CT earlier same day FINDINGS: CTA NECK Aortic arch: Normal.  No atherosclerosis, aneurysm or dissection. Right carotid system: Common carotid artery widely patent to the bifurcation. No carotid bifurcation atherosclerosis, narrowing or irregularity. Cervical internal carotid artery is normal. Left carotid system: Common carotid artery widely patent to the bifurcation. No carotid bifurcation atherosclerosis, narrowing or irregularity. Cervical internal carotid artery is normal. Vertebral arteries:Normal Skeleton: Normal Other neck: No soft tissue lesion Upper chest: Poor inspiration.  No focal finding. CTA HEAD Anterior circulation: Both internal carotid arteries are widely patent through the skullbase and siphon region. There is some atherosclerotic calcification of the carotid siphon with narrowing of the proximal siphon on the left  of 30%. The anterior and middle cerebral vessels are patent without proximal stenosis, aneurysm or vascular malformation. No evidence of embolic occlusion. Posterior circulation: Both vertebral arteries widely patent to the basilar. No basilar stenosis. Posterior circulation branch vessels are normal. Venous sinuses: Patent and normal. Anatomic variants: None significant Delayed phase: No abnormal enhancement IMPRESSION: No atherosclerotic disease in the neck. No intracranial large vessel occlusion. Atherosclerotic change in the carotid siphon regions with 30% stenosis in the proximal siphon on the left. These results were called by telephone at the time of interpretation on 03/08/2016 at 11:15 am to Dr. Norman Clay , who verbally acknowledged these results. Electronically Signed   By: Nelson Chimes M.D.   On: 03/08/2016 11:17   Ct Angio Neck W Or Wo Contrast  Result Date: 03/08/2016 CLINICAL DATA:  Acute presentation with left-sided weakness. EXAM: CT ANGIOGRAPHY HEAD AND NECK TECHNIQUE: Multidetector CT imaging of the head and neck was performed using the standard protocol during bolus administration of intravenous contrast. Multiplanar CT image reconstructions and MIPs were obtained to evaluate the vascular anatomy. Carotid stenosis measurements (when applicable) are obtained utilizing NASCET criteria, using the distal internal carotid diameter as the denominator. CONTRAST:  50 cc Isovue 370 COMPARISON:  CT earlier same day FINDINGS: CTA NECK Aortic arch: Normal.  No atherosclerosis, aneurysm or dissection. Right carotid system: Common carotid artery widely patent to the bifurcation. No carotid bifurcation atherosclerosis, narrowing or irregularity. Cervical internal carotid artery is normal. Left carotid system: Common carotid artery widely patent to the bifurcation. No carotid bifurcation atherosclerosis, narrowing or irregularity. Cervical internal carotid artery is normal. Vertebral arteries:Normal  Skeleton: Normal Other neck: No soft tissue lesion Upper chest: Poor inspiration.  No focal finding. CTA HEAD Anterior circulation: Both internal carotid arteries are widely patent through the skullbase and siphon region. There is some atherosclerotic calcification of the carotid siphon with narrowing of the proximal siphon  on the left of 30%. The anterior and middle cerebral vessels are patent without proximal stenosis, aneurysm or vascular malformation. No evidence of embolic occlusion. Posterior circulation: Both vertebral arteries widely patent to the basilar. No basilar stenosis. Posterior circulation branch vessels are normal. Venous sinuses: Patent and normal. Anatomic variants: None significant Delayed phase: No abnormal enhancement IMPRESSION: No atherosclerotic disease in the neck. No intracranial large vessel occlusion. Atherosclerotic change in the carotid siphon regions with 30% stenosis in the proximal siphon on the left. These results were called by telephone at the time of interpretation on 03/08/2016 at 11:15 am to Dr. Norman Clay , who verbally acknowledged these results. Electronically Signed   By: Nelson Chimes M.D.   On: 03/08/2016 11:17   Mr Brain Wo Contrast  Result Date: 03/08/2016 CLINICAL DATA:  Dizziness and fall. Left arm and leg weakness with chest pain. EXAM: MRI HEAD WITHOUT CONTRAST TECHNIQUE: Multiplanar, multiecho pulse sequences of the brain and surrounding structures were obtained without intravenous contrast. COMPARISON:  12/23/2015 FINDINGS: Brain: No acute infarction, hemorrhage, hydrocephalus, extra-axial collection or mass lesion. No white matter disease or atrophy. Chronic single focus of susceptibility in the left frontal pole which is incidental in isolation. Vascular: Normal flow voids. Skull and upper cervical spine: Normal marrow signal. Sinuses/Orbits: Negative. Other: None. IMPRESSION: Stable, negative exam.  No explanation for weakness. Electronically Signed    By: Monte Fantasia M.D.   On: 03/08/2016 12:19   Ct Head Code Stroke W/o Cm  Result Date: 03/08/2016 CLINICAL DATA:  Code stroke. Left-sided weakness beginning a acutely with fall. EXAM: CT HEAD WITHOUT CONTRAST TECHNIQUE: Contiguous axial images were obtained from the base of the skull through the vertex without intravenous contrast. COMPARISON:  MRI 12/23/2015.  CT 12/10/2015. FINDINGS: The brain has a normal appearance without evidence for hemorrhage, acute infarction, hydrocephalus, or mass lesion. There is no extra axial fluid collection. The skull and paranasal sinuses are normal. ASPECTS ([place] Stroke Program Early CT Score) - Ganglionic level infarction (caudate, lentiform nuclei, internal capsule, insula, M1-M3 cortex): 7 - Supraganglionic infarction (M4-M6 cortex): 3 Total score (0-10 with 10 being normal): 10 IMPRESSION: 1. Normal head CT 2. ASPECTS is 10 These results were called by telephone at the time of interpretation on 03/08/2016 at 10:57 am to Dr. Norman Clay , who verbally acknowledged these results. Electronically Signed   By: Nelson Chimes M.D.   On: 03/08/2016 10:59    EKG: Independently reviewed.   EKG Interpretation  Date/Time:  Friday March 08 2016 11:08:44 EDT Ventricular Rate:  88 PR Interval:    QRS Duration: 89 QT Interval:  352 QTC Calculation: 426 R Axis:   61 Text Interpretation:  Sinus rhythm Left ventricular hypertrophy Inferior infarct, age indeterminate Confirmed by KOHUT  MD, [place] [phone]) on 03/08/2016 11:58:48 AM      Carotid U/S June 2017 Summary:   RT ICA/CCA ratio: 0.85  LT ICA/CCA ratio: 0.88.  - No significant extracranial carotid artery stenosis demonstrated. Vertebrals are patent with antegrade flow. - 1-39% stenosis bilaterally.    Assessment/Plan   Active Problems:   Left-sided weakness   Essential hypertension   Depression with anxiety   Diabetes mellitus type 2, uncontrolled, with complications (HCC)    Cellulitis of foot   Chronic diastolic CHF (congestive heart failure), NYHA class 1 (HCC)      Left sided weakness / dizziness. Code stroke activated by EMS but head head CT and MRI without acute findings. UDS negative. Similar episodes of  weakness last year, ? TIA. Takes daily ASA  Of note, patient has had five negative brain MRIs over last 1.5 years for neurologic symptoms -admit to tele OBS -appreciate Neurology's assistance -No need for carotid u/s. CTA head and neck today negative for significant disease -follow up on A1c, lipids -PT, OT evaluation  Chest pain, intermittent and non-exertional. .Heart Score 2. EKG ----> Sinus rhythm Left ventricular hypertrophy, Inferior infarct, age indeterminate. Lateral leads are also involved -trop negative x2, will trend -monitor on tele  Right foot cellulitis, s/p I+D last month. Followed outpatient by Dr. Sharol Given  -continue home Vibramycin  Hx of grade 2 diastolic heart failure with preserved EF on echo June 2017. Most recent echo was August 2017 but study wasn't technically sufficient to evaluate for LV diastolic dysfunction. No evidence for decompensation.at present -Continue coreg -Daily weights -intake and output.  HTN. BP elevated in ED over last few hours.  -Ruled out for MRI so will continue home prinivil and  Norvasc.  -add prn hydralazine  DM2 with peripheral neuropathy -hold home oral diabetic meds -resume home lantus in am -add SSI -A1c -continue neurotin   Depression / bipolor disorder  . -continue home prozac, depakote -obtain depakote level  DVT prophylaxis:   Lovenox   Code Status:     Full code   Family Communication:    none  Disposition Plan:   Discharge in  24-48 hours hours              Consults called:  Neurology Admission status:   Observation - Telemetry     Tye Savoy NP [facility] Pager [phone]  If 7PM-7AM, please contact night-coverage [email or web address] Password [email or web address]  03/08/2016, 5:10  PM

## 2016-03-08 NOTE — ED Notes (Signed)
Pt to transport to MRI with this RN and stroke team

## 2016-03-08 NOTE — ED Notes (Signed)
CBG 134 

## 2016-03-08 NOTE — ED Provider Notes (Signed)
[facility] DEPT Provider Note   CSN: [id number] Arrival date & time: 03/08/16  1045   An emergency department physician performed an initial assessment on this suspected stroke patient at 1045.  History   Chief Complaint Chief Complaint  Patient presents with  . Code Stroke  . Chest Pain    HPI Shane [name] is a 45 y.o. male.  HPI   45 year old male presenting after a fall. Happened just before arrival. Patient was ambulating with the use of assistive device which is his baseline. He has baseline unsteadiness. He began feeling dizzy and fell. He does not think he completely lost consciousness. Upon getting up he noticed left arm and left leg weakness as well as some chest pain. He thinks the chest pain began after a fall but is not completely sure. He describes a sharp pain near the center of his chest. Has waxed and waned since onset. Last a few minutes without appreciable exacerbating relieving factors. His left-sided weakness has been stable since he first noticed it. He also noticed some bleeding from his right foot after the fall. He had surgery there about 1 month ago by Dr Sharol Given. .  Past Medical History:  Diagnosis Date  . Anemia   . Bipolar disorder ([place])   . Chest pain    a. 2015 Reportedly normal stress test in [place].  Marland Kitchen Chronic diastolic CHF (congestive heart failure) (HCC)    a.03/2015 Echo: EF 55-60%, Gr 1 DD, mild MR, triv PR.  . Depression with anxiety   . GERD (gastroesophageal reflux disease)   . Hyperlipidemia   . Hypertension    a. 08/2014 Admitted with hypertensive urgency.  . Internal carotid artery stenosis   . Lower GI bleed 07/04/2015  . Refusal of blood transfusions as patient is Jehovah's Witness   . Sleep apnea   . TIA (transient ischemic attack) 08/2014; 03/2015   a. 08/2014 in setting of hypertensive urgency.  . Type II diabetes mellitus ([place])    started insulin spring 2016  . Vitamin D deficiency spring 2016    Patient Active Problem List   Diagnosis Date Noted  . Dizziness 03/03/2016  . Diabetic ulcer of right foot associated with type 2 diabetes mellitus, with necrosis of muscle ([place]) 02/21/2016  . Surgical wound dehiscence 02/21/2016  . Anemia, iron deficiency   . Swelling of foot joint   . Left foot infection 12/30/2015  . Chronic diastolic CHF (congestive heart failure), NYHA class 1 ([place]) 12/30/2015  . Cellulitis of left foot   . Diabetes mellitus type 2, uncontrolled, with complications ([place]) [id number]  . Depression with anxiety 12/01/2015  . Pain in the chest 12/01/2015  . Left foot pain 11/24/2015  . Insomnia 11/21/2015  . GERD (gastroesophageal reflux disease) 08/18/2015  . Amputated toe of left foot ([place]) 08/18/2015  . Erectile dysfunction 07/04/2015  . AKI (acute kidney injury) ([place]) 04/24/2015  . Sensory disturbance 04/02/2015  . Essential hypertension 04/02/2015  . Hemispheric carotid artery syndrome   . HLD (hyperlipidemia)   . Headache     Past Surgical History:  Procedure Laterality Date  . AMPUTATION Left 08/03/2015   Procedure: AMPUTATION LEFT GREAT TOE;  Surgeon: Newt Minion, MD;  Location: [facility];  Service: Orthopedics;  Laterality: Left;  . AMPUTATION Left 12/02/2015   Procedure: amputation of left 2nd digit  . APPLICATION OF WOUND VAC Left 12/19/2015   Procedure: APPLICATION OF WOUND VAC;  Surgeon: Meredith Pel, MD;  Location: [facility];  Service: Orthopedics;  Laterality: Left;  . CIRCUMCISION    . COLONOSCOPY N/A 01/22/2016   Procedure: COLONOSCOPY;  Surgeon: Gatha Mayer, MD;  Location: [facility];  Service: Endoscopy;  Laterality: N/A;  . ESOPHAGOGASTRODUODENOSCOPY N/A 01/19/2016   Procedure: ESOPHAGOGASTRODUODENOSCOPY (EGD);  Surgeon: Manus Gunning, MD;  Location: [facility];  Service: Gastroenterology;  Laterality: N/A;  . ESOPHAGOGASTRODUODENOSCOPY N/A 01/22/2016   Procedure: ESOPHAGOGASTRODUODENOSCOPY (EGD);  Surgeon: Gatha Mayer, MD;  Location[facility] ENDOSCOPY;  Service:  Endoscopy;  Laterality: N/A;  . I&D EXTREMITY Left 12/02/2015   Procedure: IRRIGATION AND DEBRIDEMENT OF FOOT; LEFT SECOND TOE AMPUTATION;  Surgeon: Meredith Pel, MD;  Location: [facility];  Service: Orthopedics;  Laterality: Left;  . I&D EXTREMITY Left 12/19/2015   Procedure: I & D LEFT FOOT WITH BEADS ;  Surgeon: Meredith Pel, MD;  Location: [facility];  Service: Orthopedics;  Laterality: Left;  . I&D EXTREMITY Right 01/17/2016   Procedure: IRRIGATION AND DEBRIDEMENT RIGHT FOOT;  Surgeon: Newt Minion, MD;  Location: [facility];  Service: Orthopedics;  Laterality: Right;  . INCISION AND DRAINAGE FOOT Right 01/17/2016  . INGUINAL HERNIA REPAIR Bilateral ~ 1983- ~ 1986  . TONSILLECTOMY  ~ 1985       Home Medications    Prior to Admission medications   Medication Sig Start Date End Date Taking? Authorizing Provider  amLODipine (NORVASC) 10 MG tablet Take 1 tablet (10 mg total) by mouth daily. 01/23/16  Yes Hosie Poisson, MD  aspirin EC 325 MG tablet Take 1 tablet (325 mg total) by mouth daily. 09/04/15  Yes Maren Reamer, MD  atorvastatin (LIPITOR) 80 MG tablet Take 1 tablet (80 mg total) by mouth every morning. 08/18/15  Yes Josalyn Funches, MD  carvedilol (COREG) 12.5 MG tablet TAKE 1 TABLET BY MOUTH 2 TIMES DAILY WITH A MEAL 01/30/16  Yes Josalyn Funches, MD  divalproex (DEPAKOTE) 500 MG DR tablet Take 2 tablets (1,000 mg total) by mouth at bedtime. 12/04/15  Yes Venetia Maxon Rama, MD  doxycycline (VIBRA-TABS) 100 MG tablet Take 1 tablet (100 mg total) by mouth 2 (two) times daily. 02/23/16  Yes Venetia Maxon Rama, MD  ferrous sulfate 325 (65 FE) MG tablet Take 1 tablet (325 mg total) by mouth 2 (two) times daily with a meal. Patient taking differently: Take 325 mg by mouth daily with breakfast.  08/18/15  Yes Josalyn Funches, MD  FLUoxetine (PROZAC) 10 MG tablet Take 1 tablet (10 mg total) by mouth every morning. 01/02/16  Yes Oswald Hillock, MD  gabapentin (NEURONTIN) 100 MG capsule Take 2 capsules (200 mg  total) by mouth 3 (three) times daily. 01/23/16  Yes Hosie Poisson, MD  glipiZIDE (GLIPIZIDE XL) 10 MG 24 hr tablet Take 1 tablet (10 mg total) by mouth daily with breakfast. 03/03/16  Yes Boykin Nearing, MD  HYDROcodone-acetaminophen (NORCO/VICODIN) 5-325 MG tablet Take 1 tablet by mouth every 6 (six) hours as needed for moderate pain. 01/23/16  Yes Hosie Poisson, MD  ibuprofen (ADVIL,MOTRIN) 800 MG tablet TAKE 1 TABLET BY MOUTH EVERY 8 HOURS AS NEEDED. Patient taking differently: TAKE 1 TABLET BY MOUTH EVERY 8 HOURS AS NEEDED FOR PAIN 02/12/16  Yes Josalyn Funches, MD  Insulin Glargine (LANTUS SOLOSTAR) 100 UNIT/ML Solostar Pen Inject 10 Units into the skin daily. 03/03/16  Yes Josalyn Funches, MD  lisinopril (PRINIVIL,ZESTRIL) 40 MG tablet Take 1 tablet (40 mg total) by mouth daily. 08/18/15  Yes Josalyn Funches, MD  metFORMIN (GLUCOPHAGE) 1000 MG tablet Take 1 tablet (1,000  mg total) by mouth 2 (two) times daily with a meal. 12/22/15  Yes Ripudeep K Rai, MD  ondansetron (ZOFRAN ODT) 8 MG disintegrating tablet Take 1 tablet (8 mg total) by mouth every 8 (eight) hours as needed for nausea or vomiting. 02/26/16  Yes Josalyn Funches, MD  pantoprazole (PROTONIX) 40 MG tablet Take 1 tablet (40 mg total) by mouth daily. 02/26/16  Yes Josalyn Funches, MD  saccharomyces boulardii (FLORASTOR) 250 MG capsule Take 1 capsule (250 mg total) by mouth 2 (two) times daily. 01/15/16  Yes Janece Canterbury, MD  traZODone (DESYREL) 100 MG tablet Take 1 tablet (100 mg total) by mouth at bedtime. 02/26/16  Yes Josalyn Funches, MD  Vitamin D, Ergocalciferol, (DRISDOL) 50000 units CAPS capsule Take 1 capsule (50,000 Units total) by mouth every 30 (thirty) days. Patient taking differently: Take 50,000 Units by mouth every 30 (thirty) days. Every 30 days 03/04/16  Yes Josalyn Funches, MD  glucose blood (TRUE METRIX BLOOD GLUCOSE TEST) test strip Use as instructed 12/29/15   Josalyn Funches, MD  Insulin Pen Needle (B-D ULTRAFINE III SHORT PEN)  31G X 8 MM MISC 1 application by Does not apply route daily. 03/03/16   Josalyn Funches, MD  silver sulfADIAZINE (SILVADENE) 1 % cream Apply topically daily. Patient not taking: Reported on 03/08/2016 02/23/16   Venetia Maxon Rama, MD  TRUEPLUS LANCETS 28G MISC Use as directed Patient not taking: Reported on 03/08/2016 12/29/15   Boykin Nearing, MD    Family History Family History  Problem Relation Age of Onset  . Hypertension Mother   . Diabetes Mother   . Hyperlipidemia Mother   . Heart disease Mother     s/p pacemaker  . Diabetes Father   . Hypertension Father   . Stroke Father   . Heart attack Father     first MI @ 41.  . Stroke Brother     Social History Social History  Substance Use Topics  . Smoking status: Never Smoker  . Smokeless tobacco: Never Used  . Alcohol use No     Allergies   Other and Milk-related compounds   Review of Systems Review of Systems  All systems reviewed and negative, other than as noted in HPI.   Physical Exam Updated Vital Signs BP 172/99   Pulse 87   Resp 22   Ht 5\' 8"  (1.727 m)   Wt 187 lb 13.3 oz (85.2 kg)   SpO2 100%   BMI 28.56 kg/m   Physical Exam  Constitutional: He appears well-developed and well-nourished. No distress.  HENT:  Head: Normocephalic and atraumatic.  Eyes: Conjunctivae are normal. Right eye exhibits no discharge. Left eye exhibits no discharge.  Neck: Neck supple.  Cardiovascular: Normal rate, regular rhythm and normal heart sounds.  Exam reveals no gallop and no friction rub.   No murmur heard. Pulmonary/Chest: Effort normal and breath sounds normal. No respiratory distress.  Abdominal: Soft. He exhibits no distension. There is no tenderness.  Musculoskeletal: He exhibits no edema or tenderness.  Wound to the dorsal aspect of the right foot with minimal bleeding.  Neurological: He is alert.  Speech is clear. Content appropriate. Follows commands. Cranial nerves II through XII are intact. Strength is 5  out of 5 right upper and right lower extremity. 2 out of 5 left upper and left lower extremity. Sensation is intact to light touch.  Skin: Skin is warm and dry.  Psychiatric: He has a normal mood and affect. His behavior is normal. Thought content  normal.  Nursing note and vitals reviewed.    ED Treatments / Results  Labs (all labs ordered are listed, but only abnormal results are displayed) Labs Reviewed  CBC - Abnormal; Notable for the following:       Result Value   RBC 3.35 (*)    Hemoglobin 10.0 (*)    HCT 31.8 (*)    All other components within normal limits  COMPREHENSIVE METABOLIC PANEL - Abnormal; Notable for the following:    Glucose, Bld 224 (*)    BUN 22 (*)    Albumin 3.3 (*)    ALT 12 (*)    Total Bilirubin 0.2 (*)    All other components within normal limits  URINALYSIS, ROUTINE W REFLEX MICROSCOPIC (NOT AT [facility]) - Abnormal; Notable for the following:    Specific Gravity, Urine 1.040 (*)    Glucose, UA 100 (*)    Hgb urine dipstick TRACE (*)    Protein, ur 100 (*)    All other components within normal limits  URINE MICROSCOPIC-ADD ON - Abnormal; Notable for the following:    Bacteria, UA RARE (*)    Casts HYALINE CASTS (*)    All other components within normal limits  CBG MONITORING, ED - Abnormal; Notable for the following:    Glucose-Capillary 214 (*)    All other components within normal limits  I-STAT CHEM 8, ED - Abnormal; Notable for the following:    BUN 25 (*)    Glucose, Bld 222 (*)    Calcium, Ion 1.14 (*)    Hemoglobin 10.9 (*)    HCT 32.0 (*)    All other components within normal limits  ETHANOL  PROTIME-INR  APTT  DIFFERENTIAL  URINE RAPID DRUG SCREEN, HOSP PERFORMED  I-STAT TROPOININ, ED    EKG  EKG Interpretation  Date/Time:  Friday March 08 2016 11:08:44 EDT Ventricular Rate:  88 PR Interval:    QRS Duration: 89 QT Interval:  352 QTC Calculation: 426 R Axis:   61 Text Interpretation:  Sinus rhythm Left ventricular  hypertrophy Inferior infarct, age indeterminate Confirmed by Henchy Mccauley  MD, Isaiha Asare [phone]) on 03/08/2016 11:58:48 AM       Radiology Ct Angio Head W Or Wo Contrast  Result Date: 03/08/2016 CLINICAL DATA:  Acute presentation with left-sided weakness. EXAM: CT ANGIOGRAPHY HEAD AND NECK TECHNIQUE: Multidetector CT imaging of the head and neck was performed using the standard protocol during bolus administration of intravenous contrast. Multiplanar CT image reconstructions and MIPs were obtained to evaluate the vascular anatomy. Carotid stenosis measurements (when applicable) are obtained utilizing NASCET criteria, using the distal internal carotid diameter as the denominator. CONTRAST:  50 cc Isovue 370 COMPARISON:  CT earlier same day FINDINGS: CTA NECK Aortic arch: Normal.  No atherosclerosis, aneurysm or dissection. Right carotid system: Common carotid artery widely patent to the bifurcation. No carotid bifurcation atherosclerosis, narrowing or irregularity. Cervical internal carotid artery is normal. Left carotid system: Common carotid artery widely patent to the bifurcation. No carotid bifurcation atherosclerosis, narrowing or irregularity. Cervical internal carotid artery is normal. Vertebral arteries:Normal Skeleton: Normal Other neck: No soft tissue lesion Upper chest: Poor inspiration.  No focal finding. CTA HEAD Anterior circulation: Both internal carotid arteries are widely patent through the skullbase and siphon region. There is some atherosclerotic calcification of the carotid siphon with narrowing of the proximal siphon on the left of 30%. The anterior and middle cerebral vessels are patent without proximal stenosis, aneurysm or vascular malformation. No evidence of embolic  occlusion. Posterior circulation: Both vertebral arteries widely patent to the basilar. No basilar stenosis. Posterior circulation branch vessels are normal. Venous sinuses: Patent and normal. Anatomic variants: None significant  Delayed phase: No abnormal enhancement IMPRESSION: No atherosclerotic disease in the neck. No intracranial large vessel occlusion. Atherosclerotic change in the carotid siphon regions with 30% stenosis in the proximal siphon on the left. These results were called by telephone at the time of interpretation on 03/08/2016 at 11:15 am to Dr. Norman Clay , who verbally acknowledged these results. Electronically Signed   By: Nelson Chimes M.D.   On: 03/08/2016 11:17   Ct Angio Neck W Or Wo Contrast  Result Date: 03/08/2016 CLINICAL DATA:  Acute presentation with left-sided weakness. EXAM: CT ANGIOGRAPHY HEAD AND NECK TECHNIQUE: Multidetector CT imaging of the head and neck was performed using the standard protocol during bolus administration of intravenous contrast. Multiplanar CT image reconstructions and MIPs were obtained to evaluate the vascular anatomy. Carotid stenosis measurements (when applicable) are obtained utilizing NASCET criteria, using the distal internal carotid diameter as the denominator. CONTRAST:  50 cc Isovue 370 COMPARISON:  CT earlier same day FINDINGS: CTA NECK Aortic arch: Normal.  No atherosclerosis, aneurysm or dissection. Right carotid system: Common carotid artery widely patent to the bifurcation. No carotid bifurcation atherosclerosis, narrowing or irregularity. Cervical internal carotid artery is normal. Left carotid system: Common carotid artery widely patent to the bifurcation. No carotid bifurcation atherosclerosis, narrowing or irregularity. Cervical internal carotid artery is normal. Vertebral arteries:Normal Skeleton: Normal Other neck: No soft tissue lesion Upper chest: Poor inspiration.  No focal finding. CTA HEAD Anterior circulation: Both internal carotid arteries are widely patent through the skullbase and siphon region. There is some atherosclerotic calcification of the carotid siphon with narrowing of the proximal siphon on the left of 30%. The anterior and middle  cerebral vessels are patent without proximal stenosis, aneurysm or vascular malformation. No evidence of embolic occlusion. Posterior circulation: Both vertebral arteries widely patent to the basilar. No basilar stenosis. Posterior circulation branch vessels are normal. Venous sinuses: Patent and normal. Anatomic variants: None significant Delayed phase: No abnormal enhancement IMPRESSION: No atherosclerotic disease in the neck. No intracranial large vessel occlusion. Atherosclerotic change in the carotid siphon regions with 30% stenosis in the proximal siphon on the left. These results were called by telephone at the time of interpretation on 03/08/2016 at 11:15 am to Dr. Norman Clay , who verbally acknowledged these results. Electronically Signed   By: Nelson Chimes M.D.   On: 03/08/2016 11:17   Mr Brain Wo Contrast  Result Date: 03/08/2016 CLINICAL DATA:  Dizziness and fall. Left arm and leg weakness with chest pain. EXAM: MRI HEAD WITHOUT CONTRAST TECHNIQUE: Multiplanar, multiecho pulse sequences of the brain and surrounding structures were obtained without intravenous contrast. COMPARISON:  12/23/2015 FINDINGS: Brain: No acute infarction, hemorrhage, hydrocephalus, extra-axial collection or mass lesion. No white matter disease or atrophy. Chronic single focus of susceptibility in the left frontal pole which is incidental in isolation. Vascular: Normal flow voids. Skull and upper cervical spine: Normal marrow signal. Sinuses/Orbits: Negative. Other: None. IMPRESSION: Stable, negative exam.  No explanation for weakness. Electronically Signed   By: Monte Fantasia M.D.   On: 03/08/2016 12:19   Ct Head Code Stroke W/o Cm  Result Date: 03/08/2016 CLINICAL DATA:  Code stroke. Left-sided weakness beginning a acutely with fall. EXAM: CT HEAD WITHOUT CONTRAST TECHNIQUE: Contiguous axial images were obtained from the base of the skull through the vertex without intravenous  contrast. COMPARISON:  MRI  12/23/2015.  CT 12/10/2015. FINDINGS: The brain has a normal appearance without evidence for hemorrhage, acute infarction, hydrocephalus, or mass lesion. There is no extra axial fluid collection. The skull and paranasal sinuses are normal. ASPECTS ([place] Stroke Program Early CT Score) - Ganglionic level infarction (caudate, lentiform nuclei, internal capsule, insula, M1-M3 cortex): 7 - Supraganglionic infarction (M4-M6 cortex): 3 Total score (0-10 with 10 being normal): 10 IMPRESSION: 1. Normal head CT 2. ASPECTS is 10 These results were called by telephone at the time of interpretation on 03/08/2016 at 10:57 am to Dr. Norman Clay , who verbally acknowledged these results. Electronically Signed   By: Nelson Chimes M.D.   On: 03/08/2016 10:59    Procedures Procedures (including critical care time)  Medications Ordered in ED Medications  aspirin chewable tablet 324 mg (324 mg Oral Not Given 03/08/16 1509)  iopamidol (ISOVUE-370) 76 % injection (50 mLs  Contrast Given 03/08/16 1057)     Initial Impression / Assessment and Plan / ED Course  I have reviewed the triage vital signs and the nursing notes.  Pertinent labs & imaging results that were available during my care of the patient were reviewed by me and considered in my medical decision making (see chart for details).  Clinical Course    45 year old male presenting after fall. Sounds like he had near syncope. When he woke up though he noticed some left-sided weakness as well as chest pain. He was made a code stroke and evaluated by neurology. Imaging has been negative for acute stroke but neurology recommending admission for further workup. His chest pain seems atypical for ACS although his EKG does have some changes inferiorly which appears new. Initial troponin is normal. He will be given aspirin for his possible TIA symptoms as well as his chest pain. Additionally he had some bleeding to wound to his right foot. This controlled. No overt  infection. This should be fine with local wound care at this time.   Final Clinical Impressions(s) / ED Diagnoses   Final diagnoses:  Left-sided weakness  Chest pain, unspecified chest pain type    New Prescriptions New Prescriptions   No medications on file     Virgel Manifold, MD 03/08/16 1520

## 2016-03-08 NOTE — Code Documentation (Signed)
45yo male arriving to [facility] via [place] at 23.  Patient was at home and went to take the trash out at 0900.  Patient reports dizziness and sustained a fall.  Patient went in the house and took a shower and then developed left sided weakness.  EMS was called and assessed left facial droop and left sided weakness and activated a code stroke.  Stroke team at the bedside on patient arrival.  Labs drawn and patient cleared for CT by Dr. Wilson Singer.  Patient to CT with team.  CT completed.  CTA head and neck ordered and completed.  NIHSS 6, see documentation for details and code stroke times.  Patient with inconsistent exam which appears effort dependent, however, left arm unable to sustain gravity and patient does not lift left leg off the bed.  Patient reports decreased sensation on the left side.  Dr. Cristobal Goldmann at the bedside.  STAT MRI DWI ordered and patient transported to MRI with team.  MRI negative for acute infarct per MD.  No acute stroke treatment at this time.  Code stroke canceled. Handoff with ED RN Danelle Berry.

## 2016-03-09 DIAGNOSIS — L03119 Cellulitis of unspecified part of limb: Secondary | ICD-10-CM | POA: Diagnosis not present

## 2016-03-09 DIAGNOSIS — M6289 Other specified disorders of muscle: Secondary | ICD-10-CM | POA: Diagnosis not present

## 2016-03-09 LAB — TROPONIN I

## 2016-03-09 LAB — LIPID PANEL
CHOL/HDL RATIO: 3.3 ratio
CHOLESTEROL: 101 mg/dL (ref 0–200)
HDL: 31 mg/dL — ABNORMAL LOW (ref >40)
LDL Cholesterol: 54 mg/dL (ref 0–99)
TRIGLYCERIDES: 79 mg/dL (ref <150)
VLDL: 16 mg/dL (ref 0–40)

## 2016-03-09 LAB — GLUCOSE, CAPILLARY
GLUCOSE-CAPILLARY: 140 mg/dL — AB (ref 65–99)
GLUCOSE-CAPILLARY: 210 mg/dL — AB (ref 65–99)
GLUCOSE-CAPILLARY: 282 mg/dL — AB (ref 65–99)
Glucose-Capillary: 339 mg/dL — ABNORMAL HIGH (ref 65–99)

## 2016-03-09 NOTE — Discharge Summary (Signed)
Shane [name] [id number] DOB: October 02, 1970 DOA: 03/08/2016  PCP: Minerva Ends, MD  Admit date: 03/08/2016  Discharge date: 03/09/2016  Admitted From: home   Disposition:  Home   Recommendations for Outpatient Follow-up:   Follow up with PCP in 1-2 weeks  PCP Please obtain BMP/CBC, 2 view CXR in 1week,  (see Discharge instructions)   PCP Please follow up on the following pending results: Follow pending A1c levels    Home Health: None   Equipment/Devices: None  Consultations: Neuro Discharge Condition: Stable   CODE STATUS: Full   Diet Recommendation: Heart Healthy Low Carb   Chief Complaint  Patient presents with  . Code Stroke  . Chest Pain     Brief history of present illness from the day of admission and additional interim summary    45 year old male with history of depression, diabetes, hypertension, chronic CHF, PVD status post amputation of some of his left toes, patient presents with left-sided weakness, MRI brain negative for acute CVA, left-sided weakness is inconsistent on physical exam, and he appears at some points not putting any effort.  Hospital issues addressed    1.Left-sided weakness. Questionable presentation with poor effort on exam in the ER, entire workup was negative, underwent CT head CT angiogram head and neck and MRI brain all negative, exam upon admission was inconsistent per admitting M.D., this morning normal exam, LDL stable, recent A1c was 8 but he is following with PCP for diabetes management and glycemic control, continue aspirin and high intensity statin, follow pending A1c levels, follow with PCP for secondary risk factor modification, follow with neuro outpatient post discharge. No reason for PT-OT & speech evaluations as all symptoms have resolved.  2. DM type II on  insulin and continue home regimen and follow with PCP for vital control.  3. 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 Sharol Given. Continue the same. Continue chronic doxycycline.  4. Chronic diastolic dysfunction EF [id number] on recent echocardiogram fume months ago. Compensated continue Coreg.  5. Hypertension. Continue home regimen unchanged.  6. Depression/bipolar disorder. Not suicidal homicidal, no acute issues, continue Prozac, Depakote at home dose.   Discharge diagnosis     Active Problems:   Left-sided weakness   Essential hypertension   Depression with anxiety   Diabetes mellitus type 2, uncontrolled, with complications (HCC)   Cellulitis of foot   Chronic diastolic CHF (congestive heart failure), NYHA class 1 [facility])    Discharge instructions    Discharge Instructions    Discharge instructions    Complete by:  As directed    Follow with Primary MD Minerva Ends, MD and Dr Sharol Given in 7 days   Get CBC, CMP, 2 view Chest X ray checked  by Primary MD or SNF MD in 5-7 days ( we routinely change or add medications that can affect your baseline labs and fluid status, therefore we recommend that you get the mentioned basic workup next  visit with your PCP, your PCP may decide not to get them or add new tests based on their clinical decision)   Activity: As tolerated with Full fall precautions use walker/cane & assistance as needed   Disposition Home    Diet:   Heart Healthy low carb.  Accuchecks 4 times/day, Once in AM empty stomach and then before each meal. Log in all results and show them to your Prim.MD in 3 days. If any glucose reading is under 80 or above 300 call your Prim MD immidiately. Follow Low glucose instructions for glucose under 80 as instructed.   For Heart failure patients - Check your Weight same time everyday, if you gain over 2 pounds, or you  develop in leg swelling, experience more shortness of breath or chest pain, call your Primary MD immediately. Follow Cardiac Low Salt Diet and 1.5 lit/day fluid restriction.   On your next visit with your primary care physician please Get Medicines reviewed and adjusted.   Please request your Prim.MD to go over all Hospital Tests and Procedure/Radiological results at the follow up, please get all Hospital records sent to your Prim MD by signing hospital release before you go home.   If you experience worsening of your admission symptoms, develop shortness of breath, life threatening emergency, suicidal or homicidal thoughts you must seek medical attention immediately by calling 911 or calling your MD immediately  if symptoms less severe.  You Must read complete instructions/literature along with all the possible adverse reactions/side effects for all the Medicines you take and that have been prescribed to you. Take any new Medicines after you have completely understood and accpet all the possible adverse reactions/side effects.   Do not drive, operate heavy machinery, perform activities at heights, swimming or participation in water activities or provide baby sitting services if your were admitted for syncope or siezures until you have seen by Primary MD or a Neurologist and advised to do so again.  Do not drive when taking Pain medications.    Do not take more than prescribed Pain, Sleep and Anxiety Medications  Special Instructions: If you have smoked or chewed Tobacco  in the last 2 yrs please stop smoking, stop any regular Alcohol  and or any Recreational drug use.  Wear Seat belts while driving.   Please note  You were cared for by a hospitalist during your hospital stay. If you have any questions about your discharge medications or the care you received while you were in the hospital after you are discharged, you can call the unit and asked to speak with the hospitalist on call if  the hospitalist that took care of you is not available. Once you are discharged, your primary care physician will handle any further medical issues. Please note that NO REFILLS for any discharge medications will be authorized once you are discharged, as it is imperative that you return to your primary care physician (or establish a relationship with a primary care physician if you do not have one) for your aftercare needs so that they can reassess your need for medications and monitor your lab values.   Increase activity slowly    Complete by:  As directed       Discharge Medications     Medication List    TAKE these medications   amLODipine 10 MG tablet Commonly known as:  NORVASC Take 1 tablet (10 mg total) by mouth daily.   aspirin EC 325 MG tablet Take 1 tablet (325 mg  total) by mouth daily.   atorvastatin 80 MG tablet Commonly known as:  LIPITOR Take 1 tablet (80 mg total) by mouth every morning.   carvedilol 12.5 MG tablet Commonly known as:  COREG TAKE 1 TABLET BY MOUTH 2 TIMES DAILY WITH A MEAL   divalproex 500 MG DR tablet Commonly known as:  DEPAKOTE Take 2 tablets (1,000 mg total) by mouth at bedtime.   doxycycline 100 MG tablet Commonly known as:  VIBRA-TABS Take 1 tablet (100 mg total) by mouth 2 (two) times daily.   ferrous sulfate 325 (65 FE) MG tablet Take 1 tablet (325 mg total) by mouth 2 (two) times daily with a meal. What changed:  when to take this   FLUoxetine 10 MG tablet Commonly known as:  PROZAC Take 1 tablet (10 mg total) by mouth every morning.   gabapentin 100 MG capsule Commonly known as:  NEURONTIN Take 2 capsules (200 mg total) by mouth 3 (three) times daily.   glipiZIDE 10 MG 24 hr tablet Commonly known as:  GLIPIZIDE XL Take 1 tablet (10 mg total) by mouth daily with breakfast.   glucose blood test strip Commonly known as:  TRUE METRIX BLOOD GLUCOSE TEST Use as instructed   HYDROcodone-acetaminophen 5-325 MG tablet Commonly  known as:  NORCO/VICODIN Take 1 tablet by mouth every 6 (six) hours as needed for moderate pain.   ibuprofen 800 MG tablet Commonly known as:  ADVIL,MOTRIN TAKE 1 TABLET BY MOUTH EVERY 8 HOURS AS NEEDED. What changed:  See the new instructions.   Insulin Glargine 100 UNIT/ML Solostar Pen Commonly known as:  LANTUS SOLOSTAR Inject 10 Units into the skin daily.   Insulin Pen Needle 31G X 8 MM Misc Commonly known as:  B-D ULTRAFINE III SHORT PEN 1 application by Does not apply route daily.   lisinopril 40 MG tablet Commonly known as:  PRINIVIL,ZESTRIL Take 1 tablet (40 mg total) by mouth daily.   metFORMIN 1000 MG tablet Commonly known as:  GLUCOPHAGE Take 1 tablet (1,000 mg total) by mouth 2 (two) times daily with a meal.   ondansetron 8 MG disintegrating tablet Commonly known as:  ZOFRAN ODT Take 1 tablet (8 mg total) by mouth every 8 (eight) hours as needed for nausea or vomiting.   pantoprazole 40 MG tablet Commonly known as:  PROTONIX Take 1 tablet (40 mg total) by mouth daily.   saccharomyces boulardii 250 MG capsule Commonly known as:  FLORASTOR Take 1 capsule (250 mg total) by mouth 2 (two) times daily.   silver sulfADIAZINE 1 % cream Commonly known as:  SILVADENE Apply topically daily.   traZODone 100 MG tablet Commonly known as:  DESYREL Take 1 tablet (100 mg total) by mouth at bedtime.   TRUEPLUS LANCETS 28G Misc Use as directed   Vitamin D (Ergocalciferol) 50000 units Caps capsule Commonly known as:  DRISDOL Take 1 capsule (50,000 Units total) by mouth every 30 (thirty) days. What changed:  additional instructions       Follow-up Information    Minerva Ends, MD. Schedule an appointment as soon as possible for a visit in 1 week(s).   Specialty:  Family Medicine Why:  and Dr Donna Christen information: [place] [address] [phone]        SETHI,PRAMOD, MD. Schedule an appointment as soon as possible for a visit in 2  week(s).   Specialties:  Neurology, Radiology Contact information: [address] [place] [address] [phone]  Major procedures and Radiology Reports - PLEASE review detailed and final reports thoroughly  -      TTE 01-20-16  - Left ventricle: The cavity size was normal. Wall thickness was increased in a pattern of mild LVH. Systolic function was normal. The estimated ejection fraction was in the range of 55% to 60%. Wall motion was normal; there were no regional wall motion abnormalities. The study is not technically sufficient to allow evaluation of LV diastolic function. - Aortic valve: There was trivial regurgitation. - Mitral valve: There was mild regurgitation. - Left atrium: The atrium was mildly dilated. - Pericardium, extracardiac: A trivial pericardial effusion was identified.  Impressions:  - Normal LV systolic function; mild LVH; trace AI; mild MR; mild  LAE.   Ct Angio Head W Or Wo Contrast  Result Date: 03/08/2016 CLINICAL DATA:  Acute presentation with left-sided weakness. EXAM: CT ANGIOGRAPHY HEAD AND NECK TECHNIQUE: Multidetector CT imaging of the head and neck was performed using the standard protocol during bolus administration of intravenous contrast. Multiplanar CT image reconstructions and MIPs were obtained to evaluate the vascular anatomy. Carotid stenosis measurements (when applicable) are obtained utilizing NASCET criteria, using the distal internal carotid diameter as the denominator. CONTRAST:  50 cc Isovue 370 COMPARISON:  CT earlier same day FINDINGS: CTA NECK Aortic arch: Normal.  No atherosclerosis, aneurysm or dissection. Right carotid system: Common carotid artery widely patent to the bifurcation. No carotid bifurcation atherosclerosis, narrowing or irregularity. Cervical internal carotid artery is normal. Left carotid system: Common carotid artery widely patent to the bifurcation. No carotid bifurcation atherosclerosis,  narrowing or irregularity. Cervical internal carotid artery is normal. Vertebral arteries:Normal Skeleton: Normal Other neck: No soft tissue lesion Upper chest: Poor inspiration.  No focal finding. CTA HEAD Anterior circulation: Both internal carotid arteries are widely patent through the skullbase and siphon region. There is some atherosclerotic calcification of the carotid siphon with narrowing of the proximal siphon on the left of 30%. The anterior and middle cerebral vessels are patent without proximal stenosis, aneurysm or vascular malformation. No evidence of embolic occlusion. Posterior circulation: Both vertebral arteries widely patent to the basilar. No basilar stenosis. Posterior circulation branch vessels are normal. Venous sinuses: Patent and normal. Anatomic variants: None significant Delayed phase: No abnormal enhancement IMPRESSION: No atherosclerotic disease in the neck. No intracranial large vessel occlusion. Atherosclerotic change in the carotid siphon regions with 30% stenosis in the proximal siphon on the left. These results were called by telephone at the time of interpretation on 03/08/2016 at 11:15 am to Dr. Norman Clay , who verbally acknowledged these results. Electronically Signed   By: Nelson Chimes M.D.   On: 03/08/2016 11:17   Dg Chest 2 View  Result Date: 02/29/2016 CLINICAL DATA:  Shortness of breath for 1 week. Lung crackles. History of hypertension, diabetes, CHF. EXAM: CHEST  2 VIEW COMPARISON:  Chest x-rays dated 01/20/2016 in 01/18/2016. FINDINGS: Study is hypoinspiratory with crowding of the interstitial markings. Given the low lung volumes, lungs appear clear. No evidence of pneumonia. No pleural effusion or pneumothorax seen. Heart size is normal. Osseous structures about the chest are unremarkable. IMPRESSION: Low lung volumes. No active cardiopulmonary disease. No evidence of pneumonia. Electronically Signed   By: Franki Cabot M.D.   On: 02/29/2016 17:18   Ct  Angio Neck W Or Wo Contrast  Result Date: 03/08/2016 CLINICAL DATA:  Acute presentation with left-sided weakness. EXAM: CT ANGIOGRAPHY HEAD AND NECK TECHNIQUE: Multidetector CT imaging of the head and neck  was performed using the standard protocol during bolus administration of intravenous contrast. Multiplanar CT image reconstructions and MIPs were obtained to evaluate the vascular anatomy. Carotid stenosis measurements (when applicable) are obtained utilizing NASCET criteria, using the distal internal carotid diameter as the denominator. CONTRAST:  50 cc Isovue 370 COMPARISON:  CT earlier same day FINDINGS: CTA NECK Aortic arch: Normal.  No atherosclerosis, aneurysm or dissection. Right carotid system: Common carotid artery widely patent to the bifurcation. No carotid bifurcation atherosclerosis, narrowing or irregularity. Cervical internal carotid artery is normal. Left carotid system: Common carotid artery widely patent to the bifurcation. No carotid bifurcation atherosclerosis, narrowing or irregularity. Cervical internal carotid artery is normal. Vertebral arteries:Normal Skeleton: Normal Other neck: No soft tissue lesion Upper chest: Poor inspiration.  No focal finding. CTA HEAD Anterior circulation: Both internal carotid arteries are widely patent through the skullbase and siphon region. There is some atherosclerotic calcification of the carotid siphon with narrowing of the proximal siphon on the left of 30%. The anterior and middle cerebral vessels are patent without proximal stenosis, aneurysm or vascular malformation. No evidence of embolic occlusion. Posterior circulation: Both vertebral arteries widely patent to the basilar. No basilar stenosis. Posterior circulation branch vessels are normal. Venous sinuses: Patent and normal. Anatomic variants: None significant Delayed phase: No abnormal enhancement IMPRESSION: No atherosclerotic disease in the neck. No intracranial large vessel occlusion.  Atherosclerotic change in the carotid siphon regions with 30% stenosis in the proximal siphon on the left. These results were called by telephone at the time of interpretation on 03/08/2016 at 11:15 am to Dr. Norman Clay , who verbally acknowledged these results. Electronically Signed   By: Nelson Chimes M.D.   On: 03/08/2016 11:17   Mr Jodene Nam Head Wo Contrast  Result Date: 03/09/2016 CLINICAL DATA:  Persistent LEFT-sided weakness. Follow-up brain MRI. History of hypertension, diabetes. EXAM: MRA HEAD WITHOUT CONTRAST TECHNIQUE: Angiographic images of the Circle of Willis were obtained using MRA technique without intravenous contrast. COMPARISON:  MRI of the head March 08, 2016 at 1157 hours, CTA HEAD March 08, 2016 at 1117 hours and MRA head August 25, 2014 FINDINGS: ANTERIOR CIRCULATION: Normal flow related enhancement of the included cervical, petrous, cavernous and supraclinoid internal carotid arteries. Mild narrowing of the LEFT cavernous internal carotid artery. Patent anterior communicating artery. Normal flow related enhancement of the anterior and middle cerebral arteries, including distal segments. No large vessel occlusion, high-grade stenosis, abnormal luminal irregularity, aneurysm. POSTERIOR CIRCULATION: Codominant vertebral arteries. Basilar artery is patent, with normal flow related enhancement of the main branch vessels. Normal flow related enhancement of the posterior cerebral arteries. Small RIGHT posterior communicating artery present. No large vessel occlusion, high-grade stenosis, abnormal luminal irregularity, aneurysm. IMPRESSION: No emergent large vessel occlusion or high-grade stenosis. Mild stenosis LEFT cavernous internal carotid artery unchanged from today's CTA HEAD. Electronically Signed   By: Elon Alas M.D.   On: 03/09/2016 02:58   Mr Brain Wo Contrast  Result Date: 03/08/2016 CLINICAL DATA:  Dizziness and fall. Left arm and leg weakness with chest pain.  EXAM: MRI HEAD WITHOUT CONTRAST TECHNIQUE: Multiplanar, multiecho pulse sequences of the brain and surrounding structures were obtained without intravenous contrast. COMPARISON:  12/23/2015 FINDINGS: Brain: No acute infarction, hemorrhage, hydrocephalus, extra-axial collection or mass lesion. No white matter disease or atrophy. Chronic single focus of susceptibility in the left frontal pole which is incidental in isolation. Vascular: Normal flow voids. Skull and upper cervical spine: Normal marrow signal. Sinuses/Orbits: Negative. Other: None. IMPRESSION: Stable, negative  exam.  No explanation for weakness. Electronically Signed   By: Monte Fantasia M.D.   On: 03/08/2016 12:19   Dg Foot Complete Right  Result Date: 02/20/2016 CLINICAL DATA:  Right foot pain. EXAM: RIGHT FOOT COMPLETE - 3+ VIEW COMPARISON:  Radiographs of January 11, 2016. FINDINGS: There is no evidence of fracture or dislocation. There is no evidence of arthropathy or other focal bone abnormality. Vascular calcifications are noted. Dressing is seen overlying dorsal soft tissues. IMPRESSION: No definite osseous abnormality seen. Dorsal soft tissue swelling is noted. Electronically Signed   By: Marijo Conception, M.D.   On: 02/20/2016 19:15   Ct Head Code Stroke W/o Cm  Result Date: 03/08/2016 CLINICAL DATA:  Code stroke. Left-sided weakness beginning a acutely with fall. EXAM: CT HEAD WITHOUT CONTRAST TECHNIQUE: Contiguous axial images were obtained from the base of the skull through the vertex without intravenous contrast. COMPARISON:  MRI 12/23/2015.  CT 12/10/2015. FINDINGS: The brain has a normal appearance without evidence for hemorrhage, acute infarction, hydrocephalus, or mass lesion. There is no extra axial fluid collection. The skull and paranasal sinuses are normal. ASPECTS ([place] Stroke Program Early CT Score) - Ganglionic level infarction (caudate, lentiform nuclei, internal capsule, insula, M1-M3 cortex): 7 - Supraganglionic  infarction (M4-M6 cortex): 3 Total score (0-10 with 10 being normal): 10 IMPRESSION: 1. Normal head CT 2. ASPECTS is 10 These results were called by telephone at the time of interpretation on 03/08/2016 at 10:57 am to Dr. Norman Clay , who verbally acknowledged these results. Electronically Signed   By: Nelson Chimes M.D.   On: 03/08/2016 10:59    Micro Results    No results found for this or any previous visit (from the past 240 hour(s)).  Today   Subjective    Shane [name] today has no headache,no chest abdominal pain,no new weakness tingling or numbness, feels much better wants to go home today.    Objective   Blood pressure (!) 154/97, pulse 83, temperature 97.8 F (36.6 C), temperature source Oral, resp. rate 15, height 5\' 8"  (1.727 m), weight 80.2 kg (176 lb 14.4 oz), SpO2 100 %.  No intake or output data in the 24 hours ending 03/09/16 0846  Exam Awake Alert, Oriented x 3, No new F.N deficits, Normal affect [place].AT,PERRAL Supple Neck,No JVD, No cervical lymphadenopathy appriciated.  Symmetrical Chest wall movement, Good air movement bilaterally, CTAB RRR,No Gallops,Rubs or new Murmurs, No Parasternal Heave +ve B.Sounds, Abd Soft, Non tender, No organomegaly appriciated, No rebound -guarding or rigidity. No Cyanosis, Clubbing or edema, No new Rash or bruise,Both feet under bandage, multiple toe amputation and the left foot   Data Review   CBC w Diff:  Lab Results  Component Value Date   WBC 6.0 03/08/2016   HGB 10.9 (L) 03/08/2016   HGB 9.8 (L) 02/09/2016   HCT 32.0 (L) 03/08/2016   HCT 30.7 (L) 02/09/2016   PLT 241 03/08/2016   PLT 296 02/09/2016   LYMPHOPCT 27 03/08/2016   LYMPHOPCT 25.1 02/09/2016   MONOPCT 6 03/08/2016   MONOPCT 5.3 02/09/2016   EOSPCT 2 03/08/2016   EOSPCT 3.5 02/09/2016   BASOPCT 0 03/08/2016   BASOPCT 0.7 02/09/2016    CMP:  Lab Results  Component Value Date   NA 140 03/08/2016   K 4.7 03/08/2016   CL 103 03/08/2016   CO2 23  03/08/2016   BUN 25 (H) 03/08/2016   CREATININE 1.20 03/08/2016   CREATININE 0.80 09/08/2015   PROT 6.5  03/08/2016   ALBUMIN 3.3 (L) 03/08/2016   BILITOT 0.2 (L) 03/08/2016   ALKPHOS 54 03/08/2016   AST 17 03/08/2016   ALT 12 (L) 03/08/2016  . Lab Results  Component Value Date   CHOL 101 03/09/2016   HDL 31 (L) 03/09/2016   LDLCALC 54 03/09/2016   TRIG 79 03/09/2016   CHOLHDL 3.3 03/09/2016   Lab Results  Component Value Date   HGBA1C 8.0 (H) 01/01/2016     Total Time in preparing paper work, data evaluation and todays exam - 35 minutes  Thurnell Lose M.D on 03/09/2016 at 8:46 AM  [facility]   Office  [phone]

## 2016-03-09 NOTE — Care Management Note (Addendum)
Case Management Note  Patient Details  Name: Shane [name] MRN: [id number] Date of Birth: July 08, 1970  Subjective/Objective: 45 yo M with hx of depression, diabetes, hypertension, chronic CHF, PVD s/p amputation of some of his L left toes. He fell at home and presents with L sided weakness and CP. MRI brain negative for acute CVA                   Action/Plan: received call from RN - pt D/C and he is active for [facility]   Expected Discharge Date:   03/09/16                Expected Discharge Plan:  [place]  In-House Referral:     Discharge planning Services  CM Consult  Post Acute Care Choice:    Choice offered to:     DME Arranged:    DME Agency:     HH Arranged:    [name] Agency:     Status of Service:  Completed, signed off  If discussed at [employer], dates discussed:    Additional Comments: per previous CM note, pt is active with [facility]. Met with pt at bedside. He is going home today. He has the support of his wife. He reports that he is active with [facility] for wound care. Contacted Jamaine at [facility] and made him aware of pt d/c order. Informed RN that pt needs an order to resume [employer].  Norina Buzzard, RN 03/09/2016, 11:28 AM

## 2016-03-09 NOTE — Progress Notes (Signed)
PT Cancellation Note  Patient Details Name: Shane [name] MRN: [id number] DOB: 1971/04/04   Cancelled Treatment:    Reason Eval/Treat Not Completed: Other (comment) (Per MD note pt back at baseline and no PT eval needed)   Shane [name] 03/09/2016, 11:03 AM [facility] PT [phone]

## 2016-03-09 NOTE — Discharge Instructions (Signed)
Follow with Primary MD Minerva Ends, MD and Dr Sharol Given in 7 days   Get CBC, CMP, 2 view Chest X ray checked  by Primary MD or SNF MD in 5-7 days ( we routinely change or add medications that can affect your baseline labs and fluid status, therefore we recommend that you get the mentioned basic workup next visit with your PCP, your PCP may decide not to get them or add new tests based on their clinical decision)   Activity: As tolerated with Full fall precautions use walker/cane & assistance as needed   Disposition Home    Diet:   Heart Healthy low carb.  Accuchecks 4 times/day, Once in AM empty stomach and then before each meal. Log in all results and show them to your Prim.MD in 3 days. If any glucose reading is under 80 or above 300 call your Prim MD immidiately. Follow Low glucose instructions for glucose under 80 as instructed.   For Heart failure patients - Check your Weight same time everyday, if you gain over 2 pounds, or you develop in leg swelling, experience more shortness of breath or chest pain, call your Primary MD immediately. Follow Cardiac Low Salt Diet and 1.5 lit/day fluid restriction.   On your next visit with your primary care physician please Get Medicines reviewed and adjusted.   Please request your Prim.MD to go over all Hospital Tests and Procedure/Radiological results at the follow up, please get all Hospital records sent to your Prim MD by signing hospital release before you go home.   If you experience worsening of your admission symptoms, develop shortness of breath, life threatening emergency, suicidal or homicidal thoughts you must seek medical attention immediately by calling 911 or calling your MD immediately  if symptoms less severe.  You Must read complete instructions/literature along with all the possible adverse reactions/side effects for all the Medicines you take and that have been prescribed to you. Take any new Medicines after you have  completely understood and accpet all the possible adverse reactions/side effects.   Do not drive, operate heavy machinery, perform activities at heights, swimming or participation in water activities or provide baby sitting services if your were admitted for syncope or siezures until you have seen by Primary MD or a Neurologist and advised to do so again.  Do not drive when taking Pain medications.    Do not take more than prescribed Pain, Sleep and Anxiety Medications  Special Instructions: If you have smoked or chewed Tobacco  in the last 2 yrs please stop smoking, stop any regular Alcohol  and or any Recreational drug use.  Wear Seat belts while driving.   Please note  You were cared for by a hospitalist during your hospital stay. If you have any questions about your discharge medications or the care you received while you were in the hospital after you are discharged, you can call the unit and asked to speak with the hospitalist on call if the hospitalist that took care of you is not available. Once you are discharged, your primary care physician will handle any further medical issues. Please note that NO REFILLS for any discharge medications will be authorized once you are discharged, as it is imperative that you return to your primary care physician (or establish a relationship with a primary care physician if you do not have one) for your aftercare needs so that they can reassess your need for medications and monitor your lab values.

## 2016-03-09 NOTE — Progress Notes (Signed)
STROKE TEAM PROGRESS NOTE   HISTORY OF PRESENT ILLNESS (per record) Shane [name] is an 45 y.o. male with a hx of TIA's, DM and HLP presents today after feeling dizzy and falling, following the fall realized he had L arm and L leg weakness along with CP  Date last known well: 03/08/16 Time last known well: 0900 tPA Given: No: exam inconsistent, MRI negative   SUBJECTIVE (INTERVAL HISTORY) States that weakness is mostly improved   OBJECTIVE Temp:  [97.8 F (36.6 C)] 97.8 F (36.6 C) (09/23 0742) Pulse Rate:  [82-93] 83 (09/23 0742) Cardiac Rhythm: Normal sinus rhythm (09/22 2004) Resp:  [12-28] 15 (09/23 0742) BP: (131-188)/(89-110) 154/97 (09/23 0742) SpO2:  [96 %-100 %] 100 % (09/23 0742) Weight:  [80.2 kg (176 lb 14.4 oz)-85.2 kg (187 lb 13.3 oz)] 80.2 kg (176 lb 14.4 oz) (09/23 0457)  CBC:  Recent Labs Lab 03/08/16 1049 03/08/16 1057  WBC 6.0  --   NEUTROABS 3.9  --   HGB 10.0* 10.9*  HCT 31.8* 32.0*  MCV 94.9  --   PLT 241  --     Basic Metabolic Panel:  Recent Labs Lab 03/08/16 1049 03/08/16 1057  NA 139 140  K 4.8 4.7  CL 106 103  CO2 23  --   GLUCOSE 224* 222*  BUN 22* 25*  CREATININE 1.21 1.20  CALCIUM 9.1  --     Lipid Panel:    Component Value Date/Time   CHOL 101 03/09/2016 0127   TRIG 79 03/09/2016 0127   HDL 31 (L) 03/09/2016 0127   CHOLHDL 3.3 03/09/2016 0127   VLDL 16 03/09/2016 0127   LDLCALC 54 03/09/2016 0127   HgbA1c:  Lab Results  Component Value Date   HGBA1C 8.0 (H) 01/01/2016   Urine Drug Screen:    Component Value Date/Time   LABOPIA NONE DETECTED 03/08/2016 1232   COCAINSCRNUR NONE DETECTED 03/08/2016 1232   LABBENZ NONE DETECTED 03/08/2016 1232   AMPHETMU NONE DETECTED 03/08/2016 1232   THCU NONE DETECTED 03/08/2016 1232   LABBARB NONE DETECTED 03/08/2016 1232      IMAGING  Ct Angio Head W Or Wo Contrast 03/08/2016 No atherosclerotic disease in the neck. No intracranial large vessel occlusion. Atherosclerotic  change in the carotid siphon regions with 30% stenosis in the proximal siphon on the left.    Ct Angio Neck W Or Wo Contrast 03/08/2016 No atherosclerotic disease in the neck. No intracranial large vessel occlusion. Atherosclerotic change in the carotid siphon regions with 30% stenosis in the proximal siphon on the left.     Mr Jodene Nam Head Wo Contrast 03/09/2016 No emergent large vessel occlusion or high-grade stenosis. Mild stenosis LEFT cavernous internal carotid artery unchanged from today's CTA HEAD.   Mr Brain Wo Contrast 03/08/2016 Stable, negative exam.  No explanation for weakness.   Ct Head Code Stroke W/o Cm 03/08/2016 1. Normal head CT  2. ASPECTS is 10     PHYSICAL EXAM Alert and oriented 3 Cranial nerves II-12 intact Motor exam right side is within normal limits, left side has mild giveaway weakness. Sensations are intact Finger to nose test is normal Speech is fluent Gait was not assessed       ASSESSMENT/PLAN [name] is a 45 y.o. male with history of diabetes mellitus, TIAs, obstructive sleep apnea, Jehovah's Witness, history of lower GI bleed, carotid artery disease, hypertension, hyperlipidemia, congestive heart failure, and bipolar disorder presenting with dizziness, subsequent fall, with left-sided weakness and chest pain.  He did not receive IV t-PA due to inconsistent exam and negative MRI.   Patient exam and diagnostic studies are consistent with psychogenic weakness. Findings discussed with the patient in detail, patient agrees to follow up with his psychiatrist, has history of bipolar disorder and  MRI - negative for stroke  MRA - no significant stenosis  CTA H&N - no significant stenosis  2D Echo - EF 55-60%. No cardiac source of emboli identified.  LDL - 54  HgbA1c pending  VTE prophylaxis - Lovenox  Diet Heart Room service appropriate? Yes; Fluid consistency: Thin  aspirin 325 mg daily prior to admission, now on aspirin 325 mg  daily  Patient counseled to be compliant with his antithrombotic medications  Ongoing aggressive stroke risk factor management  Therapy recommendations: Pending  Disposition: Pending  Hypertension  Stable   Hyperlipidemia  Home meds:  Lipitor 80 mg daily resumed in hospital  LDL 54, goal < 70  Continue statin at discharge  Diabetes  HgbA1c pending, goal < 7.0  Uncontrolled  Other Stroke Risk Factors  Hx stroke/TIA  Family hx stroke (Father)  Obstructive sleep apnea  Other Active Problems  Anemia  Hx of Gi bleed  BUN - 25    To contact Stroke Continuity provider, please refer to [email or web address] After hours, contact General Neurology

## 2016-03-10 LAB — HEMOGLOBIN A1C
Hgb A1c MFr Bld: 7.8 % — ABNORMAL HIGH (ref 4.8–5.6)
MEAN PLASMA GLUCOSE: 177 mg/dL

## 2016-03-11 ENCOUNTER — Ambulatory Visit: Payer: Medicaid Other | Attending: Family Medicine | Admitting: Family Medicine

## 2016-03-11 ENCOUNTER — Encounter: Payer: Self-pay | Admitting: Family Medicine

## 2016-03-11 VITALS — BP 106/69 | HR 78 | Temp 98.0°F | Resp 17 | Ht 68.0 in | Wt 184.6 lb

## 2016-03-11 DIAGNOSIS — Z7982 Long term (current) use of aspirin: Secondary | ICD-10-CM | POA: Insufficient documentation

## 2016-03-11 DIAGNOSIS — F418 Other specified anxiety disorders: Secondary | ICD-10-CM

## 2016-03-11 DIAGNOSIS — Z79899 Other long term (current) drug therapy: Secondary | ICD-10-CM | POA: Diagnosis not present

## 2016-03-11 DIAGNOSIS — R079 Chest pain, unspecified: Secondary | ICD-10-CM | POA: Diagnosis not present

## 2016-03-11 DIAGNOSIS — Z23 Encounter for immunization: Secondary | ICD-10-CM | POA: Diagnosis not present

## 2016-03-11 DIAGNOSIS — E118 Type 2 diabetes mellitus with unspecified complications: Secondary | ICD-10-CM

## 2016-03-11 DIAGNOSIS — M2550 Pain in unspecified joint: Secondary | ICD-10-CM

## 2016-03-11 DIAGNOSIS — Z7984 Long term (current) use of oral hypoglycemic drugs: Secondary | ICD-10-CM | POA: Diagnosis not present

## 2016-03-11 DIAGNOSIS — L97513 Non-pressure chronic ulcer of other part of right foot with necrosis of muscle: Secondary | ICD-10-CM

## 2016-03-11 DIAGNOSIS — Z794 Long term (current) use of insulin: Secondary | ICD-10-CM | POA: Insufficient documentation

## 2016-03-11 DIAGNOSIS — E11621 Type 2 diabetes mellitus with foot ulcer: Secondary | ICD-10-CM

## 2016-03-11 DIAGNOSIS — R0789 Other chest pain: Secondary | ICD-10-CM | POA: Insufficient documentation

## 2016-03-11 DIAGNOSIS — E1165 Type 2 diabetes mellitus with hyperglycemia: Secondary | ICD-10-CM | POA: Diagnosis not present

## 2016-03-11 DIAGNOSIS — IMO0002 Reserved for concepts with insufficient information to code with codable children: Secondary | ICD-10-CM

## 2016-03-11 LAB — GLUCOSE, POCT (MANUAL RESULT ENTRY): POC Glucose: 279 mg/dl — AB (ref 70–99)

## 2016-03-11 MED ORDER — FLUOXETINE HCL 10 MG PO TABS: 20.0000 mg | ORAL_TABLET | ORAL | 5 refills | Status: DC

## 2016-03-11 MED FILL — ATORVASTATIN 80 MG TABLET: 80 | 30 days supply | Qty: 30 | Fill #10

## 2016-03-11 MED FILL — FLUoxetine HCL 10 MG CAPS: 10 | 15 days supply | Qty: 30 | Fill #0

## 2016-03-11 MED FILL — LISINOPRIL 40 MG TABLET: 40 | 30 days supply | Qty: 30 | Fill #4

## 2016-03-11 NOTE — Progress Notes (Signed)
Pt is in today for a hospital follow up  Presents with complaints of chest pain & shortness of breath Pt has not eaten today but has had his medications  Pt needs refill on prozac, states it should be 20 mg but is prescribed at 10 mg  Pt confirms pain in right foot due to fall on Friday  Pt consents to flu shot today  cgb 279

## 2016-03-11 NOTE — Patient Instructions (Addendum)
Shane [name] was seen today for hospitalization follow-up and chest pain.  Diagnoses and all orders for this visit:  Uncontrolled type 2 diabetes mellitus with complication, without long-term current use of insulin (HCC) -     Glucose (CBG)  Depression with anxiety -     FLUoxetine (PROZAC) 10 MG tablet; Take 2 tablets (20 mg total) by mouth every morning.  Joint pain -     Cancel: Lyme Ab/Western Blot Reflex -     Lyme Ab/Western Blot Reflex  Other orders -     Flu Vaccine QUAD 36+ mos IM   Please return in one week for repeat CBC, BMP, lab visit  Repeat CXR is also to be done in one week   F/u with me 2-3 weeks to review labs   Dr. Adrian Blackwater

## 2016-03-11 NOTE — Assessment & Plan Note (Signed)
Improving  Continue wound care Continue diabetes control

## 2016-03-11 NOTE — Progress Notes (Signed)
Subjective:  Patient ID: Shane [name], male    DOB: [birth year]  Age: 45 y.o. MRN: [id number]  CC: Hospitalization Follow-up and Chest Pain   HPI Ayrton Zimmerle has diabetes, bipolar depression, anxiety he presents for   1. HFU chest pain and L sided weakness: symptoms hospitalized from 9/22-9/23/17. Stroke work up was negative.  She denies recurrent L sided weakness. She still has chest pains.   2. ? 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 He is concerned about lyme disease due to fatigue, chest pains, joint pain and intermittent neuropathies without evidence of CAD or CVA.   3. Diabetic foot ulcers: healing. He is still on doxycyline. He follow up with orthopaedic surgery for wound care every 2 weeks. He numbness in feet so denies pain.   Social History  Substance Use Topics  . Smoking status: Never Smoker  . Smokeless tobacco: Never Used  . Alcohol use No    Outpatient Medications Prior to Visit  Medication Sig Dispense Refill  . amLODipine (NORVASC) 10 MG tablet Take 1 tablet (10 mg total) by mouth daily. 30 tablet 0  . aspirin EC 325 MG tablet Take 1 tablet (325 mg total) by mouth daily. 30 tablet 0  . atorvastatin (LIPITOR) 80 MG tablet Take 1 tablet (80 mg total) by mouth every morning. 30 tablet 5  . carvedilol (COREG) 12.5 MG tablet TAKE 1 TABLET BY MOUTH 2 TIMES DAILY WITH A MEAL 60 tablet 3  . divalproex (DEPAKOTE) 500 MG DR tablet Take 2 tablets (1,000 mg total) by mouth at bedtime. 30 tablet 5  . doxycycline (VIBRA-TABS) 100 MG tablet Take 1 tablet (100 mg total) by mouth 2 (two) times daily. 14 tablet 0  . ferrous sulfate 325 (65 FE) MG tablet Take 1 tablet (325 mg total) by mouth 2 (two) times daily with a meal. (Patient taking differently: Take 325 mg by mouth daily with breakfast. ) 60 tablet 5  . FLUoxetine (PROZAC) 10 MG tablet Take 1 tablet (10 mg total) by mouth every morning. 30 tablet 3  . gabapentin  (NEURONTIN) 100 MG capsule Take 2 capsules (200 mg total) by mouth 3 (three) times daily. 90 capsule 0  . glipiZIDE (GLIPIZIDE XL) 10 MG 24 hr tablet Take 1 tablet (10 mg total) by mouth daily with breakfast. 90 tablet 3  . glucose blood (TRUE METRIX BLOOD GLUCOSE TEST) test strip Use as instructed 100 each 12  . HYDROcodone-acetaminophen (NORCO/VICODIN) 5-325 MG tablet Take 1 tablet by mouth every 6 (six) hours as needed for moderate pain. 12 tablet 0  . ibuprofen (ADVIL,MOTRIN) 800 MG tablet TAKE 1 TABLET BY MOUTH EVERY 8 HOURS AS NEEDED. (Patient taking differently: TAKE 1 TABLET BY MOUTH EVERY 8 HOURS AS NEEDED FOR PAIN) 30 tablet 0  . Insulin Glargine (LANTUS SOLOSTAR) 100 UNIT/ML Solostar Pen Inject 10 Units into the skin daily. 1 pen 5  . Insulin Pen Needle (B-D ULTRAFINE III SHORT PEN) 31G X 8 MM MISC 1 application by Does not apply route daily. 30 each 11  . lisinopril (PRINIVIL,ZESTRIL) 40 MG tablet Take 1 tablet (40 mg total) by mouth daily. 30 tablet 5  . metFORMIN (GLUCOPHAGE) 1000 MG tablet Take 1 tablet (1,000 mg total) by mouth 2 (two) times daily with a meal. 60 tablet 5  . ondansetron (ZOFRAN ODT) 8 MG disintegrating tablet Take 1 tablet (8 mg total) by mouth every 8 (eight) hours as needed  for nausea or vomiting. 30 tablet 0  . pantoprazole (PROTONIX) 40 MG tablet Take 1 tablet (40 mg total) by mouth daily. 30 tablet 5  . saccharomyces boulardii (FLORASTOR) 250 MG capsule Take 1 capsule (250 mg total) by mouth 2 (two) times daily. 60 capsule 3  . silver sulfADIAZINE (SILVADENE) 1 % cream Apply topically daily. (Patient not taking: Reported on 03/08/2016) 50 g 0  . traZODone (DESYREL) 100 MG tablet Take 1 tablet (100 mg total) by mouth at bedtime. 30 tablet 5  . TRUEPLUS LANCETS 28G MISC Use as directed (Patient not taking: Reported on 03/08/2016) 100 each 12  . Vitamin D, Ergocalciferol, (DRISDOL) 50000 units CAPS capsule Take 1 capsule (50,000 Units total) by mouth every 30 (thirty)  days. (Patient taking differently: Take 50,000 Units by mouth every 30 (thirty) days. Every 30 days) 12 capsule 0   No facility-administered medications prior to visit.     ROS Review of Systems  Constitutional: Positive for fatigue. Negative for chills, fever and unexpected weight change.  Eyes: Negative for visual disturbance.  Respiratory: Positive for chest tightness. Negative for cough and shortness of breath.   Cardiovascular: Negative for chest pain, palpitations and leg swelling.  Gastrointestinal: Negative for abdominal pain, blood in stool, constipation, diarrhea, nausea and vomiting.  Endocrine: Negative for polydipsia, polyphagia and polyuria.  Musculoskeletal: Positive for arthralgias. Negative for back pain, gait problem, joint swelling, myalgias and neck pain.  Skin: Positive for wound. Negative for rash.  Allergic/Immunologic: Negative for immunocompromised state.  Neurological: Negative for dizziness and numbness.  Hematological: Negative for adenopathy. Does not bruise/bleed easily.  Psychiatric/Behavioral: Negative for dysphoric mood, sleep disturbance and suicidal ideas. The patient is not nervous/anxious.     Objective:  BP 106/69 (BP Location: Right Arm, Patient Position: Sitting, Cuff Size: Small)   Pulse 78   Temp 98 F (36.7 C) (Oral)   Resp 17   Ht 5\' 8"  (1.727 m)   Wt 184 lb 9.6 oz (83.7 kg)   SpO2 100%   BMI 28.07 kg/m   BP/Weight 03/11/2016 03/09/2016 [id number]  Systolic BP [id number] -  Diastolic BP 69 97 -  Wt. (Lbs) 184.6 176.9 -  BMI 28.07 - 26.9    Physical Exam  Constitutional: He appears well-developed and well-nourished. No distress.  HENT:  Head: Normocephalic and atraumatic.  Neck: Normal range of motion. Neck supple.  Cardiovascular: Normal rate, regular rhythm, normal heart sounds and intact distal pulses.   Pulses:      Femoral pulses are 1+ on the right side, and 1+ on the left side.      Popliteal pulses are 1+ on the right side,  and 1+ on the left side.       Dorsalis pedis pulses are 1+ on the right side, and 1+ on the left side.       Posterior tibial pulses are 1+ on the right side, and 1+ on the left side.  Pulmonary/Chest: Effort normal and breath sounds normal.  Musculoskeletal: He exhibits no edema.       Feet:  Neurological: He is alert.  Skin: Skin is warm and dry. No rash noted. No erythema.  Psychiatric: He has a normal mood and affect.   Lab Results  Component Value Date   HGBA1C 7.8 (H) 03/09/2016   CBG 279   Assessment & Plan:  Alee was seen today for hospitalization follow-up and chest pain.  Diagnoses and all orders for this visit:  Uncontrolled type 2 diabetes  mellitus with complication, without long-term current use of insulin (HCC) -     Glucose (CBG)  Depression with anxiety -     FLUoxetine (PROZAC) 10 MG tablet; Take 2 tablets (20 mg total) by mouth every morning.  Joint pain -     Cancel: Lyme Ab/Western Blot Reflex -     Lyme Ab/Western Blot Reflex  Intermittent chest pain -     Basic Metabolic Panel; Future -     CBC; Future -     DG Chest 2 View; Future  Other orders -     Flu Vaccine QUAD 36+ mos IM   There are no diagnoses linked to this encounter.  No orders of the defined types were placed in this encounter.   Follow-up: No Follow-up on file.   Boykin Nearing MD

## 2016-03-11 NOTE — Assessment & Plan Note (Signed)
Joints pain with chest pain and neurological symptoms without clear definitive cause Checking lyme disease titers with reflex western blot

## 2016-03-12 ENCOUNTER — Encounter: Payer: Self-pay | Admitting: Hematology and Oncology

## 2016-03-12 ENCOUNTER — Ambulatory Visit (HOSPITAL_BASED_OUTPATIENT_CLINIC_OR_DEPARTMENT_OTHER): Payer: Medicaid Other | Admitting: Hematology and Oncology

## 2016-03-12 DIAGNOSIS — I10 Essential (primary) hypertension: Secondary | ICD-10-CM

## 2016-03-12 DIAGNOSIS — E119 Type 2 diabetes mellitus without complications: Secondary | ICD-10-CM | POA: Diagnosis not present

## 2016-03-12 DIAGNOSIS — D638 Anemia in other chronic diseases classified elsewhere: Secondary | ICD-10-CM | POA: Diagnosis not present

## 2016-03-12 DIAGNOSIS — B999 Unspecified infectious disease: Secondary | ICD-10-CM | POA: Diagnosis present

## 2016-03-12 DIAGNOSIS — I5032 Chronic diastolic (congestive) heart failure: Secondary | ICD-10-CM | POA: Diagnosis not present

## 2016-03-12 LAB — LYME AB/WESTERN BLOT REFLEX

## 2016-03-12 NOTE — Progress Notes (Signed)
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 Shane [name] had mild iron deficiency was in the hospital and received IV iron treatment. Shane [name] was found to have severe deficiency of erythropoietin. During the hospitalization his hemoglobin had gone down to 6.7. 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 With iron infusion and treatment of his infection is hemoglobin went up to 10.9. Shane [name] came here for a posthospitalization follow-up for his anemia. Patient complains of fatigue  REVIEW OF SYSTEMS:   Constitutional: Denies fevers, chills or abnormal weight loss Eyes: Denies blurriness of vision Ears, nose, mouth, throat, and face: Denies mucositis or sore throat Respiratory: Denies cough, dyspnea or wheezes Cardiovascular: Denies palpitation, chest discomfort Gastrointestinal:  Denies nausea, heartburn or change in bowel habits Skin: Denies abnormal skin rashes Lymphatics: Denies new lymphadenopathy or easy bruising Neurological:Denies numbness, tingling or new weaknesses Behavioral/Psych: Mood is stable, no new changes  Extremities: Lower extremity ulcers  All other systems were reviewed with the patient and are negative.  I have reviewed the past medical history, past surgical history, social history and family history with the patient and they are unchanged from previous note.  ALLERGIES:  is allergic to other and milk-related compounds.  MEDICATIONS:  Current Outpatient Prescriptions  Medication Sig Dispense Refill  . amLODipine (NORVASC) 10 MG tablet Take 1 tablet (10 mg total) by mouth daily. 30 tablet 0  . aspirin EC 325 MG tablet Take 1 tablet (325 mg total) by mouth daily. 30 tablet 0  . atorvastatin (LIPITOR) 80 MG tablet Take 1  tablet (80 mg total) by mouth every morning. 30 tablet 5  . carvedilol (COREG) 12.5 MG tablet TAKE 1 TABLET BY MOUTH 2 TIMES DAILY WITH A MEAL 60 tablet 3  . divalproex (DEPAKOTE) 500 MG DR tablet Take 2 tablets (1,000 mg total) by mouth at bedtime. 30 tablet 5  . doxycycline (VIBRA-TABS) 100 MG tablet Take 1 tablet (100 mg total) by mouth 2 (two) times daily. 14 tablet 0  . ferrous sulfate 325 (65 FE) MG tablet Take 1 tablet (325 mg total) by mouth 2 (two) times daily with a meal. (Patient taking differently: Take 325 mg by mouth daily with breakfast. ) 60 tablet 5  . FLUoxetine (PROZAC) 10 MG tablet Take 2 tablets (20 mg total) by mouth every morning. 30 tablet 5  . gabapentin (NEURONTIN) 100 MG capsule Take 2 capsules (200 mg total) by mouth 3 (three) times daily. 90 capsule 0  . glipiZIDE (GLIPIZIDE XL) 10 MG 24 hr tablet Take 1 tablet (10 mg total) by mouth daily with breakfast. 90 tablet 3  . glucose blood (TRUE METRIX BLOOD GLUCOSE TEST) test strip Use as instructed 100 each 12  . HYDROcodone-acetaminophen (NORCO/VICODIN) 5-325 MG tablet Take 1 tablet by mouth every 6 (six) hours as needed for moderate pain. 12 tablet 0  . ibuprofen (ADVIL,MOTRIN) 800 MG tablet TAKE 1 TABLET BY MOUTH EVERY 8 HOURS AS NEEDED. (Patient taking differently: TAKE 1 TABLET BY MOUTH EVERY 8 HOURS AS NEEDED FOR PAIN) 30 tablet 0  . Insulin Glargine (LANTUS SOLOSTAR) 100 UNIT/ML Solostar Pen Inject 10 Units into the skin daily. 1 pen 5  . Insulin Pen Needle (B-D ULTRAFINE III SHORT PEN) 31G X 8 MM MISC 1 application by Does not apply  route daily. 30 each 11  . lisinopril (PRINIVIL,ZESTRIL) 40 MG tablet Take 1 tablet (40 mg total) by mouth daily. 30 tablet 5  . metFORMIN (GLUCOPHAGE) 1000 MG tablet Take 1 tablet (1,000 mg total) by mouth 2 (two) times daily with a meal. 60 tablet 5  . ondansetron (ZOFRAN ODT) 8 MG disintegrating tablet Take 1 tablet (8 mg total) by mouth every 8 (eight) hours as needed for nausea or  vomiting. 30 tablet 0  . pantoprazole (PROTONIX) 40 MG tablet Take 1 tablet (40 mg total) by mouth daily. 30 tablet 5  . saccharomyces boulardii (FLORASTOR) 250 MG capsule Take 1 capsule (250 mg total) by mouth 2 (two) times daily. 60 capsule 3  . silver sulfADIAZINE (SILVADENE) 1 % cream Apply topically daily. 50 g 0  . traZODone (DESYREL) 100 MG tablet Take 1 tablet (100 mg total) by mouth at bedtime. 30 tablet 5  . TRUEPLUS LANCETS 28G MISC Use as directed 100 each 12  . Vitamin D, Ergocalciferol, (DRISDOL) 50000 units CAPS capsule Take 1 capsule (50,000 Units total) by mouth every 30 (thirty) days. (Patient taking differently: Take 50,000 Units by mouth every 30 (thirty) days. Every 30 days) 12 capsule 0   No current facility-administered medications for this visit.     PHYSICAL EXAMINATION: ECOG PERFORMANCE STATUS: 1 - Symptomatic but completely ambulatory  There were no vitals filed for this visit. There were no vitals filed for this visit.  GENERAL:alert, no distress and comfortable SKIN: skin color, texture, turgor are normal, no rashes or significant lesions EYES: normal, Conjunctiva are pink and non-injected, sclera clear OROPHARYNX:no exudate, no erythema and lips, buccal mucosa, and tongue normal  NECK: supple, thyroid normal size, non-tender, without nodularity LYMPH:  no palpable lymphadenopathy in the cervical, axillary or inguinal LUNGS: clear to auscultation and percussion with normal breathing effort HEART: regular rate & rhythm and no murmurs and no lower extremity edema ABDOMEN:abdomen soft, non-tender and normal bowel sounds MUSCULOSKELETAL:no cyanosis of digits and no clubbing  NEURO: alert & oriented x 3 with fluent speech, no focal motor/sensory deficits EXTREMITIES: Bilateral lower extremity bandages  LABORATORY DATA:  I have reviewed the data as listed   Chemistry      Component Value Date/Time   NA 140 03/08/2016 1057   K 4.7 03/08/2016 1057   CL 103  03/08/2016 1057   CO2 23 03/08/2016 1049   BUN 25 (H) 03/08/2016 1057   CREATININE 1.20 03/08/2016 1057   CREATININE 0.80 09/08/2015 0929      Component Value Date/Time   CALCIUM 9.1 03/08/2016 1049   ALKPHOS 54 03/08/2016 1049   AST 17 03/08/2016 1049   ALT 12 (L) 03/08/2016 1049   BILITOT 0.2 (L) 03/08/2016 1049       Lab Results  Component Value Date   WBC 6.0 03/08/2016   HGB 10.9 (L) 03/08/2016   HCT 32.0 (L) 03/08/2016   MCV 94.9 03/08/2016   PLT 241 03/08/2016   NEUTROABS 3.9 03/08/2016   On ASSESSMENT & PLAN:  Anemia of infection and chronic disease Patient with diabetes mellitus, essential hypertension, CHF, recent left foot cellulitis and abscess Hospitalization 01/11/2016 to 01/23/2016 Upper endoscopy and colonoscopy were normal Cardiac stress test 01/20/2016 was negative for ischemia Jehovah's Witness Iron studies reveal moderate iron deficiency In the hospital and Shane [name] received IV iron and is currently on oral iron. Erythropoietin level is only 17, profound erythropoietin deficiency  Labs 03/08/2016: Hemoglobin 10.9, normal WBC and normal platelet counts  Plan:  1. Follow-up with primary care physician. 2. if his hemoglobin drops below 10 g, Shane [name] could be considered for Aranesp 300 g subcutaneous every 3 weeks  Return to clinic as needed.   No orders of the defined types were placed in this encounter.  The patient has a good understanding of the overall plan. Shane [name] agrees with it. Shane [name] will call with any problems that may develop before the next visit here.   Rulon Eisenmenger, MD 03/12/16

## 2016-03-12 NOTE — Assessment & Plan Note (Signed)
Patient with diabetes mellitus, essential hypertension, CHF, recent left foot cellulitis and abscess Hospitalization 01/11/2016 to 01/23/2016 Upper endoscopy and colonoscopy were normal Cardiac stress test 01/20/2016 was negative for ischemia [employer]'s Witness Iron studies reveal moderate iron deficiency In the hospital and he received IV iron and is currently on oral iron. Erythropoietin level is only 17, profound erythropoietin deficiency  Labs from today: Hemoglobin 9.8, reticulocyte count 33.48, 1%, ferritin 560, iron saturation 28%, normal WBC and normal platelet counts  Plan: Aranesp 300 g subcutaneous every 3 weeks Return to clinic in 3 weeks for second injection. I will see the patient every 6 weeks for follow-up.

## 2016-03-18 ENCOUNTER — Ambulatory Visit (INDEPENDENT_AMBULATORY_CARE_PROVIDER_SITE_OTHER): Payer: Medicaid Other | Admitting: Orthopedic Surgery

## 2016-03-18 ENCOUNTER — Telehealth: Payer: Self-pay | Admitting: Family Medicine

## 2016-03-18 DIAGNOSIS — L97411 Non-pressure chronic ulcer of right heel and midfoot limited to breakdown of skin: Secondary | ICD-10-CM

## 2016-03-18 DIAGNOSIS — E1142 Type 2 diabetes mellitus with diabetic polyneuropathy: Secondary | ICD-10-CM

## 2016-03-18 NOTE — Telephone Encounter (Signed)
Patient would like lab results. °Please follow up. ° °

## 2016-03-19 NOTE — Telephone Encounter (Signed)
Pt was called on 10/03 and informed of negative lyme disease resutls, other results are still pending.

## 2016-03-25 ENCOUNTER — Ambulatory Visit: Payer: Medicaid Other | Attending: Family Medicine | Admitting: Family Medicine

## 2016-03-25 ENCOUNTER — Encounter: Payer: Self-pay | Admitting: Family Medicine

## 2016-03-25 VITALS — BP 112/66 | HR 75 | Temp 97.9°F | Ht 68.0 in | Wt 186.8 lb

## 2016-03-25 DIAGNOSIS — IMO0002 Reserved for concepts with insufficient information to code with codable children: Secondary | ICD-10-CM

## 2016-03-25 DIAGNOSIS — E1165 Type 2 diabetes mellitus with hyperglycemia: Secondary | ICD-10-CM | POA: Insufficient documentation

## 2016-03-25 DIAGNOSIS — F419 Anxiety disorder, unspecified: Secondary | ICD-10-CM | POA: Insufficient documentation

## 2016-03-25 DIAGNOSIS — F319 Bipolar disorder, unspecified: Secondary | ICD-10-CM | POA: Diagnosis not present

## 2016-03-25 DIAGNOSIS — E118 Type 2 diabetes mellitus with unspecified complications: Secondary | ICD-10-CM | POA: Diagnosis not present

## 2016-03-25 DIAGNOSIS — Z79899 Other long term (current) drug therapy: Secondary | ICD-10-CM | POA: Insufficient documentation

## 2016-03-25 DIAGNOSIS — E11621 Type 2 diabetes mellitus with foot ulcer: Secondary | ICD-10-CM | POA: Diagnosis not present

## 2016-03-25 DIAGNOSIS — F418 Other specified anxiety disorders: Secondary | ICD-10-CM

## 2016-03-25 DIAGNOSIS — H9311 Tinnitus, right ear: Secondary | ICD-10-CM | POA: Diagnosis not present

## 2016-03-25 DIAGNOSIS — Z794 Long term (current) use of insulin: Secondary | ICD-10-CM | POA: Diagnosis not present

## 2016-03-25 DIAGNOSIS — Z7982 Long term (current) use of aspirin: Secondary | ICD-10-CM | POA: Insufficient documentation

## 2016-03-25 LAB — GLUCOSE, POCT (MANUAL RESULT ENTRY): POC GLUCOSE: 108 mg/dL — AB (ref 70–99)

## 2016-03-25 MED ORDER — ASPIRIN EC 81 MG PO TBEC: 81.0000 mg | DELAYED_RELEASE_TABLET | Freq: Every day | ORAL | 11 refills | Status: DC

## 2016-03-25 MED ORDER — FLUOXETINE HCL 20 MG PO TABS: 20.0000 mg | ORAL_TABLET | ORAL | 5 refills | Status: DC

## 2016-03-25 MED FILL — PANTOPRAZOLE SOD DR 40 MG T: 40 | 30 days supply | Qty: 30 | Fill #1

## 2016-03-25 MED FILL — AMLODIPINE BESYLATE 5 MG TA: 5 | 30 days supply | Qty: 30 | Fill #4

## 2016-03-25 NOTE — Patient Instructions (Addendum)
Ryyan was seen today for follow-up and diabetes.  Diagnoses and all orders for this visit:  Uncontrolled type 2 diabetes mellitus with complication, without long-term current use of insulin (HCC) -     POCT glucose (manual entry) -     Ambulatory referral to Ophthalmology -     aspirin EC 81 MG tablet; Take 1 tablet (81 mg total) by mouth daily.  Depression with anxiety -     FLUoxetine (PROZAC) 20 MG tablet; Take 1 tablet (20 mg total) by mouth every morning.  Buzzing in ear, right -     Ambulatory referral to Audiology   F/u in 6 weeks for foot ulcers   Dr. Adrian Blackwater

## 2016-03-25 NOTE — Progress Notes (Signed)
Pt has been getting a buzzing sound in right ear.  Pt needs correct instruction on prozac medication, and pt wants to know if you can proscribe Asprin.  Flu complete.

## 2016-03-25 NOTE — Progress Notes (Signed)
Subjective:  Patient ID: Shane [name], male    DOB: 1970/12/17  Age: 45 y.o. MRN: [id number]  CC: Follow-up and Diabetes   HPI Shivan Enerson has diabetes, bipolar depression, anxiety he presents for   1. ? 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 He is concerned about lyme disease due to fatigue, chest pains, joint pain and intermittent neuropathies without evidence of CAD or CVA. He has blood work done last visit. Lyme disease titers were negative.   2. Diabetic foot ulcers : healing. He is still on doxycyline. He follow up with orthopaedic surgery for wound care every 2 weeks. He numbness in feet so denies pain. Scant bleeding for ulcers.   3. Ringing in R ear: with decreased hearing for past month of so. No ear pain or dizziness. Normal hearing in L ear. Denies excessive ASA or NSAID use.    Social History  Substance Use Topics  . Smoking status: Never Smoker  . Smokeless tobacco: Never Used  . Alcohol use No    Outpatient Medications Prior to Visit  Medication Sig Dispense Refill  . amLODipine (NORVASC) 10 MG tablet Take 1 tablet (10 mg total) by mouth daily. 30 tablet 0  . aspirin EC 325 MG tablet Take 1 tablet (325 mg total) by mouth daily. 30 tablet 0  . atorvastatin (LIPITOR) 80 MG tablet Take 1 tablet (80 mg total) by mouth every morning. 30 tablet 5  . carvedilol (COREG) 12.5 MG tablet TAKE 1 TABLET BY MOUTH 2 TIMES DAILY WITH A MEAL 60 tablet 3  . divalproex (DEPAKOTE) 500 MG DR tablet Take 2 tablets (1,000 mg total) by mouth at bedtime. 30 tablet 5  . doxycycline (VIBRA-TABS) 100 MG tablet Take 1 tablet (100 mg total) by mouth 2 (two) times daily. 14 tablet 0  . ferrous sulfate 325 (65 FE) MG tablet Take 1 tablet (325 mg total) by mouth 2 (two) times daily with a meal. (Patient taking differently: Take 325 mg by mouth daily with breakfast. ) 60 tablet 5  . FLUoxetine (PROZAC) 10 MG tablet Take 2 tablets (20 mg  total) by mouth every morning. 30 tablet 5  . gabapentin (NEURONTIN) 100 MG capsule Take 2 capsules (200 mg total) by mouth 3 (three) times daily. 90 capsule 0  . glipiZIDE (GLIPIZIDE XL) 10 MG 24 hr tablet Take 1 tablet (10 mg total) by mouth daily with breakfast. 90 tablet 3  . glucose blood (TRUE METRIX BLOOD GLUCOSE TEST) test strip Use as instructed 100 each 12  . HYDROcodone-acetaminophen (NORCO/VICODIN) 5-325 MG tablet Take 1 tablet by mouth every 6 (six) hours as needed for moderate pain. 12 tablet 0  . ibuprofen (ADVIL,MOTRIN) 800 MG tablet TAKE 1 TABLET BY MOUTH EVERY 8 HOURS AS NEEDED. (Patient taking differently: TAKE 1 TABLET BY MOUTH EVERY 8 HOURS AS NEEDED FOR PAIN) 30 tablet 0  . Insulin Glargine (LANTUS SOLOSTAR) 100 UNIT/ML Solostar Pen Inject 10 Units into the skin daily. 1 pen 5  . Insulin Pen Needle (B-D ULTRAFINE III SHORT PEN) 31G X 8 MM MISC 1 application by Does not apply route daily. 30 each 11  . lisinopril (PRINIVIL,ZESTRIL) 40 MG tablet Take 1 tablet (40 mg total) by mouth daily. 30 tablet 5  . metFORMIN (GLUCOPHAGE) 1000 MG tablet Take 1 tablet (1,000 mg total) by mouth 2 (two) times daily with a meal. 60 tablet 5  . ondansetron (ZOFRAN ODT) 8 MG  disintegrating tablet Take 1 tablet (8 mg total) by mouth every 8 (eight) hours as needed for nausea or vomiting. 30 tablet 0  . pantoprazole (PROTONIX) 40 MG tablet Take 1 tablet (40 mg total) by mouth daily. 30 tablet 5  . saccharomyces boulardii (FLORASTOR) 250 MG capsule Take 1 capsule (250 mg total) by mouth 2 (two) times daily. 60 capsule 3  . silver sulfADIAZINE (SILVADENE) 1 % cream Apply topically daily. 50 g 0  . traZODone (DESYREL) 100 MG tablet Take 1 tablet (100 mg total) by mouth at bedtime. 30 tablet 5  . TRUEPLUS LANCETS 28G MISC Use as directed 100 each 12  . Vitamin D, Ergocalciferol, (DRISDOL) 50000 units CAPS capsule Take 1 capsule (50,000 Units total) by mouth every 30 (thirty) days. (Patient taking  differently: Take 50,000 Units by mouth every 30 (thirty) days. Every 30 days) 12 capsule 0   No facility-administered medications prior to visit.     ROS Review of Systems  Constitutional: Positive for fatigue. Negative for chills, fever and unexpected weight change.  HENT: Positive for hearing loss and tinnitus.   Eyes: Negative for visual disturbance.  Respiratory: Positive for chest tightness. Negative for cough and shortness of breath.   Cardiovascular: Negative for chest pain, palpitations and leg swelling.  Gastrointestinal: Negative for abdominal pain, blood in stool, constipation, diarrhea, nausea and vomiting.  Endocrine: Negative for polydipsia, polyphagia and polyuria.  Musculoskeletal: Positive for arthralgias. Negative for back pain, gait problem, joint swelling, myalgias and neck pain.  Skin: Positive for wound. Negative for rash.  Allergic/Immunologic: Negative for immunocompromised state.  Neurological: Negative for dizziness and numbness.  Hematological: Negative for adenopathy. Does not bruise/bleed easily.  Psychiatric/Behavioral: Negative for dysphoric mood, sleep disturbance and suicidal ideas. The patient is not nervous/anxious.     Objective:  BP 112/66 (BP Location: Right Arm, Patient Position: Sitting, Cuff Size: Small)   Pulse 75   Temp 97.9 F (36.6 C) (Oral)   Ht 5\' 8"  (1.727 m)   Wt 186 lb 12.8 oz (84.7 kg)   SpO2 100%   BMI 28.40 kg/m   BP/Weight 03/25/2016 03/12/2016 0000000  Systolic BP [id number] [id number]  Diastolic BP 66 80 69  Wt. (Lbs) 186.8 183.2 184.6  BMI 28.4 27.86 28.07    Physical Exam  Constitutional: He appears well-developed and well-nourished. No distress.  HENT:  Head: Normocephalic and atraumatic.  Right Ear: Tympanic membrane, external ear and ear canal normal.  Left Ear: Tympanic membrane, external ear and ear canal normal.  Neck: Normal range of motion. Neck supple.  Cardiovascular: Normal rate, regular rhythm, normal heart  sounds and intact distal pulses.   Pulses:      Femoral pulses are 1+ on the right side, and 1+ on the left side.      Popliteal pulses are 1+ on the right side, and 1+ on the left side.       Dorsalis pedis pulses are 1+ on the right side, and 1+ on the left side.       Posterior tibial pulses are 1+ on the right side, and 1+ on the left side.  Pulmonary/Chest: Effort normal and breath sounds normal.  Musculoskeletal: He exhibits no edema.       Feet:  Neurological: He is alert.  Skin: Skin is warm and dry. No rash noted. No erythema.  Psychiatric: He has a normal mood and affect.   Lab Results  Component Value Date   HGBA1C 7.8 (H) 03/09/2016  CBG 108   Assessment & Plan:  Lilbern was seen today for follow-up and diabetes.  Diagnoses and all orders for this visit:  Uncontrolled type 2 diabetes mellitus with complication, without long-term current use of insulin (HCC) -     POCT glucose (manual entry) -     Ambulatory referral to Ophthalmology -     aspirin EC 81 MG tablet; Take 1 tablet (81 mg total) by mouth daily.  Depression with anxiety -     FLUoxetine (PROZAC) 20 MG tablet; Take 1 tablet (20 mg total) by mouth every morning.  Buzzing in ear, right -     Ambulatory referral to Audiology   There are no diagnoses linked to this encounter.  No orders of the defined types were placed in this encounter.   Follow-up: Return in about 6 weeks (around 05/06/2016) for foot ulcers .   Boykin Nearing MD

## 2016-03-26 DIAGNOSIS — H9311 Tinnitus, right ear: Secondary | ICD-10-CM | POA: Insufficient documentation

## 2016-03-26 NOTE — Assessment & Plan Note (Signed)
Buzzing in ear, normal exam Audiology  referral placed

## 2016-04-01 ENCOUNTER — Ambulatory Visit (INDEPENDENT_AMBULATORY_CARE_PROVIDER_SITE_OTHER): Payer: Medicaid Other | Admitting: Orthopedic Surgery

## 2016-04-01 DIAGNOSIS — M86272 Subacute osteomyelitis, left ankle and foot: Secondary | ICD-10-CM

## 2016-04-01 DIAGNOSIS — L02611 Cutaneous abscess of right foot: Secondary | ICD-10-CM

## 2016-04-01 DIAGNOSIS — E1142 Type 2 diabetes mellitus with diabetic polyneuropathy: Secondary | ICD-10-CM

## 2016-04-06 ENCOUNTER — Other Ambulatory Visit (HOSPITAL_COMMUNITY): Payer: Self-pay | Admitting: Family

## 2016-04-09 ENCOUNTER — Ambulatory Visit (INDEPENDENT_AMBULATORY_CARE_PROVIDER_SITE_OTHER): Payer: Medicaid Other | Admitting: Orthopedic Surgery

## 2016-04-09 ENCOUNTER — Encounter (HOSPITAL_COMMUNITY): Payer: Self-pay | Admitting: *Deleted

## 2016-04-09 ENCOUNTER — Encounter (INDEPENDENT_AMBULATORY_CARE_PROVIDER_SITE_OTHER): Payer: Self-pay | Admitting: Orthopedic Surgery

## 2016-04-09 DIAGNOSIS — M86172 Other acute osteomyelitis, left ankle and foot: Secondary | ICD-10-CM | POA: Diagnosis not present

## 2016-04-09 NOTE — Progress Notes (Signed)
Wound Care Note   Patient: Shane [name]           Date of Birth: 1971/03/07           MRN: [id number]             PCP: Minerva Ends, MD Visit Date: 04/09/2016   Assessment & Plan: Visit Diagnoses:  1. Acute osteomyelitis of toe of left foot ([place])     Plan: Plan for amputation left foot second toe tomorrow. Discussed with an abscess is extending down to the third toe that we would need to consider amputation of the second and third toe. The abrasion on the right foot appears stable and no surgical intervention necessary.   Follow-Up Instructions: Return in about 1 week (around 04/16/2016).  Orders:  No orders of the defined types were placed in this encounter.  Meds ordered this encounter  Medications  . Escitalopram Oxalate (LEXAPRO PO)    Sig: Take by mouth.      Procedures: No notes on file   Clinical Data: No additional findings.   No images are attached to the encounter.   Subjective: Chief Complaint  Patient presents with  . Right Foot - Follow-up  . Follow-up    Right foot I&D abscess    Patient presents today with third toe pain beginning onset on 04/06/16 . He is actually schedule for left 2nd toe amputation at MTP joint. He states his toe is very tender to touch, with warmth and concerned about questionable infection. He had a similar feeling before a prior toe amputation. He also had a fall yesterday in the tub falling backward. He did hit right foot on faucet, and had bleeding for wound. He is also here today wanting to follow up on right foot I/D abscess on 01/17/16. He is 12 weeks post op from this. There is healthy granulation tissue at wound bed. He is wearing the medicated vive compression stockings bilaterally.     Review of Systems  Miscellaneous:  -Home Health Care: [facility] coming twice a week for wound monitoring  -Physical Therapy: no  -Out of Work?: yes  Objective: Vital Signs: There were no vitals taken for this visit.  Physical  Exam: On examination patient has an abrasion over the wound dorsum of the right foot. There is good callus tissue no cellulitis no exposed tendon or bone no signs of infection. Patient does have swelling and pain in the third toe left foot there is no signs of cellulitis no signs of infection radiographs were reviewed which shows no evidence of osteomyelitis in the third toe. Patient does have osteomyelitis in the second toe.  Specialty Comments: No specialty comments available.   PMFS History: Patient Active Problem List   Diagnosis Date Noted  . Buzzing in ear, right 03/26/2016  . Joint pain 03/11/2016  . Weakness 03/08/2016  . Dizziness 03/03/2016  . Diabetic ulcer of right foot associated with type 2 diabetes mellitus, with necrosis of muscle ([place]) 02/21/2016  . Anemia, iron deficiency   . Chronic diastolic CHF (congestive heart failure), NYHA class 1 ([place]) 12/30/2015  . Diabetes mellitus type 2, uncontrolled, with complications ([place]) [id number]  . Depression with anxiety 12/01/2015  . Pain in the chest 12/01/2015  . Insomnia 11/21/2015  . GERD (gastroesophageal reflux disease) 08/18/2015  . Amputated toe of left foot ([place]) 08/18/2015  . Erectile dysfunction 07/04/2015  . Anemia of infection and chronic disease 04/24/2015  . Left arm weakness 04/02/2015  . Sensory disturbance  04/02/2015  . Essential hypertension 04/02/2015  . Hemispheric carotid artery syndrome   . HLD (hyperlipidemia)   . Headache    Past Medical History:  Diagnosis Date  . Anemia   . Bipolar disorder ([place])   . Chest pain    a. 2015 Reportedly normal stress test in [place].  Marland Kitchen Chronic diastolic CHF (congestive heart failure) (HCC)    a.03/2015 Echo: EF 55-60%, Gr 1 DD, mild MR, triv PR.  . Depression   . Depression with anxiety   . Dyspnea    with exertion  . GERD (gastroesophageal reflux disease)   . Hyperlipidemia   . Hypertension    a. 08/2014 Admitted with hypertensive urgency.  . Insomnia   .  Internal carotid artery stenosis   . Lower GI bleed 07/04/2015  . Neuropathy ([place])   . Refusal of blood transfusions as patient is Jehovah's Witness   . TIA (transient ischemic attack) 08/2014; 03/2015   a. 08/2014 in setting of hypertensive urgency.  . Type II diabetes mellitus ([place])    started insulin spring 2016, Type II  . Vitamin D deficiency spring 2016    Family History  Problem Relation Age of Onset  . Hypertension Mother   . Diabetes Mother   . Hyperlipidemia Mother   . Heart disease Mother     s/p pacemaker  . Diabetes Father   . Hypertension Father   . Stroke Father   . Heart attack Father     first MI @ 17.  . Stroke Brother    Past Surgical History:  Procedure Laterality Date  . AMPUTATION Left 08/03/2015   Procedure: AMPUTATION LEFT GREAT TOE;  Surgeon: Newt Minion, MD;  Location: [place];  Service: Orthopedics;  Laterality: Left;  . AMPUTATION Left 12/02/2015   Procedure: amputation of left 2nd digit  . APPLICATION OF WOUND VAC Left 12/19/2015   Procedure: APPLICATION OF WOUND VAC;  Surgeon: Meredith Pel, MD;  Location: [place];  Service: Orthopedics;  Laterality: Left;  . CIRCUMCISION    . COLONOSCOPY N/A 01/22/2016   Procedure: COLONOSCOPY;  Surgeon: Gatha Mayer, MD;  Location: [place];  Service: Endoscopy;  Laterality: N/A;  . ESOPHAGOGASTRODUODENOSCOPY N/A 01/19/2016   Procedure: ESOPHAGOGASTRODUODENOSCOPY (EGD);  Surgeon: Manus Gunning, MD;  Location: [place];  Service: Gastroenterology;  Laterality: N/A;  . ESOPHAGOGASTRODUODENOSCOPY N/A 01/22/2016   Procedure: ESOPHAGOGASTRODUODENOSCOPY (EGD);  Surgeon: Gatha Mayer, MD;  Location[facility] ENDOSCOPY;  Service: Endoscopy;  Laterality: N/A;  . I&D EXTREMITY Left 12/02/2015   Procedure: IRRIGATION AND DEBRIDEMENT OF FOOT; LEFT SECOND TOE AMPUTATION;  Surgeon: Meredith Pel, MD;  Location: [place];  Service: Orthopedics;  Laterality: Left;  . I&D EXTREMITY Left 12/19/2015   Procedure: I & D LEFT  FOOT WITH BEADS ;  Surgeon: Meredith Pel, MD;  Location: [place];  Service: Orthopedics;  Laterality: Left;  . I&D EXTREMITY Right 01/17/2016   Procedure: IRRIGATION AND DEBRIDEMENT RIGHT FOOT;  Surgeon: Newt Minion, MD;  Location: [place];  Service: Orthopedics;  Laterality: Right;  . INCISION AND DRAINAGE FOOT Right 01/17/2016  . INGUINAL HERNIA REPAIR Bilateral ~ 1983- ~ 1986  . TONSILLECTOMY  ~ 95   Social History   Occupational History  . Not on file.   Social History Main Topics  . Smoking status: Never Smoker  . Smokeless tobacco: Never Used  . Alcohol use No  . Drug use: No  . Sexual activity: No

## 2016-04-09 NOTE — Progress Notes (Signed)
Shane [name] reported that CBG was 115 this am, but was 298 last night.  I encouraged patient to check CBG more frequently to see if something he was eating is making CBG be so high..  I instructed patient to take 1/2 of Lantus this evening, 5 units. I instructed patient to check CBG to check CBG and if it is less than 70 to treat it with Glucose Gel, Glucose tablets or 1/2 cup of clear juice like apple juice or cranberry juice, or 1/2 cup of regular soda. (not cream soda). I instructed patient to recheck CBG in 15 minutes and if CBG is not greater than 70, to  Call [phone] (pre- op). If it is before pre-op opens to retreat as before and recheck CBG in 15 minutes. I told patient to make note of time that liquid is taken and amount, that surgical time may have to be adjusted.   Shane [name] reports that he had chest pain in 02/2016 and was seen at [facility], patient does not see a cardiologist and has had not further chest pain.

## 2016-04-10 ENCOUNTER — Encounter (HOSPITAL_COMMUNITY): Payer: Self-pay | Admitting: *Deleted

## 2016-04-10 ENCOUNTER — Ambulatory Visit (HOSPITAL_COMMUNITY): Payer: Medicaid Other | Admitting: Anesthesiology

## 2016-04-10 ENCOUNTER — Ambulatory Visit (HOSPITAL_COMMUNITY)
Admission: RE | Admit: 2016-04-10 | Discharge: 2016-04-10 | Disposition: A | Discharge status: Home / Self Care | Payer: Medicaid Other | Source: Ambulatory Visit | Attending: Orthopedic Surgery | Admitting: Orthopedic Surgery

## 2016-04-10 ENCOUNTER — Encounter (HOSPITAL_COMMUNITY)
Admission: RE | Disposition: A | Discharge status: Home / Self Care | Payer: Self-pay | Source: Ambulatory Visit | Attending: Orthopedic Surgery

## 2016-04-10 DIAGNOSIS — E785 Hyperlipidemia, unspecified: Secondary | ICD-10-CM | POA: Diagnosis not present

## 2016-04-10 DIAGNOSIS — M869 Osteomyelitis, unspecified: Secondary | ICD-10-CM | POA: Diagnosis not present

## 2016-04-10 DIAGNOSIS — F419 Anxiety disorder, unspecified: Secondary | ICD-10-CM | POA: Insufficient documentation

## 2016-04-10 DIAGNOSIS — Z7982 Long term (current) use of aspirin: Secondary | ICD-10-CM | POA: Diagnosis not present

## 2016-04-10 DIAGNOSIS — I110 Hypertensive heart disease with heart failure: Secondary | ICD-10-CM | POA: Diagnosis not present

## 2016-04-10 DIAGNOSIS — M868X7 Other osteomyelitis, ankle and foot: Secondary | ICD-10-CM | POA: Insufficient documentation

## 2016-04-10 DIAGNOSIS — F319 Bipolar disorder, unspecified: Secondary | ICD-10-CM | POA: Diagnosis not present

## 2016-04-10 DIAGNOSIS — E11621 Type 2 diabetes mellitus with foot ulcer: Secondary | ICD-10-CM | POA: Diagnosis not present

## 2016-04-10 DIAGNOSIS — I5032 Chronic diastolic (congestive) heart failure: Secondary | ICD-10-CM | POA: Diagnosis not present

## 2016-04-10 DIAGNOSIS — I509 Heart failure, unspecified: Secondary | ICD-10-CM | POA: Insufficient documentation

## 2016-04-10 DIAGNOSIS — K219 Gastro-esophageal reflux disease without esophagitis: Secondary | ICD-10-CM | POA: Insufficient documentation

## 2016-04-10 DIAGNOSIS — I739 Peripheral vascular disease, unspecified: Secondary | ICD-10-CM | POA: Insufficient documentation

## 2016-04-10 DIAGNOSIS — G4730 Sleep apnea, unspecified: Secondary | ICD-10-CM | POA: Insufficient documentation

## 2016-04-10 DIAGNOSIS — Z8673 Personal history of transient ischemic attack (TIA), and cerebral infarction without residual deficits: Secondary | ICD-10-CM | POA: Diagnosis not present

## 2016-04-10 DIAGNOSIS — E1140 Type 2 diabetes mellitus with diabetic neuropathy, unspecified: Secondary | ICD-10-CM | POA: Insufficient documentation

## 2016-04-10 DIAGNOSIS — Z794 Long term (current) use of insulin: Secondary | ICD-10-CM | POA: Insufficient documentation

## 2016-04-10 DIAGNOSIS — Z89422 Acquired absence of other left toe(s): Secondary | ICD-10-CM | POA: Insufficient documentation

## 2016-04-10 DIAGNOSIS — E1169 Type 2 diabetes mellitus with other specified complication: Secondary | ICD-10-CM | POA: Insufficient documentation

## 2016-04-10 DIAGNOSIS — Z89412 Acquired absence of left great toe: Secondary | ICD-10-CM | POA: Diagnosis not present

## 2016-04-10 HISTORY — PX: AMPUTATION: SHX166

## 2016-04-10 HISTORY — DX: Dyspnea, unspecified: R06.00

## 2016-04-10 HISTORY — DX: Insomnia, unspecified: G47.00

## 2016-04-10 HISTORY — DX: Polyneuropathy, unspecified: G62.9

## 2016-04-10 LAB — COMPREHENSIVE METABOLIC PANEL
ALBUMIN: 3.2 g/dL — AB (ref 3.5–5.0)
ALT: 16 U/L — ABNORMAL LOW (ref 17–63)
ANION GAP: 7 (ref 5–15)
AST: 16 U/L (ref 15–41)
Alkaline Phosphatase: 54 U/L (ref 38–126)
BUN: 17 mg/dL (ref 6–20)
CHLORIDE: 105 mmol/L (ref 101–111)
CO2: 29 mmol/L (ref 22–32)
Calcium: 8.8 mg/dL — ABNORMAL LOW (ref 8.9–10.3)
Creatinine, Ser: 1.07 mg/dL (ref 0.61–1.24)
GFR calc Af Amer: >60 mL/min (ref >60)
GFR calc non Af Amer: >60 mL/min (ref >60)
GLUCOSE: 194 mg/dL — AB (ref 65–99)
POTASSIUM: 4.6 mmol/L (ref 3.5–5.1)
SODIUM: 141 mmol/L (ref 135–145)
TOTAL PROTEIN: 6.7 g/dL (ref 6.5–8.1)
Total Bilirubin: 0.4 mg/dL (ref 0.3–1.2)

## 2016-04-10 LAB — GLUCOSE, CAPILLARY
GLUCOSE-CAPILLARY: 170 mg/dL — AB (ref 65–99)
Glucose-Capillary: 176 mg/dL — ABNORMAL HIGH (ref 65–99)

## 2016-04-10 LAB — CBC
HEMATOCRIT: 32.2 % — AB (ref 39.0–52.0)
HEMOGLOBIN: 10.2 g/dL — AB (ref 13.0–17.0)
MCH: 29.1 pg (ref 26.0–34.0)
MCHC: 31.7 g/dL (ref 30.0–36.0)
MCV: 92 fL (ref 78.0–100.0)
Platelets: 217 10*3/uL (ref 150–400)
RBC: 3.5 MIL/uL — ABNORMAL LOW (ref 4.22–5.81)
RDW: 14.1 % (ref 11.5–15.5)
WBC: 6.5 10*3/uL (ref 4.0–10.5)

## 2016-04-10 SURGERY — AMPUTATION DIGIT
Anesthesia: General | Laterality: Left

## 2016-04-10 MED ORDER — 0.9 % SODIUM CHLORIDE (POUR BTL) OPTIME
TOPICAL | Status: DC | PRN
  Administered 2016-04-10: 1000 mL

## 2016-04-10 MED ORDER — CEFAZOLIN SODIUM-DEXTROSE 2-4 GM/100ML-% IV SOLN
2.0000 g | INTRAVENOUS | Status: AC
  Administered 2016-04-10: 2 g via INTRAVENOUS
  Filled 2016-04-10: qty 100

## 2016-04-10 MED ORDER — FENTANYL CITRATE (PF) 100 MCG/2ML IJ SOLN
INTRAMUSCULAR | Status: DC | PRN
  Administered 2016-04-10: 50 ug via INTRAVENOUS

## 2016-04-10 MED ORDER — FENTANYL CITRATE (PF) 100 MCG/2ML IJ SOLN
INTRAMUSCULAR | Status: AC
  Filled 2016-04-10: qty 2

## 2016-04-10 MED ORDER — ONDANSETRON HCL 4 MG/2ML IJ SOLN
INTRAMUSCULAR | Status: AC
  Filled 2016-04-10: qty 2

## 2016-04-10 MED ORDER — PROMETHAZINE HCL 25 MG/ML IJ SOLN: 6.2500 mg | INTRAMUSCULAR | Status: DC | PRN

## 2016-04-10 MED ORDER — CHLORHEXIDINE GLUCONATE 4 % EX LIQD: 60.0000 mL | Freq: Once | CUTANEOUS | Status: DC

## 2016-04-10 MED ORDER — MIDAZOLAM HCL 2 MG/2ML IJ SOLN
INTRAMUSCULAR | Status: AC
  Filled 2016-04-10: qty 2

## 2016-04-10 MED ORDER — ONDANSETRON HCL 4 MG/2ML IJ SOLN
INTRAMUSCULAR | Status: DC | PRN
  Administered 2016-04-10: 4 mg via INTRAVENOUS

## 2016-04-10 MED ORDER — LACTATED RINGERS IV SOLN
INTRAVENOUS | Status: DC
  Administered 2016-04-10 (×2): via INTRAVENOUS

## 2016-04-10 MED ORDER — PROPOFOL 10 MG/ML IV BOLUS
INTRAVENOUS | Status: DC | PRN
  Administered 2016-04-10: 150 mg via INTRAVENOUS

## 2016-04-10 MED ORDER — LIDOCAINE 2% (20 MG/ML) 5 ML SYRINGE
INTRAMUSCULAR | Status: AC
  Filled 2016-04-10: qty 10

## 2016-04-10 MED ORDER — PHENYLEPHRINE HCL 10 MG/ML IJ SOLN
INTRAMUSCULAR | Status: DC | PRN
  Administered 2016-04-10 (×3): 40 ug via INTRAVENOUS

## 2016-04-10 MED ORDER — OXYCODONE-ACETAMINOPHEN 5-325 MG PO TABS: 1.0000 | ORAL_TABLET | ORAL | 0 refills | Status: DC | PRN

## 2016-04-10 MED ORDER — HYDROMORPHONE HCL 1 MG/ML IJ SOLN: 0.2500 mg | INTRAMUSCULAR | Status: DC | PRN

## 2016-04-10 MED ORDER — LIDOCAINE HCL (CARDIAC) 20 MG/ML IV SOLN
INTRAVENOUS | Status: DC | PRN
Start: 2016-04-10 — End: 2016-04-10
  Administered 2016-04-10: 60 mg via INTRAVENOUS

## 2016-04-10 MED ORDER — MIDAZOLAM HCL 5 MG/5ML IJ SOLN
INTRAMUSCULAR | Status: DC | PRN
  Administered 2016-04-10: 2 mg via INTRAVENOUS

## 2016-04-10 SURGICAL SUPPLY — 32 items
BLADE SURG 21 STRL SS (BLADE) ×3 IMPLANT
BNDG COHESIVE 4X5 TAN STRL (GAUZE/BANDAGES/DRESSINGS) ×3 IMPLANT
BNDG GAUZE ELAST 4 BULKY (GAUZE/BANDAGES/DRESSINGS) ×3 IMPLANT
COVER SURGICAL LIGHT HANDLE (MISCELLANEOUS) ×3 IMPLANT
DRAPE U-SHAPE 47X51 STRL (DRAPES) ×3 IMPLANT
DRSG EMULSION OIL 3X3 NADH (GAUZE/BANDAGES/DRESSINGS) ×3 IMPLANT
DRSG PAD ABDOMINAL 8X10 ST (GAUZE/BANDAGES/DRESSINGS) ×3 IMPLANT
DURAPREP 26ML APPLICATOR (WOUND CARE) ×3 IMPLANT
ELECT REM PT RETURN 9FT ADLT (ELECTROSURGICAL) ×3
ELECTRODE REM PT RTRN 9FT ADLT (ELECTROSURGICAL) ×1 IMPLANT
GAUZE SPONGE 4X4 12PLY STRL (GAUZE/BANDAGES/DRESSINGS) ×3 IMPLANT
GLOVE BIOGEL PI IND STRL 6.5 (GLOVE) ×1 IMPLANT
GLOVE BIOGEL PI IND STRL 7.5 (GLOVE) ×1 IMPLANT
GLOVE BIOGEL PI IND STRL 9 (GLOVE) ×1 IMPLANT
GLOVE BIOGEL PI INDICATOR 6.5 (GLOVE) ×2
GLOVE BIOGEL PI INDICATOR 7.5 (GLOVE) ×2
GLOVE BIOGEL PI INDICATOR 9 (GLOVE) ×2
GLOVE SURG ORTHO 9.0 STRL STRW (GLOVE) ×3 IMPLANT
GLOVE SURG SS PI 6.0 STRL IVOR (GLOVE) ×3 IMPLANT
GLOVE SURG SS PI 6.5 STRL IVOR (GLOVE) ×3 IMPLANT
GOWN STRL REUS W/ TWL XL LVL3 (GOWN DISPOSABLE) ×2 IMPLANT
GOWN STRL REUS W/TWL XL LVL3 (GOWN DISPOSABLE) ×4
KIT BASIN OR (CUSTOM PROCEDURE TRAY) ×3 IMPLANT
KIT ROOM TURNOVER OR (KITS) ×3 IMPLANT
NS IRRIG 1000ML POUR BTL (IV SOLUTION) ×3 IMPLANT
PACK ORTHO EXTREMITY (CUSTOM PROCEDURE TRAY) ×3 IMPLANT
PAD ABD 8X10 STRL (GAUZE/BANDAGES/DRESSINGS) ×3 IMPLANT
PAD ARMBOARD 7.5X6 YLW CONV (MISCELLANEOUS) ×6 IMPLANT
SPONGE GAUZE 4X4 12PLY STER LF (GAUZE/BANDAGES/DRESSINGS) ×3 IMPLANT
SUT ETHILON 2 0 PSLX (SUTURE) ×3 IMPLANT
TOWEL OR 17X24 6PK STRL BLUE (TOWEL DISPOSABLE) ×3 IMPLANT
TOWEL OR 17X26 10 PK STRL BLUE (TOWEL DISPOSABLE) ×3 IMPLANT

## 2016-04-10 NOTE — Anesthesia Procedure Notes (Signed)
Procedure Name: LMA Insertion Date/Time: 04/10/2016 9:48 AM Performed by: Ollen Bowl Pre-anesthesia Checklist: Patient identified, Emergency Drugs available, Suction available, Patient being monitored and Timeout performed Patient Re-evaluated:Patient Re-evaluated prior to inductionOxygen Delivery Method: Circle system utilized and Simple face mask Preoxygenation: Pre-oxygenation with 100% oxygen Intubation Type: IV induction Ventilation: Mask ventilation without difficulty LMA: LMA inserted LMA Size: 5.0 Number of attempts: 1 Airway Equipment and Method: Patient positioned with wedge pillow Placement Confirmation: positive ETCO2 and breath sounds checked- equal and bilateral Tube secured with: Tape Dental Injury: Teeth and Oropharynx as per pre-operative assessment

## 2016-04-10 NOTE — Transfer of Care (Signed)
Immediate Anesthesia Transfer of Care Note  Patient: Shane [name]  Procedure(s) Performed: Procedure(s): Left 2nd Toe Amputation at MTP Joint (Left)  Patient Location: PACU  Anesthesia Type:General  Level of Consciousness: awake and alert   Airway & Oxygen Therapy: Patient Spontanous Breathing and Patient connected to nasal cannula oxygen  Post-op Assessment: Report given to RN and Post -op Vital signs reviewed and stable  Post vital signs: Reviewed and stable  Last Vitals:  Vitals:   04/10/16 0731 04/10/16 1011  BP: (!) 179/98   Pulse: 78   Resp: 20   Temp: 36.7 C 36.6 C    Last Pain:  Vitals:   04/10/16 1011  TempSrc:   PainSc: Asleep         Complications: No apparent anesthesia complications

## 2016-04-10 NOTE — Anesthesia Preprocedure Evaluation (Signed)
Anesthesia Evaluation  Patient identified by MRN, date of birth, ID band Patient awake    Reviewed: Allergy & Precautions, NPO status , Patient's Chart, lab work & pertinent test results  Airway Mallampati: I  TM Distance: >3 FB Neck ROM: Full    Dental  (+) Dental Advisory Given, Teeth Intact   Pulmonary sleep apnea ,    Pulmonary exam normal        Cardiovascular hypertension, Pt. on medications and Pt. on home beta blockers + Peripheral Vascular Disease and +CHF  Normal cardiovascular exam     Neuro/Psych  Headaches, Anxiety Bipolar Disorder TIA   GI/Hepatic Neg liver ROS, GERD  ,  Endo/Other  diabetes  Renal/GU negative Renal ROS     Musculoskeletal negative musculoskeletal ROS (+)   Abdominal   Peds  Hematology  (+) Blood dyscrasia, anemia , Hgb 6.9 - refusal of blood products   Anesthesia Other Findings   Reproductive/Obstetrics                             Lab Results  Component Value Date   WBC 6.5 04/10/2016   HGB 10.2 (L) 04/10/2016   HCT 32.2 (L) 04/10/2016   MCV 92.0 04/10/2016   PLT 217 04/10/2016   Lab Results  Component Value Date   CREATININE 1.07 04/10/2016   BUN 17 04/10/2016   NA 141 04/10/2016   K 4.6 04/10/2016   CL 105 04/10/2016   CO2 29 04/10/2016    Anesthesia Physical  Anesthesia Plan  ASA: III  Anesthesia Plan: General   Post-op Pain Management:    Induction: Intravenous  Airway Management Planned: LMA  Additional Equipment:   Intra-op Plan:   Post-operative Plan: Extubation in OR  Informed Consent: I have reviewed the patients History and Physical, chart, labs and discussed the procedure including the risks, benefits and alternatives for the proposed anesthesia with the patient or authorized representative who has indicated his/her understanding and acceptance.   Dental advisory given  Plan Discussed with: Surgeon and  CRNA  Anesthesia Plan Comments:         Anesthesia Quick Evaluation

## 2016-04-10 NOTE — H&P (Signed)
Shane [name] is an 45 y.o. male.   Chief Complaint: Osteomyelitis ulceration swelling left foot second toe HPI: Patient is a 50 shell gentleman diabetic insensate neuropathy status post great toe amputation who has had progressive cellulitis ulceration left foot second toe and has failed conservative care.  Past Medical History:  Diagnosis Date  . Anemia   . Bipolar disorder ([place])   . Chest pain    a. 2015 Reportedly normal stress test in [place].  Marland Kitchen Chronic diastolic CHF (congestive heart failure) (HCC)    a.03/2015 Echo: EF 55-60%, Gr 1 DD, mild MR, triv PR.  . Depression   . Depression with anxiety   . Dyspnea    with exertion  . GERD (gastroesophageal reflux disease)   . Hyperlipidemia   . Hypertension    a. 08/2014 Admitted with hypertensive urgency.  . Insomnia   . Internal carotid artery stenosis   . Lower GI bleed 07/04/2015  . Neuropathy ([place])   . Refusal of blood transfusions as patient is Jehovah's Witness   . TIA (transient ischemic attack) 08/2014; 03/2015   a. 08/2014 in setting of hypertensive urgency.  . Type II diabetes mellitus ([place])    started insulin spring 2016, Type II  . Vitamin D deficiency spring 2016    Past Surgical History:  Procedure Laterality Date  . AMPUTATION Left 08/03/2015   Procedure: AMPUTATION LEFT GREAT TOE;  Surgeon: Newt Minion, MD;  Location: [place];  Service: Orthopedics;  Laterality: Left;  . AMPUTATION Left 12/02/2015   Procedure: amputation of left 2nd digit  . APPLICATION OF WOUND VAC Left 12/19/2015   Procedure: APPLICATION OF WOUND VAC;  Surgeon: Meredith Pel, MD;  Location: [place];  Service: Orthopedics;  Laterality: Left;  . CIRCUMCISION    . COLONOSCOPY N/A 01/22/2016   Procedure: COLONOSCOPY;  Surgeon: Gatha Mayer, MD;  Location: [place];  Service: Endoscopy;  Laterality: N/A;  . ESOPHAGOGASTRODUODENOSCOPY N/A 01/19/2016   Procedure: ESOPHAGOGASTRODUODENOSCOPY (EGD);  Surgeon: Manus Gunning, MD;  Location: [place];  Service: Gastroenterology;  Laterality: N/A;  . ESOPHAGOGASTRODUODENOSCOPY N/A 01/22/2016   Procedure: ESOPHAGOGASTRODUODENOSCOPY (EGD);  Surgeon: Gatha Mayer, MD;  [facility] ENDOSCOPY;  Service: Endoscopy;  Laterality: N/A;  . I&D EXTREMITY Left 12/02/2015   Procedure: IRRIGATION AND DEBRIDEMENT OF FOOT; LEFT SECOND TOE AMPUTATION;  Surgeon: Meredith Pel, MD;  Location: [place];  Service: Orthopedics;  Laterality: Left;  . I&D EXTREMITY Left 12/19/2015   Procedure: I & D LEFT FOOT WITH BEADS ;  Surgeon: Meredith Pel, MD;  Location: [place];  Service: Orthopedics;  Laterality: Left;  . I&D EXTREMITY Right 01/17/2016   Procedure: IRRIGATION AND DEBRIDEMENT RIGHT FOOT;  Surgeon: Newt Minion, MD;  Location: [place];  Service: Orthopedics;  Laterality: Right;  . INCISION AND DRAINAGE FOOT Right 01/17/2016  . INGUINAL HERNIA REPAIR Bilateral ~ 1983- ~ 1986  . TONSILLECTOMY  ~ 31    Family History  Problem Relation Age of Onset  . Hypertension Mother   . Diabetes Mother   . Hyperlipidemia Mother   . Heart disease Mother     s/p pacemaker  . Diabetes Father   . Hypertension Father   . Stroke Father   . Heart attack Father     first MI @ 44.  . Stroke Brother    Social History:  reports that he has never smoked. He has never used smokeless tobacco. He reports that he does not drink alcohol or  use drugs.  Allergies:  Allergies  Allergen Reactions  . Other Other (See Comments)    Red meat causes stomach pains, bloating and diarrhea  . Milk-Related Compounds Diarrhea and Other (See Comments)    Any dairy products  - diarrhea and bloating    Medications Prior to Admission  Medication Sig Dispense Refill  . amLODipine (NORVASC) 10 MG tablet Take 1 tablet (10 mg total) by mouth daily. 30 tablet 0  . aspirin EC 81 MG tablet Take 1 tablet (81 mg total) by mouth daily. 30 tablet 11  . atorvastatin (LIPITOR) 80 MG tablet Take 1 tablet (80 mg total) by mouth every  morning. 30 tablet 5  . carvedilol (COREG) 12.5 MG tablet TAKE 1 TABLET BY MOUTH 2 TIMES DAILY WITH A MEAL 60 tablet 3  . divalproex (DEPAKOTE) 500 MG DR tablet Take 2 tablets (1,000 mg total) by mouth at bedtime. 30 tablet 5  . doxycycline (VIBRA-TABS) 100 MG tablet Take 1 tablet (100 mg total) by mouth 2 (two) times daily. 14 tablet 0  . ferrous sulfate 325 (65 FE) MG tablet Take 1 tablet (325 mg total) by mouth 2 (two) times daily with a meal. (Patient taking differently: Take 325 mg by mouth daily with breakfast. ) 60 tablet 5  . FLUoxetine (PROZAC) 20 MG tablet Take 1 tablet (20 mg total) by mouth every morning. 30 tablet 5  . gabapentin (NEURONTIN) 100 MG capsule Take 2 capsules (200 mg total) by mouth 3 (three) times daily. 90 capsule 0  . glipiZIDE (GLIPIZIDE XL) 10 MG 24 hr tablet Take 1 tablet (10 mg total) by mouth daily with breakfast. 90 tablet 3  . HYDROcodone-acetaminophen (NORCO/VICODIN) 5-325 MG tablet Take 1 tablet by mouth every 6 (six) hours as needed for moderate pain. 12 tablet 0  . ibuprofen (ADVIL,MOTRIN) 800 MG tablet TAKE 1 TABLET BY MOUTH EVERY 8 HOURS AS NEEDED. (Patient taking differently: TAKE 1 TABLET BY MOUTH EVERY 8 HOURS AS NEEDED FOR PAIN) 30 tablet 0  . Insulin Glargine (LANTUS SOLOSTAR) 100 UNIT/ML Solostar Pen Inject 10 Units into the skin daily. (Patient taking differently: Inject 10 Units into the skin every evening. ) 1 pen 5  . lisinopril (PRINIVIL,ZESTRIL) 40 MG tablet Take 1 tablet (40 mg total) by mouth daily. 30 tablet 5  . metFORMIN (GLUCOPHAGE) 1000 MG tablet Take 1 tablet (1,000 mg total) by mouth 2 (two) times daily with a meal. 60 tablet 5  . ondansetron (ZOFRAN ODT) 8 MG disintegrating tablet Take 1 tablet (8 mg total) by mouth every 8 (eight) hours as needed for nausea or vomiting. 30 tablet 0  . pantoprazole (PROTONIX) 40 MG tablet Take 1 tablet (40 mg total) by mouth daily. 30 tablet 5  . traZODone (DESYREL) 100 MG tablet Take 1 tablet (100 mg  total) by mouth at bedtime. 30 tablet 5  . Vitamin D, Ergocalciferol, (DRISDOL) 50000 units CAPS capsule Take 1 capsule (50,000 Units total) by mouth every 30 (thirty) days. (Patient taking differently: Take 50,000 Units by mouth every 30 (thirty) days. Every 30 days) 12 capsule 0  . Escitalopram Oxalate (LEXAPRO PO) Take by mouth.    Marland Kitchen glucose blood (TRUE METRIX BLOOD GLUCOSE TEST) test strip Use as instructed 100 each 12  . Insulin Pen Needle (B-D ULTRAFINE III SHORT PEN) 31G X 8 MM MISC 1 application by Does not apply route daily. 30 each 11  . saccharomyces boulardii (FLORASTOR) 250 MG capsule Take 1 capsule (250 mg total) by  mouth 2 (two) times daily. 60 capsule 3  . silver sulfADIAZINE (SILVADENE) 1 % cream Apply topically daily. 50 g 0  . TRUEPLUS LANCETS 28G MISC Use as directed 100 each 12    No results found for this or any previous visit (from the past 48 hour(s)). No results found.  ROS  There were no vitals taken for this visit. Physical Exam  On examination patient is alert No adenopathy well-dressed normal affect normal respiratory effort he does have an antalgic gait. Examination he has sausage digit swelling ulceration redness left foot second toe. Radiograph shows destructive bony changes. Assessment/Plan Assessment: Diabetic insensate neuropathy with osteomyelitis ulceration left foot second toe.  Plan: We'll plan for left second toe amputation. Risk and benefits were discussed including risk of the wound not healing. Patient also complains of some increasing the third toe. If the abscess extends to the third toe we will also consider third toe amputation. Plan for discharge to home after surgery.  Newt Minion, MD 04/10/2016, 6:34 AM

## 2016-04-10 NOTE — Anesthesia Postprocedure Evaluation (Signed)
Anesthesia Post Note  Patient: Joandry Wolz  Procedure(s) Performed: Procedure(s) (LRB): Left 2nd Toe Amputation at MTP Joint (Left)  Patient location during evaluation: PACU Anesthesia Type: General Level of consciousness: awake and alert Pain management: pain level controlled Vital Signs Assessment: post-procedure vital signs reviewed and stable Respiratory status: spontaneous breathing, nonlabored ventilation, respiratory function stable and patient connected to nasal cannula oxygen Cardiovascular status: blood pressure returned to baseline and stable Postop Assessment: no signs of nausea or vomiting Anesthetic complications: no    Last Vitals:  Vitals:   04/10/16 1041 04/10/16 1056  BP: (!) 147/93 (!) 154/99  Pulse: 72 72  Resp: 15   Temp:      Last Pain:  Vitals:   04/10/16 1040  TempSrc:   PainSc: 0-No pain                 Tiajuana Amass

## 2016-04-10 NOTE — Progress Notes (Signed)
Orthopedic Tech Progress Note Patient Details:  Shane [name] 21-Apr-1971 [id number]  Ortho Devices Type of Ortho Device: Postop shoe/boot Ortho Device/Splint Location: bilateral Ortho Device/Splint Interventions: Application   Duayne Brideau 04/10/2016, 10:48 AM Viewed order from doctor's order list

## 2016-04-10 NOTE — Op Note (Signed)
04/10/2016  10:09 AM  PATIENT:  Shane [name]    PRE-OPERATIVE DIAGNOSIS:  Osteomyelitis Left 2nd Toe  POST-OPERATIVE DIAGNOSIS:  Same  PROCEDURE:  Left 2nd Toe Amputation at MTP Joint  SURGEON:  Newt Minion, MD  PHYSICIAN ASSISTANT:None ANESTHESIA:   General  PREOPERATIVE INDICATIONS:  [name] is a  45 y.o. male with a diagnosis of Osteomyelitis Left 2nd Toe who failed conservative measures and elected for surgical management.    The risks benefits and alternatives were discussed with the patient preoperatively including but not limited to the risks of infection, bleeding, nerve injury, cardiopulmonary complications, the need for revision surgery, among others, and the patient was willing to proceed.  OPERATIVE IMPLANTS: None  OPERATIVE FINDINGS: Good petechial bleeding no abscess  OPERATIVE PROCEDURE: Patient was brought to the operating room and underwent a general anesthetic. After adequate anesthesia obtained patient's right lower extremity was prepped using DuraPrep draped into a sterile field a timeout was called. A racquet incision was made around the second toe. The toe was amputated through the MTP joint there is no signs of abscess or infection. Electrocautery was used for hemostasis wound was irrigated with normal saline 2-0 nylon was used for skin closure sterile dressing was applied patient was taken to the PACU in stable condition prescription for Percocet follow-up in the office in 1 week

## 2016-04-11 ENCOUNTER — Encounter (HOSPITAL_COMMUNITY): Payer: Self-pay | Admitting: Orthopedic Surgery

## 2016-04-12 ENCOUNTER — Other Ambulatory Visit: Payer: Self-pay | Admitting: Family Medicine

## 2016-04-12 DIAGNOSIS — M79672 Pain in left foot: Secondary | ICD-10-CM

## 2016-04-17 ENCOUNTER — Ambulatory Visit (INDEPENDENT_AMBULATORY_CARE_PROVIDER_SITE_OTHER): Payer: Medicaid Other | Admitting: Orthopedic Surgery

## 2016-04-17 ENCOUNTER — Encounter (INDEPENDENT_AMBULATORY_CARE_PROVIDER_SITE_OTHER): Payer: Self-pay | Admitting: Orthopedic Surgery

## 2016-04-17 VITALS — Ht 68.0 in | Wt 186.0 lb

## 2016-04-17 DIAGNOSIS — S98132A Complete traumatic amputation of one left lesser toe, initial encounter: Secondary | ICD-10-CM

## 2016-04-17 DIAGNOSIS — Z89422 Acquired absence of other left toe(s): Secondary | ICD-10-CM

## 2016-04-17 NOTE — Progress Notes (Signed)
Shane [name] - 45 y.o. male MRN [id number]  Date of birth: 04-21-1971  Office Visit Note: Visit Date: 04/17/2016 PCP: Minerva Ends, MD Referred by: Boykin Nearing, MD  Subjective: Chief Complaint  Patient presents with  . Left Foot - Routine Post Op  . Right 2nd toe   HPI: Patient wearing postop shoe.  States going good.  Looks good. Stitches are still intact.  Little drainage and swollen.    ROS Otherwise per HPI.  Assessment & Plan: Visit Diagnoses:  1. Amputated toe of left foot ([place])     Plan: No additional findings. Harvest sutures apply Steri-Strips wash with soap and water daily apply Band-Aid touchdown weightbearing with walker  Meds & Orders: No orders of the defined types were placed in this encounter.  No orders of the defined types were placed in this encounter.   Follow-up: Return in about 2 weeks (around 05/01/2016).   Procedures: No procedures performed  No notes on file   Clinical History: No specialty comments available.  He reports that he has never smoked. He has never used smokeless tobacco.   Recent Labs  12/31/15 0233 01/01/16 0233 03/09/16 0127  HGBA1C 8.0* 8.0* 7.8*    Objective:  VS:  HT:5\' 8"  (172.7 cm)   WT:186 lb (84.4 kg)  BMI:28.3    BP:   HR: bpm  TEMP: ( )  RESP:  Physical Exam on examination incision is healing quite nicely there is no redness no synovitis no drainage no signs of infection. Ortho Exam Imaging: No results found.  Past Medical/Family/Surgical/Social History: Medications & Allergies reviewed per EMR Patient Active Problem List   Diagnosis Date Noted  . Osteomyelitis of toe of right foot ([place])   . Buzzing in ear, right 03/26/2016  . Joint pain 03/11/2016  . Weakness 03/08/2016  . Dizziness 03/03/2016  . Diabetic ulcer of right foot associated with type 2 diabetes mellitus, with necrosis of muscle ([place]) 02/21/2016  . Anemia, iron deficiency   . Chronic diastolic CHF (congestive heart failure),  NYHA class 1 ([place]) 12/30/2015  . Diabetes mellitus type 2, uncontrolled, with complications ([place]) [id number]  . Depression with anxiety 12/01/2015  . Pain in the chest 12/01/2015  . Insomnia 11/21/2015  . GERD (gastroesophageal reflux disease) 08/18/2015  . Amputated toe of left foot ([place]) 08/18/2015  . Erectile dysfunction 07/04/2015  . Anemia of infection and chronic disease 04/24/2015  . Left arm weakness 04/02/2015  . Sensory disturbance 04/02/2015  . Essential hypertension 04/02/2015  . Hemispheric carotid artery syndrome   . HLD (hyperlipidemia)   . Headache    Past Medical History:  Diagnosis Date  . Anemia   . Bipolar disorder ([place])   . Chest pain    a. 2015 Reportedly normal stress test in [place].  Marland Kitchen Chronic diastolic CHF (congestive heart failure) (HCC)    a.03/2015 Echo: EF 55-60%, Gr 1 DD, mild MR, triv PR.  . Depression   . Depression with anxiety   . Dyspnea    with exertion  . GERD (gastroesophageal reflux disease)   . Hyperlipidemia   . Hypertension    a. 08/2014 Admitted with hypertensive urgency.  . Insomnia   . Internal carotid artery stenosis   . Lower GI bleed 07/04/2015  . Neuropathy ([place])   . Refusal of blood transfusions as patient is Jehovah's Witness   . TIA (transient ischemic attack) 08/2014; 03/2015   a. 08/2014 in setting of hypertensive urgency.  . Type II diabetes mellitus ([place])  started insulin spring 2016, Type II  . Vitamin D deficiency spring 2016   Family History  Problem Relation Age of Onset  . Hypertension Mother   . Diabetes Mother   . Hyperlipidemia Mother   . Heart disease Mother     s/p pacemaker  . Diabetes Father   . Hypertension Father   . Stroke Father   . Heart attack Father     first MI @ 50.  . Stroke Brother    Past Surgical History:  Procedure Laterality Date  . AMPUTATION Left 08/03/2015   Procedure: AMPUTATION LEFT GREAT TOE;  Surgeon: Newt Minion, MD;  Location: [place];  Service: Orthopedics;  Laterality:  Left;  . AMPUTATION Left 12/02/2015   Procedure: amputation of left 2nd digit  . AMPUTATION Left 04/10/2016   Procedure: Left 2nd Toe Amputation at MTP Joint;  Surgeon: Newt Minion, MD;  Location: [place];  Service: Orthopedics;  Laterality: Left;  . APPLICATION OF WOUND VAC Left 12/19/2015   Procedure: APPLICATION OF WOUND VAC;  Surgeon: Meredith Pel, MD;  Location: [place];  Service: Orthopedics;  Laterality: Left;  . CIRCUMCISION    . COLONOSCOPY N/A 01/22/2016   Procedure: COLONOSCOPY;  Surgeon: Gatha Mayer, MD;  Location: [place];  Service: Endoscopy;  Laterality: N/A;  . ESOPHAGOGASTRODUODENOSCOPY N/A 01/19/2016   Procedure: ESOPHAGOGASTRODUODENOSCOPY (EGD);  Surgeon: Manus Gunning, MD;  Location: [place];  Service: Gastroenterology;  Laterality: N/A;  . ESOPHAGOGASTRODUODENOSCOPY N/A 01/22/2016   Procedure: ESOPHAGOGASTRODUODENOSCOPY (EGD);  Surgeon: Gatha Mayer, MD;  Location: [facility] ENDOSCOPY;  Service: Endoscopy;  Laterality: N/A;  . I&D EXTREMITY Left 12/02/2015   Procedure: IRRIGATION AND DEBRIDEMENT OF FOOT; LEFT SECOND TOE AMPUTATION;  Surgeon: Meredith Pel, MD;  Location: [place];  Service: Orthopedics;  Laterality: Left;  . I&D EXTREMITY Left 12/19/2015   Procedure: I & D LEFT FOOT WITH BEADS ;  Surgeon: Meredith Pel, MD;  Location: [place];  Service: Orthopedics;  Laterality: Left;  . I&D EXTREMITY Right 01/17/2016   Procedure: IRRIGATION AND DEBRIDEMENT RIGHT FOOT;  Surgeon: Newt Minion, MD;  Location: [place];  Service: Orthopedics;  Laterality: Right;  . INCISION AND DRAINAGE FOOT Right 01/17/2016  . INGUINAL HERNIA REPAIR Bilateral ~ 1983- ~ 1986  . TONSILLECTOMY  ~ 37   Social History   Occupational History  . Not on file.   Social History Main Topics  . Smoking status: Never Smoker  . Smokeless tobacco: Never Used  . Alcohol use No  . Drug use: No  . Sexual activity: No

## 2016-04-23 MED FILL — ATORVASTATIN 80 MG TABLET: 80 | 30 days supply | Qty: 30 | Fill #0

## 2016-04-23 MED FILL — traZODone HCL 100 MG TABS: 100 | 30 days supply | Qty: 30 | Fill #1

## 2016-04-23 MED FILL — IBUPROFEN 800 MG TABLET: 800 | 10 days supply | Qty: 30 | Fill #0

## 2016-04-23 MED FILL — PANTOPRAZOLE SOD DR 40 MG T: 40 | 30 days supply | Qty: 30 | Fill #2

## 2016-04-23 MED FILL — FLUoxetine HCL 10 MG CAPS: 10 | 15 days supply | Qty: 30 | Fill #1

## 2016-04-23 MED FILL — AMLODIPINE BESYLATE 5 MG TA: 5 | 30 days supply | Qty: 30 | Fill #5

## 2016-04-23 MED FILL — LISINOPRIL 40 MG TABLET: 40 | 30 days supply | Qty: 30 | Fill #5

## 2016-04-23 MED FILL — LANTUS SOLOSTAR 100 UNITS/M: 100 | 30 days supply | Qty: 3 | Fill #1

## 2016-04-24 MED FILL — glipiZIDE XL 10 MG TB24: 10 | 30 days supply | Qty: 30 | Fill #1

## 2016-04-26 ENCOUNTER — Encounter: Payer: Self-pay | Admitting: Family Medicine

## 2016-04-26 ENCOUNTER — Ambulatory Visit: Payer: Medicaid Other | Attending: Family Medicine | Admitting: Family Medicine

## 2016-04-26 VITALS — BP 131/86 | HR 80 | Temp 97.9°F | Ht 68.0 in | Wt 191.4 lb

## 2016-04-26 DIAGNOSIS — Z794 Long term (current) use of insulin: Secondary | ICD-10-CM | POA: Diagnosis not present

## 2016-04-26 DIAGNOSIS — E118 Type 2 diabetes mellitus with unspecified complications: Secondary | ICD-10-CM | POA: Diagnosis not present

## 2016-04-26 DIAGNOSIS — E559 Vitamin D deficiency, unspecified: Secondary | ICD-10-CM | POA: Diagnosis not present

## 2016-04-26 DIAGNOSIS — E1165 Type 2 diabetes mellitus with hyperglycemia: Secondary | ICD-10-CM

## 2016-04-26 DIAGNOSIS — Z89422 Acquired absence of other left toe(s): Secondary | ICD-10-CM | POA: Insufficient documentation

## 2016-04-26 DIAGNOSIS — Z7982 Long term (current) use of aspirin: Secondary | ICD-10-CM | POA: Diagnosis not present

## 2016-04-26 DIAGNOSIS — E1159 Type 2 diabetes mellitus with other circulatory complications: Secondary | ICD-10-CM | POA: Insufficient documentation

## 2016-04-26 DIAGNOSIS — F329 Major depressive disorder, single episode, unspecified: Secondary | ICD-10-CM | POA: Diagnosis not present

## 2016-04-26 DIAGNOSIS — L309 Dermatitis, unspecified: Secondary | ICD-10-CM | POA: Insufficient documentation

## 2016-04-26 DIAGNOSIS — Z89412 Acquired absence of left great toe: Secondary | ICD-10-CM | POA: Diagnosis not present

## 2016-04-26 DIAGNOSIS — Z79899 Other long term (current) drug therapy: Secondary | ICD-10-CM | POA: Diagnosis not present

## 2016-04-26 DIAGNOSIS — S98132A Complete traumatic amputation of one left lesser toe, initial encounter: Secondary | ICD-10-CM

## 2016-04-26 DIAGNOSIS — IMO0002 Reserved for concepts with insufficient information to code with codable children: Secondary | ICD-10-CM

## 2016-04-26 DIAGNOSIS — I10 Essential (primary) hypertension: Secondary | ICD-10-CM | POA: Diagnosis not present

## 2016-04-26 LAB — GLUCOSE, POCT (MANUAL RESULT ENTRY): POC Glucose: 240 mg/dl — AB (ref 70–99)

## 2016-04-26 MED ORDER — VITAMIN D (ERGOCALCIFEROL) 1.25 MG (50000 UNIT) PO CAPS
50000.0000 [IU] | ORAL_CAPSULE | ORAL | 1 refills | Status: DC
Start: 2016-04-26 — End: 2017-08-11

## 2016-04-26 MED ORDER — INSULIN GLARGINE 100 UNIT/ML SOLOSTAR PEN: 10.0000 [IU] | PEN_INJECTOR | Freq: Every evening | SUBCUTANEOUS | 11 refills | Status: DC

## 2016-04-26 MED ORDER — TRIAMCINOLONE ACETONIDE 0.5 % EX OINT: 1.0000 "application " | TOPICAL_OINTMENT | Freq: Two times a day (BID) | CUTANEOUS | 0 refills | Status: DC

## 2016-04-26 MED FILL — VIT D2 1.25 MG (50,000 UNIT: 1.25 MG | 30 days supply | Qty: 1 | Fill #0

## 2016-04-26 MED FILL — metFORMIN HCL 1000 MG TABS: 1000 | 30 days supply | Qty: 60 | Fill #4

## 2016-04-26 NOTE — Progress Notes (Signed)
Subjective:  Patient ID: Shane [name], male    DOB: Sep 19, 1970  Age: 45 y.o. MRN: [id number]  CC: No chief complaint on file.   HPI Shane [name] has diabetes with vascular complications, depression, H TN he presents for   1. F/u L 2nd toe amputation: L 2nd toe amputated on 04/10/2016. He has seen Dr. Sharol Given in follow up on 04/17/2016. His wound is healing well. His pain is minimal. He has return of sensation in his feet.   2. Diabetes type 2: he has improved sugars since starting lantus. Fasting CBGs in 110-150. He needs lantus refill. He denies low sugars.   Social History  Substance Use Topics  . Smoking status: Never Smoker  . Smokeless tobacco: Never Used  . Alcohol use No   Past Surgical History:  Procedure Laterality Date  . AMPUTATION Left 08/03/2015   Procedure: AMPUTATION LEFT GREAT TOE;  Surgeon: Shane Minion, MD;  Location: [place];  Service: Orthopedics;  Laterality: Left;  . AMPUTATION Left 12/02/2015   Procedure: amputation of left 2nd digit  . AMPUTATION Left 04/10/2016   Procedure: Left 2nd Toe Amputation at MTP Joint;  Surgeon: Shane Minion, MD;  Location: [place];  Service: Orthopedics;  Laterality: Left;  . APPLICATION OF WOUND VAC Left 12/19/2015   Procedure: APPLICATION OF WOUND VAC;  Surgeon: Meredith Pel, MD;  Location: [place];  Service: Orthopedics;  Laterality: Left;  . CIRCUMCISION    . COLONOSCOPY N/A 01/22/2016   Procedure: COLONOSCOPY;  Surgeon: Gatha Mayer, MD;  Location: [place];  Service: Endoscopy;  Laterality: N/A;  . ESOPHAGOGASTRODUODENOSCOPY N/A 01/19/2016   Procedure: ESOPHAGOGASTRODUODENOSCOPY (EGD);  Surgeon: Manus Gunning, MD;  Location: [place];  Service: Gastroenterology;  Laterality: N/A;  . ESOPHAGOGASTRODUODENOSCOPY N/A 01/22/2016   Procedure: ESOPHAGOGASTRODUODENOSCOPY (EGD);  Surgeon: Gatha Mayer, MD;  Location: [facility] ENDOSCOPY;  Service: Endoscopy;  Laterality: N/A;  . I&D EXTREMITY Left 12/02/2015   Procedure:  IRRIGATION AND DEBRIDEMENT OF FOOT; LEFT SECOND TOE AMPUTATION;  Surgeon: Meredith Pel, MD;  Location: [place];  Service: Orthopedics;  Laterality: Left;  . I&D EXTREMITY Left 12/19/2015   Procedure: I & D LEFT FOOT WITH BEADS ;  Surgeon: Meredith Pel, MD;  Location: [place];  Service: Orthopedics;  Laterality: Left;  . I&D EXTREMITY Right 01/17/2016   Procedure: IRRIGATION AND DEBRIDEMENT RIGHT FOOT;  Surgeon: Shane Minion, MD;  Location: [place];  Service: Orthopedics;  Laterality: Right;  . INCISION AND DRAINAGE FOOT Right 01/17/2016  . INGUINAL HERNIA REPAIR Bilateral ~ 1983- ~ 1986  . TONSILLECTOMY  ~ 1985    Outpatient Medications Prior to Visit  Medication Sig Dispense Refill  . amLODipine (NORVASC) 10 MG tablet Take 1 tablet (10 mg total) by mouth daily. 30 tablet 0  . aspirin EC 81 MG tablet Take 1 tablet (81 mg total) by mouth daily. 30 tablet 11  . atorvastatin (LIPITOR) 80 MG tablet Take 1 tablet (80 mg total) by mouth every morning. 30 tablet 5  . carvedilol (COREG) 12.5 MG tablet TAKE 1 TABLET BY MOUTH 2 TIMES DAILY WITH A MEAL 60 tablet 3  . divalproex (DEPAKOTE) 500 MG DR tablet Take 2 tablets (1,000 mg total) by mouth at bedtime. 30 tablet 5  . doxycycline (VIBRA-TABS) 100 MG tablet Take 1 tablet (100 mg total) by mouth 2 (two) times daily. 14 tablet 0  . Escitalopram Oxalate (LEXAPRO PO) Take by mouth.    . ferrous sulfate 325 (  65 FE) MG tablet Take 1 tablet (325 mg total) by mouth 2 (two) times daily with a meal. (Patient taking differently: Take 325 mg by mouth daily with breakfast. ) 60 tablet 5  . FLUoxetine (PROZAC) 20 MG tablet Take 1 tablet (20 mg total) by mouth every morning. 30 tablet 5  . gabapentin (NEURONTIN) 100 MG capsule Take 2 capsules (200 mg total) by mouth 3 (three) times daily. 90 capsule 0  . glipiZIDE (GLIPIZIDE XL) 10 MG 24 hr tablet Take 1 tablet (10 mg total) by mouth daily with breakfast. 90 tablet 3  . glucose blood (TRUE METRIX BLOOD GLUCOSE  TEST) test strip Use as instructed 100 each 12  . HYDROcodone-acetaminophen (NORCO/VICODIN) 5-325 MG tablet Take 1 tablet by mouth every 6 (six) hours as needed for moderate pain. 12 tablet 0  . ibuprofen (ADVIL,MOTRIN) 800 MG tablet TAKE 1 TABLET BY MOUTH EVERY 8 HOURS AS NEEDED. 30 tablet 0  . Insulin Glargine (LANTUS SOLOSTAR) 100 UNIT/ML Solostar Pen Inject 10 Units into the skin daily. (Patient taking differently: Inject 10 Units into the skin every evening. ) 1 pen 5  . Insulin Pen Needle (B-D ULTRAFINE III SHORT PEN) 31G X 8 MM MISC 1 application by Does not apply route daily. 30 each 11  . lisinopril (PRINIVIL,ZESTRIL) 40 MG tablet Take 1 tablet (40 mg total) by mouth daily. 30 tablet 5  . metFORMIN (GLUCOPHAGE) 1000 MG tablet Take 1 tablet (1,000 mg total) by mouth 2 (two) times daily with a meal. 60 tablet 5  . ondansetron (ZOFRAN ODT) 8 MG disintegrating tablet Take 1 tablet (8 mg total) by mouth every 8 (eight) hours as needed for nausea or vomiting. 30 tablet 0  . oxyCODONE-acetaminophen (ROXICET) 5-325 MG tablet Take 1 tablet by mouth every 4 (four) hours as needed for severe pain. 60 tablet 0  . pantoprazole (PROTONIX) 40 MG tablet Take 1 tablet (40 mg total) by mouth daily. 30 tablet 5  . traZODone (DESYREL) 100 MG tablet Take 1 tablet (100 mg total) by mouth at bedtime. 30 tablet 5  . TRUEPLUS LANCETS 28G MISC Use as directed 100 each 12  . Vitamin D, Ergocalciferol, (DRISDOL) 50000 units CAPS capsule Take 1 capsule (50,000 Units total) by mouth every 30 (thirty) days. (Patient taking differently: Take 50,000 Units by mouth every 30 (thirty) days. Every 30 days) 12 capsule 0   No facility-administered medications prior to visit.     ROS Review of Systems  Constitutional: Positive for fatigue. Negative for chills, fever and unexpected weight change.  HENT: Positive for hearing loss and tinnitus.   Eyes: Negative for visual disturbance.  Respiratory: Negative for cough, chest  tightness and shortness of breath.   Cardiovascular: Negative for chest pain, palpitations and leg swelling.  Gastrointestinal: Negative for abdominal pain, blood in stool, constipation, diarrhea, nausea and vomiting.  Endocrine: Negative for polydipsia, polyphagia and polyuria.  Musculoskeletal: Positive for arthralgias. Negative for back pain, gait problem, joint swelling, myalgias and neck pain.  Skin: Positive for wound (dorsum on R foot ). Negative for rash.  Allergic/Immunologic: Negative for immunocompromised state.  Neurological: Negative for dizziness and numbness.  Hematological: Negative for adenopathy. Does not bruise/bleed easily.  Psychiatric/Behavioral: Negative for dysphoric mood, sleep disturbance and suicidal ideas. The patient is not nervous/anxious.     Objective:  BP 131/86 (BP Location: Right Arm, Patient Position: Sitting, Cuff Size: Small)   Pulse 80   Temp 97.9 F (36.6 C) (Oral)   Ht 5'  8" (1.727 m)   Wt 191 lb 6.4 oz (86.8 kg)   SpO2 98%   BMI 29.10 kg/m   BP/Weight 04/26/2016 04/17/2016 0000000  Systolic BP A999333 - 123456  Diastolic BP 86 - 99  Wt. (Lbs) 191.4 186 184.63  BMI 29.1 28.28 28.07   Physical Exam  Constitutional: He appears well-developed and well-nourished. No distress.  HENT:  Head: Normocephalic and atraumatic.  Right Ear: Tympanic membrane, external ear and ear canal normal.  Left Ear: Tympanic membrane, external ear and ear canal normal.  Neck: Normal range of motion. Neck supple.  Cardiovascular: Normal rate, regular rhythm, normal heart sounds and intact distal pulses.   Pulses:      Femoral pulses are 1+ on the right side, and 1+ on the left side.      Popliteal pulses are 1+ on the right side, and 1+ on the left side.       Dorsalis pedis pulses are 1+ on the right side, and 1+ on the left side.       Posterior tibial pulses are 1+ on the right side, and 1+ on the left side.  Pulmonary/Chest: Effort normal and breath sounds  normal.  Musculoskeletal: He exhibits no edema.       Feet:  Neurological: He is alert.  Skin: Skin is warm and dry. No rash noted. No erythema.  Dry and flaking in lower legs   Psychiatric: He has a normal mood and affect.   Lab Results  Component Value Date   HGBA1C 7.8 (H) 03/09/2016   CBG 240  Assessment & Plan:  Shane [name] was seen today for follow-up.  Diagnoses and all orders for this visit:  Uncontrolled type 2 diabetes mellitus with complication, without long-term current use of insulin (HCC) -     Glucose (CBG) -     Insulin Glargine (LANTUS SOLOSTAR) 100 UNIT/ML Solostar Pen; Inject 10-30 Units into the skin every evening. Titration of lantus  Vitamin D insufficiency -     Vitamin D, Ergocalciferol, (DRISDOL) 50000 units CAPS capsule; Take 1 capsule (50,000 Units total) by mouth every 30 (thirty) days. Every 30 days  Amputated toe of left foot (HCC)  Dermatitis -     triamcinolone ointment (KENALOG) 0.5 %; Apply 1 application topically 2 (two) times daily.   There are no diagnoses linked to this encounter.  No orders of the defined types were placed in this encounter.   Follow-up: Return in about 6 weeks (around 06/07/2016) for diabetes/A1c check .   Boykin Nearing MD

## 2016-04-26 NOTE — Assessment & Plan Note (Signed)
Dermatitis of lower legs following venous stasis  Plan: Steroid cream Emollient

## 2016-04-26 NOTE — Assessment & Plan Note (Signed)
Healing of amputated L 2nd toe

## 2016-04-26 NOTE — Patient Instructions (Addendum)
Pietro was seen today for follow-up.  Diagnoses and all orders for this visit:  Uncontrolled type 2 diabetes mellitus with complication, without long-term current use of insulin (HCC) -     Glucose (CBG)  Vitamin D insufficiency -     Vitamin D, Ergocalciferol, (DRISDOL) 50000 units CAPS capsule; Take 1 capsule (50,000 Units total) by mouth every 30 (thirty) days. Every 30 days   Your A1c goal is < 7 Lab Results  Component Value Date   HGBA1C 7.8 (H) 03/09/2016     Continue to monitor sugars if fasting sugar goal is 80-130 If sugar is above goal increase lantus from 10 to 12 U, increase by 2 units every 2 days until fasting sugars stay in goal range 80-130.    F/u in 6 weeks for repeat A1c/diabetes check   Dr. Adrian Blackwater

## 2016-04-26 NOTE — Assessment & Plan Note (Signed)
Improving Continue lantus lantus titration to fasting goal of 80-130

## 2016-04-29 MED FILL — TRIAMCINOLONE 0.5% OINTMENT: 0.5 | 15 days supply | Qty: 60 | Fill #0

## 2016-05-01 ENCOUNTER — Ambulatory Visit (INDEPENDENT_AMBULATORY_CARE_PROVIDER_SITE_OTHER): Payer: Medicaid Other | Admitting: Family

## 2016-05-01 ENCOUNTER — Encounter (INDEPENDENT_AMBULATORY_CARE_PROVIDER_SITE_OTHER): Payer: Self-pay | Admitting: Orthopedic Surgery

## 2016-05-01 VITALS — Ht 68.0 in | Wt 191.0 lb

## 2016-05-01 DIAGNOSIS — S98132A Complete traumatic amputation of one left lesser toe, initial encounter: Secondary | ICD-10-CM

## 2016-05-01 DIAGNOSIS — Z89422 Acquired absence of other left toe(s): Secondary | ICD-10-CM

## 2016-05-01 DIAGNOSIS — E11621 Type 2 diabetes mellitus with foot ulcer: Secondary | ICD-10-CM

## 2016-05-01 DIAGNOSIS — L97413 Non-pressure chronic ulcer of right heel and midfoot with necrosis of muscle: Secondary | ICD-10-CM

## 2016-05-01 NOTE — Progress Notes (Signed)
Wound Care Note   Patient: Shane [name]           Date of Birth: Sep 09, 1970           MRN: [id number]             PCP: Minerva Ends, MD Visit Date: 05/01/2016   Assessment & Plan: Visit Diagnoses:  1. Diabetic ulcer of right midfoot associated with type 2 diabetes mellitus, with necrosis of muscle ([place])   2. Amputated toe of left foot ([place])     Plan: Continue with Vive [place] stockings daily. Remove these once daily to cleanse ulcer. Provided him with an order to [facility] for a custom orthotics with a spacer on the left.   Follow-Up Instructions: Return in about 3 weeks (around 05/22/2016).  Orders:  No orders of the defined types were placed in this encounter.  No orders of the defined types were placed in this encounter.     Procedures: No notes on file   Clinical Data: No additional findings.   No images are attached to the encounter.   Subjective: Chief Complaint  Patient presents with  . Right Foot - Wound Check    01/17/16 right foot incision and drainage prevena wound vac    Patient is 14 weeks s/p a right foot incision and drainage with prevena wound vac application to the right foot. The pt is weight bearing with a walker in a post op shoe and wearing a Vive compression sock. He has a small open area that is pink granulating tissue. No swelling and no redness. The patient feel swell is not taking any abx and does not have any questions or concerns.    Review of Systems  Constitutional: Negative for chills and fever.  Cardiovascular: Negative for leg swelling.  Skin: Positive for wound.  All other systems reviewed and are negative.     Objective: Vital Signs: Ht 5\' 8"  (1.727 m)   Wt 191 lb (86.6 kg)   BMI 29.04 kg/m   Physical Exam: Mild swelling bilateral lowe extremities. No weeping. Remarkable improvement in right foot dorsal ulcer. This is now just nickel sized, completely filled in with beefy granulation tissue which is bleeding. No  drainage no erythema no sign of infection. The left foot great and second toe amputations have completely healed.      Specialty Comments: No specialty comments available.   PMFS History: Patient Active Problem List   Diagnosis Date Noted  . Dermatitis 04/26/2016  . Buzzing in ear, right 03/26/2016  . Joint pain 03/11/2016  . Weakness 03/08/2016  . Dizziness 03/03/2016  . Diabetic ulcer of right foot associated with type 2 diabetes mellitus, with necrosis of muscle ([place]) 02/21/2016  . Anemia, iron deficiency   . Chronic diastolic CHF (congestive heart failure), NYHA class 1 ([place]) 12/30/2015  . Diabetes mellitus type 2, uncontrolled, with complications ([place]) [id number]  . Depression with anxiety 12/01/2015  . Pain in the chest 12/01/2015  . Insomnia 11/21/2015  . GERD (gastroesophageal reflux disease) 08/18/2015  . Amputated toe of left foot ([place]) 08/18/2015  . Erectile dysfunction 07/04/2015  . Anemia of infection and chronic disease 04/24/2015  . Left arm weakness 04/02/2015  . Sensory disturbance 04/02/2015  . Essential hypertension 04/02/2015  . Hemispheric carotid artery syndrome   . HLD (hyperlipidemia)   . Headache    Past Medical History:  Diagnosis Date  . Anemia   . Bipolar disorder ([place])   . Chest pain  a. 2015 Reportedly normal stress test in [place].  [place] Kitchen Chronic diastolic CHF (congestive heart failure) (HCC)    a.03/2015 Echo: EF 55-60%, Gr 1 DD, mild MR, triv PR.  . Depression   . Depression with anxiety   . Dyspnea    with exertion  . GERD (gastroesophageal reflux disease)   . Hyperlipidemia   . Hypertension    a. 08/2014 Admitted with hypertensive urgency.  . Insomnia   . Internal carotid artery stenosis   . Lower GI bleed 07/04/2015  . Neuropathy ([place])   . Refusal of blood transfusions as patient is Jehovah's Witness   . TIA (transient ischemic attack) 08/2014; 03/2015   a. 08/2014 in setting of hypertensive urgency.  . Type II diabetes mellitus ([place])     started insulin spring 2016, Type II  . Vitamin D deficiency spring 2016    Family History  Problem Relation Age of Onset  . Hypertension Mother   . Diabetes Mother   . Hyperlipidemia Mother   . Heart disease Mother     s/p pacemaker  . Diabetes Father   . Hypertension Father   . Stroke Father   . Heart attack Father     first MI @ 56.  . Stroke Brother    Past Surgical History:  Procedure Laterality Date  . AMPUTATION Left 08/03/2015   Procedure: AMPUTATION LEFT GREAT TOE;  Surgeon: Newt Minion, MD;  Location: [place];  Service: Orthopedics;  Laterality: Left;  . AMPUTATION Left 12/02/2015   Procedure: amputation of left 2nd digit  . AMPUTATION Left 04/10/2016   Procedure: Left 2nd Toe Amputation at MTP Joint;  Surgeon: Newt Minion, MD;  Location: [place];  Service: Orthopedics;  Laterality: Left;  . APPLICATION OF WOUND VAC Left 12/19/2015   Procedure: APPLICATION OF WOUND VAC;  Surgeon: Meredith Pel, MD;  Location: [place];  Service: Orthopedics;  Laterality: Left;  . CIRCUMCISION    . COLONOSCOPY N/A 01/22/2016   Procedure: COLONOSCOPY;  Surgeon: Gatha Mayer, MD;  Location: [place];  Service: Endoscopy;  Laterality: N/A;  . ESOPHAGOGASTRODUODENOSCOPY N/A 01/19/2016   Procedure: ESOPHAGOGASTRODUODENOSCOPY (EGD);  Surgeon: Manus Gunning, MD;  Location: [place];  Service: Gastroenterology;  Laterality: N/A;  . ESOPHAGOGASTRODUODENOSCOPY N/A 01/22/2016   Procedure: ESOPHAGOGASTRODUODENOSCOPY (EGD);  Surgeon: Gatha Mayer, MD;  [facility] ENDOSCOPY;  Service: Endoscopy;  Laterality: N/A;  . I&D EXTREMITY Left 12/02/2015   Procedure: IRRIGATION AND DEBRIDEMENT OF FOOT; LEFT SECOND TOE AMPUTATION;  Surgeon: Meredith Pel, MD;  Location: [place];  Service: Orthopedics;  Laterality: Left;  . I&D EXTREMITY Left 12/19/2015   Procedure: I & D LEFT FOOT WITH BEADS ;  Surgeon: Meredith Pel, MD;  Location: [place];  Service: Orthopedics;  Laterality: Left;  . I&D  EXTREMITY Right 01/17/2016   Procedure: IRRIGATION AND DEBRIDEMENT RIGHT FOOT;  Surgeon: Newt Minion, MD;  Location: [place];  Service: Orthopedics;  Laterality: Right;  . INCISION AND DRAINAGE FOOT Right 01/17/2016  . INGUINAL HERNIA REPAIR Bilateral ~ 1983- ~ 1986  . TONSILLECTOMY  ~ 60   Social History   Occupational History  . Not on file.   Social History Main Topics  . Smoking status: Never Smoker  . Smokeless tobacco: Never Used  . Alcohol use No  . Drug use: No  . Sexual activity: No

## 2016-05-13 ENCOUNTER — Other Ambulatory Visit: Payer: Self-pay | Admitting: Family Medicine

## 2016-05-13 MED FILL — ?CARVEDILOL 12.5 MG TABLET: 12.5 | 30 days supply | Qty: 60 | Fill #1

## 2016-05-13 MED FILL — TRUE METRIX TEST STRIP: 25 days supply | Qty: 100 | Fill #2

## 2016-05-20 ENCOUNTER — Ambulatory Visit (INDEPENDENT_AMBULATORY_CARE_PROVIDER_SITE_OTHER): Payer: Medicaid Other | Admitting: Podiatry

## 2016-05-20 ENCOUNTER — Other Ambulatory Visit: Payer: Self-pay | Admitting: Family Medicine

## 2016-05-20 ENCOUNTER — Encounter: Payer: Self-pay | Admitting: Podiatry

## 2016-05-20 VITALS — BP 153/93 | HR 87

## 2016-05-20 DIAGNOSIS — E1143 Type 2 diabetes mellitus with diabetic autonomic (poly)neuropathy: Secondary | ICD-10-CM

## 2016-05-20 DIAGNOSIS — Z89419 Acquired absence of unspecified great toe: Secondary | ICD-10-CM

## 2016-05-20 DIAGNOSIS — Z89422 Acquired absence of other left toe(s): Secondary | ICD-10-CM

## 2016-05-20 DIAGNOSIS — I10 Essential (primary) hypertension: Secondary | ICD-10-CM

## 2016-05-20 MED FILL — glipiZIDE XL 10 MG TB24: 10 | 30 days supply | Qty: 30 | Fill #2

## 2016-05-20 MED FILL — ATORVASTATIN 80 MG TABLET: 80 | 30 days supply | Qty: 30 | Fill #1

## 2016-05-20 MED FILL — PANTOPRAZOLE SOD DR 40 MG T: 40 | 30 days supply | Qty: 30 | Fill #3

## 2016-05-20 MED FILL — AMLODIPINE BESYLATE 5 MG TA: 5 | 30 days supply | Qty: 30 | Fill #6

## 2016-05-20 NOTE — Progress Notes (Signed)
Subjective:  Patient presents today for routine diabetic lower extremity evaluation and is wanting prescription for diabetic shoes with Plastizote inserts. Patient currently has wounds on the dorsum of his bilateral feet which is being managed by Dr. Sharol Given. Patient presents today for follow-up treatment and evaluation    Objective/Physical Exam General: The patient is alert and oriented x3 in no acute distress.  Dermatology: Skin is warm, dry and supple bilateral lower extremities. Negative for open lesions or macerations.  Vascular: Palpable pedal pulses bilaterally. No edema or erythema noted. Capillary refill within normal limits.  Neurological: Epicritic and protective threshold diminished bilaterally.   Musculoskeletal Exam: History of toe amputations to digits 1 and 2 of the left foot-healed.  Range of motion within normal limits to all pedal and ankle joints bilateral. Muscle strength 5/5 in all groups bilateral.   Assessment: #1 diabetes mellitus with an for stations of peripheral neuropathy #2 history of toe amputations digits 1 and 2 left foot  Plan of Care:  #1 Patient was evaluated. #2 stressed the importance of maintaining appropriate glucose levels in regards to foot health. Also discussed conservative management including appropriate shoe gear and to check her feet daily for any changes. #3 prescription for diabetic shoes through [facility] lab #4 maintain routine care with Dr. Sharol Given for wound management  #5 return to clinic when necessary   Dr. Edrick Kins, [place]

## 2016-05-21 MED FILL — LISINOPRIL 40 MG TABLET: 40 | 30 days supply | Qty: 30 | Fill #0

## 2016-05-22 ENCOUNTER — Ambulatory Visit (INDEPENDENT_AMBULATORY_CARE_PROVIDER_SITE_OTHER): Payer: Medicaid Other | Admitting: Family

## 2016-05-22 ENCOUNTER — Encounter (INDEPENDENT_AMBULATORY_CARE_PROVIDER_SITE_OTHER): Payer: Self-pay | Admitting: Orthopedic Surgery

## 2016-05-22 VITALS — Ht 68.0 in | Wt 191.0 lb

## 2016-05-22 DIAGNOSIS — E11621 Type 2 diabetes mellitus with foot ulcer: Secondary | ICD-10-CM

## 2016-05-22 DIAGNOSIS — L97413 Non-pressure chronic ulcer of right heel and midfoot with necrosis of muscle: Secondary | ICD-10-CM | POA: Diagnosis not present

## 2016-05-22 DIAGNOSIS — L97521 Non-pressure chronic ulcer of other part of left foot limited to breakdown of skin: Secondary | ICD-10-CM

## 2016-05-22 DIAGNOSIS — L97529 Non-pressure chronic ulcer of other part of left foot with unspecified severity: Secondary | ICD-10-CM | POA: Insufficient documentation

## 2016-05-22 NOTE — Progress Notes (Signed)
Office Visit Note   Patient: Shane [name]           Date of Birth: [birth year]           MRN: [id number] Visit Date: 05/22/2016              Requested by: Shane Nearing, MD [address] [place], [place] [address] PCP: Shane Ends, MD   Assessment & Plan: Visit Diagnoses: No diagnosis found.  Plan: Continue vive compression stockings with sleeves daily. May advance weightbearing in regular shoe wear. States [employer] is currently fabricating some custom shoes with orthotics for him. Continue daily wound care follow-up in the office in one month. May use hydrocortisone over the dermatitis.  Follow-Up Instructions: No Follow-up on file.   Orders:  No orders of the defined types were placed in this encounter.  No orders of the defined types were placed in this encounter.     Procedures: No procedures performed   Clinical Data: No additional findings.   Subjective: Chief Complaint  Patient presents with  . Right Foot - Wound Check    Right foot dorsal wound, status post I&D 12/19/15  . Left Foot - Follow-up    Left 2nd toe amputation at MTP joint 04/10/16    Patient is a 45 year old gentleman who is here for follow up right foot dorsal wound status post right foot I&D 12/19/15. He is wearing vive compression stocking. He states lately he has been having issues with the socks. He feels he has increased itching feeling. He feels skin has discoloration he is concerned about. He denies any pain. There is excellent resolution on wound and decrease in size. Ulceration over the left foot dorsally has completely healed.   Wound Check     Review of Systems  Constitutional: Negative for chills and fever.  Cardiovascular: Negative for leg swelling.  Skin: Positive for rash and wound.     Objective: Vital Signs: Ht 5\' 8"  (1.727 m)   Wt 191 lb (86.6 kg)   BMI 29.04 kg/m   Physical Exam  Constitutional: He is oriented to person, place, and time. He appears  well-developed and well-nourished.  Pulmonary/Chest: Effort normal.  Neurological: He is alert and oriented to person, place, and time.  Skin: Rash noted.  Dorsal right foot with a 5 mm in diameter ulceration. This filled in with beefy granulation tissue. Some bleeding. No purulence no surrounding erythema or sign of infection. Does have some dermatitis on the bilateral lower extremities. There is no erythema or sign of infection.  Dorsal left foot ulcer has completely healed.  Psychiatric: He has a normal mood and affect.  Nursing note reviewed.   Ortho Exam  Specialty Comments:  No specialty comments available.  Imaging: No results found.   PMFS History: Patient Active Problem List   Diagnosis Date Noted  . Dermatitis 04/26/2016  . Buzzing in ear, right 03/26/2016  . Joint pain 03/11/2016  . Weakness 03/08/2016  . Dizziness 03/03/2016  . Diabetic ulcer of right foot associated with type 2 diabetes mellitus, with necrosis of muscle ([place]) 02/21/2016  . Anemia, iron deficiency   . Chronic diastolic CHF (congestive heart failure), NYHA class 1 ([place]) 12/30/2015  . Diabetes mellitus type 2, uncontrolled, with complications ([place]) [id number]  . Depression with anxiety 12/01/2015  . Pain in the chest 12/01/2015  . Insomnia 11/21/2015  . GERD (gastroesophageal reflux disease) 08/18/2015  . Amputated toe of left foot ([place]) 08/18/2015  . Erectile dysfunction 07/04/2015  .  Anemia of infection and chronic disease 04/24/2015  . Left arm weakness 04/02/2015  . Sensory disturbance 04/02/2015  . Essential hypertension 04/02/2015  . Hemispheric carotid artery syndrome   . HLD (hyperlipidemia)   . Headache    Past Medical History:  Diagnosis Date  . Anemia   . Bipolar disorder ([place])   . Chest pain    a. 2015 Reportedly normal stress test in [place].  [place] Chronic diastolic CHF (congestive heart failure) (HCC)    a.03/2015 Echo: EF 55-60%, Gr 1 DD, mild MR, triv PR.  . Depression   .  Depression with anxiety   . Dyspnea    with exertion  . GERD (gastroesophageal reflux disease)   . Hyperlipidemia   . Hypertension    a. 08/2014 Admitted with hypertensive urgency.  . Insomnia   . Internal carotid artery stenosis   . Lower GI bleed 07/04/2015  . Neuropathy ([place])   . Refusal of blood transfusions as patient is Jehovah's Witness   . TIA (transient ischemic attack) 08/2014; 03/2015   a. 08/2014 in setting of hypertensive urgency.  . Type II diabetes mellitus ([place])    started insulin spring 2016, Type II  . Vitamin D deficiency spring 2016    Family History  Problem Relation Age of Onset  . Hypertension Mother   . Diabetes Mother   . Hyperlipidemia Mother   . Heart disease Mother     s/p pacemaker  . Diabetes Father   . Hypertension Father   . Stroke Father   . Heart attack Father     first MI @ 45.  . Stroke Brother     Past Surgical History:  Procedure Laterality Date  . AMPUTATION Left 08/03/2015   Procedure: AMPUTATION LEFT GREAT TOE;  Surgeon: Shane Minion, MD;  Location: [facility];  Service: Orthopedics;  Laterality: Left;  . AMPUTATION Left 12/02/2015   Procedure: amputation of left 2nd digit  . AMPUTATION Left 04/10/2016   Procedure: Left 2nd Toe Amputation at MTP Joint;  Surgeon: Shane Minion, MD;  Location: [facility];  Service: Orthopedics;  Laterality: Left;  . APPLICATION OF WOUND VAC Left 12/19/2015   Procedure: APPLICATION OF WOUND VAC;  Surgeon: Shane Pel, MD;  Location: [facility];  Service: Orthopedics;  Laterality: Left;  . CIRCUMCISION    . COLONOSCOPY N/A 01/22/2016   Procedure: COLONOSCOPY;  Surgeon: Shane Mayer, MD;  Location: [facility];  Service: Endoscopy;  Laterality: N/A;  . ESOPHAGOGASTRODUODENOSCOPY N/A 01/19/2016   Procedure: ESOPHAGOGASTRODUODENOSCOPY (EGD);  Surgeon: Shane Gunning, MD;  Location: [facility];  Service: Gastroenterology;  Laterality: N/A;  . ESOPHAGOGASTRODUODENOSCOPY N/A 01/22/2016   Procedure:  ESOPHAGOGASTRODUODENOSCOPY (EGD);  Surgeon: Shane Mayer, MD;  Location: [facility]COPY;  Service: Endoscopy;  Laterality: N/A;  . I&D EXTREMITY Left 12/02/2015   Procedure: IRRIGATION AND DEBRIDEMENT OF FOOT; LEFT SECOND TOE AMPUTATION;  Surgeon: Shane Pel, MD;  Location: [facility];  Service: Orthopedics;  Laterality: Left;  . I&D EXTREMITY Left 12/19/2015   Procedure: I & D LEFT FOOT WITH BEADS ;  Surgeon: Shane Pel, MD;  Location: [facility];  Service: Orthopedics;  Laterality: Left;  . I&D EXTREMITY Right 01/17/2016   Procedure: IRRIGATION AND DEBRIDEMENT RIGHT FOOT;  Surgeon: Shane Minion, MD;  Location: [facility];  Service: Orthopedics;  Laterality: Right;  . INCISION AND DRAINAGE FOOT Right 01/17/2016  . INGUINAL HERNIA REPAIR Bilateral ~ 1983- ~ 1986  . TONSILLECTOMY  ~ 1985  Social History   Occupational History  . Not on file.   Social History Main Topics  . Smoking status: Never Smoker  . Smokeless tobacco: Never Used  . Alcohol use No  . Drug use: No  . Sexual activity: No

## 2016-05-27 ENCOUNTER — Other Ambulatory Visit: Payer: Self-pay | Admitting: Family Medicine

## 2016-05-27 DIAGNOSIS — M79672 Pain in left foot: Secondary | ICD-10-CM

## 2016-05-27 MED FILL — metFORMIN HCL 1000 MG TABS: 1000 | 30 days supply | Qty: 60 | Fill #5

## 2016-05-27 MED FILL — traZODone HCL 100 MG TABS: 100 | 30 days supply | Qty: 30 | Fill #2

## 2016-05-28 MED FILL — IBUPROFEN 800 MG TABLET: 800 | 10 days supply | Qty: 30 | Fill #0

## 2016-05-30 ENCOUNTER — Ambulatory Visit: Payer: Medicaid Other | Attending: Family Medicine | Admitting: Family Medicine

## 2016-05-30 ENCOUNTER — Encounter: Payer: Self-pay | Admitting: Family Medicine

## 2016-05-30 VITALS — BP 160/90 | HR 79 | Temp 97.8°F | Ht 68.0 in | Wt 187.8 lb

## 2016-05-30 DIAGNOSIS — E1159 Type 2 diabetes mellitus with other circulatory complications: Secondary | ICD-10-CM | POA: Diagnosis not present

## 2016-05-30 DIAGNOSIS — K3184 Gastroparesis: Secondary | ICD-10-CM | POA: Diagnosis not present

## 2016-05-30 DIAGNOSIS — E119 Type 2 diabetes mellitus without complications: Secondary | ICD-10-CM | POA: Diagnosis present

## 2016-05-30 DIAGNOSIS — F329 Major depressive disorder, single episode, unspecified: Secondary | ICD-10-CM | POA: Diagnosis not present

## 2016-05-30 DIAGNOSIS — Z79899 Other long term (current) drug therapy: Secondary | ICD-10-CM | POA: Diagnosis not present

## 2016-05-30 DIAGNOSIS — E118 Type 2 diabetes mellitus with unspecified complications: Secondary | ICD-10-CM

## 2016-05-30 DIAGNOSIS — Z7982 Long term (current) use of aspirin: Secondary | ICD-10-CM | POA: Diagnosis not present

## 2016-05-30 DIAGNOSIS — Z794 Long term (current) use of insulin: Secondary | ICD-10-CM | POA: Diagnosis not present

## 2016-05-30 DIAGNOSIS — IMO0002 Reserved for concepts with insufficient information to code with codable children: Secondary | ICD-10-CM

## 2016-05-30 DIAGNOSIS — E1143 Type 2 diabetes mellitus with diabetic autonomic (poly)neuropathy: Secondary | ICD-10-CM | POA: Diagnosis not present

## 2016-05-30 DIAGNOSIS — E1165 Type 2 diabetes mellitus with hyperglycemia: Secondary | ICD-10-CM

## 2016-05-30 DIAGNOSIS — I10 Essential (primary) hypertension: Secondary | ICD-10-CM | POA: Diagnosis not present

## 2016-05-30 LAB — GLUCOSE, POCT (MANUAL RESULT ENTRY)
POC GLUCOSE: 52 mg/dL — AB (ref 70–99)
POC GLUCOSE: 116 mg/dL — AB (ref 70–99)

## 2016-05-30 LAB — POCT GLYCOSYLATED HEMOGLOBIN (HGB A1C): Hemoglobin A1C: 8.4

## 2016-05-30 MED ORDER — INSULIN GLARGINE 100 UNIT/ML SOLOSTAR PEN: 10.0000 [IU] | PEN_INJECTOR | SUBCUTANEOUS | 11 refills | Status: DC

## 2016-05-30 MED ORDER — METOCLOPRAMIDE HCL 5 MG PO TABS: 5.0000 mg | ORAL_TABLET | Freq: Three times a day (TID) | ORAL | 2 refills | Status: DC

## 2016-05-30 NOTE — Patient Instructions (Addendum)
Shane [name] was seen today for diabetes.  Diagnoses and all orders for this visit:  Uncontrolled type 2 diabetes mellitus with complication, without long-term current use of insulin (HCC) -     Glucose (CBG) -     HgB A1c -     Insulin Glargine (LANTUS SOLOSTAR) 100 UNIT/ML Solostar Pen; Inject 10 Units into the skin every morning.  Diabetic gastroparesis associated with type 2 diabetes mellitus (HCC) -     metoCLOPramide (REGLAN) 5 MG tablet; Take 1 tablet (5 mg total) by mouth 3 (three) times daily before meals.   Your A1c went up a bit  Stop glipizide due to low sugars even on XL Change lantus to AM stick with 10 U for now  Your EGD  From 8/7/2017did show signs of gastroparesis with large amount of food in stomach. Diabetic gastroparesis- start Reglan with meals once diarrhea improves, I anticipate next 3-5 days    Viral GI illness- drink plenty of fluids, continue Zofran for nausea, lomotil for diarrhea  Diabetes blood sugar goals  Fasting (in AM before breakfast, 8 hrs of no eating or drinking (except water or unsweetened coffee or tea): 90-110 2 hrs after meals: < 160,   No low sugars: nothing < 70   F/u in 4 weeks for diabetes and gastroparesis   Dr. Adrian [place]    Gastroparesis Introduction Gastroparesis, also called delayed gastric emptying, is a condition in which food takes longer than normal to empty from the stomach. The condition is usually long-lasting (chronic). What are the causes? This condition may be caused by:  An endocrine disorder, such as hypothyroidism or diabetes. Diabetes is the most common cause of this condition.  A nervous system disease, such as Parkinson disease or multiple sclerosis.  Cancer, infection, or surgery of the stomach or vagus nerve.  A connective tissue disorder, such as scleroderma.  Certain medicines. In most cases, the cause is not known. What increases the risk? This condition is more likely to develop in:  People with certain  disorders, including endocrine disorders, eating disorders, amyloidosis, and scleroderma.  People with certain diseases, including Parkinson disease or multiple sclerosis.  People with cancer or infection of the stomach or vagus nerve.  People who have had surgery on the stomach or vagus nerve.  People who take certain medicines.  Women. What are the signs or symptoms? Symptoms of this condition include:  An early feeling of fullness when eating.  Nausea.  Weight loss.  Vomiting.  Heartburn.  Abdominal bloating.  Inconsistent blood glucose levels.  Lack of appetite.  Acid from the stomach coming up into the esophagus (gastroesophageal reflux).  Spasms of the stomach. Symptoms may come and go. How is this diagnosed? This condition is diagnosed with tests, such as:  Tests that check how long it takes food to move through the stomach and intestines. These tests include:  Upper gastrointestinal (GI) series. In this test, X-rays of the intestines are taken after you drink a liquid. The liquid makes the intestines show up better on the X-rays.  Gastric emptying scintigraphy. In this test, scans are taken after you eat food that contains a small amount of radioactive material.  Wireless capsule GI monitoring system. This test involves swallowing a capsule that records information about movement through the stomach.  Gastric manometry. This test measures electrical and muscular activity in the stomach. It is done with a thin tube that is passed down the throat and into the stomach.  Endoscopy. This test checks for  abnormalities in the lining of the stomach. It is done with a long, thin tube that is passed down the throat and into the stomach.  An ultrasound. This test can help rule out gallbladder disease or pancreatitis as a cause of your symptoms. It uses sound waves to take pictures of the inside of your body. How is this treated? There is no cure for gastroparesis. This  condition may be managed with:  Treatment of the underlying condition causing the gastroparesis.  Lifestyle changes, including exercise and dietary changes. Dietary changes can include:  Changes in what and when you eat.  Eating smaller meals more often.  Eating low-fat foods.  Eating low-fiber forms of high-fiber foods, such as cooked vegetables instead of raw vegetables.  Having liquid foods in place of solid foods. Liquid foods are easier to digest.  Medicines. These may be given to control nausea and vomiting and to stimulate stomach muscles.  Getting food through a feeding tube. This may be done in severe cases.  A gastric neurostimulator. This is a device that is inserted into the body with surgery. It helps improve stomach emptying and control nausea and vomiting. Follow these instructions at home:  Follow your health care provider's instructions about exercise and diet.  Take medicines only as directed by your health care provider. Contact a health care provider if:  Your symptoms do not improve with treatment.  You have new symptoms. Get help right away if:  You have severe abdominal pain that does not improve with treatment.  You have nausea that does not go away.  You cannot keep fluids down. This information is not intended to replace advice given to you by your health care provider. Make sure you discuss any questions you have with your health care provider. Document Released: 06/03/2005 Document Revised: 11/09/2015 Document Reviewed: 05/30/2014  2017 Elsevier

## 2016-05-30 NOTE — Progress Notes (Signed)
Pt is here to follow up on diabetes. Pt is having abdominal pain, vomiting, and fatigue.symptoms since last Thursday.

## 2016-05-30 NOTE — Progress Notes (Signed)
Subjective:  Patient ID: Shane [name], male    DOB: March 17, 1971  Age: 45 y.o. MRN: [id number]  CC: Diabetes   HPI Shane [name] has diabetes with vascular complications, depression, H TN he presents for   1.  Diabetes type 2: he has improved sugars since starting lantus. Fasting CBGs in 110-150.  He still has poor appetite, intermittent low sugars and feeling of early satiety. He continues to take glipizide. He had an EGD on 01/2016 that revealed a large amount of residual food concerning for gastroparesis.  2. GI illness: for past 3 days. Nausea, emesis and diarrhea. Non-bloody. Last episode this AM. Wife with stomach upset. No fever or chills. Taking OTC imodium and zofran for symptoms.   Social History  Substance Use Topics  . Smoking status: Never Smoker  . Smokeless tobacco: Never Used  . Alcohol use No   Past Surgical History:  Procedure Laterality Date  . AMPUTATION Left 08/03/2015   Procedure: AMPUTATION LEFT GREAT TOE;  Surgeon: Newt Minion, MD;  Location: [place];  Service: Orthopedics;  Laterality: Left;  . AMPUTATION Left 12/02/2015   Procedure: amputation of left 2nd digit  . AMPUTATION Left 04/10/2016   Procedure: Left 2nd Toe Amputation at MTP Joint;  Surgeon: Newt Minion, MD;  Location: [facility];  Service: Orthopedics;  Laterality: Left;  . APPLICATION OF WOUND VAC Left 12/19/2015   Procedure: APPLICATION OF WOUND VAC;  Surgeon: Meredith Pel, MD;  Location: [facility];  Service: Orthopedics;  Laterality: Left;  . CIRCUMCISION    . COLONOSCOPY N/A 01/22/2016   Procedure: COLONOSCOPY;  Surgeon: Gatha Mayer, MD;  Location: [facility];  Service: Endoscopy;  Laterality: N/A;  . ESOPHAGOGASTRODUODENOSCOPY N/A 01/19/2016   Procedure: ESOPHAGOGASTRODUODENOSCOPY (EGD);  Surgeon: Manus Gunning, MD;  Location: [facility];  Service: Gastroenterology;  Laterality: N/A;  . ESOPHAGOGASTRODUODENOSCOPY N/A 01/22/2016   Procedure: ESOPHAGOGASTRODUODENOSCOPY (EGD);  Surgeon:  Gatha Mayer, MD;  Location: [facility] ENDOSCOPY;  Service: Endoscopy;  Laterality: N/A;  . I&D EXTREMITY Left 12/02/2015   Procedure: IRRIGATION AND DEBRIDEMENT OF FOOT; LEFT SECOND TOE AMPUTATION;  Surgeon: Meredith Pel, MD;  Location: [facility];  Service: Orthopedics;  Laterality: Left;  . I&D EXTREMITY Left 12/19/2015   Procedure: I & D LEFT FOOT WITH BEADS ;  Surgeon: Meredith Pel, MD;  Location: [place];  Service: Orthopedics;  Laterality: Left;  . I&D EXTREMITY Right 01/17/2016   Procedure: IRRIGATION AND DEBRIDEMENT RIGHT FOOT;  Surgeon: Newt Minion, MD;  Location: [facility];  Service: Orthopedics;  Laterality: Right;  . INCISION AND DRAINAGE FOOT Right 01/17/2016  . INGUINAL HERNIA REPAIR Bilateral ~ 1983- ~ 1986  . TONSILLECTOMY  ~ 1985    Outpatient Medications Prior to Visit  Medication Sig Dispense Refill  . amLODipine (NORVASC) 10 MG tablet Take 1 tablet (10 mg total) by mouth daily. 30 tablet 0  . aspirin EC 81 MG tablet Take 1 tablet (81 mg total) by mouth daily. 30 tablet 11  . atorvastatin (LIPITOR) 80 MG tablet Take 1 tablet (80 mg total) by mouth every morning. 30 tablet 5  . Blood Glucose Monitoring Suppl (TRUE METRIX METER) w/Device KIT Use as directed 1 kit 0  . carvedilol (COREG) 12.5 MG tablet TAKE 1 TABLET BY MOUTH 2 TIMES DAILY WITH A MEAL 60 tablet 3  . divalproex (DEPAKOTE) 500 MG DR tablet Take 2 tablets (1,000 mg total) by mouth at bedtime. 30 tablet 5  . ferrous sulfate 325 (  65 FE) MG tablet Take 1 tablet (325 mg total) by mouth 2 (two) times daily with a meal. (Patient taking differently: Take 325 mg by mouth daily with breakfast. ) 60 tablet 5  . FLUoxetine (PROZAC) 20 MG tablet Take 1 tablet (20 mg total) by mouth every morning. 30 tablet 5  . gabapentin (NEURONTIN) 100 MG capsule Take 2 capsules (200 mg total) by mouth 3 (three) times daily. 90 capsule 0  . glipiZIDE (GLIPIZIDE XL) 10 MG 24 hr tablet Take 1 tablet (10 mg total) by mouth daily with breakfast. 90  tablet 3  . glucose blood (TRUE METRIX BLOOD GLUCOSE TEST) test strip Use as instructed 100 each 12  . ibuprofen (ADVIL,MOTRIN) 800 MG tablet TAKE 1 TABLET BY MOUTH EVERY 8 HOURS AS NEEDED. 30 tablet 0  . Insulin Glargine (LANTUS SOLOSTAR) 100 UNIT/ML Solostar Pen Inject 10-30 Units into the skin every evening. Titration of lantus 3 pen 11  . Insulin Pen Needle (B-D ULTRAFINE III SHORT PEN) 31G X 8 MM MISC 1 application by Does not apply route daily. 30 each 11  . lisinopril (PRINIVIL,ZESTRIL) 40 MG tablet TAKE 1 TABLET BY MOUTH DAILY. 30 tablet 2  . metFORMIN (GLUCOPHAGE) 1000 MG tablet Take 1 tablet (1,000 mg total) by mouth 2 (two) times daily with a meal. 60 tablet 5  . ondansetron (ZOFRAN ODT) 8 MG disintegrating tablet Take 1 tablet (8 mg total) by mouth every 8 (eight) hours as needed for nausea or vomiting. 30 tablet 0  . pantoprazole (PROTONIX) 40 MG tablet Take 10 mg by mouth daily.  5  . traZODone (DESYREL) 100 MG tablet Take 1 tablet (100 mg total) by mouth at bedtime. 30 tablet 5  . triamcinolone ointment (KENALOG) 0.5 % Apply 1 application topically 2 (two) times daily. 60 g 0  . TRUEPLUS LANCETS 28G MISC Use as directed 100 each 12  . Vitamin D, Ergocalciferol, (DRISDOL) 50000 units CAPS capsule Take 1 capsule (50,000 Units total) by mouth every 30 (thirty) days. Every 30 days 12 capsule 1   No facility-administered medications prior to visit.     ROS Review of Systems  Constitutional: Positive for fatigue. Negative for chills, fever and unexpected weight change.  HENT: Positive for hearing loss and tinnitus.   Eyes: Negative for visual disturbance.  Respiratory: Negative for cough, chest tightness and shortness of breath.   Cardiovascular: Negative for chest pain, palpitations and leg swelling.  Gastrointestinal: Positive for abdominal pain, diarrhea, nausea and vomiting. Negative for abdominal distention, anal bleeding, blood in stool, constipation and rectal pain.    Endocrine: Negative for polydipsia, polyphagia and polyuria.  Musculoskeletal: Positive for arthralgias. Negative for back pain, gait problem, joint swelling, myalgias and neck pain.  Skin: Positive for wound (dorsum on R foot ). Negative for rash.  Allergic/Immunologic: Negative for immunocompromised state.  Neurological: Negative for dizziness and numbness.  Hematological: Negative for adenopathy. Does not bruise/bleed easily.  Psychiatric/Behavioral: Negative for dysphoric mood, sleep disturbance and suicidal ideas. The patient is not nervous/anxious.     Objective:  BP (!) 160/90 (BP Location: Right Arm, Patient Position: Sitting, Cuff Size: Small)   Pulse 79   Temp 97.8 F (36.6 C) (Oral)   Ht 5' 8"  (1.727 m)   Wt 187 lb 12.8 oz (85.2 kg)   SpO2 100%   BMI 28.55 kg/m   BP/Weight 05/30/2016 05/22/2016 85/01/8501  Systolic BP 774 - 128  Diastolic BP 90 - 93  Wt. (Lbs) 187.8 191 -  BMI 28.55 29.04 -   Physical Exam  Constitutional: He appears well-developed and well-nourished. No distress.  HENT:  Head: Normocephalic and atraumatic.  Right Ear: Tympanic membrane, external ear and ear canal normal.  Left Ear: Tympanic membrane, external ear and ear canal normal.  Neck: Normal range of motion. Neck supple.  Cardiovascular: Normal rate, regular rhythm, normal heart sounds and intact distal pulses.   Pulses:      Femoral pulses are 1+ on the right side, and 1+ on the left side.      Popliteal pulses are 1+ on the right side, and 1+ on the left side.       Dorsalis pedis pulses are 1+ on the right side, and 1+ on the left side.       Posterior tibial pulses are 1+ on the right side, and 1+ on the left side.  Pulmonary/Chest: Effort normal and breath sounds normal.  Musculoskeletal: He exhibits no edema.       Feet:  Neurological: He is alert.  Skin: Skin is warm and dry. No rash noted. No erythema.  Dry and flaking in lower legs   Psychiatric: He has a normal mood and  affect.   Lab Results  Component Value Date   HGBA1C 7.8 (H) 03/09/2016    Lab Results  Component Value Date   HGBA1C 8.4 05/30/2016    CBG 58  Treated with soda and crackers  Repeat CBG 116   Assessment & Plan:  Shane [name] was seen today for diabetes.  Diagnoses and all orders for this visit:  Uncontrolled type 2 diabetes mellitus with complication, without long-term current use of insulin (HCC) -     Glucose (CBG) -     HgB A1c -     Insulin Glargine (LANTUS SOLOSTAR) 100 UNIT/ML Solostar Pen; Inject 10 Units into the skin every morning. -     POCT glucose (manual entry)  Diabetic gastroparesis associated with type 2 diabetes mellitus (HCC) -     metoCLOPramide (REGLAN) 5 MG tablet; Take 1 tablet (5 mg total) by mouth 3 (three) times daily before meals.   There are no diagnoses linked to this encounter.  No orders of the defined types were placed in this encounter.   Follow-up: Return in about 4 weeks (around 06/27/2016) for diabetes  and gastroparesis .   Boykin Nearing MD

## 2016-05-31 MED FILL — METOCLOPRAMIDE 5 MG TABLET: 5 | 30 days supply | Qty: 90 | Fill #0

## 2016-05-31 MED FILL — LANTUS SOLOSTAR 100 UNITS/M: 100 | 30 days supply | Qty: 3 | Fill #0

## 2016-06-02 NOTE — Assessment & Plan Note (Signed)
Diabetes with decline in control Rise in A1c  Plan: D.c glipizide due to low sugars Continue metfomrin Continue lantus 10 U daily

## 2016-06-02 NOTE — Assessment & Plan Note (Signed)
Gastroparesis in chronic uncontrolled diabetes  Add reglan Continue CBG control

## 2016-06-04 ENCOUNTER — Ambulatory Visit: Payer: Medicaid Other | Attending: Family Medicine | Admitting: Audiology

## 2016-06-04 DIAGNOSIS — H903 Sensorineural hearing loss, bilateral: Secondary | ICD-10-CM | POA: Diagnosis present

## 2016-06-04 DIAGNOSIS — R292 Abnormal reflex: Secondary | ICD-10-CM

## 2016-06-04 DIAGNOSIS — H9192 Unspecified hearing loss, left ear: Secondary | ICD-10-CM | POA: Diagnosis present

## 2016-06-04 DIAGNOSIS — H93299 Other abnormal auditory perceptions, unspecified ear: Secondary | ICD-10-CM

## 2016-06-04 DIAGNOSIS — H748X9 Other specified disorders of middle ear and mastoid, unspecified ear: Secondary | ICD-10-CM

## 2016-06-04 DIAGNOSIS — H9191 Unspecified hearing loss, right ear: Secondary | ICD-10-CM

## 2016-06-04 NOTE — Patient Instructions (Signed)
Shane [name] has a moderate hearing loss on the right side and a moderately-severe hearing loss on the left side. The hearing loss is sensorineural bilaterally.  Shane [name] has excellent word recognition on the right side and good word recognition on the left side at loud levels, equivalent to shouting within a few inches of his ear.  Shane [name] also has recruitment on the right side.  Since this change has occurred over the past year, referral to an Ear, Nose and Throat specialist is needed.  Shane [name] also needs a hearing aid evaluation.  [employer] was discussed and they will be contacting him in a few weeks for a free home amplified telephone.  [name] L. Heide Spark, Au.D., CCC-A Doctor of Audiology 06/04/2016

## 2016-06-04 NOTE — Procedures (Signed)
[facility]  [place] [place], [place] [address]  [phone]   Audiological Evaluation  Patient Name: Shane [name]   Status: Outpatient   DOB: Dec 11, 1970    Diagnosis: Hearing Loss                 MRN: [id number] Date:  06/04/2016     Referent: Minerva Ends, MD  History: Shane [name] was seen for an audiological evaluation. Accompanied by: self Primary Concern: Gradual hearing loss since January 2016 has noticed that he hasn't heard, or repeats something different than what people are saying to him.  The TV volume is higher.  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  Had a "minor stroke in March 2016" since then has "noticed hearing and eye sight going".  History of hearing problems: N History of ear infections:  Y - as a child, none recently. History of ear surgery or "tubes" : N History of dizziness/vertigo:   Y - about two years. Feels like "room is spinning or moving". Can happen when standing or walking - sitting down helps. History of balance issues:  Y developed in past 1-2 years - especially after having the "stroke". Had "two toes amputated because of diabetes this year". Tinnitus: Y - Buzzing (like a bee or vibration) or ringing in one or both ears - sometimes with a "sharp pain".  Sound sensitivity: Y - this is a new symptom.   History of occupational noise exposure: N History of hypertension: Y History of diabetes:  Y - injections and pills. Family history of hearing loss:  N Other concerns: Anemia,  Heart disease (lack of oxygen).  Evaluation: Conventional pure tone audiometry from 250Hz  - 8000Hz  with using insert earphones. The hearing loss is sensorineural bilaterally Hearing Thresholds: Right ear:  Thresholds of 40-50 dBHL  Left ear:    Thresholds of 55-70 dBHL Reliability is good Speech detection thresholds using recorded multitalker noise:  Right ear:  35/40 dBHL.  Left ear:  55 dBHL Word recognition (at comfortably loud volumes) using recorded NU-6 word lists, in quiet.  Right ear: 94% at 80DBHL with 70 dBHL contralateral masking.  Left ear:   80% at 90 dBHL with 80 dBHL contralateral masking.  Tympanometry (middle ear function) shows normal middle ear volume and pressure with shallow compliance bilaterally (Type As).  Ipsilateral acoustic reflexes range from present and within normal limits at 500Hz  - 10000Hz , elevated at 2000Hz  and nor response at 4000Hz  bilaterally.  CONCLUSION:    Shane [name] has a moderate hearing loss on the right side and a moderately-severe hearing loss on the left side. The hearing loss is sensorineural bilaterally.  Shane [name] has excellent word recognition on the right side and good word recognition on the left side at loud levels, equivalent to shouting within a few inches of his ear.  Shane [name] also has recruitment on the right side.  Since this change has occurred over the past year, referral to an Ear, Nose and Throat specialist is needed for the hearing loss and complaints of vertigo and tinnitus.  Shane [name] also needs a hearing aid evaluation. Amplification helps make the signal louder and often improves hearing and word recognition.  Amplification has many forms including hearing aids in one or both ears, an assistive listening device which have a microphone and speaker such as a small handheld device and/or even a surround sound system of speakers.  Amplification may be  covered by some insurances, but not all.  It is important to note that hearing aids must be individually fit according to the hearing test results and the ear shape.  Audiologists and hearing aid dealers in [place] must be licensed in order to dispense hearing aids.  In addition, a trial period is mandated by law in our state because often amplification must be tried and then evaluated in order to determine benefit.  There are many excellent choices when  it comes to amplification in our area and providers are listed in the phone book under hearing aids, there ar audiologists in private practice, those affiliated with Ear, Nose and Throat physicians, and there are audiologists located at [employer].    Finally, to help [place] hear on the telephone, [facility] telephone was discussed.  Antony Madura requested for them to contact him in a few weeks to discuss the free home amplified telephone.  RECOMMENDATIONS: 1.   Refer to [facility] and Throat specialist for further evaluation of the a) vertigo and b) recently developing hearing loss, that may or may not be progressive and c) tinnitus. 2.   Monitor hearing closely with a repeat audiological evaluation in 3-6 months (earlier if there is any change in hearing or ear pressure).  This appointment may be completed her or at the ENT office. 3.   A hearing aid evaluation following evaluation by the ENT. 4.  Strategies that help improve hearing include: A) Face the speaker directly. Optimal is having the speakers face well - lit.  Unless amplified, being within 3-6 feet of the speaker will enhance word recognition. B) Avoid having the speaker back-lit as this will minimize the ability to use cues from lip-reading, facial expression and gestures. C)  Word recognition is poorer in background noise. For optimal word recognition, turn off the TV, radio or noisy fan when engaging in conversation. In a restaurant, try to sit away from noise sources and close to the primary speaker.  D)  Ask for topic clarification from time to time in order to remain in the conversation.  Most people don't mind repeating or clarifying a point when asked.  If needed, explain the difficulty hearing in background noise or hearing loss. 5.   Use hearing protection during noisy activities such as using a weed eater, moving the lawn, shooting, etc.    Musician's plugs, are available from [employer] for music related hearing protection  because there is no distortion.  Other hearing protection, such as sponge plugs (available at pharmacies) or earmuffs (available at sporting goods stores or department stores such as [employer]) are useful for noisy activities and venues.  Deborah L. Heide Spark, Au.D., CCC-A Doctor of Audiology 06/04/2016

## 2016-06-05 ENCOUNTER — Ambulatory Visit: Payer: Medicaid Other | Admitting: Audiology

## 2016-06-06 ENCOUNTER — Encounter (INDEPENDENT_AMBULATORY_CARE_PROVIDER_SITE_OTHER): Payer: Self-pay | Admitting: Orthopedic Surgery

## 2016-06-06 ENCOUNTER — Ambulatory Visit: Payer: Medicaid Other | Admitting: Audiology

## 2016-06-06 ENCOUNTER — Ambulatory Visit (INDEPENDENT_AMBULATORY_CARE_PROVIDER_SITE_OTHER): Payer: Medicaid Other | Admitting: Orthopedic Surgery

## 2016-06-06 VITALS — Ht 68.0 in | Wt 187.0 lb

## 2016-06-06 DIAGNOSIS — L02612 Cutaneous abscess of left foot: Secondary | ICD-10-CM

## 2016-06-06 NOTE — Progress Notes (Signed)
Office Visit Note   Patient: Shane [name]           Date of Birth: 11/27/1970           MRN: [id number] Visit Date: 06/06/2016              Requested by: Boykin Nearing, MD [address] [place], [place] [address] PCP: Minerva Ends, MD   Assessment & Plan: Visit Diagnoses:  1. Abscess of third toe, left     Plan: We'll have the patient take doxycycline 100 mg twice a day using antibiotic ointment and a Band-Aid. There is no sausage digit swelling the ulcer does not probe to bone most likely this was a paronychial infection reevaluate in 2 weeks  Follow-Up Instructions: Return in about 2 weeks (around 06/20/2016).   Orders:  No orders of the defined types were placed in this encounter.  No orders of the defined types were placed in this encounter.     Procedures: No procedures performed   Clinical Data: No additional findings.   Subjective: Chief Complaint  Patient presents with  . Right Foot - Follow-up    Right foot dorsal wound, status post I&D 12/19/15   . Left Foot - Follow-up    Left 2nd toe amputation at MTP joint 04/10/16      Right foot dorsal wound, status post I&D 12/19/15  and Left 2nd toe amputation at MTP joint 04/10/16 ambulates with crutches and post op shoes. Pt states that the nail on the third toe of the left foot came off this morning and that the toe has no feeling at all. He states that the 4th and 5th toes are painful and swollen. He is afraid that the foot may be infected and wants to have this checked.       Review of Systems   Objective: Vital Signs: Ht 5\' 8"  (1.727 m)   Wt 187 lb (84.8 kg)   BMI 28.43 kg/m   Physical Exam examination patient is alert oriented no adenopathy well-dressed normal affect normal respiratory effort ambulates with a rolling walker. Patient status post great toe and second toe amputation the left and this is healed nicely. Patient has good pulses he does not have protective sensation he denies any  pain of the third toe. Examination the nail is completely been avulsed there is good granulation tissue this does not probe to bone there is no purulent drainage at this time the patient reports he did have some drainage. Patient either has a resolving paronychial infection or may have deep osteomyelitis from the toe.  Ortho Exam  Specialty Comments:  No specialty comments available.  Imaging: No results found.   PMFS History: Patient Active Problem List   Diagnosis Date Noted  . Diabetic gastroparesis ([facility]) 05/30/2016  . Chronic ulcer of left foot ([facility]) 05/22/2016  . Dermatitis 04/26/2016  . Buzzing in ear, right 03/26/2016  . Joint pain 03/11/2016  . Weakness 03/08/2016  . Dizziness 03/03/2016  . Diabetic ulcer of right foot associated with type 2 diabetes mellitus, with necrosis of muscle ([facility]) 02/21/2016  . Anemia, iron deficiency   . Chronic diastolic CHF (congestive heart failure), NYHA class 1 ([facility]) 12/30/2015  . Diabetes mellitus type 2, uncontrolled, with complications ([facility]) [id number]  . Depression with anxiety 12/01/2015  . Pain in the chest 12/01/2015  . Insomnia 11/21/2015  . GERD (gastroesophageal reflux disease) 08/18/2015  . Amputated toe of left foot ([facility]) 08/18/2015  . Abscess of third toe,  left 08/01/2015  . Erectile dysfunction 07/04/2015  . Anemia of infection and chronic disease 04/24/2015  . Left arm weakness 04/02/2015  . Sensory disturbance 04/02/2015  . Essential hypertension 04/02/2015  . Hemispheric carotid artery syndrome   . HLD (hyperlipidemia)   . Headache    Past Medical History:  Diagnosis Date  . Anemia   . Bipolar disorder ([facility])   . Chest pain    a. 2015 Reportedly normal stress test in [place].  [facility] Chronic diastolic CHF (congestive heart failure) ([facility])    a.03/2015 Echo: EF 55-60%, Gr 1 DD, mild MR, triv PR.  . Depression   . Depression with anxiety   . Dyspnea    with exertion  . GERD (gastroesophageal reflux disease)   .  Hyperlipidemia   . Hypertension    a. 08/2014 Admitted with hypertensive urgency.  . Insomnia   . Internal carotid artery stenosis   . Lower GI bleed 07/04/2015  . Neuropathy ([facility])   . Refusal of blood transfusions as patient is Jehovah's Witness   . TIA (transient ischemic attack) 08/2014; 03/2015   a. 08/2014 in setting of hypertensive urgency.  . Type II diabetes mellitus ([facility])    started insulin spring 2016, Type II  . Vitamin D deficiency spring 2016    Family History  Problem Relation Age of Onset  . Hypertension Mother   . Diabetes Mother   . Hyperlipidemia Mother   . Heart disease Mother     s/p pacemaker  . Diabetes Father   . Hypertension Father   . Stroke Father   . Heart attack Father     first MI @ 28.  . Stroke Brother     Past Surgical History:  Procedure Laterality Date  . AMPUTATION Left 08/03/2015   Procedure: AMPUTATION LEFT GREAT TOE;  Surgeon: Newt Minion, MD;  Location: [facility];  Service: Orthopedics;  Laterality: Left;  . AMPUTATION Left 12/02/2015   Procedure: amputation of left 2nd digit  . AMPUTATION Left 04/10/2016   Procedure: Left 2nd Toe Amputation at MTP Joint;  Surgeon: Newt Minion, MD;  Location: [facility];  Service: Orthopedics;  Laterality: Left;  . APPLICATION OF WOUND VAC Left 12/19/2015   Procedure: APPLICATION OF WOUND VAC;  Surgeon: Meredith Pel, MD;  Location: [facility];  Service: Orthopedics;  Laterality: Left;  . CIRCUMCISION    . COLONOSCOPY N/A 01/22/2016   Procedure: COLONOSCOPY;  Surgeon: Gatha Mayer, MD;  Location: [facility];  Service: Endoscopy;  Laterality: N/A;  . ESOPHAGOGASTRODUODENOSCOPY N/A 01/19/2016   Procedure: ESOPHAGOGASTRODUODENOSCOPY (EGD);  Surgeon: Manus Gunning, MD;  Location: [facility];  Service: Gastroenterology;  Laterality: N/A;  . ESOPHAGOGASTRODUODENOSCOPY N/A 01/22/2016   Procedure: ESOPHAGOGASTRODUODENOSCOPY (EGD);  Surgeon: Gatha Mayer, MD;  Location: [facility];  Service: Endoscopy;   Laterality: N/A;  . I&D EXTREMITY Left 12/02/2015   Procedure: IRRIGATION AND DEBRIDEMENT OF FOOT; LEFT SECOND TOE AMPUTATION;  Surgeon: Meredith Pel, MD;  Location: [facility];  Service: Orthopedics;  Laterality: Left;  . I&D EXTREMITY Left 12/19/2015   Procedure: I & D LEFT FOOT WITH BEADS ;  Surgeon: Meredith Pel, MD;  Location: [facility];  Service: Orthopedics;  Laterality: Left;  . I&D EXTREMITY Right 01/17/2016   Procedure: IRRIGATION AND DEBRIDEMENT RIGHT FOOT;  Surgeon: Newt Minion, MD;  Location: [facility];  Service: Orthopedics;  Laterality: Right;  . INCISION AND DRAINAGE FOOT Right 01/17/2016  . INGUINAL HERNIA REPAIR Bilateral ~ 1983- ~  [place]  ~ 1985   Social History   Occupational History  . Not on file.   Social History Main Topics  . Smoking status: Never Smoker  . Smokeless tobacco: Never Used  . Alcohol use No  . Drug use: No  . Sexual activity: No

## 2016-06-07 ENCOUNTER — Telehealth: Payer: Self-pay | Admitting: Family Medicine

## 2016-06-07 DIAGNOSIS — H903 Sensorineural hearing loss, bilateral: Secondary | ICD-10-CM

## 2016-06-07 NOTE — Telephone Encounter (Signed)
Patient called the office to speak with PCP regarding his results from the Audiology as well as be referred to EMT. Please follow up.   Thank you.

## 2016-06-08 ENCOUNTER — Encounter (HOSPITAL_COMMUNITY): Payer: Self-pay | Admitting: Emergency Medicine

## 2016-06-08 ENCOUNTER — Emergency Department (HOSPITAL_COMMUNITY): Payer: Medicaid Other

## 2016-06-08 ENCOUNTER — Inpatient Hospital Stay (HOSPITAL_COMMUNITY)
Admission: EM | Admit: 2016-06-08 | Discharge: 2016-06-10 | DRG: 617 | Disposition: A | Discharge status: Home / Self Care | Payer: Medicaid Other | Attending: Internal Medicine | Admitting: Internal Medicine

## 2016-06-08 DIAGNOSIS — E1151 Type 2 diabetes mellitus with diabetic peripheral angiopathy without gangrene: Secondary | ICD-10-CM | POA: Diagnosis present

## 2016-06-08 DIAGNOSIS — Z89412 Acquired absence of left great toe: Secondary | ICD-10-CM

## 2016-06-08 DIAGNOSIS — Z91018 Allergy to other foods: Secondary | ICD-10-CM | POA: Diagnosis not present

## 2016-06-08 DIAGNOSIS — E118 Type 2 diabetes mellitus with unspecified complications: Secondary | ICD-10-CM

## 2016-06-08 DIAGNOSIS — Z7982 Long term (current) use of aspirin: Secondary | ICD-10-CM | POA: Diagnosis not present

## 2016-06-08 DIAGNOSIS — I5032 Chronic diastolic (congestive) heart failure: Secondary | ICD-10-CM

## 2016-06-08 DIAGNOSIS — E11622 Type 2 diabetes mellitus with other skin ulcer: Secondary | ICD-10-CM | POA: Diagnosis present

## 2016-06-08 DIAGNOSIS — K3184 Gastroparesis: Secondary | ICD-10-CM | POA: Diagnosis present

## 2016-06-08 DIAGNOSIS — D649 Anemia, unspecified: Secondary | ICD-10-CM | POA: Diagnosis present

## 2016-06-08 DIAGNOSIS — F319 Bipolar disorder, unspecified: Secondary | ICD-10-CM | POA: Diagnosis not present

## 2016-06-08 DIAGNOSIS — E785 Hyperlipidemia, unspecified: Secondary | ICD-10-CM | POA: Diagnosis present

## 2016-06-08 DIAGNOSIS — Z89422 Acquired absence of other left toe(s): Secondary | ICD-10-CM

## 2016-06-08 DIAGNOSIS — Z9109 Other allergy status, other than to drugs and biological substances: Secondary | ICD-10-CM

## 2016-06-08 DIAGNOSIS — Z8349 Family history of other endocrine, nutritional and metabolic diseases: Secondary | ICD-10-CM

## 2016-06-08 DIAGNOSIS — L98496 Non-pressure chronic ulcer of skin of other sites with bone involvement without evidence of necrosis: Secondary | ICD-10-CM | POA: Diagnosis present

## 2016-06-08 DIAGNOSIS — B9689 Other specified bacterial agents as the cause of diseases classified elsewhere: Secondary | ICD-10-CM

## 2016-06-08 DIAGNOSIS — E875 Hyperkalemia: Secondary | ICD-10-CM

## 2016-06-08 DIAGNOSIS — Z794 Long term (current) use of insulin: Secondary | ICD-10-CM

## 2016-06-08 DIAGNOSIS — Z8673 Personal history of transient ischemic attack (TIA), and cerebral infarction without residual deficits: Secondary | ICD-10-CM

## 2016-06-08 DIAGNOSIS — E1143 Type 2 diabetes mellitus with diabetic autonomic (poly)neuropathy: Secondary | ICD-10-CM | POA: Diagnosis present

## 2016-06-08 DIAGNOSIS — F418 Other specified anxiety disorders: Secondary | ICD-10-CM | POA: Diagnosis present

## 2016-06-08 DIAGNOSIS — E1140 Type 2 diabetes mellitus with diabetic neuropathy, unspecified: Secondary | ICD-10-CM

## 2016-06-08 DIAGNOSIS — R739 Hyperglycemia, unspecified: Secondary | ICD-10-CM

## 2016-06-08 DIAGNOSIS — I110 Hypertensive heart disease with heart failure: Secondary | ICD-10-CM

## 2016-06-08 DIAGNOSIS — M86172 Other acute osteomyelitis, left ankle and foot: Secondary | ICD-10-CM | POA: Diagnosis present

## 2016-06-08 DIAGNOSIS — Z833 Family history of diabetes mellitus: Secondary | ICD-10-CM

## 2016-06-08 DIAGNOSIS — M86272 Subacute osteomyelitis, left ankle and foot: Secondary | ICD-10-CM | POA: Diagnosis present

## 2016-06-08 DIAGNOSIS — K219 Gastro-esophageal reflux disease without esophagitis: Secondary | ICD-10-CM | POA: Diagnosis present

## 2016-06-08 DIAGNOSIS — Z91011 Allergy to milk products: Secondary | ICD-10-CM | POA: Diagnosis not present

## 2016-06-08 DIAGNOSIS — M79672 Pain in left foot: Secondary | ICD-10-CM | POA: Diagnosis present

## 2016-06-08 DIAGNOSIS — Z8249 Family history of ischemic heart disease and other diseases of the circulatory system: Secondary | ICD-10-CM

## 2016-06-08 DIAGNOSIS — M869 Osteomyelitis, unspecified: Secondary | ICD-10-CM | POA: Diagnosis not present

## 2016-06-08 DIAGNOSIS — Z79899 Other long term (current) drug therapy: Secondary | ICD-10-CM

## 2016-06-08 DIAGNOSIS — E1169 Type 2 diabetes mellitus with other specified complication: Secondary | ICD-10-CM | POA: Diagnosis present

## 2016-06-08 DIAGNOSIS — I10 Essential (primary) hypertension: Secondary | ICD-10-CM | POA: Diagnosis present

## 2016-06-08 DIAGNOSIS — Z823 Family history of stroke: Secondary | ICD-10-CM

## 2016-06-08 DIAGNOSIS — E1165 Type 2 diabetes mellitus with hyperglycemia: Secondary | ICD-10-CM | POA: Diagnosis present

## 2016-06-08 DIAGNOSIS — IMO0002 Reserved for concepts with insufficient information to code with codable children: Secondary | ICD-10-CM | POA: Diagnosis present

## 2016-06-08 LAB — BASIC METABOLIC PANEL
Anion gap: 5 (ref 5–15)
BUN: 18 mg/dL (ref 6–20)
CHLORIDE: 103 mmol/L (ref 101–111)
CO2: 29 mmol/L (ref 22–32)
CREATININE: 1.19 mg/dL (ref 0.61–1.24)
Calcium: 9.3 mg/dL (ref 8.9–10.3)
GFR calc Af Amer: >60 mL/min (ref >60)
GFR calc non Af Amer: >60 mL/min (ref >60)
GLUCOSE: 303 mg/dL — AB (ref 65–99)
Potassium: 5.5 mmol/L — ABNORMAL HIGH (ref 3.5–5.1)
Sodium: 137 mmol/L (ref 135–145)

## 2016-06-08 LAB — CBC WITH DIFFERENTIAL/PLATELET
BASOS ABS: 0 10*3/uL (ref 0.0–0.1)
Basophils Relative: 0 %
Eosinophils Absolute: 0.2 10*3/uL (ref 0.0–0.7)
Eosinophils Relative: 3 %
HEMATOCRIT: 31 % — AB (ref 39.0–52.0)
Hemoglobin: 10 g/dL — ABNORMAL LOW (ref 13.0–17.0)
LYMPHS PCT: 22 %
Lymphs Abs: 1.7 10*3/uL (ref 0.7–4.0)
MCH: 29.2 pg (ref 26.0–34.0)
MCHC: 32.3 g/dL (ref 30.0–36.0)
MCV: 90.6 fL (ref 78.0–100.0)
MONO ABS: 0.4 10*3/uL (ref 0.1–1.0)
MONOS PCT: 6 %
NEUTROS ABS: 5.5 10*3/uL (ref 1.7–7.7)
Neutrophils Relative %: 69 %
Platelets: 235 10*3/uL (ref 150–400)
RBC: 3.42 MIL/uL — ABNORMAL LOW (ref 4.22–5.81)
RDW: 14.9 % (ref 11.5–15.5)
WBC: 7.8 10*3/uL (ref 4.0–10.5)

## 2016-06-08 LAB — LACTIC ACID, PLASMA
Lactic Acid, Venous: 1.6 mmol/L (ref 0.5–1.9)
Lactic Acid, Venous: 0.7 mmol/L (ref 0.5–1.9)

## 2016-06-08 LAB — SEDIMENTATION RATE: SED RATE: 27 mm/h — AB (ref 0–16)

## 2016-06-08 LAB — GLUCOSE, CAPILLARY: Glucose-Capillary: 153 mg/dL — ABNORMAL HIGH (ref 65–99)

## 2016-06-08 MED ORDER — HYDROMORPHONE HCL 2 MG/ML IJ SOLN
1.0000 mg | INTRAMUSCULAR | Status: DC | PRN
  Administered 2016-06-08 – 2016-06-09 (×3): 1 mg via INTRAVENOUS
  Filled 2016-06-08 (×3): qty 1

## 2016-06-08 MED ORDER — SODIUM POLYSTYRENE SULFONATE 15 GM/60ML PO SUSP
15.0000 g | Freq: Once | ORAL | Status: AC
  Administered 2016-06-08: 15 g via ORAL
  Filled 2016-06-08: qty 60

## 2016-06-08 MED ORDER — GABAPENTIN 100 MG PO CAPS
200.0000 mg | ORAL_CAPSULE | Freq: Three times a day (TID) | ORAL | Status: DC
  Administered 2016-06-08 – 2016-06-10 (×5): 200 mg via ORAL
  Filled 2016-06-08 (×5): qty 2

## 2016-06-08 MED ORDER — MORPHINE SULFATE (PF) 4 MG/ML IV SOLN
4.0000 mg | Freq: Once | INTRAVENOUS | Status: AC
  Administered 2016-06-08: 4 mg via INTRAVENOUS
  Filled 2016-06-08: qty 1

## 2016-06-08 MED ORDER — FLUOXETINE HCL 20 MG PO CAPS
20.0000 mg | ORAL_CAPSULE | Freq: Every day | ORAL | Status: DC
  Administered 2016-06-08 – 2016-06-10 (×3): 20 mg via ORAL
  Filled 2016-06-08 (×5): qty 1

## 2016-06-08 MED ORDER — LISINOPRIL 40 MG PO TABS
40.0000 mg | ORAL_TABLET | Freq: Every day | ORAL | Status: DC
  Administered 2016-06-08 – 2016-06-10 (×3): 40 mg via ORAL
  Filled 2016-06-08 (×3): qty 1

## 2016-06-08 MED ORDER — OXYCODONE HCL 5 MG PO TABS
5.0000 mg | ORAL_TABLET | ORAL | Status: DC | PRN
  Administered 2016-06-09: 5 mg via ORAL
  Filled 2016-06-08: qty 1

## 2016-06-08 MED ORDER — TRAZODONE HCL 100 MG PO TABS
100.0000 mg | ORAL_TABLET | Freq: Every day | ORAL | Status: DC
  Administered 2016-06-08 – 2016-06-09 (×2): 100 mg via ORAL
  Filled 2016-06-08 (×2): qty 1

## 2016-06-08 MED ORDER — PANTOPRAZOLE SODIUM 40 MG PO TBEC
40.0000 mg | DELAYED_RELEASE_TABLET | Freq: Every day | ORAL | Status: DC
  Administered 2016-06-08 – 2016-06-10 (×3): 40 mg via ORAL
  Filled 2016-06-08 (×3): qty 1

## 2016-06-08 MED ORDER — METOCLOPRAMIDE HCL 10 MG PO TABS
5.0000 mg | ORAL_TABLET | Freq: Three times a day (TID) | ORAL | Status: DC
  Administered 2016-06-09 – 2016-06-10 (×4): 5 mg via ORAL
  Filled 2016-06-08 (×4): qty 1

## 2016-06-08 MED ORDER — INSULIN GLARGINE 100 UNIT/ML ~~LOC~~ SOLN
10.0000 [IU] | Freq: Every day | SUBCUTANEOUS | Status: DC
  Administered 2016-06-09 – 2016-06-10 (×2): 10 [IU] via SUBCUTANEOUS
  Filled 2016-06-08 (×2): qty 0.1

## 2016-06-08 MED ORDER — VANCOMYCIN HCL IN DEXTROSE 1-5 GM/200ML-% IV SOLN
1000.0000 mg | Freq: Once | INTRAVENOUS | Status: AC
  Administered 2016-06-08: 1000 mg via INTRAVENOUS
  Filled 2016-06-08: qty 200

## 2016-06-08 MED ORDER — INSULIN ASPART 100 UNIT/ML ~~LOC~~ SOLN
0.0000 [IU] | Freq: Three times a day (TID) | SUBCUTANEOUS | Status: DC
Start: 2016-06-09 — End: 2016-06-10
  Administered 2016-06-09: 3 [IU] via SUBCUTANEOUS
  Administered 2016-06-09: 2 [IU] via SUBCUTANEOUS
  Administered 2016-06-10 (×2): 3 [IU] via SUBCUTANEOUS

## 2016-06-08 MED ORDER — DIVALPROEX SODIUM 500 MG PO DR TAB
1000.0000 mg | DELAYED_RELEASE_TABLET | Freq: Every day | ORAL | Status: DC
  Administered 2016-06-08 – 2016-06-09 (×2): 1000 mg via ORAL
  Filled 2016-06-08 (×2): qty 2

## 2016-06-08 MED ORDER — ASPIRIN EC 81 MG PO TBEC
81.0000 mg | DELAYED_RELEASE_TABLET | Freq: Every day | ORAL | Status: DC
  Administered 2016-06-08 – 2016-06-10 (×3): 81 mg via ORAL
  Filled 2016-06-08 (×3): qty 1

## 2016-06-08 MED ORDER — SODIUM CHLORIDE 0.9 % IV BOLUS (SEPSIS)
1000.0000 mL | Freq: Once | INTRAVENOUS | Status: AC
  Administered 2016-06-08: 1000 mL via INTRAVENOUS

## 2016-06-08 MED ORDER — VITAMIN D (ERGOCALCIFEROL) 1.25 MG (50000 UNIT) PO CAPS: 50000.0000 [IU] | ORAL_CAPSULE | ORAL | Status: DC

## 2016-06-08 MED ORDER — AMLODIPINE BESYLATE 10 MG PO TABS
10.0000 mg | ORAL_TABLET | Freq: Every day | ORAL | Status: DC
  Administered 2016-06-08 – 2016-06-10 (×3): 10 mg via ORAL
  Filled 2016-06-08 (×3): qty 1

## 2016-06-08 MED ORDER — CARVEDILOL 12.5 MG PO TABS
12.5000 mg | ORAL_TABLET | Freq: Two times a day (BID) | ORAL | Status: DC
  Administered 2016-06-08 – 2016-06-10 (×4): 12.5 mg via ORAL
  Filled 2016-06-08 (×4): qty 1

## 2016-06-08 MED ORDER — HEPARIN SODIUM (PORCINE) 5000 UNIT/ML IJ SOLN
5000.0000 [IU] | Freq: Three times a day (TID) | INTRAMUSCULAR | Status: DC
  Administered 2016-06-08 – 2016-06-09 (×2): 5000 [IU] via SUBCUTANEOUS
  Filled 2016-06-08 (×2): qty 1

## 2016-06-08 MED ORDER — ATORVASTATIN CALCIUM 80 MG PO TABS
80.0000 mg | ORAL_TABLET | Freq: Every day | ORAL | Status: DC
  Administered 2016-06-08 – 2016-06-10 (×3): 80 mg via ORAL
  Filled 2016-06-08 (×3): qty 1

## 2016-06-08 NOTE — ED Notes (Signed)
EKG given to Dr. Schlossman 

## 2016-06-08 NOTE — ED Notes (Signed)
Report given to 5W RN. Patient denies any questions or pain at this time.  Belongings taken to the floor with the patient.

## 2016-06-08 NOTE — Progress Notes (Signed)
Patient ID: Shane [name], male   DOB: 08/05/1970, 45 y.o.   MRN: [id number] X-ray consistent with Osteo of 3rd distal phalanx left foot. If MRI shows osteomyelitis I can proceed with surgery on Sunday late AM. Hyperglycemia , poor diabetic control. IV ABX per Hospitalist . I will see him in early AM. If medically OK for surgery he can be NPO after Midnight.  My cell [phone]

## 2016-06-08 NOTE — ED Triage Notes (Signed)
Pt c/o left foot pain onset Tuesday. Pt has had multiple surgeries to same foot. Pt reports that 2 toe nails have spontaneously falling off recently with white pus.

## 2016-06-08 NOTE — ED Provider Notes (Signed)
[facility] DEPT Provider Note   CSN: [id number] Arrival date & time: 06/08/16  1013     History   Chief Complaint Chief Complaint  Patient presents with  . Foot Pain    HPI Shane [name] is a 45 y.o. male.  HPI   45 year old male with history of insulin-dependent diabetes, EHL, bipolar, neuropathy presenting complaining of left foot pain. Patient report throbbing achy burning sensation to his left foot most significant to the third and fourth toe that has been persistent for the past 5 days. Pain is becoming increasingly more intense. He also notice that the toenails on the third toe has falling off 3 days ago and now the toenail on the fourth toe just fell off without any specific injury. No associated fever but he does report having sweats at night. No chest pain shortness of breath or back pain. He was seen by his orthopedist Dr. Sharol Given 4 days ago for this. It was concerning for potential osteomyelitis and patient was placed on doxycycline twice daily for 2 weeks. He has been compliant with his medication but symptoms persist and worsen. He has had his first and second left toe amputation within this past year due to osteomyelitis secondary to uncontrolled diabetes. Patient states his blood sugar has been well controlled as he has been compliant with his medication recently. He is concern for potential cellulitis of these affected toes.  Past Medical History:  Diagnosis Date  . Anemia   . Bipolar disorder ([place])   . Chest pain    a. 2015 Reportedly normal stress test in [place].  Marland Kitchen Chronic diastolic CHF (congestive heart failure) (HCC)    a.03/2015 Echo: EF 55-60%, Gr 1 DD, mild MR, triv PR.  . Depression   . Depression with anxiety   . Dyspnea    with exertion  . GERD (gastroesophageal reflux disease)   . Hyperlipidemia   . Hypertension    a. 08/2014 Admitted with hypertensive urgency.  . Insomnia   . Internal carotid artery stenosis   . Lower GI bleed 07/04/2015  . Neuropathy  ([place])   . Refusal of blood transfusions as patient is Jehovah's Witness   . TIA (transient ischemic attack) 08/2014; 03/2015   a. 08/2014 in setting of hypertensive urgency.  . Type II diabetes mellitus ([place])    started insulin spring 2016, Type II  . Vitamin D deficiency spring 2016    Patient Active Problem List   Diagnosis Date Noted  . Diabetic gastroparesis ([place]) 05/30/2016  . Chronic ulcer of left foot ([place]) 05/22/2016  . Dermatitis 04/26/2016  . Buzzing in ear, right 03/26/2016  . Joint pain 03/11/2016  . Weakness 03/08/2016  . Dizziness 03/03/2016  . Diabetic ulcer of right foot associated with type 2 diabetes mellitus, with necrosis of muscle ([place]) 02/21/2016  . Anemia, iron deficiency   . Chronic diastolic CHF (congestive heart failure), NYHA class 1 ([place]) 12/30/2015  . Diabetes mellitus type 2, uncontrolled, with complications ([place]) 22/07/5425  . Depression with anxiety 12/01/2015  . Pain in the chest 12/01/2015  . Insomnia 11/21/2015  . GERD (gastroesophageal reflux disease) 08/18/2015  . Amputated toe of left foot ([place]) 08/18/2015  . Abscess of third toe, left 08/01/2015  . Erectile dysfunction 07/04/2015  . Anemia of infection and chronic disease 04/24/2015  . Left arm weakness 04/02/2015  . Sensory disturbance 04/02/2015  . Essential hypertension 04/02/2015  . Hemispheric carotid artery syndrome   . HLD (hyperlipidemia)   . Headache  Past Surgical History:  Procedure Laterality Date  . AMPUTATION Left 08/03/2015   Procedure: AMPUTATION LEFT GREAT TOE;  Surgeon: Newt Minion, MD;  Location: [facility];  Service: Orthopedics;  Laterality: Left;  . AMPUTATION Left 12/02/2015   Procedure: amputation of left 2nd digit  . AMPUTATION Left 04/10/2016   Procedure: Left 2nd Toe Amputation at MTP Joint;  Surgeon: Newt Minion, MD;  Location: [facility];  Service: Orthopedics;  Laterality: Left;  . APPLICATION OF WOUND VAC Left 12/19/2015   Procedure: APPLICATION OF WOUND VAC;   Surgeon: Meredith Pel, MD;  Location: [facility];  Service: Orthopedics;  Laterality: Left;  . CIRCUMCISION    . COLONOSCOPY N/A 01/22/2016   Procedure: COLONOSCOPY;  Surgeon: Gatha Mayer, MD;  Location: [facility];  Service: Endoscopy;  Laterality: N/A;  . ESOPHAGOGASTRODUODENOSCOPY N/A 01/19/2016   Procedure: ESOPHAGOGASTRODUODENOSCOPY (EGD);  Surgeon: Manus Gunning, MD;  Location: [facility];  Service: Gastroenterology;  Laterality: N/A;  . ESOPHAGOGASTRODUODENOSCOPY N/A 01/22/2016   Procedure: ESOPHAGOGASTRODUODENOSCOPY (EGD);  Surgeon: Gatha Mayer, MD;  [facility] END[facility]COPY;  Service: Endoscopy;  Laterality: N/A;  . I&D EXTREMITY Left 12/02/2015   Procedure: IRRIGATION AND DEBRIDEMENT OF FOOT; LEFT SECOND TOE AMPUTATION;  Surgeon: Meredith Pel, MD;  Location: [facility];  Service: Orthopedics;  Laterality: Left;  . I&D EXTREMITY Left 12/19/2015   Procedure: I & D LEFT FOOT WITH BEADS ;  Surgeon: Meredith Pel, MD;  Location: [facility];  Service: Orthopedics;  Laterality: Left;  . I&D EXTREMITY Right 01/17/2016   Procedure: IRRIGATION AND DEBRIDEMENT RIGHT FOOT;  Surgeon: Newt Minion, MD;  Location: [facility];  Service: Orthopedics;  Laterality: Right;  . INCISION AND DRAINAGE FOOT Right 01/17/2016  . INGUINAL HERNIA REPAIR Bilateral ~ 1983- ~ 1986  . TONSILLECTOMY  ~ 1985       Home Medications    Prior to Admission medications   Medication Sig Start Date End Date Taking? Authorizing Provider  amLODipine (NORVASC) 10 MG tablet Take 1 tablet (10 mg total) by mouth daily. 01/23/16   Hosie Poisson, MD  aspirin EC 81 MG tablet Take 1 tablet (81 mg total) by mouth daily. 03/25/16   Josalyn Funches, MD  atorvastatin (LIPITOR) 80 MG tablet Take 1 tablet (80 mg total) by mouth every morning. 08/18/15   Josalyn Funches, MD  Blood Glucose Monitoring Suppl (TRUE METRIX METER) w/Device KIT Use as directed 05/13/16   Boykin Nearing, MD  carvedilol (COREG) 12.5 MG tablet TAKE 1 TABLET BY  MOUTH 2 TIMES DAILY WITH A MEAL 01/30/16   Josalyn Funches, MD  divalproex (DEPAKOTE) 500 MG DR tablet Take 2 tablets (1,000 mg total) by mouth at bedtime. 12/04/15   Venetia Maxon Rama, MD  ferrous sulfate 325 (65 FE) MG tablet Take 1 tablet (325 mg total) by mouth 2 (two) times daily with a meal. 08/18/15   Josalyn Funches, MD  FLUoxetine (PROZAC) 20 MG tablet Take 1 tablet (20 mg total) by mouth every morning. 03/25/16   Josalyn Funches, MD  gabapentin (NEURONTIN) 100 MG capsule Take 2 capsules (200 mg total) by mouth 3 (three) times daily. 01/23/16   Hosie Poisson, MD  glucose blood (TRUE METRIX BLOOD GLUCOSE TEST) test strip Use as instructed 12/29/15   Boykin Nearing, MD  ibuprofen (ADVIL,MOTRIN) 800 MG tablet TAKE 1 TABLET BY MOUTH EVERY 8 HOURS AS NEEDED. 05/28/16   Boykin Nearing, MD  Insulin Glargine (LANTUS SOLOSTAR) 100 UNIT/ML Solostar Pen Inject 10 Units into  the skin every morning. 05/30/16   Josalyn Funches, MD  Insulin Pen Needle (B-D ULTRAFINE III SHORT PEN) 31G X 8 MM MISC 1 application by Does not apply route daily. 03/03/16   Josalyn Funches, MD  lisinopril (PRINIVIL,ZESTRIL) 40 MG tablet TAKE 1 TABLET BY MOUTH DAILY. 05/21/16   Boykin Nearing, MD  metFORMIN (GLUCOPHAGE) 1000 MG tablet Take 1 tablet (1,000 mg total) by mouth 2 (two) times daily with a meal. 12/22/15   Ripudeep K Rai, MD  metoCLOPramide (REGLAN) 5 MG tablet Take 1 tablet (5 mg total) by mouth 3 (three) times daily before meals. 05/30/16   Josalyn Funches, MD  ondansetron (ZOFRAN ODT) 8 MG disintegrating tablet Take 1 tablet (8 mg total) by mouth every 8 (eight) hours as needed for nausea or vomiting. 02/26/16   Boykin Nearing, MD  pantoprazole (PROTONIX) 40 MG tablet Take 10 mg by mouth daily. 04/23/16   Historical Provider, MD  traZODone (DESYREL) 100 MG tablet Take 1 tablet (100 mg total) by mouth at bedtime. 02/26/16   Josalyn Funches, MD  triamcinolone ointment (KENALOG) 0.5 % Apply 1 application topically 2 (two) times  daily. 04/26/16   Boykin Nearing, MD  TRUEPLUS LANCETS 28G MISC Use as directed 12/29/15   Boykin Nearing, MD  Vitamin D, Ergocalciferol, (DRISDOL) 50000 units CAPS capsule Take 1 capsule (50,000 Units total) by mouth every 30 (thirty) days. Every 30 days 04/26/16   Boykin Nearing, MD    Family History Family History  Problem Relation Age of Onset  . Hypertension Mother   . Diabetes Mother   . Hyperlipidemia Mother   . Heart disease Mother     s/p pacemaker  . Diabetes Father   . Hypertension Father   . Stroke Father   . Heart attack Father     first MI @ 76.  . Stroke Brother     Social History Social History  Substance Use Topics  . Smoking status: Never Smoker  . Smokeless tobacco: Never Used  . Alcohol use No     Allergies   Other and Milk-related compounds   Review of Systems Review of Systems  Constitutional: Negative for fever.  Musculoskeletal: Positive for arthralgias.  Skin: Positive for wound.  All other systems reviewed and are negative.    Physical Exam Updated Vital Signs BP 163/94   Pulse 85   Temp 98.1 F (36.7 C) (Oral)   Resp 16   Ht 5' 7"  (1.702 m)   Wt 84.8 kg   SpO2 100%   BMI 29.29 kg/m   Physical Exam  Constitutional: He appears well-developed and well-nourished. No distress.  HENT:  Head: Atraumatic.  Eyes: Conjunctivae are normal.  Neck: Neck supple.  Cardiovascular: Normal rate and regular rhythm.   Pulmonary/Chest: Effort normal and breath sounds normal.  Abdominal: Soft. There is no tenderness.  Musculoskeletal: He exhibits tenderness (Left foot: Status post first and second toe amputation. Third toe with a 48m ulceration to the dorsum of the toe with scab, TTP. Missing toenail. Fourth toe is tender to palpation that is mildly edematous no obvious erythema. Missing toenail).  Neurological: He is alert.  Skin: No rash noted.  Psychiatric: He has a normal mood and affect.  Nursing note and vitals reviewed.    ED  Treatments / Results  Labs (all labs ordered are listed, but only abnormal results are displayed) Labs Reviewed  BASIC METABOLIC PANEL - Abnormal; Notable for the following:       Result Value  Potassium 5.5 (*)    Glucose, Bld 303 (*)    All other components within normal limits  CBC WITH DIFFERENTIAL/PLATELET - Abnormal; Notable for the following:    RBC 3.42 (*)    Hemoglobin 10.0 (*)    HCT 31.0 (*)    All other components within normal limits  CULTURE, BLOOD (ROUTINE X 2)  CULTURE, BLOOD (ROUTINE X 2)  LACTIC ACID, PLASMA  LACTIC ACID, PLASMA  SEDIMENTATION RATE    EKG  EKG Interpretation None       Radiology Dg Foot Complete Left  Result Date: 06/08/2016 CLINICAL DATA:  Pain and swelling of the left foot for 5 days. EXAM: LEFT FOOT - COMPLETE 3+ VIEW COMPARISON:  04/01/2016 FINDINGS: There has been a prior first and second digit amputation at the level of the metatarsophalangeal joints. There is a slight cortical irregularity and disruption of the distal most portion of the distal third phalanx, which may represent an area of osteomyelitis. Soft tissue swelling and skin disruption is also seen overlying this abnormality. Erosive changes are seen at the third proximal interphalangeal joint, which may represent either osteolytic changes or marginal erosions due to arthropathy. IMPRESSION: Osteolytic changes at the tip of the distal third phalanx which may represent focal osteomyelitis, with overlying soft tissue abnormality. Erosive changes at the third proximal interphalangeal joint, which may represent osteolytic changes due to osteomyelitis or marginal erosions due to arthropathy. Electronically Signed   By: Fidela Salisbury M.D.   On: 06/08/2016 13:35    Procedures Procedures (including critical care time)  Medications Ordered in ED Medications  sodium polystyrene (KAYEXALATE) 15 GM/60ML suspension 15 g (not administered)  sodium chloride 0.9 % bolus 1,000 mL (not  administered)  vancomycin (VANCOCIN) IVPB 1000 mg/200 mL premix (1,000 mg Intravenous New Bag/Given 06/08/16 1402)  morphine 4 MG/ML injection 4 mg (4 mg Intravenous Given 06/08/16 1401)     Initial Impression / Assessment and Plan / ED Course  I have reviewed the triage vital signs and the nursing notes.  Pertinent labs & imaging results that were available during my care of the patient were reviewed by me and considered in my medical decision making (see chart for details).  Clinical Course     BP 151/92   Pulse 85   Temp 98.1 F (36.7 C) (Oral)   Resp 16   Ht 5' 7"  (1.702 m)   Wt 84.8 kg   SpO2 94%   BMI 29.29 kg/m    Final Clinical Impressions(s) / ED Diagnoses   Final diagnoses:  Other acute osteomyelitis of left foot (HCC)  Hyperglycemia  Hyperkalemia    New Prescriptions Current Discharge Medication List     12:56 PM Patient with history of uncontrolled diabetes here with pain and  swelling to his left third and fourth toes as well as recently atraumatic loss of toenails to the affected toes. This is concerning for osteomyelitis as he has had in the past requiring amputation Dr. Sharol Given. He continues to endorse pain and swelling despite taking doxycycline at home. It appears that patient has failed outpatient management for his condition. I am concerning for osteomyelitis. Will give vancomycin IV antibiotic here, obtain x-ray of left foot, and anticipate consultation with Dr. Sharol Given for further management.   3:00 PM CBG 303, normal anion gap.  Elevated K+ 5.5 without EKG changes.  NS IVF and kayexalate given.  Xray of L foot demonstates osteolytic changes at the distal and proximal third phalanx which may represents  osteomyelitis.  Pt currently receiving vancomycin IV.  Will check lactic acid level   3:09 PM Appreciate consultation from oncall orthopedist Dr. [name] who request medical admission, as pt needs to have his CBG under control, and to obtain MRI of L foot for  further evaluation.  He will round on pt tomorrow morning.     3:30 PM Appreciate consultation from Internal medicine resident who agrees to see pt in the ER.  Pt to be admit to medical surgical floor under attending Dr. Heber [facility].     Domenic Moras, PA-C 06/09/16 [id number]    Gareth Morgan, MD 06/10/16 [phone]

## 2016-06-08 NOTE — H&P (Signed)
Date: 06/08/2016               Patient Name:  Shane [name] MRN: [id number]  DOB: Oct 30, 1970 Age / Sex: 45 y.o., male   PCP: Boykin Nearing, MD         Medical Service: Internal Medicine Teaching Service         Attending Physician: Dr. Rayne Du att. providers found    First Contact: Dr. Ophelia Shoulder Pager: [phone]  Second Contact: Dr. Berline Lopes Pager: [phone]       After Hours (After 5p/  First Contact Pager: [phone]  weekends / holidays): Second Contact Pager: [phone]   Chief Complaint: Foot pain  History of Present Illness: [name] is a 45 year old gentleman with a past medical history of insulin-dependent diabetes, bipolar, neuropathy, hypertension, hyperlipidemia and chronic HFpEF ( Last echo EF 55-60%) who presents with left foot pain. The patient reports that this pain has been throbbing, achy and a burning sensation to his left foot that is most significant on his third and fourth toe. This pain became persistent approximately 5 days ago. However, the pain has becoming increasingly more intense. He also notes that his toenails have been following off. Also of note, the patient states he was started on doxycycline to treat this infection on Wednesday of last week. The patient also states that over the past 2 weeks he has had multiple episodes of chills and has felt nauseous with 3 episodes of nonbloody nonbilious emesis. He also endorses diarrhea and constipation. He had one episode of chest pain that was brief occurring last week and denies shortness of breath. He has not taken his temperature at home but has felt febrile. He has not been around sick contacts.  In the emergency department the patient was afebrile and hemodynamically stable. Laboratory evaluation revealed a glucose of 303 and a potassium of 5.5. CBC with differential showed anemia with hemoglobin of 10.0. Lactic acid was within normal limits. Diagnostic imaging of the patient's left foot showed erosive changes  at the third proximal interphalangeal joint which may represent osteolytic changes due to osteomyelitis. Ortho surgery was consulted and reviewed the imaging which showed third distal phalanx potential osteomyelitis. An MRI was then ordered. The patient was then admitted to the [facility] internal medicine teaching service for third distal phalanx osteomyelitis of the left foot.  Meds:  Current Meds  Medication Sig  . amLODipine (NORVASC) 10 MG tablet Take 1 tablet (10 mg total) by mouth daily.  Marland Kitchen aspirin EC 81 MG tablet Take 1 tablet (81 mg total) by mouth daily.  Marland Kitchen atorvastatin (LIPITOR) 80 MG tablet Take 1 tablet (80 mg total) by mouth every morning. (Patient taking differently: Take 80 mg by mouth daily. )  . carvedilol (COREG) 12.5 MG tablet TAKE 1 TABLET BY MOUTH 2 TIMES DAILY WITH A MEAL  . divalproex (DEPAKOTE) 500 MG DR tablet Take 2 tablets (1,000 mg total) by mouth at bedtime.  Marland Kitchen doxycycline (VIBRA-TABS) 100 MG tablet Take 100 mg by mouth 2 (two) times daily.  Marland Kitchen FLUoxetine (PROZAC) 20 MG tablet Take 1 tablet (20 mg total) by mouth every morning. (Patient taking differently: Take 20 mg by mouth daily. )  . gabapentin (NEURONTIN) 100 MG capsule Take 2 capsules (200 mg total) by mouth 3 (three) times daily.  Marland Kitchen ibuprofen (ADVIL,MOTRIN) 800 MG tablet TAKE 1 TABLET BY MOUTH EVERY 8 HOURS AS NEEDED. (Patient taking differently: TAKE 1 TABLET BY MOUTH EVERY 8 HOURS AS NEEDED  FOR PAIN)  . Insulin Glargine (LANTUS SOLOSTAR) 100 UNIT/ML Solostar Pen Inject 10 Units into the skin every morning. (Patient taking differently: Inject 10 Units into the skin daily before breakfast. )  . lisinopril (PRINIVIL,ZESTRIL) 40 MG tablet TAKE 1 TABLET BY MOUTH DAILY.  . metFORMIN (GLUCOPHAGE) 1000 MG tablet Take 1 tablet (1,000 mg total) by mouth 2 (two) times daily with a meal.  . metoCLOPramide (REGLAN) 5 MG tablet Take 1 tablet (5 mg total) by mouth 3 (three) times daily before meals.  . ondansetron (ZOFRAN  ODT) 8 MG disintegrating tablet Take 1 tablet (8 mg total) by mouth every 8 (eight) hours as needed for nausea or vomiting.  . pantoprazole (PROTONIX) 40 MG tablet Take 40 mg by mouth daily.   . traZODone (DESYREL) 100 MG tablet Take 1 tablet (100 mg total) by mouth at bedtime.  . triamcinolone ointment (KENALOG) 0.5 % Apply 1 application topically 2 (two) times daily. (Patient taking differently: Apply 1 application topically at bedtime as needed (rash). )  . Vitamin D, Ergocalciferol, (DRISDOL) 50000 units CAPS capsule Take 1 capsule (50,000 Units total) by mouth every 30 (thirty) days. Every 30 days (Patient taking differently: Take 50,000 Units by mouth every 30 (thirty) days. On or about the 1st day of each month)     Allergies: Allergies as of 06/08/2016 - Review Complete 06/08/2016  Allergen Reaction Noted  . Lactose intolerance (gi) Diarrhea and Other (See Comments) 06/08/2016  . Other Other (See Comments) 07/15/2015  . Milk-related compounds Diarrhea and Other (See Comments) 02/19/2015   Past Medical History:  Diagnosis Date  . Anemia   . Bipolar disorder ([place])   . Chest pain    a. 2015 Reportedly normal stress test in [place].  Marland Kitchen Chronic diastolic CHF (congestive heart failure) (HCC)    a.03/2015 Echo: EF 55-60%, Gr 1 DD, mild MR, triv PR.  . Depression   . Depression with anxiety   . Dyspnea    with exertion  . GERD (gastroesophageal reflux disease)   . Hyperlipidemia   . Hypertension    a. 08/2014 Admitted with hypertensive urgency.  . Insomnia   . Internal carotid artery stenosis   . Lower GI bleed 07/04/2015  . Neuropathy ([place])   . Refusal of blood transfusions as patient is Jehovah's Witness   . TIA (transient ischemic attack) 08/2014; 03/2015   a. 08/2014 in setting of hypertensive urgency.  . Type II diabetes mellitus ([employer])    started insulin spring 2016, Type II  . Vitamin D deficiency spring 2016    Family History:  Family History  Problem Relation Age of Onset    . Hypertension Mother   . Diabetes Mother   . Hyperlipidemia Mother   . Heart disease Mother     s/p pacemaker  . Diabetes Father   . Hypertension Father   . Stroke Father   . Heart attack Father     first MI @ 42.  . Stroke Brother      Social History: Denies tobacco, alcohol or illicit drug use  Review of Systems: A complete ROS was negative except as per HPI.   Physical Exam: Blood pressure 162/99, pulse 78, temperature 98.1 F (36.7 C), temperature source Oral, resp. rate 16, height 5\' 7"  (1.702 m), weight 187 lb (84.8 kg), SpO2 97 %. Physical Exam  Constitutional: He appears well-developed and well-nourished.  Resting comfortably in bed in no acute distress  HENT:  Head: Normocephalic and atraumatic.  Cardiovascular: Normal  rate and regular rhythm.   Murmur heard. Grade 1/6 systolic murmur  Respiratory: Effort normal and breath sounds normal. No respiratory distress. He has no wheezes.  GI: Soft. Bowel sounds are normal. He exhibits no distension. There is tenderness.  Mild tenderness to palpation in the left lower quadrant  Musculoskeletal:  Patient's left great toe and second toe missing. Patient's third and fourth toe without nail. Foot is somewhat cold to touch.     EKG: No acute ST segment elevation  CXR: Not obtained  Assessment & Plan by Problem: Active Problems:   * No active hospital problems. *   Mr. Squillante is a 45 year old gentleman with a past medical history of hypertension, hyperlipidemia and uncontrolled diabetes who presents with left foot pain with imaging concerning for a third distal phalanx osteomyelitis of the left foot.  # Osteomyelitis Patient with poorly controlled diabetes and prior diabetic foot wound status post left great toe and second toe amputation. Presents with worsening foot pain and imaging concerning for potential osteomyelitis.No leukocytosis. No fever. I am not concerned for bacteremia at this point. However, the emergency  department ordered blood cultures and we'll follow these up. -- MRI left foot -- Follow-up orthopedic surgery recommendations -- Given 1 dose of vancomycin in the emergency department -- Hydromorphone 1 mg IV every 3 hours when necessary pain -- Follow-up blood cultures  #I nsulin-dependent diabetes mellitus -- Insulin glargine 10 units nightly before bed -- Sliding scale insulin -- Gabapentin 200 mg 3 times a day   # HFpEF ## Hypertension ## Hyperlipidemia -- Lisinopril 40 mg once daily -- Carvedilol 12.5 mg twice a day -- Atorvastatin 80 mg once daily -- Amlodipine 10 mg once daily -- Hold aspirin in case patient needs surgery  # Bipolar disorder -- Depakote 1000 mg once daily -- Fluoxetine 20 mg once daily  DVT/PE prophylaxis: Lovenox FEN/GI: Nothing by mouth at midnight Code: Full code  Dispo: Admit patient to Inpatient with expected length of stay greater than 2 midnights.  Signed: Ophelia Shoulder, MD 06/08/2016, 4:54 PM  Pager: [phone]

## 2016-06-09 ENCOUNTER — Encounter (HOSPITAL_COMMUNITY)
Admission: EM | Disposition: A | Discharge status: Home / Self Care | Payer: Self-pay | Source: Home / Self Care | Attending: Internal Medicine

## 2016-06-09 ENCOUNTER — Inpatient Hospital Stay (HOSPITAL_COMMUNITY): Payer: Medicaid Other | Admitting: Certified Registered"

## 2016-06-09 ENCOUNTER — Inpatient Hospital Stay (HOSPITAL_COMMUNITY): Payer: Medicaid Other

## 2016-06-09 DIAGNOSIS — M869 Osteomyelitis, unspecified: Secondary | ICD-10-CM

## 2016-06-09 HISTORY — PX: AMPUTATION: SHX166

## 2016-06-09 LAB — GLUCOSE, CAPILLARY
GLUCOSE-CAPILLARY: 162 mg/dL — AB (ref 65–99)
Glucose-Capillary: 154 mg/dL — ABNORMAL HIGH (ref 65–99)
Glucose-Capillary: 124 mg/dL — ABNORMAL HIGH (ref 65–99)
Glucose-Capillary: 123 mg/dL — ABNORMAL HIGH (ref 65–99)
Glucose-Capillary: 158 mg/dL — ABNORMAL HIGH (ref 65–99)

## 2016-06-09 LAB — HEMOGLOBIN A1C
HEMOGLOBIN A1C: 8.7 % — AB (ref 4.8–5.6)
MEAN PLASMA GLUCOSE: 203 mg/dL

## 2016-06-09 LAB — CREATININE, SERUM
Creatinine, Ser: 1 mg/dL (ref 0.61–1.24)
GFR calc Af Amer: >60 mL/min (ref >60)
GFR calc non Af Amer: >60 mL/min (ref >60)

## 2016-06-09 LAB — CBC
HEMATOCRIT: 31.3 % — AB (ref 39.0–52.0)
Hemoglobin: 10.1 g/dL — ABNORMAL LOW (ref 13.0–17.0)
MCH: 29.4 pg (ref 26.0–34.0)
MCHC: 32.3 g/dL (ref 30.0–36.0)
MCV: 91.3 fL (ref 78.0–100.0)
Platelets: 216 10*3/uL (ref 150–400)
RBC: 3.43 MIL/uL — ABNORMAL LOW (ref 4.22–5.81)
RDW: 14.7 % (ref 11.5–15.5)
WBC: 7.3 10*3/uL (ref 4.0–10.5)

## 2016-06-09 LAB — POCT I-STAT 4, (NA,K, GLUC, HGB,HCT)
GLUCOSE: 138 mg/dL — AB (ref 65–99)
HCT: 34 % — ABNORMAL LOW (ref 39.0–52.0)
Hemoglobin: 11.6 g/dL — ABNORMAL LOW (ref 13.0–17.0)
POTASSIUM: 4.6 mmol/L (ref 3.5–5.1)
Sodium: 141 mmol/L (ref 135–145)

## 2016-06-09 SURGERY — AMPUTATION, FOOT, RAY
Anesthesia: General | Site: Foot | Laterality: Left

## 2016-06-09 MED ORDER — ONDANSETRON HCL 4 MG/2ML IJ SOLN
INTRAMUSCULAR | Status: AC
  Filled 2016-06-09: qty 2

## 2016-06-09 MED ORDER — SODIUM CHLORIDE 0.9 % IV SOLN
INTRAVENOUS | Status: DC | PRN
  Administered 2016-06-09: 13:00:00 via INTRAVENOUS

## 2016-06-09 MED ORDER — 0.9 % SODIUM CHLORIDE (POUR BTL) OPTIME
TOPICAL | Status: DC | PRN
  Administered 2016-06-09: 1000 mL

## 2016-06-09 MED ORDER — FENTANYL CITRATE (PF) 100 MCG/2ML IJ SOLN
INTRAMUSCULAR | Status: DC | PRN
  Administered 2016-06-09: 25 ug via INTRAVENOUS
  Administered 2016-06-09: 100 ug via INTRAVENOUS
  Administered 2016-06-09 (×3): 25 ug via INTRAVENOUS

## 2016-06-09 MED ORDER — METOCLOPRAMIDE HCL 5 MG/ML IJ SOLN: 5.0000 mg | Freq: Three times a day (TID) | INTRAMUSCULAR | Status: DC | PRN

## 2016-06-09 MED ORDER — ACETAMINOPHEN 650 MG RE SUPP: 650.0000 mg | Freq: Four times a day (QID) | RECTAL | Status: DC | PRN

## 2016-06-09 MED ORDER — LIDOCAINE 2% (20 MG/ML) 5 ML SYRINGE
INTRAMUSCULAR | Status: AC
  Filled 2016-06-09: qty 5

## 2016-06-09 MED ORDER — LACTATED RINGERS IV SOLN
INTRAVENOUS | Status: DC
  Administered 2016-06-09: 13:00:00 via INTRAVENOUS

## 2016-06-09 MED ORDER — ONDANSETRON HCL 4 MG PO TABS: 4.0000 mg | ORAL_TABLET | Freq: Four times a day (QID) | ORAL | Status: DC | PRN

## 2016-06-09 MED ORDER — ENOXAPARIN SODIUM 40 MG/0.4ML ~~LOC~~ SOLN
40.0000 mg | SUBCUTANEOUS | Status: DC
  Filled 2016-06-09: qty 0.4

## 2016-06-09 MED ORDER — MIDAZOLAM HCL 2 MG/2ML IJ SOLN
INTRAMUSCULAR | Status: AC
  Filled 2016-06-09: qty 2

## 2016-06-09 MED ORDER — METOCLOPRAMIDE HCL 5 MG PO TABS
5.0000 mg | ORAL_TABLET | Freq: Three times a day (TID) | ORAL | Status: DC | PRN
  Filled 2016-06-09: qty 2

## 2016-06-09 MED ORDER — PROPOFOL 10 MG/ML IV BOLUS
INTRAVENOUS | Status: DC | PRN
  Administered 2016-06-09: 120 mg via INTRAVENOUS

## 2016-06-09 MED ORDER — FENTANYL CITRATE (PF) 100 MCG/2ML IJ SOLN
INTRAMUSCULAR | Status: AC
  Filled 2016-06-09: qty 2

## 2016-06-09 MED ORDER — ONDANSETRON HCL 4 MG/2ML IJ SOLN: 4.0000 mg | Freq: Four times a day (QID) | INTRAMUSCULAR | Status: DC | PRN

## 2016-06-09 MED ORDER — HYDROCODONE-ACETAMINOPHEN 5-325 MG PO TABS
1.0000 | ORAL_TABLET | ORAL | Status: DC | PRN
  Administered 2016-06-09: 1 via ORAL
  Administered 2016-06-10: 2 via ORAL
  Administered 2016-06-10: 1 via ORAL
  Filled 2016-06-09 (×2): qty 2
  Filled 2016-06-09: qty 1

## 2016-06-09 MED ORDER — EPHEDRINE SULFATE 50 MG/ML IJ SOLN
INTRAMUSCULAR | Status: DC | PRN
  Administered 2016-06-09: 15 mg via INTRAVENOUS
  Administered 2016-06-09: 10 mg via INTRAVENOUS
  Administered 2016-06-09: 15 mg via INTRAVENOUS

## 2016-06-09 MED ORDER — VANCOMYCIN HCL IN DEXTROSE 1-5 GM/200ML-% IV SOLN
INTRAVENOUS | Status: AC
  Filled 2016-06-09: qty 200

## 2016-06-09 MED ORDER — MIDAZOLAM HCL 2 MG/2ML IJ SOLN
INTRAMUSCULAR | Status: DC | PRN
  Administered 2016-06-09: 2 mg via INTRAVENOUS

## 2016-06-09 MED ORDER — VANCOMYCIN HCL 1000 MG IV SOLR
INTRAVENOUS | Status: DC | PRN
  Administered 2016-06-09: 1000 mg via INTRAVENOUS

## 2016-06-09 MED ORDER — SODIUM CHLORIDE 0.45 % IV SOLN
INTRAVENOUS | Status: DC
  Administered 2016-06-09: 16:00:00 via INTRAVENOUS

## 2016-06-09 MED ORDER — PROPOFOL 10 MG/ML IV BOLUS
INTRAVENOUS | Status: AC
  Filled 2016-06-09: qty 20

## 2016-06-09 MED ORDER — ACETAMINOPHEN 325 MG PO TABS: 650.0000 mg | ORAL_TABLET | Freq: Four times a day (QID) | ORAL | Status: DC | PRN

## 2016-06-09 MED ORDER — LIDOCAINE 2% (20 MG/ML) 5 ML SYRINGE
INTRAMUSCULAR | Status: DC | PRN
  Administered 2016-06-09: 60 mg via INTRAVENOUS

## 2016-06-09 MED ORDER — HYDROMORPHONE HCL 1 MG/ML IJ SOLN: 0.2500 mg | INTRAMUSCULAR | Status: DC | PRN

## 2016-06-09 MED ORDER — MORPHINE SULFATE (PF) 2 MG/ML IV SOLN
2.0000 mg | INTRAVENOUS | Status: DC | PRN
  Administered 2016-06-09 – 2016-06-10 (×2): 2 mg via INTRAVENOUS
  Filled 2016-06-09 (×2): qty 1

## 2016-06-09 MED ORDER — ONDANSETRON HCL 4 MG/2ML IJ SOLN
INTRAMUSCULAR | Status: DC | PRN
  Administered 2016-06-09: 4 mg via INTRAVENOUS

## 2016-06-09 SURGICAL SUPPLY — 42 items
BANDAGE ACE 6X5 VEL STRL LF (GAUZE/BANDAGES/DRESSINGS) ×3 IMPLANT
BANDAGE ESMARK 6X9 LF (GAUZE/BANDAGES/DRESSINGS) IMPLANT
BLADE AVERAGE 25MMX9MM (BLADE) ×1
BLADE AVERAGE 25X9 (BLADE) ×2 IMPLANT
BNDG COHESIVE 3X5 TAN STRL LF (GAUZE/BANDAGES/DRESSINGS) ×3 IMPLANT
BNDG COHESIVE 4X5 TAN STRL (GAUZE/BANDAGES/DRESSINGS) ×3 IMPLANT
BNDG ESMARK 6X9 LF (GAUZE/BANDAGES/DRESSINGS)
CUFF TOURNIQUET SINGLE 34IN LL (TOURNIQUET CUFF) IMPLANT
CUFF TOURNIQUET SINGLE 44IN (TOURNIQUET CUFF) IMPLANT
DRAPE U-SHAPE 47X51 STRL (DRAPES) ×6 IMPLANT
DRSG ADAPTIC 3X8 NADH LF (GAUZE/BANDAGES/DRESSINGS) ×3 IMPLANT
DURAPREP 26ML APPLICATOR (WOUND CARE) ×3 IMPLANT
ELECT REM PT RETURN 9FT ADLT (ELECTROSURGICAL) ×3
ELECTRODE REM PT RTRN 9FT ADLT (ELECTROSURGICAL) ×1 IMPLANT
GAUZE SPONGE 4X4 12PLY STRL (GAUZE/BANDAGES/DRESSINGS) ×3 IMPLANT
GLOVE BIOGEL PI IND STRL 8 (GLOVE) ×1 IMPLANT
GLOVE BIOGEL PI INDICATOR 8 (GLOVE) ×2
GLOVE ORTHO TXT STRL SZ7.5 (GLOVE) ×3 IMPLANT
GOWN STRL REUS W/ TWL LRG LVL3 (GOWN DISPOSABLE) ×2 IMPLANT
GOWN STRL REUS W/TWL 2XL LVL3 (GOWN DISPOSABLE) ×3 IMPLANT
GOWN STRL REUS W/TWL LRG LVL3 (GOWN DISPOSABLE) ×4
KIT BASIN OR (CUSTOM PROCEDURE TRAY) ×3 IMPLANT
KIT ROOM TURNOVER OR (KITS) ×3 IMPLANT
MANIFOLD NEPTUNE II (INSTRUMENTS) ×3 IMPLANT
NS IRRIG 1000ML POUR BTL (IV SOLUTION) ×3 IMPLANT
PACK ORTHO EXTREMITY (CUSTOM PROCEDURE TRAY) ×3 IMPLANT
PAD ARMBOARD 7.5X6 YLW CONV (MISCELLANEOUS) ×6 IMPLANT
PAD CAST 4YDX4 CTTN HI CHSV (CAST SUPPLIES) ×1 IMPLANT
PADDING CAST COTTON 4X4 STRL (CAST SUPPLIES) ×2
SPONGE LAP 18X18 X RAY DECT (DISPOSABLE) ×3 IMPLANT
STAPLER VISISTAT 35W (STAPLE) ×3 IMPLANT
STOCKINETTE IMPERVIOUS LG (DRAPES) IMPLANT
SUCTION FRAZIER HANDLE 10FR (MISCELLANEOUS) ×2
SUCTION TUBE FRAZIER 10FR DISP (MISCELLANEOUS) ×1 IMPLANT
SUT ETHILON 2 0 PSLX (SUTURE) ×6 IMPLANT
SUT ETHILON 3 0 PS 1 (SUTURE) ×3 IMPLANT
TOWEL OR 17X24 6PK STRL BLUE (TOWEL DISPOSABLE) ×3 IMPLANT
TOWEL OR 17X26 10 PK STRL BLUE (TOWEL DISPOSABLE) ×3 IMPLANT
TUBE CONNECTING 12'X1/4 (SUCTIONS) ×1
TUBE CONNECTING 12X1/4 (SUCTIONS) ×2 IMPLANT
UNDERPAD 30X30 (UNDERPADS AND DIAPERS) ×3 IMPLANT
WATER STERILE IRR 1000ML POUR (IV SOLUTION) ×3 IMPLANT

## 2016-06-09 NOTE — Progress Notes (Signed)
   Subjective: No acute events overnight. Still having some pain in the left foot. Denies n/v or abdominal pain.   Objective:  Vital signs in last 24 hours: Vitals:   06/08/16 1846 06/08/16 2032 06/09/16 0011 06/09/16 0623  BP: (!) 164/94 (!) 178/94 (!) 149/81 (!) 150/79  Pulse: 80 (!) 44 85 100  Resp:  19 20 19   Temp:  98.4 F (36.9 C) 98.2 F (36.8 C) 98.4 F (36.9 C)  TempSrc:   Oral Oral  SpO2:  100% 96% 95%  Weight: 190 lb 11.2 oz (86.5 kg)     Height: 5\' 7"  (1.702 m)      Physical Exam  Constitutional: He is oriented to person, place, and time. He appears well-developed and well-nourished.  HENT:  Head: Normocephalic and atraumatic.  Cardiovascular: Normal rate and regular rhythm.  Exam reveals no gallop and no friction rub.   No murmur heard. Respiratory: Effort normal and breath sounds normal. No respiratory distress. He has no wheezes.  GI: Soft. Bowel sounds are normal. He exhibits no distension. There is no tenderness.  Musculoskeletal: He exhibits no edema.  Status post left great and second toe amputation.  Neurological: He is alert and oriented to person, place, and time.     Assessment/Plan:  Active Problems:   Osteomyelitis of toe of left foot [facility])  Mr. Barresi is a 45 year old gentleman with a past medical history of hypertension, hyperlipidemia and uncontrolled diabetes who presents with left foot pain with imaging concerning for a third distal phalanx osteomyelitis of the left foot.  In summary, patient did well overnight. MRI scan consistent with distal phalanx third toe osteomyelitis. Has been seen by orthopedic surgery. Per their note, will plan for operation late this morning or early afternoon.  # Osteomyelitis Patient with poorly controlled diabetes and prior diabetic foot wound status post left great toe and second toe amputation. Presents with worsening foot pain and imaging concerning for potential osteomyelitis.MRI shows marrow edema  throughout the distal phalanx of the third toe consistent with osteomyelitis. Milder degree of marrow edema in the head of the third metatarsal also worrisome for osteomyelitis -- Per surgery's note, the patient will have surgical procedure this morning -- Hydromorphone 1 mg IV every 3 hours when necessary pain -- Oxycodone IR 5 mg every 4 hours as needed for pain -- Follow-up blood cultures- NGTD  #I nsulin-dependent diabetes mellitus -- Insulin glargine 10 units nightly before bed -- Sliding scale insulin -- Gabapentin 200 mg 3 times a day   # HFpEF ## Hypertension ## Hyperlipidemia -- Lisinopril 40 mg once daily -- Carvedilol 12.5 mg twice a day -- Atorvastatin 80 mg once daily -- Amlodipine 10 mg once daily -- Aspirin 81mg  once daily   # Bipolar disorder -- Depakote 1000 mg once daily -- Fluoxetine 20 mg once daily -- Trazodone 100 mg once daily in the evening   DVT/PE prophylaxis:  heparin FEN/GI: Nothing by mouth at midnight Code: Full code  Dispo: Anticipated discharge s/p surgery and recovery.   Ophelia Shoulder, MD 06/09/2016, 9:41 AM Pager: [phone]

## 2016-06-09 NOTE — Consult Note (Signed)
 Reason for Consult: Diabetic left third toe infection with osteomyelitis of the third toe Referring Physician: E. Hoffman M.D.  Casmere Governale is an 45 y.o. male.  HPI: 45-year-old male followed by Dr. Duda with insulin-dependent diabetes and increased pain and drainage left third toe with plain radiographs and MRI scan done yesterday showing left third toe osteomyelitis. Patient has other medical problems including history of congestive heart failure, depression, bipolar disorder, GERD, hypertension, and diabetes with peripheral arterial disease and neuropathy.  Past Medical History:  Diagnosis Date  . Anemia   . Bipolar disorder (HCC)   . Chest pain    a. 2015 Reportedly normal stress test in [place].  . Chronic diastolic CHF (congestive heart failure) (HCC)    a.03/2015 Echo: EF 55-60%, Gr 1 DD, mild MR, triv PR.  . Depression   . Depression with anxiety   . Dyspnea    with exertion  . GERD (gastroesophageal reflux disease)   . Hyperlipidemia   . Hypertension    a. 08/2014 Admitted with hypertensive urgency.  . Insomnia   . Internal carotid artery stenosis   . Lower GI bleed 07/04/2015  . Neuropathy (HCC)   . Refusal of blood transfusions as patient is Jehov[employer]'s Witness   . TIA (transient ischemic attack) 08/2014; 03/2015   a. 08/2014 in setting of hypertensive urgency.  . Type II diabetes mellitus (HCC)    started insulin spring 2016, Type II  . Vitamin D deficiency spring 2016    Past Surgical History:  Procedure Laterality Date  . AMPUTATION Left 08/03/2015   Procedure: AMPUTATION LEFT GREAT TOE;  Surgeon: Marcus Duda V, MD;  Location: [facility] OR;  Service: Orthopedics;  Laterality: Left;  . AMPUTATION Left 12/02/2015   Procedure: amputation of left 2nd digit  . AMPUTATION Left 04/10/2016   Procedure: Left 2nd Toe Amputation at MTP Joint;  Surgeon: Marcus V Duda, MD;  Location: [facility] OR;  Service: Orthopedics;  Laterality: Left;  . APPLICATION OF WOUND VAC Left 12/19/2015   Procedure:  APPLICATION OF WOUND VAC;  Surgeon: Scott Gregory Dean, MD;  Location: [facility] OR;  Service: Orthopedics;  Laterality: Left;  . CIRCUMCISION    . COLONOSCOPY N/A 01/22/2016   Procedure: COLONOSCOPY;  Surgeon: Carl E Gessner, MD;  Location: [facility];  Service: Endoscopy;  Laterality: N/A;  . ESOPHAGOGASTRODUODENOSCOPY N/A 01/19/2016   Procedure: ESOPHAGOGASTRODUODENOSCOPY (EGD);  Surgeon: Steven Paul Armbruster, MD;  Location: [facility];  Service: Gastroenterology;  Laterality: N/A;  . ESOPHAGOGASTRODUODENOSCOPY N/A 01/22/2016   Procedure: ESOPHAGOGASTRODUODENOSCOPY (EGD);  Surgeon: Carl E Gessner, MD;  Location: [facility];  Service: Endoscopy;  Laterality: N/A;  . I&D EXTREMITY Left 12/02/2015   Procedure: IRRIGATION AND DEBRIDEMENT OF FOOT; LEFT SECOND TOE AMPUTATION;  Surgeon: Scott Gregory Dean, MD;  Location: [facility];  Service: Orthopedics;  Laterality: Left;  . I&D EXTREMITY Left 12/19/2015   Procedure: I & D LEFT FOOT WITH BEADS ;  Surgeon: Scott Gregory Dean, MD;  Location: [facility];  Service: Orthopedics;  Laterality: Left;  . I&D EXTREMITY Right 01/17/2016   Procedure: IRRIGATION AND DEBRIDEMENT RIGHT FOOT;  Surgeon: Marcus V Duda, MD;  Location: [facility] OR;  Service: Orthopedics;  Laterality: Right;  . INCISION AND DRAINAGE FOOT Right 01/17/2016  . INGUINAL HERNIA REPAIR Bilateral ~ 1983- ~ 1986  . TONSILLECTOMY  ~ 1985    Family History  Problem Relation Age of Onset  . Hypertension Mother   . Diabetes Mother   . Hyperlipidemia Mother   . Heart   disease Mother     s/p pacemaker  . Diabetes Father   . Hypertension Father   . Stroke Father   . Heart attack Father     first MI @ 49.  . Stroke Brother     Social History:  reports that he has never smoked. He has never used smokeless tobacco. He reports that he does not drink alcohol or use drugs.  Allergies:  Allergies  Allergen Reactions  . Lactose Intolerance (Gi) Diarrhea and Other (See Comments)    bloating  . Other Other (See  Comments)    Red meat causes stomach pains, bloating and diarrhea  . Milk-Related Compounds Diarrhea and Other (See Comments)    Any dairy products  - diarrhea and bloating    Medications: I have reviewed the patient's current medications.  Results for orders placed or performed during the hospital encounter of 06/08/16 (from the past 48 hour(s))  Culture, blood (routine x 2)     Status: None (Preliminary result)   Collection Time: 06/08/16  1:40 PM  Result Value Ref Range   Specimen Description BLOOD RIGHT FOREARM    Special Requests BOTTLES DRAWN AEROBIC AND ANAEROBIC 5CC    Culture NO GROWTH < 24 HOURS    Report Status PENDING   Basic metabolic panel     Status: Abnormal   Collection Time: 06/08/16  1:50 PM  Result Value Ref Range   Sodium 137 135 - 145 mmol/L   Potassium 5.5 (H) 3.5 - 5.1 mmol/L   Chloride 103 101 - 111 mmol/L   CO2 29 22 - 32 mmol/L   Glucose, Bld 303 (H) 65 - 99 mg/dL   BUN 18 6 - 20 mg/dL   Creatinine, Ser 1.19 0.61 - 1.24 mg/dL   Calcium 9.3 8.9 - 10.3 mg/dL   GFR calc non Af Amer >60 >60 mL/min   GFR calc Af Amer >60 >60 mL/min    Comment: (NOTE) The eGFR has been calculated using the CKD EPI equation. This calculation has not been validated in all clinical situations. eGFR's persistently <60 mL/min signify possible Chronic Kidney Disease.    Anion gap 5 5 - 15  CBC with Differential     Status: Abnormal   Collection Time: 06/08/16  1:50 PM  Result Value Ref Range   WBC 7.8 4.0 - 10.5 K/uL   RBC 3.42 (L) 4.22 - 5.81 MIL/uL   Hemoglobin 10.0 (L) 13.0 - 17.0 g/dL   HCT 31.0 (L) 39.0 - 52.0 %   MCV 90.6 78.0 - 100.0 fL   MCH 29.2 26.0 - 34.0 pg   MCHC 32.3 30.0 - 36.0 g/dL   RDW 14.9 11.5 - 15.5 %   Platelets 235 150 - 400 K/uL   Neutrophils Relative % 69 %   Neutro Abs 5.5 1.7 - 7.7 K/uL   Lymphocytes Relative 22 %   Lymphs Abs 1.7 0.7 - 4.0 K/uL   Monocytes Relative 6 %   Monocytes Absolute 0.4 0.1 - 1.0 K/uL   Eosinophils Relative 3 %    Eosinophils Absolute 0.2 0.0 - 0.7 K/uL   Basophils Relative 0 %   Basophils Absolute 0.0 0.0 - 0.1 K/uL  Lactic acid, plasma     Status: None   Collection Time: 06/08/16  1:50 PM  Result Value Ref Range   Lactic Acid, Venous 1.6 0.5 - 1.9 mmol/L  Culture, blood (routine x 2)     Status: None (Preliminary result)   Collection Time: 06/08/16    1:50 PM  Result Value Ref Range   Specimen Description BLOOD LEFT ANTECUBITAL    Special Requests BOTTLES DRAWN AEROBIC AND ANAEROBIC 5CC    Culture NO GROWTH < 24 HOURS    Report Status PENDING   Sedimentation rate     Status: Abnormal   Collection Time: 06/08/16  3:58 PM  Result Value Ref Range   Sed Rate 27 (H) 0 - 16 mm/hr  Lactic acid, plasma     Status: None   Collection Time: 06/08/16  5:10 PM  Result Value Ref Range   Lactic Acid, Venous 0.7 0.5 - 1.9 mmol/L  Hemoglobin A1c     Status: Abnormal   Collection Time: 06/08/16  6:30 PM  Result Value Ref Range   Hgb A1c MFr Bld 8.7 (H) 4.8 - 5.6 %    Comment: (NOTE)         Pre-diabetes: 5.7 - 6.4         Diabetes: >6.4         Glycemic control for adults with diabetes: <7.0    Mean Plasma Glucose 203 mg/dL    Comment: (NOTE) Performed At: [facility] [address] , [place] [id number] [place] William F MD Ph:[phone]   Glucose, capillary     Status: Abnormal   Collection Time: 06/08/16  8:26 PM  Result Value Ref Range   Glucose-Capillary 153 (H) 65 - 99 mg/dL  Glucose, capillary     Status: Abnormal   Collection Time: 06/09/16  8:01 AM  Result Value Ref Range   Glucose-Capillary 162 (H) 65 - 99 mg/dL  Glucose, capillary     Status: Abnormal   Collection Time: 06/09/16 11:29 AM  Result Value Ref Range   Glucose-Capillary 154 (H) 65 - 99 mg/dL    Mr Foot Left Wo Contrast  Result Date: 06/09/2016 CLINICAL DATA:  Left foot pain, fall while being and burning centered about the third and fourth toes for 5 days, worsening. History of prior first and second toe  amputation for osteomyelitis. Diabetic patient. EXAM: MRI OF THE LEFT FOOT WITHOUT CONTRAST TECHNIQUE: Multiplanar, multisequence MR imaging of the left foot was performed. No intravenous contrast was administered. COMPARISON:  Plain films left foot 06/09/2015. MRI left foot 12/31/2015. FINDINGS: Bones/Joint/Cartilage Since the most recent MRI, the patient has undergone amputation of the second toe. Prior great toe amputation is noted. Mild to moderate edema is seen in the head of the second metatarsal and proximal 1.4 cm of the third toe. More intense marrow edema is also seen throughout the distal phalanx of the third toe. Bone marrow signal is otherwise unremarkable. Ligaments Negative. Muscles and Tendons Mild atrophy of the intrinsic musculature the foot is unchanged. No intramuscular fluid collection. Soft tissues Subcutaneous edema is present diffusely about the foot. No focal fluid collection is identified. Tiny amount of fluid off the head of the second metatarsal is likely postoperative. IMPRESSION: Marrow edema throughout the distal phalanx of the third toe is consistent with osteomyelitis. Milder degree of marrow edema in the head of the third metatarsal and proximal 1.5 cm of the third toe is also worrisome for osteomyelitis. Status post amputation of the first and second toes. Subcutaneous edema about the foot compatible with dependent change and/or cellulitis. No abscess is identified. Electronically Signed   By: Thomas  Dalessio M.D.   On: 06/09/2016 08:29   Dg Foot Complete Left  Result Date: 06/08/2016 CLINICAL DATA:  Pain and swelling of the left foot for 5 days.   EXAM: LEFT FOOT - COMPLETE 3+ VIEW COMPARISON:  04/01/2016 FINDINGS: There has been a prior first and second digit amputation at the level of the metatarsophalangeal joints. There is a slight cortical irregularity and disruption of the distal most portion of the distal third phalanx, which may represent an area of osteomyelitis. Soft  tissue swelling and skin disruption is also seen overlying this abnormality. Erosive changes are seen at the third proximal interphalangeal joint, which may represent either osteolytic changes or marginal erosions due to arthropathy. IMPRESSION: Osteolytic changes at the tip of the distal third phalanx which may represent focal osteomyelitis, with overlying soft tissue abnormality. Erosive changes at the third proximal interphalangeal joint, which may represent osteolytic changes due to osteomyelitis or marginal erosions due to arthropathy. Electronically Signed   By: Dobrinka  Dimitrova M.D.   On: 06/08/2016 13:35    ROS 14 part review of systems updated and is negative other than the history of present illness changes noted above. He's had previous great toe and second toe amputation on the left from osteomyelitis by Dr. Duda. He was seen 3 days ago started on doxycycline but had increased pain and drainage. I had discussed case with the Dr. Taylor who works with Dr. Hoffman an MRI was performed which shows the osteomyelitis. Blood pressure (!) 151/99, pulse 72, temperature 97.5 F (36.4 C), temperature source Oral, resp. rate 19, height 5' 7" (1.702 m), weight 190 lb 11.2 oz (86.5 kg), SpO2 98 %. Physical Exam  General patient's alert and oriented WD WN, in NAD. No chills or fever.  HEENT PERRLA EOMI no icterus  Cardiac RRand R  Pulmonary: No audible rales no rhonchi.  Abdomen soft  Extremities: No plantar flexed foot lesions right foot. Left foot has absent second and great toe from previous amputation. Ulcer at the tip of the third toe with ulceration. He does have palpable pulses. Stocking decreased sensation consistent with his neuropathy. Gastrocsoleus and anterior tib is active.  Assessment/Plan: Discussed with patient options. He's having some pain in his fourth and fifth toe but there is no evidence of osteomyelitis in the fourth and fifth toe. Third toe, left on MRI shows  osteomyelitis of the distal phalanx. He does have some increased changes on T2 signal in the metatarsal head but no definite osteomyelitis. No soft tissue abscess noted. We discussed options for treatment is artery been on antibiotics. With the osteomyelitis involving the third toe will proceed with third toe amputation. Questions were answered he understands and requests we proceed.  Shanyn Preisler C Kambra Beachem 06/09/2016, 1:09 PM      

## 2016-06-09 NOTE — Interval H&P Note (Signed)
History and Physical Interval Note:  06/09/2016 1:21 PM  Shane [name]  has presented today for surgery, with the diagnosis of /OSTEOMYLITIS LEFT THIRD TOE  The various methods of treatment have been discussed with the patient and family. After consideration of risks, benefits and other options for treatment, the patient has consented to  Procedure(s): AMPUTATION LEFT THIRD TOE (Left) as a surgical intervention .  The patient's history has been reviewed, patient examined, no change in status, stable for surgery.  I have reviewed the patient's chart and labs.  Questions were answered to the patient's satisfaction.     [name]

## 2016-06-09 NOTE — Transfer of Care (Signed)
Immediate Anesthesia Transfer of Care Note  Patient: Shane [name]  Procedure(s) Performed: Procedure(s): AMPUTATION LEFT THIRD TOE (Left)  Patient Location: PACU  Anesthesia Type:General  Level of Consciousness: sedated  Airway & Oxygen Therapy: Patient Spontanous Breathing and Patient connected to nasal cannula oxygen  Post-op Assessment: Report given to RN and Post -op Vital signs reviewed and stable  Post vital signs: Reviewed and stable  Last Vitals:  Vitals:   06/09/16 0623 06/09/16 1225  BP: (!) 150/79 (!) 151/99  Pulse: 100 72  Resp: 19   Temp: 36.9 C 36.4 C    Last Pain:  Vitals:   06/09/16 1225  TempSrc: Oral  PainSc:       Patients Stated Pain Goal: 2 ([id number])  Complications: No apparent anesthesia complications

## 2016-06-09 NOTE — Anesthesia Postprocedure Evaluation (Signed)
Anesthesia Post Note  Patient: Shane [name]  Procedure(s) Performed: Procedure(s) (LRB): AMPUTATION LEFT THIRD TOE (Left)  Patient location during evaluation: PACU Anesthesia Type: General Level of consciousness: awake and alert Pain management: pain level controlled Vital Signs Assessment: post-procedure vital signs reviewed and stable Respiratory status: spontaneous breathing, nonlabored ventilation, respiratory function stable and patient connected to nasal cannula oxygen Cardiovascular status: blood pressure returned to baseline and stable Postop Assessment: no signs of nausea or vomiting Anesthetic complications: no       Last Vitals:  Vitals:   06/09/16 1438 06/09/16 1453  BP: 107/80 122/84  Pulse: 68 67  Resp: 12 10  Temp:  36.7 C    Last Pain:  Vitals:   06/09/16 1225  TempSrc: Oral  PainSc:                  Shane [name],W. EDMOND

## 2016-06-09 NOTE — H&P (View-Only) (Signed)
Reason for Consult: Diabetic left third toe infection with osteomyelitis of the third toe Referring Physician: E. Heber Erskine M.D.  Shane [name] is an 45 y.o. male.  HPI: 45 year old male followed by Dr. Sharol Given with insulin-dependent diabetes and increased pain and drainage left third toe with plain radiographs and MRI scan done yesterday showing left third toe osteomyelitis. Patient has other medical problems including history of congestive heart failure, depression, bipolar disorder, GERD, hypertension, and diabetes with peripheral arterial disease and neuropathy.  Past Medical History:  Diagnosis Date  . Anemia   . Bipolar disorder ([place])   . Chest pain    a. 2015 Reportedly normal stress test in [place].  Marland Kitchen Chronic diastolic CHF (congestive heart failure) (HCC)    a.03/2015 Echo: EF 55-60%, Gr 1 DD, mild MR, triv PR.  . Depression   . Depression with anxiety   . Dyspnea    with exertion  . GERD (gastroesophageal reflux disease)   . Hyperlipidemia   . Hypertension    a. 08/2014 Admitted with hypertensive urgency.  . Insomnia   . Internal carotid artery stenosis   . Lower GI bleed 07/04/2015  . Neuropathy ([place])   . Refusal of blood transfusions as patient is Jehovah's Witness   . TIA (transient ischemic attack) 08/2014; 03/2015   a. 08/2014 in setting of hypertensive urgency.  . Type II diabetes mellitus ([place])    started insulin spring 2016, Type II  . Vitamin D deficiency spring 2016    Past Surgical History:  Procedure Laterality Date  . AMPUTATION Left 08/03/2015   Procedure: AMPUTATION LEFT GREAT TOE;  Surgeon: Newt Minion, MD;  Location: [place];  Service: Orthopedics;  Laterality: Left;  . AMPUTATION Left 12/02/2015   Procedure: amputation of left 2nd digit  . AMPUTATION Left 04/10/2016   Procedure: Left 2nd Toe Amputation at MTP Joint;  Surgeon: Newt Minion, MD;  Location: [place];  Service: Orthopedics;  Laterality: Left;  . APPLICATION OF WOUND VAC Left 12/19/2015   Procedure:  APPLICATION OF WOUND VAC;  Surgeon: Meredith Pel, MD;  Location: [place];  Service: Orthopedics;  Laterality: Left;  . CIRCUMCISION    . COLONOSCOPY N/A 01/22/2016   Procedure: COLONOSCOPY;  Surgeon: Gatha Mayer, MD;  Location: [place];  Service: Endoscopy;  Laterality: N/A;  . ESOPHAGOGASTRODUODENOSCOPY N/A 01/19/2016   Procedure: ESOPHAGOGASTRODUODENOSCOPY (EGD);  Surgeon: Manus Gunning, MD;  Location: [place];  Service: Gastroenterology;  Laterality: N/A;  . ESOPHAGOGASTRODUODENOSCOPY N/A 01/22/2016   Procedure: ESOPHAGOGASTRODUODENOSCOPY (EGD);  Surgeon: Gatha Mayer, MD;  Location: [facility] ENDOSCOPY;  Service: Endoscopy;  Laterality: N/A;  . I&D EXTREMITY Left 12/02/2015   Procedure: IRRIGATION AND DEBRIDEMENT OF FOOT; LEFT SECOND TOE AMPUTATION;  Surgeon: Meredith Pel, MD;  Location: [place];  Service: Orthopedics;  Laterality: Left;  . I&D EXTREMITY Left 12/19/2015   Procedure: I & D LEFT FOOT WITH BEADS ;  Surgeon: Meredith Pel, MD;  Location: [place];  Service: Orthopedics;  Laterality: Left;  . I&D EXTREMITY Right 01/17/2016   Procedure: IRRIGATION AND DEBRIDEMENT RIGHT FOOT;  Surgeon: Newt Minion, MD;  Location: [place];  Service: Orthopedics;  Laterality: Right;  . INCISION AND DRAINAGE FOOT Right 01/17/2016  . INGUINAL HERNIA REPAIR Bilateral ~ 1983- ~ 1986  . TONSILLECTOMY  ~ 4    Family History  Problem Relation Age of Onset  . Hypertension Mother   . Diabetes Mother   . Hyperlipidemia Mother   . Heart  disease Mother     s/p pacemaker  . Diabetes Father   . Hypertension Father   . Stroke Father   . Heart attack Father     first MI @ 56.  . Stroke Brother     Social History:  reports that he has never smoked. He has never used smokeless tobacco. He reports that he does not drink alcohol or use drugs.  Allergies:  Allergies  Allergen Reactions  . Lactose Intolerance (Gi) Diarrhea and Other (See Comments)    bloating  . Other Other (See  Comments)    Red meat causes stomach pains, bloating and diarrhea  . Milk-Related Compounds Diarrhea and Other (See Comments)    Any dairy products  - diarrhea and bloating    Medications: I have reviewed the patient's current medications.  Results for orders placed or performed during the hospital encounter of 06/08/16 (from the past 48 hour(s))  Culture, blood (routine x 2)     Status: None (Preliminary result)   Collection Time: 06/08/16  1:40 PM  Result Value Ref Range   Specimen Description BLOOD RIGHT FOREARM    Special Requests BOTTLES DRAWN AEROBIC AND ANAEROBIC 5CC    Culture NO GROWTH < 24 HOURS    Report Status PENDING   Basic metabolic panel     Status: Abnormal   Collection Time: 06/08/16  1:50 PM  Result Value Ref Range   Sodium 137 135 - 145 mmol/L   Potassium 5.5 (H) 3.5 - 5.1 mmol/L   Chloride 103 101 - 111 mmol/L   CO2 29 22 - 32 mmol/L   Glucose, Bld 303 (H) 65 - 99 mg/dL   BUN 18 6 - 20 mg/dL   Creatinine, Ser 1.19 0.61 - 1.24 mg/dL   Calcium 9.3 8.9 - 10.3 mg/dL   GFR calc non Af Amer >60 >60 mL/min   GFR calc Af Amer >60 >60 mL/min    Comment: (NOTE) The eGFR has been calculated using the CKD EPI equation. This calculation has not been validated in all clinical situations. eGFR's persistently <60 mL/min signify possible Chronic Kidney Disease.    Anion gap 5 5 - 15  CBC with Differential     Status: Abnormal   Collection Time: 06/08/16  1:50 PM  Result Value Ref Range   WBC 7.8 4.0 - 10.5 K/uL   RBC 3.42 (L) 4.22 - 5.81 MIL/uL   Hemoglobin 10.0 (L) 13.0 - 17.0 g/dL   HCT 31.0 (L) 39.0 - 52.0 %   MCV 90.6 78.0 - 100.0 fL   MCH 29.2 26.0 - 34.0 pg   MCHC 32.3 30.0 - 36.0 g/dL   RDW 14.9 11.5 - 15.5 %   Platelets 235 150 - 400 K/uL   Neutrophils Relative % 69 %   Neutro Abs 5.5 1.7 - 7.7 K/uL   Lymphocytes Relative 22 %   Lymphs Abs 1.7 0.7 - 4.0 K/uL   Monocytes Relative 6 %   Monocytes Absolute 0.4 0.1 - 1.0 K/uL   Eosinophils Relative 3 %    Eosinophils Absolute 0.2 0.0 - 0.7 K/uL   Basophils Relative 0 %   Basophils Absolute 0.0 0.0 - 0.1 K/uL  Lactic acid, plasma     Status: None   Collection Time: 06/08/16  1:50 PM  Result Value Ref Range   Lactic Acid, Venous 1.6 0.5 - 1.9 mmol/L  Culture, blood (routine x 2)     Status: None (Preliminary result)   Collection Time: 06/08/16  1:50 PM  Result Value Ref Range   Specimen Description BLOOD LEFT ANTECUBITAL    Special Requests BOTTLES DRAWN AEROBIC AND ANAEROBIC 5CC    Culture NO GROWTH < 24 HOURS    Report Status PENDING   Sedimentation rate     Status: Abnormal   Collection Time: 06/08/16  3:58 PM  Result Value Ref Range   Sed Rate 27 (H) 0 - 16 mm/hr  Lactic acid, plasma     Status: None   Collection Time: 06/08/16  5:10 PM  Result Value Ref Range   Lactic Acid, Venous 0.7 0.5 - 1.9 mmol/L  Hemoglobin A1c     Status: Abnormal   Collection Time: 06/08/16  6:30 PM  Result Value Ref Range   Hgb A1c MFr Bld 8.7 (H) 4.8 - 5.6 %    Comment: (NOTE)         Pre-diabetes: 5.7 - 6.4         Diabetes: >6.4         Glycemic control for adults with diabetes: <7.0    Mean Plasma Glucose 203 mg/dL    Comment: (NOTE) Performed At: [facility] [address] [place], [place] [id number] Lindon Romp MD MW:[id number]   Glucose, capillary     Status: Abnormal   Collection Time: 06/08/16  8:26 PM  Result Value Ref Range   Glucose-Capillary 153 (H) 65 - 99 mg/dL  Glucose, capillary     Status: Abnormal   Collection Time: 06/09/16  8:01 AM  Result Value Ref Range   Glucose-Capillary 162 (H) 65 - 99 mg/dL  Glucose, capillary     Status: Abnormal   Collection Time: 06/09/16 11:29 AM  Result Value Ref Range   Glucose-Capillary 154 (H) 65 - 99 mg/dL    Mr Foot Left Wo Contrast  Result Date: 06/09/2016 CLINICAL DATA:  Left foot pain, fall while being and burning centered about the third and fourth toes for 5 days, worsening. History of prior first and second toe  amputation for osteomyelitis. Diabetic patient. EXAM: MRI OF THE LEFT FOOT WITHOUT CONTRAST TECHNIQUE: Multiplanar, multisequence MR imaging of the left foot was performed. No intravenous contrast was administered. COMPARISON:  Plain films left foot 06/09/2015. MRI left foot 12/31/2015. FINDINGS: Bones/Joint/Cartilage Since the most recent MRI, the patient has undergone amputation of the second toe. Prior great toe amputation is noted. Mild to moderate edema is seen in the head of the second metatarsal and proximal 1.4 cm of the third toe. More intense marrow edema is also seen throughout the distal phalanx of the third toe. Bone marrow signal is otherwise unremarkable. Ligaments Negative. Muscles and Tendons Mild atrophy of the intrinsic musculature the foot is unchanged. No intramuscular fluid collection. Soft tissues Subcutaneous edema is present diffusely about the foot. No focal fluid collection is identified. Tiny amount of fluid off the head of the second metatarsal is likely postoperative. IMPRESSION: Marrow edema throughout the distal phalanx of the third toe is consistent with osteomyelitis. Milder degree of marrow edema in the head of the third metatarsal and proximal 1.5 cm of the third toe is also worrisome for osteomyelitis. Status post amputation of the first and second toes. Subcutaneous edema about the foot compatible with dependent change and/or cellulitis. No abscess is identified. Electronically Signed   By: Inge Rise M.D.   On: 06/09/2016 08:29   Dg Foot Complete Left  Result Date: 06/08/2016 CLINICAL DATA:  Pain and swelling of the left foot for 5 days.  EXAM: LEFT FOOT - COMPLETE 3+ VIEW COMPARISON:  04/01/2016 FINDINGS: There has been a prior first and second digit amputation at the level of the metatarsophalangeal joints. There is a slight cortical irregularity and disruption of the distal most portion of the distal third phalanx, which may represent an area of osteomyelitis. Soft  tissue swelling and skin disruption is also seen overlying this abnormality. Erosive changes are seen at the third proximal interphalangeal joint, which may represent either osteolytic changes or marginal erosions due to arthropathy. IMPRESSION: Osteolytic changes at the tip of the distal third phalanx which may represent focal osteomyelitis, with overlying soft tissue abnormality. Erosive changes at the third proximal interphalangeal joint, which may represent osteolytic changes due to osteomyelitis or marginal erosions due to arthropathy. Electronically Signed   By: Fidela Salisbury M.D.   On: 06/08/2016 13:35    ROS 14 part review of systems updated and is negative other than the history of present illness changes noted above. He's had previous great toe and second toe amputation on the left from osteomyelitis by Dr. Sharol Given. He was seen 3 days ago started on doxycycline but had increased pain and drainage. I had discussed case with the Dr. Lovena Le who works with Dr. Heber Converse an MRI was performed which shows the osteomyelitis. Blood pressure (!) 151/99, pulse 72, temperature 97.5 F (36.4 C), temperature source Oral, resp. rate 19, height _0  (1.702 m), weight 190 lb 11.2 oz (86.5 kg), SpO2 98 %. Physical Exam  General patient's alert and oriented WD WN, in NAD. No chills or fever.  HEENT PERRLA EOMI no icterus  Cardiac RRand R  Pulmonary: No audible rales no rhonchi.  Abdomen soft  Extremities: No plantar flexed foot lesions right foot. Left foot has absent second and great toe from previous amputation. Ulcer at the tip of the third toe with ulceration. He does have palpable pulses. Stocking decreased sensation consistent with his neuropathy. Gastrocsoleus and anterior tib is active.  Assessment/Plan: Discussed with patient options. He's having some pain in his fourth and fifth toe but there is no evidence of osteomyelitis in the fourth and fifth toe. Third toe, left on MRI shows  osteomyelitis of the distal phalanx. He does have some increased changes on T2 signal in the metatarsal head but no definite osteomyelitis. No soft tissue abscess noted. We discussed options for treatment is artery been on antibiotics. With the osteomyelitis involving the third toe will proceed with third toe amputation. Questions were answered he understands and requests we proceed.  [name] 06/09/2016, 1:09 PM

## 2016-06-09 NOTE — Discharge Summary (Signed)
Name: Shane [name] MRN: [id number] DOB: [birth year] 45 y.o. PCP: Shane Nearing, MD  Date of Admission: 06/08/2016 11:11 AM Date of Discharge: 06/10/2016 Attending Physician: Shane Groves, DO  Discharge Diagnosis: 1. Left third toe osteomyelitis 2. Chronic medical conditions Active Problems:   Essential hypertension   Diabetes mellitus type 2, uncontrolled, with complications (HCC)   Osteomyelitis of toe of left foot [facility])   Discharge Medications: Allergies as of 06/10/2016      Reactions   Lactose Intolerance (gi) Diarrhea, Other (See Comments)   bloating   Other Other (See Comments)   Red meat causes stomach pains, bloating and diarrhea   Milk-related Compounds Diarrhea, Other (See Comments)   Any dairy products  - diarrhea and bloating      Medication List    TAKE these medications   amLODipine 10 MG tablet Commonly known as:  NORVASC Take 1 tablet (10 mg total) by mouth daily.   aspirin EC 81 MG tablet Take 1 tablet (81 mg total) by mouth daily.   atorvastatin 80 MG tablet Commonly known as:  LIPITOR Take 1 tablet (80 mg total) by mouth every morning. What changed:  when to take this   carvedilol 12.5 MG tablet Commonly known as:  COREG TAKE 1 TABLET BY MOUTH 2 TIMES DAILY WITH A MEAL   divalproex 500 MG DR tablet Commonly known as:  DEPAKOTE Take 2 tablets (1,000 mg total) by mouth at bedtime.   doxycycline 100 MG tablet Commonly known as:  VIBRA-TABS Take 1 tablet (100 mg total) by mouth every 12 (twelve) hours. What changed:  when to take this   ferrous sulfate 325 (65 FE) MG tablet Take 1 tablet (325 mg total) by mouth 2 (two) times daily with a meal.   FLUoxetine 20 MG tablet Commonly known as:  PROZAC Take 1 tablet (20 mg total) by mouth every morning. What changed:  when to take this   gabapentin 100 MG capsule Commonly known as:  NEURONTIN Take 2 capsules (200 mg total) by mouth 3 (three) times daily.   glucose blood test  strip Commonly known as:  TRUE METRIX BLOOD GLUCOSE TEST Use as instructed   HYDROcodone-acetaminophen 5-325 MG tablet Commonly known as:  NORCO/VICODIN Take 1 tablet by mouth every 6 (six) hours as needed for moderate pain (breakthrough pain).   ibuprofen 800 MG tablet Commonly known as:  ADVIL,MOTRIN TAKE 1 TABLET BY MOUTH EVERY 8 HOURS AS NEEDED. What changed:  See the new instructions.   Insulin Glargine 100 UNIT/ML Solostar Pen Commonly known as:  LANTUS SOLOSTAR Inject 10 Units into the skin every morning. What changed:  when to take this   Insulin Pen Needle 31G X 8 MM Misc Commonly known as:  B-D ULTRAFINE III SHORT PEN 1 application by Does not apply route daily.   lisinopril 40 MG tablet Commonly known as:  PRINIVIL,ZESTRIL TAKE 1 TABLET BY MOUTH DAILY.   metFORMIN 1000 MG tablet Commonly known as:  GLUCOPHAGE Take 1 tablet (1,000 mg total) by mouth 2 (two) times daily with a meal.   metoCLOPramide 5 MG tablet Commonly known as:  REGLAN Take 1 tablet (5 mg total) by mouth 3 (three) times daily before meals.   ondansetron 8 MG disintegrating tablet Commonly known as:  ZOFRAN ODT Take 1 tablet (8 mg total) by mouth every 8 (eight) hours as needed for nausea or vomiting.   pantoprazole 40 MG tablet Commonly known as:  PROTONIX Take 40 mg by mouth daily.  traZODone 100 MG tablet Commonly known as:  DESYREL Take 1 tablet (100 mg total) by mouth at bedtime.   triamcinolone ointment 0.5 % Commonly known as:  KENALOG Apply 1 application topically 2 (two) times daily. What changed:  when to take this  reasons to take this   TRUE METRIX METER w/Device Kit Use as directed   TRUEPLUS LANCETS 28G Misc Use as directed   Vitamin D (Ergocalciferol) 50000 units Caps capsule Commonly known as:  DRISDOL Take 1 capsule (50,000 Units total) by mouth every 30 (thirty) days. Every 30 days What changed:  additional instructions       Disposition and follow-up:    Mr.Shane [name] was discharged from [facility] in Good condition.  At the hospital follow up visit please address:  1.  Ensure he has finished is antibiotics.   2.  Labs / imaging needed at time of follow-up: None  3.  Pending labs/ test needing follow-up: None  Follow-up Appointments: Follow-up Information    Shane Killings, MD Follow up in 1 week(s).   Specialty:  Orthopedic Surgery Contact information: [place] [address] [phone]         1. Will follow-up with his primary care physician at [place] by problem list: Active Problems:   Essential hypertension   Diabetes mellitus type 2, uncontrolled, with complications ([place])   Osteomyelitis of toe of left foot ([place])   1. Left third toe osteomyelitis Status post amputation The patient presented to the [facility] emergency department on 06/08/2016 with a chief complaint of left foot pain. He has a medical history of diabetes with prior foot infection requiring left great toe and second toe amputation. He has had pain in this foot for a while but it acutely worsened over the last week. Additionally, the patient stated that he has felt extremely tired and had several episodes of nausea and vomiting over the prior week. In the emergency department the patient was afebrile and hemodynamically stable. X-ray of the left foot was concerning for potential osteomyelitis. MRI of the left foot showed increased edema in the third phalanx of the left foot consistent with osteomyelitis. The patient was seen by orthopedic surgery and underwent third toe amputation. Following the surgery he recovered appropriately. He'll be discharged on his home medication regimen with follow-up in the [facility] health and wellness clinic for further diabetic control. Additionally, he'll be discharged on a seven-day course of oral doxycycline for the treatment of osteomyelitis of the foot in a  diabetic patient. His antibiotic stop date will be 06/16/2016. On the day of discharge the patient was afebrile, hemodynamically stable and his pain was well-controlled.  2. Chronic medical conditions The patient has several chronic medical conditions including insulin-dependent diabetes mellitus, hypertension, hyperlipidemia, heart failure with preserved ejection fraction and bipolar disorder. While in the inpatient setting all the patient's home medications were continued for the treatment of these conditions. We made no additional adjustments to medications treating his chronic medical conditions.  Discharge Vitals:   BP 101/61 (BP Location: Left Arm)   Pulse 87   Temp 98.3 F (36.8 C) (Oral)   Resp 18   Ht 5' 7"  (1.702 m)   Wt 190 lb 11.2 oz (86.5 kg)   SpO2 92%   BMI 29.87 kg/m   Pertinent Labs, Studies, and Procedures:  1. Diagnostic left foot radiograph-osteolytic changes at the tip of the distal third phalanx which may represent  focal osteomyelitis 2. Diagnostic MRI left foot without contrast - marrow edema throughout the distal failings of the third toes consistent with osteomyelitis. Milder degree of marrow edema in the head of the third metatarsal and proximal 1.5 cm with her toes also worrisome for osteomyelitis. 3. Status post left third toe amputation.  Discharge Instructions: Discharge Instructions    Diet - low sodium heart healthy    Complete by:  As directed    Discharge instructions    Complete by:  As directed    We have given you an antibiotic for your foot infection. Please take 1 pill twice daily for 7 days.  For pain control I have prescribed Norco. You can take 1 pill every 6 hours as needed for pain. Also, you make take ibuprofen 600 mg up to three times a day for pain when it is more severe.  Please follow-up with your PCP and the surgeon as scheduled.   Increase activity slowly    Complete by:  As directed       Signed: Ophelia Shoulder, MD 06/10/2016,  9:22 AM   Pager: [phone]

## 2016-06-09 NOTE — Brief Op Note (Signed)
06/08/2016 - 06/09/2016  2:08 PM  PATIENT:  Antony Madura  45 y.o. male  PRE-OPERATIVE DIAGNOSIS:  /OSTEOMYLITIS LEFT THIRD TOE  POST-OPERATIVE DIAGNOSIS:  /OSTEOMYLITIS LEFT THIRD TOE  PROCEDURE:  Procedure(s): AMPUTATION LEFT THIRD TOE (Left)  SURGEON:  Surgeon(s) and Role:    * [name], MD - Primary  PHYSICIAN ASSISTANT:   ASSISTANTS: none   ANESTHESIA:   general  EBL:  Total I/O In: 0  Out: 403 [Urine:400; Blood:3]  BLOOD ADMINISTERED:none  DRAINS: none   LOCAL MEDICATIONS USED:  NONE  SPECIMEN:  No Specimen  DISPOSITION OF SPECIMEN:  N/A  COUNTS:  YES  TOURNIQUET:    DICTATION: .Dragon Dictation  PLAN OF CARE: alreadyIP  PATIENT DISPOSITION:  PACU - hemodynamically stable.   Delay start of Pharmacological VTE agent (>24hrs) due to surgical blood loss or risk of bleeding: not applicable

## 2016-06-09 NOTE — Anesthesia Procedure Notes (Signed)
Procedure Name: LMA Insertion Date/Time: 06/09/2016 1:33 PM Performed by: Marinda Elk A Pre-anesthesia Checklist: Patient identified, Emergency Drugs available, Suction available, Patient being monitored and Timeout performed Patient Re-evaluated:Patient Re-evaluated prior to inductionOxygen Delivery Method: Circle System Utilized and Circle system utilized Preoxygenation: Pre-oxygenation with 100% oxygen Intubation Type: IV induction LMA: LMA inserted LMA Size: 4.0 Number of attempts: 1 Placement Confirmation: positive ETCO2 Tube secured with: Tape Dental Injury: Teeth and Oropharynx as per pre-operative assessment

## 2016-06-09 NOTE — Anesthesia Preprocedure Evaluation (Addendum)
Anesthesia Evaluation  Patient identified by MRN, date of birth, ID band Patient awake    Reviewed: Allergy & Precautions, H&P , NPO status , Patient's Chart, lab work & pertinent test results, reviewed documented beta blocker date and time   Airway Mallampati: II  TM Distance: >3 FB Neck ROM: Full    Dental no notable dental hx. (+) Teeth Intact, Dental Advisory Given   Pulmonary neg pulmonary ROS,    Pulmonary exam normal breath sounds clear to auscultation       Cardiovascular hypertension, Pt. on medications and Pt. on home beta blockers + Peripheral Vascular Disease and +CHF   Rhythm:Regular Rate:Normal     Neuro/Psych  Headaches, Depression Bipolar Disorder TIA   GI/Hepatic Neg liver ROS, GERD  Medicated and Controlled,  Endo/Other  diabetes, Insulin Dependent, Oral Hypoglycemic Agents  Renal/GU   negative genitourinary   Musculoskeletal   Abdominal   Peds  Hematology negative hematology ROS (+) anemia ,   Anesthesia Other Findings   Reproductive/Obstetrics negative OB ROS                            Anesthesia Physical Anesthesia Plan  ASA: III  Anesthesia Plan: General   Post-op Pain Management:    Induction: Intravenous  Airway Management Planned: LMA  Additional Equipment:   Intra-op Plan:   Post-operative Plan: Extubation in OR  Informed Consent: I have reviewed the patients History and Physical, chart, labs and discussed the procedure including the risks, benefits and alternatives for the proposed anesthesia with the patient or authorized representative who has indicated his/her understanding and acceptance.   Dental advisory given  Plan Discussed with: CRNA  Anesthesia Plan Comments:         Anesthesia Quick Evaluation

## 2016-06-09 NOTE — Progress Notes (Signed)
Patient ID: Shane [name], male   DOB: 07/01/1970, 45 y.o.   MRN: [id number] From Orthopedic standpoint would cover until xmas day with IV ABX then OK for discharge home with PO ABX times one week. ( resume doxy OK )  ROV one week  Dr. [name]

## 2016-06-10 ENCOUNTER — Encounter (HOSPITAL_COMMUNITY): Payer: Self-pay | Admitting: Orthopaedic Surgery

## 2016-06-10 LAB — GLUCOSE, CAPILLARY
Glucose-Capillary: 183 mg/dL — ABNORMAL HIGH (ref 65–99)
Glucose-Capillary: 199 mg/dL — ABNORMAL HIGH (ref 65–99)

## 2016-06-10 MED ORDER — DOXYCYCLINE HYCLATE 100 MG PO TABS: 100.0000 mg | ORAL_TABLET | Freq: Two times a day (BID) | ORAL | 0 refills | Status: AC

## 2016-06-10 MED ORDER — DOXYCYCLINE HYCLATE 100 MG PO TABS
100.0000 mg | ORAL_TABLET | Freq: Two times a day (BID) | ORAL | Status: DC
  Administered 2016-06-10: 100 mg via ORAL
  Filled 2016-06-10: qty 1

## 2016-06-10 MED ORDER — HYDROCODONE-ACETAMINOPHEN 5-325 MG PO TABS: 1.0000 | ORAL_TABLET | Freq: Four times a day (QID) | ORAL | 0 refills | Status: AC | PRN

## 2016-06-10 NOTE — Progress Notes (Signed)
   Subjective: No acute events overnight. S/p Left third toe amputation for osteomyelitis.  Doing well and pain well controlled. No n/v or abdominal pain. Ready to go home this morning.   Objective:  Vital signs in last 24 hours: Vitals:   06/09/16 1643 06/09/16 1818 06/09/16 2035 06/10/16 0516  BP: 126/86 135/89 116/80 101/61  Pulse: 70 74 94 87  Resp: 20 20 18    Temp: 97.3 F (36.3 C) 97.5 F (36.4 C) 97.6 F (36.4 C) 98.3 F (36.8 C)  TempSrc: Oral Oral Oral Oral  SpO2: 94% 98% 95% 92%  Weight:      Height:       Physical Exam  Constitutional: He is oriented to person, place, and time. He appears well-developed and well-nourished.  HENT:  Head: Normocephalic and atraumatic.  Cardiovascular: Normal rate and regular rhythm.  Exam reveals no gallop and no friction rub.   No murmur heard. Respiratory: Effort normal and breath sounds normal. No respiratory distress. He has no wheezes.  GI: Soft. Bowel sounds are normal. He exhibits no distension. There is no tenderness.  Musculoskeletal: He exhibits no edema.  Status post left great, second and third toe amputation. Left foot wrapped.  Neurological: He is alert and oriented to person, place, and time.     Assessment/Plan:  Active Problems:   Essential hypertension   Diabetes mellitus type 2, uncontrolled, with complications (HCC)   Osteomyelitis of toe of left foot [facility])  Shane [name] is a 45 year old gentleman with a past medical history of hypertension, hyperlipidemia and uncontrolled diabetes who presents with left foot pain with imaging concerning for a third distal phalanx osteomyelitis of the left foot.  In summary, patient is status post left third toe amputation for osteomyelitis. He is postop day 1 and afebrile as well as hemodynamically stable. He will be discharged today with a 7 day course of oral doxycycline and follow-up with orthopedic surgery in approximately 1 week for ongoing care.  # Osteomyelitis Status  post left third toe amputation for osteomyelitis. Postop day #1 afebrile and hemodynamically stable. Will be discharged with a 7 day course of oral doxycycline and follow-up with orthopedic surgery in one week. -- Hydromorphone 1 mg IV every 3 hours when necessary pain -- Norco 1-2 tablets every 4 hours as needed for pain -- Morphine 2 mg IV every 2 hours as needed for pain -- Follow-up blood cultures- NGTD -- Doxycycline 100 mg twice a day 7 days, antibiotic stop date will be 06/16/2016  # Insulin-dependent diabetes mellitus -- Insulin glargine 10 units nightly before bed -- Sliding scale insulin -- Gabapentin 200 mg 3 times a day   # HFpEF ## Hypertension ## Hyperlipidemia -- Lisinopril 40 mg once daily -- Carvedilol 12.5 mg twice a day -- Atorvastatin 80 mg once daily -- Amlodipine 10 mg once daily -- Aspirin 81mg  once daily   # Bipolar disorder -- Depakote 1000 mg once daily -- Fluoxetine 20 mg once daily -- Trazodone 100 mg once daily in the evening   DVT/PE prophylaxis:  Lovenox FEN/GI: Nothing by mouth at midnight Code: Full code  Dispo: Anticipated discharge this afternoon.  Shane Shoulder, MD 06/10/2016, 7:13 AM Pager: [phone]

## 2016-06-10 NOTE — Progress Notes (Signed)
Pt given discharge instructions, prescriptions, and care notes. Pt verbalized understanding AEB no further questions or concerns at this time. IV was discontinued, no redness, pain, or swelling noted at this time. Pt left the floor via wheelchair with staff in stable condition. 

## 2016-06-10 NOTE — Progress Notes (Signed)
Orthopedic Tech Progress Note Patient Details:  Shane [name] 1970/08/24 [id number]  Ortho Devices Type of Ortho Device: Postop shoe/boot Ortho Device/Splint Interventions: Application   Shane [name] 06/10/2016, 9:13 AM

## 2016-06-10 NOTE — Progress Notes (Signed)
Orthopedic Tech Progress Note Patient Details:  Shane Stiteler Jun 02, 1971 [id number]  Ortho Devices Type of Ortho Device: Postop shoe/boot Ortho Device/Splint Location: Replacement post op shoe Ortho Device/Splint Interventions: Application   [place] Pink 06/10/2016, 6:27 PM

## 2016-06-11 NOTE — Op Note (Signed)
Preop diagnosis: Left third toe osteomyelitis.  Postop diagnosis: Same  Procedure: left third toe amputation through metatarsal phalangeal joint.  Surgeon: Rodell Perna M.D.  Anesthesia: Gen.  Brief history this 45 year old male followed by Dr. Sharol Given who started had great toe second toe amputations from diabetic ulcerations with bone involvement. He had increased pain third toe ulcer with the tip x-ray and MRI scan confirmed osteomyelitis of the distal phalanx. Patient had an ulcer at the tip of the third toe.  Procedure: After induction anesthesia prepping with DuraPrep Esmarch tourniquet timeout procedure patient was artery on double antibiotics and no re-dose was necessary. Skin outlined with skin marker and incision was made using some plantar and some dorsal skin. Ampicillin performed through the midshaft the proximal phalanx and falling back the remaining portion of the proximal phalanx was removed with visualization of the third metatarsal head which did not have any evidence of infection at the metatarsal phalangeal joint. Copious irrigation. Esmarch was removed and hemostasis obtained with the bipolar there was adequate skin bleeding and skin was reapproximated with a small nylon interrupted sutures. Dressing was applied and patient was transferred recovery room

## 2016-06-12 NOTE — Telephone Encounter (Signed)
Good morning Alinda Sierras do you have any information on Mr. Patella's referral to ENT. Please follow up with me and let me know

## 2016-06-12 NOTE — Telephone Encounter (Signed)
Good Morning  I don't have an ent  Referral

## 2016-06-12 NOTE — Telephone Encounter (Signed)
Pt is calling to request information about a referral to ENT. Please F/U

## 2016-06-13 ENCOUNTER — Encounter (HOSPITAL_COMMUNITY): Payer: Self-pay | Admitting: *Deleted

## 2016-06-13 ENCOUNTER — Emergency Department (HOSPITAL_COMMUNITY): Payer: Medicaid Other

## 2016-06-13 ENCOUNTER — Emergency Department (HOSPITAL_COMMUNITY)
Admission: EM | Admit: 2016-06-13 | Discharge: 2016-06-13 | Disposition: A | Discharge status: Home / Self Care | Payer: Medicaid Other | Attending: Emergency Medicine | Admitting: Emergency Medicine

## 2016-06-13 DIAGNOSIS — Z7982 Long term (current) use of aspirin: Secondary | ICD-10-CM | POA: Insufficient documentation

## 2016-06-13 DIAGNOSIS — M79672 Pain in left foot: Secondary | ICD-10-CM | POA: Diagnosis present

## 2016-06-13 DIAGNOSIS — I5032 Chronic diastolic (congestive) heart failure: Secondary | ICD-10-CM | POA: Diagnosis not present

## 2016-06-13 DIAGNOSIS — G8918 Other acute postprocedural pain: Secondary | ICD-10-CM | POA: Diagnosis not present

## 2016-06-13 DIAGNOSIS — R079 Chest pain, unspecified: Secondary | ICD-10-CM | POA: Insufficient documentation

## 2016-06-13 DIAGNOSIS — I110 Hypertensive heart disease with heart failure: Secondary | ICD-10-CM | POA: Diagnosis not present

## 2016-06-13 DIAGNOSIS — Z79899 Other long term (current) drug therapy: Secondary | ICD-10-CM | POA: Insufficient documentation

## 2016-06-13 DIAGNOSIS — R0789 Other chest pain: Secondary | ICD-10-CM

## 2016-06-13 DIAGNOSIS — E1140 Type 2 diabetes mellitus with diabetic neuropathy, unspecified: Secondary | ICD-10-CM | POA: Insufficient documentation

## 2016-06-13 DIAGNOSIS — Z8673 Personal history of transient ischemic attack (TIA), and cerebral infarction without residual deficits: Secondary | ICD-10-CM | POA: Diagnosis not present

## 2016-06-13 DIAGNOSIS — Z794 Long term (current) use of insulin: Secondary | ICD-10-CM | POA: Insufficient documentation

## 2016-06-13 LAB — COMPREHENSIVE METABOLIC PANEL
ALK PHOS: 49 U/L (ref 38–126)
ALT: 23 U/L (ref 17–63)
ANION GAP: 8 (ref 5–15)
AST: 20 U/L (ref 15–41)
Albumin: 3.2 g/dL — ABNORMAL LOW (ref 3.5–5.0)
BILIRUBIN TOTAL: 0.2 mg/dL — AB (ref 0.3–1.2)
BUN: 12 mg/dL (ref 6–20)
CALCIUM: 9 mg/dL (ref 8.9–10.3)
CO2: 26 mmol/L (ref 22–32)
CREATININE: 0.93 mg/dL (ref 0.61–1.24)
Chloride: 104 mmol/L (ref 101–111)
Glucose, Bld: 193 mg/dL — ABNORMAL HIGH (ref 65–99)
Potassium: 4.5 mmol/L (ref 3.5–5.1)
Sodium: 138 mmol/L (ref 135–145)
TOTAL PROTEIN: 7.1 g/dL (ref 6.5–8.1)

## 2016-06-13 LAB — CBC WITH DIFFERENTIAL/PLATELET
BASOS PCT: 0 %
Basophils Absolute: 0 10*3/uL (ref 0.0–0.1)
EOS ABS: 0.2 10*3/uL (ref 0.0–0.7)
EOS PCT: 2 %
HCT: 31.7 % — ABNORMAL LOW (ref 39.0–52.0)
Hemoglobin: 10.3 g/dL — ABNORMAL LOW (ref 13.0–17.0)
LYMPHS ABS: 1.3 10*3/uL (ref 0.7–4.0)
Lymphocytes Relative: 16 %
MCH: 30 pg (ref 26.0–34.0)
MCHC: 32.5 g/dL (ref 30.0–36.0)
MCV: 92.4 fL (ref 78.0–100.0)
Monocytes Absolute: 0.4 10*3/uL (ref 0.1–1.0)
Monocytes Relative: 4 %
Neutro Abs: 6.3 10*3/uL (ref 1.7–7.7)
Neutrophils Relative %: 78 %
PLATELETS: 240 10*3/uL (ref 150–400)
RBC: 3.43 MIL/uL — AB (ref 4.22–5.81)
RDW: 15 % (ref 11.5–15.5)
WBC: 8.2 10*3/uL (ref 4.0–10.5)

## 2016-06-13 LAB — URINALYSIS, ROUTINE W REFLEX MICROSCOPIC
BILIRUBIN URINE: NEGATIVE
Glucose, UA: NEGATIVE mg/dL
KETONES UR: NEGATIVE mg/dL
Leukocytes, UA: NEGATIVE
Nitrite: NEGATIVE
Protein, ur: 100 mg/dL — AB
SPECIFIC GRAVITY, URINE: 1.016 (ref 1.005–1.030)
SQUAMOUS EPITHELIAL / LPF: NONE SEEN
pH: 6 (ref 5.0–8.0)

## 2016-06-13 LAB — CULTURE, BLOOD (ROUTINE X 2)
CULTURE: NO GROWTH
Culture: NO GROWTH

## 2016-06-13 LAB — I-STAT CG4 LACTIC ACID, ED
LACTIC ACID, VENOUS: 1.97 mmol/L — AB (ref 0.5–1.9)
LACTIC ACID, VENOUS: 1.36 mmol/L (ref 0.5–1.9)

## 2016-06-13 LAB — I-STAT TROPONIN, ED
TROPONIN I, POC: 0 ng/mL (ref 0.00–0.08)
TROPONIN I, POC: 0 ng/mL (ref 0.00–0.08)

## 2016-06-13 LAB — D-DIMER, QUANTITATIVE (NOT AT ARMC): D DIMER QUANT: 0.86 ug{FEU}/mL — AB (ref 0.00–0.50)

## 2016-06-13 MED ORDER — IOPAMIDOL (ISOVUE-370) INJECTION 76%
INTRAVENOUS | Status: AC
  Administered 2016-06-13: 100 mL
  Filled 2016-06-13: qty 100

## 2016-06-13 MED ORDER — OXYCODONE-ACETAMINOPHEN 5-325 MG PO TABS
1.0000 | ORAL_TABLET | Freq: Once | ORAL | Status: AC
  Administered 2016-06-13: 1 via ORAL
  Filled 2016-06-13: qty 1

## 2016-06-13 MED ORDER — SODIUM CHLORIDE 0.9 % IV BOLUS (SEPSIS)
1000.0000 mL | Freq: Once | INTRAVENOUS | Status: AC
  Administered 2016-06-13: 1000 mL via INTRAVENOUS

## 2016-06-13 NOTE — Telephone Encounter (Signed)
ENT referral placed for moderate hearing loss on the right side and a moderately-severe hearing loss on the left side. The hearing loss is sensorineural bilaterally.    Patient had audiology assessment on 06/04/2016

## 2016-06-13 NOTE — Telephone Encounter (Signed)
Sent Referral to [facility] ph. # [phone] address [address] .They will  contact the patient to schedule an appointment ° °

## 2016-06-13 NOTE — ED Notes (Signed)
Patient transported to CT 

## 2016-06-13 NOTE — ED Triage Notes (Addendum)
Pt reports recent amputation of toes on left foot. Pt states that he has continued to have pain on the remaining toes on the foot. Pt states that since the surgery he has had nausea vomiting and fevers. Pt reports reddness, swelling and increased drainage from site

## 2016-06-13 NOTE — ED Provider Notes (Signed)
[facility] DEPT Provider Note   CSN: [id number] Arrival date & time: 06/13/16 1132     History    Chief Complaint  Patient presents with  . Foot Pain     HPI Shane [name] is a 45 y.o. male.   45yo M w/ T2DM, dCHF, TIA, Recent left toe amputation who presents with left foot pain and nausea, vomiting, and fevers. The patient was admitted recently and discharged on 06/10/16 after hospitalization for left third toe amputation due to osteomyelitis. He was discharged with doxycycline which he has been taking as directed. He states that he initially felt okay when he went home but yesterday he began having nausea, vomiting, and intermittent subjective fevers/sweats. He has continued to have pain of his left fourth and fifth toes and has noticed increased drainage and redness at the amputation site of his left third toe. He also incidentally reports intermittent, central, sharp chest pain that occurs at rest and is associated with shortness of breath. It lasts a few minutes at a time and seems to be worse when he vomits. It also occurs when he is not vomiting. He endorses a lot of burning reflux and notes his symptoms are worse in the morning. He reports taking nitroglycerin with some relief, last dose was last night. He denies any personal history of heart disease but mother with heart problems in her 74s. He denies any personal history of blood clots. His blood sugars have been running slightly higher than usual, in the 280s yesterday.  Pt also notes that he has been walking a lot since discharge.    Past Medical History:  Diagnosis Date  . Anemia   . Bipolar disorder ([place])   . Chest pain    a. 2015 Reportedly normal stress test in [place].  Marland Kitchen Chronic diastolic CHF (congestive heart failure) (HCC)    a.03/2015 Echo: EF 55-60%, Gr 1 DD, mild MR, triv PR.  . Depression   . Depression with anxiety   . Dyspnea    with exertion  . GERD (gastroesophageal reflux disease)   .  Hyperlipidemia   . Hypertension    a. 08/2014 Admitted with hypertensive urgency.  . Insomnia   . Internal carotid artery stenosis   . Lower GI bleed 07/04/2015  . Neuropathy ([place])   . Refusal of blood transfusions as patient is Jehovah's Witness   . TIA (transient ischemic attack) 08/2014; 03/2015   a. 08/2014 in setting of hypertensive urgency.  . Type II diabetes mellitus ([place])    started insulin spring 2016, Type II  . Vitamin D deficiency spring 2016     Patient Active Problem List   Diagnosis Date Noted  . Osteomyelitis of toe of left foot ([place]) 06/08/2016  . Diabetic gastroparesis ([place]) 05/30/2016  . Chronic ulcer of left foot ([place]) 05/22/2016  . Dermatitis 04/26/2016  . Buzzing in ear, right 03/26/2016  . Joint pain 03/11/2016  . Weakness 03/08/2016  . Dizziness 03/03/2016  . Diabetic ulcer of right foot associated with type 2 diabetes mellitus, with necrosis of muscle ([place]) 02/21/2016  . Anemia, iron deficiency   . Chronic diastolic CHF (congestive heart failure), NYHA class 1 ([place]) 12/30/2015  . Diabetes mellitus type 2, uncontrolled, with complications ([place]) [id number]  . Depression with anxiety 12/01/2015  . Pain in the chest 12/01/2015  . Insomnia 11/21/2015  . GERD (gastroesophageal reflux disease) 08/18/2015  . Amputated toe of left foot ([place]) 08/18/2015  . Abscess of third toe, left 08/01/2015  . Erectile  dysfunction 07/04/2015  . Anemia of infection and chronic disease 04/24/2015  . Left arm weakness 04/02/2015  . Sensory disturbance 04/02/2015  . Essential hypertension 04/02/2015  . Hemispheric carotid artery syndrome   . HLD (hyperlipidemia)   . Headache     Past Surgical History:  Procedure Laterality Date  . AMPUTATION Left 08/03/2015   Procedure: AMPUTATION LEFT GREAT TOE;  Surgeon: Newt Minion, MD;  Location: [place];  Service: Orthopedics;  Laterality: Left;  . AMPUTATION Left 12/02/2015   Procedure: amputation of left 2nd digit  . AMPUTATION Left  04/10/2016   Procedure: Left 2nd Toe Amputation at MTP Joint;  Surgeon: Newt Minion, MD;  Location: [place];  Service: Orthopedics;  Laterality: Left;  . AMPUTATION Left 06/09/2016   Procedure: AMPUTATION LEFT THIRD TOE;  Surgeon: [name], MD;  Location: [place];  Service: Orthopedics;  Laterality: Left;  . APPLICATION OF WOUND VAC Left 12/19/2015   Procedure: APPLICATION OF WOUND VAC;  Surgeon: Meredith Pel, MD;  Location: [place];  Service: Orthopedics;  Laterality: Left;  . CIRCUMCISION    . COLONOSCOPY N/A 01/22/2016   Procedure: COLONOSCOPY;  Surgeon: Gatha Mayer, MD;  Location: [place];  Service: Endoscopy;  Laterality: N/A;  . ESOPHAGOGASTRODUODENOSCOPY N/A 01/19/2016   Procedure: ESOPHAGOGASTRODUODENOSCOPY (EGD);  Surgeon: Manus Gunning, MD;  Location: [facility];  Service: Gastroenterology;  Laterality: N/A;  . ESOPHAGOGASTRODUODENOSCOPY N/A 01/22/2016   Procedure: ESOPHAGOGASTRODUODENOSCOPY (EGD);  Surgeon: Gatha Mayer, MD;  Location: [facility] ENDOSCOPY;  Service: Endoscopy;  Laterality: N/A;  . I&D EXTREMITY Left 12/02/2015   Procedure: IRRIGATION AND DEBRIDEMENT OF FOOT; LEFT SECOND TOE AMPUTATION;  Surgeon: Meredith Pel, MD;  Location: [place];  Service: Orthopedics;  Laterality: Left;  . I&D EXTREMITY Left 12/19/2015   Procedure: I & D LEFT FOOT WITH BEADS ;  Surgeon: Meredith Pel, MD;  Location: [place];  Service: Orthopedics;  Laterality: Left;  . I&D EXTREMITY Right 01/17/2016   Procedure: IRRIGATION AND DEBRIDEMENT RIGHT FOOT;  Surgeon: Newt Minion, MD;  Location: [place];  Service: Orthopedics;  Laterality: Right;  . INCISION AND DRAINAGE FOOT Right 01/17/2016  . INGUINAL HERNIA REPAIR Bilateral ~ 1983- ~ 1986  . TONSILLECTOMY  ~ 1985        Home Medications    Prior to Admission medications   Medication Sig Start Date End Date Taking? Authorizing Provider  amLODipine (NORVASC) 10 MG tablet Take 1 tablet (10 mg total) by mouth daily. 01/23/16  Yes  Hosie Poisson, MD  aspirin EC 81 MG tablet Take 1 tablet (81 mg total) by mouth daily. 03/25/16  Yes Josalyn Funches, MD  atorvastatin (LIPITOR) 80 MG tablet Take 1 tablet (80 mg total) by mouth every morning. Patient taking differently: Take 80 mg by mouth daily.  08/18/15  Yes Josalyn Funches, MD  carvedilol (COREG) 12.5 MG tablet TAKE 1 TABLET BY MOUTH 2 TIMES DAILY WITH A MEAL 01/30/16  Yes Josalyn Funches, MD  divalproex (DEPAKOTE) 500 MG DR tablet Take 2 tablets (1,000 mg total) by mouth at bedtime. 12/04/15  Yes Venetia Maxon Rama, MD  doxycycline (VIBRA-TABS) 100 MG tablet Take 1 tablet (100 mg total) by mouth every 12 (twelve) hours. 06/10/16 06/17/16 Yes Ophelia Shoulder, MD  FLUoxetine (PROZAC) 20 MG tablet Take 1 tablet (20 mg total) by mouth every morning. 03/25/16  Yes Josalyn Funches, MD  gabapentin (NEURONTIN) 100 MG capsule Take 2 capsules (200 mg total) by mouth 3 (three) times  daily. 01/23/16  Yes Hosie Poisson, MD  HYDROcodone-acetaminophen (NORCO/VICODIN) 5-325 MG tablet Take 1 tablet by mouth every 6 (six) hours as needed for moderate pain (breakthrough pain). 06/10/16 06/17/16 Yes Ophelia Shoulder, MD  ibuprofen (ADVIL,MOTRIN) 800 MG tablet TAKE 1 TABLET BY MOUTH EVERY 8 HOURS AS NEEDED. Patient taking differently: TAKE 1 TABLET BY MOUTH EVERY 8 HOURS AS NEEDED FOR PAIN 05/28/16  Yes Josalyn Funches, MD  Insulin Glargine (LANTUS SOLOSTAR) 100 UNIT/ML Solostar Pen Inject 10 Units into the skin every morning. Patient taking differently: Inject 10 Units into the skin daily before breakfast.  05/30/16  Yes Josalyn Funches, MD  lisinopril (PRINIVIL,ZESTRIL) 40 MG tablet TAKE 1 TABLET BY MOUTH DAILY. 05/21/16  Yes Boykin Nearing, MD  metFORMIN (GLUCOPHAGE) 1000 MG tablet Take 1 tablet (1,000 mg total) by mouth 2 (two) times daily with a meal. 12/22/15  Yes Ripudeep K Rai, MD  metoCLOPramide (REGLAN) 5 MG tablet Take 1 tablet (5 mg total) by mouth 3 (three) times daily before meals. 05/30/16  Yes Josalyn  Funches, MD  ondansetron (ZOFRAN ODT) 8 MG disintegrating tablet Take 1 tablet (8 mg total) by mouth every 8 (eight) hours as needed for nausea or vomiting. 02/26/16  Yes Josalyn Funches, MD  pantoprazole (PROTONIX) 40 MG tablet Take 40 mg by mouth daily.  04/23/16  Yes Historical Provider, MD  traZODone (DESYREL) 100 MG tablet Take 1 tablet (100 mg total) by mouth at bedtime. 02/26/16  Yes Josalyn Funches, MD  triamcinolone ointment (KENALOG) 0.5 % Apply 1 application topically 2 (two) times daily. Patient taking differently: Apply 1 application topically at bedtime as needed (rash).  04/26/16  Yes Josalyn Funches, MD  Vitamin D, Ergocalciferol, (DRISDOL) 50000 units CAPS capsule Take 1 capsule (50,000 Units total) by mouth every 30 (thirty) days. Every 30 days Patient taking differently: Take 50,000 Units by mouth every 30 (thirty) days. On or about the 1st day of each month 04/26/16  Yes Boykin Nearing, MD      Family History  Problem Relation Age of Onset  . Hypertension Mother   . Diabetes Mother   . Hyperlipidemia Mother   . Heart disease Mother     s/p pacemaker  . Diabetes Father   . Hypertension Father   . Stroke Father   . Heart attack Father     first MI @ 37.  . Stroke Brother      Social History  Substance Use Topics  . Smoking status: Never Smoker  . Smokeless tobacco: Never Used  . Alcohol use No     Allergies     Lactose intolerance (gi); Other; and Milk-related compounds    Review of Systems  10 Systems reviewed and are negative for acute change except as noted in the HPI.   Physical Exam Updated Vital Signs BP (!) 178/113   Pulse 95   Temp 98 F (36.7 C) (Oral)   Resp 19   Ht 5\' 7"  (1.702 m)   Wt 192 lb (87.1 kg)   SpO2 98%   BMI 30.07 kg/m   Physical Exam  Constitutional: He is oriented to person, place, and time. He appears well-developed and well-nourished. No distress.  HENT:  Head: Normocephalic and atraumatic.  Moist mucous  membranes  Eyes: Conjunctivae are normal. Pupils are equal, round, and reactive to light.  Neck: Neck supple.  Cardiovascular: Normal rate, regular rhythm, normal heart sounds and intact distal pulses.   No murmur heard. Pulmonary/Chest: Effort normal and breath sounds normal.  Abdominal: Soft. Bowel sounds are normal. He exhibits no distension. There is no tenderness.  Musculoskeletal: He exhibits no edema.  Sutures in place over L 3rd toe amputation stump w/ dried blood, mild swelling but no active drainage  Neurological: He is alert and oriented to person, place, and time.  Fluent speech  Skin: Skin is warm and dry.  See photo  Psychiatric: He has a normal mood and affect. Judgment normal.  Nursing note and vitals reviewed.       ED Treatments / Results  Labs (all labs ordered are listed, but only abnormal results are displayed) Labs Reviewed  COMPREHENSIVE METABOLIC PANEL - Abnormal; Notable for the following:       Result Value   Glucose, Bld 193 (*)    Albumin 3.2 (*)    Total Bilirubin 0.2 (*)    All other components within normal limits  CBC WITH DIFFERENTIAL/PLATELET - Abnormal; Notable for the following:    RBC 3.43 (*)    Hemoglobin 10.3 (*)    HCT 31.7 (*)    All other components within normal limits  D-DIMER, QUANTITATIVE (NOT AT [facility]) - Abnormal; Notable for the following:    D-Dimer, Quant 0.86 (*)    All other components within normal limits  URINALYSIS, ROUTINE W REFLEX MICROSCOPIC - Abnormal; Notable for the following:    Hgb urine dipstick SMALL (*)    Protein, ur 100 (*)    Bacteria, UA RARE (*)    All other components within normal limits  I-STAT CG4 LACTIC ACID, ED - Abnormal; Notable for the following:    Lactic Acid, Venous 1.97 (*)    All other components within normal limits  CULTURE, BLOOD (ROUTINE X 2)  CULTURE, BLOOD (ROUTINE X 2)  URINE CULTURE  I-STAT CG4 LACTIC ACID, ED  I-STAT TROPOININ, ED  I-STAT TROPOININ, ED     EKG  EKG  Interpretation  Date/Time:    Ventricular Rate:    PR Interval:    QRS Duration:   QT Interval:    QTC Calculation:   R Axis:     Text Interpretation:           Radiology Dg Chest 2 View  Result Date: 06/13/2016 CLINICAL DATA:  Intermittent chest pain. EXAM: CHEST  2 VIEW COMPARISON:  02/29/2016 FINDINGS: Normal heart size and mediastinal contours. No acute infiltrate or edema. No effusion or pneumothorax. No acute osseous findings. IMPRESSION: Negative chest. Electronically Signed   By: Monte Fantasia M.D.   On: 06/13/2016 16:25   Ct Angio Chest Pe W/cm &/or Wo Cm  Result Date: 06/13/2016 CLINICAL DATA:  45 year old male with intermittent chest pain and shortness of breath. Elevated D-dimer. Recent toe amputation surgery. EXAM: CT ANGIOGRAPHY CHEST WITH CONTRAST TECHNIQUE: Multidetector CT imaging of the chest was performed using the standard protocol during bolus administration of intravenous contrast. Multiplanar CT image reconstructions and MIPs were obtained to evaluate the vascular anatomy. CONTRAST:  100 cc Isovue 370 IV. COMPARISON:  Chest radiograph from earlier today. 01/19/2016 chest CT angiogram. FINDINGS: Cardiovascular: The study is high quality for the evaluation of pulmonary embolism. There are no filling defects in the central, lobar, segmental or subsegmental pulmonary artery branches to suggest acute pulmonary embolism. Great vessels are normal in course and caliber. Normal heart size. No significant pericardial fluid/thickening. Left anterior descending, left circumflex and right coronary atherosclerosis . Mediastinum/Nodes: No discrete thyroid nodules. Small fluid level in the mid thoracic esophagus. No pathologically enlarged axillary, mediastinal or hilar  lymph nodes. Lungs/Pleura: No pneumothorax. No pleural effusion. Mild hypoventilatory changes in the dependent lungs. No acute consolidative airspace disease, lung masses or significant pulmonary nodules. Upper  abdomen: Unremarkable. Musculoskeletal:  No aggressive appearing focal osseous lesions. Review of the MIP images confirms the above findings. IMPRESSION: 1. No pulmonary embolism.  No active pulmonary disease. 2. Three-vessel coronary atherosclerosis. 3. Small fluid level in the mid thoracic esophagus, which may indicate esophageal dysmotility and/or gastroesophageal reflux disease. Electronically Signed   By: Ilona Sorrel M.D.   On: 06/13/2016 18:36    Procedures Procedures (including critical care time) Procedures  Medications Ordered in ED  Medications  sodium chloride 0.9 % bolus 1,000 mL (0 mLs Intravenous Stopped 06/13/16 2212)  oxyCODONE-acetaminophen (PERCOCET/ROXICET) 5-325 MG per tablet 1 tablet (1 tablet Oral Given 06/13/16 1746)  iopamidol (ISOVUE-370) 76 % injection (100 mLs  Contrast Given 06/13/16 1759)     Initial Impression / Assessment and Plan / ED Course  I have reviewed the triage vital signs and the nursing notes.  Pertinent labs & imaging results that were available during my care of the patient were reviewed by me and considered in my medical decision making (see chart for details).  Clinical Course    PT w/ T2DM, recent toe amputation 2/2 osteomyelitis who p/w ongoing L foot pain and N/V, subjective fevers. Incidentally notes occasional CP that is worse after vomiting. He was comfortable on exam, afebrile, vital signs notable for hypertension. He had sutures in place over his amputation stump on left foot with some dried blood but no active drainage, no cellulitic changes. Mild swelling of foot. Obtained above lab work which showed initial lactate of 1.97, repeat was 1.36 prior to finishing fluid bolus, normal serial troponins. Normal WBC count. Obtained d-dimer because of the patient's recent hospitalization; was elevated therefore obtained CTA of chest which was negative for PE. CTA does note esophageal changes suggestive of dysmotility or reflux. On further  questioning, the patient does endorse significant reflux symptoms and states that his chest pain is worse with vomiting therefore I suspect noncardiac etiology of his pain. Regarding his foot, I discussed with orthopedic surgeon on call, Dr. Sharol Given, who recommended continuing doxycycline and following up with Dr. [name] in clinic. He felt worsening infection was unlikely given the appearance of wound and normal WBC count. On reexamination, the patient was well-appearing. I discussed importance of elevating foot; patient states that he has been walking around a lot and I suspect this may be exacerbating his pain. Recommended follow-up with PCP and adding Zantac to his current medications. Patient voiced understanding of return precautions and was discharged in satisfactory condition.  Final Clinical Impressions(s) / ED Diagnoses   Final diagnoses:  Post-operative pain  Burning chest pain     Discharge Medication List as of 06/13/2016  9:35 PM         Sharlett Iles, MD 06/14/16 0004

## 2016-06-13 NOTE — Discharge Instructions (Signed)
Start taking zantac daily in addition to protonix and follow up with your primary care provider regarding your reflux. Follow up with Dr. [name] regarding foot and continue doxycycline. Return immediately if you have a fever greater than 100.4, severely elevated blood sugars, worsening drainage, or worsening pain of your foot. It is very important for you to elevate your foot as often as possible.

## 2016-06-13 NOTE — ED Notes (Signed)
Patient transported to X-ray 

## 2016-06-13 NOTE — ED Notes (Signed)
Pt returned from X-ray.  

## 2016-06-14 ENCOUNTER — Encounter: Payer: Self-pay | Admitting: Family Medicine

## 2016-06-14 ENCOUNTER — Telehealth: Payer: Self-pay

## 2016-06-14 ENCOUNTER — Ambulatory Visit: Payer: Medicaid Other | Attending: Family Medicine | Admitting: Family Medicine

## 2016-06-14 VITALS — BP 141/85 | HR 82 | Temp 97.7°F | Ht 68.0 in | Wt 192.4 lb

## 2016-06-14 DIAGNOSIS — I10 Essential (primary) hypertension: Secondary | ICD-10-CM | POA: Insufficient documentation

## 2016-06-14 DIAGNOSIS — L97401 Non-pressure chronic ulcer of unspecified heel and midfoot limited to breakdown of skin: Secondary | ICD-10-CM | POA: Insufficient documentation

## 2016-06-14 DIAGNOSIS — Z794 Long term (current) use of insulin: Secondary | ICD-10-CM | POA: Diagnosis not present

## 2016-06-14 DIAGNOSIS — E1165 Type 2 diabetes mellitus with hyperglycemia: Secondary | ICD-10-CM | POA: Diagnosis not present

## 2016-06-14 DIAGNOSIS — E11621 Type 2 diabetes mellitus with foot ulcer: Secondary | ICD-10-CM | POA: Diagnosis not present

## 2016-06-14 DIAGNOSIS — Z79899 Other long term (current) drug therapy: Secondary | ICD-10-CM | POA: Diagnosis not present

## 2016-06-14 DIAGNOSIS — Z89422 Acquired absence of other left toe(s): Secondary | ICD-10-CM | POA: Insufficient documentation

## 2016-06-14 DIAGNOSIS — IMO0002 Reserved for concepts with insufficient information to code with codable children: Secondary | ICD-10-CM

## 2016-06-14 DIAGNOSIS — L608 Other nail disorders: Secondary | ICD-10-CM | POA: Insufficient documentation

## 2016-06-14 DIAGNOSIS — K219 Gastro-esophageal reflux disease without esophagitis: Secondary | ICD-10-CM | POA: Insufficient documentation

## 2016-06-14 DIAGNOSIS — E118 Type 2 diabetes mellitus with unspecified complications: Secondary | ICD-10-CM | POA: Insufficient documentation

## 2016-06-14 DIAGNOSIS — Z7982 Long term (current) use of aspirin: Secondary | ICD-10-CM | POA: Insufficient documentation

## 2016-06-14 DIAGNOSIS — E08621 Diabetes mellitus due to underlying condition with foot ulcer: Secondary | ICD-10-CM

## 2016-06-14 DIAGNOSIS — Z4781 Encounter for orthopedic aftercare following surgical amputation: Secondary | ICD-10-CM | POA: Diagnosis present

## 2016-06-14 DIAGNOSIS — S98132A Complete traumatic amputation of one left lesser toe, initial encounter: Secondary | ICD-10-CM

## 2016-06-14 LAB — GLUCOSE, POCT (MANUAL RESULT ENTRY): POC Glucose: 200 mg/dl — AB (ref 70–99)

## 2016-06-14 LAB — URINE CULTURE
CULTURE: NO GROWTH
Special Requests: NORMAL

## 2016-06-14 MED ORDER — INSULIN GLARGINE 100 UNIT/ML SOLOSTAR PEN: 13.0000 [IU] | PEN_INJECTOR | SUBCUTANEOUS | 11 refills | Status: DC

## 2016-06-14 MED ORDER — RANITIDINE HCL 300 MG PO TABS: 300.0000 mg | ORAL_TABLET | Freq: Every day | ORAL | 5 refills | Status: DC

## 2016-06-14 MED ORDER — CARVEDILOL 25 MG PO TABS: 25.0000 mg | ORAL_TABLET | Freq: Two times a day (BID) | ORAL | 5 refills | Status: DC

## 2016-06-14 NOTE — Progress Notes (Signed)
Subjective:  Patient ID: Shane [name], male    DOB: October 01, 1970  Age: 45 y.o. MRN: [id number]  CC: Routine Post Op   HPI Shane [name] has diabetes with complications,  Anemia, HTN, depression and anxiety, chronic diastolic dysfunction he presents for    1. Post op f/u for amputation of L 3rd toe on 06/09/2016: patient has diabetes. This is his 4th amputation from his L foot. He required amputation due to abscess and osteomyelitis.  His pain is well controlled. He continues to take doxycyline. He is concerned about some pain in his R 2nd toe and discoloration of the toenail.  2. Diabetes: compliant with lantus. No low CBGs. A1c was elevated during recent hospitalization for toe amputation surgery. His early satiety has improved with reglan. He reports that it was recommended that he also start zantac for GERD in addition to the pantoprazole he already takes.    Past Surgical History:  Procedure Laterality Date  . AMPUTATION Left 08/03/2015   Procedure: AMPUTATION LEFT GREAT TOE;  Surgeon: Newt Minion, MD;  Location: [place];  Service: Orthopedics;  Laterality: Left;  . AMPUTATION Left 12/02/2015   Procedure: amputation of left 2nd digit  . AMPUTATION Left 04/10/2016   Procedure: Left 2nd Toe Amputation at MTP Joint;  Surgeon: Newt Minion, MD;  Location: [place];  Service: Orthopedics;  Laterality: Left;  . AMPUTATION Left 06/09/2016   Procedure: AMPUTATION LEFT THIRD TOE;  Surgeon: [name], MD;  Location: [place];  Service: Orthopedics;  Laterality: Left;  . APPLICATION OF WOUND VAC Left 12/19/2015   Procedure: APPLICATION OF WOUND VAC;  Surgeon: Meredith Pel, MD;  Location: [place];  Service: Orthopedics;  Laterality: Left;  . CIRCUMCISION    . COLONOSCOPY N/A 01/22/2016   Procedure: COLONOSCOPY;  Surgeon: Gatha Mayer, MD;  Location: [place];  Service: Endoscopy;  Laterality: N/A;  . ESOPHAGOGASTRODUODENOSCOPY N/A 01/19/2016   Procedure: ESOPHAGOGASTRODUODENOSCOPY (EGD);   Surgeon: Manus Gunning, MD;  Location: [place];  Service: Gastroenterology;  Laterality: N/A;  . ESOPHAGOGASTRODUODENOSCOPY N/A 01/22/2016   Procedure: ESOPHAGOGASTRODUODENOSCOPY (EGD);  Surgeon: Gatha Mayer, MD;  Location: [facility] ENDOSCOPY;  Service: Endoscopy;  Laterality: N/A;  . I&D EXTREMITY Left 12/02/2015   Procedure: IRRIGATION AND DEBRIDEMENT OF FOOT; LEFT SECOND TOE AMPUTATION;  Surgeon: Meredith Pel, MD;  Location: [place];  Service: Orthopedics;  Laterality: Left;  . I&D EXTREMITY Left 12/19/2015   Procedure: I & D LEFT FOOT WITH BEADS ;  Surgeon: Meredith Pel, MD;  Location: [place];  Service: Orthopedics;  Laterality: Left;  . I&D EXTREMITY Right 01/17/2016   Procedure: IRRIGATION AND DEBRIDEMENT RIGHT FOOT;  Surgeon: Newt Minion, MD;  Location: [place];  Service: Orthopedics;  Laterality: Right;  . INCISION AND DRAINAGE FOOT Right 01/17/2016  . INGUINAL HERNIA REPAIR Bilateral ~ 1983- ~ 1986  . TONSILLECTOMY  ~ 62    Social History  Substance Use Topics  . Smoking status: Never Smoker  . Smokeless tobacco: Never Used  . Alcohol use No    Outpatient Medications Prior to Visit  Medication Sig Dispense Refill  . amLODipine (NORVASC) 10 MG tablet Take 1 tablet (10 mg total) by mouth daily. 30 tablet 0  . aspirin EC 81 MG tablet Take 1 tablet (81 mg total) by mouth daily. 30 tablet 11  . atorvastatin (LIPITOR) 80 MG tablet Take 1 tablet (80 mg total) by mouth every morning. (Patient taking differently: Take 80 mg by  mouth daily. ) 30 tablet 5  . carvedilol (COREG) 12.5 MG tablet TAKE 1 TABLET BY MOUTH 2 TIMES DAILY WITH A MEAL 60 tablet 3  . divalproex (DEPAKOTE) 500 MG DR tablet Take 2 tablets (1,000 mg total) by mouth at bedtime. 30 tablet 5  . doxycycline (VIBRA-TABS) 100 MG tablet Take 1 tablet (100 mg total) by mouth every 12 (twelve) hours. 14 tablet 0  . FLUoxetine (PROZAC) 20 MG tablet Take 1 tablet (20 mg total) by mouth every morning. 30 tablet 5  .  gabapentin (NEURONTIN) 100 MG capsule Take 2 capsules (200 mg total) by mouth 3 (three) times daily. 90 capsule 0  . HYDROcodone-acetaminophen (NORCO/VICODIN) 5-325 MG tablet Take 1 tablet by mouth every 6 (six) hours as needed for moderate pain (breakthrough pain). 30 tablet 0  . ibuprofen (ADVIL,MOTRIN) 800 MG tablet TAKE 1 TABLET BY MOUTH EVERY 8 HOURS AS NEEDED. (Patient taking differently: TAKE 1 TABLET BY MOUTH EVERY 8 HOURS AS NEEDED FOR PAIN) 30 tablet 0  . Insulin Glargine (LANTUS SOLOSTAR) 100 UNIT/ML Solostar Pen Inject 10 Units into the skin every morning. (Patient taking differently: Inject 10 Units into the skin daily before breakfast. ) 3 pen 11  . lisinopril (PRINIVIL,ZESTRIL) 40 MG tablet TAKE 1 TABLET BY MOUTH DAILY. 30 tablet 2  . metFORMIN (GLUCOPHAGE) 1000 MG tablet Take 1 tablet (1,000 mg total) by mouth 2 (two) times daily with a meal. 60 tablet 5  . metoCLOPramide (REGLAN) 5 MG tablet Take 1 tablet (5 mg total) by mouth 3 (three) times daily before meals. 90 tablet 2  . ondansetron (ZOFRAN ODT) 8 MG disintegrating tablet Take 1 tablet (8 mg total) by mouth every 8 (eight) hours as needed for nausea or vomiting. 30 tablet 0  . pantoprazole (PROTONIX) 40 MG tablet Take 40 mg by mouth daily.   5  . traZODone (DESYREL) 100 MG tablet Take 1 tablet (100 mg total) by mouth at bedtime. 30 tablet 5  . triamcinolone ointment (KENALOG) 0.5 % Apply 1 application topically 2 (two) times daily. (Patient taking differently: Apply 1 application topically at bedtime as needed (rash). ) 60 g 0  . Vitamin D, Ergocalciferol, (DRISDOL) 50000 units CAPS capsule Take 1 capsule (50,000 Units total) by mouth every 30 (thirty) days. Every 30 days (Patient taking differently: Take 50,000 Units by mouth every 30 (thirty) days. On or about the 1st day of each month) 12 capsule 1   No facility-administered medications prior to visit.     ROS Review of Systems  Constitutional: Positive for fatigue.  Negative for chills, fever and unexpected weight change.  HENT: Positive for hearing loss and tinnitus.   Eyes: Negative for visual disturbance.  Respiratory: Negative for cough, chest tightness and shortness of breath.   Cardiovascular: Negative for chest pain, palpitations and leg swelling.  Gastrointestinal: Negative for abdominal distention, abdominal pain, anal bleeding, blood in stool, constipation, diarrhea, nausea, rectal pain and vomiting.  Endocrine: Negative for polydipsia, polyphagia and polyuria.  Musculoskeletal: Positive for arthralgias. Negative for back pain, gait problem, joint swelling, myalgias and neck pain.  Skin: Positive for wound (dorsum on R foot and L foot ). Negative for rash.  Allergic/Immunologic: Negative for immunocompromised state.  Neurological: Negative for dizziness and numbness.  Hematological: Negative for adenopathy. Does not bruise/bleed easily.  Psychiatric/Behavioral: Negative for dysphoric mood, sleep disturbance and suicidal ideas. The patient is not nervous/anxious.     Objective:  BP (!) 141/85 (BP Location: Left Arm, Patient Position:  Sitting, Cuff Size: Small)   Pulse 82   Temp 97.7 F (36.5 C) (Oral)   Ht 5\' 8"  (1.727 m)   Wt 192 lb 6.4 oz (87.3 kg)   SpO2 99%   BMI 29.25 kg/m   BP/Weight 06/14/2016 06/13/2016 [id number]  Systolic BP [id number] 0000000 [id number]  Diastolic BP 85 123456 61  Wt. (Lbs) 192.4 192 -  BMI 29.25 30.07 -   Physical Exam  Constitutional: He appears well-developed and well-nourished. No distress.  HENT:  Head: Normocephalic and atraumatic.  Neck: Normal range of motion. Neck supple.  Cardiovascular: Normal rate, regular rhythm, normal heart sounds and intact distal pulses.   Pulmonary/Chest: Effort normal and breath sounds normal.  Musculoskeletal: He exhibits no edema.       Feet:  Neurological: He is alert.  Skin: Skin is warm and dry. No rash noted. No erythema.  Psychiatric: He has a normal mood and affect.   Lab  Results  Component Value Date   HGBA1C 8.7 (H) 06/08/2016   CBG 200  Assessment & Plan:   Shane [name] was seen today for routine post op.  Diagnoses and all orders for this visit:  Uncontrolled type 2 diabetes mellitus with complication, without long-term current use of insulin (HCC) -     POCT glucose (manual entry) -     Insulin Glargine (LANTUS SOLOSTAR) 100 UNIT/ML Solostar Pen; Inject 13 Units into the skin every morning. -     Ambulatory referral to [place] toe of left foot ([place]) -     Ambulatory referral to Home Health  Gastroesophageal reflux disease without esophagitis -     ranitidine (ZANTAC) 300 MG tablet; Take 1 tablet (300 mg total) by mouth at bedtime.  Essential hypertension -     carvedilol (COREG) 25 MG tablet; Take 1 tablet (25 mg total) by mouth 2 (two) times daily with a meal.  Diabetic ulcer of midfoot associated with diabetes mellitus due to underlying condition, limited to breakdown of skin, unspecified laterality ([place]) -     DG Foot Complete Right; Future -     Ambulatory referral to [place]     No orders of the defined types were placed in this encounter.   Follow-up: No Follow-up on file.   Boykin Nearing MD

## 2016-06-14 NOTE — Telephone Encounter (Signed)
Dr. Adrian Blackwater wrote [facility] RN orders for wound care for patient. This Case Manager placed call to patient to discuss. Patient agreeable to home health services, and he indicated he would like [facility] RN services with [place]. Patient indicated he has had [facility] services with [place] in the past. Dr. Adrian Blackwater' office note pending. Spoke with Chesley at [facility] who indicated referral must be sent with office note. Awaiting completed office note from 06/14/16.

## 2016-06-14 NOTE — Progress Notes (Signed)
Pt needs a referral to ENT. Pt is requesting a nurse aid for the home.

## 2016-06-14 NOTE — Patient Instructions (Addendum)
Traden was seen today for routine post op.  Diagnoses and all orders for this visit:  Uncontrolled type 2 diabetes mellitus with complication, without long-term current use of insulin (HCC) -     POCT glucose (manual entry) -     Insulin Glargine (LANTUS SOLOSTAR) 100 UNIT/ML Solostar Pen; Inject 13 Units into the skin every morning. -     Ambulatory referral to [place] toe of left foot ([place]) -     Ambulatory referral to Home Health  Gastroesophageal reflux disease without esophagitis -     ranitidine (ZANTAC) 300 MG tablet; Take 1 tablet (300 mg total) by mouth at bedtime.  Essential hypertension -     carvedilol (COREG) 25 MG tablet; Take 1 tablet (25 mg total) by mouth 2 (two) times daily with a meal.  Diabetic ulcer of midfoot associated with diabetes mellitus due to underlying condition, limited to breakdown of skin, unspecified laterality (HCC) -     DG Foot Complete Right; Future -     Ambulatory referral to [place]   F/u in  6 weeks for diabetes   Dr. Adrian Blackwater

## 2016-06-16 NOTE — Assessment & Plan Note (Signed)
Slight decline with rise in A1c Increase lantus to 13 U daily from 10 U

## 2016-06-16 NOTE — Assessment & Plan Note (Signed)
S/p amputation Home health order placed Patient to continue doxycyline and keep close f/u with ortho

## 2016-06-18 ENCOUNTER — Telehealth: Payer: Self-pay

## 2016-06-18 ENCOUNTER — Ambulatory Visit (HOSPITAL_COMMUNITY)
Admission: RE | Admit: 2016-06-18 | Discharge: 2016-06-18 | Disposition: A | Discharge status: Home / Self Care | Payer: Medicaid Other | Source: Ambulatory Visit | Attending: Family Medicine | Admitting: Family Medicine

## 2016-06-18 ENCOUNTER — Encounter (INDEPENDENT_AMBULATORY_CARE_PROVIDER_SITE_OTHER): Payer: Self-pay | Admitting: Orthopaedic Surgery

## 2016-06-18 ENCOUNTER — Ambulatory Visit (INDEPENDENT_AMBULATORY_CARE_PROVIDER_SITE_OTHER): Payer: Medicaid Other | Admitting: Orthopaedic Surgery

## 2016-06-18 VITALS — BP 150/96 | HR 84 | Ht 67.0 in | Wt 192.0 lb

## 2016-06-18 DIAGNOSIS — E11621 Type 2 diabetes mellitus with foot ulcer: Secondary | ICD-10-CM | POA: Insufficient documentation

## 2016-06-18 DIAGNOSIS — L97401 Non-pressure chronic ulcer of unspecified heel and midfoot limited to breakdown of skin: Secondary | ICD-10-CM | POA: Diagnosis not present

## 2016-06-18 DIAGNOSIS — S98132A Complete traumatic amputation of one left lesser toe, initial encounter: Secondary | ICD-10-CM

## 2016-06-18 DIAGNOSIS — Z89422 Acquired absence of other left toe(s): Secondary | ICD-10-CM

## 2016-06-18 DIAGNOSIS — E08621 Diabetes mellitus due to underlying condition with foot ulcer: Secondary | ICD-10-CM

## 2016-06-18 LAB — CULTURE, BLOOD (ROUTINE X 2)
CULTURE: NO GROWTH
CULTURE: NO GROWTH

## 2016-06-18 NOTE — Progress Notes (Signed)
Post-Op Visit Note   Patient: Shane [name]           Date of Birth: 07/29/1970           MRN: [id number] Visit Date: 06/18/2016 PCP: Minerva Ends, MD   Assessment & Plan:  Chief Complaint:  Chief Complaint  Patient presents with  . Left Foot - Routine Post Op   Visit Diagnoses:  1. Amputated toe of left foot ([place])     Plan: Return 2 weeks for wound check and suture removal.  Follow-Up Instructions: Return in about 2 weeks (around 07/02/2016).   Orders:  No orders of the defined types were placed in this encounter.  No orders of the defined types were placed in this encounter.  HPI Patient is status post left third toe amputation 06/09/2016.  He is nine days post op.  He is taking Hydrocodone for pain. He states that it helps a little bit. He continues to take Doxycycline. Dressing was changed one week ago in the ER when patient went due to increased pain. Sutures intact.  PMFS History: Patient Active Problem List   Diagnosis Date Noted  . Osteomyelitis of toe of left foot ([place]) 06/08/2016  . Diabetic gastroparesis ([place]) 05/30/2016  . Chronic ulcer of left foot ([place]) 05/22/2016  . Dermatitis 04/26/2016  . Buzzing in ear, right 03/26/2016  . Joint pain 03/11/2016  . Weakness 03/08/2016  . Dizziness 03/03/2016  . Diabetic ulcer of right foot associated with type 2 diabetes mellitus, with necrosis of muscle ([place]) 02/21/2016  . Anemia, iron deficiency   . Chronic diastolic CHF (congestive heart failure), NYHA class 1 ([place]) 12/30/2015  . Diabetes mellitus type 2, uncontrolled, with complications ([place]) [id number]  . Depression with anxiety 12/01/2015  . Pain in the chest 12/01/2015  . Insomnia 11/21/2015  . GERD (gastroesophageal reflux disease) 08/18/2015  . Amputated toe of left foot ([place]) 08/18/2015  . Abscess of third toe, left 08/01/2015  . Erectile dysfunction 07/04/2015  . Anemia of infection and chronic disease 04/24/2015  . Left arm weakness 04/02/2015   . Sensory disturbance 04/02/2015  . Essential hypertension 04/02/2015  . Hemispheric carotid artery syndrome   . HLD (hyperlipidemia)   . Headache    Past Medical History:  Diagnosis Date  . Anemia   . Bipolar disorder ([place])   . Chest pain    a. 2015 Reportedly normal stress test in [place].  [place] Chronic diastolic CHF (congestive heart failure) (HCC)    a.03/2015 Echo: EF 55-60%, Gr 1 DD, mild MR, triv PR.  . Depression   . Depression with anxiety   . Dyspnea    with exertion  . GERD (gastroesophageal reflux disease)   . Hyperlipidemia   . Hypertension    a. 08/2014 Admitted with hypertensive urgency.  . Insomnia   . Internal carotid artery stenosis   . Lower GI bleed 07/04/2015  . Neuropathy ([employer])   . Refusal of blood transfusions as patient is Jehovah's Witness   . TIA (transient ischemic attack) 08/2014; 03/2015   a. 08/2014 in setting of hypertensive urgency.  . Type II diabetes mellitus ([employer])    started insulin spring 2016, Type II  . Vitamin D deficiency spring 2016    Family History  Problem Relation Age of Onset  . Hypertension Mother   . Diabetes Mother   . Hyperlipidemia Mother   . Heart disease Mother     s/p pacemaker  . Diabetes Father   . Hypertension Father   .  Stroke Father   . Heart attack Father     first MI @ 53.  . Stroke Brother     Past Surgical History:  Procedure Laterality Date  . AMPUTATION Left 08/03/2015   Procedure: AMPUTATION LEFT GREAT TOE;  Surgeon: Newt Minion, MD;  Location: [place];  Service: Orthopedics;  Laterality: Left;  . AMPUTATION Left 12/02/2015   Procedure: amputation of left 2nd digit  . AMPUTATION Left 04/10/2016   Procedure: Left 2nd Toe Amputation at MTP Joint;  Surgeon: Newt Minion, MD;  Location: [place];  Service: Orthopedics;  Laterality: Left;  . AMPUTATION Left 06/09/2016   Procedure: AMPUTATION LEFT THIRD TOE;  Surgeon: [name], MD;  Location: [place];  Service: Orthopedics;  Laterality: Left;  . APPLICATION OF  WOUND VAC Left 12/19/2015   Procedure: APPLICATION OF WOUND VAC;  Surgeon: Meredith Pel, MD;  Location: [place];  Service: Orthopedics;  Laterality: Left;  . CIRCUMCISION    . COLONOSCOPY N/A 01/22/2016   Procedure: COLONOSCOPY;  Surgeon: Gatha Mayer, MD;  Location: [place];  Service: Endoscopy;  Laterality: N/A;  . ESOPHAGOGASTRODUODENOSCOPY N/A 01/19/2016   Procedure: ESOPHAGOGASTRODUODENOSCOPY (EGD);  Surgeon: Manus Gunning, MD;  Location: [place];  Service: Gastroenterology;  Laterality: N/A;  . ESOPHAGOGASTRODUODENOSCOPY N/A 01/22/2016   Procedure: ESOPHAGOGASTRODUODENOSCOPY (EGD);  Surgeon: Gatha Mayer, MD;  Location: [facility] ENDOSCOPY;  Service: Endoscopy;  Laterality: N/A;  . I&D EXTREMITY Left 12/02/2015   Procedure: IRRIGATION AND DEBRIDEMENT OF FOOT; LEFT SECOND TOE AMPUTATION;  Surgeon: Meredith Pel, MD;  Location: [place];  Service: Orthopedics;  Laterality: Left;  . I&D EXTREMITY Left 12/19/2015   Procedure: I & D LEFT FOOT WITH BEADS ;  Surgeon: Meredith Pel, MD;  Location: [place];  Service: Orthopedics;  Laterality: Left;  . I&D EXTREMITY Right 01/17/2016   Procedure: IRRIGATION AND DEBRIDEMENT RIGHT FOOT;  Surgeon: Newt Minion, MD;  Location: [place];  Service: Orthopedics;  Laterality: Right;  . INCISION AND DRAINAGE FOOT Right 01/17/2016  . INGUINAL HERNIA REPAIR Bilateral ~ 1983- ~ 1986  . TONSILLECTOMY  ~ 69   Social History   Occupational History  . Not on file.   Social History Main Topics  . Smoking status: Never Smoker  . Smokeless tobacco: Never Used  . Alcohol use No  . Drug use: No  . Sexual activity: No

## 2016-06-18 NOTE — Telephone Encounter (Signed)
Complete referral , including provider note, faxed to [place].

## 2016-06-18 NOTE — Telephone Encounter (Signed)
Yes his wife can be taught to do wound care He is advised to have dry dressing with coban  He is advised to change dressing every 2-3 days

## 2016-06-18 NOTE — Telephone Encounter (Signed)
Wound care orders faxed to [facility].

## 2016-06-18 NOTE — Telephone Encounter (Signed)
Call received from [facility] with [place] # [phone] inquiring what the wound care orders are and and how frequently the dressing needs to be changed. She would also like to know if there is a caregiver that can be instructed how to do the wound care as his insurance will most likely not cover daily visits for the nurse to change a dressing. Informed her that this information would be routed to Dr Adrian Blackwater.

## 2016-06-18 NOTE — Telephone Encounter (Signed)
Call placed to [facility] at [facility] # [phone] and informed her of the wound care orders. She was appreciative of the information and requested that a written order be obtained and be faxed to [facility].

## 2016-06-18 NOTE — Telephone Encounter (Signed)
Orders written on prescription pad

## 2016-06-19 ENCOUNTER — Telehealth: Payer: Self-pay

## 2016-06-19 NOTE — Telephone Encounter (Signed)
Call placed to [place] to determine if they received wound care orders, and if they are able to provide home health services. Spoke with Rachel Moulds who indicated wound care orders were received. [facility] able to provide [facility] services. Start of care scheduled for 06/19/16 or 06/20/16. No additional needs identified.

## 2016-06-20 ENCOUNTER — Ambulatory Visit (INDEPENDENT_AMBULATORY_CARE_PROVIDER_SITE_OTHER): Payer: Medicaid Other | Admitting: Orthopedic Surgery

## 2016-06-20 ENCOUNTER — Encounter (INDEPENDENT_AMBULATORY_CARE_PROVIDER_SITE_OTHER): Payer: Self-pay

## 2016-06-23 ENCOUNTER — Encounter (HOSPITAL_COMMUNITY): Payer: Self-pay

## 2016-06-23 ENCOUNTER — Inpatient Hospital Stay (HOSPITAL_COMMUNITY)
Admission: EM | Admit: 2016-06-23 | Discharge: 2016-07-03 | DRG: 617 | Disposition: A | Discharge status: Skilled Nursing Facility | Payer: Medicaid Other | Attending: Internal Medicine | Admitting: Internal Medicine

## 2016-06-23 ENCOUNTER — Emergency Department (HOSPITAL_COMMUNITY): Payer: Medicaid Other

## 2016-06-23 DIAGNOSIS — N179 Acute kidney failure, unspecified: Secondary | ICD-10-CM | POA: Diagnosis present

## 2016-06-23 DIAGNOSIS — E785 Hyperlipidemia, unspecified: Secondary | ICD-10-CM | POA: Diagnosis present

## 2016-06-23 DIAGNOSIS — K5900 Constipation, unspecified: Secondary | ICD-10-CM | POA: Diagnosis not present

## 2016-06-23 DIAGNOSIS — K219 Gastro-esophageal reflux disease without esophagitis: Secondary | ICD-10-CM | POA: Diagnosis present

## 2016-06-23 DIAGNOSIS — F418 Other specified anxiety disorders: Secondary | ICD-10-CM | POA: Diagnosis present

## 2016-06-23 DIAGNOSIS — E1110 Type 2 diabetes mellitus with ketoacidosis without coma: Secondary | ICD-10-CM

## 2016-06-23 DIAGNOSIS — M869 Osteomyelitis, unspecified: Secondary | ICD-10-CM

## 2016-06-23 DIAGNOSIS — M79662 Pain in left lower leg: Secondary | ICD-10-CM

## 2016-06-23 DIAGNOSIS — R0602 Shortness of breath: Secondary | ICD-10-CM

## 2016-06-23 DIAGNOSIS — Z89422 Acquired absence of other left toe(s): Secondary | ICD-10-CM

## 2016-06-23 DIAGNOSIS — E1143 Type 2 diabetes mellitus with diabetic autonomic (poly)neuropathy: Secondary | ICD-10-CM | POA: Diagnosis present

## 2016-06-23 DIAGNOSIS — Z8739 Personal history of other diseases of the musculoskeletal system and connective tissue: Secondary | ICD-10-CM

## 2016-06-23 DIAGNOSIS — R509 Fever, unspecified: Secondary | ICD-10-CM

## 2016-06-23 DIAGNOSIS — R0989 Other specified symptoms and signs involving the circulatory and respiratory systems: Secondary | ICD-10-CM

## 2016-06-23 DIAGNOSIS — E1169 Type 2 diabetes mellitus with other specified complication: Principal | ICD-10-CM | POA: Diagnosis present

## 2016-06-23 DIAGNOSIS — F319 Bipolar disorder, unspecified: Secondary | ICD-10-CM | POA: Diagnosis present

## 2016-06-23 DIAGNOSIS — IMO0002 Reserved for concepts with insufficient information to code with codable children: Secondary | ICD-10-CM

## 2016-06-23 DIAGNOSIS — R0600 Dyspnea, unspecified: Secondary | ICD-10-CM

## 2016-06-23 DIAGNOSIS — E1142 Type 2 diabetes mellitus with diabetic polyneuropathy: Secondary | ICD-10-CM | POA: Diagnosis present

## 2016-06-23 DIAGNOSIS — Z22322 Carrier or suspected carrier of Methicillin resistant Staphylococcus aureus: Secondary | ICD-10-CM

## 2016-06-23 DIAGNOSIS — M868X7 Other osteomyelitis, ankle and foot: Secondary | ICD-10-CM | POA: Diagnosis present

## 2016-06-23 DIAGNOSIS — Z7982 Long term (current) use of aspirin: Secondary | ICD-10-CM

## 2016-06-23 DIAGNOSIS — L03116 Cellulitis of left lower limb: Secondary | ICD-10-CM | POA: Diagnosis present

## 2016-06-23 DIAGNOSIS — M7989 Other specified soft tissue disorders: Secondary | ICD-10-CM

## 2016-06-23 DIAGNOSIS — R0902 Hypoxemia: Secondary | ICD-10-CM

## 2016-06-23 DIAGNOSIS — Z89432 Acquired absence of left foot: Secondary | ICD-10-CM

## 2016-06-23 DIAGNOSIS — Z8673 Personal history of transient ischemic attack (TIA), and cerebral infarction without residual deficits: Secondary | ICD-10-CM

## 2016-06-23 DIAGNOSIS — G4700 Insomnia, unspecified: Secondary | ICD-10-CM | POA: Diagnosis present

## 2016-06-23 DIAGNOSIS — J9811 Atelectasis: Secondary | ICD-10-CM | POA: Diagnosis not present

## 2016-06-23 DIAGNOSIS — E869 Volume depletion, unspecified: Secondary | ICD-10-CM | POA: Diagnosis not present

## 2016-06-23 DIAGNOSIS — K3184 Gastroparesis: Secondary | ICD-10-CM | POA: Diagnosis present

## 2016-06-23 DIAGNOSIS — E1165 Type 2 diabetes mellitus with hyperglycemia: Secondary | ICD-10-CM | POA: Diagnosis present

## 2016-06-23 DIAGNOSIS — E118 Type 2 diabetes mellitus with unspecified complications: Secondary | ICD-10-CM

## 2016-06-23 DIAGNOSIS — L0390 Cellulitis, unspecified: Secondary | ICD-10-CM | POA: Diagnosis present

## 2016-06-23 DIAGNOSIS — R42 Dizziness and giddiness: Secondary | ICD-10-CM

## 2016-06-23 DIAGNOSIS — D638 Anemia in other chronic diseases classified elsewhere: Secondary | ICD-10-CM | POA: Diagnosis present

## 2016-06-23 DIAGNOSIS — Z794 Long term (current) use of insulin: Secondary | ICD-10-CM

## 2016-06-23 DIAGNOSIS — I5032 Chronic diastolic (congestive) heart failure: Secondary | ICD-10-CM | POA: Diagnosis present

## 2016-06-23 DIAGNOSIS — I110 Hypertensive heart disease with heart failure: Secondary | ICD-10-CM | POA: Diagnosis present

## 2016-06-23 DIAGNOSIS — D649 Anemia, unspecified: Secondary | ICD-10-CM | POA: Diagnosis present

## 2016-06-23 DIAGNOSIS — Z79899 Other long term (current) drug therapy: Secondary | ICD-10-CM

## 2016-06-23 LAB — CBC WITH DIFFERENTIAL/PLATELET
BASOS ABS: 0 10*3/uL (ref 0.0–0.1)
BASOS PCT: 0 %
Eosinophils Absolute: 0.3 10*3/uL (ref 0.0–0.7)
Eosinophils Relative: 4 %
HEMATOCRIT: 29.7 % — AB (ref 39.0–52.0)
HEMOGLOBIN: 9.7 g/dL — AB (ref 13.0–17.0)
LYMPHS PCT: 24 %
Lymphs Abs: 2.1 10*3/uL (ref 0.7–4.0)
MCH: 30.4 pg (ref 26.0–34.0)
MCHC: 32.7 g/dL (ref 30.0–36.0)
MCV: 93.1 fL (ref 78.0–100.0)
MONO ABS: 0.5 10*3/uL (ref 0.1–1.0)
MONOS PCT: 5 %
NEUTROS ABS: 5.8 10*3/uL (ref 1.7–7.7)
NEUTROS PCT: 67 %
Platelets: 230 10*3/uL (ref 150–400)
RBC: 3.19 MIL/uL — ABNORMAL LOW (ref 4.22–5.81)
RDW: 14.6 % (ref 11.5–15.5)
WBC: 8.8 10*3/uL (ref 4.0–10.5)

## 2016-06-23 MED ORDER — HYDROMORPHONE HCL 2 MG/ML IJ SOLN
0.5000 mg | Freq: Once | INTRAMUSCULAR | Status: AC
Start: 2016-06-23 — End: 2016-06-23
  Administered 2016-06-23: 0.5 mg via INTRAVENOUS
  Filled 2016-06-23: qty 1

## 2016-06-23 MED ORDER — VANCOMYCIN HCL IN DEXTROSE 1-5 GM/200ML-% IV SOLN
1000.0000 mg | Freq: Once | INTRAVENOUS | Status: AC
  Administered 2016-06-23: 1000 mg via INTRAVENOUS
  Filled 2016-06-23: qty 200

## 2016-06-23 MED ORDER — SODIUM CHLORIDE 0.9 % IV BOLUS (SEPSIS)
500.0000 mL | Freq: Once | INTRAVENOUS | Status: AC
  Administered 2016-06-23: 500 mL via INTRAVENOUS

## 2016-06-23 NOTE — ED Provider Notes (Signed)
[facility] DEPT Provider Note   CSN: [id number] Arrival date & time: 06/23/16  2004     History   Chief Complaint Chief Complaint  Patient presents with  . Leg Pain    HPI Shane [name] is a 46 y.o. male.  Patient with history of poorly controlled T2DM, TIA, CHF, GERD, HTN, HLD, osteomyelitis requiring amputations presents with complaint of left LE redness, pain and swelling. He underwent a 3rd toe amputation on 06/10/16 [name]) and has been doing well post-surgery with localized pain only. Yesterday, the pain increased and extended to proximal lower leg and was associated with mild swelling of the leg and foot, and erythema of foot and leg. He reports tactile fever x 2 days. No vomiting, diarrhea, cough, congestion or urinary symptoms.    The history is provided by the patient. No language interpreter was used.  Leg Pain      Past Medical History:  Diagnosis Date  . Anemia   . Bipolar disorder ([place])   . Chest pain    a. 2015 Reportedly normal stress test in [place].  Marland Kitchen Chronic diastolic CHF (congestive heart failure) (HCC)    a.03/2015 Echo: EF 55-60%, Gr 1 DD, mild MR, triv PR.  . Depression   . Depression with anxiety   . Dyspnea    with exertion  . GERD (gastroesophageal reflux disease)   . Hyperlipidemia   . Hypertension    a. 08/2014 Admitted with hypertensive urgency.  . Insomnia   . Internal carotid artery stenosis   . Lower GI bleed 07/04/2015  . Neuropathy ([place])   . Refusal of blood transfusions as patient is Jehovah's Witness   . TIA (transient ischemic attack) 08/2014; 03/2015   a. 08/2014 in setting of hypertensive urgency.  . Type II diabetes mellitus ([place])    started insulin spring 2016, Type II  . Vitamin D deficiency spring 2016    Patient Active Problem List   Diagnosis Date Noted  . Osteomyelitis of toe of left foot ([place]) 06/08/2016  . Diabetic gastroparesis ([place]) 05/30/2016  . Chronic ulcer of left foot ([place]) 05/22/2016  . Dermatitis 04/26/2016    . Buzzing in ear, right 03/26/2016  . Joint pain 03/11/2016  . Weakness 03/08/2016  . Dizziness 03/03/2016  . Diabetic ulcer of right foot associated with type 2 diabetes mellitus, with necrosis of muscle ([place]) 02/21/2016  . Anemia, iron deficiency   . Chronic diastolic CHF (congestive heart failure), NYHA class 1 ([place]) 12/30/2015  . Diabetes mellitus type 2, uncontrolled, with complications ([place]) [id number]  . Depression with anxiety 12/01/2015  . Pain in the chest 12/01/2015  . Insomnia 11/21/2015  . GERD (gastroesophageal reflux disease) 08/18/2015  . Amputated toe of left foot ([place]) 08/18/2015  . Abscess of third toe, left 08/01/2015  . Erectile dysfunction 07/04/2015  . Anemia of infection and chronic disease 04/24/2015  . Left arm weakness 04/02/2015  . Sensory disturbance 04/02/2015  . Essential hypertension 04/02/2015  . Hemispheric carotid artery syndrome   . HLD (hyperlipidemia)   . Headache     Past Surgical History:  Procedure Laterality Date  . AMPUTATION Left 08/03/2015   Procedure: AMPUTATION LEFT GREAT TOE;  Surgeon: Newt Minion, MD;  Location: [facility];  Service: Orthopedics;  Laterality: Left;  . AMPUTATION Left 12/02/2015   Procedure: amputation of left 2nd digit  . AMPUTATION Left 04/10/2016   Procedure: Left 2nd Toe Amputation at MTP Joint;  Surgeon: Newt Minion, MD;  Location: [facility];  Service: Orthopedics;  Laterality: Left;  . AMPUTATION Left 06/09/2016   Procedure: AMPUTATION LEFT THIRD TOE;  Surgeon: [name], MD;  Location: [facility];  Service: Orthopedics;  Laterality: Left;  . APPLICATION OF WOUND VAC Left 12/19/2015   Procedure: APPLICATION OF WOUND VAC;  Surgeon: Meredith Pel, MD;  Location: [facility];  Service: Orthopedics;  Laterality: Left;  . CIRCUMCISION    . COLONOSCOPY N/A 01/22/2016   Procedure: COLONOSCOPY;  Surgeon: Gatha Mayer, MD;  Location: [facility];  Service: Endoscopy;  Laterality: N/A;  . ESOPHAGOGASTRODUODENOSCOPY N/A  01/19/2016   Procedure: ESOPHAGOGASTRODUODENOSCOPY (EGD);  Surgeon: Manus Gunning, MD;  Location: [facility];  Service: Gastroenterology;  Laterality: N/A;  . ESOPHAGOGASTRODUODENOSCOPY N/A 01/22/2016   Procedure: ESOPHAGOGASTRODUODENOSCOPY (EGD);  Surgeon: Gatha Mayer, MD;  Location: [facility];  Service: Endoscopy;  Laterality: N/A;  . I&D EXTREMITY Left 12/02/2015   Procedure: IRRIGATION AND DEBRIDEMENT OF FOOT; LEFT SECOND TOE AMPUTATION;  Surgeon: Meredith Pel, MD;  Location: [facility];  Service: Orthopedics;  Laterality: Left;  . I&D EXTREMITY Left 12/19/2015   Procedure: I & D LEFT FOOT WITH BEADS ;  Surgeon: Meredith Pel, MD;  [facility];  Service: Orthopedics;  Laterality: Left;  . I&D EXTREMITY Right 01/17/2016   Procedure: IRRIGATION AND DEBRIDEMENT RIGHT FOOT;  Surgeon: Newt Minion, MD;  Location: [facility];  Service: Orthopedics;  Laterality: Right;  . INCISION AND DRAINAGE FOOT Right 01/17/2016  . INGUINAL HERNIA REPAIR Bilateral ~ 1983- ~ 1986  . TONSILLECTOMY  ~ 1985       Home Medications    Prior to Admission medications   Medication Sig Start Date End Date Taking? Authorizing Provider  amLODipine (NORVASC) 10 MG tablet Take 1 tablet (10 mg total) by mouth daily. 01/23/16  Yes Hosie Poisson, MD  aspirin EC 81 MG tablet Take 1 tablet (81 mg total) by mouth daily. 03/25/16  Yes Josalyn Funches, MD  atorvastatin (LIPITOR) 80 MG tablet Take 1 tablet (80 mg total) by mouth every morning. Patient taking differently: Take 80 mg by mouth daily.  08/18/15  Yes Josalyn Funches, MD  carvedilol (COREG) 25 MG tablet Take 1 tablet (25 mg total) by mouth 2 (two) times daily with a meal. 06/14/16  Yes Josalyn Funches, MD  divalproex (DEPAKOTE) 500 MG DR tablet Take 2 tablets (1,000 mg total) by mouth at bedtime. 12/04/15  Yes Christina P Rama, MD  FLUoxetine (PROZAC) 20 MG tablet Take 1 tablet (20 mg total) by mouth every morning. 03/25/16  Yes Josalyn Funches, MD  gabapentin  (NEURONTIN) 100 MG capsule Take 2 capsules (200 mg total) by mouth 3 (three) times daily. 01/23/16  Yes Hosie Poisson, MD  ibuprofen (ADVIL,MOTRIN) 800 MG tablet TAKE 1 TABLET BY MOUTH EVERY 8 HOURS AS NEEDED. Patient taking differently: TAKE 1 TABLET BY MOUTH EVERY 8 HOURS AS NEEDED FOR PAIN 05/28/16  Yes Josalyn Funches, MD  Insulin Glargine (LANTUS SOLOSTAR) 100 UNIT/ML Solostar Pen Inject 13 Units into the skin every morning. Patient taking differently: Inject 10 Units into the skin every morning.  06/14/16  Yes Josalyn Funches, MD  lisinopril (PRINIVIL,ZESTRIL) 40 MG tablet TAKE 1 TABLET BY MOUTH DAILY. 05/21/16  Yes Boykin Nearing, MD  metFORMIN (GLUCOPHAGE) 1000 MG tablet Take 1 tablet (1,000 mg total) by mouth 2 (two) times daily with a meal. 12/22/15  Yes Ripudeep K Rai, MD  metoCLOPramide (REGLAN) 5 MG tablet Take 1 tablet (5 mg total) by mouth 3 (three)  times daily before meals. 05/30/16  Yes Josalyn Funches, MD  ondansetron (ZOFRAN ODT) 8 MG disintegrating tablet Take 1 tablet (8 mg total) by mouth every 8 (eight) hours as needed for nausea or vomiting. 02/26/16  Yes Josalyn Funches, MD  pantoprazole (PROTONIX) 40 MG tablet Take 40 mg by mouth daily.  04/23/16  Yes Historical Provider, MD  ranitidine (ZANTAC) 300 MG tablet Take 1 tablet (300 mg total) by mouth at bedtime. 06/14/16  Yes Josalyn Funches, MD  traZODone (DESYREL) 100 MG tablet Take 1 tablet (100 mg total) by mouth at bedtime. 02/26/16  Yes Josalyn Funches, MD  triamcinolone ointment (KENALOG) 0.5 % Apply 1 application topically 2 (two) times daily. Patient taking differently: Apply 1 application topically at bedtime as needed (rash).  04/26/16  Yes Josalyn Funches, MD  Vitamin D, Ergocalciferol, (DRISDOL) 50000 units CAPS capsule Take 1 capsule (50,000 Units total) by mouth every 30 (thirty) days. Every 30 days Patient taking differently: Take 50,000 Units by mouth every 30 (thirty) days. On or about the 1st day of each month 04/26/16   Yes Boykin Nearing, MD    Family History Family History  Problem Relation Age of Onset  . Hypertension Mother   . Diabetes Mother   . Hyperlipidemia Mother   . Heart disease Mother     s/p pacemaker  . Diabetes Father   . Hypertension Father   . Stroke Father   . Heart attack Father     first MI @ 69.  . Stroke Brother     Social History Social History  Substance Use Topics  . Smoking status: Never Smoker  . Smokeless tobacco: Never Used  . Alcohol use No     Allergies   Lactose intolerance (gi); Other; and Milk-related compounds   Review of Systems Review of Systems  Constitutional: Positive for chills.  HENT: Negative.  Negative for congestion and sore throat.   Respiratory: Negative.  Negative for shortness of breath.   Cardiovascular: Negative.  Negative for chest pain.  Gastrointestinal: Negative.  Negative for vomiting.  Genitourinary: Negative.  Negative for dysuria.  Musculoskeletal:       See HPI.  Skin: Positive for color change. Negative for wound.  Neurological: Negative.      Physical Exam Updated Vital Signs BP 130/80   Pulse 80   Temp 98 F (36.7 C) (Oral)   Resp (!) 29   SpO2 99%   Physical Exam  Constitutional: He is oriented to person, place, and time. He appears well-developed and well-nourished. No distress.  HENT:  Head: Normocephalic.  Neck: Normal range of motion. Neck supple.  Cardiovascular: Normal rate, regular rhythm and intact distal pulses.   Pulses present in left foot, thready DP pulse.  Pulmonary/Chest: Effort normal and breath sounds normal. He has no wheezes. He has no rales.  Abdominal: Soft. Bowel sounds are normal. There is no tenderness. There is no rebound and no guarding.  Musculoskeletal: Normal range of motion.  Left LE: s/p 3rd, 2nd and great toe amputations, site unremarkable. There is extensive erythema at the medial proximal foot into the majority of the lower leg. The leg and foot are generally tender  without specific calf tenderness. No wounds or drainage. Warm to touch.   Neurological: He is alert and oriented to person, place, and time.  Skin: Skin is warm and dry. No rash noted.  Psychiatric: He has a normal mood and affect.     ED Treatments / Results  Labs (all labs  ordered are listed, but only abnormal results are displayed) Labs Reviewed  CBC WITH DIFFERENTIAL/PLATELET  COMPREHENSIVE METABOLIC PANEL  LACTIC ACID, PLASMA  LACTIC ACID, PLASMA    EKG  EKG Interpretation None       Radiology Dg Foot Complete Left  Result Date: 06/23/2016 CLINICAL DATA:  Redness and swelling beginning today extending from the left ankle to the midshaft left tibia and fibula. Previous amputation of left toes. History of osteomyelitis. EXAM: LEFT FOOT - COMPLETE 3+ VIEW COMPARISON:  MRI left foot 06/09/2016.  Left foot 06/08/2016. FINDINGS: Postoperative changes with amputations of the first, second, and third toes at the level of the metatarsal phalangeal joint. Amputation of the third toe is new since previous study. Periosteal reaction along the shaft of the second metatarsal is unchanged since prior study and probably represents chronic process. No evidence of acute cortical erosion or sclerosis to suggest residual or recurrent osteomyelitis. No focal bone lesion or bone destruction. Degenerative changes in the intertarsal joints. Prominent vascular calcifications. IMPRESSION: Amputations of the left first, second, and third toes. No acute bone erosions to suggest residual or recurrent osteomyelitis. Electronically Signed   By: Lucienne Capers M.D.   On: 06/23/2016 23:35    Procedures Procedures (including critical care time)  Medications Ordered in ED Medications  HYDROmorphone (DILAUDID) injection 0.5 mg (not administered)  vancomycin (VANCOCIN) IVPB 1000 mg/200 mL premix (1,000 mg Intravenous New Bag/Given 06/23/16 2349)  sodium chloride 0.9 % bolus 500 mL (500 mLs Intravenous New  Bag/Given 06/23/16 2348)     Initial Impression / Assessment and Plan / ED Course  I have reviewed the triage vital signs and the nursing notes.  Pertinent labs & imaging results that were available during my care of the patient were reviewed by me and considered in my medical decision making (see chart for details).  Clinical Course     Patient presents with redness and pain to left lower extremity. History of uncontrolled DM and osteomyelitis. DDx: DVT, less likely vs cellulitis, suspected with subjective fever, color change and pain in the setting of prior history. Lab pending. Anticipate admission. IV started, Vancomycin ordered.   No leukocytosis. Mildly elevated lactate. Discussed with OPC (bounceback from previous admission) who accepts for admission.  Final Clinical Impressions(s) / ED Diagnoses   Final diagnoses:  History of osteomyelitis  Cellulitis, left LE  New Prescriptions New Prescriptions   No medications on file     Charlann Lange, PA-C 06/24/16 0154    Nat Christen, MD 06/24/16 1315

## 2016-06-23 NOTE — ED Notes (Signed)
Patient transported to X-ray 

## 2016-06-23 NOTE — ED Triage Notes (Signed)
Pt complaining of L calf pain and swelling that radiates to L ankle. Pt states recent amputation d/t infection. Pt denies any current bleeding/discharge from foot. Pt states fever and chills at night.

## 2016-06-24 ENCOUNTER — Inpatient Hospital Stay (HOSPITAL_COMMUNITY): Payer: Medicaid Other

## 2016-06-24 ENCOUNTER — Inpatient Hospital Stay (HOSPITAL_COMMUNITY)
Admit: 2016-06-24 | Discharge: 2016-06-24 | Disposition: A | Discharge status: Home / Self Care | Payer: Medicaid Other | Attending: Oncology | Admitting: Oncology

## 2016-06-24 ENCOUNTER — Encounter (HOSPITAL_COMMUNITY): Payer: Self-pay | Admitting: General Practice

## 2016-06-24 DIAGNOSIS — L03116 Cellulitis of left lower limb: Secondary | ICD-10-CM | POA: Diagnosis present

## 2016-06-24 DIAGNOSIS — E785 Hyperlipidemia, unspecified: Secondary | ICD-10-CM | POA: Diagnosis present

## 2016-06-24 DIAGNOSIS — Z79899 Other long term (current) drug therapy: Secondary | ICD-10-CM | POA: Diagnosis not present

## 2016-06-24 DIAGNOSIS — N179 Acute kidney failure, unspecified: Secondary | ICD-10-CM

## 2016-06-24 DIAGNOSIS — M7989 Other specified soft tissue disorders: Secondary | ICD-10-CM

## 2016-06-24 DIAGNOSIS — M79609 Pain in unspecified limb: Secondary | ICD-10-CM | POA: Diagnosis not present

## 2016-06-24 DIAGNOSIS — E1142 Type 2 diabetes mellitus with diabetic polyneuropathy: Secondary | ICD-10-CM | POA: Diagnosis present

## 2016-06-24 DIAGNOSIS — G4700 Insomnia, unspecified: Secondary | ICD-10-CM | POA: Diagnosis present

## 2016-06-24 DIAGNOSIS — Z89422 Acquired absence of other left toe(s): Secondary | ICD-10-CM

## 2016-06-24 DIAGNOSIS — Z22322 Carrier or suspected carrier of Methicillin resistant Staphylococcus aureus: Secondary | ICD-10-CM | POA: Diagnosis not present

## 2016-06-24 DIAGNOSIS — E1143 Type 2 diabetes mellitus with diabetic autonomic (poly)neuropathy: Secondary | ICD-10-CM | POA: Diagnosis present

## 2016-06-24 DIAGNOSIS — M868X7 Other osteomyelitis, ankle and foot: Secondary | ICD-10-CM | POA: Diagnosis present

## 2016-06-24 DIAGNOSIS — M86272 Subacute osteomyelitis, left ankle and foot: Secondary | ICD-10-CM | POA: Diagnosis not present

## 2016-06-24 DIAGNOSIS — E1165 Type 2 diabetes mellitus with hyperglycemia: Secondary | ICD-10-CM | POA: Diagnosis present

## 2016-06-24 DIAGNOSIS — E1169 Type 2 diabetes mellitus with other specified complication: Secondary | ICD-10-CM | POA: Diagnosis present

## 2016-06-24 DIAGNOSIS — Z9981 Dependence on supplemental oxygen: Secondary | ICD-10-CM | POA: Diagnosis not present

## 2016-06-24 DIAGNOSIS — J9811 Atelectasis: Secondary | ICD-10-CM | POA: Diagnosis not present

## 2016-06-24 DIAGNOSIS — Z8249 Family history of ischemic heart disease and other diseases of the circulatory system: Secondary | ICD-10-CM

## 2016-06-24 DIAGNOSIS — L0390 Cellulitis, unspecified: Secondary | ICD-10-CM | POA: Diagnosis present

## 2016-06-24 DIAGNOSIS — Z794 Long term (current) use of insulin: Secondary | ICD-10-CM

## 2016-06-24 DIAGNOSIS — Z823 Family history of stroke: Secondary | ICD-10-CM

## 2016-06-24 DIAGNOSIS — D649 Anemia, unspecified: Secondary | ICD-10-CM | POA: Diagnosis not present

## 2016-06-24 DIAGNOSIS — D638 Anemia in other chronic diseases classified elsewhere: Secondary | ICD-10-CM | POA: Diagnosis present

## 2016-06-24 DIAGNOSIS — B9689 Other specified bacterial agents as the cause of diseases classified elsewhere: Secondary | ICD-10-CM

## 2016-06-24 DIAGNOSIS — F319 Bipolar disorder, unspecified: Secondary | ICD-10-CM

## 2016-06-24 DIAGNOSIS — I10 Essential (primary) hypertension: Secondary | ICD-10-CM | POA: Diagnosis not present

## 2016-06-24 DIAGNOSIS — Z7982 Long term (current) use of aspirin: Secondary | ICD-10-CM | POA: Diagnosis not present

## 2016-06-24 DIAGNOSIS — J9589 Other postprocedural complications and disorders of respiratory system, not elsewhere classified: Secondary | ICD-10-CM | POA: Diagnosis not present

## 2016-06-24 DIAGNOSIS — Z9109 Other allergy status, other than to drugs and biological substances: Secondary | ICD-10-CM

## 2016-06-24 DIAGNOSIS — K3184 Gastroparesis: Secondary | ICD-10-CM

## 2016-06-24 DIAGNOSIS — R110 Nausea: Secondary | ICD-10-CM

## 2016-06-24 DIAGNOSIS — I110 Hypertensive heart disease with heart failure: Secondary | ICD-10-CM

## 2016-06-24 DIAGNOSIS — F418 Other specified anxiety disorders: Secondary | ICD-10-CM | POA: Diagnosis present

## 2016-06-24 DIAGNOSIS — Z91011 Allergy to milk products: Secondary | ICD-10-CM

## 2016-06-24 DIAGNOSIS — I5032 Chronic diastolic (congestive) heart failure: Secondary | ICD-10-CM

## 2016-06-24 DIAGNOSIS — K5900 Constipation, unspecified: Secondary | ICD-10-CM | POA: Diagnosis not present

## 2016-06-24 DIAGNOSIS — Z8673 Personal history of transient ischemic attack (TIA), and cerebral infarction without residual deficits: Secondary | ICD-10-CM | POA: Diagnosis not present

## 2016-06-24 DIAGNOSIS — Z8739 Personal history of other diseases of the musculoskeletal system and connective tissue: Secondary | ICD-10-CM | POA: Diagnosis not present

## 2016-06-24 DIAGNOSIS — M79672 Pain in left foot: Secondary | ICD-10-CM | POA: Diagnosis present

## 2016-06-24 DIAGNOSIS — Z89432 Acquired absence of left foot: Secondary | ICD-10-CM | POA: Diagnosis not present

## 2016-06-24 DIAGNOSIS — K219 Gastro-esophageal reflux disease without esophagitis: Secondary | ICD-10-CM | POA: Diagnosis present

## 2016-06-24 LAB — URINALYSIS, ROUTINE W REFLEX MICROSCOPIC
BILIRUBIN URINE: NEGATIVE
GLUCOSE, UA: NEGATIVE mg/dL
HGB URINE DIPSTICK: NEGATIVE
KETONES UR: 5 mg/dL — AB
LEUKOCYTES UA: NEGATIVE
Nitrite: NEGATIVE
PROTEIN: 100 mg/dL — AB
SQUAMOUS EPITHELIAL / LPF: NONE SEEN
Specific Gravity, Urine: 1.025 (ref 1.005–1.030)
pH: 5.5 (ref 5.0–8.0)

## 2016-06-24 LAB — COMPREHENSIVE METABOLIC PANEL
ALT: 15 U/L — ABNORMAL LOW (ref 17–63)
AST: 19 U/L (ref 15–41)
Albumin: 3.5 g/dL (ref 3.5–5.0)
Alkaline Phosphatase: 46 U/L (ref 38–126)
Anion gap: 8 (ref 5–15)
BILIRUBIN TOTAL: 0.1 mg/dL — AB (ref 0.3–1.2)
BUN: 28 mg/dL — ABNORMAL HIGH (ref 6–20)
CHLORIDE: 105 mmol/L (ref 101–111)
CO2: 26 mmol/L (ref 22–32)
CREATININE: 1.42 mg/dL — AB (ref 0.61–1.24)
Calcium: 9.1 mg/dL (ref 8.9–10.3)
GFR, EST NON AFRICAN AMERICAN: 58 mL/min — AB (ref >60)
Glucose, Bld: 227 mg/dL — ABNORMAL HIGH (ref 65–99)
POTASSIUM: 4.5 mmol/L (ref 3.5–5.1)
Sodium: 139 mmol/L (ref 135–145)
TOTAL PROTEIN: 6.7 g/dL (ref 6.5–8.1)

## 2016-06-24 LAB — BASIC METABOLIC PANEL
Anion gap: 8 (ref 5–15)
BUN: 26 mg/dL — AB (ref 6–20)
CALCIUM: 8.9 mg/dL (ref 8.9–10.3)
CO2: 25 mmol/L (ref 22–32)
Chloride: 107 mmol/L (ref 101–111)
Creatinine, Ser: 1.18 mg/dL (ref 0.61–1.24)
GFR calc Af Amer: >60 mL/min (ref >60)
GLUCOSE: 202 mg/dL — AB (ref 65–99)
POTASSIUM: 4.6 mmol/L (ref 3.5–5.1)
SODIUM: 140 mmol/L (ref 135–145)

## 2016-06-24 LAB — LACTIC ACID, PLASMA
LACTIC ACID, VENOUS: 0.8 mmol/L (ref 0.5–1.9)
LACTIC ACID, VENOUS: 2 mmol/L — AB (ref 0.5–1.9)

## 2016-06-24 LAB — GLUCOSE, CAPILLARY
GLUCOSE-CAPILLARY: 271 mg/dL — AB (ref 65–99)
GLUCOSE-CAPILLARY: 188 mg/dL — AB (ref 65–99)
Glucose-Capillary: 231 mg/dL — ABNORMAL HIGH (ref 65–99)

## 2016-06-24 LAB — SEDIMENTATION RATE: Sed Rate: 22 mm/hr — ABNORMAL HIGH (ref 0–16)

## 2016-06-24 LAB — D-DIMER, QUANTITATIVE (NOT AT ARMC): D DIMER QUANT: 0.79 ug{FEU}/mL — AB (ref 0.00–0.50)

## 2016-06-24 LAB — C-REACTIVE PROTEIN: CRP: <0.8 mg/dL (ref <1.0)

## 2016-06-24 MED ORDER — CARVEDILOL 25 MG PO TABS
25.0000 mg | ORAL_TABLET | Freq: Two times a day (BID) | ORAL | Status: DC
  Administered 2016-06-24 – 2016-07-03 (×19): 25 mg via ORAL
  Filled 2016-06-24 (×19): qty 1

## 2016-06-24 MED ORDER — MORPHINE SULFATE (PF) 4 MG/ML IV SOLN
2.0000 mg | INTRAVENOUS | Status: DC | PRN
  Administered 2016-06-24: 2 mg via INTRAVENOUS
  Filled 2016-06-24: qty 1

## 2016-06-24 MED ORDER — PIPERACILLIN-TAZOBACTAM 3.375 G IVPB
3.3750 g | Freq: Three times a day (TID) | INTRAVENOUS | Status: DC
  Administered 2016-06-24 – 2016-06-29 (×15): 3.375 g via INTRAVENOUS
  Filled 2016-06-24 (×17): qty 50

## 2016-06-24 MED ORDER — TRAZODONE HCL 100 MG PO TABS
100.0000 mg | ORAL_TABLET | Freq: Every day | ORAL | Status: DC
Start: 2016-06-24 — End: 2016-07-03
  Administered 2016-06-24 – 2016-07-02 (×9): 100 mg via ORAL
  Filled 2016-06-24 (×9): qty 1

## 2016-06-24 MED ORDER — ASPIRIN EC 81 MG PO TBEC
81.0000 mg | DELAYED_RELEASE_TABLET | Freq: Every day | ORAL | Status: DC
  Administered 2016-06-24 – 2016-07-03 (×10): 81 mg via ORAL
  Filled 2016-06-24 (×10): qty 1

## 2016-06-24 MED ORDER — GABAPENTIN 100 MG PO CAPS
200.0000 mg | ORAL_CAPSULE | Freq: Three times a day (TID) | ORAL | Status: DC
  Administered 2016-06-24 – 2016-07-03 (×28): 200 mg via ORAL
  Filled 2016-06-24 (×28): qty 2

## 2016-06-24 MED ORDER — ENOXAPARIN SODIUM 40 MG/0.4ML ~~LOC~~ SOLN
40.0000 mg | SUBCUTANEOUS | Status: DC
  Administered 2016-06-24 – 2016-06-25 (×2): 40 mg via SUBCUTANEOUS
  Filled 2016-06-24 (×2): qty 0.4

## 2016-06-24 MED ORDER — OXYCODONE-ACETAMINOPHEN 5-325 MG PO TABS
1.0000 | ORAL_TABLET | Freq: Four times a day (QID) | ORAL | Status: DC | PRN
  Administered 2016-06-24: 1 via ORAL
  Administered 2016-06-24 – 2016-06-28 (×8): 2 via ORAL
  Filled 2016-06-24 (×9): qty 2

## 2016-06-24 MED ORDER — SODIUM CHLORIDE 0.9 % IV SOLN
INTRAVENOUS | Status: AC
  Administered 2016-06-24: 05:00:00 via INTRAVENOUS

## 2016-06-24 MED ORDER — INSULIN ASPART 100 UNIT/ML ~~LOC~~ SOLN
0.0000 [IU] | Freq: Three times a day (TID) | SUBCUTANEOUS | Status: DC
  Administered 2016-06-24: 5 [IU] via SUBCUTANEOUS
  Administered 2016-06-24: 2 [IU] via SUBCUTANEOUS
  Administered 2016-06-25 (×2): 3 [IU] via SUBCUTANEOUS
  Administered 2016-06-25: 5 [IU] via SUBCUTANEOUS
  Administered 2016-06-26: 2 [IU] via SUBCUTANEOUS
  Administered 2016-06-26: 3 [IU] via SUBCUTANEOUS
  Administered 2016-06-26: 1 [IU] via SUBCUTANEOUS
  Administered 2016-06-27: 3 [IU] via SUBCUTANEOUS

## 2016-06-24 MED ORDER — ACETAMINOPHEN 650 MG RE SUPP: 650.0000 mg | Freq: Four times a day (QID) | RECTAL | Status: DC | PRN

## 2016-06-24 MED ORDER — ONDANSETRON HCL 4 MG/2ML IJ SOLN: 4.0000 mg | Freq: Four times a day (QID) | INTRAMUSCULAR | Status: DC | PRN

## 2016-06-24 MED ORDER — ONDANSETRON HCL 4 MG PO TABS: 4.0000 mg | ORAL_TABLET | Freq: Four times a day (QID) | ORAL | Status: DC | PRN

## 2016-06-24 MED ORDER — METOCLOPRAMIDE HCL 5 MG PO TABS
5.0000 mg | ORAL_TABLET | Freq: Three times a day (TID) | ORAL | Status: DC
  Administered 2016-06-24 – 2016-07-03 (×28): 5 mg via ORAL
  Filled 2016-06-24 (×27): qty 1

## 2016-06-24 MED ORDER — DOCUSATE SODIUM 100 MG PO CAPS
100.0000 mg | ORAL_CAPSULE | Freq: Two times a day (BID) | ORAL | Status: DC
  Administered 2016-06-24: 100 mg via ORAL
  Filled 2016-06-24: qty 1

## 2016-06-24 MED ORDER — PANTOPRAZOLE SODIUM 40 MG PO TBEC
40.0000 mg | DELAYED_RELEASE_TABLET | Freq: Every day | ORAL | Status: DC
Start: 2016-06-24 — End: 2016-07-03
  Administered 2016-06-24 – 2016-07-03 (×10): 40 mg via ORAL
  Filled 2016-06-24 (×10): qty 1

## 2016-06-24 MED ORDER — INSULIN GLARGINE 100 UNIT/ML ~~LOC~~ SOLN
10.0000 [IU] | Freq: Every day | SUBCUTANEOUS | Status: DC
  Administered 2016-06-24: 10 [IU] via SUBCUTANEOUS
  Filled 2016-06-24 (×2): qty 0.1

## 2016-06-24 MED ORDER — AMLODIPINE BESYLATE 10 MG PO TABS
10.0000 mg | ORAL_TABLET | Freq: Every day | ORAL | Status: DC
Start: 2016-06-24 — End: 2016-07-03
  Administered 2016-06-24 – 2016-07-03 (×10): 10 mg via ORAL
  Filled 2016-06-24 (×10): qty 1

## 2016-06-24 MED ORDER — SENNOSIDES-DOCUSATE SODIUM 8.6-50 MG PO TABS
1.0000 | ORAL_TABLET | Freq: Two times a day (BID) | ORAL | Status: DC
  Filled 2016-06-24 (×2): qty 1

## 2016-06-24 MED ORDER — FLUOXETINE HCL 20 MG PO TABS
20.0000 mg | ORAL_TABLET | Freq: Every day | ORAL | Status: DC
  Administered 2016-06-24: 20 mg via ORAL
  Filled 2016-06-24 (×3): qty 1

## 2016-06-24 MED ORDER — POLYETHYLENE GLYCOL 3350 17 G PO PACK: 17.0000 g | PACK | Freq: Every day | ORAL | Status: DC | PRN

## 2016-06-24 MED ORDER — VANCOMYCIN HCL IN DEXTROSE 1-5 GM/200ML-% IV SOLN
1000.0000 mg | Freq: Two times a day (BID) | INTRAVENOUS | Status: DC
  Administered 2016-06-24 – 2016-06-26 (×5): 1000 mg via INTRAVENOUS
  Filled 2016-06-24 (×6): qty 200

## 2016-06-24 MED ORDER — HYDROMORPHONE HCL 2 MG/ML IJ SOLN
0.5000 mg | Freq: Once | INTRAMUSCULAR | Status: AC
  Administered 2016-06-24: 0.5 mg via INTRAVENOUS

## 2016-06-24 MED ORDER — MORPHINE SULFATE (PF) 4 MG/ML IV SOLN
2.0000 mg | INTRAVENOUS | Status: DC | PRN
  Administered 2016-06-24 – 2016-06-27 (×11): 2 mg via INTRAVENOUS
  Filled 2016-06-24 (×11): qty 1

## 2016-06-24 MED ORDER — ATORVASTATIN CALCIUM 80 MG PO TABS
80.0000 mg | ORAL_TABLET | Freq: Every day | ORAL | Status: DC
  Administered 2016-06-24 – 2016-07-02 (×9): 80 mg via ORAL
  Filled 2016-06-24 (×9): qty 1

## 2016-06-24 MED ORDER — DIVALPROEX SODIUM 500 MG PO DR TAB
1000.0000 mg | DELAYED_RELEASE_TABLET | Freq: Every day | ORAL | Status: DC
  Administered 2016-06-24 – 2016-07-02 (×9): 1000 mg via ORAL
  Filled 2016-06-24 (×9): qty 2

## 2016-06-24 MED ORDER — FAMOTIDINE 20 MG PO TABS
20.0000 mg | ORAL_TABLET | Freq: Every day | ORAL | Status: DC
  Administered 2016-06-24 – 2016-07-03 (×10): 20 mg via ORAL
  Filled 2016-06-24 (×10): qty 1

## 2016-06-24 MED ORDER — SODIUM CHLORIDE 0.9 % IV BOLUS (SEPSIS)
500.0000 mL | Freq: Once | INTRAVENOUS | Status: AC
  Administered 2016-06-24: 500 mL via INTRAVENOUS

## 2016-06-24 MED ORDER — INSULIN GLARGINE 100 UNIT/ML ~~LOC~~ SOLN
6.0000 [IU] | Freq: Every day | SUBCUTANEOUS | Status: DC
  Administered 2016-06-25: 6 [IU] via SUBCUTANEOUS
  Filled 2016-06-24: qty 0.06

## 2016-06-24 MED ORDER — ACETAMINOPHEN 325 MG PO TABS: 650.0000 mg | ORAL_TABLET | Freq: Four times a day (QID) | ORAL | Status: DC | PRN

## 2016-06-24 MED ORDER — GADOBENATE DIMEGLUMINE 529 MG/ML IV SOLN
20.0000 mL | Freq: Once | INTRAVENOUS | Status: AC
  Administered 2016-06-24: 20 mL via INTRAVENOUS

## 2016-06-24 MED ORDER — INSULIN GLARGINE 100 UNIT/ML SOLOSTAR PEN: 10.0000 [IU] | PEN_INJECTOR | SUBCUTANEOUS | Status: DC

## 2016-06-24 NOTE — ED Notes (Signed)
Attempted to call report

## 2016-06-24 NOTE — Progress Notes (Signed)
Subjective: Continues to experience severe LLE pain from his mid calf to foot and toes. Very swollen, tight, erythematous, and warm with patchy skin changes and scars from previous surgeries. Reports that pain persisted since his left 3rd toe digit amputation a few weeks ago and then the erythema spread from site of that surgical incision and swelling over the past few days. He reports resolution of his nausea/vomiting, reports one episode of diarrhea last week but no BMs in 5 days.   Objective: Vital signs in last 24 hours: Vitals:   06/24/16 0700 06/24/16 0730 06/24/16 0745 06/24/16 0900  BP: 111/93 151/87  (!) 176/87  Pulse: 82  84 84  Resp: 19 (!) 31 22 (!) 21  Temp:    97.5 F (36.4 C)  TempSrc:    Oral  SpO2: 98%  98% 99%    Intake/Output Summary (Last 24 hours) at 06/24/16 1055 Last data filed at 06/24/16 1036  Gross per 24 hour  Intake              700 ml  Output              300 ml  Net              400 ml    Physical Exam General appearance: Young-appearing gentleman resting in bed on cell-phone, in no apparent distress HENT: Normocephalic, atraumatic, moist mucous membranes Cardiovascular: Regular rate and rhythm, no murmurs, rubs, gallops Respiratory/Chest: Clear to ausculation bilaterally, normal work of breathing Abdomen: Soft, non-tender, mild distension Extremities: Left foot s/p amputation of 1-3rd toes with sutures and intact incision over previous base of 3rd toe. Tight swelling, discoloration, and tenderness of entirety of foot 3/4ths up the tibia with erythema and warthms. ~2x3cm violaceous area on dorsum of left foot. Unable to palpate DP/PT pulses due to swelling but foot is warm with cap refill <2 sec. Skin: Warm, dry, intact - LLE skin changes per above Psych: Normal affect  Labs / Imaging / Procedures: CBC Latest Ref Rng & Units 06/23/2016 06/13/2016 06/09/2016  WBC 4.0 - 10.5 K/uL 8.8 8.2 7.3  Hemoglobin 13.0 - 17.0 g/dL 9.7(L) 10.3(L) 10.1(L)    Hematocrit 39.0 - 52.0 % 29.7(L) 31.7(L) 31.3(L)  Platelets 150 - 400 K/uL 230 240 216   BMP Latest Ref Rng & Units 06/24/2016 06/23/2016 06/13/2016  Glucose 65 - 99 mg/dL 202(H) 227(H) 193(H)  BUN 6 - 20 mg/dL 26(H) 28(H) 12  Creatinine 0.61 - 1.24 mg/dL 1.18 1.42(H) 0.93  Sodium 135 - 145 mmol/L 140 139 138  Potassium 3.5 - 5.1 mmol/L 4.6 4.5 4.5  Chloride 101 - 111 mmol/L 107 105 104  CO2 22 - 32 mmol/L 25 26 26   Calcium 8.9 - 10.3 mg/dL 8.9 9.1 9.0   Dg Foot Complete Left  Result Date: 06/23/2016 CLINICAL DATA:  Redness and swelling beginning today extending from the left ankle to the midshaft left tibia and fibula. Previous amputation of left toes. History of osteomyelitis. EXAM: LEFT FOOT - COMPLETE 3+ VIEW COMPARISON:  MRI left foot 06/09/2016.  Left foot 06/08/2016. FINDINGS: Postoperative changes with amputations of the first, second, and third toes at the level of the metatarsal phalangeal joint. Amputation of the third toe is new since previous study. Periosteal reaction along the shaft of the second metatarsal is unchanged since prior study and probably represents chronic process. No evidence of acute cortical erosion or sclerosis to suggest residual or recurrent osteomyelitis. No focal bone lesion or bone destruction.  Degenerative changes in the intertarsal joints. Prominent vascular calcifications. IMPRESSION: Amputations of the left first, second, and third toes. No acute bone erosions to suggest residual or recurrent osteomyelitis. Electronically Signed   By: Lucienne Capers M.D.   On: 06/23/2016 23:35   Assessment/Plan:  Shane [name] is a 46 y.o. man with PMH HTN, dCHF, DM2, HLD, anemia, osteomyelitis s/p amputation of left 3rd toe on 06/09/2016 admitted for cellulitis of his left lower leg and foot.  Cellulitis: Patient with rapidly spreading erythema and swelling of left LE two weeks following toe amputation and full course of doxycycline for osteomyelitis. Patient with  subjective fevers and chills, though afebrile here. Site of surgery looks well; there is local violaceous region on dorsum of left foot that is separate from rest of erythema and edema that could be concerning for abscess. Foot XR does not show evidence of recurrent osteomyelitis. Patient does have Well's score of 3, venous doppler prelim read negative for DVT. History of MRSA colonization, also T2DM moderately well controlled. Tight swelling of foot/ankle and severe pain seems out of proportion to cellulitis, concerning for possible underlying abscess or impending compartment syndrome. Patient has scars on the dorsum of both feet from previous incision/fasciotomy/abscess drainage. --Vanc / Zosyn per pharmacy -- Obtaining stat MRI w/wo contrast of foot, ankle, tib/fib -- 500 cc bolus pre-hydration -- Low threshold for contacting Dr. [name] / ortho for concern of compartment syndrome -- Oxycodone-APAP 5-325 mg1-2 tablets Q6H PRN for moderate to severe pain -- Morphine 2mg  IV Q2H PRN for severe pain -- Zofran 4mg  Q6H PRN for nausea -- f/u blood cultures   Constipation, reports no BM in past 5 days, attributes this to pain medications, mild abdominal distension but no overt tenderness -- Senokot-S BID -- MiraLax QD PRN  AKI, resolving. Creatinine1.42 on admission from baseline 1.0 as well as new onset of urgency/difficulty initiating stream likely due to volume depletion. Cr now 1.18 follow IV fluid rehydration. --s/p 500cc NS IV bolus x2; 75cc/hr NS x 4hrs -- Trend BMP  T2DM with gastroparesis and peripheral neuropathy, last A1c 8.7 in 05/2016. Patient on home regimen of metformin 1000mg  BID, glipizide 5mg  daily and Lantus 10U qhs. CBG on admission was 227. Poor po intake recently -- Hold metformin and glipizide -- Lantus 6 units QD starting tomorrow given poor po intake --SSI-S TID wc --continue reglan and gabapentin   HTN, bp up to 176/87 this AM, was normotensive on admission. Patient on  home regimen of amlodipine 10mg  daily, lisinopril 40mg  daily and coreg 12.5mg  BID.  --continue home amlodipine 10mg  daily and coreg 12.5mg  BID --hold lisinopril 40mg  daily in setting of AKI, can restart on d/c  Chronic diastolic heart failure, LVEF 55-60% with grade 2 diastolic dysfunction in [id number]. Home regimen of lisinopril 40mg  daily, coreg 12.5mg  BID; on exam he appears euvolemic. He received 500cc NS bolus for AKI and mild increase in lactic acid that resolved with fluids. --monitor fluid status --s/p 500cc NS IV bolus x2 ; 75cc/hr NS x 4hrs  Bipolar disorder, patient on home regimen of depakote and fluoxetine. --continue depakote 1000mg  daily and fluoxetine 20mg  daily.  Dispo: Anticipated discharge in approximately 2-3 day(s).   LOS: 0 days   Asencion Partridge, MD 06/24/2016, 10:55 AM Pager: [phone]

## 2016-06-24 NOTE — Progress Notes (Signed)
VASCULAR LAB PRELIMINARY  PRELIMINARY  PRELIMINARY  PRELIMINARY  Left lower extremity venous duplex completed.    Preliminary report:  There is no DVT or SVT noted in the left lower extremity. Patient has had 5 additional negative studies since 07/28/15.  Korrie Hofbauer, RVT 06/24/2016, 8:45 AM

## 2016-06-24 NOTE — H&P (Signed)
Date: 06/24/2016               Patient Name:  Shane [name] MRN: [id number]  DOB: 07-12-1970 Age / Sex: 46 y.o., male   PCP: Boykin Nearing, MD         Medical Service: Internal Medicine Teaching Service         Attending Physician: Dr. Annia Belt, MD    First Contact: Dr. Wynetta Emery Pager: [phone]  Second Contact: Dr. Tiburcio Pea Pager: [phone]       After Hours (After 5p/  First Contact Pager: [phone]  weekends / holidays): Second Contact Pager: [phone]   Chief Complaint: left foot pain  History of Present Illness:  Mr. Rathgeber is a 46yo male with PMH of HTN, chronic diastolic CHF, [id number], HLD, bipolar disorder, anemia, and osteomyelitis presenting with left foot pain.   Patient was recently admitted and discharged on 06/10/2016 for osteomyelitis of left 3rd toe s/p amputation and was discharged on additional 7 day course of doxycycline which he was compliant. Patient has had residual pain in his left foot since surgery and has been seen in the ED and by Dr. [name] with ortho since then with no signs of infection or wound dehiscence. Patient states that pain has been progressively worse, especially in the last few days; he denies trauma to the leg or surgery site. In the last day, he has noticed swelling and redness that began at his ankle and spread up to his knee within the day, with significant increase in his pain from previous. He endorses one episode of chills and subjective fever 2 days ago, nausea and vomiting (nonbloody, non bilious; last emesis episode on 1/5), epigastric abdominal discomfort, and one day of diarrhea last week. Patient has had decreased appetite and PO intake. He endorses urinary urgency, decreased volume and trouble beginning a stream in the last couple of days, but denies hematuria, pyuria, flank pain or previous similar circumstances. He endorses some new PND, but no cough, chest pain, shortness of breath on exertion.   He denies melena, hematochezia,  headaches, vision or hearing changes, rashes, or new parasthesias.    Meds:  Current Meds  Medication Sig  . amLODipine (NORVASC) 10 MG tablet Take 1 tablet (10 mg total) by mouth daily.  Marland Kitchen aspirin EC 81 MG tablet Take 1 tablet (81 mg total) by mouth daily.  Marland Kitchen atorvastatin (LIPITOR) 80 MG tablet Take 1 tablet (80 mg total) by mouth every morning. (Patient taking differently: Take 80 mg by mouth daily. )  . carvedilol (COREG) 25 MG tablet Take 1 tablet (25 mg total) by mouth 2 (two) times daily with a meal.  . divalproex (DEPAKOTE) 500 MG DR tablet Take 2 tablets (1,000 mg total) by mouth at bedtime.  Marland Kitchen FLUoxetine (PROZAC) 20 MG tablet Take 1 tablet (20 mg total) by mouth every morning.  . gabapentin (NEURONTIN) 100 MG capsule Take 2 capsules (200 mg total) by mouth 3 (three) times daily.  Marland Kitchen ibuprofen (ADVIL,MOTRIN) 800 MG tablet TAKE 1 TABLET BY MOUTH EVERY 8 HOURS AS NEEDED. (Patient taking differently: TAKE 1 TABLET BY MOUTH EVERY 8 HOURS AS NEEDED FOR PAIN)  . Insulin Glargine (LANTUS SOLOSTAR) 100 UNIT/ML Solostar Pen Inject 13 Units into the skin every morning. (Patient taking differently: Inject 10 Units into the skin every morning. )  . lisinopril (PRINIVIL,ZESTRIL) 40 MG tablet TAKE 1 TABLET BY MOUTH DAILY.  . metFORMIN (GLUCOPHAGE) 1000 MG tablet Take 1 tablet (1,000 mg total)  by mouth 2 (two) times daily with a meal.  . metoCLOPramide (REGLAN) 5 MG tablet Take 1 tablet (5 mg total) by mouth 3 (three) times daily before meals.  . ondansetron (ZOFRAN ODT) 8 MG disintegrating tablet Take 1 tablet (8 mg total) by mouth every 8 (eight) hours as needed for nausea or vomiting.  . pantoprazole (PROTONIX) 40 MG tablet Take 40 mg by mouth daily.   . ranitidine (ZANTAC) 300 MG tablet Take 1 tablet (300 mg total) by mouth at bedtime.  . traZODone (DESYREL) 100 MG tablet Take 1 tablet (100 mg total) by mouth at bedtime.  . triamcinolone ointment (KENALOG) 0.5 % Apply 1 application topically 2  (two) times daily. (Patient taking differently: Apply 1 application topically at bedtime as needed (rash). )  . Vitamin D, Ergocalciferol, (DRISDOL) 50000 units CAPS capsule Take 1 capsule (50,000 Units total) by mouth every 30 (thirty) days. Every 30 days (Patient taking differently: Take 50,000 Units by mouth every 30 (thirty) days. On or about the 1st day of each month)    Allergies: Allergies as of 06/23/2016 - Review Complete 06/23/2016  Allergen Reaction Noted  . Lactose intolerance (gi) Diarrhea and Other (See Comments) 06/08/2016  . Other Other (See Comments) 07/15/2015  . Milk-related compounds Diarrhea and Other (See Comments) 02/19/2015   Past Medical History:  Diagnosis Date  . Anemia   . Bipolar disorder ([place])   . Chest pain    a. 2015 Reportedly normal stress test in [place].  Marland Kitchen Chronic diastolic CHF (congestive heart failure) ([facility])    a.03/2015 Echo: EF 55-60%, Gr 1 DD, mild MR, triv PR.  . Depression   . Depression with anxiety   . Dyspnea    with exertion  . GERD (gastroesophageal reflux disease)   . Hyperlipidemia   . Hypertension    a. 08/2014 Admitted with hypertensive urgency.  . Insomnia   . Internal carotid artery stenosis   . Lower GI bleed 07/04/2015  . Neuropathy ([place])   . Refusal of blood transfusions as patient is Jehovah's Witness   . TIA (transient ischemic attack) 08/2014; 03/2015   a. 08/2014 in setting of hypertensive urgency.  . Type II diabetes mellitus ([place])    started insulin spring 2016, Type II  . Vitamin D deficiency spring 2016    Family History: Mother with heart problems, Father with CVA's  Social History: Denies past or current alcohol, tobacco or drug use  Review of Systems: A complete ROS was negative except as per HPI.   Physical Exam: Blood pressure 146/100, pulse 77, temperature 98 F (36.7 C), temperature source Oral, resp. rate 14, SpO2 99 %. General: alert, well-developed, and cooperative to examination.  Head: normocephalic  and atraumatic.  Eyes: vision grossly intact, pupils equal, pupils round, pupils reactive to light, no injection and anicteric.  Mouth: pharynx pink and moist, no erythema, and no exudates.  Neck: supple, full ROM, no thyromegaly, no JVD.  Lungs: normal respiratory effort, no accessory muscle use, normal breath sounds, no crackles, and no wheezes. Heart: normal rate, regular rhythm, systolic ejection murmur, no gallop, and no rub.  Abdomen: soft, mild epigastric tenderness, normal bowel sounds, mild distention, no guarding, no rebound tenderness, no hepatomegaly, and no splenomegaly.  Msk: no joint swelling, no joint warmth, and no redness over joints. Left foot s/p amputation of 1-3rd toes with sutures and intact incision over 3rd toe. Swelling and tenderness of entirety of foot 3/4ths up the tibia with erythema anterior-laterally between ankle  and knee. ~2x3cm violaceous area on dorsum of left foot. Pulses: 2+ DP/PT pulses bilaterally Extremities: No cyanosis, clubbing;  Neurologic: alert & oriented X3, cranial nerves II-XII grossly intact, strength normal in all extremities, sensation intact to light touch, and gait normal.  Skin: turgor normal and no rashes.  Psych: Oriented X3, memory intact for recent and remote, normally interactive, good eye contact, not anxious appearing, and not depressed appearing  LABS: WBC 8.8, Hgb 9.7 (b/l 10-11), Hct 29.7, Plts 230 Na 139, K 4.5, Cl 105, CO2 24, BUN 28, Cr 1.42, Glu 227 Lactic acid 2.0>1.5 UA small ketones and proteiuria  Foot XR: No acute changes signifying recurrent or remaining osteomyelitis  Assessment & Plan by Problem: Active Problems:   Cellulitis  Cellulitis: Patient with rapidly spreading erythema and swelling of left LE two weeks following toe amputation and full course of doxycycline for osteomyelitis. Patient with subjective fevers and chills, though afebrile here. Site of surgery looks well; there is local violaceous region on  dorsum of left foot that is separate from rest of erythema and edema that could be concerning for abscess. Foot XR does not show evidence of recurrent osteomyelitis. Patient does have Well's score of 3 so we will be pursuing a venous doppler to r/o DVT.  --started on IV vanc in ED - had MRSA positive nasal swab; could probably switch to cefepime tomorrow. --venous doppler u/s --morphine IV PRN for pain; colace BID --f/u blood cultures   AKI: Patient with increase of creatinine to 1.42 from 1 at baseline as well as new onset of urgency/difficulty initiating stream likely due to volume depletion.  --s/p 500cc NS IV bolus; 75cc/hr NS x 4hrs --f/u AM BMP  Nausea: Patient with continued nausea but no vomiting in last couple of days. He does have a history of diabetic gastroparesis. --continue home regimen of reglan 5mg  TID before meals and pepcid 20mg  daily --zofran 4mg  q6hrs PRN   T2DM with gastroparesis and peripheral neuropathy: Patient on home regimen of metformin 1000mg  BID, glipizide 5mg  daily and Lantus 10U qhs. CBG on admission was 227. --Hold metformin and glipizide --Continue Lantus --SSI-S TID wc --continue reglan and gabapentin   HTN: Patient on home regimen of amlodipine 10mg  daily, lisinopril 40mg  daily and coreg 12.5mg  BID. Patient normotensive at admission.  --continue home amlodipine 10mg  daily and coreg 12.5mg  BID --hold lisinopril 40mg  daily in setting of AKI, can restart on d/c  Chronic diastolic heart failure: Patient on home regimen of lisinopril 40mg  daily, coreg 12.5mg  BID; on exam he appears euvolemic. He received 500cc NS bolus for AKI and mild increase in lactic acid that resolved with fluids. --monitor fluid status --s/p 500cc NS IV bolus; 75cc/hr NS x 4hrs  Bipolar disorder: Patient on home regimen of depakote and fluoxetine. --continue depakote 1000mg  daily and fluoxetine 20mg  daily.  DVT: lovenox Diet: Carb Mod IVF: 28ml/hr x 4 hrs (ends at 7AM) Code:  Full - no transfusions   Dispo: Admit patient to Inpatient with expected length of stay greater than 2 midnights.  Signed: Alphonzo Grieve, MD 06/24/2016, 2:59 AM  Pager [phone]

## 2016-06-24 NOTE — ED Notes (Addendum)
Informed PA on critical value.

## 2016-06-24 NOTE — Progress Notes (Signed)
Pharmacy Antibiotic Note  Shane [name] is a 46 y.o. male admitted on 06/23/2016 with cellulitis.  Pharmacy has been consulted for vancomycin and zosyn dosing.  Per MD "Patient with history of poorly controlled T2DM, TIA, CHF, GERD, HTN, HLD, osteomyelitis requiring amputations presents with complaint of left LE redness, pain and swelling. He underwent a 3rd toe amputation on 06/10/16 [name]) and has been doing well post-surgery with localized pain only. Yesterday, the pain increased and extended to proximal lower leg and was associated with mild swelling of the leg and foot, and erythema of foot and leg. He reports tactile fever x 2 days."  Wt 87 kg, creat 1.42>>1.18, Lactate 2>>0.8, AF, given vanc 1 gm in ED 1/7 at 2349.   Plan: Vancomycin 1000 mg IV every 12 hours.  Goal trough 10-15 mcg/mL. Zosyn 3.375g IV q8h (4 hour infusion).  F/u renal fxn, wbc, temp,  Vancomycin levels as needed    Temp (24hrs), Avg:97.8 F (36.6 C), Min:97.5 F (36.4 C), Max:98 F (36.7 C)   Recent Labs Lab 06/23/16 2346 06/24/16 0127 06/24/16 0715  WBC 8.8  --   --   CREATININE 1.42*  --  1.18  LATICACIDVEN 2.0* 0.8  --     Estimated Creatinine Clearance: 81.6 mL/min (by C-G formula based on SCr of 1.18 mg/dL).    Allergies  Allergen Reactions  . Lactose Intolerance (Gi) Diarrhea and Other (See Comments)    bloating  . Other Other (See Comments)    Red meat causes stomach pains, bloating and diarrhea  . Milk-Related Compounds Diarrhea and Other (See Comments)    Any dairy products  - diarrhea and bloating   Eudelia Bunch, Pharm.D. [id number] 06/24/2016 10:01 AM

## 2016-06-25 ENCOUNTER — Telehealth (INDEPENDENT_AMBULATORY_CARE_PROVIDER_SITE_OTHER): Payer: Self-pay | Admitting: *Deleted

## 2016-06-25 DIAGNOSIS — D649 Anemia, unspecified: Secondary | ICD-10-CM

## 2016-06-25 LAB — GLUCOSE, CAPILLARY
GLUCOSE-CAPILLARY: 252 mg/dL — AB (ref 65–99)
Glucose-Capillary: 217 mg/dL — ABNORMAL HIGH (ref 65–99)
Glucose-Capillary: 206 mg/dL — ABNORMAL HIGH (ref 65–99)
Glucose-Capillary: 221 mg/dL — ABNORMAL HIGH (ref 65–99)

## 2016-06-25 LAB — BASIC METABOLIC PANEL
ANION GAP: 6 (ref 5–15)
BUN: 27 mg/dL — ABNORMAL HIGH (ref 6–20)
CALCIUM: 8.4 mg/dL — AB (ref 8.9–10.3)
CO2: 26 mmol/L (ref 22–32)
Chloride: 107 mmol/L (ref 101–111)
Creatinine, Ser: 1.31 mg/dL — ABNORMAL HIGH (ref 0.61–1.24)
GLUCOSE: 237 mg/dL — AB (ref 65–99)
Potassium: 4.5 mmol/L (ref 3.5–5.1)
Sodium: 139 mmol/L (ref 135–145)

## 2016-06-25 LAB — CBC
HCT: 25.5 % — ABNORMAL LOW (ref 39.0–52.0)
HEMOGLOBIN: 8.2 g/dL — AB (ref 13.0–17.0)
MCH: 29.5 pg (ref 26.0–34.0)
MCHC: 32.2 g/dL (ref 30.0–36.0)
MCV: 91.7 fL (ref 78.0–100.0)
PLATELETS: 220 10*3/uL (ref 150–400)
RBC: 2.78 MIL/uL — ABNORMAL LOW (ref 4.22–5.81)
RDW: 14.3 % (ref 11.5–15.5)
WBC: 8.4 10*3/uL (ref 4.0–10.5)

## 2016-06-25 MED ORDER — SENNOSIDES-DOCUSATE SODIUM 8.6-50 MG PO TABS
1.0000 | ORAL_TABLET | Freq: Every evening | ORAL | Status: DC | PRN
  Administered 2016-06-27: 1 via ORAL
  Filled 2016-06-25: qty 1

## 2016-06-25 MED ORDER — FLUOXETINE HCL 20 MG PO CAPS
20.0000 mg | ORAL_CAPSULE | Freq: Every day | ORAL | Status: DC
  Administered 2016-06-25 – 2016-07-03 (×9): 20 mg via ORAL
  Filled 2016-06-25 (×9): qty 1

## 2016-06-25 MED ORDER — INSULIN GLARGINE 100 UNIT/ML ~~LOC~~ SOLN
10.0000 [IU] | Freq: Every day | SUBCUTANEOUS | Status: DC
  Administered 2016-06-26 – 2016-06-27 (×2): 10 [IU] via SUBCUTANEOUS
  Filled 2016-06-25 (×2): qty 0.1

## 2016-06-25 MED ORDER — SODIUM CHLORIDE 0.9 % IV BOLUS (SEPSIS)
500.0000 mL | Freq: Once | INTRAVENOUS | Status: AC
  Administered 2016-06-25: 500 mL via INTRAVENOUS

## 2016-06-25 MED ORDER — SODIUM CHLORIDE 0.9 % IV SOLN
INTRAVENOUS | Status: AC
  Administered 2016-06-25: 18:00:00 via INTRAVENOUS

## 2016-06-25 NOTE — Telephone Encounter (Signed)
Pt called, he is in hosptial at [facility]. Asking if Dr Sharol Given can come by or call pt, pt stated Dr. [name] came by but he thinks the infection is getting worse.  CB: [phone]

## 2016-06-25 NOTE — Progress Notes (Signed)
Inpatient Diabetes Program Recommendations  AACE/ADA: New Consensus Statement on Inpatient Glycemic Control (2015)  Target Ranges:  Prepandial:   less than 140 mg/dL      Peak postprandial:   less than 180 mg/dL (1-2 hours)      Critically ill patients:  140 - 180 mg/dL   Results for DAYNA, EIBEN (MRN [id number]) as of 06/25/2016 11:46  Ref. Range 06/24/2016 11:57 06/24/2016 17:21 06/24/2016 22:24 06/25/2016 07:44  Glucose-Capillary Latest Ref Range: 65 - 99 mg/dL 271 (H) 188 (H) 231 (H) 252 (H)   Review of Glycemic Control  Diabetes history: DM 2 Outpatient Diabetes medications: Lantus 10 units, Metformin 1000 mg BID Current orders for Inpatient glycemic control: Lantus 6 units, Novolog Sensitive TID  Inpatient Diabetes Program Recommendations:   Glucose 250's this am. (Patient received 10 of lantus yesterday, fasting still elevated this am) Please consider increasing Lantus back up to 10 units Daily, give additional 4 units today, and increase correction to Novolog Moderate Correction + HS scale.  Thanks,  Tama Headings RN, MSN, [facility] Inpatient Diabetes Coordinator Team Pager [phone] (8a-5p)

## 2016-06-25 NOTE — Consult Note (Signed)
ORTHOPAEDIC CONSULTATION  REQUESTING PHYSICIAN: Sid Falcon, MD  Chief Complaint: Pain [name] foot and cellulitis [name] leg  HPI: Shane [name] is a 46 y.o. male who presents with increasing pain and cellulitis [name] foot and [name] leg. Patient reports an acute history of pain along the lateral aspect of his [name] foot status post third toe amputation approximately 2 weeks ago.  Past Medical History:  Diagnosis Date  . Anemia   . Bipolar disorder ([place])   . Cellulitis and abscess of [name] leg 06/2016  . Chest pain    a. 2015 Reportedly normal stress test in [place].  Marland Kitchen Chronic diastolic CHF (congestive heart failure) (HCC)    a.03/2015 Echo: EF 55-60%, Gr 1 DD, mild Shane, triv PR.  . Depression   . Depression with anxiety   . Dyspnea    with exertion  . GERD (gastroesophageal reflux disease)   . Hyperlipidemia   . Hypertension    a. 08/2014 Admitted with hypertensive urgency.  . Insomnia   . Internal carotid artery stenosis   . Lower GI bleed 07/04/2015  . Neuropathy ([place])   . Refusal of blood transfusions as patient is Jehovah's Witness   . TIA (transient ischemic attack) 08/2014; 03/2015   a. 08/2014 in setting of hypertensive urgency.  . Type II diabetes mellitus ([place])    started insulin spring 2016, Type II  . Vitamin D deficiency spring 2016   Past Surgical History:  Procedure Laterality Date  . AMPUTATION [name] 08/03/2015   Procedure: AMPUTATION [name] GREAT TOE;  Surgeon: Newt Minion, MD;  Location: [place];  Service: Orthopedics;  Laterality: [name];  . AMPUTATION [name] 12/02/2015   Procedure: amputation of [name] 2nd digit  . AMPUTATION [name] 04/10/2016   Procedure: [name] 2nd Toe Amputation at MTP Joint;  Surgeon: Newt Minion, MD;  Location: [facility];  Service: Orthopedics;  Laterality: [name];  . AMPUTATION [name] 06/09/2016   Procedure: AMPUTATION [name] THIRD TOE;  Surgeon: [name], MD;  Location: [place];  Service: Orthopedics;  Laterality: [name];  . APPLICATION OF WOUND VAC [name]  12/19/2015   Procedure: APPLICATION OF WOUND VAC;  Surgeon: Meredith Pel, MD;  Location: [place];  Service: Orthopedics;  Laterality: [name];  . CIRCUMCISION    . COLONOSCOPY N/A 01/22/2016   Procedure: COLONOSCOPY;  Surgeon: Gatha Mayer, MD;  Location: [place];  Service: Endoscopy;  Laterality: N/A;  . ESOPHAGOGASTRODUODENOSCOPY N/A 01/19/2016   Procedure: ESOPHAGOGASTRODUODENOSCOPY (EGD);  Surgeon: Manus Gunning, MD;  Location: [place];  Service: Gastroenterology;  Laterality: N/A;  . ESOPHAGOGASTRODUODENOSCOPY N/A 01/22/2016   Procedure: ESOPHAGOGASTRODUODENOSCOPY (EGD);  Surgeon: Gatha Mayer, MD;  [facility] END[facility]COPY;  Service: Endoscopy;  Laterality: N/A;  . I&D EXTREMITY [name] 12/02/2015   Procedure: IRRIGATION AND DEBRIDEMENT OF FOOT; [name] SECOND TOE AMPUTATION;  Surgeon: Meredith Pel, MD;  Location: [place];  Service: Orthopedics;  Laterality: [name];  . I&D EXTREMITY [name] 12/19/2015   Procedure: I & D [name] FOOT WITH BEADS ;  Surgeon: Meredith Pel, MD;  Location: [place];  Service: Orthopedics;  Laterality: [name];  . I&D EXTREMITY Right 01/17/2016   Procedure: IRRIGATION AND DEBRIDEMENT RIGHT FOOT;  Surgeon: Newt Minion, MD;  Location: [place];  Service: Orthopedics;  Laterality: Right;  . INCISION AND DRAINAGE FOOT Right 01/17/2016  . INGUINAL HERNIA REPAIR Bilateral ~ 1983- ~ 1986  . TONSILLECTOMY  ~ 5   Social History   Social History  . Marital  status: Married    Spouse name: N/A  . Number of children: N/A  . Years of education: N/A   Social History Main Topics  . Smoking status: Never Smoker  . Smokeless tobacco: Never Used  . Alcohol use No  . Drug use: No  . Sexual activity: No   Other Topics Concern  . None   Social History Narrative   Lives in [place] with wife.  Active but doesn't routinely exercise.   Family History  Problem Relation Age of Onset  . Hypertension Mother   . Diabetes Mother   . Hyperlipidemia Mother   . Heart disease  Mother     s/p pacemaker  . Diabetes Father   . Hypertension Father   . Stroke Father   . Heart attack Father     first MI @ 38.  . Stroke Brother    - negative except otherwise stated in the family history section Allergies  Allergen Reactions  . Lactose Intolerance (Gi) Diarrhea and Other (See Comments)    bloating  . Other Other (See Comments)    Red meat causes stomach pains, bloating and diarrhea  . Milk-Related Compounds Diarrhea and Other (See Comments)    Any dairy products  - diarrhea and bloating   Prior to Admission medications   Medication Sig Start Date End Date Taking? Authorizing Provider  amLODipine (NORVASC) 10 MG tablet Take 1 tablet (10 mg total) by mouth daily. 01/23/16  Yes Hosie Poisson, MD  aspirin EC 81 MG tablet Take 1 tablet (81 mg total) by mouth daily. 03/25/16  Yes Josalyn Funches, MD  atorvastatin (LIPITOR) 80 MG tablet Take 1 tablet (80 mg total) by mouth every morning. Patient taking differently: Take 80 mg by mouth daily.  08/18/15  Yes Josalyn Funches, MD  carvedilol (COREG) 25 MG tablet Take 1 tablet (25 mg total) by mouth 2 (two) times daily with a meal. 06/14/16  Yes Josalyn Funches, MD  divalproex (DEPAKOTE) 500 MG DR tablet Take 2 tablets (1,000 mg total) by mouth at bedtime. 12/04/15  Yes Christina P Rama, MD  FLUoxetine (PROZAC) 20 MG tablet Take 1 tablet (20 mg total) by mouth every morning. 03/25/16  Yes Josalyn Funches, MD  gabapentin (NEURONTIN) 100 MG capsule Take 2 capsules (200 mg total) by mouth 3 (three) times daily. 01/23/16  Yes Hosie Poisson, MD  ibuprofen (ADVIL,MOTRIN) 800 MG tablet TAKE 1 TABLET BY MOUTH EVERY 8 HOURS AS NEEDED. Patient taking differently: TAKE 1 TABLET BY MOUTH EVERY 8 HOURS AS NEEDED FOR PAIN 05/28/16  Yes Josalyn Funches, MD  Insulin Glargine (LANTUS SOLOSTAR) 100 UNIT/ML Solostar Pen Inject 13 Units into the skin every morning. Patient taking differently: Inject 10 Units into the skin every morning.  06/14/16  Yes  Josalyn Funches, MD  lisinopril (PRINIVIL,ZESTRIL) 40 MG tablet TAKE 1 TABLET BY MOUTH DAILY. 05/21/16  Yes Boykin Nearing, MD  metFORMIN (GLUCOPHAGE) 1000 MG tablet Take 1 tablet (1,000 mg total) by mouth 2 (two) times daily with a meal. 12/22/15  Yes Ripudeep K Rai, MD  metoCLOPramide (REGLAN) 5 MG tablet Take 1 tablet (5 mg total) by mouth 3 (three) times daily before meals. 05/30/16  Yes Josalyn Funches, MD  ondansetron (ZOFRAN ODT) 8 MG disintegrating tablet Take 1 tablet (8 mg total) by mouth every 8 (eight) hours as needed for nausea or vomiting. 02/26/16  Yes Josalyn Funches, MD  pantoprazole (PROTONIX) 40 MG tablet Take 40 mg by mouth daily.  04/23/16  Yes Historical Provider, MD  ranitidine (ZANTAC) 300 MG tablet Take 1 tablet (300 mg total) by mouth at bedtime. 06/14/16  Yes Josalyn Funches, MD  traZODone (DESYREL) 100 MG tablet Take 1 tablet (100 mg total) by mouth at bedtime. 02/26/16  Yes Josalyn Funches, MD  triamcinolone ointment (KENALOG) 0.5 % Apply 1 application topically 2 (two) times daily. Patient taking differently: Apply 1 application topically at bedtime as needed (rash).  04/26/16  Yes Josalyn Funches, MD  Vitamin D, Ergocalciferol, (DRISDOL) 50000 units CAPS capsule Take 1 capsule (50,000 Units total) by mouth every 30 (thirty) days. Every 30 days Patient taking differently: Take 50,000 Units by mouth every 30 (thirty) days. On or about the 1st day of each month 04/26/16  Yes Boykin Nearing, MD   Shane [name] W Wo Contrast  Result Date: 06/24/2016 CLINICAL DATA:  History of prior amputations. The most recent was the third toe. Pain, swelling and erythema. EXAM: MRI OF LOWER [name] EXTREMITY WITHOUT AND WITH CONTRAST; MRI OF THE [name] ANKLE WITHOUT AND WITH CONTRAST; MRI OF THE [name] FOREFOOT WITHOUT AND WITH CONTRAST TECHNIQUE: Multiplanar, multisequence Shane imaging of the [name] lower extremity was performed both before and after administration of intravenous contrast. CONTRAST:  20 cc  MultiHance COMPARISON:  Radiographs 06/23/2016 FINDINGS: Extensive subcutaneous soft tissue swelling/ edema/ fluid extending from the [name] knee all way down to the forefoot. No significant findings for myositis involving the calf. There is a 4 cm hematoma noted along the medial aspect of the calf adjacent to the medial gastrocs and soleus muscles. No findings to suggest septic arthritis at the knee or ankle joints. No findings for osteomyelitis involving the tibia or fibula. There is a 13 x 6 mm rim enhancing fluid collection in the region of the recent third toe amputation most consistent with a postoperative abscess. There is also a small fluid collection along the second metatarsal head with some enhancement. Could not exclude a small/ developing abscess. Signal abnormality and enhancement in the second and third metatarsal heads is suspicious for osteomyelitis. Fairly extensive cellulitis and myofasciitis involving the forefoot and midfoot regions. The mid and hindfoot bony structures are intact. Incidental note is made of a chronically torn flexor hallucis longus tendon. IMPRESSION: Severe diffuse cellulitis involving the entire imaged [name] lower extremity. Two small abscesses distal to the second and third metatarsal heads. Suspect osteomyelitis involving the second and third metatarsal heads. Cellulitis and diffuse myofasciitis involving the forefoot and midfoot regions. Electronically Signed   By: Marijo Sanes M.D.   On: 06/24/2016 15:29   Shane Tibia Fibula [name] W Wo Contrast  Result Date: 06/24/2016 CLINICAL DATA:  History of prior amputations. The most recent was the third toe. Pain, swelling and erythema. EXAM: MRI OF LOWER [name] EXTREMITY WITHOUT AND WITH CONTRAST; MRI OF THE [name] ANKLE WITHOUT AND WITH CONTRAST; MRI OF THE [name] FOREFOOT WITHOUT AND WITH CONTRAST TECHNIQUE: Multiplanar, multisequence Shane imaging of the [name] lower extremity was performed both before and after administration of intravenous  contrast. CONTRAST:  20 cc MultiHance COMPARISON:  Radiographs 06/23/2016 FINDINGS: Extensive subcutaneous soft tissue swelling/ edema/ fluid extending from the [name] knee all way down to the forefoot. No significant findings for myositis involving the calf. There is a 4 cm hematoma noted along the medial aspect of the calf adjacent to the medial gastrocs and soleus muscles. No findings to suggest septic arthritis at the knee or ankle joints. No findings for osteomyelitis involving the tibia or fibula. There is a 13 x 6 mm  rim enhancing fluid collection in the region of the recent third toe amputation most consistent with a postoperative abscess. There is also a small fluid collection along the second metatarsal head with some enhancement. Could not exclude a small/ developing abscess. Signal abnormality and enhancement in the second and third metatarsal heads is suspicious for osteomyelitis. Fairly extensive cellulitis and myofasciitis involving the forefoot and midfoot regions. The mid and hindfoot bony structures are intact. Incidental note is made of a chronically torn flexor hallucis longus tendon. IMPRESSION: Severe diffuse cellulitis involving the entire imaged [name] lower extremity. Two small abscesses distal to the second and third metatarsal heads. Suspect osteomyelitis involving the second and third metatarsal heads. Cellulitis and diffuse myofasciitis involving the forefoot and midfoot regions. Electronically Signed   By: Marijo Sanes M.D.   On: 06/24/2016 15:29   Shane Ankle [name] W Wo Contrast  Result Date: 06/24/2016 CLINICAL DATA:  History of prior amputations. The most recent was the third toe. Pain, swelling and erythema. EXAM: MRI OF LOWER [name] EXTREMITY WITHOUT AND WITH CONTRAST; MRI OF THE [name] ANKLE WITHOUT AND WITH CONTRAST; MRI OF THE [name] FOREFOOT WITHOUT AND WITH CONTRAST TECHNIQUE: Multiplanar, multisequence Shane imaging of the [name] lower extremity was performed both before and after  administration of intravenous contrast. CONTRAST:  20 cc MultiHance COMPARISON:  Radiographs 06/23/2016 FINDINGS: Extensive subcutaneous soft tissue swelling/ edema/ fluid extending from the [name] knee all way down to the forefoot. No significant findings for myositis involving the calf. There is a 4 cm hematoma noted along the medial aspect of the calf adjacent to the medial gastrocs and soleus muscles. No findings to suggest septic arthritis at the knee or ankle joints. No findings for osteomyelitis involving the tibia or fibula. There is a 13 x 6 mm rim enhancing fluid collection in the region of the recent third toe amputation most consistent with a postoperative abscess. There is also a small fluid collection along the second metatarsal head with some enhancement. Could not exclude a small/ developing abscess. Signal abnormality and enhancement in the second and third metatarsal heads is suspicious for osteomyelitis. Fairly extensive cellulitis and myofasciitis involving the forefoot and midfoot regions. The mid and hindfoot bony structures are intact. Incidental note is made of a chronically torn flexor hallucis longus tendon. IMPRESSION: Severe diffuse cellulitis involving the entire imaged [name] lower extremity. Two small abscesses distal to the second and third metatarsal heads. Suspect osteomyelitis involving the second and third metatarsal heads. Cellulitis and diffuse myofasciitis involving the forefoot and midfoot regions. Electronically Signed   By: Marijo Sanes M.D.   On: 06/24/2016 15:29   Dg Foot Complete [name]  Result Date: 06/23/2016 CLINICAL DATA:  Redness and swelling beginning today extending from the [name] ankle to the midshaft [name] tibia and fibula. Previous amputation of [name] toes. History of osteomyelitis. EXAM: [name] FOOT - COMPLETE 3+ VIEW COMPARISON:  MRI [name] foot 06/09/2016.  [name] foot 06/08/2016. FINDINGS: Postoperative changes with amputations of the first, second, and third toes at  the level of the metatarsal phalangeal joint. Amputation of the third toe is new since previous study. Periosteal reaction along the shaft of the second metatarsal is unchanged since prior study and probably represents chronic process. No evidence of acute cortical erosion or sclerosis to suggest residual or recurrent osteomyelitis. No focal bone lesion or bone destruction. Degenerative changes in the intertarsal joints. Prominent vascular calcifications. IMPRESSION: Amputations of the [name] first, second, and third toes. No acute bone erosions to suggest  residual or recurrent osteomyelitis. Electronically Signed   By: Lucienne Capers M.D.   On: 06/23/2016 23:35   - pertinent xrays, CT, MRI studies were reviewed and independently interpreted  Positive ROS: All other systems have been reviewed and were otherwise negative with the exception of those mentioned in the HPI and as above.  Physical Exam: General: Alert, no acute distress Psychiatric: Patient is competent for consent with normal mood and affect Lymphatic: No axillary or cervical lymphadenopathy Cardiovascular: No pedal edema Respiratory: No cyanosis, no use of accessory musculature GI: No organomegaly, abdomen is soft and non-tender  Skin: Examination patient has cellulitis which is now proximal to the marked line on his [name] calf his cellulitis extends down to his mid thigh on the [name]. Patient has tenderness to palpation across the [name] forefoot.   Neurologic: Patient does not have protective sensation bilateral lower extremities.   MUSCULOSKELETAL:  Examination patient has swelling in the foot and ankle and leg on the [name] lower extremity. Ultrasound negative for DVT. I cannot palpate a pulse due to the swelling. Patient has tenderness to palpation across the forefoot. There is no purulent drainage there is cellulitis that extends along the plantar aspect of his foot.The thigh and calf are soft nontender to palpation. Patient has no  crepitation no signs or symptoms of necrotizing fasciitis. Review the MRI scan of the leg ankle and foot shows abscess and osteomyelitis of the second and third metatarsal heads [name] foot. Patient has no abscess in the foot and ankle or leg. Patient does have a hematoma in the [name] calf this is. It is not tender to palpation in this does not enhance with contrast.  Assessment: Assessment: Osteomyelitis small focal abscess second and third metatarsal heads [name] foot status post third toe amputation with diabetic insensate neuropathy with progressive cellulitis in the [name] leg and [name] thigh.  Plan: Plan: We'll plan for a [name] transmetatarsal amputation tomorrow. I am unsure why the patient has progressive cellulitis. This should resolve once the source of the infection has been removed.  Thank you for the consult and the opportunity to see Shane. Benjaman Pott, MD [facility] [phone] 9:57 AM

## 2016-06-25 NOTE — Telephone Encounter (Signed)
I called patient he is in room 6n23, recently admitted on Sunday for left foot/leg cellulitis. Seen by Dr. [name], patient requesting consult with Dr. Sharol Given in hospital. I advised patient I would make Dr. Sharol Given aware of this and he would see him, could not give exact time because Dr. Sharol Given is in surgery right now.

## 2016-06-25 NOTE — Care Management Note (Signed)
Case Management Note  Patient Details  Name: Shane [name] MRN: [id number] Date of Birth: 20-Jan-1971  Subjective/Objective:                    Action/Plan:  Plan for left transmetatarsal amputation tomorrow , will follow for home health needs. Patient has Medicaid and non qualifying Medicaid DX for home health PT.   If Home health RN needed will need wound care instructions.    Expected Discharge Date:                  Expected Discharge Plan:     In-House Referral:     Discharge planning Services     Post Acute Care Choice:    Choice offered to:     DME Arranged:    DME Agency:     HH Arranged:    HH Agency:     Status of Service:  In process, will continue to follow  If discussed at Long Length of Stay Meetings, dates discussed:    Additional Comments:  Marilu Favre, RN 06/25/2016, 11:13 AM

## 2016-06-25 NOTE — Progress Notes (Signed)
Subjective: Shane [name] is doing well today, in good spirits. Recently visited by his primary orthopedic surgeon Dr. Sharol Given and stated understanding of the need for surgery tomorrow. Continues to experience pain and rates it 8/10 - advised to use more PRNs which he is requesting sparingly. Swelling in his LLE has improved, erythema has spread above his left knee but shade of redness has greatly improved. Overall leg appears better than on presentation, seems to be responding to antibiotics.  Objective: Vital signs in last 24 hours: Vitals:   06/24/16 0900 06/24/16 2041 06/24/16 2138 06/25/16 0504  BP: (!) 176/87 (!) 99/59 (!) 103/56 117/63  Pulse: 84 74 74 93  Resp: (!) 21 20  18   Temp: 97.5 F (36.4 C) 98.4 F (36.9 C)  99.5 F (37.5 C)  TempSrc: Oral Oral  Oral  SpO2: 99% 98%  93%  Weight:    196 lb 6.9 oz (89.1 kg)    Intake/Output Summary (Last 24 hours) at 06/25/16 0845 Last data filed at 06/25/16 0505  Gross per 24 hour  Intake             1550 ml  Output             1350 ml  Net              200 ml   Physical Exam General appearance: Young-appearing gentleman resting in bed on cell-phone, in no apparent distress HENT: Normocephalic, atraumatic, moist mucous membranes Cardiovascular: Regular rate and rhythm, no murmurs, rubs, gallops Respiratory/Chest: Clear to ausculation bilaterally, normal work of breathing Abdomen: Soft, non-tender, mild distension Extremities: Left foot s/p amputation of 1-3rd toes with sutures and intact incision over previous base of 3rd toe. Tight swelling, discoloration, and tenderness of entirety of foot 3/4ths up the tibia with improved erythema and warthm. Erythema reaches 1/3 way up left thigh. ~2x3cm violaceous area on dorsum of left foot. Unable to palpate DP/PT pulses due to swelling but foot is warm with cap refill <2 sec. Skin: Warm, dry, intact - LLE skin changes per above Psych: Normal affect  Labs / Imaging / Procedures: CBC Latest Ref  Rng & Units 06/23/2016 06/13/2016 06/09/2016  WBC 4.0 - 10.5 K/uL 8.8 8.2 7.3  Hemoglobin 13.0 - 17.0 g/dL 9.7(L) 10.3(L) 10.1(L)  Hematocrit 39.0 - 52.0 % 29.7(L) 31.7(L) 31.3(L)  Platelets 150 - 400 K/uL 230 240 216   BMP Latest Ref Rng & Units 06/24/2016 06/23/2016 06/13/2016  Glucose 65 - 99 mg/dL 202(H) 227(H) 193(H)  BUN 6 - 20 mg/dL 26(H) 28(H) 12  Creatinine 0.61 - 1.24 mg/dL 1.18 1.42(H) 0.93  Sodium 135 - 145 mmol/L 140 139 138  Potassium 3.5 - 5.1 mmol/L 4.6 4.5 4.5  Chloride 101 - 111 mmol/L 107 105 104  CO2 22 - 32 mmol/L 25 26 26   Calcium 8.9 - 10.3 mg/dL 8.9 9.1 9.0   Mr Foot Left W Wo Contrast  Result Date: 06/24/2016 CLINICAL DATA:  History of prior amputations. The most recent was the third toe. Pain, swelling and erythema. EXAM: MRI OF LOWER LEFT EXTREMITY WITHOUT AND WITH CONTRAST; MRI OF THE LEFT ANKLE WITHOUT AND WITH CONTRAST; MRI OF THE LEFT FOREFOOT WITHOUT AND WITH CONTRAST TECHNIQUE: Multiplanar, multisequence MR imaging of the left lower extremity was performed both before and after administration of intravenous contrast. CONTRAST:  20 cc MultiHance COMPARISON:  Radiographs 06/23/2016 FINDINGS: Extensive subcutaneous soft tissue swelling/ edema/ fluid extending from the left knee all way down to the forefoot.  No significant findings for myositis involving the calf. There is a 4 cm hematoma noted along the medial aspect of the calf adjacent to the medial gastrocs and soleus muscles. No findings to suggest septic arthritis at the knee or ankle joints. No findings for osteomyelitis involving the tibia or fibula. There is a 13 x 6 mm rim enhancing fluid collection in the region of the recent third toe amputation most consistent with a postoperative abscess. There is also a small fluid collection along the second metatarsal head with some enhancement. Could not exclude a small/ developing abscess. Signal abnormality and enhancement in the second and third metatarsal heads is  suspicious for osteomyelitis. Fairly extensive cellulitis and myofasciitis involving the forefoot and midfoot regions. The mid and hindfoot bony structures are intact. Incidental note is made of a chronically torn flexor hallucis longus tendon. IMPRESSION: Severe diffuse cellulitis involving the entire imaged left lower extremity. Two small abscesses distal to the second and third metatarsal heads. Suspect osteomyelitis involving the second and third metatarsal heads. Cellulitis and diffuse myofasciitis involving the forefoot and midfoot regions. Electronically Signed   By: Marijo Sanes M.D.   On: 06/24/2016 15:29   Mr Tibia Fibula Left W Wo Contrast  Result Date: 06/24/2016 CLINICAL DATA:  History of prior amputations. The most recent was the third toe. Pain, swelling and erythema. EXAM: MRI OF LOWER LEFT EXTREMITY WITHOUT AND WITH CONTRAST; MRI OF THE LEFT ANKLE WITHOUT AND WITH CONTRAST; MRI OF THE LEFT FOREFOOT WITHOUT AND WITH CONTRAST TECHNIQUE: Multiplanar, multisequence MR imaging of the left lower extremity was performed both before and after administration of intravenous contrast. CONTRAST:  20 cc MultiHance COMPARISON:  Radiographs 06/23/2016 FINDINGS: Extensive subcutaneous soft tissue swelling/ edema/ fluid extending from the left knee all way down to the forefoot. No significant findings for myositis involving the calf. There is a 4 cm hematoma noted along the medial aspect of the calf adjacent to the medial gastrocs and soleus muscles. No findings to suggest septic arthritis at the knee or ankle joints. No findings for osteomyelitis involving the tibia or fibula. There is a 13 x 6 mm rim enhancing fluid collection in the region of the recent third toe amputation most consistent with a postoperative abscess. There is also a small fluid collection along the second metatarsal head with some enhancement. Could not exclude a small/ developing abscess. Signal abnormality and enhancement in the second  and third metatarsal heads is suspicious for osteomyelitis. Fairly extensive cellulitis and myofasciitis involving the forefoot and midfoot regions. The mid and hindfoot bony structures are intact. Incidental note is made of a chronically torn flexor hallucis longus tendon. IMPRESSION: Severe diffuse cellulitis involving the entire imaged left lower extremity. Two small abscesses distal to the second and third metatarsal heads. Suspect osteomyelitis involving the second and third metatarsal heads. Cellulitis and diffuse myofasciitis involving the forefoot and midfoot regions. Electronically Signed   By: Marijo Sanes M.D.   On: 06/24/2016 15:29   Mr Ankle Left W Wo Contrast  Result Date: 06/24/2016 CLINICAL DATA:  History of prior amputations. The most recent was the third toe. Pain, swelling and erythema. EXAM: MRI OF LOWER LEFT EXTREMITY WITHOUT AND WITH CONTRAST; MRI OF THE LEFT ANKLE WITHOUT AND WITH CONTRAST; MRI OF THE LEFT FOREFOOT WITHOUT AND WITH CONTRAST TECHNIQUE: Multiplanar, multisequence MR imaging of the left lower extremity was performed both before and after administration of intravenous contrast. CONTRAST:  20 cc MultiHance COMPARISON:  Radiographs 06/23/2016 FINDINGS: Extensive subcutaneous soft  tissue swelling/ edema/ fluid extending from the left knee all way down to the forefoot. No significant findings for myositis involving the calf. There is a 4 cm hematoma noted along the medial aspect of the calf adjacent to the medial gastrocs and soleus muscles. No findings to suggest septic arthritis at the knee or ankle joints. No findings for osteomyelitis involving the tibia or fibula. There is a 13 x 6 mm rim enhancing fluid collection in the region of the recent third toe amputation most consistent with a postoperative abscess. There is also a small fluid collection along the second metatarsal head with some enhancement. Could not exclude a small/ developing abscess. Signal abnormality and  enhancement in the second and third metatarsal heads is suspicious for osteomyelitis. Fairly extensive cellulitis and myofasciitis involving the forefoot and midfoot regions. The mid and hindfoot bony structures are intact. Incidental note is made of a chronically torn flexor hallucis longus tendon. IMPRESSION: Severe diffuse cellulitis involving the entire imaged left lower extremity. Two small abscesses distal to the second and third metatarsal heads. Suspect osteomyelitis involving the second and third metatarsal heads. Cellulitis and diffuse myofasciitis involving the forefoot and midfoot regions. Electronically Signed   By: Marijo Sanes M.D.   On: 06/24/2016 15:29   Assessment/Plan:  Shane [name] is a 46 y.o. man with PMH HTN, dCHF, DM2, HLD, anemia, osteomyelitis s/p amputation of left 3rd toe on 06/09/2016 admitted for cellulitis of his left lower leg and foot.  Severe LLE cellulitis, suspected osteomyelitis of 2nd and 3rd metatarsal heads Patient with rapidly spreading erythema and swelling of left LE two weeks following toe amputation and full course of doxycycline for osteomyelitis. Patient with subjective fevers and chills, though afebrile here. Site of surgery looks well; there is local violaceous region on dorsum of left foot that is separate from rest of erythema and edema that could be concerning for abscess. Foot XR does not show evidence of recurrent osteomyelitis. Patient does have Well's score of 3, venous doppler prelim read negative for DVT. History of MRSA colonization, also T2DM moderately well controlled. Tight swelling of foot/ankle and severe pain seems out of proportion to cellulitis, concerning for possible underlying abscess or impending compartment syndrome. MRI on 11/8 showed severe cellulitis, myofasciitis, small abscesses adjacent to 2nd/3rd metatarsal heads and changes concerning for osteomyelitis. -- Scheduled for transmetatarsal amputation tomorrow with Dr. Sharol Given, NPO at  midnight, light IV fluids tonight -- Continue Vanc / Zosyn per pharmacy -- [facility] orthopedics consulted, appreciating recs -- Will likely require prolonged antibiotic course, will have ID involved after sugery -- Oxycodone-APAP 5-325 mg1-2 tablets Q6H PRN for moderate to severe pain -- Morphine 2mg  IV Q2H PRN for severe pain -- Zofran 4mg  Q6H PRN for nausea -- f/u blood cultures   T2DM with gastroparesis and peripheral neuropathy, last A1c 8.7 in 05/2016. Patient on home regimen of metformin 1000mg  BID, glipizide 5mg  daily and Lantus 10U qhs. CBG on admission was 227. Poor po intake recently -- Hold metformin and glipizide -- Lantus 6 units QD --SSI-S TID WC --continue reglan and gabapentin   Constipation, resolved, LBM 06/24/16 - had reported no BM 5 days, attributed to pain medications, mild abdominal distension but no overt tenderness -- Senokot-S QHS PRN -- MiraLax QD PRN  AKI, mild. Creatinine1.42 on admission from baseline 1.0 as well as new onset of urgency/difficulty initiating stream likely due to volume depletion. Cr now 1.3 follow IV fluid rehydration, going for surgery tomorrow --500cc NS IV bolus; and mIVF  100 cc/hr for 8 hours -- Trend BMP  Anemia, Hb 8.2 with MCV 92, from 9.7 earlier this admission (baseline around 10). Elevated ferritin in 01/2016. Likely anemia of chronic disease due to recurrent infections and dilutional component this hospital stay from IV fluids - Trend CBC  HTN, resolved with starting home medications. Patient on home regimen of amlodipine 10mg  daily, lisinopril 40mg  daily and coreg 12.5mg  BID.  --continue home amlodipine 10mg  daily and coreg 12.5mg  BID --hold lisinopril 40mg  daily in setting of AKI, can restart on d/c  Chronic diastolic heart failure, LVEF 55-60% with grade 2 diastolic dysfunction in [id number]. Home regimen of lisinopril 40mg  daily, coreg 12.5mg  BID; on exam he appears euvolemic. He received 500cc NS bolus for AKI and mild increase  in lactic acid that resolved with fluids. --monitor fluid status  Bipolar disorder, patient on home regimen of depakote and fluoxetine. --continue depakote 1000mg  daily and fluoxetine 20mg  daily.  Dispo: Anticipated discharge in approximately 2-3 day(s).   LOS: 1 day   Asencion Partridge, MD 06/25/2016, 8:45 AM Pager: [phone]

## 2016-06-25 NOTE — Consult Note (Signed)
Reason for Consult:cellulitis,previous toe amputation Referring Physician: Daryll Drown MD  Shane [name] is an 46 y.o. male.  HPI: patient had left 3rd toe amputation for osteomyelitis on 06/09/16. He has been readmitted with cellulitis up to his tibial tubercle. I saw him on 06/18/16 and foot looked good with no drainage. He was to follow up 2 wks later ( about 1/16). He has developed foot swelling , no drainage with cellulitis up to his tibial tubercle.  On IV ABX.   Past Medical History:  Diagnosis Date  . Anemia   . Bipolar disorder ([place])   . Cellulitis and abscess of left leg 06/2016  . Chest pain    a. 2015 Reportedly normal stress test in [place].  Marland Kitchen Chronic diastolic CHF (congestive heart failure) (HCC)    a.03/2015 Echo: EF 55-60%, Gr 1 DD, mild MR, triv PR.  . Depression   . Depression with anxiety   . Dyspnea    with exertion  . GERD (gastroesophageal reflux disease)   . Hyperlipidemia   . Hypertension    a. 08/2014 Admitted with hypertensive urgency.  . Insomnia   . Internal carotid artery stenosis   . Lower GI bleed 07/04/2015  . Neuropathy ([place])   . Refusal of blood transfusions as patient is Jehovah's Witness   . TIA (transient ischemic attack) 08/2014; 03/2015   a. 08/2014 in setting of hypertensive urgency.  . Type II diabetes mellitus ([place])    started insulin spring 2016, Type II  . Vitamin D deficiency spring 2016    Past Surgical History:  Procedure Laterality Date  . AMPUTATION Left 08/03/2015   Procedure: AMPUTATION LEFT GREAT TOE;  Surgeon: Newt Minion, MD;  Location: [facility];  Service: Orthopedics;  Laterality: Left;  . AMPUTATION Left 12/02/2015   Procedure: amputation of left 2nd digit  . AMPUTATION Left 04/10/2016   Procedure: Left 2nd Toe Amputation at MTP Joint;  Surgeon: Newt Minion, MD;  Location: [place];  Service: Orthopedics;  Laterality: Left;  . AMPUTATION Left 06/09/2016   Procedure: AMPUTATION LEFT THIRD TOE;  Surgeon: [name], MD;  Location: [place];  Service: Orthopedics;  Laterality: Left;  . APPLICATION OF WOUND VAC Left 12/19/2015   Procedure: APPLICATION OF WOUND VAC;  Surgeon: Meredith Pel, MD;  Location: [facility];  Service: Orthopedics;  Laterality: Left;  . CIRCUMCISION    . COLONOSCOPY N/A 01/22/2016   Procedure: COLONOSCOPY;  Surgeon: Gatha Mayer, MD;  Location: [place];  Service: Endoscopy;  Laterality: N/A;  . ESOPHAGOGASTRODUODENOSCOPY N/A 01/19/2016   Procedure: ESOPHAGOGASTRODUODENOSCOPY (EGD);  Surgeon: Manus Gunning, MD;  Location: [facility];  Service: Gastroenterology;  Laterality: N/A;  . ESOPHAGOGASTRODUODENOSCOPY N/A 01/22/2016   Procedure: ESOPHAGOGASTRODUODENOSCOPY (EGD);  Surgeon: Gatha Mayer, MD;  Location: [facility] ENDOSCOPY;  Service: Endoscopy;  Laterality: N/A;  . I&D EXTREMITY Left 12/02/2015   Procedure: IRRIGATION AND DEBRIDEMENT OF FOOT; LEFT SECOND TOE AMPUTATION;  Surgeon: Meredith Pel, MD;  Location: [facility];  Service: Orthopedics;  Laterality: Left;  . I&D EXTREMITY Left 12/19/2015   Procedure: I & D LEFT FOOT WITH BEADS ;  Surgeon: Meredith Pel, MD;  Location: [place];  Service: Orthopedics;  Laterality: Left;  . I&D EXTREMITY Right 01/17/2016   Procedure: IRRIGATION AND DEBRIDEMENT RIGHT FOOT;  Surgeon: Newt Minion, MD;  Location: [place];  Service: Orthopedics;  Laterality: Right;  . INCISION AND DRAINAGE FOOT Right 01/17/2016  . INGUINAL HERNIA REPAIR Bilateral ~ 1983- ~  1986  . TONSILLECTOMY  ~ 1985    Family History  Problem Relation Age of Onset  . Hypertension Mother   . Diabetes Mother   . Hyperlipidemia Mother   . Heart disease Mother     s/p pacemaker  . Diabetes Father   . Hypertension Father   . Stroke Father   . Heart attack Father     first MI @ 16.  . Stroke Brother     Social History:  reports that he has never smoked. He has never used smokeless tobacco. He reports that he does not drink alcohol or use drugs.  Allergies:  Allergies  Allergen  Reactions  . Lactose Intolerance (Gi) Diarrhea and Other (See Comments)    bloating  . Other Other (See Comments)    Red meat causes stomach pains, bloating and diarrhea  . Milk-Related Compounds Diarrhea and Other (See Comments)    Any dairy products  - diarrhea and bloating    Medications: I have reviewed the patient's current medications.  Results for orders placed or performed during the hospital encounter of 06/23/16 (from the past 48 hour(s))  CBC with Differential     Status: Abnormal   Collection Time: 06/23/16 11:46 PM  Result Value Ref Range   WBC 8.8 4.0 - 10.5 K/uL   RBC 3.19 (L) 4.22 - 5.81 MIL/uL   Hemoglobin 9.7 (L) 13.0 - 17.0 g/dL   HCT 29.7 (L) 39.0 - 52.0 %   MCV 93.1 78.0 - 100.0 fL   MCH 30.4 26.0 - 34.0 pg   MCHC 32.7 30.0 - 36.0 g/dL   RDW 14.6 11.5 - 15.5 %   Platelets 230 150 - 400 K/uL   Neutrophils Relative % 67 %   Neutro Abs 5.8 1.7 - 7.7 K/uL   Lymphocytes Relative 24 %   Lymphs Abs 2.1 0.7 - 4.0 K/uL   Monocytes Relative 5 %   Monocytes Absolute 0.5 0.1 - 1.0 K/uL   Eosinophils Relative 4 %   Eosinophils Absolute 0.3 0.0 - 0.7 K/uL   Basophils Relative 0 %   Basophils Absolute 0.0 0.0 - 0.1 K/uL  Comprehensive metabolic panel     Status: Abnormal   Collection Time: 06/23/16 11:46 PM  Result Value Ref Range   Sodium 139 135 - 145 mmol/L   Potassium 4.5 3.5 - 5.1 mmol/L   Chloride 105 101 - 111 mmol/L   CO2 26 22 - 32 mmol/L   Glucose, Bld 227 (H) 65 - 99 mg/dL   BUN 28 (H) 6 - 20 mg/dL   Creatinine, Ser 1.42 (H) 0.61 - 1.24 mg/dL   Calcium 9.1 8.9 - 10.3 mg/dL   Total Protein 6.7 6.5 - 8.1 g/dL   Albumin 3.5 3.5 - 5.0 g/dL   AST 19 15 - 41 U/L   ALT 15 (L) 17 - 63 U/L   Alkaline Phosphatase 46 38 - 126 U/L   Total Bilirubin 0.1 (L) 0.3 - 1.2 mg/dL   GFR calc non Af Amer 58 (L) >60 mL/min   GFR calc Af Amer >60 >60 mL/min    Comment: (NOTE) The eGFR has been calculated using the CKD EPI equation. This calculation has not been  validated in all clinical situations. eGFR's persistently <60 mL/min signify possible Chronic Kidney Disease.    Anion gap 8 5 - 15  Lactic acid, plasma     Status: Abnormal   Collection Time: 06/23/16 11:46 PM  Result Value Ref Range   Lactic  Acid, Venous 2.0 (HH) 0.5 - 1.9 mmol/L    Comment: CRITICAL RESULT CALLED TO, READ BACK BY AND VERIFIED WITH: TOBIAS M,RN 06/24/16 0032 WAYK   Lactic acid, plasma     Status: None   Collection Time: 06/24/16  1:27 AM  Result Value Ref Range   Lactic Acid, Venous 0.8 0.5 - 1.9 mmol/L  Sedimentation rate     Status: Abnormal   Collection Time: 06/24/16  1:27 AM  Result Value Ref Range   Sed Rate 22 (H) 0 - 16 mm/hr  C-reactive protein     Status: None   Collection Time: 06/24/16  1:27 AM  Result Value Ref Range   CRP <0.8 <1.0 mg/dL  D-dimer, quantitative (not at [facility])     Status: Abnormal   Collection Time: 06/24/16  1:27 AM  Result Value Ref Range   D-Dimer, Quant 0.79 (H) 0.00 - 0.50 ug/mL-FEU    Comment: (NOTE) At the manufacturer cut-off of 0.50 ug/mL FEU, this assay has been documented to exclude PE with a sensitivity and negative predictive value of 97 to 99%.  At this time, this assay has not been approved by the FDA to exclude DVT/VTE. Results should be correlated with clinical presentation.   Urinalysis, Routine w reflex microscopic     Status: Abnormal   Collection Time: 06/24/16  5:30 AM  Result Value Ref Range   Color, Urine YELLOW YELLOW   APPearance CLEAR CLEAR   Specific Gravity, Urine 1.025 1.005 - 1.030   pH 5.5 5.0 - 8.0   Glucose, UA NEGATIVE NEGATIVE mg/dL   Hgb urine dipstick NEGATIVE NEGATIVE   Bilirubin Urine NEGATIVE NEGATIVE   Ketones, ur 5 (A) NEGATIVE mg/dL   Protein, ur 100 (A) NEGATIVE mg/dL   Nitrite NEGATIVE NEGATIVE   Leukocytes, UA NEGATIVE NEGATIVE   RBC / HPF 0-5 0 - 5 RBC/hpf   WBC, UA 0-5 0 - 5 WBC/hpf   Bacteria, UA RARE (A) NONE SEEN   Squamous Epithelial / LPF NONE SEEN NONE SEEN    Mucous PRESENT    Hyaline Casts, UA PRESENT    Sperm, UA PRESENT   Basic metabolic panel     Status: Abnormal   Collection Time: 06/24/16  7:15 AM  Result Value Ref Range   Sodium 140 135 - 145 mmol/L   Potassium 4.6 3.5 - 5.1 mmol/L   Chloride 107 101 - 111 mmol/L   CO2 25 22 - 32 mmol/L   Glucose, Bld 202 (H) 65 - 99 mg/dL   BUN 26 (H) 6 - 20 mg/dL   Creatinine, Ser 1.18 0.61 - 1.24 mg/dL   Calcium 8.9 8.9 - 10.3 mg/dL   GFR calc non Af Amer >60 >60 mL/min   GFR calc Af Amer >60 >60 mL/min    Comment: (NOTE) The eGFR has been calculated using the CKD EPI equation. This calculation has not been validated in all clinical situations. eGFR's persistently <60 mL/min signify possible Chronic Kidney Disease.    Anion gap 8 5 - 15  Glucose, capillary     Status: Abnormal   Collection Time: 06/24/16 11:57 AM  Result Value Ref Range   Glucose-Capillary 271 (H) 65 - 99 mg/dL  Glucose, capillary     Status: Abnormal   Collection Time: 06/24/16  5:21 PM  Result Value Ref Range   Glucose-Capillary 188 (H) 65 - 99 mg/dL  Glucose, capillary     Status: Abnormal   Collection Time: 06/24/16 10:24 PM  Result Value  Ref Range   Glucose-Capillary 231 (H) 65 - 99 mg/dL  Glucose, capillary     Status: Abnormal   Collection Time: 06/25/16  7:44 AM  Result Value Ref Range   Glucose-Capillary 252 (H) 65 - 99 mg/dL   Comment 1 Notify RN   Basic metabolic panel     Status: Abnormal   Collection Time: 06/25/16  7:54 AM  Result Value Ref Range   Sodium 139 135 - 145 mmol/L   Potassium 4.5 3.5 - 5.1 mmol/L   Chloride 107 101 - 111 mmol/L   CO2 26 22 - 32 mmol/L   Glucose, Bld 237 (H) 65 - 99 mg/dL   BUN 27 (H) 6 - 20 mg/dL   Creatinine, Ser 1.31 (H) 0.61 - 1.24 mg/dL   Calcium 8.4 (L) 8.9 - 10.3 mg/dL   GFR calc non Af Amer >60 >60 mL/min   GFR calc Af Amer >60 >60 mL/min    Comment: (NOTE) The eGFR has been calculated using the CKD EPI equation. This calculation has not been validated in  all clinical situations. eGFR's persistently <60 mL/min signify possible Chronic Kidney Disease.    Anion gap 6 5 - 15  CBC     Status: Abnormal   Collection Time: 06/25/16  7:54 AM  Result Value Ref Range   WBC 8.4 4.0 - 10.5 K/uL   RBC 2.78 (L) 4.22 - 5.81 MIL/uL   Hemoglobin 8.2 (L) 13.0 - 17.0 g/dL   HCT 25.5 (L) 39.0 - 52.0 %   MCV 91.7 78.0 - 100.0 fL   MCH 29.5 26.0 - 34.0 pg   MCHC 32.2 30.0 - 36.0 g/dL   RDW 14.3 11.5 - 15.5 %   Platelets 220 150 - 400 K/uL  Glucose, capillary     Status: Abnormal   Collection Time: 06/25/16 11:51 AM  Result Value Ref Range   Glucose-Capillary 217 (H) 65 - 99 mg/dL   Comment 1 Notify RN    Comment 2 Document in Chart     Mr Foot Left W Wo Contrast  Result Date: 06/24/2016 CLINICAL DATA:  History of prior amputations. The most recent was the third toe. Pain, swelling and erythema. EXAM: MRI OF LOWER LEFT EXTREMITY WITHOUT AND WITH CONTRAST; MRI OF THE LEFT ANKLE WITHOUT AND WITH CONTRAST; MRI OF THE LEFT FOREFOOT WITHOUT AND WITH CONTRAST TECHNIQUE: Multiplanar, multisequence MR imaging of the left lower extremity was performed both before and after administration of intravenous contrast. CONTRAST:  20 cc MultiHance COMPARISON:  Radiographs 06/23/2016 FINDINGS: Extensive subcutaneous soft tissue swelling/ edema/ fluid extending from the left knee all way down to the forefoot. No significant findings for myositis involving the calf. There is a 4 cm hematoma noted along the medial aspect of the calf adjacent to the medial gastrocs and soleus muscles. No findings to suggest septic arthritis at the knee or ankle joints. No findings for osteomyelitis involving the tibia or fibula. There is a 13 x 6 mm rim enhancing fluid collection in the region of the recent third toe amputation most consistent with a postoperative abscess. There is also a small fluid collection along the second metatarsal head with some enhancement. Could not exclude a small/  developing abscess. Signal abnormality and enhancement in the second and third metatarsal heads is suspicious for osteomyelitis. Fairly extensive cellulitis and myofasciitis involving the forefoot and midfoot regions. The mid and hindfoot bony structures are intact. Incidental note is made of a chronically torn flexor hallucis longus tendon. IMPRESSION:  Severe diffuse cellulitis involving the entire imaged left lower extremity. Two small abscesses distal to the second and third metatarsal heads. Suspect osteomyelitis involving the second and third metatarsal heads. Cellulitis and diffuse myofasciitis involving the forefoot and midfoot regions. Electronically Signed   By: Marijo Sanes M.D.   On: 06/24/2016 15:29   Mr Tibia Fibula Left W Wo Contrast  Result Date: 06/24/2016 CLINICAL DATA:  History of prior amputations. The most recent was the third toe. Pain, swelling and erythema. EXAM: MRI OF LOWER LEFT EXTREMITY WITHOUT AND WITH CONTRAST; MRI OF THE LEFT ANKLE WITHOUT AND WITH CONTRAST; MRI OF THE LEFT FOREFOOT WITHOUT AND WITH CONTRAST TECHNIQUE: Multiplanar, multisequence MR imaging of the left lower extremity was performed both before and after administration of intravenous contrast. CONTRAST:  20 cc MultiHance COMPARISON:  Radiographs 06/23/2016 FINDINGS: Extensive subcutaneous soft tissue swelling/ edema/ fluid extending from the left knee all way down to the forefoot. No significant findings for myositis involving the calf. There is a 4 cm hematoma noted along the medial aspect of the calf adjacent to the medial gastrocs and soleus muscles. No findings to suggest septic arthritis at the knee or ankle joints. No findings for osteomyelitis involving the tibia or fibula. There is a 13 x 6 mm rim enhancing fluid collection in the region of the recent third toe amputation most consistent with a postoperative abscess. There is also a small fluid collection along the second metatarsal head with some enhancement.  Could not exclude a small/ developing abscess. Signal abnormality and enhancement in the second and third metatarsal heads is suspicious for osteomyelitis. Fairly extensive cellulitis and myofasciitis involving the forefoot and midfoot regions. The mid and hindfoot bony structures are intact. Incidental note is made of a chronically torn flexor hallucis longus tendon. IMPRESSION: Severe diffuse cellulitis involving the entire imaged left lower extremity. Two small abscesses distal to the second and third metatarsal heads. Suspect osteomyelitis involving the second and third metatarsal heads. Cellulitis and diffuse myofasciitis involving the forefoot and midfoot regions. Electronically Signed   By: Marijo Sanes M.D.   On: 06/24/2016 15:29   Mr Ankle Left W Wo Contrast  Result Date: 06/24/2016 CLINICAL DATA:  History of prior amputations. The most recent was the third toe. Pain, swelling and erythema. EXAM: MRI OF LOWER LEFT EXTREMITY WITHOUT AND WITH CONTRAST; MRI OF THE LEFT ANKLE WITHOUT AND WITH CONTRAST; MRI OF THE LEFT FOREFOOT WITHOUT AND WITH CONTRAST TECHNIQUE: Multiplanar, multisequence MR imaging of the left lower extremity was performed both before and after administration of intravenous contrast. CONTRAST:  20 cc MultiHance COMPARISON:  Radiographs 06/23/2016 FINDINGS: Extensive subcutaneous soft tissue swelling/ edema/ fluid extending from the left knee all way down to the forefoot. No significant findings for myositis involving the calf. There is a 4 cm hematoma noted along the medial aspect of the calf adjacent to the medial gastrocs and soleus muscles. No findings to suggest septic arthritis at the knee or ankle joints. No findings for osteomyelitis involving the tibia or fibula. There is a 13 x 6 mm rim enhancing fluid collection in the region of the recent third toe amputation most consistent with a postoperative abscess. There is also a small fluid collection along the second metatarsal head  with some enhancement. Could not exclude a small/ developing abscess. Signal abnormality and enhancement in the second and third metatarsal heads is suspicious for osteomyelitis. Fairly extensive cellulitis and myofasciitis involving the forefoot and midfoot regions. The mid and hindfoot bony structures  are intact. Incidental note is made of a chronically torn flexor hallucis longus tendon. IMPRESSION: Severe diffuse cellulitis involving the entire imaged left lower extremity. Two small abscesses distal to the second and third metatarsal heads. Suspect osteomyelitis involving the second and third metatarsal heads. Cellulitis and diffuse myofasciitis involving the forefoot and midfoot regions. Electronically Signed   By: Marijo Sanes M.D.   On: 06/24/2016 15:29   Dg Foot Complete Left  Result Date: 06/23/2016 CLINICAL DATA:  Redness and swelling beginning today extending from the left ankle to the midshaft left tibia and fibula. Previous amputation of left toes. History of osteomyelitis. EXAM: LEFT FOOT - COMPLETE 3+ VIEW COMPARISON:  MRI left foot 06/09/2016.  Left foot 06/08/2016. FINDINGS: Postoperative changes with amputations of the first, second, and third toes at the level of the metatarsal phalangeal joint. Amputation of the third toe is new since previous study. Periosteal reaction along the shaft of the second metatarsal is unchanged since prior study and probably represents chronic process. No evidence of acute cortical erosion or sclerosis to suggest residual or recurrent osteomyelitis. No focal bone lesion or bone destruction. Degenerative changes in the intertarsal joints. Prominent vascular calcifications. IMPRESSION: Amputations of the left first, second, and third toes. No acute bone erosions to suggest residual or recurrent osteomyelitis. Electronically Signed   By: Lucienne Capers M.D.   On: 06/23/2016 23:35    ROS updated and unchange from last admission except as noted with  cellulitis. Blood pressure 130/75, pulse 93, temperature 99.5 F (37.5 C), temperature source Oral, resp. rate 18, weight 196 lb 6.9 oz (89.1 kg), SpO2 93 %. Physical Exam  Constitutional: He appears well-developed and well-nourished.  HENT:  Head: Normocephalic.  Eyes: Pupils are equal, round, and reactive to light.  Neck: Normal range of motion.  Cardiovascular: Normal rate.   Respiratory: Effort normal and breath sounds normal. He has no wheezes.  GI: Soft.  Neurological:  Decreased sensation bilat stocking distribution. Amputated great toe, second and now 3rd toe. Sutures intact. Swelling of foot , ankle with cellulitis up to tibial tubercle.   No drainage. Unable to express fluid. Knee and ankle ROM without pain. Normal hip ROM    Assessment/Plan: xrays show no osteomyelitis, MRI suggestive but not conclusive for possible osteo 2nd and 3rd MT head.  Agree with IV ABX , options discussed with patient. With a trans met amputation he would be limited in ambulation distances. We discussed BKA and he understands this may be his best option. Will follow closely with you.   My cell [phone].   ( surgery time available Friday if he does not improve. )  I discussed with him that I would go with IV ABX and then restart doxy as he did before with  The possibility that there is no osteo and he could avoid more proximal surgery. I will check him daily this week.   [name] 06/25/2016, 12:09 PM

## 2016-06-25 NOTE — Progress Notes (Signed)
  Date: 06/25/2016  Patient name: Shane [name]  Medical record number: [id number]  Date of birth: 16-May-1971   I have personally seen and evaluated this patient and I have discussed the plan of care with the house staff. Please see Dr. Durenda Age note for complete details. I concur with his findings.  Erythema on leg improved, but some redness extending to upper thigh today.  Have reviewed orthopedic notes and plan for surgery tomorrow.  Will need to define appropriate course of Abx after surgery given likely osteomyelitis as seen on MRI.  We discussed these plans with the patient this morning.    Sid Falcon, MD 06/25/2016, 5:00 PM

## 2016-06-26 ENCOUNTER — Inpatient Hospital Stay (HOSPITAL_COMMUNITY): Payer: Medicaid Other | Admitting: Certified Registered"

## 2016-06-26 ENCOUNTER — Encounter (HOSPITAL_COMMUNITY): Payer: Self-pay | Admitting: Certified Registered"

## 2016-06-26 ENCOUNTER — Encounter (HOSPITAL_COMMUNITY)
Admission: EM | Disposition: A | Discharge status: Skilled Nursing Facility | Payer: Self-pay | Source: Home / Self Care | Attending: Internal Medicine

## 2016-06-26 ENCOUNTER — Other Ambulatory Visit (INDEPENDENT_AMBULATORY_CARE_PROVIDER_SITE_OTHER): Payer: Self-pay | Admitting: Family

## 2016-06-26 DIAGNOSIS — M86272 Subacute osteomyelitis, left ankle and foot: Secondary | ICD-10-CM

## 2016-06-26 DIAGNOSIS — L03116 Cellulitis of left lower limb: Secondary | ICD-10-CM

## 2016-06-26 HISTORY — PX: AMPUTATION: SHX166

## 2016-06-26 LAB — GLUCOSE, CAPILLARY
GLUCOSE-CAPILLARY: 194 mg/dL — AB (ref 65–99)
Glucose-Capillary: 238 mg/dL — ABNORMAL HIGH (ref 65–99)
Glucose-Capillary: 185 mg/dL — ABNORMAL HIGH (ref 65–99)
Glucose-Capillary: 148 mg/dL — ABNORMAL HIGH (ref 65–99)
Glucose-Capillary: 190 mg/dL — ABNORMAL HIGH (ref 65–99)

## 2016-06-26 LAB — CBC
HEMATOCRIT: 20.1 % — AB (ref 39.0–52.0)
HEMATOCRIT: 21.4 % — AB (ref 39.0–52.0)
HEMOGLOBIN: 6.7 g/dL — AB (ref 13.0–17.0)
HEMOGLOBIN: 7.1 g/dL — AB (ref 13.0–17.0)
MCH: 30.9 pg (ref 26.0–34.0)
MCH: 30.5 pg (ref 26.0–34.0)
MCHC: 33.3 g/dL (ref 30.0–36.0)
MCHC: 33.2 g/dL (ref 30.0–36.0)
MCV: 92.6 fL (ref 78.0–100.0)
MCV: 91.8 fL (ref 78.0–100.0)
Platelets: 163 10*3/uL (ref 150–400)
Platelets: 192 10*3/uL (ref 150–400)
RBC: 2.17 MIL/uL — ABNORMAL LOW (ref 4.22–5.81)
RBC: 2.33 MIL/uL — ABNORMAL LOW (ref 4.22–5.81)
RDW: 15 % (ref 11.5–15.5)
RDW: 14.7 % (ref 11.5–15.5)
WBC: 8.4 10*3/uL (ref 4.0–10.5)
WBC: 8.8 10*3/uL (ref 4.0–10.5)

## 2016-06-26 LAB — BASIC METABOLIC PANEL
Anion gap: 5 (ref 5–15)
BUN: 31 mg/dL — AB (ref 6–20)
CHLORIDE: 107 mmol/L (ref 101–111)
CO2: 24 mmol/L (ref 22–32)
Calcium: 7.8 mg/dL — ABNORMAL LOW (ref 8.9–10.3)
Creatinine, Ser: 1.6 mg/dL — ABNORMAL HIGH (ref 0.61–1.24)
GFR calc Af Amer: 58 mL/min — ABNORMAL LOW (ref >60)
GFR, EST NON AFRICAN AMERICAN: 50 mL/min — AB (ref >60)
GLUCOSE: 234 mg/dL — AB (ref 65–99)
POTASSIUM: 5 mmol/L (ref 3.5–5.1)
Sodium: 136 mmol/L (ref 135–145)

## 2016-06-26 LAB — SURGICAL PCR SCREEN
MRSA, PCR: NEGATIVE
Staphylococcus aureus: POSITIVE — AB

## 2016-06-26 SURGERY — AMPUTATION, FOOT, PARTIAL
Anesthesia: General | Site: Foot | Laterality: Left

## 2016-06-26 MED ORDER — TRANEXAMIC ACID 1000 MG/10ML IV SOLN
1000.0000 mg | INTRAVENOUS | Status: DC
  Filled 2016-06-26: qty 10

## 2016-06-26 MED ORDER — HYDROMORPHONE HCL 2 MG/ML IJ SOLN: 1.0000 mg | INTRAMUSCULAR | Status: DC | PRN

## 2016-06-26 MED ORDER — ONDANSETRON HCL 4 MG/2ML IJ SOLN
INTRAMUSCULAR | Status: DC | PRN
  Administered 2016-06-26: 4 mg via INTRAVENOUS

## 2016-06-26 MED ORDER — PHENYLEPHRINE 40 MCG/ML (10ML) SYRINGE FOR IV PUSH (FOR BLOOD PRESSURE SUPPORT)
PREFILLED_SYRINGE | INTRAVENOUS | Status: AC
  Filled 2016-06-26: qty 10

## 2016-06-26 MED ORDER — ONDANSETRON HCL 4 MG/2ML IJ SOLN
4.0000 mg | Freq: Four times a day (QID) | INTRAMUSCULAR | Status: DC | PRN
  Administered 2016-06-28 – 2016-06-29 (×2): 4 mg via INTRAVENOUS
  Filled 2016-06-26 (×2): qty 2

## 2016-06-26 MED ORDER — VANCOMYCIN HCL 10 G IV SOLR
1500.0000 mg | INTRAVENOUS | Status: DC
  Administered 2016-06-27: 1500 mg via INTRAVENOUS
  Filled 2016-06-26 (×2): qty 1500

## 2016-06-26 MED ORDER — EPHEDRINE 5 MG/ML INJ
INTRAVENOUS | Status: AC
  Filled 2016-06-26: qty 10

## 2016-06-26 MED ORDER — FENTANYL CITRATE (PF) 100 MCG/2ML IJ SOLN
INTRAMUSCULAR | Status: DC | PRN
  Administered 2016-06-26: 50 ug via INTRAVENOUS
  Administered 2016-06-26 (×2): 25 ug via INTRAVENOUS

## 2016-06-26 MED ORDER — HYDROMORPHONE HCL 1 MG/ML IJ SOLN
0.2500 mg | INTRAMUSCULAR | Status: DC | PRN
  Administered 2016-06-26 (×3): 0.5 mg via INTRAVENOUS

## 2016-06-26 MED ORDER — HYDROMORPHONE HCL 1 MG/ML IJ SOLN
INTRAMUSCULAR | Status: AC
  Filled 2016-06-26: qty 1

## 2016-06-26 MED ORDER — LIDOCAINE 2% (20 MG/ML) 5 ML SYRINGE
INTRAMUSCULAR | Status: DC | PRN
  Administered 2016-06-26: 25 mg via INTRAVENOUS
  Administered 2016-06-26: 60 mg via INTRAVENOUS

## 2016-06-26 MED ORDER — ROCURONIUM BROMIDE 50 MG/5ML IV SOSY
PREFILLED_SYRINGE | INTRAVENOUS | Status: AC
Start: 2016-06-26 — End: 2016-06-26
  Filled 2016-06-26: qty 5

## 2016-06-26 MED ORDER — 0.9 % SODIUM CHLORIDE (POUR BTL) OPTIME
TOPICAL | Status: DC | PRN
  Administered 2016-06-26: 1000 mL

## 2016-06-26 MED ORDER — PROPOFOL 10 MG/ML IV BOLUS
INTRAVENOUS | Status: DC | PRN
  Administered 2016-06-26: 160 mg via INTRAVENOUS

## 2016-06-26 MED ORDER — METOCLOPRAMIDE HCL 5 MG PO TABS: 5.0000 mg | ORAL_TABLET | Freq: Three times a day (TID) | ORAL | Status: DC | PRN

## 2016-06-26 MED ORDER — FENTANYL CITRATE (PF) 100 MCG/2ML IJ SOLN
INTRAMUSCULAR | Status: AC
  Filled 2016-06-26: qty 2

## 2016-06-26 MED ORDER — TRANEXAMIC ACID 1000 MG/10ML IV SOLN
INTRAVENOUS | Status: DC | PRN
  Administered 2016-06-26: 2000 mg via TOPICAL

## 2016-06-26 MED ORDER — HYDROMORPHONE HCL 1 MG/ML IJ SOLN
INTRAMUSCULAR | Status: AC
  Filled 2016-06-26: qty 0.5

## 2016-06-26 MED ORDER — SODIUM CHLORIDE 0.9 % IV SOLN
2000.0000 mg | Freq: Once | INTRAVENOUS | Status: DC
  Filled 2016-06-26: qty 20

## 2016-06-26 MED ORDER — ONDANSETRON HCL 4 MG PO TABS: 4.0000 mg | ORAL_TABLET | Freq: Four times a day (QID) | ORAL | Status: DC | PRN

## 2016-06-26 MED ORDER — ACETAMINOPHEN 325 MG PO TABS
650.0000 mg | ORAL_TABLET | Freq: Four times a day (QID) | ORAL | Status: DC | PRN
  Administered 2016-06-27 – 2016-07-03 (×5): 650 mg via ORAL
  Filled 2016-06-26 (×5): qty 2

## 2016-06-26 MED ORDER — METOCLOPRAMIDE HCL 5 MG/ML IJ SOLN: 5.0000 mg | Freq: Three times a day (TID) | INTRAMUSCULAR | Status: DC | PRN

## 2016-06-26 MED ORDER — LACTATED RINGERS IV SOLN
INTRAVENOUS | Status: DC
  Administered 2016-06-26: 09:00:00 via INTRAVENOUS

## 2016-06-26 MED ORDER — METHOCARBAMOL 1000 MG/10ML IJ SOLN: 500.0000 mg | Freq: Four times a day (QID) | INTRAVENOUS | Status: DC | PRN

## 2016-06-26 MED ORDER — METHOCARBAMOL 500 MG PO TABS
500.0000 mg | ORAL_TABLET | Freq: Four times a day (QID) | ORAL | Status: DC | PRN
  Administered 2016-06-26 – 2016-07-03 (×7): 500 mg via ORAL
  Filled 2016-06-26 (×7): qty 1

## 2016-06-26 MED ORDER — LIDOCAINE 2% (20 MG/ML) 5 ML SYRINGE
INTRAMUSCULAR | Status: AC
  Filled 2016-06-26: qty 5

## 2016-06-26 MED ORDER — ACETAMINOPHEN 650 MG RE SUPP: 650.0000 mg | Freq: Four times a day (QID) | RECTAL | Status: DC | PRN

## 2016-06-26 MED ORDER — SODIUM CHLORIDE 0.9 % IV SOLN
INTRAVENOUS | Status: DC
  Administered 2016-06-26: 12:00:00 via INTRAVENOUS

## 2016-06-26 MED ORDER — PROPOFOL 10 MG/ML IV BOLUS
INTRAVENOUS | Status: AC
Start: 2016-06-26 — End: 2016-06-26
  Filled 2016-06-26: qty 20

## 2016-06-26 SURGICAL SUPPLY — 35 items
BAG DECANTER FOR FLEXI CONT (MISCELLANEOUS) ×3 IMPLANT
BENZOIN TINCTURE PRP APPL 2/3 (GAUZE/BANDAGES/DRESSINGS) IMPLANT
BLADE SAW SGTL HD 18.5X60.5X1. (BLADE) ×3 IMPLANT
BLADE SURG 21 STRL SS (BLADE) ×3 IMPLANT
BNDG COHESIVE 4X5 TAN STRL (GAUZE/BANDAGES/DRESSINGS) IMPLANT
BNDG GAUZE ELAST 4 BULKY (GAUZE/BANDAGES/DRESSINGS) IMPLANT
COVER SURGICAL LIGHT HANDLE (MISCELLANEOUS) ×3 IMPLANT
DRAPE INCISE IOBAN 66X45 STRL (DRAPES) ×3 IMPLANT
DRAPE U-SHAPE 47X51 STRL (DRAPES) ×3 IMPLANT
DRSG ADAPTIC 3X8 NADH LF (GAUZE/BANDAGES/DRESSINGS) IMPLANT
DRSG PAD ABDOMINAL 8X10 ST (GAUZE/BANDAGES/DRESSINGS) IMPLANT
DURAPREP 26ML APPLICATOR (WOUND CARE) ×3 IMPLANT
ELECT REM PT RETURN 9FT ADLT (ELECTROSURGICAL) ×3
ELECTRODE REM PT RTRN 9FT ADLT (ELECTROSURGICAL) ×1 IMPLANT
GAUZE SPONGE 4X4 12PLY STRL (GAUZE/BANDAGES/DRESSINGS) IMPLANT
GLOVE BIOGEL PI IND STRL 9 (GLOVE) ×1 IMPLANT
GLOVE BIOGEL PI INDICATOR 9 (GLOVE) ×2
GLOVE SURG ORTHO 9.0 STRL STRW (GLOVE) ×3 IMPLANT
GOWN STRL REUS W/ TWL XL LVL3 (GOWN DISPOSABLE) ×3 IMPLANT
GOWN STRL REUS W/TWL XL LVL3 (GOWN DISPOSABLE) ×6
KIT BASIN OR (CUSTOM PROCEDURE TRAY) ×3 IMPLANT
KIT ROOM TURNOVER OR (KITS) ×3 IMPLANT
NS IRRIG 1000ML POUR BTL (IV SOLUTION) ×3 IMPLANT
PACK ORTHO EXTREMITY (CUSTOM PROCEDURE TRAY) ×3 IMPLANT
PAD ARMBOARD 7.5X6 YLW CONV (MISCELLANEOUS) ×6 IMPLANT
PREVENA INCISION MGT 90 150 (MISCELLANEOUS) ×3 IMPLANT
SPONGE LAP 18X18 X RAY DECT (DISPOSABLE) IMPLANT
SUT ETHILON 2 0 PSLX (SUTURE) ×6 IMPLANT
SUT VIC AB 2-0 CTB1 (SUTURE) IMPLANT
TOWEL OR 17X24 6PK STRL BLUE (TOWEL DISPOSABLE) ×3 IMPLANT
TOWEL OR 17X26 10 PK STRL BLUE (TOWEL DISPOSABLE) ×3 IMPLANT
TUBE CONNECTING 12'X1/4 (SUCTIONS) ×1
TUBE CONNECTING 12X1/4 (SUCTIONS) ×2 IMPLANT
WATER STERILE IRR 1000ML POUR (IV SOLUTION) ×3 IMPLANT
YANKAUER SUCT BULB TIP NO VENT (SUCTIONS) ×3 IMPLANT

## 2016-06-26 NOTE — Progress Notes (Signed)
Patient ID: Shane [name], male   DOB: 1970-12-16, 46 y.o.   MRN: [id number] Medical service had called me and also Dr. Sharol Given. Both of [place] have seen patient in the past for toe amputation. I will sign off.

## 2016-06-26 NOTE — Anesthesia Procedure Notes (Signed)
Procedure Name: LMA Insertion Date/Time: 06/26/2016 9:02 AM Performed by: Myna Bright Pre-anesthesia Checklist: Patient identified, Emergency Drugs available, Suction available and Patient being monitored Patient Re-evaluated:Patient Re-evaluated prior to inductionOxygen Delivery Method: Circle system utilized Preoxygenation: Pre-oxygenation with 100% oxygen Intubation Type: IV induction Ventilation: Mask ventilation without difficulty LMA: LMA inserted LMA Size: 4.0 Tube type: Oral Number of attempts: 1 Placement Confirmation: positive ETCO2 and breath sounds checked- equal and bilateral Tube secured with: Tape Dental Injury: Teeth and Oropharynx as per pre-operative assessment

## 2016-06-26 NOTE — Progress Notes (Signed)
Report given to Dannielle Burn RN short stay. Patient going to surgery. OR and short stay aware order for surgery and consent was not in computer yet.

## 2016-06-26 NOTE — Care Management (Signed)
left transmetatarsal amputation today  , will follow for home health needs. Patient has Medicaid and non qualifying Medicaid DX for home health PT.    Prevena wound VAC .  If home IV ABX needed will need prescription , following MD, and home health RN order and face to face.  Thanks   Magdalen Spatz RN BSN [phone]

## 2016-06-26 NOTE — Anesthesia Postprocedure Evaluation (Signed)
Anesthesia Post Note  Patient: Shane [name]  Procedure(s) Performed: Procedure(s) (LRB): Left Foot Transmetatarsal Amputation (Left)  Patient location during evaluation: PACU Anesthesia Type: General Level of consciousness: awake and alert Pain management: pain level controlled Vital Signs Assessment: post-procedure vital signs reviewed and stable Respiratory status: spontaneous breathing, nonlabored ventilation, respiratory function stable and patient connected to nasal cannula oxygen Cardiovascular status: blood pressure returned to baseline and stable Postop Assessment: no signs of nausea or vomiting Anesthetic complications: no       Last Vitals:  Vitals:   06/26/16 1030 06/26/16 1047  BP:  130/69  Pulse: 88 92  Resp: 13 16  Temp: 37.5 C 37.3 C    Last Pain:  Vitals:   06/26/16 1047  TempSrc: Oral  PainSc:                  Breeona Waid,W. EDMOND

## 2016-06-26 NOTE — Progress Notes (Signed)
Orthopedic Tech Progress Note Patient Details:  Shane [name] [birth year] [id number]  Ortho Devices Type of Ortho Device: Postop shoe/boot Ortho Device/Splint Location: lle Ortho Device/Splint Interventions: Application   Shane [name] 06/26/2016, 10:32 AM

## 2016-06-26 NOTE — Transfer of Care (Signed)
Immediate Anesthesia Transfer of Care Note  Patient: Shane [name]  Procedure(s) Performed: Procedure(s): Left Foot Transmetatarsal Amputation (Left)  Patient Location: PACU  Anesthesia Type:General  Level of Consciousness: sedated and responds to stimulation  Airway & Oxygen Therapy: Patient Spontanous Breathing and Patient connected to nasal cannula oxygen  Post-op Assessment: Report given to RN and Post -op Vital signs reviewed and stable  Post vital signs: Reviewed and stable  Last Vitals:  Vitals:   06/26/16 0613 06/26/16 0806  BP: (!) 147/74 137/67  Pulse: (!) 110 (!) 106  Resp:  18  Temp: (!) 38.2 C 37.8 C    Last Pain:  Vitals:   06/26/16 0806  TempSrc: Oral  PainSc:       Patients Stated Pain Goal: 2 ([id number])  Complications: No apparent anesthesia complications

## 2016-06-26 NOTE — Progress Notes (Signed)
Pharmacy Antibiotic Note  Shane [name] is a 46 y.o. male admitted on 06/23/2016 with cellulitis.  Pharmacy has been consulted for vancomycin and zosyn dosing.  S/p trans-met amputation.  Creatinine 1.31>>1.5.  WBC WNL, Tmax 100.8. MRSA PCR neg, MSSA PCR pos.  Plan: Change vanc to 1500 mg IV q24 due to incr creat,  Goal trough 10-15 mcg/mL. Zosyn 3.375g IV q8h (4 hour infusion).  F/u renal fxn, wbc, temp,  Vancomycin levels as needed S/p trans met amputation 1/10 Per Ortho notes 1/9 plan to dc home on Doxy as before  Weight: 203 lb 7.8 oz (92.3 kg)  Temp (24hrs), Avg:99.4 F (37.4 C), Min:98.4 F (36.9 C), Max:100.8 F (38.2 C)   Recent Labs Lab 06/23/16 2346 06/24/16 0127 06/24/16 0715 06/25/16 0754 06/26/16 0331 06/26/16 0601  WBC 8.8  --   --  8.4 8.4 8.8  CREATININE 1.42*  --  1.18 1.31* 1.60*  --   LATICACIDVEN 2.0* 0.8  --   --   --   --     Estimated Creatinine Clearance: 61.8 mL/min (by C-G formula based on SCr of 1.6 mg/dL (H)).    Allergies  Allergen Reactions  . Lactose Intolerance (Gi) Diarrhea and Other (See Comments)    bloating  . Other Other (See Comments)    Red meat causes stomach pains, bloating and diarrhea  . Milk-Related Compounds Diarrhea and Other (See Comments)    Any dairy products  - diarrhea and bloating   Antimicrobials this admission:  Vanc 1/8>> Zosyn 1/8>>  Dose adjustments this admission:  1/10 vanc dose change  Microbiology results:  1/8 BC x2>>ngtd 1/10 MRSA pcr neg, MSSA pos  Eudelia Bunch, Pharm.D. [phone] 06/26/2016 2:12 PM

## 2016-06-26 NOTE — Progress Notes (Signed)
CRITICAL VALUE ALERT  Critical value received:Hbg=6.7  Date of notification:06-26-16  Time of notification:  0404  Critical value read back:yes  Nurse who received alert:Deshawna Mcneece,RN  MD notified (1st page):   Time of first page: [phone]  MD notified (2nd page):  Time of second page:  Responding MD:  Alphonzo Grieve, MD  Time MD responded:  [phone]

## 2016-06-26 NOTE — Interval H&P Note (Signed)
History and Physical Interval Note:  06/26/2016 7:36 AM  Shane [name]  has presented today for surgery, with the diagnosis of Left Foot Osteomyelitis and Abscess  The various methods of treatment have been discussed with the patient and family. After consideration of risks, benefits and other options for treatment, the patient has consented to  Procedure(s): Left Foot Transmetatarsal Amputation (Left) as a surgical intervention .  The patient's history has been reviewed, patient examined, no change in status, stable for surgery.  I have reviewed the patient's chart and labs.  Questions were answered to the patient's satisfaction.     Newt Minion

## 2016-06-26 NOTE — Progress Notes (Signed)
Subjective: Underwent transmetatarsal amputation via Dr. Sharol Given this morning, minimal EBL. Prevena wound vac in place, running with trace serosanguinous fluid collected. Received IV fluids overnight. Per chart - spiked low fever to 100.8 coinciding with tachycardia to 110. Hb dropped from 8.2 to 6.7 overnight, 7.1 on recheck - no observed bleeding. Only symptom is dizziness, no dyspnea or chest pain.  Continues to endorse LLE pain but seems no worse than previous. Reports that the ortho post-op boot feels awkward and heavy on his foot, so will take some getting used to. In good spirits overall and eating lunch at the time of our visit.   Objective: Vital signs in last 24 hours: Vitals:   06/25/16 2156 06/26/16 0613 06/26/16 0622 06/26/16 0806  BP: 115/67 (!) 147/74  137/67  Pulse: 82 (!) 110  (!) 106  Resp: 17   18  Temp: 98.4 F (36.9 C) (!) 100.8 F (38.2 C)  100 F (37.8 C)  TempSrc: Oral Oral  Oral  SpO2: 96% (!) 81% 98% 95%  Weight:  203 lb 7.8 oz (92.3 kg)      Intake/Output Summary (Last 24 hours) at 06/26/16 0902 Last data filed at 06/25/16 2140  Gross per 24 hour  Intake           901.67 ml  Output              500 ml  Net           401.67 ml   Physical Exam General appearance: Young-appearing gentleman resting in bed, in no apparent distress, conversational and polite HENT: Normocephalic, atraumatic, moist mucous membranes Cardiovascular: Regular rate and rhythm, no murmurs, rubs, gallops Respiratory/Chest: Clear to ausculation bilaterally, normal work of breathing Abdomen: Soft, non-tender, non-distended Extremities: s/p left transmetatarsal amputation, wound vac in place over foot stump, resolving swelling and erythema of foot/ankle, calf, distal thigh - remains tender and warm to touch Skin: Warm, dry, intact - LLE skin changes per above Psych: Normal affect  Labs / Imaging / Procedures: CBC Latest Ref Rng & Units 06/26/2016 06/26/2016 06/25/2016  WBC 4.0 - 10.5  K/uL 8.8 8.4 8.4  Hemoglobin 13.0 - 17.0 g/dL 7.1(L) 6.7(LL) 8.2(L)  Hematocrit 39.0 - 52.0 % 21.4(L) 20.1(L) 25.5(L)  Platelets 150 - 400 K/uL 192 163 220   BMP Latest Ref Rng & Units 06/26/2016 06/25/2016 06/24/2016  Glucose 65 - 99 mg/dL 234(H) 237(H) 202(H)  BUN 6 - 20 mg/dL 31(H) 27(H) 26(H)  Creatinine 0.61 - 1.24 mg/dL 1.60(H) 1.31(H) 1.18  Sodium 135 - 145 mmol/L 136 139 140  Potassium 3.5 - 5.1 mmol/L 5.0 4.5 4.6  Chloride 101 - 111 mmol/L 107 107 107  CO2 22 - 32 mmol/L 24 26 25   Calcium 8.9 - 10.3 mg/dL 7.8(L) 8.4(L) 8.9   No results found. Assessment/Plan:  Shane [name] is a 46 y.o. man with PMH HTN, dCHF, DM2, HLD, anemia, osteomyelitis s/p amputation of left 3rd toe on 06/09/2016 admitted for cellulitis of his left lower leg and foot.  Severe LLE cellulitis, suspected osteomyelitis of 2nd and 3rd metatarsal heads Patient with rapidly spreading erythema and swelling of left LE two weeks following toe amputation and full course of doxycycline for osteomyelitis. Patient with subjective fevers and chills, though afebrile here. Site of surgery looks well; there is local violaceous region on dorsum of left foot that is separate from rest of erythema and edema that could be concerning for abscess. Foot XR does not show evidence of recurrent osteomyelitis.  Patient does have Well's score of 3, venous doppler prelim read negative for DVT. History of MRSA colonization, also T2DM moderately well controlled. Tight swelling of foot/ankle and severe pain seems out of proportion to cellulitis, concerning for possible underlying abscess or impending compartment syndrome. MRI on 11/8 showed severe cellulitis, myofasciitis, small abscesses adjacent to 2nd/3rd metatarsal heads and changes concerning for osteomyelitis. Underwent transmetatarsal amputation on 1/10 with Dr. Sharol Given. -- S/p transmetatarsal amputation 1/10 with Dr. Sharol Given -- Continue Vanc / Zosyn per pharmacy, plan to transition to oral antibiotic  tomorrow -- [facility] orthopedics consulted, appreciating recs -- Oxycodone-APAP 5-325 mg 1-2 tablets Q6H PRN for moderate to severe pain -- Morphine 2mg  IV Q2H PRN for severe pain, can increase to 4mg  IV or give 0.5 mg Dilaudid IV if pain control insufficient -- Zofran 4mg  Q6H PRN for nausea -- blood cultures drawn 1/8 NGTD  T2DM with gastroparesis and peripheral neuropathy, last A1c 8.7 in 05/2016. Patient on home regimen of metformin 1000mg  BID, glipizide 5mg  daily and Lantus 10U qhs. CBG on admission was 227. Poor po intake recently -- Hold metformin and glipizide -- Lantus 10 units QD -- SSI-S TID WC --continue reglan and gabapentin   Constipation, resolved, LBM 06/24/16 - had reported no BM 5 days, attributed to pain medications, mild abdominal distension but no overt tenderness -- Senokot-S QHS PRN -- MiraLax QD PRN  AKI, mild. Creatinine1.42 on admission from baseline 1.0 as well as new onset of urgency/difficulty initiating stream likely due to volume depletion. Cr now 1.8 despite IV fluid rehydration, may be renal injury secondary to IV antibiotics. -- Trend BMP  Anemia, Hb 6.7-7.1, normocytic, from 9.7 earlier this admission (baseline around 10). Asymptomatic. Elevated ferritin in 01/2016. Likely anemia of chronic disease due to recurrent infections and dilutional component this hospital stay from IV fluids -- Trend CBC, patient is Jehov[employer]'s Witness, NO transfusions.  -- States will accept Aranesp or IV iron  HTN, resolved with starting home medications. Patient on home regimen of amlodipine 10mg  daily, lisinopril 40mg  daily and coreg 12.5mg  BID.  --continue home amlodipine 10mg  daily and coreg 12.5mg  BID --hold lisinopril 40mg  daily in setting of AKI, can restart on d/c  Chronic diastolic heart failure, LVEF 55-60% with grade 2 diastolic dysfunction in [id number]. Home regimen of lisinopril 40mg  daily, coreg 12.5mg  BID; on exam he appears euvolemic. He received 500cc NS bolus for  AKI and mild increase in lactic acid that resolved with fluids. --monitor fluid status  Bipolar disorder, patient on home regimen of depakote and fluoxetine. --continue depakote 1000mg  daily and fluoxetine 20mg  daily.  Dispo: Anticipated discharge in approximately 2-3 day(s).   LOS: 2 days   Asencion Partridge, MD 06/26/2016, 9:02 AM Pager: [phone]

## 2016-06-26 NOTE — Op Note (Signed)
     Date of Surgery: 06/26/2016  INDICATIONS: Mr. Mill is a 46 y.o.-year-old male who is status post great toe second toe and third toe amputation. Patient has a sending cellulitis of his tibia and thigh. MRI scan shows osteomyelitis of the second and third metatarsal heads with a abscess. Patient cellulitis in the thigh and calf has resolved and patient presents at this time for transmetatarsal amputation.Marland Kitchen  PREOPERATIVE DIAGNOSIS: Abscess osteomyelitis left foot second and third metatarsal head  POSTOPERATIVE DIAGNOSIS: Same.  PROCEDURE: Transmetatarsal amputation Application of Prevena wound VAC TXA   IV 1 g and topically 2 g  SURGEON: Sharol Given, M.D.  ANESTHESIA:  general  IV FLUIDS AND URINE: See anesthesia.  ESTIMATED BLOOD LOSS: Minimal mL.  COMPLICATIONS: None.  DESCRIPTION OF PROCEDURE: The patient was brought to the operating room and underwent a general anesthetic. After adequate levels of anesthesia were obtained patient's lower extremity was prepped using DuraPrep draped into a sterile field. A timeout was called.  A fishmouth incision was made just proximal to the ulcerative nonviable tissue. This was carried sharply down to bone. A oscillating saw was used to perform a transmetatarsal amputation with a gentle cascade of the metatarsals and beveled plantarly. Electrocautery was used for hemostasis. The wound was irrigated with normal saline. The incision was closed using 2-0 nylon. A Prevena wound VAC was applied. This had a good suction fit. Patient was taken to the PACU in stable condition.  Meridee Score, MD [place] 9:35 AM

## 2016-06-26 NOTE — Anesthesia Preprocedure Evaluation (Addendum)
Anesthesia Evaluation  Patient identified by MRN, date of birth, ID band Patient awake    Reviewed: Allergy & Precautions, H&P , NPO status , Patient's Chart, lab work & pertinent test results, reviewed documented beta blocker date and time   Airway Mallampati: II  TM Distance: >3 FB Neck ROM: Full    Dental no notable dental hx. (+) Teeth Intact, Dental Advisory Given   Pulmonary neg pulmonary ROS,    Pulmonary exam normal breath sounds clear to auscultation       Cardiovascular hypertension, Pt. on medications and Pt. on home beta blockers +CHF  negative cardio ROS   Rhythm:Regular Rate:Normal     Neuro/Psych  Headaches, Depression Bipolar Disorder TIAnegative psych ROS   GI/Hepatic Neg liver ROS, GERD  Medicated and Controlled,  Endo/Other  diabetes, Insulin Dependent  Renal/GU negative Renal ROS  negative genitourinary   Musculoskeletal   Abdominal   Peds  Hematology  (+) anemia , JEHOVAH'S WITNESS  Anesthesia Other Findings   Reproductive/Obstetrics negative OB ROS                            Anesthesia Physical Anesthesia Plan  ASA: III  Anesthesia Plan: General   Post-op Pain Management:    Induction: Intravenous  Airway Management Planned: LMA  Additional Equipment:   Intra-op Plan:   Post-operative Plan: Extubation in OR  Informed Consent: I have reviewed the patients History and Physical, chart, labs and discussed the procedure including the risks, benefits and alternatives for the proposed anesthesia with the patient or authorized representative who has indicated his/her understanding and acceptance.   Dental advisory given  Plan Discussed with: CRNA  Anesthesia Plan Comments:         Anesthesia Quick Evaluation

## 2016-06-26 NOTE — H&P (View-Only) (Signed)
ORTHOPAEDIC CONSULTATION  REQUESTING PHYSICIAN: Sid Falcon, MD  Chief Complaint: Pain left foot and cellulitis left leg  HPI: Shane [name] is a 46 y.o. male who presents with increasing pain and cellulitis left foot and left leg. Patient reports an acute history of pain along the lateral aspect of his left foot status post third toe amputation approximately 2 weeks ago.  Past Medical History:  Diagnosis Date  . Anemia   . Bipolar disorder ([place])   . Cellulitis and abscess of left leg 06/2016  . Chest pain    a. 2015 Reportedly normal stress test in [place].  Marland Kitchen Chronic diastolic CHF (congestive heart failure) (HCC)    a.03/2015 Echo: EF 55-60%, Gr 1 DD, mild MR, triv PR.  . Depression   . Depression with anxiety   . Dyspnea    with exertion  . GERD (gastroesophageal reflux disease)   . Hyperlipidemia   . Hypertension    a. 08/2014 Admitted with hypertensive urgency.  . Insomnia   . Internal carotid artery stenosis   . Lower GI bleed 07/04/2015  . Neuropathy ([place])   . Refusal of blood transfusions as patient is Jehovah's Witness   . TIA (transient ischemic attack) 08/2014; 03/2015   a. 08/2014 in setting of hypertensive urgency.  . Type II diabetes mellitus ([place])    started insulin spring 2016, Type II  . Vitamin D deficiency spring 2016   Past Surgical History:  Procedure Laterality Date  . AMPUTATION Left 08/03/2015   Procedure: AMPUTATION LEFT GREAT TOE;  Surgeon: Newt Minion, MD;  Location: [place];  Service: Orthopedics;  Laterality: Left;  . AMPUTATION Left 12/02/2015   Procedure: amputation of left 2nd digit  . AMPUTATION Left 04/10/2016   Procedure: Left 2nd Toe Amputation at MTP Joint;  Surgeon: Newt Minion, MD;  Location: [place];  Service: Orthopedics;  Laterality: Left;  . AMPUTATION Left 06/09/2016   Procedure: AMPUTATION LEFT THIRD TOE;  Surgeon: [name], MD;  Location: [place];  Service: Orthopedics;  Laterality: Left;  . APPLICATION OF WOUND VAC Left  12/19/2015   Procedure: APPLICATION OF WOUND VAC;  Surgeon: Meredith Pel, MD;  Location: [place];  Service: Orthopedics;  Laterality: Left;  . CIRCUMCISION    . COLONOSCOPY N/A 01/22/2016   Procedure: COLONOSCOPY;  Surgeon: Gatha Mayer, MD;  Location: [place];  Service: Endoscopy;  Laterality: N/A;  . ESOPHAGOGASTRODUODENOSCOPY N/A 01/19/2016   Procedure: ESOPHAGOGASTRODUODENOSCOPY (EGD);  Surgeon: Manus Gunning, MD;  Location: [place];  Service: Gastroenterology;  Laterality: N/A;  . ESOPHAGOGASTRODUODENOSCOPY N/A 01/22/2016   Procedure: ESOPHAGOGASTRODUODENOSCOPY (EGD);  Surgeon: Gatha Mayer, MD;  Location: [facility] ENDOSCOPY;  Service: Endoscopy;  Laterality: N/A;  . I&D EXTREMITY Left 12/02/2015   Procedure: IRRIGATION AND DEBRIDEMENT OF FOOT; LEFT SECOND TOE AMPUTATION;  Surgeon: Meredith Pel, MD;  Location: [place];  Service: Orthopedics;  Laterality: Left;  . I&D EXTREMITY Left 12/19/2015   Procedure: I & D LEFT FOOT WITH BEADS ;  Surgeon: Meredith Pel, MD;  Location: [place];  Service: Orthopedics;  Laterality: Left;  . I&D EXTREMITY Right 01/17/2016   Procedure: IRRIGATION AND DEBRIDEMENT RIGHT FOOT;  Surgeon: Newt Minion, MD;  Location: [place];  Service: Orthopedics;  Laterality: Right;  . INCISION AND DRAINAGE FOOT Right 01/17/2016  . INGUINAL HERNIA REPAIR Bilateral ~ 1983- ~ 1986  . TONSILLECTOMY  ~ 58   Social History   Social History  . Marital  status: Married    Spouse name: N/A  . Number of children: N/A  . Years of education: N/A   Social History Main Topics  . Smoking status: Never Smoker  . Smokeless tobacco: Never Used  . Alcohol use No  . Drug use: No  . Sexual activity: No   Other Topics Concern  . None   Social History Narrative   Lives in [place] with wife.  Active but doesn't routinely exercise.   Family History  Problem Relation Age of Onset  . Hypertension Mother   . Diabetes Mother   . Hyperlipidemia Mother   . Heart disease  Mother     s/p pacemaker  . Diabetes Father   . Hypertension Father   . Stroke Father   . Heart attack Father     first MI @ 72.  . Stroke Brother    - negative except otherwise stated in the family history section Allergies  Allergen Reactions  . Lactose Intolerance (Gi) Diarrhea and Other (See Comments)    bloating  . Other Other (See Comments)    Red meat causes stomach pains, bloating and diarrhea  . Milk-Related Compounds Diarrhea and Other (See Comments)    Any dairy products  - diarrhea and bloating   Prior to Admission medications   Medication Sig Start Date End Date Taking? Authorizing Provider  amLODipine (NORVASC) 10 MG tablet Take 1 tablet (10 mg total) by mouth daily. 01/23/16  Yes Hosie Poisson, MD  aspirin EC 81 MG tablet Take 1 tablet (81 mg total) by mouth daily. 03/25/16  Yes Josalyn Funches, MD  atorvastatin (LIPITOR) 80 MG tablet Take 1 tablet (80 mg total) by mouth every morning. Patient taking differently: Take 80 mg by mouth daily.  08/18/15  Yes Josalyn Funches, MD  carvedilol (COREG) 25 MG tablet Take 1 tablet (25 mg total) by mouth 2 (two) times daily with a meal. 06/14/16  Yes Josalyn Funches, MD  divalproex (DEPAKOTE) 500 MG DR tablet Take 2 tablets (1,000 mg total) by mouth at bedtime. 12/04/15  Yes Christina P Rama, MD  FLUoxetine (PROZAC) 20 MG tablet Take 1 tablet (20 mg total) by mouth every morning. 03/25/16  Yes Josalyn Funches, MD  gabapentin (NEURONTIN) 100 MG capsule Take 2 capsules (200 mg total) by mouth 3 (three) times daily. 01/23/16  Yes Hosie Poisson, MD  ibuprofen (ADVIL,MOTRIN) 800 MG tablet TAKE 1 TABLET BY MOUTH EVERY 8 HOURS AS NEEDED. Patient taking differently: TAKE 1 TABLET BY MOUTH EVERY 8 HOURS AS NEEDED FOR PAIN 05/28/16  Yes Josalyn Funches, MD  Insulin Glargine (LANTUS SOLOSTAR) 100 UNIT/ML Solostar Pen Inject 13 Units into the skin every morning. Patient taking differently: Inject 10 Units into the skin every morning.  06/14/16  Yes  Josalyn Funches, MD  lisinopril (PRINIVIL,ZESTRIL) 40 MG tablet TAKE 1 TABLET BY MOUTH DAILY. 05/21/16  Yes Boykin Nearing, MD  metFORMIN (GLUCOPHAGE) 1000 MG tablet Take 1 tablet (1,000 mg total) by mouth 2 (two) times daily with a meal. 12/22/15  Yes Ripudeep K Rai, MD  metoCLOPramide (REGLAN) 5 MG tablet Take 1 tablet (5 mg total) by mouth 3 (three) times daily before meals. 05/30/16  Yes Josalyn Funches, MD  ondansetron (ZOFRAN ODT) 8 MG disintegrating tablet Take 1 tablet (8 mg total) by mouth every 8 (eight) hours as needed for nausea or vomiting. 02/26/16  Yes Josalyn Funches, MD  pantoprazole (PROTONIX) 40 MG tablet Take 40 mg by mouth daily.  04/23/16  Yes Historical Provider, MD  ranitidine (ZANTAC) 300 MG tablet Take 1 tablet (300 mg total) by mouth at bedtime. 06/14/16  Yes Josalyn Funches, MD  traZODone (DESYREL) 100 MG tablet Take 1 tablet (100 mg total) by mouth at bedtime. 02/26/16  Yes Josalyn Funches, MD  triamcinolone ointment (KENALOG) 0.5 % Apply 1 application topically 2 (two) times daily. Patient taking differently: Apply 1 application topically at bedtime as needed (rash).  04/26/16  Yes Josalyn Funches, MD  Vitamin D, Ergocalciferol, (DRISDOL) 50000 units CAPS capsule Take 1 capsule (50,000 Units total) by mouth every 30 (thirty) days. Every 30 days Patient taking differently: Take 50,000 Units by mouth every 30 (thirty) days. On or about the 1st day of each month 04/26/16  Yes Boykin Nearing, MD   Mr Foot Left W Wo Contrast  Result Date: 06/24/2016 CLINICAL DATA:  History of prior amputations. The most recent was the third toe. Pain, swelling and erythema. EXAM: MRI OF LOWER LEFT EXTREMITY WITHOUT AND WITH CONTRAST; MRI OF THE LEFT ANKLE WITHOUT AND WITH CONTRAST; MRI OF THE LEFT FOREFOOT WITHOUT AND WITH CONTRAST TECHNIQUE: Multiplanar, multisequence MR imaging of the left lower extremity was performed both before and after administration of intravenous contrast. CONTRAST:  20 cc  MultiHance COMPARISON:  Radiographs 06/23/2016 FINDINGS: Extensive subcutaneous soft tissue swelling/ edema/ fluid extending from the left knee all way down to the forefoot. No significant findings for myositis involving the calf. There is a 4 cm hematoma noted along the medial aspect of the calf adjacent to the medial gastrocs and soleus muscles. No findings to suggest septic arthritis at the knee or ankle joints. No findings for osteomyelitis involving the tibia or fibula. There is a 13 x 6 mm rim enhancing fluid collection in the region of the recent third toe amputation most consistent with a postoperative abscess. There is also a small fluid collection along the second metatarsal head with some enhancement. Could not exclude a small/ developing abscess. Signal abnormality and enhancement in the second and third metatarsal heads is suspicious for osteomyelitis. Fairly extensive cellulitis and myofasciitis involving the forefoot and midfoot regions. The mid and hindfoot bony structures are intact. Incidental note is made of a chronically torn flexor hallucis longus tendon. IMPRESSION: Severe diffuse cellulitis involving the entire imaged left lower extremity. Two small abscesses distal to the second and third metatarsal heads. Suspect osteomyelitis involving the second and third metatarsal heads. Cellulitis and diffuse myofasciitis involving the forefoot and midfoot regions. Electronically Signed   By: Marijo Sanes M.D.   On: 06/24/2016 15:29   Mr Tibia Fibula Left W Wo Contrast  Result Date: 06/24/2016 CLINICAL DATA:  History of prior amputations. The most recent was the third toe. Pain, swelling and erythema. EXAM: MRI OF LOWER LEFT EXTREMITY WITHOUT AND WITH CONTRAST; MRI OF THE LEFT ANKLE WITHOUT AND WITH CONTRAST; MRI OF THE LEFT FOREFOOT WITHOUT AND WITH CONTRAST TECHNIQUE: Multiplanar, multisequence MR imaging of the left lower extremity was performed both before and after administration of intravenous  contrast. CONTRAST:  20 cc MultiHance COMPARISON:  Radiographs 06/23/2016 FINDINGS: Extensive subcutaneous soft tissue swelling/ edema/ fluid extending from the left knee all way down to the forefoot. No significant findings for myositis involving the calf. There is a 4 cm hematoma noted along the medial aspect of the calf adjacent to the medial gastrocs and soleus muscles. No findings to suggest septic arthritis at the knee or ankle joints. No findings for osteomyelitis involving the tibia or fibula. There is a 13 x 6 mm  rim enhancing fluid collection in the region of the recent third toe amputation most consistent with a postoperative abscess. There is also a small fluid collection along the second metatarsal head with some enhancement. Could not exclude a small/ developing abscess. Signal abnormality and enhancement in the second and third metatarsal heads is suspicious for osteomyelitis. Fairly extensive cellulitis and myofasciitis involving the forefoot and midfoot regions. The mid and hindfoot bony structures are intact. Incidental note is made of a chronically torn flexor hallucis longus tendon. IMPRESSION: Severe diffuse cellulitis involving the entire imaged left lower extremity. Two small abscesses distal to the second and third metatarsal heads. Suspect osteomyelitis involving the second and third metatarsal heads. Cellulitis and diffuse myofasciitis involving the forefoot and midfoot regions. Electronically Signed   By: Marijo Sanes M.D.   On: 06/24/2016 15:29   Mr Ankle Left W Wo Contrast  Result Date: 06/24/2016 CLINICAL DATA:  History of prior amputations. The most recent was the third toe. Pain, swelling and erythema. EXAM: MRI OF LOWER LEFT EXTREMITY WITHOUT AND WITH CONTRAST; MRI OF THE LEFT ANKLE WITHOUT AND WITH CONTRAST; MRI OF THE LEFT FOREFOOT WITHOUT AND WITH CONTRAST TECHNIQUE: Multiplanar, multisequence MR imaging of the left lower extremity was performed both before and after  administration of intravenous contrast. CONTRAST:  20 cc MultiHance COMPARISON:  Radiographs 06/23/2016 FINDINGS: Extensive subcutaneous soft tissue swelling/ edema/ fluid extending from the left knee all way down to the forefoot. No significant findings for myositis involving the calf. There is a 4 cm hematoma noted along the medial aspect of the calf adjacent to the medial gastrocs and soleus muscles. No findings to suggest septic arthritis at the knee or ankle joints. No findings for osteomyelitis involving the tibia or fibula. There is a 13 x 6 mm rim enhancing fluid collection in the region of the recent third toe amputation most consistent with a postoperative abscess. There is also a small fluid collection along the second metatarsal head with some enhancement. Could not exclude a small/ developing abscess. Signal abnormality and enhancement in the second and third metatarsal heads is suspicious for osteomyelitis. Fairly extensive cellulitis and myofasciitis involving the forefoot and midfoot regions. The mid and hindfoot bony structures are intact. Incidental note is made of a chronically torn flexor hallucis longus tendon. IMPRESSION: Severe diffuse cellulitis involving the entire imaged left lower extremity. Two small abscesses distal to the second and third metatarsal heads. Suspect osteomyelitis involving the second and third metatarsal heads. Cellulitis and diffuse myofasciitis involving the forefoot and midfoot regions. Electronically Signed   By: Marijo Sanes M.D.   On: 06/24/2016 15:29   Dg Foot Complete Left  Result Date: 06/23/2016 CLINICAL DATA:  Redness and swelling beginning today extending from the left ankle to the midshaft left tibia and fibula. Previous amputation of left toes. History of osteomyelitis. EXAM: LEFT FOOT - COMPLETE 3+ VIEW COMPARISON:  MRI left foot 06/09/2016.  Left foot 06/08/2016. FINDINGS: Postoperative changes with amputations of the first, second, and third toes at  the level of the metatarsal phalangeal joint. Amputation of the third toe is new since previous study. Periosteal reaction along the shaft of the second metatarsal is unchanged since prior study and probably represents chronic process. No evidence of acute cortical erosion or sclerosis to suggest residual or recurrent osteomyelitis. No focal bone lesion or bone destruction. Degenerative changes in the intertarsal joints. Prominent vascular calcifications. IMPRESSION: Amputations of the left first, second, and third toes. No acute bone erosions to suggest  residual or recurrent osteomyelitis. Electronically Signed   By: Lucienne Capers M.D.   On: 06/23/2016 23:35   - pertinent xrays, CT, MRI studies were reviewed and independently interpreted  Positive ROS: All other systems have been reviewed and were otherwise negative with the exception of those mentioned in the HPI and as above.  Physical Exam: General: Alert, no acute distress Psychiatric: Patient is competent for consent with normal mood and affect Lymphatic: No axillary or cervical lymphadenopathy Cardiovascular: No pedal edema Respiratory: No cyanosis, no use of accessory musculature GI: No organomegaly, abdomen is soft and non-tender  Skin: Examination patient has cellulitis which is now proximal to the marked line on his left calf his cellulitis extends down to his mid thigh on the left. Patient has tenderness to palpation across the left forefoot.   Neurologic: Patient does not have protective sensation bilateral lower extremities.   MUSCULOSKELETAL:  Examination patient has swelling in the foot and ankle and leg on the left lower extremity. Ultrasound negative for DVT. I cannot palpate a pulse due to the swelling. Patient has tenderness to palpation across the forefoot. There is no purulent drainage there is cellulitis that extends along the plantar aspect of his foot.The thigh and calf are soft nontender to palpation. Patient has no  crepitation no signs or symptoms of necrotizing fasciitis. Review the MRI scan of the leg ankle and foot shows abscess and osteomyelitis of the second and third metatarsal heads left foot. Patient has no abscess in the foot and ankle or leg. Patient does have a hematoma in the left calf this is. It is not tender to palpation in this does not enhance with contrast.  Assessment: Assessment: Osteomyelitis small focal abscess second and third metatarsal heads left foot status post third toe amputation with diabetic insensate neuropathy with progressive cellulitis in the left leg and left thigh.  Plan: Plan: We'll plan for a left transmetatarsal amputation tomorrow. I am unsure why the patient has progressive cellulitis. This should resolve once the source of the infection has been removed.  Thank you for the consult and the opportunity to see Mr. Shane Pott, MD [facility] [phone] 9:57 AM

## 2016-06-27 ENCOUNTER — Inpatient Hospital Stay (HOSPITAL_COMMUNITY): Payer: Medicaid Other

## 2016-06-27 ENCOUNTER — Encounter (HOSPITAL_COMMUNITY): Payer: Self-pay | Admitting: Orthopedic Surgery

## 2016-06-27 LAB — BASIC METABOLIC PANEL
Anion gap: 5 (ref 5–15)
BUN: 26 mg/dL — AB (ref 6–20)
CALCIUM: 8.1 mg/dL — AB (ref 8.9–10.3)
CHLORIDE: 106 mmol/L (ref 101–111)
CO2: 25 mmol/L (ref 22–32)
Creatinine, Ser: 1.47 mg/dL — ABNORMAL HIGH (ref 0.61–1.24)
GFR calc non Af Amer: 55 mL/min — ABNORMAL LOW (ref >60)
Glucose, Bld: 222 mg/dL — ABNORMAL HIGH (ref 65–99)
Potassium: 4.9 mmol/L (ref 3.5–5.1)
SODIUM: 136 mmol/L (ref 135–145)

## 2016-06-27 LAB — CBC
HCT: 19.5 % — ABNORMAL LOW (ref 39.0–52.0)
Hemoglobin: 6.5 g/dL — CL (ref 13.0–17.0)
MCH: 31 pg (ref 26.0–34.0)
MCHC: 33.3 g/dL (ref 30.0–36.0)
MCV: 92.9 fL (ref 78.0–100.0)
PLATELETS: 166 10*3/uL (ref 150–400)
RBC: 2.1 MIL/uL — AB (ref 4.22–5.81)
RDW: 14.9 % (ref 11.5–15.5)
WBC: 10 10*3/uL (ref 4.0–10.5)

## 2016-06-27 LAB — GLUCOSE, CAPILLARY
GLUCOSE-CAPILLARY: 240 mg/dL — AB (ref 65–99)
Glucose-Capillary: 301 mg/dL — ABNORMAL HIGH (ref 65–99)
Glucose-Capillary: 160 mg/dL — ABNORMAL HIGH (ref 65–99)
Glucose-Capillary: 219 mg/dL — ABNORMAL HIGH (ref 65–99)

## 2016-06-27 LAB — TROPONIN I
Troponin I: 0.08 ng/mL (ref <0.03)
Troponin I: 0.07 ng/mL (ref <0.03)

## 2016-06-27 MED ORDER — FOLIC ACID 1 MG PO TABS
1.0000 mg | ORAL_TABLET | Freq: Every day | ORAL | Status: DC
  Administered 2016-06-27 – 2016-07-03 (×7): 1 mg via ORAL
  Filled 2016-06-27 (×7): qty 1

## 2016-06-27 MED ORDER — POLYETHYLENE GLYCOL 3350 17 G PO PACK: 17.0000 g | PACK | Freq: Two times a day (BID) | ORAL | Status: DC | PRN

## 2016-06-27 MED ORDER — CHLORHEXIDINE GLUCONATE CLOTH 2 % EX PADS
6.0000 | MEDICATED_PAD | Freq: Every day | CUTANEOUS | Status: AC
  Administered 2016-06-27 – 2016-07-01 (×5): 6 via TOPICAL

## 2016-06-27 MED ORDER — INSULIN ASPART 100 UNIT/ML ~~LOC~~ SOLN
0.0000 [IU] | Freq: Every day | SUBCUTANEOUS | Status: DC
  Administered 2016-06-27 – 2016-07-02 (×3): 2 [IU] via SUBCUTANEOUS

## 2016-06-27 MED ORDER — VITAMIN B-12 1000 MCG PO TABS
1000.0000 ug | ORAL_TABLET | Freq: Every day | ORAL | Status: DC
  Administered 2016-06-27 – 2016-07-03 (×7): 1000 ug via ORAL
  Filled 2016-06-27 (×7): qty 1

## 2016-06-27 MED ORDER — INSULIN GLARGINE 100 UNIT/ML ~~LOC~~ SOLN
12.0000 [IU] | Freq: Every day | SUBCUTANEOUS | Status: DC
  Administered 2016-06-28: 12 [IU] via SUBCUTANEOUS
  Filled 2016-06-27 (×2): qty 0.12

## 2016-06-27 MED ORDER — MUPIROCIN 2 % EX OINT
1.0000 "application " | TOPICAL_OINTMENT | Freq: Two times a day (BID) | CUTANEOUS | Status: AC
  Administered 2016-06-27 – 2016-07-01 (×10): 1 via NASAL
  Filled 2016-06-27 (×2): qty 22

## 2016-06-27 MED ORDER — INSULIN ASPART 100 UNIT/ML ~~LOC~~ SOLN
0.0000 [IU] | Freq: Three times a day (TID) | SUBCUTANEOUS | Status: DC
  Administered 2016-06-27: 3 [IU] via SUBCUTANEOUS
  Administered 2016-06-27: 11 [IU] via SUBCUTANEOUS
  Administered 2016-06-28 – 2016-06-29 (×4): 3 [IU] via SUBCUTANEOUS
  Administered 2016-06-29: 2 [IU] via SUBCUTANEOUS
  Administered 2016-06-29: 3 [IU] via SUBCUTANEOUS
  Administered 2016-06-30 (×2): 2 [IU] via SUBCUTANEOUS
  Administered 2016-06-30 – 2016-07-01 (×2): 8 [IU] via SUBCUTANEOUS
  Administered 2016-07-02 (×2): 3 [IU] via SUBCUTANEOUS
  Administered 2016-07-02: 5 [IU] via SUBCUTANEOUS
  Administered 2016-07-03: 3 [IU] via SUBCUTANEOUS
  Administered 2016-07-03: 8 [IU] via SUBCUTANEOUS

## 2016-06-27 MED ORDER — IOPAMIDOL (ISOVUE-370) INJECTION 76%
INTRAVENOUS | Status: AC
  Administered 2016-06-27: 100 mL
  Filled 2016-06-27: qty 100

## 2016-06-27 MED ORDER — SENNOSIDES-DOCUSATE SODIUM 8.6-50 MG PO TABS
2.0000 | ORAL_TABLET | Freq: Every day | ORAL | Status: DC
  Administered 2016-06-27: 2 via ORAL
  Filled 2016-06-27 (×4): qty 2

## 2016-06-27 MED ORDER — SODIUM CHLORIDE 0.9 % IV BOLUS (SEPSIS)
250.0000 mL | Freq: Once | INTRAVENOUS | Status: AC
  Administered 2016-06-27: 250 mL via INTRAVENOUS

## 2016-06-27 NOTE — Progress Notes (Signed)
ECG completed and resulted "normal sinus rhythm normal ECG". MD paged to make aware.

## 2016-06-27 NOTE — Care Management Note (Signed)
Case Management Note  Patient Details  Name: Shane [name] MRN: [id number] Date of Birth: [birth year]  Subjective/Objective:                    Action/Plan:   Expected Discharge Date:                  Expected Discharge Plan:  [place]  In-House Referral:  Clinical Social Work  Discharge planning Services     Post Acute Care Choice:    Choice offered to:     DME Arranged:    DME Agency:     HH Arranged:    [place] Agency:     Status of Service:  In process, will continue to follow  If discussed at Long Length of Stay Meetings, dates discussed:    Additional Comments:  Marilu Favre, RN 06/27/2016, 2:24 PM

## 2016-06-27 NOTE — Progress Notes (Signed)
Subjective: Febrile to 102.4 this AM, endorsing more dizziness, fatigue, dyspnea- Hb 6.5.. Has chronic sensation of chest pressure when laying supine/flat at night. Leg appears better, has been ambulatory in his room but with orthostatic symptoms. He has developed a patch of ecchymosis over posterior left thigh that is tender. Reports that pain is well controlled with the Morphine and Percocet he has available.   Increased fatigue, dyspnea, pleuritic chest pain, new O2 requirement - no visible leg edema but has not had DVT ppx since before surgery.   Objective: Vital signs in last 24 hours: Vitals:   06/27/16 0619 06/27/16 0743 06/27/16 0746 06/27/16 0831  BP: 128/74   126/60  Pulse: (!) 107 (!) 102  91  Resp: 15 20  18   Temp: (!) 102.4 F (39.1 C) (!) 101.9 F (38.8 C)  99.9 F (37.7 C)  TempSrc: Oral Oral  Oral  SpO2: 94% (!) 86% 95% 99%  Weight:      Height:        Intake/Output Summary (Last 24 hours) at 06/27/16 0916 Last data filed at 06/27/16 0636  Gross per 24 hour  Intake           1257.5 ml  Output              745 ml  Net            512.5 ml   Physical Exam General appearance: Young-appearing gentleman resting in bed, in no apparent distress, conversational and polite HENT: Normocephalic, atraumatic, moist mucous membranes Cardiovascular: Regular rate and rhythm, no murmurs, rubs, gallops Respiratory/Chest: Clear to ausculation bilaterally, normal work of breathing Abdomen: Soft, non-tender, non-distended Extremities: s/p left transmetatarsal amputation, wound vac in place over foot stump, resolving swelling and erythema of foot/ankle/calf,  Skin: Warm, dry, intact - new area of ecchymoses over left posterior distal thigh Psych: Normal affect  Labs / Imaging / Procedures: CBC Latest Ref Rng & Units 06/27/2016 06/26/2016 06/26/2016  WBC 4.0 - 10.5 K/uL 10.0 8.8 8.4  Hemoglobin 13.0 - 17.0 g/dL 6.5(LL) 7.1(L) 6.7(LL)  Hematocrit 39.0 - 52.0 % 19.5(L) 21.4(L)  20.1(L)  Platelets 150 - 400 K/uL 166 192 163   BMP Latest Ref Rng & Units 06/27/2016 06/26/2016 06/25/2016  Glucose 65 - 99 mg/dL 222(H) 234(H) 237(H)  BUN 6 - 20 mg/dL 26(H) 31(H) 27(H)  Creatinine 0.61 - 1.24 mg/dL 1.47(H) 1.60(H) 1.31(H)  Sodium 135 - 145 mmol/L 136 136 139  Potassium 3.5 - 5.1 mmol/L 4.9 5.0 4.5  Chloride 101 - 111 mmol/L 106 107 107  CO2 22 - 32 mmol/L 25 24 26   Calcium 8.9 - 10.3 mg/dL 8.1(L) 7.8(L) 8.4(L)   No results found. Assessment/Plan:  Dawsyn Kalkman is a 46 y.o. man with PMH HTN, dCHF, DM2, HLD, anemia, osteomyelitis s/p amputation of left 3rd toe on 06/09/2016 admitted for cellulitis of his left lower leg and foot.  Pleuritic chest pain, worsening this AM with dyspnea, new O2 requirement of 2L via nasal cannula. Concerning for demand ischemia in setting of anemia (HB 6.5) vs pneumonia vs pulmonary embolism - CXR this AM with LLL atelectasis vs pneumonia - remains on broad spectrum Abx - Stat Troponin and EKG this AM, depending on results may require further imaging  - SCDs - May need CTA chest, heparin gtt if mounting concern for PE  Severe LLE cellulitis and osteomyelitis of 2nd and 3rd metatarsal heads Patient with rapidly spreading erythema and swelling of left LE two weeks following toe  amputation and full course of doxycycline for osteomyelitis. Patient with subjective fevers and chills, though afebrile here. Site of surgery looks well; there is local violaceous region on dorsum of left foot that is separate from rest of erythema and edema that could be concerning for abscess. Foot XR does not show evidence of recurrent osteomyelitis. Patient does have Well's score of 3, venous doppler prelim read negative for DVT. History of MRSA colonization, also T2DM moderately well controlled. Tight swelling of foot/ankle and severe pain seems out of proportion to cellulitis, concerning for possible underlying abscess or impending compartment syndrome. MRI on 11/8 showed  severe cellulitis, myofasciitis, small abscesses adjacent to 2nd/3rd metatarsal heads and changes concerning for osteomyelitis. Underwent transmetatarsal amputation on 1/10 with Dr. Sharol Given. -- POD1, spiked fever this AM - rpt blood cx, check urine cx, CXR -- advised ice packs to thigh bruise -- Continue Vanc / Zosyn per pharmacy -- [facility] orthopedics consulted, to follow up with Dr. Sharol Given -- Oxycodone-APAP 5-325 mg 1-2 tablets Q6H PRN for moderate to severe pain -- Morphine 2mg  IV Q2H PRN for severe pain, can increase to 4mg  IV or give 0.5 mg Dilaudid IV if pain control insufficient -- Zofran 4mg  Q6H PRN for nausea -- blood cultures drawn 1/8 NGTD -- PT eval and treat  T2DM with gastroparesis and peripheral neuropathy, last A1c 8.7 in 05/2016. Patient on home regimen of metformin 1000mg  BID, glipizide 5mg  daily and Lantus 10U qhs. CBGs elevated in 200s -- Hold metformin and glipizide -- Increase Lantus 12 units QD -- Adjusted to moderate SSI --continue reglan and gabapentin   Constipation, persistent LBM 06/24/16 - had reported no BM 5 days, attributed to pain medications, abdominal distension with generalized tenderness again  -- Senokot-S 2 tablet QHS -- MiraLax BID PRN  AKI, mild. Creatinine1.42 on admission from baseline 1.0 as well as new onset of urgency/difficulty initiating stream likely due to volume depletion. Cr now 1.8 despite IV fluid rehydration, may be renal injury secondary to IV antibiotics. -- Trend BMP  Anemia, Hb 6.5, normocytic, from 9.7 earlier this admission (baseline around 10). Asymptomatic. Elevated ferritin in 01/2016. Likely anemia of chronic disease due to recurrent infections and dilutional component this hospital stay from IV fluids -- Trend CBC, patient is Jehovah's Witness, NO transfusions.  -- States will accept Aranesp or IV iron  HTN, resolved with starting home medications. Patient on home regimen of amlodipine 10mg  daily, lisinopril 40mg  daily and  coreg 12.5mg  BID.  --continue home amlodipine 10mg  daily and coreg 12.5mg  BID --hold lisinopril 40mg  daily in setting of AKI, can restart on d/c  Chronic diastolic heart failure, LVEF 55-60% with grade 2 diastolic dysfunction in [id number]. Home regimen of lisinopril 40mg  daily, coreg 12.5mg  BID; on exam he appears euvolemic. He received 500cc NS bolus for AKI and mild increase in lactic acid that resolved with fluids. --monitor fluid status  Bipolar disorder, patient on home regimen of depakote and fluoxetine. --continue depakote 1000mg  daily and fluoxetine 20mg  daily.  Dispo: Anticipated discharge in approximately 2-3 day(s).   LOS: 3 days   Asencion Partridge, MD 06/27/2016, 9:16 AM Pager: [phone]

## 2016-06-27 NOTE — Progress Notes (Signed)
SATURATION QUALIFICATIONS: (This note is used to comply with regulatory documentation for home oxygen)  Patient Saturations on Room Air at Rest = 80%  Patient Saturations on Room Air while Ambulating = did not attempt as pt desats on RA  Patient Saturations on 2 Liters of oxygen while Ambulating = 93%  Please briefly explain why patient needs home oxygen: Pt desats without O2 on room air  Tonia Brooms, SPT [phone]

## 2016-06-27 NOTE — Progress Notes (Signed)
Lab contacted RN and stated troponin 0.08. MD paged to make aware.

## 2016-06-27 NOTE — Progress Notes (Signed)
   06/27/16 1806  What Happened  Was fall witnessed? No  Was patient injured? Unsure  Patient found on floor  Found by Staff-comment (dietary)  Stated prior activity bathroom-unassisted  Follow Up  MD notified dr. Wynetta Emery   Time MD notified [phone]  Family notified Yes-comment (patient himself)  Time family notified 1807  Additional tests No  Simple treatment (pt refused)  Progress note created (see row info) Yes  Adult Fall Risk Assessment  Risk Factor Category (scoring not indicated) Fall has occurred during this admission (document High fall risk)  Patient's Fall Risk High Fall Risk (>13 points)  Adult Fall Risk Interventions  Required Bundle Interventions *See Row Information* High fall risk - low, moderate, and high requirements implemented  Screening for Fall Injury Risk  Risk For Fall Injury- See Row Information  Nurse judgement  Pain Assessment  Pain Assessment 0-10  Pain Score 8  Pain Type Surgical pain  Pain Location Foot  Pain Orientation Left  Pain Descriptors / Indicators Aching  Pain Frequency Constant  Pain Onset With Activity  Patients Stated Pain Goal 4  Pain Intervention(s) Repositioned  Neurological  Level of Consciousness Alert  Orientation Level Oriented X4  Cognition Follows commands  Speech Clear  Pupil Assessment  Yes  R Pupil Size (mm) 2  R Pupil Shape Round  R Pupil Reaction Brisk  L Pupil Size (mm) 2  L Pupil Shape Round  L Pupil Reaction Brisk  Additional Pupil Assessments No  Neuro Additional Assessments No  Musculoskeletal  Musculoskeletal (WDL) X  Generalized Weakness Yes  Weight Bearing Restrictions Yes  LLE Weight Bearing TWB  Musculoskeletal Details  RUE Full movement  LUE Full movement  RLE Full movement  LLE Amputated toes;Surgery  Integumentary  Integumentary (WDL) (no new hematomas of skin problems r/t fall)

## 2016-06-27 NOTE — Progress Notes (Signed)
PT Cancellation Note  Patient Details Name: Shane [name] MRN: [id number] DOB: Nov 10, 1970   Cancelled Treatment:    Reason Eval/Treat Not Completed: Patient at procedure or test/unavailable Pt at diagnostic radiology. Will check back as time allows.  Tonia Brooms 06/27/2016, 9:45 AM Tonia Brooms, SPT [phone]

## 2016-06-27 NOTE — Progress Notes (Signed)
Physical Therapy Evaluation Patient Details Name: Shane [name] MRN: [id number] DOB: [birth year] Today's Date: 06/27/2016   History of Present Illness  Shane [name] is a 46 y.o. male admitted on 06/23/2016 with cellulitis.  Pharmacy has been consulted for vancomycin and zosyn dosing.  S/p trans-met amputation. PMH DMII, HTN, bipolar, dyspnea, and chest pain   Clinical Impression  PTA, pt was independent with all ADLs and ambulated with RW. Pt currently lives with wife who has back problems but will likely be able to provide minimal physical assist 24/7. Pt becomes orthostatic upon sitting EOB (BP 115/68) and standing (BP 80/53). Spoke with RN who agrees this is likely 2/2 low HgB status. Pt agreeable to pivot to chair and was able to do so with min A and good compliance with TWB status. BP increased to 107/68 after 3 minutes seated in rest. Pt agreeable to go to SNF for increased time to heal before return to home. Pt understands that he will not receive therapy at SNF. PT will continue to follow to address decreased mobility and activity tolerance before d/c to next venue of care.   SATURATION QUALIFICATIONS: (This note is used to comply with regulatory documentation for home oxygen)  Patient Saturations on Room Air at Rest = 80%  Patient Saturations on Room Air while Ambulating = did not attempt as pt desats on RA  Patient Saturations on 2 Liters of oxygen while Ambulating = 93%  Please briefly explain why patient needs home oxygen: Pt desats without O2 on room air   Follow Up Recommendations SNF;Supervision for mobility/OOB (pt aware that therapy is not covered)    Equipment Recommendations  Wheelchair (measurements PT);Wheelchair cushion (measurements PT) (18"x18", pressure relieving cushion, antitippers) elevating leg rests, desk arms   Recommendations for Other Services       Precautions / Restrictions Precautions Precautions: Fall Restrictions Weight Bearing Restrictions:  Yes LLE Weight Bearing: Touchdown weight bearing      Mobility  Bed Mobility Overal bed mobility: Needs Assistance Bed Mobility: Rolling;Sidelying to Sit Rolling: Min guard Sidelying to sit: Min assist       General bed mobility comments: Pt requires min A for LE mobility OOB. Pt with good UE strength to push up into sitting. VCs for hand placement and sequencing. Pt becomes orthostatic with sitting EOB.  Transfers Overall transfer level: Needs assistance Equipment used: Rolling walker (2 wheeled) Transfers: Sit to/from [facility] Sit to Stand: Min assist Stand pivot transfers: Min assist       General transfer comment: Pt with good UE strength to push up into standing. Min A to power up to stand and maintain standing balance. Pt with good compliance with TWB status. Pt able to take small steps to pivot to chair. Pt becomes orthostatic with standing.  Ambulation/Gait                Stairs            Wheelchair Mobility    Modified Rankin (Stroke Patients Only)       Balance Overall balance assessment: Needs assistance Sitting-balance support: No upper extremity supported;Feet supported Sitting balance-Leahy Scale: Good Sitting balance - Comments: Able to sit EOB without UE assist   Standing balance support: Bilateral upper extremity supported;During functional activity Standing balance-Leahy Scale: Poor Standing balance comment: Reliant on UE to maintain TWB status  Pertinent Vitals/Pain Pain Assessment: 0-10 Pain Score: 10-Worst pain ever Pain Location: surgical site Pain Descriptors / Indicators: Aching;Grimacing;Operative site guarding Pain Intervention(s): Limited activity within patient's tolerance;Monitored during session;Premedicated before session;Repositioned  BP EOB 115/68 BP standing 90/53 BP sitting 3 minutes post transfer 107/64  Home Living Family/patient expects to be  discharged to:: Private residence Living Arrangements: Spouse/significant other Available Help at Discharge: Family;Available 24 hours/day Type of Home: House Home Access: Stairs to enter Entrance Stairs-Rails: Right Entrance Stairs-Number of Steps: 3 Home Layout: One level Home Equipment: Walker - 2 wheels;Cane - single point;Bedside commode      Prior Function Level of Independence: Needs assistance   Gait / Transfers Assistance Needed: Used RW prior to admission  ADL's / Homemaking Assistance Needed: states that wife did not assist with ADLs        Hand Dominance        Extremity/Trunk Assessment   Upper Extremity Assessment Upper Extremity Assessment: Overall WFL for tasks assessed    Lower Extremity Assessment Lower Extremity Assessment: LLE deficits/detail LLE Deficits / Details: Painful and limited ROM as expected post-operatively       Communication   Communication: No difficulties  Cognition Arousal/Alertness: Awake/alert Behavior During Therapy: WFL for tasks assessed/performed Overall Cognitive Status: Within Functional Limits for tasks assessed                      General Comments      Exercises     Assessment/Plan    PT Assessment Patient needs continued PT services  PT Problem List Decreased strength;Decreased range of motion;Decreased activity tolerance;Decreased balance;Decreased mobility;Decreased knowledge of use of DME;Decreased safety awareness;Decreased knowledge of precautions;Pain          PT Treatment Interventions DME instruction;Gait training;Therapeutic activities;Therapeutic exercise;Functional mobility training;Patient/family education;Balance training    PT Goals (Current goals can be found in the Care Plan section)  Acute Rehab PT Goals Patient Stated Goal: to walk PT Goal Formulation: With patient Time For Goal Achievement: 07/11/16 Potential to Achieve Goals: Good    Frequency Min 3X/week   Barriers to  discharge        Co-evaluation               End of Session Equipment Utilized During Treatment: Gait belt;Oxygen Activity Tolerance: Patient limited by pain Patient left: in chair;with call bell/phone within reach Nurse Communication: Mobility status         Time: 5329-9242 PT Time Calculation (min) (ACUTE ONLY): 40 min   Charges:   PT Evaluation $PT Eval Moderate Complexity: 1 Procedure PT Treatments $Therapeutic Activity: 8-22 mins   PT G Codes:        Tonia Brooms 07/08/16, 12:43 PM Tonia Brooms, SPT [phone]

## 2016-06-27 NOTE — Progress Notes (Signed)
Patient ID: Shane [name], male   DOB: [birth year], 46 y.o.   MRN: [id number] Postoperative day 1 left transmetatarsal amputation. Patient is ambulating in the room. The wound VAC dressing is clean and dry there is no blood in the canister the VAC is working well. Patient may discharge to home at this time does not need antibiotics I will follow-up in the office in 1-2 weeks

## 2016-06-27 NOTE — Progress Notes (Addendum)
Patient has been calling to nurses and tech throughout day when he need assistance. Patient did not call out an attempted to get out of bed to use urinal when earlier he stated he was okay using it in bed. Patient was found sitting on floor and stated he did hit his head. Patient denied any headache or new pain from fall. No new skin injuries. PEERLA. VSS. Dr. Wynetta Emery made aware of fall and no new orders received at this time. Patient assisted back to bed by RN and NT. Safety plan reviewed with patient and bed alarm placed. Yellow socks and arm band placed.    6:36 PM  patient now stating he was a little disoriented after the fall and he was down for "a good five minutes". Patient now stating he has a headache 8/10 throbbing in the back of his head where he hit the ground. MD paged to make aware of updates. Dr. Wynetta Emery stated that he will come assess patient and to give prn tylenol.   7:27 PM  Patient stated headache improving with tylenol

## 2016-06-27 NOTE — Progress Notes (Signed)
IMTS Cross Cover Progress Note  Shane [name] is a 46yo male admitted for osteomyelitis and cellulitis of left foot and is s/p transmetatarsal amputation. He was evaluated at bedside after a fall. Patient states that he was trying to sit up to use his urinal, became lightheaded and fell. He states that he hit the back of his head and developed a headache. He denies vision or hearing changes (has chronic blurry vision despite correction), numbness, tingling, focal weakness, confusion, or other symptoms that are new compared to prior in the day. He is still having some shortness of breath, chest discomfort, dizziness even when supine, and general weakness. He states that his headache is almost resolved after Tylenol.  Physical Exam: Constitutional: NAD, some mental slowing similar to exam earlier in the day.  Head: atraumatic, nontender MSK: wound vac in place; cervical spine atraumatic and nontender; decreased strength in L leg even at hip that is unchanged from earlier today per patient - could be protective of of surgical site Neuro: A&Ox3, CN 2-12 intact  Assessment & Plan: Patient with lightheadedness and fall likely 2/2 acute on chronic anemia. Patient is Jehova's witness and does not wish to receive transfusions. At this time, no focal neurological deficits are apparent and headache is almost resolved. Patient continues to use incentive spirometer with great effort (750cc). Admitting team was trending troponins due to patient having chest pain; they were 0.08 and 0.07, likely from demand. --Patient states understanding that his symptoms are from anemia --No head imaging indicated at this time as neurologically stable from previous; will continue monitoring for developing symptoms --No further troponin trend unless new symptoms arise --encouraged use of incentive spirometer --encouraged patient to continued getting nursing assistance; he is on bed alarm, in non-slip socks and arm band  placed  Alphonzo Grieve, MD IMTS - PGY1 Pager [phone]

## 2016-06-28 ENCOUNTER — Ambulatory Visit (INDEPENDENT_AMBULATORY_CARE_PROVIDER_SITE_OTHER): Payer: Medicaid Other | Admitting: Family

## 2016-06-28 LAB — CBC
HEMATOCRIT: 19.6 % — AB (ref 39.0–52.0)
HEMOGLOBIN: 6.4 g/dL — AB (ref 13.0–17.0)
MCH: 30.2 pg (ref 26.0–34.0)
MCHC: 32.7 g/dL (ref 30.0–36.0)
MCV: 92.5 fL (ref 78.0–100.0)
Platelets: 204 10*3/uL (ref 150–400)
RBC: 2.12 MIL/uL — ABNORMAL LOW (ref 4.22–5.81)
RDW: 14.8 % (ref 11.5–15.5)
WBC: 11.7 10*3/uL — ABNORMAL HIGH (ref 4.0–10.5)

## 2016-06-28 LAB — GLUCOSE, CAPILLARY
GLUCOSE-CAPILLARY: 194 mg/dL — AB (ref 65–99)
Glucose-Capillary: 178 mg/dL — ABNORMAL HIGH (ref 65–99)
Glucose-Capillary: 170 mg/dL — ABNORMAL HIGH (ref 65–99)
Glucose-Capillary: 237 mg/dL — ABNORMAL HIGH (ref 65–99)

## 2016-06-28 LAB — IRON AND TIBC
Iron: 11 ug/dL — ABNORMAL LOW (ref 45–182)
SATURATION RATIOS: 5 % — AB (ref 17.9–39.5)
TIBC: 221 ug/dL — AB (ref 250–450)
UIBC: 210 ug/dL

## 2016-06-28 LAB — URINE CULTURE: Culture: NO GROWTH

## 2016-06-28 LAB — FERRITIN: Ferritin: 436 ng/mL — ABNORMAL HIGH (ref 24–336)

## 2016-06-28 LAB — NO BLOOD PRODUCTS

## 2016-06-28 MED ORDER — ENOXAPARIN SODIUM 40 MG/0.4ML ~~LOC~~ SOLN
40.0000 mg | SUBCUTANEOUS | Status: DC
  Administered 2016-06-28 – 2016-07-03 (×6): 40 mg via SUBCUTANEOUS
  Filled 2016-06-28 (×6): qty 0.4

## 2016-06-28 MED ORDER — VANCOMYCIN HCL IN DEXTROSE 1-5 GM/200ML-% IV SOLN
1000.0000 mg | Freq: Two times a day (BID) | INTRAVENOUS | Status: DC
  Administered 2016-06-28 – 2016-06-29 (×2): 1000 mg via INTRAVENOUS
  Filled 2016-06-28 (×3): qty 200

## 2016-06-28 MED ORDER — SODIUM CHLORIDE 0.9 % IV SOLN
510.0000 mg | Freq: Once | INTRAVENOUS | Status: AC
  Administered 2016-06-28: 510 mg via INTRAVENOUS
  Filled 2016-06-28: qty 17

## 2016-06-28 MED ORDER — SENNA 8.6 MG PO TABS
1.0000 | ORAL_TABLET | Freq: Once | ORAL | Status: DC
  Filled 2016-06-28 (×2): qty 1

## 2016-06-28 MED ORDER — OXYCODONE-ACETAMINOPHEN 5-325 MG PO TABS
1.0000 | ORAL_TABLET | Freq: Three times a day (TID) | ORAL | Status: DC | PRN
  Administered 2016-06-28 – 2016-07-03 (×10): 1 via ORAL
  Filled 2016-06-28 (×11): qty 1

## 2016-06-28 MED ORDER — POLYETHYLENE GLYCOL 3350 17 G PO PACK
17.0000 g | PACK | Freq: Two times a day (BID) | ORAL | Status: DC
  Administered 2016-07-01: 17 g via ORAL
  Filled 2016-06-28 (×6): qty 1

## 2016-06-28 MED ORDER — MORPHINE SULFATE (PF) 4 MG/ML IV SOLN
2.0000 mg | INTRAVENOUS | Status: DC | PRN
  Administered 2016-06-29 – 2016-07-02 (×8): 2 mg via INTRAVENOUS
  Filled 2016-06-28 (×10): qty 1

## 2016-06-28 NOTE — Progress Notes (Signed)
Subjective: Feeling alright this morning. Persistent dizziness, fatigue, worked with PT this AM but still requiring a great deal of assistance. Chest discomfort unchanged. Fell yesterday evening and hit his head, had mild headache that resolved with Tylenol, no neuro deficits and headache resolved. Reports loss of appetite, abdominal bloating, and no BM in several days.  Objective: Vital signs in last 24 hours: Vitals:   06/27/16 2144 06/28/16 0404 06/28/16 0546 06/28/16 0945  BP: 126/71  134/76 (!) 142/78  Pulse: 82  93   Resp: 18  18   Temp: 97.8 F (36.6 C)  99.1 F (37.3 C)   TempSrc: Oral  Oral   SpO2: 96%  91%   Weight:  212 lb 8.4 oz (96.4 kg)    Height:        Intake/Output Summary (Last 24 hours) at 06/28/16 1235 Last data filed at 06/28/16 1109  Gross per 24 hour  Intake              700 ml  Output             1500 ml  Net             -800 ml   Physical Exam General appearance: Young-appearing gentleman resting in bed, in no apparent distress, conversational and polite HENT: Normocephalic, atraumatic, moist mucous membranes Cardiovascular: Regular rate and rhythm, no murmurs, rubs, gallops Respiratory/Chest: Clear to ausculation bilaterally, normal work of breathing Abdomen: Soft, moderately distended, mild generalized tenderness to palpation Extremities: s/p left transmetatarsal amputation, wound vac in place over foot stump, resolved erythema of foot/ankle/calf, some LLE swelling remains Skin: Warm, dry, intact - stable area of ecchymoses over left posterior distal thigh Psych: Normal affect  Labs / Imaging / Procedures: CBC Latest Ref Rng & Units 06/28/2016 06/27/2016 06/26/2016  WBC 4.0 - 10.5 K/uL 11.7(H) 10.0 8.8  Hemoglobin 13.0 - 17.0 g/dL 6.4(LL) 6.5(LL) 7.1(L)  Hematocrit 39.0 - 52.0 % 19.6(L) 19.5(L) 21.4(L)  Platelets 150 - 400 K/uL 204 166 192   BMP Latest Ref Rng & Units 06/27/2016 06/26/2016 06/25/2016  Glucose 65 - 99 mg/dL 222(H) 234(H) 237(H)  BUN  6 - 20 mg/dL 26(H) 31(H) 27(H)  Creatinine 0.61 - 1.24 mg/dL 1.47(H) 1.60(H) 1.31(H)  Sodium 135 - 145 mmol/L 136 136 139  Potassium 3.5 - 5.1 mmol/L 4.9 5.0 4.5  Chloride 101 - 111 mmol/L 106 107 107  CO2 22 - 32 mmol/L 25 24 26   Calcium 8.9 - 10.3 mg/dL 8.1(L) 7.8(L) 8.4(L)   Ct Angio Chest Pe W Or Wo Contrast  Result Date: 06/27/2016 CLINICAL DATA:  Hypoxia, tachycardia. Upper chest pain. Symptoms x 1 week. EXAM: CT ANGIOGRAPHY CHEST WITH CONTRAST TECHNIQUE: Multidetector CT imaging of the chest was performed using the standard protocol during bolus administration of intravenous contrast. Multiplanar CT image reconstructions and MIPs were obtained to evaluate the vascular anatomy. CONTRAST:  100 mL Isovue 370 IV COMPARISON:  06/13/2016 FINDINGS: Cardiovascular: Right arm IV contrast administration. SVC patent. Right atrium nondilated. Right ventricle nondilated. Satisfactory opacification of pulmonary arteries noted, and there is no evidence of pulmonary emboli. Patent bilateral pulmonary veins drain into the left atrium. Scattered coronary calcifications. Adequate contrast opacification of the thoracic aorta with no evidence of dissection, aneurysm, or stenosis. There is bovine variant brachiocephalic arch anatomy without proximal stenosis. Mediastinum/Nodes: No enlarged mediastinal, hilar, or axillary lymph nodes. Thyroid gland, trachea, and esophagus demonstrate no significant findings. Lungs/Pleura: Relatively low volumes. Patchy airspace consolidation/ atelectasis in both lower lobes,  new since prior study. Some linear scarring/ atelectasis in the lingula inferiorly, new since previous. No pleural effusion. No pneumothorax. Upper Abdomen: No acute abnormality. Musculoskeletal: No chest wall abnormality. No acute or significant osseous findings. Obesity. Review of the MIP images confirms the above findings. IMPRESSION: 1. Negative for acute PE or thoracic aortic dissection. 2. Patchy areas of  bilateral lower lobe atelectasis/consolidation, new since previous exam. Electronically Signed   By: Lucrezia Europe M.D.   On: 06/27/2016 16:18   Assessment/Plan:  Shane [name] is a 46 y.o. man with PMH HTN, dCHF, DM2, HLD, anemia, osteomyelitis s/p amputation of left 3rd toe on 06/09/2016 admitted for cellulitis of his left lower leg and foot.  Pleuritic chest pain, worsening this AM with dyspnea,  O2 requirement of 2L via nasal cannula on 1/11. Concerning for demand ischemia in setting of anemia (HB 6.5) vs pneumonia vs pulmonary embolism. Troponins 0.08 > 0.07. CXR with LLL atelectasis vs pneumonia, CTA chest showed no PE. EKG NSR. - supplemental O2 as needed, maintain O2 sats > 92%  Severe LLE cellulitis and osteomyelitis of 2nd and 3rd metatarsal heads Patient with rapidly spreading erythema and swelling of left LE two weeks following toe amputation and full course of doxycycline for osteomyelitis. Patient with subjective fevers and chills, though afebrile here. Site of surgery looks well; there is local violaceous region on dorsum of left foot that is separate from rest of erythema and edema that could be concerning for abscess. Foot XR does not show evidence of recurrent osteomyelitis. Patient does have Well's score of 3, venous doppler prelim read negative for DVT. History of MRSA colonization, also T2DM moderately well controlled. Tight swelling of foot/ankle and severe pain seems out of proportion to cellulitis, concerning for possible underlying abscess or impending compartment syndrome. MRI on 11/8 showed severe cellulitis, myofasciitis, small abscesses adjacent to 2nd/3rd metatarsal heads and changes concerning for osteomyelitis. Underwent transmetatarsal amputation on 1/10 with Dr. Sharol Given. -- Continue Vanc / Zosyn per pharmacy -- [place] appreciated, to follow up with Dr. Sharol Given -- Oxycodone-APAP 5-325 mg Q8H PRN for moderate to severe pain -- Morphine 2mg  IV Q4H PRN for severe  pain -- Zofran 4mg  Q6H PRN for nausea -- blood cultures drawn 1/8 NGTD, 1/11 pending -- PT eval and recommend SNF  T2DM with gastroparesis and peripheral neuropathy, last A1c 8.7 in 05/2016. Patient on home regimen of metformin 1000mg  BID, glipizide 5mg  daily and Lantus 10U qhs. CBGs elevated in 200s -- Hold metformin and glipizide -- Assess control on Lantus 12 units QD -- moderate SSI --continue reglan and gabapentin   Constipation, persistent LBM 06/24/16 - had reported no BM 5 days, attributed to pain medications, abdominal distension with generalized tenderness again. Persistent issue -- 1x extra dose of Senna this AM -- Senokot-S 2 tablet QHS -- MiraLax BID Scheduled  AKI, mild. Creatinine1.42 on admission from baseline 1.0 as well as new onset of urgency/difficulty initiating stream likely due to volume depletion. Cr now 1.8 despite IV fluid rehydration, may be renal injury secondary to IV antibiotics. -- Abstaining from trending BMP, minimizing lab draws  Symptomatic anemia, steady around Hb 6.5, normocytic, from 9.7 earlier this admission (baseline around 10). Asymptomatic. Elevated ferritin in 01/2016. Likely anemia of chronic disease due to recurrent infections and dilutional component this hospital stay from IV fluids -- Stable Hb, hold lab draws to minimize iatrogenic blood loss -- Folate and B12 supplement daily -- Recheck ferritin and iron panel today - remains consistent  with ACD -- Will provide 1x dose Feraheme -- Next check CBC on 06/30/16 -- Patient is Jehovah's Witness, NO transfusions.  -- May accept albumin-free EPO, not available on formulary  HTN, resolved with starting home medications. Patient on home regimen of amlodipine 10mg  daily, lisinopril 40mg  daily and coreg 12.5mg  BID.  --continue home amlodipine 10mg  daily and coreg 12.5mg  BID --hold lisinopril 40mg  daily in setting of AKI, can restart on d/c  Chronic diastolic heart failure, LVEF 55-60% with grade  2 diastolic dysfunction in [id number]. Home regimen of lisinopril 40mg  daily, coreg 12.5mg  BID; on exam he appears euvolemic. He received 500cc NS bolus for AKI and mild increase in lactic acid that resolved with fluids. --monitor fluid status  Bipolar disorder, patient on home regimen of depakote and fluoxetine. --continue depakote 1000mg  daily and fluoxetine 20mg  daily.  DVT: restart Lovenox today  Dispo: Anticipated discharge in approximately 2-3 day(s).   LOS: 4 days   Asencion Partridge, MD 06/28/2016, 12:35 PM Pager: [phone]

## 2016-06-28 NOTE — Progress Notes (Signed)
Pharmacy Antibiotic Note  Shane [name] is a 46 y.o. male admitted on 06/23/2016 with cellulitis and toe osteo. The patient underwent a TMA on 1/10 and has noted improvement in cellulitis. Post-op the patient was noted to have fever spikes so antibiotics have continued. Pharmacy is on board for Vancomycin + Zosyn dosing.  SCr 1.47 << 1.6, CrCl~60-70 ml/min. Will adjust Vancomycin today for improving renal function.   Plan: 1. Adjust Vancomycin to 1g IV every 12 hours 2. Continue Zosyn 3.375g/8h EI 3. Will continue to follow renal function, culture results, LOT, and antibiotic de-escalation plans   Height: 5\' 7"  (170.2 cm) Weight: 212 lb 8.4 oz (96.4 kg) IBW/kg (Calculated) : 66.1  Temp (24hrs), Avg:98.5 F (36.9 C), Min:97.8 F (36.6 C), Max:99.1 F (37.3 C)   Recent Labs Lab 06/23/16 2346 06/24/16 0127 06/24/16 0715 06/25/16 0754 06/26/16 0331 06/26/16 0601 06/27/16 0457 06/28/16 0407  WBC 8.8  --   --  8.4 8.4 8.8 10.0 11.7*  CREATININE 1.42*  --  1.18 1.31* 1.60*  --  1.47*  --   LATICACIDVEN 2.0* 0.8  --   --   --   --   --   --     Estimated Creatinine Clearance: 68.7 mL/min (by C-G formula based on SCr of 1.47 mg/dL (H)).    Allergies  Allergen Reactions  . Lactose Intolerance (Gi) Diarrhea and Other (See Comments)    bloating  . Other Other (See Comments)    Red meat causes stomach pains, bloating and diarrhea  . Milk-Related Compounds Diarrhea and Other (See Comments)    Any dairy products  - diarrhea and bloating    Antimicrobials this admission:  Vanc 1/8>> Zosyn 1/8>>  Dose adjustments this admission:  1/10 vanc dose change 1/12 Dose adj for renal fxn  Microbiology results:  1/8 BC x2>>ngtd 1/10 MRSA pcr neg, MSSA pos 1/11 UCx >> neg 1/11 BCx2>>  Thank you for allowing pharmacy to be a part of this patient's care.  Alycia Rossetti, PharmD, BCPS Clinical Pharmacist Pager: [phone] Clinical phone for 06/28/2016 from 7a-3:30p: [phone] If after  3:30p, please call main pharmacy at: [phone] 06/28/2016 12:38 PM

## 2016-06-28 NOTE — Progress Notes (Signed)
Pharmacy: Lovenox for VTE prophylaxis  OBJECTIVE:  Wt: 96.4 kg   Ht: 67 inches  BMI~33 SCr 1.47 CrCl~60-70 ml/min  ASSESSMENT:  38 YOM to start lovenox for VTE prophylaxis. Normally would require dose adjustment to 0.5 mg/kg for BMI>30 however will start conservatively at only 40 mg given the fact that the patient is a Jehovah's Witness, BMI is only 33, and Hgb is currently 6.4. The patient will need to monitored closely. Noted CTA checked 1/11 and was negative for PE.   PLAN:  1. Start Lovenox 40 mg SQ every 24 hours 2. Pharmacy will monitor peripherally for s/sx of bleeding and any necessary dose adjustments   Thank you for allowing pharmacy to be a part of this patient's care.  Alycia Rossetti, PharmD, BCPS Clinical Pharmacist Pager: [phone] Clinical phone for 06/28/2016 from 7a-3:30p: [phone] If after 3:30p, please call main pharmacy at: [phone] 06/28/2016 12:17 PM

## 2016-06-28 NOTE — Consult Note (Addendum)
WOC consult requested to assist with troubleshooting Prevena Vac machine to left foot.  Vac dressing was applied in the OR on 1/10 by Dr Sharol Given, according to the EMR.  The battery is no longer functioning and Prevena machines do not allow it to be changed.  Dressing left in place and applied a new Prevena machine.  It is "revving" a large amount although dressing appears to be intact with a good seal.  If this second machine is no longer functioning later, then bedside nurse should call Dr Sharol Given for further wound care orders. Please re-consult if further assistance is needed.  Thank-you,  Julien Girt MSN, [place], [place], [place], [place]

## 2016-06-28 NOTE — Progress Notes (Signed)
Physical Therapy Treatment Patient Details Name: Shane [name] MRN: [id number] DOB: 1971-02-07 Today's Date: 06/28/2016    History of Present Illness Shane [name] is a 45 y.o. male admitted on 06/23/2016 with cellulitis.  Pharmacy has been consulted for vancomycin and zosyn dosing.  S/p trans-met amputation. PMH DMII, HTN, bipolar, dyspnea, and chest pain     PT Comments    Pt limited by dizziness today. Pt with good UE strength to mobilize to EOB but upon sitting up pt c/o dizziness and sensations of the room spinning. Was not able to take immediate BP as BP cuff was missing from room. BP 142/78 after 4 minutes of sitting. Pt able to preform sit<>stand transfer with Min A and good UE strength to push up to stand. 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 Recommending SNF to allow increased time to heal before return to home to reduce caregiver burden. PT will continue to follow acutely.   Follow Up Recommendations  SNF;Supervision for mobility/OOB (pt aware that therapy is not covered)     Equipment Recommendations  Wheelchair (measurements PT);Wheelchair cushion (measurements PT) (18"x18", antitippers, pressure reliving cushion) desk arms, elevating leg rests   Recommendations for Other Services       Precautions / Restrictions Precautions Precautions: Fall Restrictions Weight Bearing Restrictions: No LLE Weight Bearing: Touchdown weight bearing    Mobility  Bed Mobility Overal bed mobility: Needs Assistance Bed Mobility: Supine to Sit     Supine to sit: Min assist     General bed mobility comments: Min A for LE mobility OOB. Good UE strength to push up into sitting. Pt c/o dizziness upon sitting EOB. BP cuff initially missing from room so was not able to take immediate BP. Pt c/o room spinning  Transfers Overall transfer level: Needs assistance Equipment used: Rolling walker  (2 wheeled) Transfers: Sit to/from Stand Sit to Stand: Min assist Stand pivot transfers: Min assist       General transfer comment: Pt with good UE strength to push up into standing. Min A to power up to stand and maintain standing balance. VC to maintain TWB status- pt stated "I forgot." Pt able to take small steps to pivot to chair.   Ambulation/Gait                 Stairs            Wheelchair Mobility    Modified Rankin (Stroke Patients Only)       Balance Overall balance assessment: Needs assistance Sitting-balance support: No upper extremity supported;Feet supported Sitting balance-Leahy Scale: Good Sitting balance - Comments: Able to sit EOB without UE assist   Standing balance support: Bilateral upper extremity supported;During functional activity Standing balance-Leahy Scale: Poor Standing balance comment: Reliant on UE to maintain TWB status                    Cognition Arousal/Alertness: Lethargic Behavior During Therapy: Flat affect Overall Cognitive Status: No family/caregiver present to determine baseline cognitive functioning                      Exercises Other Exercises Other Exercises: x1 vestibular exercises in sitting- 3x/day    General Comments        Pertinent Vitals/Pain Pain Assessment: 0-10 Pain Score: 10-Worst pain ever Pain Location: surgical site Pain Descriptors / Indicators: Aching;Grimacing;Operative site guarding Pain Intervention(s): Limited activity  within patient's tolerance;Monitored during session;RN gave pain meds during session;Repositioned   BP142/78 Home Living                      Prior Function            PT Goals (current goals can now be found in the care plan section) Acute Rehab PT Goals Patient Stated Goal: to walk PT Goal Formulation: With patient Time For Goal Achievement: 07/11/16 Potential to Achieve Goals: Good Progress towards PT goals: Progressing toward  goals    Frequency    Min 3X/week      PT Plan Current plan remains appropriate    Co-evaluation             End of Session Equipment Utilized During Treatment: Gait belt;Oxygen Activity Tolerance: Patient limited by pain;Patient limited by lethargy (Limited by dizziness) Patient left: in chair;with call bell/phone within reach;with chair alarm set     Time: 0919-0950 PT Time Calculation (min) (ACUTE ONLY): 31 min  Charges:  $Therapeutic Exercise: 8-22 mins $Therapeutic Activity: 8-22 mins                    G Codes:      Tonia Brooms July 16, 2016, 12:25 PM Tonia Brooms, SPT [phone]

## 2016-06-29 DIAGNOSIS — J9811 Atelectasis: Secondary | ICD-10-CM

## 2016-06-29 DIAGNOSIS — J9589 Other postprocedural complications and disorders of respiratory system, not elsewhere classified: Secondary | ICD-10-CM

## 2016-06-29 DIAGNOSIS — I10 Essential (primary) hypertension: Secondary | ICD-10-CM

## 2016-06-29 DIAGNOSIS — Z9981 Dependence on supplemental oxygen: Secondary | ICD-10-CM

## 2016-06-29 LAB — CULTURE, BLOOD (ROUTINE X 2)
Culture: NO GROWTH
Culture: NO GROWTH

## 2016-06-29 LAB — GLUCOSE, CAPILLARY
GLUCOSE-CAPILLARY: 161 mg/dL — AB (ref 65–99)
GLUCOSE-CAPILLARY: 178 mg/dL — AB (ref 65–99)
GLUCOSE-CAPILLARY: 135 mg/dL — AB (ref 65–99)
GLUCOSE-CAPILLARY: 169 mg/dL — AB (ref 65–99)

## 2016-06-29 MED ORDER — INSULIN GLARGINE 100 UNIT/ML ~~LOC~~ SOLN
15.0000 [IU] | Freq: Every day | SUBCUTANEOUS | Status: DC
  Administered 2016-06-29 – 2016-07-01 (×3): 15 [IU] via SUBCUTANEOUS
  Filled 2016-06-29 (×4): qty 0.15

## 2016-06-29 MED ORDER — CEPHALEXIN 500 MG PO CAPS
500.0000 mg | ORAL_CAPSULE | Freq: Four times a day (QID) | ORAL | Status: AC
  Administered 2016-06-29 – 2016-07-01 (×11): 500 mg via ORAL
  Filled 2016-06-29 (×11): qty 1

## 2016-06-29 NOTE — Progress Notes (Signed)
Subjective:  No acute events overnight.  Had some nausea earlier, and headache and some diarrhea. Feels very weak.  He had to go up to 4 L on [place] and satting at 92%, per nursing.  Denies any pain. Had no appetite yesterday, but feels like eating.   Objective: Vital signs in last 24 hours: Vitals:   06/28/16 1401 06/28/16 2106 06/28/16 2236 06/29/16 0547  BP: 137/73 132/71  133/85  Pulse: 83 95  88  Resp: 18 18  18   Temp: 98 F (36.7 C)   98.4 F (36.9 C)  TempSrc: Oral Oral  Oral  SpO2: 95% 90%  90%  Weight:   220 lb 10.9 oz (100.1 kg)   Height:        Intake/Output Summary (Last 24 hours) at 06/29/16 0934 Last data filed at 06/28/16 1802  Gross per 24 hour  Intake              710 ml  Output             1000 ml  Net             -290 ml   Physical Exam General appearance: Young-appearing gentleman resting in bed, feeling weak, , conversational and polite Cardiovascular: Regular rate and rhythm, no murmurs, rubs, gallops Respiratory/Chest: Clear to ausculation bilaterally, normal work of breathing Abdomen: Soft, non tender nondistended, normal bowel sounds Extremities: s/p left transmetatarsal amputation, wound vac in place over foot stump, resolved erythema of foot/ankle/calf, some LLE swelling remains Skin: Warm, dry, intact -  Labs / Imaging / Procedures: CBC Latest Ref Rng & Units 06/28/2016 06/27/2016 06/26/2016  WBC 4.0 - 10.5 K/uL 11.7(H) 10.0 8.8  Hemoglobin 13.0 - 17.0 g/dL 6.4(LL) 6.5(LL) 7.1(L)  Hematocrit 39.0 - 52.0 % 19.6(L) 19.5(L) 21.4(L)  Platelets 150 - 400 K/uL 204 166 192   BMP Latest Ref Rng & Units 06/27/2016 06/26/2016 06/25/2016  Glucose 65 - 99 mg/dL 222(H) 234(H) 237(H)  BUN 6 - 20 mg/dL 26(H) 31(H) 27(H)  Creatinine 0.61 - 1.24 mg/dL 1.47(H) 1.60(H) 1.31(H)  Sodium 135 - 145 mmol/L 136 136 139  Potassium 3.5 - 5.1 mmol/L 4.9 5.0 4.5  Chloride 101 - 111 mmol/L 106 107 107  CO2 22 - 32 mmol/L 25 24 26   Calcium 8.9 - 10.3 mg/dL 8.1(L) 7.8(L)  8.4(L)   No results found. Assessment/Plan:  Shane [name] is a 46 y.o. man with PMH HTN, dCHF, DM2, HLD, anemia, osteomyelitis s/p amputation of left 3rd toe on 06/09/2016 admitted for cellulitis of his left lower leg and foot.  Severe LLE cellulitis and osteomyelitis of 2nd and 3rd metatarsal heads: Patient is POD3 s/p left transmetatarsal amputation. Wound vac dressing is clean, dry. Being followed by orthopedics. Blood cultures from 1/8 continue to show NGTD. From 1/10 is still in process. MRSA nares positive.  -appreciate ortho recs -stopped vanc and zosyn and switched to Po keflex  -norco PRN -morphine PRN -zofran PRn -going to SNF.  New oxygen requirement: Multifactorial. Postop atelectasis and anemia.  Tonight patient was on 4L at 92%, which went up from 2L. Earlier a CT angio was done which ruled out PE but showed some atelectasis. EKG had shown NSR. Troponins unremarkable. CXR could not exclude PNA however pt already received vanc and zosyn. Could be due to anemia causing increased oxygen requirement.   -continue to monitor oxygen requirement.  -maintain O2 sats above 92% -OOB and in the chair  -spirometer    T2DM with gastroparesis  and peripheral neuropathy, last A1c 8.7 in 05/2016. Patient on home regimen of metformin 1000mg  BID, glipizide 5mg  daily and Lantus 10U qhs. CBGs 150-200s  -increased lantus to 15 units daily  -SSI-M -reglan and gabapentin  Symptomatic anemia, steady around Hb 6.5, normocytic, from 9.7 earlier this admission (baseline around 10). Elevated ferritin in 01/2016. Likely anemia of chronic disease due to recurrent infections and dilutional component this hospital stay from IV fluids. Minimal blood loss in surgery. Ferritin elevated at 460. Given 1 dose of feraheme. Pt is Jehovas witness- no transfusions  -- Stable Hb at 6.5 as of 1/12, we are minimizing blood draws- next one 1/14 -- Folate and B12 supplement daily -- Next check CBC on 06/30/16 -- May  accept albumin-free EPO, not available on formulary  HTN, resolved with starting home medications. Patient on home regimen of amlodipine 10mg  daily, lisinopril 40mg  daily and coreg 12.5mg  BID.  --continue home amlodipine 10mg  daily and coreg 12.5mg  BID --hold lisinopril 40mg  daily in setting of AKI, can restart on d/c  Bipolar disorder, patient on home regimen of depakote and fluoxetine. --continue depakote 1000mg  daily and fluoxetine 20mg  daily.  DVT: Lovenox   Dispo: Anticipated discharge in approximately 2-3 days pending SNF  LOS: 5 days   Burgess Estelle, MD 06/29/2016, 9:34 AM

## 2016-06-29 NOTE — Progress Notes (Signed)
PHARMACY NOTE:  ANTIMICROBIAL RENAL DOSAGE ADJUSTMENT  Current antimicrobial regimen includes a mismatch between antimicrobial dosage and estimated renal function.  As per policy approved by the Pharmacy & Therapeutics and Medical Executive Committees, the antimicrobial dosage will be adjusted accordingly.  Antimicrobial dosage that was originally prescribed (encountered in verification queue): keflex 500 mg PO q8h  Indication: cellulitis  Renal Function:   Estimated Creatinine Clearance: 70 mL/min (by C-G formula based on SCr of 1.47 mg/dL (H)). []      On intermittent HD, scheduled: []      On CRRT    Antimicrobial dosage has been changed to:  Keflex 500 mg q6h  Additional comments: Pt was transitioned from vanc/zosyn to keflex today (1/13). The original order for keflex PO 500 q8h was modified to q6h prior to verification.   Thank you for allowing pharmacy to be a part of this patient's care.  Carlean Jews, Pharm.D. PGY1 Pharmacy Resident 1/13/201811:21 AM Pager [phone]

## 2016-06-29 NOTE — Progress Notes (Signed)
Prevena 125 completely shut off and not working.  Called Dr. Marlou Sa about situation and he stated to apply regular wound vac at 125.  Order placed and portable called for machine.

## 2016-06-29 NOTE — Progress Notes (Signed)
Physical Therapy Treatment Patient Details Name: Shane [name] MRN: [id number] DOB: Sep 14, 1970 Today's Date: 06/29/2016    History of Present Illness Shane [name] is a 46 y.o. male admitted on 06/23/2016 with cellulitis.  Pharmacy has been consulted for vancomycin and zosyn dosing.  S/p trans-met amputation. PMH DMII, HTN, bipolar, dyspnea, and chest pain     PT Comments    Patient progressing with ambulation and able to maintain weight bearing.  Still reports significant pain with swelling noted L LE.  Also reports continued constant dizziness that is 10/10 (though non verbals not suggesting that severity.)  Did perform adaptation exercises with some increased symptoms.  Will need continued 24 hour assist as high fall risk with limited weight bearing and dizziness.  Follow Up Recommendations  SNF;Supervision for mobility/OOB     Equipment Recommendations  Wheelchair (measurements PT);Wheelchair cushion (measurements PT) (18x18, w/c with cushion)    Recommendations for Other Services       Precautions / Restrictions Precautions Precautions: Fall Precaution Comments: O2 dependent Restrictions LLE Weight Bearing: Touchdown weight bearing    Mobility  Bed Mobility Overal bed mobility: Needs Assistance Bed Mobility: Supine to Sit     Supine to sit: Supervision     General bed mobility comments: for safety, line management  Transfers Overall transfer level: Needs assistance Equipment used: Rolling walker (2 wheeled) Transfers: Sit to/from Stand Sit to Stand: Min guard         General transfer comment: maintaining NWB L with sit to stand, though reported dizziness 10/10 able to stand without much complaint or LOB  Ambulation/Gait Ambulation/Gait assistance: Min assist Ambulation Distance (Feet): 50 Feet Assistive device: Rolling walker (2 wheeled) Gait Pattern/deviations: Step-to pattern;Decreased stride length     General Gait Details: keeping NWB mostly on L, but  occasional touch down for balance, min A for safety/balance and chair following.  Able to push distance with cues   Stairs            Wheelchair Mobility    Modified Rankin (Stroke Patients Only)       Balance Overall balance assessment: Needs assistance   Sitting balance-Leahy Scale: Good     Standing balance support: Bilateral upper extremity supported Standing balance-Leahy Scale: Poor Standing balance comment: UE support for balance keeping L LE NWB or TWB                    Cognition Arousal/Alertness: Awake/alert Behavior During Therapy: WFL for tasks assessed/performed Overall Cognitive Status: Within Functional Limits for tasks assessed                      Exercises Other Exercises Other Exercises: performed x1 gaze stability exercises seated on edge of chair no UE support x 30 sec with head side to side with dizziness worse, then 30 sec head up and down with less report of dizziness.    General Comments General comments (skin integrity, edema, etc.): notes SpO2 after O2 removed fluctuated 84% to 94%, therefore ambulation on 3L O2; reports increased swelling L foot since wound nurse added tape to dressing; also noted wound vac making loud noise with function and may need adjustment; RN aware      Pertinent Vitals/Pain Pain Score: 10-Worst pain ever Pain Location: L foot Pain Descriptors / Indicators: Discomfort;Tightness Pain Intervention(s): Monitored during session;Repositioned    Home Living  Prior Function            PT Goals (current goals can now be found in the care plan section) Progress towards PT goals: Progressing toward goals    Frequency    Min 3X/week      PT Plan Current plan remains appropriate    Co-evaluation             End of Session Equipment Utilized During Treatment: Gait belt;Oxygen Activity Tolerance: Patient limited by pain;Patient limited by lethargy Patient  left: in chair;with call bell/phone within reach;with chair alarm set     Time: 5488-4573 PT Time Calculation (min) (ACUTE ONLY): 24 min  Charges:  $Gait Training: 8-22 mins $Neuromuscular Re-education: 8-22 mins                    G Codes:      Reginia Naas 07-10-2016, 11:01 AM  Magda Kiel, [place] 07-10-16

## 2016-06-30 LAB — CBC
HCT: 18.1 % — ABNORMAL LOW (ref 39.0–52.0)
HEMOGLOBIN: 5.9 g/dL — AB (ref 13.0–17.0)
MCH: 30.1 pg (ref 26.0–34.0)
MCHC: 32.6 g/dL (ref 30.0–36.0)
MCV: 92.3 fL (ref 78.0–100.0)
Platelets: 278 10*3/uL (ref 150–400)
RBC: 1.96 MIL/uL — AB (ref 4.22–5.81)
RDW: 15.1 % (ref 11.5–15.5)
WBC: 10.3 10*3/uL (ref 4.0–10.5)

## 2016-06-30 LAB — GLUCOSE, CAPILLARY
GLUCOSE-CAPILLARY: 132 mg/dL — AB (ref 65–99)
GLUCOSE-CAPILLARY: 273 mg/dL — AB (ref 65–99)
GLUCOSE-CAPILLARY: 131 mg/dL — AB (ref 65–99)
GLUCOSE-CAPILLARY: 157 mg/dL — AB (ref 65–99)

## 2016-06-30 NOTE — Progress Notes (Signed)
Wound VAC at 125 circle pulled off, obtained new one and placed and reinforced, no leak detected.

## 2016-06-30 NOTE — Progress Notes (Signed)
Received critical hemoglobin report, Dr. Lovena Le notified. Patient alert, denies SOB/chest pain, scant output from NPWT(dressing D/I).  Patient given PRN pain medicine per request for foot pain.

## 2016-06-30 NOTE — Progress Notes (Signed)
Subjective:  No acute events overnight.  He has been using the spirometer and was OOB in chair yesterday   Objective: Vital signs in last 24 hours: Vitals:   06/29/16 1336 06/29/16 2034 06/30/16 0456 06/30/16 0500  BP: 136/74 129/69 135/87   Pulse: 87 88 84   Resp: 20 19 18    Temp: 98.1 F (36.7 C) 98 F (36.7 C) 99 F (37.2 C)   TempSrc: Oral Oral Oral   SpO2: 99% 99% (!) 89% 91%  Weight:      Height:        Intake/Output Summary (Last 24 hours) at 06/30/16 1052 Last data filed at 06/29/16 1920  Gross per 24 hour  Intake                0 ml  Output              600 ml  Net             -600 ml   Physical Exam General appearance: Young-appearing gentleman resting in bed, feeling weak, , conversational and polite Cardiovascular: Regular rate and rhythm, no murmurs, rubs, gallops Respiratory/Chest: Clear to ausculation bilaterally, normal work of breathing Abdomen: Soft, non tender nondistended, normal bowel sounds Extremities: s/p left transmetatarsal amputation, wound vac in place over foot stump, resolved erythema of foot/ankle/calf, some LLE swelling remains Skin: Warm, dry, intact -  Labs / Imaging / Procedures: CBC Latest Ref Rng & Units 06/30/2016 06/28/2016 06/27/2016  WBC 4.0 - 10.5 K/uL 10.3 11.7(H) 10.0  Hemoglobin 13.0 - 17.0 g/dL 5.9(LL) 6.4(LL) 6.5(LL)  Hematocrit 39.0 - 52.0 % 18.1(L) 19.6(L) 19.5(L)  Platelets 150 - 400 K/uL 278 204 166   BMP Latest Ref Rng & Units 06/27/2016 06/26/2016 06/25/2016  Glucose 65 - 99 mg/dL 222(H) 234(H) 237(H)  BUN 6 - 20 mg/dL 26(H) 31(H) 27(H)  Creatinine 0.61 - 1.24 mg/dL 1.47(H) 1.60(H) 1.31(H)  Sodium 135 - 145 mmol/L 136 136 139  Potassium 3.5 - 5.1 mmol/L 4.9 5.0 4.5  Chloride 101 - 111 mmol/L 106 107 107  CO2 22 - 32 mmol/L 25 24 26   Calcium 8.9 - 10.3 mg/dL 8.1(L) 7.8(L) 8.4(L)   No results found. Assessment/Plan:  Shane [name] is a 46 y.o. man with PMH HTN, dCHF, DM2, HLD, anemia, osteomyelitis s/p amputation  of left 3rd toe on 06/09/2016 admitted for cellulitis of his left lower leg and foot.  Severe LLE cellulitis and osteomyelitis of 2nd and 3rd metatarsal heads: Patient is POD4 s/p left transmetatarsal amputation. Wound vac dressing is clean, dry. Being followed by orthopedics. Blood cultures from 1/8 continue to show NGTD. From 1/11 is NGTD. MRSA nares positive.  -ordered LE dopplers  -appreciate ortho recs - Po keflex q6 hours -norco PRN -morphine PRN -zofran PRn -going to SNF.  New oxygen requirement: Multifactorial. Postop atelectasis and anemia.  Tonight patient was on 4L-->3L at 92%. Earlier a CT angio was done which ruled out PE but showed some atelectasis. EKG had shown NSR. Troponins unremarkable. Could be due to anemia causing increased oxygen requirement.  Pt started using spirometer and has been OOB   -continue to monitor oxygen requirement.  -maintain O2 sats above 92% -OOB and in the chair  -spirometer    T2DM with gastroparesis and peripheral neuropathy, last A1c 8.7 in 05/2016. Patient on home regimen of metformin 1000mg  BID, glipizide 5mg  daily and Lantus 10U qhs. CBGs 150s  -lantus to 15 units daily  -SSI-M -reglan and gabapentin  Symptomatic  anemia, 9.7-->6.5--> 5.9  Today. Baseline around 10. Elevated ferritin in 01/2016. Likely anemia of chronic disease due to recurrent infections and dilutional component this hospital stay from IV fluids. Minimal blood loss in surgery. Ferritin elevated at 460. Given 1 dose of feraheme. Pt is Jehovas witness- no transfusions.  We will continue the prophylactic lovenox dose as pt is high risk of getting DVT.   -- Folate and B12 supplement daily -- Next check CBC on 07/02/16 -- May accept albumin-free EPO, not available on formulary  HTN, resolved with starting home medications. Patient on home regimen of amlodipine 10mg  daily, lisinopril 40mg  daily and coreg 12.5mg  BID.  --continue home amlodipine 10mg  daily and coreg 12.5mg   BID --hold lisinopril 40mg  daily in setting of AKI, can restart on d/c  Bipolar disorder, patient on home regimen of depakote and fluoxetine. --continue depakote 1000mg  daily and fluoxetine 20mg  daily.  DVT: Lovenox   Dispo: Anticipated discharge in approximately 2-3 days pending SNF  LOS: 6 days   Burgess Estelle, MD 06/30/2016, 10:52 AM

## 2016-07-01 ENCOUNTER — Inpatient Hospital Stay (HOSPITAL_COMMUNITY): Payer: Medicaid Other

## 2016-07-01 DIAGNOSIS — M7989 Other specified soft tissue disorders: Secondary | ICD-10-CM

## 2016-07-01 LAB — RETICULOCYTES
RBC.: 1.92 MIL/uL — ABNORMAL LOW (ref 4.22–5.81)
Retic Count, Absolute: 59.5 10*3/uL (ref 19.0–186.0)
Retic Ct Pct: 3.1 % (ref 0.4–3.1)

## 2016-07-01 LAB — CBC
HEMATOCRIT: 18.2 % — AB (ref 39.0–52.0)
Hemoglobin: 5.9 g/dL — CL (ref 13.0–17.0)
MCH: 30.4 pg (ref 26.0–34.0)
MCHC: 32.4 g/dL (ref 30.0–36.0)
MCV: 93.8 fL (ref 78.0–100.0)
PLATELETS: 326 10*3/uL (ref 150–400)
RBC: 1.94 MIL/uL — ABNORMAL LOW (ref 4.22–5.81)
RDW: 15.2 % (ref 11.5–15.5)
WBC: 11.3 10*3/uL — ABNORMAL HIGH (ref 4.0–10.5)

## 2016-07-01 LAB — GLUCOSE, CAPILLARY
GLUCOSE-CAPILLARY: 270 mg/dL — AB (ref 65–99)
Glucose-Capillary: 169 mg/dL — ABNORMAL HIGH (ref 65–99)
Glucose-Capillary: 158 mg/dL — ABNORMAL HIGH (ref 65–99)

## 2016-07-01 MED ORDER — INSULIN GLARGINE 100 UNIT/ML ~~LOC~~ SOLN
18.0000 [IU] | Freq: Every day | SUBCUTANEOUS | Status: DC
  Administered 2016-07-02 – 2016-07-03 (×2): 18 [IU] via SUBCUTANEOUS
  Filled 2016-07-01 (×2): qty 0.18

## 2016-07-01 NOTE — NC FL2 (Signed)
[place] LEVEL OF CARE SCREENING TOOL     IDENTIFICATION  Patient Name: Shane [name] Birthdate: Oct 18, 1970 Sex: male Admission Date (Current Location): 06/23/2016  [facility] and [place] Number:  Herbalist and Address:  [facility] [place], [place] [address] [place],  [address]      Provider Number: [id number]  Attending Physician Name and Address:  Sid Falcon, MD  Relative Name and Phone Number:       Current Level of Care: Hospital Recommended Level of Care: [place] Prior Approval Number:    Date Approved/Denied:   PASRR Number:    Discharge Plan: Home    Current Diagnoses: Patient Active Problem List   Diagnosis Date Noted  . Cellulitis 06/24/2016  . Cellulitis of left lower leg 06/24/2016  . Cellulitis of left lower extremity   . Subacute osteomyelitis, left ankle and foot ([place]) 06/08/2016  . Diabetic gastroparesis ([place]) 05/30/2016  . Chronic ulcer of left foot ([place]) 05/22/2016  . Dermatitis 04/26/2016  . Buzzing in ear, right 03/26/2016  . Joint pain 03/11/2016  . Weakness 03/08/2016  . Dizziness 03/03/2016  . Diabetic ulcer of right foot associated with type 2 diabetes mellitus, with necrosis of muscle ([place]) 02/21/2016  . Anemia, iron deficiency   . Chronic diastolic CHF (congestive heart failure), NYHA class 1 ([place]) 12/30/2015  . Diabetes mellitus type 2, uncontrolled, with complications ([place]) [id number]  . Depression with anxiety 12/01/2015  . Pain in the chest 12/01/2015  . Insomnia 11/21/2015  . GERD (gastroesophageal reflux disease) 08/18/2015  . Amputated toe of left foot ([place]) 08/18/2015  . Abscess of third toe, left 08/01/2015  . Erectile dysfunction 07/04/2015  . Anemia of infection and chronic disease 04/24/2015  . Left arm weakness 04/02/2015  . Sensory disturbance 04/02/2015  . Essential hypertension 04/02/2015  . Hemispheric carotid artery syndrome   . HLD (hyperlipidemia)    . Headache     Orientation RESPIRATION BLADDER Height & Weight     Self, Time, Situation, Place  O2 (3L O2) Continent Weight: 220 lb 10.9 oz (100.1 kg) Height:  5\' 7"  (170.2 cm)  BEHAVIORAL SYMPTOMS/MOOD NEUROLOGICAL BOWEL NUTRITION STATUS      Continent Diet (Carb Modified, Thin Liquids)  AMBULATORY STATUS COMMUNICATION OF NEEDS Skin   Limited Assist Verbally Surgical wounds, Wound Vac                       Personal Care Assistance Level of Assistance  Bathing, Dressing, Feeding Bathing Assistance: Limited assistance Feeding assistance: Independent Dressing Assistance: Limited assistance     Functional Limitations Info  Sight, Hearing, Speech Sight Info: Adequate Hearing Info: Adequate Speech Info: Adequate    SPECIAL CARE FACTORS FREQUENCY  PT (By licensed PT)                    Contractures Contractures Info: Not present    Additional Factors Info  Code Status, Allergies, Psychotropic Code Status Info: Full Code Allergies Info: Lactose Intolerance (Gi), Other, Milk-related Compounds Psychotropic Info: Prozac, Depakote         Current Medications (07/01/2016):  This is the current hospital active medication list Current Facility-Administered Medications  Medication Dose Route Frequency Provider Last Rate Last Dose  . 0.9 %  sodium chloride infusion   Intravenous Continuous Newt Minion, MD 10 mL/hr at 06/26/16 1143    . acetaminophen (TYLENOL) tablet 650 mg  650 mg Oral Q6H PRN  Newt Minion, MD   650 mg at 07/01/16 [id number]   Or  . acetaminophen (TYLENOL) suppository 650 mg  650 mg Rectal Q6H PRN Newt Minion, MD      . amLODipine (NORVASC) tablet 10 mg  10 mg Oral Daily Shela Leff, MD   10 mg at 07/01/16 1027  . aspirin EC tablet 81 mg  81 mg Oral Daily Shela Leff, MD   81 mg at 07/01/16 1027  . atorvastatin (LIPITOR) tablet 80 mg  80 mg Oral q1800 Shela Leff, MD   80 mg at 06/30/16 1711  . carvedilol (COREG) tablet 25 mg  25 mg  Oral BID WC Shela Leff, MD   25 mg at 07/01/16 1027  . cephALEXin (KEFLEX) capsule 500 mg  500 mg Oral Q6H Asencion Partridge, MD   500 mg at 07/01/16 1026  . divalproex (DEPAKOTE) DR tablet 1,000 mg  1,000 mg Oral QHS Shela Leff, MD   1,000 mg at 06/30/16 2204  . enoxaparin (LOVENOX) injection 40 mg  40 mg Subcutaneous Q24H Rolla Flatten, RPH   40 mg at 07/01/16 1355  . famotidine (PEPCID) tablet 20 mg  20 mg Oral Daily Shela Leff, MD   20 mg at 07/01/16 1027  . FLUoxetine (PROZAC) capsule 20 mg  20 mg Oral Daily Sid Falcon, MD   20 mg at 0000000 [id number]  . folic acid (FOLVITE) tablet 1 mg  1 mg Oral Daily Asencion Partridge, MD   1 mg at 07/01/16 1027  . gabapentin (NEURONTIN) capsule 200 mg  200 mg Oral TID Shela Leff, MD   200 mg at 07/01/16 1026  . insulin aspart (novoLOG) injection 0-15 Units  0-15 Units Subcutaneous TID WC Asencion Partridge, MD   8 Units at 07/01/16 1355  . insulin aspart (novoLOG) injection 0-5 Units  0-5 Units Subcutaneous QHS Asencion Partridge, MD   2 Units at 06/28/16 2142  . [START ON 07/02/2016] insulin glargine (LANTUS) injection 18 Units  18 Units Subcutaneous Daily Asencion Partridge, MD      . lactated ringers infusion   Intravenous Continuous Roderic Palau, MD 10 mL/hr at 06/26/16 [phone]    . methocarbamol (ROBAXIN) tablet 500 mg  500 mg Oral Q6H PRN Newt Minion, MD   500 mg at 06/30/16 0447   Or  . methocarbamol (ROBAXIN) 500 mg in dextrose 5 % 50 mL IVPB  500 mg Intravenous Q6H PRN Newt Minion, MD      . metoCLOPramide (REGLAN) tablet 5-10 mg  5-10 mg Oral Q8H PRN Newt Minion, MD       Or  . metoCLOPramide (REGLAN) injection 5-10 mg  5-10 mg Intravenous Q8H PRN Newt Minion, MD      . metoCLOPramide (REGLAN) tablet 5 mg  5 mg Oral TID AC Shela Leff, MD   5 mg at 07/01/16 1355  . morphine 4 MG/ML injection 2 mg  2 mg Intravenous Q4H PRN Asencion Partridge, MD   2 mg at 07/01/16 1401  . mupirocin ointment (BACTROBAN) 2 % 1 application  1  application Nasal BID Sid Falcon, MD   1 application at 0000000 1354  . ondansetron (ZOFRAN) tablet 4 mg  4 mg Oral Q6H PRN Newt Minion, MD       Or  . ondansetron [facility]) injection 4 mg  4 mg Intravenous Q6H PRN Newt Minion, MD   4 mg at 06/29/16 1916  . oxyCODONE-acetaminophen (PERCOCET/ROXICET) 5-325 MG per tablet  1 tablet  1 tablet Oral Q8H PRN Asencion Partridge, MD   1 tablet at 07/01/16 1027  . pantoprazole (PROTONIX) EC tablet 40 mg  40 mg Oral Daily Shela Leff, MD   40 mg at 07/01/16 1027  . polyethylene glycol (MIRALAX / GLYCOLAX) packet 17 g  17 g Oral BID Asencion Partridge, MD   17 g at 07/01/16 1027  . senna (SENOKOT) tablet 8.6 mg  1 tablet Oral Once Asencion Partridge, MD      . senna-docusate (Senokot-S) tablet 2 tablet  2 tablet Oral QHS Asencion Partridge, MD   2 tablet at 06/27/16 2247  . traZODone (DESYREL) tablet 100 mg  100 mg Oral QHS Shela Leff, MD   100 mg at 06/30/16 2205  . vitamin B-12 (CYANOCOBALAMIN) tablet 1,000 mcg  1,000 mcg Oral Daily Asencion Partridge, MD   1,000 mcg at 07/01/16 1026     Discharge Medications: Please see discharge summary for a list of discharge medications.  Relevant Imaging Results:  Relevant Lab Results:   Additional Information SSN:  [id number]  Darden Dates, LCSW

## 2016-07-01 NOTE — Progress Notes (Signed)
Preliminary results by tech - Venous Duplex Lower Ext. Completed. Negative for deep and superficial vein thrombosis. A cystic structure was noted in the left medical, proximal calf area measuring 4.8 x 3.8 cm, which may represent a hematoma. Oda Cogan, BS, RDMS, RVT

## 2016-07-01 NOTE — Progress Notes (Signed)
Subjective: Remains short of breath, dyspneic with minimal exertion and at night. Concerned about left leg swelling. Reports had BM yesterday, first in many days.   Asked him if he'd be willing to receive Aranesp infusion, which is albumin-free at our hospital, and he stated that he could not receive this medication due religious reasons.   Objective: Vital signs in last 24 hours: Vitals:   06/30/16 1745 06/30/16 2113 07/01/16 0533 07/01/16 1105  BP: 132/78 127/69 132/71 135/77  Pulse: 84 80 96 89  Resp: 18 18 17 18   Temp: 98.3 F (36.8 C) 98.5 F (36.9 C) 99.7 F (37.6 C) 98.8 F (37.1 C)  TempSrc: Oral Oral Oral Oral  SpO2: 90% 92% 90% 97%  Weight:      Height:        Intake/Output Summary (Last 24 hours) at 07/01/16 1218 Last data filed at 07/01/16 1052  Gross per 24 hour  Intake             1280 ml  Output             1975 ml  Net             -695 ml   Physical Exam General appearance: Young-appearing gentleman resting in bed, feeling weak, conversational and polite Cardiovascular: Regular rate and rhythm, soft systolic murmur present, no rubs, gallops Respiratory/Chest: Clear to ausculation bilaterally, normal work of breathing Abdomen: Soft, non tender nondistended, normal bowel sounds Extremities: s/p left transmetatarsal amputation, wound vac in place over foot stump, resolved erythema of foot/ankle/calf, some LLE swelling remains Skin: Warm, dry, intact   Labs / Imaging / Procedures: CBC Latest Ref Rng & Units 07/01/2016 06/30/2016 06/28/2016  WBC 4.0 - 10.5 K/uL 11.3(H) 10.3 11.7(H)  Hemoglobin 13.0 - 17.0 g/dL 5.9(LL) 5.9(LL) 6.4(LL)  Hematocrit 39.0 - 52.0 % 18.2(L) 18.1(L) 19.6(L)  Platelets 150 - 400 K/uL 326 278 204   BMP Latest Ref Rng & Units 06/27/2016 06/26/2016 06/25/2016  Glucose 65 - 99 mg/dL 222(H) 234(H) 237(H)  BUN 6 - 20 mg/dL 26(H) 31(H) 27(H)  Creatinine 0.61 - 1.24 mg/dL 1.47(H) 1.60(H) 1.31(H)  Sodium 135 - 145 mmol/L 136 136 139  Potassium  3.5 - 5.1 mmol/L 4.9 5.0 4.5  Chloride 101 - 111 mmol/L 106 107 107  CO2 22 - 32 mmol/L 25 24 26   Calcium 8.9 - 10.3 mg/dL 8.1(L) 7.8(L) 8.4(L)   No results found. Assessment/Plan:  Shane [name] is a 46 y.o. man with PMH HTN, dCHF, DM2, HLD, anemia, osteomyelitis s/p amputation of left 3rd toe on 06/09/2016 admitted for cellulitis of his left lower leg and foot.  Severe LLE cellulitis and osteomyelitis of 2nd and 3rd metatarsal heads: MRI on 11/8 showed severe cellulitis, questionable myofasciitis, small abscesses adjacent to 2nd/3rd metatarsal heads and changes concerning for osteomyelitis. Underwent transmetatarsal amputation on 1/10 with Dr. Sharol Given. Patient is POD5 s/p left transmetatarsal amputation. Vanc/zosyn from 1/8 until 1/13, then transitioned to po Keflex. Blood cultures from 1/8 continue to show NGTD. From 1/11 is NGTD. MRSA nares positive. - LE dopplers negative for DVT, a cystic structure was noted in the left medical, proximal calf area measuring 4.8 x 3.8 cm, which may represent a hematoma. -Appreciate ortho recs - Po keflex q6 hours, end by today to complete 7 day course ABX for cellulitis -norco PRN -morphine PRN -zofran PRN -going to SNF.  New oxygen requirement: Multifactorial. Postop atelectasis and anemia.  Tonight patient 3L at 90-92%. Earlier a CT angio was  done which ruled out PE but showed some atelectasis. EKG had shown NSR. Troponins unremarkable. Could be due to anemia causing increased oxygen requirement.  Pt started using spirometer and has been OOB -continue to monitor oxygen requirement.  -maintain O2 sats above 92% -OOB and in the chair  -spirometer   T2DM with gastroparesis and peripheral neuropathy, last A1c 8.7 in 05/2016. Patient on home regimen of metformin 1000mg  BID, glipizide 5mg  daily and Lantus 10U qhs. CBGs 150s -Increase Lantus to 18 units daily  -SSI-M -reglan and gabapentin  Symptomatic anemia, 9.7-->6.5--> 5.9 > 5.9 . Baseline around 10.  Elevated ferritin in 01/2016. Likely anemia of chronic disease due to recurrent infections and dilutional component this hospital stay from IV fluids. Minimal blood loss in surgery. Ferritin elevated at 460. Given 1 dose of Feraheme. Pt is Jehovas Witness- no transfusions.  We will continue the prophylactic lovenox dose as pt is high risk of getting DVT.  -- Folate and B12 supplement daily -- S/p feraheme on 1/12 -- Next check CBC on 07/02/16 -- Will not accept blood or EPO, regardless of albumin-free  HTN, resolved with starting home medications. Patient on home regimen of amlodipine 10mg  daily, lisinopril 40mg  daily and coreg 12.5mg  BID.  --continue home amlodipine 10mg  daily and coreg 12.5mg  BID --hold lisinopril 40mg  daily in setting of AKI, can restart on d/c  Bipolar disorder, patient on home regimen of depakote and fluoxetine. --continue depakote 1000mg  daily and fluoxetine 20mg  daily.  DVT: Lovenox   Dispo: Anticipated discharge in approximately 2-3 days pending SNF  LOS: 7 days   Asencion Partridge, MD 07/01/2016, 12:18 PM

## 2016-07-02 ENCOUNTER — Telehealth (INDEPENDENT_AMBULATORY_CARE_PROVIDER_SITE_OTHER): Payer: Self-pay | Admitting: Orthopedic Surgery

## 2016-07-02 ENCOUNTER — Inpatient Hospital Stay (HOSPITAL_COMMUNITY): Payer: Medicaid Other

## 2016-07-02 ENCOUNTER — Ambulatory Visit (INDEPENDENT_AMBULATORY_CARE_PROVIDER_SITE_OTHER): Payer: Medicaid Other | Admitting: Orthopaedic Surgery

## 2016-07-02 ENCOUNTER — Encounter (INDEPENDENT_AMBULATORY_CARE_PROVIDER_SITE_OTHER): Payer: Self-pay

## 2016-07-02 DIAGNOSIS — R0602 Shortness of breath: Secondary | ICD-10-CM

## 2016-07-02 DIAGNOSIS — Z89432 Acquired absence of left foot: Secondary | ICD-10-CM

## 2016-07-02 DIAGNOSIS — K5900 Constipation, unspecified: Secondary | ICD-10-CM

## 2016-07-02 DIAGNOSIS — M869 Osteomyelitis, unspecified: Secondary | ICD-10-CM

## 2016-07-02 LAB — BASIC METABOLIC PANEL WITH GFR
Anion gap: 8 (ref 5–15)
BUN: 20 mg/dL (ref 6–20)
CO2: 25 mmol/L (ref 22–32)
Calcium: 8.6 mg/dL — ABNORMAL LOW (ref 8.9–10.3)
Chloride: 103 mmol/L (ref 101–111)
Creatinine, Ser: 1.24 mg/dL (ref 0.61–1.24)
GFR calc Af Amer: >60 mL/min
GFR calc non Af Amer: >60 mL/min
Glucose, Bld: 154 mg/dL — ABNORMAL HIGH (ref 65–99)
Potassium: 4.3 mmol/L (ref 3.5–5.1)
Sodium: 136 mmol/L (ref 135–145)

## 2016-07-02 LAB — GLUCOSE, CAPILLARY
GLUCOSE-CAPILLARY: 204 mg/dL — AB (ref 65–99)
Glucose-Capillary: 77 mg/dL (ref 65–99)
Glucose-Capillary: 184 mg/dL — ABNORMAL HIGH (ref 65–99)
Glucose-Capillary: 180 mg/dL — ABNORMAL HIGH (ref 65–99)
Glucose-Capillary: 201 mg/dL — ABNORMAL HIGH (ref 65–99)

## 2016-07-02 LAB — CULTURE, BLOOD (ROUTINE X 2)
Culture: NO GROWTH
Culture: NO GROWTH

## 2016-07-02 LAB — HIV ANTIBODY (ROUTINE TESTING W REFLEX): HIV Screen 4th Generation wRfx: NONREACTIVE

## 2016-07-02 MED ORDER — FUROSEMIDE 10 MG/ML IJ SOLN
40.0000 mg | Freq: Once | INTRAMUSCULAR | Status: AC
  Administered 2016-07-02: 40 mg via INTRAVENOUS
  Filled 2016-07-02: qty 4

## 2016-07-02 MED ORDER — POLYETHYLENE GLYCOL 3350 17 G PO PACK: 17.0000 g | PACK | Freq: Every day | ORAL | Status: DC | PRN

## 2016-07-02 MED ORDER — SENNOSIDES-DOCUSATE SODIUM 8.6-50 MG PO TABS
1.0000 | ORAL_TABLET | Freq: Every day | ORAL | Status: DC
  Administered 2016-07-02: 1 via ORAL
  Filled 2016-07-02: qty 1

## 2016-07-02 NOTE — Progress Notes (Deleted)
Discussed wound care with staff from Dr. [facility] clinic. They state that Prevena wound vac will alarm or run out of battery about 1 week post-op and means it is safe for removal. This should occur for [name] sometime tomorrow. After wound vac removal his foot stump can be managed with daily dry dressing changes.

## 2016-07-02 NOTE — Progress Notes (Signed)
   07/02/16 1000  Clinical Encounter Type  Visited With Patient  Visit Type Initial  Referral From Nurse    Nurse referred, offered prayer, pt declined.

## 2016-07-02 NOTE — Telephone Encounter (Signed)
This pt is s/p a transmet 06/27/16

## 2016-07-02 NOTE — Telephone Encounter (Signed)
Called and s/w adam johnson - to d/c vac if alarms or batteries run out, begin daily dry dressing changes daily

## 2016-07-02 NOTE — Clinical Social Work Note (Signed)
Pt seen for New SNF. Pt will discharge to [facility] when medically stable. Assessment to follow. CSW will continue to follow.  Darden Dates, MSW, LCSW  Clinical Social Worker  [phone]

## 2016-07-02 NOTE — Progress Notes (Signed)
Physical Therapy Treatment Patient Details Name: Shane [name] MRN: [id number] DOB: 24-Feb-1971 Today's Date: 07/02/2016    History of Present Illness Shane [name] is a 46 y.o. male admitted on 06/23/2016 with cellulitis.  Pharmacy has been consulted for vancomycin and zosyn dosing.  S/p trans-met amputation. PMH DMII, HTN, bipolar, dyspnea, and chest pain     PT Comments    Pt without c/o dizziness/lightheadedness throughout session. Pt with initial good ability to maintain TWB status on L but required frequent cues to lift and offload LLE as pt fatigued. Pt desats to 88% on 4L O2 with ambulation. Increased O2 to 6L and O2 improved to 91%. Verbally reviewed and demonstrated HEP with pt. Pt verbalized understanding. Pt reports he has not yet been able to perform x1 exercises for 1 minute but is continuing to progress himself as tolerated. Pt continues to be an appropriate candidate for SNF to allow for increased healing time before return to home. PT will continue to follow acutely.    Follow Up Recommendations  SNF;Supervision for mobility/OOB     Equipment Recommendations  Wheelchair (measurements PT);Wheelchair cushion (measurements PT) (18x18, w/c with cushion)    Recommendations for Other Services       Precautions / Restrictions Precautions Precautions: Fall Precaution Comments: O2 dependent Restrictions Weight Bearing Restrictions: Yes LLE Weight Bearing: Touchdown weight bearing    Mobility  Bed Mobility Overal bed mobility: Needs Assistance Bed Mobility: Supine to Sit     Supine to sit: Supervision     General bed mobility comments: for safety, line management  Transfers Overall transfer level: Needs assistance Equipment used: Rolling walker (2 wheeled)   Sit to Stand: Min guard         General transfer comment: Min guard for safety. Good ability to maintain NWB L with sit to stand. No c/o dizziness or pain  Ambulation/Gait Ambulation/Gait assistance: Min  assist Ambulation Distance (Feet): 50 Feet Assistive device: Rolling walker (2 wheeled) Gait Pattern/deviations: Step-to pattern;Decreased stride length Gait velocity: decreased Gait velocity interpretation: Below normal speed for age/gender General Gait Details: Poor ability to keep TWB status with increased fatigue. Pt insistant on continuing ambulation. Min A for maintainign TWB status. No c/o dizziness.    Stairs            Wheelchair Mobility    Modified Rankin (Stroke Patients Only)       Balance Overall balance assessment: Needs assistance Sitting-balance support: No upper extremity supported;Feet supported Sitting balance-Leahy Scale: Good     Standing balance support: Bilateral upper extremity supported Standing balance-Leahy Scale: Poor Standing balance comment: UE support for balance keeping L LE NWB or TWB                    Cognition Arousal/Alertness: Awake/alert Behavior During Therapy: WFL for tasks assessed/performed Overall Cognitive Status: Within Functional Limits for tasks assessed                      Exercises General Exercises - Lower Extremity Ankle Circles/Pumps: AROM;Both;10 reps Long Arc Quad: AROM;Both;10 reps;Seated Hip Flexion/Marching: AROM;Both;10 reps;Seated    General Comments        Pertinent Vitals/Pain Pain Assessment: 0-10 Pain Score: 9  Pain Location: L foot Pain Descriptors / Indicators: Discomfort;Tightness Pain Intervention(s): Limited activity within patient's tolerance;Monitored during session;Repositioned    Home Living                      Prior Function  PT Goals (current goals can now be found in the care plan section) Acute Rehab PT Goals Patient Stated Goal: to walk PT Goal Formulation: With patient Time For Goal Achievement: 07/11/16 Potential to Achieve Goals: Good Progress towards PT goals: Progressing toward goals    Frequency    Min 3X/week      PT  Plan Current plan remains appropriate    Co-evaluation             End of Session Equipment Utilized During Treatment: Gait belt;Oxygen Activity Tolerance: Patient limited by fatigue;Patient limited by pain Patient left: in chair;with call bell/phone within reach     Time: 1152-1221 PT Time Calculation (min) (ACUTE ONLY): 29 min  Charges:  $Gait Training: 8-22 mins $Therapeutic Exercise: 8-22 mins                    G Codes:      Tonia Brooms 2016/07/30, 1:05 PM Tonia Brooms, SPT [phone]

## 2016-07-02 NOTE — Progress Notes (Signed)
Subjective: Feels the same today, fatigued and dizzy, continues to experience dyspnea with exertion with chest tightness and both resolve with rest. No abdominal pain, had BM this AM. Reports no dyspnea at rest on or off oxygen.   Objective: Vital signs in last 24 hours: Vitals:   07/02/16 0418 07/02/16 0909 07/02/16 0910 07/02/16 1009  BP: 100/66   132/75  Pulse: 100     Resp: 19     Temp: 97.9 F (36.6 C)     TempSrc: Oral     SpO2: (!) 80% (!) 88% 96%   Weight:      Height:        Intake/Output Summary (Last 24 hours) at 07/02/16 1259 Last data filed at 07/02/16 1038  Gross per 24 hour  Intake              960 ml  Output              500 ml  Net              460 ml   Physical Exam General appearance: Young-appearing gentleman resting in bed, feeling weak, conversational and polite HENT: Atraumatic, moist mucous membranes, conjunctival pallor present Cardiovascular: Regular rate and rhythm, soft systolic murmur present, no rubs, gallops Respiratory/Chest: Mild bibasilar crackles, normal work of breathing Abdomen: Soft, non tender, mildly distended, normal bowel sounds Extremities: s/p left transmetatarsal amputation, wound vac in place over foot stump, resolved erythema of foot/ankle/calf, 1+ pitting edema of LLE remains, mild bruising present over posterior left calf and thigh Skin: Warm, dry, intact   Labs / Imaging / Procedures: CBC Latest Ref Rng & Units 07/01/2016 06/30/2016 06/28/2016  WBC 4.0 - 10.5 K/uL 11.3(H) 10.3 11.7(H)  Hemoglobin 13.0 - 17.0 g/dL 5.9(LL) 5.9(LL) 6.4(LL)  Hematocrit 39.0 - 52.0 % 18.2(L) 18.1(L) 19.6(L)  Platelets 150 - 400 K/uL 326 278 204   BMP Latest Ref Rng & Units 06/27/2016 06/26/2016 06/25/2016  Glucose 65 - 99 mg/dL 222(H) 234(H) 237(H)  BUN 6 - 20 mg/dL 26(H) 31(H) 27(H)  Creatinine 0.61 - 1.24 mg/dL 1.47(H) 1.60(H) 1.31(H)  Sodium 135 - 145 mmol/L 136 136 139  Potassium 3.5 - 5.1 mmol/L 4.9 5.0 4.5  Chloride 101 - 111 mmol/L 106  107 107  CO2 22 - 32 mmol/L 25 24 26   Calcium 8.9 - 10.3 mg/dL 8.1(L) 7.8(L) 8.4(L)   No results found. Assessment/Plan:  Shane [name] is a 46 y.o. man with PMH HTN, dCHF, DM2, HLD, anemia, osteomyelitis s/p amputation of left 3rd toe on 06/09/2016 admitted for cellulitis of his left lower leg and foot.  Severe LLE cellulitis and osteomyelitis of 2nd and 3rd metatarsal heads: MRI on 11/8 showed severe cellulitis, questionable myofasciitis, small abscesses adjacent to 2nd/3rd metatarsal heads and changes concerning for osteomyelitis. Underwent transmetatarsal amputation on 1/10 with Dr. Sharol Given. Patient is POD5 s/p left transmetatarsal amputation. Vanc/zosyn from 1/8 until 1/13, then transitioned to po Keflex. Blood cultures from 1/8 continue to show NGTD. From 1/11 is NGTD. MRSA nares positive. - LE dopplers negative for DVT x2, but evidence of hematoma - Appreciate ortho recs, will follow up with Dr. Sharol Given - Completed keflex on 1/15 -norco PRN - dc IV morphine PRN -zofran PRN -going to SNF, [facility] today  Persistent oxygen requirement, Multifactorial. Postop atelectasis and anemia. Requiring 3L at 90-92%. Earlier a CT angio was done which ruled out PE but showed some atelectasis. EKG had shown NSR. Troponins unremarkable. Could be due to anemia causing  increased oxygen requirement.  Pt using spirometer and has been OOB. -Some bibasilar crackles, CXR 2v today -continue to monitor oxygen requirement.  -maintain O2 sats above 92% -OOB and in the chair  -spirometer   T2DM with gastroparesis and peripheral neuropathy, last A1c 8.7 in 05/2016. Patient on home regimen of metformin 1000mg  BID, glipizide 5mg  daily and Lantus 10U qhs. CBGs 150s - Lantus to 18 units daily  -SSI-M -reglan and gabapentin  Symptomatic anemia, 9.7-->6.5--> 5.9 > 5.9 . Baseline around 10. Elevated ferritin in 01/2016. Likely anemia of chronic disease due to recurrent infections and dilutional component this hospital  stay from IV fluids. Minimal blood loss in surgery. Ferritin elevated at 460. Given 1 dose of Feraheme. Pt is Jehovas Witness- no transfusions.  We will continue the prophylactic lovenox dose as pt is high risk of getting DVT. Retics barely elevated 3.1 upper limit of normal.   -- Folate and B12 supplement daily -- S/p feraheme on 1/12 -- Will not accept blood or EPO, regardless of albumin-free  HTN, resolved with starting home medications. Patient on home regimen of amlodipine 10mg  daily, lisinopril 40mg  daily and coreg 12.5mg  BID.  --continue home amlodipine 10mg  daily and coreg 12.5mg  BID --hold lisinopril 40mg  daily in setting of AKI, normotensive, can restart outpatient  Bipolar disorder, patient on home regimen of depakote and fluoxetine. --continue depakote 1000mg  daily and fluoxetine 20mg  daily.  DVT: Lovenox   Dispo: Anticipated discharge to SNF in approximately 0-1 days.  LOS: 8 days   Asencion Partridge, MD 07/02/2016, 12:59 PM

## 2016-07-02 NOTE — Discharge Summary (Signed)
Name: Shane [name] MRN: [id number] DOB: 01-12-1971 46 y.o. PCP: Boykin Nearing, MD  Date of Admission: 06/23/2016  9:40 PM Date of Discharge: 07/03/2016 Attending Physician: Sid Falcon, MD  Discharge Diagnosis: 1. Left metatarsal osteomyelitis 2. Cellulitis of left foot and leg 3. S/p transmetatarsal amputation of left foot 4. Symptomatic anemia 5. Anemia of chronic disease 6. Chronic diastolic CHF 7. Constipation 8. AKI 9. Type 2 diabetes mellitus  Principal Problem:   S/P transmetatarsal amputation of foot, left (HCC) Active Problems:   Symptomatic anemia   AKI (acute kidney injury) ([place])   Diabetes mellitus type 2, uncontrolled, with complications (HCC)   Chronic diastolic CHF (congestive heart failure), NYHA class 1 (HCC)   Dizziness   Cellulitis of left lower leg   Constipation   Osteomyelitis of left foot (HCC)   Dyspnea on exertion   Discharge Medications: Allergies as of 07/03/2016      Reactions   Lactose Intolerance (gi) Diarrhea, Other (See Comments)   bloating   Other Other (See Comments)   Red meat causes stomach pains, bloating and diarrhea   Milk-related Compounds Diarrhea, Other (See Comments)   Any dairy products  - diarrhea and bloating      Medication List    STOP taking these medications   lisinopril 40 MG tablet Commonly known as:  PRINIVIL,ZESTRIL     TAKE these medications   amLODipine 10 MG tablet Commonly known as:  NORVASC Take 1 tablet (10 mg total) by mouth daily.   aspirin EC 81 MG tablet Take 1 tablet (81 mg total) by mouth daily.   atorvastatin 80 MG tablet Commonly known as:  LIPITOR Take 1 tablet (80 mg total) by mouth every morning. What changed:  when to take this   carvedilol 25 MG tablet Commonly known as:  COREG Take 1 tablet (25 mg total) by mouth 2 (two) times daily with a meal.   cyanocobalamin 1000 MCG tablet Take 1 tablet (1,000 mcg total) by mouth daily. Start taking on:  07/04/2016   divalproex  500 MG DR tablet Commonly known as:  DEPAKOTE Take 2 tablets (1,000 mg total) by mouth at bedtime.   FLUoxetine 20 MG tablet Commonly known as:  PROZAC Take 1 tablet (20 mg total) by mouth every morning.   folic acid 1 MG tablet Commonly known as:  FOLVITE Take 1 tablet (1 mg total) by mouth daily. Start taking on:  07/04/2016   furosemide 40 MG tablet Commonly known as:  LASIX Take 1 tablet (40 mg total) by mouth daily as needed for fluid or edema (or weight gain 3 lbs in 1 day or 5 lbs in 1 week).   gabapentin 100 MG capsule Commonly known as:  NEURONTIN Take 2 capsules (200 mg total) by mouth 3 (three) times daily.   ibuprofen 800 MG tablet Commonly known as:  ADVIL,MOTRIN TAKE 1 TABLET BY MOUTH EVERY 8 HOURS AS NEEDED. What changed:  See the new instructions.   Insulin Glargine 100 UNIT/ML Solostar Pen Commonly known as:  LANTUS SOLOSTAR Inject 18 Units into the skin every morning. What changed:  how much to take   metFORMIN 1000 MG tablet Commonly known as:  GLUCOPHAGE Take 1 tablet (1,000 mg total) by mouth 2 (two) times daily with a meal.   metoCLOPramide 5 MG tablet Commonly known as:  REGLAN Take 1 tablet (5 mg total) by mouth 3 (three) times daily before meals.   ondansetron 8 MG disintegrating tablet Commonly known as:  ZOFRAN ODT Take 1 tablet (8 mg total) by mouth every 8 (eight) hours as needed for nausea or vomiting.   oxyCODONE-acetaminophen 5-325 MG tablet Commonly known as:  PERCOCET/ROXICET Take 1-2 tablets by mouth every 8 (eight) hours as needed for moderate pain or severe pain.   pantoprazole 40 MG tablet Commonly known as:  PROTONIX Take 40 mg by mouth daily.   polyethylene glycol packet Commonly known as:  MIRALAX / GLYCOLAX Take 17 g by mouth daily as needed for moderate constipation.   ranitidine 300 MG tablet Commonly known as:  ZANTAC Take 1 tablet (300 mg total) by mouth at bedtime.   senna-docusate 8.6-50 MG tablet Commonly  known as:  Senokot-S Take 1 tablet by mouth at bedtime.   traZODone 100 MG tablet Commonly known as:  DESYREL Take 1 tablet (100 mg total) by mouth at bedtime.   triamcinolone ointment 0.5 % Commonly known as:  KENALOG Apply 1 application topically 2 (two) times daily. What changed:  when to take this  reasons to take this   Vitamin D (Ergocalciferol) 50000 units Caps capsule Commonly known as:  DRISDOL Take 1 capsule (50,000 Units total) by mouth every 30 (thirty) days. Every 30 days What changed:  additional instructions       Disposition and follow-up:   Shane [name] was discharged from [facility] in Stable condition.  At the hospital follow up visit please address:  1.  Left metatarsal osteomyelitis and foot cellulitis s/p transmetatarsal amputation on 1/10 /18 - completed course of antibiotics, assess for resolution of cellulitis symptoms in left leg, assess for healing after recent surgical amputation, assess ambulatory ability and exercise tolerance. Wean off pain control. Will need to follow up with [place] / Dr. Sharol Given.   Symptomatic anemia - assess for improvement, signs and symptoms of anemia, dyspnea, dizziness, fatigue. Hemoglobin stable at 5.9 for several days. Patient is Jehovah's Witness and will not accept transfusion or Aranesp / erythropoietin injection (even albumin-free). Minimizing blood draws / iatrogenic blood loss.   Anemia of chronic disease - persistently low hemoglobin, normocytic, iron studies consistent with chronic disease, likely from recurrent osteomyelitis infections throughout this year, now some dilutional component from IV fluids during hospital stay. Received one dose of IV feraheme and started on daily folate and B12 supplements. Assess iron studies  Chronic diastolic CHF - developed weight gain and mild pulmonary edema during hospital stay - assess weight, extremity edema, respiratory and fluid status, dry weight  approx 192 lbs  Constipation - likely secondary to poor mobility and pain medications, assess frequency of BMs and need for motility agents  AKI - presented with increase in creatinine from baseline 1.0, improved with fluids, assess renal function and urine output  Type 2 diabetes mellitus - last A1c 8.7 in 05/2016, hyperglycemic during early hospital stay to 2-300s, required increase in long-acting insulin doses and moderate sliding scale while inpatient,  assess glycemic control   2.  Labs / imaging needed at time of follow-up: CBC, BMP, CBG, iron panel  3.  Pending labs/ test needing follow-up: None  Follow-up Appointments:  Contact information for follow-up providers    Newt Minion, MD. Go on 07/08/2016.   Specialty:  Orthopedic Surgery Why:  At 2:15 PM , please arrive 15 minutes early Contact information: [place] [address] [phone]            Contact information for after-discharge care    Destination  [facility] Follow up.   Specialty:  [place] information: [address] [place] [facility] Course by problem list: Principal Problem:   S/P transmetatarsal amputation of foot, left (HCC) Active Problems:   Symptomatic anemia   AKI (acute kidney injury) ([place])   Diabetes mellitus type 2, uncontrolled, with complications (HCC)   Chronic diastolic CHF (congestive heart failure), NYHA class 1 (HCC)   Dizziness   Cellulitis of left lower leg   Constipation   Osteomyelitis of left foot (HCC)   Dyspnea on exertion   1. Left metatarsal osteomyelitis and foot cellulitis Shane [name] is a 46 y.o. man with PMH HTN, dCHF, DM2, HLD, anemia, osteomyelitis s/p amputation of left 3rd toe on 06/09/2016 who presented on 1/8 with 1 week of progressive left leg erythema, swelling, and pain spreading from the recent surgical site. He was admitted for  cellulitis and started on broad spectrum antibiotics (Vancomycin, Zosyn). MRI on 11/8 showed severe cellulitis, questionable myofasciitis, small abscesses adjacent to 2nd/3rd metatarsal heads and changes concerning for osteomyelitis. [name] orthopedic surgeons consulted and planned patient for surgery.  He underwent transmetatarsal amputation on 1/10 via Dr. Sharol Given. Antibiotics de-escalated on 1/13 to oral Keflex and took this complete his 7 day course of antibiotics for cellulitis on 1/15. Blood cultures from 1/8 and 1/11 with no growth to date, no fever since 1/10. Wound vac removed on 1/17 and started on dry dressing changes daily. Scheduled to follow up with Dr. Sharol Given.  Symptomatic anemia, anemia of chronic disease - presented with Hemoglobin at 9.7, and gradually dropped to 5.9 and stabilized. Normocytic, iron studies reflect anemia of chronic disease likely due to  recurrent osteomyelitis infections, and now dilutional component from IV fluids and excess fluid during hospital stay. No source of bleeding. Received one dose of IV feraheme and started on daily folate and B12 supplements. 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 His hemoglobin remains stable and will gradually recover. Continues to experience dizziness, dyspnea on exertion, and intermittent transient chest tightness.   Chronic diastolic CHF - developed weight gain and mild pulmonary edema during hospital stay, dry weight approx 192 llbs and weight climbed to 220 lbs. Received IV Lasix on 1/16 and 1/17, discharged with prn Lasix po.  Constipation - intermittent difficulties during hospital stay, due to immobility and narcotic pain medications, unable to have daily BMs without Senna and Miralax, Rx provided  AKI - improved with fluids and good po intake to near baseline during hospital stay  Type 2 diabetes mellitus - last A1c 8.7 in 05/2016, hyperglycemic during early hospital stay to 2-300s,  required increase in long-acting insulin doses and moderate sliding scale while inpatient, glucose control improved throughout hospital stay.  Discharge Vitals:   BP 122/64 (BP Location: Right Arm)   Pulse 72   Temp 98.2 F (36.8 C) (Oral)   Resp 18   Ht 5\' 7"  (1.702 m)   Wt 213 lb 8 oz (96.8 kg)   SpO2 96%   BMI 33.44 kg/m    Filed Weights   06/28/16 0404 06/28/16 2236 07/02/16 1650  Weight: 212 lb 8.4 oz (96.4 kg) 220 lb 10.9 oz (100.1 kg) 213 lb 8 oz (96.8 kg)   Pertinent Labs, Studies, and Procedures:  CBC Latest Ref Rng & Units 07/03/2016 07/01/2016 06/30/2016  WBC 4.0 - 10.5 K/uL  9.8 11.3(H) 10.3  Hemoglobin 13.0 - 17.0 g/dL 5.9(LL) 5.9(LL) 5.9(LL)  Hematocrit 39.0 - 52.0 % 18.8(L) 18.2(L) 18.1(L)  Platelets 150 - 400 K/uL 397 326 278   BMP Latest Ref Rng & Units 07/02/2016 06/27/2016 06/26/2016  Glucose 65 - 99 mg/dL 154(H) 222(H) 234(H)  BUN 6 - 20 mg/dL 20 26(H) 31(H)  Creatinine 0.61 - 1.24 mg/dL 1.24 1.47(H) 1.60(H)  Sodium 135 - 145 mmol/L 136 136 136  Potassium 3.5 - 5.1 mmol/L 4.3 4.9 5.0  Chloride 101 - 111 mmol/L 103 106 107  CO2 22 - 32 mmol/L 25 25 24   Calcium 8.9 - 10.3 mg/dL 8.6(L) 8.1(L) 7.8(L)    Recent Labs Lab 07/02/16 0736 07/02/16 1153 07/02/16 1718 07/02/16 2134 07/03/16 0728  GLUCAP 184* 204* 180* 201* 166*   Results for JALENE, DIBLASI (MRN [id number]) as of 07/02/2016 15:01  Ref. Range 07/01/2016 10:58  Retic Ct Pct Latest Ref Range: 0.4 - 3.1 % 3.1   Results for TAZ, WAGGLE (MRN [id number]) as of 07/02/2016 15:01  Ref. Range 06/28/2016 11:41  Iron Latest Ref Range: 45 - 182 ug/dL 11 (L)  UIBC Latest Units: ug/dL 210  TIBC Latest Ref Range: 250 - 450 ug/dL 221 (L)  Saturation Ratios Latest Ref Range: 17.9 - 39.5 % 5 (L)  Ferritin Latest Ref Range: 24 - 336 ng/mL 436 (H)   Results for KENYATA, SITTLER (MRN [id number]) as of 07/02/2016 15:01  Ref. Range 07/01/2016 11:28  HIV Latest Ref Range: Non Reactive  Non Reactive   Mr Foot Left, ANKLE,  TIB/FIB W Wo Contrast  Result Date: 06/24/2016 CLINICAL DATA:  History of prior amputations. The most recent was the third toe. Pain, swelling and erythema. EXAM: MRI OF LOWER LEFT EXTREMITY WITHOUT AND WITH CONTRAST; MRI OF THE LEFT ANKLE WITHOUT AND WITH CONTRAST; MRI OF THE LEFT FOREFOOT WITHOUT AND WITH CONTRAST TECHNIQUE: Multiplanar, multisequence MR imaging of the left lower extremity was performed both before and after administration of intravenous contrast. CONTRAST:  20 cc MultiHance COMPARISON:  Radiographs 06/23/2016 FINDINGS: Extensive subcutaneous soft tissue swelling/ edema/ fluid extending from the left knee all way down to the forefoot. No significant findings for myositis involving the calf. There is a 4 cm hematoma noted along the medial aspect of the calf adjacent to the medial gastrocs and soleus muscles. No findings to suggest septic arthritis at the knee or ankle joints. No findings for osteomyelitis involving the tibia or fibula. There is a 13 x 6 mm rim enhancing fluid collection in the region of the recent third toe amputation most consistent with a postoperative abscess. There is also a small fluid collection along the second metatarsal head with some enhancement. Could not exclude a small/ developing abscess. Signal abnormality and enhancement in the second and third metatarsal heads is suspicious for osteomyelitis. Fairly extensive cellulitis and myofasciitis involving the forefoot and midfoot regions. The mid and hindfoot bony structures are intact. Incidental note is made of a chronically torn flexor hallucis longus tendon. IMPRESSION: Severe diffuse cellulitis involving the entire imaged left lower extremity. Two small abscesses distal to the second and third metatarsal heads. Suspect osteomyelitis involving the second and third metatarsal heads. Cellulitis and diffuse myofasciitis involving the forefoot and midfoot regions. Electronically Signed   By: Marijo Sanes M.D.   On:  06/24/2016 15:29   Dg Chest 2 View  Result Date: 07/02/2016 CLINICAL DATA:  Shortness of breath, bibasilar crackle, history of foot amputation 1 week ago EXAM: CHEST  2 VIEW  COMPARISON:  Chest x-ray of 06/27/2016 FINDINGS: There is mild linear atelectasis at the left lung base. Also there do appear to be small pleural effusions present. There is a small amount of fluid in the fissures on the lateral view and very mild congestive heart failure cannot be excluded. The heart is mildly enlarged. No bony abnormality is seen. IMPRESSION: 1. Small bilateral pleural effusions and left basilar linear atelectasis. 2. Cardiomegaly.  Cannot exclude mild pulmonary vascular congestion. Electronically Signed   By: Ivar Drape M.D.   On: 07/02/2016 15:00    Discharge Instructions: Discharge Instructions    Diet - low sodium heart healthy    Complete by:  As directed    Discharge instructions    Complete by:  As directed    Please continue to take your medications as prescribed and do your best to work with physical therapy in the future. This will improve your recovery time.   During your hospital stay you developed excess fluid weight. We have prescribed a new medication, Lasix 40 mg tablets to take once daily as needed for any worsening swelling in your arms/legs, and especially for weight gain of 3 pounds in 1 day, or 5 pounds over 1 week. It is important to check your weight daily to monitor for this.  For your persistent anemia we have prescribed folate and vitamin B12 supplements to help your body produce more red blood cells.  For pain from your surgical wound we have prescribe a short course of Oxycodone-Acetaminophen (Percocet) to take 1-2 tablets up to three times daily as needed for pain. This is a temporary medication and you should NOT take it longer than 2-3 weeks after your surgery, but instead transition to using Ibuprofen or Tylenol alone.   We have prescribed Senokot-S tablets and MiraLax  solution to help treat constipation as well.  Please attend all follow up appointments with Dr. Sharol Given and your PCP.   Increase activity slowly    Complete by:  As directed    Touch down weight bearing    Complete by:  As directed    Laterality:  left   Extremity:  Lower      Signed: Asencion Partridge, MD 07/03/2016, 12:22 PM   Pager: [phone]

## 2016-07-03 DIAGNOSIS — D638 Anemia in other chronic diseases classified elsewhere: Secondary | ICD-10-CM

## 2016-07-03 DIAGNOSIS — K5900 Constipation, unspecified: Secondary | ICD-10-CM

## 2016-07-03 DIAGNOSIS — M868X7 Other osteomyelitis, ankle and foot: Secondary | ICD-10-CM

## 2016-07-03 LAB — CBC
HEMATOCRIT: 18.8 % — AB (ref 39.0–52.0)
HEMOGLOBIN: 5.9 g/dL — AB (ref 13.0–17.0)
MCH: 30.1 pg (ref 26.0–34.0)
MCHC: 31.4 g/dL (ref 30.0–36.0)
MCV: 95.9 fL (ref 78.0–100.0)
Platelets: 397 10*3/uL (ref 150–400)
RBC: 1.96 MIL/uL — ABNORMAL LOW (ref 4.22–5.81)
RDW: 16.1 % — ABNORMAL HIGH (ref 11.5–15.5)
WBC: 9.8 10*3/uL (ref 4.0–10.5)

## 2016-07-03 LAB — GLUCOSE, CAPILLARY
GLUCOSE-CAPILLARY: 286 mg/dL — AB (ref 65–99)
Glucose-Capillary: 166 mg/dL — ABNORMAL HIGH (ref 65–99)

## 2016-07-03 MED ORDER — FUROSEMIDE 10 MG/ML IJ SOLN
40.0000 mg | Freq: Once | INTRAMUSCULAR | Status: AC
  Administered 2016-07-03: 40 mg via INTRAVENOUS
  Filled 2016-07-03: qty 4

## 2016-07-03 MED ORDER — POLYETHYLENE GLYCOL 3350 17 G PO PACK: 17.0000 g | PACK | Freq: Every day | ORAL | 0 refills | Status: AC | PRN

## 2016-07-03 MED ORDER — INSULIN GLARGINE 100 UNIT/ML SOLOSTAR PEN: 18.0000 [IU] | PEN_INJECTOR | SUBCUTANEOUS | 11 refills | Status: DC

## 2016-07-03 MED ORDER — OXYCODONE-ACETAMINOPHEN 5-325 MG PO TABS: 1.0000 | ORAL_TABLET | Freq: Three times a day (TID) | ORAL | 0 refills | Status: DC | PRN

## 2016-07-03 MED ORDER — FOLIC ACID 1 MG PO TABS: 1.0000 mg | ORAL_TABLET | Freq: Every day | ORAL | 3 refills | Status: DC

## 2016-07-03 MED ORDER — CYANOCOBALAMIN 1000 MCG PO TABS: 1000.0000 ug | ORAL_TABLET | Freq: Every day | ORAL | 3 refills | Status: DC

## 2016-07-03 MED ORDER — SENNOSIDES-DOCUSATE SODIUM 8.6-50 MG PO TABS: 1.0000 | ORAL_TABLET | Freq: Every day | ORAL | 2 refills | Status: AC

## 2016-07-03 MED ORDER — FUROSEMIDE 40 MG PO TABS: 40.0000 mg | ORAL_TABLET | Freq: Every day | ORAL | 1 refills | Status: DC | PRN

## 2016-07-03 NOTE — Clinical Social Work Note (Signed)
Pt is ready for discharge today and will go to [facility]. Pt is aware and agreeable to discharge plan. Facility is ready to admit pt. RN to call report. [facility] will provide transportation. CSW is signing off as no further needs identified.

## 2016-07-03 NOTE — Progress Notes (Signed)
Subjective: Mr. Ballard is sleeping comfortably this morning. Awakens and conversational, asking about the snowy weather. CXR showed some pulm vascular congestion yesterday and received Lasix, he reports copious urine output yesterday, 2 x BM, and pain is controlled. He continues to have bibasilar crackles on exam today with oxygen requirement.   Objective: Vital signs in last 24 hours: Vitals:   07/02/16 1315 07/02/16 1650 07/02/16 2042 07/03/16 0442  BP: 120/80  125/77 122/64  Pulse: 82  83 72  Resp: 17  19 18   Temp: 98.3 F (36.8 C)  98.2 F (36.8 C) 98.2 F (36.8 C)  TempSrc: Oral  Oral Oral  SpO2: 95%  96% 96%  Weight:  213 lb 8 oz (96.8 kg)    Height:        Intake/Output Summary (Last 24 hours) at 07/03/16 0758 Last data filed at 07/02/16 1504  Gross per 24 hour  Intake              480 ml  Output              600 ml  Net             -120 ml   Filed Weights   06/28/16 0404 06/28/16 2236 07/02/16 1650  Weight: 212 lb 8.4 oz (96.4 kg) 220 lb 10.9 oz (100.1 kg) 213 lb 8 oz (96.8 kg)   Physical Exam General appearance: Young-appearing gentleman resting in bed, feeling weak, conversational and polite HENT: Atraumatic, moist mucous membranes, conjunctival pallor present Cardiovascular: Regular rate and rhythm, soft systolic murmur present, no rubs, gallops Respiratory/Chest: Mild bibasilar crackles, normal work of breathing Abdomen: Soft, non tender, moderately distended, normal bowel sounds Extremities: s/p left transmetatarsal amputation, wound vac in place over foot stump, resolved erythema of foot/ankle/calf, trace pitting edema of LLE remains, mild bruising present over posterior left calf and thigh Skin: Warm, dry, intact   Labs / Imaging / Procedures: CBC Latest Ref Rng & Units 07/03/2016 07/01/2016 06/30/2016  WBC 4.0 - 10.5 K/uL 9.8 11.3(H) 10.3  Hemoglobin 13.0 - 17.0 g/dL 5.9(LL) 5.9(LL) 5.9(LL)  Hematocrit 39.0 - 52.0 % 18.8(L) 18.2(L) 18.1(L)  Platelets 150  - 400 K/uL 397 326 278   BMP Latest Ref Rng & Units 07/02/2016 06/27/2016 06/26/2016  Glucose 65 - 99 mg/dL 154(H) 222(H) 234(H)  BUN 6 - 20 mg/dL 20 26(H) 31(H)  Creatinine 0.61 - 1.24 mg/dL 1.24 1.47(H) 1.60(H)  Sodium 135 - 145 mmol/L 136 136 136  Potassium 3.5 - 5.1 mmol/L 4.3 4.9 5.0  Chloride 101 - 111 mmol/L 103 106 107  CO2 22 - 32 mmol/L 25 25 24   Calcium 8.9 - 10.3 mg/dL 8.6(L) 8.1(L) 7.8(L)   Dg Chest 2 View  Result Date: 07/02/2016 CLINICAL DATA:  Shortness of breath, bibasilar crackle, history of foot amputation 1 week ago EXAM: CHEST  2 VIEW COMPARISON:  Chest x-ray of 06/27/2016 FINDINGS: There is mild linear atelectasis at the left lung base. Also there do appear to be small pleural effusions present. There is a small amount of fluid in the fissures on the lateral view and very mild congestive heart failure cannot be excluded. The heart is mildly enlarged. No bony abnormality is seen. IMPRESSION: 1. Small bilateral pleural effusions and left basilar linear atelectasis. 2. Cardiomegaly.  Cannot exclude mild pulmonary vascular congestion. Electronically Signed   By: Ivar Drape M.D.   On: 07/02/2016 15:00   Assessment/Plan:  York Harcum is a 46 y.o. man with PMH HTN,  dCHF, DM2, HLD, anemia, osteomyelitis s/p amputation of left 3rd toe on 06/09/2016 admitted for cellulitis of his left lower leg and foot.  Severe LLE cellulitis and osteomyelitis of 2nd and 3rd metatarsal heads: MRI on 11/8 showed severe cellulitis, questionable myofasciitis, small abscesses adjacent to 2nd/3rd metatarsal heads and changes concerning for osteomyelitis. Underwent transmetatarsal amputation on 1/10 with Dr. Sharol Given. Patient is POD 7 s/p left transmetatarsal amputation. Vanc/zosyn from 1/8 until 1/13, then transitioned to po Keflex which he completed on 1/15. Blood cultures from 1/8 continue to show NGTD. From 1/11 is NGTD. MRSA nares positive. - DC wound vac today, dry daily dressing changes - LE dopplers  negative for DVT x2, but evidence of hematoma - Appreciate ortho recs, will follow up with Dr. Sharol Given -norco PRN -zofran PRN -going to SNF today  Persistent oxygen requirement, Multifactorial. Postop atelectasis and anemia. Requiring 3L at 90-92%. Earlier a CT angio was done which ruled out PE but showed some atelectasis. EKG had shown NSR. Troponins unremarkable. Could be due to anemia causing increased oxygen requirement.  Pt using spirometer and has been OOB. CXR on 1/16 revealed persistent cardiomegaly, small bl pleural effusions, and questionable pulm vascular congestion.  - Redose 1x Lasix 40 mg  - Will provided prn Lasix Rx for weight gain 3 lb / 24 hr or 5 lbs in 1 week -continue to monitor oxygen requirement.  -maintain O2 sats above 92% -OOB and in the chair  -spirometer   T2DM with gastroparesis and peripheral neuropathy, last A1c 8.7 in 05/2016. Patient on home regimen of metformin 1000mg  BID, glipizide 5mg  daily and Lantus 10U qhs. CBGs 150s - Lantus 18 units daily  -SSI-M -reglan and gabapentin  Symptomatic anemia, 9.7-->6.5--> 5.9 > 5.9 ~ . Baseline around 10. Elevated ferritin in 01/2016. Likely anemia of chronic disease due to recurrent infections and dilutional component this hospital stay from IV fluids. Minimal blood loss in surgery. Ferritin elevated at 460. Given 1 dose of Feraheme. Pt is Jehovas Witness- no transfusions.  We will continue the prophylactic lovenox dose as pt is high risk of getting DVT. Retics barely elevated 3.1 upper limit of normal.   -- Check CBC weekly -- Folate and B12 supplement daily -- S/p feraheme on 1/12 -- Will not accept blood or EPO, regardless of albumin-free  HTN, resolved with starting home medications. Patient on home regimen of amlodipine 10mg  daily, lisinopril 40mg  daily and coreg 12.5mg  BID.  --continue home amlodipine 10mg  daily and coreg 12.5mg  BID --hold lisinopril 40mg  daily in setting of AKI, normotensive, can restart  outpatient  Bipolar disorder, patient on home regimen of depakote and fluoxetine. --continue depakote 1000mg  daily and fluoxetine 20mg  daily.  DVT: Lovenox   Dispo: Anticipated discharge to SNF today.  LOS: 9 days   Asencion Partridge, MD 07/03/2016, 7:58 AM

## 2016-07-03 NOTE — Progress Notes (Signed)
Made two calls to [facility] to give report on the patient, but was not able to get through. On one occasion the nurse was too busy to take report, and a 2nd occasion I was put on hold for 15 minutes and no one answered the call. Patient was discharged by [facility] in good condition.

## 2016-07-04 ENCOUNTER — Other Ambulatory Visit: Payer: Self-pay | Admitting: *Deleted

## 2016-07-04 MED ORDER — OXYCODONE-ACETAMINOPHEN 5-325 MG PO TABS: 1.0000 | ORAL_TABLET | Freq: Three times a day (TID) | ORAL | 0 refills | Status: DC | PRN

## 2016-07-04 NOTE — Telephone Encounter (Signed)
Shane [name]

## 2016-07-08 ENCOUNTER — Ambulatory Visit (INDEPENDENT_AMBULATORY_CARE_PROVIDER_SITE_OTHER): Payer: Medicaid Other | Admitting: Orthopedic Surgery

## 2016-07-08 ENCOUNTER — Encounter: Payer: Self-pay | Admitting: Adult Health

## 2016-07-08 ENCOUNTER — Non-Acute Institutional Stay (SKILLED_NURSING_FACILITY): Payer: Medicaid Other | Admitting: Adult Health

## 2016-07-08 DIAGNOSIS — K219 Gastro-esophageal reflux disease without esophagitis: Secondary | ICD-10-CM | POA: Diagnosis not present

## 2016-07-08 DIAGNOSIS — E785 Hyperlipidemia, unspecified: Secondary | ICD-10-CM

## 2016-07-08 DIAGNOSIS — IMO0002 Reserved for concepts with insufficient information to code with codable children: Secondary | ICD-10-CM

## 2016-07-08 DIAGNOSIS — Z89432 Acquired absence of left foot: Secondary | ICD-10-CM

## 2016-07-08 DIAGNOSIS — I10 Essential (primary) hypertension: Secondary | ICD-10-CM | POA: Diagnosis not present

## 2016-07-08 DIAGNOSIS — E1165 Type 2 diabetes mellitus with hyperglycemia: Secondary | ICD-10-CM | POA: Diagnosis not present

## 2016-07-08 DIAGNOSIS — K3184 Gastroparesis: Secondary | ICD-10-CM

## 2016-07-08 DIAGNOSIS — E1143 Type 2 diabetes mellitus with diabetic autonomic (poly)neuropathy: Secondary | ICD-10-CM

## 2016-07-08 DIAGNOSIS — M869 Osteomyelitis, unspecified: Secondary | ICD-10-CM

## 2016-07-08 DIAGNOSIS — Z794 Long term (current) use of insulin: Secondary | ICD-10-CM

## 2016-07-08 DIAGNOSIS — F418 Other specified anxiety disorders: Secondary | ICD-10-CM

## 2016-07-08 DIAGNOSIS — I5032 Chronic diastolic (congestive) heart failure: Secondary | ICD-10-CM

## 2016-07-08 DIAGNOSIS — E1169 Type 2 diabetes mellitus with other specified complication: Secondary | ICD-10-CM

## 2016-07-08 DIAGNOSIS — E118 Type 2 diabetes mellitus with unspecified complications: Secondary | ICD-10-CM

## 2016-07-08 NOTE — Progress Notes (Signed)
Location:  [facility] Room Number: 158 A Place of Service:  SNF (31)   CODE STATUS: Full Code  Allergies  Allergen Reactions  . Lactose Intolerance (Gi) Diarrhea and Other (See Comments)    bloating  . Other Other (See Comments)    Red meat causes stomach pains, bloating and diarrhea  . Milk-Related Compounds Diarrhea and Other (See Comments)    Any dairy products  - diarrhea and bloating    Chief Complaint  Patient presents with  . Hospitalization Follow-up    [facility] stay from 06/23/16 to 07/03/16.     HPI:  He has been hospitalized for left metatarsal osteomyelitis and foot cellulitis with a status post left transmetatarsal amputation. He has chronic anemia: he will not receive blood transfusions her is Jehovah's Witness. He is here for short term rehab with his goal to return back home.   Past Medical History:  Diagnosis Date  . Anemia   . Bipolar disorder ([place])   . Cellulitis and abscess of left leg 06/2016  . Chest pain    a. 2015 Reportedly normal stress test in [place].  Marland Kitchen Chronic diastolic CHF (congestive heart failure) (HCC)    a.03/2015 Echo: EF 55-60%, Gr 1 DD, mild MR, triv PR.  . Depression   . Depression with anxiety   . Dyspnea    with exertion  . GERD (gastroesophageal reflux disease)   . Hyperlipidemia   . Hypertension    a. 08/2014 Admitted with hypertensive urgency.  . Insomnia   . Internal carotid artery stenosis   . Lower GI bleed 07/04/2015  . Neuropathy ([place])   . Refusal of blood transfusions as patient is Jehovah's Witness   . TIA (transient ischemic attack) 08/2014; 03/2015   a. 08/2014 in setting of hypertensive urgency.  . Type II diabetes mellitus ([place])    started insulin spring 2016, Type II  . Vitamin D deficiency spring 2016    Past Surgical History:  Procedure Laterality Date  . AMPUTATION Left 08/03/2015   Procedure: AMPUTATION LEFT GREAT TOE;  Surgeon: Newt Minion, MD;  Location: [place];  Service: Orthopedics;  Laterality:  Left;  . AMPUTATION Left 12/02/2015   Procedure: amputation of left 2nd digit  . AMPUTATION Left 04/10/2016   Procedure: Left 2nd Toe Amputation at MTP Joint;  Surgeon: Newt Minion, MD;  Location: [place];  Service: Orthopedics;  Laterality: Left;  . AMPUTATION Left 06/09/2016   Procedure: AMPUTATION LEFT THIRD TOE;  Surgeon: [name], MD;  Location: [place];  Service: Orthopedics;  Laterality: Left;  . AMPUTATION Left 06/26/2016   Procedure: Left Foot Transmetatarsal Amputation;  Surgeon: Newt Minion, MD;  Location: [place];  Service: Orthopedics;  Laterality: Left;  . APPLICATION OF WOUND VAC Left 12/19/2015   Procedure: APPLICATION OF WOUND VAC;  Surgeon: Meredith Pel, MD;  Location: [place];  Service: Orthopedics;  Laterality: Left;  . CIRCUMCISION    . COLONOSCOPY N/A 01/22/2016   Procedure: COLONOSCOPY;  Surgeon: Gatha Mayer, MD;  Location: [place];  Service: Endoscopy;  Laterality: N/A;  . ESOPHAGOGASTRODUODENOSCOPY N/A 01/19/2016   Procedure: ESOPHAGOGASTRODUODENOSCOPY (EGD);  Surgeon: Manus Gunning, MD;  Location: [place];  Service: Gastroenterology;  Laterality: N/A;  . ESOPHAGOGASTRODUODENOSCOPY N/A 01/22/2016   Procedure: ESOPHAGOGASTRODUODENOSCOPY (EGD);  Surgeon: Gatha Mayer, MD;  Location: [facility] ENDOSCOPY;  Service: Endoscopy;  Laterality: N/A;  . I&D EXTREMITY Left 12/02/2015   Procedure: IRRIGATION AND DEBRIDEMENT OF FOOT; LEFT SECOND  TOE AMPUTATION;  Surgeon: Meredith Pel, MD;  Location: [place];  Service: Orthopedics;  Laterality: Left;  . I&D EXTREMITY Left 12/19/2015   Procedure: I & D LEFT FOOT WITH BEADS ;  Surgeon: Meredith Pel, MD;  Location: [place];  Service: Orthopedics;  Laterality: Left;  . I&D EXTREMITY Right 01/17/2016   Procedure: IRRIGATION AND DEBRIDEMENT RIGHT FOOT;  Surgeon: Newt Minion, MD;  Location: [place];  Service: Orthopedics;  Laterality: Right;  . INCISION AND DRAINAGE FOOT Right 01/17/2016  . INGUINAL HERNIA REPAIR Bilateral  ~ 1983- ~ 1986  . TONSILLECTOMY  ~ 6    Social History   Social History  . Marital status: Married    Spouse name: N/A  . Number of children: N/A  . Years of education: N/A   Occupational History  . Not on file.   Social History Main Topics  . Smoking status: Never Smoker  . Smokeless tobacco: Never Used  . Alcohol use No  . Drug use: No  . Sexual activity: No   Other Topics Concern  . Not on file   Social History Narrative   Lives in [place] with wife.  Active but doesn't routinely exercise.   Family History  Problem Relation Age of Onset  . Hypertension Mother   . Diabetes Mother   . Hyperlipidemia Mother   . Heart disease Mother     s/p pacemaker  . Diabetes Father   . Hypertension Father   . Stroke Father   . Heart attack Father     first MI @ 37.  . Stroke Brother       VITAL SIGNS BP 132/78   Pulse 78   Temp 97.6 F (36.4 C)   Resp 18   Ht 5\' 7"  (1.702 m)   Wt 216 lb 8 oz (98.2 kg)   SpO2 93%   BMI 33.91 kg/m   Patient's Medications  New Prescriptions   No medications on file  Previous Medications   AMLODIPINE (NORVASC) 10 MG TABLET    Take 1 tablet (10 mg total) by mouth daily.   ASPIRIN EC 81 MG TABLET    Take 1 tablet (81 mg total) by mouth daily.   ATORVASTATIN (LIPITOR) 80 MG TABLET    Take 1 tablet (80 mg total) by mouth every morning.   CARVEDILOL (COREG) 25 MG TABLET    Take 1 tablet (25 mg total) by mouth 2 (two) times daily with a meal.   DIVALPROEX (DEPAKOTE) 500 MG DR TABLET    Take 2 tablets (1,000 mg total) by mouth at bedtime.   FLUOXETINE (PROZAC) 20 MG TABLET    Take 1 tablet (20 mg total) by mouth every morning.   FOLIC ACID (FOLVITE) 1 MG TABLET    Take 1 tablet (1 mg total) by mouth daily.   FUROSEMIDE (LASIX) 40 MG TABLET    Take 1 tablet (40 mg total) by mouth daily as needed for fluid or edema (or weight gain 3 lbs in 1 day or 5 lbs in 1 week).   GABAPENTIN (NEURONTIN) 100 MG CAPSULE    Take 2 capsules (200 mg total) by  mouth 3 (three) times daily.   IBUPROFEN (ADVIL,MOTRIN) 800 MG TABLET    TAKE 1 TABLET BY MOUTH EVERY 8 HOURS AS NEEDED.   INSULIN GLARGINE (LANTUS SOLOSTAR) 100 UNIT/ML SOLOSTAR PEN    Inject 18 Units into the skin every morning.   METFORMIN (GLUCOPHAGE) 1000 MG TABLET    Take  1 tablet (1,000 mg total) by mouth 2 (two) times daily with a meal.   METOCLOPRAMIDE (REGLAN) 5 MG TABLET    Take 1 tablet (5 mg total) by mouth 3 (three) times daily before meals.   ONDANSETRON (ZOFRAN ODT) 8 MG DISINTEGRATING TABLET    Take 1 tablet (8 mg total) by mouth every 8 (eight) hours as needed for nausea or vomiting.   OXYCODONE-ACETAMINOPHEN (PERCOCET/ROXICET) 5-325 MG TABLET    Take 1-2 tablets by mouth every 8 (eight) hours as needed for moderate pain or severe pain. Do not exceed 3gm of APAP from all sources/24hrs   PANTOPRAZOLE (PROTONIX) 40 MG TABLET    Take 40 mg by mouth daily.    POLYETHYLENE GLYCOL (MIRALAX / GLYCOLAX) PACKET    Take 17 g by mouth daily as needed for moderate constipation.   RANITIDINE (ZANTAC) 300 MG TABLET    Take 1 tablet (300 mg total) by mouth at bedtime.   SENNA-DOCUSATE (SENOKOT-S) 8.6-50 MG TABLET    Take 1 tablet by mouth at bedtime.   TRAZODONE (DESYREL) 100 MG TABLET    Take 1 tablet (100 mg total) by mouth at bedtime.   TRIAMCINOLONE CREAM (KENALOG) 0.5 %    Apply to affected area topically every day and evening shift for affected area.   VITAMIN B-12 1000 MCG TABLET    Take 1 tablet (1,000 mcg total) by mouth daily.   VITAMIN D, ERGOCALCIFEROL, (DRISDOL) 50000 UNITS CAPS CAPSULE    Take 1 capsule (50,000 Units total) by mouth every 30 (thirty) days. Every 30 days  Modified Medications   No medications on file  Discontinued Medications   TRIAMCINOLONE OINTMENT (KENALOG) 0.5 %    Apply 1 application topically 2 (two) times daily.     SIGNIFICANT DIAGNOSTIC EXAMS  06-24-16: left lower extremity foot and ankle MRI: Severe diffuse cellulitis involving the entire imaged left  lower extremity. Two small abscesses distal to the second and third metatarsal heads. Suspect osteomyelitis involving the second and third metatarsal heads.  Cellulitis and diffuse myofasciitis involving the forefoot and midfoot regions.  06-27-16: ct angio of chest: 1. Negative for acute PE or thoracic aortic dissection. 2. Patchy areas of bilateral lower lobe atelectasis/consolidation, new since previous exam.   07-02-16: chest x-ray: 1. Small bilateral pleural effusions and left basilar linear atelectasis. 2. Cardiomegaly.  Cannot exclude mild pulmonary vascular congestion.   LABS REVIEWED;   06-23-16: wbc 8.8 hgb 9.7; hct 29.7; mcv 93.1; plt 230; glucose 227 bun 28; creat 1.42; k+ 4.5; na++ 139; liver normal albumin 3.5 06-27-16: wbc 10.0; hgb 6.5; hct 19.5; mcv 92.9; plt 166; glucose 222; bun 26; creat 1.47; k+ 4.9; na++ 136 06-28-16: iron 11; tibc 221; ferritin 436 07-03-16: wbc 9.8; hgb 5.9; hct 18.8; mcv 95.9; plt 397   Review of Systems  Constitutional: Negative for malaise/fatigue.  Respiratory: Negative for cough and shortness of breath.   Cardiovascular: Negative for chest pain, palpitations and leg swelling.  Gastrointestinal: Negative for abdominal pain, constipation and heartburn.  Musculoskeletal: Negative for back pain, joint pain and myalgias.  Skin: Negative.   Neurological: Negative for dizziness.  Psychiatric/Behavioral: The patient is not nervous/anxious.       Physical Exam  Constitutional: No distress.  Eyes: Conjunctivae are normal.  Neck: Neck supple. No JVD present. No thyromegaly present.  Cardiovascular: Normal rate, regular rhythm and intact distal pulses.   Respiratory: Effort normal and breath sounds normal. No respiratory distress. He has no wheezes.  GI: Soft.  Bowel sounds are normal. He exhibits no distension. There is no tenderness.  Musculoskeletal: He exhibits no edema.  Able to move all extremities  Status post left trans metatarsal amputation    Lymphadenopathy:    He has no cervical adenopathy.  Neurological: He is alert.  Skin: Skin is warm and dry. He is not diaphoretic.  Incision line without signs of infection   Psychiatric: He has a normal mood and affect.     ASSESSMENT/ PLAN:  1. Diastolic heart failure: will continue coreg 25 mg twice daily and lasix 40 mg daily as needed  2. Hypertension: will continue norvasc 10 mg daily coreg 25 mg twice daily   3. Diabetic gastroparesis: with GERD: will continue reglan 5 mg with meals; protonix 40 mg in the AM and zantac 300 mg in the PM  4. Dyslipidemia: will continue lipitor 80 mg daily   5. Diabetes: will continue metformin 1 gm twice daily and lantus 18 units nightly   6. Osteomyelitis of left foot: is status post left transmetatarsal amputation: will continue therapy as directed; will monitor has percocet 5/325 mg 1 or 2 tabs every 8 hours as needed for pain   7. Depression with anxiety: will continue prozac 20 mg daily; trazodone 100 mg nightly and takes depakote 1 gm nightly to stabilize mood.   8, peripheral neuropathy: will continue neurontin 200 mg three times daily   9. Anemia of chronic disease; no transfusions as is Jehovah Witness; hgb 5.9; will monitor     Time spent with patient  50  minutes >50% time spent counseling; reviewing medical record; tests; labs; and developing future plan of care   MD is aware of resident's narcotic use and is in agreement with current plan of care. We will attempt to wean resident as apropriate   Ok Edwards NP [facility]  Contact [phone] Monday through Friday 8am- 5pm  After hours call [phone]

## 2016-07-08 NOTE — Progress Notes (Signed)
Office Visit Note   Patient: Shane [name]           Date of Birth: 09/11/1970           MRN: [id number] Visit Date: 07/08/2016              Requested by: Shane Nearing, MD [place], [place] [address] PCP: Shane Ends, MD  No chief complaint on file.   HPI: Patient is a 46 year old gentleman seen today about 3 weeks status post left transmetatarsal amputation. Has been residing at [facility]. Sutures remain in place. Is healing well. Has been nonweight bearing in a wheelchair.    Assessment & Plan: Visit Diagnoses:  1. S/P transmetatarsal amputation of foot, left ([place])     Plan: Sutures harvested today without incident. Advised him to keep this wrapped with a dry dressing until his wife is able to bring him his vive compression stockings. Will wear these daily. Have provided him with an order for a carbon fiber plate with a spacer next reduction shoes to [facility]. Follow-up in the office in 2 more weeks  Follow-Up Instructions: Return in about 2 weeks (around 07/22/2016).   Ortho Exam The foot incision well approximated with sutures healing well. There is no gaping no depths no drainage today. No surrounding erythema or odor. Does have moderate swelling to the foot.  Imaging: No results found.  Orders:  No orders of the defined types were placed in this encounter.  No orders of the defined types were placed in this encounter.    Procedures: No procedures performed  Clinical Data: No additional findings.  Subjective: Review of Systems  Constitutional: Negative for chills and fever.    Objective: Vital Signs: There were no vitals taken for this visit.  Specialty Comments:  No specialty comments available.  PMFS History: Patient Active Problem List   Diagnosis Date Noted  . Constipation 07/02/2016  . Osteomyelitis of left foot ([place]) 07/02/2016  . S/P transmetatarsal amputation of foot, left ([place]) 07/02/2016  . Dyspnea on exertion  07/02/2016  . Cellulitis of left lower leg 06/24/2016  . Cellulitis of left lower extremity   . Subacute osteomyelitis, left ankle and foot ([place]) 06/08/2016  . Diabetic gastroparesis ([place]) 05/30/2016  . Chronic ulcer of left foot ([place]) 05/22/2016  . Dermatitis 04/26/2016  . Buzzing in ear, right 03/26/2016  . Joint pain 03/11/2016  . Weakness 03/08/2016  . Dizziness 03/03/2016  . Diabetic ulcer of right foot associated with type 2 diabetes mellitus, with necrosis of muscle ([place]) 02/21/2016  . Chronic diastolic CHF (congestive heart failure), NYHA class 1 ([place]) 12/30/2015  . Diabetes mellitus type 2, uncontrolled, with complications ([place]) [id number]  . Depression with anxiety 12/01/2015  . Pain in the chest 12/01/2015  . Insomnia 11/21/2015  . GERD (gastroesophageal reflux disease) 08/18/2015  . Abscess of third toe, left 08/01/2015  . Erectile dysfunction 07/04/2015  . Symptomatic anemia 04/24/2015  . AKI (acute kidney injury) ([place]) 04/24/2015  . Left arm weakness 04/02/2015  . Sensory disturbance 04/02/2015  . Essential hypertension 04/02/2015  . Hemispheric carotid artery syndrome   . HLD (hyperlipidemia)   . Headache    Past Medical History:  Diagnosis Date  . Anemia   . Bipolar disorder ([place])   . Cellulitis and abscess of left leg 06/2016  . Chest pain    a. 2015 Reportedly normal stress test in [place].  Marland Kitchen Chronic diastolic CHF (congestive heart failure) ([place])  a.03/2015 Echo: EF 55-60%, Gr 1 DD, mild MR, triv PR.  . Depression   . Depression with anxiety   . Dyspnea    with exertion  . GERD (gastroesophageal reflux disease)   . Hyperlipidemia   . Hypertension    a. 08/2014 Admitted with hypertensive urgency.  . Insomnia   . Internal carotid artery stenosis   . Lower GI bleed 07/04/2015  . Neuropathy ([place])   . Refusal of blood transfusions as patient is Jehovah's Witness   . TIA (transient ischemic attack) 08/2014; 03/2015   a. 08/2014 in setting of hypertensive  urgency.  . Type II diabetes mellitus ([place])    started insulin spring 2016, Type II  . Vitamin D deficiency spring 2016    Family History  Problem Relation Age of Onset  . Hypertension Mother   . Diabetes Mother   . Hyperlipidemia Mother   . Heart disease Mother     s/p pacemaker  . Diabetes Father   . Hypertension Father   . Stroke Father   . Heart attack Father     first MI @ 77.  . Stroke Brother     Past Surgical History:  Procedure Laterality Date  . AMPUTATION Left 08/03/2015   Procedure: AMPUTATION LEFT GREAT TOE;  Surgeon: Newt Minion, MD;  Location: [place];  Service: Orthopedics;  Laterality: Left;  . AMPUTATION Left 12/02/2015   Procedure: amputation of left 2nd digit  . AMPUTATION Left 04/10/2016   Procedure: Left 2nd Toe Amputation at MTP Joint;  Surgeon: Newt Minion, MD;  Location: [place];  Service: Orthopedics;  Laterality: Left;  . AMPUTATION Left 06/09/2016   Procedure: AMPUTATION LEFT THIRD TOE;  Surgeon: [name], MD;  Location: [place];  Service: Orthopedics;  Laterality: Left;  . AMPUTATION Left 06/26/2016   Procedure: Left Foot Transmetatarsal Amputation;  Surgeon: Newt Minion, MD;  Location: [place];  Service: Orthopedics;  Laterality: Left;  . APPLICATION OF WOUND VAC Left 12/19/2015   Procedure: APPLICATION OF WOUND VAC;  Surgeon: Meredith Pel, MD;  Location: [place];  Service: Orthopedics;  Laterality: Left;  . CIRCUMCISION    . COLONOSCOPY N/A 01/22/2016   Procedure: COLONOSCOPY;  Surgeon: Gatha Mayer, MD;  Location: [place];  Service: Endoscopy;  Laterality: N/A;  . ESOPHAGOGASTRODUODENOSCOPY N/A 01/19/2016   Procedure: ESOPHAGOGASTRODUODENOSCOPY (EGD);  Surgeon: Manus Gunning, MD;  Location: [place];  Service: Gastroenterology;  Laterality: N/A;  . ESOPHAGOGASTRODUODENOSCOPY N/A 01/22/2016   Procedure: ESOPHAGOGASTRODUODENOSCOPY (EGD);  Surgeon: Gatha Mayer, MD;  Location: [facility] ENDOSCOPY;  Service: Endoscopy;  Laterality: N/A;  .  I&D EXTREMITY Left 12/02/2015   Procedure: IRRIGATION AND DEBRIDEMENT OF FOOT; LEFT SECOND TOE AMPUTATION;  Surgeon: Meredith Pel, MD;  Location: [place];  Service: Orthopedics;  Laterality: Left;  . I&D EXTREMITY Left 12/19/2015   Procedure: I & D LEFT FOOT WITH BEADS ;  Surgeon: Meredith Pel, MD;  Location: [place];  Service: Orthopedics;  Laterality: Left;  . I&D EXTREMITY Right 01/17/2016   Procedure: IRRIGATION AND DEBRIDEMENT RIGHT FOOT;  Surgeon: Newt Minion, MD;  Location: [place];  Service: Orthopedics;  Laterality: Right;  . INCISION AND DRAINAGE FOOT Right 01/17/2016  . INGUINAL HERNIA REPAIR Bilateral ~ 1983- ~ 1986  . TONSILLECTOMY  ~ 43   Social History   Occupational History  . Not on file.   Social History Main Topics  . Smoking status: Never Smoker  . Smokeless tobacco: Never  Used  . Alcohol use No  . Drug use: No  . Sexual activity: No

## 2016-07-09 ENCOUNTER — Encounter: Payer: Self-pay | Admitting: Internal Medicine

## 2016-07-09 ENCOUNTER — Non-Acute Institutional Stay (SKILLED_NURSING_FACILITY): Payer: Medicaid Other | Admitting: Internal Medicine

## 2016-07-09 DIAGNOSIS — I5032 Chronic diastolic (congestive) heart failure: Secondary | ICD-10-CM | POA: Diagnosis not present

## 2016-07-09 DIAGNOSIS — E1165 Type 2 diabetes mellitus with hyperglycemia: Secondary | ICD-10-CM | POA: Diagnosis not present

## 2016-07-09 DIAGNOSIS — E785 Hyperlipidemia, unspecified: Secondary | ICD-10-CM

## 2016-07-09 DIAGNOSIS — D649 Anemia, unspecified: Secondary | ICD-10-CM

## 2016-07-09 DIAGNOSIS — N183 Chronic kidney disease, stage 3 unspecified: Secondary | ICD-10-CM

## 2016-07-09 DIAGNOSIS — K219 Gastro-esophageal reflux disease without esophagitis: Secondary | ICD-10-CM | POA: Diagnosis not present

## 2016-07-09 DIAGNOSIS — F418 Other specified anxiety disorders: Secondary | ICD-10-CM

## 2016-07-09 DIAGNOSIS — Z89432 Acquired absence of left foot: Secondary | ICD-10-CM | POA: Diagnosis not present

## 2016-07-09 DIAGNOSIS — IMO0002 Reserved for concepts with insufficient information to code with codable children: Secondary | ICD-10-CM

## 2016-07-09 DIAGNOSIS — Z794 Long term (current) use of insulin: Secondary | ICD-10-CM

## 2016-07-09 DIAGNOSIS — I10 Essential (primary) hypertension: Secondary | ICD-10-CM | POA: Diagnosis not present

## 2016-07-09 DIAGNOSIS — E1169 Type 2 diabetes mellitus with other specified complication: Secondary | ICD-10-CM | POA: Diagnosis not present

## 2016-07-09 DIAGNOSIS — E118 Type 2 diabetes mellitus with unspecified complications: Secondary | ICD-10-CM

## 2016-07-09 NOTE — Progress Notes (Signed)
Patient ID: Shane [name], male   DOB: 28-Apr-1971, 46 y.o.   MRN: [id number]    HISTORY AND PHYSICAL   DATE:  07/09/2016  Location:    [facility] Room Number: 158 A Place of Service: SNF (31)   Extended Emergency Contact Information Primary Emergency Contact: Soden,Flora Address: [address]          [place], [place] [address] Johnnette Litter of [place] Phone: [phone] Mobile Phone: [phone] Relation: Spouse  Advanced Directive information Does Patient Have a Medical Advance Directive?: No  Chief Complaint  Patient presents with  . 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 He is a Jehovah Witness. He underwent a left TMA on 1/10th by Dr Sharol Given. Hgb dropped to 5.9 (from 9.7) and he declined PRBC transfusion due to religious beliefs. He rec'd 1 dose of IV feraheme. Ferritin 436; serum iron 11; A1c 8.7% (05/2016); Cr 1.42 -->1.24; albumin 3.5 at d/c. He presents to SNF for short term rehab.  Today he reports no SOB. He was seen by Ortho yesterday and staples removed. No f/c. No HA/dizziness. Appetite ok. sleeps well. Pain stable on percocet 5/325 mg 1 or 2 tabs every 8 hours as needed. He does benefit form this dose.  Chronic Diastolic heart failure - stable on coreg 25 mg twice daily and lasix 40 mg daily as needed.   Hypertension - stable on norvasc 10 mg daily; coreg 25 mg twice daily   GERD - stable on protonix 40 mg in the AM and zantac 300 mg in the PM  Dyslipidemia - stable on lipitor 80 mg daily. LDL 54   DM - poorly controlled. A1c 8.7%. CBG 187 today.  He takes metformin 1 gm twice daily and lantus 18 units nightly. He has neuropathy which is stable on neurontin 200 mg three times daily. He also has gastroparesis that is stable on reglan qAC  Depression with anxiety - mood stable on prozac  20 mg daily; trazodone 100 mg nightly; depakote 1 gm nightly    Anemia of chronic disease - no PRBC transfusions as he is a Jehovah Witness; Hgb 5.9 at d/c with ferritin 436 and serum iron 11. He has rec'd 1 dose of IV feraheme  Past Medical History:  Diagnosis Date  . Anemia   . Bipolar disorder ([facility])   . Cellulitis and abscess of left leg 06/2016  . Chest pain    a. 2015 Reportedly normal stress test in [place].  Marland Kitchen Chronic diastolic CHF (congestive heart failure) ([facility])    a.03/2015 Echo: EF 55-60%, Gr 1 DD, mild MR, triv PR.  . Depression   . Depression with anxiety   . Dyspnea    with exertion  . GERD (gastroesophageal reflux disease)   . Hyperlipidemia   . Hypertension    a. 08/2014 Admitted with hypertensive urgency.  . Insomnia   . Internal carotid artery stenosis   . Lower GI bleed 07/04/2015  . Neuropathy ([facility])   . Refusal of blood transfusions as patient is Jehovah's Witness   . TIA (transient ischemic attack) 08/2014; 03/2015   a. 08/2014 in setting of hypertensive urgency.  . Type II diabetes mellitus ([facility])    started insulin spring 2016, Type II  . Vitamin D deficiency spring 2016    Past Surgical History:  Procedure  Laterality Date  . AMPUTATION Left 08/03/2015   Procedure: AMPUTATION LEFT GREAT TOE;  Surgeon: Newt Minion, MD;  Location: [facility];  Service: Orthopedics;  Laterality: Left;  . AMPUTATION Left 12/02/2015   Procedure: amputation of left 2nd digit  . AMPUTATION Left 04/10/2016   Procedure: Left 2nd Toe Amputation at MTP Joint;  Surgeon: Newt Minion, MD;  Location: [facility];  Service: Orthopedics;  Laterality: Left;  . AMPUTATION Left 06/09/2016   Procedure: AMPUTATION LEFT THIRD TOE;  Surgeon: [name], MD;  Location: [facility];  Service: Orthopedics;  Laterality: Left;  . AMPUTATION Left 06/26/2016   Procedure: Left Foot Transmetatarsal Amputation;  Surgeon: Newt Minion, MD;  Location: [facility];  Service: Orthopedics;  Laterality: Left;  . APPLICATION OF WOUND  VAC Left 12/19/2015   Procedure: APPLICATION OF WOUND VAC;  Surgeon: Meredith Pel, MD;  Location: [facility];  Service: Orthopedics;  Laterality: Left;  . CIRCUMCISION    . COLONOSCOPY N/A 01/22/2016   Procedure: COLONOSCOPY;  Surgeon: Gatha Mayer, MD;  Location: [facility];  Service: Endoscopy;  Laterality: N/A;  . ESOPHAGOGASTRODUODENOSCOPY N/A 01/19/2016   Procedure: ESOPHAGOGASTRODUODENOSCOPY (EGD);  Surgeon: Manus Gunning, MD;  Location: [facility];  Service: Gastroenterology;  Laterality: N/A;  . ESOPHAGOGASTRODUODENOSCOPY N/A 01/22/2016   Procedure: ESOPHAGOGASTRODUODENOSCOPY (EGD);  Surgeon: Gatha Mayer, MD;  [facility] END[facility]COPY;  Service: Endoscopy;  Laterality: N/A;  . I&D EXTREMITY Left 12/02/2015   Procedure: IRRIGATION AND DEBRIDEMENT OF FOOT; LEFT SECOND TOE AMPUTATION;  Surgeon: Meredith Pel, MD;  Location: [facility];  Service: Orthopedics;  Laterality: Left;  . I&D EXTREMITY Left 12/19/2015   Procedure: I & D LEFT FOOT WITH BEADS ;  Surgeon: Meredith Pel, MD;  Location: [facility];  Service: Orthopedics;  Laterality: Left;  . I&D EXTREMITY Right 01/17/2016   Procedure: IRRIGATION AND DEBRIDEMENT RIGHT FOOT;  Surgeon: Newt Minion, MD;  Location: [facility];  Service: Orthopedics;  Laterality: Right;  . INCISION AND DRAINAGE FOOT Right 01/17/2016  . INGUINAL HERNIA REPAIR Bilateral ~ 1983- ~ 1986  . TONSILLECTOMY  ~ 1985    Patient Care Team: Boykin Nearing, MD as PCP - General (Family Medicine)  Social History   Social History  . Marital status: Married    Spouse name: N/A  . Number of children: N/A  . Years of education: N/A   Occupational History  . Not on file.   Social History Main Topics  . Smoking status: Never Smoker  . Smokeless tobacco: Never Used  . Alcohol use No  . Drug use: No  . Sexual activity: No   Other Topics Concern  . Not on file   Social History Narrative   Lives in [place] with wife.  Active but doesn't routinely exercise.      reports that he has never smoked. He has never used smokeless tobacco. He reports that he does not drink alcohol or use drugs.  Family History  Problem Relation Age of Onset  . Hypertension Mother   . Diabetes Mother   . Hyperlipidemia Mother   . Heart disease Mother     s/p pacemaker  . Diabetes Father   . Hypertension Father   . Stroke Father   . Heart attack Father     first MI @ 7.  . Stroke Brother    Family Status  Relation Status  . Mother Alive  . Father Deceased  . Brother     Immunization History  Administered Date(s) Administered  . Influenza,inj,Quad PF,36+ Mos 04/07/2015, 03/11/2016  . Pneumococcal Polysaccharide-23 09/14/2014, 02/23/2016  . Tdap 01/22/2015    Allergies  Allergen Reactions  . Lactose Intolerance (Gi) Diarrhea and Other (See Comments)    bloating  . Other Other (See Comments)    Red meat causes stomach pains, bloating and diarrhea  . Milk-Related Compounds Diarrhea and Other (See Comments)    Any dairy products  - diarrhea and bloating    Medications: Patient's Medications  New Prescriptions   No medications on file  Previous Medications   AMLODIPINE (NORVASC) 10 MG TABLET    Take 1 tablet (10 mg total) by mouth daily.   ASPIRIN EC 81 MG TABLET    Take 1 tablet (81 mg total) by mouth daily.   ATORVASTATIN (LIPITOR) 80 MG TABLET    Take 1 tablet (80 mg total) by mouth every morning.   CARVEDILOL (COREG) 25 MG TABLET    Take 1 tablet (25 mg total) by mouth 2 (two) times daily with a meal.   DIVALPROEX (DEPAKOTE) 500 MG DR TABLET    Take 2 tablets (1,000 mg total) by mouth at bedtime.   FLUOXETINE (PROZAC) 20 MG TABLET    Take 1 tablet (20 mg total) by mouth every morning.   FOLIC ACID (FOLVITE) 1 MG TABLET    Take 1 tablet (1 mg total) by mouth daily.   FUROSEMIDE (LASIX) 40 MG TABLET    Take 1 tablet (40 mg total) by mouth daily as needed for fluid or edema (or weight gain 3 lbs in 1 day or 5 lbs in 1 week).   GABAPENTIN  (NEURONTIN) 100 MG CAPSULE    Take 2 capsules (200 mg total) by mouth 3 (three) times daily.   IBUPROFEN (ADVIL,MOTRIN) 800 MG TABLET    TAKE 1 TABLET BY MOUTH EVERY 8 HOURS AS NEEDED.   INSULIN GLARGINE (LANTUS SOLOSTAR) 100 UNIT/ML SOLOSTAR PEN    Inject 18 Units into the skin every morning.   METFORMIN (GLUCOPHAGE) 1000 MG TABLET    Take 1 tablet (1,000 mg total) by mouth 2 (two) times daily with a meal.   METOCLOPRAMIDE (REGLAN) 5 MG TABLET    Take 1 tablet (5 mg total) by mouth 3 (three) times daily before meals.   ONDANSETRON (ZOFRAN ODT) 8 MG DISINTEGRATING TABLET    Take 1 tablet (8 mg total) by mouth every 8 (eight) hours as needed for nausea or vomiting.   OXYCODONE-ACETAMINOPHEN (PERCOCET/ROXICET) 5-325 MG TABLET    Take 1-2 tablets by mouth every 8 (eight) hours as needed for moderate pain or severe pain. Do not exceed 3gm of APAP from all sources/24hrs   PANTOPRAZOLE (PROTONIX) 40 MG TABLET    Take 40 mg by mouth daily.    POLYETHYLENE GLYCOL (MIRALAX / GLYCOLAX) PACKET    Take 17 g by mouth daily as needed for moderate constipation.   RANITIDINE (ZANTAC) 300 MG TABLET    Take 1 tablet (300 mg total) by mouth at bedtime.   SENNA-DOCUSATE (SENOKOT-S) 8.6-50 MG TABLET    Take 1 tablet by mouth at bedtime.   TRAZODONE (DESYREL) 100 MG TABLET    Take 1 tablet (100 mg total) by mouth at bedtime.   TRIAMCINOLONE CREAM (KENALOG) 0.5 %    Apply to affected area topically every day and evening shift for affected area.   VITAMIN B-12 1000 MCG TABLET    Take 1 tablet (1,000 mcg total) by mouth daily.   VITAMIN D,  ERGOCALCIFEROL, (DRISDOL) 50000 UNITS CAPS CAPSULE    Take 1 capsule (50,000 Units total) by mouth every 30 (thirty) days. Every 30 days  Modified Medications   No medications on file  Discontinued Medications   No medications on file    Review of Systems  Respiratory: Negative for shortness of breath.   Musculoskeletal: Positive for gait problem.  Skin: Positive for wound.   Psychiatric/Behavioral: Positive for dysphoric mood.  All other systems reviewed and are negative.   Vitals:   07/09/16 1208  BP: 132/78  Pulse: 78  Resp: 18  Temp: 97.6 F (36.4 C)  TempSrc: Oral  SpO2: 93%  Weight: 188 lb 12.8 oz (85.6 kg)  Height: 5' 7"  (1.702 m)   Body mass index is 29.57 kg/m.  Physical Exam  Constitutional: He is oriented to person, place, and time. He appears well-developed and well-nourished.  Looks tired in NAD  HENT:  Mouth/Throat: Oropharynx is clear and moist. No oropharyngeal exudate.  MMM; no oral thrush  Eyes: Pupils are equal, round, and reactive to light. No scleral icterus.  Pale conjunctivae b/l  Neck: Neck supple. Carotid bruit is not present. No thyromegaly present.  Cardiovascular: Normal rate, regular rhythm and intact distal pulses.  Exam reveals no gallop and no friction rub.   Murmur (1/6 SEM) heard. Trace LLE edema. No RLE edema. No calf TTP b/l  Pulmonary/Chest: Effort normal and breath sounds normal. He has no wheezes. He has no rales. He exhibits no tenderness.  Abdominal: Soft. Bowel sounds are normal. He exhibits no distension, no abdominal bruit, no pulsatile midline mass and no mass. There is no hepatomegaly. There is no tenderness. There is no rebound and no guarding.  Musculoskeletal: He exhibits edema and deformity (left TMA).  Lymphadenopathy:    He has no cervical adenopathy.  Neurological: He is alert and oriented to person, place, and time.  Skin: Skin is warm and dry. No rash noted.  Left TMA dsg c/d/I. No increased redness or d/c noted. Followed by wound care  Psychiatric: He has a normal mood and affect. His behavior is normal. Judgment and thought content normal.     Labs reviewed: Admission on 06/23/2016, Discharged on 07/03/2016  No results displayed because visit has over 200 results.    Office Visit on 06/14/2016  Component Date Value Ref Range Status  . POC Glucose 06/14/2016 200* 70 - 99 mg/dl Final   Admission on 06/13/2016, Discharged on 06/13/2016  Component Date Value Ref Range Status  . Sodium 06/13/2016 138  135 - 145 mmol/L Final  . Potassium 06/13/2016 4.5  3.5 - 5.1 mmol/L Final  . Chloride 06/13/2016 104  101 - 111 mmol/L Final  . CO2 06/13/2016 26  22 - 32 mmol/L Final  . Glucose, Bld 06/13/2016 193* 65 - 99 mg/dL Final  . BUN 06/13/2016 12  6 - 20 mg/dL Final  . Creatinine, Ser 06/13/2016 0.93  0.61 - 1.24 mg/dL Final  . Calcium 06/13/2016 9.0  8.9 - 10.3 mg/dL Final  . Total Protein 06/13/2016 7.1  6.5 - 8.1 g/dL Final  . Albumin 06/13/2016 3.2* 3.5 - 5.0 g/dL Final  . AST 06/13/2016 20  15 - 41 U/L Final  . ALT 06/13/2016 23  17 - 63 U/L Final  . Alkaline Phosphatase 06/13/2016 49  38 - 126 U/L Final  . Total Bilirubin 06/13/2016 0.2* 0.3 - 1.2 mg/dL Final  . GFR calc non Af Amer 06/13/2016 >60  >60 mL/min Final  . GFR  calc Af Amer 06/13/2016 >60  >60 mL/min Final   Comment: (NOTE) The eGFR has been calculated using the CKD EPI equation. This calculation has not been validated in all clinical situations. eGFR's persistently <60 mL/min signify possible Chronic Kidney Disease.   . Anion gap 06/13/2016 8  5 - 15 Final  . Lactic Acid, Venous 06/13/2016 1.97* 0.5 - 1.9 mmol/L Final  . WBC 06/13/2016 8.2  4.0 - 10.5 K/uL Final  . RBC 06/13/2016 3.43* 4.22 - 5.81 MIL/uL Final  . Hemoglobin 06/13/2016 10.3* 13.0 - 17.0 g/dL Final  . HCT 06/13/2016 31.7* 39.0 - 52.0 % Final  . MCV 06/13/2016 92.4  78.0 - 100.0 fL Final  . MCH 06/13/2016 30.0  26.0 - 34.0 pg Final  . MCHC 06/13/2016 32.5  30.0 - 36.0 g/dL Final  . RDW 06/13/2016 15.0  11.5 - 15.5 % Final  . Platelets 06/13/2016 240  150 - 400 K/uL Final  . Neutrophils Relative % 06/13/2016 78  % Final  . Neutro Abs 06/13/2016 6.3  1.7 - 7.7 K/uL Final  . Lymphocytes Relative 06/13/2016 16  % Final  . Lymphs Abs 06/13/2016 1.3  0.7 - 4.0 K/uL Final  . Monocytes Relative 06/13/2016 4  % Final  . Monocytes Absolute  06/13/2016 0.4  0.1 - 1.0 K/uL Final  . Eosinophils Relative 06/13/2016 2  % Final  . Eosinophils Absolute 06/13/2016 0.2  0.0 - 0.7 K/uL Final  . Basophils Relative 06/13/2016 0  % Final  . Basophils Absolute 06/13/2016 0.0  0.0 - 0.1 K/uL Final  . Lactic Acid, Venous 06/13/2016 1.36  0.5 - 1.9 mmol/L Final  . Troponin i, poc 06/13/2016 0.00  0.00 - 0.08 ng/mL Final  . Comment 3 06/13/2016          Final   Comment: Due to the release kinetics of cTnI, a negative result within the first hours of the onset of symptoms does not rule out myocardial infarction with certainty. If myocardial infarction is still suspected, repeat the test at appropriate intervals.   Marland Kitchen D-Dimer, Quant 06/13/2016 0.86* 0.00 - 0.50 ug/mL-FEU Final   Comment: (NOTE) At the manufacturer cut-off of 0.50 ug/mL FEU, this assay has been documented to exclude PE with a sensitivity and negative predictive value of 97 to 99%.  At this time, this assay has not been approved by the FDA to exclude DVT/VTE. Results should be correlated with clinical presentation.   Marland Kitchen Specimen Description 06/13/2016 BLOOD RIGHT ARM   Final  . Special Requests 06/13/2016 BOTTLES DRAWN AEROBIC AND ANAEROBIC 5CC   Final  . Culture 06/13/2016 NO GROWTH 5 DAYS   Final  . Report Status 06/13/2016 06/18/2016 FINAL   Final  . Specimen Description 06/13/2016 BLOOD LEFT ARM   Final  . Special Requests 06/13/2016 IN PEDIATRIC BOTTLE 1CC   Final  . Culture 06/13/2016 NO GROWTH 5 DAYS   Final  . Report Status 06/13/2016 06/18/2016 FINAL   Final  . Specimen Description 06/13/2016 URINE, CLEAN CATCH   Final  . Special Requests 06/13/2016 Normal   Final  . Culture 06/13/2016 NO GROWTH   Final  . Report Status 06/13/2016 06/14/2016 FINAL   Final  . Color, Urine 06/13/2016 YELLOW  YELLOW Final  . APPearance 06/13/2016 CLEAR  CLEAR Final  . Specific Gravity, Urine 06/13/2016 1.016  1.005 - 1.030 Final  . pH 06/13/2016 6.0  5.0 - 8.0 Final  . Glucose, UA  06/13/2016 NEGATIVE  NEGATIVE mg/dL Final  .  Hgb urine dipstick 06/13/2016 SMALL* NEGATIVE Final  . Bilirubin Urine 06/13/2016 NEGATIVE  NEGATIVE Final  . Ketones, ur 06/13/2016 NEGATIVE  NEGATIVE mg/dL Final  . Protein, ur 06/13/2016 100* NEGATIVE mg/dL Final  . Nitrite 06/13/2016 NEGATIVE  NEGATIVE Final  . Leukocytes, UA 06/13/2016 NEGATIVE  NEGATIVE Final  . RBC / HPF 06/13/2016 0-5  0 - 5 RBC/hpf Final  . WBC, UA 06/13/2016 0-5  0 - 5 WBC/hpf Final  . Bacteria, UA 06/13/2016 RARE* NONE SEEN Final  . Squamous Epithelial / LPF 06/13/2016 NONE SEEN  NONE SEEN Final  . Mucous 06/13/2016 PRESENT   Final  . Hyaline Casts, UA 06/13/2016 PRESENT   Final  . Troponin i, poc 06/13/2016 0.00  0.00 - 0.08 ng/mL Final  . Comment 3 06/13/2016          Final   Comment: Due to the release kinetics of cTnI, a negative result within the first hours of the onset of symptoms does not rule out myocardial infarction with certainty. If myocardial infarction is still suspected, repeat the test at appropriate intervals.   Admission on 06/08/2016, Discharged on 06/10/2016  Component Date Value Ref Range Status  . Sodium 06/08/2016 137  135 - 145 mmol/L Final  . Potassium 06/08/2016 5.5* 3.5 - 5.1 mmol/L Final  . Chloride 06/08/2016 103  101 - 111 mmol/L Final  . CO2 06/08/2016 29  22 - 32 mmol/L Final  . Glucose, Bld 06/08/2016 303* 65 - 99 mg/dL Final  . BUN 06/08/2016 18  6 - 20 mg/dL Final  . Creatinine, Ser 06/08/2016 1.19  0.61 - 1.24 mg/dL Final  . Calcium 06/08/2016 9.3  8.9 - 10.3 mg/dL Final  . GFR calc non Af Amer 06/08/2016 >60  >60 mL/min Final  . GFR calc Af Amer 06/08/2016 >60  >60 mL/min Final   Comment: (NOTE) The eGFR has been calculated using the CKD EPI equation. This calculation has not been validated in all clinical situations. eGFR's persistently <60 mL/min signify possible Chronic Kidney Disease.   . Anion gap 06/08/2016 5  5 - 15 Final  . WBC 06/08/2016 7.8  4.0 - 10.5 K/uL  Final  . RBC 06/08/2016 3.42* 4.22 - 5.81 MIL/uL Final  . Hemoglobin 06/08/2016 10.0* 13.0 - 17.0 g/dL Final  . HCT 06/08/2016 31.0* 39.0 - 52.0 % Final  . MCV 06/08/2016 90.6  78.0 - 100.0 fL Final  . MCH 06/08/2016 29.2  26.0 - 34.0 pg Final  . MCHC 06/08/2016 32.3  30.0 - 36.0 g/dL Final  . RDW 06/08/2016 14.9  11.5 - 15.5 % Final  . Platelets 06/08/2016 235  150 - 400 K/uL Final  . Neutrophils Relative % 06/08/2016 69  % Final  . Neutro Abs 06/08/2016 5.5  1.7 - 7.7 K/uL Final  . Lymphocytes Relative 06/08/2016 22  % Final  . Lymphs Abs 06/08/2016 1.7  0.7 - 4.0 K/uL Final  . Monocytes Relative 06/08/2016 6  % Final  . Monocytes Absolute 06/08/2016 0.4  0.1 - 1.0 K/uL Final  . Eosinophils Relative 06/08/2016 3  % Final  . Eosinophils Absolute 06/08/2016 0.2  0.0 - 0.7 K/uL Final  . Basophils Relative 06/08/2016 0  % Final  . Basophils Absolute 06/08/2016 0.0  0.0 - 0.1 K/uL Final  . Lactic Acid, Venous 06/08/2016 1.6  0.5 - 1.9 mmol/L Final  . Lactic Acid, Venous 06/08/2016 0.7  0.5 - 1.9 mmol/L Final  . Specimen Description 06/08/2016 BLOOD RIGHT FOREARM   Final  .  Special Requests 06/08/2016 BOTTLES DRAWN AEROBIC AND ANAEROBIC 5CC   Final  . Culture 06/08/2016 NO GROWTH 5 DAYS   Final  . Report Status 06/08/2016 06/13/2016 FINAL   Final  . Specimen Description 06/08/2016 BLOOD LEFT ANTECUBITAL   Final  . Special Requests 06/08/2016 BOTTLES DRAWN AEROBIC AND ANAEROBIC 5CC   Final  . Culture 06/08/2016 NO GROWTH 5 DAYS   Final  . Report Status 06/08/2016 06/13/2016 FINAL   Final  . Sed Rate 06/08/2016 27* 0 - 16 mm/hr Final  . Hgb A1c MFr Bld 06/08/2016 8.7* 4.8 - 5.6 % Final   Comment: (NOTE)         Pre-diabetes: 5.7 - 6.4         Diabetes: >6.4         Glycemic control for adults with diabetes: <7.0   . Mean Plasma Glucose 06/08/2016 203  mg/dL Final   Comment: (NOTE) Performed At: [facility] [place], [place] [id number] Lindon Romp MD  [id number]   . Glucose-Capillary 06/08/2016 153* 65 - 99 mg/dL Final  . Glucose-Capillary 06/09/2016 162* 65 - 99 mg/dL Final  . Glucose-Capillary 06/09/2016 154* 65 - 99 mg/dL Final  . Sodium 06/09/2016 141  135 - 145 mmol/L Final  . Potassium 06/09/2016 4.6  3.5 - 5.1 mmol/L Final  . Glucose, Bld 06/09/2016 138* 65 - 99 mg/dL Final  . HCT 06/09/2016 34.0* 39.0 - 52.0 % Final  . Hemoglobin 06/09/2016 11.6* 13.0 - 17.0 g/dL Final  . Glucose-Capillary 06/09/2016 124* 65 - 99 mg/dL Final  . WBC 06/09/2016 7.3  4.0 - 10.5 K/uL Final  . RBC 06/09/2016 3.43* 4.22 - 5.81 MIL/uL Final  . Hemoglobin 06/09/2016 10.1* 13.0 - 17.0 g/dL Final  . HCT 06/09/2016 31.3* 39.0 - 52.0 % Final  . MCV 06/09/2016 91.3  78.0 - 100.0 fL Final  . MCH 06/09/2016 29.4  26.0 - 34.0 pg Final  . MCHC 06/09/2016 32.3  30.0 - 36.0 g/dL Final  . RDW 06/09/2016 14.7  11.5 - 15.5 % Final  . Platelets 06/09/2016 216  150 - 400 K/uL Final  . Creatinine, Ser 06/09/2016 1.00  0.61 - 1.24 mg/dL Final  . GFR calc non Af Amer 06/09/2016 >60  >60 mL/min Final  . GFR calc Af Amer 06/09/2016 >60  >60 mL/min Final   Comment: (NOTE) The eGFR has been calculated using the CKD EPI equation. This calculation has not been validated in all clinical situations. eGFR's persistently <60 mL/min signify possible Chronic Kidney Disease.   . Glucose-Capillary 06/09/2016 123* 65 - 99 mg/dL Final  . Glucose-Capillary 06/09/2016 158* 65 - 99 mg/dL Final  . Glucose-Capillary 06/10/2016 183* 65 - 99 mg/dL Final  . Glucose-Capillary 06/10/2016 199* 65 - 99 mg/dL Final  Office Visit on 05/30/2016  Component Date Value Ref Range Status  . POC Glucose 05/30/2016 52* 70 - 99 mg/dl Final  . Hemoglobin A1C 05/30/2016 8.4   Final  . POC Glucose 05/30/2016 116* 70 - 99 mg/dl Final  Office Visit on 04/26/2016  Component Date Value Ref Range Status  . POC Glucose 04/26/2016 240* 70 - 99 mg/dl Final  Admission on 04/10/2016, Discharged on  04/10/2016  Component Date Value Ref Range Status  . Sodium 04/10/2016 141  135 - 145 mmol/L Final  . Potassium 04/10/2016 4.6  3.5 - 5.1 mmol/L Final  . Chloride 04/10/2016 105  101 - 111 mmol/L Final  . CO2 04/10/2016 29  22 - 32  mmol/L Final  . Glucose, Bld 04/10/2016 194* 65 - 99 mg/dL Final  . BUN 04/10/2016 17  6 - 20 mg/dL Final  . Creatinine, Ser 04/10/2016 1.07  0.61 - 1.24 mg/dL Final  . Calcium 04/10/2016 8.8* 8.9 - 10.3 mg/dL Final  . Total Protein 04/10/2016 6.7  6.5 - 8.1 g/dL Final  . Albumin 04/10/2016 3.2* 3.5 - 5.0 g/dL Final  . AST 04/10/2016 16  15 - 41 U/L Final  . ALT 04/10/2016 16* 17 - 63 U/L Final  . Alkaline Phosphatase 04/10/2016 54  38 - 126 U/L Final  . Total Bilirubin 04/10/2016 0.4  0.3 - 1.2 mg/dL Final  . GFR calc non Af Amer 04/10/2016 >60  >60 mL/min Final  . GFR calc Af Amer 04/10/2016 >60  >60 mL/min Final   Comment: (NOTE) The eGFR has been calculated using the CKD EPI equation. This calculation has not been validated in all clinical situations. eGFR's persistently <60 mL/min signify possible Chronic Kidney Disease.   . Anion gap 04/10/2016 7  5 - 15 Final  . WBC 04/10/2016 6.5  4.0 - 10.5 K/uL Final  . RBC 04/10/2016 3.50* 4.22 - 5.81 MIL/uL Final  . Hemoglobin 04/10/2016 10.2* 13.0 - 17.0 g/dL Final  . HCT 04/10/2016 32.2* 39.0 - 52.0 % Final  . MCV 04/10/2016 92.0  78.0 - 100.0 fL Final  . MCH 04/10/2016 29.1  26.0 - 34.0 pg Final  . MCHC 04/10/2016 31.7  30.0 - 36.0 g/dL Final  . RDW 04/10/2016 14.1  11.5 - 15.5 % Final  . Platelets 04/10/2016 217  150 - 400 K/uL Final  . Glucose-Capillary 04/10/2016 176* 65 - 99 mg/dL Final  . Comment 1 04/10/2016 Notify RN   Final  . Comment 2 04/10/2016 Document in Chart   Final  . Glucose-Capillary 04/10/2016 170* 65 - 99 mg/dL Final  . Comment 1 04/10/2016 Notify RN   Final  . Comment 2 04/10/2016 Document in Chart   Final    Dg Chest 2 View  Result Date: 07/02/2016 CLINICAL DATA:  Shortness  of breath, bibasilar crackle, history of foot amputation 1 week ago EXAM: CHEST  2 VIEW COMPARISON:  Chest x-ray of 06/27/2016 FINDINGS: There is mild linear atelectasis at the left lung base. Also there do appear to be small pleural effusions present. There is a small amount of fluid in the fissures on the lateral view and very mild congestive heart failure cannot be excluded. The heart is mildly enlarged. No bony abnormality is seen. IMPRESSION: 1. Small bilateral pleural effusions and left basilar linear atelectasis. 2. Cardiomegaly.  Cannot exclude mild pulmonary vascular congestion. Electronically Signed   By: Ivar Drape M.D.   On: 07/02/2016 15:00   Dg Chest 2 View  Result Date: 06/27/2016 CLINICAL DATA:  Chest tightness. EXAM: CHEST  2 VIEW COMPARISON:  CT 06/13/2016. FINDINGS: Mediastinum and hilar structures are normal. Low lung volumes with mild bibasilar subsegmental atelectasis. Mild left base infiltrate cannot be excluded. No pleural effusion or pneumothorax. Heart size normal. IMPRESSION: Low lung volumes with mild bibasilar subsegmental atelectasis . Mild left base infiltrate cannot be excluded . Electronically Signed   By: Marcello Moores  Register   On: 06/27/2016 09:26   Dg Chest 2 View  Result Date: 06/13/2016 CLINICAL DATA:  Intermittent chest pain. EXAM: CHEST  2 VIEW COMPARISON:  02/29/2016 FINDINGS: Normal heart size and mediastinal contours. No acute infiltrate or edema. No effusion or pneumothorax. No acute osseous findings. IMPRESSION: Negative chest. Electronically  Signed   By: Monte Fantasia M.D.   On: 06/13/2016 16:25   Ct Angio Chest Pe W Or Wo Contrast  Result Date: 06/27/2016 CLINICAL DATA:  Hypoxia, tachycardia. Upper chest pain. Symptoms x 1 week. EXAM: CT ANGIOGRAPHY CHEST WITH CONTRAST TECHNIQUE: Multidetector CT imaging of the chest was performed using the standard protocol during bolus administration of intravenous contrast. Multiplanar CT image reconstructions and MIPs  were obtained to evaluate the vascular anatomy. CONTRAST:  100 mL Isovue 370 IV COMPARISON:  06/13/2016 FINDINGS: Cardiovascular: Right arm IV contrast administration. SVC patent. Right atrium nondilated. Right ventricle nondilated. Satisfactory opacification of pulmonary arteries noted, and there is no evidence of pulmonary emboli. Patent bilateral pulmonary veins drain into the left atrium. Scattered coronary calcifications. Adequate contrast opacification of the thoracic aorta with no evidence of dissection, aneurysm, or stenosis. There is bovine variant brachiocephalic arch anatomy without proximal stenosis. Mediastinum/Nodes: No enlarged mediastinal, hilar, or axillary lymph nodes. Thyroid gland, trachea, and esophagus demonstrate no significant findings. Lungs/Pleura: Relatively low volumes. Patchy airspace consolidation/ atelectasis in both lower lobes, new since prior study. Some linear scarring/ atelectasis in the lingula inferiorly, new since previous. No pleural effusion. No pneumothorax. Upper Abdomen: No acute abnormality. Musculoskeletal: No chest wall abnormality. No acute or significant osseous findings. Obesity. Review of the MIP images confirms the above findings. IMPRESSION: 1. Negative for acute PE or thoracic aortic dissection. 2. Patchy areas of bilateral lower lobe atelectasis/consolidation, new since previous exam. Electronically Signed   By: Lucrezia Europe M.D.   On: 06/27/2016 16:18   Ct Angio Chest Pe W/cm &/or Wo Cm  Result Date: 06/13/2016 CLINICAL DATA:  46 year old male with intermittent chest pain and shortness of breath. Elevated D-dimer. Recent toe amputation surgery. EXAM: CT ANGIOGRAPHY CHEST WITH CONTRAST TECHNIQUE: Multidetector CT imaging of the chest was performed using the standard protocol during bolus administration of intravenous contrast. Multiplanar CT image reconstructions and MIPs were obtained to evaluate the vascular anatomy. CONTRAST:  100 cc Isovue 370 IV.  COMPARISON:  Chest radiograph from earlier today. 01/19/2016 chest CT angiogram. FINDINGS: Cardiovascular: The study is high quality for the evaluation of pulmonary embolism. There are no filling defects in the central, lobar, segmental or subsegmental pulmonary artery branches to suggest acute pulmonary embolism. Great vessels are normal in course and caliber. Normal heart size. No significant pericardial fluid/thickening. Left anterior descending, left circumflex and right coronary atherosclerosis . Mediastinum/Nodes: No discrete thyroid nodules. Small fluid level in the mid thoracic esophagus. No pathologically enlarged axillary, mediastinal or hilar lymph nodes. Lungs/Pleura: No pneumothorax. No pleural effusion. Mild hypoventilatory changes in the dependent lungs. No acute consolidative airspace disease, lung masses or significant pulmonary nodules. Upper abdomen: Unremarkable. Musculoskeletal:  No aggressive appearing focal osseous lesions. Review of the MIP images confirms the above findings. IMPRESSION: 1. No pulmonary embolism.  No active pulmonary disease. 2. Three-vessel coronary atherosclerosis. 3. Small fluid level in the mid thoracic esophagus, which may indicate esophageal dysmotility and/or gastroesophageal reflux disease. Electronically Signed   By: Ilona Sorrel M.D.   On: 06/13/2016 18:36   Mr Foot Left W Wo Contrast  Result Date: 06/24/2016 CLINICAL DATA:  History of prior amputations. The most recent was the third toe. Pain, swelling and erythema. EXAM: MRI OF LOWER LEFT EXTREMITY WITHOUT AND WITH CONTRAST; MRI OF THE LEFT ANKLE WITHOUT AND WITH CONTRAST; MRI OF THE LEFT FOREFOOT WITHOUT AND WITH CONTRAST TECHNIQUE: Multiplanar, multisequence MR imaging of the left lower extremity was performed both before and after  administration of intravenous contrast. CONTRAST:  20 cc MultiHance COMPARISON:  Radiographs 06/23/2016 FINDINGS: Extensive subcutaneous soft tissue swelling/ edema/ fluid  extending from the left knee all way down to the forefoot. No significant findings for myositis involving the calf. There is a 4 cm hematoma noted along the medial aspect of the calf adjacent to the medial gastrocs and soleus muscles. No findings to suggest septic arthritis at the knee or ankle joints. No findings for osteomyelitis involving the tibia or fibula. There is a 13 x 6 mm rim enhancing fluid collection in the region of the recent third toe amputation most consistent with a postoperative abscess. There is also a small fluid collection along the second metatarsal head with some enhancement. Could not exclude a small/ developing abscess. Signal abnormality and enhancement in the second and third metatarsal heads is suspicious for osteomyelitis. Fairly extensive cellulitis and myofasciitis involving the forefoot and midfoot regions. The mid and hindfoot bony structures are intact. Incidental note is made of a chronically torn flexor hallucis longus tendon. IMPRESSION: Severe diffuse cellulitis involving the entire imaged left lower extremity. Two small abscesses distal to the second and third metatarsal heads. Suspect osteomyelitis involving the second and third metatarsal heads. Cellulitis and diffuse myofasciitis involving the forefoot and midfoot regions. Electronically Signed   By: Marijo Sanes M.D.   On: 06/24/2016 15:29   Mr Tibia Fibula Left W Wo Contrast  Result Date: 06/24/2016 CLINICAL DATA:  History of prior amputations. The most recent was the third toe. Pain, swelling and erythema. EXAM: MRI OF LOWER LEFT EXTREMITY WITHOUT AND WITH CONTRAST; MRI OF THE LEFT ANKLE WITHOUT AND WITH CONTRAST; MRI OF THE LEFT FOREFOOT WITHOUT AND WITH CONTRAST TECHNIQUE: Multiplanar, multisequence MR imaging of the left lower extremity was performed both before and after administration of intravenous contrast. CONTRAST:  20 cc MultiHance COMPARISON:  Radiographs 06/23/2016 FINDINGS: Extensive subcutaneous soft  tissue swelling/ edema/ fluid extending from the left knee all way down to the forefoot. No significant findings for myositis involving the calf. There is a 4 cm hematoma noted along the medial aspect of the calf adjacent to the medial gastrocs and soleus muscles. No findings to suggest septic arthritis at the knee or ankle joints. No findings for osteomyelitis involving the tibia or fibula. There is a 13 x 6 mm rim enhancing fluid collection in the region of the recent third toe amputation most consistent with a postoperative abscess. There is also a small fluid collection along the second metatarsal head with some enhancement. Could not exclude a small/ developing abscess. Signal abnormality and enhancement in the second and third metatarsal heads is suspicious for osteomyelitis. Fairly extensive cellulitis and myofasciitis involving the forefoot and midfoot regions. The mid and hindfoot bony structures are intact. Incidental note is made of a chronically torn flexor hallucis longus tendon. IMPRESSION: Severe diffuse cellulitis involving the entire imaged left lower extremity. Two small abscesses distal to the second and third metatarsal heads. Suspect osteomyelitis involving the second and third metatarsal heads. Cellulitis and diffuse myofasciitis involving the forefoot and midfoot regions. Electronically Signed   By: Marijo Sanes M.D.   On: 06/24/2016 15:29   Mr Ankle Left W Wo Contrast  Result Date: 06/24/2016 CLINICAL DATA:  History of prior amputations. The most recent was the third toe. Pain, swelling and erythema. EXAM: MRI OF LOWER LEFT EXTREMITY WITHOUT AND WITH CONTRAST; MRI OF THE LEFT ANKLE WITHOUT AND WITH CONTRAST; MRI OF THE LEFT FOREFOOT WITHOUT AND WITH CONTRAST TECHNIQUE:  Multiplanar, multisequence MR imaging of the left lower extremity was performed both before and after administration of intravenous contrast. CONTRAST:  20 cc MultiHance COMPARISON:  Radiographs 06/23/2016 FINDINGS:  Extensive subcutaneous soft tissue swelling/ edema/ fluid extending from the left knee all way down to the forefoot. No significant findings for myositis involving the calf. There is a 4 cm hematoma noted along the medial aspect of the calf adjacent to the medial gastrocs and soleus muscles. No findings to suggest septic arthritis at the knee or ankle joints. No findings for osteomyelitis involving the tibia or fibula. There is a 13 x 6 mm rim enhancing fluid collection in the region of the recent third toe amputation most consistent with a postoperative abscess. There is also a small fluid collection along the second metatarsal head with some enhancement. Could not exclude a small/ developing abscess. Signal abnormality and enhancement in the second and third metatarsal heads is suspicious for osteomyelitis. Fairly extensive cellulitis and myofasciitis involving the forefoot and midfoot regions. The mid and hindfoot bony structures are intact. Incidental note is made of a chronically torn flexor hallucis longus tendon. IMPRESSION: Severe diffuse cellulitis involving the entire imaged left lower extremity. Two small abscesses distal to the second and third metatarsal heads. Suspect osteomyelitis involving the second and third metatarsal heads. Cellulitis and diffuse myofasciitis involving the forefoot and midfoot regions. Electronically Signed   By: Marijo Sanes M.D.   On: 06/24/2016 15:29   Dg Foot Complete Left  Result Date: 06/23/2016 CLINICAL DATA:  Redness and swelling beginning today extending from the left ankle to the midshaft left tibia and fibula. Previous amputation of left toes. History of osteomyelitis. EXAM: LEFT FOOT - COMPLETE 3+ VIEW COMPARISON:  MRI left foot 06/09/2016.  Left foot 06/08/2016. FINDINGS: Postoperative changes with amputations of the first, second, and third toes at the level of the metatarsal phalangeal joint. Amputation of the third toe is new since previous study. Periosteal  reaction along the shaft of the second metatarsal is unchanged since prior study and probably represents chronic process. No evidence of acute cortical erosion or sclerosis to suggest residual or recurrent osteomyelitis. No focal bone lesion or bone destruction. Degenerative changes in the intertarsal joints. Prominent vascular calcifications. IMPRESSION: Amputations of the left first, second, and third toes. No acute bone erosions to suggest residual or recurrent osteomyelitis. Electronically Signed   By: Lucienne Capers M.D.   On: 06/23/2016 23:35   Dg Foot Complete Right  Result Date: 06/18/2016 CLINICAL DATA:  Diabetes and right foot wound. EXAM: RIGHT FOOT COMPLETE - 3+ VIEW COMPARISON:  02/20/2016 FINDINGS: Mild dorsal forefoot soft tissue swelling. Patient's ulcer is not clearly visualized. No evidence of osteomyelitis or opaque foreign body. Negative for fracture. Arterial calcification correlating with diabetes history. IMPRESSION: No evidence of osteomyelitis or foreign body. Electronically Signed   By: Monte Fantasia M.D.   On: 06/18/2016 12:50     Assessment/Plan   ICD-9-CM ICD-10-CM   1. Symptomatic anemia 285.9 D64.9    Hgb 5.9; s/p IV feraheme x 1  2. S/P transmetatarsal amputation of foot, left (HCC) V49.73 Z89.432   3. Uncontrolled type 2 diabetes mellitus with complication, with long-term current use of insulin (HCC) 250.82 E11.8    V58.67 E11.65     Z79.4    with gastroparesis, neuropathy and CKD  4. Essential hypertension 401.9 I10   5. Chronic diastolic CHF (congestive heart failure), NYHA class 1 (HCC) 428.32 I50.32   6. Gastroesophageal reflux disease without esophagitis  530.81 K21.9   7. Depression with anxiety 300.4 F41.8   8. Dyslipidemia associated with type 2 diabetes mellitus (HCC) 250.80 E11.69    272.4 E78.5   9. CKD (chronic kidney disease) stage 3, GFR 30-59 ml/min 585.3 N18.3     Check cbc, bmp  Cont current meds as ordered. Pain controlled with current  pain regimen  F/u with Ortho as scheduled  Wound care as ordered  PT/OT/ST as ordered  f/u with other specialists as indicated  NO PRBC transfusion due to he is a [employer] Witness  GOAL: short term rehab and d/c home when medically appropriate. Communicated with pt and nursing.  Will follow  Sammy Douthitt S. Perlie Gold  [facility] [address] [place], [place] [address] [phone] Cell (Monday-Friday 8 AM - 5 PM) [phone] After 5 PM and follow prompts

## 2016-07-22 ENCOUNTER — Ambulatory Visit (INDEPENDENT_AMBULATORY_CARE_PROVIDER_SITE_OTHER): Payer: Medicaid Other | Admitting: Orthopedic Surgery

## 2016-07-22 ENCOUNTER — Encounter: Payer: Self-pay | Admitting: Adult Health

## 2016-07-22 ENCOUNTER — Non-Acute Institutional Stay (SKILLED_NURSING_FACILITY): Payer: Medicaid Other | Admitting: Adult Health

## 2016-07-22 ENCOUNTER — Telehealth: Payer: Self-pay | Admitting: Family Medicine

## 2016-07-22 ENCOUNTER — Encounter (INDEPENDENT_AMBULATORY_CARE_PROVIDER_SITE_OTHER): Payer: Self-pay | Admitting: Orthopedic Surgery

## 2016-07-22 VITALS — Ht 67.0 in | Wt 174.0 lb

## 2016-07-22 DIAGNOSIS — E1165 Type 2 diabetes mellitus with hyperglycemia: Secondary | ICD-10-CM | POA: Diagnosis not present

## 2016-07-22 DIAGNOSIS — E118 Type 2 diabetes mellitus with unspecified complications: Secondary | ICD-10-CM | POA: Diagnosis not present

## 2016-07-22 DIAGNOSIS — IMO0002 Reserved for concepts with insufficient information to code with codable children: Secondary | ICD-10-CM

## 2016-07-22 DIAGNOSIS — I5032 Chronic diastolic (congestive) heart failure: Secondary | ICD-10-CM | POA: Diagnosis not present

## 2016-07-22 DIAGNOSIS — Z794 Long term (current) use of insulin: Secondary | ICD-10-CM

## 2016-07-22 DIAGNOSIS — Z89432 Acquired absence of left foot: Secondary | ICD-10-CM | POA: Diagnosis not present

## 2016-07-22 DIAGNOSIS — E1143 Type 2 diabetes mellitus with diabetic autonomic (poly)neuropathy: Secondary | ICD-10-CM | POA: Diagnosis not present

## 2016-07-22 DIAGNOSIS — K3184 Gastroparesis: Secondary | ICD-10-CM | POA: Diagnosis not present

## 2016-07-22 DIAGNOSIS — E119 Type 2 diabetes mellitus without complications: Secondary | ICD-10-CM | POA: Insufficient documentation

## 2016-07-22 DIAGNOSIS — M869 Osteomyelitis, unspecified: Secondary | ICD-10-CM | POA: Diagnosis not present

## 2016-07-22 MED ORDER — ACCU-CHEK AVIVA PLUS W/DEVICE KIT: PACK | 0 refills | Status: DC

## 2016-07-22 MED ORDER — GLUCOSE BLOOD VI STRP: ORAL_STRIP | 12 refills | Status: DC

## 2016-07-22 MED ORDER — ACCU-CHEK SOFTCLIX LANCETS MISC: 12 refills | Status: DC

## 2016-07-22 MED ORDER — ACCU-CHEK SOFTCLIX LANCET DEV MISC: 0 refills | Status: DC

## 2016-07-22 NOTE — Telephone Encounter (Signed)
Pt called requesting change of glucose meter due to insurance coverage. Pt would need new test strips. Please f/up

## 2016-07-22 NOTE — Progress Notes (Signed)
 Location:   [facility]    Place of Service:  SNF (31)    CODE STATUS: full code   Allergies  Allergen Reactions  . Lactose Intolerance (Gi) Diarrhea and Other (See Comments)    bloating  . Other Other (See Comments)    Red meat causes stomach pains, bloating and diarrhea  . Milk-Related Compounds Diarrhea and Other (See Comments)    Any dairy products  - diarrhea and bloating    Chief Complaint  Patient presents with  . Discharge Note    HPI:  He is being discharged to home with home health for rn for medication and wound management. He will need his prescriptions to be written and will need to follow up with his medical provider. He will need a rollator; tub bench and reacher. He had been hospitalized for a left metatarsal amputation   Past Medical History:  Diagnosis Date  . Anemia   . Bipolar disorder (HCC)   . Cellulitis and abscess of left leg 06/2016  . Chest pain    a. 2015 Reportedly normal stress test in [place].  . Chronic diastolic CHF (congestive heart failure) (HCC)    a.03/2015 Echo: EF 55-60%, Gr 1 DD, mild MR, triv PR.  . Depression   . Depression with anxiety   . Dyspnea    with exertion  . GERD (gastroesophageal reflux disease)   . Hyperlipidemia   . Hypertension    a. 08/2014 Admitted with hypertensive urgency.  . Insomnia   . Internal carotid artery stenosis   . Lower GI bleed 07/04/2015  . Neuropathy (HCC)   . Refusal of blood transfusions as patient is Jehovah's Witness   . TIA (transient ischemic attack) 08/2014; 03/2015   a. 08/2014 in setting of hypertensive urgency.  . Type II diabetes mellitus (HCC)    started insulin spring 2016, Type II  . Vitamin D deficiency spring 2016    Past Surgical History:  Procedure Laterality Date  . AMPUTATION Left 08/03/2015   Procedure: AMPUTATION LEFT GREAT TOE;  Surgeon: Marcus Duda V, MD;  [facility];  Service: Orthopedics;  Laterality: Left;  . AMPUTATION Left 12/02/2015   Procedure:  amputation of left 2nd digit  . AMPUTATION Left 04/10/2016   Procedure: Left 2nd Toe Amputation at MTP Joint;  Surgeon: Marcus V Duda, MD;  [facility];  Service: Orthopedics;  Laterality: Left;  . AMPUTATION Left 06/09/2016   Procedure: AMPUTATION LEFT THIRD TOE;  Surgeon: Mark C Yates, MD;  [facility];  Service: Orthopedics;  Laterality: Left;  . AMPUTATION Left 06/26/2016   Procedure: Left Foot Transmetatarsal Amputation;  Surgeon: Marcus V Duda, MD;  [facility];  Service: Orthopedics;  Laterality: Left;  . APPLICATION OF WOUND VAC Left 12/19/2015   Procedure: APPLICATION OF WOUND VAC;  Surgeon: Scott Gregory Dean, MD;  [facility];  Service: Orthopedics;  Laterality: Left;  . CIRCUMCISION    . COLONOSCOPY N/A 01/22/2016   Procedure: COLONOSCOPY;  Surgeon: Carl E Gessner, MD;  [facility];  Service: Endoscopy;  Laterality: N/A;  . ESOPHAGOGASTRODUODENOSCOPY N/A 01/19/2016   Procedure: ESOPHAGOGASTRODUODENOSCOPY (EGD);  Surgeon: Steven Paul Armbruster, MD;  [facility];  Service: Gastroenterology;  Laterality: N/A;  . ESOPHAGOGASTRODUODENOSCOPY N/A 01/22/2016   Procedure: ESOPHAGOGASTRODUODENOSCOPY (EGD);  Surgeon: Carl E Gessner, MD;  [facility];  Service: Endoscopy;  Laterality: N/A;  . I&D EXTREMITY Left 12/02/2015   Procedure: IRRIGATION AND DEBRIDEMENT OF FOOT; LEFT SECOND TOE AMPUTATION;    Surgeon: Scott Gregory Dean, MD;  [facility];  Service: Orthopedics;  Laterality: Left;  . I&D EXTREMITY Left 12/19/2015   Procedure: I & D LEFT FOOT WITH BEADS ;  Surgeon: Scott Gregory Dean, MD;  [facility];  Service: Orthopedics;  Laterality: Left;  . I&D EXTREMITY Right 01/17/2016   Procedure: IRRIGATION AND DEBRIDEMENT RIGHT FOOT;  Surgeon: Marcus V Duda, MD;  [facility];  Service: Orthopedics;  Laterality: Right;  . INCISION AND DRAINAGE FOOT Right 01/17/2016  . INGUINAL HERNIA REPAIR Bilateral ~ 1983- ~ 1986  . TONSILLECTOMY  ~ 1985     Social History   Social History  . Marital status: Married    Spouse name: N/A  . Number of children: N/A  . Years of education: N/A   Occupational History  . Not on file.   Social History Main Topics  . Smoking status: Never Smoker  . Smokeless tobacco: Never Used  . Alcohol use No  . Drug use: No  . Sexual activity: No   Other Topics Concern  . Not on file   Social History Narrative   Lives in [place] with wife.  Active but doesn't routinely exercise.   Family History  Problem Relation Age of Onset  . Hypertension Mother   . Diabetes Mother   . Hyperlipidemia Mother   . Heart disease Mother     s/p pacemaker  . Diabetes Father   . Hypertension Father   . Stroke Father   . Heart attack Father     first MI @ 49.  . Stroke Brother     VITAL SIGNS BP 140/85   Pulse 82   Temp 98.5 F (36.9 C)   Resp 18   Ht 5' 7" (1.702 m)   Wt 174 lb 3.2 oz (79 kg)   SpO2 93%   BMI 27.28 kg/m   Patient's Medications  New Prescriptions   No medications on file  Previous Medications   ACCU-CHEK SOFTCLIX LANCETS LANCETS    Use as instructed   AMLODIPINE (NORVASC) 10 MG TABLET    Take 1 tablet (10 mg total) by mouth daily.   ASPIRIN EC 81 MG TABLET    Take 1 tablet (81 mg total) by mouth daily.   ATORVASTATIN (LIPITOR) 80 MG TABLET    Take 1 tablet (80 mg total) by mouth every morning.   BLOOD GLUCOSE MONITORING SUPPL (ACCU-CHEK AVIVA PLUS) W/DEVICE KIT    Use as directed   CARVEDILOL (COREG) 25 MG TABLET    Take 1 tablet (25 mg total) by mouth 2 (two) times daily with a meal.   DIVALPROEX (DEPAKOTE) 500 MG DR TABLET    Take 2 tablets (1,000 mg total) by mouth at bedtime.   FLUOXETINE (PROZAC) 20 MG TABLET    Take 1 tablet (20 mg total) by mouth every morning.   FOLIC ACID (FOLVITE) 1 MG TABLET    Take 1 tablet (1 mg total) by mouth daily.   FUROSEMIDE (LASIX) 40 MG TABLET    Take 1 tablet (40 mg total) by mouth daily as needed for fluid or edema (or weight gain 3 lbs in 1  day or 5 lbs in 1 week).   GABAPENTIN (NEURONTIN) 100 MG CAPSULE    Take 2 capsules (200 mg total) by mouth 3 (three) times daily.   GLUCOSE BLOOD (ACCU-CHEK AVIVA PLUS) TEST STRIP    Use as instructed   IBUPROFEN (ADVIL,MOTRIN) 800 MG TABLET    TAKE 1 TABLET   BY MOUTH EVERY 8 HOURS AS NEEDED.   INSULIN GLARGINE (LANTUS SOLOSTAR) 100 UNIT/ML SOLOSTAR PEN    Inject 18 Units into the skin every morning.   LANCET DEVICES (ACCU-CHEK SOFTCLIX) LANCETS    Use as instructed   METFORMIN (GLUCOPHAGE) 1000 MG TABLET    Take 1 tablet (1,000 mg total) by mouth 2 (two) times daily with a meal.   METOCLOPRAMIDE (REGLAN) 5 MG TABLET    Take 1 tablet (5 mg total) by mouth 3 (three) times daily before meals.   ONDANSETRON (ZOFRAN ODT) 8 MG DISINTEGRATING TABLET    Take 1 tablet (8 mg total) by mouth every 8 (eight) hours as needed for nausea or vomiting.   OXYCODONE-ACETAMINOPHEN (PERCOCET/ROXICET) 5-325 MG TABLET    Take 1-2 tablets by mouth every 8 (eight) hours as needed for moderate pain or severe pain. Do not exceed 3gm of APAP from all sources/24hrs   PANTOPRAZOLE (PROTONIX) 40 MG TABLET    Take 40 mg by mouth daily.    POLYETHYLENE GLYCOL (MIRALAX / GLYCOLAX) PACKET    Take 17 g by mouth daily as needed for moderate constipation.   RANITIDINE (ZANTAC) 300 MG TABLET    Take 1 tablet (300 mg total) by mouth at bedtime.   SENNA-DOCUSATE (SENOKOT-S) 8.6-50 MG TABLET    Take 1 tablet by mouth at bedtime.   TRAZODONE (DESYREL) 100 MG TABLET    Take 1 tablet (100 mg total) by mouth at bedtime.   TRIAMCINOLONE CREAM (KENALOG) 0.5 %    Apply to affected area topically every day and evening shift for affected area.   VITAMIN B-12 1000 MCG TABLET    Take 1 tablet (1,000 mcg total) by mouth daily.   VITAMIN D, ERGOCALCIFEROL, (DRISDOL) 50000 UNITS CAPS CAPSULE    Take 1 capsule (50,000 Units total) by mouth every 30 (thirty) days. Every 30 days  Modified Medications   No medications on file  Discontinued Medications    No medications on file     SIGNIFICANT DIAGNOSTIC EXAMS   06-24-16: left lower extremity foot and ankle MRI: Severe diffuse cellulitis involving the entire imaged left lower extremity. Two small abscesses distal to the second and third metatarsal heads. Suspect osteomyelitis involving the second and third metatarsal heads.  Cellulitis and diffuse myofasciitis involving the forefoot and midfoot regions.  06-27-16: ct angio of chest: 1. Negative for acute PE or thoracic aortic dissection. 2. Patchy areas of bilateral lower lobe atelectasis/consolidation, new since previous exam.   07-02-16: chest x-ray: 1. Small bilateral pleural effusions and left basilar linear atelectasis. 2. Cardiomegaly.  Cannot exclude mild pulmonary vascular congestion.   LABS REVIEWED;   06-23-16: wbc 8.8 hgb 9.7; hct 29.7; mcv 93.1; plt 230; glucose 227 bun 28; creat 1.42; k+ 4.5; na++ 139; liver normal albumin 3.5 06-27-16: wbc 10.0; hgb 6.5; hct 19.5; mcv 92.9; plt 166; glucose 222; bun 26; creat 1.47; k+ 4.9; na++ 136 06-28-16: iron 11; tibc 221; ferritin 436 07-03-16: wbc 9.8; hgb 5.9; hct 18.8; mcv 95.9; plt 397   Review of Systems  Constitutional: Negative for malaise/fatigue.  Respiratory: Negative for cough and shortness of breath.   Cardiovascular: Negative for chest pain, palpitations and leg swelling.  Gastrointestinal: Negative for abdominal pain, constipation and heartburn.  Musculoskeletal: Negative for back pain, joint pain and myalgias.  Skin: Negative.   Neurological: Negative for dizziness.  Psychiatric/Behavioral: The patient is not nervous/anxious.       Physical Exam  Constitutional: No distress.  Eyes: Conjunctivae   are normal.  Neck: Neck supple. No JVD present. No thyromegaly present.  Cardiovascular: Normal rate, regular rhythm and intact distal pulses.   Respiratory: Effort normal and breath sounds normal. No respiratory distress. He has no wheezes.  GI: Soft. Bowel sounds are  normal. He exhibits no distension. There is no tenderness.  Musculoskeletal: He exhibits no edema.  Able to move all extremities  Status post left trans metatarsal amputation  Lymphadenopathy:    He has no cervical adenopathy.  Neurological: He is alert.  Skin: Skin is warm and dry. He is not diaphoretic.  Incision line without signs of infection   Psychiatric: He has a normal mood and affect.         ASSESSMENT/ PLAN:   Patient is being discharged with the following home health services:  rn to evaluate and treat as indicated for medication management and wound management   Patient is being discharged with the following durable medical equipment:  rollator in order to allow patient to maintain current level of independence with his adls. He will need a tub bench and reacher   Patient has been advised to f/u with their PCP in 1-2 weeks to bring them up to date on their rehab stay.  Social services at facility was responsible for arranging this appointment.  Pt was provided with a 30 day supply of prescriptions for medications and refills must be obtained from their PCP.  For controlled substances, a more limited supply may be provided adequate until PCP appointment only. #20 percocet 5/325 mg tabs   Time spent with patient  40  minutes >50% time spent counseling; reviewing medical record; tests; labs; and developing future plan of care    Deborah Green NP [facility]  Contact [phone] Monday through Friday 8am- 5pm  After hours call [phone]   

## 2016-07-22 NOTE — Telephone Encounter (Signed)
Patient appears to have medicaid so will fill accu-chek meter and supplies

## 2016-07-22 NOTE — Progress Notes (Signed)
Office Visit Note   Patient: Shane [name]           Date of Birth: 21-Feb-1971           MRN: [id number] Visit Date: 07/22/2016              Requested by: Boykin Nearing, MD [address] [place], [place] [address] PCP: Minerva Ends, MD  Chief Complaint  Patient presents with  . Left Foot - Routine Post Op    Left foot transmet amputation     HPI: Patient is s/p left transmetatarsal amputation. Non weight bearing in a wheelchair. The pt has applied a dry dressing to the area daily and wears a post op shoe. He resides at [facility] and rehab. The incision is well healed and pt has no concerns at this time. Pamella Pert, RMA    Assessment & Plan: Visit Diagnoses:  1. S/P transmetatarsal amputation of foot, left ([place])     Plan: Follow-up in 4 weeks. Patient does have a prescription for extra-depth shoes and orthotics and spacer from [facility] he will follow up then to obtain issues until then he will use his Darco shoe he was given instructions for physical therapy progressive ambulation weightbearing as tolerated on the left moisturizing lotion on the left foot as well as heel cord stretching.  Follow-Up Instructions: Return in about 4 weeks (around 08/19/2016).   Ortho Exam Examination the transmetatarsal amputation is well-healed there is no cellulitis no open wound or drainage no signs of infection. Patient does have a little bit of heel cord tightness to neutral and is given instructions and demonstrated heel cord stretching. He will continue with his Darco shoe  Imaging: No results found.  Orders:  No orders of the defined types were placed in this encounter.  No orders of the defined types were placed in this encounter.    Procedures: No procedures performed  Clinical Data: No additional findings.  Subjective: Review of Systems  Objective: Vital Signs: Ht 5\' 7"  (1.702 m)   Wt 174 lb (78.9 kg)   BMI 27.25 kg/m   Specialty Comments:  No  specialty comments available.  PMFS History: Patient Active Problem List   Diagnosis Date Noted  . Dyslipidemia associated with type 2 diabetes mellitus ([place]) 07/22/2016  . Constipation 07/02/2016  . Osteomyelitis of left foot ([place]) 07/02/2016  . S/P transmetatarsal amputation of foot, left ([place]) 07/02/2016  . Dyspnea on exertion 07/02/2016  . Cellulitis of left lower leg 06/24/2016  . Cellulitis of left lower extremity   . Subacute osteomyelitis, left ankle and foot ([place]) 06/08/2016  . Diabetic gastroparesis ([place]) 05/30/2016  . Chronic ulcer of left foot ([place]) 05/22/2016  . Dermatitis 04/26/2016  . Buzzing in ear, right 03/26/2016  . Joint pain 03/11/2016  . Weakness 03/08/2016  . Dizziness 03/03/2016  . Diabetic ulcer of right foot associated with type 2 diabetes mellitus, with necrosis of muscle ([place]) 02/21/2016  . Chronic diastolic CHF (congestive heart failure), NYHA class 1 ([place]) 12/30/2015  . Diabetes mellitus type 2, uncontrolled, with complications ([place]) [id number]  . Depression with anxiety 12/01/2015  . Pain in the chest 12/01/2015  . Insomnia 11/21/2015  . GERD (gastroesophageal reflux disease) 08/18/2015  . Abscess of third toe, left 08/01/2015  . Erectile dysfunction 07/04/2015  . Symptomatic anemia 04/24/2015  . AKI (acute kidney injury) ([place]) 04/24/2015  . Left arm weakness 04/02/2015  . Sensory disturbance 04/02/2015  . Essential hypertension 04/02/2015  .  Hemispheric carotid artery syndrome   . HLD (hyperlipidemia)   . Headache    Past Medical History:  Diagnosis Date  . Anemia   . Bipolar disorder ([place])   . Cellulitis and abscess of left leg 06/2016  . Chest pain    a. 2015 Reportedly normal stress test in [place].  [place] Kitchen Chronic diastolic CHF (congestive heart failure) (HCC)    a.03/2015 Echo: EF 55-60%, Gr 1 DD, mild MR, triv PR.  . Depression   . Depression with anxiety   . Dyspnea    with exertion  . GERD (gastroesophageal reflux disease)   .  Hyperlipidemia   . Hypertension    a. 08/2014 Admitted with hypertensive urgency.  . Insomnia   . Internal carotid artery stenosis   . Lower GI bleed 07/04/2015  . Neuropathy ([place])   . Refusal of blood transfusions as patient is Jehovah's Witness   . TIA (transient ischemic attack) 08/2014; 03/2015   a. 08/2014 in setting of hypertensive urgency.  . Type II diabetes mellitus ([place])    started insulin spring 2016, Type II  . Vitamin D deficiency spring 2016    Family History  Problem Relation Age of Onset  . Hypertension Mother   . Diabetes Mother   . Hyperlipidemia Mother   . Heart disease Mother     s/p pacemaker  . Diabetes Father   . Hypertension Father   . Stroke Father   . Heart attack Father     first MI @ 41.  . Stroke Brother     Past Surgical History:  Procedure Laterality Date  . AMPUTATION Left 08/03/2015   Procedure: AMPUTATION LEFT GREAT TOE;  Surgeon: Newt Minion, MD;  Location: [place];  Service: Orthopedics;  Laterality: Left;  . AMPUTATION Left 12/02/2015   Procedure: amputation of left 2nd digit  . AMPUTATION Left 04/10/2016   Procedure: Left 2nd Toe Amputation at MTP Joint;  Surgeon: Newt Minion, MD;  [place]  Service: Orthopedics;  Laterality: Left;  . AMPUTATION Left 06/09/2016   Procedure: AMPUTATION LEFT THIRD TOE;  Surgeon: [name], MD;  Location: [place];  Service: Orthopedics;  Laterality: Left;  . AMPUTATION Left 06/26/2016   Procedure: Left Foot Transmetatarsal Amputation;  Surgeon: Newt Minion, MD;  Location: [place];  Service: Orthopedics;  Laterality: Left;  . APPLICATION OF WOUND VAC Left 12/19/2015   Procedure: APPLICATION OF WOUND VAC;  Surgeon: Meredith Pel, MD;  Location: [place];  Service: Orthopedics;  Laterality: Left;  . CIRCUMCISION    . COLONOSCOPY N/A 01/22/2016   Procedure: COLONOSCOPY;  Surgeon: Gatha Mayer, MD;  Location: [place];  Service: Endoscopy;  Laterality: N/A;  . ESOPHAGOGASTRODUODENOSCOPY N/A 01/19/2016     Procedure: ESOPHAGOGASTRODUODENOSCOPY (EGD);  Surgeon: Manus Gunning, MD;  Location: [place];  Service: Gastroenterology;  Laterality: N/A;  . ESOPHAGOGASTRODUODENOSCOPY N/A 01/22/2016   Procedure: ESOPHAGOGASTRODUODENOSCOPY (EGD);  Surgeon: Gatha Mayer, MD;  Location: [facility] ENDOSCOPY;  Service: Endoscopy;  Laterality: N/A;  . I&D EXTREMITY Left 12/02/2015   Procedure: IRRIGATION AND DEBRIDEMENT OF FOOT; LEFT SECOND TOE AMPUTATION;  Surgeon: Meredith Pel, MD;  Location: [place];  Service: Orthopedics;  Laterality: Left;  . I&D EXTREMITY Left 12/19/2015   Procedure: I & D LEFT FOOT WITH BEADS ;  Surgeon: Meredith Pel, MD;  Location: [facility];  Service: Orthopedics;  Laterality: Left;  . I&D EXTREMITY Right 01/17/2016   Procedure: IRRIGATION AND DEBRIDEMENT RIGHT FOOT;  Surgeon: Newt Minion, MD;  Location: [place];  Service: Orthopedics;  Laterality: Right;  . INCISION AND DRAINAGE FOOT Right 01/17/2016  . INGUINAL HERNIA REPAIR Bilateral ~ 1983- ~ 1986  . TONSILLECTOMY  ~ 35   Social History   Occupational History  . Not on file.   Social History Main Topics  . Smoking status: Never Smoker  . Smokeless tobacco: Never Used  . Alcohol use No  . Drug use: No  . Sexual activity: No

## 2016-07-24 ENCOUNTER — Telehealth (INDEPENDENT_AMBULATORY_CARE_PROVIDER_SITE_OTHER): Payer: Self-pay | Admitting: Orthopedic Surgery

## 2016-07-24 MED FILL — TRUE METRIX TEST STRIP: 25 days supply | Qty: 100 | Fill #3

## 2016-07-24 NOTE — Telephone Encounter (Signed)
Estill Bamberg (nurse)  from [facility] called needing verbal orders for plan of care approval.  Twice a wk for 2 wks and once a wk for 1 wk for- observation and assessment for wound and meds. Management. The number to contact her is [phone]

## 2016-07-25 NOTE — Telephone Encounter (Signed)
Called for verbal consent of POC spoke with Shane [name]

## 2016-07-29 MED FILL — AMLODIPINE BESYLATE 5 MG TA: 5 | 30 days supply | Qty: 30 | Fill #7

## 2016-07-29 MED FILL — LISINOPRIL 40 MG TABLET: 40 | 30 days supply | Qty: 30 | Fill #1

## 2016-07-29 MED FILL — GABAPENTIN 100 MG CAPSULE: 100 | 30 days supply | Qty: 90 | Fill #2

## 2016-07-29 MED FILL — PANTOPRAZOLE SOD DR 40 MG T: 40 | 30 days supply | Qty: 30 | Fill #4

## 2016-07-29 MED FILL — glipiZIDE ER 10 MG TB24: 10 | 30 days supply | Qty: 30 | Fill #3

## 2016-07-29 MED FILL — ATORVASTATIN 80 MG TABLET: 80 | 30 days supply | Qty: 30 | Fill #2

## 2016-08-02 ENCOUNTER — Encounter: Payer: Self-pay | Admitting: Family Medicine

## 2016-08-02 ENCOUNTER — Ambulatory Visit: Payer: Medicaid Other | Attending: Family Medicine | Admitting: Family Medicine

## 2016-08-02 VITALS — BP 119/82 | HR 86 | Temp 97.5°F | Ht 67.0 in | Wt 192.2 lb

## 2016-08-02 DIAGNOSIS — D649 Anemia, unspecified: Secondary | ICD-10-CM | POA: Diagnosis not present

## 2016-08-02 DIAGNOSIS — F419 Anxiety disorder, unspecified: Secondary | ICD-10-CM | POA: Diagnosis not present

## 2016-08-02 DIAGNOSIS — I5032 Chronic diastolic (congestive) heart failure: Secondary | ICD-10-CM | POA: Insufficient documentation

## 2016-08-02 DIAGNOSIS — E119 Type 2 diabetes mellitus without complications: Secondary | ICD-10-CM | POA: Diagnosis present

## 2016-08-02 DIAGNOSIS — Z79899 Other long term (current) drug therapy: Secondary | ICD-10-CM | POA: Insufficient documentation

## 2016-08-02 DIAGNOSIS — N179 Acute kidney failure, unspecified: Secondary | ICD-10-CM | POA: Diagnosis not present

## 2016-08-02 DIAGNOSIS — I110 Hypertensive heart disease with heart failure: Secondary | ICD-10-CM | POA: Insufficient documentation

## 2016-08-02 DIAGNOSIS — IMO0002 Reserved for concepts with insufficient information to code with codable children: Secondary | ICD-10-CM

## 2016-08-02 DIAGNOSIS — Z89422 Acquired absence of other left toe(s): Secondary | ICD-10-CM | POA: Diagnosis not present

## 2016-08-02 DIAGNOSIS — Z794 Long term (current) use of insulin: Secondary | ICD-10-CM | POA: Insufficient documentation

## 2016-08-02 DIAGNOSIS — Z7982 Long term (current) use of aspirin: Secondary | ICD-10-CM | POA: Diagnosis not present

## 2016-08-02 DIAGNOSIS — E118 Type 2 diabetes mellitus with unspecified complications: Secondary | ICD-10-CM | POA: Insufficient documentation

## 2016-08-02 DIAGNOSIS — E1165 Type 2 diabetes mellitus with hyperglycemia: Secondary | ICD-10-CM | POA: Diagnosis not present

## 2016-08-02 DIAGNOSIS — F329 Major depressive disorder, single episode, unspecified: Secondary | ICD-10-CM | POA: Diagnosis not present

## 2016-08-02 LAB — FERRITIN: Ferritin: 547 ng/mL — ABNORMAL HIGH (ref 20–380)

## 2016-08-02 LAB — BASIC METABOLIC PANEL WITH GFR
BUN: 22 mg/dL (ref 7–25)
CALCIUM: 8.7 mg/dL (ref 8.6–10.3)
CHLORIDE: 103 mmol/L (ref 98–110)
CO2: 26 mmol/L (ref 20–31)
Creat: 1.79 mg/dL — ABNORMAL HIGH (ref 0.60–1.35)
GFR, EST NON AFRICAN AMERICAN: 44 mL/min — AB (ref >=60)
GFR, Est African American: 51 mL/min — ABNORMAL LOW (ref >=60)
Glucose, Bld: 240 mg/dL — ABNORMAL HIGH (ref 65–99)
Potassium: 5.1 mmol/L (ref 3.5–5.3)
SODIUM: 140 mmol/L (ref 135–146)

## 2016-08-02 LAB — GLUCOSE, POCT (MANUAL RESULT ENTRY): POC Glucose: 201 mg/dl — AB (ref 70–99)

## 2016-08-02 LAB — IRON AND TIBC
%SAT: 40 % (ref 15–60)
Iron: 106 ug/dL (ref 50–180)
TIBC: 266 ug/dL (ref 250–425)
UIBC: 160 ug/dL (ref 125–400)

## 2016-08-02 LAB — CBC
HEMATOCRIT: 32.8 % — AB (ref 38.5–50.0)
Hemoglobin: 10.3 g/dL — ABNORMAL LOW (ref 13.2–17.1)
MCH: 30.4 pg (ref 27.0–33.0)
MCHC: 31.4 g/dL — ABNORMAL LOW (ref 32.0–36.0)
MCV: 96.8 fL (ref 80.0–100.0)
MPV: 13.5 fL — AB (ref 7.5–12.5)
PLATELETS: 193 10*3/uL (ref 140–400)
RBC: 3.39 MIL/uL — ABNORMAL LOW (ref 4.20–5.80)
RDW: 13.7 % (ref 11.0–15.0)
WBC: 10.4 10*3/uL (ref 3.8–10.8)

## 2016-08-02 MED ORDER — INSULIN PEN NEEDLE 31G X 8 MM MISC: 1.0000 "application " | Freq: Three times a day (TID) | 11 refills | Status: DC

## 2016-08-02 MED ORDER — EXENATIDE 5 MCG/0.02ML ~~LOC~~ SOPN: 5.0000 ug | PEN_INJECTOR | Freq: Two times a day (BID) | SUBCUTANEOUS | 2 refills | Status: DC

## 2016-08-02 MED ORDER — ACCU-CHEK AVIVA PLUS W/DEVICE KIT: PACK | 0 refills | Status: DC

## 2016-08-02 MED FILL — BYETTA 5 MCG DOSE PEN INJ: 5 | 30 days supply | Qty: 1 | Fill #0

## 2016-08-02 MED FILL — BD PEN NDL SHORT 31GX5/16: 31G X 8 MM | 33 days supply | Qty: 100 | Fill #0

## 2016-08-02 NOTE — Progress Notes (Signed)
Subjective:  Patient ID: Shane [name], male    DOB: 06-11-1971  Age: 46 y.o. MRN: [id number]  CC: No chief complaint on file.   HPI Shane [name] has diabetes with complications,  Anemia, HTN, depression and anxiety, chronic diastolic dysfunction he presents for    1. Post op f/u for left transmetarsal amputation on 06/26/2016. Patient has diabetes. This is his 5th amputation from his L foot. He required amputation due to cellulitis, abscess and osteomyelitis.  His pain is well controlled.   2. Diastolic CHF: taking lasix. Wonders if he needs referral for cardiology. Last ECHO was 01/2016. Had some leg swelling. No CP, SOB or cough.   3. DM2: reports improved blood sugar control but excessive appetite on lantus. Has gained weight. He would like help controlling his appetite and weight.    Past Surgical History:  Procedure Laterality Date  . AMPUTATION Left 08/03/2015   Procedure: AMPUTATION LEFT GREAT TOE;  Surgeon: Newt Minion, MD;  Location: [place];  Service: Orthopedics;  Laterality: Left;  . AMPUTATION Left 12/02/2015   Procedure: amputation of left 2nd digit  . AMPUTATION Left 04/10/2016   Procedure: Left 2nd Toe Amputation at MTP Joint;  Surgeon: Newt Minion, MD;  Location: [place];  Service: Orthopedics;  Laterality: Left;  . AMPUTATION Left 06/09/2016   Procedure: AMPUTATION LEFT THIRD TOE;  Surgeon: [name], MD;  Location: [place];  Service: Orthopedics;  Laterality: Left;  . AMPUTATION Left 06/26/2016   Procedure: Left Foot Transmetatarsal Amputation;  Surgeon: Newt Minion, MD;  Location: [place];  Service: Orthopedics;  Laterality: Left;  . APPLICATION OF WOUND VAC Left 12/19/2015   Procedure: APPLICATION OF WOUND VAC;  Surgeon: Meredith Pel, MD;  Location: [place];  Service: Orthopedics;  Laterality: Left;  . CIRCUMCISION    . COLONOSCOPY N/A 01/22/2016   Procedure: COLONOSCOPY;  Surgeon: Gatha Mayer, MD;  Location: [place];  Service: Endoscopy;  Laterality: N/A;   . ESOPHAGOGASTRODUODENOSCOPY N/A 01/19/2016   Procedure: ESOPHAGOGASTRODUODENOSCOPY (EGD);  Surgeon: Manus Gunning, MD;  Location: [place];  Service: Gastroenterology;  Laterality: N/A;  . ESOPHAGOGASTRODUODENOSCOPY N/A 01/22/2016   Procedure: ESOPHAGOGASTRODUODENOSCOPY (EGD);  Surgeon: Gatha Mayer, MD;  [facility] ENDOSCOPY;  Service: Endoscopy;  Laterality: N/A;  . I&D EXTREMITY Left 12/02/2015   Procedure: IRRIGATION AND DEBRIDEMENT OF FOOT; LEFT SECOND TOE AMPUTATION;  Surgeon: Meredith Pel, MD;  Location: [place];  Service: Orthopedics;  Laterality: Left;  . I&D EXTREMITY Left 12/19/2015   Procedure: I & D LEFT FOOT WITH BEADS ;  Surgeon: Meredith Pel, MD;  Location: [place];  Service: Orthopedics;  Laterality: Left;  . I&D EXTREMITY Right 01/17/2016   Procedure: IRRIGATION AND DEBRIDEMENT RIGHT FOOT;  Surgeon: Newt Minion, MD;  Location: [place];  Service: Orthopedics;  Laterality: Right;  . INCISION AND DRAINAGE FOOT Right 01/17/2016  . INGUINAL HERNIA REPAIR Bilateral ~ 1983- ~ 1986  . TONSILLECTOMY  ~ 65    Social History  Substance Use Topics  . Smoking status: Never Smoker  . Smokeless tobacco: Never Used  . Alcohol use No    Outpatient Medications Prior to Visit  Medication Sig Dispense Refill  . ACCU-CHEK SOFTCLIX LANCETS lancets Use as instructed 100 each 12  . amLODipine (NORVASC) 10 MG tablet Take 1 tablet (10 mg total) by mouth daily. 30 tablet 0  . aspirin EC 81 MG tablet Take 1 tablet (81 mg total) by mouth daily. [place]  tablet 11  . atorvastatin (LIPITOR) 80 MG tablet Take 1 tablet (80 mg total) by mouth every morning. 30 tablet 5  . Blood Glucose Monitoring Suppl (ACCU-CHEK AVIVA PLUS) w/Device KIT Use as directed 1 kit 0  . carvedilol (COREG) 25 MG tablet Take 1 tablet (25 mg total) by mouth 2 (two) times daily with a meal. 60 tablet 5  . divalproex (DEPAKOTE) 500 MG DR tablet Take 2 tablets (1,000 mg total) by mouth at bedtime. 30 tablet 5  .  FLUoxetine (PROZAC) 20 MG tablet Take 1 tablet (20 mg total) by mouth every morning. 30 tablet 5  . folic acid (FOLVITE) 1 MG tablet Take 1 tablet (1 mg total) by mouth daily. 30 tablet 3  . furosemide (LASIX) 40 MG tablet Take 1 tablet (40 mg total) by mouth daily as needed for fluid or edema (or weight gain 3 lbs in 1 day or 5 lbs in 1 week). 30 tablet 1  . gabapentin (NEURONTIN) 100 MG capsule Take 2 capsules (200 mg total) by mouth 3 (three) times daily. 90 capsule 0  . glucose blood (ACCU-CHEK AVIVA PLUS) test strip Use as instructed 100 each 12  . ibuprofen (ADVIL,MOTRIN) 800 MG tablet TAKE 1 TABLET BY MOUTH EVERY 8 HOURS AS NEEDED. 30 tablet 0  . Insulin Glargine (LANTUS SOLOSTAR) 100 UNIT/ML Solostar Pen Inject 18 Units into the skin every morning. 3 pen 11  . Lancet Devices (ACCU-CHEK SOFTCLIX) lancets Use as instructed 1 each 0  . metFORMIN (GLUCOPHAGE) 1000 MG tablet Take 1 tablet (1,000 mg total) by mouth 2 (two) times daily with a meal. 60 tablet 5  . metoCLOPramide (REGLAN) 5 MG tablet Take 1 tablet (5 mg total) by mouth 3 (three) times daily before meals. 90 tablet 2  . ondansetron (ZOFRAN ODT) 8 MG disintegrating tablet Take 1 tablet (8 mg total) by mouth every 8 (eight) hours as needed for nausea or vomiting. 30 tablet 0  . oxyCODONE-acetaminophen (PERCOCET/ROXICET) 5-325 MG tablet Take 1-2 tablets by mouth every 8 (eight) hours as needed for moderate pain or severe pain. Do not exceed 3gm of APAP from all sources/24hrs 180 tablet 0  . pantoprazole (PROTONIX) 40 MG tablet Take 40 mg by mouth daily.   5  . polyethylene glycol (MIRALAX / GLYCOLAX) packet Take 17 g by mouth daily as needed for moderate constipation. 14 each 0  . ranitidine (ZANTAC) 300 MG tablet Take 1 tablet (300 mg total) by mouth at bedtime. 30 tablet 5  . senna-docusate (SENOKOT-S) 8.6-50 MG tablet Take 1 tablet by mouth at bedtime. 60 tablet 2  . traZODone (DESYREL) 100 MG tablet Take 1 tablet (100 mg total) by  mouth at bedtime. 30 tablet 5  . triamcinolone cream (KENALOG) 0.5 % Apply to affected area topically every day and evening shift for affected area.    . vitamin B-12 1000 MCG tablet Take 1 tablet (1,000 mcg total) by mouth daily. 30 tablet 3  . Vitamin D, Ergocalciferol, (DRISDOL) 50000 units CAPS capsule Take 1 capsule (50,000 Units total) by mouth every 30 (thirty) days. Every 30 days 12 capsule 1   No facility-administered medications prior to visit.     ROS Review of Systems  Constitutional: Positive for fatigue. Negative for chills, fever and unexpected weight change.  HENT: Positive for hearing loss and tinnitus.   Eyes: Negative for visual disturbance.  Respiratory: Negative for cough, chest tightness and shortness of breath.   Cardiovascular: Negative for chest pain, palpitations and  leg swelling.  Gastrointestinal: Negative for abdominal distention, abdominal pain, anal bleeding, blood in stool, constipation, diarrhea, nausea, rectal pain and vomiting.  Endocrine: Negative for polydipsia, polyphagia and polyuria.  Musculoskeletal: Positive for arthralgias. Negative for back pain, gait problem, joint swelling, myalgias and neck pain.  Skin: Positive for wound (dorsum on R foot and L foot ). Negative for rash.  Allergic/Immunologic: Negative for immunocompromised state.  Neurological: Negative for dizziness and numbness.  Hematological: Negative for adenopathy. Does not bruise/bleed easily.  Psychiatric/Behavioral: Negative for dysphoric mood, sleep disturbance and suicidal ideas. The patient is not nervous/anxious.     Objective:  BP 119/82 (BP Location: Left Arm, Patient Position: Sitting, Cuff Size: Small)   Pulse 86   Temp 97.5 F (36.4 C) (Oral)   Ht _0  (1.702 m)   Wt 192 lb 3.2 oz (87.2 kg)   SpO2 100%   BMI 30.10 kg/m   BP/Weight 08/02/2016 0/12/5730 07/22/6718  Systolic BP 919 802 -  Diastolic BP 82 85 -  Wt. (Lbs) 192.2 174.2 174  BMI 30.1 27.28 27.25    Physical Exam  Constitutional: He appears well-developed and well-nourished. No distress.  HENT:  Head: Normocephalic and atraumatic.  Neck: Normal range of motion. Neck supple.  Cardiovascular: Normal rate, regular rhythm, normal heart sounds and intact distal pulses.   Pulmonary/Chest: Effort normal and breath sounds normal.  Musculoskeletal: He exhibits no edema.       Feet:  Neurological: He is alert.  Skin: Skin is warm and dry. No rash noted. No erythema.  Psychiatric: He has a normal mood and affect.   Lab Results  Component Value Date   HGBA1C 8.7 (H) 06/08/2016   CBG 201  Assessment & Plan:   Diagnoses and all orders for this visit:  Uncontrolled type 2 diabetes mellitus with complication, with long-term current use of insulin (HCC) -     POCT glucose (manual entry) -     BASIC METABOLIC PANEL WITH GFR -     Blood Glucose Monitoring Suppl (ACCU-CHEK AVIVA PLUS) w/Device KIT; Use as directed -     exenatide (BYETTA 5 MCG PEN) 5 MCG/0.02ML SOPN injection; Inject 0.02 mLs (5 mcg total) into the skin 2 (two) times daily with a meal. -     Insulin Pen Needle (B-D ULTRAFINE III SHORT PEN) 31G X 8 MM MISC; 1 application by Does not apply route 3 (three) times daily.  Chronic diastolic CHF (congestive heart failure), NYHA class 1 (HCC) -     BASIC METABOLIC PANEL WITH GFR -     ECHOCARDIOGRAM COMPLETE; Future  Symptomatic anemia -     CBC -     Iron and TIBC -     Ferritin     No orders of the defined types were placed in this encounter.   Follow-up: Return in about 3 months (around 10/30/2016) for diabetes .   Boykin Nearing MD

## 2016-08-02 NOTE — Patient Instructions (Addendum)
Diagnoses and all orders for this visit:  Uncontrolled type 2 diabetes mellitus with complication, with long-term current use of insulin (HCC) -     POCT glucose (manual entry) -     BASIC METABOLIC PANEL WITH GFR -     Blood Glucose Monitoring Suppl (ACCU-CHEK AVIVA PLUS) w/Device KIT; Use as directed -     exenatide (BYETTA 5 MCG PEN) 5 MCG/0.02ML SOPN injection; Inject 0.02 mLs (5 mcg total) into the skin 2 (two) times daily with a meal. -     Insulin Pen Needle (B-D ULTRAFINE III SHORT PEN) 31G X 8 MM MISC; 1 application by Does not apply route 3 (three) times daily.  Chronic diastolic CHF (congestive heart failure), NYHA class 1 (HCC) -     BASIC METABOLIC PANEL WITH GFR -     ECHOCARDIOGRAM COMPLETE; Future  Symptomatic anemia -     CBC -     Iron and TIBC -     Ferritin  check and write down blood sugars  Diabetes blood sugar goals  Fasting (in AM before breakfast, 8 hrs of no eating or drinking (except water or unsweetened coffee or tea): 90-130 2 hrs after meals: < 160,   No low sugars: nothing < 70   Be sure to use this med list as your active and current medication list  F/u in 3 weeks for diabetes/ CBG review since starting victoza.   Dr. Adrian Blackwater

## 2016-08-05 ENCOUNTER — Other Ambulatory Visit: Payer: Self-pay | Admitting: Family Medicine

## 2016-08-05 ENCOUNTER — Inpatient Hospital Stay: Payer: Self-pay | Admitting: Family Medicine

## 2016-08-05 NOTE — Assessment & Plan Note (Signed)
Add byetta for sugar and appetite control

## 2016-08-05 NOTE — Assessment & Plan Note (Addendum)
Continue lasix Patient is fluid balanced per exam Repeat ECHO  Creatinine elevated in setting of daily lasix use, change to every other day

## 2016-08-06 ENCOUNTER — Other Ambulatory Visit: Payer: Self-pay

## 2016-08-06 DIAGNOSIS — E118 Type 2 diabetes mellitus with unspecified complications: Principal | ICD-10-CM

## 2016-08-06 DIAGNOSIS — Z794 Long term (current) use of insulin: Principal | ICD-10-CM

## 2016-08-06 DIAGNOSIS — IMO0002 Reserved for concepts with insufficient information to code with codable children: Secondary | ICD-10-CM

## 2016-08-06 DIAGNOSIS — E1165 Type 2 diabetes mellitus with hyperglycemia: Secondary | ICD-10-CM

## 2016-08-06 MED ORDER — ACCU-CHEK AVIVA PLUS W/DEVICE KIT: PACK | 0 refills | Status: DC

## 2016-08-06 MED ORDER — FUROSEMIDE 40 MG PO TABS: 40.0000 mg | ORAL_TABLET | ORAL | 1 refills | Status: DC

## 2016-08-06 MED ORDER — ACCU-CHEK SOFTCLIX LANCET DEV MISC: 0 refills | Status: DC

## 2016-08-06 MED FILL — ACCU-CHEK AVIVA PLUS METER: W/DEVICE | 365 days supply | Qty: 1 | Fill #0

## 2016-08-06 MED FILL — metFORMIN HCL 1000 MG TABS: 1000 | 30 days supply | Qty: 60 | Fill #0

## 2016-08-06 MED FILL — FUROSEMIDE 40 MG TABLET: 40 | 60 days supply | Qty: 30 | Fill #0

## 2016-08-06 NOTE — Assessment & Plan Note (Signed)
AKI Recent addition of lasix Taper down on lasix to every other day Avoid NSAIDs Monitor renal function closely

## 2016-08-06 NOTE — Addendum Note (Signed)
Addended by: Boykin Nearing on: 08/06/2016 08:26 AM   Modules accepted: Orders

## 2016-08-07 ENCOUNTER — Telehealth: Payer: Self-pay

## 2016-08-07 NOTE — Telephone Encounter (Signed)
Pt was called to go over lab results, pt VM did not pick up to leave a message.

## 2016-08-07 NOTE — Telephone Encounter (Signed)
Pt was called and informed of lab results and change in medication. 

## 2016-08-08 ENCOUNTER — Other Ambulatory Visit: Payer: Self-pay

## 2016-08-08 DIAGNOSIS — E118 Type 2 diabetes mellitus with unspecified complications: Principal | ICD-10-CM

## 2016-08-08 DIAGNOSIS — E1165 Type 2 diabetes mellitus with hyperglycemia: Secondary | ICD-10-CM

## 2016-08-08 DIAGNOSIS — IMO0002 Reserved for concepts with insufficient information to code with codable children: Secondary | ICD-10-CM

## 2016-08-08 MED ORDER — GLUCOSE BLOOD VI STRP: ORAL_STRIP | 12 refills | Status: DC

## 2016-08-09 MED FILL — traZODone HCL 100 MG TABS: 100 | 30 days supply | Qty: 30 | Fill #3

## 2016-08-13 ENCOUNTER — Ambulatory Visit (HOSPITAL_COMMUNITY)
Admission: RE | Admit: 2016-08-13 | Discharge: 2016-08-13 | Disposition: A | Discharge status: Home / Self Care | Payer: Medicaid Other | Source: Ambulatory Visit | Attending: Family Medicine | Admitting: Family Medicine

## 2016-08-13 DIAGNOSIS — I5032 Chronic diastolic (congestive) heart failure: Secondary | ICD-10-CM | POA: Insufficient documentation

## 2016-08-13 LAB — ECHOCARDIOGRAM COMPLETE
FS: 34 % (ref 28–44)
IV/PV OW: 0.85
LA vol A4C: 42.6 ml
LA vol: 45.6 mL
LADIAMINDEX: 2.06 cm/m2
LASIZE: 41 mm
LAVOLIN: 22.9 mL/m2
LDCA: 4.52 cm2
LEFT ATRIUM END SYS DIAM: 41 mm
LV PW d: 12.5 mm — AB (ref 0.6–1.1)
LVELAT: 10.6 cm/s
LVOT SV: 59 mL
LVOT VTI: 13.1 cm
LVOT diameter: 24 mm
LVOT peak grad rest: 3 mmHg
LVOTPV: 86.5 cm/s
RV LATERAL S' VELOCITY: 15.1 cm/s
TAPSE: 19.5 mm
TDI e' lateral: 10.6
TDI e' medial: 6.53

## 2016-08-13 NOTE — Progress Notes (Signed)
  Echocardiogram 2D Echocardiogram has been performed.  Shane [name] 08/13/2016, 1:45 PM

## 2016-08-16 ENCOUNTER — Telehealth: Payer: Self-pay

## 2016-08-16 NOTE — Telephone Encounter (Signed)
Pt was called and informed of ECHO results and to continue taking lasix medication.

## 2016-08-19 ENCOUNTER — Encounter (INDEPENDENT_AMBULATORY_CARE_PROVIDER_SITE_OTHER): Payer: Self-pay | Admitting: Orthopedic Surgery

## 2016-08-19 ENCOUNTER — Ambulatory Visit (INDEPENDENT_AMBULATORY_CARE_PROVIDER_SITE_OTHER): Payer: Medicaid Other | Admitting: Orthopedic Surgery

## 2016-08-19 VITALS — Ht 67.0 in | Wt 192.0 lb

## 2016-08-19 DIAGNOSIS — Z89432 Acquired absence of left foot: Secondary | ICD-10-CM

## 2016-08-19 NOTE — Progress Notes (Signed)
Office Visit Note   Patient: Shane [name]           Date of Birth: 18-Mar-1971           MRN: [id number] Visit Date: 08/19/2016              Requested by: Boykin Nearing, MD [address] [place], [place] [address] PCP: Minerva Ends, MD  Chief Complaint  Patient presents with  . Left Foot - Routine Post Op    06/26/16 left foot transmet amputation 54 days post op.     HPI: Patient pt is almost 8 weeks out from a left transmetatarsal amputation. Pt is well healed and states that he has been to [employer] for his extra depth shoe and accessories he is weight bearing in a [place] shoe and with a walker today. Pamella Pert, RMA    Assessment & Plan: Visit Diagnoses: No diagnosis found.  Plan: He will advance to his extra depth shoes custom orthotics and spacer from [employer]. Follow-up in 4 weeks. We'll evaluate for possible need for a carbon plate. Patient is given instructions to work on heel cord stretching.  Follow-Up Instructions: Return in about 4 weeks (around 09/16/2016).   Ortho Exam Examination incision is well-healed there is no redness no cellulitis no ulcers no drainage. ROS: Complete review of system negative except as mentioned in history of present illness Imaging: No results found.  Labs: Lab Results  Component Value Date   HGBA1C 8.7 (H) 06/08/2016   HGBA1C 8.4 05/30/2016   HGBA1C 7.8 (H) 03/09/2016   ESRSEDRATE 22 (H) 06/24/2016   ESRSEDRATE 27 (H) 06/08/2016   ESRSEDRATE 48 (H) 02/20/2016   CRP <0.8 06/24/2016   CRP 0.5 02/20/2016   CRP 0.8 12/31/2015   REPTSTATUS 06/28/2016 FINAL 06/27/2016   GRAMSTAIN  02/21/2016    RARE WBC PRESENT, PREDOMINANTLY MONONUCLEAR RARE GRAM POSITIVE COCCI IN PAIRS    CULT NO GROWTH 06/27/2016   LABORGA STAPHYLOCOCCUS AUREUS 02/21/2016    Orders:  No orders of the defined types were placed in this encounter.  No orders of the defined types were placed in this encounter.    Procedures: No procedures  performed  Clinical Data: No additional findings.  Subjective: Review of Systems  Objective: Vital Signs: Ht 5\' 7"  (1.702 m)   Wt 192 lb (87.1 kg)   BMI 30.07 kg/m   Specialty Comments:  No specialty comments available.  PMFS History: Patient Active Problem List   Diagnosis Date Noted  . Dyslipidemia associated with type 2 diabetes mellitus ([place]) 07/22/2016  . Constipation 07/02/2016  . Osteomyelitis of left foot ([place]) 07/02/2016  . S/P transmetatarsal amputation of foot, left ([place]) 07/02/2016  . Dyspnea on exertion 07/02/2016  . Cellulitis of left lower leg 06/24/2016  . Cellulitis of left lower extremity   . Subacute osteomyelitis, left ankle and foot ([place]) 06/08/2016  . Diabetic gastroparesis ([place]) 05/30/2016  . Chronic ulcer of left foot ([place]) 05/22/2016  . Dermatitis 04/26/2016  . Buzzing in ear, right 03/26/2016  . Joint pain 03/11/2016  . Weakness 03/08/2016  . Dizziness 03/03/2016  . Diabetic ulcer of right foot associated with type 2 diabetes mellitus, with necrosis of muscle ([place]) 02/21/2016  . Chronic diastolic CHF (congestive heart failure), NYHA class 1 ([place]) 12/30/2015  . Diabetes mellitus type 2, uncontrolled, with complications ([place]) [id number]  . Depression with anxiety 12/01/2015  . Pain in the chest 12/01/2015  . Insomnia 11/21/2015  . GERD (gastroesophageal reflux disease) 08/18/2015  .  Abscess of third toe, left 08/01/2015  . Erectile dysfunction 07/04/2015  . Symptomatic anemia 04/24/2015  . AKI (acute kidney injury) ([place]) 04/24/2015  . Left arm weakness 04/02/2015  . Sensory disturbance 04/02/2015  . Essential hypertension 04/02/2015  . Hemispheric carotid artery syndrome   . HLD (hyperlipidemia)   . Headache    Past Medical History:  Diagnosis Date  . Anemia   . Bipolar disorder ([place])   . Cellulitis and abscess of left leg 06/2016  . Chest pain    a. 2015 Reportedly normal stress test in [place].  [place] Kitchen Chronic diastolic CHF (congestive heart  failure) (HCC)    a.03/2015 Echo: EF 55-60%, Gr 1 DD, mild MR, triv PR.  . Depression   . Depression with anxiety   . Dyspnea    with exertion  . GERD (gastroesophageal reflux disease)   . Hyperlipidemia   . Hypertension    a. 08/2014 Admitted with hypertensive urgency.  . Insomnia   . Internal carotid artery stenosis   . Lower GI bleed 07/04/2015  . Neuropathy ([place])   . Refusal of blood transfusions as patient is Jehovah's Witness   . TIA (transient ischemic attack) 08/2014; 03/2015   a. 08/2014 in setting of hypertensive urgency.  . Type II diabetes mellitus ([place])    started insulin spring 2016, Type II  . Vitamin D deficiency spring 2016    Family History  Problem Relation Age of Onset  . Hypertension Mother   . Diabetes Mother   . Hyperlipidemia Mother   . Heart disease Mother     s/p pacemaker  . Diabetes Father   . Hypertension Father   . Stroke Father   . Heart attack Father     first MI @ 70.  . Stroke Brother     Past Surgical History:  Procedure Laterality Date  . AMPUTATION Left 08/03/2015   Procedure: AMPUTATION LEFT GREAT TOE;  Surgeon: Newt Minion, MD;  Location: [place];  Service: Orthopedics;  Laterality: Left;  . AMPUTATION Left 12/02/2015   Procedure: amputation of left 2nd digit  . AMPUTATION Left 04/10/2016   Procedure: Left 2nd Toe Amputation at MTP Joint;  Surgeon: Newt Minion, MD;  Location: [place];  Service: Orthopedics;  Laterality: Left;  . AMPUTATION Left 06/09/2016   Procedure: AMPUTATION LEFT THIRD TOE;  Surgeon: [name], MD;  Location: [place];  Service: Orthopedics;  Laterality: Left;  . AMPUTATION Left 06/26/2016   Procedure: Left Foot Transmetatarsal Amputation;  Surgeon: Newt Minion, MD;  Location: [place];  Service: Orthopedics;  Laterality: Left;  . APPLICATION OF WOUND VAC Left 12/19/2015   Procedure: APPLICATION OF WOUND VAC;  Surgeon: Meredith Pel, MD;  Location: [place];  Service: Orthopedics;  Laterality: Left;  . CIRCUMCISION     . COLONOSCOPY N/A 01/22/2016   Procedure: COLONOSCOPY;  Surgeon: Gatha Mayer, MD;  Location: [place];  Service: Endoscopy;  Laterality: N/A;  . ESOPHAGOGASTRODUODENOSCOPY N/A 01/19/2016   Procedure: ESOPHAGOGASTRODUODENOSCOPY (EGD);  Surgeon: Manus Gunning, MD;  Location: [place];  Service: Gastroenterology;  Laterality: N/A;  . ESOPHAGOGASTRODUODENOSCOPY N/A 01/22/2016   Procedure: ESOPHAGOGASTRODUODENOSCOPY (EGD);  Surgeon: Gatha Mayer, MD;  Location[facility] ENDOSCOPY;  Service: Endoscopy;  Laterality: N/A;  . I&D EXTREMITY Left 12/02/2015   Procedure: IRRIGATION AND DEBRIDEMENT OF FOOT; LEFT SECOND TOE AMPUTATION;  Surgeon: Meredith Pel, MD;  Location: [place];  Service: Orthopedics;  Laterality: Left;  . I&D EXTREMITY Left 12/19/2015  Procedure: I & D LEFT FOOT WITH BEADS ;  Surgeon: Meredith Pel, MD;  Location: [place];  Service: Orthopedics;  Laterality: Left;  . I&D EXTREMITY Right 01/17/2016   Procedure: IRRIGATION AND DEBRIDEMENT RIGHT FOOT;  Surgeon: Newt Minion, MD;  Location: [place];  Service: Orthopedics;  Laterality: Right;  . INCISION AND DRAINAGE FOOT Right 01/17/2016  . INGUINAL HERNIA REPAIR Bilateral ~ 1983- ~ 1986  . TONSILLECTOMY  ~ 60   Social History   Occupational History  . Not on file.   Social History Main Topics  . Smoking status: Never Smoker  . Smokeless tobacco: Never Used  . Alcohol use No  . Drug use: No  . Sexual activity: No

## 2016-08-20 MED FILL — ?CARVEDILOL 12.5 MG TABLET: 12.5 | 30 days supply | Qty: 60 | Fill #2

## 2016-08-23 ENCOUNTER — Encounter: Payer: Self-pay | Admitting: Family Medicine

## 2016-08-23 ENCOUNTER — Ambulatory Visit: Payer: Medicaid Other | Attending: Family Medicine | Admitting: Family Medicine

## 2016-08-23 VITALS — BP 110/80 | HR 91 | Temp 97.5°F | Ht 67.0 in | Wt 195.8 lb

## 2016-08-23 DIAGNOSIS — F419 Anxiety disorder, unspecified: Secondary | ICD-10-CM | POA: Insufficient documentation

## 2016-08-23 DIAGNOSIS — E118 Type 2 diabetes mellitus with unspecified complications: Secondary | ICD-10-CM | POA: Insufficient documentation

## 2016-08-23 DIAGNOSIS — K219 Gastro-esophageal reflux disease without esophagitis: Secondary | ICD-10-CM | POA: Insufficient documentation

## 2016-08-23 DIAGNOSIS — I10 Essential (primary) hypertension: Secondary | ICD-10-CM | POA: Diagnosis not present

## 2016-08-23 DIAGNOSIS — E1165 Type 2 diabetes mellitus with hyperglycemia: Secondary | ICD-10-CM

## 2016-08-23 DIAGNOSIS — IMO0002 Reserved for concepts with insufficient information to code with codable children: Secondary | ICD-10-CM

## 2016-08-23 DIAGNOSIS — Z794 Long term (current) use of insulin: Secondary | ICD-10-CM | POA: Insufficient documentation

## 2016-08-23 DIAGNOSIS — N183 Chronic kidney disease, stage 3 unspecified: Secondary | ICD-10-CM

## 2016-08-23 DIAGNOSIS — E1122 Type 2 diabetes mellitus with diabetic chronic kidney disease: Secondary | ICD-10-CM | POA: Insufficient documentation

## 2016-08-23 DIAGNOSIS — Z7982 Long term (current) use of aspirin: Secondary | ICD-10-CM | POA: Insufficient documentation

## 2016-08-23 DIAGNOSIS — I129 Hypertensive chronic kidney disease with stage 1 through stage 4 chronic kidney disease, or unspecified chronic kidney disease: Secondary | ICD-10-CM | POA: Insufficient documentation

## 2016-08-23 DIAGNOSIS — F329 Major depressive disorder, single episode, unspecified: Secondary | ICD-10-CM | POA: Insufficient documentation

## 2016-08-23 DIAGNOSIS — Z79899 Other long term (current) drug therapy: Secondary | ICD-10-CM | POA: Insufficient documentation

## 2016-08-23 LAB — POCT GLYCOSYLATED HEMOGLOBIN (HGB A1C): HEMOGLOBIN A1C: 7.6

## 2016-08-23 LAB — GLUCOSE, POCT (MANUAL RESULT ENTRY): POC Glucose: 331 mg/dl — AB (ref 70–99)

## 2016-08-23 MED ORDER — INSULIN ASPART 100 UNIT/ML ~~LOC~~ SOLN
10.0000 [IU] | Freq: Once | SUBCUTANEOUS | Status: AC
  Administered 2016-08-23: 10 [IU] via SUBCUTANEOUS

## 2016-08-23 MED ORDER — INSULIN GLARGINE 100 UNIT/ML SOLOSTAR PEN: 10.0000 [IU] | PEN_INJECTOR | SUBCUTANEOUS | 11 refills | Status: DC

## 2016-08-23 MED ORDER — EXENATIDE 10 MCG/0.04ML ~~LOC~~ SOPN: 10.0000 ug | PEN_INJECTOR | Freq: Two times a day (BID) | SUBCUTANEOUS | 5 refills | Status: DC

## 2016-08-23 MED ORDER — RANITIDINE HCL 300 MG PO TABS: 300.0000 mg | ORAL_TABLET | Freq: Every day | ORAL | 5 refills | Status: DC

## 2016-08-23 NOTE — Progress Notes (Signed)
Subjective:  Patient ID: Shane [name], male    DOB: Mar 15, 1971  Age: 46 y.o. MRN: [id number]  CC: Diabetes   HPI Shane [name] has diabetes with complications,  Anemia, HTN, depression and anxiety, chronic diastolic dysfunction he presents for  presents for   1. CHRONIC DIABETES  Disease Monitoring  Blood Sugar Ranges:   Fasting: 96-112  Postprandial: 41-150  Polyuria: yes   Visual problems: yes   Medication Compliance: no, stopped lantus one week ago due to low sugar with byetta   Medication Side Effects  Hypoglycemia: yes, down to [place] Exam: due    Social History  Substance Use Topics  . Smoking status: Never Smoker  . Smokeless tobacco: Never Used  . Alcohol use No    Outpatient Medications Prior to Visit  Medication Sig Dispense Refill  . ACCU-CHEK SOFTCLIX LANCETS lancets Use as instructed 100 each 12  . amLODipine (NORVASC) 10 MG tablet Take 1 tablet (10 mg total) by mouth daily. 30 tablet 0  . aspirin EC 81 MG tablet Take 1 tablet (81 mg total) by mouth daily. 30 tablet 11  . atorvastatin (LIPITOR) 80 MG tablet Take 1 tablet (80 mg total) by mouth every morning. 30 tablet 5  . Blood Glucose Monitoring Suppl (ACCU-CHEK AVIVA PLUS) w/Device KIT Use as directed 1 kit 0  . carvedilol (COREG) 25 MG tablet Take 1 tablet (25 mg total) by mouth 2 (two) times daily with a meal. 60 tablet 5  . divalproex (DEPAKOTE) 500 MG DR tablet Take 2 tablets (1,000 mg total) by mouth at bedtime. 30 tablet 5  . exenatide (BYETTA 5 MCG PEN) 5 MCG/0.02ML SOPN injection Inject 0.02 mLs (5 mcg total) into the skin 2 (two) times daily with a meal. 1.2 mL 2  . FLUoxetine (PROZAC) 20 MG tablet Take 1 tablet (20 mg total) by mouth every morning. 30 tablet 5  . folic acid (FOLVITE) 1 MG tablet Take 1 tablet (1 mg total) by mouth daily. 30 tablet 3  . furosemide (LASIX) 40 MG tablet Take 1 tablet (40 mg total) by mouth every other day. 30 tablet 1  . gabapentin  (NEURONTIN) 100 MG capsule Take 2 capsules (200 mg total) by mouth 3 (three) times daily. 90 capsule 0  . glucose blood (ACCU-CHEK AVIVA PLUS) test strip Use as instructed 100 each 12  . Insulin Glargine (LANTUS SOLOSTAR) 100 UNIT/ML Solostar Pen Inject 18 Units into the skin every morning. 3 pen 11  . Insulin Pen Needle (B-D ULTRAFINE III SHORT PEN) 31G X 8 MM MISC 1 application by Does not apply route 3 (three) times daily. 90 each 11  . Lancet Devices (ACCU-CHEK SOFTCLIX) lancets Use as instructed 1 each 0  . metFORMIN (GLUCOPHAGE) 1000 MG tablet TAKE 1 TABLET BY MOUTH 2 TIMES DAILY WITH A MEAL. 60 tablet 0  . metoCLOPramide (REGLAN) 5 MG tablet Take 1 tablet (5 mg total) by mouth 3 (three) times daily before meals. 90 tablet 2  . ondansetron (ZOFRAN ODT) 8 MG disintegrating tablet Take 1 tablet (8 mg total) by mouth every 8 (eight) hours as needed for nausea or vomiting. 30 tablet 0  . oxyCODONE-acetaminophen (PERCOCET/ROXICET) 5-325 MG tablet Take 1-2 tablets by mouth every 8 (eight) hours as needed for moderate pain or severe pain. Do not exceed 3gm of APAP from all sources/24hrs 180 tablet 0  . pantoprazole (PROTONIX) 40 MG tablet Take 40 mg by mouth daily.   5  .  polyethylene glycol (MIRALAX / GLYCOLAX) packet Take 17 g by mouth daily as needed for moderate constipation. 14 each 0  . ranitidine (ZANTAC) 300 MG tablet Take 1 tablet (300 mg total) by mouth at bedtime. 30 tablet 5  . senna-docusate (SENOKOT-S) 8.6-50 MG tablet Take 1 tablet by mouth at bedtime. 60 tablet 2  . traZODone (DESYREL) 100 MG tablet Take 1 tablet (100 mg total) by mouth at bedtime. 30 tablet 5  . triamcinolone cream (KENALOG) 0.5 % Apply to affected area topically every day and evening shift for affected area.    . vitamin B-12 1000 MCG tablet Take 1 tablet (1,000 mcg total) by mouth daily. 30 tablet 3  . Vitamin D, Ergocalciferol, (DRISDOL) 50000 units CAPS capsule Take 1 capsule (50,000 Units total) by mouth every 30  (thirty) days. Every 30 days 12 capsule 1   No facility-administered medications prior to visit.     ROS Review of Systems  Constitutional: Negative for chills, fatigue, fever and unexpected weight change.  Eyes: Negative for visual disturbance.  Respiratory: Negative for cough and shortness of breath.   Cardiovascular: Negative for chest pain, palpitations and leg swelling.  Gastrointestinal: Negative for abdominal pain, blood in stool, constipation, diarrhea, nausea and vomiting.  Endocrine: Negative for polydipsia, polyphagia and polyuria.  Musculoskeletal: Negative for arthralgias, back pain, gait problem, myalgias and neck pain.  Skin: Negative for rash.  Allergic/Immunologic: Negative for immunocompromised state.  Hematological: Negative for adenopathy. Does not bruise/bleed easily.  Psychiatric/Behavioral: Negative for dysphoric mood, sleep disturbance and suicidal ideas. The patient is not nervous/anxious.     Objective:  BP 110/80 (BP Location: Left Arm, Patient Position: Sitting, Cuff Size: Small)   Pulse 91   Temp 97.5 F (36.4 C) (Oral)   Ht _0  (1.702 m)   Wt 195 lb 12.8 oz (88.8 kg)   SpO2 100%   BMI 30.67 kg/m   BP/Weight 08/23/2016 08/19/2016 5/72/6203  Systolic BP 559 - 741  Diastolic BP 80 - 82  Wt. (Lbs) 195.8 192 192.2  BMI 30.67 30.07 30.1    Physical Exam  Constitutional: He appears well-developed and well-nourished. No distress.  HENT:  Head: Normocephalic and atraumatic.  Neck: Normal range of motion. Neck supple.  Cardiovascular: Normal rate, regular rhythm, normal heart sounds and intact distal pulses.   Pulmonary/Chest: Effort normal and breath sounds normal.  Musculoskeletal: He exhibits no edema.  Neurological: He is alert.  Skin: Skin is warm and dry. No rash noted. No erythema.  Psychiatric: He has a normal mood and affect.   Lab Results  Component Value Date   HGBA1C 8.7 (H) 06/08/2016   CBG 331  Treated with 10 U of  novolog  Assessment & Plan:   [name] was seen today for diabetes.  Diagnoses and all orders for this visit:  Uncontrolled type 2 diabetes mellitus with complication, with long-term current use of insulin (HCC) -     POCT glucose (manual entry) -     POCT glycosylated hemoglobin (Hb A1C) -     insulin aspart (novoLOG) injection 10 Units; Inject 0.1 mLs (10 Units total) into the skin once. -     Insulin Glargine (LANTUS SOLOSTAR) 100 UNIT/ML Solostar Pen; Inject 10 Units into the skin every morning. -     exenatide (BYETTA 10 MCG PEN) 10 MCG/0.04ML SOPN injection; Inject 0.04 mLs (10 mcg total) into the skin 2 (two) times daily with a meal.  Gastroesophageal reflux disease without esophagitis -  ranitidine (ZANTAC) 300 MG tablet; Take 1 tablet (300 mg total) by mouth at bedtime.  Essential hypertension  CKD (chronic kidney disease) stage 3, GFR 30-59 ml/min    Meds ordered this encounter  Medications  . insulin aspart (novoLOG) injection 10 Units    Follow-up: Return in about 6 weeks (around 10/04/2016) for diabetes .   Boykin Nearing MD

## 2016-08-23 NOTE — Patient Instructions (Addendum)
Shane [name] was seen today for diabetes.  Diagnoses and all orders for this visit:  Uncontrolled type 2 diabetes mellitus with complication, with long-term current use of insulin (HCC) -     POCT glucose (manual entry) -     POCT glycosylated hemoglobin (Hb A1C) -     insulin aspart (novoLOG) injection 10 Units; Inject 0.1 mLs (10 Units total) into the skin once. -     Insulin Glargine (LANTUS SOLOSTAR) 100 UNIT/ML Solostar Pen; Inject 10 Units into the skin every morning. -     exenatide (BYETTA 10 MCG PEN) 10 MCG/0.04ML SOPN injection; Inject 0.04 mLs (10 mcg total) into the skin 2 (two) times daily with a meal.  Gastroesophageal reflux disease without esophagitis -     ranitidine (ZANTAC) 300 MG tablet; Take 1 tablet (300 mg total) by mouth at bedtime.  Essential hypertension   Continue lasix every other day 40 mg  Continue coreg 2 mg twice daily  Reduce lantus to 5 U daily if blood sugars get low  F/u in 6 week for diabetes  Dr. Adrian Blackwater

## 2016-08-27 DIAGNOSIS — N183 Chronic kidney disease, stage 3 unspecified: Secondary | ICD-10-CM | POA: Insufficient documentation

## 2016-08-27 NOTE — Assessment & Plan Note (Signed)
A: uncontrolled with hyperglycemia P: Increase byetta to 10 mcg BID Restart lantus, reduce to 10 U daily

## 2016-09-01 ENCOUNTER — Emergency Department (HOSPITAL_COMMUNITY)
Admission: EM | Admit: 2016-09-01 | Discharge: 2016-09-01 | Disposition: A | Discharge status: Home / Self Care | Payer: Self-pay | Attending: Emergency Medicine | Admitting: Emergency Medicine

## 2016-09-01 ENCOUNTER — Emergency Department (HOSPITAL_COMMUNITY): Payer: Self-pay

## 2016-09-01 ENCOUNTER — Encounter (HOSPITAL_COMMUNITY): Payer: Self-pay | Admitting: Emergency Medicine

## 2016-09-01 DIAGNOSIS — Z794 Long term (current) use of insulin: Secondary | ICD-10-CM | POA: Insufficient documentation

## 2016-09-01 DIAGNOSIS — I5032 Chronic diastolic (congestive) heart failure: Secondary | ICD-10-CM | POA: Insufficient documentation

## 2016-09-01 DIAGNOSIS — Z7982 Long term (current) use of aspirin: Secondary | ICD-10-CM | POA: Insufficient documentation

## 2016-09-01 DIAGNOSIS — I130 Hypertensive heart and chronic kidney disease with heart failure and stage 1 through stage 4 chronic kidney disease, or unspecified chronic kidney disease: Secondary | ICD-10-CM | POA: Insufficient documentation

## 2016-09-01 DIAGNOSIS — E1140 Type 2 diabetes mellitus with diabetic neuropathy, unspecified: Secondary | ICD-10-CM | POA: Insufficient documentation

## 2016-09-01 DIAGNOSIS — R030 Elevated blood-pressure reading, without diagnosis of hypertension: Secondary | ICD-10-CM

## 2016-09-01 DIAGNOSIS — N183 Chronic kidney disease, stage 3 (moderate): Secondary | ICD-10-CM | POA: Insufficient documentation

## 2016-09-01 DIAGNOSIS — Z8673 Personal history of transient ischemic attack (TIA), and cerebral infarction without residual deficits: Secondary | ICD-10-CM | POA: Insufficient documentation

## 2016-09-01 DIAGNOSIS — Z79899 Other long term (current) drug therapy: Secondary | ICD-10-CM | POA: Insufficient documentation

## 2016-09-01 DIAGNOSIS — E1122 Type 2 diabetes mellitus with diabetic chronic kidney disease: Secondary | ICD-10-CM | POA: Insufficient documentation

## 2016-09-01 DIAGNOSIS — M79674 Pain in right toe(s): Secondary | ICD-10-CM | POA: Insufficient documentation

## 2016-09-01 DIAGNOSIS — R739 Hyperglycemia, unspecified: Secondary | ICD-10-CM

## 2016-09-01 DIAGNOSIS — E1165 Type 2 diabetes mellitus with hyperglycemia: Secondary | ICD-10-CM | POA: Insufficient documentation

## 2016-09-01 DIAGNOSIS — R0981 Nasal congestion: Secondary | ICD-10-CM | POA: Insufficient documentation

## 2016-09-01 LAB — URINALYSIS, ROUTINE W REFLEX MICROSCOPIC
Bilirubin Urine: NEGATIVE
Ketones, ur: NEGATIVE mg/dL
LEUKOCYTES UA: NEGATIVE
NITRITE: NEGATIVE
PH: 6 (ref 5.0–8.0)
PROTEIN: 100 mg/dL — AB
SPECIFIC GRAVITY, URINE: 1.011 (ref 1.005–1.030)
Squamous Epithelial / LPF: NONE SEEN

## 2016-09-01 LAB — CBC
HCT: 32.3 % — ABNORMAL LOW (ref 39.0–52.0)
Hemoglobin: 10.5 g/dL — ABNORMAL LOW (ref 13.0–17.0)
MCH: 30.8 pg (ref 26.0–34.0)
MCHC: 32.5 g/dL (ref 30.0–36.0)
MCV: 94.7 fL (ref 78.0–100.0)
Platelets: 204 10*3/uL (ref 150–400)
RBC: 3.41 MIL/uL — ABNORMAL LOW (ref 4.22–5.81)
RDW: 13.4 % (ref 11.5–15.5)
WBC: 7.1 10*3/uL (ref 4.0–10.5)

## 2016-09-01 LAB — BASIC METABOLIC PANEL
ANION GAP: 11 (ref 5–15)
BUN: 22 mg/dL — ABNORMAL HIGH (ref 6–20)
CHLORIDE: 99 mmol/L — AB (ref 101–111)
CO2: 27 mmol/L (ref 22–32)
Calcium: 8.8 mg/dL — ABNORMAL LOW (ref 8.9–10.3)
Creatinine, Ser: 1.54 mg/dL — ABNORMAL HIGH (ref 0.61–1.24)
GFR calc non Af Amer: 52 mL/min — ABNORMAL LOW (ref >60)
Glucose, Bld: 243 mg/dL — ABNORMAL HIGH (ref 65–99)
Potassium: 4.5 mmol/L (ref 3.5–5.1)
Sodium: 137 mmol/L (ref 135–145)

## 2016-09-01 LAB — CBG MONITORING, ED
GLUCOSE-CAPILLARY: 224 mg/dL — AB (ref 65–99)
GLUCOSE-CAPILLARY: 238 mg/dL — AB (ref 65–99)

## 2016-09-01 MED ORDER — HYDRALAZINE HCL 20 MG/ML IJ SOLN
10.0000 mg | Freq: Once | INTRAMUSCULAR | Status: AC
  Administered 2016-09-01: 10 mg via INTRAVENOUS
  Filled 2016-09-01: qty 1

## 2016-09-01 MED ORDER — FLUTICASONE PROPIONATE 50 MCG/ACT NA SUSP: 2.0000 | Freq: Every day | NASAL | 0 refills | Status: DC

## 2016-09-01 MED ORDER — SODIUM CHLORIDE 0.9 % IV BOLUS (SEPSIS)
1000.0000 mL | Freq: Once | INTRAVENOUS | Status: AC
  Administered 2016-09-01: 1000 mL via INTRAVENOUS

## 2016-09-01 MED ORDER — LORATADINE 10 MG PO TABS: 10.0000 mg | ORAL_TABLET | Freq: Every day | ORAL | 0 refills | Status: DC

## 2016-09-01 MED ORDER — CLINDAMYCIN PHOSPHATE 600 MG/50ML IV SOLN
600.0000 mg | Freq: Once | INTRAVENOUS | Status: AC
  Administered 2016-09-01: 600 mg via INTRAVENOUS
  Filled 2016-09-01: qty 50

## 2016-09-01 MED ORDER — CLINDAMYCIN HCL 300 MG PO CAPS: 300.0000 mg | ORAL_CAPSULE | Freq: Four times a day (QID) | ORAL | 0 refills | Status: DC

## 2016-09-01 MED ORDER — ACETAMINOPHEN 500 MG PO TABS
1000.0000 mg | ORAL_TABLET | Freq: Four times a day (QID) | ORAL | Status: DC | PRN
  Administered 2016-09-01: 1000 mg via ORAL
  Filled 2016-09-01: qty 2

## 2016-09-01 NOTE — ED Notes (Signed)
PT states understanding of care given, follow up care, and medication prescribed. PT ambulated from ED to car with a steady gait. 

## 2016-09-01 NOTE — ED Notes (Signed)
CHECKED CBG 238 RN BRIAN INFORMED

## 2016-09-01 NOTE — ED Notes (Signed)
Pt is aware he needs a urine sample 

## 2016-09-01 NOTE — ED Notes (Signed)
Attempted IV failed

## 2016-09-01 NOTE — ED Notes (Signed)
Patient transported to X-ray 

## 2016-09-01 NOTE — ED Provider Notes (Signed)
[facility] DEPT Provider Note   CSN: [id number] Arrival date & time: 09/01/16  [place]     History   Chief Complaint Chief Complaint  Patient presents with  . Hypertension  . Hyperglycemia    HPI Shane [name] is a 46 y.o. male.  HPI Patient states he's had several days of elevated blood sugars and blood pressure. This associated with lightheadedness and generalized headache. He also complains of right foot pain is worsening for last few days. No known injury. Denies any lower extremity swelling. Denies shortness of breath or chest pain. No focal weakness or numbness. States he is taking his medications as prescribed. No recent medication changes. Admits to nasal congestion and sinus pressure. Denies sore throat. Past Medical History:  Diagnosis Date  . Anemia   . Bipolar disorder ([place])   . Cellulitis and abscess of left leg 06/2016  . Chest pain    a. 2015 Reportedly normal stress test in [place].  Marland Kitchen Chronic diastolic CHF (congestive heart failure) (HCC)    a.03/2015 Echo: EF 55-60%, Gr 1 DD, mild MR, triv PR.  . Depression   . Depression with anxiety   . Dyspnea    with exertion  . GERD (gastroesophageal reflux disease)   . Hyperlipidemia   . Hypertension    a. 08/2014 Admitted with hypertensive urgency.  . Insomnia   . Internal carotid artery stenosis   . Lower GI bleed 07/04/2015  . Neuropathy ([place])   . Refusal of blood transfusions as patient is Jehovah's Witness   . TIA (transient ischemic attack) 08/2014; 03/2015   a. 08/2014 in setting of hypertensive urgency.  . Type II diabetes mellitus ([place])    started insulin spring 2016, Type II  . Vitamin D deficiency spring 2016    Patient Active Problem List   Diagnosis Date Noted  . Cellulitis of right foot 09/03/2016  . CKD (chronic kidney disease) stage 3, GFR 30-59 ml/min 08/27/2016  . Dyslipidemia associated with type 2 diabetes mellitus ([place]) 07/22/2016  . Constipation 07/02/2016  . Osteomyelitis of left foot ([place])  07/02/2016  . S/P transmetatarsal amputation of foot, left ([place]) 07/02/2016  . Dyspnea on exertion 07/02/2016  . Subacute osteomyelitis, left ankle and foot ([place]) 06/08/2016  . Diabetic gastroparesis ([place]) 05/30/2016  . Chronic ulcer of left foot ([place]) 05/22/2016  . Dermatitis 04/26/2016  . Buzzing in ear, right 03/26/2016  . Joint pain 03/11/2016  . Weakness 03/08/2016  . Dizziness 03/03/2016  . Diabetic ulcer of right foot associated with type 2 diabetes mellitus, with necrosis of muscle ([place]) 02/21/2016  . Chronic diastolic CHF (congestive heart failure), NYHA class 1 ([place]) 12/30/2015  . Diabetes mellitus type 2, uncontrolled, with complications ([place]) 95/18/8416  . Depression with anxiety 12/01/2015  . Pain in the chest 12/01/2015  . Insomnia 11/21/2015  . GERD (gastroesophageal reflux disease) 08/18/2015  . Abscess of third toe, left 08/01/2015  . Erectile dysfunction 07/04/2015  . Symptomatic anemia 04/24/2015  . AKI (acute kidney injury) ([place]) 04/24/2015  . Left arm weakness 04/02/2015  . Sensory disturbance 04/02/2015  . Essential hypertension 04/02/2015  . Hemispheric carotid artery syndrome   . HLD (hyperlipidemia)   . Headache     Past Surgical History:  Procedure Laterality Date  . AMPUTATION Left 08/03/2015   Procedure: AMPUTATION LEFT GREAT TOE;  Surgeon: Newt Minion, MD;  Location: [facility];  Service: Orthopedics;  Laterality: Left;  . AMPUTATION Left 12/02/2015   Procedure: amputation of left 2nd digit  .  AMPUTATION Left 04/10/2016   Procedure: Left 2nd Toe Amputation at MTP Joint;  Surgeon: Newt Minion, MD;  Location: [facility];  Service: Orthopedics;  Laterality: Left;  . AMPUTATION Left 06/09/2016   Procedure: AMPUTATION LEFT THIRD TOE;  Surgeon: [name], MD;  Location: [facility];  Service: Orthopedics;  Laterality: Left;  . AMPUTATION Left 06/26/2016   Procedure: Left Foot Transmetatarsal Amputation;  Surgeon: Newt Minion, MD;  Location: [facility];  Service:  Orthopedics;  Laterality: Left;  . APPLICATION OF WOUND VAC Left 12/19/2015   Procedure: APPLICATION OF WOUND VAC;  Surgeon: Meredith Pel, MD;  Location: [facility];  Service: Orthopedics;  Laterality: Left;  . CIRCUMCISION    . COLONOSCOPY N/A 01/22/2016   Procedure: COLONOSCOPY;  Surgeon: Gatha Mayer, MD;  Location: [facility];  Service: Endoscopy;  Laterality: N/A;  . ESOPHAGOGASTRODUODENOSCOPY N/A 01/19/2016   Procedure: ESOPHAGOGASTRODUODENOSCOPY (EGD);  Surgeon: Manus Gunning, MD;  Location: [facility];  Service: Gastroenterology;  Laterality: N/A;  . ESOPHAGOGASTRODUODENOSCOPY N/A 01/22/2016   Procedure: ESOPHAGOGASTRODUODENOSCOPY (EGD);  Surgeon: Gatha Mayer, MD;  Location: [facility] ENDOSCOPY;  Service: Endoscopy;  Laterality: N/A;  . I&D EXTREMITY Left 12/02/2015   Procedure: IRRIGATION AND DEBRIDEMENT OF FOOT; LEFT SECOND TOE AMPUTATION;  Surgeon: Meredith Pel, MD;  Location: [facility];  Service: Orthopedics;  Laterality: Left;  . I&D EXTREMITY Left 12/19/2015   Procedure: I & D LEFT FOOT WITH BEADS ;  Surgeon: Meredith Pel, MD;  Location: [facility];  Service: Orthopedics;  Laterality: Left;  . I&D EXTREMITY Right 01/17/2016   Procedure: IRRIGATION AND DEBRIDEMENT RIGHT FOOT;  Surgeon: Newt Minion, MD;  Location: [facility];  Service: Orthopedics;  Laterality: Right;  . INCISION AND DRAINAGE FOOT Right 01/17/2016  . INGUINAL HERNIA REPAIR Bilateral ~ 1983- ~ 1986  . TONSILLECTOMY  ~ 1985       Home Medications    Prior to Admission medications   Medication Sig Start Date End Date Taking? Authorizing Provider  ACCU-CHEK SOFTCLIX LANCETS lancets Use as instructed 07/22/16  Yes Boykin Nearing, MD  aspirin EC 81 MG tablet Take 1 tablet (81 mg total) by mouth daily. 03/25/16  Yes Josalyn Funches, MD  Blood Glucose Monitoring Suppl (ACCU-CHEK AVIVA PLUS) w/Device KIT Use as directed 08/06/16  Yes Josalyn Funches, MD  carvedilol (COREG) 12.5 MG tablet Take 12.5 mg by mouth 2 (two)  times daily.   Yes Historical Provider, MD  divalproex (DEPAKOTE) 500 MG DR tablet Take 2 tablets (1,000 mg total) by mouth at bedtime. 12/04/15  Yes Christina P Rama, MD  FLUoxetine (PROZAC) 20 MG tablet Take 1 tablet (20 mg total) by mouth every morning. 03/25/16  Yes Josalyn Funches, MD  folic acid (FOLVITE) 1 MG tablet Take 1 tablet (1 mg total) by mouth daily. 07/04/16  Yes Asencion Partridge, MD  furosemide (LASIX) 40 MG tablet Take 1 tablet (40 mg total) by mouth every other day. 08/06/16  Yes Josalyn Funches, MD  gabapentin (NEURONTIN) 100 MG capsule Take 2 capsules (200 mg total) by mouth 3 (three) times daily. Patient taking differently: Take 100 mg by mouth every morning.  01/23/16  Yes Hosie Poisson, MD  glucose blood (ACCU-CHEK AVIVA PLUS) test strip Use as instructed 08/08/16  Yes Josalyn Funches, MD  ibuprofen (ADVIL,MOTRIN) 800 MG tablet Take 800 mg by mouth every 8 (eight) hours as needed (for pain).   Yes Historical Provider, MD  Insulin Pen Needle (B-D ULTRAFINE III SHORT PEN) 31G  X 8 MM MISC 1 application by Does not apply route 3 (three) times daily. 08/02/16  Yes Boykin Nearing, MD  Lancet Devices [facility]) lancets Use as instructed 08/06/16  Yes Josalyn Funches, MD  lisinopril (PRINIVIL,ZESTRIL) 40 MG tablet Take 40 mg by mouth daily. 07/29/16  Yes Historical Provider, MD  metFORMIN (GLUCOPHAGE) 1000 MG tablet TAKE 1 TABLET BY MOUTH 2 TIMES DAILY WITH A MEAL. Patient taking differently: Take 1,000 mg by mouth in the morning 08/06/16  Yes Josalyn Funches, MD  metoCLOPramide (REGLAN) 5 MG tablet Take 1 tablet (5 mg total) by mouth 3 (three) times daily before meals. 05/30/16  Yes Josalyn Funches, MD  ondansetron (ZOFRAN ODT) 8 MG disintegrating tablet Take 1 tablet (8 mg total) by mouth every 8 (eight) hours as needed for nausea or vomiting. 02/26/16  Yes Josalyn Funches, MD  pantoprazole (PROTONIX) 40 MG tablet Take 40 mg by mouth daily.  04/23/16  Yes Historical Provider, MD    polyethylene glycol (MIRALAX / GLYCOLAX) packet Take 17 g by mouth daily as needed for moderate constipation. 07/03/16  Yes Asencion Partridge, MD  ranitidine (ZANTAC) 300 MG tablet Take 1 tablet (300 mg total) by mouth at bedtime. 08/23/16  Yes Josalyn Funches, MD  senna-docusate (SENOKOT-S) 8.6-50 MG tablet Take 1 tablet by mouth at bedtime. Patient taking differently: Take 1 tablet by mouth at bedtime as needed for mild constipation.  07/03/16  Yes Asencion Partridge, MD  traZODone (DESYREL) 100 MG tablet Take 1 tablet (100 mg total) by mouth at bedtime. 02/26/16  Yes Josalyn Funches, MD  Vitamin D, Ergocalciferol, (DRISDOL) 50000 units CAPS capsule Take 1 capsule (50,000 Units total) by mouth every 30 (thirty) days. Every 30 days Patient taking differently: Take 50,000 Units by mouth every 30 (thirty) days.  04/26/16  Yes Josalyn Funches, MD  atorvastatin (LIPITOR) 80 MG tablet TAKE 1 TABLET BY MOUTH EVERY MORNING. 09/02/16   Boykin Nearing, MD  clindamycin (CLEOCIN) 300 MG capsule Take 1 capsule (300 mg total) by mouth 4 (four) times daily. X 7 days 09/03/16   Boykin Nearing, MD  exenatide (BYETTA 10 MCG PEN) 10 MCG/0.04ML SOPN injection Inject 0.04 mLs (10 mcg total) into the skin 2 (two) times daily with a meal. 09/03/16   Josalyn Funches, MD  fluticasone (FLONASE) 50 MCG/ACT nasal spray Place 2 sprays into both nostrils daily. 09/01/16   Julianne Rice, MD  glipiZIDE (GLUCOTROL XL) 10 MG 24 hr tablet Take 2 tablets (20 mg total) by mouth daily with breakfast. 09/03/16   Boykin Nearing, MD  Insulin Glargine (LANTUS SOLOSTAR) 100 UNIT/ML Solostar Pen Inject 20 Units into the skin every morning. 09/03/16   Josalyn Funches, MD  loratadine (CLARITIN) 10 MG tablet Take 1 tablet (10 mg total) by mouth daily. 09/01/16   Julianne Rice, MD  vitamin B-12 1000 MCG tablet Take 1 tablet (1,000 mcg total) by mouth daily. Patient not taking: Reported on 09/01/2016 07/04/16   Asencion Partridge, MD    Family History Family  History  Problem Relation Age of Onset  . Hypertension Mother   . Diabetes Mother   . Hyperlipidemia Mother   . Heart disease Mother     s/p pacemaker  . Diabetes Father   . Hypertension Father   . Stroke Father   . Heart attack Father     first MI @ 93.  . Stroke Brother     Social History Social History  Substance Use Topics  . Smoking status: Never Smoker  .  Smokeless tobacco: Never Used  . Alcohol use No     Allergies   Lactose intolerance (gi); Other; and Milk-related compounds   Review of Systems Review of Systems  Constitutional: Positive for fatigue. Negative for chills and fever.  HENT: Positive for congestion, sinus pain and sinus pressure. Negative for sore throat.   Eyes: Negative for visual disturbance.  Respiratory: Negative for cough, shortness of breath and wheezing.   Cardiovascular: Negative for chest pain and leg swelling.  Gastrointestinal: Negative for abdominal pain, diarrhea, nausea and vomiting.  Genitourinary: Positive for frequency. Negative for difficulty urinating, dysuria and flank pain.  Musculoskeletal: Positive for arthralgias. Negative for back pain, myalgias, neck pain and neck stiffness.  Skin: Negative for rash and wound.  Neurological: Positive for dizziness, light-headedness and headaches. Negative for syncope, weakness and numbness.  All other systems reviewed and are negative.    Physical Exam Updated Vital Signs BP (!) 143/97   Pulse 88   Temp 97.8 F (36.6 C) (Oral)   Resp (!) 23   Ht 5' 7" (1.702 m)   Wt 190 lb (86.2 kg)   SpO2 96%   BMI 29.76 kg/m   Physical Exam  Constitutional: He is oriented to person, place, and time. He appears well-developed and well-nourished. No distress.  HENT:  Head: Normocephalic and atraumatic.  Mouth/Throat: Oropharynx is clear and moist.  Bilateral nasal mucosal edema. Patient has some diffuse sinus tenderness to percussion. Oropharynx is clear.  Eyes: EOM are normal. Pupils are  equal, round, and reactive to light.  Neck: Normal range of motion. Neck supple.  Cardiovascular: Normal rate and regular rhythm.  Exam reveals no gallop and no friction rub.   No murmur heard. Pulmonary/Chest: Effort normal and breath sounds normal. No respiratory distress. He has no wheezes. He has no rales. He exhibits no tenderness.  Abdominal: Soft. Bowel sounds are normal. There is no tenderness. There is no rebound and no guarding.  Musculoskeletal: Normal range of motion. He exhibits tenderness. He exhibits no edema.  Patient has a well-healed scar to the dorsum of the right foot. Mild tenderness overlying the site. There is no obvious erythema or swelling or warmth. No ulcerations present. No calf swelling or tenderness. Left foot amputation to the midfoot.  Neurological: He is alert and oriented to person, place, and time.  Moving all extremities without deficit. Sensation fully intact.  Skin: Skin is warm and dry. Capillary refill takes less than 2 seconds. No rash noted. No erythema.  Psychiatric: He has a normal mood and affect. His behavior is normal.  Nursing note and vitals reviewed.    ED Treatments / Results  Labs (all labs ordered are listed, but only abnormal results are displayed) Labs Reviewed  BASIC METABOLIC PANEL - Abnormal; Notable for the following:       Result Value   Chloride 99 (*)    Glucose, Bld 243 (*)    BUN 22 (*)    Creatinine, Ser 1.54 (*)    Calcium 8.8 (*)    GFR calc non Af Amer 52 (*)    All other components within normal limits  CBC - Abnormal; Notable for the following:    RBC 3.41 (*)    Hemoglobin 10.5 (*)    HCT 32.3 (*)    All other components within normal limits  URINALYSIS, ROUTINE W REFLEX MICROSCOPIC - Abnormal; Notable for the following:    Color, Urine STRAW (*)    Glucose, UA >=500 (*)  Hgb urine dipstick SMALL (*)    Protein, ur 100 (*)    Bacteria, UA RARE (*)    All other components within normal limits  CBG  MONITORING, ED - Abnormal; Notable for the following:    Glucose-Capillary 224 (*)    All other components within normal limits  CBG MONITORING, ED - Abnormal; Notable for the following:    Glucose-Capillary 238 (*)    All other components within normal limits    EKG  EKG Interpretation  Date/Time:  Sunday September 01 2016 20:29:20 EDT Ventricular Rate:  85 PR Interval:    QRS Duration: 90 QT Interval:  367 QTC Calculation: 437 R Axis:   70 Text Interpretation:  Sinus rhythm ST elev, probable normal early repol pattern Confirmed by Christy Gentles  MD, DONALD ([id number]) on 09/02/2016 10:29:14 AM       Radiology No results found.  Procedures Procedures (including critical care time)  Medications Ordered in ED Medications  sodium chloride 0.9 % bolus 1,000 mL (0 mLs Intravenous Stopped 09/01/16 2243)  clindamycin (CLEOCIN) IVPB 600 mg (0 mg Intravenous Stopped 09/01/16 2243)  hydrALAZINE (APRESOLINE) injection 10 mg (10 mg Intravenous Given 09/01/16 2157)     Initial Impression / Assessment and Plan / ED Course  I have reviewed the triage vital signs and the nursing notes.  Pertinent labs & imaging results that were available during my care of the patient were reviewed by me and considered in my medical decision making (see chart for details).    Patient does have some tenderness to the base of the fifth digit of the right foot which corresponds to the swelling on x-ray. This may be the start of early cellulitis. We'll give IV dose of antibiotics and will need close follow-up as an outpatient.   Final Clinical Impressions(s) / ED Diagnoses   Final diagnoses:  Hyperglycemia  Transient elevated blood pressure  Nasal sinus congestion  Toe pain, right    New Prescriptions Discharge Medication List as of 09/01/2016 10:57 PM    START taking these medications   Details  fluticasone (FLONASE) 50 MCG/ACT nasal spray Place 2 sprays into both nostrils daily., Starting Sun 09/01/2016,  Print    loratadine (CLARITIN) 10 MG tablet Take 1 tablet (10 mg total) by mouth daily., Starting Sun 09/01/2016, Print    clindamycin (CLEOCIN) 300 MG capsule Take 1 capsule (300 mg total) by mouth 4 (four) times daily. X 7 days, Starting Sun 09/01/2016, Print         Julianne Rice, MD 09/04/16 2159

## 2016-09-01 NOTE — ED Triage Notes (Signed)
Pt c/o high blood pressure and high blood sugar onset last night. Pt BP reading was 154/100, CBG was 391 at home. Pt took 18 Units lantus PTA and took his blood pressure medications this morning.

## 2016-09-02 ENCOUNTER — Other Ambulatory Visit: Payer: Self-pay | Admitting: Family Medicine

## 2016-09-02 MED FILL — LISINOPRIL 40 MG TABLET: 40 | 30 days supply | Qty: 30 | Fill #2

## 2016-09-02 MED FILL — ATORVASTATIN 80 MG TABLET: 80 | 30 days supply | Qty: 30 | Fill #0

## 2016-09-02 MED FILL — glipiZIDE ER 10 MG TB24: 10 | 30 days supply | Qty: 30 | Fill #4

## 2016-09-02 MED FILL — ?AMLODIPINE BESYLATE 5 MG T: 5 | 30 days supply | Qty: 30 | Fill #8

## 2016-09-02 MED FILL — ?PANTOPRAZOLE SOD DR 40MG: 40 MG | 30 days supply | Qty: 30 | Fill #5

## 2016-09-03 ENCOUNTER — Ambulatory Visit: Payer: Self-pay | Attending: Family Medicine | Admitting: Family Medicine

## 2016-09-03 ENCOUNTER — Encounter: Payer: Self-pay | Admitting: Family Medicine

## 2016-09-03 VITALS — BP 120/86 | HR 85 | Temp 97.9°F | Ht 67.0 in | Wt 197.2 lb

## 2016-09-03 DIAGNOSIS — I10 Essential (primary) hypertension: Secondary | ICD-10-CM | POA: Insufficient documentation

## 2016-09-03 DIAGNOSIS — L03115 Cellulitis of right lower limb: Secondary | ICD-10-CM | POA: Insufficient documentation

## 2016-09-03 DIAGNOSIS — IMO0002 Reserved for concepts with insufficient information to code with codable children: Secondary | ICD-10-CM

## 2016-09-03 DIAGNOSIS — D649 Anemia, unspecified: Secondary | ICD-10-CM | POA: Insufficient documentation

## 2016-09-03 DIAGNOSIS — F329 Major depressive disorder, single episode, unspecified: Secondary | ICD-10-CM | POA: Insufficient documentation

## 2016-09-03 DIAGNOSIS — Z794 Long term (current) use of insulin: Secondary | ICD-10-CM | POA: Insufficient documentation

## 2016-09-03 DIAGNOSIS — Z79899 Other long term (current) drug therapy: Secondary | ICD-10-CM | POA: Insufficient documentation

## 2016-09-03 DIAGNOSIS — E1165 Type 2 diabetes mellitus with hyperglycemia: Secondary | ICD-10-CM

## 2016-09-03 DIAGNOSIS — F419 Anxiety disorder, unspecified: Secondary | ICD-10-CM | POA: Insufficient documentation

## 2016-09-03 DIAGNOSIS — E118 Type 2 diabetes mellitus with unspecified complications: Secondary | ICD-10-CM | POA: Insufficient documentation

## 2016-09-03 DIAGNOSIS — Z7982 Long term (current) use of aspirin: Secondary | ICD-10-CM | POA: Insufficient documentation

## 2016-09-03 LAB — GLUCOSE, POCT (MANUAL RESULT ENTRY)
POC GLUCOSE: 374 mg/dL — AB (ref 70–99)
POC GLUCOSE: 160 mg/dL — AB (ref 70–99)

## 2016-09-03 MED ORDER — INSULIN GLARGINE 100 UNIT/ML SOLOSTAR PEN: 20.0000 [IU] | PEN_INJECTOR | SUBCUTANEOUS | 11 refills | Status: DC

## 2016-09-03 MED ORDER — CLINDAMYCIN HCL 300 MG PO CAPS: 300.0000 mg | ORAL_CAPSULE | Freq: Four times a day (QID) | ORAL | 0 refills | Status: DC

## 2016-09-03 MED ORDER — GLIPIZIDE ER 10 MG PO TB24: 20.0000 mg | ORAL_TABLET | Freq: Every day | ORAL | 5 refills | Status: DC

## 2016-09-03 MED ORDER — EXENATIDE 10 MCG/0.04ML ~~LOC~~ SOPN: 10.0000 ug | PEN_INJECTOR | Freq: Two times a day (BID) | SUBCUTANEOUS | 5 refills | Status: DC

## 2016-09-03 MED ORDER — INSULIN ASPART 100 UNIT/ML ~~LOC~~ SOLN
20.0000 [IU] | Freq: Once | SUBCUTANEOUS | Status: AC
  Administered 2016-09-03: 20 [IU] via SUBCUTANEOUS

## 2016-09-03 NOTE — Patient Instructions (Addendum)
Shane [name] was seen today for hospitalization follow-up.  Diagnoses and all orders for this visit:  Uncontrolled type 2 diabetes mellitus with complication, with long-term current use of insulin (HCC) -     POCT glucose (manual entry) -     insulin aspart (novoLOG) injection 20 Units; Inject 0.2 mLs (20 Units total) into the skin once. -     exenatide (BYETTA 10 MCG PEN) 10 MCG/0.04ML SOPN injection; Inject 0.04 mLs (10 mcg total) into the skin 2 (two) times daily with a meal. -     glipiZIDE (GLUCOTROL XL) 10 MG 24 hr tablet; Take 2 tablets (20 mg total) by mouth daily with breakfast. -     Insulin Glargine (LANTUS SOLOSTAR) 100 UNIT/ML Solostar Pen; Inject 20 Units into the skin every morning.  Cellulitis of right foot -     Discontinue: clindamycin (CLEOCIN) 300 MG capsule; Take 1 capsule (300 mg total) by mouth 4 (four) times daily. X 7 days -     clindamycin (CLEOCIN) 300 MG capsule; Take 1 capsule (300 mg total) by mouth 4 (four) times daily. X 7 days   Shane [name], we investigated the problem with the byetta coverage. We were informed that you no longer have medicaid coverage.  I have sent byetta and clindmaycin to onsite pharmacy  For now, please increase glipizide to 20 mg daily and lantus to 20 mg daily  f/u in 2 weeks for recheck of R foot cellulitis  Dr. Adrian Blackwater

## 2016-09-03 NOTE — Assessment & Plan Note (Signed)
A: diabetes with hyperglycemia P: increase lantus to 20 U daily Increase glipizide to 20 mg daily Sent byetta to onsite pharmacy

## 2016-09-03 NOTE — Progress Notes (Signed)
Pt is here for a hospital follow up. Pt is having pain in his right pinky toe.

## 2016-09-03 NOTE — Progress Notes (Signed)
Subjective:  Patient ID: Shane [name], male    DOB: 05-24-1971  Age: 46 y.o. MRN: [id number]  CC: Hospitalization Follow-up   HPI Shane [name] has diabetes with complications,  Anemia, HTN, depression and anxiety, chronic diastolic dysfunction he presents for  presents for   1. CHRONIC DIABETES, he was seen in ED on 09/01/2016 for hyperglycemia with CBG of 286. His CBG was 391 prior to going to the ED.   Disease Monitoring  Blood Sugar Ranges:   Fasting: 96-112  Postprandial: 41-150  Polyuria: yes   Visual problems: yes   Medication Compliance: no,taking lantus increase to 15 U, has not had byetta 10 mcg BID (investigated reasons, patient's medicaid has lapsed)  Medication Side Effects  Hypoglycemia:   Preventitive Health Care  Eye Exam: due   2. R foot pain: started 2 days ago. Pain with swelling. X-ray revealed soft tissue swelling. He has history of abscess in his R foot s/p  I&D. He was prescribed clindamycin but has not started it.    Social History  Substance Use Topics  . Smoking status: Never Smoker  . Smokeless tobacco: Never Used  . Alcohol use No    Outpatient Medications Prior to Visit  Medication Sig Dispense Refill  . ACCU-CHEK SOFTCLIX LANCETS lancets Use as instructed 100 each 12  . aspirin EC 81 MG tablet Take 1 tablet (81 mg total) by mouth daily. 30 tablet 11  . atorvastatin (LIPITOR) 80 MG tablet TAKE 1 TABLET BY MOUTH EVERY MORNING. 30 tablet 5  . Blood Glucose Monitoring Suppl (ACCU-CHEK AVIVA PLUS) w/Device KIT Use as directed 1 kit 0  . carvedilol (COREG) 12.5 MG tablet Take 12.5 mg by mouth 2 (two) times daily.    . clindamycin (CLEOCIN) 300 MG capsule Take 1 capsule (300 mg total) by mouth 4 (four) times daily. X 7 days 28 capsule 0  . divalproex (DEPAKOTE) 500 MG DR tablet Take 2 tablets (1,000 mg total) by mouth at bedtime. 30 tablet 5  . exenatide (BYETTA 5 MCG PEN) 5 MCG/0.02ML SOPN injection Inject 0.02 mLs (5 mcg total) into the skin 2  (two) times daily with a meal. 1.2 mL 2  . FLUoxetine (PROZAC) 20 MG tablet Take 1 tablet (20 mg total) by mouth every morning. 30 tablet 5  . fluticasone (FLONASE) 50 MCG/ACT nasal spray Place 2 sprays into both nostrils daily. 16 g 0  . folic acid (FOLVITE) 1 MG tablet Take 1 tablet (1 mg total) by mouth daily. 30 tablet 3  . furosemide (LASIX) 40 MG tablet Take 1 tablet (40 mg total) by mouth every other day. 30 tablet 1  . gabapentin (NEURONTIN) 100 MG capsule Take 2 capsules (200 mg total) by mouth 3 (three) times daily. (Patient taking differently: Take 100 mg by mouth every morning. ) 90 capsule 0  . glipiZIDE (GLUCOTROL XL) 10 MG 24 hr tablet Take 10 mg by mouth daily with breakfast.    . glucose blood (ACCU-CHEK AVIVA PLUS) test strip Use as instructed 100 each 12  . ibuprofen (ADVIL,MOTRIN) 800 MG tablet Take 800 mg by mouth every 8 (eight) hours as needed (for pain).    . Insulin Glargine (LANTUS SOLOSTAR) 100 UNIT/ML Solostar Pen Inject 10 Units into the skin every morning. (Patient taking differently: Inject 18 Units into the skin at bedtime. ) 3 pen 11  . Insulin Pen Needle (B-D ULTRAFINE III SHORT PEN) 31G X 8 MM MISC 1 application by Does not apply route 3 (three)  times daily. 90 each 11  . Lancet Devices (ACCU-CHEK SOFTCLIX) lancets Use as instructed 1 each 0  . lisinopril (PRINIVIL,ZESTRIL) 40 MG tablet Take 40 mg by mouth daily.  2  . loratadine (CLARITIN) 10 MG tablet Take 1 tablet (10 mg total) by mouth daily. 30 tablet 0  . metFORMIN (GLUCOPHAGE) 1000 MG tablet TAKE 1 TABLET BY MOUTH 2 TIMES DAILY WITH A MEAL. (Patient taking differently: Take 1,000 mg by mouth in the morning) 60 tablet 0  . metoCLOPramide (REGLAN) 5 MG tablet Take 1 tablet (5 mg total) by mouth 3 (three) times daily before meals. 90 tablet 2  . ondansetron (ZOFRAN ODT) 8 MG disintegrating tablet Take 1 tablet (8 mg total) by mouth every 8 (eight) hours as needed for nausea or vomiting. 30 tablet 0  .  pantoprazole (PROTONIX) 40 MG tablet Take 40 mg by mouth daily.   5  . polyethylene glycol (MIRALAX / GLYCOLAX) packet Take 17 g by mouth daily as needed for moderate constipation. 14 each 0  . ranitidine (ZANTAC) 300 MG tablet Take 1 tablet (300 mg total) by mouth at bedtime. 30 tablet 5  . senna-docusate (SENOKOT-S) 8.6-50 MG tablet Take 1 tablet by mouth at bedtime. (Patient taking differently: Take 1 tablet by mouth at bedtime as needed for mild constipation. ) 60 tablet 2  . traZODone (DESYREL) 100 MG tablet Take 1 tablet (100 mg total) by mouth at bedtime. 30 tablet 5  . Vitamin D, Ergocalciferol, (DRISDOL) 50000 units CAPS capsule Take 1 capsule (50,000 Units total) by mouth every 30 (thirty) days. Every 30 days (Patient taking differently: Take 50,000 Units by mouth every 30 (thirty) days. ) 12 capsule 1  . carvedilol (COREG) 25 MG tablet Take 1 tablet (25 mg total) by mouth 2 (two) times daily with a meal. (Patient not taking: Reported on 09/01/2016) 60 tablet 5  . exenatide (BYETTA 10 MCG PEN) 10 MCG/0.04ML SOPN injection Inject 0.04 mLs (10 mcg total) into the skin 2 (two) times daily with a meal. (Patient not taking: Reported on 09/01/2016) 2.4 mL 5  . oxyCODONE-acetaminophen (PERCOCET/ROXICET) 5-325 MG tablet Take 1-2 tablets by mouth every 8 (eight) hours as needed for moderate pain or severe pain. Do not exceed 3gm of APAP from all sources/24hrs (Patient not taking: Reported on 09/01/2016) 180 tablet 0  . vitamin B-12 1000 MCG tablet Take 1 tablet (1,000 mcg total) by mouth daily. (Patient not taking: Reported on 09/01/2016) 30 tablet 3   No facility-administered medications prior to visit.     ROS Review of Systems  Constitutional: Negative for chills, fatigue, fever and unexpected weight change.  Eyes: Negative for visual disturbance.  Respiratory: Negative for cough and shortness of breath.   Cardiovascular: Negative for chest pain, palpitations and leg swelling.  Gastrointestinal:  Negative for abdominal pain, blood in stool, constipation, diarrhea, nausea and vomiting.  Endocrine: Negative for polydipsia, polyphagia and polyuria.  Musculoskeletal: Positive for arthralgias. Negative for back pain, gait problem, myalgias and neck pain.  Skin: Negative for rash.  Allergic/Immunologic: Negative for immunocompromised state.  Hematological: Negative for adenopathy. Does not bruise/bleed easily.  Psychiatric/Behavioral: Negative for dysphoric mood, sleep disturbance and suicidal ideas. The patient is not nervous/anxious.     Objective:  BP (!) 147/96   Pulse 85   Temp 97.9 F (36.6 C) (Oral)   Ht 5' 7"  (1.702 m)   Wt 197 lb 3.2 oz (89.4 kg)   SpO2 99%   BMI 30.89 kg/m  BP/Weight 09/03/2016 07/29/9415 4/0/8144  Systolic BP 818 563 149  Diastolic BP 96 97 80  Wt. (Lbs) 197.2 190 195.8  BMI 30.89 29.76 30.67    Physical Exam  Constitutional: He appears well-developed and well-nourished. No distress.  HENT:  Head: Normocephalic and atraumatic.  Neck: Normal range of motion. Neck supple.  Cardiovascular: Normal rate, regular rhythm, normal heart sounds and intact distal pulses.   Pulmonary/Chest: Effort normal and breath sounds normal.  Musculoskeletal: He exhibits no edema.       Feet:  Neurological: He is alert.  Skin: Skin is warm and dry. No rash noted. No erythema.  Psychiatric: He has a normal mood and affect.   Lab Results  Component Value Date   HGBA1C 7.6 08/23/2016   CBG 331  Treated with 10 U of novolog  Repeat CBG   Assessment & Plan:   Shane [name] was seen today for hospitalization follow-up.  Diagnoses and all orders for this visit:  Uncontrolled type 2 diabetes mellitus with complication, with long-term current use of insulin (HCC) -     POCT glucose (manual entry) -     insulin aspart (novoLOG) injection 20 Units; Inject 0.2 mLs (20 Units total) into the skin once. -     exenatide (BYETTA 10 MCG PEN) 10 MCG/0.04ML SOPN injection; Inject  0.04 mLs (10 mcg total) into the skin 2 (two) times daily with a meal. -     glipiZIDE (GLUCOTROL XL) 10 MG 24 hr tablet; Take 2 tablets (20 mg total) by mouth daily with breakfast. -     Insulin Glargine (LANTUS SOLOSTAR) 100 UNIT/ML Solostar Pen; Inject 20 Units into the skin every morning.  Cellulitis of right foot -     Discontinue: clindamycin (CLEOCIN) 300 MG capsule; Take 1 capsule (300 mg total) by mouth 4 (four) times daily. X 7 days -     clindamycin (CLEOCIN) 300 MG capsule; Take 1 capsule (300 mg total) by mouth 4 (four) times daily. X 7 days    Meds ordered this encounter  Medications  . insulin aspart (novoLOG) injection 20 Units  . exenatide (BYETTA 10 MCG PEN) 10 MCG/0.04ML SOPN injection    Sig: Inject 0.04 mLs (10 mcg total) into the skin 2 (two) times daily with a meal.    Dispense:  2.4 mL    Refill:  5  . glipiZIDE (GLUCOTROL XL) 10 MG 24 hr tablet    Sig: Take 2 tablets (20 mg total) by mouth daily with breakfast.    Dispense:  60 tablet    Refill:  5  . Insulin Glargine (LANTUS SOLOSTAR) 100 UNIT/ML Solostar Pen    Sig: Inject 20 Units into the skin every morning.    Dispense:  3 pen    Refill:  11  . DISCONTD: clindamycin (CLEOCIN) 300 MG capsule    Sig: Take 1 capsule (300 mg total) by mouth 4 (four) times daily. X 7 days    Dispense:  28 capsule    Refill:  0  . clindamycin (CLEOCIN) 300 MG capsule    Sig: Take 1 capsule (300 mg total) by mouth 4 (four) times daily. X 7 days    Dispense:  28 capsule    Refill:  0    Follow-up: Return in about 2 weeks (around 09/17/2016) for R foot cellulitis .   Boykin Nearing MD

## 2016-09-03 NOTE — Addendum Note (Signed)
Addended by: Gomez Cleverly on: 09/03/2016 03:44 PM   Modules accepted: Orders

## 2016-09-03 NOTE — Assessment & Plan Note (Signed)
Patient prescribed clindamycin Advised to start No abscess

## 2016-09-05 ENCOUNTER — Ambulatory Visit: Payer: Medicaid Other | Attending: Family Medicine

## 2016-09-05 ENCOUNTER — Other Ambulatory Visit: Payer: Self-pay | Admitting: Pharmacist

## 2016-09-05 MED ORDER — INSULIN DETEMIR 100 UNIT/ML FLEXPEN: 20.0000 [IU] | PEN_INJECTOR | Freq: Every morning | SUBCUTANEOUS | 0 refills | Status: DC

## 2016-09-16 ENCOUNTER — Ambulatory Visit: Payer: Self-pay | Attending: Family Medicine | Admitting: Family Medicine

## 2016-09-16 ENCOUNTER — Encounter: Payer: Self-pay | Admitting: Family Medicine

## 2016-09-16 VITALS — BP 125/84 | HR 86 | Temp 97.8°F | Ht 67.0 in | Wt 195.2 lb

## 2016-09-16 DIAGNOSIS — M79661 Pain in right lower leg: Secondary | ICD-10-CM | POA: Insufficient documentation

## 2016-09-16 DIAGNOSIS — I10 Essential (primary) hypertension: Secondary | ICD-10-CM | POA: Insufficient documentation

## 2016-09-16 DIAGNOSIS — Z794 Long term (current) use of insulin: Secondary | ICD-10-CM | POA: Insufficient documentation

## 2016-09-16 DIAGNOSIS — L03115 Cellulitis of right lower limb: Secondary | ICD-10-CM

## 2016-09-16 DIAGNOSIS — Z79899 Other long term (current) drug therapy: Secondary | ICD-10-CM | POA: Insufficient documentation

## 2016-09-16 DIAGNOSIS — M79662 Pain in left lower leg: Secondary | ICD-10-CM

## 2016-09-16 DIAGNOSIS — F329 Major depressive disorder, single episode, unspecified: Secondary | ICD-10-CM | POA: Insufficient documentation

## 2016-09-16 DIAGNOSIS — E1165 Type 2 diabetes mellitus with hyperglycemia: Secondary | ICD-10-CM

## 2016-09-16 DIAGNOSIS — F419 Anxiety disorder, unspecified: Secondary | ICD-10-CM | POA: Insufficient documentation

## 2016-09-16 DIAGNOSIS — Z7982 Long term (current) use of aspirin: Secondary | ICD-10-CM | POA: Insufficient documentation

## 2016-09-16 DIAGNOSIS — E118 Type 2 diabetes mellitus with unspecified complications: Secondary | ICD-10-CM | POA: Insufficient documentation

## 2016-09-16 DIAGNOSIS — M7989 Other specified soft tissue disorders: Secondary | ICD-10-CM | POA: Insufficient documentation

## 2016-09-16 DIAGNOSIS — IMO0002 Reserved for concepts with insufficient information to code with codable children: Secondary | ICD-10-CM

## 2016-09-16 DIAGNOSIS — D649 Anemia, unspecified: Secondary | ICD-10-CM | POA: Insufficient documentation

## 2016-09-16 DIAGNOSIS — M79671 Pain in right foot: Secondary | ICD-10-CM | POA: Insufficient documentation

## 2016-09-16 LAB — GLUCOSE, POCT (MANUAL RESULT ENTRY): POC GLUCOSE: 222 mg/dL — AB (ref 70–99)

## 2016-09-16 MED ORDER — INSULIN DETEMIR 100 UNIT/ML FLEXPEN: 24.0000 [IU] | PEN_INJECTOR | Freq: Every morning | SUBCUTANEOUS | 0 refills | Status: DC

## 2016-09-16 MED ORDER — ASPIRIN EC 81 MG PO TBEC: 81.0000 mg | DELAYED_RELEASE_TABLET | Freq: Every day | ORAL | 11 refills | Status: AC

## 2016-09-16 NOTE — Progress Notes (Signed)
Subjective:  Patient ID: Shane [name], male    DOB: 12-Apr-1971  Age: 46 y.o. MRN: [id number]  CC: Diabetes and Cellulitis   HPI Tigran Haynie has diabetes with complications, s/p L transmetatarsal amputation, anemia, HTN, depression and anxiety, chronic diastolic dysfunction he presents for  presents for   1. CHRONIC DIABETES Disease Monitoring  Blood Sugar Ranges:   Fasting: 159   Postprandial: 41-150  Polyuria: yes   Visual problems: yes   Medication Compliance: yes ,taking levemir increase to 20 U,  Medication Side Effects  Hypoglycemia: no, lowest CBG 90   Preventitive Health Care  Eye Exam: due   2. R foot cellulitis: started 2 days ago. Pain with swelling. X-ray revealed soft tissue swelling. He has history of abscess in his R foot s/p  I&D. He has completed course of clindamycin. He still has pain in R lateral foot along 5th metatarsal. No wounds. No drainage. Slight swelling.    Social History  Substance Use Topics  . Smoking status: Never Smoker  . Smokeless tobacco: Never Used  . Alcohol use No    Outpatient Medications Prior to Visit  Medication Sig Dispense Refill  . ACCU-CHEK SOFTCLIX LANCETS lancets Use as instructed 100 each 12  . aspirin EC 81 MG tablet Take 1 tablet (81 mg total) by mouth daily. 30 tablet 11  . atorvastatin (LIPITOR) 80 MG tablet TAKE 1 TABLET BY MOUTH EVERY MORNING. 30 tablet 5  . Blood Glucose Monitoring Suppl (ACCU-CHEK AVIVA PLUS) w/Device KIT Use as directed 1 kit 0  . carvedilol (COREG) 12.5 MG tablet Take 12.5 mg by mouth 2 (two) times daily.    . clindamycin (CLEOCIN) 300 MG capsule Take 1 capsule (300 mg total) by mouth 4 (four) times daily. X 7 days 28 capsule 0  . divalproex (DEPAKOTE) 500 MG DR tablet Take 2 tablets (1,000 mg total) by mouth at bedtime. 30 tablet 5  . exenatide (BYETTA 10 MCG PEN) 10 MCG/0.04ML SOPN injection Inject 0.04 mLs (10 mcg total) into the skin 2 (two) times daily with a meal. 2.4 mL 5  . FLUoxetine  (PROZAC) 20 MG tablet Take 1 tablet (20 mg total) by mouth every morning. 30 tablet 5  . fluticasone (FLONASE) 50 MCG/ACT nasal spray Place 2 sprays into both nostrils daily. 16 g 0  . folic acid (FOLVITE) 1 MG tablet Take 1 tablet (1 mg total) by mouth daily. 30 tablet 3  . furosemide (LASIX) 40 MG tablet Take 1 tablet (40 mg total) by mouth every other day. 30 tablet 1  . gabapentin (NEURONTIN) 100 MG capsule Take 2 capsules (200 mg total) by mouth 3 (three) times daily. (Patient taking differently: Take 100 mg by mouth every morning. ) 90 capsule 0  . glipiZIDE (GLUCOTROL XL) 10 MG 24 hr tablet Take 2 tablets (20 mg total) by mouth daily with breakfast. 60 tablet 5  . glucose blood (ACCU-CHEK AVIVA PLUS) test strip Use as instructed 100 each 12  . ibuprofen (ADVIL,MOTRIN) 800 MG tablet Take 800 mg by mouth every 8 (eight) hours as needed (for pain).    . Insulin Detemir (LEVEMIR FLEXPEN) 100 UNIT/ML Pen Inject 20 Units into the skin every morning. 6 mL 0  . Insulin Glargine (LANTUS SOLOSTAR) 100 UNIT/ML Solostar Pen Inject 20 Units into the skin every morning. 3 pen 11  . Insulin Pen Needle (B-D ULTRAFINE III SHORT PEN) 31G X 8 MM MISC 1 application by Does not apply route 3 (three) times daily.  90 each 11  . Lancet Devices (ACCU-CHEK SOFTCLIX) lancets Use as instructed 1 each 0  . lisinopril (PRINIVIL,ZESTRIL) 40 MG tablet Take 40 mg by mouth daily.  2  . loratadine (CLARITIN) 10 MG tablet Take 1 tablet (10 mg total) by mouth daily. 30 tablet 0  . metFORMIN (GLUCOPHAGE) 1000 MG tablet TAKE 1 TABLET BY MOUTH 2 TIMES DAILY WITH A MEAL. (Patient taking differently: Take 1,000 mg by mouth in the morning) 60 tablet 0  . metoCLOPramide (REGLAN) 5 MG tablet Take 1 tablet (5 mg total) by mouth 3 (three) times daily before meals. 90 tablet 2  . ondansetron (ZOFRAN ODT) 8 MG disintegrating tablet Take 1 tablet (8 mg total) by mouth every 8 (eight) hours as needed for nausea or vomiting. 30 tablet 0  .  pantoprazole (PROTONIX) 40 MG tablet Take 40 mg by mouth daily.   5  . polyethylene glycol (MIRALAX / GLYCOLAX) packet Take 17 g by mouth daily as needed for moderate constipation. 14 each 0  . ranitidine (ZANTAC) 300 MG tablet Take 1 tablet (300 mg total) by mouth at bedtime. 30 tablet 5  . senna-docusate (SENOKOT-S) 8.6-50 MG tablet Take 1 tablet by mouth at bedtime. (Patient taking differently: Take 1 tablet by mouth at bedtime as needed for mild constipation. ) 60 tablet 2  . traZODone (DESYREL) 100 MG tablet Take 1 tablet (100 mg total) by mouth at bedtime. 30 tablet 5  . Vitamin D, Ergocalciferol, (DRISDOL) 50000 units CAPS capsule Take 1 capsule (50,000 Units total) by mouth every 30 (thirty) days. Every 30 days (Patient taking differently: Take 50,000 Units by mouth every 30 (thirty) days. ) 12 capsule 1  . vitamin B-12 1000 MCG tablet Take 1 tablet (1,000 mcg total) by mouth daily. (Patient not taking: Reported on 09/01/2016) 30 tablet 3   No facility-administered medications prior to visit.     ROS Review of Systems  Constitutional: Negative for chills, fatigue, fever and unexpected weight change.  Eyes: Negative for visual disturbance.  Respiratory: Negative for cough and shortness of breath.   Cardiovascular: Positive for leg swelling. Negative for chest pain and palpitations.  Gastrointestinal: Negative for abdominal pain, blood in stool, constipation, diarrhea, nausea and vomiting.  Endocrine: Negative for polydipsia, polyphagia and polyuria.  Musculoskeletal: Positive for arthralgias. Negative for back pain, gait problem, myalgias and neck pain.  Skin: Negative for rash.  Allergic/Immunologic: Negative for immunocompromised state.  Neurological: Positive for light-headedness.  Hematological: Negative for adenopathy. Does not bruise/bleed easily.  Psychiatric/Behavioral: Negative for dysphoric mood, sleep disturbance and suicidal ideas. The patient is not nervous/anxious.      Objective:  BP 125/84   Pulse 86   Temp 97.8 F (36.6 C) (Oral)   Ht 5' 7"  (1.702 m)   Wt 195 lb 3.2 oz (88.5 kg)   SpO2 99%   BMI 30.57 kg/m   BP/Weight 09/16/2016 09/03/2016 2/37/6283  Systolic BP 151 761 607  Diastolic BP 84 86 97  Wt. (Lbs) 195.2 197.2 190  BMI 30.57 30.89 29.76    Physical Exam  Constitutional: He appears well-developed and well-nourished. No distress.  HENT:  Head: Normocephalic and atraumatic.  Neck: Normal range of motion. Neck supple.  Cardiovascular: Normal rate, regular rhythm, normal heart sounds and intact distal pulses.   Pulmonary/Chest: Effort normal and breath sounds normal.  Musculoskeletal: He exhibits edema (trace L leg with tenderness and palpable cord L calf ).       Feet:  Neurological: He is  alert.  Skin: Skin is warm and dry. No rash noted. No erythema.  Psychiatric: He has a normal mood and affect.   Lab Results  Component Value Date   HGBA1C 7.6 08/23/2016   CBG 222  Assessment & Plan:   Maurice was seen today for diabetes and cellulitis.  Diagnoses and all orders for this visit:  Uncontrolled type 2 diabetes mellitus with complication, with long-term current use of insulin (HCC) -     POCT glucose (manual entry) -     Discontinue: Insulin Detemir (LEVEMIR FLEXPEN) 100 UNIT/ML Pen; Inject 24 Units into the skin every morning. -     Insulin Detemir (LEVEMIR FLEXPEN) 100 UNIT/ML Pen; Inject 24 Units into the skin every morning. -     aspirin EC 81 MG tablet; Take 1 tablet (81 mg total) by mouth daily.  Pain and swelling of left lower leg -     VAS [place] LOWER EXTREMITY VENOUS (DVT); Future  Right foot pain -     DG Foot Complete Right; Future  Uncontrolled type 2 diabetes mellitus with complication, without long-term current use of insulin (HCC)    No orders of the defined types were placed in this encounter.   Follow-up: Return in about 4 weeks (around 10/14/2016) for diabetes. R foot pain .   Boykin Nearing  MD

## 2016-09-16 NOTE — Patient Instructions (Addendum)
Diagnoses and all orders for this visit:  Uncontrolled type 2 diabetes mellitus with complication, with long-term current use of insulin (HCC) -     POCT glucose (manual entry) -     Discontinue: Insulin Detemir (LEVEMIR FLEXPEN) 100 UNIT/ML Pen; Inject 24 Units into the skin every morning. -     Insulin Detemir (LEVEMIR FLEXPEN) 100 UNIT/ML Pen; Inject 24 Units into the skin every morning. -     aspirin EC 81 MG tablet; Take 1 tablet (81 mg total) by mouth daily.  Pain and swelling of left lower leg -     VAS [place] LOWER EXTREMITY VENOUS (DVT); Future  Right foot pain -     DG Foot Complete Right; Future  Uncontrolled type 2 diabetes mellitus with complication, without long-term current use of insulin ([place])   Call back to [facility] and inquire about diabetic shoes  Be sure to take daily baby aspirin, 81 mg   You will be called with ultrasound and x-ray results  f/u in 4 weeks for diabetes, R foot pain and leg swelling   Dr. Adrian Blackwater

## 2016-09-16 NOTE — Assessment & Plan Note (Signed)
Improved Continue levemir, increased to 24 U daily Continue metfomrin 1000 mg BID Continue glipizide 20 mg daily Not taking byetta

## 2016-09-16 NOTE — Assessment & Plan Note (Signed)
R foot cellulitis Improved with clindamycin Repeat x-ray

## 2016-09-16 NOTE — Assessment & Plan Note (Signed)
Pain with mid swelling Tenderness in posterior calf Plan: Venous doppler

## 2016-09-17 ENCOUNTER — Ambulatory Visit (HOSPITAL_COMMUNITY)
Admission: RE | Admit: 2016-09-17 | Discharge: 2016-09-17 | Disposition: A | Discharge status: Home / Self Care | Payer: Self-pay | Source: Ambulatory Visit | Attending: Family Medicine | Admitting: Family Medicine

## 2016-09-17 ENCOUNTER — Ambulatory Visit (INDEPENDENT_AMBULATORY_CARE_PROVIDER_SITE_OTHER): Payer: Self-pay | Admitting: Orthopedic Surgery

## 2016-09-17 ENCOUNTER — Telehealth: Payer: Self-pay | Admitting: *Deleted

## 2016-09-17 ENCOUNTER — Ambulatory Visit (INDEPENDENT_AMBULATORY_CARE_PROVIDER_SITE_OTHER): Payer: Self-pay

## 2016-09-17 ENCOUNTER — Encounter (INDEPENDENT_AMBULATORY_CARE_PROVIDER_SITE_OTHER): Payer: Self-pay | Admitting: Orthopedic Surgery

## 2016-09-17 DIAGNOSIS — M79662 Pain in left lower leg: Secondary | ICD-10-CM | POA: Insufficient documentation

## 2016-09-17 DIAGNOSIS — M79671 Pain in right foot: Secondary | ICD-10-CM

## 2016-09-17 DIAGNOSIS — M7989 Other specified soft tissue disorders: Secondary | ICD-10-CM | POA: Insufficient documentation

## 2016-09-17 DIAGNOSIS — I5032 Chronic diastolic (congestive) heart failure: Secondary | ICD-10-CM

## 2016-09-17 DIAGNOSIS — M6701 Short Achilles tendon (acquired), right ankle: Secondary | ICD-10-CM | POA: Insufficient documentation

## 2016-09-17 MED ORDER — FUROSEMIDE 20 MG PO TABS: 20.0000 mg | ORAL_TABLET | Freq: Every day | ORAL | 2 refills | Status: DC

## 2016-09-17 NOTE — Progress Notes (Signed)
VASCULAR LAB PRELIMINARY  PRELIMINARY  PRELIMINARY  PRELIMINARY  Left lower extremity venous duplex completed.    Preliminary report:  Left:  No evidence of DVT, superficial thrombosis, or Baker's cyst.  Benuel Ly, RVS 09/17/2016, 3:13 PM

## 2016-09-17 NOTE — Telephone Encounter (Signed)
[place] with Vascular Lab at [facility] call report for  LLE venous doppler.   Test neg  for DVT

## 2016-09-17 NOTE — Telephone Encounter (Signed)
Noted, negative venous doppler  Patient advised to change lasix to 20 mg daily for leg swelling and BP control Please inform patient

## 2016-09-17 NOTE — Progress Notes (Signed)
Office Visit Note   Patient: Shane [name]           Date of Birth: 14-May-1971           MRN: [id number] Visit Date: 09/17/2016              Requested by: Boykin Nearing, MD [address] [place], [place] [address] PCP: Minerva Ends, MD  Chief Complaint  Patient presents with  . Right Foot - Pain  . Left Foot - Follow-up    Left transmetatarsal amputation      HPI: Patient is a 46 year old gentleman who presents today status post a left transmetatarsal amputation feels that his foot is doing well he noticed acute pain in the right foot over the lateral border of his foot he went to his primary care physician who recommended a follow-up here for consideration for possible Charcot collapse of the right foot.  Assessment & Plan: Visit Diagnoses:  1. Pain in right foot   2. Achilles tendon contracture, right     Plan: We'll have patient continue with his protective shoe wear and custom orthotics. Patient was given instructions to demonstrate heel cord stretching on the right most of his symptoms seem to be coming from the heel cord contracture on the right.  Follow-Up Instructions: Return in about 3 months (around 12/17/2016).   Ortho Exam  Patient is alert, oriented, no adenopathy, well-dressed, normal affect, normal respiratory effort. Patient has an antalgic gait. Examination the left foot the transmetatarsal amputation is well-healed there is no redness no cellulitis no open wounds he has good dorsiflexion of the ankle. Examination the right foot is palpable dorsalis pedis pulse he has significant heel cord contracture the right with dorsiflexion about 10 short of neutral with his knee extended. He has good dorsiflexion with the knee flexed. There is no redness no cellulitis no warmth no open wounds no signs of infection no signs of Charcot collapse.  Imaging: Xr Foot Complete Right  Result Date: 09/17/2016 Three-view radiographs the right foot shows calcification of  the posterior tibial artery as well as the dorsalis pedis artery. There are no bony changes no evidence of a Charcot arthropathy no evidence of infection.   Labs: Lab Results  Component Value Date   HGBA1C 7.6 08/23/2016   HGBA1C 8.7 (H) 06/08/2016   HGBA1C 8.4 05/30/2016   ESRSEDRATE 22 (H) 06/24/2016   ESRSEDRATE 27 (H) 06/08/2016   ESRSEDRATE 48 (H) 02/20/2016   CRP <0.8 06/24/2016   CRP 0.5 02/20/2016   CRP 0.8 12/31/2015   REPTSTATUS 06/28/2016 FINAL 06/27/2016   GRAMSTAIN  02/21/2016    RARE WBC PRESENT, PREDOMINANTLY MONONUCLEAR RARE GRAM POSITIVE COCCI IN PAIRS    CULT NO GROWTH 06/27/2016   LABORGA STAPHYLOCOCCUS AUREUS 02/21/2016    Orders:  Orders Placed This Encounter  Procedures  . XR Foot Complete Right   No orders of the defined types were placed in this encounter.    Procedures: No procedures performed  Clinical Data: No additional findings.  ROS:  All other systems negative, except as noted in the HPI. Review of Systems  Objective: Vital Signs: There were no vitals taken for this visit.  Specialty Comments:  No specialty comments available.  PMFS History: Patient Active Problem List   Diagnosis Date Noted  . Achilles tendon contracture, right 09/17/2016  . Pain and swelling of left lower leg 09/16/2016  . Cellulitis of right foot 09/03/2016  . CKD (chronic kidney disease) stage 3, GFR 30-59  ml/min 08/27/2016  . Dyslipidemia associated with type 2 diabetes mellitus ([place]) 07/22/2016  . Constipation 07/02/2016  . Osteomyelitis of left foot ([place]) 07/02/2016  . S/P transmetatarsal amputation of foot, left ([place]) 07/02/2016  . Dyspnea on exertion 07/02/2016  . Subacute osteomyelitis, left ankle and foot ([place]) 06/08/2016  . Diabetic gastroparesis ([place]) 05/30/2016  . Chronic ulcer of left foot ([place]) 05/22/2016  . Dermatitis 04/26/2016  . Buzzing in ear, right 03/26/2016  . Joint pain 03/11/2016  . Weakness 03/08/2016  . Dizziness 03/03/2016    . Diabetic ulcer of right foot associated with type 2 diabetes mellitus, with necrosis of muscle ([place]) 02/21/2016  . Chronic diastolic CHF (congestive heart failure), NYHA class 1 ([place]) 12/30/2015  . Diabetes mellitus type 2, uncontrolled, with complications ([place]) 63/06/6008  . Depression with anxiety 12/01/2015  . Pain in the chest 12/01/2015  . Insomnia 11/21/2015  . GERD (gastroesophageal reflux disease) 08/18/2015  . Erectile dysfunction 07/04/2015  . Symptomatic anemia 04/24/2015  . AKI (acute kidney injury) ([place]) 04/24/2015  . Left arm weakness 04/02/2015  . Sensory disturbance 04/02/2015  . Essential hypertension 04/02/2015  . Hemispheric carotid artery syndrome   . HLD (hyperlipidemia)   . Headache    Past Medical History:  Diagnosis Date  . Anemia   . Bipolar disorder ([place])   . Cellulitis and abscess of left leg 06/2016  . Chest pain    a. 2015 Reportedly normal stress test in [place].  Marland Kitchen Chronic diastolic CHF (congestive heart failure) ([facility])    a.03/2015 Echo: EF 55-60%, Gr 1 DD, mild MR, triv PR.  . Depression   . Depression with anxiety   . Dyspnea    with exertion  . GERD (gastroesophageal reflux disease)   . Hyperlipidemia   . Hypertension    a. 08/2014 Admitted with hypertensive urgency.  . Insomnia   . Internal carotid artery stenosis   . Lower GI bleed 07/04/2015  . Neuropathy ([place])   . Refusal of blood transfusions as patient is Jehovah's Witness   . TIA (transient ischemic attack) 08/2014; 03/2015   a. 08/2014 in setting of hypertensive urgency.  . Type II diabetes mellitus ([place])    started insulin spring 2016, Type II  . Vitamin D deficiency spring 2016    Family History  Problem Relation Age of Onset  . Hypertension Mother   . Diabetes Mother   . Hyperlipidemia Mother   . Heart disease Mother     s/p pacemaker  . Diabetes Father   . Hypertension Father   . Stroke Father   . Heart attack Father     first MI @ 60.  . Stroke Brother     Past  Surgical History:  Procedure Laterality Date  . AMPUTATION Left 08/03/2015   Procedure: AMPUTATION LEFT GREAT TOE;  Surgeon: Newt Minion, MD;  Location: [facility];  Service: Orthopedics;  Laterality: Left;  . AMPUTATION Left 12/02/2015   Procedure: amputation of left 2nd digit  . AMPUTATION Left 04/10/2016   Procedure: Left 2nd Toe Amputation at MTP Joint;  Surgeon: Newt Minion, MD;  Location: [facility];  Service: Orthopedics;  Laterality: Left;  . AMPUTATION Left 06/09/2016   Procedure: AMPUTATION LEFT THIRD TOE;  Surgeon: [name], MD;  Location: [facility];  Service: Orthopedics;  Laterality: Left;  . AMPUTATION Left 06/26/2016   Procedure: Left Foot Transmetatarsal Amputation;  Surgeon: Newt Minion, MD;  Location: [facility];  Service: Orthopedics;  Laterality: Left;  .  APPLICATION OF WOUND VAC Left 12/19/2015   Procedure: APPLICATION OF WOUND VAC;  Surgeon: Meredith Pel, MD;  Location: [facility];  Service: Orthopedics;  Laterality: Left;  . CIRCUMCISION    . COLONOSCOPY N/A 01/22/2016   Procedure: COLONOSCOPY;  Surgeon: Gatha Mayer, MD;  Location: [facility];  Service: Endoscopy;  Laterality: N/A;  . ESOPHAGOGASTRODUODENOSCOPY N/A 01/19/2016   Procedure: ESOPHAGOGASTRODUODENOSCOPY (EGD);  Surgeon: Manus Gunning, MD;  Location: [facility];  Service: Gastroenterology;  Laterality: N/A;  . ESOPHAGOGASTRODUODENOSCOPY N/A 01/22/2016   Procedure: ESOPHAGOGASTRODUODENOSCOPY (EGD);  Surgeon: Gatha Mayer, MD;  Location: [facility];  Service: Endoscopy;  Laterality: N/A;  . I&D EXTREMITY Left 12/02/2015   Procedure: IRRIGATION AND DEBRIDEMENT OF FOOT; LEFT SECOND TOE AMPUTATION;  Surgeon: Meredith Pel, MD;  Location: [facility];  Service: Orthopedics;  Laterality: Left;  . I&D EXTREMITY Left 12/19/2015   Procedure: I & D LEFT FOOT WITH BEADS ;  Surgeon: Meredith Pel, MD;  Location: [facility];  Service: Orthopedics;  Laterality: Left;  . I&D EXTREMITY Right 01/17/2016   Procedure: IRRIGATION  AND DEBRIDEMENT RIGHT FOOT;  Surgeon: Newt Minion, MD;  Location: [facility];  Service: Orthopedics;  Laterality: Right;  . INCISION AND DRAINAGE FOOT Right 01/17/2016  . INGUINAL HERNIA REPAIR Bilateral ~ 1983- ~ 1986  . TONSILLECTOMY  ~ 66   Social History   Occupational History  . Not on file.   Social History Main Topics  . Smoking status: Never Smoker  . Smokeless tobacco: Never Used  . Alcohol use No  . Drug use: No  . Sexual activity: No

## 2016-09-19 ENCOUNTER — Encounter (HOSPITAL_COMMUNITY): Payer: Self-pay | Admitting: Emergency Medicine

## 2016-09-19 DIAGNOSIS — E1122 Type 2 diabetes mellitus with diabetic chronic kidney disease: Secondary | ICD-10-CM | POA: Insufficient documentation

## 2016-09-19 DIAGNOSIS — Z7982 Long term (current) use of aspirin: Secondary | ICD-10-CM | POA: Insufficient documentation

## 2016-09-19 DIAGNOSIS — Z8673 Personal history of transient ischemic attack (TIA), and cerebral infarction without residual deficits: Secondary | ICD-10-CM | POA: Insufficient documentation

## 2016-09-19 DIAGNOSIS — I130 Hypertensive heart and chronic kidney disease with heart failure and stage 1 through stage 4 chronic kidney disease, or unspecified chronic kidney disease: Secondary | ICD-10-CM | POA: Insufficient documentation

## 2016-09-19 DIAGNOSIS — Z794 Long term (current) use of insulin: Secondary | ICD-10-CM | POA: Insufficient documentation

## 2016-09-19 DIAGNOSIS — R600 Localized edema: Secondary | ICD-10-CM | POA: Insufficient documentation

## 2016-09-19 DIAGNOSIS — N183 Chronic kidney disease, stage 3 (moderate): Secondary | ICD-10-CM | POA: Insufficient documentation

## 2016-09-19 DIAGNOSIS — I5032 Chronic diastolic (congestive) heart failure: Secondary | ICD-10-CM | POA: Insufficient documentation

## 2016-09-19 DIAGNOSIS — E1140 Type 2 diabetes mellitus with diabetic neuropathy, unspecified: Secondary | ICD-10-CM | POA: Insufficient documentation

## 2016-09-19 NOTE — ED Triage Notes (Signed)
Patient with left leg pain.  Patient had half of foot amputated 3 months ago, started having pain in that foot on Wednesday, getting worse with redness and lumps and swelling going up his leg.

## 2016-09-20 ENCOUNTER — Emergency Department (HOSPITAL_COMMUNITY): Payer: Self-pay

## 2016-09-20 ENCOUNTER — Emergency Department (HOSPITAL_COMMUNITY)
Admission: EM | Admit: 2016-09-20 | Discharge: 2016-09-20 | Disposition: A | Discharge status: Home / Self Care | Payer: Self-pay | Attending: Emergency Medicine | Admitting: Emergency Medicine

## 2016-09-20 DIAGNOSIS — R609 Edema, unspecified: Secondary | ICD-10-CM

## 2016-09-20 LAB — COMPREHENSIVE METABOLIC PANEL
ALT: 15 U/L — AB (ref 17–63)
AST: 20 U/L (ref 15–41)
Albumin: 3.7 g/dL (ref 3.5–5.0)
Alkaline Phosphatase: 47 U/L (ref 38–126)
Anion gap: 9 (ref 5–15)
BILIRUBIN TOTAL: 0.5 mg/dL (ref 0.3–1.2)
BUN: 23 mg/dL — AB (ref 6–20)
CHLORIDE: 103 mmol/L (ref 101–111)
CO2: 27 mmol/L (ref 22–32)
CREATININE: 1.24 mg/dL (ref 0.61–1.24)
Calcium: 8.9 mg/dL (ref 8.9–10.3)
Glucose, Bld: 144 mg/dL — ABNORMAL HIGH (ref 65–99)
Potassium: 4.4 mmol/L (ref 3.5–5.1)
SODIUM: 139 mmol/L (ref 135–145)
TOTAL PROTEIN: 6.7 g/dL (ref 6.5–8.1)

## 2016-09-20 LAB — CBC WITH DIFFERENTIAL/PLATELET
BASOS ABS: 0 10*3/uL (ref 0.0–0.1)
Basophils Relative: 0 %
EOS ABS: 0.3 10*3/uL (ref 0.0–0.7)
EOS PCT: 3 %
HCT: 32.4 % — ABNORMAL LOW (ref 39.0–52.0)
Hemoglobin: 10.3 g/dL — ABNORMAL LOW (ref 13.0–17.0)
Lymphocytes Relative: 29 %
Lymphs Abs: 2.3 10*3/uL (ref 0.7–4.0)
MCH: 29.7 pg (ref 26.0–34.0)
MCHC: 31.8 g/dL (ref 30.0–36.0)
MCV: 93.4 fL (ref 78.0–100.0)
Monocytes Absolute: 0.5 10*3/uL (ref 0.1–1.0)
Monocytes Relative: 6 %
Neutro Abs: 4.8 10*3/uL (ref 1.7–7.7)
Neutrophils Relative %: 62 %
PLATELETS: 198 10*3/uL (ref 150–400)
RBC: 3.47 MIL/uL — AB (ref 4.22–5.81)
RDW: 13.3 % (ref 11.5–15.5)
WBC: 7.8 10*3/uL (ref 4.0–10.5)

## 2016-09-20 LAB — I-STAT CG4 LACTIC ACID, ED: LACTIC ACID, VENOUS: 0.81 mmol/L (ref 0.5–1.9)

## 2016-09-20 MED ORDER — HYDROCODONE-ACETAMINOPHEN 5-325 MG PO TABS: 1.0000 | ORAL_TABLET | ORAL | 0 refills | Status: DC | PRN

## 2016-09-20 MED ORDER — T.E.D. KNEE LENGTH/S-REGULAR MISC: 0 refills | Status: DC

## 2016-09-20 MED ORDER — FUROSEMIDE 40 MG PO TABS: 40.0000 mg | ORAL_TABLET | Freq: Every day | ORAL | 0 refills | Status: DC

## 2016-09-20 NOTE — ED Notes (Signed)
Pt departed in NAD, refused use of wheelchair.  

## 2016-09-20 NOTE — ED Provider Notes (Signed)
[facility] DEPT Provider Note   CSN: [id number] Arrival date & time: 09/19/16  2346  By signing my name below, I, Shane Bitter., attest that this documentation has been prepared under the direction and in the presence of Shane Greek, MD. Electronically signed: [name]., ED Scribe. 09/20/16. 2:13 AM.   History   Chief Complaint Chief Complaint  Patient presents with  . Leg Pain    HPI Shane [name] is a 46 y.o. male who presents to the Emergency Department complaining of constant mild L leg pain with onset x3 days. Pt states that he developed pain and swelling to the L lower extremity x3 days ago. Pt notes that he had his L foot amputated x3 months ago and has had issues with his L leg for the past x1 year. He has taken ibuprofen with no relief. Pt denies any other complaints. He denies hx of blood clot, last test being x2 days ago. Of note, pt is on lasix 20 mg daily. Pt was recently on doxycycline for an infection. Pt states that the last time he had surgery, the surgeons wanted to amputate from the knee down.    The history is provided by the patient. No language interpreter was used.    Past Medical History:  Diagnosis Date  . Anemia   . Bipolar disorder ([place])   . Cellulitis and abscess of left leg 06/2016  . Chest pain    a. 2015 Reportedly normal stress test in [place].  Marland Kitchen Chronic diastolic CHF (congestive heart failure) (HCC)    a.03/2015 Echo: EF 55-60%, Gr 1 DD, mild MR, triv PR.  . Depression   . Depression with anxiety   . Dyspnea    with exertion  . GERD (gastroesophageal reflux disease)   . Hyperlipidemia   . Hypertension    a. 08/2014 Admitted with hypertensive urgency.  . Insomnia   . Internal carotid artery stenosis   . Lower GI bleed 07/04/2015  . Neuropathy ([place])   . Refusal of blood transfusions as patient is Jehovah's Witness   . TIA (transient ischemic attack) 08/2014; 03/2015   a. 08/2014 in setting of hypertensive  urgency.  . Type II diabetes mellitus ([place])    started insulin spring 2016, Type II  . Vitamin D deficiency spring 2016    Patient Active Problem List   Diagnosis Date Noted  . Achilles tendon contracture, right 09/17/2016  . Pain and swelling of left lower leg 09/16/2016  . Cellulitis of right foot 09/03/2016  . CKD (chronic kidney disease) stage 3, GFR 30-59 ml/min 08/27/2016  . Dyslipidemia associated with type 2 diabetes mellitus ([place]) 07/22/2016  . Constipation 07/02/2016  . Osteomyelitis of left foot ([place]) 07/02/2016  . S/P transmetatarsal amputation of foot, left ([place]) 07/02/2016  . Dyspnea on exertion 07/02/2016  . Subacute osteomyelitis, left ankle and foot ([place]) 06/08/2016  . Diabetic gastroparesis ([place]) 05/30/2016  . Chronic ulcer of left foot ([place]) 05/22/2016  . Dermatitis 04/26/2016  . Buzzing in ear, right 03/26/2016  . Joint pain 03/11/2016  . Weakness 03/08/2016  . Dizziness 03/03/2016  . Diabetic ulcer of right foot associated with type 2 diabetes mellitus, with necrosis of muscle ([place]) 02/21/2016  . Chronic diastolic CHF (congestive heart failure), NYHA class 1 ([place]) 12/30/2015  . Diabetes mellitus type 2, uncontrolled, with complications ([place]) 07/62/2633  . Depression with anxiety 12/01/2015  . Pain in the chest 12/01/2015  . Insomnia 11/21/2015  . GERD (gastroesophageal reflux disease)  08/18/2015  . Erectile dysfunction 07/04/2015  . Symptomatic anemia 04/24/2015  . AKI (acute kidney injury) ([place]) 04/24/2015  . Left arm weakness 04/02/2015  . Sensory disturbance 04/02/2015  . Essential hypertension 04/02/2015  . Hemispheric carotid artery syndrome   . HLD (hyperlipidemia)   . Headache     Past Surgical History:  Procedure Laterality Date  . AMPUTATION Left 08/03/2015   Procedure: AMPUTATION LEFT GREAT TOE;  Surgeon: Newt Minion, MD;  Location: [facility];  Service: Orthopedics;  Laterality: Left;  . AMPUTATION Left 12/02/2015   Procedure: amputation of  left 2nd digit  . AMPUTATION Left 04/10/2016   Procedure: Left 2nd Toe Amputation at MTP Joint;  Surgeon: Newt Minion, MD;  Location: [facility];  Service: Orthopedics;  Laterality: Left;  . AMPUTATION Left 06/09/2016   Procedure: AMPUTATION LEFT THIRD TOE;  Surgeon: [name], MD;  Location: [facility];  Service: Orthopedics;  Laterality: Left;  . AMPUTATION Left 06/26/2016   Procedure: Left Foot Transmetatarsal Amputation;  Surgeon: Newt Minion, MD;  Location: [facility];  Service: Orthopedics;  Laterality: Left;  . APPLICATION OF WOUND VAC Left 12/19/2015   Procedure: APPLICATION OF WOUND VAC;  Surgeon: Meredith Pel, MD;  Location: [facility];  Service: Orthopedics;  Laterality: Left;  . CIRCUMCISION    . COLONOSCOPY N/A 01/22/2016   Procedure: COLONOSCOPY;  Surgeon: Gatha Mayer, MD;  Location: [facility];  Service: Endoscopy;  Laterality: N/A;  . ESOPHAGOGASTRODUODENOSCOPY N/A 01/19/2016   Procedure: ESOPHAGOGASTRODUODENOSCOPY (EGD);  Surgeon: Manus Gunning, MD;  Location: [facility];  Service: Gastroenterology;  Laterality: N/A;  . ESOPHAGOGASTRODUODENOSCOPY N/A 01/22/2016   Procedure: ESOPHAGOGASTRODUODENOSCOPY (EGD);  Surgeon: Gatha Mayer, MD;  Location: [facility] ENDOSCOPY;  Service: Endoscopy;  Laterality: N/A;  . I&D EXTREMITY Left 12/02/2015   Procedure: IRRIGATION AND DEBRIDEMENT OF FOOT; LEFT SECOND TOE AMPUTATION;  Surgeon: Meredith Pel, MD;  Location: [facility];  Service: Orthopedics;  Laterality: Left;  . I&D EXTREMITY Left 12/19/2015   Procedure: I & D LEFT FOOT WITH BEADS ;  Surgeon: Meredith Pel, MD;  Location: [facility];  Service: Orthopedics;  Laterality: Left;  . I&D EXTREMITY Right 01/17/2016   Procedure: IRRIGATION AND DEBRIDEMENT RIGHT FOOT;  Surgeon: Newt Minion, MD;  Location: [facility];  Service: Orthopedics;  Laterality: Right;  . INCISION AND DRAINAGE FOOT Right 01/17/2016  . INGUINAL HERNIA REPAIR Bilateral ~ 1983- ~ 1986  . TONSILLECTOMY  ~ 1985       Home  Medications    Prior to Admission medications   Medication Sig Start Date End Date Taking? Authorizing Provider  ACCU-CHEK SOFTCLIX LANCETS lancets Use as instructed 07/22/16   Boykin Nearing, MD  aspirin EC 81 MG tablet Take 1 tablet (81 mg total) by mouth daily. 09/16/16   Josalyn Funches, MD  atorvastatin (LIPITOR) 80 MG tablet TAKE 1 TABLET BY MOUTH EVERY MORNING. 09/02/16   Boykin Nearing, MD  Blood Glucose Monitoring Suppl (ACCU-CHEK AVIVA PLUS) w/Device KIT Use as directed 08/06/16   Boykin Nearing, MD  carvedilol (COREG) 12.5 MG tablet Take 12.5 mg by mouth 2 (two) times daily.    Historical Provider, MD  divalproex (DEPAKOTE) 500 MG DR tablet Take 2 tablets (1,000 mg total) by mouth at bedtime. 12/04/15   Christina P Rama, MD  exenatide (BYETTA 10 MCG PEN) 10 MCG/0.04ML SOPN injection Inject 0.04 mLs (10 mcg total) into the skin 2 (two) times daily with a meal. 09/03/16   Josalyn Funches,  MD  FLUoxetine (PROZAC) 20 MG tablet Take 1 tablet (20 mg total) by mouth every morning. 03/25/16   Josalyn Funches, MD  fluticasone (FLONASE) 50 MCG/ACT nasal spray Place 2 sprays into both nostrils daily. 09/01/16   Julianne Rice, MD  folic acid (FOLVITE) 1 MG tablet Take 1 tablet (1 mg total) by mouth daily. 07/04/16   Asencion Partridge, MD  furosemide (LASIX) 20 MG tablet Take 1 tablet (20 mg total) by mouth daily. 09/17/16   Josalyn Funches, MD  furosemide (LASIX) 40 MG tablet Take 1 tablet (40 mg total) by mouth daily. 09/20/16   Shane Greek, MD  gabapentin (NEURONTIN) 100 MG capsule Take 2 capsules (200 mg total) by mouth 3 (three) times daily. Patient taking differently: Take 100 mg by mouth every morning.  01/23/16   Hosie Poisson, MD  glipiZIDE (GLUCOTROL XL) 10 MG 24 hr tablet Take 2 tablets (20 mg total) by mouth daily with breakfast. 09/03/16   Boykin Nearing, MD  glucose blood (ACCU-CHEK AVIVA PLUS) test strip Use as instructed 08/08/16   Boykin Nearing, MD  HYDROcodone-acetaminophen  (NORCO/VICODIN) 5-325 MG tablet Take 1 tablet by mouth every 4 (four) hours as needed for moderate pain. 09/20/16   Shane Greek, MD  ibuprofen (ADVIL,MOTRIN) 800 MG tablet Take 800 mg by mouth every 8 (eight) hours as needed (for pain).    Historical Provider, MD  Insulin Detemir (LEVEMIR FLEXPEN) 100 UNIT/ML Pen Inject 24 Units into the skin every morning. 09/16/16   Josalyn Funches, MD  Insulin Pen Needle (B-D ULTRAFINE III SHORT PEN) 31G X 8 MM MISC 1 application by Does not apply route 3 (three) times daily. 08/02/16   Boykin Nearing, MD  Lancet Devices [facility]) lancets Use as instructed 08/06/16   Boykin Nearing, MD  LEVEMIR FLEXPEN 100 UNIT/ML Pen  09/05/16   Historical Provider, MD  lisinopril (PRINIVIL,ZESTRIL) 40 MG tablet Take 40 mg by mouth daily. 07/29/16   Historical Provider, MD  loratadine (CLARITIN) 10 MG tablet Take 1 tablet (10 mg total) by mouth daily. 09/01/16   Julianne Rice, MD  metFORMIN (GLUCOPHAGE) 1000 MG tablet TAKE 1 TABLET BY MOUTH 2 TIMES DAILY WITH A MEAL. Patient taking differently: Take 1,000 mg by mouth in the morning 08/06/16   Boykin Nearing, MD  metoCLOPramide (REGLAN) 5 MG tablet Take 1 tablet (5 mg total) by mouth 3 (three) times daily before meals. 05/30/16   Josalyn Funches, MD  ondansetron (ZOFRAN ODT) 8 MG disintegrating tablet Take 1 tablet (8 mg total) by mouth every 8 (eight) hours as needed for nausea or vomiting. 02/26/16   Boykin Nearing, MD  pantoprazole (PROTONIX) 40 MG tablet Take 40 mg by mouth daily.  04/23/16   Historical Provider, MD  polyethylene glycol (MIRALAX / GLYCOLAX) packet Take 17 g by mouth daily as needed for moderate constipation. 07/03/16   Asencion Partridge, MD  ranitidine (ZANTAC) 300 MG tablet Take 1 tablet (300 mg total) by mouth at bedtime. 08/23/16   Josalyn Funches, MD  senna-docusate (SENOKOT-S) 8.6-50 MG tablet Take 1 tablet by mouth at bedtime. Patient taking differently: Take 1 tablet by mouth at bedtime as needed  for mild constipation.  07/03/16   Asencion Partridge, MD  traZODone (DESYREL) 100 MG tablet Take 1 tablet (100 mg total) by mouth at bedtime. 02/26/16   Josalyn Funches, MD  vitamin B-12 1000 MCG tablet Take 1 tablet (1,000 mcg total) by mouth daily. 07/04/16   Asencion Partridge, MD  Vitamin D, Ergocalciferol, (DRISDOL)  50000 units CAPS capsule Take 1 capsule (50,000 Units total) by mouth every 30 (thirty) days. Every 30 days Patient taking differently: Take 50,000 Units by mouth every 30 (thirty) days.  04/26/16   Boykin Nearing, MD    Family History Family History  Problem Relation Age of Onset  . Hypertension Mother   . Diabetes Mother   . Hyperlipidemia Mother   . Heart disease Mother     s/p pacemaker  . Diabetes Father   . Hypertension Father   . Stroke Father   . Heart attack Father     first MI @ 49.  . Stroke Brother     Social History Social History  Substance Use Topics  . Smoking status: Never Smoker  . Smokeless tobacco: Never Used  . Alcohol use No     Allergies   Lactose intolerance (gi); Other; and Milk-related compounds   Review of Systems Review of Systems  Constitutional: Negative for fever.  Musculoskeletal: Positive for arthralgias (L leg).  All other systems reviewed and are negative.    Physical Exam Updated Vital Signs BP (!) 145/97 (BP Location: Left Arm)   Pulse (!) 102   Temp 98.5 F (36.9 C) (Oral)   Resp 18   SpO2 100%   Physical Exam  Constitutional: He is oriented to person, place, and time. He appears well-developed and well-nourished. No distress.  HENT:  Head: Normocephalic and atraumatic.  Right Ear: Hearing normal.  Left Ear: Hearing normal.  Nose: Nose normal.  Mouth/Throat: Oropharynx is clear and moist and mucous membranes are normal.  Eyes: Conjunctivae and EOM are normal. Pupils are equal, round, and reactive to light.  Neck: Normal range of motion. Neck supple.  Cardiovascular: Regular rhythm, S1 normal and S2 normal.  Exam  reveals no gallop and no friction rub.   No murmur heard. Pulmonary/Chest: Effort normal and breath sounds normal. No respiratory distress. He exhibits no tenderness.  Abdominal: Soft. Normal appearance and bowel sounds are normal. There is no hepatosplenomegaly. There is no tenderness. There is no rebound, no guarding, no tenderness at McBurney's point and negative Murphy's sign. No hernia.  Musculoskeletal: Normal range of motion.       Left lower leg: He exhibits edema.  Diffuse non-pitting edema of the L lower leg from the knee down. Transmetatarsal amputation that is well healed without signs of infection.  Neurological: He is alert and oriented to person, place, and time. He has normal strength. No cranial nerve deficit or sensory deficit. Coordination normal. GCS eye subscore is 4. GCS verbal subscore is 5. GCS motor subscore is 6.  Skin: Skin is warm, dry and intact. No rash noted. No cyanosis.  Psychiatric: He has a normal mood and affect. His speech is normal and behavior is normal. Thought content normal.  Nursing note and vitals reviewed.    ED Treatments / Results   DIAGNOSTIC STUDIES: Oxygen Saturation is 100% on RA, normal by my interpretation.   COORDINATION OF CARE: 2:13 AM-Discussed next steps with pt. Pt verbalized understanding and is agreeable with the plan.    Labs (all labs ordered are listed, but only abnormal results are displayed) Labs Reviewed  CBC WITH DIFFERENTIAL/PLATELET - Abnormal; Notable for the following:       Result Value   RBC 3.47 (*)    Hemoglobin 10.3 (*)    HCT 32.4 (*)    All other components within normal limits  COMPREHENSIVE METABOLIC PANEL - Abnormal; Notable for the following:  Glucose, Bld 144 (*)    BUN 23 (*)    ALT 15 (*)    All other components within normal limits  I-STAT CG4 LACTIC ACID, ED    EKG  EKG Interpretation None       Radiology Dg Tibia/fibula Left  Result Date: 09/20/2016 CLINICAL DATA:  46 y/o M;  history of amputation of left distal foot with redness and swelling from the left foot to the knee. EXAM: LEFT TIBIA AND FIBULA - 2 VIEW COMPARISON:  06/24/2016 left foot MRI. FINDINGS: No acute fracture or dislocation. No focal lucency of tibia or fibula to suggest osteomyelitis. Knee and ankle joints are grossly maintained. Extensive vascular calcification. Soft tissue swelling noted diffusely. IMPRESSION: No acute osseous abnormality.  Soft tissue swelling noted diffusely. Electronically Signed   By: Kristine Garbe M.D.   On: 09/20/2016 00:51   Dg Foot Complete Left  Result Date: 09/20/2016 CLINICAL DATA:  Erythema and swelling of the left lower extremity EXAM: LEFT FOOT - COMPLETE 3+ VIEW COMPARISON:  06/23/2016, 06/24/2016 FINDINGS: Transmetatarsal amputation. No frank bony destruction. No soft tissue gas. No radiopaque foreign body. No postamputation radiographs for comparison. IMPRESSION: Transmetatarsal amputation. No radiographic evidence of osteomyelitis. MRI will be more sensitive if additional imaging is clinically warranted. Electronically Signed   By: Andreas Newport M.D.   On: 09/20/2016 00:50    Procedures Procedures (including critical care time)  Medications Ordered in ED Medications - No data to display   Initial Impression / Assessment and Plan / ED Course  I have reviewed the triage vital signs and the nursing notes.  Pertinent labs & imaging results that were available during my care of the patient were reviewed by me and considered in my medical decision making (see chart for details).     Patient presents with complaints of pain and swelling of left lower leg. Patient had transmetatarsal amputation of left foot 3 months ago. Over the last couple of days he has had pain and swelling of the foot. He was sent for outpatient duplex ultrasound yesterday that revealed no evidence of DVT. Blood work is normal today. Examination reveals a well-healed wound without any  outward signs of infection. X-ray unremarkable as well. No evidence of arterial occlusion. No evidence of DVT. No evidence of infection, patient can continue conservative management. Will give analgesia. Increase Lasix from 20-40 mg daily. Elevate foot and follow-up with Dr. Sharol Given.  Final Clinical Impressions(s) / ED Diagnoses   Final diagnoses:  Peripheral edema    New Prescriptions New Prescriptions   FUROSEMIDE (LASIX) 40 MG TABLET    Take 1 tablet (40 mg total) by mouth daily.   HYDROCODONE-ACETAMINOPHEN (NORCO/VICODIN) 5-325 MG TABLET    Take 1 tablet by mouth every 4 (four) hours as needed for moderate pain.   I personally performed the services described in this documentation, which was scribed in my presence. The recorded information has been reviewed and is accurate.     Shane Greek, MD 09/20/16 [phone]

## 2016-09-23 ENCOUNTER — Other Ambulatory Visit: Payer: Self-pay | Admitting: Family Medicine

## 2016-09-23 ENCOUNTER — Other Ambulatory Visit: Payer: Self-pay | Admitting: Internal Medicine

## 2016-09-23 DIAGNOSIS — I10 Essential (primary) hypertension: Secondary | ICD-10-CM

## 2016-09-23 DIAGNOSIS — K219 Gastro-esophageal reflux disease without esophagitis: Secondary | ICD-10-CM

## 2016-09-23 MED FILL — PANTOPRAZOLE SOD DR 40 MG T: 40 | 30 days supply | Qty: 30 | Fill #0

## 2016-09-23 MED FILL — METOCLOPRAMIDE 5 MG TABLET: 5 | 30 days supply | Qty: 90 | Fill #1

## 2016-09-24 MED FILL — metFORMIN HCL 1000 MG TABS: 1000 | 30 days supply | Qty: 60 | Fill #0

## 2016-09-26 ENCOUNTER — Telehealth: Payer: Self-pay | Admitting: Family Medicine

## 2016-09-26 DIAGNOSIS — F3481 Disruptive mood dysregulation disorder: Secondary | ICD-10-CM

## 2016-09-26 DIAGNOSIS — F418 Other specified anxiety disorders: Secondary | ICD-10-CM

## 2016-09-26 NOTE — Telephone Encounter (Signed)
Patient calling requesting to speak to nurse regarding a referral to a mental health facility

## 2016-09-26 NOTE — Telephone Encounter (Signed)
Will route to PCP 

## 2016-09-27 ENCOUNTER — Other Ambulatory Visit: Payer: Self-pay | Admitting: Family Medicine

## 2016-09-27 DIAGNOSIS — F3481 Disruptive mood dysregulation disorder: Secondary | ICD-10-CM

## 2016-09-27 MED FILL — ATORVASTATIN 80 MG TABLET: 80 | 30 days supply | Qty: 30 | Fill #1

## 2016-09-27 MED FILL — glipiZIDE ER 10 MG TB24: 10 | 30 days supply | Qty: 30 | Fill #5

## 2016-09-27 MED FILL — LISINOPRIL 40 MG TABLET: 40 | 30 days supply | Qty: 30 | Fill #0

## 2016-09-27 NOTE — Telephone Encounter (Signed)
Referral placed However, patient can also self refer  I high recommend [facility]

## 2016-10-02 NOTE — Telephone Encounter (Signed)
Pt was called and informed of referral. Pt is also requesting a a refill on Depakote.

## 2016-10-03 MED ORDER — DIVALPROEX SODIUM 500 MG PO DR TAB
1000.0000 mg | DELAYED_RELEASE_TABLET | Freq: Every day | ORAL | 2 refills | Status: DC
Start: 2016-10-03 — End: 2016-10-07

## 2016-10-03 NOTE — Telephone Encounter (Signed)
Depakote refilled.

## 2016-10-06 NOTE — Assessment & Plan Note (Signed)
Restart norvasc 5 mg daily for BP 145/93

## 2016-10-07 ENCOUNTER — Other Ambulatory Visit: Payer: Self-pay | Admitting: Family Medicine

## 2016-10-07 DIAGNOSIS — F3481 Disruptive mood dysregulation disorder: Secondary | ICD-10-CM

## 2016-10-07 MED FILL — DIVALPROEX SOD DR 500 MG TA: 500 | 30 days supply | Qty: 60 | Fill #0

## 2016-10-07 MED FILL — AMLODIPINE BESYLATE 5 MG TA: 5 | 30 days supply | Qty: 30 | Fill #0

## 2016-10-14 ENCOUNTER — Other Ambulatory Visit: Payer: Self-pay | Admitting: Family Medicine

## 2016-10-14 DIAGNOSIS — M79672 Pain in left foot: Secondary | ICD-10-CM

## 2016-10-18 ENCOUNTER — Ambulatory Visit: Payer: Self-pay | Admitting: Family Medicine

## 2016-10-18 MED FILL — IBUPROFEN 800 MG TABLET: 800 | 10 days supply | Qty: 30 | Fill #0

## 2016-10-21 MED FILL — FUROSEMIDE 40 MG TABLET: 40 | 60 days supply | Qty: 30 | Fill #1

## 2016-10-21 MED FILL — CARVEDILOL 12.5 MG TABLET: 12.5 | 30 days supply | Qty: 60 | Fill #3

## 2016-10-28 ENCOUNTER — Other Ambulatory Visit: Payer: Self-pay | Admitting: Internal Medicine

## 2016-10-28 DIAGNOSIS — E0842 Diabetes mellitus due to underlying condition with diabetic polyneuropathy: Secondary | ICD-10-CM

## 2016-10-28 MED FILL — AMLODIPINE BESYLATE 5 MG TA: 5 | 30 days supply | Qty: 30 | Fill #1

## 2016-10-28 MED FILL — glipiZIDE ER 10 MG TB24: 10 | 30 days supply | Qty: 30 | Fill #6

## 2016-10-28 MED FILL — metFORMIN HCL 1000 MG TABS: 1000 | 30 days supply | Qty: 60 | Fill #1

## 2016-10-28 MED FILL — GABAPENTIN 100 MG CAPSULE: 100 | 30 days supply | Qty: 90 | Fill #0

## 2016-10-28 MED FILL — ATORVASTATIN 80 MG TABLET: 80 | 30 days supply | Qty: 30 | Fill #2

## 2016-10-28 MED FILL — LISINOPRIL 40 MG TABLET: 40 | 30 days supply | Qty: 30 | Fill #1

## 2016-10-28 MED FILL — PANTOPRAZOLE SOD DR 40 MG T: 40 | 30 days supply | Qty: 30 | Fill #1

## 2016-10-30 ENCOUNTER — Encounter: Payer: Self-pay | Admitting: Family Medicine

## 2016-11-04 ENCOUNTER — Ambulatory Visit (INDEPENDENT_AMBULATORY_CARE_PROVIDER_SITE_OTHER): Payer: No Typology Code available for payment source | Admitting: Psychiatry

## 2016-11-04 ENCOUNTER — Encounter (HOSPITAL_COMMUNITY): Payer: Self-pay | Admitting: Psychiatry

## 2016-11-04 DIAGNOSIS — F5104 Psychophysiologic insomnia: Secondary | ICD-10-CM

## 2016-11-04 DIAGNOSIS — Z818 Family history of other mental and behavioral disorders: Secondary | ICD-10-CM

## 2016-11-04 DIAGNOSIS — Z810 Family history of intellectual disabilities: Secondary | ICD-10-CM

## 2016-11-04 DIAGNOSIS — F418 Other specified anxiety disorders: Secondary | ICD-10-CM

## 2016-11-04 MED ORDER — ARIPIPRAZOLE 2 MG PO TABS: 4.0000 mg | ORAL_TABLET | Freq: Every day | ORAL | 1 refills | Status: DC

## 2016-11-04 MED ORDER — TRAZODONE HCL 100 MG PO TABS: 200.0000 mg | ORAL_TABLET | Freq: Every day | ORAL | 1 refills | Status: DC

## 2016-11-04 MED ORDER — FLUOXETINE HCL 40 MG PO CAPS: 40.0000 mg | ORAL_CAPSULE | Freq: Every day | ORAL | 1 refills | Status: DC

## 2016-11-04 MED FILL — ARIPiprazole 2 MG TABS: 2 | 30 days supply | Qty: 60 | Fill #0

## 2016-11-04 MED FILL — traZODone HCL 100 MG TABS: 100 | 30 days supply | Qty: 60 | Fill #0

## 2016-11-04 MED FILL — FLUoxetine HCL 40 MG CAPS: 40 | 30 days supply | Qty: 30 | Fill #0

## 2016-11-04 NOTE — Patient Instructions (Signed)
Take 1/2 tablet of depakote for 1 week, then stop  Start Abilify 2 mg daily for 1 week, then increase to 4 mg daily if you feel like you need to  Incraese prozac to 40 mg daily

## 2016-11-04 NOTE — Progress Notes (Signed)
Psychiatric Initial Adult Assessment   Patient Identification: Shane [name] MRN:  [id number] Date of Evaluation:  11/04/2016 Referral Source: self Chief Complaint:   Chief Complaint    Anxiety; Depression     Visit Diagnosis:    ICD-9-CM ICD-10-CM   1. Psychophysiological insomnia 307.42 F51.04 traZODone (DESYREL) 100 MG tablet     FLUoxetine (PROZAC) 40 MG capsule  2. Depression with anxiety 300.4 F41.8 FLUoxetine (PROZAC) 40 MG capsule     ARIPiprazole (ABILIFY) 2 MG tablet    History of Present Illness:  Shane [name] presents today for psychiatric evaluation.  He has a history of depression and self-reported bipolar disorder, has been recently followed by [facility] behavioral. I spent time reviewing the patient's history of bipolar disorder, and it appears that it's most consistent with mood lability not attributable to a bipolar illness. The patient agrees with this, and reports that that has been a significant concern of his.  Spent time reviewing his ongoing mood symptoms, namely poor self-esteem, depression, difficulty expressing himself when angry or irritable, sleep difficulties, decreased interest in activities, and passive thoughts that he would be better off dead. He denies any intention to harm himself. He has never been psychiatrically hospitalized. He denies any alcohol or drug use.  To struggle with up-and-down mood throughout the day, particularly if he feels like he is being stifled or offended. He reports that he and his wife of 3 years (this is his third wife) have had a rocky relationship due to their communication difficulties. He reports that this is his wife's fourth marriage. He admits that they both have issues and anger and expressing themselves.  He is agreeable to participating in therapy to work on his ability to express dissatisfaction and frustration.  Regarding medications, he feels like Prozac has been helpful in part. He feels like Depakote just makes him  tired and groggy and has contributed to weight gain. He takes one 500 mg tablet at night, and we agreed to taper and discontinue. We agreed to increase Prozac to 40 mg, and initiate Abilify 2 mg daily to augment. He continues to take trazodone 100-200 mg at night for sleep.    Associated Signs/Symptoms: Depression Symptoms:  depressed mood, anhedonia, insomnia, hypersomnia, psychomotor agitation, psychomotor retardation, fatigue, feelings of worthlessness/guilt, difficulty concentrating, hopelessness, impaired memory, recurrent thoughts of death, (Hypo) Manic Symptoms:  Irritable Mood, Anxiety Symptoms:  Social Anxiety, Psychotic Symptoms:  none PTSD Symptoms: Negative  Past Psychiatric History: No psychiatric hospitalizations. Outpatient psychiatric management   Previous Psychotropic Medications: Yes   Substance Abuse History in the last 12 months:  No.  Consequences of Substance Abuse: Negative  Past Medical History:  Past Medical History:  Diagnosis Date  . Anemia   . Bipolar disorder ([place])   . Cellulitis and abscess of left leg 06/2016  . Chest pain    a. 2015 Reportedly normal stress test in [place].  Marland Kitchen Chronic diastolic CHF (congestive heart failure) (HCC)    a.03/2015 Echo: EF 55-60%, Gr 1 DD, mild MR, triv PR.  . Depression   . Depression with anxiety   . Dyspnea    with exertion  . GERD (gastroesophageal reflux disease)   . Hyperlipidemia   . Hypertension    a. 08/2014 Admitted with hypertensive urgency.  . Insomnia   . Internal carotid artery stenosis   . Lower GI bleed 07/04/2015  . Neuropathy   . Refusal of blood transfusions as patient is Jehovah's Witness   . TIA (transient ischemic attack)  08/2014; 03/2015   a. 08/2014 in setting of hypertensive urgency.  . Type II diabetes mellitus ([place])    started insulin spring 2016, Type II  . Vitamin D deficiency spring 2016    Past Surgical History:  Procedure Laterality Date  . AMPUTATION Left 08/03/2015    Procedure: AMPUTATION LEFT GREAT TOE;  Surgeon: Newt Minion, MD;  Location: [place];  Service: Orthopedics;  Laterality: Left;  . AMPUTATION Left 12/02/2015   Procedure: amputation of left 2nd digit  . AMPUTATION Left 04/10/2016   Procedure: Left 2nd Toe Amputation at MTP Joint;  Surgeon: Newt Minion, MD;  Location: [place];  Service: Orthopedics;  Laterality: Left;  . AMPUTATION Left 06/09/2016   Procedure: AMPUTATION LEFT THIRD TOE;  Surgeon: [name], MD;  Location: [place];  Service: Orthopedics;  Laterality: Left;  . AMPUTATION Left 06/26/2016   Procedure: Left Foot Transmetatarsal Amputation;  Surgeon: Newt Minion, MD;  Location: [place];  Service: Orthopedics;  Laterality: Left;  . APPLICATION OF WOUND VAC Left 12/19/2015   Procedure: APPLICATION OF WOUND VAC;  Surgeon: Meredith Pel, MD;  Location: [place];  Service: Orthopedics;  Laterality: Left;  . CIRCUMCISION    . COLONOSCOPY N/A 01/22/2016   Procedure: COLONOSCOPY;  Surgeon: Gatha Mayer, MD;  Location: [place];  Service: Endoscopy;  Laterality: N/A;  . ESOPHAGOGASTRODUODENOSCOPY N/A 01/19/2016   Procedure: ESOPHAGOGASTRODUODENOSCOPY (EGD);  Surgeon: Manus Gunning, MD;  Location: [place];  Service: Gastroenterology;  Laterality: N/A;  . ESOPHAGOGASTRODUODENOSCOPY N/A 01/22/2016   Procedure: ESOPHAGOGASTRODUODENOSCOPY (EGD);  Surgeon: Gatha Mayer, MD;  Location[facility]COPY;  Service: Endoscopy;  Laterality: N/A;  . I&D EXTREMITY Left 12/02/2015   Procedure: IRRIGATION AND DEBRIDEMENT OF FOOT; LEFT SECOND TOE AMPUTATION;  Surgeon: Meredith Pel, MD;  Location: [place];  Service: Orthopedics;  Laterality: Left;  . I&D EXTREMITY Left 12/19/2015   Procedure: I & D LEFT FOOT WITH BEADS ;  Surgeon: Meredith Pel, MD;  Location: [place];  Service: Orthopedics;  Laterality: Left;  . I&D EXTREMITY Right 01/17/2016   Procedure: IRRIGATION AND DEBRIDEMENT RIGHT FOOT;  Surgeon: Newt Minion, MD;  Location: [place];   Service: Orthopedics;  Laterality: Right;  . INCISION AND DRAINAGE FOOT Right 01/17/2016  . INGUINAL HERNIA REPAIR Bilateral ~ 1983- ~ 1986  . TONSILLECTOMY  ~ 48    Family Psychiatric History: below  Family History:  Family History  Problem Relation Age of Onset  . Hypertension Mother   . Diabetes Mother   . Hyperlipidemia Mother   . Heart disease Mother        s/p pacemaker  . Diabetes Father   . Hypertension Father   . Stroke Father   . Heart attack Father        first MI @ 45.  . Depression Father   . Dementia Father   . Stroke Brother   . ADD / ADHD Brother   . Anxiety disorder Brother   . Bipolar disorder Brother   . OCD Brother   . Sexual abuse Brother     Social History:   Social History   Social History  . Marital status: Married    Spouse name: N/A  . Number of children: N/A  . Years of education: N/A   Social History Main Topics  . Smoking status: Never Smoker  . Smokeless tobacco: Never Used  . Alcohol use No  . Drug use: No  . Sexual activity:  No   Other Topics Concern  . None   Social History Narrative   Lives in [place] with wife.  Active but doesn't routinely exercise.    Additional Social History: disabled due to medical issues  Allergies:   Allergies  Allergen Reactions  . Lactose Intolerance (Gi) Diarrhea and Other (See Comments)    Bloating (also)  . Other Other (See Comments)    Red meat causes stomach pains, bloating and diarrhea  . Milk-Related Compounds Diarrhea and Other (See Comments)    Any dairy products  - diarrhea and bloating    Metabolic Disorder Labs: Lab Results  Component Value Date   HGBA1C 7.6 08/23/2016   MPG 203 06/08/2016   MPG 177 03/09/2016   No results found for: PROLACTIN Lab Results  Component Value Date   CHOL 101 03/09/2016   TRIG 79 03/09/2016   HDL 31 (L) 03/09/2016   CHOLHDL 3.3 03/09/2016   VLDL 16 03/09/2016   LDLCALC 54 03/09/2016   LDLCALC 58 12/11/2015     Current  Medications: Current Outpatient Prescriptions  Medication Sig Dispense Refill  . ACCU-CHEK SOFTCLIX LANCETS lancets Use as instructed 100 each 12  . amLODipine (NORVASC) 5 MG tablet TAKE 1 TABLET (5 MG TOTAL) BY MOUTH DAILY. 30 tablet 3  . aspirin EC 81 MG tablet Take 1 tablet (81 mg total) by mouth daily. 30 tablet 11  . atorvastatin (LIPITOR) 80 MG tablet TAKE 1 TABLET BY MOUTH EVERY MORNING. 30 tablet 5  . Blood Glucose Monitoring Suppl (ACCU-CHEK AVIVA PLUS) w/Device KIT Use as directed 1 kit 0  . carvedilol (COREG) 12.5 MG tablet Take 12.5 mg by mouth 2 (two) times daily.    . divalproex (DEPAKOTE) 500 MG DR tablet Take 2 tablets (1,000 mg total) by mouth at bedtime. 60 tablet 2  . Elastic Bandages & Supports (T.E.D. KNEE LENGTH/S-REGULAR) MISC Wear when awake 1 each 0  . exenatide (BYETTA 10 MCG PEN) 10 MCG/0.04ML SOPN injection Inject 0.04 mLs (10 mcg total) into the skin 2 (two) times daily with a meal. 2.4 mL 5  . fluticasone (FLONASE) 50 MCG/ACT nasal spray Place 2 sprays into both nostrils daily. 16 g 0  . folic acid (FOLVITE) 1 MG tablet Take 1 tablet (1 mg total) by mouth daily. 30 tablet 3  . furosemide (LASIX) 20 MG tablet Take 1 tablet (20 mg total) by mouth daily. 30 tablet 2  . gabapentin (NEURONTIN) 100 MG capsule TAKE 1 CAPSULE BY MOUTH 3 TIMES DAILY. 90 capsule 3  . glipiZIDE (GLUCOTROL XL) 10 MG 24 hr tablet Take 2 tablets (20 mg total) by mouth daily with breakfast. 60 tablet 5  . glucose blood (ACCU-CHEK AVIVA PLUS) test strip Use as instructed 100 each 12  . HYDROcodone-acetaminophen (NORCO/VICODIN) 5-325 MG tablet Take 1 tablet by mouth every 4 (four) hours as needed for moderate pain. 10 tablet 0  . ibuprofen (ADVIL,MOTRIN) 800 MG tablet TAKE 1 TABLET BY MOUTH EVERY 8 HOURS AS NEEDED. 30 tablet 0  . Insulin Detemir (LEVEMIR FLEXPEN) 100 UNIT/ML Pen Inject 24 Units into the skin every morning. 6 mL 0  . Insulin Pen Needle (B-D ULTRAFINE III SHORT PEN) 31G X 8 MM MISC 1  application by Does not apply route 3 (three) times daily. 90 each 11  . Lancet Devices (ACCU-CHEK SOFTCLIX) lancets Use as instructed 1 each 0  . lisinopril (PRINIVIL,ZESTRIL) 40 MG tablet TAKE 1 TABLET BY MOUTH DAILY. 30 tablet 2  . loratadine (CLARITIN) 10  MG tablet Take 1 tablet (10 mg total) by mouth daily. 30 tablet 0  . metFORMIN (GLUCOPHAGE) 1000 MG tablet TAKE 1 TABLET BY MOUTH 2 TIMES DAILY WITH A MEAL. 60 tablet 2  . metoCLOPramide (REGLAN) 5 MG tablet Take 1 tablet (5 mg total) by mouth 3 (three) times daily before meals. 90 tablet 2  . ondansetron (ZOFRAN ODT) 8 MG disintegrating tablet Take 1 tablet (8 mg total) by mouth every 8 (eight) hours as needed for nausea or vomiting. 30 tablet 0  . pantoprazole (PROTONIX) 40 MG tablet TAKE 1 TABLET BY MOUTH DAILY. 30 tablet 2  . polyethylene glycol (MIRALAX / GLYCOLAX) packet Take 17 g by mouth daily as needed for moderate constipation. 14 each 0  . ranitidine (ZANTAC) 300 MG tablet Take 1 tablet (300 mg total) by mouth at bedtime. 30 tablet 5  . senna-docusate (SENOKOT-S) 8.6-50 MG tablet Take 1 tablet by mouth at bedtime. (Patient taking differently: Take 1 tablet by mouth at bedtime as needed for mild constipation. ) 60 tablet 2  . traZODone (DESYREL) 100 MG tablet Take 2 tablets (200 mg total) by mouth at bedtime. 90 tablet 1  . vitamin B-12 1000 MCG tablet Take 1 tablet (1,000 mcg total) by mouth daily. 30 tablet 3  . Vitamin D, Ergocalciferol, (DRISDOL) 50000 units CAPS capsule Take 1 capsule (50,000 Units total) by mouth every 30 (thirty) days. Every 30 days (Patient taking differently: Take 50,000 Units by mouth every 30 (thirty) days. ) 12 capsule 1  . ARIPiprazole (ABILIFY) 2 MG tablet Take 2 tablets (4 mg total) by mouth daily. 90 tablet 1  . FLUoxetine (PROZAC) 40 MG capsule Take 1 capsule (40 mg total) by mouth daily. 90 capsule 1  . furosemide (LASIX) 40 MG tablet Take 1 tablet (40 mg total) by mouth daily. (Patient not taking:  Reported on 11/04/2016) 7 tablet 0  . LEVEMIR FLEXPEN 100 UNIT/ML Pen   0   No current facility-administered medications for this visit.     Neurologic: Headache: Negative Seizure: Negative Paresthesias:Negative  Musculoskeletal: Strength & Muscle Tone: within normal limits Gait & Station: normal Patient leans: N/A  Walks with cane  Psychiatric Specialty Exam: Review of Systems  Constitutional: Negative.   HENT: Negative.   Cardiovascular: Negative.   Gastrointestinal: Negative.   Musculoskeletal: Positive for joint pain.  Neurological: Positive for tingling.  Psychiatric/Behavioral: Positive for depression. The patient is nervous/anxious and has insomnia.     Blood pressure 108/78, pulse 80, height 5' 7.5" (1.715 m), weight 196 lb (88.9 kg), SpO2 95 %.Body mass index is 30.24 kg/m.  General Appearance: Casual and Fairly Groomed  Eye Contact:  Good  Speech:  Normal Rate  Volume:  Normal  Mood:  Depressed and Dysphoric  Affect:  Congruent  Thought Process:  Coherent and Goal Directed  Orientation:  Full (Time, Place, and Person)  Thought Content:  Logical  Suicidal Thoughts:  No  Homicidal Thoughts:  No  Memory:  Immediate;   Fair  Judgement:  Intact  Insight:  Fair  Psychomotor Activity:  Normal  Concentration:  Concentration: Fair  Recall:  NA  Fund of Knowledge:Good  Language: Good  Akathisia:  Negative  Handed:  Right  AIMS (if indicated):  0  Assets:  Communication Skills Desire for Improvement Housing Social Support  ADL's:  Intact  Cognition: WNL  Sleep:  5-6 hours    Treatment Plan Summary: Shane [name] is a 46 year old male with major depressive disorder, with comorbid anxiety.  He has a history of reported bipolar disorder, but based on his clinical history this is not consistent. I believe it is more consistent with mood lability associated with depression symptoms, and a generally poor communication style. I believe he would benefit from a higher  dose of SSRI along with an augmenting dose of Abilify.  We will taper and discontinue Depakote which has been with significant side effects for him. No acute safety issues at this time, and he agrees to follow-up in therapy.  1. Psychophysiological insomnia   2. Depression with anxiety    Prozac increased to 40 mg Depakote taper to discontinue Initiate Abilify 2 mg daily, increase to 4 mg as tolerated Continue trazodone 200 mg nightly for sleep Follow-up in 2-3 months Range for therapy in this clinic  Aundra Dubin, MD 5/21/201811:52 AM

## 2016-11-05 ENCOUNTER — Other Ambulatory Visit: Payer: Self-pay | Admitting: Family Medicine

## 2016-11-05 DIAGNOSIS — I5032 Chronic diastolic (congestive) heart failure: Secondary | ICD-10-CM

## 2016-11-12 ENCOUNTER — Encounter: Payer: Self-pay | Admitting: Family Medicine

## 2016-11-12 ENCOUNTER — Ambulatory Visit: Payer: Self-pay | Attending: Family Medicine | Admitting: Family Medicine

## 2016-11-12 VITALS — BP 119/77 | HR 88 | Temp 97.6°F | Wt 191.8 lb

## 2016-11-12 DIAGNOSIS — F419 Anxiety disorder, unspecified: Secondary | ICD-10-CM | POA: Insufficient documentation

## 2016-11-12 DIAGNOSIS — E1143 Type 2 diabetes mellitus with diabetic autonomic (poly)neuropathy: Secondary | ICD-10-CM

## 2016-11-12 DIAGNOSIS — I10 Essential (primary) hypertension: Secondary | ICD-10-CM | POA: Insufficient documentation

## 2016-11-12 DIAGNOSIS — E118 Type 2 diabetes mellitus with unspecified complications: Secondary | ICD-10-CM

## 2016-11-12 DIAGNOSIS — Z7982 Long term (current) use of aspirin: Secondary | ICD-10-CM | POA: Insufficient documentation

## 2016-11-12 DIAGNOSIS — D649 Anemia, unspecified: Secondary | ICD-10-CM | POA: Insufficient documentation

## 2016-11-12 DIAGNOSIS — K3184 Gastroparesis: Secondary | ICD-10-CM

## 2016-11-12 DIAGNOSIS — F329 Major depressive disorder, single episode, unspecified: Secondary | ICD-10-CM | POA: Insufficient documentation

## 2016-11-12 DIAGNOSIS — IMO0002 Reserved for concepts with insufficient information to code with codable children: Secondary | ICD-10-CM

## 2016-11-12 DIAGNOSIS — R1030 Lower abdominal pain, unspecified: Secondary | ICD-10-CM | POA: Insufficient documentation

## 2016-11-12 DIAGNOSIS — K219 Gastro-esophageal reflux disease without esophagitis: Secondary | ICD-10-CM | POA: Insufficient documentation

## 2016-11-12 DIAGNOSIS — E1165 Type 2 diabetes mellitus with hyperglycemia: Secondary | ICD-10-CM | POA: Insufficient documentation

## 2016-11-12 DIAGNOSIS — K0510 Chronic gingivitis, plaque induced: Secondary | ICD-10-CM

## 2016-11-12 DIAGNOSIS — Z79899 Other long term (current) drug therapy: Secondary | ICD-10-CM | POA: Insufficient documentation

## 2016-11-12 DIAGNOSIS — Z794 Long term (current) use of insulin: Secondary | ICD-10-CM | POA: Insufficient documentation

## 2016-11-12 LAB — GLUCOSE, POCT (MANUAL RESULT ENTRY): POC GLUCOSE: 232 mg/dL — AB (ref 70–99)

## 2016-11-12 LAB — POCT URINALYSIS DIPSTICK
Bilirubin, UA: NEGATIVE
KETONES UA: NEGATIVE
Leukocytes, UA: NEGATIVE
Nitrite, UA: NEGATIVE
SPEC GRAV UA: 1.02 (ref 1.010–1.025)
UROBILINOGEN UA: 0.2 U/dL
pH, UA: 5 (ref 5.0–8.0)

## 2016-11-12 LAB — POCT GLYCOSYLATED HEMOGLOBIN (HGB A1C): Hemoglobin A1C: 8.3

## 2016-11-12 MED ORDER — INSULIN DETEMIR 100 UNIT/ML FLEXPEN: 15.0000 [IU] | PEN_INJECTOR | Freq: Every morning | SUBCUTANEOUS | 3 refills | Status: DC

## 2016-11-12 MED ORDER — ONDANSETRON 8 MG PO TBDP: 8.0000 mg | ORAL_TABLET | Freq: Three times a day (TID) | ORAL | 0 refills | Status: DC | PRN

## 2016-11-12 MED FILL — TRUE METRIX TEST STRIP: 30 days supply | Qty: 100 | Fill #4

## 2016-11-12 NOTE — Patient Instructions (Addendum)
Shane [name] was seen today for diabetes.  Diagnoses and all orders for this visit:  Uncontrolled type 2 diabetes mellitus with complication, with long-term current use of insulin (HCC) -     POCT glucose (manual entry) -     POCT glycosylated hemoglobin (Hb A1C) -     POCT urinalysis dipstick -     Insulin Detemir (LEVEMIR FLEXPEN) 100 UNIT/ML Pen; Inject 15 Units into the skin every morning.  Gastroesophageal reflux disease without esophagitis -     ondansetron (ZOFRAN ODT) 8 MG disintegrating tablet; Take 1 tablet (8 mg total) by mouth every 8 (eight) hours as needed for nausea or vomiting.  Lower abdominal pain -     Urinalysis, microscopic only -     Urine culture   If sugars are still elevated increase levemir to 20 U daily  Diabetes blood sugar goals  Fasting (in AM before breakfast, 8 hrs of no eating or drinking (except water or unsweetened coffee or tea): 90-130 2 hrs after meals: < 160,   No low sugars: nothing < 70   You can take ibuprofen 400 mg twice daily with food for pain   F/u in 6 weeks for diabetes  Dr. Adrian Blackwater

## 2016-11-12 NOTE — Progress Notes (Signed)
Subjective:  Patient ID: Shane [name], male    DOB: 1970-11-25  Age: 46 y.o. MRN: [id number]  CC: Diabetes   HPI Leamon Palau has diabetes with complications, s/p L transmetatarsal amputation, anemia, HTN, depression and anxiety, chronic diastolic dysfunction he presents for  presents for   1. CHRONIC DIABETES Disease Monitoring  Blood Sugar Ranges:   Fasting: 159   Postprandial: 300s  Polyuria: yes   Visual problems: yes   Medication Compliance: yes ,taking levemir increase to 10 U daily, metformin and glipizide. Not taking byetta.  Medication Side Effects  Hypoglycemia: no, lowest CBG 90   Preventitive Health Care  Eye Exam: due    He reports elevated blood sugar, abdominal pain, nausea and decreased appetite since starting Abilify 2 weeks ago. His Reglan was stopped by his psychiatrist due avoid interaction with Abilify. His depakote was tapered off. His prozac dose was increased.  He denies fever. He has chronic chills. No emesis.    Social History  Substance Use Topics  . Smoking status: Never Smoker  . Smokeless tobacco: Never Used  . Alcohol use No    Outpatient Medications Prior to Visit  Medication Sig Dispense Refill  . ACCU-CHEK SOFTCLIX LANCETS lancets Use as instructed 100 each 12  . amLODipine (NORVASC) 5 MG tablet TAKE 1 TABLET (5 MG TOTAL) BY MOUTH DAILY. 30 tablet 3  . ARIPiprazole (ABILIFY) 2 MG tablet Take 2 tablets (4 mg total) by mouth daily. 90 tablet 1  . aspirin EC 81 MG tablet Take 1 tablet (81 mg total) by mouth daily. 30 tablet 11  . atorvastatin (LIPITOR) 80 MG tablet TAKE 1 TABLET BY MOUTH EVERY MORNING. 30 tablet 5  . Blood Glucose Monitoring Suppl (ACCU-CHEK AVIVA PLUS) w/Device KIT Use as directed 1 kit 0  . carvedilol (COREG) 12.5 MG tablet Take 12.5 mg by mouth 2 (two) times daily.    . divalproex (DEPAKOTE) 500 MG DR tablet Take 2 tablets (1,000 mg total) by mouth at bedtime. 60 tablet 2  . Elastic Bandages & Supports (T.E.D. KNEE  LENGTH/S-REGULAR) MISC Wear when awake 1 each 0  . exenatide (BYETTA 10 MCG PEN) 10 MCG/0.04ML SOPN injection Inject 0.04 mLs (10 mcg total) into the skin 2 (two) times daily with a meal. 2.4 mL 5  . FLUoxetine (PROZAC) 40 MG capsule Take 1 capsule (40 mg total) by mouth daily. 90 capsule 1  . fluticasone (FLONASE) 50 MCG/ACT nasal spray Place 2 sprays into both nostrils daily. 16 g 0  . folic acid (FOLVITE) 1 MG tablet Take 1 tablet (1 mg total) by mouth daily. 30 tablet 3  . furosemide (LASIX) 40 MG tablet Take 1 tablet (40 mg total) by mouth daily. (Patient not taking: Reported on 11/04/2016) 7 tablet 0  . furosemide (LASIX) 40 MG tablet TAKE 1 TABLET BY MOUTH EVERY OTHER DAY. 30 tablet 1  . gabapentin (NEURONTIN) 100 MG capsule TAKE 1 CAPSULE BY MOUTH 3 TIMES DAILY. 90 capsule 3  . glipiZIDE (GLUCOTROL XL) 10 MG 24 hr tablet Take 2 tablets (20 mg total) by mouth daily with breakfast. 60 tablet 5  . glucose blood (ACCU-CHEK AVIVA PLUS) test strip Use as instructed 100 each 12  . HYDROcodone-acetaminophen (NORCO/VICODIN) 5-325 MG tablet Take 1 tablet by mouth every 4 (four) hours as needed for moderate pain. 10 tablet 0  . ibuprofen (ADVIL,MOTRIN) 800 MG tablet TAKE 1 TABLET BY MOUTH EVERY 8 HOURS AS NEEDED. 30 tablet 0  . Insulin Detemir (LEVEMIR FLEXPEN)  100 UNIT/ML Pen Inject 24 Units into the skin every morning. 6 mL 0  . Insulin Pen Needle (B-D ULTRAFINE III SHORT PEN) 31G X 8 MM MISC 1 application by Does not apply route 3 (three) times daily. 90 each 11  . Lancet Devices (ACCU-CHEK SOFTCLIX) lancets Use as instructed 1 each 0  . LEVEMIR FLEXPEN 100 UNIT/ML Pen   0  . lisinopril (PRINIVIL,ZESTRIL) 40 MG tablet TAKE 1 TABLET BY MOUTH DAILY. 30 tablet 2  . loratadine (CLARITIN) 10 MG tablet Take 1 tablet (10 mg total) by mouth daily. 30 tablet 0  . metFORMIN (GLUCOPHAGE) 1000 MG tablet TAKE 1 TABLET BY MOUTH 2 TIMES DAILY WITH A MEAL. 60 tablet 2  . metoCLOPramide (REGLAN) 5 MG tablet Take 1  tablet (5 mg total) by mouth 3 (three) times daily before meals. 90 tablet 2  . ondansetron (ZOFRAN ODT) 8 MG disintegrating tablet Take 1 tablet (8 mg total) by mouth every 8 (eight) hours as needed for nausea or vomiting. 30 tablet 0  . pantoprazole (PROTONIX) 40 MG tablet TAKE 1 TABLET BY MOUTH DAILY. 30 tablet 2  . polyethylene glycol (MIRALAX / GLYCOLAX) packet Take 17 g by mouth daily as needed for moderate constipation. 14 each 0  . ranitidine (ZANTAC) 300 MG tablet Take 1 tablet (300 mg total) by mouth at bedtime. 30 tablet 5  . senna-docusate (SENOKOT-S) 8.6-50 MG tablet Take 1 tablet by mouth at bedtime. (Patient taking differently: Take 1 tablet by mouth at bedtime as needed for mild constipation. ) 60 tablet 2  . traZODone (DESYREL) 100 MG tablet Take 2 tablets (200 mg total) by mouth at bedtime. 90 tablet 1  . vitamin B-12 1000 MCG tablet Take 1 tablet (1,000 mcg total) by mouth daily. 30 tablet 3  . Vitamin D, Ergocalciferol, (DRISDOL) 50000 units CAPS capsule Take 1 capsule (50,000 Units total) by mouth every 30 (thirty) days. Every 30 days (Patient taking differently: Take 50,000 Units by mouth every 30 (thirty) days. ) 12 capsule 1   No facility-administered medications prior to visit.     ROS Review of Systems  Constitutional: Positive for appetite change and chills. Negative for fatigue, fever and unexpected weight change.  Eyes: Negative for visual disturbance.  Respiratory: Negative for cough and shortness of breath.   Cardiovascular: Negative for chest pain, palpitations and leg swelling.  Gastrointestinal: Positive for abdominal pain and nausea. Negative for blood in stool, diarrhea and vomiting.  Endocrine: Negative for polydipsia, polyphagia and polyuria.  Musculoskeletal: Negative for arthralgias, back pain, gait problem, myalgias and neck pain.  Skin: Negative for rash.  Allergic/Immunologic: Negative for immunocompromised state.  Neurological: Negative for  light-headedness.  Hematological: Negative for adenopathy. Does not bruise/bleed easily.  Psychiatric/Behavioral: Negative for dysphoric mood, sleep disturbance and suicidal ideas. The patient is not nervous/anxious.     Objective:  BP 119/77   Pulse 88   Temp 97.6 F (36.4 C) (Oral)   Wt 191 lb 12.8 oz (87 kg)   SpO2 96%   BMI 29.60 kg/m   BP/Weight 11/12/2016 6/0/6301 6/0/1093  Systolic BP 235 573 220  Diastolic BP 77 97 84  Wt. (Lbs) 191.8 - 195.2  BMI 29.6 - 30.57  Some encounter information is confidential and restricted. Go to Review Flowsheets activity to see all data.    Physical Exam  Constitutional: He appears well-developed and well-nourished. No distress.  HENT:  Head: Normocephalic and atraumatic.  Mouth/Throat:    Neck: Normal range of motion.  Neck supple.  Cardiovascular: Normal rate, regular rhythm, normal heart sounds and intact distal pulses.   Pulmonary/Chest: Effort normal and breath sounds normal.  Abdominal: There is generalized tenderness. There is CVA tenderness. There is no rigidity, no rebound, no guarding, no tenderness at McBurney's point and negative Murphy's sign.  Musculoskeletal:       Feet:  Neurological: He is alert.  Skin: Skin is warm and dry. No rash noted. No erythema.  Psychiatric: He has a normal mood and affect.   Lab Results  Component Value Date   HGBA1C 7.6 08/23/2016   Lab Results  Component Value Date   HGBA1C 8.3 11/12/2016    CBG 232 UA:  250 glucose, 100 protein, small RBC U micro 0-2 RBC, no bacteria U culture negative   Assessment & Plan:  Nicoles was seen today for diabetes.  Diagnoses and all orders for this visit:  Uncontrolled type 2 diabetes mellitus with complication, with long-term current use of insulin (HCC) -     POCT glucose (manual entry) -     POCT glycosylated hemoglobin (Hb A1C) -     POCT urinalysis dipstick -     Insulin Detemir (LEVEMIR FLEXPEN) 100 UNIT/ML Pen; Inject 15 Units into the  skin every morning.  Gastroesophageal reflux disease without esophagitis -     ondansetron (ZOFRAN ODT) 8 MG disintegrating tablet; Take 1 tablet (8 mg total) by mouth every 8 (eight) hours as needed for nausea or vomiting.  Lower abdominal pain -     Urinalysis, microscopic only -     Urine culture   There are no diagnoses linked to this encounter.  No orders of the defined types were placed in this encounter.   Follow-up: No Follow-up on file.   Boykin Nearing MD

## 2016-11-13 LAB — URINALYSIS, MICROSCOPIC ONLY
BACTERIA UA: NONE SEEN
CASTS: NONE SEEN /LPF
Epithelial Cells (non renal): NONE SEEN /hpf (ref 0–10)

## 2016-11-14 DIAGNOSIS — R109 Unspecified abdominal pain: Secondary | ICD-10-CM | POA: Insufficient documentation

## 2016-11-14 DIAGNOSIS — K0510 Chronic gingivitis, plaque induced: Secondary | ICD-10-CM | POA: Insufficient documentation

## 2016-11-14 LAB — URINE CULTURE: Organism ID, Bacteria: NO GROWTH

## 2016-11-14 MED ORDER — AMOXICILLIN-POT CLAVULANATE 875-125 MG PO TABS: 1.0000 | ORAL_TABLET | Freq: Two times a day (BID) | ORAL | 0 refills | Status: DC

## 2016-11-14 NOTE — Assessment & Plan Note (Signed)
Pain without mass, fever, emesis Negative urine culture Suspect gastroparesis and hyperglycemia as major cause Also Abilify and increased Prozac doses would cause GI upset initially  Treat blood sugar refilled Zofran

## 2016-11-14 NOTE — Assessment & Plan Note (Signed)
Broken molar with gingivitis elevated CBGs Will treat with Augmentin x 10 day course  Dental referral

## 2016-11-14 NOTE — Assessment & Plan Note (Signed)
reglan stopped with abilify started Plan: zofran

## 2016-11-14 NOTE — Assessment & Plan Note (Signed)
A; decline in control P: Increase levemir to 15 U daily Continue metformin 1000 mg BID Continue glipizide XL 20 mg  Daily

## 2016-11-18 ENCOUNTER — Ambulatory Visit (HOSPITAL_COMMUNITY): Payer: Self-pay | Admitting: Psychiatry

## 2016-11-19 MED FILL — ?FUROSEMIDE 40 MG TABLET: 40 | 30 days supply | Qty: 15 | Fill #0

## 2016-11-20 ENCOUNTER — Telehealth: Payer: Self-pay

## 2016-11-20 NOTE — Telephone Encounter (Signed)
Pt was called and informed of lab results. 

## 2016-11-25 MED FILL — traZODone HCL 100 MG TABS: 100 | 30 days supply | Qty: 60 | Fill #1

## 2016-11-27 ENCOUNTER — Other Ambulatory Visit: Payer: Self-pay | Admitting: Family Medicine

## 2016-11-27 DIAGNOSIS — I10 Essential (primary) hypertension: Secondary | ICD-10-CM

## 2016-11-27 DIAGNOSIS — K219 Gastro-esophageal reflux disease without esophagitis: Secondary | ICD-10-CM

## 2016-11-27 MED FILL — LISINOPRIL 40 MG TABLET: 40 | 30 days supply | Qty: 30 | Fill #2

## 2016-11-27 MED FILL — ATORVASTATIN 80 MG TABLET: 80 | 30 days supply | Qty: 30 | Fill #3

## 2016-11-27 MED FILL — ?PANTOPRAZOLE SOD DR 40MG: 40 MG | 30 days supply | Qty: 30 | Fill #2

## 2016-11-27 MED FILL — glipiZIDE ER 10 MG TB24: 10 | 30 days supply | Qty: 30 | Fill #7

## 2016-11-27 MED FILL — AMLODIPINE BESYLATE 5 MG TA: 5 | 30 days supply | Qty: 30 | Fill #2

## 2016-11-28 ENCOUNTER — Ambulatory Visit (INDEPENDENT_AMBULATORY_CARE_PROVIDER_SITE_OTHER): Payer: No Typology Code available for payment source | Admitting: Licensed Clinical Social Worker

## 2016-11-28 ENCOUNTER — Encounter (HOSPITAL_COMMUNITY): Payer: Self-pay | Admitting: Licensed Clinical Social Worker

## 2016-11-28 DIAGNOSIS — F418 Other specified anxiety disorders: Secondary | ICD-10-CM
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
## 2016-12-03 ENCOUNTER — Other Ambulatory Visit: Payer: Self-pay | Admitting: Family Medicine

## 2016-12-03 MED FILL — ?METFORMIN HCL 1,000 MG TAB: 1000 | 30 days supply | Qty: 60 | Fill #2

## 2016-12-04 MED FILL — ?FUROSEMIDE 40 MG TABLET: 40 | 30 days supply | Qty: 15 | Fill #1

## 2016-12-04 MED FILL — FLUoxetine HCL 40 MG CAPS: 40 | 30 days supply | Qty: 30 | Fill #1

## 2016-12-11 ENCOUNTER — Telehealth: Payer: Self-pay | Admitting: Family Medicine

## 2016-12-11 ENCOUNTER — Other Ambulatory Visit: Payer: Self-pay | Admitting: Family Medicine

## 2016-12-11 NOTE — Telephone Encounter (Signed)
PT called to speak with the nurse since his blood count is low and he is getting tired, please follow up

## 2016-12-17 ENCOUNTER — Ambulatory Visit (HOSPITAL_COMMUNITY): Payer: Self-pay | Admitting: Psychiatry

## 2016-12-17 ENCOUNTER — Ambulatory Visit (INDEPENDENT_AMBULATORY_CARE_PROVIDER_SITE_OTHER): Payer: Self-pay | Admitting: Orthopedic Surgery

## 2016-12-17 ENCOUNTER — Other Ambulatory Visit: Payer: Self-pay | Admitting: Family Medicine

## 2016-12-17 DIAGNOSIS — I10 Essential (primary) hypertension: Secondary | ICD-10-CM

## 2016-12-17 DIAGNOSIS — K219 Gastro-esophageal reflux disease without esophagitis: Secondary | ICD-10-CM

## 2016-12-30 ENCOUNTER — Other Ambulatory Visit: Payer: Self-pay | Admitting: Family Medicine

## 2016-12-30 ENCOUNTER — Other Ambulatory Visit (HOSPITAL_COMMUNITY): Payer: Self-pay | Admitting: Psychiatry

## 2016-12-30 DIAGNOSIS — I10 Essential (primary) hypertension: Secondary | ICD-10-CM

## 2016-12-30 DIAGNOSIS — F5104 Psychophysiologic insomnia: Secondary | ICD-10-CM

## 2016-12-30 MED FILL — ATORVASTATIN 80 MG TABLET: 80 | 30 days supply | Qty: 30 | Fill #4

## 2016-12-30 MED FILL — FUROSEMIDE 40 MG TABLET: 40 | 30 days supply | Qty: 15 | Fill #2

## 2016-12-30 MED FILL — FLUoxetine HCL 40 MG CAPS: 40 | 30 days supply | Qty: 30 | Fill #2

## 2016-12-30 MED FILL — AMLODIPINE BESYLATE 5 MG TA: 5 | 30 days supply | Qty: 30 | Fill #3

## 2016-12-30 MED FILL — traZODone HCL 100 MG TABS: 100 | 30 days supply | Qty: 60 | Fill #2

## 2016-12-30 MED FILL — ?METFORMIN HCL 1,000 MG TAB: 1000 | 30 days supply | Qty: 60 | Fill #0

## 2016-12-30 MED FILL — ?PANTOPRAZOLE SOD DR 40MG: 40 MG | 30 days supply | Qty: 30 | Fill #0

## 2016-12-30 MED FILL — glipiZIDE ER 10 MG TB24: 10 | 30 days supply | Qty: 30 | Fill #8

## 2016-12-31 MED FILL — CARVEDILOL 12.5 MG TABLET: 12.5 | 30 days supply | Qty: 60 | Fill #0

## 2017-01-09 ENCOUNTER — Other Ambulatory Visit: Payer: Self-pay | Admitting: Family Medicine

## 2017-01-09 ENCOUNTER — Ambulatory Visit (INDEPENDENT_AMBULATORY_CARE_PROVIDER_SITE_OTHER): Payer: No Typology Code available for payment source | Admitting: Psychiatry

## 2017-01-09 VITALS — BP 126/78 | HR 94 | Ht 68.0 in | Wt 195.6 lb

## 2017-01-09 DIAGNOSIS — F418 Other specified anxiety disorders: Secondary | ICD-10-CM

## 2017-01-09 DIAGNOSIS — Z818 Family history of other mental and behavioral disorders: Secondary | ICD-10-CM

## 2017-01-09 DIAGNOSIS — M79672 Pain in left foot: Secondary | ICD-10-CM

## 2017-01-09 DIAGNOSIS — F5104 Psychophysiologic insomnia: Secondary | ICD-10-CM

## 2017-01-09 DIAGNOSIS — Z810 Family history of intellectual disabilities: Secondary | ICD-10-CM

## 2017-01-09 NOTE — Progress Notes (Signed)
[facility] MD/PA/NP OP Progress Note  01/09/2017 1:37 PM Shane [name]  MRN:  [id number]  Chief Complaint:  follow-up med management  Subjective:  Shane [name] presents for medication management follow-up. He reports that the Prozac increase is been tolerated well, and he feels like this is helped with his anxiety and depressive symptoms substantially.  He is off of Depakote which has helped with him feeling less sedated.   He reports that the Abilify has also helped with him not feeling so irritable and having as much verbal acting out. He reports that he does continue to feel sometimes quite frustrated, but he is better able to calm himself down before he reacts verbally. He denies any suicidal thoughts or unsafe thoughts. He reports that he and his wife continue to work on their relationship, but it has been improving.  He has specific concerns about sexual function, and difficulty with sustaining an erection. This is not necessarily due to drive, but due to physical difficulty in sustaining erection. This is also complicated by his wife going through menopause, and her having fairly low sex drive at the time. He is interested to try Viagra if this is a feasible option. Given that he is on multiple antihypertensives, I suggested to him that he defer to his primary care physician on this. Discussed that this would be a reasonable option if medically safe, given that SSRI plus Abilify would have significant diminishing of sex function as well.    He agrees to follow-up in 3 months and reports that he's can arrange for therapy follow-up visit in clinic as well. No other significant questions or concerns at this time.  Visit Diagnosis:    ICD-10-CM   1. Depression with anxiety F41.8   2. Psychophysiological insomnia F51.04     Past Psychiatric History: See intake H&P for full details. Reviewed, with no updates at this time.   Past Medical History:  Past Medical History:  Diagnosis Date  . Anemia   .  Bipolar disorder ([place])   . Cellulitis and abscess of left leg 06/2016  . Chest pain    a. 2015 Reportedly normal stress test in [place].  Marland Kitchen Chronic diastolic CHF (congestive heart failure) (HCC)    a.03/2015 Echo: EF 55-60%, Gr 1 DD, mild MR, triv PR.  . Depression   . Depression with anxiety   . Dyspnea    with exertion  . GERD (gastroesophageal reflux disease)   . Hyperlipidemia   . Hypertension    a. 08/2014 Admitted with hypertensive urgency.  . Insomnia   . Internal carotid artery stenosis   . Lower GI bleed 07/04/2015  . Neuropathy   . Refusal of blood transfusions as patient is Jehovah's Witness   . TIA (transient ischemic attack) 08/2014; 03/2015   a. 08/2014 in setting of hypertensive urgency.  . Type II diabetes mellitus ([place])    started insulin spring 2016, Type II  . Vitamin D deficiency spring 2016    Past Surgical History:  Procedure Laterality Date  . AMPUTATION Left 08/03/2015   Procedure: AMPUTATION LEFT GREAT TOE;  Surgeon: Newt Minion, MD;  Location: [facility];  Service: Orthopedics;  Laterality: Left;  . AMPUTATION Left 12/02/2015   Procedure: amputation of left 2nd digit  . AMPUTATION Left 04/10/2016   Procedure: Left 2nd Toe Amputation at MTP Joint;  Surgeon: Newt Minion, MD;  Location: [facility];  Service: Orthopedics;  Laterality: Left;  . AMPUTATION Left 06/09/2016   Procedure: AMPUTATION LEFT THIRD  TOE;  Surgeon: [name], MD;  Location: [facility];  Service: Orthopedics;  Laterality: Left;  . AMPUTATION Left 06/26/2016   Procedure: Left Foot Transmetatarsal Amputation;  Surgeon: Newt Minion, MD;  Location: [facility];  Service: Orthopedics;  Laterality: Left;  . APPLICATION OF WOUND VAC Left 12/19/2015   Procedure: APPLICATION OF WOUND VAC;  Surgeon: Meredith Pel, MD;  Location: [facility];  Service: Orthopedics;  Laterality: Left;  . CIRCUMCISION    . COLONOSCOPY N/A 01/22/2016   Procedure: COLONOSCOPY;  Surgeon: Gatha Mayer, MD;  Location: [facility];  Service:  Endoscopy;  Laterality: N/A;  . ESOPHAGOGASTRODUODENOSCOPY N/A 01/19/2016   Procedure: ESOPHAGOGASTRODUODENOSCOPY (EGD);  Surgeon: Manus Gunning, MD;  Location: [facility];  Service: Gastroenterology;  Laterality: N/A;  . ESOPHAGOGASTRODUODENOSCOPY N/A 01/22/2016   Procedure: ESOPHAGOGASTRODUODENOSCOPY (EGD);  Surgeon: Gatha Mayer, MD;  [facility] ENDOSCOPY;  Service: Endoscopy;  Laterality: N/A;  . I&D EXTREMITY Left 12/02/2015   Procedure: IRRIGATION AND DEBRIDEMENT OF FOOT; LEFT SECOND TOE AMPUTATION;  Surgeon: Meredith Pel, MD;  Location: [facility];  Service: Orthopedics;  Laterality: Left;  . I&D EXTREMITY Left 12/19/2015   Procedure: I & D LEFT FOOT WITH BEADS ;  Surgeon: Meredith Pel, MD;  Location: [facility];  Service: Orthopedics;  Laterality: Left;  . I&D EXTREMITY Right 01/17/2016   Procedure: IRRIGATION AND DEBRIDEMENT RIGHT FOOT;  Surgeon: Newt Minion, MD;  Location: [facility];  Service: Orthopedics;  Laterality: Right;  . INCISION AND DRAINAGE FOOT Right 01/17/2016  . INGUINAL HERNIA REPAIR Bilateral ~ 1983- ~ 1986  . TONSILLECTOMY  ~ 57    Family Psychiatric History: See intake H&P for full details. Reviewed, with no updates at this time.   Family History:  Family History  Problem Relation Age of Onset  . Hypertension Mother   . Diabetes Mother   . Hyperlipidemia Mother   . Heart disease Mother        s/p pacemaker  . Diabetes Father   . Hypertension Father   . Stroke Father   . Heart attack Father        first MI @ 48.  . Depression Father   . Dementia Father   . Stroke Brother   . ADD / ADHD Brother   . Anxiety disorder Brother   . Bipolar disorder Brother   . OCD Brother   . Sexual abuse Brother     Social History:  Social History   Social History  . Marital status: Married    Spouse name: N/A  . Number of children: N/A  . Years of education: N/A   Social History Main Topics  . Smoking status: Never Smoker  . Smokeless tobacco: Never  Used  . Alcohol use No  . Drug use: No  . Sexual activity: No   Other Topics Concern  . Not on file   Social History Narrative   Lives in [place] with wife.  Active but doesn't routinely exercise.    Allergies:  Allergies  Allergen Reactions  . Lactose Intolerance (Gi) Diarrhea and Other (See Comments)    Bloating (also)  . Other Other (See Comments)    Red meat causes stomach pains, bloating and diarrhea  . Milk-Related Compounds Diarrhea and Other (See Comments)    Any dairy products  - diarrhea and bloating    Metabolic Disorder Labs: Lab Results  Component Value Date   HGBA1C 8.3 11/12/2016   MPG 203 06/08/2016   MPG  177 03/09/2016   No results found for: PROLACTIN Lab Results  Component Value Date   CHOL 101 03/09/2016   TRIG 79 03/09/2016   HDL 31 (L) 03/09/2016   CHOLHDL 3.3 03/09/2016   VLDL 16 03/09/2016   LDLCALC 54 03/09/2016   LDLCALC 58 12/11/2015     Current Medications: Current Outpatient Prescriptions  Medication Sig Dispense Refill  . ACCU-CHEK SOFTCLIX LANCETS lancets Use as instructed 100 each 12  . amLODipine (NORVASC) 5 MG tablet TAKE 1 TABLET BY MOUTH DAILY. 30 tablet 2  . amoxicillin-clavulanate (AUGMENTIN) 875-125 MG tablet Take 1 tablet by mouth 2 (two) times daily. 20 tablet 0  . ARIPiprazole (ABILIFY) 2 MG tablet Take 2 tablets (4 mg total) by mouth daily. 90 tablet 1  . aspirin EC 81 MG tablet Take 1 tablet (81 mg total) by mouth daily. 30 tablet 11  . atorvastatin (LIPITOR) 80 MG tablet TAKE 1 TABLET BY MOUTH EVERY MORNING. 30 tablet 5  . Blood Glucose Monitoring Suppl (ACCU-CHEK AVIVA PLUS) w/Device KIT Use as directed 1 kit 0  . carvedilol (COREG) 12.5 MG tablet TAKE 1 TABLET BY MOUTH 2 TIMES DAILY WITH A MEAL 60 tablet 3  . Elastic Bandages & Supports (T.E.D. KNEE LENGTH/S-REGULAR) MISC Wear when awake 1 each 0  . FLUoxetine (PROZAC) 40 MG capsule Take 1 capsule (40 mg total) by mouth daily. 90 capsule 1  . fluticasone (FLONASE) 50  MCG/ACT nasal spray Place 2 sprays into both nostrils daily. 16 g 0  . folic acid (FOLVITE) 1 MG tablet Take 1 tablet (1 mg total) by mouth daily. 30 tablet 3  . furosemide (LASIX) 40 MG tablet TAKE 1 TABLET BY MOUTH EVERY OTHER DAY. 30 tablet 1  . gabapentin (NEURONTIN) 100 MG capsule TAKE 1 CAPSULE BY MOUTH 3 TIMES DAILY. 90 capsule 3  . glipiZIDE (GLUCOTROL XL) 10 MG 24 hr tablet Take 2 tablets (20 mg total) by mouth daily with breakfast. 60 tablet 5  . glucose blood (ACCU-CHEK AVIVA PLUS) test strip Use as instructed 100 each 12  . HYDROcodone-acetaminophen (NORCO/VICODIN) 5-325 MG tablet Take 1 tablet by mouth every 4 (four) hours as needed for moderate pain. 10 tablet 0  . ibuprofen (ADVIL,MOTRIN) 800 MG tablet TAKE 1 TABLET BY MOUTH EVERY 8 HOURS AS NEEDED. 30 tablet 0  . Insulin Detemir (LEVEMIR FLEXPEN) 100 UNIT/ML Pen Inject 15 Units into the skin every morning. 6 mL 3  . Insulin Pen Needle (B-D ULTRAFINE III SHORT PEN) 31G X 8 MM MISC 1 application by Does not apply route 3 (three) times daily. 90 each 11  . Lancet Devices (ACCU-CHEK SOFTCLIX) lancets Use as instructed 1 each 0  . lisinopril (PRINIVIL,ZESTRIL) 40 MG tablet TAKE 1 TABLET BY MOUTH DAILY. 30 tablet 2  . loratadine (CLARITIN) 10 MG tablet Take 1 tablet (10 mg total) by mouth daily. (Patient not taking: Reported on 11/12/2016) 30 tablet 0  . metFORMIN (GLUCOPHAGE) 1000 MG tablet TAKE 1 TABLET BY MOUTH 2 TIMES DAILY WITH A MEAL. 60 tablet 2  . ondansetron (ZOFRAN ODT) 8 MG disintegrating tablet Take 1 tablet (8 mg total) by mouth every 8 (eight) hours as needed for nausea or vomiting. 30 tablet 0  . pantoprazole (PROTONIX) 40 MG tablet TAKE 1 TABLET BY MOUTH DAILY. 30 tablet 2  . polyethylene glycol (MIRALAX / GLYCOLAX) packet Take 17 g by mouth daily as needed for moderate constipation. 14 each 0  . ranitidine (ZANTAC) 300 MG tablet Take 1 tablet (  300 mg total) by mouth at bedtime. 30 tablet 5  . senna-docusate (SENOKOT-S)  8.6-50 MG tablet Take 1 tablet by mouth at bedtime. (Patient taking differently: Take 1 tablet by mouth at bedtime as needed for mild constipation. ) 60 tablet 2  . traZODone (DESYREL) 100 MG tablet TAKE 2 TABLETS (200 MG TOTAL) BY MOUTH AT BEDTIME. 90 tablet 1  . vitamin B-12 1000 MCG tablet Take 1 tablet (1,000 mcg total) by mouth daily. (Patient not taking: Reported on 11/12/2016) 30 tablet 3  . Vitamin D, Ergocalciferol, (DRISDOL) 50000 units CAPS capsule Take 1 capsule (50,000 Units total) by mouth every 30 (thirty) days. Every 30 days (Patient taking differently: Take 50,000 Units by mouth every 30 (thirty) days. ) 12 capsule 1   No current facility-administered medications for this visit.     Neurologic: Headache: Negative Seizure: Negative Paresthesias: Negative  Musculoskeletal: Strength & Muscle Tone: within normal limits Gait & Station: normal, with cane Patient leans: N/A  Psychiatric Specialty Exam: ROS  Blood pressure 126/78, pulse 94, height 5' 8" (1.727 m), weight 195 lb 9.6 oz (88.7 kg).Body mass index is 29.74 kg/m.  General Appearance: Casual and Fairly Groomed  Eye Contact:  Good  Speech:  Clear and Coherent  Volume:  Normal  Mood:  Euthymic  Affect:  Appropriate and Congruent  Thought Process:  Goal Directed  Orientation:  Full (Time, Place, and Person)  Thought Content: Logical   Suicidal Thoughts:  No  Homicidal Thoughts:  No  Memory:  Immediate;   Good  Judgement:  Good  Insight:  Good  Psychomotor Activity:  Normal  Concentration:  Attention Span: Good  Recall:  Good  Fund of Knowledge: Good  Language: Good  Akathisia:  Negative  Handed:  Right  AIMS (if indicated):  0  Assets:  Communication Skills Desire for Improvement Financial Resources/Insurance Housing Leisure Time Resilience Social Support Transportation  ADL's:  Intact  Cognition: WNL  Sleep:  7-8 hours     Treatment Plan Summary: Shane [name] is a 46 year old male with a  history of major depressive disorder, and multiple medical problems who presents today for psychiatric med management follow-up. He has had a good response to interval increase in Prozac to 40 mg, augmenting with Abilify 2 mg daily, and trazodone 100-200 mg nightly for sleep. He does not have any acute safety issues and has good support from his wife.  His primary concern today is related to erectile dysfunction, and he is specifically wondering about whether or not he would be an appropriate candidate for Viagra. He describes erectile dysfunction secondary to physical impairment, not mental or emotional impairment.  I will defer to his primary care physician given his multiple medical issues, and the potential interaction of Viagra with his antihypertensives.  1. Depression with anxiety   2. Psychophysiological insomnia     Continue Abilify 2 mg daily Continue Prozac 40 mg daily Continue trazodone 100 200 mg nightly Return to clinic in 3 months  Aundra Dubin, MD 01/09/2017, 1:37 PM

## 2017-01-13 ENCOUNTER — Ambulatory Visit (HOSPITAL_COMMUNITY): Payer: Self-pay | Admitting: Licensed Clinical Social Worker

## 2017-01-14 ENCOUNTER — Ambulatory Visit (INDEPENDENT_AMBULATORY_CARE_PROVIDER_SITE_OTHER): Payer: Self-pay | Admitting: Orthopedic Surgery

## 2017-01-14 ENCOUNTER — Encounter (INDEPENDENT_AMBULATORY_CARE_PROVIDER_SITE_OTHER): Payer: Self-pay | Admitting: Orthopedic Surgery

## 2017-01-14 VITALS — Ht 68.0 in | Wt 195.0 lb

## 2017-01-14 DIAGNOSIS — M6701 Short Achilles tendon (acquired), right ankle: Secondary | ICD-10-CM

## 2017-01-14 DIAGNOSIS — Z89432 Acquired absence of left foot: Secondary | ICD-10-CM

## 2017-01-14 DIAGNOSIS — M6702 Short Achilles tendon (acquired), left ankle: Secondary | ICD-10-CM

## 2017-01-14 NOTE — Progress Notes (Signed)
Office Visit Note   Patient: Shane [name]           Date of Birth: 09-03-1970           MRN: [id number] Visit Date: 01/14/2017              Requested by: Boykin Nearing, MD [address] [place], [place] [address] PCP: Boykin Nearing, MD  Chief Complaint  Patient presents with  . Right Foot - Follow-up    Achilles contracture.   . Left Foot - Follow-up    transmet amputation       HPI: Patient is a 46 year old gentleman presents in follow-up for bilateral Achilles tendon contractures with transmetatarsal amputation left foot. Patient is in regular shoewear he states he does have custom orthotics but is not wearing them today. He is ambulating with a cane he states that he is doing much better.  Assessment & Plan: Visit Diagnoses:  1. S/P transmetatarsal amputation of foot, left ([place])   2. Achilles tendon contracture, left   3. Achilles tendon contracture, right     Plan: Recommended continued heel cord stretching recommend using the orthotics is much as possible. Will follow-up as needed if he develops any blisters or ulcers.  Follow-Up Instructions: Return if symptoms worsen or fail to improve.   Ortho Exam  Patient is alert, oriented, no adenopathy, well-dressed, normal affect, normal respiratory effort. Examination patient is a slight antalgic gait using a cane for ambulation. With pressure he has dorsiflexion of both feet about 10 past neutral he has good pulses bilaterally there is no plantar ulcers or calluses. No redness no cellulitis in either lower extremity.  Imaging: No results found.  Labs: Lab Results  Component Value Date   HGBA1C 8.3 11/12/2016   HGBA1C 7.6 08/23/2016   HGBA1C 8.7 (H) 06/08/2016   ESRSEDRATE 22 (H) 06/24/2016   ESRSEDRATE 27 (H) 06/08/2016   ESRSEDRATE 48 (H) 02/20/2016   CRP <0.8 06/24/2016   CRP 0.5 02/20/2016   CRP 0.8 12/31/2015   REPTSTATUS 06/28/2016 FINAL 06/27/2016   GRAMSTAIN  02/21/2016    RARE WBC PRESENT,  PREDOMINANTLY MONONUCLEAR RARE GRAM POSITIVE COCCI IN PAIRS    CULT NO GROWTH 06/27/2016   LABORGA STAPHYLOCOCCUS AUREUS 02/21/2016    Orders:  No orders of the defined types were placed in this encounter.  No orders of the defined types were placed in this encounter.    Procedures: No procedures performed  Clinical Data: No additional findings.  ROS:  All other systems negative, except as noted in the HPI. Review of Systems  Objective: Vital Signs: Ht 5\' 8"  (1.727 m)   Wt 195 lb (88.5 kg)   BMI 29.65 kg/m   Specialty Comments:  No specialty comments available.  PMFS History: Patient Active Problem List   Diagnosis Date Noted  . Achilles tendon contracture, left 01/14/2017  . Gingivitis 11/14/2016  . Lower abdominal pain 11/14/2016  . Achilles tendon contracture, right 09/17/2016  . Pain and swelling of left lower leg 09/16/2016  . CKD (chronic kidney disease) stage 3, GFR 30-59 ml/min 08/27/2016  . Dyslipidemia associated with type 2 diabetes mellitus ([place]) 07/22/2016  . Constipation 07/02/2016  . S/P transmetatarsal amputation of foot, left ([place]) 07/02/2016  . Dyspnea on exertion 07/02/2016  . Subacute osteomyelitis, left ankle and foot ([place]) 06/08/2016  . Diabetic gastroparesis ([place]) 05/30/2016  . Dermatitis 04/26/2016  . Buzzing in ear, right 03/26/2016  . Joint pain 03/11/2016  . Weakness 03/08/2016  . Dizziness  03/03/2016  . Chronic diastolic CHF (congestive heart failure), NYHA class 1 ([place]) 12/30/2015  . Diabetes mellitus type 2, uncontrolled, with complications ([place]) 28/78/6767  . Depression with anxiety 12/01/2015  . Pain in the chest 12/01/2015  . Insomnia 11/21/2015  . GERD (gastroesophageal reflux disease) 08/18/2015  . Erectile dysfunction 07/04/2015  . Symptomatic anemia 04/24/2015  . AKI (acute kidney injury) ([place]) 04/24/2015  . Left arm weakness 04/02/2015  . Sensory disturbance 04/02/2015  . Essential hypertension 04/02/2015  .  Hemispheric carotid artery syndrome   . HLD (hyperlipidemia)   . Headache    Past Medical History:  Diagnosis Date  . Anemia   . Bipolar disorder ([place])   . Cellulitis and abscess of left leg 06/2016  . Chest pain    a. 2015 Reportedly normal stress test in [place].  Marland Kitchen Chronic diastolic CHF (congestive heart failure) ([facility])    a.03/2015 Echo: EF 55-60%, Gr 1 DD, mild MR, triv PR.  . Depression   . Depression with anxiety   . Dyspnea    with exertion  . GERD (gastroesophageal reflux disease)   . Hyperlipidemia   . Hypertension    a. 08/2014 Admitted with hypertensive urgency.  . Insomnia   . Internal carotid artery stenosis   . Lower GI bleed 07/04/2015  . Neuropathy   . Refusal of blood transfusions as patient is Jehovah's Witness   . TIA (transient ischemic attack) 08/2014; 03/2015   a. 08/2014 in setting of hypertensive urgency.  . Type II diabetes mellitus ([place])    started insulin spring 2016, Type II  . Vitamin D deficiency spring 2016    Family History  Problem Relation Age of Onset  . Hypertension Mother   . Diabetes Mother   . Hyperlipidemia Mother   . Heart disease Mother        s/p pacemaker  . Diabetes Father   . Hypertension Father   . Stroke Father   . Heart attack Father        first MI @ 37.  . Depression Father   . Dementia Father   . Stroke Brother   . ADD / ADHD Brother   . Anxiety disorder Brother   . Bipolar disorder Brother   . OCD Brother   . Sexual abuse Brother     Past Surgical History:  Procedure Laterality Date  . AMPUTATION Left 08/03/2015   Procedure: AMPUTATION LEFT GREAT TOE;  Surgeon: Newt Minion, MD;  Location: [facility];  Service: Orthopedics;  Laterality: Left;  . AMPUTATION Left 12/02/2015   Procedure: amputation of left 2nd digit  . AMPUTATION Left 04/10/2016   Procedure: Left 2nd Toe Amputation at MTP Joint;  Surgeon: Newt Minion, MD;  Location: [facility];  Service: Orthopedics;  Laterality: Left;  . AMPUTATION Left 06/09/2016    Procedure: AMPUTATION LEFT THIRD TOE;  Surgeon: [name], MD;  Location: [facility];  Service: Orthopedics;  Laterality: Left;  . AMPUTATION Left 06/26/2016   Procedure: Left Foot Transmetatarsal Amputation;  Surgeon: Newt Minion, MD;  Location: [facility];  Service: Orthopedics;  Laterality: Left;  . APPLICATION OF WOUND VAC Left 12/19/2015   Procedure: APPLICATION OF WOUND VAC;  Surgeon: Meredith Pel, MD;  Location: [facility];  Service: Orthopedics;  Laterality: Left;  . CIRCUMCISION    . COLONOSCOPY N/A 01/22/2016   Procedure: COLONOSCOPY;  Surgeon: Gatha Mayer, MD;  Location: [facility];  Service: Endoscopy;  Laterality: N/A;  . ESOPHAGOGASTRODUODENOSCOPY N/A 01/19/2016  Procedure: ESOPHAGOGASTRODUODENOSCOPY (EGD);  Surgeon: Manus Gunning, MD;  Location: [facility];  Service: Gastroenterology;  Laterality: N/A;  . ESOPHAGOGASTRODUODENOSCOPY N/A 01/22/2016   Procedure: ESOPHAGOGASTRODUODENOSCOPY (EGD);  Surgeon: Gatha Mayer, MD;  Location: [facility] ENDOSCOPY;  Service: Endoscopy;  Laterality: N/A;  . I&D EXTREMITY Left 12/02/2015   Procedure: IRRIGATION AND DEBRIDEMENT OF FOOT; LEFT SECOND TOE AMPUTATION;  Surgeon: Meredith Pel, MD;  Location: [facility];  Service: Orthopedics;  Laterality: Left;  . I&D EXTREMITY Left 12/19/2015   Procedure: I & D LEFT FOOT WITH BEADS ;  Surgeon: Meredith Pel, MD;  Location: [facility];  Service: Orthopedics;  Laterality: Left;  . I&D EXTREMITY Right 01/17/2016   Procedure: IRRIGATION AND DEBRIDEMENT RIGHT FOOT;  Surgeon: Newt Minion, MD;  Location: [facility];  Service: Orthopedics;  Laterality: Right;  . INCISION AND DRAINAGE FOOT Right 01/17/2016  . INGUINAL HERNIA REPAIR Bilateral ~ 1983- ~ 1986  . TONSILLECTOMY  ~ 36   Social History   Occupational History  . Not on file.   Social History Main Topics  . Smoking status: Never Smoker  . Smokeless tobacco: Never Used  . Alcohol use No  . Drug use: No  . Sexual activity: No

## 2017-01-20 ENCOUNTER — Encounter: Payer: Self-pay | Admitting: Family Medicine

## 2017-01-20 ENCOUNTER — Ambulatory Visit: Payer: Self-pay | Attending: Family Medicine | Admitting: Family Medicine

## 2017-01-20 ENCOUNTER — Other Ambulatory Visit: Payer: Self-pay

## 2017-01-20 VITALS — BP 140/78 | HR 102 | Temp 98.5°F | Ht 68.0 in | Wt 196.0 lb

## 2017-01-20 DIAGNOSIS — Z7982 Long term (current) use of aspirin: Secondary | ICD-10-CM | POA: Insufficient documentation

## 2017-01-20 DIAGNOSIS — IMO0002 Reserved for concepts with insufficient information to code with codable children: Secondary | ICD-10-CM

## 2017-01-20 DIAGNOSIS — E739 Lactose intolerance, unspecified: Secondary | ICD-10-CM | POA: Insufficient documentation

## 2017-01-20 DIAGNOSIS — F329 Major depressive disorder, single episode, unspecified: Secondary | ICD-10-CM | POA: Insufficient documentation

## 2017-01-20 DIAGNOSIS — R002 Palpitations: Secondary | ICD-10-CM

## 2017-01-20 DIAGNOSIS — N529 Male erectile dysfunction, unspecified: Secondary | ICD-10-CM | POA: Insufficient documentation

## 2017-01-20 DIAGNOSIS — E1143 Type 2 diabetes mellitus with diabetic autonomic (poly)neuropathy: Secondary | ICD-10-CM | POA: Insufficient documentation

## 2017-01-20 DIAGNOSIS — I10 Essential (primary) hypertension: Secondary | ICD-10-CM

## 2017-01-20 DIAGNOSIS — D649 Anemia, unspecified: Secondary | ICD-10-CM | POA: Insufficient documentation

## 2017-01-20 DIAGNOSIS — R0609 Other forms of dyspnea: Secondary | ICD-10-CM

## 2017-01-20 DIAGNOSIS — E1122 Type 2 diabetes mellitus with diabetic chronic kidney disease: Secondary | ICD-10-CM | POA: Insufficient documentation

## 2017-01-20 DIAGNOSIS — R197 Diarrhea, unspecified: Secondary | ICD-10-CM | POA: Insufficient documentation

## 2017-01-20 DIAGNOSIS — K3184 Gastroparesis: Secondary | ICD-10-CM | POA: Insufficient documentation

## 2017-01-20 DIAGNOSIS — N183 Chronic kidney disease, stage 3 (moderate): Secondary | ICD-10-CM | POA: Insufficient documentation

## 2017-01-20 DIAGNOSIS — E1165 Type 2 diabetes mellitus with hyperglycemia: Secondary | ICD-10-CM

## 2017-01-20 DIAGNOSIS — I129 Hypertensive chronic kidney disease with stage 1 through stage 4 chronic kidney disease, or unspecified chronic kidney disease: Secondary | ICD-10-CM | POA: Insufficient documentation

## 2017-01-20 DIAGNOSIS — Z79899 Other long term (current) drug therapy: Secondary | ICD-10-CM | POA: Insufficient documentation

## 2017-01-20 DIAGNOSIS — E118 Type 2 diabetes mellitus with unspecified complications: Secondary | ICD-10-CM

## 2017-01-20 DIAGNOSIS — F419 Anxiety disorder, unspecified: Secondary | ICD-10-CM | POA: Insufficient documentation

## 2017-01-20 DIAGNOSIS — M7989 Other specified soft tissue disorders: Secondary | ICD-10-CM | POA: Insufficient documentation

## 2017-01-20 DIAGNOSIS — Z794 Long term (current) use of insulin: Secondary | ICD-10-CM

## 2017-01-20 LAB — POCT GLYCOSYLATED HEMOGLOBIN (HGB A1C): Hemoglobin A1C: 9.4

## 2017-01-20 LAB — GLUCOSE, POCT (MANUAL RESULT ENTRY): POC GLUCOSE: 210 mg/dL — AB (ref 70–99)

## 2017-01-20 MED ORDER — INSULIN DETEMIR 100 UNIT/ML FLEXPEN: 15.0000 [IU] | PEN_INJECTOR | Freq: Every day | SUBCUTANEOUS | 3 refills | Status: DC

## 2017-01-20 MED ORDER — AMLODIPINE BESYLATE 10 MG PO TABS: 10.0000 mg | ORAL_TABLET | Freq: Every day | ORAL | 11 refills | Status: DC

## 2017-01-20 MED FILL — AMLODIPINE BESYLATE 10 MG T: 10 | 30 days supply | Qty: 30 | Fill #0

## 2017-01-20 NOTE — Progress Notes (Signed)
Subjective:  Patient ID: Shane [name], male    DOB: May 19, 1971  Age: 46 y.o. MRN: [id number]  CC: Diabetes and Leg Swelling   HPI Yvette Roark has diabetes with complications (CKD stage 2-3, gastroparesis)  s/p L transmetatarsal amputation (followed by [facility], Dr. Sharol Given), anemia, HTN, depression and anxiety (treated at [facility]), chronic diastolic dysfunction he presents for  presents for   1. CHRONIC DIABETES Disease Monitoring  Blood Sugar Ranges:   Fasting: 105-123  Postprandial: 140-150s  Polyuria: yes   Visual problems: yes   Medication Compliance: not taking levemir increase to 15 U daily (for 3 weeks), taking  metformin 1000 mg BID and glipizide XL 20 mg daily. .  Medication Side Effects  Hypoglycemia: no  Preventitive Health Care   Eye Exam: due    2. Erectile dysfunction: he reports getting a erection is easy, but he is not keeping it. This has been going on for about one month. His mood is doing well. He does take Prozac for depression.   3. Lactose intolerance: for the last 2 months he notices diarrhea and bloating after eating dairy. He denies emesis and blood in stool.    4. CHRONIC HYPERTENSION: he is compliant with regimen. He denies HA and chest pain. He has dyspnea on exetion.  Has some leg swelling.    Social History  Substance Use Topics  . Smoking status: Never Smoker  . Smokeless tobacco: Never Used  . Alcohol use No    Outpatient Medications Prior to Visit  Medication Sig Dispense Refill  . ACCU-CHEK SOFTCLIX LANCETS lancets Use as instructed 100 each 12  . amLODipine (NORVASC) 5 MG tablet TAKE 1 TABLET BY MOUTH DAILY. 30 tablet 2  . amoxicillin-clavulanate (AUGMENTIN) 875-125 MG tablet Take 1 tablet by mouth 2 (two) times daily. 20 tablet 0  . ARIPiprazole (ABILIFY) 2 MG tablet Take 2 tablets (4 mg total) by mouth daily. 90 tablet 1  . aspirin EC 81 MG tablet Take 1 tablet (81 mg total) by mouth daily. 30 tablet 11  .  atorvastatin (LIPITOR) 80 MG tablet TAKE 1 TABLET BY MOUTH EVERY MORNING. 30 tablet 5  . Blood Glucose Monitoring Suppl (ACCU-CHEK AVIVA PLUS) w/Device KIT Use as directed 1 kit 0  . carvedilol (COREG) 12.5 MG tablet TAKE 1 TABLET BY MOUTH 2 TIMES DAILY WITH A MEAL 60 tablet 3  . Elastic Bandages & Supports (T.E.D. KNEE LENGTH/S-REGULAR) MISC Wear when awake 1 each 0  . FLUoxetine (PROZAC) 40 MG capsule Take 1 capsule (40 mg total) by mouth daily. 90 capsule 1  . fluticasone (FLONASE) 50 MCG/ACT nasal spray Place 2 sprays into both nostrils daily. 16 g 0  . folic acid (FOLVITE) 1 MG tablet Take 1 tablet (1 mg total) by mouth daily. 30 tablet 3  . furosemide (LASIX) 40 MG tablet TAKE 1 TABLET BY MOUTH EVERY OTHER DAY. 30 tablet 1  . gabapentin (NEURONTIN) 100 MG capsule TAKE 1 CAPSULE BY MOUTH 3 TIMES DAILY. 90 capsule 3  . glipiZIDE (GLUCOTROL XL) 10 MG 24 hr tablet Take 2 tablets (20 mg total) by mouth daily with breakfast. 60 tablet 5  . glucose blood (ACCU-CHEK AVIVA PLUS) test strip Use as instructed 100 each 12  . HYDROcodone-acetaminophen (NORCO/VICODIN) 5-325 MG tablet Take 1 tablet by mouth every 4 (four) hours as needed for moderate pain. 10 tablet 0  . ibuprofen (ADVIL,MOTRIN) 800 MG tablet TAKE 1 TABLET BY MOUTH EVERY 8 HOURS AS NEEDED. 30 tablet  0  . Insulin Detemir (LEVEMIR FLEXPEN) 100 UNIT/ML Pen Inject 15 Units into the skin every morning. 6 mL 3  . Insulin Pen Needle (B-D ULTRAFINE III SHORT PEN) 31G X 8 MM MISC 1 application by Does not apply route 3 (three) times daily. 90 each 11  . Lancet Devices (ACCU-CHEK SOFTCLIX) lancets Use as instructed 1 each 0  . lisinopril (PRINIVIL,ZESTRIL) 40 MG tablet TAKE 1 TABLET BY MOUTH DAILY. 30 tablet 2  . loratadine (CLARITIN) 10 MG tablet Take 1 tablet (10 mg total) by mouth daily. 30 tablet 0  . metFORMIN (GLUCOPHAGE) 1000 MG tablet TAKE 1 TABLET BY MOUTH 2 TIMES DAILY WITH A MEAL. 60 tablet 2  . ondansetron (ZOFRAN ODT) 8 MG  disintegrating tablet Take 1 tablet (8 mg total) by mouth every 8 (eight) hours as needed for nausea or vomiting. 30 tablet 0  . pantoprazole (PROTONIX) 40 MG tablet TAKE 1 TABLET BY MOUTH DAILY. 30 tablet 2  . polyethylene glycol (MIRALAX / GLYCOLAX) packet Take 17 g by mouth daily as needed for moderate constipation. 14 each 0  . ranitidine (ZANTAC) 300 MG tablet Take 1 tablet (300 mg total) by mouth at bedtime. 30 tablet 5  . senna-docusate (SENOKOT-S) 8.6-50 MG tablet Take 1 tablet by mouth at bedtime. (Patient taking differently: Take 1 tablet by mouth at bedtime as needed for mild constipation. ) 60 tablet 2  . traZODone (DESYREL) 100 MG tablet TAKE 2 TABLETS (200 MG TOTAL) BY MOUTH AT BEDTIME. 90 tablet 1  . vitamin B-12 1000 MCG tablet Take 1 tablet (1,000 mcg total) by mouth daily. 30 tablet 3  . Vitamin D, Ergocalciferol, (DRISDOL) 50000 units CAPS capsule Take 1 capsule (50,000 Units total) by mouth every 30 (thirty) days. Every 30 days (Patient taking differently: Take 50,000 Units by mouth every 30 (thirty) days. ) 12 capsule 1   No facility-administered medications prior to visit.     ROS Review of Systems  Constitutional: Negative for appetite change, chills, fatigue, fever and unexpected weight change.  Eyes: Negative for visual disturbance.  Respiratory: Positive for shortness of breath. Negative for cough.   Cardiovascular: Positive for leg swelling. Negative for chest pain and palpitations.  Gastrointestinal: Negative for abdominal pain, blood in stool, diarrhea, nausea and vomiting.  Endocrine: Negative for polydipsia, polyphagia and polyuria.  Musculoskeletal: Negative for arthralgias, back pain, gait problem, myalgias and neck pain.  Skin: Negative for rash.  Allergic/Immunologic: Negative for immunocompromised state.  Neurological: Negative for light-headedness.  Hematological: Negative for adenopathy. Does not bruise/bleed easily.  Psychiatric/Behavioral: Negative for  dysphoric mood, sleep disturbance and suicidal ideas. The patient is not nervous/anxious.     Objective:  BP 140/78   Pulse (!) 102   Temp 98.5 F (36.9 C) (Oral)   Ht 5' 8"  (1.727 m)   Wt 196 lb (88.9 kg)   SpO2 100%   BMI 29.80 kg/m   BP/Weight 01/20/2017 01/14/2017 9/67/5916  Systolic BP 384 - 665  Diastolic BP 78 - 78  Wt. (Lbs) 196 195 195.6  BMI 29.8 29.65 29.74   BP Readings from Last 3 Encounters:  01/20/17 140/78  01/09/17 126/78  11/12/16 119/77     Physical Exam  Constitutional: He appears well-developed and well-nourished. No distress.  HENT:  Head: Normocephalic and atraumatic.  Neck: Normal range of motion. Neck supple.  Cardiovascular: Normal rate, regular rhythm, normal heart sounds and intact distal pulses.   Pulmonary/Chest: Effort normal and breath sounds normal.  Musculoskeletal:  Feet:  Neurological: He is alert.  Skin: Skin is warm and dry. No rash noted. No erythema.  Psychiatric: He has a normal mood and affect.   Lab Results  Component Value Date   HGBA1C 8.3 11/12/2016   Lab Results  Component Value Date   HGBA1C 9.4 01/20/2017    CBG 210   Lab Results  Component Value Date   WBC 9.9 01/20/2017   HGB 10.6 (L) 01/20/2017   HCT 32.4 (L) 01/20/2017   MCV 93 01/20/2017   PLT 301 01/20/2017     Chemistry      Component Value Date/Time   NA 139 09/20/2016 0002   K 4.4 09/20/2016 0002   CL 103 09/20/2016 0002   CO2 27 09/20/2016 0002   BUN 23 (H) 09/20/2016 0002   CREATININE 1.24 09/20/2016 0002   CREATININE 1.79 (H) 08/02/2016 1409      Component Value Date/Time   CALCIUM 8.9 09/20/2016 0002   ALKPHOS 47 09/20/2016 0002   AST 20 09/20/2016 0002   ALT 15 (L) 09/20/2016 0002   BILITOT 0.5 09/20/2016 0002      EKG: normal EKG, normal sinus rhythm.  Assessment & Plan:  Lovis was seen today for diabetes and leg swelling.  Diagnoses and all orders for this visit:  Uncontrolled type 2 diabetes mellitus with  complication, with long-term current use of insulin ([facility]) -     Glucose (CBG) -     HgB A1c -     Discontinue: Insulin Detemir (LEVEMIR FLEXPEN) 100 UNIT/ML Pen; Inject 15 Units into the skin daily at 10 pm. -     Insulin Detemir (LEVEMIR FLEXPEN) 100 UNIT/ML Pen; Inject 15 Units into the skin daily at 10 pm.  Heart palpitations -     CBC -     EKG 12-Lead -     Check Pulse Oximetry while ambulating  Dyspnea on exertion -     CBC -     EKG 12-Lead -     Check Pulse Oximetry while ambulating -     Ferritin -     Iron and TIBC  Essential hypertension -     amLODipine (NORVASC) 10 MG tablet; Take 1 tablet (10 mg total) by mouth daily.   No orders of the defined types were placed in this encounter.   Follow-up: Return in about 6 weeks (around 03/03/2017) for HTN and diabetes.   Boykin Nearing MD

## 2017-01-20 NOTE — Patient Instructions (Addendum)
Sabino was seen today for diabetes and leg swelling.  Diagnoses and all orders for this visit:  Uncontrolled type 2 diabetes mellitus with complication, with long-term current use of insulin ([facility]) -     Glucose (CBG) -     HgB A1c -     Discontinue: Insulin Detemir (LEVEMIR FLEXPEN) 100 UNIT/ML Pen; Inject 15 Units into the skin daily at 10 pm. -     Insulin Detemir (LEVEMIR FLEXPEN) 100 UNIT/ML Pen; Inject 15 Units into the skin daily at 10 pm.  Heart palpitations -     CBC -     EKG 12-Lead -     Check Pulse Oximetry while ambulating  Dyspnea on exertion -     CBC -     EKG 12-Lead -     Check Pulse Oximetry while ambulating -     Ferritin -     Iron and TIBC  Essential hypertension -     amLODipine (NORVASC) 10 MG tablet; Take 1 tablet (10 mg total) by mouth daily.   Increase norvasc to 10 mg daily Restart levemir 15 U nightly  Diabetes blood sugar goals  Fasting (in AM before breakfast, 8 hrs of no eating or drinking (except water or unsweetened coffee or tea): 90-110 2 hrs after meals: < 160,   No low sugars: nothing < 70   F/u in 6 weeks for flu shot and HTN, you will be called results   Dr. Adrian Blackwater

## 2017-01-21 LAB — CBC
Hematocrit: 32.4 % — ABNORMAL LOW (ref 37.5–51.0)
Hemoglobin: 10.6 g/dL — ABNORMAL LOW (ref 13.0–17.7)
MCH: 30.5 pg (ref 26.6–33.0)
MCHC: 32.7 g/dL (ref 31.5–35.7)
MCV: 93 fL (ref 79–97)
Platelets: 301 10*3/uL (ref 150–379)
RBC: 3.48 x10E6/uL — ABNORMAL LOW (ref 4.14–5.80)
RDW: 13.1 % (ref 12.3–15.4)
WBC: 9.9 10*3/uL (ref 3.4–10.8)

## 2017-01-21 LAB — IRON AND TIBC
IRON SATURATION: 23 % (ref 15–55)
Iron: 60 ug/dL (ref 38–169)
TIBC: 266 ug/dL (ref 250–450)
UIBC: 206 ug/dL (ref 111–343)

## 2017-01-21 LAB — FERRITIN: Ferritin: 410 ng/mL — ABNORMAL HIGH (ref 30–400)

## 2017-01-21 NOTE — Assessment & Plan Note (Signed)
A: hypertensive Med: compliant P: Increase Norvasc to 10 mg daily Continue  Carvedilol 12.5 mg BID Lisinopril 40 mg daily

## 2017-01-21 NOTE — Assessment & Plan Note (Signed)
A: diabetes with hyperglycemia on Abilify Med: non compliant. He has not taken levemir for 3 weeks P: Restart levemir 15 U dail Continue metformin 1000 mg BID Continue glipizide XL 20 mg daily

## 2017-01-21 NOTE — Assessment & Plan Note (Signed)
Stable Hgb with mild anemia

## 2017-01-22 MED FILL — FUROSEMIDE 40 MG TABLET: 40 | 30 days supply | Qty: 15 | Fill #3

## 2017-01-27 ENCOUNTER — Telehealth: Payer: Self-pay

## 2017-01-27 ENCOUNTER — Encounter (HOSPITAL_COMMUNITY): Payer: Self-pay | Admitting: Licensed Clinical Social Worker

## 2017-01-27 ENCOUNTER — Ambulatory Visit (INDEPENDENT_AMBULATORY_CARE_PROVIDER_SITE_OTHER): Payer: No Typology Code available for payment source | Admitting: Licensed Clinical Social Worker

## 2017-01-27 DIAGNOSIS — F418 Other specified anxiety disorders: Secondary | ICD-10-CM

## 2017-01-27 MED FILL — GABAPENTIN 100 MG CAP: 100 | 30 days supply | Qty: 90 | Fill #1

## 2017-01-27 NOTE — Telephone Encounter (Signed)
Pt was called and informed of lab results. 

## 2017-01-27 NOTE — Progress Notes (Signed)
   THERAPIST PROGRESS NOTE  Session Time: 1:10-2pm  Participation Level: Active  Behavioral Response: CasualAlertDepressed  Type of Therapy: Individual Therapy  Treatment Goals addressed: Coping  Interventions: CBT  Summary: Shane [name] is a 46 y.o. male who presents for his first individual counseling session. Much of today was spent building a trusting therapeutic relationship. Pt reports his depression is usually on a scale of 0-10, with 10 being the worse depression, a 6 or 8 unless he is at church. He is a Designer, television/film set. Pt presented sad. Pt reports he is lonely as he has no friends, he is inactive due to his health issues, has intimacy issues with his wife, on disability. Talked with pt at length about IOP. He says his wife has concerns about group process. Will continue to see pt individually unless he changes his mind about IOP program.   Suicidal/Homicidal: Nowithout intent/plan  Therapist Response: Assessed pt's current functioning for baseline. Assisted pt in building a trusting therapeutic relationship.  Plan: Return again in 2 weeks. Again discuss IOP program. Complete tx plan.  Diagnosis: Axis 1: Depression with Anxiety    Blue Winther S, LCAS 01/27/2017

## 2017-01-29 ENCOUNTER — Emergency Department (HOSPITAL_COMMUNITY): Payer: Self-pay

## 2017-01-29 ENCOUNTER — Encounter (HOSPITAL_COMMUNITY): Payer: Self-pay | Admitting: *Deleted

## 2017-01-29 ENCOUNTER — Emergency Department (HOSPITAL_COMMUNITY)
Admission: EM | Admit: 2017-01-29 | Discharge: 2017-01-29 | Disposition: A | Discharge status: Home / Self Care | Payer: Self-pay | Attending: Emergency Medicine | Admitting: Emergency Medicine

## 2017-01-29 DIAGNOSIS — E785 Hyperlipidemia, unspecified: Secondary | ICD-10-CM | POA: Insufficient documentation

## 2017-01-29 DIAGNOSIS — Z79899 Other long term (current) drug therapy: Secondary | ICD-10-CM | POA: Insufficient documentation

## 2017-01-29 DIAGNOSIS — Z8673 Personal history of transient ischemic attack (TIA), and cerebral infarction without residual deficits: Secondary | ICD-10-CM | POA: Insufficient documentation

## 2017-01-29 DIAGNOSIS — I5032 Chronic diastolic (congestive) heart failure: Secondary | ICD-10-CM | POA: Insufficient documentation

## 2017-01-29 DIAGNOSIS — I110 Hypertensive heart disease with heart failure: Secondary | ICD-10-CM | POA: Insufficient documentation

## 2017-01-29 DIAGNOSIS — R1013 Epigastric pain: Secondary | ICD-10-CM | POA: Insufficient documentation

## 2017-01-29 DIAGNOSIS — R197 Diarrhea, unspecified: Secondary | ICD-10-CM | POA: Insufficient documentation

## 2017-01-29 DIAGNOSIS — R109 Unspecified abdominal pain: Secondary | ICD-10-CM

## 2017-01-29 DIAGNOSIS — Z7984 Long term (current) use of oral hypoglycemic drugs: Secondary | ICD-10-CM | POA: Insufficient documentation

## 2017-01-29 DIAGNOSIS — E119 Type 2 diabetes mellitus without complications: Secondary | ICD-10-CM | POA: Insufficient documentation

## 2017-01-29 LAB — CBC
HCT: 33.5 % — ABNORMAL LOW (ref 39.0–52.0)
HEMOGLOBIN: 10.8 g/dL — AB (ref 13.0–17.0)
MCH: 30 pg (ref 26.0–34.0)
MCHC: 32.2 g/dL (ref 30.0–36.0)
MCV: 93.1 fL (ref 78.0–100.0)
Platelets: 227 10*3/uL (ref 150–400)
RBC: 3.6 MIL/uL — AB (ref 4.22–5.81)
RDW: 12.2 % (ref 11.5–15.5)
WBC: 9.5 10*3/uL (ref 4.0–10.5)

## 2017-01-29 LAB — URINALYSIS, ROUTINE W REFLEX MICROSCOPIC
Bilirubin Urine: NEGATIVE
Glucose, UA: NEGATIVE mg/dL
Ketones, ur: NEGATIVE mg/dL
Leukocytes, UA: NEGATIVE
Nitrite: NEGATIVE
Protein, ur: 100 mg/dL — AB
SPECIFIC GRAVITY, URINE: 1.015 (ref 1.005–1.030)
pH: 6 (ref 5.0–8.0)

## 2017-01-29 LAB — COMPREHENSIVE METABOLIC PANEL
ALT: 16 U/L — ABNORMAL LOW (ref 17–63)
ANION GAP: 7 (ref 5–15)
AST: 17 U/L (ref 15–41)
Albumin: 3.6 g/dL (ref 3.5–5.0)
Alkaline Phosphatase: 52 U/L (ref 38–126)
BUN: 17 mg/dL (ref 6–20)
CALCIUM: 9.1 mg/dL (ref 8.9–10.3)
CHLORIDE: 104 mmol/L (ref 101–111)
CO2: 28 mmol/L (ref 22–32)
Creatinine, Ser: 1.29 mg/dL — ABNORMAL HIGH (ref 0.61–1.24)
GFR calc non Af Amer: >60 mL/min (ref >60)
Glucose, Bld: 165 mg/dL — ABNORMAL HIGH (ref 65–99)
Potassium: 4.3 mmol/L (ref 3.5–5.1)
SODIUM: 139 mmol/L (ref 135–145)
Total Bilirubin: 0.4 mg/dL (ref 0.3–1.2)
Total Protein: 6.6 g/dL (ref 6.5–8.1)

## 2017-01-29 LAB — URINALYSIS, MICROSCOPIC (REFLEX)
BACTERIA UA: NONE SEEN
SQUAMOUS EPITHELIAL / LPF: NONE SEEN
WBC, UA: NONE SEEN WBC/hpf (ref 0–5)

## 2017-01-29 LAB — LIPASE, BLOOD: LIPASE: 21 U/L (ref 11–51)

## 2017-01-29 MED ORDER — GI COCKTAIL ~~LOC~~
30.0000 mL | Freq: Once | ORAL | Status: AC
  Administered 2017-01-29: 30 mL via ORAL
  Filled 2017-01-29: qty 30

## 2017-01-29 MED ORDER — SUCRALFATE 1 G PO TABS: 1.0000 g | ORAL_TABLET | Freq: Three times a day (TID) | ORAL | 0 refills | Status: DC

## 2017-01-29 NOTE — Discharge Instructions (Signed)
Continue all current medications. Carafate in addition to help with pain. Follow up with gastroenterologist. Return if worsening symptoms.

## 2017-01-29 NOTE — ED Provider Notes (Signed)
[name] DEPT Provider Note   CSN: [id number] Arrival date & time: 01/29/17  1155     History   Chief Complaint Chief Complaint  Patient presents with  . Abdominal Pain    HPI Shane [name] is a 46 y.o. male.  HPI Shane [name] is a 46 y.o. male with hx of DM, HTN, anemia, bipolar disorder, GERD, Presents to emergency department complaining of abdominal pain, nausea, vomiting, diarrhea. Patient states that he has had symptoms for approximately a month but they are getting worse. He states now anytime he eats any solid food, he immediately has nausea, pain, diarrhea, and at times vomiting. He denies any blood in his stool or emesis. He states his stool is liquid and dark brown. His abdominal pain is epigastric. It does not radiate. His last bowel movement was this morning. Last episode of emesis was yesterday. He is able to keep liquids down. He states that he is taking Zofran for nausea which helps. He is also taking Imodium which does not help. He states he mentioned this to his doctor who told him that he is lactose intolerant and that is why he is having diarrhea.  Past Medical History:  Diagnosis Date  . Anemia   . Bipolar disorder ([place])   . Cellulitis and abscess of left leg 06/2016  . Chest pain    a. 2015 Reportedly normal stress test in [place].  Marland Kitchen Chronic diastolic CHF (congestive heart failure) (HCC)    a.03/2015 Echo: EF 55-60%, Gr 1 DD, mild MR, triv PR.  . Depression   . Depression with anxiety   . Dyspnea    with exertion  . GERD (gastroesophageal reflux disease)   . Hyperlipidemia   . Hypertension    a. 08/2014 Admitted with hypertensive urgency.  . Insomnia   . Internal carotid artery stenosis   . Lower GI bleed 07/04/2015  . Neuropathy   . Refusal of blood transfusions as patient is Jehovah's Witness   . TIA (transient ischemic attack) 08/2014; 03/2015   a. 08/2014 in setting of hypertensive urgency.  . Type II diabetes mellitus ([place])    started insulin  spring 2016, Type II  . Vitamin D deficiency spring 2016    Patient Active Problem List   Diagnosis Date Noted  . Achilles tendon contracture, left 01/14/2017  . Gingivitis 11/14/2016  . Achilles tendon contracture, right 09/17/2016  . Pain and swelling of left lower leg 09/16/2016  . CKD (chronic kidney disease) stage 3, GFR 30-59 ml/min 08/27/2016  . Dyslipidemia associated with type 2 diabetes mellitus ([place]) 07/22/2016  . Constipation 07/02/2016  . S/P transmetatarsal amputation of foot, left ([place]) 07/02/2016  . Dyspnea on exertion 07/02/2016  . Diabetic gastroparesis ([place]) 05/30/2016  . Buzzing in ear, right 03/26/2016  . Joint pain 03/11/2016  . Weakness 03/08/2016  . Dizziness 03/03/2016  . Chronic diastolic CHF (congestive heart failure), NYHA class 1 ([place]) 12/30/2015  . Diabetes mellitus type 2, uncontrolled, with complications ([place]) 03/54/6568  . Depression with anxiety 12/01/2015  . Pain in the chest 12/01/2015  . Insomnia 11/21/2015  . GERD (gastroesophageal reflux disease) 08/18/2015  . Erectile dysfunction 07/04/2015  . Symptomatic anemia 04/24/2015  . Left arm weakness 04/02/2015  . Sensory disturbance 04/02/2015  . Essential hypertension 04/02/2015  . Hemispheric carotid artery syndrome   . HLD (hyperlipidemia)   . Headache     Past Surgical History:  Procedure Laterality Date  . AMPUTATION Left 08/03/2015   Procedure: AMPUTATION LEFT GREAT TOE;  Surgeon: Newt Minion, MD;  Location: [facility];  Service: Orthopedics;  Laterality: Left;  . AMPUTATION Left 12/02/2015   Procedure: amputation of left 2nd digit  . AMPUTATION Left 04/10/2016   Procedure: Left 2nd Toe Amputation at MTP Joint;  Surgeon: Newt Minion, MD;  Location: [facility];  Service: Orthopedics;  Laterality: Left;  . AMPUTATION Left 06/09/2016   Procedure: AMPUTATION LEFT THIRD TOE;  Surgeon: [name], MD;  Location: [facility];  Service: Orthopedics;  Laterality: Left;  . AMPUTATION Left 06/26/2016    Procedure: Left Foot Transmetatarsal Amputation;  Surgeon: Newt Minion, MD;  Location: [facility];  Service: Orthopedics;  Laterality: Left;  . APPLICATION OF WOUND VAC Left 12/19/2015   Procedure: APPLICATION OF WOUND VAC;  Surgeon: Meredith Pel, MD;  Location: [facility];  Service: Orthopedics;  Laterality: Left;  . CIRCUMCISION    . COLONOSCOPY N/A 01/22/2016   Procedure: COLONOSCOPY;  Surgeon: Gatha Mayer, MD;  Location: [facility];  Service: Endoscopy;  Laterality: N/A;  . ESOPHAGOGASTRODUODENOSCOPY N/A 01/19/2016   Procedure: ESOPHAGOGASTRODUODENOSCOPY (EGD);  Surgeon: Manus Gunning, MD;  Location: [facility];  Service: Gastroenterology;  Laterality: N/A;  . ESOPHAGOGASTRODUODENOSCOPY N/A 01/22/2016   Procedure: ESOPHAGOGASTRODUODENOSCOPY (EGD);  Surgeon: Gatha Mayer, MD;  Location: [facility] ENDOSCOPY;  Service: Endoscopy;  Laterality: N/A;  . I&D EXTREMITY Left 12/02/2015   Procedure: IRRIGATION AND DEBRIDEMENT OF FOOT; LEFT SECOND TOE AMPUTATION;  Surgeon: Meredith Pel, MD;  Location: [facility];  Service: Orthopedics;  Laterality: Left;  . I&D EXTREMITY Left 12/19/2015   Procedure: I & D LEFT FOOT WITH BEADS ;  Surgeon: Meredith Pel, MD;  Location: [facility];  Service: Orthopedics;  Laterality: Left;  . I&D EXTREMITY Right 01/17/2016   Procedure: IRRIGATION AND DEBRIDEMENT RIGHT FOOT;  Surgeon: Newt Minion, MD;  Location: [facility];  Service: Orthopedics;  Laterality: Right;  . INCISION AND DRAINAGE FOOT Right 01/17/2016  . INGUINAL HERNIA REPAIR Bilateral ~ 1983- ~ 1986  . TONSILLECTOMY  ~ 1985       Home Medications    Prior to Admission medications   Medication Sig Start Date End Date Taking? Authorizing Provider  ACCU-CHEK SOFTCLIX LANCETS lancets Use as instructed 07/22/16  Yes Funches, Josalyn, MD  amLODipine (NORVASC) 10 MG tablet Take 1 tablet (10 mg total) by mouth daily. 01/20/17  Yes Funches, Josalyn, MD  ARIPiprazole (ABILIFY) 2 MG tablet Take 2 tablets (4 mg total) by  mouth daily. 11/04/16  Yes Eksir, Richard Miu, MD  aspirin EC 81 MG tablet Take 1 tablet (81 mg total) by mouth daily. 09/16/16  Yes Funches, Josalyn, MD  atorvastatin (LIPITOR) 80 MG tablet TAKE 1 TABLET BY MOUTH EVERY MORNING. 09/02/16  Yes Funches, Josalyn, MD  Blood Glucose Monitoring Suppl (ACCU-CHEK AVIVA PLUS) w/Device KIT Use as directed 08/06/16  Yes Funches, Josalyn, MD  carvedilol (COREG) 12.5 MG tablet TAKE 1 TABLET BY MOUTH 2 TIMES DAILY WITH A MEAL 12/17/16  Yes Funches, Josalyn, MD  FLUoxetine (PROZAC) 40 MG capsule Take 1 capsule (40 mg total) by mouth daily. 11/04/16  Yes Eksir, Richard Miu, MD  folic acid (FOLVITE) 1 MG tablet Take 1 tablet (1 mg total) by mouth daily. 07/04/16  Yes Asencion Partridge, MD  furosemide (LASIX) 40 MG tablet TAKE 1 TABLET BY MOUTH EVERY OTHER DAY. 11/05/16  Yes Funches, Josalyn, MD  gabapentin (NEURONTIN) 100 MG capsule TAKE 1 CAPSULE BY MOUTH 3 TIMES DAILY. 10/28/16  Yes Boykin Nearing, MD  glipiZIDE (GLUCOTROL XL) 10 MG 24 hr tablet Take 2 tablets (20 mg total) by mouth daily with breakfast. 09/03/16  Yes Funches, Josalyn, MD  glucose blood (ACCU-CHEK AVIVA PLUS) test strip Use as instructed 08/08/16  Yes Funches, Josalyn, MD  ibuprofen (ADVIL,MOTRIN) 800 MG tablet TAKE 1 TABLET BY MOUTH EVERY 8 HOURS AS NEEDED. Patient taking differently: TAKE 1 TABLET BY MOUTH EVERY 8 HOURS AS NEEDED FOR PAIN 01/10/17  Yes Funches, Josalyn, MD  Insulin Detemir (LEVEMIR FLEXPEN) 100 UNIT/ML Pen Inject 15 Units into the skin daily at 10 pm. 01/20/17  Yes Funches, Josalyn, MD  Insulin Pen Needle (B-D ULTRAFINE III SHORT PEN) 31G X 8 MM MISC 1 application by Does not apply route 3 (three) times daily. 08/02/16  Yes Boykin Nearing, MD  Lancet Devices [facility]) lancets Use as instructed 08/06/16  Yes Funches, Josalyn, MD  lisinopril (PRINIVIL,ZESTRIL) 40 MG tablet TAKE 1 TABLET BY MOUTH DAILY. 12/17/16  Yes Funches, Josalyn, MD  metFORMIN (GLUCOPHAGE) 1000 MG tablet TAKE 1  TABLET BY MOUTH 2 TIMES DAILY WITH A MEAL. 12/30/16  Yes Funches, Adriana Mccallum, MD  ondansetron (ZOFRAN ODT) 8 MG disintegrating tablet Take 1 tablet (8 mg total) by mouth every 8 (eight) hours as needed for nausea or vomiting. 11/12/16  Yes Funches, Josalyn, MD  pantoprazole (PROTONIX) 40 MG tablet TAKE 1 TABLET BY MOUTH DAILY. 12/17/16  Yes Funches, Josalyn, MD  polyethylene glycol (MIRALAX / GLYCOLAX) packet Take 17 g by mouth daily as needed for moderate constipation. 07/03/16  Yes Asencion Partridge, MD  ranitidine (ZANTAC) 300 MG tablet Take 1 tablet (300 mg total) by mouth at bedtime. 08/23/16  Yes Funches, Josalyn, MD  senna-docusate (SENOKOT-S) 8.6-50 MG tablet Take 1 tablet by mouth at bedtime. Patient taking differently: Take 1 tablet by mouth at bedtime as needed for mild constipation.  07/03/16  Yes Asencion Partridge, MD  traZODone (DESYREL) 100 MG tablet TAKE 2 TABLETS (200 MG TOTAL) BY MOUTH AT BEDTIME. 12/30/16  Yes Eksir, Richard Miu, MD  Vitamin D, Ergocalciferol, (DRISDOL) 50000 units CAPS capsule Take 1 capsule (50,000 Units total) by mouth every 30 (thirty) days. Every 30 days 04/26/16  Yes Boykin Nearing, MD  Elastic Bandages & Supports (T.E.D. KNEE LENGTH/S-REGULAR) MISC Wear when awake Patient not taking: Reported on 01/29/2017 09/20/16   Orpah Greek, MD  fluticasone (FLONASE) 50 MCG/ACT nasal spray Place 2 sprays into both nostrils daily. Patient not taking: Reported on 01/29/2017 09/01/16   Julianne Rice, MD    Family History Family History  Problem Relation Age of Onset  . Hypertension Mother   . Diabetes Mother   . Hyperlipidemia Mother   . Heart disease Mother        s/p pacemaker  . Diabetes Father   . Hypertension Father   . Stroke Father   . Heart attack Father        first MI @ 51.  . Depression Father   . Dementia Father   . Stroke Brother   . ADD / ADHD Brother   . Anxiety disorder Brother   . Bipolar disorder Brother   . OCD Brother   . Sexual abuse  Brother     Social History Social History  Substance Use Topics  . Smoking status: Never Smoker  . Smokeless tobacco: Never Used  . Alcohol use No     Allergies   Lactose intolerance (gi); Other; and Milk-related compounds   Review of Systems Review of Systems  Constitutional: Negative for chills  and fever.  Respiratory: Negative for cough, chest tightness and shortness of breath.   Cardiovascular: Negative for chest pain, palpitations and leg swelling.  Gastrointestinal: Positive for abdominal pain, diarrhea, nausea and vomiting. Negative for abdominal distention.  Genitourinary: Negative for dysuria, frequency, hematuria and urgency.  Musculoskeletal: Negative for arthralgias, myalgias, neck pain and neck stiffness.  Skin: Negative for rash.  Allergic/Immunologic: Negative for immunocompromised state.  Neurological: Negative for dizziness, weakness, light-headedness, numbness and headaches.  All other systems reviewed and are negative.    Physical Exam Updated Vital Signs BP 115/79   Pulse 79   Temp 98.1 F (36.7 C) (Oral)   Resp 16   Ht 5' 8"  (1.727 m)   Wt 88.9 kg (196 lb)   SpO2 100%   BMI 29.80 kg/m   Physical Exam  Constitutional: He appears well-developed and well-nourished. No distress.  HENT:  Head: Normocephalic and atraumatic.  Eyes: Conjunctivae are normal.  Neck: Neck supple.  Cardiovascular: Normal rate, regular rhythm and normal heart sounds.   Pulmonary/Chest: Effort normal. No respiratory distress. He has no wheezes. He has no rales.  Abdominal: Soft. Bowel sounds are normal. He exhibits no distension. There is no tenderness. There is no rebound.  Epigastric and RUQ tenderness  Musculoskeletal: He exhibits no edema.  Neurological: He is alert.  Skin: Skin is warm and dry.  Nursing note and vitals reviewed.    ED Treatments / Results  Labs (all labs ordered are listed, but only abnormal results are displayed) Labs Reviewed    COMPREHENSIVE METABOLIC PANEL - Abnormal; Notable for the following:       Result Value   Glucose, Bld 165 (*)    Creatinine, Ser 1.29 (*)    ALT 16 (*)    All other components within normal limits  CBC - Abnormal; Notable for the following:    RBC 3.60 (*)    Hemoglobin 10.8 (*)    HCT 33.5 (*)    All other components within normal limits  URINALYSIS, ROUTINE W REFLEX MICROSCOPIC - Abnormal; Notable for the following:    Hgb urine dipstick SMALL (*)    Protein, ur 100 (*)    All other components within normal limits  GASTROINTESTINAL PANEL BY PCR, STOOL (REPLACES STOOL CULTURE)  C DIFFICILE QUICK SCREEN W PCR REFLEX  LIPASE, BLOOD  URINALYSIS, MICROSCOPIC (REFLEX)    EKG  EKG Interpretation None       Radiology No results found.  Procedures Procedures (including critical care time)  Medications Ordered in ED Medications - No data to display   Initial Impression / Assessment and Plan / ED Course  I have reviewed the triage vital signs and the nursing notes.  Pertinent labs & imaging results that were available during my care of the patient were reviewed by me and considered in my medical decision making (see chart for details).     Patient seen and examined. Patient with worsening nausea, vomiting, diarrhea, abdominal pain that is worse after eating. Patient is nontoxic-appearing. Vital signs are normal at this time. He is afebrile. Abdomen is tender in epigastric area and right upper quadrant. Concerning for possible cholecystitis versus food sensitivity, versus infectious process. Will get stool cultures and test for C. difficile. We'll get ultrasound to look at the gallbladder. Labs already obtained at triage, no acute findings.  5:39 PM Patient is unable to provide [place] with a stool sample here in emergency department after several tries. His ultrasound is normal, gallbladder appears to be  normal. I suspect he either has infectious diarrhea, versus IBS, versus  gastritis. His pain continues to be just epigastric. I will place him on Carafate in addition to his Zantac and Protonix for his gastritis. Will have him follow-up with gastroenterologist. His abdomen is soft, VS normal, he is not vomiting, white blood cell count is normal, symptoms for a week, do not think he needs any further imaging on emergent basis.  Vitals:   01/29/17 1515 01/29/17 1530 01/29/17 1600 01/29/17 1630  BP: (!) 128/91 121/79 (!) 117/93 116/84  Pulse: 78 76 79 76  Resp:      Temp:      TempSrc:      SpO2: 100% 96% 96% 97%  Weight:      Height:         Final Clinical Impressions(s) / ED Diagnoses   Final diagnoses:  Abdominal pain  Epigastric pain  Diarrhea, unspecified type    New Prescriptions New Prescriptions   SUCRALFATE (CARAFATE) 1 G TABLET    Take 1 tablet (1 g total) by mouth 4 (four) times daily -  with meals and at bedtime.     Jeannett Senior, PA-C 01/29/17 1743    Lacretia Leigh, MD 01/30/17 2210

## 2017-01-29 NOTE — ED Triage Notes (Signed)
PT states epigastric pain, vomiting and diarrhea every x he eats x 1 month.  Was seen by pcp last week and was given zofran with no relief.  States blood sugars have in 130's at home.

## 2017-01-30 ENCOUNTER — Encounter: Payer: Self-pay | Admitting: Physician Assistant

## 2017-01-31 MED FILL — ARIPiprazole 2 MG TABS: 2 | 30 days supply | Qty: 60 | Fill #1

## 2017-02-06 ENCOUNTER — Other Ambulatory Visit: Payer: Self-pay | Admitting: Family Medicine

## 2017-02-06 MED FILL — ?PANTOPRAZOLE SOD DR 40MG: 40 MG | 30 days supply | Qty: 30 | Fill #1

## 2017-02-06 MED FILL — FLUoxetine HCL 40 MG CAPS: 40 | 30 days supply | Qty: 30 | Fill #3

## 2017-02-06 MED FILL — glipiZIDE ER 10 MG TB24: 10 | 30 days supply | Qty: 30 | Fill #9

## 2017-02-06 MED FILL — ATORVASTATIN 80 MG TABLET: 80 | 30 days supply | Qty: 30 | Fill #5

## 2017-02-14 ENCOUNTER — Encounter: Payer: Self-pay | Admitting: Physician Assistant

## 2017-02-14 ENCOUNTER — Ambulatory Visit (INDEPENDENT_AMBULATORY_CARE_PROVIDER_SITE_OTHER): Payer: No Typology Code available for payment source | Admitting: Physician Assistant

## 2017-02-14 VITALS — BP 146/96 | HR 88 | Ht 67.5 in | Wt 192.1 lb

## 2017-02-14 DIAGNOSIS — R1013 Epigastric pain: Secondary | ICD-10-CM

## 2017-02-14 DIAGNOSIS — R197 Diarrhea, unspecified: Secondary | ICD-10-CM

## 2017-02-14 DIAGNOSIS — R112 Nausea with vomiting, unspecified: Secondary | ICD-10-CM

## 2017-02-14 DIAGNOSIS — K219 Gastro-esophageal reflux disease without esophagitis: Secondary | ICD-10-CM

## 2017-02-14 MED ORDER — PANTOPRAZOLE SODIUM 40 MG PO TBEC: 40.0000 mg | DELAYED_RELEASE_TABLET | Freq: Two times a day (BID) | ORAL | 3 refills | Status: DC

## 2017-02-14 MED ORDER — SUCRALFATE 1 G PO TABS: 1.0000 g | ORAL_TABLET | Freq: Three times a day (TID) | ORAL | 0 refills | Status: DC

## 2017-02-14 MED FILL — ?PANTOPRAZOLE SOD DR 40MG: 40 MG | 30 days supply | Qty: 60 | Fill #0

## 2017-02-14 MED FILL — SUCRALFATE 1 GM TABLET: 1 | 30 days supply | Qty: 120 | Fill #0

## 2017-02-14 NOTE — Progress Notes (Signed)
Agree with assessment and plan as outlined. Of note, patient with a history of ongoing mild anemia with normal iron stores. S/p EGD and colonoscopy without abnormality. Agree with recommendations as outlined. He can use immodium PRN for diarrhea. Sounds like he may have IBS, may consider course of Rifaximin pending his course.

## 2017-02-14 NOTE — Patient Instructions (Signed)
Please discuss stopping Metformin with Dr. Wynetta Emery.   Increase Pantoprazole to 40 mg twice a day. 30-60 mins before breakfast and dinner.   Try Carafate as prescribed one hour before or 2 hours after other medications.

## 2017-02-14 NOTE — Progress Notes (Signed)
Chief Complaint: Epigastric abdominal pain, diarrhea, nausea and vomiting  HPI:  [name] is a 46 year old African-American male with a past medical history as listed below, who regularly follows with Dr. Havery Moros and was referred to me by Boykin Nearing, MD for a complaint of epigastric abdominal pain, diarrhea, nausea and vomiting .     Patient had a recent EGD and colonoscopy by Dr. Carlean Purl while hospitalized. These were completed 01/22/16. Colonoscopy revealed internal hemorrhoids and was otherwise normal. EGD was normal.   Patient was seen in the ER 01/29/17 for abdominal pain. He had an ultrasound which was normal. He also had labs including a CMP and CBC and urinalysis. CBC showed a hemoglobin minimally decreased to 10.8, though this is around patient's average.   Today, the patient presents to clinic and is a poor historian. He describes that when he eats pretty much anything he will have epigastric abdominal pain accompanied by diarrhea as well as occasionally some nausea and gas and bloating. Typically he only has nausea sometimes upon waking in the morning and occasionally vomits. Patient exaplins that particularly dairy seems to bother him so he has been trying to avoid this, but has also noticed reaction with now all the foods that he is eating. Patient tells me even occasionally he has some diarrhea while he is asleep" I don't know it and I wake up and it's there". He also describes some subjective fever and chills. Patient tells me all of these symptoms have been going on for least 3 or 4 years. Interestingly, the patient was started on Metformin around that time. Patient is currently on Pantoprazole 40 mg once daily as well as Zantac 300 mg at bedtime. He occasionally uses Ibuprofen 800 mg but this is only once every few weeks. Patient was recently prescribed Carafate when the ED but has not started this yet.   Patient denies blood in his stool, melena, weight loss, anorexia, dysphagia  or symptoms that awaken him at night.  Past Medical History:  Diagnosis Date  . Anemia   . Bipolar disorder ([place])   . Cellulitis and abscess of left leg 06/2016  . Chest pain    a. 2015 Reportedly normal stress test in [place].  Marland Kitchen Chronic diastolic CHF (congestive heart failure) (H[facility])    a.03/2015 Echo: EF 55-60%, Gr 1 DD, mild MR, triv PR.  . Depression   . Depression with anxiety   . Dyspnea    with exertion  . GERD (gastroesophageal reflux disease)   . Glaucoma   . Hyperlipidemia   . Hypertension    a. 08/2014 Admitted with hypertensive urgency.  . Insomnia   . Internal carotid artery stenosis   . Lower GI bleed 07/04/2015  . Neuropathy   . Refusal of blood transfusions as patient is Jehovah's Witness   . TIA (transient ischemic attack) 08/2014; 03/2015   a. 08/2014 in setting of hypertensive urgency.  . Type II diabetes mellitus ([place])    started insulin spring 2016, Type II  . Vitamin D deficiency spring 2016    Past Surgical History:  Procedure Laterality Date  . AMPUTATION Left 08/03/2015   Procedure: AMPUTATION LEFT GREAT TOE;  Surgeon: Newt Minion, MD;  Location: [facility];  Service: Orthopedics;  Laterality: Left;  . AMPUTATION Left 12/02/2015   Procedure: amputation of left 2nd digit  . AMPUTATION Left 04/10/2016   Procedure: Left 2nd Toe Amputation at MTP Joint;  Surgeon: Newt Minion, MD;  Location: [place];  Service: Orthopedics;  Laterality: Left;  . AMPUTATION Left 06/09/2016   Procedure: AMPUTATION LEFT THIRD TOE;  Surgeon: [name], MD;  Location: [place];  Service: Orthopedics;  Laterality: Left;  . AMPUTATION Left 06/26/2016   Procedure: Left Foot Transmetatarsal Amputation;  Surgeon: Newt Minion, MD;  Location: [place];  Service: Orthopedics;  Laterality: Left;  . APPLICATION OF WOUND VAC Left 12/19/2015   Procedure: APPLICATION OF WOUND VAC;  Surgeon: Meredith Pel, MD;  Location: [place];  Service: Orthopedics;  Laterality: Left;  . CIRCUMCISION    .  COLONOSCOPY N/A 01/22/2016   Procedure: COLONOSCOPY;  Surgeon: Gatha Mayer, MD;  Location: [facility];  Service: Endoscopy;  Laterality: N/A;  . ESOPHAGOGASTRODUODENOSCOPY N/A 01/19/2016   Procedure: ESOPHAGOGASTRODUODENOSCOPY (EGD);  Surgeon: Manus Gunning, MD;  Location: [facility];  Service: Gastroenterology;  Laterality: N/A;  . ESOPHAGOGASTRODUODENOSCOPY N/A 01/22/2016   Procedure: ESOPHAGOGASTRODUODENOSCOPY (EGD);  Surgeon: Gatha Mayer, MD;  Location: [facility] ENDOSCOPY;  Service: Endoscopy;  Laterality: N/A;  . I&D EXTREMITY Left 12/02/2015   Procedure: IRRIGATION AND DEBRIDEMENT OF FOOT; LEFT SECOND TOE AMPUTATION;  Surgeon: Meredith Pel, MD;  Location: [place];  Service: Orthopedics;  Laterality: Left;  . I&D EXTREMITY Left 12/19/2015   Procedure: I & D LEFT FOOT WITH BEADS ;  Surgeon: Meredith Pel, MD;  Location: [place];  Service: Orthopedics;  Laterality: Left;  . I&D EXTREMITY Right 01/17/2016   Procedure: IRRIGATION AND DEBRIDEMENT RIGHT FOOT;  Surgeon: Newt Minion, MD;  Location: [place];  Service: Orthopedics;  Laterality: Right;  . INCISION AND DRAINAGE FOOT Right 01/17/2016  . INGUINAL HERNIA REPAIR Bilateral ~ 1983- ~ 1986  . TONSILLECTOMY  ~ 1985    Current Outpatient Prescriptions  Medication Sig Dispense Refill  . ACCU-CHEK SOFTCLIX LANCETS lancets Use as instructed 100 each 12  . amLODipine (NORVASC) 10 MG tablet Take 1 tablet (10 mg total) by mouth daily. 30 tablet 11  . ARIPiprazole (ABILIFY) 2 MG tablet Take 2 tablets (4 mg total) by mouth daily. 90 tablet 1  . aspirin EC 81 MG tablet Take 1 tablet (81 mg total) by mouth daily. 30 tablet 11  . atorvastatin (LIPITOR) 80 MG tablet TAKE 1 TABLET BY MOUTH EVERY MORNING. 30 tablet 2  . Blood Glucose Monitoring Suppl (ACCU-CHEK AVIVA PLUS) w/Device KIT Use as directed 1 kit 0  . carvedilol (COREG) 12.5 MG tablet TAKE 1 TABLET BY MOUTH 2 TIMES DAILY WITH A MEAL 60 tablet 3  . Elastic Bandages & Supports (T.E.D.  KNEE LENGTH/S-REGULAR) MISC Wear when awake 1 each 0  . FLUoxetine (PROZAC) 40 MG capsule Take 1 capsule (40 mg total) by mouth daily. 90 capsule 1  . fluticasone (FLONASE) 50 MCG/ACT nasal spray Place 2 sprays into both nostrils daily. 16 g 0  . folic acid (FOLVITE) 1 MG tablet Take 1 tablet (1 mg total) by mouth daily. 30 tablet 3  . furosemide (LASIX) 40 MG tablet TAKE 1 TABLET BY MOUTH EVERY OTHER DAY. 30 tablet 1  . gabapentin (NEURONTIN) 100 MG capsule TAKE 1 CAPSULE BY MOUTH 3 TIMES DAILY. 90 capsule 3  . glipiZIDE (GLUCOTROL XL) 10 MG 24 hr tablet Take 2 tablets (20 mg total) by mouth daily with breakfast. 60 tablet 5  . glucose blood (ACCU-CHEK AVIVA PLUS) test strip Use as instructed 100 each 12  . ibuprofen (ADVIL,MOTRIN) 800 MG tablet TAKE 1 TABLET BY MOUTH EVERY 8 HOURS AS NEEDED. (Patient taking differently:  TAKE 1 TABLET BY MOUTH EVERY 8 HOURS AS NEEDED FOR PAIN) 30 tablet 0  . Insulin Detemir (LEVEMIR FLEXPEN) 100 UNIT/ML Pen Inject 15 Units into the skin daily at 10 pm. 6 mL 3  . Insulin Pen Needle (B-D ULTRAFINE III SHORT PEN) 31G X 8 MM MISC 1 application by Does not apply route 3 (three) times daily. 90 each 11  . Lancet Devices (ACCU-CHEK SOFTCLIX) lancets Use as instructed 1 each 0  . lisinopril (PRINIVIL,ZESTRIL) 40 MG tablet TAKE 1 TABLET BY MOUTH DAILY. 30 tablet 2  . metFORMIN (GLUCOPHAGE) 1000 MG tablet TAKE 1 TABLET BY MOUTH 2 TIMES DAILY WITH A MEAL. 60 tablet 2  . ondansetron (ZOFRAN ODT) 8 MG disintegrating tablet Take 1 tablet (8 mg total) by mouth every 8 (eight) hours as needed for nausea or vomiting. 30 tablet 0  . polyethylene glycol (MIRALAX / GLYCOLAX) packet Take 17 g by mouth daily as needed for moderate constipation. 14 each 0  . ranitidine (ZANTAC) 300 MG tablet Take 1 tablet (300 mg total) by mouth at bedtime. 30 tablet 5  . senna-docusate (SENOKOT-S) 8.6-50 MG tablet Take 1 tablet by mouth at bedtime. (Patient taking differently: Take 1 tablet by mouth at  bedtime as needed for mild constipation. ) 60 tablet 2  . sucralfate (CARAFATE) 1 g tablet Take 1 tablet (1 g total) by mouth 4 (four) times daily -  with meals and at bedtime. 120 tablet 0  . traZODone (DESYREL) 100 MG tablet TAKE 2 TABLETS (200 MG TOTAL) BY MOUTH AT BEDTIME. 90 tablet 1  . Vitamin D, Ergocalciferol, (DRISDOL) 50000 units CAPS capsule Take 1 capsule (50,000 Units total) by mouth every 30 (thirty) days. Every 30 days 12 capsule 1  . pantoprazole (PROTONIX) 40 MG tablet Take 1 tablet (40 mg total) by mouth 2 (two) times daily before a meal. 60 tablet 3   No current facility-administered medications for this visit.     Allergies as of 02/14/2017 - Review Complete 02/14/2017  Allergen Reaction Noted  . Lactose intolerance (gi) Diarrhea and Other (See Comments) 06/08/2016  . Other Other (See Comments) 07/15/2015  . Milk-related compounds Diarrhea and Other (See Comments) 02/19/2015    Family History  Problem Relation Age of Onset  . Hypertension Mother   . Diabetes Mother   . Hyperlipidemia Mother   . Heart disease Mother        s/p pacemaker  . Diabetes Father   . Hypertension Father   . Stroke Father   . Heart attack Father        first MI @ 81.  . Depression Father   . Dementia Father   . Stroke Brother   . ADD / ADHD Brother   . Anxiety disorder Brother   . Bipolar disorder Brother   . OCD Brother   . Sexual abuse Brother     Social History   Social History  . Marital status: Married    Spouse name: N/A  . Number of children: 1  . Years of education: N/A   Occupational History  . disabled    Social History Main Topics  . Smoking status: Never Smoker  . Smokeless tobacco: Never Used  . Alcohol use No  . Drug use: No  . Sexual activity: No   Other Topics Concern  . Not on file   Social History Narrative   Lives in [place] with wife.  Active but doesn't routinely exercise.    Review of Systems:  Constitutional: No weight loss Skin: No rash    Cardiovascular: No chest pain Respiratory: No SOB  Gastrointestinal: See HPI and otherwise negative Genitourinary: No dysuria Neurological: No headache Musculoskeletal: No new muscle or joint pain Hematologic: No bleeding Psychiatric: Positive for depression, anxiety and bipolar disorder   Physical Exam:  Vital signs: BP (!) 146/96 (BP Location: Left Arm, Patient Position: Sitting, Cuff Size: Normal)   Pulse 88   Ht 5' 7.5" (1.715 m) Comment: height measured without shoes  Wt 192 lb 2 oz (87.1 kg)   BMI 29.65 kg/m   Constitutional:   Pleasant AA male appears to be in NAD, Well developed, Well nourished, alert and cooperative Head:  Normocephalic and atraumatic. Eyes:   PEERL, EOMI. No icterus. Conjunctiva pink. Ears:  Normal auditory acuity. Neck:  Supple Throat: Oral cavity and pharynx without inflammation, swelling or lesion.  Respiratory: Respirations even and unlabored. Lungs clear to auscultation bilaterally.   No wheezes, crackles, or rhonchi.  Cardiovascular: Normal S1, S2. No MRG. Regular rate and rhythm. No peripheral edema, cyanosis or pallor.  Gastrointestinal:  Soft, nondistended, mild epigastric ttp. No rebound or guarding. Normal bowel sounds. No appreciable masses or hepatomegaly. Rectal:  Not performed.  Msk:  Symmetrical without gross deformities. Without edema, no deformity or joint abnormality. Ambulates with a cane Neurologic:  Alert and  oriented x4;  grossly normal neurologically.  Skin:   Dry and intact without significant lesions or rashes. Psychiatric:  Demonstrates good judgement and reason without abnormal affect or behaviors.  MOST RECENT LABS AND IMAGING: CBC    Component Value Date/Time   WBC 9.5 01/29/2017 1200   RBC 3.60 (L) 01/29/2017 1200   HGB 10.8 (L) 01/29/2017 1200   HGB 10.6 (L) 01/20/2017 1032   HGB 9.8 (L) 02/09/2016 1030   HCT 33.5 (L) 01/29/2017 1200   HCT 32.4 (L) 01/20/2017 1032   HCT 30.7 (L) 02/09/2016 1030   PLT 227  01/29/2017 1200   PLT 301 01/20/2017 1032   MCV 93.1 01/29/2017 1200   MCV 93 01/20/2017 1032   MCV 94.5 02/09/2016 1030   MCH 30.0 01/29/2017 1200   MCHC 32.2 01/29/2017 1200   RDW 12.2 01/29/2017 1200   RDW 13.1 01/20/2017 1032   RDW 14.3 02/09/2016 1030   LYMPHSABS 2.3 09/20/2016 0002   LYMPHSABS 1.9 02/09/2016 1030   MONOABS 0.5 09/20/2016 0002   MONOABS 0.4 02/09/2016 1030   EOSABS 0.3 09/20/2016 0002   EOSABS 0.3 02/09/2016 1030   BASOSABS 0.0 09/20/2016 0002   BASOSABS 0.1 02/09/2016 1030    CMP     Component Value Date/Time   NA 139 01/29/2017 1200   K 4.3 01/29/2017 1200   CL 104 01/29/2017 1200   CO2 28 01/29/2017 1200   GLUCOSE 165 (H) 01/29/2017 1200   BUN 17 01/29/2017 1200   CREATININE 1.29 (H) 01/29/2017 1200   CREATININE 1.79 (H) 08/02/2016 1409   CALCIUM 9.1 01/29/2017 1200   PROT 6.6 01/29/2017 1200   ALBUMIN 3.6 01/29/2017 1200   AST 17 01/29/2017 1200   ALT 16 (L) 01/29/2017 1200   ALKPHOS 52 01/29/2017 1200   BILITOT 0.4 01/29/2017 1200   GFRNONAA >60 01/29/2017 1200   GFRNONAA 44 (L) 08/02/2016 1409   GFRAA >60 01/29/2017 1200   GFRAA 51 (L) 08/02/2016 1409   ABDOMEN ULTRASOUND COMPLETE 01/29/17  COMPARISON:  None.  FINDINGS: Gallbladder: No gallstones or wall thickening visualized. No sonographic Murphy sign noted by sonographer.  Common bile duct: Diameter:  2.9 mm  Liver: No focal lesion identified. Within normal limits in parenchymal echogenicity.  IVC: No abnormality visualized.  Pancreas: Not visualized due to overlying bowel gas.  Spleen: Size and appearance within normal limits.  Right Kidney: Length: 11.9 cm. Echogenicity within normal limits. No mass or hydronephrosis visualized.  Left Kidney: Length: 11.9 cm. Echogenicity within normal limits. No mass or hydronephrosis visualized.  Abdominal aorta: No aneurysm visualized. Aorta from mid aorta to the bifurcation is not visualized due to bowel gas.  Other  findings: None.  IMPRESSION: Normal abdominal ultrasound.   Electronically Signed   By: Kristine Garbe M.D.   On: 01/29/2017 17:02  Assessment: 1. Diarrhea: Started around 3-4 years ago with all of his other GI symptoms, around the same time that he started Metformin, question relation to Metformin versus IBS 2. GERD: With all of above, patient describes epigastric pain and nausea upon waking 3. Epigastric pain: See above 4. Nausea and vomiting: See above  Plan: 1. Would recommend the patient discuss stopping his Metformin with his primary care provider today. He does have an appointment with them on the 17th. It may be that his constellation of GI issues is a result of this medication. 2. Would recommend the patient start Carafate as prescribed. He should take this medication an hour before or 2 hours after other medications. 3. Increased patient's Pantoprazole to 40 mg twice a day, 30-60 minutes before eating. 4. Reviewed antireflux diet and lifestyle modifications. 5. Discussed a dairy free diet with the patient and the use of Lactaid 6. Patient to follow in clinic with [place] in 3-4 weeks. Either with Dr. Havery Moros or myself.  Ellouise Newer, PA-C [facility] 02/14/2017, 11:51 AM  Cc: Boykin Nearing, MD

## 2017-02-17 IMAGING — MR MR ANKLE*L* W/O CM
6 of 8 series · 28 of 40 positions shown · non-contrast
Comparison: 12/17/2015, 12/01/2015

CLINICAL DATA: Bleeding at the surgical site. Fever, chills,
diabetic foot ulcer

EXAM:
MRI OF THE LEFT FOREFOOT WITHOUT CONTRAST; MRI OF THE LEFT ANKLE
WITHOUT CONTRAST
TECHNIQUE: Multiplanar, multisequence MR imaging was performed. No intravenous
contrast was administered.

[Series 4: T1 · axial · 3.0mm · 0.29mm/px · z∈[-80,+55]mm · 6 of 42 slices shown (1 of 2)]
[im 1/42]
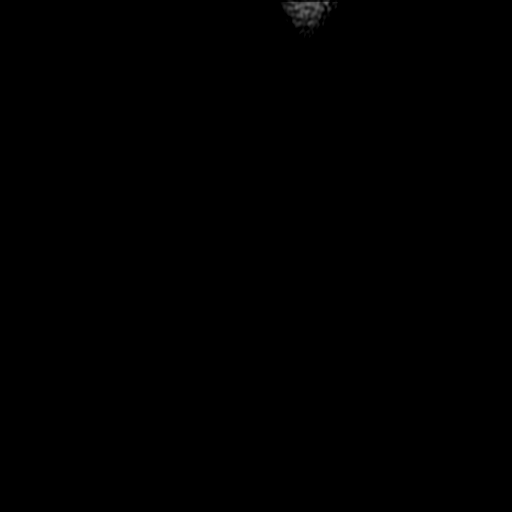
[im 9/42]
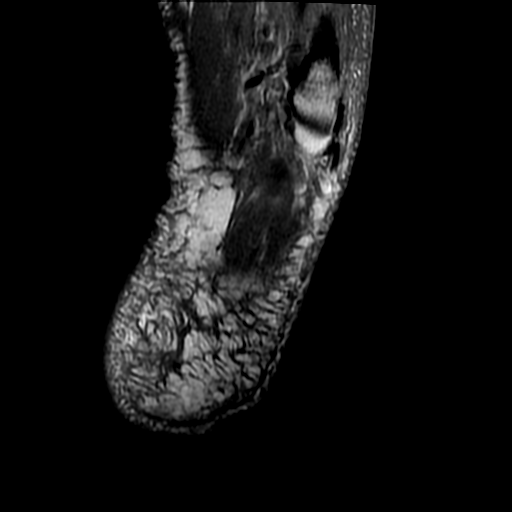
[im 17/42]
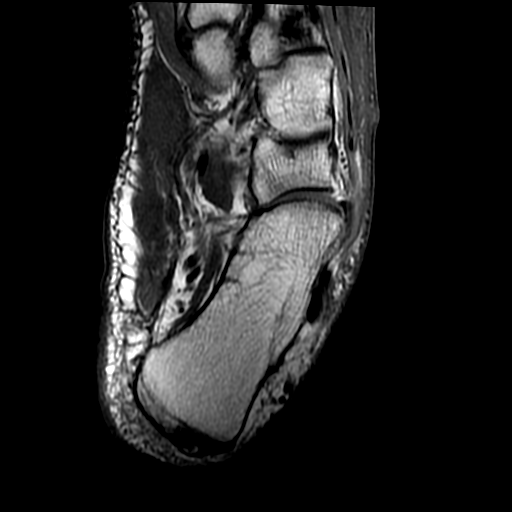
[im 25/42]
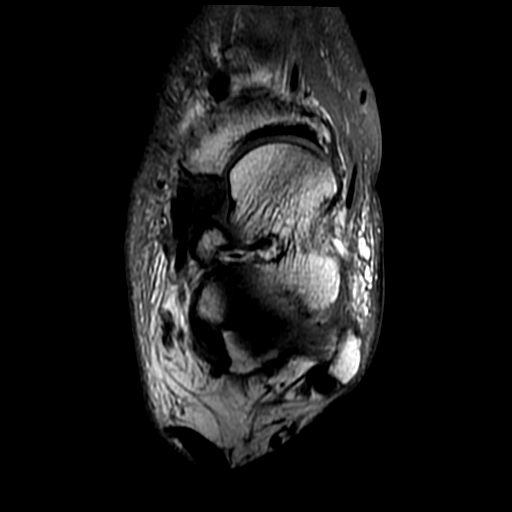
[im 33/42]
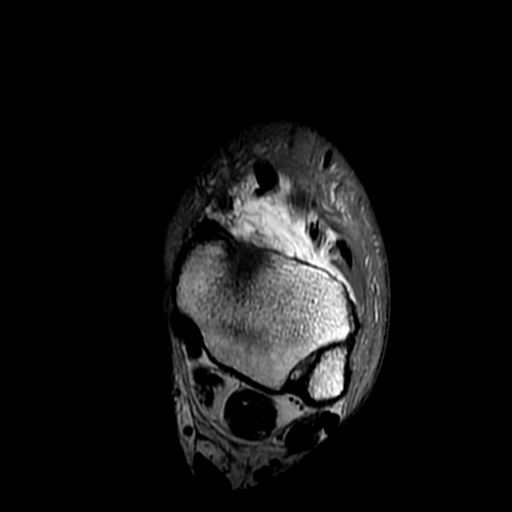
[im 42/42]
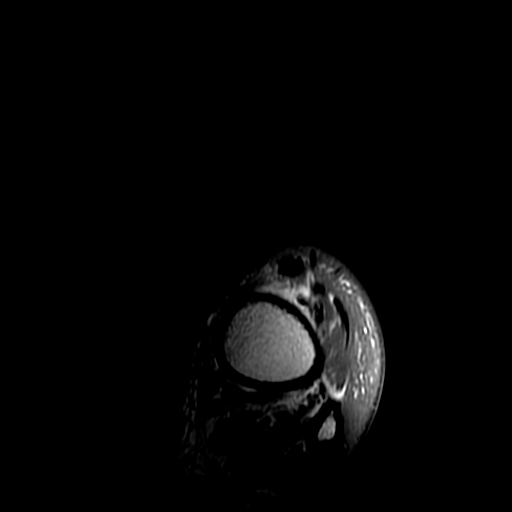

[Series 5: T1 fat-sat · axial · non-contrast · 3.0mm · 0.29mm/px · z∈[-80,+55]mm · 5 of 42 slices shown]
[im 1/42]
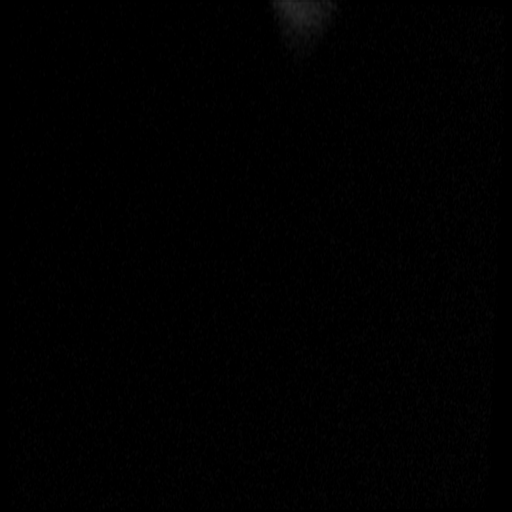
[im 11/42]
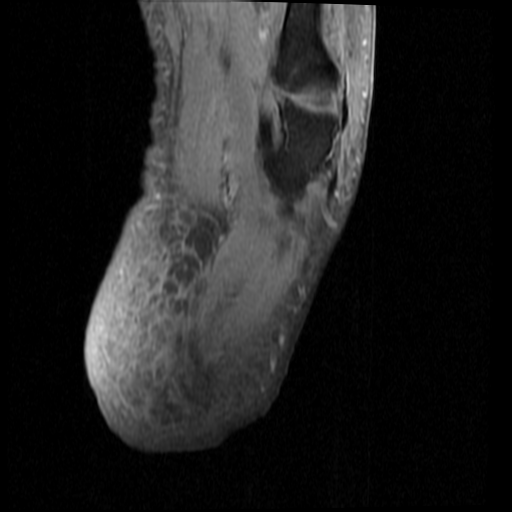
[im 21/42]
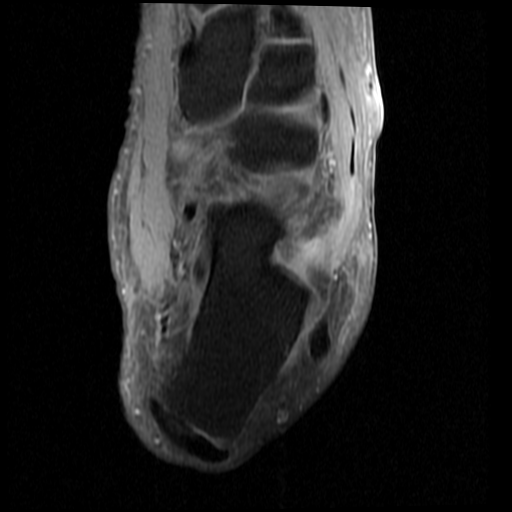
[im 31/42]
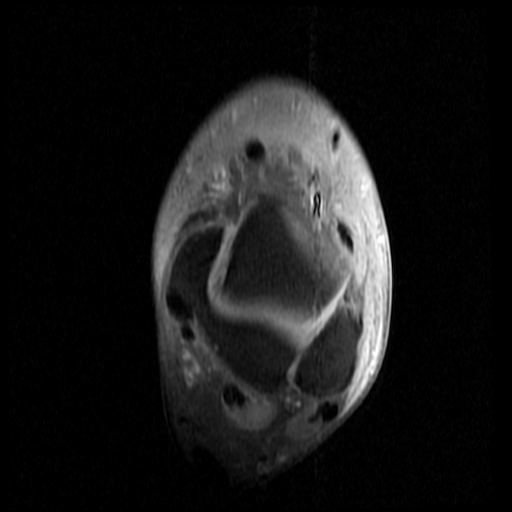
[im 42/42]
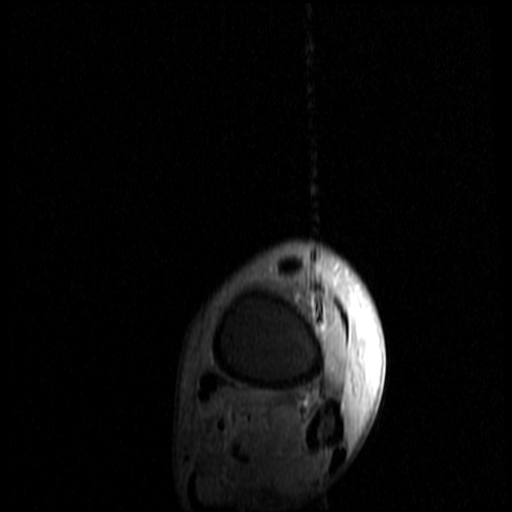

[Series 6: PD fat-sat · axial · 3.0mm · 0.59mm/px · z∈[-80,+55]mm · 5 of 42 slices shown]
[im 1/42]
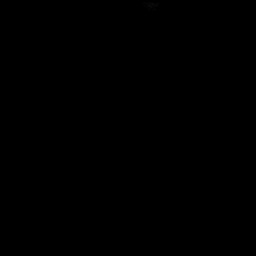
[im 11/42]
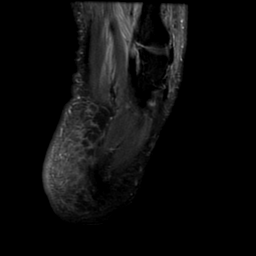
[im 21/42]
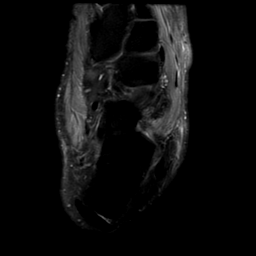
[im 31/42]
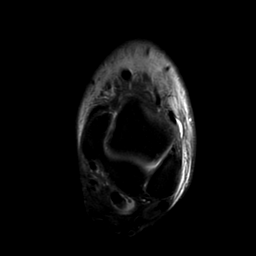
[im 42/42]
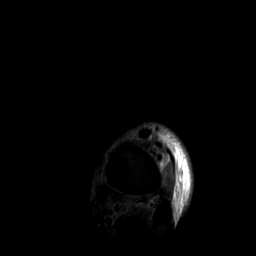

[Series 7: T2 fat-sat · axial · 3.0mm · 0.59mm/px · z∈[-80,+55]mm · 5 of 42 slices shown (1 of 2)]
[im 1/42]
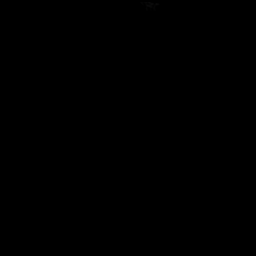
[im 11/42]
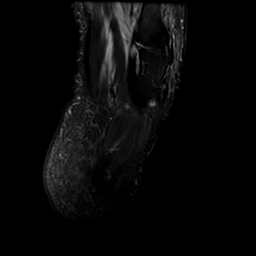
[im 21/42]
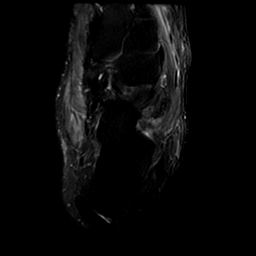
[im 31/42]
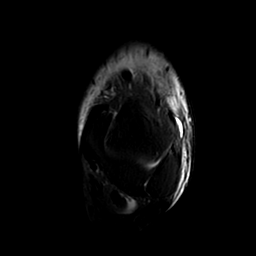
[im 42/42]
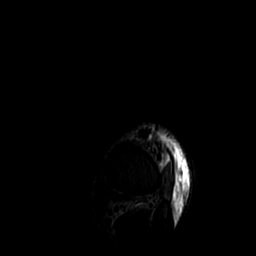

[Series 11: T1 · coronal · 3.0mm · 0.29mm/px · 2 of 44 slices shown (2 of 2)]
[im 1/44]
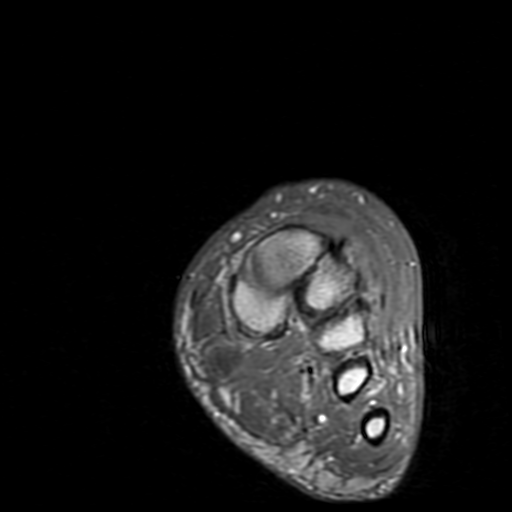
[im 11/44]
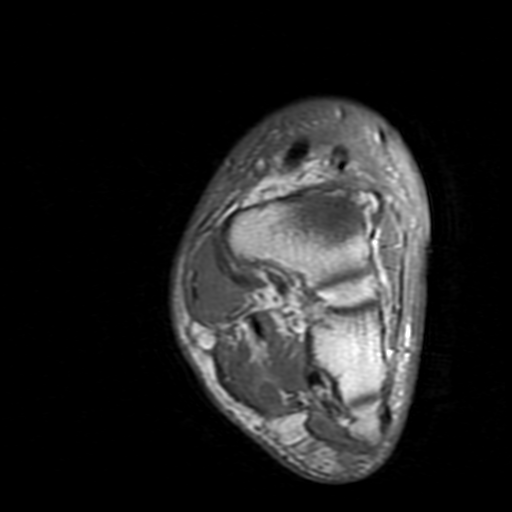

[Series 13: T2 fat-sat · coronal · 3.0mm · 0.59mm/px · 5 of 44 slices shown (2 of 2)]
[im 1/44]
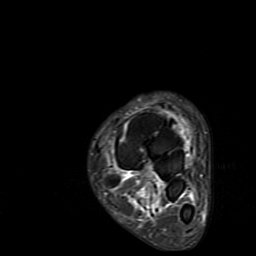
[im 11/44]
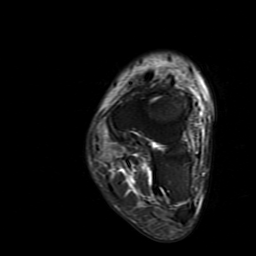
[im 22/44]
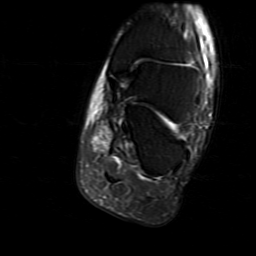
[im 33/44]
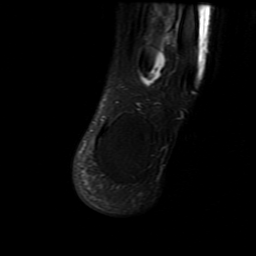
[im 44/44]
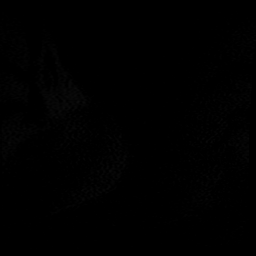

[28 of 40 positions shown; findings below may reference images not displayed]

FINDINGS: TENDONS

Peroneal: Peroneal longus tendon intact. Peroneal brevis intact.

Posteromedial: Posterior tibial tendon intact. Flexor hallucis
longus tendon intact. Flexor digitorum longus tendon intact.

Anterior: Tibialis anterior tendon intact. Extensor hallucis longus
tendon intact Extensor digitorum longus tendon intact.

Achilles:  Intact.

Plantar Fascia: Intact.

LIGAMENTS

Lateral: Thickening of the anterior and posterior talofibular
ligaments likely secondary to prior injury without disruption.
Anterior and posterior tibiofibular ligaments intact.

Medial: Deltoid ligament intact. Spring ligament intact.

CARTILAGE

Ankle Joint: No joint effusion. Normal ankle mortise. No chondral
defect.

Subtalar Joints/Sinus Tarsi: Normal subtalar joints. No subtalar
joint effusion. Normal sinus tarsi.

Bones: Amputation of the first phalanx. Mild subcortical reactive
marrow edema in the first metatarsal head without significant T1
marrow signal abnormality or cortical destruction suggesting
reactive marrow edema. Amputation of the second middle and distal
phalanx. Cortical irregularity of the distal aspect of the second
proximal phalanx with marrow edema which may reflect osteomyelitis
versus postsurgical changes.

No other marrow signal abnormality. No acute fracture or
dislocation.

Soft Tissue: Soft tissue wound along the dorsal aspect of the foot
with underlying mild soft tissue edema involving the dorsal aspect
of the foot extending into the ankle. Small amount of air is seen
tracking along the dorsal soft tissues. There is severe soft tissue
edema between the first and second and second and third metatarsals
concerning for infectious myositis. There is diffuse T2 signal
throughout the plantar musculature likely neurogenic.
IMPRESSION: 1. Soft tissue wound along the dorsal aspect of the foot with
underlying mild soft tissue edema involving the dorsal aspect of the
foot extending into the ankle concerning for cellulitis. Small
amount of air is seen tracking along the dorsal soft tissues. Severe
soft tissue edema between the first and second and second and third
metatarsals concerning for infectious myositis.
2. Amputation of the first phalanx. Mild subcortical reactive marrow
edema in the first metatarsal head without significant T1 marrow
signal abnormality or cortical destruction suggesting reactive
marrow edema.
3. Amputation of the second middle and distal phalanx. Cortical
irregularity of the distal aspect of the second proximal phalanx
with marrow edema which may reflect osteomyelitis versus
postsurgical changes.
4. There is diffuse T2 signal throughout the plantar musculature
likely neurogenic.

## 2017-02-17 IMAGING — MR MR FOOT*L* W/O CM
4 of 7 series · 23 of 40 positions shown · non-contrast
Comparison: 12/17/2015, 12/01/2015

CLINICAL DATA: Bleeding at the surgical site. Fever, chills,
diabetic foot ulcer

EXAM:
MRI OF THE LEFT FOREFOOT WITHOUT CONTRAST; MRI OF THE LEFT ANKLE
WITHOUT CONTRAST
TECHNIQUE: Multiplanar, multisequence MR imaging was performed. No intravenous
contrast was administered.

[Series 8: T1 · coronal · 3.0mm · 0.27mm/px · 7 of 57 slices shown]
[im 1/57]
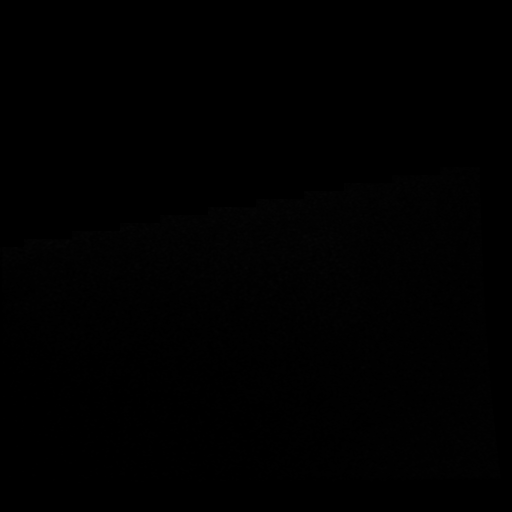
[im 10/57]
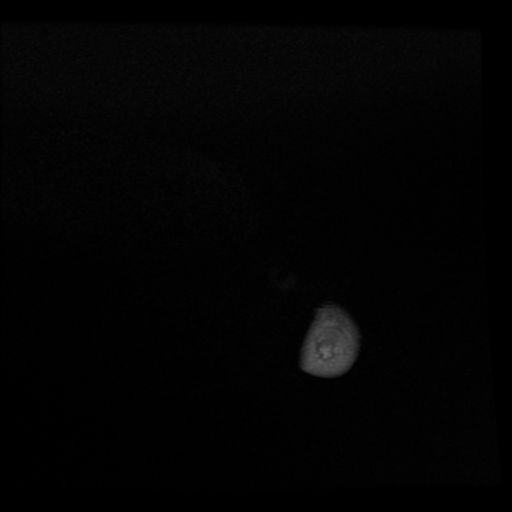
[im 19/57]
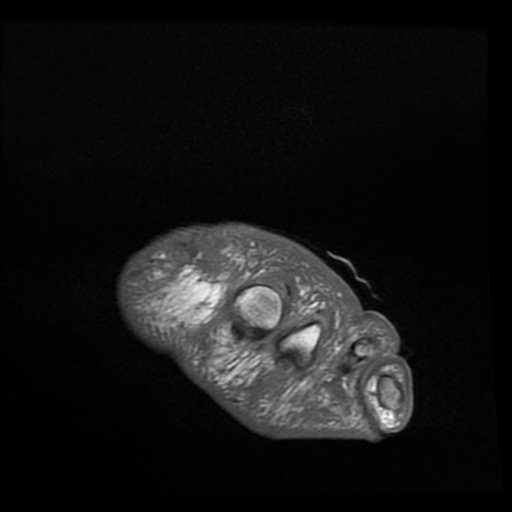
[im 29/57]
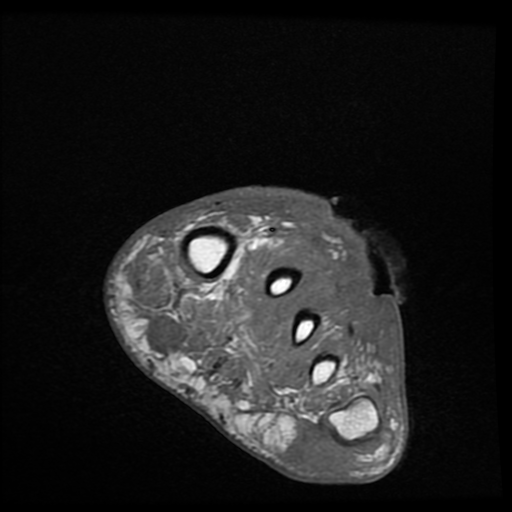
[im 38/57]
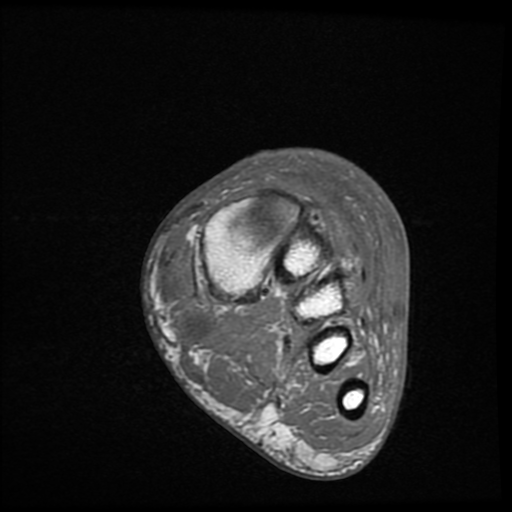
[im 47/57]
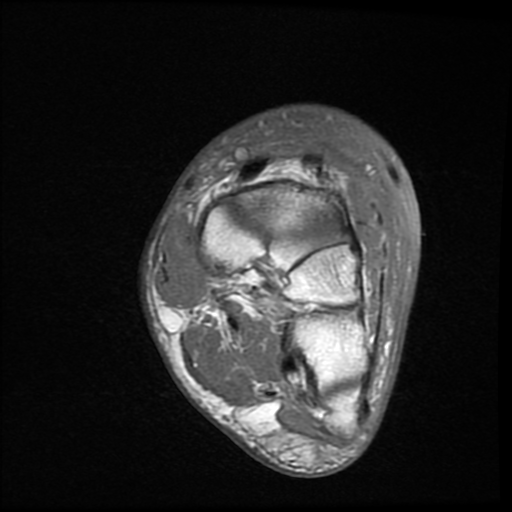
[im 57/57]
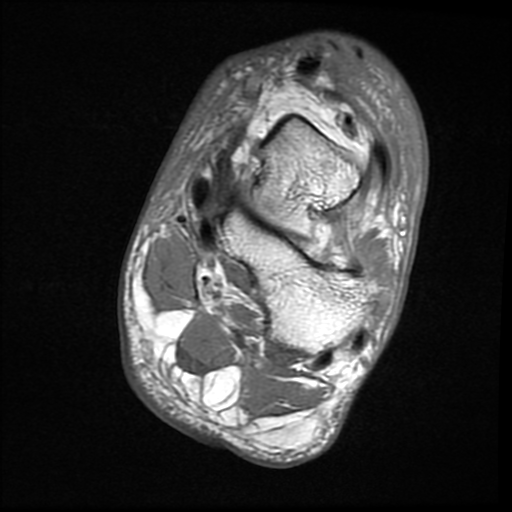

[Series 9: T1 fat-sat · coronal · non-contrast · 3.0mm · 0.27mm/px · 4 of 57 slices shown]
[im 1/57]
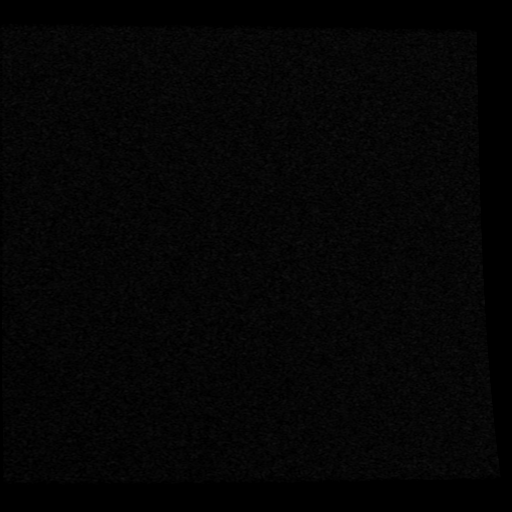
[im 9/57]
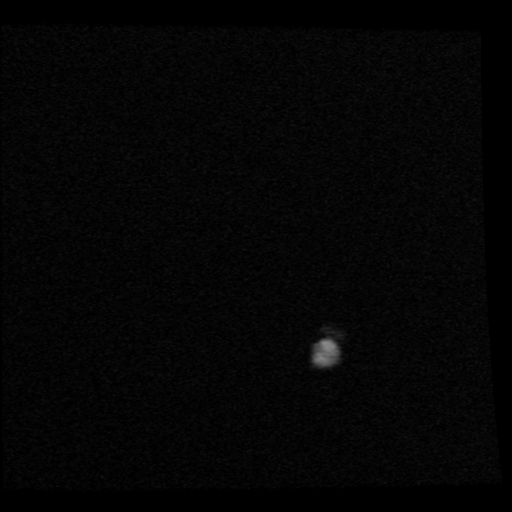
[im 33/57]
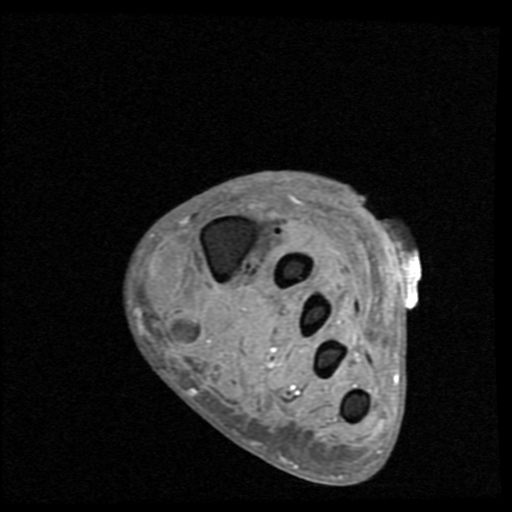
[im 49/57]
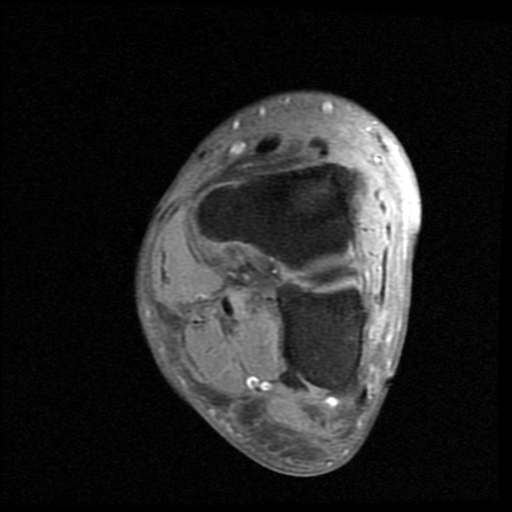

[Series 11: T2 fat-sat · coronal · 3.0mm · 0.55mm/px · 8 of 57 slices shown (1 of 2)]
[im 1/57]
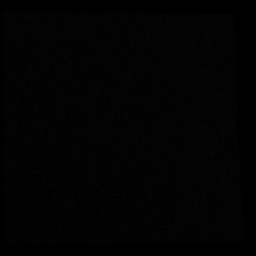
[im 9/57]
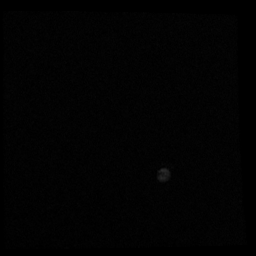
[im 17/57]
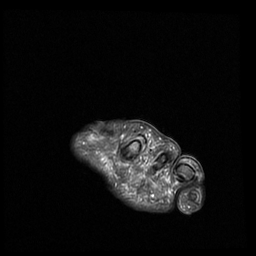
[im 25/57]
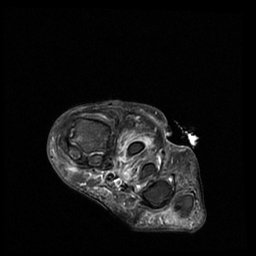
[im 33/57]
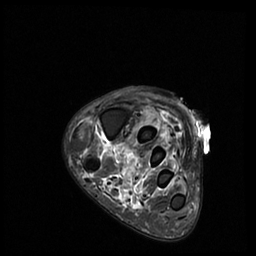
[im 41/57]
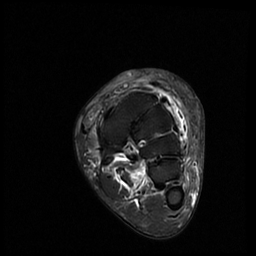
[im 49/57]
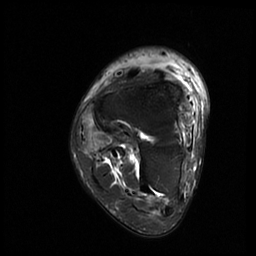
[im 57/57]
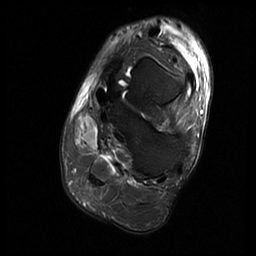

[Series 14: T2 fat-sat · oblique · 3.0mm · 0.29mm/px · 4 of 27 slices shown (2 of 2)]
[im 1/27]
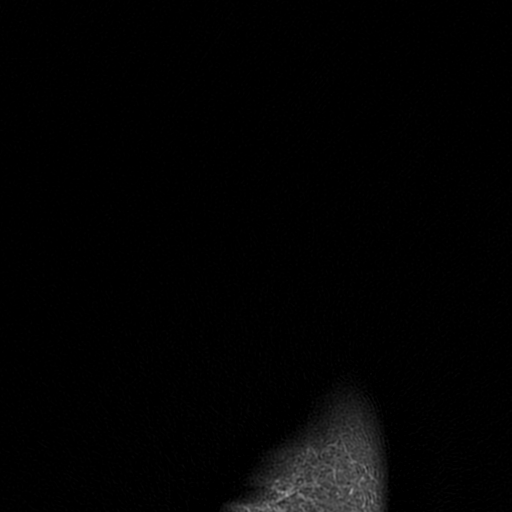
[im 9/27]
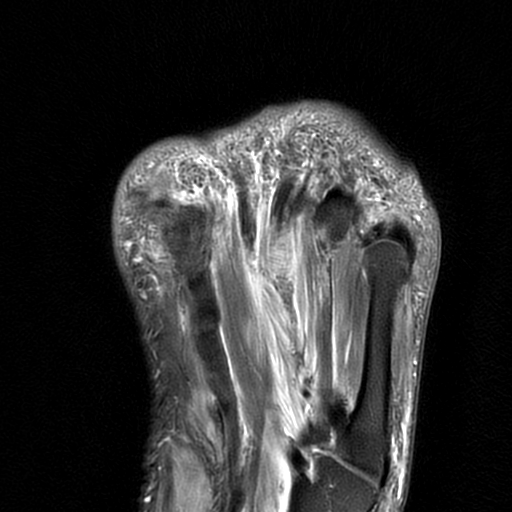
[im 18/27]
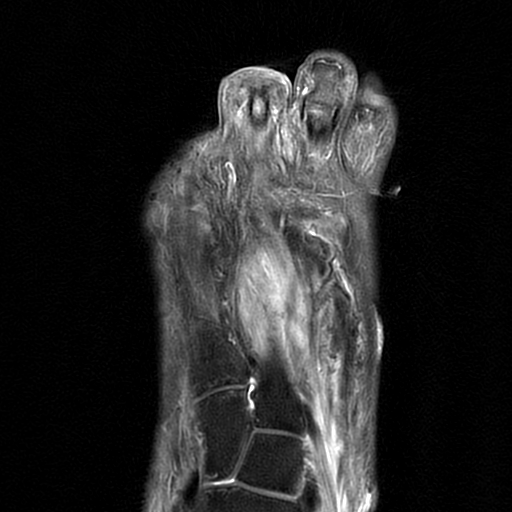
[im 27/27]
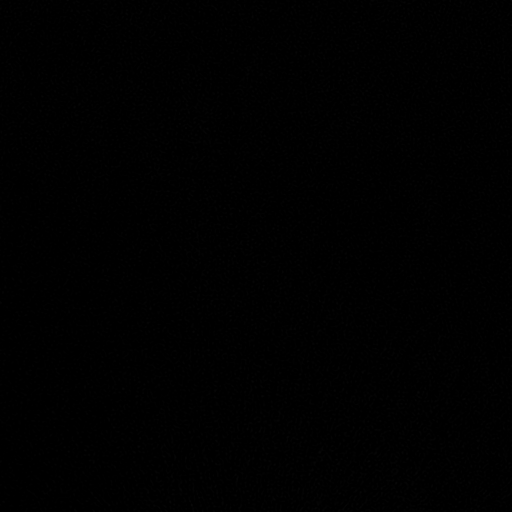

[23 of 40 positions shown; findings below may reference images not displayed]

FINDINGS: TENDONS

Peroneal: Peroneal longus tendon intact. Peroneal brevis intact.

Posteromedial: Posterior tibial tendon intact. Flexor hallucis
longus tendon intact. Flexor digitorum longus tendon intact.

Anterior: Tibialis anterior tendon intact. Extensor hallucis longus
tendon intact Extensor digitorum longus tendon intact.

Achilles:  Intact.

Plantar Fascia: Intact.

LIGAMENTS

Lateral: Thickening of the anterior and posterior talofibular
ligaments likely secondary to prior injury without disruption.
Anterior and posterior tibiofibular ligaments intact.

Medial: Deltoid ligament intact. Spring ligament intact.

CARTILAGE

Ankle Joint: No joint effusion. Normal ankle mortise. No chondral
defect.

Subtalar Joints/Sinus Tarsi: Normal subtalar joints. No subtalar
joint effusion. Normal sinus tarsi.

Bones: Amputation of the first phalanx. Mild subcortical reactive
marrow edema in the first metatarsal head without significant T1
marrow signal abnormality or cortical destruction suggesting
reactive marrow edema. Amputation of the second middle and distal
phalanx. Cortical irregularity of the distal aspect of the second
proximal phalanx with marrow edema which may reflect osteomyelitis
versus postsurgical changes.

No other marrow signal abnormality. No acute fracture or
dislocation.

Soft Tissue: Soft tissue wound along the dorsal aspect of the foot
with underlying mild soft tissue edema involving the dorsal aspect
of the foot extending into the ankle. Small amount of air is seen
tracking along the dorsal soft tissues. There is severe soft tissue
edema between the first and second and second and third metatarsals
concerning for infectious myositis. There is diffuse T2 signal
throughout the plantar musculature likely neurogenic.
IMPRESSION: 1. Soft tissue wound along the dorsal aspect of the foot with
underlying mild soft tissue edema involving the dorsal aspect of the
foot extending into the ankle concerning for cellulitis. Small
amount of air is seen tracking along the dorsal soft tissues. Severe
soft tissue edema between the first and second and second and third
metatarsals concerning for infectious myositis.
2. Amputation of the first phalanx. Mild subcortical reactive marrow
edema in the first metatarsal head without significant T1 marrow
signal abnormality or cortical destruction suggesting reactive
marrow edema.
3. Amputation of the second middle and distal phalanx. Cortical
irregularity of the distal aspect of the second proximal phalanx
with marrow edema which may reflect osteomyelitis versus
postsurgical changes.
4. There is diffuse T2 signal throughout the plantar musculature
likely neurogenic.

## 2017-02-21 ENCOUNTER — Other Ambulatory Visit (HOSPITAL_COMMUNITY): Payer: Self-pay | Admitting: Psychiatry

## 2017-02-21 DIAGNOSIS — F5104 Psychophysiologic insomnia: Secondary | ICD-10-CM

## 2017-02-21 MED FILL — AMLODIPINE BESYLATE 10 MG T: 10 | 30 days supply | Qty: 30 | Fill #1

## 2017-02-24 ENCOUNTER — Telehealth: Payer: Self-pay | Admitting: Family Medicine

## 2017-02-24 MED ORDER — GLUCOSE BLOOD VI STRP: ORAL_STRIP | 12 refills | Status: DC

## 2017-02-24 MED FILL — traZODone HCL 100 MG TABS: 100 | 30 days supply | Qty: 60 | Fill #0

## 2017-02-24 NOTE — Telephone Encounter (Signed)
Test strips refilled

## 2017-02-24 NOTE — Telephone Encounter (Signed)
Pt called to request a refill for  glucose blood (ACCU-CHEK AVIVA PLUS) test strip  He will see his new PCP next week and he would like some refill until he see the pcp, please follow up

## 2017-03-03 ENCOUNTER — Encounter: Payer: Self-pay | Admitting: Family Medicine

## 2017-03-03 ENCOUNTER — Ambulatory Visit (HOSPITAL_COMMUNITY): Payer: Self-pay | Admitting: Licensed Clinical Social Worker

## 2017-03-03 ENCOUNTER — Ambulatory Visit: Payer: Self-pay | Attending: Family Medicine | Admitting: Family Medicine

## 2017-03-03 VITALS — BP 140/90 | HR 84 | Temp 97.5°F | Ht 67.5 in | Wt 194.4 lb

## 2017-03-03 DIAGNOSIS — Z7982 Long term (current) use of aspirin: Secondary | ICD-10-CM | POA: Insufficient documentation

## 2017-03-03 DIAGNOSIS — F418 Other specified anxiety disorders: Secondary | ICD-10-CM | POA: Insufficient documentation

## 2017-03-03 DIAGNOSIS — K219 Gastro-esophageal reflux disease without esophagitis: Secondary | ICD-10-CM | POA: Insufficient documentation

## 2017-03-03 DIAGNOSIS — E1142 Type 2 diabetes mellitus with diabetic polyneuropathy: Secondary | ICD-10-CM | POA: Insufficient documentation

## 2017-03-03 DIAGNOSIS — Z794 Long term (current) use of insulin: Secondary | ICD-10-CM | POA: Insufficient documentation

## 2017-03-03 DIAGNOSIS — E118 Type 2 diabetes mellitus with unspecified complications: Secondary | ICD-10-CM

## 2017-03-03 DIAGNOSIS — E0842 Diabetes mellitus due to underlying condition with diabetic polyneuropathy: Secondary | ICD-10-CM

## 2017-03-03 DIAGNOSIS — F319 Bipolar disorder, unspecified: Secondary | ICD-10-CM | POA: Insufficient documentation

## 2017-03-03 DIAGNOSIS — H409 Unspecified glaucoma: Secondary | ICD-10-CM | POA: Insufficient documentation

## 2017-03-03 DIAGNOSIS — G4700 Insomnia, unspecified: Secondary | ICD-10-CM | POA: Insufficient documentation

## 2017-03-03 DIAGNOSIS — Z8673 Personal history of transient ischemic attack (TIA), and cerebral infarction without residual deficits: Secondary | ICD-10-CM | POA: Insufficient documentation

## 2017-03-03 DIAGNOSIS — IMO0002 Reserved for concepts with insufficient information to code with codable children: Secondary | ICD-10-CM

## 2017-03-03 DIAGNOSIS — I5032 Chronic diastolic (congestive) heart failure: Secondary | ICD-10-CM | POA: Insufficient documentation

## 2017-03-03 DIAGNOSIS — E785 Hyperlipidemia, unspecified: Secondary | ICD-10-CM | POA: Insufficient documentation

## 2017-03-03 DIAGNOSIS — I10 Essential (primary) hypertension: Secondary | ICD-10-CM

## 2017-03-03 DIAGNOSIS — I110 Hypertensive heart disease with heart failure: Secondary | ICD-10-CM | POA: Insufficient documentation

## 2017-03-03 DIAGNOSIS — E1165 Type 2 diabetes mellitus with hyperglycemia: Secondary | ICD-10-CM | POA: Insufficient documentation

## 2017-03-03 DIAGNOSIS — E559 Vitamin D deficiency, unspecified: Secondary | ICD-10-CM | POA: Insufficient documentation

## 2017-03-03 LAB — POCT URINALYSIS DIPSTICK
BILIRUBIN UA: NEGATIVE
Glucose, UA: 500
Ketones, UA: NEGATIVE
LEUKOCYTES UA: NEGATIVE
NITRITE UA: NEGATIVE
PH UA: 5.5 (ref 5.0–8.0)
Protein, UA: >=300
Spec Grav, UA: 1.01 (ref 1.010–1.025)
UROBILINOGEN UA: 0.2 U/dL

## 2017-03-03 LAB — GLUCOSE, POCT (MANUAL RESULT ENTRY)
POC GLUCOSE: 472 mg/dL — AB (ref 70–99)
POC Glucose: 446 mg/dl — AB (ref 70–99)

## 2017-03-03 MED ORDER — GABAPENTIN 300 MG PO CAPS: 300.0000 mg | ORAL_CAPSULE | Freq: Every day | ORAL | 5 refills | Status: DC

## 2017-03-03 MED ORDER — ATORVASTATIN CALCIUM 80 MG PO TABS: 80.0000 mg | ORAL_TABLET | Freq: Every morning | ORAL | 5 refills | Status: DC

## 2017-03-03 MED ORDER — INSULIN ASPART 100 UNIT/ML ~~LOC~~ SOLN
30.0000 [IU] | Freq: Once | SUBCUTANEOUS | Status: AC
  Administered 2017-03-03: 30 [IU] via SUBCUTANEOUS

## 2017-03-03 MED ORDER — GLIPIZIDE ER 10 MG PO TB24: 20.0000 mg | ORAL_TABLET | Freq: Every day | ORAL | 5 refills | Status: DC

## 2017-03-03 MED ORDER — INSULIN DETEMIR 100 UNIT/ML FLEXPEN: 20.0000 [IU] | PEN_INJECTOR | Freq: Every day | SUBCUTANEOUS | 5 refills | Status: DC

## 2017-03-03 MED ORDER — CARVEDILOL 12.5 MG PO TABS: ORAL_TABLET | ORAL | 5 refills | Status: DC

## 2017-03-03 MED ORDER — LISINOPRIL 40 MG PO TABS: 40.0000 mg | ORAL_TABLET | Freq: Every day | ORAL | 5 refills | Status: DC

## 2017-03-03 NOTE — Patient Instructions (Signed)
Diabetes Mellitus and Food It is important for you to manage your blood sugar (glucose) level. Your blood glucose level can be greatly affected by what you eat. Eating healthier foods in the appropriate amounts throughout the day at about the same time each day will help you control your blood glucose level. It can also help slow or prevent worsening of your diabetes mellitus. Healthy eating may even help you improve the level of your blood pressure and reach or maintain a healthy weight. General recommendations for healthful eating and cooking habits include:  Eating meals and snacks regularly. Avoid going long periods of time without eating to lose weight.  Eating a diet that consists mainly of plant-based foods, such as fruits, vegetables, nuts, legumes, and whole grains.  Using low-heat cooking methods, such as baking, instead of high-heat cooking methods, such as deep frying.  Work with your dietitian to make sure you understand how to use the Nutrition Facts information on food labels. How can food affect me? Carbohydrates Carbohydrates affect your blood glucose level more than any other type of food. Your dietitian will help you determine how many carbohydrates to eat at each meal and teach you how to count carbohydrates. Counting carbohydrates is important to keep your blood glucose at a healthy level, especially if you are using insulin or taking certain medicines for diabetes mellitus. Alcohol Alcohol can cause sudden decreases in blood glucose (hypoglycemia), especially if you use insulin or take certain medicines for diabetes mellitus. Hypoglycemia can be a life-threatening condition. Symptoms of hypoglycemia (sleepiness, dizziness, and disorientation) are similar to symptoms of having too much alcohol. If your health care provider has given you approval to drink alcohol, do so in moderation and use the following guidelines:  Women should not have more than one drink per day, and men  should not have more than two drinks per day. One drink is equal to: ? 12 oz of beer. ? 5 oz of wine. ? 1 oz of hard liquor.  Do not drink on an empty stomach.  Keep yourself hydrated. Have water, diet soda, or unsweetened iced tea.  Regular soda, juice, and other mixers might contain a lot of carbohydrates and should be counted.  What foods are not recommended? As you make food choices, it is important to remember that all foods are not the same. Some foods have fewer nutrients per serving than other foods, even though they might have the same number of calories or carbohydrates. It is difficult to get your body what it needs when you eat foods with fewer nutrients. Examples of foods that you should avoid that are high in calories and carbohydrates but low in nutrients include:  Trans fats (most processed foods list trans fats on the Nutrition Facts label).  Regular soda.  Juice.  Candy.  Sweets, such as cake, pie, doughnuts, and cookies.  Fried foods.  What foods can I eat? Eat nutrient-rich foods, which will nourish your body and keep you healthy. The food you should eat also will depend on several factors, including:  The calories you need.  The medicines you take.  Your weight.  Your blood glucose level.  Your blood pressure level.  Your cholesterol level.  You should eat a variety of foods, including:  Protein. ? Lean cuts of meat. ? Proteins low in saturated fats, such as fish, egg whites, and beans. Avoid processed meats.  Fruits and vegetables. ? Fruits and vegetables that may help control blood glucose levels, such as apples,   mangoes, and yams.  Dairy products. ? Choose fat-free or low-fat dairy products, such as milk, yogurt, and cheese.  Grains, bread, pasta, and rice. ? Choose whole grain products, such as multigrain bread, whole oats, and brown rice. These foods may help control blood pressure.  Fats. ? Foods containing healthful fats, such as  nuts, avocado, olive oil, canola oil, and fish.  Does everyone with diabetes mellitus have the same meal plan? Because every person with diabetes mellitus is different, there is not one meal plan that works for everyone. It is very important that you meet with a dietitian who will help you create a meal plan that is just right for you. This information is not intended to replace advice given to you by your health care provider. Make sure you discuss any questions you have with your health care provider. Document Released: 02/28/2005 Document Revised: 11/09/2015 Document Reviewed: 04/30/2013 Elsevier Interactive Patient Education  2017 Elsevier Inc.  

## 2017-03-03 NOTE — Progress Notes (Signed)
Subjective:  Patient ID: Shane [name], male    DOB: 25-May-1971  Age: 46 y.o. MRN: [id number]  CC: Diabetes and Hypertension   HPI Shane [name] is a 46 year old male with a history of type 2 diabetes mellitus (A1c 9.4), hypertension, diabetic neuropathy, depression and anxiety who presents today to establish care with me. He was previously followed by Dr. Adrian Blackwater.  He discontinued metformin due to GI side effects and reports resolution of symptoms ever since he did but has been compliant with his Glipizide and Levemir 15 units at bedtime and his blood sugar is 472 in the clinic today and 30 units of NovoLog has been administered.  He does have visual complaints and has been seen by ophthalmology's (his last visit was in 09/2016); referred to a retinal specialist but he will need medical coverage for this visit which he does not have at this time (he hopes by 04/2017 he will have medical coverage). His diabetic neuropathy is controlled on gabapentin but he complains of somnolence with it.  His anxiety and depression are managed by his psychiatrist; he denies suicidal ideation or intent at this time and his symptoms are stable.  Past Medical History:  Diagnosis Date  . Anemia   . Bipolar disorder ([place])   . Cellulitis and abscess of left leg 06/2016  . Chest pain    a. 2015 Reportedly normal stress test in [place].  Marland Kitchen Chronic diastolic CHF (congestive heart failure) ([facility])    a.03/2015 Echo: EF 55-60%, Gr 1 DD, mild MR, triv PR.  . Depression   . Depression with anxiety   . Dyspnea    with exertion  . GERD (gastroesophageal reflux disease)   . Glaucoma   . Hyperlipidemia   . Hypertension    a. 08/2014 Admitted with hypertensive urgency.  . Insomnia   . Internal carotid artery stenosis   . Lower GI bleed 07/04/2015  . Neuropathy   . Refusal of blood transfusions as patient is Jehovah's Witness   . TIA (transient ischemic attack) 08/2014; 03/2015   a. 08/2014 in setting of hypertensive  urgency.  . Type II diabetes mellitus ([place])    started insulin spring 2016, Type II  . Vitamin D deficiency spring 2016    Past Surgical History:  Procedure Laterality Date  . AMPUTATION Left 08/03/2015   Procedure: AMPUTATION LEFT GREAT TOE;  Surgeon: Newt Minion, MD;  Location: [facility];  Service: Orthopedics;  Laterality: Left;  . AMPUTATION Left 12/02/2015   Procedure: amputation of left 2nd digit  . AMPUTATION Left 04/10/2016   Procedure: Left 2nd Toe Amputation at MTP Joint;  Surgeon: Newt Minion, MD;  Location: [facility];  Service: Orthopedics;  Laterality: Left;  . AMPUTATION Left 06/09/2016   Procedure: AMPUTATION LEFT THIRD TOE;  Surgeon: [name], MD;  Location: [facility];  Service: Orthopedics;  Laterality: Left;  . AMPUTATION Left 06/26/2016   Procedure: Left Foot Transmetatarsal Amputation;  Surgeon: Newt Minion, MD;  Location: [facility];  Service: Orthopedics;  Laterality: Left;  . APPLICATION OF WOUND VAC Left 12/19/2015   Procedure: APPLICATION OF WOUND VAC;  Surgeon: Meredith Pel, MD;  Location: [facility];  Service: Orthopedics;  Laterality: Left;  . CIRCUMCISION    . COLONOSCOPY N/A 01/22/2016   Procedure: COLONOSCOPY;  Surgeon: Gatha Mayer, MD;  Location: [facility];  Service: Endoscopy;  Laterality: N/A;  . ESOPHAGOGASTRODUODENOSCOPY N/A 01/19/2016   Procedure: ESOPHAGOGASTRODUODENOSCOPY (EGD);  Surgeon: Manus Gunning, MD;  Location:  [facility];  Service: Gastroenterology;  Laterality: N/A;  . ESOPHAGOGASTRODUODENOSCOPY N/A 01/22/2016   Procedure: ESOPHAGOGASTRODUODENOSCOPY (EGD);  Surgeon: Gatha Mayer, MD;  [facility];  Service: Endoscopy;  Laterality: N/A;  . I&D EXTREMITY Left 12/02/2015   Procedure: IRRIGATION AND DEBRIDEMENT OF FOOT; LEFT SECOND TOE AMPUTATION;  Surgeon: Meredith Pel, MD;  Location: [facility];  Service: Orthopedics;  Laterality: Left;  . I&D EXTREMITY Left 12/19/2015   Procedure: I & D LEFT FOOT WITH BEADS ;  Surgeon: Meredith Pel, MD;  Location: [facility];  Service: Orthopedics;  Laterality: Left;  . I&D EXTREMITY Right 01/17/2016   Procedure: IRRIGATION AND DEBRIDEMENT RIGHT FOOT;  Surgeon: Newt Minion, MD;  Location: [facility];  Service: Orthopedics;  Laterality: Right;  . INCISION AND DRAINAGE FOOT Right 01/17/2016  . INGUINAL HERNIA REPAIR Bilateral ~ 1983- ~ 1986  . TONSILLECTOMY  ~ 1985    Allergies  Allergen Reactions  . Lactose Intolerance (Gi) Diarrhea and Other (See Comments)    Bloating (also)  . Other Other (See Comments)    Red meat causes stomach pains, bloating and diarrhea  . Milk-Related Compounds Diarrhea and Other (See Comments)    Any dairy products  - diarrhea and bloating     Outpatient Medications Prior to Visit  Medication Sig Dispense Refill  . ACCU-CHEK SOFTCLIX LANCETS lancets Use as instructed 100 each 12  . amLODipine (NORVASC) 10 MG tablet Take 1 tablet (10 mg total) by mouth daily. 30 tablet 11  . ARIPiprazole (ABILIFY) 2 MG tablet Take 2 tablets (4 mg total) by mouth daily. 90 tablet 1  . aspirin EC 81 MG tablet Take 1 tablet (81 mg total) by mouth daily. 30 tablet 11  . Blood Glucose Monitoring Suppl (ACCU-CHEK AVIVA PLUS) w/Device KIT Use as directed 1 kit 0  . Elastic Bandages & Supports (T.E.D. KNEE LENGTH/S-REGULAR) MISC Wear when awake 1 each 0  . FLUoxetine (PROZAC) 40 MG capsule Take 1 capsule (40 mg total) by mouth daily. 90 capsule 1  . fluticasone (FLONASE) 50 MCG/ACT nasal spray Place 2 sprays into both nostrils daily. 16 g 0  . folic acid (FOLVITE) 1 MG tablet Take 1 tablet (1 mg total) by mouth daily. 30 tablet 3  . glucose blood (ACCU-CHEK AVIVA PLUS) test strip Use as instructed 100 each 12  . ibuprofen (ADVIL,MOTRIN) 800 MG tablet TAKE 1 TABLET BY MOUTH EVERY 8 HOURS AS NEEDED. (Patient taking differently: TAKE 1 TABLET BY MOUTH EVERY 8 HOURS AS NEEDED FOR PAIN) 30 tablet 0  . Insulin Pen Needle (B-D ULTRAFINE III SHORT PEN) 31G X 8 MM MISC 1 application by  Does not apply route 3 (three) times daily. 90 each 11  . Lancet Devices (ACCU-CHEK SOFTCLIX) lancets Use as instructed 1 each 0  . ondansetron (ZOFRAN ODT) 8 MG disintegrating tablet Take 1 tablet (8 mg total) by mouth every 8 (eight) hours as needed for nausea or vomiting. 30 tablet 0  . pantoprazole (PROTONIX) 40 MG tablet Take 1 tablet (40 mg total) by mouth 2 (two) times daily before a meal. 60 tablet 3  . polyethylene glycol (MIRALAX / GLYCOLAX) packet Take 17 g by mouth daily as needed for moderate constipation. 14 each 0  . senna-docusate (SENOKOT-S) 8.6-50 MG tablet Take 1 tablet by mouth at bedtime. (Patient taking differently: Take 1 tablet by mouth at bedtime as needed for mild constipation. ) 60 tablet 2  . sucralfate (CARAFATE) 1 g tablet Take 1  tablet (1 g total) by mouth 4 (four) times daily -  with meals and at bedtime. 120 tablet 0  . traZODone (DESYREL) 100 MG tablet TAKE 2 TABLETS (200 MG TOTAL) BY MOUTH AT BEDTIME. 90 tablet 1  . traZODone (DESYREL) 100 MG tablet TAKE 2 TABLETS (200 MG TOTAL) BY MOUTH AT BEDTIME. 90 tablet 1  . Vitamin D, Ergocalciferol, (DRISDOL) 50000 units CAPS capsule Take 1 capsule (50,000 Units total) by mouth every 30 (thirty) days. Every 30 days 12 capsule 1  . atorvastatin (LIPITOR) 80 MG tablet TAKE 1 TABLET BY MOUTH EVERY MORNING. 30 tablet 2  . carvedilol (COREG) 12.5 MG tablet TAKE 1 TABLET BY MOUTH 2 TIMES DAILY WITH A MEAL 60 tablet 3  . gabapentin (NEURONTIN) 100 MG capsule TAKE 1 CAPSULE BY MOUTH 3 TIMES DAILY. 90 capsule 3  . glipiZIDE (GLUCOTROL XL) 10 MG 24 hr tablet Take 2 tablets (20 mg total) by mouth daily with breakfast. 60 tablet 5  . Insulin Detemir (LEVEMIR FLEXPEN) 100 UNIT/ML Pen Inject 15 Units into the skin daily at 10 pm. 6 mL 3  . lisinopril (PRINIVIL,ZESTRIL) 40 MG tablet TAKE 1 TABLET BY MOUTH DAILY. 30 tablet 2  . ranitidine (ZANTAC) 300 MG tablet Take 1 tablet (300 mg total) by mouth at bedtime. 30 tablet 5  . furosemide  (LASIX) 40 MG tablet TAKE 1 TABLET BY MOUTH EVERY OTHER DAY. (Patient not taking: Reported on 03/03/2017) 30 tablet 1  . metFORMIN (GLUCOPHAGE) 1000 MG tablet TAKE 1 TABLET BY MOUTH 2 TIMES DAILY WITH A MEAL. (Patient not taking: Reported on 03/03/2017) 60 tablet 2   No facility-administered medications prior to visit.     ROS Review of Systems  Constitutional: Negative for activity change and appetite change.  HENT: Negative for sinus pressure and sore throat.   Eyes: Positive for visual disturbance.  Respiratory: Negative for cough, chest tightness and shortness of breath.   Cardiovascular: Negative for chest pain and leg swelling.  Gastrointestinal: Negative for abdominal distention, abdominal pain, constipation and diarrhea.  Endocrine: Negative.   Genitourinary: Negative for dysuria.  Musculoskeletal: Negative for joint swelling and myalgias.  Skin: Negative for rash.  Allergic/Immunologic: Negative.   Neurological: Positive for numbness. Negative for weakness and light-headedness.  Psychiatric/Behavioral: Negative for dysphoric mood and suicidal ideas.    Objective:  BP 140/90   Pulse 84   Temp (!) 97.5 F (36.4 C) (Oral)   Ht 5' 7.5" (1.715 m)   Wt 194 lb 6.4 oz (88.2 kg)   SpO2 100%   BMI 30.00 kg/m   BP/Weight 03/03/2017 02/14/2017 3/71/6967  Systolic BP 893 810 175  Diastolic BP 90 96 86  Wt. (Lbs) 194.4 192.13 196  BMI 30 29.65 29.8  Some encounter information is confidential and restricted. Go to Review Flowsheets activity to see all data.      Physical Exam  Constitutional: He is oriented to person, place, and time. He appears well-developed and well-nourished.  Cardiovascular: Normal rate, normal heart sounds and intact distal pulses.   No murmur heard. Pulmonary/Chest: Effort normal and breath sounds normal. He has no wheezes. He has no rales. He exhibits no tenderness.  Abdominal: Soft. Bowel sounds are normal. He exhibits no distension and no mass. There  is no tenderness.  Musculoskeletal: Normal range of motion.  Neurological: He is alert and oriented to person, place, and time.  Skin: Skin is warm and dry.  Psychiatric: He has a normal mood and affect.  Lab Results  Component Value Date   HGBA1C 9.4 01/20/2017    CMP Latest Ref Rng & Units 01/29/2017 09/20/2016 09/01/2016  Glucose 65 - 99 mg/dL 165(H) 144(H) 243(H)  BUN 6 - 20 mg/dL 17 23(H) 22(H)  Creatinine 0.61 - 1.24 mg/dL 1.29(H) 1.24 1.54(H)  Sodium 135 - 145 mmol/L 139 139 137  Potassium 3.5 - 5.1 mmol/L 4.3 4.4 4.5  Chloride 101 - 111 mmol/L 104 103 99(L)  CO2 22 - 32 mmol/L _0 Calcium 8.9 - 10.3 mg/dL 9.1 8.9 8.8(L)  Total Protein 6.5 - 8.1 g/dL 6.6 6.7 -  Total Bilirubin 0.3 - 1.2 mg/dL 0.4 0.5 -  Alkaline Phos 38 - 126 U/L 52 47 -  AST 15 - 41 U/L 17 20 -  ALT 17 - 63 U/L 16(L) 15(L) -    Lipid Panel     Component Value Date/Time   CHOL 101 03/09/2016 0127   TRIG 79 03/09/2016 0127   HDL 31 (L) 03/09/2016 0127   CHOLHDL 3.3 03/09/2016 0127   VLDL 16 03/09/2016 0127   LDLCALC 54 03/09/2016 0127    Assessment & Plan:   1. Uncontrolled type 2 diabetes mellitus with complication, with long-term current use of insulin ([place]) Uncontrolled Diabetes Mellitus with A1c of 9.4 Increase Levemir to 20 units twice daily (advised on down titrating by 2 units twice daily in the event of hypoglycemia) Adhere to a diabetic diet. Advised to keep blood sugar log fasting, 2 hours post lunch and bedtime which will be reviewed at the next office visit. CBG of 472 in the clinic-NovoLog 30 units administered and patient observed for 45 minutes, CBG repeated prior to discharge Keep blood sugar logs with fasting goals of 80-120 mg/dl, random of less than 180 and in the event of sugars less than 60 mg/dl or greater than 400 mg/dl please notify the clinic ASAP. It is recommended that you undergo annual eye exams and annual foot exams. Pneumovax is recommended every 5 years  before the age of 61 and once for a lifetime at or after the age of 91. - POCT glucose (manual entry) - POCT urinalysis dipstick - insulin aspart (novoLOG) injection 30 Units; Inject 0.3 mLs (30 Units total) into the skin once. - glipiZIDE (GLUCOTROL XL) 10 MG 24 hr tablet; Take 2 tablets (20 mg total) by mouth daily with breakfast.  Dispense: 60 tablet; Refill: 5 - Insulin Detemir (LEVEMIR FLEXPEN) 100 UNIT/ML Pen; Inject 20 Units into the skin daily at 10 pm.  Dispense: 6 pen; Refill: 5  2. Essential hypertension Slightly elevated above goal of less than 130/80 Low-sodium diet - lisinopril (PRINIVIL,ZESTRIL) 40 MG tablet; Take 1 tablet (40 mg total) by mouth daily.  Dispense: 30 tablet; Refill: 5  3. Diabetic polyneuropathy associated with diabetes mellitus due to underlying condition ([place]) Controlled We'll combine multiple daily dosing to one bedtime dose due to somnolence - gabapentin (NEURONTIN) 300 MG capsule; Take 1 capsule (300 mg total) by mouth at bedtime.  Dispense: 30 capsule; Refill: 5  4. Depression with anxiety Controlled on trazodone, Abilify Management as per psych   Meds ordered this encounter  Medications  . insulin aspart (novoLOG) injection 30 Units  . lisinopril (PRINIVIL,ZESTRIL) 40 MG tablet    Sig: Take 1 tablet (40 mg total) by mouth daily.    Dispense:  30 tablet    Refill:  5  . carvedilol (COREG) 12.5 MG tablet    Sig: TAKE 1 TABLET BY MOUTH 2  TIMES DAILY WITH A MEAL    Dispense:  60 tablet    Refill:  5  . atorvastatin (LIPITOR) 80 MG tablet    Sig: Take 1 tablet (80 mg total) by mouth every morning.    Dispense:  30 tablet    Refill:  5  . glipiZIDE (GLUCOTROL XL) 10 MG 24 hr tablet    Sig: Take 2 tablets (20 mg total) by mouth daily with breakfast.    Dispense:  60 tablet    Refill:  5  . Insulin Detemir (LEVEMIR FLEXPEN) 100 UNIT/ML Pen    Sig: Inject 20 Units into the skin daily at 10 pm.    Dispense:  6 pen    Refill:  5    Discontinue  previous dose  . gabapentin (NEURONTIN) 300 MG capsule    Sig: Take 1 capsule (300 mg total) by mouth at bedtime.    Dispense:  30 capsule    Refill:  5    Follow-up: No Follow-up on file.   Arnoldo Morale MD

## 2017-03-04 MED FILL — ?CARVEDILOL 12.5 MG TABLET: 12.5 | 30 days supply | Qty: 60 | Fill #0

## 2017-03-04 MED FILL — GABAPENTIN 300 MG CAPSULE: 300 | 30 days supply | Qty: 30 | Fill #0

## 2017-03-04 MED FILL — ATORVASTATIN 80 MG TABLET: 80 | 30 days supply | Qty: 30 | Fill #0

## 2017-03-04 MED FILL — LISINOPRIL 40 MG TABLET: 40 | 30 days supply | Qty: 30 | Fill #0

## 2017-03-04 MED FILL — glipiZIDE XL 10 MG TB24: 10 | 30 days supply | Qty: 60 | Fill #0

## 2017-03-04 MED FILL — !LEVEMIR FLEXPEN 100UNITS/M: 100U/ML (3) | 30 days supply | Qty: 6 | Fill #0

## 2017-03-11 ENCOUNTER — Telehealth: Payer: Self-pay | Admitting: Family Medicine

## 2017-03-11 DIAGNOSIS — Z794 Long term (current) use of insulin: Principal | ICD-10-CM

## 2017-03-11 DIAGNOSIS — IMO0002 Reserved for concepts with insufficient information to code with codable children: Secondary | ICD-10-CM

## 2017-03-11 DIAGNOSIS — E1165 Type 2 diabetes mellitus with hyperglycemia: Secondary | ICD-10-CM

## 2017-03-11 DIAGNOSIS — E118 Type 2 diabetes mellitus with unspecified complications: Principal | ICD-10-CM

## 2017-03-11 MED ORDER — INSULIN DETEMIR 100 UNIT/ML FLEXPEN: 20.0000 [IU] | PEN_INJECTOR | Freq: Two times a day (BID) | SUBCUTANEOUS | 5 refills | Status: DC

## 2017-03-11 NOTE — Telephone Encounter (Signed)
I called but was unable to reach him. Increase Levemir to 20 units twice daily.

## 2017-03-11 NOTE — Telephone Encounter (Signed)
Patient called the office asking to speak with nurse regarding his sugar levels. Sugar levels has been from 300-500. Patient stopped taking metformin as per PCP's request and started the injections. Patient is concerned.

## 2017-03-12 MED ORDER — INSULIN DETEMIR 100 UNIT/ML FLEXPEN: 30.0000 [IU] | PEN_INJECTOR | Freq: Two times a day (BID) | SUBCUTANEOUS | 5 refills | Status: DC

## 2017-03-12 NOTE — Telephone Encounter (Signed)
I spoke with the patient on the phone and advised him to increase his Levemir to 30 units twice daily

## 2017-03-12 NOTE — Telephone Encounter (Signed)
Pt was called and informed to increase Levemir to 20 units BID. Pt states that he has been doing that and blood sugars are still running high.

## 2017-03-14 ENCOUNTER — Ambulatory Visit: Payer: Self-pay | Admitting: Physician Assistant

## 2017-03-17 ENCOUNTER — Other Ambulatory Visit: Payer: Self-pay

## 2017-03-17 ENCOUNTER — Ambulatory Visit (INDEPENDENT_AMBULATORY_CARE_PROVIDER_SITE_OTHER): Payer: No Typology Code available for payment source | Admitting: Licensed Clinical Social Worker

## 2017-03-17 ENCOUNTER — Encounter (HOSPITAL_COMMUNITY): Payer: Self-pay | Admitting: Licensed Clinical Social Worker

## 2017-03-17 DIAGNOSIS — F418 Other specified anxiety disorders: Secondary | ICD-10-CM

## 2017-03-17 MED ORDER — GLUCOSE BLOOD VI STRP: 1.0000 | ORAL_STRIP | Freq: Three times a day (TID) | 12 refills | Status: DC

## 2017-03-17 MED ORDER — TRUEPLUS LANCETS 28G MISC: 1.0000 | Freq: Three times a day (TID) | 11 refills | Status: DC

## 2017-03-17 MED ORDER — TRUE METRIX METER DEVI: 1.0000 | Freq: Three times a day (TID) | 0 refills | Status: DC

## 2017-03-17 MED FILL — TRUEplus LANCETS 28G MISC: 30 days supply | Qty: 100 | Fill #0

## 2017-03-17 MED FILL — FLUoxetine HCL 40 MG CAPS: 40 | 30 days supply | Qty: 30 | Fill #4

## 2017-03-17 MED FILL — AMLODIPINE BESYLATE 10 MG T: 10 | 30 days supply | Qty: 30 | Fill #2

## 2017-03-17 MED FILL — TRUE METRIX TEST STRIP: 30 days supply | Qty: 100 | Fill #0

## 2017-03-17 MED FILL — ARIPiprazole 2 MG TABS: 2 | 30 days supply | Qty: 60 | Fill #2

## 2017-03-17 MED FILL — !TRUE METRIX BLOOD GLUCOSE: 30 days supply | Qty: 1 | Fill #0

## 2017-03-17 NOTE — Progress Notes (Signed)
   THERAPIST PROGRESS NOTE  Session Time: 2:10-3pm  Participation Level: Active  Behavioral Response: CasualAlertDepressed  Type of Therapy: Individual Therapy  Treatment Goals addressed: Coping  Interventions: CBT  Summary: Shane [name] is a 46 y.o. male. Pt discussed his psychiatric symptoms and current life events. Pt reports his Abilify seems to be working as his moods have stabilized. Pt reports he and his wife have been talking more about intimacy. He moved back into the bedroom and is trying to take back control of his life. He feels his wife pushes him around. 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 Pt reports he is still lonely as he has no friends, except for the men in the church. Pt spends a lot of time talking to his mother daily. SHe is his support system. Encouraged pt to find additional people for support. Taught pt basic cbt concepts and the thought emotion connection with alternative perspectives. Pt reports he listens to music for his stressors.  Suicidal/Homicidal: Nowithout intent/plan  Therapist Response: Assessed pt's current functioning by self report and assessed progress. Assisted pt processing relationships, support system, cbt concepts, alternative perspectives.  Plan: Return again in 2 weeks. Diagnosis: Axis 1: Depression with [place], LCAS 03/17/2017

## 2017-03-21 ENCOUNTER — Ambulatory Visit (INDEPENDENT_AMBULATORY_CARE_PROVIDER_SITE_OTHER): Payer: No Typology Code available for payment source | Admitting: Physician Assistant

## 2017-03-21 ENCOUNTER — Encounter: Payer: Self-pay | Admitting: Physician Assistant

## 2017-03-21 VITALS — BP 140/84 | HR 84 | Ht 67.0 in | Wt 194.0 lb

## 2017-03-21 DIAGNOSIS — R197 Diarrhea, unspecified: Secondary | ICD-10-CM

## 2017-03-21 DIAGNOSIS — K219 Gastro-esophageal reflux disease without esophagitis: Secondary | ICD-10-CM

## 2017-03-21 DIAGNOSIS — R10816 Epigastric abdominal tenderness: Secondary | ICD-10-CM

## 2017-03-21 DIAGNOSIS — R110 Nausea: Secondary | ICD-10-CM

## 2017-03-21 NOTE — Progress Notes (Signed)
Agree with assessment and plan as outlined.  

## 2017-03-21 NOTE — Patient Instructions (Signed)
You may stop the Carafate.  Continue Protonix 40 mg  , take 1 tab twice daily for 2 weeks then go to 1 tablet in the morning before breakfast.  Follow up with Amy or Dr.Armbruster as needed.   You will need a colonoscopy at age 46 mg.

## 2017-03-21 NOTE — Progress Notes (Signed)
Subjective:    Patient ID: Shane [name], male    DOB: [birth year], 46 y.o.   MRN: [id number]  HPI Shane [name] is a pleasant 46 year old African-American male, established with Dr. Havery Moros who was last seen in the office on 02/14/2017 by Ellouise Newer PA-C. Patient has history of hypertension, congestive heart failure, GERD, adult-onset diabetes mellitus, insulin-dependent, chronic kidney disease stage III, and peripheral vascular disease status post partial amputations of the left foot. Patient was seen with complaints of epigastric pain diarrhea and nausea and vomiting. Patient had been having diarrhea for 3-4 years, along with nausea and epigastric discomfort intermittently. Patient had been on Protonix 40 mg once a day which was increased to twice a day It was felt that metformin may be causing the diarrhea and patient was asked to discuss stopping the metformin with his PCP. Previous EGD and colonoscopy both done in August 2017 were unremarkable with the exception of internal hemorrhoids. Patient comes back today stating that he feels much better. He says she did stop the metformin and all of his GI symptoms have resolved. He says his back to having normal bowel movements having any nausea or epigastric discomfort. His appetite has been good. He denies any heartburn or indigestion He does mention that he sometimes has gas bloating etc. when he drinks milk and this was further discussed. He is also asking whether he should continue Carafate and twice daily Protonix.  Review of Systems Pertinent positive and negative review of systems were noted in the above HPI section.  All other review of systems was otherwise negative.  Outpatient Encounter Prescriptions as of 03/21/2017  Medication Sig  . amLODipine (NORVASC) 10 MG tablet Take 1 tablet (10 mg total) by mouth daily.  . ARIPiprazole (ABILIFY) 2 MG tablet Take 2 tablets (4 mg total) by mouth daily.  Marland Kitchen aspirin EC 81 MG tablet Take 1 tablet (81 mg  total) by mouth daily.  Marland Kitchen atorvastatin (LIPITOR) 80 MG tablet Take 1 tablet (80 mg total) by mouth every morning.  . Blood Glucose Monitoring Suppl (TRUE METRIX METER) DEVI 1 each by Does not apply route 3 (three) times daily.  . carvedilol (COREG) 12.5 MG tablet TAKE 1 TABLET BY MOUTH 2 TIMES DAILY WITH A MEAL  . Elastic Bandages & Supports (T.E.D. KNEE LENGTH/S-REGULAR) MISC Wear when awake  . FLUoxetine (PROZAC) 40 MG capsule Take 1 capsule (40 mg total) by mouth daily.  . fluticasone (FLONASE) 50 MCG/ACT nasal spray Place 2 sprays into both nostrils daily.  . folic acid (FOLVITE) 1 MG tablet Take 1 tablet (1 mg total) by mouth daily.  Marland Kitchen gabapentin (NEURONTIN) 300 MG capsule Take 1 capsule (300 mg total) by mouth at bedtime.  Marland Kitchen glipiZIDE (GLUCOTROL XL) 10 MG 24 hr tablet Take 2 tablets (20 mg total) by mouth daily with breakfast.  . glucose blood (ACCU-CHEK AVIVA PLUS) test strip Use as instructed  . glucose blood (TRUE METRIX BLOOD GLUCOSE TEST) test strip 1 each by Other route 3 (three) times daily.  Marland Kitchen ibuprofen (ADVIL,MOTRIN) 800 MG tablet TAKE 1 TABLET BY MOUTH EVERY 8 HOURS AS NEEDED. (Patient taking differently: TAKE 1 TABLET BY MOUTH EVERY 8 HOURS AS NEEDED FOR PAIN)  . Insulin Detemir (LEVEMIR FLEXPEN) 100 UNIT/ML Pen Inject 30 Units into the skin 2 (two) times daily.  . Insulin Pen Needle (B-D ULTRAFINE III SHORT PEN) 31G X 8 MM MISC 1 application by Does not apply route 3 (three) times daily.  [employer] ([place])  lancets Use as instructed  . lisinopril (PRINIVIL,ZESTRIL) 40 MG tablet Take 1 tablet (40 mg total) by mouth daily.  . ondansetron (ZOFRAN ODT) 8 MG disintegrating tablet Take 1 tablet (8 mg total) by mouth every 8 (eight) hours as needed for nausea or vomiting.  . pantoprazole (PROTONIX) 40 MG tablet Take 1 tablet (40 mg total) by mouth 2 (two) times daily before a meal.  . polyethylene glycol (MIRALAX / GLYCOLAX) packet Take 17 g by mouth daily as needed  for moderate constipation.  . senna-docusate (SENOKOT-S) 8.6-50 MG tablet Take 1 tablet by mouth at bedtime. (Patient taking differently: Take 1 tablet by mouth at bedtime as needed for mild constipation. )  . sucralfate (CARAFATE) 1 g tablet Take 1 tablet (1 g total) by mouth 4 (four) times daily -  with meals and at bedtime.  . traZODone (DESYREL) 100 MG tablet TAKE 2 TABLETS (200 MG TOTAL) BY MOUTH AT BEDTIME.  . traZODone (DESYREL) 100 MG tablet TAKE 2 TABLETS (200 MG TOTAL) BY MOUTH AT BEDTIME.  Marland Kitchen TRUEPLUS LANCETS 28G MISC 1 each by Does not apply route 3 (three) times daily.  . Vitamin D, Ergocalciferol, (DRISDOL) 50000 units CAPS capsule Take 1 capsule (50,000 Units total) by mouth every 30 (thirty) days. Every 30 days   No facility-administered encounter medications on file as of 03/21/2017.    Allergies  Allergen Reactions  . Lactose Intolerance (Gi) Diarrhea and Other (See Comments)    Bloating (also)  . Other Other (See Comments)    Red meat causes stomach pains, bloating and diarrhea  . Milk-Related Compounds Diarrhea and Other (See Comments)    Any dairy products  - diarrhea and bloating   Patient Active Problem List   Diagnosis Date Noted  . Achilles tendon contracture, left 01/14/2017  . Gingivitis 11/14/2016  . Achilles tendon contracture, right 09/17/2016  . Pain and swelling of left lower leg 09/16/2016  . CKD (chronic kidney disease) stage 3, GFR 30-59 ml/min (HCC) 08/27/2016  . Dyslipidemia associated with type 2 diabetes mellitus ([place]) 07/22/2016  . Constipation 07/02/2016  . S/P transmetatarsal amputation of foot, left ([place]) 07/02/2016  . Dyspnea on exertion 07/02/2016  . Diabetic gastroparesis ([place]) 05/30/2016  . Buzzing in ear, right 03/26/2016  . Joint pain 03/11/2016  . Weakness 03/08/2016  . Dizziness 03/03/2016  . Chronic diastolic CHF (congestive heart failure), NYHA class 1 ([place]) 12/30/2015  . Diabetes mellitus type 2, uncontrolled, with complications  ([place]) 24/26/8341  . Depression with anxiety 12/01/2015  . Pain in the chest 12/01/2015  . Insomnia 11/21/2015  . GERD (gastroesophageal reflux disease) 08/18/2015  . Erectile dysfunction 07/04/2015  . Symptomatic anemia 04/24/2015  . Left arm weakness 04/02/2015  . Sensory disturbance 04/02/2015  . Essential hypertension 04/02/2015  . Hemispheric carotid artery syndrome   . HLD (hyperlipidemia)   . Headache    Social History   Social History  . Marital status: Married    Spouse name: N/A  . Number of children: 1  . Years of education: N/A   Occupational History  . disabled    Social History Main Topics  . Smoking status: Never Smoker  . Smokeless tobacco: Never Used  . Alcohol use No  . Drug use: No  . Sexual activity: No   Other Topics Concern  . Not on file   Social History Narrative   Lives in [place] with wife.  Active but doesn't routinely exercise.    Mr. Demasi's family history includes ADD /  ADHD in his brother; Anxiety disorder in his brother; Bipolar disorder in his brother; Dementia in his father; Depression in his father; Diabetes in his father and mother; Heart attack in his father; Heart disease in his mother; Hyperlipidemia in his mother; Hypertension in his father and mother; OCD in his brother; Sexual abuse in his brother; Stroke in his brother and father.      Objective:    Vitals:   03/21/17 0949  BP: 140/84  Pulse: 84    Physical Exam well-developed male in no acute distress, pleasant, blood pressure 140/84 pulse 84, BMI 30.3. HEENT; nontraumatic normocephalic EOMI PERRLA sclera anicteric Cardiovascular; regular rate and rhythm with S1-S2 no murmur or gallop, Pulmonary; clear bilaterally, Abdomen; soft, nontender nondistended bowel sounds are active, Neuropsych; mood and affect appropriate       Assessment & Plan:   #46 46 year old African-American male diabetic who had presented with complaints of nausea, epigastric discomfort and diarrhea of  which have resolved since stopping metformin. GERD-stable #3 congestive heart failure #4 peripheral vascular disease #5 chronic kidney disease stage III #6 hypertension #7 colon cancer screening-negative colonoscopy August 2017 #8 lactose intolerance   Plan; patient will stop Carafate Continue Protonix 40 mg by mouth twice a day for 2 more weeks then decrease back to once daily by mouth before meals breakfast. Patient advised to try Lactaid milk, or Lactaid tablets if he is going to consume lactose-containing products.  He will follow-up with Dr. Havery Moros on an as-needed basis, and we discussed screening colonoscopy at age 37.    Shane [name] S Nathanyel Defenbaugh PA-C 03/21/2017   Cc: Arnoldo Morale, MD

## 2017-03-31 ENCOUNTER — Ambulatory Visit (HOSPITAL_COMMUNITY): Payer: Self-pay | Admitting: Licensed Clinical Social Worker

## 2017-04-09 ENCOUNTER — Ambulatory Visit (HOSPITAL_COMMUNITY): Payer: No Typology Code available for payment source | Admitting: Psychiatry

## 2017-04-11 ENCOUNTER — Ambulatory Visit: Payer: Self-pay | Admitting: Family Medicine

## 2017-04-14 ENCOUNTER — Ambulatory Visit (HOSPITAL_COMMUNITY): Payer: Self-pay | Admitting: Licensed Clinical Social Worker

## 2017-04-18 MED FILL — GABAPENTIN 300 MG CAPSULE: 300 | 30 days supply | Qty: 30 | Fill #1

## 2017-04-18 MED FILL — traZODone HCL 100 MG TABS: 100 | 30 days supply | Qty: 60 | Fill #1

## 2017-04-21 ENCOUNTER — Ambulatory Visit (INDEPENDENT_AMBULATORY_CARE_PROVIDER_SITE_OTHER): Payer: No Typology Code available for payment source | Admitting: Licensed Clinical Social Worker

## 2017-04-21 ENCOUNTER — Encounter (HOSPITAL_COMMUNITY): Payer: Self-pay | Admitting: Licensed Clinical Social Worker

## 2017-04-21 DIAGNOSIS — F418 Other specified anxiety disorders: Secondary | ICD-10-CM

## 2017-04-21 MED FILL — TRUE METRIX TEST STRIP: 30 days supply | Qty: 100 | Fill #1

## 2017-04-21 MED FILL — ATORVASTATIN 80 MG TABLET: 80 | 30 days supply | Qty: 30 | Fill #1

## 2017-04-21 MED FILL — FLUoxetine HCL 40 MG CAPS: 40 | 30 days supply | Qty: 30 | Fill #5

## 2017-04-21 NOTE — Progress Notes (Signed)
   THERAPIST PROGRESS NOTE  Session Time: 2:10-3pm  Participation Level: Active  Behavioral Response: CasualAlert   Type of Therapy: Individual Therapy  Treatment Goals addressed: Coping  Interventions: CBT  Summary: Shane [name] is a 46 y.o. male. Pt discussed his psychiatric symptoms and current life events. Pt reports his Abilify seems to be working as his moods have stabilized. Pt came in a new and different person today. He and his wife has a discussed their current issues. He used the communication skills practiced and reminded her he was the "head of the household." She told him she was waiting on hm to take over that position. Used immediate feedback and radical honesty in his communication efforts and the outcome. Pt reports they are now communicating on intimacy. He has a dr appt and his wife has a dr appt to both discuss their physical intimacy issues. Pt reports he still feels guilty about his daughter in [place] who is currently living with her maternal grandparents. Used emotional reflection in discussing guilt. Pt reports they have moved to a better neighborhood, better school district and closer to his church. He feels his live is progressing more positively.     Suicidal/Homicidal: Nowithout intent/plan  Therapist Response: Assessed pt's current functioning by self report and assessed progress. Assisted pt processing relationships,guilt, alternative perspectives.  Plan: Return again in 2 weeks. Diagnosis: Axis 1: Depression with Anxiety    MACKENZIE,LISBETH S, LCAS 04/21/2017

## 2017-05-05 ENCOUNTER — Ambulatory Visit (HOSPITAL_COMMUNITY): Payer: Self-pay | Admitting: Licensed Clinical Social Worker

## 2017-05-05 MED FILL — AMLODIPINE BESYLATE 10 MG T: 10 | 30 days supply | Qty: 30 | Fill #3

## 2017-05-07 ENCOUNTER — Encounter: Payer: Self-pay | Admitting: Family Medicine

## 2017-05-07 ENCOUNTER — Ambulatory Visit: Payer: Self-pay | Attending: Family Medicine | Admitting: Family Medicine

## 2017-05-07 VITALS — BP 140/92 | HR 79 | Temp 97.5°F | Ht 67.0 in | Wt 193.0 lb

## 2017-05-07 DIAGNOSIS — Z89422 Acquired absence of other left toe(s): Secondary | ICD-10-CM | POA: Insufficient documentation

## 2017-05-07 DIAGNOSIS — Z23 Encounter for immunization: Secondary | ICD-10-CM | POA: Insufficient documentation

## 2017-05-07 DIAGNOSIS — K922 Gastrointestinal hemorrhage, unspecified: Secondary | ICD-10-CM | POA: Insufficient documentation

## 2017-05-07 DIAGNOSIS — Z791 Long term (current) use of non-steroidal anti-inflammatories (NSAID): Secondary | ICD-10-CM | POA: Insufficient documentation

## 2017-05-07 DIAGNOSIS — F319 Bipolar disorder, unspecified: Secondary | ICD-10-CM | POA: Insufficient documentation

## 2017-05-07 DIAGNOSIS — K219 Gastro-esophageal reflux disease without esophagitis: Secondary | ICD-10-CM | POA: Insufficient documentation

## 2017-05-07 DIAGNOSIS — M7989 Other specified soft tissue disorders: Secondary | ICD-10-CM | POA: Insufficient documentation

## 2017-05-07 DIAGNOSIS — Z91018 Allergy to other foods: Secondary | ICD-10-CM | POA: Insufficient documentation

## 2017-05-07 DIAGNOSIS — I110 Hypertensive heart disease with heart failure: Secondary | ICD-10-CM | POA: Insufficient documentation

## 2017-05-07 DIAGNOSIS — Z79899 Other long term (current) drug therapy: Secondary | ICD-10-CM | POA: Insufficient documentation

## 2017-05-07 DIAGNOSIS — E1142 Type 2 diabetes mellitus with diabetic polyneuropathy: Secondary | ICD-10-CM | POA: Insufficient documentation

## 2017-05-07 DIAGNOSIS — E0842 Diabetes mellitus due to underlying condition with diabetic polyneuropathy: Secondary | ICD-10-CM

## 2017-05-07 DIAGNOSIS — F418 Other specified anxiety disorders: Secondary | ICD-10-CM | POA: Insufficient documentation

## 2017-05-07 DIAGNOSIS — M79605 Pain in left leg: Secondary | ICD-10-CM | POA: Insufficient documentation

## 2017-05-07 DIAGNOSIS — E118 Type 2 diabetes mellitus with unspecified complications: Secondary | ICD-10-CM

## 2017-05-07 DIAGNOSIS — E785 Hyperlipidemia, unspecified: Secondary | ICD-10-CM | POA: Insufficient documentation

## 2017-05-07 DIAGNOSIS — IMO0002 Reserved for concepts with insufficient information to code with codable children: Secondary | ICD-10-CM

## 2017-05-07 DIAGNOSIS — E559 Vitamin D deficiency, unspecified: Secondary | ICD-10-CM | POA: Insufficient documentation

## 2017-05-07 DIAGNOSIS — I5032 Chronic diastolic (congestive) heart failure: Secondary | ICD-10-CM | POA: Insufficient documentation

## 2017-05-07 DIAGNOSIS — Z794 Long term (current) use of insulin: Secondary | ICD-10-CM | POA: Insufficient documentation

## 2017-05-07 DIAGNOSIS — Z7982 Long term (current) use of aspirin: Secondary | ICD-10-CM | POA: Insufficient documentation

## 2017-05-07 DIAGNOSIS — Z8673 Personal history of transient ischemic attack (TIA), and cerebral infarction without residual deficits: Secondary | ICD-10-CM | POA: Insufficient documentation

## 2017-05-07 DIAGNOSIS — I10 Essential (primary) hypertension: Secondary | ICD-10-CM

## 2017-05-07 DIAGNOSIS — Z91011 Allergy to milk products: Secondary | ICD-10-CM | POA: Insufficient documentation

## 2017-05-07 DIAGNOSIS — Z9889 Other specified postprocedural states: Secondary | ICD-10-CM | POA: Insufficient documentation

## 2017-05-07 DIAGNOSIS — G4700 Insomnia, unspecified: Secondary | ICD-10-CM | POA: Insufficient documentation

## 2017-05-07 DIAGNOSIS — Z89412 Acquired absence of left great toe: Secondary | ICD-10-CM | POA: Insufficient documentation

## 2017-05-07 DIAGNOSIS — E1165 Type 2 diabetes mellitus with hyperglycemia: Secondary | ICD-10-CM | POA: Insufficient documentation

## 2017-05-07 DIAGNOSIS — H409 Unspecified glaucoma: Secondary | ICD-10-CM | POA: Insufficient documentation

## 2017-05-07 LAB — GLUCOSE, POCT (MANUAL RESULT ENTRY)
POC GLUCOSE: 300 mg/dL — AB (ref 70–99)
POC Glucose: 343 mg/dl — AB (ref 70–99)

## 2017-05-07 LAB — POCT GLYCOSYLATED HEMOGLOBIN (HGB A1C): HEMOGLOBIN A1C: 11.4

## 2017-05-07 MED ORDER — INSULIN ASPART 100 UNIT/ML ~~LOC~~ SOLN
8.0000 [IU] | Freq: Once | SUBCUTANEOUS | Status: AC
  Administered 2017-05-07: 8 [IU] via SUBCUTANEOUS

## 2017-05-07 MED ORDER — GABAPENTIN 300 MG PO CAPS: 300.0000 mg | ORAL_CAPSULE | Freq: Two times a day (BID) | ORAL | 5 refills | Status: DC

## 2017-05-07 MED ORDER — INSULIN GLARGINE 100 UNIT/ML SOLOSTAR PEN
30.0000 [IU] | PEN_INJECTOR | Freq: Two times a day (BID) | SUBCUTANEOUS | 3 refills | Status: DC
Start: 2017-05-07 — End: 2017-07-04

## 2017-05-07 MED FILL — GABAPENTIN 300 MG CAPSULE: 300 | 30 days supply | Qty: 60 | Fill #0

## 2017-05-07 MED FILL — !LANTUS SOLOSTAR 100UNITS/M: 100 | 25 days supply | Qty: 15 | Fill #0

## 2017-05-07 NOTE — Patient Instructions (Signed)
Diabetes Mellitus and Food It is important for you to manage your blood sugar (glucose) level. Your blood glucose level can be greatly affected by what you eat. Eating healthier foods in the appropriate amounts throughout the day at about the same time each day will help you control your blood glucose level. It can also help slow or prevent worsening of your diabetes mellitus. Healthy eating may even help you improve the level of your blood pressure and reach or maintain a healthy weight. General recommendations for healthful eating and cooking habits include:  Eating meals and snacks regularly. Avoid going long periods of time without eating to lose weight.  Eating a diet that consists mainly of plant-based foods, such as fruits, vegetables, nuts, legumes, and whole grains.  Using low-heat cooking methods, such as baking, instead of high-heat cooking methods, such as deep frying.  Work with your dietitian to make sure you understand how to use the Nutrition Facts information on food labels. How can food affect me? Carbohydrates Carbohydrates affect your blood glucose level more than any other type of food. Your dietitian will help you determine how many carbohydrates to eat at each meal and teach you how to count carbohydrates. Counting carbohydrates is important to keep your blood glucose at a healthy level, especially if you are using insulin or taking certain medicines for diabetes mellitus. Alcohol Alcohol can cause sudden decreases in blood glucose (hypoglycemia), especially if you use insulin or take certain medicines for diabetes mellitus. Hypoglycemia can be a life-threatening condition. Symptoms of hypoglycemia (sleepiness, dizziness, and disorientation) are similar to symptoms of having too much alcohol. If your health care provider has given you approval to drink alcohol, do so in moderation and use the following guidelines:  Women should not have more than one drink per day, and men  should not have more than two drinks per day. One drink is equal to: ? 12 oz of beer. ? 5 oz of wine. ? 1 oz of hard liquor.  Do not drink on an empty stomach.  Keep yourself hydrated. Have water, diet soda, or unsweetened iced tea.  Regular soda, juice, and other mixers might contain a lot of carbohydrates and should be counted.  What foods are not recommended? As you make food choices, it is important to remember that all foods are not the same. Some foods have fewer nutrients per serving than other foods, even though they might have the same number of calories or carbohydrates. It is difficult to get your body what it needs when you eat foods with fewer nutrients. Examples of foods that you should avoid that are high in calories and carbohydrates but low in nutrients include:  Trans fats (most processed foods list trans fats on the Nutrition Facts label).  Regular soda.  Juice.  Candy.  Sweets, such as cake, pie, doughnuts, and cookies.  Fried foods.  What foods can I eat? Eat nutrient-rich foods, which will nourish your body and keep you healthy. The food you should eat also will depend on several factors, including:  The calories you need.  The medicines you take.  Your weight.  Your blood glucose level.  Your blood pressure level.  Your cholesterol level.  You should eat a variety of foods, including:  Protein. ? Lean cuts of meat. ? Proteins low in saturated fats, such as fish, egg whites, and beans. Avoid processed meats.  Fruits and vegetables. ? Fruits and vegetables that may help control blood glucose levels, such as apples,   mangoes, and yams.  Dairy products. ? Choose fat-free or low-fat dairy products, such as milk, yogurt, and cheese.  Grains, bread, pasta, and rice. ? Choose whole grain products, such as multigrain bread, whole oats, and brown rice. These foods may help control blood pressure.  Fats. ? Foods containing healthful fats, such as  nuts, avocado, olive oil, canola oil, and fish.  Does everyone with diabetes mellitus have the same meal plan? Because every person with diabetes mellitus is different, there is not one meal plan that works for everyone. It is very important that you meet with a dietitian who will help you create a meal plan that is just right for you. This information is not intended to replace advice given to you by your health care provider. Make sure you discuss any questions you have with your health care provider. Document Released: 02/28/2005 Document Revised: 11/09/2015 Document Reviewed: 04/30/2013 Elsevier Interactive Patient Education  2017 Elsevier Inc.  

## 2017-05-07 NOTE — Progress Notes (Signed)
Subjective:  Patient ID: Shane [name], male    DOB: 01-May-1971  Age: 46 y.o. MRN: [id number]  CC: Diabetes and Foot Pain   HPI Shane [name]  is a 46 year old male with a history of type 2 diabetes mellitus (A1c 11 up from 9.4), hypertension, diabetic neuropathy, depression and anxiety who presents today for a follow-up visit  Complains that his fasting blood sugars have been in the 300s and his sugars are never less than 200 despite taking his insulin.  He was prescribed Levemir 30 units twice daily but ran out of Levemir and has been taking leftover Lantus at home anywhere from 15-20 units. He complains of worsening neuropathy in his feet with burning and pain in the soles of his feet. His CBG is 343 and 8 units of NovoLog has been administered. He informs me that his Levemir is not working; endorses a previous history of shaking with Levemir but then goes on to tell me he was on Lantus previously which did not work as well and it just caused him to eat and gain weight.  He complains of left leg swelling and pain and denies presence of fever.  He described pain as a sticking sensation; he is unsure of the duration of his symptoms. Denies history of trauma. He endorses compliance with his antihypertensives and all his other medications.  Past Medical History:  Diagnosis Date  . Anemia   . Bipolar disorder ([place])   . Cellulitis and abscess of left leg 06/2016  . Chest pain    a. 2015 Reportedly normal stress test in [place].  Marland Kitchen Chronic diastolic CHF (congestive heart failure) (HCC)    a.03/2015 Echo: EF 55-60%, Gr 1 DD, mild MR, triv PR.  . Depression   . Depression with anxiety   . Dyspnea    with exertion  . GERD (gastroesophageal reflux disease)   . Glaucoma   . Hyperlipidemia   . Hypertension    a. 08/2014 Admitted with hypertensive urgency.  . Insomnia   . Internal carotid artery stenosis   . Lower GI bleed 07/04/2015  . Neuropathy   . Refusal of blood transfusions as patient  is Jehovah's Witness   . TIA (transient ischemic attack) 08/2014; 03/2015   a. 08/2014 in setting of hypertensive urgency.  . Type II diabetes mellitus ([place])    started insulin spring 2016, Type II  . Vitamin D deficiency spring 2016    Past Surgical History:  Procedure Laterality Date  . AMPUTATION Left 08/03/2015   Procedure: AMPUTATION LEFT GREAT TOE;  Surgeon: Newt Minion, MD;  Location: [facility];  Service: Orthopedics;  Laterality: Left;  . AMPUTATION Left 12/02/2015   Procedure: amputation of left 2nd digit  . AMPUTATION Left 04/10/2016   Procedure: Left 2nd Toe Amputation at MTP Joint;  Surgeon: Newt Minion, MD;  Location: [facility];  Service: Orthopedics;  Laterality: Left;  . AMPUTATION Left 06/09/2016   Procedure: AMPUTATION LEFT THIRD TOE;  Surgeon: [name], MD;  Location: [facility];  Service: Orthopedics;  Laterality: Left;  . AMPUTATION Left 06/26/2016   Procedure: Left Foot Transmetatarsal Amputation;  Surgeon: Newt Minion, MD;  Location: [facility];  Service: Orthopedics;  Laterality: Left;  . APPLICATION OF WOUND VAC Left 12/19/2015   Procedure: APPLICATION OF WOUND VAC;  Surgeon: Meredith Pel, MD;  Location: [facility];  Service: Orthopedics;  Laterality: Left;  . CIRCUMCISION    . COLONOSCOPY N/A 01/22/2016   Procedure: COLONOSCOPY;  Surgeon:  Gatha Mayer, MD;  Location: [facility];  Service: Endoscopy;  Laterality: N/A;  . ESOPHAGOGASTRODUODENOSCOPY N/A 01/19/2016   Procedure: ESOPHAGOGASTRODUODENOSCOPY (EGD);  Surgeon: Manus Gunning, MD;  Location: [facility];  Service: Gastroenterology;  Laterality: N/A;  . ESOPHAGOGASTRODUODENOSCOPY N/A 01/22/2016   Procedure: ESOPHAGOGASTRODUODENOSCOPY (EGD);  Surgeon: Gatha Mayer, MD;  [facility];  Service: Endoscopy;  Laterality: N/A;  . I&D EXTREMITY Left 12/02/2015   Procedure: IRRIGATION AND DEBRIDEMENT OF FOOT; LEFT SECOND TOE AMPUTATION;  Surgeon: Meredith Pel, MD;  Location: [facility];  Service: Orthopedics;   Laterality: Left;  . I&D EXTREMITY Left 12/19/2015   Procedure: I & D LEFT FOOT WITH BEADS ;  Surgeon: Meredith Pel, MD;  Location: [facility];  Service: Orthopedics;  Laterality: Left;  . I&D EXTREMITY Right 01/17/2016   Procedure: IRRIGATION AND DEBRIDEMENT RIGHT FOOT;  Surgeon: Newt Minion, MD;  Location: [facility];  Service: Orthopedics;  Laterality: Right;  . INCISION AND DRAINAGE FOOT Right 01/17/2016  . INGUINAL HERNIA REPAIR Bilateral ~ 1983- ~ 1986  . TONSILLECTOMY  ~ 1985    Allergies  Allergen Reactions  . Lactose Intolerance (Gi) Diarrhea and Other (See Comments)    Bloating (also)  . Other Other (See Comments)    Red meat causes stomach pains, bloating and diarrhea  . Milk-Related Compounds Diarrhea and Other (See Comments)    Any dairy products  - diarrhea and bloating     Outpatient Medications Prior to Visit  Medication Sig Dispense Refill  . amLODipine (NORVASC) 10 MG tablet Take 1 tablet (10 mg total) by mouth daily. 30 tablet 11  . ARIPiprazole (ABILIFY) 2 MG tablet Take 2 tablets (4 mg total) by mouth daily. 90 tablet 1  . aspirin EC 81 MG tablet Take 1 tablet (81 mg total) by mouth daily. 30 tablet 11  . atorvastatin (LIPITOR) 80 MG tablet Take 1 tablet (80 mg total) by mouth every morning. 30 tablet 5  . Blood Glucose Monitoring Suppl (TRUE METRIX METER) DEVI 1 each by Does not apply route 3 (three) times daily. 1 Device 0  . carvedilol (COREG) 12.5 MG tablet TAKE 1 TABLET BY MOUTH 2 TIMES DAILY WITH A MEAL 60 tablet 5  . Elastic Bandages & Supports (T.E.D. KNEE LENGTH/S-REGULAR) MISC Wear when awake 1 each 0  . FLUoxetine (PROZAC) 40 MG capsule Take 1 capsule (40 mg total) by mouth daily. 90 capsule 1  . fluticasone (FLONASE) 50 MCG/ACT nasal spray Place 2 sprays into both nostrils daily. 16 g 0  . folic acid (FOLVITE) 1 MG tablet Take 1 tablet (1 mg total) by mouth daily. 30 tablet 3  . glipiZIDE (GLUCOTROL XL) 10 MG 24 hr tablet Take 2 tablets (20 mg total) by  mouth daily with breakfast. 60 tablet 5  . glucose blood (ACCU-CHEK AVIVA PLUS) test strip Use as instructed 100 each 12  . glucose blood (TRUE METRIX BLOOD GLUCOSE TEST) test strip 1 each by Other route 3 (three) times daily. 100 each 12  . ibuprofen (ADVIL,MOTRIN) 800 MG tablet TAKE 1 TABLET BY MOUTH EVERY 8 HOURS AS NEEDED. (Patient taking differently: TAKE 1 TABLET BY MOUTH EVERY 8 HOURS AS NEEDED FOR PAIN) 30 tablet 0  . Insulin Pen Needle (B-D ULTRAFINE III SHORT PEN) 31G X 8 MM MISC 1 application by Does not apply route 3 (three) times daily. 90 each 11  . Lancet Devices (ACCU-CHEK SOFTCLIX) lancets Use as instructed 1 each 0  . lisinopril (PRINIVIL,ZESTRIL)  40 MG tablet Take 1 tablet (40 mg total) by mouth daily. 30 tablet 5  . ondansetron (ZOFRAN ODT) 8 MG disintegrating tablet Take 1 tablet (8 mg total) by mouth every 8 (eight) hours as needed for nausea or vomiting. 30 tablet 0  . pantoprazole (PROTONIX) 40 MG tablet Take 1 tablet (40 mg total) by mouth 2 (two) times daily before a meal. 60 tablet 3  . polyethylene glycol (MIRALAX / GLYCOLAX) packet Take 17 g by mouth daily as needed for moderate constipation. 14 each 0  . senna-docusate (SENOKOT-S) 8.6-50 MG tablet Take 1 tablet by mouth at bedtime. (Patient taking differently: Take 1 tablet by mouth at bedtime as needed for mild constipation. ) 60 tablet 2  . sucralfate (CARAFATE) 1 g tablet Take 1 tablet (1 g total) by mouth 4 (four) times daily -  with meals and at bedtime. 120 tablet 0  . traZODone (DESYREL) 100 MG tablet TAKE 2 TABLETS (200 MG TOTAL) BY MOUTH AT BEDTIME. 90 tablet 1  . traZODone (DESYREL) 100 MG tablet TAKE 2 TABLETS (200 MG TOTAL) BY MOUTH AT BEDTIME. 90 tablet 1  . TRUEPLUS LANCETS 28G MISC 1 each by Does not apply route 3 (three) times daily. 100 each 11  . gabapentin (NEURONTIN) 300 MG capsule Take 1 capsule (300 mg total) by mouth at bedtime. 30 capsule 5  . Insulin Detemir (LEVEMIR FLEXPEN) 100 UNIT/ML Pen  Inject 30 Units into the skin 2 (two) times daily. 6 pen 5  . Vitamin D, Ergocalciferol, (DRISDOL) 50000 units CAPS capsule Take 1 capsule (50,000 Units total) by mouth every 30 (thirty) days. Every 30 days (Patient not taking: Reported on 05/07/2017) 12 capsule 1   No facility-administered medications prior to visit.     ROS Review of Systems  Constitutional: Negative for activity change and appetite change.  HENT: Negative for sinus pressure and sore throat.   Eyes: Negative for visual disturbance.  Respiratory: Negative for cough, chest tightness and shortness of breath.   Cardiovascular: Negative for chest pain and leg swelling.  Gastrointestinal: Negative for abdominal distention, abdominal pain, constipation and diarrhea.  Endocrine: Negative.   Genitourinary: Negative for dysuria.  Musculoskeletal:       See hpi  Skin: Negative for rash.  Allergic/Immunologic: Negative.   Neurological: Negative for weakness, light-headedness and numbness.  Psychiatric/Behavioral: Negative for dysphoric mood and suicidal ideas.    Objective:  BP (!) 140/92   Pulse 79   Temp (!) 97.5 F (36.4 C) (Oral)   Ht _0  (1.702 m)   Wt 193 lb (87.5 kg)   SpO2 98%   BMI 30.23 kg/m   BP/Weight 05/07/2017 03/21/2017 5/63/8756  Systolic BP 433 295 188  Diastolic BP 92 84 90  Wt. (Lbs) 193 194 194.4  BMI 30.23 30.38 30  Some encounter information is confidential and restricted. Go to Review Flowsheets activity to see all data.      Physical Exam  Constitutional: He is oriented to person, place, and time. He appears well-developed and well-nourished.  Cardiovascular: Normal rate, normal heart sounds and intact distal pulses.  No murmur heard. Pulmonary/Chest: Effort normal and breath sounds normal. He has no wheezes. He has no rales. He exhibits no tenderness.  Abdominal: Soft. Bowel sounds are normal. He exhibits no distension and no mass. There is no tenderness.  Musculoskeletal: He  exhibits edema (2+ non pitting pedal edema, unequivocal Homan's sign in left leg) and tenderness.  Left transmetatarsal amputation  Neurological: He is  alert and oriented to person, place, and time.  Skin: Skin is warm and dry.  Psychiatric: He has a normal mood and affect.    Lab Results  Component Value Date   HGBA1C 11.4 05/07/2017     Assessment & Plan:   1. Diabetes mellitus type 2, uncontrolled, with complications ([place]) Uncontrolled with A1c of 11.4 Switch from Levemir to Lantus Patient to call the clinic back next week with his blood sugar logs and we will instruct him on titrating up his dose of Lantus Diabetic diet Consider addition of Victoza at his next visit - POCT glucose (manual entry) - POCT glycosylated hemoglobin (Hb A1C) - insulin aspart (novoLOG) injection 8 Units - POCT glucose (manual entry) - CMP14+EGFR; Future - Lipid panel; Future  2. Diabetic polyneuropathy associated with diabetes mellitus due to underlying condition (HCC) Uncontrolled Increase dose of gabapentin - gabapentin (NEURONTIN) 300 MG capsule; Take 1 capsule (300 mg total) by mouth 2 (two) times daily.  Dispense: 60 capsule; Refill: 5  3. Leg swelling We will need to exclude DVT Low suspicion for cellulitis Could also have underlying venous insufficiency - VAS [place] LOWER EXTREMITY VENOUS (DVT); Future  4. Need for influenza vaccination - Flu Vaccine QUAD 36+ mos IM  5. Essential hypertension Likely above goal of less than 130/80 Low-sodium diet Continue current regimen   Meds ordered this encounter  Medications  . Insulin Glargine (LANTUS SOLOSTAR) 100 UNIT/ML Solostar Pen    Sig: Inject 30 Units into the skin 2 (two) times daily.    Dispense:  5 pen    Refill:  3  . gabapentin (NEURONTIN) 300 MG capsule    Sig: Take 1 capsule (300 mg total) by mouth 2 (two) times daily.    Dispense:  60 capsule    Refill:  5    Discontinue previous dose  . insulin aspart (novoLOG) injection  8 Units    Follow-up: Return in about 1 month (around 06/06/2017) for Follow-up on diabetes mellitus.   Arnoldo Morale MD

## 2017-05-12 ENCOUNTER — Ambulatory Visit: Payer: Self-pay | Attending: Family Medicine

## 2017-05-12 ENCOUNTER — Ambulatory Visit (HOSPITAL_COMMUNITY)
Admission: RE | Admit: 2017-05-12 | Discharge: 2017-05-12 | Disposition: A | Discharge status: Home / Self Care | Payer: Self-pay | Source: Ambulatory Visit | Attending: Family Medicine | Admitting: Family Medicine

## 2017-05-12 DIAGNOSIS — M7989 Other specified soft tissue disorders: Secondary | ICD-10-CM | POA: Insufficient documentation

## 2017-05-12 DIAGNOSIS — E118 Type 2 diabetes mellitus with unspecified complications: Secondary | ICD-10-CM

## 2017-05-12 DIAGNOSIS — IMO0002 Reserved for concepts with insufficient information to code with codable children: Secondary | ICD-10-CM

## 2017-05-12 DIAGNOSIS — M79662 Pain in left lower leg: Secondary | ICD-10-CM | POA: Insufficient documentation

## 2017-05-12 DIAGNOSIS — E1165 Type 2 diabetes mellitus with hyperglycemia: Secondary | ICD-10-CM | POA: Insufficient documentation

## 2017-05-12 NOTE — Progress Notes (Signed)
VASCULAR LAB PRELIMINARY  PRELIMINARY  PRELIMINARY  PRELIMINARY  Left lower extremity venous duplex completed.    Preliminary report:  There is no DVT or SVT noted in the left lower extremity.   Oakley Kossman, RVT 05/12/2017, 11:44 AM

## 2017-05-12 NOTE — Progress Notes (Signed)
Patient here for lab visit  

## 2017-05-13 LAB — CMP14+EGFR
A/G RATIO: 1.3 (ref 1.2–2.2)
ALBUMIN: 4.2 g/dL (ref 3.5–5.5)
ALK PHOS: 79 IU/L (ref 39–117)
ALT: 30 IU/L (ref 0–44)
AST: 24 IU/L (ref 0–40)
BUN / CREAT RATIO: 12 (ref 9–20)
BUN: 16 mg/dL (ref 6–24)
Bilirubin Total: <0.2 mg/dL (ref 0.0–1.2)
CO2: 26 mmol/L (ref 20–29)
CREATININE: 1.35 mg/dL — AB (ref 0.76–1.27)
Calcium: 9.8 mg/dL (ref 8.7–10.2)
Chloride: 105 mmol/L (ref 96–106)
GFR calc Af Amer: 71 mL/min/{1.73_m2} (ref >59)
GFR, EST NON AFRICAN AMERICAN: 62 mL/min/{1.73_m2} (ref >59)
GLOBULIN, TOTAL: 3.2 g/dL (ref 1.5–4.5)
Glucose: 84 mg/dL (ref 65–99)
POTASSIUM: 4.7 mmol/L (ref 3.5–5.2)
SODIUM: 146 mmol/L — AB (ref 134–144)
Total Protein: 7.4 g/dL (ref 6.0–8.5)

## 2017-05-13 LAB — LIPID PANEL
CHOLESTEROL TOTAL: 137 mg/dL (ref 100–199)
Chol/HDL Ratio: 3.3 ratio (ref 0.0–5.0)
HDL: 41 mg/dL (ref >39)
LDL CALC: 79 mg/dL (ref 0–99)
TRIGLYCERIDES: 86 mg/dL (ref 0–149)
VLDL CHOLESTEROL CAL: 17 mg/dL (ref 5–40)

## 2017-05-14 ENCOUNTER — Telehealth: Payer: Self-pay | Admitting: Family Medicine

## 2017-05-14 NOTE — Telephone Encounter (Signed)
Pt called and states that blood sugars are doing better , but he has noticed a little weight gain and would like a medication to control his weight.

## 2017-05-14 NOTE — Telephone Encounter (Signed)
Pt. Called to speak with the nurse regarding his blood sugar, pt. States that his PCP told him to call if he had any problems with his DM. Please f/u

## 2017-05-15 NOTE — Telephone Encounter (Signed)
Pt was called and informed of response for weight loss medication.

## 2017-05-15 NOTE — Telephone Encounter (Signed)
Great. Medication for weight gain could be discussed at his upcoming appointment next month.

## 2017-05-18 ENCOUNTER — Emergency Department (HOSPITAL_COMMUNITY): Payer: Medicaid Other

## 2017-05-18 ENCOUNTER — Inpatient Hospital Stay (HOSPITAL_COMMUNITY)
Admission: EM | Admit: 2017-05-18 | Discharge: 2017-05-22 | DRG: 638 | Disposition: A | Discharge status: Skilled Nursing Facility | Payer: Medicaid Other | Attending: Internal Medicine | Admitting: Internal Medicine

## 2017-05-18 ENCOUNTER — Encounter (HOSPITAL_COMMUNITY): Payer: Self-pay | Admitting: *Deleted

## 2017-05-18 ENCOUNTER — Other Ambulatory Visit: Payer: Self-pay

## 2017-05-18 DIAGNOSIS — L03115 Cellulitis of right lower limb: Secondary | ICD-10-CM | POA: Diagnosis present

## 2017-05-18 DIAGNOSIS — M5116 Intervertebral disc disorders with radiculopathy, lumbar region: Secondary | ICD-10-CM | POA: Diagnosis present

## 2017-05-18 DIAGNOSIS — F319 Bipolar disorder, unspecified: Secondary | ICD-10-CM | POA: Diagnosis present

## 2017-05-18 DIAGNOSIS — F339 Major depressive disorder, recurrent, unspecified: Secondary | ICD-10-CM

## 2017-05-18 DIAGNOSIS — E1140 Type 2 diabetes mellitus with diabetic neuropathy, unspecified: Secondary | ICD-10-CM | POA: Diagnosis present

## 2017-05-18 DIAGNOSIS — L03119 Cellulitis of unspecified part of limb: Secondary | ICD-10-CM

## 2017-05-18 DIAGNOSIS — E739 Lactose intolerance, unspecified: Secondary | ICD-10-CM

## 2017-05-18 DIAGNOSIS — R29898 Other symptoms and signs involving the musculoskeletal system: Secondary | ICD-10-CM

## 2017-05-18 DIAGNOSIS — IMO0002 Reserved for concepts with insufficient information to code with codable children: Secondary | ICD-10-CM | POA: Diagnosis present

## 2017-05-18 DIAGNOSIS — G4733 Obstructive sleep apnea (adult) (pediatric): Secondary | ICD-10-CM | POA: Diagnosis present

## 2017-05-18 DIAGNOSIS — Z8249 Family history of ischemic heart disease and other diseases of the circulatory system: Secondary | ICD-10-CM

## 2017-05-18 DIAGNOSIS — D631 Anemia in chronic kidney disease: Secondary | ICD-10-CM | POA: Diagnosis present

## 2017-05-18 DIAGNOSIS — I69392 Facial weakness following cerebral infarction: Secondary | ICD-10-CM

## 2017-05-18 DIAGNOSIS — L02415 Cutaneous abscess of right lower limb: Secondary | ICD-10-CM | POA: Diagnosis present

## 2017-05-18 DIAGNOSIS — D649 Anemia, unspecified: Secondary | ICD-10-CM | POA: Diagnosis present

## 2017-05-18 DIAGNOSIS — Z818 Family history of other mental and behavioral disorders: Secondary | ICD-10-CM

## 2017-05-18 DIAGNOSIS — H409 Unspecified glaucoma: Secondary | ICD-10-CM | POA: Diagnosis present

## 2017-05-18 DIAGNOSIS — I69312 Visuospatial deficit and spatial neglect following cerebral infarction: Secondary | ICD-10-CM

## 2017-05-18 DIAGNOSIS — I6529 Occlusion and stenosis of unspecified carotid artery: Secondary | ICD-10-CM

## 2017-05-18 DIAGNOSIS — Z833 Family history of diabetes mellitus: Secondary | ICD-10-CM

## 2017-05-18 DIAGNOSIS — E1165 Type 2 diabetes mellitus with hyperglycemia: Secondary | ICD-10-CM | POA: Diagnosis present

## 2017-05-18 DIAGNOSIS — R531 Weakness: Secondary | ICD-10-CM | POA: Diagnosis present

## 2017-05-18 DIAGNOSIS — Z89412 Acquired absence of left great toe: Secondary | ICD-10-CM

## 2017-05-18 DIAGNOSIS — I5032 Chronic diastolic (congestive) heart failure: Secondary | ICD-10-CM

## 2017-05-18 DIAGNOSIS — N183 Chronic kidney disease, stage 3 (moderate): Secondary | ICD-10-CM

## 2017-05-18 DIAGNOSIS — K219 Gastro-esophageal reflux disease without esophagitis: Secondary | ICD-10-CM | POA: Diagnosis present

## 2017-05-18 DIAGNOSIS — E785 Hyperlipidemia, unspecified: Secondary | ICD-10-CM | POA: Diagnosis present

## 2017-05-18 DIAGNOSIS — L0390 Cellulitis, unspecified: Secondary | ICD-10-CM | POA: Diagnosis present

## 2017-05-18 DIAGNOSIS — Z79899 Other long term (current) drug therapy: Secondary | ICD-10-CM

## 2017-05-18 DIAGNOSIS — E11628 Type 2 diabetes mellitus with other skin complications: Principal | ICD-10-CM | POA: Diagnosis present

## 2017-05-18 DIAGNOSIS — Z89422 Acquired absence of other left toe(s): Secondary | ICD-10-CM

## 2017-05-18 DIAGNOSIS — Z91011 Allergy to milk products: Secondary | ICD-10-CM

## 2017-05-18 DIAGNOSIS — Z794 Long term (current) use of insulin: Secondary | ICD-10-CM

## 2017-05-18 DIAGNOSIS — E118 Type 2 diabetes mellitus with unspecified complications: Secondary | ICD-10-CM

## 2017-05-18 DIAGNOSIS — I130 Hypertensive heart and chronic kidney disease with heart failure and stage 1 through stage 4 chronic kidney disease, or unspecified chronic kidney disease: Secondary | ICD-10-CM | POA: Diagnosis present

## 2017-05-18 DIAGNOSIS — E1122 Type 2 diabetes mellitus with diabetic chronic kidney disease: Secondary | ICD-10-CM | POA: Diagnosis present

## 2017-05-18 LAB — COMPREHENSIVE METABOLIC PANEL
ALK PHOS: 76 U/L (ref 38–126)
ALT: 31 U/L (ref 17–63)
ANION GAP: 5 (ref 5–15)
AST: 25 U/L (ref 15–41)
Albumin: 3.3 g/dL — ABNORMAL LOW (ref 3.5–5.0)
BILIRUBIN TOTAL: 0.4 mg/dL (ref 0.3–1.2)
BUN: 24 mg/dL — ABNORMAL HIGH (ref 6–20)
CALCIUM: 8.7 mg/dL — AB (ref 8.9–10.3)
CO2: 27 mmol/L (ref 22–32)
Chloride: 105 mmol/L (ref 101–111)
Creatinine, Ser: 1.56 mg/dL — ABNORMAL HIGH (ref 0.61–1.24)
GFR calc Af Amer: 59 mL/min — ABNORMAL LOW (ref >60)
GFR calc non Af Amer: 51 mL/min — ABNORMAL LOW (ref >60)
GLUCOSE: 236 mg/dL — AB (ref 65–99)
Potassium: 4.5 mmol/L (ref 3.5–5.1)
Sodium: 137 mmol/L (ref 135–145)
Total Protein: 6.4 g/dL — ABNORMAL LOW (ref 6.5–8.1)

## 2017-05-18 LAB — CBC WITH DIFFERENTIAL/PLATELET
Basophils Absolute: 0 10*3/uL (ref 0.0–0.1)
Basophils Relative: 0 %
Eosinophils Absolute: 0.3 10*3/uL (ref 0.0–0.7)
Eosinophils Relative: 3 %
HEMATOCRIT: 34.8 % — AB (ref 39.0–52.0)
HEMOGLOBIN: 10.8 g/dL — AB (ref 13.0–17.0)
LYMPHS ABS: 1.5 10*3/uL (ref 0.7–4.0)
LYMPHS PCT: 17 %
MCH: 29 pg (ref 26.0–34.0)
MCHC: 31 g/dL (ref 30.0–36.0)
MCV: 93.3 fL (ref 78.0–100.0)
MONOS PCT: 4 %
Monocytes Absolute: 0.4 10*3/uL (ref 0.1–1.0)
NEUTROS ABS: 6.7 10*3/uL (ref 1.7–7.7)
NEUTROS PCT: 76 %
Platelets: 219 10*3/uL (ref 150–400)
RBC: 3.73 MIL/uL — ABNORMAL LOW (ref 4.22–5.81)
RDW: 13.4 % (ref 11.5–15.5)
WBC: 8.8 10*3/uL (ref 4.0–10.5)

## 2017-05-18 LAB — GLUCOSE, CAPILLARY
GLUCOSE-CAPILLARY: 216 mg/dL — AB (ref 65–99)
GLUCOSE-CAPILLARY: 226 mg/dL — AB (ref 65–99)

## 2017-05-18 LAB — CBG MONITORING, ED
GLUCOSE-CAPILLARY: 118 mg/dL — AB (ref 65–99)
Glucose-Capillary: 236 mg/dL — ABNORMAL HIGH (ref 65–99)

## 2017-05-18 MED ORDER — PANTOPRAZOLE SODIUM 40 MG PO TBEC
40.0000 mg | DELAYED_RELEASE_TABLET | Freq: Two times a day (BID) | ORAL | Status: DC
  Administered 2017-05-18 – 2017-05-22 (×8): 40 mg via ORAL
  Filled 2017-05-18 (×8): qty 1

## 2017-05-18 MED ORDER — POLYETHYLENE GLYCOL 3350 17 G PO PACK: 17.0000 g | PACK | Freq: Every day | ORAL | Status: DC | PRN

## 2017-05-18 MED ORDER — GABAPENTIN 300 MG PO CAPS
300.0000 mg | ORAL_CAPSULE | Freq: Two times a day (BID) | ORAL | Status: DC
  Administered 2017-05-18 – 2017-05-22 (×8): 300 mg via ORAL
  Filled 2017-05-18 (×8): qty 1

## 2017-05-18 MED ORDER — LISINOPRIL 40 MG PO TABS
40.0000 mg | ORAL_TABLET | Freq: Every day | ORAL | Status: DC
  Administered 2017-05-19 – 2017-05-22 (×4): 40 mg via ORAL
  Filled 2017-05-18 (×4): qty 1

## 2017-05-18 MED ORDER — SENNOSIDES-DOCUSATE SODIUM 8.6-50 MG PO TABS: 1.0000 | ORAL_TABLET | Freq: Every evening | ORAL | Status: DC | PRN

## 2017-05-18 MED ORDER — TRAMADOL HCL 50 MG PO TABS
50.0000 mg | ORAL_TABLET | Freq: Four times a day (QID) | ORAL | Status: DC | PRN
  Administered 2017-05-18 – 2017-05-21 (×10): 50 mg via ORAL
  Filled 2017-05-18 (×10): qty 1

## 2017-05-18 MED ORDER — PIPERACILLIN-TAZOBACTAM 3.375 G IVPB 30 MIN
3.3750 g | Freq: Once | INTRAVENOUS | Status: AC
  Administered 2017-05-18: 3.375 g via INTRAVENOUS
  Filled 2017-05-18: qty 50

## 2017-05-18 MED ORDER — ACETAMINOPHEN 650 MG RE SUPP: 650.0000 mg | Freq: Four times a day (QID) | RECTAL | Status: DC | PRN

## 2017-05-18 MED ORDER — FLUTICASONE PROPIONATE 50 MCG/ACT NA SUSP
2.0000 | Freq: Every day | NASAL | Status: DC
  Administered 2017-05-20: 2 via NASAL
  Filled 2017-05-18: qty 16

## 2017-05-18 MED ORDER — ASPIRIN EC 81 MG PO TBEC
81.0000 mg | DELAYED_RELEASE_TABLET | Freq: Every day | ORAL | Status: DC
  Administered 2017-05-19 – 2017-05-22 (×4): 81 mg via ORAL
  Filled 2017-05-18 (×4): qty 1

## 2017-05-18 MED ORDER — INSULIN ASPART 100 UNIT/ML ~~LOC~~ SOLN
0.0000 [IU] | Freq: Three times a day (TID) | SUBCUTANEOUS | Status: DC
  Administered 2017-05-19 (×2): 3 [IU] via SUBCUTANEOUS
  Administered 2017-05-19: 8 [IU] via SUBCUTANEOUS
  Administered 2017-05-20: 5 [IU] via SUBCUTANEOUS

## 2017-05-18 MED ORDER — GLIPIZIDE ER 10 MG PO TB24
10.0000 mg | ORAL_TABLET | Freq: Two times a day (BID) | ORAL | Status: DC
  Administered 2017-05-18 – 2017-05-21 (×6): 10 mg via ORAL
  Filled 2017-05-18 (×7): qty 1

## 2017-05-18 MED ORDER — ENOXAPARIN SODIUM 40 MG/0.4ML ~~LOC~~ SOLN
40.0000 mg | SUBCUTANEOUS | Status: DC
  Administered 2017-05-18 – 2017-05-21 (×4): 40 mg via SUBCUTANEOUS
  Filled 2017-05-18 (×4): qty 0.4

## 2017-05-18 MED ORDER — FUROSEMIDE 20 MG PO TABS
40.0000 mg | ORAL_TABLET | Freq: Once | ORAL | Status: AC
Start: 2017-05-18 — End: 2017-05-18
  Administered 2017-05-18: 40 mg via ORAL
  Filled 2017-05-18: qty 2

## 2017-05-18 MED ORDER — FLUOXETINE HCL 20 MG PO CAPS
40.0000 mg | ORAL_CAPSULE | Freq: Every day | ORAL | Status: DC
  Administered 2017-05-19 – 2017-05-22 (×4): 40 mg via ORAL
  Filled 2017-05-18 (×4): qty 2

## 2017-05-18 MED ORDER — ATORVASTATIN CALCIUM 80 MG PO TABS
80.0000 mg | ORAL_TABLET | Freq: Every day | ORAL | Status: DC
  Administered 2017-05-19 – 2017-05-21 (×3): 80 mg via ORAL
  Filled 2017-05-18 (×3): qty 1

## 2017-05-18 MED ORDER — INSULIN GLARGINE 100 UNIT/ML ~~LOC~~ SOLN
15.0000 [IU] | Freq: Two times a day (BID) | SUBCUTANEOUS | Status: DC
  Administered 2017-05-18 – 2017-05-22 (×8): 15 [IU] via SUBCUTANEOUS
  Filled 2017-05-18 (×9): qty 0.15

## 2017-05-18 MED ORDER — FOLIC ACID 1 MG PO TABS
1.0000 mg | ORAL_TABLET | Freq: Every day | ORAL | Status: DC
  Administered 2017-05-19 – 2017-05-22 (×4): 1 mg via ORAL
  Filled 2017-05-18 (×4): qty 1

## 2017-05-18 MED ORDER — ARIPIPRAZOLE 2 MG PO TABS
4.0000 mg | ORAL_TABLET | Freq: Every day | ORAL | Status: DC
  Administered 2017-05-19 – 2017-05-22 (×4): 4 mg via ORAL
  Filled 2017-05-18 (×4): qty 2

## 2017-05-18 MED ORDER — ACETAMINOPHEN 325 MG PO TABS: 650.0000 mg | ORAL_TABLET | Freq: Four times a day (QID) | ORAL | Status: DC | PRN

## 2017-05-18 MED ORDER — AMLODIPINE BESYLATE 10 MG PO TABS
10.0000 mg | ORAL_TABLET | Freq: Every day | ORAL | Status: DC
  Administered 2017-05-19 – 2017-05-22 (×4): 10 mg via ORAL
  Filled 2017-05-18 (×4): qty 1

## 2017-05-18 MED ORDER — CARVEDILOL 12.5 MG PO TABS
12.5000 mg | ORAL_TABLET | Freq: Two times a day (BID) | ORAL | Status: DC
  Administered 2017-05-18 – 2017-05-22 (×8): 12.5 mg via ORAL
  Filled 2017-05-18 (×8): qty 1

## 2017-05-18 NOTE — ED Notes (Signed)
CBG at 17:00 was 118.

## 2017-05-18 NOTE — H&P (Signed)
Date: 05/18/2017               Patient Name:  Shane [name] MRN: [id number]  DOB: [birth year] Age / Sex: 46 y.o., male   PCP: Arnoldo Morale, MD         Medical Service: Internal Medicine Teaching Service         Attending Physician: Dr. Lucious Groves, DO    First Contact: Dr. Lars Mage Pager: [phone]  Second Contact: Dr. Alphonzo Grieve Pager: [phone]       After Hours (After 5p/  First Contact Pager: [phone]  weekends / holidays): Second Contact Pager: [phone]   Chief Complaint: Right foot pain and swelling  History of Present Illness: Mr. Heart is a 46 year old male with a past medical history of hypertension, hyperlipidemia, chronic diastolic heart failure, internal carotid stenosis, type 2 diabetes mellitus, bipolar disorder who presents with right foot pain and swelling for the past 24 hours. The patient states that the right foot pain is present on the dorsal aspect of his foot, travels up the right calf, it is 10/10 in intensity, achy in nature, worsens when he places pressure on his foot, and alleviated when he lays down. The patient used one dose of ibuprofen 800mg  once last night which helped. He states that he has also noticed some drainage from the foot that is white in color and foul smelling. He states that he has noticed some redness at the site as well.  He has had some tingling in his lower extremities.  He states that he has not had any trauma to the area.  The patient had a transmetatarsal amputation on 06/26/2016 by Dr. Sharol Given. The patient had lower extremity Doppler done on 05/12/2017 which showed no evidence of DVT in the left lower extremity or SVT.  There was also no evidence of common femoral vein obstruction  The patient states that his blood glucose measurements at home usually range 70-298 and are usually in the 120s.  The patient states that he is compliant with his medication and has not missed any doses recently.  He states that he has occasional  headaches, had epigastric abdominal pain the last 1 hour and yesterday night,  3 episodes of diarrhea, and fatigue.  The patient reports gaining 10 pounds in the past 3 weeks.  He states that he does not eat salty foods but he eats canned beans nightly.  The patient denies any nausea, vomiting.  ED Course:  CT right foot-right foot soft tissue swelling. No evidence of osteomyelitis -started on zosyn  -Admitted for IV antibiotics   Meds:  Current Meds  Medication Sig  . amLODipine (NORVASC) 10 MG tablet Take 1 tablet (10 mg total) by mouth daily.  . ARIPiprazole (ABILIFY) 2 MG tablet Take 2 tablets (4 mg total) by mouth daily.  Marland Kitchen aspirin EC 81 MG tablet Take 1 tablet (81 mg total) by mouth daily.  Marland Kitchen atorvastatin (LIPITOR) 80 MG tablet Take 1 tablet (80 mg total) by mouth every morning.  . carvedilol (COREG) 12.5 MG tablet TAKE 1 TABLET BY MOUTH 2 TIMES DAILY WITH A MEAL (Patient taking differently: Take 12.5 mg by mouth 2 (two) times daily with a meal. )  . FLUoxetine (PROZAC) 40 MG capsule Take 1 capsule (40 mg total) by mouth daily.  . fluticasone (FLONASE) 50 MCG/ACT nasal spray Place 2 sprays into both nostrils daily.  . folic acid (FOLVITE) 1 MG tablet Take 1 tablet (1 mg total) by  mouth daily.  Marland Kitchen gabapentin (NEURONTIN) 300 MG capsule Take 1 capsule (300 mg total) by mouth 2 (two) times daily.  Marland Kitchen glipiZIDE (GLUCOTROL XL) 10 MG 24 hr tablet Take 2 tablets (20 mg total) by mouth daily with breakfast. (Patient taking differently: Take 10 mg by mouth 2 (two) times daily. )  . ibuprofen (ADVIL,MOTRIN) 800 MG tablet TAKE 1 TABLET BY MOUTH EVERY 8 HOURS AS NEEDED. (Patient taking differently: TAKE 1 TABLET (800mg ) BY MOUTH EVERY 8 HOURS AS NEEDED FOR PAIN)  . Insulin Glargine (LANTUS SOLOSTAR) 100 UNIT/ML Solostar Pen Inject 30 Units into the skin 2 (two) times daily.  Marland Kitchen lisinopril (PRINIVIL,ZESTRIL) 40 MG tablet Take 1 tablet (40 mg total) by mouth daily.  . ondansetron (ZOFRAN ODT) 8 MG  disintegrating tablet Take 1 tablet (8 mg total) by mouth every 8 (eight) hours as needed for nausea or vomiting.  . pantoprazole (PROTONIX) 40 MG tablet Take 1 tablet (40 mg total) by mouth 2 (two) times daily before a meal.  . polyethylene glycol (MIRALAX / GLYCOLAX) packet Take 17 g by mouth daily as needed for moderate constipation.  . senna-docusate (SENOKOT-S) 8.6-50 MG tablet Take 1 tablet by mouth at bedtime. (Patient taking differently: Take 1 tablet by mouth at bedtime as needed for mild constipation. )  . traZODone (DESYREL) 100 MG tablet TAKE 2 TABLETS (200 MG TOTAL) BY MOUTH AT BEDTIME.     Allergies: Allergies as of 05/18/2017 - Review Complete 05/18/2017  Allergen Reaction Noted  . Lactose intolerance (gi) Diarrhea and Other (See Comments) 06/08/2016  . Other Other (See Comments) 07/15/2015  . Milk-related compounds Diarrhea and Other (See Comments) 02/19/2015   Past Medical History:  Diagnosis Date  . Anemia   . Bipolar disorder ([place])   . Cellulitis and abscess of left leg 06/2016  . Chest pain    a. 2015 Reportedly normal stress test in [place].  Marland Kitchen Chronic diastolic CHF (congestive heart failure) ([facility])    a.03/2015 Echo: EF 55-60%, Gr 1 DD, mild MR, triv PR.  . Depression   . Depression with anxiety   . Dyspnea    with exertion  . GERD (gastroesophageal reflux disease)   . Glaucoma   . Hyperlipidemia   . Hypertension    a. 08/2014 Admitted with hypertensive urgency.  . Insomnia   . Internal carotid artery stenosis   . Lower GI bleed 07/04/2015  . Neuropathy   . Refusal of blood transfusions as patient is Jehov[employer]'s Witness   . TIA (transient ischemic attack) 08/2014; 03/2015   a. 08/2014 in setting of hypertensive urgency.  . Type II diabetes mellitus ([place])    started insulin spring 2016, Type II  . Vitamin D deficiency spring 2016    Family History:  Family History  Problem Relation Age of Onset  . Hypertension Mother   . Diabetes Mother   . Hyperlipidemia  Mother   . Heart disease Mother        s/p pacemaker  . Diabetes Father   . Hypertension Father   . Stroke Father   . Heart attack Father        first MI @ 96.  . Depression Father   . Dementia Father   . Stroke Brother   . ADD / ADHD Brother   . Anxiety disorder Brother   . Bipolar disorder Brother   . OCD Brother   . Sexual abuse Brother    Social History:   Social History   Socioeconomic History  .  Marital status: Married    Spouse name: None  . Number of children: 1  . Years of education: None  . Highest education level: None  Social Needs  . Financial resource strain: None  . Food insecurity - worry: None  . Food insecurity - inability: None  . Transportation needs - medical: None  . Transportation needs - non-medical: None  Occupational History  . Occupation: disabled  Tobacco Use  . Smoking status: Never Smoker  . Smokeless tobacco: Never Used  Substance and Sexual Activity  . Alcohol use: No    Alcohol/week: 0.0 oz  . Drug use: No  . Sexual activity: No  Other Topics Concern  . None  Social History Narrative   Lives in [place] with wife.  Active but doesn't routinely exercise.   Lives with wife and granddaughter in [place] so.  Has never drank, no smoking, no drugs.  Review of Systems: A complete ROS was negative except as per HPI.   Physical Exam: Blood pressure (!) 144/88, pulse 78, temperature 97.9 F (36.6 C), temperature source Oral, resp. rate (!) 21, height 5\' 7"  (1.702 m), weight 193 lb (87.5 kg), SpO2 96 %.  Physical Exam  Constitutional: He appears well-developed and well-nourished. No distress.  HENT:  Head: Normocephalic and atraumatic.  Eyes: Conjunctivae are normal.  Cardiovascular: Normal rate, regular rhythm, normal heart sounds and intact distal pulses.  Respiratory: Effort normal and breath sounds normal. No respiratory distress. He has no wheezes.  GI: Soft. Bowel sounds are normal. He exhibits no distension. There is no tenderness  (Left lower quadrant at site of insulin injection).  Small amount of bruising noted in the left lower quadrant  Musculoskeletal:  Left foot transmetatarsal amputation  Neurological: He is alert.  5/5 bilateral upper extremity strength.  4/5 left lower extremity strength. 2/5 right lower extremity strength.  The patient was not able to extend his right leg easily after told to flex it.  Absent sensation on dorsal aspect of right foot up to the right ankle. Babinski absent on the right foot.  Skin: He is not diaphoretic.  7 cm linear hypopigmented hyperkeloid scar noted on the dorsal aspect of the right foot without any erythema or drainage noted.    Psychiatric: He has a normal mood and affect. His behavior is normal. Judgment and thought content normal.   BMP Latest Ref Rng & Units 05/18/2017 05/12/2017 01/29/2017  Glucose 65 - 99 mg/dL 236(H) 84 165(H)  BUN 6 - 20 mg/dL 24(H) 16 17  Creatinine 0.61 - 1.24 mg/dL 1.56(H) 1.35(H) 1.29(H)  BUN/Creat Ratio 9 - 20 - 12 -  Sodium 135 - 145 mmol/L 137 146(H) 139  Potassium 3.5 - 5.1 mmol/L 4.5 4.7 4.3  Chloride 101 - 111 mmol/L 105 105 104  CO2 22 - 32 mmol/L 27 26 28   Calcium 8.9 - 10.3 mg/dL 8.7(L) 9.8 9.1    CBC    Component Value Date/Time   WBC 8.8 05/18/2017 1246   RBC 3.73 (L) 05/18/2017 1246   HGB 10.8 (L) 05/18/2017 1246   HGB 10.6 (L) 01/20/2017 1032   HGB 9.8 (L) 02/09/2016 1030   HCT 34.8 (L) 05/18/2017 1246   HCT 32.4 (L) 01/20/2017 1032   HCT 30.7 (L) 02/09/2016 1030   PLT 219 05/18/2017 1246   PLT 301 01/20/2017 1032   MCV 93.3 05/18/2017 1246   MCV 93 01/20/2017 1032   MCV 94.5 02/09/2016 1030   MCH 29.0 05/18/2017 1246   MCHC 31.0  05/18/2017 1246   RDW 13.4 05/18/2017 1246   RDW 13.1 01/20/2017 1032   RDW 14.3 02/09/2016 1030   LYMPHSABS 1.5 05/18/2017 1246   LYMPHSABS 1.9 02/09/2016 1030   MONOABS 0.4 05/18/2017 1246   MONOABS 0.4 02/09/2016 1030   EOSABS 0.3 05/18/2017 1246   EOSABS 0.3 02/09/2016 1030    BASOSABS 0.0 05/18/2017 1246   BASOSABS 0.1 02/09/2016 1030   CMP     Component Value Date/Time   NA 137 05/18/2017 1246   NA 146 (H) 05/12/2017 1003   K 4.5 05/18/2017 1246   CL 105 05/18/2017 1246   CO2 27 05/18/2017 1246   GLUCOSE 236 (H) 05/18/2017 1246   BUN 24 (H) 05/18/2017 1246   BUN 16 05/12/2017 1003   CREATININE 1.56 (H) 05/18/2017 1246   CREATININE 1.79 (H) 08/02/2016 1409   CALCIUM 8.7 (L) 05/18/2017 1246   PROT 6.4 (L) 05/18/2017 1246   PROT 7.4 05/12/2017 1003   ALBUMIN 3.3 (L) 05/18/2017 1246   ALBUMIN 4.2 05/12/2017 1003   AST 25 05/18/2017 1246   ALT 31 05/18/2017 1246   ALKPHOS 76 05/18/2017 1246   BILITOT 0.4 05/18/2017 1246   BILITOT <0.2 05/12/2017 1003   GFRNONAA 51 (L) 05/18/2017 1246   GFRNONAA 44 (L) 08/02/2016 1409   GFRAA 59 (L) 05/18/2017 1246   GFRAA 51 (L) 08/02/2016 1409    Cbc=236,   CT right foot without contrast (05/18/17): No evidence of osteomyelitis, no drainable fluid collection or subcutaneous emphysema, soft tissue swelling in the dorsal aspect of the right foot present.  Assessment & Plan by Problem:  46 year old male with past medical history of essential hypertension, hyperlipidemia, diabetes mellitus type 2, CKD 3 presents with right dorsal foot pain and sensation deficits in distal right foot.   Dorsal right foot cellulitis The patient presented with a 1 day history of right foot swelling, erythema, and pain.  CT of the right foot did not show any evidence for osteomyelitis but there was some soft tissue swelling noted. The patient likely has cellulitis on the dorsal aspect of his right foot. The patient was afebrile on admission and white blood cell count was normal at 8.8.  -The patient was given zosyn in the ED -May consider cephalexin for cellulitis tomorrow 05/19/17 -tramadol 50mg  [id number] prn  Diastolic heart failure The patient's last echo was 08/13/2016 which showed improved ejection fraction from 55-60% to 60-65%, no  regional wall motion abnormality right ventricular size normal, no diastolic dysfunction noted.   The patient was previously on lasix 40mg  every other day, but per chart review the patient reported not taking it in September 2018.   -Gave one time dose of lasix 40mg    Diabetes mellitus The patient's last A1c was 11.4 on 05/07/2017.  Patient takes glipizide 10 mg bid and Lantus 30 mg twice daily at home.   -Lantus 15 units twice daily -Continue glipizide 10 mg twice daily -Continue aspirin 81 mg daily -Continue atorvastatin 81 mg daily -Continue gabapentin 300mg  bid -SSI  Hypertension The patient blood pressure since admission has ranged 118-155/80-95 -Continue carvedilol 12.5 mg twice daily, lisinopril 40 mg daily  Normocytic Anemia  The patient's hb=10.8 and hct=34.8 on admission with mcv=93. Ferritin=410 in 01/20/17. The patient's cellulitis and ckd 3 place the patient at risk for anemia of chronic disease.   Depression: -continue aripiprazole 4mg  qd   DVT PPX: lovenox 40mg  qd   Dispo: Admit patient to Inpatient with expected length of stay greater than 2  midnights.  Signed: Lars Mage, MD Internal Medicine PGY1 Pager:[phone] 05/18/2017, 4:58 PM

## 2017-05-18 NOTE — ED Notes (Signed)
Attempted Report 

## 2017-05-18 NOTE — Plan of Care (Signed)
  Clinical Measurements: Diagnostic test results will improve 05/18/2017 1740 - Progressing by Williams Che, RN   Nutrition: Adequate nutrition will be maintained 05/18/2017 1740 - Progressing by Williams Che, RN   Coping: Level of anxiety will decrease 05/18/2017 1740 - Progressing by Williams Che, RN   Pain Managment: General experience of comfort will improve 05/18/2017 1740 - Progressing by Williams Che, RN   Safety: Ability to remain free from injury will improve 05/18/2017 1740 - Progressing by Williams Che, RN

## 2017-05-18 NOTE — ED Provider Notes (Signed)
[facility] EMERGENCY DEPARTMENT Provider Note   CSN: [id number] Arrival date & time: 05/18/17  1209     History   Chief Complaint Chief Complaint  Patient presents with  . Foot Pain    HPI   Blood pressure 119/82, pulse 71, temperature 97.9 F (36.6 C), temperature source Oral, resp. rate (!) 21, height 5\' 7"  (1.702 m), weight 87.5 kg (193 lb), SpO2 95 %.  Shane [name] is a 46 y.o. male sling dependent diabetic with CHF, bipolar status post multiple toe amputations on the left foot by Dr. due to in the remote past complaining of right foot pain and swelling worsening x24 hours.  He states that he cannot feel his toes.  There was no known inciting trauma.  There is no associated pain foot.  He reports subjective fever and chills last night with associated palpitations and no nausea, vomiting.  He states his blood sugars have been running slightly higher than normal.  Past Medical History:  Diagnosis Date  . Anemia   . Bipolar disorder ([place])   . Cellulitis and abscess of left leg 06/2016  . Chest pain    a. 2015 Reportedly normal stress test in [place].  Marland Kitchen Chronic diastolic CHF (congestive heart failure) (HCC)    a.03/2015 Echo: EF 55-60%, Gr 1 DD, mild MR, triv PR.  . Depression   . Depression with anxiety   . Dyspnea    with exertion  . GERD (gastroesophageal reflux disease)   . Glaucoma   . Hyperlipidemia   . Hypertension    a. 08/2014 Admitted with hypertensive urgency.  . Insomnia   . Internal carotid artery stenosis   . Lower GI bleed 07/04/2015  . Neuropathy   . Refusal of blood transfusions as patient is Jehovah's Witness   . TIA (transient ischemic attack) 08/2014; 03/2015   a. 08/2014 in setting of hypertensive urgency.  . Type II diabetes mellitus ([place])    started insulin spring 2016, Type II  . Vitamin D deficiency spring 2016    Patient Active Problem List   Diagnosis Date Noted  . Achilles tendon contracture, left 01/14/2017  . Gingivitis  11/14/2016  . Achilles tendon contracture, right 09/17/2016  . Pain and swelling of left lower leg 09/16/2016  . CKD (chronic kidney disease) stage 3, GFR 30-59 ml/min (HCC) 08/27/2016  . Dyslipidemia associated with type 2 diabetes mellitus ([place]) 07/22/2016  . Constipation 07/02/2016  . S/P transmetatarsal amputation of foot, left ([place]) 07/02/2016  . Dyspnea on exertion 07/02/2016  . Diabetic gastroparesis ([place]) 05/30/2016  . Buzzing in ear, right 03/26/2016  . Joint pain 03/11/2016  . Weakness 03/08/2016  . Dizziness 03/03/2016  . Chronic diastolic CHF (congestive heart failure), NYHA class 1 ([place]) 12/30/2015  . Diabetes mellitus type 2, uncontrolled, with complications ([place]) 40/98/1191  . Depression with anxiety 12/01/2015  . Pain in the chest 12/01/2015  . Insomnia 11/21/2015  . GERD (gastroesophageal reflux disease) 08/18/2015  . Erectile dysfunction 07/04/2015  . Symptomatic anemia 04/24/2015  . Left arm weakness 04/02/2015  . Sensory disturbance 04/02/2015  . Essential hypertension 04/02/2015  . Hemispheric carotid artery syndrome   . HLD (hyperlipidemia)   . Headache     Past Surgical History:  Procedure Laterality Date  . AMPUTATION Left 08/03/2015   Procedure: AMPUTATION LEFT GREAT TOE;  Surgeon: Newt Minion, MD;  Location: [facility];  Service: Orthopedics;  Laterality: Left;  . AMPUTATION Left 12/02/2015   Procedure: amputation of left 2nd  digit  . AMPUTATION Left 04/10/2016   Procedure: Left 2nd Toe Amputation at MTP Joint;  Surgeon: Newt Minion, MD;  Location: [facility];  Service: Orthopedics;  Laterality: Left;  . AMPUTATION Left 06/09/2016   Procedure: AMPUTATION LEFT THIRD TOE;  Surgeon: [name], MD;  Location: [facility];  Service: Orthopedics;  Laterality: Left;  . AMPUTATION Left 06/26/2016   Procedure: Left Foot Transmetatarsal Amputation;  Surgeon: Newt Minion, MD;  Location: [facility];  Service: Orthopedics;  Laterality: Left;  . APPLICATION OF WOUND VAC Left  12/19/2015   Procedure: APPLICATION OF WOUND VAC;  Surgeon: Meredith Pel, MD;  Location: [facility];  Service: Orthopedics;  Laterality: Left;  . CIRCUMCISION    . COLONOSCOPY N/A 01/22/2016   Procedure: COLONOSCOPY;  Surgeon: Gatha Mayer, MD;  Location: [facility];  Service: Endoscopy;  Laterality: N/A;  . ESOPHAGOGASTRODUODENOSCOPY N/A 01/19/2016   Procedure: ESOPHAGOGASTRODUODENOSCOPY (EGD);  Surgeon: Manus Gunning, MD;  Location: [facility];  Service: Gastroenterology;  Laterality: N/A;  . ESOPHAGOGASTRODUODENOSCOPY N/A 01/22/2016   Procedure: ESOPHAGOGASTRODUODENOSCOPY (EGD);  Surgeon: Gatha Mayer, MD;  Location[facility] ENDOSCOPY;  Service: Endoscopy;  Laterality: N/A;  . I&D EXTREMITY Left 12/02/2015   Procedure: IRRIGATION AND DEBRIDEMENT OF FOOT; LEFT SECOND TOE AMPUTATION;  Surgeon: Meredith Pel, MD;  Location: [facility];  Service: Orthopedics;  Laterality: Left;  . I&D EXTREMITY Left 12/19/2015   Procedure: I & D LEFT FOOT WITH BEADS ;  Surgeon: Meredith Pel, MD;  Location: [facility];  Service: Orthopedics;  Laterality: Left;  . I&D EXTREMITY Right 01/17/2016   Procedure: IRRIGATION AND DEBRIDEMENT RIGHT FOOT;  Surgeon: Newt Minion, MD;  Location: [facility];  Service: Orthopedics;  Laterality: Right;  . INCISION AND DRAINAGE FOOT Right 01/17/2016  . INGUINAL HERNIA REPAIR Bilateral ~ 1983- ~ 1986  . TONSILLECTOMY  ~ 1985       Home Medications    Prior to Admission medications   Medication Sig Start Date End Date Taking? Authorizing Provider  amLODipine (NORVASC) 10 MG tablet Take 1 tablet (10 mg total) by mouth daily. 01/20/17   Funches, Adriana Mccallum, MD  ARIPiprazole (ABILIFY) 2 MG tablet Take 2 tablets (4 mg total) by mouth daily. 11/04/16   Aundra Dubin, MD  aspirin EC 81 MG tablet Take 1 tablet (81 mg total) by mouth daily. 09/16/16   Funches, Adriana Mccallum, MD  atorvastatin (LIPITOR) 80 MG tablet Take 1 tablet (80 mg total) by mouth every morning. 03/03/17   Arnoldo Morale,  MD  Blood Glucose Monitoring Suppl (TRUE METRIX METER) DEVI 1 each by Does not apply route 3 (three) times daily. 03/17/17   Arnoldo Morale, MD  carvedilol (COREG) 12.5 MG tablet TAKE 1 TABLET BY MOUTH 2 TIMES DAILY WITH A MEAL 03/03/17   Arnoldo Morale, MD  Elastic Bandages & Supports (T.E.D. KNEE LENGTH/S-REGULAR) MISC Wear when awake 09/20/16   Pollina, Gwenyth Allegra, MD  FLUoxetine (PROZAC) 40 MG capsule Take 1 capsule (40 mg total) by mouth daily. 11/04/16   Aundra Dubin, MD  fluticasone (FLONASE) 50 MCG/ACT nasal spray Place 2 sprays into both nostrils daily. 09/01/16   Julianne Rice, MD  folic acid (FOLVITE) 1 MG tablet Take 1 tablet (1 mg total) by mouth daily. 07/04/16   Asencion Partridge, MD  gabapentin (NEURONTIN) 300 MG capsule Take 1 capsule (300 mg total) by mouth 2 (two) times daily. 05/07/17   Arnoldo Morale, MD  glipiZIDE (GLUCOTROL XL) 10 MG 24 hr  tablet Take 2 tablets (20 mg total) by mouth daily with breakfast. 03/03/17   Arnoldo Morale, MD  glucose blood (ACCU-CHEK AVIVA PLUS) test strip Use as instructed 02/24/17   Arnoldo Morale, MD  glucose blood (TRUE METRIX BLOOD GLUCOSE TEST) test strip 1 each by Other route 3 (three) times daily. 03/17/17   Arnoldo Morale, MD  ibuprofen (ADVIL,MOTRIN) 800 MG tablet TAKE 1 TABLET BY MOUTH EVERY 8 HOURS AS NEEDED. Patient taking differently: TAKE 1 TABLET BY MOUTH EVERY 8 HOURS AS NEEDED FOR PAIN 01/10/17   Boykin Nearing, MD  Insulin Glargine (LANTUS SOLOSTAR) 100 UNIT/ML Solostar Pen Inject 30 Units into the skin 2 (two) times daily. 05/07/17   Arnoldo Morale, MD  Insulin Pen Needle (B-D ULTRAFINE III SHORT PEN) 31G X 8 MM MISC 1 application by Does not apply route 3 (three) times daily. 08/02/16   Boykin Nearing, MD  Lancet Devices [facility]) lancets Use as instructed 08/06/16   Boykin Nearing, MD  lisinopril (PRINIVIL,ZESTRIL) 40 MG tablet Take 1 tablet (40 mg total) by mouth daily. 03/03/17   Arnoldo Morale, MD  ondansetron (ZOFRAN  ODT) 8 MG disintegrating tablet Take 1 tablet (8 mg total) by mouth every 8 (eight) hours as needed for nausea or vomiting. 11/12/16   Boykin Nearing, MD  pantoprazole (PROTONIX) 40 MG tablet Take 1 tablet (40 mg total) by mouth 2 (two) times daily before a meal. 02/14/17   Lemmon, Lavone Nian, PA  polyethylene glycol (MIRALAX / GLYCOLAX) packet Take 17 g by mouth daily as needed for moderate constipation. 07/03/16   Asencion Partridge, MD  senna-docusate (SENOKOT-S) 8.6-50 MG tablet Take 1 tablet by mouth at bedtime. Patient taking differently: Take 1 tablet by mouth at bedtime as needed for mild constipation.  07/03/16   Asencion Partridge, MD  sucralfate (CARAFATE) 1 g tablet Take 1 tablet (1 g total) by mouth 4 (four) times daily -  with meals and at bedtime. 02/14/17   Levin Erp, PA  traZODone (DESYREL) 100 MG tablet TAKE 2 TABLETS (200 MG TOTAL) BY MOUTH AT BEDTIME. 12/30/16   Aundra Dubin, MD  traZODone (DESYREL) 100 MG tablet TAKE 2 TABLETS (200 MG TOTAL) BY MOUTH AT BEDTIME. 02/24/17   Eksir, Richard Miu, MD  TRUEPLUS LANCETS 28G MISC 1 each by Does not apply route 3 (three) times daily. 03/17/17   Arnoldo Morale, MD  Vitamin D, Ergocalciferol, (DRISDOL) 50000 units CAPS capsule Take 1 capsule (50,000 Units total) by mouth every 30 (thirty) days. Every 30 days Patient not taking: Reported on 05/07/2017 04/26/16   Boykin Nearing, MD    Family History Family History  Problem Relation Age of Onset  . Hypertension Mother   . Diabetes Mother   . Hyperlipidemia Mother   . Heart disease Mother        s/p pacemaker  . Diabetes Father   . Hypertension Father   . Stroke Father   . Heart attack Father        first MI @ 65.  . Depression Father   . Dementia Father   . Stroke Brother   . ADD / ADHD Brother   . Anxiety disorder Brother   . Bipolar disorder Brother   . OCD Brother   . Sexual abuse Brother     Social History Social History   Tobacco Use  . Smoking  status: Never Smoker  . Smokeless tobacco: Never Used  Substance Use Topics  . Alcohol use: No    Alcohol/week:  0.0 oz  . Drug use: No     Allergies   Lactose intolerance (gi); Other; and Milk-related compounds   Review of Systems Review of Systems  A complete review of systems was obtained and all systems are negative except as noted in the HPI and PMH.    Physical Exam Updated Vital Signs BP (!) 137/92   Pulse 77   Temp 97.9 F (36.6 C) (Oral)   Resp (!) 21   Ht 5\' 7"  (1.702 m)   Wt 87.5 kg (193 lb)   SpO2 99%   BMI 30.23 kg/m   Physical Exam  Constitutional: He is oriented to person, place, and time. He appears well-developed and well-nourished. No distress.  HENT:  Head: Normocephalic and atraumatic.  Mouth/Throat: Oropharynx is clear and moist.  Eyes: Conjunctivae and EOM are normal. Pupils are equal, round, and reactive to light.  Neck: Normal range of motion.  Cardiovascular: Normal rate, regular rhythm and intact distal pulses.  Pulmonary/Chest: Effort normal and breath sounds normal.  Abdominal: Soft. There is no tenderness.  Musculoskeletal: Normal range of motion.  Left foot with amputation of all toes.  Right foot with edema, surgical scar on the dorsum, DP and PT pulses auscultated will by Doppler.  Cap refill brisk, foot is warm and well perfused.  Patient has complete anesthesia to sensation on x5 toes.  Neurological: He is alert and oriented to person, place, and time.  Skin: He is not diaphoretic.  Psychiatric: He has a normal mood and affect.  Nursing note and vitals reviewed.    ED Treatments / Results  Labs (all labs ordered are listed, but only abnormal results are displayed) Labs Reviewed  CBC WITH DIFFERENTIAL/PLATELET - Abnormal; Notable for the following components:      Result Value   RBC 3.73 (*)    Hemoglobin 10.8 (*)    HCT 34.8 (*)    All other components within normal limits  COMPREHENSIVE METABOLIC PANEL - Abnormal; Notable  for the following components:   Glucose, Bld 236 (*)    BUN 24 (*)    Creatinine, Ser 1.56 (*)    Calcium 8.7 (*)    Total Protein 6.4 (*)    Albumin 3.3 (*)    GFR calc non Af Amer 51 (*)    GFR calc Af Amer 59 (*)    All other components within normal limits  CBG MONITORING, ED - Abnormal; Notable for the following components:   Glucose-Capillary 236 (*)    All other components within normal limits    EKG  EKG Interpretation None       Radiology Ct Foot Right Wo Contrast  Result Date: 05/18/2017 CLINICAL DATA:  Right foot pain and swelling. EXAM: CT OF THE RIGHT FOOT WITHOUT CONTRAST TECHNIQUE: Multidetector CT imaging of the right foot was performed according to the standard protocol. Multiplanar CT image reconstructions were also generated. COMPARISON:  Right foot x-rays dated September 17, 2016. FINDINGS: Bones/Joint/Cartilage No acute fracture or malalignment. No significant degenerative changes. No cortical destruction or osteolysis. No tibiotalar joint effusion. Bone mineralization is normal. Ligaments Suboptimally assessed by CT. Muscles and Tendons No focal abnormality.  No evidence of tenosynovitis. Soft tissues Prominent dorsal forefoot soft tissue swelling without drainable fluid collection. No subcutaneous emphysema. Atherosclerotic vascular calcifications. IMPRESSION: 1. Prominent dorsal forefoot soft tissue swelling as can be seen with cellulitis. No drainable fluid collection or subcutaneous emphysema. 2. No acute osseous abnormality.  No CT evidence of osteomyelitis. Electronically Signed  By: Titus Dubin M.D.   On: 05/18/2017 14:25    Procedures Procedures (including critical care time)  Medications Ordered in ED Medications  piperacillin-tazobactam (ZOSYN) IVPB 3.375 g (3.375 g Intravenous New Bag/Given 05/18/17 1444)     Initial Impression / Assessment and Plan / ED Course  I have reviewed the triage vital signs and the nursing notes.  Pertinent labs &  imaging results that were available during my care of the patient were reviewed by me and considered in my medical decision making (see chart for details).     Vitals:   05/18/17 1220 05/18/17 1345 05/18/17 1428 05/18/17 1429  BP: 119/82  (!) 137/92   Pulse: 76 71  77  Resp: 18 (!) 21    Temp: 97.9 F (36.6 C)     TempSrc: Oral     SpO2: 100% 95%  99%  Weight: 87.5 kg (193 lb)     Height: 5\' 7"  (1.702 m)       Medications  piperacillin-tazobactam (ZOSYN) IVPB 3.375 g (3.375 g Intravenous New Bag/Given 05/18/17 1444)    Diamonte Stavely is 46 y.o. male presenting with rapidly progressing edema and anesthesia to the right lower extremity, patient has good pulses, foot is warm and well perfused, there is significant edema along the metatarsals.  Mild warmth.  CT with no convincing signs of osteomyelitis, patient started on Zosyn (MRSA by PCR negative on last admission) given his rapidly progressing symptoms will need admission for IV antibiotics.  Unassigned admission to internal medicine teaching service.  Final Clinical Impressions(s) / ED Diagnoses   Final diagnoses:  Cellulitis in diabetic foot [facility])    ED Discharge Orders    None       Waynetta Pean 05/18/17 1453    Pattricia Boss, MD 05/18/17 [phone]

## 2017-05-18 NOTE — ED Triage Notes (Signed)
Pt reports he has swelling in  His feet and drainage on the rt foot

## 2017-05-19 ENCOUNTER — Other Ambulatory Visit (HOSPITAL_COMMUNITY): Payer: Self-pay | Admitting: Psychiatry

## 2017-05-19 ENCOUNTER — Ambulatory Visit (HOSPITAL_COMMUNITY): Payer: Self-pay | Admitting: Licensed Clinical Social Worker

## 2017-05-19 DIAGNOSIS — Z79899 Other long term (current) drug therapy: Secondary | ICD-10-CM | POA: Diagnosis not present

## 2017-05-19 DIAGNOSIS — Z8619 Personal history of other infectious and parasitic diseases: Secondary | ICD-10-CM

## 2017-05-19 DIAGNOSIS — E1140 Type 2 diabetes mellitus with diabetic neuropathy, unspecified: Secondary | ICD-10-CM

## 2017-05-19 DIAGNOSIS — I130 Hypertensive heart and chronic kidney disease with heart failure and stage 1 through stage 4 chronic kidney disease, or unspecified chronic kidney disease: Secondary | ICD-10-CM | POA: Diagnosis present

## 2017-05-19 DIAGNOSIS — R531 Weakness: Secondary | ICD-10-CM | POA: Diagnosis present

## 2017-05-19 DIAGNOSIS — M5116 Intervertebral disc disorders with radiculopathy, lumbar region: Secondary | ICD-10-CM | POA: Diagnosis present

## 2017-05-19 DIAGNOSIS — Z794 Long term (current) use of insulin: Secondary | ICD-10-CM | POA: Diagnosis not present

## 2017-05-19 DIAGNOSIS — H409 Unspecified glaucoma: Secondary | ICD-10-CM | POA: Diagnosis present

## 2017-05-19 DIAGNOSIS — Z91011 Allergy to milk products: Secondary | ICD-10-CM | POA: Diagnosis not present

## 2017-05-19 DIAGNOSIS — D631 Anemia in chronic kidney disease: Secondary | ICD-10-CM | POA: Diagnosis present

## 2017-05-19 DIAGNOSIS — I5030 Unspecified diastolic (congestive) heart failure: Secondary | ICD-10-CM

## 2017-05-19 DIAGNOSIS — L03115 Cellulitis of right lower limb: Secondary | ICD-10-CM

## 2017-05-19 DIAGNOSIS — L02415 Cutaneous abscess of right lower limb: Secondary | ICD-10-CM | POA: Diagnosis present

## 2017-05-19 DIAGNOSIS — G4733 Obstructive sleep apnea (adult) (pediatric): Secondary | ICD-10-CM | POA: Diagnosis present

## 2017-05-19 DIAGNOSIS — I5032 Chronic diastolic (congestive) heart failure: Secondary | ICD-10-CM | POA: Diagnosis present

## 2017-05-19 DIAGNOSIS — F319 Bipolar disorder, unspecified: Secondary | ICD-10-CM | POA: Diagnosis present

## 2017-05-19 DIAGNOSIS — Z89422 Acquired absence of other left toe(s): Secondary | ICD-10-CM | POA: Diagnosis not present

## 2017-05-19 DIAGNOSIS — E785 Hyperlipidemia, unspecified: Secondary | ICD-10-CM | POA: Diagnosis present

## 2017-05-19 DIAGNOSIS — Z833 Family history of diabetes mellitus: Secondary | ICD-10-CM | POA: Diagnosis not present

## 2017-05-19 DIAGNOSIS — I110 Hypertensive heart disease with heart failure: Secondary | ICD-10-CM

## 2017-05-19 DIAGNOSIS — F5104 Psychophysiologic insomnia: Secondary | ICD-10-CM

## 2017-05-19 DIAGNOSIS — Z89412 Acquired absence of left great toe: Secondary | ICD-10-CM | POA: Diagnosis not present

## 2017-05-19 DIAGNOSIS — D649 Anemia, unspecified: Secondary | ICD-10-CM | POA: Diagnosis present

## 2017-05-19 DIAGNOSIS — E11628 Type 2 diabetes mellitus with other skin complications: Secondary | ICD-10-CM | POA: Diagnosis present

## 2017-05-19 DIAGNOSIS — E1122 Type 2 diabetes mellitus with diabetic chronic kidney disease: Secondary | ICD-10-CM | POA: Diagnosis present

## 2017-05-19 DIAGNOSIS — I69312 Visuospatial deficit and spatial neglect following cerebral infarction: Secondary | ICD-10-CM | POA: Diagnosis not present

## 2017-05-19 DIAGNOSIS — R29898 Other symptoms and signs involving the musculoskeletal system: Secondary | ICD-10-CM | POA: Diagnosis present

## 2017-05-19 DIAGNOSIS — K219 Gastro-esophageal reflux disease without esophagitis: Secondary | ICD-10-CM | POA: Diagnosis present

## 2017-05-19 DIAGNOSIS — F418 Other specified anxiety disorders: Secondary | ICD-10-CM

## 2017-05-19 DIAGNOSIS — N183 Chronic kidney disease, stage 3 (moderate): Secondary | ICD-10-CM | POA: Diagnosis present

## 2017-05-19 DIAGNOSIS — I69392 Facial weakness following cerebral infarction: Secondary | ICD-10-CM | POA: Diagnosis not present

## 2017-05-19 LAB — BASIC METABOLIC PANEL
Anion gap: 5 (ref 5–15)
BUN: 24 mg/dL — ABNORMAL HIGH (ref 6–20)
CHLORIDE: 106 mmol/L (ref 101–111)
CO2: 29 mmol/L (ref 22–32)
CREATININE: 1.45 mg/dL — AB (ref 0.61–1.24)
Calcium: 8.5 mg/dL — ABNORMAL LOW (ref 8.9–10.3)
GFR calc non Af Amer: 56 mL/min — ABNORMAL LOW (ref >60)
Glucose, Bld: 293 mg/dL — ABNORMAL HIGH (ref 65–99)
POTASSIUM: 4.2 mmol/L (ref 3.5–5.1)
SODIUM: 140 mmol/L (ref 135–145)

## 2017-05-19 LAB — CBC
HEMATOCRIT: 32.9 % — AB (ref 39.0–52.0)
HEMOGLOBIN: 10.3 g/dL — AB (ref 13.0–17.0)
MCH: 28.7 pg (ref 26.0–34.0)
MCHC: 31.3 g/dL (ref 30.0–36.0)
MCV: 91.6 fL (ref 78.0–100.0)
Platelets: 220 10*3/uL (ref 150–400)
RBC: 3.59 MIL/uL — AB (ref 4.22–5.81)
RDW: 13 % (ref 11.5–15.5)
WBC: 6.6 10*3/uL (ref 4.0–10.5)

## 2017-05-19 LAB — GLUCOSE, CAPILLARY
GLUCOSE-CAPILLARY: 121 mg/dL — AB (ref 65–99)
GLUCOSE-CAPILLARY: 130 mg/dL — AB (ref 65–99)
Glucose-Capillary: 263 mg/dL — ABNORMAL HIGH (ref 65–99)
Glucose-Capillary: 156 mg/dL — ABNORMAL HIGH (ref 65–99)
Glucose-Capillary: 163 mg/dL — ABNORMAL HIGH (ref 65–99)

## 2017-05-19 MED ORDER — DEXTROSE 5 % IV SOLN
1.0000 g | INTRAVENOUS | Status: DC
  Administered 2017-05-19 – 2017-05-21 (×3): 1 g via INTRAVENOUS
  Filled 2017-05-19 (×4): qty 10

## 2017-05-19 MED FILL — FLUoxetine HCL 40 MG CAPS: 40 | 30 days supply | Qty: 30 | Fill #0

## 2017-05-19 MED FILL — CARVEDILOL 12.5 MG TABLET: 12.5 | 30 days supply | Qty: 60 | Fill #1

## 2017-05-19 MED FILL — glipiZIDE XL 10 MG TB24: 10 | 30 days supply | Qty: 60 | Fill #1

## 2017-05-19 MED FILL — ATORVASTATIN 80 MG TABLET: 80 | 30 days supply | Qty: 30 | Fill #2

## 2017-05-19 MED FILL — ?PANTOPRAZOLE SOD DR 40MG: 40 MG | 30 days supply | Qty: 60 | Fill #1

## 2017-05-19 MED FILL — ?ARIPIPRAZOLE 2MG TABLET: 2 | 30 days supply | Qty: 60 | Fill #0

## 2017-05-19 NOTE — Care Management Note (Signed)
Case Management Note  Patient Details  Name: Nyheem Binette MRN: [id number] Date of Birth: [birth year]  Subjective/Objective:  Right foot cellulitis, hx CHF, DM                 Action/Plan: Discharge Planning: NCM spoke to pt and states he goes to [facility]. He has an appt on 06/06/2017 at 2:45 pm. Plans to keep his appt. He is able to get medications from [facility]. Will continue to follow for dc needs.    PCP  Arnoldo Morale MD   Expected Discharge Date:                 Expected Discharge Plan:  Home/Self Care  In-House Referral:  NA  Discharge planning Services  CM Consult  Post Acute Care Choice:  NA Choice offered to:  NA  DME Arranged:  N/A DME Agency:  NA  HH Arranged:  NA HH Agency:  NA  Status of Service:  Completed, signed off  If discussed at [place] of Stay Meetings, dates discussed:    Additional Comments:  Erenest Rasher, RN 05/19/2017, 4:29 PM

## 2017-05-19 NOTE — Progress Notes (Signed)
Subjective: Mr. Blades was seen laying in his bed this morning. He states that he still has pain in his right dorsal foot. He has taken two doses of tramadol today. He denies any lightheadedness, sob, and chest pain.   Objective:  Vital signs in last 24 hours: Vitals:   05/18/17 1600 05/18/17 1715 05/18/17 2145 05/19/17 0552  BP: (!) 144/88 (!) 145/89 124/81 (!) 141/85  Pulse: 78 81 85 84  Resp:  16 17 16   Temp:  97.7 F (36.5 C) 98 F (36.7 C) 98.4 F (36.9 C)  TempSrc:  Oral Oral Oral  SpO2: 96% 97% 99% 94%  Weight:      Height:       Physical Exam  Constitutional: He appears well-developed and well-nourished. No distress.  HENT:  Head: Normocephalic and atraumatic.  Eyes: Conjunctivae are normal.  Cardiovascular: Normal rate, regular rhythm, normal heart sounds and intact distal pulses.  Pulmonary/Chest: Effort normal and breath sounds normal. No stridor. No respiratory distress.  Abdominal: Soft. Bowel sounds are normal. He exhibits no distension. There is no tenderness.  Neurological: He is alert. He displays abnormal reflex. A sensory deficit (distal right foot numbness on all sides (dorsal, plantar, medial, and lateral aspects)) is present.  No babinski present on right lower extremity, patellar reflex absent in right lower extremity. Rigid lower extremity muscle strength 2/5 intensity.   Left lower extremity remains 5/5 and bilateral upper extremity muscle strength 5/5  Skin: He is not diaphoretic.  7cm linear scar present on dorsal aspect of the right foot that is from prior surgery to remove abscess. The scar is slightly inflammed.   Psychiatric: He has a normal mood and affect. His behavior is normal. Judgment and thought content normal.     Assessment/Plan:  Dorsal right foot cellulitis The patient continues to state that he has right foot pain at the site of his presumed cellulitis based on soft tissue swelling noted on CT of right foot. He is afebrile and  does not have any leukocytosis (wbc=6.6). The patient does not have any erythema or discharge noted. The patient has significant risk factors causing poor vasculature such as diabetes and hypertension.    -stop zosyn -started IV ceftriaxone  -continue tramadol 50mg  q6hrs prn  Right lower extremity weakness The patient has noted a 3 day history of selective right lower extremity numbness. The patient is noted to have numbness and tingling likely due to diabetic neuropathy for which he uses gabapentin 300mg  bid. The patient denies hip pain or back pain.   The patient is unlikely to have stroke as he has numbness and weakness, but also has pain. He does not have any back pain making radiculopathy less likely. Patient is unlikely to have periodic paralysis as his potassium is normal at 4.2. The patient is most likely to have neurological deficits from radiculopathy, diabetic neuropathy, mononeuritis. If all else is excluded can think of conversion disorder. Will do hoover's test to check for this.  -Consulted neurology -Continue gabapentin 300mg  bid  Diastolic heart failure The patient appears to have normal EF and no diastolic dysfunction noted.   Diabetes mellitus The patient's blood glucose measurements have been ranging 118-296.   -Lantus 15 units twice daily -Continue glipizide 10 mg twice daily -Continue aspirin 81 mg daily -Continue atorvastatin 81 mg daily -SSI  Hypertension The patient blood pressure since admission has ranged 119-155/82-95.  -Continue carvedilol 12.5 mg twice daily, lisinopril 40 mg daily  Depression: -continue aripiprazole 4mg  qd  Dispo: Anticipated discharge in approximately 1-2 day(s).   Marland Kitchen

## 2017-05-19 NOTE — Plan of Care (Signed)
  Progressing Pain Managment: General experience of comfort will improve 05/19/2017 0602 - Progressing by [place], RN Safety: Ability to remain free from injury will improve 05/19/2017 0602 - Progressing by [place] Pugh, RN

## 2017-05-19 NOTE — Plan of Care (Signed)
  Progressing Education: Knowledge of General Education information will improve 05/19/2017 1135 - Progressing by Harlin Heys, RN Health Behavior/Discharge Planning: Ability to manage health-related needs will improve 05/19/2017 1135 - Progressing by Harlin Heys, RN Clinical Measurements: Ability to maintain clinical measurements within normal limits will improve 05/19/2017 1135 - Progressing by Harlin Heys, RN Will remain free from infection 05/19/2017 1135 - Progressing by Harlin Heys, RN Diagnostic test results will improve 05/19/2017 1135 - Progressing by Harlin Heys, RN Respiratory complications will improve 05/19/2017 1135 - Progressing by Harlin Heys, RN Cardiovascular complication will be avoided 05/19/2017 1135 - Progressing by Harlin Heys, RN Activity: Risk for activity intolerance will decrease 05/19/2017 1135 - Progressing by Harlin Heys, RN Nutrition: Adequate nutrition will be maintained 05/19/2017 1135 - Progressing by Harlin Heys, RN Coping: Level of anxiety will decrease 05/19/2017 1135 - Progressing by Harlin Heys, RN Elimination: Will not experience complications related to bowel motility 05/19/2017 1135 - Progressing by Harlin Heys, RN Will not experience complications related to urinary retention 05/19/2017 1135 - Progressing by Harlin Heys, RN Pain Managment: General experience of comfort will improve 05/19/2017 1135 - Progressing by Harlin Heys, RN Safety: Ability to remain free from injury will improve 05/19/2017 1135 - Progressing by Harlin Heys, RN Skin Integrity: Risk for impaired skin integrity will decrease 05/19/2017 1135 - Progressing by Harlin Heys, RN

## 2017-05-19 NOTE — Progress Notes (Signed)
Inpatient Diabetes Program Recommendations  AACE/ADA: New Consensus Statement on Inpatient Glycemic Control (2015)  Target Ranges:  Prepandial:   less than 140 mg/dL      Peak postprandial:   less than 180 mg/dL (1-2 hours)      Critically ill patients:  140 - 180 mg/dL   Lab Results  Component Value Date   GLUCAP 156 (H) 05/19/2017   HGBA1C 11.4 05/07/2017    Review of Glycemic ControlResults for TALLEN, SCHNORR (MRN [id number]) as of 05/19/2017 12:31  Ref. Range 05/18/2017 17:25 05/18/2017 21:36 05/18/2017 21:43 05/19/2017 06:51 05/19/2017 11:55  Glucose-Capillary Latest Ref Range: 65 - 99 mg/dL 121 (H) 226 (H) 216 (H) 263 (H) 156 (H)   Diabetes history: Type 2 DM Outpatient Diabetes medications: Lantus 30 units bid- Just restarted by MD on 11/21 due to elevated blood sugars Current orders for Inpatient glycemic control:  Novolog moderate tid with meals, Lantus 15 units bid, Glucotrol XL 10 mg bid  Inpatient Diabetes Program Recommendations:    May consider holding Glucotrol while patient is in the hospital.  May consider increasing Lantus to 20 units bid.  Will follow.  Thanks, Adah Perl, RN, BC-ADM Inpatient Diabetes Coordinator Pager [phone] (8a-5p)

## 2017-05-20 ENCOUNTER — Inpatient Hospital Stay (HOSPITAL_COMMUNITY): Payer: Medicaid Other

## 2017-05-20 DIAGNOSIS — R29898 Other symptoms and signs involving the musculoskeletal system: Secondary | ICD-10-CM

## 2017-05-20 DIAGNOSIS — M5126 Other intervertebral disc displacement, lumbar region: Secondary | ICD-10-CM

## 2017-05-20 DIAGNOSIS — L0390 Cellulitis, unspecified: Secondary | ICD-10-CM

## 2017-05-20 DIAGNOSIS — R197 Diarrhea, unspecified: Secondary | ICD-10-CM

## 2017-05-20 DIAGNOSIS — I10 Essential (primary) hypertension: Secondary | ICD-10-CM

## 2017-05-20 DIAGNOSIS — Z7982 Long term (current) use of aspirin: Secondary | ICD-10-CM

## 2017-05-20 LAB — CBC
HCT: 34.2 % — ABNORMAL LOW (ref 39.0–52.0)
Hemoglobin: 10.7 g/dL — ABNORMAL LOW (ref 13.0–17.0)
MCH: 28.8 pg (ref 26.0–34.0)
MCHC: 31.3 g/dL (ref 30.0–36.0)
MCV: 92.2 fL (ref 78.0–100.0)
PLATELETS: 233 10*3/uL (ref 150–400)
RBC: 3.71 MIL/uL — AB (ref 4.22–5.81)
RDW: 13.4 % (ref 11.5–15.5)
WBC: 7.8 10*3/uL (ref 4.0–10.5)

## 2017-05-20 LAB — BASIC METABOLIC PANEL
Anion gap: 8 (ref 5–15)
BUN: 21 mg/dL — AB (ref 6–20)
CALCIUM: 8.8 mg/dL — AB (ref 8.9–10.3)
CO2: 29 mmol/L (ref 22–32)
CREATININE: 1.45 mg/dL — AB (ref 0.61–1.24)
Chloride: 103 mmol/L (ref 101–111)
GFR calc Af Amer: >60 mL/min (ref >60)
GFR, EST NON AFRICAN AMERICAN: 56 mL/min — AB (ref >60)
GLUCOSE: 106 mg/dL — AB (ref 65–99)
Potassium: 4.2 mmol/L (ref 3.5–5.1)
SODIUM: 140 mmol/L (ref 135–145)

## 2017-05-20 LAB — GLUCOSE, CAPILLARY
GLUCOSE-CAPILLARY: 106 mg/dL — AB (ref 65–99)
Glucose-Capillary: 113 mg/dL — ABNORMAL HIGH (ref 65–99)
Glucose-Capillary: 223 mg/dL — ABNORMAL HIGH (ref 65–99)
Glucose-Capillary: 236 mg/dL — ABNORMAL HIGH (ref 65–99)

## 2017-05-20 MED ORDER — DICLOFENAC SODIUM 1 % TD GEL
2.0000 g | Freq: Four times a day (QID) | TRANSDERMAL | Status: DC | PRN
  Administered 2017-05-20 – 2017-05-21 (×2): 2 g via TOPICAL
  Filled 2017-05-20: qty 100

## 2017-05-20 MED FILL — TRUE METRIX TEST STRIP: 30 days supply | Qty: 100 | Fill #2

## 2017-05-20 NOTE — Evaluation (Signed)
Physical Therapy Evaluation Patient Details Name: Shane [name] MRN: [id number] DOB: [birth year] Today's Date: 05/20/2017   History of Present Illness  Shane [name] is an 46 y.o. male who presented with RLE foot pain and swelling. He was diagnosed with cellulitis of his right foot and started on IV Zosyn. On examination by admitting team, RLE weakness was noted that was disproportionate to any leg swelling and was not felt to be attributable to pain or infection. He describes a 4 day history of increased tingling in his lower extremities as well as new numbness of his right foot with complete sensory loss to the distal 1/3 of his foot including his toes. He endorses about 1-2 weeks of weakness involving his entire RLE. He denies RUE weakness, new right facial droop, dysphasia or confusion.   Clinical Impression  Patient received supine in bed, pleasant and willing to participate in skilled PT services; he confirms acute onset R LE weakness and numbness that is very concerning to him. Note severe weakness R LE per MMT, 2 to 2+/5 at best; L LE is [facility] for strength testing today, patient impaired to light touch testing R foot and LE. Able to complete functional transfers with min guard however fatigues very easily with gait with RW. Patient reports that he gives himself a grade of "D" and is very concerned about being able to go home safely, he feels quite far from his baseline. At this point recommend SNF care moving forward due to acute functional impairments effecting ability to safely return home, patient encouraged to express concerns about return home to MDs as well. Patient left in bed with RN present and attending to patient, RN aware of need for walker with mobility due to fatigue and fall risk.     Follow Up Recommendations SNF;Other (comment)(patient very concerned about being able to go home safely  due to new weakness R LE and balance; at bare minimum he will require HHPT )    Equipment  Recommendations  None recommended by PT    Recommendations for Other Services       Precautions / Restrictions Precautions Precautions: Fall Restrictions Weight Bearing Restrictions: No      Mobility  Bed Mobility Overal bed mobility: Needs Assistance Bed Mobility: Supine to Sit;Sit to Supine     Supine to sit: Supervision Sit to supine: Min assist   General bed mobility comments: to take weak R LE up into bed   Transfers Overall transfer level: Needs assistance Equipment used: Rolling walker (2 wheeled) Transfers: Sit to/from Stand Sit to Stand: Min guard            Ambulation/Gait Ambulation/Gait assistance: Min guard Ambulation Distance (Feet): 30 Feet Assistive device: Rolling walker (2 wheeled) Gait Pattern/deviations: Decreased step length - left;Decreased stance time - right;Decreased dorsiflexion - right;Decreased dorsiflexion - left;Trunk flexed     General Gait Details: limited by fatigue; note pre-existing transmetarsal amputation L foot  Stairs            Wheelchair Mobility    Modified Rankin (Stroke Patients Only)       Balance Overall balance assessment: Needs assistance Sitting-balance support: No upper extremity supported;Feet supported Sitting balance-Leahy Scale: Good     Standing balance support: Bilateral upper extremity supported Standing balance-Leahy Scale: Fair                               Pertinent Vitals/Pain Pain Assessment: 0-10 Pain Score:  10-Worst pain ever Pain Location: R foot  Pain Descriptors / Indicators: Sharp Pain Intervention(s): Limited activity within patient's tolerance;Monitored during session    [place] expects to be discharged to:: Private residence Living Arrangements: Spouse/significant other;Other (Comment)(62 year old grandchild ) Available Help at Discharge: Family;Available 24 hours/day Type of Home: Apartment Home Access: Level entry     Home Layout:  One level Home Equipment: Cane - single point;Walker - 4 wheels Additional Comments: patient reports no other equipment at home     Prior Function Level of Independence: Independent with assistive device(s)               Hand Dominance        Extremity/Trunk Assessment   Upper Extremity Assessment Upper Extremity Assessment: Overall WFL for tasks assessed    Lower Extremity Assessment Lower Extremity Assessment: RLE deficits/detail RLE Deficits / Details: R LE MMT generally 2 to 2+/5 at best; L LE appears generally WFL strength wise  RLE Sensation: decreased light touch    Cervical / Trunk Assessment Cervical / Trunk Assessment: Normal  Communication   Communication: No difficulties  Cognition Arousal/Alertness: Awake/alert Behavior During Therapy: WFL for tasks assessed/performed Overall Cognitive Status: Within Functional Limits for tasks assessed                                        General Comments General comments (skin integrity, edema, etc.): prior transmetatarsal amputation L foot; severe weakness R LE; limited by fatigue at baseline     Exercises     Assessment/Plan    PT Assessment Patient needs continued PT services  PT Problem List Decreased strength;Decreased mobility;Decreased coordination;Decreased activity tolerance;Decreased balance       PT Treatment Interventions DME instruction;Therapeutic activities;Gait training;Therapeutic exercise;Patient/family education;Balance training;Functional mobility training;Neuromuscular re-education    PT Goals (Current goals can be found in the Care Plan section)  Acute Rehab PT Goals Patient Stated Goal: to go home safely PT Goal Formulation: With patient Time For Goal Achievement: 06/03/17 Potential to Achieve Goals: Good    Frequency Min 3X/week   Barriers to discharge        Co-evaluation               AM-PAC PT "6 Clicks" Daily Activity  Outcome Measure Difficulty  turning over in bed (including adjusting bedclothes, sheets and blankets)?: Unable Difficulty moving from lying on back to sitting on the side of the bed? : Unable Difficulty sitting down on and standing up from a chair with arms (e.g., wheelchair, bedside commode, etc,.)?: Unable Help needed moving to and from a bed to chair (including a wheelchair)?: A Little Help needed walking in hospital room?: A Little Help needed climbing 3-5 steps with a railing? : A Lot 6 Click Score: 11    End of Session Equipment Utilized During Treatment: Gait belt Activity Tolerance: Patient limited by fatigue Patient left: in bed;with call bell/phone within reach;with nursing/sitter in room Nurse Communication: Mobility status;Other (comment)(need for RW with mobility due to fall risk ) PT Visit Diagnosis: Unsteadiness on feet (R26.81);Muscle weakness (generalized) (M62.81);Difficulty in walking, not elsewhere classified (R26.2)    Time: 0102-7253 PT Time Calculation (min) (ACUTE ONLY): 20 min   Charges:   PT Evaluation $PT Eval Low Complexity: 1 Low     PT G Codes:   PT G-Codes **NOT FOR INPATIENT CLASS** Functional Assessment Tool Used: AM-PAC 6 Clicks  Basic Mobility;Clinical judgement Functional Limitation: Mobility: Walking and moving around Mobility: Walking and Moving Around Current Status [phone]): At least 40 percent but less than 60 percent impaired, limited or restricted Mobility: Walking and Moving Around Goal Status [id number]): At least 20 percent but less than 40 percent impaired, limited or restricted    Deniece Ree PT, DPT, CBIS  Supplemental Physical Therapist [facility]

## 2017-05-20 NOTE — Progress Notes (Signed)
Subjective: No significant complaints today.  No change in symptoms.  Objective: Current vital signs: BP (!) 152/91 (BP Location: Left Arm)   Pulse 77   Temp 98 F (36.7 C) (Oral)   Resp 16   Ht 5\' 7"  (1.702 m)   Wt 87.5 kg (193 lb)   SpO2 96%   BMI 30.23 kg/m  Vital signs in last 24 hours: Temp:  [98 F (36.7 C)] 98 F (36.7 C) (12/04 0512) Pulse Rate:  [77-84] 77 (12/04 0512) Resp:  [16] 16 (12/04 0512) BP: (128-152)/(79-91) 152/91 (12/04 0512) SpO2:  [94 %-97 %] 96 % (12/04 0512)  Intake/Output from previous day: 12/03 0701 - 12/04 0700 In: -  Out: 550 [Urine:550] Intake/Output this shift: Total I/O In: 222 [P.O.:222] Out: -  Nutritional status: Diet Carb Modified Fluid consistency: Thin; Room service appropriate? Yes    General Examination:                                                                                                      HEENT-  /AT   Lungs- Respirations unlabored Extremities- Chronic trophic changes to skin of bilateral lower ext below knees. Amputation of distal left foot noted.   Neurological Examination--no significant change in physical exam today Mental Status: Alert, fully oriented, thought content appropriate.  Speech fluent without evidence of aphasia.  Able to follow all commands without difficulty. Cranial Nerves: --No significant change and physical exam II: Homonymous left superior quadrantanopsia. Crescentic visual field cut bilaterally involving temporal aspect of left lower quadrant of left eye and nasal aspect of LLQ of right eye. PERRL.  III,IV, VI: Saccadic visual pursuits noted, with full horizontal and vertical EOM.  V,VII: Mild left facial droop. Facial temp sensation intact bilaterally VIII: hearing intact to voice IX,X: No hypophonia XI: Symmetric XII: midline tongue extension Motor: RUE 5/5 proximal and distal  LUE 5/5 proximal and distal  RLE: 2/5 hip flexion--able to get the quadricep about 2 inches off the  bed. 3+/5 knee flexion and extension. 1-2/5 ankle dorsiflexion.  Plantar flexion 4+/5 LLE: 4+/5 proximal and distal Sensory: Normal FT and temp sensation bilateral upper ext.  Decreased temp sensation LLE in stocking distribution with proximal distal gradient. Decreased temp sensation RLE below knee, worse distally, with absent FT and temp sensation to distal last 1/3 of right foot including all toes. Absent proprioception to large toe of right foot.  Vibration sensation; Mild/subtle decrease to knuckles of hands bilaterally. Moderate decrease patellae; moderate to severe decrease ankles bilaterally; absent vibration sensation to toes of right foot.  Deep Tendon Reflexes: Symmetrically hypoactive throughout--did not elicit any knee jerk or ankle jerk Plantars: Right toe mute. Unable to test on left due to amputation of toes.  Cerebellar: No ataxia with FNF bilaterally. No ataxia with left H-S. Unable to perform H- S with RLE secondary to weakness.  Gait: Unable to assess     Lab Results: Basic Metabolic Panel: Recent Labs  Lab 05/18/17 1246 05/19/17 0532 05/20/17 0605  NA 137 140 140  K 4.5 4.2 4.2  CL 105 106  103  CO2 27 29 29   GLUCOSE 236* 293* 106*  BUN 24* 24* 21*  CREATININE 1.56* 1.45* 1.45*  CALCIUM 8.7* 8.5* 8.8*    Liver Function Tests: Recent Labs  Lab 05/18/17 1246  AST 25  ALT 31  ALKPHOS 76  BILITOT 0.4  PROT 6.4*  ALBUMIN 3.3*   No results for input(s): LIPASE, AMYLASE in the last 168 hours. No results for input(s): AMMONIA in the last 168 hours.  CBC: Recent Labs  Lab 05/18/17 1246 05/19/17 0532 05/20/17 0605  WBC 8.8 6.6 7.8  NEUTROABS 6.7  --   --   HGB 10.8* 10.3* 10.7*  HCT 34.8* 32.9* 34.2*  MCV 93.3 91.6 92.2  PLT 219 220 233    Cardiac Enzymes: No results for input(s): CKTOTAL, CKMB, CKMBINDEX, TROPONINI in the last 168 hours.  Lipid Panel: No results for input(s): CHOL, TRIG, HDL, CHOLHDL, VLDL, LDLCALC in the last 168  hours.  CBG: Recent Labs  Lab 05/19/17 0651 05/19/17 1155 05/19/17 1624 05/19/17 2107 05/20/17 0623  GLUCAP 263* 156* 163* 130* 106*    Microbiology: Results for orders placed or performed in visit on 11/12/16  Urine culture     Status: None   Collection Time: 11/12/16  3:57 PM  Result Value Ref Range Status   Urine Culture, Routine Final report  Final   Organism ID, Bacteria No growth  Final    Coagulation Studies: No results for input(s): LABPROT, INR in the last 72 hours.  Imaging: Mr Brain Wo Contrast  Result Date: 05/20/2017 CLINICAL DATA:  Stroke. Right-sided weakness with tingling and numbness EXAM: MRI HEAD WITHOUT CONTRAST TECHNIQUE: Multiplanar, multiecho pulse sequences of the brain and surrounding structures were obtained without intravenous contrast. COMPARISON:  MRI 03/08/2016 FINDINGS: Brain: Negative for acute infarct. Tiny hyperintensity left frontal white matter unchanged. Otherwise negative white matter. Brainstem and cerebellum normal. Basal ganglia negative. Negative for hemorrhage or mass. Vascular: Normal arterial flow voids. Skull and upper cervical spine: Negative Sinuses/Orbits: Mild mucosal edema paranasal sinuses.  Normal orbit Other: None IMPRESSION: Negative for acute infarct. Small hyperintensity left frontal white matter is nonspecific but unchanged from 2017. Possible chronic ischemia or migraine headache related. Electronically Signed   By: Franchot Gallo M.D.   On: 05/20/2017 09:55   Mr Lumbar Spine Wo Contrast  Result Date: 05/20/2017 CLINICAL DATA:  Radiculopathy on the right. Right lower extremity weakness, numbness, and tingling. EXAM: MRI LUMBAR SPINE WITHOUT CONTRAST TECHNIQUE: Multiplanar, multisequence MR imaging of the lumbar spine was performed. No intravenous contrast was administered. COMPARISON:  12/20/2015 FINDINGS: Segmentation:  Standard. Alignment:  Physiologic. Vertebrae: No fracture, evidence of discitis, or bone lesion. Stable  marrow signal Conus medullaris and cauda equina: Conus extends to the T12 level. Conus and cauda equina appear normal. Paraspinal and other soft tissues: Negative Disc levels: T12- L1: Unremarkable. L1-L2: Unremarkable. L2-L3: Unremarkable. L3-L4: Small left extraforaminal disc protrusion with left L3 nerve root contact. L4-L5: Disc narrowing and bulging. Right foraminal/extraforaminal protrusion contacting the right L4 nerve root that is chronic. Tiny central disc protrusion. Mild facet spurring. L5-S1:Unremarkable. IMPRESSION: 1. No acute finding. 2. 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 3. L3-4 small left extraforaminal protrusion contacting the L3 nerve root, contralateral to symptoms. 4. Diffusely patent spinal canal. Electronically Signed   By: Monte Fantasia M.D.   On: 05/20/2017 10:39   Ct Foot Right Wo Contrast  Result Date: 05/18/2017 CLINICAL DATA:  Right foot pain and swelling.  EXAM: CT OF THE RIGHT FOOT WITHOUT CONTRAST TECHNIQUE: Multidetector CT imaging of the right foot was performed according to the standard protocol. Multiplanar CT image reconstructions were also generated. COMPARISON:  Right foot x-rays dated September 17, 2016. FINDINGS: Bones/Joint/Cartilage No acute fracture or malalignment. No significant degenerative changes. No cortical destruction or osteolysis. No tibiotalar joint effusion. Bone mineralization is normal. Ligaments Suboptimally assessed by CT. Muscles and Tendons No focal abnormality.  No evidence of tenosynovitis. Soft tissues Prominent dorsal forefoot soft tissue swelling without drainable fluid collection. No subcutaneous emphysema. Atherosclerotic vascular calcifications. IMPRESSION: 1. Prominent dorsal forefoot soft tissue swelling as can be seen with cellulitis. No drainable fluid collection or subcutaneous emphysema. 2. No acute osseous abnormality.  No CT evidence of osteomyelitis. Electronically Signed    By: Titus Dubin M.D.   On: 05/18/2017 14:25    Medications:  Scheduled: . amLODipine  10 mg Oral Daily  . ARIPiprazole  4 mg Oral Daily  . aspirin EC  81 mg Oral Daily  . atorvastatin  80 mg Oral q1800  . carvedilol  12.5 mg Oral BID WC  . enoxaparin (LOVENOX) injection  40 mg Subcutaneous Q24H  . FLUoxetine  40 mg Oral Daily  . fluticasone  2 spray Each Nare Daily  . folic acid  1 mg Oral Daily  . gabapentin  300 mg Oral BID  . glipiZIDE  10 mg Oral BID WC  . insulin aspart  0-15 Units Subcutaneous TID WC  . insulin glargine  15 Units Subcutaneous BID  . lisinopril  40 mg Oral Daily  . pantoprazole  40 mg Oral BID AC   Continuous: . cefTRIAXone (ROCEPHIN)  IV Stopped (05/19/17 1530)   WIO:[id number] **OR** acetaminophen, diclofenac sodium, polyethylene glycol, senna-docusate, traMADol  Assessment/Plan: Weakness of right lower extremity.  MRI of lumbar spine does show L4-5 right foraminal and extraforaminal disc protrusion with L4 impingement-although this is been stable since July this could be producing some of his weakness distally however it would not explain the proximal hip weakness.  MRI brain shows no acute abnormalities  Agree with EMG nerve conduction study as an outpatient  Agree with physical therapy   Etta Quill PA-C [facility] [phone]  05/20/2017, 11:01 AM    NEUROHOSPITALIST ADDENDUM Seen and examined the patient this AM. Formulated plan as documented above. Recommendations as above.   Patient has only 2/5 strength in LE, involving both proximal and distal muscles. MRI L spine shows L4-L5 foraminal protrusion however would not cause the degree of weakness as well proximal RUE weakness at the hip.  No findings on MRI Brain to explain patient's symptoms. Pulses felt and leg was not cold, so ischemic limb unlikely as well.  Patient appeared to perform better when walking with PT therapist and I do feel there may be a degree of  embellishment of symptoms. Exam and history not really consistent with  polyradiculoneuropathy and therefore would not a candidate for IVIG.   Recommend PT/OT and outpatient neurology follow up with EMG/NCS. Unfortunately inpatient EMG/NCS not available. Please obtain arterial and venous doppler of leg although likely will be normal.  Neurology will sign off.     Karena Addison Aroor MD [facility] [phone]  If 7pm to 7am, please call on call as listed on AMION.

## 2017-05-20 NOTE — H&P (Signed)
NEURO HOSPITALIST CONSULT NOTE   Requestig physician: Dr. Heber Strawberry  Reason for Consult: RLE weakness  History obtained from:  Patient and Chart     HPI:                                                                                                                                          Shane [name] is an 46 y.o. male who presented with RLE foot pain and swelling. He was diagnosed with cellulitis of his right foot and started on IV Zosyn.   On examination by admitting team, RLE weakness was noted that was disproportionate to any leg swelling and was not felt to be attributable to pain or infection. He describes a 4 day history of increased tingling in his lower extremities as well as new numbness of his right foot with complete sensory loss to the distal 1/3 of his foot including his toes. He endorses about 1-2 weeks of weakness involving his entire RLE. He denies RUE weakness, new right facial droop, dysphasia or confusion.   PMHx includes prior stroke with residual left facial droop and left homonymous visual field deficit, HTN, DM2, HLD, OSA and chronic diastolic heart failure.   Past Medical History:  Diagnosis Date  . Anemia   . Bipolar disorder ([place])   . Cellulitis and abscess of left leg 06/2016  . Chest pain    a. 2015 Reportedly normal stress test in [place].  Marland Kitchen Chronic diastolic CHF (congestive heart failure) (H[facility])    a.03/2015 Echo: EF 55-60%, Gr 1 DD, mild MR, triv PR.  . Depression   . Depression with anxiety   . Dyspnea    with exertion  . GERD (gastroesophageal reflux disease)   . Glaucoma   . Hyperlipidemia   . Hypertension    a. 08/2014 Admitted with hypertensive urgency.  . Insomnia   . Internal carotid artery stenosis   . Lower GI bleed 07/04/2015  . Neuropathy   . Refusal of blood transfusions as patient is Jehovah's Witness   . TIA (transient ischemic attack) 08/2014; 03/2015   a. 08/2014 in setting of hypertensive urgency.  . Type II  diabetes mellitus ([place])    started insulin spring 2016, Type II  . Vitamin D deficiency spring 2016    Past Surgical History:  Procedure Laterality Date  . AMPUTATION Left 08/03/2015   Procedure: AMPUTATION LEFT GREAT TOE;  Surgeon: Newt Minion, MD;  Location: [facility];  Service: Orthopedics;  Laterality: Left;  . AMPUTATION Left 12/02/2015   Procedure: amputation of left 2nd digit  . AMPUTATION Left 04/10/2016   Procedure: Left 2nd Toe Amputation at MTP Joint;  Surgeon: Newt Minion, MD;  Location: [facility];  Service: Orthopedics;  Laterality: Left;  . AMPUTATION Left 06/09/2016  Procedure: AMPUTATION LEFT THIRD TOE;  Surgeon: [name], MD;  Location: [facility];  Service: Orthopedics;  Laterality: Left;  . AMPUTATION Left 06/26/2016   Procedure: Left Foot Transmetatarsal Amputation;  Surgeon: Newt Minion, MD;  Location: [facility];  Service: Orthopedics;  Laterality: Left;  . APPLICATION OF WOUND VAC Left 12/19/2015   Procedure: APPLICATION OF WOUND VAC;  Surgeon: Meredith Pel, MD;  Location: [facility];  Service: Orthopedics;  Laterality: Left;  . CIRCUMCISION    . COLONOSCOPY N/A 01/22/2016   Procedure: COLONOSCOPY;  Surgeon: Gatha Mayer, MD;  Location: [facility];  Service: Endoscopy;  Laterality: N/A;  . ESOPHAGOGASTRODUODENOSCOPY N/A 01/19/2016   Procedure: ESOPHAGOGASTRODUODENOSCOPY (EGD);  Surgeon: Manus Gunning, MD;  Location: [facility];  Service: Gastroenterology;  Laterality: N/A;  . ESOPHAGOGASTRODUODENOSCOPY N/A 01/22/2016   Procedure: ESOPHAGOGASTRODUODENOSCOPY (EGD);  Surgeon: Gatha Mayer, MD;  Location: [facility] ENDOSCOPY;  Service: Endoscopy;  Laterality: N/A;  . I&D EXTREMITY Left 12/02/2015   Procedure: IRRIGATION AND DEBRIDEMENT OF FOOT; LEFT SECOND TOE AMPUTATION;  Surgeon: Meredith Pel, MD;  Location: [facility];  Service: Orthopedics;  Laterality: Left;  . I&D EXTREMITY Left 12/19/2015   Procedure: I & D LEFT FOOT WITH BEADS ;  Surgeon: Meredith Pel, MD;   Location: [facility];  Service: Orthopedics;  Laterality: Left;  . I&D EXTREMITY Right 01/17/2016   Procedure: IRRIGATION AND DEBRIDEMENT RIGHT FOOT;  Surgeon: Newt Minion, MD;  Location: [facility];  Service: Orthopedics;  Laterality: Right;  . INCISION AND DRAINAGE FOOT Right 01/17/2016  . INGUINAL HERNIA REPAIR Bilateral ~ 1983- ~ 1986  . TONSILLECTOMY  ~ 35    Family History  Problem Relation Age of Onset  . Hypertension Mother   . Diabetes Mother   . Hyperlipidemia Mother   . Heart disease Mother        s/p pacemaker  . Diabetes Father   . Hypertension Father   . Stroke Father   . Heart attack Father        first MI @ 62.  . Depression Father   . Dementia Father   . Stroke Brother   . ADD / ADHD Brother   . Anxiety disorder Brother   . Bipolar disorder Brother   . OCD Brother   . Sexual abuse Brother     Social History:  reports that  has never smoked. he has never used smokeless tobacco. He reports that he does not drink alcohol or use drugs.  Allergies  Allergen Reactions  . Lactose Intolerance (Gi) Diarrhea and Other (See Comments)    Bloating (also)  . Other Other (See Comments)    Red meat causes stomach pains, bloating and diarrhea  . Milk-Related Compounds Diarrhea and Other (See Comments)    Any dairy products  - diarrhea and bloating    MEDICATIONS:  Prior to Admission:  Medications Prior to Admission  Medication Sig Dispense Refill Last Dose  . amLODipine (NORVASC) 10 MG tablet Take 1 tablet (10 mg total) by mouth daily. 30 tablet 11 05/18/2017 at Unknown time  . aspirin EC 81 MG tablet Take 1 tablet (81 mg total) by mouth daily. 30 tablet 11 05/18/2017 at Unknown time  . atorvastatin (LIPITOR) 80 MG tablet Take 1 tablet (80 mg total) by mouth every morning. 30 tablet 5 05/18/2017 at Unknown time  . carvedilol (COREG) 12.5 MG tablet TAKE 1 TABLET BY  MOUTH 2 TIMES DAILY WITH A MEAL (Patient taking differently: Take 12.5 mg by mouth 2 (two) times daily with a meal. ) 60 tablet 5 05/18/2017 at 0800  . fluticasone (FLONASE) 50 MCG/ACT nasal spray Place 2 sprays into both nostrils daily. 16 g 0 05/18/2017 at Unknown time  . folic acid (FOLVITE) 1 MG tablet Take 1 tablet (1 mg total) by mouth daily. 30 tablet 3 05/18/2017 at Unknown time  . gabapentin (NEURONTIN) 300 MG capsule Take 1 capsule (300 mg total) by mouth 2 (two) times daily. 60 capsule 5 05/18/2017 at 0800  . glipiZIDE (GLUCOTROL XL) 10 MG 24 hr tablet Take 2 tablets (20 mg total) by mouth daily with breakfast. (Patient taking differently: Take 10 mg by mouth 2 (two) times daily. ) 60 tablet 5 05/18/2017 at 0800  . ibuprofen (ADVIL,MOTRIN) 800 MG tablet TAKE 1 TABLET BY MOUTH EVERY 8 HOURS AS NEEDED. (Patient taking differently: TAKE 1 TABLET (800mg ) BY MOUTH EVERY 8 HOURS AS NEEDED FOR PAIN) 30 tablet 0 05/17/2017 at Unknown time  . Insulin Glargine (LANTUS SOLOSTAR) 100 UNIT/ML Solostar Pen Inject 30 Units into the skin 2 (two) times daily. 5 pen 3 05/18/2017 at 0800  . lisinopril (PRINIVIL,ZESTRIL) 40 MG tablet Take 1 tablet (40 mg total) by mouth daily. 30 tablet 5 05/18/2017 at Unknown time  . ondansetron (ZOFRAN ODT) 8 MG disintegrating tablet Take 1 tablet (8 mg total) by mouth every 8 (eight) hours as needed for nausea or vomiting. 30 tablet 0  at PRN  . pantoprazole (PROTONIX) 40 MG tablet Take 1 tablet (40 mg total) by mouth 2 (two) times daily before a meal. 60 tablet 3 05/18/2017 at 0800  . polyethylene glycol (MIRALAX / GLYCOLAX) packet Take 17 g by mouth daily as needed for moderate constipation. 14 each 0  at PRN  . senna-docusate (SENOKOT-S) 8.6-50 MG tablet Take 1 tablet by mouth at bedtime. (Patient taking differently: Take 1 tablet by mouth at bedtime as needed for mild constipation. ) 60 tablet 2  at PRN  . traZODone (DESYREL) 100 MG tablet TAKE 2 TABLETS (200 MG TOTAL) BY MOUTH AT  BEDTIME. 90 tablet 1 05/17/2017 at Unknown time  . Blood Glucose Monitoring Suppl (TRUE METRIX METER) DEVI 1 each by Does not apply route 3 (three) times daily. 1 Device 0 Taking  . Elastic Bandages & Supports (T.E.D. KNEE LENGTH/S-REGULAR) MISC Wear when awake 1 each 0 Taking  . glucose blood (ACCU-CHEK AVIVA PLUS) test strip Use as instructed 100 each 12 Taking  . glucose blood (TRUE METRIX BLOOD GLUCOSE TEST) test strip 1 each by Other route 3 (three) times daily. 100 each 12 Taking  . Insulin Pen Needle (B-D ULTRAFINE III SHORT PEN) 31G X 8 MM MISC 1 application by Does not apply route 3 (three) times daily. 90 each 11 Taking  . Lancet Devices (ACCU-CHEK SOFTCLIX) lancets Use as instructed 1 each 0 Taking  .  sucralfate (CARAFATE) 1 g tablet Take 1 tablet (1 g total) by mouth 4 (four) times daily -  with meals and at bedtime. (Patient not taking: Reported on 05/18/2017) 120 tablet 0 Not Taking at Unknown time  . TRUEPLUS LANCETS 28G MISC 1 each by Does not apply route 3 (three) times daily. 100 each 11 Taking  . Vitamin D, Ergocalciferol, (DRISDOL) 50000 units CAPS capsule Take 1 capsule (50,000 Units total) by mouth every 30 (thirty) days. Every 30 days (Patient not taking: Reported on 05/07/2017) 12 capsule 1 Completed Course at Unknown time   Scheduled: . amLODipine  10 mg Oral Daily  . ARIPiprazole  4 mg Oral Daily  . aspirin EC  81 mg Oral Daily  . atorvastatin  80 mg Oral q1800  . carvedilol  12.5 mg Oral BID WC  . enoxaparin (LOVENOX) injection  40 mg Subcutaneous Q24H  . FLUoxetine  40 mg Oral Daily  . fluticasone  2 spray Each Nare Daily  . folic acid  1 mg Oral Daily  . gabapentin  300 mg Oral BID  . glipiZIDE  10 mg Oral BID WC  . insulin aspart  0-15 Units Subcutaneous TID WC  . insulin glargine  15 Units Subcutaneous BID  . lisinopril  40 mg Oral Daily  . pantoprazole  40 mg Oral BID AC   Continuous: . cefTRIAXone (ROCEPHIN)  IV Stopped (05/19/17 1530)     ROS:                                                                                                                                        Endorses chronic unilateral visual impairment in both eyes secondary to prior stroke. Endorses chronic left facial droop. Other ROS as per HPI.   Blood pressure (!) 142/89, pulse 84, temperature 98 F (36.7 C), temperature source Oral, resp. rate 16, height 5\' 7"  (1.702 m), weight 87.5 kg (193 lb), SpO2 94 %.  General Examination:                                                                                                      HEENT-  [place]/AT   Lungs- Respirations unlabored Extremities- Chronic trophic changes to skin of bilateral lower ext below knees. Amputation of distal left foot noted.   Neurological Examination Mental Status: Alert, fully oriented, thought content appropriate.  Speech fluent without evidence of aphasia.  Able to follow all commands without difficulty. Cranial Nerves:  II: Homonymous left superior  quadrantanopsia. Crescentic visual field cut bilaterally involving temporal aspect of left lower quadrant of left eye and nasal aspect of LLQ of right eye. PERRL.  III,IV, VI: Saccadic visual pursuits noted, with full horizontal and vertical EOM.  V,VII: Mild left facial droop. Facial temp sensation intact bilaterally VIII: hearing intact to voice IX,X: No hypophonia XI: Symmetric XII: midline tongue extension Motor: RUE 5/5 proximal and distal  LUE 5/5 proximal and distal  RLE: 2/5 hip flexion. 3/5 knee flexion and extension. 1-2/5 ankle dorsiflexion.  LLE: 4+/5 proximal and distal Sensory: Normal FT and temp sensation bilateral upper ext.  Decreased temp sensation LLE in stocking distribution with proximal distal gradient. Decreased temp sensation RLE below knee, worse distally, with absent FT and temp sensation to distal last 1/3 of right foot including all toes. Absent proprioception to large toe of right foot.  Vibration sensation; Mild/subtle  decrease to knuckles of hands bilaterally. Moderate decrease patellae; moderate to severe decrease ankles bilaterally; absent vibration sensation to toes of right foot.  Deep Tendon Reflexes: Symmetrically hypoactive throughout Plantars: Right toe mute. Unable to test on left.  Cerebellar: No ataxia with FNF bilaterally. No ataxia with left H-S. Unable to perform H- S with RLE secondary to weakness.  Gait: Unable to assess  Lab Results: Basic Metabolic Panel: Recent Labs  Lab 05/18/17 1246 05/19/17 0532  NA 137 140  K 4.5 4.2  CL 105 106  CO2 27 29  GLUCOSE 236* 293*  BUN 24* 24*  CREATININE 1.56* 1.45*  CALCIUM 8.7* 8.5*    Liver Function Tests: Recent Labs  Lab 05/18/17 1246  AST 25  ALT 31  ALKPHOS 76  BILITOT 0.4  PROT 6.4*  ALBUMIN 3.3*   No results for input(s): LIPASE, AMYLASE in the last 168 hours. No results for input(s): AMMONIA in the last 168 hours.  CBC: Recent Labs  Lab 05/18/17 1246 05/19/17 0532  WBC 8.8 6.6  NEUTROABS 6.7  --   HGB 10.8* 10.3*  HCT 34.8* 32.9*  MCV 93.3 91.6  PLT 219 220    Cardiac Enzymes: No results for input(s): CKTOTAL, CKMB, CKMBINDEX, TROPONINI in the last 168 hours.  Lipid Panel: No results for input(s): CHOL, TRIG, HDL, CHOLHDL, VLDL, LDLCALC in the last 168 hours.  CBG: Recent Labs  Lab 05/18/17 2143 05/19/17 0651 05/19/17 1155 05/19/17 1624 05/19/17 2107  GLUCAP 216* 263* 156* 163* 130*    Microbiology: Results for orders placed or performed in visit on 11/12/16  Urine culture     Status: None   Collection Time: 11/12/16  3:57 PM  Result Value Ref Range Status   Urine Culture, Routine Final report  Final   Organism ID, Bacteria No growth  Final    Coagulation Studies: No results for input(s): LABPROT, INR in the last 72 hours.  Imaging: Ct Foot Right Wo Contrast  Result Date: 05/18/2017 CLINICAL DATA:  Right foot pain and swelling. EXAM: CT OF THE RIGHT FOOT WITHOUT CONTRAST TECHNIQUE:  Multidetector CT imaging of the right foot was performed according to the standard protocol. Multiplanar CT image reconstructions were also generated. COMPARISON:  Right foot x-rays dated September 17, 2016. FINDINGS: Bones/Joint/Cartilage No acute fracture or malalignment. No significant degenerative changes. No cortical destruction or osteolysis. No tibiotalar joint effusion. Bone mineralization is normal. Ligaments Suboptimally assessed by CT. Muscles and Tendons No focal abnormality.  No evidence of tenosynovitis. Soft tissues Prominent dorsal forefoot soft tissue swelling without drainable fluid collection. No subcutaneous emphysema. Atherosclerotic vascular calcifications.  IMPRESSION: 1. Prominent dorsal forefoot soft tissue swelling as can be seen with cellulitis. No drainable fluid collection or subcutaneous emphysema. 2. No acute osseous abnormality.  No CT evidence of osteomyelitis. Electronically Signed   By: Titus Dubin M.D.   On: 05/18/2017 14:25   Assessment: 46 year old male with new RLE weakness and sensory numbness 1. A component of the sensory numbness present on his examination most likely is secondary to diabetic neuropathy. The worsened sensory deficit may be due to the cellulitis. The RLE weakness, however is not attributable to neuropathy or the cellulitis. The weakness involves the proximal and distal RLE. DDx includes spinal stenosis with right worse than left nerve root impingements, lumbar plexopathy and left ACA stroke.  2. Prior history of stroke. Left superior quadrantanopsia and left facial droop are chronic sequelae of prior stroke, per patient.   Recommendations: 1. MRI of brain and lumbar spine. If unrevealing, the patient will likely need EMG/NCS, which can only be obtained as an outpatient.  2. Continue ASA and atorvastatin.  3. PT   Electronically signed: Dr. Kerney Elbe 05/20/2017, 1:32 AM

## 2017-05-20 NOTE — Progress Notes (Signed)
Subjective: Ms. Brookens was seen doing well this morning. He states that he had one episode of non-bloody diarrhea. His right lower extremity weakness has not changed. His right foot continues to be non-erythematous and without discharge.   Objective:  Vital signs in last 24 hours: Vitals:   05/19/17 1621 05/19/17 2100 05/20/17 0512 05/20/17 1300  BP: 128/79 (!) 142/89 (!) 152/91 (!) 142/81  Pulse: 79 84 77 79  Resp:  16 16 16   Temp: 98 F (36.7 C) 98 F (36.7 C) 98 F (36.7 C) 98.6 F (37 C)  TempSrc: Oral Oral Oral Oral  SpO2: 97% 94% 96% 94%  Weight:      Height:       Physical Exam  Constitutional: He appears well-developed and well-nourished. No distress.  HENT:  Head: Normocephalic and atraumatic.  Eyes: Conjunctivae are normal.  Cardiovascular: Normal rate, regular rhythm, normal heart sounds and intact distal pulses.  Respiratory: Effort normal and breath sounds normal. No respiratory distress. He has no wheezes.  GI: Soft. Bowel sounds are normal. He exhibits no distension. There is no tenderness.  Neurological: He is alert.  Wrist test negative.  Right lower extremity weakness 2/5 muscle strength.  Bilateral upper extremity strength 5/5.  Right lower extremity strength 5/5.  Sensation deficits in distal right foot.  Skin: He is not diaphoretic.  Psychiatric: He has a normal mood and affect. His behavior is normal. Judgment and thought content normal.    Assessment/Plan:  Dorsal right foot cellulitis The patient states that he noticed some white discharge from the dorsal aspect of his foot.  -Continue IV ceftriaxone and will continue cefalexin outpatient  -continue tramadol 50mg  q6hrs prn  Right lower extremity weakness Patient has sudden onset of right lower extremity weakness for the past 4 days.  MRI brain and MRI spine was done today. MRI brain was negative for any acute infarct, small hyperintensity of the left frontal white matter which is changed from  previous imaging in 2017.  MRI of the lumbar spine showed L4-L5 right foraminal and extraforaminal disc protrusion with L4 impingement.  L3-L4 small left extraforaminal protrusion contracting the L3 nerve root.    Spoke to neurosurgery Dr. Trenton Gammon who reviewed the patient's MRI of brain and MRI of the lumbar spine and stated that the findings were not consistent with physical exam.  Although there is some nerve impingement that was seen on imaging this is not likely to explain the right lower extremity weakness.  Diabetic neuropathy and cellulitis can help explain the patient's sensory deficits however they cannot explain the motor deficits.  Hoover test was done today and it was negative for any indication of conversion.  It is possible that the patient may also have extrapyramidal symptoms from his use of aripiprazole, but it is unlikely that dystonia-like symptoms can explain weakness in such a large muscle group.  -PT recommended snf.  Social work consult made. -EMG/NCS with outpatient neurology recommended -Consulted neurology -Continue gabapentin 300mg  bid  Diabetes mellitus The patient's blood glucose measurements have been ranging 106-223  -Lantus 15 units twice daily -Continue glipizide 10 mg twice daily -Continue aspirin 81 mgdaily -Continue atorvastatin 81 mg daily -SSI  Hypertension The patient blood pressure over the past 24 hours has ranged 140-150/70-80  -Continue carvedilol 12.5 mg twice daily, lisinopril 40 mg daily  Depression: -continue aripiprazole 4mg  qd    Dispo: Anticipated discharge in 1 day pending placement at skilled nursing facility.  Lars Mage, MD Internal Medicine PGY1  GNFAO:[phone] 05/20/2017, 1:58 PM

## 2017-05-21 ENCOUNTER — Encounter (HOSPITAL_COMMUNITY): Payer: Self-pay | Admitting: General Practice

## 2017-05-21 LAB — GLUCOSE, CAPILLARY
GLUCOSE-CAPILLARY: 137 mg/dL — AB (ref 65–99)
GLUCOSE-CAPILLARY: 232 mg/dL — AB (ref 65–99)
GLUCOSE-CAPILLARY: 189 mg/dL — AB (ref 65–99)
Glucose-Capillary: 167 mg/dL — ABNORMAL HIGH (ref 65–99)

## 2017-05-21 MED ORDER — INSULIN ASPART 100 UNIT/ML ~~LOC~~ SOLN
3.0000 [IU] | Freq: Three times a day (TID) | SUBCUTANEOUS | Status: DC
  Administered 2017-05-21 – 2017-05-22 (×3): 3 [IU] via SUBCUTANEOUS

## 2017-05-21 MED ORDER — INSULIN ASPART 100 UNIT/ML ~~LOC~~ SOLN
0.0000 [IU] | Freq: Three times a day (TID) | SUBCUTANEOUS | Status: DC
  Administered 2017-05-21: 2 [IU] via SUBCUTANEOUS
  Administered 2017-05-21: 5 [IU] via SUBCUTANEOUS

## 2017-05-21 MED ORDER — INSULIN ASPART 100 UNIT/ML ~~LOC~~ SOLN: 0.0000 [IU] | Freq: Three times a day (TID) | SUBCUTANEOUS | Status: DC

## 2017-05-21 MED ORDER — INSULIN ASPART 100 UNIT/ML ~~LOC~~ SOLN: 0.0000 [IU] | Freq: Every day | SUBCUTANEOUS | Status: DC

## 2017-05-21 NOTE — Social Work (Signed)
CSW received call from clinical team about patient placement. Pt is uninsured and CSW will have to find options for SNF placement and get approval for LOG SNF placement.  CSW will f/u.  Elissa Hefty, LCSW Clinical Social Worker [phone]

## 2017-05-21 NOTE — Clinical Social Work Note (Signed)
Clinical Social Work Assessment  Patient Details  Name: Shane [name] MRN: [id number] Date of Birth: July 05, 1970  Date of referral:  05/21/17               Reason for consult:  Facility Placement                Permission sought to share information with:  Chartered certified accountant granted to share information::  Yes, Verbal Permission Granted  Name::     [facility]  Agency::  SNF  Relationship::  spouse  Contact Information:     Housing/Transportation Living arrangements for the past 2 months:  Apartment Source of Information:  Patient Patient Interpreter Needed:  None Criminal Activity/Legal Involvement Pertinent to Current Situation/Hospitalization:  No - Comment as needed Significant Relationships:  Spouse, Adult Children, Other Family Members Lives with:  Spouse, Other (Comment)(granddaughter) Do you feel safe going back to the place where you live?  No Need for family participation in patient care:  No (Coment)  Care giving concerns:  Pt resides with spouse and granddaughter at home. Pt confirmed that he receives disability and does not work. Pt indicated that he had medicaid in the past and when he started receiving his disability income, medicaid was terminated as he did not meet income guidelines. Pt stated with ssdi, he will be eligible for insurance in 2019. Pt indicated that he has family support at home of spouse and granddaguhter and could return home if CSW cannot find him a SNF bed in the [place] area. Pt declined to go out of local area as it may be difficult for spouse to get to him.    SW will f/u as Dr. Maricela Bo called to discuss SNF placement and CSW discussed barriers.   Social Worker assessment / plan:  CSW will complete FL2 and fax out to see if SNF will offer a bed with LOG. CSW will f/u.  Employment status:  Disabled (Comment on whether or not currently receiving Disability) Insurance information:  Self Pay (Medicaid Pending) PT  Recommendations:  [place] / Referral to community resources:  [place]  Patient/Family's Response to care:  Patient appreciative of CSW assistance with SNF process. No issues or concerns identified.  Patient/Family's Understanding of and Emotional Response to Diagnosis, Current Treatment, and Prognosis:  Patient has good understanding of diagnosis, current treatment and prognosis as he is willing to go to SNF and understands that he may need additional support at home. He is understanding of his limitations and accepts that he may have to go home with home assistance. No other issues or concerns identified.  Emotional Assessment Appearance:  Appears stated age Attitude/Demeanor/Rapport:  (Cooperative) Affect (typically observed):  Accepting, Appropriate Orientation:  Oriented to Situation, Oriented to  Time, Oriented to Place, Oriented to Self Alcohol / Substance use:  Not Applicable Psych involvement (Current and /or in the community):  No (Comment)  Discharge Needs  Concerns to be addressed:  Discharge Planning Concerns Readmission within the last 30 days:  No Current discharge risk:  Dependent with Mobility, Physical Impairment Barriers to Discharge:  No Barriers Identified   Normajean Baxter, LCSW 05/21/2017, 9:03 AM

## 2017-05-21 NOTE — NC FL2 (Signed)
[place] LEVEL OF CARE SCREENING TOOL     IDENTIFICATION  Patient Name: Shane [name] Birthdate: [birth year] Sex: male Admission Date (Current Location): 05/18/2017  [facility] and [place] Number:  Herbalist and Address:  [facility], [place] [address], [place], [place] [address]      Provider Number: [id number]  Attending Physician Name and Address:  Lucious Groves, DO  Relative Name and Phone Number:  Corey Laski, [phone]    Current Level of Care: Hospital Recommended Level of Care: [place] Prior Approval Number:    Date Approved/Denied:   PASRR Number: pending  Discharge Plan: SNF    Current Diagnoses: Patient Active Problem List   Diagnosis Date Noted  . Weakness of right lower extremity 05/19/2017  . Cellulitis 05/18/2017  . Achilles tendon contracture, left 01/14/2017  . Gingivitis 11/14/2016  . Achilles tendon contracture, right 09/17/2016  . Pain and swelling of left lower leg 09/16/2016  . CKD (chronic kidney disease) stage 3, GFR 30-59 ml/min (HCC) 08/27/2016  . Dyslipidemia associated with type 2 diabetes mellitus ([place]) 07/22/2016  . Constipation 07/02/2016  . S/P transmetatarsal amputation of foot, left ([place]) 07/02/2016  . Dyspnea on exertion 07/02/2016  . Diabetic gastroparesis ([place]) 05/30/2016  . Buzzing in ear, right 03/26/2016  . Joint pain 03/11/2016  . Weakness 03/08/2016  . Dizziness 03/03/2016  . Chronic diastolic CHF (congestive heart failure), NYHA class 1 ([place]) 12/30/2015  . Diabetes mellitus type 2, uncontrolled, with complications ([place]) 28/78/6767  . Depression with anxiety 12/01/2015  . Pain in the chest 12/01/2015  . Insomnia 11/21/2015  . GERD (gastroesophageal reflux disease) 08/18/2015  . Erectile dysfunction 07/04/2015  . Symptomatic anemia 04/24/2015  . Left arm weakness 04/02/2015  . Sensory disturbance 04/02/2015  . Essential hypertension 04/02/2015  .  Hemispheric carotid artery syndrome   . HLD (hyperlipidemia)   . Headache     Orientation RESPIRATION BLADDER Height & Weight     Self, Time, Situation, Place  Normal Continent Weight: 241 lb 2.9 oz (109.4 kg) Height:  5\' 7"  (170.2 cm)  BEHAVIORAL SYMPTOMS/MOOD NEUROLOGICAL BOWEL NUTRITION STATUS      Continent Diet(See DC Summary)  AMBULATORY STATUS COMMUNICATION OF NEEDS Skin   Limited Assist Verbally Bruising                       Personal Care Assistance Level of Assistance  Dressing, Feeding, Bathing Bathing Assistance: Limited assistance Feeding assistance: Independent Dressing Assistance: Limited assistance     Functional Limitations Info  Sight, Hearing, Speech Sight Info: Adequate Hearing Info: Adequate      SPECIAL CARE FACTORS FREQUENCY  PT (By licensed PT)     PT Frequency: 5x week              Contractures Contractures Info: Not present    Additional Factors Info  Code Status, Allergies Code Status Info: Full Allergies Info: LACTOSE INTOLERANCE (GI), OTHER, MILK-RELATED COMPOUNDS           Current Medications (05/21/2017):  This is the current hospital active medication list Current Facility-Administered Medications  Medication Dose Route Frequency Provider Last Rate Last Dose  . acetaminophen (TYLENOL) tablet 650 mg  650 mg Oral Q6H PRN Alphonzo Grieve, MD       Or  . acetaminophen (TYLENOL) suppository 650 mg  650 mg Rectal Q6H PRN Alphonzo Grieve, MD      . amLODipine (NORVASC) tablet 10 mg  10 mg Oral Daily Alphonzo Grieve, MD   10 mg at 05/21/17 0919  . ARIPiprazole (ABILIFY) tablet 4 mg  4 mg Oral Daily Alphonzo Grieve, MD   4 mg at 05/21/17 9622  . aspirin EC tablet 81 mg  81 mg Oral Daily Alphonzo Grieve, MD   81 mg at 05/21/17 2979  . atorvastatin (LIPITOR) tablet 80 mg  80 mg Oral q1800 Alphonzo Grieve, MD   80 mg at 05/20/17 1641  . carvedilol (COREG) tablet 12.5 mg  12.5 mg Oral BID WC Alphonzo Grieve, MD   12.5 mg at 05/21/17  0919  . cefTRIAXone (ROCEPHIN) 1 g in dextrose 5 % 50 mL IVPB  1 g Intravenous Q24H Alphonzo Grieve, MD   Stopped at 05/20/17 1536  . diclofenac sodium (VOLTAREN) 1 % transdermal gel 2 g  2 g Topical Q6H PRN Chundi, Verne Spurr, MD   2 g at 05/20/17 2021  . enoxaparin (LOVENOX) injection 40 mg  40 mg Subcutaneous Q24H Alphonzo Grieve, MD   40 mg at 05/20/17 2022  . FLUoxetine (PROZAC) capsule 40 mg  40 mg Oral Daily Alphonzo Grieve, MD   40 mg at 05/21/17 0919  . fluticasone (FLONASE) 50 MCG/ACT nasal spray 2 spray  2 spray Each Nare Daily Alphonzo Grieve, MD   2 spray at 05/20/17 0828  . folic acid (FOLVITE) tablet 1 mg  1 mg Oral Daily Alphonzo Grieve, MD   1 mg at 05/21/17 0919  . gabapentin (NEURONTIN) capsule 300 mg  300 mg Oral BID Alphonzo Grieve, MD   300 mg at 05/21/17 0921  . glipiZIDE (GLUCOTROL XL) 24 hr tablet 10 mg  10 mg Oral BID WC Alphonzo Grieve, MD   10 mg at 05/21/17 0920  . insulin aspart (novoLOG) injection 0-15 Units  0-15 Units Subcutaneous TID WC Chundi, Vahini, MD      . insulin glargine (LANTUS) injection 15 Units  15 Units Subcutaneous BID Alphonzo Grieve, MD   15 Units at 05/21/17 0921  . lisinopril (PRINIVIL,ZESTRIL) tablet 40 mg  40 mg Oral Daily Alphonzo Grieve, MD   40 mg at 05/21/17 0919  . pantoprazole (PROTONIX) EC tablet 40 mg  40 mg Oral BID AC Alphonzo Grieve, MD   40 mg at 05/21/17 0920  . polyethylene glycol (MIRALAX / GLYCOLAX) packet 17 g  17 g Oral Daily PRN Alphonzo Grieve, MD      . senna-docusate (Senokot-S) tablet 1 tablet  1 tablet Oral QHS PRN Alphonzo Grieve, MD      . traMADol (ULTRAM) tablet 50 mg  50 mg Oral Q6H PRN Alphonzo Grieve, MD   50 mg at 05/20/17 2132     Discharge Medications: Please see discharge summary for a list of discharge medications.  Relevant Imaging Results:  Relevant Lab Results:   Additional Information SS#:[id number] Haskell, LCSW

## 2017-05-21 NOTE — Progress Notes (Signed)
   Subjective: Shane [name] was seen laying in his bed this morning doing well. He states that his foot pain is somewhat alleviated. He denies any diarrhea or shortness of breath today.   Objective:  Vital signs in last 24 hours: Vitals:   05/20/17 0512 05/20/17 1300 05/20/17 2124 05/21/17 0505  BP: (!) 152/91 (!) 142/81 (!) 138/91 (!) 146/98  Pulse: 77 79 81 89  Resp: 16 16 18 18   Temp: 98 F (36.7 C) 98.6 F (37 C) 98.1 F (36.7 C) 98.2 F (36.8 C)  TempSrc: Oral Oral Oral Oral  SpO2: 96% 94% 95% 98%  Weight:    241 lb 2.9 oz (109.4 kg)  Height:       Physical Exam  Constitutional: He appears well-developed and well-nourished.  HENT:  Head: Normocephalic and atraumatic.  Eyes: Conjunctivae are normal.  Cardiovascular: Normal rate, regular rhythm and normal heart sounds.  Respiratory: Effort normal and breath sounds normal. No respiratory distress. He has no wheezes.  GI: Soft. Bowel sounds are normal. He exhibits no distension. There is no tenderness.  Musculoskeletal: Normal range of motion.  Neurological:  Right lower extremity strength 3/5, bilateral upper extremity strength 5/5, distal medial and lateral sensation intact however dorsal and plantar sensation not intact   Psychiatric: He has a normal mood and affect. His behavior is normal. Judgment and thought content normal.    Assessment/Plan: Dorsal right foot cellulitis The patient states that he noticed some white discharge from the dorsal aspect of his foot.  -Continue IV ceftriaxone (12/3-present) and will continue cefalexin outpatient  -continuetramadol 50mg  q6hrs prn  Right lower extremity weakness Patient has sudden onset of right lower extremity weakness for past week. Imaging has not shown any findings that can explain the symptoms. Will need outpatient EMG/NCS and outpatient neurology for further workup. Diabetic neuropathy does not completely explain motor findings. Cellulitis and diabetic neuropathy  maybe contributing to the sensory component.   -outpatient neurology follow up -outpatient psychiatry follow up regarding aripiprazole and possible EPS -Social work working on placement -Continue gabapentin 300mg  bid  Diabetes mellitus The patient'sblood glucose measurements have been ranging 106-223.  The patient had elevated blood glucose readings of 223 at 18:00 and 236 at 21:00   -Lantus 15 units twice daily -Stopped glipizide 10 mg twice daily -Started aspart 3 units tidwc+ ssi -Continue aspirin 81 mgdaily -Continue atorvastatin 81 mg daily  Hypertension The patient blood pressure has been well controlled.  -Continue carvedilol 12.5 mg twice daily, lisinopril 40 mg daily  Depression: -continue aripiprazole 4mg  qd  -outpatient psychiatry follow up regarding aripiprazole and possible EPS  Dispo: Anticipated discharge in approximately 0-1 days.   Lars Mage, MD Internal Medicine PGY1 [phone] 05/21/2017, 6:52 AM

## 2017-05-21 NOTE — Social Work (Signed)
CSW obtained 30 day note signed by doctor.  CSW faxed requested documents to [employer] to obtain PASSR number.  CSW will continue to follow.  Elissa Hefty, LCSW Clinical Social Worker [phone]

## 2017-05-21 NOTE — Evaluation (Signed)
Occupational Therapy Evaluation Patient Details Name: Shane [name] MRN: [id number] DOB: [birth year] Today's Date: 05/21/2017    History of Present Illness Shane [name] is an 46 y.o. male who presented with RLE foot pain and swelling. He was diagnosed with cellulitis of his right foot and started on IV Zosyn. On examination by admitting team, RLE weakness was noted that was disproportionate to any leg swelling and was not felt to be attributable to pain or infection. He describes a 4 day history of increased tingling in his lower extremities as well as new numbness of his right foot with complete sensory loss to the distal 1/3 of his foot including his toes. He endorses about 1-2 weeks of weakness involving his entire RLE. He denies RUE weakness, new right facial droop, dysphasia or confusion.    Clinical Impression   Pt with decline in function and safety with ADLs and ADL mobility with decreased balance and endurance. Pt limited by pain in R foot/LE. Pt would benefit from acute OT services to address impairments to maximize level of function and safety    Follow Up Recommendations  SNF    Equipment Recommendations  Tub/shower seat    Recommendations for Other Services       Precautions / Restrictions        Mobility Bed Mobility Overal bed mobility: Needs Assistance Bed Mobility: Supine to Sit;Sit to Supine     Supine to sit: Supervision Sit to supine: Supervision      Transfers Overall transfer level: Needs assistance Equipment used: Rolling walker (2 wheeled) Transfers: Sit to/from Stand Sit to Stand: Min guard              Balance Overall balance assessment: Needs assistance Sitting-balance support: No upper extremity supported;Feet supported Sitting balance-Leahy Scale: Good     Standing balance support: Bilateral upper extremity supported;During functional activity;Single extremity supported Standing balance-Leahy Scale: Fair                             ADL either performed or assessed with clinical judgement   ADL Overall ADL's : Needs assistance/impaired                         Toilet Transfer: Nature conservation officer;Ambulation;Grab bars;RW   Toileting- Water quality scientist and Hygiene: Min guard;Sit to/from stand Toileting - Clothing Manipulation Details (indicate cue type and reason): pt using grab bars to steady self Tub/ Shower Transfer: Min guard;Ambulation;Rolling walker;3 in 1;Grab bars     General ADL Comments: min guard A with LB ADLs, pt reports that he steadies himself using wall at home during these tasks     Vision Baseline Vision/History: Wears glasses Wears Glasses: At all times Patient Visual Report: No change from baseline       Perception     Praxis      Pertinent Vitals/Pain Pain Assessment: 0-10 Pain Score: 5  Pain Location: R foot  Pain Descriptors / Indicators: Sharp Pain Intervention(s): Monitored during session;Premedicated before session;Repositioned;Relaxation     Hand Dominance Right   Extremity/Trunk Assessment Upper Extremity Assessment Upper Extremity Assessment: Overall WFL for tasks assessed   Lower Extremity Assessment Lower Extremity Assessment: Defer to PT evaluation   Cervical / Trunk Assessment Cervical / Trunk Assessment: Normal   Communication Communication Communication: No difficulties   Cognition Arousal/Alertness: Awake/alert Behavior During Therapy: WFL for tasks assessed/performed Overall Cognitive Status: Within Functional Limits for tasks assessed  General Comments   pt pleasant and cooperative    Exercises     Shoulder Instructions      Home Living Family/patient expects to be discharged to:: Private residence Living Arrangements: Spouse/significant other Available Help at Discharge: Family;Available 24 hours/day Type of Home: Apartment Home Access: Level entry     Home  Layout: One level     Bathroom Shower/Tub: Tub/shower unit;Curtain   Biochemist, clinical: Standard     Home Equipment: Cane - single point;Walker - 4 wheels   Additional Comments: patient reports no other equipment at home       Prior Functioning/Environment Level of Independence: Independent with assistive device(s)                 OT Problem List: Decreased activity tolerance;Pain;Impaired balance (sitting and/or standing);Decreased knowledge of use of DME or AE      OT Treatment/Interventions: Self-care/ADL training;DME and/or AE instruction;Therapeutic activities;Patient/family education    OT Goals(Current goals can be found in the care plan section) Acute Rehab OT Goals Patient Stated Goal: to go home safely OT Goal Formulation: With patient Time For Goal Achievement: 05/28/17 Potential to Achieve Goals: Good ADL Goals Pt Will Perform Grooming: with supervision;with set-up;standing;with caregiver independent in assisting Additional ADL Goal #1: pt will compeet LB bathing and dressing with sup and set up using A/E prn Additional ADL Goal #2: Pt will transfer to toilet and shower with sup  OT Frequency: Min 2X/week   Barriers to D/C:            Co-evaluation              AM-PAC PT "6 Clicks" Daily Activity     Outcome Measure Help from another person eating meals?: None Help from another person taking care of personal grooming?: A Little Help from another person toileting, which includes using toliet, bedpan, or urinal?: A Little Help from another person bathing (including washing, rinsing, drying)?: A Little Help from another person to put on and taking off regular upper body clothing?: A Little Help from another person to put on and taking off regular lower body clothing?: A Little 6 Click Score: 19   End of Session Equipment Utilized During Treatment: Rolling walker  Activity Tolerance: Patient tolerated treatment well Patient left: in bed;with call  bell/phone within reach  OT Visit Diagnosis: Unsteadiness on feet (R26.81);Other abnormalities of gait and mobility (R26.89);Pain                Time: 1000-1018 OT Time Calculation (min): 18 min Charges:  OT General Charges $OT Visit: 1 Visit OT Evaluation $OT Eval Low Complexity: 1 Low G-Codes: OT G-codes **NOT FOR INPATIENT CLASS** Functional Assessment Tool Used: AM-PAC 6 Clicks Daily Activity     [name] 05/21/2017, 1:54 PM

## 2017-05-22 DIAGNOSIS — K5229 Other allergic and dietetic gastroenteritis and colitis: Secondary | ICD-10-CM

## 2017-05-22 DIAGNOSIS — E119 Type 2 diabetes mellitus without complications: Secondary | ICD-10-CM

## 2017-05-22 DIAGNOSIS — Z91011 Allergy to milk products: Secondary | ICD-10-CM

## 2017-05-22 LAB — CBC
HEMATOCRIT: 34.8 % — AB (ref 39.0–52.0)
HEMOGLOBIN: 11 g/dL — AB (ref 13.0–17.0)
MCH: 28.9 pg (ref 26.0–34.0)
MCHC: 31.6 g/dL (ref 30.0–36.0)
MCV: 91.6 fL (ref 78.0–100.0)
Platelets: 243 10*3/uL (ref 150–400)
RBC: 3.8 MIL/uL — ABNORMAL LOW (ref 4.22–5.81)
RDW: 13.5 % (ref 11.5–15.5)
WBC: 7.7 10*3/uL (ref 4.0–10.5)

## 2017-05-22 LAB — GLUCOSE, CAPILLARY
Glucose-Capillary: 101 mg/dL — ABNORMAL HIGH (ref 65–99)
Glucose-Capillary: 97 mg/dL (ref 65–99)

## 2017-05-22 MED ORDER — CEPHALEXIN 500 MG PO CAPS: 500.0000 mg | ORAL_CAPSULE | Freq: Two times a day (BID) | ORAL | 0 refills | Status: AC

## 2017-05-22 MED ORDER — CEPHALEXIN 500 MG PO CAPS
500.0000 mg | ORAL_CAPSULE | Freq: Two times a day (BID) | ORAL | Status: DC
  Filled 2017-05-22: qty 1

## 2017-05-22 MED ORDER — CEPHALEXIN 500 MG PO CAPS: 500.0000 mg | ORAL_CAPSULE | Freq: Two times a day (BID) | ORAL | Status: DC

## 2017-05-22 MED FILL — CEPHALEXIN 500 MG CAPSULE: 500 | 2 days supply | Qty: 4 | Fill #0

## 2017-05-22 NOTE — Discharge Instructions (Signed)
It was a pleasure to take care of you [name].   -Please continue to take keflex for 2 days -Please look for any signs of redness, new pain, and discharge -Please follow up with your neurologist, primary care physician, and psychiatrist

## 2017-05-22 NOTE — Progress Notes (Signed)
   Subjective: Mr. Zerkle was seen resting in his bed this morning. He states that he is doing much better and that his sensory and motor deficits have improved. He states that he had 6 episodes of watery diarrhea yesterday and had another watery bowel movement this morning.  Objective:  Vital signs in last 24 hours: Vitals:   05/21/17 0505 05/21/17 1655 05/21/17 2134 05/22/17 0655  BP: (!) 146/98 (!) 143/86 133/73 (!) 140/92  Pulse: 89 82 83 82  Resp: 18   18  Temp: 98.2 F (36.8 C)  98.5 F (36.9 C) 98.3 F (36.8 C)  TempSrc: Oral  Oral Oral  SpO2: 98%  94% 95%  Weight: 241 lb 2.9 oz (109.4 kg)   237 lb 10.5 oz (107.8 kg)  Height:       Physical Exam  Constitutional: He appears well-developed and well-nourished. No distress.  HENT:  Head: Normocephalic and atraumatic.  Eyes: Conjunctivae are normal.  Cardiovascular: Normal rate, regular rhythm and normal heart sounds.  Respiratory: Effort normal and breath sounds normal. No respiratory distress. He has no wheezes.  GI: Soft. Bowel sounds are normal. He exhibits no distension. There is no tenderness.  Musculoskeletal: Normal range of motion.  Neurological: He is alert.  Right lower extremity weakness has improved to 4/5 muscle strength. Left lower extremity muscle strength 5/5. Bilateral muscle strength 5/5. No sensation deficits present in right foot today.   Skin: He is not diaphoretic.  Psychiatric: He has a normal mood and affect. His behavior is normal. Judgment and thought content normal.    Assessment/Plan:  Dorsal right foot cellulitis The patient states that he noticed some white discharge from the dorsal aspect of his foot.  -StoppedIV ceftriaxone (12/3-12/6)  -Started Cefalexin 500mg  q12hrs (12/6-127) for total 5 day antibiotic therapy duration -continuetramadol 50mg  q6hrs prn  Right lower extremity weakness Patient's weakness has improved. Will request PT/OT to re-evaluate necessity for SNF and discharge  home today if appropriate.  -Continue gabapentin 300mg  bid  Diabetes mellitus The patient'sblood glucose measurements appear to be better controlled with change in insulin regimen yesterday. The blood glucose measurements have ranged 101-189.  -Lantus 15 units twice daily -Continue glipizide 10 mg twice daily -Continue aspart 3 units tidwc+ ssi -Continue aspirin 81 mgdaily -Continue atorvastatin 81 mg daily  Hypertension The patient blood pressure has been well controlled ranging 133-143/73-86.  -Continue carvedilol 12.5 mg twice daily, lisinopril 40 mg daily  Depression: -continue aripiprazole 4mg  qd -outpatient psychiatry follow up regarding aripiprazole and possible EPS    Dispo: Anticipated discharge in approximately 0-1 day.   Lars Mage, MD Internal Medicine PGY1 Pager:[phone] 05/22/2017, 9:10 AM

## 2017-05-22 NOTE — Discharge Summary (Signed)
Name: Shane [name] MRN: [id number] DOB: 08-09-1970 46 y.o. PCP: Arnoldo Morale, MD  Date of Admission: 05/18/2017  1:45 PM Date of Discharge: 05/22/2017 Attending Physician: Dr. Joni Reining  Discharge Diagnosis:  Principal Problem: Dorsal Right Foot Cellulitis Right lower extremity weakness   Discharge Medications: Allergies as of 05/22/2017      Reactions   Lactose Intolerance (gi) Diarrhea, Other (See Comments)   Bloating (also)   Other Other (See Comments)   Red meat causes stomach pains, bloating and diarrhea   Milk-related Compounds Diarrhea, Other (See Comments)   Any dairy products  - diarrhea and bloating      Medication List    TAKE these medications   accu-chek softclix lancets Use as instructed   amLODipine 10 MG tablet Commonly known as:  NORVASC Take 1 tablet (10 mg total) by mouth daily.   ARIPiprazole 2 MG tablet Commonly known as:  ABILIFY TAKE 2 TABLETS (4 MG TOTAL) BY MOUTH DAILY.   aspirin EC 81 MG tablet Take 1 tablet (81 mg total) by mouth daily.   atorvastatin 80 MG tablet Commonly known as:  LIPITOR Take 1 tablet (80 mg total) by mouth every morning.   carvedilol 12.5 MG tablet Commonly known as:  COREG TAKE 1 TABLET BY MOUTH 2 TIMES DAILY WITH A MEAL What changed:    how much to take  how to take this  when to take this  additional instructions   cephALEXin 500 MG capsule Commonly known as:  KEFLEX Take 1 capsule (500 mg total) by mouth every 12 (twelve) hours for 2 days.   FLUoxetine 40 MG capsule Commonly known as:  PROZAC TAKE 1 CAPSULE (40 MG TOTAL) BY MOUTH DAILY.   fluticasone 50 MCG/ACT nasal spray Commonly known as:  FLONASE Place 2 sprays into both nostrils daily.   folic acid 1 MG tablet Commonly known as:  FOLVITE Take 1 tablet (1 mg total) by mouth daily.   gabapentin 300 MG capsule Commonly known as:  NEURONTIN Take 1 capsule (300 mg total) by mouth 2 (two) times daily.   glipiZIDE 10 MG 24 hr  tablet Commonly known as:  GLUCOTROL XL Take 2 tablets (20 mg total) by mouth daily with breakfast. What changed:    how much to take  when to take this   glucose blood test strip Commonly known as:  ACCU-CHEK AVIVA PLUS Use as instructed   glucose blood test strip Commonly known as:  TRUE METRIX BLOOD GLUCOSE TEST 1 each by Other route 3 (three) times daily.   ibuprofen 800 MG tablet Commonly known as:  ADVIL,MOTRIN TAKE 1 TABLET BY MOUTH EVERY 8 HOURS AS NEEDED. What changed:    how much to take  how to take this  when to take this   Insulin Glargine 100 UNIT/ML Solostar Pen Commonly known as:  LANTUS SOLOSTAR Inject 30 Units into the skin 2 (two) times daily.   Insulin Pen Needle 31G X 8 MM Misc Commonly known as:  B-D ULTRAFINE III SHORT PEN 1 application by Does not apply route 3 (three) times daily.   lisinopril 40 MG tablet Commonly known as:  PRINIVIL,ZESTRIL Take 1 tablet (40 mg total) by mouth daily.   ondansetron 8 MG disintegrating tablet Commonly known as:  ZOFRAN ODT Take 1 tablet (8 mg total) by mouth every 8 (eight) hours as needed for nausea or vomiting.   pantoprazole 40 MG tablet Commonly known as:  PROTONIX Take 1 tablet (40 mg total) by  mouth 2 (two) times daily before a meal.   polyethylene glycol packet Commonly known as:  MIRALAX / GLYCOLAX Take 17 g by mouth daily as needed for moderate constipation.   senna-docusate 8.6-50 MG tablet Commonly known as:  Senokot-S Take 1 tablet by mouth at bedtime. What changed:    when to take this  reasons to take this   sucralfate 1 g tablet Commonly known as:  CARAFATE Take 1 tablet (1 g total) by mouth 4 (four) times daily -  with meals and at bedtime.   T.E.D. KNEE LENGTH/S-REGULAR Misc Wear when awake   traZODone 100 MG tablet Commonly known as:  DESYREL TAKE 2 TABLETS (200 MG TOTAL) BY MOUTH AT BEDTIME.   TRUE METRIX METER Devi 1 each by Does not apply route 3 (three) times  daily.   TRUEPLUS LANCETS 28G Misc 1 each by Does not apply route 3 (three) times daily.   Vitamin D (Ergocalciferol) 50000 units Caps capsule Commonly known as:  DRISDOL Take 1 capsule (50,000 Units total) by mouth every 30 (thirty) days. Every 30 days       Disposition and follow-up:   Mr.Aden Kolbe was discharged from [facility] in good condition.  At the hospital follow up visit please address:  1.  Cellulitis-monitor site of cellulitis to ensure there   Right lower extremity weakness: follow up with neurology and do EMG/NCS to determine reason for weakness.    2.  Labs / imaging needed at time of follow-up: none  3.  Pending labs/ test needing follow-up: none  Follow-up Appointments: Follow-up Information    community Follow up.        [place] Follow up.   Why:  Keep your scheduled appointment for 06/06/17 at 2:45pm Contact information: [place] [phone]          Hospital Course by problem list:  Dorsal right foot cellulitis:  The patient presented with a one day history of right foot swelling, erythema, and pain. CT of the right foot did not show any evidence for osteomyelitis but there was some soft tissue swelling noted. The patient likely has cellulitis on the dorsal aspect of his right foot. The patient was afebrile on admission and white blood cell count was normal at 8.8. The patient was given zosyn for 2 days, ceftriaxone for 3 days, and cefalexin for 2 days. The patient's foot looked well on day of discharge with out any erythema or discharge.   Right lower extremity weakness: The patient has noted a 3 day history of right lower extremity numbness in dorsal, plantar, medial and lateral sides of the distal right foot. The patient also had 2/5 right lower extremity muscular weakness form the right foot up till the right knee. The patient was evaluated for  a stroke, but imaging was negative for any stroke findings. Neurology was consulted and they suggested that the patient may have possible radiculopathy, diabetic neuropathy, and mononeuritis. The patient did not have a positive hoover's test when testing for conversion disorder.   On the day of discharge the patient had regained his sensation and most of his muscular weakness was regained. The patient had 4/5 muscle strength in the right lower extremity. Physical therapy felt that the patient was good to be discharged home and he was recommended to do exercises at home.  Although the patient's right lower extremity weakness was not know his weakness subsided. He should  get a EMG/NCS outpatient.    Discharge Vitals:   BP 140/80 (BP Location: Left Arm)   Pulse 90   Temp 98.4 F (36.9 C) (Oral)   Resp 18   Ht 5\' 7"  (1.702 m)   Wt 237 lb 10.5 oz (107.8 kg)   SpO2 100%   BMI 37.22 kg/m   Pertinent Labs, Studies, and Procedures:  CMP     Component Value Date/Time   NA 140 05/20/2017 0605   NA 146 (H) 05/12/2017 1003   K 4.2 05/20/2017 0605   CL 103 05/20/2017 0605   CO2 29 05/20/2017 0605   GLUCOSE 106 (H) 05/20/2017 0605   BUN 21 (H) 05/20/2017 0605   BUN 16 05/12/2017 1003   CREATININE 1.45 (H) 05/20/2017 0605   CREATININE 1.79 (H) 08/02/2016 1409   CALCIUM 8.8 (L) 05/20/2017 0605   PROT 6.4 (L) 05/18/2017 1246   PROT 7.4 05/12/2017 1003   ALBUMIN 3.3 (L) 05/18/2017 1246   ALBUMIN 4.2 05/12/2017 1003   AST 25 05/18/2017 1246   ALT 31 05/18/2017 1246   ALKPHOS 76 05/18/2017 1246   BILITOT 0.4 05/18/2017 1246   BILITOT <0.2 05/12/2017 1003   GFRNONAA 56 (L) 05/20/2017 0605   GFRNONAA 44 (L) 08/02/2016 1409   GFRAA >60 05/20/2017 0605   GFRAA 51 (L) 08/02/2016 1409   BMP Latest Ref Rng & Units 05/20/2017 05/19/2017 05/18/2017  Glucose 65 - 99 mg/dL 106(H) 293(H) 236(H)  BUN 6 - 20 mg/dL 21(H) 24(H) 24(H)  Creatinine 0.61 - 1.24 mg/dL 1.45(H) 1.45(H) 1.56(H)  BUN/Creat Ratio 9 -  20 - - -  Sodium 135 - 145 mmol/L 140 140 137  Potassium 3.5 - 5.1 mmol/L 4.2 4.2 4.5  Chloride 101 - 111 mmol/L 103 106 105  CO2 22 - 32 mmol/L 29 29 27   Calcium 8.9 - 10.3 mg/dL 8.8(L) 8.5(L) 8.7(L)   CT right foot without contrast (05/18/17): No evidence of osteomyelitis, no drainable fluid collection or subcutaneous emphysema, soft tissue swelling in the dorsal aspect of the right foot present.  MRI Brain (05/20/17): Negative for acute infarct. Small hyperintensity left frontal white matter is nonspecific but unchanged from 2017. Possible chronic ischemia or migraine headache related  MRI Lumbar spine (05/20/17): 1. No acute finding. 2. 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 3. L3-4 small left extraforaminal protrusion contacting the L3 nerve root, contralateral to symptoms. 4. Diffusely patent spinal canal.   Discharge Instructions: Discharge Instructions    Call MD for:  extreme fatigue   Complete by:  As directed    Call MD for:  persistant dizziness or light-headedness   Complete by:  As directed    Call MD for:  persistant nausea and vomiting   Complete by:  As directed    Call MD for:  redness, tenderness, or signs of infection (pain, swelling, redness, odor or green/yellow discharge around incision site)   Complete by:  As directed    Call MD for:  severe uncontrolled pain   Complete by:  As directed    Diet - low sodium heart healthy   Complete by:  As directed    Face-to-face encounter (required for Medicare/Medicaid patients)   Complete by:  As directed    I Safi Culotta certify that this patient is under my care and that I, or a nurse practitioner or physician's assistant working with me, had a face-to-face encounter that meets the physician face-to-face encounter requirements with this patient  on 05/22/2017. The encounter with the patient was in whole, or in part for the following medical condition(s)  which is the primary reason for home health care (List medical condition): Right lower extremity weakness   The encounter with the patient was in whole, or in part, for the following medical condition, which is the primary reason for home health care:  Right lower extremity weakness   I certify that, based on my findings, the following services are medically necessary home health services:  Physical therapy   Reason for Medically Necessary Home Health Services:  Therapy- Therapeutic Exercises to Increase Strength and Endurance   My clinical findings support the need for the above services:  Unsafe ambulation due to balance issues   Further, I certify that my clinical findings support that this patient is homebound due to:  Unsafe ambulation due to balance issues   Home Health   Complete by:  As directed    To provide the following care/treatments:  PT   Increase activity slowly   Complete by:  As directed       Signed: Lars Mage, MD Internal Medicine PGY1 Pager:[phone]

## 2017-05-22 NOTE — Plan of Care (Signed)
  Progressing Nutrition: Adequate nutrition will be maintained 05/22/2017 0111 - Progressing by West Pugh, RN Coping: Level of anxiety will decrease 05/22/2017 0111 - Progressing by West Pugh, RN Pain Managment: General experience of comfort will improve 05/22/2017 0111 - Progressing by West Pugh, RN Safety: Ability to remain free from injury will improve 05/22/2017 0111 - Progressing by West Pugh, RN

## 2017-05-22 NOTE — Progress Notes (Signed)
Physical Therapy Treatment Patient Details Name: Shane [name] MRN: [id number] DOB: 12-11-1970 Today's Date: 05/22/2017    History of Present Illness 46 yo male who presented with R LE pain and edema, dx with cellulits and put on IV Zosyn. Noted weakness and sensation loss R LE acutely. PMH bipolar, depression, HTN, neuropathy, TIA, hx L transmetarsal amputation     PT Comments    Patient received in bed, pleasant and reporting he is feeling much better, he has been able to get to the bathroom with his cane without issues now. MMT retesting reveals general grades of 4- to 4/5 R LE grossly, patient also able to ambulate with S and SPC in hospital room without fall risk or safety concerns. Patient appears to be returning to baseline; assigned HEP exercises to assist in further promoting strength return in R LE. MD present at end of session and PT discussed patient's ability to return home safely at this point as well as general patient status. Patient is appropriate for safe return home from PT perspective but may benefit from HHPT if able to obtain. Patient left in room with MD team attending to patient.     Follow Up Recommendations  Home health PT     Equipment Recommendations  None recommended by PT    Recommendations for Other Services       Precautions / Restrictions Precautions Precautions: Fall;Other (comment) Precaution Comments: enteric precautions  Restrictions Weight Bearing Restrictions: No    Mobility  Bed Mobility Overal bed mobility: Modified Independent                Transfers Overall transfer level: Modified independent Equipment used: Straight cane Transfers: Sit to/from Stand              Ambulation/Gait Ambulation/Gait assistance: Supervision 65ft SPC      Gait Pattern/deviations: Step-through pattern;Decreased step length - left;Decreased stance time - right;Decreased weight shift to right;Trunk flexed         Stairs             Wheelchair Mobility    Modified Rankin (Stroke Patients Only)       Balance Overall balance assessment: No apparent balance deficits (not formally assessed) Sitting-balance support: No upper extremity supported Sitting balance-Leahy Scale: Good     Standing balance support: Single extremity supported Standing balance-Leahy Scale: Good                              Cognition Arousal/Alertness: Awake/alert Behavior During Therapy: WFL for tasks assessed/performed Overall Cognitive Status: Within Functional Limits for tasks assessed                                        Exercises      General Comments        Pertinent Vitals/Pain Pain Assessment: No/denies pain    Home Living                      Prior Function            PT Goals (current goals can now be found in the care plan section) Acute Rehab PT Goals Patient Stated Goal: to go home safely PT Goal Formulation: With patient Time For Goal Achievement: 06/03/17 Potential to Achieve Goals: Good Progress towards PT goals: Progressing toward goals  Frequency    Min 2X/week      PT Plan Discharge plan needs to be updated    Co-evaluation              AM-PAC PT "6 Clicks" Daily Activity  Outcome Measure  Difficulty turning over in bed (including adjusting bedclothes, sheets and blankets)?: None Difficulty moving from lying on back to sitting on the side of the bed? : None Difficulty sitting down on and standing up from a chair with arms (e.g., wheelchair, bedside commode, etc,.)?: None Help needed moving to and from a bed to chair (including a wheelchair)?: None Help needed walking in hospital room?: None Help needed climbing 3-5 steps with a railing? : A Little 6 Click Score: 23    End of Session Equipment Utilized During Treatment: Gait belt Activity Tolerance: Patient tolerated treatment well Patient left: in bed;Other (comment)(MD attending  ) Nurse Communication: Other (comment)(MD aware of patient recommendations ) PT Visit Diagnosis: Muscle weakness (generalized) (M62.81)     Time: 5009-3818 PT Time Calculation (min) (ACUTE ONLY): 26 min  Charges:  $Gait Training: 8-22 mins $Self Care/Home Management: 8-22                    G Codes:       Deniece Ree PT, DPT, CBIS  Supplemental Physical Therapist [place]

## 2017-05-26 ENCOUNTER — Other Ambulatory Visit (HOSPITAL_COMMUNITY): Payer: Self-pay | Admitting: Psychiatry

## 2017-05-26 DIAGNOSIS — F5104 Psychophysiologic insomnia: Secondary | ICD-10-CM

## 2017-05-26 MED FILL — traZODone HCL 100 MG TABS: 100 | 30 days supply | Qty: 60 | Fill #2

## 2017-05-29 ENCOUNTER — Telehealth: Payer: Self-pay | Admitting: Family Medicine

## 2017-05-29 MED FILL — TRUEPLUS PEN NDL 31GX5/16: 31G X 8 MM | 33 days supply | Qty: 100 | Fill #1

## 2017-05-29 MED FILL — TRUEPLUS PEN NDL 31GX5/16": 31G X 8 MM | 33 days supply | Qty: 100 | Fill #1

## 2017-05-29 NOTE — Telephone Encounter (Signed)
Pt was called and pt states that he will not be able to make upcoming appointment due to being out of town. Pt states that he is still concerned about weight gain and would like some type of medication sent to his pharmacy since he wont be able to make appointment to discuss medication.

## 2017-05-29 NOTE — Telephone Encounter (Signed)
Pt called to request medication advice (He was just released from the ED.

## 2017-05-30 NOTE — Telephone Encounter (Signed)
Pt was called and informed that an OV is needed for medication.

## 2017-05-30 NOTE — Telephone Encounter (Signed)
He can reschedule when he is back in town. That will still need to be discussed at an OV. Thanks

## 2017-06-02 ENCOUNTER — Ambulatory Visit (HOSPITAL_COMMUNITY): Payer: Self-pay | Admitting: Licensed Clinical Social Worker

## 2017-06-06 ENCOUNTER — Ambulatory Visit: Payer: Self-pay | Admitting: Family Medicine

## 2017-06-16 ENCOUNTER — Ambulatory Visit (HOSPITAL_COMMUNITY): Payer: Self-pay | Admitting: Licensed Clinical Social Worker

## 2017-06-26 MED FILL — ATORVASTATIN 80 MG TABLET: 80 | 30 days supply | Qty: 30 | Fill #3

## 2017-06-26 MED FILL — FLUoxetine HCL 40 MG CAPS: 40 | 30 days supply | Qty: 30 | Fill #1

## 2017-06-26 MED FILL — !LANTUS SOLOSTAR 100UNITS/M: 100 | 25 days supply | Qty: 15 | Fill #1

## 2017-07-03 ENCOUNTER — Ambulatory Visit (HOSPITAL_COMMUNITY): Payer: No Typology Code available for payment source | Admitting: Psychiatry

## 2017-07-04 ENCOUNTER — Encounter: Payer: Self-pay | Admitting: Family Medicine

## 2017-07-04 ENCOUNTER — Ambulatory Visit: Payer: Self-pay | Attending: Family Medicine | Admitting: Family Medicine

## 2017-07-04 VITALS — BP 165/112 | HR 77 | Temp 97.5°F | Ht 67.0 in | Wt 208.6 lb

## 2017-07-04 DIAGNOSIS — I110 Hypertensive heart disease with heart failure: Secondary | ICD-10-CM | POA: Insufficient documentation

## 2017-07-04 DIAGNOSIS — E1165 Type 2 diabetes mellitus with hyperglycemia: Secondary | ICD-10-CM | POA: Insufficient documentation

## 2017-07-04 DIAGNOSIS — Z89432 Acquired absence of left foot: Secondary | ICD-10-CM

## 2017-07-04 DIAGNOSIS — E1140 Type 2 diabetes mellitus with diabetic neuropathy, unspecified: Secondary | ICD-10-CM | POA: Insufficient documentation

## 2017-07-04 DIAGNOSIS — M5126 Other intervertebral disc displacement, lumbar region: Secondary | ICD-10-CM | POA: Insufficient documentation

## 2017-07-04 DIAGNOSIS — Z7982 Long term (current) use of aspirin: Secondary | ICD-10-CM | POA: Insufficient documentation

## 2017-07-04 DIAGNOSIS — Z89412 Acquired absence of left great toe: Secondary | ICD-10-CM | POA: Insufficient documentation

## 2017-07-04 DIAGNOSIS — M5136 Other intervertebral disc degeneration, lumbar region: Secondary | ICD-10-CM | POA: Insufficient documentation

## 2017-07-04 DIAGNOSIS — I5032 Chronic diastolic (congestive) heart failure: Secondary | ICD-10-CM | POA: Insufficient documentation

## 2017-07-04 DIAGNOSIS — E118 Type 2 diabetes mellitus with unspecified complications: Secondary | ICD-10-CM

## 2017-07-04 DIAGNOSIS — Z79899 Other long term (current) drug therapy: Secondary | ICD-10-CM | POA: Insufficient documentation

## 2017-07-04 DIAGNOSIS — IMO0002 Reserved for concepts with insufficient information to code with codable children: Secondary | ICD-10-CM

## 2017-07-04 DIAGNOSIS — I10 Essential (primary) hypertension: Secondary | ICD-10-CM

## 2017-07-04 DIAGNOSIS — Z89422 Acquired absence of other left toe(s): Secondary | ICD-10-CM | POA: Insufficient documentation

## 2017-07-04 DIAGNOSIS — Z9889 Other specified postprocedural states: Secondary | ICD-10-CM | POA: Insufficient documentation

## 2017-07-04 DIAGNOSIS — E11621 Type 2 diabetes mellitus with foot ulcer: Secondary | ICD-10-CM | POA: Insufficient documentation

## 2017-07-04 LAB — GLUCOSE, POCT (MANUAL RESULT ENTRY): POC GLUCOSE: 108 mg/dL — AB (ref 70–99)

## 2017-07-04 LAB — POCT GLYCOSYLATED HEMOGLOBIN (HGB A1C): Hemoglobin A1C: 10.1

## 2017-07-04 MED ORDER — INSULIN GLARGINE 100 UNIT/ML SOLOSTAR PEN: PEN_INJECTOR | SUBCUTANEOUS | 3 refills | Status: DC

## 2017-07-04 MED ORDER — CARVEDILOL 25 MG PO TABS: ORAL_TABLET | ORAL | 5 refills | Status: DC

## 2017-07-04 MED ORDER — LIRAGLUTIDE 18 MG/3ML ~~LOC~~ SOPN: 1.8000 mg | PEN_INJECTOR | Freq: Every day | SUBCUTANEOUS | 3 refills | Status: DC

## 2017-07-04 NOTE — Patient Instructions (Signed)
Diabetes Mellitus and Nutrition When you have diabetes (diabetes mellitus), it is very important to have healthy eating habits because your blood sugar (glucose) levels are greatly affected by what you eat and drink. Eating healthy foods in the appropriate amounts, at about the same times every day, can help you:  Control your blood glucose.  Lower your risk of heart disease.  Improve your blood pressure.  Reach or maintain a healthy weight.  Every person with diabetes is different, and each person has different needs for a meal plan. Your health care provider may recommend that you work with a diet and nutrition specialist (dietitian) to make a meal plan that is best for you. Your meal plan may vary depending on factors such as:  The calories you need.  The medicines you take.  Your weight.  Your blood glucose, blood pressure, and cholesterol levels.  Your activity level.  Other health conditions you have, such as heart or kidney disease.  How do carbohydrates affect me? Carbohydrates affect your blood glucose level more than any other type of food. Eating carbohydrates naturally increases the amount of glucose in your blood. Carbohydrate counting is a method for keeping track of how many carbohydrates you eat. Counting carbohydrates is important to keep your blood glucose at a healthy level, especially if you use insulin or take certain oral diabetes medicines. It is important to know how many carbohydrates you can safely have in each meal. This is different for every person. Your dietitian can help you calculate how many carbohydrates you should have at each meal and for snack. Foods that contain carbohydrates include:  Bread, cereal, rice, pasta, and crackers.  Potatoes and corn.  Peas, beans, and lentils.  Milk and yogurt.  Fruit and juice.  Desserts, such as cakes, cookies, ice cream, and candy.  How does alcohol affect me? Alcohol can cause a sudden decrease in blood  glucose (hypoglycemia), especially if you use insulin or take certain oral diabetes medicines. Hypoglycemia can be a life-threatening condition. Symptoms of hypoglycemia (sleepiness, dizziness, and confusion) are similar to symptoms of having too much alcohol. If your health care provider says that alcohol is safe for you, follow these guidelines:  Limit alcohol intake to no more than 1 drink per day for nonpregnant women and 2 drinks per day for men. One drink equals 12 oz of beer, 5 oz of wine, or 1 oz of hard liquor.  Do not drink on an empty stomach.  Keep yourself hydrated with water, diet soda, or unsweetened iced tea.  Keep in mind that regular soda, juice, and other mixers may contain a lot of sugar and must be counted as carbohydrates.  What are tips for following this plan? Reading food labels  Start by checking the serving size on the label. The amount of calories, carbohydrates, fats, and other nutrients listed on the label are based on one serving of the food. Many foods contain more than one serving per package.  Check the total grams (g) of carbohydrates in one serving. You can calculate the number of servings of carbohydrates in one serving by dividing the total carbohydrates by 15. For example, if a food has 30 g of total carbohydrates, it would be equal to 2 servings of carbohydrates.  Check the number of grams (g) of saturated and trans fats in one serving. Choose foods that have low or no amount of these fats.  Check the number of milligrams (mg) of sodium in one serving. Most people   should limit total sodium intake to less than 2,300 mg per day.  Always check the nutrition information of foods labeled as "low-fat" or "nonfat". These foods may be higher in added sugar or refined carbohydrates and should be avoided.  Talk to your dietitian to identify your daily goals for nutrients listed on the label. Shopping  Avoid buying canned, premade, or processed foods. These  foods tend to be high in fat, sodium, and added sugar.  Shop around the outside edge of the grocery store. This includes fresh fruits and vegetables, bulk grains, fresh meats, and fresh dairy. Cooking  Use low-heat cooking methods, such as baking, instead of high-heat cooking methods like deep frying.  Cook using healthy oils, such as olive, canola, or sunflower oil.  Avoid cooking with butter, cream, or high-fat meats. Meal planning  Eat meals and snacks regularly, preferably at the same times every day. Avoid going long periods of time without eating.  Eat foods high in fiber, such as fresh fruits, vegetables, beans, and whole grains. Talk to your dietitian about how many servings of carbohydrates you can eat at each meal.  Eat 4-6 ounces of lean protein each day, such as lean meat, chicken, fish, eggs, or tofu. 1 ounce is equal to 1 ounce of meat, chicken, or fish, 1 egg, or 1/4 cup of tofu.  Eat some foods each day that contain healthy fats, such as avocado, nuts, seeds, and fish. Lifestyle   Check your blood glucose regularly.  Exercise at least 30 minutes 5 or more days each week, or as told by your health care provider.  Take medicines as told by your health care provider.  Do not use any products that contain nicotine or tobacco, such as cigarettes and e-cigarettes. If you need help quitting, ask your health care provider.  Work with a counselor or diabetes educator to identify strategies to manage stress and any emotional and social challenges. What are some questions to ask my health care provider?  Do I need to meet with a diabetes educator?  Do I need to meet with a dietitian?  What number can I call if I have questions?  When are the best times to check my blood glucose? Where to find more information:  American Diabetes Association: diabetes.org/food-and-fitness/food  Academy of Nutrition and Dietetics:  www.eatright.org/resources/health/diseases-and-conditions/diabetes  National Institute of Diabetes and Digestive and Kidney Diseases (NIH): www.niddk.nih.gov/health-information/diabetes/overview/diet-eating-physical-activity Summary  A healthy meal plan will help you control your blood glucose and maintain a healthy lifestyle.  Working with a diet and nutrition specialist (dietitian) can help you make a meal plan that is best for you.  Keep in mind that carbohydrates and alcohol have immediate effects on your blood glucose levels. It is important to count carbohydrates and to use alcohol carefully. This information is not intended to replace advice given to you by your health care provider. Make sure you discuss any questions you have with your health care provider. Document Released: 02/28/2005 Document Revised: 07/08/2016 Document Reviewed: 07/08/2016 Elsevier Interactive Patient Education  2018 [employer]  

## 2017-07-04 NOTE — Progress Notes (Signed)
Subjective:  Patient ID: Shane [name], male    DOB: 02-18-1971  Age: 47 y.o. MRN: [id number]  CC: Diabetes   HPI Shane [name] is a 47 year old male with a history of type 2 diabetes mellitus (A1c 10.1), hypertension, diabetic neuropathy, depression and anxiety who presents today for a follow-up visit.  He was hospitalized at [facility] from 05/18/17 through 05/22/17 for right foot cellulitis and right lower extremity weakness and was discharged on Keflex which he has completed.    MRI of the lumbar spine from 05/20/17: IMPRESSION: 1. No acute finding. 2. 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 3. L3-4 small left extraforaminal protrusion contacting the L3 nerve root, contralateral to symptoms. 4. Diffusely patent spinal canal.  He is accompanied by his spouse today and reports that her right foot cellulitis has resolved, weakness has improved and strength is back to normal.  He has intermittent back pain only when he attempts to lift but denies pain at this time. His blood sugars have improved to the point where he has had some hypoglycemia  in the mornings which led to his decreasing his evening dose of Lantus from 30 units to 15 units but he remains under 30 units of Lantus in the morning. His fasting sugars have been in the 91 range and his random sugars in the 200-220.  He is concerned about weight gain which he has experienced and would like to have something to help with  weight loss. Of note he is on Abilify which he receives from mental health.  Past Medical History:  Diagnosis Date  . Anemia   . Bipolar disorder ([place])   . Cellulitis 05/2017   RIGHT FOOT   DIABETIC ULCER   . Cellulitis and abscess of left leg 06/2016  . Chest pain    a. 2015 Reportedly normal stress test in [place].  Marland Kitchen Chronic diastolic CHF (congestive heart failure) (HCC)    a.03/2015 Echo: EF 55-60%, Gr 1 DD, mild MR, triv PR.  .  Depression   . Depression with anxiety   . Dyspnea    with exertion  . GERD (gastroesophageal reflux disease)   . Glaucoma   . Hyperlipidemia   . Hypertension    a. 08/2014 Admitted with hypertensive urgency.  . Insomnia   . Internal carotid artery stenosis   . Lower GI bleed 07/04/2015  . Neuropathy   . Refusal of blood transfusions as patient is Jehov[employer]'s Witness   . TIA (transient ischemic attack) 08/2014; 03/2015   a. 08/2014 in setting of hypertensive urgency.  . Type II diabetes mellitus ([place])    started insulin spring 2016, Type II  . Vitamin D deficiency spring 2016    Past Surgical History:  Procedure Laterality Date  . AMPUTATION Left 08/03/2015   Procedure: AMPUTATION LEFT GREAT TOE;  Surgeon: Newt Minion, MD;  Location: [place];  Service: Orthopedics;  Laterality: Left;  . AMPUTATION Left 12/02/2015   Procedure: amputation of left 2nd digit  . AMPUTATION Left 04/10/2016   Procedure: Left 2nd Toe Amputation at MTP Joint;  Surgeon: Newt Minion, MD;  Location: [place];  Service: Orthopedics;  Laterality: Left;  . AMPUTATION Left 06/09/2016   Procedure: AMPUTATION LEFT THIRD TOE;  Surgeon: [name], MD;  Location: [place];  Service: Orthopedics;  Laterality: Left;  . AMPUTATION Left 06/26/2016   Procedure: Left Foot Transmetatarsal Amputation;  Surgeon: Newt Minion, MD;  Location: [place];  Service: Orthopedics;  Laterality: Left;  . APPLICATION OF WOUND VAC Left 12/19/2015   Procedure: APPLICATION OF WOUND VAC;  Surgeon: Meredith Pel, MD;  Location: [place];  Service: Orthopedics;  Laterality: Left;  . CIRCUMCISION    . COLONOSCOPY N/A 01/22/2016   Procedure: COLONOSCOPY;  Surgeon: Gatha Mayer, MD;  Location: [place];  Service: Endoscopy;  Laterality: N/A;  . ESOPHAGOGASTRODUODENOSCOPY N/A 01/19/2016   Procedure: ESOPHAGOGASTRODUODENOSCOPY (EGD);  Surgeon: Manus Gunning, MD;  Location: [place];  Service: Gastroenterology;  Laterality: N/A;  .  ESOPHAGOGASTRODUODENOSCOPY N/A 01/22/2016   Procedure: ESOPHAGOGASTRODUODENOSCOPY (EGD);  Surgeon: Gatha Mayer, MD;  Location: [facility] ENDOSCOPY;  Service: Endoscopy;  Laterality: N/A;  . I&D EXTREMITY Left 12/02/2015   Procedure: IRRIGATION AND DEBRIDEMENT OF FOOT; LEFT SECOND TOE AMPUTATION;  Surgeon: Meredith Pel, MD;  Location: [place];  Service: Orthopedics;  Laterality: Left;  . I&D EXTREMITY Left 12/19/2015   Procedure: I & D LEFT FOOT WITH BEADS ;  Surgeon: Meredith Pel, MD;  Location: [place];  Service: Orthopedics;  Laterality: Left;  . I&D EXTREMITY Right 01/17/2016   Procedure: IRRIGATION AND DEBRIDEMENT RIGHT FOOT;  Surgeon: Newt Minion, MD;  Location: [place];  Service: Orthopedics;  Laterality: Right;  . INCISION AND DRAINAGE FOOT Right 01/17/2016  . INGUINAL HERNIA REPAIR Bilateral ~ 1983- ~ 1986  . TONSILLECTOMY  ~ 1985    Allergies  Allergen Reactions  . Lactose Intolerance (Gi) Diarrhea and Other (See Comments)    Bloating (also)  . Other Other (See Comments)    Red meat causes stomach pains, bloating and diarrhea  . Milk-Related Compounds Diarrhea and Other (See Comments)    Any dairy products  - diarrhea and bloating     Outpatient Medications Prior to Visit  Medication Sig Dispense Refill  . amLODipine (NORVASC) 10 MG tablet Take 1 tablet (10 mg total) by mouth daily. 30 tablet 11  . ARIPiprazole (ABILIFY) 2 MG tablet TAKE 2 TABLETS (4 MG TOTAL) BY MOUTH DAILY. 60 tablet 0  . aspirin EC 81 MG tablet Take 1 tablet (81 mg total) by mouth daily. 30 tablet 11  . atorvastatin (LIPITOR) 80 MG tablet Take 1 tablet (80 mg total) by mouth every morning. 30 tablet 5  . Blood Glucose Monitoring Suppl (TRUE METRIX METER) DEVI 1 each by Does not apply route 3 (three) times daily. 1 Device 0  . Elastic Bandages & Supports (T.E.D. KNEE LENGTH/S-REGULAR) MISC Wear when awake 1 each 0  . FLUoxetine (PROZAC) 40 MG capsule TAKE 1 CAPSULE (40 MG TOTAL) BY MOUTH DAILY. 60 capsule 0    . fluticasone (FLONASE) 50 MCG/ACT nasal spray Place 2 sprays into both nostrils daily. 16 g 0  . gabapentin (NEURONTIN) 300 MG capsule Take 1 capsule (300 mg total) by mouth 2 (two) times daily. 60 capsule 5  . glipiZIDE (GLUCOTROL XL) 10 MG 24 hr tablet Take 2 tablets (20 mg total) by mouth daily with breakfast. (Patient taking differently: Take 10 mg by mouth 2 (two) times daily. ) 60 tablet 5  . glucose blood (ACCU-CHEK AVIVA PLUS) test strip Use as instructed 100 each 12  . glucose blood (TRUE METRIX BLOOD GLUCOSE TEST) test strip 1 each by Other route 3 (three) times daily. 100 each 12  . ibuprofen (ADVIL,MOTRIN) 800 MG tablet TAKE 1 TABLET BY MOUTH EVERY 8 HOURS AS NEEDED. (Patient taking differently: TAKE 1 TABLET (800mg ) BY MOUTH EVERY 8 HOURS AS NEEDED FOR PAIN) 30  tablet 0  . Insulin Pen Needle (B-D ULTRAFINE III SHORT PEN) 31G X 8 MM MISC 1 application by Does not apply route 3 (three) times daily. 90 each 11  . Lancet Devices (ACCU-CHEK SOFTCLIX) lancets Use as instructed 1 each 0  . lisinopril (PRINIVIL,ZESTRIL) 40 MG tablet Take 1 tablet (40 mg total) by mouth daily. 30 tablet 5  . ondansetron (ZOFRAN ODT) 8 MG disintegrating tablet Take 1 tablet (8 mg total) by mouth every 8 (eight) hours as needed for nausea or vomiting. 30 tablet 0  . pantoprazole (PROTONIX) 40 MG tablet Take 1 tablet (40 mg total) by mouth 2 (two) times daily before a meal. 60 tablet 3  . polyethylene glycol (MIRALAX / GLYCOLAX) packet Take 17 g by mouth daily as needed for moderate constipation. 14 each 0  . traZODone (DESYREL) 100 MG tablet TAKE 2 TABLETS BY MOUTH AT BEDTIME. 90 tablet 1  . TRUEPLUS LANCETS 28G MISC 1 each by Does not apply route 3 (three) times daily. 100 each 11  . carvedilol (COREG) 12.5 MG tablet TAKE 1 TABLET BY MOUTH 2 TIMES DAILY WITH A MEAL (Patient taking differently: Take 12.5 mg by mouth 2 (two) times daily with a meal. ) 60 tablet 5  . Insulin Glargine (LANTUS SOLOSTAR) 100 UNIT/ML  Solostar Pen Inject 30 Units into the skin 2 (two) times daily. 5 pen 3  . folic acid (FOLVITE) 1 MG tablet Take 1 tablet (1 mg total) by mouth daily. (Patient not taking: Reported on 07/04/2017) 30 tablet 3  . senna-docusate (SENOKOT-S) 8.6-50 MG tablet Take 1 tablet by mouth at bedtime. (Patient not taking: Reported on 07/04/2017) 60 tablet 2  . sucralfate (CARAFATE) 1 g tablet Take 1 tablet (1 g total) by mouth 4 (four) times daily -  with meals and at bedtime. (Patient not taking: Reported on 05/18/2017) 120 tablet 0  . Vitamin D, Ergocalciferol, (DRISDOL) 50000 units CAPS capsule Take 1 capsule (50,000 Units total) by mouth every 30 (thirty) days. Every 30 days (Patient not taking: Reported on 05/07/2017) 12 capsule 1   No facility-administered medications prior to visit.     ROS Review of Systems  Constitutional: Negative for activity change and appetite change.  HENT: Negative for sinus pressure and sore throat.   Eyes: Negative for visual disturbance.  Respiratory: Negative for cough, chest tightness and shortness of breath.   Cardiovascular: Negative for chest pain and leg swelling.  Gastrointestinal: Negative for abdominal distention, abdominal pain, constipation and diarrhea.  Endocrine: Negative.   Genitourinary: Negative for dysuria.  Musculoskeletal: Negative for joint swelling and myalgias.  Skin: Negative for rash.  Allergic/Immunologic: Negative.   Neurological: Negative for weakness, light-headedness and numbness.  Psychiatric/Behavioral: Negative for dysphoric mood and suicidal ideas.    Objective:  BP (!) 165/112   Pulse 77   Temp (!) 97.5 F (36.4 C) (Oral)   Ht 5\' 7"  (1.702 m)   Wt 208 lb 9.6 oz (94.6 kg)   SpO2 99%   BMI 32.67 kg/m   BP/Weight 07/04/2017 05/22/2017 61/95/0932  Systolic BP 671 245 809  Diastolic BP 983 80 92  Wt. (Lbs) 208.6 237.66 193  BMI 32.67 37.22 30.23  Some encounter information is confidential and restricted. Go to Review Flowsheets  activity to see all data.      Physical Exam  Constitutional: He is oriented to person, place, and time. He appears well-developed and well-nourished.  Cardiovascular: Normal rate, normal heart sounds and intact distal pulses.  No  murmur heard. Pulmonary/Chest: Effort normal and breath sounds normal. He has no wheezes. He has no rales. He exhibits no tenderness.  Abdominal: Soft. Bowel sounds are normal. He exhibits no distension and no mass. There is no tenderness.  Musculoskeletal: Normal range of motion.  Left transmetatarsal foot amputation  Neurological: He is alert and oriented to person, place, and time.  Skin: Skin is warm and dry.  Psychiatric: He has a normal mood and affect.     Lab Results  Component Value Date   HGBA1C 10.1 07/04/2017    Assessment & Plan:   1. Diabetes mellitus type 2, uncontrolled, with complications ([place]) Controlled with A1c of 10.0 Victoza added to regimen which will also help with weight Discussed instructions regarding titrating down dose of Lantus if blood sugars tend to run in the hypoglycemic range while uptitrating Victoza dose Counseled on Diabetic diet, my plate method, 098 minutes of moderate intensity exercise/week Keep blood sugar logs with fasting goals of 80-120 mg/dl, random of less than 180 and in the event of sugars less than 60 mg/dl or greater than 400 mg/dl please notify the clinic ASAP. It is recommended that you undergo annual eye exams and annual foot exams. Pneumovax is recommended every 5 years before the age of 37 and once for a lifetime at or after the age of 52. - POCT glucose (manual entry) - POCT glycosylated hemoglobin (Hb A1C) - Insulin Glargine (LANTUS SOLOSTAR) 100 UNIT/ML Solostar Pen; Inject subcutaneously 30 units in the morning and 15 units in the evening  Dispense: 5 pen; Refill: 3 - liraglutide (VICTOZA) 18 MG/3ML SOPN; Inject 0.3 mLs (1.8 mg total) into the skin daily with breakfast. Start with 0.1 mls for  1 week then 0.2 mls for 1 week then 0.3 mls thereafter  Dispense: 3 pen; Refill: 3  2. Essential hypertension Uncontrolled Increase dose of carvedilol - carvedilol (COREG) 25 MG tablet; TAKE 1 TABLET BY MOUTH 2 TIMES DAILY WITH A MEAL  Dispense: 60 tablet; Refill: 5  3. Bulging lumbar disc Asymptomatic at this time High risk for falls- ambulate with the aid of a cane  4. S/P transmetatarsal amputation of foot, left (HCC) Risk factor modification   Meds ordered this encounter  Medications  . carvedilol (COREG) 25 MG tablet    Sig: TAKE 1 TABLET BY MOUTH 2 TIMES DAILY WITH A MEAL    Dispense:  60 tablet    Refill:  5    Discontinue previous dose  . Insulin Glargine (LANTUS SOLOSTAR) 100 UNIT/ML Solostar Pen    Sig: Inject subcutaneously 30 units in the morning and 15 units in the evening    Dispense:  5 pen    Refill:  3    Discontinue previous dose  . liraglutide (VICTOZA) 18 MG/3ML SOPN    Sig: Inject 0.3 mLs (1.8 mg total) into the skin daily with breakfast. Start with 0.1 mls for 1 week then 0.2 mls for 1 week then 0.3 mls thereafter    Dispense:  3 pen    Refill:  3    Follow-up: Return in about 3 months (around 10/02/2017) for Follow-up of chronic medical conditions.   Arnoldo Morale MD

## 2017-07-08 ENCOUNTER — Telehealth (HOSPITAL_COMMUNITY): Payer: Self-pay

## 2017-07-08 NOTE — Telephone Encounter (Signed)
Ideally we can taper abilify off and discontinue. This would depend on his mood of course.  If he is feeling ready to begin tapering the abilify, he can decrease to 2 mg (1 tablet) daily, and then we can further decrease or discontinue after his follow-up with me in February.

## 2017-07-08 NOTE — Telephone Encounter (Signed)
Medication problem - Telephone call with patient after he left a message he has an increase in appetite and weight, more than 20 pounds since starting Abilify. Pt. last seen 01/09/17 as cancelled 04/09/17 and 07/03/17. Rescheduled for 08/06/17. Informed patient this nurse would send request with concern to Dr. Daron Offer but also reminded patient he needed to keep rescheduled next appointment as he has not been seen for follow-up since 01/09/17 and patient agreed with plan.  Informed would request Dr. Daron Offer contact patient back to discuss if he would be willing to try him on something else due to patient's concern for weight gain or if would need to wait until appointment to discuss.

## 2017-07-11 NOTE — Telephone Encounter (Signed)
Medication management - Telephone call with patient to discuss plan to possibly taper Abilify down to 2 mg a day, one tablet of the 2 mg and patient reported this is what he has been taking since his last evaluation and never took 2 tablets a day.  Agreed to send this message to Dr. Daron Offer to see if he wanted him to taper any further until he sees him and will contact patient back once Dr. Daron Offer reviews.  Patient agreed with plan and denied any acute problems with this plan at present.

## 2017-07-13 ENCOUNTER — Encounter (HOSPITAL_COMMUNITY): Payer: Self-pay

## 2017-07-13 ENCOUNTER — Other Ambulatory Visit: Payer: Self-pay

## 2017-07-13 ENCOUNTER — Emergency Department (HOSPITAL_COMMUNITY)
Admission: EM | Admit: 2017-07-13 | Discharge: 2017-07-13 | Disposition: A | Discharge status: Home / Self Care | Payer: Self-pay | Attending: Emergency Medicine | Admitting: Emergency Medicine

## 2017-07-13 ENCOUNTER — Emergency Department (HOSPITAL_COMMUNITY): Payer: Self-pay

## 2017-07-13 DIAGNOSIS — N183 Chronic kidney disease, stage 3 (moderate): Secondary | ICD-10-CM | POA: Insufficient documentation

## 2017-07-13 DIAGNOSIS — I10 Essential (primary) hypertension: Secondary | ICD-10-CM

## 2017-07-13 DIAGNOSIS — I5032 Chronic diastolic (congestive) heart failure: Secondary | ICD-10-CM | POA: Insufficient documentation

## 2017-07-13 DIAGNOSIS — I130 Hypertensive heart and chronic kidney disease with heart failure and stage 1 through stage 4 chronic kidney disease, or unspecified chronic kidney disease: Secondary | ICD-10-CM | POA: Insufficient documentation

## 2017-07-13 DIAGNOSIS — E1122 Type 2 diabetes mellitus with diabetic chronic kidney disease: Secondary | ICD-10-CM | POA: Insufficient documentation

## 2017-07-13 DIAGNOSIS — Z8673 Personal history of transient ischemic attack (TIA), and cerebral infarction without residual deficits: Secondary | ICD-10-CM | POA: Insufficient documentation

## 2017-07-13 DIAGNOSIS — Z794 Long term (current) use of insulin: Secondary | ICD-10-CM | POA: Insufficient documentation

## 2017-07-13 DIAGNOSIS — M79671 Pain in right foot: Secondary | ICD-10-CM | POA: Insufficient documentation

## 2017-07-13 DIAGNOSIS — R519 Headache, unspecified: Secondary | ICD-10-CM

## 2017-07-13 DIAGNOSIS — R51 Headache: Secondary | ICD-10-CM | POA: Insufficient documentation

## 2017-07-13 DIAGNOSIS — Z7982 Long term (current) use of aspirin: Secondary | ICD-10-CM | POA: Insufficient documentation

## 2017-07-13 DIAGNOSIS — Z79899 Other long term (current) drug therapy: Secondary | ICD-10-CM | POA: Insufficient documentation

## 2017-07-13 LAB — CBC WITH DIFFERENTIAL/PLATELET
BASOS ABS: 0.1 10*3/uL (ref 0.0–0.1)
Basophils Relative: 1 %
EOS ABS: 0.3 10*3/uL (ref 0.0–0.7)
EOS PCT: 4 %
HCT: 36 % — ABNORMAL LOW (ref 39.0–52.0)
Hemoglobin: 11.5 g/dL — ABNORMAL LOW (ref 13.0–17.0)
Lymphocytes Relative: 23 %
Lymphs Abs: 1.9 10*3/uL (ref 0.7–4.0)
MCH: 29.4 pg (ref 26.0–34.0)
MCHC: 31.9 g/dL (ref 30.0–36.0)
MCV: 92.1 fL (ref 78.0–100.0)
Monocytes Absolute: 0.5 10*3/uL (ref 0.1–1.0)
Monocytes Relative: 6 %
Neutro Abs: 5.2 10*3/uL (ref 1.7–7.7)
Neutrophils Relative %: 66 %
PLATELETS: 216 10*3/uL (ref 150–400)
RBC: 3.91 MIL/uL — AB (ref 4.22–5.81)
RDW: 13.2 % (ref 11.5–15.5)
WBC: 7.9 10*3/uL (ref 4.0–10.5)

## 2017-07-13 LAB — BASIC METABOLIC PANEL
ANION GAP: 8 (ref 5–15)
BUN: 17 mg/dL (ref 6–20)
CO2: 25 mmol/L (ref 22–32)
Calcium: 8.6 mg/dL — ABNORMAL LOW (ref 8.9–10.3)
Chloride: 105 mmol/L (ref 101–111)
Creatinine, Ser: 1.27 mg/dL — ABNORMAL HIGH (ref 0.61–1.24)
GFR calc Af Amer: >60 mL/min (ref >60)
Glucose, Bld: 259 mg/dL — ABNORMAL HIGH (ref 65–99)
POTASSIUM: 3.9 mmol/L (ref 3.5–5.1)
SODIUM: 138 mmol/L (ref 135–145)

## 2017-07-13 MED ORDER — PROCHLORPERAZINE EDISYLATE 5 MG/ML IJ SOLN
10.0000 mg | Freq: Once | INTRAMUSCULAR | Status: AC
  Administered 2017-07-13: 10 mg via INTRAVENOUS
  Filled 2017-07-13: qty 2

## 2017-07-13 MED ORDER — SODIUM CHLORIDE 0.9 % IV BOLUS (SEPSIS)
500.0000 mL | Freq: Once | INTRAVENOUS | Status: AC
  Administered 2017-07-13: 500 mL via INTRAVENOUS

## 2017-07-13 MED ORDER — TETRACAINE HCL 0.5 % OP SOLN
2.0000 [drp] | Freq: Once | OPHTHALMIC | Status: AC
  Administered 2017-07-13: 2 [drp] via OPHTHALMIC
  Filled 2017-07-13: qty 4

## 2017-07-13 MED ORDER — KETOROLAC TROMETHAMINE 30 MG/ML IJ SOLN
30.0000 mg | Freq: Once | INTRAMUSCULAR | Status: AC
  Administered 2017-07-13: 30 mg via INTRAVENOUS
  Filled 2017-07-13: qty 1

## 2017-07-13 MED ORDER — DIPHENHYDRAMINE HCL 50 MG/ML IJ SOLN
12.5000 mg | Freq: Once | INTRAMUSCULAR | Status: AC
  Administered 2017-07-13: 12.5 mg via INTRAVENOUS
  Filled 2017-07-13: qty 1

## 2017-07-13 MED ORDER — CEPHALEXIN 500 MG PO CAPS: 500.0000 mg | ORAL_CAPSULE | Freq: Three times a day (TID) | ORAL | 0 refills | Status: DC

## 2017-07-13 NOTE — ED Provider Notes (Signed)
Assumed care of patient from [facility] PA-C at shift change, please see her note for full H&P, briefly patient is here for evaluation of headache, and waking up and realizing that 3 of his toenails have fallen off.  Plan is to follow-up his response to migraine/headache treatment.  I introduced myself to patient, and explained my role in his care.  He reports that his headache is better, however still present and he wishes to go home at this time.  He was given return precautions and states his understanding.  Patient is resting in bed in no obvious distress, well-appearing.  Patient is awake and alert and interacting appropriately.  Will discharge patient, PCP follow up.    Lorin Glass, PA-C 07/13/17 1909    Hayden Rasmussen, MD 07/15/17 [id number]

## 2017-07-13 NOTE — ED Triage Notes (Signed)
Pt presents for evaluation of headache and HTN x 3 weeks. Reports saw PCP last week. States woke up this morning and noticed that three toenails had fallen off the right foot. Pt reports mild pain to foot and no discoloration, does have hx of diabetes.

## 2017-07-13 NOTE — ED Notes (Signed)
Pt able to stand beside of bed and use urinal.

## 2017-07-13 NOTE — Discharge Instructions (Addendum)
It was my pleasure taking care of you today!   Please take all of your antibiotics until finished! Please wash right foot with soap and water twice daily. Dry foot well. This will help prevent infection.   Call the eye doctor listed (Dr. Noel Journey) tomorrow morning to schedule a follow up appointment.   Continue to take your blood pressure medication as directed by your primary care physician (25 mg twice daily).  Please call your primary care doctor tomorrow to schedule a blood pressure check follow up.   Return to ER for new or worsening symptoms, any additional concerns.

## 2017-07-13 NOTE — ED Provider Notes (Signed)
[name] Provider Note   CSN: [id number] Arrival date & time: 07/13/17  5809     History   Chief Complaint Chief Complaint  Patient presents with  . Headache  . Toe Pain    HPI Shane [name] is a 47 y.o. male.  The history is provided by the patient and medical records. No language interpreter was used.  Headache    Toe Pain  Associated symptoms include headaches.   Shane [name] is a 47 y.o. male  with a PMH of HTN, HLD, DM2 who presents to the Emergency Department with two complaints:    1. Generalized constant headache x 3 weeks. Feels like a sharp pain. He has tried taking ibuprofen 200mg  about every other day with little improvement. He states that he has had worsening vision bilaterally over the last month. He has blurred vision at baseline which he contributes to prior retinal detachment but feels as if vision is getting worse over the last 3-4 weeks. He was suppose to see retinal specialist in April, but due to insurance reasons, was unable to follow up.  2. Throbbing right-sided foot pain x 3 weeks. Pain comes and goes. He denies any injury or trauma, but this morning the toe nails of his 3rd-5th toes just fell off. No redness or swelling.   Past Medical History:  Diagnosis Date  . Anemia   . Bipolar disorder ([place])   . Cellulitis 05/2017   RIGHT FOOT   DIABETIC ULCER   . Cellulitis and abscess of left leg 06/2016  . Chest pain    a. 2015 Reportedly normal stress test in [place].  Marland Kitchen Chronic diastolic CHF (congestive heart failure) (HCC)    a.03/2015 Echo: EF 55-60%, Gr 1 DD, mild MR, triv PR.  . Depression   . Depression with anxiety   . Dyspnea    with exertion  . GERD (gastroesophageal reflux disease)   . Glaucoma   . Hyperlipidemia   . Hypertension    a. 08/2014 Admitted with hypertensive urgency.  . Insomnia   . Internal carotid artery stenosis   . Lower GI bleed 07/04/2015  . Neuropathy   . Refusal of blood  transfusions as patient is Jehovah's Witness   . TIA (transient ischemic attack) 08/2014; 03/2015   a. 08/2014 in setting of hypertensive urgency.  . Type II diabetes mellitus ([place])    started insulin spring 2016, Type II  . Vitamin D deficiency spring 2016    Patient Active Problem List   Diagnosis Date Noted  . Bulging lumbar disc 07/04/2017  . Weakness of right lower extremity 05/19/2017  . Cellulitis 05/18/2017  . Achilles tendon contracture, left 01/14/2017  . Gingivitis 11/14/2016  . Achilles tendon contracture, right 09/17/2016  . Pain and swelling of left lower leg 09/16/2016  . CKD (chronic kidney disease) stage 3, GFR 30-59 ml/min (HCC) 08/27/2016  . Dyslipidemia associated with type 2 diabetes mellitus ([place]) 07/22/2016  . Constipation 07/02/2016  . S/P transmetatarsal amputation of foot, left ([place]) 07/02/2016  . Dyspnea on exertion 07/02/2016  . Diabetic gastroparesis ([place]) 05/30/2016  . Buzzing in ear, right 03/26/2016  . Joint pain 03/11/2016  . Weakness 03/08/2016  . Dizziness 03/03/2016  . Chronic diastolic CHF (congestive heart failure), NYHA class 1 ([place]) 12/30/2015  . Diabetes mellitus type 2, uncontrolled, with complications ([place]) 98/33/8250  . Depression with anxiety 12/01/2015  . Pain in the chest 12/01/2015  . Insomnia 11/21/2015  . GERD (gastroesophageal reflux disease)  08/18/2015  . Erectile dysfunction 07/04/2015  . Symptomatic anemia 04/24/2015  . Left arm weakness 04/02/2015  . Sensory disturbance 04/02/2015  . Essential hypertension 04/02/2015  . Hemispheric carotid artery syndrome   . HLD (hyperlipidemia)   . Headache     Past Surgical History:  Procedure Laterality Date  . AMPUTATION Left 08/03/2015   Procedure: AMPUTATION LEFT GREAT TOE;  Surgeon: Newt Minion, MD;  Location: [facility];  Service: Orthopedics;  Laterality: Left;  . AMPUTATION Left 12/02/2015   Procedure: amputation of left 2nd digit  . AMPUTATION Left 04/10/2016   Procedure:  Left 2nd Toe Amputation at MTP Joint;  Surgeon: Newt Minion, MD;  Location: [facility];  Service: Orthopedics;  Laterality: Left;  . AMPUTATION Left 06/09/2016   Procedure: AMPUTATION LEFT THIRD TOE;  Surgeon: [name], MD;  Location: [facility];  Service: Orthopedics;  Laterality: Left;  . AMPUTATION Left 06/26/2016   Procedure: Left Foot Transmetatarsal Amputation;  Surgeon: Newt Minion, MD;  Location: [facility];  Service: Orthopedics;  Laterality: Left;  . APPLICATION OF WOUND VAC Left 12/19/2015   Procedure: APPLICATION OF WOUND VAC;  Surgeon: Meredith Pel, MD;  Location: [facility];  Service: Orthopedics;  Laterality: Left;  . CIRCUMCISION    . COLONOSCOPY N/A 01/22/2016   Procedure: COLONOSCOPY;  Surgeon: Gatha Mayer, MD;  Location: [facility];  Service: Endoscopy;  Laterality: N/A;  . ESOPHAGOGASTRODUODENOSCOPY N/A 01/19/2016   Procedure: ESOPHAGOGASTRODUODENOSCOPY (EGD);  Surgeon: Manus Gunning, MD;  Location: [facility];  Service: Gastroenterology;  Laterality: N/A;  . ESOPHAGOGASTRODUODENOSCOPY N/A 01/22/2016   Procedure: ESOPHAGOGASTRODUODENOSCOPY (EGD);  Surgeon: Gatha Mayer, MD;  Location: [facility] ENDOSCOPY;  Service: Endoscopy;  Laterality: N/A;  . I&D EXTREMITY Left 12/02/2015   Procedure: IRRIGATION AND DEBRIDEMENT OF FOOT; LEFT SECOND TOE AMPUTATION;  Surgeon: Meredith Pel, MD;  Location: [facility];  Service: Orthopedics;  Laterality: Left;  . I&D EXTREMITY Left 12/19/2015   Procedure: I & D LEFT FOOT WITH BEADS ;  Surgeon: Meredith Pel, MD;  Location: [place];  Service: Orthopedics;  Laterality: Left;  . I&D EXTREMITY Right 01/17/2016   Procedure: IRRIGATION AND DEBRIDEMENT RIGHT FOOT;  Surgeon: Newt Minion, MD;  Location: [place];  Service: Orthopedics;  Laterality: Right;  . INCISION AND DRAINAGE FOOT Right 01/17/2016  . INGUINAL HERNIA REPAIR Bilateral ~ 1983- ~ 1986  . TONSILLECTOMY  ~ 1985       Home Medications    Prior to Admission medications   Medication Sig  Start Date End Date Taking? Authorizing Provider  amLODipine (NORVASC) 10 MG tablet Take 1 tablet (10 mg total) by mouth daily. 01/20/17  Yes Funches, Josalyn, MD  ARIPiprazole (ABILIFY) 2 MG tablet TAKE 2 TABLETS (4 MG TOTAL) BY MOUTH DAILY. 05/19/17  Yes Eksir, Richard Miu, MD  aspirin EC 81 MG tablet Take 1 tablet (81 mg total) by mouth daily. 09/16/16  Yes Funches, Josalyn, MD  atorvastatin (LIPITOR) 80 MG tablet Take 1 tablet (80 mg total) by mouth every morning. 03/03/17  Yes Newlin, Charlane Ferretti, MD  carvedilol (COREG) 25 MG tablet TAKE 1 TABLET BY MOUTH 2 TIMES DAILY WITH A MEAL 07/04/17  Yes Newlin, Enobong, MD  FLUoxetine (PROZAC) 40 MG capsule TAKE 1 CAPSULE (40 MG TOTAL) BY MOUTH DAILY. 05/19/17  Yes Eksir, Richard Miu, MD  gabapentin (NEURONTIN) 300 MG capsule Take 1 capsule (300 mg total) by mouth 2 (two) times daily. 05/07/17  Yes Charlott Rakes, MD  glipiZIDE (  GLUCOTROL XL) 10 MG 24 hr tablet Take 2 tablets (20 mg total) by mouth daily with breakfast. Patient taking differently: Take 10 mg by mouth 2 (two) times daily.  03/03/17  Yes Charlott Rakes, MD  Insulin Glargine (LANTUS SOLOSTAR) 100 UNIT/ML Solostar Pen Inject subcutaneously 30 units in the morning and 15 units in the evening 07/04/17  Yes Newlin, Enobong, MD  liraglutide (VICTOZA) 18 MG/3ML SOPN Inject 0.3 mLs (1.8 mg total) into the skin daily with breakfast. Start with 0.1 mls for 1 week then 0.2 mls for 1 week then 0.3 mls thereafter 07/04/17  Yes Newlin, Enobong, MD  lisinopril (PRINIVIL,ZESTRIL) 40 MG tablet Take 1 tablet (40 mg total) by mouth daily. 03/03/17  Yes Charlott Rakes, MD  pantoprazole (PROTONIX) 40 MG tablet Take 1 tablet (40 mg total) by mouth 2 (two) times daily before a meal. 02/14/17  Yes Lemmon, Lavone Nian, PA  polyethylene glycol (MIRALAX / GLYCOLAX) packet Take 17 g by mouth daily as needed for moderate constipation. 07/03/16  Yes Asencion Partridge, MD  traZODone (DESYREL) 100 MG tablet TAKE 2 TABLETS BY MOUTH  AT BEDTIME. Patient taking differently: TAKE 2 TABLETS (200 mg)  BY MOUTH AT BEDTIME. 05/26/17  Yes Eksir, Richard Miu, MD  Blood Glucose Monitoring Suppl (TRUE METRIX METER) DEVI 1 each by Does not apply route 3 (three) times daily. 03/17/17   Charlott Rakes, MD  cephALEXin (KEFLEX) 500 MG capsule Take 1 capsule (500 mg total) by mouth 3 (three) times daily. 07/13/17   Nikola Blackston, Ozella Almond, PA-C  Elastic Bandages & Supports (T.E.D. KNEE LENGTH/S-REGULAR) MISC Wear when awake 09/20/16   Pollina, Gwenyth Allegra, MD  fluticasone (FLONASE) 50 MCG/ACT nasal spray Place 2 sprays into both nostrils daily. 09/01/16   Julianne Rice, MD  folic acid (FOLVITE) 1 MG tablet Take 1 tablet (1 mg total) by mouth daily. Patient not taking: Reported on 07/04/2017 07/04/16   Asencion Partridge, MD  glucose blood (ACCU-CHEK AVIVA PLUS) test strip Use as instructed 02/24/17   Charlott Rakes, MD  glucose blood (TRUE METRIX BLOOD GLUCOSE TEST) test strip 1 each by Other route 3 (three) times daily. 03/17/17   Charlott Rakes, MD  ibuprofen (ADVIL,MOTRIN) 800 MG tablet TAKE 1 TABLET BY MOUTH EVERY 8 HOURS AS NEEDED. Patient taking differently: TAKE 1 TABLET (800mg ) BY MOUTH EVERY 8 HOURS AS NEEDED FOR PAIN 01/10/17   Funches, Adriana Mccallum, MD  ondansetron (ZOFRAN ODT) 8 MG disintegrating tablet Take 1 tablet (8 mg total) by mouth every 8 (eight) hours as needed for nausea or vomiting. Patient not taking: Reported on 07/13/2017 11/12/16   Boykin Nearing, MD  senna-docusate (SENOKOT-S) 8.6-50 MG tablet Take 1 tablet by mouth at bedtime. Patient not taking: Reported on 07/04/2017 07/03/16   Asencion Partridge, MD  sucralfate (CARAFATE) 1 g tablet Take 1 tablet (1 g total) by mouth 4 (four) times daily -  with meals and at bedtime. Patient not taking: Reported on 05/18/2017 02/14/17   Levin Erp, PA  Vitamin D, Ergocalciferol, (DRISDOL) 50000 units CAPS capsule Take 1 capsule (50,000 Units total) by mouth every 30 (thirty) days. Every  30 days Patient not taking: Reported on 05/07/2017 04/26/16   Boykin Nearing, MD    Family History Family History  Problem Relation Age of Onset  . Hypertension Mother   . Diabetes Mother   . Hyperlipidemia Mother   . Heart disease Mother        s/p pacemaker  . Diabetes Father   . Hypertension Father   .  Stroke Father   . Heart attack Father        first MI @ 17.  . Depression Father   . Dementia Father   . Stroke Brother   . ADD / ADHD Brother   . Anxiety disorder Brother   . Bipolar disorder Brother   . OCD Brother   . Sexual abuse Brother     Social History Social History   Tobacco Use  . Smoking status: Never Smoker  . Smokeless tobacco: Never Used  Substance Use Topics  . Alcohol use: No    Alcohol/week: 0.0 oz  . Drug use: No     Allergies   Lactose intolerance (gi); Other; and Milk-related compounds   Review of Systems Review of Systems  Musculoskeletal: Positive for arthralgias.  Skin: Positive for wound (Toe nails fell off).  Neurological: Positive for headaches. Negative for dizziness and syncope.  All other systems reviewed and are negative.    Physical Exam Updated Vital Signs BP (!) 189/114   Pulse 80   Temp 97.6 F (36.4 C) (Oral)   Resp 17   SpO2 95%   Physical Exam  Constitutional: He is oriented to person, place, and time. He appears well-developed and well-nourished. No distress.  HENT:  Head: Normocephalic and atraumatic.  Eyes: EOM are normal.  Right pupil irregular, elliptical shape. Left normal.  IOP 12 bilaterally.  Cardiovascular: Normal rate, regular rhythm and normal heart sounds.  No murmur heard. Pulmonary/Chest: Effort normal and breath sounds normal. No respiratory distress.  Abdominal: Soft. He exhibits no distension. There is no tenderness.  Musculoskeletal:  Tenderness to palpation of the right 3rd-5th digits. Toenails missing. No erythema, warmth or drainage. 2+ DP on the right. Sensation intact. Full ROM.    Neurological: He is alert and oriented to person, place, and time.  Alert, oriented, thought content appropriate, able to give a coherent history. Speech is clear and goal oriented, able to follow commands.  Cranial Nerves:  II:  Peripheral visual fields grossly normal. Irregular right pupil.  III, IV, VI: EOM intact bilaterally, ptosis not present V,VII: smile symmetric, eyes kept closed tightly against resistance, facial light touch sensation equal VIII: hearing grossly normal IX, X: symmetric soft palate movement, uvula elevates symmetrically  XI: bilateral shoulder shrug symmetric and strong XII: midline tongue extension 5/5 muscle strength in bilateral upper and left lower extremities bilaterally including strong and equal grip strength and dorsiflexion/plantar flexion. 4/5 muscle strength in RLE which appears baseline per patient and also per chart review.  Sensory to light touch normal in all four extremities. No drift.  Skin: Skin is warm and dry.  Nursing note and vitals reviewed.    ED Treatments / Results  Labs (all labs ordered are listed, but only abnormal results are displayed) Labs Reviewed  CBC WITH DIFFERENTIAL/PLATELET - Abnormal; Notable for the following components:      Result Value   RBC 3.91 (*)    Hemoglobin 11.5 (*)    HCT 36.0 (*)    All other components within normal limits  BASIC METABOLIC PANEL - Abnormal; Notable for the following components:   Glucose, Bld 259 (*)    Creatinine, Ser 1.27 (*)    Calcium 8.6 (*)    All other components within normal limits    EKG  EKG Interpretation None       Radiology Ct Head Wo Contrast  Result Date: 07/13/2017 CLINICAL DATA:  Headache for 2 weeks EXAM: CT HEAD WITHOUT CONTRAST TECHNIQUE: Contiguous  axial images were obtained from the base of the skull through the vertex without intravenous contrast. COMPARISON:  05/20/2017 FINDINGS: Brain: No evidence of acute infarction, hemorrhage, hydrocephalus,  extra-axial collection or mass lesion/mass effect. Vascular: No hyperdense vessel or unexpected calcification. Skull: Normal. Negative for fracture or focal lesion. Sinuses/Orbits: No acute finding. Other: None. IMPRESSION: No acute abnormality noted. Electronically Signed   By: Inez Catalina M.D.   On: 07/13/2017 12:40    Procedures Procedures (including critical care time)  Medications Ordered in ED Medications  tetracaine (PONTOCAINE) 0.5 % ophthalmic solution 2 drop (2 drops Both Eyes Given by Other 07/13/17 1313)  ketorolac (TORADOL) 30 MG/ML injection 30 mg (30 mg Intravenous Given 07/13/17 1549)    And  prochlorperazine (COMPAZINE) injection 10 mg (10 mg Intravenous Given 07/13/17 1544)    And  diphenhydrAMINE (BENADRYL) injection 12.5 mg (12.5 mg Intravenous Given 07/13/17 1547)    And  sodium chloride 0.9 % bolus 500 mL (500 mLs Intravenous New Bag/Given 07/13/17 1554)     Initial Impression / Assessment and Plan / ED Course  I have reviewed the triage vital signs and the nursing notes.  Pertinent labs & imaging results that were available during my care of the patient were reviewed by me and considered in my medical decision making (see chart for details).  Clinical Course as of Jul 13 1634  Sun Jul 13, 4952  2070 47 year old male with poorly controlled diabetes and history of retinal detachment cataracts here with complaint of headache and elevated blood pressure.  He states his baseline blurry vision is more blurry.  There is no history of any trauma.  His toenails on his right foot #3 4 and 5 have come out and there is some bleeding there.  He states his foot is painful similar to when he was admitted for a foot infection in early December.  His eye exam shows an irregular pupil on the right and I am unable to visualize the retina.  The anterior chamber is clear.  Left side does have a circular reactive pupil and able to visualize the retina but vessels are indistinct.  He is getting  some screening labs and a CAT scan and we will check acuity and pressures in the eyes.  [MB]  4132 Plan: Follow up on migraine cocktail results.   [EH]    Clinical Course User Index [EH] Lorin Glass, PA-C [MB] Hayden Rasmussen, MD   Ossie Beltran is a 47 y.o. male who presents to ED for multiple complaints:  1. Right foot pain / lost toe nails on digits 3-5. Denies trauma.  No signs of active infection currently. Given hx of uncontrolled DM and recent admission for cellulitis. Will cover with Keflex.   2. Headache x 3 weeks. Has baseline blurred vision to the right eye. Feels as if this is progressively worsening over the last month. Hypertensive in ED, however appears near baseline per chart review from last office visit. PCP did just increase his BP medications on 1/18. CT head negative. IOP normal. Labs baseline. Received migraine cocktail just prior to shift change. Care assumed by oncoming PA [facility]. Will re-evaluate after migraine cocktail. If improved, likely discharge home. Will follow up with ophtho (referral information included in discharge summary) and PCP.   Patient seen by and discussed with Dr. Melina Copa who agrees with treatment plan.    Final Clinical Impressions(s) / ED Diagnoses   Final diagnoses:  Right foot pain  Essential hypertension  Bad  headache    ED Discharge Orders        Ordered    cephALEXin (KEFLEX) 500 MG capsule  3 times daily     07/13/17 1609       Philip Eckersley, Ozella Almond, PA-C 07/13/17 1636    Hayden Rasmussen, MD 07/15/17 [phone]

## 2017-07-14 ENCOUNTER — Ambulatory Visit: Payer: Self-pay | Attending: Family Medicine

## 2017-07-14 MED FILL — CEPHALEXIN 500 MG CAPSULE: 500 | 7 days supply | Qty: 21 | Fill #0

## 2017-07-14 MED FILL — TRUE METRIX TEST STRIP: 30 days supply | Qty: 100 | Fill #3

## 2017-07-14 MED FILL — GABAPENTIN 300 MG CAPSULE: 300 | 30 days supply | Qty: 60 | Fill #1

## 2017-07-14 MED FILL — ?CARVEDILOL 25 MG TABLET: 25 | 30 days supply | Qty: 60 | Fill #0

## 2017-07-14 NOTE — Telephone Encounter (Signed)
Medication management - Telephone call with patient to inform Dr. Daron Offer wanted him to further decrease Abilify 2 mg to 0.5 tablets a day, 1 mg total until he sees him again on 08/06/17. Pt. agreed with plan and will call if any problems prior to then.

## 2017-07-14 NOTE — Telephone Encounter (Signed)
Yes - that would be fine

## 2017-07-23 ENCOUNTER — Ambulatory Visit (HOSPITAL_COMMUNITY): Payer: Self-pay | Admitting: Licensed Clinical Social Worker

## 2017-07-23 ENCOUNTER — Emergency Department (HOSPITAL_COMMUNITY)
Admission: EM | Admit: 2017-07-23 | Discharge: 2017-07-23 | Disposition: A | Discharge status: Home / Self Care | Payer: Medicaid Other | Attending: Emergency Medicine | Admitting: Emergency Medicine

## 2017-07-23 ENCOUNTER — Encounter (HOSPITAL_COMMUNITY): Payer: Self-pay

## 2017-07-23 DIAGNOSIS — Z7982 Long term (current) use of aspirin: Secondary | ICD-10-CM | POA: Diagnosis not present

## 2017-07-23 DIAGNOSIS — G44209 Tension-type headache, unspecified, not intractable: Secondary | ICD-10-CM | POA: Insufficient documentation

## 2017-07-23 DIAGNOSIS — I5032 Chronic diastolic (congestive) heart failure: Secondary | ICD-10-CM | POA: Diagnosis not present

## 2017-07-23 DIAGNOSIS — N183 Chronic kidney disease, stage 3 (moderate): Secondary | ICD-10-CM | POA: Insufficient documentation

## 2017-07-23 DIAGNOSIS — I130 Hypertensive heart and chronic kidney disease with heart failure and stage 1 through stage 4 chronic kidney disease, or unspecified chronic kidney disease: Secondary | ICD-10-CM | POA: Insufficient documentation

## 2017-07-23 DIAGNOSIS — Z79899 Other long term (current) drug therapy: Secondary | ICD-10-CM | POA: Diagnosis not present

## 2017-07-23 DIAGNOSIS — E1122 Type 2 diabetes mellitus with diabetic chronic kidney disease: Secondary | ICD-10-CM | POA: Insufficient documentation

## 2017-07-23 DIAGNOSIS — I10 Essential (primary) hypertension: Secondary | ICD-10-CM

## 2017-07-23 DIAGNOSIS — R51 Headache: Secondary | ICD-10-CM | POA: Diagnosis present

## 2017-07-23 DIAGNOSIS — Z794 Long term (current) use of insulin: Secondary | ICD-10-CM | POA: Insufficient documentation

## 2017-07-23 DIAGNOSIS — F319 Bipolar disorder, unspecified: Secondary | ICD-10-CM | POA: Diagnosis not present

## 2017-07-23 DIAGNOSIS — Z8673 Personal history of transient ischemic attack (TIA), and cerebral infarction without residual deficits: Secondary | ICD-10-CM | POA: Insufficient documentation

## 2017-07-23 DIAGNOSIS — F419 Anxiety disorder, unspecified: Secondary | ICD-10-CM | POA: Insufficient documentation

## 2017-07-23 LAB — BASIC METABOLIC PANEL
Anion gap: 13 (ref 5–15)
BUN: 16 mg/dL (ref 6–20)
CHLORIDE: 103 mmol/L (ref 101–111)
CO2: 27 mmol/L (ref 22–32)
Calcium: 9.1 mg/dL (ref 8.9–10.3)
Creatinine, Ser: 1.36 mg/dL — ABNORMAL HIGH (ref 0.61–1.24)
GFR calc Af Amer: >60 mL/min (ref >60)
GFR calc non Af Amer: >60 mL/min (ref >60)
GLUCOSE: 97 mg/dL (ref 65–99)
POTASSIUM: 3.6 mmol/L (ref 3.5–5.1)
Sodium: 143 mmol/L (ref 135–145)

## 2017-07-23 LAB — CBC
HEMATOCRIT: 40.3 % (ref 39.0–52.0)
HEMOGLOBIN: 13 g/dL (ref 13.0–17.0)
MCH: 30 pg (ref 26.0–34.0)
MCHC: 32.3 g/dL (ref 30.0–36.0)
MCV: 92.9 fL (ref 78.0–100.0)
Platelets: 235 10*3/uL (ref 150–400)
RBC: 4.34 MIL/uL (ref 4.22–5.81)
RDW: 13.2 % (ref 11.5–15.5)
WBC: 7.3 10*3/uL (ref 4.0–10.5)

## 2017-07-23 LAB — URINALYSIS, ROUTINE W REFLEX MICROSCOPIC
BILIRUBIN URINE: NEGATIVE
Glucose, UA: NEGATIVE mg/dL
KETONES UR: NEGATIVE mg/dL
LEUKOCYTES UA: NEGATIVE
Nitrite: NEGATIVE
PH: 5 (ref 5.0–8.0)
Protein, ur: 100 mg/dL — AB
Specific Gravity, Urine: 1.021 (ref 1.005–1.030)

## 2017-07-23 LAB — CBG MONITORING, ED: Glucose-Capillary: 122 mg/dL — ABNORMAL HIGH (ref 65–99)

## 2017-07-23 MED ORDER — SODIUM CHLORIDE 0.9 % IV BOLUS (SEPSIS)
500.0000 mL | Freq: Once | INTRAVENOUS | Status: AC
  Administered 2017-07-23: 500 mL via INTRAVENOUS

## 2017-07-23 MED ORDER — PROCHLORPERAZINE EDISYLATE 5 MG/ML IJ SOLN
10.0000 mg | Freq: Once | INTRAMUSCULAR | Status: AC
  Administered 2017-07-23: 10 mg via INTRAVENOUS
  Filled 2017-07-23: qty 2

## 2017-07-23 MED ORDER — DIPHENHYDRAMINE HCL 50 MG/ML IJ SOLN
12.5000 mg | Freq: Once | INTRAMUSCULAR | Status: AC
Start: 2017-07-23 — End: 2017-07-23
  Administered 2017-07-23: 12.5 mg via INTRAVENOUS
  Filled 2017-07-23: qty 1

## 2017-07-23 NOTE — ED Notes (Signed)
ED Provider at bedside. 

## 2017-07-23 NOTE — ED Provider Notes (Signed)
[facility] EMERGENCY DEPARTMENT Provider Note   CSN: [id number] Arrival date & time: 07/23/17  0907     History   Chief Complaint No chief complaint on file.   HPI Shane [name] is a 47 y.o. male with past medical history of hypertension, hyperlipidemia, diabetes, who presents to ED for evaluation of headache, R sided blurry vision that has been intermittent for the past 4 weeks. He describes the blurry vision as "fogginess." He was seen and evaluated here approximately a week and a half ago with worsening of his blurry vision and was discharged home with migraine cocktail, negative head CT, negative eye exam.  States that his primary care provider adjusted the dose for his Coreg 2 weeks ago but he continues to have high blood pressure readings at home.  He states that the highest has been 409W systolic and 119 diastolic.  He reports compliance with his home blood pressure and diabetes medications.  He takes ibuprofen intermittently with his last dose last night for his headaches.  He also reports lightheadedness and dizziness while walking.  He denies any head injuries, loss of consciousness, numbness in arms or legs, vomiting, neck pain, fevers, shortness of breath.  HPI  Past Medical History:  Diagnosis Date  . Anemia   . Bipolar disorder ([place])   . Cellulitis 05/2017   RIGHT FOOT   DIABETIC ULCER   . Cellulitis and abscess of left leg 06/2016  . Chest pain    a. 2015 Reportedly normal stress test in [place].  Marland Kitchen Chronic diastolic CHF (congestive heart failure) (HCC)    a.03/2015 Echo: EF 55-60%, Gr 1 DD, mild MR, triv PR.  . Depression   . Depression with anxiety   . Dyspnea    with exertion  . GERD (gastroesophageal reflux disease)   . Glaucoma   . Hyperlipidemia   . Hypertension    a. 08/2014 Admitted with hypertensive urgency.  . Insomnia   . Internal carotid artery stenosis   . Lower GI bleed 07/04/2015  . Neuropathy   . Refusal of blood transfusions as  patient is Jehovah's Witness   . TIA (transient ischemic attack) 08/2014; 03/2015   a. 08/2014 in setting of hypertensive urgency.  . Type II diabetes mellitus ([place])    started insulin spring 2016, Type II  . Vitamin D deficiency spring 2016    Patient Active Problem List   Diagnosis Date Noted  . Bulging lumbar disc 07/04/2017  . Weakness of right lower extremity 05/19/2017  . Cellulitis 05/18/2017  . Achilles tendon contracture, left 01/14/2017  . Gingivitis 11/14/2016  . Achilles tendon contracture, right 09/17/2016  . Pain and swelling of left lower leg 09/16/2016  . CKD (chronic kidney disease) stage 3, GFR 30-59 ml/min (HCC) 08/27/2016  . Dyslipidemia associated with type 2 diabetes mellitus ([place]) 07/22/2016  . Constipation 07/02/2016  . S/P transmetatarsal amputation of foot, left ([place]) 07/02/2016  . Dyspnea on exertion 07/02/2016  . Diabetic gastroparesis ([place]) 05/30/2016  . Buzzing in ear, right 03/26/2016  . Joint pain 03/11/2016  . Weakness 03/08/2016  . Dizziness 03/03/2016  . Chronic diastolic CHF (congestive heart failure), NYHA class 1 ([place]) 12/30/2015  . Diabetes mellitus type 2, uncontrolled, with complications ([place]) 14/78/2956  . Depression with anxiety 12/01/2015  . Pain in the chest 12/01/2015  . Insomnia 11/21/2015  . GERD (gastroesophageal reflux disease) 08/18/2015  . Erectile dysfunction 07/04/2015  . Symptomatic anemia 04/24/2015  . Left arm weakness 04/02/2015  . Sensory  disturbance 04/02/2015  . Essential hypertension 04/02/2015  . Hemispheric carotid artery syndrome   . HLD (hyperlipidemia)   . Headache     Past Surgical History:  Procedure Laterality Date  . AMPUTATION Left 08/03/2015   Procedure: AMPUTATION LEFT GREAT TOE;  Surgeon: Newt Minion, MD;  Location: [facility];  Service: Orthopedics;  Laterality: Left;  . AMPUTATION Left 12/02/2015   Procedure: amputation of left 2nd digit  . AMPUTATION Left 04/10/2016   Procedure: Left 2nd Toe  Amputation at MTP Joint;  Surgeon: Newt Minion, MD;  Location: [facility];  Service: Orthopedics;  Laterality: Left;  . AMPUTATION Left 06/09/2016   Procedure: AMPUTATION LEFT THIRD TOE;  Surgeon: [name], MD;  Location: [facility];  Service: Orthopedics;  Laterality: Left;  . AMPUTATION Left 06/26/2016   Procedure: Left Foot Transmetatarsal Amputation;  Surgeon: Newt Minion, MD;  Location: [facility];  Service: Orthopedics;  Laterality: Left;  . APPLICATION OF WOUND VAC Left 12/19/2015   Procedure: APPLICATION OF WOUND VAC;  Surgeon: Meredith Pel, MD;  Location: [place];  Service: Orthopedics;  Laterality: Left;  . CIRCUMCISION    . COLONOSCOPY N/A 01/22/2016   Procedure: COLONOSCOPY;  Surgeon: Gatha Mayer, MD;  Location: [facility];  Service: Endoscopy;  Laterality: N/A;  . ESOPHAGOGASTRODUODENOSCOPY N/A 01/19/2016   Procedure: ESOPHAGOGASTRODUODENOSCOPY (EGD);  Surgeon: Manus Gunning, MD;  Location: [place];  Service: Gastroenterology;  Laterality: N/A;  . ESOPHAGOGASTRODUODENOSCOPY N/A 01/22/2016   Procedure: ESOPHAGOGASTRODUODENOSCOPY (EGD);  Surgeon: Gatha Mayer, MD;  Location: [facility];  Service: Endoscopy;  Laterality: N/A;  . I&D EXTREMITY Left 12/02/2015   Procedure: IRRIGATION AND DEBRIDEMENT OF FOOT; LEFT SECOND TOE AMPUTATION;  Surgeon: Meredith Pel, MD;  Location: [place];  Service: Orthopedics;  Laterality: Left;  . I&D EXTREMITY Left 12/19/2015   Procedure: I & D LEFT FOOT WITH BEADS ;  Surgeon: Meredith Pel, MD;  Location: [facility];  Service: Orthopedics;  Laterality: Left;  . I&D EXTREMITY Right 01/17/2016   Procedure: IRRIGATION AND DEBRIDEMENT RIGHT FOOT;  Surgeon: Newt Minion, MD;  Location: [facility];  Service: Orthopedics;  Laterality: Right;  . INCISION AND DRAINAGE FOOT Right 01/17/2016  . INGUINAL HERNIA REPAIR Bilateral ~ 1983- ~ 1986  . TONSILLECTOMY  ~ 1985       Home Medications    Prior to Admission medications   Medication Sig Start Date  End Date Taking? Authorizing Provider  amLODipine (NORVASC) 10 MG tablet Take 1 tablet (10 mg total) by mouth daily. 01/20/17   Funches, Adriana Mccallum, MD  ARIPiprazole (ABILIFY) 2 MG tablet TAKE 2 TABLETS (4 MG TOTAL) BY MOUTH DAILY. 05/19/17   Aundra Dubin, MD  aspirin EC 81 MG tablet Take 1 tablet (81 mg total) by mouth daily. 09/16/16   Funches, Adriana Mccallum, MD  atorvastatin (LIPITOR) 80 MG tablet Take 1 tablet (80 mg total) by mouth every morning. 03/03/17   Charlott Rakes, MD  Blood Glucose Monitoring Suppl (TRUE METRIX METER) DEVI 1 each by Does not apply route 3 (three) times daily. 03/17/17   Charlott Rakes, MD  carvedilol (COREG) 25 MG tablet TAKE 1 TABLET BY MOUTH 2 TIMES DAILY WITH A MEAL 07/04/17   Charlott Rakes, MD  cephALEXin (KEFLEX) 500 MG capsule Take 1 capsule (500 mg total) by mouth 3 (three) times daily. 07/13/17   Ward, Ozella Almond, PA-C  Elastic Bandages & Supports (T.E.D. KNEE LENGTH/S-REGULAR) MISC Wear when awake 09/20/16   Joseph Berkshire  J, MD  FLUoxetine (PROZAC) 40 MG capsule TAKE 1 CAPSULE (40 MG TOTAL) BY MOUTH DAILY. 05/19/17   Aundra Dubin, MD  fluticasone (FLONASE) 50 MCG/ACT nasal spray Place 2 sprays into both nostrils daily. 09/01/16   Julianne Rice, MD  folic acid (FOLVITE) 1 MG tablet Take 1 tablet (1 mg total) by mouth daily. Patient not taking: Reported on 07/04/2017 07/04/16   Asencion Partridge, MD  gabapentin (NEURONTIN) 300 MG capsule Take 1 capsule (300 mg total) by mouth 2 (two) times daily. 05/07/17   Charlott Rakes, MD  glipiZIDE (GLUCOTROL XL) 10 MG 24 hr tablet Take 2 tablets (20 mg total) by mouth daily with breakfast. Patient taking differently: Take 10 mg by mouth 2 (two) times daily.  03/03/17   Charlott Rakes, MD  glucose blood (ACCU-CHEK AVIVA PLUS) test strip Use as instructed 02/24/17   Charlott Rakes, MD  glucose blood (TRUE METRIX BLOOD GLUCOSE TEST) test strip 1 each by Other route 3 (three) times daily. 03/17/17   Charlott Rakes, MD    ibuprofen (ADVIL,MOTRIN) 800 MG tablet TAKE 1 TABLET BY MOUTH EVERY 8 HOURS AS NEEDED. Patient taking differently: TAKE 1 TABLET (800mg ) BY MOUTH EVERY 8 HOURS AS NEEDED FOR PAIN 01/10/17   Boykin Nearing, MD  Insulin Glargine (LANTUS SOLOSTAR) 100 UNIT/ML Solostar Pen Inject subcutaneously 30 units in the morning and 15 units in the evening 07/04/17   Charlott Rakes, MD  liraglutide (VICTOZA) 18 MG/3ML SOPN Inject 0.3 mLs (1.8 mg total) into the skin daily with breakfast. Start with 0.1 mls for 1 week then 0.2 mls for 1 week then 0.3 mls thereafter 07/04/17   Charlott Rakes, MD  lisinopril (PRINIVIL,ZESTRIL) 40 MG tablet Take 1 tablet (40 mg total) by mouth daily. 03/03/17   Charlott Rakes, MD  ondansetron (ZOFRAN ODT) 8 MG disintegrating tablet Take 1 tablet (8 mg total) by mouth every 8 (eight) hours as needed for nausea or vomiting. Patient not taking: Reported on 07/13/2017 11/12/16   Boykin Nearing, MD  pantoprazole (PROTONIX) 40 MG tablet Take 1 tablet (40 mg total) by mouth 2 (two) times daily before a meal. 02/14/17   Lemmon, Lavone Nian, PA  polyethylene glycol (MIRALAX / GLYCOLAX) packet Take 17 g by mouth daily as needed for moderate constipation. 07/03/16   Asencion Partridge, MD  senna-docusate (SENOKOT-S) 8.6-50 MG tablet Take 1 tablet by mouth at bedtime. Patient not taking: Reported on 07/04/2017 07/03/16   Asencion Partridge, MD  sucralfate (CARAFATE) 1 g tablet Take 1 tablet (1 g total) by mouth 4 (four) times daily -  with meals and at bedtime. Patient not taking: Reported on 05/18/2017 02/14/17   Levin Erp, PA  traZODone (DESYREL) 100 MG tablet TAKE 2 TABLETS BY MOUTH AT BEDTIME. Patient taking differently: TAKE 2 TABLETS (200 mg)  BY MOUTH AT BEDTIME. 05/26/17   Eksir, Richard Miu, MD  Vitamin D, Ergocalciferol, (DRISDOL) 50000 units CAPS capsule Take 1 capsule (50,000 Units total) by mouth every 30 (thirty) days. Every 30 days Patient not taking: Reported on 05/07/2017  04/26/16   Boykin Nearing, MD    Family History Family History  Problem Relation Age of Onset  . Hypertension Mother   . Diabetes Mother   . Hyperlipidemia Mother   . Heart disease Mother        s/p pacemaker  . Diabetes Father   . Hypertension Father   . Stroke Father   . Heart attack Father        first  MI @ 67.  . Depression Father   . Dementia Father   . Stroke Brother   . ADD / ADHD Brother   . Anxiety disorder Brother   . Bipolar disorder Brother   . OCD Brother   . Sexual abuse Brother     Social History Social History   Tobacco Use  . Smoking status: Never Smoker  . Smokeless tobacco: Never Used  Substance Use Topics  . Alcohol use: No    Alcohol/week: 0.0 oz  . Drug use: No     Allergies   Lactose intolerance (gi); Other; and Milk-related compounds   Review of Systems Review of Systems  Constitutional: Negative for appetite change, chills and fever.  HENT: Negative for ear pain, rhinorrhea, sneezing and sore throat.   Eyes: Positive for visual disturbance. Negative for photophobia.  Respiratory: Negative for cough, chest tightness, shortness of breath and wheezing.   Cardiovascular: Negative for chest pain and palpitations.  Gastrointestinal: Positive for diarrhea. Negative for abdominal pain, blood in stool, constipation, nausea and vomiting.  Genitourinary: Negative for dysuria, hematuria and urgency.  Musculoskeletal: Negative for myalgias.  Skin: Negative for rash.  Neurological: Positive for dizziness, light-headedness and headaches. Negative for weakness.     Physical Exam Updated Vital Signs BP (!) 162/105 (BP Location: Right Arm)   Pulse 84   Temp 97.6 F (36.4 C) (Oral)   Resp 16   SpO2 97%   Physical Exam  Constitutional: He is oriented to person, place, and time. He appears well-developed and well-nourished. No distress.  Nontoxic appearing and in no acute distress.  HENT:  Head: Normocephalic and atraumatic.  Nose: Nose  normal.  Eyes: Conjunctivae and EOM are normal. Right eye exhibits no discharge. Left eye exhibits no discharge. No scleral icterus.  Irregular pupil on R side. L pupil reactive.  Neck: Normal range of motion. Neck supple.  Cardiovascular: Normal rate, regular rhythm, normal heart sounds and intact distal pulses. Exam reveals no gallop and no friction rub.  No murmur heard. Pulmonary/Chest: Effort normal and breath sounds normal. No respiratory distress.  Abdominal: Soft. Bowel sounds are normal. He exhibits no distension. There is no tenderness. There is no guarding.  Musculoskeletal: Normal range of motion. He exhibits no edema.  Neurological: He is alert and oriented to person, place, and time. No cranial nerve deficit or sensory deficit. He exhibits normal muscle tone. Coordination normal.   No facial asymmetry noted. Cranial nerves appear grossly intact. Sensation intact to light touch on face, BUE and BLE. Strength 5/5 in BUE, LLE. Strength slightly decreased in RLE, which appears similar to baseline per chart review. Normal finger to nose coordination bilaterally. L sided toe amputation.  Skin: Skin is warm and dry. No rash noted.  Psychiatric: He has a normal mood and affect.  Nursing note and vitals reviewed.    ED Treatments / Results  Labs (all labs ordered are listed, but only abnormal results are displayed) Labs Reviewed  BASIC METABOLIC PANEL - Abnormal; Notable for the following components:      Result Value   Creatinine, Ser 1.36 (*)    All other components within normal limits  CBC  URINALYSIS, ROUTINE W REFLEX MICROSCOPIC  CBG MONITORING, ED    EKG  EKG Interpretation None       Radiology No results found.  Procedures Procedures (including critical care time)  Medications Ordered in ED Medications  prochlorperazine (COMPAZINE) injection 10 mg (not administered)  diphenhydrAMINE (BENADRYL) injection 12.5 mg (not  administered)  sodium chloride 0.9 %  bolus 500 mL (not administered)     Initial Impression / Assessment and Plan / ED Course  I have reviewed the triage vital signs and the nursing notes.  Pertinent labs & imaging results that were available during my care of the patient were reviewed by me and considered in my medical decision making (see chart for details).     Patient presents to ED for evaluation of headache, intermittent blurry vision for the past 4 weeks. Also reports high BP readings at home. He saw his PCP 2 weeks ago who increased his dose of Coreg. He reports compliance with this. He was seen and evaluated here 1.5 weeks ago for similar symptoms with negative workup, including head CT and improvement with migraine cocktail. He has been taking ibuprofen with no improvement in symptoms. Today he is hypertensive to 162/105, which appears similar to baseline. On physical exam, there is an irregular pupil on the R side that appears similar to baseline, per chart review. Otherwise, no deficits on neurological exam. His cranial nerves appear grossly intact. He denies any neck pain, fevers, numbness in arms or legs, head injuries or vomiting. He appears overall well. Labwork today including CBC, BMP unremarkable with the exception of slight bump in Cr to 1.36, although lower than baseline which appears to be ~1.40. I do not believe there is a need for additional head CT at this time, based on similarity of symptoms to his last visit last week with negative workup. Will obtain UA, give migraine cocktail and reassess.  Care handed off to Dr. Ashok Cordia pending reassessment after medications.  I anticipate disposition home with PCP follow-up if symptoms improve.  Final Clinical Impressions(s) / ED Diagnoses   Final diagnoses:  None    ED Discharge Orders    None       Delia Heady, PA-C 07/23/17 1420    Lajean Saver, MD 07/23/17 1544

## 2017-07-23 NOTE — Discharge Instructions (Addendum)
It was our pleasure to provide your ER care today - we hope that you feel better.  Continue your blood pressure medication as prescribed.  See hypertension instructions, including eating plan to help with hypertension.    Follow up with your doctor in the next couple of days for recheck of blood pressure, and to discuss possible adjustment of blood pressure medication and/or doses.   Return to ER if worse, new symptoms, trouble breathing, other concern.

## 2017-07-23 NOTE — ED Triage Notes (Signed)
Patient reports that his BP has been running high for several weeks. His primary MD altered his coreg for same. Now having intermittent headaches with same, reports mostly frontal. NAD

## 2017-07-23 NOTE — ED Notes (Signed)
Pt made aware that we need to collect a UA when he is able.

## 2017-07-25 ENCOUNTER — Other Ambulatory Visit (HOSPITAL_COMMUNITY): Payer: Self-pay | Admitting: Psychiatry

## 2017-07-25 DIAGNOSIS — F418 Other specified anxiety disorders: Secondary | ICD-10-CM

## 2017-07-25 DIAGNOSIS — F5104 Psychophysiologic insomnia: Secondary | ICD-10-CM

## 2017-07-25 MED FILL — FLUoxetine HCL 40 MG CAPS: 40 | 30 days supply | Qty: 30 | Fill #0

## 2017-07-25 MED FILL — glipiZIDE ER 10 MG TB24: 10 | 30 days supply | Qty: 60 | Fill #2

## 2017-07-25 MED FILL — ATORVASTATIN 80 MG TABLET: 80 | 30 days supply | Qty: 30 | Fill #4

## 2017-07-25 MED FILL — ?PANTOPRAZOLE SOD DR 40MG: 40 MG | 30 days supply | Qty: 60 | Fill #2

## 2017-07-28 MED FILL — !LANTUS SOLOSTAR 100UNITS/M: 100 | 25 days supply | Qty: 15 | Fill #2

## 2017-07-30 ENCOUNTER — Ambulatory Visit (INDEPENDENT_AMBULATORY_CARE_PROVIDER_SITE_OTHER): Payer: Self-pay | Admitting: Orthopedic Surgery

## 2017-07-30 ENCOUNTER — Encounter (INDEPENDENT_AMBULATORY_CARE_PROVIDER_SITE_OTHER): Payer: Self-pay | Admitting: Orthopedic Surgery

## 2017-07-30 VITALS — Ht 67.0 in | Wt 208.0 lb

## 2017-07-30 DIAGNOSIS — Z89432 Acquired absence of left foot: Secondary | ICD-10-CM

## 2017-07-30 DIAGNOSIS — S91209A Unspecified open wound of unspecified toe(s) with damage to nail, initial encounter: Secondary | ICD-10-CM

## 2017-07-30 DIAGNOSIS — M25571 Pain in right ankle and joints of right foot: Secondary | ICD-10-CM

## 2017-07-30 DIAGNOSIS — E1142 Type 2 diabetes mellitus with diabetic polyneuropathy: Secondary | ICD-10-CM

## 2017-07-30 MED FILL — traZODone HCL 100 MG TABS: 100 | 30 days supply | Qty: 60 | Fill #0

## 2017-07-30 NOTE — Progress Notes (Signed)
Office Visit Note   Patient: Shane [name]           Date of Birth: 09-29-1970           MRN: [id number] Visit Date: 07/30/2017              Requested by: Charlott Rakes, MD [place], [place] [address] PCP: Charlott Rakes, MD  Chief Complaint  Patient presents with  . Left Foot - Follow-up    06/26/16 left foot transmet  . Right Foot - Pain      HPI: Patient is a 47 year old gentleman with diabetic insensate neuropathy patient states that he has numbness in the toes of the right foot he states that his left transmetatarsal amputation is doing well.  He states that recently his toenails have fallen off of all of his toes he went to the emergency room he was treated with Keflex.  Patient states that he has headaches and that his blood sugar is sky high.  Patient states that his hemoglobin A1c is around 11.  Assessment & Plan: Visit Diagnoses:  1. S/P transmetatarsal amputation of foot, left ([place])   2. Diabetic polyneuropathy associated with type 2 diabetes mellitus ([place])   3. Nail avulsion of toe, initial encounter     Plan: Recommended following up with primary care for better control of his diabetes and blood pressure.  Recommended antibiotic ointment on the nail beds where the nails have avulsed off.  Discussed that this numbness and nail avulsion is most likely due to his diabetic neuropathy.  Follow-Up Instructions: Return if symptoms worsen or fail to improve.   Ortho Exam  Patient is alert, oriented, no adenopathy, well-dressed, normal affect, normal respiratory effort. Examination patient has a normal gait.  He has a well-healed transmetatarsal amputation of the left.  His foot is plantigrade there are no calluses no ulcers no signs of infection.  Semination the right foot the nails have been avulsed there is good granulation tissue in the wound bed there is no paronychial infection there is no swelling of the toes there is no exposed bone no signs of  infection.  Patient has a palpable dorsalis pedis and posterior tibial pulse.  He has good circulation down to the ankle.  Imaging: No results found. No images are attached to the encounter.  Labs: Lab Results  Component Value Date   HGBA1C 10.1 07/04/2017   HGBA1C 11.4 05/07/2017   HGBA1C 9.4 01/20/2017   ESRSEDRATE 22 (H) 06/24/2016   ESRSEDRATE 27 (H) 06/08/2016   ESRSEDRATE 48 (H) 02/20/2016   CRP <0.8 06/24/2016   CRP 0.5 02/20/2016   CRP 0.8 12/31/2015   REPTSTATUS 06/28/2016 FINAL 06/27/2016   GRAMSTAIN  02/21/2016    RARE WBC PRESENT, PREDOMINANTLY MONONUCLEAR RARE GRAM POSITIVE COCCI IN PAIRS    CULT NO GROWTH 06/27/2016   LABORGA STAPHYLOCOCCUS AUREUS 02/21/2016    @LABSALLVALUES (HGBA1)@  Body mass index is 32.58 kg/m.  Orders:  No orders of the defined types were placed in this encounter.  No orders of the defined types were placed in this encounter.    Procedures: No procedures performed  Clinical Data: No additional findings.  ROS:  All other systems negative, except as noted in the HPI. Review of Systems  Objective: Vital Signs: Ht 5\' 7"  (1.702 m)   Wt 208 lb (94.3 kg)   BMI 32.58 kg/m   Specialty Comments:  No specialty comments available.  PMFS History: Patient Active Problem List   Diagnosis  Date Noted  . Bulging lumbar disc 07/04/2017  . Weakness of right lower extremity 05/19/2017  . Cellulitis 05/18/2017  . Achilles tendon contracture, left 01/14/2017  . Gingivitis 11/14/2016  . Achilles tendon contracture, right 09/17/2016  . Pain and swelling of left lower leg 09/16/2016  . CKD (chronic kidney disease) stage 3, GFR 30-59 ml/min (HCC) 08/27/2016  . Dyslipidemia associated with type 2 diabetes mellitus ([place]) 07/22/2016  . Constipation 07/02/2016  . S/P transmetatarsal amputation of foot, left ([place]) 07/02/2016  . Dyspnea on exertion 07/02/2016  . Diabetic gastroparesis ([place]) 05/30/2016  . Buzzing in ear, right 03/26/2016  .  Joint pain 03/11/2016  . Weakness 03/08/2016  . Dizziness 03/03/2016  . Chronic diastolic CHF (congestive heart failure), NYHA class 1 ([place]) 12/30/2015  . Diabetes mellitus type 2, uncontrolled, with complications ([place]) 50/53/9767  . Depression with anxiety 12/01/2015  . Pain in the chest 12/01/2015  . Insomnia 11/21/2015  . GERD (gastroesophageal reflux disease) 08/18/2015  . Erectile dysfunction 07/04/2015  . Symptomatic anemia 04/24/2015  . Left arm weakness 04/02/2015  . Sensory disturbance 04/02/2015  . Essential hypertension 04/02/2015  . Hemispheric carotid artery syndrome   . HLD (hyperlipidemia)   . Headache    Past Medical History:  Diagnosis Date  . Anemia   . Bipolar disorder ([place])   . Cellulitis 05/2017   RIGHT FOOT   DIABETIC ULCER   . Cellulitis and abscess of left leg 06/2016  . Chest pain    a. 2015 Reportedly normal stress test in [place].  Marland Kitchen Chronic diastolic CHF (congestive heart failure) (HCC)    a.03/2015 Echo: EF 55-60%, Gr 1 DD, mild MR, triv PR.  . Depression   . Depression with anxiety   . Dyspnea    with exertion  . GERD (gastroesophageal reflux disease)   . Glaucoma   . Hyperlipidemia   . Hypertension    a. 08/2014 Admitted with hypertensive urgency.  . Insomnia   . Internal carotid artery stenosis   . Lower GI bleed 07/04/2015  . Neuropathy   . Refusal of blood transfusions as patient is Jehovah's Witness   . TIA (transient ischemic attack) 08/2014; 03/2015   a. 08/2014 in setting of hypertensive urgency.  . Type II diabetes mellitus ([place])    started insulin spring 2016, Type II  . Vitamin D deficiency spring 2016    Family History  Problem Relation Age of Onset  . Hypertension Mother   . Diabetes Mother   . Hyperlipidemia Mother   . Heart disease Mother        s/p pacemaker  . Diabetes Father   . Hypertension Father   . Stroke Father   . Heart attack Father        first MI @ 51.  . Depression Father   . Dementia Father   . Stroke  Brother   . ADD / ADHD Brother   . Anxiety disorder Brother   . Bipolar disorder Brother   . OCD Brother   . Sexual abuse Brother     Past Surgical History:  Procedure Laterality Date  . AMPUTATION Left 08/03/2015   Procedure: AMPUTATION LEFT GREAT TOE;  Surgeon: Newt Minion, MD;  Location: [facility];  Service: Orthopedics;  Laterality: Left;  . AMPUTATION Left 12/02/2015   Procedure: amputation of left 2nd digit  . AMPUTATION Left 04/10/2016   Procedure: Left 2nd Toe Amputation at MTP Joint;  Surgeon: Newt Minion, MD;  Location: [facility];  Service:  Orthopedics;  Laterality: Left;  . AMPUTATION Left 06/09/2016   Procedure: AMPUTATION LEFT THIRD TOE;  Surgeon: [name], MD;  Location: [facility];  Service: Orthopedics;  Laterality: Left;  . AMPUTATION Left 06/26/2016   Procedure: Left Foot Transmetatarsal Amputation;  Surgeon: Newt Minion, MD;  Location: [facility];  Service: Orthopedics;  Laterality: Left;  . APPLICATION OF WOUND VAC Left 12/19/2015   Procedure: APPLICATION OF WOUND VAC;  Surgeon: Meredith Pel, MD;  Location: [facility];  Service: Orthopedics;  Laterality: Left;  . CIRCUMCISION    . COLONOSCOPY N/A 01/22/2016   Procedure: COLONOSCOPY;  Surgeon: Gatha Mayer, MD;  Location: [facility];  Service: Endoscopy;  Laterality: N/A;  . ESOPHAGOGASTRODUODENOSCOPY N/A 01/19/2016   Procedure: ESOPHAGOGASTRODUODENOSCOPY (EGD);  Surgeon: Manus Gunning, MD;  Location: [facility];  Service: Gastroenterology;  Laterality: N/A;  . ESOPHAGOGASTRODUODENOSCOPY N/A 01/22/2016   Procedure: ESOPHAGOGASTRODUODENOSCOPY (EGD);  Surgeon: Gatha Mayer, MD;  Location: [facility] ENDOSCOPY;  Service: Endoscopy;  Laterality: N/A;  . I&D EXTREMITY Left 12/02/2015   Procedure: IRRIGATION AND DEBRIDEMENT OF FOOT; LEFT SECOND TOE AMPUTATION;  Surgeon: Meredith Pel, MD;  Location: [facility];  Service: Orthopedics;  Laterality: Left;  . I&D EXTREMITY Left 12/19/2015   Procedure: I & D LEFT FOOT WITH BEADS ;   Surgeon: Meredith Pel, MD;  Location: [facility];  Service: Orthopedics;  Laterality: Left;  . I&D EXTREMITY Right 01/17/2016   Procedure: IRRIGATION AND DEBRIDEMENT RIGHT FOOT;  Surgeon: Newt Minion, MD;  Location: [facility];  Service: Orthopedics;  Laterality: Right;  . INCISION AND DRAINAGE FOOT Right 01/17/2016  . INGUINAL HERNIA REPAIR Bilateral ~ 1983- ~ 1986  . TONSILLECTOMY  ~ 31   Social History   Occupational History  . Occupation: disabled  Tobacco Use  . Smoking status: Never Smoker  . Smokeless tobacco: Never Used  Substance and Sexual Activity  . Alcohol use: No    Alcohol/week: 0.0 oz  . Drug use: No  . Sexual activity: No

## 2017-07-31 ENCOUNTER — Encounter (HOSPITAL_COMMUNITY): Payer: Self-pay

## 2017-07-31 ENCOUNTER — Emergency Department (HOSPITAL_COMMUNITY)
Admission: EM | Admit: 2017-07-31 | Discharge: 2017-08-01 | Disposition: A | Discharge status: Home / Self Care | Payer: Self-pay | Attending: Emergency Medicine | Admitting: Emergency Medicine

## 2017-07-31 ENCOUNTER — Emergency Department (HOSPITAL_COMMUNITY): Payer: Self-pay

## 2017-07-31 DIAGNOSIS — Z7982 Long term (current) use of aspirin: Secondary | ICD-10-CM | POA: Insufficient documentation

## 2017-07-31 DIAGNOSIS — E11628 Type 2 diabetes mellitus with other skin complications: Secondary | ICD-10-CM

## 2017-07-31 DIAGNOSIS — L03119 Cellulitis of unspecified part of limb: Secondary | ICD-10-CM

## 2017-07-31 DIAGNOSIS — I10 Essential (primary) hypertension: Secondary | ICD-10-CM | POA: Insufficient documentation

## 2017-07-31 DIAGNOSIS — Z794 Long term (current) use of insulin: Secondary | ICD-10-CM | POA: Insufficient documentation

## 2017-07-31 DIAGNOSIS — E119 Type 2 diabetes mellitus without complications: Secondary | ICD-10-CM | POA: Insufficient documentation

## 2017-07-31 DIAGNOSIS — Z79899 Other long term (current) drug therapy: Secondary | ICD-10-CM | POA: Insufficient documentation

## 2017-07-31 DIAGNOSIS — L03115 Cellulitis of right lower limb: Secondary | ICD-10-CM | POA: Insufficient documentation

## 2017-07-31 LAB — CBC WITH DIFFERENTIAL/PLATELET
BASOS ABS: 0.1 10*3/uL (ref 0.0–0.1)
Basophils Relative: 1 %
EOS PCT: 3 %
Eosinophils Absolute: 0.2 10*3/uL (ref 0.0–0.7)
HCT: 39.8 % (ref 39.0–52.0)
Hemoglobin: 12.5 g/dL — ABNORMAL LOW (ref 13.0–17.0)
LYMPHS PCT: 24 %
Lymphs Abs: 1.8 10*3/uL (ref 0.7–4.0)
MCH: 29.3 pg (ref 26.0–34.0)
MCHC: 31.4 g/dL (ref 30.0–36.0)
MCV: 93.2 fL (ref 78.0–100.0)
MONO ABS: 0.3 10*3/uL (ref 0.1–1.0)
Monocytes Relative: 4 %
Neutro Abs: 5.2 10*3/uL (ref 1.7–7.7)
Neutrophils Relative %: 68 %
PLATELETS: 209 10*3/uL (ref 150–400)
RBC: 4.27 MIL/uL (ref 4.22–5.81)
RDW: 13.5 % (ref 11.5–15.5)
WBC: 7.5 10*3/uL (ref 4.0–10.5)

## 2017-07-31 LAB — COMPREHENSIVE METABOLIC PANEL
ALBUMIN: 3.7 g/dL (ref 3.5–5.0)
ALK PHOS: 74 U/L (ref 38–126)
ALT: 41 U/L (ref 17–63)
AST: 29 U/L (ref 15–41)
Anion gap: 9 (ref 5–15)
BUN: 27 mg/dL — AB (ref 6–20)
CALCIUM: 9 mg/dL (ref 8.9–10.3)
CO2: 27 mmol/L (ref 22–32)
Chloride: 102 mmol/L (ref 101–111)
Creatinine, Ser: 1.66 mg/dL — ABNORMAL HIGH (ref 0.61–1.24)
GFR calc Af Amer: 55 mL/min — ABNORMAL LOW (ref >60)
GFR calc non Af Amer: 47 mL/min — ABNORMAL LOW (ref >60)
GLUCOSE: 240 mg/dL — AB (ref 65–99)
POTASSIUM: 4.4 mmol/L (ref 3.5–5.1)
Sodium: 138 mmol/L (ref 135–145)
Total Bilirubin: 0.5 mg/dL (ref 0.3–1.2)
Total Protein: 7.1 g/dL (ref 6.5–8.1)

## 2017-07-31 LAB — URINALYSIS, ROUTINE W REFLEX MICROSCOPIC
BACTERIA UA: NONE SEEN
Bilirubin Urine: NEGATIVE
Glucose, UA: 50 mg/dL — AB
Ketones, ur: NEGATIVE mg/dL
Leukocytes, UA: NEGATIVE
NITRITE: NEGATIVE
PROTEIN: 100 mg/dL — AB
SQUAMOUS EPITHELIAL / LPF: NONE SEEN
Specific Gravity, Urine: 1.027 (ref 1.005–1.030)
pH: 5 (ref 5.0–8.0)

## 2017-07-31 LAB — I-STAT CG4 LACTIC ACID, ED
Lactic Acid, Venous: 1.18 mmol/L (ref 0.5–1.9)
Lactic Acid, Venous: 0.59 mmol/L (ref 0.5–1.9)

## 2017-07-31 MED ORDER — PIPERACILLIN-TAZOBACTAM 3.375 G IVPB 30 MIN: 3.3750 g | Freq: Once | INTRAVENOUS | Status: DC

## 2017-07-31 MED ORDER — LISINOPRIL 20 MG PO TABS
20.0000 mg | ORAL_TABLET | Freq: Once | ORAL | Status: AC
  Administered 2017-07-31: 20 mg via ORAL
  Filled 2017-07-31: qty 1

## 2017-07-31 MED ORDER — VANCOMYCIN HCL IN DEXTROSE 1-5 GM/200ML-% IV SOLN
1000.0000 mg | Freq: Once | INTRAVENOUS | Status: DC
Start: 2017-07-31 — End: 2017-07-31

## 2017-07-31 MED ORDER — CLINDAMYCIN HCL 300 MG PO CAPS: 300.0000 mg | ORAL_CAPSULE | Freq: Three times a day (TID) | ORAL | 0 refills | Status: DC

## 2017-07-31 MED ORDER — CLINDAMYCIN PHOSPHATE 600 MG/50ML IV SOLN
600.0000 mg | Freq: Once | INTRAVENOUS | Status: AC
  Administered 2017-07-31: 600 mg via INTRAVENOUS
  Filled 2017-07-31: qty 50

## 2017-07-31 MED ORDER — AMLODIPINE BESYLATE 5 MG PO TABS
10.0000 mg | ORAL_TABLET | Freq: Once | ORAL | Status: AC
Start: 2017-07-31 — End: 2017-07-31
  Administered 2017-07-31: 10 mg via ORAL
  Filled 2017-07-31: qty 2

## 2017-07-31 MED ORDER — SODIUM CHLORIDE 0.9 % IV BOLUS (SEPSIS)
1000.0000 mL | Freq: Once | INTRAVENOUS | Status: AC
  Administered 2017-07-31: 1000 mL via INTRAVENOUS

## 2017-07-31 NOTE — ED Notes (Signed)
Dr. Darl Householder ( EDP) explained tests results and plan of care to pt.

## 2017-07-31 NOTE — ED Provider Notes (Signed)
[facility] EMERGENCY DEPARTMENT Provider Note   CSN: [id number] Arrival date & time: 07/31/17  1532     History   Chief Complaint No chief complaint on file.   HPI Shane [name] is a 47 y.o. male history of diabetes, hypertension, here presenting with right foot pain and redness.  And right foot pain for the last several months.  Patient was admitted 2 months ago and had a CT of the foot that was unremarkable.  Patient was seen here about a week ago and finished a course of Keflex.  Patient actually saw Dr. Sharol Given yesterday and was prescribed antibiotic ointment for his toe nails. Patient was not prescribed another course of antibiotics.  Patient coming in requesting amputation to the right foot.  Denies any fevers or chills but does have some urinary frequency but no dysuria.  Per Dr. Jess Barters note, patient needs to get better control of his diabetes before getting amputation.       The history is provided by the patient.    Past Medical History:  Diagnosis Date  . Anemia   . Bipolar disorder ([place])   . Cellulitis 05/2017   RIGHT FOOT   DIABETIC ULCER   . Cellulitis and abscess of left leg 06/2016  . Chest pain    a. 2015 Reportedly normal stress test in [place].  Marland Kitchen Chronic diastolic CHF (congestive heart failure) (HCC)    a.03/2015 Echo: EF 55-60%, Gr 1 DD, mild MR, triv PR.  . Depression   . Depression with anxiety   . Dyspnea    with exertion  . GERD (gastroesophageal reflux disease)   . Glaucoma   . Hyperlipidemia   . Hypertension    a. 08/2014 Admitted with hypertensive urgency.  . Insomnia   . Internal carotid artery stenosis   . Lower GI bleed 07/04/2015  . Neuropathy   . Refusal of blood transfusions as patient is Jehovah's Witness   . TIA (transient ischemic attack) 08/2014; 03/2015   a. 08/2014 in setting of hypertensive urgency.  . Type II diabetes mellitus ([place])    started insulin spring 2016, Type II  . Vitamin D deficiency spring 2016     Patient Active Problem List   Diagnosis Date Noted  . Bulging lumbar disc 07/04/2017  . Weakness of right lower extremity 05/19/2017  . Cellulitis 05/18/2017  . Achilles tendon contracture, left 01/14/2017  . Gingivitis 11/14/2016  . Achilles tendon contracture, right 09/17/2016  . Pain and swelling of left lower leg 09/16/2016  . CKD (chronic kidney disease) stage 3, GFR 30-59 ml/min (HCC) 08/27/2016  . Dyslipidemia associated with type 2 diabetes mellitus ([place]) 07/22/2016  . Constipation 07/02/2016  . S/P transmetatarsal amputation of foot, left ([place]) 07/02/2016  . Dyspnea on exertion 07/02/2016  . Diabetic gastroparesis ([place]) 05/30/2016  . Buzzing in ear, right 03/26/2016  . Joint pain 03/11/2016  . Weakness 03/08/2016  . Dizziness 03/03/2016  . Chronic diastolic CHF (congestive heart failure), NYHA class 1 ([place]) 12/30/2015  . Diabetes mellitus type 2, uncontrolled, with complications ([place]) 33/29/5188  . Depression with anxiety 12/01/2015  . Pain in the chest 12/01/2015  . Insomnia 11/21/2015  . GERD (gastroesophageal reflux disease) 08/18/2015  . Erectile dysfunction 07/04/2015  . Symptomatic anemia 04/24/2015  . Left arm weakness 04/02/2015  . Sensory disturbance 04/02/2015  . Essential hypertension 04/02/2015  . Hemispheric carotid artery syndrome   . HLD (hyperlipidemia)   . Headache     Past Surgical History:  Procedure  Laterality Date  . AMPUTATION Left 08/03/2015   Procedure: AMPUTATION LEFT GREAT TOE;  Surgeon: Newt Minion, MD;  Location: [facility];  Service: Orthopedics;  Laterality: Left;  . AMPUTATION Left 12/02/2015   Procedure: amputation of left 2nd digit  . AMPUTATION Left 04/10/2016   Procedure: Left 2nd Toe Amputation at MTP Joint;  Surgeon: Newt Minion, MD;  Location: [facility];  Service: Orthopedics;  Laterality: Left;  . AMPUTATION Left 06/09/2016   Procedure: AMPUTATION LEFT THIRD TOE;  Surgeon: [name], MD;  Location: [facility];  Service:  Orthopedics;  Laterality: Left;  . AMPUTATION Left 06/26/2016   Procedure: Left Foot Transmetatarsal Amputation;  Surgeon: Newt Minion, MD;  Location: [facility];  Service: Orthopedics;  Laterality: Left;  . APPLICATION OF WOUND VAC Left 12/19/2015   Procedure: APPLICATION OF WOUND VAC;  Surgeon: Meredith Pel, MD;  Location: [facility];  Service: Orthopedics;  Laterality: Left;  . CIRCUMCISION    . COLONOSCOPY N/A 01/22/2016   Procedure: COLONOSCOPY;  Surgeon: Gatha Mayer, MD;  Location: [facility];  Service: Endoscopy;  Laterality: N/A;  . ESOPHAGOGASTRODUODENOSCOPY N/A 01/19/2016   Procedure: ESOPHAGOGASTRODUODENOSCOPY (EGD);  Surgeon: Manus Gunning, MD;  Location: [facility];  Service: Gastroenterology;  Laterality: N/A;  . ESOPHAGOGASTRODUODENOSCOPY N/A 01/22/2016   Procedure: ESOPHAGOGASTRODUODENOSCOPY (EGD);  Surgeon: Gatha Mayer, MD;  Location[facility] ENDOSCOPY;  Service: Endoscopy;  Laterality: N/A;  . I&D EXTREMITY Left 12/02/2015   Procedure: IRRIGATION AND DEBRIDEMENT OF FOOT; LEFT SECOND TOE AMPUTATION;  Surgeon: Meredith Pel, MD;  Location: [facility];  Service: Orthopedics;  Laterality: Left;  . I&D EXTREMITY Left 12/19/2015   Procedure: I & D LEFT FOOT WITH BEADS ;  Surgeon: Meredith Pel, MD;  Location: [facility];  Service: Orthopedics;  Laterality: Left;  . I&D EXTREMITY Right 01/17/2016   Procedure: IRRIGATION AND DEBRIDEMENT RIGHT FOOT;  Surgeon: Newt Minion, MD;  Location: [facility];  Service: Orthopedics;  Laterality: Right;  . INCISION AND DRAINAGE FOOT Right 01/17/2016  . INGUINAL HERNIA REPAIR Bilateral ~ 1983- ~ 1986  . TONSILLECTOMY  ~ 1985       Home Medications    Prior to Admission medications   Medication Sig Start Date End Date Taking? Authorizing Provider  amLODipine (NORVASC) 10 MG tablet Take 1 tablet (10 mg total) by mouth daily. 01/20/17   Funches, Adriana Mccallum, MD  ARIPiprazole (ABILIFY) 2 MG tablet TAKE 2 TABLETS (4 MG TOTAL) BY MOUTH DAILY. 05/19/17    Aundra Dubin, MD  aspirin EC 81 MG tablet Take 1 tablet (81 mg total) by mouth daily. 09/16/16   Funches, Adriana Mccallum, MD  atorvastatin (LIPITOR) 80 MG tablet Take 1 tablet (80 mg total) by mouth every morning. 03/03/17   Charlott Rakes, MD  Blood Glucose Monitoring Suppl (TRUE METRIX METER) DEVI 1 each by Does not apply route 3 (three) times daily. 03/17/17   Charlott Rakes, MD  carvedilol (COREG) 25 MG tablet TAKE 1 TABLET BY MOUTH 2 TIMES DAILY WITH A MEAL 07/04/17   Charlott Rakes, MD  cephALEXin (KEFLEX) 500 MG capsule Take 1 capsule (500 mg total) by mouth 3 (three) times daily. 07/13/17   Ward, Ozella Almond, PA-C  Elastic Bandages & Supports (T.E.D. KNEE LENGTH/S-REGULAR) MISC Wear when awake 09/20/16   Pollina, Gwenyth Allegra, MD  FLUoxetine (PROZAC) 40 MG capsule TAKE 1 CAPSULE BY MOUTH DAILY. 07/25/17   Aundra Dubin, MD  fluticasone Asencion Islam) 50 MCG/ACT nasal spray Place 2 sprays  into both nostrils daily. 09/01/16   Julianne Rice, MD  folic acid (FOLVITE) 1 MG tablet Take 1 tablet (1 mg total) by mouth daily. 07/04/16   Asencion Partridge, MD  gabapentin (NEURONTIN) 300 MG capsule Take 1 capsule (300 mg total) by mouth 2 (two) times daily. 05/07/17   Charlott Rakes, MD  glipiZIDE (GLUCOTROL XL) 10 MG 24 hr tablet Take 2 tablets (20 mg total) by mouth daily with breakfast. Patient taking differently: Take 10 mg by mouth 2 (two) times daily.  03/03/17   Charlott Rakes, MD  glucose blood (ACCU-CHEK AVIVA PLUS) test strip Use as instructed 02/24/17   Charlott Rakes, MD  glucose blood (TRUE METRIX BLOOD GLUCOSE TEST) test strip 1 each by Other route 3 (three) times daily. 03/17/17   Charlott Rakes, MD  ibuprofen (ADVIL,MOTRIN) 800 MG tablet TAKE 1 TABLET BY MOUTH EVERY 8 HOURS AS NEEDED. Patient taking differently: TAKE 1 TABLET (800mg ) BY MOUTH EVERY 8 HOURS AS NEEDED FOR PAIN 01/10/17   Boykin Nearing, MD  Insulin Glargine (LANTUS SOLOSTAR) 100 UNIT/ML Solostar Pen Inject subcutaneously  30 units in the morning and 15 units in the evening 07/04/17   Charlott Rakes, MD  liraglutide (VICTOZA) 18 MG/3ML SOPN Inject 0.3 mLs (1.8 mg total) into the skin daily with breakfast. Start with 0.1 mls for 1 week then 0.2 mls for 1 week then 0.3 mls thereafter 07/04/17   Charlott Rakes, MD  lisinopril (PRINIVIL,ZESTRIL) 40 MG tablet Take 1 tablet (40 mg total) by mouth daily. 03/03/17   Charlott Rakes, MD  ondansetron (ZOFRAN ODT) 8 MG disintegrating tablet Take 1 tablet (8 mg total) by mouth every 8 (eight) hours as needed for nausea or vomiting. 11/12/16   Boykin Nearing, MD  pantoprazole (PROTONIX) 40 MG tablet Take 1 tablet (40 mg total) by mouth 2 (two) times daily before a meal. 02/14/17   Lemmon, Lavone Nian, PA  polyethylene glycol (MIRALAX / GLYCOLAX) packet Take 17 g by mouth daily as needed for moderate constipation. 07/03/16   Asencion Partridge, MD  senna-docusate (SENOKOT-S) 8.6-50 MG tablet Take 1 tablet by mouth at bedtime. 07/03/16   Asencion Partridge, MD  sucralfate (CARAFATE) 1 g tablet Take 1 tablet (1 g total) by mouth 4 (four) times daily -  with meals and at bedtime. 02/14/17   Levin Erp, PA  traZODone (DESYREL) 100 MG tablet TAKE 2 TABLETS BY MOUTH AT BEDTIME. Patient taking differently: TAKE 2 TABLETS (200 mg)  BY MOUTH AT BEDTIME. 05/26/17   Eksir, Richard Miu, MD  Vitamin D, Ergocalciferol, (DRISDOL) 50000 units CAPS capsule Take 1 capsule (50,000 Units total) by mouth every 30 (thirty) days. Every 30 days 04/26/16   Boykin Nearing, MD    Family History Family History  Problem Relation Age of Onset  . Hypertension Mother   . Diabetes Mother   . Hyperlipidemia Mother   . Heart disease Mother        s/p pacemaker  . Diabetes Father   . Hypertension Father   . Stroke Father   . Heart attack Father        first MI @ 61.  . Depression Father   . Dementia Father   . Stroke Brother   . ADD / ADHD Brother   . Anxiety disorder Brother   . Bipolar disorder  Brother   . OCD Brother   . Sexual abuse Brother     Social History Social History   Tobacco Use  . Smoking status: Never Smoker  .  Smokeless tobacco: Never Used  Substance Use Topics  . Alcohol use: No    Alcohol/week: 0.0 oz  . Drug use: No     Allergies   Lactose intolerance (gi); Other; and Milk-related compounds   Review of Systems Review of Systems  Skin: Positive for color change.  All other systems reviewed and are negative.    Physical Exam Updated Vital Signs BP (!) 173/101   Pulse 73   Temp 98.1 F (36.7 C) (Oral)   Resp 16   Ht 5\' 7"  (1.702 m)   Wt 90.7 kg (200 lb)   SpO2 96%   BMI 31.32 kg/m   Physical Exam  Constitutional: He appears well-developed.  HENT:  Head: Normocephalic.  Mouth/Throat: Oropharynx is clear and moist.  Eyes: Conjunctivae and EOM are normal. Pupils are equal, round, and reactive to light.  Neck: Normal range of motion. Neck supple.  Cardiovascular: Normal rate, regular rhythm and normal heart sounds.  Pulmonary/Chest: Effort normal and breath sounds normal. No stridor. No respiratory distress.  Abdominal: Soft. Bowel sounds are normal.  Musculoskeletal:  R foot dec sensation of the toes. Missing all toe nails. 2+ pulses. ? Mild erythema of the toes, no purulent discharge. L mid foot amputation   Neurological: He is alert.  Skin: Skin is warm. There is erythema.  Psychiatric: He has a normal mood and affect.  Nursing note and vitals reviewed.    ED Treatments / Results  Labs (all labs ordered are listed, but only abnormal results are displayed) Labs Reviewed  COMPREHENSIVE METABOLIC PANEL - Abnormal; Notable for the following components:      Result Value   Glucose, Bld 240 (*)    BUN 27 (*)    Creatinine, Ser 1.66 (*)    GFR calc non Af Amer 47 (*)    GFR calc Af Amer 55 (*)    All other components within normal limits  CBC WITH DIFFERENTIAL/PLATELET - Abnormal; Notable for the following components:    Hemoglobin 12.5 (*)    All other components within normal limits  URINALYSIS, ROUTINE W REFLEX MICROSCOPIC - Abnormal; Notable for the following components:   APPearance HAZY (*)    Glucose, UA 50 (*)    Hgb urine dipstick SMALL (*)    Protein, ur 100 (*)    All other components within normal limits  CULTURE, BLOOD (ROUTINE X 2)  CULTURE, BLOOD (ROUTINE X 2)  URINE CULTURE  I-STAT CG4 LACTIC ACID, ED  I-STAT CG4 LACTIC ACID, ED    EKG  EKG Interpretation None       Radiology Dg Foot Complete Right  Result Date: 07/31/2017 CLINICAL DATA:  Wound to the top of the right foot since yesterday. EXAM: RIGHT FOOT COMPLETE - 3+ VIEW COMPARISON:  09/17/2016 FINDINGS: No fracture. No subluxation or dislocation. No worrisome lytic or sclerotic osseous abnormality. Dorsal soft tissue swelling noted. No underlying bony destruction evident. IMPRESSION: Negative. Electronically Signed   By: Misty Stanley M.D.   On: 07/31/2017 20:08    Procedures Procedures (including critical care time)  Medications Ordered in ED Medications  sodium chloride 0.9 % bolus 1,000 mL (0 mLs Intravenous Stopped 07/31/17 2030)  clindamycin (CLEOCIN) IVPB 600 mg (0 mg Intravenous Stopped 07/31/17 2113)  amLODipine (NORVASC) tablet 10 mg (10 mg Oral Given 07/31/17 2116)  lisinopril (PRINIVIL,ZESTRIL) tablet 20 mg (20 mg Oral Given 07/31/17 2117)     Initial Impression / Assessment and Plan / ED Course  I have reviewed the  triage vital signs and the nursing notes.  Pertinent labs & imaging results that were available during my care of the patient were reviewed by me and considered in my medical decision making (see chart for details).     Menno Vanbergen is a 47 y.o. male here with R foot erythema, missing toenails. Saw Dr. Sharol Given yesterday and he wants improved diabetes management and didn't put him on antibiotics. Mild erythema which can be from poor blood flow vs early cellulitis. Will get labs, lactate, xrays to r/o  osteo.   10:17 PM WBC nl. Lactate nl. I reviewed Dr. Jess Barters note from yesterday, who doesn't plan to do amputation currently, just antibiotic ointment. Xray showed no osteo. Erythema likely early cellulitis vs decreased blood flow. Will dc home with clindamycin. Will have him follow back up at the internal medicine resident clinic.    Final Clinical Impressions(s) / ED Diagnoses   Final diagnoses:  None    ED Discharge Orders    None       Drenda Freeze, MD 07/31/17 2218

## 2017-07-31 NOTE — ED Notes (Signed)
Patient transported to X-ray 

## 2017-07-31 NOTE — ED Triage Notes (Signed)
Pt reports wound to top of right foot since yesterday and reports great toenail fell off several days ago. Pt is a diabetic and reports he has not been about to feel his toes on the right foot for about 3 weeks. Endorses chills. No fever at this time.

## 2017-07-31 NOTE — Discharge Instructions (Signed)
Take clindamycin three times daily for a week.   Take your blood pressure medicine.   You need to see internal medicine clinic next week to follow up  Return to ER if you have worse leg pain, foot swelling or redness, fever.

## 2017-08-02 LAB — URINE CULTURE: CULTURE: NO GROWTH

## 2017-08-05 LAB — CULTURE, BLOOD (ROUTINE X 2)
CULTURE: NO GROWTH
Culture: NO GROWTH
Special Requests: ADEQUATE
Special Requests: ADEQUATE

## 2017-08-06 ENCOUNTER — Encounter (HOSPITAL_COMMUNITY): Payer: Self-pay | Admitting: Psychiatry

## 2017-08-06 ENCOUNTER — Ambulatory Visit (INDEPENDENT_AMBULATORY_CARE_PROVIDER_SITE_OTHER): Payer: Self-pay | Admitting: Psychiatry

## 2017-08-06 DIAGNOSIS — F5104 Psychophysiologic insomnia: Secondary | ICD-10-CM

## 2017-08-06 DIAGNOSIS — Z818 Family history of other mental and behavioral disorders: Secondary | ICD-10-CM

## 2017-08-06 DIAGNOSIS — F418 Other specified anxiety disorders: Secondary | ICD-10-CM

## 2017-08-06 DIAGNOSIS — Z79899 Other long term (current) drug therapy: Secondary | ICD-10-CM

## 2017-08-06 MED ORDER — FLUOXETINE HCL 40 MG PO CAPS: 40.0000 mg | ORAL_CAPSULE | Freq: Every day | ORAL | 1 refills | Status: AC

## 2017-08-06 MED ORDER — TRAZODONE HCL 100 MG PO TABS: 200.0000 mg | ORAL_TABLET | Freq: Every day | ORAL | 1 refills | Status: DC

## 2017-08-06 MED ORDER — TOPIRAMATE 50 MG PO TABS: ORAL_TABLET | ORAL | 2 refills | Status: DC

## 2017-08-06 MED FILL — TOPIRAMATE 50 MG TABLET: 50 | 30 days supply | Qty: 30 | Fill #0

## 2017-08-06 NOTE — Patient Instructions (Signed)
Continue prozac  START 1/2 tablet topamax and increase to full tablet in 1 week   Try to NOT use trazodone for a few days to see if topamax alone helps  If you need to add trazodone back, that is okay to take 1-2 tablets

## 2017-08-06 NOTE — Progress Notes (Signed)
BH MD/PA/NP OP Progress Note  08/06/2017 2:33 PM Shane [name]  MRN:  [id number]  Chief Complaint: med check [id number] Shane [name] reports that he has discontinued Abilify as of 2 weeks ago as per my chart discussion.  He reports that he has gained around 15-20 pounds on the Abilify, but it was helping with his mood.  We discussed initiating a alternative antipsychotic, but ultimately we agreed that perhaps we can try a different modality of treating insomnia and anxiety symptoms.  He continues on Prozac 40 mg.  We discussed initiation of Topamax nightly in conjunction with trazodone to help with sleep, weight loss associated with antipsychotic, and mood lability/irritability.  He denies any acute safety issues or suicidality.  He does continue to struggle with migraine headaches on a frequent basis, and we are hopeful that Topamax may bring relief to some of the headaches.  He agrees to follow-up in 8-10 weeks or sooner if needed.  We discussed a backup plan to initiate either amitriptyline or nortriptyline nightly for sleep if he continues to have breakthrough depression along with insomnia.  Visit Diagnosis:    ICD-10-CM   1. Psychophysiological insomnia F51.04 traZODone (DESYREL) 100 MG tablet    topiramate (TOPAMAX) 50 MG tablet    FLUoxetine (PROZAC) 40 MG capsule  2. Depression with anxiety F41.8 FLUoxetine (PROZAC) 40 MG capsule    Past Psychiatric History: See intake H&P for full details. Reviewed, with no updates at this time.   Past Medical History:  Past Medical History:  Diagnosis Date  . Anemia   . Bipolar disorder ([place])   . Cellulitis 05/2017   RIGHT FOOT   DIABETIC ULCER   . Cellulitis and abscess of left leg 06/2016  . Chest pain    a. 2015 Reportedly normal stress test in [place].  Marland Kitchen Chronic diastolic CHF (congestive heart failure) (H[facility])    a.03/2015 Echo: EF 55-60%, Gr 1 DD, mild MR, triv PR.  . Depression   . Depression with anxiety   . Dyspnea    with exertion  . GERD  (gastroesophageal reflux disease)   . Glaucoma   . Hyperlipidemia   . Hypertension    a. 08/2014 Admitted with hypertensive urgency.  . Insomnia   . Internal carotid artery stenosis   . Lower GI bleed 07/04/2015  . Neuropathy   . Refusal of blood transfusions as patient is Jehovah's Witness   . TIA (transient ischemic attack) 08/2014; 03/2015   a. 08/2014 in setting of hypertensive urgency.  . Type II diabetes mellitus ([place])    started insulin spring 2016, Type II  . Vitamin D deficiency spring 2016    Past Surgical History:  Procedure Laterality Date  . AMPUTATION Left 08/03/2015   Procedure: AMPUTATION LEFT GREAT TOE;  Surgeon: Newt Minion, MD;  Location: [facility];  Service: Orthopedics;  Laterality: Left;  . AMPUTATION Left 12/02/2015   Procedure: amputation of left 2nd digit  . AMPUTATION Left 04/10/2016   Procedure: Left 2nd Toe Amputation at MTP Joint;  Surgeon: Newt Minion, MD;  Location: [facility];  Service: Orthopedics;  Laterality: Left;  . AMPUTATION Left 06/09/2016   Procedure: AMPUTATION LEFT THIRD TOE;  Surgeon: [name], MD;  Location: [facility];  Service: Orthopedics;  Laterality: Left;  . AMPUTATION Left 06/26/2016   Procedure: Left Foot Transmetatarsal Amputation;  Surgeon: Newt Minion, MD;  Location: [facility];  Service: Orthopedics;  Laterality: Left;  . APPLICATION OF WOUND VAC Left 12/19/2015   Procedure:  APPLICATION OF WOUND VAC;  Surgeon: Meredith Pel, MD;  Location: [facility];  Service: Orthopedics;  Laterality: Left;  . CIRCUMCISION    . COLONOSCOPY N/A 01/22/2016   Procedure: COLONOSCOPY;  Surgeon: Gatha Mayer, MD;  Location: [facility];  Service: Endoscopy;  Laterality: N/A;  . ESOPHAGOGASTRODUODENOSCOPY N/A 01/19/2016   Procedure: ESOPHAGOGASTRODUODENOSCOPY (EGD);  Surgeon: Manus Gunning, MD;  Location: [facility];  Service: Gastroenterology;  Laterality: N/A;  . ESOPHAGOGASTRODUODENOSCOPY N/A 01/22/2016   Procedure: ESOPHAGOGASTRODUODENOSCOPY (EGD);   Surgeon: Gatha Mayer, MD;  Location: [facility];  Service: Endoscopy;  Laterality: N/A;  . I&D EXTREMITY Left 12/02/2015   Procedure: IRRIGATION AND DEBRIDEMENT OF FOOT; LEFT SECOND TOE AMPUTATION;  Surgeon: Meredith Pel, MD;  Location: [facility];  Service: Orthopedics;  Laterality: Left;  . I&D EXTREMITY Left 12/19/2015   Procedure: I & D LEFT FOOT WITH BEADS ;  Surgeon: Meredith Pel, MD;  Location: [facility];  Service: Orthopedics;  Laterality: Left;  . I&D EXTREMITY Right 01/17/2016   Procedure: IRRIGATION AND DEBRIDEMENT RIGHT FOOT;  Surgeon: Newt Minion, MD;  Location: [place];  Service: Orthopedics;  Laterality: Right;  . INCISION AND DRAINAGE FOOT Right 01/17/2016  . INGUINAL HERNIA REPAIR Bilateral ~ 1983- ~ 1986  . TONSILLECTOMY  ~ 61    Family Psychiatric History: See intake H&P for full details. Reviewed, with no updates at this time.\  Family History:  Family History  Problem Relation Age of Onset  . Hypertension Mother   . Diabetes Mother   . Hyperlipidemia Mother   . Heart disease Mother        s/p pacemaker  . Diabetes Father   . Hypertension Father   . Stroke Father   . Heart attack Father        first MI @ 40.  . Depression Father   . Dementia Father   . Stroke Brother   . ADD / ADHD Brother   . Anxiety disorder Brother   . Bipolar disorder Brother   . OCD Brother   . Sexual abuse Brother     Social History:  Social History   Socioeconomic History  . Marital status: Married    Spouse name: None  . Number of children: 1  . Years of education: None  . Highest education level: None  Social Needs  . Financial resource strain: None  . Food insecurity - worry: None  . Food insecurity - inability: None  . Transportation needs - medical: None  . Transportation needs - non-medical: None  Occupational History  . Occupation: disabled  Tobacco Use  . Smoking status: Never Smoker  . Smokeless tobacco: Never Used  Substance and Sexual Activity  .  Alcohol use: No    Alcohol/week: 0.0 oz  . Drug use: No  . Sexual activity: No  Other Topics Concern  . None  Social History Narrative   Lives in [place] with wife.  Active but doesn't routinely exercise.    Allergies:  Allergies  Allergen Reactions  . Lactose Intolerance (Gi) Diarrhea and Other (See Comments)    Bloating (also)  . Other Other (See Comments)    Red meat causes stomach pains, bloating and diarrhea  . Milk-Related Compounds Diarrhea and Other (See Comments)    Any dairy products  - diarrhea and bloating    Metabolic Disorder Labs: Lab Results  Component Value Date   HGBA1C 10.1 07/04/2017   MPG 203 06/08/2016   MPG 177 03/09/2016  No results found for: PROLACTIN Lab Results  Component Value Date   CHOL 137 05/12/2017   TRIG 86 05/12/2017   HDL 41 05/12/2017   CHOLHDL 3.3 05/12/2017   VLDL 16 03/09/2016   LDLCALC 79 05/12/2017   LDLCALC 54 03/09/2016   Lab Results  Component Value Date   TSH 0.408 12/31/2015    Therapeutic Level Labs: No results found for: LITHIUM Lab Results  Component Value Date   VALPROATE 21 (L) 03/08/2016   VALPROATE 19 (L) 04/24/2015   No components found for:  CBMZ  Current Medications: Current Outpatient Medications  Medication Sig Dispense Refill  . amLODipine (NORVASC) 10 MG tablet Take 1 tablet (10 mg total) by mouth daily. 30 tablet 11  . aspirin EC 81 MG tablet Take 1 tablet (81 mg total) by mouth daily. 30 tablet 11  . atorvastatin (LIPITOR) 80 MG tablet Take 1 tablet (80 mg total) by mouth every morning. 30 tablet 5  . Blood Glucose Monitoring Suppl (TRUE METRIX METER) DEVI 1 each by Does not apply route 3 (three) times daily. 1 Device 0  . carvedilol (COREG) 25 MG tablet TAKE 1 TABLET BY MOUTH 2 TIMES DAILY WITH A MEAL 60 tablet 5  . clindamycin (CLEOCIN) 300 MG capsule Take 1 capsule (300 mg total) by mouth 3 (three) times daily. X 7 days 21 capsule 0  . Elastic Bandages & Supports (T.E.D. KNEE  LENGTH/S-REGULAR) MISC Wear when awake 1 each 0  . FLUoxetine (PROZAC) 40 MG capsule Take 1 capsule (40 mg total) by mouth daily. 90 capsule 1  . fluticasone (FLONASE) 50 MCG/ACT nasal spray Place 2 sprays into both nostrils daily. 16 g 0  . folic acid (FOLVITE) 1 MG tablet Take 1 tablet (1 mg total) by mouth daily. 30 tablet 3  . gabapentin (NEURONTIN) 300 MG capsule Take 1 capsule (300 mg total) by mouth 2 (two) times daily. 60 capsule 5  . glipiZIDE (GLUCOTROL XL) 10 MG 24 hr tablet Take 2 tablets (20 mg total) by mouth daily with breakfast. (Patient taking differently: Take 10 mg by mouth 2 (two) times daily. ) 60 tablet 5  . glucose blood (ACCU-CHEK AVIVA PLUS) test strip Use as instructed 100 each 12  . glucose blood (TRUE METRIX BLOOD GLUCOSE TEST) test strip 1 each by Other route 3 (three) times daily. 100 each 12  . ibuprofen (ADVIL,MOTRIN) 800 MG tablet TAKE 1 TABLET BY MOUTH EVERY 8 HOURS AS NEEDED. (Patient taking differently: TAKE 1 TABLET (800mg ) BY MOUTH EVERY 8 HOURS AS NEEDED FOR PAIN) 30 tablet 0  . Insulin Glargine (LANTUS SOLOSTAR) 100 UNIT/ML Solostar Pen Inject subcutaneously 30 units in the morning and 15 units in the evening 5 pen 3  . liraglutide (VICTOZA) 18 MG/3ML SOPN Inject 0.3 mLs (1.8 mg total) into the skin daily with breakfast. Start with 0.1 mls for 1 week then 0.2 mls for 1 week then 0.3 mls thereafter 3 pen 3  . lisinopril (PRINIVIL,ZESTRIL) 40 MG tablet Take 1 tablet (40 mg total) by mouth daily. 30 tablet 5  . ondansetron (ZOFRAN ODT) 8 MG disintegrating tablet Take 1 tablet (8 mg total) by mouth every 8 (eight) hours as needed for nausea or vomiting. 30 tablet 0  . pantoprazole (PROTONIX) 40 MG tablet Take 1 tablet (40 mg total) by mouth 2 (two) times daily before a meal. 60 tablet 3  . polyethylene glycol (MIRALAX / GLYCOLAX) packet Take 17 g by mouth daily as needed for moderate constipation.  14 each 0  . senna-docusate (SENOKOT-S) 8.6-50 MG tablet Take 1  tablet by mouth at bedtime. 60 tablet 2  . traZODone (DESYREL) 100 MG tablet Take 2 tablets (200 mg total) by mouth at bedtime. 180 tablet 1  . Vitamin D, Ergocalciferol, (DRISDOL) 50000 units CAPS capsule Take 1 capsule (50,000 Units total) by mouth every 30 (thirty) days. Every 30 days 12 capsule 1  . sucralfate (CARAFATE) 1 g tablet Take 1 tablet (1 g total) by mouth 4 (four) times daily -  with meals and at bedtime. (Patient not taking: Reported on 08/06/2017) 120 tablet 0  . topiramate (TOPAMAX) 50 MG tablet Start 1/2 tablet nightly for 1 week, then increase to the full tablet 30 tablet 2   No current facility-administered medications for this visit.      Musculoskeletal: Strength & Muscle Tone: within normal limits Gait & Station: normal Patient leans: N/A  Psychiatric Specialty Exam: ROS  Blood pressure 116/78, pulse 82, height 5\' 7"  (1.702 m), weight 204 lb (92.5 kg), SpO2 95 %.Body mass index is 31.95 kg/m.  General Appearance: Casual and Fairly Groomed  Eye Contact:  Fair  Speech:  Clear and Coherent and Normal Rate  Volume:  Normal  Mood:  Dysphoric and Euthymic  Affect:  Appropriate and Congruent  Thought Process:  Goal Directed and Descriptions of Associations: Intact  Orientation:  Full (Time, Place, and Person)  Thought Content: Logical   Suicidal Thoughts:  No  Homicidal Thoughts:  No  Memory:  Immediate;   Fair  Judgement:  Fair  Insight:  Fair  Psychomotor Activity:  Normal  Concentration:  Attention Span: Good  Recall:  Good  Fund of Knowledge: Good  Language: Good  Akathisia:  Negative  Handed:  Right  AIMS (if indicated): not done  Assets:  Communication Skills Desire for Improvement Financial Resources/Insurance Housing  ADL's:  Intact  Cognition: WNL  Sleep:  Fair   Screenings: GAD-7     Office Visit from 07/04/2017 in [facility] Office Visit from 05/07/2017 in [facility] Office Visit  from 01/20/2017 in [facility] Office Visit from 11/12/2016 in [facility] Office Visit from 09/16/2016 in [place] [facility]  Total GAD-7 Score  0  2  0  0  0    PHQ2-9     Office Visit from 07/04/2017 in [facility] Office Visit from 05/07/2017 in [facility] Office Visit from 01/20/2017 in [facility] Office Visit from 11/12/2016 in [facility] Office Visit from 09/16/2016 in [facility]  PHQ-2 Total Score  0  2  3  2  2   PHQ-9 Total Score  5  10  13  12  14       Assessment and Plan:  Thomes Burak presents for medication management follow-up.  He appeared to be having benefit from Abilify in conjunction with Prozac, but the weight gain side effects were unacceptable, especially given his status with diabetes.  We agreed to discontinue Abilify 2 weeks ago, and he presents today for medication follow-up.  We agreed to initiate Topamax nightly to address some of the weight gain side effects from antipsychotic, and hopefully address some of the ongoing breakthrough anxiety symptoms during the day.  We may ultimately consider low-dose try cyclic antidepressant as an  augmenting agent in the future if needed.  No acute safety issues at this time and we will follow-up in 8-10 weeks.  1. Psychophysiological insomnia   2. Depression with anxiety     Status of current problems: stable  Labs Ordered: No orders of the defined types were placed in this encounter.   Labs Reviewed: N/A  Collateral Obtained/Records Reviewed: N/A  Plan:  Prozac 40 mg daily Trazodone 100-200 mg nightly Initiate Topamax 25 mg nightly x 1 week, then increase to 50 mg nightly Consider amitriptyline or nortriptyline for augmentation and insomnia if he does not tolerate Topamax Educated patient on the  risk of Stevens-Johnson syndrome with Topamax Return to clinic in 8-10 weeks Discontinued Abilify 2 weeks ago, we discussed the risks and benefits and considered Rexulti, but will hold off for now  I spent 20 minutes with the patient in direct face-to-face clinical care.  Greater than 50% of this time was spent in counseling and coordination of care with the patient.    Aundra Dubin, MD 08/06/2017, 2:33 PM

## 2017-08-07 ENCOUNTER — Inpatient Hospital Stay (HOSPITAL_COMMUNITY)
Admission: EM | Admit: 2017-08-07 | Discharge: 2017-08-11 | DRG: 638 | Disposition: A | Discharge status: Home / Self Care | Payer: Medicaid Other | Attending: Internal Medicine | Admitting: Internal Medicine

## 2017-08-07 ENCOUNTER — Ambulatory Visit (HOSPITAL_BASED_OUTPATIENT_CLINIC_OR_DEPARTMENT_OTHER): Payer: Medicaid Other | Admitting: Family Medicine

## 2017-08-07 ENCOUNTER — Other Ambulatory Visit: Payer: Self-pay

## 2017-08-07 ENCOUNTER — Encounter: Payer: Self-pay | Admitting: Family Medicine

## 2017-08-07 ENCOUNTER — Emergency Department (HOSPITAL_COMMUNITY): Payer: Medicaid Other

## 2017-08-07 ENCOUNTER — Encounter (HOSPITAL_COMMUNITY): Payer: Self-pay

## 2017-08-07 VITALS — BP 154/98 | HR 80 | Temp 98.0°F | Ht 67.0 in | Wt 205.4 lb

## 2017-08-07 DIAGNOSIS — Z89422 Acquired absence of other left toe(s): Secondary | ICD-10-CM

## 2017-08-07 DIAGNOSIS — E1165 Type 2 diabetes mellitus with hyperglycemia: Secondary | ICD-10-CM

## 2017-08-07 DIAGNOSIS — I10 Essential (primary) hypertension: Secondary | ICD-10-CM | POA: Diagnosis present

## 2017-08-07 DIAGNOSIS — Z9114 Patient's other noncompliance with medication regimen: Secondary | ICD-10-CM | POA: Insufficient documentation

## 2017-08-07 DIAGNOSIS — F418 Other specified anxiety disorders: Secondary | ICD-10-CM | POA: Insufficient documentation

## 2017-08-07 DIAGNOSIS — F319 Bipolar disorder, unspecified: Secondary | ICD-10-CM

## 2017-08-07 DIAGNOSIS — IMO0002 Reserved for concepts with insufficient information to code with codable children: Secondary | ICD-10-CM | POA: Diagnosis present

## 2017-08-07 DIAGNOSIS — I130 Hypertensive heart and chronic kidney disease with heart failure and stage 1 through stage 4 chronic kidney disease, or unspecified chronic kidney disease: Secondary | ICD-10-CM | POA: Diagnosis present

## 2017-08-07 DIAGNOSIS — T368X5A Adverse effect of other systemic antibiotics, initial encounter: Secondary | ICD-10-CM | POA: Diagnosis present

## 2017-08-07 DIAGNOSIS — E11628 Type 2 diabetes mellitus with other skin complications: Secondary | ICD-10-CM | POA: Diagnosis not present

## 2017-08-07 DIAGNOSIS — D631 Anemia in chronic kidney disease: Secondary | ICD-10-CM | POA: Diagnosis present

## 2017-08-07 DIAGNOSIS — K219 Gastro-esophageal reflux disease without esophagitis: Secondary | ICD-10-CM | POA: Insufficient documentation

## 2017-08-07 DIAGNOSIS — L97509 Non-pressure chronic ulcer of other part of unspecified foot with unspecified severity: Secondary | ICD-10-CM

## 2017-08-07 DIAGNOSIS — N179 Acute kidney failure, unspecified: Secondary | ICD-10-CM | POA: Diagnosis present

## 2017-08-07 DIAGNOSIS — E1142 Type 2 diabetes mellitus with diabetic polyneuropathy: Secondary | ICD-10-CM | POA: Diagnosis present

## 2017-08-07 DIAGNOSIS — I110 Hypertensive heart disease with heart failure: Secondary | ICD-10-CM | POA: Insufficient documentation

## 2017-08-07 DIAGNOSIS — Z8673 Personal history of transient ischemic attack (TIA), and cerebral infarction without residual deficits: Secondary | ICD-10-CM

## 2017-08-07 DIAGNOSIS — N183 Chronic kidney disease, stage 3 unspecified: Secondary | ICD-10-CM | POA: Diagnosis present

## 2017-08-07 DIAGNOSIS — G4700 Insomnia, unspecified: Secondary | ICD-10-CM

## 2017-08-07 DIAGNOSIS — L97511 Non-pressure chronic ulcer of other part of right foot limited to breakdown of skin: Secondary | ICD-10-CM | POA: Diagnosis present

## 2017-08-07 DIAGNOSIS — E785 Hyperlipidemia, unspecified: Secondary | ICD-10-CM | POA: Diagnosis present

## 2017-08-07 DIAGNOSIS — E559 Vitamin D deficiency, unspecified: Secondary | ICD-10-CM

## 2017-08-07 DIAGNOSIS — L97411 Non-pressure chronic ulcer of right heel and midfoot limited to breakdown of skin: Secondary | ICD-10-CM

## 2017-08-07 DIAGNOSIS — L03115 Cellulitis of right lower limb: Secondary | ICD-10-CM

## 2017-08-07 DIAGNOSIS — Z794 Long term (current) use of insulin: Secondary | ICD-10-CM

## 2017-08-07 DIAGNOSIS — E1122 Type 2 diabetes mellitus with diabetic chronic kidney disease: Secondary | ICD-10-CM | POA: Diagnosis present

## 2017-08-07 DIAGNOSIS — E11621 Type 2 diabetes mellitus with foot ulcer: Secondary | ICD-10-CM | POA: Insufficient documentation

## 2017-08-07 DIAGNOSIS — F419 Anxiety disorder, unspecified: Secondary | ICD-10-CM | POA: Diagnosis present

## 2017-08-07 DIAGNOSIS — Z79899 Other long term (current) drug therapy: Secondary | ICD-10-CM | POA: Insufficient documentation

## 2017-08-07 DIAGNOSIS — I160 Hypertensive urgency: Secondary | ICD-10-CM | POA: Diagnosis present

## 2017-08-07 DIAGNOSIS — E118 Type 2 diabetes mellitus with unspecified complications: Secondary | ICD-10-CM

## 2017-08-07 DIAGNOSIS — H409 Unspecified glaucoma: Secondary | ICD-10-CM

## 2017-08-07 DIAGNOSIS — Z7982 Long term (current) use of aspirin: Secondary | ICD-10-CM

## 2017-08-07 DIAGNOSIS — I5032 Chronic diastolic (congestive) heart failure: Secondary | ICD-10-CM

## 2017-08-07 DIAGNOSIS — Z89432 Acquired absence of left foot: Secondary | ICD-10-CM | POA: Diagnosis not present

## 2017-08-07 DIAGNOSIS — Z7951 Long term (current) use of inhaled steroids: Secondary | ICD-10-CM

## 2017-08-07 DIAGNOSIS — F5104 Psychophysiologic insomnia: Secondary | ICD-10-CM

## 2017-08-07 LAB — COMPREHENSIVE METABOLIC PANEL
ALBUMIN: 3.6 g/dL (ref 3.5–5.0)
ALT: 18 U/L (ref 17–63)
AST: 18 U/L (ref 15–41)
Alkaline Phosphatase: 74 U/L (ref 38–126)
Anion gap: 8 (ref 5–15)
BUN: 28 mg/dL — AB (ref 6–20)
CHLORIDE: 105 mmol/L (ref 101–111)
CO2: 27 mmol/L (ref 22–32)
CREATININE: 1.58 mg/dL — AB (ref 0.61–1.24)
Calcium: 9.2 mg/dL (ref 8.9–10.3)
GFR calc non Af Amer: 50 mL/min — ABNORMAL LOW (ref >60)
GFR, EST AFRICAN AMERICAN: 58 mL/min — AB (ref >60)
GLUCOSE: 159 mg/dL — AB (ref 65–99)
Potassium: 4.3 mmol/L (ref 3.5–5.1)
SODIUM: 140 mmol/L (ref 135–145)
Total Bilirubin: 0.3 mg/dL (ref 0.3–1.2)
Total Protein: 7.1 g/dL (ref 6.5–8.1)

## 2017-08-07 LAB — CBC WITH DIFFERENTIAL/PLATELET
Basophils Absolute: 0 10*3/uL (ref 0.0–0.1)
Basophils Relative: 0 %
EOS ABS: 0.4 10*3/uL (ref 0.0–0.7)
Eosinophils Relative: 5 %
HCT: 38.6 % — ABNORMAL LOW (ref 39.0–52.0)
HEMOGLOBIN: 12.4 g/dL — AB (ref 13.0–17.0)
Lymphocytes Relative: 21 %
Lymphs Abs: 1.9 10*3/uL (ref 0.7–4.0)
MCH: 29.8 pg (ref 26.0–34.0)
MCHC: 32.1 g/dL (ref 30.0–36.0)
MCV: 92.8 fL (ref 78.0–100.0)
MONOS PCT: 4 %
Monocytes Absolute: 0.4 10*3/uL (ref 0.1–1.0)
NEUTROS PCT: 70 %
Neutro Abs: 6.6 10*3/uL (ref 1.7–7.7)
Platelets: 222 10*3/uL (ref 150–400)
RBC: 4.16 MIL/uL — ABNORMAL LOW (ref 4.22–5.81)
RDW: 13.2 % (ref 11.5–15.5)
WBC: 9.3 10*3/uL (ref 4.0–10.5)

## 2017-08-07 LAB — I-STAT CG4 LACTIC ACID, ED: Lactic Acid, Venous: 1.28 mmol/L (ref 0.5–1.9)

## 2017-08-07 LAB — GLUCOSE, CAPILLARY: GLUCOSE-CAPILLARY: 126 mg/dL — AB (ref 65–99)

## 2017-08-07 LAB — GLUCOSE, POCT (MANUAL RESULT ENTRY): POC Glucose: 158 mg/dl — AB (ref 70–99)

## 2017-08-07 MED ORDER — HYDRALAZINE HCL 20 MG/ML IJ SOLN: 10.0000 mg | INTRAMUSCULAR | Status: DC | PRN

## 2017-08-07 MED ORDER — PIPERACILLIN-TAZOBACTAM 3.375 G IVPB 30 MIN
3.3750 g | Freq: Once | INTRAVENOUS | Status: AC
  Administered 2017-08-07: 3.375 g via INTRAVENOUS
  Filled 2017-08-07: qty 50

## 2017-08-07 MED ORDER — HYDROCODONE-ACETAMINOPHEN 5-325 MG PO TABS
1.0000 | ORAL_TABLET | Freq: Once | ORAL | Status: AC
  Administered 2017-08-07: 1 via ORAL
  Filled 2017-08-07: qty 1

## 2017-08-07 MED ORDER — HYDRALAZINE HCL 20 MG/ML IJ SOLN
10.0000 mg | Freq: Once | INTRAMUSCULAR | Status: AC
  Administered 2017-08-07: 10 mg via INTRAVENOUS
  Filled 2017-08-07: qty 1

## 2017-08-07 NOTE — ED Provider Notes (Addendum)
[facility] EMERGENCY DEPARTMENT Provider Note   CSN: [id number] Arrival date & time: 08/07/17  1150     History   Chief Complaint Chief Complaint  Patient presents with  . Foot Pain    HPI Shane [name] is a 47 y.o. male with past medical history of type 2 diabetes, chronic diastolic CHF, hypertension, hyperlipidemia, CKD, presenting to the ED from PCPs office for cellulitis of the right foot.  Per chart review, patient was recently evaluated in the ED on 07/31/2017, and prescribed clindamycin for mild possible cellulitis versus decreased blood flow.  He was treated prior to that with Keflex, however reported to the ED for persistent pain in the right foot.  Patient evaluated by PCP today, who recommended he report to the ED for diabetic ulcer, with cellulitis and failed outpt abx therapy.  Patient states he has no sensation to his foot midway down extending through to his toes.  States his pain is starts at his midfoot and goes up and through his ankle.  Took ibuprofen at home without much relief of symptoms. Denies fever, however reports associated chills.  States he has been monitoring his sugars as directed, and they have been running in the 120s-130s today.  The history is provided by the patient.    Past Medical History:  Diagnosis Date  . Anemia   . Bipolar disorder ([place])   . Cellulitis 05/2017   RIGHT FOOT   DIABETIC ULCER   . Cellulitis and abscess of left leg 06/2016  . Chest pain    a. 2015 Reportedly normal stress test in [place].  Marland Kitchen Chronic diastolic CHF (congestive heart failure) (HCC)    a.03/2015 Echo: EF 55-60%, Gr 1 DD, mild MR, triv PR.  . Depression   . Depression with anxiety   . Dyspnea    with exertion  . GERD (gastroesophageal reflux disease)   . Glaucoma   . Hyperlipidemia   . Hypertension    a. 08/2014 Admitted with hypertensive urgency.  . Insomnia   . Internal carotid artery stenosis   . Lower GI bleed 07/04/2015  . Neuropathy   .  Refusal of blood transfusions as patient is Jehovah's Witness   . TIA (transient ischemic attack) 08/2014; 03/2015   a. 08/2014 in setting of hypertensive urgency.  . Type II diabetes mellitus ([place])    started insulin spring 2016, Type II  . Vitamin D deficiency spring 2016    Patient Active Problem List   Diagnosis Date Noted  . Bulging lumbar disc 07/04/2017  . Weakness of right lower extremity 05/19/2017  . Cellulitis 05/18/2017  . Achilles tendon contracture, left 01/14/2017  . Gingivitis 11/14/2016  . Achilles tendon contracture, right 09/17/2016  . Pain and swelling of left lower leg 09/16/2016  . CKD (chronic kidney disease) stage 3, GFR 30-59 ml/min (HCC) 08/27/2016  . Dyslipidemia associated with type 2 diabetes mellitus ([place]) 07/22/2016  . Constipation 07/02/2016  . S/P transmetatarsal amputation of foot, left ([place]) 07/02/2016  . Dyspnea on exertion 07/02/2016  . Diabetic gastroparesis ([place]) 05/30/2016  . Buzzing in ear, right 03/26/2016  . Joint pain 03/11/2016  . Weakness 03/08/2016  . Dizziness 03/03/2016  . Chronic diastolic CHF (congestive heart failure), NYHA class 1 ([place]) 12/30/2015  . Diabetes mellitus type 2, uncontrolled, with complications ([place]) 08/65/7846  . Depression with anxiety 12/01/2015  . Pain in the chest 12/01/2015  . Insomnia 11/21/2015  . GERD (gastroesophageal reflux disease) 08/18/2015  . Erectile dysfunction 07/04/2015  .  Symptomatic anemia 04/24/2015  . Left arm weakness 04/02/2015  . Sensory disturbance 04/02/2015  . Essential hypertension 04/02/2015  . Hemispheric carotid artery syndrome   . HLD (hyperlipidemia)   . Headache     Past Surgical History:  Procedure Laterality Date  . AMPUTATION Left 08/03/2015   Procedure: AMPUTATION LEFT GREAT TOE;  Surgeon: Newt Minion, MD;  Location: [facility];  Service: Orthopedics;  Laterality: Left;  . AMPUTATION Left 12/02/2015   Procedure: amputation of left 2nd digit  . AMPUTATION Left 04/10/2016    Procedure: Left 2nd Toe Amputation at MTP Joint;  Surgeon: Newt Minion, MD;  Location: [facility];  Service: Orthopedics;  Laterality: Left;  . AMPUTATION Left 06/09/2016   Procedure: AMPUTATION LEFT THIRD TOE;  Surgeon: [name], MD;  Location: [facility];  Service: Orthopedics;  Laterality: Left;  . AMPUTATION Left 06/26/2016   Procedure: Left Foot Transmetatarsal Amputation;  Surgeon: Newt Minion, MD;  Location: [facility];  Service: Orthopedics;  Laterality: Left;  . APPLICATION OF WOUND VAC Left 12/19/2015   Procedure: APPLICATION OF WOUND VAC;  Surgeon: Meredith Pel, MD;  Location: [facility];  Service: Orthopedics;  Laterality: Left;  . CIRCUMCISION    . COLONOSCOPY N/A 01/22/2016   Procedure: COLONOSCOPY;  Surgeon: Gatha Mayer, MD;  Location: [facility];  Service: Endoscopy;  Laterality: N/A;  . ESOPHAGOGASTRODUODENOSCOPY N/A 01/19/2016   Procedure: ESOPHAGOGASTRODUODENOSCOPY (EGD);  Surgeon: Manus Gunning, MD;  Location: [facility];  Service: Gastroenterology;  Laterality: N/A;  . ESOPHAGOGASTRODUODENOSCOPY N/A 01/22/2016   Procedure: ESOPHAGOGASTRODUODENOSCOPY (EGD);  Surgeon: Gatha Mayer, MD;  Location: [facility] ENDOSCOPY;  Service: Endoscopy;  Laterality: N/A;  . I&D EXTREMITY Left 12/02/2015   Procedure: IRRIGATION AND DEBRIDEMENT OF FOOT; LEFT SECOND TOE AMPUTATION;  Surgeon: Meredith Pel, MD;  Location: [facility];  Service: Orthopedics;  Laterality: Left;  . I&D EXTREMITY Left 12/19/2015   Procedure: I & D LEFT FOOT WITH BEADS ;  Surgeon: Meredith Pel, MD;  Location: [facility];  Service: Orthopedics;  Laterality: Left;  . I&D EXTREMITY Right 01/17/2016   Procedure: IRRIGATION AND DEBRIDEMENT RIGHT FOOT;  Surgeon: Newt Minion, MD;  Location: [facility];  Service: Orthopedics;  Laterality: Right;  . INCISION AND DRAINAGE FOOT Right 01/17/2016  . INGUINAL HERNIA REPAIR Bilateral ~ 1983- ~ 1986  . TONSILLECTOMY  ~ 1985       Home Medications    Prior to Admission medications     Medication Sig Start Date End Date Taking? Authorizing Provider  amLODipine (NORVASC) 10 MG tablet Take 1 tablet (10 mg total) by mouth daily. 01/20/17   Boykin Nearing, MD  aspirin EC 81 MG tablet Take 1 tablet (81 mg total) by mouth daily. 09/16/16   Funches, Adriana Mccallum, MD  atorvastatin (LIPITOR) 80 MG tablet Take 1 tablet (80 mg total) by mouth every morning. 03/03/17   Charlott Rakes, MD  Blood Glucose Monitoring Suppl (TRUE METRIX METER) DEVI 1 each by Does not apply route 3 (three) times daily. 03/17/17   Charlott Rakes, MD  carvedilol (COREG) 25 MG tablet TAKE 1 TABLET BY MOUTH 2 TIMES DAILY WITH A MEAL 07/04/17   Charlott Rakes, MD  clindamycin (CLEOCIN) 300 MG capsule Take 1 capsule (300 mg total) by mouth 3 (three) times daily. X 7 days 07/31/17   Drenda Freeze, MD  Elastic Bandages & Supports (T.E.D. KNEE LENGTH/S-REGULAR) MISC Wear when awake 09/20/16   Pollina, Gwenyth Allegra, MD  FLUoxetine (PROZAC) 40  MG capsule Take 1 capsule (40 mg total) by mouth daily. 08/06/17   Aundra Dubin, MD  fluticasone (FLONASE) 50 MCG/ACT nasal spray Place 2 sprays into both nostrils daily. 09/01/16   Julianne Rice, MD  folic acid (FOLVITE) 1 MG tablet Take 1 tablet (1 mg total) by mouth daily. 07/04/16   Asencion Partridge, MD  gabapentin (NEURONTIN) 300 MG capsule Take 1 capsule (300 mg total) by mouth 2 (two) times daily. 05/07/17   Charlott Rakes, MD  glipiZIDE (GLUCOTROL XL) 10 MG 24 hr tablet Take 2 tablets (20 mg total) by mouth daily with breakfast. Patient taking differently: Take 10 mg by mouth 2 (two) times daily.  03/03/17   Charlott Rakes, MD  glucose blood (ACCU-CHEK AVIVA PLUS) test strip Use as instructed 02/24/17   Charlott Rakes, MD  glucose blood (TRUE METRIX BLOOD GLUCOSE TEST) test strip 1 each by Other route 3 (three) times daily. 03/17/17   Charlott Rakes, MD  ibuprofen (ADVIL,MOTRIN) 800 MG tablet TAKE 1 TABLET BY MOUTH EVERY 8 HOURS AS NEEDED. Patient taking differently: TAKE 1  TABLET (800mg ) BY MOUTH EVERY 8 HOURS AS NEEDED FOR PAIN 01/10/17   Boykin Nearing, MD  Insulin Glargine (LANTUS SOLOSTAR) 100 UNIT/ML Solostar Pen Inject subcutaneously 30 units in the morning and 15 units in the evening 07/04/17   Charlott Rakes, MD  liraglutide (VICTOZA) 18 MG/3ML SOPN Inject 0.3 mLs (1.8 mg total) into the skin daily with breakfast. Start with 0.1 mls for 1 week then 0.2 mls for 1 week then 0.3 mls thereafter 07/04/17   Charlott Rakes, MD  lisinopril (PRINIVIL,ZESTRIL) 40 MG tablet Take 1 tablet (40 mg total) by mouth daily. 03/03/17   Charlott Rakes, MD  ondansetron (ZOFRAN ODT) 8 MG disintegrating tablet Take 1 tablet (8 mg total) by mouth every 8 (eight) hours as needed for nausea or vomiting. 11/12/16   Boykin Nearing, MD  pantoprazole (PROTONIX) 40 MG tablet Take 1 tablet (40 mg total) by mouth 2 (two) times daily before a meal. 02/14/17   Lemmon, Lavone Nian, PA  polyethylene glycol (MIRALAX / GLYCOLAX) packet Take 17 g by mouth daily as needed for moderate constipation. 07/03/16   Asencion Partridge, MD  senna-docusate (SENOKOT-S) 8.6-50 MG tablet Take 1 tablet by mouth at bedtime. 07/03/16   Asencion Partridge, MD  sucralfate (CARAFATE) 1 g tablet Take 1 tablet (1 g total) by mouth 4 (four) times daily -  with meals and at bedtime. Patient not taking: Reported on 08/06/2017 02/14/17   Levin Erp, PA  topiramate (TOPAMAX) 50 MG tablet Start 1/2 tablet nightly for 1 week, then increase to the full tablet 08/06/17   Daron Offer, Richard Miu, MD  traZODone (DESYREL) 100 MG tablet Take 2 tablets (200 mg total) by mouth at bedtime. 08/06/17   Eksir, Richard Miu, MD  Vitamin D, Ergocalciferol, (DRISDOL) 50000 units CAPS capsule Take 1 capsule (50,000 Units total) by mouth every 30 (thirty) days. Every 30 days 04/26/16   Boykin Nearing, MD    Family History Family History  Problem Relation Age of Onset  . Hypertension Mother   . Diabetes Mother   . Hyperlipidemia Mother     . Heart disease Mother        s/p pacemaker  . Diabetes Father   . Hypertension Father   . Stroke Father   . Heart attack Father        first MI @ 83.  . Depression Father   . Dementia Father   .  Stroke Brother   . ADD / ADHD Brother   . Anxiety disorder Brother   . Bipolar disorder Brother   . OCD Brother   . Sexual abuse Brother     Social History Social History   Tobacco Use  . Smoking status: Never Smoker  . Smokeless tobacco: Never Used  Substance Use Topics  . Alcohol use: No    Alcohol/week: 0.0 oz  . Drug use: No     Allergies   Lactose intolerance (gi); Other; and Milk-related compounds   Review of Systems Review of Systems  Constitutional: Positive for chills. Negative for fever.  Musculoskeletal: Positive for myalgias.  Skin: Positive for wound.  All other systems reviewed and are negative.    Physical Exam Updated Vital Signs BP 132/80 (BP Location: Left Arm)   Pulse 72   Temp 98.2 F (36.8 C) (Oral)   Resp 16   Ht 5\' 7"  (1.702 m)   Wt 93 kg (205 lb)   SpO2 98%   BMI 32.11 kg/m   Physical Exam  Constitutional: He appears well-developed and well-nourished. No distress.  Well-appearing  HENT:  Head: Normocephalic and atraumatic.  Eyes: Conjunctivae are normal.  Cardiovascular: Normal rate, regular rhythm, normal heart sounds and intact distal pulses.  Pulmonary/Chest: Effort normal and breath sounds normal. No respiratory distress.  Abdominal: Soft.  Musculoskeletal:  Right foot with ulcerated skin on dorsum of midfoot with what appears to be excoriation.  Right foot is warm with edema, however no significant erythema noted. No purulent drainage or active bleeding.  Skin tear noted to dorsal aspect of right great toe, not actively bleeding, no purulent drainage. All toenails on right foot are avulsed.  Dorsalis pedis and posterior tibialis pulses present Via Doppler.  Toes are warm and pink.  Sensation proximal to wound extending through  toes.  Neurological: He is alert.  Skin: Skin is warm.  Psychiatric: He has a normal mood and affect. His behavior is normal.  Nursing note and vitals reviewed.    ED Treatments / Results  Labs (all labs ordered are listed, but only abnormal results are displayed) Labs Reviewed  COMPREHENSIVE METABOLIC PANEL - Abnormal; Notable for the following components:      Result Value   Glucose, Bld 159 (*)    BUN 28 (*)    Creatinine, Ser 1.58 (*)    GFR calc non Af Amer 50 (*)    GFR calc Af Amer 58 (*)    All other components within normal limits  CBC WITH DIFFERENTIAL/PLATELET - Abnormal; Notable for the following components:   RBC 4.16 (*)    Hemoglobin 12.4 (*)    HCT 38.6 (*)    All other components within normal limits  CULTURE, BLOOD (ROUTINE X 2)  CULTURE, BLOOD (ROUTINE X 2)  I-STAT CG4 LACTIC ACID, ED    EKG  EKG Interpretation None       Radiology Dg Foot Complete Right  Result Date: 08/07/2017 CLINICAL DATA:  Right foot pain for 1 month. EXAM: RIGHT FOOT COMPLETE - 3+ VIEW COMPARISON:  07/31/2017. FINDINGS: There is no evidence of fracture or dislocation. There is no evidence of arthropathy or other focal bone abnormality. Soft tissues are unremarkable. IMPRESSION: Negative. Electronically Signed   By: Misty Stanley M.D.   On: 08/07/2017 18:40    Procedures Procedures (including critical care time)  Medications Ordered in ED Medications  piperacillin-tazobactam (ZOSYN) IVPB 3.375 g (3.375 g Intravenous New Bag/Given 08/07/17 2006)  Initial Impression / Assessment and Plan / ED Course  I have reviewed the triage vital signs and the nursing notes.  Pertinent labs & imaging results that were available during my care of the patient were reviewed by me and considered in my medical decision making (see chart for details).  Clinical Course as of Aug 07 2010  Thu Aug 07, 2017  1857 Pt discussed with Dr. Eulis Foster, who evaluated pt and agrees with admission. Will  order blood cultures and start abx  [JR]    Clinical Course User Index [JR] Cadience Bradfield, [place] N, PA-C   With persistent ulcer and cellulitis to right foot, previously treated with Keflex and then clindamycin without improvement.  Sent here from PCP for hospital admission; pt with poorly controlled type 2 diabetes.  On exam, right foot with ulceration on the dorsum, as well as warmth and edema.  No leukocytosis, patient is nontoxic appearing, afebrile.  Creatinine appears to be at patient's baseline. Xray without evidence of osteomyelitis. Patient evaluated by Dr. Eulis Foster, who also recommends admission.  Blood cultures and antibiotics ordered.  Will consult for admission.   [facility] accepting admission.  The patient appears reasonably stabilized for admission considering the current resources, flow, and capabilities available in the ED at this time, and I doubt any other [facility] requiring further screening and/or treatment in the ED prior to admission.  Final Clinical Impressions(s) / ED Diagnoses   Final diagnoses:  Cellulitis of foot, right  Type 2 diabetes mellitus with foot ulcer, with long-term current use of insulin [facility])    ED Discharge Orders    None       Deagen Krass, [place] N, PA-C 08/07/17 2013    Kanyah Matsushima, [place] N, [place] 08/07/17 2014    Daleen Bo, MD 08/08/17 1121

## 2017-08-07 NOTE — Progress Notes (Signed)
Patient arrived to unit. Alert and oriented x4. Will notify MD for orders. Non-adhesive dressing and gauze applied to R. foot until further assessment.

## 2017-08-07 NOTE — Patient Instructions (Signed)

## 2017-08-07 NOTE — ED Triage Notes (Signed)
Pt endorses right foot infection x 1 month and has been on antibiotics but it has gotten worse over same time. Pt sent by pcp. Pt has DM.

## 2017-08-07 NOTE — ED Notes (Signed)
Pt presents with wound to right foot (present x 1 month) he states that he went to see his primary care doctor and she was concerned that she couldn't feel a pulse in his foot. Pedal and tibial pulses present with doppler. Top portion of his foot is discolored, toe nails are not present, and skin on the top of his foot is granulated with areas of bleeding.

## 2017-08-07 NOTE — Progress Notes (Signed)
Subjective:  Patient ID: Shane [name], male    DOB: 17-Jun-1971  Age: 47 y.o. MRN: [id number]  CC: Hospitalization Follow-up   HPI Shane [name] is a 47 year old male with a history of type 2 diabetes mellitus (A1c 10.1), hypertension, diabetic neuropathy, depression and anxiety who presents today for an ED follow-up complaining of a right dorsal foot ulcer for the last 3 weeks.  Symptoms commenced with his toenails on the right foot falling off with subsequent progression to paresthesias in his right foot and swelling and now with bleeding from the mid point of his dorsum and bleeding will not stop.  Bleeding occurs at a previous surgical incision; he denies fevers. He was seen at the ED 1 week ago and placed on clindamycin and prior to that had been treated with Keflex; right foot x-ray revealed dorsal soft tissue swelling, no bony destruction evident. He was seen by orthopedics - Dr. Sharol Given prior to his ED visit and recommendation was better glycemic control and treatment with antibiotic ointment.  With regards to his diabetes mellitus I had commenced him on Victoza at his last office visit which he never picked up and his spouse informs me that his sugars have been controlled with fasting sugars in the 100s and he had also lost some weight after the Abilify was discontinued by his psychiatrist hence they did not see the need to pick up Victoza.  Past Medical History:  Diagnosis Date  . Anemia   . Bipolar disorder ([place])   . Cellulitis 05/2017   RIGHT FOOT   DIABETIC ULCER   . Cellulitis and abscess of left leg 06/2016  . Chest pain    a. 2015 Reportedly normal stress test in [place].  Marland Kitchen Chronic diastolic CHF (congestive heart failure) (HCC)    a.03/2015 Echo: EF 55-60%, Gr 1 DD, mild MR, triv PR.  . Depression   . Depression with anxiety   . Dyspnea    with exertion  . GERD (gastroesophageal reflux disease)   . Glaucoma   . Hyperlipidemia   . Hypertension    a. 08/2014 Admitted with  hypertensive urgency.  . Insomnia   . Internal carotid artery stenosis   . Lower GI bleed 07/04/2015  . Neuropathy   . Refusal of blood transfusions as patient is Jehov[employer]'s Witness   . TIA (transient ischemic attack) 08/2014; 03/2015   a. 08/2014 in setting of hypertensive urgency.  . Type II diabetes mellitus ([place])    started insulin spring 2016, Type II  . Vitamin D deficiency spring 2016    Past Surgical History:  Procedure Laterality Date  . AMPUTATION Left 08/03/2015   Procedure: AMPUTATION LEFT GREAT TOE;  Surgeon: Newt Minion, MD;  Location: [place];  Service: Orthopedics;  Laterality: Left;  . AMPUTATION Left 12/02/2015   Procedure: amputation of left 2nd digit  . AMPUTATION Left 04/10/2016   Procedure: Left 2nd Toe Amputation at MTP Joint;  Surgeon: Newt Minion, MD;  Location: [place];  Service: Orthopedics;  Laterality: Left;  . AMPUTATION Left 06/09/2016   Procedure: AMPUTATION LEFT THIRD TOE;  Surgeon: [name], MD;  Location: [place];  Service: Orthopedics;  Laterality: Left;  . AMPUTATION Left 06/26/2016   Procedure: Left Foot Transmetatarsal Amputation;  Surgeon: Newt Minion, MD;  Location: [place];  Service: Orthopedics;  Laterality: Left;  . APPLICATION OF WOUND VAC Left 12/19/2015   Procedure: APPLICATION OF WOUND VAC;  Surgeon: Meredith Pel, MD;  Location: [place];  Service: Orthopedics;  Laterality: Left;  . CIRCUMCISION    . COLONOSCOPY N/A 01/22/2016   Procedure: COLONOSCOPY;  Surgeon: Gatha Mayer, MD;  Location: [place];  Service: Endoscopy;  Laterality: N/A;  . ESOPHAGOGASTRODUODENOSCOPY N/A 01/19/2016   Procedure: ESOPHAGOGASTRODUODENOSCOPY (EGD);  Surgeon: Manus Gunning, MD;  Location: [place];  Service: Gastroenterology;  Laterality: N/A;  . ESOPHAGOGASTRODUODENOSCOPY N/A 01/22/2016   Procedure: ESOPHAGOGASTRODUODENOSCOPY (EGD);  Surgeon: Gatha Mayer, MD;  Location[facility] ENDOSCOPY;  Service: Endoscopy;  Laterality: N/A;  . I&D EXTREMITY Left  12/02/2015   Procedure: IRRIGATION AND DEBRIDEMENT OF FOOT; LEFT SECOND TOE AMPUTATION;  Surgeon: Meredith Pel, MD;  Location: [place];  Service: Orthopedics;  Laterality: Left;  . I&D EXTREMITY Left 12/19/2015   Procedure: I & D LEFT FOOT WITH BEADS ;  Surgeon: Meredith Pel, MD;  Location: [place];  Service: Orthopedics;  Laterality: Left;  . I&D EXTREMITY Right 01/17/2016   Procedure: IRRIGATION AND DEBRIDEMENT RIGHT FOOT;  Surgeon: Newt Minion, MD;  Location: [place];  Service: Orthopedics;  Laterality: Right;  . INCISION AND DRAINAGE FOOT Right 01/17/2016  . INGUINAL HERNIA REPAIR Bilateral ~ 1983- ~ 1986  . TONSILLECTOMY  ~ 1985    Allergies  Allergen Reactions  . Lactose Intolerance (Gi) Diarrhea and Other (See Comments)    Bloating (also)  . Other Other (See Comments)    Red meat causes stomach pains, bloating and diarrhea  . Milk-Related Compounds Diarrhea and Other (See Comments)    Any dairy products  - diarrhea and bloating     Outpatient Medications Prior to Visit  Medication Sig Dispense Refill  . amLODipine (NORVASC) 10 MG tablet Take 1 tablet (10 mg total) by mouth daily. 30 tablet 11  . aspirin EC 81 MG tablet Take 1 tablet (81 mg total) by mouth daily. 30 tablet 11  . atorvastatin (LIPITOR) 80 MG tablet Take 1 tablet (80 mg total) by mouth every morning. 30 tablet 5  . Blood Glucose Monitoring Suppl (TRUE METRIX METER) DEVI 1 each by Does not apply route 3 (three) times daily. 1 Device 0  . carvedilol (COREG) 25 MG tablet TAKE 1 TABLET BY MOUTH 2 TIMES DAILY WITH A MEAL 60 tablet 5  . clindamycin (CLEOCIN) 300 MG capsule Take 1 capsule (300 mg total) by mouth 3 (three) times daily. X 7 days 21 capsule 0  . Elastic Bandages & Supports (T.E.D. KNEE LENGTH/S-REGULAR) MISC Wear when awake 1 each 0  . FLUoxetine (PROZAC) 40 MG capsule Take 1 capsule (40 mg total) by mouth daily. 90 capsule 1  . fluticasone (FLONASE) 50 MCG/ACT nasal spray Place 2 sprays into both  nostrils daily. 16 g 0  . folic acid (FOLVITE) 1 MG tablet Take 1 tablet (1 mg total) by mouth daily. 30 tablet 3  . gabapentin (NEURONTIN) 300 MG capsule Take 1 capsule (300 mg total) by mouth 2 (two) times daily. 60 capsule 5  . glipiZIDE (GLUCOTROL XL) 10 MG 24 hr tablet Take 2 tablets (20 mg total) by mouth daily with breakfast. (Patient taking differently: Take 10 mg by mouth 2 (two) times daily. ) 60 tablet 5  . glucose blood (ACCU-CHEK AVIVA PLUS) test strip Use as instructed 100 each 12  . glucose blood (TRUE METRIX BLOOD GLUCOSE TEST) test strip 1 each by Other route 3 (three) times daily. 100 each 12  . ibuprofen (ADVIL,MOTRIN) 800 MG tablet TAKE 1 TABLET BY MOUTH EVERY 8 HOURS AS NEEDED. (Patient taking  differently: TAKE 1 TABLET (800mg ) BY MOUTH EVERY 8 HOURS AS NEEDED FOR PAIN) 30 tablet 0  . Insulin Glargine (LANTUS SOLOSTAR) 100 UNIT/ML Solostar Pen Inject subcutaneously 30 units in the morning and 15 units in the evening 5 pen 3  . liraglutide (VICTOZA) 18 MG/3ML SOPN Inject 0.3 mLs (1.8 mg total) into the skin daily with breakfast. Start with 0.1 mls for 1 week then 0.2 mls for 1 week then 0.3 mls thereafter 3 pen 3  . lisinopril (PRINIVIL,ZESTRIL) 40 MG tablet Take 1 tablet (40 mg total) by mouth daily. 30 tablet 5  . ondansetron (ZOFRAN ODT) 8 MG disintegrating tablet Take 1 tablet (8 mg total) by mouth every 8 (eight) hours as needed for nausea or vomiting. 30 tablet 0  . pantoprazole (PROTONIX) 40 MG tablet Take 1 tablet (40 mg total) by mouth 2 (two) times daily before a meal. 60 tablet 3  . polyethylene glycol (MIRALAX / GLYCOLAX) packet Take 17 g by mouth daily as needed for moderate constipation. 14 each 0  . senna-docusate (SENOKOT-S) 8.6-50 MG tablet Take 1 tablet by mouth at bedtime. 60 tablet 2  . topiramate (TOPAMAX) 50 MG tablet Start 1/2 tablet nightly for 1 week, then increase to the full tablet 30 tablet 2  . traZODone (DESYREL) 100 MG tablet Take 2 tablets (200 mg  total) by mouth at bedtime. 180 tablet 1  . Vitamin D, Ergocalciferol, (DRISDOL) 50000 units CAPS capsule Take 1 capsule (50,000 Units total) by mouth every 30 (thirty) days. Every 30 days 12 capsule 1  . sucralfate (CARAFATE) 1 g tablet Take 1 tablet (1 g total) by mouth 4 (four) times daily -  with meals and at bedtime. (Patient not taking: Reported on 08/06/2017) 120 tablet 0   No facility-administered medications prior to visit.     ROS Review of Systems  Constitutional: Negative for activity change and appetite change.  HENT: Negative for sinus pressure and sore throat.   Eyes: Negative for visual disturbance.  Respiratory: Negative for cough, chest tightness and shortness of breath.   Cardiovascular: Negative for chest pain and leg swelling.  Gastrointestinal: Negative for abdominal distention, abdominal pain, constipation and diarrhea.  Endocrine: Negative.   Genitourinary: Negative for dysuria.  Musculoskeletal:       See hpi  Skin: Positive for wound.  Allergic/Immunologic: Negative.   Neurological: Negative for weakness, light-headedness and numbness.  Psychiatric/Behavioral: Negative for dysphoric mood and suicidal ideas.    Objective:  BP (!) 154/98   Pulse 80   Temp 98 F (36.7 C) (Oral)   Ht 5\' 7"  (1.702 m)   Wt 205 lb 6.4 oz (93.2 kg)   SpO2 98%   BMI 32.17 kg/m   BP/Weight 08/07/2017 08/01/2017 9/56/2130  Systolic BP 865 784 -  Diastolic BP 98 66 -  Wt. (Lbs) 205.4 - 200  BMI 32.17 - 31.32  Some encounter information is confidential and restricted. Go to Review Flowsheets activity to see all data.      Physical Exam Constitutional: He is oriented to person, place, and time. He appears well-developed and well-nourished.  Cardiovascular: Normal rate and normal heart sounds.  No murmur heard. Pulmonary/Chest: Effort normal and breath sounds normal. He has no wheezes. He has no rales. He exhibits no tenderness.  Abdominal: Soft. Bowel sounds are normal. He  exhibits no distension and no mass. There is no tenderness.  Musculoskeletal:  Right foot edema with avulsion of toenails x5 Bleeding at the site of previous vertical  surgical incision on the dorsum. Right tibialis posterior palpable, unable to palpate R dorsalis pedis Paresthesia right foot Left transmetatarsal amputation  Neurological: He is alert and oriented to person, place, and time.    Lab Results  Component Value Date   HGBA1C 10.1 07/04/2017    CMP Latest Ref Rng & Units 07/31/2017 07/23/2017 07/13/2017  Glucose 65 - 99 mg/dL 240(H) 97 259(H)  BUN 6 - 20 mg/dL 27(H) 16 17  Creatinine 0.61 - 1.24 mg/dL 1.66(H) 1.36(H) 1.27(H)  Sodium 135 - 145 mmol/L 138 143 138  Potassium 3.5 - 5.1 mmol/L 4.4 3.6 3.9  Chloride 101 - 111 mmol/L 102 103 105  CO2 22 - 32 mmol/L 27 27 25   Calcium 8.9 - 10.3 mg/dL 9.0 9.1 8.6(L)  Total Protein 6.5 - 8.1 g/dL 7.1 - -  Total Bilirubin 0.3 - 1.2 mg/dL 0.5 - -  Alkaline Phos 38 - 126 U/L 74 - -  AST 15 - 41 U/L 29 - -  ALT 17 - 63 U/L 41 - -      Assessment & Plan:   1. Diabetes mellitus type 2, uncontrolled, with complications ([place]) Uncontrolled with A1c of 10.1 Noncompliant with Victoza which was recently prescribed-patient states blood sugar has been normal Emphasized that he would still need him to pick up the Victoza prescription for optimal glycemic control A1c is due in 09/2017 Continue with other medications and a diabetic diet  - POCT glucose (manual entry)  2. Essential hypertension Uncontrolled - attributes this to pain We will consider adding additional antihypertensive at next visit as he is being referred to the ED now Low-sodium diet    3. Diabetic ulcer of right midfoot associated with type 2 diabetes mellitus, limited to breakdown of skin (HCC) Right foot cellulitis not responding to clindamycin, previously failed Keflex Will need hospitalization as he is at high risk of amputation Referred to the ED stat - MR FOOT  RIGHT W [place]; Future   No orders of the defined types were placed in this encounter.   Follow-up: Return in about 2 weeks (around 08/21/2017) for Follow-up of diabetic foot ulcer.   Charlott Rakes MD

## 2017-08-07 NOTE — ED Notes (Signed)
Patient transported to X-ray 

## 2017-08-07 NOTE — ED Notes (Signed)
ED Provider at bedside. 

## 2017-08-07 NOTE — ED Provider Notes (Signed)
  Face-to-face evaluation   History: He presents for evaluation of persistent right foot swelling, despite being on 2 different antibiotics.  His toenails fell off his right foot about 3 weeks ago, spontaneously, and then his foot began to swell.  He has had some fever and decreased oral intake because of poor appetite.  Physical exam: Right foot tender, swollen, with indistinct redness and skin breakdown over the dorsal forefoot and midfoot.  Toenails right foot all have been avulsed, apparently recently.  No streaking above the midfoot or swelling in the right lower leg.  Sensate right foot.  Medical screening examination/treatment/procedure(s) were conducted as a shared visit with non-physician practitioner(s) and myself.  I personally evaluated the patient during the encounter    Daleen Bo, MD 08/08/17 1123

## 2017-08-07 NOTE — Progress Notes (Signed)
patinet has sore on right foot and toes are also bleeding.

## 2017-08-08 ENCOUNTER — Inpatient Hospital Stay (HOSPITAL_COMMUNITY): Payer: Medicaid Other

## 2017-08-08 ENCOUNTER — Encounter (HOSPITAL_COMMUNITY): Payer: Self-pay | Admitting: Internal Medicine

## 2017-08-08 ENCOUNTER — Other Ambulatory Visit: Payer: Self-pay

## 2017-08-08 DIAGNOSIS — L03115 Cellulitis of right lower limb: Secondary | ICD-10-CM

## 2017-08-08 DIAGNOSIS — I10 Essential (primary) hypertension: Secondary | ICD-10-CM | POA: Diagnosis present

## 2017-08-08 LAB — CBC
HCT: 33.4 % — ABNORMAL LOW (ref 39.0–52.0)
HEMOGLOBIN: 10.6 g/dL — AB (ref 13.0–17.0)
MCH: 29.5 pg (ref 26.0–34.0)
MCHC: 31.7 g/dL (ref 30.0–36.0)
MCV: 93 fL (ref 78.0–100.0)
Platelets: 194 10*3/uL (ref 150–400)
RBC: 3.59 MIL/uL — ABNORMAL LOW (ref 4.22–5.81)
RDW: 13.7 % (ref 11.5–15.5)
WBC: 7.9 10*3/uL (ref 4.0–10.5)

## 2017-08-08 LAB — BASIC METABOLIC PANEL
ANION GAP: 9 (ref 5–15)
BUN: 24 mg/dL — ABNORMAL HIGH (ref 6–20)
CHLORIDE: 106 mmol/L (ref 101–111)
CO2: 25 mmol/L (ref 22–32)
Calcium: 8.6 mg/dL — ABNORMAL LOW (ref 8.9–10.3)
Creatinine, Ser: 1.39 mg/dL — ABNORMAL HIGH (ref 0.61–1.24)
GFR calc non Af Amer: 59 mL/min — ABNORMAL LOW (ref >60)
Glucose, Bld: 163 mg/dL — ABNORMAL HIGH (ref 65–99)
Potassium: 3.9 mmol/L (ref 3.5–5.1)
Sodium: 140 mmol/L (ref 135–145)

## 2017-08-08 LAB — GLUCOSE, CAPILLARY
GLUCOSE-CAPILLARY: 122 mg/dL — AB (ref 65–99)
GLUCOSE-CAPILLARY: 201 mg/dL — AB (ref 65–99)
Glucose-Capillary: 143 mg/dL — ABNORMAL HIGH (ref 65–99)
Glucose-Capillary: 230 mg/dL — ABNORMAL HIGH (ref 65–99)
Glucose-Capillary: 190 mg/dL — ABNORMAL HIGH (ref 65–99)

## 2017-08-08 LAB — SEDIMENTATION RATE: Sed Rate: 50 mm/hr — ABNORMAL HIGH (ref 0–16)

## 2017-08-08 LAB — HIV ANTIBODY (ROUTINE TESTING W REFLEX): HIV Screen 4th Generation wRfx: NONREACTIVE

## 2017-08-08 MED ORDER — OXYCODONE HCL 5 MG PO TABS
5.0000 mg | ORAL_TABLET | Freq: Four times a day (QID) | ORAL | Status: DC | PRN
  Administered 2017-08-08 – 2017-08-09 (×2): 5 mg via ORAL
  Filled 2017-08-08 (×3): qty 1

## 2017-08-08 MED ORDER — TOPIRAMATE 25 MG PO TABS
50.0000 mg | ORAL_TABLET | Freq: Every day | ORAL | Status: DC
  Administered 2017-08-08 – 2017-08-10 (×4): 50 mg via ORAL
  Filled 2017-08-08 (×5): qty 2

## 2017-08-08 MED ORDER — TRAZODONE HCL 100 MG PO TABS
200.0000 mg | ORAL_TABLET | Freq: Every day | ORAL | Status: DC
  Administered 2017-08-08 – 2017-08-10 (×4): 200 mg via ORAL
  Filled 2017-08-08 (×5): qty 2

## 2017-08-08 MED ORDER — AMLODIPINE BESYLATE 10 MG PO TABS
10.0000 mg | ORAL_TABLET | Freq: Every day | ORAL | Status: DC
  Administered 2017-08-08 – 2017-08-11 (×4): 10 mg via ORAL
  Filled 2017-08-08 (×4): qty 1

## 2017-08-08 MED ORDER — ONDANSETRON HCL 4 MG PO TABS: 4.0000 mg | ORAL_TABLET | Freq: Four times a day (QID) | ORAL | Status: DC | PRN

## 2017-08-08 MED ORDER — GABAPENTIN 300 MG PO CAPS
300.0000 mg | ORAL_CAPSULE | Freq: Two times a day (BID) | ORAL | Status: DC
  Administered 2017-08-08 – 2017-08-11 (×8): 300 mg via ORAL
  Filled 2017-08-08 (×8): qty 1

## 2017-08-08 MED ORDER — VANCOMYCIN HCL IN DEXTROSE 1-5 GM/200ML-% IV SOLN
1000.0000 mg | Freq: Two times a day (BID) | INTRAVENOUS | Status: DC
Start: 2017-08-08 — End: 2017-08-08
  Administered 2017-08-08: 1000 mg via INTRAVENOUS
  Filled 2017-08-08: qty 200

## 2017-08-08 MED ORDER — FLUTICASONE PROPIONATE 50 MCG/ACT NA SUSP: 2.0000 | Freq: Every day | NASAL | Status: DC | PRN

## 2017-08-08 MED ORDER — SENNOSIDES-DOCUSATE SODIUM 8.6-50 MG PO TABS
1.0000 | ORAL_TABLET | Freq: Every day | ORAL | Status: DC
  Administered 2017-08-08 – 2017-08-09 (×3): 1 via ORAL
  Filled 2017-08-08 (×4): qty 1

## 2017-08-08 MED ORDER — ATORVASTATIN CALCIUM 80 MG PO TABS
80.0000 mg | ORAL_TABLET | Freq: Every day | ORAL | Status: DC
Start: 2017-08-08 — End: 2017-08-11
  Administered 2017-08-08 – 2017-08-11 (×4): 80 mg via ORAL
  Filled 2017-08-08 (×4): qty 1

## 2017-08-08 MED ORDER — FLUOXETINE HCL 20 MG PO CAPS
40.0000 mg | ORAL_CAPSULE | Freq: Every day | ORAL | Status: DC
  Administered 2017-08-08 – 2017-08-11 (×4): 40 mg via ORAL
  Filled 2017-08-08 (×4): qty 2

## 2017-08-08 MED ORDER — INSULIN GLARGINE 100 UNIT/ML ~~LOC~~ SOLN
30.0000 [IU] | Freq: Every day | SUBCUTANEOUS | Status: DC
  Administered 2017-08-08 – 2017-08-11 (×4): 30 [IU] via SUBCUTANEOUS
  Filled 2017-08-08 (×4): qty 0.3

## 2017-08-08 MED ORDER — VANCOMYCIN HCL 10 G IV SOLR
1500.0000 mg | Freq: Once | INTRAVENOUS | Status: AC
  Administered 2017-08-08: 1500 mg via INTRAVENOUS
  Filled 2017-08-08: qty 1500

## 2017-08-08 MED ORDER — INSULIN ASPART 100 UNIT/ML ~~LOC~~ SOLN
0.0000 [IU] | Freq: Three times a day (TID) | SUBCUTANEOUS | Status: DC
  Administered 2017-08-08: 3 [IU] via SUBCUTANEOUS
  Administered 2017-08-08: 1 [IU] via SUBCUTANEOUS

## 2017-08-08 MED ORDER — POLYETHYLENE GLYCOL 3350 17 G PO PACK: 17.0000 g | PACK | Freq: Every day | ORAL | Status: DC | PRN

## 2017-08-08 MED ORDER — ACETAMINOPHEN 650 MG RE SUPP: 650.0000 mg | Freq: Four times a day (QID) | RECTAL | Status: DC | PRN

## 2017-08-08 MED ORDER — INSULIN ASPART 100 UNIT/ML ~~LOC~~ SOLN: 0.0000 [IU] | Freq: Every day | SUBCUTANEOUS | Status: DC

## 2017-08-08 MED ORDER — PANTOPRAZOLE SODIUM 40 MG PO TBEC
40.0000 mg | DELAYED_RELEASE_TABLET | Freq: Two times a day (BID) | ORAL | Status: DC
  Administered 2017-08-08 – 2017-08-11 (×7): 40 mg via ORAL
  Filled 2017-08-08 (×7): qty 1

## 2017-08-08 MED ORDER — INSULIN ASPART 100 UNIT/ML ~~LOC~~ SOLN
0.0000 [IU] | Freq: Three times a day (TID) | SUBCUTANEOUS | Status: DC
  Administered 2017-08-08: 5 [IU] via SUBCUTANEOUS
  Administered 2017-08-09 – 2017-08-10 (×3): 3 [IU] via SUBCUTANEOUS
  Administered 2017-08-10: 2 [IU] via SUBCUTANEOUS
  Administered 2017-08-10: 3 [IU] via SUBCUTANEOUS
  Administered 2017-08-11: 2 [IU] via SUBCUTANEOUS
  Administered 2017-08-11: 3 [IU] via SUBCUTANEOUS

## 2017-08-08 MED ORDER — LISINOPRIL 40 MG PO TABS
40.0000 mg | ORAL_TABLET | Freq: Every day | ORAL | Status: DC
  Administered 2017-08-08 – 2017-08-09 (×2): 40 mg via ORAL
  Filled 2017-08-08 (×2): qty 1

## 2017-08-08 MED ORDER — ONDANSETRON HCL 4 MG/2ML IJ SOLN: 4.0000 mg | Freq: Four times a day (QID) | INTRAMUSCULAR | Status: DC | PRN

## 2017-08-08 MED ORDER — ACETAMINOPHEN 325 MG PO TABS
650.0000 mg | ORAL_TABLET | Freq: Four times a day (QID) | ORAL | Status: DC | PRN
  Administered 2017-08-08 – 2017-08-11 (×8): 650 mg via ORAL
  Filled 2017-08-08 (×9): qty 2

## 2017-08-08 MED ORDER — INSULIN GLARGINE 100 UNIT/ML ~~LOC~~ SOLN
15.0000 [IU] | Freq: Every day | SUBCUTANEOUS | Status: DC
  Administered 2017-08-08 – 2017-08-10 (×4): 15 [IU] via SUBCUTANEOUS
  Filled 2017-08-08 (×5): qty 0.15

## 2017-08-08 MED ORDER — PIPERACILLIN-TAZOBACTAM 3.375 G IVPB
3.3750 g | Freq: Three times a day (TID) | INTRAVENOUS | Status: DC
  Administered 2017-08-08 – 2017-08-11 (×10): 3.375 g via INTRAVENOUS
  Filled 2017-08-08 (×12): qty 50

## 2017-08-08 MED ORDER — INSULIN GLARGINE 100 UNIT/ML SOLOSTAR PEN: 15.0000 [IU] | PEN_INJECTOR | SUBCUTANEOUS | Status: DC

## 2017-08-08 MED ORDER — FOLIC ACID 1 MG PO TABS
1.0000 mg | ORAL_TABLET | Freq: Every day | ORAL | Status: DC
  Administered 2017-08-08 – 2017-08-11 (×4): 1 mg via ORAL
  Filled 2017-08-08 (×4): qty 1

## 2017-08-08 MED ORDER — CARVEDILOL 25 MG PO TABS
25.0000 mg | ORAL_TABLET | Freq: Two times a day (BID) | ORAL | Status: DC
  Administered 2017-08-08 – 2017-08-11 (×7): 25 mg via ORAL
  Filled 2017-08-08 (×7): qty 1

## 2017-08-08 MED ORDER — VANCOMYCIN HCL 10 G IV SOLR
1250.0000 mg | Freq: Two times a day (BID) | INTRAVENOUS | Status: DC
  Administered 2017-08-08 – 2017-08-09 (×2): 1250 mg via INTRAVENOUS
  Filled 2017-08-08 (×2): qty 1250

## 2017-08-08 NOTE — Progress Notes (Signed)
Patient called to state he lost balance grabbing for his cane and fell on  room floor trying to turn the lights off. Patient says he fell on his knees and hit right foot on the floor causing further bleeding to injured foot. Fall was unwitnessed. On assessment right foot had worsening skin tear on top of foot and big toe was scabbed also with bleeding from toe. Cleansed with normal saline and dressed with bandage. Reinforced to patient that he must call staff before trying to get out of bed. Patient acknowledged understanding. Will inform MD and continue to monitor patient.

## 2017-08-08 NOTE — Consult Note (Addendum)
Hospital Consult    Reason for Consult:  R foot wound Requesting Physician:  Dr. Hal Hope MRN #:  [id number]  History of Present Illness:  This is a 47 y.o. male with past medical history significant for insulin-dependent diabetes mellitus with hemoglobin A1c 9.4 as of 01/2017, chronic kidney disease stage II, hyperlipidemia, and hypertension who has been admitted to the hospital with right foot cellulitis for IV antibiotics having failed outpatient p.o. Antibiotics with Keflex as well as cleocin regimen.  Vascular surgery has been consulted to evaluate for peripheral arterial disease.  Over the past month, the patient has noticed skin changes and all toenails have fallen off R foot.  Surgical history is significant for left transmetatarsal amputation about 1 year prior by Dr. Sharol Given.  He was seen in office by Dr. Sharol Given who recommended better glycemic control.  He denies claudication, rest pain, or trauma to RLE.  He is on aspirin and statin daily.  He denies tobacco use.  Wife expresses her concern as these are the same symptoms patient had on L foot prior to requiring L transmet.  Past Medical History:  Diagnosis Date  . Anemia   . Bipolar disorder ([place])   . Cellulitis 05/2017   RIGHT FOOT   DIABETIC ULCER   . Cellulitis and abscess of left leg 06/2016  . Chest pain    a. 2015 Reportedly normal stress test in [place].  Marland Kitchen Chronic diastolic CHF (congestive heart failure) (HCC)    a.03/2015 Echo: EF 55-60%, Gr 1 DD, mild MR, triv PR.  . Depression   . Depression with anxiety   . Dyspnea    with exertion  . GERD (gastroesophageal reflux disease)   . Glaucoma   . Hyperlipidemia   . Hypertension    a. 08/2014 Admitted with hypertensive urgency.  . Insomnia   . Internal carotid artery stenosis   . Lower GI bleed 07/04/2015  . Neuropathy   . Refusal of blood transfusions as patient is Jehovah's Witness   . TIA (transient ischemic attack) 08/2014; 03/2015   a. 08/2014 in setting of hypertensive  urgency.  . Type II diabetes mellitus ([place])    started insulin spring 2016, Type II  . Vitamin D deficiency spring 2016    Past Surgical History:  Procedure Laterality Date  . AMPUTATION Left 08/03/2015   Procedure: AMPUTATION LEFT GREAT TOE;  Surgeon: Newt Minion, MD;  Location: [place];  Service: Orthopedics;  Laterality: Left;  . AMPUTATION Left 12/02/2015   Procedure: amputation of left 2nd digit  . AMPUTATION Left 04/10/2016   Procedure: Left 2nd Toe Amputation at MTP Joint;  Surgeon: Newt Minion, MD;  Location: [place];  Service: Orthopedics;  Laterality: Left;  . AMPUTATION Left 06/09/2016   Procedure: AMPUTATION LEFT THIRD TOE;  Surgeon: [name], MD;  Location: [place];  Service: Orthopedics;  Laterality: Left;  . AMPUTATION Left 06/26/2016   Procedure: Left Foot Transmetatarsal Amputation;  Surgeon: Newt Minion, MD;  Location: [place];  Service: Orthopedics;  Laterality: Left;  . APPLICATION OF WOUND VAC Left 12/19/2015   Procedure: APPLICATION OF WOUND VAC;  Surgeon: Meredith Pel, MD;  Location: [place];  Service: Orthopedics;  Laterality: Left;  . CIRCUMCISION    . COLONOSCOPY N/A 01/22/2016   Procedure: COLONOSCOPY;  Surgeon: Gatha Mayer, MD;  Location: [place];  Service: Endoscopy;  Laterality: N/A;  . ESOPHAGOGASTRODUODENOSCOPY N/A 01/19/2016   Procedure: ESOPHAGOGASTRODUODENOSCOPY (EGD);  Surgeon: Manus Gunning, MD;  Location: [facility];  Service: Gastroenterology;  Laterality: N/A;  . ESOPHAGOGASTRODUODENOSCOPY N/A 01/22/2016   Procedure: ESOPHAGOGASTRODUODENOSCOPY (EGD);  Surgeon: Gatha Mayer, MD;  Location: [facility];  Service: Endoscopy;  Laterality: N/A;  . I&D EXTREMITY Left 12/02/2015   Procedure: IRRIGATION AND DEBRIDEMENT OF FOOT; LEFT SECOND TOE AMPUTATION;  Surgeon: Meredith Pel, MD;  Location: [place];  Service: Orthopedics;  Laterality: Left;  . I&D EXTREMITY Left 12/19/2015   Procedure: I & D LEFT FOOT WITH BEADS ;  Surgeon: Meredith Pel, MD;  Location: [place];  Service: Orthopedics;  Laterality: Left;  . I&D EXTREMITY Right 01/17/2016   Procedure: IRRIGATION AND DEBRIDEMENT RIGHT FOOT;  Surgeon: Newt Minion, MD;  Location: [place];  Service: Orthopedics;  Laterality: Right;  . INCISION AND DRAINAGE FOOT Right 01/17/2016  . INGUINAL HERNIA REPAIR Bilateral ~ 1983- ~ 1986  . TONSILLECTOMY  ~ 1985    Allergies  Allergen Reactions  . Lactose Intolerance (Gi) Diarrhea and Other (See Comments)    Bloating (also)  . Other Other (See Comments)    Red meat causes stomach pains, bloating and diarrhea  . Milk-Related Compounds Diarrhea and Other (See Comments)    Any dairy products  - diarrhea and bloating    Prior to Admission medications   Medication Sig Start Date End Date Taking? Authorizing Provider  amLODipine (NORVASC) 10 MG tablet Take 1 tablet (10 mg total) by mouth daily. 01/20/17  Yes Funches, Adriana Mccallum, MD  aspirin EC 81 MG tablet Take 1 tablet (81 mg total) by mouth daily. 09/16/16  Yes Funches, Josalyn, MD  atorvastatin (LIPITOR) 80 MG tablet Take 1 tablet (80 mg total) by mouth every morning. Patient taking differently: Take 80 mg by mouth daily.  03/03/17  Yes Charlott Rakes, MD  carvedilol (COREG) 25 MG tablet TAKE 1 TABLET BY MOUTH 2 TIMES DAILY WITH A MEAL Patient taking differently: Take 25 mg by mouth 2 (two) times daily with a meal.  07/04/17  Yes Newlin, Enobong, MD  clindamycin (CLEOCIN) 300 MG capsule Take 1 capsule (300 mg total) by mouth 3 (three) times daily. X 7 days Patient taking differently: Take 300 mg by mouth 3 (three) times daily. 7 day course ordered 07/31/17 07/31/17  Yes Drenda Freeze, MD  FLUoxetine (PROZAC) 40 MG capsule Take 1 capsule (40 mg total) by mouth daily. 08/06/17  Yes Eksir, Richard Miu, MD  fluticasone [facility]) 50 MCG/ACT nasal spray Place 2 sprays into both nostrils daily. Patient taking differently: Place 2 sprays into both nostrils daily as needed for allergies or  rhinitis.  09/01/16  Yes Julianne Rice, MD  folic acid (FOLVITE) 1 MG tablet Take 1 tablet (1 mg total) by mouth daily. 07/04/16  Yes Asencion Partridge, MD  gabapentin (NEURONTIN) 300 MG capsule Take 1 capsule (300 mg total) by mouth 2 (two) times daily. 05/07/17  Yes Charlott Rakes, MD  glipiZIDE (GLUCOTROL XL) 10 MG 24 hr tablet Take 2 tablets (20 mg total) by mouth daily with breakfast. Patient taking differently: Take 10 mg by mouth daily with breakfast.  03/03/17  Yes Newlin, Enobong, MD  ibuprofen (ADVIL,MOTRIN) 800 MG tablet TAKE 1 TABLET BY MOUTH EVERY 8 HOURS AS NEEDED. Patient taking differently: TAKE 1 TABLET (800 MG) BY MOUTH EVERY 8 HOURS AS NEEDED FOR PAIN 01/10/17  Yes Funches, Josalyn, MD  Insulin Glargine (LANTUS SOLOSTAR) 100 UNIT/ML Solostar Pen Inject subcutaneously 30 units in the morning and 15 units in the evening Patient taking differently:  Inject 15-30 Units into the skin See admin instructions. Inject 30 units subcutaneously before breakfast and 15 units at bedtime 07/04/17  Yes Newlin, Enobong, MD  lisinopril (PRINIVIL,ZESTRIL) 40 MG tablet Take 1 tablet (40 mg total) by mouth daily. 03/03/17  Yes Charlott Rakes, MD  ondansetron (ZOFRAN ODT) 8 MG disintegrating tablet Take 1 tablet (8 mg total) by mouth every 8 (eight) hours as needed for nausea or vomiting. 11/12/16  Yes Funches, Josalyn, MD  pantoprazole (PROTONIX) 40 MG tablet Take 1 tablet (40 mg total) by mouth 2 (two) times daily before a meal. 02/14/17  Yes Lemmon, Lavone Nian, PA  polyethylene glycol (MIRALAX / GLYCOLAX) packet Take 17 g by mouth daily as needed for moderate constipation. Patient taking differently: Take 17 g by mouth daily as needed for moderate constipation. Mix in 8 oz liquid and drink 07/03/16  Yes Asencion Partridge, MD  senna-docusate (SENOKOT-S) 8.6-50 MG tablet Take 1 tablet by mouth at bedtime. 07/03/16  Yes Asencion Partridge, MD  topiramate (TOPAMAX) 50 MG tablet Start 1/2 tablet nightly for 1 week, then  increase to the full tablet Patient taking differently: Take 50 mg by mouth at bedtime.  08/06/17  Yes Eksir, Richard Miu, MD  traZODone (DESYREL) 100 MG tablet Take 2 tablets (200 mg total) by mouth at bedtime. 08/06/17  Yes Eksir, Richard Miu, MD  Vitamin D, Ergocalciferol, (DRISDOL) 50000 units CAPS capsule Take 1 capsule (50,000 Units total) by mouth every 30 (thirty) days. Every 30 days Patient taking differently: Take 50,000 Units by mouth See admin instructions. Take one capsule (50000 units) by mouth on the 1st Sunday of each month 04/26/16  Yes Funches, Josalyn, MD  Blood Glucose Monitoring Suppl (TRUE METRIX METER) DEVI 1 each by Does not apply route 3 (three) times daily. 03/17/17   Charlott Rakes, MD  Elastic Bandages & Supports (T.E.D. KNEE LENGTH/S-REGULAR) MISC Wear when awake 09/20/16   Pollina, Gwenyth Allegra, MD  glucose blood (ACCU-CHEK AVIVA PLUS) test strip Use as instructed 02/24/17   Charlott Rakes, MD  glucose blood (TRUE METRIX BLOOD GLUCOSE TEST) test strip 1 each by Other route 3 (three) times daily. 03/17/17   Charlott Rakes, MD  liraglutide (VICTOZA) 18 MG/3ML SOPN Inject 0.3 mLs (1.8 mg total) into the skin daily with breakfast. Start with 0.1 mls for 1 week then 0.2 mls for 1 week then 0.3 mls thereafter Patient taking differently: Inject 0.6 mg into the skin See admin instructions. Order date 07/04/17: Inject  0.1 mls (0.6 mg) subcutaneously daily with breakfast for 1 week, then inject 0.2 mls (1.2 mg) daily with breakfast for 1 week, then inject 0.3 mls (1.8 mg) daily with breakfast thereafter 07/04/17   Charlott Rakes, MD  sucralfate (CARAFATE) 1 g tablet Take 1 tablet (1 g total) by mouth 4 (four) times daily -  with meals and at bedtime. Patient not taking: Reported on 08/06/2017 02/14/17   Levin Erp, PA    Social History   Socioeconomic History  . Marital status: Married    Spouse name: Not on file  . Number of children: 1  . Years of education:  Not on file  . Highest education level: Not on file  Social Needs  . Financial resource strain: Not on file  . Food insecurity - worry: Not on file  . Food insecurity - inability: Not on file  . Transportation needs - medical: Not on file  . Transportation needs - non-medical: Not on file  Occupational History  .  Occupation: disabled  Tobacco Use  . Smoking status: Never Smoker  . Smokeless tobacco: Never Used  Substance and Sexual Activity  . Alcohol use: No    Alcohol/week: 0.0 oz  . Drug use: No  . Sexual activity: No  Other Topics Concern  . Not on file  Social History Narrative   Lives in [place] with wife.  Active but doesn't routinely exercise.     Family History  Problem Relation Age of Onset  . Hypertension Mother   . Diabetes Mother   . Hyperlipidemia Mother   . Heart disease Mother        s/p pacemaker  . Diabetes Father   . Hypertension Father   . Stroke Father   . Heart attack Father        first MI @ 29.  . Depression Father   . Dementia Father   . Stroke Brother   . ADD / ADHD Brother   . Anxiety disorder Brother   . Bipolar disorder Brother   . OCD Brother   . Sexual abuse Brother     ROS: Otherwise negative unless mentioned in HPI  Physical Examination  Vitals:   08/07/17 2303 08/08/17 0452  BP: (!) 152/89 126/76  Pulse: 87 82  Resp: 16 16  Temp: 97.7 F (36.5 C) 97.8 F (36.6 C)  SpO2: 98% 94%   Body mass index is 32.04 kg/m.  General:  WDWN in NAD Gait: Not observed HENT: WNL, normocephalic Pulmonary: normal non-labored breathing, without Rales, rhonchi,  wheezing Cardiac: regular, without  Murmurs, rubs or gallops; without carotid bruits Abdomen:  soft, NT/ND, no masses Skin: without rashes Vascular Exam/Pulses: palpable R DP; faintly palpable R PT, confirmed with brisk PT by doppler; L transmet amp healed without chronic wounds Extremities: without ischemic changes, without Gangrene , with cellulitis; superficial wound dorsum  R Musculoskeletal: no muscle wasting or atrophy  Neurologic: A&O X 3;  No focal weakness or paresthesias are detected; speech is fluent/normal Psychiatric:  The pt has Normal affect. Lymph:  Unremarkable  CBC    Component Value Date/Time   WBC 7.9 08/08/2017 0704   RBC 3.59 (L) 08/08/2017 0704   HGB 10.6 (L) 08/08/2017 0704   HGB 10.6 (L) 01/20/2017 1032   HGB 9.8 (L) 02/09/2016 1030   HCT 33.4 (L) 08/08/2017 0704   HCT 32.4 (L) 01/20/2017 1032   HCT 30.7 (L) 02/09/2016 1030   PLT 194 08/08/2017 0704   PLT 301 01/20/2017 1032   MCV 93.0 08/08/2017 0704   MCV 93 01/20/2017 1032   MCV 94.5 02/09/2016 1030   MCH 29.5 08/08/2017 0704   MCHC 31.7 08/08/2017 0704   RDW 13.7 08/08/2017 0704   RDW 13.1 01/20/2017 1032   RDW 14.3 02/09/2016 1030   LYMPHSABS 1.9 08/07/2017 1234   LYMPHSABS 1.9 02/09/2016 1030   MONOABS 0.4 08/07/2017 1234   MONOABS 0.4 02/09/2016 1030   EOSABS 0.4 08/07/2017 1234   EOSABS 0.3 02/09/2016 1030   BASOSABS 0.0 08/07/2017 1234   BASOSABS 0.1 02/09/2016 1030    BMET    Component Value Date/Time   NA 140 08/08/2017 0704   NA 146 (H) 05/12/2017 1003   K 3.9 08/08/2017 0704   CL 106 08/08/2017 0704   CO2 25 08/08/2017 0704   GLUCOSE 163 (H) 08/08/2017 0704   BUN 24 (H) 08/08/2017 0704   BUN 16 05/12/2017 1003   CREATININE 1.39 (H) 08/08/2017 0704   CREATININE 1.79 (H) 08/02/2016 1409   CALCIUM  8.6 (L) 08/08/2017 0704   GFRNONAA 59 (L) 08/08/2017 0704   GFRNONAA 44 (L) 08/02/2016 1409   GFRAA >60 08/08/2017 0704   GFRAA 51 (L) 08/02/2016 1409    COAGS: Lab Results  Component Value Date   INR 1.08 03/08/2016   INR 1.06 01/16/2016   INR 1.13 12/10/2015     Non-Invasive Vascular Imaging:   ABIs wnl with triphasic signals as of 11/2015  Statin:  Yes.   Beta Blocker:  Yes.   Aspirin:  Yes.   ACEI:  Yes.   ARB:  No. CCB use:  Yes Other antiplatelets/anticoagulants:  No.    ASSESSMENT/PLAN: This is a 47 y.o. male admitted to hospital  due to cellulitis R foot  Palpable R DP and faintly palpable PT despite skin changes R foot Nail avulsion likely related to neuropathy Agree with need for better glycemic and HTN control Agree with IV antibiotics Check ABIs however unlikely there will be any indication for revascularization   Dagoberto Ligas PA-C [facility] [phone]   I have independently interviewed and examined patient and agree with PA assessment and plan above. Unsure of cause of current wounds and loss of toenails but he does have a readily palpable dp and can hopefully heal with blood glucose control and wound care. Check ABI with toe pressures.   Jasie Meleski C. Donzetta Matters, MD [facility] [place] Office: [phone] Pager: [phone]

## 2017-08-08 NOTE — Progress Notes (Signed)
Pharmacy Antibiotic Note  Shane [name] is a 47 y.o. male admitted on 08/07/2017 with cellulitis.  MRI unable to rule out osteomyelitis.  Pharmacy has been consulted for Vancocin and Zosyn dosing.  Patient's renal function is improving.  Afebrile, WBC WNL, LA 1.28.  Plan: Change vanc to 1250mg  IV Q12H Zosyn EID 3.375gm IV Q8H Monitor renal fxn, clinical progress, vanc trough at C[facility]   Height: 5\' 7"  (170.2 cm) Weight: 204 lb 9.4 oz (92.8 kg) IBW/kg (Calculated) : 66.1  Temp (24hrs), Avg:98 F (36.7 C), Min:97.7 F (36.5 C), Max:98.2 F (36.8 C)  Recent Labs  Lab 08/07/17 1234 08/07/17 1319 08/08/17 0704  WBC 9.3  --  7.9  CREATININE 1.58*  --  1.39*  LATICACIDVEN  --  1.28  --     Estimated Creatinine Clearance: 70.6 mL/min (A) (by C-G formula based on SCr of 1.39 mg/dL (H)).    Allergies  Allergen Reactions  . Lactose Intolerance (Gi) Diarrhea and Other (See Comments)    Bloating (also)  . Other Other (See Comments)    Red meat causes stomach pains, bloating and diarrhea  . Milk-Related Compounds Diarrhea and Other (See Comments)    Any dairy products  - diarrhea and bloating    Vanc 2/22 >> Zosyn 2/22 >> [place] PTA 2/14 >> 2/21  2/21 BCx -    [name] D. Mina Marble, PharmD, BCPS Pager:  [phone] 08/08/2017, 1:10 PM

## 2017-08-08 NOTE — H&P (Signed)
History and Physical    Shane [name] PYP:[id number] DOB: August 14, 1970 DOA: 08/07/2017  PCP: Charlott Rakes, MD  Patient coming from: Home.  Chief Complaint: Right foot swelling and pain.  HPI: Shane [name] is a 47 y.o. male with history of diabetes mellitus type 2, hypertension, chronic kidney disease, anemia, depression presents to the ER for the third time in the last 3 weeks with worsening swelling of the right foot.  Patient states his symptoms started 3 weeks ago with loss of the toenail in the right great toe.  Gradually his foot started swelling with increasing pain and some discoloration of the skin.  He started losing all other toenails also.  Patient.  The swelling.  Increasing pain.  Had come to the ER and was given Keflex for 1 week since he did not improve patient came again and was given clindamycin.  Since swelling is worsening with erythema and some skin desquamation and toenail loss patient comes again this time.  Patient had similar symptoms in December 2018 2 months ago and was admitted for cellulitis.  Patient has previous history of left foot transmetatarsal amputation.  ED Course: In the ER patient is afebrile but foot is swollen and red with loss of toenails.  There is some desquamation of skin on the dorsal aspect.  X-ray did not show any acute changes.  Patient is being admitted for cellulitis.  Patient blood pressure is found to be markedly elevated with systolic more than 245.  Patient states he has been taking his medications as advised.  Including antihypertensive and Lantus.  Patient is given 1 dose of hydralazine for uncontrolled blood pressure and blood cultures were obtained and started on antibiotics.  Review of Systems: As per HPI, rest all negative.   Past Medical History:  Diagnosis Date  . Anemia   . Bipolar disorder ([place])   . Cellulitis 05/2017   RIGHT FOOT   DIABETIC ULCER   . Cellulitis and abscess of left leg 06/2016  . Chest pain    a. 2015  Reportedly normal stress test in [place].  Marland Kitchen Chronic diastolic CHF (congestive heart failure) (HCC)    a.03/2015 Echo: EF 55-60%, Gr 1 DD, mild MR, triv PR.  . Depression   . Depression with anxiety   . Dyspnea    with exertion  . GERD (gastroesophageal reflux disease)   . Glaucoma   . Hyperlipidemia   . Hypertension    a. 08/2014 Admitted with hypertensive urgency.  . Insomnia   . Internal carotid artery stenosis   . Lower GI bleed 07/04/2015  . Neuropathy   . Refusal of blood transfusions as patient is Jehovah's Witness   . TIA (transient ischemic attack) 08/2014; 03/2015   a. 08/2014 in setting of hypertensive urgency.  . Type II diabetes mellitus ([place])    started insulin spring 2016, Type II  . Vitamin D deficiency spring 2016    Past Surgical History:  Procedure Laterality Date  . AMPUTATION Left 08/03/2015   Procedure: AMPUTATION LEFT GREAT TOE;  Surgeon: Newt Minion, MD;  Location: [facility];  Service: Orthopedics;  Laterality: Left;  . AMPUTATION Left 12/02/2015   Procedure: amputation of left 2nd digit  . AMPUTATION Left 04/10/2016   Procedure: Left 2nd Toe Amputation at MTP Joint;  Surgeon: Newt Minion, MD;  Location: [facility];  Service: Orthopedics;  Laterality: Left;  . AMPUTATION Left 06/09/2016   Procedure: AMPUTATION LEFT THIRD TOE;  Surgeon: [name], MD;  Location:  [facility];  Service: Orthopedics;  Laterality: Left;  . AMPUTATION Left 06/26/2016   Procedure: Left Foot Transmetatarsal Amputation;  Surgeon: Newt Minion, MD;  Location: [facility];  Service: Orthopedics;  Laterality: Left;  . APPLICATION OF WOUND VAC Left 12/19/2015   Procedure: APPLICATION OF WOUND VAC;  Surgeon: Meredith Pel, MD;  Location: [facility];  Service: Orthopedics;  Laterality: Left;  . CIRCUMCISION    . COLONOSCOPY N/A 01/22/2016   Procedure: COLONOSCOPY;  Surgeon: Gatha Mayer, MD;  Location: [facility];  Service: Endoscopy;  Laterality: N/A;  . ESOPHAGOGASTRODUODENOSCOPY N/A 01/19/2016   Procedure:  ESOPHAGOGASTRODUODENOSCOPY (EGD);  Surgeon: Manus Gunning, MD;  Location: [facility];  Service: Gastroenterology;  Laterality: N/A;  . ESOPHAGOGASTRODUODENOSCOPY N/A 01/22/2016   Procedure: ESOPHAGOGASTRODUODENOSCOPY (EGD);  Surgeon: Gatha Mayer, MD;  Location[facility] ENDOSCOPY;  Service: Endoscopy;  Laterality: N/A;  . I&D EXTREMITY Left 12/02/2015   Procedure: IRRIGATION AND DEBRIDEMENT OF FOOT; LEFT SECOND TOE AMPUTATION;  Surgeon: Meredith Pel, MD;  Location: [facility];  Service: Orthopedics;  Laterality: Left;  . I&D EXTREMITY Left 12/19/2015   Procedure: I & D LEFT FOOT WITH BEADS ;  Surgeon: Meredith Pel, MD;  Location: [facility] [place];  Service: Orthopedics;  Laterality: Left;  . I&D EXTREMITY Right 01/17/2016   Procedure: IRRIGATION AND DEBRIDEMENT RIGHT FOOT;  Surgeon: Newt Minion, MD;  Location: [facility];  Service: Orthopedics;  Laterality: Right;  . INCISION AND DRAINAGE FOOT Right 01/17/2016  . INGUINAL HERNIA REPAIR Bilateral ~ 1983- ~ 1986  . TONSILLECTOMY  ~ 1985     reports that  has never smoked. he has never used smokeless tobacco. He reports that he does not drink alcohol or use drugs.  Allergies  Allergen Reactions  . Lactose Intolerance (Gi) Diarrhea and Other (See Comments)    Bloating (also)  . Other Other (See Comments)    Red meat causes stomach pains, bloating and diarrhea  . Milk-Related Compounds Diarrhea and Other (See Comments)    Any dairy products  - diarrhea and bloating    Family History  Problem Relation Age of Onset  . Hypertension Mother   . Diabetes Mother   . Hyperlipidemia Mother   . Heart disease Mother        s/p pacemaker  . Diabetes Father   . Hypertension Father   . Stroke Father   . Heart attack Father        first MI @ 85.  . Depression Father   . Dementia Father   . Stroke Brother   . ADD / ADHD Brother   . Anxiety disorder Brother   . Bipolar disorder Brother   . OCD Brother   . Sexual abuse Brother     Prior to  Admission medications   Medication Sig Start Date End Date Taking? Authorizing Provider  amLODipine (NORVASC) 10 MG tablet Take 1 tablet (10 mg total) by mouth daily. 01/20/17  Yes Funches, Adriana Mccallum, MD  aspirin EC 81 MG tablet Take 1 tablet (81 mg total) by mouth daily. 09/16/16  Yes Funches, Josalyn, MD  atorvastatin (LIPITOR) 80 MG tablet Take 1 tablet (80 mg total) by mouth every morning. Patient taking differently: Take 80 mg by mouth daily.  03/03/17  Yes Charlott Rakes, MD  carvedilol (COREG) 25 MG tablet TAKE 1 TABLET BY MOUTH 2 TIMES DAILY WITH A MEAL Patient taking differently: Take 25 mg by mouth 2 (two) times daily with a meal.  07/04/17  Yes  Charlott Rakes, MD  clindamycin (CLEOCIN) 300 MG capsule Take 1 capsule (300 mg total) by mouth 3 (three) times daily. X 7 days Patient taking differently: Take 300 mg by mouth 3 (three) times daily. 7 day course ordered 07/31/17 07/31/17  Yes Drenda Freeze, MD  FLUoxetine (PROZAC) 40 MG capsule Take 1 capsule (40 mg total) by mouth daily. 08/06/17  Yes Eksir, Richard Miu, MD  fluticasone [facility]) 50 MCG/ACT nasal spray Place 2 sprays into both nostrils daily. Patient taking differently: Place 2 sprays into both nostrils daily as needed for allergies or rhinitis.  09/01/16  Yes Julianne Rice, MD  folic acid (FOLVITE) 1 MG tablet Take 1 tablet (1 mg total) by mouth daily. 07/04/16  Yes Asencion Partridge, MD  gabapentin (NEURONTIN) 300 MG capsule Take 1 capsule (300 mg total) by mouth 2 (two) times daily. 05/07/17  Yes Charlott Rakes, MD  glipiZIDE (GLUCOTROL XL) 10 MG 24 hr tablet Take 2 tablets (20 mg total) by mouth daily with breakfast. Patient taking differently: Take 10 mg by mouth daily with breakfast.  03/03/17  Yes Newlin, Enobong, MD  ibuprofen (ADVIL,MOTRIN) 800 MG tablet TAKE 1 TABLET BY MOUTH EVERY 8 HOURS AS NEEDED. Patient taking differently: TAKE 1 TABLET (800 MG) BY MOUTH EVERY 8 HOURS AS NEEDED FOR PAIN 01/10/17  Yes Funches, Josalyn,  MD  Insulin Glargine (LANTUS SOLOSTAR) 100 UNIT/ML Solostar Pen Inject subcutaneously 30 units in the morning and 15 units in the evening Patient taking differently: Inject 15-30 Units into the skin See admin instructions. Inject 30 units subcutaneously before breakfast and 15 units at bedtime 07/04/17  Yes Newlin, Enobong, MD  lisinopril (PRINIVIL,ZESTRIL) 40 MG tablet Take 1 tablet (40 mg total) by mouth daily. 03/03/17  Yes Charlott Rakes, MD  ondansetron (ZOFRAN ODT) 8 MG disintegrating tablet Take 1 tablet (8 mg total) by mouth every 8 (eight) hours as needed for nausea or vomiting. 11/12/16  Yes Funches, Josalyn, MD  pantoprazole (PROTONIX) 40 MG tablet Take 1 tablet (40 mg total) by mouth 2 (two) times daily before a meal. 02/14/17  Yes Lemmon, Lavone Nian, PA  polyethylene glycol (MIRALAX / GLYCOLAX) packet Take 17 g by mouth daily as needed for moderate constipation. Patient taking differently: Take 17 g by mouth daily as needed for moderate constipation. Mix in 8 oz liquid and drink 07/03/16  Yes Asencion Partridge, MD  senna-docusate (SENOKOT-S) 8.6-50 MG tablet Take 1 tablet by mouth at bedtime. 07/03/16  Yes Asencion Partridge, MD  topiramate (TOPAMAX) 50 MG tablet Start 1/2 tablet nightly for 1 week, then increase to the full tablet Patient taking differently: Take 50 mg by mouth at bedtime.  08/06/17  Yes Eksir, Richard Miu, MD  traZODone (DESYREL) 100 MG tablet Take 2 tablets (200 mg total) by mouth at bedtime. 08/06/17  Yes Eksir, Richard Miu, MD  Vitamin D, Ergocalciferol, (DRISDOL) 50000 units CAPS capsule Take 1 capsule (50,000 Units total) by mouth every 30 (thirty) days. Every 30 days Patient taking differently: Take 50,000 Units by mouth See admin instructions. Take one capsule (50000 units) by mouth on the 1st Sunday of each month 04/26/16  Yes Funches, Josalyn, MD  Blood Glucose Monitoring Suppl (TRUE METRIX METER) DEVI 1 each by Does not apply route 3 (three) times daily. 03/17/17    Charlott Rakes, MD  Elastic Bandages & Supports (T.E.D. KNEE LENGTH/S-REGULAR) MISC Wear when awake 09/20/16   Pollina, Gwenyth Allegra, MD  glucose blood (ACCU-CHEK AVIVA PLUS) test strip Use as instructed 02/24/17  Charlott Rakes, MD  glucose blood (TRUE METRIX BLOOD GLUCOSE TEST) test strip 1 each by Other route 3 (three) times daily. 03/17/17   Charlott Rakes, MD  liraglutide (VICTOZA) 18 MG/3ML SOPN Inject 0.3 mLs (1.8 mg total) into the skin daily with breakfast. Start with 0.1 mls for 1 week then 0.2 mls for 1 week then 0.3 mls thereafter Patient taking differently: Inject 0.6 mg into the skin See admin instructions. Order date 07/04/17: Inject  0.1 mls (0.6 mg) subcutaneously daily with breakfast for 1 week, then inject 0.2 mls (1.2 mg) daily with breakfast for 1 week, then inject 0.3 mls (1.8 mg) daily with breakfast thereafter 07/04/17   Charlott Rakes, MD  sucralfate (CARAFATE) 1 g tablet Take 1 tablet (1 g total) by mouth 4 (four) times daily -  with meals and at bedtime. Patient not taking: Reported on 08/06/2017 02/14/17   Levin Erp, [place]    Physical Exam: Vitals:   08/07/17 2016 08/07/17 2152 08/07/17 2230 08/07/17 2303  BP: (!) 199/111 (!) 201/101 (!) 155/95 (!) 152/89  Pulse: 74  81 87  Resp: 18  18 16   Temp:    97.7 F (36.5 C)  TempSrc:    Oral  SpO2: 100%  100% 98%  Weight:    92.8 kg (204 lb 9.4 oz)  Height:    5\' 7"  (1.702 m)      Constitutional: Moderately built and nourished. Vitals:   08/07/17 2016 08/07/17 2152 08/07/17 2230 08/07/17 2303  BP: (!) 199/111 (!) 201/101 (!) 155/95 (!) 152/89  Pulse: 74  81 87  Resp: 18  18 16   Temp:    97.7 F (36.5 C)  TempSrc:    Oral  SpO2: 100%  100% 98%  Weight:    92.8 kg (204 lb 9.4 oz)  Height:    5\' 7"  (1.702 m)   Eyes: Anicteric no pallor. ENMT: No discharge from the ears eyes nose or mouth. Neck: No mass felt.  No JVD appreciated. Respiratory: No rhonchi or crepitations. Cardiovascular: S1-S2 heard no  murmurs appreciated. Abdomen: Soft nontender bowel sounds present. Musculoskeletal: Left foot transmetatarsal amputation stump looks clean.  Right foot looks swollen with skin desquamation and toenail loss.  Erythema extending from the toes to the ankle on the right foot.  No limitation of movements.  No acute ischemic changes. Skin: Right foot looks erythematous with desquamation of skin.  No acute ischemic changes. Neurologic: Alert awake oriented to time place and person moves all extremities. Psychiatric: Appears normal.   Labs on Admission: I have personally reviewed following labs and imaging studies  CBC: Recent Labs  Lab 08/07/17 1234  WBC 9.3  NEUTROABS 6.6  HGB 12.4*  HCT 38.6*  MCV 92.8  PLT 650   Basic Metabolic Panel: Recent Labs  Lab 08/07/17 1234  NA 140  K 4.3  CL 105  CO2 27  GLUCOSE 159*  BUN 28*  CREATININE 1.58*  CALCIUM 9.2   GFR: Estimated Creatinine Clearance: 62.1 mL/min (A) (by C-G formula based on SCr of 1.58 mg/dL (H)). Liver Function Tests: Recent Labs  Lab 08/07/17 1234  AST 18  ALT 18  ALKPHOS 74  BILITOT 0.3  PROT 7.1  ALBUMIN 3.6   No results for input(s): LIPASE, AMYLASE in the last 168 hours. No results for input(s): AMMONIA in the last 168 hours. Coagulation Profile: No results for input(s): INR, PROTIME in the last 168 hours. Cardiac Enzymes: No results for input(s): CKTOTAL, CKMB, CKMBINDEX, TROPONINI  in the last 168 hours. BNP (last 3 results) No results for input(s): PROBNP in the last 8760 hours. HbA1C: No results for input(s): HGBA1C in the last 72 hours. CBG: Recent Labs  Lab 08/07/17 2306  GLUCAP 126*   Lipid Profile: No results for input(s): CHOL, HDL, LDLCALC, TRIG, CHOLHDL, LDLDIRECT in the last 72 hours. Thyroid Function Tests: No results for input(s): TSH, T4TOTAL, FREET4, T3FREE, THYROIDAB in the last 72 hours. Anemia Panel: No results for input(s): VITAMINB12, FOLATE, FERRITIN, TIBC, IRON, RETICCTPCT  in the last 72 hours. Urine analysis:    Component Value Date/Time   COLORURINE YELLOW 07/31/2017 2036   APPEARANCEUR HAZY (A) 07/31/2017 2036   LABSPEC 1.027 07/31/2017 2036   PHURINE 5.0 07/31/2017 2036   GLUCOSEU 50 (A) 07/31/2017 2036   HGBUR SMALL (A) 07/31/2017 2036   BILIRUBINUR NEGATIVE 07/31/2017 2036   BILIRUBINUR negative 03/03/2017 1627   KETONESUR NEGATIVE 07/31/2017 2036   PROTEINUR 100 (A) 07/31/2017 2036   UROBILINOGEN 0.2 03/03/2017 1627   UROBILINOGEN 0.2 04/24/2015 1056   NITRITE NEGATIVE 07/31/2017 2036   LEUKOCYTESUR NEGATIVE 07/31/2017 2036   Sepsis Labs: @LABRCNTIP (procalcitonin:4,lacticidven:4) ) Recent Results (from the past 240 hour(s))  Blood culture (routine x 2)     Status: None   Collection Time: 07/31/17  7:40 PM  Result Value Ref Range Status   Specimen Description BLOOD LEFT ARM  Final   Special Requests   Final    BOTTLES DRAWN AEROBIC AND ANAEROBIC Blood Culture adequate volume   Culture   Final    NO GROWTH 5 DAYS Performed at [facility], [place] [address] [place], [place] [address]    Report Status 08/05/2017 FINAL  Final  Urine culture     Status: None   Collection Time: 07/31/17  7:58 PM  Result Value Ref Range Status   Specimen Description URINE, RANDOM  Final   Special Requests NONE  Final   Culture   Final    NO GROWTH Performed at [facility], [place] [address] [place], [place] [address]    Report Status 08/02/2017 FINAL  Final  Blood culture (routine x 2)     Status: None   Collection Time: 07/31/17  8:12 PM  Result Value Ref Range Status   Specimen Description BLOOD RIGHT ANTECUBITAL  Final   Special Requests   Final    BOTTLES DRAWN AEROBIC AND ANAEROBIC Blood Culture adequate volume   Culture   Final    NO GROWTH 5 DAYS Performed at [facility], [place] [address], [place], [place] [address]    Report Status 08/05/2017 FINAL  Final     Radiological Exams on Admission: Dg Foot Complete  Right  Result Date: 08/07/2017 CLINICAL DATA:  Right foot pain for 1 month. EXAM: RIGHT FOOT COMPLETE - 3+ VIEW COMPARISON:  07/31/2017. FINDINGS: There is no evidence of fracture or dislocation. There is no evidence of arthropathy or other focal bone abnormality. Soft tissues are unremarkable. IMPRESSION: Negative. Electronically Signed   By: Misty Stanley M.D.   On: 08/07/2017 18:40     Assessment/Plan Principal Problem:   Cellulitis of right foot Active Problems:   Diabetes mellitus type 2, uncontrolled, with complications (HCC)   Chronic diastolic CHF (congestive heart failure), NYHA class 1 (HCC)   S/P transmetatarsal amputation of foot, left (HCC)   CKD (chronic kidney disease) stage 3, GFR 30-59 ml/min (HCC)   Hypertension, uncontrolled    1. Cellulitis of the right foot -blood  cultures have been obtained and patient is placed on empiric antibiotics.  Will check sed rate and MRI of the right foot.  Based on which we will have further plans. 2. Hypertensive urgency -patient states he has been compliant with his medications including amlodipine Coreg and lisinopril.  I have ordered 1 dose of hydralazine 10 mg IV and have placed patient on PRN IV hydralazine every 4 hourly.  Closely follow blood pressure trends. 3. Diabetes mellitus type 2 on Lantus insulin which will be continued.  We will hold Glucotrol and Victoza while inpatient.  I have placed patient on sliding scale coverage closely follow CBC trends. 4. Chronic kidney disease stage II creatinine appears to be at baseline. 5. Normocytic normochromic anemia appears to be at baseline when compared to hemoglobin in December.  Probably related to renal disease.  Follow CBC. 6. History of depression/bipolar continue trazodone, Paxil.   DVT prophylaxis: SCDs in anticipation of procedure. Code Status: Full code. Family Communication: Discussed with patient. Disposition Plan: Home. Consults called: None. Admission status:  Inpatient.   Rise Patience MD [facility] Pager [phone].  If 7PM-7AM, please contact night-coverage [email or web address] Password [facility]  08/08/2017, 12:25 AM

## 2017-08-08 NOTE — Progress Notes (Signed)
PROGRESS NOTE   Shane [name]  DGL:[id number]    DOB: August 01, 1970    DOA: 08/07/2017  PCP: Charlott Rakes, MD   I have briefly reviewed patients previous medical records in [facility].  Brief Narrative:  47 year old male with PMH of type II DM with peripheral neuropathy, HTN, HLD, GERD, anxiety and depression, chronic diastolic CHF, chronic kidney disease, left transmetatarsal amputation, presented to ED for the third time in the last 3 weeks due to swelling and wound of his right foot.  Reports 3 weeks history of diminished sensation in right forefoot/toes, loss of all right toenails, progressive swelling, pain, an episode of purulent drainage from right dorsal foot wound.  Treated by ED with a Keflex followed by Clindamycin but kept worsening so came back to ED. Admitted for cellulitis complicating right dorsal foot wound and possible PAD.  Vascular surgery consulted.   Assessment & Plan:   Principal Problem:   Cellulitis of right foot Active Problems:   Diabetes mellitus type 2, uncontrolled, with complications (HCC)   Chronic diastolic CHF (congestive heart failure), NYHA class 1 (HCC)   S/P transmetatarsal amputation of foot, left (HCC)   CKD (chronic kidney disease) stage 3, GFR 30-59 ml/min (HCC)   Hypertension, uncontrolled   1. Cellulitis complicating right dorsal foot wound and possible PAD: MRI of right foot shows cellulitis, reactive bone marrow edema versus osteomyelitis of first toe.  Vascular surgery input appreciated.  Continue empirically started IV vancomycin and Zosyn.  Checking ABI. 2. Uncontrolled type II DM with peripheral neuropathy: A1c 10.1 on 07/04/17.  Needs better diabetes control.  Continue Lantus 30 units daily and NovoLog SSI.  Adjust insulins as needed.  Holding oral medications. 3. Hypertensive urgency: Patient reported compliance with medications.  Improved.  Continue amlodipine, carvedilol and lisinopril.  PRN IV hydralazine. 4. Stage II chronic  kidney disease: Creatinine stable.  Follow BMP periodically. 5. Normocytic anemia:?  Chronic disease.  Follow CBC daily.  No bleeding reported. 6. Anxiety/depression/bipolar disorder: Stable.  Continue trazodone and Paxil.     DVT prophylaxis: SCDs Code Status: Full Family Communication: None at bedside Disposition: DC home pending clinical improvement   Consultants:  Vascular surgery  Procedures:  None  Antimicrobials:  IV vancomycin and Zosyn   Subjective: Feels somewhat better.  Improved pain and swelling in right foot.  ROS: As above  Objective:  Vitals:   08/07/17 2230 08/07/17 2303 08/08/17 0452 08/08/17 1300  BP: (!) 155/95 (!) 152/89 126/76 115/68  Pulse: 81 87 82 77  Resp: 18 16 16 18   Temp:  97.7 F (36.5 C) 97.8 F (36.6 C) 98 F (36.7 C)  TempSrc:  Oral Oral Oral  SpO2: 100% 98% 94% 96%  Weight:  92.8 kg (204 lb 9.4 oz)    Height:  5\' 7"  (1.702 m)      Examination:  General exam: Pleasant young male sitting up comfortably in bed. Respiratory system: Clear to auscultation. Respiratory effort normal. Cardiovascular system: S1 & S2 heard, RRR. No JVD, murmurs, rubs, gallops or clicks. No pedal edema. Gastrointestinal system: Abdomen is nondistended, soft and nontender. No organomegaly or masses felt. Normal bowel sounds heard. Central nervous system: Alert and oriented. No focal neurological deficits. Extremities: Symmetric 5 x 5 power.  Diminished dorsalis pedis and posterior tibial pulse in the right foot.  Superficial wound over dorsum of midfoot without drainage.  Increased temperature in the right foot and faint redness.  Avulsed toenails from all toes.  Left transmetatarsal amputation, healed.  Please see picture below. Skin: No rashes, lesions or ulcers Psychiatry: Judgement and insight appear normal. Mood & affect appropriate.       Data Reviewed: I have personally reviewed following labs and imaging studies  CBC: Recent Labs  Lab  08/07/17 1234 08/08/17 0704  WBC 9.3 7.9  NEUTROABS 6.6  --   HGB 12.4* 10.6*  HCT 38.6* 33.4*  MCV 92.8 93.0  PLT 222 188   Basic Metabolic Panel: Recent Labs  Lab 08/07/17 1234 08/08/17 0704  NA 140 140  K 4.3 3.9  CL 105 106  CO2 27 25  GLUCOSE 159* 163*  BUN 28* 24*  CREATININE 1.58* 1.39*  CALCIUM 9.2 8.6*   Liver Function Tests: Recent Labs  Lab 08/07/17 1234  AST 18  ALT 18  ALKPHOS 74  BILITOT 0.3  PROT 7.1  ALBUMIN 3.6   CBG: Recent Labs  Lab 08/07/17 2306 08/08/17 0823 08/08/17 1228  GLUCAP 126* 143* 230*    Recent Results (from the past 240 hour(s))  Blood culture (routine x 2)     Status: None   Collection Time: 07/31/17  7:40 PM  Result Value Ref Range Status   Specimen Description BLOOD LEFT ARM  Final   Special Requests   Final    BOTTLES DRAWN AEROBIC AND ANAEROBIC Blood Culture adequate volume   Culture   Final    NO GROWTH 5 DAYS Performed at [facility], [place] [address] [place], [place] [address]    Report Status 08/05/2017 FINAL  Final  Urine culture     Status: None   Collection Time: 07/31/17  7:58 PM  Result Value Ref Range Status   Specimen Description URINE, RANDOM  Final   Special Requests NONE  Final   Culture   Final    NO GROWTH Performed at [facility], [place] [address] [place], [place] [address]    Report Status 08/02/2017 FINAL  Final  Blood culture (routine x 2)     Status: None   Collection Time: 07/31/17  8:12 PM  Result Value Ref Range Status   Specimen Description BLOOD RIGHT ANTECUBITAL  Final   Special Requests   Final    BOTTLES DRAWN AEROBIC AND ANAEROBIC Blood Culture adequate volume   Culture   Final    NO GROWTH 5 DAYS Performed at [facility], [place] [address] [place], [place] [address]    Report Status 08/05/2017 FINAL  Final         Radiology Studies: Mr Foot Right Wo Contrast  Result Date: 08/08/2017 CLINICAL DATA:  Open wound on the top of the foot. Right foot  swelling and pain. EXAM: MRI OF THE RIGHT FOREFOOT WITHOUT CONTRAST TECHNIQUE: Multiplanar, multisequence MR imaging of the right foot was performed. No intravenous contrast was administered. COMPARISON:  None. FINDINGS: Bones/Joint/Cartilage Bone marrow edema throughout the first distal phalanx without definite cortical destruction concerning for reactive marrow edema secondary to loss of the big toenail versus osteomyelitis. No bone destruction or periosteal reaction. No fracture or dislocation. Normal alignment. No joint effusion. Ligaments Collateral ligaments are intact.  Lisfranc ligament is intact. Muscles and Tendons Flexor, peroneal and extensor compartment tendons are intact. Muscles are normal. Soft tissue Severe soft tissue edema along the dorsal aspect of the midfoot concerning for cellulitis. No focal drainable fluid collection. No hematoma. No soft tissue mass. IMPRESSION: 1. Bone marrow edema throughout the first distal phalanx without definite cortical destruction concerning for reactive marrow  edema secondary to loss of the big toenail versus osteomyelitis. 2. Severe soft tissue edema along the dorsal aspect of the midfoot concerning for cellulitis. Electronically Signed   By: Kathreen Devoid   On: 08/08/2017 10:53   Dg Foot Complete Right  Result Date: 08/07/2017 CLINICAL DATA:  Right foot pain for 1 month. EXAM: RIGHT FOOT COMPLETE - 3+ VIEW COMPARISON:  07/31/2017. FINDINGS: There is no evidence of fracture or dislocation. There is no evidence of arthropathy or other focal bone abnormality. Soft tissues are unremarkable. IMPRESSION: Negative. Electronically Signed   By: Misty Stanley M.D.   On: 08/07/2017 18:40        Scheduled Meds: . amLODipine  10 mg Oral Daily  . atorvastatin  80 mg Oral Daily  . carvedilol  25 mg Oral BID WC  . FLUoxetine  40 mg Oral Daily  . folic acid  1 mg Oral Daily  . gabapentin  300 mg Oral BID  . insulin aspart  0-9 Units Subcutaneous TID WC  .  insulin glargine  15 Units Subcutaneous QHS  . insulin glargine  30 Units Subcutaneous Daily  . lisinopril  40 mg Oral Daily  . pantoprazole  40 mg Oral BID AC  . senna-docusate  1 tablet Oral QHS  . topiramate  50 mg Oral QHS  . traZODone  200 mg Oral QHS   Continuous Infusions: . piperacillin-tazobactam (ZOSYN)  IV 3.375 g (08/08/17 1121)  . vancomycin       LOS: 1 day     Vernell Leep, MD, FACP, [facility]. [facility] Pager [phone]  If 7PM-7AM, please contact night-coverage [email or web address] Password [email or web address] 08/08/2017, 2:50 PM

## 2017-08-08 NOTE — Consult Note (Signed)
ORTHOPAEDIC CONSULTATION  REQUESTING PHYSICIAN: Modena Jansky, MD  Chief Complaint: New ulcer dorsal of the right foot.  HPI: Shane [name] is a 47 y.o. male who presents with new ulcer dorsum of the right foot.  Patient states he also has just had all of his toenails falling off.  Patient is a type II diabetic.  Past Medical History:  Diagnosis Date  . Anemia   . Bipolar disorder ([place])   . Cellulitis 05/2017   RIGHT FOOT   DIABETIC ULCER   . Cellulitis and abscess of left leg 06/2016  . Chest pain    a. 2015 Reportedly normal stress test in [place].  Marland Kitchen Chronic diastolic CHF (congestive heart failure) (HCC)    a.03/2015 Echo: EF 55-60%, Gr 1 DD, mild MR, triv PR.  . Depression   . Depression with anxiety   . Dyspnea    with exertion  . GERD (gastroesophageal reflux disease)   . Glaucoma   . Hyperlipidemia   . Hypertension    a. 08/2014 Admitted with hypertensive urgency.  . Insomnia   . Internal carotid artery stenosis   . Lower GI bleed 07/04/2015  . Neuropathy   . Refusal of blood transfusions as patient is Jehovah's Witness   . TIA (transient ischemic attack) 08/2014; 03/2015   a. 08/2014 in setting of hypertensive urgency.  . Type II diabetes mellitus ([place])    started insulin spring 2016, Type II  . Vitamin D deficiency spring 2016   Past Surgical History:  Procedure Laterality Date  . AMPUTATION Left 08/03/2015   Procedure: AMPUTATION LEFT GREAT TOE;  Surgeon: Newt Minion, MD;  Location: [place];  Service: Orthopedics;  Laterality: Left;  . AMPUTATION Left 12/02/2015   Procedure: amputation of left 2nd digit  . AMPUTATION Left 04/10/2016   Procedure: Left 2nd Toe Amputation at MTP Joint;  Surgeon: Newt Minion, MD;  Location: [place];  Service: Orthopedics;  Laterality: Left;  . AMPUTATION Left 06/09/2016   Procedure: AMPUTATION LEFT THIRD TOE;  Surgeon: [name], MD;  Location: [place];  Service: Orthopedics;  Laterality: Left;  . AMPUTATION Left 06/26/2016   Procedure: Left Foot Transmetatarsal Amputation;  Surgeon: Newt Minion, MD;  Location: [place];  Service: Orthopedics;  Laterality: Left;  . APPLICATION OF WOUND VAC Left 12/19/2015   Procedure: APPLICATION OF WOUND VAC;  Surgeon: Meredith Pel, MD;  Location: [place];  Service: Orthopedics;  Laterality: Left;  . CIRCUMCISION    . COLONOSCOPY N/A 01/22/2016   Procedure: COLONOSCOPY;  Surgeon: Gatha Mayer, MD;  Location: [place];  Service: Endoscopy;  Laterality: N/A;  . ESOPHAGOGASTRODUODENOSCOPY N/A 01/19/2016   Procedure: ESOPHAGOGASTRODUODENOSCOPY (EGD);  Surgeon: Manus Gunning, MD;  Location: [place];  Service: Gastroenterology;  Laterality: N/A;  . ESOPHAGOGASTRODUODENOSCOPY N/A 01/22/2016   Procedure: ESOPHAGOGASTRODUODENOSCOPY (EGD);  Surgeon: Gatha Mayer, MD;  Location: [facility] ENDOSCOPY;  Service: Endoscopy;  Laterality: N/A;  . I&D EXTREMITY Left 12/02/2015   Procedure: IRRIGATION AND DEBRIDEMENT OF FOOT; LEFT SECOND TOE AMPUTATION;  Surgeon: Meredith Pel, MD;  Location: [place];  Service: Orthopedics;  Laterality: Left;  . I&D EXTREMITY Left 12/19/2015   Procedure: I & D LEFT FOOT WITH BEADS ;  Surgeon: Meredith Pel, MD;  Location: [place];  Service: Orthopedics;  Laterality: Left;  . I&D EXTREMITY Right 01/17/2016   Procedure: IRRIGATION AND DEBRIDEMENT RIGHT FOOT;  Surgeon: Newt Minion, MD;  Location: [place];  Service: Orthopedics;  Laterality: Right;  . INCISION AND DRAINAGE FOOT Right 01/17/2016  . INGUINAL HERNIA REPAIR Bilateral ~ 1983- ~ 1986  . TONSILLECTOMY  ~ 98   Social History   Socioeconomic History  . Marital status: Married    Spouse name: None  . Number of children: 1  . Years of education: None  . Highest education level: None  Social Needs  . Financial resource strain: None  . Food insecurity - worry: None  . Food insecurity - inability: None  . Transportation needs - medical: None  . Transportation needs - non-medical: None    Occupational History  . Occupation: disabled  Tobacco Use  . Smoking status: Never Smoker  . Smokeless tobacco: Never Used  Substance and Sexual Activity  . Alcohol use: No    Alcohol/week: 0.0 oz  . Drug use: No  . Sexual activity: No  Other Topics Concern  . None  Social History Narrative   Lives in [place] with wife.  Active but doesn't routinely exercise.   Family History  Problem Relation Age of Onset  . Hypertension Mother   . Diabetes Mother   . Hyperlipidemia Mother   . Heart disease Mother        s/p pacemaker  . Diabetes Father   . Hypertension Father   . Stroke Father   . Heart attack Father        first MI @ 2.  . Depression Father   . Dementia Father   . Stroke Brother   . ADD / ADHD Brother   . Anxiety disorder Brother   . Bipolar disorder Brother   . OCD Brother   . Sexual abuse Brother    - negative except otherwise stated in the family history section Allergies  Allergen Reactions  . Lactose Intolerance (Gi) Diarrhea and Other (See Comments)    Bloating (also)  . Other Other (See Comments)    Red meat causes stomach pains, bloating and diarrhea  . Milk-Related Compounds Diarrhea and Other (See Comments)    Any dairy products  - diarrhea and bloating   Prior to Admission medications   Medication Sig Start Date End Date Taking? Authorizing Provider  amLODipine (NORVASC) 10 MG tablet Take 1 tablet (10 mg total) by mouth daily. 01/20/17  Yes Funches, Adriana Mccallum, MD  aspirin EC 81 MG tablet Take 1 tablet (81 mg total) by mouth daily. 09/16/16  Yes Funches, Josalyn, MD  atorvastatin (LIPITOR) 80 MG tablet Take 1 tablet (80 mg total) by mouth every morning. Patient taking differently: Take 80 mg by mouth daily.  03/03/17  Yes Charlott Rakes, MD  carvedilol (COREG) 25 MG tablet TAKE 1 TABLET BY MOUTH 2 TIMES DAILY WITH A MEAL Patient taking differently: Take 25 mg by mouth 2 (two) times daily with a meal.  07/04/17  Yes Newlin, Enobong, MD  clindamycin (CLEOCIN)  300 MG capsule Take 1 capsule (300 mg total) by mouth 3 (three) times daily. X 7 days Patient taking differently: Take 300 mg by mouth 3 (three) times daily. 7 day course ordered 07/31/17 07/31/17  Yes Drenda Freeze, MD  FLUoxetine (PROZAC) 40 MG capsule Take 1 capsule (40 mg total) by mouth daily. 08/06/17  Yes Eksir, Richard Miu, MD  fluticasone [facility]) 50 MCG/ACT nasal spray Place 2 sprays into both nostrils daily. Patient taking differently: Place 2 sprays into both nostrils daily as needed for allergies or rhinitis.  09/01/16  Yes Julianne Rice, MD  folic acid (FOLVITE) 1 MG tablet Take  1 tablet (1 mg total) by mouth daily. 07/04/16  Yes Asencion Partridge, MD  gabapentin (NEURONTIN) 300 MG capsule Take 1 capsule (300 mg total) by mouth 2 (two) times daily. 05/07/17  Yes Charlott Rakes, MD  glipiZIDE (GLUCOTROL XL) 10 MG 24 hr tablet Take 2 tablets (20 mg total) by mouth daily with breakfast. Patient taking differently: Take 10 mg by mouth daily with breakfast.  03/03/17  Yes Newlin, Enobong, MD  ibuprofen (ADVIL,MOTRIN) 800 MG tablet TAKE 1 TABLET BY MOUTH EVERY 8 HOURS AS NEEDED. Patient taking differently: TAKE 1 TABLET (800 MG) BY MOUTH EVERY 8 HOURS AS NEEDED FOR PAIN 01/10/17  Yes Funches, Josalyn, MD  Insulin Glargine (LANTUS SOLOSTAR) 100 UNIT/ML Solostar Pen Inject subcutaneously 30 units in the morning and 15 units in the evening Patient taking differently: Inject 15-30 Units into the skin See admin instructions. Inject 30 units subcutaneously before breakfast and 15 units at bedtime 07/04/17  Yes Newlin, Enobong, MD  lisinopril (PRINIVIL,ZESTRIL) 40 MG tablet Take 1 tablet (40 mg total) by mouth daily. 03/03/17  Yes Charlott Rakes, MD  ondansetron (ZOFRAN ODT) 8 MG disintegrating tablet Take 1 tablet (8 mg total) by mouth every 8 (eight) hours as needed for nausea or vomiting. 11/12/16  Yes Funches, Josalyn, MD  pantoprazole (PROTONIX) 40 MG tablet Take 1 tablet (40 mg total) by mouth  2 (two) times daily before a meal. 02/14/17  Yes Lemmon, Lavone Nian, PA  polyethylene glycol (MIRALAX / GLYCOLAX) packet Take 17 g by mouth daily as needed for moderate constipation. Patient taking differently: Take 17 g by mouth daily as needed for moderate constipation. Mix in 8 oz liquid and drink 07/03/16  Yes Asencion Partridge, MD  senna-docusate (SENOKOT-S) 8.6-50 MG tablet Take 1 tablet by mouth at bedtime. 07/03/16  Yes Asencion Partridge, MD  topiramate (TOPAMAX) 50 MG tablet Start 1/2 tablet nightly for 1 week, then increase to the full tablet Patient taking differently: Take 50 mg by mouth at bedtime.  08/06/17  Yes Eksir, Richard Miu, MD  traZODone (DESYREL) 100 MG tablet Take 2 tablets (200 mg total) by mouth at bedtime. 08/06/17  Yes Eksir, Richard Miu, MD  Vitamin D, Ergocalciferol, (DRISDOL) 50000 units CAPS capsule Take 1 capsule (50,000 Units total) by mouth every 30 (thirty) days. Every 30 days Patient taking differently: Take 50,000 Units by mouth See admin instructions. Take one capsule (50000 units) by mouth on the 1st Sunday of each month 04/26/16  Yes Funches, Josalyn, MD  Blood Glucose Monitoring Suppl (TRUE METRIX METER) DEVI 1 each by Does not apply route 3 (three) times daily. 03/17/17   Charlott Rakes, MD  Elastic Bandages & Supports (T.E.D. KNEE LENGTH/S-REGULAR) MISC Wear when awake 09/20/16   Pollina, Gwenyth Allegra, MD  glucose blood (ACCU-CHEK AVIVA PLUS) test strip Use as instructed 02/24/17   Charlott Rakes, MD  glucose blood (TRUE METRIX BLOOD GLUCOSE TEST) test strip 1 each by Other route 3 (three) times daily. 03/17/17   Charlott Rakes, MD  liraglutide (VICTOZA) 18 MG/3ML SOPN Inject 0.3 mLs (1.8 mg total) into the skin daily with breakfast. Start with 0.1 mls for 1 week then 0.2 mls for 1 week then 0.3 mls thereafter Patient taking differently: Inject 0.6 mg into the skin See admin instructions. Order date 07/04/17: Inject  0.1 mls (0.6 mg) subcutaneously daily with  breakfast for 1 week, then inject 0.2 mls (1.2 mg) daily with breakfast for 1 week, then inject 0.3 mls (1.8 mg) daily with breakfast thereafter  07/04/17   Charlott Rakes, MD  sucralfate (CARAFATE) 1 g tablet Take 1 tablet (1 g total) by mouth 4 (four) times daily -  with meals and at bedtime. Patient not taking: Reported on 08/06/2017 02/14/17   Levin Erp, PA   Mr Foot Right Wo Contrast  Result Date: 08/08/2017 CLINICAL DATA:  Open wound on the top of the foot. Right foot swelling and pain. EXAM: MRI OF THE RIGHT FOREFOOT WITHOUT CONTRAST TECHNIQUE: Multiplanar, multisequence MR imaging of the right foot was performed. No intravenous contrast was administered. COMPARISON:  None. FINDINGS: Bones/Joint/Cartilage Bone marrow edema throughout the first distal phalanx without definite cortical destruction concerning for reactive marrow edema secondary to loss of the big toenail versus osteomyelitis. No bone destruction or periosteal reaction. No fracture or dislocation. Normal alignment. No joint effusion. Ligaments Collateral ligaments are intact.  Lisfranc ligament is intact. Muscles and Tendons Flexor, peroneal and extensor compartment tendons are intact. Muscles are normal. Soft tissue Severe soft tissue edema along the dorsal aspect of the midfoot concerning for cellulitis. No focal drainable fluid collection. No hematoma. No soft tissue mass. IMPRESSION: 1. Bone marrow edema throughout the first distal phalanx without definite cortical destruction concerning for reactive marrow edema secondary to loss of the big toenail versus osteomyelitis. 2. Severe soft tissue edema along the dorsal aspect of the midfoot concerning for cellulitis. Electronically Signed   By: Kathreen Devoid   On: 08/08/2017 10:53   Dg Foot Complete Right  Result Date: 08/07/2017 CLINICAL DATA:  Right foot pain for 1 month. EXAM: RIGHT FOOT COMPLETE - 3+ VIEW COMPARISON:  07/31/2017. FINDINGS: There is no evidence of fracture  or dislocation. There is no evidence of arthropathy or other focal bone abnormality. Soft tissues are unremarkable. IMPRESSION: Negative. Electronically Signed   By: Misty Stanley M.D.   On: 08/07/2017 18:40   - pertinent xrays, CT, MRI studies were reviewed and independently interpreted  Positive ROS: All other systems have been reviewed and were otherwise negative with the exception of those mentioned in the HPI and as above.  Physical Exam: General: Alert, no acute distress Psychiatric: Patient is competent for consent with normal mood and affect Lymphatic: No axillary or cervical lymphadenopathy Cardiovascular: No pedal edema Respiratory: No cyanosis, no use of accessory musculature GI: No organomegaly, abdomen is soft and non-tender  Skin: Patient has a new ulcer over the dorsum of the foot which is approximately 4 cm in diameter this is partial thickness that appears ischemic.  So patient has just lost all of his toenails he does not have a palpable dorsalis pedis and posterior tibial pulse there is a previous surgical incision from Lisfranc reconstruction.  Patient has good capillary refill  Images:  @ENCIMAGES @   Neurologic: Patient does not have protective sensation bilateral lower extremities.   MUSCULOSKELETAL:  Examination patient is does not have a palpable dorsalis pedis or posterior tibial pulse.  This is the right he has good wrinkling of the skin there is good capillary fill.  Ankle indices are pending. Assessment: Assessment: Diabetic insensate neuropathy with new ulceration of the dorsum of the right foot with loss of toenails without a good pulse.  Plan: Ankle-brachial indices are pending  Plan: If his ABI is decreased patient will be evaluated by vascular surgery for further workup.  Thank you for the consult and the opportunity to see Mr. Benjaman Pott, [place] [phone] 4:30 PM

## 2017-08-08 NOTE — Progress Notes (Signed)
Pharmacy Antibiotic Note  Shane [name] is a 47 y.o. male admitted on 08/07/2017 with cellulitis.  Pharmacy has been consulted for Vancocin and Zosyn dosing.  Plan: Vancomycin 1500mg  x1 then 1000mg  IV every 12 hours.  Goal trough 10-15 mcg/mL. Zosyn 3.375g IV q8h (4 hour infusion).  Height: 5\' 7"  (170.2 cm) Weight: 204 lb 9.4 oz (92.8 kg) IBW/kg (Calculated) : 66.1  Temp (24hrs), Avg:98 F (36.7 C), Min:97.7 F (36.5 C), Max:98.2 F (36.8 C)  Recent Labs  Lab 08/07/17 1234 08/07/17 1319  WBC 9.3  --   CREATININE 1.58*  --   LATICACIDVEN  --  1.28    Estimated Creatinine Clearance: 62.1 mL/min (A) (by C-G formula based on SCr of 1.58 mg/dL (H)).    Allergies  Allergen Reactions  . Lactose Intolerance (Gi) Diarrhea and Other (See Comments)    Bloating (also)  . Other Other (See Comments)    Red meat causes stomach pains, bloating and diarrhea  . Milk-Related Compounds Diarrhea and Other (See Comments)    Any dairy products  - diarrhea and bloating    Thank you for allowing pharmacy to be a part of this patient's care.  Wynona Neat, PharmD, BCPS  08/08/2017 12:38 AM

## 2017-08-09 ENCOUNTER — Inpatient Hospital Stay (HOSPITAL_COMMUNITY): Payer: Medicaid Other

## 2017-08-09 DIAGNOSIS — E118 Type 2 diabetes mellitus with unspecified complications: Secondary | ICD-10-CM

## 2017-08-09 DIAGNOSIS — E1165 Type 2 diabetes mellitus with hyperglycemia: Secondary | ICD-10-CM

## 2017-08-09 DIAGNOSIS — N179 Acute kidney failure, unspecified: Secondary | ICD-10-CM

## 2017-08-09 DIAGNOSIS — L03115 Cellulitis of right lower limb: Secondary | ICD-10-CM

## 2017-08-09 DIAGNOSIS — N189 Chronic kidney disease, unspecified: Secondary | ICD-10-CM

## 2017-08-09 DIAGNOSIS — I10 Essential (primary) hypertension: Secondary | ICD-10-CM

## 2017-08-09 LAB — VANCOMYCIN, RANDOM: Vancomycin Rm: 34

## 2017-08-09 LAB — GLUCOSE, CAPILLARY
GLUCOSE-CAPILLARY: 117 mg/dL — AB (ref 65–99)
GLUCOSE-CAPILLARY: 192 mg/dL — AB (ref 65–99)
Glucose-Capillary: 167 mg/dL — ABNORMAL HIGH (ref 65–99)
Glucose-Capillary: 158 mg/dL — ABNORMAL HIGH (ref 65–99)

## 2017-08-09 LAB — BASIC METABOLIC PANEL
Anion gap: 8 (ref 5–15)
BUN: 30 mg/dL — ABNORMAL HIGH (ref 6–20)
CHLORIDE: 107 mmol/L (ref 101–111)
CO2: 26 mmol/L (ref 22–32)
Calcium: 8.7 mg/dL — ABNORMAL LOW (ref 8.9–10.3)
Creatinine, Ser: 2.08 mg/dL — ABNORMAL HIGH (ref 0.61–1.24)
GFR calc non Af Amer: 36 mL/min — ABNORMAL LOW (ref >60)
GFR, EST AFRICAN AMERICAN: 42 mL/min — AB (ref >60)
Glucose, Bld: 156 mg/dL — ABNORMAL HIGH (ref 65–99)
POTASSIUM: 4.4 mmol/L (ref 3.5–5.1)
Sodium: 141 mmol/L (ref 135–145)

## 2017-08-09 LAB — CBC
HEMATOCRIT: 34.4 % — AB (ref 39.0–52.0)
Hemoglobin: 10.7 g/dL — ABNORMAL LOW (ref 13.0–17.0)
MCH: 29.2 pg (ref 26.0–34.0)
MCHC: 31.1 g/dL (ref 30.0–36.0)
MCV: 93.7 fL (ref 78.0–100.0)
Platelets: 199 10*3/uL (ref 150–400)
RBC: 3.67 MIL/uL — ABNORMAL LOW (ref 4.22–5.81)
RDW: 13.6 % (ref 11.5–15.5)
WBC: 9.6 10*3/uL (ref 4.0–10.5)

## 2017-08-09 MED ORDER — SODIUM CHLORIDE 0.9 % IV SOLN
INTRAVENOUS | Status: DC
  Administered 2017-08-09 – 2017-08-10 (×2): via INTRAVENOUS

## 2017-08-09 NOTE — Progress Notes (Signed)
PROGRESS NOTE   Guadalupe Nickless  XVQ:[id number]    DOB: [birth year]    DOA: 08/07/2017  PCP: Charlott Rakes, MD   I have briefly reviewed patients previous medical records in [facility].  Brief Narrative:  47 year old male with PMH of type II DM with peripheral neuropathy, HTN, HLD, GERD, anxiety and depression, chronic diastolic CHF, chronic kidney disease, left transmetatarsal amputation, presented to ED for the third time in the last 3 weeks due to swelling and wound of his right foot.  Reports 3 weeks history of diminished sensation in right forefoot/toes, loss of all right toenails, progressive swelling, pain, an episode of purulent drainage from right dorsal foot wound.  Treated by ED with a Keflex followed by Clindamycin but kept worsening so came back to ED. Admitted for cellulitis complicating right dorsal foot wound and possible PAD.  Vascular surgery consulted.   Assessment & Plan:   Principal Problem:   Cellulitis of right foot Active Problems:   Diabetes mellitus type 2, uncontrolled, with complications (HCC)   Chronic diastolic CHF (congestive heart failure), NYHA class 1 (HCC)   S/P transmetatarsal amputation of foot, left (HCC)   CKD (chronic kidney disease) stage 3, GFR 30-59 ml/min (HCC)   Hypertension, uncontrolled   1. Cellulitis complicating right dorsal foot wound and possible PAD: MRI of right foot shows cellulitis, reactive bone marrow edema versus osteomyelitis of first toe.  Vascular surgery input appreciated.  Continue empirically started IV Zosyn.  ABI done 2/23 does not suggest significant PAD.  IV vancomycin held due to acute kidney injury.  Somewhat better. 2. Uncontrolled type II DM with peripheral neuropathy: A1c 10.1 on 07/04/17.  Needs better diabetes control.  Continue Lantus 30 units daily +15 units HS as per home regimen and NovoLog SSI.  Adjust insulins as needed.  Holding oral medications.  As per spouse, good control at home in the  110s-120s. 3. Hypertensive urgency: Patient reported compliance with medications.  Improved.  Continue amlodipine, carvedilol.  PRN IV hydralazine.  Discontinued lisinopril due to acute kidney injury. 4. Acute on stage II chronic kidney disease: Baseline creatinine may be in the 1.3-1.5 range.  Presented with creatinine of 1.6 which had improved to 1.39 but again worsened to 2.08 on 2/23.?  Related to vancomycin/ACEI.  Discussed with pharmacy.  Discontinued vancomycin and checking trough this evening.  Discontinued ACEI.  IV fluids.  Follow BMP in a.m. 5. Normocytic anemia:?  Chronic disease.  Stable.  No bleeding reported. 6. Anxiety/depression/bipolar disorder: Stable.  Continue trazodone and Paxil.     DVT prophylaxis: SCDs Code Status: Full Family Communication: Discussed in detail with patient's spouse via speaker phone at bedside. Disposition: DC home pending clinical improvement   Consultants:  Vascular surgery  Procedures:  None  Antimicrobials:  IV vancomycin (held) and Zosyn   Subjective: Overnight events noted.  Attempted to ambulate, felt, hit his leg and some bleeding from the right foot wound.  Bleeding has since stopped.  Mild pain reported in the right foot.  ROS: As above  Objective:  Vitals:   08/08/17 1300 08/08/17 2055 08/09/17 0612 08/09/17 1330  BP: 115/68 105/65 130/75 (!) 105/56  Pulse: 77 74 77 74  Resp: 18 18 18 18   Temp: 98 F (36.7 C) 98.5 F (36.9 C) 97.9 F (36.6 C) 98.1 F (36.7 C)  TempSrc: Oral Oral Oral Oral  SpO2: 96% 98% 99% 98%  Weight:      Height:        Examination:  General exam: Pleasant young male sitting up comfortably in bed. Respiratory system: Clear to auscultation. Respiratory effort normal. Cardiovascular system: S1 & S2 heard, RRR. No JVD, murmurs, rubs, gallops or clicks. No pedal edema. Gastrointestinal system: Abdomen is nondistended, soft and nontender. No organomegaly or masses felt. Normal bowel sounds  heard. Central nervous system: Alert and oriented. No focal neurological deficits. Extremities: Symmetric 5 x 5 power.  Diminished dorsalis pedis and posterior tibial pulse in the right foot.  Superficial wound over dorsum of midfoot without drainage and does not look any worse than yesterday.  Decreasing warmth and faint redness of right foot.  Avulsed toenails from all toes.  Left transmetatarsal amputation, healed.  Please see picture below taken 08/08/17. Skin: No rashes, lesions or ulcers Psychiatry: Judgement and insight appear normal. Mood & affect appropriate.       Data Reviewed: I have personally reviewed following labs and imaging studies  CBC: Recent Labs  Lab 08/07/17 1234 08/08/17 0704 08/09/17 0559  WBC 9.3 7.9 9.6  NEUTROABS 6.6  --   --   HGB 12.4* 10.6* 10.7*  HCT 38.6* 33.4* 34.4*  MCV 92.8 93.0 93.7  PLT 222 194 536   Basic Metabolic Panel: Recent Labs  Lab 08/07/17 1234 08/08/17 0704 08/09/17 0559  NA 140 140 141  K 4.3 3.9 4.4  CL 105 106 107  CO2 27 25 26   GLUCOSE 159* 163* 156*  BUN 28* 24* 30*  CREATININE 1.58* 1.39* 2.08*  CALCIUM 9.2 8.6* 8.7*   Liver Function Tests: Recent Labs  Lab 08/07/17 1234  AST 18  ALT 18  ALKPHOS 74  BILITOT 0.3  PROT 7.1  ALBUMIN 3.6   CBG: Recent Labs  Lab 08/08/17 1408 08/08/17 1717 08/08/17 2054 08/09/17 0835 08/09/17 1225  GLUCAP 122* 201* 190* 167* 158*    Recent Results (from the past 240 hour(s))  Blood culture (routine x 2)     Status: None   Collection Time: 07/31/17  7:40 PM  Result Value Ref Range Status   Specimen Description BLOOD LEFT ARM  Final   Special Requests   Final    BOTTLES DRAWN AEROBIC AND ANAEROBIC Blood Culture adequate volume   Culture   Final    NO GROWTH 5 DAYS Performed at [facility], [place] [address], [place], [place] [address]    Report Status 08/05/2017 FINAL  Final  Urine culture     Status: None   Collection Time: 07/31/17  7:58 PM  Result Value  Ref Range Status   Specimen Description URINE, RANDOM  Final   Special Requests NONE  Final   Culture   Final    NO GROWTH Performed at [facility], [place] [address] [place], [place] [address]    Report Status 08/02/2017 FINAL  Final  Blood culture (routine x 2)     Status: None   Collection Time: 07/31/17  8:12 PM  Result Value Ref Range Status   Specimen Description BLOOD RIGHT ANTECUBITAL  Final   Special Requests   Final    BOTTLES DRAWN AEROBIC AND ANAEROBIC Blood Culture adequate volume   Culture   Final    NO GROWTH 5 DAYS Performed at [facility], [place] [address], [place], [place] [address]    Report Status 08/05/2017 FINAL  Final  Culture, blood (routine x 2)     Status: None (Preliminary result)   Collection Time: 08/07/17  7:45 PM  Result Value Ref Range Status  Specimen Description BLOOD RIGHT ARM  Final   Special Requests   Final    BOTTLES DRAWN AEROBIC AND ANAEROBIC Blood Culture adequate volume   Culture   Final    NO GROWTH 2 DAYS Performed at [facility], [address] [place], [place] [address]    Report Status PENDING  Incomplete  Culture, blood (routine x 2)     Status: None (Preliminary result)   Collection Time: 08/07/17  7:50 PM  Result Value Ref Range Status   Specimen Description BLOOD LEFT ARM  Final   Special Requests   Final    BOTTLES DRAWN AEROBIC AND ANAEROBIC Blood Culture adequate volume   Culture   Final    NO GROWTH 2 DAYS Performed at [facility], [address] [place], [place] [address]    Report Status PENDING  Incomplete         Radiology Studies: Mr Foot Right Wo Contrast  Result Date: 08/08/2017 CLINICAL DATA:  Open wound on the top of the foot. Right foot swelling and pain. EXAM: MRI OF THE RIGHT FOREFOOT WITHOUT CONTRAST TECHNIQUE: Multiplanar, multisequence MR imaging of the right foot was performed. No intravenous contrast was administered. COMPARISON:  None. FINDINGS: Bones/Joint/Cartilage  Bone marrow edema throughout the first distal phalanx without definite cortical destruction concerning for reactive marrow edema secondary to loss of the big toenail versus osteomyelitis. No bone destruction or periosteal reaction. No fracture or dislocation. Normal alignment. No joint effusion. Ligaments Collateral ligaments are intact.  Lisfranc ligament is intact. Muscles and Tendons Flexor, peroneal and extensor compartment tendons are intact. Muscles are normal. Soft tissue Severe soft tissue edema along the dorsal aspect of the midfoot concerning for cellulitis. No focal drainable fluid collection. No hematoma. No soft tissue mass. IMPRESSION: 1. Bone marrow edema throughout the first distal phalanx without definite cortical destruction concerning for reactive marrow edema secondary to loss of the big toenail versus osteomyelitis. 2. Severe soft tissue edema along the dorsal aspect of the midfoot concerning for cellulitis. Electronically Signed   By: Kathreen Devoid   On: 08/08/2017 10:53   Dg Foot Complete Right  Result Date: 08/07/2017 CLINICAL DATA:  Right foot pain for 1 month. EXAM: RIGHT FOOT COMPLETE - 3+ VIEW COMPARISON:  07/31/2017. FINDINGS: There is no evidence of fracture or dislocation. There is no evidence of arthropathy or other focal bone abnormality. Soft tissues are unremarkable. IMPRESSION: Negative. Electronically Signed   By: Misty Stanley M.D.   On: 08/07/2017 18:40        Scheduled Meds: . amLODipine  10 mg Oral Daily  . atorvastatin  80 mg Oral Daily  . carvedilol  25 mg Oral BID WC  . FLUoxetine  40 mg Oral Daily  . folic acid  1 mg Oral Daily  . gabapentin  300 mg Oral BID  . insulin aspart  0-15 Units Subcutaneous TID WC  . insulin aspart  0-5 Units Subcutaneous QHS  . insulin glargine  15 Units Subcutaneous QHS  . insulin glargine  30 Units Subcutaneous Daily  . pantoprazole  40 mg Oral BID AC  . senna-docusate  1 tablet Oral QHS  . topiramate  50 mg Oral QHS   . traZODone  200 mg Oral QHS   Continuous Infusions: . sodium chloride    . piperacillin-tazobactam (ZOSYN)  IV 3.375 g (08/09/17 1257)     LOS: 2 days     Vernell Leep, MD, FACP, [facility]. [facility] Pager [phone]  [phone]  If 7PM-7AM, please contact night-coverage [email or web address] Password [facility] 08/09/2017, 3:23 PM

## 2017-08-09 NOTE — Progress Notes (Signed)
Pharmacy Antibiotic Note  Shane [name] is a 47 y.o. male admitted on 08/07/2017 with cellulitis.  MRI unable to rule out osteomyelitis.  Pharmacy has been consulted for Vancocin and Zosyn dosing.   -SCr up 1.39 >> 2.08; CrCl ~ 50 -vancomycin level= 34 (last dose given at ~ 9am)   Plan: -hold vancomycin -Random level in 24 hrs   Height: 5\' 7"  (170.2 cm) Weight: 204 lb 9.4 oz (92.8 kg) IBW/kg (Calculated) : 66.1  Temp (24hrs), Avg:98.2 F (36.8 C), Min:97.9 F (36.6 C), Max:98.6 F (37 C)  Recent Labs  Lab 08/07/17 1234 08/07/17 1319 08/08/17 0704 08/09/17 0559 08/09/17 2103  WBC 9.3  --  7.9 9.6  --   CREATININE 1.58*  --  1.39* 2.08*  --   LATICACIDVEN  --  1.28  --   --   --   VANCORANDOM  --   --   --   --  34    Estimated Creatinine Clearance: 47.2 mL/min (A) (by C-G formula based on SCr of 2.08 mg/dL (H)).    Allergies  Allergen Reactions  . Lactose Intolerance (Gi) Diarrhea and Other (See Comments)    Bloating (also)  . Other Other (See Comments)    Red meat causes stomach pains, bloating and diarrhea  . Milk-Related Compounds Diarrhea and Other (See Comments)    Any dairy products  - diarrhea and bloating    Vanc 2/22 >> Zosyn 2/22 >> Clinda PTA 2/14 >> 2/21  2/21 BCx - ngtd  Hildred Laser, Pharm D 08/09/2017 10:29 PM

## 2017-08-09 NOTE — Progress Notes (Signed)
Patient returned from Vascular study.

## 2017-08-09 NOTE — Progress Notes (Signed)
ABIs and TBIs completed. Right ABI 1.15 with triphasic Doppler waveforms throughout. TBI of the great toe indicates normal arterial flow. Lt ABI 1.13 with triphasic Doppler waveforms throughout. TBI could not be ascertained due to metatarsal amputation. [facility], [place] 08/09/2017, 11:40 AM

## 2017-08-09 NOTE — Progress Notes (Signed)
Patient taken to have ABI scan done.

## 2017-08-10 LAB — GLUCOSE, CAPILLARY
GLUCOSE-CAPILLARY: 163 mg/dL — AB (ref 65–99)
Glucose-Capillary: 140 mg/dL — ABNORMAL HIGH (ref 65–99)
Glucose-Capillary: 184 mg/dL — ABNORMAL HIGH (ref 65–99)
Glucose-Capillary: 169 mg/dL — ABNORMAL HIGH (ref 65–99)

## 2017-08-10 LAB — BASIC METABOLIC PANEL
Anion gap: 7 (ref 5–15)
BUN: 20 mg/dL (ref 6–20)
CO2: 24 mmol/L (ref 22–32)
Calcium: 8.2 mg/dL — ABNORMAL LOW (ref 8.9–10.3)
Chloride: 110 mmol/L (ref 101–111)
Creatinine, Ser: 1.84 mg/dL — ABNORMAL HIGH (ref 0.61–1.24)
GFR calc Af Amer: 48 mL/min — ABNORMAL LOW (ref >60)
GFR calc non Af Amer: 42 mL/min — ABNORMAL LOW (ref >60)
GLUCOSE: 167 mg/dL — AB (ref 65–99)
POTASSIUM: 4.4 mmol/L (ref 3.5–5.1)
Sodium: 141 mmol/L (ref 135–145)

## 2017-08-10 LAB — VANCOMYCIN, RANDOM: Vancomycin Rm: 17

## 2017-08-10 MED ORDER — VANCOMYCIN HCL 10 G IV SOLR
1250.0000 mg | INTRAVENOUS | Status: DC
  Administered 2017-08-10: 1250 mg via INTRAVENOUS
  Filled 2017-08-10 (×2): qty 1250

## 2017-08-10 MED ORDER — SODIUM CHLORIDE 0.9 % IV SOLN
INTRAVENOUS | Status: AC
  Administered 2017-08-10 – 2017-08-11 (×2): via INTRAVENOUS

## 2017-08-10 NOTE — Progress Notes (Signed)
Pharmacy Antibiotic Note  Shane [name] is a 47 y.o. male admitted on 08/07/2017 with cellulitis.  MRI unable to rule out osteomyelitis.  Pharmacy has been consulted for Vancomycin and Zosyn dosing.   -SCr1.84; CrCl ~ 50 -vancomycin level=  34>>17 over 18 hrs  Plan: -Change vancomycin to 1250mg  IV q24hr -Will follow renal function, cultures and clinical progress    Height: 5\' 7"  (170.2 cm) Weight: 204 lb 9.4 oz (92.8 kg) IBW/kg (Calculated) : 66.1  Temp (24hrs), Avg:98.7 F (37.1 C), Min:98.6 F (37 C), Max:98.8 F (37.1 C)  Recent Labs  Lab 08/07/17 1234 08/07/17 1319 08/08/17 0704 08/09/17 0559 08/09/17 2103 08/10/17 0707 08/10/17 1503  WBC 9.3  --  7.9 9.6  --   --   --   CREATININE 1.58*  --  1.39* 2.08*  --  1.84*  --   LATICACIDVEN  --  1.28  --   --   --   --   --   VANCORANDOM  --   --   --   --  34  --  17    Estimated Creatinine Clearance: 53.3 mL/min (A) (by C-G formula based on SCr of 1.84 mg/dL (H)).    Allergies  Allergen Reactions  . Lactose Intolerance (Gi) Diarrhea and Other (See Comments)    Bloating (also)  . Other Other (See Comments)    Red meat causes stomach pains, bloating and diarrhea  . Milk-Related Compounds Diarrhea and Other (See Comments)    Any dairy products  - diarrhea and bloating    Vanc 2/22 >> Zosyn 2/22 >> Clinda PTA 2/14 >> 2/21  2/21 BCx - ngtd  Hildred Laser, Pharm D 08/10/2017 4:22 PM

## 2017-08-10 NOTE — Progress Notes (Signed)
   ABI's and toe pressures are normal and should have adequate arterial flow to heal current wounds or any amputations he should need in near future. Can f/u with [facility] on prn basis.   Hinton Luellen C. Donzetta Matters, MD [facility] Office: [phone] Pager: [phone]

## 2017-08-10 NOTE — Progress Notes (Signed)
PROGRESS NOTE   Shane [name]  BJY:[id number]    DOB: 07/10/1970    DOA: 08/07/2017  PCP: Charlott Rakes, MD   I have briefly reviewed patients previous medical records in [facility].  Brief Narrative:  47 year old male with PMH of type II DM with peripheral neuropathy, HTN, HLD, GERD, anxiety and depression, chronic diastolic CHF, chronic kidney disease, left transmetatarsal amputation, presented to ED for the third time in the last 3 weeks due to swelling and wound of his right foot.  Reports 3 weeks history of diminished sensation in right forefoot/toes, loss of all right toenails, progressive swelling, pain, an episode of purulent drainage from right dorsal foot wound.  Treated by ED with a Keflex followed by Clindamycin but kept worsening so came back to ED. Admitted for cellulitis complicating right dorsal foot wound and possible PAD.  Vascular surgery consulted.   Assessment & Plan:   Principal Problem:   Cellulitis of right foot Active Problems:   Diabetes mellitus type 2, uncontrolled, with complications (HCC)   Chronic diastolic CHF (congestive heart failure), NYHA class 1 (HCC)   S/P transmetatarsal amputation of foot, left (HCC)   CKD (chronic kidney disease) stage 3, GFR 30-59 ml/min (HCC)   Hypertension, uncontrolled   1. Diabetic foot infection/cellulitis complicating right dorsal foot wound: MRI of right foot shows cellulitis, reactive bone marrow edema versus osteomyelitis of first toe. Continue empirically started IV Zosyn.  ABI done 2/23 does not suggest significant PAD and vascular surgery signed off.  IV vancomycin held due to acute kidney injury.  Improving.  Advised patient regarding diligent skin/foot care and the wound may take several days to a couple of weeks to completely heal.  Consider transitioning to oral antibiotics in a.m. and possible discharge. 2. Uncontrolled type II DM with peripheral neuropathy: A1c 10.1 on 07/04/17.  Needs better diabetes control.   Continue Lantus 30 units daily +15 units HS as per home regimen and NovoLog SSI.  Adjust insulins as needed.  Holding oral medications.  As per spouse, good control at home in the 110s-120s.  Reasonable inpatient control. 3. Hypertensive urgency: Patient reported compliance with medications.  Improved.  Continue amlodipine, carvedilol.  PRN IV hydralazine.  Discontinued lisinopril due to acute kidney injury.  Controlled. 4. Acute on stage II chronic kidney disease: Baseline creatinine may be in the 1.3-1.5 range.  Presented with creatinine of 1.6 which had improved to 1.39 but again worsened to 2.08 on 2/23.?  Related to vancomycin/ACEI.  Discontinued vancomycin and trough 2/23: 34.  Discontinued ACEI.  IV fluids.  Creatinine has improved to 1.8.  Continue IV fluids and follow BMP in a.m. 5. Normocytic anemia:?  Chronic disease.  Stable.  No bleeding reported. 6. Anxiety/depression/bipolar disorder: Stable.  Continue trazodone and Paxil.     DVT prophylaxis: SCDs Code Status: Full Family Communication: None at bedside today. Disposition: DC home pending clinical improvement, possibly 2/25.   Consultants:  Vascular surgery  Procedures:  None  Antimicrobials:  IV vancomycin (held) and Zosyn   Subjective: Right foot pain has improved.  Does not think that the wound has changed.  No drainage or bleeding from right foot wound.  ROS: As above  Objective:  Vitals:   08/09/17 0612 08/09/17 1330 08/09/17 2136 08/10/17 0537  BP: 130/75 (!) 105/56 122/67 128/81  Pulse: 77 74 79 84  Resp: 18 18 18 18   Temp: 97.9 F (36.6 C) 98.1 F (36.7 C) 98.6 F (37 C) 98.8 F (37.1 C)  TempSrc: Oral Oral Oral Oral  SpO2: 99% 98% 97% 90%  Weight:      Height:        Examination:  General exam: Pleasant young male sitting up comfortably in bed. Respiratory system: Clear to auscultation. Respiratory effort normal.  Stable without change Cardiovascular system: S1 & S2 heard, RRR. No JVD,  murmurs, rubs, gallops or clicks. No pedal edema.  Stable without change. Gastrointestinal system: Abdomen is nondistended, soft and nontender. No organomegaly or masses felt. Normal bowel sounds heard. Central nervous system: Alert and oriented. No focal neurological deficits. Extremities: Symmetric 5 x 5 power.  Diminished dorsalis pedis and posterior tibial pulse in the right foot.  Superficial wound over dorsum of midfoot without drainage.  Mild dried blood at the distal end of the wound.  Decreasing warmth and no further redness of right foot.  Avulsed toenails from all toes.  Left transmetatarsal amputation, healed.  Please see picture below taken 08/08/17. Skin: No rashes, lesions or ulcers Psychiatry: Judgement and insight appear normal. Mood & affect appropriate.       Data Reviewed: I have personally reviewed following labs and imaging studies  CBC: Recent Labs  Lab 08/07/17 1234 08/08/17 0704 08/09/17 0559  WBC 9.3 7.9 9.6  NEUTROABS 6.6  --   --   HGB 12.4* 10.6* 10.7*  HCT 38.6* 33.4* 34.4*  MCV 92.8 93.0 93.7  PLT 222 194 371   Basic Metabolic Panel: Recent Labs  Lab 08/07/17 1234 08/08/17 0704 08/09/17 0559 08/10/17 0707  NA 140 140 141 141  K 4.3 3.9 4.4 4.4  CL 105 106 107 110  CO2 27 25 26 24   GLUCOSE 159* 163* 156* 167*  BUN 28* 24* 30* 20  CREATININE 1.58* 1.39* 2.08* 1.84*  CALCIUM 9.2 8.6* 8.7* 8.2*   Liver Function Tests: Recent Labs  Lab 08/07/17 1234  AST 18  ALT 18  ALKPHOS 74  BILITOT 0.3  PROT 7.1  ALBUMIN 3.6   CBG: Recent Labs  Lab 08/09/17 1225 08/09/17 1621 08/09/17 2133 08/10/17 0758 08/10/17 1202  GLUCAP 158* 117* 192* 140* 184*    Recent Results (from the past 240 hour(s))  Blood culture (routine x 2)     Status: None   Collection Time: 07/31/17  7:40 PM  Result Value Ref Range Status   Specimen Description BLOOD LEFT ARM  Final   Special Requests   Final    BOTTLES DRAWN AEROBIC AND ANAEROBIC Blood Culture  adequate volume   Culture   Final    NO GROWTH 5 DAYS Performed at [facility], [place] [address], [place], [place] [address]    Report Status 08/05/2017 FINAL  Final  Urine culture     Status: None   Collection Time: 07/31/17  7:58 PM  Result Value Ref Range Status   Specimen Description URINE, RANDOM  Final   Special Requests NONE  Final   Culture   Final    NO GROWTH Performed at [facility], [place] [address], [place], [place] [address]    Report Status 08/02/2017 FINAL  Final  Blood culture (routine x 2)     Status: None   Collection Time: 07/31/17  8:12 PM  Result Value Ref Range Status   Specimen Description BLOOD RIGHT ANTECUBITAL  Final   Special Requests   Final    BOTTLES DRAWN AEROBIC AND ANAEROBIC Blood Culture adequate volume   Culture   Final    NO GROWTH 5 DAYS  Performed at [facility], [place] [address], [place], [place] [address]    Report Status 08/05/2017 FINAL  Final  Culture, blood (routine x 2)     Status: None (Preliminary result)   Collection Time: 08/07/17  7:45 PM  Result Value Ref Range Status   Specimen Description BLOOD RIGHT ARM  Final   Special Requests   Final    BOTTLES DRAWN AEROBIC AND ANAEROBIC Blood Culture adequate volume   Culture   Final    NO GROWTH 2 DAYS Performed at [facility], [place] [address], [place], [place] [address]    Report Status PENDING  Incomplete  Culture, blood (routine x 2)     Status: None (Preliminary result)   Collection Time: 08/07/17  7:50 PM  Result Value Ref Range Status   Specimen Description BLOOD LEFT ARM  Final   Special Requests   Final    BOTTLES DRAWN AEROBIC AND ANAEROBIC Blood Culture adequate volume   Culture   Final    NO GROWTH 2 DAYS Performed at [facility], [address] [place], [place] [address]    Report Status PENDING  Incomplete         Radiology Studies: No results found.      Scheduled Meds: . amLODipine  10 mg Oral Daily  . atorvastatin   80 mg Oral Daily  . carvedilol  25 mg Oral BID WC  . FLUoxetine  40 mg Oral Daily  . folic acid  1 mg Oral Daily  . gabapentin  300 mg Oral BID  . insulin aspart  0-15 Units Subcutaneous TID WC  . insulin aspart  0-5 Units Subcutaneous QHS  . insulin glargine  15 Units Subcutaneous QHS  . insulin glargine  30 Units Subcutaneous Daily  . pantoprazole  40 mg Oral BID AC  . senna-docusate  1 tablet Oral QHS  . topiramate  50 mg Oral QHS  . traZODone  200 mg Oral QHS   Continuous Infusions: . sodium chloride 75 mL/hr at 08/10/17 1327  . piperacillin-tazobactam (ZOSYN)  IV 3.375 g (08/10/17 1128)     LOS: 3 days     Vernell Leep, MD, FACP, [facility]. [facility] Pager [phone]  If 7PM-7AM, please contact night-coverage [email or web address] Password [facility] 08/10/2017, 2:24 PM

## 2017-08-11 LAB — BASIC METABOLIC PANEL
Anion gap: 5 (ref 5–15)
BUN: 16 mg/dL (ref 6–20)
CALCIUM: 8.4 mg/dL — AB (ref 8.9–10.3)
CO2: 24 mmol/L (ref 22–32)
CREATININE: 1.59 mg/dL — AB (ref 0.61–1.24)
Chloride: 112 mmol/L — ABNORMAL HIGH (ref 101–111)
GFR, EST AFRICAN AMERICAN: 58 mL/min — AB (ref >60)
GFR, EST NON AFRICAN AMERICAN: 50 mL/min — AB (ref >60)
GLUCOSE: 178 mg/dL — AB (ref 65–99)
Potassium: 4.2 mmol/L (ref 3.5–5.1)
Sodium: 141 mmol/L (ref 135–145)

## 2017-08-11 LAB — GLUCOSE, CAPILLARY
GLUCOSE-CAPILLARY: 133 mg/dL — AB (ref 65–99)
GLUCOSE-CAPILLARY: 169 mg/dL — AB (ref 65–99)

## 2017-08-11 MED ORDER — SILVER SULFADIAZINE 1 % EX CREA
TOPICAL_CREAM | Freq: Two times a day (BID) | CUTANEOUS | Status: DC
  Administered 2017-08-11: 13:00:00 via TOPICAL
  Filled 2017-08-11: qty 85

## 2017-08-11 MED ORDER — FLUTICASONE PROPIONATE 50 MCG/ACT NA SUSP
2.0000 | Freq: Every day | NASAL | Status: DC | PRN
Start: 2017-08-11 — End: 2017-11-20

## 2017-08-11 MED ORDER — DOXYCYCLINE HYCLATE 100 MG PO TABS: 100.0000 mg | ORAL_TABLET | Freq: Two times a day (BID) | ORAL | 0 refills | Status: DC

## 2017-08-11 MED ORDER — PENTOXIFYLLINE ER 400 MG PO TBCR: 400.0000 mg | EXTENDED_RELEASE_TABLET | Freq: Three times a day (TID) | ORAL | 0 refills | Status: DC

## 2017-08-11 MED ORDER — TOPIRAMATE 50 MG PO TABS: 50.0000 mg | ORAL_TABLET | Freq: Every day | ORAL | Status: DC

## 2017-08-11 MED ORDER — VITAMIN D (ERGOCALCIFEROL) 1.25 MG (50000 UNIT) PO CAPS: 50000.0000 [IU] | ORAL_CAPSULE | ORAL | Status: DC

## 2017-08-11 MED ORDER — DOXYCYCLINE HYCLATE 100 MG PO TABS
100.0000 mg | ORAL_TABLET | Freq: Two times a day (BID) | ORAL | Status: DC
  Administered 2017-08-11: 100 mg via ORAL
  Filled 2017-08-11: qty 1

## 2017-08-11 MED ORDER — SILVER SULFADIAZINE 1 % EX CREA: TOPICAL_CREAM | Freq: Two times a day (BID) | CUTANEOUS | 0 refills | Status: DC

## 2017-08-11 MED ORDER — GLIPIZIDE ER 10 MG PO TB24: 10.0000 mg | ORAL_TABLET | Freq: Every day | ORAL | Status: DC

## 2017-08-11 MED ORDER — INSULIN GLARGINE 100 UNIT/ML SOLOSTAR PEN: 15.0000 [IU] | PEN_INJECTOR | SUBCUTANEOUS | Status: DC

## 2017-08-11 MED ORDER — ACETAMINOPHEN 325 MG PO TABS: 650.0000 mg | ORAL_TABLET | Freq: Four times a day (QID) | ORAL | Status: DC | PRN

## 2017-08-11 MED FILL — SILVADENE 1% CREAM: 1 | 30 days supply | Qty: 50 | Fill #0

## 2017-08-11 MED FILL — PENTOXIFYLLINE 400 MG TAB S: 400 | 30 days supply | Qty: 90 | Fill #0

## 2017-08-11 MED FILL — ?DOXYCYCLINE 100MG TABLET: 100 | 10 days supply | Qty: 20 | Fill #0

## 2017-08-11 NOTE — Discharge Summary (Signed)
Physician Discharge Summary  Shane [name] QQP:[id number] DOB: 02/24/1971  PCP: Charlott Rakes, MD  Admit date: 08/07/2017 Discharge date: 08/11/2017  Recommendations for Outpatient Follow-up:  1. Dr. Charlott Rakes, PCP at the [facility] on 08/22/17 at 2:30 PM.  To be seen with repeat labs (CBC & BMP). 2. Dr. Meridee Score, Orthopedics on 08/14/17.   Home Health: None Equipment/Devices: None  Discharge Condition: Improved and stable CODE STATUS: Full Diet recommendation: Heart healthy & diabetic diet.  Discharge Diagnoses:  Principal Problem:   Cellulitis of right foot Active Problems:   Diabetes mellitus type 2, uncontrolled, with complications (HCC)   Chronic diastolic CHF (congestive heart failure), NYHA class 1 (HCC)   S/P transmetatarsal amputation of foot, left (HCC)   CKD (chronic kidney disease) stage 3, GFR 30-59 ml/min (HCC)   Hypertension, uncontrolled   Brief Summary: 47 year old male with PMH of type II DM with peripheral neuropathy, HTN, HLD, GERD, anxiety and depression, chronic diastolic CHF, chronic kidney disease, left transmetatarsal amputation, presented to ED for the third time in the last 3 weeks due to swelling and wound of his right foot.  Reports 3 weeks history of diminished sensation in right forefoot/toes, loss of all right toenails, progressive swelling, pain, an episode of purulent drainage from right dorsal foot wound.  Treated by ED with a Keflex followed by Clindamycin but kept worsening so came back to ED. Admitted for cellulitis complicating right dorsal foot wound and possible PAD.  Vascular surgery and orthopedics were consulted.   Assessment & Plan:   1. Diabetic foot infection/cellulitis complicating right dorsal foot wound: MRI of right foot shows cellulitis, reactive bone marrow edema versus osteomyelitis of first toe.  Patient was empirically treated with IV Zosyn and vancomycin.  Vancomycin was  subsequently held due to acute kidney injury.  Vascular surgery and orthopedics were consulted.  ABI were not indicative of significant PAD and vascular surgery signed off.  I discussed with Dr. Sharol Given on day of discharge and no surgical indication, recommends Silvadene dressing to right foot wound and Trental 400 mg TID and outpatient follow-up with him on Thursday, 08/14/17.  Clinically improved.  Advised patient regarding diligent skin/foot care and the wound may take several days to a couple of weeks to completely heal.  Patient fell couple nights back while he was trying to ambulate and reach out with a cane.  Nursing will ambulate him prior to discharge to make sure that he is steady on his feet. 2. Uncontrolled type II DM with peripheral neuropathy: A1c 10.1 on 07/04/17.  Needs better diabetes control. In the hospital treated with Lantus 30 units daily +15 units HS as per home regimen and NovoLog SSI.  CBGs in the hospital were reasonably controlled.  Resume home dose of Lantus and Glucotrol at discharge.  These medications will need to be further adjusted as outpatient as deemed necessary. 3. Hypertensive urgency: Patient reported compliance with medications.  Improved.  Continue amlodipine, carvedilol.    Lisinopril will be temporarily held due to recent acute on chronic kidney disease.  May consider resuming during outpatient follow-up with PCP with repeat BMP, if creatinine stable. 4. Acute on stage II chronic kidney disease: Baseline creatinine may be in the 1.3-1.5 range.  Presented with creatinine of 1.6 which had improved to 1.39 but again worsened to 2.08 on 2/23.?  Related to vancomycin/ACEI.  Discontinued vancomycin and trough 2/23: 34.  Discontinued ACEI.  IV fluids.  Creatinine has improved to 1.5.  Avoid nephrotoxins.  Temporarily held lisinopril and discontinued ibuprofen.  Close outpatient follow-up with repeat BMP.  Creatinine at baseline at this time. 5. Normocytic anemia:?  Chronic  disease.  Stable.  No bleeding reported. 6. Anxiety/depression/bipolar disorder: Stable.  Continue prior home medications.     Consultants:  Vascular surgery Orthopedics  Procedures:  None   Discharge Instructions  Discharge Instructions    Call MD for:  extreme fatigue   Complete by:  As directed    Call MD for:  persistant dizziness or light-headedness   Complete by:  As directed    Call MD for:  redness, tenderness, or signs of infection (pain, swelling, redness, odor or green/yellow discharge around incision site)   Complete by:  As directed    Call MD for:  severe uncontrolled pain   Complete by:  As directed    Call MD for:  temperature >100.4   Complete by:  As directed    Diet - low sodium heart healthy   Complete by:  As directed    Diet Carb Modified   Complete by:  As directed    Increase activity slowly   Complete by:  As directed        Medication List    STOP taking these medications   clindamycin 300 MG capsule Commonly known as:  CLEOCIN   ibuprofen 800 MG tablet Commonly known as:  ADVIL,MOTRIN   lisinopril 40 MG tablet Commonly known as:  PRINIVIL,ZESTRIL   ondansetron 8 MG disintegrating tablet Commonly known as:  ZOFRAN ODT   sucralfate 1 g tablet Commonly known as:  CARAFATE     TAKE these medications   acetaminophen 325 MG tablet Commonly known as:  TYLENOL Take 2 tablets (650 mg total) by mouth every 6 (six) hours as needed for mild pain, moderate pain or fever (or Fever >/= 101).   amLODipine 10 MG tablet Commonly known as:  NORVASC Take 1 tablet (10 mg total) by mouth daily.   aspirin EC 81 MG tablet Take 1 tablet (81 mg total) by mouth daily.   atorvastatin 80 MG tablet Commonly known as:  LIPITOR Take 1 tablet (80 mg total) by mouth every morning. What changed:  when to take this   carvedilol 25 MG tablet Commonly known as:  COREG TAKE 1 TABLET BY MOUTH 2 TIMES DAILY WITH A MEAL What changed:    how much to  take  how to take this  when to take this  additional instructions   doxycycline 100 MG tablet Commonly known as:  VIBRA-TABS Take 1 tablet (100 mg total) by mouth 2 (two) times daily.   FLUoxetine 40 MG capsule Commonly known as:  PROZAC Take 1 capsule (40 mg total) by mouth daily.   fluticasone 50 MCG/ACT nasal spray Commonly known as:  FLONASE Place 2 sprays into both nostrils daily as needed for allergies or rhinitis.   folic acid 1 MG tablet Commonly known as:  FOLVITE Take 1 tablet (1 mg total) by mouth daily.   gabapentin 300 MG capsule Commonly known as:  NEURONTIN Take 1 capsule (300 mg total) by mouth 2 (two) times daily.   glipiZIDE 10 MG 24 hr tablet Commonly known as:  GLUCOTROL XL Take 1 tablet (10 mg total) by mouth daily with breakfast.   glucose blood test strip Commonly known as:  ACCU-CHEK AVIVA PLUS Use as instructed   glucose blood test strip Commonly known as:  TRUE METRIX BLOOD GLUCOSE TEST 1 each by  Other route 3 (three) times daily.   Insulin Glargine 100 UNIT/ML Solostar Pen Commonly known as:  LANTUS SOLOSTAR Inject 15-30 Units into the skin See admin instructions. Inject 30 units subcutaneously before breakfast and 15 units at bedtime What changed:    how much to take  how to take this  when to take this  additional instructions   liraglutide 18 MG/3ML Sopn Commonly known as:  VICTOZA Inject 0.3 mLs (1.8 mg total) into the skin daily with breakfast. Start with 0.1 mls for 1 week then 0.2 mls for 1 week then 0.3 mls thereafter What changed:    how much to take  when to take this  additional instructions   pantoprazole 40 MG tablet Commonly known as:  PROTONIX Take 1 tablet (40 mg total) by mouth 2 (two) times daily before a meal.   pentoxifylline 400 MG CR tablet Commonly known as:  TRENTAL Take 1 tablet (400 mg total) by mouth 3 (three) times daily with meals.   polyethylene glycol packet Commonly known as:  MIRALAX  / GLYCOLAX Take 17 g by mouth daily as needed for moderate constipation. What changed:  additional instructions   senna-docusate 8.6-50 MG tablet Commonly known as:  Senokot-S Take 1 tablet by mouth at bedtime.   silver sulfADIAZINE 1 % cream Commonly known as:  SILVADENE Apply topically 2 (two) times daily. Apply to wound on top of your right foot. Cover with dry dressing.   T.E.D. KNEE LENGTH/S-REGULAR Misc Wear when awake   topiramate 50 MG tablet Commonly known as:  TOPAMAX Take 1 tablet (50 mg total) by mouth at bedtime.   traZODone 100 MG tablet Commonly known as:  DESYREL Take 2 tablets (200 mg total) by mouth at bedtime.   TRUE METRIX METER Devi 1 each by Does not apply route 3 (three) times daily.   Vitamin D (Ergocalciferol) 50000 units Caps capsule Commonly known as:  DRISDOL Take 1 capsule (50,000 Units total) by mouth See admin instructions. Take one capsule (50000 units) by mouth on the 1st Sunday of each month What changed:    when to take this  additional instructions      Follow-up [facility] Follow up.   Why:  August 22, 2017 at 2:30 pm. To be seen with repeat labs (CBC & BMP). Contact information: [place] [address] [phone]       Newt Minion, MD Follow up on 08/14/2017.   Specialty:  Orthopedic Surgery Why:  Call office for timing. Contact information: [place] [address] [phone]          Allergies  Allergen Reactions  . Lactose Intolerance (Gi) Diarrhea and Other (See Comments)    Bloating (also)  . Other Other (See Comments)    Red meat causes stomach pains, bloating and diarrhea  . Milk-Related Compounds Diarrhea and Other (See Comments)    Any dairy products  - diarrhea and bloating      Procedures/Studies:  Mr Foot Right Wo Contrast  Result Date: 08/08/2017 CLINICAL DATA:  Open wound on the top of the  foot. Right foot swelling and pain. EXAM: MRI OF THE RIGHT FOREFOOT WITHOUT CONTRAST TECHNIQUE: Multiplanar, multisequence MR imaging of the right foot was performed. No intravenous contrast was administered. COMPARISON:  None. FINDINGS: Bones/Joint/Cartilage Bone marrow edema throughout the first distal phalanx without definite cortical destruction concerning for reactive marrow edema secondary to loss of  the big toenail versus osteomyelitis. No bone destruction or periosteal reaction. No fracture or dislocation. Normal alignment. No joint effusion. Ligaments Collateral ligaments are intact.  Lisfranc ligament is intact. Muscles and Tendons Flexor, peroneal and extensor compartment tendons are intact. Muscles are normal. Soft tissue Severe soft tissue edema along the dorsal aspect of the midfoot concerning for cellulitis. No focal drainable fluid collection. No hematoma. No soft tissue mass. IMPRESSION: 1. Bone marrow edema throughout the first distal phalanx without definite cortical destruction concerning for reactive marrow edema secondary to loss of the big toenail versus osteomyelitis. 2. Severe soft tissue edema along the dorsal aspect of the midfoot concerning for cellulitis. Electronically Signed   By: Kathreen Devoid   On: 08/08/2017 10:53   Dg Foot Complete Right  Result Date: 08/07/2017 CLINICAL DATA:  Right foot pain for 1 month. EXAM: RIGHT FOOT COMPLETE - 3+ VIEW COMPARISON:  07/31/2017. FINDINGS: There is no evidence of fracture or dislocation. There is no evidence of arthropathy or other focal bone abnormality. Soft tissues are unremarkable. IMPRESSION: Negative. Electronically Signed   By: Misty Stanley M.D.   On: 08/07/2017 18:40     Subjective: "I am fine".  Minimal or no pain in the right foot.  Right foot swelling significantly improved.  No redness or drainage.  Denies any other complaints.  As per RN, no acute issues reported.  Discharge Exam:  Vitals:   08/10/17 1300 08/10/17  2102 08/11/17 0500 08/11/17 1047  BP: 136/78 (!) 145/80 (!) 150/83 (!) 155/86  Pulse: 79 81 80   Resp: 16 16 16    Temp: 98.6 F (37 C) 98.4 F (36.9 C) 97.9 F (36.6 C)   TempSrc: Oral Oral Oral   SpO2: 94% 95% 93%   Weight:      Height:        General exam: Pleasant young male sitting up comfortably in bed. Respiratory system: Clear to auscultation. Respiratory effort normal.   Cardiovascular system: S1 & S2 heard, RRR. No JVD, murmurs, rubs, gallops or clicks. No pedal edema.  Gastrointestinal system: Abdomen is nondistended, soft and nontender. No organomegaly or masses felt. Normal bowel sounds heard. Central nervous system: Alert and oriented. No focal neurological deficits. Extremities: Symmetric 5 x 5 power. Superficial wound over dorsum of midfoot (approximately 2 x 4 cm) without drainage and drying up.  Minimal swelling of the dorsum of the foot which is significantly improved compared to admission.  No erythema or significant increase in warmth.  Avulsed toenails from all toes but no acute findings.  Right posterior tibial palpated but difficult to palpate dorsalis pedis due to wound at that site.  Left transmetatarsal amputation, healed.   Skin: No rashes, lesions or ulcers Psychiatry: Judgement and insight appear normal. Mood & affect appropriate.       The results of significant diagnostics from this hospitalization (including imaging, microbiology, ancillary and laboratory) are listed below for reference.     Microbiology: Recent Results (from the past 240 hour(s))  Culture, blood (routine x 2)     Status: None (Preliminary result)   Collection Time: 08/07/17  7:45 PM  Result Value Ref Range Status   Specimen Description BLOOD RIGHT ARM  Final   Special Requests   Final    BOTTLES DRAWN AEROBIC AND ANAEROBIC Blood Culture adequate volume   Culture   Final    NO GROWTH 3 DAYS Performed at [facility], [address], [place], [place] [address]    Report  Status PENDING  Incomplete  Culture, blood (routine x 2)     Status: None (Preliminary result)   Collection Time: 08/07/17  7:50 PM  Result Value Ref Range Status   Specimen Description BLOOD LEFT ARM  Final   Special Requests   Final    BOTTLES DRAWN AEROBIC AND ANAEROBIC Blood Culture adequate volume   Culture   Final    NO GROWTH 3 DAYS Performed at [facility], [address] [place], [place] [address]    Report Status PENDING  Incomplete     Labs: CBC: Recent Labs  Lab 08/07/17 1234 08/08/17 0704 08/09/17 0559  WBC 9.3 7.9 9.6  NEUTROABS 6.6  --   --   HGB 12.4* 10.6* 10.7*  HCT 38.6* 33.4* 34.4*  MCV 92.8 93.0 93.7  PLT 222 194 650   Basic Metabolic Panel: Recent Labs  Lab 08/07/17 1234 08/08/17 0704 08/09/17 0559 08/10/17 0707 08/11/17 0637  NA 140 140 141 141 141  K 4.3 3.9 4.4 4.4 4.2  CL 105 106 107 110 112*  CO2 27 25 26 24 24   GLUCOSE 159* 163* 156* 167* 178*  BUN 28* 24* 30* 20 16  CREATININE 1.58* 1.39* 2.08* 1.84* 1.59*  CALCIUM 9.2 8.6* 8.7* 8.2* 8.4*   Liver Function Tests: Recent Labs  Lab 08/07/17 1234  AST 18  ALT 18  ALKPHOS 74  BILITOT 0.3  PROT 7.1  ALBUMIN 3.6   CBG: Recent Labs  Lab 08/10/17 0758 08/10/17 1202 08/10/17 1740 08/10/17 2104 08/11/17 0835  GLUCAP 140* 184* 163* 169* 133*      Time coordinating discharge: Over 30 minutes  SIGNED:  Vernell Leep, MD, FACP, [facility]. [facility] Pager [phone]  If 7PM-7AM, please contact night-coverage [email or web address] Password [facility] 08/11/2017, 11:32 AM

## 2017-08-11 NOTE — Progress Notes (Signed)
Discharge instructions reviewed with the Patient this afternoon, Pt had no questions.  Pt understood where he needed to pick up his prescriptions and where/when his f/u appointments. Pt discharged to home with his wife. Pt was taken to front lobby via w/c by staff where he was met by his wife and left via vehicle. Pt had discharge instructions in hand 

## 2017-08-11 NOTE — Discharge Instructions (Signed)
Please get your medications reviewed and adjusted by your Primary MD. ° °Please request your Primary MD to go over all Hospital Tests and Procedure/Radiological results at the follow up, please get all Hospital records sent to your Prim MD by signing hospital release before you go home. ° °If you had Pneumonia of Lung problems at the Hospital: °Please get a 2 view Chest X ray done in 6-8 weeks after hospital discharge or sooner if instructed by your Primary MD. ° °If you have Congestive Heart Failure: °Please call your Cardiologist or Primary MD anytime you have any of the following symptoms:  °1) 3 pound weight gain in 24 hours or 5 pounds in 1 week  °2) shortness of breath, with or without a dry hacking cough  °3) swelling in the hands, feet or stomach  °4) if you have to sleep on extra pillows at night in order to breathe ° °Follow cardiac low salt diet and 1.5 lit/day fluid restriction. ° °If you have diabetes °Accuchecks 4 times/day, Once in AM empty stomach and then before each meal. °Log in all results and show them to your primary doctor at your next visit. °If any glucose reading is under 80 or above 300 call your primary MD immediately. ° °If you have Seizure/Convulsions/Epilepsy: °Please do not drive, operate heavy machinery, participate in activities at heights or participate in high speed sports until you have seen by Primary MD or a Neurologist and advised to do so again. ° °If you had Gastrointestinal Bleeding: °Please ask your Primary MD to check a complete blood count within one week of discharge or at your next visit. Your endoscopic/colonoscopic biopsies that are pending at the time of discharge, will also need to followed by your Primary MD. ° °Get Medicines reviewed and adjusted. °Please take all your medications with you for your next visit with your Primary MD ° °Please request your Primary MD to go over all hospital tests and procedure/radiological results at the follow up, please ask your  Primary MD to get all Hospital records sent to his/her office. ° °If you experience worsening of your admission symptoms, develop shortness of breath, life threatening emergency, suicidal or homicidal thoughts you must seek medical attention immediately by calling 911 or calling your MD immediately  if symptoms less severe. ° °You must read complete instructions/literature along with all the possible adverse reactions/side effects for all the Medicines you take and that have been prescribed to you. Take any new Medicines after you have completely understood and accpet all the possible adverse reactions/side effects.  ° °Do not drive or operate heavy machinery when taking Pain medications.  ° °Do not take more than prescribed Pain, Sleep and Anxiety Medications ° °Special Instructions: If you have smoked or chewed Tobacco  in the last 2 yrs please stop smoking, stop any regular Alcohol  and or any Recreational drug use. ° °Wear Seat belts while driving. ° °Please note °You were cared for by a hospitalist during your hospital stay. If you have any questions about your discharge medications or the care you received while you were in the hospital after you are discharged, you can call the unit and asked to speak with the hospitalist on call if the hospitalist that took care of you is not available. Once you are discharged, your primary care physician will handle any further medical issues. Please note that NO REFILLS for any discharge medications will be authorized once you are discharged, as it is imperative that you   return to your primary care physician (or establish a relationship with a primary care physician if you do not have one) for your aftercare needs so that they can reassess your need for medications and monitor your lab values.  You can reach the hospitalist office at phone [phone] or fax [phone]   If you do not have a primary care physician, you can call [phone] for a physician  referral.   Diabetes and Foot Care Diabetes may cause you to have problems because of poor blood supply (circulation) to your feet and legs. This may cause the skin on your feet to become thinner, break easier, and heal more slowly. Your skin may become dry, and the skin may peel and crack. You may also have nerve damage in your legs and feet causing decreased feeling in them. You may not notice minor injuries to your feet that could lead to infections or more serious problems. Taking care of your feet is one of the most important things you can do for yourself. Follow these instructions at home:  Wear shoes at all times, even in the house. Do not go barefoot. Bare feet are easily injured.  Check your feet daily for blisters, cuts, and redness. If you cannot see the bottom of your feet, use a mirror or ask someone for help.  Wash your feet with warm water (do not use hot water) and mild soap. Then pat your feet and the areas between your toes until they are completely dry. Do not soak your feet as this can dry your skin.  Apply a moisturizing lotion or petroleum jelly (that does not contain alcohol and is unscented) to the skin on your feet and to dry, brittle toenails. Do not apply lotion between your toes.  Trim your toenails straight across. Do not dig under them or around the cuticle. File the edges of your nails with an emery board or nail file.  Do not cut corns or calluses or try to remove them with medicine.  Wear clean socks or stockings every day. Make sure they are not too tight. Do not wear knee-high stockings since they may decrease blood flow to your legs.  Wear shoes that fit properly and have enough cushioning. To break in new shoes, wear them for just a few hours a day. This prevents you from injuring your feet. Always look in your shoes before you put them on to be sure there are no objects inside.  Do not cross your legs. This may decrease the blood flow to your feet.  If  you find a minor scrape, cut, or break in the skin on your feet, keep it and the skin around it clean and dry. These areas may be cleansed with mild soap and water. Do not cleanse the area with peroxide, alcohol, or iodine.  When you remove an adhesive bandage, be sure not to damage the skin around it.  If you have a wound, look at it several times a day to make sure it is healing.  Do not use heating pads or hot water bottles. They may burn your skin. If you have lost feeling in your feet or legs, you may not know it is happening until it is too late.  Make sure your health care provider performs a complete foot exam at least annually or more often if you have foot problems. Report any cuts, sores, or bruises to your health care provider immediately. Contact a health care provider if:  You have  an injury that is not healing.  You have cuts or breaks in the skin.  You have an ingrown nail.  You notice redness on your legs or feet.  You feel burning or tingling in your legs or feet.  You have pain or cramps in your legs and feet.  Your legs or feet are numb.  Your feet always feel cold. Get help right away if:  There is increasing redness, swelling, or pain in or around a wound.  There is a red line that goes up your leg.  Pus is coming from a wound.  You develop a fever or as directed by your health care provider.  You notice a bad smell coming from an ulcer or wound. This information is not intended to replace advice given to you by your health care provider. Make sure you discuss any questions you have with your health care provider. Document Released: 05/31/2000 Document Revised: 11/09/2015 Document Reviewed: 11/10/2012 Elsevier Interactive Patient Education  2017 [employer].

## 2017-08-11 NOTE — Care Management Note (Signed)
Case Management Note  Patient Details  Name: Jasean Ambrosia MRN: [id number] Date of Birth: 1970-10-27  Subjective/Objective:                    Action/Plan:  Patient active with [facility] has appointment August 22, 2017 at 2:30 pm. Can have prescriptions filled at [facility] Expected Discharge Date:                  Expected Discharge Plan:  Home/Self Care  In-House Referral:     Discharge planning Services  CM Consult  Post Acute Care Choice:    Choice offered to:     DME Arranged:  N/A DME Agency:  NA  HH Arranged:  NA HH Agency:     Status of Service:  Completed, signed off  If discussed at [place] of Stay Meetings, dates discussed:    Additional Comments:  Marilu Favre, RN 08/11/2017, 11:01 AM

## 2017-08-11 NOTE — Progress Notes (Signed)
Pt educated on how to care for his wound and apply medication to wound.  He asked appropriate questions and satisfied with answers.  Pt up walking in the hall with this RN, with the aid of the his cane, he denied any dizziness or issues walking. He did not need any PT assessment according to this RN's assessment, and the Pt felt the fall from over the weekend was from him reaching for his cane from his bed, and loosing his balance.  Pt advised that since, he has been getting up OOB to go to the bathroom with out any issues.  Pt will be going home with his wife, who will be home with him and able to assist him with any daily activities and care to his wounds.

## 2017-08-12 LAB — CULTURE, BLOOD (ROUTINE X 2)
Culture: NO GROWTH
Culture: NO GROWTH
SPECIAL REQUESTS: ADEQUATE
Special Requests: ADEQUATE

## 2017-08-14 ENCOUNTER — Ambulatory Visit (INDEPENDENT_AMBULATORY_CARE_PROVIDER_SITE_OTHER): Payer: Self-pay | Admitting: Orthopedic Surgery

## 2017-08-14 ENCOUNTER — Encounter (INDEPENDENT_AMBULATORY_CARE_PROVIDER_SITE_OTHER): Payer: Self-pay | Admitting: Orthopedic Surgery

## 2017-08-14 DIAGNOSIS — E1142 Type 2 diabetes mellitus with diabetic polyneuropathy: Secondary | ICD-10-CM

## 2017-08-14 DIAGNOSIS — L97411 Non-pressure chronic ulcer of right heel and midfoot limited to breakdown of skin: Secondary | ICD-10-CM

## 2017-08-14 MED ORDER — PENTOXIFYLLINE ER 400 MG PO TBCR: 400.0000 mg | EXTENDED_RELEASE_TABLET | Freq: Three times a day (TID) | ORAL | 3 refills | Status: DC

## 2017-08-14 MED ORDER — NITROGLYCERIN 0.2 MG/HR TD PT24: 0.2000 mg | MEDICATED_PATCH | Freq: Every day | TRANSDERMAL | 12 refills | Status: DC

## 2017-08-14 MED FILL — NITROGLYCERIN 0.2 MG/HR PAT: 0.2 | 30 days supply | Qty: 30 | Fill #0

## 2017-08-14 MED FILL — **VICTOZA 18 MG/3 ML INJECT: 18 | 25 days supply | Qty: 3 | Fill #0

## 2017-08-14 NOTE — Progress Notes (Signed)
Office Visit Note   Patient: Shane [name]           Date of Birth: 1970-10-26           MRN: [id number] Visit Date: 08/14/2017              Requested by: Charlott Rakes, MD [place], [place] [address] PCP: Charlott Rakes, MD  Chief Complaint  Patient presents with  . Right Foot - Follow-up      HPI: Patient presents for evaluation for ulcer dorsum right foot.  Patient states his toenails have fallen off he did go to the emergency room he had a vascular surgical workup and was told he had good macro circulation.  Assessment & Plan: Visit Diagnoses:  1. Diabetic polyneuropathy associated with type 2 diabetes mellitus ([place])   2. Midfoot skin ulcer, right, limited to breakdown of skin ([place])     Plan: We will start patient on Trental as well as nitroglycerin patch she will wear the medical compression stocking daily.  Reevaluate in 2 weeks.  This is more of a microcirculatory problem and feel this should resolve well with compression wound care Trental and nitroglycerin.  Follow-Up Instructions: Return in about 2 weeks (around 08/28/2017).   Ortho Exam  Patient is alert, oriented, no adenopathy, well-dressed, normal affect, normal respiratory effort. Examination patient does not have a palpable pulse but by vascular examination he had good microcirculation.  His wound bed has good granulation tissue there is no cellulitis no drainage no odor no signs of infection.  The wound bed appears healthy and viable there is no exposed tendon or bone.  Imaging: No results found.   Labs: Lab Results  Component Value Date   HGBA1C 10.1 07/04/2017   HGBA1C 11.4 05/07/2017   HGBA1C 9.4 01/20/2017   ESRSEDRATE 50 (H) 08/08/2017   ESRSEDRATE 22 (H) 06/24/2016   ESRSEDRATE 27 (H) 06/08/2016   CRP <0.8 06/24/2016   CRP 0.5 02/20/2016   CRP 0.8 12/31/2015   REPTSTATUS 08/12/2017 FINAL 08/07/2017   GRAMSTAIN  02/21/2016    RARE WBC PRESENT, PREDOMINANTLY  MONONUCLEAR RARE GRAM POSITIVE COCCI IN PAIRS    CULT  08/07/2017    NO GROWTH 5 DAYS Performed at [facility], [place] [address], [place], [place] [address]    [place] 02/21/2016    @LABSALLVALUES (HGBA1)@  There is no height or weight on file to calculate BMI.  Orders:  No orders of the defined types were placed in this encounter.  No orders of the defined types were placed in this encounter.    Procedures: No procedures performed  Clinical Data: No additional findings.  ROS:  All other systems negative, except as noted in the HPI. Review of Systems  Objective: Vital Signs: There were no vitals taken for this visit.  Specialty Comments:  No specialty comments available.  PMFS History: Patient Active Problem List   Diagnosis Date Noted  . Midfoot skin ulcer, right, limited to breakdown of skin ([place]) 08/14/2017  . Hypertension, uncontrolled 08/08/2017  . Cellulitis of right foot 08/07/2017  . Bulging lumbar disc 07/04/2017  . Weakness of right lower extremity 05/19/2017  . Cellulitis 05/18/2017  . Achilles tendon contracture, left 01/14/2017  . Gingivitis 11/14/2016  . Achilles tendon contracture, right 09/17/2016  . Pain and swelling of left lower leg 09/16/2016  . CKD (chronic kidney disease) stage 3, GFR 30-59 ml/min (HCC) 08/27/2016  . Dyslipidemia associated with type 2 diabetes mellitus ([place])  07/22/2016  . Constipation 07/02/2016  . S/P transmetatarsal amputation of foot, left ([place]) 07/02/2016  . Dyspnea on exertion 07/02/2016  . Diabetic gastroparesis ([place]) 05/30/2016  . Buzzing in ear, right 03/26/2016  . Joint pain 03/11/2016  . Weakness 03/08/2016  . Dizziness 03/03/2016  . Chronic diastolic CHF (congestive heart failure), NYHA class 1 ([place]) 12/30/2015  . Diabetes mellitus type 2, uncontrolled, with complications ([place]) 76/28/3151  . Depression with anxiety 12/01/2015  . Pain in the chest 12/01/2015  . Insomnia  11/21/2015  . GERD (gastroesophageal reflux disease) 08/18/2015  . Erectile dysfunction 07/04/2015  . Symptomatic anemia 04/24/2015  . Left arm weakness 04/02/2015  . Sensory disturbance 04/02/2015  . Essential hypertension 04/02/2015  . Hemispheric carotid artery syndrome   . HLD (hyperlipidemia)   . Headache    Past Medical History:  Diagnosis Date  . Anemia   . Bipolar disorder ([place])   . Cellulitis 05/2017   RIGHT FOOT   DIABETIC ULCER   . Cellulitis and abscess of left leg 06/2016  . Chest pain    a. 2015 Reportedly normal stress test in [place].  Marland Kitchen Chronic diastolic CHF (congestive heart failure) (HCC)    a.03/2015 Echo: EF 55-60%, Gr 1 DD, mild MR, triv PR.  . Depression   . Depression with anxiety   . Dyspnea    with exertion  . GERD (gastroesophageal reflux disease)   . Glaucoma   . Hyperlipidemia   . Hypertension    a. 08/2014 Admitted with hypertensive urgency.  . Insomnia   . Internal carotid artery stenosis   . Lower GI bleed 07/04/2015  . Neuropathy   . Refusal of blood transfusions as patient is Jehovah's Witness   . TIA (transient ischemic attack) 08/2014; 03/2015   a. 08/2014 in setting of hypertensive urgency.  . Type II diabetes mellitus ([place])    started insulin spring 2016, Type II  . Vitamin D deficiency spring 2016    Family History  Problem Relation Age of Onset  . Hypertension Mother   . Diabetes Mother   . Hyperlipidemia Mother   . Heart disease Mother        s/p pacemaker  . Diabetes Father   . Hypertension Father   . Stroke Father   . Heart attack Father        first MI @ 29.  . Depression Father   . Dementia Father   . Stroke Brother   . ADD / ADHD Brother   . Anxiety disorder Brother   . Bipolar disorder Brother   . OCD Brother   . Sexual abuse Brother     Past Surgical History:  Procedure Laterality Date  . AMPUTATION Left 08/03/2015   Procedure: AMPUTATION LEFT GREAT TOE;  Surgeon: Newt Minion, MD;  Location: [facility];  Service:  Orthopedics;  Laterality: Left;  . AMPUTATION Left 12/02/2015   Procedure: amputation of left 2nd digit  . AMPUTATION Left 04/10/2016   Procedure: Left 2nd Toe Amputation at MTP Joint;  Surgeon: Newt Minion, MD;  Location: [facility];  Service: Orthopedics;  Laterality: Left;  . AMPUTATION Left 06/09/2016   Procedure: AMPUTATION LEFT THIRD TOE;  Surgeon: [name], MD;  Location: [facility];  Service: Orthopedics;  Laterality: Left;  . AMPUTATION Left 06/26/2016   Procedure: Left Foot Transmetatarsal Amputation;  Surgeon: Newt Minion, MD;  Location: [facility];  Service: Orthopedics;  Laterality: Left;  . APPLICATION OF WOUND VAC Left 12/19/2015   Procedure:  APPLICATION OF WOUND VAC;  Surgeon: Meredith Pel, MD;  Location: [facility];  Service: Orthopedics;  Laterality: Left;  . CIRCUMCISION    . COLONOSCOPY N/A 01/22/2016   Procedure: COLONOSCOPY;  Surgeon: Gatha Mayer, MD;  Location: [facility];  Service: Endoscopy;  Laterality: N/A;  . ESOPHAGOGASTRODUODENOSCOPY N/A 01/19/2016   Procedure: ESOPHAGOGASTRODUODENOSCOPY (EGD);  Surgeon: Manus Gunning, MD;  Location: [facility];  Service: Gastroenterology;  Laterality: N/A;  . ESOPHAGOGASTRODUODENOSCOPY N/A 01/22/2016   Procedure: ESOPHAGOGASTRODUODENOSCOPY (EGD);  Surgeon: Gatha Mayer, MD;  Location[facility] ENDOSCOPY;  Service: Endoscopy;  Laterality: N/A;  . I&D EXTREMITY Left 12/02/2015   Procedure: IRRIGATION AND DEBRIDEMENT OF FOOT; LEFT SECOND TOE AMPUTATION;  Surgeon: Meredith Pel, MD;  Location: [facility];  Service: Orthopedics;  Laterality: Left;  . I&D EXTREMITY Left 12/19/2015   Procedure: I & D LEFT FOOT WITH BEADS ;  Surgeon: Meredith Pel, MD;  Location: [facility];  Service: Orthopedics;  Laterality: Left;  . I&D EXTREMITY Right 01/17/2016   Procedure: IRRIGATION AND DEBRIDEMENT RIGHT FOOT;  Surgeon: Newt Minion, MD;  Location: [facility];  Service: Orthopedics;  Laterality: Right;  . INCISION AND DRAINAGE FOOT Right 01/17/2016  .  INGUINAL HERNIA REPAIR Bilateral ~ 1983- ~ 1986  . TONSILLECTOMY  ~ 59   Social History   Occupational History  . Occupation: disabled  Tobacco Use  . Smoking status: Never Smoker  . Smokeless tobacco: Never Used  Substance and Sexual Activity  . Alcohol use: No    Alcohol/week: 0.0 oz  . Drug use: No  . Sexual activity: No

## 2017-08-15 ENCOUNTER — Ambulatory Visit (HOSPITAL_COMMUNITY): Payer: Self-pay

## 2017-08-22 ENCOUNTER — Encounter: Payer: Self-pay | Admitting: Family Medicine

## 2017-08-22 ENCOUNTER — Ambulatory Visit: Payer: Medicaid Other | Attending: Family Medicine | Admitting: Family Medicine

## 2017-08-22 VITALS — BP 164/108 | HR 79 | Temp 97.4°F | Ht 67.0 in | Wt 204.0 lb

## 2017-08-22 DIAGNOSIS — Z89422 Acquired absence of other left toe(s): Secondary | ICD-10-CM | POA: Diagnosis not present

## 2017-08-22 DIAGNOSIS — G4700 Insomnia, unspecified: Secondary | ICD-10-CM | POA: Insufficient documentation

## 2017-08-22 DIAGNOSIS — Z91011 Allergy to milk products: Secondary | ICD-10-CM | POA: Diagnosis not present

## 2017-08-22 DIAGNOSIS — L03115 Cellulitis of right lower limb: Secondary | ICD-10-CM | POA: Insufficient documentation

## 2017-08-22 DIAGNOSIS — E1122 Type 2 diabetes mellitus with diabetic chronic kidney disease: Secondary | ICD-10-CM | POA: Diagnosis not present

## 2017-08-22 DIAGNOSIS — Z9889 Other specified postprocedural states: Secondary | ICD-10-CM | POA: Insufficient documentation

## 2017-08-22 DIAGNOSIS — Z8673 Personal history of transient ischemic attack (TIA), and cerebral infarction without residual deficits: Secondary | ICD-10-CM | POA: Diagnosis not present

## 2017-08-22 DIAGNOSIS — I5032 Chronic diastolic (congestive) heart failure: Secondary | ICD-10-CM | POA: Diagnosis not present

## 2017-08-22 DIAGNOSIS — N179 Acute kidney failure, unspecified: Secondary | ICD-10-CM | POA: Insufficient documentation

## 2017-08-22 DIAGNOSIS — Z91018 Allergy to other foods: Secondary | ICD-10-CM | POA: Diagnosis not present

## 2017-08-22 DIAGNOSIS — F418 Other specified anxiety disorders: Secondary | ICD-10-CM | POA: Insufficient documentation

## 2017-08-22 DIAGNOSIS — Z794 Long term (current) use of insulin: Secondary | ICD-10-CM | POA: Insufficient documentation

## 2017-08-22 DIAGNOSIS — Z89412 Acquired absence of left great toe: Secondary | ICD-10-CM | POA: Diagnosis not present

## 2017-08-22 DIAGNOSIS — E11621 Type 2 diabetes mellitus with foot ulcer: Secondary | ICD-10-CM | POA: Diagnosis not present

## 2017-08-22 DIAGNOSIS — F319 Bipolar disorder, unspecified: Secondary | ICD-10-CM | POA: Insufficient documentation

## 2017-08-22 DIAGNOSIS — N183 Chronic kidney disease, stage 3 (moderate): Secondary | ICD-10-CM

## 2017-08-22 DIAGNOSIS — K219 Gastro-esophageal reflux disease without esophagitis: Secondary | ICD-10-CM | POA: Insufficient documentation

## 2017-08-22 DIAGNOSIS — H409 Unspecified glaucoma: Secondary | ICD-10-CM | POA: Insufficient documentation

## 2017-08-22 DIAGNOSIS — E1165 Type 2 diabetes mellitus with hyperglycemia: Secondary | ICD-10-CM | POA: Insufficient documentation

## 2017-08-22 DIAGNOSIS — E785 Hyperlipidemia, unspecified: Secondary | ICD-10-CM | POA: Insufficient documentation

## 2017-08-22 DIAGNOSIS — E1140 Type 2 diabetes mellitus with diabetic neuropathy, unspecified: Secondary | ICD-10-CM | POA: Diagnosis not present

## 2017-08-22 DIAGNOSIS — I130 Hypertensive heart and chronic kidney disease with heart failure and stage 1 through stage 4 chronic kidney disease, or unspecified chronic kidney disease: Secondary | ICD-10-CM | POA: Insufficient documentation

## 2017-08-22 DIAGNOSIS — Z79899 Other long term (current) drug therapy: Secondary | ICD-10-CM | POA: Insufficient documentation

## 2017-08-22 DIAGNOSIS — E559 Vitamin D deficiency, unspecified: Secondary | ICD-10-CM | POA: Diagnosis not present

## 2017-08-22 DIAGNOSIS — I10 Essential (primary) hypertension: Secondary | ICD-10-CM

## 2017-08-22 DIAGNOSIS — IMO0002 Reserved for concepts with insufficient information to code with codable children: Secondary | ICD-10-CM

## 2017-08-22 DIAGNOSIS — E118 Type 2 diabetes mellitus with unspecified complications: Secondary | ICD-10-CM

## 2017-08-22 DIAGNOSIS — K922 Gastrointestinal hemorrhage, unspecified: Secondary | ICD-10-CM | POA: Diagnosis not present

## 2017-08-22 DIAGNOSIS — Z7982 Long term (current) use of aspirin: Secondary | ICD-10-CM | POA: Insufficient documentation

## 2017-08-22 LAB — GLUCOSE, POCT (MANUAL RESULT ENTRY): POC Glucose: 222 mg/dl — AB (ref 70–99)

## 2017-08-22 MED ORDER — ISOSORBIDE MONONITRATE ER 30 MG PO TB24: 30.0000 mg | ORAL_TABLET | Freq: Every day | ORAL | 3 refills | Status: DC

## 2017-08-22 MED ORDER — INSULIN GLARGINE 100 UNIT/ML SOLOSTAR PEN: 30.0000 [IU] | PEN_INJECTOR | Freq: Two times a day (BID) | SUBCUTANEOUS | 3 refills | Status: DC

## 2017-08-22 MED FILL — TRUE METRIX TEST STRIP: 30 days supply | Qty: 100 | Fill #4

## 2017-08-22 NOTE — Progress Notes (Signed)
Subjective:  Patient ID: Shane [name], male    DOB: 1970/10/11  Age: 47 y.o. MRN: [id number]  CC: Diabetes   HPI [name] is a 47 year old male with a history of type 2 diabetes mellitus (A1c 10.1), s/p L transmetatarsal amputation, hypertension, diabetic neuropathy, depression and anxiety here for a follow up of hospitalization for R foot cellulitis from 08/07/17 - 08/11/17.  He has been referred to the ED after cellulitis did not improve with outpatient management with Clindamycin and Keflex and he had loss of sensation in the right foot. He was seen by vascular surgery and PAD ruled ou by normal ABIs.Also seen by Dr Sharol Given.and commenced on Trental  MRI R foot 08/08/17: IMPRESSION: 1. Bone marrow edema throughout the first distal phalanx without definite cortical destruction concerning for reactive marrow edema secondary to loss of the big toenail versus osteomyelitis. 2. Severe soft tissue edema along the dorsal aspect of the midfoot concerning for cellulitis.  He was treated with IV Vanc and Zozyn but developed acute on chronic kidney injury and Vancomycin wad discontinued. Lisinopril also held as well. His home regimen of medications were continued. His condition improved, creatinine improved from 2.08 to 1.5 and he was subsequently discharged.  Past Medical History:  Diagnosis Date  . Anemia   . Bipolar disorder ([place])   . Cellulitis 05/2017   RIGHT FOOT   DIABETIC ULCER   . Cellulitis and abscess of left leg 06/2016  . Chest pain    a. 2015 Reportedly normal stress test in [place].  Marland Kitchen Chronic diastolic CHF (congestive heart failure) (HCC)    a.03/2015 Echo: EF 55-60%, Gr 1 DD, mild MR, triv PR.  . Depression   . Depression with anxiety   . Dyspnea    with exertion  . GERD (gastroesophageal reflux disease)   . Glaucoma   . Hyperlipidemia   . Hypertension    a. 08/2014 Admitted with hypertensive urgency.  . Insomnia   . Internal carotid artery stenosis   . Lower GI bleed  07/04/2015  . Neuropathy   . Refusal of blood transfusions as patient is Jehovah's Witness   . TIA (transient ischemic attack) 08/2014; 03/2015   a. 08/2014 in setting of hypertensive urgency.  . Type II diabetes mellitus ([name])    started insulin spring 2016, Type II  . Vitamin D deficiency spring 2016    Past Surgical History:  Procedure Laterality Date  . AMPUTATION Left 08/03/2015   Procedure: AMPUTATION LEFT GREAT TOE;  Surgeon: Newt Minion, MD;  Location: [facility];  Service: Orthopedics;  Laterality: Left;  . AMPUTATION Left 12/02/2015   Procedure: amputation of left 2nd digit  . AMPUTATION Left 04/10/2016   Procedure: Left 2nd Toe Amputation at MTP Joint;  Surgeon: Newt Minion, MD;  Location: [facility];  Service: Orthopedics;  Laterality: Left;  . AMPUTATION Left 06/09/2016   Procedure: AMPUTATION LEFT THIRD TOE;  Surgeon: [name], MD;  Location: [facility];  Service: Orthopedics;  Laterality: Left;  . AMPUTATION Left 06/26/2016   Procedure: Left Foot Transmetatarsal Amputation;  Surgeon: Newt Minion, MD;  Location: [facility];  Service: Orthopedics;  Laterality: Left;  . APPLICATION OF WOUND VAC Left 12/19/2015   Procedure: APPLICATION OF WOUND VAC;  Surgeon: Meredith Pel, MD;  Location: [facility];  Service: Orthopedics;  Laterality: Left;  . CIRCUMCISION    . COLONOSCOPY N/A 01/22/2016   Procedure: COLONOSCOPY;  Surgeon: Gatha Mayer, MD;  Location: [facility];  Service: Endoscopy;  Laterality: N/A;  . ESOPHAGOGASTRODUODENOSCOPY N/A 01/19/2016   Procedure: ESOPHAGOGASTRODUODENOSCOPY (EGD);  Surgeon: Manus Gunning, MD;  Location: [facility];  Service: Gastroenterology;  Laterality: N/A;  . ESOPHAGOGASTRODUODENOSCOPY N/A 01/22/2016   Procedure: ESOPHAGOGASTRODUODENOSCOPY (EGD);  Surgeon: Gatha Mayer, MD;  Location: [facility] ENDOSCOPY;  Service: Endoscopy;  Laterality: N/A;  . I&D EXTREMITY Left 12/02/2015   Procedure: IRRIGATION AND DEBRIDEMENT OF FOOT; LEFT SECOND TOE AMPUTATION;   Surgeon: Meredith Pel, MD;  Location: [facility];  Service: Orthopedics;  Laterality: Left;  . I&D EXTREMITY Left 12/19/2015   Procedure: I & D LEFT FOOT WITH BEADS ;  Surgeon: Meredith Pel, MD;  Location: [facility];  Service: Orthopedics;  Laterality: Left;  . I&D EXTREMITY Right 01/17/2016   Procedure: IRRIGATION AND DEBRIDEMENT RIGHT FOOT;  Surgeon: Newt Minion, MD;  Location: [facility];  Service: Orthopedics;  Laterality: Right;  . INCISION AND DRAINAGE FOOT Right 01/17/2016  . INGUINAL HERNIA REPAIR Bilateral ~ 1983- ~ 1986  . TONSILLECTOMY  ~ 1985    Allergies  Allergen Reactions  . Lactose Intolerance (Gi) Diarrhea and Other (See Comments)    Bloating (also)  . Other Other (See Comments)    Red meat causes stomach pains, bloating and diarrhea  . Milk-Related Compounds Diarrhea and Other (See Comments)    Any dairy products  - diarrhea and bloating     Outpatient Medications Prior to Visit  Medication Sig Dispense Refill  . acetaminophen (TYLENOL) 325 MG tablet Take 2 tablets (650 mg total) by mouth every 6 (six) hours as needed for mild pain, moderate pain or fever (or Fever >/= 101).    Marland Kitchen amLODipine (NORVASC) 10 MG tablet Take 1 tablet (10 mg total) by mouth daily. 30 tablet 11  . aspirin EC 81 MG tablet Take 1 tablet (81 mg total) by mouth daily. 30 tablet 11  . atorvastatin (LIPITOR) 80 MG tablet Take 1 tablet (80 mg total) by mouth every morning. (Patient taking differently: Take 80 mg by mouth daily. ) 30 tablet 5  . Blood Glucose Monitoring Suppl (TRUE METRIX METER) DEVI 1 each by Does not apply route 3 (three) times daily. 1 Device 0  . carvedilol (COREG) 25 MG tablet TAKE 1 TABLET BY MOUTH 2 TIMES DAILY WITH A MEAL (Patient taking differently: Take 25 mg by mouth 2 (two) times daily with a meal. ) 60 tablet 5  . doxycycline (VIBRA-TABS) 100 MG tablet Take 1 tablet (100 mg total) by mouth 2 (two) times daily. 20 tablet 0  . Elastic Bandages & Supports (T.E.D. KNEE  LENGTH/S-REGULAR) MISC Wear when awake 1 each 0  . FLUoxetine (PROZAC) 40 MG capsule Take 1 capsule (40 mg total) by mouth daily. 90 capsule 1  . fluticasone (FLONASE) 50 MCG/ACT nasal spray Place 2 sprays into both nostrils daily as needed for allergies or rhinitis.    . folic acid (FOLVITE) 1 MG tablet Take 1 tablet (1 mg total) by mouth daily. 30 tablet 3  . gabapentin (NEURONTIN) 300 MG capsule Take 1 capsule (300 mg total) by mouth 2 (two) times daily. 60 capsule 5  . glipiZIDE (GLUCOTROL XL) 10 MG 24 hr tablet Take 1 tablet (10 mg total) by mouth daily with breakfast.    . glucose blood (ACCU-CHEK AVIVA PLUS) test strip Use as instructed 100 each 12  . glucose blood (TRUE METRIX BLOOD GLUCOSE TEST) test strip 1 each by Other route 3 (three) times daily. 100 each 12  .  nitroGLYCERIN (NITRODUR - DOSED IN MG/24 HR) 0.2 mg/hr patch Place 1 patch (0.2 mg total) onto the skin daily. 30 patch 12  . pantoprazole (PROTONIX) 40 MG tablet Take 1 tablet (40 mg total) by mouth 2 (two) times daily before a meal. 60 tablet 3  . pentoxifylline (TRENTAL) 400 MG CR tablet Take 1 tablet (400 mg total) by mouth 3 (three) times daily with meals. 90 tablet 0  . pentoxifylline (TRENTAL) 400 MG CR tablet Take 1 tablet (400 mg total) by mouth 3 (three) times daily with meals. 90 tablet 3  . polyethylene glycol (MIRALAX / GLYCOLAX) packet Take 17 g by mouth daily as needed for moderate constipation. (Patient taking differently: Take 17 g by mouth daily as needed for moderate constipation. Mix in 8 oz liquid and drink) 14 each 0  . senna-docusate (SENOKOT-S) 8.6-50 MG tablet Take 1 tablet by mouth at bedtime. 60 tablet 2  . silver sulfADIAZINE (SILVADENE) 1 % cream Apply topically 2 (two) times daily. Apply to wound on top of your right foot. Cover with dry dressing. 50 g 0  . topiramate (TOPAMAX) 50 MG tablet Take 1 tablet (50 mg total) by mouth at bedtime.    . traZODone (DESYREL) 100 MG tablet Take 2 tablets (200 mg  total) by mouth at bedtime. 180 tablet 1  . Vitamin D, Ergocalciferol, (DRISDOL) 50000 units CAPS capsule Take 1 capsule (50,000 Units total) by mouth See admin instructions. Take one capsule (50000 units) by mouth on the 1st Sunday of each month    . Insulin Glargine (LANTUS SOLOSTAR) 100 UNIT/ML Solostar Pen Inject 15-30 Units into the skin See admin instructions. Inject 30 units subcutaneously before breakfast and 15 units at bedtime    . liraglutide (VICTOZA) 18 MG/3ML SOPN Inject 0.3 mLs (1.8 mg total) into the skin daily with breakfast. Start with 0.1 mls for 1 week then 0.2 mls for 1 week then 0.3 mls thereafter (Patient not taking: Reported on 08/22/2017) 3 pen 3   No facility-administered medications prior to visit.     ROS Review of Systems  Constitutional: Negative for activity change and appetite change.  HENT: Negative for sinus pressure and sore throat.   Eyes: Negative for visual disturbance.  Respiratory: Negative for cough, chest tightness and shortness of breath.   Cardiovascular: Negative for chest pain and leg swelling.  Gastrointestinal: Negative for abdominal distention, abdominal pain, constipation and diarrhea.  Endocrine: Negative.   Genitourinary: Negative for dysuria.  Musculoskeletal: Negative for joint swelling and myalgias.  Skin: Positive for wound. Negative for rash.  Allergic/Immunologic: Negative.   Neurological: Negative for weakness, light-headedness and numbness.  Psychiatric/Behavioral: Negative for dysphoric mood and suicidal ideas.    Objective:  BP (!) 164/108   Pulse 79   Temp (!) 97.4 F (36.3 C) (Oral)   Ht 5' 7"  (1.702 m)   Wt 204 lb (92.5 kg)   SpO2 100%   BMI 31.95 kg/m   BP/Weight 08/22/2017 08/11/2017 8/91/6945  Systolic BP 038 882 -  Diastolic BP 800 72 -  Wt. (Lbs) 204 - 204.59  BMI 31.95 - 32.04  Some encounter information is confidential and restricted. Go to Review Flowsheets activity to see all data.     Physical Exam    Constitutional: He is oriented to person, place, and time. He appears well-developed and well-nourished.  Cardiovascular: Normal rate, normal heart sounds and intact distal pulses.  No murmur heard. Pulmonary/Chest: Effort normal and breath sounds normal. He has no wheezes.  He has no rales. He exhibits no tenderness.  Abdominal: Soft. Bowel sounds are normal. He exhibits no distension and no mass. There is no tenderness.  Musculoskeletal: Normal range of motion.  Neurological: He is alert and oriented to person, place, and time.  Skin:  Healing vertical ulcer on R dorsum  Psychiatric: He has a normal mood and affect.     Lab Results  Component Value Date   HGBA1C 10.1 07/04/2017     Assessment & Plan:   1. Diabetes mellitus type 2, uncontrolled, with complications ([place]) Uncontrolled with A1c of 10.1 Unable to tolerate Victoza Continue current Lantus dose as blood sugars seem to have improved Counseled on Diabetic diet, my plate method, 076 minutes of moderate intensity exercise/week Keep blood sugar logs with fasting goals of 80-120 mg/dl, random of less than 180 and in the event of sugars less than 60 mg/dl or greater than 400 mg/dl please notify the clinic ASAP. It is recommended that you undergo annual eye exams and annual foot exams. Pneumonia vaccine is recommended. - POCT glucose (manual entry) - CMP14+EGFR - Insulin Glargine (LANTUS SOLOSTAR) 100 UNIT/ML Solostar Pen; Inject 30 Units into the skin 2 (two) times daily.  Dispense: 30 mL; Refill: 3  2. Essential hypertension Uncontrolled He has been taking Lisinopril even though it was held during his hospitalization Will check renal function Counseled on blood pressure goal of less than 130/80, low-sodium, DASH diet, medication compliance, 150 minutes of moderate intensity exercise per week. Discussed medication compliance, adverse effects. Isosorbide added to regimen - isosorbide mononitrate (IMDUR) 30 MG 24 hr tablet;  Take 1 tablet (30 mg total) by mouth daily.  Dispense: 30 tablet; Refill: 3  3. Cellulitis of right foot Improved significantly Follow up with Dr Sharol Given in 2 weeks  4. Acute renal failure superimposed on stage 3 chronic kidney disease, unspecified acute renal failure type ([place]) Medication induced Will check renal function today   Meds ordered this encounter  Medications  . isosorbide mononitrate (IMDUR) 30 MG 24 hr tablet    Sig: Take 1 tablet (30 mg total) by mouth daily.    Dispense:  30 tablet    Refill:  3  . Insulin Glargine (LANTUS SOLOSTAR) 100 UNIT/ML Solostar Pen    Sig: Inject 30 Units into the skin 2 (two) times daily.    Dispense:  30 mL    Refill:  3    Discontinue previous dose    Follow-up: Return in about 3 months (around 11/22/2017) for follow up on diabetes mellitus.   Charlott Rakes MD

## 2017-08-22 NOTE — Progress Notes (Signed)
Patient states that he had an allergic reaction to Victoza.

## 2017-08-22 NOTE — Patient Instructions (Signed)

## 2017-08-23 LAB — CMP14+EGFR
ALBUMIN: 4 g/dL (ref 3.5–5.5)
ALK PHOS: 86 IU/L (ref 39–117)
ALT: 44 IU/L (ref 0–44)
AST: 23 IU/L (ref 0–40)
Albumin/Globulin Ratio: 1.3 (ref 1.2–2.2)
BUN / CREAT RATIO: 15 (ref 9–20)
BUN: 29 mg/dL — ABNORMAL HIGH (ref 6–24)
CHLORIDE: 107 mmol/L — AB (ref 96–106)
CO2: 22 mmol/L (ref 20–29)
Calcium: 8.9 mg/dL (ref 8.7–10.2)
Creatinine, Ser: 1.94 mg/dL — ABNORMAL HIGH (ref 0.76–1.27)
GFR calc Af Amer: 46 mL/min/{1.73_m2} — ABNORMAL LOW (ref >59)
GFR calc non Af Amer: 40 mL/min/{1.73_m2} — ABNORMAL LOW (ref >59)
GLUCOSE: 202 mg/dL — AB (ref 65–99)
Globulin, Total: 3 g/dL (ref 1.5–4.5)
Potassium: 5.1 mmol/L (ref 3.5–5.2)
SODIUM: 141 mmol/L (ref 134–144)
Total Protein: 7 g/dL (ref 6.0–8.5)

## 2017-08-24 ENCOUNTER — Encounter: Payer: Self-pay | Admitting: Family Medicine

## 2017-08-25 ENCOUNTER — Telehealth: Payer: Self-pay

## 2017-08-25 NOTE — Telephone Encounter (Signed)
Patient was called and informed to contact office for lab results. 

## 2017-08-28 ENCOUNTER — Encounter (INDEPENDENT_AMBULATORY_CARE_PROVIDER_SITE_OTHER): Payer: Self-pay | Admitting: Orthopedic Surgery

## 2017-08-28 ENCOUNTER — Ambulatory Visit (INDEPENDENT_AMBULATORY_CARE_PROVIDER_SITE_OTHER): Payer: Self-pay | Admitting: Orthopedic Surgery

## 2017-08-28 ENCOUNTER — Ambulatory Visit: Payer: Self-pay | Attending: Family Medicine

## 2017-08-28 VITALS — Ht 67.0 in | Wt 204.0 lb

## 2017-08-28 DIAGNOSIS — L97411 Non-pressure chronic ulcer of right heel and midfoot limited to breakdown of skin: Secondary | ICD-10-CM

## 2017-08-28 DIAGNOSIS — I10 Essential (primary) hypertension: Secondary | ICD-10-CM | POA: Insufficient documentation

## 2017-08-28 DIAGNOSIS — N183 Chronic kidney disease, stage 3 (moderate): Secondary | ICD-10-CM | POA: Insufficient documentation

## 2017-08-28 DIAGNOSIS — N179 Acute kidney failure, unspecified: Secondary | ICD-10-CM | POA: Insufficient documentation

## 2017-08-28 NOTE — Progress Notes (Signed)
Office Visit Note   Patient: Shane [name]           Date of Birth: 1970-09-17           MRN: [id number] Visit Date: 08/28/2017              Requested by: Charlott Rakes, MD [place], [place] [address] PCP: Charlott Rakes, MD  Chief Complaint  Patient presents with  . Right Foot - Wound Check      HPI: Patient is a 47 year old gentleman diabetic insensate neuropathy who has started wearing the Vive compression sock for ulcer dorsum of the right foot.  He states that he is not using the Trental or the nitroglycerin patch.  Patient states "as soon is he put the sock on it started healing."  Patient denies any swelling redness or odor.  He is very pleased with his progress.  Assessment & Plan: Visit Diagnoses:  1. Midfoot skin ulcer, right, limited to breakdown of skin ([place])     Plan: Continue wearing the medical compression stocking around the clock.  Follow-Up Instructions: Return in about 4 weeks (around 09/25/2017).   Ortho Exam  Patient is alert, oriented, no adenopathy, well-dressed, normal affect, normal respiratory effort. Examination patient has very minimal swelling in the foot and ankle.  The ulcer after debridement has 100% beefy granulation tissue this measures 22 x 12 mm and is 1 mm deep.  There is no redness no odor no exposed tendon or bone.  Imaging: No results found. No images are attached to the encounter.  Labs: Lab Results  Component Value Date   HGBA1C 10.1 07/04/2017   HGBA1C 11.4 05/07/2017   HGBA1C 9.4 01/20/2017   ESRSEDRATE 50 (H) 08/08/2017   ESRSEDRATE 22 (H) 06/24/2016   ESRSEDRATE 27 (H) 06/08/2016   CRP <0.8 06/24/2016   CRP 0.5 02/20/2016   CRP 0.8 12/31/2015   REPTSTATUS 08/12/2017 FINAL 08/07/2017   GRAMSTAIN  02/21/2016    RARE WBC PRESENT, PREDOMINANTLY MONONUCLEAR RARE GRAM POSITIVE COCCI IN PAIRS    CULT  08/07/2017    NO GROWTH 5 DAYS Performed at [facility], [place] [address], [place], [place]  [address]    LABORGA STAPHYLOCOCCUS AUREUS 02/21/2016    @LABSALLVALUES (HGBA1)@  Body mass index is 31.95 kg/m.  Orders:  No orders of the defined types were placed in this encounter.  No orders of the defined types were placed in this encounter.    Procedures: No procedures performed  Clinical Data: No additional findings.  ROS:  All other systems negative, except as noted in the HPI. Review of Systems  Objective: Vital Signs: Ht 5\' 7"  (1.702 m)   Wt 204 lb (92.5 kg)   BMI 31.95 kg/m   Specialty Comments:  No specialty comments available.  PMFS History: Patient Active Problem List   Diagnosis Date Noted  . Midfoot skin ulcer, right, limited to breakdown of skin ([place]) 08/14/2017  . Hypertension, uncontrolled 08/08/2017  . Cellulitis of right foot 08/07/2017  . Bulging lumbar disc 07/04/2017  . Weakness of right lower extremity 05/19/2017  . Cellulitis 05/18/2017  . Achilles tendon contracture, left 01/14/2017  . Gingivitis 11/14/2016  . Achilles tendon contracture, right 09/17/2016  . Pain and swelling of left lower leg 09/16/2016  . CKD (chronic kidney disease) stage 3, GFR 30-59 ml/min (HCC) 08/27/2016  . Dyslipidemia associated with type 2 diabetes mellitus ([place]) 07/22/2016  . Constipation 07/02/2016  . S/P transmetatarsal amputation of foot, left ([place])  07/02/2016  . Dyspnea on exertion 07/02/2016  . Diabetic gastroparesis ([place]) 05/30/2016  . Buzzing in ear, right 03/26/2016  . Joint pain 03/11/2016  . Weakness 03/08/2016  . Dizziness 03/03/2016  . Chronic diastolic CHF (congestive heart failure), NYHA class 1 ([place]) 12/30/2015  . Diabetes mellitus type 2, uncontrolled, with complications ([place]) 83/41/9622  . Depression with anxiety 12/01/2015  . Pain in the chest 12/01/2015  . Insomnia 11/21/2015  . GERD (gastroesophageal reflux disease) 08/18/2015  . Erectile dysfunction 07/04/2015  . Symptomatic anemia 04/24/2015  . Left arm weakness 04/02/2015  .  Sensory disturbance 04/02/2015  . Essential hypertension 04/02/2015  . Hemispheric carotid artery syndrome   . HLD (hyperlipidemia)   . Headache    Past Medical History:  Diagnosis Date  . Anemia   . Bipolar disorder ([place])   . Cellulitis 05/2017   RIGHT FOOT   DIABETIC ULCER   . Cellulitis and abscess of left leg 06/2016  . Chest pain    a. 2015 Reportedly normal stress test in [place].  Marland Kitchen Chronic diastolic CHF (congestive heart failure) (HCC)    a.03/2015 Echo: EF 55-60%, Gr 1 DD, mild MR, triv PR.  . Depression   . Depression with anxiety   . Dyspnea    with exertion  . GERD (gastroesophageal reflux disease)   . Glaucoma   . Hyperlipidemia   . Hypertension    a. 08/2014 Admitted with hypertensive urgency.  . Insomnia   . Internal carotid artery stenosis   . Lower GI bleed 07/04/2015  . Neuropathy   . Refusal of blood transfusions as patient is Jehovah's Witness   . TIA (transient ischemic attack) 08/2014; 03/2015   a. 08/2014 in setting of hypertensive urgency.  . Type II diabetes mellitus ([place])    started insulin spring 2016, Type II  . Vitamin D deficiency spring 2016    Family History  Problem Relation Age of Onset  . Hypertension Mother   . Diabetes Mother   . Hyperlipidemia Mother   . Heart disease Mother        s/p pacemaker  . Diabetes Father   . Hypertension Father   . Stroke Father   . Heart attack Father        first MI @ 40.  . Depression Father   . Dementia Father   . Stroke Brother   . ADD / ADHD Brother   . Anxiety disorder Brother   . Bipolar disorder Brother   . OCD Brother   . Sexual abuse Brother     Past Surgical History:  Procedure Laterality Date  . AMPUTATION Left 08/03/2015   Procedure: AMPUTATION LEFT GREAT TOE;  Surgeon: Newt Minion, MD;  Location: [facility];  Service: Orthopedics;  Laterality: Left;  . AMPUTATION Left 12/02/2015   Procedure: amputation of left 2nd digit  . AMPUTATION Left 04/10/2016   Procedure: Left 2nd Toe Amputation  at MTP Joint;  Surgeon: Newt Minion, MD;  Location: [facility];  Service: Orthopedics;  Laterality: Left;  . AMPUTATION Left 06/09/2016   Procedure: AMPUTATION LEFT THIRD TOE;  Surgeon: [name], MD;  Location: [facility];  Service: Orthopedics;  Laterality: Left;  . AMPUTATION Left 06/26/2016   Procedure: Left Foot Transmetatarsal Amputation;  Surgeon: Newt Minion, MD;  Location: [facility];  Service: Orthopedics;  Laterality: Left;  . APPLICATION OF WOUND VAC Left 12/19/2015   Procedure: APPLICATION OF WOUND VAC;  Surgeon: Meredith Pel, MD;  Location: [facility];  Service: Orthopedics;  Laterality: Left;  . CIRCUMCISION    . COLONOSCOPY N/A 01/22/2016   Procedure: COLONOSCOPY;  Surgeon: Gatha Mayer, MD;  Location: [facility];  Service: Endoscopy;  Laterality: N/A;  . ESOPHAGOGASTRODUODENOSCOPY N/A 01/19/2016   Procedure: ESOPHAGOGASTRODUODENOSCOPY (EGD);  Surgeon: Manus Gunning, MD;  Location: [facility];  Service: Gastroenterology;  Laterality: N/A;  . ESOPHAGOGASTRODUODENOSCOPY N/A 01/22/2016   Procedure: ESOPHAGOGASTRODUODENOSCOPY (EGD);  Surgeon: Gatha Mayer, MD;  Location: [facility] ENDOSCOPY;  Service: Endoscopy;  Laterality: N/A;  . I&D EXTREMITY Left 12/02/2015   Procedure: IRRIGATION AND DEBRIDEMENT OF FOOT; LEFT SECOND TOE AMPUTATION;  Surgeon: Meredith Pel, MD;  Location: [facility];  Service: Orthopedics;  Laterality: Left;  . I&D EXTREMITY Left 12/19/2015   Procedure: I & D LEFT FOOT WITH BEADS ;  Surgeon: Meredith Pel, MD;  Location: [facility];  Service: Orthopedics;  Laterality: Left;  . I&D EXTREMITY Right 01/17/2016   Procedure: IRRIGATION AND DEBRIDEMENT RIGHT FOOT;  Surgeon: Newt Minion, MD;  Location: [facility];  Service: Orthopedics;  Laterality: Right;  . INCISION AND DRAINAGE FOOT Right 01/17/2016  . INGUINAL HERNIA REPAIR Bilateral ~ 1983- ~ 1986  . TONSILLECTOMY  ~ 26   Social History   Occupational History  . Occupation: disabled  Tobacco Use  . Smoking status:  Never Smoker  . Smokeless tobacco: Never Used  Substance and Sexual Activity  . Alcohol use: No    Alcohol/week: 0.0 oz  . Drug use: No  . Sexual activity: No

## 2017-08-28 NOTE — Progress Notes (Signed)
Patient here for lab visit only 

## 2017-08-29 LAB — CMP14+EGFR
ALBUMIN: 3.8 g/dL (ref 3.5–5.5)
ALK PHOS: 82 IU/L (ref 39–117)
ALT: 47 IU/L — ABNORMAL HIGH (ref 0–44)
AST: 27 IU/L (ref 0–40)
Albumin/Globulin Ratio: 1.3 (ref 1.2–2.2)
BUN/Creatinine Ratio: 13 (ref 9–20)
BUN: 21 mg/dL (ref 6–24)
Bilirubin Total: <0.2 mg/dL (ref 0.0–1.2)
CO2: 22 mmol/L (ref 20–29)
CREATININE: 1.66 mg/dL — AB (ref 0.76–1.27)
Calcium: 9.4 mg/dL (ref 8.7–10.2)
Chloride: 110 mmol/L — ABNORMAL HIGH (ref 96–106)
GFR calc Af Amer: 56 mL/min/{1.73_m2} — ABNORMAL LOW (ref >59)
GFR calc non Af Amer: 48 mL/min/{1.73_m2} — ABNORMAL LOW (ref >59)
GLOBULIN, TOTAL: 3 g/dL (ref 1.5–4.5)
Glucose: 155 mg/dL — ABNORMAL HIGH (ref 65–99)
Potassium: 4.9 mmol/L (ref 3.5–5.2)
SODIUM: 146 mmol/L — AB (ref 134–144)
TOTAL PROTEIN: 6.8 g/dL (ref 6.0–8.5)

## 2017-09-08 MED FILL — TOPIRAMATE 50 MG TABLET: 50 | 30 days supply | Qty: 30 | Fill #1

## 2017-09-08 MED FILL — ATORVASTATIN 80 MG TABLET: 80 | 30 days supply | Qty: 30 | Fill #5

## 2017-09-09 MED FILL — GABAPENTIN 300 MG CAPSULE: 300 | 30 days supply | Qty: 60 | Fill #2

## 2017-09-11 MED FILL — FLUoxetine HCL 40 MG CAPS: 40 | 30 days supply | Qty: 30 | Fill #1

## 2017-09-22 MED FILL — TRUE METRIX TEST STRIP: 30 days supply | Qty: 100 | Fill #5

## 2017-09-22 MED FILL — ?CARVEDILOL 25 MG TABLET: 25 | 30 days supply | Qty: 60 | Fill #1

## 2017-09-25 ENCOUNTER — Encounter (INDEPENDENT_AMBULATORY_CARE_PROVIDER_SITE_OTHER): Payer: Self-pay | Admitting: Orthopedic Surgery

## 2017-09-25 ENCOUNTER — Ambulatory Visit (INDEPENDENT_AMBULATORY_CARE_PROVIDER_SITE_OTHER): Payer: Self-pay | Admitting: Orthopedic Surgery

## 2017-09-25 DIAGNOSIS — L97411 Non-pressure chronic ulcer of right heel and midfoot limited to breakdown of skin: Secondary | ICD-10-CM

## 2017-09-25 NOTE — Progress Notes (Signed)
Office Visit Note   Patient: Shane [name]           Date of Birth: 1971/02/25           MRN: [id number] Visit Date: 09/25/2017              Requested by: Charlott Rakes, MD [place], [place] [address] PCP: Charlott Rakes, MD  Chief Complaint  Patient presents with  . Right Foot - Follow-up      HPI: Patient is a 47 year old gentleman who presents in follow-up for ulceration right foot he is currently wearing the medical compression stocks he denies any pain or symptoms.  Assessment & Plan: Visit Diagnoses:  1. Midfoot skin ulcer, right, limited to breakdown of skin [facility])     Plan: Patient will advance to regular shoewear continue with the medical compression sock follow-up in 3 months.  Follow-Up Instructions: Return in about 3 months (around 12/25/2017).   Ortho Exam  Patient is alert, oriented, no adenopathy, well-dressed, normal affect, normal respiratory effort. Examination patient's foot is plantigrade.  The wound is essentially healed there is good epithelialization around the wound edges.  Good granulation tissue there is some swelling there is no cellulitis no signs of infection.  Imaging: No results found.   Labs: Lab Results  Component Value Date   HGBA1C 10.1 07/04/2017   HGBA1C 11.4 05/07/2017   HGBA1C 9.4 01/20/2017   ESRSEDRATE 50 (H) 08/08/2017   ESRSEDRATE 22 (H) 06/24/2016   ESRSEDRATE 27 (H) 06/08/2016   CRP <0.8 06/24/2016   CRP 0.5 02/20/2016   CRP 0.8 12/31/2015   REPTSTATUS 08/12/2017 FINAL 08/07/2017   GRAMSTAIN  02/21/2016    RARE WBC PRESENT, PREDOMINANTLY MONONUCLEAR RARE GRAM POSITIVE COCCI IN PAIRS    CULT  08/07/2017    NO GROWTH 5 DAYS Performed at [facility], [place] [address], [place],  [address]    [place] 02/21/2016    @LABSALLVALUES (HGBA1)@  There is no height or weight on file to calculate BMI.  Orders:  No orders of the defined types were placed in this  encounter.  No orders of the defined types were placed in this encounter.    Procedures: No procedures performed  Clinical Data: No additional findings.  ROS:  All other systems negative, except as noted in the HPI. Review of Systems  Objective: Vital Signs: There were no vitals taken for this visit.  Specialty Comments:  No specialty comments available.  PMFS History: Patient Active Problem List   Diagnosis Date Noted  . Midfoot skin ulcer, right, limited to breakdown of skin ([place]) 08/14/2017  . Hypertension, uncontrolled 08/08/2017  . Cellulitis of right foot 08/07/2017  . Bulging lumbar disc 07/04/2017  . Weakness of right lower extremity 05/19/2017  . Cellulitis 05/18/2017  . Achilles tendon contracture, left 01/14/2017  . Gingivitis 11/14/2016  . Achilles tendon contracture, right 09/17/2016  . Pain and swelling of left lower leg 09/16/2016  . CKD (chronic kidney disease) stage 3, GFR 30-59 ml/min (HCC) 08/27/2016  . Dyslipidemia associated with type 2 diabetes mellitus ([place]) 07/22/2016  . Constipation 07/02/2016  . S/P transmetatarsal amputation of foot, left ([place]) 07/02/2016  . Dyspnea on exertion 07/02/2016  . Diabetic gastroparesis ([place]) 05/30/2016  . Buzzing in ear, right 03/26/2016  . Joint pain 03/11/2016  . Weakness 03/08/2016  . Dizziness 03/03/2016  . Chronic diastolic CHF (congestive heart failure), NYHA class 1 ([place]) 12/30/2015  . Diabetes mellitus type 2, uncontrolled, with  complications ([place]) 63/06/6008  . Depression with anxiety 12/01/2015  . Pain in the chest 12/01/2015  . Insomnia 11/21/2015  . GERD (gastroesophageal reflux disease) 08/18/2015  . Erectile dysfunction 07/04/2015  . Symptomatic anemia 04/24/2015  . Left arm weakness 04/02/2015  . Sensory disturbance 04/02/2015  . Essential hypertension 04/02/2015  . Hemispheric carotid artery syndrome   . HLD (hyperlipidemia)   . Headache    Past Medical History:  Diagnosis Date  .  Anemia   . Bipolar disorder ([place])   . Cellulitis 05/2017   RIGHT FOOT   DIABETIC ULCER   . Cellulitis and abscess of left leg 06/2016  . Chest pain    a. 2015 Reportedly normal stress test in [place].  Marland Kitchen Chronic diastolic CHF (congestive heart failure) (HCC)    a.03/2015 Echo: EF 55-60%, Gr 1 DD, mild MR, triv PR.  . Depression   . Depression with anxiety   . Dyspnea    with exertion  . GERD (gastroesophageal reflux disease)   . Glaucoma   . Hyperlipidemia   . Hypertension    a. 08/2014 Admitted with hypertensive urgency.  . Insomnia   . Internal carotid artery stenosis   . Lower GI bleed 07/04/2015  . Neuropathy   . Refusal of blood transfusions as patient is Jehovah's Witness   . TIA (transient ischemic attack) 08/2014; 03/2015   a. 08/2014 in setting of hypertensive urgency.  . Type II diabetes mellitus ([place])    started insulin spring 2016, Type II  . Vitamin D deficiency spring 2016    Family History  Problem Relation Age of Onset  . Hypertension Mother   . Diabetes Mother   . Hyperlipidemia Mother   . Heart disease Mother        s/p pacemaker  . Diabetes Father   . Hypertension Father   . Stroke Father   . Heart attack Father        first MI @ 8.  . Depression Father   . Dementia Father   . Stroke Brother   . ADD / ADHD Brother   . Anxiety disorder Brother   . Bipolar disorder Brother   . OCD Brother   . Sexual abuse Brother     Past Surgical History:  Procedure Laterality Date  . AMPUTATION Left 08/03/2015   Procedure: AMPUTATION LEFT GREAT TOE;  Surgeon: Newt Minion, MD;  Location: [facility];  Service: Orthopedics;  Laterality: Left;  . AMPUTATION Left 12/02/2015   Procedure: amputation of left 2nd digit  . AMPUTATION Left 04/10/2016   Procedure: Left 2nd Toe Amputation at MTP Joint;  Surgeon: Newt Minion, MD;  Location: [facility];  Service: Orthopedics;  Laterality: Left;  . AMPUTATION Left 06/09/2016   Procedure: AMPUTATION LEFT THIRD TOE;  Surgeon: [name],  MD;  Location: [facility];  Service: Orthopedics;  Laterality: Left;  . AMPUTATION Left 06/26/2016   Procedure: Left Foot Transmetatarsal Amputation;  Surgeon: Newt Minion, MD;  Location: [facility];  Service: Orthopedics;  Laterality: Left;  . APPLICATION OF WOUND VAC Left 12/19/2015   Procedure: APPLICATION OF WOUND VAC;  Surgeon: Meredith Pel, MD;  Location: [facility];  Service: Orthopedics;  Laterality: Left;  . CIRCUMCISION    . COLONOSCOPY N/A 01/22/2016   Procedure: COLONOSCOPY;  Surgeon: Gatha Mayer, MD;  Location: [facility];  Service: Endoscopy;  Laterality: N/A;  . ESOPHAGOGASTRODUODENOSCOPY N/A 01/19/2016   Procedure: ESOPHAGOGASTRODUODENOSCOPY (EGD);  Surgeon: Manus Gunning, MD;  Location:  [facility]COPY;  Service: Gastroenterology;  Laterality: N/A;  . ESOPHAGOGASTRODUODENOSCOPY N/A 01/22/2016   Procedure: ESOPHAGOGASTRODUODENOSCOPY (EGD);  Surgeon: Gatha Mayer, MD;  Location: [facility] ENDOSCOPY;  Service: Endoscopy;  Laterality: N/A;  . I&D EXTREMITY Left 12/02/2015   Procedure: IRRIGATION AND DEBRIDEMENT OF FOOT; LEFT SECOND TOE AMPUTATION;  Surgeon: Meredith Pel, MD;  Location: [facility];  Service: Orthopedics;  Laterality: Left;  . I&D EXTREMITY Left 12/19/2015   Procedure: I & D LEFT FOOT WITH BEADS ;  Surgeon: Meredith Pel, MD;  Location: [facility];  Service: Orthopedics;  Laterality: Left;  . I&D EXTREMITY Right 01/17/2016   Procedure: IRRIGATION AND DEBRIDEMENT RIGHT FOOT;  Surgeon: Newt Minion, MD;  Location: [facility];  Service: Orthopedics;  Laterality: Right;  . INCISION AND DRAINAGE FOOT Right 01/17/2016  . INGUINAL HERNIA REPAIR Bilateral ~ 1983- ~ 1986  . TONSILLECTOMY  ~ 35   Social History   Occupational History  . Occupation: disabled  Tobacco Use  . Smoking status: Never Smoker  . Smokeless tobacco: Never Used  Substance and Sexual Activity  . Alcohol use: No    Alcohol/week: 0.0 oz  . Drug use: No  . Sexual activity: Never

## 2017-09-26 ENCOUNTER — Other Ambulatory Visit: Payer: Self-pay

## 2017-09-29 ENCOUNTER — Other Ambulatory Visit: Payer: Self-pay

## 2017-09-29 ENCOUNTER — Ambulatory Visit: Payer: Self-pay | Admitting: Family Medicine

## 2017-10-06 ENCOUNTER — Other Ambulatory Visit: Payer: Self-pay

## 2017-10-06 MED FILL — ATORVASTATIN 80 MG TABLET: 80 | 30 days supply | Qty: 30 | Fill #0

## 2017-10-06 MED FILL — FLUoxetine HCL 40 MG CAPS: 40 | 30 days supply | Qty: 30 | Fill #0

## 2017-10-06 MED FILL — traZODone HCL 100 MG TABS: 100 | 30 days supply | Qty: 60 | Fill #1

## 2017-10-06 MED FILL — glipiZIDE XL 10 MG TB24: 10 | 30 days supply | Qty: 60 | Fill #3

## 2017-10-10 MED FILL — ?PANTOPRAZOLE SO DR 40MG TA: 40 | 30 days supply | Qty: 60 | Fill #3

## 2017-10-15 ENCOUNTER — Encounter (HOSPITAL_COMMUNITY): Payer: Self-pay | Admitting: Psychiatry

## 2017-10-15 ENCOUNTER — Ambulatory Visit (INDEPENDENT_AMBULATORY_CARE_PROVIDER_SITE_OTHER): Payer: Medicaid Other | Admitting: Psychiatry

## 2017-10-15 VITALS — BP 143/89 | HR 82 | Ht 67.0 in | Wt 202.0 lb

## 2017-10-15 DIAGNOSIS — Z79899 Other long term (current) drug therapy: Secondary | ICD-10-CM

## 2017-10-15 DIAGNOSIS — F5104 Psychophysiologic insomnia: Secondary | ICD-10-CM | POA: Diagnosis not present

## 2017-10-15 DIAGNOSIS — F418 Other specified anxiety disorders: Secondary | ICD-10-CM | POA: Diagnosis not present

## 2017-10-15 DIAGNOSIS — F331 Major depressive disorder, recurrent, moderate: Secondary | ICD-10-CM

## 2017-10-15 DIAGNOSIS — Z6379 Other stressful life events affecting family and household: Secondary | ICD-10-CM | POA: Diagnosis not present

## 2017-10-15 DIAGNOSIS — Z818 Family history of other mental and behavioral disorders: Secondary | ICD-10-CM | POA: Diagnosis not present

## 2017-10-15 MED ORDER — CARBAMAZEPINE 200 MG PO TABS: 200.0000 mg | ORAL_TABLET | Freq: Every day | ORAL | 0 refills | Status: DC

## 2017-10-15 MED FILL — ?CARBAMAZEPINE 200MG TAB: 200 | 30 days supply | Qty: 30 | Fill #0

## 2017-10-15 NOTE — Progress Notes (Signed)
[facility] MD/PA/NP OP Progress Note  10/15/2017 1:24 PM Shane [name]  MRN:  [id number]  Chief Complaint: med management HPI: Shane [name] reports that the Topamax has been helpful in having some calming effect on him, but he is noticed that he has fairly loose stools and diarrhea.  I suggested he also make sure to check in with his primary care physician to make sure there is no other medical etiologies contributing to diarrhea given that he recently was on antibiotics for a foot infection.  He was receptive to this.  We agreed to discontinue Topamax and initiate Tegretol 200 mg nightly given the similar GABAergic activity and general calming effects, and I also suspect this will help with some of his neuropathic pain.  We reviewed the risks and benefits of Tegretol and agreed to follow-up in 8 weeks.  He continues on Prozac and trazodone and feels that they do generally help him and his mood and insomnia.  Denies any suicidal thoughts.  Reports that things have been good with him and his wife.  Visit Diagnosis:    ICD-10-CM   1. Depression with anxiety F41.8 carbamazepine (TEGRETOL) 200 MG tablet  2. Psychophysiological insomnia F51.04 carbamazepine (TEGRETOL) 200 MG tablet  3. Moderate episode of recurrent major depressive disorder ([place]) F33.1     Past Psychiatric History: See intake H&P for full details. Reviewed, with no updates at this time.   Past Medical History:  Past Medical History:  Diagnosis Date  . Anemia   . Bipolar disorder ([place])   . Cellulitis 05/2017   RIGHT FOOT   DIABETIC ULCER   . Cellulitis and abscess of left leg 06/2016  . Chest pain    a. 2015 Reportedly normal stress test in [place].  Marland Kitchen Chronic diastolic CHF (congestive heart failure) (HCC)    a.03/2015 Echo: EF 55-60%, Gr 1 DD, mild MR, triv PR.  . Depression   . Depression with anxiety   . Dyspnea    with exertion  . GERD (gastroesophageal reflux disease)   . Glaucoma   . Hyperlipidemia   . Hypertension    a.  08/2014 Admitted with hypertensive urgency.  . Insomnia   . Internal carotid artery stenosis   . Lower GI bleed 07/04/2015  . Neuropathy   . Refusal of blood transfusions as patient is Jehovah's Witness   . TIA (transient ischemic attack) 08/2014; 03/2015   a. 08/2014 in setting of hypertensive urgency.  . Type II diabetes mellitus ([place])    started insulin spring 2016, Type II  . Vitamin D deficiency spring 2016    Past Surgical History:  Procedure Laterality Date  . AMPUTATION Left 08/03/2015   Procedure: AMPUTATION LEFT GREAT TOE;  Surgeon: Newt Minion, MD;  Location: [facility];  Service: Orthopedics;  Laterality: Left;  . AMPUTATION Left 12/02/2015   Procedure: amputation of left 2nd digit  . AMPUTATION Left 04/10/2016   Procedure: Left 2nd Toe Amputation at MTP Joint;  Surgeon: Newt Minion, MD;  Location: [facility];  Service: Orthopedics;  Laterality: Left;  . AMPUTATION Left 06/09/2016   Procedure: AMPUTATION LEFT THIRD TOE;  Surgeon: [name], MD;  Location: [facility];  Service: Orthopedics;  Laterality: Left;  . AMPUTATION Left 06/26/2016   Procedure: Left Foot Transmetatarsal Amputation;  Surgeon: Newt Minion, MD;  Location: [facility];  Service: Orthopedics;  Laterality: Left;  . APPLICATION OF WOUND VAC Left 12/19/2015   Procedure: APPLICATION OF WOUND VAC;  Surgeon: Meredith Pel, MD;  Location: [facility];  Service: Orthopedics;  Laterality: Left;  . CIRCUMCISION    . COLONOSCOPY N/A 01/22/2016   Procedure: COLONOSCOPY;  Surgeon: Gatha Mayer, MD;  Location: [facility];  Service: Endoscopy;  Laterality: N/A;  . ESOPHAGOGASTRODUODENOSCOPY N/A 01/19/2016   Procedure: ESOPHAGOGASTRODUODENOSCOPY (EGD);  Surgeon: Manus Gunning, MD;  Location: [facility];  Service: Gastroenterology;  Laterality: N/A;  . ESOPHAGOGASTRODUODENOSCOPY N/A 01/22/2016   Procedure: ESOPHAGOGASTRODUODENOSCOPY (EGD);  Surgeon: Gatha Mayer, MD;  [facility] ENDOSCOPY;  Service: Endoscopy;  Laterality: N/A;   . I&D EXTREMITY Left 12/02/2015   Procedure: IRRIGATION AND DEBRIDEMENT OF FOOT; LEFT SECOND TOE AMPUTATION;  Surgeon: Meredith Pel, MD;  Location: [facility];  Service: Orthopedics;  Laterality: Left;  . I&D EXTREMITY Left 12/19/2015   Procedure: I & D LEFT FOOT WITH BEADS ;  Surgeon: Meredith Pel, MD;  Location: [facility];  Service: Orthopedics;  Laterality: Left;  . I&D EXTREMITY Right 01/17/2016   Procedure: IRRIGATION AND DEBRIDEMENT RIGHT FOOT;  Surgeon: Newt Minion, MD;  Location: [facility];  Service: Orthopedics;  Laterality: Right;  . INCISION AND DRAINAGE FOOT Right 01/17/2016  . INGUINAL HERNIA REPAIR Bilateral ~ 1983- ~ 1986  . TONSILLECTOMY  ~ 5    Family Psychiatric History: See intake H&P for full details. Reviewed, with no updates at this time.   Family History:  Family History  Problem Relation Age of Onset  . Hypertension Mother   . Diabetes Mother   . Hyperlipidemia Mother   . Heart disease Mother        s/p pacemaker  . Diabetes Father   . Hypertension Father   . Stroke Father   . Heart attack Father        first MI @ 78.  . Depression Father   . Dementia Father   . Stroke Brother   . ADD / ADHD Brother   . Anxiety disorder Brother   . Bipolar disorder Brother   . OCD Brother   . Sexual abuse Brother     Social History:  Social History   Socioeconomic History  . Marital status: Married    Spouse name: Not on file  . Number of children: 1  . Years of education: Not on file  . Highest education level: Not on file  Occupational History  . Occupation: disabled  Social Needs  . Financial resource strain: Not on file  . Food insecurity:    Worry: Not on file    Inability: Not on file  . Transportation needs:    Medical: Not on file    Non-medical: Not on file  Tobacco Use  . Smoking status: Never Smoker  . Smokeless tobacco: Never Used  Substance and Sexual Activity  . Alcohol use: No    Alcohol/week: 0.0 oz  . Drug use: No  . Sexual  activity: Never  Lifestyle  . Physical activity:    Days per week: Not on file    Minutes per session: Not on file  . Stress: Not on file  Relationships  . Social connections:    Talks on phone: Not on file    Gets together: Not on file    Attends religious service: Not on file    Active member of club or organization: Not on file    Attends meetings of clubs or organizations: Not on file    Relationship status: Not on file  Other Topics Concern  . Not on file  Social History Narrative  Lives in [place] with wife.  Active but doesn't routinely exercise.    Allergies:  Allergies  Allergen Reactions  . Lactose Intolerance ([place]) Diarrhea and Other (See Comments)    Bloating (also)  . Other Other (See Comments)    Red meat causes stomach pains, bloating and diarrhea  . Milk-Related Compounds Diarrhea and Other (See Comments)    Any dairy products  - diarrhea and bloating    Metabolic Disorder Labs: Lab Results  Component Value Date   HGBA1C 10.1 07/04/2017   MPG 203 06/08/2016   MPG 177 03/09/2016   No results found for: PROLACTIN Lab Results  Component Value Date   CHOL 137 05/12/2017   TRIG 86 05/12/2017   HDL 41 05/12/2017   CHOLHDL 3.3 05/12/2017   VLDL 16 03/09/2016   LDLCALC 79 05/12/2017   LDLCALC 54 03/09/2016   Lab Results  Component Value Date   TSH 0.408 12/31/2015    Therapeutic Level Labs: No results found for: LITHIUM Lab Results  Component Value Date   VALPROATE 21 (L) 03/08/2016   VALPROATE 19 (L) 04/24/2015   No components found for:  CBMZ  Current Medications: Current Outpatient Medications  Medication Sig Dispense Refill  . acetaminophen (TYLENOL) 325 MG tablet Take 2 tablets (650 mg total) by mouth every 6 (six) hours as needed for mild pain, moderate pain or fever (or Fever >/= 101).    Marland Kitchen amLODipine (NORVASC) 10 MG tablet Take 1 tablet (10 mg total) by mouth daily. 30 tablet 11  . aspirin EC 81 MG tablet Take 1 tablet (81 mg total) by  mouth daily. 30 tablet 11  . atorvastatin (LIPITOR) 80 MG tablet Take 1 tablet (80 mg total) by mouth every morning. (Patient taking differently: Take 80 mg by mouth daily. ) 30 tablet 5  . Blood Glucose Monitoring Suppl (TRUE METRIX METER) DEVI 1 each by Does not apply route 3 (three) times daily. 1 Device 0  . carvedilol (COREG) 25 MG tablet TAKE 1 TABLET BY MOUTH 2 TIMES DAILY WITH A MEAL (Patient taking differently: Take 25 mg by mouth 2 (two) times daily with a meal. ) 60 tablet 5  . doxycycline (VIBRA-TABS) 100 MG tablet Take 1 tablet (100 mg total) by mouth 2 (two) times daily. 20 tablet 0  . Elastic Bandages & Supports (T.E.D. KNEE LENGTH/S-REGULAR) MISC Wear when awake 1 each 0  . FLUoxetine (PROZAC) 40 MG capsule Take 1 capsule (40 mg total) by mouth daily. 90 capsule 1  . fluticasone (FLONASE) 50 MCG/ACT nasal spray Place 2 sprays into both nostrils daily as needed for allergies or rhinitis.    . folic acid (FOLVITE) 1 MG tablet Take 1 tablet (1 mg total) by mouth daily. 30 tablet 3  . gabapentin (NEURONTIN) 300 MG capsule Take 1 capsule (300 mg total) by mouth 2 (two) times daily. 60 capsule 5  . glipiZIDE (GLUCOTROL XL) 10 MG 24 hr tablet Take 1 tablet (10 mg total) by mouth daily with breakfast.    . glucose blood (ACCU-CHEK AVIVA PLUS) test strip Use as instructed 100 each 12  . glucose blood (TRUE METRIX BLOOD GLUCOSE TEST) test strip 1 each by Other route 3 (three) times daily. 100 each 12  . Insulin Glargine (LANTUS SOLOSTAR) 100 UNIT/ML Solostar Pen Inject 30 Units into the skin 2 (two) times daily. 30 mL 3  . isosorbide mononitrate (IMDUR) 30 MG 24 hr tablet Take 1 tablet (30 mg total) by mouth daily. 30 tablet 3  .  nitroGLYCERIN (NITRODUR - DOSED IN MG/24 HR) 0.2 mg/hr patch Place 1 patch (0.2 mg total) onto the skin daily. 30 patch 12  . pantoprazole (PROTONIX) 40 MG tablet Take 1 tablet (40 mg total) by mouth 2 (two) times daily before a meal. 60 tablet 3  . pentoxifylline  (TRENTAL) 400 MG CR tablet Take 1 tablet (400 mg total) by mouth 3 (three) times daily with meals. 90 tablet 0  . pentoxifylline (TRENTAL) 400 MG CR tablet Take 1 tablet (400 mg total) by mouth 3 (three) times daily with meals. 90 tablet 3  . polyethylene glycol (MIRALAX / GLYCOLAX) packet Take 17 g by mouth daily as needed for moderate constipation. (Patient taking differently: Take 17 g by mouth daily as needed for moderate constipation. Mix in 8 oz liquid and drink) 14 each 0  . senna-docusate (SENOKOT-S) 8.6-50 MG tablet Take 1 tablet by mouth at bedtime. 60 tablet 2  . silver sulfADIAZINE (SILVADENE) 1 % cream Apply topically 2 (two) times daily. Apply to wound on top of your right foot. Cover with dry dressing. 50 g 0  . traZODone (DESYREL) 100 MG tablet Take 2 tablets (200 mg total) by mouth at bedtime. 180 tablet 1  . Vitamin D, Ergocalciferol, (DRISDOL) 50000 units CAPS capsule Take 1 capsule (50,000 Units total) by mouth See admin instructions. Take one capsule (50000 units) by mouth on the 1st Sunday of each month    . carbamazepine (TEGRETOL) 200 MG tablet Take 1 tablet (200 mg total) by mouth at bedtime. 90 tablet 0   No current facility-administered medications for this visit.      Musculoskeletal: Strength & Muscle Tone: within normal limits Gait & Station: normal Patient leans: N/A  Psychiatric Specialty Exam: ROS  Blood pressure (!) 143/89, pulse 82, height 5\' 7"  (1.702 m), weight 202 lb (91.6 kg), SpO2 98 %.Body mass index is 31.64 kg/m.  General Appearance: Casual  Eye Contact:  Good  Speech:  Clear and Coherent and Normal Rate  Volume:  Normal  Mood:  Dysphoric  Affect:  Congruent  Thought Process:  Coherent and Descriptions of Associations: Intact  Orientation:  Full (Time, Place, and Person)  Thought Content: Logical   Suicidal Thoughts:  No  Homicidal Thoughts:  No  Memory:  Immediate;   Fair  Judgement:  Fair  Insight:  Fair  Psychomotor Activity:  Normal   Concentration:  Concentration: Fair  Recall:  [place] of Knowledge: Good  Language: Good  Akathisia:  Negative  Handed:  Right  AIMS (if indicated): not done  Assets:  Communication Skills Desire for Improvement Financial Resources/Insurance Housing  ADL's:  Intact  Cognition: WNL  Sleep:  Fair   Screenings: GAD-7     Office Visit from 08/07/2017 in [facility] Office Visit from 07/04/2017 in [facility] Office Visit from 05/07/2017 in [facility] Office Visit from 01/20/2017 in [facility] Office Visit from 11/12/2016 in [facility]  Total GAD-7 Score  0  0  2  0  0    PHQ2-9     Office Visit from 08/07/2017 in [facility] Office Visit from 07/04/2017 in [facility] Office Visit from 05/07/2017 in [facility] Office Visit from 01/20/2017 in [facility] Office Visit from 11/12/2016 in [facility]  PHQ-2 Total Score  1  0  2  3  2   PHQ-9 Total Score  7  5  10  13  12        Assessment and Plan:  Shane [name] presents for medication management follow-up.  Recent stressors related to his medical health and foot infection, he was hospitalized.  He reports that the Topamax has been helpful for his anxiety and sleep, but he has noticed that he has more diarrhea and has to get up in the middle of the night to use the restroom.  He has stopped the Topamax for a few days to see if this resolved, and then things got better.  Whenever he restarts that those symptoms return.  We agreed to switch him to Tegretol for some mood stabilizing benefit and help with neuropathic pain.  He continues on Prozac and trazodone as below.  No acute safety issues and we will follow-up in 3 months.  1. Depression with anxiety    2. Psychophysiological insomnia   3. Moderate episode of recurrent major depressive disorder (HCC)     Status of current problems: stable  Labs Ordered: No orders of the defined types were placed in this encounter.   Labs Reviewed: n/a  Collateral Obtained/Records Reviewed: na  Plan:  Continue Prozac 40 mg daily and trazodone 200 mg nightly Discontinue Topamax in favor of Tegretol 200 mg nightly Okay to increase Tegretol to 200 mg twice daily or 400 mg nightly if tolerated  I spent 20 minutes with the patient in direct face-to-face clinical care.  Greater than 50% of this time was spent in counseling and coordination of care with the patient.    Aundra Dubin, MD 10/15/2017, 1:24 PM

## 2017-10-17 MED FILL — TRUE METRIX TEST STRIP: 30 days supply | Qty: 100 | Fill #6

## 2017-11-04 MED FILL — FLUoxetine HCL 40 MG CAPS: 40 | 30 days supply | Qty: 30 | Fill #1

## 2017-11-11 MED FILL — $LANTUS SOLOSTAR 100 UNITS/: 100 | 25 days supply | Qty: 15 | Fill #0

## 2017-11-11 MED FILL — ?CARBAMAZEPINE 200MG TAB: 200 | 30 days supply | Qty: 30 | Fill #1

## 2017-11-20 ENCOUNTER — Ambulatory Visit: Payer: Medicaid Other | Attending: Family Medicine | Admitting: Family Medicine

## 2017-11-20 ENCOUNTER — Encounter: Payer: Self-pay | Admitting: Family Medicine

## 2017-11-20 VITALS — BP 146/90 | HR 79 | Temp 97.4°F | Ht 67.0 in | Wt 202.6 lb

## 2017-11-20 DIAGNOSIS — Z8673 Personal history of transient ischemic attack (TIA), and cerebral infarction without residual deficits: Secondary | ICD-10-CM | POA: Insufficient documentation

## 2017-11-20 DIAGNOSIS — K219 Gastro-esophageal reflux disease without esophagitis: Secondary | ICD-10-CM | POA: Insufficient documentation

## 2017-11-20 DIAGNOSIS — Z794 Long term (current) use of insulin: Secondary | ICD-10-CM | POA: Diagnosis not present

## 2017-11-20 DIAGNOSIS — Z89432 Acquired absence of left foot: Secondary | ICD-10-CM | POA: Diagnosis not present

## 2017-11-20 DIAGNOSIS — Z8631 Personal history of diabetic foot ulcer: Secondary | ICD-10-CM | POA: Insufficient documentation

## 2017-11-20 DIAGNOSIS — E559 Vitamin D deficiency, unspecified: Secondary | ICD-10-CM | POA: Diagnosis not present

## 2017-11-20 DIAGNOSIS — E0842 Diabetes mellitus due to underlying condition with diabetic polyneuropathy: Secondary | ICD-10-CM

## 2017-11-20 DIAGNOSIS — G4700 Insomnia, unspecified: Secondary | ICD-10-CM | POA: Diagnosis not present

## 2017-11-20 DIAGNOSIS — E1142 Type 2 diabetes mellitus with diabetic polyneuropathy: Secondary | ICD-10-CM | POA: Insufficient documentation

## 2017-11-20 DIAGNOSIS — Z91018 Allergy to other foods: Secondary | ICD-10-CM | POA: Insufficient documentation

## 2017-11-20 DIAGNOSIS — E785 Hyperlipidemia, unspecified: Secondary | ICD-10-CM | POA: Insufficient documentation

## 2017-11-20 DIAGNOSIS — E1165 Type 2 diabetes mellitus with hyperglycemia: Secondary | ICD-10-CM

## 2017-11-20 DIAGNOSIS — I10 Essential (primary) hypertension: Secondary | ICD-10-CM | POA: Diagnosis not present

## 2017-11-20 DIAGNOSIS — E118 Type 2 diabetes mellitus with unspecified complications: Secondary | ICD-10-CM

## 2017-11-20 DIAGNOSIS — Z7982 Long term (current) use of aspirin: Secondary | ICD-10-CM | POA: Diagnosis not present

## 2017-11-20 DIAGNOSIS — H409 Unspecified glaucoma: Secondary | ICD-10-CM | POA: Insufficient documentation

## 2017-11-20 DIAGNOSIS — I5032 Chronic diastolic (congestive) heart failure: Secondary | ICD-10-CM | POA: Diagnosis not present

## 2017-11-20 DIAGNOSIS — IMO0002 Reserved for concepts with insufficient information to code with codable children: Secondary | ICD-10-CM

## 2017-11-20 DIAGNOSIS — I110 Hypertensive heart disease with heart failure: Secondary | ICD-10-CM | POA: Diagnosis not present

## 2017-11-20 DIAGNOSIS — F418 Other specified anxiety disorders: Secondary | ICD-10-CM | POA: Diagnosis not present

## 2017-11-20 DIAGNOSIS — Z79899 Other long term (current) drug therapy: Secondary | ICD-10-CM | POA: Diagnosis not present

## 2017-11-20 DIAGNOSIS — E119 Type 2 diabetes mellitus without complications: Secondary | ICD-10-CM | POA: Diagnosis present

## 2017-11-20 LAB — POCT GLYCOSYLATED HEMOGLOBIN (HGB A1C): HbA1c, POC (controlled diabetic range): 9.4 % — AB (ref 0.0–7.0)

## 2017-11-20 LAB — GLUCOSE, POCT (MANUAL RESULT ENTRY): POC Glucose: 117 mg/dl — AB (ref 70–99)

## 2017-11-20 MED ORDER — GABAPENTIN 300 MG PO CAPS
300.0000 mg | ORAL_CAPSULE | Freq: Two times a day (BID) | ORAL | 6 refills | Status: AC
Start: 2017-11-20 — End: ?

## 2017-11-20 MED ORDER — CARVEDILOL 25 MG PO TABS: 25.0000 mg | ORAL_TABLET | Freq: Two times a day (BID) | ORAL | 6 refills | Status: AC

## 2017-11-20 MED ORDER — INSULIN GLARGINE 100 UNIT/ML SOLOSTAR PEN: 35.0000 [IU] | PEN_INJECTOR | Freq: Two times a day (BID) | SUBCUTANEOUS | 6 refills | Status: AC

## 2017-11-20 MED ORDER — GLIPIZIDE ER 10 MG PO TB24: 10.0000 mg | ORAL_TABLET | Freq: Every day | ORAL | 6 refills | Status: AC

## 2017-11-20 MED FILL — CARVEDILOL 25 MG TABLET: 25 | 30 days supply | Qty: 60 | Fill #0

## 2017-11-20 NOTE — Progress Notes (Signed)
Subjective:  Patient ID: Shane [name], male    DOB: 07-21-1970  Age: 47 y.o. MRN: [id number]  CC: Diabetes and Hypertension   HPI Shane [name] is a 47 year old male with a history of type 2 diabetes mellitus (A1c 9.4), s/p L transmetatarsal amputation, hypertension, diabetic neuropathy, depression and anxiety here for a follow up visit. His A1c was previously 10.1 but is now 9.4.  He informs me he discontinued glipizide and atorvastatin due to complains of diarrhea which resolved when he stopped them and has been compliant with Lantus 35 units twice daily.  His diabetic neuropathy is controlled on his current dose of gabapentin.  He has an upcoming  eye appointment with [place] retina specialist next month. With regards to his blood pressure he informs me he has been taking only carvedilol even though amlodipine and isosorbide are on his med list.  He was previously on lisinopril which was discontinued during his last hospitalization due to acute kidney injury. His anxiety and depression are managed by psychiatry at behavioral health and he reports control of his symptoms.  His right foot dorsal ulcer has healed , his last visit to orthopedics, Dr. Sharol Given on 09/2017  Past Medical History:  Diagnosis Date  . Anemia   . Bipolar disorder ([place])   . Cellulitis 05/2017   RIGHT FOOT   DIABETIC ULCER   . Cellulitis and abscess of left leg 06/2016  . Chest pain    a. 2015 Reportedly normal stress test in [place].  Marland Kitchen Chronic diastolic CHF (congestive heart failure) (HCC)    a.03/2015 Echo: EF 55-60%, Gr 1 DD, mild MR, triv PR.  . Depression   . Depression with anxiety   . Dyspnea    with exertion  . GERD (gastroesophageal reflux disease)   . Glaucoma   . Hyperlipidemia   . Hypertension    a. 08/2014 Admitted with hypertensive urgency.  . Insomnia   . Internal carotid artery stenosis   . Lower GI bleed 07/04/2015  . Neuropathy   . Refusal of blood transfusions as patient is Jehov[employer]'s Witness   .  TIA (transient ischemic attack) 08/2014; 03/2015   a. 08/2014 in setting of hypertensive urgency.  . Type II diabetes mellitus ([place])    started insulin spring 2016, Type II  . Vitamin D deficiency spring 2016    Past Surgical History:  Procedure Laterality Date  . AMPUTATION Left 08/03/2015   Procedure: AMPUTATION LEFT GREAT TOE;  Surgeon: Newt Minion, MD;  Location: [place];  Service: Orthopedics;  Laterality: Left;  . AMPUTATION Left 12/02/2015   Procedure: amputation of left 2nd digit  . AMPUTATION Left 04/10/2016   Procedure: Left 2nd Toe Amputation at MTP Joint;  Surgeon: Newt Minion, MD;  Location: [place];  Service: Orthopedics;  Laterality: Left;  . AMPUTATION Left 06/09/2016   Procedure: AMPUTATION LEFT THIRD TOE;  Surgeon: [name], MD;  Location: [place];  Service: Orthopedics;  Laterality: Left;  . AMPUTATION Left 06/26/2016   Procedure: Left Foot Transmetatarsal Amputation;  Surgeon: Newt Minion, MD;  Location: [place];  Service: Orthopedics;  Laterality: Left;  . APPLICATION OF WOUND VAC Left 12/19/2015   Procedure: APPLICATION OF WOUND VAC;  Surgeon: Meredith Pel, MD;  Location: [place];  Service: Orthopedics;  Laterality: Left;  . CIRCUMCISION    . COLONOSCOPY N/A 01/22/2016   Procedure: COLONOSCOPY;  Surgeon: Gatha Mayer, MD;  Location: [place];  Service: Endoscopy;  Laterality: N/A;  .  ESOPHAGOGASTRODUODENOSCOPY N/A 01/19/2016   Procedure: ESOPHAGOGASTRODUODENOSCOPY (EGD);  Surgeon: Manus Gunning, MD;  Location: [place];  Service: Gastroenterology;  Laterality: N/A;  . ESOPHAGOGASTRODUODENOSCOPY N/A 01/22/2016   Procedure: ESOPHAGOGASTRODUODENOSCOPY (EGD);  Surgeon: Gatha Mayer, MD;  [facility] ENDOSCOPY;  Service: Endoscopy;  Laterality: N/A;  . I&D EXTREMITY Left 12/02/2015   Procedure: IRRIGATION AND DEBRIDEMENT OF FOOT; LEFT SECOND TOE AMPUTATION;  Surgeon: Meredith Pel, MD;  Location: [place];  Service: Orthopedics;  Laterality: Left;  . I&D  EXTREMITY Left 12/19/2015   Procedure: I & D LEFT FOOT WITH BEADS ;  Surgeon: Meredith Pel, MD;  Location: [place];  Service: Orthopedics;  Laterality: Left;  . I&D EXTREMITY Right 01/17/2016   Procedure: IRRIGATION AND DEBRIDEMENT RIGHT FOOT;  Surgeon: Newt Minion, MD;  Location: [place];  Service: Orthopedics;  Laterality: Right;  . INCISION AND DRAINAGE FOOT Right 01/17/2016  . INGUINAL HERNIA REPAIR Bilateral ~ 1983- ~ 1986  . TONSILLECTOMY  ~ 1985    Allergies  Allergen Reactions  . Lactose Intolerance (Gi) Diarrhea and Other (See Comments)    Bloating (also)  . Other Other (See Comments)    Red meat causes stomach pains, bloating and diarrhea  . Milk-Related Compounds Diarrhea and Other (See Comments)    Any dairy products  - diarrhea and bloating     Outpatient Medications Prior to Visit  Medication Sig Dispense Refill  . acetaminophen (TYLENOL) 325 MG tablet Take 2 tablets (650 mg total) by mouth every 6 (six) hours as needed for mild pain, moderate pain or fever (or Fever >/= 101).    Marland Kitchen amLODipine (NORVASC) 10 MG tablet Take 1 tablet (10 mg total) by mouth daily. 30 tablet 11  . aspirin EC 81 MG tablet Take 1 tablet (81 mg total) by mouth daily. 30 tablet 11  . atorvastatin (LIPITOR) 80 MG tablet Take 1 tablet (80 mg total) by mouth every morning. (Patient taking differently: Take 80 mg by mouth daily. ) 30 tablet 5  . Blood Glucose Monitoring Suppl (TRUE METRIX METER) DEVI 1 each by Does not apply route 3 (three) times daily. 1 Device 0  . carbamazepine (TEGRETOL) 200 MG tablet Take 1 tablet (200 mg total) by mouth at bedtime. 90 tablet 0  . Elastic Bandages & Supports (T.E.D. KNEE LENGTH/S-REGULAR) MISC Wear when awake 1 each 0  . FLUoxetine (PROZAC) 40 MG capsule Take 1 capsule (40 mg total) by mouth daily. 90 capsule 1  . folic acid (FOLVITE) 1 MG tablet Take 1 tablet (1 mg total) by mouth daily. 30 tablet 3  . glucose blood (ACCU-CHEK AVIVA PLUS) test strip Use as  instructed 100 each 12  . glucose blood (TRUE METRIX BLOOD GLUCOSE TEST) test strip 1 each by Other route 3 (three) times daily. 100 each 12  . isosorbide mononitrate (IMDUR) 30 MG 24 hr tablet Take 1 tablet (30 mg total) by mouth daily. 30 tablet 3  . nitroGLYCERIN (NITRODUR - DOSED IN MG/24 HR) 0.2 mg/hr patch Place 1 patch (0.2 mg total) onto the skin daily. 30 patch 12  . pantoprazole (PROTONIX) 40 MG tablet Take 1 tablet (40 mg total) by mouth 2 (two) times daily before a meal. 60 tablet 3  . pentoxifylline (TRENTAL) 400 MG CR tablet Take 1 tablet (400 mg total) by mouth 3 (three) times daily with meals. 90 tablet 0  . pentoxifylline (TRENTAL) 400 MG CR tablet Take 1 tablet (400 mg total) by mouth 3 (three)  times daily with meals. 90 tablet 3  . polyethylene glycol (MIRALAX / GLYCOLAX) packet Take 17 g by mouth daily as needed for moderate constipation. (Patient taking differently: Take 17 g by mouth daily as needed for moderate constipation. Mix in 8 oz liquid and drink) 14 each 0  . senna-docusate (SENOKOT-S) 8.6-50 MG tablet Take 1 tablet by mouth at bedtime. 60 tablet 2  . silver sulfADIAZINE (SILVADENE) 1 % cream Apply topically 2 (two) times daily. Apply to wound on top of your right foot. Cover with dry dressing. 50 g 0  . traZODone (DESYREL) 100 MG tablet Take 2 tablets (200 mg total) by mouth at bedtime. 180 tablet 1  . Vitamin D, Ergocalciferol, (DRISDOL) 50000 units CAPS capsule Take 1 capsule (50,000 Units total) by mouth See admin instructions. Take one capsule (50000 units) by mouth on the 1st Sunday of each month    . carvedilol (COREG) 25 MG tablet TAKE 1 TABLET BY MOUTH 2 TIMES DAILY WITH A MEAL (Patient taking differently: Take 25 mg by mouth 2 (two) times daily with a meal. ) 60 tablet 5  . doxycycline (VIBRA-TABS) 100 MG tablet Take 1 tablet (100 mg total) by mouth 2 (two) times daily. 20 tablet 0  . fluticasone (FLONASE) 50 MCG/ACT nasal spray Place 2 sprays into both nostrils  daily as needed for allergies or rhinitis.    Marland Kitchen gabapentin (NEURONTIN) 300 MG capsule Take 1 capsule (300 mg total) by mouth 2 (two) times daily. 60 capsule 5  . glipiZIDE (GLUCOTROL XL) 10 MG 24 hr tablet Take 1 tablet (10 mg total) by mouth daily with breakfast.    . Insulin Glargine (LANTUS SOLOSTAR) 100 UNIT/ML Solostar Pen Inject 30 Units into the skin 2 (two) times daily. 30 mL 3   No facility-administered medications prior to visit.     ROS Review of Systems  Constitutional: Negative for activity change and appetite change.  HENT: Negative for sinus pressure and sore throat.   Eyes: Negative for visual disturbance.  Respiratory: Negative for cough, chest tightness and shortness of breath.   Cardiovascular: Negative for chest pain and leg swelling.  Gastrointestinal: Negative for abdominal distention, abdominal pain, constipation and diarrhea.  Endocrine: Negative.   Genitourinary: Negative for dysuria.  Musculoskeletal: Negative for joint swelling and myalgias.  Skin: Negative for rash.  Allergic/Immunologic: Negative.   Neurological: Negative for weakness, light-headedness and numbness.  Psychiatric/Behavioral: Negative for dysphoric mood and suicidal ideas.    Objective:  BP (!) 146/90   Pulse 79   Temp (!) 97.4 F (36.3 C) (Oral)   Ht 5' 7"  (1.702 m)   Wt 202 lb 9.6 oz (91.9 kg)   SpO2 94%   BMI 31.73 kg/m   BP/Weight 11/20/2017 09/23/8117 06/21/7827  Systolic BP 562 - 130  Diastolic BP 90 - 865  Wt. (Lbs) 202.6 204 204  BMI 31.73 31.95 31.95  Some encounter information is confidential and restricted. Go to Review Flowsheets activity to see all data.      Physical Exam  Constitutional: He is oriented to person, place, and time. He appears well-developed and well-nourished.  Cardiovascular: Normal rate, normal heart sounds and intact distal pulses.  No murmur heard. Pulmonary/Chest: Effort normal and breath sounds normal. He has no wheezes. He has no rales. He  exhibits no tenderness.  Abdominal: Soft. Bowel sounds are normal. He exhibits no distension and no mass. There is no tenderness.  Musculoskeletal: Normal range of motion.  Left foot transmetatarsal amputation  Neurological: He is alert and oriented to person, place, and time.  Skin: Skin is warm.  Psychiatric: He has a normal mood and affect.     CMP Latest Ref Rng & Units 08/28/2017 08/22/2017 08/11/2017  Glucose 65 - 99 mg/dL 155(H) 202(H) 178(H)  BUN 6 - 24 mg/dL 21 29(H) 16  Creatinine 0.76 - 1.27 mg/dL 1.66(H) 1.94(H) 1.59(H)  Sodium 134 - 144 mmol/L 146(H) 141 141  Potassium 3.5 - 5.2 mmol/L 4.9 5.1 4.2  Chloride 96 - 106 mmol/L 110(H) 107(H) 112(H)  CO2 20 - 29 mmol/L 22 22 24   Calcium 8.7 - 10.2 mg/dL 9.4 8.9 8.4(L)  Total Protein 6.0 - 8.5 g/dL 6.8 7.0 -  Total Bilirubin 0.0 - 1.2 mg/dL <0.2 <0.2 -  Alkaline Phos 39 - 117 IU/L 82 86 -  AST 0 - 40 IU/L 27 23 -  ALT 0 - 44 IU/L 47(H) 44 -    Lipid Panel     Component Value Date/Time   CHOL 137 05/12/2017 1003   TRIG 86 05/12/2017 1003   HDL 41 05/12/2017 1003   CHOLHDL 3.3 05/12/2017 1003   CHOLHDL 3.3 03/09/2016 0127   VLDL 16 03/09/2016 0127   LDLCALC 79 05/12/2017 1003    Lab Results  Component Value Date   HGBA1C 9.4 (A) 11/20/2017      Assessment & Plan:   1. Diabetes mellitus type 2, uncontrolled, with complications ([place]) Uncontrolled with A1c of 9.4 He has not been taking glipizide due to diarrhea Increased dose of Lantus He needs to be on a statin and has been advised to try taking his statin at least twice a week due to his complaints about diarrhea rather than discontinuing it altogether as he is at high risk of cardiovascular events. If he has intolerance and will continue switching to another form of statin which might be less potent but better than not taking anything at all. Counseled on Diabetic diet, my plate method, 413 minutes of moderate intensity exercise/week Keep blood sugar logs with  fasting goals of 80-120 mg/dl, random of less than 180 and in the event of sugars less than 60 mg/dl or greater than 400 mg/dl please notify the clinic ASAP. It is recommended that you undergo annual eye exams and annual foot exams. Pneumonia vaccine is recommended. - POCT glucose (manual entry) - POCT glycosylated hemoglobin (Hb A1C) - CMP14+EGFR - Microalbumin/Creatinine Ratio, Urine - Insulin Glargine (LANTUS SOLOSTAR) 100 UNIT/ML Solostar Pen; Inject 35 Units into the skin 2 (two) times daily.  Dispense: 30 mL; Refill: 6  2. Essential hypertension Slightly elevated His med list reveals he should be taking isosorbide as well as amlodipine which he is unsure if he has been taking He has been advised to call the clinic back with that information If his blood pressure remains elevated we will add on low-dose lisinopril which he previously took but was discontinued due to abnormal renal function - carvedilol (COREG) 25 MG tablet; Take 1 tablet (25 mg total) by mouth 2 (two) times daily with a meal.  Dispense: 60 tablet; Refill: 6  3. Uncontrolled type 2 diabetes mellitus with complication, with long-term current use of insulin (HCC) - glipiZIDE (GLUCOTROL XL) 10 MG 24 hr tablet; Take 1 tablet (10 mg total) by mouth daily with breakfast.  Dispense: 30 tablet; Refill: 6  4. Diabetic polyneuropathy associated with diabetes mellitus due to underlying condition (HCC) Controlled stable Followed by - gabapentin (NEURONTIN) 300 MG capsule; Take 1 capsule (300 mg  total) by mouth 2 (two) times daily.  Dispense: 60 capsule; Refill: 6  5. Depression with anxiety Controlled Continue trazodone, furosemide, Tegretol as per mental health Keep appointment with psychiatry  6. S/P transmetatarsal amputation of foot, left (HCC) Stable Risk factor modification   Meds ordered this encounter  Medications  . Insulin Glargine (LANTUS SOLOSTAR) 100 UNIT/ML Solostar Pen    Sig: Inject 35 Units into the skin  2 (two) times daily.    Dispense:  30 mL    Refill:  6    Discontinue previous dose  . glipiZIDE (GLUCOTROL XL) 10 MG 24 hr tablet    Sig: Take 1 tablet (10 mg total) by mouth daily with breakfast.    Dispense:  30 tablet    Refill:  6  . gabapentin (NEURONTIN) 300 MG capsule    Sig: Take 1 capsule (300 mg total) by mouth 2 (two) times daily.    Dispense:  60 capsule    Refill:  6    Discontinue previous dose  . carvedilol (COREG) 25 MG tablet    Sig: Take 1 tablet (25 mg total) by mouth 2 (two) times daily with a meal.    Dispense:  60 tablet    Refill:  6    Follow-up: Return in about 3 months (around 02/20/2018) for follow up of chronic medical conditions.   Charlott Rakes MD

## 2017-11-20 NOTE — Progress Notes (Signed)
Patient states that atorvastatin and glipizide give him diarrhea.

## 2017-11-21 ENCOUNTER — Other Ambulatory Visit: Payer: Self-pay | Admitting: Family Medicine

## 2017-11-21 LAB — CMP14+EGFR
A/G RATIO: 1.5 (ref 1.2–2.2)
ALBUMIN: 3.8 g/dL (ref 3.5–5.5)
ALK PHOS: 63 IU/L (ref 39–117)
ALT: 17 IU/L (ref 0–44)
AST: 15 IU/L (ref 0–40)
BUN / CREAT RATIO: 17 (ref 9–20)
BUN: 25 mg/dL — ABNORMAL HIGH (ref 6–24)
Bilirubin Total: <0.2 mg/dL (ref 0.0–1.2)
CALCIUM: 9.2 mg/dL (ref 8.7–10.2)
CO2: 26 mmol/L (ref 20–29)
Chloride: 104 mmol/L (ref 96–106)
Creatinine, Ser: 1.51 mg/dL — ABNORMAL HIGH (ref 0.76–1.27)
GFR calc Af Amer: 62 mL/min/{1.73_m2} (ref >59)
GFR, EST NON AFRICAN AMERICAN: 54 mL/min/{1.73_m2} — AB (ref >59)
Globulin, Total: 2.6 g/dL (ref 1.5–4.5)
Glucose: 107 mg/dL — ABNORMAL HIGH (ref 65–99)
POTASSIUM: 4.6 mmol/L (ref 3.5–5.2)
SODIUM: 141 mmol/L (ref 134–144)
Total Protein: 6.4 g/dL (ref 6.0–8.5)

## 2017-11-21 LAB — MICROALBUMIN / CREATININE URINE RATIO
Creatinine, Urine: 224.1 mg/dL
MICROALBUM., U, RANDOM: 1233.3 ug/mL
Microalb/Creat Ratio: 550.3 mg/g creat — ABNORMAL HIGH (ref 0.0–30.0)

## 2017-11-21 MED ORDER — LISINOPRIL 5 MG PO TABS: 5.0000 mg | ORAL_TABLET | Freq: Every day | ORAL | 3 refills | Status: DC

## 2017-11-24 ENCOUNTER — Other Ambulatory Visit: Payer: Self-pay | Admitting: Physician Assistant

## 2017-11-24 MED FILL — ?CARBAMAZEPINE 200MG TAB: 200 | 30 days supply | Qty: 30 | Fill #2

## 2017-11-24 MED FILL — SUCRALFATE 1 GM TABLET: 1 | 30 days supply | Qty: 120 | Fill #0

## 2017-11-24 MED FILL — PANTOPRAZOLE SOD DR 40 MG T: 40 | 30 days supply | Qty: 30 | Fill #2

## 2017-11-24 MED FILL — traZODone HCL 100 MG TABS: 100 | 30 days supply | Qty: 60 | Fill #2

## 2017-11-24 MED FILL — FLUoxetine HCL 40 MG CAPS: 40 | 30 days supply | Qty: 30 | Fill #2

## 2017-12-01 ENCOUNTER — Encounter: Payer: Self-pay | Admitting: Gastroenterology

## 2017-12-16 ENCOUNTER — Ambulatory Visit (HOSPITAL_COMMUNITY): Payer: Self-pay | Admitting: Psychiatry

## 2017-12-19 ENCOUNTER — Ambulatory Visit: Payer: Self-pay | Admitting: Gastroenterology

## 2017-12-25 ENCOUNTER — Ambulatory Visit (INDEPENDENT_AMBULATORY_CARE_PROVIDER_SITE_OTHER): Payer: Self-pay | Admitting: Orthopedic Surgery

## 2017-12-29 MED FILL — CARVEDILOL 25 MG TABLET: 25 | 30 days supply | Qty: 60 | Fill #1

## 2017-12-29 MED FILL — FLUoxetine HCL 40 MG CAPS: 40 | 30 days supply | Qty: 30 | Fill #3

## 2017-12-30 ENCOUNTER — Other Ambulatory Visit: Payer: Self-pay

## 2017-12-30 MED ORDER — PANTOPRAZOLE SODIUM 40 MG PO TBEC: 40.0000 mg | DELAYED_RELEASE_TABLET | Freq: Every day | ORAL | 3 refills | Status: DC

## 2017-12-30 MED FILL — ?PANTOPRAZOLE SO DR 40MG TA: 40 | 30 days supply | Qty: 30 | Fill #0

## 2017-12-30 MED FILL — TRUE METRIX TEST STRIP: 30 days supply | Qty: 100 | Fill #7

## 2018-01-01 ENCOUNTER — Other Ambulatory Visit (HOSPITAL_COMMUNITY): Payer: Self-pay | Admitting: Psychiatry

## 2018-01-01 DIAGNOSIS — F418 Other specified anxiety disorders: Secondary | ICD-10-CM

## 2018-01-01 DIAGNOSIS — F5104 Psychophysiologic insomnia: Secondary | ICD-10-CM

## 2018-01-01 MED FILL — ?CARBAMAZEPINE 200MG TAB: 200 | 30 days supply | Qty: 30 | Fill #0

## 2018-01-06 ENCOUNTER — Telehealth: Payer: Self-pay | Admitting: Family Medicine

## 2018-01-10 ENCOUNTER — Encounter (HOSPITAL_COMMUNITY): Payer: Self-pay | Admitting: *Deleted

## 2018-01-10 ENCOUNTER — Other Ambulatory Visit: Payer: Self-pay

## 2018-01-10 ENCOUNTER — Emergency Department (HOSPITAL_COMMUNITY): Payer: Medicaid Other

## 2018-01-10 ENCOUNTER — Inpatient Hospital Stay (HOSPITAL_COMMUNITY)
Admission: EM | Admit: 2018-01-10 | Discharge: 2018-01-13 | DRG: 638 | Disposition: A | Discharge status: Home / Self Care | Payer: Medicaid Other | Attending: Family Medicine | Admitting: Family Medicine

## 2018-01-10 DIAGNOSIS — I130 Hypertensive heart and chronic kidney disease with heart failure and stage 1 through stage 4 chronic kidney disease, or unspecified chronic kidney disease: Secondary | ICD-10-CM | POA: Diagnosis present

## 2018-01-10 DIAGNOSIS — Z8673 Personal history of transient ischemic attack (TIA), and cerebral infarction without residual deficits: Secondary | ICD-10-CM | POA: Diagnosis not present

## 2018-01-10 DIAGNOSIS — R809 Proteinuria, unspecified: Secondary | ICD-10-CM | POA: Diagnosis not present

## 2018-01-10 DIAGNOSIS — N183 Chronic kidney disease, stage 3 (moderate): Secondary | ICD-10-CM | POA: Diagnosis present

## 2018-01-10 DIAGNOSIS — E1143 Type 2 diabetes mellitus with diabetic autonomic (poly)neuropathy: Secondary | ICD-10-CM | POA: Diagnosis present

## 2018-01-10 DIAGNOSIS — E739 Lactose intolerance, unspecified: Secondary | ICD-10-CM | POA: Diagnosis present

## 2018-01-10 DIAGNOSIS — I739 Peripheral vascular disease, unspecified: Secondary | ICD-10-CM

## 2018-01-10 DIAGNOSIS — E1165 Type 2 diabetes mellitus with hyperglycemia: Secondary | ICD-10-CM | POA: Diagnosis present

## 2018-01-10 DIAGNOSIS — F319 Bipolar disorder, unspecified: Secondary | ICD-10-CM | POA: Diagnosis present

## 2018-01-10 DIAGNOSIS — N529 Male erectile dysfunction, unspecified: Secondary | ICD-10-CM | POA: Diagnosis present

## 2018-01-10 DIAGNOSIS — Z794 Long term (current) use of insulin: Secondary | ICD-10-CM | POA: Diagnosis not present

## 2018-01-10 DIAGNOSIS — R339 Retention of urine, unspecified: Secondary | ICD-10-CM | POA: Diagnosis present

## 2018-01-10 DIAGNOSIS — F329 Major depressive disorder, single episode, unspecified: Secondary | ICD-10-CM

## 2018-01-10 DIAGNOSIS — Z7982 Long term (current) use of aspirin: Secondary | ICD-10-CM

## 2018-01-10 DIAGNOSIS — G4700 Insomnia, unspecified: Secondary | ICD-10-CM | POA: Diagnosis present

## 2018-01-10 DIAGNOSIS — Z8249 Family history of ischemic heart disease and other diseases of the circulatory system: Secondary | ICD-10-CM

## 2018-01-10 DIAGNOSIS — E785 Hyperlipidemia, unspecified: Secondary | ICD-10-CM | POA: Diagnosis present

## 2018-01-10 DIAGNOSIS — L97411 Non-pressure chronic ulcer of right heel and midfoot limited to breakdown of skin: Secondary | ICD-10-CM

## 2018-01-10 DIAGNOSIS — F5104 Psychophysiologic insomnia: Secondary | ICD-10-CM

## 2018-01-10 DIAGNOSIS — I5032 Chronic diastolic (congestive) heart failure: Secondary | ICD-10-CM | POA: Diagnosis present

## 2018-01-10 DIAGNOSIS — L0390 Cellulitis, unspecified: Secondary | ICD-10-CM | POA: Diagnosis present

## 2018-01-10 DIAGNOSIS — F418 Other specified anxiety disorders: Secondary | ICD-10-CM | POA: Diagnosis present

## 2018-01-10 DIAGNOSIS — E1122 Type 2 diabetes mellitus with diabetic chronic kidney disease: Secondary | ICD-10-CM | POA: Diagnosis present

## 2018-01-10 DIAGNOSIS — F32A Depression, unspecified: Secondary | ICD-10-CM

## 2018-01-10 DIAGNOSIS — L03115 Cellulitis of right lower limb: Secondary | ICD-10-CM | POA: Diagnosis present

## 2018-01-10 DIAGNOSIS — K3184 Gastroparesis: Secondary | ICD-10-CM | POA: Diagnosis present

## 2018-01-10 DIAGNOSIS — E119 Type 2 diabetes mellitus without complications: Secondary | ICD-10-CM

## 2018-01-10 DIAGNOSIS — Z833 Family history of diabetes mellitus: Secondary | ICD-10-CM

## 2018-01-10 DIAGNOSIS — E11621 Type 2 diabetes mellitus with foot ulcer: Principal | ICD-10-CM | POA: Diagnosis present

## 2018-01-10 DIAGNOSIS — L97519 Non-pressure chronic ulcer of other part of right foot with unspecified severity: Secondary | ICD-10-CM | POA: Diagnosis present

## 2018-01-10 DIAGNOSIS — Z89432 Acquired absence of left foot: Secondary | ICD-10-CM | POA: Diagnosis not present

## 2018-01-10 DIAGNOSIS — K219 Gastro-esophageal reflux disease without esophagitis: Secondary | ICD-10-CM | POA: Diagnosis present

## 2018-01-10 DIAGNOSIS — Z9114 Patient's other noncompliance with medication regimen: Secondary | ICD-10-CM

## 2018-01-10 DIAGNOSIS — Z79899 Other long term (current) drug therapy: Secondary | ICD-10-CM | POA: Diagnosis not present

## 2018-01-10 DIAGNOSIS — K5900 Constipation, unspecified: Secondary | ICD-10-CM | POA: Diagnosis present

## 2018-01-10 DIAGNOSIS — Z91018 Allergy to other foods: Secondary | ICD-10-CM

## 2018-01-10 LAB — CBC WITH DIFFERENTIAL/PLATELET
Abs Immature Granulocytes: 0 10*3/uL (ref 0.0–0.1)
BASOS PCT: 0 %
Basophils Absolute: 0 10*3/uL (ref 0.0–0.1)
Eosinophils Absolute: 0.1 10*3/uL (ref 0.0–0.7)
Eosinophils Relative: 1 %
HEMATOCRIT: 35.7 % — AB (ref 39.0–52.0)
Hemoglobin: 11 g/dL — ABNORMAL LOW (ref 13.0–17.0)
Immature Granulocytes: 0 %
LYMPHS ABS: 1.4 10*3/uL (ref 0.7–4.0)
Lymphocytes Relative: 14 %
MCH: 29.7 pg (ref 26.0–34.0)
MCHC: 30.8 g/dL (ref 30.0–36.0)
MCV: 96.5 fL (ref 78.0–100.0)
MONO ABS: 0.5 10*3/uL (ref 0.1–1.0)
MONOS PCT: 5 %
NEUTROS ABS: 7.7 10*3/uL (ref 1.7–7.7)
Neutrophils Relative %: 80 %
PLATELETS: 210 10*3/uL (ref 150–400)
RBC: 3.7 MIL/uL — ABNORMAL LOW (ref 4.22–5.81)
RDW: 12.9 % (ref 11.5–15.5)
WBC: 9.7 10*3/uL (ref 4.0–10.5)

## 2018-01-10 LAB — COMPREHENSIVE METABOLIC PANEL
ALBUMIN: 3.1 g/dL — AB (ref 3.5–5.0)
ALT: 21 U/L (ref 0–44)
ANION GAP: 7 (ref 5–15)
AST: 20 U/L (ref 15–41)
Alkaline Phosphatase: 59 U/L (ref 38–126)
BUN: 20 mg/dL (ref 6–20)
CALCIUM: 8.7 mg/dL — AB (ref 8.9–10.3)
CHLORIDE: 106 mmol/L (ref 98–111)
CO2: 27 mmol/L (ref 22–32)
CREATININE: 1.4 mg/dL — AB (ref 0.61–1.24)
GFR calc Af Amer: >60 mL/min (ref >60)
GFR calc non Af Amer: 58 mL/min — ABNORMAL LOW (ref >60)
GLUCOSE: 256 mg/dL — AB (ref 70–99)
Potassium: 4.2 mmol/L (ref 3.5–5.1)
SODIUM: 140 mmol/L (ref 135–145)
Total Bilirubin: 0.3 mg/dL (ref 0.3–1.2)
Total Protein: 6.3 g/dL — ABNORMAL LOW (ref 6.5–8.1)

## 2018-01-10 LAB — URINALYSIS, ROUTINE W REFLEX MICROSCOPIC
BACTERIA UA: NONE SEEN
BILIRUBIN URINE: NEGATIVE
Glucose, UA: >=500 mg/dL — AB
HGB URINE DIPSTICK: NEGATIVE
KETONES UR: NEGATIVE mg/dL
LEUKOCYTES UA: NEGATIVE
Nitrite: NEGATIVE
SPECIFIC GRAVITY, URINE: 1.027 (ref 1.005–1.030)
pH: 5 (ref 5.0–8.0)

## 2018-01-10 LAB — C-REACTIVE PROTEIN: CRP: 1.2 mg/dL — ABNORMAL HIGH (ref <1.0)

## 2018-01-10 LAB — I-STAT CG4 LACTIC ACID, ED: Lactic Acid, Venous: 1.52 mmol/L (ref 0.5–1.9)

## 2018-01-10 LAB — SEDIMENTATION RATE: Sed Rate: 45 mm/hr — ABNORMAL HIGH (ref 0–16)

## 2018-01-10 MED ORDER — NITROGLYCERIN 0.2 MG/HR TD PT24: 0.2000 mg | MEDICATED_PATCH | Freq: Every day | TRANSDERMAL | Status: DC

## 2018-01-10 MED ORDER — TRAZODONE HCL 100 MG PO TABS
100.0000 mg | ORAL_TABLET | Freq: Every day | ORAL | Status: DC
  Administered 2018-01-10 – 2018-01-12 (×3): 100 mg via ORAL
  Filled 2018-01-10 (×3): qty 1

## 2018-01-10 MED ORDER — SODIUM CHLORIDE 0.9 % IV BOLUS
1000.0000 mL | Freq: Once | INTRAVENOUS | Status: AC
  Administered 2018-01-10: 1000 mL via INTRAVENOUS

## 2018-01-10 MED ORDER — CLINDAMYCIN PHOSPHATE 600 MG/50ML IV SOLN
600.0000 mg | Freq: Once | INTRAVENOUS | Status: AC
  Administered 2018-01-10: 600 mg via INTRAVENOUS
  Filled 2018-01-10: qty 50

## 2018-01-10 MED ORDER — FLUOXETINE HCL 20 MG PO CAPS
40.0000 mg | ORAL_CAPSULE | Freq: Every day | ORAL | Status: DC
  Administered 2018-01-11 – 2018-01-13 (×3): 40 mg via ORAL
  Filled 2018-01-10 (×4): qty 2

## 2018-01-10 MED ORDER — SILVER SULFADIAZINE 1 % EX CREA
TOPICAL_CREAM | Freq: Two times a day (BID) | CUTANEOUS | Status: DC
  Administered 2018-01-11: 10:00:00 via TOPICAL
  Filled 2018-01-10: qty 85

## 2018-01-10 MED ORDER — FOLIC ACID 1 MG PO TABS
1.0000 mg | ORAL_TABLET | Freq: Every day | ORAL | Status: DC
  Administered 2018-01-11 – 2018-01-13 (×3): 1 mg via ORAL
  Filled 2018-01-10 (×3): qty 1

## 2018-01-10 MED ORDER — VANCOMYCIN HCL IN DEXTROSE 1-5 GM/200ML-% IV SOLN
1000.0000 mg | Freq: Two times a day (BID) | INTRAVENOUS | Status: DC
  Administered 2018-01-10 – 2018-01-12 (×5): 1000 mg via INTRAVENOUS
  Filled 2018-01-10 (×5): qty 200

## 2018-01-10 MED ORDER — POLYETHYLENE GLYCOL 3350 17 G PO PACK: 17.0000 g | PACK | Freq: Every day | ORAL | Status: DC | PRN

## 2018-01-10 MED ORDER — ACETAMINOPHEN 325 MG PO TABS
650.0000 mg | ORAL_TABLET | Freq: Four times a day (QID) | ORAL | Status: DC | PRN
  Administered 2018-01-11 – 2018-01-13 (×4): 650 mg via ORAL
  Filled 2018-01-10 (×4): qty 2

## 2018-01-10 MED ORDER — PANTOPRAZOLE SODIUM 40 MG PO TBEC
40.0000 mg | DELAYED_RELEASE_TABLET | Freq: Every day | ORAL | Status: DC
  Administered 2018-01-11 – 2018-01-13 (×3): 40 mg via ORAL
  Filled 2018-01-10 (×3): qty 1

## 2018-01-10 MED ORDER — INSULIN ASPART 100 UNIT/ML ~~LOC~~ SOLN
0.0000 [IU] | Freq: Three times a day (TID) | SUBCUTANEOUS | Status: DC
  Administered 2018-01-11 – 2018-01-12 (×4): 3 [IU] via SUBCUTANEOUS
  Administered 2018-01-12: 2 [IU] via SUBCUTANEOUS
  Administered 2018-01-12 – 2018-01-13 (×2): 3 [IU] via SUBCUTANEOUS
  Administered 2018-01-13: 5 [IU] via SUBCUTANEOUS

## 2018-01-10 MED ORDER — CARVEDILOL 25 MG PO TABS
25.0000 mg | ORAL_TABLET | Freq: Two times a day (BID) | ORAL | Status: DC
  Administered 2018-01-11 – 2018-01-13 (×5): 25 mg via ORAL
  Filled 2018-01-10 (×5): qty 1

## 2018-01-10 MED ORDER — PENTOXIFYLLINE ER 400 MG PO TBCR: 400.0000 mg | EXTENDED_RELEASE_TABLET | Freq: Three times a day (TID) | ORAL | Status: DC

## 2018-01-10 MED ORDER — GABAPENTIN 300 MG PO CAPS
300.0000 mg | ORAL_CAPSULE | Freq: Two times a day (BID) | ORAL | Status: DC
  Administered 2018-01-10 – 2018-01-13 (×6): 300 mg via ORAL
  Filled 2018-01-10 (×6): qty 1

## 2018-01-10 MED ORDER — SENNOSIDES-DOCUSATE SODIUM 8.6-50 MG PO TABS
1.0000 | ORAL_TABLET | Freq: Every day | ORAL | Status: DC
  Administered 2018-01-10: 1 via ORAL
  Filled 2018-01-10 (×3): qty 1

## 2018-01-10 MED ORDER — HYDRALAZINE HCL 20 MG/ML IJ SOLN
5.0000 mg | INTRAMUSCULAR | Status: DC | PRN
  Administered 2018-01-10 – 2018-01-11 (×2): 5 mg via INTRAVENOUS
  Filled 2018-01-10 (×2): qty 1

## 2018-01-10 MED ORDER — CARBAMAZEPINE 200 MG PO TABS
200.0000 mg | ORAL_TABLET | Freq: Every day | ORAL | Status: DC
  Administered 2018-01-10 – 2018-01-12 (×3): 200 mg via ORAL
  Filled 2018-01-10 (×3): qty 1

## 2018-01-10 MED ORDER — SUCRALFATE 1 G PO TABS
1.0000 g | ORAL_TABLET | Freq: Three times a day (TID) | ORAL | Status: DC
  Administered 2018-01-10 – 2018-01-13 (×11): 1 g via ORAL
  Filled 2018-01-10 (×11): qty 1

## 2018-01-10 NOTE — H&P (Signed)
[facility] Admission History and Physical Service Pager: [phone]  *incomplete note, will be finished tonight*  Patient name: Shane [name] Medical record number: [id number] Date of birth: 05-Apr-1971 Age: 47 y.o. Gender: male  Primary Care Provider: Charlott Rakes, MD Consultants:  Code Status: full  Chief Complaint: cellulitis  Assessment and Plan: Shane [name] is a 47 y.o. male presenting with cellulitis . PMH is significant for uncontrolled T2DM, prior partial left foot amputation, PAD, depression, insomnia, constipation, HTN  Cellulitis- ~3wks of increased right foot swelling/erythema in an uncontrolled diabetic with PAD.   Swelling significant enough to split skin ~3days ago. Has been prescribed trental but not taking it.  Hx of left foot partial amputation.  States given amoxicillin ~8 days ago with no improvement.  Concern for osteomyelitis given length of infection and past hx.  Distal pulse/sensation intact per patient.  XR did not show osteomyelitis.  Also from hx 07/28/17 pcp ordered MRI for ulcer in R foot that was never performed. -Admit to med-surg, Dr. Gwendlyn Deutscher Attending -ortho consult in AM -vanc per pharm -clinda per pharm -f/u blood cx -esr/crp, then potentially MRI -pulse checks -trental (prior prescribed by Sharol Given but patient not taking)   T2DM- uncontrolled with last two A1C >9.  Uses fixed dose lantus 40 and glipizide at home. Says he can't take metformin but doesn't know why.  Thinks he can manage a sliding scale insulin plan but we did not speak at length about it -mSSI -will need to evaluate tighter control outpatient  Depression- feeling lonely lately but no SI/HI, states he is safe -home prozac  Insomnia-states stable on long term meds, discussed risk of priapism with patient and he understands -home carbemazepine -home trazadone  Constipation -home senna/miralax  HTN/HFpEF -home coreg  Urinary complaints:  "takes to  long".  Denies pain/urgency/incomplete voids.  Just says it takes to long due to low flow -was offered a urology referal by pcp and says he never took it, wants to reconsider and see urology outpatient  FEN/GI: carb modified Prophylaxis: SCDs  Disposition: med-surg  History of Present Illness:  Shane [name] is a 47 y.o. male presenting with 3wk hx of swelling in his foot.  He lost his toenails about 2 wks ago.  Started on amoxacillin ~10days ago.  Pain started ~5 days ago and 3 days ago the top of his foot split and has been draining blood and sero-sanguinous fluid since.  1 day ago the medial aspect of big toe split.   He has no sensation changes in his distal toes.  He has been able to stand on it but it hurts so he doesn't like to do so.  He has had warmth on the right leg for the last three days and has been getting chilll symptoms for the last 3 days as well.   No SOB or chest pain.  No urinary changes (chronic low flow).  No bowel changes.  No palpitations, cough, or belly pain.  No new headaches, LOC, vertigo, or any other symptoms.  His DM is uncontrolled.  He takes lantus 40 daily with glipizide and no sliding scale.  He says he has never been offered one and thinks he could handle it.  He denies illicit substance use and says he takes all his meds as prescribed.  Review Of Systems: Per HPI with the following additions:   Review of Systems  Constitutional: Positive for chills and fever. Negative for weight loss.  HENT: Negative for congestion, hearing  loss and nosebleeds.   Eyes: Negative for blurred vision and pain.  Respiratory: Negative for shortness of breath and wheezing.   Cardiovascular: Negative for chest pain, palpitations and orthopnea.  Gastrointestinal: Positive for constipation. Negative for diarrhea and vomiting.  Genitourinary: Negative for dysuria.       Complains of low flow, says he was told he could go to urology but never has  Neurological: Negative for  dizziness, seizures, loss of consciousness and headaches.  Psychiatric/Behavioral: Positive for depression. Negative for hallucinations, substance abuse and suicidal ideas.    Patient Active Problem List   Diagnosis Date Noted  . Midfoot skin ulcer, right, limited to breakdown of skin ([place]) 08/14/2017  . Hypertension, uncontrolled 08/08/2017  . Cellulitis of right foot 08/07/2017  . Bulging lumbar disc 07/04/2017  . Weakness of right lower extremity 05/19/2017  . Cellulitis 05/18/2017  . Achilles tendon contracture, left 01/14/2017  . Gingivitis 11/14/2016  . Achilles tendon contracture, right 09/17/2016  . Pain and swelling of left lower leg 09/16/2016  . CKD (chronic kidney disease) stage 3, GFR 30-59 ml/min (HCC) 08/27/2016  . Dyslipidemia associated with type 2 diabetes mellitus ([place]) 07/22/2016  . Constipation 07/02/2016  . S/P transmetatarsal amputation of foot, left ([place]) 07/02/2016  . Dyspnea on exertion 07/02/2016  . Diabetic gastroparesis ([place]) 05/30/2016  . Buzzing in ear, right 03/26/2016  . Joint pain 03/11/2016  . Weakness 03/08/2016  . Dizziness 03/03/2016  . Chronic diastolic CHF (congestive heart failure), NYHA class 1 ([place]) 12/30/2015  . Diabetes mellitus type 2, uncontrolled, with complications ([place]) 93/81/8299  . Depression with anxiety 12/01/2015  . Pain in the chest 12/01/2015  . Insomnia 11/21/2015  . GERD (gastroesophageal reflux disease) 08/18/2015  . Erectile dysfunction 07/04/2015  . Symptomatic anemia 04/24/2015  . Left arm weakness 04/02/2015  . Sensory disturbance 04/02/2015  . Essential hypertension 04/02/2015  . Hemispheric carotid artery syndrome   . HLD (hyperlipidemia)   . Headache     Past Medical History: Past Medical History:  Diagnosis Date  . Anemia   . Bipolar disorder ([place])   . Cellulitis 05/2017   RIGHT FOOT   DIABETIC ULCER   . Cellulitis and abscess of left leg 06/2016  . Chest pain    a. 2015 Reportedly normal stress  test in [place].  Marland Kitchen Chronic diastolic CHF (congestive heart failure) (HCC)    a.03/2015 Echo: EF 55-60%, Gr 1 DD, mild MR, triv PR.  . Depression   . Depression with anxiety   . Dyspnea    with exertion  . GERD (gastroesophageal reflux disease)   . Glaucoma   . Hyperlipidemia   . Hypertension    a. 08/2014 Admitted with hypertensive urgency.  . Insomnia   . Internal carotid artery stenosis   . Lower GI bleed 07/04/2015  . Neuropathy   . Refusal of blood transfusions as patient is Jehovah's Witness   . TIA (transient ischemic attack) 08/2014; 03/2015   a. 08/2014 in setting of hypertensive urgency.  . Type II diabetes mellitus ([place])    started insulin spring 2016, Type II  . Vitamin D deficiency spring 2016    Past Surgical History: Past Surgical History:  Procedure Laterality Date  . AMPUTATION Left 08/03/2015   Procedure: AMPUTATION LEFT GREAT TOE;  Surgeon: Newt Minion, MD;  Location: [facility];  Service: Orthopedics;  Laterality: Left;  . AMPUTATION Left 12/02/2015   Procedure: amputation of left 2nd digit  . AMPUTATION Left 04/10/2016   Procedure: Left  2nd Toe Amputation at MTP Joint;  Surgeon: Newt Minion, MD;  Location: [facility];  Service: Orthopedics;  Laterality: Left;  . AMPUTATION Left 06/09/2016   Procedure: AMPUTATION LEFT THIRD TOE;  Surgeon: [name], MD;  Location: [facility];  Service: Orthopedics;  Laterality: Left;  . AMPUTATION Left 06/26/2016   Procedure: Left Foot Transmetatarsal Amputation;  Surgeon: Newt Minion, MD;  Location: [facility];  Service: Orthopedics;  Laterality: Left;  . APPLICATION OF WOUND VAC Left 12/19/2015   Procedure: APPLICATION OF WOUND VAC;  Surgeon: Meredith Pel, MD;  Location: [facility];  Service: Orthopedics;  Laterality: Left;  . CIRCUMCISION    . COLONOSCOPY N/A 01/22/2016   Procedure: COLONOSCOPY;  Surgeon: Gatha Mayer, MD;  Location: [facility];  Service: Endoscopy;  Laterality: N/A;  . ESOPHAGOGASTRODUODENOSCOPY N/A 01/19/2016   Procedure:  ESOPHAGOGASTRODUODENOSCOPY (EGD);  Surgeon: Manus Gunning, MD;  Location: [facility];  Service: Gastroenterology;  Laterality: N/A;  . ESOPHAGOGASTRODUODENOSCOPY N/A 01/22/2016   Procedure: ESOPHAGOGASTRODUODENOSCOPY (EGD);  Surgeon: Gatha Mayer, MD;  [facility] ENDOSCOPY;  Service: Endoscopy;  Laterality: N/A;  . I&D EXTREMITY Left 12/02/2015   Procedure: IRRIGATION AND DEBRIDEMENT OF FOOT; LEFT SECOND TOE AMPUTATION;  Surgeon: Meredith Pel, MD;  Location: [facility];  Service: Orthopedics;  Laterality: Left;  . I&D EXTREMITY Left 12/19/2015   Procedure: I & D LEFT FOOT WITH BEADS ;  Surgeon: Meredith Pel, MD;  Location: [facility];  Service: Orthopedics;  Laterality: Left;  . I&D EXTREMITY Right 01/17/2016   Procedure: IRRIGATION AND DEBRIDEMENT RIGHT FOOT;  Surgeon: Newt Minion, MD;  Location: [facility];  Service: Orthopedics;  Laterality: Right;  . INCISION AND DRAINAGE FOOT Right 01/17/2016  . INGUINAL HERNIA REPAIR Bilateral ~ 1983- ~ 1986  . TONSILLECTOMY  ~ 63    Social History: Social History   Tobacco Use  . Smoking status: Never Smoker  . Smokeless tobacco: Never Used  Substance Use Topics  . Alcohol use: No    Alcohol/week: 0.0 oz  . Drug use: No   Additional social history:   Please also refer to relevant sections of EMR.  Family History: Family History  Problem Relation Age of Onset  . Hypertension Mother   . Diabetes Mother   . Hyperlipidemia Mother   . Heart disease Mother        s/p pacemaker  . Diabetes Father   . Hypertension Father   . Stroke Father   . Heart attack Father        first MI @ 41.  . Depression Father   . Dementia Father   . Stroke Brother   . ADD / ADHD Brother   . Anxiety disorder Brother   . Bipolar disorder Brother   . OCD Brother   . Sexual abuse Brother      Allergies and Medications: Allergies  Allergen Reactions  . Lactose Intolerance (Gi) Diarrhea and Other (See Comments)    Bloating (also)  . Other Other  (See Comments)    Red meat causes stomach pains, bloating and diarrhea  . Milk-Related Compounds Diarrhea and Other (See Comments)    Any dairy products  - diarrhea and bloating   No current facility-administered medications on file prior to encounter.    Current Outpatient Medications on File Prior to Encounter  Medication Sig Dispense Refill  . acetaminophen (TYLENOL) 325 MG tablet Take 2 tablets (650 mg total) by mouth every 6 (six) hours as needed for  mild pain, moderate pain or fever (or Fever >/= 101).    Marland Kitchen aspirin EC 81 MG tablet Take 1 tablet (81 mg total) by mouth daily. 30 tablet 11  . carbamazepine (TEGRETOL) 200 MG tablet TAKE 1 TABLET BY MOUTH AT BEDTIME. 90 tablet 4  . carvedilol (COREG) 25 MG tablet Take 1 tablet (25 mg total) by mouth 2 (two) times daily with a meal. 60 tablet 6  . FLUoxetine (PROZAC) 40 MG capsule Take 1 capsule (40 mg total) by mouth daily. 90 capsule 1  . folic acid (FOLVITE) 1 MG tablet Take 1 tablet (1 mg total) by mouth daily. 30 tablet 3  . gabapentin (NEURONTIN) 300 MG capsule Take 1 capsule (300 mg total) by mouth 2 (two) times daily. 60 capsule 6  . glipiZIDE (GLUCOTROL XL) 10 MG 24 hr tablet Take 1 tablet (10 mg total) by mouth daily with breakfast. 30 tablet 6  . ibuprofen (ADVIL,MOTRIN) 200 MG tablet Take 800 mg by mouth as needed for moderate pain.    . Insulin Glargine (LANTUS SOLOSTAR) 100 UNIT/ML Solostar Pen Inject 35 Units into the skin 2 (two) times daily. (Patient taking differently: Inject 40 Units into the skin daily. ) 30 mL 6  . lisinopril (PRINIVIL,ZESTRIL) 5 MG tablet Take 1 tablet (5 mg total) by mouth daily. 30 tablet 3  . pantoprazole (PROTONIX) 40 MG tablet Take 1 tablet (40 mg total) by mouth daily. 30 tablet 3  . pentoxifylline (TRENTAL) 400 MG CR tablet Take 1 tablet (400 mg total) by mouth 3 (three) times daily with meals. 90 tablet 0  . polyethylene glycol (MIRALAX / GLYCOLAX) packet Take 17 g by mouth daily as needed for  moderate constipation. (Patient taking differently: Take 17 g by mouth daily as needed for moderate constipation. Mix in 8 oz liquid and drink) 14 each 0  . senna-docusate (SENOKOT-S) 8.6-50 MG tablet Take 1 tablet by mouth at bedtime. 60 tablet 2  . silver sulfADIAZINE (SILVADENE) 1 % cream Apply topically 2 (two) times daily. Apply to wound on top of your right foot. Cover with dry dressing. 50 g 0  . sucralfate (CARAFATE) 1 g tablet TAKE 1 TABLET (1 G TOTAL) BY MOUTH 4 (FOUR) TIMES DAILY - WITH MEALS AND AT BEDTIME. 120 tablet 0  . traZODone (DESYREL) 100 MG tablet Take 2 tablets (200 mg total) by mouth at bedtime. (Patient taking differently: Take 100 mg by mouth at bedtime. ) 180 tablet 1  . Vitamin D, Ergocalciferol, (DRISDOL) 50000 units CAPS capsule Take 1 capsule (50,000 Units total) by mouth See admin instructions. Take one capsule (50000 units) by mouth on the 1st Sunday of each month    . atorvastatin (LIPITOR) 80 MG tablet Take 1 tablet (80 mg total) by mouth every morning. (Patient not taking: Reported on 01/10/2018) 30 tablet 5  . Blood Glucose Monitoring Suppl (TRUE METRIX METER) DEVI 1 each by Does not apply route 3 (three) times daily. 1 Device 0  . Elastic Bandages & Supports (T.E.D. KNEE LENGTH/S-REGULAR) MISC Wear when awake 1 each 0  . glucose blood (ACCU-CHEK AVIVA PLUS) test strip Use as instructed 100 each 12  . glucose blood (TRUE METRIX BLOOD GLUCOSE TEST) test strip 1 each by Other route 3 (three) times daily. 100 each 12  . nitroGLYCERIN (NITRODUR - DOSED IN MG/24 HR) 0.2 mg/hr patch Place 1 patch (0.2 mg total) onto the skin daily. (Patient not taking: Reported on 01/10/2018) 30 patch 12  . pentoxifylline (TRENTAL)  400 MG CR tablet Take 1 tablet (400 mg total) by mouth 3 (three) times daily with meals. (Patient not taking: Reported on 01/10/2018) 90 tablet 3    Objective: BP (!) 230/119   Pulse 75   Temp 97.9 F (36.6 C) (Oral)   Resp 18   Ht 5' 7"  (1.702 m)   Wt 201  lb (91.2 kg)   SpO2 98%   BMI 31.48 kg/m  Exam: General: NAD, pleasant/conversational Eyes: clear sclera, no injections ENTM: clear nares, mmm Cardiovascular: RRR, no mumurs noted Respiratory: ctab, no IWB, no wheezes/cough Gastrointestinal: soft belly, obese, nopain MSK: left foot partial amputation Right foot with swelling and warm discoloration of foot, warmth and discoloration to mid shin.  There is a ~3inch split in skin on dorsal foot with exposed muscular tissue anda ~1inch split on medial great toethrough epidermis Neuro: grossly intact Psych: pleasant, AOx3 appropriate thought content  Labs and Imaging: CBC BMET  Recent Labs  Lab 01/10/18 1345  WBC 9.7  HGB 11.0*  HCT 35.7*  PLT 210   Recent Labs  Lab 01/10/18 1345  NA 140  K 4.2  CL 106  CO2 27  BUN 20  CREATININE 1.40*  GLUCOSE 256*  CALCIUM 8.7*     Dg Foot Complete Right  Result Date: 01/10/2018 CLINICAL DATA:  Left foot open wound for 1 week. EXAM: RIGHT FOOT COMPLETE - 3+ VIEW COMPARISON:  None. FINDINGS: There is no evidence of fracture or dislocation. There is no evidence of arthropathy or other focal bone abnormality. No osteolysis. IMPRESSION: No evidence of active osteitis. Electronically Signed   By: Ulyses Jarred M.D.   On: 01/10/2018 17:04    Sherene Sires, DO 01/10/2018, 8:29 PM PGY-2, [place] Intern pager: [phone], text pages welcome

## 2018-01-10 NOTE — ED Triage Notes (Signed)
Pt has open wound to Lt foot for one week . The dsy placed on Lt foot by Pt is bloody . Pt ambulatory to room with cane. Pt report CBG 136 this AM . Pt is IDDM.

## 2018-01-10 NOTE — Progress Notes (Signed)
Patient arrived via stretcher onto the unit in room 12. Alert and oriented x 4. Patient had belongings with him and walked from the stretcher to the bed without assistance using his own cane. No telemetry orders at this time. IV infusing at this time. Will continue to monitor.   Farley Ly RN

## 2018-01-10 NOTE — ED Provider Notes (Signed)
[facility] EMERGENCY DEPARTMENT Provider Note   CSN: [id number] Arrival date & time: 01/10/18  1311     History   Chief Complaint Chief Complaint  Patient presents with  . Foot Pain    HPI Shane [name] is a 47 y.o. male.  The history is provided by the patient. No language interpreter was used.  Foot Pain  This is a recurrent problem. The current episode started more than 1 week ago. The problem occurs constantly. The problem has been gradually worsening. Pertinent negatives include no chest pain and no headaches. Nothing aggravates the symptoms. Nothing relieves the symptoms. He has tried nothing for the symptoms. The treatment provided no relief.  Pt complains of infection to his right foot.  Pt reports he is on antibiotics.  Pt reports increased redness and drainage.  Pt has had an amputation of part of left foot second to infection.  He has been seen by Dr. Sharol Given in the past.   Past Medical History:  Diagnosis Date  . Anemia   . Bipolar disorder ([place])   . Cellulitis 05/2017   RIGHT FOOT   DIABETIC ULCER   . Cellulitis and abscess of left leg 06/2016  . Chest pain    a. 2015 Reportedly normal stress test in [place].  Marland Kitchen Chronic diastolic CHF (congestive heart failure) (HCC)    a.03/2015 Echo: EF 55-60%, Gr 1 DD, mild MR, triv PR.  . Depression   . Depression with anxiety   . Dyspnea    with exertion  . GERD (gastroesophageal reflux disease)   . Glaucoma   . Hyperlipidemia   . Hypertension    a. 08/2014 Admitted with hypertensive urgency.  . Insomnia   . Internal carotid artery stenosis   . Lower GI bleed 07/04/2015  . Neuropathy   . Refusal of blood transfusions as patient is Jehovah's Witness   . TIA (transient ischemic attack) 08/2014; 03/2015   a. 08/2014 in setting of hypertensive urgency.  . Type II diabetes mellitus ([place])    started insulin spring 2016, Type II  . Vitamin D deficiency spring 2016    Patient Active Problem List   Diagnosis Date  Noted  . Midfoot skin ulcer, right, limited to breakdown of skin ([place]) 08/14/2017  . Hypertension, uncontrolled 08/08/2017  . Cellulitis of right foot 08/07/2017  . Bulging lumbar disc 07/04/2017  . Weakness of right lower extremity 05/19/2017  . Cellulitis 05/18/2017  . Achilles tendon contracture, left 01/14/2017  . Gingivitis 11/14/2016  . Achilles tendon contracture, right 09/17/2016  . Pain and swelling of left lower leg 09/16/2016  . CKD (chronic kidney disease) stage 3, GFR 30-59 ml/min (HCC) 08/27/2016  . Dyslipidemia associated with type 2 diabetes mellitus ([place]) 07/22/2016  . Constipation 07/02/2016  . S/P transmetatarsal amputation of foot, left ([place]) 07/02/2016  . Dyspnea on exertion 07/02/2016  . Diabetic gastroparesis ([place]) 05/30/2016  . Buzzing in ear, right 03/26/2016  . Joint pain 03/11/2016  . Weakness 03/08/2016  . Dizziness 03/03/2016  . Chronic diastolic CHF (congestive heart failure), NYHA class 1 ([place]) 12/30/2015  . Diabetes mellitus type 2, uncontrolled, with complications ([place]) 53/66/4403  . Depression with anxiety 12/01/2015  . Pain in the chest 12/01/2015  . Insomnia 11/21/2015  . GERD (gastroesophageal reflux disease) 08/18/2015  . Erectile dysfunction 07/04/2015  . Symptomatic anemia 04/24/2015  . Left arm weakness 04/02/2015  . Sensory disturbance 04/02/2015  . Essential hypertension 04/02/2015  . Hemispheric carotid artery syndrome   .  HLD (hyperlipidemia)   . Headache     Past Surgical History:  Procedure Laterality Date  . AMPUTATION Left 08/03/2015   Procedure: AMPUTATION LEFT GREAT TOE;  Surgeon: Newt Minion, MD;  Location: [facility];  Service: Orthopedics;  Laterality: Left;  . AMPUTATION Left 12/02/2015   Procedure: amputation of left 2nd digit  . AMPUTATION Left 04/10/2016   Procedure: Left 2nd Toe Amputation at MTP Joint;  Surgeon: Newt Minion, MD;  Location: [facility];  Service: Orthopedics;  Laterality: Left;  . AMPUTATION Left  06/09/2016   Procedure: AMPUTATION LEFT THIRD TOE;  Surgeon: [name], MD;  Location: [facility];  Service: Orthopedics;  Laterality: Left;  . AMPUTATION Left 06/26/2016   Procedure: Left Foot Transmetatarsal Amputation;  Surgeon: Newt Minion, MD;  Location: [facility];  Service: Orthopedics;  Laterality: Left;  . APPLICATION OF WOUND VAC Left 12/19/2015   Procedure: APPLICATION OF WOUND VAC;  Surgeon: Meredith Pel, MD;  Location: [facility];  Service: Orthopedics;  Laterality: Left;  . CIRCUMCISION    . COLONOSCOPY N/A 01/22/2016   Procedure: COLONOSCOPY;  Surgeon: Gatha Mayer, MD;  Location: [facility];  Service: Endoscopy;  Laterality: N/A;  . ESOPHAGOGASTRODUODENOSCOPY N/A 01/19/2016   Procedure: ESOPHAGOGASTRODUODENOSCOPY (EGD);  Surgeon: Manus Gunning, MD;  Location: [facility];  Service: Gastroenterology;  Laterality: N/A;  . ESOPHAGOGASTRODUODENOSCOPY N/A 01/22/2016   Procedure: ESOPHAGOGASTRODUODENOSCOPY (EGD);  Surgeon: Gatha Mayer, MD;  Location: [facility] ENDOSCOPY;  Service: Endoscopy;  Laterality: N/A;  . I&D EXTREMITY Left 12/02/2015   Procedure: IRRIGATION AND DEBRIDEMENT OF FOOT; LEFT SECOND TOE AMPUTATION;  Surgeon: Meredith Pel, MD;  Location: [facility];  Service: Orthopedics;  Laterality: Left;  . I&D EXTREMITY Left 12/19/2015   Procedure: I & D LEFT FOOT WITH BEADS ;  Surgeon: Meredith Pel, MD;  Location: [facility];  Service: Orthopedics;  Laterality: Left;  . I&D EXTREMITY Right 01/17/2016   Procedure: IRRIGATION AND DEBRIDEMENT RIGHT FOOT;  Surgeon: Newt Minion, MD;  Location: [facility];  Service: Orthopedics;  Laterality: Right;  . INCISION AND DRAINAGE FOOT Right 01/17/2016  . INGUINAL HERNIA REPAIR Bilateral ~ 1983- ~ 1986  . TONSILLECTOMY  ~ 1985        Home Medications    Prior to Admission medications   Medication Sig Start Date End Date Taking? Authorizing Provider  acetaminophen (TYLENOL) 325 MG tablet Take 2 tablets (650 mg total) by mouth every 6 (six)  hours as needed for mild pain, moderate pain or fever (or Fever >/= 101). 08/11/17   Hongalgi, Lenis Dickinson, MD  aspirin EC 81 MG tablet Take 1 tablet (81 mg total) by mouth daily. 09/16/16   Funches, Adriana Mccallum, MD  atorvastatin (LIPITOR) 80 MG tablet Take 1 tablet (80 mg total) by mouth every morning. Patient taking differently: Take 80 mg by mouth daily.  03/03/17   Charlott Rakes, MD  Blood Glucose Monitoring Suppl (TRUE METRIX METER) DEVI 1 each by Does not apply route 3 (three) times daily. 03/17/17   Charlott Rakes, MD  carbamazepine (TEGRETOL) 200 MG tablet TAKE 1 TABLET BY MOUTH AT BEDTIME. 01/01/18   Eksir, Richard Miu, MD  carvedilol (COREG) 25 MG tablet Take 1 tablet (25 mg total) by mouth 2 (two) times daily with a meal. 11/20/17   Charlott Rakes, MD  Elastic Bandages & Supports (T.E.D. KNEE LENGTH/S-REGULAR) MISC Wear when awake 09/20/16   Pollina, Gwenyth Allegra, MD  FLUoxetine (PROZAC) 40 MG capsule Take 1 capsule (  40 mg total) by mouth daily. 08/06/17   Eksir, Richard Miu, MD  folic acid (FOLVITE) 1 MG tablet Take 1 tablet (1 mg total) by mouth daily. 07/04/16   Asencion Partridge, MD  gabapentin (NEURONTIN) 300 MG capsule Take 1 capsule (300 mg total) by mouth 2 (two) times daily. 11/20/17   Charlott Rakes, MD  glipiZIDE (GLUCOTROL XL) 10 MG 24 hr tablet Take 1 tablet (10 mg total) by mouth daily with breakfast. 11/20/17   Charlott Rakes, MD  glucose blood (ACCU-CHEK AVIVA PLUS) test strip Use as instructed 02/24/17   Charlott Rakes, MD  glucose blood (TRUE METRIX BLOOD GLUCOSE TEST) test strip 1 each by Other route 3 (three) times daily. 03/17/17   Charlott Rakes, MD  Insulin Glargine (LANTUS SOLOSTAR) 100 UNIT/ML Solostar Pen Inject 35 Units into the skin 2 (two) times daily. 11/20/17   Charlott Rakes, MD  lisinopril (PRINIVIL,ZESTRIL) 5 MG tablet Take 1 tablet (5 mg total) by mouth daily. 11/21/17   Charlott Rakes, MD  nitroGLYCERIN (NITRODUR - DOSED IN MG/24 HR) 0.2 mg/hr patch Place 1 patch (0.2 mg  total) onto the skin daily. 08/14/17   Newt Minion, MD  pantoprazole (PROTONIX) 40 MG tablet Take 1 tablet (40 mg total) by mouth daily. 12/30/17   Charlott Rakes, MD  pentoxifylline (TRENTAL) 400 MG CR tablet Take 1 tablet (400 mg total) by mouth 3 (three) times daily with meals. 08/11/17   Hongalgi, Lenis Dickinson, MD  pentoxifylline (TRENTAL) 400 MG CR tablet Take 1 tablet (400 mg total) by mouth 3 (three) times daily with meals. 08/14/17   Newt Minion, MD  polyethylene glycol [facility] / Floria Raveling) packet Take 17 g by mouth daily as needed for moderate constipation. Patient taking differently: Take 17 g by mouth daily as needed for moderate constipation. Mix in 8 oz liquid and drink 07/03/16   Asencion Partridge, MD  senna-docusate (SENOKOT-S) 8.6-50 MG tablet Take 1 tablet by mouth at bedtime. 07/03/16   Asencion Partridge, MD  silver sulfADIAZINE (SILVADENE) 1 % cream Apply topically 2 (two) times daily. Apply to wound on top of your right foot. Cover with dry dressing. 08/11/17   Hongalgi, Lenis Dickinson, MD  sucralfate (CARAFATE) 1 g tablet TAKE 1 TABLET (1 G TOTAL) BY MOUTH 4 (FOUR) TIMES DAILY - WITH MEALS AND AT BEDTIME. 11/24/17   Esterwood, Amy S, PA-C  traZODone (DESYREL) 100 MG tablet Take 2 tablets (200 mg total) by mouth at bedtime. 08/06/17   Eksir, Richard Miu, MD  Vitamin D, Ergocalciferol, (DRISDOL) 50000 units CAPS capsule Take 1 capsule (50,000 Units total) by mouth See admin instructions. Take one capsule (50000 units) by mouth on the 1st Sunday of each month 08/11/17   Modena Jansky, MD    Family History Family History  Problem Relation Age of Onset  . Hypertension Mother   . Diabetes Mother   . Hyperlipidemia Mother   . Heart disease Mother        s/p pacemaker  . Diabetes Father   . Hypertension Father   . Stroke Father   . Heart attack Father        first MI @ 37.  . Depression Father   . Dementia Father   . Stroke Brother   . ADD / ADHD Brother   . Anxiety disorder Brother   .  Bipolar disorder Brother   . OCD Brother   . Sexual abuse Brother     Social History Social History   Tobacco  Use  . Smoking status: Never Smoker  . Smokeless tobacco: Never Used  Substance Use Topics  . Alcohol use: No    Alcohol/week: 0.0 oz  . Drug use: No     Allergies   Lactose intolerance (gi); Other; and Milk-related compounds   Review of Systems Review of Systems  Cardiovascular: Negative for chest pain.  Neurological: Negative for headaches.  All other systems reviewed and are negative.    Physical Exam Updated Vital Signs BP (!) 151/99 (BP Location: Right Arm)   Temp 97.9 F (36.6 C) (Oral)   Resp 18   Ht 5\' 7"  (1.702 m)   Wt 91.2 kg (201 lb)   SpO2 98%   BMI 31.48 kg/m   Physical Exam  Constitutional: He appears well-developed and well-nourished.  HENT:  Head: Normocephalic and atraumatic.  Eyes: Conjunctivae are normal.  Neck: Neck supple.  Cardiovascular: Normal rate and regular rhythm.  No murmur heard. Pulmonary/Chest: Effort normal and breath sounds normal. No respiratory distress.  Abdominal: Soft. There is no tenderness.  Musculoskeletal: He exhibits tenderness. He exhibits no edema.  Amputation left foot,   Right foot redness dorsal aspect drainage.    Neurological: He is alert.  Skin: Skin is warm and dry.  Psychiatric: He has a normal mood and affect.  Nursing note and vitals reviewed.    ED Treatments / Results  Labs (all labs ordered are listed, but only abnormal results are displayed) Labs Reviewed  COMPREHENSIVE METABOLIC PANEL - Abnormal; Notable for the following components:      Result Value   Glucose, Bld 256 (*)    Creatinine, Ser 1.40 (*)    Calcium 8.7 (*)    Total Protein 6.3 (*)    Albumin 3.1 (*)    GFR calc non Af Amer 58 (*)    All other components within normal limits  CBC WITH DIFFERENTIAL/PLATELET - Abnormal; Notable for the following components:   RBC 3.70 (*)    Hemoglobin 11.0 (*)    HCT 35.7 (*)      All other components within normal limits  URINALYSIS, ROUTINE W REFLEX MICROSCOPIC - Abnormal; Notable for the following components:   APPearance HAZY (*)    Glucose, UA >=500 (*)    Protein, ur >=300 (*)    All other components within normal limits  I-STAT CG4 LACTIC ACID, ED    EKG None  Radiology Dg Foot Complete Right  Result Date: 01/10/2018 CLINICAL DATA:  Left foot open wound for 1 week. EXAM: RIGHT FOOT COMPLETE - 3+ VIEW COMPARISON:  None. FINDINGS: There is no evidence of fracture or dislocation. There is no evidence of arthropathy or other focal bone abnormality. No osteolysis. IMPRESSION: No evidence of active osteitis. Electronically Signed   By: Ulyses Jarred M.D.   On: 01/10/2018 17:04    Procedures Procedures (including critical care time)  Medications Ordered in ED Medications - No data to display   Initial Impression / Assessment and Plan / ED Course  I have reviewed the triage vital signs and the nursing notes.  Pertinent labs & imaging results that were available during my care of the patient were reviewed by me and considered in my medical decision making (see chart for details).     MDM  Xray no acute.  Iv ns started.  Iv antibiotic ordered.   Dr. Ashok Cordia in to see.   I will consult unassigned for admission.  Family Practice will see and admit Final Clinical Impressions(s) / ED  Diagnoses   Final diagnoses:  Cellulitis of foot, right    ED Discharge Orders    None       Sidney Ace 01/10/18 Willette Pa, MD 01/11/18 [phone]

## 2018-01-10 NOTE — Progress Notes (Signed)
Pharmacy Antibiotic Note  Shane [name] is a 47 y.o. male admitted on 01/10/2018 with cellulitis.  Pharmacy has been consulted for vancomycin dosing. Pt reports open wound on L foot.  Plan: -Vancomycin 1000mg  IV q12h -Monitor renal function, cultures, LOT -Vancomycin level as indicated   Height: 5\' 7"  (170.2 cm) Weight: 201 lb (91.2 kg) IBW/kg (Calculated) : 66.1  Temp (24hrs), Avg:97.9 F (36.6 C), Min:97.9 F (36.6 C), Max:97.9 F (36.6 C)  Recent Labs  Lab 01/10/18 1345 01/10/18 1414  WBC 9.7  --   CREATININE 1.40*  --   LATICACIDVEN  --  1.52    Estimated Creatinine Clearance: 69.5 mL/min (A) (by C-G formula based on SCr of 1.4 mg/dL (H)).    Allergies  Allergen Reactions  . Lactose Intolerance (Gi) Diarrhea and Other (See Comments)    Bloating (also)  . Other Other (See Comments)    Red meat causes stomach pains, bloating and diarrhea  . Milk-Related Compounds Diarrhea and Other (See Comments)    Any dairy products  - diarrhea and bloating    Antimicrobials this admission: 7/27 Clindamycin x1 7/27 Vanco >>  Dose adjustments this admission: none  Microbiology results: none  Thank you for allowing pharmacy to be a part of this patient's care.  Arrie Senate, PharmD, BCPS Clinical Pharmacist [phone] Please check AMION for all [place] numbers 01/10/2018

## 2018-01-10 NOTE — ED Notes (Signed)
Admitting Provider at bedside. 

## 2018-01-11 DIAGNOSIS — N183 Chronic kidney disease, stage 3 (moderate): Secondary | ICD-10-CM

## 2018-01-11 DIAGNOSIS — Z794 Long term (current) use of insulin: Secondary | ICD-10-CM

## 2018-01-11 DIAGNOSIS — E1122 Type 2 diabetes mellitus with diabetic chronic kidney disease: Secondary | ICD-10-CM

## 2018-01-11 DIAGNOSIS — L03115 Cellulitis of right lower limb: Secondary | ICD-10-CM

## 2018-01-11 LAB — GLUCOSE, CAPILLARY
GLUCOSE-CAPILLARY: 192 mg/dL — AB (ref 70–99)
GLUCOSE-CAPILLARY: 168 mg/dL — AB (ref 70–99)
Glucose-Capillary: 133 mg/dL — ABNORMAL HIGH (ref 70–99)
Glucose-Capillary: 185 mg/dL — ABNORMAL HIGH (ref 70–99)
Glucose-Capillary: 241 mg/dL — ABNORMAL HIGH (ref 70–99)

## 2018-01-11 LAB — COMPREHENSIVE METABOLIC PANEL
ALT: 19 U/L (ref 0–44)
AST: 16 U/L (ref 15–41)
Albumin: 3.1 g/dL — ABNORMAL LOW (ref 3.5–5.0)
Alkaline Phosphatase: 68 U/L (ref 38–126)
Anion gap: 6 (ref 5–15)
BILIRUBIN TOTAL: 0.6 mg/dL (ref 0.3–1.2)
BUN: 13 mg/dL (ref 6–20)
CO2: 31 mmol/L (ref 22–32)
CREATININE: 1.24 mg/dL (ref 0.61–1.24)
Calcium: 8.7 mg/dL — ABNORMAL LOW (ref 8.9–10.3)
Chloride: 104 mmol/L (ref 98–111)
GFR calc Af Amer: >60 mL/min (ref >60)
GFR calc non Af Amer: >60 mL/min (ref >60)
Glucose, Bld: 166 mg/dL — ABNORMAL HIGH (ref 70–99)
POTASSIUM: 3.7 mmol/L (ref 3.5–5.1)
Sodium: 141 mmol/L (ref 135–145)
TOTAL PROTEIN: 6.3 g/dL — AB (ref 6.5–8.1)

## 2018-01-11 LAB — PSA: PROSTATIC SPECIFIC ANTIGEN: 4.23 ng/mL — AB (ref 0.00–4.00)

## 2018-01-11 LAB — CBC
HCT: 35.2 % — ABNORMAL LOW (ref 39.0–52.0)
Hemoglobin: 11 g/dL — ABNORMAL LOW (ref 13.0–17.0)
MCH: 29.9 pg (ref 26.0–34.0)
MCHC: 31.3 g/dL (ref 30.0–36.0)
MCV: 95.7 fL (ref 78.0–100.0)
PLATELETS: 212 10*3/uL (ref 150–400)
RBC: 3.68 MIL/uL — ABNORMAL LOW (ref 4.22–5.81)
RDW: 12.8 % (ref 11.5–15.5)
WBC: 7.3 10*3/uL (ref 4.0–10.5)

## 2018-01-11 MED ORDER — HYDRALAZINE HCL 20 MG/ML IJ SOLN: 10.0000 mg | INTRAMUSCULAR | Status: DC | PRN

## 2018-01-11 MED ORDER — DIPHENHYDRAMINE HCL 25 MG PO CAPS
25.0000 mg | ORAL_CAPSULE | Freq: Once | ORAL | Status: AC
  Administered 2018-01-11: 25 mg via ORAL
  Filled 2018-01-11: qty 1

## 2018-01-11 MED ORDER — PENTOXIFYLLINE ER 400 MG PO TBCR
400.0000 mg | EXTENDED_RELEASE_TABLET | Freq: Three times a day (TID) | ORAL | Status: DC
  Administered 2018-01-11 – 2018-01-13 (×3): 400 mg via ORAL
  Filled 2018-01-11 (×8): qty 1

## 2018-01-11 MED ORDER — OXYCODONE HCL 5 MG PO TABS
5.0000 mg | ORAL_TABLET | ORAL | Status: DC | PRN
  Administered 2018-01-11 – 2018-01-12 (×4): 5 mg via ORAL
  Filled 2018-01-11 (×4): qty 1

## 2018-01-11 MED ORDER — INSULIN GLARGINE 100 UNIT/ML ~~LOC~~ SOLN
20.0000 [IU] | Freq: Every day | SUBCUTANEOUS | Status: DC
  Administered 2018-01-11 – 2018-01-12 (×2): 20 [IU] via SUBCUTANEOUS
  Filled 2018-01-11 (×3): qty 0.2

## 2018-01-11 MED ORDER — LISINOPRIL 5 MG PO TABS
5.0000 mg | ORAL_TABLET | Freq: Every day | ORAL | Status: DC
Start: 2018-01-11 — End: 2018-01-13
  Administered 2018-01-11 – 2018-01-12 (×2): 5 mg via ORAL
  Filled 2018-01-11 (×3): qty 1

## 2018-01-11 NOTE — Consult Note (Signed)
New Site Nurse wound consult note Reason for Consult:Osteomyelitis to right dorsal foot and right lateral great toe.  History partial left foot amputation.  Diabetes and PAD.   Wound type:Neuropathic, infectious wounds to right foot.  Pressure Injury POA: /NA Measurement:RIght great toe, lateral:  2 cmx 0.2 cm x 0.4 cm  Right dorsal foot:  2.6 cm x 1 cm x 0.3 cm  Wound VVL:RTJWW red Drainage (amount, consistency, odor) moderate serosanguinous   No odor Periwound:edema and erythema.  Improving pain Dressing procedure/placement/frequency:Cleanse wounds to right foot with NS.  Cut Aquacel into thin strip and pack into left lateral great toe wound.  Fill wound bed on dorsal foot with Aquacel Ag.  COver with 4x4 gauze and kerlix/tape.  Change daily.  Will not follow at this time.  Please re-consult if needed.  Domenic Moras RN BSN [place] Pager [phone]

## 2018-01-11 NOTE — Progress Notes (Signed)
Family Medicine Teaching Service Daily Progress Note Intern Pager: [phone]  Patient name: Shane [name] Medical record number: [id number] Date of birth: 1970/08/06 Age: 47 y.o. Gender: male  Primary Care Provider: Charlott Rakes, MD Consultants: None Code Status: Full  Pt Overview and Major Events to Date:  Admitted 7/27  Assessment and Plan: Patient is a 47 yo male with a past medical history significant for uncontrolled T2DM, prior partial left foot amputation, HTN depression, insomnia who presented with right foot swelling and pain in the setting of recent wound concerning for infection/cellulitis.  #Right foot infection/Cellulitis, subacute, worsening Patient presents with 2-3weeks of gradual worsening left foot pain swelling an pain. Patient started himself on amoxicillin ( left over from prior admission in December for similar problem) 8 days ago. Continued swelling and pain leading skin breakdown on dorsal aspect (10 days ago) and medial hallux (4 days ago). Foot is warm and pulses were palpated. Both wounds are superficial appearing and likely secondary to PAD and poorly controlled DM. No evidence of bone involvement on Xray. Low suspicion for osteomyelitis. Both ESR and CRP elevated. History of left transmetatarsal amputation ( hallux, 2nd and 3rd toes) 2/2 osteomyelitis.  --Wound care consult  --Follow up on MRI --Ortho consult as needed --Glycemic control --Continue Vancomycin --Stop clindamycin  --f/u on blood cultures --Discuss trental prior to discharge  #T2DM, uncontrolled  Last A1c on 6/7 was 9.4. Uncontrolled. Current regimen lantus 40 and glipizide at home. Will need tighter control given PAD, right foot wound and history of left foot amputation. BG on admission 256 likely elevated in the setting of infection. --moderate SSI --Restart Lantus at 20u --Hold glipizide  --Continue gabapentin 300 mg daily, reduce dose given worsening kidney function --CBG  AC/qhs  #CKD-III, stable  Patient's creatinine on admission 1.40, (baseline 1.3-1.4) recent creatinine on 6/6 was 1.51 with significant proteinuria noted. Likely progression of medical disease in the setting of uncontrolled T2DM. Will need improve glycemic control --Currently on lisinopril  --Avoid nephrotoxic agent  #HTN/HFpEF Last ECHO 07/2016 with normal EF 60-65%, no abnormal wall motion of significant valvular disease noted. Echo in 2017 showed mildly reduced EF 55-60%, with mild LVH, trace AI and mild MR. Home regimen includes carvedilol, lisinopril. On admission, BP 151/99, continue to increase throughout admission max 230/119 likely secondary to med non adherence and pain. --Start patient on hydralazine 10 mg IV q4 prn --Resume Coreg and lisinopril  #Depression, chronic --Continue home prozac  #Insomnia, chronic --Continue carbamazepine? --Continue Trazadone 100 mg qhs  #Constipation, chronic Uncontrolled T2DM, element of gastroparesis likely contributing to reported constipation --Continue senna/miralax  #Urinary retention Describes low flow suspicious for BPH. Patient does have worsening kidney function with significant proteinuria likely secondary to uncontrolled T2DM --Consider checking PSA --Could start patient on Tamsulosin 0.4 mg  --Follow up with outpatient urology    FEN/GI: Carb modified PPx: Protonix   Disposition: Home pending clinical improvement  Subjective:  Patient is feeling better this morning, however still complaining of tenderness. Had some bleeding from the wound and had nursing change dressing. No Fever, chills, nausea, vomiting.  Objective:      Temp:  [97.9 F (36.6 C)] 97.9 F (36.6 C) (07/27 1329) Pulse Rate:  [75-83] 83 (07/28 0015) Resp:  [18] 18 (07/27 1329) BP: (151-230)/(99-120) 180/103 (07/28 0015) SpO2:  [96 %-100 %] 97 % (07/28 0015) Weight:  [201 lb (91.2 kg)] 201 lb (91.2 kg) (07/27 2048)   Physical Exam: General:  NAD, pleasant/conversational Eyes: clear sclera, no injections  ENTM: clear nares, mmm Cardiovascular: RRR, no mumurs noted Respiratory: ctab, no IWB, no wheezes/cough Gastrointestinal: soft belly, obese, nopain MSK: left foot partial amputation Right foot with swelling and warm discoloration of foot, warmth and discoloration to mid shin.  There is a ~3inch split in skin on dorsal foot with exposed subcutaneous fatty tissue and a ~1inch split on medial great toe through epidermis Neuro: grossly intact Psych: pleasant, AOx3 appropriate thought content   Laboratory: Recent Labs  Lab 01/10/18 1345  WBC 9.7  HGB 11.0*  HCT 35.7*  PLT 210   Recent Labs  Lab 01/10/18 1345  NA 140  K 4.2  CL 106  CO2 27  BUN 20  CREATININE 1.40*  CALCIUM 8.7*  PROT 6.3*  BILITOT 0.3  ALKPHOS 59  ALT 21  AST 20  GLUCOSE 256*    Imaging/Diagnostic Tests: Dg Foot Complete Right  Result Date: 01/10/2018 CLINICAL DATA:  Left foot open wound for 1 week. EXAM: RIGHT FOOT COMPLETE - 3+ VIEW COMPARISON:  None. FINDINGS: There is no evidence of fracture or dislocation. There is no evidence of arthropathy or other focal bone abnormality. No osteolysis. IMPRESSION: No evidence of active osteitis. Electronically Signed   By: Ulyses Jarred M.D.   On: 01/10/2018 17:04    Marjie Skiff, MD 01/11/2018, 5:53 AM PGY-3, [place] Intern pager: [phone], text pages welcome

## 2018-01-11 NOTE — Progress Notes (Signed)
Patient called this RN into the room and stated that he felt hot, sweaty, and itchy. CBG and V/S checked and stable. Patient has no other complaints. Patient believes it was the trental that I gave him at approximately 1300. MD notified and made aware. Awaiting orders. Will continue to monitor.

## 2018-01-12 ENCOUNTER — Inpatient Hospital Stay (HOSPITAL_COMMUNITY): Payer: Medicaid Other

## 2018-01-12 DIAGNOSIS — I739 Peripheral vascular disease, unspecified: Secondary | ICD-10-CM

## 2018-01-12 DIAGNOSIS — F32A Depression, unspecified: Secondary | ICD-10-CM

## 2018-01-12 DIAGNOSIS — F329 Major depressive disorder, single episode, unspecified: Secondary | ICD-10-CM

## 2018-01-12 LAB — BASIC METABOLIC PANEL
ANION GAP: 7 (ref 5–15)
BUN: 15 mg/dL (ref 6–20)
CALCIUM: 8.5 mg/dL — AB (ref 8.9–10.3)
CHLORIDE: 104 mmol/L (ref 98–111)
CO2: 31 mmol/L (ref 22–32)
Creatinine, Ser: 1.42 mg/dL — ABNORMAL HIGH (ref 0.61–1.24)
GFR calc non Af Amer: 57 mL/min — ABNORMAL LOW (ref >60)
Glucose, Bld: 211 mg/dL — ABNORMAL HIGH (ref 70–99)
POTASSIUM: 3.8 mmol/L (ref 3.5–5.1)
Sodium: 142 mmol/L (ref 135–145)

## 2018-01-12 LAB — GLUCOSE, CAPILLARY
GLUCOSE-CAPILLARY: 158 mg/dL — AB (ref 70–99)
Glucose-Capillary: 84 mg/dL (ref 70–99)
Glucose-Capillary: 197 mg/dL — ABNORMAL HIGH (ref 70–99)
Glucose-Capillary: 132 mg/dL — ABNORMAL HIGH (ref 70–99)
Glucose-Capillary: 232 mg/dL — ABNORMAL HIGH (ref 70–99)

## 2018-01-12 MED ORDER — TAMSULOSIN HCL 0.4 MG PO CAPS
0.4000 mg | ORAL_CAPSULE | Freq: Every day | ORAL | Status: DC
  Administered 2018-01-13: 0.4 mg via ORAL
  Filled 2018-01-12: qty 1

## 2018-01-12 MED ORDER — DOXYCYCLINE HYCLATE 100 MG PO TABS
100.0000 mg | ORAL_TABLET | Freq: Two times a day (BID) | ORAL | Status: DC
Start: 2018-01-12 — End: 2018-01-13
  Administered 2018-01-12 – 2018-01-13 (×2): 100 mg via ORAL
  Filled 2018-01-12 (×2): qty 1

## 2018-01-12 MED ORDER — GADOBENATE DIMEGLUMINE 529 MG/ML IV SOLN
20.0000 mL | Freq: Once | INTRAVENOUS | Status: AC
  Administered 2018-01-12: 19 mL via INTRAVENOUS

## 2018-01-12 NOTE — Progress Notes (Addendum)
Family Medicine Teaching Service Daily Progress Note Intern Pager: [phone]  Patient name: Shane [name] Medical record number: [id number] Date of birth: Apr 22, 1971 Age: 47 y.o. Gender: male  Primary Care Provider: Charlott Rakes, MD Consultants: WOC Code Status: Full  Pt Overview and Major Events to Date:  Admitted 7/27  Assessment and Plan: Patient is a 47 yo male with PMH significant for uncontrolled T2DM, prior partial left foot amputation, HTN, depression, insomnia who presented with right foot swelling and pain in the setting of recent wound concerning for infection/cellulitis.  Right foot infection/Cellulitis, subacute, worsening: No evidence of bone involvement on Xray. Low suspicion for osteomyelitis. Both ESR and CRP elevated. History of left transmetatarsal amputation (hallux, 2nd and 3rd toes) 2/2 osteomyelitis.  - Admit to med-surg, attending Dr. Gwendlyn Deutscher - Wound care signed off - MRI R Foot (7/29) - read pending - Ortho consult if suspicion for Osteo - Glycemic control - Continue Vancomycin (Stopped clindamycin) - Monitor Trental use for allergic reaction prior to discharge  T2DM, uncontrolled: last A1c 9.4 (11/21/17). Current regimen lantus 40 and glipizide at home. BG on admission 256. - moderate SSI - Lantus at 20U qHS - Hold glipizide  - Continue Gabapentin 300 mg BID - CBG AC/qhs (158 up to 241 in 24 hours)  CKD-II, stable: GFR 54 > 58 > 60+; Cr on admission 1.40, (baseline 1.3-1.4). 1.4 > 1.24 (7/28) - Continue lisinopril  - Avoid nephrotoxic agents  HTN/HFpEF: EF 60-65% (07/2016), BP max 230/119. - Start patient on Hydralazine 10 mg IV q4 prn - Resume Coreg 25 BID and lisinopril 59m  Depression, chronic - Continue home Prozac 40  Insomnia, chronic - Continue carbamazepine 200 - Continue Trazadone 100 mg qHS  Constipation, chronic: gastroparesis 2/2 uncontrolled T2DM likely contributing - Continue senna/miralax  Urinary retention: Describes  low flow suspicious for BPH. Patient does have worsening kidney function with significant proteinuria likely secondary to uncontrolled T2DM - PSA 4.23 H - Consider Tamsulosin 0.4 mg  - Follow up outpatient urology    FEN/GI: Carb modified PPx: Protonix   Disposition: Home pending clinical improvement  Subjective:  He states he hit his foot yesterday while ambulating the room and it bled a lot. He also reports he became itchy and hot when he took his Trental yesterday, but it resolved. He is concerned he had an allergic reaction, although he has taken the medication many times before without problem. Otherwise he states he is feeling well. He denies fever, chills, changed blurry vision, weakness, headaches, dizziness, nausea, vomiting, diarrhea, and dysuria.  Objective: Temp:  [98 F (36.7 C)-98.6 F (37 C)] 98 F (36.7 C) (07/29 0511) Pulse Rate:  [75-80] 75 (07/29 0511) Resp:  [14-18] 14 (07/29 0511) BP: (114-177)/(68-107) 165/94 (07/29 0511) SpO2:  [94 %-100 %] 94 % (07/29 0511) Weight:  [203 lb 14.8 oz (92.5 kg)] 203 lb 14.8 oz (92.5 kg) (07/29 0511) Physical Exam: General:NAD, pleasant/conversational Eyes:clear sclera  Cardiovascular:RRR, S1S2 present, no MRG Respiratory:CTAB, no wheezes/cough Gastrointestinal:soft belly, bowel sounds in 4 quadrants, no tenderness or pain to palpation MUXL:KGMWfoot partial amputation; Right foot with swelling and warm discoloration of foot, warmth and discoloration to mid shin. There is a ~3inch split in skin on dorsal foot with exposed subcutaneous fatty tissue and a ~1inch split on medial great toe through epidermis Neuro:grossly intact Psych:pleasant, AOx3 appropriate thought content  Laboratory: CBC Latest Ref Rng & Units 01/11/2018 01/10/2018 08/09/2017  WBC 4.0 - 10.5 K/uL 7.3 9.7 9.6  Hemoglobin 13.0 - 17.0  g/dL 11.0(L) 11.0(L) 10.7(L)  Hematocrit 39.0 - 52.0 % 35.2(L) 35.7(L) 34.4(L)  Platelets 150 - 400 K/uL 212 210 199    CMP Latest Ref Rng & Units 01/12/2018 01/11/2018 01/10/2018  Glucose 70 - 99 mg/dL 211(H) 166(H) 256(H)  BUN 6 - 20 mg/dL _0 Creatinine 0.61 - 1.24 mg/dL 1.42(H) 1.24 1.40(H)  Sodium 135 - 145 mmol/L 142 141 140  Potassium 3.5 - 5.1 mmol/L 3.8 3.7 4.2  Chloride 98 - 111 mmol/L 104 104 106  CO2 22 - 32 mmol/L _1 Calcium 8.9 - 10.3 mg/dL 8.5(L) 8.7(L) 8.7(L)  Total Protein 6.5 - 8.1 g/dL - 6.3(L) 6.3(L)  Total Bilirubin 0.3 - 1.2 mg/dL - 0.6 0.3  Alkaline Phos 38 - 126 U/L - 68 59  AST 15 - 41 U/L - 16 20  ALT 0 - 44 U/L - 19 21   Urinalysis    Component Value Date/Time   COLORURINE YELLOW 01/10/2018 1335   APPEARANCEUR HAZY (A) 01/10/2018 1335   LABSPEC 1.027 01/10/2018 1335   PHURINE 5.0 01/10/2018 1335   GLUCOSEU >=500 (A) 01/10/2018 1335   HGBUR NEGATIVE 01/10/2018 1335   BILIRUBINUR NEGATIVE 01/10/2018 1335   BILIRUBINUR negative 03/03/2017 1627   KETONESUR NEGATIVE 01/10/2018 1335   PROTEINUR >=300 (A) 01/10/2018 1335   UROBILINOGEN 0.2 03/03/2017 1627   UROBILINOGEN 0.2 04/24/2015 1056   NITRITE NEGATIVE 01/10/2018 1335   LEUKOCYTESUR NEGATIVE 01/10/2018 1335   PSA (7/28): 4.23 H CRP (7/27): 1.2 H ESR: 22 > 50 > 45 (7/27) Lactic Acid (7/27): 1.52 Microalbumin/Cr Ratio (11/20/17): 550.3  Imaging/Diagnostic Tests: XRay Foot Complete Right, 01/10/2018 = No evidence of active osteitis  MR Foot (7/29): Pending  Milus Banister, [place], PGY-1 01/12/2018 2:40 PM [place] Intern pager: [phone], text pages welcome

## 2018-01-12 NOTE — Progress Notes (Signed)
Inpatient Diabetes Program Recommendations  AACE/ADA: New Consensus Statement on Inpatient Glycemic Control (2015)  Target Ranges:  Prepandial:   less than 140 mg/dL      Peak postprandial:   less than 180 mg/dL (1-2 hours)      Critically ill patients:  140 - 180 mg/dL   Review of Glycemic Control  Diabetes history: DM 2 DM meds at home: Lantus 40 units, Glipizide 10 mg Daily  Spoke with patient about diabetes and home regimen for diabetes control. Patient reports that he is followed by his PCP and last saw her June 6th, Changes were made at that time. Next follow up appointment is in September. Patient checks his glucose twice a day in the morning and evenings before bed. Patient reports glucose in the 100's in the morning and 200's in the evening. Patient reports his last A1c was around 9%. Discussed current A1c level 9.4%. Patient reports his goal should be at 7%. Patient also admitted to drinking sweet tea during the day. Patient also reports struggling with depression and sometimes does not care and does not take care of himself.  Explained how hyperglycemia leads to damage within blood vessels which lead to the common complications seen with uncontrolled diabetes. Stressed to the patient the importance of improving glycemic control to prevent further complications from uncontrolled diabetes. Discussed impact of nutrition, exercise, stress, sickness, and medications on diabetes control. Discussed carbohydrates, carbohydrate goals per day and meal, along with portion sizes. Discussed other beverage options for patient to drink besides the sweet tea.   Patient verbalized understanding of information discussed and he states that he has no further questions at this time related to diabetes.  Thanks,  Tama Headings RN, MSN, [facility] Inpatient Diabetes Coordinator Team Pager [phone] (8a-5p)

## 2018-01-12 NOTE — Progress Notes (Signed)
Family Medicine Teaching Service Daily Progress Note Intern Pager: [phone]  Patient name: Shane [name] Medical record number: [id number] Date of birth: 1970-09-09 Age: 47 y.o. Gender: male  Primary Care Provider: Charlott Rakes, MD Consultants:WOC Code Status:Full  Pt Overview and Major Events to Date: Admitted 7/27  Assessment and Plan: Patient is a 47 yo male with PMH significant for uncontrolled T2DM, prior partial left foot amputation, HTN, depression, insomnia who presented with right foot swelling and pain in the setting of recent wound concerning for infection/cellulitis.  Right foot infection/Cellulitis, subacute, worsening: No evidence of bone involvement on Xray. Low suspicion for osteomyelitis. Both ESR and CRP elevated. History of left transmetatarsal amputation (hallux, 2nd and 3rd toes) 2/2 osteomyelitis.  - Admit to med-surg, attending Dr. Gwendlyn Deutscher - Wound care signed off - MRI R Foot (7/29) - no osteomyelitis - Glycemic control - Start Doxy 182m PO BID (7/30), d/c Vanc 7/30. - Monitor Trental use for allergic reaction prior to discharge  T2DM,uncontrolled: last A1c 9.4 (11/21/17). Current regimenlantus 40 and glipizide at home. BG on admission 256. - moderateSSI - Lantus at 20U qHS - Hold glipizide  - Continue Gabapentin 300 mg BID - CBG AC/qhs (158 up to 241 in 24 hours)  CKD III, stable: GFR 54 > 58 > 60+; Cr on admission 1.40, (baseline 1.3-1.4). 1.4 > 1.24 > 1.42 (7/29) - Continue lisinopril  - Avoid nephrotoxic agents  HTN/HFpEF: 144/87 (7/30). EF 60-65% (07/2016), BP max 230/119. - Start patient on Hydralazine 10 mg IV q4 prn - Resume Coreg 25 BID - Increase lisinopril from 568mto 1056m7/30)  Depression, chronic - Continue home Prozac 40  Insomnia, chronic - Continuecarbamazepine 200 - Continue Trazadone100 mg qHS  Constipation, chronic: gastroparesis 2/2 uncontrolled T2DM likely contributing - Continuesenna/miralax  Urinary  retention: Describes low flow suspicious for BPH. Patient does have worsening kidney function with significant proteinuria likely secondary to uncontrolled T2DM - PSA 4.23 H - StartedTamsulosin 0.4 mg  - Follow up outpatient urology  FEN/GI:Carb modified PPxDGL:OVFIEPPIisposition:Home pending clinical improvement  Subjective: The patient was seen this morning resting in bed. He states he feels better and that his foot remains a little sore. He is very happy to hear he does not have osteomyelitis in his right foot. He reported having difficulty initiating stream with urination and asked if prostate problems can also cause erectile dysfunction. Otherwise he states he is feeling well. He denies fever, chills, changed blurry vision, weakness, headaches, dizziness, nausea, vomiting, diarrhea, and dysuria.  Objective: Vitals:   01/13/18 0435 01/13/18 0817  BP: (!) 153/90 (!) 144/87  Pulse: 79 77  Resp: 20 18  Temp: 98.3 F (36.8 C) 98.4 F (36.9 C)  SpO2: 95% 91%   Physical Exam: General:NAD, pleasant/conversational Eyes:clear sclera  Cardiovascular:RRR, S1S2 present, no MRG Respiratory:CTAB, no wheezes/cough Gastrointestinal:soft belly, bowel sounds in 4 quadrants, no tenderness or pain to palpation MSKRJJ:OACZot partial amputation; Right foot with swelling and warm discoloration of foot, warmth and discoloration to mid shin. There is a ~3inch split in skin on dorsal foot with exposedsubcutaneous fatty tissueand a ~1inch split on medial great toe through epidermis Neuro:grossly intact Psych:pleasant, AOx3 appropriate thought content  Laboratory: CBC Latest Ref Rng & Units 01/11/2018 01/10/2018 08/09/2017  WBC 4.0 - 10.5 K/uL 7.3 9.7 9.6  Hemoglobin 13.0 - 17.0 g/dL 11.0(L) 11.0(L) 10.7(L)  Hematocrit 39.0 - 52.0 % 35.2(L) 35.7(L) 34.4(L)  Platelets 150 - 400 K/uL 212 210 199   CMP Latest Ref Rng &  Units 01/12/2018 01/11/2018 01/10/2018  Glucose 70 - 99 mg/dL 211(H)  166(H) 256(H)  BUN 6 - 20 mg/dL 15 13 20   Creatinine 0.61 - 1.24 mg/dL 1.42(H) 1.24 1.40(H)  Sodium 135 - 145 mmol/L 142 141 140  Potassium 3.5 - 5.1 mmol/L 3.8 3.7 4.2  Chloride 98 - 111 mmol/L 104 104 106  CO2 22 - 32 mmol/L 31 31 27   Calcium 8.9 - 10.3 mg/dL 8.5(L) 8.7(L) 8.7(L)  Total Protein 6.5 - 8.1 g/dL - 6.3(L) 6.3(L)  Total Bilirubin 0.3 - 1.2 mg/dL - 0.6 0.3  Alkaline Phos 38 - 126 U/L - 68 59  AST 15 - 41 U/L - 16 20  ALT 0 - 44 U/L - 19 21   Urinalysis    Component Value Date/Time   COLORURINE YELLOW 01/10/2018 1335   APPEARANCEUR HAZY (A) 01/10/2018 1335   LABSPEC 1.027 01/10/2018 1335   PHURINE 5.0 01/10/2018 1335   GLUCOSEU >=500 (A) 01/10/2018 1335   HGBUR NEGATIVE 01/10/2018 1335   BILIRUBINUR NEGATIVE 01/10/2018 1335   BILIRUBINUR negative 03/03/2017 1627   KETONESUR NEGATIVE 01/10/2018 1335   PROTEINUR >=300 (A) 01/10/2018 1335   UROBILINOGEN 0.2 03/03/2017 1627   UROBILINOGEN 0.2 04/24/2015 1056   NITRITE NEGATIVE 01/10/2018 1335   LEUKOCYTESUR NEGATIVE 01/10/2018 1335   PSA (7/28): 4.23 H CRP (7/27): 1.2 H ESR: 22 > 50 > 45 (7/27) Lactic Acid (7/27): 1.52 Microalbumin/Cr Ratio (11/20/17): 550.3  Imaging/Diagnostic Tests: XRay Foot Complete Right, 01/10/2018 = No evidence of active osteitis  MR Foot (7/29): 2 skin ulcerations are seen on the foot without abscess, osteomyelitis or septic joint.   Milus Banister, [place], PGY-1 01/13/2018 8:56 AM [facility] Intern pager: [phone], text pages welcome

## 2018-01-13 LAB — GLUCOSE, CAPILLARY
GLUCOSE-CAPILLARY: 171 mg/dL — AB (ref 70–99)
GLUCOSE-CAPILLARY: 220 mg/dL — AB (ref 70–99)

## 2018-01-13 MED ORDER — TAMSULOSIN HCL 0.4 MG PO CAPS: 0.4000 mg | ORAL_CAPSULE | Freq: Every day | ORAL | 0 refills | Status: DC

## 2018-01-13 MED ORDER — DOXYCYCLINE HYCLATE 100 MG PO TABS: 100.0000 mg | ORAL_TABLET | Freq: Two times a day (BID) | ORAL | 0 refills | Status: DC

## 2018-01-13 MED ORDER — TRAZODONE HCL 100 MG PO TABS: 100.0000 mg | ORAL_TABLET | Freq: Every day | ORAL | Status: AC

## 2018-01-13 MED ORDER — LISINOPRIL 10 MG PO TABS: 10.0000 mg | ORAL_TABLET | Freq: Every day | ORAL | 0 refills | Status: DC

## 2018-01-13 MED ORDER — LISINOPRIL 10 MG PO TABS
10.0000 mg | ORAL_TABLET | Freq: Every day | ORAL | Status: DC
  Administered 2018-01-13: 10 mg via ORAL
  Filled 2018-01-13: qty 1

## 2018-01-13 MED FILL — DOXYCYCLINE HYCLATE 100 MG: 100 | 7 days supply | Qty: 14 | Fill #0

## 2018-01-13 MED FILL — LISINOPRIL 10 MG TABS: 10 | 30 days supply | Qty: 30 | Fill #0

## 2018-01-13 MED FILL — TAMSULOSIN HCL 0.4 MG CAP: 0.4 | 30 days supply | Qty: 30 | Fill #0

## 2018-01-13 NOTE — Discharge Instructions (Signed)

## 2018-01-13 NOTE — Progress Notes (Signed)
Patient discharged to home. Patient AVS reviewed and signed. Patient capable re-verbalizing medications and follow-up appointments. IV removed. Patient belongings sent with patient. Patient educated to return to the ED in the event of SOB, chest pain or dizziness.   Jaimon Bugaj B. RN 

## 2018-01-13 NOTE — Discharge Summary (Signed)
[facility] Discharge Summary  Patient name: Shane [name] Medical record number: [id number] Date of birth: 10/19/1970 Age: 47 y.o. Gender: male Date of Admission: 01/10/2018  Date of Discharge: 01/13/2018 Admitting Physician: Kinnie Feil, MD  Primary Care Provider: Charlott Rakes, MD Consultants: [place]  Indication for Hospitalization: Foot cellulitis  Discharge Diagnoses/Problem List:  Right foot infection/Cellulitis T2DM Prior partial left foot amputation HTN HFpEF PAD Depression Insomnia CKD III Constipation Urinary Retention Erectile Dysfunction  Disposition: Discharge home  Discharge Condition: Stable  Discharge Exam:  General:NAD, pleasant/conversational Eyes:clear sclera  Cardiovascular:RRR,S1S2 present, no MRG Respiratory:CTAB, no wheezes/cough Gastrointestinal:soft belly,bowel sounds in 4 quadrants,notenderness orpainto palpation MKL:KJZP foot partial amputation;Right foot with swelling and warm discoloration of foot, warmth and discoloration to mid shin. There is a ~3inch split in skin on dorsal foot with exposedsubcutaneous fatty tissueand a ~1inch split on medial great toe through epidermis Neuro:grossly intact Psych:pleasant, AOx3 appropriate thought content  Brief Hospital Course:  [name] was admitted for three weeks of right foot cellulitis with open wounds. He was started on Vancomycin and Clindamycin and wound care was consulted. X-Ray and MRI were negative for osteomyelitis and the patient was switched from vanc and clinda to doxycycline. The patient was also started on Trental, which had previously been prescribed to him for PAD. When he took the medication he became hot, sweaty and itchy and the medication was discontinued. The patient also expressed concern with delayed initiation of urinary stream and he was started on Tamsulosin. He also expressed problems with erectile dysfunction and was  instructed to follow up outpatient. He was medically cleared and discharged on 01/13/2018.  Issues for Follow Up:  1. Ensure wound healing on dorsum R foot- HH nursing ordered at time of d/c 2. Recommend monitoring BP closely- increased lisinopril to 10 mg at time of discharge 3. Recheck BMP to monitor Cr and K 4. Consider Cialis for ED and BPH if flomax not working 5. Consider adding wellbutrin for depression and decreased sexual drive 6. Ensure completion of doxycycline for foot wound  Significant Procedures: none  Significant Labs and Imaging:  Recent Labs  Lab 01/10/18 1345 01/11/18 0745  WBC 9.7 7.3  HGB 11.0* 11.0*  HCT 35.7* 35.2*  PLT 210 212   Recent Labs  Lab 01/10/18 1345 01/11/18 0745 01/12/18 0834  NA 140 141 142  K 4.2 3.7 3.8  CL 106 104 104  CO2 27 31 31   GLUCOSE 256* 166* 211*  BUN 20 13 15   CREATININE 1.40* 1.24 1.42*  CALCIUM 8.7* 8.7* 8.5*  ALKPHOS 59 68  --   AST 20 16  --   ALT 21 19  --   ALBUMIN 3.1* 3.1*  --    Urinalysis    Component Value Date/Time   COLORURINE YELLOW 01/10/2018 1335   APPEARANCEUR HAZY (A) 01/10/2018 1335   LABSPEC 1.027 01/10/2018 1335   PHURINE 5.0 01/10/2018 1335   GLUCOSEU >=500 (A) 01/10/2018 1335   HGBUR NEGATIVE 01/10/2018 1335   BILIRUBINUR NEGATIVE 01/10/2018 1335   BILIRUBINUR negative 03/03/2017 1627   KETONESUR NEGATIVE 01/10/2018 1335   PROTEINUR >=300 (A) 01/10/2018 1335   UROBILINOGEN 0.2 03/03/2017 1627   UROBILINOGEN 0.2 04/24/2015 1056   NITRITE NEGATIVE 01/10/2018 1335   LEUKOCYTESUR NEGATIVE 01/10/2018 1335   PSA (7/28): 4.23 H CRP (7/27): 1.2 H ESR: 22 >50 >45 (7/27) Lactic Acid (7/27): 1.52 Microalbumin/Cr Ratio (11/20/17): 550.3  Imaging/Diagnostic Tests: XRayFoot Complete Right,01/10/2018 =No evidence of active osteitis  MR  Foot(7/29):2 skin ulcerations are seen on the foot without abscess, osteomyelitis or septic joint.   Results/Tests Pending at Time of Discharge:  none  Discharge Medications:  Allergies as of 01/13/2018      Reactions   Lactose Intolerance (gi) Diarrhea, Other (See Comments)   Bloating (also)   Other Other (See Comments)   Red meat causes stomach pains, bloating and diarrhea   Milk-related Compounds Diarrhea, Other (See Comments)   Any dairy products  - diarrhea and bloating      Medication List    STOP taking these medications   ibuprofen 200 MG tablet Commonly known as:  ADVIL,MOTRIN   nitroGLYCERIN 0.2 mg/hr patch Commonly known as:  NITRODUR - Dosed in mg/24 hr   pentoxifylline 400 MG CR tablet Commonly known as:  TRENTAL     TAKE these medications   acetaminophen 325 MG tablet Commonly known as:  TYLENOL Take 2 tablets (650 mg total) by mouth every 6 (six) hours as needed for mild pain, moderate pain or fever (or Fever >/= 101).   aspirin EC 81 MG tablet Take 1 tablet (81 mg total) by mouth daily.   atorvastatin 80 MG tablet Commonly known as:  LIPITOR Take 1 tablet (80 mg total) by mouth every morning.   carbamazepine 200 MG tablet Commonly known as:  TEGRETOL TAKE 1 TABLET BY MOUTH AT BEDTIME.   carvedilol 25 MG tablet Commonly known as:  COREG Take 1 tablet (25 mg total) by mouth 2 (two) times daily with a meal.   doxycycline 100 MG tablet Commonly known as:  VIBRA-TABS Take 1 tablet (100 mg total) by mouth every 12 (twelve) hours for 7 days.   FLUoxetine 40 MG capsule Commonly known as:  PROZAC Take 1 capsule (40 mg total) by mouth daily.   folic acid 1 MG tablet Commonly known as:  FOLVITE Take 1 tablet (1 mg total) by mouth daily.   gabapentin 300 MG capsule Commonly known as:  NEURONTIN Take 1 capsule (300 mg total) by mouth 2 (two) times daily.   glipiZIDE 10 MG 24 hr tablet Commonly known as:  GLUCOTROL XL Take 1 tablet (10 mg total) by mouth daily with breakfast.   glucose blood test strip Commonly known as:  ACCU-CHEK AVIVA PLUS Use as instructed   glucose blood test  strip Commonly known as:  TRUE METRIX BLOOD GLUCOSE TEST 1 each by Other route 3 (three) times daily.   Insulin Glargine 100 UNIT/ML Solostar Pen Commonly known as:  LANTUS SOLOSTAR Inject 35 Units into the skin 2 (two) times daily. What changed:    how much to take  when to take this   lisinopril 10 MG tablet Commonly known as:  PRINIVIL,ZESTRIL Take 1 tablet (10 mg total) by mouth daily. Start taking on:  01/14/2018 What changed:    medication strength  how much to take   pantoprazole 40 MG tablet Commonly known as:  PROTONIX Take 1 tablet (40 mg total) by mouth daily.   polyethylene glycol packet Commonly known as:  MIRALAX / GLYCOLAX Take 17 g by mouth daily as needed for moderate constipation. What changed:  additional instructions   senna-docusate 8.6-50 MG tablet Commonly known as:  Senokot-S Take 1 tablet by mouth at bedtime.   silver sulfADIAZINE 1 % cream Commonly known as:  SILVADENE Apply topically 2 (two) times daily. Apply to wound on top of your right foot. Cover with dry dressing.   sucralfate 1 g tablet Commonly known as:  CARAFATE TAKE 1 TABLET (1 G TOTAL) BY MOUTH 4 (FOUR) TIMES DAILY - WITH MEALS AND AT BEDTIME.   T.E.D. KNEE LENGTH/S-REGULAR Misc Wear when awake   tamsulosin 0.4 MG Caps capsule Commonly known as:  FLOMAX Take 1 capsule (0.4 mg total) by mouth daily. Start taking on:  01/14/2018   traZODone 100 MG tablet Commonly known as:  DESYREL Take 1 tablet (100 mg total) by mouth at bedtime.   TRUE METRIX METER Devi 1 each by Does not apply route 3 (three) times daily.   Vitamin D (Ergocalciferol) 50000 units Caps capsule Commonly known as:  DRISDOL Take 1 capsule (50,000 Units total) by mouth See admin instructions. Take one capsule (50000 units) by mouth on the 1st Sunday of each month       Discharge Instructions: Please refer to Patient Instructions section of EMR for full details.  Patient was counseled important signs and  symptoms that should prompt return to medical care, changes in medications, dietary instructions, activity restrictions, and follow up appointments.   Follow-Up Appointments: Follow-up Information    Charlott Rakes, MD. Schedule an appointment as soon as possible for a visit in 3 day(s).   Specialty:  Family Medicine Contact information: [place] [id number] [place], [place], PGY-1 01/13/2018 5:26 PM

## 2018-01-13 NOTE — Care Management Note (Signed)
Case Management Note  Patient Details  Name: Harpreet Pompey MRN: [id number] Date of Birth: 18-Jun-1970  Subjective/Objective:                    Action/Plan:   Expected Discharge Date:  01/13/18               Expected Discharge Plan:  [place]  In-House Referral:     Discharge planning Services  CM Consult, Medication Assistance, [facility] Program  Post Acute Care Choice:  Home Health Choice offered to:  Patient  DME Arranged:  N/A DME Agency:  NA  HH Arranged:  RN [employer] Agency:  [facility]  Status of Service:  Completed, signed off  If discussed at [place] of Stay Meetings, dates discussed:    Additional Comments:  Marilu Favre, RN 01/13/2018, 12:39 PM

## 2018-01-19 ENCOUNTER — Other Ambulatory Visit: Payer: Self-pay

## 2018-01-19 ENCOUNTER — Other Ambulatory Visit (INDEPENDENT_AMBULATORY_CARE_PROVIDER_SITE_OTHER): Payer: Self-pay | Admitting: Orthopedic Surgery

## 2018-01-19 ENCOUNTER — Ambulatory Visit (HOSPITAL_COMMUNITY): Payer: Self-pay | Admitting: Psychiatry

## 2018-01-19 ENCOUNTER — Encounter (HOSPITAL_COMMUNITY): Payer: Self-pay | Admitting: General Practice

## 2018-01-19 ENCOUNTER — Encounter (INDEPENDENT_AMBULATORY_CARE_PROVIDER_SITE_OTHER): Payer: Self-pay | Admitting: Orthopedic Surgery

## 2018-01-19 ENCOUNTER — Inpatient Hospital Stay (HOSPITAL_COMMUNITY)
Admission: AD | Admit: 2018-01-19 | Discharge: 2018-01-26 | DRG: 580 | Disposition: A | Discharge status: Home Health | Payer: Medicare HMO | Source: Ambulatory Visit | Attending: Orthopedic Surgery | Admitting: Orthopedic Surgery

## 2018-01-19 ENCOUNTER — Ambulatory Visit (INDEPENDENT_AMBULATORY_CARE_PROVIDER_SITE_OTHER): Payer: Medicare HMO | Admitting: Orthopedic Surgery

## 2018-01-19 VITALS — Ht 67.0 in | Wt 203.0 lb

## 2018-01-19 DIAGNOSIS — E11621 Type 2 diabetes mellitus with foot ulcer: Secondary | ICD-10-CM | POA: Diagnosis not present

## 2018-01-19 DIAGNOSIS — Z7982 Long term (current) use of aspirin: Secondary | ICD-10-CM | POA: Diagnosis not present

## 2018-01-19 DIAGNOSIS — E1122 Type 2 diabetes mellitus with diabetic chronic kidney disease: Secondary | ICD-10-CM | POA: Diagnosis present

## 2018-01-19 DIAGNOSIS — G4700 Insomnia, unspecified: Secondary | ICD-10-CM | POA: Diagnosis present

## 2018-01-19 DIAGNOSIS — L97411 Non-pressure chronic ulcer of right heel and midfoot limited to breakdown of skin: Secondary | ICD-10-CM

## 2018-01-19 DIAGNOSIS — E119 Type 2 diabetes mellitus without complications: Secondary | ICD-10-CM

## 2018-01-19 DIAGNOSIS — L02611 Cutaneous abscess of right foot: Principal | ICD-10-CM | POA: Diagnosis present

## 2018-01-19 DIAGNOSIS — Z79899 Other long term (current) drug therapy: Secondary | ICD-10-CM | POA: Diagnosis not present

## 2018-01-19 DIAGNOSIS — L97519 Non-pressure chronic ulcer of other part of right foot with unspecified severity: Secondary | ICD-10-CM | POA: Diagnosis not present

## 2018-01-19 DIAGNOSIS — Z794 Long term (current) use of insulin: Secondary | ICD-10-CM | POA: Diagnosis not present

## 2018-01-19 DIAGNOSIS — N179 Acute kidney failure, unspecified: Secondary | ICD-10-CM | POA: Diagnosis not present

## 2018-01-19 DIAGNOSIS — R0602 Shortness of breath: Secondary | ICD-10-CM | POA: Diagnosis not present

## 2018-01-19 DIAGNOSIS — L03115 Cellulitis of right lower limb: Secondary | ICD-10-CM | POA: Diagnosis not present

## 2018-01-19 DIAGNOSIS — F319 Bipolar disorder, unspecified: Secondary | ICD-10-CM | POA: Diagnosis present

## 2018-01-19 DIAGNOSIS — R0902 Hypoxemia: Secondary | ICD-10-CM | POA: Diagnosis not present

## 2018-01-19 DIAGNOSIS — E1142 Type 2 diabetes mellitus with diabetic polyneuropathy: Secondary | ICD-10-CM | POA: Diagnosis not present

## 2018-01-19 DIAGNOSIS — N183 Chronic kidney disease, stage 3 (moderate): Secondary | ICD-10-CM | POA: Diagnosis present

## 2018-01-19 DIAGNOSIS — N400 Enlarged prostate without lower urinary tract symptoms: Secondary | ICD-10-CM | POA: Diagnosis present

## 2018-01-19 DIAGNOSIS — I5032 Chronic diastolic (congestive) heart failure: Secondary | ICD-10-CM | POA: Diagnosis not present

## 2018-01-19 DIAGNOSIS — Z8673 Personal history of transient ischemic attack (TIA), and cerebral infarction without residual deficits: Secondary | ICD-10-CM | POA: Diagnosis not present

## 2018-01-19 DIAGNOSIS — L03116 Cellulitis of left lower limb: Secondary | ICD-10-CM | POA: Diagnosis not present

## 2018-01-19 DIAGNOSIS — E785 Hyperlipidemia, unspecified: Secondary | ICD-10-CM | POA: Diagnosis present

## 2018-01-19 DIAGNOSIS — I10 Essential (primary) hypertension: Secondary | ICD-10-CM | POA: Diagnosis not present

## 2018-01-19 DIAGNOSIS — I130 Hypertensive heart and chronic kidney disease with heart failure and stage 1 through stage 4 chronic kidney disease, or unspecified chronic kidney disease: Secondary | ICD-10-CM | POA: Diagnosis not present

## 2018-01-19 DIAGNOSIS — K219 Gastro-esophageal reflux disease without esophagitis: Secondary | ICD-10-CM | POA: Diagnosis present

## 2018-01-19 DIAGNOSIS — H409 Unspecified glaucoma: Secondary | ICD-10-CM | POA: Diagnosis present

## 2018-01-19 DIAGNOSIS — Z89432 Acquired absence of left foot: Secondary | ICD-10-CM

## 2018-01-19 DIAGNOSIS — A4901 Methicillin susceptible Staphylococcus aureus infection, unspecified site: Secondary | ICD-10-CM | POA: Diagnosis not present

## 2018-01-19 DIAGNOSIS — R079 Chest pain, unspecified: Secondary | ICD-10-CM | POA: Diagnosis not present

## 2018-01-19 DIAGNOSIS — L97503 Non-pressure chronic ulcer of other part of unspecified foot with necrosis of muscle: Secondary | ICD-10-CM | POA: Diagnosis not present

## 2018-01-19 DIAGNOSIS — R262 Difficulty in walking, not elsewhere classified: Secondary | ICD-10-CM | POA: Diagnosis not present

## 2018-01-19 LAB — CBC WITH DIFFERENTIAL/PLATELET
Abs Immature Granulocytes: 0 10*3/uL (ref 0.0–0.1)
BASOS ABS: 0.1 10*3/uL (ref 0.0–0.1)
Basophils Relative: 1 %
Eosinophils Absolute: 0.2 10*3/uL (ref 0.0–0.7)
Eosinophils Relative: 2 %
HCT: 35.3 % — ABNORMAL LOW (ref 39.0–52.0)
HEMOGLOBIN: 11.2 g/dL — AB (ref 13.0–17.0)
Immature Granulocytes: 0 %
LYMPHS PCT: 18 %
Lymphs Abs: 1.7 10*3/uL (ref 0.7–4.0)
MCH: 30.2 pg (ref 26.0–34.0)
MCHC: 31.7 g/dL (ref 30.0–36.0)
MCV: 95.1 fL (ref 78.0–100.0)
MONO ABS: 0.6 10*3/uL (ref 0.1–1.0)
MONOS PCT: 6 %
NEUTROS ABS: 6.5 10*3/uL (ref 1.7–7.7)
Neutrophils Relative %: 73 %
Platelets: 255 10*3/uL (ref 150–400)
RBC: 3.71 MIL/uL — ABNORMAL LOW (ref 4.22–5.81)
RDW: 12.7 % (ref 11.5–15.5)
WBC: 9 10*3/uL (ref 4.0–10.5)

## 2018-01-19 LAB — COMPREHENSIVE METABOLIC PANEL
ALBUMIN: 3.3 g/dL — AB (ref 3.5–5.0)
ALT: 14 U/L (ref 0–44)
ANION GAP: 8 (ref 5–15)
AST: 19 U/L (ref 15–41)
Alkaline Phosphatase: 60 U/L (ref 38–126)
BUN: 14 mg/dL (ref 6–20)
CO2: 30 mmol/L (ref 22–32)
Calcium: 8.9 mg/dL (ref 8.9–10.3)
Chloride: 104 mmol/L (ref 98–111)
Creatinine, Ser: 1.5 mg/dL — ABNORMAL HIGH (ref 0.61–1.24)
GFR calc Af Amer: >60 mL/min (ref >60)
GFR calc non Af Amer: 53 mL/min — ABNORMAL LOW (ref >60)
GLUCOSE: 131 mg/dL — AB (ref 70–99)
POTASSIUM: 4.1 mmol/L (ref 3.5–5.1)
SODIUM: 142 mmol/L (ref 135–145)
Total Bilirubin: 0.6 mg/dL (ref 0.3–1.2)
Total Protein: 6.7 g/dL (ref 6.5–8.1)

## 2018-01-19 LAB — GLUCOSE, CAPILLARY
GLUCOSE-CAPILLARY: 103 mg/dL — AB (ref 70–99)
GLUCOSE-CAPILLARY: 210 mg/dL — AB (ref 70–99)
Glucose-Capillary: 174 mg/dL — ABNORMAL HIGH (ref 70–99)

## 2018-01-19 MED ORDER — ASPIRIN 81 MG PO CHEW
81.0000 mg | CHEWABLE_TABLET | Freq: Two times a day (BID) | ORAL | Status: DC
  Administered 2018-01-19 – 2018-01-26 (×14): 81 mg via ORAL
  Filled 2018-01-19 (×14): qty 1

## 2018-01-19 MED ORDER — ONDANSETRON HCL 4 MG PO TABS: 4.0000 mg | ORAL_TABLET | Freq: Four times a day (QID) | ORAL | Status: DC | PRN

## 2018-01-19 MED ORDER — ATORVASTATIN CALCIUM 80 MG PO TABS
80.0000 mg | ORAL_TABLET | Freq: Every day | ORAL | Status: DC
  Administered 2018-01-19 – 2018-01-25 (×7): 80 mg via ORAL
  Filled 2018-01-19 (×7): qty 1

## 2018-01-19 MED ORDER — ONDANSETRON HCL 4 MG/2ML IJ SOLN
4.0000 mg | Freq: Four times a day (QID) | INTRAMUSCULAR | Status: DC | PRN
  Administered 2018-01-21: 4 mg via INTRAVENOUS
  Filled 2018-01-19: qty 2

## 2018-01-19 MED ORDER — HYDROCODONE-ACETAMINOPHEN 5-325 MG PO TABS
1.0000 | ORAL_TABLET | ORAL | Status: DC | PRN
  Administered 2018-01-19 – 2018-01-20 (×3): 1 via ORAL
  Administered 2018-01-20: 2 via ORAL
  Administered 2018-01-20: 1 via ORAL
  Administered 2018-01-20: 2 via ORAL
  Administered 2018-01-21: 1 via ORAL
  Administered 2018-01-21 – 2018-01-23 (×7): 2 via ORAL
  Administered 2018-01-24: 1 via ORAL
  Administered 2018-01-24 – 2018-01-26 (×5): 2 via ORAL
  Filled 2018-01-19: qty 1
  Filled 2018-01-19: qty 2
  Filled 2018-01-19: qty 1
  Filled 2018-01-19 (×5): qty 2
  Filled 2018-01-19: qty 1
  Filled 2018-01-19 (×5): qty 2
  Filled 2018-01-19 (×2): qty 1
  Filled 2018-01-19 (×4): qty 2

## 2018-01-19 MED ORDER — INSULIN ASPART 100 UNIT/ML ~~LOC~~ SOLN
0.0000 [IU] | Freq: Three times a day (TID) | SUBCUTANEOUS | Status: DC
  Administered 2018-01-19: 3 [IU] via SUBCUTANEOUS
  Administered 2018-01-20 – 2018-01-21 (×3): 2 [IU] via SUBCUTANEOUS
  Administered 2018-01-21 – 2018-01-22 (×4): 1 [IU] via SUBCUTANEOUS
  Administered 2018-01-23: 3 [IU] via SUBCUTANEOUS
  Administered 2018-01-23: 1 [IU] via SUBCUTANEOUS
  Administered 2018-01-24 (×2): 2 [IU] via SUBCUTANEOUS
  Administered 2018-01-25 (×2): 1 [IU] via SUBCUTANEOUS
  Administered 2018-01-25: 3 [IU] via SUBCUTANEOUS
  Administered 2018-01-26: 2 [IU] via SUBCUTANEOUS
  Administered 2018-01-26: 1 [IU] via SUBCUTANEOUS

## 2018-01-19 MED ORDER — SODIUM CHLORIDE 0.9 % IV SOLN
INTRAVENOUS | Status: DC | PRN
  Administered 2018-01-19: 250 mL via INTRAVENOUS

## 2018-01-19 MED ORDER — POTASSIUM CHLORIDE IN NACL 20-0.9 MEQ/L-% IV SOLN
INTRAVENOUS | Status: DC
  Administered 2018-01-19: 13:00:00 via INTRAVENOUS
  Administered 2018-01-21: 50 mL/h via INTRAVENOUS
  Administered 2018-01-22: 08:00:00 via INTRAVENOUS
  Filled 2018-01-19 (×4): qty 1000

## 2018-01-19 MED ORDER — PANTOPRAZOLE SODIUM 40 MG PO TBEC
40.0000 mg | DELAYED_RELEASE_TABLET | Freq: Every day | ORAL | Status: DC
  Administered 2018-01-19 – 2018-01-26 (×8): 40 mg via ORAL
  Filled 2018-01-19 (×8): qty 1

## 2018-01-19 MED ORDER — SUCRALFATE 1 G PO TABS
1.0000 g | ORAL_TABLET | Freq: Three times a day (TID) | ORAL | Status: DC
  Administered 2018-01-19 – 2018-01-26 (×25): 1 g via ORAL
  Filled 2018-01-19 (×25): qty 1

## 2018-01-19 MED ORDER — MAGNESIUM CITRATE PO SOLN: 1.0000 | Freq: Once | ORAL | Status: DC | PRN

## 2018-01-19 MED ORDER — VANCOMYCIN HCL IN DEXTROSE 1-5 GM/200ML-% IV SOLN: 1000.0000 mg | Freq: Two times a day (BID) | INTRAVENOUS | Status: DC

## 2018-01-19 MED ORDER — FLUOXETINE HCL 20 MG PO CAPS
40.0000 mg | ORAL_CAPSULE | Freq: Every day | ORAL | Status: DC
  Administered 2018-01-19 – 2018-01-26 (×8): 40 mg via ORAL
  Filled 2018-01-19 (×8): qty 2

## 2018-01-19 MED ORDER — VANCOMYCIN HCL 10 G IV SOLR
1250.0000 mg | Freq: Two times a day (BID) | INTRAVENOUS | Status: DC
  Administered 2018-01-20 – 2018-01-23 (×6): 1250 mg via INTRAVENOUS
  Filled 2018-01-19 (×8): qty 1250

## 2018-01-19 MED ORDER — CARBAMAZEPINE 200 MG PO TABS
200.0000 mg | ORAL_TABLET | Freq: Every day | ORAL | Status: DC
  Administered 2018-01-19 – 2018-01-25 (×7): 200 mg via ORAL
  Filled 2018-01-19 (×8): qty 1

## 2018-01-19 MED ORDER — DOCUSATE SODIUM 100 MG PO CAPS
100.0000 mg | ORAL_CAPSULE | Freq: Two times a day (BID) | ORAL | Status: DC
  Administered 2018-01-19: 100 mg via ORAL
  Filled 2018-01-19 (×3): qty 1

## 2018-01-19 MED ORDER — ZOLPIDEM TARTRATE 5 MG PO TABS
5.0000 mg | ORAL_TABLET | Freq: Every evening | ORAL | Status: DC | PRN
  Administered 2018-01-25 (×2): 5 mg via ORAL
  Filled 2018-01-19 (×2): qty 1

## 2018-01-19 MED ORDER — PIPERACILLIN-TAZOBACTAM 3.375 G IVPB 30 MIN
3.3750 g | Freq: Once | INTRAVENOUS | Status: AC
  Administered 2018-01-19: 3.375 g via INTRAVENOUS
  Filled 2018-01-19: qty 50

## 2018-01-19 MED ORDER — TRUE METRIX METER DEVI: 1.0000 | Freq: Three times a day (TID) | Status: DC

## 2018-01-19 MED ORDER — VANCOMYCIN HCL 10 G IV SOLR
1750.0000 mg | Freq: Once | INTRAVENOUS | Status: AC
  Administered 2018-01-19: 1750 mg via INTRAVENOUS
  Filled 2018-01-19: qty 1750

## 2018-01-19 MED ORDER — VITAMIN D (ERGOCALCIFEROL) 1.25 MG (50000 UNIT) PO CAPS: 50000.0000 [IU] | ORAL_CAPSULE | ORAL | Status: DC

## 2018-01-19 MED ORDER — GABAPENTIN 300 MG PO CAPS
300.0000 mg | ORAL_CAPSULE | Freq: Two times a day (BID) | ORAL | Status: DC
  Administered 2018-01-19 – 2018-01-26 (×15): 300 mg via ORAL
  Filled 2018-01-19 (×15): qty 1

## 2018-01-19 MED ORDER — CARVEDILOL 25 MG PO TABS
25.0000 mg | ORAL_TABLET | Freq: Two times a day (BID) | ORAL | Status: DC
  Administered 2018-01-19 – 2018-01-26 (×14): 25 mg via ORAL
  Filled 2018-01-19 (×14): qty 1

## 2018-01-19 MED ORDER — FOLIC ACID 1 MG PO TABS
1.0000 mg | ORAL_TABLET | Freq: Every day | ORAL | Status: DC
  Administered 2018-01-19 – 2018-01-26 (×8): 1 mg via ORAL
  Filled 2018-01-19 (×8): qty 1

## 2018-01-19 MED ORDER — PIPERACILLIN-TAZOBACTAM 3.375 G IVPB
3.3750 g | Freq: Three times a day (TID) | INTRAVENOUS | Status: DC
  Administered 2018-01-19 – 2018-01-23 (×11): 3.375 g via INTRAVENOUS
  Filled 2018-01-19 (×13): qty 50

## 2018-01-19 MED ORDER — POLYETHYLENE GLYCOL 3350 17 G PO PACK
17.0000 g | PACK | Freq: Every day | ORAL | Status: DC | PRN
Start: 2018-01-19 — End: 2018-01-21

## 2018-01-19 MED ORDER — LISINOPRIL 10 MG PO TABS
10.0000 mg | ORAL_TABLET | Freq: Every day | ORAL | Status: DC
  Administered 2018-01-19 – 2018-01-24 (×6): 10 mg via ORAL
  Filled 2018-01-19 (×6): qty 1

## 2018-01-19 MED ORDER — BISACODYL 5 MG PO TBEC
5.0000 mg | DELAYED_RELEASE_TABLET | Freq: Every day | ORAL | Status: DC | PRN
  Administered 2018-01-23: 5 mg via ORAL
  Filled 2018-01-19: qty 1

## 2018-01-19 MED ORDER — GLIPIZIDE ER 10 MG PO TB24
10.0000 mg | ORAL_TABLET | Freq: Every day | ORAL | Status: DC
  Administered 2018-01-20 – 2018-01-26 (×7): 10 mg via ORAL
  Filled 2018-01-19 (×7): qty 1

## 2018-01-19 MED ORDER — TRAZODONE HCL 100 MG PO TABS
100.0000 mg | ORAL_TABLET | Freq: Every day | ORAL | Status: DC
  Administered 2018-01-19 – 2018-01-25 (×7): 100 mg via ORAL
  Filled 2018-01-19 (×7): qty 1

## 2018-01-19 MED ORDER — TAMSULOSIN HCL 0.4 MG PO CAPS
0.4000 mg | ORAL_CAPSULE | Freq: Every day | ORAL | Status: DC
  Administered 2018-01-19 – 2018-01-26 (×8): 0.4 mg via ORAL
  Filled 2018-01-19 (×8): qty 1

## 2018-01-19 NOTE — Progress Notes (Signed)
Pharmacy Antibiotic Note  Shane [name] is a 47 y.o. male admitted on 01/19/2018 with cellulitis, concern with osteomyelitis.  Patient has increased swelling/erythema for the past 3 weeks.  He did not respond to PTA amoxicillin.  Pharmacy has been consulted for vancomycin and Zosyn dosing.  SCr 1.5, CrCL 65 ml/min, afebrile, WBC WNL.   Plan: Vanc 1750mg  IV x 1, then 1250mg  IV Q12H (goal trough 15-20 mcg/mL until osteo ruled out) Zosyn EID 3.375gm IV Q8H Monitor renal fxn, clinical progress, vanc trough as Css     No data recorded.  Recent Labs  Lab 01/19/18 1113  WBC 9.0  CREATININE 1.50*    Estimated Creatinine Clearance: 65.2 mL/min (A) (by C-G formula based on SCr of 1.5 mg/dL (H)).    Allergies  Allergen Reactions  . Lactose Intolerance (Gi) Diarrhea and Other (See Comments)    Bloating (also)  . Other Other (See Comments)    Red meat causes stomach pains, bloating and diarrhea  . Milk-Related Compounds Diarrhea and Other (See Comments)    Any dairy products  - diarrhea and bloating     Vanc 8/5 >> Zosyn 8/5 >> Amox PTA   Lior Hoen D. Mina Marble, PharmD, BCPS, [place] 01/19/2018, 12:58 PM

## 2018-01-20 DIAGNOSIS — L02611 Cutaneous abscess of right foot: Principal | ICD-10-CM

## 2018-01-20 LAB — GLUCOSE, CAPILLARY
GLUCOSE-CAPILLARY: 177 mg/dL — AB (ref 70–99)
Glucose-Capillary: 189 mg/dL — ABNORMAL HIGH (ref 70–99)
Glucose-Capillary: 118 mg/dL — ABNORMAL HIGH (ref 70–99)
Glucose-Capillary: 106 mg/dL — ABNORMAL HIGH (ref 70–99)

## 2018-01-20 NOTE — H&P (Signed)
Shane [name] is an 47 y.o. male.   Chief Complaint: Cellulitis ulceration right foot. HPI: Patient is a 47 year old gentleman with diabetic insensate neuropathy status post an MRI scan of the right foot.  Patient has had a irrigation and debridement of the right foot several years ago.  Patient was in the emergency room on 01/10/2018 with cellulitis.  Patient states he is developed acute ulcers dorsally and medially on the right foot.  He states that this started about 2 weeks ago started taking some antibiotics that he had at home he states that the infection spread and then he went to the emergency room.  Past Medical History:  Diagnosis Date  . Anemia   . Bipolar disorder ([place])   . Cellulitis 05/2017   RIGHT FOOT   DIABETIC ULCER   . Cellulitis and abscess of left leg 06/2016  . Chest pain    a. 2015 Reportedly normal stress test in [place].  Marland Kitchen Chronic diastolic CHF (congestive heart failure) (H[facility])    a.03/2015 Echo: EF 55-60%, Gr 1 DD, mild MR, triv PR.  . Depression   . Depression with anxiety   . Dyspnea    with exertion  . GERD (gastroesophageal reflux disease)   . Glaucoma   . Hyperlipidemia   . Hypertension    a. 08/2014 Admitted with hypertensive urgency.  . Insomnia   . Internal carotid artery stenosis   . Lower GI bleed 07/04/2015  . Neuropathy   . Refusal of blood transfusions as patient is Jehovah's Witness   . TIA (transient ischemic attack) 08/2014; 03/2015   a. 08/2014 in setting of hypertensive urgency.  . Type II diabetes mellitus ([place])    started insulin spring 2016, Type II  . Vitamin D deficiency spring 2016    Past Surgical History:  Procedure Laterality Date  . AMPUTATION Left 08/03/2015   Procedure: AMPUTATION LEFT GREAT TOE;  Surgeon: Newt Minion, MD;  Location: [place];  Service: Orthopedics;  Laterality: Left;  . AMPUTATION Left 12/02/2015   Procedure: amputation of left 2nd digit  . AMPUTATION Left 04/10/2016   Procedure: Left 2nd Toe Amputation at MTP  Joint;  Surgeon: Newt Minion, MD;  Location: [facility];  Service: Orthopedics;  Laterality: Left;  . AMPUTATION Left 06/09/2016   Procedure: AMPUTATION LEFT THIRD TOE;  Surgeon: [name], MD;  Location: [place];  Service: Orthopedics;  Laterality: Left;  . AMPUTATION Left 06/26/2016   Procedure: Left Foot Transmetatarsal Amputation;  Surgeon: Newt Minion, MD;  Location: [facility] [facility];  Service: Orthopedics;  Laterality: Left;  . APPLICATION OF WOUND VAC Left 12/19/2015   Procedure: APPLICATION OF WOUND VAC;  Surgeon: Meredith Pel, MD;  Location: [facility];  Service: Orthopedics;  Laterality: Left;  . CIRCUMCISION    . COLONOSCOPY N/A 01/22/2016   Procedure: COLONOSCOPY;  Surgeon: Gatha Mayer, MD;  Location: [place];  Service: Endoscopy;  Laterality: N/A;  . ESOPHAGOGASTRODUODENOSCOPY N/A 01/19/2016   Procedure: ESOPHAGOGASTRODUODENOSCOPY (EGD);  Surgeon: Manus Gunning, MD;  Location: [facility];  Service: Gastroenterology;  Laterality: N/A;  . ESOPHAGOGASTRODUODENOSCOPY N/A 01/22/2016   Procedure: ESOPHAGOGASTRODUODENOSCOPY (EGD);  Surgeon: Gatha Mayer, MD;  Location[facility];  Service: Endoscopy;  Laterality: N/A;  . I&D EXTREMITY Left 12/02/2015   Procedure: IRRIGATION AND DEBRIDEMENT OF FOOT; LEFT SECOND TOE AMPUTATION;  Surgeon: Meredith Pel, MD;  Location: [place];  Service: Orthopedics;  Laterality: Left;  . I&D EXTREMITY Left 12/19/2015   Procedure: I &  D LEFT FOOT WITH BEADS ;  Surgeon: Meredith Pel, MD;  Location: [place];  Service: Orthopedics;  Laterality: Left;  . I&D EXTREMITY Right 01/17/2016   Procedure: IRRIGATION AND DEBRIDEMENT RIGHT FOOT;  Surgeon: Newt Minion, MD;  Location: [place];  Service: Orthopedics;  Laterality: Right;  . INCISION AND DRAINAGE FOOT Right 01/17/2016  . INGUINAL HERNIA REPAIR Bilateral ~ 1983- ~ 1986  . TONSILLECTOMY  ~ 1    Family History  Problem Relation Age of Onset  . Hypertension Mother   . Diabetes Mother   .  Hyperlipidemia Mother   . Heart disease Mother        s/p pacemaker  . Diabetes Father   . Hypertension Father   . Stroke Father   . Heart attack Father        first MI @ 25.  . Depression Father   . Dementia Father   . Stroke Brother   . ADD / ADHD Brother   . Anxiety disorder Brother   . Bipolar disorder Brother   . OCD Brother   . Sexual abuse Brother    Social History:  reports that he has never smoked. He has never used smokeless tobacco. He reports that he does not drink alcohol or use drugs.  Allergies:  Allergies  Allergen Reactions  . Other Diarrhea and Other (See Comments)    Red meat causes stomach pains, bloating and diarrhea  . Lactose Intolerance (Gi) Diarrhea and Other (See Comments)    Bloating   . Milk-Related Compounds Diarrhea and Other (See Comments)    Any dairy products  > lactose intolerance diarrhea and bloating    Medications Prior to Admission  Medication Sig Dispense Refill  . acetaminophen (TYLENOL) 325 MG tablet Take 2 tablets (650 mg total) by mouth every 6 (six) hours as needed for mild pain, moderate pain or fever (or Fever >/= 101).    Marland Kitchen aspirin EC 81 MG tablet Take 1 tablet (81 mg total) by mouth daily. 30 tablet 11  . atorvastatin (LIPITOR) 80 MG tablet Take 1 tablet (80 mg total) by mouth every morning. 30 tablet 5  . carbamazepine (TEGRETOL) 200 MG tablet TAKE 1 TABLET BY MOUTH AT BEDTIME. 90 tablet 4  . carvedilol (COREG) 25 MG tablet Take 1 tablet (25 mg total) by mouth 2 (two) times daily with a meal. 60 tablet 6  . doxycycline (VIBRA-TABS) 100 MG tablet Take 1 tablet (100 mg total) by mouth every 12 (twelve) hours for 7 days. 14 tablet 0  . FLUoxetine (PROZAC) 40 MG capsule Take 1 capsule (40 mg total) by mouth daily. 90 capsule 1  . folic acid (FOLVITE) 1 MG tablet Take 1 tablet (1 mg total) by mouth daily. 30 tablet 3  . gabapentin (NEURONTIN) 300 MG capsule Take 1 capsule (300 mg total) by mouth 2 (two) times daily. 60 capsule 6  .  glipiZIDE (GLUCOTROL XL) 10 MG 24 hr tablet Take 1 tablet (10 mg total) by mouth daily with breakfast. 30 tablet 6  . Insulin Glargine (LANTUS SOLOSTAR) 100 UNIT/ML Solostar Pen Inject 35 Units into the skin 2 (two) times daily. (Patient taking differently: Inject 40 Units into the skin daily. ) 30 mL 6  . lisinopril (PRINIVIL,ZESTRIL) 10 MG tablet Take 1 tablet (10 mg total) by mouth daily. 30 tablet 0  . pantoprazole (PROTONIX) 40 MG tablet Take 1 tablet (40 mg total) by mouth daily. 30 tablet 3  . polyethylene glycol (MIRALAX /  GLYCOLAX) packet Take 17 g by mouth daily as needed for moderate constipation. (Patient taking differently: Take 17 g by mouth daily as needed for moderate constipation. Mix in 8 oz liquid and drink) 14 each 0  . senna-docusate (SENOKOT-S) 8.6-50 MG tablet Take 1 tablet by mouth at bedtime. (Patient taking differently: Take 1 tablet by mouth at bedtime as needed for mild constipation. ) 60 tablet 2  . silver sulfADIAZINE (SILVADENE) 1 % cream Apply topically 2 (two) times daily. Apply to wound on top of your right foot. Cover with dry dressing. 50 g 0  . sucralfate (CARAFATE) 1 g tablet TAKE 1 TABLET (1 G TOTAL) BY MOUTH 4 (FOUR) TIMES DAILY - WITH MEALS AND AT BEDTIME. 120 tablet 0  . tamsulosin (FLOMAX) 0.4 MG CAPS capsule Take 1 capsule (0.4 mg total) by mouth daily. 30 capsule 0  . traZODone (DESYREL) 100 MG tablet Take 1 tablet (100 mg total) by mouth at bedtime.    . Vitamin D, Ergocalciferol, (DRISDOL) 50000 units CAPS capsule Take 1 capsule (50,000 Units total) by mouth See admin instructions. Take one capsule (50000 units) by mouth on the 1st Sunday of each month    . Blood Glucose Monitoring Suppl (TRUE METRIX METER) DEVI 1 each by Does not apply route 3 (three) times daily. 1 Device 0  . Elastic Bandages & Supports (T.E.D. KNEE LENGTH/S-REGULAR) MISC Wear when awake 1 each 0  . glucose blood (ACCU-CHEK AVIVA PLUS) test strip Use as instructed 100 each 12  . glucose  blood (TRUE METRIX BLOOD GLUCOSE TEST) test strip 1 each by Other route 3 (three) times daily. 100 each 12    Results for orders placed or performed during the hospital encounter of 01/19/18 (from the past 48 hour(s))  Comprehensive metabolic panel     Status: Abnormal   Collection Time: 01/19/18 11:13 AM  Result Value Ref Range   Sodium 142 135 - 145 mmol/L   Potassium 4.1 3.5 - 5.1 mmol/L   Chloride 104 98 - 111 mmol/L   CO2 30 22 - 32 mmol/L   Glucose, Bld 131 (H) 70 - 99 mg/dL   BUN 14 6 - 20 mg/dL   Creatinine, Ser 1.50 (H) 0.61 - 1.24 mg/dL   Calcium 8.9 8.9 - 10.3 mg/dL   Total Protein 6.7 6.5 - 8.1 g/dL   Albumin 3.3 (L) 3.5 - 5.0 g/dL   AST 19 15 - 41 U/L   ALT 14 0 - 44 U/L   Alkaline Phosphatase 60 38 - 126 U/L   Total Bilirubin 0.6 0.3 - 1.2 mg/dL   GFR calc non Af Amer 53 (L) >60 mL/min   GFR calc Af Amer >60 >60 mL/min    Comment: (NOTE) The eGFR has been calculated using the CKD EPI equation. This calculation has not been validated in all clinical situations. eGFR's persistently <60 mL/min signify possible Chronic Kidney Disease.    Anion gap 8 5 - 15    Comment: Performed at [facility] [address] [place], [place] [address]  CBC WITH DIFFERENTIAL     Status: Abnormal   Collection Time: 01/19/18 11:13 AM  Result Value Ref Range   WBC 9.0 4.0 - 10.5 K/uL   RBC 3.71 (L) 4.22 - 5.81 MIL/uL   Hemoglobin 11.2 (L) 13.0 - 17.0 g/dL   HCT 35.3 (L) 39.0 - 52.0 %   MCV 95.1 78.0 - 100.0 fL   MCH 30.2 26.0 - 34.0 pg  MCHC 31.7 30.0 - 36.0 g/dL   RDW 12.7 11.5 - 15.5 %   Platelets 255 150 - 400 K/uL   Neutrophils Relative % 73 %   Neutro Abs 6.5 1.7 - 7.7 K/uL   Lymphocytes Relative 18 %   Lymphs Abs 1.7 0.7 - 4.0 K/uL   Monocytes Relative 6 %   Monocytes Absolute 0.6 0.1 - 1.0 K/uL   Eosinophils Relative 2 %   Eosinophils Absolute 0.2 0.0 - 0.7 K/uL   Basophils Relative 1 %   Basophils Absolute 0.1 0.0 - 0.1 K/uL   Immature Granulocytes 0 %    Abs Immature Granulocytes 0.0 0.0 - 0.1 K/uL    Comment: Performed at [facility] [address], [place], [place] [address]  Glucose, capillary     Status: Abnormal   Collection Time: 01/19/18 12:18 PM  Result Value Ref Range   Glucose-Capillary 103 (H) 70 - 99 mg/dL  Glucose, capillary     Status: Abnormal   Collection Time: 01/19/18  4:28 PM  Result Value Ref Range   Glucose-Capillary 210 (H) 70 - 99 mg/dL  Glucose, capillary     Status: Abnormal   Collection Time: 01/19/18  9:17 PM  Result Value Ref Range   Glucose-Capillary 174 (H) 70 - 99 mg/dL  Glucose, capillary     Status: Abnormal   Collection Time: 01/20/18  6:25 AM  Result Value Ref Range   Glucose-Capillary 189 (H) 70 - 99 mg/dL  Glucose, capillary     Status: Abnormal   Collection Time: 01/20/18 11:59 AM  Result Value Ref Range   Glucose-Capillary 118 (H) 70 - 99 mg/dL   No results found.  Review of Systems  All other systems reviewed and are negative.   Blood pressure (!) 183/95, pulse 77, temperature 97.9 F (36.6 C), temperature source Oral, resp. rate 16, height 5' 7"  (1.702 m), weight 202 lb 13.2 oz (92 kg), SpO2 97 %. Physical Exam  On examination patient is alert oriented no adenopathy well-dressed normal affect normal respiratory effort he has an antalgic gait patient has no fever or chills.  Examination of the right foot he has maceration swelling with an ulcer dorsally and medially over the forefoot.  Patient is currently on a kneeling scooter he has a good dorsalis pedis pulse he has been taking Augmentin.  There is no ascending cellulitis no purulent drainage.  The MRI scan shows no definite abscess or osteomyelitis. Assessment/Plan Assessment: Diabetic insensate neuropathy with acute ulceration right foot with cellulitis failed treatment with Augmentin.  Plan: We will admit the patient start vancomycin and Zosyn and medication for the diabetes plan for surgery on Wednesday with irrigation and  debridement of the foot.  Anticipate an incisional wound VAC postoperatively.  Risks and benefits were discussed including need for additional surgery.  Patient states he understands wished to proceed at this time.  Newt Minion, MD 01/20/2018, 1:08 PM

## 2018-01-20 NOTE — H&P (View-Only) (Signed)
Patient ID: Shane [name], male   DOB: Mar 28, 1971, 47 y.o.   MRN: [id number] Patient without complaints today.  On IV antibiotics infection.  Patient had acute bleeding today the dressing is clean and dry at this time.  Plan for irrigation and debridement of the foot infection tomorrow Wednesday.

## 2018-01-20 NOTE — Progress Notes (Signed)
Pt called this RN to room stated " I'm bleeding"  This RN notes pt's right foot to be bleeding through dressing.  Dressing removed, this nurse notes blood to be squirting/spraying from a pin hole area in the wound bed.  This RN cleaned wound with saline, applied pressure dressing.  Agricultural consultant at bedside as well.  Call placed to notify MD.  Lavone Nian

## 2018-01-20 NOTE — Progress Notes (Signed)
Patient ID: Shane [name], male   DOB: September 06, 1970, 47 y.o.   MRN: [id number] Patient without complaints today.  On IV antibiotics infection.  Patient had acute bleeding today the dressing is clean and dry at this time.  Plan for irrigation and debridement of the foot infection tomorrow Wednesday.

## 2018-01-21 ENCOUNTER — Inpatient Hospital Stay (HOSPITAL_COMMUNITY): Payer: Medicare HMO

## 2018-01-21 ENCOUNTER — Encounter (HOSPITAL_COMMUNITY)
Admission: AD | Disposition: A | Discharge status: Home Health | Payer: Self-pay | Source: Ambulatory Visit | Attending: Orthopedic Surgery

## 2018-01-21 ENCOUNTER — Encounter (HOSPITAL_COMMUNITY): Payer: Self-pay | Admitting: *Deleted

## 2018-01-21 ENCOUNTER — Ambulatory Visit (HOSPITAL_COMMUNITY): Payer: Self-pay | Admitting: Psychiatry

## 2018-01-21 DIAGNOSIS — L97411 Non-pressure chronic ulcer of right heel and midfoot limited to breakdown of skin: Secondary | ICD-10-CM

## 2018-01-21 HISTORY — PX: I & D EXTREMITY: SHX5045

## 2018-01-21 LAB — BASIC METABOLIC PANEL
Anion gap: 7 (ref 5–15)
BUN: 21 mg/dL — AB (ref 6–20)
CO2: 31 mmol/L (ref 22–32)
CREATININE: 1.55 mg/dL — AB (ref 0.61–1.24)
Calcium: 8.5 mg/dL — ABNORMAL LOW (ref 8.9–10.3)
Chloride: 103 mmol/L (ref 98–111)
GFR calc Af Amer: 60 mL/min — ABNORMAL LOW (ref >60)
GFR calc non Af Amer: 51 mL/min — ABNORMAL LOW (ref >60)
GLUCOSE: 137 mg/dL — AB (ref 70–99)
POTASSIUM: 4.2 mmol/L (ref 3.5–5.1)
SODIUM: 141 mmol/L (ref 135–145)

## 2018-01-21 LAB — GLUCOSE, CAPILLARY
GLUCOSE-CAPILLARY: 117 mg/dL — AB (ref 70–99)
GLUCOSE-CAPILLARY: 143 mg/dL — AB (ref 70–99)
Glucose-Capillary: 145 mg/dL — ABNORMAL HIGH (ref 70–99)
Glucose-Capillary: 135 mg/dL — ABNORMAL HIGH (ref 70–99)
Glucose-Capillary: 197 mg/dL — ABNORMAL HIGH (ref 70–99)

## 2018-01-21 LAB — SURGICAL PCR SCREEN
MRSA, PCR: NEGATIVE
Staphylococcus aureus: POSITIVE — AB

## 2018-01-21 SURGERY — IRRIGATION AND DEBRIDEMENT EXTREMITY
Anesthesia: General | Laterality: Right

## 2018-01-21 MED ORDER — ONDANSETRON HCL 4 MG PO TABS: 4.0000 mg | ORAL_TABLET | Freq: Four times a day (QID) | ORAL | Status: DC | PRN

## 2018-01-21 MED ORDER — POLYETHYLENE GLYCOL 3350 17 G PO PACK: 17.0000 g | PACK | Freq: Every day | ORAL | Status: DC | PRN

## 2018-01-21 MED ORDER — SODIUM CHLORIDE 0.9 % IV SOLN
INTRAVENOUS | Status: DC
  Administered 2018-01-21 – 2018-01-23 (×2): 10 mL/h via INTRAVENOUS

## 2018-01-21 MED ORDER — CEFAZOLIN SODIUM-DEXTROSE 2-4 GM/100ML-% IV SOLN
2.0000 g | INTRAVENOUS | Status: AC
  Administered 2018-01-21: 2 g via INTRAVENOUS
  Filled 2018-01-21 (×2): qty 100

## 2018-01-21 MED ORDER — DEXAMETHASONE SODIUM PHOSPHATE 10 MG/ML IJ SOLN
INTRAMUSCULAR | Status: DC | PRN
  Administered 2018-01-21: 5 mg via INTRAVENOUS

## 2018-01-21 MED ORDER — DOCUSATE SODIUM 100 MG PO CAPS
100.0000 mg | ORAL_CAPSULE | Freq: Two times a day (BID) | ORAL | Status: DC
  Administered 2018-01-22 – 2018-01-26 (×7): 100 mg via ORAL
  Filled 2018-01-21 (×9): qty 1

## 2018-01-21 MED ORDER — METOCLOPRAMIDE HCL 5 MG/ML IJ SOLN: 5.0000 mg | Freq: Three times a day (TID) | INTRAMUSCULAR | Status: DC | PRN

## 2018-01-21 MED ORDER — BISACODYL 10 MG RE SUPP
10.0000 mg | Freq: Every day | RECTAL | Status: DC | PRN
  Filled 2018-01-21: qty 1

## 2018-01-21 MED ORDER — LACTATED RINGERS IV SOLN
INTRAVENOUS | Status: DC
  Administered 2018-01-21: 12:00:00 via INTRAVENOUS

## 2018-01-21 MED ORDER — DEXAMETHASONE SODIUM PHOSPHATE 10 MG/ML IJ SOLN
INTRAMUSCULAR | Status: AC
  Filled 2018-01-21: qty 3

## 2018-01-21 MED ORDER — HYDRALAZINE HCL 20 MG/ML IJ SOLN
INTRAMUSCULAR | Status: AC
  Filled 2018-01-21: qty 1

## 2018-01-21 MED ORDER — OXYCODONE HCL 5 MG PO TABS
5.0000 mg | ORAL_TABLET | Freq: Once | ORAL | Status: AC | PRN
  Administered 2018-01-21: 5 mg via ORAL

## 2018-01-21 MED ORDER — ONDANSETRON HCL 4 MG/2ML IJ SOLN: 4.0000 mg | Freq: Four times a day (QID) | INTRAMUSCULAR | Status: DC | PRN

## 2018-01-21 MED ORDER — MAGNESIUM CITRATE PO SOLN: 1.0000 | Freq: Once | ORAL | Status: DC | PRN

## 2018-01-21 MED ORDER — HYDROMORPHONE HCL 1 MG/ML IJ SOLN: 0.5000 mg | INTRAMUSCULAR | Status: DC | PRN

## 2018-01-21 MED ORDER — OXYCODONE HCL 5 MG/5ML PO SOLN: 5.0000 mg | Freq: Once | ORAL | Status: AC | PRN

## 2018-01-21 MED ORDER — METOCLOPRAMIDE HCL 5 MG PO TABS: 5.0000 mg | ORAL_TABLET | Freq: Three times a day (TID) | ORAL | Status: DC | PRN

## 2018-01-21 MED ORDER — OXYCODONE HCL 5 MG PO TABS
ORAL_TABLET | ORAL | Status: AC
  Filled 2018-01-21: qty 1

## 2018-01-21 MED ORDER — 0.9 % SODIUM CHLORIDE (POUR BTL) OPTIME
TOPICAL | Status: DC | PRN
  Administered 2018-01-21: 1000 mL

## 2018-01-21 MED ORDER — HYDRALAZINE HCL 20 MG/ML IJ SOLN
10.0000 mg | Freq: Once | INTRAMUSCULAR | Status: AC
  Administered 2018-01-21: 10 mg via INTRAVENOUS

## 2018-01-21 MED ORDER — METHOCARBAMOL 1000 MG/10ML IJ SOLN
500.0000 mg | Freq: Four times a day (QID) | INTRAVENOUS | Status: DC | PRN
  Filled 2018-01-21: qty 5

## 2018-01-21 MED ORDER — PHENYLEPHRINE 40 MCG/ML (10ML) SYRINGE FOR IV PUSH (FOR BLOOD PRESSURE SUPPORT)
PREFILLED_SYRINGE | INTRAVENOUS | Status: DC | PRN
  Administered 2018-01-21: 160 ug via INTRAVENOUS
  Administered 2018-01-21: 40 ug via INTRAVENOUS
  Administered 2018-01-21 (×2): 80 ug via INTRAVENOUS

## 2018-01-21 MED ORDER — MIDAZOLAM HCL 2 MG/2ML IJ SOLN
INTRAMUSCULAR | Status: AC
  Filled 2018-01-21: qty 2

## 2018-01-21 MED ORDER — FENTANYL CITRATE (PF) 100 MCG/2ML IJ SOLN: 25.0000 ug | INTRAMUSCULAR | Status: DC | PRN

## 2018-01-21 MED ORDER — OXYCODONE HCL 5 MG PO TABS: 5.0000 mg | ORAL_TABLET | ORAL | Status: DC | PRN

## 2018-01-21 MED ORDER — ONDANSETRON HCL 4 MG/2ML IJ SOLN
INTRAMUSCULAR | Status: DC | PRN
  Administered 2018-01-21: 4 mg via INTRAVENOUS

## 2018-01-21 MED ORDER — LIDOCAINE 2% (20 MG/ML) 5 ML SYRINGE
INTRAMUSCULAR | Status: DC | PRN
  Administered 2018-01-21: 60 mg via INTRAVENOUS

## 2018-01-21 MED ORDER — ACETAMINOPHEN 325 MG PO TABS
325.0000 mg | ORAL_TABLET | Freq: Four times a day (QID) | ORAL | Status: DC | PRN
  Administered 2018-01-22: 325 mg via ORAL
  Administered 2018-01-23: 650 mg via ORAL
  Filled 2018-01-21: qty 2

## 2018-01-21 MED ORDER — LIDOCAINE 2% (20 MG/ML) 5 ML SYRINGE
INTRAMUSCULAR | Status: AC
  Filled 2018-01-21: qty 5

## 2018-01-21 MED ORDER — ONDANSETRON HCL 4 MG/2ML IJ SOLN
INTRAMUSCULAR | Status: AC
  Filled 2018-01-21: qty 6

## 2018-01-21 MED ORDER — MUPIROCIN 2 % EX OINT
1.0000 "application " | TOPICAL_OINTMENT | Freq: Two times a day (BID) | CUTANEOUS | Status: AC
  Administered 2018-01-21 – 2018-01-25 (×9): 1 via TOPICAL
  Filled 2018-01-21 (×2): qty 22

## 2018-01-21 MED ORDER — OXYCODONE HCL 5 MG PO TABS
10.0000 mg | ORAL_TABLET | ORAL | Status: DC | PRN
  Administered 2018-01-22: 15 mg via ORAL
  Filled 2018-01-21: qty 3

## 2018-01-21 MED ORDER — EPHEDRINE SULFATE-NACL 50-0.9 MG/10ML-% IV SOSY
PREFILLED_SYRINGE | INTRAVENOUS | Status: DC | PRN
  Administered 2018-01-21 (×3): 10 mg via INTRAVENOUS

## 2018-01-21 MED ORDER — METHOCARBAMOL 500 MG PO TABS
500.0000 mg | ORAL_TABLET | Freq: Four times a day (QID) | ORAL | Status: DC | PRN
  Administered 2018-01-21 – 2018-01-25 (×5): 500 mg via ORAL
  Filled 2018-01-21 (×6): qty 1

## 2018-01-21 MED ORDER — MIDAZOLAM HCL 5 MG/5ML IJ SOLN
INTRAMUSCULAR | Status: DC | PRN
  Administered 2018-01-21: 2 mg via INTRAVENOUS

## 2018-01-21 MED ORDER — PROPOFOL 10 MG/ML IV BOLUS
INTRAVENOUS | Status: DC | PRN
  Administered 2018-01-21: 100 mg via INTRAVENOUS

## 2018-01-21 MED ORDER — CHLORHEXIDINE GLUCONATE 4 % EX LIQD: 60.0000 mL | Freq: Once | CUTANEOUS | Status: DC

## 2018-01-21 SURGICAL SUPPLY — 36 items
BENZOIN TINCTURE PRP APPL 2/3 (GAUZE/BANDAGES/DRESSINGS) ×3 IMPLANT
BLADE SURG 21 STRL SS (BLADE) ×2 IMPLANT
BNDG COHESIVE 6X5 TAN STRL LF (GAUZE/BANDAGES/DRESSINGS) IMPLANT
BNDG GAUZE ELAST 4 BULKY (GAUZE/BANDAGES/DRESSINGS) ×3 IMPLANT
COVER SURGICAL LIGHT HANDLE (MISCELLANEOUS) ×3 IMPLANT
DRAPE INCISE IOBAN 66X45 STRL (DRAPES) ×1 IMPLANT
DRAPE U-SHAPE 47X51 STRL (DRAPES) ×2 IMPLANT
DRESSING PREVENA PLUS CUSTOM (GAUZE/BANDAGES/DRESSINGS) IMPLANT
DRSG ADAPTIC 3X8 NADH LF (GAUZE/BANDAGES/DRESSINGS) ×2 IMPLANT
DRSG PREVENA PLUS CUSTOM (GAUZE/BANDAGES/DRESSINGS) ×2
DURAPREP 26ML APPLICATOR (WOUND CARE) ×1 IMPLANT
ELECT REM PT RETURN 9FT ADLT (ELECTROSURGICAL) ×2
ELECTRODE REM PT RTRN 9FT ADLT (ELECTROSURGICAL) IMPLANT
GAUZE SPONGE 4X4 12PLY STRL (GAUZE/BANDAGES/DRESSINGS) ×2 IMPLANT
GLOVE BIOGEL PI IND STRL 9 (GLOVE) ×1 IMPLANT
GLOVE BIOGEL PI INDICATOR 9 (GLOVE) ×1
GLOVE SURG ORTHO 9.0 STRL STRW (GLOVE) ×2 IMPLANT
GOWN STRL REUS W/ TWL XL LVL3 (GOWN DISPOSABLE) ×2 IMPLANT
GOWN STRL REUS W/TWL XL LVL3 (GOWN DISPOSABLE) ×2
HANDPIECE INTERPULSE COAX TIP (DISPOSABLE)
KIT BASIN OR (CUSTOM PROCEDURE TRAY) ×2 IMPLANT
KIT DRSG PREVENA PLUS 7DAY 125 (MISCELLANEOUS) ×1 IMPLANT
KIT TURNOVER KIT B (KITS) ×2 IMPLANT
MANIFOLD NEPTUNE II (INSTRUMENTS) ×1 IMPLANT
NS IRRIG 1000ML POUR BTL (IV SOLUTION) ×2 IMPLANT
PACK ORTHO EXTREMITY (CUSTOM PROCEDURE TRAY) ×2 IMPLANT
PAD ABD 8X10 STRL (GAUZE/BANDAGES/DRESSINGS) IMPLANT
PAD ARMBOARD 7.5X6 YLW CONV (MISCELLANEOUS) ×3 IMPLANT
SET HNDPC FAN SPRY TIP SCT (DISPOSABLE) IMPLANT
STOCKINETTE IMPERVIOUS 9X36 MD (GAUZE/BANDAGES/DRESSINGS) IMPLANT
SUT ETHILON 2 0 PSLX (SUTURE) ×4 IMPLANT
SWAB COLLECTION DEVICE MRSA (MISCELLANEOUS) ×1 IMPLANT
SWAB CULTURE ESWAB REG 1ML (MISCELLANEOUS) IMPLANT
TOWEL OR 17X26 10 PK STRL BLUE (TOWEL DISPOSABLE) ×2 IMPLANT
TUBE CONNECTING 12X1/4 (SUCTIONS) ×2 IMPLANT
YANKAUER SUCT BULB TIP NO VENT (SUCTIONS) ×2 IMPLANT

## 2018-01-21 NOTE — Anesthesia Preprocedure Evaluation (Signed)
Anesthesia Evaluation  Patient identified by MRN, date of birth, ID band Patient awake    Reviewed: Allergy & Precautions, H&P , NPO status , Patient's Chart, lab work & pertinent test results, reviewed documented beta blocker date and time   History of Anesthesia Complications Negative for: history of anesthetic complications  Airway Mallampati: II  TM Distance: >3 FB Neck ROM: Full    Dental  (+) Teeth Intact, Dental Advisory Given   Pulmonary shortness of breath,    breath sounds clear to auscultation       Cardiovascular hypertension, Pt. on medications and Pt. on home beta blockers + Peripheral Vascular Disease and +CHF  negative cardio ROS   Rhythm:Regular Rate:Normal     Neuro/Psych  Headaches, PSYCHIATRIC DISORDERS Anxiety Depression Bipolar Disorder TIA   GI/Hepatic Neg liver ROS, GERD  Medicated and Controlled,  Endo/Other  diabetes, Insulin Dependent  Renal/GU CRFRenal disease  negative genitourinary   Musculoskeletal   Abdominal   Peds  Hematology  (+) anemia , JEHOVAH'S WITNESS  Anesthesia Other Findings   Reproductive/Obstetrics                             Anesthesia Physical Anesthesia Plan  ASA: III  Anesthesia Plan: General   Post-op Pain Management:    Induction: Intravenous  PONV Risk Score and Plan: 2 and Ondansetron and Dexamethasone  Airway Management Planned: LMA  Additional Equipment: None  Intra-op Plan:   Post-operative Plan: Extubation in OR  Informed Consent: I have reviewed the patients History and Physical, chart, labs and discussed the procedure including the risks, benefits and alternatives for the proposed anesthesia with the patient or authorized representative who has indicated his/her understanding and acceptance.   Dental advisory given  Plan Discussed with: CRNA and Surgeon  Anesthesia Plan Comments:         Anesthesia Quick  Evaluation

## 2018-01-21 NOTE — Anesthesia Procedure Notes (Signed)
Procedure Name: LMA Insertion Date/Time: 01/21/2018 12:43 PM Performed by: Gwyndolyn Saxon, CRNA Pre-anesthesia Checklist: Patient identified, Emergency Drugs available, Suction available, Patient being monitored and Timeout performed Patient Re-evaluated:Patient Re-evaluated prior to induction Oxygen Delivery Method: Circle system utilized Preoxygenation: Pre-oxygenation with 100% oxygen Induction Type: IV induction Ventilation: Mask ventilation without difficulty LMA Size: 5.0 Number of attempts: 1 Comments: LMA placed by Hernandez,srna

## 2018-01-21 NOTE — Transfer of Care (Signed)
Immediate Anesthesia Transfer of Care Note  Patient: Shane [name]  Procedure(s) Performed: IRRIGATION AND DEBRIDEMENT RIGHT FOOT (Right )  Patient Location: PACU  Anesthesia Type:General  Level of Consciousness: drowsy  Airway & Oxygen Therapy: Patient Spontanous Breathing and Patient connected to face mask oxygen  Post-op Assessment: Report given to RN and Post -op Vital signs reviewed and stable  Post vital signs: Reviewed and stable  Last Vitals:  Vitals Value Taken Time  BP 171/99 01/21/2018  1:22 PM  Temp    Pulse 79 01/21/2018  1:26 PM  Resp 12 01/21/2018  1:26 PM  SpO2 100 % 01/21/2018  1:26 PM  Vitals shown include unvalidated device data.  Last Pain:  Vitals:   01/21/18 1320  TempSrc:   PainSc: (P) Asleep         Complications: No apparent anesthesia complications

## 2018-01-21 NOTE — Op Note (Signed)
01/21/2018  1:19 PM  PATIENT:  Shane [name]    PRE-OPERATIVE DIAGNOSIS:  Abscess Right Foot  POST-OPERATIVE DIAGNOSIS:  Same  PROCEDURE:  IRRIGATION AND DEBRIDEMENT RIGHT FOOT, excision of skin soft tissue and muscle sharply. Local tissue rearrangement for wound closure 2 x 7 cm x 2 wounds. Application of Praveena wound VAC.  SURGEON:  Newt Minion, MD  PHYSICIAN ASSISTANT:None ANESTHESIA:   General  PREOPERATIVE INDICATIONS:  Shane [name] is a  47 y.o. male with a diagnosis of Abscess Right Foot who failed conservative measures and elected for surgical management.    The risks benefits and alternatives were discussed with the patient preoperatively including but not limited to the risks of infection, bleeding, nerve injury, cardiopulmonary complications, the need for revision surgery, among others, and the patient was willing to proceed.  OPERATIVE IMPLANTS: Praveena wound VAC  @ENCIMAGES @  OPERATIVE FINDINGS: 2 large areas of necrotic wounds but no deep abscess.  OPERATIVE PROCEDURE: Patient was brought the operating room and underwent a general anesthetic.  After adequate levels of anesthesia were obtained patient's right lower extremity was prepped using Betadine paint and draped into a sterile field a timeout was called.  Elliptical incisions were made around the necrotic wounds on the dorsal and medial aspect of the right foot.  This was carried sharply down with a knife down to muscle.  Muscle skin and soft tissue was excised in 2 blocks of tissue.  There is no deep abscess.  Electrocautery was used for hemostasis.  The wounds were irrigated with normal saline.  Local tissue rearrangement was used to close to wounds that were both 2 x 7 cm.  Wound edges were closely approximated leaving a gap of about 3 mm.  A Praveena plus wound VAC was applied this had a good suction fit patient extubated taken to PACU in stable condition.   DISCHARGE PLANNING:  Antibiotic duration: Continue  IV antibiotics for 24 hours postoperatively  Weightbearing: Touchdown weightbearing on the right  Pain medication: High-dose opioid pathway ordered  Dressing care/ Wound VAC: Continue wound VAC for 1 week  Ambulatory devices: Walker or crutches  Discharge to: Anticipate discharge to home.  Follow-up: In the office 1 week post operative.

## 2018-01-21 NOTE — Interval H&P Note (Signed)
History and Physical Interval Note:  01/21/2018 6:34 AM  Shane [name]  has presented today for surgery, with the diagnosis of Abscess Right Foot  The various methods of treatment have been discussed with the patient and family. After consideration of risks, benefits and other options for treatment, the patient has consented to  Procedure(s): IRRIGATION AND DEBRIDEMENT RIGHT FOOT (Right) as a surgical intervention .  The patient's history has been reviewed, patient examined, no change in status, stable for surgery.  I have reviewed the patient's chart and labs.  Questions were answered to the patient's satisfaction.     Newt Minion

## 2018-01-21 NOTE — Progress Notes (Signed)
Orthopedic Tech Progress Note Patient Details:  Shane [name] 1970/10/11 [id number]  Ortho Devices Type of Ortho Device: Postop shoe/boot Ortho Device/Splint Location: rle Ortho Device/Splint Interventions: Application   Post Interventions Patient Tolerated: Well Instructions Provided: Care of device   Hildred Priest 01/21/2018, 3:58 PM

## 2018-01-22 ENCOUNTER — Encounter (HOSPITAL_COMMUNITY): Payer: Self-pay | Admitting: Orthopedic Surgery

## 2018-01-22 LAB — GLUCOSE, CAPILLARY
GLUCOSE-CAPILLARY: 141 mg/dL — AB (ref 70–99)
Glucose-Capillary: 126 mg/dL — ABNORMAL HIGH (ref 70–99)
Glucose-Capillary: 135 mg/dL — ABNORMAL HIGH (ref 70–99)
Glucose-Capillary: 127 mg/dL — ABNORMAL HIGH (ref 70–99)

## 2018-01-22 NOTE — Care Management Important Message (Signed)
Important Message  Patient Details  Name: Shane [name] MRN: [id number] Date of Birth: [birth year]   Medicare Important Message Given:  Yes    Sourish Allender 01/22/2018, 1:46 PM

## 2018-01-22 NOTE — Evaluation (Signed)
Physical Therapy Evaluation Patient Details Name: Shane [name] MRN: [id number] DOB: 02/19/1971 Today's Date: 01/22/2018   History of Present Illness  Pt is a 47 y/o male s/p I&D R foot wound with wound VAC application. PMH including but not limited to Bipolar disorder, HTN, CHF and L transmet amputation in 2018.    Clinical Impression  Pt presented supine in bed with HOB elevated, awake and willing to participate in therapy session. Prior to admission, pt reported that he ambulated with use of SPC and was independent with ADLs. Pt lives in a ground level apartment with his wife and granddaughter. Pt currently requires min guard for bed mobility and min guard for transfers with RW. Pt very limited this session secondary to pain, dizziness and feeling lightheaded that only resolved once reclined back in chair. Pt would continue to benefit from skilled physical therapy services at this time while admitted and after d/c to address the below listed limitations in order to improve overall safety and independence with functional mobility.  Patient is s/p I&D R foot wound with wound VAC application which impairs their ability to perform daily activities in the home. A walker alone will not resolve the issues with performing activities of daily living. A wheelchair will allow patient to safely perform daily activities. The patient can self propel in the home or has a caregiver who can provide assistance.     Follow Up Recommendations Home health PT;Supervision/Assistance - 24 hour    Equipment Recommendations  Rolling walker with 5" wheels;Wheelchair (measurements PT);Wheelchair cushion (measurements PT)    Recommendations for Other Services       Precautions / Restrictions Precautions Precautions: Fall Precaution Comments: wound VAC Restrictions Weight Bearing Restrictions: Yes RLE Weight Bearing: Non weight bearing(per Dr. Gavin Potters note, ideally NWB)      Mobility  Bed Mobility Overal bed  mobility: Needs Assistance Bed Mobility: Supine to Sit     Supine to sit: Min guard     General bed mobility comments: pt required increased time and effort, min guard for safety  Transfers Overall transfer level: Needs assistance Equipment used: Rolling walker (2 wheeled) Transfers: Sit to/from [facility] Sit to Stand: Min guard;From elevated surface Stand pivot transfers: Min guard       General transfer comment: increased time and effort, cueing for safe hand placement, pt required bed in elevated position to achieve full standing position and min guard for pivot to chair  Ambulation/Gait             General Gait Details: pt limited secondary to feeling dizzy and lightheaded  Stairs            Wheelchair Mobility    Modified Rankin (Stroke Patients Only)       Balance Overall balance assessment: Needs assistance Sitting-balance support: No upper extremity supported Sitting balance-Leahy Scale: Fair     Standing balance support: Bilateral upper extremity supported Standing balance-Leahy Scale: Poor                               Pertinent Vitals/Pain Pain Assessment: 0-10 Pain Score: 10-Worst pain ever Pain Location: R foot Pain Descriptors / Indicators: Sore;Grimacing;Guarding Pain Intervention(s): Monitored during session;Repositioned    Home Living Family/patient expects to be discharged to:: Private residence Living Arrangements: Spouse/significant other Available Help at Discharge: Family;Available 24 hours/day Type of Home: Apartment Home Access: Level entry     Home Layout: One level Home Equipment:  Cane - single point;Walker - 4 wheels      Prior Function Level of Independence: Independent with assistive device(s)         Comments: pt ambulates with use SPC at all times     Hand Dominance        Extremity/Trunk Assessment   Upper Extremity Assessment Upper Extremity Assessment: Overall WFL  for tasks assessed    Lower Extremity Assessment Lower Extremity Assessment: Generalized weakness       Communication   Communication: No difficulties  Cognition Arousal/Alertness: Awake/alert Behavior During Therapy: WFL for tasks assessed/performed Overall Cognitive Status: Within Functional Limits for tasks assessed                                        General Comments      Exercises     Assessment/Plan    PT Assessment Patient needs continued PT services  PT Problem List Decreased strength;Decreased activity tolerance;Decreased balance;Decreased mobility;Decreased coordination;Decreased knowledge of use of DME;Decreased safety awareness;Decreased knowledge of precautions;Pain       PT Treatment Interventions DME instruction;Gait training;Stair training;Functional mobility training;Balance training;Therapeutic exercise;Therapeutic activities;Neuromuscular re-education;Patient/family education;Wheelchair mobility training    PT Goals (Current goals can be found in the Care Plan section)  Acute Rehab PT Goals Patient Stated Goal: decrease pain PT Goal Formulation: With patient Time For Goal Achievement: 02/05/18 Potential to Achieve Goals: Good    Frequency Min 3X/week   Barriers to discharge        Co-evaluation               AM-PAC PT "6 Clicks" Daily Activity  Outcome Measure Difficulty turning over in bed (including adjusting bedclothes, sheets and blankets)?: None Difficulty moving from lying on back to sitting on the side of the bed? : A Little Difficulty sitting down on and standing up from a chair with arms (e.g., wheelchair, bedside commode, etc,.)?: Unable Help needed moving to and from a bed to chair (including a wheelchair)?: A Little Help needed walking in hospital room?: A Lot Help needed climbing 3-5 steps with a railing? : Total 6 Click Score: 14    End of Session Equipment Utilized During Treatment: Gait belt Activity  Tolerance: Patient limited by fatigue;Patient limited by pain;Other (comment)(pt limited secondary to dizziness/lightheadedness) Patient left: in chair;with call bell/phone within reach Nurse Communication: Mobility status PT Visit Diagnosis: Other abnormalities of gait and mobility (R26.89);Pain Pain - Right/Left: Right Pain - part of body: Ankle and joints of foot    Time: 2633-3545 PT Time Calculation (min) (ACUTE ONLY): 21 min   Charges:   PT Evaluation $PT Eval Moderate Complexity: [place]al[place], [place], [place] Armstrong 01/22/2018, 2:57 PM

## 2018-01-22 NOTE — Progress Notes (Signed)
Patient ID: Shane [name], male   DOB: August 06, 1970, 47 y.o.   MRN: [id number] Postoperative day 1 irrigation debridement 2 abscesses right foot.  The wound VAC is functioning well minimal drainage.  Patient may discharge to home with the portable pump.  I will follow-up in the office in 1 week.  Recommend discharge on doxycycline 100 mg twice a day for a month.  Ideally nonweightbearing on the right foot.

## 2018-01-23 DIAGNOSIS — N179 Acute kidney failure, unspecified: Secondary | ICD-10-CM

## 2018-01-23 DIAGNOSIS — R0902 Hypoxemia: Secondary | ICD-10-CM

## 2018-01-23 DIAGNOSIS — R079 Chest pain, unspecified: Secondary | ICD-10-CM

## 2018-01-23 LAB — GLUCOSE, CAPILLARY
GLUCOSE-CAPILLARY: 201 mg/dL — AB (ref 70–99)
GLUCOSE-CAPILLARY: 88 mg/dL (ref 70–99)
Glucose-Capillary: 126 mg/dL — ABNORMAL HIGH (ref 70–99)
Glucose-Capillary: 108 mg/dL — ABNORMAL HIGH (ref 70–99)

## 2018-01-23 MED ORDER — DOXYCYCLINE HYCLATE 100 MG PO TABS: 100.0000 mg | ORAL_TABLET | Freq: Two times a day (BID) | ORAL | 0 refills | Status: AC

## 2018-01-23 MED ORDER — OXYCODONE HCL 5 MG PO TABS: 5.0000 mg | ORAL_TABLET | ORAL | 0 refills | Status: DC | PRN

## 2018-01-23 MED ORDER — METHOCARBAMOL 500 MG PO TABS: 500.0000 mg | ORAL_TABLET | Freq: Four times a day (QID) | ORAL | 0 refills | Status: AC | PRN

## 2018-01-23 MED ORDER — DOXYCYCLINE HYCLATE 100 MG PO TABS
100.0000 mg | ORAL_TABLET | Freq: Two times a day (BID) | ORAL | Status: DC
  Administered 2018-01-23 – 2018-01-26 (×7): 100 mg via ORAL
  Filled 2018-01-23 (×7): qty 1

## 2018-01-23 MED FILL — DOXYCYCLINE HYCLATE 100 MG: 100 | 7 days supply | Qty: 14 | Fill #0

## 2018-01-23 MED FILL — METHOCARBAMOL 500 MG TABS: 500 | 7 days supply | Qty: 30 | Fill #0

## 2018-01-23 NOTE — Anesthesia Postprocedure Evaluation (Signed)
Anesthesia Post Note  Patient: Shane [name]  Procedure(s) Performed: IRRIGATION AND DEBRIDEMENT RIGHT FOOT (Right )     Patient location during evaluation: PACU Anesthesia Type: General Level of consciousness: awake and alert Pain management: pain level controlled Vital Signs Assessment: post-procedure vital signs reviewed and stable Respiratory status: spontaneous breathing, nonlabored ventilation, respiratory function stable and patient connected to nasal cannula oxygen Cardiovascular status: blood pressure returned to baseline and stable Postop Assessment: no apparent nausea or vomiting Anesthetic complications: no    Last Vitals:  Vitals:   01/22/18 1300 01/22/18 1900  BP: 128/72 101/64  Pulse: 78 80  Resp: 17 15  Temp: 36.7 C 36.8 C  SpO2: 98% 95%    Last Pain:  Vitals:   01/22/18 1900  TempSrc: Oral  PainSc:                  Kenni Newton

## 2018-01-23 NOTE — Progress Notes (Signed)
Subjective: 2 Days Post-Op Procedure(s) (LRB): IRRIGATION AND DEBRIDEMENT RIGHT FOOT (Right) Reports pain is under control.  Accidentally removed VAC dressing and this to be replaced per nursing.    Objective: Vital signs in last 24 hours: Temp:  [98 F (36.7 C)-98.3 F (36.8 C)] 98.3 F (36.8 C) (08/08 1900) Pulse Rate:  [78-80] 80 (08/08 1900) Resp:  [15-17] 15 (08/08 1900) BP: (101-128)/(64-72) 101/64 (08/08 1900) SpO2:  [95 %-98 %] 95 % (08/08 1900)  Intake/Output from previous day: 08/08 0701 - 08/09 0700 In: 240 [P.O.:240] Out: 1510 [Urine:1360; Drains:150] Intake/Output this shift: Total I/O In: -  Out: 450 [Urine:450]  No results for input(s): HGB in the last 72 hours. No results for input(s): WBC, RBC, HCT, PLT in the last 72 hours. Recent Labs    01/21/18 0402  NA 141  K 4.2  CL 103  CO2 31  BUN 21*  CREATININE 1.55*  GLUCOSE 137*  CALCIUM 8.5*   No results for input(s): LABPT, INR in the last 72 hours.  Neurologically intact VAC buttress off, nursing reapplying    Assessment/Plan: 2 Days Post-Op Procedure(s) (LRB): IRRIGATION AND DEBRIDEMENT RIGHT FOOT (Right) Continue VAC therapy. Nursing reapplied without problems.  Plan for follow up with Dr. Sharol Given in the office next week.  Recommend Doxycycline following DC.  Have asked FPTS to see patient if possible prior to DC.  Plan DC home tomorrow.   Aniyia Rane 01/23/2018, 10:07 AM  [phone]

## 2018-01-23 NOTE — Discharge Instructions (Signed)
Keep Prevena VAC machine plugged into wall socket unless up walking

## 2018-01-23 NOTE — Consult Note (Signed)
Family Medicine Teaching Service Consult Note Service Pager: [phone]  Patient name: Shane [name] Medical record number: [id number] Date of birth: 07-11-1970 Age: 47 y.o. Gender: male  Primary Care Provider: Charlott Rakes, MD  Chief Complaint: R foot cellulitis and ulcer. FPTS consulted for diabetes management   Assessment and Plan: Shane [name] is a 47 y.o. male presenting with R foot cellulitis and ulcer . PMH is significant for T2DM, PDA, depression, insomnia, CKD, HTN.  T2DM with peripheral neuropathy. Last a1c 11/20/17 was 9.4. CBGs 126-197 over the last 24h. At home on lantus 40U qd and glipizide 10mg  q am. Not able to tolerate metformin due to diarrhea - currently on glipizide 10mg  q am and sSSI - on DC would resume home regimen of lantus 40U qd and glipizide 10mg  q am. - continue home gabapentin 300mg  BID  R foot abscess s/p irritation and debridement on POD#2 - per Ortho who is primary   HTN/HFpEF. Stable - continue home coreg 25mg  BID, lisinoprol 10mg  qd,   HLD. Stable - continue home atorvastatin 80mg  qd and asa81  Depression/Insomnia, stable - continue home prozac 40mg  qd, trazodone 100mg  qhs, tegretol 200mg  qhs  CKD III, stable. Cr at patient baseline of 1.5 - continue home lisinopril as per above  BPH - continue home tamsulosin 0.4 mg qd  FEN/GI: carb modified diet, protonix Prophylaxis: SCDs  Disposition: DC home per ortho tomorrow. [facility]  History of Present Illness:  Shane [name] is a 47 y.o. male presenting with R foot cellulitis and ulcer on 01/20/18. Had developed acute ulcers 2 weeks prior to admission while on outpatient augmentin. Now postoperatively he states he has a little foot pain that is managed well by pain meds. He overall feels well. Has been eating well. No fever/chills. Urinating well and stooling well. He has no concerns this morning.  He is following with [facility] but recently got new insurance and plans to switch PCPs to [facility] the next few months.   Review Of Systems: Per HPI with the following additions:   Review of Systems  Constitutional: Negative for chills and fever.  Respiratory: Negative for shortness of breath.   Cardiovascular: Negative for chest pain and palpitations.  Gastrointestinal: Negative for abdominal pain, constipation, diarrhea, heartburn, nausea and vomiting.  Genitourinary: Negative for dysuria, frequency and urgency.  Musculoskeletal:       R foot pain  Neurological: Negative for dizziness and headaches.    Patient Active Problem List   Diagnosis Date Noted  . Abscess of right foot 01/19/2018  . Depression   . PAD (peripheral artery disease) ([place])   . Midfoot skin ulcer, right, limited to breakdown of skin ([place]) 08/14/2017  . Hypertension, uncontrolled 08/08/2017  . Cellulitis of foot, right 08/07/2017  . Bulging lumbar disc 07/04/2017  . Weakness of right lower extremity 05/19/2017  . Cellulitis 05/18/2017  . Achilles tendon contracture, left 01/14/2017  . Gingivitis 11/14/2016  . Achilles tendon contracture, right 09/17/2016  . Pain and swelling of left lower leg 09/16/2016  . CKD (chronic kidney disease) stage 3, GFR 30-59 ml/min (HCC) 08/27/2016  . Type 2 diabetes mellitus with stage 3 chronic kidney disease, with long-term current use of insulin ([place]) 07/22/2016  . Constipation 07/02/2016  . S/P transmetatarsal amputation of foot, left ([place]) 07/02/2016  . Dyspnea on exertion 07/02/2016  . Diabetic gastroparesis ([place]) 05/30/2016  . Buzzing in ear, right 03/26/2016  .  Joint pain 03/11/2016  . Weakness 03/08/2016  . Dizziness 03/03/2016  . Chronic diastolic CHF (congestive heart failure), NYHA class 1 ([place]) 12/30/2015  . Diabetes mellitus type 2, uncontrolled, with complications ([place]) 61/60/7371  . Depression with anxiety 12/01/2015  . Pain in the chest 12/01/2015  . Insomnia 11/21/2015   . GERD (gastroesophageal reflux disease) 08/18/2015  . Erectile dysfunction 07/04/2015  . Symptomatic anemia 04/24/2015  . Left arm weakness 04/02/2015  . Sensory disturbance 04/02/2015  . Essential hypertension 04/02/2015  . Hemispheric carotid artery syndrome   . HLD (hyperlipidemia)   . Headache     Past Medical History: Past Medical History:  Diagnosis Date  . Anemia   . Bipolar disorder ([place])   . Cellulitis 05/2017   RIGHT FOOT   DIABETIC ULCER   . Cellulitis and abscess of left leg 06/2016  . Chest pain    a. 2015 Reportedly normal stress test in [place].  Marland Kitchen Chronic diastolic CHF (congestive heart failure) (HCC)    a.03/2015 Echo: EF 55-60%, Gr 1 DD, mild MR, triv PR.  . Depression   . Depression with anxiety   . Dyspnea    with exertion  . GERD (gastroesophageal reflux disease)   . Glaucoma   . Hyperlipidemia   . Hypertension    a. 08/2014 Admitted with hypertensive urgency.  . Insomnia   . Internal carotid artery stenosis   . Lower GI bleed 07/04/2015  . Neuropathy   . Refusal of blood transfusions as patient is Jehovah's Witness   . TIA (transient ischemic attack) 08/2014; 03/2015   a. 08/2014 in setting of hypertensive urgency.  . Type II diabetes mellitus ([place])    started insulin spring 2016, Type II  . Vitamin D deficiency spring 2016    Past Surgical History: Past Surgical History:  Procedure Laterality Date  . AMPUTATION Left 08/03/2015   Procedure: AMPUTATION LEFT GREAT TOE;  Surgeon: Newt Minion, MD;  Location: [facility];  Service: Orthopedics;  Laterality: Left;  . AMPUTATION Left 12/02/2015   Procedure: amputation of left 2nd digit  . AMPUTATION Left 04/10/2016   Procedure: Left 2nd Toe Amputation at MTP Joint;  Surgeon: Newt Minion, MD;  Location: [place];  Service: Orthopedics;  Laterality: Left;  . AMPUTATION Left 06/09/2016   Procedure: AMPUTATION LEFT THIRD TOE;  Surgeon: [name], MD;  Location: [place];  Service: Orthopedics;  Laterality: Left;   . AMPUTATION Left 06/26/2016   Procedure: Left Foot Transmetatarsal Amputation;  Surgeon: Newt Minion, MD;  Location: [facility];  Service: Orthopedics;  Laterality: Left;  . APPLICATION OF WOUND VAC Left 12/19/2015   Procedure: APPLICATION OF WOUND VAC;  Surgeon: Meredith Pel, MD;  Location: [place];  Service: Orthopedics;  Laterality: Left;  . CIRCUMCISION    . COLONOSCOPY N/A 01/22/2016   Procedure: COLONOSCOPY;  Surgeon: Gatha Mayer, MD;  Location: [facility];  Service: Endoscopy;  Laterality: N/A;  . ESOPHAGOGASTRODUODENOSCOPY N/A 01/19/2016   Procedure: ESOPHAGOGASTRODUODENOSCOPY (EGD);  Surgeon: Manus Gunning, MD;  Location: [facility];  Service: Gastroenterology;  Laterality: N/A;  . ESOPHAGOGASTRODUODENOSCOPY N/A 01/22/2016   Procedure: ESOPHAGOGASTRODUODENOSCOPY (EGD);  Surgeon: Gatha Mayer, MD;  Location[facility] ENDOSCOPY;  Service: Endoscopy;  Laterality: N/A;  . I&D EXTREMITY Left 12/02/2015   Procedure: IRRIGATION AND DEBRIDEMENT OF FOOT; LEFT SECOND TOE AMPUTATION;  Surgeon: Meredith Pel, MD;  Location: [place];  Service: Orthopedics;  Laterality: Left;  . I&D EXTREMITY Left 12/19/2015   Procedure:  I & D LEFT FOOT WITH BEADS ;  Surgeon: Meredith Pel, MD;  Location: [facility];  Service: Orthopedics;  Laterality: Left;  . I&D EXTREMITY Right 01/17/2016   Procedure: IRRIGATION AND DEBRIDEMENT RIGHT FOOT;  Surgeon: Newt Minion, MD;  Location: [place];  Service: Orthopedics;  Laterality: Right;  . I&D EXTREMITY Right 01/21/2018   Procedure: IRRIGATION AND DEBRIDEMENT RIGHT FOOT;  Surgeon: Newt Minion, MD;  Location: [place];  Service: Orthopedics;  Laterality: Right;  . INCISION AND DRAINAGE FOOT Right 01/17/2016  . INGUINAL HERNIA REPAIR Bilateral ~ 1983- ~ 1986  . TONSILLECTOMY  ~ 26    Social History: Social History   Tobacco Use  . Smoking status: Never Smoker  . Smokeless tobacco: Never Used  Substance Use Topics  . Alcohol use: No    Alcohol/week: 0.0  standard drinks  . Drug use: No   Additional social history: none  Please also refer to relevant sections of EMR.  Family History: Family History  Problem Relation Age of Onset  . Hypertension Mother   . Diabetes Mother   . Hyperlipidemia Mother   . Heart disease Mother        s/p pacemaker  . Diabetes Father   . Hypertension Father   . Stroke Father   . Heart attack Father        first MI @ 59.  . Depression Father   . Dementia Father   . Stroke Brother   . ADD / ADHD Brother   . Anxiety disorder Brother   . Bipolar disorder Brother   . OCD Brother   . Sexual abuse Brother     Allergies and Medications: Allergies  Allergen Reactions  . Other Diarrhea and Other (See Comments)    Red meat causes stomach pains, bloating and diarrhea  . Lactose Intolerance (Gi) Diarrhea and Other (See Comments)    Bloating   . Milk-Related Compounds Diarrhea and Other (See Comments)    Any dairy products  > lactose intolerance diarrhea and bloating   No current facility-administered medications on file prior to encounter.    Current Outpatient Medications on File Prior to Encounter  Medication Sig Dispense Refill  . acetaminophen (TYLENOL) 325 MG tablet Take 2 tablets (650 mg total) by mouth every 6 (six) hours as needed for mild pain, moderate pain or fever (or Fever >/= 101).    Marland Kitchen aspirin EC 81 MG tablet Take 1 tablet (81 mg total) by mouth daily. 30 tablet 11  . atorvastatin (LIPITOR) 80 MG tablet Take 1 tablet (80 mg total) by mouth every morning. 30 tablet 5  . carbamazepine (TEGRETOL) 200 MG tablet TAKE 1 TABLET BY MOUTH AT BEDTIME. 90 tablet 4  . carvedilol (COREG) 25 MG tablet Take 1 tablet (25 mg total) by mouth 2 (two) times daily with a meal. 60 tablet 6  . FLUoxetine (PROZAC) 40 MG capsule Take 1 capsule (40 mg total) by mouth daily. 90 capsule 1  . folic acid (FOLVITE) 1 MG tablet Take 1 tablet (1 mg total) by mouth daily. 30 tablet 3  . gabapentin (NEURONTIN) 300 MG  capsule Take 1 capsule (300 mg total) by mouth 2 (two) times daily. 60 capsule 6  . glipiZIDE (GLUCOTROL XL) 10 MG 24 hr tablet Take 1 tablet (10 mg total) by mouth daily with breakfast. 30 tablet 6  . Insulin Glargine (LANTUS SOLOSTAR) 100 UNIT/ML Solostar Pen Inject 35 Units into the skin 2 (two) times daily. (Patient taking  differently: Inject 40 Units into the skin daily. ) 30 mL 6  . lisinopril (PRINIVIL,ZESTRIL) 10 MG tablet Take 1 tablet (10 mg total) by mouth daily. 30 tablet 0  . pantoprazole (PROTONIX) 40 MG tablet Take 1 tablet (40 mg total) by mouth daily. 30 tablet 3  . polyethylene glycol (MIRALAX / GLYCOLAX) packet Take 17 g by mouth daily as needed for moderate constipation. (Patient taking differently: Take 17 g by mouth daily as needed for moderate constipation. Mix in 8 oz liquid and drink) 14 each 0  . senna-docusate (SENOKOT-S) 8.6-50 MG tablet Take 1 tablet by mouth at bedtime. (Patient taking differently: Take 1 tablet by mouth at bedtime as needed for mild constipation. ) 60 tablet 2  . silver sulfADIAZINE (SILVADENE) 1 % cream Apply topically 2 (two) times daily. Apply to wound on top of your right foot. Cover with dry dressing. 50 g 0  . sucralfate (CARAFATE) 1 g tablet TAKE 1 TABLET (1 G TOTAL) BY MOUTH 4 (FOUR) TIMES DAILY - WITH MEALS AND AT BEDTIME. 120 tablet 0  . tamsulosin (FLOMAX) 0.4 MG CAPS capsule Take 1 capsule (0.4 mg total) by mouth daily. 30 capsule 0  . traZODone (DESYREL) 100 MG tablet Take 1 tablet (100 mg total) by mouth at bedtime.    . Vitamin D, Ergocalciferol, (DRISDOL) 50000 units CAPS capsule Take 1 capsule (50,000 Units total) by mouth See admin instructions. Take one capsule (50000 units) by mouth on the 1st Sunday of each month    . Blood Glucose Monitoring Suppl (TRUE METRIX METER) DEVI 1 each by Does not apply route 3 (three) times daily. 1 Device 0  . Elastic Bandages & Supports (T.E.D. KNEE LENGTH/S-REGULAR) MISC Wear when awake 1 each 0  .  glucose blood (ACCU-CHEK AVIVA PLUS) test strip Use as instructed 100 each 12  . glucose blood (TRUE METRIX BLOOD GLUCOSE TEST) test strip 1 each by Other route 3 (three) times daily. 100 each 12    Objective: BP 101/64 (BP Location: Right Arm)   Pulse 80   Temp 98.3 F (36.8 C) (Oral)   Resp 15   Ht 5\' 7"  (1.702 m)   Wt 91.6 kg   SpO2 95%   BMI 31.64 kg/m  Exam: General: sitting up in chair, well appearing, in NAD Eyes: EOMI, conjunctiva normal ENTM: MMM, oropharynx nonerythematous Neck: supple, normal ROM Cardiovascular: RRR, no murmurs. No LE edema Respiratory: CTAB, normal effort on RA Gastrointestinal: soft, nontender, nondistended, + bowel sounds MSK: R foot with dressing c/d/i and tubing with serosanguineos drainage. L foot is s/p TMA Derm: warm and dry Neuro: alert and awake, no focal deficits Psych: appropriate affect and pleasant  Labs and Imaging: CBC BMET  Recent Labs  Lab 01/19/18 1113  WBC 9.0  HGB 11.2*  HCT 35.3*  PLT 255   Recent Labs  Lab 01/21/18 0402  NA 141  K 4.2  CL 103  CO2 31  BUN 21*  CREATININE 1.55*  GLUCOSE 137*  CALCIUM 8.5Bufford Lope, DO 01/23/2018, 10:47 AM PGY-3, [place] Intern pager: [phone], text pages welcome

## 2018-01-23 NOTE — Progress Notes (Signed)
Physical Therapy Treatment Patient Details Name: Shane [name] MRN: [id number] DOB: 1970-08-13 Today's Date: 01/23/2018    History of Present Illness Pt is a 47 y/o male s/p I&D R foot wound with wound VAC application. PMH including but not limited to Bipolar disorder, HTN, CHF and L transmet amputation in 2018.    PT Comments    Pt making steady progress with functional mobility and tolerated short distance ambulation this session with min guard and use of RW. Pt would continue to benefit from skilled physical therapy services at this time while admitted and after d/c to address the below listed limitations in order to improve overall safety and independence with functional mobility.    Follow Up Recommendations  Home health PT;Supervision/Assistance - 24 hour     Equipment Recommendations  Rolling walker with 5" wheels    Recommendations for Other Services       Precautions / Restrictions Precautions Precautions: Fall Precaution Comments: wound VAC Restrictions Weight Bearing Restrictions: Yes RLE Weight Bearing: Non weight bearing    Mobility  Bed Mobility Overal bed mobility: Needs Assistance Bed Mobility: Supine to Sit     Supine to sit: Min guard     General bed mobility comments: pt required increased time and effort, min guard for safety  Transfers Overall transfer level: Needs assistance Equipment used: Rolling walker (2 wheeled) Transfers: Sit to/from Stand Sit to Stand: Min guard;From elevated surface         General transfer comment: increased time and effort, cueing for safe hand placement, pt required bed in elevated position to achieve full standing position and min guard for pivot to chair  Ambulation/Gait Ambulation/Gait assistance: Min guard Gait Distance (Feet): 20 Feet Assistive device: Rolling walker (2 wheeled) Gait Pattern/deviations: (hop-to L LE) Gait velocity: decreased Gait velocity interpretation: <1.31 ft/sec, indicative of  household ambulator General Gait Details: pt steady with RW while maintaining NWB R LE throughout, min guard for safety; pt limited secondary to L LE fatigue   Stairs             Wheelchair Mobility    Modified Rankin (Stroke Patients Only)       Balance Overall balance assessment: Needs assistance Sitting-balance support: No upper extremity supported Sitting balance-Leahy Scale: Good     Standing balance support: Bilateral upper extremity supported Standing balance-Leahy Scale: Poor                              Cognition Arousal/Alertness: Awake/alert Behavior During Therapy: WFL for tasks assessed/performed Overall Cognitive Status: Within Functional Limits for tasks assessed                                        Exercises      General Comments        Pertinent Vitals/Pain Pain Assessment: 0-10 Pain Score: 10-Worst pain ever Pain Location: R foot Pain Descriptors / Indicators: Sore;Grimacing;Guarding Pain Intervention(s): Monitored during session;Repositioned    Home Living                      Prior Function            PT Goals (current goals can now be found in the care plan section) Acute Rehab PT Goals PT Goal Formulation: With patient Time For Goal Achievement: 02/05/18 Potential to Achieve Goals: Good  Progress towards PT goals: Progressing toward goals    Frequency    Min 3X/week      PT Plan Current plan remains appropriate    Co-evaluation              AM-PAC PT "6 Clicks" Daily Activity  Outcome Measure  Difficulty turning over in bed (including adjusting bedclothes, sheets and blankets)?: None Difficulty moving from lying on back to sitting on the side of the bed? : A Little Difficulty sitting down on and standing up from a chair with arms (e.g., wheelchair, bedside commode, etc,.)?: Unable Help needed moving to and from a bed to chair (including a wheelchair)?: None Help needed  walking in hospital room?: A Little Help needed climbing 3-5 steps with a railing? : A Lot 6 Click Score: 17    End of Session Equipment Utilized During Treatment: Gait belt Activity Tolerance: Patient tolerated treatment well Patient left: in chair;with call bell/phone within reach Nurse Communication: Mobility status PT Visit Diagnosis: Other abnormalities of gait and mobility (R26.89);Pain Pain - Right/Left: Right Pain - part of body: Ankle and joints of foot     Time: 8832-5498 PT Time Calculation (min) (ACUTE ONLY): 14 min  Charges:  $Gait Training: 8-22 mins                     [place], [place], [place] 01/23/2018, 1:00 PM

## 2018-01-24 ENCOUNTER — Inpatient Hospital Stay (HOSPITAL_COMMUNITY): Payer: Medicare HMO

## 2018-01-24 DIAGNOSIS — R0902 Hypoxemia: Secondary | ICD-10-CM

## 2018-01-24 DIAGNOSIS — E119 Type 2 diabetes mellitus without complications: Secondary | ICD-10-CM

## 2018-01-24 DIAGNOSIS — Z794 Long term (current) use of insulin: Secondary | ICD-10-CM

## 2018-01-24 DIAGNOSIS — I10 Essential (primary) hypertension: Secondary | ICD-10-CM

## 2018-01-24 DIAGNOSIS — R0602 Shortness of breath: Secondary | ICD-10-CM

## 2018-01-24 LAB — TROPONIN I
TROPONIN I: 0.03 ng/mL — AB (ref <0.03)
Troponin I: 0.04 ng/mL (ref <0.03)
Troponin I: 0.05 ng/mL (ref <0.03)
Troponin I: 0.03 ng/mL (ref <0.03)

## 2018-01-24 LAB — GLUCOSE, CAPILLARY
Glucose-Capillary: 152 mg/dL — ABNORMAL HIGH (ref 70–99)
Glucose-Capillary: 109 mg/dL — ABNORMAL HIGH (ref 70–99)
Glucose-Capillary: 181 mg/dL — ABNORMAL HIGH (ref 70–99)
Glucose-Capillary: 142 mg/dL — ABNORMAL HIGH (ref 70–99)

## 2018-01-24 LAB — BASIC METABOLIC PANEL
ANION GAP: 7 (ref 5–15)
BUN: 25 mg/dL — ABNORMAL HIGH (ref 6–20)
CHLORIDE: 108 mmol/L (ref 98–111)
CO2: 26 mmol/L (ref 22–32)
Calcium: 8.2 mg/dL — ABNORMAL LOW (ref 8.9–10.3)
Creatinine, Ser: 1.9 mg/dL — ABNORMAL HIGH (ref 0.61–1.24)
GFR calc Af Amer: 47 mL/min — ABNORMAL LOW (ref >60)
GFR, EST NON AFRICAN AMERICAN: 40 mL/min — AB (ref >60)
GLUCOSE: 123 mg/dL — AB (ref 70–99)
POTASSIUM: 4.1 mmol/L (ref 3.5–5.1)
Sodium: 141 mmol/L (ref 135–145)

## 2018-01-24 LAB — CBC
HCT: 25.5 % — ABNORMAL LOW (ref 39.0–52.0)
HEMOGLOBIN: 7.8 g/dL — AB (ref 13.0–17.0)
MCH: 29.9 pg (ref 26.0–34.0)
MCHC: 30.6 g/dL (ref 30.0–36.0)
MCV: 97.7 fL (ref 78.0–100.0)
PLATELETS: 250 10*3/uL (ref 150–400)
RBC: 2.61 MIL/uL — AB (ref 4.22–5.81)
RDW: 13.3 % (ref 11.5–15.5)
WBC: 9.4 10*3/uL (ref 4.0–10.5)

## 2018-01-24 MED ORDER — IOPAMIDOL (ISOVUE-370) INJECTION 76%
100.0000 mL | Freq: Once | INTRAVENOUS | Status: AC | PRN
  Administered 2018-01-24: 100 mL via INTRAVENOUS

## 2018-01-24 MED ORDER — IOPAMIDOL (ISOVUE-370) INJECTION 76%: 100.0000 mL | Freq: Once | INTRAVENOUS | Status: DC | PRN

## 2018-01-24 MED ORDER — MORPHINE SULFATE (PF) 2 MG/ML IV SOLN
1.0000 mg | Freq: Once | INTRAVENOUS | Status: AC
  Administered 2018-01-24: 1 mg via INTRAVENOUS
  Filled 2018-01-24: qty 1

## 2018-01-24 MED ORDER — HYDRALAZINE HCL 20 MG/ML IJ SOLN
5.0000 mg | Freq: Four times a day (QID) | INTRAMUSCULAR | Status: DC | PRN
  Administered 2018-01-24 – 2018-01-25 (×2): 5 mg via INTRAVENOUS
  Filled 2018-01-24 (×2): qty 1

## 2018-01-24 MED ORDER — HYDRALAZINE HCL 20 MG/ML IJ SOLN
5.0000 mg | Freq: Once | INTRAMUSCULAR | Status: DC
  Filled 2018-01-24: qty 1

## 2018-01-24 MED ORDER — AMLODIPINE BESYLATE 10 MG PO TABS
10.0000 mg | ORAL_TABLET | Freq: Every day | ORAL | Status: DC
  Administered 2018-01-24 – 2018-01-26 (×3): 10 mg via ORAL
  Filled 2018-01-24 (×3): qty 1

## 2018-01-24 NOTE — Progress Notes (Addendum)
Per previous phone call regarding pt's BP RN was told to continue to monitor Pt's BP and to call back if BP is sys >180 or if dys >110. Resident paged to inform pt's BP 188/103 MAP (126). Awaiting call back from resident. Pt is able to communicate and o2 sats  98 with 2L [place].   Pt states he has have pain with urination since Wednesday and lower back pain that comes and goes. Pt states he did not say anything about those symptoms because he thought they were normal to have after surgery. Will make MD aware.

## 2018-01-24 NOTE — Progress Notes (Signed)
Patient ID: Shane [name], male   DOB: 12-06-1970, 47 y.o.   MRN: [id number]  Paged for evaluation of new onset chest pain due to inability to reach the primary orthopedic team. Currently, we are the consultants for diabetes and HTN only.  Soon after, notified they were able to reach his primary team, who will be managing his symptomatology.   Darrelyn Hillock, DO

## 2018-01-24 NOTE — Consult Note (Signed)
Family Medicine Teaching Service Consult Note Service Pager: [phone]  Patient name: Shane [name] Medical record number: [id number] Date of birth: 1971-04-07 Age: 47 y.o. Gender: male  Primary Care Provider: Charlott Rakes, MD  Chief Complaint: R foot cellulitis and ulcer. FPTS consulted for diabetes management   Assessment and Plan: Shane [name] is a 47 y.o. male presenting with R foot cellulitis and ulcer . PMH is significant for T2DM, PDA, depression, insomnia, CKD, HTN.  Chest pain, SOB: sudden onset overnight, rapid response was called. CTA done, no PE, trace pulmonary fluid. On my exam, patient is TTP over left sternum, states pain also worse with lying flat. Likely MSK vs GI etiology. Personally weaned him to room air this am. He fell back to sleep quickly during my exam and O2 sat decreased to 89-90 while sleeping, consider sleep study outpatient. Can continue to trend 2 more troponins. -consider lidocaine patch to L chest -consider GI cocktail PRN -incentive spirometry  Acute on chronic CKD III. Cr 1.90 and GFR reduced to 40 today. Baseline Cr 1.5.  - recommend HOLDING home lisinopril pending repeat Cr - recommend repeat BMP in am - recommend hydral IV PRN BP >160/110 while awaiting additional Cr   T2DM with peripheral neuropathy. Last a1c 11/20/17 was 9.4. CBGs 126-197 over the last 24h. At home on lantus 40U qd and glipizide 10mg  q am. Not able to tolerate metformin due to diarrhea - currently on glipizide 10mg  q am and sSSI - on DC would resume home regimen of lantus 40U qd and glipizide 10mg  q am. - continue home gabapentin 300mg  BID  R foot abscess s/p irritation and debridement on POD#2 - per Ortho who is primary   HTN/HFpEF. Stable - continue home coreg 25mg  BID, lisinoprol 10mg  qd,   HLD. Stable - continue home atorvastatin 80mg  qd and asa81  Depression/Insomnia, stable - continue home prozac 40mg  qd, trazodone 100mg  qhs, tegretol 200mg  qhs  BPH - continue  home tamsulosin 0.4 mg qd  FEN/GI: carb modified diet, protonix Prophylaxis: SCDs  Disposition: DC per ortho recs  Subjective:  Patient states continued L sided CP this morning. TTP. Worse with lying flat. Never had this before. States stabbing type sensation. Denies hx of OSA or wearing CPAP at home.   Review Of Systems: Per HPI with the following additions:   Review of Systems  Constitutional: Negative for chills and fever.  Respiratory: Negative for shortness of breath.   Cardiovascular: Negative for chest pain and palpitations.  Gastrointestinal: Negative for abdominal pain, constipation, diarrhea, heartburn, nausea and vomiting.  Genitourinary: Negative for dysuria, frequency and urgency.  Musculoskeletal:       R foot pain  Neurological: Negative for dizziness and headaches.    Patient Active Problem List   Diagnosis Date Noted  . Abscess of right foot 01/19/2018  . Depression   . PAD (peripheral artery disease) ([place])   . Midfoot skin ulcer, right, limited to breakdown of skin ([place]) 08/14/2017  . Hypertension, uncontrolled 08/08/2017  . Cellulitis of foot, right 08/07/2017  . Bulging lumbar disc 07/04/2017  . Weakness of right lower extremity 05/19/2017  . Cellulitis 05/18/2017  . Achilles tendon contracture, left 01/14/2017  . Gingivitis 11/14/2016  . Achilles tendon contracture, right 09/17/2016  . Pain and swelling of left lower leg 09/16/2016  . CKD (chronic kidney disease) stage 3, GFR 30-59 ml/min (HCC) 08/27/2016  . Type 2 diabetes mellitus with stage 3 chronic kidney disease, with long-term current use of insulin ([place]) 07/22/2016  .  Constipation 07/02/2016  . S/P transmetatarsal amputation of foot, left ([place]) 07/02/2016  . Dyspnea on exertion 07/02/2016  . Diabetic gastroparesis ([place]) 05/30/2016  . Buzzing in ear, right 03/26/2016  . Joint pain 03/11/2016  . Weakness 03/08/2016  . Dizziness 03/03/2016  . Chronic diastolic CHF (congestive heart failure),  NYHA class 1 ([place]) 12/30/2015  . Diabetes mellitus type 2, uncontrolled, with complications ([place]) 60/63/0160  . Depression with anxiety 12/01/2015  . Pain in the chest 12/01/2015  . Insomnia 11/21/2015  . GERD (gastroesophageal reflux disease) 08/18/2015  . Erectile dysfunction 07/04/2015  . Symptomatic anemia 04/24/2015  . Left arm weakness 04/02/2015  . Sensory disturbance 04/02/2015  . Essential hypertension 04/02/2015  . Hemispheric carotid artery syndrome   . HLD (hyperlipidemia)   . Headache     Past Medical History: Past Medical History:  Diagnosis Date  . Anemia   . Bipolar disorder ([place])   . Cellulitis 05/2017   RIGHT FOOT   DIABETIC ULCER   . Cellulitis and abscess of left leg 06/2016  . Chest pain    a. 2015 Reportedly normal stress test in [place].  Marland Kitchen Chronic diastolic CHF (congestive heart failure) (HCC)    a.03/2015 Echo: EF 55-60%, Gr 1 DD, mild MR, triv PR.  . Depression   . Depression with anxiety   . Dyspnea    with exertion  . GERD (gastroesophageal reflux disease)   . Glaucoma   . Hyperlipidemia   . Hypertension    a. 08/2014 Admitted with hypertensive urgency.  . Insomnia   . Internal carotid artery stenosis   . Lower GI bleed 07/04/2015  . Neuropathy   . Refusal of blood transfusions as patient is Jehovah's Witness   . TIA (transient ischemic attack) 08/2014; 03/2015   a. 08/2014 in setting of hypertensive urgency.  . Type II diabetes mellitus ([place])    started insulin spring 2016, Type II  . Vitamin D deficiency spring 2016    Past Surgical History: Past Surgical History:  Procedure Laterality Date  . AMPUTATION Left 08/03/2015   Procedure: AMPUTATION LEFT GREAT TOE;  Surgeon: Newt Minion, MD;  Location: [facility];  Service: Orthopedics;  Laterality: Left;  . AMPUTATION Left 12/02/2015   Procedure: amputation of left 2nd digit  . AMPUTATION Left 04/10/2016   Procedure: Left 2nd Toe Amputation at MTP Joint;  Surgeon: Newt Minion, MD;  Location: [facility];  Service: Orthopedics;  Laterality: Left;  . AMPUTATION Left 06/09/2016   Procedure: AMPUTATION LEFT THIRD TOE;  Surgeon: [name], MD;  Location: [facility];  Service: Orthopedics;  Laterality: Left;  . AMPUTATION Left 06/26/2016   Procedure: Left Foot Transmetatarsal Amputation;  Surgeon: Newt Minion, MD;  Location: [facility];  Service: Orthopedics;  Laterality: Left;  . APPLICATION OF WOUND VAC Left 12/19/2015   Procedure: APPLICATION OF WOUND VAC;  Surgeon: Meredith Pel, MD;  Location: [facility];  Service: Orthopedics;  Laterality: Left;  . CIRCUMCISION    . COLONOSCOPY N/A 01/22/2016   Procedure: COLONOSCOPY;  Surgeon: Gatha Mayer, MD;  Location: [facility];  Service: Endoscopy;  Laterality: N/A;  . ESOPHAGOGASTRODUODENOSCOPY N/A 01/19/2016   Procedure: ESOPHAGOGASTRODUODENOSCOPY (EGD);  Surgeon: Manus Gunning, MD;  Location: [facility];  Service: Gastroenterology;  Laterality: N/A;  . ESOPHAGOGASTRODUODENOSCOPY N/A 01/22/2016   Procedure: ESOPHAGOGASTRODUODENOSCOPY (EGD);  Surgeon: Gatha Mayer, MD;  Location[facility] END[facility];  Service: Endoscopy;  Laterality: N/A;  . I&D EXTREMITY Left 12/02/2015   Procedure: IRRIGATION  AND DEBRIDEMENT OF FOOT; LEFT SECOND TOE AMPUTATION;  Surgeon: Meredith Pel, MD;  Location: [facility];  Service: Orthopedics;  Laterality: Left;  . I&D EXTREMITY Left 12/19/2015   Procedure: I & D LEFT FOOT WITH BEADS ;  Surgeon: Meredith Pel, MD;  Location: [facility];  Service: Orthopedics;  Laterality: Left;  . I&D EXTREMITY Right 01/17/2016   Procedure: IRRIGATION AND DEBRIDEMENT RIGHT FOOT;  Surgeon: Newt Minion, MD;  Location: [facility];  Service: Orthopedics;  Laterality: Right;  . I&D EXTREMITY Right 01/21/2018   Procedure: IRRIGATION AND DEBRIDEMENT RIGHT FOOT;  Surgeon: Newt Minion, MD;  Location: [facility];  Service: Orthopedics;  Laterality: Right;  . INCISION AND DRAINAGE FOOT Right 01/17/2016  . INGUINAL HERNIA REPAIR Bilateral ~ 1983- ~ 1986  .  TONSILLECTOMY  ~ 68    Social History: Social History   Tobacco Use  . Smoking status: Never Smoker  . Smokeless tobacco: Never Used  Substance Use Topics  . Alcohol use: No    Alcohol/week: 0.0 standard drinks  . Drug use: No   Additional social history: none  Please also refer to relevant sections of EMR.  Family History: Family History  Problem Relation Age of Onset  . Hypertension Mother   . Diabetes Mother   . Hyperlipidemia Mother   . Heart disease Mother        s/p pacemaker  . Diabetes Father   . Hypertension Father   . Stroke Father   . Heart attack Father        first MI @ 38.  . Depression Father   . Dementia Father   . Stroke Brother   . ADD / ADHD Brother   . Anxiety disorder Brother   . Bipolar disorder Brother   . OCD Brother   . Sexual abuse Brother     Allergies and Medications: Allergies  Allergen Reactions  . Other Diarrhea and Other (See Comments)    Red meat causes stomach pains, bloating and diarrhea  . Lactose Intolerance (Gi) Diarrhea and Other (See Comments)    Bloating   . Milk-Related Compounds Diarrhea and Other (See Comments)    Any dairy products  > lactose intolerance diarrhea and bloating   No current facility-administered medications on file prior to encounter.    Current Outpatient Medications on File Prior to Encounter  Medication Sig Dispense Refill  . acetaminophen (TYLENOL) 325 MG tablet Take 2 tablets (650 mg total) by mouth every 6 (six) hours as needed for mild pain, moderate pain or fever (or Fever >/= 101).    Marland Kitchen aspirin EC 81 MG tablet Take 1 tablet (81 mg total) by mouth daily. 30 tablet 11  . atorvastatin (LIPITOR) 80 MG tablet Take 1 tablet (80 mg total) by mouth every morning. 30 tablet 5  . carbamazepine (TEGRETOL) 200 MG tablet TAKE 1 TABLET BY MOUTH AT BEDTIME. 90 tablet 4  . carvedilol (COREG) 25 MG tablet Take 1 tablet (25 mg total) by mouth 2 (two) times daily with a meal. 60 tablet 6  . FLUoxetine  (PROZAC) 40 MG capsule Take 1 capsule (40 mg total) by mouth daily. 90 capsule 1  . folic acid (FOLVITE) 1 MG tablet Take 1 tablet (1 mg total) by mouth daily. 30 tablet 3  . gabapentin (NEURONTIN) 300 MG capsule Take 1 capsule (300 mg total) by mouth 2 (two) times daily. 60 capsule 6  . glipiZIDE (GLUCOTROL XL) 10 MG 24 hr tablet Take 1 tablet (  10 mg total) by mouth daily with breakfast. 30 tablet 6  . Insulin Glargine (LANTUS SOLOSTAR) 100 UNIT/ML Solostar Pen Inject 35 Units into the skin 2 (two) times daily. (Patient taking differently: Inject 40 Units into the skin daily. ) 30 mL 6  . lisinopril (PRINIVIL,ZESTRIL) 10 MG tablet Take 1 tablet (10 mg total) by mouth daily. 30 tablet 0  . pantoprazole (PROTONIX) 40 MG tablet Take 1 tablet (40 mg total) by mouth daily. 30 tablet 3  . polyethylene glycol (MIRALAX / GLYCOLAX) packet Take 17 g by mouth daily as needed for moderate constipation. (Patient taking differently: Take 17 g by mouth daily as needed for moderate constipation. Mix in 8 oz liquid and drink) 14 each 0  . senna-docusate (SENOKOT-S) 8.6-50 MG tablet Take 1 tablet by mouth at bedtime. (Patient taking differently: Take 1 tablet by mouth at bedtime as needed for mild constipation. ) 60 tablet 2  . silver sulfADIAZINE (SILVADENE) 1 % cream Apply topically 2 (two) times daily. Apply to wound on top of your right foot. Cover with dry dressing. 50 g 0  . sucralfate (CARAFATE) 1 g tablet TAKE 1 TABLET (1 G TOTAL) BY MOUTH 4 (FOUR) TIMES DAILY - WITH MEALS AND AT BEDTIME. 120 tablet 0  . tamsulosin (FLOMAX) 0.4 MG CAPS capsule Take 1 capsule (0.4 mg total) by mouth daily. 30 capsule 0  . traZODone (DESYREL) 100 MG tablet Take 1 tablet (100 mg total) by mouth at bedtime.    . Vitamin D, Ergocalciferol, (DRISDOL) 50000 units CAPS capsule Take 1 capsule (50,000 Units total) by mouth See admin instructions. Take one capsule (50000 units) by mouth on the 1st Sunday of each month    . Blood Glucose  Monitoring Suppl (TRUE METRIX METER) DEVI 1 each by Does not apply route 3 (three) times daily. 1 Device 0  . Elastic Bandages & Supports (T.E.D. KNEE LENGTH/S-REGULAR) MISC Wear when awake 1 each 0  . glucose blood (ACCU-CHEK AVIVA PLUS) test strip Use as instructed 100 each 12  . glucose blood (TRUE METRIX BLOOD GLUCOSE TEST) test strip 1 each by Other route 3 (three) times daily. 100 each 12    Objective: BP (!) 167/93 (BP Location: Left Arm)   Pulse 83   Temp 98.6 F (37 C) (Oral)   Resp 18   Ht 5\' 7"  (1.702 m)   Wt 91.6 kg   SpO2 93%   BMI 31.64 kg/m  Exam: General: sitting up in bed, well appearing, in NAD Eyes: EOMI, conjunctiva normal ENTM: MMM, oropharynx nonerythematous Neck: supple, normal ROM Chest: TTP left sternum, no overlying rash or wound Cardiovascular: RRR, no murmurs. No LE edema Respiratory: CTAB, normal effort on RA Gastrointestinal: soft, nontender, nondistended, + bowel sounds MSK: R foot with dressing c/d/i and tubing with serosanguineos drainage. Derm: warm and dry Neuro: alert and awake, no focal deficits Psych: appropriate affect and pleasant  Labs and Imaging: CBC BMET  Recent Labs  Lab 01/24/18 0259  WBC 9.4  HGB 7.8*  HCT 25.5*  PLT 250   Recent Labs  Lab 01/24/18 0259  NA 141  K 4.1  CL 108  CO2 26  BUN 25*  CREATININE 1.90*  GLUCOSE 123*  CALCIUM 8.2Sela Hilding, MD 01/24/2018, 6:26 AM PGY-3, [place] Intern pager: [phone], text pages welcome

## 2018-01-24 NOTE — Significant Event (Addendum)
Rapid Response Event Note  Overview: CP/HTN/HA  Initial Focused Assessment:  Called by RN about patient having high blood pressures with an acute onset of chest pain and shortness of breath. Per patient, this started around 0200 and woke him. Patient has a HA since 2300 and does endorse blurry vision (per patient, this is not new, he stated that he gets a HA and blurry vision when his BP is high). SBP in the 160s, HR in the 90s, per RN sats were in the upper 80s and patient was placed on 2L [place] and saturations are now in the upper 90s. Lung sounds were clear bilaterally with good air movement. Skin warm and slightly diaphoretic (patient was under several blankets). Endorses 8/10 CP midsternal and Right foot pain (s/p I/D). Per patient, foot pain seems to make CP worse. Patient is not visibly in acute distress. Neuro intact, able to move all extremities and sensation present in all extremities. Blood sugar was normal.  Interventions: - EKG was done prior my arrival - NSR - STAT CXR  - STAT labs ordered (CBC, BMET, Troponin) - Continuous telemetry and pulse oximetry ordered - Morphine 1mg  IV x 1 - Held off on NTG SL d/t ongoing HA. Patient received NTG previously to help with circulation in his leg and he stated that the he got NTG induced HA.  Plan of Care: - Ortho PA called, updated, will get labs and chest xray and will follow up. FMTS was consulted for DM/HTN management only earlier. For now will get diagnostics and will monitor patient. - PA ordered CTA CHEST PE, 18G PIV placed, patient still endorses chest discomfort and SOB after receiving Morphine 1mg  IV. HR and pulse ox remain stable. Will take patient down for CTA. Troponin 0.04  Event Summary:   at    Call Time 0231 Arrival Time 0234 End Time [place], [place]

## 2018-01-24 NOTE — Progress Notes (Signed)
Pt to CT for angiogram with rapid response

## 2018-01-24 NOTE — Progress Notes (Signed)
Rapid response at bedside. Teaching service paged

## 2018-01-24 NOTE — Progress Notes (Signed)
Pt complained of sharp back pain when he inhales on left side.. RN called ortho office to see who is on call and leave a message. Tried family medicine pager did not go through will try again soon.

## 2018-01-24 NOTE — Progress Notes (Signed)
CRITICAL VALUE ALERT  Critical Value: troponin 0.04  Date & Time Notied:  04:07 am  Provider Notified: rapid response  Pt is going to CT for angiogram

## 2018-01-24 NOTE — Significant Event (Addendum)
Rapid Response Event Note  Patient does appear more short of breath now, not in acute distress but labored at times. I placed a 18G PIV and patient will be going for CTA CT PE as soon as CT becomes available. Saturations are 97% on 2L [place], dyspnea noted.  Paged Ortho on call provider as well, awaiting call back to discuss labs and patient status.  Recommendation: Medicine Consult for ongoing chest discomfort and shortness of breath. Ortho PA will reach out to FMTS about ongoing medical issues. Patient still endorses 8/10 chest/pleuritic pain with inspiration.

## 2018-01-24 NOTE — Progress Notes (Signed)
Patient complained of HA and pain in foot at 2230 pt given tylenol. Pt states pain is still 10/10 and BP still elevated. On call MD paged. Pt O2 sat 89 on RA. 2 L O2 given O2 96. Will continue to monitor pt.

## 2018-01-25 LAB — CBC
HEMATOCRIT: 26.3 % — AB (ref 39.0–52.0)
HEMOGLOBIN: 8.1 g/dL — AB (ref 13.0–17.0)
MCH: 29.9 pg (ref 26.0–34.0)
MCHC: 30.8 g/dL (ref 30.0–36.0)
MCV: 97 fL (ref 78.0–100.0)
Platelets: 263 10*3/uL (ref 150–400)
RBC: 2.71 MIL/uL — ABNORMAL LOW (ref 4.22–5.81)
RDW: 13.2 % (ref 11.5–15.5)
WBC: 10 10*3/uL (ref 4.0–10.5)

## 2018-01-25 LAB — GLUCOSE, CAPILLARY
GLUCOSE-CAPILLARY: 201 mg/dL — AB (ref 70–99)
Glucose-Capillary: 128 mg/dL — ABNORMAL HIGH (ref 70–99)
Glucose-Capillary: 147 mg/dL — ABNORMAL HIGH (ref 70–99)
Glucose-Capillary: 134 mg/dL — ABNORMAL HIGH (ref 70–99)

## 2018-01-25 LAB — BASIC METABOLIC PANEL
ANION GAP: 7 (ref 5–15)
BUN: 19 mg/dL (ref 6–20)
CHLORIDE: 107 mmol/L (ref 98–111)
CO2: 29 mmol/L (ref 22–32)
Calcium: 8.3 mg/dL — ABNORMAL LOW (ref 8.9–10.3)
Creatinine, Ser: 1.48 mg/dL — ABNORMAL HIGH (ref 0.61–1.24)
GFR calc Af Amer: >60 mL/min (ref >60)
GFR, EST NON AFRICAN AMERICAN: 54 mL/min — AB (ref >60)
Glucose, Bld: 160 mg/dL — ABNORMAL HIGH (ref 70–99)
POTASSIUM: 4.2 mmol/L (ref 3.5–5.1)
SODIUM: 143 mmol/L (ref 135–145)

## 2018-01-25 LAB — TROPONIN I

## 2018-01-25 NOTE — Progress Notes (Signed)
Subjective: 4 Days Post-Op Procedure(s) (LRB): IRRIGATION AND DEBRIDEMENT RIGHT FOOT (Right) Patient reports pain as mild.  Feeling better this am.  Multiple episodes of chest pain/dyspnea over the weekend.  fm consulted Friday night/sat early am.  Negative for cardiac abnormalities or PE.  Appears to be anxiety related.  Objective: Vital signs in last 24 hours: Temp:  [97.9 F (36.6 C)-98.7 F (37.1 C)] 97.9 F (36.6 C) (08/11 0506) Pulse Rate:  [87-102] 87 (08/11 0506) Resp:  [16-18] 18 (08/10 2000) BP: (137-174)/(76-104) 141/85 (08/11 0832) SpO2:  [94 %-100 %] 94 % (08/11 0630)  Intake/Output from previous day: 08/10 0701 - 08/11 0700 In: 480 [P.O.:480] Out: 1000 [Urine:1000] Intake/Output this shift: No intake/output data recorded.  Recent Labs    01/24/18 0259 01/25/18 0418  HGB 7.8* 8.1*   Recent Labs    01/24/18 0259 01/25/18 0418  WBC 9.4 10.0  RBC 2.61* 2.71*  HCT 25.5* 26.3*  PLT 250 263   Recent Labs    01/24/18 0259 01/25/18 0418  NA 141 143  K 4.1 4.2  CL 108 107  CO2 26 29  BUN 25* 19  CREATININE 1.90* 1.48*  GLUCOSE 123* 160*  CALCIUM 8.2* 8.3*   No results for input(s): LABPT, INR in the last 72 hours.  Neurologically intact Compartment soft  Wound vac in place to RLE- mostly blood in canister Calf is soft and NT    Assessment/Plan: 4 Days Post-Op Procedure(s) (LRB): IRRIGATION AND DEBRIDEMENT RIGHT FOOT (Right) Advance diet Plan for discharge tomorrow  NWB RLE Will keep one more night for observation Continue wound vac    Aundra Dubin 01/25/2018, 8:39 AM

## 2018-01-25 NOTE — Consult Note (Addendum)
Family Medicine Teaching Service Consult Note Service Pager: [phone]  Patient name: Shane [name] Medical record number: [id number] Date of birth: 12-08-1970 Age: 47 y.o. Gender: male  Primary Care Provider: Charlott Rakes, MD  Chief Complaint: R foot cellulitis and ulcer. FPTS consulted for diabetes management   Assessment and Plan: Shane [name] is a 47 y.o. male presenting with R foot cellulitis and ulcer . PMH is significant for T2DM, PDA, depression, insomnia, CKD, HTN.  R foot abscess s/p irritation and debridement on POD#3 - per Ortho who is primary  Chest pain, SOB: Overnight, has recurrent episode of CPx1. Improved w/ ambien. pt was on O2 for SOB during CP, sats in high 90s on 2L. Troponins down trended 0.05>> <0.03. Remainder of work up negative for cardiac abnormalities. CTA neg for PE.  Likely anxiety related. O/n  W/ adequate sats on RA. Likely related to anxiety.  -Wean pt to RA - cont to monitor - IS  Acute on chronic CKD III, improved Pt Cr at baseline 1.48 - hold nephrotoxic agents  T2DM with peripheral neuropathy, stable CBGs past 24 100-160.  - currently on glipizide 10mg  q am and sSSI - continue home gabapentin 300mg  BID   HTN BP 150-170/80-90 - currently taking coreg 25mg  BID, amlodipine 10 mg (started on 8/10), hydral prn - cont. Current regimen and allow amlodipine to reach peak effectiveness  FEN/GI: carb modified diet, protonix Prophylaxis: SCDs  Disposition: DC per ortho recs  Subjective:  Pt feels no CP this morning. SOB improved on oxygen. Denies cough, pain, abdominal discomfort.    Patient Active Problem List   Diagnosis Date Noted  . Hypoxia   . Abscess of right foot 01/19/2018  . Depression   . PAD (peripheral artery disease) ([place])   . Midfoot skin ulcer, right, limited to breakdown of skin ([place]) 08/14/2017  . Hypertension, uncontrolled 08/08/2017  . Cellulitis of foot, right 08/07/2017  . Bulging lumbar disc 07/04/2017  . Weakness  of right lower extremity 05/19/2017  . Cellulitis 05/18/2017  . Achilles tendon contracture, left 01/14/2017  . Gingivitis 11/14/2016  . Achilles tendon contracture, right 09/17/2016  . Pain and swelling of left lower leg 09/16/2016  . CKD (chronic kidney disease) stage 3, GFR 30-59 ml/min (HCC) 08/27/2016  . Type 2 diabetes mellitus without complication, with long-term current use of insulin ([place]) 07/22/2016  . Constipation 07/02/2016  . S/P transmetatarsal amputation of foot, left ([place]) 07/02/2016  . Shortness of breath 07/02/2016  . Diabetic gastroparesis ([place]) 05/30/2016  . Buzzing in ear, right 03/26/2016  . Joint pain 03/11/2016  . Weakness 03/08/2016  . Dizziness 03/03/2016  . Chronic diastolic CHF (congestive heart failure), NYHA class 1 ([place]) 12/30/2015  . Diabetes mellitus type 2, uncontrolled, with complications ([place]) 54/65/6812  . Depression with anxiety 12/01/2015  . Pain in the chest 12/01/2015  . Insomnia 11/21/2015  . GERD (gastroesophageal reflux disease) 08/18/2015  . Erectile dysfunction 07/04/2015  . Symptomatic anemia 04/24/2015  . AKI (acute kidney injury) ([place]) 04/24/2015  . Left arm weakness 04/02/2015  . Sensory disturbance 04/02/2015  . Essential hypertension 04/02/2015  . Hemispheric carotid artery syndrome   . HLD (hyperlipidemia)   . Headache     Past Medical History: Past Medical History:  Diagnosis Date  . Anemia   . Bipolar disorder ([place])   . Cellulitis 05/2017   RIGHT FOOT   DIABETIC ULCER   . Cellulitis and abscess of left leg 06/2016  . Chest pain    a. 2015  Reportedly normal stress test in [place].  Marland Kitchen Chronic diastolic CHF (congestive heart failure) (HCC)    a.03/2015 Echo: EF 55-60%, Gr 1 DD, mild MR, triv PR.  . Depression   . Depression with anxiety   . Dyspnea    with exertion  . GERD (gastroesophageal reflux disease)   . Glaucoma   . Hyperlipidemia   . Hypertension    a. 08/2014 Admitted with hypertensive urgency.  . Insomnia    . Internal carotid artery stenosis   . Lower GI bleed 07/04/2015  . Neuropathy   . Refusal of blood transfusions as patient is Jehovah's Witness   . TIA (transient ischemic attack) 08/2014; 03/2015   a. 08/2014 in setting of hypertensive urgency.  . Type II diabetes mellitus ([place])    started insulin spring 2016, Type II  . Vitamin D deficiency spring 2016    Past Surgical History: Past Surgical History:  Procedure Laterality Date  . AMPUTATION Left 08/03/2015   Procedure: AMPUTATION LEFT GREAT TOE;  Surgeon: Newt Minion, MD;  Location: [facility];  Service: Orthopedics;  Laterality: Left;  . AMPUTATION Left 12/02/2015   Procedure: amputation of left 2nd digit  . AMPUTATION Left 04/10/2016   Procedure: Left 2nd Toe Amputation at MTP Joint;  Surgeon: Newt Minion, MD;  Location: [facility];  Service: Orthopedics;  Laterality: Left;  . AMPUTATION Left 06/09/2016   Procedure: AMPUTATION LEFT THIRD TOE;  Surgeon: [name], MD;  Location: [facility];  Service: Orthopedics;  Laterality: Left;  . AMPUTATION Left 06/26/2016   Procedure: Left Foot Transmetatarsal Amputation;  Surgeon: Newt Minion, MD;  Location: [facility];  Service: Orthopedics;  Laterality: Left;  . APPLICATION OF WOUND VAC Left 12/19/2015   Procedure: APPLICATION OF WOUND VAC;  Surgeon: Meredith Pel, MD;  Location: [facility];  Service: Orthopedics;  Laterality: Left;  . CIRCUMCISION    . COLONOSCOPY N/A 01/22/2016   Procedure: COLONOSCOPY;  Surgeon: Gatha Mayer, MD;  Location: [facility];  Service: Endoscopy;  Laterality: N/A;  . ESOPHAGOGASTRODUODENOSCOPY N/A 01/19/2016   Procedure: ESOPHAGOGASTRODUODENOSCOPY (EGD);  Surgeon: Manus Gunning, MD;  Location: [facility];  Service: Gastroenterology;  Laterality: N/A;  . ESOPHAGOGASTRODUODENOSCOPY N/A 01/22/2016   Procedure: ESOPHAGOGASTRODUODENOSCOPY (EGD);  Surgeon: Gatha Mayer, MD;  Location: [facility];  Service: Endoscopy;  Laterality: N/A;  . I&D EXTREMITY Left 12/02/2015    Procedure: IRRIGATION AND DEBRIDEMENT OF FOOT; LEFT SECOND TOE AMPUTATION;  Surgeon: Meredith Pel, MD;  Location: [facility];  Service: Orthopedics;  Laterality: Left;  . I&D EXTREMITY Left 12/19/2015   Procedure: I & D LEFT FOOT WITH BEADS ;  Surgeon: Meredith Pel, MD;  Location: [facility];  Service: Orthopedics;  Laterality: Left;  . I&D EXTREMITY Right 01/17/2016   Procedure: IRRIGATION AND DEBRIDEMENT RIGHT FOOT;  Surgeon: Newt Minion, MD;  Location: [facility];  Service: Orthopedics;  Laterality: Right;  . I&D EXTREMITY Right 01/21/2018   Procedure: IRRIGATION AND DEBRIDEMENT RIGHT FOOT;  Surgeon: Newt Minion, MD;  Location: [facility];  Service: Orthopedics;  Laterality: Right;  . INCISION AND DRAINAGE FOOT Right 01/17/2016  . INGUINAL HERNIA REPAIR Bilateral ~ 1983- ~ 1986  . TONSILLECTOMY  ~ 69    Social History: Social History   Tobacco Use  . Smoking status: Never Smoker  . Smokeless tobacco: Never Used  Substance Use Topics  . Alcohol use: No    Alcohol/week: 0.0 standard drinks  . Drug use: No  Additional social history: none  Please also refer to relevant sections of EMR.  Family History: Family History  Problem Relation Age of Onset  . Hypertension Mother   . Diabetes Mother   . Hyperlipidemia Mother   . Heart disease Mother        s/p pacemaker  . Diabetes Father   . Hypertension Father   . Stroke Father   . Heart attack Father        first MI @ 72.  . Depression Father   . Dementia Father   . Stroke Brother   . ADD / ADHD Brother   . Anxiety disorder Brother   . Bipolar disorder Brother   . OCD Brother   . Sexual abuse Brother     Allergies and Medications: Allergies  Allergen Reactions  . Other Diarrhea and Other (See Comments)    Red meat causes stomach pains, bloating and diarrhea  . Lactose Intolerance (Gi) Diarrhea and Other (See Comments)    Bloating   . Milk-Related Compounds Diarrhea and Other (See Comments)    Any dairy products  >  lactose intolerance diarrhea and bloating   No current facility-administered medications on file prior to encounter.    Current Outpatient Medications on File Prior to Encounter  Medication Sig Dispense Refill  . acetaminophen (TYLENOL) 325 MG tablet Take 2 tablets (650 mg total) by mouth every 6 (six) hours as needed for mild pain, moderate pain or fever (or Fever >/= 101).    Marland Kitchen aspirin EC 81 MG tablet Take 1 tablet (81 mg total) by mouth daily. 30 tablet 11  . atorvastatin (LIPITOR) 80 MG tablet Take 1 tablet (80 mg total) by mouth every morning. 30 tablet 5  . carbamazepine (TEGRETOL) 200 MG tablet TAKE 1 TABLET BY MOUTH AT BEDTIME. 90 tablet 4  . carvedilol (COREG) 25 MG tablet Take 1 tablet (25 mg total) by mouth 2 (two) times daily with a meal. 60 tablet 6  . FLUoxetine (PROZAC) 40 MG capsule Take 1 capsule (40 mg total) by mouth daily. 90 capsule 1  . folic acid (FOLVITE) 1 MG tablet Take 1 tablet (1 mg total) by mouth daily. 30 tablet 3  . gabapentin (NEURONTIN) 300 MG capsule Take 1 capsule (300 mg total) by mouth 2 (two) times daily. 60 capsule 6  . glipiZIDE (GLUCOTROL XL) 10 MG 24 hr tablet Take 1 tablet (10 mg total) by mouth daily with breakfast. 30 tablet 6  . Insulin Glargine (LANTUS SOLOSTAR) 100 UNIT/ML Solostar Pen Inject 35 Units into the skin 2 (two) times daily. (Patient taking differently: Inject 40 Units into the skin daily. ) 30 mL 6  . lisinopril (PRINIVIL,ZESTRIL) 10 MG tablet Take 1 tablet (10 mg total) by mouth daily. 30 tablet 0  . pantoprazole (PROTONIX) 40 MG tablet Take 1 tablet (40 mg total) by mouth daily. 30 tablet 3  . polyethylene glycol (MIRALAX / GLYCOLAX) packet Take 17 g by mouth daily as needed for moderate constipation. (Patient taking differently: Take 17 g by mouth daily as needed for moderate constipation. Mix in 8 oz liquid and drink) 14 each 0  . senna-docusate (SENOKOT-S) 8.6-50 MG tablet Take 1 tablet by mouth at bedtime. (Patient taking  differently: Take 1 tablet by mouth at bedtime as needed for mild constipation. ) 60 tablet 2  . silver sulfADIAZINE (SILVADENE) 1 % cream Apply topically 2 (two) times daily. Apply to wound on top of your right foot. Cover with dry dressing. [place]  g 0  . sucralfate (CARAFATE) 1 g tablet TAKE 1 TABLET (1 G TOTAL) BY MOUTH 4 (FOUR) TIMES DAILY - WITH MEALS AND AT BEDTIME. 120 tablet 0  . tamsulosin (FLOMAX) 0.4 MG CAPS capsule Take 1 capsule (0.4 mg total) by mouth daily. 30 capsule 0  . traZODone (DESYREL) 100 MG tablet Take 1 tablet (100 mg total) by mouth at bedtime.    . Vitamin D, Ergocalciferol, (DRISDOL) 50000 units CAPS capsule Take 1 capsule (50,000 Units total) by mouth See admin instructions. Take one capsule (50000 units) by mouth on the 1st Sunday of each month    . Blood Glucose Monitoring Suppl (TRUE METRIX METER) DEVI 1 each by Does not apply route 3 (three) times daily. 1 Device 0  . Elastic Bandages & Supports (T.E.D. KNEE LENGTH/S-REGULAR) MISC Wear when awake 1 each 0  . glucose blood (ACCU-CHEK AVIVA PLUS) test strip Use as instructed 100 each 12  . glucose blood (TRUE METRIX BLOOD GLUCOSE TEST) test strip 1 each by Other route 3 (three) times daily. 100 each 12    Objective: BP (!) 151/93 (BP Location: Left Arm)   Pulse 87   Temp 97.9 F (36.6 C) (Oral)   Resp 18   Ht 5\' 7"  (1.702 m)   Wt 91.6 kg   SpO2 98%   BMI 31.64 kg/m  Exam: General:lying in bed, NAD, ENTM: MMM, oropharynx nonerythematous Cardiovascular: RRR, no murmurs. No LE edema, no tenderness Respiratory: crackles in bases of lungs bilaterally, normal effort on RA Gastrointestinal: soft, nontender, nondistended, + bowel sounds MSK: R foot with dressing c/d/i and tubing with serosanguineos drainage. Derm: warm and dry Neuro: alert and awake, no focal deficits Psych: appropriate affect and pleasant  Labs and Imaging: CBC BMET  Recent Labs  Lab 01/25/18 0418  WBC 10.0  HGB 8.1*  HCT 26.3*  PLT 263    Recent Labs  Lab 01/25/18 0418  NA 143  K 4.2  CL 107  CO2 29  BUN 19  CREATININE 1.48*  GLUCOSE 160*  CALCIUM 8.3Bonnita Hollow, MD 01/25/2018, 5:16 AM PGY-2, [place] Intern pager: [phone], text pages welcome

## 2018-01-25 NOTE — Progress Notes (Signed)
Pt complains of chest pain in the middle of his chest that radiates down his back. BP 171/93 hydralazine given and BP rechecked 30 min after and BP 174/104. Pt does seem anxious. SPO2 98 P-104. MD paged and made aware instructed to give prn ambien. BP rechecked 154/95 p 97. Will continue to monitor pt.

## 2018-01-26 ENCOUNTER — Telehealth (INDEPENDENT_AMBULATORY_CARE_PROVIDER_SITE_OTHER): Payer: Self-pay | Admitting: Orthopedic Surgery

## 2018-01-26 ENCOUNTER — Encounter (HOSPITAL_COMMUNITY): Payer: Self-pay | Admitting: *Deleted

## 2018-01-26 LAB — GLUCOSE, CAPILLARY
GLUCOSE-CAPILLARY: 191 mg/dL — AB (ref 70–99)
Glucose-Capillary: 148 mg/dL — ABNORMAL HIGH (ref 70–99)

## 2018-01-26 NOTE — Telephone Encounter (Signed)
Called and sw Elmo Putt to advise that Dr. Sharol Given has already sent order. She states they received it and pt has left.

## 2018-01-26 NOTE — Care Management Note (Addendum)
Case Management Note  Patient Details  Name: Shane [name] MRN: [id number] Date of Birth: 1970/11/18  Subjective/Objective:   Right foot wound                 Action/Plan:  NCM spoke to pt and active with [facility]. Contacted [facility] for [facility] and add [facility]. Also a manual wheelchair. Pt states he has RW with seat at home. Wife at home to assist with care. Pt plans to arrange appt with new PCP. Has follow up with [facility] on 02/25/2018. Pt declined to purchase RW out of pocket. States he will purchase one at medical supply store.    Expected Discharge Date:  01/26/18               Expected Discharge Plan:  [place]  In-House Referral:  NA  Discharge planning Services  CM Consult  Post Acute Care Choice:  Home Health, Resumption of Svcs/PTA Provider Choice offered to:  Patient  DME Arranged:  [employer] wheelchair with seat cushion DME Agency:  [place]:  RN, PT [facility] Agency:     Status of Service:  Completed, signed off  If discussed at [place] of Stay Meetings, dates discussed:    Additional Comments:  Erenest Rasher, RN 01/26/2018, 10:27 AM

## 2018-01-26 NOTE — Telephone Encounter (Signed)
Can you make appt with me for Friday morning please? thanks

## 2018-01-26 NOTE — Care Management Important Message (Signed)
Important Message  Patient Details  Name: Shane [name] MRN: [id number] Date of Birth: 03-06-1971   Medicare Important Message Given:  Yes    Erenest Rasher, RN 01/26/2018, 10:28 AM

## 2018-01-26 NOTE — Progress Notes (Signed)
Physical Therapy Wheelchair Narrative  Patient is s/p I&D of R foot and is currently TDWB on the RLE which impairs their ability to perform daily activities like safely ambulating in the home.  A walker alone will not resolve the issues with performing activities of daily living. A wheelchair will allow patient to safely perform daily activities.  The patient can self propel in the home or has a caregiver who can provide assistance.  Required accessories include elevating leg rest on the right.   If questions regarding this patient, please page me.  Rolinda Roan, PT, DPT Acute Rehabilitation Services Pager: [phone]

## 2018-01-26 NOTE — Discharge Summary (Signed)
Discharge Diagnoses:  Active Problems:   AKI (acute kidney injury) ([facility])   Shortness of breath   Type 2 diabetes mellitus without complication, with long-term current use of insulin ([place])   Abscess of right foot   Hypoxia   Surgeries: Procedure(s): IRRIGATION AND DEBRIDEMENT RIGHT FOOT on 01/21/2018    Consultants:   Discharged Condition: Improved  Hospital Course: Shane [name] is an 47 y.o. male who was admitted 01/19/2018 with a chief complaint of abscess right foot, with a final diagnosis of Abscess Right Foot.  Patient was brought to the operating room on 01/21/2018 and underwent Procedure(s): IRRIGATION AND DEBRIDEMENT RIGHT FOOT.    Patient was given perioperative antibiotics:  Anti-infectives (From admission, onward)   Start     Dose/Rate Route Frequency Ordered Stop   01/23/18 1300  doxycycline (VIBRA-TABS) tablet 100 mg     100 mg Oral Every 12 hours 01/23/18 1257     01/23/18 0000  doxycycline (VIBRA-TABS) 100 MG tablet     100 mg Oral Every 12 hours 01/23/18 1058 01/30/18 2359   01/21/18 1100  ceFAZolin (ANCEF) IVPB 2g/100 mL premix     2 g 200 mL/hr over 30 Minutes Intravenous On call to O.R. 01/21/18 1046 01/21/18 1257   01/20/18 0200  vancomycin (VANCOCIN) IVPB 1000 mg/200 mL premix  Status:  Discontinued     1,000 mg 200 mL/hr over 60 Minutes Intravenous Every 12 hours 01/19/18 1300 01/19/18 1300   01/20/18 0200  vancomycin (VANCOCIN) 1,250 mg in sodium chloride 0.9 % 250 mL IVPB  Status:  Discontinued     1,250 mg 166.7 mL/hr over 90 Minutes Intravenous Every 12 hours 01/19/18 1300 01/23/18 1257   01/19/18 1800  piperacillin-tazobactam (ZOSYN) IVPB 3.375 g  Status:  Discontinued     3.375 g 12.5 mL/hr over 240 Minutes Intravenous Every 8 hours 01/19/18 1300 01/23/18 1257   01/19/18 1115  vancomycin (VANCOCIN) 1,750 mg in sodium chloride 0.9 % 500 mL IVPB     1,750 mg 250 mL/hr over 120 Minutes Intravenous  Once 01/19/18 1110 01/19/18 1815   01/19/18 1115   piperacillin-tazobactam (ZOSYN) IVPB 3.375 g     3.375 g 100 mL/hr over 30 Minutes Intravenous  Once 01/19/18 1110 01/19/18 1315    .  Patient was given sequential compression devices, early ambulation, and aspirin for DVT prophylaxis.  Recent vital signs:  Patient Vitals for the past 24 hrs:  BP Temp Temp src Pulse Resp SpO2  01/26/18 0509 - - - - - 94 %  01/26/18 0500 - - - - - (!) 89 %  01/26/18 0422 (!) 154/94 98.3 F (36.8 C) Oral 98 - 97 %  01/25/18 1948 131/84 98 F (36.7 C) Oral 84 - 94 %  01/25/18 1415 (!) 146/89 97.8 F (36.6 C) Oral 78 16 95 %  01/25/18 0832 (!) 141/85 - - - - -  .  Recent laboratory studies: No results found.  Discharge Medications:   Allergies as of 01/26/2018      Reactions   Other Diarrhea, Other (See Comments)   Red meat causes stomach pains, bloating and diarrhea   Lactose Intolerance (gi) Diarrhea, Other (See Comments)   Bloating    Milk-related Compounds Diarrhea, Other (See Comments)   Any dairy products  > lactose intolerance diarrhea and bloating      Medication List    STOP taking these medications   glucose blood test strip   silver sulfADIAZINE 1 % cream Commonly known as:  SILVADENE   T.E.D. KNEE LENGTH/S-REGULAR Misc   TRUE METRIX METER Devi     TAKE these medications   acetaminophen 325 MG tablet Commonly known as:  TYLENOL Take 2 tablets (650 mg total) by mouth every 6 (six) hours as needed for mild pain, moderate pain or fever (or Fever >/= 101).   aspirin EC 81 MG tablet Take 1 tablet (81 mg total) by mouth daily.   atorvastatin 80 MG tablet Commonly known as:  LIPITOR Take 1 tablet (80 mg total) by mouth every morning.   carbamazepine 200 MG tablet Commonly known as:  TEGRETOL TAKE 1 TABLET BY MOUTH AT BEDTIME.   carvedilol 25 MG tablet Commonly known as:  COREG Take 1 tablet (25 mg total) by mouth 2 (two) times daily with a meal.   doxycycline 100 MG tablet Commonly known as:  VIBRA-TABS Take 1  tablet (100 mg total) by mouth every 12 (twelve) hours for 7 days.   FLUoxetine 40 MG capsule Commonly known as:  PROZAC Take 1 capsule (40 mg total) by mouth daily.   folic acid 1 MG tablet Commonly known as:  FOLVITE Take 1 tablet (1 mg total) by mouth daily.   gabapentin 300 MG capsule Commonly known as:  NEURONTIN Take 1 capsule (300 mg total) by mouth 2 (two) times daily.   glipiZIDE 10 MG 24 hr tablet Commonly known as:  GLUCOTROL XL Take 1 tablet (10 mg total) by mouth daily with breakfast.   Insulin Glargine 100 UNIT/ML Solostar Pen Commonly known as:  LANTUS Inject 35 Units into the skin 2 (two) times daily. What changed:    how much to take  when to take this   lisinopril 10 MG tablet Commonly known as:  PRINIVIL,ZESTRIL Take 1 tablet (10 mg total) by mouth daily.   methocarbamol 500 MG tablet Commonly known as:  ROBAXIN Take 1 tablet (500 mg total) by mouth every 6 (six) hours as needed for muscle spasms.   oxyCODONE 5 MG immediate release tablet Commonly known as:  Oxy IR/ROXICODONE Take 1-2 tablets (5-10 mg total) by mouth every 4 (four) hours as needed for moderate pain (pain score 4-6).   pantoprazole 40 MG tablet Commonly known as:  PROTONIX Take 1 tablet (40 mg total) by mouth daily.   polyethylene glycol packet Commonly known as:  MIRALAX / GLYCOLAX Take 17 g by mouth daily as needed for moderate constipation. What changed:  additional instructions   senna-docusate 8.6-50 MG tablet Commonly known as:  Senokot-S Take 1 tablet by mouth at bedtime. What changed:    when to take this  reasons to take this   sucralfate 1 g tablet Commonly known as:  CARAFATE TAKE 1 TABLET (1 G TOTAL) BY MOUTH 4 (FOUR) TIMES DAILY - WITH MEALS AND AT BEDTIME.   tamsulosin 0.4 MG Caps capsule Commonly known as:  FLOMAX Take 1 capsule (0.4 mg total) by mouth daily.   traZODone 100 MG tablet Commonly known as:  DESYREL Take 1 tablet (100 mg total) by mouth  at bedtime.   Vitamin D (Ergocalciferol) 50000 units Caps capsule Commonly known as:  DRISDOL Take 1 capsule (50,000 Units total) by mouth See admin instructions. Take one capsule (50000 units) by mouth on the 1st Sunday of each month            Discharge Care Instructions  (From admission, onward)         Start     Ordered   01/21/18 0000  Touch down weight bearing     01/21/18 1314          Diagnostic Studies: Ct Angio Chest Pe W Or Wo Contrast  Result Date: 01/24/2018 CLINICAL DATA:  Acute onset of generalized chest pain and shortness of breath. High blood pressure. EXAM: CT ANGIOGRAPHY CHEST WITH CONTRAST TECHNIQUE: Multidetector CT imaging of the chest was performed using the standard protocol during bolus administration of intravenous contrast. Multiplanar CT image reconstructions and MIPs were obtained to evaluate the vascular anatomy. CONTRAST:  27mL ISOVUE-370 IOPAMIDOL (ISOVUE-370) INJECTION 76% COMPARISON:  Chest radiograph performed earlier today at 3:03 a.m., and CTA of the chest performed 06/27/2016 FINDINGS: Cardiovascular:  There is no evidence of pulmonary embolus. The heart is normal in size. Scattered coronary artery calcifications are seen. The thoracic aorta is grossly unremarkable. The great vessels are within normal limits. Mediastinum/Nodes: The mediastinum is unremarkable in appearance. No mediastinal lymphadenopathy is seen. No pericardial effusion is identified. The thyroid gland is unremarkable. No axillary lymphadenopathy is seen. Lungs/Pleura: Trace bilateral pleural fluid is noted, with bibasilar airspace opacities likely reflecting atelectasis. No pneumothorax is seen. No masses are identified. Upper Abdomen: The visualized portions of the liver and spleen are unremarkable. The gallbladder is unremarkable in appearance. The visualized portions of the pancreas, adrenal glands and kidneys are grossly unremarkable. Nonspecific perinephric stranding is noted  bilaterally. Musculoskeletal: No acute osseous abnormalities are identified. The visualized musculature is unremarkable in appearance. Review of the MIP images confirms the above findings. IMPRESSION: 1. No evidence of pulmonary embolus. 2. Trace bilateral pleural fluid, with bibasilar airspace opacities likely reflecting atelectasis. 3. Scattered coronary artery calcifications seen. Electronically Signed   By: Garald Balding M.D.   On: 01/24/2018 05:24   Mr Foot Right W Wo Contrast  Result Date: 01/12/2018 CLINICAL DATA:  Diabetic patient with a 3 cm in diameter ulcer on the dorsal surface of the right foot and a second linear ulceration along the right great toe. EXAM: MRI OF THE RIGHT FOREFOOT WITHOUT AND WITH CONTRAST TECHNIQUE: Multiplanar, multisequence MR imaging of the right forefoot was performed before and after the administration of intravenous contrast. CONTRAST:  19 ml MULTIHANCE GADOBENATE DIMEGLUMINE 529 MG/ML IV SOLN COMPARISON:  Plain films right foot 01/10/2018. MRI right foot 08/08/2017. FINDINGS: Bones/Joint/Cartilage There is no bone marrow edema or enhancement to suggest osteomyelitis. No fracture, stress change or worrisome lesion is identified. Ligaments Intact. Muscles and Tendons No intramuscular fluid collection.  No muscle or tendon tear. Soft tissues Superficial appearing skin ulceration is seen on the dorsum of the foot centered over the distal diaphysis of the second metatarsal. A second superficial ulcer is seen along the dorsal, medial margin of the first MTP joint. There is some soft tissue edema and enhancement about these ulcers but no rim enhancing fluid collection to suggest abscess. IMPRESSION: 2 skin ulcerations are seen on the foot without abscess, osteomyelitis or septic joint. Electronically Signed   By: Inge Rise M.D.   On: 01/12/2018 14:47   Dg Chest Port 1 View  Result Date: 01/24/2018 CLINICAL DATA:  Acute onset chest pain with dyspnea. Patient had  surgery on Wednesday. EXAM: PORTABLE CHEST 1 VIEW COMPARISON:  07/12/2016 FINDINGS: Low lung volumes with platelike atelectasis and/or scarring at the left lung base. Otherwise, no pulmonary consolidation, effusion or pneumothorax. Mild peribronchial thickening and increased interstitial lung markings likely reflecting chronic bronchitic change. No acute osseous abnormality. IMPRESSION: 1. Low lung volumes with crowding of interstitial lung markings. 2. Slight increase  in interstitial lung markings with peribronchial thickening may represent chronic bronchitic change. Electronically Signed   By: Ashley Royalty M.D.   On: 01/24/2018 03:23   Dg Foot Complete Right  Result Date: 01/10/2018 CLINICAL DATA:  Left foot open wound for 1 week. EXAM: RIGHT FOOT COMPLETE - 3+ VIEW COMPARISON:  None. FINDINGS: There is no evidence of fracture or dislocation. There is no evidence of arthropathy or other focal bone abnormality. No osteolysis. IMPRESSION: No evidence of active osteitis. Electronically Signed   By: Ulyses Jarred M.D.   On: 01/10/2018 17:04    Patient benefited maximally from their hospital stay and there were no complications.     Disposition: Discharge disposition: 01-Home or Self Care      Discharge Instructions    Call MD / Call 911   Complete by:  As directed    If you experience chest pain or shortness of breath, CALL 911 and be transported to the hospital emergency room.  If you develope a fever above 101 F, pus (white drainage) or increased drainage or redness at the wound, or calf pain, call your surgeon's office.   Constipation Prevention   Complete by:  As directed    Drink plenty of fluids.  Prune juice may be helpful.  You may use a stool softener, such as Colace (over the counter) 100 mg twice a day.  Use MiraLax (over the counter) for constipation as needed.   Diet - low sodium heart healthy   Complete by:  As directed    Increase activity slowly as tolerated   Complete by:  As  directed    Negative Pressure Wound Therapy - Incisional   Complete by:  As directed    Negative Pressure Wound Therapy - Incisional   Complete by:  As directed    Negative Pressure Wound Therapy - Incisional   Complete by:  As directed    Touch down weight bearing   Complete by:  As directed      Follow-up Information    Newt Minion, MD In 1 week.   Specialty:  Orthopedic Surgery Contact information: [address] [place] [address] [phone]            Signed: Newt Minion 01/26/2018, 7:02 AM

## 2018-01-26 NOTE — Telephone Encounter (Signed)
Patient scheduled at 10 on Friday with you, thanks!

## 2018-01-26 NOTE — Progress Notes (Signed)
RN gave pt discharge instructions pt stated understanding he did not have any questions or concerns. He was not in any pain waiting for his wife to arrive. IV has been removed.

## 2018-01-26 NOTE — Progress Notes (Signed)
Physical Therapy Treatment Patient Details Name: Shane [name] MRN: [id number] DOB: [birth year] Today's Date: 01/26/2018    History of Present Illness Pt is a 47 y/o male s/p I&D R foot wound with wound VAC application. PMH including but not limited to Bipolar disorder, HTN, CHF and L transmet amputation in 2018.    PT Comments    Pt continues to make steady progress towards goals, performing transfers and ambulation with min guard. Pt continues to need VCs to maintain TWB precautions with gait. Pt remains limited in longer distances secondary to pain in R foot and fatigue, requiring chair ride back to room. Pt performed therapeutic exercises in recliner without any reports of increase in pain. Discussed wheelchair order with pt to assist with energy conservation with longer distances at d/c, and RW instead of rollator for short distances. Patient will benefit from skilled PT to increase their independence and safety with mobility to allow discharge to the venue listed below.      Follow Up Recommendations  Home health PT;Supervision/Assistance - 24 hour     Equipment Recommendations  Rolling walker with 5" wheels;Wheelchair (measurements PT);Wheelchair cushion (measurements PT)    Recommendations for Other Services       Precautions / Restrictions Precautions Precautions: Fall Precaution Comments: wound VAC Restrictions Weight Bearing Restrictions: Yes RLE Weight Bearing: Touchdown weight bearing    Mobility  Bed Mobility Overal bed mobility: Needs Assistance Bed Mobility: Supine to Sit     Supine to sit: Supervision     General bed mobility comments: Pt required increased time and effort secondary to pain and wound vac management  Transfers Overall transfer level: Needs assistance Equipment used: Rolling walker (2 wheeled) Transfers: Sit to/from Stand Sit to Stand: Min guard;From elevated surface         General transfer comment: Pt demonstrated safe hand  placement with RW; bed elevated to stimulate home environment; min cues for placing R foot out farther than left upon standing to adhere to TWB precaution  Ambulation/Gait Ambulation/Gait assistance: Min guard Gait Distance (Feet): 30 Feet Assistive device: Rolling walker (2 wheeled) Gait Pattern/deviations: (hop-to L LE ) Gait velocity: decreased   General Gait Details: Pt required VCs to maintain TWB R LE; also instructed to push RW instead of lifting with each hop to conserve energy; pt required increased time with ambulation.    Stairs             Wheelchair Mobility    Modified Rankin (Stroke Patients Only)       Balance Overall balance assessment: Needs assistance Sitting-balance support: No upper extremity supported Sitting balance-Leahy Scale: Good     Standing balance support: Bilateral upper extremity supported Standing balance-Leahy Scale: Poor Standing balance comment:  Pt requires bil UE support with dynamic standing activity to maintain weight bearing precautions                     Cognition Arousal/Alertness: Awake/alert Behavior During Therapy: WFL for tasks assessed/performed Overall Cognitive Status: Within Functional Limits for tasks assessed                                        Exercises General Exercises - Lower Extremity Quad Sets: AROM;Strengthening;Right;10 reps;Seated Short Arc Quad: AROM;Strengthening;Right;10 reps Hip ABduction/ADduction: AROM;Strengthening;Right;20 reps;Seated    General Comments        Pertinent Vitals/Pain Pain Assessment: 0-10 Pain Score: 9  Pain Location: R foot Pain Descriptors / Indicators: Aching;Guarding;Grimacing Pain Intervention(s): Limited activity within patient's tolerance;Monitored during session;Repositioned    Home Living                      Prior Function            PT Goals (current goals can now be found in the care plan section) Acute Rehab PT  Goals Patient Stated Goal: decrease pain PT Goal Formulation: With patient Time For Goal Achievement: 02/05/18 Potential to Achieve Goals: Good Progress towards PT goals: Progressing toward goals    Frequency    Min 3X/week      PT Plan Current plan remains appropriate    Co-evaluation              AM-PAC PT "6 Clicks" Daily Activity  Outcome Measure  Difficulty turning over in bed (including adjusting bedclothes, sheets and blankets)?: None Difficulty moving from lying on back to sitting on the side of the bed? : A Little Difficulty sitting down on and standing up from a chair with arms (e.g., wheelchair, bedside commode, etc,.)?: Unable Help needed moving to and from a bed to chair (including a wheelchair)?: None Help needed walking in hospital room?: A Little Help needed climbing 3-5 steps with a railing? : A Lot 6 Click Score: 17    End of Session Equipment Utilized During Treatment: Gait belt Activity Tolerance: Patient tolerated treatment well;Patient limited by fatigue Patient left: in chair;with call bell/phone within reach Nurse Communication: Mobility status PT Visit Diagnosis: Other abnormalities of gait and mobility (R26.89);Pain Pain - Right/Left: Right Pain - part of body: Ankle and joints of foot     Time: 0932-3557 PT Time Calculation (min) (ACUTE ONLY): 29 min  Charges:    1 gait training: 8-22 min   1 therapeutic exercise: 8-22 min                    Einar Crow, SPT  Student Physical Therapist Acute Rehab [phone]    Einar Crow 01/26/2018, 9:25 AM

## 2018-01-26 NOTE — Telephone Encounter (Signed)
Patient was seen by Dr. Sharol Given in the hospital before he was discharged and was told to follow up on Friday this week instead of Wednesday. On Friday what is a good time for him to come in and see you I guess? # [phone]

## 2018-01-26 NOTE — Telephone Encounter (Signed)
Shane [name]  [phone]  Patient is currently in the ED case worker is requesting a prescription/order for patient unable to discharge without wheelchair.

## 2018-01-26 NOTE — Consult Note (Signed)
Patient denies any concern today. No acute change on physical exam.   A/P:  Chest pain: Atypical. He is currently asymptomatic and saturating well on RA. EKG reviewed. Troponin = trended down well. CTA neg for PA. O2 Sat on RA in the mid to high 90s. Continue ASA 81 mg qd and Statin at d/c home.   HTN: BP improved on current regimen Continue Coreg at the current dose. Hold Lisinopril due to AKI. Will recommend not discharging him home on Lisinopril due to recurrent AKI. Continue Norvasc 10 mg qd and Coreg. PCP F/U recommended for medication adjustment.  DM2: Seems to be doing well on the current regimen. Restart home regimen at discharge as well.  AKI on CKDIII:  Creatinine trended down while off Lisinopril. As discussed with him, we held his Lisinopril and restarted Norvasc. Discuss ACEi with PCP for renal protection at hospital f/u. He agreed with the plan. Do not d/c home on Lisinopril till he sees his PCP.

## 2018-01-27 ENCOUNTER — Telehealth (INDEPENDENT_AMBULATORY_CARE_PROVIDER_SITE_OTHER): Payer: Self-pay | Admitting: Orthopedic Surgery

## 2018-01-27 DIAGNOSIS — I5032 Chronic diastolic (congestive) heart failure: Secondary | ICD-10-CM | POA: Diagnosis not present

## 2018-01-27 DIAGNOSIS — F419 Anxiety disorder, unspecified: Secondary | ICD-10-CM | POA: Diagnosis not present

## 2018-01-27 DIAGNOSIS — I110 Hypertensive heart disease with heart failure: Secondary | ICD-10-CM | POA: Diagnosis not present

## 2018-01-27 DIAGNOSIS — E1140 Type 2 diabetes mellitus with diabetic neuropathy, unspecified: Secondary | ICD-10-CM | POA: Diagnosis not present

## 2018-01-27 DIAGNOSIS — K219 Gastro-esophageal reflux disease without esophagitis: Secondary | ICD-10-CM | POA: Diagnosis not present

## 2018-01-27 DIAGNOSIS — D649 Anemia, unspecified: Secondary | ICD-10-CM | POA: Diagnosis not present

## 2018-01-27 DIAGNOSIS — L02611 Cutaneous abscess of right foot: Secondary | ICD-10-CM | POA: Diagnosis not present

## 2018-01-27 DIAGNOSIS — F319 Bipolar disorder, unspecified: Secondary | ICD-10-CM | POA: Diagnosis not present

## 2018-01-27 DIAGNOSIS — E785 Hyperlipidemia, unspecified: Secondary | ICD-10-CM | POA: Diagnosis not present

## 2018-01-27 NOTE — Telephone Encounter (Signed)
Called and advised ok for HHN to eval incision after vac d/c on Friday. This was not a request for PT. Just make sure that incision is healing and compliance with wound care.

## 2018-01-27 NOTE — Telephone Encounter (Signed)
Claiborne Billings (PT) with [facility] called needing verbal orders for a [facility] nurse to start seeing patient next week. The number to contact Claiborne Billings is [phone]

## 2018-01-28 ENCOUNTER — Encounter (INDEPENDENT_AMBULATORY_CARE_PROVIDER_SITE_OTHER): Payer: Self-pay | Admitting: Orthopedic Surgery

## 2018-01-28 ENCOUNTER — Inpatient Hospital Stay (INDEPENDENT_AMBULATORY_CARE_PROVIDER_SITE_OTHER): Payer: Medicare HMO | Admitting: Family

## 2018-01-28 NOTE — Progress Notes (Signed)
Office Visit Note   Patient: Shane [name]           Date of Birth: 02-25-1971           MRN: [id number] Visit Date: 01/19/2018              Requested by: Charlott Rakes, MD [place], [place] [address] PCP: Charlott Rakes, MD  Chief Complaint  Patient presents with  . Right Foot - Follow-up    ER 01/10/18 ulcer previous I&D       HPI: Patient is seen in follow-up for his right foot patient complains of new vaccinations.  Assessment & Plan: Visit Diagnoses:  1. Midfoot skin ulcer, right, limited to breakdown of skin ([place])     Plan: We will have patient admitted today start an IV antibiotics and plan for surgical debridement of the right foot ulcers.  Follow-Up Instructions: Return in about 2 weeks (around 02/02/2018).   Ortho Exam  Patient is alert, oriented, no adenopathy, well-dressed, normal affect, normal respiratory effort. Examination patient has new large ulcers on the plantar and dorsal aspect of the right foot there is cellulitis there is opening to the wounds there is drainage.  Imaging: No results found. No images are attached to the encounter.  Labs: Lab Results  Component Value Date   HGBA1C 9.4 (A) 11/20/2017   HGBA1C 10.1 07/04/2017   HGBA1C 11.4 05/07/2017   ESRSEDRATE 45 (H) 01/10/2018   ESRSEDRATE 50 (H) 08/08/2017   ESRSEDRATE 22 (H) 06/24/2016   CRP 1.2 (H) 01/10/2018   CRP <0.8 06/24/2016   CRP 0.5 02/20/2016   REPTSTATUS 08/12/2017 FINAL 08/07/2017   GRAMSTAIN  02/21/2016    RARE WBC PRESENT, PREDOMINANTLY MONONUCLEAR RARE GRAM POSITIVE COCCI IN PAIRS    CULT  08/07/2017    NO GROWTH 5 DAYS Performed at [facility], [place] [address] [place], [place] [address]    LABORGA STAPHYLOCOCCUS AUREUS 02/21/2016     Lab Results  Component Value Date   ALBUMIN 3.3 (L) 01/19/2018   ALBUMIN 3.1 (L) 01/11/2018   ALBUMIN 3.1 (L) 01/10/2018   PREALBUMIN 30.7 12/31/2015   PREALBUMIN 34.1 12/02/2015    Body mass  index is 31.79 kg/m.  Orders:  No orders of the defined types were placed in this encounter.  No orders of the defined types were placed in this encounter.    Procedures: No procedures performed  Clinical Data: No additional findings.  ROS:  All other systems negative, except as noted in the HPI. Review of Systems  Objective: Vital Signs: Ht 5\' 7"  (1.702 m)   Wt 203 lb (92.1 kg)   BMI 31.79 kg/m   Specialty Comments:  No specialty comments available.  PMFS History: Patient Active Problem List   Diagnosis Date Noted  . Hypoxia   . Abscess of right foot 01/19/2018  . Depression   . PAD (peripheral artery disease) ([place])   . Midfoot skin ulcer, right, limited to breakdown of skin ([place]) 08/14/2017  . Hypertension, uncontrolled 08/08/2017  . Cellulitis of foot, right 08/07/2017  . Bulging lumbar disc 07/04/2017  . Weakness of right lower extremity 05/19/2017  . Cellulitis 05/18/2017  . Achilles tendon contracture, left 01/14/2017  . Gingivitis 11/14/2016  . Achilles tendon contracture, right 09/17/2016  . Pain and swelling of left lower leg 09/16/2016  . CKD (chronic kidney disease) stage 3, GFR 30-59 ml/min (HCC) 08/27/2016  . Type 2 diabetes mellitus without complication, with long-term current use of  insulin ([place]) 07/22/2016  . Constipation 07/02/2016  . S/P transmetatarsal amputation of foot, left ([place]) 07/02/2016  . Shortness of breath 07/02/2016  . Diabetic gastroparesis ([place]) 05/30/2016  . Buzzing in ear, right 03/26/2016  . Joint pain 03/11/2016  . Weakness 03/08/2016  . Dizziness 03/03/2016  . Chronic diastolic CHF (congestive heart failure), NYHA class 1 ([place]) 12/30/2015  . Diabetes mellitus type 2, uncontrolled, with complications ([place]) 09/32/3557  . Depression with anxiety 12/01/2015  . Pain in the chest 12/01/2015  . Insomnia 11/21/2015  . GERD (gastroesophageal reflux disease) 08/18/2015  . Erectile dysfunction 07/04/2015  . Symptomatic anemia  04/24/2015  . AKI (acute kidney injury) ([place]) 04/24/2015  . Left arm weakness 04/02/2015  . Sensory disturbance 04/02/2015  . Essential hypertension 04/02/2015  . Hemispheric carotid artery syndrome   . HLD (hyperlipidemia)   . Headache    Past Medical History:  Diagnosis Date  . Anemia   . Bipolar disorder ([place])   . Cellulitis 05/2017   RIGHT FOOT   DIABETIC ULCER   . Cellulitis and abscess of left leg 06/2016  . Chest pain    a. 2015 Reportedly normal stress test in [place].  Marland Kitchen Chronic diastolic CHF (congestive heart failure) (HCC)    a.03/2015 Echo: EF 55-60%, Gr 1 DD, mild MR, triv PR.  . Depression   . Depression with anxiety   . Dyspnea    with exertion  . GERD (gastroesophageal reflux disease)   . Glaucoma   . Hyperlipidemia   . Hypertension    a. 08/2014 Admitted with hypertensive urgency.  . Insomnia   . Internal carotid artery stenosis   . Lower GI bleed 07/04/2015  . Neuropathy   . Refusal of blood transfusions as patient is Jehovah's Witness   . TIA (transient ischemic attack) 08/2014; 03/2015   a. 08/2014 in setting of hypertensive urgency.  . Type II diabetes mellitus ([place])    started insulin spring 2016, Type II  . Vitamin D deficiency spring 2016    Family History  Problem Relation Age of Onset  . Hypertension Mother   . Diabetes Mother   . Hyperlipidemia Mother   . Heart disease Mother        s/p pacemaker  . Diabetes Father   . Hypertension Father   . Stroke Father   . Heart attack Father        first MI @ 51.  . Depression Father   . Dementia Father   . Stroke Brother   . ADD / ADHD Brother   . Anxiety disorder Brother   . Bipolar disorder Brother   . OCD Brother   . Sexual abuse Brother     Past Surgical History:  Procedure Laterality Date  . AMPUTATION Left 08/03/2015   Procedure: AMPUTATION LEFT GREAT TOE;  Surgeon: Newt Minion, MD;  Location: [facility];  Service: Orthopedics;  Laterality: Left;  . AMPUTATION Left 12/02/2015   Procedure:  amputation of left 2nd digit  . AMPUTATION Left 04/10/2016   Procedure: Left 2nd Toe Amputation at MTP Joint;  Surgeon: Newt Minion, MD;  Location: [facility];  Service: Orthopedics;  Laterality: Left;  . AMPUTATION Left 06/09/2016   Procedure: AMPUTATION LEFT THIRD TOE;  Surgeon: [name], MD;  Location: [facility];  Service: Orthopedics;  Laterality: Left;  . AMPUTATION Left 06/26/2016   Procedure: Left Foot Transmetatarsal Amputation;  Surgeon: Newt Minion, MD;  Location: [facility];  Service: Orthopedics;  Laterality: Left;  .  APPLICATION OF WOUND VAC Left 12/19/2015   Procedure: APPLICATION OF WOUND VAC;  Surgeon: Meredith Pel, MD;  Location: [facility];  Service: Orthopedics;  Laterality: Left;  . CIRCUMCISION    . COLONOSCOPY N/A 01/22/2016   Procedure: COLONOSCOPY;  Surgeon: Gatha Mayer, MD;  Location: [facility];  Service: Endoscopy;  Laterality: N/A;  . ESOPHAGOGASTRODUODENOSCOPY N/A 01/19/2016   Procedure: ESOPHAGOGASTRODUODENOSCOPY (EGD);  Surgeon: Manus Gunning, MD;  Location: [facility];  Service: Gastroenterology;  Laterality: N/A;  . ESOPHAGOGASTRODUODENOSCOPY N/A 01/22/2016   Procedure: ESOPHAGOGASTRODUODENOSCOPY (EGD);  Surgeon: Gatha Mayer, MD;  Location: [facility] ENDOSCOPY;  Service: Endoscopy;  Laterality: N/A;  . I&D EXTREMITY Left 12/02/2015   Procedure: IRRIGATION AND DEBRIDEMENT OF FOOT; LEFT SECOND TOE AMPUTATION;  Surgeon: Meredith Pel, MD;  Location: [facility];  Service: Orthopedics;  Laterality: Left;  . I&D EXTREMITY Left 12/19/2015   Procedure: I & D LEFT FOOT WITH BEADS ;  Surgeon: Meredith Pel, MD;  Location: [facility];  Service: Orthopedics;  Laterality: Left;  . I&D EXTREMITY Right 01/17/2016   Procedure: IRRIGATION AND DEBRIDEMENT RIGHT FOOT;  Surgeon: Newt Minion, MD;  Location: [facility] [facility];  Service: Orthopedics;  Laterality: Right;  . I&D EXTREMITY Right 01/21/2018   Procedure: IRRIGATION AND DEBRIDEMENT RIGHT FOOT;  Surgeon: Newt Minion, MD;  Location: [facility];   Service: Orthopedics;  Laterality: Right;  . INCISION AND DRAINAGE FOOT Right 01/17/2016  . INGUINAL HERNIA REPAIR Bilateral ~ 1983- ~ 1986  . TONSILLECTOMY  ~ 42   Social History   Occupational History  . Occupation: disabled  Tobacco Use  . Smoking status: Never Smoker  . Smokeless tobacco: Never Used  Substance and Sexual Activity  . Alcohol use: No    Alcohol/week: 0.0 standard drinks  . Drug use: No  . Sexual activity: Not Currently

## 2018-01-30 ENCOUNTER — Encounter (INDEPENDENT_AMBULATORY_CARE_PROVIDER_SITE_OTHER): Payer: Self-pay | Admitting: Physician Assistant

## 2018-01-30 ENCOUNTER — Ambulatory Visit (INDEPENDENT_AMBULATORY_CARE_PROVIDER_SITE_OTHER): Payer: Medicare HMO | Admitting: Physician Assistant

## 2018-01-30 DIAGNOSIS — L97413 Non-pressure chronic ulcer of right heel and midfoot with necrosis of muscle: Secondary | ICD-10-CM

## 2018-01-30 DIAGNOSIS — E11621 Type 2 diabetes mellitus with foot ulcer: Secondary | ICD-10-CM

## 2018-01-30 NOTE — Progress Notes (Signed)
Office Visit Note   Patient: Shane [name]           Date of Birth: 03-31-1971           MRN: [id number] Visit Date: 01/30/2018              Requested by: Charlott Rakes, MD [place], [place] [address] PCP: Charlott Rakes, MD   Assessment & Plan: Visit Diagnoses: No diagnosis found.  Plan: Praveena VAC was removed.  He can begin washing the foot with soap and water and apply dry gauze and Ace wrap to the right foot daily.  Continue doxycycline 100 mg p.o. twice daily.  Elevate the right lower extremity as much as possible and continue nonweightbearing.  He will follow-up next week.  Follow-Up Instructions: No follow-ups on file.   Orders:  No orders of the defined types were placed in this encounter.  No orders of the defined types were placed in this encounter.     Procedures: No procedures performed   Clinical Data: No additional findings.   Subjective: No chief complaint on file.   HPI Shane [name] is a 47 year old male who is seen for postoperative follow-up following irrigation and debridement of his right foot with excision of soft tissue and muscle, local tissue rearrangement for wound closure 2 x 7 cm x 2 wounds and application of the Praveena wound VAC. He is now postoperative day #9.  He reports moderate pain in the foot following surgery which is controlled by his pain medications.  He has been trying to keep the foot elevated and is not ambulating on the foot.  He continues on doxycycline following his discharge from the hospital. Review of Systems   Objective: Vital Signs: There were no vitals taken for this visit.  Physical Exam On physical exam, he presents in a wheelchair.  The Praveena VAC remains on the right foot but he reports it stopped working yesterday.  The VAC dressing was removed and the skin is somewhat macerated and there is some old blood retained within the [facility] dressing.  There is no current drainage once the old blood  was cleaned.  He has good pedal pulses and is neuro intact.  The incisions are intact but under some tension and there is some edema in the foot. He is otherwise alert oriented and appropriate. Ortho Exam  Specialty Comments:  No specialty comments available.  Imaging: No results found.   PMFS History: Patient Active Problem List   Diagnosis Date Noted  . Hypoxia   . Abscess of right foot 01/19/2018  . Depression   . PAD (peripheral artery disease) ([place])   . Midfoot skin ulcer, right, limited to breakdown of skin ([place]) 08/14/2017  . Hypertension, uncontrolled 08/08/2017  . Cellulitis of foot, right 08/07/2017  . Bulging lumbar disc 07/04/2017  . Weakness of right lower extremity 05/19/2017  . Cellulitis 05/18/2017  . Achilles tendon contracture, left 01/14/2017  . Gingivitis 11/14/2016  . Achilles tendon contracture, right 09/17/2016  . Pain and swelling of left lower leg 09/16/2016  . CKD (chronic kidney disease) stage 3, GFR 30-59 ml/min (HCC) 08/27/2016  . Type 2 diabetes mellitus without complication, with long-term current use of insulin ([place]) 07/22/2016  . Constipation 07/02/2016  . S/P transmetatarsal amputation of foot, left ([place]) 07/02/2016  . Shortness of breath 07/02/2016  . Diabetic gastroparesis ([place]) 05/30/2016  . Buzzing in ear, right 03/26/2016  . Joint pain 03/11/2016  . Weakness 03/08/2016  . Dizziness  03/03/2016  . Chronic diastolic CHF (congestive heart failure), NYHA class 1 ([place]) 12/30/2015  . Diabetes mellitus type 2, uncontrolled, with complications ([place]) 78/46/9629  . Depression with anxiety 12/01/2015  . Pain in the chest 12/01/2015  . Insomnia 11/21/2015  . GERD (gastroesophageal reflux disease) 08/18/2015  . Erectile dysfunction 07/04/2015  . Symptomatic anemia 04/24/2015  . AKI (acute kidney injury) ([place]) 04/24/2015  . Left arm weakness 04/02/2015  . Sensory disturbance 04/02/2015  . Essential hypertension 04/02/2015  . Hemispheric  carotid artery syndrome   . HLD (hyperlipidemia)   . Headache    Past Medical History:  Diagnosis Date  . Anemia   . Bipolar disorder ([place])   . Cellulitis 05/2017   RIGHT FOOT   DIABETIC ULCER   . Cellulitis and abscess of left leg 06/2016  . Chest pain    a. 2015 Reportedly normal stress test in [place].  Marland Kitchen Chronic diastolic CHF (congestive heart failure) (HCC)    a.03/2015 Echo: EF 55-60%, Gr 1 DD, mild MR, triv PR.  . Depression   . Depression with anxiety   . Dyspnea    with exertion  . GERD (gastroesophageal reflux disease)   . Glaucoma   . Hyperlipidemia   . Hypertension    a. 08/2014 Admitted with hypertensive urgency.  . Insomnia   . Internal carotid artery stenosis   . Lower GI bleed 07/04/2015  . Neuropathy   . Refusal of blood transfusions as patient is Jehovah's Witness   . TIA (transient ischemic attack) 08/2014; 03/2015   a. 08/2014 in setting of hypertensive urgency.  . Type II diabetes mellitus ([place])    started insulin spring 2016, Type II  . Vitamin D deficiency spring 2016    Family History  Problem Relation Age of Onset  . Hypertension Mother   . Diabetes Mother   . Hyperlipidemia Mother   . Heart disease Mother        s/p pacemaker  . Diabetes Father   . Hypertension Father   . Stroke Father   . Heart attack Father        first MI @ 71.  . Depression Father   . Dementia Father   . Stroke Brother   . ADD / ADHD Brother   . Anxiety disorder Brother   . Bipolar disorder Brother   . OCD Brother   . Sexual abuse Brother     Past Surgical History:  Procedure Laterality Date  . AMPUTATION Left 08/03/2015   Procedure: AMPUTATION LEFT GREAT TOE;  Surgeon: Newt Minion, MD;  Location: [facility];  Service: Orthopedics;  Laterality: Left;  . AMPUTATION Left 12/02/2015   Procedure: amputation of left 2nd digit  . AMPUTATION Left 04/10/2016   Procedure: Left 2nd Toe Amputation at MTP Joint;  Surgeon: Newt Minion, MD;  Location: [facility];  Service: Orthopedics;   Laterality: Left;  . AMPUTATION Left 06/09/2016   Procedure: AMPUTATION LEFT THIRD TOE;  Surgeon: [name], MD;  Location: [facility];  Service: Orthopedics;  Laterality: Left;  . AMPUTATION Left 06/26/2016   Procedure: Left Foot Transmetatarsal Amputation;  Surgeon: Newt Minion, MD;  Location: [facility];  Service: Orthopedics;  Laterality: Left;  . APPLICATION OF WOUND VAC Left 12/19/2015   Procedure: APPLICATION OF WOUND VAC;  Surgeon: Meredith Pel, MD;  Location: [facility];  Service: Orthopedics;  Laterality: Left;  . CIRCUMCISION    . COLONOSCOPY N/A 01/22/2016   Procedure: COLONOSCOPY;  Surgeon: Ofilia Neas  Carlean Purl, MD;  Location: [facility];  Service: Endoscopy;  Laterality: N/A;  . ESOPHAGOGASTRODUODENOSCOPY N/A 01/19/2016   Procedure: ESOPHAGOGASTRODUODENOSCOPY (EGD);  Surgeon: Manus Gunning, MD;  Location: [facility];  Service: Gastroenterology;  Laterality: N/A;  . ESOPHAGOGASTRODUODENOSCOPY N/A 01/22/2016   Procedure: ESOPHAGOGASTRODUODENOSCOPY (EGD);  Surgeon: Gatha Mayer, MD;  [facility] ENDOSCOPY;  Service: Endoscopy;  Laterality: N/A;  . I&D EXTREMITY Left 12/02/2015   Procedure: IRRIGATION AND DEBRIDEMENT OF FOOT; LEFT SECOND TOE AMPUTATION;  Surgeon: Meredith Pel, MD;  Location: [facility];  Service: Orthopedics;  Laterality: Left;  . I&D EXTREMITY Left 12/19/2015   Procedure: I & D LEFT FOOT WITH BEADS ;  Surgeon: Meredith Pel, MD;  Location: [facility];  Service: Orthopedics;  Laterality: Left;  . I&D EXTREMITY Right 01/17/2016   Procedure: IRRIGATION AND DEBRIDEMENT RIGHT FOOT;  Surgeon: Newt Minion, MD;  Location: [facility];  Service: Orthopedics;  Laterality: Right;  . I&D EXTREMITY Right 01/21/2018   Procedure: IRRIGATION AND DEBRIDEMENT RIGHT FOOT;  Surgeon: Newt Minion, MD;  Location: [facility];  Service: Orthopedics;  Laterality: Right;  . INCISION AND DRAINAGE FOOT Right 01/17/2016  . INGUINAL HERNIA REPAIR Bilateral ~ 1983- ~ 1986  . TONSILLECTOMY  ~ 17   Social  History   Occupational History  . Occupation: disabled  Tobacco Use  . Smoking status: Never Smoker  . Smokeless tobacco: Never Used  Substance and Sexual Activity  . Alcohol use: No    Alcohol/week: 0.0 standard drinks  . Drug use: No  . Sexual activity: Not Currently

## 2018-02-01 DIAGNOSIS — F319 Bipolar disorder, unspecified: Secondary | ICD-10-CM | POA: Diagnosis not present

## 2018-02-01 DIAGNOSIS — I110 Hypertensive heart disease with heart failure: Secondary | ICD-10-CM | POA: Diagnosis not present

## 2018-02-01 DIAGNOSIS — F419 Anxiety disorder, unspecified: Secondary | ICD-10-CM | POA: Diagnosis not present

## 2018-02-01 DIAGNOSIS — K219 Gastro-esophageal reflux disease without esophagitis: Secondary | ICD-10-CM | POA: Diagnosis not present

## 2018-02-01 DIAGNOSIS — L02611 Cutaneous abscess of right foot: Secondary | ICD-10-CM | POA: Diagnosis not present

## 2018-02-01 DIAGNOSIS — D649 Anemia, unspecified: Secondary | ICD-10-CM | POA: Diagnosis not present

## 2018-02-01 DIAGNOSIS — E1140 Type 2 diabetes mellitus with diabetic neuropathy, unspecified: Secondary | ICD-10-CM | POA: Diagnosis not present

## 2018-02-01 DIAGNOSIS — E785 Hyperlipidemia, unspecified: Secondary | ICD-10-CM | POA: Diagnosis not present

## 2018-02-01 DIAGNOSIS — I5032 Chronic diastolic (congestive) heart failure: Secondary | ICD-10-CM | POA: Diagnosis not present

## 2018-02-02 ENCOUNTER — Telehealth (INDEPENDENT_AMBULATORY_CARE_PROVIDER_SITE_OTHER): Payer: Self-pay | Admitting: Orthopedic Surgery

## 2018-02-02 ENCOUNTER — Telehealth (INDEPENDENT_AMBULATORY_CARE_PROVIDER_SITE_OTHER): Payer: Self-pay | Admitting: Family Medicine

## 2018-02-02 ENCOUNTER — Telehealth: Payer: Self-pay | Admitting: Family Medicine

## 2018-02-02 MED ORDER — AMLODIPINE BESYLATE 5 MG PO TABS: 5.0000 mg | ORAL_TABLET | Freq: Every day | ORAL | 3 refills | Status: AC

## 2018-02-02 NOTE — Telephone Encounter (Signed)
Wells Guiles from [facility] called to inform that mr.Mckendree BP was 180/110 yesterday afternoon. She tired to get in contact with the triage nurse but nobody answer and were not able to leave a voicemail. Wells Guiles states that they will be going back out to check up on him.  Please advice [phone]  Thank you Emmit Pomfret

## 2018-02-02 NOTE — Telephone Encounter (Signed)
Rip Harbour, nurse at [facility] is requesting nursing orders  1 day 1 1 week 3  2 PRN   CB # [phone] ext [phone]

## 2018-02-02 NOTE — Telephone Encounter (Signed)
Phone call returned to [place] from [facility].  I left voice message informing her her that Dr. Margarita Rana is not in the office today but I have received her message.  I recommend that we add amlodipine 5 mg daily if blood pressure is still elevated when they check it today.  She can call us back tomorrow and let me know I will send the prescription to the pharmacy.

## 2018-02-02 NOTE — Addendum Note (Signed)
Addended by: Karle Plumber B on: 02/02/2018 05:05 PM   Modules accepted: Orders

## 2018-02-02 NOTE — Telephone Encounter (Signed)
Nurse from [facility] called stating pt has high BP, phone line cut off

## 2018-02-02 NOTE — Telephone Encounter (Signed)
Called to advise verbal ok for [facility] orders as below.

## 2018-02-02 NOTE — Telephone Encounter (Signed)
Will route to PCP for review. 

## 2018-02-03 ENCOUNTER — Ambulatory Visit (INDEPENDENT_AMBULATORY_CARE_PROVIDER_SITE_OTHER): Payer: Medicare HMO | Admitting: Orthopedic Surgery

## 2018-02-03 ENCOUNTER — Encounter (INDEPENDENT_AMBULATORY_CARE_PROVIDER_SITE_OTHER): Payer: Self-pay | Admitting: Orthopedic Surgery

## 2018-02-03 VITALS — Ht 67.0 in | Wt 202.0 lb

## 2018-02-03 DIAGNOSIS — E11621 Type 2 diabetes mellitus with foot ulcer: Secondary | ICD-10-CM

## 2018-02-03 DIAGNOSIS — L97413 Non-pressure chronic ulcer of right heel and midfoot with necrosis of muscle: Secondary | ICD-10-CM

## 2018-02-03 MED FILL — AMLODIPINE BESYLATE 5 MG TA: 5 | 30 days supply | Qty: 30 | Fill #0

## 2018-02-03 NOTE — Progress Notes (Signed)
Office Visit Note   Patient: Shane [name]           Date of Birth: [birth year]           MRN: [id number] Visit Date: 02/03/2018              Requested by: Shane Rakes, MD [place], [place] [address] PCP: Shane Rakes, MD  Chief Complaint  Patient presents with  . Right Foot - Routine Post Op    01/21/18 right foot I&D      HPI: Patient is a 47 year old gentleman who presents in follow-up status post irrigation and debridement of 2 large wounds of the right foot.  Patient is completing a course of doxycycline he is elevating his foot he is minimizing his weightbearing.  Assessment & Plan: Visit Diagnoses:  1. Diabetic ulcer of right midfoot associated with type 2 diabetes mellitus, with necrosis of muscle ([place])     Plan: Patient will wash his foot with soap and water continue with 4 x 4 gauze and an Ace wrap continue nonweightbearing.  Harvest the sutures in 1 week.  He will wear the medical compression sock with getting on.  Follow-Up Instructions: Return in about 1 week (around 02/10/2018).   Ortho Exam  Patient is alert, oriented, no adenopathy, well-dressed, normal affect, normal respiratory effort. Examination patient has good wrinkling of the skin the wound edges are well approximated there is a few areas where the wound is gaped open but there is clear drainage no purulence no cellulitis no signs of infection.  Imaging: No results found. No images are attached to the encounter.  Labs: Lab Results  Component Value Date   HGBA1C 9.4 (A) 11/20/2017   HGBA1C 10.1 07/04/2017   HGBA1C 11.4 05/07/2017   ESRSEDRATE 45 (H) 01/10/2018   ESRSEDRATE 50 (H) 08/08/2017   ESRSEDRATE 22 (H) 06/24/2016   CRP 1.2 (H) 01/10/2018   CRP <0.8 06/24/2016   CRP 0.5 02/20/2016   REPTSTATUS 08/12/2017 FINAL 08/07/2017   GRAMSTAIN  02/21/2016    RARE WBC PRESENT, PREDOMINANTLY MONONUCLEAR RARE GRAM POSITIVE COCCI IN PAIRS    CULT  08/07/2017    NO GROWTH 5  DAYS Performed at [facility], [place] [address], [place], [place] [address]    LABORGA STAPHYLOCOCCUS AUREUS 02/21/2016     Lab Results  Component Value Date   ALBUMIN 3.3 (L) 01/19/2018   ALBUMIN 3.1 (L) 01/11/2018   ALBUMIN 3.1 (L) 01/10/2018   PREALBUMIN 30.7 12/31/2015   PREALBUMIN 34.1 12/02/2015    Body mass index is 31.64 kg/m.  Orders:  No orders of the defined types were placed in this encounter.  No orders of the defined types were placed in this encounter.    Procedures: No procedures performed  Clinical Data: No additional findings.  ROS:  All other systems negative, except as noted in the HPI. Review of Systems  Objective: Vital Signs: Ht 5\' 7"  (1.702 m)   Wt 202 lb (91.6 kg)   BMI 31.64 kg/m   Specialty Comments:  No specialty comments available.  PMFS History: Patient Active Problem List   Diagnosis Date Noted  . Hypoxia   . Abscess of right foot 01/19/2018  . Depression   . PAD (peripheral artery disease) ([place])   . Midfoot skin ulcer, right, limited to breakdown of skin ([place]) 08/14/2017  . Hypertension, uncontrolled 08/08/2017  . Cellulitis of foot, right 08/07/2017  . Bulging lumbar disc 07/04/2017  . Weakness of right  lower extremity 05/19/2017  . Cellulitis 05/18/2017  . Achilles tendon contracture, left 01/14/2017  . Gingivitis 11/14/2016  . Achilles tendon contracture, right 09/17/2016  . Pain and swelling of left lower leg 09/16/2016  . CKD (chronic kidney disease) stage 3, GFR 30-59 ml/min (HCC) 08/27/2016  . Type 2 diabetes mellitus without complication, with long-term current use of insulin ([place]) 07/22/2016  . Constipation 07/02/2016  . S/P transmetatarsal amputation of foot, left ([place]) 07/02/2016  . Shortness of breath 07/02/2016  . Diabetic gastroparesis ([place]) 05/30/2016  . Buzzing in ear, right 03/26/2016  . Joint pain 03/11/2016  . Weakness 03/08/2016  . Dizziness 03/03/2016  . Chronic diastolic CHF (congestive  heart failure), NYHA class 1 ([place]) 12/30/2015  . Diabetes mellitus type 2, uncontrolled, with complications ([place]) 67/59/1638  . Depression with anxiety 12/01/2015  . Pain in the chest 12/01/2015  . Insomnia 11/21/2015  . GERD (gastroesophageal reflux disease) 08/18/2015  . Erectile dysfunction 07/04/2015  . Symptomatic anemia 04/24/2015  . AKI (acute kidney injury) ([place]) 04/24/2015  . Left arm weakness 04/02/2015  . Sensory disturbance 04/02/2015  . Essential hypertension 04/02/2015  . Hemispheric carotid artery syndrome   . HLD (hyperlipidemia)   . Headache    Past Medical History:  Diagnosis Date  . Anemia   . Bipolar disorder ([place])   . Cellulitis 05/2017   RIGHT FOOT   DIABETIC ULCER   . Cellulitis and abscess of left leg 06/2016  . Chest pain    a. 2015 Reportedly normal stress test in [place].  Marland Kitchen Chronic diastolic CHF (congestive heart failure) (HCC)    a.03/2015 Echo: EF 55-60%, Gr 1 DD, mild MR, triv PR.  . Depression   . Depression with anxiety   . Dyspnea    with exertion  . GERD (gastroesophageal reflux disease)   . Glaucoma   . Hyperlipidemia   . Hypertension    a. 08/2014 Admitted with hypertensive urgency.  . Insomnia   . Internal carotid artery stenosis   . Lower GI bleed 07/04/2015  . Neuropathy   . Refusal of blood transfusions as patient is Jehovah's Witness   . TIA (transient ischemic attack) 08/2014; 03/2015   a. 08/2014 in setting of hypertensive urgency.  . Type II diabetes mellitus ([place])    started insulin spring 2016, Type II  . Vitamin D deficiency spring 2016    Family History  Problem Relation Age of Onset  . Hypertension Mother   . Diabetes Mother   . Hyperlipidemia Mother   . Heart disease Mother        s/p pacemaker  . Diabetes Father   . Hypertension Father   . Stroke Father   . Heart attack Father        first MI @ 54.  . Depression Father   . Dementia Father   . Stroke Brother   . ADD / ADHD Brother   . Anxiety disorder Brother     . Bipolar disorder Brother   . OCD Brother   . Sexual abuse Brother     Past Surgical History:  Procedure Laterality Date  . AMPUTATION Left 08/03/2015   Procedure: AMPUTATION LEFT GREAT TOE;  Surgeon: Newt Minion, MD;  Location: [place];  Service: Orthopedics;  Laterality: Left;  . AMPUTATION Left 12/02/2015   Procedure: amputation of left 2nd digit  . AMPUTATION Left 04/10/2016   Procedure: Left 2nd Toe Amputation at MTP Joint;  Surgeon: Newt Minion, MD;  Location: [facility];  Service: Orthopedics;  Laterality: Left;  . AMPUTATION Left 06/09/2016   Procedure: AMPUTATION LEFT THIRD TOE;  Surgeon: [name], MD;  Location: [facility];  Service: Orthopedics;  Laterality: Left;  . AMPUTATION Left 06/26/2016   Procedure: Left Foot Transmetatarsal Amputation;  Surgeon: Newt Minion, MD;  Location: [facility];  Service: Orthopedics;  Laterality: Left;  . APPLICATION OF WOUND VAC Left 12/19/2015   Procedure: APPLICATION OF WOUND VAC;  Surgeon: Meredith Pel, MD;  Location: [facility];  Service: Orthopedics;  Laterality: Left;  . CIRCUMCISION    . COLONOSCOPY N/A 01/22/2016   Procedure: COLONOSCOPY;  Surgeon: Gatha Mayer, MD;  Location: [facility];  Service: Endoscopy;  Laterality: N/A;  . ESOPHAGOGASTRODUODENOSCOPY N/A 01/19/2016   Procedure: ESOPHAGOGASTRODUODENOSCOPY (EGD);  Surgeon: Manus Gunning, MD;  Location: [facility];  Service: Gastroenterology;  Laterality: N/A;  . ESOPHAGOGASTRODUODENOSCOPY N/A 01/22/2016   Procedure: ESOPHAGOGASTRODUODENOSCOPY (EGD);  Surgeon: Gatha Mayer, MD;  [facility];  Service: Endoscopy;  Laterality: N/A;  . I&D EXTREMITY Left 12/02/2015   Procedure: IRRIGATION AND DEBRIDEMENT OF FOOT; LEFT SECOND TOE AMPUTATION;  Surgeon: Meredith Pel, MD;  Location: [facility];  Service: Orthopedics;  Laterality: Left;  . I&D EXTREMITY Left 12/19/2015   Procedure: I & D LEFT FOOT WITH BEADS ;  Surgeon: Meredith Pel, MD;  Location: [facility];  Service:  Orthopedics;  Laterality: Left;  . I&D EXTREMITY Right 01/17/2016   Procedure: IRRIGATION AND DEBRIDEMENT RIGHT FOOT;  Surgeon: Newt Minion, MD;  Location: [facility];  Service: Orthopedics;  Laterality: Right;  . I&D EXTREMITY Right 01/21/2018   Procedure: IRRIGATION AND DEBRIDEMENT RIGHT FOOT;  Surgeon: Newt Minion, MD;  Location: [facility];  Service: Orthopedics;  Laterality: Right;  . INCISION AND DRAINAGE FOOT Right 01/17/2016  . INGUINAL HERNIA REPAIR Bilateral ~ 1983- ~ 1986  . TONSILLECTOMY  ~ 84   Social History   Occupational History  . Occupation: disabled  Tobacco Use  . Smoking status: Never Smoker  . Smokeless tobacco: Never Used  Substance and Sexual Activity  . Alcohol use: No    Alcohol/week: 0.0 standard drinks  . Drug use: No  . Sexual activity: Not Currently

## 2018-02-03 NOTE — Telephone Encounter (Signed)
Patient was called and informed of new medication being striated and sent to pharmacy.

## 2018-02-04 ENCOUNTER — Telehealth (INDEPENDENT_AMBULATORY_CARE_PROVIDER_SITE_OTHER): Payer: Self-pay

## 2018-02-04 ENCOUNTER — Ambulatory Visit: Payer: Self-pay | Admitting: Gastroenterology

## 2018-02-04 DIAGNOSIS — K219 Gastro-esophageal reflux disease without esophagitis: Secondary | ICD-10-CM | POA: Diagnosis not present

## 2018-02-04 DIAGNOSIS — F419 Anxiety disorder, unspecified: Secondary | ICD-10-CM | POA: Diagnosis not present

## 2018-02-04 DIAGNOSIS — D649 Anemia, unspecified: Secondary | ICD-10-CM | POA: Diagnosis not present

## 2018-02-04 DIAGNOSIS — F319 Bipolar disorder, unspecified: Secondary | ICD-10-CM | POA: Diagnosis not present

## 2018-02-04 DIAGNOSIS — I110 Hypertensive heart disease with heart failure: Secondary | ICD-10-CM | POA: Diagnosis not present

## 2018-02-04 DIAGNOSIS — E1140 Type 2 diabetes mellitus with diabetic neuropathy, unspecified: Secondary | ICD-10-CM | POA: Diagnosis not present

## 2018-02-04 DIAGNOSIS — I5032 Chronic diastolic (congestive) heart failure: Secondary | ICD-10-CM | POA: Diagnosis not present

## 2018-02-04 DIAGNOSIS — E785 Hyperlipidemia, unspecified: Secondary | ICD-10-CM | POA: Diagnosis not present

## 2018-02-04 DIAGNOSIS — L02611 Cutaneous abscess of right foot: Secondary | ICD-10-CM | POA: Diagnosis not present

## 2018-02-04 NOTE — Telephone Encounter (Signed)
[facility] called to report that patient blood pressure reading today is 192/120. Patient was started on Amlodipine by Dr. Wynetta Emery patient has only had one dose of the medication. Sharee Pimple states that she will be back with the patient on Friday and will report patient blood pressure.

## 2018-02-04 NOTE — Telephone Encounter (Signed)
Message left on triage phone: Calling to report that she is seeing pt now and he has new wound on bottom of foot that is bleeding Also, needs wound orders on this pt

## 2018-02-05 NOTE — Telephone Encounter (Signed)
Please ensure he is taking Coreg 25mg  bid and Lisinopril 10mg  as well as Amlodipine 5mg  and if BP is >150/90 at [place] care visit after a repeat then increase Amlodipine to 10mg . Thanks

## 2018-02-05 NOTE — Telephone Encounter (Signed)
Noted  

## 2018-02-05 NOTE — Telephone Encounter (Signed)
I called and sw HHN and she advised small area on the right foot beneath the incision is open. ( like he scrapped it) advised to continue with daily cleansing, dry dressing, elevation and strict non weight bearing. She is going out to see the pt tomorrow and will call if the appt to see the pt needs to be moved up. Otherwise will continue with wound care and see the pt on Thursday next week.

## 2018-02-06 ENCOUNTER — Ambulatory Visit (INDEPENDENT_AMBULATORY_CARE_PROVIDER_SITE_OTHER): Payer: Medicare HMO

## 2018-02-06 VITALS — Ht 67.0 in | Wt 200.0 lb

## 2018-02-06 DIAGNOSIS — E785 Hyperlipidemia, unspecified: Secondary | ICD-10-CM | POA: Diagnosis not present

## 2018-02-06 DIAGNOSIS — E1140 Type 2 diabetes mellitus with diabetic neuropathy, unspecified: Secondary | ICD-10-CM | POA: Diagnosis not present

## 2018-02-06 DIAGNOSIS — D649 Anemia, unspecified: Secondary | ICD-10-CM | POA: Diagnosis not present

## 2018-02-06 DIAGNOSIS — F319 Bipolar disorder, unspecified: Secondary | ICD-10-CM | POA: Diagnosis not present

## 2018-02-06 DIAGNOSIS — L02611 Cutaneous abscess of right foot: Secondary | ICD-10-CM | POA: Diagnosis not present

## 2018-02-06 DIAGNOSIS — L97413 Non-pressure chronic ulcer of right heel and midfoot with necrosis of muscle: Principal | ICD-10-CM

## 2018-02-06 DIAGNOSIS — E11621 Type 2 diabetes mellitus with foot ulcer: Secondary | ICD-10-CM

## 2018-02-06 DIAGNOSIS — K219 Gastro-esophageal reflux disease without esophagitis: Secondary | ICD-10-CM | POA: Diagnosis not present

## 2018-02-06 DIAGNOSIS — I110 Hypertensive heart disease with heart failure: Secondary | ICD-10-CM | POA: Diagnosis not present

## 2018-02-06 DIAGNOSIS — F419 Anxiety disorder, unspecified: Secondary | ICD-10-CM | POA: Diagnosis not present

## 2018-02-06 DIAGNOSIS — I5032 Chronic diastolic (congestive) heart failure: Secondary | ICD-10-CM | POA: Diagnosis not present

## 2018-02-06 NOTE — Progress Notes (Signed)
Patient came in today with concerns about the bottom of his foot. He is s/p a right foot irrigation and debridement 01/21/18. He has two small areas on the bottom of his foot that the pt states were present prior to his surgery. There is no redness, minimal swelling, no drainage. The pt does complaint of chills on an off but his temp in the office today is 98.3 and he is on Doxycyline 100 mg BID. He has a home health nurse that comes to the home several times a week. I called her to advise that they should continue with dry dressing changes, strict non weight bearing and elevation higher than his heart. Patient is to follow up in the office Thursday or sooner if her thinks that things have changed. Advised to call with an update on Monday. Dry dressing reapplied, encouragement provided and pt will contact us with any other questions or concerns.   Autumn Forrest, [place], [employer]

## 2018-02-08 ENCOUNTER — Emergency Department (HOSPITAL_COMMUNITY): Payer: Medicare HMO

## 2018-02-08 ENCOUNTER — Encounter (HOSPITAL_COMMUNITY): Payer: Self-pay

## 2018-02-08 ENCOUNTER — Emergency Department (HOSPITAL_COMMUNITY)
Admission: EM | Admit: 2018-02-08 | Discharge: 2018-02-08 | Disposition: A | Discharge status: Home / Self Care | Payer: Medicare HMO | Attending: Emergency Medicine | Admitting: Emergency Medicine

## 2018-02-08 DIAGNOSIS — Z79899 Other long term (current) drug therapy: Secondary | ICD-10-CM | POA: Insufficient documentation

## 2018-02-08 DIAGNOSIS — R079 Chest pain, unspecified: Secondary | ICD-10-CM | POA: Diagnosis not present

## 2018-02-08 DIAGNOSIS — I130 Hypertensive heart and chronic kidney disease with heart failure and stage 1 through stage 4 chronic kidney disease, or unspecified chronic kidney disease: Secondary | ICD-10-CM | POA: Insufficient documentation

## 2018-02-08 DIAGNOSIS — M79604 Pain in right leg: Secondary | ICD-10-CM | POA: Diagnosis not present

## 2018-02-08 DIAGNOSIS — Z7982 Long term (current) use of aspirin: Secondary | ICD-10-CM | POA: Insufficient documentation

## 2018-02-08 DIAGNOSIS — R0789 Other chest pain: Secondary | ICD-10-CM | POA: Insufficient documentation

## 2018-02-08 DIAGNOSIS — Z794 Long term (current) use of insulin: Secondary | ICD-10-CM | POA: Insufficient documentation

## 2018-02-08 DIAGNOSIS — E1122 Type 2 diabetes mellitus with diabetic chronic kidney disease: Secondary | ICD-10-CM | POA: Diagnosis not present

## 2018-02-08 DIAGNOSIS — M79661 Pain in right lower leg: Secondary | ICD-10-CM | POA: Diagnosis not present

## 2018-02-08 DIAGNOSIS — M79671 Pain in right foot: Secondary | ICD-10-CM | POA: Diagnosis not present

## 2018-02-08 DIAGNOSIS — N183 Chronic kidney disease, stage 3 (moderate): Secondary | ICD-10-CM | POA: Insufficient documentation

## 2018-02-08 DIAGNOSIS — E1140 Type 2 diabetes mellitus with diabetic neuropathy, unspecified: Secondary | ICD-10-CM | POA: Diagnosis not present

## 2018-02-08 DIAGNOSIS — I5032 Chronic diastolic (congestive) heart failure: Secondary | ICD-10-CM | POA: Insufficient documentation

## 2018-02-08 LAB — COMPREHENSIVE METABOLIC PANEL
ALBUMIN: 3.2 g/dL — AB (ref 3.5–5.0)
ALT: 17 U/L (ref 0–44)
ANION GAP: 7 (ref 5–15)
AST: 14 U/L — ABNORMAL LOW (ref 15–41)
Alkaline Phosphatase: 62 U/L (ref 38–126)
BILIRUBIN TOTAL: 0.3 mg/dL (ref 0.3–1.2)
BUN: 15 mg/dL (ref 6–20)
CO2: 31 mmol/L (ref 22–32)
Calcium: 9.3 mg/dL (ref 8.9–10.3)
Chloride: 102 mmol/L (ref 98–111)
Creatinine, Ser: 1.44 mg/dL — ABNORMAL HIGH (ref 0.61–1.24)
GFR calc Af Amer: >60 mL/min (ref >60)
GFR, EST NON AFRICAN AMERICAN: 56 mL/min — AB (ref >60)
GLUCOSE: 168 mg/dL — AB (ref 70–99)
POTASSIUM: 4.3 mmol/L (ref 3.5–5.1)
Sodium: 140 mmol/L (ref 135–145)
TOTAL PROTEIN: 6.8 g/dL (ref 6.5–8.1)

## 2018-02-08 LAB — CBC WITH DIFFERENTIAL/PLATELET
ABS IMMATURE GRANULOCYTES: 0 10*3/uL (ref 0.0–0.1)
BASOS ABS: 0.1 10*3/uL (ref 0.0–0.1)
BASOS PCT: 1 %
EOS PCT: 3 %
Eosinophils Absolute: 0.3 10*3/uL (ref 0.0–0.7)
HCT: 37.5 % — ABNORMAL LOW (ref 39.0–52.0)
Hemoglobin: 11.7 g/dL — ABNORMAL LOW (ref 13.0–17.0)
Immature Granulocytes: 0 %
Lymphocytes Relative: 21 %
Lymphs Abs: 1.7 10*3/uL (ref 0.7–4.0)
MCH: 29.9 pg (ref 26.0–34.0)
MCHC: 31.2 g/dL (ref 30.0–36.0)
MCV: 95.9 fL (ref 78.0–100.0)
MONO ABS: 0.4 10*3/uL (ref 0.1–1.0)
Monocytes Relative: 5 %
NEUTROS ABS: 5.7 10*3/uL (ref 1.7–7.7)
Neutrophils Relative %: 70 %
PLATELETS: 437 10*3/uL — AB (ref 150–400)
RBC: 3.91 MIL/uL — AB (ref 4.22–5.81)
RDW: 13.1 % (ref 11.5–15.5)
WBC: 8.2 10*3/uL (ref 4.0–10.5)

## 2018-02-08 LAB — I-STAT TROPONIN, ED
TROPONIN I, POC: 0.01 ng/mL (ref 0.00–0.08)
Troponin i, poc: 0.03 ng/mL (ref 0.00–0.08)

## 2018-02-08 LAB — I-STAT CG4 LACTIC ACID, ED
Lactic Acid, Venous: 0.73 mmol/L (ref 0.5–1.9)
Lactic Acid, Venous: 0.55 mmol/L (ref 0.5–1.9)

## 2018-02-08 MED ORDER — ACETAMINOPHEN 325 MG PO TABS
650.0000 mg | ORAL_TABLET | Freq: Once | ORAL | Status: AC
  Administered 2018-02-08: 650 mg via ORAL
  Filled 2018-02-08: qty 2

## 2018-02-08 NOTE — ED Notes (Signed)
Patient transported to X-ray 

## 2018-02-08 NOTE — Discharge Instructions (Addendum)
Call Dr. Sharol Given and follow-up with him tomorrow

## 2018-02-08 NOTE — ED Notes (Signed)
Phlebotomy at bedside.

## 2018-02-08 NOTE — ED Notes (Signed)
Per Pharmacy,patient states that he has not been taking  Coreg twice daily. He has also not been taking doxycycline as prescribed as he still has medication left in possession.

## 2018-02-08 NOTE — ED Notes (Signed)
Patient denies chest pain or discomfort.

## 2018-02-08 NOTE — ED Triage Notes (Signed)
Pt presents for evaluation of chest pain with sob and generalized fatigue since yesterday. PT reports has poor healing surgical wounds to R foot. Hx of diabetes. Had surgery 2 weeks ago and has developed new wounds since. Concerned for infection.

## 2018-02-08 NOTE — ED Provider Notes (Signed)
[facility] Provider Note   CSN: [id number] Arrival date & time: 02/08/18  1305     History   Chief Complaint Chief Complaint  Patient presents with  . Chest Pain  . Wound Infection    HPI Shane [name] is a 47 y.o. male.  47 year old male presents with concern for new onset paresthesias to the dorsum of his right foot.  Saw his physician for wound check after patient had surgery for an infection that was irrigation and debridement.  His provider was not available but he did get evaluated by office nurses had looked okay.  Patient states that he has worsening numbness to the bottom of his foot.  He has had no drainage from the old surgical wound itself but notices that he is got 2 new areas with his skin is cracking as he is concerned that he might have new infection.  He had chills but no reported fever.  He has a secondary complaint of having chest pain which began today which lasted about 5 minutes and associated dyspnea which occurs at rest.  States that when he has pain in his foot that seems to make his pain in his chest worse.  Symptoms resolved spontaneously.     Past Medical History:  Diagnosis Date  . Anemia   . Bipolar disorder ([place])   . Cellulitis 05/2017   RIGHT FOOT   DIABETIC ULCER   . Cellulitis and abscess of left leg 06/2016  . Chest pain    a. 2015 Reportedly normal stress test in [place].  Marland Kitchen Chronic diastolic CHF (congestive heart failure) (HCC)    a.03/2015 Echo: EF 55-60%, Gr 1 DD, mild MR, triv PR.  . Depression   . Depression with anxiety   . Dyspnea    with exertion  . GERD (gastroesophageal reflux disease)   . Glaucoma   . Hyperlipidemia   . Hypertension    a. 08/2014 Admitted with hypertensive urgency.  . Insomnia   . Internal carotid artery stenosis   . Lower GI bleed 07/04/2015  . Neuropathy   . Refusal of blood transfusions as patient is Jehovah's Witness   . TIA (transient ischemic attack) 08/2014; 03/2015     a. 08/2014 in setting of hypertensive urgency.  . Type II diabetes mellitus ([place])    started insulin spring 2016, Type II  . Vitamin D deficiency spring 2016    Patient Active Problem List   Diagnosis Date Noted  . Hypoxia   . Abscess of right foot 01/19/2018  . Depression   . PAD (peripheral artery disease) ([place])   . Midfoot skin ulcer, right, limited to breakdown of skin ([place]) 08/14/2017  . Hypertension, uncontrolled 08/08/2017  . Cellulitis of foot, right 08/07/2017  . Bulging lumbar disc 07/04/2017  . Weakness of right lower extremity 05/19/2017  . Cellulitis 05/18/2017  . Achilles tendon contracture, left 01/14/2017  . Gingivitis 11/14/2016  . Achilles tendon contracture, right 09/17/2016  . Pain and swelling of left lower leg 09/16/2016  . CKD (chronic kidney disease) stage 3, GFR 30-59 ml/min (HCC) 08/27/2016  . Type 2 diabetes mellitus without complication, with long-term current use of insulin ([place]) 07/22/2016  . Constipation 07/02/2016  . S/P transmetatarsal amputation of foot, left ([place]) 07/02/2016  . Shortness of breath 07/02/2016  . Diabetic gastroparesis ([place]) 05/30/2016  . Buzzing in ear, right 03/26/2016  . Joint pain 03/11/2016  . Weakness 03/08/2016  . Dizziness 03/03/2016  . Chronic diastolic CHF (congestive  heart failure), NYHA class 1 ([place]) 12/30/2015  . Diabetes mellitus type 2, uncontrolled, with complications ([place]) 40/98/1191  . Depression with anxiety 12/01/2015  . Pain in the chest 12/01/2015  . Insomnia 11/21/2015  . GERD (gastroesophageal reflux disease) 08/18/2015  . Erectile dysfunction 07/04/2015  . Symptomatic anemia 04/24/2015  . AKI (acute kidney injury) ([place]) 04/24/2015  . Left arm weakness 04/02/2015  . Sensory disturbance 04/02/2015  . Essential hypertension 04/02/2015  . Hemispheric carotid artery syndrome   . HLD (hyperlipidemia)   . Headache     Past Surgical History:  Procedure Laterality Date  . AMPUTATION Left 08/03/2015    Procedure: AMPUTATION LEFT GREAT TOE;  Surgeon: Newt Minion, MD;  Location: [facility];  Service: Orthopedics;  Laterality: Left;  . AMPUTATION Left 12/02/2015   Procedure: amputation of left 2nd digit  . AMPUTATION Left 04/10/2016   Procedure: Left 2nd Toe Amputation at MTP Joint;  Surgeon: Newt Minion, MD;  Location: [facility];  Service: Orthopedics;  Laterality: Left;  . AMPUTATION Left 06/09/2016   Procedure: AMPUTATION LEFT THIRD TOE;  Surgeon: [name], MD;  Location: [facility];  Service: Orthopedics;  Laterality: Left;  . AMPUTATION Left 06/26/2016   Procedure: Left Foot Transmetatarsal Amputation;  Surgeon: Newt Minion, MD;  Location: [facility];  Service: Orthopedics;  Laterality: Left;  . APPLICATION OF WOUND VAC Left 12/19/2015   Procedure: APPLICATION OF WOUND VAC;  Surgeon: Meredith Pel, MD;  Location: [facility];  Service: Orthopedics;  Laterality: Left;  . CIRCUMCISION    . COLONOSCOPY N/A 01/22/2016   Procedure: COLONOSCOPY;  Surgeon: Gatha Mayer, MD;  Location: [facility];  Service: Endoscopy;  Laterality: N/A;  . ESOPHAGOGASTRODUODENOSCOPY N/A 01/19/2016   Procedure: ESOPHAGOGASTRODUODENOSCOPY (EGD);  Surgeon: Manus Gunning, MD;  Location: [facility];  Service: Gastroenterology;  Laterality: N/A;  . ESOPHAGOGASTRODUODENOSCOPY N/A 01/22/2016   Procedure: ESOPHAGOGASTRODUODENOSCOPY (EGD);  Surgeon: Gatha Mayer, MD;  Location[facility] ENDOSCOPY;  Service: Endoscopy;  Laterality: N/A;  . I&D EXTREMITY Left 12/02/2015   Procedure: IRRIGATION AND DEBRIDEMENT OF FOOT; LEFT SECOND TOE AMPUTATION;  Surgeon: Meredith Pel, MD;  Location: [facility];  Service: Orthopedics;  Laterality: Left;  . I&D EXTREMITY Left 12/19/2015   Procedure: I & D LEFT FOOT WITH BEADS ;  Surgeon: Meredith Pel, MD;  Location: [facility];  Service: Orthopedics;  Laterality: Left;  . I&D EXTREMITY Right 01/17/2016   Procedure: IRRIGATION AND DEBRIDEMENT RIGHT FOOT;  Surgeon: Newt Minion, MD;  Location: [facility];   Service: Orthopedics;  Laterality: Right;  . I&D EXTREMITY Right 01/21/2018   Procedure: IRRIGATION AND DEBRIDEMENT RIGHT FOOT;  Surgeon: Newt Minion, MD;  Location: [facility];  Service: Orthopedics;  Laterality: Right;  . INCISION AND DRAINAGE FOOT Right 01/17/2016  . INGUINAL HERNIA REPAIR Bilateral ~ 1983- ~ 1986  . TONSILLECTOMY  ~ 1985        Home Medications    Prior to Admission medications   Medication Sig Start Date End Date Taking? Authorizing Provider  acetaminophen (TYLENOL) 325 MG tablet Take 2 tablets (650 mg total) by mouth every 6 (six) hours as needed for mild pain, moderate pain or fever (or Fever >/= 101). 08/11/17   Hongalgi, Everlene Farrier D, MD  amLODipine (NORVASC) 5 MG tablet Take 1 tablet (5 mg total) by mouth daily. 02/02/18   Ladell Pier, MD  aspirin EC 81 MG tablet Take 1 tablet (81 mg total) by mouth daily. 09/16/16  Funches, Josalyn, MD  atorvastatin (LIPITOR) 80 MG tablet Take 1 tablet (80 mg total) by mouth every morning. 03/03/17   Charlott Rakes, MD  carbamazepine (TEGRETOL) 200 MG tablet TAKE 1 TABLET BY MOUTH AT BEDTIME. 01/01/18   Eksir, Richard Miu, MD  carvedilol (COREG) 25 MG tablet Take 1 tablet (25 mg total) by mouth 2 (two) times daily with a meal. 11/20/17   Charlott Rakes, MD  FLUoxetine (PROZAC) 40 MG capsule Take 1 capsule (40 mg total) by mouth daily. 08/06/17   Eksir, Richard Miu, MD  folic acid (FOLVITE) 1 MG tablet Take 1 tablet (1 mg total) by mouth daily. 07/04/16   Asencion Partridge, MD  gabapentin (NEURONTIN) 300 MG capsule Take 1 capsule (300 mg total) by mouth 2 (two) times daily. 11/20/17   Charlott Rakes, MD  glipiZIDE (GLUCOTROL XL) 10 MG 24 hr tablet Take 1 tablet (10 mg total) by mouth daily with breakfast. 11/20/17   Charlott Rakes, MD  Insulin Glargine (LANTUS SOLOSTAR) 100 UNIT/ML Solostar Pen Inject 35 Units into the skin 2 (two) times daily. Patient taking differently: Inject 40 Units into the skin daily.  11/20/17   Charlott Rakes, MD    lisinopril (PRINIVIL,ZESTRIL) 10 MG tablet Take 1 tablet (10 mg total) by mouth daily. 01/14/18   Steve Rattler, DO  methocarbamol (ROBAXIN) 500 MG tablet Take 1 tablet (500 mg total) by mouth every 6 (six) hours as needed for muscle spasms. 01/23/18   Rayburn, Neta Mends, PA-C  oxyCODONE (OXY IR/ROXICODONE) 5 MG immediate release tablet Take 1-2 tablets (5-10 mg total) by mouth every 4 (four) hours as needed for moderate pain (pain score 4-6). 01/23/18   Rayburn, Neta Mends, PA-C  pantoprazole (PROTONIX) 40 MG tablet Take 1 tablet (40 mg total) by mouth daily. 12/30/17   Charlott Rakes, MD  polyethylene glycol (MIRALAX / GLYCOLAX) packet Take 17 g by mouth daily as needed for moderate constipation. Patient taking differently: Take 17 g by mouth daily as needed for moderate constipation. Mix in 8 oz liquid and drink 07/03/16   Asencion Partridge, MD  senna-docusate (SENOKOT-S) 8.6-50 MG tablet Take 1 tablet by mouth at bedtime. Patient taking differently: Take 1 tablet by mouth at bedtime as needed for mild constipation.  07/03/16   Asencion Partridge, MD  sucralfate (CARAFATE) 1 g tablet TAKE 1 TABLET (1 G TOTAL) BY MOUTH 4 (FOUR) TIMES DAILY - WITH MEALS AND AT BEDTIME. 11/24/17   Esterwood, Amy S, PA-C  tamsulosin (FLOMAX) 0.4 MG CAPS capsule Take 1 capsule (0.4 mg total) by mouth daily. 01/14/18   Steve Rattler, DO  traZODone (DESYREL) 100 MG tablet Take 1 tablet (100 mg total) by mouth at bedtime. 01/13/18   Steve Rattler, DO  Vitamin D, Ergocalciferol, (DRISDOL) 50000 units CAPS capsule Take 1 capsule (50,000 Units total) by mouth See admin instructions. Take one capsule (50000 units) by mouth on the 1st Sunday of each month 08/11/17   Modena Jansky, MD    Family History Family History  Problem Relation Age of Onset  . Hypertension Mother   . Diabetes Mother   . Hyperlipidemia Mother   . Heart disease Mother        s/p pacemaker  . Diabetes Father   . Hypertension Father   .  Stroke Father   . Heart attack Father        first MI @ 84.  . Depression Father   . Dementia Father   . Stroke Brother   .  ADD / ADHD Brother   . Anxiety disorder Brother   . Bipolar disorder Brother   . OCD Brother   . Sexual abuse Brother     Social History Social History   Tobacco Use  . Smoking status: Never Smoker  . Smokeless tobacco: Never Used  Substance Use Topics  . Alcohol use: No    Alcohol/week: 0.0 standard drinks  . Drug use: No     Allergies   Other; Lactose intolerance (gi); and Milk-related compounds   Review of Systems Review of Systems  All other systems reviewed and are negative.    Physical Exam Updated Vital Signs BP (!) 176/113 (BP Location: Right Arm)   Pulse 90   Temp 98.5 F (36.9 C) (Oral)   Resp 16   SpO2 100%   Physical Exam  Constitutional: He is oriented to person, place, and time. He appears well-developed and well-nourished.  Non-toxic appearance. No distress.  HENT:  Head: Normocephalic and atraumatic.  Eyes: Pupils are equal, round, and reactive to light. Conjunctivae, EOM and lids are normal.  Neck: Normal range of motion. Neck supple. No tracheal deviation present. No thyroid mass present.  Cardiovascular: Normal rate, regular rhythm and normal heart sounds. Exam reveals no gallop.  No murmur heard. Pulmonary/Chest: Effort normal and breath sounds normal. No stridor. No respiratory distress. He has no decreased breath sounds. He has no wheezes. He has no rhonchi. He has no rales.  Abdominal: Soft. Normal appearance and bowel sounds are normal. He exhibits no distension. There is no tenderness. There is no rebound and no CVA tenderness.  Musculoskeletal: Normal range of motion. He exhibits no edema or tenderness.       Feet:  No sensation to deep palpation on his right foot.  Neurological: He is alert and oriented to person, place, and time. He has normal strength. No cranial nerve deficit or sensory deficit. GCS eye  subscore is 4. GCS verbal subscore is 5. GCS motor subscore is 6.  Skin: Skin is warm and dry. No abrasion and no rash noted.  Psychiatric: He has a normal mood and affect. His speech is normal and behavior is normal.  Nursing note and vitals reviewed.    ED Treatments / Results  Labs (all labs ordered are listed, but only abnormal results are displayed) Labs Reviewed  COMPREHENSIVE METABOLIC PANEL - Abnormal; Notable for the following components:      Result Value   Glucose, Bld 168 (*)    Creatinine, Ser 1.44 (*)    Albumin 3.2 (*)    AST 14 (*)    GFR calc non Af Amer 56 (*)    All other components within normal limits  CBC WITH DIFFERENTIAL/PLATELET - Abnormal; Notable for the following components:   RBC 3.91 (*)    Hemoglobin 11.7 (*)    HCT 37.5 (*)    Platelets 437 (*)    All other components within normal limits  I-STAT CG4 LACTIC ACID, ED  I-STAT TROPONIN, ED    EKG EKG Interpretation  Date/Time:  Sunday February 08 2018 13:14:51 EDT Ventricular Rate:  90 PR Interval:  148 QRS Duration: 86 QT Interval:  358 QTC Calculation: 437 R Axis:   49 Text Interpretation:  Normal sinus rhythm Minimal voltage criteria for LVH, may be normal variant Borderline ECG Confirmed by Lacretia Leigh ([id number]) on 02/08/2018 1:27:44 PM   Radiology Dg Chest 2 View  Result Date: 02/08/2018 CLINICAL DATA:  Chest pain EXAM: CHEST - 2 VIEW  COMPARISON:  01/24/2018 chest radiograph. FINDINGS: Stable cardiomediastinal silhouette with normal heart size. No pneumothorax. No pleural effusion. Lungs appear clear, with no acute consolidative airspace disease and no pulmonary edema. IMPRESSION: No active cardiopulmonary disease. Electronically Signed   By: Ilona Sorrel M.D.   On: 02/08/2018 14:00    Procedures Procedures (including critical care time)  Medications Ordered in ED Medications - No data to display   Initial Impression / Assessment and Plan / ED Course  I have reviewed the triage  vital signs and the nursing notes.  Pertinent labs & imaging results that were available during my care of the patient were reviewed by me and considered in my medical decision making (see chart for details).     Case discussed with Dr. Ninfa Linden on-call for Dr. Sharol Given and states that patient can be seen by Dr. Sharol Given  tomorrow the office.  Patient has no signs of compartment syndrome on his foot.  No evidence of infection either.  States that he frequently has chest pain but this is no different.  Patient delta troponin negative.  EKG without acute findings.  Return precautions given  Final Clinical Impressions(s) / ED Diagnoses   Final diagnoses:  None    ED Discharge Orders    None       Lacretia Leigh, MD 02/08/18 1851

## 2018-02-08 NOTE — ED Notes (Signed)
Patient complains of bleeding in right foot. EDP at bedside. Patients foot unwrapped by nurse. No active bleeding or discharge present. Patient complains of loss of sensation in foot.

## 2018-02-08 NOTE — ED Notes (Signed)
Pt transported to xray 

## 2018-02-08 NOTE — ED Notes (Signed)
Patient reported to EDP that his chest was not currently hurting and that pain is intermittent starting yesterday. Complaints of headache and loss of sensation in right foot.

## 2018-02-10 ENCOUNTER — Telehealth (INDEPENDENT_AMBULATORY_CARE_PROVIDER_SITE_OTHER): Payer: Self-pay | Admitting: Orthopedic Surgery

## 2018-02-10 DIAGNOSIS — E1140 Type 2 diabetes mellitus with diabetic neuropathy, unspecified: Secondary | ICD-10-CM | POA: Diagnosis not present

## 2018-02-10 DIAGNOSIS — I110 Hypertensive heart disease with heart failure: Secondary | ICD-10-CM | POA: Diagnosis not present

## 2018-02-10 DIAGNOSIS — K219 Gastro-esophageal reflux disease without esophagitis: Secondary | ICD-10-CM | POA: Diagnosis not present

## 2018-02-10 DIAGNOSIS — F319 Bipolar disorder, unspecified: Secondary | ICD-10-CM | POA: Diagnosis not present

## 2018-02-10 DIAGNOSIS — I5032 Chronic diastolic (congestive) heart failure: Secondary | ICD-10-CM | POA: Diagnosis not present

## 2018-02-10 DIAGNOSIS — F419 Anxiety disorder, unspecified: Secondary | ICD-10-CM | POA: Diagnosis not present

## 2018-02-10 DIAGNOSIS — E785 Hyperlipidemia, unspecified: Secondary | ICD-10-CM | POA: Diagnosis not present

## 2018-02-10 DIAGNOSIS — L02611 Cutaneous abscess of right foot: Secondary | ICD-10-CM | POA: Diagnosis not present

## 2018-02-10 DIAGNOSIS — D649 Anemia, unspecified: Secondary | ICD-10-CM | POA: Diagnosis not present

## 2018-02-10 NOTE — Telephone Encounter (Signed)
Gave verbal ok for [facility] to do dial soap cleansing and dry dressing changes. Pt has an appt on Thursday will hold this message pending his apt and notify of any changes to those orders.

## 2018-02-10 NOTE — Telephone Encounter (Signed)
Donita, nurse at [facility] is requesting to continue seeing this patient with present orders until the week of October 11th. Shes asking for  1 x for 6 more weeks.  She also says patient has new wounds but has appt on Thursday.   Her CB # [phone]

## 2018-02-11 ENCOUNTER — Other Ambulatory Visit: Payer: Self-pay | Admitting: Family Medicine

## 2018-02-12 ENCOUNTER — Encounter (INDEPENDENT_AMBULATORY_CARE_PROVIDER_SITE_OTHER): Payer: Self-pay | Admitting: Orthopedic Surgery

## 2018-02-12 ENCOUNTER — Ambulatory Visit (INDEPENDENT_AMBULATORY_CARE_PROVIDER_SITE_OTHER): Payer: Medicare HMO | Admitting: Physician Assistant

## 2018-02-12 DIAGNOSIS — E11621 Type 2 diabetes mellitus with foot ulcer: Secondary | ICD-10-CM

## 2018-02-12 DIAGNOSIS — L97413 Non-pressure chronic ulcer of right heel and midfoot with necrosis of muscle: Secondary | ICD-10-CM

## 2018-02-13 ENCOUNTER — Encounter (INDEPENDENT_AMBULATORY_CARE_PROVIDER_SITE_OTHER): Payer: Self-pay | Admitting: Physician Assistant

## 2018-02-13 NOTE — Progress Notes (Addendum)
Office Visit Note   Patient: Shane [name]           Date of Birth: [birth year]           MRN: [id number] Visit Date: 02/12/2018              Requested by: Charlott Rakes, MD [place], [place] [id number] PCP: Charlott Rakes, MD   Assessment & Plan: Visit Diagnoses:  1. 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  The patient will continue to wash the foot with soap and water pat it dry and then apply Silvadene cream to the areas once daily.  The patient reports he already has Silvadene cream at home and does not need a prescription.  Continue on doxycycline 100 mg p.o. twice daily.  Continue to elevate as much as possible.  They can cover the area with gauze and Ace wrap for compression to improve the edema residually.  He should continue to utilize the postoperative shoe.  He will follow-up in 2 weeks.  Follow-Up Instructions: Return in about 2 weeks (around 02/26/2018).   Orders:  No orders of the defined types were placed in this encounter.  No orders of the defined types were placed in this encounter.     Procedures: No procedures performed   Clinical Data: No additional findings.   Subjective: Chief Complaint  Patient presents with  . Right Foot - Routine Post Op, Pain    HPI Patient is a 47 year old male who is seen for postoperative follow-up following right foot irrigation and debridement on 01/21/2018.  He reports continued pain in the foot and is concerned about some areas over the webspace between the fourth and fifth toe which is dry and cracked as well as a dry cracked area over the plantar surface of the head of the fifth metatarsal or first metatarsal.  He reports he is continued to have some sharp pain intermittently.  He reports he has been ambulating in his postop shoe and we reinforced that he needs to try to stay off and elevate  the foot as much as possible.  He is concerned about recurrent infection.  He is on doxycycline 100 mg p.o. twice daily.  He has been to the emergency room over the past week and was evaluated and was not felt to have active infection.  Review of Systems   Objective: Vital Signs: There were no vitals taken for this visit.  Physical Exam On examination he appears well-nourished well-developed.  He is wearing his postoperative shoe.  He is alert and appropriate but anxious. Ortho Exam And examination of the right the sutures are intact but he continues to have some edema of the foot.  There is no signs of cellulitis.  He does have some minimal serous drainage.  He he has some dry crusting and cracking of the webspace between the fourth and fifth toe as well as over the plantar surface of the foot.  They were doing some dry dressing changes daily and we are going to add in some Silvadene cream to the incisional areas. Specialty Comments:  No specialty comments available.  Imaging: No results found.   PMFS History: Patient Active Problem List   Diagnosis Date Noted  . Hypoxia   . Abscess of right foot 01/19/2018  . Depression   . PAD (peripheral artery disease) ([place])   .  Midfoot skin ulcer, right, limited to breakdown of skin ([place]) 08/14/2017  . Hypertension, uncontrolled 08/08/2017  . Cellulitis of foot, right 08/07/2017  . Bulging lumbar disc 07/04/2017  . Weakness of right lower extremity 05/19/2017  . Cellulitis 05/18/2017  . Achilles tendon contracture, left 01/14/2017  . Gingivitis 11/14/2016  . Achilles tendon contracture, right 09/17/2016  . Pain and swelling of left lower leg 09/16/2016  . CKD (chronic kidney disease) [name], GFR 30-59 ml/min (HCC) 08/27/2016  . Type 2 diabetes mellitus without complication, with long-term current use of insulin ([place]) 07/22/2016  . Constipation 07/02/2016  . S/P transmetatarsal amputation of foot, left ([place]) 07/02/2016  . Shortness of  breath 07/02/2016  . Diabetic gastroparesis ([place]) 05/30/2016  . Buzzing in ear, right 03/26/2016  . Joint pain 03/11/2016  . Weakness 03/08/2016  . Dizziness 03/03/2016  . Chronic diastolic CHF (congestive heart failure), NYHA class 1 ([place]) 12/30/2015  . Diabetes mellitus type 2, uncontrolled, with complications ([place]) 28/36/6294  . Depression with anxiety 12/01/2015  . Pain in the chest 12/01/2015  . Insomnia 11/21/2015  . GERD (gastroesophageal reflux disease) 08/18/2015  . Erectile dysfunction 07/04/2015  . Symptomatic anemia 04/24/2015  . AKI (acute kidney injury) ([place]) 04/24/2015  . Left arm weakness 04/02/2015  . Sensory disturbance 04/02/2015  . Essential hypertension 04/02/2015  . Hemispheric carotid artery syndrome   . HLD (hyperlipidemia)   . Headache    Past Medical History:  Diagnosis Date  . Anemia   . Bipolar disorder ([place])   . Cellulitis 05/2017   RIGHT FOOT   DIABETIC ULCER   . Cellulitis and abscess of left leg 06/2016  . Chest pain    a. 2015 Reportedly normal stress test in [place].  Marland Kitchen Chronic diastolic CHF (congestive heart failure) (HCC)    a.03/2015 Echo: EF 55-60%, Gr 1 DD, mild MR, triv PR.  . Depression   . Depression with anxiety   . Dyspnea    with exertion  . GERD (gastroesophageal reflux disease)   . Glaucoma   . Hyperlipidemia   . Hypertension    a. 08/2014 Admitted with hypertensive urgency.  . Insomnia   . Internal carotid artery stenosis   . Lower GI bleed 07/04/2015  . Neuropathy   . Refusal of blood transfusions as patient is Jehovah's Witness   . TIA (transient ischemic attack) 08/2014; 03/2015   a. 08/2014 in setting of hypertensive urgency.  . Type II diabetes mellitus ([place])    started insulin spring 2016, Type II  . Vitamin D deficiency spring 2016    Family History  Problem Relation Age of Onset  . Hypertension Mother   . Diabetes Mother   . Hyperlipidemia Mother   . Heart disease Mother        s/p pacemaker  . Diabetes Father    . Hypertension Father   . Stroke Father   . Heart attack Father        first MI @ 29.  . Depression Father   . Dementia Father   . Stroke Brother   . ADD / ADHD Brother   . Anxiety disorder Brother   . Bipolar disorder Brother   . OCD Brother   . Sexual abuse Brother     Past Surgical History:  Procedure Laterality Date  . AMPUTATION Left 08/03/2015   Procedure: AMPUTATION LEFT GREAT TOE;  Surgeon: Newt Minion, MD;  Location: [place];  Service: Orthopedics;  Laterality: Left;  . AMPUTATION Left 12/02/2015  Procedure: amputation of left 2nd digit  . AMPUTATION Left 04/10/2016   Procedure: Left 2nd Toe Amputation at MTP Joint;  Surgeon: Newt Minion, MD;  Location: [facility];  Service: Orthopedics;  Laterality: Left;  . AMPUTATION Left 06/09/2016   Procedure: AMPUTATION LEFT THIRD TOE;  Surgeon: [name], MD;  Location: [place];  Service: Orthopedics;  Laterality: Left;  . AMPUTATION Left 06/26/2016   Procedure: Left Foot Transmetatarsal Amputation;  Surgeon: Newt Minion, MD;  Location: [facility];  Service: Orthopedics;  Laterality: Left;  . APPLICATION OF WOUND VAC Left 12/19/2015   Procedure: APPLICATION OF WOUND VAC;  Surgeon: Meredith Pel, MD;  Location: [facility];  Service: Orthopedics;  Laterality: Left;  . CIRCUMCISION    . COLONOSCOPY N/A 01/22/2016   Procedure: COLONOSCOPY;  Surgeon: Gatha Mayer, MD;  Location: [facility];  Service: Endoscopy;  Laterality: N/A;  . ESOPHAGOGASTRODUODENOSCOPY N/A 01/19/2016   Procedure: ESOPHAGOGASTRODUODENOSCOPY (EGD);  Surgeon: Manus Gunning, MD;  Location: [facility];  Service: Gastroenterology;  Laterality: N/A;  . ESOPHAGOGASTRODUODENOSCOPY N/A 01/22/2016   Procedure: ESOPHAGOGASTRODUODENOSCOPY (EGD);  Surgeon: Gatha Mayer, MD;  Location[facility] ENDOSCOPY;  Service: Endoscopy;  Laterality: N/A;  . I&D EXTREMITY Left 12/02/2015   Procedure: IRRIGATION AND DEBRIDEMENT OF FOOT; LEFT SECOND TOE AMPUTATION;  Surgeon: Meredith Pel,  MD;  Location: [place];  Service: Orthopedics;  Laterality: Left;  . I&D EXTREMITY Left 12/19/2015   Procedure: I & D LEFT FOOT WITH BEADS ;  Surgeon: Meredith Pel, MD;  Location: [place];  Service: Orthopedics;  Laterality: Left;  . I&D EXTREMITY Right 01/17/2016   Procedure: IRRIGATION AND DEBRIDEMENT RIGHT FOOT;  Surgeon: Newt Minion, MD;  Location: [place];  Service: Orthopedics;  Laterality: Right;  . I&D EXTREMITY Right 01/21/2018   Procedure: IRRIGATION AND DEBRIDEMENT RIGHT FOOT;  Surgeon: Newt Minion, MD;  Location: [place];  Service: Orthopedics;  Laterality: Right;  . INCISION AND DRAINAGE FOOT Right 01/17/2016  . INGUINAL HERNIA REPAIR Bilateral ~ 1983- ~ 1986  . TONSILLECTOMY  ~ 71   Social History   Occupational History  . Occupation: disabled  Tobacco Use  . Smoking status: Never Smoker  . Smokeless tobacco: Never Used  Substance and Sexual Activity  . Alcohol use: No    Alcohol/week: 0.0 standard drinks  . Drug use: No  . Sexual activity: Not Currently

## 2018-02-18 ENCOUNTER — Inpatient Hospital Stay (HOSPITAL_COMMUNITY)
Admission: EM | Admit: 2018-02-18 | Discharge: 2018-02-22 | DRG: 638 | Disposition: A | Discharge status: Home / Self Care | Payer: Medicare HMO | Attending: Internal Medicine | Admitting: Internal Medicine

## 2018-02-18 ENCOUNTER — Emergency Department (HOSPITAL_COMMUNITY): Payer: Medicare HMO

## 2018-02-18 ENCOUNTER — Encounter (HOSPITAL_COMMUNITY): Payer: Self-pay | Admitting: Emergency Medicine

## 2018-02-18 ENCOUNTER — Other Ambulatory Visit: Payer: Self-pay

## 2018-02-18 DIAGNOSIS — Z794 Long term (current) use of insulin: Secondary | ICD-10-CM

## 2018-02-18 DIAGNOSIS — Z8349 Family history of other endocrine, nutritional and metabolic diseases: Secondary | ICD-10-CM

## 2018-02-18 DIAGNOSIS — M79671 Pain in right foot: Secondary | ICD-10-CM | POA: Diagnosis not present

## 2018-02-18 DIAGNOSIS — N183 Chronic kidney disease, stage 3 unspecified: Secondary | ICD-10-CM | POA: Diagnosis present

## 2018-02-18 DIAGNOSIS — L03115 Cellulitis of right lower limb: Secondary | ICD-10-CM | POA: Diagnosis not present

## 2018-02-18 DIAGNOSIS — I5032 Chronic diastolic (congestive) heart failure: Secondary | ICD-10-CM | POA: Diagnosis not present

## 2018-02-18 DIAGNOSIS — Z8673 Personal history of transient ischemic attack (TIA), and cerebral infarction without residual deficits: Secondary | ICD-10-CM | POA: Diagnosis not present

## 2018-02-18 DIAGNOSIS — H409 Unspecified glaucoma: Secondary | ICD-10-CM | POA: Diagnosis present

## 2018-02-18 DIAGNOSIS — G4700 Insomnia, unspecified: Secondary | ICD-10-CM | POA: Diagnosis present

## 2018-02-18 DIAGNOSIS — Z818 Family history of other mental and behavioral disorders: Secondary | ICD-10-CM | POA: Diagnosis not present

## 2018-02-18 DIAGNOSIS — K219 Gastro-esophageal reflux disease without esophagitis: Secondary | ICD-10-CM | POA: Diagnosis not present

## 2018-02-18 DIAGNOSIS — IMO0002 Reserved for concepts with insufficient information to code with codable children: Secondary | ICD-10-CM | POA: Diagnosis present

## 2018-02-18 DIAGNOSIS — M7989 Other specified soft tissue disorders: Secondary | ICD-10-CM | POA: Diagnosis not present

## 2018-02-18 DIAGNOSIS — E1151 Type 2 diabetes mellitus with diabetic peripheral angiopathy without gangrene: Secondary | ICD-10-CM | POA: Diagnosis present

## 2018-02-18 DIAGNOSIS — Z823 Family history of stroke: Secondary | ICD-10-CM | POA: Diagnosis not present

## 2018-02-18 DIAGNOSIS — L97519 Non-pressure chronic ulcer of other part of right foot with unspecified severity: Secondary | ICD-10-CM | POA: Diagnosis present

## 2018-02-18 DIAGNOSIS — E118 Type 2 diabetes mellitus with unspecified complications: Secondary | ICD-10-CM

## 2018-02-18 DIAGNOSIS — Z993 Dependence on wheelchair: Secondary | ICD-10-CM

## 2018-02-18 DIAGNOSIS — Z8249 Family history of ischemic heart disease and other diseases of the circulatory system: Secondary | ICD-10-CM

## 2018-02-18 DIAGNOSIS — F418 Other specified anxiety disorders: Secondary | ICD-10-CM | POA: Diagnosis present

## 2018-02-18 DIAGNOSIS — F319 Bipolar disorder, unspecified: Secondary | ICD-10-CM | POA: Diagnosis present

## 2018-02-18 DIAGNOSIS — E1142 Type 2 diabetes mellitus with diabetic polyneuropathy: Secondary | ICD-10-CM | POA: Diagnosis present

## 2018-02-18 DIAGNOSIS — E559 Vitamin D deficiency, unspecified: Secondary | ICD-10-CM | POA: Diagnosis present

## 2018-02-18 DIAGNOSIS — I130 Hypertensive heart and chronic kidney disease with heart failure and stage 1 through stage 4 chronic kidney disease, or unspecified chronic kidney disease: Secondary | ICD-10-CM | POA: Diagnosis present

## 2018-02-18 DIAGNOSIS — I10 Essential (primary) hypertension: Secondary | ICD-10-CM | POA: Diagnosis present

## 2018-02-18 DIAGNOSIS — I739 Peripheral vascular disease, unspecified: Secondary | ICD-10-CM | POA: Diagnosis present

## 2018-02-18 DIAGNOSIS — Z833 Family history of diabetes mellitus: Secondary | ICD-10-CM | POA: Diagnosis not present

## 2018-02-18 DIAGNOSIS — E785 Hyperlipidemia, unspecified: Secondary | ICD-10-CM | POA: Diagnosis present

## 2018-02-18 DIAGNOSIS — E1122 Type 2 diabetes mellitus with diabetic chronic kidney disease: Secondary | ICD-10-CM | POA: Diagnosis present

## 2018-02-18 DIAGNOSIS — Z7982 Long term (current) use of aspirin: Secondary | ICD-10-CM

## 2018-02-18 DIAGNOSIS — E11621 Type 2 diabetes mellitus with foot ulcer: Secondary | ICD-10-CM | POA: Diagnosis not present

## 2018-02-18 DIAGNOSIS — Z531 Procedure and treatment not carried out because of patient's decision for reasons of belief and group pressure: Secondary | ICD-10-CM | POA: Diagnosis present

## 2018-02-18 DIAGNOSIS — E739 Lactose intolerance, unspecified: Secondary | ICD-10-CM | POA: Diagnosis present

## 2018-02-18 DIAGNOSIS — E1165 Type 2 diabetes mellitus with hyperglycemia: Secondary | ICD-10-CM | POA: Diagnosis present

## 2018-02-18 LAB — CBC WITH DIFFERENTIAL/PLATELET
Abs Immature Granulocytes: 0 10*3/uL (ref 0.0–0.1)
BASOS ABS: 0.1 10*3/uL (ref 0.0–0.1)
BASOS PCT: 1 %
EOS PCT: 1 %
Eosinophils Absolute: 0.1 10*3/uL (ref 0.0–0.7)
HCT: 39.2 % (ref 39.0–52.0)
HEMOGLOBIN: 12 g/dL — AB (ref 13.0–17.0)
Immature Granulocytes: 0 %
Lymphocytes Relative: 12 %
Lymphs Abs: 1.2 10*3/uL (ref 0.7–4.0)
MCH: 29.4 pg (ref 26.0–34.0)
MCHC: 30.6 g/dL (ref 30.0–36.0)
MCV: 96.1 fL (ref 78.0–100.0)
MONO ABS: 0.4 10*3/uL (ref 0.1–1.0)
MONOS PCT: 4 %
Neutro Abs: 8 10*3/uL — ABNORMAL HIGH (ref 1.7–7.7)
Neutrophils Relative %: 82 %
Platelets: 300 10*3/uL (ref 150–400)
RBC: 4.08 MIL/uL — ABNORMAL LOW (ref 4.22–5.81)
RDW: 12.3 % (ref 11.5–15.5)
WBC: 9.8 10*3/uL (ref 4.0–10.5)

## 2018-02-18 LAB — BASIC METABOLIC PANEL
Anion gap: 7 (ref 5–15)
BUN: 15 mg/dL (ref 6–20)
CALCIUM: 9.3 mg/dL (ref 8.9–10.3)
CHLORIDE: 101 mmol/L (ref 98–111)
CO2: 30 mmol/L (ref 22–32)
CREATININE: 1.4 mg/dL — AB (ref 0.61–1.24)
GFR calc Af Amer: >60 mL/min (ref >60)
GFR calc non Af Amer: 58 mL/min — ABNORMAL LOW (ref >60)
Glucose, Bld: 171 mg/dL — ABNORMAL HIGH (ref 70–99)
Potassium: 4.4 mmol/L (ref 3.5–5.1)
SODIUM: 138 mmol/L (ref 135–145)

## 2018-02-18 LAB — C-REACTIVE PROTEIN: CRP: 7.3 mg/dL — AB (ref <1.0)

## 2018-02-18 LAB — SEDIMENTATION RATE: SED RATE: 112 mm/h — AB (ref 0–16)

## 2018-02-18 LAB — GLUCOSE, CAPILLARY
Glucose-Capillary: 89 mg/dL (ref 70–99)
Glucose-Capillary: 133 mg/dL — ABNORMAL HIGH (ref 70–99)

## 2018-02-18 MED ORDER — TRAZODONE HCL 100 MG PO TABS
100.0000 mg | ORAL_TABLET | Freq: Every day | ORAL | Status: DC
  Administered 2018-02-18 – 2018-02-21 (×4): 100 mg via ORAL
  Filled 2018-02-18 (×4): qty 1

## 2018-02-18 MED ORDER — ASPIRIN EC 81 MG PO TBEC
81.0000 mg | DELAYED_RELEASE_TABLET | Freq: Every day | ORAL | Status: DC
  Administered 2018-02-19 – 2018-02-22 (×4): 81 mg via ORAL
  Filled 2018-02-18 (×4): qty 1

## 2018-02-18 MED ORDER — ENOXAPARIN SODIUM 40 MG/0.4ML ~~LOC~~ SOLN
40.0000 mg | SUBCUTANEOUS | Status: DC
  Administered 2018-02-18: 40 mg via SUBCUTANEOUS
  Filled 2018-02-18: qty 0.4

## 2018-02-18 MED ORDER — OXYCODONE HCL 5 MG PO TABS
5.0000 mg | ORAL_TABLET | ORAL | Status: DC | PRN
  Administered 2018-02-18 – 2018-02-22 (×7): 10 mg via ORAL
  Filled 2018-02-18 (×9): qty 2

## 2018-02-18 MED ORDER — INSULIN ASPART 100 UNIT/ML ~~LOC~~ SOLN
0.0000 [IU] | Freq: Every day | SUBCUTANEOUS | Status: DC
  Administered 2018-02-21: 3 [IU] via SUBCUTANEOUS

## 2018-02-18 MED ORDER — METHOCARBAMOL 500 MG PO TABS
500.0000 mg | ORAL_TABLET | Freq: Four times a day (QID) | ORAL | Status: DC | PRN
  Administered 2018-02-22: 500 mg via ORAL
  Filled 2018-02-18: qty 1

## 2018-02-18 MED ORDER — GABAPENTIN 300 MG PO CAPS
300.0000 mg | ORAL_CAPSULE | Freq: Every day | ORAL | Status: DC
  Administered 2018-02-18 – 2018-02-21 (×4): 300 mg via ORAL
  Filled 2018-02-18 (×4): qty 1

## 2018-02-18 MED ORDER — PIPERACILLIN-TAZOBACTAM 3.375 G IVPB
3.3750 g | Freq: Three times a day (TID) | INTRAVENOUS | Status: DC
  Administered 2018-02-18 – 2018-02-21 (×9): 3.375 g via INTRAVENOUS
  Filled 2018-02-18 (×9): qty 50

## 2018-02-18 MED ORDER — SUCRALFATE 1 G PO TABS
1.0000 g | ORAL_TABLET | Freq: Two times a day (BID) | ORAL | Status: DC
  Administered 2018-02-18 – 2018-02-22 (×8): 1 g via ORAL
  Filled 2018-02-18 (×8): qty 1

## 2018-02-18 MED ORDER — POLYETHYLENE GLYCOL 3350 17 G PO PACK: 17.0000 g | PACK | Freq: Every day | ORAL | Status: DC | PRN

## 2018-02-18 MED ORDER — LISINOPRIL 10 MG PO TABS
10.0000 mg | ORAL_TABLET | Freq: Every day | ORAL | Status: DC
  Administered 2018-02-19 – 2018-02-22 (×4): 10 mg via ORAL
  Filled 2018-02-18 (×4): qty 1

## 2018-02-18 MED ORDER — VANCOMYCIN HCL IN DEXTROSE 1-5 GM/200ML-% IV SOLN: 1000.0000 mg | Freq: Once | INTRAVENOUS | Status: DC

## 2018-02-18 MED ORDER — PIPERACILLIN-TAZOBACTAM 3.375 G IVPB 30 MIN
3.3750 g | Freq: Once | INTRAVENOUS | Status: AC
  Administered 2018-02-18: 3.375 g via INTRAVENOUS
  Filled 2018-02-18: qty 50

## 2018-02-18 MED ORDER — ATORVASTATIN CALCIUM 80 MG PO TABS
80.0000 mg | ORAL_TABLET | Freq: Every morning | ORAL | Status: DC
  Administered 2018-02-19 – 2018-02-22 (×4): 80 mg via ORAL
  Filled 2018-02-18 (×4): qty 1

## 2018-02-18 MED ORDER — INSULIN GLARGINE 100 UNIT/ML ~~LOC~~ SOLN
46.0000 [IU] | Freq: Every day | SUBCUTANEOUS | Status: DC
  Administered 2018-02-18 – 2018-02-22 (×4): 46 [IU] via SUBCUTANEOUS
  Filled 2018-02-18 (×4): qty 0.46

## 2018-02-18 MED ORDER — FOLIC ACID 1 MG PO TABS
1.0000 mg | ORAL_TABLET | Freq: Every day | ORAL | Status: DC
  Administered 2018-02-18 – 2018-02-22 (×5): 1 mg via ORAL
  Filled 2018-02-18 (×5): qty 1

## 2018-02-18 MED ORDER — INSULIN GLARGINE 100 UNIT/ML SOLOSTAR PEN: 46.0000 [IU] | PEN_INJECTOR | Freq: Every day | SUBCUTANEOUS | Status: DC

## 2018-02-18 MED ORDER — TAMSULOSIN HCL 0.4 MG PO CAPS
0.4000 mg | ORAL_CAPSULE | Freq: Every day | ORAL | Status: DC
  Administered 2018-02-19 – 2018-02-22 (×4): 0.4 mg via ORAL
  Filled 2018-02-18 (×4): qty 1

## 2018-02-18 MED ORDER — PANTOPRAZOLE SODIUM 40 MG PO TBEC
40.0000 mg | DELAYED_RELEASE_TABLET | Freq: Every day | ORAL | Status: DC
  Administered 2018-02-19 – 2018-02-22 (×4): 40 mg via ORAL
  Filled 2018-02-18 (×4): qty 1

## 2018-02-18 MED ORDER — INSULIN ASPART 100 UNIT/ML ~~LOC~~ SOLN
0.0000 [IU] | Freq: Three times a day (TID) | SUBCUTANEOUS | Status: DC
  Administered 2018-02-19: 2 [IU] via SUBCUTANEOUS
  Administered 2018-02-19: 1 [IU] via SUBCUTANEOUS
  Administered 2018-02-19: 2 [IU] via SUBCUTANEOUS
  Administered 2018-02-20: 3 [IU] via SUBCUTANEOUS
  Administered 2018-02-21: 1 [IU] via SUBCUTANEOUS
  Administered 2018-02-21: 2 [IU] via SUBCUTANEOUS
  Administered 2018-02-22: 1 [IU] via SUBCUTANEOUS
  Administered 2018-02-22: 2 [IU] via SUBCUTANEOUS

## 2018-02-18 MED ORDER — AMLODIPINE BESYLATE 5 MG PO TABS
5.0000 mg | ORAL_TABLET | Freq: Every day | ORAL | Status: DC
  Administered 2018-02-19 – 2018-02-22 (×4): 5 mg via ORAL
  Filled 2018-02-18 (×4): qty 1

## 2018-02-18 MED ORDER — ENOXAPARIN SODIUM 40 MG/0.4ML ~~LOC~~ SOLN
40.0000 mg | SUBCUTANEOUS | Status: DC
  Administered 2018-02-19 – 2018-02-21 (×3): 40 mg via SUBCUTANEOUS
  Filled 2018-02-18 (×3): qty 0.4

## 2018-02-18 MED ORDER — CARVEDILOL 25 MG PO TABS
25.0000 mg | ORAL_TABLET | Freq: Every day | ORAL | Status: DC
  Administered 2018-02-19 – 2018-02-22 (×4): 25 mg via ORAL
  Filled 2018-02-18 (×4): qty 1

## 2018-02-18 MED ORDER — VANCOMYCIN HCL IN DEXTROSE 1-5 GM/200ML-% IV SOLN
1000.0000 mg | Freq: Two times a day (BID) | INTRAVENOUS | Status: DC
  Administered 2018-02-19 – 2018-02-21 (×5): 1000 mg via INTRAVENOUS
  Filled 2018-02-18 (×5): qty 200

## 2018-02-18 MED ORDER — CARBAMAZEPINE 200 MG PO TABS
200.0000 mg | ORAL_TABLET | Freq: Every day | ORAL | Status: DC
  Administered 2018-02-18 – 2018-02-21 (×4): 200 mg via ORAL
  Filled 2018-02-18 (×4): qty 1

## 2018-02-18 MED ORDER — FLUOXETINE HCL 20 MG PO CAPS
40.0000 mg | ORAL_CAPSULE | Freq: Every day | ORAL | Status: DC
  Administered 2018-02-19 – 2018-02-22 (×4): 40 mg via ORAL
  Filled 2018-02-18 (×4): qty 2

## 2018-02-18 MED ORDER — VANCOMYCIN HCL 10 G IV SOLR
1500.0000 mg | INTRAVENOUS | Status: AC
  Administered 2018-02-18: 1500 mg via INTRAVENOUS
  Filled 2018-02-18: qty 1500

## 2018-02-18 MED ORDER — SENNOSIDES-DOCUSATE SODIUM 8.6-50 MG PO TABS: 1.0000 | ORAL_TABLET | Freq: Every evening | ORAL | Status: DC | PRN

## 2018-02-18 MED ORDER — SODIUM CHLORIDE 0.9 % IV SOLN
INTRAVENOUS | Status: DC
  Administered 2018-02-18 – 2018-02-20 (×4): via INTRAVENOUS

## 2018-02-18 MED ORDER — ACETAMINOPHEN 500 MG PO TABS: 500.0000 mg | ORAL_TABLET | Freq: Four times a day (QID) | ORAL | Status: DC | PRN

## 2018-02-18 NOTE — Telephone Encounter (Signed)
Pt had been advised last week to wash with soap and water and apply silvadene dressing to incision and change daily.

## 2018-02-18 NOTE — ED Provider Notes (Signed)
[facility] EMERGENCY DEPARTMENT Provider Note   CSN: [id number] Arrival date & time: 02/18/18  6295     History   Chief Complaint Chief Complaint  Patient presents with  . Wound Infection    HPI Shane [name] is a 47 y.o. male.  HPI Patient with right foot ulceration states that it opened on 4 days ago and started draining purulent fluid.  Has had increased swelling to the great toe of the right foot and redness.  Endorses subjective fevers.  States he has now has numbness to the entire great toe.  Has been seen by Dr. Sharol Given in the past. Past Medical History:  Diagnosis Date  . Anemia   . Bipolar disorder ([place])   . Cellulitis 05/2017   RIGHT FOOT   DIABETIC ULCER   . Cellulitis and abscess of left leg 06/2016  . Chest pain    a. 2015 Reportedly normal stress test in [place].  Marland Kitchen Chronic diastolic CHF (congestive heart failure) (HCC)    a.03/2015 Echo: EF 55-60%, Gr 1 DD, mild MR, triv PR.  . Depression   . Depression with anxiety   . Dyspnea    with exertion  . GERD (gastroesophageal reflux disease)   . Glaucoma   . Hyperlipidemia   . Hypertension    a. 08/2014 Admitted with hypertensive urgency.  . Insomnia   . Internal carotid artery stenosis   . Lower GI bleed 07/04/2015  . Neuropathy   . Refusal of blood transfusions as patient is Jehovah's Witness   . TIA (transient ischemic attack) 08/2014; 03/2015   a. 08/2014 in setting of hypertensive urgency.  . Type II diabetes mellitus ([place])    started insulin spring 2016, Type II  . Vitamin D deficiency spring 2016    Patient Active Problem List   Diagnosis Date Noted  . Hypoxia   . Abscess of right foot 01/19/2018  . Depression   . PAD (peripheral artery disease) ([place])   . Midfoot skin ulcer, right, limited to breakdown of skin ([place]) 08/14/2017  . Hypertension, uncontrolled 08/08/2017  . Cellulitis of foot, right 08/07/2017  . Bulging lumbar disc 07/04/2017  . Weakness of right lower extremity  05/19/2017  . Cellulitis 05/18/2017  . Achilles tendon contracture, left 01/14/2017  . Gingivitis 11/14/2016  . Achilles tendon contracture, right 09/17/2016  . Pain and swelling of left lower leg 09/16/2016  . CKD (chronic kidney disease) stage 3, GFR 30-59 ml/min (HCC) 08/27/2016  . Type 2 diabetes mellitus without complication, with long-term current use of insulin ([place]) 07/22/2016  . Constipation 07/02/2016  . S/P transmetatarsal amputation of foot, left ([place]) 07/02/2016  . Shortness of breath 07/02/2016  . Diabetic gastroparesis ([place]) 05/30/2016  . Buzzing in ear, right 03/26/2016  . Joint pain 03/11/2016  . Weakness 03/08/2016  . Dizziness 03/03/2016  . Chronic diastolic CHF (congestive heart failure), NYHA class 1 ([place]) 12/30/2015  . Diabetes mellitus type 2, uncontrolled, with complications ([place]) 28/41/3244  . Depression with anxiety 12/01/2015  . Pain in the chest 12/01/2015  . Insomnia 11/21/2015  . GERD (gastroesophageal reflux disease) 08/18/2015  . Erectile dysfunction 07/04/2015  . Symptomatic anemia 04/24/2015  . AKI (acute kidney injury) ([place]) 04/24/2015  . Left arm weakness 04/02/2015  . Sensory disturbance 04/02/2015  . Essential hypertension 04/02/2015  . Hemispheric carotid artery syndrome   . HLD (hyperlipidemia)   . Headache     Past Surgical History:  Procedure Laterality Date  . AMPUTATION Left 08/03/2015  Procedure: AMPUTATION LEFT GREAT TOE;  Surgeon: Newt Minion, MD;  Location: [place];  Service: Orthopedics;  Laterality: Left;  . AMPUTATION Left 12/02/2015   Procedure: amputation of left 2nd digit  . AMPUTATION Left 04/10/2016   Procedure: Left 2nd Toe Amputation at MTP Joint;  Surgeon: Newt Minion, MD;  Location: [facility];  Service: Orthopedics;  Laterality: Left;  . AMPUTATION Left 06/09/2016   Procedure: AMPUTATION LEFT THIRD TOE;  Surgeon: [name], MD;  Location: [facility];  Service: Orthopedics;  Laterality: Left;  . AMPUTATION Left 06/26/2016    Procedure: Left Foot Transmetatarsal Amputation;  Surgeon: Newt Minion, MD;  Location: [facility];  Service: Orthopedics;  Laterality: Left;  . APPLICATION OF WOUND VAC Left 12/19/2015   Procedure: APPLICATION OF WOUND VAC;  Surgeon: Meredith Pel, MD;  Location: [facility];  Service: Orthopedics;  Laterality: Left;  . CIRCUMCISION    . COLONOSCOPY N/A 01/22/2016   Procedure: COLONOSCOPY;  Surgeon: Gatha Mayer, MD;  Location: [facility];  Service: Endoscopy;  Laterality: N/A;  . ESOPHAGOGASTRODUODENOSCOPY N/A 01/19/2016   Procedure: ESOPHAGOGASTRODUODENOSCOPY (EGD);  Surgeon: Manus Gunning, MD;  Location: [place];  Service: Gastroenterology;  Laterality: N/A;  . ESOPHAGOGASTRODUODENOSCOPY N/A 01/22/2016   Procedure: ESOPHAGOGASTRODUODENOSCOPY (EGD);  Surgeon: Gatha Mayer, MD;  Location: [facility] ENDOSCOPY;  Service: Endoscopy;  Laterality: N/A;  . I&D EXTREMITY Left 12/02/2015   Procedure: IRRIGATION AND DEBRIDEMENT OF FOOT; LEFT SECOND TOE AMPUTATION;  Surgeon: Meredith Pel, MD;  Location: [place];  Service: Orthopedics;  Laterality: Left;  . I&D EXTREMITY Left 12/19/2015   Procedure: I & D LEFT FOOT WITH BEADS ;  Surgeon: Meredith Pel, MD;  Location: [place];  Service: Orthopedics;  Laterality: Left;  . I&D EXTREMITY Right 01/17/2016   Procedure: IRRIGATION AND DEBRIDEMENT RIGHT FOOT;  Surgeon: Newt Minion, MD;  Location: [place];  Service: Orthopedics;  Laterality: Right;  . I&D EXTREMITY Right 01/21/2018   Procedure: IRRIGATION AND DEBRIDEMENT RIGHT FOOT;  Surgeon: Newt Minion, MD;  Location: [place];  Service: Orthopedics;  Laterality: Right;  . INCISION AND DRAINAGE FOOT Right 01/17/2016  . INGUINAL HERNIA REPAIR Bilateral ~ 1983- ~ 1986  . TONSILLECTOMY  ~ 1985        Home Medications    Prior to Admission medications   Medication Sig Start Date End Date Taking? Authorizing Provider  acetaminophen (TYLENOL) 325 MG tablet Take 2 tablets (650 mg total) by mouth every 6  (six) hours as needed for mild pain, moderate pain or fever (or Fever >/= 101). Patient not taking: Reported on 02/08/2018 08/11/17   Modena Jansky, MD  acetaminophen (TYLENOL) 500 MG tablet Take 500 mg by mouth every 6 (six) hours as needed for headache (pain).    [provider]  amLODipine (NORVASC) 5 MG tablet Take 1 tablet (5 mg total) by mouth daily. 02/02/18   Ladell Pier, MD  aspirin EC 81 MG tablet Take 1 tablet (81 mg total) by mouth daily. 09/16/16   Funches, Adriana Mccallum, MD  atorvastatin (LIPITOR) 80 MG tablet Take 1 tablet (80 mg total) by mouth every morning. Patient not taking: Reported on 02/08/2018 03/03/17   Charlott Rakes, MD  carbamazepine (TEGRETOL) 200 MG tablet TAKE 1 TABLET BY MOUTH AT BEDTIME. Patient taking differently: Take 200 mg by mouth at bedtime.  01/01/18   Aundra Dubin, MD  carvedilol (COREG) 25 MG tablet Take 1 tablet (25 mg total) by mouth  2 (two) times daily with a meal. Patient taking differently: Take 25 mg by mouth daily.  11/20/17   Charlott Rakes, MD  doxycycline (VIBRA-TABS) 100 MG tablet Take 100 mg by mouth 2 (two) times daily.    [provider]  FLUoxetine (PROZAC) 40 MG capsule Take 1 capsule (40 mg total) by mouth daily. 08/06/17   Eksir, Richard Miu, MD  folic acid (FOLVITE) 1 MG tablet Take 1 tablet (1 mg total) by mouth daily. Patient not taking: Reported on 02/08/2018 07/04/16   Asencion Partridge, MD  gabapentin (NEURONTIN) 300 MG capsule Take 1 capsule (300 mg total) by mouth 2 (two) times daily. Patient taking differently: Take 300 mg by mouth at bedtime.  11/20/17   Charlott Rakes, MD  glipiZIDE (GLUCOTROL XL) 10 MG 24 hr tablet Take 1 tablet (10 mg total) by mouth daily with breakfast. 11/20/17   Charlott Rakes, MD  Insulin Glargine (LANTUS SOLOSTAR) 100 UNIT/ML Solostar Pen Inject 35 Units into the skin 2 (two) times daily. Patient taking differently: Inject 46 Units into the skin at bedtime.  11/20/17   Charlott Rakes,  MD  lisinopril (PRINIVIL,ZESTRIL) 10 MG tablet Take 1 tablet (10 mg total) by mouth daily. 01/14/18   Steve Rattler, DO  methocarbamol (ROBAXIN) 500 MG tablet Take 1 tablet (500 mg total) by mouth every 6 (six) hours as needed for muscle spasms. 01/23/18   Rayburn, Neta Mends, PA-C  oxyCODONE (OXY IR/ROXICODONE) 5 MG immediate release tablet Take 1-2 tablets (5-10 mg total) by mouth every 4 (four) hours as needed for moderate pain (pain score 4-6). Patient not taking: Reported on 02/08/2018 01/23/18   Rayburn, Neta Mends, PA-C  pantoprazole (PROTONIX) 40 MG tablet Take 1 tablet (40 mg total) by mouth daily. 12/30/17   Charlott Rakes, MD  polyethylene glycol (MIRALAX / GLYCOLAX) packet Take 17 g by mouth daily as needed for moderate constipation. Patient taking differently: Take 17 g by mouth daily as needed for moderate constipation. Mix in 8 oz liquid and drink 07/03/16   Asencion Partridge, MD  senna-docusate (SENOKOT-S) 8.6-50 MG tablet Take 1 tablet by mouth at bedtime. Patient taking differently: Take 1 tablet by mouth at bedtime as needed for mild constipation.  07/03/16   Asencion Partridge, MD  sucralfate (CARAFATE) 1 g tablet TAKE 1 TABLET (1 G TOTAL) BY MOUTH 4 (FOUR) TIMES DAILY - WITH MEALS AND AT BEDTIME. Patient taking differently: Take 1 g by mouth 2 (two) times daily.  11/24/17   [place]sterwood, Amy S, PA-C  tamsulosin (FLOMAX) 0.4 MG CAPS capsule Take 1 capsule (0.4 mg total) by mouth daily. 01/14/18   Steve Rattler, DO  traZODone (DESYREL) 100 MG tablet Take 1 tablet (100 mg total) by mouth at bedtime. 01/13/18   Steve Rattler, DO  Vitamin D, Ergocalciferol, (DRISDOL) 50000 units CAPS capsule Take 1 capsule (50,000 Units total) by mouth See admin instructions. Take one capsule (50000 units) by mouth on the 1st Sunday of each month Patient not taking: Reported on 02/08/2018 08/11/17   Modena Jansky, MD    Family History Family History  Problem Relation Age of Onset  . Hypertension  Mother   . Diabetes Mother   . Hyperlipidemia Mother   . Heart disease Mother        s/p pacemaker  . Diabetes Father   . Hypertension Father   . Stroke Father   . Heart attack Father        first MI @ 64.  Marland Kitchen  Depression Father   . Dementia Father   . Stroke Brother   . ADD / ADHD Brother   . Anxiety disorder Brother   . Bipolar disorder Brother   . OCD Brother   . Sexual abuse Brother     Social History Social History   Tobacco Use  . Smoking status: Never Smoker  . Smokeless tobacco: Never Used  Substance Use Topics  . Alcohol use: No    Alcohol/week: 0.0 standard drinks  . Drug use: No     Allergies   Other and Lactose intolerance (gi)   Review of Systems Review of Systems  Constitutional: Positive for fever. Negative for chills.  Respiratory: Negative for shortness of breath.   Cardiovascular: Positive for leg swelling. Negative for chest pain.  Gastrointestinal: Negative for abdominal pain, nausea and vomiting.  Musculoskeletal: Positive for arthralgias and myalgias. Negative for back pain.  Skin: Positive for wound.  Neurological: Positive for numbness. Negative for weakness.  All other systems reviewed and are negative.    Physical Exam Updated Vital Signs BP (!) 140/95   Pulse 87   Temp 98.8 F (37.1 C) (Oral)   SpO2 98%   Physical Exam  Constitutional: He is oriented to person, place, and time. He appears well-developed and well-nourished.  HENT:  Head: Normocephalic and atraumatic.  Mouth/Throat: Oropharynx is clear and moist.  Eyes: Pupils are equal, round, and reactive to light. EOM are normal.  Neck: Normal range of motion. Neck supple.  Cardiovascular: Normal rate and regular rhythm. Exam reveals no gallop and no friction rub.  Pulmonary/Chest: Effort normal and breath sounds normal. No stridor. No respiratory distress. He has no wheezes. He has no rales. He exhibits no tenderness.  Abdominal: Soft. Bowel sounds are normal. There is no  tenderness. There is no rebound and no guarding.  Musculoskeletal: Normal range of motion. He exhibits no edema or tenderness.  Patient with ulceration to the base of the great toe of the right foot over the lateral plantar surface.  There is small amount of yellow drainage.  Surrounding erythema extending from the distal great toe to the midfoot.  No areas of fluctuance or crepitance.  Distal pulses intact.  No tracking present.  Neurological: He is alert and oriented to person, place, and time.  Decreased sensation over the great toe of the right foot.  Otherwise sensation intact.  5/5 motor in all extremities.  Skin: Skin is warm and dry. Capillary refill takes less than 2 seconds. No rash noted. There is erythema.  Psychiatric: He has a normal mood and affect. His behavior is normal.  Nursing note and vitals reviewed.    ED Treatments / Results  Labs (all labs ordered are listed, but only abnormal results are displayed) Labs Reviewed  CBC WITH DIFFERENTIAL/PLATELET - Abnormal; Notable for the following components:      Result Value   RBC 4.08 (*)    Hemoglobin 12.0 (*)    Neutro Abs 8.0 (*)    All other components within normal limits  BASIC METABOLIC PANEL - Abnormal; Notable for the following components:   Glucose, Bld 171 (*)    Creatinine, Ser 1.40 (*)    GFR calc non Af Amer 58 (*)    All other components within normal limits  SEDIMENTATION RATE - Abnormal; Notable for the following components:   Sed Rate 112 (*)    All other components within normal limits  C-REACTIVE PROTEIN - Abnormal; Notable for the following components:  CRP 7.3 (*)    All other components within normal limits  AEROBIC CULTURE (SUPERFICIAL SPECIMEN)    EKG None  Radiology Dg Foot Complete Right  Result Date: 02/18/2018 CLINICAL DATA:  Diabetic patient with a wound on the right great toe. EXAM: RIGHT FOOT COMPLETE - 3+ VIEW COMPARISON:  Plain films right foot 02/08/2018. MRI right foot  01/12/2018. FINDINGS: Skin wound is seen along the medial aspect of the first MTP joint. Soft tissues of the foot are swollen. No soft tissue gas or radiopaque foreign body. No bony destructive change or periosteal reaction. IMPRESSION: Skin ulceration soft tissue swelling without evidence of osteomyelitis. Electronically Signed   By: Inge Rise M.D.   On: 02/18/2018 11:14    Procedures Procedures (including critical care time)  Medications Ordered in ED Medications  vancomycin (VANCOCIN) IVPB 1000 mg/200 mL premix (has no administration in time range)  piperacillin-tazobactam (ZOSYN) IVPB 3.375 g (has no administration in time range)     Initial Impression / Assessment and Plan / ED Course  I have reviewed the triage vital signs and the nursing notes.  Pertinent labs & imaging results that were available during my care of the patient were reviewed by me and considered in my medical decision making (see chart for details).     Infected diabetic right foot ulceration.  No evidence of osteo-on x-ray.  Patient does have elevation in CRP and sed rate.  Will give broad-spectrum antibiotics and discussed with hospital team.  Final Clinical Impressions(s) / ED Diagnoses   Final diagnoses:  Cellulitis of right foot    ED Discharge Orders    None       Julianne Rice, MD 02/18/18 1413

## 2018-02-18 NOTE — H&P (Signed)
History and Physical    Shane [name] EHU:[id number] DOB: 10/01/1970 DOA: 02/18/2018  PCP: Charlott Rakes, MD Consultants:  none Patient coming from: home- lives with  Chief Complaint: Foot wound  HPI: Shane [name] is a 47 y.o. male with medical history significant for DM2, chronic diabetic foot ulcer f/b Dr. Sharol Given who is s/p I and D 01/21/2018, recently completed a month of doxycycline 100 mg BID who came to the ED today c/o an "opening" of his R great toe wound. He states he was seen in Dr. Jess Barters clinic on 8/29 and complained of cracking/opening up of over the plantar surface of the base of the first metatarsal. He was instructed to continue his doxycycline and stay off the foot and keep it elevated, as well as to wash with soap and water and use Silvadene once a day. Also to cover the area with gauze and Ace wrap and use postop shoe, all of which he states he has been doing. He reports that he lost feeling in that great toe since Sunday, and admits that he stuck a sterilized (with alcohol) needle into the open wound that day to test his sensation and he couldn't feel the needle. He states that pus has been draining from the wound for about 3 days and the area around it has become red and warm.  ED Course: No drainage currently, ESR 112, CRP elevated, WBC nL Xray no gas, no evidence of OM Baseline renal insufficiency Vanc and Zosyn given  Review of Systems: As per HPI; otherwise review of systems reviewed and negative.   Ambulatory Status: nonweightbearing  Past Medical History:  Diagnosis Date  . Anemia   . Bipolar disorder ([place])   . Cellulitis 05/2017   RIGHT FOOT   DIABETIC ULCER   . Cellulitis and abscess of left leg 06/2016  . Chest pain    a. 2015 Reportedly normal stress test in [place].  Marland Kitchen Chronic diastolic CHF (congestive heart failure) (HCC)    a.03/2015 Echo: EF 55-60%, Gr 1 DD, mild MR, triv PR.  . Depression   . Depression with anxiety   . Dyspnea    with exertion  . GERD  (gastroesophageal reflux disease)   . Glaucoma   . Hyperlipidemia   . Hypertension    a. 08/2014 Admitted with hypertensive urgency.  . Insomnia   . Internal carotid artery stenosis   . Lower GI bleed 07/04/2015  . Neuropathy   . Refusal of blood transfusions as patient is Jehovah's Witness   . TIA (transient ischemic attack) 08/2014; 03/2015   a. 08/2014 in setting of hypertensive urgency.  . Type II diabetes mellitus ([place])    started insulin spring 2016, Type II  . Vitamin D deficiency spring 2016    Past Surgical History:  Procedure Laterality Date  . AMPUTATION Left 08/03/2015   Procedure: AMPUTATION LEFT GREAT TOE;  Surgeon: Newt Minion, MD;  Location: [facility];  Service: Orthopedics;  Laterality: Left;  . AMPUTATION Left 12/02/2015   Procedure: amputation of left 2nd digit  . AMPUTATION Left 04/10/2016   Procedure: Left 2nd Toe Amputation at MTP Joint;  Surgeon: Newt Minion, MD;  Location: [facility];  Service: Orthopedics;  Laterality: Left;  . AMPUTATION Left 06/09/2016   Procedure: AMPUTATION LEFT THIRD TOE;  Surgeon: [name], MD;  Location: [facility];  Service: Orthopedics;  Laterality: Left;  . AMPUTATION Left 06/26/2016   Procedure: Left Foot Transmetatarsal Amputation;  Surgeon: Newt Minion, MD;  Location:  [facility];  Service: Orthopedics;  Laterality: Left;  . APPLICATION OF WOUND VAC Left 12/19/2015   Procedure: APPLICATION OF WOUND VAC;  Surgeon: Meredith Pel, MD;  Location: [facility];  Service: Orthopedics;  Laterality: Left;  . CIRCUMCISION    . COLONOSCOPY N/A 01/22/2016   Procedure: COLONOSCOPY;  Surgeon: Gatha Mayer, MD;  Location: [facility];  Service: Endoscopy;  Laterality: N/A;  . ESOPHAGOGASTRODUODENOSCOPY N/A 01/19/2016   Procedure: ESOPHAGOGASTRODUODENOSCOPY (EGD);  Surgeon: Manus Gunning, MD;  Location: [facility];  Service: Gastroenterology;  Laterality: N/A;  . ESOPHAGOGASTRODUODENOSCOPY N/A 01/22/2016   Procedure: ESOPHAGOGASTRODUODENOSCOPY (EGD);   Surgeon: Gatha Mayer, MD;  Location: [facility] ENDOSCOPY;  Service: Endoscopy;  Laterality: N/A;  . I&D EXTREMITY Left 12/02/2015   Procedure: IRRIGATION AND DEBRIDEMENT OF FOOT; LEFT SECOND TOE AMPUTATION;  Surgeon: Meredith Pel, MD;  Location: [facility];  Service: Orthopedics;  Laterality: Left;  . I&D EXTREMITY Left 12/19/2015   Procedure: I & D LEFT FOOT WITH BEADS ;  Surgeon: Meredith Pel, MD;  Location: [facility];  Service: Orthopedics;  Laterality: Left;  . I&D EXTREMITY Right 01/17/2016   Procedure: IRRIGATION AND DEBRIDEMENT RIGHT FOOT;  Surgeon: Newt Minion, MD;  Location: [facility];  Service: Orthopedics;  Laterality: Right;  . I&D EXTREMITY Right 01/21/2018   Procedure: IRRIGATION AND DEBRIDEMENT RIGHT FOOT;  Surgeon: Newt Minion, MD;  Location: [facility];  Service: Orthopedics;  Laterality: Right;  . INCISION AND DRAINAGE FOOT Right 01/17/2016  . INGUINAL HERNIA REPAIR Bilateral ~ 1983- ~ 1986  . TONSILLECTOMY  ~ 82    Social History   Socioeconomic History  . Marital status: Married    Spouse name: Not on file  . Number of children: 1  . Years of education: Not on file  . Highest education level: Not on file  Occupational History  . Occupation: disabled  Social Needs  . Financial resource strain: Not on file  . Food insecurity:    Worry: Never true    Inability: Never true  . Transportation needs:    Medical: No    Non-medical: No  Tobacco Use  . Smoking status: Never Smoker  . Smokeless tobacco: Never Used  Substance and Sexual Activity  . Alcohol use: No    Alcohol/week: 0.0 standard drinks  . Drug use: No  . Sexual activity: Not Currently  Lifestyle  . Physical activity:    Days per week: Not on file    Minutes per session: Not on file  . Stress: Not on file  Relationships  . Social connections:    Talks on phone: Not on file    Gets together: Not on file    Attends religious service: Not on file    Active member of club or organization: Not on file     Attends meetings of clubs or organizations: Not on file    Relationship status: Not on file  . Intimate partner violence:    Fear of current or ex partner: Not on file    Emotionally abused: Not on file    Physically abused: Not on file    Forced sexual activity: Not on file  Other Topics Concern  . Not on file  Social History Narrative   Lives in [place] with wife.  Active but doesn't routinely exercise.    Allergies  Allergen Reactions  . Other Diarrhea and Other (See Comments)    Red meat causes stomach pains, bloating and diarrhea  . Lactose  Intolerance (Gi) Diarrhea and Other (See Comments)    Bloating     Family History  Problem Relation Age of Onset  . Hypertension Mother   . Diabetes Mother   . Hyperlipidemia Mother   . Heart disease Mother        s/p pacemaker  . Diabetes Father   . Hypertension Father   . Stroke Father   . Heart attack Father        first MI @ 24.  . Depression Father   . Dementia Father   . Stroke Brother   . ADD / ADHD Brother   . Anxiety disorder Brother   . Bipolar disorder Brother   . OCD Brother   . Sexual abuse Brother     Prior to Admission medications   Medication Sig Start Date End Date Taking? Authorizing Provider  acetaminophen (TYLENOL) 325 MG tablet Take 2 tablets (650 mg total) by mouth every 6 (six) hours as needed for mild pain, moderate pain or fever (or Fever >/= 101). Patient not taking: Reported on 02/08/2018 08/11/17   Modena Jansky, MD  acetaminophen (TYLENOL) 500 MG tablet Take 500 mg by mouth every 6 (six) hours as needed for headache (pain).    [provider]  amLODipine (NORVASC) 5 MG tablet Take 1 tablet (5 mg total) by mouth daily. 02/02/18   Ladell Pier, MD  aspirin EC 81 MG tablet Take 1 tablet (81 mg total) by mouth daily. 09/16/16   Funches, Adriana Mccallum, MD  atorvastatin (LIPITOR) 80 MG tablet Take 1 tablet (80 mg total) by mouth every morning. Patient not taking: Reported on 02/08/2018 03/03/17    Charlott Rakes, MD  carbamazepine (TEGRETOL) 200 MG tablet TAKE 1 TABLET BY MOUTH AT BEDTIME. Patient taking differently: Take 200 mg by mouth at bedtime.  01/01/18   Eksir, Richard Miu, MD  carvedilol (COREG) 25 MG tablet Take 1 tablet (25 mg total) by mouth 2 (two) times daily with a meal. Patient taking differently: Take 25 mg by mouth daily.  11/20/17   Charlott Rakes, MD  doxycycline (VIBRA-TABS) 100 MG tablet Take 100 mg by mouth 2 (two) times daily.    [provider]  FLUoxetine (PROZAC) 40 MG capsule Take 1 capsule (40 mg total) by mouth daily. 08/06/17   Eksir, Richard Miu, MD  folic acid (FOLVITE) 1 MG tablet Take 1 tablet (1 mg total) by mouth daily. Patient not taking: Reported on 02/08/2018 07/04/16   Asencion Partridge, MD  gabapentin (NEURONTIN) 300 MG capsule Take 1 capsule (300 mg total) by mouth 2 (two) times daily. Patient taking differently: Take 300 mg by mouth at bedtime.  11/20/17   Charlott Rakes, MD  glipiZIDE (GLUCOTROL XL) 10 MG 24 hr tablet Take 1 tablet (10 mg total) by mouth daily with breakfast. 11/20/17   Charlott Rakes, MD  Insulin Glargine (LANTUS SOLOSTAR) 100 UNIT/ML Solostar Pen Inject 35 Units into the skin 2 (two) times daily. Patient taking differently: Inject 46 Units into the skin at bedtime.  11/20/17   Charlott Rakes, MD  lisinopril (PRINIVIL,ZESTRIL) 10 MG tablet Take 1 tablet (10 mg total) by mouth daily. 01/14/18   Steve Rattler, DO  methocarbamol (ROBAXIN) 500 MG tablet Take 1 tablet (500 mg total) by mouth every 6 (six) hours as needed for muscle spasms. 01/23/18   Rayburn, Neta Mends, PA-C  oxyCODONE (OXY IR/ROXICODONE) 5 MG immediate release tablet Take 1-2 tablets (5-10 mg total) by mouth every 4 (four) hours as needed  for moderate pain (pain score 4-6). Patient not taking: Reported on 02/08/2018 01/23/18   Rayburn, Neta Mends, PA-C  pantoprazole (PROTONIX) 40 MG tablet Take 1 tablet (40 mg total) by mouth daily. 12/30/17   Charlott Rakes, MD  polyethylene glycol (MIRALAX / GLYCOLAX) packet Take 17 g by mouth daily as needed for moderate constipation. Patient taking differently: Take 17 g by mouth daily as needed for moderate constipation. Mix in 8 oz liquid and drink 07/03/16   Asencion Partridge, MD  senna-docusate (SENOKOT-S) 8.6-50 MG tablet Take 1 tablet by mouth at bedtime. Patient taking differently: Take 1 tablet by mouth at bedtime as needed for mild constipation.  07/03/16   Asencion Partridge, MD  sucralfate (CARAFATE) 1 g tablet TAKE 1 TABLET (1 G TOTAL) BY MOUTH 4 (FOUR) TIMES DAILY - WITH MEALS AND AT BEDTIME. Patient taking differently: Take 1 g by mouth 2 (two) times daily.  11/24/17   Esterwood, Amy S, PA-C  tamsulosin (FLOMAX) 0.4 MG CAPS capsule Take 1 capsule (0.4 mg total) by mouth daily. 01/14/18   Steve Rattler, DO  traZODone (DESYREL) 100 MG tablet Take 1 tablet (100 mg total) by mouth at bedtime. 01/13/18   Steve Rattler, DO  Vitamin D, Ergocalciferol, (DRISDOL) 50000 units CAPS capsule Take 1 capsule (50,000 Units total) by mouth See admin instructions. Take one capsule (50000 units) by mouth on the 1st Sunday of each month Patient not taking: Reported on 02/08/2018 08/11/17   Modena Jansky, MD    Physical Exam: Vitals:   02/18/18 0933  BP: (!) 140/95  Pulse: 87  Temp: 98.8 F (37.1 C)  TempSrc: Oral  SpO2: 98%     . General:  Appears calm and comfortable and is in NAD . Eyes:  PERRL, EOMI, normal lids, iris . ENT:  grossly normal hearing, lips & tongue, mmm; appropriate dentition . Neck:  no LAD, masses or thyromegaly; no carotid bruits . Cardiovascular:  normal rate, reg rhythm, no murmur. Marland Kitchen Respiratory:   CTA bilaterally with no wheezes/rales/rhonchi.  Normal respiratory effort. . Abdomen:  soft, NT, ND, NABS . Back:   grossly normal alignment, no CVAT . Skin:  no rash or induration seen on limited exam . Musculoskeletal: 1.5 cm ulceration at the base of the great toe over the medial  plantar surface on the right foot with no visible drainage.  There is surrounding erythema and warmth from the distal great toe to the midfoot. (See below photos) He has no sensation from the base of his great toe and distal.  Motor function intact.  Grossly normal tone BUE/BLE, good ROM, no bony abnormality or obvious joint deformity  . Lower extremity:  No LE edema. 2+ distal pulses. Marland Kitchen Psychiatric:  grossly normal mood and affect, speech fluent and appropriate, AOx3 . Neurologic:  CN 2-12 grossly intact, moves all extremities in coordinated fashion, sensation intact          Radiological Exams on Admission: Dg Foot Complete Right  Result Date: 02/18/2018 CLINICAL DATA:  Diabetic patient with a wound on the right great toe. EXAM: RIGHT FOOT COMPLETE - 3+ VIEW COMPARISON:  Plain films right foot 02/08/2018. MRI right foot 01/12/2018. FINDINGS: Skin wound is seen along the medial aspect of the first MTP joint. Soft tissues of the foot are swollen. No soft tissue gas or radiopaque foreign body. No bony destructive change or periosteal reaction. IMPRESSION: Skin ulceration soft tissue swelling without evidence of osteomyelitis. Electronically Signed   By: Marcello Moores  Dalessio M.D.   On: 02/18/2018 11:14    EKG: N/A   Labs on Admission: I have personally reviewed the available labs and imaging studies at the time of the admission.  Pertinent labs:  Na 138 K 4.4 Cl 101 CO2 30 Gluc 171 BUN 15 Creat 1.4 (baseline)  CRP 7.3 ESR 112  WBC 9.8 Hgb 12 (above baseline) Plt 300   Assessment/Plan Principal Problem:   Cellulitis of foot, right Active Problems:   HLD (hyperlipidemia)   Essential hypertension   GERD (gastroesophageal reflux disease)   Depression with anxiety   Diabetes mellitus type 2, uncontrolled, with complications (HCC)   Chronic diastolic CHF (congestive heart failure), NYHA class 1 (HCC)   CKD (chronic kidney disease) stage 3, GFR 30-59 ml/min (HCC)   PAD  (peripheral artery disease) (HCC)   R foot cellulitis in DM foot wound -cannot r/o OM entirely but plain films showed no gas or overt OM -spoke with Dr. Sharol Given over the phone; recommended cont IV abx for 2-3 days and then d/c on doxycycline -cont vanc and zosyn for now -consider ID input -f/u BCx x 2 -wound care consult  DM2 -cont home lantus 46 units BID -hold oral hypoglycemics -SSI -carb controlled diet -neurontin home dose for neuropathy  HTN -cont home meds: lisinopril, norvasc, coreg  HLD  -cont lipitor  CKD III at baseline -daily BMP -renally dose meds  Depression/anxiety -cont tegretol, trazodone, prozac  DVT prophylaxis:  Lovenox Code Status:  Full - confirmed with patient/family Family Communication: none  Disposition Plan:  Home once clinically improved Consults called: Dr. Sharol Given, by phone  Admission status: Admit - It is my clinical opinion that admission to INPATIENT is reasonable and necessary because of the expectation that this patient will require hospital care that crosses at least 2 midnights to treat this condition based on the medical complexity of the problems presented.  Given the aforementioned information, the predictability of an adverse outcome is felt to be significant.     Janora Norlander MD [facility]  If note is complete, please contact covering daytime or nighttime physician. [email or web address] Password [email or web address]  02/18/2018, 2:07 PM

## 2018-02-18 NOTE — Progress Notes (Signed)
Pharmacy Antibiotic Note  Shane [name] is a 47 y.o. male admitted on 02/18/2018 with R foot cellulitis in DM foot wound.  Pharmacy has been consulted for Vancomycin and Zosyn dosing for wound infection. MD noted that Dr. Sharol Given recommended to cont IV abx for 2-3 days and then d/c on doxycycline.  Patient received Zosyn 3.375 g IV x1 in ED today.   Goal Vancomycin trough = 10-15 mcg/ml  Plan: Vancomycin 1500 mg IV x1 now then 1000 mg IV q12h  Zosyn 3.375 g IV q8h 4 hr infusion Monitor clinical status, renal function, culture results daily and check steady state vancomycin trough.   Height: 5\' 7"  (170.2 cm) Weight: 197 lb 12 oz (89.7 kg) IBW/kg (Calculated) : 66.1  Temp (24hrs), Avg:98.7 F (37.1 C), Min:98.5 F (36.9 C), Max:98.8 F (37.1 C)  Recent Labs  Lab 02/18/18 0939  WBC 9.8  CREATININE 1.40*    Estimated Creatinine Clearance: 68.9 mL/min (A) (by C-G formula based on SCr of 1.4 mg/dL (H)).    Allergies  Allergen Reactions  . Other Diarrhea and Other (See Comments)    Red meat causes stomach pains, bloating and diarrhea  . Lactose Intolerance (Gi) Diarrhea and Other (See Comments)    Bloating     Antimicrobials this admission: Zosyn 9/4>> Vanc 9/4>>  Dose adjustments this admission:   Microbiology results: 9/4 BCx: pending 9/4 Wound Cx R. Foot: mod GPC pending  Thank you for allowing pharmacy to be a part of this patient's care.  Nicole Cella, RPh Clinical Pharmacist Pager: [phone] Please check AMION for all [place] phone numbers After 10:00 PM, call [place] [phone] 02/18/2018 8:56 PM

## 2018-02-18 NOTE — ED Triage Notes (Signed)
Has wounds  On rt foot has had surgery on them but they have opened up again,  Saw dr Sharol Given las t week but has not called them about this new openeing

## 2018-02-18 NOTE — Plan of Care (Signed)
  Problem: Nutrition: Goal: Adequate nutrition will be maintained Outcome: Progressing   Problem: Coping: Goal: Level of anxiety will decrease Outcome: Progressing   Problem: Elimination: Goal: Will not experience complications related to bowel motility Outcome: Progressing   Problem: Pain Managment: Goal: General experience of comfort will improve Outcome: Progressing   Problem: Safety: Goal: Ability to remain free from injury will improve Outcome: Progressing   Problem: Skin Integrity: Goal: Risk for impaired skin integrity will decrease Outcome: Progressing   

## 2018-02-18 NOTE — Progress Notes (Signed)
CSW acknowledges consult for accessing medications for discharge. Please consult RNCM for further assistance with this matter.   At this time no further CSW needs warranted. CSW signing off.   Virgie Dad Deatrice Spanbauer, MSW, [facility] Clinical Social Worker [phone]

## 2018-02-19 DIAGNOSIS — L03115 Cellulitis of right lower limb: Secondary | ICD-10-CM

## 2018-02-19 LAB — CBC
HCT: 34 % — ABNORMAL LOW (ref 39.0–52.0)
Hemoglobin: 10.9 g/dL — ABNORMAL LOW (ref 13.0–17.0)
MCH: 30 pg (ref 26.0–34.0)
MCHC: 32.1 g/dL (ref 30.0–36.0)
MCV: 93.7 fL (ref 78.0–100.0)
Platelets: 282 10*3/uL (ref 150–400)
RBC: 3.63 MIL/uL — ABNORMAL LOW (ref 4.22–5.81)
RDW: 12.3 % (ref 11.5–15.5)
WBC: 9.2 10*3/uL (ref 4.0–10.5)

## 2018-02-19 LAB — BASIC METABOLIC PANEL
Anion gap: 8 (ref 5–15)
BUN: 17 mg/dL (ref 6–20)
CO2: 28 mmol/L (ref 22–32)
Calcium: 8.6 mg/dL — ABNORMAL LOW (ref 8.9–10.3)
Chloride: 104 mmol/L (ref 98–111)
Creatinine, Ser: 1.43 mg/dL — ABNORMAL HIGH (ref 0.61–1.24)
GFR calc Af Amer: >60 mL/min (ref >60)
GFR calc non Af Amer: 57 mL/min — ABNORMAL LOW (ref >60)
Glucose, Bld: 157 mg/dL — ABNORMAL HIGH (ref 70–99)
Potassium: 4.2 mmol/L (ref 3.5–5.1)
Sodium: 140 mmol/L (ref 135–145)

## 2018-02-19 LAB — GLUCOSE, CAPILLARY
Glucose-Capillary: 151 mg/dL — ABNORMAL HIGH (ref 70–99)
Glucose-Capillary: 170 mg/dL — ABNORMAL HIGH (ref 70–99)
Glucose-Capillary: 140 mg/dL — ABNORMAL HIGH (ref 70–99)
Glucose-Capillary: 195 mg/dL — ABNORMAL HIGH (ref 70–99)

## 2018-02-19 MED ORDER — PRO-STAT SUGAR FREE PO LIQD
30.0000 mL | Freq: Two times a day (BID) | ORAL | Status: DC
  Administered 2018-02-20: 30 mL via ORAL
  Filled 2018-02-19 (×4): qty 30

## 2018-02-19 NOTE — Progress Notes (Signed)
Wound care done per order, Pt denied pain during dressing change but complained of soreness. Open wound on R great toe has scant drainage, wound bed red, edges pale.

## 2018-02-19 NOTE — Progress Notes (Signed)
Initial Nutrition Assessment  DOCUMENTATION CODES:   Obesity unspecified  INTERVENTION:    Prostat liquid protein po 30 ml BID with meals, each supplement provides 100 kcal, 15 grams protein  NUTRITION DIAGNOSIS:   Increased nutrient needs related to wound healing as evidenced by estimated needs  GOAL:   Patient will meet greater than or equal to 90% of their needs  MONITOR:   PO intake, Supplement acceptance, Labs, Weight trends, Skin, I & O's  REASON FOR ASSESSMENT:   Consult Wound healing  ASSESSMENT:   47 y.o. Male with medical history significant for DM2, chronic diabetic foot ulcer f/b Dr. Sharol Given who is s/p I and D 01/21/2018, recently completed a month of doxycycline 100 mg BID who came to the ED today c/o an "opening" of his R great toe wound.  RD spoke with patient at bedside. On his cell phone. Reports he's eating well. Had a chicken sandwich for lunch today. Denies significant weight loss recently. UBW is 199 lbs.  Discussed increasing his protein intake. Amenable to Prostat supplements. Medications include folvite and lantus. Labs reviewed. CBG's [id number].  NUTRITION - FOCUSED PHYSICAL EXAM:  Complted. No muscle or fat depletions noticed.  Diet Order:   Diet Order            Diet heart healthy/carb modified Room service appropriate? Yes; Fluid consistency: Thin  Diet effective now             EDUCATION NEEDS:   Education needs have been addressed  Skin:  Skin Assessment: Skin Integrity Issues: Skin Integrity Issues:: Other (Comment) Other: R great toe wound  Last BM:  9/4  Height:   Ht Readings from Last 1 Encounters:  02/18/18 5\' 7"  (1.702 m)   Weight:   Wt Readings from Last 1 Encounters:  02/18/18 89.7 kg   BMI:  Body mass index is 30.97 kg/m.  Estimated Nutritional Needs:   Kcal:  2200-2400  Protein:  110-125 gm  Fluid:  2.2-2.4 L  Arthur Holms, RD, LDN Pager #: [phone] After-Hours Pager #: [phone]

## 2018-02-19 NOTE — Plan of Care (Signed)

## 2018-02-19 NOTE — Consult Note (Signed)
[facility] Nurse wound consult note Reason for Consult: Trauma wound to right great toe.  Plantar surface.  Duration 1 week.  X ray right foot negative for osteomyelitis.  Patient is Shane [name] with history of left transmetatarsal amputation. Diabetic neuropathy and states he has no sensation in his right great toe.  Has sensation in other toes.  Wound type:trauma Pressure Injury POA: NA Measurement: 0.2 cm x 1 cm x 0.2 cm abrasion Wound UYE:BXIDH red  Drainage (amount, consistency, odor) moderate serosanguinous drainage  No odor Periwound:intact  Diminished protective sensation to this area Dressing procedure/placement/frequency:Cleanse right toe with soap and water and pat dry. Wrap toe with Aquacel Ag strip.  Cover with gauze and tape.  Change daily.  Will not follow at this time.  Please re-consult if needed.  Domenic Moras RN BSN [place] Pager [phone]

## 2018-02-19 NOTE — Social Work (Signed)
CSW acknowledging consult for "access meds at discharge." For medication access please consult RN Case Manager.   CSW signing off. Please consult if any additional needs arise.  [place], [place] Work [phone]

## 2018-02-19 NOTE — Progress Notes (Addendum)
PROGRESS NOTE  Shane [name] SWF:[id number] DOB: [birth year] DOA: 02/18/2018 PCP: Charlott Rakes, MD  HPI/Recap of past 24 hours:  C/o right big toe has been numb for a week Open wound to right bid toe start to drain and bleed No fever, he reports chills  Assessment/Plan: Principal Problem:   Cellulitis of foot, right Active Problems:   HLD (hyperlipidemia)   Essential hypertension   GERD (gastroesophageal reflux disease)   Depression with anxiety   Diabetes mellitus type 2, uncontrolled, with complications (HCC)   Chronic diastolic CHF (congestive heart failure), NYHA class 1 (HCC)   CKD (chronic kidney disease) stage 3, GFR 30-59 ml/min (HCC)   PAD (peripheral artery disease) ([place])   R foot cellulitis in DM foot wound -patient reports he has been on doxycycline for a month at home,  -wound is open and draining, case discussed with Dr Sharol Given who recommended foot mri -superficial wound culture in process, blood culture no growth -continue vanc/zosyn , continue wound care  Insulin dependent dm2, with peripheral neuropathy Will get a1c, continue insulin, adjust prn Continue neurontin  HTN -cont home meds: lisinopril, norvasc, coreg  HLD  -cont lipitor  CKD III at baseline -daily BMP -renally dose meds  Depression/anxiety -cont tegretol, trazodone, prozac  Baseline wheelchair bound  Code Status: full  Family Communication: patient   Disposition Plan: not ready to discharge   Consultants:  Orthopedics   Procedures:  none  Antibiotics:  vanc/zosyn   Objective: BP (!) 159/95 (BP Location: Right Arm)   Pulse 100   Temp 98.1 F (36.7 C) (Oral)   Resp 17   Ht 5\' 7"  (1.702 m)   Wt 89.7 kg   SpO2 97%   BMI 30.97 kg/m   Intake/Output Summary (Last 24 hours) at 02/19/2018 1320 Last data filed at 02/19/2018 1302 Gross per 24 hour  Intake 3396.69 ml  Output 1750 ml  Net 1646.69 ml   Filed Weights   02/18/18 1730  Weight: 89.7 kg     Exam: Patient is examined daily including today on 02/19/2018, exams remain the same as of yesterday except that has changed    General:  NAD  Cardiovascular: RRR  Respiratory: CTABL  Abdomen: Soft/ND/NT, positive BS  Musculoskeletal: right foot open wound, right bid toe appear swollen (see pic below , pic taken on 9/5), he has decreased sensation , he does not feel pain in right toe. Left foot partial amputation  Neuro: alert, oriented         Data Reviewed: Basic Metabolic Panel: Recent Labs  Lab 02/18/18 0939 02/19/18 0422  NA 138 140  K 4.4 4.2  CL 101 104  CO2 30 28  GLUCOSE 171* 157*  BUN 15 17  CREATININE 1.40* 1.43*  CALCIUM 9.3 8.6*   Liver Function Tests: No results for input(s): AST, ALT, ALKPHOS, BILITOT, PROT, ALBUMIN in the last 168 hours. No results for input(s): LIPASE, AMYLASE in the last 168 hours. No results for input(s): AMMONIA in the last 168 hours. CBC: Recent Labs  Lab 02/18/18 0939 02/19/18 0422  WBC 9.8 9.2  NEUTROABS 8.0*  --   HGB 12.0* 10.9*  HCT 39.2 34.0*  MCV 96.1 93.7  PLT 300 282   Cardiac Enzymes:   No results for input(s): CKTOTAL, CKMB, CKMBINDEX, TROPONINI in the last 168 hours. BNP (last 3 results) No results for input(s): BNP in the last 8760 hours.  ProBNP (last 3 results) No results for input(s): PROBNP in the last 8760 hours.  CBG: Recent Labs  Lab 02/18/18 1733 02/18/18 2157 02/19/18 0722 02/19/18 1130  GLUCAP 89 133* 151* 170*    Recent Results (from the past 240 hour(s))  Wound or Superficial Culture     Status: None (Preliminary result)   Collection Time: 02/18/18 11:48 AM  Result Value Ref Range Status   Specimen Description WOUND RIGHT FOOT  Final   Special Requests NONE  Final   Gram Stain   Final    NO SQUAMOUS EPITHELIAL CELLS SEEN NO WBC SEEN MODERATE GRAM POSITIVE COCCI    Culture   Final    MODERATE STAPHYLOCOCCUS AUREUS CULTURE REINCUBATED FOR BETTER GROWTH Performed at  [facility], [place] [address] [place], [place] [address]    Report Status PENDING  Incomplete  Culture, blood (routine x 2)     Status: None (Preliminary result)   Collection Time: 02/18/18  4:24 PM  Result Value Ref Range Status   Specimen Description BLOOD RIGHT HAND  Final   Special Requests   Final    BOTTLES DRAWN AEROBIC AND ANAEROBIC Blood Culture results may not be optimal due to an excessive volume of blood received in culture bottles   Culture  Setup Time PENDING  Incomplete   Culture   Final    NO GROWTH < 24 HOURS Performed at [facility], [place] [address] [place], [place] [address]    Report Status PENDING  Incomplete  Culture, blood (routine x 2)     Status: None (Preliminary result)   Collection Time: 02/18/18  4:24 PM  Result Value Ref Range Status   Specimen Description BLOOD LEFT ANTECUBITAL  Final   Special Requests   Final    BOTTLES DRAWN AEROBIC AND ANAEROBIC Blood Culture results may not be optimal due to an excessive volume of blood received in culture bottles   Culture   Final    NO GROWTH < 24 HOURS Performed at [facility], [place] [address] [place], [place] [address]    Report Status PENDING  Incomplete     Studies: No results found.  Scheduled Meds: . amLODipine  5 mg Oral Daily  . aspirin EC  81 mg Oral Daily  . atorvastatin  80 mg Oral q morning - 10a  . carbamazepine  200 mg Oral QHS  . carvedilol  25 mg Oral Daily  . enoxaparin (LOVENOX) injection  40 mg Subcutaneous Q24H  . FLUoxetine  40 mg Oral Daily  . folic acid  1 mg Oral Daily  . gabapentin  300 mg Oral QHS  . insulin aspart  0-5 Units Subcutaneous QHS  . insulin aspart  0-9 Units Subcutaneous TID WC  . insulin glargine  46 Units Subcutaneous QHS  . lisinopril  10 mg Oral Daily  . pantoprazole  40 mg Oral Daily  . sucralfate  1 g Oral BID  . tamsulosin  0.4 mg Oral Daily  . traZODone  100 mg Oral QHS    Continuous Infusions: . sodium chloride 75 mL/hr at  02/19/18 1302  . piperacillin-tazobactam (ZOSYN)  IV Stopped (02/19/18 1029)  . vancomycin Stopped (02/19/18 1011)     Time spent: 91mins, case discussed with Dr Sharol Given I have personally reviewed and interpreted on  02/19/2018 daily labs, imagings as discussed above under date review session and assessment and plans.  I reviewed all nursing notes, pharmacy notes, consultant notes,  vitals, pertinent old records  I have discussed plan of care as described above with RN ,  patient  on 02/19/2018   Florencia Reasons MD, PhD  [facility] Pager [phone]. If 7PM-7AM, please contact night-coverage at [email or web address], password [facility] 02/19/2018, 1:20 PM  LOS: 1 day

## 2018-02-20 LAB — BASIC METABOLIC PANEL
ANION GAP: 6 (ref 5–15)
BUN: 16 mg/dL (ref 6–20)
CHLORIDE: 105 mmol/L (ref 98–111)
CO2: 30 mmol/L (ref 22–32)
Calcium: 9 mg/dL (ref 8.9–10.3)
Creatinine, Ser: 1.57 mg/dL — ABNORMAL HIGH (ref 0.61–1.24)
GFR calc Af Amer: 59 mL/min — ABNORMAL LOW (ref >60)
GFR calc non Af Amer: 51 mL/min — ABNORMAL LOW (ref >60)
GLUCOSE: 132 mg/dL — AB (ref 70–99)
POTASSIUM: 4.6 mmol/L (ref 3.5–5.1)
Sodium: 141 mmol/L (ref 135–145)

## 2018-02-20 LAB — HEMOGLOBIN A1C
HEMOGLOBIN A1C: 7.3 % — AB (ref 4.8–5.6)
Mean Plasma Glucose: 162.81 mg/dL

## 2018-02-20 LAB — GLUCOSE, CAPILLARY
Glucose-Capillary: 109 mg/dL — ABNORMAL HIGH (ref 70–99)
Glucose-Capillary: 215 mg/dL — ABNORMAL HIGH (ref 70–99)
Glucose-Capillary: 105 mg/dL — ABNORMAL HIGH (ref 70–99)
Glucose-Capillary: 234 mg/dL — ABNORMAL HIGH (ref 70–99)

## 2018-02-20 LAB — CBC
HCT: 35.4 % — ABNORMAL LOW (ref 39.0–52.0)
HEMOGLOBIN: 10.9 g/dL — AB (ref 13.0–17.0)
MCH: 29.3 pg (ref 26.0–34.0)
MCHC: 30.8 g/dL (ref 30.0–36.0)
MCV: 95.2 fL (ref 78.0–100.0)
Platelets: 307 10*3/uL (ref 150–400)
RBC: 3.72 MIL/uL — AB (ref 4.22–5.81)
RDW: 12.1 % (ref 11.5–15.5)
WBC: 7.1 10*3/uL (ref 4.0–10.5)

## 2018-02-20 NOTE — Plan of Care (Signed)

## 2018-02-20 NOTE — Plan of Care (Signed)
  Problem: Coping: Goal: Level of anxiety will decrease Outcome: Progressing   Problem: Pain Managment: Goal: General experience of comfort will improve Outcome: Progressing   Problem: Safety: Goal: Ability to remain free from injury will improve Outcome: Progressing   

## 2018-02-20 NOTE — Progress Notes (Signed)
PROGRESS NOTE  Shane [name] SAY:[id number] DOB: [birth year] DOA: 02/18/2018 PCP: Charlott Rakes, MD  HPI/Recap of past 24 hours:  C/o right big toe has been numb for a week Open wound to right bid toe continue to have bloody drainage No fever, he reports chills  Awaiting for mri  Assessment/Plan: Principal Problem:   Cellulitis of foot, right Active Problems:   HLD (hyperlipidemia)   Essential hypertension   GERD (gastroesophageal reflux disease)   Depression with anxiety   Diabetes mellitus type 2, uncontrolled, with complications (HCC)   Chronic diastolic CHF (congestive heart failure), NYHA class 1 (HCC)   CKD (chronic kidney disease) stage 3, GFR 30-59 ml/min (HCC)   PAD (peripheral artery disease) (HCC)   R foot cellulitis in DM foot wound -patient reports he has been on doxycycline for a month at home,  --superficial wound culture + staph aureus, blood culture no growth -Esr/crp elevated -continue vanc/zosyn , continue wound care -wound is open and draining, case discussed with Dr Sharol Given who recommended foot mri  Insulin dependent dm2, with peripheral neuropathy a1c 7.3, continue insulin, adjust prn Continue neurontin  HTN -cont home meds: lisinopril, norvasc, coreg  HLD  -cont lipitor  CKD III at baseline -daily BMP -renally dose meds  Depression/anxiety -cont tegretol, trazodone, prozac  Baseline wheelchair bound  Code Status: full  Family Communication: patient   Disposition Plan: not ready to discharge   Consultants:  Orthopedics   Procedures:  none  Antibiotics:  vanc/zosyn   Objective: BP (!) 183/105 (BP Location: Left Arm)   Pulse 91   Temp 98.1 F (36.7 C) (Oral)   Resp 17   Ht 5' 7"  (1.702 m)   Wt 89.7 kg   SpO2 97%   BMI 30.97 kg/m   Intake/Output Summary (Last 24 hours) at 02/20/2018 1252 Last data filed at 02/20/2018 0900 Gross per 24 hour  Intake 1868.39 ml  Output 1800 ml  Net 68.39 ml   Filed Weights   02/18/18 1730  Weight: 89.7 kg    Exam: Patient is examined daily including today on 02/20/2018, exams remain the same as of yesterday except that has changed    General:  NAD  Cardiovascular: RRR  Respiratory: CTABL  Abdomen: Soft/ND/NT, positive BS  Musculoskeletal: right foot open wound, right bid toe appear swollen (see pic below , pic taken on 9/5), he has decreased sensation , he does not feel pain in right toe. Left foot partial amputation  Neuro: alert, oriented         Data Reviewed: Basic Metabolic Panel: Recent Labs  Lab 02/18/18 0939 02/19/18 0422 02/20/18 0456  NA 138 140 141  K 4.4 4.2 4.6  CL 101 104 105  CO2 30 28 30   GLUCOSE 171* 157* 132*  BUN 15 17 16   CREATININE 1.40* 1.43* 1.57*  CALCIUM 9.3 8.6* 9.0   Liver Function Tests: No results for input(s): AST, ALT, ALKPHOS, BILITOT, PROT, ALBUMIN in the last 168 hours. No results for input(s): LIPASE, AMYLASE in the last 168 hours. No results for input(s): AMMONIA in the last 168 hours. CBC: Recent Labs  Lab 02/18/18 0939 02/19/18 0422 02/20/18 0456  WBC 9.8 9.2 7.1  NEUTROABS 8.0*  --   --   HGB 12.0* 10.9* 10.9*  HCT 39.2 34.0* 35.4*  MCV 96.1 93.7 95.2  PLT 300 282 307   Cardiac Enzymes:   No results for input(s): CKTOTAL, CKMB, CKMBINDEX, TROPONINI in the last 168 hours. BNP (last 3 results) No  results for input(s): BNP in the last 8760 hours.  ProBNP (last 3 results) No results for input(s): PROBNP in the last 8760 hours.  CBG: Recent Labs  Lab 02/19/18 1130 02/19/18 1615 02/19/18 2142 02/20/18 0720 02/20/18 1137  GLUCAP 170* 140* 195* 109* 215*    Recent Results (from the past 240 hour(s))  Wound or Superficial Culture     Status: None (Preliminary result)   Collection Time: 02/18/18 11:48 AM  Result Value Ref Range Status   Specimen Description WOUND RIGHT FOOT  Final   Special Requests NONE  Final   Gram Stain   Final    NO SQUAMOUS EPITHELIAL CELLS SEEN NO WBC  SEEN MODERATE GRAM POSITIVE COCCI    Culture   Final    MODERATE STAPHYLOCOCCUS AUREUS CULTURE REINCUBATED FOR BETTER GROWTH Performed at [facility], [place] [address], [place], [place] [address]    Report Status PENDING  Incomplete  Culture, blood (routine x 2)     Status: None (Preliminary result)   Collection Time: 02/18/18  4:24 PM  Result Value Ref Range Status   Specimen Description BLOOD RIGHT HAND  Final   Special Requests   Final    BOTTLES DRAWN AEROBIC AND ANAEROBIC Blood Culture results may not be optimal due to an excessive volume of blood received in culture bottles   Culture  Setup Time PENDING  Incomplete   Culture   Final    NO GROWTH 2 DAYS Performed at [facility], [place] [address], [place], [place] [address]    Report Status PENDING  Incomplete  Culture, blood (routine x 2)     Status: None (Preliminary result)   Collection Time: 02/18/18  4:24 PM  Result Value Ref Range Status   Specimen Description BLOOD LEFT ANTECUBITAL  Final   Special Requests   Final    BOTTLES DRAWN AEROBIC AND ANAEROBIC Blood Culture results may not be optimal due to an excessive volume of blood received in culture bottles   Culture   Final    NO GROWTH < 24 HOURS Performed at [facility], [place] [address] [place], [place] [address]    Report Status PENDING  Incomplete     Studies: No results found.  Scheduled Meds: . amLODipine  5 mg Oral Daily  . aspirin EC  81 mg Oral Daily  . atorvastatin  80 mg Oral q morning - 10a  . carbamazepine  200 mg Oral QHS  . carvedilol  25 mg Oral Daily  . enoxaparin (LOVENOX) injection  40 mg Subcutaneous Q24H  . feeding supplement (PRO-STAT SUGAR FREE 64)  30 mL Oral BID  . FLUoxetine  40 mg Oral Daily  . folic acid  1 mg Oral Daily  . gabapentin  300 mg Oral QHS  . insulin aspart  0-5 Units Subcutaneous QHS  . insulin aspart  0-9 Units Subcutaneous TID WC  . insulin glargine  46 Units Subcutaneous QHS  . lisinopril  10 mg  Oral Daily  . pantoprazole  40 mg Oral Daily  . sucralfate  1 g Oral BID  . tamsulosin  0.4 mg Oral Daily  . traZODone  100 mg Oral QHS    Continuous Infusions: . sodium chloride 75 mL/hr at 02/19/18 2131  . piperacillin-tazobactam (ZOSYN)  IV 3.375 g (02/20/18 0559)  . vancomycin 1,000 mg (02/20/18 5366)     Time spent: 40mns I have personally reviewed and interpreted on  02/20/2018 daily labs, imagings as  discussed above under date review session and assessment and plans.  I reviewed all nursing notes, pharmacy notes, consultant notes,  vitals, pertinent old records  I have discussed plan of care as described above with RN , patient  on 02/20/2018   Florencia Reasons MD, PhD  [facility] Pager [phone]. If 7PM-7AM, please contact night-coverage at [email or web address], password [facility] 02/20/2018, 12:52 PM  LOS: 2 days

## 2018-02-20 NOTE — Care Management Important Message (Signed)
Important Message  Patient Details  Name: Shane [name] MRN: [id number] Date of Birth: Sep 14, 1970   Medicare Important Message Given:  Yes    [name] 02/20/2018, 12:17 PM

## 2018-02-20 NOTE — Progress Notes (Signed)
Wound care done per order, Pt denies pain, tolerated well. Wound bed dry and pink, edges pale.

## 2018-02-20 NOTE — Evaluation (Signed)
Physical Therapy Evaluation Patient Details Name: Shane [name] MRN: [id number] DOB: July 10, 1970 Today's Date: 02/20/2018   History of Present Illness  Pt is a 47 y/o male admitted secondary to Skin ulceration of RLE. Pt with R foot cellulitis. PMH includes HTN, DM, dCHF, CKD, PAD, bipolar disorder, L transmetatarsal amputation and recent I & D of R foot.   Clinical Impression  Pt admitted secondary to problem above with deficits below. Pt requiring min guard to take steps at EOB with use of RW. Pt able to maintain NWB on RLE, however, pt with increased pain, so mobility limited. Pt reports he used WC prior to admission for mobility and has been having to maintain NWB on RLE for a few weeks. Reports wife is available to assist at home. Will continue to follow acutely to maximize functional mobility independence and safety.     Follow Up Recommendations No PT follow up;Supervision for mobility/OOB    Equipment Recommendations  Rolling walker with 5" wheels    Recommendations for Other Services       Precautions / Restrictions Precautions Precautions: Fall Restrictions Weight Bearing Restrictions: Yes RLE Weight Bearing: Non weight bearing Other Position/Activity Restrictions: per pt non weightbearing       Mobility  Bed Mobility Overal bed mobility: Modified Independent                Transfers Overall transfer level: Needs assistance Equipment used: Rolling walker (2 wheeled) Transfers: Sit to/from Stand Sit to Stand: Min guard         General transfer comment: Min guard for safety. Able to maintain NWB on RLE.   Ambulation/Gait Ambulation/Gait assistance: Min guard Gait Distance (Feet): 1 Feet Assistive device: Rolling walker (2 wheeled) Gait Pattern/deviations: Step-to pattern Gait velocity: Decreased   General Gait Details: Hop to gait pattern for 1-2 steps using RW. Pt with increased pain, therefore distance limited. Pt not wanting to get to chair secondary  to pain.   Stairs            Wheelchair Mobility    Modified Rankin (Stroke Patients Only)       Balance Overall balance assessment: Needs assistance Sitting-balance support: No upper extremity supported;Feet supported Sitting balance-Leahy Scale: Good     Standing balance support: Bilateral upper extremity supported;During functional activity Standing balance-Leahy Scale: Poor Standing balance comment: Reliant on BUE support                             Pertinent Vitals/Pain Pain Assessment: 0-10 Pain Score: 8  Pain Location: R foot  Pain Descriptors / Indicators: Sharp Pain Intervention(s): Limited activity within patient's tolerance;Monitored during session;Repositioned    Home Living Family/patient expects to be discharged to:: Private residence Living Arrangements: Spouse/significant other Available Help at Discharge: Family;Available 24 hours/day Type of Home: Apartment Home Access: Level entry     Home Layout: One level Home Equipment: Walker - 4 wheels;Cane - single point;Wheelchair - Education officer, community - power      Prior Function Level of Independence: Independent with assistive device(s)               Hand Dominance        Extremity/Trunk Assessment   Upper Extremity Assessment Upper Extremity Assessment: Overall WFL for tasks assessed    Lower Extremity Assessment Lower Extremity Assessment: LLE deficits/detail;RLE deficits/detail RLE Deficits / Details: R foot wrapped secondary to R foot wound.  LLE Deficits / Details: L transmetatarsal amputation  Cervical / Trunk Assessment Cervical / Trunk Assessment: Normal  Communication   Communication: No difficulties  Cognition Arousal/Alertness: Awake/alert Behavior During Therapy: WFL for tasks assessed/performed Overall Cognitive Status: Within Functional Limits for tasks assessed                                        General Comments       Exercises     Assessment/Plan    PT Assessment Patient needs continued PT services  PT Problem List Decreased strength;Decreased balance;Decreased activity tolerance;Decreased mobility;Decreased knowledge of use of DME;Pain       PT Treatment Interventions DME instruction;Gait training;Functional mobility training;Therapeutic activities;Balance training;Therapeutic exercise;Patient/family education    PT Goals (Current goals can be found in the Care Plan section)  Acute Rehab PT Goals Patient Stated Goal: to get my MRI done  PT Goal Formulation: With patient Time For Goal Achievement: 03/06/18 Potential to Achieve Goals: Good    Frequency Min 3X/week   Barriers to discharge        Co-evaluation               AM-PAC PT "6 Clicks" Daily Activity  Outcome Measure Difficulty turning over in bed (including adjusting bedclothes, sheets and blankets)?: None Difficulty moving from lying on back to sitting on the side of the bed? : A Little Difficulty sitting down on and standing up from a chair with arms (e.g., wheelchair, bedside commode, etc,.)?: Unable Help needed moving to and from a bed to chair (including a wheelchair)?: A Little Help needed walking in hospital room?: A Little Help needed climbing 3-5 steps with a railing? : A Lot 6 Click Score: 16    End of Session Equipment Utilized During Treatment: Gait belt Activity Tolerance: Patient limited by pain Patient left: in bed;with call bell/phone within reach Nurse Communication: Mobility status PT Visit Diagnosis: Unsteadiness on feet (R26.81);Difficulty in walking, not elsewhere classified (R26.2);Pain Pain - Right/Left: Right Pain - part of body: Ankle and joints of foot    Time: 1735-1750 PT Time Calculation (min) (ACUTE ONLY): 15 min   Charges:   PT Evaluation $PT Eval Low Complexity: [place], PT, DPT  Acute Rehabilitation Services  Pager: [phone] Office: [phone]   Rudean Hitt 02/20/2018, 6:25 PM

## 2018-02-21 ENCOUNTER — Inpatient Hospital Stay (HOSPITAL_COMMUNITY): Payer: Medicare HMO

## 2018-02-21 LAB — COMPREHENSIVE METABOLIC PANEL
ALK PHOS: 50 U/L (ref 38–126)
ALT: 10 U/L (ref 0–44)
AST: 11 U/L — AB (ref 15–41)
Albumin: 2.4 g/dL — ABNORMAL LOW (ref 3.5–5.0)
Anion gap: 8 (ref 5–15)
BILIRUBIN TOTAL: 0.3 mg/dL (ref 0.3–1.2)
BUN: 20 mg/dL (ref 6–20)
CALCIUM: 8.4 mg/dL — AB (ref 8.9–10.3)
CHLORIDE: 107 mmol/L (ref 98–111)
CO2: 26 mmol/L (ref 22–32)
CREATININE: 1.54 mg/dL — AB (ref 0.61–1.24)
GFR, EST AFRICAN AMERICAN: 60 mL/min — AB (ref >60)
GFR, EST NON AFRICAN AMERICAN: 52 mL/min — AB (ref >60)
Glucose, Bld: 124 mg/dL — ABNORMAL HIGH (ref 70–99)
Potassium: 4 mmol/L (ref 3.5–5.1)
Sodium: 141 mmol/L (ref 135–145)
TOTAL PROTEIN: 5.7 g/dL — AB (ref 6.5–8.1)

## 2018-02-21 LAB — CBC WITH DIFFERENTIAL/PLATELET
Abs Immature Granulocytes: 0 10*3/uL (ref 0.0–0.1)
BASOS PCT: 1 %
Basophils Absolute: 0.1 10*3/uL (ref 0.0–0.1)
EOS PCT: 5 %
Eosinophils Absolute: 0.4 10*3/uL (ref 0.0–0.7)
HEMATOCRIT: 30.1 % — AB (ref 39.0–52.0)
Hemoglobin: 9.3 g/dL — ABNORMAL LOW (ref 13.0–17.0)
IMMATURE GRANULOCYTES: 0 %
Lymphocytes Relative: 29 %
Lymphs Abs: 2 10*3/uL (ref 0.7–4.0)
MCH: 29.3 pg (ref 26.0–34.0)
MCHC: 30.9 g/dL (ref 30.0–36.0)
MCV: 95 fL (ref 78.0–100.0)
MONOS PCT: 7 %
Monocytes Absolute: 0.5 10*3/uL (ref 0.1–1.0)
NEUTROS PCT: 58 %
Neutro Abs: 4.1 10*3/uL (ref 1.7–7.7)
PLATELETS: 276 10*3/uL (ref 150–400)
RBC: 3.17 MIL/uL — AB (ref 4.22–5.81)
RDW: 12.2 % (ref 11.5–15.5)
WBC: 7.1 10*3/uL (ref 4.0–10.5)

## 2018-02-21 LAB — AEROBIC CULTURE  (SUPERFICIAL SPECIMEN)

## 2018-02-21 LAB — AEROBIC CULTURE W GRAM STAIN (SUPERFICIAL SPECIMEN): Gram Stain: NONE SEEN

## 2018-02-21 LAB — GLUCOSE, CAPILLARY
Glucose-Capillary: 181 mg/dL — ABNORMAL HIGH (ref 70–99)
Glucose-Capillary: 146 mg/dL — ABNORMAL HIGH (ref 70–99)

## 2018-02-21 MED ORDER — GADOBUTROL 1 MMOL/ML IV SOLN
8.0000 mL | Freq: Once | INTRAVENOUS | Status: AC | PRN
  Administered 2018-02-21: 8 mL via INTRAVENOUS

## 2018-02-21 MED ORDER — CIPROFLOXACIN IN D5W 400 MG/200ML IV SOLN
400.0000 mg | Freq: Two times a day (BID) | INTRAVENOUS | Status: DC
  Administered 2018-02-21 – 2018-02-22 (×2): 400 mg via INTRAVENOUS
  Filled 2018-02-21 (×4): qty 200

## 2018-02-21 NOTE — Progress Notes (Signed)
Wound to the right foot and right great toe was irrigated with normal saline. The wound cleaned up well.  No active bleeding noted. The wound to the right great toe appears open.  Dr Erlinda Hong at the bedside for examination of the wound.  The patient reports having feeling while the wound was being clean.  The wound was redressed as ordered and Kerlix wrap placed.  Patient was encouraged to keep the right foot elevated.

## 2018-02-21 NOTE — Progress Notes (Addendum)
PROGRESS NOTE  Shane [name] WUJ:[id number] DOB: May 11, 1971 DOA: 02/18/2018 PCP: Charlott Rakes, MD  HPI/Recap of past 24 hours:  C/o right big toe has been numb for a week Open wound to right bid toe continue to have bloody drainage No fever, he reports chills  He finally got the mri done today  Assessment/Plan: Principal Problem:   Cellulitis of foot, right Active Problems:   HLD (hyperlipidemia)   Essential hypertension   GERD (gastroesophageal reflux disease)   Depression with anxiety   Diabetes mellitus type 2, uncontrolled, with complications (HCC)   Chronic diastolic CHF (congestive heart failure), NYHA class 1 (HCC)   CKD (chronic kidney disease) stage 3, GFR 30-59 ml/min (HCC)   PAD (peripheral artery disease) (HCC)   R foot cellulitis in DM foot wound, wound is open and draining with edema --patient reports he has been on doxycycline for a month at home,  ---Esr/crp elevated -he was started on vanc/zosyn since admission, superficial wound culture + MSSA, blood culture no growth, abc change to cipro -continue wound care -mri concerning for cellulitis/myositis/osteomyelitis, will follow Dr Sharol Given recommendation  Insulin dependent dm2, with peripheral neuropathy a1c 7.3, continue insulin, adjust prn Continue neurontin  HTN -cont home meds: lisinopril, norvasc, coreg  HLD  -cont lipitor  CKD III at baseline -daily BMP -renally dose meds  Depression/anxiety -cont tegretol, trazodone, prozac  Baseline wheelchair bound  Code Status: full  Family Communication: patient   Disposition Plan: not ready to discharge   Consultants:  Orthopedics   Procedures:  none  Antibiotics:  Vanc/zosyn from admission to 9/7  cipro from 9/7 to   Objective: BP 136/81 (BP Location: Right Arm)   Pulse 81   Temp 98.3 F (36.8 C) (Oral)   Resp 18   Ht _0  (1.702 m)   Wt 89.7 kg   SpO2 94%   BMI 30.97 kg/m   Intake/Output Summary (Last 24 hours) at  02/21/2018 0754 Last data filed at 02/20/2018 1700 Gross per 24 hour  Intake 720 ml  Output 1940 ml  Net -1220 ml   Filed Weights   02/18/18 1730  Weight: 89.7 kg    Exam: Patient is examined daily including today on 02/21/2018, exams remain the same as of yesterday except that has changed    General:  NAD  Cardiovascular: RRR  Respiratory: CTABL  Abdomen: Soft/ND/NT, positive BS  Musculoskeletal: right foot open wound, right bid toe appear swollen (see pic below , pic taken on 9/5), he has decreased sensation , he does not feel pain in right toe. Left foot partial amputation  Neuro: alert, oriented   pic taken on 9/7   pic taken on 9/5        Data Reviewed: Basic Metabolic Panel: Recent Labs  Lab 02/18/18 0939 02/19/18 0422 02/20/18 0456 02/21/18 0520  NA 138 140 141 141  K 4.4 4.2 4.6 4.0  CL 101 104 105 107  CO2 _1 GLUCOSE 171* 157* 132* 124*  BUN _2 CREATININE 1.40* 1.43* 1.57* 1.54*  CALCIUM 9.3 8.6* 9.0 8.4*   Liver Function Tests: Recent Labs  Lab 02/21/18 0520  AST 11*  ALT 10  ALKPHOS 50  BILITOT 0.3  PROT 5.7*  ALBUMIN 2.4*   No results for input(s): LIPASE, AMYLASE in the last 168 hours. No results for input(s): AMMONIA in the last 168 hours. CBC: Recent Labs  Lab 02/18/18 0939 02/19/18 0422 02/20/18 0456 02/21/18 0520  WBC  9.8 9.2 7.1 7.1  NEUTROABS 8.0*  --   --  4.1  HGB 12.0* 10.9* 10.9* 9.3*  HCT 39.2 34.0* 35.4* 30.1*  MCV 96.1 93.7 95.2 95.0  PLT 300 282 307 276   Cardiac Enzymes:   No results for input(s): CKTOTAL, CKMB, CKMBINDEX, TROPONINI in the last 168 hours. BNP (last 3 results) No results for input(s): BNP in the last 8760 hours.  ProBNP (last 3 results) No results for input(s): PROBNP in the last 8760 hours.  CBG: Recent Labs  Lab 02/19/18 2142 02/20/18 0720 02/20/18 1137 02/20/18 1615 02/20/18 2131  GLUCAP 195* 109* 215* 105* 234*    Recent Results (from the past 240  hour(s))  Wound or Superficial Culture     Status: None (Preliminary result)   Collection Time: 02/18/18 11:48 AM  Result Value Ref Range Status   Specimen Description WOUND RIGHT FOOT  Final   Special Requests NONE  Final   Gram Stain   Final    NO SQUAMOUS EPITHELIAL CELLS SEEN NO WBC SEEN MODERATE GRAM POSITIVE COCCI    Culture   Final    MODERATE STAPHYLOCOCCUS AUREUS CULTURE REINCUBATED FOR BETTER GROWTH Performed at [facility], [place] [address] [place], [place] [address]    Report Status PENDING  Incomplete  Culture, blood (routine x 2)     Status: None (Preliminary result)   Collection Time: 02/18/18  4:24 PM  Result Value Ref Range Status   Specimen Description BLOOD RIGHT HAND  Final   Special Requests   Final    BOTTLES DRAWN AEROBIC AND ANAEROBIC Blood Culture results may not be optimal due to an excessive volume of blood received in culture bottles   Culture  Setup Time PENDING  Incomplete   Culture   Final    NO GROWTH 2 DAYS Performed at [facility], [place] [address], [place], [place] [address]    Report Status PENDING  Incomplete  Culture, blood (routine x 2)     Status: None (Preliminary result)   Collection Time: 02/18/18  4:24 PM  Result Value Ref Range Status   Specimen Description BLOOD LEFT ANTECUBITAL  Final   Special Requests   Final    BOTTLES DRAWN AEROBIC AND ANAEROBIC Blood Culture results may not be optimal due to an excessive volume of blood received in culture bottles   Culture   Final    NO GROWTH 2 DAYS Performed at [facility], [place] [address] [place], [place] [address]    Report Status PENDING  Incomplete     Studies: No results found.  Scheduled Meds: . amLODipine  5 mg Oral Daily  . aspirin EC  81 mg Oral Daily  . atorvastatin  80 mg Oral q morning - 10a  . carbamazepine  200 mg Oral QHS  . carvedilol  25 mg Oral Daily  . enoxaparin (LOVENOX) injection  40 mg Subcutaneous Q24H  . feeding supplement (PRO-STAT  SUGAR FREE 64)  30 mL Oral BID  . FLUoxetine  40 mg Oral Daily  . folic acid  1 mg Oral Daily  . gabapentin  300 mg Oral QHS  . insulin aspart  0-5 Units Subcutaneous QHS  . insulin aspart  0-9 Units Subcutaneous TID WC  . insulin glargine  46 Units Subcutaneous QHS  . lisinopril  10 mg Oral Daily  . pantoprazole  40 mg Oral Daily  . sucralfate  1 g Oral BID  . tamsulosin  0.4  mg Oral Daily  . traZODone  100 mg Oral QHS    Continuous Infusions: . sodium chloride 75 mL/hr at 02/20/18 1410  . piperacillin-tazobactam (ZOSYN)  IV 3.375 g (02/21/18 0548)  . vancomycin 1,000 mg (02/21/18 0016)     Time spent: 26mns I have personally reviewed and interpreted on  02/21/2018 daily labs, imagings as discussed above under date review session and assessment and plans.  I reviewed all nursing notes, pharmacy notes, consultant notes,  vitals, pertinent old records  I have discussed plan of care as described above with RN , patient  on 02/21/2018   FFlorencia ReasonsMD, PhD  [facility] Pager [phone] If 7PM-7AM, please contact night-coverage at [email or web address], password [email or web address]9/12/2017, 7:54 AM  LOS: 3 days

## 2018-02-22 LAB — GLUCOSE, CAPILLARY
Glucose-Capillary: 166 mg/dL — ABNORMAL HIGH (ref 70–99)
Glucose-Capillary: 124 mg/dL — ABNORMAL HIGH (ref 70–99)
Glucose-Capillary: 163 mg/dL — ABNORMAL HIGH (ref 70–99)

## 2018-02-22 LAB — SEDIMENTATION RATE: Sed Rate: 104 mm/hr — ABNORMAL HIGH (ref 0–16)

## 2018-02-22 LAB — C-REACTIVE PROTEIN: CRP: 2.4 mg/dL — ABNORMAL HIGH (ref <1.0)

## 2018-02-22 MED ORDER — CARVEDILOL 25 MG PO TABS: 25.0000 mg | ORAL_TABLET | Freq: Two times a day (BID) | ORAL | Status: DC

## 2018-02-22 MED ORDER — CIPROFLOXACIN HCL 500 MG PO TABS: 500.0000 mg | ORAL_TABLET | Freq: Two times a day (BID) | ORAL | 0 refills | Status: DC

## 2018-02-22 NOTE — Discharge Summary (Signed)
Discharge Summary  Shane [name] MGN:[id number] DOB: [birth year], Shane [name], Shane [name], Shane [name] follow [name] 2. F/u with ortho [name] 9/10  Discharge Diagnoses:  Active Hospital Problems   Diagnosis Date Noted  . Cellulitis of foot, right 08/07/2017  . PAD (peripheral artery disease) ([facility]   . CKD (chronic kidney disease) stage 3, GFR 30-59 ml/min (HCC) 08/27/2016  . Chronic diastolic CHF (congestive heart failure), NYHA class 1 ([facility] 12/30/2015  . Diabetes mellitus type 2, uncontrolled, with complications ([facility] 091/69/4503 . Depression with anxiety 12/01/2015  . GERD (gastroesophageal reflux disease) 08/18/2015  . Essential hypertension 04/02/2015  . HLD (hyperlipidemia)     Resolved Hospital Problems  No resolved problems to display.    Discharge Condition: stable  Diet recommendation: heart healthy/carb modified  Filed Weights   02/18/18 1730  Weight: 89.7 kg    History of present illness: (per admitting Shane Dr LSteffanie Dunn PCP: [name] Shane Consultants:  none Patient coming from: home- lives with  Chief Complaint: Foot wound  HPI: ERaekwan Spelmanis a 47y.o. male with medical history significant for DM2, chronic diabetic foot ulcer f/b Dr. DSharol Givenwho is s/p I and D 01/21/2018, recently completed a month of doxycycline 100 mg BID who came to the ED today c/o an "opening" of his R great toe wound. He states [facility] and complained of cracking/opening [name] of over the plantar surface of the base of the first metatarsal. He was instructed to continue his doxycycline and stay off the foot and keep it elevated, as well as to wash with soap and water and use Silvadene once a day. Also to cover the area with gauze and Ace wrap and use postop shoe, all of which  he states he has been doing. He reports that he lost feeling in that great toe since Sunday, and admits that he stuck a sterilized (with alcohol) needle into the open wound that day to test his sensation and he couldn't feel the needle. He states that pus has been draining from the wound for about 3 days and the area around it has become red and warm.  ED Course: No drainage currently, ESR 112, CRP elevated, WBC nL Xray no gas, no evidence of OM Baseline renal insufficiency Vanc and Zosyn given  Hospital Course:  Principal Problem:   Cellulitis of foot, right Active Problems:   HLD (hyperlipidemia)   Essential hypertension   GERD (gastroesophageal reflux disease)   Depression with anxiety   Diabetes mellitus type 2, uncontrolled, with complications (HCC)   Chronic diastolic CHF (congestive heart failure), NYHA class 1 (HCC)   CKD (chronic kidney disease) stage 3, GFR 30-59 ml/min (HCC)   PAD (peripheral artery disease) (HCC)  R foot cellulitis in DM foot wound, wound is open and draining with edema --patient reports he has been on doxycycline for a month [name] home,  ---Esr/crp elevated -he was started on vanc/zosyn since admission, superficial wound culture + MSSA, blood culture no growth, abc change to cipro -continue wound care "Cleanse right toe with soap and water and pat dry. Wrap toe with Aquacel Ag strip.  Cover with gauze and tape.  Change daily." -mri concerning for cellulitis/myositis/osteomyelitis, case discussed with  Dr DSharol Givenwho recommended d/c home with oral  abx, he is to evaluated patient in his office on 9/10, possible surgery on 9/11.  Insulin dependent dm2, with peripheral neuropathy a1c 7.3, continue insulin, adjust prn Continue neurontin  HTN -cont home meds: lisinopril, norvasc, coreg  HLD  -cont lipitor  CKD III [name] baseline -stable [name] baseline -renally dose meds  Depression/anxiety -cont tegretol, trazodone, prozac  Baseline wheelchair/walker,   able to stand [name] without assistant   Code Status: full  Family Communication: patient   Disposition Plan: d/c home, case discussed with ortho Dr Sharol Given, Dr Sharol Given is going to see patient on 9/10. Patient is in agreement.   Consultants:  Orthopedics   Procedures:  none  Antibiotics:  Vanc/zosyn from admission to 9/7  cipro from 9/7 to   Discharge Exam: BP (!) 153/86   Pulse 81   Temp 97.9 F (36.6 C) (Oral)   Resp 16   Ht 5' 7"  (1.702 m)   Wt 89.7 kg   SpO2 (!) 89%   BMI 30.97 kg/m     General:  NAD  Cardiovascular: RRR  Respiratory: CTABL  Abdomen: Soft/ND/NT, positive BS  Musculoskeletal: right foot open wound, right bid toe appear swollen (see pic below , pic taken on 9/5), he has decreased sensation , he does not feel pain in right toe. Left foot partial amputation  Neuro: alert, oriented      Discharge Instructions You were cared for by a hospitalist during your hospital stay. If you have any questions about your discharge medications or the care you received while you were in the hospital after you are discharged, you can call the unit and asked to speak with the hospitalist on call if the hospitalist that took care of you is not available. Once you are discharged, your primary care physician will handle any further medical issues. Please note that NO REFILLS for any discharge medications will be authorized once you are discharged, as it is imperative that you return to your primary care physician (or establish a relationship with a primary care physician if you do not have one) for your aftercare needs so that they can reassess your need for medications and monitor your lab values.  Discharge Instructions    Diet - low sodium heart healthy   Complete by:  As directed    Carb modified diet   Increase activity slowly   Complete by:  As directed      Allergies as of 02/22/2018      Reactions   Other Diarrhea, Other (See Comments)   Red meat  causes stomach pains, bloating and diarrhea   Lactose Intolerance (gi) Diarrhea, Other (See Comments)   Bloating       Medication List    STOP taking these medications   acetaminophen 325 MG tablet Commonly known as:  TYLENOL   acetaminophen 500 MG tablet Commonly known as:  TYLENOL   doxycycline 100 MG tablet Commonly known as:  VIBRA-TABS   folic acid 1 MG tablet Commonly known as:  FOLVITE   oxyCODONE 5 MG immediate release tablet Commonly known as:  Oxy IR/ROXICODONE   Vitamin D (Ergocalciferol) 50000 units Caps capsule Commonly known as:  DRISDOL     TAKE these medications   amLODipine 5 MG tablet Commonly known as:  NORVASC Take 1 tablet (5 mg total) by mouth daily.   aspirin EC 81 MG tablet Take 1 tablet (81 mg total) by mouth daily.   atorvastatin 80 MG tablet Commonly known as:  LIPITOR Take 1 tablet (  80 mg total) by mouth every morning.   carbamazepine 200 MG tablet Commonly known as:  TEGRETOL TAKE 1 TABLET BY MOUTH [name] BEDTIME.   carvedilol 25 MG tablet Commonly known as:  COREG Take 1 tablet (25 mg total) by mouth 2 (two) times daily with a meal. What changed:  when to take this   ciprofloxacin 500 MG tablet Commonly known as:  CIPRO Take 1 tablet (500 mg total) by mouth 2 (two) times daily for 5 days.   FLUoxetine 40 MG capsule Commonly known as:  PROZAC Take 1 capsule (40 mg total) by mouth daily.   gabapentin 300 MG capsule Commonly known as:  NEURONTIN Take 1 capsule (300 mg total) by mouth 2 (two) times daily. What changed:  when to take this   glipiZIDE 10 MG 24 hr tablet Commonly known as:  GLUCOTROL XL Take 1 tablet (10 mg total) by mouth daily with breakfast.   Insulin Glargine 100 UNIT/ML Solostar Pen Commonly known as:  LANTUS Inject 35 Units into the skin 2 (two) times daily. What changed:    how much to take  when to take this   lisinopril 10 MG tablet Commonly known as:  PRINIVIL,ZESTRIL Take 1 tablet (10 mg total)  by mouth daily.   methocarbamol 500 MG tablet Commonly known as:  ROBAXIN Take 1 tablet (500 mg total) by mouth every 6 (six) hours as needed for muscle spasms.   pantoprazole 40 MG tablet Commonly known as:  PROTONIX Take 1 tablet (40 mg total) by mouth daily.   polyethylene glycol packet Commonly known as:  MIRALAX / GLYCOLAX Take 17 g by mouth daily as needed for moderate constipation.   senna-docusate 8.6-50 MG tablet Commonly known as:  Senokot-S Take 1 tablet by mouth [name] bedtime. What changed:    when to take this  reasons to take this   sucralfate 1 g tablet Commonly known as:  CARAFATE TAKE 1 TABLET (1 G TOTAL) BY MOUTH 4 (FOUR) TIMES DAILY - WITH MEALS AND [name] BEDTIME. What changed:  when to take this   tamsulosin 0.4 MG Caps capsule Commonly known as:  FLOMAX Take 1 capsule (0.4 mg total) by mouth daily.   traZODone 100 MG tablet Commonly known as:  DESYREL Take 1 tablet (100 mg total) by mouth [name] bedtime.      Allergies  Allergen Reactions  . Other Diarrhea and Other (See Comments)    Red meat causes stomach pains, bloating and diarrhea  . Lactose Intolerance (Gi) Diarrhea and Other (See Comments)    Bloating    Follow-[name] Information    Newt Minion, Shane Follow [name] on 02/24/2018.   Specialty:  Orthopedic Surgery Why:  please call Dr Jess Barters office on monday to confirm time of appointment on 9/10. Contact information: [place] [address] [phone]        Charlott Rakes, Shane Follow [name] in 1 week(s).   Specialty:  Family Medicine Why:  hospital discharge follow [name] Contact information: [place] [address] [phone]            The results of significant diagnostics from this hospitalization (including imaging, microbiology, ancillary and laboratory) are listed below for reference.    Significant Diagnostic Studies: Dg Chest 2 View  Result Date: 02/08/2018 CLINICAL DATA:  Chest pain  EXAM: CHEST - 2 VIEW COMPARISON:  01/24/2018 chest radiograph. FINDINGS: Stable cardiomediastinal silhouette with normal heart size. No pneumothorax. No pleural effusion. Lungs appear  clear, with no acute consolidative airspace disease and no pulmonary edema. IMPRESSION: No active cardiopulmonary disease. Electronically Signed   By: Ilona Sorrel M.D.   On: 02/08/2018 14:00   Ct Angio Chest Pe W Or Wo Contrast  Result Date: 01/24/2018 CLINICAL DATA:  Acute onset of generalized chest pain and shortness of breath. High blood pressure. EXAM: CT ANGIOGRAPHY CHEST WITH CONTRAST TECHNIQUE: Multidetector CT imaging of the chest was performed using the standard protocol during bolus administration of intravenous contrast. Multiplanar CT image reconstructions and MIPs were obtained to evaluate the vascular anatomy. CONTRAST:  66m ISOVUE-370 IOPAMIDOL (ISOVUE-370) INJECTION 76% COMPARISON:  Chest radiograph performed earlier today [name]:03 a.m., and CTA of the chest performed 06/27/2016 FINDINGS: Cardiovascular:  There is no evidence of pulmonary embolus. The heart is normal in size. Scattered coronary artery calcifications are seen. The thoracic aorta is grossly unremarkable. The great vessels are within normal limits. Mediastinum/Nodes: The mediastinum is unremarkable in appearance. No mediastinal lymphadenopathy is seen. No pericardial effusion is identified. The thyroid gland is unremarkable. No axillary lymphadenopathy is seen. Lungs/Pleura: Trace bilateral pleural fluid is noted, with bibasilar airspace opacities likely reflecting atelectasis. No pneumothorax is seen. No masses are identified. Upper Abdomen: The visualized portions of the liver and spleen are unremarkable. The gallbladder is unremarkable in appearance. The visualized portions of the pancreas, adrenal glands and kidneys are grossly unremarkable. Nonspecific perinephric stranding is noted bilaterally. Musculoskeletal: No acute osseous abnormalities are  identified. The visualized musculature is unremarkable in appearance. Review of the MIP images confirms the above findings. IMPRESSION: 1. No evidence of pulmonary embolus. 2. Trace bilateral pleural fluid, with bibasilar airspace opacities likely reflecting atelectasis. 3. Scattered coronary artery calcifications seen. Electronically Signed   By: JGarald BaldingM.D.   On: 01/24/2018 05:24   Mr Foot Right W Wo Contrast  Result Date: 02/21/2018 CLINICAL DATA:  Open wound of the right foot on the first digit. Prior surgeries. Diabetes. EXAM: MRI OF THE RIGHT FOREFOOT WITHOUT AND WITH CONTRAST TECHNIQUE: Multiplanar, multisequence MR imaging of the right forefoot was performed before and after the administration of intravenous contrast. CONTRAST:  8 cc Gadavist COMPARISON:  Multiple exams, including 01/12/2018 and radiographs of 02/18/2018 FINDINGS: Bones/Joint/Cartilage Low-grade but abnormal edema and enhancement in the phalanges of the great toe; head and shaft of the first metatarsal; and in the head and distal shaft of the second metatarsal suspicious for early osteomyelitis. The first digit sesamoids do not appear to abnormally enhance. Trace effusion of the first MTP joint dorsally. Ligaments Today's exam focused on the distal forefoot, and the level of the Lisfranc ligament was omitted. Muscles and Tendons Abnormal edema signal and enhancement especially in the abductor hallucis muscle compatible with myositis. There is a lesser degree of abnormal muscular edema and enhancement in the interosseous muscles and remaining plantar musculature of the foot, some of which may be neurogenic. Soft tissues Deep ulceration of the medial aspect of the great toe extending to the periosteal margin of the base of the distal phalanx as shown on image 26/11. Additional ulceration medial to the first MTP joint with surrounding subcutaneous edema and inflammatory stranding compatible with cellulitis which extends into the  great toe. Questionable foreign body along the dorsum of the foot on image 6/11, dorsal to the second metatarsal shaft, with some surrounding edema and enhancement. This could also represent a small locule of gas. IMPRESSION: 1. Deep ulceration of the medial great toe extending to the base of the distal phalanx  periosteal margin. There is abnormal edema and enhancement in the phalanges of the great toe; in the head and shaft of the first metatarsal; and in the head and distal shaft of the second metatarsal suspicious for early osteomyelitis. 2. 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 3. Cellulitis of the great toe and medial distal foot. 4. Shallow ulceration medial to the first MTP joint. 5. Low signal intensity structure potentially a small foreign body or locule of gas in the dorsal subcutaneous tissues of the foot on image 6/6, dorsal to the level of the second metatarsal, not readily visible on conventional radiography. Electronically Signed   By: Van Clines M.D.   On: 02/21/2018 13:54   Dg Chest Port 1 View  Result Date: 01/24/2018 CLINICAL DATA:  Acute onset chest pain with dyspnea. Patient had surgery on Wednesday. EXAM: PORTABLE CHEST 1 VIEW COMPARISON:  07/12/2016 FINDINGS: Low lung volumes with platelike atelectasis and/or scarring [name] the left lung base. Otherwise, no pulmonary consolidation, effusion or pneumothorax. Mild peribronchial thickening and increased interstitial lung markings likely reflecting chronic bronchitic change. No acute osseous abnormality. IMPRESSION: 1. Low lung volumes with crowding of interstitial lung markings. 2. Slight increase in interstitial lung markings with peribronchial thickening may represent chronic bronchitic change. Electronically Signed   By: Ashley Royalty M.D.   On: 01/24/2018 03:23   Dg Foot Complete Right  Result Date: 02/18/2018 CLINICAL DATA:  Diabetic patient with a wound  on the right great toe. EXAM: RIGHT FOOT COMPLETE - 3+ VIEW COMPARISON:  Plain films right foot 02/08/2018. MRI right foot 01/12/2018. FINDINGS: Skin wound is seen along the medial aspect of the first MTP joint. Soft tissues of the foot are swollen. No soft tissue gas or radiopaque foreign body. No bony destructive change or periosteal reaction. IMPRESSION: Skin ulceration soft tissue swelling without evidence of osteomyelitis. Electronically Signed   By: Inge Rise M.D.   On: 02/18/2018 11:14   Dg Foot Complete Right  Result Date: 02/08/2018 CLINICAL DATA:  Poor healing surgical wounds to right foot. History of diabetes. EXAM: RIGHT FOOT COMPLETE - 3+ VIEW COMPARISON:  None. FINDINGS: No fractures. No evidence of osteomyelitis. Vascular calcifications are noted. IMPRESSION: No evidence of osteomyelitis. Electronically Signed   By: Dorise Bullion III M.D   On: 02/08/2018 15:14    Microbiology: Recent Results (from the past 240 hour(s))  Wound or Superficial Culture     Status: None   Collection Time: 02/18/18 11:48 AM  Result Value Ref Range Status   Specimen Description WOUND RIGHT FOOT  Final   Special Requests NONE  Final   Gram Stain   Final    NO SQUAMOUS EPITHELIAL CELLS SEEN NO WBC SEEN MODERATE GRAM POSITIVE COCCI    Culture   Final    MODERATE STAPHYLOCOCCUS AUREUS WITHIN MIXED ORGANISMS Performed [name] [facility], [place] [address] [place], [place] [address]    Report Status 02/21/2018 FINAL  Final   Organism ID, Bacteria STAPHYLOCOCCUS AUREUS  Final      Susceptibility   Staphylococcus aureus - MIC*    CIPROFLOXACIN <=0.5 SENSITIVE Sensitive     ERYTHROMYCIN <=0.25 SENSITIVE Sensitive     GENTAMICIN <=0.5 SENSITIVE Sensitive     OXACILLIN 0.5 SENSITIVE Sensitive     TETRACYCLINE <=1 SENSITIVE Sensitive     VANCOMYCIN 1 SENSITIVE Sensitive     TRIMETH/SULFA <=10 SENSITIVE Sensitive     CLINDAMYCIN <=0.25 SENSITIVE Sensitive  RIFAMPIN <=0.5 SENSITIVE  Sensitive     Inducible Clindamycin NEGATIVE Sensitive     * MODERATE STAPHYLOCOCCUS AUREUS  Culture, blood (routine x 2)     Status: None (Preliminary result)   Collection Time: 02/18/18  4:24 PM  Result Value Ref Range Status   Specimen Description BLOOD RIGHT HAND  Final   Special Requests   Final    BOTTLES DRAWN AEROBIC AND ANAEROBIC Blood Culture results may not be optimal due to an excessive volume of blood received in culture bottles   Culture   Final    NO GROWTH 4 DAYS Performed [name] [facility], [place] [address], [place], [place] [address]    Report Status PENDING  Incomplete  Culture, blood (routine x 2)     Status: None (Preliminary result)   Collection Time: 02/18/18  4:24 PM  Result Value Ref Range Status   Specimen Description BLOOD LEFT ANTECUBITAL  Final   Special Requests   Final    BOTTLES DRAWN AEROBIC AND ANAEROBIC Blood Culture results may not be optimal due to an excessive volume of blood received in culture bottles   Culture   Final    NO GROWTH 4 DAYS Performed [name] [facility], [place] [address], [place], [place] [address]    Report Status PENDING  Incomplete     Labs: Basic Metabolic Panel: Recent Labs  Lab 02/18/18 0939 02/19/18 0422 02/20/18 0456 02/21/18 0520  NA 138 140 141 141  K 4.4 4.2 4.6 4.0  CL 101 104 105 107  CO2 30 28 30 26   GLUCOSE 171* 157* 132* 124*  BUN 15 17 16 20   CREATININE 1.40* 1.43* 1.57* 1.54*  CALCIUM 9.3 8.6* 9.0 8.4*   Liver Function Tests: Recent Labs  Lab 02/21/18 0520  AST 11*  ALT 10  ALKPHOS 50  BILITOT 0.3  PROT 5.7*  ALBUMIN 2.4*   No results for input(s): LIPASE, AMYLASE in the last 168 hours. No results for input(s): AMMONIA in the last 168 hours. CBC: Recent Labs  Lab 02/18/18 0939 02/19/18 0422 02/20/18 0456 02/21/18 0520  WBC 9.8 9.2 7.1 7.1  NEUTROABS 8.0*  --   --  4.1  HGB 12.0* 10.9* 10.9* 9.3*  HCT 39.2 34.0* 35.4* 30.1*  MCV 96.1 93.7 95.2 95.0  PLT 300 282 307 276    Cardiac Enzymes: No results for input(s): CKTOTAL, CKMB, CKMBINDEX, TROPONINI in the last 168 hours. BNP: BNP (last 3 results) No results for input(s): BNP in the last 8760 hours.  ProBNP (last 3 results) No results for input(s): PROBNP in the last 8760 hours.  CBG: Recent Labs  Lab 02/21/18 0854 02/21/18 1642 02/22/18 0129 02/22/18 0753 02/22/18 1144  GLUCAP 181* 146* 166* 124* 163*       Signed:  Florencia Reasons MD, PhD  [facility] 02/22/2018, 12:23 PM

## 2018-02-22 NOTE — Progress Notes (Signed)
Written and verbal discharge instructions given to the patient.  Patient will be discharged via wheel chair to home with follow up with Dr Sharol Given.

## 2018-02-23 ENCOUNTER — Other Ambulatory Visit: Payer: Self-pay

## 2018-02-23 ENCOUNTER — Telehealth (INDEPENDENT_AMBULATORY_CARE_PROVIDER_SITE_OTHER): Payer: Self-pay | Admitting: Orthopedic Surgery

## 2018-02-23 DIAGNOSIS — R262 Difficulty in walking, not elsewhere classified: Secondary | ICD-10-CM | POA: Diagnosis not present

## 2018-02-23 DIAGNOSIS — L02611 Cutaneous abscess of right foot: Secondary | ICD-10-CM | POA: Diagnosis not present

## 2018-02-23 DIAGNOSIS — L03115 Cellulitis of right lower limb: Secondary | ICD-10-CM | POA: Diagnosis not present

## 2018-02-23 DIAGNOSIS — A4901 Methicillin susceptible Staphylococcus aureus infection, unspecified site: Secondary | ICD-10-CM | POA: Diagnosis not present

## 2018-02-23 DIAGNOSIS — Z89432 Acquired absence of left foot: Secondary | ICD-10-CM | POA: Diagnosis not present

## 2018-02-23 DIAGNOSIS — E11621 Type 2 diabetes mellitus with foot ulcer: Secondary | ICD-10-CM | POA: Diagnosis not present

## 2018-02-23 DIAGNOSIS — L97503 Non-pressure chronic ulcer of other part of unspecified foot with necrosis of muscle: Secondary | ICD-10-CM | POA: Diagnosis not present

## 2018-02-23 DIAGNOSIS — L03116 Cellulitis of left lower limb: Secondary | ICD-10-CM | POA: Diagnosis not present

## 2018-02-23 LAB — CULTURE, BLOOD (ROUTINE X 2)
Culture: NO GROWTH
Culture: NO GROWTH

## 2018-02-23 MED FILL — traZODone HCL 100 MG TABS: 100 | 90 days supply | Qty: 180 | Fill #0

## 2018-02-23 MED FILL — CIPROFLOXACIN HCL 500 MG TA: 500 | 5 days supply | Qty: 10 | Fill #0

## 2018-02-23 NOTE — Telephone Encounter (Signed)
I will hold message for appt tomorrow. Will call with updated wound care orders as this is follow up from ER visit.

## 2018-02-23 NOTE — Telephone Encounter (Signed)
[facility] at [place] is requesting resumption orders to continue nursing visits for this patient. Her CB # [phone]

## 2018-02-24 ENCOUNTER — Ambulatory Visit (INDEPENDENT_AMBULATORY_CARE_PROVIDER_SITE_OTHER): Payer: Medicare HMO | Admitting: Orthopedic Surgery

## 2018-02-24 ENCOUNTER — Encounter (INDEPENDENT_AMBULATORY_CARE_PROVIDER_SITE_OTHER): Payer: Self-pay | Admitting: Physician Assistant

## 2018-02-24 VITALS — Ht 67.0 in | Wt 197.0 lb

## 2018-02-24 DIAGNOSIS — M86271 Subacute osteomyelitis, right ankle and foot: Secondary | ICD-10-CM

## 2018-02-24 DIAGNOSIS — L97413 Non-pressure chronic ulcer of right heel and midfoot with necrosis of muscle: Secondary | ICD-10-CM

## 2018-02-24 DIAGNOSIS — E11621 Type 2 diabetes mellitus with foot ulcer: Secondary | ICD-10-CM

## 2018-02-24 NOTE — Telephone Encounter (Signed)
I called to advise that the pt is having a 1st ray amputation Friday.  It will be out pt but he will not need [facility] to eval the pt until after first post op. At that time if [facility] is needed will set up at that time.

## 2018-02-24 NOTE — Progress Notes (Signed)
Office Visit Note   Patient: Shane [name]           Date of Birth: [birth year]           MRN: [id number] Visit Date: 02/24/2018              Requested by: Charlott Rakes, MD [place], [place] [address] PCP: Charlott Rakes, MD  Chief Complaint  Patient presents with  . Right Foot - Follow-up    Diabetic ulcer right foot      HPI: Patient is a 47 year old gentleman who is status post debridement of a midfoot ulcer dorsally to the right foot.  Patient has had multiple hospital admissions recently with complaints of ulceration to the right great toe.  Patient states that he has probed this with a sharp needle to see if he can drain the infection  Assessment & Plan: Visit Diagnoses:  1. Subacute osteomyelitis, right ankle and foot ([place])   2. Diabetic ulcer of right midfoot associated with type 2 diabetes mellitus, with necrosis of muscle ([place])     Plan: We will plan for first ray amputation due to the osteomyelitis of the first metatarsal and complete the first ray amputation to remove the nonviable skin and soft tissue over the medial column of the first metatarsal.  Risks and benefits were discussed including persistent infection nonhealing of the incision.  Patient states he understands wished to proceed with surgery on Friday as an outpatient we will plan for a wound VAC and follow-up in the office in 1 week after surgery.  Follow-Up Instructions: Return in about 2 weeks (around 03/10/2018).   Ortho Exam  Patient is alert, oriented, no adenopathy, well-dressed, normal affect, normal respiratory effort. Examination patient has an antalgic gait.  He has a new ulcer over the mid aspect of the right great toe this is not on a pressure point.  Appears to be where patient had probed his toe to try to decompress of infection.  The ulcer probes all the way down to bone.  He also has a area of skin breakdown over the medial border of the first metatarsal.  Review of the  MRI scan shows bony changes consistent with osteomyelitis over the great toe.  There is no ascending cellulitis he does have swelling he denies any pain.  Imaging: No results found. No images are attached to the encounter.  Labs: Lab Results  Component Value Date   HGBA1C 7.3 (H) 02/20/2018   HGBA1C 9.4 (A) 11/20/2017   HGBA1C 10.1 07/04/2017   ESRSEDRATE 104 (H) 02/22/2018   ESRSEDRATE 112 (H) 02/18/2018   ESRSEDRATE 45 (H) 01/10/2018   CRP 2.4 (H) 02/22/2018   CRP 7.3 (H) 02/18/2018   CRP 1.2 (H) 01/10/2018   REPTSTATUS 02/23/2018 FINAL 02/18/2018   REPTSTATUS 02/23/2018 FINAL 02/18/2018   GRAMSTAIN  02/18/2018    NO SQUAMOUS EPITHELIAL CELLS SEEN NO WBC SEEN MODERATE GRAM POSITIVE COCCI    CULT  02/18/2018    NO GROWTH 5 DAYS Performed at [facility], [place] [address], [place], [place] [address]    CULT  02/18/2018    NO GROWTH 5 DAYS Performed at [facility] [address], [place],  [address]    [place] 02/18/2018     Lab Results  Component Value Date   ALBUMIN 2.4 (L) 02/21/2018   ALBUMIN 3.2 (L) 02/08/2018   ALBUMIN 3.3 (L) 01/19/2018   PREALBUMIN 30.7 12/31/2015   PREALBUMIN 34.1  12/02/2015    Body mass index is 30.85 kg/m.  Orders:  No orders of the defined types were placed in this encounter.  No orders of the defined types were placed in this encounter.    Procedures: No procedures performed  Clinical Data: No additional findings.  ROS:  All other systems negative, except as noted in the HPI. Review of Systems  Objective: Vital Signs: Ht 5\' 7"  (1.702 m)   Wt 197 lb (89.4 kg)   BMI 30.85 kg/m   Specialty Comments:  No specialty comments available.  PMFS History: Patient Active Problem List   Diagnosis Date Noted  . Hypoxia   . Abscess of right foot 01/19/2018  . Depression   . PAD (peripheral artery disease) ([place])   . Midfoot skin ulcer, right, limited to breakdown of skin ([place])  08/14/2017  . Hypertension, uncontrolled 08/08/2017  . Cellulitis of foot, right 08/07/2017  . Bulging lumbar disc 07/04/2017  . Weakness of right lower extremity 05/19/2017  . Cellulitis 05/18/2017  . Achilles tendon contracture, left 01/14/2017  . Gingivitis 11/14/2016  . Achilles tendon contracture, right 09/17/2016  . Pain and swelling of left lower leg 09/16/2016  . CKD (chronic kidney disease) stage 3, GFR 30-59 ml/min (HCC) 08/27/2016  . Type 2 diabetes mellitus without complication, with long-term current use of insulin ([place]) 07/22/2016  . Constipation 07/02/2016  . S/P transmetatarsal amputation of foot, left ([place]) 07/02/2016  . Shortness of breath 07/02/2016  . Diabetic gastroparesis ([place]) 05/30/2016  . Buzzing in ear, right 03/26/2016  . Joint pain 03/11/2016  . Weakness 03/08/2016  . Dizziness 03/03/2016  . Chronic diastolic CHF (congestive heart failure), NYHA class 1 ([place]) 12/30/2015  . Diabetes mellitus type 2, uncontrolled, with complications ([place]) 64/33/2951  . Depression with anxiety 12/01/2015  . Pain in the chest 12/01/2015  . Insomnia 11/21/2015  . GERD (gastroesophageal reflux disease) 08/18/2015  . Erectile dysfunction 07/04/2015  . Symptomatic anemia 04/24/2015  . AKI (acute kidney injury) ([place]) 04/24/2015  . Left arm weakness 04/02/2015  . Sensory disturbance 04/02/2015  . Essential hypertension 04/02/2015  . Hemispheric carotid artery syndrome   . HLD (hyperlipidemia)   . Headache    Past Medical History:  Diagnosis Date  . Anemia   . Bipolar disorder ([place])   . Cellulitis 05/2017   RIGHT FOOT   DIABETIC ULCER   . Cellulitis and abscess of left leg 06/2016  . Chest pain    a. 2015 Reportedly normal stress test in [place].  Marland Kitchen Chronic diastolic CHF (congestive heart failure) (HCC)    a.03/2015 Echo: EF 55-60%, Gr 1 DD, mild MR, triv PR.  . Depression   . Depression with anxiety   . Dyspnea    with exertion  . GERD (gastroesophageal reflux disease)    . Glaucoma   . Hyperlipidemia   . Hypertension    a. 08/2014 Admitted with hypertensive urgency.  . Insomnia   . Internal carotid artery stenosis   . Lower GI bleed 07/04/2015  . Neuropathy   . Refusal of blood transfusions as patient is Jehovah's Witness   . TIA (transient ischemic attack) 08/2014; 03/2015   a. 08/2014 in setting of hypertensive urgency.  . Type II diabetes mellitus ([place])    started insulin spring 2016, Type II  . Vitamin D deficiency spring 2016    Family History  Problem Relation Age of Onset  . Hypertension Mother   . Diabetes Mother   . Hyperlipidemia Mother   .  Heart disease Mother        s/p pacemaker  . Diabetes Father   . Hypertension Father   . Stroke Father   . Heart attack Father        first MI @ 73.  . Depression Father   . Dementia Father   . Stroke Brother   . ADD / ADHD Brother   . Anxiety disorder Brother   . Bipolar disorder Brother   . OCD Brother   . Sexual abuse Brother     Past Surgical History:  Procedure Laterality Date  . AMPUTATION Left 08/03/2015   Procedure: AMPUTATION LEFT GREAT TOE;  Surgeon: Newt Minion, MD;  Location: [facility];  Service: Orthopedics;  Laterality: Left;  . AMPUTATION Left 12/02/2015   Procedure: amputation of left 2nd digit  . AMPUTATION Left 04/10/2016   Procedure: Left 2nd Toe Amputation at MTP Joint;  Surgeon: Newt Minion, MD;  Location: [facility];  Service: Orthopedics;  Laterality: Left;  . AMPUTATION Left 06/09/2016   Procedure: AMPUTATION LEFT THIRD TOE;  Surgeon: [name], MD;  Location: [facility];  Service: Orthopedics;  Laterality: Left;  . AMPUTATION Left 06/26/2016   Procedure: Left Foot Transmetatarsal Amputation;  Surgeon: Newt Minion, MD;  Location: [facility];  Service: Orthopedics;  Laterality: Left;  . APPLICATION OF WOUND VAC Left 12/19/2015   Procedure: APPLICATION OF WOUND VAC;  Surgeon: Meredith Pel, MD;  Location: [facility];  Service: Orthopedics;  Laterality: Left;  . CIRCUMCISION    .  COLONOSCOPY N/A 01/22/2016   Procedure: COLONOSCOPY;  Surgeon: Gatha Mayer, MD;  Location: [facility];  Service: Endoscopy;  Laterality: N/A;  . ESOPHAGOGASTRODUODENOSCOPY N/A 01/19/2016   Procedure: ESOPHAGOGASTRODUODENOSCOPY (EGD);  Surgeon: Manus Gunning, MD;  Location: [facility];  Service: Gastroenterology;  Laterality: N/A;  . ESOPHAGOGASTRODUODENOSCOPY N/A 01/22/2016   Procedure: ESOPHAGOGASTRODUODENOSCOPY (EGD);  Surgeon: Gatha Mayer, MD;  Location: [facility] ENDOSCOPY;  Service: Endoscopy;  Laterality: N/A;  . I&D EXTREMITY Left 12/02/2015   Procedure: IRRIGATION AND DEBRIDEMENT OF FOOT; LEFT SECOND TOE AMPUTATION;  Surgeon: Meredith Pel, MD;  Location: [facility];  Service: Orthopedics;  Laterality: Left;  . I&D EXTREMITY Left 12/19/2015   Procedure: I & D LEFT FOOT WITH BEADS ;  Surgeon: Meredith Pel, MD;  Location: [facility];  Service: Orthopedics;  Laterality: Left;  . I&D EXTREMITY Right 01/17/2016   Procedure: IRRIGATION AND DEBRIDEMENT RIGHT FOOT;  Surgeon: Newt Minion, MD;  Location: [facility];  Service: Orthopedics;  Laterality: Right;  . I&D EXTREMITY Right 01/21/2018   Procedure: IRRIGATION AND DEBRIDEMENT RIGHT FOOT;  Surgeon: Newt Minion, MD;  Location: [facility];  Service: Orthopedics;  Laterality: Right;  . INCISION AND DRAINAGE FOOT Right 01/17/2016  . INGUINAL HERNIA REPAIR Bilateral ~ 1983- ~ 1986  . TONSILLECTOMY  ~ 9   Social History   Occupational History  . Occupation: disabled  Tobacco Use  . Smoking status: Never Smoker  . Smokeless tobacco: Never Used  Substance and Sexual Activity  . Alcohol use: No    Alcohol/week: 0.0 standard drinks  . Drug use: No  . Sexual activity: Not Currently

## 2018-02-25 ENCOUNTER — Ambulatory Visit (INDEPENDENT_AMBULATORY_CARE_PROVIDER_SITE_OTHER): Payer: Self-pay | Admitting: Physician Assistant

## 2018-02-25 ENCOUNTER — Other Ambulatory Visit: Payer: Self-pay | Admitting: Family Medicine

## 2018-02-25 ENCOUNTER — Ambulatory Visit: Payer: Self-pay | Admitting: Family Medicine

## 2018-02-26 ENCOUNTER — Encounter (HOSPITAL_COMMUNITY): Payer: Self-pay | Admitting: *Deleted

## 2018-02-26 ENCOUNTER — Ambulatory Visit (INDEPENDENT_AMBULATORY_CARE_PROVIDER_SITE_OTHER): Payer: Medicare HMO | Admitting: Physician Assistant

## 2018-02-26 ENCOUNTER — Other Ambulatory Visit: Payer: Self-pay | Admitting: Family Medicine

## 2018-02-26 ENCOUNTER — Telehealth: Payer: Self-pay | Admitting: Family Medicine

## 2018-02-26 ENCOUNTER — Other Ambulatory Visit: Payer: Self-pay

## 2018-02-26 DIAGNOSIS — IMO0002 Reserved for concepts with insufficient information to code with codable children: Secondary | ICD-10-CM

## 2018-02-26 DIAGNOSIS — E1165 Type 2 diabetes mellitus with hyperglycemia: Secondary | ICD-10-CM

## 2018-02-26 DIAGNOSIS — E118 Type 2 diabetes mellitus with unspecified complications: Principal | ICD-10-CM

## 2018-02-26 MED ORDER — TAMSULOSIN HCL 0.4 MG PO CAPS: 0.4000 mg | ORAL_CAPSULE | Freq: Every day | ORAL | 0 refills | Status: AC

## 2018-02-26 MED ORDER — LISINOPRIL 10 MG PO TABS: 10.0000 mg | ORAL_TABLET | Freq: Every day | ORAL | 0 refills | Status: AC

## 2018-02-26 NOTE — Telephone Encounter (Signed)
1) Medication(s) Requested (by name): test strips   2) Pharmacy of Choice: [facility] on [address]    3) Special Requests:   Approved medications will be sent to the pharmacy, we will reach out if there is an issue.  Requests made after 3pm may not be addressed until the following business day!  If a patient is unsure of the name of the medication(s) please note and ask patient to call back when they are able to provide all info, do not send to responsible party until all information is available!

## 2018-02-26 NOTE — Progress Notes (Signed)
Spoke with pt for pre-op call. Pt denies cardiac history, states he's had 2 minor strokes in the past with some lingering left sided weakness. Pt is a type 2 diabetic. Last A1C was 7.3 on 02/20/18. Pt states his fasting blood sugar is usually between 89-109. Instructed pt to take 1/2 of his regular dose of Lantus this evening. Instructed pt to check his blood sugar when he gets up in the AM and every 2 hours until he leaves for the hospital. If blood sugar is 70 or below, treat with 1/2 cup of clear juice (apple or cranberry) and recheck blood sugar 15 minutes after drinking juice. If blood sugar continues to be 70 or below, call the Short Stay department and ask to speak to a nurse. Pt voiced understanding Pt is concerned that this surgery is scheduled as him going home after surgery. He states that he usually has issues with blood loss and having chest pain after surgery. His wife is also not going to be able to be with him the 24 hours after surgery at home. I instructed pt to let Dr. Sharol Given know this in the morning prior to surgery. Pt voiced understanding.

## 2018-02-27 ENCOUNTER — Encounter (HOSPITAL_COMMUNITY): Payer: Self-pay

## 2018-02-27 ENCOUNTER — Encounter (HOSPITAL_COMMUNITY)
Admission: RE | Disposition: A | Discharge status: Home / Self Care | Payer: Self-pay | Source: Ambulatory Visit | Attending: Orthopedic Surgery

## 2018-02-27 ENCOUNTER — Other Ambulatory Visit: Payer: Self-pay

## 2018-02-27 ENCOUNTER — Ambulatory Visit (HOSPITAL_BASED_OUTPATIENT_CLINIC_OR_DEPARTMENT_OTHER)
Admission: RE | Admit: 2018-02-27 | Discharge: 2018-03-01 | Disposition: A | Discharge status: Home / Self Care | Payer: Medicare HMO | Source: Ambulatory Visit | Attending: Orthopedic Surgery | Admitting: Orthopedic Surgery

## 2018-02-27 ENCOUNTER — Ambulatory Visit (HOSPITAL_COMMUNITY): Payer: Medicare HMO | Admitting: Certified Registered"

## 2018-02-27 DIAGNOSIS — E11621 Type 2 diabetes mellitus with foot ulcer: Secondary | ICD-10-CM

## 2018-02-27 DIAGNOSIS — L97419 Non-pressure chronic ulcer of right heel and midfoot with unspecified severity: Secondary | ICD-10-CM | POA: Diagnosis present

## 2018-02-27 DIAGNOSIS — L97413 Non-pressure chronic ulcer of right heel and midfoot with necrosis of muscle: Secondary | ICD-10-CM | POA: Insufficient documentation

## 2018-02-27 DIAGNOSIS — I110 Hypertensive heart disease with heart failure: Secondary | ICD-10-CM

## 2018-02-27 DIAGNOSIS — E785 Hyperlipidemia, unspecified: Secondary | ICD-10-CM

## 2018-02-27 DIAGNOSIS — E1140 Type 2 diabetes mellitus with diabetic neuropathy, unspecified: Secondary | ICD-10-CM

## 2018-02-27 DIAGNOSIS — R000 Tachycardia, unspecified: Secondary | ICD-10-CM | POA: Diagnosis not present

## 2018-02-27 DIAGNOSIS — K219 Gastro-esophageal reflux disease without esophagitis: Secondary | ICD-10-CM | POA: Diagnosis present

## 2018-02-27 DIAGNOSIS — E119 Type 2 diabetes mellitus without complications: Secondary | ICD-10-CM | POA: Diagnosis not present

## 2018-02-27 DIAGNOSIS — R1010 Upper abdominal pain, unspecified: Secondary | ICD-10-CM | POA: Diagnosis not present

## 2018-02-27 DIAGNOSIS — Z89411 Acquired absence of right great toe: Secondary | ICD-10-CM | POA: Diagnosis present

## 2018-02-27 DIAGNOSIS — K559 Vascular disorder of intestine, unspecified: Secondary | ICD-10-CM | POA: Diagnosis present

## 2018-02-27 DIAGNOSIS — Z23 Encounter for immunization: Secondary | ICD-10-CM

## 2018-02-27 DIAGNOSIS — E1122 Type 2 diabetes mellitus with diabetic chronic kidney disease: Secondary | ICD-10-CM | POA: Diagnosis present

## 2018-02-27 DIAGNOSIS — I10 Essential (primary) hypertension: Secondary | ICD-10-CM | POA: Diagnosis not present

## 2018-02-27 DIAGNOSIS — K2970 Gastritis, unspecified, without bleeding: Secondary | ICD-10-CM | POA: Diagnosis not present

## 2018-02-27 DIAGNOSIS — K9189 Other postprocedural complications and disorders of digestive system: Secondary | ICD-10-CM | POA: Diagnosis present

## 2018-02-27 DIAGNOSIS — E1151 Type 2 diabetes mellitus with diabetic peripheral angiopathy without gangrene: Secondary | ICD-10-CM | POA: Diagnosis present

## 2018-02-27 DIAGNOSIS — F418 Other specified anxiety disorders: Secondary | ICD-10-CM | POA: Insufficient documentation

## 2018-02-27 DIAGNOSIS — I69354 Hemiplegia and hemiparesis following cerebral infarction affecting left non-dominant side: Secondary | ICD-10-CM

## 2018-02-27 DIAGNOSIS — I70209 Unspecified atherosclerosis of native arteries of extremities, unspecified extremity: Secondary | ICD-10-CM | POA: Diagnosis present

## 2018-02-27 DIAGNOSIS — E739 Lactose intolerance, unspecified: Secondary | ICD-10-CM | POA: Diagnosis present

## 2018-02-27 DIAGNOSIS — K3184 Gastroparesis: Secondary | ICD-10-CM | POA: Diagnosis present

## 2018-02-27 DIAGNOSIS — I130 Hypertensive heart and chronic kidney disease with heart failure and stage 1 through stage 4 chronic kidney disease, or unspecified chronic kidney disease: Secondary | ICD-10-CM | POA: Diagnosis present

## 2018-02-27 DIAGNOSIS — Z79899 Other long term (current) drug therapy: Secondary | ICD-10-CM

## 2018-02-27 DIAGNOSIS — R109 Unspecified abdominal pain: Secondary | ICD-10-CM | POA: Diagnosis present

## 2018-02-27 DIAGNOSIS — K3189 Other diseases of stomach and duodenum: Secondary | ICD-10-CM | POA: Diagnosis not present

## 2018-02-27 DIAGNOSIS — E1143 Type 2 diabetes mellitus with diabetic autonomic (poly)neuropathy: Secondary | ICD-10-CM | POA: Diagnosis present

## 2018-02-27 DIAGNOSIS — F319 Bipolar disorder, unspecified: Secondary | ICD-10-CM | POA: Diagnosis present

## 2018-02-27 DIAGNOSIS — I700 Atherosclerosis of aorta: Secondary | ICD-10-CM | POA: Diagnosis present

## 2018-02-27 DIAGNOSIS — Z7982 Long term (current) use of aspirin: Secondary | ICD-10-CM | POA: Insufficient documentation

## 2018-02-27 DIAGNOSIS — M86271 Subacute osteomyelitis, right ankle and foot: Secondary | ICD-10-CM | POA: Diagnosis present

## 2018-02-27 DIAGNOSIS — M869 Osteomyelitis, unspecified: Secondary | ICD-10-CM | POA: Diagnosis not present

## 2018-02-27 DIAGNOSIS — J45909 Unspecified asthma, uncomplicated: Secondary | ICD-10-CM | POA: Diagnosis present

## 2018-02-27 DIAGNOSIS — E118 Type 2 diabetes mellitus with unspecified complications: Secondary | ICD-10-CM | POA: Diagnosis not present

## 2018-02-27 DIAGNOSIS — Z794 Long term (current) use of insulin: Secondary | ICD-10-CM

## 2018-02-27 DIAGNOSIS — A419 Sepsis, unspecified organism: Secondary | ICD-10-CM | POA: Diagnosis present

## 2018-02-27 DIAGNOSIS — K319 Disease of stomach and duodenum, unspecified: Secondary | ICD-10-CM | POA: Diagnosis not present

## 2018-02-27 DIAGNOSIS — N183 Chronic kidney disease, stage 3 (moderate): Secondary | ICD-10-CM | POA: Diagnosis not present

## 2018-02-27 DIAGNOSIS — J984 Other disorders of lung: Secondary | ICD-10-CM | POA: Diagnosis not present

## 2018-02-27 DIAGNOSIS — I5032 Chronic diastolic (congestive) heart failure: Secondary | ICD-10-CM | POA: Diagnosis present

## 2018-02-27 DIAGNOSIS — M86171 Other acute osteomyelitis, right ankle and foot: Secondary | ICD-10-CM

## 2018-02-27 DIAGNOSIS — K253 Acute gastric ulcer without hemorrhage or perforation: Secondary | ICD-10-CM | POA: Diagnosis not present

## 2018-02-27 DIAGNOSIS — R1013 Epigastric pain: Secondary | ICD-10-CM | POA: Diagnosis not present

## 2018-02-27 DIAGNOSIS — E1169 Type 2 diabetes mellitus with other specified complication: Secondary | ICD-10-CM | POA: Diagnosis present

## 2018-02-27 DIAGNOSIS — E1165 Type 2 diabetes mellitus with hyperglycemia: Secondary | ICD-10-CM | POA: Diagnosis present

## 2018-02-27 DIAGNOSIS — K2900 Acute gastritis without bleeding: Secondary | ICD-10-CM | POA: Diagnosis present

## 2018-02-27 DIAGNOSIS — R933 Abnormal findings on diagnostic imaging of other parts of digestive tract: Secondary | ICD-10-CM | POA: Diagnosis not present

## 2018-02-27 DIAGNOSIS — H409 Unspecified glaucoma: Secondary | ICD-10-CM | POA: Diagnosis present

## 2018-02-27 DIAGNOSIS — K259 Gastric ulcer, unspecified as acute or chronic, without hemorrhage or perforation: Secondary | ICD-10-CM | POA: Diagnosis not present

## 2018-02-27 HISTORY — DX: Unspecified asthma, uncomplicated: J45.909

## 2018-02-27 HISTORY — PX: AMPUTATION: SHX166

## 2018-02-27 LAB — GLUCOSE, CAPILLARY
GLUCOSE-CAPILLARY: 107 mg/dL — AB (ref 70–99)
GLUCOSE-CAPILLARY: 279 mg/dL — AB (ref 70–99)
GLUCOSE-CAPILLARY: 232 mg/dL — AB (ref 70–99)
Glucose-Capillary: 101 mg/dL — ABNORMAL HIGH (ref 70–99)

## 2018-02-27 SURGERY — AMPUTATION, FOOT, RAY
Anesthesia: General | Site: Foot | Laterality: Right

## 2018-02-27 MED ORDER — 0.9 % SODIUM CHLORIDE (POUR BTL) OPTIME
TOPICAL | Status: DC | PRN
  Administered 2018-02-27: 1000 mL

## 2018-02-27 MED ORDER — ACCU-CHEK FASTCLIX LANCETS MISC: 12 refills | Status: AC

## 2018-02-27 MED ORDER — MIDAZOLAM HCL 5 MG/5ML IJ SOLN
INTRAMUSCULAR | Status: DC | PRN
  Administered 2018-02-27: 2 mg via INTRAVENOUS

## 2018-02-27 MED ORDER — FLUOXETINE HCL 20 MG PO CAPS
40.0000 mg | ORAL_CAPSULE | Freq: Every day | ORAL | Status: DC
  Administered 2018-02-27 – 2018-03-01 (×3): 40 mg via ORAL
  Filled 2018-02-27 (×3): qty 2

## 2018-02-27 MED ORDER — ONDANSETRON HCL 4 MG/2ML IJ SOLN
INTRAMUSCULAR | Status: DC | PRN
  Administered 2018-02-27: 4 mg via INTRAVENOUS

## 2018-02-27 MED ORDER — LACTATED RINGERS IV SOLN
INTRAVENOUS | Status: DC
  Administered 2018-02-27 (×2): via INTRAVENOUS

## 2018-02-27 MED ORDER — OXYCODONE HCL 5 MG PO TABS: 5.0000 mg | ORAL_TABLET | Freq: Once | ORAL | Status: DC | PRN

## 2018-02-27 MED ORDER — PANTOPRAZOLE SODIUM 40 MG PO TBEC
40.0000 mg | DELAYED_RELEASE_TABLET | Freq: Every day | ORAL | Status: DC
  Administered 2018-02-27 – 2018-03-01 (×3): 40 mg via ORAL
  Filled 2018-02-27 (×3): qty 1

## 2018-02-27 MED ORDER — LISINOPRIL 10 MG PO TABS
10.0000 mg | ORAL_TABLET | Freq: Every day | ORAL | Status: DC
  Administered 2018-02-27 – 2018-03-01 (×2): 10 mg via ORAL
  Filled 2018-02-27 (×3): qty 1

## 2018-02-27 MED ORDER — PROPOFOL 10 MG/ML IV BOLUS
INTRAVENOUS | Status: DC | PRN
  Administered 2018-02-27: 150 mg via INTRAVENOUS

## 2018-02-27 MED ORDER — HYDROMORPHONE HCL 1 MG/ML IJ SOLN
1.0000 mg | INTRAMUSCULAR | Status: DC | PRN
  Administered 2018-02-27: 1 mg via INTRAVENOUS
  Filled 2018-02-27: qty 1

## 2018-02-27 MED ORDER — MAGNESIUM CITRATE PO SOLN: 1.0000 | Freq: Once | ORAL | Status: DC | PRN

## 2018-02-27 MED ORDER — PROPOFOL 10 MG/ML IV BOLUS
INTRAVENOUS | Status: AC
  Filled 2018-02-27: qty 20

## 2018-02-27 MED ORDER — ATORVASTATIN CALCIUM 80 MG PO TABS
80.0000 mg | ORAL_TABLET | Freq: Every morning | ORAL | Status: DC
  Administered 2018-02-27 – 2018-03-01 (×3): 80 mg via ORAL
  Filled 2018-02-27 (×3): qty 1

## 2018-02-27 MED ORDER — INSULIN ASPART 100 UNIT/ML ~~LOC~~ SOLN
4.0000 [IU] | Freq: Three times a day (TID) | SUBCUTANEOUS | Status: DC
  Administered 2018-02-27 – 2018-03-01 (×5): 4 [IU] via SUBCUTANEOUS

## 2018-02-27 MED ORDER — CARVEDILOL 25 MG PO TABS: 25.0000 mg | ORAL_TABLET | Freq: Two times a day (BID) | ORAL | Status: DC

## 2018-02-27 MED ORDER — HYDROCODONE-ACETAMINOPHEN 5-325 MG PO TABS: 1.0000 | ORAL_TABLET | ORAL | 0 refills | Status: AC | PRN

## 2018-02-27 MED ORDER — EPHEDRINE 5 MG/ML INJ
INTRAVENOUS | Status: AC
  Filled 2018-02-27: qty 10

## 2018-02-27 MED ORDER — CARVEDILOL 12.5 MG PO TABS
ORAL_TABLET | ORAL | Status: AC
  Filled 2018-02-27: qty 2

## 2018-02-27 MED ORDER — INSULIN GLARGINE 100 UNIT/ML ~~LOC~~ SOLN
30.0000 [IU] | Freq: Every day | SUBCUTANEOUS | Status: DC
  Administered 2018-02-27 – 2018-02-28 (×2): 30 [IU] via SUBCUTANEOUS
  Filled 2018-02-27 (×2): qty 0.3

## 2018-02-27 MED ORDER — MIDAZOLAM HCL 2 MG/2ML IJ SOLN
INTRAMUSCULAR | Status: AC
  Filled 2018-02-27: qty 2

## 2018-02-27 MED ORDER — POLYETHYLENE GLYCOL 3350 17 G PO PACK: 17.0000 g | PACK | Freq: Every day | ORAL | Status: DC | PRN

## 2018-02-27 MED ORDER — FENTANYL CITRATE (PF) 100 MCG/2ML IJ SOLN
INTRAMUSCULAR | Status: DC | PRN
  Administered 2018-02-27 (×2): 25 ug via INTRAVENOUS

## 2018-02-27 MED ORDER — CEFAZOLIN SODIUM-DEXTROSE 1-4 GM/50ML-% IV SOLN
1.0000 g | Freq: Four times a day (QID) | INTRAVENOUS | Status: AC
  Administered 2018-02-27 – 2018-02-28 (×2): 1 g via INTRAVENOUS
  Filled 2018-02-27 (×2): qty 50

## 2018-02-27 MED ORDER — LIDOCAINE 2% (20 MG/ML) 5 ML SYRINGE
INTRAMUSCULAR | Status: DC | PRN
  Administered 2018-02-27: 60 mg via INTRAVENOUS

## 2018-02-27 MED ORDER — PHENYLEPHRINE 40 MCG/ML (10ML) SYRINGE FOR IV PUSH (FOR BLOOD PRESSURE SUPPORT)
PREFILLED_SYRINGE | INTRAVENOUS | Status: AC
  Filled 2018-02-27: qty 10

## 2018-02-27 MED ORDER — GABAPENTIN 300 MG PO CAPS
300.0000 mg | ORAL_CAPSULE | Freq: Two times a day (BID) | ORAL | Status: DC
  Administered 2018-02-27 – 2018-03-01 (×5): 300 mg via ORAL
  Filled 2018-02-27 (×5): qty 1

## 2018-02-27 MED ORDER — DOCUSATE SODIUM 100 MG PO CAPS
100.0000 mg | ORAL_CAPSULE | Freq: Two times a day (BID) | ORAL | Status: DC
  Administered 2018-02-27 – 2018-02-28 (×4): 100 mg via ORAL
  Filled 2018-02-27 (×5): qty 1

## 2018-02-27 MED ORDER — CARVEDILOL 25 MG PO TABS
25.0000 mg | ORAL_TABLET | Freq: Once | ORAL | Status: AC
  Administered 2018-02-27: 25 mg via ORAL
  Filled 2018-02-27: qty 1

## 2018-02-27 MED ORDER — CEFAZOLIN SODIUM-DEXTROSE 2-4 GM/100ML-% IV SOLN
2.0000 g | INTRAVENOUS | Status: AC
  Administered 2018-02-27: 2 g via INTRAVENOUS
  Filled 2018-02-27: qty 100

## 2018-02-27 MED ORDER — CHLORHEXIDINE GLUCONATE 4 % EX LIQD: 60.0000 mL | Freq: Once | CUTANEOUS | Status: DC

## 2018-02-27 MED ORDER — CARBAMAZEPINE 200 MG PO TABS
200.0000 mg | ORAL_TABLET | Freq: Every day | ORAL | Status: DC
  Administered 2018-02-27 – 2018-02-28 (×2): 200 mg via ORAL
  Filled 2018-02-27 (×2): qty 1

## 2018-02-27 MED ORDER — METOCLOPRAMIDE HCL 5 MG/ML IJ SOLN
5.0000 mg | Freq: Three times a day (TID) | INTRAMUSCULAR | Status: DC | PRN
  Administered 2018-02-28: 10 mg via INTRAVENOUS
  Filled 2018-02-27: qty 2

## 2018-02-27 MED ORDER — METHOCARBAMOL 1000 MG/10ML IJ SOLN
500.0000 mg | Freq: Four times a day (QID) | INTRAVENOUS | Status: DC | PRN
  Filled 2018-02-27: qty 5

## 2018-02-27 MED ORDER — SODIUM CHLORIDE 0.9 % IV SOLN: INTRAVENOUS | Status: DC

## 2018-02-27 MED ORDER — AMLODIPINE BESYLATE 5 MG PO TABS
5.0000 mg | ORAL_TABLET | Freq: Every day | ORAL | Status: DC
  Administered 2018-02-27 – 2018-03-01 (×3): 5 mg via ORAL
  Filled 2018-02-27 (×3): qty 1

## 2018-02-27 MED ORDER — LIDOCAINE 2% (20 MG/ML) 5 ML SYRINGE
INTRAMUSCULAR | Status: AC
  Filled 2018-02-27: qty 5

## 2018-02-27 MED ORDER — ONDANSETRON HCL 4 MG/2ML IJ SOLN
4.0000 mg | Freq: Four times a day (QID) | INTRAMUSCULAR | Status: DC | PRN
  Administered 2018-02-28 (×2): 4 mg via INTRAVENOUS
  Filled 2018-02-27 (×2): qty 2

## 2018-02-27 MED ORDER — HYDROMORPHONE HCL 1 MG/ML IJ SOLN
0.2500 mg | INTRAMUSCULAR | Status: DC | PRN
  Administered 2018-02-27 (×2): 0.5 mg via INTRAVENOUS

## 2018-02-27 MED ORDER — CARVEDILOL 25 MG PO TABS
25.0000 mg | ORAL_TABLET | Freq: Every day | ORAL | Status: DC
  Administered 2018-02-27 – 2018-03-01 (×2): 25 mg via ORAL
  Filled 2018-02-27 (×3): qty 1

## 2018-02-27 MED ORDER — TAMSULOSIN HCL 0.4 MG PO CAPS
0.4000 mg | ORAL_CAPSULE | Freq: Every day | ORAL | Status: DC
  Administered 2018-02-27 – 2018-03-01 (×3): 0.4 mg via ORAL
  Filled 2018-02-27 (×3): qty 1

## 2018-02-27 MED ORDER — TRAZODONE HCL 100 MG PO TABS
100.0000 mg | ORAL_TABLET | Freq: Every day | ORAL | Status: DC
  Administered 2018-02-27 – 2018-02-28 (×2): 100 mg via ORAL
  Filled 2018-02-27 (×2): qty 1

## 2018-02-27 MED ORDER — GLYCOPYRROLATE 0.2 MG/ML IJ SOLN
INTRAMUSCULAR | Status: DC | PRN
  Administered 2018-02-27 (×2): 0.1 mg via INTRAVENOUS

## 2018-02-27 MED ORDER — FENTANYL CITRATE (PF) 250 MCG/5ML IJ SOLN
INTRAMUSCULAR | Status: AC
  Filled 2018-02-27: qty 5

## 2018-02-27 MED ORDER — HYDROCODONE-ACETAMINOPHEN 5-325 MG PO TABS
1.0000 | ORAL_TABLET | Freq: Four times a day (QID) | ORAL | Status: DC | PRN
  Administered 2018-02-27 – 2018-02-28 (×2): 2 via ORAL
  Filled 2018-02-27 (×3): qty 2

## 2018-02-27 MED ORDER — GLYCOPYRROLATE PF 0.2 MG/ML IJ SOSY
PREFILLED_SYRINGE | INTRAMUSCULAR | Status: AC
  Filled 2018-02-27: qty 1

## 2018-02-27 MED ORDER — DEXAMETHASONE SODIUM PHOSPHATE 10 MG/ML IJ SOLN
INTRAMUSCULAR | Status: DC | PRN
  Administered 2018-02-27: 10 mg via INTRAVENOUS

## 2018-02-27 MED ORDER — CEFAZOLIN SODIUM 1 G IJ SOLR
INTRAMUSCULAR | Status: AC
  Filled 2018-02-27: qty 20

## 2018-02-27 MED ORDER — PROMETHAZINE HCL 25 MG/ML IJ SOLN: 6.2500 mg | INTRAMUSCULAR | Status: DC | PRN

## 2018-02-27 MED ORDER — GLIPIZIDE ER 10 MG PO TB24
10.0000 mg | ORAL_TABLET | Freq: Every day | ORAL | Status: DC
  Administered 2018-02-28 – 2018-03-01 (×2): 10 mg via ORAL
  Filled 2018-02-27 (×2): qty 1

## 2018-02-27 MED ORDER — EPHEDRINE SULFATE-NACL 50-0.9 MG/10ML-% IV SOSY
PREFILLED_SYRINGE | INTRAVENOUS | Status: DC | PRN
  Administered 2018-02-27 (×4): 10 mg via INTRAVENOUS

## 2018-02-27 MED ORDER — ACCU-CHEK AVIVA PLUS W/DEVICE KIT: PACK | 0 refills | Status: AC

## 2018-02-27 MED ORDER — INSULIN ASPART 100 UNIT/ML ~~LOC~~ SOLN
0.0000 [IU] | Freq: Three times a day (TID) | SUBCUTANEOUS | Status: DC
  Administered 2018-02-27 – 2018-02-28 (×2): 5 [IU] via SUBCUTANEOUS
  Administered 2018-02-28: 11 [IU] via SUBCUTANEOUS
  Administered 2018-02-28: 3 [IU] via SUBCUTANEOUS
  Administered 2018-03-01: 5 [IU] via SUBCUTANEOUS
  Administered 2018-03-01: 3 [IU] via SUBCUTANEOUS

## 2018-02-27 MED ORDER — OXYCODONE HCL 5 MG/5ML PO SOLN: 5.0000 mg | Freq: Once | ORAL | Status: DC | PRN

## 2018-02-27 MED ORDER — ASPIRIN EC 81 MG PO TBEC
81.0000 mg | DELAYED_RELEASE_TABLET | Freq: Every day | ORAL | Status: DC
  Administered 2018-02-27 – 2018-03-01 (×3): 81 mg via ORAL
  Filled 2018-02-27 (×3): qty 1

## 2018-02-27 MED ORDER — PHENYLEPHRINE 40 MCG/ML (10ML) SYRINGE FOR IV PUSH (FOR BLOOD PRESSURE SUPPORT)
PREFILLED_SYRINGE | INTRAVENOUS | Status: DC | PRN
  Administered 2018-02-27 (×5): 80 ug via INTRAVENOUS

## 2018-02-27 MED ORDER — BISACODYL 10 MG RE SUPP: 10.0000 mg | Freq: Every day | RECTAL | Status: DC | PRN

## 2018-02-27 MED ORDER — CEFAZOLIN SODIUM-DEXTROSE 1-4 GM/50ML-% IV SOLN
1.0000 g | Freq: Four times a day (QID) | INTRAVENOUS | Status: DC
  Administered 2018-02-27: 1 g via INTRAVENOUS
  Filled 2018-02-27 (×2): qty 50

## 2018-02-27 MED ORDER — METOCLOPRAMIDE HCL 5 MG PO TABS: 5.0000 mg | ORAL_TABLET | Freq: Three times a day (TID) | ORAL | Status: DC | PRN

## 2018-02-27 MED ORDER — HYDROMORPHONE HCL 1 MG/ML IJ SOLN
INTRAMUSCULAR | Status: AC
  Filled 2018-02-27: qty 1

## 2018-02-27 MED ORDER — METHOCARBAMOL 500 MG PO TABS: 500.0000 mg | ORAL_TABLET | Freq: Four times a day (QID) | ORAL | Status: DC | PRN

## 2018-02-27 MED ORDER — ONDANSETRON HCL 4 MG PO TABS
4.0000 mg | ORAL_TABLET | Freq: Four times a day (QID) | ORAL | Status: DC | PRN
  Administered 2018-03-01: 4 mg via ORAL
  Filled 2018-02-27: qty 1

## 2018-02-27 MED ORDER — GLUCOSE BLOOD VI STRP: ORAL_STRIP | 12 refills | Status: AC

## 2018-02-27 SURGICAL SUPPLY — 31 items
BLADE SURG 21 STRL SS (BLADE) ×3 IMPLANT
BNDG COHESIVE 4X5 TAN STRL (GAUZE/BANDAGES/DRESSINGS) ×3 IMPLANT
BNDG COHESIVE 6X5 TAN STRL LF (GAUZE/BANDAGES/DRESSINGS) ×3 IMPLANT
BNDG GAUZE ELAST 4 BULKY (GAUZE/BANDAGES/DRESSINGS) ×3 IMPLANT
COVER SURGICAL LIGHT HANDLE (MISCELLANEOUS) ×3 IMPLANT
DRAPE U-SHAPE 47X51 STRL (DRAPES) ×6 IMPLANT
DRSG ADAPTIC 3X8 NADH LF (GAUZE/BANDAGES/DRESSINGS) ×3 IMPLANT
DRSG PAD ABDOMINAL 8X10 ST (GAUZE/BANDAGES/DRESSINGS) ×3 IMPLANT
DURAPREP 26ML APPLICATOR (WOUND CARE) ×3 IMPLANT
ELECT REM PT RETURN 9FT ADLT (ELECTROSURGICAL) ×3
ELECTRODE REM PT RTRN 9FT ADLT (ELECTROSURGICAL) ×1 IMPLANT
GAUZE SPONGE 4X4 12PLY STRL (GAUZE/BANDAGES/DRESSINGS) ×3 IMPLANT
GLOVE INDICATOR 8.5 STRL (GLOVE) ×3 IMPLANT
GLOVE SURG ORTHO 8.5 STRL (GLOVE) ×3 IMPLANT
GOWN STRL REUS W/ TWL XL LVL3 (GOWN DISPOSABLE) ×2 IMPLANT
GOWN STRL REUS W/TWL XL LVL3 (GOWN DISPOSABLE) ×4
KIT BASIN OR (CUSTOM PROCEDURE TRAY) ×3 IMPLANT
KIT TURNOVER KIT B (KITS) ×3 IMPLANT
NS IRRIG 1000ML POUR BTL (IV SOLUTION) ×3 IMPLANT
PACK ORTHO EXTREMITY (CUSTOM PROCEDURE TRAY) ×3 IMPLANT
PAD ABD 8X10 STRL (GAUZE/BANDAGES/DRESSINGS) ×3 IMPLANT
PAD ARMBOARD 7.5X6 YLW CONV (MISCELLANEOUS) ×3 IMPLANT
SPONGE LAP 18X18 X RAY DECT (DISPOSABLE) ×3 IMPLANT
STOCKINETTE IMPERVIOUS LG (DRAPES) IMPLANT
SUCTION FRAZIER HANDLE 10FR (MISCELLANEOUS) ×2
SUCTION TUBE FRAZIER 10FR DISP (MISCELLANEOUS) ×1 IMPLANT
SUT ETHILON 2 0 PSLX (SUTURE) ×3 IMPLANT
TOWEL GREEN STERILE (TOWEL DISPOSABLE) ×3 IMPLANT
TUBE CONNECTING 12'X1/4 (SUCTIONS) ×1
TUBE CONNECTING 12X1/4 (SUCTIONS) ×2 IMPLANT
YANKAUER SUCT BULB TIP NO VENT (SUCTIONS) ×3 IMPLANT

## 2018-02-27 NOTE — Progress Notes (Signed)
Orthopedic Tech Progress Note Patient Details:  Roxie Gueye Feb 25, 1971 [id number]  Ortho Devices Type of Ortho Device: Postop shoe/boot Ortho Device/Splint Interventions: Application   Post Interventions Patient Tolerated: Well Instructions Provided: Care of device   [place] Pink 02/27/2018, 6:26 PM

## 2018-02-27 NOTE — Anesthesia Postprocedure Evaluation (Signed)
Anesthesia Post Note  Patient: Shane [name]  Procedure(s) Performed: RIGHT FOOT 1ST RAY AMPUTATION (Right Foot)     Patient location during evaluation: PACU Anesthesia Type: General Level of consciousness: awake and alert Pain management: pain level controlled Vital Signs Assessment: post-procedure vital signs reviewed and stable Respiratory status: spontaneous breathing, nonlabored ventilation and respiratory function stable Cardiovascular status: blood pressure returned to baseline and stable Postop Assessment: no apparent nausea or vomiting Anesthetic complications: no    Last Vitals:  Vitals:   02/27/18 1200 02/27/18 1215  BP: (!) 148/90 (!) 140/96  Pulse:    Resp:    Temp:    SpO2:      Last Pain:  Vitals:   02/27/18 1200  TempSrc:   PainSc: [place]

## 2018-02-27 NOTE — Care Management Note (Addendum)
Case Management Note  Patient Details  Name: Shane [name] MRN: [id number] Date of Birth: 1970-11-10  Subjective/Objective:   Right Foot 1st Ray Amputation                Action/Plan: NCM spoke to pt and states he is active with [facility] for [facility]. They do his dressing changes. Sent message to attending for [facility] orders with F2F with new instructions. Pt states he has Rollator and bedside commode at home. Contacted [facility] for resumption of care. Pt declines HHPT at this time.    Expected Discharge Date:  02/28/18               Expected Discharge Plan:  [place]  In-House Referral:  NA  Discharge planning Services  CM Consult  Post Acute Care Choice:  Home Health, Resumption of Svcs/PTA Provider Choice offered to:  Patient  DME Arranged:  N/A DME Agency:  NA  HH Arranged:  RN [place] Agency:  [facility]  Status of Service:  Completed, signed off  If discussed at [place] of Stay Meetings, dates discussed:    Additional Comments:  Erenest Rasher, RN 02/27/2018, 2:47 PM

## 2018-02-27 NOTE — Transfer of Care (Signed)
Immediate Anesthesia Transfer of Care Note  Patient: Shane [name]  Procedure(s) Performed: RIGHT FOOT 1ST RAY AMPUTATION (Right Foot)  Patient Location: PACU  Anesthesia Type:General  Level of Consciousness: drowsy  Airway & Oxygen Therapy: Patient Spontanous Breathing and Patient connected to face mask oxygen  Post-op Assessment: Report given to RN and Post -op Vital signs reviewed and stable  Post vital signs: Reviewed and stable  Last Vitals:  Vitals Value Taken Time  BP 114/76 02/27/2018 11:03 AM  Temp    Pulse 85 02/27/2018 11:04 AM  Resp 9 02/27/2018 11:04 AM  SpO2 100 % 02/27/2018 11:04 AM  Vitals shown include unvalidated device data.  Last Pain:  Vitals:   02/27/18 0949  TempSrc:   PainSc: 6       Patients Stated Pain Goal: 0 (24/19/91 4445)  Complications: No apparent anesthesia complications

## 2018-02-27 NOTE — Anesthesia Procedure Notes (Signed)
Procedure Name: LMA Insertion Date/Time: 02/27/2018 10:25 AM Performed by: Gwyndolyn Saxon, CRNA Pre-anesthesia Checklist: Patient identified, Emergency Drugs available, Suction available and Patient being monitored Patient Re-evaluated:Patient Re-evaluated prior to induction Oxygen Delivery Method: Circle System Utilized Preoxygenation: Pre-oxygenation with 100% oxygen Induction Type: IV induction Ventilation: Mask ventilation without difficulty LMA: LMA inserted LMA Size: 4.0 Number of attempts: 1 Placement Confirmation: positive ETCO2 Tube secured with: Tape Dental Injury: Teeth and Oropharynx as per pre-operative assessment

## 2018-02-27 NOTE — H&P (Signed)
Shane [name] is an 48 y.o. male.   Chief Complaint: New ulceration right great toe. HPI: Patient is a 47 year old gentleman who is status post debridement of a midfoot ulcer dorsally to the right foot.  Patient has had multiple hospital admissions recently with complaints of ulceration to the right great toe.  Patient states that he has probed this with a sharp needle to see if he can drain the infection  Past Medical History:  Diagnosis Date  . Anemia   . Asthma    as a child  . Bipolar disorder ([place])    pt denies this   . Cellulitis 05/2017   RIGHT FOOT   DIABETIC ULCER   . Cellulitis and abscess of left leg 06/2016  . Chest pain    a. 2015 Reportedly normal stress test in [place].  Marland Kitchen Chronic diastolic CHF (congestive heart failure) (HCC)    a.03/2015 Echo: EF 55-60%, Gr 1 DD, mild MR, triv PR.  . Depression   . Depression with anxiety   . Dyspnea    with exertion  . GERD (gastroesophageal reflux disease)   . Glaucoma   . Hyperlipidemia   . Hypertension    a. 08/2014 Admitted with hypertensive urgency.  . Insomnia   . Internal carotid artery stenosis   . Lower GI bleed 07/04/2015  . Neuropathy   . Refusal of blood transfusions as patient is Jehovah's Witness   . Stroke [facility])    2 minor strokes - has slight weakness on left side  . TIA (transient ischemic attack) 08/2014; 03/2015   a. 08/2014 in setting of hypertensive urgency.  . Type II diabetes mellitus ([place])    started insulin spring 2016, Type II  . Vitamin D deficiency spring 2016    Past Surgical History:  Procedure Laterality Date  . AMPUTATION Left 08/03/2015   Procedure: AMPUTATION LEFT GREAT TOE;  Surgeon: Newt Minion, MD;  Location: [facility];  Service: Orthopedics;  Laterality: Left;  . AMPUTATION Left 12/02/2015   Procedure: amputation of left 2nd digit  . AMPUTATION Left 04/10/2016   Procedure: Left 2nd Toe Amputation at MTP Joint;  Surgeon: Newt Minion, MD;  Location: [facility];  Service: Orthopedics;  Laterality:  Left;  . AMPUTATION Left 06/09/2016   Procedure: AMPUTATION LEFT THIRD TOE;  Surgeon: [name], MD;  Location: [facility];  Service: Orthopedics;  Laterality: Left;  . AMPUTATION Left 06/26/2016   Procedure: Left Foot Transmetatarsal Amputation;  Surgeon: Newt Minion, MD;  Location: [facility];  Service: Orthopedics;  Laterality: Left;  . APPLICATION OF WOUND VAC Left 12/19/2015   Procedure: APPLICATION OF WOUND VAC;  Surgeon: Meredith Pel, MD;  Location: [facility];  Service: Orthopedics;  Laterality: Left;  . CIRCUMCISION    . COLONOSCOPY N/A 01/22/2016   Procedure: COLONOSCOPY;  Surgeon: Gatha Mayer, MD;  Location: [facility];  Service: Endoscopy;  Laterality: N/A;  . ESOPHAGOGASTRODUODENOSCOPY N/A 01/19/2016   Procedure: ESOPHAGOGASTRODUODENOSCOPY (EGD);  Surgeon: Manus Gunning, MD;  Location: [facility];  Service: Gastroenterology;  Laterality: N/A;  . ESOPHAGOGASTRODUODENOSCOPY N/A 01/22/2016   Procedure: ESOPHAGOGASTRODUODENOSCOPY (EGD);  Surgeon: Gatha Mayer, MD;  Location: [facility] ENDOSCOPY;  Service: Endoscopy;  Laterality: N/A;  . I&D EXTREMITY Left 12/02/2015   Procedure: IRRIGATION AND DEBRIDEMENT OF FOOT; LEFT SECOND TOE AMPUTATION;  Surgeon: Meredith Pel, MD;  Location: [facility];  Service: Orthopedics;  Laterality: Left;  . I&D EXTREMITY Left 12/19/2015   Procedure: I & D LEFT FOOT  WITH BEADS ;  Surgeon: Meredith Pel, MD;  Location: [facility];  Service: Orthopedics;  Laterality: Left;  . I&D EXTREMITY Right 01/17/2016   Procedure: IRRIGATION AND DEBRIDEMENT RIGHT FOOT;  Surgeon: Newt Minion, MD;  Location: [facility];  Service: Orthopedics;  Laterality: Right;  . I&D EXTREMITY Right 01/21/2018   Procedure: IRRIGATION AND DEBRIDEMENT RIGHT FOOT;  Surgeon: Newt Minion, MD;  Location: [facility];  Service: Orthopedics;  Laterality: Right;  . INCISION AND DRAINAGE FOOT Right 01/17/2016  . INGUINAL HERNIA REPAIR Bilateral ~ 1983- ~ 1986  . TONSILLECTOMY  ~ 72    Family History   Problem Relation Age of Onset  . Hypertension Mother   . Diabetes Mother   . Hyperlipidemia Mother   . Heart disease Mother        s/p pacemaker  . Diabetes Father   . Hypertension Father   . Stroke Father   . Heart attack Father        first MI @ 38.  . Depression Father   . Dementia Father   . Stroke Brother   . ADD / ADHD Brother   . Anxiety disorder Brother   . Bipolar disorder Brother   . OCD Brother   . Sexual abuse Brother    Social History:  reports that he has never smoked. He has never used smokeless tobacco. He reports that he does not drink alcohol or use drugs.  Allergies:  Allergies  Allergen Reactions  . Other Diarrhea and Other (See Comments)    Red meat causes stomach pains, bloating and diarrhea  . Lactose Intolerance ([place]) Diarrhea and Other (See Comments)    Bloating     No medications prior to admission.    No results found for this or any previous visit (from the past 48 hour(s)). No results found.  Review of Systems  All other systems reviewed and are negative.   There were no vitals taken for this visit. Physical Exam  Patient is alert, oriented, no adenopathy, well-dressed, normal affect, normal respiratory effort. Examination patient has an antalgic gait.  He has a new ulcer over the mid aspect of the right great toe this is not on a pressure point.  Appears to be where patient had probed his toe to try to decompress of infection.  The ulcer probes all the way down to bone.  He also has a area of skin breakdown over the medial border of the first metatarsal.  Review of the MRI scan shows bony changes consistent with osteomyelitis over the great toe.  There is no ascending cellulitis he does have swelling he denies any pain.  Assessment/Plan 1. Subacute osteomyelitis, right ankle and foot ([place])   2. Diabetic ulcer of right midfoot associated with type 2 diabetes mellitus, with necrosis of muscle ([place])     Plan: We will plan for first  ray amputation due to the osteomyelitis of the first metatarsal and complete the first ray amputation to remove the nonviable skin and soft tissue over the medial column of the first metatarsal.  Risks and benefits were discussed including persistent infection nonhealing of the incision.  Patient states he understands wished to proceed with surgery    Newt Minion, MD 02/27/2018, 6:45 AM

## 2018-02-27 NOTE — Anesthesia Preprocedure Evaluation (Signed)
Anesthesia Evaluation  Patient identified by MRN, date of birth, ID band Patient awake    Reviewed: Allergy & Precautions, H&P , NPO status , Patient's Chart, lab work & pertinent test results, reviewed documented beta blocker date and time   History of Anesthesia Complications Negative for: history of anesthetic complications  Airway Mallampati: II  TM Distance: >3 FB Neck ROM: Full    Dental  (+) Teeth Intact, Dental Advisory Given   Pulmonary shortness of breath,    breath sounds clear to auscultation       Cardiovascular hypertension, Pt. on medications and Pt. on home beta blockers + Peripheral Vascular Disease and +CHF  negative cardio ROS   Rhythm:Regular Rate:Normal     Neuro/Psych  Headaches, PSYCHIATRIC DISORDERS Anxiety Depression Bipolar Disorder TIA   GI/Hepatic Neg liver ROS, GERD  Medicated and Controlled,  Endo/Other  diabetes, Insulin Dependent  Renal/GU CRFRenal disease  negative genitourinary   Musculoskeletal   Abdominal   Peds  Hematology  (+) anemia , JEHOVAH'S WITNESS  Anesthesia Other Findings   Reproductive/Obstetrics                             Anesthesia Physical  Anesthesia Plan  ASA: III  Anesthesia Plan: General   Post-op Pain Management:    Induction: Intravenous  PONV Risk Score and Plan: 2 and Ondansetron and Midazolam  Airway Management Planned: LMA  Additional Equipment: None  Intra-op Plan:   Post-operative Plan: Extubation in OR  Informed Consent: I have reviewed the patients History and Physical, chart, labs and discussed the procedure including the risks, benefits and alternatives for the proposed anesthesia with the patient or authorized representative who has indicated his/her understanding and acceptance.   Dental advisory given  Plan Discussed with: CRNA and Surgeon  Anesthesia Plan Comments:         Anesthesia Quick  Evaluation

## 2018-02-27 NOTE — Op Note (Signed)
02/27/2018  11:04 AM  PATIENT:  Antony Madura    PRE-OPERATIVE DIAGNOSIS:  Osteomyelitis Right Great Toe  POST-OPERATIVE DIAGNOSIS:  Same  PROCEDURE:  RIGHT FOOT 1ST RAY AMPUTATION  SURGEON:  Newt Minion, MD  PHYSICIAN ASSISTANT:None ANESTHESIA:   General  PREOPERATIVE INDICATIONS:  Yakov Bergen is a  47 y.o. male with a diagnosis of Osteomyelitis Right Great Toe who failed conservative measures and elected for surgical management.    The risks benefits and alternatives were discussed with the patient preoperatively including but not limited to the risks of infection, bleeding, nerve injury, cardiopulmonary complications, the need for revision surgery, among others, and the patient was willing to proceed.  OPERATIVE IMPLANTS: None.  @ENCIMAGES @  OPERATIVE FINDINGS: No deep abscess at the resection margins.  OPERATIVE PROCEDURE: Patient was brought to the operating room and underwent a general anesthetic.  After adequate levels of anesthesia were obtained patient's right lower extremity was prepped using DuraPrep draped into a sterile field a timeout was called.  A racquet incision was made around the toe and the ulcerative tissue at the toe as well as over the first metatarsal.  The ulcerative tissue and the first ray were resected in one block of tissue.  Electrocautery was used for hemostasis.  The wound was irrigated with normal saline.  The incision was closed using 2-0 nylon.  A sterile compressive dressing was applied patient was extubated taken to the PACU in stable condition.   DISCHARGE PLANNING:  Antibiotic duration: 23-hour IV antibiotics  Weightbearing: Nonweightbearing on the right  Pain medication: Prescription provided for Vicodin  Dressing care/ Wound VAC: Follow-up in 1 week to change the dressing  Ambulatory devices: Walker or crutches  Discharge to: Discharge to home at 5 PM Saturday.  Follow-up: In the office 1 week post operative.

## 2018-02-27 NOTE — Evaluation (Signed)
Physical Therapy Evaluation & Discharge Patient Details Name: Shane [name] MRN: [id number] DOB: 1970-09-11 Today's Date: 02/27/2018   History of Present Illness  Pt is a 47 y/o male s/p R first ray amputation. PMH including but not limited to Bipolar, DM and HTN.  Clinical Impression  Pt presented supine in bed with HOB elevated, awake and willing to participate in therapy session. Prior to admission, pt reported that he was using a w/c for mobility and performing transfers himself. He required some assistance from his wife for ADLs. He is currently able to perform bed mobility at modified independence and transfers with supervision. Pt appears to be at his most recent functional baseline. No further acute PT needs or f/u PT services indicated at this time. PT signing off. Pt may need f/u therapy once he can WB through R LE and once cleared by MD.     Follow Up Recommendations No PT follow up;Supervision for mobility/OOB    Equipment Recommendations  None recommended by PT    Recommendations for Other Services       Precautions / Restrictions Precautions Precautions: Fall Restrictions Weight Bearing Restrictions: Yes RLE Weight Bearing: Non weight bearing      Mobility  Bed Mobility Overal bed mobility: Modified Independent             General bed mobility comments: increased time  Transfers Overall transfer level: Needs assistance Equipment used: Rolling walker (2 wheeled) Transfers: Sit to/from [facility] Sit to Stand: Supervision Stand pivot transfers: Supervision       General transfer comment: supervision for safety; pt stood from EOB with good technique and pivoted to recliner chair on his L with supervision, good technique utilized  Ambulation/Gait                Stairs            Wheelchair Mobility    Modified Rankin (Stroke Patients Only)       Balance Overall balance assessment: Needs assistance Sitting-balance  support: No upper extremity supported Sitting balance-Leahy Scale: Good     Standing balance support: Bilateral upper extremity supported Standing balance-Leahy Scale: Poor Standing balance comment: Reliant on BUE support                             Pertinent Vitals/Pain Pain Assessment: 0-10 Pain Score: 8  Pain Location: R foot  Pain Descriptors / Indicators: Sore Pain Intervention(s): Monitored during session;Repositioned;RN gave pain meds during session    Home Living Family/patient expects to be discharged to:: Private residence Living Arrangements: Spouse/significant other Available Help at Discharge: Family;Available 24 hours/day Type of Home: Apartment Home Access: Level entry     Home Layout: One level Home Equipment: Walker - 4 wheels;Cane - single point;Wheelchair - manual      Prior Function Level of Independence: Needs assistance   Gait / Transfers Assistance Needed: was using w/c for mobility, could perform transfers himself and mobilize himself in w/c  ADL's / Homemaking Assistance Needed: required assistance with dressing and bathing from wife        Hand Dominance        Extremity/Trunk Assessment   Upper Extremity Assessment Upper Extremity Assessment: Overall WFL for tasks assessed    Lower Extremity Assessment Lower Extremity Assessment: Overall WFL for tasks assessed;RLE deficits/detail RLE Deficits / Details: pt able to maintain NWB R LE throughout independently    Cervical / Trunk Assessment Cervical /  Trunk Assessment: Normal  Communication   Communication: No difficulties  Cognition Arousal/Alertness: Awake/alert Behavior During Therapy: WFL for tasks assessed/performed Overall Cognitive Status: Within Functional Limits for tasks assessed                                        General Comments      Exercises     Assessment/Plan    PT Assessment Patent does not need any further PT services  PT  Problem List         PT Treatment Interventions      PT Goals (Current goals can be found in the Care Plan section)  Acute Rehab PT Goals Patient Stated Goal: to get his foot healed up    Frequency     Barriers to discharge        Co-evaluation               AM-PAC PT "6 Clicks" Daily Activity  Outcome Measure Difficulty turning over in bed (including adjusting bedclothes, sheets and blankets)?: None Difficulty moving from lying on back to sitting on the side of the bed? : None Difficulty sitting down on and standing up from a chair with arms (e.g., wheelchair, bedside commode, etc,.)?: Unable Help needed moving to and from a bed to chair (including a wheelchair)?: None Help needed walking in hospital room?: Total Help needed climbing 3-5 steps with a railing? : Total 6 Click Score: 15    End of Session Equipment Utilized During Treatment: Gait belt Activity Tolerance: Patient tolerated treatment well Patient left: in chair;with call bell/phone within reach Nurse Communication: Mobility status PT Visit Diagnosis: Other abnormalities of gait and mobility (R26.89)    Time: 0518-3358 PT Time Calculation (min) (ACUTE ONLY): 20 min   Charges:   PT Evaluation $PT Eval Low Complexity: [place], PT, DPT  Acute Rehabilitation Services Pager [phone] Office [place] 02/27/2018, 5:13 PM

## 2018-02-28 ENCOUNTER — Other Ambulatory Visit: Payer: Self-pay

## 2018-02-28 ENCOUNTER — Encounter (HOSPITAL_COMMUNITY): Payer: Self-pay | Admitting: Orthopedic Surgery

## 2018-02-28 LAB — GLUCOSE, CAPILLARY
GLUCOSE-CAPILLARY: 194 mg/dL — AB (ref 70–99)
Glucose-Capillary: 314 mg/dL — ABNORMAL HIGH (ref 70–99)
Glucose-Capillary: 247 mg/dL — ABNORMAL HIGH (ref 70–99)
Glucose-Capillary: 154 mg/dL — ABNORMAL HIGH (ref 70–99)

## 2018-02-28 MED ORDER — INFLUENZA VAC SPLIT QUAD 0.5 ML IM SUSY
0.5000 mL | PREFILLED_SYRINGE | INTRAMUSCULAR | Status: AC
  Administered 2018-03-01: 0.5 mL via INTRAMUSCULAR
  Filled 2018-02-28: qty 0.5

## 2018-02-28 NOTE — Plan of Care (Signed)
  Problem: Pain Managment: Goal: General experience of comfort will improve Outcome: Progressing   Problem: Safety: Goal: Ability to remain free from injury will improve Outcome: Progressing   Problem: Skin Integrity: Goal: Risk for impaired skin integrity will decrease Outcome: Progressing   Problem: Coping: Goal: Level of anxiety will decrease Outcome: Progressing   Problem: Activity: Goal: Risk for activity intolerance will decrease Outcome: Progressing   

## 2018-02-28 NOTE — Progress Notes (Signed)
   Subjective:  Patient reports pain as moderate.  No events.  Objective:   VITALS:   Vitals:   02/27/18 1650 02/27/18 1953 02/27/18 2315 02/28/18 0355  BP: 108/76 (!) 129/92 108/71 99/69  Pulse: 83 74 78 77  Resp: 18 12 20 20   Temp: 98 F (36.7 C) 97.6 F (36.4 C) 98 F (36.7 C) 97.8 F (36.6 C)  TempSrc: Oral Oral Oral Oral  SpO2: 97% 100% 99% 96%    Neurologically intact Neurovascular intact Sensation intact distally Intact pulses distally Dorsiflexion/Plantar flexion intact Incision: dressing C/D/I and no drainage No cellulitis present Compartment soft   Lab Results  Component Value Date   WBC 7.1 02/21/2018   HGB 9.3 (L) 02/21/2018   HCT 30.1 (L) 02/21/2018   MCV 95.0 02/21/2018   PLT 276 02/21/2018     Assessment/Plan:  1 Day Post-Op   - Expected postop acute blood loss anemia - will monitor for symptoms - Up with PT/OT - DVT ppx - SCDs, ambulation, aspirin - NWB operative extremity, elevate - Pain controlled - Discharge planning - he has no one at home tonight therefore d/c home tomorrow - continue flomax  Eduard Roux 02/28/2018, 8:23 AM [phone]

## 2018-02-28 NOTE — Progress Notes (Signed)
Patient had one episode of vomiting after complaining of nausea. Will continue to monitor.

## 2018-03-01 LAB — GLUCOSE, CAPILLARY
GLUCOSE-CAPILLARY: 219 mg/dL — AB (ref 70–99)
Glucose-Capillary: 178 mg/dL — ABNORMAL HIGH (ref 70–99)

## 2018-03-01 NOTE — Progress Notes (Signed)
Pt was ambulating to the bathroom, NWB to RLE maint. Right foot dressing reinforced, minimal bleeding noted. Discharge instructions given to pt, discharged to home accompanied by wife.

## 2018-03-02 ENCOUNTER — Encounter (HOSPITAL_COMMUNITY): Payer: Self-pay | Admitting: *Deleted

## 2018-03-02 ENCOUNTER — Emergency Department (HOSPITAL_COMMUNITY): Payer: Medicare HMO

## 2018-03-02 ENCOUNTER — Inpatient Hospital Stay (HOSPITAL_COMMUNITY)
Admission: EM | Admit: 2018-03-02 | Discharge: 2018-03-10 | DRG: 393 | Disposition: A | Discharge status: Home / Self Care | Payer: Medicare HMO | Attending: Internal Medicine | Admitting: Internal Medicine

## 2018-03-02 ENCOUNTER — Inpatient Hospital Stay (HOSPITAL_COMMUNITY): Payer: Medicare HMO

## 2018-03-02 DIAGNOSIS — I10 Essential (primary) hypertension: Secondary | ICD-10-CM | POA: Diagnosis not present

## 2018-03-02 DIAGNOSIS — M86271 Subacute osteomyelitis, right ankle and foot: Secondary | ICD-10-CM | POA: Diagnosis present

## 2018-03-02 DIAGNOSIS — K3189 Other diseases of stomach and duodenum: Secondary | ICD-10-CM | POA: Diagnosis not present

## 2018-03-02 DIAGNOSIS — K2900 Acute gastritis without bleeding: Secondary | ICD-10-CM | POA: Diagnosis present

## 2018-03-02 DIAGNOSIS — E118 Type 2 diabetes mellitus with unspecified complications: Secondary | ICD-10-CM | POA: Diagnosis not present

## 2018-03-02 DIAGNOSIS — Z833 Family history of diabetes mellitus: Secondary | ICD-10-CM

## 2018-03-02 DIAGNOSIS — I130 Hypertensive heart and chronic kidney disease with heart failure and stage 1 through stage 4 chronic kidney disease, or unspecified chronic kidney disease: Secondary | ICD-10-CM | POA: Diagnosis present

## 2018-03-02 DIAGNOSIS — IMO0002 Reserved for concepts with insufficient information to code with codable children: Secondary | ICD-10-CM | POA: Diagnosis present

## 2018-03-02 DIAGNOSIS — I5032 Chronic diastolic (congestive) heart failure: Secondary | ICD-10-CM | POA: Diagnosis present

## 2018-03-02 DIAGNOSIS — Z794 Long term (current) use of insulin: Secondary | ICD-10-CM

## 2018-03-02 DIAGNOSIS — H409 Unspecified glaucoma: Secondary | ICD-10-CM | POA: Diagnosis present

## 2018-03-02 DIAGNOSIS — F319 Bipolar disorder, unspecified: Secondary | ICD-10-CM | POA: Diagnosis present

## 2018-03-02 DIAGNOSIS — N183 Chronic kidney disease, stage 3 unspecified: Secondary | ICD-10-CM | POA: Diagnosis present

## 2018-03-02 DIAGNOSIS — K253 Acute gastric ulcer without hemorrhage or perforation: Secondary | ICD-10-CM

## 2018-03-02 DIAGNOSIS — Z89431 Acquired absence of right foot: Secondary | ICD-10-CM

## 2018-03-02 DIAGNOSIS — Z8673 Personal history of transient ischemic attack (TIA), and cerebral infarction without residual deficits: Secondary | ICD-10-CM

## 2018-03-02 DIAGNOSIS — E1122 Type 2 diabetes mellitus with diabetic chronic kidney disease: Secondary | ICD-10-CM | POA: Diagnosis present

## 2018-03-02 DIAGNOSIS — K3184 Gastroparesis: Secondary | ICD-10-CM | POA: Diagnosis present

## 2018-03-02 DIAGNOSIS — R933 Abnormal findings on diagnostic imaging of other parts of digestive tract: Secondary | ICD-10-CM | POA: Diagnosis not present

## 2018-03-02 DIAGNOSIS — Z23 Encounter for immunization: Secondary | ICD-10-CM | POA: Diagnosis present

## 2018-03-02 DIAGNOSIS — E785 Hyperlipidemia, unspecified: Secondary | ICD-10-CM | POA: Diagnosis present

## 2018-03-02 DIAGNOSIS — R109 Unspecified abdominal pain: Secondary | ICD-10-CM

## 2018-03-02 DIAGNOSIS — Z818 Family history of other mental and behavioral disorders: Secondary | ICD-10-CM

## 2018-03-02 DIAGNOSIS — E1151 Type 2 diabetes mellitus with diabetic peripheral angiopathy without gangrene: Secondary | ICD-10-CM | POA: Diagnosis present

## 2018-03-02 DIAGNOSIS — E1165 Type 2 diabetes mellitus with hyperglycemia: Secondary | ICD-10-CM | POA: Diagnosis present

## 2018-03-02 DIAGNOSIS — Z8349 Family history of other endocrine, nutritional and metabolic diseases: Secondary | ICD-10-CM

## 2018-03-02 DIAGNOSIS — E739 Lactose intolerance, unspecified: Secondary | ICD-10-CM | POA: Diagnosis present

## 2018-03-02 DIAGNOSIS — I70209 Unspecified atherosclerosis of native arteries of extremities, unspecified extremity: Secondary | ICD-10-CM | POA: Diagnosis present

## 2018-03-02 DIAGNOSIS — I700 Atherosclerosis of aorta: Secondary | ICD-10-CM | POA: Diagnosis present

## 2018-03-02 DIAGNOSIS — E1169 Type 2 diabetes mellitus with other specified complication: Secondary | ICD-10-CM | POA: Diagnosis present

## 2018-03-02 DIAGNOSIS — E1143 Type 2 diabetes mellitus with diabetic autonomic (poly)neuropathy: Secondary | ICD-10-CM | POA: Diagnosis present

## 2018-03-02 DIAGNOSIS — Z823 Family history of stroke: Secondary | ICD-10-CM

## 2018-03-02 DIAGNOSIS — R1010 Upper abdominal pain, unspecified: Secondary | ICD-10-CM | POA: Diagnosis not present

## 2018-03-02 DIAGNOSIS — K559 Vascular disorder of intestine, unspecified: Secondary | ICD-10-CM | POA: Diagnosis present

## 2018-03-02 DIAGNOSIS — A419 Sepsis, unspecified organism: Secondary | ICD-10-CM | POA: Diagnosis present

## 2018-03-02 DIAGNOSIS — J45909 Unspecified asthma, uncomplicated: Secondary | ICD-10-CM | POA: Diagnosis present

## 2018-03-02 DIAGNOSIS — K2970 Gastritis, unspecified, without bleeding: Secondary | ICD-10-CM | POA: Diagnosis not present

## 2018-03-02 DIAGNOSIS — Z89432 Acquired absence of left foot: Secondary | ICD-10-CM

## 2018-03-02 DIAGNOSIS — Z91018 Allergy to other foods: Secondary | ICD-10-CM

## 2018-03-02 DIAGNOSIS — N400 Enlarged prostate without lower urinary tract symptoms: Secondary | ICD-10-CM | POA: Diagnosis present

## 2018-03-02 DIAGNOSIS — E11621 Type 2 diabetes mellitus with foot ulcer: Secondary | ICD-10-CM | POA: Diagnosis present

## 2018-03-02 DIAGNOSIS — K9189 Other postprocedural complications and disorders of digestive system: Principal | ICD-10-CM | POA: Diagnosis present

## 2018-03-02 DIAGNOSIS — D631 Anemia in chronic kidney disease: Secondary | ICD-10-CM | POA: Diagnosis present

## 2018-03-02 DIAGNOSIS — R1013 Epigastric pain: Secondary | ICD-10-CM | POA: Diagnosis not present

## 2018-03-02 DIAGNOSIS — L97419 Non-pressure chronic ulcer of right heel and midfoot with unspecified severity: Secondary | ICD-10-CM | POA: Diagnosis present

## 2018-03-02 DIAGNOSIS — K219 Gastro-esophageal reflux disease without esophagitis: Secondary | ICD-10-CM | POA: Diagnosis present

## 2018-03-02 DIAGNOSIS — Z79899 Other long term (current) drug therapy: Secondary | ICD-10-CM

## 2018-03-02 DIAGNOSIS — Z8249 Family history of ischemic heart disease and other diseases of the circulatory system: Secondary | ICD-10-CM

## 2018-03-02 DIAGNOSIS — L97519 Non-pressure chronic ulcer of other part of right foot with unspecified severity: Secondary | ICD-10-CM | POA: Diagnosis present

## 2018-03-02 DIAGNOSIS — K259 Gastric ulcer, unspecified as acute or chronic, without hemorrhage or perforation: Secondary | ICD-10-CM | POA: Diagnosis not present

## 2018-03-02 HISTORY — DX: Acute gastritis without bleeding: K29.00

## 2018-03-02 LAB — GLUCOSE, CAPILLARY
Glucose-Capillary: 256 mg/dL — ABNORMAL HIGH (ref 70–99)
Glucose-Capillary: 234 mg/dL — ABNORMAL HIGH (ref 70–99)

## 2018-03-02 LAB — CBC
HCT: 33.2 % — ABNORMAL LOW (ref 39.0–52.0)
Hemoglobin: 10.6 g/dL — ABNORMAL LOW (ref 13.0–17.0)
MCH: 29.7 pg (ref 26.0–34.0)
MCHC: 31.9 g/dL (ref 30.0–36.0)
MCV: 93 fL (ref 78.0–100.0)
PLATELETS: 308 10*3/uL (ref 150–400)
RBC: 3.57 MIL/uL — AB (ref 4.22–5.81)
RDW: 12.7 % (ref 11.5–15.5)
WBC: 30.4 10*3/uL — AB (ref 4.0–10.5)

## 2018-03-02 LAB — URINALYSIS, ROUTINE W REFLEX MICROSCOPIC
BACTERIA UA: NONE SEEN
Bilirubin Urine: NEGATIVE
Glucose, UA: >=500 mg/dL — AB
Ketones, ur: 20 mg/dL — AB
Leukocytes, UA: NEGATIVE
Nitrite: NEGATIVE
PH: 8 (ref 5.0–8.0)
Protein, ur: 100 mg/dL — AB
SPECIFIC GRAVITY, URINE: 1.018 (ref 1.005–1.030)

## 2018-03-02 LAB — I-STAT CG4 LACTIC ACID, ED: LACTIC ACID, VENOUS: 3.26 mmol/L — AB (ref 0.5–1.9)

## 2018-03-02 LAB — COMPREHENSIVE METABOLIC PANEL
ALT: 70 U/L — AB (ref 0–44)
AST: 40 U/L (ref 15–41)
Albumin: 2.8 g/dL — ABNORMAL LOW (ref 3.5–5.0)
Alkaline Phosphatase: 61 U/L (ref 38–126)
Anion gap: 11 (ref 5–15)
BUN: 22 mg/dL — ABNORMAL HIGH (ref 6–20)
CO2: 25 mmol/L (ref 22–32)
Calcium: 8.6 mg/dL — ABNORMAL LOW (ref 8.9–10.3)
Chloride: 102 mmol/L (ref 98–111)
Creatinine, Ser: 1.37 mg/dL — ABNORMAL HIGH (ref 0.61–1.24)
GFR calc non Af Amer: 60 mL/min — ABNORMAL LOW (ref >60)
Glucose, Bld: 329 mg/dL — ABNORMAL HIGH (ref 70–99)
Potassium: 4.5 mmol/L (ref 3.5–5.1)
SODIUM: 138 mmol/L (ref 135–145)
Total Bilirubin: 0.5 mg/dL (ref 0.3–1.2)
Total Protein: 6.6 g/dL (ref 6.5–8.1)

## 2018-03-02 LAB — PROCALCITONIN: PROCALCITONIN: 0.32 ng/mL

## 2018-03-02 LAB — I-STAT TROPONIN, ED: Troponin i, poc: 0 ng/mL (ref 0.00–0.08)

## 2018-03-02 LAB — LACTIC ACID, PLASMA
LACTIC ACID, VENOUS: 1.7 mmol/L (ref 0.5–1.9)
LACTIC ACID, VENOUS: 1.4 mmol/L (ref 0.5–1.9)

## 2018-03-02 LAB — LIPASE, BLOOD: Lipase: 23 U/L (ref 11–51)

## 2018-03-02 MED ORDER — INSULIN ASPART 100 UNIT/ML ~~LOC~~ SOLN
0.0000 [IU] | Freq: Every day | SUBCUTANEOUS | Status: DC
  Administered 2018-03-02: 2 [IU] via SUBCUTANEOUS

## 2018-03-02 MED ORDER — HYDROCODONE-ACETAMINOPHEN 5-325 MG PO TABS
1.0000 | ORAL_TABLET | ORAL | Status: DC | PRN
  Administered 2018-03-02 – 2018-03-03 (×2): 1 via ORAL
  Filled 2018-03-02 (×3): qty 1

## 2018-03-02 MED ORDER — SODIUM CHLORIDE 0.9 % IV BOLUS
1000.0000 mL | Freq: Once | INTRAVENOUS | Status: AC
  Administered 2018-03-02: 1000 mL via INTRAVENOUS

## 2018-03-02 MED ORDER — SODIUM CHLORIDE 0.9 % IV BOLUS (SEPSIS)
1000.0000 mL | Freq: Once | INTRAVENOUS | Status: AC
  Administered 2018-03-02: 1000 mL via INTRAVENOUS

## 2018-03-02 MED ORDER — SODIUM CHLORIDE 0.9 % IV SOLN
2.0000 g | INTRAVENOUS | Status: DC
  Administered 2018-03-03 – 2018-03-06 (×4): 2 g via INTRAVENOUS
  Filled 2018-03-02 (×5): qty 20

## 2018-03-02 MED ORDER — POLYETHYLENE GLYCOL 3350 17 G PO PACK: 17.0000 g | PACK | Freq: Every day | ORAL | Status: DC | PRN

## 2018-03-02 MED ORDER — ENOXAPARIN SODIUM 40 MG/0.4ML ~~LOC~~ SOLN
40.0000 mg | SUBCUTANEOUS | Status: DC
  Administered 2018-03-02 – 2018-03-09 (×8): 40 mg via SUBCUTANEOUS
  Filled 2018-03-02 (×8): qty 0.4

## 2018-03-02 MED ORDER — FAMOTIDINE IN NACL 20-0.9 MG/50ML-% IV SOLN
20.0000 mg | Freq: Once | INTRAVENOUS | Status: AC
  Administered 2018-03-02: 20 mg via INTRAVENOUS
  Filled 2018-03-02: qty 50

## 2018-03-02 MED ORDER — METRONIDAZOLE IN NACL 5-0.79 MG/ML-% IV SOLN
500.0000 mg | Freq: Three times a day (TID) | INTRAVENOUS | Status: DC
  Administered 2018-03-02 – 2018-03-07 (×15): 500 mg via INTRAVENOUS
  Filled 2018-03-02 (×15): qty 100

## 2018-03-02 MED ORDER — SODIUM CHLORIDE 0.9 % IV SOLN
INTRAVENOUS | Status: DC
  Administered 2018-03-02 – 2018-03-03 (×3): via INTRAVENOUS

## 2018-03-02 MED ORDER — CARBAMAZEPINE 200 MG PO TABS
200.0000 mg | ORAL_TABLET | Freq: Every day | ORAL | Status: DC
  Administered 2018-03-02 – 2018-03-09 (×8): 200 mg via ORAL
  Filled 2018-03-02 (×8): qty 1

## 2018-03-02 MED ORDER — ACETAMINOPHEN 650 MG RE SUPP: 650.0000 mg | Freq: Four times a day (QID) | RECTAL | Status: DC | PRN

## 2018-03-02 MED ORDER — DOCUSATE SODIUM 100 MG PO CAPS
100.0000 mg | ORAL_CAPSULE | Freq: Two times a day (BID) | ORAL | Status: DC
  Administered 2018-03-02 – 2018-03-07 (×9): 100 mg via ORAL
  Filled 2018-03-02 (×15): qty 1

## 2018-03-02 MED ORDER — ATORVASTATIN CALCIUM 80 MG PO TABS
80.0000 mg | ORAL_TABLET | Freq: Every morning | ORAL | Status: DC
  Administered 2018-03-03 – 2018-03-10 (×8): 80 mg via ORAL
  Filled 2018-03-02: qty 8
  Filled 2018-03-02: qty 1
  Filled 2018-03-02: qty 8
  Filled 2018-03-02: qty 1
  Filled 2018-03-02: qty 4
  Filled 2018-03-02: qty 1
  Filled 2018-03-02 (×3): qty 4

## 2018-03-02 MED ORDER — IOHEXOL 300 MG/ML  SOLN
100.0000 mL | Freq: Once | INTRAMUSCULAR | Status: AC | PRN
  Administered 2018-03-02: 100 mL via INTRAVENOUS

## 2018-03-02 MED ORDER — INSULIN ASPART 100 UNIT/ML ~~LOC~~ SOLN
0.0000 [IU] | Freq: Three times a day (TID) | SUBCUTANEOUS | Status: DC
  Administered 2018-03-02: 8 [IU] via SUBCUTANEOUS
  Administered 2018-03-03: 5 [IU] via SUBCUTANEOUS
  Administered 2018-03-03 – 2018-03-06 (×4): 2 [IU] via SUBCUTANEOUS
  Administered 2018-03-07: 3 [IU] via SUBCUTANEOUS
  Administered 2018-03-08: 5 [IU] via SUBCUTANEOUS
  Administered 2018-03-09: 2 [IU] via SUBCUTANEOUS
  Administered 2018-03-09: 3 [IU] via SUBCUTANEOUS

## 2018-03-02 MED ORDER — ONDANSETRON HCL 4 MG/2ML IJ SOLN
4.0000 mg | Freq: Four times a day (QID) | INTRAMUSCULAR | Status: DC | PRN
  Administered 2018-03-02 – 2018-03-07 (×5): 4 mg via INTRAVENOUS
  Filled 2018-03-02 (×5): qty 2

## 2018-03-02 MED ORDER — ACETAMINOPHEN 325 MG PO TABS: 650.0000 mg | ORAL_TABLET | Freq: Four times a day (QID) | ORAL | Status: DC | PRN

## 2018-03-02 MED ORDER — METRONIDAZOLE IN NACL 5-0.79 MG/ML-% IV SOLN
500.0000 mg | Freq: Once | INTRAVENOUS | Status: AC
  Administered 2018-03-02: 500 mg via INTRAVENOUS
  Filled 2018-03-02: qty 100

## 2018-03-02 MED ORDER — CARVEDILOL 25 MG PO TABS
25.0000 mg | ORAL_TABLET | Freq: Two times a day (BID) | ORAL | Status: DC
  Administered 2018-03-02 – 2018-03-10 (×16): 25 mg via ORAL
  Filled 2018-03-02: qty 1
  Filled 2018-03-02 (×2): qty 2
  Filled 2018-03-02 (×2): qty 1
  Filled 2018-03-02 (×4): qty 2
  Filled 2018-03-02: qty 1
  Filled 2018-03-02 (×2): qty 2
  Filled 2018-03-02 (×2): qty 1
  Filled 2018-03-02 (×4): qty 2

## 2018-03-02 MED ORDER — MORPHINE SULFATE (PF) 2 MG/ML IV SOLN
2.0000 mg | INTRAVENOUS | Status: DC | PRN
  Administered 2018-03-02 – 2018-03-03 (×3): 2 mg via INTRAVENOUS
  Filled 2018-03-02 (×4): qty 1

## 2018-03-02 MED ORDER — AMLODIPINE BESYLATE 5 MG PO TABS
5.0000 mg | ORAL_TABLET | Freq: Every day | ORAL | Status: DC
  Administered 2018-03-02 – 2018-03-10 (×9): 5 mg via ORAL
  Filled 2018-03-02 (×9): qty 1

## 2018-03-02 MED ORDER — ONDANSETRON 4 MG PO TBDP
4.0000 mg | ORAL_TABLET | Freq: Once | ORAL | Status: AC | PRN
  Administered 2018-03-02: 4 mg via ORAL
  Filled 2018-03-02: qty 1

## 2018-03-02 MED ORDER — SUCRALFATE 1 G PO TABS
1.0000 g | ORAL_TABLET | Freq: Three times a day (TID) | ORAL | Status: DC
  Administered 2018-03-02 – 2018-03-08 (×21): 1 g via ORAL
  Filled 2018-03-02 (×23): qty 1

## 2018-03-02 MED ORDER — INSULIN GLARGINE 100 UNIT/ML ~~LOC~~ SOLN
46.0000 [IU] | Freq: Every day | SUBCUTANEOUS | Status: DC
  Administered 2018-03-02: 46 [IU] via SUBCUTANEOUS
  Filled 2018-03-02: qty 0.46

## 2018-03-02 MED ORDER — TAMSULOSIN HCL 0.4 MG PO CAPS
0.4000 mg | ORAL_CAPSULE | Freq: Every day | ORAL | Status: DC
  Administered 2018-03-02 – 2018-03-10 (×9): 0.4 mg via ORAL
  Filled 2018-03-02 (×9): qty 1

## 2018-03-02 MED ORDER — SODIUM CHLORIDE 0.9 % IV SOLN
2.0000 g | Freq: Once | INTRAVENOUS | Status: AC
  Administered 2018-03-02: 2 g via INTRAVENOUS
  Filled 2018-03-02: qty 20

## 2018-03-02 MED ORDER — SENNOSIDES-DOCUSATE SODIUM 8.6-50 MG PO TABS: 1.0000 | ORAL_TABLET | Freq: Every evening | ORAL | Status: DC | PRN

## 2018-03-02 MED ORDER — SODIUM CHLORIDE 0.9% FLUSH
3.0000 mL | Freq: Two times a day (BID) | INTRAVENOUS | Status: DC
  Administered 2018-03-02 – 2018-03-10 (×12): 3 mL via INTRAVENOUS

## 2018-03-02 MED ORDER — FAMOTIDINE IN NACL 20-0.9 MG/50ML-% IV SOLN
20.0000 mg | Freq: Two times a day (BID) | INTRAVENOUS | Status: DC
  Administered 2018-03-02: 20 mg via INTRAVENOUS
  Filled 2018-03-02 (×2): qty 50

## 2018-03-02 MED ORDER — FLUOXETINE HCL 20 MG PO CAPS
40.0000 mg | ORAL_CAPSULE | Freq: Every day | ORAL | Status: DC
  Administered 2018-03-02 – 2018-03-10 (×9): 40 mg via ORAL
  Filled 2018-03-02 (×9): qty 2

## 2018-03-02 MED ORDER — HYDROMORPHONE HCL 1 MG/ML IJ SOLN
1.0000 mg | Freq: Once | INTRAMUSCULAR | Status: AC
  Administered 2018-03-02: 1 mg via INTRAVENOUS
  Filled 2018-03-02: qty 1

## 2018-03-02 MED ORDER — ONDANSETRON HCL 4 MG/2ML IJ SOLN
4.0000 mg | Freq: Once | INTRAMUSCULAR | Status: AC
  Administered 2018-03-02: 4 mg via INTRAVENOUS
  Filled 2018-03-02: qty 2

## 2018-03-02 MED ORDER — METHOCARBAMOL 500 MG PO TABS
500.0000 mg | ORAL_TABLET | Freq: Four times a day (QID) | ORAL | Status: DC | PRN
  Administered 2018-03-02: 500 mg via ORAL
  Filled 2018-03-02: qty 1

## 2018-03-02 MED ORDER — ASPIRIN EC 81 MG PO TBEC
81.0000 mg | DELAYED_RELEASE_TABLET | Freq: Every day | ORAL | Status: DC
  Administered 2018-03-02: 81 mg via ORAL
  Filled 2018-03-02: qty 1

## 2018-03-02 MED ORDER — ONDANSETRON HCL 4 MG PO TABS: 4.0000 mg | ORAL_TABLET | Freq: Four times a day (QID) | ORAL | Status: DC | PRN

## 2018-03-02 MED ORDER — GABAPENTIN 300 MG PO CAPS
300.0000 mg | ORAL_CAPSULE | Freq: Every day | ORAL | Status: DC
  Administered 2018-03-02 – 2018-03-09 (×8): 300 mg via ORAL
  Filled 2018-03-02 (×8): qty 1

## 2018-03-02 MED ORDER — TRAZODONE HCL 50 MG PO TABS
100.0000 mg | ORAL_TABLET | Freq: Every day | ORAL | Status: DC
  Administered 2018-03-02 – 2018-03-09 (×8): 100 mg via ORAL
  Filled 2018-03-02 (×8): qty 2

## 2018-03-02 NOTE — ED Notes (Signed)
Yates MD at bedside to admit

## 2018-03-02 NOTE — Progress Notes (Signed)
Shane [name] [id number]  Code Status: FULL  Admission Data: 03/02/2018 7:42 PM  Attending Provider: [name]  [id number], Charlane Ferretti, MD  Consults/ Treatment Team:   Daaiel Starlin is a 47 y.o. male patient admitted from ED awake, alert - oriented X 4 - no acute distress noted. VSS - Blood pressure (!) 211/122, pulse (!) 106, temperature 98 F (36.7 C), temperature source Oral, resp. rate 17, SpO2 100 %. no c/o shortness of breath, no c/o chest pain. Cardiac tele # 09, in place. Admission INP armband ID verified with patient/family, and in place.  SR up x 2, fall assessment complete, with patient and family able to verbalize understanding of risk associated with falls, and verbalized understanding to call nsg before up out of bed. Call light within reach, patient able to voice, and demonstrate understanding. Skin, clean-dry- intact without evidence of bruising, or skin tears.  No evidence of skin break down noted on exam.  ?  Will cont to eval and treat per MD orders.  Melonie Florida, RN  03/02/2018 7:42 PM

## 2018-03-02 NOTE — ED Notes (Signed)
Patient transported to CT 

## 2018-03-02 NOTE — ED Triage Notes (Signed)
Pt in c/o abdominal pain with n/v since yesterday, pt actively vomiting in triage, reports pain has been constant and he is not able to keep anything down

## 2018-03-02 NOTE — ED Notes (Signed)
Elisa RN made aware reg reason for delay in obtaining blood cultures and other blood tests.

## 2018-03-02 NOTE — ED Provider Notes (Signed)
[facility] Emergency Department Provider Note MRN:  [id number]  Arrival date & time: 03/02/18     Chief Complaint   Abdominal Pain   History of Present Illness   Shane [name] is a 47 y.o. year-old male with a history of diabetes presenting to the ED with chief complaint of abdominal pain.  Patient explains that he recently had surgery on his right foot due to osteomyelitis.  Yesterday, he began experiencing persistent nonbloody nonbilious emesis.  Multiple episodes, unrelenting.  This morning, began having gradual onset epigastric pain that radiates to the central chest.  Described as burning.  9 out of 10 in severity, constant.  Denies recent fevers, no headache or vision change, no shortness of breath, no lower abdominal pain, no dysuria.  Review of Systems  A complete 10 system review of systems was obtained and all systems are negative except as noted in the HPI and PMH.   Patient's Health History    Past Medical History:  Diagnosis Date  . Anemia   . Asthma    as a child  . Bipolar disorder ([place])    pt denies this   . Chest pain    a. 2015 Reportedly normal stress test in [place].  Marland Kitchen Chronic diastolic CHF (congestive heart failure) (HCC)    a.03/2015 Echo: EF 55-60%, Gr 1 DD, mild MR, triv PR.  . Depression   . Depression with anxiety   . Dyspnea    with exertion  . GERD (gastroesophageal reflux disease)   . Glaucoma   . Hyperlipidemia   . Hypertension    a. 08/2014 Admitted with hypertensive urgency.  . Insomnia   . Internal carotid artery stenosis   . Lower GI bleed 07/04/2015  . Neuropathy   . Refusal of blood transfusions as patient is Jehovah's Witness   . TIA (transient ischemic attack) 08/2014; 03/2015   a. 08/2014 in setting of hypertensive urgency.  . Type II diabetes mellitus ([place])    started insulin spring 2016, Type II  . Vitamin D deficiency spring 2016    Past Surgical History:  Procedure Laterality Date  . AMPUTATION Left 08/03/2015   Procedure: AMPUTATION LEFT GREAT TOE;  Surgeon: Newt Minion, MD;  Location: [place];  Service: Orthopedics;  Laterality: Left;  . AMPUTATION Left 12/02/2015   Procedure: amputation of left 2nd digit  . AMPUTATION Left 04/10/2016   Procedure: Left 2nd Toe Amputation at MTP Joint;  Surgeon: Newt Minion, MD;  Location: [place];  Service: Orthopedics;  Laterality: Left;  . AMPUTATION Left 06/09/2016   Procedure: AMPUTATION LEFT THIRD TOE;  Surgeon: [name], MD;  Location: [place];  Service: Orthopedics;  Laterality: Left;  . AMPUTATION Left 06/26/2016   Procedure: Left Foot Transmetatarsal Amputation;  Surgeon: Newt Minion, MD;  Location: [place];  Service: Orthopedics;  Laterality: Left;  . AMPUTATION Right 02/27/2018   Procedure: RIGHT FOOT 1ST RAY AMPUTATION;  Surgeon: Newt Minion, MD;  Location: [place];  Service: Orthopedics;  Laterality: Right;  . APPLICATION OF WOUND VAC Left 12/19/2015   Procedure: APPLICATION OF WOUND VAC;  Surgeon: Meredith Pel, MD;  Location: [place];  Service: Orthopedics;  Laterality: Left;  . CIRCUMCISION    . COLONOSCOPY N/A 01/22/2016   Procedure: COLONOSCOPY;  Surgeon: Gatha Mayer, MD;  Location: [place];  Service: Endoscopy;  Laterality: N/A;  . ESOPHAGOGASTRODUODENOSCOPY N/A 01/19/2016   Procedure: ESOPHAGOGASTRODUODENOSCOPY (EGD);  Surgeon: Manus Gunning, MD;  Location:  [place] [facility];  Service: Gastroenterology;  Laterality: N/A;  . ESOPHAGOGASTRODUODENOSCOPY N/A 01/22/2016   Procedure: ESOPHAGOGASTRODUODENOSCOPY (EGD);  Surgeon: Gatha Mayer, MD;  Location[facility] ENDOSCOPY;  Service: Endoscopy;  Laterality: N/A;  . I&D EXTREMITY Left 12/02/2015   Procedure: IRRIGATION AND DEBRIDEMENT OF FOOT; LEFT SECOND TOE AMPUTATION;  Surgeon: Meredith Pel, MD;  Location: [place];  Service: Orthopedics;  Laterality: Left;  . I&D EXTREMITY Left 12/19/2015   Procedure: I & D LEFT FOOT WITH BEADS ;  Surgeon: Meredith Pel, MD;  Location: [place];  Service:  Orthopedics;  Laterality: Left;  . I&D EXTREMITY Right 01/17/2016   Procedure: IRRIGATION AND DEBRIDEMENT RIGHT FOOT;  Surgeon: Newt Minion, MD;  Location: [place];  Service: Orthopedics;  Laterality: Right;  . I&D EXTREMITY Right 01/21/2018   Procedure: IRRIGATION AND DEBRIDEMENT RIGHT FOOT;  Surgeon: Newt Minion, MD;  Location: [place];  Service: Orthopedics;  Laterality: Right;  . INCISION AND DRAINAGE FOOT Right 01/17/2016  . INGUINAL HERNIA REPAIR Bilateral ~ 1983- ~ 1986  . TONSILLECTOMY  ~ 22    Family History  Problem Relation Age of Onset  . Hypertension Mother   . Diabetes Mother   . Hyperlipidemia Mother   . Heart disease Mother        s/p pacemaker  . Diabetes Father   . Hypertension Father   . Stroke Father   . Heart attack Father        first MI @ 2.  . Depression Father   . Dementia Father   . Stroke Brother   . ADD / ADHD Brother   . Anxiety disorder Brother   . Bipolar disorder Brother   . OCD Brother   . Sexual abuse Brother     Social History   Socioeconomic History  . Marital status: Married    Spouse name: Not on file  . Number of children: 1  . Years of education: Not on file  . Highest education level: Not on file  Occupational History  . Occupation: disabled  Social Needs  . Financial resource strain: Not on file  . Food insecurity:    Worry: Never true    Inability: Never true  . Transportation needs:    Medical: No    Non-medical: No  Tobacco Use  . Smoking status: Never Smoker  . Smokeless tobacco: Never Used  Substance and Sexual Activity  . Alcohol use: No    Alcohol/week: 0.0 standard drinks  . Drug use: No  . Sexual activity: Not Currently  Lifestyle  . Physical activity:    Days per week: Not on file    Minutes per session: Not on file  . Stress: Not on file  Relationships  . Social connections:    Talks on phone: Not on file    Gets together: Not on file    Attends religious service: Not on file    Active member of  club or organization: Not on file    Attends meetings of clubs or organizations: Not on file    Relationship status: Not on file  . Intimate partner violence:    Fear of current or ex partner: Not on file    Emotionally abused: Not on file    Physically abused: Not on file    Forced sexual activity: Not on file  Other Topics Concern  . Not on file  Social History Narrative   Lives in [place] with wife.  Active but doesn't routinely exercise.  Physical Exam  Vital Signs and Nursing Notes reviewed Vitals:   03/02/18 1445 03/02/18 1500  BP: (!) 188/108 (!) 179/106  Pulse: (!) 104 (!) 103  Resp: 19 16  Temp:    SpO2: 100% 100%    CONSTITUTIONAL: Well-appearing, in moderate distress due to pain NEURO:  Alert and oriented x 3, no focal deficits EYES:  eyes equal and reactive ENT/NECK:  no LAD, no JVD CARDIO: Regular rate, well-perfused, normal S1 and S2 PULM:  CTAB no wheezing or rhonchi GI/GU:  normal bowel sounds, non-distended, moderate epigastric tenderness MSK/SPINE:  No gross deformities, no edema SKIN:  no rash, atraumatic PSYCH:  Appropriate speech and behavior  Diagnostic and Interventional Summary    EKG Interpretation  Date/Time:    Ventricular Rate:    PR Interval:    QRS Duration:   QT Interval:    QTC Calculation:   R Axis:     Text Interpretation:        Labs Reviewed  COMPREHENSIVE METABOLIC PANEL - Abnormal; Notable for the following components:      Result Value   Glucose, Bld 329 (*)    BUN 22 (*)    Creatinine, Ser 1.37 (*)    Calcium 8.6 (*)    Albumin 2.8 (*)    ALT 70 (*)    GFR calc non Af Amer 60 (*)    All other components within normal limits  CBC - Abnormal; Notable for the following components:   WBC 30.4 (*)    RBC 3.57 (*)    Hemoglobin 10.6 (*)    HCT 33.2 (*)    All other components within normal limits  URINALYSIS, ROUTINE W REFLEX MICROSCOPIC - Abnormal; Notable for the following components:   Color, Urine STRAW (*)     Glucose, UA >=500 (*)    Hgb urine dipstick SMALL (*)    Ketones, ur 20 (*)    Protein, ur 100 (*)    All other components within normal limits  I-STAT CG4 LACTIC ACID, ED - Abnormal; Notable for the following components:   Lactic Acid, Venous 3.26 (*)    All other components within normal limits  LIPASE, BLOOD  I-STAT TROPONIN, ED    CT ABDOMEN PELVIS W CONTRAST  Final Result      Medications  metroNIDAZOLE (FLAGYL) IVPB 500 mg (500 mg Intravenous New Bag/Given 03/02/18 1504)  ondansetron (ZOFRAN-ODT) disintegrating tablet 4 mg (4 mg Oral Given 03/02/18 0914)  HYDROmorphone (DILAUDID) injection 1 mg (1 mg Intravenous Given 03/02/18 1235)  sodium chloride 0.9 % bolus 1,000 mL (0 mLs Intravenous Stopped 03/02/18 1334)  ondansetron (ZOFRAN) injection 4 mg (4 mg Intravenous Given 03/02/18 1235)  famotidine (PEPCID) IVPB 20 mg premix (0 mg Intravenous Stopped 03/02/18 1308)  iohexol (OMNIPAQUE) 300 MG/ML solution 100 mL (100 mLs Intravenous Contrast Given 03/02/18 1255)  sodium chloride 0.9 % bolus 1,000 mL (0 mLs Intravenous Stopped 03/02/18 1441)  cefTRIAXone (ROCEPHIN) 2 g in sodium chloride 0.9 % 100 mL IVPB (0 g Intravenous Stopped 03/02/18 1413)     Procedures Critical Care  ED Course and Medical Decision Making  I have reviewed the triage vital signs and the nursing notes.  Pertinent labs & imaging results that were available during my care of the patient were reviewed by me and considered in my medical decision making (see below for details).  Persistent emesis followed by abdominal pain in this 47 year old male with poorly controlled diabetes, suspecting gastroparesis but cannot exclude focal surgical process  within the abdomen given the significant tenderness.  Labs, CT, symptomatic control, will reassess.   Labs reveal marked leukocytosis, given empiric ceftriaxone and Flagyl prior to CT scan.  Elevated lactate of 3, given 2 L crystalloid.  CT reveals no perforation but  significant gastritis with intramural ulcers.  Patient feeling better but still in pain, attempted to discuss case with gastroenterology, unable to make contact.  Patient admitted to hospital service for further care and evaluation.  Barth Kirks. Sedonia Small, [place] [email or web address]  Final Clinical Impressions(s) / ED Diagnoses     ICD-10-CM   1. Acute gastritis without hemorrhage, unspecified gastritis type K29.00   2. Acute gastric ulcer without hemorrhage or perforation K25.3     ED Discharge Orders    None         Maudie Flakes, MD 03/02/18 [id number]

## 2018-03-02 NOTE — H&P (Signed)
History and Physical    Shane [name] NWG:[id number] DOB: [birth year] DOA: 03/02/2018  PCP: Charlott Rakes, MD Consultants:  Sharol Given - orthopedics; [facility] - [place] Patient coming from:  Home - lives with wife and granddaughter; NOK: Wife, [phone]  Chief Complaint: Abdominal pain  HPI: Shane [name] is a 47 y.o. male with medical history significant of DM; CVA; Jehovah's witness; chronic diabetic foot ulcer s/p I&D on 8/7, recently completed 1 month of doxycycline, with subsequent R foot cellulitis requiring hospitalization through 9/8 and first ray amputation 9/13; lower GI bleed; HTN; HLD; bipolar; and chronic diastolic CHF presenting with gastritis.   He had surgery Friday on his right foot.  He began with n/v Saturday night and into Sunday.  He did have nausea even before d/c.  He awoke about 0200 today with severe abdominal pain.  It improved with water and then recurred about 0630.  It was intolerable and he couldn't take it - it was his worst pain ever.  The pain was right in the middle of his stomach, and moving positions did not help.  He has had ongoing intermittent n/v, maybe 6 episodes of emesis.  No blood in the emesis.  Last BM was Sunday AM and was normal.  He has been taking Tylenol only for pain.  No NSAIDs.  He had an EGD/colonoscopy in 8/17 with gastroparesis but no ulcers at that time.   ED Course:  Recent I&D.  D/c 9/8.  He had persistent n/v with severe epigastric pain this AM radiating into the center of his chest.  Pain, HTN on arrival.  Better with IV Dilaudid.  Elevated lactate, given antibiotics.  CT with marked gastritis with intramural ulceration.  GI consult pending.  Review of Systems: As per HPI; otherwise review of systems reviewed and negative.   Ambulatory Status:  He is currently non-ambulatory, in a wheelchair.  He anticipates that this will be long-term.  Past Medical History:  Diagnosis Date  . Anemia   . Asthma    as a child  . Bipolar  disorder ([place])    pt denies this   . Chest pain    a. 2015 Reportedly normal stress test in [place].  Marland Kitchen Chronic diastolic CHF (congestive heart failure) (HCC)    a.03/2015 Echo: EF 55-60%, Gr 1 DD, mild MR, triv PR.  . Depression   . Depression with anxiety   . Dyspnea    with exertion  . GERD (gastroesophageal reflux disease)   . Glaucoma   . Hyperlipidemia   . Hypertension    a. 08/2014 Admitted with hypertensive urgency.  . Insomnia   . Internal carotid artery stenosis   . Lower GI bleed 07/04/2015  . Neuropathy   . Refusal of blood transfusions as patient is Jehovah's Witness   . TIA (transient ischemic attack) 08/2014; 03/2015   a. 08/2014 in setting of hypertensive urgency.  . Type II diabetes mellitus ([place])    started insulin spring 2016, Type II  . Vitamin D deficiency spring 2016    Past Surgical History:  Procedure Laterality Date  . AMPUTATION Left 08/03/2015   Procedure: AMPUTATION LEFT GREAT TOE;  Surgeon: Newt Minion, MD;  Location: [facility];  Service: Orthopedics;  Laterality: Left;  . AMPUTATION Left 12/02/2015   Procedure: amputation of left 2nd digit  . AMPUTATION Left 04/10/2016   Procedure: Left 2nd Toe Amputation at MTP Joint;  Surgeon: Newt Minion, MD;  Location: [facility];  Service: Orthopedics;  Laterality: Left;  .  AMPUTATION Left 06/09/2016   Procedure: AMPUTATION LEFT THIRD TOE;  Surgeon: [name], MD;  Location: [facility];  Service: Orthopedics;  Laterality: Left;  . AMPUTATION Left 06/26/2016   Procedure: Left Foot Transmetatarsal Amputation;  Surgeon: Newt Minion, MD;  Location: [facility];  Service: Orthopedics;  Laterality: Left;  . AMPUTATION Right 02/27/2018   Procedure: RIGHT FOOT 1ST RAY AMPUTATION;  Surgeon: Newt Minion, MD;  Location: [place] [facility];  Service: Orthopedics;  Laterality: Right;  . APPLICATION OF WOUND VAC Left 12/19/2015   Procedure: APPLICATION OF WOUND VAC;  Surgeon: Meredith Pel, MD;  Location: [facility];  Service: Orthopedics;  Laterality:  Left;  . CIRCUMCISION    . COLONOSCOPY N/A 01/22/2016   Procedure: COLONOSCOPY;  Surgeon: Gatha Mayer, MD;  Location: [facility];  Service: Endoscopy;  Laterality: N/A;  . ESOPHAGOGASTRODUODENOSCOPY N/A 01/19/2016   Procedure: ESOPHAGOGASTRODUODENOSCOPY (EGD);  Surgeon: Manus Gunning, MD;  Location: [facility];  Service: Gastroenterology;  Laterality: N/A;  . ESOPHAGOGASTRODUODENOSCOPY N/A 01/22/2016   Procedure: ESOPHAGOGASTRODUODENOSCOPY (EGD);  Surgeon: Gatha Mayer, MD;  Location: [facility] ENDOSCOPY;  Service: Endoscopy;  Laterality: N/A;  . I&D EXTREMITY Left 12/02/2015   Procedure: IRRIGATION AND DEBRIDEMENT OF FOOT; LEFT SECOND TOE AMPUTATION;  Surgeon: Meredith Pel, MD;  Location: [facility];  Service: Orthopedics;  Laterality: Left;  . I&D EXTREMITY Left 12/19/2015   Procedure: I & D LEFT FOOT WITH BEADS ;  Surgeon: Meredith Pel, MD;  Location: [facility];  Service: Orthopedics;  Laterality: Left;  . I&D EXTREMITY Right 01/17/2016   Procedure: IRRIGATION AND DEBRIDEMENT RIGHT FOOT;  Surgeon: Newt Minion, MD;  Location: [place] [facility];  Service: Orthopedics;  Laterality: Right;  . I&D EXTREMITY Right 01/21/2018   Procedure: IRRIGATION AND DEBRIDEMENT RIGHT FOOT;  Surgeon: Newt Minion, MD;  Location: [facility];  Service: Orthopedics;  Laterality: Right;  . INCISION AND DRAINAGE FOOT Right 01/17/2016  . INGUINAL HERNIA REPAIR Bilateral ~ 1983- ~ 1986  . TONSILLECTOMY  ~ 96    Social History   Socioeconomic History  . Marital status: Married    Spouse name: Not on file  . Number of children: 1  . Years of education: Not on file  . Highest education level: Not on file  Occupational History  . Occupation: disabled  Social Needs  . Financial resource strain: Not on file  . Food insecurity:    Worry: Never true    Inability: Never true  . Transportation needs:    Medical: No    Non-medical: No  Tobacco Use  . Smoking status: Never Smoker  . Smokeless tobacco: Never Used    Substance and Sexual Activity  . Alcohol use: No    Alcohol/week: 0.0 standard drinks  . Drug use: No  . Sexual activity: Not Currently  Lifestyle  . Physical activity:    Days per week: Not on file    Minutes per session: Not on file  . Stress: Not on file  Relationships  . Social connections:    Talks on phone: Not on file    Gets together: Not on file    Attends religious service: Not on file    Active member of club or organization: Not on file    Attends meetings of clubs or organizations: Not on file    Relationship status: Not on file  . Intimate partner violence:    Fear of current or ex partner: Not on file    Emotionally  abused: Not on file    Physically abused: Not on file    Forced sexual activity: Not on file  Other Topics Concern  . Not on file  Social History Narrative   Lives in [place] with wife.  Active but doesn't routinely exercise.    Allergies  Allergen Reactions  . Other Diarrhea and Other (See Comments)    Red meat causes stomach pains, bloating and diarrhea  . Lactose Intolerance (Gi) Diarrhea and Other (See Comments)    Bloating     Family History  Problem Relation Age of Onset  . Hypertension Mother   . Diabetes Mother   . Hyperlipidemia Mother   . Heart disease Mother        s/p pacemaker  . Diabetes Father   . Hypertension Father   . Stroke Father   . Heart attack Father        first MI @ 68.  . Depression Father   . Dementia Father   . Stroke Brother   . ADD / ADHD Brother   . Anxiety disorder Brother   . Bipolar disorder Brother   . OCD Brother   . Sexual abuse Brother     Prior to Admission medications   Medication Sig Start Date End Date Taking? Authorizing Provider  ACCU-CHEK FASTCLIX LANCETS MISC Use as directed three times daily 02/27/18   Charlott Rakes, MD  amLODipine (NORVASC) 5 MG tablet Take 1 tablet (5 mg total) by mouth daily. 02/02/18   Ladell Pier, MD  aspirin EC 81 MG tablet Take 1 tablet (81 mg total) by  mouth daily. 09/16/16   Funches, Adriana Mccallum, MD  atorvastatin (LIPITOR) 80 MG tablet Take 1 tablet (80 mg total) by mouth every morning. 03/03/17   Charlott Rakes, MD  Blood Glucose Monitoring Suppl (ACCU-CHEK AVIVA PLUS) w/Device KIT Use as directed three times daily 02/27/18   Charlott Rakes, MD  carbamazepine (TEGRETOL) 200 MG tablet TAKE 1 TABLET BY MOUTH AT BEDTIME. Patient taking differently: Take 200 mg by mouth at bedtime.  01/01/18   Eksir, Richard Miu, MD  carvedilol (COREG) 25 MG tablet Take 1 tablet (25 mg total) by mouth 2 (two) times daily with a meal. Patient taking differently: Take 25 mg by mouth daily.  11/20/17   Charlott Rakes, MD  FLUoxetine (PROZAC) 40 MG capsule Take 1 capsule (40 mg total) by mouth daily. 08/06/17   Eksir, Richard Miu, MD  gabapentin (NEURONTIN) 300 MG capsule Take 1 capsule (300 mg total) by mouth 2 (two) times daily. Patient taking differently: Take 300 mg by mouth at bedtime.  11/20/17   Charlott Rakes, MD  glipiZIDE (GLUCOTROL XL) 10 MG 24 hr tablet Take 1 tablet (10 mg total) by mouth daily with breakfast. 11/20/17   Charlott Rakes, MD  glucose blood (ACCU-CHEK AVIVA PLUS) test strip Use as instructed three times daily 02/27/18   Charlott Rakes, MD  HYDROcodone-acetaminophen (NORCO/VICODIN) 5-325 MG tablet Take 1 tablet by mouth every 4 (four) hours as needed for moderate pain. 02/27/18   Newt Minion, MD  Insulin Glargine (LANTUS SOLOSTAR) 100 UNIT/ML Solostar Pen Inject 35 Units into the skin 2 (two) times daily. Patient taking differently: Inject 46 Units into the skin at bedtime.  11/20/17   Charlott Rakes, MD  lisinopril (PRINIVIL,ZESTRIL) 10 MG tablet Take 1 tablet (10 mg total) by mouth daily. 02/26/18   Charlott Rakes, MD  methocarbamol (ROBAXIN) 500 MG tablet Take 1 tablet (500 mg total) by mouth every 6 (six)  hours as needed for muscle spasms. 01/23/18   Rayburn, Neta Mends, PA-C  pantoprazole (PROTONIX) 40 MG tablet Take 1 tablet (40 mg total)  by mouth daily. 12/30/17   Charlott Rakes, MD  polyethylene glycol (MIRALAX / GLYCOLAX) packet Take 17 g by mouth daily as needed for moderate constipation. 07/03/16   Asencion Partridge, MD  senna-docusate (SENOKOT-S) 8.6-50 MG tablet Take 1 tablet by mouth at bedtime. Patient taking differently: Take 1 tablet by mouth at bedtime as needed for mild constipation.  07/03/16   Asencion Partridge, MD  sucralfate (CARAFATE) 1 g tablet TAKE 1 TABLET (1 G TOTAL) BY MOUTH 4 (FOUR) TIMES DAILY - WITH MEALS AND AT BEDTIME. Patient taking differently: Take 1 g by mouth 2 (two) times daily.  11/24/17   Esterwood, Amy S, PA-C  tamsulosin (FLOMAX) 0.4 MG CAPS capsule Take 1 capsule (0.4 mg total) by mouth daily. 02/26/18   Charlott Rakes, MD  traZODone (DESYREL) 100 MG tablet Take 1 tablet (100 mg total) by mouth at bedtime. 01/13/18   Steve Rattler, DO    Physical Exam: Vitals:   03/02/18 1415 03/02/18 1430 03/02/18 1445 03/02/18 1500  BP: (!) 188/108 (!) 187/111 (!) 188/108 (!) 179/106  Pulse: (!) 102 (!) 105 (!) 104 (!) 103  Resp: 20 (!) 30 19 16   Temp:      TempSrc:      SpO2: 100% 100% 100% 100%     General:  Appears calm and comfortable and is NAD Eyes:  PERRL, EOMI, normal lids, iris ENT:  grossly normal hearing, lips & tongue, mmm; appropriate dentition Neck:  no LAD, masses or thyromegaly; no carotid bruits Cardiovascular:  Tachycardia, no m/r/g. No LE edema.  Respiratory:   CTA bilaterally with no wheezes/rales/rhonchi.  Normal respiratory effort. Abdomen:  Soft, marked midepigastric TTP, ND, NABS Skin:  no rash or induration seen on limited exam Musculoskeletal:  grossly normal tone BUE/BLE, good ROM, no bony abnormality other than L transmetatarsal amputation and R first ray amputation Psychiatric:  grossly normal mood and affect, speech fluent and appropriate, AOx3 Neurologic:  CN 2-12 grossly intact, moves all extremities in coordinated fashion, sensation intact    Radiological Exams on  Admission: Ct Abdomen Pelvis W Contrast  Result Date: 03/02/2018 CLINICAL DATA:  Abdominal pain. EXAM: CT ABDOMEN AND PELVIS WITH CONTRAST TECHNIQUE: Multidetector CT imaging of the abdomen and pelvis was performed using the standard protocol following bolus administration of intravenous contrast. CONTRAST:  18m OMNIPAQUE IOHEXOL 300 MG/ML  SOLN COMPARISON:  CT chest 01/24/2018. FINDINGS: Lower chest: Small left pleural effusion. Subsegmental atelectasis noted in the left lower lobe. Hepatobiliary: No focal liver abnormality is seen. No gallstones, gallbladder wall thickening, or biliary dilatation. Pancreas: Unremarkable. No pancreatic ductal dilatation or surrounding inflammatory changes. Spleen: Normal in size without focal abnormality. Adrenals/Urinary Tract: Normal adrenal glands. Bilateral perinephric fat stranding identified. No mass or hydronephrosis identified. Urinary bladder appears normal. Stomach/Bowel: The stomach is diffusely abnormal in appearance with marked diffuse gastric wall edema and Peri cast strict fat stranding. Areas of intramural ulceration containing gas is identified along the lesser curvature of the gastric fundus and gastric cardia, image 58/6 and image 72/6. No evidence for gastric perforation identified. No significant wall thickening involving the duodenum. No abnormal small bowel wall thickening or inflammation. No pathologic dilatation of the colon. Vascular/Lymphatic: Aortic atherosclerosis. No aneurysm. No upper abdominal adenopathy identified. No pelvic or inguinal adenopathy. Reproductive: Prostate gland and seminal vesicles are unremarkable. Other: No significant free fluid  or fluid collections. Musculoskeletal: No acute or significant osseous findings. IMPRESSION: 1. Marked diffuse gastric wall edema is identified with areas of intramural ulceration in the fundus and gastric cardia. Findings compatible with gastritis. No evidence for perforation. No pneumoperitoneum  identified. 2. Mild bilateral perinephric fat stranding identified without hydronephrosis, nonspecific but may be related to prior insult. 3.  Aortic Atherosclerosis (ICD10-I70.0). 4. Small left pleural effusion and left lower lobe subsegmental atelectasis. Electronically Signed   By: Kerby Moors M.D.   On: 03/02/2018 13:24    EKG: Independently reviewed.  Sinus tachycardia with rate 103; nonspecific ST changes    Labs on Admission: I have personally reviewed the available labs and imaging studies at the time of the admission.  Pertinent labs:   Glucose 329 BUN 22/Creatinine 1.37/GFR 60 - stable Albumin 2.8 AST 40/ALT 70 Troponin 0.00 Lactate 3.26 WBC 30.4; 7.1 on 9/7 Hgb 10.6 - stable UA: small Hgb, 20 ketones, 100 protein, >500 glucose   Assessment/Plan Principal Problem:   Sepsis ([place]) Active Problems:   HLD (hyperlipidemia)   Essential hypertension   Diabetes mellitus type 2, uncontrolled, with complications (HCC)   Chronic diastolic CHF (congestive heart failure), NYHA class 1 (HCC)   CKD (chronic kidney disease) stage 3, GFR 30-59 ml/min (HCC)   Gastritis   Sepsis -SIRS criteria in this patient includes: Marked leukocytosis, tachycardia, tachypnea, and fever  -Patient has evidence of acute organ failure with elevated lactate -While awaiting blood cultures, this appears to be a preseptic condition. -Sepsis protocol initiated -Uncertain source, possible intraabdominal (see below) -UA is unremarkable -CXR is pending -Blood and urine cultures pending -Will admit with telemetry and continue to monitor -Treat with IV Rocephin and Flagyl for presumed intraabdominal source -He did grow pansensitive Staph from his foot culture on 9/4; if not responding, consider addition of Vanc -HIV negative on 08/08/17 -Will trend lactate to ensure improvement -Will order lower respiratory tract procalcitonin level.  Antibiotics would not be indicated for PCT <0.1 and probably should  not be used for < 0.25.  >0.5 indicates infection and >>0.5 indicates more serious disease.  As the procalcitonin level normalizes, it will be reasonable to consider de-escalation of antibiotic coverage.  Gastritis -Patient presented with severe midepigastric abdominal pain -CT showed marked diffuse gastric wall edema with intramural ulceration in the fundus and gastric cardia c/w gastritis, no perforation -Will give clear liquids; carafate; and IV H2 blocker (can't order IV PPI) -GI consult is pending (ER physician was waiting for a call back)  DM -A1c on 9/6 was 7.3 -Continue Lantus -Hold Glucotrol -Cover with moderate-scale SSI  HTN -Continue home medications - Norvasc, Coreg -Hold Lisinopril  HLD -Continue Lipitor  Stage 3 CKD -Appears to be stable at this time -Will recheck BMP in AM   DVT prophylaxis: Lovenox  Code Status:  Full - confirmed with patient Family Communication: None present  Disposition Plan:  Home once clinically improved Consults called: GI  Admission status: Admit - It is my clinical opinion that admission to [facility] is reasonable and necessary because of the expectation that this patient will require hospital care that crosses at least 2 midnights to treat this condition based on the medical complexity of the problems presented.  Given the aforementioned information, the predictability of an adverse outcome is felt to be significant.     Karmen Bongo MD [facility]  If note is complete, please contact covering daytime or nighttime physician. [email or web address] Password [email or web address]  03/02/2018, 4:07 PM

## 2018-03-03 ENCOUNTER — Encounter (HOSPITAL_COMMUNITY): Payer: Self-pay | Admitting: Internal Medicine

## 2018-03-03 ENCOUNTER — Other Ambulatory Visit: Payer: Self-pay

## 2018-03-03 DIAGNOSIS — E1165 Type 2 diabetes mellitus with hyperglycemia: Secondary | ICD-10-CM

## 2018-03-03 DIAGNOSIS — E118 Type 2 diabetes mellitus with unspecified complications: Secondary | ICD-10-CM

## 2018-03-03 DIAGNOSIS — N183 Chronic kidney disease, stage 3 (moderate): Secondary | ICD-10-CM

## 2018-03-03 DIAGNOSIS — R1013 Epigastric pain: Secondary | ICD-10-CM | POA: Diagnosis present

## 2018-03-03 DIAGNOSIS — K253 Acute gastric ulcer without hemorrhage or perforation: Secondary | ICD-10-CM

## 2018-03-03 DIAGNOSIS — I5032 Chronic diastolic (congestive) heart failure: Secondary | ICD-10-CM

## 2018-03-03 LAB — GLUCOSE, CAPILLARY
GLUCOSE-CAPILLARY: 138 mg/dL — AB (ref 70–99)
GLUCOSE-CAPILLARY: 103 mg/dL — AB (ref 70–99)
GLUCOSE-CAPILLARY: 125 mg/dL — AB (ref 70–99)
Glucose-Capillary: 203 mg/dL — ABNORMAL HIGH (ref 70–99)

## 2018-03-03 LAB — LACTIC ACID, PLASMA: LACTIC ACID, VENOUS: 1.1 mmol/L (ref 0.5–1.9)

## 2018-03-03 LAB — COMPREHENSIVE METABOLIC PANEL
ALK PHOS: 55 U/L (ref 38–126)
ALT: 44 U/L (ref 0–44)
ANION GAP: 9 (ref 5–15)
AST: 19 U/L (ref 15–41)
Albumin: 2.6 g/dL — ABNORMAL LOW (ref 3.5–5.0)
BILIRUBIN TOTAL: 0.4 mg/dL (ref 0.3–1.2)
BUN: 14 mg/dL (ref 6–20)
CHLORIDE: 100 mmol/L (ref 98–111)
CO2: 28 mmol/L (ref 22–32)
CREATININE: 1.04 mg/dL (ref 0.61–1.24)
Calcium: 8.4 mg/dL — ABNORMAL LOW (ref 8.9–10.3)
GFR calc non Af Amer: >60 mL/min (ref >60)
GLUCOSE: 223 mg/dL — AB (ref 70–99)
Potassium: 3.7 mmol/L (ref 3.5–5.1)
Sodium: 137 mmol/L (ref 135–145)
Total Protein: 6.6 g/dL (ref 6.5–8.1)

## 2018-03-03 LAB — CBC
HEMATOCRIT: 29.9 % — AB (ref 39.0–52.0)
HEMOGLOBIN: 9.6 g/dL — AB (ref 13.0–17.0)
MCH: 29.7 pg (ref 26.0–34.0)
MCHC: 32.1 g/dL (ref 30.0–36.0)
MCV: 92.6 fL (ref 78.0–100.0)
Platelets: 274 10*3/uL (ref 150–400)
RBC: 3.23 MIL/uL — ABNORMAL LOW (ref 4.22–5.81)
RDW: 13 % (ref 11.5–15.5)
WBC: 29.9 10*3/uL — ABNORMAL HIGH (ref 4.0–10.5)

## 2018-03-03 LAB — MAGNESIUM: Magnesium: 1.6 mg/dL — ABNORMAL LOW (ref 1.7–2.4)

## 2018-03-03 LAB — LIPASE, BLOOD: LIPASE: 20 U/L (ref 11–51)

## 2018-03-03 MED ORDER — KCL IN DEXTROSE-NACL 20-5-0.9 MEQ/L-%-% IV SOLN
INTRAVENOUS | Status: DC
  Administered 2018-03-03 – 2018-03-04 (×4): via INTRAVENOUS
  Filled 2018-03-03 (×4): qty 1000

## 2018-03-03 MED ORDER — HYDRALAZINE HCL 50 MG PO TABS
50.0000 mg | ORAL_TABLET | Freq: Three times a day (TID) | ORAL | Status: DC
  Administered 2018-03-03 – 2018-03-10 (×20): 50 mg via ORAL
  Filled 2018-03-03 (×21): qty 1

## 2018-03-03 MED ORDER — GI COCKTAIL ~~LOC~~
30.0000 mL | Freq: Three times a day (TID) | ORAL | Status: DC
  Administered 2018-03-03 – 2018-03-08 (×16): 30 mL via ORAL
  Filled 2018-03-03 (×16): qty 30

## 2018-03-03 MED ORDER — HYDRALAZINE HCL 20 MG/ML IJ SOLN
10.0000 mg | Freq: Four times a day (QID) | INTRAMUSCULAR | Status: DC | PRN
  Administered 2018-03-06 – 2018-03-09 (×4): 10 mg via INTRAVENOUS
  Filled 2018-03-03 (×4): qty 1

## 2018-03-03 MED ORDER — MAGNESIUM SULFATE 2 GM/50ML IV SOLN
2.0000 g | Freq: Once | INTRAVENOUS | Status: AC
  Administered 2018-03-03: 2 g via INTRAVENOUS
  Filled 2018-03-03: qty 50

## 2018-03-03 MED ORDER — PANTOPRAZOLE SODIUM 40 MG PO TBEC
40.0000 mg | DELAYED_RELEASE_TABLET | Freq: Two times a day (BID) | ORAL | Status: DC
  Administered 2018-03-03: 40 mg via ORAL
  Filled 2018-03-03: qty 1

## 2018-03-03 MED ORDER — INSULIN GLARGINE 100 UNIT/ML ~~LOC~~ SOLN
20.0000 [IU] | Freq: Two times a day (BID) | SUBCUTANEOUS | Status: DC
  Administered 2018-03-03 – 2018-03-09 (×10): 20 [IU] via SUBCUTANEOUS
  Filled 2018-03-03 (×12): qty 0.2

## 2018-03-03 MED ORDER — PANTOPRAZOLE SODIUM 40 MG IV SOLR
40.0000 mg | Freq: Two times a day (BID) | INTRAVENOUS | Status: DC
  Administered 2018-03-03 – 2018-03-08 (×11): 40 mg via INTRAVENOUS
  Filled 2018-03-03 (×11): qty 40

## 2018-03-03 MED ORDER — HYDROMORPHONE HCL 1 MG/ML IJ SOLN
1.0000 mg | INTRAMUSCULAR | Status: DC | PRN
  Administered 2018-03-03 – 2018-03-07 (×24): 1 mg via INTRAVENOUS
  Filled 2018-03-03 (×24): qty 1

## 2018-03-03 NOTE — Consult Note (Addendum)
[facility] Consult: 10:45 AM 03/03/2018  LOS: 1 day    Referring Provider: Dr Candiss Norse  Primary Care Physician:  Shane Rakes, MD Primary Gastroenterologist:  Dr. Havery Moros.      Reason for Consultation:  Gastric ulcers     IMPRESSION:   *   Sepsis in pt with acute epigastric pain, nausea vomiting.  CT reveals gastritis/gastric ulceration.  Patient is compliant with daily PPI.  EGD 2 years ago with normal gastric mucosa, retained gastric contents. Empiric metronidazole and Rocephin in place.    PLAN:     *  ?  EGD.  Dr Carlean Purl will be seeing pt.    *   Asked patient to refrain from eating the clear liquid tray until we can sort out whether or not EGD will be performed today.  *   Should we hold the Lovenox?Marland Kitchen  *   Continue Protonix 40 mg p.o. twice daily.  Discontinued the IV famotidine. Continue abx.     [name]  03/03/2018, 9:06 AM Phone [place] Attending   I have taken an interval history, reviewed the chart and examined the patient. I agree with the Advanced Practitioner's note, impression and recommendations.   Severe acute epigastric pain after amputation Abnormal stomach - diffusely edematous and "gas" - ulceration Leukocytosis/SIRS  I think he had an acute embolic/ischemic event(s) after the amputation.  Plan  NPO x meds/chips IV PPI Stay on Abx Acute abd series tomorrow If deterioiates surgery to see No po analgesics Dilaudid 1 mg q 3 prn  Hopefully he will heal after this - I do not think an EGD will change plans and he could perforate if I did one  Will follow  Gatha Mayer, MD, [facility] 03/03/2018 10:44 AM [phone]       HPI: Shane [name] is a 47 y.o. male.  PMH Htn.  CHF.  IDDM.  CKD stage 3.   ASPVD, s/p left foot amputation. Chronic diabetic right foot ulcer.    Metformin induced diarrhea. Lactose  Intolerance.  Bipolar disorder.    01/19/2016 EGD: for IDA.  Normal study except for retained food in stomach s/o gastroparesis.  01/22/16 EGD #2, for hematochezia.  Normal study   01/22/16 Colonoscopy.  Int rrhoids, O/w normal.  No blood present.    S/p I&D of right foot ulcer on 8/7.  Followed with 1 month doxycylcline.  Then recurrent infection leading to ray amputation 9/13.  Was given a prescription for Vicodin which he never filled.  He was using Tylenol at home for pain management post surgery. At home post surgery developed N/V (never bloody or CG) and severe epigastric abdominal pain "worst ever".   + Sweats.  Persisted through yesterday and came to the ED.  No fevers.  When the pain is severe is hard for him to 3. Takes Protonix 40 mg daily.   Admitted last night.  No fevers.  BP to 238/116,  heart rate up to 101. WBCs 29.9 K.  Hgb 9.6.  LFTs with elevated ALT at 70 otherwise normal.  GFR >60.  Low mag 1.6, O/w normal electrolytes. Elevated lactic acid has resolved. CT ab/pelvis: edema and ulceration in gastric fundus and cardia.  Bil perinephric stranding, query prior insult.  Small left pleural effusion and atx.  Aortic atherosclerosis.      Past Medical History:  Diagnosis Date  . Anemia   . Asthma    as a child  . Bipolar disorder ([place])    pt denies this   . Chest pain    a. 2015 Reportedly normal stress test in [place].  Marland Kitchen Chronic diastolic CHF (congestive heart failure) (HCC)    a.03/2015 Echo: EF 55-60%, Gr 1 DD, mild MR, triv PR.  . Depression   . Depression with anxiety   . Dyspnea    with exertion  . GERD (gastroesophageal reflux disease)   . Glaucoma   . Hyperlipidemia   . Hypertension    a. 08/2014 Admitted with hypertensive urgency.  . Insomnia   . Internal carotid artery stenosis   . Lower GI bleed 07/04/2015  . Neuropathy   . Refusal of blood transfusions as  patient is Jehovah's Witness   . TIA (transient ischemic attack) 08/2014; 03/2015   a. 08/2014 in setting of hypertensive urgency.  . Type II diabetes mellitus ([place])    started insulin spring 2016, Type II  . Vitamin D deficiency spring 2016    Past Surgical History:  Procedure Laterality Date  . AMPUTATION Left 08/03/2015   Procedure: AMPUTATION LEFT GREAT TOE;  Surgeon: Newt Minion, MD;  Location: [place];  Service: Orthopedics;  Laterality: Left;  . AMPUTATION Left 12/02/2015   Procedure: amputation of left 2nd digit  . AMPUTATION Left 04/10/2016   Procedure: Left 2nd Toe Amputation at MTP Joint;  Surgeon: Newt Minion, MD;  Location: [place];  Service: Orthopedics;  Laterality: Left;  . AMPUTATION Left 06/09/2016   Procedure: AMPUTATION LEFT THIRD TOE;  Surgeon: [name], MD;  Location: [place];  Service: Orthopedics;  Laterality: Left;  . AMPUTATION Left 06/26/2016   Procedure: Left Foot Transmetatarsal Amputation;  Surgeon: Newt Minion, MD;  Location: [place];  Service: Orthopedics;  Laterality: Left;  . AMPUTATION Right 02/27/2018   Procedure: RIGHT FOOT 1ST RAY AMPUTATION;  Surgeon: Newt Minion, MD;  Location: [place];  Service: Orthopedics;  Laterality: Right;  . APPLICATION OF WOUND VAC Left 12/19/2015   Procedure: APPLICATION OF WOUND VAC;  Surgeon: Meredith Pel, MD;  Location: [place];  Service: Orthopedics;  Laterality: Left;  . CIRCUMCISION    . COLONOSCOPY N/A 01/22/2016   Procedure: COLONOSCOPY;  Surgeon: Gatha Mayer, MD;  Location: [place];  Service: Endoscopy;  Laterality: N/A;  . ESOPHAGOGASTRODUODENOSCOPY N/A 01/19/2016   Procedure: ESOPHAGOGASTRODUODENOSCOPY (EGD);  Surgeon: Manus Gunning, MD;  Location: [place];  Service: Gastroenterology;  Laterality: N/A;  . ESOPHAGOGASTRODUODENOSCOPY N/A 01/22/2016   Procedure: ESOPHAGOGASTRODUODENOSCOPY (EGD);  Surgeon: Gatha Mayer, MD;  Location[facility] END[facility]COPY;  Service: Endoscopy;  Laterality: N/A;  . I&D  EXTREMITY Left 12/02/2015   Procedure: IRRIGATION AND DEBRIDEMENT OF FOOT; LEFT SECOND TOE AMPUTATION;  Surgeon: Meredith Pel, MD;  Location: [place];  Service: Orthopedics;  Laterality: Left;  . I&D EXTREMITY Left 12/19/2015   Procedure: I & D LEFT FOOT WITH BEADS ;  Surgeon: Meredith Pel, MD;  Location: [place];  Service: Orthopedics;  Laterality: Left;  . I&D EXTREMITY Right 01/17/2016   Procedure:  IRRIGATION AND DEBRIDEMENT RIGHT FOOT;  Surgeon: Newt Minion, MD;  Location: [place];  Service: Orthopedics;  Laterality: Right;  . I&D EXTREMITY Right 01/21/2018   Procedure: IRRIGATION AND DEBRIDEMENT RIGHT FOOT;  Surgeon: Newt Minion, MD;  Location: [place];  Service: Orthopedics;  Laterality: Right;  . INCISION AND DRAINAGE FOOT Right 01/17/2016  . INGUINAL HERNIA REPAIR Bilateral ~ 1983- ~ 1986  . TONSILLECTOMY  ~ 1985    Prior to Admission medications   Medication Sig Start Date End Date Taking? Authorizing Provider  acetaminophen (TYLENOL) 500 MG tablet Take 500 mg by mouth every 6 (six) hours as needed for headache (pain).   Yes [provider]  amLODipine (NORVASC) 5 MG tablet Take 1 tablet (5 mg total) by mouth daily. 02/02/18  Yes Ladell Pier, MD  aspirin EC 81 MG tablet Take 1 tablet (81 mg total) by mouth daily. 09/16/16  Yes Funches, Josalyn, MD  carbamazepine (TEGRETOL) 200 MG tablet TAKE 1 TABLET BY MOUTH AT BEDTIME. Patient taking differently: Take 200 mg by mouth at bedtime.  01/01/18  Yes Eksir, Richard Miu, MD  carvedilol (COREG) 25 MG tablet Take 1 tablet (25 mg total) by mouth 2 (two) times daily with a meal. Patient taking differently: Take 25 mg by mouth daily.  11/20/17  Yes Shane Rakes, MD  FLUoxetine (PROZAC) 40 MG capsule Take 1 capsule (40 mg total) by mouth daily. 08/06/17  Yes Eksir, Richard Miu, MD  gabapentin (NEURONTIN) 300 MG capsule Take 1 capsule (300 mg total) by mouth 2 (two) times daily. Patient taking differently: Take 300 mg by mouth  at bedtime.  11/20/17  Yes Shane Rakes, MD  glipiZIDE (GLUCOTROL XL) 10 MG 24 hr tablet Take 1 tablet (10 mg total) by mouth daily with breakfast. 11/20/17  Yes Newlin, Enobong, MD  Insulin Glargine (LANTUS SOLOSTAR) 100 UNIT/ML Solostar Pen Inject 35 Units into the skin 2 (two) times daily. Patient taking differently: Inject 46 Units into the skin at bedtime.  11/20/17  Yes Shane Rakes, MD  lisinopril (PRINIVIL,ZESTRIL) 10 MG tablet Take 1 tablet (10 mg total) by mouth daily. 02/26/18  Yes Shane Rakes, MD  methocarbamol (ROBAXIN) 500 MG tablet Take 1 tablet (500 mg total) by mouth every 6 (six) hours as needed for muscle spasms. 01/23/18  Yes Rayburn, Neta Mends, PA-C  pantoprazole (PROTONIX) 40 MG tablet Take 1 tablet (40 mg total) by mouth daily. 12/30/17  Yes Shane Rakes, MD  polyethylene glycol (MIRALAX / GLYCOLAX) packet Take 17 g by mouth daily as needed for moderate constipation. 07/03/16  Yes Asencion Partridge, MD  senna-docusate (SENOKOT-S) 8.6-50 MG tablet Take 1 tablet by mouth at bedtime. Patient taking differently: Take 1 tablet by mouth at bedtime as needed for mild constipation.  07/03/16  Yes Asencion Partridge, MD  sucralfate (CARAFATE) 1 g tablet TAKE 1 TABLET (1 G TOTAL) BY MOUTH 4 (FOUR) TIMES DAILY - WITH MEALS AND AT BEDTIME. Patient taking differently: Take 1 g by mouth 2 (two) times daily with a meal.  11/24/17  Yes Esterwood, Amy S, PA-C  tamsulosin (FLOMAX) 0.4 MG CAPS capsule Take 1 capsule (0.4 mg total) by mouth daily. 02/26/18  Yes Shane Rakes, MD  traZODone (DESYREL) 100 MG tablet Take 1 tablet (100 mg total) by mouth at bedtime. 01/13/18  Yes Steve Rattler, DO  ACCU-CHEK FASTCLIX LANCETS MISC Use as directed three times daily 02/27/18   Shane Rakes, MD  atorvastatin (LIPITOR) 80 MG tablet Take 1 tablet (  80 mg total) by mouth every morning. Patient not taking: Reported on 03/02/2018 03/03/17   Shane Rakes, MD  Blood Glucose Monitoring Suppl (ACCU-CHEK AVIVA  PLUS) w/Device KIT Use as directed three times daily 02/27/18   Shane Rakes, MD  glucose blood (ACCU-CHEK AVIVA PLUS) test strip Use as instructed three times daily 02/27/18   Shane Rakes, MD  HYDROcodone-acetaminophen (NORCO/VICODIN) 5-325 MG tablet Take 1 tablet by mouth every 4 (four) hours as needed for moderate pain. 02/27/18   Newt Minion, MD    Scheduled Meds: . amLODipine  5 mg Oral Daily  . atorvastatin  80 mg Oral q morning - 10a  . carbamazepine  200 mg Oral QHS  . carvedilol  25 mg Oral BID WC  . docusate sodium  100 mg Oral BID  . enoxaparin (LOVENOX) injection  40 mg Subcutaneous Q24H  . FLUoxetine  40 mg Oral Daily  . gabapentin  300 mg Oral QHS  . gi cocktail  30 mL Oral TID  . insulin aspart  0-15 Units Subcutaneous TID WC  . insulin aspart  0-5 Units Subcutaneous QHS  . insulin glargine  46 Units Subcutaneous QHS  . pantoprazole (PROTONIX) IV  40 mg Intravenous Q12H  . sodium chloride flush  3 mL Intravenous Q12H  . sucralfate  1 g Oral TID WC & HS  . tamsulosin  0.4 mg Oral Daily  . traZODone  100 mg Oral QHS   Infusions: . cefTRIAXone (ROCEPHIN)  IV    . dextrose 5 % and 0.9 % NaCl with KCl 20 mEq/L    . metronidazole 100 mL/hr at 03/03/18 0600   PRN Meds: HYDROmorphone (DILAUDID) injection, [DISCONTINUED] ondansetron **OR** ondansetron (ZOFRAN) IV   Allergies as of 03/02/2018 - Review Complete 03/02/2018  Allergen Reaction Noted  . Other Diarrhea and Other (See Comments) 07/15/2015  . Lactose intolerance (gi) Diarrhea and Other (See Comments) 06/08/2016   Social History   Social History Narrative   Disabled   Lives in [place] with wife.  Active but doesn't routinely exercise.   Never Smoker, no EtOH/substances   1 child    Family History  Problem Relation Age of Onset  . Hypertension Mother   . Diabetes Mother   . Hyperlipidemia Mother   . Heart disease Mother        s/p pacemaker  . Diabetes Father   . Hypertension Father   . Stroke  Father   . Heart attack Father        first MI @ 32.  . Depression Father   . Dementia Father   . Stroke Brother   . ADD / ADHD Brother   . Anxiety disorder Brother   . Bipolar disorder Brother   . OCD Brother   . Sexual abuse Brother       REVIEW OF SYSTEMS: Constitutional: Up until recent events he felt well.  No weakness or fatigue. ENT:  No nose bleeds Pulm: Difficulty breathing or cough. CV:  No palpitations, no LE edema.  No chest pain.   GU:  No oliguria, no hematuria, no frequency GI:  Per HPI Heme: Denies excessive bleeding or bruising. Transfusions: Previous blood transfusions. Neuro:  No headaches, no peripheral tingling or numbness.  No tremors.  No seizures. MS: Some postoperative pain in his foot. Derm:  No itching, no rash or sores.  Endocrine:  No sweats or chills.  No polyuria or dysuria Immunization: Not queried. Travel:  None beyond local counties in last few  months.    PHYSICAL EXAM: Vital signs in last 24 hours: Vitals:   03/03/18 0450 03/03/18 0933  BP: (!) 187/103 (!) 182/99  Pulse: 92   Resp: 20   Temp: 98.2 F (36.8 C)   SpO2: 96%    Wt Readings from Last 3 Encounters:  03/03/18 93.1 kg  02/28/18 90 kg  02/24/18 89.4 kg    General: Pleasant, looks well.  Comfortable.  Alert. Head: No facial asymmetry or swelling.  No signs of head trauma. Eyes: No scleral icterus.  No conjunctival pallor.  EOMI. Ears: Not hard of hearing. Nose: No congestion or discharge. Mouth: Moist, clear, pink oral mucosa.  Tongue midline.  Good dentition. Neck: No JVD, no masses, no thyromegaly. Lungs: Clear bilaterally without labored breathing.  No cough. Heart: RRR.  No MRG.  S1, S2 present. Abdomen: Soft.  Protuberant.  Tender in the epigastrium.  No guarding or rebound.  Bowel sounds slightly hypoactive but normal quality.  No HSM, bruits, hernias..   Rectal: Deferred. Musc/Skeltl: Amputation of the left forefoot.  Right foot bandaged. Extremities: No  CCE. Neurologic: Alert.  Oriented x3.  No limb weakness or tremor.  No gross neurologic deficits. Skin: Rashes, sores, suspicious lesions, significant purpura or bruising. Nodes: No cervical adenopathy. Psych: Calm, pleasant, cooperative.   LAB RESULTS: Recent Labs    03/02/18 0926 03/03/18 0753  WBC 30.4* 29.9*  HGB 10.6* 9.6*  HCT 33.2* 29.9*  PLT 308 274   BMET Lab Results  Component Value Date   NA 137 03/03/2018   NA 138 03/02/2018   NA 141 02/21/2018   K 3.7 03/03/2018   K 4.5 03/02/2018   K 4.0 02/21/2018   CL 100 03/03/2018   CL 102 03/02/2018   CL 107 02/21/2018   CO2 28 03/03/2018   CO2 25 03/02/2018   CO2 26 02/21/2018   GLUCOSE 223 (H) 03/03/2018   GLUCOSE 329 (H) 03/02/2018   GLUCOSE 124 (H) 02/21/2018   BUN 14 03/03/2018   BUN 22 (H) 03/02/2018   BUN 20 02/21/2018   CREATININE 1.04 03/03/2018   CREATININE 1.37 (H) 03/02/2018   CREATININE 1.54 (H) 02/21/2018   CALCIUM 8.4 (L) 03/03/2018   CALCIUM 8.6 (L) 03/02/2018   CALCIUM 8.4 (L) 02/21/2018   LFT Recent Labs    03/02/18 0926 03/03/18 0753  PROT 6.6 6.6  ALBUMIN 2.8* 2.6*  AST 40 19  ALT 70* 44  ALKPHOS 61 55  BILITOT 0.5 0.4   Lipase     Component Value Date/Time   LIPASE 20 03/03/2018 0753     RADIOLOGY STUDIES: images viewed by me and w/ patient Gatha Mayer, MD, [facility]  Dg Chest 2 View  Result Date: 03/02/2018 CLINICAL DATA:  Weakness, sepsis EXAM: CHEST - 2 VIEW COMPARISON:  02/08/2018, 01/24/2018, CT 01/24/2018 FINDINGS: Patchy left lower lobe opacity. No pleural effusion. Stable cardiomediastinal silhouette. No pneumothorax. IMPRESSION: Low lung volumes. Patchy airspace disease in the left lower lobe may reflect atelectasis or mild pneumonia. Electronically Signed   By: Donavan Foil M.D.   On: 03/02/2018 19:39   Ct Abdomen Pelvis W Contrast  Result Date: 03/02/2018 CLINICAL DATA:  Abdominal pain. EXAM: CT ABDOMEN AND PELVIS WITH CONTRAST TECHNIQUE: Multidetector CT  imaging of the abdomen and pelvis was performed using the standard protocol following bolus administration of intravenous contrast. CONTRAST:  198m OMNIPAQUE IOHEXOL 300 MG/ML  SOLN COMPARISON:  CT chest 01/24/2018. FINDINGS: Lower chest: Small left pleural effusion. Subsegmental atelectasis noted in  the left lower lobe. Hepatobiliary: No focal liver abnormality is seen. No gallstones, gallbladder wall thickening, or biliary dilatation. Pancreas: Unremarkable. No pancreatic ductal dilatation or surrounding inflammatory changes. Spleen: Normal in size without focal abnormality. Adrenals/Urinary Tract: Normal adrenal glands. Bilateral perinephric fat stranding identified. No mass or hydronephrosis identified. Urinary bladder appears normal. Stomach/Bowel: The stomach is diffusely abnormal in appearance with marked diffuse gastric wall edema and Peri cast strict fat stranding. Areas of intramural ulceration containing gas is identified along the lesser curvature of the gastric fundus and gastric cardia, image 58/6 and image 72/6. No evidence for gastric perforation identified. No significant wall thickening involving the duodenum. No abnormal small bowel wall thickening or inflammation. No pathologic dilatation of the colon. Vascular/Lymphatic: Aortic atherosclerosis. No aneurysm. No upper abdominal adenopathy identified. No pelvic or inguinal adenopathy. Reproductive: Prostate gland and seminal vesicles are unremarkable. Other: No significant free fluid or fluid collections. Musculoskeletal: No acute or significant osseous findings. IMPRESSION: 1. Marked diffuse gastric wall edema is identified with areas of intramural ulceration in the fundus and gastric cardia. Findings compatible with gastritis. No evidence for perforation. No pneumoperitoneum identified. 2. Mild bilateral perinephric fat stranding identified without hydronephrosis, nonspecific but may be related to prior insult. 3.  Aortic Atherosclerosis  (ICD10-I70.0). 4. Small left pleural effusion and left lower lobe subsegmental atelectasis. Electronically Signed   By: Kerby Moors M.D.   On: 03/02/2018 13:24

## 2018-03-03 NOTE — Progress Notes (Signed)
[facility]  Patient Status: Active (receiving services up to time of hospitalization)  [facility] is providing the following services: RN and PT  If patient discharges after hours, please call [phone].   Shane [name] 03/03/2018, 9:39 AM

## 2018-03-03 NOTE — Progress Notes (Signed)
@IPLOG @        PROGRESS NOTE                                                                                                                                                                                                             Patient Demographics:    Shane [name], is a 47 y.o. male, DOB - 03/24/1971, JSH:[id number]  Admit date - 03/02/2018   Admitting Physician Karmen Bongo, MD  Outpatient Primary MD for the patient is Charlott Rakes, MD  LOS - 1  Chief Complaint  Patient presents with  . Abdominal Pain       Brief Narrative  Shane [name] is a 47 y.o. male with medical history significant of DM; CVA; Jehovah's witness; chronic diabetic foot ulcer s/p I&D on 8/7, recently completed 1 month of doxycycline, with subsequent R foot cellulitis requiring hospitalization through 9/8 and first ray amputation 9/13; lower GI bleed; HTN; HLD; bipolar; and chronic diastolic CHF presenting with abdominal pain found to have severe gastritis on CT scan.   Subjective:    Shane [name] today has, No headache, No chest pain, still having epigastric abdominal pain - No Nausea, No new weakness tingling or numbness, No Cough - SOB.     Assessment  & Plan :     1.  Severe gastritis.  GI on board, bowel rest, currently n.p.o. except medications, gentle IV fluids, IV PPI with oral Karafate, now empirically on Rocephin and Flagyl combination, GI contemplating to do EGD.  Defer management of this problem to GI.  2.  Recent right diabetic foot gangrene requiring surgery by Dr. Sharol Given last week.  Continue local wound care, due to follow with Dr. Sharol Given outpatient this Friday.  If stays in the hospital will consult orthopedic service.  3.  DM type II.  Currently on Lantus and sliding scale will monitor.  Will reduce Lantus dose as oral intake is less than usual.  Lab Results  Component Value Date   HGBA1C 7.3 (H) 02/20/2018   CBG (last 3)  Recent Labs    03/02/18 2213 03/03/18 0818 03/03/18 1202   GLUCAP 234* 203* 138*    4.  Dyslipidemia.  On statin continue.  5.  Essential hypertension.  Currently on combination of Norvasc, Coreg, have added oral scheduled hydralazine for better control along with PRN hydralazine.  Monitor and adjust.  6.  BPH.  On Flomax continue.  7.  Chronic diastolic CHF.  EF 60% on echocardiogram done in February 2018.  Currently compensated.  Family Communication  :  None  Code Status :  Full  Disposition Plan  :  Tele  Consults  :  GI  Procedures  :   CT - 1. Marked diffuse gastric wall edema is identified with areas of intramural ulceration in the fundus and gastric cardia. Findings compatible with gastritis. No evidence for perforation. No pneumoperitoneum identified. 2. Mild bilateral perinephric fat stranding identified without hydronephrosis, nonspecific but may be related to prior insult. 3.  Aortic Atherosclerosis (ICD10-I70.0). 4. Small left pleural effusion and left lower lobe subsegmental atelectasis  DVT Prophylaxis  :  Lovenox   Lab Results  Component Value Date   PLT 274 03/03/2018    Diet :  Diet Order            Diet NPO time specified Except for: Ice Chips, Sips with Meds  Diet effective now               Inpatient Medications Scheduled Meds: . amLODipine  5 mg Oral Daily  . atorvastatin  80 mg Oral q morning - 10a  . carbamazepine  200 mg Oral QHS  . carvedilol  25 mg Oral BID WC  . docusate sodium  100 mg Oral BID  . enoxaparin (LOVENOX) injection  40 mg Subcutaneous Q24H  . FLUoxetine  40 mg Oral Daily  . gabapentin  300 mg Oral QHS  . gi cocktail  30 mL Oral TID  . insulin aspart  0-15 Units Subcutaneous TID WC  . insulin aspart  0-5 Units Subcutaneous QHS  . insulin glargine  46 Units Subcutaneous QHS  . pantoprazole (PROTONIX) IV  40 mg Intravenous Q12H  . sodium chloride flush  3 mL Intravenous Q12H  . sucralfate  1 g Oral TID WC & HS  . tamsulosin  0.4 mg Oral Daily  . traZODone  100 mg Oral QHS    Continuous Infusions: . cefTRIAXone (ROCEPHIN)  IV    . dextrose 5 % and 0.9 % NaCl with KCl 20 mEq/L 125 mL/hr at 03/03/18 1215  . metronidazole 500 mg (03/03/18 1226)   PRN Meds:.HYDROmorphone (DILAUDID) injection, [DISCONTINUED] ondansetron **OR** ondansetron (ZOFRAN) IV  Antibiotics  :   Anti-infectives (From admission, onward)   Start     Dose/Rate Route Frequency Ordered Stop   03/03/18 1400  cefTRIAXone (ROCEPHIN) 2 g in sodium chloride 0.9 % 100 mL IVPB     2 g 200 mL/hr over 30 Minutes Intravenous Every 24 hours 03/02/18 1601     03/02/18 2000  metroNIDAZOLE (FLAGYL) IVPB 500 mg     500 mg 100 mL/hr over 60 Minutes Intravenous Every 8 hours 03/02/18 1601     03/02/18 1330  cefTRIAXone (ROCEPHIN) 2 g in sodium chloride 0.9 % 100 mL IVPB     2 g 200 mL/hr over 30 Minutes Intravenous  Once 03/02/18 1326 03/02/18 1413   03/02/18 1330  metroNIDAZOLE (FLAGYL) IVPB 500 mg     500 mg 100 mL/hr over 60 Minutes Intravenous  Once 03/02/18 1326 03/02/18 1605          Objective:   Vitals:   03/03/18 0449 03/03/18 0450 03/03/18 0559 03/03/18 0933  BP:  (!) 187/103  (!) 182/99  Pulse:  92    Resp:  20    Temp:  98.2 F (36.8 C)    TempSrc:  Oral    SpO2:  96%    Weight: 92.7 kg  93.1 kg   Height:   5\' 7"  (1.702  m)     Wt Readings from Last 3 Encounters:  03/03/18 93.1 kg  02/28/18 90 kg  02/24/18 89.4 kg     Intake/Output Summary (Last 24 hours) at 03/03/2018 1232 Last data filed at 03/03/2018 0600 Gross per 24 hour  Intake 3119.9 ml  Output 1350 ml  Net 1769.9 ml     Physical Exam  Awake Alert, Oriented X 3, No new F.N deficits, Normal affect [place].AT,PERRAL Supple Neck,No JVD, No cervical lymphadenopathy appriciated.  Symmetrical Chest wall movement, Good air movement bilaterally, CTAB RRR,No Gallops,Rubs or new Murmurs, No Parasternal Heave +ve B.Sounds, Abd Soft, +ve epigastric tenderness, No organomegaly appriciated, No rebound - guarding or  rigidity. No Cyanosis, Clubbing or edema, No new Rash or bruise       Data Review:    CBC Recent Labs  Lab 03/02/18 0926 03/03/18 0753  WBC 30.4* 29.9*  HGB 10.6* 9.6*  HCT 33.2* 29.9*  PLT 308 274  MCV 93.0 92.6  MCH 29.7 29.7  MCHC 31.9 32.1  RDW 12.7 13.0    Chemistries  Recent Labs  Lab 03/02/18 0926 03/03/18 0753  NA 138 137  K 4.5 3.7  CL 102 100  CO2 25 28  GLUCOSE 329* 223*  BUN 22* 14  CREATININE 1.37* 1.04  CALCIUM 8.6* 8.4*  MG  --  1.6*  AST 40 19  ALT 70* 44  ALKPHOS 61 55  BILITOT 0.5 0.4   ------------------------------------------------------------------------------------------------------------------ No results for input(s): CHOL, HDL, LDLCALC, TRIG, CHOLHDL, LDLDIRECT in the last 72 hours.  Lab Results  Component Value Date   HGBA1C 7.3 (H) 02/20/2018   ------------------------------------------------------------------------------------------------------------------ No results for input(s): TSH, T4TOTAL, T3FREE, THYROIDAB in the last 72 hours.  Invalid input(s): FREET3 ------------------------------------------------------------------------------------------------------------------ No results for input(s): VITAMINB12, FOLATE, FERRITIN, TIBC, IRON, RETICCTPCT in the last 72 hours.  Coagulation profile No results for input(s): INR, PROTIME in the last 168 hours.  No results for input(s): DDIMER in the last 72 hours.  Cardiac Enzymes No results for input(s): CKMB, TROPONINI, MYOGLOBIN in the last 168 hours.  Invalid input(s): CK ------------------------------------------------------------------------------------------------------------------    Component Value Date/Time   BNP 22.4 02/26/2016 1650    Micro Results Recent Results (from the past 240 hour(s))  Culture, blood (routine x 2)     Status: None (Preliminary result)   Collection Time: 03/02/18  5:22 PM  Result Value Ref Range Status   Specimen Description BLOOD LEFT  ANTECUBITAL  Final   Special Requests   Final    BOTTLES DRAWN AEROBIC ONLY Blood Culture adequate volume   Culture   Final    NO GROWTH < 24 HOURS Performed at [facility], [address] [place], [place] [address]    Report Status PENDING  Incomplete  Culture, blood (routine x 2)     Status: None (Preliminary result)   Collection Time: 03/02/18  5:30 PM  Result Value Ref Range Status   Specimen Description BLOOD LEFT HAND  Final   Special Requests   Final    BOTTLES DRAWN AEROBIC ONLY Blood Culture adequate volume   Culture   Final    NO GROWTH < 24 HOURS Performed at [facility], [place] [address], [place], [place] [address]    Report Status PENDING  Incomplete    Radiology Reports  Dg Chest 2 View  Result Date: 03/02/2018 CLINICAL DATA:  Weakness, sepsis EXAM: CHEST - 2 VIEW COMPARISON:  02/08/2018, 01/24/2018, CT 01/24/2018 FINDINGS: Patchy left lower lobe opacity.  No pleural effusion. Stable cardiomediastinal silhouette. No pneumothorax. IMPRESSION: Low lung volumes. Patchy airspace disease in the left lower lobe may reflect atelectasis or mild pneumonia. Electronically Signed   By: Donavan Foil M.D.   On: 03/02/2018 19:39   Dg Chest 2 View  Result Date: 02/08/2018 CLINICAL DATA:  Chest pain EXAM: CHEST - 2 VIEW COMPARISON:  01/24/2018 chest radiograph. FINDINGS: Stable cardiomediastinal silhouette with normal heart size. No pneumothorax. No pleural effusion. Lungs appear clear, with no acute consolidative airspace disease and no pulmonary edema. IMPRESSION: No active cardiopulmonary disease. Electronically Signed   By: Ilona Sorrel M.D.   On: 02/08/2018 14:00   Ct Abdomen Pelvis W Contrast  Result Date: 03/02/2018 CLINICAL DATA:  Abdominal pain. EXAM: CT ABDOMEN AND PELVIS WITH CONTRAST TECHNIQUE: Multidetector CT imaging of the abdomen and pelvis was performed using the standard protocol following bolus administration of intravenous contrast. CONTRAST:  138mL  OMNIPAQUE IOHEXOL 300 MG/ML  SOLN COMPARISON:  CT chest 01/24/2018. FINDINGS: Lower chest: Small left pleural effusion. Subsegmental atelectasis noted in the left lower lobe. Hepatobiliary: No focal liver abnormality is seen. No gallstones, gallbladder wall thickening, or biliary dilatation. Pancreas: Unremarkable. No pancreatic ductal dilatation or surrounding inflammatory changes. Spleen: Normal in size without focal abnormality. Adrenals/Urinary Tract: Normal adrenal glands. Bilateral perinephric fat stranding identified. No mass or hydronephrosis identified. Urinary bladder appears normal. Stomach/Bowel: The stomach is diffusely abnormal in appearance with marked diffuse gastric wall edema and Peri cast strict fat stranding. Areas of intramural ulceration containing gas is identified along the lesser curvature of the gastric fundus and gastric cardia, image 58/6 and image 72/6. No evidence for gastric perforation identified. No significant wall thickening involving the duodenum. No abnormal small bowel wall thickening or inflammation. No pathologic dilatation of the colon. Vascular/Lymphatic: Aortic atherosclerosis. No aneurysm. No upper abdominal adenopathy identified. No pelvic or inguinal adenopathy. Reproductive: Prostate gland and seminal vesicles are unremarkable. Other: No significant free fluid or fluid collections. Musculoskeletal: No acute or significant osseous findings. IMPRESSION: 1. Marked diffuse gastric wall edema is identified with areas of intramural ulceration in the fundus and gastric cardia. Findings compatible with gastritis. No evidence for perforation. No pneumoperitoneum identified. 2. Mild bilateral perinephric fat stranding identified without hydronephrosis, nonspecific but may be related to prior insult. 3.  Aortic Atherosclerosis (ICD10-I70.0). 4. Small left pleural effusion and left lower lobe subsegmental atelectasis. Electronically Signed   By: Kerby Moors M.D.   On: 03/02/2018  13:24   Mr Foot Right W Wo Contrast  Result Date: 02/21/2018 CLINICAL DATA:  Open wound of the right foot on the first digit. Prior surgeries. Diabetes. EXAM: MRI OF THE RIGHT FOREFOOT WITHOUT AND WITH CONTRAST TECHNIQUE: Multiplanar, multisequence MR imaging of the right forefoot was performed before and after the administration of intravenous contrast. CONTRAST:  8 cc Gadavist COMPARISON:  Multiple exams, including 01/12/2018 and radiographs of 02/18/2018 FINDINGS: Bones/Joint/Cartilage Low-grade but abnormal edema and enhancement in the phalanges of the great toe; head and shaft of the first metatarsal; and in the head and distal shaft of the second metatarsal suspicious for early osteomyelitis. The first digit sesamoids do not appear to abnormally enhance. Trace effusion of the first MTP joint dorsally. Ligaments Today's exam focused on the distal forefoot, and the level of the Lisfranc ligament was omitted. Muscles and Tendons Abnormal edema signal and enhancement especially in the abductor hallucis muscle compatible with myositis. There is a lesser degree of abnormal muscular edema and enhancement in the interosseous muscles and  remaining plantar musculature of the foot, some of which may be neurogenic. Soft tissues Deep ulceration of the medial aspect of the great toe extending to the periosteal margin of the base of the distal phalanx as shown on image 26/11. Additional ulceration medial to the first MTP joint with surrounding subcutaneous edema and inflammatory stranding compatible with cellulitis which extends into the great toe. Questionable foreign body along the dorsum of the foot on image 6/11, dorsal to the second metatarsal shaft, with some surrounding edema and enhancement. This could also represent a small locule of gas. IMPRESSION: 1. Deep ulceration of the medial great toe extending to the base of the distal phalanx periosteal margin. There is abnormal edema and enhancement in the phalanges of  the great toe; in the head and shaft of the first metatarsal; and in the head and distal shaft of the second metatarsal suspicious for early osteomyelitis. 2. 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 3. Cellulitis of the great toe and medial distal foot. 4. Shallow ulceration medial to the first MTP joint. 5. Low signal intensity structure potentially a small foreign body or locule of gas in the dorsal subcutaneous tissues of the foot on image 6/6, dorsal to the level of the second metatarsal, not readily visible on conventional radiography. Electronically Signed   By: Van Clines M.D.   On: 02/21/2018 13:54   Dg Foot Complete Right  Result Date: 02/18/2018 CLINICAL DATA:  Diabetic patient with a wound on the right great toe. EXAM: RIGHT FOOT COMPLETE - 3+ VIEW COMPARISON:  Plain films right foot 02/08/2018. MRI right foot 01/12/2018. FINDINGS: Skin wound is seen along the medial aspect of the first MTP joint. Soft tissues of the foot are swollen. No soft tissue gas or radiopaque foreign body. No bony destructive change or periosteal reaction. IMPRESSION: Skin ulceration soft tissue swelling without evidence of osteomyelitis. Electronically Signed   By: Inge Rise M.D.   On: 02/18/2018 11:14   Dg Foot Complete Right  Result Date: 02/08/2018 CLINICAL DATA:  Poor healing surgical wounds to right foot. History of diabetes. EXAM: RIGHT FOOT COMPLETE - 3+ VIEW COMPARISON:  None. FINDINGS: No fractures. No evidence of osteomyelitis. Vascular calcifications are noted. IMPRESSION: No evidence of osteomyelitis. Electronically Signed   By: Dorise Bullion III M.D   On: 02/08/2018 15:14    Time Spent in minutes  30   Lala Lund M.D on 03/03/2018 at 12:32 PM  To page go to [email or web address] - password [email or web address]

## 2018-03-03 NOTE — Progress Notes (Signed)
MD paged regarding patient's dressing on right foot. Stated he would have Dr. Sharol Given come assess it. Currently wrapped in gauze and kerlix.

## 2018-03-04 ENCOUNTER — Inpatient Hospital Stay (HOSPITAL_COMMUNITY): Payer: Medicare HMO

## 2018-03-04 LAB — GLUCOSE, CAPILLARY
GLUCOSE-CAPILLARY: 71 mg/dL (ref 70–99)
Glucose-Capillary: 146 mg/dL — ABNORMAL HIGH (ref 70–99)
Glucose-Capillary: 151 mg/dL — ABNORMAL HIGH (ref 70–99)
Glucose-Capillary: 146 mg/dL — ABNORMAL HIGH (ref 70–99)
Glucose-Capillary: 132 mg/dL — ABNORMAL HIGH (ref 70–99)
Glucose-Capillary: 75 mg/dL (ref 70–99)

## 2018-03-04 LAB — CBC
HEMATOCRIT: 27.8 % — AB (ref 39.0–52.0)
HEMOGLOBIN: 8.9 g/dL — AB (ref 13.0–17.0)
MCH: 29.8 pg (ref 26.0–34.0)
MCHC: 32 g/dL (ref 30.0–36.0)
MCV: 93 fL (ref 78.0–100.0)
Platelets: 266 10*3/uL (ref 150–400)
RBC: 2.99 MIL/uL — AB (ref 4.22–5.81)
RDW: 13 % (ref 11.5–15.5)
WBC: 22.5 10*3/uL — AB (ref 4.0–10.5)

## 2018-03-04 LAB — URINE CULTURE: Culture: NO GROWTH

## 2018-03-04 LAB — COMPREHENSIVE METABOLIC PANEL
ALT: 29 U/L (ref 0–44)
ANION GAP: 8 (ref 5–15)
AST: 15 U/L (ref 15–41)
Albumin: 2.3 g/dL — ABNORMAL LOW (ref 3.5–5.0)
Alkaline Phosphatase: 51 U/L (ref 38–126)
BUN: 13 mg/dL (ref 6–20)
CHLORIDE: 102 mmol/L (ref 98–111)
CO2: 27 mmol/L (ref 22–32)
Calcium: 8.1 mg/dL — ABNORMAL LOW (ref 8.9–10.3)
Creatinine, Ser: 1.13 mg/dL (ref 0.61–1.24)
GFR calc Af Amer: >60 mL/min (ref >60)
GFR calc non Af Amer: >60 mL/min (ref >60)
Glucose, Bld: 150 mg/dL — ABNORMAL HIGH (ref 70–99)
POTASSIUM: 3.8 mmol/L (ref 3.5–5.1)
Sodium: 137 mmol/L (ref 135–145)
Total Bilirubin: 0.5 mg/dL (ref 0.3–1.2)
Total Protein: 5.5 g/dL — ABNORMAL LOW (ref 6.5–8.1)

## 2018-03-04 LAB — MAGNESIUM: Magnesium: 2.2 mg/dL (ref 1.7–2.4)

## 2018-03-04 MED ORDER — HYDROMORPHONE HCL 1 MG/ML IJ SOLN
1.0000 mg | Freq: Once | INTRAMUSCULAR | Status: AC
  Administered 2018-03-04: 1 mg via INTRAVENOUS
  Filled 2018-03-04: qty 1

## 2018-03-04 MED ORDER — KCL IN DEXTROSE-NACL 20-5-0.9 MEQ/L-%-% IV SOLN
INTRAVENOUS | Status: DC
  Administered 2018-03-04: 22:00:00 via INTRAVENOUS
  Filled 2018-03-04 (×2): qty 1000

## 2018-03-04 NOTE — Consult Note (Addendum)
Reason for Consult: Gastric inflammation and, edema, ulceration questionable embolic Referring Physician: [name] [facility]  Shane [name] is an 47 y.o. male.    HPI: History medical patient who was admitted on 02/18/2018 with cellulitis of the right foot.  On 02/27/2018 he was taken the operating room by Dr. Meridee Score  to and underwent right foot first ray amputation.  Patient was discharged home on 03/01/2018.  Return to the ED on 03/02/2018.  He complained of abdominal pain with nausea and vomiting and was actively vomiting in triage on admission.  He reports he did have a ongoing pain from his amputation site was not able to tolerate anything orally.  With the nausea and vomiting he developed severe chest pain.  He is denies any NSAID use.  He has a history of gastroparesis with an EGD, 01/31/2018.  He was admitted with sepsis, gastritis, diabetes, hypertension, stage III kidney disease, status post right first ray amputation.  He was seen in consultation by Dr. Silvano Rusk he notes a CT revealed gastritis and gastric ulceration it was his opinion the patient may have had an acute ischemic/embolic event after his amputation.  He did not think an EGD would be useful.  We are asked to see the patient in consultation.  Review of the chart today shows he is afebrile and somewhat hypertensive.  CMP is stable.  WBC has improved 29.9 >> 22.5.  H/H 9.6/29.9 >> 8.9/27.9, Platelets are stable.  CT scan on admission 03/02/2018: Marked diffuse gastric wall edema with areas of intramural ulceration in the fundus of the gastric cardia.  Compatible with gastritis.  No evidence of perforation, no pneumoperitoneum.  Mild bilateral perinephric fat stranding without hydronephrosis.  Repeat portable abdominal film today shows scattered large and small bowel gas noted no obstructive changes no free air. He is still having ongoing pain and no significant improvement.    Past Medical History:  Diagnosis Date  . Anemia   .  Asthma    as a child  . Bipolar disorder (Shane [name])    pt denies this   . Chest pain    a. 2015 Reportedly normal stress test in [place].  Marland Kitchen Chronic diastolic CHF (congestive heart failure) (HCC)    a.03/2015 Echo: EF 55-60%, Gr 1 DD, mild MR, triv PR.  . Depression   . Depression with anxiety   . Dyspnea    with exertion  . GERD (gastroesophageal reflux disease)   . Glaucoma   . Hyperlipidemia   . Hypertension    a. 08/2014 Admitted with hypertensive urgency.  . Insomnia   . Internal carotid artery stenosis   . Lower GI bleed 07/04/2015  . Neuropathy   . Refusal of blood transfusions as patient is Jehovah's Witness   . TIA (transient ischemic attack) 08/2014; 03/2015   a. 08/2014 in setting of hypertensive urgency.  . Type II diabetes mellitus ([place])    started insulin spring 2016, Type II  . Vitamin D deficiency spring 2016    Past Surgical History:  Procedure Laterality Date  . AMPUTATION Left 08/03/2015   Procedure: AMPUTATION LEFT GREAT TOE;  Surgeon: Newt Minion, MD;  Location: [place];  Service: Orthopedics;  Laterality: Left;  . AMPUTATION Left 12/02/2015   Procedure: amputation of left 2nd digit  . AMPUTATION Left 04/10/2016   Procedure: Left 2nd Toe Amputation at MTP Joint;  Surgeon: Newt Minion, MD;  Location: [place];  Service: Orthopedics;  Laterality: Left;  . AMPUTATION Left 06/09/2016  Procedure: AMPUTATION LEFT THIRD TOE;  Surgeon: [name], MD;  Location: [place];  Service: Orthopedics;  Laterality: Left;  . AMPUTATION Left 06/26/2016   Procedure: Left Foot Transmetatarsal Amputation;  Surgeon: Newt Minion, MD;  Location: [place];  Service: Orthopedics;  Laterality: Left;  . AMPUTATION Right 02/27/2018   Procedure: RIGHT FOOT 1ST RAY AMPUTATION;  Surgeon: Newt Minion, MD;  Location: [place];  Service: Orthopedics;  Laterality: Right;  . APPLICATION OF WOUND VAC Left 12/19/2015   Procedure: APPLICATION OF WOUND VAC;  Surgeon: Meredith Pel, MD;  Location: [place];   Service: Orthopedics;  Laterality: Left;  . CIRCUMCISION    . COLONOSCOPY N/A 01/22/2016   Procedure: COLONOSCOPY;  Surgeon: Gatha Mayer, MD;  Location: [place];  Service: Endoscopy;  Laterality: N/A;  . ESOPHAGOGASTRODUODENOSCOPY N/A 01/19/2016   Procedure: ESOPHAGOGASTRODUODENOSCOPY (EGD);  Surgeon: Manus Gunning, MD;  Location: [place];  Service: Gastroenterology;  Laterality: N/A;  . ESOPHAGOGASTRODUODENOSCOPY N/A 01/22/2016   Procedure: ESOPHAGOGASTRODUODENOSCOPY (EGD);  Surgeon: Gatha Mayer, MD;  Location: [facility] ENDOSCOPY;  Service: Endoscopy;  Laterality: N/A;  . I&D EXTREMITY Left 12/02/2015   Procedure: IRRIGATION AND DEBRIDEMENT OF FOOT; LEFT SECOND TOE AMPUTATION;  Surgeon: Meredith Pel, MD;  Location: [place];  Service: Orthopedics;  Laterality: Left;  . I&D EXTREMITY Left 12/19/2015   Procedure: I & D LEFT FOOT WITH BEADS ;  Surgeon: Meredith Pel, MD;  Location: [place];  Service: Orthopedics;  Laterality: Left;  . I&D EXTREMITY Right 01/17/2016   Procedure: IRRIGATION AND DEBRIDEMENT RIGHT FOOT;  Surgeon: Newt Minion, MD;  Location: [place];  Service: Orthopedics;  Laterality: Right;  . I&D EXTREMITY Right 01/21/2018   Procedure: IRRIGATION AND DEBRIDEMENT RIGHT FOOT;  Surgeon: Newt Minion, MD;  Location: [place];  Service: Orthopedics;  Laterality: Right;  . INCISION AND DRAINAGE FOOT Right 01/17/2016  . INGUINAL HERNIA REPAIR Bilateral ~ 1983- ~ 1986  . TONSILLECTOMY  ~ 60    Family History  Problem Relation Age of Onset  . Hypertension Mother   . Diabetes Mother   . Hyperlipidemia Mother   . Heart disease Mother        s/p pacemaker  . Diabetes Father   . Hypertension Father   . Stroke Father   . Heart attack Father        first MI @ 39.  . Depression Father   . Dementia Father   . Stroke Brother   . ADD / ADHD Brother   . Anxiety disorder Brother   . Bipolar disorder Brother   . OCD Brother   . Sexual abuse Brother     Social History:   reports that he has never smoked. He has never used smokeless tobacco. He reports that he does not drink alcohol or use drugs.  Allergies:  Allergies  Allergen Reactions  . Other Diarrhea and Other (See Comments)    Red meat causes stomach pains, bloating and diarrhea  . Lactose Intolerance (Gi) Diarrhea and Other (See Comments)    Bloating     Medications:  Prior to Admission:  Medications Prior to Admission  Medication Sig Dispense Refill Last Dose  . acetaminophen (TYLENOL) 500 MG tablet Take 500 mg by mouth every 6 (six) hours as needed for headache (pain).   03/02/2018 at am  . amLODipine (NORVASC) 5 MG tablet Take 1 tablet (5 mg total) by mouth daily. 30 tablet 3 03/01/2018 at am  .  aspirin EC 81 MG tablet Take 1 tablet (81 mg total) by mouth daily. 30 tablet 11 03/01/2018 at am  . carbamazepine (TEGRETOL) 200 MG tablet TAKE 1 TABLET BY MOUTH AT BEDTIME. (Patient taking differently: Take 200 mg by mouth at bedtime. ) 90 tablet 4 03/01/2018 at pm  . carvedilol (COREG) 25 MG tablet Take 1 tablet (25 mg total) by mouth 2 (two) times daily with a meal. (Patient taking differently: Take 25 mg by mouth daily. ) 60 tablet 6 03/01/2018 at 800  . FLUoxetine (PROZAC) 40 MG capsule Take 1 capsule (40 mg total) by mouth daily. 90 capsule 1 03/01/2018 at am  . gabapentin (NEURONTIN) 300 MG capsule Take 1 capsule (300 mg total) by mouth 2 (two) times daily. (Patient taking differently: Take 300 mg by mouth at bedtime. ) 60 capsule 6 03/01/2018 at pm  . glipiZIDE (GLUCOTROL XL) 10 MG 24 hr tablet Take 1 tablet (10 mg total) by mouth daily with breakfast. 30 tablet 6 03/01/2018 at am  . Insulin Glargine (LANTUS SOLOSTAR) 100 UNIT/ML Solostar Pen Inject 35 Units into the skin 2 (two) times daily. (Patient taking differently: Inject 46 Units into the skin at bedtime. ) 30 mL 6 03/01/2018 at pm  . lisinopril (PRINIVIL,ZESTRIL) 10 MG tablet Take 1 tablet (10 mg total) by mouth daily. 30 tablet 0 03/01/2018 at am  .  methocarbamol (ROBAXIN) 500 MG tablet Take 1 tablet (500 mg total) by mouth every 6 (six) hours as needed for muscle spasms. 30 tablet 0 week ago  . pantoprazole (PROTONIX) 40 MG tablet Take 1 tablet (40 mg total) by mouth daily. 30 tablet 3 03/01/2018 at am  . polyethylene glycol (MIRALAX / GLYCOLAX) packet Take 17 g by mouth daily as needed for moderate constipation. 14 each 0 few months ago  . senna-docusate (SENOKOT-S) 8.6-50 MG tablet Take 1 tablet by mouth at bedtime. (Patient taking differently: Take 1 tablet by mouth at bedtime as needed for mild constipation. ) 60 tablet 2 few months ago  . sucralfate (CARAFATE) 1 g tablet TAKE 1 TABLET (1 G TOTAL) BY MOUTH 4 (FOUR) TIMES DAILY - WITH MEALS AND AT BEDTIME. (Patient taking differently: Take 1 g by mouth 2 (two) times daily with a meal. ) 120 tablet 0 03/01/2018 at pm  . tamsulosin (FLOMAX) 0.4 MG CAPS capsule Take 1 capsule (0.4 mg total) by mouth daily. 30 capsule 0 03/01/2018 at pm  . traZODone (DESYREL) 100 MG tablet Take 1 tablet (100 mg total) by mouth at bedtime.   03/01/2018 at pm  . ACCU-CHEK FASTCLIX LANCETS MISC Use as directed three times daily 100 each 12   . atorvastatin (LIPITOR) 80 MG tablet Take 1 tablet (80 mg total) by mouth every morning. (Patient not taking: Reported on 03/02/2018) 30 tablet 5 Not Taking at Unknown time  . Blood Glucose Monitoring Suppl (ACCU-CHEK AVIVA PLUS) w/Device KIT Use as directed three times daily 1 kit 0   . glucose blood (ACCU-CHEK AVIVA PLUS) test strip Use as instructed three times daily 100 each 12   . HYDROcodone-acetaminophen (NORCO/VICODIN) 5-325 MG tablet Take 1 tablet by mouth every 4 (four) hours as needed for moderate pain. 30 tablet 0 not yet taken   Scheduled: . amLODipine  5 mg Oral Daily  . atorvastatin  80 mg Oral q morning - 10a  . carbamazepine  200 mg Oral QHS  . carvedilol  25 mg Oral BID WC  . docusate sodium  100 mg Oral BID  .  enoxaparin (LOVENOX) injection  40 mg Subcutaneous  Q24H  . FLUoxetine  40 mg Oral Daily  . gabapentin  300 mg Oral QHS  . gi cocktail  30 mL Oral TID  . hydrALAZINE  50 mg Oral Q8H  . insulin aspart  0-15 Units Subcutaneous TID WC  . insulin aspart  0-5 Units Subcutaneous QHS  . insulin glargine  20 Units Subcutaneous BID  . pantoprazole (PROTONIX) IV  40 mg Intravenous Q12H  . sodium chloride flush  3 mL Intravenous Q12H  . sucralfate  1 g Oral TID WC & HS  . tamsulosin  0.4 mg Oral Daily  . traZODone  100 mg Oral QHS   Continuous: . cefTRIAXone (ROCEPHIN)  IV 2 g (03/03/18 1348)  . dextrose 5 % and 0.9 % NaCl with KCl 20 mEq/L    . metronidazole 500 mg (03/04/18 1118)   Anti-infectives (From admission, onward)   Start     Dose/Rate Route Frequency Ordered Stop   03/03/18 1400  cefTRIAXone (ROCEPHIN) 2 g in sodium chloride 0.9 % 100 mL IVPB     2 g 200 mL/hr over 30 Minutes Intravenous Every 24 hours 03/02/18 1601     03/02/18 2000  metroNIDAZOLE (FLAGYL) IVPB 500 mg     500 mg 100 mL/hr over 60 Minutes Intravenous Every 8 hours 03/02/18 1601     03/02/18 1330  cefTRIAXone (ROCEPHIN) 2 g in sodium chloride 0.9 % 100 mL IVPB     2 g 200 mL/hr over 30 Minutes Intravenous  Once 03/02/18 1326 03/02/18 1413   03/02/18 1330  metroNIDAZOLE (FLAGYL) IVPB 500 mg     500 mg 100 mL/hr over 60 Minutes Intravenous  Once 03/02/18 1326 03/02/18 1605      Results for orders placed or performed during the hospital encounter of 03/02/18 (from the past 48 hour(s))  I-Stat CG4 Lactic Acid, ED     Status: Abnormal   Collection Time: 03/02/18 12:21 PM  Result Value Ref Range   Lactic Acid, Venous 3.26 (HH) 0.5 - 1.9 mmol/L   Comment NOTIFIED PHYSICIAN   I-stat troponin, ED     Status: None   Collection Time: 03/02/18 12:24 PM  Result Value Ref Range   Troponin i, poc 0.00 0.00 - 0.08 ng/mL   Comment 3            Comment: Due to the release kinetics of cTnI, a negative result within the first hours of the onset of symptoms does not rule  out myocardial infarction with certainty. If myocardial infarction is still suspected, repeat the test at appropriate intervals.   Glucose, capillary     Status: Abnormal   Collection Time: 03/02/18  5:19 PM  Result Value Ref Range   Glucose-Capillary 256 (H) 70 - 99 mg/dL  Procalcitonin     Status: None   Collection Time: 03/02/18  5:22 PM  Result Value Ref Range   Procalcitonin 0.32 ng/mL    Comment:        Interpretation: PCT (Procalcitonin) <= 0.5 ng/mL: Systemic infection (sepsis) is not likely. Local bacterial infection is possible. (NOTE)       Sepsis PCT Algorithm           Lower Respiratory Tract                                      Infection PCT Algorithm    ----------------------------     ----------------------------  PCT < 0.25 ng/mL                PCT < 0.10 ng/mL         Strongly encourage             Strongly discourage   discontinuation of antibiotics    initiation of antibiotics    ----------------------------     -----------------------------       PCT 0.25 - 0.50 ng/mL            PCT 0.10 - 0.25 ng/mL               OR       >80% decrease in PCT            Discourage initiation of                                            antibiotics      Encourage discontinuation           of antibiotics    ----------------------------     -----------------------------         PCT >= 0.50 ng/mL              PCT 0.26 - 0.50 ng/mL               AND        <80% decrease in PCT             Encourage initiation of                                             antibiotics       Encourage continuation           of antibiotics    ----------------------------     -----------------------------        PCT >= 0.50 ng/mL                  PCT > 0.50 ng/mL               AND         increase in PCT                  Strongly encourage                                      initiation of antibiotics    Strongly encourage escalation           of antibiotics                                      -----------------------------                                           PCT <= 0.25 ng/mL  OR                                        > 80% decrease in PCT                                     Discontinue / Do not initiate                                             antibiotics Performed at [facility], [place] [address] [place], [place] [address]   Lactic acid, plasma     Status: None   Collection Time: 03/02/18  5:22 PM  Result Value Ref Range   Lactic Acid, Venous 1.7 0.5 - 1.9 mmol/L    Comment: Performed at [facility] [address], [place], [place] [address]  Culture, blood (routine x 2)     Status: None (Preliminary result)   Collection Time: 03/02/18  5:22 PM  Result Value Ref Range   Specimen Description BLOOD LEFT ANTECUBITAL    Special Requests      BOTTLES DRAWN AEROBIC ONLY Blood Culture adequate volume   Culture      NO GROWTH 2 DAYS Performed at [facility], [place] [address] [place], [place] [address]    Report Status PENDING   Culture, blood (routine x 2)     Status: None (Preliminary result)   Collection Time: 03/02/18  5:30 PM  Result Value Ref Range   Specimen Description BLOOD LEFT HAND    Special Requests      BOTTLES DRAWN AEROBIC ONLY Blood Culture adequate volume   Culture      NO GROWTH 2 DAYS Performed at [facility], [place] [address], [place], [place] [address]    Report Status PENDING   Lactic acid, plasma     Status: None   Collection Time: 03/02/18  8:27 PM  Result Value Ref Range   Lactic Acid, Venous 1.4 0.5 - 1.9 mmol/L    Comment: Performed at [facility] [address] [place], [place] [address]  Glucose, capillary     Status: Abnormal   Collection Time: 03/02/18 10:13 PM  Result Value Ref Range   Glucose-Capillary 234 (H) 70 - 99 mg/dL  CBC     Status: Abnormal   Collection Time: 03/03/18  7:53 AM  Result Value Ref Range   WBC 29.9 (H) 4.0 -  10.5 K/uL    Comment: REPEATED TO VERIFY   RBC 3.23 (L) 4.22 - 5.81 MIL/uL   Hemoglobin 9.6 (L) 13.0 - 17.0 g/dL   HCT 29.9 (L) 39.0 - 52.0 %   MCV 92.6 78.0 - 100.0 fL   MCH 29.7 26.0 - 34.0 pg   MCHC 32.1 30.0 - 36.0 g/dL   RDW 13.0 11.5 - 15.5 %   Platelets 274 150 - 400 K/uL    Comment: Performed at [facility], [place] [address], [place], [place] [address]  Comprehensive metabolic panel     Status: Abnormal   Collection Time: 03/03/18  7:53 AM  Result Value Ref Range   Sodium 137 135 - 145 mmol/L   Potassium 3.7  3.5 - 5.1 mmol/L   Chloride 100 98 - 111 mmol/L   CO2 28 22 - 32 mmol/L   Glucose, Bld 223 (H) 70 - 99 mg/dL   BUN 14 6 - 20 mg/dL   Creatinine, Ser 1.04 0.61 - 1.24 mg/dL   Calcium 8.4 (L) 8.9 - 10.3 mg/dL   Total Protein 6.6 6.5 - 8.1 g/dL   Albumin 2.6 (L) 3.5 - 5.0 g/dL   AST 19 15 - 41 U/L   ALT 44 0 - 44 U/L   Alkaline Phosphatase 55 38 - 126 U/L   Total Bilirubin 0.4 0.3 - 1.2 mg/dL   GFR calc non Af Amer >60 >60 mL/min   GFR calc Af Amer >60 >60 mL/min    Comment: (NOTE) The eGFR has been calculated using the CKD EPI equation. This calculation has not been validated in all clinical situations. eGFR's persistently <60 mL/min signify possible Chronic Kidney Disease.    Anion gap 9 5 - 15    Comment: Performed at [facility] [address] [place], [place] [address]  Magnesium     Status: Abnormal   Collection Time: 03/03/18  7:53 AM  Result Value Ref Range   Magnesium 1.6 (L) 1.7 - 2.4 mg/dL    Comment: Performed at [facility] [address] [place], [place] [address]  Lactic acid, plasma     Status: None   Collection Time: 03/03/18  7:53 AM  Result Value Ref Range   Lactic Acid, Venous 1.1 0.5 - 1.9 mmol/L    Comment: Performed at [facility] [address] [place], [place] [address]  Lipase, blood     Status: None   Collection Time: 03/03/18  7:53 AM  Result Value Ref Range   Lipase 20 11 - 51 U/L    Comment:  Performed at [facility] [address], [place], [place] [address]  Glucose, capillary     Status: Abnormal   Collection Time: 03/03/18  8:18 AM  Result Value Ref Range   Glucose-Capillary 203 (H) 70 - 99 mg/dL  Glucose, capillary     Status: Abnormal   Collection Time: 03/03/18 12:02 PM  Result Value Ref Range   Glucose-Capillary 138 (H) 70 - 99 mg/dL  Glucose, capillary     Status: Abnormal   Collection Time: 03/03/18  5:01 PM  Result Value Ref Range   Glucose-Capillary 103 (H) 70 - 99 mg/dL  Glucose, capillary     Status: Abnormal   Collection Time: 03/03/18 10:06 PM  Result Value Ref Range   Glucose-Capillary 125 (H) 70 - 99 mg/dL  Glucose, capillary     Status: Abnormal   Collection Time: 03/04/18  3:16 AM  Result Value Ref Range   Glucose-Capillary 146 (H) 70 - 99 mg/dL  Comprehensive metabolic panel     Status: Abnormal   Collection Time: 03/04/18  5:16 AM  Result Value Ref Range   Sodium 137 135 - 145 mmol/L   Potassium 3.8 3.5 - 5.1 mmol/L   Chloride 102 98 - 111 mmol/L   CO2 27 22 - 32 mmol/L   Glucose, Bld 150 (H) 70 - 99 mg/dL   BUN 13 6 - 20 mg/dL   Creatinine, Ser 1.13 0.61 - 1.24 mg/dL   Calcium 8.1 (L) 8.9 - 10.3 mg/dL   Total Protein 5.5 (L) 6.5 - 8.1 g/dL   Albumin 2.3 (L) 3.5 - 5.0 g/dL   AST 15 15 -  41 U/L   ALT 29 0 - 44 U/L   Alkaline Phosphatase 51 38 - 126 U/L   Total Bilirubin 0.5 0.3 - 1.2 mg/dL   GFR calc non Af Amer >60 >60 mL/min   GFR calc Af Amer >60 >60 mL/min    Comment: (NOTE) The eGFR has been calculated using the CKD EPI equation. This calculation has not been validated in all clinical situations. eGFR's persistently <60 mL/min signify possible Chronic Kidney Disease.    Anion gap 8 5 - 15    Comment: Performed at [facility] [address], [place], [place] [address]  CBC     Status: Abnormal   Collection Time: 03/04/18  5:16 AM  Result Value Ref Range   WBC 22.5 (H) 4.0 - 10.5 K/uL   RBC 2.99 (L) 4.22 - 5.81  MIL/uL   Hemoglobin 8.9 (L) 13.0 - 17.0 g/dL   HCT 27.8 (L) 39.0 - 52.0 %   MCV 93.0 78.0 - 100.0 fL   MCH 29.8 26.0 - 34.0 pg   MCHC 32.0 30.0 - 36.0 g/dL   RDW 13.0 11.5 - 15.5 %   Platelets 266 150 - 400 K/uL    Comment: Performed at [facility], [place] [address] [place], [place] [address]  Magnesium     Status: None   Collection Time: 03/04/18  5:16 AM  Result Value Ref Range   Magnesium 2.2 1.7 - 2.4 mg/dL    Comment: Performed at [facility] [address], [place], [place] [address]  Glucose, capillary     Status: Abnormal   Collection Time: 03/04/18  6:44 AM  Result Value Ref Range   Glucose-Capillary 151 (H) 70 - 99 mg/dL  Glucose, capillary     Status: Abnormal   Collection Time: 03/04/18  8:24 AM  Result Value Ref Range   Glucose-Capillary 146 (H) 70 - 99 mg/dL    Dg Chest 2 View  Result Date: 03/02/2018 CLINICAL DATA:  Weakness, sepsis EXAM: CHEST - 2 VIEW COMPARISON:  02/08/2018, 01/24/2018, CT 01/24/2018 FINDINGS: Patchy left lower lobe opacity. No pleural effusion. Stable cardiomediastinal silhouette. No pneumothorax. IMPRESSION: Low lung volumes. Patchy airspace disease in the left lower lobe may reflect atelectasis or mild pneumonia. Electronically Signed   By: Donavan Foil M.D.   On: 03/02/2018 19:39   Ct Abdomen Pelvis W Contrast  Result Date: 03/02/2018 CLINICAL DATA:  Abdominal pain. EXAM: CT ABDOMEN AND PELVIS WITH CONTRAST TECHNIQUE: Multidetector CT imaging of the abdomen and pelvis was performed using the standard protocol following bolus administration of intravenous contrast. CONTRAST:  149m OMNIPAQUE IOHEXOL 300 MG/ML  SOLN COMPARISON:  CT chest 01/24/2018. FINDINGS: Lower chest: Small left pleural effusion. Subsegmental atelectasis noted in the left lower lobe. Hepatobiliary: No focal liver abnormality is seen. No gallstones, gallbladder wall thickening, or biliary dilatation. Pancreas: Unremarkable. No pancreatic ductal dilatation or  surrounding inflammatory changes. Spleen: Normal in size without focal abnormality. Adrenals/Urinary Tract: Normal adrenal glands. Bilateral perinephric fat stranding identified. No mass or hydronephrosis identified. Urinary bladder appears normal. Stomach/Bowel: The stomach is diffusely abnormal in appearance with marked diffuse gastric wall edema and Peri cast strict fat stranding. Areas of intramural ulceration containing gas is identified along the lesser curvature of the gastric fundus and gastric cardia, image 58/6 and image 72/6. No evidence for gastric perforation identified. No significant wall thickening involving the duodenum. No abnormal small bowel wall thickening or inflammation. No pathologic dilatation of the colon. Vascular/Lymphatic:  Aortic atherosclerosis. No aneurysm. No upper abdominal adenopathy identified. No pelvic or inguinal adenopathy. Reproductive: Prostate gland and seminal vesicles are unremarkable. Other: No significant free fluid or fluid collections. Musculoskeletal: No acute or significant osseous findings. IMPRESSION: 1. Marked diffuse gastric wall edema is identified with areas of intramural ulceration in the fundus and gastric cardia. Findings compatible with gastritis. No evidence for perforation. No pneumoperitoneum identified. 2. Mild bilateral perinephric fat stranding identified without hydronephrosis, nonspecific but may be related to prior insult. 3.  Aortic Atherosclerosis (ICD10-I70.0). 4. Small left pleural effusion and left lower lobe subsegmental atelectasis. Electronically Signed   By: Kerby Moors M.D.   On: 03/02/2018 13:24   Dg Abd Portable 1v  Result Date: 03/04/2018 CLINICAL DATA:  Central abdominal pain for several days EXAM: PORTABLE ABDOMEN - 1 VIEW COMPARISON:  None. FINDINGS: Scattered large and small bowel gas is noted. No obstructive changes are seen. No free air is noted. Vas deferens calcifications are seen. No acute bony abnormality is noted.  IMPRESSION: No acute abnormality noted. Electronically Signed   By: Inez Catalina M.D.   On: 03/04/2018 08:45    Review of Systems  Constitutional: Positive for chills. Negative for fever and weight loss.  HENT: Negative.   Eyes: Negative.   Respiratory: Positive for shortness of breath (DOE). Negative for hemoptysis, sputum production and wheezing.   Cardiovascular: Positive for chest pain (occasional). Negative for palpitations, orthopnea, claudication, leg swelling and PND.  Gastrointestinal: Positive for abdominal pain (pain remains a persistent problem), heartburn, nausea and vomiting. Negative for blood in stool, constipation, diarrhea and melena.  Genitourinary:       Urine is dark  Musculoskeletal: Negative.   Skin: Negative.   Neurological: Negative.   Endo/Heme/Allergies: Negative.   Psychiatric/Behavioral: Negative.    Blood pressure (!) 168/92, pulse 78, temperature (!) 97.5 F (36.4 C), temperature source Oral, resp. rate 16, height 5' 7"  (1.702 m), weight 93.1 kg, SpO2 96 %. Physical Exam  Constitutional: He is oriented to person, place, and time. He appears well-developed and well-nourished. No distress.  HENT:  Head: Normocephalic and atraumatic.  Mouth/Throat: Oropharynx is clear and moist. No oropharyngeal exudate.  Eyes: Right eye exhibits no discharge. Left eye exhibits no discharge. No scleral icterus.  Pupils are equal  Neck: Normal range of motion. Neck supple. No JVD present. No tracheal deviation present. No thyromegaly present.  Cardiovascular: Normal rate, regular rhythm, normal heart sounds and intact distal pulses.  No murmur heard. Respiratory: Effort normal and breath sounds normal. No respiratory distress. He has no wheezes. He has no rales. He exhibits no tenderness.  GI: He exhibits distension. He exhibits no mass. There is tenderness (he is tender mid epigastric area and Right lower quadrant). There is no rebound and no guarding.  Musculoskeletal: He  exhibits no tenderness.  Left ray amputation now with new right ray amputatin - dressing in place.  Lymphadenopathy:    He has no cervical adenopathy.  Neurological: He is alert and oriented to person, place, and time. No cranial nerve deficit.  Skin: Skin is warm and dry. No rash noted. No erythema. No pallor.  Psychiatric: He has a normal mood and affect. His behavior is normal. Judgment and thought content normal.    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 Brantley Stage.  Dr. Carlean Purl is concerned he has not improved Rocephin/flagyl/Protonix/carafate after 48 hours.  Consider changing antibiotic, check for H pylori.  Will be available as needed.    Maven Varelas 03/04/2018, 11:02 AM

## 2018-03-04 NOTE — Progress Notes (Signed)
Patient c/o increased abdominal pain despite previous pain med dose. Not due for pain med again until 12:00. Notified Dr Candiss Norse via telephone. New order received for additional dose of dilaudid.

## 2018-03-04 NOTE — Progress Notes (Signed)
Patient ID: Shane [name], male   DOB: May 24, 1971, 47 y.o.   MRN: [id number] Patient is status post right foot first ray amputation.  Dressing was taken down the incision is well approximated there is no ischemic changes no cellulitis no odor no drainage no signs of infection there is minimal swelling.  Patient should continue with minimizing weightbearing on the right foot ideally nonweightbearing I will follow-up in the office 1 to 2 weeks to remove the sutures.  Patient may change the dressing as needed.

## 2018-03-04 NOTE — Progress Notes (Signed)
@IPLOG @        PROGRESS NOTE                                                                                                                                                                                                             Patient Demographics:    Shane [name], is a 47 y.o. male, DOB - 02-Jul-1970, ZOX:[id number]  Admit date - 03/02/2018   Admitting Physician Karmen Bongo, MD  Outpatient Primary MD for the patient is Charlott Rakes, MD  LOS - 2  Chief Complaint  Patient presents with  . Abdominal Pain       Brief Narrative  Shane [name] is a 47 y.o. male with medical history significant of DM; CVA; Jehovah's witness; chronic diabetic foot ulcer s/p I&D on 8/7, recently completed 1 month of doxycycline, with subsequent R foot cellulitis requiring hospitalization through 9/8 and first ray amputation 9/13; lower GI bleed; HTN; HLD; bipolar; and chronic diastolic CHF presenting with abdominal pain found to have severe gastritis on CT scan.   Subjective:   Patient in bed, appears comfortable, denies any headache, no fever, no chest pain or pressure, no shortness of breath , says he continues to have epigastric abdominal pain. No focal weakness.   Assessment  & Plan :     1. Severe gastritis.  Continue supportive care with bowel rest, IV PPI, Carafate, discussed the case with GI physician Dr. Michelene Heady, suspicion for possible ischemic insult, continue empiric antibiotics, CT and abdominal x-ray noted, on the recommendation of GI general surgery has been involved as well.  Continue pain control and supportive care.   2.  Recent right diabetic foot gangrene requiring surgery by Dr. Sharol Given last week.  Continue local wound care, requested Dr. Sharol Given to evaluate him in the hospital on 03/04/2018.  3.  DM type II.  Currently on Lantus and sliding scale will monitor.  Will reduce Lantus dose as oral intake is less than usual.  Lab Results  Component Value Date   HGBA1C 7.3 (H)  02/20/2018   CBG (last 3)  Recent Labs    03/04/18 0316 03/04/18 0644 03/04/18 0824  GLUCAP 146* 151* 146*    4.  Dyslipidemia.  On statin continue.  5.  Essential hypertension.  Currently on combination of Norvasc, Coreg, have added oral scheduled hydralazine for better control along with PRN hydralazine.  Monitor and adjust.  6.  BPH.  On Flomax continue.  7.  Chronic diastolic CHF.  EF 60% on echocardiogram done in February 2018.  Currently compensated.    Family Communication  :  None  Code Status :  Full  Disposition Plan  :  Tele  Consults  :  GI, general surgery, orthopedics  Procedures  :   CT - 1. Marked diffuse gastric wall edema is identified with areas of intramural ulceration in the fundus and gastric cardia. Findings compatible with gastritis. No evidence for perforation. No pneumoperitoneum identified. 2. Mild bilateral perinephric fat stranding identified without hydronephrosis, nonspecific but may be related to prior insult. 3.  Aortic Atherosclerosis (ICD10-I70.0). 4. Small left pleural effusion and left lower lobe subsegmental atelectasis  DVT Prophylaxis  :  Lovenox   Lab Results  Component Value Date   PLT 266 03/04/2018    Diet :  Diet Order            Diet NPO time specified Except for: Ice Chips, Sips with Meds  Diet effective now               Inpatient Medications Scheduled Meds: . amLODipine  5 mg Oral Daily  . atorvastatin  80 mg Oral q morning - 10a  . carbamazepine  200 mg Oral QHS  . carvedilol  25 mg Oral BID WC  . docusate sodium  100 mg Oral BID  . enoxaparin (LOVENOX) injection  40 mg Subcutaneous Q24H  . FLUoxetine  40 mg Oral Daily  . gabapentin  300 mg Oral QHS  . gi cocktail  30 mL Oral TID  . hydrALAZINE  50 mg Oral Q8H  . insulin aspart  0-15 Units Subcutaneous TID WC  . insulin aspart  0-5 Units Subcutaneous QHS  . insulin glargine  20 Units Subcutaneous BID  . pantoprazole (PROTONIX) IV  40 mg Intravenous Q12H   . sodium chloride flush  3 mL Intravenous Q12H  . sucralfate  1 g Oral TID WC & HS  . tamsulosin  0.4 mg Oral Daily  . traZODone  100 mg Oral QHS   Continuous Infusions: . cefTRIAXone (ROCEPHIN)  IV 2 g (03/03/18 1348)  . dextrose 5 % and 0.9 % NaCl with KCl 20 mEq/L 125 mL/hr at 03/04/18 0938  . metronidazole 500 mg (03/04/18 1118)   PRN Meds:.hydrALAZINE, HYDROmorphone (DILAUDID) injection, [DISCONTINUED] ondansetron **OR** ondansetron (ZOFRAN) IV  Antibiotics  :   Anti-infectives (From admission, onward)   Start     Dose/Rate Route Frequency Ordered Stop   03/03/18 1400  cefTRIAXone (ROCEPHIN) 2 g in sodium chloride 0.9 % 100 mL IVPB     2 g 200 mL/hr over 30 Minutes Intravenous Every 24 hours 03/02/18 1601     03/02/18 2000  metroNIDAZOLE (FLAGYL) IVPB 500 mg     500 mg 100 mL/hr over 60 Minutes Intravenous Every 8 hours 03/02/18 1601     03/02/18 1330  cefTRIAXone (ROCEPHIN) 2 g in sodium chloride 0.9 % 100 mL IVPB     2 g 200 mL/hr over 30 Minutes Intravenous  Once 03/02/18 1326 03/02/18 1413   03/02/18 1330  metroNIDAZOLE (FLAGYL) IVPB 500 mg     500 mg 100 mL/hr over 60 Minutes Intravenous  Once 03/02/18 1326 03/02/18 1605          Objective:   Vitals:   03/03/18 1305 03/03/18 1437 03/03/18 2040 03/04/18 0525  BP: (!) 159/87 (!) 157/94 (!) 152/93 (!) 168/92  Pulse:  83 79 78  Resp:  (!) 24 18 16   Temp:  97.8 F (36.6 C) 98.7 F (37.1 C) (!) 97.5 F (  36.4 C)  TempSrc:  Oral Oral Oral  SpO2:  97% 97% 96%  Weight:      Height:        Wt Readings from Last 3 Encounters:  03/03/18 93.1 kg  02/28/18 90 kg  02/24/18 89.4 kg     Intake/Output Summary (Last 24 hours) at 03/04/2018 1119 Last data filed at 03/04/2018 1000 Gross per 24 hour  Intake 2337.44 ml  Output 1275 ml  Net 1062.44 ml     Physical Exam  Awake Alert, Oriented X 3, No new F.N deficits, Normal affect [place].AT,PERRAL Supple Neck,No JVD, No cervical lymphadenopathy appriciated.   Symmetrical Chest wall movement, Good air movement bilaterally, CTAB RRR,No Gallops, Rubs or new Murmurs, No Parasternal Heave +ve B.Sounds, Abd Soft, mild epigastric tenderness, No organomegaly appriciated, No rebound - guarding or rigidity. No Cyanosis, Clubbing or edema, No new Rash or bruise    Data Review:    CBC Recent Labs  Lab 03/02/18 0926 03/03/18 0753 03/04/18 0516  WBC 30.4* 29.9* 22.5*  HGB 10.6* 9.6* 8.9*  HCT 33.2* 29.9* 27.8*  PLT 308 274 266  MCV 93.0 92.6 93.0  MCH 29.7 29.7 29.8  MCHC 31.9 32.1 32.0  RDW 12.7 13.0 13.0    Chemistries  Recent Labs  Lab 03/02/18 0926 03/03/18 0753 03/04/18 0516  NA 138 137 137  K 4.5 3.7 3.8  CL 102 100 102  CO2 25 28 27   GLUCOSE 329* 223* 150*  BUN 22* 14 13  CREATININE 1.37* 1.04 1.13  CALCIUM 8.6* 8.4* 8.1*  MG  --  1.6* 2.2  AST 40 19 15  ALT 70* 44 29  ALKPHOS 61 55 51  BILITOT 0.5 0.4 0.5   ------------------------------------------------------------------------------------------------------------------ No results for input(s): CHOL, HDL, LDLCALC, TRIG, CHOLHDL, LDLDIRECT in the last 72 hours.  Lab Results  Component Value Date   HGBA1C 7.3 (H) 02/20/2018   ------------------------------------------------------------------------------------------------------------------ No results for input(s): TSH, T4TOTAL, T3FREE, THYROIDAB in the last 72 hours.  Invalid input(s): FREET3 ------------------------------------------------------------------------------------------------------------------ No results for input(s): VITAMINB12, FOLATE, FERRITIN, TIBC, IRON, RETICCTPCT in the last 72 hours.  Coagulation profile No results for input(s): INR, PROTIME in the last 168 hours.  No results for input(s): DDIMER in the last 72 hours.  Cardiac Enzymes No results for input(s): CKMB, TROPONINI, MYOGLOBIN in the last 168 hours.  Invalid input(s):  CK ------------------------------------------------------------------------------------------------------------------    Component Value Date/Time   BNP 22.4 02/26/2016 1650    Micro Results Recent Results (from the past 240 hour(s))  Culture, Urine     Status: None   Collection Time: 03/02/18  9:46 AM  Result Value Ref Range Status   Specimen Description URINE, RANDOM  Final   Special Requests NONE  Final   Culture   Final    NO GROWTH Performed at [facility], [address], [place], [place] [address]    Report Status 03/04/2018 FINAL  Final  Culture, blood (routine x 2)     Status: None (Preliminary result)   Collection Time: 03/02/18  5:22 PM  Result Value Ref Range Status   Specimen Description BLOOD LEFT ANTECUBITAL  Final   Special Requests   Final    BOTTLES DRAWN AEROBIC ONLY Blood Culture adequate volume   Culture   Final    NO GROWTH 2 DAYS Performed at [facility], [place] [address], [place], [place] [address]    Report Status PENDING  Incomplete  Culture, blood (routine x 2)  Status: None (Preliminary result)   Collection Time: 03/02/18  5:30 PM  Result Value Ref Range Status   Specimen Description BLOOD LEFT HAND  Final   Special Requests   Final    BOTTLES DRAWN AEROBIC ONLY Blood Culture adequate volume   Culture   Final    NO GROWTH 2 DAYS Performed at [facility], [address] [place], [place] [address]    Report Status PENDING  Incomplete    Radiology Reports  Dg Chest 2 View  Result Date: 03/02/2018 CLINICAL DATA:  Weakness, sepsis EXAM: CHEST - 2 VIEW COMPARISON:  02/08/2018, 01/24/2018, CT 01/24/2018 FINDINGS: Patchy left lower lobe opacity. No pleural effusion. Stable cardiomediastinal silhouette. No pneumothorax. IMPRESSION: Low lung volumes. Patchy airspace disease in the left lower lobe may reflect atelectasis or mild pneumonia. Electronically Signed   By: Donavan Foil M.D.   On: 03/02/2018 19:39   Dg Chest 2  View  Result Date: 02/08/2018 CLINICAL DATA:  Chest pain EXAM: CHEST - 2 VIEW COMPARISON:  01/24/2018 chest radiograph. FINDINGS: Stable cardiomediastinal silhouette with normal heart size. No pneumothorax. No pleural effusion. Lungs appear clear, with no acute consolidative airspace disease and no pulmonary edema. IMPRESSION: No active cardiopulmonary disease. Electronically Signed   By: Ilona Sorrel M.D.   On: 02/08/2018 14:00   Ct Abdomen Pelvis W Contrast  Result Date: 03/02/2018 CLINICAL DATA:  Abdominal pain. EXAM: CT ABDOMEN AND PELVIS WITH CONTRAST TECHNIQUE: Multidetector CT imaging of the abdomen and pelvis was performed using the standard protocol following bolus administration of intravenous contrast. CONTRAST:  156mL OMNIPAQUE IOHEXOL 300 MG/ML  SOLN COMPARISON:  CT chest 01/24/2018. FINDINGS: Lower chest: Small left pleural effusion. Subsegmental atelectasis noted in the left lower lobe. Hepatobiliary: No focal liver abnormality is seen. No gallstones, gallbladder wall thickening, or biliary dilatation. Pancreas: Unremarkable. No pancreatic ductal dilatation or surrounding inflammatory changes. Spleen: Normal in size without focal abnormality. Adrenals/Urinary Tract: Normal adrenal glands. Bilateral perinephric fat stranding identified. No mass or hydronephrosis identified. Urinary bladder appears normal. Stomach/Bowel: The stomach is diffusely abnormal in appearance with marked diffuse gastric wall edema and Peri cast strict fat stranding. Areas of intramural ulceration containing gas is identified along the lesser curvature of the gastric fundus and gastric cardia, image 58/6 and image 72/6. No evidence for gastric perforation identified. No significant wall thickening involving the duodenum. No abnormal small bowel wall thickening or inflammation. No pathologic dilatation of the colon. Vascular/Lymphatic: Aortic atherosclerosis. No aneurysm. No upper abdominal adenopathy identified. No pelvic  or inguinal adenopathy. Reproductive: Prostate gland and seminal vesicles are unremarkable. Other: No significant free fluid or fluid collections. Musculoskeletal: No acute or significant osseous findings. IMPRESSION: 1. Marked diffuse gastric wall edema is identified with areas of intramural ulceration in the fundus and gastric cardia. Findings compatible with gastritis. No evidence for perforation. No pneumoperitoneum identified. 2. Mild bilateral perinephric fat stranding identified without hydronephrosis, nonspecific but may be related to prior insult. 3.  Aortic Atherosclerosis (ICD10-I70.0). 4. Small left pleural effusion and left lower lobe subsegmental atelectasis. Electronically Signed   By: Kerby Moors M.D.   On: 03/02/2018 13:24   Mr Foot Right W Wo Contrast  Result Date: 02/21/2018 CLINICAL DATA:  Open wound of the right foot on the first digit. Prior surgeries. Diabetes. EXAM: MRI OF THE RIGHT FOREFOOT WITHOUT AND WITH CONTRAST TECHNIQUE: Multiplanar, multisequence MR imaging of the right forefoot was performed before and after the administration of intravenous contrast. CONTRAST:  8 cc Gadavist  COMPARISON:  Multiple exams, including 01/12/2018 and radiographs of 02/18/2018 FINDINGS: Bones/Joint/Cartilage Low-grade but abnormal edema and enhancement in the phalanges of the great toe; head and shaft of the first metatarsal; and in the head and distal shaft of the second metatarsal suspicious for early osteomyelitis. The first digit sesamoids do not appear to abnormally enhance. Trace effusion of the first MTP joint dorsally. Ligaments Today's exam focused on the distal forefoot, and the level of the Lisfranc ligament was omitted. Muscles and Tendons Abnormal edema signal and enhancement especially in the abductor hallucis muscle compatible with myositis. There is a lesser degree of abnormal muscular edema and enhancement in the interosseous muscles and remaining plantar musculature of the foot,  some of which may be neurogenic. Soft tissues Deep ulceration of the medial aspect of the great toe extending to the periosteal margin of the base of the distal phalanx as shown on image 26/11. Additional ulceration medial to the first MTP joint with surrounding subcutaneous edema and inflammatory stranding compatible with cellulitis which extends into the great toe. Questionable foreign body along the dorsum of the foot on image 6/11, dorsal to the second metatarsal shaft, with some surrounding edema and enhancement. This could also represent a small locule of gas. IMPRESSION: 1. Deep ulceration of the medial great toe extending to the base of the distal phalanx periosteal margin. There is abnormal edema and enhancement in the phalanges of the great toe; in the head and shaft of the first metatarsal; and in the head and distal shaft of the second metatarsal suspicious for early osteomyelitis. 2. 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 3. Cellulitis of the great toe and medial distal foot. 4. Shallow ulceration medial to the first MTP joint. 5. Low signal intensity structure potentially a small foreign body or locule of gas in the dorsal subcutaneous tissues of the foot on image 6/6, dorsal to the level of the second metatarsal, not readily visible on conventional radiography. Electronically Signed   By: Van Clines M.D.   On: 02/21/2018 13:54   Dg Abd Portable 1v  Result Date: 03/04/2018 CLINICAL DATA:  Central abdominal pain for several days EXAM: PORTABLE ABDOMEN - 1 VIEW COMPARISON:  None. FINDINGS: Scattered large and small bowel gas is noted. No obstructive changes are seen. No free air is noted. Vas deferens calcifications are seen. No acute bony abnormality is noted. IMPRESSION: No acute abnormality noted. Electronically Signed   By: Inez Catalina M.D.   On: 03/04/2018 08:45   Dg Foot Complete Right  Result Date:  02/18/2018 CLINICAL DATA:  Diabetic patient with a wound on the right great toe. EXAM: RIGHT FOOT COMPLETE - 3+ VIEW COMPARISON:  Plain films right foot 02/08/2018. MRI right foot 01/12/2018. FINDINGS: Skin wound is seen along the medial aspect of the first MTP joint. Soft tissues of the foot are swollen. No soft tissue gas or radiopaque foreign body. No bony destructive change or periosteal reaction. IMPRESSION: Skin ulceration soft tissue swelling without evidence of osteomyelitis. Electronically Signed   By: Inge Rise M.D.   On: 02/18/2018 11:14   Dg Foot Complete Right  Result Date: 02/08/2018 CLINICAL DATA:  Poor healing surgical wounds to right foot. History of diabetes. EXAM: RIGHT FOOT COMPLETE - 3+ VIEW COMPARISON:  None. FINDINGS: No fractures. No evidence of osteomyelitis. Vascular calcifications are noted. IMPRESSION: No evidence of osteomyelitis. Electronically Signed   By: Dorise Bullion III M.D   On:  02/08/2018 15:14    Time Spent in minutes  30   Lala Lund M.D on 03/04/2018 at 11:19 AM  To page go to [email or web address] - password [facility]

## 2018-03-04 NOTE — Progress Notes (Addendum)
Daily Rounding Note  03/04/2018, 9:21 AM  LOS: 2 days   SUBJECTIVE:   Chief complaint: pain in upper and right abdomen.  Persists, controlled for a while with Dilaudid.  + nausea but no emesis.  + sweats.  Urinating well, urine a bit dark.         OBJECTIVE:         Vital signs in last 24 hours:    Temp:  [97.5 F (36.4 C)-98.7 F (37.1 C)] 97.5 F (36.4 C) (09/18 0525) Pulse Rate:  [78-83] 78 (09/18 0525) Resp:  [16-24] 16 (09/18 0525) BP: (152-182)/(87-99) 168/92 (09/18 0525) SpO2:  [96 %-97 %] 96 % (09/18 0525) Last BM Date: 03/01/18 Filed Weights   03/03/18 0449 03/03/18 0559  Weight: 92.7 kg 93.1 kg   General: looks ok.  No diaphoresis.  Comfortable currently.     Heart: RRR Chest: no dyspnea or cough.  Clear bil.   Abdomen: soft, scant BS but no tinkling or increased tympanic quality.  Tender without guarding in epigastrum, RUQ and right abdomen.    Extremities: no CCE.  Bandaged right foot.   Neuro/Psych:  Alert, pleasant, calm.  No weakness, gross deficits, tremors.    Intake/Output from previous day: 09/17 0701 - 09/18 0700 In: 1702.9 [I.V.:1388.6; IV Piggyback:314.3] Out: 1275 [Urine:1275]   Lab Results: Recent Labs    03/02/18 0926 03/03/18 0753 03/04/18 0516  WBC 30.4* 29.9* 22.5*  HGB 10.6* 9.6* 8.9*  HCT 33.2* 29.9* 27.8*  PLT 308 274 266   BMET Recent Labs    03/02/18 0926 03/03/18 0753 03/04/18 0516  NA 138 137 137  K 4.5 3.7 3.8  CL 102 100 102  CO2 25 28 27   GLUCOSE 329* 223* 150*  BUN 22* 14 13  CREATININE 1.37* 1.04 1.13  CALCIUM 8.6* 8.4* 8.1*   LFT Recent Labs    03/02/18 0926 03/03/18 0753 03/04/18 0516  PROT 6.6 6.6 5.5*  ALBUMIN 2.8* 2.6* 2.3*  AST 40 19 15  ALT 70* 44 29  ALKPHOS 61 55 51  BILITOT 0.5 0.4 0.5    Dg Abd Portable 1v  Result Date: 03/04/2018 CLINICAL DATA:  Central abdominal pain for several days EXAM: PORTABLE ABDOMEN - 1 VIEW  COMPARISON:  None. FINDINGS: Scattered large and small bowel gas is noted. No obstructive changes are seen. No free air is noted. Vas deferens calcifications are seen. No acute bony abnormality is noted. IMPRESSION: No acute abnormality noted. Electronically Signed   By: Inez Catalina M.D.   On: 03/04/2018 08:45    ASSESMENT:   *  Gastric inflammation, edema, ulceration acutely following right partial foot amputation.  Suspect peri-operative ischemic, embolic event  Day 3 Ceftriaxone, Metronidazole.  IV BID PPI in place.  NPO x meds/chips/sips.   Abdominal films look ok, WBCs improved, no fever but pain, nausea  continues.    *   Normocytic anemia.    *   ASPVD.  Non-healing right foot ulcer.  S/p right partial foot amputation 9/13.  Previous left partial foot amputation.    PLAN   *  Requested genl surgery consult, informed pt of this.  Maintain current abx, limited po, IV PPI, IVF.      [name]  03/04/2018, 9:21 AM Phone [place] Attending   I have taken an interval history, reviewed the chart and examined the patient. I agree with the Advanced Practitioner's note,  impression and recommendations.   His #'s are better - he says pain is worse Exam seems similar - quiet and distended abd w/ some upper tenderness No flatus or BM  We are getting G[facility] to weigh in -   D/w Dr. Candiss Norse and patient  Gatha Mayer, MD, [facility] 03/04/2018 11:26 AM

## 2018-03-05 DIAGNOSIS — R1013 Epigastric pain: Secondary | ICD-10-CM

## 2018-03-05 DIAGNOSIS — R933 Abnormal findings on diagnostic imaging of other parts of digestive tract: Secondary | ICD-10-CM

## 2018-03-05 DIAGNOSIS — K2900 Acute gastritis without bleeding: Secondary | ICD-10-CM

## 2018-03-05 LAB — CBC
HCT: 30.1 % — ABNORMAL LOW (ref 39.0–52.0)
Hemoglobin: 9.5 g/dL — ABNORMAL LOW (ref 13.0–17.0)
MCH: 29.8 pg (ref 26.0–34.0)
MCHC: 31.6 g/dL (ref 30.0–36.0)
MCV: 94.4 fL (ref 78.0–100.0)
PLATELETS: 288 10*3/uL (ref 150–400)
RBC: 3.19 MIL/uL — ABNORMAL LOW (ref 4.22–5.81)
RDW: 12.9 % (ref 11.5–15.5)
WBC: 20.7 10*3/uL — AB (ref 4.0–10.5)

## 2018-03-05 LAB — COMPREHENSIVE METABOLIC PANEL
ALT: 24 U/L (ref 0–44)
AST: 17 U/L (ref 15–41)
Albumin: 2.4 g/dL — ABNORMAL LOW (ref 3.5–5.0)
Alkaline Phosphatase: 63 U/L (ref 38–126)
Anion gap: 6 (ref 5–15)
BUN: 12 mg/dL (ref 6–20)
CALCIUM: 8.1 mg/dL — AB (ref 8.9–10.3)
CO2: 27 mmol/L (ref 22–32)
Chloride: 104 mmol/L (ref 98–111)
Creatinine, Ser: 1.15 mg/dL (ref 0.61–1.24)
GFR calc Af Amer: >60 mL/min (ref >60)
Glucose, Bld: 72 mg/dL (ref 70–99)
Potassium: 3.7 mmol/L (ref 3.5–5.1)
Sodium: 137 mmol/L (ref 135–145)
TOTAL PROTEIN: 5.6 g/dL — AB (ref 6.5–8.1)
Total Bilirubin: 0.5 mg/dL (ref 0.3–1.2)

## 2018-03-05 LAB — CBC WITH DIFFERENTIAL/PLATELET
Abs Immature Granulocytes: 0.1 10*3/uL (ref 0.0–0.1)
Basophils Absolute: 0.1 10*3/uL (ref 0.0–0.1)
Basophils Relative: 0 %
EOS ABS: 0.4 10*3/uL (ref 0.0–0.7)
EOS PCT: 2 %
HEMATOCRIT: 28.9 % — AB (ref 39.0–52.0)
Hemoglobin: 9 g/dL — ABNORMAL LOW (ref 13.0–17.0)
IMMATURE GRANULOCYTES: 0 %
LYMPHS ABS: 2.1 10*3/uL (ref 0.7–4.0)
Lymphocytes Relative: 13 %
MCH: 29.3 pg (ref 26.0–34.0)
MCHC: 31.1 g/dL (ref 30.0–36.0)
MCV: 94.1 fL (ref 78.0–100.0)
MONOS PCT: 6 %
Monocytes Absolute: 0.9 10*3/uL (ref 0.1–1.0)
NEUTROS PCT: 79 %
Neutro Abs: 12.5 10*3/uL — ABNORMAL HIGH (ref 1.7–7.7)
Platelets: 281 10*3/uL (ref 150–400)
RBC: 3.07 MIL/uL — ABNORMAL LOW (ref 4.22–5.81)
RDW: 12.8 % (ref 11.5–15.5)
WBC: 15.9 10*3/uL — ABNORMAL HIGH (ref 4.0–10.5)

## 2018-03-05 LAB — GLUCOSE, CAPILLARY
GLUCOSE-CAPILLARY: 84 mg/dL (ref 70–99)
Glucose-Capillary: 108 mg/dL — ABNORMAL HIGH (ref 70–99)
Glucose-Capillary: 79 mg/dL (ref 70–99)
Glucose-Capillary: 79 mg/dL (ref 70–99)
Glucose-Capillary: 83 mg/dL (ref 70–99)
Glucose-Capillary: 86 mg/dL (ref 70–99)

## 2018-03-05 LAB — MAGNESIUM: MAGNESIUM: 2.1 mg/dL (ref 1.7–2.4)

## 2018-03-05 LAB — EOSINOPHIL COUNT: Eosinophils Absolute: 0.4 10*3/uL (ref 0.0–0.7)

## 2018-03-05 LAB — H. PYLORI ANTIBODY, IGG: H PYLORI IGG: 0.15 {index_val} (ref 0.00–0.79)

## 2018-03-05 MED ORDER — KCL IN DEXTROSE-NACL 20-5-0.9 MEQ/L-%-% IV SOLN
INTRAVENOUS | Status: DC
  Administered 2018-03-05: 22:00:00 via INTRAVENOUS
  Administered 2018-03-05 – 2018-03-06 (×2): 1000 mL via INTRAVENOUS
  Filled 2018-03-05 (×5): qty 1000

## 2018-03-05 NOTE — Progress Notes (Addendum)
Daily Rounding Note  03/05/2018, 8:34 AM  LOS: 3 days   ASSESMENT:   *  Gastric inflammation, edema, ulceration acutely following right partial foot amputation.  Suspect peri-operative ischemic, embolic event  - see below for change in thoughts  Day 3 Ceftriaxone, Metronidazole.  IV BID PPI, Carafate in place.  NPO x meds/chips/sips.   Abdominal films look ok, WBCs slowly better but still significantly elevated, no fevers.  Appreciate Dr Brantley Stage and PA Creig Hines eval.       *   Normocytic anemia.    *   ASPVD.  Non-healing right foot ulcer.  S/p right first ray amputation 9/13.  No issues seen on exam by Dr Sharol Given 9/18.  Previous left partial foot amputation.    PLAN   *   Continue Abx,  IVF, NPO x meds/chips.      [name]  03/05/2018, 8:34 AM Phone [place] Attending   I have taken an interval history, reviewed the chart and examined the patient. I agree with the Advanced Practitioner's note, impression and recommendations.     He is about the same I reviewed the CT images with radiologist and also spoke to Dr. Brantley Stage. My theory of a vascular occlusion is not likely given it would be difficult to have isolated gastric problems AND the vessels feeding stomach look patent.  One possibility is eosinophilic gastritis - I thought we had a differential but do not so will check that and eos count.  If that shows eosinophilia start steroids possibly  I think I will most likely repeat a CT scan also - and depending upon what that shows do an EGD  I explained to patient.  Gatha Mayer, MD, [facility] 03/05/2018 3:47 PM     SUBJECTIVE:   Chief complaint:     Still c/o abdominal pain, not a lot better.  No nausea.  No BM but passing flatus.  Feels SOB as he has since admission, no cough.    OBJECTIVE:         Vital signs in last 24 hours:    Temp:  [98.3 F (36.8  C)-98.5 F (36.9 C)] 98.3 F (36.8 C) (09/18 2105) Pulse Rate:  [65-77] 65 (09/18 2319) Resp:  [16-17] 16 (09/18 2105) BP: (145-160)/(82-96) 145/82 (09/18 2105) SpO2:  [89 %-98 %] 98 % (09/18 2319) Last BM Date: 03/01/18 Filed Weights   03/03/18 0449 03/03/18 0559  Weight: 92.7 kg 93.1 kg   General: looks well   Heart: RRR Chest: clear bil but diminished BS in bases.  No dyspnea.   Abdomen: soft, distended.  BS hypoactive but no increased tympany or high-pitched BS.  Minimal tenderness across uppr abdomen bil.    Extremities: wrapped right foot, forefoot amputated on left.  No CCE Neuro/Psych:  Oriented  X  3, fully alert, cooperative, mildly anxious.    Intake/Output from previous day: 09/18 0701 - 09/19 0700 In: 1039.7 [I.V.:939.7; IV Piggyback:100] Out: 475 [Urine:475]   Lab Results: Recent Labs    03/03/18 0753 03/04/18 0516 03/05/18 0454  WBC 29.9* 22.5* 20.7*  HGB 9.6* 8.9* 9.5*  HCT 29.9* 27.8* 30.1*  PLT 274 266 288   BMET Recent Labs    03/03/18 0753 03/04/18 0516 03/05/18 0454  NA 137 137 137  K 3.7 3.8 3.7  CL 100 102 104  CO2 28 27 27   GLUCOSE 223* 150* 72  BUN  14 13 12   CREATININE 1.04 1.13 1.15  CALCIUM 8.4* 8.1* 8.1*   LFT Recent Labs    03/03/18 0753 03/04/18 0516 03/05/18 0454  PROT 6.6 5.5* 5.6*  ALBUMIN 2.6* 2.3* 2.4*  AST 19 15 17   ALT 44 29 24  ALKPHOS 55 51 63  BILITOT 0.4 0.5 0.5    Studies/Results: Dg Abd Portable 1v  Result Date: 03/04/2018 CLINICAL DATA:  Central abdominal pain for several days EXAM: PORTABLE ABDOMEN - 1 VIEW COMPARISON:  None. FINDINGS: Scattered large and small bowel gas is noted. No obstructive changes are seen. No free air is noted. Vas deferens calcifications are seen. No acute bony abnormality is noted. IMPRESSION: No acute abnormality noted. Electronically Signed   By: Inez Catalina M.D.   On: 03/04/2018 08:45

## 2018-03-05 NOTE — Care Management Important Message (Signed)
Important Message  Patient Details  Name: Shane [name] MRN: [id number] Date of Birth: Jul 30, 1970   Medicare Important Message Given:  Yes    Shannin Naab [address] 03/05/2018, 3:11 PM

## 2018-03-05 NOTE — Progress Notes (Signed)
@IPLOG @        PROGRESS NOTE                                                                                                                                                                                                             Patient Demographics:    Shane [name], is a 47 y.o. male, DOB - [birth year], JWJ:[id number]  Admit date - 03/02/2018   Admitting Physician Karmen Bongo, MD  Outpatient Primary MD for the patient is Charlott Rakes, MD  LOS - 3  Chief Complaint  Patient presents with  . Abdominal Pain       Brief Narrative  Shane [name] is a 47 y.o. male with medical history significant of DM; CVA; Jehovah's witness; chronic diabetic foot ulcer s/p I&D on 8/7, recently completed 1 month of doxycycline, with subsequent R foot cellulitis requiring hospitalization through 9/8 and first ray amputation 9/13; lower GI bleed; HTN; HLD; bipolar; and chronic diastolic CHF presenting with abdominal pain found to have severe gastritis on CT scan.   Subjective:   Patient in bed, appears comfortable, denies any headache, no fever, no chest pain or pressure, no shortness of breath , still having epigastric abdominal pain. No focal weakness.   Assessment  & Plan :     1. Severe gastritis.  Continue supportive care with bowel rest, IV PPI, Carafate, IV fluids for hydration, discussed the case with GI physician Dr. Michelene Heady, suspicion for possible ischemic insult, continue empiric antibiotics, CT abdomen pelvis along with abdominal x-ray have been noted, now general surgery on board as well, defer management of this problem to GI and general surgery.   2.  Recent right diabetic foot gangrene requiring surgery by Dr. Sharol Given last week.  Continue local wound care, no weightbearing in the right foot, Dr. Sharol Given evaluated the patient on 03/04/2018 here.  3.  DM type II.  Currently on Lantus and sliding scale will monitor.  Will reduce Lantus dose as oral intake is less than usual.  Lab  Results  Component Value Date   HGBA1C 7.3 (H) 02/20/2018   CBG (last 3)  Recent Labs    03/05/18 0005 03/05/18 0456 03/05/18 0733  GLUCAP 83 84 86    4.  Dyslipidemia.  On statin continue.  5.  Essential hypertension.  Currently on combination of Norvasc, Coreg, have added oral scheduled hydralazine for better control along with PRN hydralazine.  Monitor and adjust.  6.  BPH.  On Flomax continue.  7.  Chronic diastolic CHF.  EF  60% on echocardiogram done in February 2018.  Currently compensated.    Family Communication  :  None  Code Status :  Full  Disposition Plan  :  Tele  Consults  :  GI, general surgery, orthopedics  Procedures  :   CT - 1. Marked diffuse gastric wall edema is identified with areas of intramural ulceration in the fundus and gastric cardia. Findings compatible with gastritis. No evidence for perforation. No pneumoperitoneum identified. 2. Mild bilateral perinephric fat stranding identified without hydronephrosis, nonspecific but may be related to prior insult. 3.  Aortic Atherosclerosis (ICD10-I70.0). 4. Small left pleural effusion and left lower lobe subsegmental atelectasis  DVT Prophylaxis  :  Lovenox   Lab Results  Component Value Date   PLT 288 03/05/2018    Diet :  Diet Order            Diet NPO time specified Except for: Ice Chips, Sips with Meds  Diet effective now               Inpatient Medications Scheduled Meds: . amLODipine  5 mg Oral Daily  . atorvastatin  80 mg Oral q morning - 10a  . carbamazepine  200 mg Oral QHS  . carvedilol  25 mg Oral BID WC  . docusate sodium  100 mg Oral BID  . enoxaparin (LOVENOX) injection  40 mg Subcutaneous Q24H  . FLUoxetine  40 mg Oral Daily  . gabapentin  300 mg Oral QHS  . gi cocktail  30 mL Oral TID  . hydrALAZINE  50 mg Oral Q8H  . insulin aspart  0-15 Units Subcutaneous TID WC  . insulin aspart  0-5 Units Subcutaneous QHS  . insulin glargine  20 Units Subcutaneous BID  .  pantoprazole (PROTONIX) IV  40 mg Intravenous Q12H  . sodium chloride flush  3 mL Intravenous Q12H  . sucralfate  1 g Oral TID WC & HS  . tamsulosin  0.4 mg Oral Daily  . traZODone  100 mg Oral QHS   Continuous Infusions: . cefTRIAXone (ROCEPHIN)  IV 2 g (03/04/18 1317)  . dextrose 5 % and 0.9 % NaCl with KCl 20 mEq/L    . metronidazole 500 mg (03/05/18 0507)   PRN Meds:.hydrALAZINE, HYDROmorphone (DILAUDID) injection, [DISCONTINUED] ondansetron **OR** ondansetron (ZOFRAN) IV  Antibiotics  :   Anti-infectives (From admission, onward)   Start     Dose/Rate Route Frequency Ordered Stop   03/03/18 1400  cefTRIAXone (ROCEPHIN) 2 g in sodium chloride 0.9 % 100 mL IVPB     2 g 200 mL/hr over 30 Minutes Intravenous Every 24 hours 03/02/18 1601     03/02/18 2000  metroNIDAZOLE (FLAGYL) IVPB 500 mg     500 mg 100 mL/hr over 60 Minutes Intravenous Every 8 hours 03/02/18 1601     03/02/18 1330  cefTRIAXone (ROCEPHIN) 2 g in sodium chloride 0.9 % 100 mL IVPB     2 g 200 mL/hr over 30 Minutes Intravenous  Once 03/02/18 1326 03/02/18 1413   03/02/18 1330  metroNIDAZOLE (FLAGYL) IVPB 500 mg     500 mg 100 mL/hr over 60 Minutes Intravenous  Once 03/02/18 1326 03/02/18 1605          Objective:   Vitals:   03/04/18 0525 03/04/18 1628 03/04/18 2105 03/04/18 2319  BP: (!) 168/92 (!) 160/96 (!) 145/82   Pulse: 78 77 70 65  Resp: 16 17 16    Temp: (!) 97.5 F (36.4 C) 98.5 F (36.9 C)  98.3 F (36.8 C)   TempSrc: Oral Oral Oral   SpO2: 96% 95% (!) 89% 98%  Weight:      Height:        Wt Readings from Last 3 Encounters:  03/03/18 93.1 kg  02/28/18 90 kg  02/24/18 89.4 kg     Intake/Output Summary (Last 24 hours) at 03/05/2018 1013 Last data filed at 03/05/2018 0956 Gross per 24 hour  Intake 565.14 ml  Output 475 ml  Net 90.14 ml     Physical Exam  Awake Alert, Oriented X 3, No new F.N deficits, Normal affect [place].AT,PERRAL Supple Neck,No JVD, No cervical lymphadenopathy  appriciated.  Symmetrical Chest wall movement, Good air movement bilaterally, CTAB RRR,No Gallops, Rubs or new Murmurs, No Parasternal Heave +ve B.Sounds, Abd Soft, mild epigastric tenderness, No organomegaly appriciated, No rebound - guarding or rigidity. No Cyanosis, Clubbing or edema, No new Rash or bruise     Data Review:    CBC Recent Labs  Lab 03/02/18 0926 03/03/18 0753 03/04/18 0516 03/05/18 0454  WBC 30.4* 29.9* 22.5* 20.7*  HGB 10.6* 9.6* 8.9* 9.5*  HCT 33.2* 29.9* 27.8* 30.1*  PLT 308 274 266 288  MCV 93.0 92.6 93.0 94.4  MCH 29.7 29.7 29.8 29.8  MCHC 31.9 32.1 32.0 31.6  RDW 12.7 13.0 13.0 12.9    Chemistries  Recent Labs  Lab 03/02/18 0926 03/03/18 0753 03/04/18 0516 03/05/18 0454  NA 138 137 137 137  K 4.5 3.7 3.8 3.7  CL 102 100 102 104  CO2 25 28 27 27   GLUCOSE 329* 223* 150* 72  BUN 22* 14 13 12   CREATININE 1.37* 1.04 1.13 1.15  CALCIUM 8.6* 8.4* 8.1* 8.1*  MG  --  1.6* 2.2 2.1  AST 40 19 15 17   ALT 70* 44 29 24  ALKPHOS 61 55 51 63  BILITOT 0.5 0.4 0.5 0.5   ------------------------------------------------------------------------------------------------------------------ No results for input(s): CHOL, HDL, LDLCALC, TRIG, CHOLHDL, LDLDIRECT in the last 72 hours.  Lab Results  Component Value Date   HGBA1C 7.3 (H) 02/20/2018   ------------------------------------------------------------------------------------------------------------------ No results for input(s): TSH, T4TOTAL, T3FREE, THYROIDAB in the last 72 hours.  Invalid input(s): FREET3 ------------------------------------------------------------------------------------------------------------------ No results for input(s): VITAMINB12, FOLATE, FERRITIN, TIBC, IRON, RETICCTPCT in the last 72 hours.  Coagulation profile No results for input(s): INR, PROTIME in the last 168 hours.  No results for input(s): DDIMER in the last 72 hours.  Cardiac Enzymes No results for input(s):  CKMB, TROPONINI, MYOGLOBIN in the last 168 hours.  Invalid input(s): CK ------------------------------------------------------------------------------------------------------------------    Component Value Date/Time   BNP 22.4 02/26/2016 1650    Micro Results Recent Results (from the past 240 hour(s))  Culture, Urine     Status: None   Collection Time: 03/02/18  9:46 AM  Result Value Ref Range Status   Specimen Description URINE, RANDOM  Final   Special Requests NONE  Final   Culture   Final    NO GROWTH Performed at [facility], [address], [place], [place] [address]    Report Status 03/04/2018 FINAL  Final  Culture, blood (routine x 2)     Status: None (Preliminary result)   Collection Time: 03/02/18  5:22 PM  Result Value Ref Range Status   Specimen Description BLOOD LEFT ANTECUBITAL  Final   Special Requests   Final    BOTTLES DRAWN AEROBIC ONLY Blood Culture adequate volume   Culture   Final    NO GROWTH 3 DAYS  Performed at [facility], [place] [address], [place], [place] [address]    Report Status PENDING  Incomplete  Culture, blood (routine x 2)     Status: None (Preliminary result)   Collection Time: 03/02/18  5:30 PM  Result Value Ref Range Status   Specimen Description BLOOD LEFT HAND  Final   Special Requests   Final    BOTTLES DRAWN AEROBIC ONLY Blood Culture adequate volume   Culture   Final    NO GROWTH 3 DAYS Performed at [facility], [place] [address], [place], [place] [address]    Report Status PENDING  Incomplete    Radiology Reports  Dg Chest 2 View  Result Date: 03/02/2018 CLINICAL DATA:  Weakness, sepsis EXAM: CHEST - 2 VIEW COMPARISON:  02/08/2018, 01/24/2018, CT 01/24/2018 FINDINGS: Patchy left lower lobe opacity. No pleural effusion. Stable cardiomediastinal silhouette. No pneumothorax. IMPRESSION: Low lung volumes. Patchy airspace disease in the left lower lobe may reflect atelectasis or mild pneumonia. Electronically Signed    By: Donavan Foil M.D.   On: 03/02/2018 19:39   Dg Chest 2 View  Result Date: 02/08/2018 CLINICAL DATA:  Chest pain EXAM: CHEST - 2 VIEW COMPARISON:  01/24/2018 chest radiograph. FINDINGS: Stable cardiomediastinal silhouette with normal heart size. No pneumothorax. No pleural effusion. Lungs appear clear, with no acute consolidative airspace disease and no pulmonary edema. IMPRESSION: No active cardiopulmonary disease. Electronically Signed   By: Ilona Sorrel M.D.   On: 02/08/2018 14:00   Ct Abdomen Pelvis W Contrast  Result Date: 03/02/2018 CLINICAL DATA:  Abdominal pain. EXAM: CT ABDOMEN AND PELVIS WITH CONTRAST TECHNIQUE: Multidetector CT imaging of the abdomen and pelvis was performed using the standard protocol following bolus administration of intravenous contrast. CONTRAST:  150mL OMNIPAQUE IOHEXOL 300 MG/ML  SOLN COMPARISON:  CT chest 01/24/2018. FINDINGS: Lower chest: Small left pleural effusion. Subsegmental atelectasis noted in the left lower lobe. Hepatobiliary: No focal liver abnormality is seen. No gallstones, gallbladder wall thickening, or biliary dilatation. Pancreas: Unremarkable. No pancreatic ductal dilatation or surrounding inflammatory changes. Spleen: Normal in size without focal abnormality. Adrenals/Urinary Tract: Normal adrenal glands. Bilateral perinephric fat stranding identified. No mass or hydronephrosis identified. Urinary bladder appears normal. Stomach/Bowel: The stomach is diffusely abnormal in appearance with marked diffuse gastric wall edema and Peri cast strict fat stranding. Areas of intramural ulceration containing gas is identified along the lesser curvature of the gastric fundus and gastric cardia, image 58/6 and image 72/6. No evidence for gastric perforation identified. No significant wall thickening involving the duodenum. No abnormal small bowel wall thickening or inflammation. No pathologic dilatation of the colon. Vascular/Lymphatic: Aortic atherosclerosis. No  aneurysm. No upper abdominal adenopathy identified. No pelvic or inguinal adenopathy. Reproductive: Prostate gland and seminal vesicles are unremarkable. Other: No significant free fluid or fluid collections. Musculoskeletal: No acute or significant osseous findings. IMPRESSION: 1. Marked diffuse gastric wall edema is identified with areas of intramural ulceration in the fundus and gastric cardia. Findings compatible with gastritis. No evidence for perforation. No pneumoperitoneum identified. 2. Mild bilateral perinephric fat stranding identified without hydronephrosis, nonspecific but may be related to prior insult. 3.  Aortic Atherosclerosis (ICD10-I70.0). 4. Small left pleural effusion and left lower lobe subsegmental atelectasis. Electronically Signed   By: Kerby Moors M.D.   On: 03/02/2018 13:24   Mr Foot Right W Wo Contrast  Result Date: 02/21/2018 CLINICAL DATA:  Open wound of the right foot on the first digit. Prior surgeries. Diabetes. EXAM: MRI OF  THE RIGHT FOREFOOT WITHOUT AND WITH CONTRAST TECHNIQUE: Multiplanar, multisequence MR imaging of the right forefoot was performed before and after the administration of intravenous contrast. CONTRAST:  8 cc Gadavist COMPARISON:  Multiple exams, including 01/12/2018 and radiographs of 02/18/2018 FINDINGS: Bones/Joint/Cartilage Low-grade but abnormal edema and enhancement in the phalanges of the great toe; head and shaft of the first metatarsal; and in the head and distal shaft of the second metatarsal suspicious for early osteomyelitis. The first digit sesamoids do not appear to abnormally enhance. Trace effusion of the first MTP joint dorsally. Ligaments Today's exam focused on the distal forefoot, and the level of the Lisfranc ligament was omitted. Muscles and Tendons Abnormal edema signal and enhancement especially in the abductor hallucis muscle compatible with myositis. There is a lesser degree of abnormal muscular edema and enhancement in the  interosseous muscles and remaining plantar musculature of the foot, some of which may be neurogenic. Soft tissues Deep ulceration of the medial aspect of the great toe extending to the periosteal margin of the base of the distal phalanx as shown on image 26/11. Additional ulceration medial to the first MTP joint with surrounding subcutaneous edema and inflammatory stranding compatible with cellulitis which extends into the great toe. Questionable foreign body along the dorsum of the foot on image 6/11, dorsal to the second metatarsal shaft, with some surrounding edema and enhancement. This could also represent a small locule of gas. IMPRESSION: 1. Deep ulceration of the medial great toe extending to the base of the distal phalanx periosteal margin. There is abnormal edema and enhancement in the phalanges of the great toe; in the head and shaft of the first metatarsal; and in the head and distal shaft of the second metatarsal suspicious for early osteomyelitis. 2. 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 3. Cellulitis of the great toe and medial distal foot. 4. Shallow ulceration medial to the first MTP joint. 5. Low signal intensity structure potentially a small foreign body or locule of gas in the dorsal subcutaneous tissues of the foot on image 6/6, dorsal to the level of the second metatarsal, not readily visible on conventional radiography. Electronically Signed   By: Van Clines M.D.   On: 02/21/2018 13:54   Dg Abd Portable 1v  Result Date: 03/04/2018 CLINICAL DATA:  Central abdominal pain for several days EXAM: PORTABLE ABDOMEN - 1 VIEW COMPARISON:  None. FINDINGS: Scattered large and small bowel gas is noted. No obstructive changes are seen. No free air is noted. Vas deferens calcifications are seen. No acute bony abnormality is noted. IMPRESSION: No acute abnormality noted. Electronically Signed   By: Inez Catalina M.D.    On: 03/04/2018 08:45   Dg Foot Complete Right  Result Date: 02/18/2018 CLINICAL DATA:  Diabetic patient with a wound on the right great toe. EXAM: RIGHT FOOT COMPLETE - 3+ VIEW COMPARISON:  Plain films right foot 02/08/2018. MRI right foot 01/12/2018. FINDINGS: Skin wound is seen along the medial aspect of the first MTP joint. Soft tissues of the foot are swollen. No soft tissue gas or radiopaque foreign body. No bony destructive change or periosteal reaction. IMPRESSION: Skin ulceration soft tissue swelling without evidence of osteomyelitis. Electronically Signed   By: Inge Rise M.D.   On: 02/18/2018 11:14   Dg Foot Complete Right  Result Date: 02/08/2018 CLINICAL DATA:  Poor healing surgical wounds to right foot. History of diabetes. EXAM: RIGHT FOOT COMPLETE - 3+ VIEW COMPARISON:  None. FINDINGS: No fractures. No evidence of osteomyelitis. Vascular calcifications are noted. IMPRESSION: No evidence of osteomyelitis. Electronically Signed   By: Dorise Bullion III M.D   On: 02/08/2018 15:14    Time Spent in minutes  30   Lala Lund M.D on 03/05/2018 at 10:13 AM  To page go to [email or web address] - password [facility]

## 2018-03-05 NOTE — Evaluation (Signed)
Physical Therapy Evaluation Patient Details Name: Shane [name] MRN: [id number] DOB: 05-24-1971 Today's Date: 03/05/2018   History of Present Illness  Patient is a 47 y/o male presenting to the ED on 03/02/18 with primary complaints of abdominal pain. Of note, recent hospitalization for R first ray amputation. CT with marked gastritis with intramural ulceration. PMH significant for DM; CVA; Jehovah's witness; chronic diabetic foot ulcer s/p I&D on 8/7, recently completed 1 month of doxycycline, with subsequent R foot cellulitis requiring hospitalization through 9/8 and first ray amputation 9/13; lower GI bleed; HTN; HLD; bipolar; and chronic diastolic CHF.    Clinical Impression  Mr. Violett admitted with the above listed diagnosis. Patient reports that prior to admission he was Mod I with mobility with RW vs w/c due to recent R first ray amputation. Patient today only performing sit to stand transfers at bedside due to abdominal discomfort limiting - requires Min guard for safety. PT to continue to follow to ensure safety with transfers/functional mobility prior to d/c to venue listed below.     Follow Up Recommendations No PT follow up;Supervision for mobility/OOB    Equipment Recommendations  None recommended by PT    Recommendations for Other Services       Precautions / Restrictions Precautions Precautions: Fall Restrictions Weight Bearing Restrictions: Yes RLE Weight Bearing: Non weight bearing      Mobility  Bed Mobility Overal bed mobility: Needs Assistance Bed Mobility: Supine to Sit;Sit to Supine     Supine to sit: Supervision Sit to supine: Supervision   General bed mobility comments: increased time and effort  Transfers Overall transfer level: Needs assistance Equipment used: Rolling walker (2 wheeled) Transfers: Sit to/from Stand Sit to Stand: Min guard         General transfer comment: for safety and immediate standing balance   Ambulation/Gait             General Gait Details: deferred  Stairs            Wheelchair Mobility    Modified Rankin (Stroke Patients Only)       Balance Overall balance assessment: Needs assistance Sitting-balance support: No upper extremity supported Sitting balance-Leahy Scale: Good     Standing balance support: Bilateral upper extremity supported Standing balance-Leahy Scale: Poor Standing balance comment: Reliant on BUE support                             Pertinent Vitals/Pain Pain Assessment: Faces Faces Pain Scale: Hurts even more Pain Location: abdomen Pain Descriptors / Indicators: Aching;Discomfort;Grimacing Pain Intervention(s): Limited activity within patient's tolerance;Monitored during session    Home Living Family/patient expects to be discharged to:: Private residence Living Arrangements: Spouse/significant other;Children Available Help at Discharge: Family;Available 24 hours/day Type of Home: Apartment Home Access: Level entry     Home Layout: One level Home Equipment: Walker - 4 wheels;Cane - single point;Wheelchair - manual      Prior Function Level of Independence: Needs assistance   Gait / Transfers Assistance Needed: was using w/c for mobility, could perform transfers himself and mobilize himself in w/c  ADL's / Homemaking Assistance Needed: required assistance with dressing and bathing from wife        Hand Dominance   Dominant Hand: Right    Extremity/Trunk Assessment   Upper Extremity Assessment Upper Extremity Assessment: Defer to OT evaluation    Lower Extremity Assessment Lower Extremity Assessment: RLE deficits/detail;LLE deficits/detail RLE Deficits / Details: able  to maintain NWB R LE in static stance LLE Deficits / Details: L transmetatarsal amputation    Cervical / Trunk Assessment Cervical / Trunk Assessment: Normal  Communication   Communication: No difficulties  Cognition Arousal/Alertness: Awake/alert Behavior  During Therapy: WFL for tasks assessed/performed Overall Cognitive Status: Within Functional Limits for tasks assessed                                        General Comments      Exercises     Assessment/Plan    PT Assessment Patient needs continued PT services  PT Problem List Decreased strength;Decreased balance;Decreased activity tolerance;Decreased mobility;Decreased knowledge of use of DME;Pain       PT Treatment Interventions DME instruction;Gait training;Functional mobility training;Therapeutic activities;Balance training;Therapeutic exercise;Patient/family education    PT Goals (Current goals can be found in the Care Plan section)  Acute Rehab PT Goals Patient Stated Goal: improve pain PT Goal Formulation: With patient Time For Goal Achievement: 03/19/18 Potential to Achieve Goals: Good    Frequency Min 3X/week   Barriers to discharge        Co-evaluation               AM-PAC PT "6 Clicks" Daily Activity  Outcome Measure Difficulty turning over in bed (including adjusting bedclothes, sheets and blankets)?: A Little Difficulty moving from lying on back to sitting on the side of the bed? : A Little Difficulty sitting down on and standing up from a chair with arms (e.g., wheelchair, bedside commode, etc,.)?: Unable Help needed moving to and from a bed to chair (including a wheelchair)?: A Little Help needed walking in hospital room?: A Lot Help needed climbing 3-5 steps with a railing? : Total 6 Click Score: 13    End of Session Equipment Utilized During Treatment: Gait belt Activity Tolerance: Patient tolerated treatment well Patient left: in bed;with call bell/phone within reach Nurse Communication: Mobility status PT Visit Diagnosis: Unsteadiness on feet (R26.81);Other abnormalities of gait and mobility (R26.89) Pain - part of body: (abdomen)    Time: 4098-1191 PT Time Calculation (min) (ACUTE ONLY): 18 min   Charges:   PT  Evaluation $PT Eval Low Complexity: 1 Low        Lanney Gins, PT, DPT Supplemental Physical Therapist 03/05/18 12:17 PM Pager: [phone] Office: [phone]

## 2018-03-05 NOTE — Progress Notes (Signed)
CC:  Abdominal pain  Subjective: He says he does not feel better, but exam is unchanged.  No peritonitis.    Objective: Vital signs in last 24 hours: Temp:  [98.3 F (36.8 C)-98.5 F (36.9 C)] 98.3 F (36.8 C) (09/18 2105) Pulse Rate:  [65-77] 65 (09/18 2319) Resp:  [16-17] 16 (09/18 2105) BP: (145-160)/(82-96) 145/82 (09/18 2105) SpO2:  [89 %-98 %] 98 % (09/18 2319) Last BM Date: 03/01/18 NPO 1039 IV ordered Urine 475 Afebrile, VSS WBC slowly improving CMP OK ABX 1 view yesterday:  No free air  Intake/Output from previous day: 09/18 0701 - 09/19 0700 In: 1039.7 [I.V.:939.7; IV Piggyback:100] Out: 475 [Urine:475] Intake/Output this shift: Total I/O In: 60 [P.O.:60] Out: -   General appearance: alert, cooperative and no distress GI: soft, few BS, no peritionitis, no rebound.  Lab Results:  Recent Labs    03/04/18 0516 03/05/18 0454  WBC 22.5* 20.7*  HGB 8.9* 9.5*  HCT 27.8* 30.1*  PLT 266 288    BMET Recent Labs    03/04/18 0516 03/05/18 0454  NA 137 137  K 3.8 3.7  CL 102 104  CO2 27 27  GLUCOSE 150* 72  BUN 13 12  CREATININE 1.13 1.15  CALCIUM 8.1* 8.1*   PT/INR No results for input(s): LABPROT, INR in the last 72 hours.  Recent Labs  Lab 03/02/18 0926 03/03/18 0753 03/04/18 0516 03/05/18 0454  AST 40 19 15 17   ALT 70* 44 29 24  ALKPHOS 61 55 51 63  BILITOT 0.5 0.4 0.5 0.5  PROT 6.6 6.6 5.5* 5.6*  ALBUMIN 2.8* 2.6* 2.3* 2.4*     Lipase     Component Value Date/Time   LIPASE 20 03/03/2018 0753     Medications: . amLODipine  5 mg Oral Daily  . atorvastatin  80 mg Oral q morning - 10a  . carbamazepine  200 mg Oral QHS  . carvedilol  25 mg Oral BID WC  . docusate sodium  100 mg Oral BID  . enoxaparin (LOVENOX) injection  40 mg Subcutaneous Q24H  . FLUoxetine  40 mg Oral Daily  . gabapentin  300 mg Oral QHS  . gi cocktail  30 mL Oral TID  . hydrALAZINE  50 mg Oral Q8H  . insulin aspart  0-15 Units Subcutaneous TID WC   . insulin aspart  0-5 Units Subcutaneous QHS  . insulin glargine  20 Units Subcutaneous BID  . pantoprazole (PROTONIX) IV  40 mg Intravenous Q12H  . sodium chloride flush  3 mL Intravenous Q12H  . sucralfate  1 g Oral TID WC & HS  . tamsulosin  0.4 mg Oral Daily  . traZODone  100 mg Oral QHS   . cefTRIAXone (ROCEPHIN)  IV 2 g (03/04/18 1317)  . dextrose 5 % and 0.9 % NaCl with KCl 20 mEq/L 75 mL/hr at 03/04/18 2147  . 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  No change in exam  or symptoms.  Will follow, but currently no surgical indications.     LOS: 3 days    Shuayb Schepers 03/05/2018 [phone]

## 2018-03-06 ENCOUNTER — Inpatient Hospital Stay (HOSPITAL_COMMUNITY): Payer: Medicare HMO

## 2018-03-06 LAB — CBC
HEMATOCRIT: 28 % — AB (ref 39.0–52.0)
Hemoglobin: 8.7 g/dL — ABNORMAL LOW (ref 13.0–17.0)
MCH: 29.2 pg (ref 26.0–34.0)
MCHC: 31.1 g/dL (ref 30.0–36.0)
MCV: 94 fL (ref 78.0–100.0)
Platelets: 272 10*3/uL (ref 150–400)
RBC: 2.98 MIL/uL — ABNORMAL LOW (ref 4.22–5.81)
RDW: 12.8 % (ref 11.5–15.5)
WBC: 14.7 10*3/uL — AB (ref 4.0–10.5)

## 2018-03-06 LAB — MAGNESIUM: MAGNESIUM: 2 mg/dL (ref 1.7–2.4)

## 2018-03-06 LAB — COMPREHENSIVE METABOLIC PANEL
ALBUMIN: 2.1 g/dL — AB (ref 3.5–5.0)
ALK PHOS: 52 U/L (ref 38–126)
ALT: 21 U/L (ref 0–44)
AST: 17 U/L (ref 15–41)
Anion gap: 5 (ref 5–15)
BILIRUBIN TOTAL: 0.4 mg/dL (ref 0.3–1.2)
BUN: 9 mg/dL (ref 6–20)
CALCIUM: 7.9 mg/dL — AB (ref 8.9–10.3)
CO2: 29 mmol/L (ref 22–32)
Chloride: 103 mmol/L (ref 98–111)
Creatinine, Ser: 1.12 mg/dL (ref 0.61–1.24)
GFR calc Af Amer: >60 mL/min (ref >60)
Glucose, Bld: 103 mg/dL — ABNORMAL HIGH (ref 70–99)
POTASSIUM: 3.9 mmol/L (ref 3.5–5.1)
Sodium: 137 mmol/L (ref 135–145)
TOTAL PROTEIN: 5 g/dL — AB (ref 6.5–8.1)

## 2018-03-06 LAB — GLUCOSE, CAPILLARY
GLUCOSE-CAPILLARY: 131 mg/dL — AB (ref 70–99)
GLUCOSE-CAPILLARY: 106 mg/dL — AB (ref 70–99)
Glucose-Capillary: 102 mg/dL — ABNORMAL HIGH (ref 70–99)
Glucose-Capillary: 105 mg/dL — ABNORMAL HIGH (ref 70–99)
Glucose-Capillary: 106 mg/dL — ABNORMAL HIGH (ref 70–99)
Glucose-Capillary: 99 mg/dL (ref 70–99)

## 2018-03-06 MED ORDER — IOPAMIDOL (ISOVUE-300) INJECTION 61%
INTRAVENOUS | Status: AC
  Administered 2018-03-06: 09:00:00
  Filled 2018-03-06: qty 30

## 2018-03-06 MED ORDER — KCL IN DEXTROSE-NACL 20-5-0.9 MEQ/L-%-% IV SOLN
INTRAVENOUS | Status: DC
  Administered 2018-03-06: 21:00:00 via INTRAVENOUS
  Administered 2018-03-06: 800 mL via INTRAVENOUS
  Administered 2018-03-07: 06:00:00 via INTRAVENOUS
  Filled 2018-03-06 (×3): qty 1000

## 2018-03-06 MED ORDER — IOHEXOL 300 MG/ML  SOLN
100.0000 mL | Freq: Once | INTRAMUSCULAR | Status: AC | PRN
  Administered 2018-03-06: 100 mL via INTRAVENOUS

## 2018-03-06 MED ORDER — FUROSEMIDE 10 MG/ML IJ SOLN
40.0000 mg | Freq: Once | INTRAMUSCULAR | Status: AC
  Administered 2018-03-06: 40 mg via INTRAVENOUS
  Filled 2018-03-06: qty 4

## 2018-03-06 NOTE — Progress Notes (Signed)
PT Cancellation Note  Patient Details Name: Rami Budhu MRN: [id number] DOB: [birth year]   Cancelled Treatment:    Reason Eval/Treat Not Completed: Patient declined, no reason specified. Pt reports he is scheduled for CT and possible EGD at 1100. He is declining OOB at this time. He states he will transfer to recliner this afternoon. He verbalizes no concerns regarding his mobility.    Lorriane Shire 03/06/2018, 9:29 AM   Lorrin Goodell, PT  Office # [phone] Pager [phone]

## 2018-03-06 NOTE — Progress Notes (Signed)
OT Cancellation Note  Patient Details Name: Shane [name] MRN: [id number] DOB: Oct 15, 1970   Cancelled Treatment:    Reason Eval/Treat Not Completed: Patient at procedure or test/ unavailable.  As per PT, pt awaiting EGD and CT, and does not want to get up until after procedures.  Will reattempt as able.   Lucille Passy, OTR/L Acute Rehabilitation Services Pager [phone] Office [phone]  Lucille Passy M 03/06/2018, 10:57 AM

## 2018-03-06 NOTE — Progress Notes (Addendum)
          Daily Rounding Note  03/06/2018, 9:45 AM  LOS: 4 days   SUBJECTIVE:   Chief complaint:  Upper abdominal pain continues.  No BM's.  No nausea       OBJECTIVE:         Vital signs in last 24 hours:    Temp:  [98.3 F (36.8 C)-98.7 F (37.1 C)] 98.3 F (36.8 C) (09/20 0607) Pulse Rate:  [71-76] 72 (09/20 0607) Resp:  [18] 18 (09/19 2111) BP: (150-174)/(84-97) 164/97 (09/20 0607) SpO2:  [94 %-98 %] 98 % (09/20 0607) Last BM Date: 03/01/18 Filed Weights   03/03/18 0449 03/03/18 0559  Weight: 92.7 kg 93.1 kg   General: pleasant, looks well, comfortable   Heart: RRR Chest: clear bil.   Abdomen: soft, NT (just got Dilaudid).  Active BS.  ND  Extremities: no CCE.  Bandaged right foot.  bil   Ray amputations.   Neuro/Psych:  Pleasant, seems more relaxed.  Fully alert and oriented.    Lab Results: Recent Labs    03/05/18 0454 03/05/18 1550 03/06/18 0503  WBC 20.7* 15.9* 14.7*  HGB 9.5* 9.0* 8.7*  HCT 30.1* 28.9* 28.0*  PLT 288 281 272   BMET Recent Labs    03/04/18 0516 03/05/18 0454 03/06/18 0503  NA 137 137 137  K 3.8 3.7 3.9  CL 102 104 103  CO2 27 27 29   GLUCOSE 150* 72 103*  BUN 13 12 9   CREATININE 1.13 1.15 1.12  CALCIUM 8.1* 8.1* 7.9*   LFT Recent Labs    03/04/18 0516 03/05/18 0454 03/06/18 0503  PROT 5.5* 5.6* 5.0*  ALBUMIN 2.3* 2.4* 2.1*  AST 15 17 17   ALT 29 24 21   ALKPHOS 51 63 52  BILITOT 0.5 0.5 0.4     ASSESMENT:   *Gastric inflammation, edema, ulceration acutely following right partial foot amputation. Etiology unclear.  Eosinophil count normal, so unlikely eosinophilic gastritis.   Day 4 Ceftriaxone, Metronidazole. IV BID PPI, Carafate in place. NPO x meds/chips/sips. Steady decline WBCs, no fevers. looks better today but still requiring regular Dilaudid.   Appreciate Dr Brantley Stage and PA Creig Hines eval.      * Normocytic anemia.   * ASPVD.  S/p right first  ray amputation 9/13.  No issues seen on exam by Dr Sharol Given 9/18. Previous left partial foot amputation.     PLAN:     *   Repeat CT abdomen ordered.  Drinking contrast for this now.    *  Anemia studies in AM along with CBC     [name]  03/06/2018, 9:45 AM Phone [place] Attending   I have taken an interval history, reviewed the chart and examined the patient. I agree with the Advanced Practitioner's note, impression and recommendations.    CT shows markedly less gastric wall thickening  Will allow clears and see how he does  NPO in AM  Will decide on EGD then  Gatha Mayer, MD, [facility] 03/06/2018 4:33 PM Pager [phone]

## 2018-03-06 NOTE — Progress Notes (Signed)
Repeat CT today shows marked improvement of appearance of the stomach, no gastric ulcer, mass or outlet obstruction. No surgical issues.  Will sign off, call with concerns.  Wellington Hampshire, [facility] Surgery 03/06/2018, 1:42 PM Pager: [phone] Consults: [phone] Mon 7:00 am -11:30 AM Tues-Fri 7:00 am-4:30 pm Sat-Sun 7:00 am-11:30 am

## 2018-03-06 NOTE — Progress Notes (Signed)
@IPLOG @        PROGRESS NOTE                                                                                                                                                                                                             Patient Demographics:    Shane [name], is a 47 y.o. male, DOB - 1970-07-16, IWL:[id number]  Admit date - 03/02/2018   Admitting Physician Karmen Bongo, MD  Outpatient Primary MD for the patient is Charlott Rakes, MD  LOS - 4  Chief Complaint  Patient presents with  . Abdominal Pain       Brief Narrative  Shane [name] is a 47 y.o. male with medical history significant of DM; CVA; Jehovah's witness; chronic diabetic foot ulcer s/p I&D on 8/7, recently completed 1 month of doxycycline, with subsequent R foot cellulitis requiring hospitalization through 9/8 and first ray amputation 9/13; lower GI bleed; HTN; HLD; bipolar; and chronic diastolic CHF presenting with abdominal pain found to have severe gastritis on CT scan.   Subjective:   Patient in bed, appears comfortable, denies any headache, no fever, no chest pain or pressure, no shortness of breath , claims he is still having the same epigastric abdominal pain. No focal weakness.    Assessment  & Plan :     1. Severe gastritis.  Continue supportive care with bowel rest, IV PPI, Carafate, IV fluids for hydration, discussed the case with GI physician Dr. Michelene Heady, suspicion for possible ischemic insult, continue empiric antibiotics, CT abdomen pelvis along with abdominal x-ray have been noted, both general surgery and GI are following plan is to repeat another CT scan with contrast on 03/06/2018 followed by EGD if needed.   2.  Recent right diabetic foot gangrene requiring surgery by Dr. Sharol Given last week.  Continue local wound care, no weightbearing in the right foot, Dr. Sharol Given evaluated the patient on 03/04/2018 here.  3.  DM type II.  Currently on combination of Lantus and sliding scale, Lantus dose  has been adjusted to good effect continue to monitor and adjust.  Lab Results  Component Value Date   HGBA1C 7.3 (H) 02/20/2018   CBG (last 3)  Recent Labs    03/06/18 0018 03/06/18 0359 03/06/18 0823  GLUCAP 106* 105* 99    4.  Dyslipidemia.  On statin continue.  5.  Essential hypertension.  Currently on combination of Norvasc, Coreg, have added oral scheduled hydralazine for better control along with PRN hydralazine.  Monitor and adjust.  6.  BPH.  On Flomax continue.  7.  Chronic diastolic CHF.  EF 60% on echocardiogram done in February 2018.  Currently compensated.    Family Communication  :  None  Code Status :  Full  Disposition Plan  :  Tele  Consults  :  GI, general surgery, orthopedics  Procedures  :   CT - 1. Marked diffuse gastric wall edema is identified with areas of intramural ulceration in the fundus and gastric cardia. Findings compatible with gastritis. No evidence for perforation. No pneumoperitoneum identified. 2. Mild bilateral perinephric fat stranding identified without hydronephrosis, nonspecific but may be related to prior insult. 3.  Aortic Atherosclerosis (ICD10-I70.0). 4. Small left pleural effusion and left lower lobe subsegmental atelectasis  DVT Prophylaxis  :  Lovenox   Lab Results  Component Value Date   PLT 272 03/06/2018    Diet :  Diet Order            Diet NPO time specified Except for: Ice Chips, Sips with Meds  Diet effective now               Inpatient Medications Scheduled Meds: . amLODipine  5 mg Oral Daily  . atorvastatin  80 mg Oral q morning - 10a  . carbamazepine  200 mg Oral QHS  . carvedilol  25 mg Oral BID WC  . docusate sodium  100 mg Oral BID  . enoxaparin (LOVENOX) injection  40 mg Subcutaneous Q24H  . FLUoxetine  40 mg Oral Daily  . gabapentin  300 mg Oral QHS  . gi cocktail  30 mL Oral TID  . hydrALAZINE  50 mg Oral Q8H  . insulin aspart  0-15 Units Subcutaneous TID WC  . insulin aspart  0-5 Units  Subcutaneous QHS  . insulin glargine  20 Units Subcutaneous BID  . pantoprazole (PROTONIX) IV  40 mg Intravenous Q12H  . sodium chloride flush  3 mL Intravenous Q12H  . sucralfate  1 g Oral TID WC & HS  . tamsulosin  0.4 mg Oral Daily  . traZODone  100 mg Oral QHS   Continuous Infusions: . cefTRIAXone (ROCEPHIN)  IV 2 g (03/05/18 1336)  . dextrose 5 % and 0.9 % NaCl with KCl 20 mEq/L 1,000 mL (03/06/18 0803)  . metronidazole 500 mg (03/06/18 0306)   PRN Meds:.hydrALAZINE, HYDROmorphone (DILAUDID) injection, [DISCONTINUED] ondansetron **OR** ondansetron (ZOFRAN) IV  Antibiotics  :   Anti-infectives (From admission, onward)   Start     Dose/Rate Route Frequency Ordered Stop   03/03/18 1400  cefTRIAXone (ROCEPHIN) 2 g in sodium chloride 0.9 % 100 mL IVPB     2 g 200 mL/hr over 30 Minutes Intravenous Every 24 hours 03/02/18 1601     03/02/18 2000  metroNIDAZOLE (FLAGYL) IVPB 500 mg     500 mg 100 mL/hr over 60 Minutes Intravenous Every 8 hours 03/02/18 1601     03/02/18 1330  cefTRIAXone (ROCEPHIN) 2 g in sodium chloride 0.9 % 100 mL IVPB     2 g 200 mL/hr over 30 Minutes Intravenous  Once 03/02/18 1326 03/02/18 1413   03/02/18 1330  metroNIDAZOLE (FLAGYL) IVPB 500 mg     500 mg 100 mL/hr over 60 Minutes Intravenous  Once 03/02/18 1326 03/02/18 1605          Objective:   Vitals:   03/05/18 1601 03/05/18 2111 03/05/18 2355 03/06/18 0607  BP: (!) 150/86 (!) 174/93 (!) 150/84 (!) 164/97  Pulse: 71 72 73 72  Resp:  18    Temp:  98.6 F (37 C)  98.3 F (36.8 C)  TempSrc:  Oral    SpO2:  95% 94% 98%  Weight:      Height:        Wt Readings from Last 3 Encounters:  03/03/18 93.1 kg  02/28/18 90 kg  02/24/18 89.4 kg     Intake/Output Summary (Last 24 hours) at 03/06/2018 1011 Last data filed at 03/06/2018 0932 Gross per 24 hour  Intake 2740.45 ml  Output 1700 ml  Net 1040.45 ml     Physical Exam  Awake Alert, Oriented X 3, No new F.N deficits, Normal  affect [place].AT,PERRAL Supple Neck,No JVD, No cervical lymphadenopathy appriciated.  Symmetrical Chest wall movement, Good air movement bilaterally, CTAB RRR,No Gallops, Rubs or new Murmurs, No Parasternal Heave +ve B.Sounds, Abd Soft, mild epigastric tenderness, No organomegaly appriciated, No rebound - guarding or rigidity. No Cyanosis, Clubbing or edema, No new Rash or bruise    Data Review:    CBC Recent Labs  Lab 03/03/18 0753 03/04/18 0516 03/05/18 0454 03/05/18 1550 03/06/18 0503  WBC 29.9* 22.5* 20.7* 15.9* 14.7*  HGB 9.6* 8.9* 9.5* 9.0* 8.7*  HCT 29.9* 27.8* 30.1* 28.9* 28.0*  PLT 274 266 288 281 272  MCV 92.6 93.0 94.4 94.1 94.0  MCH 29.7 29.8 29.8 29.3 29.2  MCHC 32.1 32.0 31.6 31.1 31.1  RDW 13.0 13.0 12.9 12.8 12.8  LYMPHSABS  --   --   --  2.1  --   MONOABS  --   --   --  0.9  --   EOSABS  --   --   --  0.4  0.4  --   BASOSABS  --   --   --  0.1  --     Chemistries  Recent Labs  Lab 03/02/18 0926 03/03/18 0753 03/04/18 0516 03/05/18 0454 03/06/18 0503  NA 138 137 137 137 137  K 4.5 3.7 3.8 3.7 3.9  CL 102 100 102 104 103  CO2 25 28 27 27 29   GLUCOSE 329* 223* 150* 72 103*  BUN 22* 14 13 12 9   CREATININE 1.37* 1.04 1.13 1.15 1.12  CALCIUM 8.6* 8.4* 8.1* 8.1* 7.9*  MG  --  1.6* 2.2 2.1 2.0  AST 40 19 15 17 17   ALT 70* 44 29 24 21   ALKPHOS 61 55 51 63 52  BILITOT 0.5 0.4 0.5 0.5 0.4   ------------------------------------------------------------------------------------------------------------------ No results for input(s): CHOL, HDL, LDLCALC, TRIG, CHOLHDL, LDLDIRECT in the last 72 hours.  Lab Results  Component Value Date   HGBA1C 7.3 (H) 02/20/2018   ------------------------------------------------------------------------------------------------------------------ No results for input(s): TSH, T4TOTAL, T3FREE, THYROIDAB in the last 72 hours.  Invalid input(s):  FREET3 ------------------------------------------------------------------------------------------------------------------ No results for input(s): VITAMINB12, FOLATE, FERRITIN, TIBC, IRON, RETICCTPCT in the last 72 hours.  Coagulation profile No results for input(s): INR, PROTIME in the last 168 hours.  No results for input(s): DDIMER in the last 72 hours.  Cardiac Enzymes No results for input(s): CKMB, TROPONINI, MYOGLOBIN in the last 168 hours.  Invalid input(s): CK ------------------------------------------------------------------------------------------------------------------    Component Value Date/Time   BNP 22.4 02/26/2016 1650    Micro Results Recent Results (from the past 240 hour(s))  Culture, Urine     Status: None   Collection Time: 03/02/18  9:46 AM  Result Value Ref Range Status   Specimen Description URINE, RANDOM  Final   Special Requests NONE  Final   Culture  Final    NO GROWTH Performed at [facility], [place] [address], [place], [place] [address]    Report Status 03/04/2018 FINAL  Final  Culture, blood (routine x 2)     Status: None (Preliminary result)   Collection Time: 03/02/18  5:22 PM  Result Value Ref Range Status   Specimen Description BLOOD LEFT ANTECUBITAL  Final   Special Requests   Final    BOTTLES DRAWN AEROBIC ONLY Blood Culture adequate volume   Culture   Final    NO GROWTH 4 DAYS Performed at [facility], [place] [address] [place], [place] [address]    Report Status PENDING  Incomplete  Culture, blood (routine x 2)     Status: None (Preliminary result)   Collection Time: 03/02/18  5:30 PM  Result Value Ref Range Status   Specimen Description BLOOD LEFT HAND  Final   Special Requests   Final    BOTTLES DRAWN AEROBIC ONLY Blood Culture adequate volume   Culture   Final    NO GROWTH 4 DAYS Performed at [facility], [place] [address], [place], [place] [address]    Report Status PENDING  Incomplete    Radiology  Reports  Dg Chest 2 View  Result Date: 03/02/2018 CLINICAL DATA:  Weakness, sepsis EXAM: CHEST - 2 VIEW COMPARISON:  02/08/2018, 01/24/2018, CT 01/24/2018 FINDINGS: Patchy left lower lobe opacity. No pleural effusion. Stable cardiomediastinal silhouette. No pneumothorax. IMPRESSION: Low lung volumes. Patchy airspace disease in the left lower lobe may reflect atelectasis or mild pneumonia. Electronically Signed   By: Donavan Foil M.D.   On: 03/02/2018 19:39   Dg Chest 2 View  Result Date: 02/08/2018 CLINICAL DATA:  Chest pain EXAM: CHEST - 2 VIEW COMPARISON:  01/24/2018 chest radiograph. FINDINGS: Stable cardiomediastinal silhouette with normal heart size. No pneumothorax. No pleural effusion. Lungs appear clear, with no acute consolidative airspace disease and no pulmonary edema. IMPRESSION: No active cardiopulmonary disease. Electronically Signed   By: Ilona Sorrel M.D.   On: 02/08/2018 14:00   Ct Abdomen Pelvis W Contrast  Result Date: 03/02/2018 CLINICAL DATA:  Abdominal pain. EXAM: CT ABDOMEN AND PELVIS WITH CONTRAST TECHNIQUE: Multidetector CT imaging of the abdomen and pelvis was performed using the standard protocol following bolus administration of intravenous contrast. CONTRAST:  123mL OMNIPAQUE IOHEXOL 300 MG/ML  SOLN COMPARISON:  CT chest 01/24/2018. FINDINGS: Lower chest: Small left pleural effusion. Subsegmental atelectasis noted in the left lower lobe. Hepatobiliary: No focal liver abnormality is seen. No gallstones, gallbladder wall thickening, or biliary dilatation. Pancreas: Unremarkable. No pancreatic ductal dilatation or surrounding inflammatory changes. Spleen: Normal in size without focal abnormality. Adrenals/Urinary Tract: Normal adrenal glands. Bilateral perinephric fat stranding identified. No mass or hydronephrosis identified. Urinary bladder appears normal. Stomach/Bowel: The stomach is diffusely abnormal in appearance with marked diffuse gastric wall edema and Peri cast  strict fat stranding. Areas of intramural ulceration containing gas is identified along the lesser curvature of the gastric fundus and gastric cardia, image 58/6 and image 72/6. No evidence for gastric perforation identified. No significant wall thickening involving the duodenum. No abnormal small bowel wall thickening or inflammation. No pathologic dilatation of the colon. Vascular/Lymphatic: Aortic atherosclerosis. No aneurysm. No upper abdominal adenopathy identified. No pelvic or inguinal adenopathy. Reproductive: Prostate gland and seminal vesicles are unremarkable. Other: No significant free fluid or fluid collections. Musculoskeletal: No acute or significant osseous findings. IMPRESSION: 1. Marked diffuse gastric wall edema is identified with areas of intramural  ulceration in the fundus and gastric cardia. Findings compatible with gastritis. No evidence for perforation. No pneumoperitoneum identified. 2. Mild bilateral perinephric fat stranding identified without hydronephrosis, nonspecific but may be related to prior insult. 3.  Aortic Atherosclerosis (ICD10-I70.0). 4. Small left pleural effusion and left lower lobe subsegmental atelectasis. Electronically Signed   By: Kerby Moors M.D.   On: 03/02/2018 13:24   Mr Foot Right W Wo Contrast  Result Date: 02/21/2018 CLINICAL DATA:  Open wound of the right foot on the first digit. Prior surgeries. Diabetes. EXAM: MRI OF THE RIGHT FOREFOOT WITHOUT AND WITH CONTRAST TECHNIQUE: Multiplanar, multisequence MR imaging of the right forefoot was performed before and after the administration of intravenous contrast. CONTRAST:  8 cc Gadavist COMPARISON:  Multiple exams, including 01/12/2018 and radiographs of 02/18/2018 FINDINGS: Bones/Joint/Cartilage Low-grade but abnormal edema and enhancement in the phalanges of the great toe; head and shaft of the first metatarsal; and in the head and distal shaft of the second metatarsal suspicious for early osteomyelitis. The  first digit sesamoids do not appear to abnormally enhance. Trace effusion of the first MTP joint dorsally. Ligaments Today's exam focused on the distal forefoot, and the level of the Lisfranc ligament was omitted. Muscles and Tendons Abnormal edema signal and enhancement especially in the abductor hallucis muscle compatible with myositis. There is a lesser degree of abnormal muscular edema and enhancement in the interosseous muscles and remaining plantar musculature of the foot, some of which may be neurogenic. Soft tissues Deep ulceration of the medial aspect of the great toe extending to the periosteal margin of the base of the distal phalanx as shown on image 26/11. Additional ulceration medial to the first MTP joint with surrounding subcutaneous edema and inflammatory stranding compatible with cellulitis which extends into the great toe. Questionable foreign body along the dorsum of the foot on image 6/11, dorsal to the second metatarsal shaft, with some surrounding edema and enhancement. This could also represent a small locule of gas. IMPRESSION: 1. Deep ulceration of the medial great toe extending to the base of the distal phalanx periosteal margin. There is abnormal edema and enhancement in the phalanges of the great toe; in the head and shaft of the first metatarsal; and in the head and distal shaft of the second metatarsal suspicious for early osteomyelitis. 2. 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 3. Cellulitis of the great toe and medial distal foot. 4. Shallow ulceration medial to the first MTP joint. 5. Low signal intensity structure potentially a small foreign body or locule of gas in the dorsal subcutaneous tissues of the foot on image 6/6, dorsal to the level of the second metatarsal, not readily visible on conventional radiography. Electronically Signed   By: Van Clines M.D.   On: 02/21/2018 13:54   Dg Abd  Portable 1v  Result Date: 03/04/2018 CLINICAL DATA:  Central abdominal pain for several days EXAM: PORTABLE ABDOMEN - 1 VIEW COMPARISON:  None. FINDINGS: Scattered large and small bowel gas is noted. No obstructive changes are seen. No free air is noted. Vas deferens calcifications are seen. No acute bony abnormality is noted. IMPRESSION: No acute abnormality noted. Electronically Signed   By: Inez Catalina M.D.   On: 03/04/2018 08:45   Dg Foot Complete Right  Result Date: 02/18/2018 CLINICAL DATA:  Diabetic patient with a wound on the right great toe. EXAM: RIGHT FOOT COMPLETE - 3+ VIEW COMPARISON:  Plain films right foot  02/08/2018. MRI right foot 01/12/2018. FINDINGS: Skin wound is seen along the medial aspect of the first MTP joint. Soft tissues of the foot are swollen. No soft tissue gas or radiopaque foreign body. No bony destructive change or periosteal reaction. IMPRESSION: Skin ulceration soft tissue swelling without evidence of osteomyelitis. Electronically Signed   By: Inge Rise M.D.   On: 02/18/2018 11:14   Dg Foot Complete Right  Result Date: 02/08/2018 CLINICAL DATA:  Poor healing surgical wounds to right foot. History of diabetes. EXAM: RIGHT FOOT COMPLETE - 3+ VIEW COMPARISON:  None. FINDINGS: No fractures. No evidence of osteomyelitis. Vascular calcifications are noted. IMPRESSION: No evidence of osteomyelitis. Electronically Signed   By: Dorise Bullion III M.D   On: 02/08/2018 15:14    Time Spent in minutes  30   Lala Lund M.D on 03/06/2018 at 10:11 AM  To page go to [email or web address] - password [facility]

## 2018-03-06 NOTE — Progress Notes (Signed)
CC: abdominal pain  Subjective: No significant change, drinking contrast for another CT.  On exam he does not have any sign of peritonitis, still complains of pain.   Objective: Vital signs in last 24 hours: Temp:  [98.3 F (36.8 C)-98.7 F (37.1 C)] 98.3 F (36.8 C) (09/20 0607) Pulse Rate:  [71-76] 72 (09/20 0607) Resp:  [18] 18 (09/19 2111) BP: (150-174)/(84-97) 164/97 (09/20 0607) SpO2:  [94 %-98 %] 98 % (09/20 0607) Last BM Date: 03/01/18 340 PO recorded 2300 - IV 1100 urine Afebrile, VSS WBC continues to improve   Intake/Output from previous day: 09/19 0701 - 09/20 0700 In: 2684.6 [P.O.:340; I.V.:1813.3; IV Piggyback:531.3] Out: 1100 [Urine:1100] Intake/Output this shift: Total I/O In: 95.9 [I.V.:95.9] Out: -   General appearance: alert, cooperative and no distress GI: he still complains of pain, but abdomen is soft, +BS, No peritonitis, no rebound.  Lab Results:  Recent Labs    03/05/18 1550 03/06/18 0503  WBC 15.9* 14.7*  HGB 9.0* 8.7*  HCT 28.9* 28.0*  PLT 281 272    BMET Recent Labs    03/05/18 0454 03/06/18 0503  NA 137 137  K 3.7 3.9  CL 104 103  CO2 27 29  GLUCOSE 72 103*  BUN 12 9  CREATININE 1.15 1.12  CALCIUM 8.1* 7.9*   PT/INR No results for input(s): LABPROT, INR in the last 72 hours.  Recent Labs  Lab 03/02/18 0926 03/03/18 0753 03/04/18 0516 03/05/18 0454 03/06/18 0503  AST 40 19 15 17 17   ALT 70* 44 29 24 21   ALKPHOS 61 55 51 63 52  BILITOT 0.5 0.4 0.5 0.5 0.4  PROT 6.6 6.6 5.5* 5.6* 5.0*  ALBUMIN 2.8* 2.6* 2.3* 2.4* 2.1*     Lipase     Component Value Date/Time   LIPASE 20 03/03/2018 0753     Medications: . iopamidol      . amLODipine  5 mg Oral Daily  . atorvastatin  80 mg Oral q morning - 10a  . carbamazepine  200 mg Oral QHS  . carvedilol  25 mg Oral BID WC  . docusate sodium  100 mg Oral BID  . enoxaparin (LOVENOX) injection  40 mg Subcutaneous Q24H  . FLUoxetine  40 mg Oral Daily  .  gabapentin  300 mg Oral QHS  . gi cocktail  30 mL Oral TID  . hydrALAZINE  50 mg Oral Q8H  . insulin aspart  0-15 Units Subcutaneous TID WC  . insulin aspart  0-5 Units Subcutaneous QHS  . insulin glargine  20 Units Subcutaneous BID  . pantoprazole (PROTONIX) IV  40 mg Intravenous Q12H  . sodium chloride flush  3 mL Intravenous Q12H  . sucralfate  1 g Oral TID WC & HS  . tamsulosin  0.4 mg Oral Daily  . traZODone  100 mg Oral QHS   Anti-infectives (From admission, onward)   Start     Dose/Rate Route Frequency Ordered Stop   03/03/18 1400  cefTRIAXone (ROCEPHIN) 2 g in sodium chloride 0.9 % 100 mL IVPB     2 g 200 mL/hr over 30 Minutes Intravenous Every 24 hours 03/02/18 1601     03/02/18 2000  metroNIDAZOLE (FLAGYL) IVPB 500 mg     500 mg 100 mL/hr over 60 Minutes Intravenous Every 8 hours 03/02/18 1601     03/02/18 1330  cefTRIAXone (ROCEPHIN) 2 g in sodium chloride 0.9 % 100 mL IVPB     2 g 200 mL/hr over  30 Minutes Intravenous  Once 03/02/18 1326 03/02/18 1413   03/02/18 1330  metroNIDAZOLE (FLAGYL) IVPB 500 mg     500 mg 100 mL/hr over 60 Minutes Intravenous  Once 03/02/18 1326 03/02/18 1605      Assessment/Plan Type II diabetes Hypertension Stage III CKD Hx of Diastolic CHF - grade 1 dysfunction Hx of TIA - says he still has issues with concentration Hyperlipidemia 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  CT pending, WBC improving.      LOS: 4 days    Darrelyn Morro 03/06/2018 [phone]

## 2018-03-07 ENCOUNTER — Encounter (HOSPITAL_COMMUNITY)
Admission: EM | Disposition: A | Discharge status: Home / Self Care | Payer: Self-pay | Source: Home / Self Care | Attending: Internal Medicine

## 2018-03-07 ENCOUNTER — Encounter (HOSPITAL_COMMUNITY): Payer: Self-pay | Admitting: *Deleted

## 2018-03-07 DIAGNOSIS — K559 Vascular disorder of intestine, unspecified: Secondary | ICD-10-CM | POA: Diagnosis present

## 2018-03-07 DIAGNOSIS — K259 Gastric ulcer, unspecified as acute or chronic, without hemorrhage or perforation: Secondary | ICD-10-CM

## 2018-03-07 DIAGNOSIS — K2970 Gastritis, unspecified, without bleeding: Secondary | ICD-10-CM

## 2018-03-07 DIAGNOSIS — K3189 Other diseases of stomach and duodenum: Secondary | ICD-10-CM

## 2018-03-07 HISTORY — PX: BIOPSY: SHX5522

## 2018-03-07 HISTORY — PX: ESOPHAGOGASTRODUODENOSCOPY: SHX5428

## 2018-03-07 LAB — GLUCOSE, CAPILLARY
GLUCOSE-CAPILLARY: 85 mg/dL (ref 70–99)
GLUCOSE-CAPILLARY: 83 mg/dL (ref 70–99)
GLUCOSE-CAPILLARY: 78 mg/dL (ref 70–99)
GLUCOSE-CAPILLARY: 89 mg/dL (ref 70–99)
GLUCOSE-CAPILLARY: 160 mg/dL — AB (ref 70–99)
Glucose-Capillary: 158 mg/dL — ABNORMAL HIGH (ref 70–99)
Glucose-Capillary: 182 mg/dL — ABNORMAL HIGH (ref 70–99)

## 2018-03-07 LAB — CULTURE, BLOOD (ROUTINE X 2)
CULTURE: NO GROWTH
Culture: NO GROWTH
Special Requests: ADEQUATE
Special Requests: ADEQUATE

## 2018-03-07 LAB — VITAMIN B12: VITAMIN B 12: 316 pg/mL (ref 180–914)

## 2018-03-07 LAB — COMPREHENSIVE METABOLIC PANEL
ALK PHOS: 48 U/L (ref 38–126)
ALT: 24 U/L (ref 0–44)
AST: 26 U/L (ref 15–41)
Albumin: 2.1 g/dL — ABNORMAL LOW (ref 3.5–5.0)
Anion gap: 6 (ref 5–15)
BILIRUBIN TOTAL: 0.5 mg/dL (ref 0.3–1.2)
BUN: 8 mg/dL (ref 6–20)
CALCIUM: 7.9 mg/dL — AB (ref 8.9–10.3)
CO2: 29 mmol/L (ref 22–32)
Chloride: 102 mmol/L (ref 98–111)
Creatinine, Ser: 1.11 mg/dL (ref 0.61–1.24)
GFR calc Af Amer: >60 mL/min (ref >60)
Glucose, Bld: 84 mg/dL (ref 70–99)
POTASSIUM: 3.7 mmol/L (ref 3.5–5.1)
Sodium: 137 mmol/L (ref 135–145)
TOTAL PROTEIN: 5 g/dL — AB (ref 6.5–8.1)

## 2018-03-07 LAB — FOLATE: FOLATE: 14.5 ng/mL (ref >5.9)

## 2018-03-07 LAB — RETICULOCYTES
RBC.: 3.02 MIL/uL — AB (ref 4.22–5.81)
RETIC COUNT ABSOLUTE: 54.4 10*3/uL (ref 19.0–186.0)
Retic Ct Pct: 1.8 % (ref 0.4–3.1)

## 2018-03-07 LAB — CBC
HEMATOCRIT: 28.2 % — AB (ref 39.0–52.0)
Hemoglobin: 9 g/dL — ABNORMAL LOW (ref 13.0–17.0)
MCH: 29.8 pg (ref 26.0–34.0)
MCHC: 31.9 g/dL (ref 30.0–36.0)
MCV: 93.4 fL (ref 78.0–100.0)
Platelets: 296 10*3/uL (ref 150–400)
RBC: 3.02 MIL/uL — ABNORMAL LOW (ref 4.22–5.81)
RDW: 12.9 % (ref 11.5–15.5)
WBC: 12.5 10*3/uL — AB (ref 4.0–10.5)

## 2018-03-07 LAB — IRON AND TIBC
IRON: 48 ug/dL (ref 45–182)
Saturation Ratios: 27 % (ref 17.9–39.5)
TIBC: 175 ug/dL — ABNORMAL LOW (ref 250–450)
UIBC: 127 ug/dL

## 2018-03-07 LAB — FERRITIN: Ferritin: 96 ng/mL (ref 24–336)

## 2018-03-07 SURGERY — EGD (ESOPHAGOGASTRODUODENOSCOPY)
Anesthesia: Moderate Sedation

## 2018-03-07 MED ORDER — BUTAMBEN-TETRACAINE-BENZOCAINE 2-2-14 % EX AERO
INHALATION_SPRAY | CUTANEOUS | Status: DC | PRN
  Administered 2018-03-07: 2 via TOPICAL

## 2018-03-07 MED ORDER — MIDAZOLAM HCL 10 MG/2ML IJ SOLN
INTRAMUSCULAR | Status: DC | PRN
  Administered 2018-03-07: 2 mg via INTRAVENOUS

## 2018-03-07 MED ORDER — FENTANYL CITRATE (PF) 100 MCG/2ML IJ SOLN
INTRAMUSCULAR | Status: AC
  Filled 2018-03-07: qty 4

## 2018-03-07 MED ORDER — DIPHENHYDRAMINE HCL 50 MG/ML IJ SOLN
INTRAMUSCULAR | Status: AC
  Filled 2018-03-07: qty 1

## 2018-03-07 MED ORDER — SODIUM CHLORIDE 0.9 % IV SOLN: INTRAVENOUS | Status: DC

## 2018-03-07 MED ORDER — FENTANYL CITRATE (PF) 100 MCG/2ML IJ SOLN
INTRAMUSCULAR | Status: DC | PRN
  Administered 2018-03-07: 25 ug via INTRAVENOUS

## 2018-03-07 MED ORDER — HYOSCYAMINE SULFATE 0.125 MG SL SUBL
0.2500 mg | SUBLINGUAL_TABLET | Freq: Three times a day (TID) | SUBLINGUAL | Status: DC
  Administered 2018-03-07 – 2018-03-08 (×4): 0.25 mg via SUBLINGUAL
  Filled 2018-03-07 (×6): qty 2

## 2018-03-07 MED ORDER — MIDAZOLAM HCL 5 MG/ML IJ SOLN
INTRAMUSCULAR | Status: AC
  Filled 2018-03-07: qty 3

## 2018-03-07 MED ORDER — KCL IN DEXTROSE-NACL 20-5-0.9 MEQ/L-%-% IV SOLN
INTRAVENOUS | Status: DC
  Administered 2018-03-07 – 2018-03-08 (×2): via INTRAVENOUS
  Filled 2018-03-07: qty 1000

## 2018-03-07 NOTE — Progress Notes (Signed)
OT Cancellation Note  Patient Details Name: Shane [name] MRN: [id number] DOB: December 08, 1970   Cancelled Treatment:    Reason Eval/Treat Not Completed: Patient at procedure or test/ unavailable (EGD); will follow up for OT eval as schedule permits.   Lou Cal, OT [facility] Pager [phone] Office [phone]   Raymondo Band 03/07/2018, 1:52 PM

## 2018-03-07 NOTE — Interval H&P Note (Signed)
History and Physical Interval Note:  03/07/2018 1:38 PM  Shane [name]  has presented today for surgery, with the diagnosis of abnormal stomach on CT, epigastric pain  The various methods of treatment have been discussed with the patient and family. After consideration of risks, benefits and other options for treatment, the patient has consented to  Procedure(s): ESOPHAGOGASTRODUODENOSCOPY (EGD) (N/A) as a surgical intervention .  The patient's history has been reviewed, patient examined, no change in status, stable for surgery.  I have reviewed the patient's chart and labs.  Questions were answered to the patient's satisfaction.     Silvano Rusk

## 2018-03-07 NOTE — Progress Notes (Signed)
@IPLOG @        PROGRESS NOTE                                                                                                                                                                                                             Patient Demographics:    Shane [name], is a 47 y.o. male, DOB - 1970/10/25, TIR:[id number]  Admit date - 03/02/2018   Admitting Physician Karmen Bongo, MD  Outpatient Primary MD for the patient is Charlott Rakes, MD  LOS - 5  Chief Complaint  Patient presents with  . Abdominal Pain       Brief Narrative  Shane [name] is a 47 y.o. male with medical history significant of DM; CVA; Jehovah's witness; chronic diabetic foot ulcer s/p I&D on 8/7, recently completed 1 month of doxycycline, with subsequent R foot cellulitis requiring hospitalization through 9/8 and first ray amputation 9/13; lower GI bleed; HTN; HLD; bipolar; and chronic diastolic CHF presenting with abdominal pain found to have severe gastritis on CT scan.   Subjective:   Patient in bed, appears comfortable, denies any headache, no fever, no chest pain or pressure, no shortness of breath , some nausea vomiting this morning, continues to have epigastric abdominal pain. No focal weakness.   Assessment  & Plan :     1. Severe gastritis.  Continue supportive care with bowel rest, IV PPI, Carafate, IV fluids for hydration, discussed the case with GI physician Dr. Michelene Heady, suspicion for possible ischemic insult, continue empiric antibiotics, CT abdomen pelvis along with abdominal x-ray have been noted, repeat CT shows improvement in gastritis-like pattern however symptoms continue hence likely EGD on 03/07/2018 Case discussed with GI again, also seen by general surgery this admission.   2.  Recent right diabetic foot gangrene requiring surgery by Dr. Sharol Given last week.  Continue local wound care, no weightbearing in the right foot, Dr. Sharol Given evaluated the patient on 03/04/2018 here.  3.  DM type  II.  Currently on combination of Lantus and sliding scale, Lantus dose has been adjusted to good effect continue to monitor and adjust.  Lab Results  Component Value Date   HGBA1C 7.3 (H) 02/20/2018   CBG (last 3)  Recent Labs    03/07/18 0129 03/07/18 0424 03/07/18 0748  GLUCAP 85 83 78    4.  Dyslipidemia.  On statin continue.  5.  Essential hypertension.  Currently on combination of Norvasc, Coreg, have added oral scheduled hydralazine for better control along with PRN hydralazine.  Monitor and  adjust.  6.  BPH.  On Flomax continue.  7.  Chronic diastolic CHF.  EF 60% on echocardiogram done in February 2018.  Currently compensated.    Family Communication  :  None  Code Status :  Full  Disposition Plan  :  Tele  Consults  :  GI, general surgery, orthopedics  Procedures  :   CT - 1. Marked diffuse gastric wall edema is identified with areas of intramural ulceration in the fundus and gastric cardia. Findings compatible with gastritis. No evidence for perforation. No pneumoperitoneum identified. 2. Mild bilateral perinephric fat stranding identified without hydronephrosis, nonspecific but may be related to prior insult. 3.  Aortic Atherosclerosis (ICD10-I70.0). 4. Small left pleural effusion and left lower lobe subsegmental atelectasis  DVT Prophylaxis  :  Lovenox   Lab Results  Component Value Date   PLT 296 03/07/2018    Diet :  Diet Order            Diet NPO time specified Except for: Sips with Meds  Diet effective now               Inpatient Medications Scheduled Meds: . amLODipine  5 mg Oral Daily  . atorvastatin  80 mg Oral q morning - 10a  . carbamazepine  200 mg Oral QHS  . carvedilol  25 mg Oral BID WC  . docusate sodium  100 mg Oral BID  . enoxaparin (LOVENOX) injection  40 mg Subcutaneous Q24H  . FLUoxetine  40 mg Oral Daily  . gabapentin  300 mg Oral QHS  . gi cocktail  30 mL Oral TID  . hydrALAZINE  50 mg Oral Q8H  . insulin aspart  0-15  Units Subcutaneous TID WC  . insulin aspart  0-5 Units Subcutaneous QHS  . insulin glargine  20 Units Subcutaneous BID  . pantoprazole (PROTONIX) IV  40 mg Intravenous Q12H  . sodium chloride flush  3 mL Intravenous Q12H  . sucralfate  1 g Oral TID WC & HS  . tamsulosin  0.4 mg Oral Daily  . traZODone  100 mg Oral QHS   Continuous Infusions: . cefTRIAXone (ROCEPHIN)  IV 2 g (03/06/18 1402)  . dextrose 5 % and 0.9 % NaCl with KCl 20 mEq/L 75 mL/hr at 03/07/18 0605  . metronidazole 500 mg (03/07/18 0444)   PRN Meds:.hydrALAZINE, HYDROmorphone (DILAUDID) injection, [DISCONTINUED] ondansetron **OR** ondansetron (ZOFRAN) IV  Antibiotics  :   Anti-infectives (From admission, onward)   Start     Dose/Rate Route Frequency Ordered Stop   03/03/18 1400  cefTRIAXone (ROCEPHIN) 2 g in sodium chloride 0.9 % 100 mL IVPB     2 g 200 mL/hr over 30 Minutes Intravenous Every 24 hours 03/02/18 1601     03/02/18 2000  metroNIDAZOLE (FLAGYL) IVPB 500 mg     500 mg 100 mL/hr over 60 Minutes Intravenous Every 8 hours 03/02/18 1601     03/02/18 1330  cefTRIAXone (ROCEPHIN) 2 g in sodium chloride 0.9 % 100 mL IVPB     2 g 200 mL/hr over 30 Minutes Intravenous  Once 03/02/18 1326 03/02/18 1413   03/02/18 1330  metroNIDAZOLE (FLAGYL) IVPB 500 mg     500 mg 100 mL/hr over 60 Minutes Intravenous  Once 03/02/18 1326 03/02/18 1605          Objective:   Vitals:   03/07/18 0905 03/07/18 0942 03/07/18 0953 03/07/18 1050  BP: (!) 172/96 (!) 180/100  (!) 153/84  Pulse: 81 81  85  Resp:      Temp:      TempSrc:      SpO2: 92% 93% 92%   Weight:      Height:        Wt Readings from Last 3 Encounters:  03/03/18 93.1 kg  02/28/18 90 kg  02/24/18 89.4 kg     Intake/Output Summary (Last 24 hours) at 03/07/2018 1152 Last data filed at 03/07/2018 1002 Gross per 24 hour  Intake 805.73 ml  Output 3950 ml  Net -3144.27 ml     Physical Exam  Awake Alert, Oriented X 3, No new F.N deficits, Normal  affect [place].AT,PERRAL Supple Neck,No JVD, No cervical lymphadenopathy appriciated.  Symmetrical Chest wall movement, Good air movement bilaterally, CTAB RRR,No Gallops, Rubs or new Murmurs, No Parasternal Heave +ve B.Sounds, Abd Soft, minimal epigastric tenderness, No organomegaly appriciated, No rebound - guarding or rigidity. No Cyanosis, Clubbing or edema, No new Rash or bruise     Data Review:    CBC Recent Labs  Lab 03/04/18 0516 03/05/18 0454 03/05/18 1550 03/06/18 0503 03/07/18 0532  WBC 22.5* 20.7* 15.9* 14.7* 12.5*  HGB 8.9* 9.5* 9.0* 8.7* 9.0*  HCT 27.8* 30.1* 28.9* 28.0* 28.2*  PLT 266 288 281 272 296  MCV 93.0 94.4 94.1 94.0 93.4  MCH 29.8 29.8 29.3 29.2 29.8  MCHC 32.0 31.6 31.1 31.1 31.9  RDW 13.0 12.9 12.8 12.8 12.9  LYMPHSABS  --   --  2.1  --   --   MONOABS  --   --  0.9  --   --   EOSABS  --   --  0.4  0.4  --   --   BASOSABS  --   --  0.1  --   --     Chemistries  Recent Labs  Lab 03/03/18 0753 03/04/18 0516 03/05/18 0454 03/06/18 0503 03/07/18 0532  NA 137 137 137 137 137  K 3.7 3.8 3.7 3.9 3.7  CL 100 102 104 103 102  CO2 28 27 27 29 29   GLUCOSE 223* 150* 72 103* 84  BUN 14 13 12 9 8   CREATININE 1.04 1.13 1.15 1.12 1.11  CALCIUM 8.4* 8.1* 8.1* 7.9* 7.9*  MG 1.6* 2.2 2.1 2.0  --   AST 19 15 17 17 26   ALT 44 29 24 21 24   ALKPHOS 55 51 63 52 48  BILITOT 0.4 0.5 0.5 0.4 0.5   ------------------------------------------------------------------------------------------------------------------ No results for input(s): CHOL, HDL, LDLCALC, TRIG, CHOLHDL, LDLDIRECT in the last 72 hours.  Lab Results  Component Value Date   HGBA1C 7.3 (H) 02/20/2018   ------------------------------------------------------------------------------------------------------------------ No results for input(s): TSH, T4TOTAL, T3FREE, THYROIDAB in the last 72 hours.  Invalid input(s):  FREET3 ------------------------------------------------------------------------------------------------------------------ Recent Labs    03/07/18 0532  VITAMINB12 316  FOLATE 14.5  FERRITIN 96  TIBC 175*  IRON 48  RETICCTPCT 1.8    Coagulation profile No results for input(s): INR, PROTIME in the last 168 hours.  No results for input(s): DDIMER in the last 72 hours.  Cardiac Enzymes No results for input(s): CKMB, TROPONINI, MYOGLOBIN in the last 168 hours.  Invalid input(s): CK ------------------------------------------------------------------------------------------------------------------    Component Value Date/Time   BNP 22.4 02/26/2016 1650    Micro Results Recent Results (from the past 240 hour(s))  Culture, Urine     Status: None   Collection Time: 03/02/18  9:46 AM  Result Value Ref Range Status   Specimen Description URINE, RANDOM  Final  Special Requests NONE  Final   Culture   Final    NO GROWTH Performed at [facility], [place] [address], [place], [place] [address]    Report Status 03/04/2018 FINAL  Final  Culture, blood (routine x 2)     Status: None   Collection Time: 03/02/18  5:22 PM  Result Value Ref Range Status   Specimen Description BLOOD LEFT ANTECUBITAL  Final   Special Requests   Final    BOTTLES DRAWN AEROBIC ONLY Blood Culture adequate volume   Culture   Final    NO GROWTH 5 DAYS Performed at [facility], [place] [address] [place], [place] [address]    Report Status 03/07/2018 FINAL  Final  Culture, blood (routine x 2)     Status: None   Collection Time: 03/02/18  5:30 PM  Result Value Ref Range Status   Specimen Description BLOOD LEFT HAND  Final   Special Requests   Final    BOTTLES DRAWN AEROBIC ONLY Blood Culture adequate volume   Culture   Final    NO GROWTH 5 DAYS Performed at [facility], [place] [address] [place], [place] [address]    Report Status 03/07/2018 FINAL  Final    Radiology Reports  Dg Chest 2  View  Result Date: 03/02/2018 CLINICAL DATA:  Weakness, sepsis EXAM: CHEST - 2 VIEW COMPARISON:  02/08/2018, 01/24/2018, CT 01/24/2018 FINDINGS: Patchy left lower lobe opacity. No pleural effusion. Stable cardiomediastinal silhouette. No pneumothorax. IMPRESSION: Low lung volumes. Patchy airspace disease in the left lower lobe may reflect atelectasis or mild pneumonia. Electronically Signed   By: Donavan Foil M.D.   On: 03/02/2018 19:39   Dg Chest 2 View  Result Date: 02/08/2018 CLINICAL DATA:  Chest pain EXAM: CHEST - 2 VIEW COMPARISON:  01/24/2018 chest radiograph. FINDINGS: Stable cardiomediastinal silhouette with normal heart size. No pneumothorax. No pleural effusion. Lungs appear clear, with no acute consolidative airspace disease and no pulmonary edema. IMPRESSION: No active cardiopulmonary disease. Electronically Signed   By: Ilona Sorrel M.D.   On: 02/08/2018 14:00   Ct Abdomen W Contrast  Result Date: 03/06/2018 CLINICAL DATA:  FOLLOWUP GASTRIC WALL THICKENING. EXAM: CT ABDOMEN WITH CONTRAST TECHNIQUE: Multidetector CT imaging of the abdomen was performed using the standard protocol following bolus administration of intravenous contrast. CONTRAST:  163mL OMNIPAQUE IOHEXOL 300 MG/ML  SOLN COMPARISON:  CT scan 03/02/2018 FINDINGS: Lower chest: Small bilateral pleural effusions with overlying atelectasis, left greater than right. The heart is normal in size. No pericardial effusion. The distal esophagus is grossly normal. Hepatobiliary: No focal hepatic lesions or intrahepatic biliary dilatation. The gallbladder is normal. No common bile duct dilatation. Pancreas: No mass, inflammation or ductal dilatation. Spleen: Normal size.  No focal lesions. Adrenals/Urinary Tract: The adrenal glands and kidneys are unremarkable. No renal lesions or hydronephrosis. Stomach/Bowel: Much improved CT appearance of the stomach. The marked gastric wall thickening is vastly improved. There is some residual mild wall  thickening and some surrounding interstitial edema but no gastric ulcer or gastric outlet obstruction. The duodenum, small bowel and colon are unremarkable. No acute inflammatory changes, mass lesions or obstructive findings. Vascular/Lymphatic: The aorta and branch vessels are patent. The major venous structures are patent. No mesenteric or retroperitoneal mass or adenopathy. Small scattered lymph nodes are stable. Other: No ascites or abdominal wall hernia. Mild subcutaneous edema is noted. Musculoskeletal: No significant bony findings. IMPRESSION: 1. Marked improved CT appearance of the stomach when compared  to the prior study from 03/02/2018. No gastric ulcer, mass or outlet obstruction. 2. Small bilateral pleural effusions with overlying atelectasis, left greater than right. 3. No free air or evidence of oral contrast extravasation. Electronically Signed   By: Marijo Sanes M.D.   On: 03/06/2018 12:57   Ct Abdomen Pelvis W Contrast  Result Date: 03/02/2018 CLINICAL DATA:  Abdominal pain. EXAM: CT ABDOMEN AND PELVIS WITH CONTRAST TECHNIQUE: Multidetector CT imaging of the abdomen and pelvis was performed using the standard protocol following bolus administration of intravenous contrast. CONTRAST:  179mL OMNIPAQUE IOHEXOL 300 MG/ML  SOLN COMPARISON:  CT chest 01/24/2018. FINDINGS: Lower chest: Small left pleural effusion. Subsegmental atelectasis noted in the left lower lobe. Hepatobiliary: No focal liver abnormality is seen. No gallstones, gallbladder wall thickening, or biliary dilatation. Pancreas: Unremarkable. No pancreatic ductal dilatation or surrounding inflammatory changes. Spleen: Normal in size without focal abnormality. Adrenals/Urinary Tract: Normal adrenal glands. Bilateral perinephric fat stranding identified. No mass or hydronephrosis identified. Urinary bladder appears normal. Stomach/Bowel: The stomach is diffusely abnormal in appearance with marked diffuse gastric wall edema and Peri cast  strict fat stranding. Areas of intramural ulceration containing gas is identified along the lesser curvature of the gastric fundus and gastric cardia, image 58/6 and image 72/6. No evidence for gastric perforation identified. No significant wall thickening involving the duodenum. No abnormal small bowel wall thickening or inflammation. No pathologic dilatation of the colon. Vascular/Lymphatic: Aortic atherosclerosis. No aneurysm. No upper abdominal adenopathy identified. No pelvic or inguinal adenopathy. Reproductive: Prostate gland and seminal vesicles are unremarkable. Other: No significant free fluid or fluid collections. Musculoskeletal: No acute or significant osseous findings. IMPRESSION: 1. Marked diffuse gastric wall edema is identified with areas of intramural ulceration in the fundus and gastric cardia. Findings compatible with gastritis. No evidence for perforation. No pneumoperitoneum identified. 2. Mild bilateral perinephric fat stranding identified without hydronephrosis, nonspecific but may be related to prior insult. 3.  Aortic Atherosclerosis (ICD10-I70.0). 4. Small left pleural effusion and left lower lobe subsegmental atelectasis. Electronically Signed   By: Kerby Moors M.D.   On: 03/02/2018 13:24   Mr Foot Right W Wo Contrast  Result Date: 02/21/2018 CLINICAL DATA:  Open wound of the right foot on the first digit. Prior surgeries. Diabetes. EXAM: MRI OF THE RIGHT FOREFOOT WITHOUT AND WITH CONTRAST TECHNIQUE: Multiplanar, multisequence MR imaging of the right forefoot was performed before and after the administration of intravenous contrast. CONTRAST:  8 cc Gadavist COMPARISON:  Multiple exams, including 01/12/2018 and radiographs of 02/18/2018 FINDINGS: Bones/Joint/Cartilage Low-grade but abnormal edema and enhancement in the phalanges of the great toe; head and shaft of the first metatarsal; and in the head and distal shaft of the second metatarsal suspicious for early osteomyelitis. The  first digit sesamoids do not appear to abnormally enhance. Trace effusion of the first MTP joint dorsally. Ligaments Today's exam focused on the distal forefoot, and the level of the Lisfranc ligament was omitted. Muscles and Tendons Abnormal edema signal and enhancement especially in the abductor hallucis muscle compatible with myositis. There is a lesser degree of abnormal muscular edema and enhancement in the interosseous muscles and remaining plantar musculature of the foot, some of which may be neurogenic. Soft tissues Deep ulceration of the medial aspect of the great toe extending to the periosteal margin of the base of the distal phalanx as shown on image 26/11. Additional ulceration medial to the first MTP joint with surrounding subcutaneous edema and inflammatory stranding compatible with cellulitis which extends  into the great toe. Questionable foreign body along the dorsum of the foot on image 6/11, dorsal to the second metatarsal shaft, with some surrounding edema and enhancement. This could also represent a small locule of gas. IMPRESSION: 1. Deep ulceration of the medial great toe extending to the base of the distal phalanx periosteal margin. There is abnormal edema and enhancement in the phalanges of the great toe; in the head and shaft of the first metatarsal; and in the head and distal shaft of the second metatarsal suspicious for early osteomyelitis. 2. 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 3. Cellulitis of the great toe and medial distal foot. 4. Shallow ulceration medial to the first MTP joint. 5. Low signal intensity structure potentially a small foreign body or locule of gas in the dorsal subcutaneous tissues of the foot on image 6/6, dorsal to the level of the second metatarsal, not readily visible on conventional radiography. Electronically Signed   By: Van Clines M.D.   On: 02/21/2018 13:54   Dg Abd  Portable 1v  Result Date: 03/04/2018 CLINICAL DATA:  Central abdominal pain for several days EXAM: PORTABLE ABDOMEN - 1 VIEW COMPARISON:  None. FINDINGS: Scattered large and small bowel gas is noted. No obstructive changes are seen. No free air is noted. Vas deferens calcifications are seen. No acute bony abnormality is noted. IMPRESSION: No acute abnormality noted. Electronically Signed   By: Inez Catalina M.D.   On: 03/04/2018 08:45   Dg Foot Complete Right  Result Date: 02/18/2018 CLINICAL DATA:  Diabetic patient with a wound on the right great toe. EXAM: RIGHT FOOT COMPLETE - 3+ VIEW COMPARISON:  Plain films right foot 02/08/2018. MRI right foot 01/12/2018. FINDINGS: Skin wound is seen along the medial aspect of the first MTP joint. Soft tissues of the foot are swollen. No soft tissue gas or radiopaque foreign body. No bony destructive change or periosteal reaction. IMPRESSION: Skin ulceration soft tissue swelling without evidence of osteomyelitis. Electronically Signed   By: Inge Rise M.D.   On: 02/18/2018 11:14   Dg Foot Complete Right  Result Date: 02/08/2018 CLINICAL DATA:  Poor healing surgical wounds to right foot. History of diabetes. EXAM: RIGHT FOOT COMPLETE - 3+ VIEW COMPARISON:  None. FINDINGS: No fractures. No evidence of osteomyelitis. Vascular calcifications are noted. IMPRESSION: No evidence of osteomyelitis. Electronically Signed   By: Dorise Bullion III M.D   On: 02/08/2018 15:14    Time Spent in minutes  30   Lala Lund M.D on 03/07/2018 at 11:52 AM  To page go to [email or web address] - password [email or web address]

## 2018-03-07 NOTE — H&P (View-Only) (Signed)
   Patient Name: Shane [name] Date of Encounter: 03/07/2018, 10:53 AM    Subjective  Epigastric pain, nausea and vomiting when tried liquids yesterday   Objective  BP (!) 153/84   Pulse 85   Temp 97.9 F (36.6 C) (Oral)   Resp 18   Ht 5\' 7"  (1.702 m)   Wt 93.1 kg   SpO2 93%   BMI 32.14 kg/m  NAD  CBC Latest Ref Rng & Units 03/07/2018 03/06/2018 03/05/2018  WBC 4.0 - 10.5 K/uL 12.5(H) 14.7(H) 15.9(H)  Hemoglobin 13.0 - 17.0 g/dL 9.0(L) 8.7(L) 9.0(L)  Hematocrit 39.0 - 52.0 % 28.2(L) 28.0(L) 28.9(L)  Platelets 150 - 400 K/uL 296 272 281       Assessment and Plan  Gastritis - radiologically better - did not tolerate liquids  EGD today The risks and benefits as well as alternatives of endoscopic procedure(s) have been discussed and reviewed. All questions answered. The patient agrees to proceed.   Gatha Mayer, MD, [facility] 03/07/2018 10:53 AM Pager [phone]

## 2018-03-07 NOTE — Progress Notes (Signed)
   03/07/18 0953  What Happened  Was fall witnessed? No  Was patient injured? No  Patient found on floor  Found by Staff-comment (PT)  Stated prior activity bathroom-unassisted  Follow Up  MD notified Ronnie Derby  Time MD notified 0930  Family notified No- patient refusal  Additional tests No (orders to monitor pt)  Simple treatment Dressing  Progress note created (see row info) Yes  Adult Fall Risk Assessment  Risk Factor Category (scoring not indicated) Fall has occurred during this admission (document High fall risk)  Patient Fall Risk Level High fall risk  Adult Fall Risk Interventions  Required Bundle Interventions *See Row Information* High fall risk - low, moderate, and high requirements implemented  Additional Interventions Use of appropriate toileting equipment (bedpan, BSC, etc.);PT/OT need assessed if change in mobility from baseline  Screening for Fall Injury Risk (To be completed on HIGH fall risk patients) - Assessing Need for Low Bed  Risk For Fall Injury- Low Bed Criteria Previous fall this admission  Will Implement Low Bed and Floor Mats Yes  Screening for Fall Injury Risk (To be completed on HIGH fall risk patients who do not meet crieteria for Low Bed) - Assessing Need for Floor Mats Only  Risk For Fall Injury- Criteria for Floor Mats Noncompliant with safety precautions  Will Implement Floor Mats Yes  Oxygen Therapy  SpO2 92 %  O2 Device Room Air  Pain Assessment  Pain Scale 0-10  Pain Score 10  Pain Type Acute pain  Pain Location Abdomen  Pain Descriptors / Indicators Cramping  Pain Frequency Constant  Pain Onset On-going  Pain Intervention(s) Medication (See eMAR)  Multiple Pain Sites Yes  2nd Pain Site  Pain Score 10  Pain Type Surgical pain;Acute pain  Pain Location Foot  Pain Orientation Right  Pain Descriptors / Indicators Aching  Pain Frequency Intermittent  Pain Onset Gradual  Pain Intervention(s) Medication (See eMAR)  Neurological  Neuro  (WDL) WDL  Level of Consciousness Alert  Orientation Level Oriented X4  R Hand Grip Strong  L Hand Grip Strong  R Foot Dorsiflexion Weak  L Foot Dorsiflexion Weak  R Foot Plantar Flexion Moderate  L Foot Plantar Flexion Moderate  RUE Motor Response Purposeful movement  LUE Motor Response Purposeful movement  RLE Motor Response Purposeful movement  LLE Motor Response Purposeful movement  Glasgow Coma Scale  Eye Opening 4  Best Verbal Response (NON-intubated) 5  Best Motor Response 6  Glasgow Coma Scale Score 15

## 2018-03-07 NOTE — Progress Notes (Signed)
Physical Therapy Treatment Patient Details Name: Shane [name] MRN: [id number] DOB: [birth year] Today's Date: 03/07/2018    History of Present Illness Patient is a 47 y/o male presenting to the ED on 03/02/18 with primary complaints of abdominal pain. Of note, recent hospitalization for R first ray amputation. CT with marked gastritis with intramural ulceration. PMH significant for DM; CVA; Jehovah's witness; chronic diabetic foot ulcer s/p I&D on 8/7, recently completed 1 month of doxycycline, with subsequent R foot cellulitis requiring hospitalization through 9/8 and first ray amputation 9/13; lower GI bleed; HTN; HLD; bipolar; and chronic diastolic CHF.    PT Comments    Upon arrival, pt found in floor next to his bed, sitting on top of his R foot/LE with trunk propped against bed. Pt reported that he was attempting to use his urinal by himself and lost his balance. Pt with very recent R first ray amputation on 9/13 and is NWB R LE. Pt was assisted back onto bed and then performed sit<>stand transfer with assistance in attempt to use urinal. Pt's RN present in room at end of session to further assess. Pt would continue to benefit from skilled physical therapy services at this time while admitted and after d/c to address the below listed limitations in order to improve overall safety and independence with functional mobility.     Follow Up Recommendations  Home health PT;Supervision/Assistance - 24 hour     Equipment Recommendations  None recommended by PT    Recommendations for Other Services       Precautions / Restrictions Precautions Precautions: Fall Precaution Comments: of note, pt found in floor upon arrival today (9/21) Restrictions Weight Bearing Restrictions: Yes RLE Weight Bearing: Non weight bearing    Mobility  Bed Mobility Overal bed mobility: Needs Assistance Bed Mobility: Sit to Supine       Sit to supine: Supervision   General bed mobility comments: increased  time and effort  Transfers Overall transfer level: Needs assistance Equipment used: 2 person hand held assist Transfers: Sit to/from Stand Sit to Stand: Mod assist;+2 physical assistance         General transfer comment: pt assisted into standing to attempt to use urinal; increased assistance provided and required after pt's fall  Ambulation/Gait             General Gait Details: not assessed   Stairs             Wheelchair Mobility    Modified Rankin (Stroke Patients Only)       Balance Overall balance assessment: Needs assistance;History of Falls Sitting-balance support: No upper extremity supported Sitting balance-Leahy Scale: Good     Standing balance support: Bilateral upper extremity supported Standing balance-Leahy Scale: Poor                              Cognition Arousal/Alertness: Awake/alert Behavior During Therapy: WFL for tasks assessed/performed Overall Cognitive Status: Within Functional Limits for tasks assessed                                        Exercises      General Comments        Pertinent Vitals/Pain Pain Assessment: Faces Faces Pain Scale: Hurts even more Pain Location: R foot Pain Descriptors / Indicators: Aching;Discomfort;Grimacing Pain Intervention(s): Monitored during session;Repositioned    Home Living  Prior Function            PT Goals (current goals can now be found in the care plan section) Acute Rehab PT Goals PT Goal Formulation: With patient Time For Goal Achievement: 03/19/18 Potential to Achieve Goals: Good Progress towards PT goals: Progressing toward goals    Frequency    Min 3X/week      PT Plan Current plan remains appropriate    Co-evaluation              AM-PAC PT "6 Clicks" Daily Activity  Outcome Measure  Difficulty turning over in bed (including adjusting bedclothes, sheets and blankets)?: A  Little Difficulty moving from lying on back to sitting on the side of the bed? : A Little Difficulty sitting down on and standing up from a chair with arms (e.g., wheelchair, bedside commode, etc,.)?: Unable Help needed moving to and from a bed to chair (including a wheelchair)?: A Little Help needed walking in hospital room?: Total Help needed climbing 3-5 steps with a railing? : Total 6 Click Score: 12    End of Session   Activity Tolerance: Patient limited by fatigue;Patient limited by pain Patient left: in bed;with call bell/phone within reach;with bed alarm set;with nursing/sitter in room Nurse Communication: Mobility status;Other (comment)(pt found in floor upon arrival) PT Visit Diagnosis: Unsteadiness on feet (R26.81);Other abnormalities of gait and mobility (R26.89);Pain Pain - Right/Left: Right Pain - part of body: Ankle and joints of foot     Time: 9628-3662 PT Time Calculation (min) (ACUTE ONLY): 9 min  Charges:  $Therapeutic Activity: 8-22 mins                     Sherie Don, [place], DPT  Acute Rehabilitation Services Pager [phone] Office [place] 03/07/2018, 12:09 PM

## 2018-03-07 NOTE — Op Note (Signed)
[facility] Patient Name: Gen Clagg Procedure Date : 03/07/2018 MRN: [id number] Attending MD: Gatha Mayer , MD Date of Birth: 20-Oct-1970 CSN: [id number] Age: 47 Admit Type: Inpatient Procedure:                Upper GI endoscopy Indications:              Epigastric abdominal pain, Abnormal CT of the GI                            tract Providers:                Gatha Mayer, MD, Elna Breslow, RN, Nevin Bloodgood, Technician Referring MD:              Medicines:                Midazolam 2 mg IV, Fentanyl 25 micrograms IV,                            Cetacaine spray Complications:            No immediate complications. Estimated Blood Loss:     Estimated blood loss was minimal. Procedure:                Pre-Anesthesia Assessment:                           - Prior to the procedure, a History and Physical                            was performed, and patient medications and                            allergies were reviewed. The patient's tolerance of                            previous anesthesia was also reviewed. The risks                            and benefits of the procedure and the sedation                            options and risks were discussed with the patient.                            All questions were answered, and informed consent                            was obtained. Prior Anticoagulants: The patient                            last took Lovenox (enoxaparin) on the day of the  procedure. ASA Grade Assessment: III - A patient                            with severe systemic disease. After reviewing the                            risks and benefits, the patient was deemed in                            satisfactory condition to undergo the procedure.                           After obtaining informed consent, the endoscope was                            passed under direct vision. Throughout the                             procedure, the patient's blood pressure, pulse, and                            oxygen saturations were monitored continuously. The                            GIF-H190 ([id number]) Olympus Adult EGD was introduced                            through the mouth, and advanced to the second part                            of duodenum. The upper GI endoscopy was                            accomplished without difficulty. The patient                            tolerated the procedure well. Scope In: Scope Out: Findings:      Diffuse severe mucosal changes characterized by congestion, erythema,       friability (with contact bleeding), inflammation and ulceration were       found on the greater curvature of the gastric body and on the posterior       wall of the gastric antrum. Biopsies were taken with a cold forceps for       histology. Verification of patient identification for the specimen was       done. Estimated blood loss was minimal.      The exam was otherwise without abnormality.      The cardia and gastric fundus were normal on retroflexion. Impression:               - Congested, erythematous, friable (with contact                            bleeding), inflamed and ulcerated mucosa in the  greater curvature of the gastric body and posterior                            wall of the gastric antrum. Biopsied. THIS DID NOT                            INVOLVE ENTIRE STOMACH AS CT INDICATED - IT LOOKS                            LIKE ISCHEMIC INJURYT SO I DO THINK HE HAD A                            PERIOPERATIVE (AMPUTATION) EMBOLIC ISCHEMIC EVENT                            TO A BRANCH OF THE GASTRIC BLOOD SUPPLY                           - The examination was otherwise normal. Recommendation:           - Return patient to hospital ward for ongoing care.                           - Clear liquid diet.                           - Advance diet as  tolerated                           add hyoscamine SL tidAC                           I stoped Abx - this is not infections                           Stay on IV PPI for now - probably po tomorrow                           Dietitian consult ordered re: needs/supplements -                            hoping he will be able to get his calories in soon                            but could need support if not Procedure Code(s):        --- Professional ---                           [phone], Esophagogastroduodenoscopy, flexible,                            transoral; with biopsy, single or multiple Diagnosis Code(s):        --- Professional ---  K31.89, Other diseases of stomach and duodenum                           K92.2, Gastrointestinal hemorrhage, unspecified                           K29.70, Gastritis, unspecified, without bleeding                           K25.9, Gastric ulcer, unspecified as acute or                            chronic, without hemorrhage or perforation                           R10.13, Epigastric pain                           R93.3, Abnormal findings on diagnostic imaging of                            other parts of digestive tract CPT copyright 2017 [employer]. All rights reserved. The codes documented in this report are preliminary and upon coder review may  be revised to meet current compliance requirements. Gatha Mayer, MD 03/07/2018 2:06:50 PM This report has been signed electronically. Number of Addenda: 0

## 2018-03-07 NOTE — Progress Notes (Signed)
   Patient Name: Shane [name] Date of Encounter: 03/07/2018, 10:53 AM    Subjective  Epigastric pain, nausea and vomiting when tried liquids yesterday   Objective  BP (!) 153/84   Pulse 85   Temp 97.9 F (36.6 C) (Oral)   Resp 18   Ht 5\' 7"  (1.702 m)   Wt 93.1 kg   SpO2 93%   BMI 32.14 kg/m  NAD  CBC Latest Ref Rng & Units 03/07/2018 03/06/2018 03/05/2018  WBC 4.0 - 10.5 K/uL 12.5(H) 14.7(H) 15.9(H)  Hemoglobin 13.0 - 17.0 g/dL 9.0(L) 8.7(L) 9.0(L)  Hematocrit 39.0 - 52.0 % 28.2(L) 28.0(L) 28.9(L)  Platelets 150 - 400 K/uL 296 272 281       Assessment and Plan  Gastritis - radiologically better - did not tolerate liquids  EGD today The risks and benefits as well as alternatives of endoscopic procedure(s) have been discussed and reviewed. All questions answered. The patient agrees to proceed.   Gatha Mayer, MD, [facility] 03/07/2018 10:53 AM Pager [phone]

## 2018-03-08 DIAGNOSIS — K559 Vascular disorder of intestine, unspecified: Secondary | ICD-10-CM

## 2018-03-08 LAB — CBC
HCT: 26.7 % — ABNORMAL LOW (ref 39.0–52.0)
Hemoglobin: 8.4 g/dL — ABNORMAL LOW (ref 13.0–17.0)
MCH: 29.3 pg (ref 26.0–34.0)
MCHC: 31.5 g/dL (ref 30.0–36.0)
MCV: 93 fL (ref 78.0–100.0)
PLATELETS: 311 10*3/uL (ref 150–400)
RBC: 2.87 MIL/uL — AB (ref 4.22–5.81)
RDW: 13 % (ref 11.5–15.5)
WBC: 9.3 10*3/uL (ref 4.0–10.5)

## 2018-03-08 LAB — COMPREHENSIVE METABOLIC PANEL
ALT: 30 U/L (ref 0–44)
AST: 34 U/L (ref 15–41)
Albumin: 2.2 g/dL — ABNORMAL LOW (ref 3.5–5.0)
Alkaline Phosphatase: 45 U/L (ref 38–126)
Anion gap: 7 (ref 5–15)
BUN: 7 mg/dL (ref 6–20)
CHLORIDE: 103 mmol/L (ref 98–111)
CO2: 30 mmol/L (ref 22–32)
CREATININE: 1.26 mg/dL — AB (ref 0.61–1.24)
Calcium: 8 mg/dL — ABNORMAL LOW (ref 8.9–10.3)
GFR calc non Af Amer: >60 mL/min (ref >60)
Glucose, Bld: 104 mg/dL — ABNORMAL HIGH (ref 70–99)
Potassium: 3.8 mmol/L (ref 3.5–5.1)
SODIUM: 140 mmol/L (ref 135–145)
Total Bilirubin: 0.2 mg/dL — ABNORMAL LOW (ref 0.3–1.2)
Total Protein: 4.6 g/dL — ABNORMAL LOW (ref 6.5–8.1)

## 2018-03-08 LAB — LIPID PANEL
Cholesterol: 86 mg/dL (ref 0–200)
HDL: 23 mg/dL — ABNORMAL LOW (ref >40)
LDL Cholesterol: 43 mg/dL (ref 0–99)
TRIGLYCERIDES: 99 mg/dL (ref <150)
Total CHOL/HDL Ratio: 3.7 RATIO
VLDL: 20 mg/dL (ref 0–40)

## 2018-03-08 LAB — MAGNESIUM: Magnesium: 1.9 mg/dL (ref 1.7–2.4)

## 2018-03-08 LAB — GLUCOSE, CAPILLARY
GLUCOSE-CAPILLARY: 226 mg/dL — AB (ref 70–99)
Glucose-Capillary: 79 mg/dL (ref 70–99)

## 2018-03-08 LAB — HEMOGLOBIN A1C
HEMOGLOBIN A1C: 7.5 % — AB (ref 4.8–5.6)
Mean Plasma Glucose: 168.55 mg/dL

## 2018-03-08 MED ORDER — ASPIRIN 81 MG PO CHEW
81.0000 mg | CHEWABLE_TABLET | Freq: Every day | ORAL | Status: DC
  Administered 2018-03-08 – 2018-03-10 (×3): 81 mg via ORAL
  Filled 2018-03-08 (×3): qty 1

## 2018-03-08 MED ORDER — DICYCLOMINE HCL 20 MG PO TABS
20.0000 mg | ORAL_TABLET | Freq: Three times a day (TID) | ORAL | Status: DC
  Administered 2018-03-08 – 2018-03-10 (×5): 20 mg via ORAL
  Filled 2018-03-08 (×5): qty 1

## 2018-03-08 MED ORDER — PANTOPRAZOLE SODIUM 40 MG PO TBEC
40.0000 mg | DELAYED_RELEASE_TABLET | Freq: Every day | ORAL | Status: DC
  Administered 2018-03-09 – 2018-03-10 (×2): 40 mg via ORAL
  Filled 2018-03-08 (×2): qty 1

## 2018-03-08 MED ORDER — HYDROCODONE-ACETAMINOPHEN 7.5-325 MG PO TABS
1.0000 | ORAL_TABLET | Freq: Four times a day (QID) | ORAL | Status: DC | PRN
  Administered 2018-03-08 – 2018-03-10 (×4): 1 via ORAL
  Filled 2018-03-08 (×4): qty 1

## 2018-03-08 NOTE — Evaluation (Signed)
Occupational Therapy Evaluation Patient Details Name: Shane [name] MRN: [id number] DOB: 08-25-1970 Today's Date: 03/08/2018    History of Present Illness Patient is a 47 y/o male presenting to the ED on 03/02/18 with primary complaints of abdominal pain. Of note, recent hospitalization for R first ray amputation. CT with marked gastritis with intramural ulceration. PMH significant for DM; CVA; Jehovah's witness; chronic diabetic foot ulcer s/p I&D on 8/7, recently completed 1 month of doxycycline, with subsequent R foot cellulitis requiring hospitalization through 9/8 and first ray amputation 9/13; lower GI bleed; HTN; HLD; bipolar; and chronic diastolic CHF.   Clinical Impression   Patient to be seen for skilled OT to maximize I and safety with ADLs and mobility. Patient is currently setup for ADLs and that is his baseline. Patient is not allowed to shower currently secondary to toe AMB and is taking bed baths. Patient is Min A for transfers and states his wife can assist at this level. Patient does not require further acute OT at this time.     Follow Up Recommendations  No OT follow up    Equipment Recommendations  None recommended by OT    Recommendations for Other Services       Precautions / Restrictions Precautions Precautions: Fall Restrictions Weight Bearing Restrictions: Yes RLE Weight Bearing: Non weight bearing Other Position/Activity Restrictions: per pt non weightbearing       Mobility Bed Mobility         Supine to sit: Modified independent (Device/Increase time) Sit to supine: Modified independent (Device/Increase time)      Transfers       Sit to Stand: Min assist Stand pivot transfers: Min assist            Balance                                           ADL either performed or assessed with clinical judgement   ADL Overall ADL's : Needs assistance/impaired Eating/Feeding: Independent   Grooming: Wash/dry  hands;Wash/dry face;Set up   Upper Body Bathing: Set up;Sitting   Lower Body Bathing: Set up;Bed level   Upper Body Dressing : Set up;Sitting   Lower Body Dressing: Set up   Toilet Transfer: Minimal assistance   Toileting- Clothing Manipulation and Hygiene: Minimal assistance       Functional mobility during ADLs: Minimal assistance General ADL Comments: Patient reports he is close to baseline and his wife is able to assist at this level.     Vision Baseline Vision/History: Wears glasses;Cataracts(Patient reports his vision is blurry from the cataract ) Wears Glasses: At all times Patient Visual Report: No change from baseline;Blurring of vision(Patient has a retinol detachment on l eye)       Perception     Praxis      Pertinent Vitals/Pain Pain Assessment: 0-10 Pain Score: 8  Pain Location: (R foot) Pain Descriptors / Indicators: Aching;Crushing Pain Intervention(s): Limited activity within patient's tolerance;Monitored during session;Patient requesting pain meds-RN notified     Hand Dominance Right   Extremity/Trunk Assessment Upper Extremity Assessment Upper Extremity Assessment: Overall WFL for tasks assessed           Communication Communication Communication: No difficulties   Cognition Arousal/Alertness: Awake/alert Behavior During Therapy: WFL for tasks assessed/performed Overall Cognitive Status: Within Functional Limits for tasks assessed  General Comments       Exercises     Shoulder Instructions      Home Living Family/patient expects to be discharged to:: Private residence Living Arrangements: Spouse/significant other;Children Available Help at Discharge: Family;Available 24 hours/day Type of Home: Apartment Home Access: Level entry     Home Layout: One level     Bathroom Shower/Tub: Teacher, early years/pre: Standard     Home Equipment: Environmental consultant - 4 wheels;Cane -  single point;Wheelchair - manual   Additional Comments: patient reports no other equipment at home       Prior Functioning/Environment Level of Independence: Needs assistance  Gait / Transfers Assistance Needed: was using w/c for mobility, could perform transfers himself and mobilize himself in w/c ADL's / Homemaking Assistance Needed: Patient reports he was setup for ADLs. Patient wife does the cooking and cleaning.            OT Problem List:        OT Treatment/Interventions:      OT Goals(Current goals can be found in the care plan section) Acute Rehab OT Goals Patient Stated Goal: go home  OT Frequency:     Barriers to D/C:            Co-evaluation              AM-PAC PT "6 Clicks" Daily Activity     Outcome Measure Help from another person eating meals?: None Help from another person taking care of personal grooming?: A Little Help from another person toileting, which includes using toliet, bedpan, or urinal?: A Little Help from another person bathing (including washing, rinsing, drying)?: A Little Help from another person to put on and taking off regular upper body clothing?: A Little Help from another person to put on and taking off regular lower body clothing?: A Little 6 Click Score: 19   End of Session Nurse Communication: Patient requests pain meds  Activity Tolerance: Patient limited by pain Patient left: in bed;with call bell/phone within reach;with bed alarm set  OT Visit Diagnosis: Unsteadiness on feet (R26.81)                Time: 1017-5102 OT Time Calculation (min): 38 min Charges:  OT General Charges $OT Visit: 1 Visit OT Evaluation $OT Eval Low Complexity: 1 Low OT Treatments $Self Care/Home Management : 5-85 mins  6 clicks  Dyke Weible 03/08/2018, 8:24 AM

## 2018-03-08 NOTE — Progress Notes (Addendum)
   Patient Name: Shane [name] Date of Encounter: 03/08/2018, 1:33 PM    Subjective  Foot pain Mild epigastric pain Tolerated clear liquids + BM  Objective  BP (!) 142/87   Pulse 84   Temp 98.9 F (37.2 C) (Oral)   Resp 16   Ht 5\' 7"  (1.702 m)   Wt 93.1 kg   SpO2 91%   BMI 32.14 kg/m  NAD Anicteric Abdomen soft, slight epigastric tenderness  CBC Latest Ref Rng & Units 03/08/2018 03/07/2018 03/06/2018  WBC 4.0 - 10.5 K/uL 9.3 12.5(H) 14.7(H)  Hemoglobin 13.0 - 17.0 g/dL 8.4(L) 9.0(L) 8.7(L)  Hematocrit 39.0 - 52.0 % 26.7(L) 28.2(L) 28.0(L)  Platelets 150 - 400 K/uL 311 296 272       Assessment and Plan  Gastritis - I believe from embolic event (plaque likely) after/during amputation He is MUCH better Anemia - multifactorial  Advance diet (ordered) PPI qd at dc Dicyclomine 20 mg qac # 90 no refill at dc - the hyoscyamine will be too expensive I will f/u path and arrange follow-up with [facility] No contrainidication to antiPLT or anticoags at this time DC carafate and GI cocktail   Signing off   Gatha Mayer, MD, [facility] 03/08/2018 1:33 PM Pager [phone]

## 2018-03-08 NOTE — Progress Notes (Signed)
@IPLOG @        PROGRESS NOTE                                                                                                                                                                                                             Patient Demographics:    Shane [name], is a 47 y.o. male, DOB - 11/04/1970, IRW:[id number]  Admit date - 03/02/2018   Admitting Physician Karmen Bongo, MD  Outpatient Primary MD for the patient is Charlott Rakes, MD  LOS - 6  Chief Complaint  Patient presents with  . Abdominal Pain       Brief Narrative  Shane [name] is a 47 y.o. male with medical history significant of DM; CVA; Jehovah's witness; chronic diabetic foot ulcer s/p I&D on 8/7, recently completed 1 month of doxycycline, with subsequent R foot cellulitis requiring hospitalization through 9/8 and first ray amputation 9/13; lower GI bleed; HTN; HLD; bipolar; and chronic diastolic CHF presenting with abdominal pain found to have severe gastritis on CT scan.   Subjective:   Patient in bed, appears comfortable, denies any headache, no fever, no chest pain or pressure, no shortness of breath , improved abdominal pain. No focal weakness.    Assessment  & Plan :     1. Severe gastritis.  Had CT evidence of gastritis, his symptoms continued hence GI conducted an EGD on 03/07/2018 showing changes consistent with embolic ischemic injury.  With supportive care symptoms have improved on 03/08/2018, currently on aspirin and statin for secondary prevention. Will check LDL A1c along with an echogram as well to complete embolic work-up.  Continue soft to clear liquid diet along with PPI and monitor.  2.  Recent right diabetic foot gangrene requiring surgery by Dr. Sharol Given last week.  Continue local wound care, no weightbearing in the right foot, Dr. Sharol Given evaluated the patient on 03/04/2018 here.  3.  DM type II.  Currently on combination of Lantus and sliding scale, Lantus dose has been adjusted to good effect  continue to monitor and adjust.  Lab Results  Component Value Date   HGBA1C 7.5 (H) 03/08/2018   CBG (last 3)  Recent Labs    03/07/18 1955 03/07/18 2228 03/08/18 0848  GLUCAP 160* 182* 79    4.  Dyslipidemia.  On statin continue.  5.  Essential hypertension.  Currently on combination of Norvasc, Coreg, have added oral scheduled hydralazine for better control along with PRN hydralazine.  Monitor and adjust.  6.  BPH.  On Flomax continue.  7.  Chronic diastolic CHF.  EF 60% on echocardiogram done in February 2018.  Currently compensated.    Family Communication  :  None  Code Status :  Full  Disposition Plan  :  Tele  Consults  :  GI, general surgery, orthopedics  Procedures  :   EGD- Congested, erythematous, friable (with contact bleeding), inflamed and ulcerated mucosa in the greater curvature of the gastric body and posterior wall of the gastric antrum. Biopsied. THIS DID NOT INVOLVE ENTIRE STOMACH AS CT INDICATED - IT LOOKS LIKE ISCHEMIC INJURYT SO I DO THINK HE HAD A PERIOPERATIVE (AMPUTATION) EMBOLIC ISCHEMIC EVENT TO A BRANCH OF THE GASTRIC BLOOD SUPPLY - The examination was otherwise normal.  CT - 1. Marked diffuse gastric wall edema is identified with areas of intramural ulceration in the fundus and gastric cardia. Findings compatible with gastritis. No evidence for perforation. No pneumoperitoneum identified. 2. Mild bilateral perinephric fat stranding identified without hydronephrosis, nonspecific but may be related to prior insult. 3.  Aortic Atherosclerosis (ICD10-I70.0). 4. Small left pleural effusion and left lower lobe subsegmental atelectasis  DVT Prophylaxis  :  Lovenox   Lab Results  Component Value Date   PLT 311 03/08/2018    Diet :  Diet Order            Diet clear liquid Room service appropriate? Yes; Fluid consistency: Thin  Diet effective now               Inpatient Medications Scheduled Meds: . amLODipine  5 mg Oral Daily  .  aspirin  81 mg Oral Daily  . atorvastatin  80 mg Oral q morning - 10a  . carbamazepine  200 mg Oral QHS  . carvedilol  25 mg Oral BID WC  . docusate sodium  100 mg Oral BID  . enoxaparin (LOVENOX) injection  40 mg Subcutaneous Q24H  . FLUoxetine  40 mg Oral Daily  . gabapentin  300 mg Oral QHS  . gi cocktail  30 mL Oral TID  . hydrALAZINE  50 mg Oral Q8H  . hyoscyamine  0.25 mg Sublingual TID AC  . insulin aspart  0-15 Units Subcutaneous TID WC  . insulin aspart  0-5 Units Subcutaneous QHS  . insulin glargine  20 Units Subcutaneous BID  . pantoprazole (PROTONIX) IV  40 mg Intravenous Q12H  . sodium chloride flush  3 mL Intravenous Q12H  . sucralfate  1 g Oral TID WC & HS  . tamsulosin  0.4 mg Oral Daily  . traZODone  100 mg Oral QHS   Continuous Infusions:  PRN Meds:.hydrALAZINE, HYDROcodone-acetaminophen, [DISCONTINUED] ondansetron **OR** ondansetron (ZOFRAN) IV  Antibiotics  :   Anti-infectives (From admission, onward)   Start     Dose/Rate Route Frequency Ordered Stop   03/03/18 1400  cefTRIAXone (ROCEPHIN) 2 g in sodium chloride 0.9 % 100 mL IVPB  Status:  Discontinued     2 g 200 mL/hr over 30 Minutes Intravenous Every 24 hours 03/02/18 1601 03/07/18 1411   03/02/18 2000  metroNIDAZOLE (FLAGYL) IVPB 500 mg  Status:  Discontinued     500 mg 100 mL/hr over 60 Minutes Intravenous Every 8 hours 03/02/18 1601 03/07/18 1411   03/02/18 1330  cefTRIAXone (ROCEPHIN) 2 g in sodium chloride 0.9 % 100 mL IVPB     2 g 200 mL/hr over 30 Minutes Intravenous  Once 03/02/18 1326 03/02/18 1413   03/02/18 1330  metroNIDAZOLE (FLAGYL) IVPB 500 mg     500 mg 100  mL/hr over 60 Minutes Intravenous  Once 03/02/18 1326 03/02/18 1605          Objective:   Vitals:   03/07/18 1500 03/07/18 2150 03/08/18 0548 03/08/18 0852  BP: (!) 154/91 (!) 162/98 (!) 161/90 (!) 187/95  Pulse: 79 83 76 82  Resp: 12 16 16    Temp: 98.8 F (37.1 C) 98.5 F (36.9 C) 98.9 F (37.2 C)   TempSrc: Oral Oral  Oral   SpO2: 96% 98% 91%   Weight:      Height:        Wt Readings from Last 3 Encounters:  03/03/18 93.1 kg  02/28/18 90 kg  02/24/18 89.4 kg     Intake/Output Summary (Last 24 hours) at 03/08/2018 1055 Last data filed at 03/08/2018 0422 Gross per 24 hour  Intake 720.33 ml  Output 950 ml  Net -229.67 ml     Physical Exam  Awake Alert, Oriented X 3, No new F.N deficits, Normal affect [place].AT,PERRAL Supple Neck,No JVD, No cervical lymphadenopathy appriciated.  Symmetrical Chest wall movement, Good air movement bilaterally, CTAB RRR,No Gallops, Rubs or new Murmurs, No Parasternal Heave +ve B.Sounds, Abd Soft, minimal epigastric tenderness, No organomegaly appriciated, No rebound - guarding or rigidity. No Cyanosis, Clubbing or edema, No new Rash or bruise     Data Review:    CBC Recent Labs  Lab 03/05/18 0454 03/05/18 1550 03/06/18 0503 03/07/18 0532 03/08/18 0556  WBC 20.7* 15.9* 14.7* 12.5* 9.3  HGB 9.5* 9.0* 8.7* 9.0* 8.4*  HCT 30.1* 28.9* 28.0* 28.2* 26.7*  PLT 288 281 272 296 311  MCV 94.4 94.1 94.0 93.4 93.0  MCH 29.8 29.3 29.2 29.8 29.3  MCHC 31.6 31.1 31.1 31.9 31.5  RDW 12.9 12.8 12.8 12.9 13.0  LYMPHSABS  --  2.1  --   --   --   MONOABS  --  0.9  --   --   --   EOSABS  --  0.4  0.4  --   --   --   BASOSABS  --  0.1  --   --   --     Chemistries  Recent Labs  Lab 03/03/18 0753 03/04/18 0516 03/05/18 0454 03/06/18 0503 03/07/18 0532  NA 137 137 137 137 137  K 3.7 3.8 3.7 3.9 3.7  CL 100 102 104 103 102  CO2 28 27 27 29 29   GLUCOSE 223* 150* 72 103* 84  BUN 14 13 12 9 8   CREATININE 1.04 1.13 1.15 1.12 1.11  CALCIUM 8.4* 8.1* 8.1* 7.9* 7.9*  MG 1.6* 2.2 2.1 2.0  --   AST 19 15 17 17 26   ALT 44 29 24 21 24   ALKPHOS 55 51 63 52 48  BILITOT 0.4 0.5 0.5 0.4 0.5   ------------------------------------------------------------------------------------------------------------------ Recent Labs    03/08/18 0556  CHOL 86  HDL 23*  LDLCALC 43   TRIG 99  CHOLHDL 3.7    Lab Results  Component Value Date   HGBA1C 7.5 (H) 03/08/2018   ------------------------------------------------------------------------------------------------------------------ No results for input(s): TSH, T4TOTAL, T3FREE, THYROIDAB in the last 72 hours.  Invalid input(s): FREET3 ------------------------------------------------------------------------------------------------------------------ Recent Labs    03/07/18 0532  VITAMINB12 316  FOLATE 14.5  FERRITIN 96  TIBC 175*  IRON 48  RETICCTPCT 1.8    Coagulation profile No results for input(s): INR, PROTIME in the last 168 hours.  No results for input(s): DDIMER in the last 72 hours.  Cardiac Enzymes No results for input(s): CKMB, TROPONINI, MYOGLOBIN  in the last 168 hours.  Invalid input(s): CK ------------------------------------------------------------------------------------------------------------------    Component Value Date/Time   BNP 22.4 02/26/2016 1650    Micro Results Recent Results (from the past 240 hour(s))  Culture, Urine     Status: None   Collection Time: 03/02/18  9:46 AM  Result Value Ref Range Status   Specimen Description URINE, RANDOM  Final   Special Requests NONE  Final   Culture   Final    NO GROWTH Performed at [facility], [address] [place], [place] [address]    Report Status 03/04/2018 FINAL  Final  Culture, blood (routine x 2)     Status: None   Collection Time: 03/02/18  5:22 PM  Result Value Ref Range Status   Specimen Description BLOOD LEFT ANTECUBITAL  Final   Special Requests   Final    BOTTLES DRAWN AEROBIC ONLY Blood Culture adequate volume   Culture   Final    NO GROWTH 5 DAYS Performed at [facility], [place] [address], [place], [place] [address]    Report Status 03/07/2018 FINAL  Final  Culture, blood (routine x 2)     Status: None   Collection Time: 03/02/18  5:30 PM  Result Value Ref Range Status   Specimen  Description BLOOD LEFT HAND  Final   Special Requests   Final    BOTTLES DRAWN AEROBIC ONLY Blood Culture adequate volume   Culture   Final    NO GROWTH 5 DAYS Performed at [facility], [place] [address] [place], [place] [address]    Report Status 03/07/2018 FINAL  Final    Radiology Reports  Dg Chest 2 View  Result Date: 03/02/2018 CLINICAL DATA:  Weakness, sepsis EXAM: CHEST - 2 VIEW COMPARISON:  02/08/2018, 01/24/2018, CT 01/24/2018 FINDINGS: Patchy left lower lobe opacity. No pleural effusion. Stable cardiomediastinal silhouette. No pneumothorax. IMPRESSION: Low lung volumes. Patchy airspace disease in the left lower lobe may reflect atelectasis or mild pneumonia. Electronically Signed   By: Donavan Foil M.D.   On: 03/02/2018 19:39   Dg Chest 2 View  Result Date: 02/08/2018 CLINICAL DATA:  Chest pain EXAM: CHEST - 2 VIEW COMPARISON:  01/24/2018 chest radiograph. FINDINGS: Stable cardiomediastinal silhouette with normal heart size. No pneumothorax. No pleural effusion. Lungs appear clear, with no acute consolidative airspace disease and no pulmonary edema. IMPRESSION: No active cardiopulmonary disease. Electronically Signed   By: Ilona Sorrel M.D.   On: 02/08/2018 14:00   Ct Abdomen W Contrast  Result Date: 03/06/2018 CLINICAL DATA:  FOLLOWUP GASTRIC WALL THICKENING. EXAM: CT ABDOMEN WITH CONTRAST TECHNIQUE: Multidetector CT imaging of the abdomen was performed using the standard protocol following bolus administration of intravenous contrast. CONTRAST:  182mL OMNIPAQUE IOHEXOL 300 MG/ML  SOLN COMPARISON:  CT scan 03/02/2018 FINDINGS: Lower chest: Small bilateral pleural effusions with overlying atelectasis, left greater than right. The heart is normal in size. No pericardial effusion. The distal esophagus is grossly normal. Hepatobiliary: No focal hepatic lesions or intrahepatic biliary dilatation. The gallbladder is normal. No common bile duct dilatation. Pancreas: No mass,  inflammation or ductal dilatation. Spleen: Normal size.  No focal lesions. Adrenals/Urinary Tract: The adrenal glands and kidneys are unremarkable. No renal lesions or hydronephrosis. Stomach/Bowel: Much improved CT appearance of the stomach. The marked gastric wall thickening is vastly improved. There is some residual mild wall thickening and some surrounding interstitial edema but no gastric ulcer or gastric outlet obstruction. The duodenum, small bowel and  colon are unremarkable. No acute inflammatory changes, mass lesions or obstructive findings. Vascular/Lymphatic: The aorta and branch vessels are patent. The major venous structures are patent. No mesenteric or retroperitoneal mass or adenopathy. Small scattered lymph nodes are stable. Other: No ascites or abdominal wall hernia. Mild subcutaneous edema is noted. Musculoskeletal: No significant bony findings. IMPRESSION: 1. Marked improved CT appearance of the stomach when compared to the prior study from 03/02/2018. No gastric ulcer, mass or outlet obstruction. 2. Small bilateral pleural effusions with overlying atelectasis, left greater than right. 3. No free air or evidence of oral contrast extravasation. Electronically Signed   By: Marijo Sanes M.D.   On: 03/06/2018 12:57   Ct Abdomen Pelvis W Contrast  Result Date: 03/02/2018 CLINICAL DATA:  Abdominal pain. EXAM: CT ABDOMEN AND PELVIS WITH CONTRAST TECHNIQUE: Multidetector CT imaging of the abdomen and pelvis was performed using the standard protocol following bolus administration of intravenous contrast. CONTRAST:  120mL OMNIPAQUE IOHEXOL 300 MG/ML  SOLN COMPARISON:  CT chest 01/24/2018. FINDINGS: Lower chest: Small left pleural effusion. Subsegmental atelectasis noted in the left lower lobe. Hepatobiliary: No focal liver abnormality is seen. No gallstones, gallbladder wall thickening, or biliary dilatation. Pancreas: Unremarkable. No pancreatic ductal dilatation or surrounding inflammatory changes.  Spleen: Normal in size without focal abnormality. Adrenals/Urinary Tract: Normal adrenal glands. Bilateral perinephric fat stranding identified. No mass or hydronephrosis identified. Urinary bladder appears normal. Stomach/Bowel: The stomach is diffusely abnormal in appearance with marked diffuse gastric wall edema and Peri cast strict fat stranding. Areas of intramural ulceration containing gas is identified along the lesser curvature of the gastric fundus and gastric cardia, image 58/6 and image 72/6. No evidence for gastric perforation identified. No significant wall thickening involving the duodenum. No abnormal small bowel wall thickening or inflammation. No pathologic dilatation of the colon. Vascular/Lymphatic: Aortic atherosclerosis. No aneurysm. No upper abdominal adenopathy identified. No pelvic or inguinal adenopathy. Reproductive: Prostate gland and seminal vesicles are unremarkable. Other: No significant free fluid or fluid collections. Musculoskeletal: No acute or significant osseous findings. IMPRESSION: 1. Marked diffuse gastric wall edema is identified with areas of intramural ulceration in the fundus and gastric cardia. Findings compatible with gastritis. No evidence for perforation. No pneumoperitoneum identified. 2. Mild bilateral perinephric fat stranding identified without hydronephrosis, nonspecific but may be related to prior insult. 3.  Aortic Atherosclerosis (ICD10-I70.0). 4. Small left pleural effusion and left lower lobe subsegmental atelectasis. Electronically Signed   By: Kerby Moors M.D.   On: 03/02/2018 13:24   Mr Foot Right W Wo Contrast  Result Date: 02/21/2018 CLINICAL DATA:  Open wound of the right foot on the first digit. Prior surgeries. Diabetes. EXAM: MRI OF THE RIGHT FOREFOOT WITHOUT AND WITH CONTRAST TECHNIQUE: Multiplanar, multisequence MR imaging of the right forefoot was performed before and after the administration of intravenous contrast. CONTRAST:  8 cc Gadavist  COMPARISON:  Multiple exams, including 01/12/2018 and radiographs of 02/18/2018 FINDINGS: Bones/Joint/Cartilage Low-grade but abnormal edema and enhancement in the phalanges of the great toe; head and shaft of the first metatarsal; and in the head and distal shaft of the second metatarsal suspicious for early osteomyelitis. The first digit sesamoids do not appear to abnormally enhance. Trace effusion of the first MTP joint dorsally. Ligaments Today's exam focused on the distal forefoot, and the level of the Lisfranc ligament was omitted. Muscles and Tendons Abnormal edema signal and enhancement especially in the abductor hallucis muscle compatible with myositis. There is a lesser degree of abnormal muscular edema and  enhancement in the interosseous muscles and remaining plantar musculature of the foot, some of which may be neurogenic. Soft tissues Deep ulceration of the medial aspect of the great toe extending to the periosteal margin of the base of the distal phalanx as shown on image 26/11. Additional ulceration medial to the first MTP joint with surrounding subcutaneous edema and inflammatory stranding compatible with cellulitis which extends into the great toe. Questionable foreign body along the dorsum of the foot on image 6/11, dorsal to the second metatarsal shaft, with some surrounding edema and enhancement. This could also represent a small locule of gas. IMPRESSION: 1. Deep ulceration of the medial great toe extending to the base of the distal phalanx periosteal margin. There is abnormal edema and enhancement in the phalanges of the great toe; in the head and shaft of the first metatarsal; and in the head and distal shaft of the second metatarsal suspicious for early osteomyelitis. 2. 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 3. Cellulitis of the great toe and medial distal foot. 4. Shallow ulceration medial to the first MTP joint.  5. Low signal intensity structure potentially a small foreign body or locule of gas in the dorsal subcutaneous tissues of the foot on image 6/6, dorsal to the level of the second metatarsal, not readily visible on conventional radiography. Electronically Signed   By: Van Clines M.D.   On: 02/21/2018 13:54   Dg Abd Portable 1v  Result Date: 03/04/2018 CLINICAL DATA:  Central abdominal pain for several days EXAM: PORTABLE ABDOMEN - 1 VIEW COMPARISON:  None. FINDINGS: Scattered large and small bowel gas is noted. No obstructive changes are seen. No free air is noted. Vas deferens calcifications are seen. No acute bony abnormality is noted. IMPRESSION: No acute abnormality noted. Electronically Signed   By: Inez Catalina M.D.   On: 03/04/2018 08:45   Dg Foot Complete Right  Result Date: 02/18/2018 CLINICAL DATA:  Diabetic patient with a wound on the right great toe. EXAM: RIGHT FOOT COMPLETE - 3+ VIEW COMPARISON:  Plain films right foot 02/08/2018. MRI right foot 01/12/2018. FINDINGS: Skin wound is seen along the medial aspect of the first MTP joint. Soft tissues of the foot are swollen. No soft tissue gas or radiopaque foreign body. No bony destructive change or periosteal reaction. IMPRESSION: Skin ulceration soft tissue swelling without evidence of osteomyelitis. Electronically Signed   By: Inge Rise M.D.   On: 02/18/2018 11:14   Dg Foot Complete Right  Result Date: 02/08/2018 CLINICAL DATA:  Poor healing surgical wounds to right foot. History of diabetes. EXAM: RIGHT FOOT COMPLETE - 3+ VIEW COMPARISON:  None. FINDINGS: No fractures. No evidence of osteomyelitis. Vascular calcifications are noted. IMPRESSION: No evidence of osteomyelitis. Electronically Signed   By: Dorise Bullion III M.D   On: 02/08/2018 15:14    Time Spent in minutes  30   Lala Lund M.D on 03/08/2018 at 10:55 AM  To page go to [email or web address] - password [email or web address]

## 2018-03-09 ENCOUNTER — Inpatient Hospital Stay (HOSPITAL_COMMUNITY): Payer: Medicare HMO

## 2018-03-09 ENCOUNTER — Encounter (HOSPITAL_COMMUNITY): Payer: Self-pay | Admitting: Internal Medicine

## 2018-03-09 DIAGNOSIS — I10 Essential (primary) hypertension: Secondary | ICD-10-CM

## 2018-03-09 LAB — COMPREHENSIVE METABOLIC PANEL
ALT: 44 U/L (ref 0–44)
ANION GAP: 7 (ref 5–15)
AST: 46 U/L — AB (ref 15–41)
Albumin: 2.2 g/dL — ABNORMAL LOW (ref 3.5–5.0)
Alkaline Phosphatase: 51 U/L (ref 38–126)
BILIRUBIN TOTAL: 0.3 mg/dL (ref 0.3–1.2)
BUN: 5 mg/dL — AB (ref 6–20)
CHLORIDE: 103 mmol/L (ref 98–111)
CO2: 30 mmol/L (ref 22–32)
Calcium: 8.1 mg/dL — ABNORMAL LOW (ref 8.9–10.3)
Creatinine, Ser: 1.22 mg/dL (ref 0.61–1.24)
Glucose, Bld: 73 mg/dL (ref 70–99)
POTASSIUM: 3.7 mmol/L (ref 3.5–5.1)
Sodium: 140 mmol/L (ref 135–145)
TOTAL PROTEIN: 5 g/dL — AB (ref 6.5–8.1)

## 2018-03-09 LAB — GLUCOSE, CAPILLARY
GLUCOSE-CAPILLARY: 62 mg/dL — AB (ref 70–99)
GLUCOSE-CAPILLARY: 107 mg/dL — AB (ref 70–99)
GLUCOSE-CAPILLARY: 136 mg/dL — AB (ref 70–99)
GLUCOSE-CAPILLARY: 61 mg/dL — AB (ref 70–99)
GLUCOSE-CAPILLARY: 123 mg/dL — AB (ref 70–99)
GLUCOSE-CAPILLARY: 152 mg/dL — AB (ref 70–99)
Glucose-Capillary: 108 mg/dL — ABNORMAL HIGH (ref 70–99)
Glucose-Capillary: 184 mg/dL — ABNORMAL HIGH (ref 70–99)

## 2018-03-09 LAB — ECHOCARDIOGRAM COMPLETE
Height: 67 in
Weight: 3283.2 oz

## 2018-03-09 MED ORDER — GLUCERNA SHAKE PO LIQD
237.0000 mL | Freq: Two times a day (BID) | ORAL | Status: DC
  Administered 2018-03-09 – 2018-03-10 (×2): 237 mL via ORAL
  Filled 2018-03-09: qty 237

## 2018-03-09 MED ORDER — INSULIN GLARGINE 100 UNIT/ML ~~LOC~~ SOLN
15.0000 [IU] | Freq: Two times a day (BID) | SUBCUTANEOUS | Status: DC
  Administered 2018-03-09 – 2018-03-10 (×2): 15 [IU] via SUBCUTANEOUS
  Filled 2018-03-09 (×2): qty 0.15

## 2018-03-09 NOTE — Progress Notes (Signed)
Initial Nutrition Assessment  DOCUMENTATION CODES:   Obesity unspecified  INTERVENTION:   - Glucerna Shake po BID, each supplement provides 220 kcal and 10 grams of protein  NUTRITION DIAGNOSIS:   Increased nutrient needs related to wound healing as evidenced by estimated needs.  GOAL:   Patient will meet greater than or equal to 90% of their needs  MONITOR:   PO intake, Supplement acceptance, Labs, I & O's, Skin, Weight trends  REASON FOR ASSESSMENT:   Consult Assessment of nutrition requirement/status  ASSESSMENT:   47 year old male who presented to the ED on 9/16 with abdominal pain and emesis. PMH significant for diabetes,  hypertension, hyperlipidemia, GERD, CVA, GERD, bipolar disorder, CKD stage III, and CHF. Pt with chronic diabetic foot ulcer s/p I&D on 8/7 then completed 1 month of doxycycline and had subsequent right foot cellulitis requiring hospitalization through 9/8 and right foot first ray amputation on 9/13.   Spoke with pt at bedside who reports appetite is improving. Pt denies N/V and states last episode of emesis was 2 days ago. Pt reports eating 100% of breakfast meal tray.  Pt states that he typically has a great appetite and eats 2 meals daily. Breakfast may include a breakfast sandwich and fruit. Dinner may include rice, beans, chicken, and vegetables.  Pt reports his UBW as 200-205 lbs and denies recent weight loss. Weight history in chart appears stable.  Pt agreeable to receiving Glucerna oral nutrition supplement to maximize protein intake during admission. Pt is looking forward to d/c.  Medications reviewed and include: 100 mg Colace BID, sliding scale Novolog, 20 units Lantus BID, 40 mg Protonix daily  Labs reviewed: hemoglobin 8.4 (L), HCT 26.7 (L), HDL 23 (L) Hemoglobin A1C 7.5 (H) CBG's: 108, 61, 136, 107, 226 x 24 hours  NUTRITION - FOCUSED PHYSICAL EXAM:    Most Recent Value  Orbital Region  No depletion  Upper Arm Region  No  depletion  Thoracic and Lumbar Region  No depletion  Buccal Region  No depletion  Temple Region  No depletion  Clavicle Bone Region  No depletion  Clavicle and Acromion Bone Region  No depletion  Scapular Bone Region  No depletion  Dorsal Hand  No depletion  Patellar Region  No depletion  Anterior Thigh Region  No depletion  Posterior Calf Region  No depletion  Edema (RD Assessment)  Mild  Hair  Reviewed  Eyes  Reviewed  Mouth  Reviewed  Skin  Reviewed  Nails  Reviewed       Diet Order:   Diet Order            Diet Carb Modified Fluid consistency: Thin; Room service appropriate? Yes  Diet effective now              EDUCATION NEEDS:   No education needs have been identified at this time  Skin:  Skin Assessment: Skin Integrity Issues: Incisions: surgical inision to R foot  Last BM:  03/08/18  Height:   Ht Readings from Last 1 Encounters:  03/03/18 5\' 7"  (1.702 m)    Weight:   Wt Readings from Last 1 Encounters:  03/03/18 93.1 kg    Ideal Body Weight:  67.27 kg  BMI:  Body mass index is 32.14 kg/m.  Estimated Nutritional Needs:   Kcal:  2200-2400  Protein:  110-125 grams  Fluid:  2.2-2.4 L    Gaynell Face, MS, RD, LDN Inpatient Clinical Dietitian Pager: [phone] Weekend/After Hours: [phone]

## 2018-03-09 NOTE — Progress Notes (Signed)
@IPLOG @        PROGRESS NOTE                                                                                                                                                                                                             Patient Demographics:    Shane [name], is a 47 y.o. male, DOB - 1970/08/01, WUJ:[id number]  Admit date - 03/02/2018   Admitting Physician Karmen Bongo, MD  Outpatient Primary MD for the patient is Charlott Rakes, MD  LOS - 7  Chief Complaint  Patient presents with  . Abdominal Pain       Brief Narrative  Shane [name] is a 47 y.o. male with medical history significant of DM; CVA; Jehovah's witness; chronic diabetic foot ulcer s/p I&D on 8/7, recently completed 1 month of doxycycline, with subsequent R foot cellulitis requiring hospitalization through 9/8 and first ray amputation 9/13; lower GI bleed; HTN; HLD; bipolar; and chronic diastolic CHF presenting with abdominal pain found to have severe gastritis on CT scan.   Subjective:   Patient in bed, appears comfortable, denies any headache, no fever, no chest pain or pressure, no shortness of breath , no abdominal pain. No focal weakness.   Assessment  & Plan :     1. Severe gastritis.  Had CT evidence of gastritis, his symptoms continued hence GI conducted an EGD on 03/07/2018 showing changes consistent with embolic ischemic injury.  With supportive care symptoms have improved on 03/08/2018, currently on aspirin and statin for secondary prevention. Will check LDL A1c along with an echogram as well to complete embolic work-up.  Continue soft to clear liquid diet along with PPI and monitor.  Lab Results  Component Value Date   HGBA1C 7.5 (H) 03/08/2018   Lab Results  Component Value Date   CHOL 86 03/08/2018   HDL 23 (L) 03/08/2018   LDLCALC 43 03/08/2018   TRIG 99 03/08/2018   CHOLHDL 3.7 03/08/2018    2.  Recent right diabetic foot gangrene requiring surgery by Shane [name] last week.  Continue  local wound care, no weightbearing in the right foot, Shane [name] evaluated the patient on 03/04/2018 here.  3.  DM type II.  Currently on combination of Lantus and sliding scale, Lantus dose has been adjusted to good effect continue to monitor and adjust.  Lab Results  Component Value Date   HGBA1C 7.5 (H) 03/08/2018   CBG (last 3)  Recent Labs    03/09/18 0804 03/09/18 0940 03/09/18 1151  GLUCAP 61* 108*  184*    4.  Dyslipidemia.  On statin continue.  5.  Essential hypertension.  Currently on combination of Norvasc, Coreg, have added oral scheduled hydralazine for better control along with PRN hydralazine.  Monitor and adjust.  6.  BPH.  On Flomax continue.  7.  Chronic diastolic CHF.  EF 60% on echocardiogram done in February 2018.  Currently compensated.    Family Communication  :  None  Code Status :  Full  Disposition Plan  :  Tele  Consults  :  GI, general surgery, orthopedics  Procedures  :   EGD- Congested, erythematous, friable (with contact bleeding), inflamed and ulcerated mucosa in the greater curvature of the gastric body and posterior wall of the gastric antrum. Biopsied. THIS DID NOT INVOLVE ENTIRE STOMACH AS CT INDICATED - IT LOOKS LIKE ISCHEMIC INJURYT SO I DO THINK HE HAD A PERIOPERATIVE (AMPUTATION) EMBOLIC ISCHEMIC EVENT TO A BRANCH OF THE GASTRIC BLOOD SUPPLY - The examination was otherwise normal.  CT - 1. Marked diffuse gastric wall edema is identified with areas of intramural ulceration in the fundus and gastric cardia. Findings compatible with gastritis. No evidence for perforation. No pneumoperitoneum identified. 2. Mild bilateral perinephric fat stranding identified without hydronephrosis, nonspecific but may be related to prior insult. 3.  Aortic Atherosclerosis (ICD10-I70.0). 4. Small left pleural effusion and left lower lobe subsegmental atelectasis  DVT Prophylaxis  :  Lovenox   Lab Results  Component Value Date   PLT 311 03/08/2018     Diet :  Diet Order            Diet Carb Modified Fluid consistency: Thin; Room service appropriate? Yes  Diet effective now               Inpatient Medications Scheduled Meds: . amLODipine  5 mg Oral Daily  . aspirin  81 mg Oral Daily  . atorvastatin  80 mg Oral q morning - 10a  . carbamazepine  200 mg Oral QHS  . carvedilol  25 mg Oral BID WC  . dicyclomine  20 mg Oral TID AC  . docusate sodium  100 mg Oral BID  . enoxaparin (LOVENOX) injection  40 mg Subcutaneous Q24H  . feeding supplement (GLUCERNA SHAKE)  237 mL Oral BID BM  . FLUoxetine  40 mg Oral Daily  . gabapentin  300 mg Oral QHS  . hydrALAZINE  50 mg Oral Q8H  . insulin aspart  0-15 Units Subcutaneous TID WC  . insulin aspart  0-5 Units Subcutaneous QHS  . insulin glargine  15 Units Subcutaneous BID  . pantoprazole  40 mg Oral QAC breakfast  . sodium chloride flush  3 mL Intravenous Q12H  . tamsulosin  0.4 mg Oral Daily  . traZODone  100 mg Oral QHS   Continuous Infusions:  PRN Meds:.hydrALAZINE, HYDROcodone-acetaminophen, [DISCONTINUED] ondansetron **OR** ondansetron (ZOFRAN) IV  Antibiotics  :   Anti-infectives (From admission, onward)   Start     Dose/Rate Route Frequency Ordered Stop   03/03/18 1400  cefTRIAXone (ROCEPHIN) 2 g in sodium chloride 0.9 % 100 mL IVPB  Status:  Discontinued     2 g 200 mL/hr over 30 Minutes Intravenous Every 24 hours 03/02/18 1601 03/07/18 1411   03/02/18 2000  metroNIDAZOLE (FLAGYL) IVPB 500 mg  Status:  Discontinued     500 mg 100 mL/hr over 60 Minutes Intravenous Every 8 hours 03/02/18 1601 03/07/18 1411   03/02/18 1330  cefTRIAXone (ROCEPHIN) 2 g in sodium chloride 0.9 %  100 mL IVPB     2 g 200 mL/hr over 30 Minutes Intravenous  Once 03/02/18 1326 03/02/18 1413   03/02/18 1330  metroNIDAZOLE (FLAGYL) IVPB 500 mg     500 mg 100 mL/hr over 60 Minutes Intravenous  Once 03/02/18 1326 03/02/18 1605          Objective:   Vitals:   03/08/18 2112 03/09/18 0501  03/09/18 0828 03/09/18 1025  BP: (!) 179/88 (!) 144/80 (!) 188/99 120/70  Pulse: 81 73  80  Resp: 18 16    Temp: 98.9 F (37.2 C) 98.4 F (36.9 C)    TempSrc: Oral Oral    SpO2: 99% 92%    Weight:      Height:        Wt Readings from Last 3 Encounters:  03/03/18 93.1 kg  02/28/18 90 kg  02/24/18 89.4 kg     Intake/Output Summary (Last 24 hours) at 03/09/2018 1417 Last data filed at 03/09/2018 1300 Gross per 24 hour  Intake -  Output 2850 ml  Net -2850 ml     Physical Exam  Awake Alert, Oriented X 3, No new F.N deficits, Normal affect [place].AT,PERRAL Supple Neck,No JVD, No cervical lymphadenopathy appriciated.  Symmetrical Chest wall movement, Good air movement bilaterally, CTAB RRR,No Gallops, Rubs or new Murmurs, No Parasternal Heave +ve B.Sounds, Abd Soft, No tenderness, No organomegaly appriciated, No rebound - guarding or rigidity. No Cyanosis, Clubbing or edema, No new Rash or bruise   Data Review:    CBC Recent Labs  Lab 03/05/18 0454 03/05/18 1550 03/06/18 0503 03/07/18 0532 03/08/18 0556  WBC 20.7* 15.9* 14.7* 12.5* 9.3  HGB 9.5* 9.0* 8.7* 9.0* 8.4*  HCT 30.1* 28.9* 28.0* 28.2* 26.7*  PLT 288 281 272 296 311  MCV 94.4 94.1 94.0 93.4 93.0  MCH 29.8 29.3 29.2 29.8 29.3  MCHC 31.6 31.1 31.1 31.9 31.5  RDW 12.9 12.8 12.8 12.9 13.0  LYMPHSABS  --  2.1  --   --   --   MONOABS  --  0.9  --   --   --   EOSABS  --  0.4  0.4  --   --   --   BASOSABS  --  0.1  --   --   --     Chemistries  Recent Labs  Lab 03/03/18 0753 03/04/18 0516 03/05/18 0454 03/06/18 0503 03/07/18 0532 03/08/18 0556 03/09/18 0517  NA 137 137 137 137 137 140 140  K 3.7 3.8 3.7 3.9 3.7 3.8 3.7  CL 100 102 104 103 102 103 103  CO2 28 27 27 29 29 30 30   GLUCOSE 223* 150* 72 103* 84 104* 73  BUN 14 13 12 9 8 7  5*  CREATININE 1.04 1.13 1.15 1.12 1.11 1.26* 1.22  CALCIUM 8.4* 8.1* 8.1* 7.9* 7.9* 8.0* 8.1*  MG 1.6* 2.2 2.1 2.0  --  1.9  --   AST 19 15 17 17 26  34 46*  ALT 44  29 24 21 24 30  44  ALKPHOS 55 51 63 52 48 45 51  BILITOT 0.4 0.5 0.5 0.4 0.5 0.2* 0.3   ------------------------------------------------------------------------------------------------------------------ Recent Labs    03/08/18 0556  CHOL 86  HDL 23*  LDLCALC 43  TRIG 99  CHOLHDL 3.7    Lab Results  Component Value Date   HGBA1C 7.5 (H) 03/08/2018   ------------------------------------------------------------------------------------------------------------------ No results for input(s): TSH, T4TOTAL, T3FREE, THYROIDAB in the last 72 hours.  Invalid input(s): FREET3 ------------------------------------------------------------------------------------------------------------------  Recent Labs    03/07/18 0532  VITAMINB12 316  FOLATE 14.5  FERRITIN 96  TIBC 175*  IRON 48  RETICCTPCT 1.8    Coagulation profile No results for input(s): INR, PROTIME in the last 168 hours.  No results for input(s): DDIMER in the last 72 hours.  Cardiac Enzymes No results for input(s): CKMB, TROPONINI, MYOGLOBIN in the last 168 hours.  Invalid input(s): CK ------------------------------------------------------------------------------------------------------------------    Component Value Date/Time   BNP 22.4 02/26/2016 1650    Micro Results Recent Results (from the past 240 hour(s))  Culture, Urine     Status: None   Collection Time: 03/02/18  9:46 AM  Result Value Ref Range Status   Specimen Description URINE, RANDOM  Final   Special Requests NONE  Final   Culture   Final    NO GROWTH Performed at [facility], [address], [place], [place] [address]    Report Status 03/04/2018 FINAL  Final  Culture, blood (routine x 2)     Status: None   Collection Time: 03/02/18  5:22 PM  Result Value Ref Range Status   Specimen Description BLOOD LEFT ANTECUBITAL  Final   Special Requests   Final    BOTTLES DRAWN AEROBIC ONLY Blood Culture adequate volume   Culture   Final    NO  GROWTH 5 DAYS Performed at [facility], [place] [address], [place], [place] [address]    Report Status 03/07/2018 FINAL  Final  Culture, blood (routine x 2)     Status: None   Collection Time: 03/02/18  5:30 PM  Result Value Ref Range Status   Specimen Description BLOOD LEFT HAND  Final   Special Requests   Final    BOTTLES DRAWN AEROBIC ONLY Blood Culture adequate volume   Culture   Final    NO GROWTH 5 DAYS Performed at [facility], [place] [address] [place], [place] [address]    Report Status 03/07/2018 FINAL  Final    Radiology Reports  Dg Chest 2 View  Result Date: 03/02/2018 CLINICAL DATA:  Weakness, sepsis EXAM: CHEST - 2 VIEW COMPARISON:  02/08/2018, 01/24/2018, CT 01/24/2018 FINDINGS: Patchy left lower lobe opacity. No pleural effusion. Stable cardiomediastinal silhouette. No pneumothorax. IMPRESSION: Low lung volumes. Patchy airspace disease in the left lower lobe may reflect atelectasis or mild pneumonia. Electronically Signed   By: Donavan Foil M.D.   On: 03/02/2018 19:39   Dg Chest 2 View  Result Date: 02/08/2018 CLINICAL DATA:  Chest pain EXAM: CHEST - 2 VIEW COMPARISON:  01/24/2018 chest radiograph. FINDINGS: Stable cardiomediastinal silhouette with normal heart size. No pneumothorax. No pleural effusion. Lungs appear clear, with no acute consolidative airspace disease and no pulmonary edema. IMPRESSION: No active cardiopulmonary disease. Electronically Signed   By: Ilona Sorrel M.D.   On: 02/08/2018 14:00   Ct Abdomen W Contrast  Result Date: 03/06/2018 CLINICAL DATA:  FOLLOWUP GASTRIC WALL THICKENING. EXAM: CT ABDOMEN WITH CONTRAST TECHNIQUE: Multidetector CT imaging of the abdomen was performed using the standard protocol following bolus administration of intravenous contrast. CONTRAST:  131mL OMNIPAQUE IOHEXOL 300 MG/ML  SOLN COMPARISON:  CT scan 03/02/2018 FINDINGS: Lower chest: Small bilateral pleural effusions with overlying atelectasis, left greater than  right. The heart is normal in size. No pericardial effusion. The distal esophagus is grossly normal. Hepatobiliary: No focal hepatic lesions or intrahepatic biliary dilatation. The gallbladder is normal. No common bile duct dilatation. Pancreas: No mass, inflammation or ductal dilatation.  Spleen: Normal size.  No focal lesions. Adrenals/Urinary Tract: The adrenal glands and kidneys are unremarkable. No renal lesions or hydronephrosis. Stomach/Bowel: Much improved CT appearance of the stomach. The marked gastric wall thickening is vastly improved. There is some residual mild wall thickening and some surrounding interstitial edema but no gastric ulcer or gastric outlet obstruction. The duodenum, small bowel and colon are unremarkable. No acute inflammatory changes, mass lesions or obstructive findings. Vascular/Lymphatic: The aorta and branch vessels are patent. The major venous structures are patent. No mesenteric or retroperitoneal mass or adenopathy. Small scattered lymph nodes are stable. Other: No ascites or abdominal wall hernia. Mild subcutaneous edema is noted. Musculoskeletal: No significant bony findings. IMPRESSION: 1. Marked improved CT appearance of the stomach when compared to the prior study from 03/02/2018. No gastric ulcer, mass or outlet obstruction. 2. Small bilateral pleural effusions with overlying atelectasis, left greater than right. 3. No free air or evidence of oral contrast extravasation. Electronically Signed   By: Marijo Sanes M.D.   On: 03/06/2018 12:57   Ct Abdomen Pelvis W Contrast  Result Date: 03/02/2018 CLINICAL DATA:  Abdominal pain. EXAM: CT ABDOMEN AND PELVIS WITH CONTRAST TECHNIQUE: Multidetector CT imaging of the abdomen and pelvis was performed using the standard protocol following bolus administration of intravenous contrast. CONTRAST:  141mL OMNIPAQUE IOHEXOL 300 MG/ML  SOLN COMPARISON:  CT chest 01/24/2018. FINDINGS: Lower chest: Small left pleural effusion. Subsegmental  atelectasis noted in the left lower lobe. Hepatobiliary: No focal liver abnormality is seen. No gallstones, gallbladder wall thickening, or biliary dilatation. Pancreas: Unremarkable. No pancreatic ductal dilatation or surrounding inflammatory changes. Spleen: Normal in size without focal abnormality. Adrenals/Urinary Tract: Normal adrenal glands. Bilateral perinephric fat stranding identified. No mass or hydronephrosis identified. Urinary bladder appears normal. Stomach/Bowel: The stomach is diffusely abnormal in appearance with marked diffuse gastric wall edema and Peri cast strict fat stranding. Areas of intramural ulceration containing gas is identified along the lesser curvature of the gastric fundus and gastric cardia, image 58/6 and image 72/6. No evidence for gastric perforation identified. No significant wall thickening involving the duodenum. No abnormal small bowel wall thickening or inflammation. No pathologic dilatation of the colon. Vascular/Lymphatic: Aortic atherosclerosis. No aneurysm. No upper abdominal adenopathy identified. No pelvic or inguinal adenopathy. Reproductive: Prostate gland and seminal vesicles are unremarkable. Other: No significant free fluid or fluid collections. Musculoskeletal: No acute or significant osseous findings. IMPRESSION: 1. Marked diffuse gastric wall edema is identified with areas of intramural ulceration in the fundus and gastric cardia. Findings compatible with gastritis. No evidence for perforation. No pneumoperitoneum identified. 2. Mild bilateral perinephric fat stranding identified without hydronephrosis, nonspecific but may be related to prior insult. 3.  Aortic Atherosclerosis (ICD10-I70.0). 4. Small left pleural effusion and left lower lobe subsegmental atelectasis. Electronically Signed   By: Kerby Moors M.D.   On: 03/02/2018 13:24   Mr Foot Right W Wo Contrast  Result Date: 02/21/2018 CLINICAL DATA:  Open wound of the right foot on the first digit.  Prior surgeries. Diabetes. EXAM: MRI OF THE RIGHT FOREFOOT WITHOUT AND WITH CONTRAST TECHNIQUE: Multiplanar, multisequence MR imaging of the right forefoot was performed before and after the administration of intravenous contrast. CONTRAST:  8 cc Gadavist COMPARISON:  Multiple exams, including 01/12/2018 and radiographs of 02/18/2018 FINDINGS: Bones/Joint/Cartilage Low-grade but abnormal edema and enhancement in the phalanges of the great toe; head and shaft of the first metatarsal; and in the head and distal shaft of the second metatarsal suspicious for early osteomyelitis.  The first digit sesamoids do not appear to abnormally enhance. Trace effusion of the first MTP joint dorsally. Ligaments Today's exam focused on the distal forefoot, and the level of the Lisfranc ligament was omitted. Muscles and Tendons Abnormal edema signal and enhancement especially in the abductor hallucis muscle compatible with myositis. There is a lesser degree of abnormal muscular edema and enhancement in the interosseous muscles and remaining plantar musculature of the foot, some of which may be neurogenic. Soft tissues Deep ulceration of the medial aspect of the great toe extending to the periosteal margin of the base of the distal phalanx as shown on image 26/11. Additional ulceration medial to the first MTP joint with surrounding subcutaneous edema and inflammatory stranding compatible with cellulitis which extends into the great toe. Questionable foreign body along the dorsum of the foot on image 6/11, dorsal to the second metatarsal shaft, with some surrounding edema and enhancement. This could also represent a small locule of gas. IMPRESSION: 1. Deep ulceration of the medial great toe extending to the base of the distal phalanx periosteal margin. There is abnormal edema and enhancement in the phalanges of the great toe; in the head and shaft of the first metatarsal; and in the head and distal shaft of the second metatarsal  suspicious for early osteomyelitis. 2. 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 3. Cellulitis of the great toe and medial distal foot. 4. Shallow ulceration medial to the first MTP joint. 5. Low signal intensity structure potentially a small foreign body or locule of gas in the dorsal subcutaneous tissues of the foot on image 6/6, dorsal to the level of the second metatarsal, not readily visible on conventional radiography. Electronically Signed   By: Van Clines M.D.   On: 02/21/2018 13:54   Dg Abd Portable 1v  Result Date: 03/04/2018 CLINICAL DATA:  Central abdominal pain for several days EXAM: PORTABLE ABDOMEN - 1 VIEW COMPARISON:  None. FINDINGS: Scattered large and small bowel gas is noted. No obstructive changes are seen. No free air is noted. Vas deferens calcifications are seen. No acute bony abnormality is noted. IMPRESSION: No acute abnormality noted. Electronically Signed   By: Inez Catalina M.D.   On: 03/04/2018 08:45   Dg Foot Complete Right  Result Date: 02/18/2018 CLINICAL DATA:  Diabetic patient with a wound on the right great toe. EXAM: RIGHT FOOT COMPLETE - 3+ VIEW COMPARISON:  Plain films right foot 02/08/2018. MRI right foot 01/12/2018. FINDINGS: Skin wound is seen along the medial aspect of the first MTP joint. Soft tissues of the foot are swollen. No soft tissue gas or radiopaque foreign body. No bony destructive change or periosteal reaction. IMPRESSION: Skin ulceration soft tissue swelling without evidence of osteomyelitis. Electronically Signed   By: Inge Rise M.D.   On: 02/18/2018 11:14   Dg Foot Complete Right  Result Date: 02/08/2018 CLINICAL DATA:  Poor healing surgical wounds to right foot. History of diabetes. EXAM: RIGHT FOOT COMPLETE - 3+ VIEW COMPARISON:  None. FINDINGS: No fractures. No evidence of osteomyelitis. Vascular calcifications are noted. IMPRESSION: No evidence of  osteomyelitis. Electronically Signed   By: Dorise Bullion III M.D   On: 02/08/2018 15:14    Time Spent in minutes  30   Lala Lund M.D on 03/09/2018 at 2:17 PM  To page go to [email or web address] - password [facility]

## 2018-03-09 NOTE — Care Management Note (Addendum)
Case Management Note  Patient Details  Name: Shane [name] MRN: [id number] Date of Birth: 07/02/1970  Subjective/Objective:      Severe gastritis.Hx of DM; CVA; Jehovah's witness; chronic diabetic foot ulcer s/p I&D on 8/7,  R foot cellulitis, s/p first ray amputation 9/13; lower GI bleed; HTN; HLD; bipolar; and chronic diastolic CHF. Resides with wife, [name].      [name] (Spouse)     [phone]      PCP:  Dr. Charlane Ferretti  Action/Plan: Transition to home when medically stable with the resumption of home health services.  Expected Discharge Date:                  Expected Discharge Plan:  [place]  In-House Referral:  NA  Discharge planning Services  CM Consult  Post Acute Care Choice:  Resumption of Svcs/PTA Provider, Home Health Choice offered to:  Patient  DME Arranged:   N/A DME Agency:   N/A  HH Arranged:  RN, PT ,OT HH Agency:  [facility](resumption of care with home health services with [facility])  Status of Service:  Completed, signed off  If discussed at [place] of Stay Meetings, dates discussed:    Additional Comments:  9/24 - NCM made [facility] liaison aware of d/c plan for today. Whitman Hero [place], RN 03/09/2018, 3:59 PM

## 2018-03-09 NOTE — Progress Notes (Signed)
Echocardiogram 2D Echocardiogram has been performed.  Shane [name] 03/09/2018, 2:07 PM

## 2018-03-09 NOTE — Progress Notes (Signed)
Physical Therapy Treatment Patient Details Name: Shane [name] MRN: [id number] DOB: 1970/12/04 Today's Date: 03/09/2018    History of Present Illness Patient is a 47 y/o male presenting to the ED on 03/02/18 with primary complaints of abdominal pain. Of note, recent hospitalization for R first ray amputation. CT with marked gastritis with intramural ulceration. PMH significant for DM; CVA; Jehovah's witness; chronic diabetic foot ulcer s/p I&D on 8/7, recently completed 1 month of doxycycline, with subsequent R foot cellulitis requiring hospitalization through 9/8 and first ray amputation 9/13; lower GI bleed; HTN; HLD; bipolar; and chronic diastolic CHF.    PT Comments    Pt seated edge of bed on arrival and assisted to standing for progression of mobility.  Pt able to stand and progress to gt training with good maintenance of weight bearing precautions.  Post gait training patient performed supine and seated exercises with noticeable fatigue post session.     Follow Up Recommendations  Home health PT;Supervision/Assistance - 24 hour     Equipment Recommendations  None recommended by PT    Recommendations for Other Services       Precautions / Restrictions Precautions Precautions: Fall Precaution Comments: of note, pt found in floor (9/21), fall mat should be at patient's bed side.   Restrictions Weight Bearing Restrictions: Yes RLE Weight Bearing: Non weight bearing    Mobility  Bed Mobility Overal bed mobility: Needs Assistance Bed Mobility: Sit to Supine       Sit to supine: Modified independent (Device/Increase time)   General bed mobility comments: Pt seated edge of bed on arrival.    Transfers Overall transfer level: Needs assistance Equipment used: Rolling walker (2 wheeled) Transfers: Sit to/from Stand Sit to Stand: Min assist Stand pivot transfers: Min assist       General transfer comment: Cues for hand placement to and from seated surface.  Cues to back RW  entirely to edge of bed before attempting to sit.    Ambulation/Gait Ambulation/Gait assistance: Min guard Gait Distance (Feet): 20 Feet Assistive device: Rolling walker (2 wheeled) Gait Pattern/deviations: Step-to pattern;Trunk flexed;Decreased stride length(hop to gait pattern.) Gait velocity: Decreased   General Gait Details: Cues for upper trunk control, RW safety and progression of steps forward with maintaining weight bearing status.  Pt fatigues quickly with gt training.     Stairs             Wheelchair Mobility    Modified Rankin (Stroke Patients Only)       Balance Overall balance assessment: Needs assistance;History of Falls Sitting-balance support: No upper extremity supported Sitting balance-Leahy Scale: Good       Standing balance-Leahy Scale: Poor Standing balance comment: Reliant on BUE support                            Cognition Arousal/Alertness: Awake/alert Behavior During Therapy: WFL for tasks assessed/performed Overall Cognitive Status: Within Functional Limits for tasks assessed                                        Exercises General Exercises - Lower Extremity Quad Sets: AROM;Both;10 reps;Supine Long Arc Quad: AROM;Both;10 reps;Supine Hip ABduction/ADduction: AROM;Both;10 reps;Supine Straight Leg Raises: AROM;Both;10 reps;Supine Hip Flexion/Marching: AROM;Both;10 reps;Supine    General Comments        Pertinent Vitals/Pain Pain Assessment: 0-10 Pain Score: 3  Pain Location:  R foot Pain Descriptors / Indicators: Aching Pain Intervention(s): Monitored during session;Repositioned    Home Living                      Prior Function            PT Goals (current goals can now be found in the care plan section) Acute Rehab PT Goals Patient Stated Goal: go home Potential to Achieve Goals: Good Progress towards PT goals: Progressing toward goals    Frequency    Min 3X/week      PT  Plan Current plan remains appropriate    Co-evaluation              AM-PAC PT "6 Clicks" Daily Activity  Outcome Measure  Difficulty turning over in bed (including adjusting bedclothes, sheets and blankets)?: A Little Difficulty moving from lying on back to sitting on the side of the bed? : A Little Difficulty sitting down on and standing up from a chair with arms (e.g., wheelchair, bedside commode, etc,.)?: Unable Help needed moving to and from a bed to chair (including a wheelchair)?: A Little Help needed walking in hospital room?: Total Help needed climbing 3-5 steps with a railing? : Total 6 Click Score: 12    End of Session Equipment Utilized During Treatment: Gait belt Activity Tolerance: Patient limited by fatigue;Patient limited by pain Patient left: in bed;with call bell/phone within reach;with bed alarm set;with nursing/sitter in room Nurse Communication: Mobility status PT Visit Diagnosis: Unsteadiness on feet (R26.81);Other abnormalities of gait and mobility (R26.89);Pain Pain - Right/Left: Right Pain - part of body: Ankle and joints of foot     Time: 1657-1716 PT Time Calculation (min) (ACUTE ONLY): 19 min  Charges:  $Therapeutic Activity: 8-22 mins                     Governor Rooks, PTA Acute Rehabilitation Services Pager [phone] Office [phone]     Aydien Majette Eli Hose 03/09/2018, 5:33 PM

## 2018-03-10 ENCOUNTER — Encounter: Payer: Self-pay | Admitting: Internal Medicine

## 2018-03-10 DIAGNOSIS — Z23 Encounter for immunization: Secondary | ICD-10-CM | POA: Diagnosis not present

## 2018-03-10 DIAGNOSIS — R1010 Upper abdominal pain, unspecified: Secondary | ICD-10-CM

## 2018-03-10 LAB — COMPREHENSIVE METABOLIC PANEL
ALBUMIN: 2.3 g/dL — AB (ref 3.5–5.0)
ALT: 74 U/L — AB (ref 0–44)
ANION GAP: 6 (ref 5–15)
AST: 67 U/L — ABNORMAL HIGH (ref 15–41)
Alkaline Phosphatase: 56 U/L (ref 38–126)
BILIRUBIN TOTAL: 0.3 mg/dL (ref 0.3–1.2)
BUN: 9 mg/dL (ref 6–20)
CO2: 29 mmol/L (ref 22–32)
CREATININE: 1.37 mg/dL — AB (ref 0.61–1.24)
Calcium: 8.2 mg/dL — ABNORMAL LOW (ref 8.9–10.3)
Chloride: 104 mmol/L (ref 98–111)
GFR calc non Af Amer: 60 mL/min — ABNORMAL LOW (ref >60)
GLUCOSE: 116 mg/dL — AB (ref 70–99)
Potassium: 4.1 mmol/L (ref 3.5–5.1)
Sodium: 139 mmol/L (ref 135–145)
TOTAL PROTEIN: 5.1 g/dL — AB (ref 6.5–8.1)

## 2018-03-10 LAB — GLUCOSE, CAPILLARY: Glucose-Capillary: 106 mg/dL — ABNORMAL HIGH (ref 70–99)

## 2018-03-10 MED ORDER — ATORVASTATIN CALCIUM 80 MG PO TABS: 80.0000 mg | ORAL_TABLET | Freq: Every morning | ORAL | 0 refills | Status: AC

## 2018-03-10 MED ORDER — PANTOPRAZOLE SODIUM 40 MG PO TBEC: 40.0000 mg | DELAYED_RELEASE_TABLET | Freq: Every day | ORAL | 0 refills | Status: AC

## 2018-03-10 MED FILL — PANTOPRAZOLE SOD DR 40 MG T: 40 | 30 days supply | Qty: 30 | Fill #1

## 2018-03-10 MED FILL — carBAMazepine 200 MG TABS: 200 | 30 days supply | Qty: 30 | Fill #1

## 2018-03-10 MED FILL — GABAPENTIN 300 MG CAPSULE: 300 | 30 days supply | Qty: 60 | Fill #0

## 2018-03-10 MED FILL — TAMSULOSIN HCL 0.4 MG CAP: 0.4 | 30 days supply | Qty: 30 | Fill #0

## 2018-03-10 MED FILL — LISINOPRIL 10 MG TABS: 10 | 30 days supply | Qty: 30 | Fill #0

## 2018-03-10 MED FILL — FLUoxetine HCL 40 MG CAPS: 40 | 30 days supply | Qty: 30 | Fill #4

## 2018-03-10 NOTE — Progress Notes (Signed)
Checked with CM and patient is ready for discharge. Patient says he will not have a ride till after lunch.

## 2018-03-10 NOTE — Progress Notes (Signed)
Physical Therapy Treatment & Discharge Patient Details Name: Shane [name] MRN: [id number] DOB: 1970/07/20 Today's Date: 03/10/2018    History of Present Illness Pt is a 47 y.o. male admitted 03/02/18 with c/o abdominal pain; CT with marked gastritis with intramural ulceration. PMH includes DM, CVA, HTN, bipolar, CHF; pt is Jehovah's witness. Of note, pt with chronic diabetic foot ulcer s/p first ray amputation on 9/13.   PT Comments    Pt has progressed well with mobility. Ambulating short distances with RW at supervision-level; good ability to maintain RLE NWB precautions with transfers and amb. Pt reports plans to primarily use w/c upon return home; increased time spent discussing risks of immobility sitting in w/c, and importance of BLE therex and frequent mobility/amb. Pt will have necessary support from wife at d/c. Pt has met short-term acute PT goals; has no further questions or concerns. Will d/c acute PT.    Follow Up Recommendations  Home health PT;Supervision for mobility/OOB     Equipment Recommendations  None recommended by PT    Recommendations for Other Services       Precautions / Restrictions Precautions Precautions: Fall Restrictions Weight Bearing Restrictions: Yes RLE Weight Bearing: Non weight bearing    Mobility  Bed Mobility Overal bed mobility: Independent                Transfers Overall transfer level: Modified independent Equipment used: Rolling walker (2 wheeled) Transfers: Sit to/from Stand              Ambulation/Gait Ambulation/Gait assistance: Supervision Gait Distance (Feet): 40 Feet Assistive device: Rolling walker (2 wheeled)   Gait velocity: Decreased   General Gait Details: Good technique with hop-to gait pattern on RW to maintain RLE NWB precautions. Pt's BUEs easily fatigued requiring 1x standing rest break; encouraged activity pacing   Stairs             Wheelchair Mobility    Modified Rankin (Stroke  Patients Only)       Balance Overall balance assessment: Needs assistance;History of Falls Sitting-balance support: No upper extremity supported Sitting balance-Leahy Scale: Good     Standing balance support: Bilateral upper extremity supported Standing balance-Leahy Scale: Poor Standing balance comment: Reliant on BUE support to maintain RLE NWB precautions while standing                            Cognition Arousal/Alertness: Awake/alert Behavior During Therapy: WFL for tasks assessed/performed Overall Cognitive Status: Within Functional Limits for tasks assessed                                        Exercises      General Comments General comments (skin integrity, edema, etc.): Pt reports he plans to primarily use w/c at home- increased time spent discussing importance of BLE therex and frequent mobilization/ambulation to prevent weakness, contractures, etc.      Pertinent Vitals/Pain Pain Assessment: 0-10 Pain Score: 6  Pain Location: R foot Pain Descriptors / Indicators: Aching Pain Intervention(s): Monitored during session;Limited activity within patient's tolerance    Home Living                      Prior Function            PT Goals (current goals can now be found in the care plan section) Acute  Rehab PT Goals Patient Stated Goal: go home PT Goal Formulation: With patient Time For Goal Achievement: 03/19/18 Potential to Achieve Goals: Good Progress towards PT goals: Progressing toward goals    Frequency    Min 3X/week      PT Plan Current plan remains appropriate    Co-evaluation              AM-PAC PT "6 Clicks" Daily Activity  Outcome Measure  Difficulty turning over in bed (including adjusting bedclothes, sheets and blankets)?: None Difficulty moving from lying on back to sitting on the side of the bed? : None Difficulty sitting down on and standing up from a chair with arms (e.g., wheelchair,  bedside commode, etc,.)?: A Little Help needed moving to and from a bed to chair (including a wheelchair)?: A Little Help needed walking in hospital room?: A Little Help needed climbing 3-5 steps with a railing? : Total 6 Click Score: 18    End of Session Equipment Utilized During Treatment: Gait belt Activity Tolerance: Patient tolerated treatment well Patient left: in bed;with call bell/phone within reach;Other (comment)(seated EOB to don clothes for d/c; fall pad placed) Nurse Communication: Mobility status PT Visit Diagnosis: Unsteadiness on feet (R26.81);Other abnormalities of gait and mobility (R26.89);Pain Pain - Right/Left: Right Pain - part of body: Ankle and joints of foot     Time: 6484-7207 PT Time Calculation (min) (ACUTE ONLY): 10 min  Charges:  $Gait Training: 8-22 mins                    Mabeline Caras, PT, DPT Acute Rehabilitation Services  Pager [phone] Office [place] 03/10/2018, 11:54 AM

## 2018-03-10 NOTE — Discharge Instructions (Signed)
Follow with Primary MD Charlott Rakes, MD in 7 days   Get CBC, CMP checked  by Primary MD  in 5-7 days   Activity: As tolerated with Full fall precautions use walker/cane & assistance as needed, no weightbearing right foot  Disposition Home   Diet: Heart Healthy Low Carb, check CBGs QA CHS  For Heart failure patients - Check your Weight same time everyday, if you gain over 2 pounds, or you develop in leg swelling, experience more shortness of breath or chest pain, call your Primary MD immediately. Follow Cardiac Low Salt Diet and 1.5 lit/day fluid restriction.  Special Instructions: If you have smoked or chewed Tobacco  in the last 2 yrs please stop smoking, stop any regular Alcohol  and or any Recreational drug use.  On your next visit with your primary care physician please Get Medicines reviewed and adjusted.  Please request your Prim.MD to go over all Hospital Tests and Procedure/Radiological results at the follow up, please get all Hospital records sent to your Prim MD by signing hospital release before you go home.  If you experience worsening of your admission symptoms, develop shortness of breath, life threatening emergency, suicidal or homicidal thoughts you must seek medical attention immediately by calling 911 or calling your MD immediately  if symptoms less severe.  You Must read complete instructions/literature along with all the possible adverse reactions/side effects for all the Medicines you take and that have been prescribed to you. Take any new Medicines after you have completely understood and accpet all the possible adverse reactions/side effects.

## 2018-03-10 NOTE — Progress Notes (Signed)
Call patient and let him know path is consistent with ischemic gastritis  As I had explained temporary blood flow interruption thought from embolic event during/after toe amputation  Hope he is improved  Needs f/u APP or Dr. Havery Moros in 2 weeks-3 weeks  Amy has seen him 1x in hospital so she has familiarity if available

## 2018-03-10 NOTE — Progress Notes (Signed)
Explained discharge orders and instructions to patient. Patient expressed understanding. Removed IV with no complications. Patient belonging with patient. Patient dressed and awaiting family transportation.

## 2018-03-10 NOTE — Discharge Summary (Signed)
Shane [name] CWU:[id number] DOB: 31-Dec-1970 DOA: 03/02/2018  PCP: Charlott Rakes, MD  Admit date: 03/02/2018  Discharge date: 03/10/2018  Admitted From: Home   Disposition:  Home   Recommendations for Outpatient Follow-up:   Follow up with PCP in 1-2 weeks  PCP Please obtain BMP/CBC, 2 view CXR in 1week,  (see Discharge instructions)   PCP Please follow up on the following pending results:    Home Health: PT,OT,RN   Equipment/Devices: None  Consultations: GI, CCS, Ortho Discharge Condition: Stable   CODE STATUS: [place]   Diet Recommendation: Heart Healthy Low Carb   Chief Complaint  Patient presents with  . Abdominal Pain     Brief history of present illness from the day of admission and additional interim summary    Shane Romerois a 47 y.o.malewith medical history significant ofDM; CVA; Jehovah's witness; chronic diabetic foot ulcer s/p I&D on 8/7, recently completed 1 month of doxycycline, with subsequent R foot cellulitis requiring hospitalization through 9/8 and first ray amputation 9/13; lower GI bleed; HTN; HLD; bipolar; and chronic diastolic CHF presenting with abdominal pain found to have severe gastritis on CT scan.                                                                  Hospital Course    1. Severe gastritis.  Had CT evidence of gastritis, his symptoms continued hence GI conducted an EGD on 03/07/2018 showing changes consistent with embolic ischemic injury.  With supportive care symptoms have improved on 03/08/2018, currently on aspirin and statin for secondary prevention.  He had stable LDL and will be continued on statin, A1c was higher than desirable and he will follow with PCP for better glycemic control, stable echocardiogram without any embolic source. Today he feels better is  symptom-free, tolerating diet without any problems, eager to go home, continue PPI upon discharge and have him follow with GI outpatient post discharge.  2.  Recent right diabetic foot gangrene requiring surgery by Dr. Sharol Given last week.  Continue local wound care, no weightbearing in the right foot, Dr. Sharol Given evaluated the patient on 03/04/2018 here.  3.  DM type II.    Resume home regimen and follow with PCP for outpatient monitoring and control.   4.  Dyslipidemia.  On statin continue.   5.  Essential hypertension.  Currently on combination of Norvasc, Coreg, have added oral scheduled hydralazine for better control along with PRN hydralazine.  Monitor and adjust.   6.  BPH.  On Flomax continue.   7.  Chronic diastolic CHF.  EF 60% on echocardiogram done in February 2018.  Currently compensated.  Lab Results  Component Value Date   HGBA1C 7.5 (H) 03/08/2018   Lab Results  Component Value Date   CHOL 86 03/08/2018   HDL  23 (L) 03/08/2018   LDLCALC 43 03/08/2018   TRIG 99 03/08/2018   CHOLHDL 3.7 03/08/2018    Discharge diagnosis     Principal Problem:   Sepsis ([place]) Active Problems:   HLD (hyperlipidemia)   Essential hypertension   Diabetes mellitus type 2, uncontrolled, with complications (HCC)   Chronic diastolic CHF (congestive heart failure), NYHA class 1 (HCC)   CKD (chronic kidney disease) stage 3, GFR 30-59 ml/min (HCC)   Abdominal pain   Gastritis   Epigastric pain   Abnormal CT scan, stomach   Ischemic disease of gut (HCC)   Acute gastric ulcer without hemorrhage or perforation    Discharge instructions    Discharge Instructions    Discharge instructions   Complete by:  As directed    Follow with Primary MD Charlott Rakes, MD in 7 days   Get CBC, CMP checked  by Primary MD  in 5-7 days   Activity: As tolerated with Full fall precautions use walker/cane & assistance as needed, no weightbearing right foot  Disposition Home   Diet: Heart Healthy  Low Carb, check CBGs QA CHS  For Heart failure patients - Check your Weight same time everyday, if you gain over 2 pounds, or you develop in leg swelling, experience more shortness of breath or chest pain, call your Primary MD immediately. Follow Cardiac Low Salt Diet and 1.5 lit/day fluid restriction.  Special Instructions: If you have smoked or chewed Tobacco  in the last 2 yrs please stop smoking, stop any regular Alcohol  and or any Recreational drug use.  On your next visit with your primary care physician please Get Medicines reviewed and adjusted.  Please request your Prim.MD to go over all Hospital Tests and Procedure/Radiological results at the follow up, please get all Hospital records sent to your Prim MD by signing hospital release before you go home.  If you experience worsening of your admission symptoms, develop shortness of breath, life threatening emergency, suicidal or homicidal thoughts you must seek medical attention immediately by calling 911 or calling your MD immediately  if symptoms less severe.  You Must read complete instructions/literature along with all the possible adverse reactions/side effects for all the Medicines you take and that have been prescribed to you. Take any new Medicines after you have completely understood and accpet all the possible adverse reactions/side effects.   Increase activity slowly   Complete by:  As directed       Discharge Medications   Allergies as of 03/10/2018      Reactions   Other Diarrhea, Other (See Comments)   Red meat causes stomach pains, bloating and diarrhea   Lactose Intolerance (gi) Diarrhea, Other (See Comments)   Bloating       Medication List    TAKE these medications   ACCU-CHEK AVIVA PLUS w/Device Kit Use as directed three times daily   ACCU-CHEK FASTCLIX LANCETS Misc Use as directed three times daily   acetaminophen 500 MG tablet Commonly known as:  TYLENOL Take 500 mg by mouth every 6 (six) hours as  needed for headache (pain).   amLODipine 5 MG tablet Commonly known as:  NORVASC Take 1 tablet (5 mg total) by mouth daily.   aspirin EC 81 MG tablet Take 1 tablet (81 mg total) by mouth daily.   atorvastatin 80 MG tablet Commonly known as:  LIPITOR Take 1 tablet (80 mg total) by mouth every morning.   carbamazepine 200 MG tablet Commonly known as:  TEGRETOL TAKE  1 TABLET BY MOUTH AT BEDTIME.   carvedilol 25 MG tablet Commonly known as:  COREG Take 1 tablet (25 mg total) by mouth 2 (two) times daily with a meal. What changed:  when to take this   FLUoxetine 40 MG capsule Commonly known as:  PROZAC Take 1 capsule (40 mg total) by mouth daily.   gabapentin 300 MG capsule Commonly known as:  NEURONTIN Take 1 capsule (300 mg total) by mouth 2 (two) times daily. What changed:  when to take this   glipiZIDE 10 MG 24 hr tablet Commonly known as:  GLUCOTROL XL Take 1 tablet (10 mg total) by mouth daily with breakfast.   glucose blood test strip Use as instructed three times daily   HYDROcodone-acetaminophen 5-325 MG tablet Commonly known as:  NORCO/VICODIN Take 1 tablet by mouth every 4 (four) hours as needed for moderate pain.   Insulin Glargine 100 UNIT/ML Solostar Pen Commonly known as:  LANTUS Inject 35 Units into the skin 2 (two) times daily. What changed:    how much to take  when to take this   lisinopril 10 MG tablet Commonly known as:  PRINIVIL,ZESTRIL Take 1 tablet (10 mg total) by mouth daily.   methocarbamol 500 MG tablet Commonly known as:  ROBAXIN Take 1 tablet (500 mg total) by mouth every 6 (six) hours as needed for muscle spasms.   pantoprazole 40 MG tablet Commonly known as:  PROTONIX Take 1 tablet (40 mg total) by mouth daily.   polyethylene glycol packet Commonly known as:  MIRALAX / GLYCOLAX Take 17 g by mouth daily as needed for moderate constipation.   senna-docusate 8.6-50 MG tablet Commonly known as:  Senokot-S Take 1 tablet by  mouth at bedtime. What changed:    when to take this  reasons to take this   sucralfate 1 g tablet Commonly known as:  CARAFATE TAKE 1 TABLET (1 G TOTAL) BY MOUTH 4 (FOUR) TIMES DAILY - WITH MEALS AND AT BEDTIME. What changed:  when to take this   tamsulosin 0.4 MG Caps capsule Commonly known as:  FLOMAX Take 1 capsule (0.4 mg total) by mouth daily.   traZODone 100 MG tablet Commonly known as:  DESYREL Take 1 tablet (100 mg total) by mouth at bedtime.       Follow-up Information    Newt Minion, MD Follow up in 1 week(s).   Specialty:  Orthopedic Surgery Contact information: [place] [address] [phone]        Gatha Mayer, MD Follow up.   Specialty:  Gastroenterology Why:  Will notify pathology results and arrange follow-up with Dr. Havery Moros - primary GI MD Contact information: [address] [place] [address] [phone]        [facility] Follow up.   Specialty:  [place] Why:  Resumption of home health services arranged Contact information: [place] [address] [phone]        Charlott Rakes, MD. Schedule an appointment as soon as possible for a visit in 1 week(s).   Specialty:  Family Medicine Contact information: [place] [address] [phone]           Major procedures and Radiology Reports - PLEASE review detailed and final reports thoroughly  -       TTE - Left ventricle: The cavity size was normal. Wall thickness was normal. Systolic function was normal. The estimated ejection fraction was  in the range of 60% to 65%. Wall motion was normal; there were no regional wall motion abnormalities.  EGD- Congested, erythematous, friable (with contact bleeding), inflamed and ulcerated mucosa in the greater curvature of the gastric body and posterior wall of the gastric antrum. Biopsied. THIS DID NOT INVOLVE ENTIRE  STOMACH AS CT INDICATED - IT LOOKS LIKE ISCHEMIC INJURYT SO I DO THINK HE HAD A PERIOPERATIVE (AMPUTATION) EMBOLIC ISCHEMIC EVENT TO A BRANCH OF THE GASTRIC BLOOD SUPPLY - The examination was otherwise normal.  CT - 1. Marked diffuse gastric wall edema is identified with areas of intramural ulceration in the fundus and gastric cardia. Findings compatible with gastritis. No evidence for perforation. No pneumoperitoneum identified. 2. Mild bilateral perinephric fat stranding identified without hydronephrosis, nonspecific but may be related to prior insult. 3. Aortic Atherosclerosis (ICD10-I70.0). 4. Small left pleural effusion and left lower lobe subsegmental atelectasis   Dg Chest 2 View  Result Date: 03/02/2018 CLINICAL DATA:  Weakness, sepsis EXAM: CHEST - 2 VIEW COMPARISON:  02/08/2018, 01/24/2018, CT 01/24/2018 FINDINGS: Patchy left lower lobe opacity. No pleural effusion. Stable cardiomediastinal silhouette. No pneumothorax. IMPRESSION: Low lung volumes. Patchy airspace disease in the left lower lobe may reflect atelectasis or mild pneumonia. Electronically Signed   By: Donavan Foil M.D.   On: 03/02/2018 19:39   Dg Chest 2 View  Result Date: 02/08/2018 CLINICAL DATA:  Chest pain EXAM: CHEST - 2 VIEW COMPARISON:  01/24/2018 chest radiograph. FINDINGS: Stable cardiomediastinal silhouette with normal heart size. No pneumothorax. No pleural effusion. Lungs appear clear, with no acute consolidative airspace disease and no pulmonary edema. IMPRESSION: No active cardiopulmonary disease. Electronically Signed   By: Ilona Sorrel M.D.   On: 02/08/2018 14:00   Ct Abdomen W Contrast  Result Date: 03/06/2018 CLINICAL DATA:  FOLLOWUP GASTRIC WALL THICKENING. EXAM: CT ABDOMEN WITH CONTRAST TECHNIQUE: Multidetector CT imaging of the abdomen was performed using the standard protocol following bolus administration of intravenous contrast. CONTRAST:  170m OMNIPAQUE IOHEXOL 300 MG/ML  SOLN COMPARISON:  CT scan  03/02/2018 FINDINGS: Lower chest: Small bilateral pleural effusions with overlying atelectasis, left greater than right. The heart is normal in size. No pericardial effusion. The distal esophagus is grossly normal. Hepatobiliary: No focal hepatic lesions or intrahepatic biliary dilatation. The gallbladder is normal. No common bile duct dilatation. Pancreas: No mass, inflammation or ductal dilatation. Spleen: Normal size.  No focal lesions. Adrenals/Urinary Tract: The adrenal glands and kidneys are unremarkable. No renal lesions or hydronephrosis. Stomach/Bowel: Much improved CT appearance of the stomach. The marked gastric wall thickening is vastly improved. There is some residual mild wall thickening and some surrounding interstitial edema but no gastric ulcer or gastric outlet obstruction. The duodenum, small bowel and colon are unremarkable. No acute inflammatory changes, mass lesions or obstructive findings. Vascular/Lymphatic: The aorta and branch vessels are patent. The major venous structures are patent. No mesenteric or retroperitoneal mass or adenopathy. Small scattered lymph nodes are stable. Other: No ascites or abdominal wall hernia. Mild subcutaneous edema is noted. Musculoskeletal: No significant bony findings. IMPRESSION: 1. Marked improved CT appearance of the stomach when compared to the prior study from 03/02/2018. No gastric ulcer, mass or outlet obstruction. 2. Small bilateral pleural effusions with overlying atelectasis, left greater than right. 3. No free air or evidence of oral contrast extravasation. Electronically Signed   By: PMarijo SanesM.D.   On: 03/06/2018 12:57   Ct Abdomen Pelvis W Contrast  Result Date: 03/02/2018 CLINICAL DATA:  Abdominal pain. EXAM:  CT ABDOMEN AND PELVIS WITH CONTRAST TECHNIQUE: Multidetector CT imaging of the abdomen and pelvis was performed using the standard protocol following bolus administration of intravenous contrast. CONTRAST:  162m OMNIPAQUE IOHEXOL  300 MG/ML  SOLN COMPARISON:  CT chest 01/24/2018. FINDINGS: Lower chest: Small left pleural effusion. Subsegmental atelectasis noted in the left lower lobe. Hepatobiliary: No focal liver abnormality is seen. No gallstones, gallbladder wall thickening, or biliary dilatation. Pancreas: Unremarkable. No pancreatic ductal dilatation or surrounding inflammatory changes. Spleen: Normal in size without focal abnormality. Adrenals/Urinary Tract: Normal adrenal glands. Bilateral perinephric fat stranding identified. No mass or hydronephrosis identified. Urinary bladder appears normal. Stomach/Bowel: The stomach is diffusely abnormal in appearance with marked diffuse gastric wall edema and Peri cast strict fat stranding. Areas of intramural ulceration containing gas is identified along the lesser curvature of the gastric fundus and gastric cardia, image 58/6 and image 72/6. No evidence for gastric perforation identified. No significant wall thickening involving the duodenum. No abnormal small bowel wall thickening or inflammation. No pathologic dilatation of the colon. Vascular/Lymphatic: Aortic atherosclerosis. No aneurysm. No upper abdominal adenopathy identified. No pelvic or inguinal adenopathy. Reproductive: Prostate gland and seminal vesicles are unremarkable. Other: No significant free fluid or fluid collections. Musculoskeletal: No acute or significant osseous findings. IMPRESSION: 1. Marked diffuse gastric wall edema is identified with areas of intramural ulceration in the fundus and gastric cardia. Findings compatible with gastritis. No evidence for perforation. No pneumoperitoneum identified. 2. Mild bilateral perinephric fat stranding identified without hydronephrosis, nonspecific but may be related to prior insult. 3.  Aortic Atherosclerosis (ICD10-I70.0). 4. Small left pleural effusion and left lower lobe subsegmental atelectasis. Electronically Signed   By: TKerby MoorsM.D.   On: 03/02/2018 13:24   Mr Foot  Right W Wo Contrast  Result Date: 02/21/2018 CLINICAL DATA:  Open wound of the right foot on the first digit. Prior surgeries. Diabetes. EXAM: MRI OF THE RIGHT FOREFOOT WITHOUT AND WITH CONTRAST TECHNIQUE: Multiplanar, multisequence MR imaging of the right forefoot was performed before and after the administration of intravenous contrast. CONTRAST:  8 cc Gadavist COMPARISON:  Multiple exams, including 01/12/2018 and radiographs of 02/18/2018 FINDINGS: Bones/Joint/Cartilage Low-grade but abnormal edema and enhancement in the phalanges of the great toe; head and shaft of the first metatarsal; and in the head and distal shaft of the second metatarsal suspicious for early osteomyelitis. The first digit sesamoids do not appear to abnormally enhance. Trace effusion of the first MTP joint dorsally. Ligaments Today's exam focused on the distal forefoot, and the level of the Lisfranc ligament was omitted. Muscles and Tendons Abnormal edema signal and enhancement especially in the abductor hallucis muscle compatible with myositis. There is a lesser degree of abnormal muscular edema and enhancement in the interosseous muscles and remaining plantar musculature of the foot, some of which may be neurogenic. Soft tissues Deep ulceration of the medial aspect of the great toe extending to the periosteal margin of the base of the distal phalanx as shown on image 26/11. Additional ulceration medial to the first MTP joint with surrounding subcutaneous edema and inflammatory stranding compatible with cellulitis which extends into the great toe. Questionable foreign body along the dorsum of the foot on image 6/11, dorsal to the second metatarsal shaft, with some surrounding edema and enhancement. This could also represent a small locule of gas. IMPRESSION: 1. Deep ulceration of the medial great toe extending to the base of the distal phalanx periosteal margin. There is abnormal edema and enhancement in the phalanges of  the great toe; in  the head and shaft of the first metatarsal; and in the head and distal shaft of the second metatarsal suspicious for early osteomyelitis. 2. 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 3. Cellulitis of the great toe and medial distal foot. 4. Shallow ulceration medial to the first MTP joint. 5. Low signal intensity structure potentially a small foreign body or locule of gas in the dorsal subcutaneous tissues of the foot on image 6/6, dorsal to the level of the second metatarsal, not readily visible on conventional radiography. Electronically Signed   By: Van Clines M.D.   On: 02/21/2018 13:54   Dg Abd Portable 1v  Result Date: 03/04/2018 CLINICAL DATA:  Central abdominal pain for several days EXAM: PORTABLE ABDOMEN - 1 VIEW COMPARISON:  None. FINDINGS: Scattered large and small bowel gas is noted. No obstructive changes are seen. No free air is noted. Vas deferens calcifications are seen. No acute bony abnormality is noted. IMPRESSION: No acute abnormality noted. Electronically Signed   By: Inez Catalina M.D.   On: 03/04/2018 08:45   Dg Foot Complete Right  Result Date: 02/18/2018 CLINICAL DATA:  Diabetic patient with a wound on the right great toe. EXAM: RIGHT FOOT COMPLETE - 3+ VIEW COMPARISON:  Plain films right foot 02/08/2018. MRI right foot 01/12/2018. FINDINGS: Skin wound is seen along the medial aspect of the first MTP joint. Soft tissues of the foot are swollen. No soft tissue gas or radiopaque foreign body. No bony destructive change or periosteal reaction. IMPRESSION: Skin ulceration soft tissue swelling without evidence of osteomyelitis. Electronically Signed   By: Inge Rise M.D.   On: 02/18/2018 11:14   Dg Foot Complete Right  Result Date: 02/08/2018 CLINICAL DATA:  Poor healing surgical wounds to right foot. History of diabetes. EXAM: RIGHT FOOT COMPLETE - 3+ VIEW COMPARISON:  None. FINDINGS: No fractures.  No evidence of osteomyelitis. Vascular calcifications are noted. IMPRESSION: No evidence of osteomyelitis. Electronically Signed   By: Dorise Bullion III M.D   On: 02/08/2018 15:14    Micro Results    Recent Results (from the past 240 hour(s))  Culture, Urine     Status: None   Collection Time: 03/02/18  9:46 AM  Result Value Ref Range Status   Specimen Description URINE, RANDOM  Final   Special Requests NONE  Final   Culture   Final    NO GROWTH Performed at [facility], [address], [place], [place] [address]    Report Status 03/04/2018 FINAL  Final  Culture, blood (routine x 2)     Status: None   Collection Time: 03/02/18  5:22 PM  Result Value Ref Range Status   Specimen Description BLOOD LEFT ANTECUBITAL  Final   Special Requests   Final    BOTTLES DRAWN AEROBIC ONLY Blood Culture adequate volume   Culture   Final    NO GROWTH 5 DAYS Performed at [facility], [place] [address], [place], [place] [address]    Report Status 03/07/2018 FINAL  Final  Culture, blood (routine x 2)     Status: None   Collection Time: 03/02/18  5:30 PM  Result Value Ref Range Status   Specimen Description BLOOD LEFT HAND  Final   Special Requests   Final    BOTTLES DRAWN AEROBIC ONLY Blood Culture adequate volume   Culture   Final    NO GROWTH 5 DAYS  Performed at [facility], [place] [address] [place], [place] [address]    Report Status 03/07/2018 FINAL  Final    Today   Subjective    Shane [name] today has no headache,no chest abdominal pain,no new weakness tingling or numbness, feels much better wants to go home today.     Objective   Blood pressure (!) 141/78, pulse 76, temperature 98.7 F (37.1 C), temperature source Oral, resp. rate 17, height _0  (1.702 m), weight 93.1 kg, SpO2 97 %.   Intake/Output Summary (Last 24 hours) at 03/10/2018 0816 Last data filed at 03/10/2018 0439 Gross per 24 hour  Intake 840 ml  Output 1830 ml  Net -990 ml     Exam  Awake Alert, Oriented x 3, No new F.N deficits, Normal affect [place].AT,PERRAL Supple Neck,No JVD, No cervical lymphadenopathy appriciated.  Symmetrical Chest wall movement, Good air movement bilaterally, CTAB RRR,No Gallops,Rubs or new Murmurs, No Parasternal Heave +ve B.Sounds, Abd Soft, Non tender, No organomegaly appriciated, No rebound -guarding or rigidity. No Cyanosis, Clubbing or edema, No new Rash or bruise   Data Review   CBC w Diff:  Lab Results  Component Value Date   WBC 9.3 03/08/2018   HGB 8.4 (L) 03/08/2018   HGB 10.6 (L) 01/20/2017   HGB 9.8 (L) 02/09/2016   HCT 26.7 (L) 03/08/2018   HCT 32.4 (L) 01/20/2017   HCT 30.7 (L) 02/09/2016   PLT 311 03/08/2018   PLT 301 01/20/2017   LYMPHOPCT 13 03/05/2018   LYMPHOPCT 25.1 02/09/2016   MONOPCT 6 03/05/2018   MONOPCT 5.3 02/09/2016   EOSPCT 2 03/05/2018   EOSPCT 3.5 02/09/2016   BASOPCT 0 03/05/2018   BASOPCT 0.7 02/09/2016    CMP:  Lab Results  Component Value Date   NA 139 03/10/2018   NA 141 11/20/2017   K 4.1 03/10/2018   CL 104 03/10/2018   CO2 29 03/10/2018   BUN 9 03/10/2018   BUN 25 (H) 11/20/2017   CREATININE 1.37 (H) 03/10/2018   CREATININE 1.79 (H) 08/02/2016   PROT 5.1 (L) 03/10/2018   PROT 6.4 11/20/2017   ALBUMIN 2.3 (L) 03/10/2018   ALBUMIN 3.8 11/20/2017   BILITOT 0.3 03/10/2018   BILITOT <0.2 11/20/2017   ALKPHOS 56 03/10/2018   AST 67 (H) 03/10/2018   ALT 74 (H) 03/10/2018  .   Total Time in preparing paper work, data evaluation and todays exam - 89 minutes  Lala Lund M.D on 03/10/2018 at [place]  [phone]

## 2018-03-13 DIAGNOSIS — L02611 Cutaneous abscess of right foot: Secondary | ICD-10-CM | POA: Diagnosis not present

## 2018-03-13 DIAGNOSIS — E785 Hyperlipidemia, unspecified: Secondary | ICD-10-CM | POA: Diagnosis not present

## 2018-03-13 DIAGNOSIS — E1140 Type 2 diabetes mellitus with diabetic neuropathy, unspecified: Secondary | ICD-10-CM | POA: Diagnosis not present

## 2018-03-13 DIAGNOSIS — K219 Gastro-esophageal reflux disease without esophagitis: Secondary | ICD-10-CM | POA: Diagnosis not present

## 2018-03-13 DIAGNOSIS — F419 Anxiety disorder, unspecified: Secondary | ICD-10-CM | POA: Diagnosis not present

## 2018-03-13 DIAGNOSIS — F319 Bipolar disorder, unspecified: Secondary | ICD-10-CM | POA: Diagnosis not present

## 2018-03-13 DIAGNOSIS — I5032 Chronic diastolic (congestive) heart failure: Secondary | ICD-10-CM | POA: Diagnosis not present

## 2018-03-13 DIAGNOSIS — I110 Hypertensive heart disease with heart failure: Secondary | ICD-10-CM | POA: Diagnosis not present

## 2018-03-13 DIAGNOSIS — D649 Anemia, unspecified: Secondary | ICD-10-CM | POA: Diagnosis not present

## 2018-03-14 DIAGNOSIS — I110 Hypertensive heart disease with heart failure: Secondary | ICD-10-CM | POA: Diagnosis not present

## 2018-03-14 DIAGNOSIS — D649 Anemia, unspecified: Secondary | ICD-10-CM | POA: Diagnosis not present

## 2018-03-14 DIAGNOSIS — K219 Gastro-esophageal reflux disease without esophagitis: Secondary | ICD-10-CM | POA: Diagnosis not present

## 2018-03-14 DIAGNOSIS — E785 Hyperlipidemia, unspecified: Secondary | ICD-10-CM | POA: Diagnosis not present

## 2018-03-14 DIAGNOSIS — F319 Bipolar disorder, unspecified: Secondary | ICD-10-CM | POA: Diagnosis not present

## 2018-03-14 DIAGNOSIS — E1140 Type 2 diabetes mellitus with diabetic neuropathy, unspecified: Secondary | ICD-10-CM | POA: Diagnosis not present

## 2018-03-14 DIAGNOSIS — F419 Anxiety disorder, unspecified: Secondary | ICD-10-CM | POA: Diagnosis not present

## 2018-03-14 DIAGNOSIS — L02611 Cutaneous abscess of right foot: Secondary | ICD-10-CM | POA: Diagnosis not present

## 2018-03-14 DIAGNOSIS — I5032 Chronic diastolic (congestive) heart failure: Secondary | ICD-10-CM | POA: Diagnosis not present

## 2018-03-16 ENCOUNTER — Ambulatory Visit (INDEPENDENT_AMBULATORY_CARE_PROVIDER_SITE_OTHER): Payer: Medicare HMO | Admitting: Physician Assistant

## 2018-03-16 ENCOUNTER — Encounter (INDEPENDENT_AMBULATORY_CARE_PROVIDER_SITE_OTHER): Payer: Self-pay | Admitting: Physician Assistant

## 2018-03-16 VITALS — Ht 67.0 in | Wt 205.2 lb

## 2018-03-16 DIAGNOSIS — N183 Chronic kidney disease, stage 3 unspecified: Secondary | ICD-10-CM

## 2018-03-16 DIAGNOSIS — E1142 Type 2 diabetes mellitus with diabetic polyneuropathy: Secondary | ICD-10-CM

## 2018-03-16 DIAGNOSIS — Z89411 Acquired absence of right great toe: Secondary | ICD-10-CM

## 2018-03-16 MED ORDER — MUPIROCIN 2 % EX OINT: 1.0000 "application " | TOPICAL_OINTMENT | Freq: Every day | CUTANEOUS | 0 refills | Status: AC

## 2018-03-16 MED FILL — glipiZIDE XL 10 MG TB24: 10 | 30 days supply | Qty: 30 | Fill #0

## 2018-03-17 ENCOUNTER — Telehealth (INDEPENDENT_AMBULATORY_CARE_PROVIDER_SITE_OTHER): Payer: Self-pay | Admitting: Orthopedic Surgery

## 2018-03-17 ENCOUNTER — Encounter (INDEPENDENT_AMBULATORY_CARE_PROVIDER_SITE_OTHER): Payer: Self-pay | Admitting: Physician Assistant

## 2018-03-17 NOTE — Telephone Encounter (Signed)
FYI

## 2018-03-17 NOTE — Telephone Encounter (Signed)
Patient said he is moving back to [place], since he still has stitches in he was going to try and find an orthopedic doctor there and get his records transferred. He cancelled his appt next week but just wanted to let Dr. Sharol Given know why. # [phone]

## 2018-03-17 NOTE — Progress Notes (Signed)
Office Visit Note   Patient: Shane [name]           Date of Birth: Dec 18, 1970           MRN: [id number] Visit Date: 03/16/2018              Requested by: Charlott Rakes, MD [place], [place] [address] PCP: Charlott Rakes, MD  Chief Complaint  Patient presents with  . Right Foot - Routine Post Op      HPI: Patient is a 47 year old male who is seen for postoperative follow-up following right first ray amputation on 02/27/2018.  He initially had the VAC dressing on but this was removed during his more recent hospitalization for ischemic gastritis with gastric ulceration.  He is recovering well from this and is been discharged home.  He reports that he has not been changing the dressing much on the right foot, but trying to stay off of it.  He is concerned that he is now developing some cracking at the base the plantar surface of the right second toe.  He reports pain from this.  He reports no significant drainage from the incisional area of the right foot.  He reports no swelling of the right foot and reports he is trying to keep it elevated as much as possible.  Assessment & Plan: Visit Diagnoses:  1. 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 Diabetic polyneuropathy associated with type 2 diabetes mellitus ([place])   3. CKD (chronic kidney disease) stage 3, GFR 30-59 ml/min (HCC)     Plan: Bactroban ointment to the base of the right second toe cracked area, daily dressing changes to the right second toe and the right first ray amputation incision and Ace wrapping for edema control.  Continue nonweightbearing over the right lower extremity as much as possible.  Continue elevation of the right lower extremity as much as possible.  Continue protein supplementation for healing.  Follow-up in 1 week.  Follow-Up Instructions: Return in about 1 week (around 03/23/2018).   Ortho Exam  Patient is alert, oriented, no adenopathy, well-dressed,  normal affect, normal respiratory effort. The right foot first ray incision is intact with sutures in place.  There is scant serous drainage.  No signs of cellulitis.  No erythema.  Minimal edema.  He does have superficial skin cracking about the base of the second toe on the plantar surface but no signs of cellulitis here no odor.  He has palpable dorsalis pedis pedal pulse.  Imaging: No results found. No images are attached to the encounter.  Labs: Lab Results  Component Value Date   HGBA1C 7.5 (H) 03/08/2018   HGBA1C 7.3 (H) 02/20/2018   HGBA1C 9.4 (A) 11/20/2017   ESRSEDRATE 104 (H) 02/22/2018   ESRSEDRATE 112 (H) 02/18/2018   ESRSEDRATE 45 (H) 01/10/2018   CRP 2.4 (H) 02/22/2018   CRP 7.3 (H) 02/18/2018   CRP 1.2 (H) 01/10/2018   REPTSTATUS 03/07/2018 FINAL 03/02/2018   GRAMSTAIN  02/18/2018    NO SQUAMOUS EPITHELIAL CELLS SEEN NO WBC SEEN MODERATE GRAM POSITIVE COCCI    CULT  03/02/2018    NO GROWTH 5 DAYS Performed at [facility], [place] [address] [place],  [address]    [place] 02/18/2018     Lab Results  Component Value Date   ALBUMIN 2.3 (L) 03/10/2018   ALBUMIN 2.2 (L) 03/09/2018   ALBUMIN 2.2 (L) 03/08/2018  PREALBUMIN 30.7 12/31/2015   PREALBUMIN 34.1 12/02/2015    Body mass index is 32.14 kg/m.  Orders:  No orders of the defined types were placed in this encounter.  Meds ordered this encounter  Medications  . mupirocin ointment (BACTROBAN) 2 %    Sig: Apply 1 application topically daily. Apply to right 2nd toe and incisional area daily    Dispense:  22 g    Refill:  0     Procedures: No procedures performed  Clinical Data: No additional findings.  ROS:  All other systems negative, except as noted in the HPI. Review of Systems  Objective: Vital Signs: Ht 5\' 7"  (1.702 m)   Wt 205 lb 3.2 oz (93.1 kg)   BMI 32.14 kg/m   Specialty Comments:  No specialty comments available.  PMFS History: Patient  Active Problem List   Diagnosis Date Noted  . Ischemic disease of gut ([place])   . Epigastric pain   . Sepsis ([place]) 03/02/2018  . Acute gastritis due to embolic ischemia after toe amputation 03/02/2018  . 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 Acute osteomyelitis of toe, right ([place])   . Hypoxia   . Abscess of right foot 01/19/2018  . Depression   . PAD (peripheral artery disease) ([place])   . Midfoot skin ulcer, right, limited to breakdown of skin ([place]) 08/14/2017  . Hypertension, uncontrolled 08/08/2017  . Cellulitis of foot, right 08/07/2017  . Bulging lumbar disc 07/04/2017  . Weakness of right lower extremity 05/19/2017  . Cellulitis 05/18/2017  . Achilles tendon contracture, left 01/14/2017  . Gingivitis 11/14/2016  . Abdominal pain 11/14/2016  . Achilles tendon contracture, right 09/17/2016  . Pain and swelling of left lower leg 09/16/2016  . CKD (chronic kidney disease) stage 3, GFR 30-59 ml/min (HCC) 08/27/2016  . Type 2 diabetes mellitus without complication, with long-term current use of insulin ([place]) 07/22/2016  . Constipation 07/02/2016  . S/P transmetatarsal amputation of foot, left ([place]) 07/02/2016  . Shortness of breath 07/02/2016  . Diabetic gastroparesis ([place]) 05/30/2016  . Buzzing in ear, right 03/26/2016  . Joint pain 03/11/2016  . Weakness 03/08/2016  . Dizziness 03/03/2016  . Chronic diastolic CHF (congestive heart failure), NYHA class 1 ([place]) 12/30/2015  . Diabetes mellitus type 2, uncontrolled, with complications ([place]) 38/46/6599  . Depression with anxiety 12/01/2015  . Pain in the chest 12/01/2015  . Insomnia 11/21/2015  . GERD (gastroesophageal reflux disease) 08/18/2015  . Erectile dysfunction 07/04/2015  . Symptomatic anemia 04/24/2015  . AKI (acute kidney injury) ([place]) 04/24/2015  . Left arm weakness 04/02/2015  . Sensory disturbance 04/02/2015  . Essential hypertension 04/02/2015  . Hemispheric carotid  artery syndrome   . HLD (hyperlipidemia)   . Headache    Past Medical History:  Diagnosis Date  . Acute gastritis due to embolic ischemia after toe amputation 03/02/2018  . Anemia   . Asthma    as a child  . Bipolar disorder ([place])    pt denies this   . Chest pain    a. 2015 Reportedly normal stress test in [place].  [place] Kitchen Chronic diastolic CHF (congestive heart failure) (HCC)    a.03/2015 Echo: EF 55-60%, Gr 1 DD, mild MR, triv PR.  . Depression   . Depression with anxiety   . Dyspnea    with exertion  . GERD (gastroesophageal reflux disease)   . Glaucoma   . Hyperlipidemia   . Hypertension  a. 08/2014 Admitted with hypertensive urgency.  . Insomnia   . Internal carotid artery stenosis   . Lower GI bleed 07/04/2015  . Neuropathy   . Refusal of blood transfusions as patient is Jehovah's Witness   . TIA (transient ischemic attack) 08/2014; 03/2015   a. 08/2014 in setting of hypertensive urgency.  . Type II diabetes mellitus ([place])    started insulin spring 2016, Type II  . Vitamin D deficiency spring 2016    Family History  Problem Relation Age of Onset  . Hypertension Mother   . Diabetes Mother   . Hyperlipidemia Mother   . Heart disease Mother        s/p pacemaker  . Diabetes Father   . Hypertension Father   . Stroke Father   . Heart attack Father        first MI @ 50.  . Depression Father   . Dementia Father   . Stroke Brother   . ADD / ADHD Brother   . Anxiety disorder Brother   . Bipolar disorder Brother   . OCD Brother   . Sexual abuse Brother     Past Surgical History:  Procedure Laterality Date  . AMPUTATION Left 08/03/2015   Procedure: AMPUTATION LEFT GREAT TOE;  Surgeon: Newt Minion, MD;  Location: [facility];  Service: Orthopedics;  Laterality: Left;  . AMPUTATION Left 12/02/2015   Procedure: amputation of left 2nd digit  . AMPUTATION Left 04/10/2016   Procedure: Left 2nd Toe Amputation at MTP Joint;  Surgeon: Newt Minion, MD;  Location: [facility];  Service:  Orthopedics;  Laterality: Left;  . AMPUTATION Left 06/09/2016   Procedure: AMPUTATION LEFT THIRD TOE;  Surgeon: [name], MD;  Location: [facility];  Service: Orthopedics;  Laterality: Left;  . AMPUTATION Left 06/26/2016   Procedure: Left Foot Transmetatarsal Amputation;  Surgeon: Newt Minion, MD;  Location: [facility];  Service: Orthopedics;  Laterality: Left;  . AMPUTATION Right 02/27/2018   Procedure: RIGHT FOOT 1ST RAY AMPUTATION;  Surgeon: Newt Minion, MD;  Location: [facility];  Service: Orthopedics;  Laterality: Right;  . APPLICATION OF WOUND VAC Left 12/19/2015   Procedure: APPLICATION OF WOUND VAC;  Surgeon: Meredith Pel, MD;  Location: [facility];  Service: Orthopedics;  Laterality: Left;  . BIOPSY  03/07/2018   Procedure: BIOPSY;  Surgeon: Gatha Mayer, MD;  Location: [facility];  Service: Endoscopy;;  . CIRCUMCISION    . COLONOSCOPY N/A 01/22/2016   Procedure: COLONOSCOPY;  Surgeon: Gatha Mayer, MD;  Location: [facility];  Service: Endoscopy;  Laterality: N/A;  . ESOPHAGOGASTRODUODENOSCOPY N/A 01/19/2016   Procedure: ESOPHAGOGASTRODUODENOSCOPY (EGD);  Surgeon: Manus Gunning, MD;  Location: [facility];  Service: Gastroenterology;  Laterality: N/A;  . ESOPHAGOGASTRODUODENOSCOPY N/A 01/22/2016   Procedure: ESOPHAGOGASTRODUODENOSCOPY (EGD);  Surgeon: Gatha Mayer, MD;  [facility] ENDOSCOPY;  Service: Endoscopy;  Laterality: N/A;  . ESOPHAGOGASTRODUODENOSCOPY N/A 03/07/2018   Procedure: ESOPHAGOGASTRODUODENOSCOPY (EGD);  Surgeon: Gatha Mayer, MD;  Location: [facility] ENDOSCOPY;  Service: Endoscopy;  Laterality: N/A;  . I&D EXTREMITY Left 12/02/2015   Procedure: IRRIGATION AND DEBRIDEMENT OF FOOT; LEFT SECOND TOE AMPUTATION;  Surgeon: Meredith Pel, MD;  Location: [facility];  Service: Orthopedics;  Laterality: Left;  . I&D EXTREMITY Left 12/19/2015   Procedure: I & D LEFT FOOT WITH BEADS ;  Surgeon: Meredith Pel, MD;  Location: [place];  Service: Orthopedics;  Laterality: Left;    . I&D EXTREMITY Right 01/17/2016  Procedure: IRRIGATION AND DEBRIDEMENT RIGHT FOOT;  Surgeon: Newt Minion, MD;  Location: [facility];  Service: Orthopedics;  Laterality: Right;  . I&D EXTREMITY Right 01/21/2018   Procedure: IRRIGATION AND DEBRIDEMENT RIGHT FOOT;  Surgeon: Newt Minion, MD;  Location: [facility];  Service: Orthopedics;  Laterality: Right;  . INCISION AND DRAINAGE FOOT Right 01/17/2016  . INGUINAL HERNIA REPAIR Bilateral ~ 1983- ~ 1986  . TONSILLECTOMY  ~ 57   Social History   Occupational History  . Occupation: disabled  Tobacco Use  . Smoking status: Never Smoker  . Smokeless tobacco: Never Used  Substance and Sexual Activity  . Alcohol use: No    Alcohol/week: 0.0 standard drinks  . Drug use: No  . Sexual activity: Not Currently

## 2018-03-17 NOTE — Telephone Encounter (Signed)
[facility] for home heath visits, routine wound care

## 2018-03-17 NOTE — Telephone Encounter (Signed)
Wells Guiles, nurse at [facility] is requesting skilled nursing orders 1 week 1 2 week 2 2 PRN visits    And would like to know if there are any changes to  wound care?   CB # [phone]

## 2018-03-18 ENCOUNTER — Telehealth (INDEPENDENT_AMBULATORY_CARE_PROVIDER_SITE_OTHER): Payer: Self-pay | Admitting: Orthopedic Surgery

## 2018-03-18 NOTE — Telephone Encounter (Signed)
Shane [name] with [facility] left a message stating that she needs the skilled nursing orders signed in order for her to see the patient for the following:  1x a week for 1 week 2x a week for 2 weeks and 2 PRN visits  CB#[phone].  Thank you.

## 2018-03-18 NOTE — Telephone Encounter (Signed)
This has been done.

## 2018-03-18 NOTE — Telephone Encounter (Signed)
Called and sw rebecca to advise verbal ok for HHN orders but also advised that pt advised that he is moving to [place] so ok for orders while pt is still here. Advised to call with any questions or changes.

## 2018-03-19 MED FILL — AMLODIPINE BESYLATE 5 MG TA: 5 | 30 days supply | Qty: 30 | Fill #1

## 2018-03-21 DIAGNOSIS — E1140 Type 2 diabetes mellitus with diabetic neuropathy, unspecified: Secondary | ICD-10-CM | POA: Diagnosis not present

## 2018-03-21 DIAGNOSIS — D649 Anemia, unspecified: Secondary | ICD-10-CM | POA: Diagnosis not present

## 2018-03-21 DIAGNOSIS — F319 Bipolar disorder, unspecified: Secondary | ICD-10-CM | POA: Diagnosis not present

## 2018-03-21 DIAGNOSIS — I5032 Chronic diastolic (congestive) heart failure: Secondary | ICD-10-CM | POA: Diagnosis not present

## 2018-03-21 DIAGNOSIS — I110 Hypertensive heart disease with heart failure: Secondary | ICD-10-CM | POA: Diagnosis not present

## 2018-03-21 DIAGNOSIS — F419 Anxiety disorder, unspecified: Secondary | ICD-10-CM | POA: Diagnosis not present

## 2018-03-21 DIAGNOSIS — L02611 Cutaneous abscess of right foot: Secondary | ICD-10-CM | POA: Diagnosis not present

## 2018-03-21 DIAGNOSIS — K219 Gastro-esophageal reflux disease without esophagitis: Secondary | ICD-10-CM | POA: Diagnosis not present

## 2018-03-21 DIAGNOSIS — E785 Hyperlipidemia, unspecified: Secondary | ICD-10-CM | POA: Diagnosis not present

## 2018-03-24 ENCOUNTER — Ambulatory Visit (INDEPENDENT_AMBULATORY_CARE_PROVIDER_SITE_OTHER): Payer: Medicare HMO | Admitting: Orthopedic Surgery

## 2018-03-25 ENCOUNTER — Ambulatory Visit: Payer: Self-pay | Admitting: Family Medicine

## 2018-03-25 DIAGNOSIS — A4901 Methicillin susceptible Staphylococcus aureus infection, unspecified site: Secondary | ICD-10-CM | POA: Diagnosis not present

## 2018-03-25 DIAGNOSIS — L97503 Non-pressure chronic ulcer of other part of unspecified foot with necrosis of muscle: Secondary | ICD-10-CM | POA: Diagnosis not present

## 2018-03-25 DIAGNOSIS — L03115 Cellulitis of right lower limb: Secondary | ICD-10-CM | POA: Diagnosis not present

## 2018-03-25 DIAGNOSIS — E11621 Type 2 diabetes mellitus with foot ulcer: Secondary | ICD-10-CM | POA: Diagnosis not present

## 2018-03-25 DIAGNOSIS — L03116 Cellulitis of left lower limb: Secondary | ICD-10-CM | POA: Diagnosis not present

## 2018-03-25 DIAGNOSIS — L02611 Cutaneous abscess of right foot: Secondary | ICD-10-CM | POA: Diagnosis not present

## 2018-03-25 DIAGNOSIS — Z89432 Acquired absence of left foot: Secondary | ICD-10-CM | POA: Diagnosis not present

## 2018-03-25 DIAGNOSIS — R262 Difficulty in walking, not elsewhere classified: Secondary | ICD-10-CM | POA: Diagnosis not present

## 2018-03-26 ENCOUNTER — Ambulatory Visit: Payer: Self-pay | Admitting: Physician Assistant

## 2018-03-30 ENCOUNTER — Telehealth (INDEPENDENT_AMBULATORY_CARE_PROVIDER_SITE_OTHER): Payer: Self-pay | Admitting: Orthopedic Surgery

## 2018-03-30 DIAGNOSIS — L03115 Cellulitis of right lower limb: Secondary | ICD-10-CM | POA: Diagnosis not present

## 2018-03-30 DIAGNOSIS — G609 Hereditary and idiopathic neuropathy, unspecified: Secondary | ICD-10-CM | POA: Diagnosis not present

## 2018-03-30 DIAGNOSIS — I10 Essential (primary) hypertension: Secondary | ICD-10-CM | POA: Diagnosis not present

## 2018-03-30 DIAGNOSIS — M869 Osteomyelitis, unspecified: Secondary | ICD-10-CM | POA: Diagnosis not present

## 2018-03-30 DIAGNOSIS — S98121A Partial traumatic amputation of right great toe, initial encounter: Secondary | ICD-10-CM | POA: Diagnosis not present

## 2018-03-30 DIAGNOSIS — T8743 Infection of amputation stump, right lower extremity: Secondary | ICD-10-CM | POA: Diagnosis not present

## 2018-03-30 DIAGNOSIS — Z89411 Acquired absence of right great toe: Secondary | ICD-10-CM | POA: Diagnosis not present

## 2018-03-30 DIAGNOSIS — R600 Localized edema: Secondary | ICD-10-CM | POA: Diagnosis not present

## 2018-03-30 DIAGNOSIS — E11621 Type 2 diabetes mellitus with foot ulcer: Secondary | ICD-10-CM | POA: Diagnosis not present

## 2018-03-30 DIAGNOSIS — M7989 Other specified soft tissue disorders: Secondary | ICD-10-CM | POA: Diagnosis not present

## 2018-03-30 DIAGNOSIS — E08618 Diabetes mellitus due to underlying condition with other diabetic arthropathy: Secondary | ICD-10-CM | POA: Diagnosis not present

## 2018-03-30 DIAGNOSIS — N179 Acute kidney failure, unspecified: Secondary | ICD-10-CM | POA: Diagnosis not present

## 2018-03-30 DIAGNOSIS — E1169 Type 2 diabetes mellitus with other specified complication: Secondary | ICD-10-CM | POA: Diagnosis not present

## 2018-03-30 DIAGNOSIS — M86671 Other chronic osteomyelitis, right ankle and foot: Secondary | ICD-10-CM | POA: Diagnosis not present

## 2018-03-30 DIAGNOSIS — E441 Mild protein-calorie malnutrition: Secondary | ICD-10-CM | POA: Diagnosis not present

## 2018-03-30 DIAGNOSIS — E1140 Type 2 diabetes mellitus with diabetic neuropathy, unspecified: Secondary | ICD-10-CM | POA: Diagnosis not present

## 2018-03-30 DIAGNOSIS — M86171 Other acute osteomyelitis, right ankle and foot: Secondary | ICD-10-CM | POA: Diagnosis not present

## 2018-03-30 DIAGNOSIS — I129 Hypertensive chronic kidney disease with stage 1 through stage 4 chronic kidney disease, or unspecified chronic kidney disease: Secondary | ICD-10-CM | POA: Diagnosis not present

## 2018-03-30 DIAGNOSIS — S91201A Unspecified open wound of right great toe with damage to nail, initial encounter: Secondary | ICD-10-CM | POA: Diagnosis not present

## 2018-03-30 DIAGNOSIS — F329 Major depressive disorder, single episode, unspecified: Secondary | ICD-10-CM | POA: Diagnosis not present

## 2018-03-30 NOTE — Telephone Encounter (Signed)
A primary care dr could remove sutures and if incision is closed he doesn't need an ortho f/u

## 2018-03-30 NOTE — Telephone Encounter (Signed)
Patient called advised he moved to [place] and he still have the sutures in. Patient said he had surgery 02/27/18. Patient said he will not see his PCP until 04/17/18. Patient asked what should he do? The number to contact patient is [phone]

## 2018-03-30 NOTE — Telephone Encounter (Signed)
I s/w patient and advised him he should get established with an ortho in [place].  He says that he has to see his PCP has to see him and refer him to ortho.  I told him that he needs to see PCP ASAP so he can get in with ortho ASAP to have stitches removed. FYI

## 2018-03-31 NOTE — Telephone Encounter (Signed)
IC patient and discussed.

## 2018-04-04 DIAGNOSIS — A499 Bacterial infection, unspecified: Secondary | ICD-10-CM | POA: Diagnosis not present

## 2018-04-07 DIAGNOSIS — M01X71 Direct infection of right ankle and foot in infectious and parasitic diseases classified elsewhere: Secondary | ICD-10-CM | POA: Diagnosis not present

## 2018-04-07 DIAGNOSIS — A499 Bacterial infection, unspecified: Secondary | ICD-10-CM | POA: Diagnosis not present

## 2018-04-09 DIAGNOSIS — L03115 Cellulitis of right lower limb: Secondary | ICD-10-CM | POA: Diagnosis not present

## 2018-04-09 DIAGNOSIS — E1165 Type 2 diabetes mellitus with hyperglycemia: Secondary | ICD-10-CM | POA: Diagnosis not present

## 2018-04-09 DIAGNOSIS — M869 Osteomyelitis, unspecified: Secondary | ICD-10-CM | POA: Diagnosis not present

## 2018-04-09 DIAGNOSIS — D1632 Benign neoplasm of short bones of left lower limb: Secondary | ICD-10-CM | POA: Diagnosis not present

## 2018-04-09 DIAGNOSIS — I509 Heart failure, unspecified: Secondary | ICD-10-CM | POA: Diagnosis not present

## 2018-04-09 DIAGNOSIS — M79673 Pain in unspecified foot: Secondary | ICD-10-CM | POA: Diagnosis not present

## 2018-04-09 DIAGNOSIS — R600 Localized edema: Secondary | ICD-10-CM | POA: Diagnosis not present

## 2018-04-09 DIAGNOSIS — E1141 Type 2 diabetes mellitus with diabetic mononeuropathy: Secondary | ICD-10-CM | POA: Diagnosis not present

## 2018-04-09 DIAGNOSIS — J9601 Acute respiratory failure with hypoxia: Secondary | ICD-10-CM | POA: Diagnosis not present

## 2018-04-09 DIAGNOSIS — J810 Acute pulmonary edema: Secondary | ICD-10-CM | POA: Diagnosis not present

## 2018-04-09 DIAGNOSIS — M868X9 Other osteomyelitis, unspecified sites: Secondary | ICD-10-CM | POA: Diagnosis not present

## 2018-04-09 DIAGNOSIS — M7989 Other specified soft tissue disorders: Secondary | ICD-10-CM | POA: Diagnosis not present

## 2018-04-09 DIAGNOSIS — Z01818 Encounter for other preprocedural examination: Secondary | ICD-10-CM | POA: Diagnosis not present

## 2018-04-09 DIAGNOSIS — Z743 Need for continuous supervision: Secondary | ICD-10-CM | POA: Diagnosis not present

## 2018-04-09 DIAGNOSIS — Z794 Long term (current) use of insulin: Secondary | ICD-10-CM | POA: Diagnosis not present

## 2018-04-09 DIAGNOSIS — L0390 Cellulitis, unspecified: Secondary | ICD-10-CM | POA: Diagnosis not present

## 2018-04-09 DIAGNOSIS — Z89421 Acquired absence of other right toe(s): Secondary | ICD-10-CM | POA: Diagnosis not present

## 2018-04-09 DIAGNOSIS — G603 Idiopathic progressive neuropathy: Secondary | ICD-10-CM | POA: Diagnosis not present

## 2018-04-09 DIAGNOSIS — D638 Anemia in other chronic diseases classified elsewhere: Secondary | ICD-10-CM | POA: Diagnosis not present

## 2018-04-09 DIAGNOSIS — M86171 Other acute osteomyelitis, right ankle and foot: Secondary | ICD-10-CM | POA: Diagnosis not present

## 2018-04-09 DIAGNOSIS — M01X71 Direct infection of right ankle and foot in infectious and parasitic diseases classified elsewhere: Secondary | ICD-10-CM | POA: Diagnosis not present

## 2018-04-09 DIAGNOSIS — M86671 Other chronic osteomyelitis, right ankle and foot: Secondary | ICD-10-CM | POA: Diagnosis not present

## 2018-04-09 DIAGNOSIS — L601 Onycholysis: Secondary | ICD-10-CM | POA: Diagnosis not present

## 2018-04-09 DIAGNOSIS — N189 Chronic kidney disease, unspecified: Secondary | ICD-10-CM | POA: Diagnosis not present

## 2018-04-09 DIAGNOSIS — Z136 Encounter for screening for cardiovascular disorders: Secondary | ICD-10-CM | POA: Diagnosis not present

## 2018-04-09 DIAGNOSIS — Z87448 Personal history of other diseases of urinary system: Secondary | ICD-10-CM | POA: Diagnosis not present

## 2018-04-09 DIAGNOSIS — A499 Bacterial infection, unspecified: Secondary | ICD-10-CM | POA: Diagnosis not present

## 2018-04-09 DIAGNOSIS — I130 Hypertensive heart and chronic kidney disease with heart failure and stage 1 through stage 4 chronic kidney disease, or unspecified chronic kidney disease: Secondary | ICD-10-CM | POA: Diagnosis not present

## 2018-04-09 DIAGNOSIS — N179 Acute kidney failure, unspecified: Secondary | ICD-10-CM | POA: Diagnosis not present

## 2018-04-09 DIAGNOSIS — E11621 Type 2 diabetes mellitus with foot ulcer: Secondary | ICD-10-CM | POA: Diagnosis not present

## 2018-04-09 DIAGNOSIS — T8140XA Infection following a procedure, unspecified, initial encounter: Secondary | ICD-10-CM | POA: Diagnosis not present

## 2018-04-09 DIAGNOSIS — G609 Hereditary and idiopathic neuropathy, unspecified: Secondary | ICD-10-CM | POA: Diagnosis not present

## 2018-04-09 DIAGNOSIS — S91201A Unspecified open wound of right great toe with damage to nail, initial encounter: Secondary | ICD-10-CM | POA: Diagnosis not present

## 2018-04-09 DIAGNOSIS — E1169 Type 2 diabetes mellitus with other specified complication: Secondary | ICD-10-CM | POA: Diagnosis not present

## 2018-04-12 DIAGNOSIS — A499 Bacterial infection, unspecified: Secondary | ICD-10-CM | POA: Diagnosis not present

## 2018-09-26 NOTE — Telephone Encounter (Signed)
error 

## 2021-06-13 ENCOUNTER — Encounter: Payer: Self-pay | Admitting: Gastroenterology
